# Patient Record
Sex: Female | Born: 1955 | Race: Black or African American | Hispanic: No | Smoking: Former smoker
Health system: Southern US, Community
[De-identification: ages and names within clinical notes are randomized; demographics above are authoritative.]

## PROBLEM LIST (undated history)

## (undated) DIAGNOSIS — K219 Gastro-esophageal reflux disease without esophagitis: Secondary | ICD-10-CM

## (undated) DIAGNOSIS — I10 Essential (primary) hypertension: Secondary | ICD-10-CM

## (undated) DIAGNOSIS — F1010 Alcohol abuse, uncomplicated: Secondary | ICD-10-CM

## (undated) DIAGNOSIS — K859 Acute pancreatitis without necrosis or infection, unspecified: Secondary | ICD-10-CM

## (undated) DIAGNOSIS — R569 Unspecified convulsions: Secondary | ICD-10-CM

## (undated) DIAGNOSIS — E785 Hyperlipidemia, unspecified: Secondary | ICD-10-CM

## (undated) DIAGNOSIS — I5032 Chronic diastolic (congestive) heart failure: Secondary | ICD-10-CM

## (undated) DIAGNOSIS — Z9981 Dependence on supplemental oxygen: Secondary | ICD-10-CM

## (undated) DIAGNOSIS — G4700 Insomnia, unspecified: Secondary | ICD-10-CM

## (undated) DIAGNOSIS — D510 Vitamin B12 deficiency anemia due to intrinsic factor deficiency: Secondary | ICD-10-CM

## (undated) DIAGNOSIS — A159 Respiratory tuberculosis unspecified: Secondary | ICD-10-CM

## (undated) DIAGNOSIS — E781 Pure hyperglyceridemia: Secondary | ICD-10-CM

## (undated) DIAGNOSIS — M1990 Unspecified osteoarthritis, unspecified site: Secondary | ICD-10-CM

## (undated) DIAGNOSIS — J189 Pneumonia, unspecified organism: Secondary | ICD-10-CM

## (undated) DIAGNOSIS — M25561 Pain in right knee: Secondary | ICD-10-CM

## (undated) DIAGNOSIS — D519 Vitamin B12 deficiency anemia, unspecified: Secondary | ICD-10-CM

## (undated) DIAGNOSIS — M2550 Pain in unspecified joint: Secondary | ICD-10-CM

## (undated) DIAGNOSIS — N289 Disorder of kidney and ureter, unspecified: Secondary | ICD-10-CM

## (undated) DIAGNOSIS — R6251 Failure to thrive (child): Secondary | ICD-10-CM

## (undated) DIAGNOSIS — J90 Pleural effusion, not elsewhere classified: Secondary | ICD-10-CM

## (undated) DIAGNOSIS — R8789 Other abnormal findings in specimens from female genital organs: Secondary | ICD-10-CM

## (undated) DIAGNOSIS — S065X9A Traumatic subdural hemorrhage with loss of consciousness of unspecified duration, initial encounter: Secondary | ICD-10-CM

## (undated) DIAGNOSIS — N183 Chronic kidney disease, stage 3 unspecified: Secondary | ICD-10-CM

## (undated) DIAGNOSIS — Z8719 Personal history of other diseases of the digestive system: Secondary | ICD-10-CM

## (undated) DIAGNOSIS — D696 Thrombocytopenia, unspecified: Secondary | ICD-10-CM

## (undated) DIAGNOSIS — D539 Nutritional anemia, unspecified: Secondary | ICD-10-CM

## (undated) DIAGNOSIS — R160 Hepatomegaly, not elsewhere classified: Secondary | ICD-10-CM

## (undated) DIAGNOSIS — S42213A Unspecified displaced fracture of surgical neck of unspecified humerus, initial encounter for closed fracture: Secondary | ICD-10-CM

## (undated) DIAGNOSIS — Z8669 Personal history of other diseases of the nervous system and sense organs: Secondary | ICD-10-CM

## (undated) DIAGNOSIS — IMO0001 Reserved for inherently not codable concepts without codable children: Secondary | ICD-10-CM

## (undated) DIAGNOSIS — F32A Depression, unspecified: Secondary | ICD-10-CM

## (undated) DIAGNOSIS — IMO0002 Reserved for concepts with insufficient information to code with codable children: Secondary | ICD-10-CM

## (undated) DIAGNOSIS — F329 Major depressive disorder, single episode, unspecified: Secondary | ICD-10-CM

## (undated) HISTORY — DX: Essential (primary) hypertension: I10

## (undated) HISTORY — DX: Hypocalcemia: E83.51

## (undated) HISTORY — DX: Gastro-esophageal reflux disease without esophagitis: K21.9

## (undated) HISTORY — DX: Insomnia, unspecified: G47.00

## (undated) HISTORY — DX: Pain in unspecified joint: M25.50

## (undated) HISTORY — DX: Other abnormal findings in specimens from female genital organs: R87.89

## (undated) HISTORY — DX: Acute pancreatitis without necrosis or infection, unspecified: K85.90

## (undated) HISTORY — DX: Hyperlipidemia, unspecified: E78.5

## (undated) HISTORY — DX: Pure hyperglyceridemia: E78.1

## (undated) HISTORY — DX: Hypomagnesemia: E83.42

## (undated) HISTORY — DX: Pain in right knee: M25.561

## (undated) HISTORY — DX: Personal history of other diseases of the digestive system: Z87.19

## (undated) HISTORY — DX: Vitamin B12 deficiency anemia, unspecified: D51.9

## (undated) HISTORY — DX: Major depressive disorder, single episode, unspecified: F32.9

## (undated) HISTORY — DX: Hepatomegaly, not elsewhere classified: R16.0

## (undated) HISTORY — DX: Unspecified osteoarthritis, unspecified site: M19.90

## (undated) HISTORY — DX: Alcohol abuse, uncomplicated: F10.10

## (undated) HISTORY — DX: Failure to thrive (child): R62.51

## (undated) HISTORY — DX: Traumatic subdural hemorrhage with loss of consciousness of unspecified duration, initial encounter: S06.5X9A

## (undated) HISTORY — PX: EYE SURGERY: SHX253

## (undated) HISTORY — DX: Personal history of other diseases of the nervous system and sense organs: Z86.69

## (undated) HISTORY — DX: Reserved for concepts with insufficient information to code with codable children: IMO0002

## (undated) HISTORY — DX: Vitamin B12 deficiency anemia due to intrinsic factor deficiency: D51.0

## (undated) HISTORY — DX: Thrombocytopenia, unspecified: D69.6

## (undated) HISTORY — DX: Unspecified displaced fracture of surgical neck of unspecified humerus, initial encounter for closed fracture: S42.213A

## (undated) HISTORY — DX: Depression, unspecified: F32.A

## (undated) HISTORY — DX: Nutritional anemia, unspecified: D53.9

---

## 1998-04-10 ENCOUNTER — Ambulatory Visit (HOSPITAL_COMMUNITY): Admission: RE | Admit: 1998-04-10 | Discharge: 1998-04-10 | Payer: Self-pay | Admitting: *Deleted

## 1998-04-10 ENCOUNTER — Encounter: Payer: Self-pay | Admitting: *Deleted

## 1999-08-15 ENCOUNTER — Emergency Department (HOSPITAL_COMMUNITY): Admission: EM | Admit: 1999-08-15 | Discharge: 1999-08-16 | Payer: Self-pay | Admitting: Internal Medicine

## 1999-08-15 ENCOUNTER — Encounter: Payer: Self-pay | Admitting: Internal Medicine

## 1999-08-24 ENCOUNTER — Emergency Department (HOSPITAL_COMMUNITY): Admission: EM | Admit: 1999-08-24 | Discharge: 1999-08-24 | Payer: Self-pay | Admitting: Internal Medicine

## 2000-02-27 ENCOUNTER — Emergency Department (HOSPITAL_COMMUNITY): Admission: EM | Admit: 2000-02-27 | Discharge: 2000-02-27 | Payer: Self-pay | Admitting: Emergency Medicine

## 2000-03-23 ENCOUNTER — Emergency Department (HOSPITAL_COMMUNITY): Admission: EM | Admit: 2000-03-23 | Discharge: 2000-03-23 | Payer: Self-pay | Admitting: Emergency Medicine

## 2001-01-09 ENCOUNTER — Emergency Department (HOSPITAL_COMMUNITY): Admission: EM | Admit: 2001-01-09 | Discharge: 2001-01-09 | Payer: Self-pay | Admitting: Emergency Medicine

## 2002-03-05 ENCOUNTER — Inpatient Hospital Stay (HOSPITAL_COMMUNITY): Admission: EM | Admit: 2002-03-05 | Discharge: 2002-03-07 | Payer: Self-pay | Admitting: Emergency Medicine

## 2002-03-05 ENCOUNTER — Encounter: Payer: Self-pay | Admitting: Emergency Medicine

## 2002-03-06 ENCOUNTER — Encounter: Payer: Self-pay | Admitting: Internal Medicine

## 2002-04-07 ENCOUNTER — Encounter: Admission: RE | Admit: 2002-04-07 | Discharge: 2002-04-07 | Payer: Self-pay | Admitting: Internal Medicine

## 2002-04-29 ENCOUNTER — Emergency Department (HOSPITAL_COMMUNITY): Admission: EM | Admit: 2002-04-29 | Discharge: 2002-04-29 | Payer: Self-pay | Admitting: Emergency Medicine

## 2002-04-29 ENCOUNTER — Encounter: Payer: Self-pay | Admitting: Emergency Medicine

## 2002-06-16 ENCOUNTER — Encounter: Admission: RE | Admit: 2002-06-16 | Discharge: 2002-07-15 | Payer: Self-pay | Admitting: *Deleted

## 2002-06-26 ENCOUNTER — Encounter: Admission: RE | Admit: 2002-06-26 | Discharge: 2002-06-26 | Payer: Self-pay | Admitting: Internal Medicine

## 2003-06-14 ENCOUNTER — Emergency Department (HOSPITAL_COMMUNITY): Admission: AD | Admit: 2003-06-14 | Discharge: 2003-06-14 | Payer: Self-pay | Admitting: Family Medicine

## 2003-06-29 ENCOUNTER — Emergency Department (HOSPITAL_COMMUNITY): Admission: AD | Admit: 2003-06-29 | Discharge: 2003-06-29 | Payer: Self-pay | Admitting: Family Medicine

## 2003-11-29 ENCOUNTER — Emergency Department (HOSPITAL_COMMUNITY): Admission: EM | Admit: 2003-11-29 | Discharge: 2003-11-29 | Payer: Self-pay | Admitting: *Deleted

## 2004-03-09 ENCOUNTER — Emergency Department (HOSPITAL_COMMUNITY): Admission: EM | Admit: 2004-03-09 | Discharge: 2004-03-09 | Payer: Self-pay | Admitting: Family Medicine

## 2004-03-12 ENCOUNTER — Emergency Department (HOSPITAL_COMMUNITY): Admission: EM | Admit: 2004-03-12 | Discharge: 2004-03-12 | Payer: Self-pay | Admitting: Emergency Medicine

## 2004-03-18 ENCOUNTER — Ambulatory Visit: Payer: Self-pay | Admitting: Internal Medicine

## 2004-04-20 ENCOUNTER — Ambulatory Visit: Payer: Self-pay | Admitting: Internal Medicine

## 2004-04-20 ENCOUNTER — Ambulatory Visit (HOSPITAL_COMMUNITY): Admission: RE | Admit: 2004-04-20 | Discharge: 2004-04-20 | Payer: Self-pay | Admitting: Internal Medicine

## 2004-07-18 ENCOUNTER — Ambulatory Visit: Payer: Self-pay | Admitting: Internal Medicine

## 2004-08-02 ENCOUNTER — Ambulatory Visit (HOSPITAL_COMMUNITY): Admission: RE | Admit: 2004-08-02 | Discharge: 2004-08-02 | Payer: Self-pay | Admitting: Internal Medicine

## 2004-08-12 ENCOUNTER — Encounter: Admission: RE | Admit: 2004-08-12 | Discharge: 2004-08-12 | Payer: Self-pay | Admitting: Internal Medicine

## 2004-10-20 ENCOUNTER — Ambulatory Visit: Payer: Self-pay | Admitting: Internal Medicine

## 2004-10-20 ENCOUNTER — Ambulatory Visit (HOSPITAL_COMMUNITY): Admission: RE | Admit: 2004-10-20 | Discharge: 2004-10-20 | Payer: Self-pay | Admitting: Internal Medicine

## 2004-11-17 ENCOUNTER — Ambulatory Visit: Payer: Self-pay | Admitting: Internal Medicine

## 2005-03-17 ENCOUNTER — Encounter: Admission: RE | Admit: 2005-03-17 | Discharge: 2005-03-17 | Payer: Self-pay | Admitting: Internal Medicine

## 2005-03-30 ENCOUNTER — Emergency Department (HOSPITAL_COMMUNITY): Admission: EM | Admit: 2005-03-30 | Discharge: 2005-03-30 | Payer: Self-pay | Admitting: Family Medicine

## 2005-04-05 ENCOUNTER — Ambulatory Visit: Payer: Self-pay | Admitting: Internal Medicine

## 2005-11-29 ENCOUNTER — Ambulatory Visit: Payer: Self-pay | Admitting: Internal Medicine

## 2005-12-13 ENCOUNTER — Ambulatory Visit: Payer: Self-pay | Admitting: Hospitalist

## 2005-12-22 ENCOUNTER — Ambulatory Visit: Payer: Self-pay | Admitting: Internal Medicine

## 2006-01-03 ENCOUNTER — Ambulatory Visit: Payer: Self-pay | Admitting: Internal Medicine

## 2006-01-03 ENCOUNTER — Ambulatory Visit (HOSPITAL_COMMUNITY): Admission: RE | Admit: 2006-01-03 | Discharge: 2006-01-03 | Payer: Self-pay | Admitting: Internal Medicine

## 2006-01-09 ENCOUNTER — Ambulatory Visit: Payer: Self-pay | Admitting: Hospitalist

## 2006-01-16 ENCOUNTER — Encounter (INDEPENDENT_AMBULATORY_CARE_PROVIDER_SITE_OTHER): Payer: Self-pay | Admitting: Cardiology

## 2006-01-16 ENCOUNTER — Ambulatory Visit (HOSPITAL_COMMUNITY): Admission: RE | Admit: 2006-01-16 | Discharge: 2006-01-16 | Payer: Self-pay | Admitting: Internal Medicine

## 2006-01-25 ENCOUNTER — Encounter: Admission: RE | Admit: 2006-01-25 | Discharge: 2006-01-25 | Payer: Self-pay | Admitting: Internal Medicine

## 2006-01-31 ENCOUNTER — Ambulatory Visit: Payer: Self-pay | Admitting: Hospitalist

## 2006-03-02 ENCOUNTER — Ambulatory Visit: Payer: Self-pay | Admitting: Internal Medicine

## 2006-03-06 ENCOUNTER — Emergency Department (HOSPITAL_COMMUNITY): Admission: EM | Admit: 2006-03-06 | Discharge: 2006-03-06 | Payer: Self-pay | Admitting: Emergency Medicine

## 2006-03-19 ENCOUNTER — Ambulatory Visit: Payer: Self-pay | Admitting: Internal Medicine

## 2006-04-02 ENCOUNTER — Ambulatory Visit: Payer: Self-pay | Admitting: Internal Medicine

## 2006-05-01 ENCOUNTER — Ambulatory Visit: Payer: Self-pay | Admitting: Internal Medicine

## 2006-05-04 ENCOUNTER — Emergency Department (HOSPITAL_COMMUNITY): Admission: EM | Admit: 2006-05-04 | Discharge: 2006-05-04 | Payer: Self-pay | Admitting: Emergency Medicine

## 2006-05-10 ENCOUNTER — Ambulatory Visit: Payer: Self-pay | Admitting: Internal Medicine

## 2006-05-10 ENCOUNTER — Encounter (INDEPENDENT_AMBULATORY_CARE_PROVIDER_SITE_OTHER): Payer: Self-pay | Admitting: Internal Medicine

## 2006-05-10 LAB — CONVERTED CEMR LAB
Alkaline Phosphatase: 131 units/L — ABNORMAL HIGH (ref 39–117)
BUN: 7 mg/dL (ref 6–23)
Candida species: NEGATIVE
Eosinophils Relative: 2 % (ref 0–4)
Glucose, Bld: 142 mg/dL — ABNORMAL HIGH (ref 70–99)
HCT: 32.5 % — ABNORMAL LOW (ref 34.4–43.3)
HIV 1 RNA Quant: <50 copies/mL (ref <50)
Hemoglobin: 10.9 g/dL — ABNORMAL LOW (ref 11.7–14.8)
Lymphocytes Relative: 30 % (ref 15–43)
Lymphs Abs: 1.6 10*3/uL (ref 0.8–3.1)
Monocytes Absolute: 1 10*3/uL — ABNORMAL HIGH (ref 0.2–0.7)
RDW: 13.1 % (ref 11.5–15.3)
Total Bilirubin: 0.3 mg/dL (ref 0.3–1.2)
Trichomonal Vaginitis: NEGATIVE

## 2006-06-01 ENCOUNTER — Ambulatory Visit: Payer: Self-pay | Admitting: Internal Medicine

## 2006-07-02 ENCOUNTER — Ambulatory Visit: Payer: Self-pay | Admitting: Internal Medicine

## 2006-07-02 DIAGNOSIS — R160 Hepatomegaly, not elsewhere classified: Secondary | ICD-10-CM

## 2006-07-02 DIAGNOSIS — F1010 Alcohol abuse, uncomplicated: Secondary | ICD-10-CM | POA: Insufficient documentation

## 2006-08-01 ENCOUNTER — Encounter (INDEPENDENT_AMBULATORY_CARE_PROVIDER_SITE_OTHER): Payer: Self-pay | Admitting: *Deleted

## 2006-08-01 ENCOUNTER — Ambulatory Visit: Payer: Self-pay | Admitting: Internal Medicine

## 2006-08-07 ENCOUNTER — Emergency Department (HOSPITAL_COMMUNITY): Admission: EM | Admit: 2006-08-07 | Discharge: 2006-08-07 | Payer: Self-pay | Admitting: Emergency Medicine

## 2006-09-12 ENCOUNTER — Ambulatory Visit: Payer: Self-pay | Admitting: Internal Medicine

## 2006-09-12 DIAGNOSIS — F329 Major depressive disorder, single episode, unspecified: Secondary | ICD-10-CM | POA: Insufficient documentation

## 2006-09-12 DIAGNOSIS — D649 Anemia, unspecified: Secondary | ICD-10-CM

## 2006-09-13 ENCOUNTER — Encounter (INDEPENDENT_AMBULATORY_CARE_PROVIDER_SITE_OTHER): Payer: Self-pay | Admitting: Internal Medicine

## 2006-09-17 ENCOUNTER — Encounter (INDEPENDENT_AMBULATORY_CARE_PROVIDER_SITE_OTHER): Payer: Self-pay | Admitting: Internal Medicine

## 2006-09-17 DIAGNOSIS — D696 Thrombocytopenia, unspecified: Secondary | ICD-10-CM

## 2006-09-17 LAB — CONVERTED CEMR LAB
AST: 61 units/L — ABNORMAL HIGH (ref 0–37)
Albumin: 3.7 g/dL (ref 3.5–5.2)
Alkaline Phosphatase: 116 units/L (ref 39–117)
BUN: 6 mg/dL (ref 6–23)
MCHC: 34.5 g/dL (ref 30.0–36.0)
Potassium: 3.8 meq/L (ref 3.5–5.3)
RDW: 16.3 % — ABNORMAL HIGH (ref 11.5–14.0)
Sodium: 141 meq/L (ref 135–145)
Total Protein: 8.3 g/dL (ref 6.0–8.3)

## 2006-09-18 ENCOUNTER — Encounter (INDEPENDENT_AMBULATORY_CARE_PROVIDER_SITE_OTHER): Payer: Self-pay | Admitting: Internal Medicine

## 2006-09-19 ENCOUNTER — Encounter (INDEPENDENT_AMBULATORY_CARE_PROVIDER_SITE_OTHER): Payer: Self-pay | Admitting: Internal Medicine

## 2006-09-19 ENCOUNTER — Ambulatory Visit: Payer: Self-pay | Admitting: Hospitalist

## 2006-09-19 ENCOUNTER — Ambulatory Visit (HOSPITAL_COMMUNITY): Admission: RE | Admit: 2006-09-19 | Discharge: 2006-09-19 | Payer: Self-pay | Admitting: Internal Medicine

## 2006-09-20 ENCOUNTER — Ambulatory Visit: Payer: Self-pay | Admitting: Internal Medicine

## 2006-09-24 LAB — CONVERTED CEMR LAB
Ferritin: 487 ng/mL — ABNORMAL HIGH (ref 10–291)
Hemoglobin: 10.9 g/dL — ABNORMAL LOW (ref 12.0–15.0)
INR: 1.1 (ref 0.0–1.5)
MCHC: 33.8 g/dL (ref 30.0–36.0)
RBC Folate: 137 ng/mL — ABNORMAL LOW (ref 180–600)
RBC: 2.9 M/uL — ABNORMAL LOW (ref 3.87–5.11)
TIBC: 255 ug/dL (ref 250–470)
Vitamin B-12: 562 pg/mL (ref 211–911)
WBC: 4 10*3/uL (ref 4.0–10.5)

## 2006-10-10 ENCOUNTER — Ambulatory Visit: Payer: Self-pay | Admitting: *Deleted

## 2006-11-09 ENCOUNTER — Ambulatory Visit: Payer: Self-pay | Admitting: Internal Medicine

## 2006-11-27 ENCOUNTER — Emergency Department (HOSPITAL_COMMUNITY): Admission: EM | Admit: 2006-11-27 | Discharge: 2006-11-27 | Payer: Self-pay | Admitting: Family Medicine

## 2006-12-13 ENCOUNTER — Ambulatory Visit: Payer: Self-pay | Admitting: Internal Medicine

## 2007-01-14 ENCOUNTER — Ambulatory Visit: Payer: Self-pay | Admitting: *Deleted

## 2007-02-13 ENCOUNTER — Ambulatory Visit: Payer: Self-pay | Admitting: Internal Medicine

## 2007-02-26 ENCOUNTER — Telehealth (INDEPENDENT_AMBULATORY_CARE_PROVIDER_SITE_OTHER): Payer: Self-pay | Admitting: *Deleted

## 2007-03-04 ENCOUNTER — Encounter (INDEPENDENT_AMBULATORY_CARE_PROVIDER_SITE_OTHER): Payer: Self-pay | Admitting: Internal Medicine

## 2007-03-04 ENCOUNTER — Ambulatory Visit: Payer: Self-pay | Admitting: Internal Medicine

## 2007-03-07 ENCOUNTER — Ambulatory Visit: Payer: Self-pay | Admitting: Infectious Diseases

## 2007-03-11 LAB — CONVERTED CEMR LAB
ALT: 49 units/L — ABNORMAL HIGH (ref 0–35)
AST: 180 units/L — ABNORMAL HIGH (ref 0–37)
Basophils Absolute: 0 10*3/uL (ref 0.0–0.1)
Basophils Relative: 1 % (ref 0–1)
CO2: 24 meq/L (ref 19–32)
Calcium: 8.7 mg/dL (ref 8.4–10.5)
Chloride: 109 meq/L (ref 96–112)
Hemoglobin: 10.6 g/dL — ABNORMAL LOW (ref 12.0–15.0)
Lymphocytes Relative: 52 % — ABNORMAL HIGH (ref 12–46)
Monocytes Absolute: 0.5 10*3/uL (ref 0.2–0.7)
Neutro Abs: 1.8 10*3/uL (ref 1.7–7.7)
Neutrophils Relative %: 36 % — ABNORMAL LOW (ref 43–77)
Platelets: 195 10*3/uL (ref 150–400)
Potassium: 4.4 meq/L (ref 3.5–5.3)
RBC Folate: 272 ng/mL (ref 180–600)
RDW: 13.1 % (ref 11.5–14.0)
Sodium: 140 meq/L (ref 135–145)
Total Protein: 8.2 g/dL (ref 6.0–8.3)

## 2007-03-13 ENCOUNTER — Ambulatory Visit: Payer: Self-pay | Admitting: Infectious Diseases

## 2007-03-19 ENCOUNTER — Encounter: Admission: RE | Admit: 2007-03-19 | Discharge: 2007-03-19 | Payer: Self-pay | Admitting: Internal Medicine

## 2007-04-05 ENCOUNTER — Ambulatory Visit: Payer: Self-pay | Admitting: Internal Medicine

## 2007-04-05 ENCOUNTER — Encounter (INDEPENDENT_AMBULATORY_CARE_PROVIDER_SITE_OTHER): Payer: Self-pay | Admitting: Internal Medicine

## 2007-04-05 DIAGNOSIS — I129 Hypertensive chronic kidney disease with stage 1 through stage 4 chronic kidney disease, or unspecified chronic kidney disease: Secondary | ICD-10-CM

## 2007-04-05 LAB — CONVERTED CEMR LAB
ALT: 24 units/L (ref 0–35)
AST: 101 units/L — ABNORMAL HIGH (ref 0–37)
Alcohol, Ethyl (B): <5 mg/dL (ref 0–10)
BUN: 3 mg/dL — ABNORMAL LOW (ref 6–23)
Creatinine, Ser: 0.52 mg/dL (ref 0.40–1.20)
Total Bilirubin: 0.8 mg/dL (ref 0.3–1.2)

## 2007-04-12 ENCOUNTER — Ambulatory Visit: Payer: Self-pay | Admitting: *Deleted

## 2007-05-05 ENCOUNTER — Emergency Department (HOSPITAL_COMMUNITY): Admission: EM | Admit: 2007-05-05 | Discharge: 2007-05-05 | Payer: Self-pay | Admitting: Family Medicine

## 2007-05-06 ENCOUNTER — Telehealth: Payer: Self-pay | Admitting: *Deleted

## 2007-05-06 ENCOUNTER — Encounter (INDEPENDENT_AMBULATORY_CARE_PROVIDER_SITE_OTHER): Payer: Self-pay | Admitting: Internal Medicine

## 2007-05-06 ENCOUNTER — Ambulatory Visit: Payer: Self-pay | Admitting: Hospitalist

## 2007-05-10 ENCOUNTER — Encounter (INDEPENDENT_AMBULATORY_CARE_PROVIDER_SITE_OTHER): Payer: Self-pay | Admitting: Internal Medicine

## 2007-05-16 ENCOUNTER — Ambulatory Visit: Payer: Self-pay | Admitting: Internal Medicine

## 2007-05-16 ENCOUNTER — Encounter (INDEPENDENT_AMBULATORY_CARE_PROVIDER_SITE_OTHER): Payer: Self-pay | Admitting: Internal Medicine

## 2007-06-12 ENCOUNTER — Ambulatory Visit: Payer: Self-pay | Admitting: Internal Medicine

## 2007-06-12 ENCOUNTER — Encounter (INDEPENDENT_AMBULATORY_CARE_PROVIDER_SITE_OTHER): Payer: Self-pay | Admitting: Internal Medicine

## 2007-06-12 ENCOUNTER — Telehealth (INDEPENDENT_AMBULATORY_CARE_PROVIDER_SITE_OTHER): Payer: Self-pay | Admitting: Internal Medicine

## 2007-06-28 ENCOUNTER — Ambulatory Visit: Payer: Self-pay | Admitting: Internal Medicine

## 2007-06-28 ENCOUNTER — Encounter (INDEPENDENT_AMBULATORY_CARE_PROVIDER_SITE_OTHER): Payer: Self-pay | Admitting: Internal Medicine

## 2007-07-04 LAB — CONVERTED CEMR LAB
AST: 123 units/L — ABNORMAL HIGH (ref 0–37)
Albumin: 3.3 g/dL — ABNORMAL LOW (ref 3.5–5.2)
BUN: 5 mg/dL — ABNORMAL LOW (ref 6–23)
Calcium: <6.5 mg/dL — ABNORMAL LOW (ref 8.4–10.5)
Chloride: 98 meq/L (ref 96–112)
Creatinine, Ser: 0.52 mg/dL (ref 0.40–1.20)
Glucose, Bld: 89 mg/dL (ref 70–99)
HCT: 32.9 % — ABNORMAL LOW (ref 36.0–46.0)
Hemoglobin: 11.1 g/dL — ABNORMAL LOW (ref 12.0–15.0)
MCHC: 33.7 g/dL (ref 30.0–36.0)
Potassium: 3.6 meq/L (ref 3.5–5.3)
RBC: 3.13 M/uL — ABNORMAL LOW (ref 3.87–5.11)
RDW: 13.1 % (ref 11.5–15.5)

## 2007-07-08 ENCOUNTER — Telehealth: Payer: Self-pay | Admitting: *Deleted

## 2007-08-15 ENCOUNTER — Encounter (INDEPENDENT_AMBULATORY_CARE_PROVIDER_SITE_OTHER): Payer: Self-pay | Admitting: Internal Medicine

## 2007-08-15 ENCOUNTER — Ambulatory Visit: Payer: Self-pay | Admitting: *Deleted

## 2007-08-15 ENCOUNTER — Ambulatory Visit (HOSPITAL_COMMUNITY): Admission: RE | Admit: 2007-08-15 | Discharge: 2007-08-15 | Payer: Self-pay | Admitting: *Deleted

## 2007-08-15 LAB — CONVERTED CEMR LAB
ALT: 177 units/L — ABNORMAL HIGH (ref 0–35)
Albumin: 2.9 g/dL — ABNORMAL LOW (ref 3.5–5.2)
Alkaline Phosphatase: 347 units/L — ABNORMAL HIGH (ref 39–117)
CO2: 22 meq/L (ref 19–32)
Eosinophils Relative: 0 % (ref 0–5)
Glucose, Bld: 107 mg/dL — ABNORMAL HIGH (ref 70–99)
Hep B Core Total Ab: NEGATIVE
Lymphocytes Relative: 14 % (ref 12–46)
Lymphs Abs: 3 10*3/uL (ref 0.7–4.0)
MCHC: 34.8 g/dL (ref 30.0–36.0)
MCV: 107.9 fL — ABNORMAL HIGH (ref 78.0–100.0)
Monocytes Absolute: 0.8 10*3/uL (ref 0.1–1.0)
Potassium: 4 meq/L (ref 3.5–5.3)
Prothrombin Time: 13.7 s (ref 11.6–15.2)
RDW: 13.9 % (ref 11.5–15.5)
Sodium: 134 meq/L — ABNORMAL LOW (ref 135–145)
Total Protein: 7.9 g/dL (ref 6.0–8.3)

## 2007-08-16 ENCOUNTER — Ambulatory Visit (HOSPITAL_COMMUNITY): Admission: RE | Admit: 2007-08-16 | Discharge: 2007-08-16 | Payer: Self-pay | Admitting: Infectious Diseases

## 2007-08-16 LAB — CONVERTED CEMR LAB
Ketones, ur: NEGATIVE mg/dL
Nitrite: NEGATIVE
Specific Gravity, Urine: 1.019 (ref 1.005–1.03)
Urobilinogen, UA: 1 (ref 0.0–1.0)
pH: 6.5 (ref 5.0–8.0)

## 2007-08-17 ENCOUNTER — Encounter (INDEPENDENT_AMBULATORY_CARE_PROVIDER_SITE_OTHER): Payer: Self-pay | Admitting: Internal Medicine

## 2007-08-19 DIAGNOSIS — R627 Adult failure to thrive: Secondary | ICD-10-CM | POA: Insufficient documentation

## 2007-08-21 ENCOUNTER — Telehealth: Payer: Self-pay | Admitting: *Deleted

## 2007-08-27 ENCOUNTER — Ambulatory Visit: Payer: Self-pay | Admitting: Internal Medicine

## 2007-08-29 ENCOUNTER — Ambulatory Visit: Payer: Self-pay | Admitting: *Deleted

## 2007-08-29 ENCOUNTER — Encounter (INDEPENDENT_AMBULATORY_CARE_PROVIDER_SITE_OTHER): Payer: Self-pay | Admitting: Internal Medicine

## 2007-08-29 LAB — CONVERTED CEMR LAB
ALT: 63 units/L — ABNORMAL HIGH (ref 0–35)
BUN: 14 mg/dL (ref 6–23)
CO2: 21 meq/L (ref 19–32)
Calcium: 8 mg/dL — ABNORMAL LOW (ref 8.4–10.5)
Creatinine, Ser: 0.92 mg/dL (ref 0.40–1.20)
Glucose, Bld: 162 mg/dL — ABNORMAL HIGH (ref 70–99)
HCT: 30.4 % — ABNORMAL LOW (ref 36.0–46.0)
Hemoglobin: 10.6 g/dL — ABNORMAL LOW (ref 12.0–15.0)
MCV: 107 fL — ABNORMAL HIGH (ref 78.0–100.0)
Platelets: 258 10*3/uL (ref 150–400)
Total Bilirubin: 0.8 mg/dL (ref 0.3–1.2)
WBC: 6.1 10*3/uL (ref 4.0–10.5)

## 2007-09-16 ENCOUNTER — Ambulatory Visit: Payer: Self-pay | Admitting: Internal Medicine

## 2007-10-03 ENCOUNTER — Encounter (INDEPENDENT_AMBULATORY_CARE_PROVIDER_SITE_OTHER): Payer: Self-pay | Admitting: Internal Medicine

## 2007-10-03 ENCOUNTER — Ambulatory Visit: Payer: Self-pay | Admitting: Internal Medicine

## 2007-10-04 LAB — CONVERTED CEMR LAB
BUN: 12 mg/dL (ref 6–23)
CO2: 18 meq/L — ABNORMAL LOW (ref 19–32)
Calcium: 8 mg/dL — ABNORMAL LOW (ref 8.4–10.5)
Chloride: 109 meq/L (ref 96–112)
Creatinine, Ser: 1.17 mg/dL (ref 0.40–1.20)
Glucose, Bld: 146 mg/dL — ABNORMAL HIGH (ref 70–99)
HCT: 35.2 % — ABNORMAL LOW (ref 36.0–46.0)
Hemoglobin: 11.7 g/dL — ABNORMAL LOW (ref 12.0–15.0)
MCV: 105.1 fL — ABNORMAL HIGH (ref 78.0–100.0)
RBC: 3.35 M/uL — ABNORMAL LOW (ref 3.87–5.11)
Total Bilirubin: 0.3 mg/dL (ref 0.3–1.2)
WBC: 6.7 10*3/uL (ref 4.0–10.5)

## 2007-10-16 ENCOUNTER — Ambulatory Visit: Payer: Self-pay | Admitting: *Deleted

## 2007-11-15 ENCOUNTER — Ambulatory Visit: Payer: Self-pay | Admitting: *Deleted

## 2007-11-25 ENCOUNTER — Emergency Department (HOSPITAL_COMMUNITY): Admission: EM | Admit: 2007-11-25 | Discharge: 2007-11-25 | Payer: Self-pay | Admitting: Emergency Medicine

## 2007-11-25 ENCOUNTER — Encounter: Payer: Self-pay | Admitting: Emergency Medicine

## 2007-11-26 ENCOUNTER — Encounter: Payer: Self-pay | Admitting: Emergency Medicine

## 2007-11-26 ENCOUNTER — Inpatient Hospital Stay (HOSPITAL_COMMUNITY): Admission: AD | Admit: 2007-11-26 | Discharge: 2007-12-02 | Payer: Self-pay | Admitting: Infectious Diseases

## 2007-12-02 ENCOUNTER — Telehealth (INDEPENDENT_AMBULATORY_CARE_PROVIDER_SITE_OTHER): Payer: Self-pay | Admitting: Internal Medicine

## 2007-12-04 ENCOUNTER — Encounter (INDEPENDENT_AMBULATORY_CARE_PROVIDER_SITE_OTHER): Payer: Self-pay | Admitting: *Deleted

## 2007-12-04 ENCOUNTER — Ambulatory Visit: Payer: Self-pay | Admitting: Internal Medicine

## 2007-12-04 DIAGNOSIS — N2581 Secondary hyperparathyroidism of renal origin: Secondary | ICD-10-CM | POA: Insufficient documentation

## 2007-12-04 DIAGNOSIS — Z8669 Personal history of other diseases of the nervous system and sense organs: Secondary | ICD-10-CM

## 2007-12-04 LAB — CONVERTED CEMR LAB
CO2: 21 meq/L (ref 19–32)
Calcium: 8.6 mg/dL (ref 8.4–10.5)
Glucose, Bld: 123 mg/dL — ABNORMAL HIGH (ref 70–99)
Potassium: 3.8 meq/L (ref 3.5–5.3)
Sodium: 140 meq/L (ref 135–145)

## 2007-12-06 ENCOUNTER — Ambulatory Visit: Payer: Self-pay | Admitting: Internal Medicine

## 2007-12-06 ENCOUNTER — Inpatient Hospital Stay (HOSPITAL_COMMUNITY): Admission: EM | Admit: 2007-12-06 | Discharge: 2007-12-07 | Payer: Self-pay | Admitting: *Deleted

## 2007-12-07 ENCOUNTER — Encounter (INDEPENDENT_AMBULATORY_CARE_PROVIDER_SITE_OTHER): Payer: Self-pay | Admitting: *Deleted

## 2007-12-12 ENCOUNTER — Telehealth (INDEPENDENT_AMBULATORY_CARE_PROVIDER_SITE_OTHER): Payer: Self-pay | Admitting: Internal Medicine

## 2007-12-25 ENCOUNTER — Ambulatory Visit: Payer: Self-pay | Admitting: Internal Medicine

## 2008-01-01 ENCOUNTER — Ambulatory Visit: Payer: Self-pay | Admitting: Internal Medicine

## 2008-01-31 ENCOUNTER — Ambulatory Visit: Payer: Self-pay | Admitting: Internal Medicine

## 2008-02-04 DIAGNOSIS — S065X9A Traumatic subdural hemorrhage with loss of consciousness of unspecified duration, initial encounter: Secondary | ICD-10-CM

## 2008-02-04 DIAGNOSIS — S065XAA Traumatic subdural hemorrhage with loss of consciousness status unknown, initial encounter: Secondary | ICD-10-CM

## 2008-02-04 HISTORY — DX: Traumatic subdural hemorrhage with loss of consciousness status unknown, initial encounter: S06.5XAA

## 2008-02-04 HISTORY — DX: Traumatic subdural hemorrhage with loss of consciousness of unspecified duration, initial encounter: S06.5X9A

## 2008-02-06 ENCOUNTER — Emergency Department (HOSPITAL_COMMUNITY): Admission: EM | Admit: 2008-02-06 | Discharge: 2008-02-06 | Payer: Self-pay | Admitting: Emergency Medicine

## 2008-02-13 ENCOUNTER — Ambulatory Visit: Payer: Self-pay | Admitting: Internal Medicine

## 2008-02-13 ENCOUNTER — Encounter (INDEPENDENT_AMBULATORY_CARE_PROVIDER_SITE_OTHER): Payer: Self-pay | Admitting: Internal Medicine

## 2008-02-13 ENCOUNTER — Ambulatory Visit (HOSPITAL_COMMUNITY): Admission: RE | Admit: 2008-02-13 | Discharge: 2008-02-13 | Payer: Self-pay | Admitting: Internal Medicine

## 2008-02-13 DIAGNOSIS — I6200 Nontraumatic subdural hemorrhage, unspecified: Secondary | ICD-10-CM

## 2008-02-13 LAB — CONVERTED CEMR LAB
HCT: 33.5 % — ABNORMAL LOW (ref 36.0–46.0)
Hemoglobin: 11.3 g/dL — ABNORMAL LOW (ref 12.0–15.0)
MCHC: 33.8 g/dL (ref 30.0–36.0)
RBC: 3.37 M/uL — ABNORMAL LOW (ref 3.87–5.11)
RDW: 13.9 % (ref 11.5–15.5)

## 2008-02-24 ENCOUNTER — Encounter: Admission: RE | Admit: 2008-02-24 | Discharge: 2008-02-24 | Payer: Self-pay | Admitting: Neurosurgery

## 2008-02-24 ENCOUNTER — Encounter (INDEPENDENT_AMBULATORY_CARE_PROVIDER_SITE_OTHER): Payer: Self-pay | Admitting: Internal Medicine

## 2008-03-02 ENCOUNTER — Ambulatory Visit: Payer: Self-pay | Admitting: Internal Medicine

## 2008-03-02 DIAGNOSIS — K219 Gastro-esophageal reflux disease without esophagitis: Secondary | ICD-10-CM

## 2008-03-02 DIAGNOSIS — E559 Vitamin D deficiency, unspecified: Secondary | ICD-10-CM | POA: Insufficient documentation

## 2008-03-19 ENCOUNTER — Ambulatory Visit: Payer: Self-pay | Admitting: Internal Medicine

## 2008-03-20 ENCOUNTER — Encounter: Admission: RE | Admit: 2008-03-20 | Discharge: 2008-03-20 | Payer: Self-pay | Admitting: Internal Medicine

## 2008-03-26 LAB — CONVERTED CEMR LAB
AST: 15 units/L (ref 0–37)
Alkaline Phosphatase: 95 units/L (ref 39–117)
BUN: 15 mg/dL (ref 6–23)
Basophils Absolute: 0 10*3/uL (ref 0.0–0.1)
Basophils Relative: 1 % (ref 0–1)
Creatinine, Ser: 0.91 mg/dL (ref 0.40–1.20)
Eosinophils Absolute: 0.1 10*3/uL (ref 0.0–0.7)
Glucose, Bld: 114 mg/dL — ABNORMAL HIGH (ref 70–99)
HDL: 32 mg/dL — ABNORMAL LOW (ref >39)
Hemoglobin: 11.6 g/dL — ABNORMAL LOW (ref 12.0–15.0)
LDL Cholesterol: 61 mg/dL (ref 0–99)
MCHC: 32.4 g/dL (ref 30.0–36.0)
MCV: 100.8 fL — ABNORMAL HIGH (ref 78.0–100.0)
Magnesium: 1.2 mg/dL — ABNORMAL LOW (ref 1.5–2.5)
Monocytes Absolute: 1.1 10*3/uL — ABNORMAL HIGH (ref 0.1–1.0)
Monocytes Relative: 16 % — ABNORMAL HIGH (ref 3–12)
Neutro Abs: 3.3 10*3/uL (ref 1.7–7.7)
RBC: 3.55 M/uL — ABNORMAL LOW (ref 3.87–5.11)
RDW: 13.8 % (ref 11.5–15.5)
Total Bilirubin: 0.4 mg/dL (ref 0.3–1.2)
Total CHOL/HDL Ratio: 5.3
Triglycerides: 375 mg/dL — ABNORMAL HIGH (ref <150)
VLDL: 75 mg/dL — ABNORMAL HIGH (ref 0–40)

## 2008-03-27 ENCOUNTER — Encounter (INDEPENDENT_AMBULATORY_CARE_PROVIDER_SITE_OTHER): Payer: Self-pay | Admitting: Internal Medicine

## 2008-03-27 ENCOUNTER — Ambulatory Visit: Payer: Self-pay | Admitting: Internal Medicine

## 2008-03-27 LAB — CONVERTED CEMR LAB
BUN: 14 mg/dL (ref 6–23)
CO2: 22 meq/L (ref 19–32)
Calcium: 9.4 mg/dL (ref 8.4–10.5)
Creatinine, Ser: 1.18 mg/dL (ref 0.40–1.20)
Glucose, Bld: 109 mg/dL — ABNORMAL HIGH (ref 70–99)
Sodium: 136 meq/L (ref 135–145)

## 2008-04-01 ENCOUNTER — Encounter: Admission: RE | Admit: 2008-04-01 | Discharge: 2008-04-01 | Payer: Self-pay | Admitting: Neurosurgery

## 2008-04-07 ENCOUNTER — Encounter (INDEPENDENT_AMBULATORY_CARE_PROVIDER_SITE_OTHER): Payer: Self-pay | Admitting: Internal Medicine

## 2008-04-16 ENCOUNTER — Encounter (INDEPENDENT_AMBULATORY_CARE_PROVIDER_SITE_OTHER): Payer: Self-pay | Admitting: Internal Medicine

## 2008-04-20 ENCOUNTER — Encounter (INDEPENDENT_AMBULATORY_CARE_PROVIDER_SITE_OTHER): Payer: Self-pay | Admitting: Internal Medicine

## 2008-04-20 ENCOUNTER — Ambulatory Visit: Payer: Self-pay | Admitting: Internal Medicine

## 2008-06-02 ENCOUNTER — Ambulatory Visit: Payer: Self-pay | Admitting: Infectious Diseases

## 2008-06-02 ENCOUNTER — Encounter: Admission: RE | Admit: 2008-06-02 | Discharge: 2008-06-02 | Payer: Self-pay | Admitting: Neurosurgery

## 2008-06-12 ENCOUNTER — Encounter (INDEPENDENT_AMBULATORY_CARE_PROVIDER_SITE_OTHER): Payer: Self-pay | Admitting: Internal Medicine

## 2008-06-15 ENCOUNTER — Encounter (INDEPENDENT_AMBULATORY_CARE_PROVIDER_SITE_OTHER): Payer: Self-pay | Admitting: Internal Medicine

## 2008-07-02 ENCOUNTER — Ambulatory Visit: Payer: Self-pay | Admitting: Internal Medicine

## 2008-07-08 ENCOUNTER — Ambulatory Visit: Payer: Self-pay | Admitting: *Deleted

## 2008-07-23 DIAGNOSIS — R8789 Other abnormal findings in specimens from female genital organs: Secondary | ICD-10-CM

## 2008-07-23 HISTORY — DX: Other abnormal findings in specimens from female genital organs: R87.89

## 2008-08-05 ENCOUNTER — Ambulatory Visit: Payer: Self-pay | Admitting: *Deleted

## 2008-09-11 ENCOUNTER — Telehealth (INDEPENDENT_AMBULATORY_CARE_PROVIDER_SITE_OTHER): Payer: Self-pay | Admitting: Internal Medicine

## 2008-09-11 ENCOUNTER — Ambulatory Visit: Payer: Self-pay | Admitting: Internal Medicine

## 2008-11-03 ENCOUNTER — Ambulatory Visit: Payer: Self-pay | Admitting: Internal Medicine

## 2008-11-03 DIAGNOSIS — E781 Pure hyperglyceridemia: Secondary | ICD-10-CM

## 2008-11-05 LAB — CONVERTED CEMR LAB
Cholesterol: 159 mg/dL (ref 0–200)
Magnesium: 1.3 mg/dL — ABNORMAL LOW (ref 1.5–2.5)
Total CHOL/HDL Ratio: 4.7
Vit D, 25-Hydroxy: 8 ng/mL — ABNORMAL LOW (ref 30–89)

## 2008-12-30 ENCOUNTER — Ambulatory Visit: Payer: Self-pay | Admitting: Internal Medicine

## 2009-01-27 ENCOUNTER — Emergency Department (HOSPITAL_COMMUNITY): Admission: EM | Admit: 2009-01-27 | Discharge: 2009-01-27 | Payer: Self-pay | Admitting: Family Medicine

## 2009-01-29 ENCOUNTER — Ambulatory Visit: Payer: Self-pay | Admitting: Internal Medicine

## 2009-03-01 ENCOUNTER — Ambulatory Visit: Payer: Self-pay | Admitting: Infectious Diseases

## 2009-03-05 ENCOUNTER — Ambulatory Visit: Payer: Self-pay | Admitting: Internal Medicine

## 2009-03-22 ENCOUNTER — Encounter: Admission: RE | Admit: 2009-03-22 | Discharge: 2009-03-22 | Payer: Self-pay | Admitting: Internal Medicine

## 2009-04-01 ENCOUNTER — Ambulatory Visit: Payer: Self-pay | Admitting: Internal Medicine

## 2009-04-01 LAB — CONVERTED CEMR LAB
ALT: 26 units/L (ref 0–35)
AST: 81 units/L — ABNORMAL HIGH (ref 0–37)
Calcium: 8.6 mg/dL (ref 8.4–10.5)
Chloride: 109 meq/L (ref 96–112)
Creatinine, Ser: 0.79 mg/dL (ref 0.40–1.20)
Sodium: 140 meq/L (ref 135–145)
Total Protein: 7.7 g/dL (ref 6.0–8.3)

## 2009-04-22 ENCOUNTER — Ambulatory Visit: Payer: Self-pay | Admitting: Internal Medicine

## 2009-04-22 DIAGNOSIS — M25569 Pain in unspecified knee: Secondary | ICD-10-CM

## 2009-04-22 LAB — FECAL OCCULT BLOOD, GUAIAC: Fecal Occult Blood: NEGATIVE

## 2009-04-22 LAB — CONVERTED CEMR LAB
OCCULT 2: NEGATIVE
OCCULT 3: NEGATIVE

## 2009-04-22 LAB — HM PAP SMEAR

## 2009-05-01 ENCOUNTER — Ambulatory Visit: Payer: Self-pay | Admitting: Internal Medicine

## 2009-05-01 ENCOUNTER — Encounter (INDEPENDENT_AMBULATORY_CARE_PROVIDER_SITE_OTHER): Payer: Self-pay | Admitting: Internal Medicine

## 2009-05-01 ENCOUNTER — Inpatient Hospital Stay (HOSPITAL_COMMUNITY): Admission: EM | Admit: 2009-05-01 | Discharge: 2009-05-08 | Payer: Self-pay | Admitting: Emergency Medicine

## 2009-05-08 ENCOUNTER — Encounter: Payer: Self-pay | Admitting: Internal Medicine

## 2009-05-18 ENCOUNTER — Ambulatory Visit: Payer: Self-pay | Admitting: Internal Medicine

## 2009-05-18 DIAGNOSIS — K861 Other chronic pancreatitis: Secondary | ICD-10-CM

## 2009-05-19 ENCOUNTER — Encounter: Payer: Self-pay | Admitting: Internal Medicine

## 2009-05-19 LAB — CONVERTED CEMR LAB
BUN: 7 mg/dL (ref 6–23)
CO2: 13 meq/L — ABNORMAL LOW (ref 19–32)
Chloride: 106 meq/L (ref 96–112)
Potassium: 4.2 meq/L (ref 3.5–5.3)

## 2009-05-26 ENCOUNTER — Ambulatory Visit: Payer: Self-pay | Admitting: Internal Medicine

## 2009-05-26 ENCOUNTER — Encounter: Payer: Self-pay | Admitting: Internal Medicine

## 2009-05-26 LAB — CONVERTED CEMR LAB
CO2: 21 meq/L (ref 19–32)
Glucose, Bld: 120 mg/dL — ABNORMAL HIGH (ref 70–99)
Potassium: 4.2 meq/L (ref 3.5–5.3)
Sodium: 139 meq/L (ref 135–145)

## 2009-06-11 ENCOUNTER — Telehealth (INDEPENDENT_AMBULATORY_CARE_PROVIDER_SITE_OTHER): Payer: Self-pay | Admitting: *Deleted

## 2009-06-28 ENCOUNTER — Ambulatory Visit: Payer: Self-pay | Admitting: Internal Medicine

## 2009-08-04 ENCOUNTER — Ambulatory Visit: Payer: Self-pay | Admitting: Infectious Diseases

## 2009-09-29 ENCOUNTER — Ambulatory Visit: Payer: Self-pay | Admitting: Internal Medicine

## 2009-10-23 ENCOUNTER — Emergency Department (HOSPITAL_COMMUNITY)
Admission: EM | Admit: 2009-10-23 | Discharge: 2009-10-24 | Payer: Self-pay | Source: Home / Self Care | Admitting: Emergency Medicine

## 2009-11-16 ENCOUNTER — Ambulatory Visit: Payer: Self-pay | Admitting: Internal Medicine

## 2009-11-16 DIAGNOSIS — D518 Other vitamin B12 deficiency anemias: Secondary | ICD-10-CM

## 2009-12-23 ENCOUNTER — Ambulatory Visit: Payer: Self-pay | Admitting: Internal Medicine

## 2010-02-10 ENCOUNTER — Ambulatory Visit: Payer: Self-pay | Admitting: Internal Medicine

## 2010-03-24 ENCOUNTER — Encounter: Admission: RE | Admit: 2010-03-24 | Discharge: 2010-03-24 | Payer: Self-pay | Admitting: Internal Medicine

## 2010-03-24 LAB — HM MAMMOGRAPHY

## 2010-04-06 ENCOUNTER — Ambulatory Visit: Payer: Self-pay | Admitting: Internal Medicine

## 2010-05-04 ENCOUNTER — Ambulatory Visit: Payer: Self-pay | Admitting: Internal Medicine

## 2010-06-22 ENCOUNTER — Ambulatory Visit
Admission: RE | Admit: 2010-06-22 | Discharge: 2010-06-22 | Payer: Self-pay | Source: Home / Self Care | Attending: Internal Medicine | Admitting: Internal Medicine

## 2010-06-26 ENCOUNTER — Encounter: Payer: Self-pay | Admitting: Neurosurgery

## 2010-07-05 NOTE — Assessment & Plan Note (Signed)
Summary: B-12/SB.  Nurse Visit   Allergies: 1)  ! Amitriptyline Hcl (Amitriptyline Hcl)  Medication Administration  Injection # 1:    Medication: Vit B12 1000 mcg    Diagnosis: ANEMIA, B12 DEFICIENCY (ICD-281.1)    Route: IM    Site: R deltoid    Exp Date: 01/2012    Lot #: [id number]    Mfr: [employer]    Patient tolerated injection without complications    Given by: Chinita Pester RN (April 06, 2010 9:12 AM)  Orders Added: 1)  Admin of Therapeutic Inj  intramuscular or subcutaneous [96372] 2)  Vit B12 1000 mcg [J3420]

## 2010-07-05 NOTE — Assessment & Plan Note (Signed)
Summary: b-12 inj/cfb  Nurse Visit   Allergies: 1)  ! Amitriptyline Hcl (Amitriptyline Hcl)  Medication Administration  Injection # 1:    Medication: Vit B12 1000 mcg    Diagnosis: ANEMIA, B12 DEFICIENCY (ICD-281.1)    Route: IM    Site: R deltoid    Exp Date: 09/2011    Lot #: [id number]    Mfr: [employer]    Patient tolerated injection without complications    Given by: Chinita Pester RN (December 23, 2009 10:29 AM)  Orders Added: 1)  Admin of Therapeutic Inj  intramuscular or subcutaneous [96372] 2)  Vit B12 1000 mcg [J3420]

## 2010-07-05 NOTE — Assessment & Plan Note (Signed)
Summary: B-12/CFB  Nurse Visit   Allergies: 1)  ! Amitriptyline Hcl (Amitriptyline Hcl)  Medication Administration  Injection # 1:    Medication: Vit B12 1000 mcg    Diagnosis: ANEMIA, MACROCYTIC (ICD-281.9)    Route: IM    Site: R deltoid    Exp Date: 05/2011    Lot #: [id number]    Mfr: [employer]    Patient tolerated injection without complications    Given by: Angelina Ok RN (September 29, 2009 11:47 AM)  Orders Added: 1)  Admin of Therapeutic Inj  intramuscular or subcutaneous [96372] 2)  Vit B12 1000 mcg [J3420]    Medication Administration  Injection # 1:    Medication: Vit B12 1000 mcg    Diagnosis: ANEMIA, MACROCYTIC (ICD-281.9)    Route: IM    Site: R deltoid    Exp Date: 05/2011    Lot #: [id number]    Mfr: [employer]    Patient tolerated injection without complications    Given by: Angelina Ok RN (September 29, 2009 11:47 AM)  Orders Added: 1)  Admin of Therapeutic Inj  intramuscular or subcutaneous [96372] 2)  Vit B12 1000 mcg [J3420]

## 2010-07-05 NOTE — Assessment & Plan Note (Signed)
Summary: vit b-12 injection//kg   Vital Signs:  Patient profile:   55 year old female Height:      61 inches (154.94 cm) Weight:      103.1 pounds (46.86 kg) BMI:     19.55 Temp:     99.1 degrees F (37.28 degrees C) oral Pulse rate:   93 / minute BP sitting:   172 / 105  (right arm) Cuff size:   regular  Vitals Entered By: Krystal Eaton Duncan Dull) (June 28, 2009 3:17 PM) CC: PT WAS HAVING LEFT SIDE BREAST PAIN AT THE TIME APPT WAS MADE, WHICH IS NOW RESOLVED, NEEDS B-12 INJECTION Is Patient Diabetic? No Pain Assessment Patient in pain? no      Nutritional Status BMI of 19 -24 = normal  Have you ever been in a relationship where you felt threatened, hurt or afraid?No   Does patient need assistance? Functional Status Self care Ambulation Normal Pt wishes to reschedule her appt and just receive her b-12 injection.Krystal Eaton (AAMA)  June 28, 2009 4:13 PM   CC:  PT WAS HAVING LEFT SIDE BREAST PAIN AT THE TIME APPT WAS MADE, WHICH IS NOW RESOLVED, and NEEDS B-12 INJECTION.  Depression History:      The patient denies a depressed mood most of the day and a diminished interest in her usual daily activities.        Preventive Screening-Counseling & Management  Alcohol-Tobacco     Alcohol drinks/day: 5     Alcohol type: BEER /wine daily     Smoking Status: current     Smoking Cessation Counseling: yes     Packs/Day: 1-2cigs/day     Year Started: AT THE AGE OF 17  Allergies: 1)  ! Amitriptyline Hcl (Amitriptyline Hcl)  Social History: Packs/Day:  1-2cigs/day   Complete Medication List: 1)  Hydrochlorothiazide 25 Mg Tabs (Hydrochlorothiazide) .... Take 1 tablet by mouth once a day 2)  Prilosec 40 Mg Cpdr (Omeprazole) .... Take 1 tablet by mouth once a day 3)  Cyanocobalamin 1000 Mcg/ml Soln (Cyanocobalamin) .... Im monthly 4)  Megace Es 625 Mg/20ml Susp (Megestrol acetate) .... Take 5 ml by mouth once daily to help boost your appetite.  use every other month as  needed. 5)  Tums 500 Mg Chew (Calcium carbonate antacid) .... Take 1 tablet by mouth three times a day 6)  Vitamin D 16109 Unit Caps (Ergocalciferol) .... Take one tablet once a week 7)  Mag-ox 400 400 Mg Tabs (Magnesium oxide) .... Take 1 tablet by mouth three times a day 8)  Vitamin B-1 100 Mg Tabs (Thiamine hcl) .... Take 1 tablet by mouth once a day 9)  Folic Acid 1 Mg Tabs (Folic acid) .... Take 1 tablet by mouth once a day 10)  Tylenol 8 Hour 650 Mg Cr-tabs (Acetaminophen) .... Take 1 tablet by mouth three times a day as needed for pain  Other Orders: Admin of Therapeutic Inj  intramuscular or subcutaneous (60454) Vit B12 1000 mcg (U9811)    Prevention & Chronic Care Immunizations   Influenza vaccine: Fluvax MCR  (03/01/2009)    Tetanus booster: Not documented    Pneumococcal vaccine: Not documented  Colorectal Screening   Hemoccult: Not documented    Colonoscopy: Not documented   Colonoscopy action/deferral: Refused  (05/18/2009)  Other Screening   Pap smear: NEGATIVE FOR INTRAEPITHELIAL LESIONS OR MALIGNANCY. BENIGN REACTIVE/REPARATIVE CHANGES.  (04/22/2009)    Mammogram: ASSESSMENT: Negative - BI-RADS 1^MM DIGITAL SCREENING  (03/22/2009)   Smoking status:  current  (06/28/2009)   Smoking cessation counseling: yes  (06/28/2009)  Lipids   Total Cholesterol: 159  (11/03/2008)   LDL: 59  (11/03/2008)   LDL Direct: Not documented   HDL: 34  (11/03/2008)   Triglycerides: 329  (11/03/2008)    SGOT (AST): 81  (04/01/2009)   SGPT (ALT): 26  (04/01/2009)   Alkaline phosphatase: 197  (04/01/2009)   Total bilirubin: 0.7  (04/01/2009)  Hypertension   Last Blood Pressure: 172 / 105  (06/28/2009)   Serum creatinine: 0.93  (05/26/2009)   Serum potassium 4.2  (05/26/2009)  Self-Management Support :   Personal Goals (by the next clinic visit) :      Personal blood pressure goal: 140/90  (06/28/2009)     Personal LDL goal: 100  (06/28/2009)    Patient will work on the  following items until the next clinic visit to reach self-care goals:     Medications and monitoring: take my medicines every day  (06/28/2009)     Eating: eat foods that are low in salt, eat baked foods instead of fried foods  (06/28/2009)     Activity: take a 30 minute walk every day  (05/18/2009)    Hypertension self-management support: Written self-care plan  (05/18/2009)    Lipid self-management support: Written self-care plan  (05/18/2009)     Medication Administration  Injection # 1:    Medication: Vit B12 1000 mcg    Diagnosis: ANEMIA, MACROCYTIC (ICD-281.9)    Route: IM    Site: R deltoid    Exp Date: 03/2011    Lot #: [id number]    Mfr: [employer]    Patient tolerated injection without complications    Given by: Krystal Eaton Duncan Dull) (June 28, 2009 4:14 PM)  Orders Added: 1)  Admin of Therapeutic Inj  intramuscular or subcutaneous [96372] 2)  Vit B12 1000 mcg [J3420]

## 2010-07-05 NOTE — Assessment & Plan Note (Signed)
Summary: B-12/SB.  Nurse Visit   Allergies: 1)  ! Amitriptyline Hcl (Amitriptyline Hcl)  Medication Administration  Injection # 1:    Medication: Vit B12 1000 mcg    Diagnosis: ANEMIA, B12 DEFICIENCY (ICD-281.1)    Route: IM    Site: R deltoid    Exp Date: 07/2011    Lot #: [id number]    Mfr: [employer]    Patient tolerated injection without complications    Given by: Marin Roberts RN (November 16, 2009 10:53 AM)  Orders Added: 1)  Vit B12 1000 mcg [J3420] 2)  Admin of Therapeutic Inj  intramuscular or subcutaneous [44034]

## 2010-07-05 NOTE — Progress Notes (Signed)
Summary: refill/gg  Phone Note Refill Request  on June 12, 2007 4:07 PM  Refills Requested: Medication #1:  FOSAMAX 70 MG TABS Take 1 tablet every week  Method Requested: [employer] Initial call taken by: Merrie Roof RN,  June 12, 2007 4:07 PM  Follow-up for Phone Call        RX sent electronically. Follow-up by: Chauncey Reading DO,  June 14, 2007 8:39 AM      Prescriptions: FOSAMAX 70 MG TABS (ALENDRONATE SODIUM) Take 1 tablet every week  #4 x 11   Entered and Authorized by:   Chauncey Reading DO   Signed by:   Chauncey Reading DO on 06/14/2007   Method used:   Electronically sent to ...       [facility] [phone]*       [address]       [place], [place]  [address]       Ph: [phone] or [phone]       Fax: [phone]   RxID:   [id number]

## 2010-07-05 NOTE — Assessment & Plan Note (Signed)
Summary: b-12inj/cfb  Nurse Visit   Allergies: 1)  ! Amitriptyline Hcl (Amitriptyline Hcl)  Medication Administration  Injection # 1:    Medication: Vit B12 1000 mcg    Diagnosis: ANEMIA, MACROCYTIC (ICD-281.9)    Route: IM    Site: R deltoid    Exp Date: 04/2011    Lot #: [id number]    Mfr: [employer]    Patient tolerated injection without complications    Given by: Marin Roberts RN (August 04, 2009 1:33 PM)  Orders Added: 1)  Admin of Therapeutic Inj  intramuscular or subcutaneous [96372] 2)  Vit B12 1000 mcg [J3420]

## 2010-07-05 NOTE — Assessment & Plan Note (Signed)
Summary: B-12/SB.  Nurse Visit   Allergies: 1)  ! Amitriptyline Hcl (Amitriptyline Hcl)  Medication Administration  Injection # 1:    Medication: Vit B12 1000 mcg    Diagnosis: ANEMIA, B12 DEFICIENCY (ICD-281.1)    Route: IM    Site: R deltoid    Exp Date: 01/2012    Lot #: [id number]    Mfr: [employer]    Patient tolerated injection without complications    Given by: Chinita Pester RN (May 04, 2010 9:50 AM)  Orders Added: 1)  Admin of Therapeutic Inj  intramuscular or subcutaneous [96372] 2)  Vit B12 1000 mcg [J3420]

## 2010-07-05 NOTE — Assessment & Plan Note (Signed)
Summary: B-12/SB.  Nurse Visit   Allergies: 1)  ! Amitriptyline Hcl (Amitriptyline Hcl)  Immunizations Administered:  Influenza Vaccine # 1:    Vaccine Type: Fluvax MCR    Site: left deltoid    Mfr: [employer]    Dose: 0.5 ml    Route: IM    Given by: Angelina Ok RN    Exp. Date: 12/03/2010    Lot #: [id number]    VIS given: 12/28/09 version given February 10, 2010.  Flu Vaccine Consent Questions:    Do you have a history of severe allergic reactions to this vaccine? no    Any prior history of allergic reactions to egg and/or gelatin? no    Do you have a sensitivity to the preservative Thimersol? no    Do you have a past history of Guillan-Barre Syndrome? no    Do you currently have an acute febrile illness? no    Have you ever had a severe reaction to latex? no    Vaccine information given and explained to patient? yes    Are you currently pregnant? no  Medication Administration  Injection # 1:    Medication: Vit B12 1000 mcg    Diagnosis: ANEMIA, B12 DEFICIENCY (ICD-281.1)    Route: IM    Site: R deltoid    Exp Date: 10/2011    Lot #: [id number]    Mfr: [employer]    Patient tolerated injection without complications    Given by: Angelina Ok RN (February 10, 2010 9:40 AM)  Orders Added: 1)  Admin of Therapeutic Inj  intramuscular or subcutaneous [96372] 2)  Vit B12 1000 mcg [J3420] 3)  Influenza Vaccine MCR [00025]   Medication Administration  Injection # 1:    Medication: Vit B12 1000 mcg    Diagnosis: ANEMIA, B12 DEFICIENCY (ICD-281.1)    Route: IM    Site: R deltoid    Exp Date: 10/2011    Lot #: [id number]    Mfr: [employer]    Patient tolerated injection without complications    Given by: Angelina Ok RN (February 10, 2010 9:40 AM)  Orders Added: 1)  Admin of Therapeutic Inj  intramuscular or subcutaneous [96372] 2)  Vit B12 1000 mcg [J3420] 3)  Influenza Vaccine MCR [00025]

## 2010-07-05 NOTE — Progress Notes (Signed)
Summary: RTC  Phone Note Call from Patient   Caller: Patient Call For: [name] Robinson MD Summary of Call: Message  from pt said that she has pain under her right breast and wants an appointment to be seen.  RTC to pt message left for pt to call the Clinics with a female who answered the phone. Initial call taken by: Angelina Ok RN,  June 11, 2009 2:48 PM  Follow-up for Phone Call        please schedule her in clinic next available appointment with any provider Follow-up by: Julaine Fusi  DO,  June 15, 2009 2:51 PM  Additional Follow-up for Phone Call Additional follow up Details #1::        Phone Call Completed, Appt Scheduled Called and spoke with patient.  She stated that she wanted to wait 2 weeks to be seen because she has another appointment around that time.  She has been sch for 06/28/2008 with the [facility] Dr. Gwenlyn Perking at 2:50pm to be seen. Additional Follow-up by: Shon Hough,  June 16, 2009 4:38 PM

## 2010-07-07 NOTE — Assessment & Plan Note (Signed)
Summary: b-12/cfb  Nurse Visit   Allergies: 1)  ! Amitriptyline Hcl (Amitriptyline Hcl)  Medication Administration  Injection # 1:    Medication: Vit B12 1000 mcg    Diagnosis: ANEMIA, B12 DEFICIENCY (ICD-281.1)    Route: IM    Site: R deltoid    Exp Date: 11/2011    Lot #: [id number]    Mfr: [employer]    Patient tolerated injection without complications    Given by: Marin Roberts RN (June 22, 2010 10:35 AM)  Orders Added: 1)  Admin of Therapeutic Inj  intramuscular or subcutaneous [96372] 2)  Vit B12 1000 mcg [J3420]

## 2010-08-03 ENCOUNTER — Ambulatory Visit (INDEPENDENT_AMBULATORY_CARE_PROVIDER_SITE_OTHER): Payer: PRIVATE HEALTH INSURANCE | Admitting: *Deleted

## 2010-08-03 DIAGNOSIS — D518 Other vitamin B12 deficiency anemias: Secondary | ICD-10-CM

## 2010-08-03 MED ORDER — CYANOCOBALAMIN 1000 MCG/ML IJ SOLN
1000.0000 ug | INTRAMUSCULAR | Status: DC
  Administered 2010-08-03 – 2011-06-28 (×5): 1000 ug via INTRAMUSCULAR

## 2010-08-22 LAB — URINE MICROSCOPIC-ADD ON

## 2010-08-22 LAB — URINALYSIS, ROUTINE W REFLEX MICROSCOPIC
Glucose, UA: NEGATIVE mg/dL
Protein, ur: NEGATIVE mg/dL

## 2010-08-22 LAB — DIFFERENTIAL
Eosinophils Absolute: 0.2 10*3/uL (ref 0.0–0.7)
Lymphocytes Relative: 31 % (ref 12–46)
Lymphs Abs: 2.7 10*3/uL (ref 0.7–4.0)
Neutro Abs: 4.7 10*3/uL (ref 1.7–7.7)
Neutrophils Relative %: 53 % (ref 43–77)

## 2010-08-22 LAB — CBC
HCT: 32.2 % — ABNORMAL LOW (ref 36.0–46.0)
Hemoglobin: 11.3 g/dL — ABNORMAL LOW (ref 12.0–15.0)
MCHC: 35.2 g/dL (ref 30.0–36.0)
RDW: 12.9 % (ref 11.5–15.5)

## 2010-08-22 LAB — COMPREHENSIVE METABOLIC PANEL
ALT: 14 U/L (ref 0–35)
BUN: 11 mg/dL (ref 6–23)
CO2: 17 mEq/L — ABNORMAL LOW (ref 19–32)
Calcium: 8 mg/dL — ABNORMAL LOW (ref 8.4–10.5)
Creatinine, Ser: 1 mg/dL (ref 0.4–1.2)
GFR calc non Af Amer: 58 mL/min — ABNORMAL LOW (ref >60)
Glucose, Bld: 108 mg/dL — ABNORMAL HIGH (ref 70–99)

## 2010-09-05 ENCOUNTER — Emergency Department (HOSPITAL_COMMUNITY): Payer: PRIVATE HEALTH INSURANCE

## 2010-09-05 ENCOUNTER — Emergency Department (HOSPITAL_COMMUNITY)
Admission: EM | Admit: 2010-09-05 | Discharge: 2010-09-05 | Disposition: A | Discharge status: Home / Self Care | Payer: PRIVATE HEALTH INSURANCE | Attending: Emergency Medicine | Admitting: Emergency Medicine

## 2010-09-05 DIAGNOSIS — M25559 Pain in unspecified hip: Secondary | ICD-10-CM | POA: Insufficient documentation

## 2010-09-05 DIAGNOSIS — M545 Low back pain, unspecified: Secondary | ICD-10-CM | POA: Insufficient documentation

## 2010-09-05 DIAGNOSIS — M79609 Pain in unspecified limb: Secondary | ICD-10-CM | POA: Insufficient documentation

## 2010-09-05 DIAGNOSIS — G40909 Epilepsy, unspecified, not intractable, without status epilepticus: Secondary | ICD-10-CM | POA: Insufficient documentation

## 2010-09-05 DIAGNOSIS — I10 Essential (primary) hypertension: Secondary | ICD-10-CM | POA: Insufficient documentation

## 2010-09-05 DIAGNOSIS — R109 Unspecified abdominal pain: Secondary | ICD-10-CM | POA: Insufficient documentation

## 2010-09-05 DIAGNOSIS — Z79899 Other long term (current) drug therapy: Secondary | ICD-10-CM | POA: Insufficient documentation

## 2010-09-06 LAB — CBC
HCT: 29.7 % — ABNORMAL LOW (ref 36.0–46.0)
HCT: 32 % — ABNORMAL LOW (ref 36.0–46.0)
Hemoglobin: 10.5 g/dL — ABNORMAL LOW (ref 12.0–15.0)
Hemoglobin: 11.2 g/dL — ABNORMAL LOW (ref 12.0–15.0)
MCHC: 35.2 g/dL (ref 30.0–36.0)
MCHC: 35.4 g/dL (ref 30.0–36.0)
MCV: 104.2 fL — ABNORMAL HIGH (ref 78.0–100.0)
MCV: 104.4 fL — ABNORMAL HIGH (ref 78.0–100.0)
MCV: 105.6 fL — ABNORMAL HIGH (ref 78.0–100.0)
Platelets: 369 10*3/uL (ref 150–400)
Platelets: 370 10*3/uL (ref 150–400)
RBC: 2.85 MIL/uL — ABNORMAL LOW (ref 3.87–5.11)
RBC: 2.97 MIL/uL — ABNORMAL LOW (ref 3.87–5.11)
RDW: 12.2 % (ref 11.5–15.5)
WBC: 9.8 10*3/uL (ref 4.0–10.5)

## 2010-09-06 LAB — BASIC METABOLIC PANEL
BUN: 2 mg/dL — ABNORMAL LOW (ref 6–23)
BUN: 3 mg/dL — ABNORMAL LOW (ref 6–23)
BUN: 4 mg/dL — ABNORMAL LOW (ref 6–23)
BUN: 8 mg/dL (ref 6–23)
CO2: 17 mEq/L — ABNORMAL LOW (ref 19–32)
CO2: 18 mEq/L — ABNORMAL LOW (ref 19–32)
CO2: 19 mEq/L (ref 19–32)
CO2: 20 mEq/L (ref 19–32)
CO2: 23 mEq/L (ref 19–32)
Calcium: 9 mg/dL (ref 8.4–10.5)
Chloride: 108 mEq/L (ref 96–112)
Chloride: 109 mEq/L (ref 96–112)
Chloride: 112 mEq/L (ref 96–112)
Chloride: 112 mEq/L (ref 96–112)
Creatinine, Ser: 0.68 mg/dL (ref 0.4–1.2)
Creatinine, Ser: 0.73 mg/dL (ref 0.4–1.2)
Creatinine, Ser: 0.85 mg/dL (ref 0.4–1.2)
Creatinine, Ser: 0.85 mg/dL (ref 0.4–1.2)
GFR calc Af Amer: >60 mL/min (ref >60)
GFR calc Af Amer: >60 mL/min (ref >60)
GFR calc Af Amer: >60 mL/min (ref >60)
Glucose, Bld: 95 mg/dL (ref 70–99)
Potassium: 4.3 mEq/L (ref 3.5–5.1)
Sodium: 135 mEq/L (ref 135–145)

## 2010-09-06 LAB — DIFFERENTIAL
Basophils Absolute: 0 10*3/uL (ref 0.0–0.1)
Basophils Relative: 0 % (ref 0–1)
Eosinophils Absolute: 0.5 10*3/uL (ref 0.0–0.7)
Eosinophils Absolute: 0.5 10*3/uL (ref 0.0–0.7)
Eosinophils Relative: 5 % (ref 0–5)
Lymphs Abs: 1.9 10*3/uL (ref 0.7–4.0)
Monocytes Absolute: 1.3 10*3/uL — ABNORMAL HIGH (ref 0.1–1.0)
Neutro Abs: 5.3 10*3/uL (ref 1.7–7.7)
Neutrophils Relative %: 61 % (ref 43–77)

## 2010-09-07 LAB — COMPREHENSIVE METABOLIC PANEL
ALT: 12 U/L (ref 0–35)
ALT: 17 U/L (ref 0–35)
AST: 31 U/L (ref 0–37)
Alkaline Phosphatase: 156 U/L — ABNORMAL HIGH (ref 39–117)
CO2: 17 mEq/L — ABNORMAL LOW (ref 19–32)
Calcium: 8.5 mg/dL (ref 8.4–10.5)
Calcium: 9.2 mg/dL (ref 8.4–10.5)
GFR calc Af Amer: >60 mL/min (ref >60)
GFR calc non Af Amer: >60 mL/min (ref >60)
Glucose, Bld: 96 mg/dL (ref 70–99)
Potassium: 4.1 mEq/L (ref 3.5–5.1)
Sodium: 130 mEq/L — ABNORMAL LOW (ref 135–145)
Sodium: 134 mEq/L — ABNORMAL LOW (ref 135–145)
Total Bilirubin: 0.5 mg/dL (ref 0.3–1.2)
Total Protein: 9.1 g/dL — ABNORMAL HIGH (ref 6.0–8.3)

## 2010-09-07 LAB — AMYLASE: Amylase: 151 U/L — ABNORMAL HIGH (ref 27–131)

## 2010-09-07 LAB — CBC
HCT: 28.6 % — ABNORMAL LOW (ref 36.0–46.0)
Hemoglobin: 10.2 g/dL — ABNORMAL LOW (ref 12.0–15.0)
Hemoglobin: 10.7 g/dL — ABNORMAL LOW (ref 12.0–15.0)
Hemoglobin: 10.8 g/dL — ABNORMAL LOW (ref 12.0–15.0)
Hemoglobin: 13.5 g/dL (ref 12.0–15.0)
MCHC: 34.8 g/dL (ref 30.0–36.0)
MCHC: 35.3 g/dL (ref 30.0–36.0)
MCV: 104.6 fL — ABNORMAL HIGH (ref 78.0–100.0)
Platelets: 259 10*3/uL (ref 150–400)
Platelets: 261 10*3/uL (ref 150–400)
RBC: 2.96 MIL/uL — ABNORMAL LOW (ref 3.87–5.11)
RBC: 3.65 MIL/uL — ABNORMAL LOW (ref 3.87–5.11)
RDW: 12.2 % (ref 11.5–15.5)
RDW: 12.3 % (ref 11.5–15.5)
WBC: 7.2 10*3/uL (ref 4.0–10.5)

## 2010-09-07 LAB — BASIC METABOLIC PANEL
BUN: 9 mg/dL (ref 6–23)
Calcium: 8.9 mg/dL (ref 8.4–10.5)
Chloride: 107 mEq/L (ref 96–112)
Creatinine, Ser: 0.76 mg/dL (ref 0.4–1.2)
GFR calc Af Amer: >60 mL/min (ref >60)
GFR calc non Af Amer: >60 mL/min (ref >60)
GFR calc non Af Amer: >60 mL/min (ref >60)
Glucose, Bld: 68 mg/dL — ABNORMAL LOW (ref 70–99)
Potassium: 3.6 mEq/L (ref 3.5–5.1)
Sodium: 133 mEq/L — ABNORMAL LOW (ref 135–145)
Sodium: 134 mEq/L — ABNORMAL LOW (ref 135–145)
Sodium: 135 mEq/L (ref 135–145)

## 2010-09-07 LAB — DIFFERENTIAL
Basophils Absolute: 0 10*3/uL (ref 0.0–0.1)
Basophils Relative: 0 % (ref 0–1)
Eosinophils Absolute: 0.1 10*3/uL (ref 0.0–0.7)
Eosinophils Relative: 1 % (ref 0–5)
Lymphocytes Relative: 17 % (ref 12–46)
Lymphs Abs: 1.6 10*3/uL (ref 0.7–4.0)
Monocytes Absolute: 0.5 10*3/uL (ref 0.1–1.0)
Monocytes Relative: 5 % (ref 3–12)
Neutro Abs: 6.4 10*3/uL (ref 1.7–7.7)

## 2010-09-07 LAB — RENAL FUNCTION PANEL
CO2: 17 mEq/L — ABNORMAL LOW (ref 19–32)
Chloride: 96 mEq/L (ref 96–112)
Glucose, Bld: 84 mg/dL (ref 70–99)
Phosphorus: 3.4 mg/dL (ref 2.3–4.6)
Potassium: 4.6 mEq/L (ref 3.5–5.1)
Sodium: 123 mEq/L — ABNORMAL LOW (ref 135–145)

## 2010-09-07 LAB — TSH: TSH: 0.593 u[IU]/mL (ref 0.350–4.500)

## 2010-09-07 LAB — PROTIME-INR
INR: 1.15 (ref 0.00–1.49)
Prothrombin Time: 14.6 seconds (ref 11.6–15.2)

## 2010-09-07 LAB — URINALYSIS, ROUTINE W REFLEX MICROSCOPIC
Ketones, ur: NEGATIVE mg/dL
Protein, ur: 100 mg/dL — AB
Urobilinogen, UA: 1 mg/dL (ref 0.0–1.0)

## 2010-09-07 LAB — APTT: aPTT: 33 seconds (ref 24–37)

## 2010-09-07 LAB — URINE MICROSCOPIC-ADD ON

## 2010-09-07 LAB — MAGNESIUM: Magnesium: 2.2 mg/dL (ref 1.5–2.5)

## 2010-09-08 ENCOUNTER — Ambulatory Visit (INDEPENDENT_AMBULATORY_CARE_PROVIDER_SITE_OTHER): Payer: PRIVATE HEALTH INSURANCE | Admitting: *Deleted

## 2010-09-08 DIAGNOSIS — D518 Other vitamin B12 deficiency anemias: Secondary | ICD-10-CM

## 2010-09-13 ENCOUNTER — Inpatient Hospital Stay (INDEPENDENT_AMBULATORY_CARE_PROVIDER_SITE_OTHER)
Admission: RE | Admit: 2010-09-13 | Discharge: 2010-09-13 | Disposition: A | Discharge status: Home / Self Care | Payer: PRIVATE HEALTH INSURANCE | Source: Ambulatory Visit

## 2010-09-13 ENCOUNTER — Ambulatory Visit (INDEPENDENT_AMBULATORY_CARE_PROVIDER_SITE_OTHER): Payer: PRIVATE HEALTH INSURANCE

## 2010-09-13 DIAGNOSIS — IMO0002 Reserved for concepts with insufficient information to code with codable children: Secondary | ICD-10-CM

## 2010-09-15 ENCOUNTER — Encounter: Payer: Self-pay | Admitting: Internal Medicine

## 2010-09-27 ENCOUNTER — Ambulatory Visit (INDEPENDENT_AMBULATORY_CARE_PROVIDER_SITE_OTHER): Payer: PRIVATE HEALTH INSURANCE | Admitting: Ophthalmology

## 2010-09-27 ENCOUNTER — Other Ambulatory Visit: Payer: Self-pay | Admitting: *Deleted

## 2010-09-27 VITALS — BP 168/102 | HR 90 | Temp 97.6°F | Ht 62.0 in | Wt 88.1 lb

## 2010-09-27 DIAGNOSIS — D539 Nutritional anemia, unspecified: Secondary | ICD-10-CM

## 2010-09-27 DIAGNOSIS — F1010 Alcohol abuse, uncomplicated: Secondary | ICD-10-CM

## 2010-09-27 DIAGNOSIS — M76899 Other specified enthesopathies of unspecified lower limb, excluding foot: Secondary | ICD-10-CM

## 2010-09-27 DIAGNOSIS — I10 Essential (primary) hypertension: Secondary | ICD-10-CM

## 2010-09-27 DIAGNOSIS — M7060 Trochanteric bursitis, unspecified hip: Secondary | ICD-10-CM | POA: Insufficient documentation

## 2010-09-27 DIAGNOSIS — Z0000 Encounter for general adult medical examination without abnormal findings: Secondary | ICD-10-CM

## 2010-09-27 DIAGNOSIS — R8789 Other abnormal findings in specimens from female genital organs: Secondary | ICD-10-CM

## 2010-09-27 LAB — BASIC METABOLIC PANEL
BUN: 8 mg/dL (ref 6–23)
CO2: 15 mEq/L — ABNORMAL LOW (ref 19–32)
Glucose, Bld: 89 mg/dL (ref 70–99)
Potassium: 3.7 mEq/L (ref 3.5–5.3)
Sodium: 138 mEq/L (ref 135–145)

## 2010-09-27 LAB — CBC
HCT: 36.2 % (ref 36.0–46.0)
MCHC: 34 g/dL (ref 30.0–36.0)
MCV: 104 fL — ABNORMAL HIGH (ref 78.0–100.0)
Platelets: 199 10*3/uL (ref 150–400)
RDW: 13.4 % (ref 11.5–15.5)

## 2010-09-27 MED ORDER — VITAMIN B-1 100 MG PO TABS: 100.0000 mg | ORAL_TABLET | Freq: Every day | ORAL | Status: DC

## 2010-09-27 MED ORDER — IBUPROFEN 200 MG PO TABS: 200.0000 mg | ORAL_TABLET | Freq: Three times a day (TID) | ORAL | Status: AC

## 2010-09-27 MED ORDER — CYANOCOBALAMIN 1000 MCG/ML IJ SOLN
1000.0000 ug | Freq: Once | INTRAMUSCULAR | Status: AC
  Administered 2010-09-27: 1000 ug via INTRAMUSCULAR

## 2010-09-27 MED ORDER — OMEPRAZOLE 40 MG PO CPDR: 40.0000 mg | DELAYED_RELEASE_CAPSULE | Freq: Every day | ORAL | Status: DC

## 2010-09-27 MED ORDER — FOLIC ACID 1 MG PO TABS: 1.0000 mg | ORAL_TABLET | Freq: Every day | ORAL | Status: DC

## 2010-09-27 MED ORDER — HYDROCHLOROTHIAZIDE 25 MG PO TABS: 25.0000 mg | ORAL_TABLET | Freq: Every day | ORAL | Status: DC

## 2010-09-27 MED ORDER — CALCIUM CARBONATE ANTACID 500 MG PO CHEW: 1.0000 | CHEWABLE_TABLET | Freq: Three times a day (TID) | ORAL | Status: DC

## 2010-09-27 NOTE — Assessment & Plan Note (Signed)
The patient is currently receiving treatment from [facility].

## 2010-09-27 NOTE — Progress Notes (Signed)
Subjective:    [name], female    DOB: 1955-12-05, 55 y.o.   MRN: [id number]  HPI This is a 55 year old female with a history significant for alcoholism, depression, hypertension, and failure to thrive, who presents for right hip pain. The patient has not been seen in the clinic since December of 2010. The patient states that her hip pain has been ongoing for the last 2 months, and has progressively been getting worse. Patient states that the pain is worse in the morning, but stays bad throughout the day. The pain is described as sharp and radiates down to the knee and into the groin. The pain is 7/10 in intensity at its only relieved by lying down flat on her back. The patient denies any recent changes in her bowel movements, and has had no loss of control of her bladder or inability to urinate. The patient denies back pain, fevers or chills. The patient also denies any saddle anesthesia. The patient has not been on any of her medications for months, because she's not been into the clinic. The patient was evaluated for her hip pain in the ED on April 2, and hip x-ray did not demonstrate any degenerative changes. The patient was reportedly having back pain at that time, and lumbar extra demonstrated degenerative changes including retrolisthesis and disc height loss at L5-S1.  Review of Systems  Constitutional: Negative for fever and chills.  Respiratory: Negative for cough and shortness of breath.   Cardiovascular: Negative for chest pain and palpitations.  Gastrointestinal: Negative for vomiting, diarrhea and constipation.       Objective:   Physical Exam  Constitutional: She appears well-developed and well-nourished.  HENT:  Head: Normocephalic and atraumatic.  Eyes: Pupils are equal, round, and reactive to light.  Cardiovascular: Normal rate, regular rhythm and intact distal pulses.  Exam reveals no gallop and no friction rub.   No murmur heard. Pulmonary/Chest: Effort  normal and breath sounds normal. She has no wheezes. She has no rales.  Abdominal: Soft. Bowel sounds are normal. She exhibits no distension. There is no tenderness.  Musculoskeletal: Normal range of motion.       There is tenderness to palpation over the right trochanteric bursa, there is no tenderness to palpation of the lumbar spine, there is full range of motion without pain of both the lumbar spine and right hip. There is no weakness in the lower extremities, and reflexes are 1+ and symmetric throughout. Straight leg raise is negative bilaterally.  Neurological: She is alert. No cranial nerve deficit.  Skin: No rash noted.        Current Outpatient Prescriptions on File Prior to Visit  Medication Sig Dispense Refill  . acetaminophen (TYLENOL) 650 MG CR tablet Take 650 mg by mouth every 8 (eight) hours as needed.        . calcium carbonate (TUMS - DOSED IN MG ELEMENTAL CALCIUM) 500 MG chewable tablet Chew 1 tablet by mouth 3 (three) times daily.        . ergocalciferol (VITAMIN D2) 50000 UNITS capsule Take 50,000 Units by mouth once a week.        . folic acid (FOLVITE) 1 MG tablet Take 1 mg by mouth daily.        . hydrochlorothiazide 25 MG tablet Take 25 mg by mouth daily.        . magnesium oxide (MAG-OX) 400 MG tablet Take 400 mg by mouth. Three times daily.       Marland Kitchen  megestrol (MEGACE ES) 625 MG/5ML suspension Take 625 mg by mouth daily. Use every other month as needed.       Marland Kitchen omeprazole (PRILOSEC) 40 MG capsule Take 40 mg by mouth daily.        . Thiamine HCl (VITAMIN B-1) 100 MG tablet Take 100 mg by mouth daily.         Current Facility-Administered Medications on File Prior to Visit  Medication Dose Route Frequency Provider Last Rate Last Dose  . cyanocobalamin ((VITAMIN B-12)) injection 1,000 mcg  1,000 mcg Intramuscular Q30 days Nodira Karimova   1,000 mcg at 09/08/10 1104   Past Medical History  Diagnosis Date  . Anemia, B12 deficiency   . History of acute pancreatitis     . Right knee pain     No recent imaging on chart  . 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 Dr. Su Hilt, [facility]  . Hypertriglyceridemia   . GERD (gastroesophageal reflux disease)   . Vitamin D deficiency   . Subdural hematoma 02/2008    Likely 2/2 trauma from seizure from EtOH withdrawal, chronic in nature, sees Dr. Robyne Askew. Most recent CT head 10/2009 showing stable but persistent hematoma without mass effect.  . History of seizure disorder     Likely 2/2 alcohol abuse  . Hypocalcemia   . Hypomagnesemia   . Failure to thrive     Unclear etiology  . HTN (hypertension)   . Thrombocytopenia   . Anemia, macrocytic   . Hepatomegaly     On exam  . Alcohol abuse      Assessment & Plan:

## 2010-09-27 NOTE — Assessment & Plan Note (Addendum)
Will recheck magnesium level today. Will initiate magnesium treatment if continues to be low.  Mag was 0.9 today, will restart magnesium repletion, and follow up at her next visit. I contacted the patients home and left a message for her to contact the clinic.

## 2010-09-27 NOTE — Assessment & Plan Note (Signed)
The patient is interested in pursuing colonoscopy, I will refer her to GI today.

## 2010-09-27 NOTE — Assessment & Plan Note (Signed)
Patient has been taking vitamin B12 injections. I will check a CBC today to determine whether or not she continues to be anemic.

## 2010-09-27 NOTE — Patient Instructions (Addendum)
Please return to the clinic in one month for a repeat blood pressure check. Call sooner if you have problems.

## 2010-09-27 NOTE — Progress Notes (Signed)
Addended by: Angelina Ok on: 09/27/2010 10:40 AM   Modules accepted: Orders

## 2010-09-27 NOTE — Assessment & Plan Note (Signed)
The patient states that she's been off all of her medications for months. Blood pressure is elevated today. I'll restart the patient's current home regimen, with a plan to followup on her blood pressure with repeat BMET in one month. We'll check a BMET today.

## 2010-09-27 NOTE — Assessment & Plan Note (Addendum)
The patient has been receiving annual Pap smear the last of which was negative. The patient typically gets her Pap smear performed in November.

## 2010-09-27 NOTE — Assessment & Plan Note (Signed)
The patient clinical exam is most consistent with trochanteric bursitis. I will not repeat imaging of that was done previously in the ED. I am concerned about the use of either Tylenol or NSAIDs in this patient given her alcoholic history. I will however prescribe her a short course of ibuprofen over the next week to try to improve her symptoms. The patient was instructed to contact the clinic if she does not see improvement over that time period, as she may need a steroid injection.

## 2010-09-28 MED ORDER — MAGNESIUM OXIDE 400 MG PO TABS: 400.0000 mg | ORAL_TABLET | Freq: Every day | ORAL | Status: DC

## 2010-09-28 NOTE — Progress Notes (Signed)
Addended by: Sinda Du on: 09/28/2010 10:31 AM   Modules accepted: Orders

## 2010-09-29 MED ORDER — MAGNESIUM OXIDE 400 MG PO TABS: 400.0000 mg | ORAL_TABLET | Freq: Two times a day (BID) | ORAL | Status: DC

## 2010-09-29 NOTE — Telephone Encounter (Signed)
Hi, pt would like to know why these refills were not approved. i will call her back if you would like.  Thank you,h.

## 2010-09-29 NOTE — Progress Notes (Signed)
Addended by: Sinda Du on: 09/29/2010 04:09 PM   Modules accepted: Orders

## 2010-10-18 NOTE — Discharge Summary (Signed)
NAMEMarland Kitchen  [name], [name] NO.:  [id number]   MEDICAL RECORD NO.:  [id number]          PATIENT TYPE:  INP   LOCATION:  4736                         FACILITY:  [facility]   PHYSICIAN:  [name] Sell, MD DATE OF BIRTH:  1956-02-16   DATE OF ADMISSION:  11/26/2007  DATE OF DISCHARGE:  12/02/2007                               DISCHARGE SUMMARY   DISCHARGE DIAGNOSES:  1. Seizure secondary to severe hypocalcemia.  2. Nausea, vomiting, and diarrhea.  3. Severe hypocalcemia.  4. Severe hypomagnesemia.  5. Vitamin D deficiency.  6. Hypokalemia.  7. Fever/cough.  8. Alcohol abuse.  9. Hypertension.  10.Osteopenia.  11.Chronic thrombocytopenia.  12.B12 deficiency.  13.History of chronic pancreatitis.  14.History of depression.  15.History of right ankle fracture.  16.Failure to thrive.  17.History of shingles.  18.Macrocytic anemia.   DISCHARGE MEDICATIONS:  1. Tums one tab p.o. t.i.d.  2. Ergocalciferol 50,000 units p.o. once weekly.  3. Magnesium oxide 400 mg p.o. t.i.d.  4. Remeron 15 mg p.o. q.h.s.  5. Avelox 400 mg p.o. daily x3 days.  6. Tamiflu 75 mg p.o. b.i.d. for one more day.  7. Thiamine 100 mg p.o. daily.  8. Folic acid 1 mg p.o. daily.  9. Omeprazole 40 mg p.o. daily.  10.Vitamin B12 injections q. monthly.  11.Darvocet p.o. q.6 h. p.r.n.  12.HCTZ 25 mg p.o. daily.  13.Lisinopril 10 mg p.o. daily.  14.Megace 5 mL p.o. daily.   DISPOSITION/FOLLOWUP:  The patient is to follow up in the internal  medicine outpatient clinic with Dr. Maple Hudson on July 1 at 9:45 a.m.  At  that time a BMET needs to be drawn to evaluate the patient's potassium  and calcium.  A magnesium level also needs to be drawn.  The patient's  blood pressure also needs to be evaluated at that time.   PROCEDURES PERFORMED:  No procedures were performed.   CONSULTATIONS:  No consultations were obtained.   ADMISSION HISTORY AND PHYSICAL:  The patient is a 55 year old African  American  female who presented to be urgent care clinic with complaints  of severe nausea, vomiting, and diarrhea.  She has a significant alcohol  history and notes that she had stopped drinking for about a week to two  weeks prior to this visit.  Labs were checked and she was noted to have  severe hypocalcemia with a calcium level to be 4.8, and advised to go to  the emergency room.  She elected not to go to the emergency room at that  time, and had a syncopal event at home and went to the emergency room  and had repeat labs checked and was discharged home.  When she got home  she had a witnessed tonic-clonic seizure, and was brought back to the  emergency room and was admitted.  She had a hematoma on her right eye  from where she had lost consciousness and hit her head.  She was  slightly postictal in the emergency room.  She denied any fevers or  headaches, and just complained of nausea, vomiting, and diarrhea for  several days prior to  admission.   PHYSICAL EXAMINATION:  ADMISSION VITAL SIGNS:  Temperature 99, blood  pressure 132/96, pulse 105, respiratory rate 19, O2 sat 100% on room  air.  GENERAL:  She is alert, pleasant, oriented, in no acute distress.  HEENT:  Anicteric.  Pupils equally round and react to light.  Extraocular motion is intact.  Ecchymoses and hematoma were noted on the  outside of the right eye.  ENT:  Dry mouth.  RESPIRATORY:  Clear to auscultation bilaterally.  CARDIOVASCULAR:  Regular rate and rhythm.  No murmurs, rubs or gallops.  ABDOMEN:  Good bowel sounds, soft, nontender, nondistended.  EXTREMITIES:  No edema or pain.  SKIN:  No rashes noted, ecchymoses noted on the right eye.  NEUROLOGICAL:  Cranial nerves II-XII are intact.  Alert and oriented x3,  following commands.  Sensation was intact.   ADMISSION LABORATORIES:  Sodium 137, potassium 3.1, chloride 107, bicarb  19, BUN 6, creatinine 1.1, glucose 108.  Total bilirubin 1, alkaline  phosphatase 74.  AST 42,  ALT 9.  Total protein 7.  Albumin 2.6.  Calcium  4.6.  Phosphorus 2.9.  Magnesium 0.6. White blood cell count 7.2,  hemoglobin 10.3, MCV 101.2, platelet count 129.  Lactic acid 1.9.  Serum  ketones negative. Tylenol and salicylate levels negative.  Serum  osmolality 274.  HIV nonreactive.  Cardiac enzymes:  CK 286, troponin  less than 0.01, CK-MB 2.  25 hydroxy vitamin D level 5, RBC folate 382,  sed rate 110, parathyroid hormone level 214 with a total calcium 5.8 on  that sample.   DIAGNOSTIC IMAGING:  CT head without contrast showed no acute  intracranial findings and no mass lesions.   Chest x-ray two days after admission showed new left basilar opacity and  probable associated pleural effusions.   HOSPITAL COURSE:  1. Seizure.  The patient was noted to have a tonic-clonic seizure, and      a CAT scan of the head was negative.  The differential was to      include alcohol withdrawal versus her severe electrolyte      abnormalities.  On exam she was not tachycardiac and had no      evidence of DT's, and it was felt like the seizure was likely      related to her severe hypocalcemia.  The patient's calcium was      noted to be as low as 4.6 at one point.  The patient had her      electrolytes aggressively repleted, and was placed on seizure      precautions and did not have any evidence of seizure activity      during this hospitalization.  2. Nausea, vomiting, and diarrhea.  It was felt like this was likely a      viral gastroenteritis and resolved after hospitalization.  3. Severe hypocalcemia.  The patient had a parathyroid hormone level      checked.  It was appropriately high.  The patient was noted to have      severe vitamin D deficiency with a vitamin D level at 5.  Both      calcium and vitamin D supplementation were started during this      hospitalization.  The patient's home biphosphonate was stopped      since this can cause hypocalcemia.  Her calcium increased to  around      the 8 range during this hospitalization.  4. Severe hypomagnesemia.  It was felt like her severe  hypomagnesemia      may be multifactorial related to her alcoholism and possibly      malabsorptive issues along with decreased p.o. intake given her      history of severe nausea, vomiting, and diarrhea.  Her magnesium      was aggressively repleted, and the patient received over 10 g of IV      magnesium during this hospitalization and her magnesium over the      last two days prior to discharge remained in the 1.6-1.7 range.      This will need to be followed up in the outpatient setting.  5. Vitamin D deficiency.  The patient was noted to have a severe      vitamin D deficiency with a level of 25 hydroxy vitamin D to be at      5.  She was started on ergocalciferol, and this will need to be      followed up in the outpatient setting.  6. Hypokalemia.  The patient had a potassium noted prior to admission      at urgent care to be 2.7.  Given her history of severe nausea,      vomiting, and diarrhea it was felt like this could explain her      severe hypokalemia, and this was repleted intravenously.  7. Fever and cough.  Two days after admission the patient noted a      cough, and had a fever up to 101.6.  A chest x-ray was done, and      showed possible left lower lobe opacity and left lower lobe pleural      effusion.  It was felt like the patient was at high risk for      aspiration pneumonia given her history of seizure, and she was      started on Avelox.  Given her symptoms started only two days after      admission she also could have been exposed to the flu, and she was      started on Tamiflu.  Her symptoms also could be related to an      aspiration pneumonitis, but she was started on antimicrobial      therapy just in case and her symptoms resolved prior to discharge.  8. Alcohol abuse.  The patient was continued on thiamine, folic acid,      and provided counseling  on cessation.  9. Hypertension.  The patient's blood pressure medications were held      initially during this hospitalization.  Will be restarted in the      outpatient setting.  10.Chronic thrombocytopenia.  The patient's platelets remained stable      around the 120 range just consistent with previous values.  11.Macrocytic anemia.  Note that the patient's hemoglobin dropped by      several grams during this hospitalization, and it was felt like      that was partially dilutional.  The patient did not have any      evidence of bleeding, and anemia panel was checked and her ferritin      was elevated at 613.  Her vitamin B12 was elevated and her folate      was normal at 8.8.  Her iron, TIBC, and percent saturation was low,      and it was felt like there was a component of anemia of chronic      disease with this.  She will continue on her B12 supplementation  in      the outpatient setting.   DISCHARGE VITAL SIGNS:  Temperature 98.5, blood pressure 142/91, heart  rate 103, respiratory rate 16, O2 sat 97% on room air.   DISCHARGE LABORATORIES:  Phosphorus 3.4.  Magnesium 1.6.  Sodium 141,  potassium 4, chloride 109, bicarb 22, BUN 3, creatinine 0.83, glucose  99.  Calcium 8.  White blood cell count 5.4, hemoglobin 8.4, platelet  count 274.     [name] Courts, MD  Electronically Signed      [name] Sell, MD  Electronically Signed   VW/MEDQ  D:  12/03/2007  T:  12/03/2007  Job:  [id number]

## 2010-10-18 NOTE — Discharge Summary (Signed)
[name], [name] [name]             ACCOUNT NO.:  [id number]   MEDICAL RECORD NO.:  [id number]          PATIENT TYPE:  INP   LOCATION:  4706                         FACILITY:  [facility]   PHYSICIAN:  Madaline Guthrie, M.D.    DATE OF BIRTH:  February 23, 1956   DATE OF ADMISSION:  12/06/2007  DATE OF DISCHARGE:  12/07/2007                               DISCHARGE SUMMARY   DISCHARGE DIAGNOSES:  1. Altered mental status secondary to cocaine use.  2. Elevated blood pressure secondary to cocaine use.  3. Leukocytosis secondary to cocaine use.  4. History of alcohol abuse.  5. History of alcoholic hepatitis in 2003.  6. History of seizure secondary to severe hypocalcemia in June 2009.  7. Severe hypocalcemia and hypomagnesemia, resolved.   DISCHARGE MEDICATIONS:  1. Tums 1 tablet p.o. t.i.d.  2. Vitamin D 50,000 units p.o. once a week.  3. Magnesium oxide 400 mg p.o. t.i.d.  4. HCTZ 25 mg p.o. daily.  5. Omeprazole 40 mg p.o. daily.  6. Lisinopril 10 mg p.o. daily.  7. Thiamine 100 mg p.o. daily.  8. Darvocet 100/500 one tablet p.o. q.6 h. p.r.n. for pain.  9. Vitamin B12 injection q. month.   DISPOSITION AND FOLLOWUP:  [name] [name] was discharged in hemodynamically  stable condition after having returned to baseline mental status.  She  will follow up in the outpatient clinic.  However, this appointment has  not been set up yet since she was discharged on the weekend.  The clinic  will contact her to set up appointment.   PROCEDURES:  1. A chest x-ray on December 06, 2007, showed no active disease.  2. A head CT without contrast medium on December 06, 2007, showed mild      increase in size of small right frontoparietal subdural hygroma but      no evidence of significant mass effect or midline shift.   CONSULTATIONS:  None.   BRIEF ADMISSION HISTORY:  [name] [name] is a 55 year old woman who was  recently discharged from the hospital on December 02, 2007, for seizure in  the setting of severe  hypocalcemia.  She presented to the emergency  department with altered mental status.  She was witnessed by her family  screaming out of her bedroom window around 3 a.m. on the morning of  admission.  The patient was convinced that there was someone trying to  break in.  Police was called and they searched the area and determined  that it was clear.  The patient was then found on the floor and began  screaming stating that someone was out to get her.  Per her daughter,  she had never displayed such behavior in the past.  Since her hospital  discharge, the daughter had been in her mother's company on a daily  basis except on the day prior to admission.  As far as daughter was  concerned, she had not had any alcohol or used any drugs.   ALLERGIES:  AMITRIPTYLINE causes facial swelling.   ADMISSION PHYSICAL EXAMINATION:  Temperature 98.6, blood pressure  198/113, heart rate 130, respiratory rate 20, and  O2 sat 100% on room  air.  The patient was found in bed trying to cover herself with a sheet  to sleep.  She had incoherent speech and would not cooperate with  physical exam.  However, her neurological exam was grossly nonfocal.   LABORATORY DATA:  Sodium 139, potassium 2.8, chloride 108, bicarb 20,  BUN 16, creatinine 0.59, glucose 225.  White blood count 10.8,  hemoglobin 8.6, MCV 103, RDW 13.7, platelets 376, total bilirubin 0.7,  alkaline phosphatase 56, AST 32, ALT 13, protein 7.6, albumin 3.1,  calcium 9.4, and magnesium 1.5.  UDS positive for cocaine.   HOSPITAL COURSE:  1. Altered mental status.  On admission, [name] [name] serum alcohol      level was negative, but her UDS was positive for cocaine.  We felt      that this was the most likely explanation for her behavior.  She      did not display any seizure activity.  Salicylate level was      negative as well as an acetaminophen level.  A head CT scan was      performed in the emergency department and it did not show any  acute      significant findings.  Of note, her TSH was 0.790 with her free T4      that was 0.68.  This should be followed on an outpatient basis.  On      the day following admission, the patient had returned to baseline,      and she explained that she had smoked marijuana on the night prior      to admission but denied any cocaine use.  She also denied any      alcohol use since her hospital discharge.  Upon further      questioning, she revealed that in her 15s she had some episodes of      paranoia where she thought that someone was out to kill her.  She      did not recall that these episodes were associated with any      substance use.  She was never hospitalized nor has she ever      received any mental health care.  We gave her the necessary      information for her to be seen at mental health if similar symptoms      recur.  2. Hypertension.  Also, she has a history of hypertension and is      usually well controlled on HCTZ and lisinopril.  On admission, she      had stage II hypertension which normalized quickly after 0.1 mg of      clonidine.  This was felt to be secondary to cocaine use,      especially as her tachycardia resolved as well.  3. Mild leukocytosis.  On admission her white blood count was 10.8      which was felt to be secondary to cocaine use.  On the next day,      her white blood count had decreased to 9.1.  She remained afebrile      throughout her hospital course.  4. Macrocytic anemia.  The patient is known to have B12 deficiency for      which she receives monthly B12 injections.  Her baseline hemoglobin      is around 8.  An anemia panel was done and that revealed an iron of      48, TIBC 218  with 22% saturation but an elevated ferritin at 716.      B12 level was above 2000 and RBC folate 1009.   DISCHARGE LABORATORY DATA:  White blood count 9.1, hemoglobin 7.9, and  platelets 359.  Sodium 139, potassium 3.8, chloride 111, bicarb 19, BUN  12,  creatinine 0.60, glucose 143, and calcium 8.5.   DISCHARGE VITAL SIGNS:  Temperature 98.3, blood pressure 156/93, heart  rate 73, and O2 sat 99% on room air.      Olene Craven, M.D.  Electronically Signed      Madaline Guthrie, M.D.  Electronically Signed    MC/MEDQ  D:  12/16/2007  T:  12/17/2007  Job:  [id number]

## 2010-11-01 ENCOUNTER — Encounter: Payer: PRIVATE HEALTH INSURANCE | Admitting: Internal Medicine

## 2010-11-10 ENCOUNTER — Encounter: Payer: Self-pay | Admitting: Internal Medicine

## 2010-11-10 ENCOUNTER — Ambulatory Visit (INDEPENDENT_AMBULATORY_CARE_PROVIDER_SITE_OTHER): Payer: PRIVATE HEALTH INSURANCE | Admitting: Internal Medicine

## 2010-11-10 VITALS — BP 120/79 | HR 91 | Temp 97.7°F | Ht 61.0 in | Wt 86.7 lb

## 2010-11-10 DIAGNOSIS — F329 Major depressive disorder, single episode, unspecified: Secondary | ICD-10-CM | POA: Insufficient documentation

## 2010-11-10 DIAGNOSIS — D518 Other vitamin B12 deficiency anemias: Secondary | ICD-10-CM

## 2010-11-10 DIAGNOSIS — E559 Vitamin D deficiency, unspecified: Secondary | ICD-10-CM

## 2010-11-10 DIAGNOSIS — M7060 Trochanteric bursitis, unspecified hip: Secondary | ICD-10-CM

## 2010-11-10 DIAGNOSIS — M76899 Other specified enthesopathies of unspecified lower limb, excluding foot: Secondary | ICD-10-CM

## 2010-11-10 DIAGNOSIS — F1010 Alcohol abuse, uncomplicated: Secondary | ICD-10-CM

## 2010-11-10 DIAGNOSIS — F3289 Other specified depressive episodes: Secondary | ICD-10-CM

## 2010-11-10 MED ORDER — ERGOCALCIFEROL 1.25 MG (50000 UT) PO CAPS: 50000.0000 [IU] | ORAL_CAPSULE | ORAL | Status: DC

## 2010-11-10 MED ORDER — BUPROPION HCL 100 MG PO TABS: 100.0000 mg | ORAL_TABLET | Freq: Three times a day (TID) | ORAL | Status: DC

## 2010-11-10 MED ORDER — TRAMADOL HCL 50 MG PO TABS: 50.0000 mg | ORAL_TABLET | Freq: Three times a day (TID) | ORAL | Status: DC | PRN

## 2010-11-10 MED ORDER — FLUOXETINE HCL 10 MG PO CAPS: 10.0000 mg | ORAL_CAPSULE | Freq: Every day | ORAL | Status: DC

## 2010-11-10 MED ORDER — MAGNESIUM OXIDE 400 MG PO TABS: 400.0000 mg | ORAL_TABLET | Freq: Two times a day (BID) | ORAL | Status: DC

## 2010-11-10 NOTE — Assessment & Plan Note (Signed)
Gave her tramadol this time. She does not want oxycodone. See Dr. Misty Stanley note for further info from last visit.

## 2010-11-10 NOTE — Progress Notes (Signed)
Subjective:    [name], female    DOB: 02/06/1956, 55 y.o.   MRN: [id number]  Hip Pain    This is a 55 year old female with a history significant for alcoholism, depression, hypertension, and failure to thrive, who presents for follow up for right hip pain. The patient was evaluated for her hip pain in the ED on April 2, and hip x-ray did not demonstrate any degenerative changes. The patient was reportedly having back pain at that time, and lumbar extra demonstrated degenerative changes including retrolisthesis and disc height loss at L5-S1. Pt was evaluated here in the clinic two months ago by Dr. Cathey Endow, and again given NSAIDS for short period of time. Patient seems to have had no benefit from it. She reports significant depression, continued alcohol use but remains evasive about amounts and other drug use. She denies suicidal and homicidal ideation. She reports no interest in DAL and eating. She is very mal nourished appearing. She also refuses mental health doctor referral.   Review of Systems  Constitutional: Negative for fever and chills.  Respiratory: Negative for cough and shortness of breath.   Cardiovascular: Negative for chest pain and palpitations.  Gastrointestinal: Negative for vomiting, diarrhea and constipation.       Objective:   Physical Exam  Constitutional: She appears well-developed and well-nourished.  HENT:  Head: Normocephalic and atraumatic.  Eyes: Pupils are equal, round, and reactive to light.  Cardiovascular: Normal rate, regular rhythm and intact distal pulses.  Exam reveals no gallop and no friction rub.   No murmur heard. Pulmonary/Chest: Effort normal and breath sounds normal. She has no wheezes. She has no rales.  Abdominal: Soft. Bowel sounds are normal. She exhibits no distension. There is no tenderness.  Musculoskeletal: Normal range of motion.       There is tenderness to palpation over the right trochanteric bursa, there is no  tenderness to palpation of the lumbar spine, there is full range of motion without pain of both the lumbar spine and right hip. There is no weakness in the lower extremities, and reflexes are 1+ and symmetric throughout. Straight leg raise is negative bilaterally.  Neurological: She is alert. No cranial nerve deficit.  Skin: No rash noted.        Current Outpatient Prescriptions on File Prior to Visit  Medication Sig Dispense Refill  . folic acid (FOLVITE) 1 MG tablet Take 1 tablet (1 mg total) by mouth daily.  30 tablet  11  . hydrochlorothiazide 25 MG tablet Take 1 tablet (25 mg total) by mouth daily.  30 tablet  11  . omeprazole (PRILOSEC) 40 MG capsule Take 1 capsule (40 mg total) by mouth daily.  30 capsule  11  . Thiamine HCl (VITAMIN B-1) 100 MG tablet Take 1 tablet (100 mg total) by mouth daily.  30 tablet  11  . calcium carbonate (TUMS - DOSED IN MG ELEMENTAL CALCIUM) 500 MG chewable tablet Chew 1 tablet (200 mg of elemental calcium total) by mouth 3 (three) times daily.  90 tablet  11  . DISCONTD: ergocalciferol (VITAMIN D2) 50000 UNITS capsule Take 50,000 Units by mouth once a week.        Marland Kitchen DISCONTD: magnesium oxide (MAG-OX) 400 MG tablet Take 1 tablet (400 mg total) by mouth 2 (two) times daily.  60 tablet  6   Current Facility-Administered Medications on File Prior to Visit  Medication Dose Route Frequency Provider Last Rate Last Dose  . cyanocobalamin ((VITAMIN B-12))  injection 1,000 mcg  1,000 mcg Intramuscular Q30 days Nodira Karimova   1,000 mcg at 11/10/10 1604   Past Medical History  Diagnosis Date  . Anemia, B12 deficiency   . History of acute pancreatitis   . Right knee pain     No recent imaging on chart  . 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 Dr. Su Hilt, [facility]  . Hypertriglyceridemia   . GERD (gastroesophageal reflux disease)   . Vitamin D deficiency   . Subdural hematoma 02/2008     Likely 2/2 trauma from seizure from EtOH withdrawal, chronic in nature, sees Dr. Robyne Askew. Most recent CT head 10/2009 showing stable but persistent hematoma without mass effect.  . History of seizure disorder     Likely 2/2 alcohol abuse  . Hypocalcemia   . Hypomagnesemia   . Failure to thrive     Unclear etiology  . HTN (hypertension)   . Thrombocytopenia   . Anemia, macrocytic   . Hepatomegaly     On exam  . Alcohol abuse      Assessment & Plan:

## 2010-11-10 NOTE — Assessment & Plan Note (Signed)
Low magnesium secondary to poor oral intake and alcoholism. Again talked at length about nutrition. She did not take magnesium supplementation. I reinforced the need to take meds as prescribed.

## 2010-11-10 NOTE — Assessment & Plan Note (Signed)
Started her on buproprion because SSRI and tramadol interaction would make seizure potential high. She also has history of withdrawal seizures. I offered psych referral but she refused.

## 2010-11-10 NOTE — Assessment & Plan Note (Signed)
Very low vitamin D, due to poor oral intake. Represcribe Vit D, recheck after she takes it for a month as prescribed.

## 2010-11-10 NOTE — Assessment & Plan Note (Signed)
Again stressed alcohol cessation. She understands but it has been a struggle for her. I extended assistance in any ways appropriate.

## 2010-11-10 NOTE — Patient Instructions (Signed)
Return in one month Avoid alcohol Congrats for stopping smoking Continue AA meetings

## 2010-11-18 ENCOUNTER — Telehealth: Payer: Self-pay | Admitting: *Deleted

## 2010-11-18 NOTE — Telephone Encounter (Signed)
Pt presents c/o continuing pain in R hip area, she states she has only taken 1 tramadol and it helped but she has not taken any more... Could not give an answer for why, since she presented at 1635 she is told the only option for md to see her is to go to ED, she does not like this idea, she is ask to take the tramadol as directed and an appt is made for mon 6/18 at 0945, she is agreeable to this plan.

## 2010-11-21 ENCOUNTER — Encounter: Payer: Self-pay | Admitting: Internal Medicine

## 2010-11-21 ENCOUNTER — Ambulatory Visit (INDEPENDENT_AMBULATORY_CARE_PROVIDER_SITE_OTHER): Payer: PRIVATE HEALTH INSURANCE | Admitting: Internal Medicine

## 2010-11-21 VITALS — BP 114/81 | HR 99 | Temp 97.0°F | Ht 61.0 in | Wt 84.9 lb

## 2010-11-21 DIAGNOSIS — M25551 Pain in right hip: Secondary | ICD-10-CM

## 2010-11-21 DIAGNOSIS — M25559 Pain in unspecified hip: Secondary | ICD-10-CM

## 2010-11-21 MED ORDER — DICLOFENAC SODIUM 1 % TD GEL: 1.0000 "application " | Freq: Four times a day (QID) | TRANSDERMAL | Status: DC

## 2010-11-21 NOTE — Assessment & Plan Note (Signed)
Significant radicular pain, worsening. Patient is not taking tramadol as prescribed. Instead taking ipuprofen. We discussed pain regimen, and I insisted to decrease ibuprofen use and use tramadol 2-3 times per day in acute worsening. She would need MRI of lumbar spine if pain worsens. Patient benefited from voltaren gel so I will prescribe the same for her.

## 2010-11-21 NOTE — Progress Notes (Signed)
Subjective:    [name], female    DOB: 1956/01/13, 55 y.o.   MRN: [id number]  Hip Pain    This is a 55 year old female with a history significant for alcoholism, depression, hypertension, and failure to thrive, who presents for follow up for right hip pain. The patient was evaluated for her hip pain in the ED on April 2, and hip x-ray did not demonstrate any degenerative changes. The patient was reportedly having back pain at that time, and lumbar xray demonstrated degenerative changes including disc height loss at L5-S1. I had given her tramadol for pain relief on last visit, but patient only took once per night, for total of 5 tablets only. She reports that her pain is worse, making it hard for her to work, and walk. She also takes care of grandchild who is 75 years old and she is worried that the pain pill will knock her out and she will not be able to care for grandchild.  She reports significant depression, continued alcohol use but remains evasive about amounts and other drug use. She denies suicidal and homicidal ideation. She reports no interest in DAL and eating. She is very mal nourished appearing. She also refuses mental health doctor referral.   Review of Systems  Constitutional: Negative for fever and chills.  Respiratory: Negative for cough and shortness of breath.   Cardiovascular: Negative for chest pain and palpitations.  Gastrointestinal: Negative for vomiting, diarrhea and constipation.       Objective:   Physical Exam  Constitutional: She appears well-developed and well-nourished.  HENT:  Head: Normocephalic and atraumatic.  Eyes: Pupils are equal, round, and reactive to light.  Cardiovascular: Normal rate, regular rhythm and intact distal pulses.  Exam reveals no gallop and no friction rub.   No murmur heard. Pulmonary/Chest: Effort normal and breath sounds normal. She has no wheezes. She has no rales.  Abdominal: Soft. Bowel sounds are normal.  She exhibits no distension. There is no tenderness.  Musculoskeletal: Normal range of motion.       There is minimal tenderness to palpation over the right trochanteric bursa, there is no tenderness to palpation of the lumbar spine, there is limited range of motion due to pain of both the lumbar spine and right hip. There is no weakness in the lower extremities, and reflexes are 1+ and symmetric throughout. Straight leg raise is positive on right side.   Neurological: She is alert. No cranial nerve deficit.  Skin: No rash noted.        Current Outpatient Prescriptions on File Prior to Visit  Medication Sig Dispense Refill  . buPROPion (WELLBUTRIN) 100 MG tablet Take 1 tablet (100 mg total) by mouth 3 (three) times daily.  90 tablet  2  . calcium carbonate (TUMS - DOSED IN MG ELEMENTAL CALCIUM) 500 MG chewable tablet Chew 1 tablet (200 mg of elemental calcium total) by mouth 3 (three) times daily.  90 tablet  11  . ergocalciferol (VITAMIN D2) 50000 UNITS capsule Take 1 capsule (50,000 Units total) by mouth once a week.  4 capsule  0  . folic acid (FOLVITE) 1 MG tablet Take 1 tablet (1 mg total) by mouth daily.  30 tablet  11  . hydrochlorothiazide 25 MG tablet Take 1 tablet (25 mg total) by mouth daily.  30 tablet  11  . magnesium oxide (MAG-OX) 400 MG tablet Take 1 tablet (400 mg total) by mouth 2 (two) times daily.  60 tablet  6  . omeprazole (PRILOSEC) 40 MG capsule Take 1 capsule (40 mg total) by mouth daily.  30 capsule  11  . Thiamine HCl (VITAMIN B-1) 100 MG tablet Take 1 tablet (100 mg total) by mouth daily.  30 tablet  11  . traMADol (ULTRAM) 50 MG tablet Take 1 tablet (50 mg total) by mouth every 8 (eight) hours as needed for pain.  30 tablet  0   Current Facility-Administered Medications on File Prior to Visit  Medication Dose Route Frequency Provider Last Rate Last Dose  . cyanocobalamin ((VITAMIN B-12)) injection 1,000 mcg  1,000 mcg Intramuscular Q30 days Nodira Karimova   1,000  mcg at 11/10/10 1604   Past Medical History  Diagnosis Date  . Anemia, B12 deficiency   . History of acute pancreatitis   . Right knee pain     No recent imaging on chart  . 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 Dr. Su Hilt, [facility]  . Hypertriglyceridemia   . GERD (gastroesophageal reflux disease)   . Vitamin D deficiency   . Subdural hematoma 02/2008    Likely 2/2 trauma from seizure from EtOH withdrawal, chronic in nature, sees Dr. Robyne Askew. Most recent CT head 10/2009 showing stable but persistent hematoma without mass effect.  . History of seizure disorder     Likely 2/2 alcohol abuse  . Hypocalcemia   . Hypomagnesemia   . Failure to thrive     Unclear etiology  . HTN (hypertension)   . Thrombocytopenia   . Anemia, macrocytic   . Hepatomegaly     On exam  . Alcohol abuse      Assessment & Plan:

## 2010-11-21 NOTE — Patient Instructions (Signed)
Use voltaren gel as prescribed. Take 2-3 tramadol a day to get adequate control of pain.  Return in 2 weeks.

## 2010-12-05 ENCOUNTER — Encounter: Payer: Self-pay | Admitting: Internal Medicine

## 2010-12-05 ENCOUNTER — Ambulatory Visit (INDEPENDENT_AMBULATORY_CARE_PROVIDER_SITE_OTHER): Payer: PRIVATE HEALTH INSURANCE | Admitting: Internal Medicine

## 2010-12-05 VITALS — BP 110/76 | HR 83 | Temp 97.7°F | Ht 61.0 in | Wt 82.9 lb

## 2010-12-05 DIAGNOSIS — M7060 Trochanteric bursitis, unspecified hip: Secondary | ICD-10-CM

## 2010-12-05 DIAGNOSIS — I10 Essential (primary) hypertension: Secondary | ICD-10-CM

## 2010-12-05 DIAGNOSIS — M25559 Pain in unspecified hip: Secondary | ICD-10-CM

## 2010-12-05 DIAGNOSIS — M25551 Pain in right hip: Secondary | ICD-10-CM

## 2010-12-05 DIAGNOSIS — M76899 Other specified enthesopathies of unspecified lower limb, excluding foot: Secondary | ICD-10-CM

## 2010-12-05 MED ORDER — GABAPENTIN 100 MG PO CAPS: 100.0000 mg | ORAL_CAPSULE | Freq: Three times a day (TID) | ORAL | Status: DC

## 2010-12-05 MED ORDER — TRAMADOL HCL 50 MG PO TABS: 50.0000 mg | ORAL_TABLET | Freq: Three times a day (TID) | ORAL | Status: DC | PRN

## 2010-12-05 NOTE — Assessment & Plan Note (Signed)
Patient continues to have right hip and low back pain.  XRAY of spine in April 2012 showes Grade 1 (approximately 3-4 mm) retrolisthesis and disc height   loss at L5- S1& mild facet joint degenerative changes of the lumbar spine. She states that her pain is tolerable with Tramadol and Voltaren.  She occasionally takes her husband's Gabapentin which helps relieve her pain and would like a prescription for it.  I reviewed her chart and she did have alcohol withdrawal seizure about 1 year ago and states that she has been on rehab in the past 3 months.  I explained to patient in details that Gabapentin can cause serious withdrawal seizures.  She voiced her understanding and stated that she will stop the medication if she start seizing and will notify our office.  I discussed steroid injection and surgical referral but patient refused and only wants medical management -Will continue Tramadol (prescribed 45 tablets today) - Continue Voltaren

## 2010-12-05 NOTE — Patient Instructions (Addendum)
Please continue Tramadol and Voltaren as prescribed Start taking Neurontin (Gabapentin) 1 tablet 3 times per day x 3 days, then 2 tablets 3 times per day  Stop Gabapentin if you start to have seizure and notify our office immediately Follow up in 4 weeks

## 2010-12-05 NOTE — Assessment & Plan Note (Signed)
Well controlled. Continue current medications  

## 2010-12-05 NOTE — Progress Notes (Signed)
55 yo man presents for low back and hip pain x 1.5 months.  States that Tramadol and Voltaren helps but the pain is still there.  In addition, she states that she has been taking her husband's gabapentin and it helps and would like to have a prescription.  She had withdrawal seizure last year but has not had any recently.  She is going through alcohol rehab in the past 3 months and is doing well.   Refuse surgery, steroid injection. She only wants medical treatment.   ROS: as per HPI  PE: General: alert, thin appearing, and cooperative to examination.  Lungs: normal respiratory effort, no accessory muscle use, normal breath sounds, no crackles, and no wheezes. Heart: normal rate, regular rhythm, no murmur, no gallop, and no rub.  Abdomen: soft, non-tender, normal bowel sounds, no distention, no guarding, no rebound tenderness, no hepatomegaly, and no splenomegaly.  Pulses: 2+ DP/PT pulses bilaterally Extremities: No cyanosis, clubbing, edema, Right Hip: + straight leg raising, tenderness to palpation of right hip and lower back.   Neurologic: alert & oriented X3, cranial nerves II-XII intact, strength normal in all extremities, sensation intact to light touch, and gait normal.   Psych: Oriented X3, memory intact for recent and remote, normally interactive, good eye contact, not anxious appearing, and not depressed appearing.

## 2010-12-15 ENCOUNTER — Telehealth: Payer: Self-pay | Admitting: *Deleted

## 2010-12-15 NOTE — Telephone Encounter (Signed)
Call to pt to see if she has cleared account so that an appointment can be scheduled.

## 2010-12-20 ENCOUNTER — Ambulatory Visit (INDEPENDENT_AMBULATORY_CARE_PROVIDER_SITE_OTHER): Payer: PRIVATE HEALTH INSURANCE | Admitting: Internal Medicine

## 2010-12-20 ENCOUNTER — Encounter: Payer: Self-pay | Admitting: Internal Medicine

## 2010-12-20 VITALS — BP 119/84 | HR 99 | Temp 101.5°F | Wt 81.6 lb

## 2010-12-20 DIAGNOSIS — D518 Other vitamin B12 deficiency anemias: Secondary | ICD-10-CM

## 2010-12-20 DIAGNOSIS — R509 Fever, unspecified: Secondary | ICD-10-CM | POA: Insufficient documentation

## 2010-12-20 DIAGNOSIS — M25559 Pain in unspecified hip: Secondary | ICD-10-CM

## 2010-12-20 DIAGNOSIS — M25551 Pain in right hip: Secondary | ICD-10-CM

## 2010-12-20 DIAGNOSIS — E785 Hyperlipidemia, unspecified: Secondary | ICD-10-CM

## 2010-12-20 LAB — LIPID PANEL
Cholesterol: 103 mg/dL (ref 0–200)
Total CHOL/HDL Ratio: 2.6 Ratio
Triglycerides: 157 mg/dL — ABNORMAL HIGH (ref <150)
VLDL: 31 mg/dL (ref 0–40)

## 2010-12-20 LAB — CBC WITH DIFFERENTIAL/PLATELET
Eosinophils Relative: 0 % (ref 0–5)
HCT: 27 % — ABNORMAL LOW (ref 36.0–46.0)
Lymphocytes Relative: 11 % — ABNORMAL LOW (ref 12–46)
Lymphs Abs: 1.5 10*3/uL (ref 0.7–4.0)
MCV: 104.2 fL — ABNORMAL HIGH (ref 78.0–100.0)
Monocytes Absolute: 1.4 10*3/uL — ABNORMAL HIGH (ref 0.1–1.0)
RBC: 2.59 MIL/uL — ABNORMAL LOW (ref 3.87–5.11)
WBC: 13.3 10*3/uL — ABNORMAL HIGH (ref 4.0–10.5)

## 2010-12-20 MED ORDER — HYDROCODONE-ACETAMINOPHEN 5-500 MG PO TABS: 1.0000 | ORAL_TABLET | Freq: Four times a day (QID) | ORAL | Status: DC | PRN

## 2010-12-20 MED ORDER — PREDNISONE (PAK) 10 MG PO TABS: 10.0000 mg | ORAL_TABLET | Freq: Every day | ORAL | Status: AC

## 2010-12-20 MED ORDER — CYANOCOBALAMIN 1000 MCG/ML IJ SOLN: 1000.0000 ug | Freq: Once | INTRAMUSCULAR | Status: DC

## 2010-12-20 NOTE — Progress Notes (Signed)
  Subjective:    [name], female    DOB: 01-20-1956, 55 y.o.   MRN: [id number]  HPI1.1. C/o right hip pain which is burning and constant, starts in right lower back and refers down anterior thigh to her knee. No weakness noted. Patient states that her pain started 3 months ago without any precipitating factors. Patient ws evaluated by Dr. Anselm Jungling on 12/05/10 and was given Tramadol, Ibuprofen and Gabapentin was added --patient denies any relief. Reports that she is unable to sleep and has a poor appetite because of it. 2. Fever that started the night prior to OV; body aches "all over," dry cough with several episode of vomitting. Denies any sick contacts or travel. Patient quit smoking 17 days ago. No alochol intake for over 150 days.     Review of Systems  Constitutional: Positive for fever, chills, appetite change, fatigue and unexpected weight change. Negative for diaphoresis and activity change.  HENT: Positive for rhinorrhea. Negative for hearing loss, ear pain, nosebleeds, congestion, facial swelling, sneezing, neck pain, neck stiffness, postnasal drip, tinnitus and ear discharge.   Eyes: Negative.   Respiratory: Positive for wheezing. Negative for apnea, cough, choking, chest tightness, shortness of breath and stridor.   Genitourinary: Negative.   Musculoskeletal: Positive for back pain and arthralgias. Negative for joint swelling and gait problem.  Neurological: Negative.   Hematological: Negative.   Psychiatric/Behavioral: Negative.        Objective:   Physical Exam General: Vital signs reviewed and noted. Well-developed, well-nourished, in no acute distress; alert, appropriate and cooperative throughout examination.  Head: Normocephalic, atraumatic.  Neck: No deformities, masses, or tenderness noted.  Lungs:  Normal respiratory effort. Clear to auscultation BL without crackles; bilateral wheezes to lower lobes noted.  Heart: RRR. S1 and S2 normal without gallop,  murmur, or rubs.  Abdomen:  BS normoactive. Soft, Nondistended, non-tender.  No masses or organomegaly.  Extremities: No pretibial edema. Right SI joint point TTP; FROM, strength 5+/5 bilatearlly proximally and sitally; DTR's intact bilatearlly; no Babinski bilatearlly. Ski: no rashes or lesions or bruises.          Assessment & Plan:

## 2010-12-20 NOTE — Progress Notes (Signed)
Copy of pain contract given to pt. 

## 2010-12-20 NOTE — Progress Notes (Signed)
Addended by: Hassan Buckler on: 12/20/2010 03:13 PM   Modules accepted: Orders

## 2010-12-20 NOTE — Patient Instructions (Signed)
Stop taking Gabapentin, Ibuprofen and Tramadol. Refill a Rx for prednisone and Vicodin. Do not drive if feel sleepy while taking theses medications. Call with any questions. Follow up in 4-8 weeks or sooner if no improvement.

## 2010-12-20 NOTE — Assessment & Plan Note (Signed)
Patient has Right SI joint point tenderness with a radiculopathy to an anterior aspect of her thigh down to the knee. May have a Myralgia persthetica component. Discussed with Dr. Phillips Odor: will D/C tramadol and Gabapentin given history of seizures and inefficacy. Will d/C Ibuprofen due to Hx of anemia and gastritis. Will continue with Voltaren gel PRN. Start Vicodin 5/500 I PO q 6 hrs PRN; Sig 120, no RF. Contract for controlled substances discussed with the patient; all questions answered in full; and signed. Referred to a Sports medicine for Right SI joint fluoroscopy. Patient refused a referral for a PT.

## 2010-12-20 NOTE — Assessment & Plan Note (Signed)
Will check CBC today. Vit B12 Im injection x1.

## 2010-12-21 ENCOUNTER — Inpatient Hospital Stay (HOSPITAL_COMMUNITY): Payer: PRIVATE HEALTH INSURANCE

## 2010-12-21 ENCOUNTER — Encounter: Payer: Self-pay | Admitting: Internal Medicine

## 2010-12-21 ENCOUNTER — Inpatient Hospital Stay (HOSPITAL_COMMUNITY)
Admission: AD | Admit: 2010-12-21 | Discharge: 2010-12-26 | DRG: 204 | Disposition: A | Discharge status: Home / Self Care | Payer: PRIVATE HEALTH INSURANCE | Source: Ambulatory Visit | Attending: Internal Medicine | Admitting: Internal Medicine

## 2010-12-21 ENCOUNTER — Other Ambulatory Visit: Payer: Self-pay | Admitting: Internal Medicine

## 2010-12-21 DIAGNOSIS — E43 Unspecified severe protein-calorie malnutrition: Secondary | ICD-10-CM | POA: Diagnosis present

## 2010-12-21 DIAGNOSIS — D649 Anemia, unspecified: Secondary | ICD-10-CM | POA: Diagnosis present

## 2010-12-21 DIAGNOSIS — R05 Cough: Principal | ICD-10-CM | POA: Diagnosis present

## 2010-12-21 DIAGNOSIS — R627 Adult failure to thrive: Secondary | ICD-10-CM | POA: Diagnosis present

## 2010-12-21 DIAGNOSIS — J189 Pneumonia, unspecified organism: Secondary | ICD-10-CM | POA: Diagnosis present

## 2010-12-21 DIAGNOSIS — R197 Diarrhea, unspecified: Secondary | ICD-10-CM | POA: Diagnosis present

## 2010-12-21 DIAGNOSIS — R64 Cachexia: Secondary | ICD-10-CM | POA: Diagnosis present

## 2010-12-21 DIAGNOSIS — Z79899 Other long term (current) drug therapy: Secondary | ICD-10-CM

## 2010-12-21 DIAGNOSIS — Y9210 Unspecified residential institution as the place of occurrence of the external cause: Secondary | ICD-10-CM | POA: Diagnosis present

## 2010-12-21 DIAGNOSIS — F1411 Cocaine abuse, in remission: Secondary | ICD-10-CM | POA: Diagnosis present

## 2010-12-21 DIAGNOSIS — F1021 Alcohol dependence, in remission: Secondary | ICD-10-CM | POA: Diagnosis present

## 2010-12-21 DIAGNOSIS — M25559 Pain in unspecified hip: Secondary | ICD-10-CM | POA: Diagnosis present

## 2010-12-21 DIAGNOSIS — T50995A Adverse effect of other drugs, medicaments and biological substances, initial encounter: Secondary | ICD-10-CM | POA: Diagnosis present

## 2010-12-21 DIAGNOSIS — K861 Other chronic pancreatitis: Secondary | ICD-10-CM | POA: Diagnosis present

## 2010-12-21 DIAGNOSIS — Z681 Body mass index (BMI) 19 or less, adult: Secondary | ICD-10-CM

## 2010-12-21 DIAGNOSIS — J40 Bronchitis, not specified as acute or chronic: Secondary | ICD-10-CM | POA: Diagnosis present

## 2010-12-21 DIAGNOSIS — E876 Hypokalemia: Secondary | ICD-10-CM | POA: Diagnosis present

## 2010-12-21 DIAGNOSIS — R059 Cough, unspecified: Principal | ICD-10-CM | POA: Diagnosis present

## 2010-12-21 LAB — URINE MICROSCOPIC-ADD ON

## 2010-12-21 LAB — COMPLETE METABOLIC PANEL WITH GFR
AST: 103 U/L — ABNORMAL HIGH (ref 0–37)
Alkaline Phosphatase: 171 U/L — ABNORMAL HIGH (ref 39–117)
BUN: 7 mg/dL (ref 6–23)
Creat: 0.95 mg/dL (ref 0.50–1.10)

## 2010-12-21 LAB — URINALYSIS, ROUTINE W REFLEX MICROSCOPIC
Hgb urine dipstick: NEGATIVE
Nitrite: NEGATIVE
Specific Gravity, Urine: 1.022 (ref 1.005–1.030)
pH: 5.5 (ref 5.0–8.0)

## 2010-12-21 NOTE — Progress Notes (Signed)
Got a call from [facility] lab at 11 30 pm for critical lab result of Calcium- 5.8 Pt was seen by Dr. Denton Meek on 7/17. Please f/u for any symptoms of Hypocalcemia and call patient in if needed.

## 2010-12-21 NOTE — H&P (Signed)
Hospital Admission Note Date: 12/21/2010  Patient name: [name] [name] Medical record number: [id number] Date of birth: June 02, 1956 Age: 56 y.o. Gender: female PCP: Deatra Robinson, MD  Medical Service: Internal Medicine  Attending physician:  Dr. Doneen Poisson    Resident (R2/R3):  Dr. Denton Meek   Pager: [phone] Resident (R1):  Dr. Candy Sledge   Pager: [phone]  Chief Complaint: Failure to thrive and Fever  History of Present Illness:  [name] [name] is a 55 year old woman with past medical history significant for hypocalcemia 2/2 Vit D deficiency, hypomagnesemia, failure to thrive, hx of alcoholism, depression and HTN, who presents for right hip pain, and decreased appetite. The patient initially saw Dr. Denton Meek during clinic visit 12/20/2010 for complaints of right hip pain. The right hip pain is described as burning and constant. The pain is located in the right hip and radiates down to anterior thigh and knee. Though patient did not complain of weakness, she did mention numbness and tingling in both of her extremities. No bowel or urinary incontinence reported. The patient states the pain began approximately 2.5 months ago. She had a xray of the right hip in April 2012 which did not show any acute abnormalities, however lumbar spine films were consistent with degenerative changes.   The patient also complains of flu-like symptoms which began two days prior to admission. She complains of nose running, body aches, fevers, chills, and cough with productive sputum that is clear in colour. Of note, pt was febrile during her clinic visit with a temperature of 101.5. The patient denies urinary frequency, hesitancy and burning. She also denies constipation and diarrhea, but does feel nauseated with no vomiting. No sick contacts.   The patient notes decreased appetite with associated weight loss. Patient has had a 35 lbs weight loss over the last 3 years. She has had a 7lbs weight loss over the last 3  months. The patient states she stopped eating secondary to irritation caused by ibuprofen and gabapentin.    Marvene Staff K  Home Medication Instructions HAR:   Printed on:12/21/10 1953  Medication Information                    buPROPion (WELLBUTRIN) 100 MG tablet Take 1 tablet (100 mg total) by mouth 3 (three) times daily.           calcium carbonate (TUMS - DOSED IN MG ELEMENTAL CALCIUM) 500 MG chewable tablet Chew 1 tablet (200 mg of elemental calcium total) by mouth 3 (three) times daily.           diclofenac sodium (VOLTAREN) 1 % GEL Apply 1 application topically 4 (four) times daily.           ergocalciferol (VITAMIN D2) 50000 UNITS capsule Take 1 capsule (50,000 Units total) by mouth once a week.           folic acid (FOLVITE) 1 MG tablet Take 1 tablet (1 mg total) by mouth daily.           hydrochlorothiazide 25 MG tablet Take 1 tablet (25 mg total) by mouth daily.           HYDROcodone-acetaminophen (VICODIN) 5-500 MG per tablet Take 1 tablet by mouth every 6 (six) hours as needed for pain.           magnesium oxide (MAG-OX) 400 MG tablet Take 1 tablet (400 mg total) by mouth 2 (two) times daily.           omeprazole (PRILOSEC)  40 MG capsule Take 1 capsule (40 mg total) by mouth daily.           predniSONE (DELTASONE) 10 MG tablet pack Take 1 tablet (10 mg total) by mouth daily. Take tablets by mouth daily for 2 days, then 5 tablets by mouth daily for 2 days, then 4 tablets by mouth daily for 2 days, then 3 tablets by mouth daily for 2 days, then 2 tablets by mouth daily for 2 days, then 1 tablet by mouth x2 day. Then stop.           Thiamine HCl (VITAMIN B-1) 100 MG tablet Take 1 tablet (100 mg total) by mouth daily.             Allergies: Amitriptyline hcl and Doxycycline hyclate  Past Medical History  Diagnosis Date  . Anemia, B12 deficiency - B12 shot given 12/19/2010   . History of acute pancreatitis 2/2 alcholism   . Right knee pain     Xray  09/2010: prepatellar soft tissue swelling and laceration without fracture or effusion   . 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 Dr. Su Hilt, [facility]  . Hypertriglyceridemia - Triglycerides 152, LDL 32, HDL 40 12/19/2010   . GERD (gastroesophageal reflux disease)   . Vitamin D deficiency - previously treated with weekly vit d injections    . Subdural hematoma 02/2008    Likely 2/2 trauma from seizure from EtOH withdrawal, chronic in nature, sees Dr. Robyne Askew. Most recent CT head 10/2009 showing stable but persistent hematoma without mass effect.  . History of seizure disorder     Likely 2/2 alcohol abuse  . Hypocalcemia 2/2 low Mg and Vit D deficiency    . Hypomagnesemia   . Failure to thrive     Unclear etiology  . HTN (hypertension)   . Hx of Thrombocytopenia - PLT normal in May 2011    . Anemia, macrocytic   . Hepatomegaly     On exam  . Alcohol abuse     No past surgical history on file.  Family History  Problem Relation Age of Onset  . Cancer Mother     Died from stomach cancer and "flesh eating rash  . Heart failure Father     Died in 32s from an MI  . Alcohol abuse Sister     Twin sister drinks a lot  . Stroke Brother     Has 7 brothers, 1 with CVA    History   Social History  . Marital Status: Married    Number of Children: 3 - 57 years old  . Years of Education: Completed 11th grade   Occupational History  . On disability for last 15 years - has medicaid and medicare   Social History Main Topics  . Smoking status: Former Smoker     Types: Cigarettes    Quit date: 09/20/2010  . Smokeless tobacco: Not on file  . Alcohol Use: No     In treatment at the [facility].  . Drug Use: No  . Sexually Active: Not on file   Other Topics Concern  . Not on file   Social History Narrative   Lives with her significant other and 2 grandchildren.Has 7 brothers and 4 sisters, 1 twin  sister.Unemployed, worked in [employer].     Review of Systems: Pertinent items are noted in HPI.  Physical Exam:  T:99.3  BP:  HR:  O2  sats:  R:    General appearance: alert and cooperative and cachectic  Head: Normocephalic, without obvious abnormality, atraumatic Eyes: negative findings: conjunctivae and sclerae normal and pupils equal, round, reactive to light and accomodation Throat: lips, mucosa, and tongue normal; teeth and gums normal Neck: no adenopathy, no carotid bruit, no JVD, supple, symmetrical, trachea midline and thyroid not enlarged, symmetric, no tenderness/mass/nodules Lungs: clear to auscultation bilaterally with decreased breath sounds at lower lung bases, no wheezes heard Heart: tachycardic, no M/G/R Abdomen: soft, non-tender; bowel sounds normal; no masses,  no organomegaly Extremities: extremities normal, atraumatic, no cyanosis or edema, normal ROM Left hip and leg, slightly decreased range of motion when right hip adducted, no warmth or erythema over either hip joints, right hip tender to palpation Pulses: 2+ and symmetric Skin: Skin color, texture, turgor normal. No rashes or lesions Neurologic: Grossly normal   Lab results: pending  Imaging results:  pending   Assessment & Plan by Problem:  1.) Right hip pain - exact etiology unknown. Differential diagnosis includes arthritis vs lateral nerve entrapment vs complex regional pain syndrome. It is unlikely CRPS as patient denies any recent trauma and pain is not described as burning.  -Will manage pain with Vicodin  -Will defer to primary team regarding further imaging or nerve conduction studies   2.) Hypocalcemia - this is a chronic long-standing problem secondary to Vitamin D deficiency and hypomagnesemia. Patient's corrected Calcium was 6.7 which is lower than her baseline.  -Will check Mg level -Check ionized Ca, PTH, and Vit D levels   3.) Failure to thrive - unknown etiology. Presume it is  multifactorial with decreased po intake, hypoMg, B12 deficiency, and history of alcohol abuse. Albumin is 2.9 today. Malignancy is on differential given history of tobacco abuse, night sweats, and weight loss.  -Ensure for nutrition -Will check HIV antibody and TSH -NaHCO3 TID -Will defer to primary team for nutrition consult -Will hold off on further imaging like CT scan for malignancy work-up  4.) Hx of Alcoholism and drug abuse - patient reports being sober for 118 days.  -Continue thiamine and folate  -UDS  5.) Fever and cough - likely viral URI given recent onset of symptoms. -Will check blood cultures -Will check CXR, U/A for further evaluation   6.) Anemia - Hgb baseline is difficult to determine as it fluctuates. More recently Hgb has been between 10-11. Hgb is currently 8.8. Given patient's recent complaints of abdominal irritation, there is concern for upper GI bleed.  -Avoid NSAIDs -Protonix 40 mg po qday -FOBT -Defer to primary team on whether to consult GI, no signs of active bleeding, thus will hold off -no role for transfusion as patient is not symptomatic  -Check anemia panel  VTE Prophylaxis: Lovenox

## 2010-12-22 DIAGNOSIS — R509 Fever, unspecified: Secondary | ICD-10-CM

## 2010-12-22 DIAGNOSIS — D649 Anemia, unspecified: Secondary | ICD-10-CM

## 2010-12-22 LAB — URINALYSIS, ROUTINE W REFLEX MICROSCOPIC
Bilirubin Urine: NEGATIVE
Glucose, UA: NEGATIVE mg/dL
Hgb urine dipstick: NEGATIVE
Ketones, ur: NEGATIVE mg/dL
Protein, ur: NEGATIVE mg/dL
Urobilinogen, UA: 0.2 mg/dL (ref 0.0–1.0)

## 2010-12-22 LAB — URINE MICROSCOPIC-ADD ON

## 2010-12-22 LAB — COMPREHENSIVE METABOLIC PANEL
AST: 44 U/L — ABNORMAL HIGH (ref 0–37)
Albumin: 2 g/dL — ABNORMAL LOW (ref 3.5–5.2)
Alkaline Phosphatase: 147 U/L — ABNORMAL HIGH (ref 39–117)
BUN: 8 mg/dL (ref 6–23)
Chloride: 109 mEq/L (ref 96–112)
Potassium: 2.9 mEq/L — ABNORMAL LOW (ref 3.5–5.1)
Total Bilirubin: 0.1 mg/dL — ABNORMAL LOW (ref 0.3–1.2)

## 2010-12-22 LAB — HIV ANTIBODY (ROUTINE TESTING W REFLEX): HIV: NONREACTIVE

## 2010-12-22 LAB — CBC
HCT: 23.4 % — ABNORMAL LOW (ref 36.0–46.0)
Platelets: 132 10*3/uL — ABNORMAL LOW (ref 150–400)
RDW: 12.2 % (ref 11.5–15.5)
WBC: 13 10*3/uL — ABNORMAL HIGH (ref 4.0–10.5)

## 2010-12-22 LAB — DRUGS OF ABUSE SCREEN W/O ALC, ROUTINE URINE
Barbiturate Quant, Ur: NEGATIVE
Cocaine Metabolites: NEGATIVE
Creatinine,U: 263 mg/dL
Marijuana Metabolite: NEGATIVE

## 2010-12-22 LAB — IRON AND TIBC
Saturation Ratios: 20 % (ref 20–55)
TIBC: 117 ug/dL — ABNORMAL LOW (ref 250–470)

## 2010-12-22 LAB — VITAMIN B12: Vitamin B-12: >2000 pg/mL — ABNORMAL HIGH (ref 211–911)

## 2010-12-22 LAB — TSH: TSH: 1.71 u[IU]/mL (ref 0.350–4.500)

## 2010-12-23 LAB — HEPATIC FUNCTION PANEL
AST: 45 U/L — ABNORMAL HIGH (ref 0–37)
Bilirubin, Direct: <0.1 mg/dL (ref 0.0–0.3)
Total Bilirubin: 0.1 mg/dL — ABNORMAL LOW (ref 0.3–1.2)

## 2010-12-23 LAB — BASIC METABOLIC PANEL
BUN: 7 mg/dL (ref 6–23)
Calcium: 5.4 mg/dL — CL (ref 8.4–10.5)
Chloride: 112 mEq/L (ref 96–112)
GFR calc Af Amer: >60 mL/min (ref >60)
GFR calc non Af Amer: >60 mL/min (ref >60)
Potassium: 3.5 mEq/L (ref 3.5–5.1)
Sodium: 141 mEq/L (ref 135–145)

## 2010-12-23 LAB — RENAL FUNCTION PANEL
CO2: 20 mEq/L (ref 19–32)
Calcium: 6.1 mg/dL — CL (ref 8.4–10.5)
Creatinine, Ser: 0.89 mg/dL (ref 0.50–1.10)
GFR calc non Af Amer: >60 mL/min (ref >60)
Glucose, Bld: 124 mg/dL — ABNORMAL HIGH (ref 70–99)
Phosphorus: 3.3 mg/dL (ref 2.3–4.6)

## 2010-12-23 LAB — CBC
HCT: 23.2 % — ABNORMAL LOW (ref 36.0–46.0)
Hemoglobin: 7.9 g/dL — ABNORMAL LOW (ref 12.0–15.0)
MCHC: 34.1 g/dL (ref 30.0–36.0)
RBC: 2.28 MIL/uL — ABNORMAL LOW (ref 3.87–5.11)
WBC: 9.7 10*3/uL (ref 4.0–10.5)

## 2010-12-23 LAB — OCCULT BLOOD X 1 CARD TO LAB, STOOL: Fecal Occult Bld: NEGATIVE

## 2010-12-23 LAB — PTH, INTACT AND CALCIUM
Calcium, Total (PTH): 5.6 mg/dL — CL (ref 8.4–10.5)
PTH: 91.9 pg/mL — ABNORMAL HIGH (ref 14.0–72.0)

## 2010-12-23 LAB — PROTIME-INR
INR: 1.36 (ref 0.00–1.49)
Prothrombin Time: 17 seconds — ABNORMAL HIGH (ref 11.6–15.2)

## 2010-12-23 LAB — MAGNESIUM: Magnesium: 0.9 mg/dL — CL (ref 1.5–2.5)

## 2010-12-23 LAB — PHOSPHORUS: Phosphorus: 2.9 mg/dL (ref 2.3–4.6)

## 2010-12-24 LAB — RENAL FUNCTION PANEL
BUN: 8 mg/dL (ref 6–23)
Chloride: 107 mEq/L (ref 96–112)
Glucose, Bld: 80 mg/dL (ref 70–99)
Potassium: 4 mEq/L (ref 3.5–5.1)

## 2010-12-24 LAB — CBC
HCT: 22.8 % — ABNORMAL LOW (ref 36.0–46.0)
MCV: 99.6 fL (ref 78.0–100.0)
RBC: 2.29 MIL/uL — ABNORMAL LOW (ref 3.87–5.11)
WBC: 7.7 10*3/uL (ref 4.0–10.5)

## 2010-12-24 LAB — VITAMIN D 1,25 DIHYDROXY: Vitamin D2 1, 25 (OH)2: 28 pg/mL

## 2010-12-25 LAB — RENAL FUNCTION PANEL
CO2: 28 mEq/L (ref 19–32)
Chloride: 103 mEq/L (ref 96–112)
GFR calc Af Amer: >60 mL/min (ref >60)
GFR calc non Af Amer: >60 mL/min (ref >60)
Potassium: 4.3 mEq/L (ref 3.5–5.1)
Sodium: 138 mEq/L (ref 135–145)

## 2010-12-26 LAB — BASIC METABOLIC PANEL
CO2: 28 mEq/L (ref 19–32)
Calcium: 7.6 mg/dL — ABNORMAL LOW (ref 8.4–10.5)
Sodium: 135 mEq/L (ref 135–145)

## 2010-12-26 LAB — CBC
MCH: 34.6 pg — ABNORMAL HIGH (ref 26.0–34.0)
Platelets: 236 10*3/uL (ref 150–400)
RBC: 2.28 MIL/uL — ABNORMAL LOW (ref 3.87–5.11)
WBC: 7.9 10*3/uL (ref 4.0–10.5)

## 2010-12-26 LAB — RETICULIN ANTIBODIES, IGA W TITER: Reticulin Ab, IgA: NEGATIVE

## 2010-12-26 LAB — MAGNESIUM: Magnesium: 1.3 mg/dL — ABNORMAL LOW (ref 1.5–2.5)

## 2010-12-26 LAB — HEPATITIS PANEL, ACUTE
HCV Ab: NEGATIVE
Hep A IgM: NEGATIVE

## 2010-12-27 LAB — GLIADIN ANTIBODIES, SERUM
Gliadin IgA: 14.7 U/mL (ref <20)
Gliadin IgG: 13.4 U/mL (ref <20)

## 2010-12-28 ENCOUNTER — Ambulatory Visit (INDEPENDENT_AMBULATORY_CARE_PROVIDER_SITE_OTHER): Payer: PRIVATE HEALTH INSURANCE | Admitting: Family Medicine

## 2010-12-28 ENCOUNTER — Encounter: Payer: Self-pay | Admitting: Family Medicine

## 2010-12-28 VITALS — BP 111/79 | HR 101 | Ht 61.0 in | Wt 81.0 lb

## 2010-12-28 DIAGNOSIS — M533 Sacrococcygeal disorders, not elsewhere classified: Secondary | ICD-10-CM | POA: Insufficient documentation

## 2010-12-28 LAB — CULTURE, BLOOD (ROUTINE X 2)
Culture  Setup Time: 201207190111
Culture: NO GROWTH

## 2010-12-28 MED ORDER — TRIAMCINOLONE ACETONIDE 40 MG/ML IJ SUSP: 40.0000 mg | Freq: Once | INTRAMUSCULAR | Status: DC

## 2010-12-28 NOTE — Progress Notes (Signed)
  Subjective:    [name], female    DOB: 1956-01-23, 55 y.o.   MRN: [id number]  HPI  55 y/o female is here with low back pain radiating around the hip into the groin x 3months.  No trauma.  Pain is worse with long sitting or long standing.     Review of Systems No weakness, saddle anesthesia or incontinence    Objective:   Physical Exam   Back Exam: Tenderness to palpation over the right SI joint Good mobility of the SI with pelvic rocking Pain radiating to groin with FABER Pain with lateral rotation to the right  Back otherwise non-tender to palpation. Lumber spine ROM is full Knee and ankle reflexes diminished but equal bilaterally Global low bulk in muscles and strength is c/w this low bulk  Consent obtained and verified. Cleansed with alcohol. Topical analgesic spray: Ethyl chloride. Joint: Right SI Approached in typical fashion with patient standing with hip externally rotated Completed without difficulty Meds: 40mg  kenalog and 4cc of lidocaine without epinephrine Aftercare instructions and Red flags advised.    Assessment & Plan:

## 2010-12-28 NOTE — Assessment & Plan Note (Signed)
SI joint injected today.  She can follow up in 3 months for another injection if her symptoms return.

## 2011-01-02 NOTE — Discharge Summary (Addendum)
[name], [name] [name] NO.:  [id number]  MEDICAL RECORD NO.:  [id number]  LOCATION:  3012                         FACILITY:  [facility]  PHYSICIAN:  Blanch Media, M.D.DATE OF BIRTH:  Aug 06, 1955  DATE OF ADMISSION:  12/21/2010 DATE OF DISCHARGE:  12/26/2010                              DISCHARGE SUMMARY   DISCHARGE DIAGNOSES: 1. Fever and cough. 2. Hypocalcemia. 3. Hypomagnesemia. 4. Cachexia and protein malnourishment. 5. Right sacroiliac joint pain. 6. Anemia, chronic. 7. Hypokalemia.  DISCHARGE MEDICATIONS:  New medications: 1. Tylenol 325 mg by mouth taken 2 pills for a total of 650 mg every 6     hours as needed for pain.  She is not to take more than 3 g or 9     regular strength 325 mg tablets in 24 hours. 2. Calcium carbonate 1250 mg, she is to take 500 mg by mouth 3 times a     day with meals. 3. Ensure twice daily with meals. 4. Percocet 5-325, she is to take 1 to 1-1/2 tablets by mouth every 6     hours as needed for pain.  She is instructed that Percocet contains     the same amount of acetaminophen as acetaminophen tablets and she     is to decrease her acetaminophen dosage by 1 tablet for every     Percocet she takes for a total of less than 3 g every 24 hours of     acetaminophen in total. 5. Sodium bicarbonate 650 mg tablets, she is to take 3 time a day. 6. She is to take new medication of magnesium oxide 400 mg tablets.     She is to take the 800 mg for 2 pills at breakfast.  She is to take     400 mg of 1 tablet at lunch and 400 mg 1 tablet at dinner.  She is to continue folic acid 1 mg by mouth daily, omeprazole 40 mg 1 tablet by mouth daily, vitamin D1 100 mg 1 tablet by mouth daily, vitamin D2 50,000 units by mouth every week on Thursdays.  She is to take and vitamin B12 1000 mcg 1 injection subcutaneously monthly.  She is to stop taking the following medical medications:  Mag-Ox 400 as previously dosed and hydrochlorothiazide 25  mg 1 tablet by mouth daily.  DISPOSITION AND FOLLOWUP:  [name] [name] Was discharged from [facility] on December 26, 2010, in stable and improved condition.  Her fever had resolved.  Her malnourishment was being supplemented with Ensure and is to be worked up further as an outpatient.  Her pain was controlled on Percocet.  Her magnesium level was slightly low, but supplemented prior to discharge.  She was not dizzy or in intolerable pain.  She is to follow up with Dr. Thad Ranger in the [facility] on January 05, 2011, at 11:15 p.m.  At that time, she will need the following checked: 1. BMET with magnesium and calcium. 2. Also, the initial workup for her malnourishment was performed in     the hospital and likely that her celiac panel will have returned at     this is visit.  Please follow up on these results.  If the celiac     panel is negative for celiac disease, she should be started on a     trial therapy of pancreatic enzymes as well as supplementation of     fat-soluble vitamins A, D, E, and K. 3. She will need evaluation of her blood pressure as we discontinued     her hydrochlorothiazide during this admission due to low blood     pressure.  PROCEDURES PERFORMED:  Plain radiograph of the chest dated December 21, 2010.  Impression was larger lung volumes.  No acute cardiopulmonary abnormality.  CONSULTATIONS:  None.  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  She presented for evaluation of right hip pain, fevers, and decreased appetite.  She described the hip pain as burning and constant radiating down the right hip to the anterior thigh and knee.  She does not complain of any weakness.  She did mention  numbness and tingling in both her lower extremities.  She denied any bowel or urinary incontinence. Her pain began approximately 2.5 months ago.  Lumbar spine films performed in April 2012 were consistent with degenerative changes.  Of note, [name] [name] also was complaining of flu-like symptoms which began 2 days prior to admission.  She complained of runny nose, body aches, fevers, chills, cough, and productive sputum that was clear in color. She was afebrile during her clinic visit with Dr. Denton Meek on December 20, 2010, to a temperature of 101.5.  The patient denies any urinary frequency, hesitancy, and burning.  She denied any constipation or diarrhea.  She did feel nauseated, but did not vomit.  She denied any sick contacts.  The patient also had decreased appetite and associated weight loss.  She has lost a total of 35 pounds over the last 3 years.  She has also had a 7-pound weight loss over the last 3 months.  The patient states she stopped eating secondary to irritation caused by ibuprofen and gabapentin.  ADMITTING PHYSICAL EXAMINATION:  VITAL SIGNS:  Temperature 101.3, blood pressure 107/73, heart rate 109, O2 sat 99% on room air, and respirations 19 per minute. GENERAL:  She was alert and operative, cachectic female. HEAD:  Normocephalic without abnormalities or trauma. EYES:  Conjunctivae and sclerae were normal without signs of anemia or jaundice.  Her pupils were equal, round, and reactive to light and accommodation.  There was no erythema or irritation of the oropharynx. No exudate appreciated. NECK:  No adenopathy, carotid bruit, JVD, or thyromegaly. LUNGS:  Clear to auscultation bilaterally with decreased breath sounds at the bases.  No wheezes heard. HEART:  Tachycardic.  No murmurs, gallops, or rubs. ABDOMEN:  Soft and nontender.  Bowel sounds present.  No masses or organomegaly. EXTREMITIES:  Normal range of motion of the left hip and leg, slightly decreased motion  when right hip was adducted.  No warmth or erythema over either hip.  The right hip was tender to palpation.  Pulses were 2+ and symmetric bilaterally and throughout. SKIN:  Normal without rashes or lesions. NEUROLOGIC:  Grossly intact.  No focal deficits.  ADMISSION LABORATORY DATA:  CBC:  White count was 13, hemoglobin was 8.1, hematocrit 23.4, and platelet count was 132.  CMP:  Sodium was 139, potassium was 2.9, chloride was 109, bicarbonate was 18, BUN  was 8, creatinine was 0.95, and glucose was 90.  Bilirubin was 0.1, alk phos was 147, AST was 44, ALT was 12, total protein was 6.5, and albumin was 2.  Calcium was 5.5.  Ionized calcium was 0.81.  Magnesium was 0.9.  TSH was 1.71.  Anemia panel:  Iron was 23, TIBC was 117, percent saturation was 20, vitamin B12 was greater than 2000, folate was 4.7, and ferritin was 715.  Urinalysis:  Positive for small amount leukocytes, negative for nitrites, negative for protein, blood, or ketones.  There were on microscopic analysis of urine 3-6 white blood cells high-powered field, 0-2 red blood cells per high-powered field, and rare bacteria.  HIV was negative.  UDS was negative.  HOSPITAL COURSE BY PROBLEMS: 1. Fever and cough:  Given [name] [name] symptoms, it was presumed she     had either bronchitis or possible pneumonia, though her chest x-ray     was clear.  She was started on ceftriaxone and azithromycin, and     blood cultures were taken, these are negative at the point of     discharge.  On day #2 of her hospitalization, [name] [name] was     switched to Levaquin 500 mg p.o. daily and continued for another 5     days of Levaquin therapy for a total 7 days of treatment.  By day     #2 of her hospitalization, Mr. Bunkley was no longer febrile.  She     remained afebrile for the remainder of her hospitalization.  She     endorsed feeling much better on the days leading up to discharge     and on the day of discharge. 2. Hypocalcemia:   [name] [name] calcium was supplemented both p.o. and     IV on the first 2 days of her hospitalization.  Following that, she     was supplemented with p.o. calcium which brought her back to a     therapeutic level, 3. Hypomagnesemia:  [name] [name] chronic hypomagnesemia continued to     be an issue during her hospitalization.  She required three doses     of IV magnesium.  She was also supplemented via oral magnesium.     This was increased from her home dose of 400 mg twice a day to 800     mg in the morning with breakfast, 400 mg at lunch, and 400 mg at     dinner upon the day of discharge: 4. Cachexia with a BMI of 17.9 and severe protein malnourishment:  In     order to determine more clearly the etiology of [name] [name]     malnourishment, nutrition consult was ordered and they diagnosed     her with severe malnourishment and particularly protein     malnourishment.  A workup for potential malabsorption was initiated     with celiac panel.  The results of this panel were negative for     IgA, reticulin antibody.  This was a preliminary result.  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  This was also made to be followed up as an     outpatient: 5. Right leg and back pain involving primarily the SI joint:  Ms.  [name] pain was managed using oxycodone and Percocet.  She was     initially started on gabapentin, however, during dose escalation     she began to feel slightly dizzy.  This was discontinued on the     fourth day of admission and she was continued only on Percocet     thereafter with adequate coverage of the pain. 6. Anemia:  [name] [name] anemia is likely not multifactorial     involving the components of protein malnourishment as well as iron     deficiency.  We continued  to supplement her meals as well as her     iron during her hospitalization, 7. Hypokalemia:  After initial dose of potassium IV as well as a small     number of oral supplementations of potassium, [name] [name]     potassium level became normal, that was normal on discharge from     the hospital.  DISCHARGE VITAL SIGNS:  Temperature 98.9, pulse 109, respirations 16, blood pressure 92/56, and O2 sat 95% on room air.  DISCHARGE LABORATORY DATA:  Acute hepatitis panel was negative.  As stated before, reticulin antibody for IgA was negative in the celiac panel.  Other results were pending.  Magnesium was slightly low at 1.3. Sodium was 135, potassium was 4.4, chloride was 98, bicarb was 28, BUN was 7, creatinine was 0.85, glucose was 95, and calcium was 9.5.  CBC: White count 7.9, hemoglobin 7.9, hematocrit 23.2, and platelets 236.    ______________________________ Quentin Ore, MD   ______________________________ Blanch Media, M.D.    MR/MEDQ  D:  12/26/2010  T:  12/27/2010  Job:  [id number]  cc:   Deatra Robinson, MD  Electronically Signed by Quentin Ore MD on 01/02/2011 10:12:18 PM Electronically Signed by Blanch Media M.D. on 01/05/2011 09:31:36 AM

## 2011-01-04 ENCOUNTER — Ambulatory Visit (INDEPENDENT_AMBULATORY_CARE_PROVIDER_SITE_OTHER): Payer: PRIVATE HEALTH INSURANCE | Admitting: Internal Medicine

## 2011-01-04 ENCOUNTER — Encounter: Payer: Self-pay | Admitting: Internal Medicine

## 2011-01-04 VITALS — BP 174/103 | HR 71 | Temp 98.1°F | Resp 20 | Ht 62.0 in | Wt 84.3 lb

## 2011-01-04 DIAGNOSIS — J40 Bronchitis, not specified as acute or chronic: Secondary | ICD-10-CM

## 2011-01-04 DIAGNOSIS — I10 Essential (primary) hypertension: Secondary | ICD-10-CM

## 2011-01-04 DIAGNOSIS — F1010 Alcohol abuse, uncomplicated: Secondary | ICD-10-CM

## 2011-01-04 DIAGNOSIS — R627 Adult failure to thrive: Secondary | ICD-10-CM

## 2011-01-04 DIAGNOSIS — R6251 Failure to thrive (child): Secondary | ICD-10-CM

## 2011-01-04 DIAGNOSIS — K859 Acute pancreatitis without necrosis or infection, unspecified: Secondary | ICD-10-CM

## 2011-01-04 DIAGNOSIS — M533 Sacrococcygeal disorders, not elsewhere classified: Secondary | ICD-10-CM

## 2011-01-04 LAB — BASIC METABOLIC PANEL
BUN: 16 mg/dL (ref 6–23)
Chloride: 107 mEq/L (ref 96–112)
Potassium: 4.3 mEq/L (ref 3.5–5.3)
Sodium: 139 mEq/L (ref 135–145)

## 2011-01-04 MED ORDER — DIGESTIVE ENZYMES PO TABS: ORAL_TABLET | ORAL | Status: DC

## 2011-01-04 MED ORDER — AMLODIPINE BESYLATE 10 MG PO TABS: 10.0000 mg | ORAL_TABLET | Freq: Every day | ORAL | Status: DC

## 2011-01-04 NOTE — Assessment & Plan Note (Signed)
I congratulated the patient on her significant progress towards alcohol cessation. - Urged the patient to continue her efforts towards complete cessation.

## 2011-01-04 NOTE — Assessment & Plan Note (Addendum)
Patient notes some weight gain, increased appetite, tolerance of Ensure and other foods.  - Will check repeat basic metabolic panel and magnesium level today, to assess electrolytes, with escalation of supplementation as needed. - Continue Ensure. - Continue electrolyte supplementation.

## 2011-01-04 NOTE — Assessment & Plan Note (Addendum)
Assessment: Disease Control:  Resolved, pt feeling well, negative pulmonary exam  Plan: Treatment:   No further thearpy indicated.

## 2011-01-04 NOTE — Assessment & Plan Note (Addendum)
Assessment: Disease Control:  not controlled Progress toward goals:  improved  Plan: Treatment:    We will add when necessary NSAID, renal function within normal limits. Recommend continued followup with sports medicine.

## 2011-01-04 NOTE — Patient Instructions (Addendum)
   Please follow-up at the clinic in 1 month, at which time we will reevaluate your blood pressure, eating.  If you have been started on new medication(s), and you develop throat closing, tongue swelling, rash, please stop the medication and call the clinic at [phone] and go to the ER.  You can take over the counter Ibuprofen or Aleve for your pain in the hip if needed.   If symptoms worsen, or new symptoms arise, please call the clinic or go to the ER.  STOP Hydrochlorothiazide.  Please bring all of your medications in a bag to your next visit.

## 2011-01-04 NOTE — Assessment & Plan Note (Signed)
Lab Results  Component Value Date   NA 135 12/26/2010   K 4.4 12/26/2010   CL 98 12/26/2010   CO2 28 12/26/2010   BUN 7 12/26/2010   CREATININE 0.85 12/26/2010   CREATININE 0.95 12/20/2010    BP Readings from Last 3 Encounters:  01/04/11 174/103  12/28/10 111/79  12/20/10 119/84    Assessment: Hypertension control:  moderately elevated  Progress toward goals:  deteriorated Barriers to meeting goals:   - Patient was held of her hydrochlorothiazide during hospital course, in the setting of mild hypotension and electrolyte disturbances. However, at this time the patient has significantly elevated blood pressure although she remains asymptomatic.  - Diuretic may not be the best option for this patient as she is known to have these electrolyte disturbances. - Therefore, we'll I will discontinue her hydrochlorothiazide at this time.  - Will start amlodipine, which may be beneficial for this patient as she is also African American.  Plan: Hypertension treatment:  DC HCTZ, Start Amlodipine.

## 2011-01-04 NOTE — Progress Notes (Signed)
Subjective:    [name], female    DOB: 10-01-1955, 55 y.o.   MRN: [id number]  HPI Pt is a 56 y.o. female with a  PMHx of B12 deficiency, alcohol abuse, failure to thrive, and electrolyte derangements and who presents to clinic today for the following:  1) Hospital follow-up: Patient was hospitalized at [facility] from 07/18-07/23/12 for evaluation of decreased appetite, fevers, right hip pain which was determined to be secondary to bronchitis versus CAP for which she was treated with Rocephin and Azithromycin, then changed to Levaquin for total of 7 days of antibiotics. She was also found to have severe protein-calorie malnutrition with associated electrolyte derangements, which were corrected during hospital course. 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 Since hospital discharge, patient notes significant improvement, has not had recurrence of the fevers, chills, no nausea, vomiting. Drinking Ensure 3 times a day. s/ is not taking recommended medications regularly. Eating and drinking better. Still drinking has decreased significantly to less than 1 beer/ day!   2) HTN - Patient does not check blood pressure regularly at home. Currently off of HCTZ which was held during hospital course due to soft BPs. Denies headaches, dizziness, lightheadedness, chest pain, shortness of breath.   3) Right sacroiliac joint pain - following with sports medicine now, had steroid injection of the sacroiliac joint, mild improvement, but still with some pain.     Review of Systems Per HPI.  Current Outpatient Medications Medication Sig  . acetaminophen (TYLENOL) 325 MG tablet Take 650 mg by mouth every 6 (six) hours as needed.    Marland Kitchen buPROPion (WELLBUTRIN) 100 MG tablet Take 1 tablet (100 mg total) by mouth 3 (three) times daily.  . calcium carbonate (TUMS - DOSED IN  MG ELEMENTAL CALCIUM) 500 MG chewable tablet Chew 1 tablet (200 mg of elemental calcium total) by mouth 3 (three) times daily.  . diclofenac sodium (VOLTAREN) 1 % GEL Apply 1 application topically 4 (four) times daily.  . ergocalciferol (VITAMIN D2) 50000 UNITS capsule Take 1 capsule (50,000 Units total) by mouth once a week.  . folic acid (FOLVITE) 1 MG tablet Take 1 tablet (1 mg total) by mouth daily.  . magnesium oxide (MAG-OX) 400 MG tablet Take 1 tablet (400 mg total) by mouth 2 (two) times daily.  . Nutritional Supplements (ENSURE PLUS HN) LIQD Take 237 mLs by mouth 3 (three) times daily with meals.    Marland Kitchen omeprazole (PRILOSEC) 40 MG capsule Take 1 capsule (40 mg total) by mouth daily.  Marland Kitchen oxyCODONE-acetaminophen (PERCOCET) 5-325 MG per tablet Take 1 tablet by mouth every 4 (four) hours as needed. 1-1.5 tabs q6h prn, do not take more than 3 grams of tylenol (9 regular strength pills) per day. One percocet counts as 1 dose of tylenol. Decrease tylenol dose by 1 for each percocet taken.   . sodium bicarbonate 650 MG tablet Take 650 mg by mouth 3 (three) times daily.    . Thiamine HCl (VITAMIN B-1) 100 MG tablet Take 1 tablet (100 mg total) by mouth daily.  . hydrochlorothiazide 25 MG tablet Take 1 tablet (25 mg total) by mouth daily.     Allergies Amitriptyline hcl and Doxycycline hyclate  Past Medical History  Diagnosis Date  . Anemia, B12 deficiency   . History of acute pancreatitis   . Right knee pain     No recent  imaging on chart  . 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 Dr. Su Hilt, [facility]  . Hypertriglyceridemia   . GERD (gastroesophageal reflux disease)   . Vitamin D deficiency   . Subdural hematoma 02/2008    Likely 2/2 trauma from seizure from EtOH withdrawal, chronic in nature, sees Dr. Robyne Askew. Most recent CT head 10/2009 showing stable but persistent hematoma without mass effect.  . History of seizure  disorder     Likely 2/2 alcohol abuse  . Hypocalcemia   . Hypomagnesemia   . Failure to thrive     Unclear etiology  . HTN (hypertension)   . Thrombocytopenia   . Anemia, macrocytic   . Hepatomegaly     On exam  . Alcohol abuse          Objective:   Physical Exam General: Vital signs reviewed and noted. Well-developed, cachectic-appearing, in no acute distress; alert, appropriate and cooperative throughout examination.  Head: Normocephalic, atraumatic.  Neck: No deformities, masses, or tenderness noted.  Lungs:  Normal respiratory effort. Clear to auscultation BL without crackles or wheezes.  Heart: RRR. S1 and S2 normal without gallop, murmur, or rubs.  Abdomen:  BS normoactive. Soft, Nondistended, non-tender.  No masses or organomegaly.  Extremities: No pretibial edema. Tenderness to palpation of her sacroiliac joint, right.        Assessment & Plan:  Case and plan of care discussed with Dr. Blanch Media.

## 2011-01-04 NOTE — Assessment & Plan Note (Signed)
Will add pancreatic enzymes in the setting of possible chronic pancreatitis.

## 2011-01-05 ENCOUNTER — Encounter: Payer: PRIVATE HEALTH INSURANCE | Admitting: Internal Medicine

## 2011-01-10 ENCOUNTER — Other Ambulatory Visit: Payer: Self-pay | Admitting: Internal Medicine

## 2011-01-10 MED ORDER — MAGNESIUM OXIDE 400 MG PO TABS: 800.0000 mg | ORAL_TABLET | Freq: Two times a day (BID) | ORAL | Status: DC

## 2011-01-10 NOTE — Progress Notes (Signed)
Magnesium level is still low although other electrolyte derangements have corrected. Therefore, pt was called and we have decided to double her dose of the magnesium. She is instructed that if she gets resultant diarrhea and is unable to keep up the fluid loss from the stools so she is feeling dehydrated and such, that she should again reduce her magnesium dosage. She understands and is agreeable to the plan.  Johnette Abraham, D.O.

## 2011-01-10 NOTE — Progress Notes (Signed)
Addended by: Priscella Mann on: 01/10/2011 12:05 PM   Modules accepted: Orders

## 2011-01-23 ENCOUNTER — Encounter: Payer: PRIVATE HEALTH INSURANCE | Admitting: Internal Medicine

## 2011-01-31 ENCOUNTER — Other Ambulatory Visit: Payer: Self-pay | Admitting: *Deleted

## 2011-01-31 DIAGNOSIS — M1990 Unspecified osteoarthritis, unspecified site: Secondary | ICD-10-CM

## 2011-01-31 MED ORDER — OXYCODONE-ACETAMINOPHEN 5-325 MG PO TABS: 1.0000 | ORAL_TABLET | ORAL | Status: DC | PRN

## 2011-01-31 NOTE — Telephone Encounter (Signed)
Call pt when ready at [phone]

## 2011-02-01 NOTE — Telephone Encounter (Addendum)
Dear Inocencio Homes, The instructions were initially entered by Dr. Marcelino Scot.Thank you for pointing out the discrepancy.  I corrected  Instructions. Thank you. Deatra Robinson

## 2011-02-07 MED ORDER — OXYCODONE-ACETAMINOPHEN 5-325 MG PO TABS: 1.0000 | ORAL_TABLET | Freq: Four times a day (QID) | ORAL | Status: DC | PRN

## 2011-02-20 ENCOUNTER — Ambulatory Visit (INDEPENDENT_AMBULATORY_CARE_PROVIDER_SITE_OTHER): Payer: PRIVATE HEALTH INSURANCE | Admitting: *Deleted

## 2011-02-20 DIAGNOSIS — E538 Deficiency of other specified B group vitamins: Secondary | ICD-10-CM

## 2011-02-20 MED ORDER — CYANOCOBALAMIN 1000 MCG/ML IJ SOLN
1000.0000 ug | Freq: Once | INTRAMUSCULAR | Status: AC
  Administered 2011-02-20: 1000 ug via INTRAMUSCULAR

## 2011-02-22 ENCOUNTER — Other Ambulatory Visit: Payer: Self-pay | Admitting: Internal Medicine

## 2011-02-22 DIAGNOSIS — Z1231 Encounter for screening mammogram for malignant neoplasm of breast: Secondary | ICD-10-CM

## 2011-03-02 LAB — BASIC METABOLIC PANEL
BUN: 1 — ABNORMAL LOW
BUN: 2 — ABNORMAL LOW
BUN: 2 — ABNORMAL LOW
BUN: 2 — ABNORMAL LOW
BUN: 2 — ABNORMAL LOW
BUN: 3 — ABNORMAL LOW
BUN: 3 — ABNORMAL LOW
BUN: 3 — ABNORMAL LOW
BUN: 4 — ABNORMAL LOW
BUN: 5 — ABNORMAL LOW
BUN: 12
CO2: 15 — ABNORMAL LOW
CO2: 15 — ABNORMAL LOW
CO2: 16 — ABNORMAL LOW
CO2: 16 — ABNORMAL LOW
CO2: 17 — ABNORMAL LOW
CO2: 19
CO2: 19
CO2: 20
CO2: 20
CO2: 20
CO2: 21
CO2: 22
CO2: 19
Calcium: 8 — ABNORMAL LOW
Calcium: 8.1 — ABNORMAL LOW
Calcium: 8.5
Chloride: 107
Chloride: 108
Chloride: 108
Chloride: 110
Chloride: 110
Chloride: 110
Chloride: 110
Chloride: 112
Chloride: 111
Creatinine, Ser: 0.73
Creatinine, Ser: 0.77
Creatinine, Ser: 0.82
Creatinine, Ser: 0.82
Creatinine, Ser: 0.84
Creatinine, Ser: 0.86
Creatinine, Ser: 1.16
GFR calc Af Amer: >60
GFR calc Af Amer: >60
GFR calc Af Amer: >60
GFR calc non Af Amer: 57 — ABNORMAL LOW
GFR calc non Af Amer: >60
GFR calc non Af Amer: >60
GFR calc non Af Amer: >60
GFR calc non Af Amer: >60
Glucose, Bld: 1034
Glucose, Bld: 106 — ABNORMAL HIGH
Glucose, Bld: 108 — ABNORMAL HIGH
Glucose, Bld: 132 — ABNORMAL HIGH
Glucose, Bld: 134 — ABNORMAL HIGH
Glucose, Bld: 148 — ABNORMAL HIGH
Glucose, Bld: 84
Glucose, Bld: 99
Glucose, Bld: 99
Potassium: 2.9 — ABNORMAL LOW
Potassium: 3.3 — ABNORMAL LOW
Potassium: 3.7
Potassium: 3.8
Potassium: 3.9
Potassium: 3.9
Potassium: 4
Potassium: 4.4
Sodium: 131 — ABNORMAL LOW
Sodium: 135

## 2011-03-02 LAB — POCT I-STAT, CHEM 8
BUN: 7
BUN: 7
Calcium, Ion: 0.58 — ABNORMAL LOW
Chloride: 100
Creatinine, Ser: 1.2
Creatinine, Ser: 1.4 — ABNORMAL HIGH
Glucose, Bld: 108 — ABNORMAL HIGH
Glucose, Bld: 126 — ABNORMAL HIGH
Hemoglobin: 9.9 — ABNORMAL LOW
Potassium: 3.2 — ABNORMAL LOW
TCO2: 17
TCO2: 20

## 2011-03-02 LAB — CBC
HCT: 26.5 — ABNORMAL LOW
HCT: 27.5 — ABNORMAL LOW
HCT: 30 — ABNORMAL LOW
HCT: 31.1 — ABNORMAL LOW
HCT: 32.9 — ABNORMAL LOW
HCT: 24.8 — ABNORMAL LOW
Hemoglobin: 10.3 — ABNORMAL LOW
Hemoglobin: 11.1 — ABNORMAL LOW
Hemoglobin: 11.6 — ABNORMAL LOW
Hemoglobin: 9.5 — ABNORMAL LOW
MCHC: 34.6
MCHC: 34.8
MCHC: 35.2
MCHC: 35.6
MCHC: 34.3
MCV: 100.8 — ABNORMAL HIGH
MCV: 101.2 — ABNORMAL HIGH
MCV: 102.2 — ABNORMAL HIGH
MCV: 102.2 — ABNORMAL HIGH
MCV: 102.5 — ABNORMAL HIGH
MCV: 102.9 — ABNORMAL HIGH
MCV: 102.5 — ABNORMAL HIGH
MCV: 103.7 — ABNORMAL HIGH
Platelets: 106 — ABNORMAL LOW
Platelets: 123 — ABNORMAL LOW
Platelets: 160
Platelets: 191
Platelets: 235
Platelets: 274
Platelets: 359
Platelets: 376
RBC: 2.59 — ABNORMAL LOW
RBC: 2.69 — ABNORMAL LOW
RBC: 2.96 — ABNORMAL LOW
RBC: 3.09 — ABNORMAL LOW
RBC: 3.24 — ABNORMAL LOW
RBC: 2.23 — ABNORMAL LOW
RDW: 12.9
RDW: 12.9
RDW: 13.1
RDW: 13.4
RDW: 13.5
RDW: 13.7
WBC: 5
WBC: 5.9
WBC: 6.3
WBC: 7.2
WBC: 10.8 — ABNORMAL HIGH

## 2011-03-02 LAB — MAGNESIUM
Magnesium: 0.6 — CL
Magnesium: 1.1 — ABNORMAL LOW
Magnesium: 1.1 — ABNORMAL LOW
Magnesium: 1.5
Magnesium: 1.6
Magnesium: 1.6
Magnesium: 1.7
Magnesium: 1.8
Magnesium: 1.5

## 2011-03-02 LAB — DIFFERENTIAL
Basophils Absolute: 0
Basophils Absolute: 0.1
Basophils Relative: 1
Basophils Relative: 1
Basophils Relative: 1
Eosinophils Absolute: 0.1
Eosinophils Relative: 2
Lymphocytes Relative: 19
Lymphocytes Relative: 35
Lymphocytes Relative: 18
Lymphs Abs: 1.6
Lymphs Abs: 2
Monocytes Absolute: 1
Monocytes Absolute: 1.9 — ABNORMAL HIGH
Monocytes Relative: 11
Monocytes Relative: 9
Monocytes Relative: 17 — ABNORMAL HIGH
Neutro Abs: 4.5
Neutro Abs: 4.9
Neutro Abs: 6
Neutro Abs: 6.6
Neutrophils Relative %: 71
Neutrophils Relative %: 61

## 2011-03-02 LAB — URINALYSIS, ROUTINE W REFLEX MICROSCOPIC
Bilirubin Urine: NEGATIVE
Glucose, UA: NEGATIVE
Hgb urine dipstick: NEGATIVE
Hgb urine dipstick: NEGATIVE
Hgb urine dipstick: NEGATIVE
Ketones, ur: NEGATIVE
Ketones, ur: NEGATIVE
Nitrite: NEGATIVE
Protein, ur: 100 — AB
Specific Gravity, Urine: 1.018
Urobilinogen, UA: 0.2
Urobilinogen, UA: 0.2
pH: 5.5
pH: 7

## 2011-03-02 LAB — SODIUM, URINE, RANDOM: Sodium, Ur: 164

## 2011-03-02 LAB — URINE CULTURE

## 2011-03-02 LAB — COMPREHENSIVE METABOLIC PANEL
ALT: 12
AST: 32
Albumin: 3.1 — ABNORMAL LOW
Alkaline Phosphatase: 74
BUN: 6
BUN: 7
BUN: 15
CO2: 18 — ABNORMAL LOW
CO2: 19
Calcium: 4.8 — CL
Calcium: 9.4
Chloride: 107
Chloride: 108
Creatinine, Ser: 1.1
Creatinine, Ser: 1.2
Creatinine, Ser: 1.59 — ABNORMAL HIGH
GFR calc Af Amer: 42 — ABNORMAL LOW
GFR calc non Af Amer: 48 — ABNORMAL LOW
GFR calc non Af Amer: 53 — ABNORMAL LOW
Glucose, Bld: 108 — ABNORMAL HIGH
Glucose, Bld: 124 — ABNORMAL HIGH
Sodium: 134 — ABNORMAL LOW
Total Bilirubin: 1
Total Protein: 7.7
Total Protein: 7.6

## 2011-03-02 LAB — CK TOTAL AND CKMB (NOT AT ARMC)
CK, MB: 1.8
Total CK: 190 — ABNORMAL HIGH

## 2011-03-02 LAB — PTH, INTACT AND CALCIUM
Calcium, Total (PTH): 5.8 — CL
PTH: 214.4 — ABNORMAL HIGH

## 2011-03-02 LAB — POCT CARDIAC MARKERS
CKMB, poc: 2.6
Myoglobin, poc: 407
Myoglobin, poc: 309
Operator id: 133351
Troponin i, poc: <0.05

## 2011-03-02 LAB — CREATININE, URINE, RANDOM: Creatinine, Urine: 40.2

## 2011-03-02 LAB — RETICULOCYTES
RBC.: 2.2 — ABNORMAL LOW
Retic Count, Absolute: 50.6

## 2011-03-02 LAB — CARDIAC PANEL(CRET KIN+CKTOT+MB+TROPI)
CK, MB: 2
Relative Index: 0.6
Relative Index: 0.7
Total CK: 286 — ABNORMAL HIGH
Troponin I: <0.01
Troponin I: <0.01
Troponin I: 0.01

## 2011-03-02 LAB — ACETAMINOPHEN LEVEL: Acetaminophen (Tylenol), Serum: <10 — ABNORMAL LOW

## 2011-03-02 LAB — CALCIUM, URINE, RANDOM: Calcium, Ur: 2

## 2011-03-02 LAB — VITAMIN D 25 HYDROXY (VIT D DEFICIENCY, FRACTURES): Vit D, 25-Hydroxy: 5 — ABNORMAL LOW (ref 30–89)

## 2011-03-02 LAB — FOLATE RBC
RBC Folate: 382
RBC Folate: 1009 — ABNORMAL HIGH

## 2011-03-02 LAB — IRON AND TIBC
Iron: 17 — ABNORMAL LOW
TIBC: 116 — ABNORMAL LOW
UIBC: 99

## 2011-03-02 LAB — PHOSPHORUS
Phosphorus: 3.4
Phosphorus: 3.6
Phosphorus: 4.4

## 2011-03-02 LAB — B-NATRIURETIC PEPTIDE (CONVERTED LAB): Pro B Natriuretic peptide (BNP): 37

## 2011-03-02 LAB — URINE MICROSCOPIC-ADD ON

## 2011-03-02 LAB — H1N1 SCREEN (PCR): H1N1 Virus Scrn: NOT DETECTED

## 2011-03-02 LAB — RAPID URINE DRUG SCREEN, HOSP PERFORMED
Amphetamines: NOT DETECTED
Barbiturates: NOT DETECTED
Cocaine: POSITIVE — AB
Opiates: NOT DETECTED
Tetrahydrocannabinol: NOT DETECTED

## 2011-03-02 LAB — TSH: TSH: 0.79 (ref 0.350–4.500)

## 2011-03-02 LAB — CROSSMATCH: Antibody Screen: NEGATIVE

## 2011-03-02 LAB — HIV ANTIBODY (ROUTINE TESTING W REFLEX): HIV: NONREACTIVE

## 2011-03-02 LAB — PROTIME-INR
INR: 1.1
Prothrombin Time: 14.8

## 2011-03-02 LAB — VITAMIN B12: Vitamin B-12: 1006 — ABNORMAL HIGH (ref 211–911)

## 2011-03-02 LAB — AMMONIA: Ammonia: 20

## 2011-03-02 LAB — ABO/RH: ABO/RH(D): A POS

## 2011-03-02 LAB — FERRITIN: Ferritin: 613 — ABNORMAL HIGH (ref 10–291)

## 2011-03-02 LAB — ANTI-NUCLEAR AB-TITER (ANA TITER): ANA Titer 1: 1:80 {titer} — ABNORMAL HIGH

## 2011-03-02 LAB — OSMOLALITY: Osmolality: 274 — ABNORMAL LOW

## 2011-03-02 LAB — APTT: aPTT: 28

## 2011-03-02 LAB — ANA: Anti Nuclear Antibody(ANA): POSITIVE — AB

## 2011-03-02 LAB — T4, FREE: Free T4: 0.68 — ABNORMAL LOW

## 2011-03-08 LAB — DIFFERENTIAL
Basophils Absolute: 0.1
Basophils Relative: 2 — ABNORMAL HIGH
Eosinophils Absolute: 0.2
Eosinophils Relative: 3
Lymphocytes Relative: 27

## 2011-03-08 LAB — CBC
HCT: 27.2 — ABNORMAL LOW
Platelets: 350
RDW: 13.7

## 2011-03-08 LAB — TYPE AND SCREEN
ABO/RH(D): A POS
Antibody Screen: NEGATIVE

## 2011-03-08 LAB — POCT I-STAT, CHEM 8
BUN: 19
HCT: 29 — ABNORMAL LOW
Hemoglobin: 9.9 — ABNORMAL LOW
Sodium: 134 — ABNORMAL LOW
TCO2: 13

## 2011-03-09 ENCOUNTER — Ambulatory Visit (INDEPENDENT_AMBULATORY_CARE_PROVIDER_SITE_OTHER): Payer: PRIVATE HEALTH INSURANCE | Admitting: Internal Medicine

## 2011-03-09 ENCOUNTER — Encounter: Payer: Self-pay | Admitting: Internal Medicine

## 2011-03-09 VITALS — BP 123/90 | HR 90 | Temp 97.8°F | Ht 62.0 in | Wt 82.7 lb

## 2011-03-09 DIAGNOSIS — F1020 Alcohol dependence, uncomplicated: Secondary | ICD-10-CM

## 2011-03-09 DIAGNOSIS — M545 Low back pain: Secondary | ICD-10-CM

## 2011-03-09 DIAGNOSIS — E46 Unspecified protein-calorie malnutrition: Secondary | ICD-10-CM

## 2011-03-09 LAB — BASIC METABOLIC PANEL
BUN: 7 mg/dL (ref 6–23)
Calcium: 5.9 mg/dL — CL (ref 8.4–10.5)
Creat: 1.02 mg/dL (ref 0.50–1.10)
Glucose, Bld: 89 mg/dL (ref 70–99)

## 2011-03-09 LAB — PHOSPHORUS: Phosphorus: 2.6 mg/dL (ref 2.3–4.6)

## 2011-03-09 MED ORDER — HYDROCODONE-ACETAMINOPHEN 5-500 MG PO TABS: 1.0000 | ORAL_TABLET | Freq: Three times a day (TID) | ORAL | Status: DC | PRN

## 2011-03-09 MED ORDER — FOLIC ACID 1 MG PO TABS: 1.0000 mg | ORAL_TABLET | Freq: Every day | ORAL | Status: DC

## 2011-03-09 MED ORDER — VITAMIN B-1 100 MG PO TABS: 100.0000 mg | ORAL_TABLET | Freq: Every day | ORAL | Status: DC

## 2011-03-09 MED ORDER — ERGOCALCIFEROL 1.25 MG (50000 UT) PO CAPS: 50000.0000 [IU] | ORAL_CAPSULE | ORAL | Status: DC

## 2011-03-09 NOTE — Patient Instructions (Signed)
Please, do not drive and/or operate machinery if feel sedated while taking Vicodin. Please, follow up with Sports medicine clinic as was instructed. You received flu shot and tetanus shots today. Please, follow up with a colonoscopy screening as was discussed.

## 2011-03-10 ENCOUNTER — Telehealth: Payer: Self-pay | Admitting: *Deleted

## 2011-03-10 ENCOUNTER — Telehealth: Payer: Self-pay | Admitting: Internal Medicine

## 2011-03-10 NOTE — Telephone Encounter (Signed)
Received a call from lab at 12:21 am 03/10/2011 regarding critical electrolyte depletion. Mg 0.8 Ca 5.9 CO2 16 K 2.9  Patient has chronic electrolyte depletion secondary to hx of alcohol abuse.

## 2011-03-10 NOTE — Telephone Encounter (Signed)
That seems reasonable will forward on to [place] who saw her last and I will discuss it with her.

## 2011-03-10 NOTE — Progress Notes (Signed)
  Subjective:    [name], female    DOB: Jan 21, 1956, 55 y.o.   MRN: [id number]  HPI  Follow up appointment on R LBP; denies any pain relief with tylenol or OTC. Taken of tramadol due to ETOH use and risk for seizures. Patient underwent  Right SI joint steroid injection --not due for  Several months. Pain is described as 7/10 in intensity and worse with ROM and/or standing. Pain is constant and patient requests "something stronger."  Review of Systems  Constitutional: Positive for activity change. Negative for fever, chills, diaphoresis, appetite change and fatigue.  Eyes: Negative for visual disturbance.  Respiratory: Negative.   Cardiovascular: Negative.   Gastrointestinal: Negative.   Musculoskeletal: Positive for back pain. Negative for joint swelling, arthralgias and gait problem.  Neurological: Negative for dizziness, seizures, syncope, weakness, numbness and headaches.  Hematological: Negative for adenopathy. Does not bruise/bleed easily.  Psychiatric/Behavioral: Negative for hallucinations, behavioral problems, confusion, dysphoric mood, decreased concentration and agitation.       Objective:   Physical Exam  Constitutional: No distress.  HENT:  Head: Atraumatic.  Right Ear: External ear normal.  Left Ear: External ear normal.  Eyes: Conjunctivae and EOM are normal. Pupils are equal, round, and reactive to light.  Neck: Normal range of motion. Neck supple. No JVD present. No thyromegaly present.  Cardiovascular: Normal rate, regular rhythm, normal heart sounds and intact distal pulses.   Pulmonary/Chest: Effort normal and breath sounds normal.  Abdominal: Soft. Bowel sounds are normal. She exhibits no mass. There is no tenderness. There is no rebound and no guarding.  Musculoskeletal: She exhibits tenderness. She exhibits no edema.       Right shoulder: She exhibits decreased range of motion.       Lumbar back: She exhibits decreased range of motion and  tenderness. She exhibits no bony tenderness, no swelling, no edema, no deformity, no laceration, no pain, no spasm and normal pulse.       Arms:      Feet:  Lymphadenopathy:    She has no cervical adenopathy.  Skin: She is not diaphoretic.          Assessment & Plan:  1. 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 -discussed with Dr. Phillips Odor. -start Vicodin --side-effects, pros-cons addressed; all questions answered in full. -contract signed. -follow up with sports medicine 2. Hx of severe electrolyte derangements due to ETOH abuse and malnourishment. -C-met, Mg, Phos levels.

## 2011-03-10 NOTE — Telephone Encounter (Signed)
Called patient's home phone number --no response and no voice mail.  Called home work mumber -- no response. Called mobile phone number ->left the message for the patient to contact our clinic ASAP in r/t her lab results.

## 2011-03-10 NOTE — Telephone Encounter (Signed)
Pt states she needs oxycodone, states she took 2 hydrocodone last pm and they made her vomit, states the oxycodone won't make her vomit. She is informed she will have to bring the hydrocodone to clinic for disposal if md agrees to do this but md may not be willing to do so. Please advise

## 2011-03-13 ENCOUNTER — Encounter: Payer: Self-pay | Admitting: Internal Medicine

## 2011-03-13 ENCOUNTER — Ambulatory Visit (HOSPITAL_COMMUNITY)
Admission: RE | Admit: 2011-03-13 | Discharge: 2011-03-13 | Disposition: A | Discharge status: Home / Self Care | Payer: PRIVATE HEALTH INSURANCE | Source: Ambulatory Visit | Attending: Internal Medicine | Admitting: Internal Medicine

## 2011-03-13 ENCOUNTER — Ambulatory Visit (INDEPENDENT_AMBULATORY_CARE_PROVIDER_SITE_OTHER): Payer: PRIVATE HEALTH INSURANCE | Admitting: Internal Medicine

## 2011-03-13 ENCOUNTER — Telehealth: Payer: Self-pay | Admitting: *Deleted

## 2011-03-13 VITALS — BP 112/90 | HR 85 | Temp 96.6°F | Ht 61.0 in | Wt 84.7 lb

## 2011-03-13 DIAGNOSIS — E876 Hypokalemia: Secondary | ICD-10-CM

## 2011-03-13 DIAGNOSIS — I252 Old myocardial infarction: Secondary | ICD-10-CM | POA: Insufficient documentation

## 2011-03-13 DIAGNOSIS — M1990 Unspecified osteoarthritis, unspecified site: Secondary | ICD-10-CM

## 2011-03-13 MED ORDER — OXYCODONE-ACETAMINOPHEN 5-325 MG PO TABS: 1.0000 | ORAL_TABLET | Freq: Four times a day (QID) | ORAL | Status: DC | PRN

## 2011-03-13 MED ORDER — POTASSIUM CHLORIDE CRYS ER 20 MEQ PO TBCR: 20.0000 meq | EXTENDED_RELEASE_TABLET | Freq: Two times a day (BID) | ORAL | Status: DC

## 2011-03-13 MED ORDER — POTASSIUM CHLORIDE CRYS ER 10 MEQ PO TBCR: 40.0000 meq | EXTENDED_RELEASE_TABLET | Freq: Once | ORAL | Status: DC

## 2011-03-13 NOTE — Progress Notes (Signed)
2 tablet Potassium Chloride given by mouth 3:15PM per Dr Denton Meek ( K each tab ). Stanton Kidney Carley Strickling RN 03/13/11 3:50PM

## 2011-03-13 NOTE — Patient Instructions (Signed)
Please, TAKE ALL YOUR MEDICATIONS AS PRESCRIBED!!!! Please, do not drive and/or operate machinery while taking Oxycodone (Percocet). Please, follow up in 2-4 weeks and call with any questions

## 2011-03-13 NOTE — Telephone Encounter (Signed)
Pt is here today with her bottle of Vicodin that she received at her last Clinic visit. Pt said that the Vicodin does not work and she would like to get her Oxycodone-because it works better.

## 2011-03-13 NOTE — Telephone Encounter (Signed)
Please, have the patient come in to see me today if possible. Thank you.

## 2011-03-13 NOTE — Progress Notes (Signed)
  Subjective:    [name], female    DOB: 30-Apr-1956, 55 y.o.   MRN: [id number]  HPI  Follow up appointment- review of her lab results. Would like to change pain management therapy --reports emesis with vicodin.  Review of Systems  Constitutional: Positive for fatigue. Negative for activity change and appetite change.  HENT: Negative for tinnitus.   Eyes: Negative for visual disturbance.  Respiratory: Negative for choking, chest tightness, shortness of breath and wheezing.   Cardiovascular: Negative for chest pain and palpitations.  Gastrointestinal: Negative for abdominal distention.  Musculoskeletal: Positive for back pain. Negative for joint swelling, arthralgias and gait problem.  Neurological: Negative for dizziness, tremors, seizures, syncope, speech difficulty, weakness, light-headedness and headaches.  Hematological: Negative for adenopathy. Does not bruise/bleed easily.  Psychiatric/Behavioral: Negative for hallucinations, confusion and decreased concentration.       Objective:   Physical Exam Vitals: T= , BP=, HR=, RR=, O2 Sat= on RA. General: alert, well-developed, and cooperative to examination.  Head: normocephalic and atraumatic.  Eyes: vision grossly intact, pupils equal, pupils round, pupils reactive to light, no injection and anicteric.  Mouth: pharynx pink and moist, no erythema, and no exudates.  Neck: supple, full ROM, no thyromegaly, no JVD, and no carotid bruits.  Lungs: normal respiratory effort, no accessory muscle use, normal breath sounds, no crackles, and no wheezes. Heart: normal rate, regular rhythm, no murmur, no gallop, and no rub.  Abdomen: soft, non-tender, normal bowel sounds, no distention, no guarding, no rebound tenderness, no hepatomegaly, and no splenomegaly.  Msk: no joint swelling, no joint warmth, and no redness over joints.  Pulses: 2+ DP/PT pulses bilaterally Extremities: No cyanosis, clubbing, edema Neurologic: alert  & oriented X3, cranial nerves II-XII intact, strength normal in all extremities, sensation intact to light touch, and gait normal.  Skin: turgor normal and no rashes.  Psych: Oriented X3, memory intact for recent and remote, normally interactive, good eye contact, not anxious appearing, and not depressed appearing.          Assessment & Plan:  1. Multiple electrolyte derrangements due to alcoholism and malnutrition. -Patient admits to being non-compliant with Mg Ox and MVI. -Patient refuses to be admitted to hospital, go to Short stay or ED for Mg and KCL infusions. -consequence of her decision explained to the patient (including death) ->patient verbalized udnerstanding. -EKG to evaluate for dysrythmia in a setting of hypokalemia. -Kdur 40 meq PO daily (1st dose given in the office) -Restart Mx Ox 800 mg PO BID -will recheck levels in 2 weeks. -instructed to go to EE or call 911 if develops ANY symptoms.  2.Chronic LBP with right sciatica. - -D/C vicodin due to associated emesis -Percocet 5/325 mg PO q 6 hrs PRN #90; no RF (Pain contract updated).

## 2011-03-17 ENCOUNTER — Other Ambulatory Visit: Payer: PRIVATE HEALTH INSURANCE

## 2011-03-17 ENCOUNTER — Other Ambulatory Visit: Payer: Self-pay | Admitting: Internal Medicine

## 2011-03-17 DIAGNOSIS — I10 Essential (primary) hypertension: Secondary | ICD-10-CM

## 2011-03-18 LAB — BASIC METABOLIC PANEL WITH GFR
BUN: 7 mg/dL (ref 6–23)
Calcium: 6.1 mg/dL — CL (ref 8.4–10.5)
Chloride: 105 mEq/L (ref 96–112)
Glucose, Bld: 154 mg/dL — ABNORMAL HIGH (ref 70–99)
Potassium: 3.1 mEq/L — ABNORMAL LOW (ref 3.5–5.3)

## 2011-03-18 LAB — MAGNESIUM: Magnesium: 0.8 mg/dL — ABNORMAL LOW (ref 1.5–2.5)

## 2011-03-20 NOTE — Progress Notes (Signed)
Patient is chronically malnourished and has a history of lack of adherence with her treatment regimen. Patient refuses to be admitted to hospital for an aggressive electrolytes repletion. Risks of electrolytes derangements thoroughly discussed with the patient (including death). Patient verbalized understanding. She states that "doc, I will restart my medications now, I promises." she agreed to to recheck her electrolytes in 1 week.

## 2011-03-23 ENCOUNTER — Other Ambulatory Visit: Payer: Self-pay | Admitting: Internal Medicine

## 2011-03-28 ENCOUNTER — Ambulatory Visit
Admission: RE | Admit: 2011-03-28 | Discharge: 2011-03-28 | Disposition: A | Discharge status: Home / Self Care | Payer: PRIVATE HEALTH INSURANCE | Source: Ambulatory Visit | Attending: Internal Medicine | Admitting: Internal Medicine

## 2011-03-28 DIAGNOSIS — Z1231 Encounter for screening mammogram for malignant neoplasm of breast: Secondary | ICD-10-CM

## 2011-04-03 ENCOUNTER — Encounter: Payer: Self-pay | Admitting: Internal Medicine

## 2011-04-03 ENCOUNTER — Telehealth: Payer: Self-pay | Admitting: Internal Medicine

## 2011-04-03 ENCOUNTER — Ambulatory Visit (INDEPENDENT_AMBULATORY_CARE_PROVIDER_SITE_OTHER): Payer: PRIVATE HEALTH INSURANCE | Admitting: Internal Medicine

## 2011-04-03 DIAGNOSIS — R110 Nausea: Secondary | ICD-10-CM

## 2011-04-03 DIAGNOSIS — R209 Unspecified disturbances of skin sensation: Secondary | ICD-10-CM

## 2011-04-03 DIAGNOSIS — I10 Essential (primary) hypertension: Secondary | ICD-10-CM

## 2011-04-03 DIAGNOSIS — E538 Deficiency of other specified B group vitamins: Secondary | ICD-10-CM

## 2011-04-03 DIAGNOSIS — R202 Paresthesia of skin: Secondary | ICD-10-CM

## 2011-04-03 DIAGNOSIS — E878 Other disorders of electrolyte and fluid balance, not elsewhere classified: Secondary | ICD-10-CM

## 2011-04-03 LAB — COMPREHENSIVE METABOLIC PANEL
Albumin: 3.2 g/dL — ABNORMAL LOW (ref 3.5–5.2)
BUN: 5 mg/dL — ABNORMAL LOW (ref 6–23)
Chloride: 106 mEq/L (ref 96–112)
Glucose, Bld: 127 mg/dL — ABNORMAL HIGH (ref 70–99)
Potassium: 2.7 mEq/L — CL (ref 3.5–5.3)

## 2011-04-03 LAB — CBC
MCH: 35.4 pg — ABNORMAL HIGH (ref 26.0–34.0)
Platelets: 168 10*3/uL (ref 150–400)
RBC: 2.97 MIL/uL — ABNORMAL LOW (ref 3.87–5.11)
WBC: 5.2 10*3/uL (ref 4.0–10.5)

## 2011-04-03 LAB — VITAMIN B12: Vitamin B-12: 1423 pg/mL — ABNORMAL HIGH (ref 211–911)

## 2011-04-03 LAB — TSH: TSH: 0.506 u[IU]/mL (ref 0.350–4.500)

## 2011-04-03 LAB — MAGNESIUM: Magnesium: 0.7 mg/dL — ABNORMAL LOW (ref 1.5–2.5)

## 2011-04-03 MED ORDER — AMLODIPINE BESYLATE 2.5 MG PO TABS: 2.5000 mg | ORAL_TABLET | Freq: Every day | ORAL | Status: DC

## 2011-04-03 MED ORDER — PROMETHAZINE HCL 12.5 MG PO TABS: 12.5000 mg | ORAL_TABLET | Freq: Four times a day (QID) | ORAL | Status: DC | PRN

## 2011-04-03 NOTE — Telephone Encounter (Signed)
[facility] called to report critical lab values of K 2.7, Ca < 5.0, and Mag 0.7.  Per EMR pt seen earlier in clinic and declined admission for tx of electrolyte disturbances despite risk of death.  Will attempt to call pt again and persuade her to come to the ER for tx.

## 2011-04-03 NOTE — Telephone Encounter (Signed)
Attempted to contact pt at home number as well as on cell number.  No answer on home phone and no answering machine to leave message.  Message left on cell phone voice mail for pt to contact the on-call physician as soon as possible to discuss critical lab results, included hospital number and instructions on how to reach the on-call physician.  Will attempt to contact pt again.

## 2011-04-03 NOTE — Patient Instructions (Signed)
Please, note the change in amlodipine dose ->take 2.5 mg one tablet daily. Our clinic will contact you with your blood test results. If you start feeling worse -.please, go to ED or call 911. Please, follow up with [place] in 2-3 days.

## 2011-04-04 ENCOUNTER — Encounter: Payer: Self-pay | Admitting: Internal Medicine

## 2011-04-04 ENCOUNTER — Inpatient Hospital Stay (HOSPITAL_COMMUNITY)
Admission: AD | Admit: 2011-04-04 | Discharge: 2011-04-07 | DRG: 641 | Disposition: A | Discharge status: Home Health | Payer: PRIVATE HEALTH INSURANCE | Source: Ambulatory Visit | Attending: Infectious Diseases | Admitting: Infectious Diseases

## 2011-04-04 DIAGNOSIS — I10 Essential (primary) hypertension: Secondary | ICD-10-CM | POA: Diagnosis present

## 2011-04-04 DIAGNOSIS — E876 Hypokalemia: Secondary | ICD-10-CM | POA: Diagnosis present

## 2011-04-04 DIAGNOSIS — G40909 Epilepsy, unspecified, not intractable, without status epilepticus: Secondary | ICD-10-CM | POA: Diagnosis present

## 2011-04-04 DIAGNOSIS — E46 Unspecified protein-calorie malnutrition: Secondary | ICD-10-CM | POA: Diagnosis present

## 2011-04-04 DIAGNOSIS — F1020 Alcohol dependence, uncomplicated: Secondary | ICD-10-CM | POA: Diagnosis present

## 2011-04-04 DIAGNOSIS — K861 Other chronic pancreatitis: Secondary | ICD-10-CM | POA: Diagnosis present

## 2011-04-04 DIAGNOSIS — Z79899 Other long term (current) drug therapy: Secondary | ICD-10-CM

## 2011-04-04 DIAGNOSIS — K219 Gastro-esophageal reflux disease without esophagitis: Secondary | ICD-10-CM | POA: Diagnosis present

## 2011-04-04 DIAGNOSIS — E781 Pure hyperglyceridemia: Secondary | ICD-10-CM | POA: Diagnosis present

## 2011-04-04 DIAGNOSIS — Z87891 Personal history of nicotine dependence: Secondary | ICD-10-CM

## 2011-04-04 LAB — COMPREHENSIVE METABOLIC PANEL
ALT: 14 U/L (ref 0–35)
AST: 58 U/L — ABNORMAL HIGH (ref 0–37)
Alkaline Phosphatase: 120 U/L — ABNORMAL HIGH (ref 39–117)
CO2: 16 mEq/L — ABNORMAL LOW (ref 19–32)
Calcium: 5 mg/dL — CL (ref 8.4–10.5)
Chloride: 104 mEq/L (ref 96–112)
GFR calc non Af Amer: 65 mL/min — ABNORMAL LOW (ref >90)
Potassium: 2.2 mEq/L — CL (ref 3.5–5.1)
Sodium: 139 mEq/L (ref 135–145)

## 2011-04-04 NOTE — Telephone Encounter (Signed)
Called the patient. Informed of the critical laboratory values. Patient agreed to be admitted to the hospital "but in the evening after her grandchild" comes back from school. Importance of quick admission was emphasized. Risks of multiple and severe electrolytes derangement was explained to the patient (including risk of seizures, cardiac arrhythmias, falls, an death). Will arrange a direct admission to a Medicine Teaching service and/or a behavioral health admission for alcohol detox. Case was discussed with Dr. Phillips Odor and [facility].

## 2011-04-04 NOTE — H&P (Signed)
Hospital Admission Note Date: 04/04/2011  Patient name: [name] [name] Medical record number: [id number] Date of birth: February 09, 1956 Age: 55 y.o. Gender: female PCP: Deatra Robinson, MD  Medical Service: Internal Medicine Teaching Service  Attending physician:  Cliffton Asters    1st Contact: Janalyn Harder   Pager: [phone] 2nd Contact: Almyra Deforest   Pager: [phone] After 5 pm or weekends: 1st Contact:      Pager: [phone] 2nd Contact:      Pager: [phone]  Chief Complaint: Hypomagnesemia, Hypokalemia  History of Present Illness: The patient is a 55 yo woman, history of alcoholism, prior hypomagnesemia, prior hypocalcemia, vit B12 deficiency anemia, presenting with a 2-week history of whole-body "tingling" and numbness.  The patient notes a 2-week history of progressively worsening, constant "tingling" in her feet, legs, hands, arms, and face, along with occasional accompanying numbness in these areas.  She also notes occasional chills during this time period, but no fevers.  She notes dizziness for the last 3 weeks, presenting as feeling as if the room were spinning, worse when standing from a seated or supine position, relieved by sitting down.  She also notes a 2-day history of nausea and vomiting, occurring twice/day, typically in the a.m., typically when standing from a seated/supine position.  She notes that she has been drinking about 2 bottles of wine per day, and that she has only been drinking a few glasses of water/day.  She believes she has been eating a full diet recently, and drinking ensure once/day.  She notes that she remembers to take her vitamin supplements 4-5 days/week, but that she takes her magnesium supplementation less often due to a side effect of nausea, and she has not started taking potassium supplementation as prescribed 03/13/11.  The patient presented to clinic the day prior to admission with these complaints, and labs were drawn, revealing potassium = 2.7, magnesium =  0.7.  Meds: Current Outpatient Prescriptions   Medication  Sig  Dispense  Refill   .  amLODipine (NORVASC) 2.5 MG tablet  Take 1 tablet (2.5 mg total) by mouth daily.  30 tablet  11   .  calcium carbonate (TUMS - DOSED IN MG ELEMENTAL CALCIUM) 500 MG chewable tablet  Chew 1 tablet (200 mg of elemental calcium total) by mouth 3 (three) times daily.  90 tablet  11   .  Digestive Enzymes TABS  24,000 units 1 tab three times daily with meals  90 tablet  3   .  ergocalciferol (VITAMIN D2) 50000 UNITS capsule  Take 1 capsule (50,000 Units total) by mouth once a week.  4 capsule  3   .  folic acid (FOLVITE) 1 MG tablet  Take 1 tablet (1 mg total) by mouth daily.  30 tablet  11   .  magnesium oxide (MAG-OX) 400 MG tablet  Take 2 tablets (800 mg total) by mouth 2 (two) times daily.  120 tablet  6   .  Nutritional Supplements (ENSURE PLUS HN) LIQD  Take 237 mLs by mouth 3 (three) times daily with meals.     Marland Kitchen  omeprazole (PRILOSEC) 40 MG capsule  Take 1 capsule (40 mg total) by mouth daily.  30 capsule  11   .  oxyCODONE-acetaminophen (PERCOCET) 5-325 MG per tablet  Take 1 tablet by mouth every 6 (six) hours as needed.     .  potassium chloride SA (K-DUR,KLOR-CON) 20 MEQ tablet  Take 1 tablet (20 mEq total) by mouth 2 (two) times daily.  60  tablet  0   .  promethazine (PHENERGAN) 12.5 MG tablet  Take 1 tablet (12.5 mg total) by mouth every 6 (six) hours as needed for nausea.  30 tablet  0   .  Thiamine HCl (VITAMIN B-1) 100 MG tablet  Take 1 tablet (100 mg total) by mouth daily.  30 tablet  11   .  DISCONTD: oxyCODONE-acetaminophen (PERCOCET) 5-325 MG per tablet  Take 1 tablet by mouth every 6 (six) hours as needed.  90 tablet  0     Allergies: Amitriptyline - facial swelling Doxycycline - itching  Past Medical History  Diagnosis Date  . Anemia, B12 deficiency   . History of acute pancreatitis   . Right knee pain     No recent imaging on chart  . 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 Dr. Su Hilt, [facility]  . Hypertriglyceridemia   . GERD (gastroesophageal reflux disease)   . Vitamin D deficiency   . Subdural hematoma 02/2008    Likely 2/2 trauma from seizure from EtOH withdrawal, chronic in nature, sees Dr. Robyne Askew. Most recent CT head 10/2009 showing stable but persistent hematoma without mass effect.  . History of seizure disorder     Likely 2/2 alcohol abuse  . Hypocalcemia   . Hypomagnesemia   . Failure to thrive     Unclear etiology  . HTN (hypertension)   . Thrombocytopenia   . Anemia, macrocytic   . Hepatomegaly     On exam  . Alcohol abuse - history of prior DT's 15 yrs ago, requiring intubation.  Patient has quit/relapsed several times in the past.    Family History  Problem Relation Age of Onset  . Cancer Mother     Died from stomach cancer and "flesh eating rash  . Heart failure Father     Died in 2s from an MI  . Alcohol abuse Sister     Twin sister drinks a lot  . Stroke Brother     Has 7 brothers, 1 with CVA   History   Social History  . Marital Status: Divorced    Spouse Name: N/A    Number of Children: N/A  . Years of Education: N/A   Occupational History  . Not on file.   Social History Main Topics  . Smoking status: Former Smoker    Types: Cigarettes    Quit date: 09/20/2010  . Smokeless tobacco: Not on file  . Alcohol Use: No     In treatment at the [facility]  . Drug Use: No  . Sexually Active: Not on file   Other Topics Concern  . Not on file   Social History Narrative   Lives with her significant other and 2 grandchildren.Has 7 brothers and 4 sisters, 1 twin sister.Unemployed, worked in [employer]. Abuses alcoholNo drug use.    Review of Systems: ROS: General: no fevers, chills, changes in weight, changes in appetite Skin: no rash HEENT: no blurry vision, hearing changes, sore throat Pulm: no dyspnea, coughing, wheezing CV: no chest pain,  palpitations, shortness of breath Abd: no abdominal pain, diarrhea/constipation GU: no dysuria, hematuria, polyuria Neuro: see HPI  Physical Exam:  General: alert, appears malnourished, cooperative, and in no apparent distress HEENT: pupils equal round and reactive to light, vision grossly intact, oropharynx clear and non-erythematous  Neck: supple, no lymphadenopathy, no JVD Lungs: clear to ascultation bilaterally, normal work of respiration, no wheezes, rales,  ronchi Heart: regular rate and rhythm, no murmurs, gallops, or rubs Abdomen: soft, non-tender, non-distended, normal bowel sounds Extremities: no cyanosis, clubbing, or edema Neurologic: alert & oriented X3, cranial nerves II-XII intact, strength 5/5 in all 4 extremities, sensation intact to light touch throughout, reflexes 1+ throughout, toes neutral.   Lab results: Basic Metabolic Panel:  Basename 04/03/11 1512  NA 140  K 2.7*  CL 106  CO2 17*  GLUCOSE 127*  BUN 5*  CREATININE 1.16*  CALCIUM <5.0*  MG 0.7*  PHOS --   Liver Function Tests:  Basename 04/03/11 1512  AST 59*  ALT 13  ALKPHOS 124*  BILITOT 0.3  PROT 7.7  ALBUMIN 3.2*   CBC:  Basename 04/03/11 1513  WBC 5.2  NEUTROABS --  HGB 10.5*  HCT 30.6*  MCV 103.0*  PLT 168   Thyroid Function Tests:  Basename 04/03/11 1512  TSH 0.506  T4TOTAL --  FREET4 --  T3FREE --  THYROIDAB --   Anemia Panel:  Basename 04/03/11 1512  VITAMINB12 1423*  FOLATE --  FERRITIN --  TIBC --  IRON --  RETICCTPCT --   Alcohol Level:  Basename 04/03/11 1513  ETH 140*    Assessment & Plan by Problem: Patient is a 55 yo woman, history of alcohol abuse causing prior electrolyte disturbances, presenting to clinic the day prior to admission with bilateral arm, leg, and face tingling and numbness, with labs showing hypokalemia and hypomagnesemia.  1. Hypomagnesemia/Hypokalemia - the patient has a history of electrolyte disturbances likely caused by chronic  alcohol use, though also contributed to by inconsistent use of vitamin supplementations.  The patient's tingling/numbness and hyporeflexia is likely a result of these electrolyte deficiencies.  Checking calcium levels as well. -admit to telemetry -EKG now and in a.m. -K-Cl 40 mEq PO x3 doses -Mg 2g IV over 4 hrs -NS IVF 150 cc/hr x 12 hours, then 75 cc/hr -checking ionized calcium level  2. Alcohol abuse - patient notes a history of alcohol abuse, currently drinking 2 bottles of wine/day.  Patient has a history of DT's, previously requiring intubation.  Patient is amenable to discussing alcohol cessation during this hospitalization, and notes prior quit attempts/relapses. -CIWA protocol, IV ativan prn -will counsel patient on alcohol cessation  3. Malnutrition - patient has a history of malnutrition, likely secondary to chronic alcohol use -ensure TID -regular diet  4. Prophy - lovenox    R2/3______________________________ Johnette Abraham     R1________________________________ Janalyn Harder  ATTENDING: I performed and/or observed a history and physical examination of the patient.  I discussed the case with the residents as noted and reviewed the residents' notes.  I agree with the findings and plan--please refer to the attending physician note for more details.  Signature________________________________  Printed Name_____________________________

## 2011-04-04 NOTE — Progress Notes (Deleted)
  Subjective:    [name], female    DOB: [birth year], 55 y.o.   MRN: [id number]  HPI    Review of Systems     Objective:   Physical Exam        Assessment & Plan:

## 2011-04-04 NOTE — Progress Notes (Signed)
Subjective:   [name] female   DOB: 30-Jun-1955 55 y.o.   MRN: [id number]  HPI: [name] is a 55 y.o.  Woman c/o tingling and numbness on both sides of her face and arms and legs bilaterally. States that drank only one beer the night prior to OV. Denis any HA, visual changes, palpitations, CP, SOB, Orthopnea, tremors, leg swelling or any other Sx.    Past Medical History  Diagnosis Date  . Anemia, B12 deficiency   . History of acute pancreatitis   . Right knee pain     No recent imaging on chart  . 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 Dr. Su Hilt, [facility]  . Hypertriglyceridemia   . GERD (gastroesophageal reflux disease)   . Vitamin D deficiency   . Subdural hematoma 02/2008    Likely 2/2 trauma from seizure from EtOH withdrawal, chronic in nature, sees Dr. Robyne Askew. Most recent CT head 10/2009 showing stable but persistent hematoma without mass effect.  . History of seizure disorder     Likely 2/2 alcohol abuse  . Hypocalcemia   . Hypomagnesemia   . Failure to thrive     Unclear etiology  . HTN (hypertension)   . Thrombocytopenia   . Anemia, macrocytic   . Hepatomegaly     On exam  . Alcohol abuse    Current Outpatient Prescriptions  Medication Sig Dispense Refill  . amLODipine (NORVASC) 2.5 MG tablet Take 1 tablet (2.5 mg total) by mouth daily.  30 tablet  11  . calcium carbonate (TUMS - DOSED IN MG ELEMENTAL CALCIUM) 500 MG chewable tablet Chew 1 tablet (200 mg of elemental calcium total) by mouth 3 (three) times daily.  90 tablet  11  . Digestive Enzymes TABS 24,000 units 1 tab three times daily with meals  90 tablet  3  . ergocalciferol (VITAMIN D2) 50000 UNITS capsule Take 1 capsule (50,000 Units total) by mouth once a week.  4 capsule  3  . folic acid (FOLVITE) 1 MG tablet Take 1 tablet (1 mg total) by mouth daily.  30 tablet  11  . magnesium oxide (MAG-OX) 400 MG  tablet Take 2 tablets (800 mg total) by mouth 2 (two) times daily.  120 tablet  6  . Nutritional Supplements (ENSURE PLUS HN) LIQD Take 237 mLs by mouth 3 (three) times daily with meals.        Marland Kitchen omeprazole (PRILOSEC) 40 MG capsule Take 1 capsule (40 mg total) by mouth daily.  30 capsule  11  . oxyCODONE-acetaminophen (PERCOCET) 5-325 MG per tablet Take 1 tablet by mouth every 6 (six) hours as needed.        . potassium chloride SA (K-DUR,KLOR-CON) 20 MEQ tablet Take 1 tablet (20 mEq total) by mouth 2 (two) times daily.  60 tablet  0  . promethazine (PHENERGAN) 12.5 MG tablet Take 1 tablet (12.5 mg total) by mouth every 6 (six) hours as needed for nausea.  30 tablet  0  . Thiamine HCl (VITAMIN B-1) 100 MG tablet Take 1 tablet (100 mg total) by mouth daily.  30 tablet  11  . DISCONTD: oxyCODONE-acetaminophen (PERCOCET) 5-325 MG per tablet Take 1 tablet by mouth every 6 (six) hours as needed.  90 tablet  0   Current Facility-Administered Medications  Medication Dose Route Frequency Provider Last Rate Last Dose  . cyanocobalamin ((VITAMIN B-12)) injection 1,000 mcg  1,000 mcg  Intramuscular Q30 days Coby Shrewsberry   1,000 mcg at 12/20/10 1440  . potassium chloride (K-DUR,KLOR-CON) CR tablet 40 mEq  40 mEq Oral Once Deatra Robinson       Family History  Problem Relation Age of Onset  . Cancer Mother     Died from stomach cancer and "flesh eating rash  . Heart failure Father     Died in 5s from an MI  . Alcohol abuse Sister     Twin sister drinks a lot  . Stroke Brother     Has 7 brothers, 1 with CVA   History   Social History  . Marital Status: Divorced    Spouse Name: N/A    Number of Children: N/A  . Years of Education: N/A   Social History Main Topics  . Smoking status: Former Smoker    Types: Cigarettes    Quit date: 09/20/2010  . Smokeless tobacco: None  . Alcohol Use: No     In treatment at the [facility]  . Drug Use: No  . Sexually Active: None   Other Topics Concern    . None   Social History Narrative   Lives with her significant other and 2 grandchildren.Has 7 brothers and 4 sisters, 1 twin sister.Unemployed, worked in [employer]. Abuses alcoholNo drug use.   Review of Systems: Constitutional: Denies fever, chills, diaphoresis, appetite change and fatigue.  HEENT: Denies photophobia, eye pain, redness, hearing loss, ear pain, congestion, sore throat, rhinorrhea, sneezing, mouth sores, trouble swallowing, neck pain, neck stiffness and tinnitus.   Respiratory: Denies SOB, DOE, cough, chest tightness,  and wheezing.   Cardiovascular: Denies chest pain, palpitations and leg swelling.  Gastrointestinal: Denies nausea, vomiting, abdominal pain, diarrhea, constipation, blood in stool and abdominal distention.  Genitourinary: Denies dysuria, urgency, frequency, hematuria, flank pain and difficulty urinating.  Musculoskeletal: Denies myalgias, back pain, joint swelling, arthralgias and gait problem.  Skin: Denies pallor, rash and wound.  Neurological: Denies dizziness, seizures, syncope, weakness, light-headedness, numbness and headaches.  Hematological: Denies adenopathy. Easy bruising, personal or family bleeding history  Psychiatric/Behavioral: Denies suicidal ideation, mood changes, confusion, nervousness, sleep disturbance and agitation  Objective:  Physical Exam: Filed Vitals:   04/03/11 1400 04/03/11 1403  BP: 88/66 89/67  Pulse: 101 93  Temp:  97.9 F (36.6 C)  TempSrc:  Oral  Height:  5\' 1"  (1.549 m)  Weight:  83 lb 1.6 oz (37.694 kg)   Constitutional: Vital signs reviewed.  Patient is a well-developed and ectomorph and in no acute distress and cooperative with exam. Alert and oriented x3.  Head: Normocephalic and atraumatic Ear: TM normal bilaterally Mouth: no erythema or exudates, MMM Eyes: PERRL, EOMI, conjunctivae normal, No scleral icterus.  Neck: Supple, Trachea midline normal ROM, No JVD, mass, thyromegaly, or carotid bruit present.   Cardiovascular: RRR, S1 normal, S2 normal, no MRG, pulses symmetric and intact bilaterally Pulmonary/Chest: CTAB, no wheezes, rales, or rhonchi Abdominal: Soft. Non-tender, non-distended, bowel sounds are normal, no masses, organomegaly, or guarding present.  GU: no CVA tenderness Musculoskeletal: No joint deformities, erythema, or stiffness, ROM full and no nontender Hematology: no cervical, inginal, or axillary adenopathy.  Neurological: A&O x3, Strenght is normal and symmetric bilaterally, cranial nerve II-XII are grossly intact, no focal motor deficit, sensory intact to light touch bilaterally.  Skin: Warm, dry and intact. No rash, cyanosis, or clubbing.  Psychiatric: Normal mood and affect. speech and behavior is normal. Judgment and thought content normal. Cognition and memory are normal.  Assessment & Plan:    1. Multiple electrolyte derangements due to alcoholism, malnutrition and lack of adherence with her medications. - 30 min discussion on risks of alcoholism and electrolyte derangements (seizures, muscle weakness, hear problems, death). Patient verbalized understanding. -Strongly advised to STOP drinking ALCHOL!  -Patient refused hospital admission and/or referral to behavioral Health. Patient was explained risks/consequences of her decision and she verbalized understanding. -Instructed to call 911 and/go to ED if starts feeling worse and/or changes her mind.

## 2011-04-05 DIAGNOSIS — F1010 Alcohol abuse, uncomplicated: Secondary | ICD-10-CM

## 2011-04-05 DIAGNOSIS — E876 Hypokalemia: Secondary | ICD-10-CM

## 2011-04-05 LAB — BASIC METABOLIC PANEL
BUN: 7 mg/dL (ref 6–23)
BUN: 6 mg/dL (ref 6–23)
BUN: 5 mg/dL — ABNORMAL LOW (ref 6–23)
CO2: 17 mEq/L — ABNORMAL LOW (ref 19–32)
CO2: 13 mEq/L — ABNORMAL LOW (ref 19–32)
CO2: 14 mEq/L — ABNORMAL LOW (ref 19–32)
Chloride: 111 mEq/L (ref 96–112)
Chloride: 109 mEq/L (ref 96–112)
GFR calc non Af Amer: 79 mL/min — ABNORMAL LOW (ref >90)
GFR calc non Af Amer: 75 mL/min — ABNORMAL LOW (ref >90)
Glucose, Bld: 132 mg/dL — ABNORMAL HIGH (ref 70–99)
Glucose, Bld: 162 mg/dL — ABNORMAL HIGH (ref 70–99)
Glucose, Bld: 203 mg/dL — ABNORMAL HIGH (ref 70–99)
Potassium: 4.9 mEq/L (ref 3.5–5.1)
Potassium: 4.8 mEq/L (ref 3.5–5.1)
Potassium: 4.3 mEq/L (ref 3.5–5.1)

## 2011-04-05 LAB — BASIC METABOLIC PANEL WITH GFR
BUN: 7 mg/dL (ref 6–23)
CO2: 17 meq/L — ABNORMAL LOW (ref 19–32)
Calcium: 4.9 mg/dL — CL (ref 8.4–10.5)
Chloride: 106 meq/L (ref 96–112)
Creatinine, Ser: 0.89 mg/dL (ref 0.50–1.10)
GFR calc Af Amer: 84 mL/min — ABNORMAL LOW
GFR calc non Af Amer: 72 mL/min — ABNORMAL LOW
Glucose, Bld: 106 mg/dL — ABNORMAL HIGH (ref 70–99)
Potassium: 2.5 meq/L — CL (ref 3.5–5.1)
Sodium: 140 meq/L (ref 135–145)

## 2011-04-05 LAB — MAGNESIUM
Magnesium: 1.5 mg/dL (ref 1.5–2.5)
Magnesium: 1.7 mg/dL (ref 1.5–2.5)
Magnesium: 2 mg/dL (ref 1.5–2.5)

## 2011-04-05 LAB — DRUGS OF ABUSE SCREEN W/O ALC, ROUTINE URINE
Benzodiazepines.: NEGATIVE
Marijuana Metabolite: NEGATIVE
Methadone: NEGATIVE
Opiate Screen, Urine: NEGATIVE

## 2011-04-06 DIAGNOSIS — F1010 Alcohol abuse, uncomplicated: Secondary | ICD-10-CM

## 2011-04-06 DIAGNOSIS — E876 Hypokalemia: Secondary | ICD-10-CM

## 2011-04-06 LAB — BASIC METABOLIC PANEL
CO2: 16 mEq/L — ABNORMAL LOW (ref 19–32)
CO2: 14 mEq/L — ABNORMAL LOW (ref 19–32)
CO2: 17 mEq/L — ABNORMAL LOW (ref 19–32)
Calcium: 7.2 mg/dL — ABNORMAL LOW (ref 8.4–10.5)
Chloride: 111 mEq/L (ref 96–112)
GFR calc non Af Amer: 73 mL/min — ABNORMAL LOW (ref >90)
GFR calc non Af Amer: 71 mL/min — ABNORMAL LOW (ref >90)
Glucose, Bld: 91 mg/dL (ref 70–99)
Glucose, Bld: 118 mg/dL — ABNORMAL HIGH (ref 70–99)
Glucose, Bld: 120 mg/dL — ABNORMAL HIGH (ref 70–99)
Potassium: 4.6 mEq/L (ref 3.5–5.1)
Potassium: 4.5 mEq/L (ref 3.5–5.1)
Potassium: 4.2 mEq/L (ref 3.5–5.1)
Sodium: 137 mEq/L (ref 135–145)
Sodium: 137 mEq/L (ref 135–145)
Sodium: 134 mEq/L — ABNORMAL LOW (ref 135–145)

## 2011-04-06 LAB — MAGNESIUM
Magnesium: 1.2 mg/dL — ABNORMAL LOW (ref 1.5–2.5)
Magnesium: 1.7 mg/dL (ref 1.5–2.5)

## 2011-04-06 LAB — CALCIUM, IONIZED
Calcium, Ion: 0.88 mmol/L — ABNORMAL LOW (ref 1.12–1.32)
Calcium, Ion: 1.05 mmol/L — ABNORMAL LOW (ref 1.12–1.32)

## 2011-04-07 DIAGNOSIS — F1010 Alcohol abuse, uncomplicated: Secondary | ICD-10-CM

## 2011-04-07 DIAGNOSIS — E876 Hypokalemia: Secondary | ICD-10-CM

## 2011-04-07 LAB — VITAMIN B1: Vitamin B1 (Thiamine): <7 nmol/L — ABNORMAL LOW (ref 8–30)

## 2011-04-07 LAB — MAGNESIUM: Magnesium: 2 mg/dL (ref 1.5–2.5)

## 2011-04-07 LAB — BASIC METABOLIC PANEL
Calcium: 7.8 mg/dL — ABNORMAL LOW (ref 8.4–10.5)
GFR calc non Af Amer: 70 mL/min — ABNORMAL LOW (ref >90)
Glucose, Bld: 102 mg/dL — ABNORMAL HIGH (ref 70–99)
Potassium: 4.2 mEq/L (ref 3.5–5.1)
Sodium: 136 mEq/L (ref 135–145)

## 2011-04-07 LAB — CALCIUM, IONIZED: Calcium, Ion: 1.19 mmol/L (ref 1.12–1.32)

## 2011-04-10 ENCOUNTER — Ambulatory Visit (INDEPENDENT_AMBULATORY_CARE_PROVIDER_SITE_OTHER): Payer: PRIVATE HEALTH INSURANCE | Admitting: Ophthalmology

## 2011-04-10 ENCOUNTER — Encounter: Payer: Self-pay | Admitting: Ophthalmology

## 2011-04-10 DIAGNOSIS — Z23 Encounter for immunization: Secondary | ICD-10-CM

## 2011-04-10 DIAGNOSIS — E538 Deficiency of other specified B group vitamins: Secondary | ICD-10-CM

## 2011-04-10 DIAGNOSIS — M533 Sacrococcygeal disorders, not elsewhere classified: Secondary | ICD-10-CM

## 2011-04-10 DIAGNOSIS — Z5181 Encounter for therapeutic drug level monitoring: Secondary | ICD-10-CM

## 2011-04-10 DIAGNOSIS — F1010 Alcohol abuse, uncomplicated: Secondary | ICD-10-CM

## 2011-04-10 MED ORDER — CALCIUM CARBONATE ANTACID 500 MG PO CHEW: 1.0000 | CHEWABLE_TABLET | Freq: Every day | ORAL | Status: DC

## 2011-04-10 MED ORDER — OXYCODONE-ACETAMINOPHEN 5-325 MG PO TABS: 1.0000 | ORAL_TABLET | Freq: Four times a day (QID) | ORAL | Status: DC | PRN

## 2011-04-10 MED ORDER — HYDROXYZINE HCL 10 MG PO TABS: 10.0000 mg | ORAL_TABLET | Freq: Three times a day (TID) | ORAL | Status: DC | PRN

## 2011-04-10 MED ORDER — CYANOCOBALAMIN 1000 MCG/ML IJ SOLN
1000.0000 ug | Freq: Once | INTRAMUSCULAR | Status: AC
  Administered 2011-04-10: 1000 ug via INTRAMUSCULAR

## 2011-04-10 NOTE — Progress Notes (Signed)
Subjective:   [name] female   DOB: [birth year] 55 y.o.   MRN: [id number]  HPI: [name] is a 56 y.o. woman with alcoholism who presents for hospital follow up after having severe electrolyte disturbances.  Electrolytes: tingling resolved, still a little numbness in fingers & toes and a little bit of chills occasionally -doing pretty well with eating- more than was eating previously. Yesterday ate pork chop, candied yams, green beans & biscuit for dinner, for breakfast had rice & salmon croquets. She is the main cook- spends about 2 hours cooking dinner daily. -just left hospital on Friday, picked up magnesium pills last night on Sunday. Haven't taken yet since felll asleep last night soon after coming home. Will try new formulations of magnesium and see if it makes her feel nauseous. Needs prescription for calcium. She says that homecare is coming to give her a pill box and try to help her with medication compliance.  Alcoholism: -hasn't had any alcohol to drink since admission. Was drinking 2 bottles of wine/day. -Go to [facility]- 3X/week- works with Recruitment consultant 1 on one meeting but otherwise group meetings. MWF from 9-11:30. -Summer house- AA meetings- on saturdays, didn't go on Saturday, wouldn't give reason -husband has been alcohol free for 30-35 years. No alcohol at the house. Husband gets mad if she is drinking. He won't buy alcohol for her. Father & mother, 2 brothers and twin sister all are alcoholic. Older sister & daughter are the only family members who don't drink alcohol. Daughter's children- one 93 years old and 33 108 years old, (both boys) live with her. She takes care of them, though her daughter does not live with them.  Pain: -back/hip pain- narcotics contract signed 03/13/11. Will get UDS today. Patient says she has one pill left. Though she says she hasn't been taking 3 pills a day initially, with probing she agrees that she must be  taking 3 pills/day if she is out of pills in one month. Admits to sometimes skipping midday pill and taking two pills at bedtime to help her sleep. Denies giving any of her pills to her husband who is also a patient of the clinic.  Past Medical History  Diagnosis Date  . Anemia, B12 deficiency   . History of acute pancreatitis   . Right knee pain     No recent imaging on chart  . 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 Dr. Su Hilt, [facility]  . Hypertriglyceridemia   . GERD (gastroesophageal reflux disease)   . Vitamin D deficiency   . Subdural hematoma 02/2008    Likely 2/2 trauma from seizure from EtOH withdrawal, chronic in nature, sees Dr. Robyne Askew. Most recent CT head 10/2009 showing stable but persistent hematoma without mass effect.  . History of seizure disorder     Likely 2/2 alcohol abuse  . Hypocalcemia   . Hypomagnesemia   . Failure to thrive     Unclear etiology  . HTN (hypertension)   . Thrombocytopenia   . Anemia, macrocytic   . Hepatomegaly     On exam  . Alcohol abuse    Current Outpatient Prescriptions  Medication Sig Dispense Refill  . magnesium chloride (SLOW-MAG) 64 MG TBEC Take 2 tablets by mouth 2 (two) times daily.        Marland Kitchen oxyCODONE-acetaminophen (ROXICET) 5-325 MG per tablet Take 1 tablet by mouth every 6 (six) hours as needed  for pain (take as needed up to 3x a day for pain. Do not take if you do no need the medication.).  75 tablet  0  . acetaminophen (TYLENOL) 325 MG tablet Take 650 mg by mouth every 6 (six) hours as needed. pain       . amLODipine (NORVASC) 2.5 MG tablet Take 1 tablet (2.5 mg total) by mouth daily.  30 tablet  11  . calcium carbonate (TUMS - DOSED IN MG ELEMENTAL CALCIUM) 500 MG chewable tablet Chew 1 tablet (200 mg of elemental calcium total) by mouth 3 (three) times daily.  90 tablet  11  . Digestive Enzymes TABS 24,000 units 1 tab three times daily with meals  90  tablet  3  . ergocalciferol (VITAMIN D2) 50000 UNITS capsule Take 1 capsule (50,000 Units total) by mouth once a week.  4 capsule  3  . folic acid (FOLVITE) 1 MG tablet Take 1 tablet (1 mg total) by mouth daily.  30 tablet  11  . hydrOXYzine (ATARAX/VISTARIL) 10 MG tablet Take 1 tablet (10 mg total) by mouth 3 (three) times daily as needed for itching.  30 tablet  0  . omeprazole (PRILOSEC) 40 MG capsule Take 1 capsule (40 mg total) by mouth daily.  30 capsule  11  . potassium chloride SA (K-DUR,KLOR-CON) 20 MEQ tablet Take 1 tablet (20 mEq total) by mouth 2 (two) times daily.  60 tablet  0  . Thiamine HCl (VITAMIN B-1) 100 MG tablet Take 1 tablet (100 mg total) by mouth daily.  30 tablet  11   Family History  Problem Relation Age of Onset  . Cancer Mother     Died from stomach cancer and "flesh eating rash  . Heart failure Father     Died in 36s from an MI  . Alcohol abuse Sister     Twin sister drinks a lot  . Stroke Brother     Has 7 brothers, 1 with CVA   History   Social History  . Marital Status: Divorced    Spouse Name: N/A    Number of Children: N/A  . Years of Education: N/A   Social History Main Topics  . Smoking status: Former Smoker    Types: Cigarettes    Quit date: 09/20/2010  . Smokeless tobacco: None  . Alcohol Use: No     In treatment at the [facility]  . Drug Use: No  . Sexually Active: None   Social History Narrative   Lives with her significant other and 2 grandchildren.Has 7 brothers and 4 sisters, 1 twin sister.Unemployed, worked in [employer]. Abuses alcoholNo drug use.   Review of Systems: Gastrointestinal: Denies nausea, vomiting, abdominal pain Musculoskeletal: Reports back pain  Neurological: Denies tingling.   Objective:  Physical Exam: Filed Vitals:   04/10/11 1015  BP: 129/93  Pulse: 82  Temp: 97 F (36.1 C)  TempSrc: Oral  Height: 5\' 1"  (1.549 m)  Weight: 84 lb 6.4 oz (38.284 kg)  SpO2: 100%   Constitutional: Vital signs  reviewed.  Patient is a somewhat wasted woman in no acute distress and cooperative with exam.  Eyes: PERRL, EOMI, conjunctivae normal, No scleral icterus.  Pupils constricted- 2mm bilaterally. Cardiovascular: RRR, S1 normal, S2 normal, no MRG, pulses symmetric and intact bilaterally Pulmonary/Chest: CTAB, no wheezes, rales, or rhonchi Abdominal: Soft. Non-tender, non-distended, bowel sounds are normal, no masses or guarding present.  Neurological: A&O x3, Strenght is normal and symmetric bilaterally, cranial nerve II-XII  are grossly intact Skin: Warm, dry and intact. No rash, cyanosis, or clubbing.  Psychiatric: Normal mood and affect. Speech is somewhat difficult to understand. Behavior is slightly odd, patient is sleeping upon return to exam room. Judgment and thought content normal. Cognition and memory are normal.   Assessment & Plan:

## 2011-04-10 NOTE — Assessment & Plan Note (Addendum)
Patient is currently abstaining from alcohol since her admission and is following up with AA and a three times weekly program. We congratulated her on her progress and urged her to continue to be alcohol free. Will follow up with her regularly to give support.

## 2011-04-10 NOTE — Patient Instructions (Signed)
Please make sure you take the calcium, magnesium and potassium as directed each day. It is essential that you take these medications so that you don't have to go back to the hospital with the tingling sensation. If you have problems with the magnesium pills, please le tus know.

## 2011-04-10 NOTE — Assessment & Plan Note (Signed)
Restated the importance of taking this medication. Advised patient that her heart could stop or she could have seizures if she continues to have electrolyte disturbances and stop her medications.

## 2011-04-10 NOTE — Assessment & Plan Note (Addendum)
Patient was having nausea/vomiting with magnesium oxide so she was switched to magnesium chloride as an inpatient. Patient has not started on this medication yet. Restated the importance of this medication and asked patient to call if she is unable to tolerate new magnesium pills.

## 2011-04-10 NOTE — Assessment & Plan Note (Signed)
Patient is currently on narcotics contract. Given her history of alcohol abuse and constricted pupils I would try to decrease patient's opiate use gradually or switch her to a less strong opiate. It sounds like her main need for pain medications is at night to allow her to sleep. I prescribed her 75 pills for 1 months since she is not taking the full 90 pills and seems to be saving some of them.

## 2011-04-11 LAB — URINE DRUGS OF ABUSE SCREEN W ALC, ROUTINE (REF LAB)
Benzodiazepines.: NEGATIVE
Creatinine,U: 61.1 mg/dL
Methadone: NEGATIVE
Phencyclidine (PCP): NEGATIVE
Propoxyphene: NEGATIVE

## 2011-04-13 LAB — OPIATE, QUANTITATIVE, URINE: Codeine Urine: NEGATIVE NG/ML

## 2011-04-13 LAB — ALCOHOL, ETHYL, QN, URINE (REFLEX): Alcohol Ethyl, Qn: 0.255 GMS% — ABNORMAL HIGH (ref ?–0.04)

## 2011-04-17 ENCOUNTER — Emergency Department (HOSPITAL_COMMUNITY)
Admission: EM | Admit: 2011-04-17 | Discharge: 2011-04-17 | Disposition: A | Discharge status: Home / Self Care | Payer: PRIVATE HEALTH INSURANCE | Attending: Emergency Medicine | Admitting: Emergency Medicine

## 2011-04-17 ENCOUNTER — Encounter (HOSPITAL_COMMUNITY): Payer: Self-pay | Admitting: Emergency Medicine

## 2011-04-17 ENCOUNTER — Emergency Department (HOSPITAL_COMMUNITY): Payer: PRIVATE HEALTH INSURANCE

## 2011-04-17 DIAGNOSIS — M25519 Pain in unspecified shoulder: Secondary | ICD-10-CM | POA: Insufficient documentation

## 2011-04-17 DIAGNOSIS — M25419 Effusion, unspecified shoulder: Secondary | ICD-10-CM | POA: Insufficient documentation

## 2011-04-17 DIAGNOSIS — I10 Essential (primary) hypertension: Secondary | ICD-10-CM | POA: Insufficient documentation

## 2011-04-17 DIAGNOSIS — Y92009 Unspecified place in unspecified non-institutional (private) residence as the place of occurrence of the external cause: Secondary | ICD-10-CM | POA: Insufficient documentation

## 2011-04-17 DIAGNOSIS — S42213A Unspecified displaced fracture of surgical neck of unspecified humerus, initial encounter for closed fracture: Secondary | ICD-10-CM

## 2011-04-17 DIAGNOSIS — W1789XA Other fall from one level to another, initial encounter: Secondary | ICD-10-CM | POA: Insufficient documentation

## 2011-04-17 DIAGNOSIS — S42309A Unspecified fracture of shaft of humerus, unspecified arm, initial encounter for closed fracture: Secondary | ICD-10-CM

## 2011-04-17 DIAGNOSIS — G40909 Epilepsy, unspecified, not intractable, without status epilepticus: Secondary | ICD-10-CM | POA: Insufficient documentation

## 2011-04-17 DIAGNOSIS — Z79899 Other long term (current) drug therapy: Secondary | ICD-10-CM | POA: Insufficient documentation

## 2011-04-17 DIAGNOSIS — K219 Gastro-esophageal reflux disease without esophagitis: Secondary | ICD-10-CM | POA: Insufficient documentation

## 2011-04-17 HISTORY — DX: Unspecified displaced fracture of surgical neck of unspecified humerus, initial encounter for closed fracture: S42.213A

## 2011-04-17 MED ORDER — MORPHINE SULFATE 4 MG/ML IJ SOLN: 4.0000 mg | INTRAMUSCULAR | Status: DC

## 2011-04-17 MED ORDER — OXYCODONE-ACETAMINOPHEN 5-325 MG PO TABS: 2.0000 | ORAL_TABLET | ORAL | Status: DC | PRN

## 2011-04-17 MED ORDER — MORPHINE SULFATE 4 MG/ML IJ SOLN
4.0000 mg | Freq: Once | INTRAMUSCULAR | Status: AC
  Administered 2011-04-17: 4 mg via INTRAMUSCULAR
  Filled 2011-04-17: qty 1

## 2011-04-17 NOTE — Progress Notes (Signed)
Orthopedic Tech Progress Note Patient Details:  [name] [name] 16-Aug-1955 [id number]  Other Ortho Devices Type of Ortho Device: Other (comment) (arm foam sling) Ortho Device Location: right arm Ortho Device Interventions: Application   Nikki Dom 04/17/2011, 2:37 PM

## 2011-04-17 NOTE — ED Provider Notes (Addendum)
History     CSN: [id number] Arrival date & time: 04/17/2011  1:11 PM   First MD Initiated Contact with Patient 04/17/11 1335      Chief Complaint  Patient presents with  . Fall   patient fell at home, landed on right shoulder. She states was recently admitted for "electrolyte imbalance." And no history of alcohol abuse. Patient states when she fell this morning. She did not hit her head or her neck. No loss of consciousness. Denies any numbness or tingling. Just complaining of pain throughout her right shoulder. No other complaints at this time  (Consider location/radiation/quality/duration/timing/severity/associated sxs/prior treatment) HPI  Past Medical History  Diagnosis Date  . Anemia, B12 deficiency   . History of acute pancreatitis   . Right knee pain     No recent imaging on chart  . 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 Dr. Su Hilt, [facility]  . Hypertriglyceridemia   . GERD (gastroesophageal reflux disease)   . Vitamin D deficiency   . Subdural hematoma 02/2008    Likely 2/2 trauma from seizure from EtOH withdrawal, chronic in nature, sees Dr. Robyne Askew. Most recent CT head 10/2009 showing stable but persistent hematoma without mass effect.  . History of seizure disorder     Likely 2/2 alcohol abuse  . Hypocalcemia   . Hypomagnesemia   . Failure to thrive     Unclear etiology  . HTN (hypertension)   . Thrombocytopenia   . Anemia, macrocytic   . Hepatomegaly     On exam  . Alcohol abuse     History reviewed. No pertinent past surgical history.  Family History  Problem Relation Age of Onset  . Cancer Mother     Died from stomach cancer and "flesh eating rash  . Heart failure Father     Died in 9s from an MI  . Alcohol abuse Sister     Twin sister drinks a lot, as did both her parents and brothers  . Stroke Brother     Has 7 brothers, 1 with CVA    History  Substance Use Topics  .  Smoking status: Former Smoker    Types: Cigarettes    Quit date: 09/20/2010  . Smokeless tobacco: Not on file  . Alcohol Use: No     In treatment at the [facility].    OB History    Grav Para Term Preterm Abortions TAB SAB Ect Mult Living                  Review of Systems  All other systems reviewed and are negative.    Allergies  Amitriptyline hcl and Doxycycline hyclate  Home Medications   Current Outpatient Rx  Name Route Sig Dispense Refill  . ACETAMINOPHEN 325 MG PO TABS Oral Take 650 mg by mouth every 6 (six) hours as needed. pain     . AMLODIPINE BESYLATE 2.5 MG PO TABS Oral Take 1 tablet (2.5 mg total) by mouth daily. 30 tablet 11  . CALCIUM CARBONATE ANTACID 500 MG PO CHEW Oral Chew 1 tablet (200 mg of elemental calcium total) by mouth 3 (three) times daily. 90 tablet 11  . DIGESTIVE ENZYMES PO TABS  24,000 units 1 tab three times daily with meals 90 tablet 3  . ERGOCALCIFEROL 50000 UNITS PO CAPS Oral Take 1 capsule (50,000 Units total) by mouth once a week. 4 capsule 3  . FOLIC ACID 1 MG  PO TABS Oral Take 1 tablet (1 mg total) by mouth daily. 30 tablet 11  . HYDROXYZINE HCL 10 MG PO TABS Oral Take 1 tablet (10 mg total) by mouth 3 (three) times daily as needed for itching. 30 tablet 0  . MAGNESIUM CHLORIDE 64 MG PO TBEC Oral Take 2 tablets by mouth 2 (two) times daily.      Marland Kitchen OMEPRAZOLE 40 MG PO CPDR Oral Take 1 capsule (40 mg total) by mouth daily. 30 capsule 11  . OXYCODONE-ACETAMINOPHEN 5-325 MG PO TABS Oral Take 1 tablet by mouth every 6 (six) hours as needed for pain (take as needed up to 3x a day for pain. Do not take if you do no need the medication.). 75 tablet 0  . POTASSIUM CHLORIDE CRYS CR 20 MEQ PO TBCR Oral Take 1 tablet (20 mEq total) by mouth 2 (two) times daily. 60 tablet 0  . VITAMIN B-1 100 MG PO TABS Oral Take 1 tablet (100 mg total) by mouth daily. 30 tablet 11    BP 115/81  Pulse 101  Temp(Src) 98.1 F (36.7 C) (Oral)  SpO2 99%  Physical  Exam  Constitutional: She is oriented to person, place, and time. She appears well-developed and well-nourished.  HENT:  Head: Normocephalic and atraumatic.  Eyes: Conjunctivae and EOM are normal. Pupils are equal, round, and reactive to light.  Neck: Neck supple.       No cervical spine tenderness.  Cardiovascular: Normal rate and regular rhythm.  Exam reveals no gallop and no friction rub.   No murmur heard. Pulmonary/Chest: Breath sounds normal. She has no wheezes. She has no rales. She exhibits no tenderness.  Abdominal: Soft. Bowel sounds are normal. She exhibits no distension. There is no tenderness. There is no rebound and no guarding.  Musculoskeletal: She exhibits edema and tenderness.       Diffuse tenderness to the right proximal humerus. Range of motion limited secondary to pain  Neurological: She is alert and oriented to person, place, and time. No cranial nerve deficit. Coordination normal.  Skin: Skin is warm and dry. No rash noted.  Psychiatric: She has a normal mood and affect.    ED Course  Procedures (including critical care time)  Labs Reviewed - No data to display No results found.   No diagnosis found.    MDM  Patient seen and examined in the room, status post fall. Patient states she was climbing up on her kitchen counters and fell and landed onto her right shoulder and right proximal humerus.        Deklyn Gibbon A. Patrica Duel, MD 04/17/11 1341  2:20 PM Results for orders placed during the hospital encounter of 04/04/11  COMPREHENSIVE METABOLIC PANEL      Component Value Range   Sodium 139  135 - 145 (mEq/L)   Potassium 2.2 (*) 3.5 - 5.1 (mEq/L)   Chloride 104  96 - 112 (mEq/L)   CO2 16 (*) 19 - 32 (mEq/L)   Glucose, Bld 94  70 - 99 (mg/dL)   BUN 7  6 - 23 (mg/dL)   Creatinine, Ser 0.45  0.50 - 1.10 (mg/dL)   Calcium 5.0 (*) 8.4 - 10.5 (mg/dL)   Total Protein 7.8  6.0 - 8.3 (g/dL)   Albumin 2.8 (*) 3.5 - 5.2 (g/dL)   AST 58 (*) 0 - 37 (U/L)   ALT 14   0 - 35 (U/L)   Alkaline Phosphatase 120 (*) 39 - 117 (U/L)   Total Bilirubin  0.2 (*) 0.3 - 1.2 (mg/dL)   GFR calc non Af Amer 65 (*) >90 (mL/min)   GFR calc Af Amer 75 (*) >90 (mL/min)  ETHANOL      Component Value Range   Alcohol, Ethyl (B) 163 (*) 0 - 11 (mg/dL)  MAGNESIUM      Component Value Range   Magnesium 0.7 (*) 1.5 - 2.5 (mg/dL)  BASIC METABOLIC PANEL      Component Value Range   Sodium 140  135 - 145 (mEq/L)   Potassium 2.5 (*) 3.5 - 5.1 (mEq/L)   Chloride 106  96 - 112 (mEq/L)   CO2 17 (*) 19 - 32 (mEq/L)   Glucose, Bld 106 (*) 70 - 99 (mg/dL)   BUN 7  6 - 23 (mg/dL)   Creatinine, Ser 4.09  0.50 - 1.10 (mg/dL)   Calcium 4.9 (*) 8.4 - 10.5 (mg/dL)   GFR calc non Af Amer 72 (*) >90 (mL/min)   GFR calc Af Amer 84 (*) >90 (mL/min)  MAGNESIUM      Component Value Range   Magnesium 1.5  1.5 - 2.5 (mg/dL)  CALCIUM, IONIZED      Component Value Range   Calcium, Ion 0.64 (*) 1.12 - 1.32 (mmol/L)  DRUGS OF ABUSE SCREEN W/O ALC, ROUTINE URINE      Component Value Range   Marijuana Metabolite NEGATIVE  Negative    Amphetamine Screen, Ur NEGATIVE  Negative    Barbiturate Quant, Ur NEGATIVE  Negative    Methadone NEGATIVE  Negative    Benzodiazepines. NEGATIVE  Negative    Phencyclidine (PCP) NEGATIVE  Negative    Cocaine Metabolites NEGATIVE  Negative    Opiate Screen, Urine NEGATIVE  Negative    Propoxyphene NEGATIVE  Negative    Creatinine,U 92.6    BASIC METABOLIC PANEL      Component Value Range   Sodium 141  135 - 145 (mEq/L)   Potassium 4.9  3.5 - 5.1 (mEq/L)   Chloride 112  96 - 112 (mEq/L)   CO2 17 (*) 19 - 32 (mEq/L)   Glucose, Bld 132 (*) 70 - 99 (mg/dL)   BUN 7  6 - 23 (mg/dL)   Creatinine, Ser 8.11  0.50 - 1.10 (mg/dL)   Calcium 5.5 (*) 8.4 - 10.5 (mg/dL)   GFR calc non Af Amer 79 (*) >90 (mL/min)   GFR calc Af Amer >90  >90 (mL/min)  MAGNESIUM      Component Value Range   Magnesium 1.7  1.5 - 2.5 (mg/dL)  BASIC METABOLIC PANEL      Component Value  Range   Sodium 138  135 - 145 (mEq/L)   Potassium 4.8  3.5 - 5.1 (mEq/L)   Chloride 111  96 - 112 (mEq/L)   CO2 13 (*) 19 - 32 (mEq/L)   Glucose, Bld 162 (*) 70 - 99 (mg/dL)   BUN 6  6 - 23 (mg/dL)   Creatinine, Ser 9.14  0.50 - 1.10 (mg/dL)   Calcium 6.0 (*) 8.4 - 10.5 (mg/dL)   GFR calc non Af Amer 75 (*) >90 (mL/min)   GFR calc Af Amer 87 (*) >90 (mL/min)  MAGNESIUM      Component Value Range   Magnesium 1.3 (*) 1.5 - 2.5 (mg/dL)  BASIC METABOLIC PANEL      Component Value Range   Sodium 135  135 - 145 (mEq/L)   Potassium 4.3  3.5 - 5.1 (mEq/L)   Chloride 109  96 - 112 (  mEq/L)   CO2 14 (*) 19 - 32 (mEq/L)   Glucose, Bld 203 (*) 70 - 99 (mg/dL)   BUN 5 (*) 6 - 23 (mg/dL)   Creatinine, Ser 4.09  0.50 - 1.10 (mg/dL)   Calcium 5.9 (*) 8.4 - 10.5 (mg/dL)   GFR calc non Af Amer 76 (*) >90 (mL/min)   GFR calc Af Amer 88 (*) >90 (mL/min)  MAGNESIUM      Component Value Range   Magnesium 2.0  1.5 - 2.5 (mg/dL)  CALCIUM, IONIZED      Component Value Range   Calcium, Ion 0.88 (*) 1.12 - 1.32 (mmol/L)  BASIC METABOLIC PANEL      Component Value Range   Sodium 137  135 - 145 (mEq/L)   Potassium 4.6  3.5 - 5.1 (mEq/L)   Chloride 111  96 - 112 (mEq/L)   CO2 16 (*) 19 - 32 (mEq/L)   Glucose, Bld 91  70 - 99 (mg/dL)   BUN 7  6 - 23 (mg/dL)   Creatinine, Ser 8.11  0.50 - 1.10 (mg/dL)   Calcium 6.2 (*) 8.4 - 10.5 (mg/dL)   GFR calc non Af Amer 68 (*) >90 (mL/min)   GFR calc Af Amer 78 (*) >90 (mL/min)  MAGNESIUM      Component Value Range   Magnesium 1.2 (*) 1.5 - 2.5 (mg/dL)  BASIC METABOLIC PANEL      Component Value Range   Sodium 137  135 - 145 (mEq/L)   Potassium 4.5  3.5 - 5.1 (mEq/L)   Chloride 110  96 - 112 (mEq/L)   CO2 14 (*) 19 - 32 (mEq/L)   Glucose, Bld 118 (*) 70 - 99 (mg/dL)   BUN 7  6 - 23 (mg/dL)   Creatinine, Ser 9.14  0.50 - 1.10 (mg/dL)   Calcium 6.9 (*) 8.4 - 10.5 (mg/dL)   GFR calc non Af Amer 73 (*) >90 (mL/min)   GFR calc Af Amer 85 (*) >90 (mL/min)    CALCIUM, IONIZED      Component Value Range   Calcium, Ion 1.05 (*) 1.12 - 1.32 (mmol/L)  BASIC METABOLIC PANEL      Component Value Range   Sodium 134 (*) 135 - 145 (mEq/L)   Potassium 4.2  3.5 - 5.1 (mEq/L)   Chloride 106  96 - 112 (mEq/L)   CO2 17 (*) 19 - 32 (mEq/L)   Glucose, Bld 120 (*) 70 - 99 (mg/dL)   BUN 8  6 - 23 (mg/dL)   Creatinine, Ser 7.82  0.50 - 1.10 (mg/dL)   Calcium 7.2 (*) 8.4 - 10.5 (mg/dL)   GFR calc non Af Amer 71 (*) >90 (mL/min)   GFR calc Af Amer 83 (*) >90 (mL/min)  MAGNESIUM      Component Value Range   Magnesium 1.7  1.5 - 2.5 (mg/dL)  BASIC METABOLIC PANEL      Component Value Range   Sodium 136  135 - 145 (mEq/L)   Potassium 4.2  3.5 - 5.1 (mEq/L)   Chloride 109  96 - 112 (mEq/L)   CO2 18 (*) 19 - 32 (mEq/L)   Glucose, Bld 102 (*) 70 - 99 (mg/dL)   BUN 9  6 - 23 (mg/dL)   Creatinine, Ser 9.56  0.50 - 1.10 (mg/dL)   Calcium 7.8 (*) 8.4 - 10.5 (mg/dL)   GFR calc non Af Amer 70 (*) >90 (mL/min)   GFR calc Af Amer 81 (*) >90 (mL/min)  MAGNESIUM  Component Value Range   Magnesium 2.0  1.5 - 2.5 (mg/dL)  CALCIUM, IONIZED      Component Value Range   Calcium, Ion 1.19  1.12 - 1.32 (mmol/L)   Mm Digital Screening  03/30/2011  DG SCREEN MAMMOGRAM BILATERAL Bilateral CC and MLO view(s) were taken. Technologist: Antonieta Pert  DIGITAL SCREENING MAMMOGRAM WITH CAD: The breast tissue is heterogeneously dense.  No masses or malignant type calcifications are  identified.  Compared with prior studies.  Images were processed with CAD.  IMPRESSION: No specific mammographic evidence of malignancy.  Next screening mammogram is recommended in one  year.  A result letter of this screening mammogram will be mailed directly to the patient.  ASSESSMENT: Negative - BI-RADS 1  Screening mammogram in 1 year. ,    Results for orders placed during the hospital encounter of 04/04/11  COMPREHENSIVE METABOLIC PANEL      Component Value Range   Sodium 139  135 - 145  (mEq/L)   Potassium 2.2 (*) 3.5 - 5.1 (mEq/L)   Chloride 104  96 - 112 (mEq/L)   CO2 16 (*) 19 - 32 (mEq/L)   Glucose, Bld 94  70 - 99 (mg/dL)   BUN 7  6 - 23 (mg/dL)   Creatinine, Ser 8.29  0.50 - 1.10 (mg/dL)   Calcium 5.0 (*) 8.4 - 10.5 (mg/dL)   Total Protein 7.8  6.0 - 8.3 (g/dL)   Albumin 2.8 (*) 3.5 - 5.2 (g/dL)   AST 58 (*) 0 - 37 (U/L)   ALT 14  0 - 35 (U/L)   Alkaline Phosphatase 120 (*) 39 - 117 (U/L)   Total Bilirubin 0.2 (*) 0.3 - 1.2 (mg/dL)   GFR calc non Af Amer 65 (*) >90 (mL/min)   GFR calc Af Amer 75 (*) >90 (mL/min)  ETHANOL      Component Value Range   Alcohol, Ethyl (B) 163 (*) 0 - 11 (mg/dL)  MAGNESIUM      Component Value Range   Magnesium 0.7 (*) 1.5 - 2.5 (mg/dL)  BASIC METABOLIC PANEL      Component Value Range   Sodium 140  135 - 145 (mEq/L)   Potassium 2.5 (*) 3.5 - 5.1 (mEq/L)   Chloride 106  96 - 112 (mEq/L)   CO2 17 (*) 19 - 32 (mEq/L)   Glucose, Bld 106 (*) 70 - 99 (mg/dL)   BUN 7  6 - 23 (mg/dL)   Creatinine, Ser 5.62  0.50 - 1.10 (mg/dL)   Calcium 4.9 (*) 8.4 - 10.5 (mg/dL)   GFR calc non Af Amer 72 (*) >90 (mL/min)   GFR calc Af Amer 84 (*) >90 (mL/min)  MAGNESIUM      Component Value Range   Magnesium 1.5  1.5 - 2.5 (mg/dL)  CALCIUM, IONIZED      Component Value Range   Calcium, Ion 0.64 (*) 1.12 - 1.32 (mmol/L)  DRUGS OF ABUSE SCREEN W/O ALC, ROUTINE URINE      Component Value Range   Marijuana Metabolite NEGATIVE  Negative    Amphetamine Screen, Ur NEGATIVE  Negative    Barbiturate Quant, Ur NEGATIVE  Negative    Methadone NEGATIVE  Negative    Benzodiazepines. NEGATIVE  Negative    Phencyclidine (PCP) NEGATIVE  Negative    Cocaine Metabolites NEGATIVE  Negative    Opiate Screen, Urine NEGATIVE  Negative    Propoxyphene NEGATIVE  Negative    Creatinine,U 92.6    BASIC METABOLIC PANEL  Component Value Range   Sodium 141  135 - 145 (mEq/L)   Potassium 4.9  3.5 - 5.1 (mEq/L)   Chloride 112  96 - 112 (mEq/L)   CO2 17 (*)  19 - 32 (mEq/L)   Glucose, Bld 132 (*) 70 - 99 (mg/dL)   BUN 7  6 - 23 (mg/dL)   Creatinine, Ser 6.96  0.50 - 1.10 (mg/dL)   Calcium 5.5 (*) 8.4 - 10.5 (mg/dL)   GFR calc non Af Amer 79 (*) >90 (mL/min)   GFR calc Af Amer >90  >90 (mL/min)  MAGNESIUM      Component Value Range   Magnesium 1.7  1.5 - 2.5 (mg/dL)  BASIC METABOLIC PANEL      Component Value Range   Sodium 138  135 - 145 (mEq/L)   Potassium 4.8  3.5 - 5.1 (mEq/L)   Chloride 111  96 - 112 (mEq/L)   CO2 13 (*) 19 - 32 (mEq/L)   Glucose, Bld 162 (*) 70 - 99 (mg/dL)   BUN 6  6 - 23 (mg/dL)   Creatinine, Ser 2.95  0.50 - 1.10 (mg/dL)   Calcium 6.0 (*) 8.4 - 10.5 (mg/dL)   GFR calc non Af Amer 75 (*) >90 (mL/min)   GFR calc Af Amer 87 (*) >90 (mL/min)  MAGNESIUM      Component Value Range   Magnesium 1.3 (*) 1.5 - 2.5 (mg/dL)  BASIC METABOLIC PANEL      Component Value Range   Sodium 135  135 - 145 (mEq/L)   Potassium 4.3  3.5 - 5.1 (mEq/L)   Chloride 109  96 - 112 (mEq/L)   CO2 14 (*) 19 - 32 (mEq/L)   Glucose, Bld 203 (*) 70 - 99 (mg/dL)   BUN 5 (*) 6 - 23 (mg/dL)   Creatinine, Ser 2.84  0.50 - 1.10 (mg/dL)   Calcium 5.9 (*) 8.4 - 10.5 (mg/dL)   GFR calc non Af Amer 76 (*) >90 (mL/min)   GFR calc Af Amer 88 (*) >90 (mL/min)  MAGNESIUM      Component Value Range   Magnesium 2.0  1.5 - 2.5 (mg/dL)  CALCIUM, IONIZED      Component Value Range   Calcium, Ion 0.88 (*) 1.12 - 1.32 (mmol/L)  BASIC METABOLIC PANEL      Component Value Range   Sodium 137  135 - 145 (mEq/L)   Potassium 4.6  3.5 - 5.1 (mEq/L)   Chloride 111  96 - 112 (mEq/L)   CO2 16 (*) 19 - 32 (mEq/L)   Glucose, Bld 91  70 - 99 (mg/dL)   BUN 7  6 - 23 (mg/dL)   Creatinine, Ser 1.32  0.50 - 1.10 (mg/dL)   Calcium 6.2 (*) 8.4 - 10.5 (mg/dL)   GFR calc non Af Amer 68 (*) >90 (mL/min)   GFR calc Af Amer 78 (*) >90 (mL/min)  MAGNESIUM      Component Value Range   Magnesium 1.2 (*) 1.5 - 2.5 (mg/dL)  BASIC METABOLIC PANEL      Component Value  Range   Sodium 137  135 - 145 (mEq/L)   Potassium 4.5  3.5 - 5.1 (mEq/L)   Chloride 110  96 - 112 (mEq/L)   CO2 14 (*) 19 - 32 (mEq/L)   Glucose, Bld 118 (*) 70 - 99 (mg/dL)   BUN 7  6 - 23 (mg/dL)   Creatinine, Ser 4.40  0.50 - 1.10 (mg/dL)   Calcium 6.9 (*)  8.4 - 10.5 (mg/dL)   GFR calc non Af Amer 73 (*) >90 (mL/min)   GFR calc Af Amer 85 (*) >90 (mL/min)  CALCIUM, IONIZED      Component Value Range   Calcium, Ion 1.05 (*) 1.12 - 1.32 (mmol/L)  BASIC METABOLIC PANEL      Component Value Range   Sodium 134 (*) 135 - 145 (mEq/L)   Potassium 4.2  3.5 - 5.1 (mEq/L)   Chloride 106  96 - 112 (mEq/L)   CO2 17 (*) 19 - 32 (mEq/L)   Glucose, Bld 120 (*) 70 - 99 (mg/dL)   BUN 8  6 - 23 (mg/dL)   Creatinine, Ser 6.57  0.50 - 1.10 (mg/dL)   Calcium 7.2 (*) 8.4 - 10.5 (mg/dL)   GFR calc non Af Amer 71 (*) >90 (mL/min)   GFR calc Af Amer 83 (*) >90 (mL/min)  MAGNESIUM      Component Value Range   Magnesium 1.7  1.5 - 2.5 (mg/dL)  BASIC METABOLIC PANEL      Component Value Range   Sodium 136  135 - 145 (mEq/L)   Potassium 4.2  3.5 - 5.1 (mEq/L)   Chloride 109  96 - 112 (mEq/L)   CO2 18 (*) 19 - 32 (mEq/L)   Glucose, Bld 102 (*) 70 - 99 (mg/dL)   BUN 9  6 - 23 (mg/dL)   Creatinine, Ser 8.46  0.50 - 1.10 (mg/dL)   Calcium 7.8 (*) 8.4 - 10.5 (mg/dL)   GFR calc non Af Amer 70 (*) >90 (mL/min)   GFR calc Af Amer 81 (*) >90 (mL/min)  MAGNESIUM      Component Value Range   Magnesium 2.0  1.5 - 2.5 (mg/dL)  CALCIUM, IONIZED      Component Value Range   Calcium, Ion 1.19  1.12 - 1.32 (mmol/L)   Dg Humerus Right  04/17/2011  *RADIOLOGY REPORT*  Clinical Data: 55 year old female with right shoulder pain following fall.  RIGHT HUMERUS - 2+ VIEW  Comparison: None  Findings: A transverse fracture through the humeral neck is identified with 2 mm medial displacement. No significant angulation is identified. There is no evidence of subluxation or dislocation. The visualized right hemithorax is  unremarkable.  IMPRESSION: Transverse humeral neck fracture with minimal displacement.  Original Report Authenticated By: Rosendo Gros, M.D.   Mm Digital Screening  03/30/2011  DG SCREEN MAMMOGRAM BILATERAL Bilateral CC and MLO view(s) were taken. Technologist: Antonieta Pert  DIGITAL SCREENING MAMMOGRAM WITH CAD: The breast tissue is heterogeneously dense.  No masses or malignant type calcifications are  identified.  Compared with prior studies.  Images were processed with CAD.  IMPRESSION: No specific mammographic evidence of malignancy.  Next screening mammogram is recommended in one  year.  A result letter of this screening mammogram will be mailed directly to the patient.  ASSESSMENT: Negative - BI-RADS 1  Screening mammogram in 1 year. ,    3:26 PM With the orthopedic surgeon, who also agrees with outpatient management    Easton Fetty A. Patrica Duel, MD 04/17/11 1527

## 2011-04-17 NOTE — ED Notes (Signed)
Sling placed by ortho, neurovascular intact pre and post placement.

## 2011-04-17 NOTE — ED Notes (Signed)
Per EMS:  Pt here s/p fall.  Pt reports that her knee gave and she fell landing on right side.  Pt c/o right shoulder pain upon arrival.

## 2011-04-21 ENCOUNTER — Telehealth: Payer: Self-pay | Admitting: *Deleted

## 2011-04-21 DIAGNOSIS — S42293A Other displaced fracture of upper end of unspecified humerus, initial encounter for closed fracture: Secondary | ICD-10-CM

## 2011-04-21 NOTE — Telephone Encounter (Signed)
Receive call from [name] [name] nurse, requesting an order for Physical Therapy for [name] [name]. States pt felled few days ago and went to the ED (11/12); has humeral neck fx. States she has some right -sided weakness and home PT may help. Please advise.  Thanks

## 2011-04-21 NOTE — Telephone Encounter (Signed)
Pls give verbal order fore PT. Pls also make certain that pt is F/U with outpt ortho as the ER recommended. Thanks

## 2011-04-21 NOTE — Telephone Encounter (Signed)
I put in order for home health for PT. Are you able to print it off?

## 2011-04-21 NOTE — Telephone Encounter (Signed)
Yes, I can print the referral.  Thanks

## 2011-05-08 ENCOUNTER — Telehealth: Payer: Self-pay | Admitting: *Deleted

## 2011-05-08 NOTE — Telephone Encounter (Signed)
Pt calls and has broken arm, wants pain med, appt 12/4 at 1415, dr patel

## 2011-05-09 ENCOUNTER — Ambulatory Visit (INDEPENDENT_AMBULATORY_CARE_PROVIDER_SITE_OTHER): Payer: PRIVATE HEALTH INSURANCE | Admitting: Internal Medicine

## 2011-05-09 VITALS — BP 143/100 | HR 106 | Temp 99.1°F | Wt 86.0 lb

## 2011-05-09 DIAGNOSIS — D518 Other vitamin B12 deficiency anemias: Secondary | ICD-10-CM

## 2011-05-09 DIAGNOSIS — S42213A Unspecified displaced fracture of surgical neck of unspecified humerus, initial encounter for closed fracture: Secondary | ICD-10-CM

## 2011-05-09 DIAGNOSIS — I10 Essential (primary) hypertension: Secondary | ICD-10-CM

## 2011-05-09 DIAGNOSIS — M25551 Pain in right hip: Secondary | ICD-10-CM

## 2011-05-09 DIAGNOSIS — M25559 Pain in unspecified hip: Secondary | ICD-10-CM

## 2011-05-09 MED ORDER — DICLOFENAC SODIUM 1 % TD GEL: 1.0000 "application " | Freq: Four times a day (QID) | TRANSDERMAL | Status: DC

## 2011-05-09 MED ORDER — CYANOCOBALAMIN 1000 MCG/ML IJ SOLN
1000.0000 ug | Freq: Once | INTRAMUSCULAR | Status: AC
  Administered 2011-05-09: 1000 ug via INTRAMUSCULAR

## 2011-05-09 MED ORDER — OXYCODONE-ACETAMINOPHEN 5-325 MG PO TABS: 1.0000 | ORAL_TABLET | Freq: Four times a day (QID) | ORAL | Status: AC | PRN

## 2011-05-09 NOTE — Assessment & Plan Note (Signed)
She'll get a vitamin B 12 IM injection today.

## 2011-05-09 NOTE — Assessment & Plan Note (Addendum)
Although her recent and past UDS was positive for cocaine alcohol- and also last UDS as described in history of present illness was negative for oxycodone which she was supposed to take- after discussing with Dr. Phillips Odor in detail about the case- we will give her 20 pills of Percocet 5/325 without any refills and we'll recheck UDS today. We will see her back in 2 weeks to reassess the pain and discuss the pain medicine contract again depending on the UDS. Encouraged her to followup with orthopedic for her fracture and also take ibuprofen 400 mg 3 times a day along with Percocet for pain management.

## 2011-05-09 NOTE — Assessment & Plan Note (Signed)
Lab Results  Component Value Date   NA 136 04/07/2011   K 4.2 04/07/2011   CL 109 04/07/2011   CO2 18* 04/07/2011   BUN 9 04/07/2011   CREATININE 0.91 04/07/2011   CREATININE 1.16* 04/03/2011    BP Readings from Last 3 Encounters:  05/09/11 143/100  04/17/11 112/70  04/10/11 129/93    Assessment: Hypertension control:  mildly elevated  Progress toward goals:  deteriorated Barriers to meeting goals:  lack of understanding of disease management  Plan: Hypertension treatment:  continue current medications

## 2011-05-09 NOTE — Progress Notes (Signed)
  Subjective:    [name], female    DOB: December 15, 1955, 55 y.o.   MRN: [id number]  HPI [name] [name] as a 55 year woman with past with history of present is abuse, chronic pain, hypertension who comes to the clinic for prescription refills. She was diagnosed with humeral neck fracture which was mildly undisplaced on April 17 2011 in ER and was sent home with outpatient orthopedic management. She comes today for prescription refill for her pain medications. She was using the pain medications from last months prescription. Of note- her last UDS on 04/10/2011 obtained by Dr. Maisie Fus in [facility] showed no oxycodone which she was supposed to take, and was positive for cocaine and alcohol.  I discussed the result with her but she says that she doesn't remember doing any urine during the last visit and we got a sample from the restroom when she might have got for urination. She also denies any current cocaine or alcohol use since hospital discharge.  She denies any fever, chills, nausea vomiting, abdominal pain, chest pain, shortness of breath.    Review of Systems    as per history of present illness, all other systems reviewed and negative. Objective:   Physical Exam  Vitals: Reviewed General: Mildly anxious and in pain. HEENT: PERRL, EOMI, no scleral icterus Cardiac: RRR, no rubs, murmurs or gallops Pulm: clear to auscultation bilaterally, moving normal volumes of air Abd: soft, nontender, nondistended, BS present Ext: Right upper extremity in sling. Severe Tenderness to palpation of right shoulder. Neuro: alert and oriented X3.     Assessment & Plan:

## 2011-05-09 NOTE — Progress Notes (Signed)
Addended by: Hassan Buckler on: 05/09/2011 03:49 PM   Modules accepted: Orders

## 2011-05-09 NOTE — Patient Instructions (Addendum)
Please make followup appointment in 2 weeks. At that time we will reevaluate the pain and discuss about the pain contract again. Meanwhile we will give 20 Percocet tablets to you for your fracture. But the pain medications would not be for longer duration. Also use ibuprofen 200 mg 2 tablets 3 times a day for the pain. Take all your medications regularly.

## 2011-05-11 LAB — PRESCRIPTION ABUSE MONITORING 15P, URINE
Amphetamine/Meth: NEGATIVE NG/ML
Barbiturate Screen, Urine: NEGATIVE NG/ML
Benzodiazepine Screen, Urine: NEGATIVE NG/ML
Carisoprodol, Urine: NEGATIVE NG/ML
Cocaine Metabolites: POSITIVE NG/ML — ABNORMAL HIGH
Fentanyl, Ur: NEGATIVE NG/ML
Meperidine, Ur: NEGATIVE NG/ML
Propoxyphene: NEGATIVE NG/ML
Zolpidem, Urine: NEGATIVE NG/ML

## 2011-05-11 LAB — COCAINE METABOLITE (GC/LC/MS), URINE: Benzoylecgonine GC/MS Conf: 2350 NG/ML — ABNORMAL HIGH

## 2011-06-28 ENCOUNTER — Ambulatory Visit (INDEPENDENT_AMBULATORY_CARE_PROVIDER_SITE_OTHER): Payer: PRIVATE HEALTH INSURANCE | Admitting: Internal Medicine

## 2011-06-28 ENCOUNTER — Encounter: Payer: Self-pay | Admitting: Internal Medicine

## 2011-06-28 VITALS — BP 110/64 | HR 92 | Temp 96.8°F | Ht 61.0 in | Wt 85.9 lb

## 2011-06-28 DIAGNOSIS — D518 Other vitamin B12 deficiency anemias: Secondary | ICD-10-CM

## 2011-06-28 DIAGNOSIS — Z0000 Encounter for general adult medical examination without abnormal findings: Secondary | ICD-10-CM

## 2011-06-28 DIAGNOSIS — I10 Essential (primary) hypertension: Secondary | ICD-10-CM

## 2011-06-28 DIAGNOSIS — R6251 Failure to thrive (child): Secondary | ICD-10-CM

## 2011-06-28 DIAGNOSIS — F3289 Other specified depressive episodes: Secondary | ICD-10-CM

## 2011-06-28 DIAGNOSIS — G894 Chronic pain syndrome: Secondary | ICD-10-CM

## 2011-06-28 DIAGNOSIS — E559 Vitamin D deficiency, unspecified: Secondary | ICD-10-CM

## 2011-06-28 DIAGNOSIS — F329 Major depressive disorder, single episode, unspecified: Secondary | ICD-10-CM

## 2011-06-28 DIAGNOSIS — G8929 Other chronic pain: Secondary | ICD-10-CM

## 2011-06-28 DIAGNOSIS — M25551 Pain in right hip: Secondary | ICD-10-CM

## 2011-06-28 DIAGNOSIS — R627 Adult failure to thrive: Secondary | ICD-10-CM

## 2011-06-28 LAB — CALCIUM, IONIZED: Calcium, Ion: 0.98 mmol/L — ABNORMAL LOW (ref 1.12–1.32)

## 2011-06-28 MED ORDER — AMLODIPINE BESYLATE 10 MG PO TABS: 10.0000 mg | ORAL_TABLET | Freq: Every day | ORAL | Status: DC

## 2011-06-28 MED ORDER — DICLOFENAC SODIUM 1 % TD GEL: 1.0000 "application " | Freq: Four times a day (QID) | TRANSDERMAL | Status: DC

## 2011-06-28 MED ORDER — IBUPROFEN 100 MG PO TABS: 200.0000 mg | ORAL_TABLET | Freq: Two times a day (BID) | ORAL | Status: DC | PRN

## 2011-06-28 MED ORDER — GABAPENTIN 300 MG PO CAPS
300.0000 mg | ORAL_CAPSULE | Freq: Three times a day (TID) | ORAL | Status: DC
Start: 2011-06-28 — End: 2011-08-21

## 2011-06-28 MED ORDER — HYDROCHLOROTHIAZIDE 25 MG PO TABS: 25.0000 mg | ORAL_TABLET | Freq: Every day | ORAL | Status: DC

## 2011-06-28 NOTE — Assessment & Plan Note (Signed)
The patient has history of vitamin D deficiency which was checked in 2009. Currently not on any supplements. Patient has increased risk considering pancreatic insufficiency and prednisone use. I will obtain vitamin D level today and supplement accordingly. Patient may need a DEXA scan. At this point I am not sure if patient would be a candidate for bisphosphonate therapy. I will review recommendation and possibly obtain a DEXA scan.

## 2011-06-28 NOTE — Assessment & Plan Note (Signed)
I denied  today refill for Opoids  concentrating UDS positive x2 for cocaine although patient denies using it. Today patient has no acute process for which I could prescribe her pain medication. I refilled her ibuprofen and increase her gabapentin from 100 mg 3 times a day to 300 mg 3 times a day which could be further increased. At this point I do not recommend to refill her pain medication except in acute situation.

## 2011-06-28 NOTE — Assessment & Plan Note (Signed)
Mean need to be addressed patient's depression medication during the next office visit and possible referred to psychiatry. The patient may benefit from Remeron to increase her appetite.

## 2011-06-28 NOTE — Progress Notes (Signed)
Addended by: Bufford Spikes on: 06/28/2011 10:43 AM   Modules accepted: Orders

## 2011-06-28 NOTE — Assessment & Plan Note (Signed)
Will check magnesium level and replete accordingly

## 2011-06-28 NOTE — Progress Notes (Signed)
Subjective:   [name] female   DOB: December 05, 1955 56 y.o.   MRN: [id number]  HPI: [name] is a 56 y.o.  female with past medical history significant as outlined below who presented to the clinic for pain medication refill. Patient was evaluated in December of 2012 for pain and was given  20 tablets of Percocet 5/325 mg since she suffers from a  humerus fracture in November of 2012. Patient was noted to have UDS 04/10/2011 positive for cocaine and negative for oxycodone which she was suppose to take. Patient reported that time that there was something wrong with the UDS therefore another UDS was obtained on 05/09/2011 which was again positive for cocaine. Today patient again denies using cocaine underlined she would never do that since she is on probation. Patient followed up with orthopedic for her humerus fracture as recommended. She needs to followup in couple of weeks. Today patient is complaining about pain in her shoulder and her back which is chronic for her. She further reports shooting pain into her right leg in a patient some weakness. Denies any fevers or chills, rashes or changes in urinary or bowel habits.    Past Medical History  Diagnosis Date  . Anemia, B12 deficiency   . History of acute pancreatitis   . Right knee pain     No recent imaging on chart  . 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 Dr. Su Hilt, [facility]  . Hypertriglyceridemia   . GERD (gastroesophageal reflux disease)   . Vitamin D deficiency   . Subdural hematoma 02/2008    Likely 2/2 trauma from seizure from EtOH withdrawal, chronic in nature, sees Dr. Robyne Askew. Most recent CT head 10/2009 showing stable but persistent hematoma without mass effect.  . History of seizure disorder     Likely 2/2 alcohol abuse  . Hypocalcemia   . Hypomagnesemia   . Failure to thrive     Unclear etiology  . HTN (hypertension)   .  Thrombocytopenia   . Anemia, macrocytic   . Hepatomegaly     On exam  . Alcohol abuse    Current Outpatient Prescriptions  Medication Sig Dispense Refill  . amLODipine (NORVASC) 10 MG tablet Take 1 tablet (10 mg total) by mouth daily.  30 tablet  2  . amylase-lipase-protease (PANGESTYME EC) 20-4.5-25 MU per capsule Take by mouth 3 (three) times daily with meals.       Marland Kitchen buPROPion (WELLBUTRIN) 100 MG tablet Take 100 mg by mouth 3 (three) times daily.        . diclofenac sodium (VOLTAREN) 1 % GEL Apply 1 application topically 4 (four) times daily.  1 Tube  2  . FLUoxetine (PROZAC) 10 MG capsule Take 10 mg by mouth daily.        Marland Kitchen gabapentin (NEURONTIN) 300 MG capsule Take 1 capsule (300 mg total) by mouth 3 (three) times daily.  90 capsule  3  . hydrochlorothiazide (HYDRODIURIL) 25 MG tablet Take 1 tablet (25 mg total) by mouth daily.  30 tablet  2  . ibuprofen (ADVIL,MOTRIN) 100 MG tablet Take 2 tablets (200 mg total) by mouth 2 (two) times daily as needed. For pain. Take with food.  60 tablet  0  . predniSONE (DELTASONE) 10 MG tablet Take 10 mg by mouth daily. Takes 5 tablets the first day, then 4 tabs day 2, then 3 tabs day 3, then 2 tabs  day 4, then 1 tab day 5 and continue 1 tab daily thereafter.   Started on 11/4.      Marland Kitchen promethazine (PHENERGAN) 12.5 MG tablet Take 12.5 mg by mouth 4 (four) times daily as needed. For nausea       . thiamine (VITAMIN B-1) 100 MG tablet Take 100 mg by mouth 2 (two) times daily.         Current Facility-Administered Medications  Medication Dose Route Frequency Provider Last Rate Last Dose  . cyanocobalamin ((VITAMIN B-12)) injection 1,000 mcg  1,000 mcg Intramuscular Q30 days Deatra Robinson, MD   1,000 mcg at 06/28/11 1610  . DISCONTD: potassium chloride (K-DUR,KLOR-CON) CR tablet 40 mEq  40 mEq Oral Once Deatra Robinson, MD       Family History  Problem Relation Age of Onset  . Cancer Mother     Died from stomach cancer and "flesh eating rash  . Heart  failure Father     Died in 27s from an MI  . Alcohol abuse Sister     Twin sister drinks a lot, as did both her parents and brothers  . Stroke Brother     Has 7 brothers, 1 with CVA   History   Social History  . Marital Status: Divorced    Spouse Name: N/A    Number of Children: N/A  . Years of Education: N/A   Social History Main Topics  . Smoking status: Former Smoker    Types: Cigarettes    Quit date: 09/20/2010  . Smokeless tobacco: None  . Alcohol Use: No     In treatment at the [facility]  . Drug Use: No  . Sexually Active: None   Other Topics Concern  . None   Social History Narrative   Lives with her significant other and 2 grandchildren.Has 7 brothers and 4 sisters, 1 twin sister.Unemployed, worked in [employer]. Abuses alcoholNo drug use.   Review of Systems: Constitutional: Denies fever, chills, diaphoresis, appetite change and fatigue.  Respiratory: Denies SOB, DOE, cough, chest tightness,  and wheezing.   Cardiovascular: Denies chest pain, palpitations and leg swelling.  Gastrointestinal: Denies nausea, vomiting, abdominal pain, diarrhea, constipation, Genitourinary: Denies dysuria, urgency, frequency, hematuria, flank pain and difficulty urinating.  Musculoskeletal: Noted myalgias, back pain, joint swelling, arthralgias and gait problem.  Skin: Denies pallor, rash and wound.  Neurological: Denies dizziness, light-headedness, numbness and headaches.    Objective:  Physical Exam: Filed Vitals:   06/28/11 0856 06/28/11 0941  BP: 125/91 110/64  Pulse: 92   Temp: 96.8 F (36 C)   TempSrc: Oral   Height: 5\' 1"  (1.549 m)   Weight: 85 lb 14.4 oz (38.964 kg)   SpO2: 100%    Constitutional: Vital signs reviewed.  Patient is a well-developed and well-nourished  in no acute distress and cooperative with exam. Alert and oriented x3.  Neck: Supple,  Cardiovascular: RRR, S1 normal, S2 normal, no MRG, pulses symmetric and intact bilaterally Pulmonary/Chest:  CTAB, no wheezes, rales, or rhonchi Abdominal: Soft. Non-tender, non-distended, bowel sounds are normal,  Musculoskeletal: No joint deformities, erythema, or stiffness, ROM full . Spine: mild tenderness on palpation paraspinal. Straight leg raise test negative Neurological: A&O x3, Strenght is normal and symmetric bilaterally, no focal motor deficit, sensory intact to light touch bilaterally.  Skin: Warm, dry and intact. No rash, cyanosis, or clubbing.

## 2011-06-28 NOTE — Assessment & Plan Note (Signed)
Patient received her vitamin B12 shots today.

## 2011-06-28 NOTE — Assessment & Plan Note (Signed)
Blood pressure well controlled. Continue current regimen BP Readings from Last 3 Encounters:  06/28/11 110/64  05/09/11 143/100  04/17/11 112/70

## 2011-06-28 NOTE — Assessment & Plan Note (Signed)
Patient's weight has been stable but her BMI 16. Likely multifactorial in the setting of history of alcohol abuse, cocaine abuse, chronic pancreatic insufficiency. Patient reports taking Ensure not on a regular basis. Recheck electrolytes for possible supplementation.

## 2011-06-29 ENCOUNTER — Other Ambulatory Visit: Payer: Self-pay | Admitting: Internal Medicine

## 2011-06-29 DIAGNOSIS — E559 Vitamin D deficiency, unspecified: Secondary | ICD-10-CM

## 2011-06-29 DIAGNOSIS — E878 Other disorders of electrolyte and fluid balance, not elsewhere classified: Secondary | ICD-10-CM | POA: Insufficient documentation

## 2011-06-29 LAB — BASIC METABOLIC PANEL
BUN: 9 mg/dL (ref 6–23)
CO2: 19 mEq/L (ref 19–32)
Calcium: 7.2 mg/dL — ABNORMAL LOW (ref 8.4–10.5)
Chloride: 104 mEq/L (ref 96–112)
Sodium: 134 mEq/L — ABNORMAL LOW (ref 135–145)

## 2011-06-29 LAB — MAGNESIUM: Magnesium: 0.9 mg/dL — ABNORMAL LOW (ref 1.5–2.5)

## 2011-06-29 LAB — VITAMIN D 25 HYDROXY (VIT D DEFICIENCY, FRACTURES): Vit D, 25-Hydroxy: <10 ng/mL — ABNORMAL LOW (ref 30–89)

## 2011-06-29 MED ORDER — MAGNESIUM OXIDE 400 MG PO TABS: 400.0000 mg | ORAL_TABLET | Freq: Two times a day (BID) | ORAL | Status: DC

## 2011-06-29 MED ORDER — CALCIUM GLUCONATE 500 MG PO TABS: 500.0000 mg | ORAL_TABLET | Freq: Every day | ORAL | Status: DC

## 2011-06-29 MED ORDER — ERGOCALCIFEROL 1.25 MG (50000 UT) PO CAPS: 50000.0000 [IU] | ORAL_CAPSULE | ORAL | Status: DC

## 2011-06-29 NOTE — Assessment & Plan Note (Signed)
Vitamin D level of 11 likely due malabsorption and decreased po intake.  I will prescribe Vitamin D 50.000 units for 4-6 week. Needs a repeat check of level in 3-4 month.

## 2011-07-06 NOTE — Discharge Summary (Signed)
[name], [name] [name]             ACCOUNT NO.:  [id number]  MEDICAL RECORD NO.:  [id number]  LOCATION:                                 FACILITY:  PHYSICIAN:  [name] [name], M.D.DATE OF BIRTH:  [birth year]  DATE OF ADMISSION:  04/04/2011 DATE OF DISCHARGE:  04/07/2011                              DISCHARGE SUMMARY   DISCHARGE DIAGNOSES: 1. Hypomagnesemia. 2. Hypokalemia. 3. Hypocalcemia. 4. Chronic alcohol abuse with mulitple relapses  a) prior delirium tremens, previously requiring intubation about 15 years ago 5. Chronic pancreatitis. 6. Malnutrition.  DISCHARGE MEDICATIONS: 1. Pancrease/Creon capsules 1 capsule by mouth 3 times a day with     meals. 2. Magnesium chloride 64 mg 2 tabs by mouth twice daily. 3. Tylenol 325 mg 2 tablets by mouth every 6 hours as needed for pain. 4. Amlodipine 10 mg 1 tablet by mouth daily. 5. Calcium carbonate 1 tablet by mouth 3 times daily. 6. Folic acid 1 mg 1 tablet by mouth daily. 7. Omeprazole 40 mg 1 capsule by mouth daily. 8. Oxycodone and acetaminophen 5/325 mg 1 tablet by mouth every 6     hours as needed for pain. 9. Potassium chloride 20 mEq 1 tablet by mouth twice daily. 10.Thiamine 100 mg 1 tablet by mouth daily. 11.Vitamin D2 - 50000 units 1 tablet by mouth weekly on Thursdays.  Stop taking magnesium oxide 400 mg 2 tablets by mouth twice daily due to the side effect of nausea and difficulty swallowing the pills.  This medication is replaced by magnesium chloride.  DISPOSITION AND FOLLOWUP:  The patient was discharged in stable and improved condition on April 07, 2011, with normalization of the patient's magnesium, potassium, and calcium levels and resolution of the patient's symptoms of numbness and tingling.  The patient will follow up at the Outpatient Clinic on April 10, 2011, at 10:00 a.m. with Dr. Maisie [name], at which point Dr. Maisie [name] will check a BMET, magnesium, and ionized calcium to ensure that the patient's  magnesium, potassium, and calcium levels have increased back to the normal values.  He will also ask the patient if she has resumed taking her supplements of potassium, calcium, and magnesium and if her new magnesium supplements have fewer side effects.  Dr.Thomas will also ask the patient if she has been successful with alcohol cessation.  PROCEDURES PERFORMED:  EKG initially showed QT prolongation, but after repletion of electrolytes, QT prolongation resolved.  CONSULTATIONS:  None.  ADMITTING HISTORY AND PHYSICAL:  The patient is a 56 year old woman with a history of alcoholism as well as prior hypomagnesemia, prior hypocalcemia, vitamin B12 deficiency anemia presenting with a 2-week history of whole body tingling and numbness.  The patient notes a 2-week history of progressively worsening constant tingling in her feet, legs, hands, arms, and face along with occasional accompanying numbness in these areas.  She also notes occasional chills during this time period but no fevers.  She notes dizziness for the last 3 weeks, presenting as feeling as if the room was spinning, worse when standing from a seated or supine position, relieved by sitting down.  She also notes a 2-day history of nausea and vomiting, occurring twice a day, typically in the  morning, typically when standing from a seated or supine position.  She notes that she has been drinking about 2 bottles of wine per day, and that she has only been drinking a few glasses of water per day.  She believes she has been eating a full diet recently and drinking Ensure once a day.  She notes that she remembers to take her vitamin supplements 4-5 days a week and she takes her magnesium supplementation less often due to a side effect of nausea, and she has not yet started taking her potassium supplementation as prescribed on March 13, 2011. The patient presented to the Outpatient Clinic on the day prior to admission with these  complaints, and labs were drawn revealing a potassium of 2.7 and a magnesium of 0.7.  PHYSICAL EXAMINATION:  VITAL SIGNS:  Temperature 98.1, blood pressure 109/78, heart rate 103, respirations 20, O2 saturation 98% on room air. GENERAL:  Alert, appears malnourished, cooperative and in no apparent distress. HEENT:  Pupils equal, round, and reactive to light.  Vision grossly intact.  Oropharynx clear and nonerythematous. NECK:  Supple.  No lymphadenopathy.  No JVD. LUNGS:  Clear to auscultation bilaterally.  Normal work of respiration. No wheezes, rales, or rhonchi.   HEART:  Regular rate and rhythm.  No murmurs, gallops, or rubs. ABDOMEN:  Soft, nontender, nondistended.  Normal bowel sounds. EXTREMITIES:  No cyanosis, clubbing, or edema. NEUROLOGIC:  Alert and oriented x3.  Cranial nerves II through XII intact.  Strength 5/5 in all 4 extremities.  Sensation intact to light touch throughout.  Reflexes 1+ throughout.  Toes neutral.  ADMITTING LABORATORY DATA:  Sodium 140, potassium 2.7, chloride 106, bicarb 17, BUN 5, creatinine 1.16, glucose 127, calcium less than 5, magnesium 0.7, ionized calcium 0.64.  Urine drug screen negative. Alcohol 163.  HOSPITAL COURSE: 1. Hypomagnesemia/hypokalemia/hypocalcemia.  The patient presented to     the hospital with deficiencies in magnesium, potassium, and     calcium.  The patient has previously presented with similar     electrolyte abnormalities requiring repletion and chronically has     lower levels of these electrolytes.  The patient's electrolyte deficiencies likely result from a     combination of chronic alcohol use, chronic pancreatitis producing     malabsorption and only intermittent compliance with the patient's     vitamin supplementation.  The patient's electrolytes were slowly     repleted over 2 days, and the patient's symptoms of     numbness and tingling had resolved by the morning of the 2nd day of     admission.  The patient  was initially found to have mild     prolongation of QT on EKG, but this prolongation resolved after     repletion of electrolytes.  The patient notes a barrier to taking     her magnesium supplementation ( large and difficult to swallow and produce significant     nausea).  The patient was switched from magnesium oxide to magnesium     chloride in hopes of smaller side effect profile and greater ease     of taking this medication.  The patient's magnesium chloride dose     is significantly less than her previous magnesium oxide dose but     will likely be equivalent if the patient is able to actually take     this medication daily.  The dose of this medication can be     increased slowly as needed in the outpatient setting.  2. Alcohol abuse.  The patient has a long-standing history of alcohol     abuse with prior DTs requiring intubation about 15 years ago.  The     patient has quit alcohol multiple times, but has always relapsed     back into alcohol use.  The patient was amenable to discussing     alcohol cessation during this admission and had an in depth     discussion with the inpatient social worker, at which point she     agreed to try alcohol cessation again.  The patient was placed on     the CIWA protocol on admission with her last drink on the day of     admission.  The patient did not show withdrawal signs throughout     the approximately 48-72 hours of hospital admission, and no Ativan     was required. 3. Chronic pancreatitis.  The patient has a history of chronic     pancreatitis, likely causing some chronic malabsorption as well as     some chronic abdominal pain.  The patient was managed on her     outpatient regimen of oxycodone for pain and pancreatic digestive     enzymes for malabsorption. She patient was discharged on the     same medications.  DISCHARGE VITAL SIGNS:  Temperature 97.6, blood pressure 122/78, heart rate 85, respirations 20, O2 saturation 100% on  room air.  DISCHARGE LABORATORY DATA:  Sodium 136, potassium 4.2, chloride 109, bicarb 18, BUN 9, creatinine 0.91, glucose 102, calcium 7.8.  Magnesium 2.0.    ______________________________ Janalyn Harder, MD   ______________________________ [name] Leigh. Ninetta Lights, M.D.    RB/MEDQ  D:  04/07/2011  T:  04/08/2011  Job:  [id number]  Electronically Signed by Janalyn Harder MD on 04/16/2011 08:16:47 AM Electronically Signed by Johny Sax M.D. on 07/06/2011 03:37:05 PM

## 2011-07-10 ENCOUNTER — Encounter (HOSPITAL_COMMUNITY): Payer: Self-pay

## 2011-07-11 ENCOUNTER — Ambulatory Visit (HOSPITAL_COMMUNITY)
Admission: RE | Admit: 2011-07-11 | Discharge: 2011-07-11 | Disposition: A | Discharge status: Home / Self Care | Payer: PRIVATE HEALTH INSURANCE | Source: Ambulatory Visit | Attending: Gastroenterology | Admitting: Gastroenterology

## 2011-07-11 ENCOUNTER — Encounter (HOSPITAL_COMMUNITY)
Admission: RE | Disposition: A | Discharge status: Home / Self Care | Payer: Self-pay | Source: Ambulatory Visit | Attending: Gastroenterology

## 2011-07-11 ENCOUNTER — Encounter (HOSPITAL_COMMUNITY): Payer: Self-pay | Admitting: *Deleted

## 2011-07-11 ENCOUNTER — Other Ambulatory Visit: Payer: Self-pay | Admitting: Gastroenterology

## 2011-07-11 DIAGNOSIS — K644 Residual hemorrhoidal skin tags: Secondary | ICD-10-CM | POA: Insufficient documentation

## 2011-07-11 DIAGNOSIS — K2970 Gastritis, unspecified, without bleeding: Secondary | ICD-10-CM | POA: Insufficient documentation

## 2011-07-11 DIAGNOSIS — Z800 Family history of malignant neoplasm of digestive organs: Secondary | ICD-10-CM | POA: Insufficient documentation

## 2011-07-11 DIAGNOSIS — D509 Iron deficiency anemia, unspecified: Secondary | ICD-10-CM | POA: Insufficient documentation

## 2011-07-11 DIAGNOSIS — D126 Benign neoplasm of colon, unspecified: Secondary | ICD-10-CM | POA: Insufficient documentation

## 2011-07-11 DIAGNOSIS — I10 Essential (primary) hypertension: Secondary | ICD-10-CM | POA: Insufficient documentation

## 2011-07-11 DIAGNOSIS — K219 Gastro-esophageal reflux disease without esophagitis: Secondary | ICD-10-CM | POA: Insufficient documentation

## 2011-07-11 DIAGNOSIS — K2980 Duodenitis without bleeding: Secondary | ICD-10-CM | POA: Insufficient documentation

## 2011-07-11 DIAGNOSIS — K648 Other hemorrhoids: Secondary | ICD-10-CM | POA: Insufficient documentation

## 2011-07-11 HISTORY — PX: ESOPHAGOGASTRODUODENOSCOPY: SHX5428

## 2011-07-11 HISTORY — PX: COLONOSCOPY: SHX5424

## 2011-07-11 SURGERY — EGD (ESOPHAGOGASTRODUODENOSCOPY)
Anesthesia: Moderate Sedation

## 2011-07-11 MED ORDER — FENTANYL CITRATE 0.05 MG/ML IJ SOLN
INTRAMUSCULAR | Status: AC
  Filled 2011-07-11: qty 4

## 2011-07-11 MED ORDER — DIPHENHYDRAMINE HCL 50 MG/ML IJ SOLN
INTRAMUSCULAR | Status: DC | PRN
  Administered 2011-07-11: 25 mg via INTRAVENOUS

## 2011-07-11 MED ORDER — DIPHENHYDRAMINE HCL 50 MG/ML IJ SOLN
INTRAMUSCULAR | Status: AC
Start: 2011-07-11 — End: 2011-07-11
  Filled 2011-07-11: qty 1

## 2011-07-11 MED ORDER — FENTANYL NICU IV SYRINGE 50 MCG/ML
INJECTION | INTRAMUSCULAR | Status: DC | PRN
  Administered 2011-07-11 (×3): 25 ug via INTRAVENOUS

## 2011-07-11 MED ORDER — BUTAMBEN-TETRACAINE-BENZOCAINE 2-2-14 % EX AERO
INHALATION_SPRAY | CUTANEOUS | Status: DC | PRN
  Administered 2011-07-11: 2 via TOPICAL

## 2011-07-11 MED ORDER — SODIUM CHLORIDE 0.9 % IV SOLN
Freq: Once | INTRAVENOUS | Status: AC
  Administered 2011-07-11: 500 mL via INTRAVENOUS

## 2011-07-11 MED ORDER — MIDAZOLAM HCL 10 MG/2ML IJ SOLN
INTRAMUSCULAR | Status: DC | PRN
  Administered 2011-07-11 (×3): 2 mg via INTRAVENOUS

## 2011-07-11 MED ORDER — MIDAZOLAM HCL 10 MG/2ML IJ SOLN
INTRAMUSCULAR | Status: AC
  Filled 2011-07-11: qty 4

## 2011-07-11 NOTE — Op Note (Signed)
[facility] [address] [place], [place]  [address]  OPERATIVE PROCEDURE REPORT  PATIENT:  [name], [name]  MR#:  [id number] BIRTHDATE:  May 27, 1956  GENDER:  female ENDOSCOPIST:  Jeani Hawking, MD PROCEDURE DATE:  07/11/2011 PROCEDURE:  Colonoscopy with biopsy ASA CLASS:  Class III INDICATIONS:  Iron Deficiency Anemia MEDICATIONS:  Fentanyl 50 mcg IV, Versed 4 mg IV  DESCRIPTION OF PROCEDURE:   After the risks benefits and alternatives of the procedure were thoroughly explained, informed consent was obtained.  Digital rectal exam was performed and revealed no abnormalities.   The  endoscope was introduced through the anus and advanced to the cecum, which was identified by both the appendix and ileocecal valve, without limitations.  The quality of the prep was good..  The instrument was then slowly withdrawn as the colon was fully examined. <<PROCEDUREIMAGES>>  FINDINGS:  In the cecum a 3 mm firm polyp was identified. It was not able to be removed with a snare and cold biopsies were obtained. ? carcinoid. Adjacent to the ICV was a 1.5 cm submucosal firm mass. A small polyp overlied the mass and this was removed with a cold biopsy forcep. I was not able to intubate the TI. No other abnormalities were identified.   Retroflexed views in the rectum revealed internal and external hemorrhoids.    The scope was then withdrawn from the patient and the procedure terminated.  COMPLICATIONS:  None  IMPRESSION:  1) Polyp, multiple 2) Internal and external hemorrhoids 3) ICV submucosal mass - ? Carcinoid RECOMMENDATIONS:  1) Await biopsy results. 2) CT scan of the ABM/Pelvis.  ______________________________ Jeani Hawking, MD  CPT CODES:  211.3, 455.6, 280.0  DIAGNOSIS CODES:  62130  n. eSIGNED:   Jeani Hawking at 07/11/2011 01:47 PM  Marvene Staff, [phone]

## 2011-07-11 NOTE — H&P (Signed)
  Reason for Consult: Iron Deficiency Anemia Referring Physician: Jeani Hawking, M.D.  [name] [name] HPI: This is a 56 year old female with multiple medical problems and a history of anemia.  She reports having intermittent melena.  No reports of abdominal pain, nausea, vomiting, fevers, etc.  She has a strong history of ETOH abuse.  Past Medical History  Diagnosis Date  . Anemia, B12 deficiency   . History of acute pancreatitis   . Right knee pain     No recent imaging on chart  . 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 Dr. Su Hilt, [facility]  . Hypertriglyceridemia   . GERD (gastroesophageal reflux disease)   . Vitamin d deficiency   . Subdural hematoma 02/2008    Likely 2/2 trauma from seizure from EtOH withdrawal, chronic in nature, sees Dr. Robyne Askew. Most recent CT head 10/2009 showing stable but persistent hematoma without mass effect.  . History of seizure disorder     Likely 2/2 alcohol abuse  . Hypocalcemia   . Hypomagnesemia   . Failure to thrive     Unclear etiology  . HTN (hypertension)   . Thrombocytopenia   . Anemia, macrocytic   . Hepatomegaly     On exam  . Alcohol abuse     Past Surgical History  Procedure Date  . Cesarean section 1983    Family History  Problem Relation Age of Onset  . Cancer Mother     Died from stomach cancer and "flesh eating rash  . Heart failure Father     Died in 41s from an MI  . Alcohol abuse Sister     Twin sister drinks a lot, as did both her parents and brothers  . Stroke Brother     Has 7 brothers, 1 with CVA    Social History:  reports that she quit smoking about 9 months ago. Her smoking use included Cigarettes. She does not have any smokeless tobacco history on file. She reports that she drinks alcohol. She reports that she does not use illicit drugs.  Allergies:  Allergies  Allergen Reactions  . Amitriptyline Hcl Swelling    In the face.  .  Doxycycline Hyclate Itching    Feels like something crawling under her skin    Medications:  Scheduled:   . sodium chloride   Intravenous Once   Continuous:   No results found for this or any previous visit (from the past 24 hour(s)).   No results found.  ROS:  As stated above in the HPI otherwise negative.  Blood pressure 147/91, temperature 97 F (36.1 C), resp. rate 15, SpO2 99.00%.    PE: Gen: NAD, Alert and Oriented HEENT:  [place]/AT, EOMI Neck: Supple, no LAD Lungs: CTA Bilaterally CV: RRR without M/G/R ABM: Soft, NTND, +BS Ext: No C/C/E  Assessment/Plan: 1) Iron Deficiency Anemia  Plan: 1) EGD/Colonoscopy today.  [name] [name] D 07/11/2011, 1:03 PM

## 2011-07-11 NOTE — Op Note (Signed)
[facility] [address] [place], [place]  [address]  OPERATIVE PROCEDURE REPORT  PATIENT:  [name] [name], [name] [name]  MR#:  [id number] BIRTHDATE:  July 28, 1955  GENDER:  female ENDOSCOPIST:  Jeani Hawking, MD ASSISTANT:  Felecia Shelling, RN and Windell Hummingbird, Technician PROCEDURE DATE:  07/11/2011 PROCEDURE:  EGD with biopsy, [phone] ASA CLASS:  Class III INDICATIONS:  Iron Deficiency Anemia MEDICATIONS:  Fentanyl 25 mcg IV mcg IV, Versed 2 mg IV, Benadryl 25 mg IV TOPICAL ANESTHETIC:  Cetacaine Spray  DESCRIPTION OF PROCEDURE:   After the risks benefits and alternatives of the procedure were thoroughly explained, informed consent was obtained.  The Pentax Gastroscope [id number] endoscope was introduced through the mouth and advanced to the second portion of the duodenum, without limitations.  The instrument was slowly withdrawn as the mucosa was fully examined. <<PROCEDUREIMAGES>>  FINDINGS: A mild antral gastritis was identified and multiple cold biopsies were obtained. A mild duodenitis was identified. No other abnormalities noted.    Retroflexed views revealed no abnormalities.    The scope was then withdrawn from the patient and the procedure terminated.  COMPLICATIONS:  None  IMPRESSION:  1) Gastritis 2) Duodenitis RECOMMENDATIONS:  1) Await biopsy results 2) Proceed with the colonoscopy.  ______________________________ Jeani Hawking, MD  n. Rosalie DoctorJeani Hawking at 07/11/2011 01:18 PM  Marvene Staff, [phone]

## 2011-07-12 ENCOUNTER — Encounter (HOSPITAL_COMMUNITY): Payer: Self-pay | Admitting: Gastroenterology

## 2011-07-12 ENCOUNTER — Other Ambulatory Visit: Payer: Self-pay | Admitting: Gastroenterology

## 2011-07-12 DIAGNOSIS — K6389 Other specified diseases of intestine: Secondary | ICD-10-CM

## 2011-07-13 ENCOUNTER — Ambulatory Visit
Admission: RE | Admit: 2011-07-13 | Discharge: 2011-07-13 | Disposition: A | Discharge status: Home / Self Care | Payer: PRIVATE HEALTH INSURANCE | Source: Ambulatory Visit | Attending: Gastroenterology | Admitting: Gastroenterology

## 2011-07-13 DIAGNOSIS — K6389 Other specified diseases of intestine: Secondary | ICD-10-CM

## 2011-07-13 MED ORDER — IOHEXOL 300 MG/ML  SOLN
85.0000 mL | Freq: Once | INTRAMUSCULAR | Status: AC | PRN
  Administered 2011-07-13: 85 mL via INTRAVENOUS

## 2011-08-03 ENCOUNTER — Encounter (INDEPENDENT_AMBULATORY_CARE_PROVIDER_SITE_OTHER): Payer: Self-pay | Admitting: General Surgery

## 2011-08-07 ENCOUNTER — Ambulatory Visit (INDEPENDENT_AMBULATORY_CARE_PROVIDER_SITE_OTHER): Payer: PRIVATE HEALTH INSURANCE | Admitting: General Surgery

## 2011-08-07 ENCOUNTER — Encounter (INDEPENDENT_AMBULATORY_CARE_PROVIDER_SITE_OTHER): Payer: Self-pay | Admitting: General Surgery

## 2011-08-07 DIAGNOSIS — K6389 Other specified diseases of intestine: Secondary | ICD-10-CM

## 2011-08-07 DIAGNOSIS — F1010 Alcohol abuse, uncomplicated: Secondary | ICD-10-CM

## 2011-08-07 NOTE — Progress Notes (Signed)
Subjective:     [name], female   DOB: 09-09-1955, 56 y.o.   MRN: [id number]  HPI We are asked to see the patient in consultation by Dr. Jeani Hawking to evaluate her for a cecal mass. The patient is a 56 year old white female who has been experiencing dark stools off and on for the last 6-7 months. She also will occasionally get some belly aches mostly in the right upper quadrant. She is felt weak lately and has been having problems with falling and lightheadedness. She was found to be anemic and as part of her workup she underwent a colonoscopy. 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 It is not clear whether this is a benign or malignant tumor but it was not completely removed.  Review of Systems  Constitutional: Positive for appetite change.  HENT: Negative.   Eyes: Negative.   Respiratory: Negative.   Cardiovascular: Negative.   Gastrointestinal: Positive for abdominal pain and diarrhea.  Genitourinary: Negative.   Musculoskeletal: Negative.   Skin: Negative.   Neurological: Positive for light-headedness.  Hematological: Negative.   Psychiatric/Behavioral: Negative.        Objective:   Physical Exam  Constitutional: She is oriented to person, place, and time.       Thin and cachectic appearing  HENT:  Head: Normocephalic and atraumatic.  Eyes: Conjunctivae and EOM are normal. Pupils are equal, round, and reactive to light.  Neck: Normal range of motion. Neck supple.  Cardiovascular: Normal rate, regular rhythm and normal heart sounds.   Pulmonary/Chest: Effort normal and breath sounds normal.  Abdominal: Soft. Bowel sounds are normal.       Mild right upper quadrant tenderness. The liver is enlarged.  Musculoskeletal: Normal range of motion.  Neurological: She is alert and oriented to person, place, and time.  Skin: Skin is warm and dry.  Psychiatric: She has a normal mood and affect. Her behavior is normal.      Assessment:     Granular cell tumor of the cecum    Plan:     Because of the GI bleed and anemia that she has been experiencing and the finding of the tumor in her cecum I believe she is going to require a right colectomy. I think she would be a very good laparoscopic candidate. Unfortunately I think she is a very high-risk patient because of her cirrhosis and substance abuse as well as the fact that she is on steroids. I have discussed her in detail the risks and benefits of the operation remove this tumor from her colon as well as some of the technical aspects including the risk of leak from anastomosis and she understands and wishes to proceed. I will plan to obtain lab work up found her to evaluate her liver function and coag status prior to surgery.

## 2011-08-20 ENCOUNTER — Other Ambulatory Visit: Payer: Self-pay | Admitting: Internal Medicine

## 2011-08-21 ENCOUNTER — Encounter (HOSPITAL_COMMUNITY)
Admission: RE | Admit: 2011-08-21 | Discharge: 2011-08-21 | Disposition: A | Discharge status: Home / Self Care | Payer: PRIVATE HEALTH INSURANCE | Source: Ambulatory Visit | Attending: General Surgery | Admitting: General Surgery

## 2011-08-21 ENCOUNTER — Encounter (HOSPITAL_COMMUNITY): Payer: Self-pay

## 2011-08-21 HISTORY — DX: Unspecified convulsions: R56.9

## 2011-08-21 HISTORY — DX: Respiratory tuberculosis unspecified: A15.9

## 2011-08-21 LAB — TYPE AND SCREEN: Antibody Screen: NEGATIVE

## 2011-08-21 LAB — COMPREHENSIVE METABOLIC PANEL
ALT: 17 U/L (ref 0–35)
AST: 67 U/L — ABNORMAL HIGH (ref 0–37)
Alkaline Phosphatase: 197 U/L — ABNORMAL HIGH (ref 39–117)
CO2: 21 mEq/L (ref 19–32)
GFR calc Af Amer: >90 mL/min (ref >90)
GFR calc non Af Amer: >90 mL/min (ref >90)
Glucose, Bld: 103 mg/dL — ABNORMAL HIGH (ref 70–99)
Potassium: 4.2 mEq/L (ref 3.5–5.1)
Sodium: 135 mEq/L (ref 135–145)
Total Protein: 9 g/dL — ABNORMAL HIGH (ref 6.0–8.3)

## 2011-08-21 LAB — PROTIME-INR: Prothrombin Time: 16.1 seconds — ABNORMAL HIGH (ref 11.6–15.2)

## 2011-08-21 LAB — DIFFERENTIAL
Eosinophils Absolute: 0 10*3/uL (ref 0.0–0.7)
Eosinophils Relative: 1 % (ref 0–5)
Lymphs Abs: 2.1 10*3/uL (ref 0.7–4.0)
Monocytes Absolute: 0.9 10*3/uL (ref 0.1–1.0)

## 2011-08-21 LAB — CBC
Hemoglobin: 11.1 g/dL — ABNORMAL LOW (ref 12.0–15.0)
RBC: 3.28 MIL/uL — ABNORMAL LOW (ref 3.87–5.11)

## 2011-08-21 LAB — APTT: aPTT: 30 seconds (ref 24–37)

## 2011-08-21 NOTE — Pre-Procedure Instructions (Signed)
[address]  08/21/2011   Your procedure is scheduled on:  Monday  08/28/11    Report to [facility] at 920 AM.  Call this number if you have problems the morning of surgery: [phone]   Remember:   Do not eat food:After Midnight.  May have clear liquids: up to 4 Hours before arrival.  Clear liquids include soda, tea, black coffee, apple or grape juice, broth.  Take these medicines the morning of surgery with A SIP OF WATER:  NORVASC, WELLBUTRIN, PROZAC,    Do not wear jewelry, make-up or nail polish.  Do not wear lotions, powders, or perfumes. You may wear deodorant.  Do not shave 48 hours prior to surgery.  Do not bring valuables to the hospital.  Contacts, dentures or bridgework may not be worn into surgery.  Leave suitcase in the car. After surgery it may be brought to your room.  For patients admitted to the hospital, checkout time is 11:00 AM the day of discharge.   Patients discharged the day of surgery will not be allowed to drive home.  Name and phone number of your driver:  Special Instructions: CHG Shower Use Special Wash: 1/2 bottle night before surgery and 1/2 bottle morning of surgery.   Please read over the following fact sheets that you were given: Pain Booklet, MRSA Information and Surgical Site Infection Prevention

## 2011-08-21 NOTE — Progress Notes (Signed)
ALLISON TO REVIEW PREOP LABS.

## 2011-08-22 NOTE — Consult Note (Addendum)
Anesthesia:  Patient is a 56 year old female scheduled for a laparoscopic assisted right colectomy for a cecal mass on 08/28/11.  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  It appears she is followed at [facility].  Her Gastroenterologist is Dr. Elnoria Howard.  Labs noted.  Her Alk Phos is elevated at 197, AST elevated at 67, and ALT WNL at 17.  Her H/H are 11.1/32.7.  PLT count is normal at 277.  PT is mildly elevated at 16.1, but her INR and PTT are WNL.  Currently there are no comparison LFTs.  A T&S has already been done.  CXR on 12/21/10 showed: Larger lung volumes. No acute cardiopulmonary abnormality  EKG on 04/05/11 showed NSR, anteroseptal infarct (age undetermined), nonspecific ST/T wave changes.  T wave changes in aVL are slightly more pronounced, otherwise I don't think it is significantly changed since 2009.    She had an echo done on 01/16/06 that showed: - Overall left ventricular systolic function was normal. Left ventricular ejection fraction was estimated , range being 55 % to 65 %. Although no diagnostic left ventricular regional wall motion abnormality was identified, this possibility cannot be completely excluded on the basis of this study. Left ventricular wall thickness was at the upper limits of normal. There was an increased relative contribution of atrial contraction to left ventricular filling.  There was no significant valvular abnormalities.  I'll see if Dr. Haywood Pao office will send [place] his last office notes/labs to ensure stability, but based on her current results would anticipate she can proceed if her urine drug screen is not  worrisome.  Addendum: 08/24/11 1225  I reviewed latest records from Dr. Haywood Pao office.  Still no comparison labs.  Her AST is less than 2X elevated and her PLT count, INR, and PTT are all WNL, so feel she can proceed without further lab testing other than the previously ordered UDS.

## 2011-08-24 NOTE — Progress Notes (Signed)
Copies of last OV notes and Path reports received from Dr. Haywood Pao office.  Chart given to A. Zelenak,PA, to review.//L. Petrice Beedy,RN

## 2011-08-25 ENCOUNTER — Telehealth (INDEPENDENT_AMBULATORY_CARE_PROVIDER_SITE_OTHER): Payer: Self-pay

## 2011-08-25 NOTE — Telephone Encounter (Signed)
Patient confused about dose of Miralax for one day bowel prep for surgery. She is advised purchase the 250-255 gm bottle that is OTC.  Do not exceed that amount. Do not purchase the 527 gm bottle.

## 2011-08-27 MED ORDER — CHLORHEXIDINE GLUCONATE 4 % EX LIQD: 1.0000 "application " | Freq: Once | CUTANEOUS | Status: DC

## 2011-08-27 MED ORDER — SODIUM CHLORIDE 0.9 % IV SOLN
1.0000 g | INTRAVENOUS | Status: AC
  Administered 2011-08-28: 1 g via INTRAVENOUS
  Filled 2011-08-27: qty 1

## 2011-08-27 MED ORDER — ALVIMOPAN 12 MG PO CAPS
12.0000 mg | ORAL_CAPSULE | Freq: Once | ORAL | Status: AC
  Administered 2011-08-28: 12 mg via ORAL
  Filled 2011-08-27: qty 1

## 2011-08-28 ENCOUNTER — Encounter (HOSPITAL_COMMUNITY): Payer: Self-pay | Admitting: General Practice

## 2011-08-28 ENCOUNTER — Ambulatory Visit (HOSPITAL_COMMUNITY): Payer: PRIVATE HEALTH INSURANCE | Admitting: Vascular Surgery

## 2011-08-28 ENCOUNTER — Encounter (HOSPITAL_COMMUNITY): Payer: Self-pay | Admitting: *Deleted

## 2011-08-28 ENCOUNTER — Inpatient Hospital Stay (HOSPITAL_COMMUNITY)
Admission: RE | Admit: 2011-08-28 | Discharge: 2011-09-04 | DRG: 982 | Disposition: A | Discharge status: Home / Self Care | Payer: PRIVATE HEALTH INSURANCE | Source: Ambulatory Visit | Attending: General Surgery | Admitting: General Surgery

## 2011-08-28 ENCOUNTER — Encounter (HOSPITAL_COMMUNITY): Payer: Self-pay | Admitting: Vascular Surgery

## 2011-08-28 ENCOUNTER — Encounter (HOSPITAL_COMMUNITY)
Admission: RE | Disposition: A | Discharge status: Home / Self Care | Payer: Self-pay | Source: Ambulatory Visit | Attending: General Surgery

## 2011-08-28 DIAGNOSIS — F3289 Other specified depressive episodes: Secondary | ICD-10-CM | POA: Diagnosis present

## 2011-08-28 DIAGNOSIS — F10939 Alcohol use, unspecified with withdrawal, unspecified: Secondary | ICD-10-CM | POA: Diagnosis present

## 2011-08-28 DIAGNOSIS — D518 Other vitamin B12 deficiency anemias: Secondary | ICD-10-CM | POA: Diagnosis present

## 2011-08-28 DIAGNOSIS — Z01812 Encounter for preprocedural laboratory examination: Secondary | ICD-10-CM

## 2011-08-28 DIAGNOSIS — G40909 Epilepsy, unspecified, not intractable, without status epilepticus: Secondary | ICD-10-CM | POA: Diagnosis present

## 2011-08-28 DIAGNOSIS — F10239 Alcohol dependence with withdrawal, unspecified: Secondary | ICD-10-CM | POA: Diagnosis present

## 2011-08-28 DIAGNOSIS — R64 Cachexia: Secondary | ICD-10-CM | POA: Diagnosis present

## 2011-08-28 DIAGNOSIS — K560 Paralytic ileus: Secondary | ICD-10-CM | POA: Diagnosis not present

## 2011-08-28 DIAGNOSIS — W19XXXA Unspecified fall, initial encounter: Secondary | ICD-10-CM | POA: Diagnosis present

## 2011-08-28 DIAGNOSIS — D649 Anemia, unspecified: Secondary | ICD-10-CM | POA: Diagnosis present

## 2011-08-28 DIAGNOSIS — R42 Dizziness and giddiness: Secondary | ICD-10-CM | POA: Diagnosis present

## 2011-08-28 DIAGNOSIS — K219 Gastro-esophageal reflux disease without esophagitis: Secondary | ICD-10-CM | POA: Diagnosis present

## 2011-08-28 DIAGNOSIS — F329 Major depressive disorder, single episode, unspecified: Secondary | ICD-10-CM | POA: Diagnosis present

## 2011-08-28 DIAGNOSIS — K929 Disease of digestive system, unspecified: Secondary | ICD-10-CM | POA: Diagnosis not present

## 2011-08-28 DIAGNOSIS — D214 Benign neoplasm of connective and other soft tissue of abdomen: Principal | ICD-10-CM | POA: Diagnosis present

## 2011-08-28 DIAGNOSIS — R160 Hepatomegaly, not elsewhere classified: Secondary | ICD-10-CM | POA: Diagnosis present

## 2011-08-28 DIAGNOSIS — D126 Benign neoplasm of colon, unspecified: Secondary | ICD-10-CM

## 2011-08-28 DIAGNOSIS — I10 Essential (primary) hypertension: Secondary | ICD-10-CM | POA: Diagnosis present

## 2011-08-28 DIAGNOSIS — Z681 Body mass index (BMI) 19 or less, adult: Secondary | ICD-10-CM

## 2011-08-28 DIAGNOSIS — Z883 Allergy status to other anti-infective agents status: Secondary | ICD-10-CM

## 2011-08-28 DIAGNOSIS — Z8611 Personal history of tuberculosis: Secondary | ICD-10-CM

## 2011-08-28 HISTORY — PX: RIGHT COLECTOMY: SHX853

## 2011-08-28 LAB — RAPID URINE DRUG SCREEN, HOSP PERFORMED
Amphetamines: NOT DETECTED
Benzodiazepines: NOT DETECTED
Cocaine: NOT DETECTED
Opiates: NOT DETECTED

## 2011-08-28 SURGERY — COLECTOMY, RIGHT, LAPAROSCOPIC
Anesthesia: General | Site: Abdomen | Laterality: Right | Wound class: Clean Contaminated

## 2011-08-28 MED ORDER — PROPOFOL 10 MG/ML IV BOLUS
INTRAVENOUS | Status: DC | PRN
  Administered 2011-08-28: 180 mg via INTRAVENOUS

## 2011-08-28 MED ORDER — CYANOCOBALAMIN 1000 MCG/ML IJ SOLN: 1000.0000 ug | INTRAMUSCULAR | Status: DC

## 2011-08-28 MED ORDER — MIDAZOLAM HCL 5 MG/5ML IJ SOLN
INTRAMUSCULAR | Status: DC | PRN
  Administered 2011-08-28: 1 mg via INTRAVENOUS

## 2011-08-28 MED ORDER — HYDROMORPHONE HCL PF 1 MG/ML IJ SOLN
0.2500 mg | INTRAMUSCULAR | Status: DC | PRN
  Administered 2011-08-28 (×2): 0.5 mg via INTRAVENOUS

## 2011-08-28 MED ORDER — ONDANSETRON HCL 4 MG/2ML IJ SOLN: 4.0000 mg | Freq: Four times a day (QID) | INTRAMUSCULAR | Status: DC | PRN

## 2011-08-28 MED ORDER — ALVIMOPAN 12 MG PO CAPS
12.0000 mg | ORAL_CAPSULE | Freq: Two times a day (BID) | ORAL | Status: AC
  Administered 2011-08-28 – 2011-09-04 (×14): 12 mg via ORAL
  Filled 2011-08-28 (×14): qty 1

## 2011-08-28 MED ORDER — BUPROPION HCL 100 MG PO TABS
100.0000 mg | ORAL_TABLET | Freq: Three times a day (TID) | ORAL | Status: DC
  Administered 2011-08-28 – 2011-09-04 (×20): 100 mg via ORAL
  Filled 2011-08-28 (×23): qty 1

## 2011-08-28 MED ORDER — MORPHINE SULFATE 2 MG/ML IJ SOLN: 2.0000 mg | INTRAMUSCULAR | Status: DC | PRN

## 2011-08-28 MED ORDER — PREDNISONE 10 MG PO TABS
10.0000 mg | ORAL_TABLET | Freq: Every day | ORAL | Status: DC
  Administered 2011-08-28 – 2011-09-04 (×8): 10 mg via ORAL
  Filled 2011-08-28 (×9): qty 1

## 2011-08-28 MED ORDER — NEOSTIGMINE METHYLSULFATE 1 MG/ML IJ SOLN
INTRAMUSCULAR | Status: DC | PRN
  Administered 2011-08-28: 4 mg via INTRAVENOUS

## 2011-08-28 MED ORDER — PHENYLEPHRINE HCL 10 MG/ML IJ SOLN
INTRAMUSCULAR | Status: DC | PRN
  Administered 2011-08-28: 40 ug via INTRAVENOUS

## 2011-08-28 MED ORDER — SODIUM CHLORIDE 0.9 % IJ SOLN: 9.0000 mL | INTRAMUSCULAR | Status: DC | PRN

## 2011-08-28 MED ORDER — SODIUM CHLORIDE 0.9 % IR SOLN
Status: DC | PRN
  Administered 2011-08-28: 1000 mL

## 2011-08-28 MED ORDER — CYANOCOBALAMIN 1000 MCG/ML IJ SOLN
1000.0000 ug | INTRAMUSCULAR | Status: DC
  Administered 2011-08-31: 1000 ug via INTRAMUSCULAR
  Filled 2011-08-28: qty 1

## 2011-08-28 MED ORDER — WHITE PETROLATUM GEL
Status: AC
  Administered 2011-08-28: 15:00:00
  Filled 2011-08-28: qty 5

## 2011-08-28 MED ORDER — KCL IN DEXTROSE-NACL 20-5-0.9 MEQ/L-%-% IV SOLN
INTRAVENOUS | Status: DC
  Administered 2011-08-28 – 2011-08-30 (×5): via INTRAVENOUS
  Administered 2011-08-31: 75 mL via INTRAVENOUS
  Administered 2011-09-01 – 2011-09-02 (×4): via INTRAVENOUS
  Filled 2011-08-28 (×13): qty 1000

## 2011-08-28 MED ORDER — BUPIVACAINE-EPINEPHRINE 0.25% -1:200000 IJ SOLN
INTRAMUSCULAR | Status: DC | PRN
  Administered 2011-08-28: 15 mL

## 2011-08-28 MED ORDER — VITAMIN B-1 100 MG PO TABS
100.0000 mg | ORAL_TABLET | Freq: Two times a day (BID) | ORAL | Status: DC
  Administered 2011-08-28 – 2011-09-04 (×14): 100 mg via ORAL
  Filled 2011-08-28 (×15): qty 1

## 2011-08-28 MED ORDER — 0.9 % SODIUM CHLORIDE (POUR BTL) OPTIME
TOPICAL | Status: DC | PRN
  Administered 2011-08-28 (×2): 1000 mL

## 2011-08-28 MED ORDER — POVIDONE-IODINE 10 % EX OINT
TOPICAL_OINTMENT | CUTANEOUS | Status: DC | PRN
  Administered 2011-08-28: 1 via TOPICAL

## 2011-08-28 MED ORDER — AMLODIPINE BESYLATE 10 MG PO TABS
10.0000 mg | ORAL_TABLET | Freq: Every day | ORAL | Status: DC
  Administered 2011-08-28 – 2011-09-04 (×8): 10 mg via ORAL
  Filled 2011-08-28 (×8): qty 1

## 2011-08-28 MED ORDER — FLUOXETINE HCL 10 MG PO CAPS
10.0000 mg | ORAL_CAPSULE | Freq: Every day | ORAL | Status: DC
  Administered 2011-08-28 – 2011-09-04 (×8): 10 mg via ORAL
  Filled 2011-08-28 (×8): qty 1

## 2011-08-28 MED ORDER — GABAPENTIN 300 MG PO CAPS
300.0000 mg | ORAL_CAPSULE | Freq: Three times a day (TID) | ORAL | Status: DC
  Administered 2011-08-28 – 2011-09-04 (×20): 300 mg via ORAL
  Filled 2011-08-28 (×24): qty 1

## 2011-08-28 MED ORDER — CALCIUM GLUCONATE 500 MG PO TABS
500.0000 mg | ORAL_TABLET | Freq: Every day | ORAL | Status: DC
  Administered 2011-08-28 – 2011-09-04 (×8): 500 mg via ORAL
  Filled 2011-08-28 (×9): qty 1

## 2011-08-28 MED ORDER — MORPHINE SULFATE (PF) 1 MG/ML IV SOLN
INTRAVENOUS | Status: AC
  Filled 2011-08-28: qty 25

## 2011-08-28 MED ORDER — LABETALOL HCL 5 MG/ML IV SOLN
INTRAVENOUS | Status: DC | PRN
  Administered 2011-08-28: 10 mg via INTRAVENOUS

## 2011-08-28 MED ORDER — HYDROCHLOROTHIAZIDE 25 MG PO TABS
25.0000 mg | ORAL_TABLET | Freq: Every day | ORAL | Status: DC
  Administered 2011-08-29 – 2011-09-04 (×7): 25 mg via ORAL
  Filled 2011-08-28 (×8): qty 1

## 2011-08-28 MED ORDER — LACTATED RINGERS IV SOLN
INTRAVENOUS | Status: DC | PRN
  Administered 2011-08-28 (×2): via INTRAVENOUS

## 2011-08-28 MED ORDER — LACTATED RINGERS IV SOLN
INTRAVENOUS | Status: DC
  Administered 2011-08-28: 11:00:00 via INTRAVENOUS

## 2011-08-28 MED ORDER — DIPHENHYDRAMINE HCL 50 MG/ML IJ SOLN: 12.5000 mg | Freq: Four times a day (QID) | INTRAMUSCULAR | Status: DC | PRN

## 2011-08-28 MED ORDER — MORPHINE SULFATE (PF) 1 MG/ML IV SOLN
INTRAVENOUS | Status: DC
  Administered 2011-08-28: 1 mg via INTRAVENOUS
  Administered 2011-08-28: 14:00:00 via INTRAVENOUS
  Administered 2011-08-28 – 2011-08-29 (×2): 3 mg via INTRAVENOUS
  Administered 2011-08-29: 1 mg via INTRAVENOUS
  Administered 2011-08-29: 2 mg via INTRAVENOUS
  Administered 2011-08-29: 4 mg via INTRAVENOUS
  Administered 2011-08-29: 2 mg via INTRAVENOUS
  Administered 2011-08-29 – 2011-08-31 (×4): 1 mg via INTRAVENOUS

## 2011-08-28 MED ORDER — LIDOCAINE HCL 4 % MT SOLN
OROMUCOSAL | Status: DC | PRN
  Administered 2011-08-28: 4 mL via TOPICAL

## 2011-08-28 MED ORDER — GLYCOPYRROLATE 0.2 MG/ML IJ SOLN
INTRAMUSCULAR | Status: DC | PRN
  Administered 2011-08-28: .5 mg via INTRAVENOUS

## 2011-08-28 MED ORDER — ACETAMINOPHEN 650 MG RE SUPP
650.0000 mg | Freq: Four times a day (QID) | RECTAL | Status: DC | PRN
  Administered 2011-08-28: 650 mg via RECTAL
  Filled 2011-08-28: qty 1

## 2011-08-28 MED ORDER — ONDANSETRON HCL 4 MG PO TABS: 4.0000 mg | ORAL_TABLET | Freq: Four times a day (QID) | ORAL | Status: DC | PRN

## 2011-08-28 MED ORDER — NALOXONE HCL 0.4 MG/ML IJ SOLN: 0.4000 mg | INTRAMUSCULAR | Status: DC | PRN

## 2011-08-28 MED ORDER — DIPHENHYDRAMINE HCL 12.5 MG/5ML PO ELIX
12.5000 mg | ORAL_SOLUTION | Freq: Four times a day (QID) | ORAL | Status: DC | PRN
  Filled 2011-08-28: qty 5

## 2011-08-28 MED ORDER — ROCURONIUM BROMIDE 100 MG/10ML IV SOLN
INTRAVENOUS | Status: DC | PRN
  Administered 2011-08-28: 50 mg via INTRAVENOUS

## 2011-08-28 MED ORDER — FENTANYL CITRATE 0.05 MG/ML IJ SOLN
INTRAMUSCULAR | Status: DC | PRN
  Administered 2011-08-28: 100 ug via INTRAVENOUS
  Administered 2011-08-28 (×2): 50 ug via INTRAVENOUS
  Administered 2011-08-28: 150 ug via INTRAVENOUS

## 2011-08-28 MED ORDER — ONDANSETRON HCL 4 MG/2ML IJ SOLN: 4.0000 mg | Freq: Once | INTRAMUSCULAR | Status: DC | PRN

## 2011-08-28 SURGICAL SUPPLY — 70 items
ADH SKN CLS APL DERMABOND .7 (GAUZE/BANDAGES/DRESSINGS)
APPLIER CLIP ROT 10 11.4 M/L (STAPLE)
APR CLP MED LRG 11.4X10 (STAPLE)
BLADE SURG 10 STRL SS (BLADE) ×2 IMPLANT
BLADE SURG ROTATE 9660 (MISCELLANEOUS) IMPLANT
CANISTER SUCTION 2500CC (MISCELLANEOUS) ×2 IMPLANT
CELLS DAT CNTRL 66122 CELL SVR (MISCELLANEOUS) ×1 IMPLANT
CHLORAPREP W/TINT 26ML (MISCELLANEOUS) ×2 IMPLANT
CLIP APPLIE ROT 10 11.4 M/L (STAPLE) IMPLANT
CLOTH BEACON ORANGE TIMEOUT ST (SAFETY) ×2 IMPLANT
COVER SURGICAL LIGHT HANDLE (MISCELLANEOUS) ×2 IMPLANT
DECANTER SPIKE VIAL GLASS SM (MISCELLANEOUS) ×2 IMPLANT
DERMABOND ADVANCED (GAUZE/BANDAGES/DRESSINGS)
DERMABOND ADVANCED .7 DNX12 (GAUZE/BANDAGES/DRESSINGS) IMPLANT
DRAPE UTILITY 15X26 W/TAPE STR (DRAPE) ×4 IMPLANT
DRAPE WARM FLUID 44X44 (DRAPE) ×2 IMPLANT
ELECT CAUTERY BLADE 6.4 (BLADE) ×4 IMPLANT
ELECT REM PT RETURN 9FT ADLT (ELECTROSURGICAL) ×2
ELECTRODE REM PT RTRN 9FT ADLT (ELECTROSURGICAL) ×1 IMPLANT
GLOVE BIO SURGEON STRL SZ7 (GLOVE) ×1 IMPLANT
GLOVE BIO SURGEON STRL SZ7.5 (GLOVE) ×4 IMPLANT
GLOVE BIOGEL PI IND STRL 7.0 (GLOVE) IMPLANT
GLOVE BIOGEL PI INDICATOR 7.0 (GLOVE) ×3
GLOVE SURG SIGNA 7.5 PF LTX (GLOVE) ×3 IMPLANT
GLOVE SURG SS PI 6.5 STRL IVOR (GLOVE) ×2 IMPLANT
GLOVE SURG SS PI 7.0 STRL IVOR (GLOVE) ×1 IMPLANT
GOWN PREVENTION PLUS XLARGE (GOWN DISPOSABLE) ×1 IMPLANT
GOWN STRL NON-REIN LRG LVL3 (GOWN DISPOSABLE) ×7 IMPLANT
KIT BASIN OR (CUSTOM PROCEDURE TRAY) ×2 IMPLANT
KIT ROOM TURNOVER OR (KITS) ×2 IMPLANT
LIGASURE IMPACT 36 18CM CVD LR (INSTRUMENTS) ×2 IMPLANT
NS IRRIG 1000ML POUR BTL (IV SOLUTION) ×4 IMPLANT
PAD ARMBOARD 7.5X6 YLW CONV (MISCELLANEOUS) ×4 IMPLANT
PENCIL BUTTON HOLSTER BLD 10FT (ELECTRODE) ×2 IMPLANT
RELOAD PROXIMATE 75MM BLUE (ENDOMECHANICALS) ×4 IMPLANT
RELOAD STAPLE 75 3.8 BLU REG (ENDOMECHANICALS) IMPLANT
RETRACTOR WND ALEXIS 18 MED (MISCELLANEOUS) IMPLANT
RTRCTR WOUND ALEXIS 18CM MED (MISCELLANEOUS) ×2
SCALPEL HARMONIC ACE (MISCELLANEOUS) ×1 IMPLANT
SCISSORS LAP 5X35 DISP (ENDOMECHANICALS) IMPLANT
SET IRRIG TUBING LAPAROSCOPIC (IRRIGATION / IRRIGATOR) ×2 IMPLANT
SLEEVE ENDOPATH XCEL 5M (ENDOMECHANICALS) ×4 IMPLANT
SPECIMEN JAR LARGE (MISCELLANEOUS) ×2 IMPLANT
SPONGE GAUZE 4X4 12PLY (GAUZE/BANDAGES/DRESSINGS) ×2 IMPLANT
SPONGE LAP 18X18 X RAY DECT (DISPOSABLE) ×1 IMPLANT
STAPLER GUN LINEAR PROX 60 (STAPLE) ×1 IMPLANT
STAPLER PROXIMATE 75MM BLUE (STAPLE) ×1 IMPLANT
STAPLER VISISTAT 35W (STAPLE) ×2 IMPLANT
SUT PDS AB 1 CT  36 (SUTURE) ×2
SUT PDS AB 1 CT 36 (SUTURE) ×2 IMPLANT
SUT SILK 2 0 (SUTURE) ×2
SUT SILK 2 0 SH CR/8 (SUTURE) ×4 IMPLANT
SUT SILK 2-0 18XBRD TIE 12 (SUTURE) ×1 IMPLANT
SUT SILK 3 0 (SUTURE) ×2
SUT SILK 3 0 SH CR/8 (SUTURE) ×2 IMPLANT
SUT SILK 3-0 18XBRD TIE 12 (SUTURE) ×1 IMPLANT
SYS LAPSCP GELPORT 120MM (MISCELLANEOUS)
SYSTEM LAPSCP GELPORT 120MM (MISCELLANEOUS) IMPLANT
TOWEL OR 17X24 6PK STRL BLUE (TOWEL DISPOSABLE) ×2 IMPLANT
TOWEL OR 17X26 10 PK STRL BLUE (TOWEL DISPOSABLE) ×2 IMPLANT
TRAY FOLEY CATH 14FRSI W/METER (CATHETERS) ×2 IMPLANT
TRAY LAPAROSCOPIC (CUSTOM PROCEDURE TRAY) ×2 IMPLANT
TROCAR XCEL 12X100 BLDLESS (ENDOMECHANICALS) IMPLANT
TROCAR XCEL BLUNT TIP 100MML (ENDOMECHANICALS) ×1 IMPLANT
TROCAR XCEL NON-BLD 11X100MML (ENDOMECHANICALS) IMPLANT
TROCAR XCEL NON-BLD 5MMX100MML (ENDOMECHANICALS) ×2 IMPLANT
TUBE CONNECTING 12X1/4 (SUCTIONS) ×2 IMPLANT
UNDERPAD 30X30 INCONTINENT (UNDERPADS AND DIAPERS) ×1 IMPLANT
WATER STERILE IRR 1000ML POUR (IV SOLUTION) IMPLANT
YANKAUER SUCT BULB TIP NO VENT (SUCTIONS) ×4 IMPLANT

## 2011-08-28 NOTE — Op Note (Signed)
08/28/2011  1:12 PM  PATIENT:  [name] [name]  56 y.o. female  PRE-OPERATIVE DIAGNOSIS:  cecal mass   POST-OPERATIVE DIAGNOSIS:  cecal mass   PROCEDURE:  Procedure(s) (LRB): LAPAROSCOPIC RIGHT COLECTOMY (Right)  SURGEON:  Surgeon(s) and Role:    * Robyne Askew, MD - Primary    * Shelly Rubenstein, MD - Assisting  PHYSICIAN ASSISTANT:   ASSISTANTS: Dr. Magnus Ivan   ANESTHESIA:   general  EBL:  Total I/O In: 1000 [I.V.:1000] Out: 150 [Urine:150]  BLOOD ADMINISTERED:none  DRAINS: none   LOCAL MEDICATIONS USED:  Marcaine  SPECIMEN:  Source of Specimen:  terminal ileum and right colon  DISPOSITION OF SPECIMEN:  PATHOLOGY  COUNTS:  YES  TOURNIQUET:  * No tourniquets in log *  DICTATION: .Dragon Dictation After informed consent was obtained the patient was brought to the operating room and placed in the supine position on the operating room table. After adequate induction of general anesthesia the patient's abdomen was prepped with ChloraPrep, allowed to dry, and draped in usual sterile manner. A site was chosen in the left upper quadrant for placement of a 5 mm Optiview. This area was infiltrated with quarter percent Marcaine. A small stab incision was made with a 15 blade knife. A 5 Optiview was used to dissect through the layers of the abdominal wall under direct vision until we had access to the abdominal cavity. The abdomen was then insufflated with carbon dioxide without difficulty. A 5 mm laparoscope was placed through the port and we were able to readily see in the abdominal cavity. A site was chosen in the left lower quadrant for placement of a 5 mm port. This area was infiltrated with quarter percent Marcaine and a small stab incision was made with a 10 blade knife. A 5 mm port was placed bluntly through this incision into the abdominal cavity under direct vision. There were some filmy omental adhesions in the lower abdomen that were taken down sharply with the  harmonic scalpel without difficulty. Another site was chosen in the suprapubic area for another 5 mm port. This area was infiltrated with quarter percent Marcaine and a small stab incision was made with a 15 blade knife. A 5 mm port was placed bluntly through this incision into the abdominal cavity under direct vision. We were then able to reflect the omentum upwards. The terminal ileum and right colon were readily identified. The terminal ileum was very mobile. The retroperitoneal attachments along the white line of Toldt was taken down sharply with the harmonic scalpel. Once this was accomplished the right colon was very mobile. We then decided to make a small transversely oriented incision at the level of the umbilicus overlying the right colon. This was done sharply with a 15 blade knife and electrocautery. The incision was carried through the subcutaneous tissue and muscle and fascia until we had entered the abdominal cavity without difficulty. We readily identified the right colon and brought it up into the wound. Prior to doing this we did place a wound protector. We chose a site at the terminal ileum to divide the small intestine. The mesentery at this point was opened sharply with the electrocautery. A GIA 75 stapler was placed across the small intestine at this point clamped and fired thereby dividing the intestine between staple lines. A site was also chosen just past the hepatic flexure of the colon to divide the colon. The mesentery at this point was opened sharply with the electrocautery. A  GIA 75 stapler was placed across the colon at this point clamped and fired thereby dividing the colon between staple lines. The mesentery to the right colon was then taken down partly with the LigaSure. The main vessels of the right colon were then clamped with hemostats divided and ligated with 2-0 silk suture ligatures. The specimen was removed and opened on the back table. The previous biopsy sites as well as a  subcutaneous mass were both identified. The specimen was then sent to pathology for further evaluation. The distal small intestine and proximal transverse colon were readily approximated each other. A small enterotomy was made on the antimesenteric border of each end of the small bowel and colon. Each limb of a GIA-75 stapler was then placed down the appropriate limb of small bowel or colon. The stapler was then clamped and fired thereby creating a nice widely patent enteroenterostomy. The common enterotomy was closed with a TA 60 stapler. Prior to closing the enterotomy the staple line was inspected from the inside and was hemostatic. Externally the staple line was then imbricated with multiple 2-0 silk Lembert stitches. A 2-0 silk crotch stitch was placed at the end of the anastomosis. The mesentery was then closed with multiple interrupted 2-0 silk stitches. At this point the anastomosis looked healthy without any tension. The area appeared to be hemostatic. We then placed the small bowel and colon back in the abdominal cavity. We irrigated the abdominal cavity with copious amounts of saline. We closed the fascia of the anterior bowel wall with 2 running #1 PDS stitches. The subcutaneous tissue was irrigated with copious amounts of saline. The skin was closed with staples. We then reinsufflated the abdominal cavity. We placed the 5 mm camera through the port. The abdomen was inspected and appeared to be hemostatic. The gas was then allowed to escape and the 5 mm ports were removed without difficulty. The 5 mm port sites were closed with staples. Sterile dressings were applied. The patient tolerated the procedure well. At the end of the distal needle sponge and instrument counts were correct. The patient was then awakened and taken to recovery in stable condition.  PLAN OF CARE: Admit to inpatient   PATIENT DISPOSITION:  PACU - hemodynamically stable.   Delay start of Pharmacological VTE agent (>24hrs) due  to surgical blood loss or risk of bleeding: yes

## 2011-08-28 NOTE — Anesthesia Postprocedure Evaluation (Signed)
  Anesthesia Post-op Note  Patient: [name] [name]  Procedure(s) Performed: Procedure(s) (LRB): LAPAROSCOPIC RIGHT COLECTOMY (Right)  Patient Location: PACU  Anesthesia Type: General  Level of Consciousness: awake, alert , sedated and patient cooperative  Airway and Oxygen Therapy: Patient Spontanous Breathing and Patient connected to nasal cannula oxygen  Post-op Pain: mild  Post-op Assessment: Post-op Vital signs reviewed, Patient's Cardiovascular Status Stable, Respiratory Function Stable, Patent Airway, No signs of Nausea or vomiting and Pain level controlled  Post-op Vital Signs: stable  Complications: No apparent anesthesia complications

## 2011-08-28 NOTE — Progress Notes (Signed)
Spoke with megan in or room with DR TOTH , MADE AWARE OF AST  AND ALK PHOS INCREASED AND HX MARIJUANNA USE.  DR TOTH STILL WANTS ENTEREG GIVEN.

## 2011-08-28 NOTE — Transfer of Care (Signed)
Immediate Anesthesia Transfer of Care Note  Patient: [name] [name]  Procedure(s) Performed: Procedure(s) (LRB): LAPAROSCOPIC RIGHT COLECTOMY (Right)  Patient Location: PACU  Anesthesia Type: General  Level of Consciousness: sedated  Airway & Oxygen Therapy: Patient Spontanous Breathing and Patient connected to face mask oxygen  Post-op Assessment: Report given to PACU RN and Post -op Vital signs reviewed and stable  Post vital signs: Reviewed and stable  Complications: No apparent anesthesia complications

## 2011-08-28 NOTE — Interval H&P Note (Signed)
History and Physical Interval Note:  08/28/2011 10:47 AM  [name] [name]  has presented today for surgery, with the diagnosis of cecal mass   The various methods of treatment have been discussed with the patient and family. After consideration of risks, benefits and other options for treatment, the patient has consented to  Procedure(s) (LRB): LAPAROSCOPIC RIGHT COLECTOMY (Right) as a surgical intervention .  The patients' history has been reviewed, patient examined, no change in status, stable for surgery.  I have reviewed the patients' chart and labs.  Questions were answered to the patient's satisfaction.     TOTH III,Conrado Nance S

## 2011-08-28 NOTE — Preoperative (Signed)
Beta Blockers   Reason not to administer Beta Blockers:Not Applicable 

## 2011-08-28 NOTE — Anesthesia Procedure Notes (Signed)
Procedure Name: Intubation Date/Time: 08/28/2011 11:35 AM Performed by: Brien Mates DOBSON Pre-anesthesia Checklist: Patient identified Patient Re-evaluated:Patient Re-evaluated prior to inductionOxygen Delivery Method: Circle system utilized Preoxygenation: Pre-oxygenation with 100% oxygen Intubation Type: IV induction Ventilation: Mask ventilation without difficulty Laryngoscope Size: Miller and 2 Grade View: Grade I Tube type: Oral Tube size: 7.0 mm Number of attempts: 1 Airway Equipment and Method: Stylet and LTA kit utilized Placement Confirmation: ETT inserted through vocal cords under direct vision,  positive ETCO2 and breath sounds checked- equal and bilateral Secured at: 21 cm Tube secured with: Tape Dental Injury: Teeth and Oropharynx as per pre-operative assessment

## 2011-08-28 NOTE — Anesthesia Preprocedure Evaluation (Addendum)
Anesthesia Evaluation  Patient identified by MRN, date of birth, ID band Patient awake    Reviewed: Allergy & Precautions, H&P , NPO status , Patient's Chart, lab work & pertinent test results, reviewed documented beta blocker date and time   Airway Mallampati: I TM Distance: >3 FB Neck ROM: full    Dental   Pulmonary          Cardiovascular hypertension, Pt. on medications Rhythm:regular Rate:Normal     Neuro/Psych Seizures -,  PSYCHIATRIC DISORDERS Seizures related to ETOH    GI/Hepatic GERD-  ,(+)     substance abuse   , Hepatitis -Pancreatitis   Endo/Other    Renal/GU      Musculoskeletal   Abdominal   Peds  Hematology   Anesthesia Other Findings   Reproductive/Obstetrics                          Anesthesia Physical Anesthesia Plan  ASA: IV  Anesthesia Plan: General ETT   Post-op Pain Management:    Induction: Intravenous  Airway Management Planned: Oral ETT  Additional Equipment:   Intra-op Plan:   Post-operative Plan: Extubation in OR  Informed Consent: I have reviewed the patients History and Physical, chart, labs and discussed the procedure including the risks, benefits and alternatives for the proposed anesthesia with the patient or authorized representative who has indicated his/her understanding and acceptance.     Plan Discussed with: Anesthesiologist, CRNA and Surgeon  Anesthesia Plan Comments:         Anesthesia Quick Evaluation

## 2011-08-28 NOTE — H&P (View-Only) (Signed)
Subjective:     [name], female   DOB: 05/29/1956, 55 y.o.   MRN: [id number]  HPI We are asked to see the patient in consultation by Dr. Patrick Hung to evaluate her for a cecal mass. The patient is a 55-year-old white female who has been experiencing dark stools off and on for the last 6-7 months. She also will occasionally get some belly aches mostly in the right upper quadrant. She is felt weak lately and has been having problems with falling and lightheadedness. She was found to be anemic and as part of her workup she underwent a colonoscopy. 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 It is not clear whether this is a benign or malignant tumor but it was not completely removed.  Review of Systems  Constitutional: Positive for appetite change.  HENT: Negative.   Eyes: Negative.   Respiratory: Negative.   Cardiovascular: Negative.   Gastrointestinal: Positive for abdominal pain and diarrhea.  Genitourinary: Negative.   Musculoskeletal: Negative.   Skin: Negative.   Neurological: Positive for light-headedness.  Hematological: Negative.   Psychiatric/Behavioral: Negative.        Objective:   Physical Exam  Constitutional: She is oriented to person, place, and time.       Thin and cachectic appearing  HENT:  Head: Normocephalic and atraumatic.  Eyes: Conjunctivae and EOM are normal. Pupils are equal, round, and reactive to light.  Neck: Normal range of motion. Neck supple.  Cardiovascular: Normal rate, regular rhythm and normal heart sounds.   Pulmonary/Chest: Effort normal and breath sounds normal.  Abdominal: Soft. Bowel sounds are normal.       Mild right upper quadrant tenderness. The liver is enlarged.  Musculoskeletal: Normal range of motion.  Neurological: She is alert and oriented to person, place, and time.  Skin: Skin is warm and dry.  Psychiatric: She has a normal mood and affect. Her behavior is normal.      Assessment:     Granular cell tumor of the cecum    Plan:     Because of the GI bleed and anemia that she has been experiencing and the finding of the tumor in her cecum I believe she is going to require a right colectomy. I think she would be a very good laparoscopic candidate. Unfortunately I think she is a very high-risk patient because of her cirrhosis and substance abuse as well as the fact that she is on steroids. I have discussed her in detail the risks and benefits of the operation remove this tumor from her colon as well as some of the technical aspects including the risk of leak from anastomosis and she understands and wishes to proceed. I will plan to obtain lab work up found her to evaluate her liver function and coag status prior to surgery.      

## 2011-08-29 LAB — CBC
HCT: 23.1 % — ABNORMAL LOW (ref 36.0–46.0)
MCHC: 32.9 g/dL (ref 30.0–36.0)
Platelets: 149 10*3/uL — ABNORMAL LOW (ref 150–400)
RDW: 13.3 % (ref 11.5–15.5)
WBC: 17 10*3/uL — ABNORMAL HIGH (ref 4.0–10.5)

## 2011-08-29 LAB — COMPREHENSIVE METABOLIC PANEL
ALT: 12 U/L (ref 0–35)
Alkaline Phosphatase: 139 U/L — ABNORMAL HIGH (ref 39–117)
BUN: 9 mg/dL (ref 6–23)
CO2: 19 mEq/L (ref 19–32)
GFR calc Af Amer: >90 mL/min (ref >90)
GFR calc non Af Amer: >90 mL/min (ref >90)
Glucose, Bld: 149 mg/dL — ABNORMAL HIGH (ref 70–99)
Potassium: 4.6 mEq/L (ref 3.5–5.1)
Total Bilirubin: 0.3 mg/dL (ref 0.3–1.2)
Total Protein: 6.8 g/dL (ref 6.0–8.3)

## 2011-08-29 MED ORDER — CHLORHEXIDINE GLUCONATE 0.12 % MT SOLN
15.0000 mL | Freq: Two times a day (BID) | OROMUCOSAL | Status: DC
  Administered 2011-08-29 – 2011-09-03 (×10): 15 mL via OROMUCOSAL
  Filled 2011-08-29 (×7): qty 15

## 2011-08-29 MED ORDER — BIOTENE DRY MOUTH MT LIQD
15.0000 mL | Freq: Two times a day (BID) | OROMUCOSAL | Status: DC
  Administered 2011-08-29 – 2011-09-02 (×6): 15 mL via OROMUCOSAL

## 2011-08-29 MED ORDER — WHITE PETROLATUM GEL
Status: AC
  Filled 2011-08-29: qty 5

## 2011-08-29 NOTE — Progress Notes (Signed)
UR of chart complete.  

## 2011-08-29 NOTE — Progress Notes (Signed)
1 Day Post-Op  Subjective: Complains of soreness but otherwise ok  Objective: Vital signs in last 24 hours: Temp:  [97.7 F (36.5 C)-102.1 F (38.9 C)] 98.7 F (37.1 C) (03/26 0540) Pulse Rate:  [59-120] 103  (03/26 0540) Resp:  [10-22] 16  (03/26 0800) BP: (93-166)/(68-113) 93/73 mmHg (03/26 0540) SpO2:  [99 %-100 %] 100 % (03/26 0800) Last BM Date: 08/27/11  Intake/Output from previous day: 03/25 0701 - 03/26 0700 In: 2575 [I.V.:2575] Out: 875 [Urine:825; Blood:50] Intake/Output this shift:    GI: soft. incision ok. few bs  Lab Results:   Basename 08/29/11 0626  WBC 17.0*  HGB 7.6*  HCT 23.1*  PLT 149*   BMET  Basename 08/29/11 0626  NA 135  K 4.6  CL 108  CO2 19  GLUCOSE 149*  BUN 9  CREATININE 0.78  CALCIUM 6.2*   PT/INR No results found for this basename: LABPROT:2,INR:2 in the last 72 hours ABG No results found for this basename: PHART:2,PCO2:2,PO2:2,HCO3:2 in the last 72 hours  Studies/Results: No results found.  Anti-infectives: Anti-infectives     Start     Dose/Rate Route Frequency Ordered Stop   08/27/11 1008   ertapenem (INVANZ) 1 g in sodium chloride 0.9 % 50 mL IVPB        1 g 100 mL/hr over 30 Minutes Intravenous 60 min pre-op 08/27/11 1008 08/28/11 1121          Assessment/Plan: s/p Procedure(s) (LRB): LAPAROSCOPIC RIGHT COLECTOMY (Right) d/c foley Continue with ice chips Monitor hg OOB to chair  LOS: 1 day    TOTH [name],[name] Dung S 08/29/2011

## 2011-08-29 NOTE — Progress Notes (Signed)
CRITICAL VALUE ALERT  Critical value received:  Ca+ - 6.2  Date of notification:  08/29/2011  Time of notification:  0817  Critical value read back:yes  Nurse who received alert:  Nickola Major  MD notified (1st page): Dr. Carolynne Edouard  Time of first page:  0820  MD notified (2nd page): Dr. Carolynne Edouard   Time of second page: 0827  Responding MD:  Dr. Carolynne Edouard  Time MD responded: On floor at [phone] and notified

## 2011-08-29 NOTE — Progress Notes (Signed)
INITIAL ADULT NUTRITION ASSESSMENT Date: 08/29/2011   Time: 4:59 PM Reason for Assessment: health hx  ASSESSMENT: Female 56 y.o.  Dx: cecal mass  Hx:  Past Medical History  Diagnosis Date  . Anemia, B12 deficiency   . History of acute pancreatitis   . Right knee pain     No recent imaging on chart  . 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 Dr. Su Hilt, [facility]  . Hypertriglyceridemia   . GERD (gastroesophageal reflux disease)   . Vitamin d deficiency   . Subdural hematoma 02/2008    Likely 2/2 trauma from seizure from EtOH withdrawal, chronic in nature, sees Dr. Robyne Askew. Most recent CT head 10/2009 showing stable but persistent hematoma without mass effect.  . History of seizure disorder     Likely 2/2 alcohol abuse  . Hypocalcemia   . Hypomagnesemia   . Failure to thrive     Unclear etiology  . HTN (hypertension)   . Thrombocytopenia   . Anemia, macrocytic   . Hepatomegaly     On exam  . Alcohol abuse   . Joint pain   . Alcohol abuse   . Arthritis   . Vitamin d deficiency   . Menopause   . Pancreatitis   . Insomnia   . Hyperlipidemia   . Sinusitis   . Pernicious anemia   . Subdural hematoma   . Macrocytic anemia   . Hepatomegaly   . 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 Seizures     1.5 YRS  LAST ONE  . Depression   . Hepatitis     HEPATOMEGALY    Past Surgical History  Procedure Date  . Cesarean section 1983  . Esophagogastroduodenoscopy 07/11/2011    Procedure: ESOPHAGOGASTRODUODENOSCOPY (EGD);  Surgeon: Theda Belfast, MD;  Location: [facility];  Service: Endoscopy;  Laterality: N/A;  . Colonoscopy 07/11/2011    Procedure: COLONOSCOPY;  Surgeon: Theda Belfast, MD;  Location: [facility];  Service: Endoscopy;  Laterality: N/A;  . Eye surgery     LEFT EYE YRS AGO   . Rt colectomy 08/28/2011    Related Meds:  Scheduled Meds:   . alvimopan  12 mg Oral BID  . amLODipine  10 mg  Oral Daily  . antiseptic oral rinse  15 mL Mouth Rinse q12n4p  . buPROPion  100 mg Oral TID  . calcium gluconate  500 mg Oral Daily  . chlorhexidine  15 mL Mouth Rinse BID  . cyanocobalamin  1,000 mcg Intramuscular Q30 days  . FLUoxetine  10 mg Oral Daily  . gabapentin  300 mg Oral TID  . hydrochlorothiazide  25 mg Oral Daily  . morphine   Intravenous Q4H  . morphine      . predniSONE  10 mg Oral Q breakfast  . thiamine  100 mg Oral BID  . white petrolatum       Continuous Infusions:   . dextrose 5 % and 0.9 % NaCl with KCl 20 mEq/L 75 mL/hr at 08/29/11 0513   PRN Meds:.acetaminophen, diphenhydrAMINE, diphenhydrAMINE, morphine, naloxone, ondansetron (ZOFRAN) IV, ondansetron, sodium chloride   Ht:  154.9 cm  Wt:   38.4 kg  Ideal Wt:    57.5 kg % Ideal Wt: 66%  Usual Wt: variable wt hx, 38-51.8 kg over the past 1.5 years. % Usual Wt: 100% of previous wt, however pt has weighed more in the past year and  since lost.  BMI: 16.0  Food/Nutrition Related Hx: pt with cecal mass s/p colectomy  Labs:  CMP     Component Value Date/Time   NA 135 08/29/2011 0626   K 4.6 08/29/2011 0626   CL 108 08/29/2011 0626   CO2 19 08/29/2011 0626   GLUCOSE 149* 08/29/2011 0626   BUN 9 08/29/2011 0626   CREATININE 0.78 08/29/2011 0626   CREATININE 0.74 06/28/2011 1024   CALCIUM 6.2* 08/29/2011 0626   CALCIUM 5.6* 12/22/2010 1010   PROT 6.8 08/29/2011 0626   ALBUMIN 1.7* 08/29/2011 0626   AST 42* 08/29/2011 0626   ALT 12 08/29/2011 0626   ALKPHOS 139* 08/29/2011 0626   BILITOT 0.3 08/29/2011 0626   GFRNONAA >90 08/29/2011 0626   GFRAA >90 08/29/2011 0626    Intake: 0%, pt NPO Output:   Intake/Output Summary (Last 24 hours) at 08/29/11 1714 Last data filed at 08/29/11 1500  Gross per 24 hour  Intake   1949 ml  Output    625 ml  Net   1324 ml   Bowel prep PTA  Diet Order: NPO  Supplements/Tube Feeding:  None at this time  IVF:    dextrose 5 % and 0.9 % NaCl with KCl 20 mEq/L Last Rate:  75 mL/hr at 08/29/11 0513    Estimated Nutritional Needs:   Kcal: 1330-1520 kcal Protein: 57-68 g protein Fluid: ~1.2 L/day  Pt admitted post-op for cecal mass removal, colectomy.  Pt is NPO.  Review of chart reveals pt with past h/o EtOH abuse.  She was sober for a few months in last 2012 (unsure if this continues).  Pt with chronic anemia, recently found to have cecal mass which is likely affecting intake.  Pt with home Ensure regimen of TID, however past encounters report pt typically consuming 1/day.  Pt qualifies for Moderate malnutrition of chronic illness due to wasting, long-standing inability to gain/maintain optimal wt.  Pt's BMI is 16.0.  Pt not likely meeting >75% of needs with PO intake.  NUTRITION DIAGNOSIS: -Malnutrition.  Status: Ongoing  RELATED TO: alcohol abuse, cecal mass  AS EVIDENCE BY: s/p colectomy, BMI 16 with variable wt hx which shows inability to gain and maintain appropriate wt.  MONITORING/EVALUATION(Goals): 1.  Food/Beverage; diet advancement as appropriate per MD discretion. 2.  Wt/wt change; deter further loss.  Pt would benefit from wt gain and will need to continue supplementation to achieve. 3.  Plan of care   EDUCATION NEEDS: -Education not appropriate at this time  INTERVENTION: 1.  Modify diet; advance as tolerated to Regular.   2.  Supplements; currently not appropriate for diet order.  To be considered once PO diet resumed.  Dietitian #: [phone]  DOCUMENTATION CODES Per approved criteria  -Non-severe (moderate) malnutrition in the context of chronic illness    Hoyt Koch 08/29/2011, 4:59 PM

## 2011-08-30 LAB — DIFFERENTIAL
Eosinophils Absolute: 0.1 10*3/uL (ref 0.0–0.7)
Lymphs Abs: 2.1 10*3/uL (ref 0.7–4.0)
Monocytes Relative: 13 % — ABNORMAL HIGH (ref 3–12)
Neutro Abs: 8.6 10*3/uL — ABNORMAL HIGH (ref 1.7–7.7)
Neutrophils Relative %: 69 % (ref 43–77)

## 2011-08-30 LAB — BASIC METABOLIC PANEL
BUN: 8 mg/dL (ref 6–23)
Chloride: 105 mEq/L (ref 96–112)
GFR calc Af Amer: >90 mL/min (ref >90)
GFR calc non Af Amer: >90 mL/min (ref >90)
Glucose, Bld: 89 mg/dL (ref 70–99)
Potassium: 4.4 mEq/L (ref 3.5–5.1)
Sodium: 135 mEq/L (ref 135–145)

## 2011-08-30 LAB — CBC
Hemoglobin: 8.3 g/dL — ABNORMAL LOW (ref 12.0–15.0)
Platelets: 177 10*3/uL (ref 150–400)
RBC: 2.46 MIL/uL — ABNORMAL LOW (ref 3.87–5.11)
WBC: 12.5 10*3/uL — ABNORMAL HIGH (ref 4.0–10.5)

## 2011-08-30 MED ORDER — THIAMINE HCL 100 MG/ML IJ SOLN: 100.0000 mg | Freq: Every day | INTRAMUSCULAR | Status: DC

## 2011-08-30 MED ORDER — LORAZEPAM 2 MG/ML IJ SOLN
0.0000 mg | Freq: Four times a day (QID) | INTRAMUSCULAR | Status: AC
Start: 2011-08-30 — End: 2011-09-01
  Administered 2011-08-31: 2 mg via INTRAVENOUS
  Administered 2011-08-31: 0.5 mg via INTRAVENOUS
  Administered 2011-09-01: 2 mg via INTRAVENOUS
  Filled 2011-08-30: qty 1
  Filled 2011-08-30: qty 2
  Filled 2011-08-30: qty 1

## 2011-08-30 MED ORDER — LORAZEPAM 2 MG/ML IJ SOLN
0.0000 mg | Freq: Two times a day (BID) | INTRAMUSCULAR | Status: AC
  Administered 2011-09-01: 2 mg via INTRAVENOUS

## 2011-08-30 MED ORDER — MORPHINE SULFATE (PF) 1 MG/ML IV SOLN
INTRAVENOUS | Status: AC
  Filled 2011-08-30: qty 25

## 2011-08-30 MED ORDER — ADULT MULTIVITAMIN W/MINERALS CH: 1.0000 | ORAL_TABLET | Freq: Every day | ORAL | Status: DC

## 2011-08-30 MED ORDER — FOLIC ACID 1 MG PO TABS
1.0000 mg | ORAL_TABLET | Freq: Every day | ORAL | Status: DC
  Administered 2011-08-30 – 2011-09-04 (×6): 1 mg via ORAL
  Filled 2011-08-30 (×6): qty 1

## 2011-08-30 MED ORDER — LORAZEPAM 2 MG/ML IJ SOLN
1.0000 mg | Freq: Four times a day (QID) | INTRAMUSCULAR | Status: AC | PRN
  Administered 2011-08-30 – 2011-09-01 (×4): 1 mg via INTRAVENOUS
  Filled 2011-08-30 (×5): qty 1

## 2011-08-30 MED ORDER — VITAMIN B-1 100 MG PO TABS: 100.0000 mg | ORAL_TABLET | Freq: Every day | ORAL | Status: DC

## 2011-08-30 MED ORDER — LORAZEPAM 1 MG PO TABS
1.0000 mg | ORAL_TABLET | Freq: Four times a day (QID) | ORAL | Status: AC | PRN
  Administered 2011-08-30: 1 mg via ORAL
  Filled 2011-08-30: qty 1

## 2011-08-30 NOTE — Progress Notes (Addendum)
2 Days Post-Op  Subjective: Feels better today but feeling a little anxious. States she drinks a lot of alcohol at home  Objective: Vital signs in last 24 hours: Temp:  [97.7 F (36.5 C)-99.4 F (37.4 C)] 97.7 F (36.5 C) (03/27 0554) Pulse Rate:  [95-108] 103  (03/27 0554) Resp:  [16-22] 16  (03/27 0554) BP: (105-132)/(70-90) 132/90 mmHg (03/27 0554) SpO2:  [94 %-100 %] 99 % (03/27 0554) Weight:  [82 lb (37.195 kg)] 82 lb (37.195 kg) (03/26 2300) Last BM Date: 08/27/11  Intake/Output from previous day: 03/26 0701 - 03/27 0700 In: 774 [I.V.:774] Out: 275 [Urine:275] Intake/Output this shift:    GI: soft. mild tenderness. few bs. no flatus yet  Lab Results:   Basename 08/29/11 0626  WBC 17.0*  HGB 7.6*  HCT 23.1*  PLT 149*   BMET  Basename 08/29/11 0626  NA 135  K 4.6  CL 108  CO2 19  GLUCOSE 149*  BUN 9  CREATININE 0.78  CALCIUM 6.2*   PT/INR No results found for this basename: LABPROT:2,INR:2 in the last 72 hours ABG No results found for this basename: PHART:2,PCO2:2,PO2:2,HCO3:2 in the last 72 hours  Studies/Results: No results found.  Anti-infectives: Anti-infectives     Start     Dose/Rate Route Frequency Ordered Stop   08/27/11 1008   ertapenem (INVANZ) 1 g in sodium chloride 0.9 % 50 mL IVPB        1 g 100 mL/hr over 30 Minutes Intravenous 60 min pre-op 08/27/11 1008 08/28/11 1121          Assessment/Plan: s/p Procedure(s) (LRB): LAPAROSCOPIC RIGHT COLECTOMY (Right) UOP not accurate as pt has been voiding well in bedside commode Start CIWA protocol for withdrawal OOB to chair Check hg and lytes today  LOS: 2 days    TOTH III,Toini Failla S 08/30/2011

## 2011-08-30 NOTE — Clinical Documentation Improvement (Signed)
MALNUTRITION DOCUMENTATION CLARIFICATION  THIS DOCUMENT IS NOT A PERMANENT PART OF THE MEDICAL RECORD  TO RESPOND TO THE THIS QUERY, FOLLOW THE INSTRUCTIONS BELOW:  1. If needed, update documentation for the patient's encounter via the notes activity.  2. Access this query again and click edit on the In [employer].  3. After updating, or not, click F2 to complete all highlighted (required) fields concerning your review. Select "additional documentation in the medical record" OR "no additional documentation provided".  4. Click Sign note button.  5. The deficiency will fall out of your In Basket *Please let us know if you are not able to complete this workflow by phone or e-mail (listed below).  Please update your documentation within the medical record to reflect your response to this query.                                                                                        08/30/11   Dear Dr. Chevis Pretty / Associates,  In a better effort to capture your patient's severity of illness, reflect appropriate length of stay and utilization of resources, a review of the patient medical record has revealed the following indicators.    Based on your clinical judgment, please clarify and document in a progress note and/or discharge summary the clinical condition associated with the following supporting information:  You may use possible, probable, or suspect with inpatient documentation. possible, probable, suspected diagnoses MUST be documented at the time of discharge  In responding to this query please exercise your independent judgment.  The fact that a query is asked, does not imply that any particular answer is desired or expected.  Possible Clinical Conditions?  _______Mild Malnutrition  _______Moderate Malnutrition _______Severe Malnutrition   _______Protein Calorie Malnutrition _______Severe Protein Calorie Malnutrition _______Emaciation  _______Cachexia   _______Other  Condition________________ _______Cannot clinically determine     Supporting Information: Risk Factors: Per 3/ 26 RD note  pt with cecal mass s/p colectomy.    Signs & Symptoms:  Ht 154.9 cm Wt 38.4 kg BMI:  16.0   Usual Wt: variable wt hx, 38-51.8 kg over the past 1.5 years.  Diagnostics:  Component     Latest Ref Rng 08/28/2011 08/29/2011 08/30/2011  Calcium     8.4 - 10.5 mg/dL  6.2 (LL) 7.0 (L)  Total Protein     6.0 - 8.3 g/dL  6.8   Albumin     3.5 - 5.2 g/dL  1.7 (L)     Nutrition Consult: completed on 3/25.   Reviewed:  no additional documentation provided  Thank You,  Shelda Pal RN, BSN, CCM   Clinical Documentation Specialist:  Pager [phone]   Health Information Management 

## 2011-08-31 LAB — GLUCOSE, CAPILLARY: Glucose-Capillary: 97 mg/dL (ref 70–99)

## 2011-08-31 MED ORDER — MORPHINE SULFATE 4 MG/ML IJ SOLN
4.0000 mg | INTRAMUSCULAR | Status: DC | PRN
  Administered 2011-08-31: 1 mg via INTRAVENOUS
  Filled 2011-08-31: qty 1

## 2011-08-31 NOTE — Significant Event (Signed)
Rapid Response Event Note  Overview: called by bedside RN to assess patient's low temp Time Called: 0216 Arrival Time: 0220 Event Type: Other (Comment) (low temp)  Initial Focused Asssesment: Upon arrival, patient alert and oriented lying in bed, vital signs taken and documented in flowsheets. Patient has no complaints, skin warm and dry, rectal and oral temp obtained Interventions:  Will continue to monitor Event Summary:   at      at    Outcome: Other (Comment) (no interventions needed)  Event End Time: 0230  Burna Forts, Isidoro Donning

## 2011-08-31 NOTE — Progress Notes (Signed)
3 Days Post-Op  Subjective: Wants something to eat. No flatus yet  Objective: Vital signs in last 24 hours: Temp:  [94.5 F (34.7 C)-98.1 F (36.7 C)] 97.5 F (36.4 C) (03/28 0954) Pulse Rate:  [89-116] 114  (03/28 1205) Resp:  [14-20] 17  (03/28 1205) BP: (120-147)/(81-98) 140/96 mmHg (03/28 1205) SpO2:  [95 %-100 %] 95 % (03/28 1205) Last BM Date: 08/27/11  Intake/Output from previous day: 03/27 0701 - 03/28 0700 In: 1320 [P.O.:120; I.V.:1200] Out: 1100 [Urine:1100] Intake/Output this shift:    GI: soft, mildly tender. incisions look good. few bs  Lab Results:   [facility] 08/30/11 0620 08/29/11 0626  WBC 12.5* 17.0*  HGB 8.3* 7.6*  HCT 25.5* 23.1*  PLT 177 149*   BMET  Basename 08/30/11 0620 08/29/11 0626  NA 135 135  K 4.4 4.6  CL 105 108  CO2 20 19  GLUCOSE 89 149*  BUN 8 9  CREATININE 0.68 0.78  CALCIUM 7.0* 6.2*   PT/INR No results found for this basename: LABPROT:2,INR:2 in the last 72 hours ABG No results found for this basename: PHART:2,PCO2:2,PO2:2,HCO3:2 in the last 72 hours  Studies/Results: No results found.  Anti-infectives: Anti-infectives     Start     Dose/Rate Route Frequency Ordered Stop   08/27/11 1008   ertapenem (INVANZ) 1 g in sodium chloride 0.9 % 50 mL IVPB        1 g 100 mL/hr over 30 Minutes Intravenous 60 min pre-op 08/27/11 1008 08/28/11 1121          Assessment/Plan: s/p Procedure(s) (LRB): LAPAROSCOPIC RIGHT COLECTOMY (Right) Await flatus before advancing diet Ambulate  LOS: 3 days    TOTH III,Charlton Boule S 08/31/2011

## 2011-08-31 NOTE — Progress Notes (Addendum)
Patient was found sitting on the floor by NT. RN assessed patient and found no bruising or broken skin. Patient alert and oriented, assessment unchanged from this am. No c/o pain per patient.  MD on call made aware, no new orders at this time. RN will continue to monitor.   Patient's daughter notified and understanding of situation.

## 2011-09-01 NOTE — Progress Notes (Signed)
  General Surgery Note  LOS: 4 days  POD# 4 Room - 5118  Assessment/Plan: 1.  LAPAROSCOPIC RIGHT COLECTOMY - Royston Sinner - 08/28/2011  She is doing okay.  A little hard to judge her progress.  On Entereg.  2.  Withdrawal protocol.  Though alert and conversant right now. 3.  Fell yesterday.  Has a Comptroller. 4.  Cachetic looking - chronic. 5.  DVT proph - stockings.  I would be hesitant to put someone who fell on chemoprophylaxis. 6.  On prednisone.  Subjective:  Doing okay.  No nausea or vomiting. Objective:   Filed Vitals:   09/01/11 0730  BP: 140/93  Pulse: 106  Temp: 97.3 F (36.3 C)  Resp: 18     Intake/Output from previous day:  03/28 0701 - 03/29 0700 In: 315 [P.O.:240; I.V.:75] Out: 750 [Urine:750]  Intake/Output this shift:  Total I/O In: -  Out: 850 [Urine:850]   Physical Exam:   General: Thin AA F who is alert.    HEENT: Normal. Pupils equal. Good dentition. .   Lungs: Clear.   Abdomen: Mild distention.   Wound: Okay.   Neurologic:  Grossly intact to motor and sensory function.   Psychiatric: Has normal mood and affect. Behavior is normal   Lab Results:    Basename 08/30/11 0620  WBC 12.5*  HGB 8.3*  HCT 25.5*  PLT 177    BMET   Basename 08/30/11 0620  NA 135  K 4.4  CL 105  CO2 20  GLUCOSE 89  BUN 8  CREATININE 0.68  CALCIUM 7.0*    PT/INR  No results found for this basename: LABPROT:2,INR:2 in the last 72 hours  ABG  No results found for this basename: PHART:2,PCO2:2,PO2:2,HCO3:2 in the last 72 hours   Studies/Results:  No results found.   Anti-infectives:   Anti-infectives     Start     Dose/Rate Route Frequency Ordered Stop   08/27/11 1008   ertapenem (INVANZ) 1 g in sodium chloride 0.9 % 50 mL IVPB        1 g 100 mL/hr over 30 Minutes Intravenous 60 min pre-op 08/27/11 1008 08/28/11 1121          Ovidio Kin, MD, FACS Pager: [phone],   [facility] Office: [phone] 09/01/2011

## 2011-09-02 LAB — BASIC METABOLIC PANEL
CO2: 17 mEq/L — ABNORMAL LOW (ref 19–32)
Calcium: 7.4 mg/dL — ABNORMAL LOW (ref 8.4–10.5)
Chloride: 111 mEq/L (ref 96–112)
GFR calc Af Amer: >90 mL/min (ref >90)
Sodium: 137 mEq/L (ref 135–145)

## 2011-09-02 LAB — CBC
MCHC: 31.9 g/dL (ref 30.0–36.0)
Platelets: 265 10*3/uL (ref 150–400)
RDW: 13.4 % (ref 11.5–15.5)
WBC: 4.9 10*3/uL (ref 4.0–10.5)

## 2011-09-02 LAB — DIFFERENTIAL
Basophils Absolute: 0 10*3/uL (ref 0.0–0.1)
Basophils Relative: 0 % (ref 0–1)
Eosinophils Relative: 3 % (ref 0–5)
Lymphocytes Relative: 30 % (ref 12–46)
Neutro Abs: 2.5 10*3/uL (ref 1.7–7.7)

## 2011-09-02 MED ORDER — HYDROCODONE-ACETAMINOPHEN 5-325 MG PO TABS
1.0000 | ORAL_TABLET | Freq: Four times a day (QID) | ORAL | Status: DC | PRN
  Administered 2011-09-03: 1 via ORAL
  Filled 2011-09-02: qty 1

## 2011-09-02 NOTE — Progress Notes (Signed)
  General Surgery Note  LOS: 5 days  POD# 5 Room - 5118  Assessment/Plan: 1.  LAPAROSCOPIC RIGHT COLECTOMY - Royston Sinner - 08/28/2011  She is doing okay.  Mentally more alert today.  On Entereg.  Has passed flatus.  Will start clear liquids.  2.  Withdrawal protocol.  Though alert and conversant right now. 3.  Fell yesterday.  Has a Comptroller. 4.  Cachetic looking - chronic. 5.  DVT proph - stockings.  I would be hesitant to put someone who fell on chemoprophylaxis. 6.  On prednisone.  ? Reason. 7.  Anemia - Hgb 8.2 - 09/02/2011  Subjective:  Doing okay.  Passing flatus.  Looks much better today. Objective:   Filed Vitals:   09/02/11 0811  BP: 132/90  Pulse: 90  Temp:   Resp:      Intake/Output from previous day:  03/29 0701 - 03/30 0700 In: 1370 [P.O.:120; I.V.:1250] Out: 2450 [Urine:2450]  Intake/Output this shift:  Total I/O In: 1 [Other:1] Out: 600 [Urine:600]   Physical Exam:   General: Thin AA F who is alert.    HEENT: Normal. Pupils equal.  .   Lungs: Clear.   Abdomen: Much less distended.  BS present.   Wound: Okay.   Neurologic:  Grossly intact to motor and sensory function.  But needs a lot of help to stand.   Psychiatric: More conversant and oriented today.   Lab Results:     Basename 09/02/11 0630  WBC 4.9  HGB 8.2*  HCT 25.7*  PLT 265    BMET    Basename 09/02/11 0630  NA 137  K 5.0  CL 111  CO2 17*  GLUCOSE 78  BUN 3*  CREATININE 0.65  CALCIUM 7.4*    PT/INR  No results found for this basename: LABPROT:2,INR:2 in the last 72 hours  ABG  No results found for this basename: PHART:2,PCO2:2,PO2:2,HCO3:2 in the last 72 hours   Studies/Results:  No results found.   Anti-infectives:   Anti-infectives     Start     Dose/Rate Route Frequency Ordered Stop   08/27/11 1008   ertapenem (INVANZ) 1 g in sodium chloride 0.9 % 50 mL IVPB        1 g 100 mL/hr over 30 Minutes Intravenous 60 min pre-op 08/27/11 1008 08/28/11 1121           Ovidio Kin, MD, FACS Pager: [phone],   [facility]: [phone] 09/02/2011

## 2011-09-02 NOTE — Plan of Care (Signed)
Problem: Phase II Progression Outcomes Goal: Tolerating diet Outcome: Not Met (add Reason) No diet ordered to this date. Patient is still npo

## 2011-09-03 NOTE — Progress Notes (Signed)
  General Surgery Note  LOS: 6 days  POD# 6 Room - 5118  Assessment/Plan: 1.  LAPAROSCOPIC RIGHT COLECTOMY - Royston Sinner - 08/28/2011  She is doing okay.  Mentally more alert today.  Tolerated clr liquids, to advance to reg diet.  2.  Withdrawal protocol.  Having no problems now. 3.  Fell yesterday.  Has a Comptroller. 4.  Cachetic looking - chronic. 5.  DVT proph - stockings.  I would be hesitant to put someone who fell on chemoprophylaxis. 6.  On prednisone.  ? Reason. 7.  Anemia - Hgb 8.2 - 09/02/2011  Subjective:  Doing well, tolerated clear liquids.  Was walking in hall. Objective:   Filed Vitals:   09/03/11 0603  BP: 110/79  Pulse: 101  Temp: 98.2 F (36.8 C)  Resp: 18     Intake/Output from previous day:  03/30 0701 - 03/31 0700 In: 3536.7 [P.O.:960; I.V.:2575.7] Out: 1650 [Urine:1650]  Intake/Output this shift:  Total I/O In: 400 [P.O.:400] Out: 800 [Urine:800]   Physical Exam:   General: Thin AA F who is alert.    HEENT: Normal. Pupils equal.  .   Lungs: Clear.   Abdomen: Soft.  BS present.   Wound: Okay.   Neurologic:  Grossly intact to motor and sensory function.  Was walking in the hall today.   Psychiatric: Looks good mentally.   Lab Results:     Basename 09/02/11 0630  WBC 4.9  HGB 8.2*  HCT 25.7*  PLT 265    BMET    Basename 09/02/11 0630  NA 137  K 5.0  CL 111  CO2 17*  GLUCOSE 78  BUN 3*  CREATININE 0.65  CALCIUM 7.4*    PT/INR  No results found for this basename: LABPROT:2,INR:2 in the last 72 hours  ABG  No results found for this basename: PHART:2,PCO2:2,PO2:2,HCO3:2 in the last 72 hours   Studies/Results:  No results found.   Anti-infectives:   Anti-infectives     Start     Dose/Rate Route Frequency Ordered Stop   08/27/11 1008   ertapenem (INVANZ) 1 g in sodium chloride 0.9 % 50 mL IVPB        1 g 100 mL/hr over 30 Minutes Intravenous 60 min pre-op 08/27/11 1008 08/28/11 1121         Ovidio Kin, MD, FACS Pager:  [phone],   [facility] Office: [phone] 09/03/2011

## 2011-09-04 NOTE — Progress Notes (Signed)
7 Days Post-Op  Subjective: No complaints  Objective: Vital signs in last 24 hours: Temp:  [98 F (36.7 C)-99 F (37.2 C)] 99 F (37.2 C) (04/01 1343) Pulse Rate:  [105-111] 111  (04/01 1343) Resp:  [17-19] 19  (04/01 1343) BP: (115-129)/(76-89) 129/87 mmHg (04/01 1343) SpO2:  [99 %-100 %] 99 % (04/01 1343) Last BM Date: 09/03/11  Intake/Output from previous day: 03/31 0701 - 04/01 0700 In: 1000 [P.O.:760; I.V.:240] Out: 1300 [Urine:1300] Intake/Output this shift:    GI: soft, nontender. incisions look good  Lab Results:   Basename 09/02/11 0630  WBC 4.9  HGB 8.2*  HCT 25.7*  PLT 265   BMET  Basename 09/02/11 0630  NA 137  K 5.0  CL 111  CO2 17*  GLUCOSE 78  BUN 3*  CREATININE 0.65  CALCIUM 7.4*   PT/INR No results found for this basename: LABPROT:2,INR:2 in the last 72 hours ABG No results found for this basename: PHART:2,PCO2:2,PO2:2,HCO3:2 in the last 72 hours  Studies/Results: No results found.  Anti-infectives: Anti-infectives     Start     Dose/Rate Route Frequency Ordered Stop   08/27/11 1008   ertapenem (INVANZ) 1 g in sodium chloride 0.9 % 50 mL IVPB        1 g 100 mL/hr over 30 Minutes Intravenous 60 min pre-op 08/27/11 1008 08/28/11 1121          Assessment/Plan: [name]/p Procedure([name]) (LRB): LAPAROSCOPIC RIGHT COLECTOMY (Right) Discharge  LOS: 7 days    [name] [name],[name] [name] 09/04/2011

## 2011-09-04 NOTE — Discharge Summary (Signed)
Physician Discharge Summary  [name] MRN: [id number] DOB/AGE: 1956-05-28 56 y.o.  Admit date: 08/28/2011 Discharge date: 09/04/2011  Admission Diagnoses:  Discharge Diagnoses:  Active Problems:  * No active hospital problems. *    Discharged Condition: good  Hospital Course: the pt underwent laparoscopic right colectomy. She tolerated surgery well. Postop she had some issues with withdrawal and ileus but this resolved and she is now ready for discharge  Consults: None  Significant Diagnostic Studies: none  Treatments: surgery: lap right colectomy  Discharge Exam: Blood pressure 129/87, pulse 111, temperature 99 F (37.2 C), temperature source Oral, resp. rate 19, height 5\' 1"  (1.549 m), weight 82 lb (37.195 kg), SpO2 99.00%. GI: soft, nontender  Disposition: 01-Home or Self Care  Discharge Orders    Future Appointments: Provider: Department: Dept Phone: Center:   09/28/2011 3:30 PM Robyne Askew, MD [facility] [phone] None     Medication List  As of 09/04/2011  5:21 PM   ASK your doctor about these medications         amLODipine 10 MG tablet   Commonly known as: NORVASC   Take 10 mg by mouth daily.      amylase-lipase-protease 20-4.5-25 MU per capsule   Commonly known as: PANGESTYME EC   Take by mouth 3 (three) times daily with meals.      buPROPion 100 MG tablet   Commonly known as: WELLBUTRIN   Take 100 mg by mouth 3 (three) times daily.      calcium gluconate 500 MG tablet   Take 500 mg by mouth daily.      cyanocobalamin 1000 MCG/ML injection   Commonly known as: (VITAMIN B-12)   Inject 1,000 mcg into the muscle every 30 (thirty) days. Last day of month. Last dose last month      ergocalciferol 50000 UNITS capsule   Commonly known as: VITAMIN D2   Take 50,000 Units by mouth once a week. thursdays      FLUoxetine 10 MG capsule   Commonly known as: PROZAC   Take 10 mg by mouth daily.      gabapentin 300 MG capsule   Commonly known as: NEURONTIN   Take 300 mg by mouth 3 (three) times daily.      hydrochlorothiazide 25 MG tablet   Commonly known as: HYDRODIURIL   Take 25 mg by mouth daily.      ibuprofen 100 MG tablet   Commonly known as: ADVIL,MOTRIN   Take 200 mg by mouth 2 (two) times daily as needed. For pain.      magnesium oxide 400 MG tablet   Commonly known as: MAG-OX   Take 400 mg by mouth 2 (two) times daily.      predniSONE 10 MG tablet   Commonly known as: DELTASONE   Take 10 mg by mouth daily. Takes 5 tablets the first day, then 4 tabs day 2, then 3 tabs day 3, then 2 tabs day 4, then 1 tab day 5 and continue 1 tab daily thereafter.   Started on 11/4.      promethazine 12.5 MG tablet   Commonly known as: PHENERGAN   Take 12.5 mg by mouth 4 (four) times daily as needed. For nausea      thiamine 100 MG tablet   Commonly known as: VITAMIN B-1   Take 100 mg by mouth 2 (two) times daily.      diclofenac sodium 1 % Gel   Commonly known as:  VOLTAREN   Apply 1 application topically 4 (four) times daily.      VOLTAREN 1 % Gel   Generic drug: diclofenac sodium   APPLY 1 APPLICATION TOPICALLY 4 (FOUR) TIMES DAILY.             SignedGriselda Miner S 09/04/2011, 5:21 PM

## 2011-09-05 ENCOUNTER — Emergency Department (HOSPITAL_COMMUNITY): Payer: PRIVATE HEALTH INSURANCE

## 2011-09-05 ENCOUNTER — Encounter (HOSPITAL_COMMUNITY): Payer: Self-pay | Admitting: Emergency Medicine

## 2011-09-05 ENCOUNTER — Other Ambulatory Visit: Payer: Self-pay

## 2011-09-05 ENCOUNTER — Other Ambulatory Visit: Payer: Self-pay | Admitting: Internal Medicine

## 2011-09-05 ENCOUNTER — Inpatient Hospital Stay (HOSPITAL_COMMUNITY)
Admission: EM | Admit: 2011-09-05 | Discharge: 2011-09-07 | DRG: 100 | Disposition: A | Discharge status: Home / Self Care | Payer: PRIVATE HEALTH INSURANCE | Attending: Internal Medicine | Admitting: Internal Medicine

## 2011-09-05 ENCOUNTER — Inpatient Hospital Stay (HOSPITAL_COMMUNITY): Payer: PRIVATE HEALTH INSURANCE

## 2011-09-05 DIAGNOSIS — Z888 Allergy status to other drugs, medicaments and biological substances status: Secondary | ICD-10-CM

## 2011-09-05 DIAGNOSIS — F32A Depression, unspecified: Secondary | ICD-10-CM | POA: Diagnosis present

## 2011-09-05 DIAGNOSIS — D518 Other vitamin B12 deficiency anemias: Secondary | ICD-10-CM | POA: Insufficient documentation

## 2011-09-05 DIAGNOSIS — I10 Essential (primary) hypertension: Secondary | ICD-10-CM | POA: Diagnosis present

## 2011-09-05 DIAGNOSIS — E781 Pure hyperglyceridemia: Secondary | ICD-10-CM | POA: Diagnosis present

## 2011-09-05 DIAGNOSIS — E878 Other disorders of electrolyte and fluid balance, not elsewhere classified: Secondary | ICD-10-CM | POA: Diagnosis present

## 2011-09-05 DIAGNOSIS — E41 Nutritional marasmus: Secondary | ICD-10-CM | POA: Diagnosis present

## 2011-09-05 DIAGNOSIS — K861 Other chronic pancreatitis: Secondary | ICD-10-CM | POA: Diagnosis present

## 2011-09-05 DIAGNOSIS — Z8669 Personal history of other diseases of the nervous system and sense organs: Secondary | ICD-10-CM

## 2011-09-05 DIAGNOSIS — N2581 Secondary hyperparathyroidism of renal origin: Secondary | ICD-10-CM | POA: Diagnosis present

## 2011-09-05 DIAGNOSIS — Z79899 Other long term (current) drug therapy: Secondary | ICD-10-CM

## 2011-09-05 DIAGNOSIS — K219 Gastro-esophageal reflux disease without esophagitis: Secondary | ICD-10-CM | POA: Diagnosis present

## 2011-09-05 DIAGNOSIS — R627 Adult failure to thrive: Secondary | ICD-10-CM | POA: Diagnosis present

## 2011-09-05 DIAGNOSIS — F3289 Other specified depressive episodes: Secondary | ICD-10-CM | POA: Diagnosis present

## 2011-09-05 DIAGNOSIS — F329 Major depressive disorder, single episode, unspecified: Secondary | ICD-10-CM | POA: Diagnosis present

## 2011-09-05 DIAGNOSIS — I129 Hypertensive chronic kidney disease with stage 1 through stage 4 chronic kidney disease, or unspecified chronic kidney disease: Secondary | ICD-10-CM | POA: Diagnosis present

## 2011-09-05 DIAGNOSIS — R443 Hallucinations, unspecified: Secondary | ICD-10-CM

## 2011-09-05 DIAGNOSIS — R160 Hepatomegaly, not elsewhere classified: Secondary | ICD-10-CM | POA: Diagnosis present

## 2011-09-05 DIAGNOSIS — M129 Arthropathy, unspecified: Secondary | ICD-10-CM | POA: Diagnosis present

## 2011-09-05 DIAGNOSIS — Z6379 Other stressful life events affecting family and household: Secondary | ICD-10-CM

## 2011-09-05 DIAGNOSIS — Z681 Body mass index (BMI) 19 or less, adult: Secondary | ICD-10-CM

## 2011-09-05 DIAGNOSIS — Z87891 Personal history of nicotine dependence: Secondary | ICD-10-CM

## 2011-09-05 DIAGNOSIS — G40909 Epilepsy, unspecified, not intractable, without status epilepticus: Principal | ICD-10-CM | POA: Diagnosis present

## 2011-09-05 DIAGNOSIS — F1010 Alcohol abuse, uncomplicated: Secondary | ICD-10-CM | POA: Diagnosis not present

## 2011-09-05 LAB — DIFFERENTIAL
Basophils Absolute: 0 10*3/uL (ref 0.0–0.1)
Basophils Relative: 0 % (ref 0–1)
Lymphocytes Relative: 17 % (ref 12–46)
Monocytes Relative: 11 % (ref 3–12)
Neutro Abs: 6.5 10*3/uL (ref 1.7–7.7)
Neutrophils Relative %: 70 % (ref 43–77)

## 2011-09-05 LAB — URINALYSIS, ROUTINE W REFLEX MICROSCOPIC
Glucose, UA: NEGATIVE mg/dL
pH: 6.5 (ref 5.0–8.0)

## 2011-09-05 LAB — CBC
Hemoglobin: 8.6 g/dL — ABNORMAL LOW (ref 12.0–15.0)
MCHC: 33.3 g/dL (ref 30.0–36.0)
RBC: 2.57 MIL/uL — ABNORMAL LOW (ref 3.87–5.11)
RDW: 13.2 % (ref 11.5–15.5)

## 2011-09-05 LAB — RAPID URINE DRUG SCREEN, HOSP PERFORMED
Barbiturates: NOT DETECTED
Tetrahydrocannabinol: NOT DETECTED

## 2011-09-05 LAB — URINE MICROSCOPIC-ADD ON

## 2011-09-05 LAB — BASIC METABOLIC PANEL
CO2: 18 mEq/L — ABNORMAL LOW (ref 19–32)
Calcium: 6.6 mg/dL — ABNORMAL LOW (ref 8.4–10.5)
Chloride: 102 mEq/L (ref 96–112)
Creatinine, Ser: 0.98 mg/dL (ref 0.50–1.10)
Glucose, Bld: 128 mg/dL — ABNORMAL HIGH (ref 70–99)
Sodium: 137 mEq/L (ref 135–145)

## 2011-09-05 LAB — HEPATIC FUNCTION PANEL
AST: 22 U/L (ref 0–37)
Albumin: 2.4 g/dL — ABNORMAL LOW (ref 3.5–5.2)

## 2011-09-05 MED ORDER — SODIUM CHLORIDE 0.9 % IJ SOLN
3.0000 mL | Freq: Two times a day (BID) | INTRAMUSCULAR | Status: DC
  Administered 2011-09-05 – 2011-09-07 (×4): 3 mL via INTRAVENOUS

## 2011-09-05 MED ORDER — THIAMINE HCL 100 MG/ML IJ SOLN: 100.0000 mg | Freq: Every day | INTRAMUSCULAR | Status: DC

## 2011-09-05 MED ORDER — AMYLASE-LIPASE-PROTEASE 20-4.5-25 MU PO CPEP: 1.0000 | ORAL_CAPSULE | Freq: Three times a day (TID) | ORAL | Status: DC

## 2011-09-05 MED ORDER — SODIUM CHLORIDE 0.9 % IV BOLUS (SEPSIS)
1000.0000 mL | Freq: Once | INTRAVENOUS | Status: AC
  Administered 2011-09-05: 1000 mL via INTRAVENOUS

## 2011-09-05 MED ORDER — MAGNESIUM OXIDE 400 MG PO TABS
400.0000 mg | ORAL_TABLET | Freq: Two times a day (BID) | ORAL | Status: DC
  Administered 2011-09-05 – 2011-09-07 (×4): 400 mg via ORAL
  Filled 2011-09-05 (×5): qty 1

## 2011-09-05 MED ORDER — VITAMIN B-1 100 MG PO TABS
100.0000 mg | ORAL_TABLET | Freq: Two times a day (BID) | ORAL | Status: DC
  Administered 2011-09-05 – 2011-09-07 (×4): 100 mg via ORAL
  Filled 2011-09-05 (×5): qty 1

## 2011-09-05 MED ORDER — MAGNESIUM SULFATE 40 MG/ML IJ SOLN
2.0000 g | Freq: Once | INTRAMUSCULAR | Status: AC
  Administered 2011-09-06: 2 g via INTRAVENOUS
  Filled 2011-09-05 (×2): qty 50

## 2011-09-05 MED ORDER — SODIUM CHLORIDE 0.9 % IV SOLN: Freq: Once | INTRAVENOUS | Status: DC

## 2011-09-05 MED ORDER — CALCIUM GLUCONATE 500 MG PO TABS
500.0000 mg | ORAL_TABLET | Freq: Every day | ORAL | Status: DC
  Administered 2011-09-06 – 2011-09-07 (×3): 500 mg via ORAL
  Filled 2011-09-05 (×3): qty 1

## 2011-09-05 MED ORDER — FOLIC ACID 1 MG PO TABS
1.0000 mg | ORAL_TABLET | Freq: Every day | ORAL | Status: DC
  Administered 2011-09-05 – 2011-09-07 (×3): 1 mg via ORAL
  Filled 2011-09-05 (×3): qty 1

## 2011-09-05 MED ORDER — LORAZEPAM 2 MG/ML IJ SOLN: 1.0000 mg | Freq: Four times a day (QID) | INTRAMUSCULAR | Status: DC | PRN

## 2011-09-05 MED ORDER — VITAMIN B-1 100 MG PO TABS: 100.0000 mg | ORAL_TABLET | Freq: Every day | ORAL | Status: DC

## 2011-09-05 MED ORDER — LORAZEPAM 1 MG PO TABS
1.0000 mg | ORAL_TABLET | Freq: Four times a day (QID) | ORAL | Status: DC | PRN
  Filled 2011-09-05: qty 1

## 2011-09-05 MED ORDER — ADULT MULTIVITAMIN W/MINERALS CH
1.0000 | ORAL_TABLET | Freq: Every day | ORAL | Status: DC
  Administered 2011-09-05 – 2011-09-07 (×3): 1 via ORAL
  Filled 2011-09-05 (×3): qty 1

## 2011-09-05 MED ORDER — PANCRELIPASE (LIP-PROT-AMYL) 12000-38000 UNITS PO CPEP
1.0000 | ORAL_CAPSULE | Freq: Three times a day (TID) | ORAL | Status: DC
  Administered 2011-09-06 – 2011-09-07 (×5): 1 via ORAL
  Filled 2011-09-05 (×7): qty 1

## 2011-09-05 MED ORDER — AMLODIPINE BESYLATE 10 MG PO TABS
10.0000 mg | ORAL_TABLET | Freq: Every day | ORAL | Status: DC
  Administered 2011-09-06 – 2011-09-07 (×2): 10 mg via ORAL
  Filled 2011-09-05 (×2): qty 1

## 2011-09-05 MED ORDER — SODIUM CHLORIDE 0.9 % IV SOLN
INTRAVENOUS | Status: AC
  Administered 2011-09-05: 23:00:00 via INTRAVENOUS

## 2011-09-05 MED ORDER — FLUOXETINE HCL 10 MG PO CAPS
10.0000 mg | ORAL_CAPSULE | Freq: Every day | ORAL | Status: DC
  Administered 2011-09-05 – 2011-09-07 (×3): 10 mg via ORAL
  Filled 2011-09-05 (×3): qty 1

## 2011-09-05 NOTE — H&P (Signed)
Hospital Admission Note Date: 09/05/2011  Patient name:  [name] [name]  Medical record number:  [id number] Date of birth:  1955/12/30  Age: 56 y.o. Gender:  female PCP:    Deatra Robinson, MD, MD  Medical Service:   Internal Medicine Teaching Service   Attending physician:  Dr. Margarito Liner First Contact:   Dr. Kristie Cowman Pager: [phone]  Second Contact:   Dr. Rosana Berger Pager: [phone] After Hours:    First Contact   Pager: [phone]      Second Contact  Pager: [phone]   Chief Complaint: seizure  History of Present Illness: Patient is a 56 y.o. 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 Pt presents per EMS with daughter and sister providing most of history of present illness.  They report that patient had been in her normal state of health since hospital discharge 09/04/11 when about 8 am she fell to floor and "had a seizure".  The episode lasted less than 1 minute, EMS was called but pt refused transport to ED.  She reportedly returned to her normal baseline until about 2 pm when she suffered a second seizure episode while lying on the couch.  Her sister called EMS and the pt was able to be convinced to be transported to the ED.  Pt reports that she has been without alcohol for over a week and has never had "seizure medicine".  Current Outpatient Medications: Current Facility-Administered Medications  Medication Dose Route Frequency Provider Last Rate Last Dose  . 0.9 %  sodium chloride infusion   Intravenous Once Toy Baker, MD      . cyanocobalamin ((VITAMIN B-12)) injection 1,000 mcg  1,000  mcg Intramuscular Q30 days Denna Haggard, MD   1,000 mcg at 06/28/11 [phone]  . sodium chloride 0.9 % bolus 1,000 mL  1,000 mL Intravenous Once Otilio Miu, PA   1,000 mL at 09/05/11 1719   Current Outpatient Prescriptions  Medication Sig Dispense Refill  . amLODipine (NORVASC) 10 MG tablet Take 10 mg by mouth daily.      Marland Kitchen amylase-lipase-protease (PANGESTYME EC) 20-4.5-25 MU per capsule Take by mouth 3 (three) times daily with meals.       Marland Kitchen buPROPion (WELLBUTRIN) 100 MG tablet Take 100 mg by mouth 3 (three) times daily.       . calcium gluconate 500 MG tablet Take 500 mg by mouth daily.      . cyanocobalamin (,VITAMIN B-12,) 1000 MCG/ML injection Inject 1,000 mcg into the muscle every 30 (thirty) days. Last day of month. Last dose last month      . diclofenac sodium (VOLTAREN) 1 % GEL Apply 1 application topically 4 (four) times daily.      . ergocalciferol (VITAMIN D2) 50000 UNITS capsule Take 50,000 Units by mouth once a week. thursdays      . FLUoxetine (PROZAC) 10 MG capsule Take 10 mg by mouth daily.       Marland Kitchen gabapentin (NEURONTIN) 300 MG capsule Take 300 mg by mouth 3 (three) times daily.      . hydrochlorothiazide (HYDRODIURIL) 25 MG tablet Take 25 mg by mouth daily.      Marland Kitchen ibuprofen (  ADVIL,MOTRIN) 100 MG tablet Take 200 mg by mouth 2 (two) times daily as needed. For pain.      . magnesium oxide (MAG-OX) 400 MG tablet Take 400 mg by mouth 2 (two) times daily.      . predniSONE (DELTASONE) 10 MG tablet Take 10 mg by mouth daily. Takes 5 tablets the first day, then 4 tabs day 2, then 3 tabs day 3, then 2 tabs day 4, then 1 tab day 5 and continue 1 tab daily thereafter.   Started on 11/4.      Marland Kitchen promethazine (PHENERGAN) 12.5 MG tablet Take 12.5 mg by mouth 4 (four) times daily as needed. For nausea      . thiamine (VITAMIN B-1) 100 MG tablet Take 100 mg by mouth 2 (two) times daily.       Marland Kitchen DISCONTD: promethazine (PHENERGAN) 12.5 MG tablet TAKE 1 TABLET (12.5 MG TOTAL) BY MOUTH  EVERY 6 (SIX) HOURS AS NEEDED FOR NAUSEA.  30 tablet  0   Facility-Administered Medications Ordered in Other Encounters  Medication Dose Route Frequency Provider Last Rate Last Dose  . DISCONTD: acetaminophen (TYLENOL) suppository 650 mg  650 mg Rectal Q6H PRN Atilano Ina, MD,FACS   650 mg at 08/28/11 2311  . DISCONTD: amLODipine (NORVASC) tablet 10 mg  10 mg Oral Daily Caleen Essex III, MD   10 mg at 09/04/11 1106  . DISCONTD: buPROPion (WELLBUTRIN) tablet 100 mg  100 mg Oral TID Caleen Essex III, MD   100 mg at 09/04/11 1720  . DISCONTD: calcium gluconate tablet 500 mg  500 mg Oral Daily Caleen Essex III, MD   500 mg at 09/04/11 1105  . DISCONTD: cyanocobalamin ((VITAMIN B-12)) injection 1,000 mcg  1,000 mcg Intramuscular Q30 days Caleen Essex III, MD   1,000 mcg at 08/31/11 1050  . DISCONTD: FLUoxetine (PROZAC) capsule 10 mg  10 mg Oral Daily Caleen Essex III, MD   10 mg at 09/04/11 1105  . DISCONTD: folic acid (FOLVITE) tablet 1 mg  1 mg Oral Daily Caleen Essex III, MD   1 mg at 09/04/11 1104  . DISCONTD: gabapentin (NEURONTIN) capsule 300 mg  300 mg Oral TID Caleen Essex III, MD   300 mg at 09/04/11 1720  . DISCONTD: hydrochlorothiazide (HYDRODIURIL) tablet 25 mg  25 mg Oral Daily Caleen Essex III, MD   25 mg at 09/04/11 1103  . DISCONTD: HYDROcodone-acetaminophen (NORCO) 5-325 MG per tablet 1 tablet  1 tablet Oral Q6H PRN Kandis Cocking, MD   1 tablet at 09/03/11 0424  . DISCONTD: morphine 4 MG/ML injection 4 mg  4 mg Intravenous Q1H PRN Caleen Essex III, MD   1 mg at 08/31/11 1623  . DISCONTD: ondansetron (ZOFRAN) injection 4 mg  4 mg Intravenous Q6H PRN Caleen Essex III, MD      . DISCONTD: ondansetron [facility]) tablet 4 mg  4 mg Oral Q6H PRN Caleen Essex III, MD      . DISCONTD: predniSONE (DELTASONE) tablet 10 mg  10 mg Oral Q breakfast Caleen Essex III, MD   10 mg at 09/04/11 1211  . DISCONTD: thiamine (VITAMIN B-1) tablet 100 mg  100 mg Oral BID Caleen Essex III, MD   100 mg at 09/04/11 1101     Allergies: Allergies  Allergen Reactions  . Amitriptyline Hcl Swelling    In the face.  . Doxycycline Hyclate Itching    Feels  like something crawling under her skin     Past Medical History: Past Medical History  Diagnosis Date  . Anemia, B12 deficiency   . History of acute pancreatitis   . Right knee pain     No recent imaging on chart  . 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 Dr. Su Hilt, [facility]  . Hypertriglyceridemia   . GERD (gastroesophageal reflux disease)   . Vitamin d deficiency   . Subdural hematoma 02/2008    Likely 2/2 trauma from seizure from EtOH withdrawal, chronic in nature, sees Dr. Robyne Askew. Most recent CT head 10/2009 showing stable but persistent hematoma without mass effect.  . History of seizure disorder     Likely 2/2 alcohol abuse  . Hypocalcemia   . Hypomagnesemia   . Failure to thrive     Unclear etiology  . HTN (hypertension)   . Thrombocytopenia   . Anemia, macrocytic   . Hepatomegaly     On exam  . Alcohol abuse   . Joint pain   . Alcohol abuse   . Arthritis   . Vitamin d deficiency   . Menopause   . Pancreatitis   . Insomnia   . Hyperlipidemia   . Sinusitis   . Pernicious anemia   . Subdural hematoma   . Macrocytic anemia   . Hepatomegaly   . 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 Seizures     1.5 YRS  LAST ONE  . Depression   . Hepatitis     HEPATOMEGALY     Past Surgical History: Past Surgical History  Procedure Date  . Cesarean section 1983  . Esophagogastroduodenoscopy 07/11/2011    Procedure: ESOPHAGOGASTRODUODENOSCOPY (EGD);  Surgeon: Theda Belfast, MD;  Location: [facility];  Service: Endoscopy;  Laterality: N/A;  . Colonoscopy 07/11/2011    Procedure: COLONOSCOPY;  Surgeon: Theda Belfast, MD;  Location: [facility];  Service: Endoscopy;  Laterality: N/A;  . Eye surgery     LEFT EYE YRS AGO   . Rt colectomy 08/28/2011    Family  History: Family History  Problem Relation Age of Onset  . Cancer Mother     Died from stomach cancer and "flesh eating rash  . Heart failure Father     Died in 75s from an MI  . Alcohol abuse Sister     Twin sister drinks a lot, as did both her parents and brothers  . Stroke Brother     Has 7 brothers, 1 with CVA    Social History: History   Social History  . Marital Status: Divorced    Spouse Name: N/A    Number of Children: N/A  . Years of Education: N/A   Occupational History  . Not on file.   Social History Main Topics  . Smoking status: Former Smoker    Types: Cigarettes    Quit date: 09/20/2010  . Smokeless tobacco: Never Used  . Alcohol Use: Yes     drinks a 5th per day  . Drug Use: Yes    Special: Marijuana, Cocaine     HX  USE   . Sexually Active: Not Currently   Other Topics Concern  . Not on file   Social History Narrative   Lives with her significant other and 2 grandchildren.Has 7 brothers and 4 sisters, 1 twin sister.Unemployed, worked in [employer]. Abuses alcoholNo drug use.    Review of  Systems: Constitutional:  Denies fever, chills, diaphoresis, Reports appetite change and fatigue since before colectomy.  HEENT: Reports visual disturbance of seeing "everything upside down" as per HPI, denies eye pain, redness, hearing loss, ear pain, congestion, sore throat, rhinorrhea, sneezing, mouth sores, trouble swallowing, neck pain, neck stiffness and tinnitus.  Respiratory: Denies SOB, DOE, cough, chest tightness, and wheezing.  Cardiovascular: Denies chest pain, palpitations and leg swelling.  Gastrointestinal: Denies nausea, vomiting, abdominal pain, diarrhea, constipation, blood in stool and abdominal distention.  Genitourinary: Denies dysuria, urgency, frequency, hematuria, flank pain and difficulty urinating.  Musculoskeletal: Denies myalgias, back pain, joint swelling, arthralgias and gait problem.   Skin: Denies pallor, rash and wound.   Neurological: Reports frontal headache since seizure today, Denies dizziness, seizures, syncope, weakness, light-headedness, numbness and headaches.   Hematological: Denies adenopathy. Easy bruising, personal or family bleeding history.  Psychiatric/ Behavioral: Question of visual hallucination ie "everything upside down", Denies dysphoric mood changes, confusion, nervousness, sleep disturbance and agitation.    Vital Signs: T: 98 P: 118 BP: 139/98 RR: 17 O2 sat: 100% room air   Physical Exam: General: Cachetic African American female, in no acute distress  Head: Normocephalic, atraumatic, thin hair with vitiligo patches on scalp, temporal wasting  Eyes: PERRLA, EOMI, murky sclera bilateral  Nose: Nares clear without discharge or hypertrophy of turbinates  Throat: Moist mucus membranes, Oropharynx nonerythematous, no exudate appreciated.   Neck: No deformities, masses, or tenderness noted.Supple, No carotid Bruits, no JVD appreciated  Lungs:  Normal respiratory effort. Clear to auscultation BL without crackles or wheezes from apices to bases anteriorly.  Heart: Mildly tachycardic, regular rhythm, S1 and S2 normal without gallop, murmur, or rubs appreciated  Abdomen:  BS normoactive. Soft, Nondistended, mildly tender peri-incisional well healed areas in left and right lower quadrants with staples intact, clean, dry, no erythema  Extremities: No pretibial edema, distal pulses intact bilaterally  Neurologic: A&O X3, CN II - XII are grossly intact. Motor strength is 5/5 in all extremities, Sensations intact to light touch, normal finger-to-nose, normal heel shin to tibia maneuver, Achilles, patellar and radial  Reflexes 1+  Psych: Blunt affect, alert, appropriate and cooperative throughout examination  Skin: No visible rashes or erythema   Lab results: Basic Metabolic Panel: Recent Labs  [facility] 09/05/11 1438   NA 137   K 4.5   CL 102   CO2 18*   GLUCOSE 128*   BUN 15   CREATININE  0.98   CALCIUM 6.6*   MG --   PHOS --   Liver Function Tests: Recent Labs  [facility] 09/05/11 1615   AST 22   ALT 8   ALKPHOS 116   BILITOT 0.2*   PROT 8.5*   ALBUMIN 2.4*   No results found for this basename: LIPASE:2,AMYLASE:2 in the last 72 hours CBC: Recent Labs  [facility] 09/05/11 1445   WBC 9.3   NEUTROABS 6.5   HGB 8.6*   HCT 25.8*   MCV 100.4*   PLT 376    Anemia Panel: No results found for this basename: VITAMINB12,FOLATE,FERRITIN,TIBC,IRON,RETICCTPCT in the last 72 hours  Urine Drug Screen: pending  Urinalysis: pending   Imaging results:  Ct Head Wo Contrast  09/05/2011  *RADIOLOGY REPORT*  Clinical Data: Seizures  CT HEAD WITHOUT CONTRAST  Technique:  Contiguous axial images were obtained from the base of the skull through the vertex without contrast.  Comparison: 10/24/2009  Findings: No evidence of parenchymal hemorrhage or extra-axial fluid collection.  Stable extra-axial density along the  right aspect of the anterior falx (series 2/image 13), likely related to prior subdural hemorrhage.  No mass lesion, mass effect, or midline shift.  No CT evidence of acute infarction.  Subcortical white matter and periventricular small vessel ischemic changes. Intracranial atherosclerosis.  Global cortical atrophy.  The visualized paranasal sinuses are essentially clear.  Leftward nasal septal deviation (series 3/image 1).  The mastoid air cells are unopacified.  No evidence of calvarial fracture.  IMPRESSION: No evidence of acute intracranial abnormality.  Atrophy with small vessel ischemic changes and intracranial atherosclerosis.  Original Report Authenticated By: Charline Bills, M.D.     Other results: EKG: 119 bpm, sinus tachycardia, largely unchanged from prior    Assessment & Plan: 56 year old woman with history of long standing alcohol abuse, cocaine abuse, prior electrolyte disturbances and seizure disorder since 2009 w/o anti-seizure medications admitted for  tonic clonic seizures x3.  #1 Seizure disorder: no acute findings on CT of head, most likely due to etoh withdrawal or benzodiazepine withdrawal, pt reports not having seizure for over 2 years, denies ever being on anti-meds, other differential includes h/o chronic subdural hematoma which progressed to hygroma, electrolyte abnormalities or other organic brain dysfunction given degree of cerebral atrophy and ischemic changes PLAN -admit to telemetry -consult Neuro -obtain MRI of brain -replete electrolytes as needed -continue home regimen of vitamin supplement  #2 Hypocalcemia: corrected calcium 7.9, no paresthesia, muscle spasms or hyporeflexia on exam, EKG without acute changes PLAN -continue supplemental calcium and magnesium -will check ionized calcium -check magnesium level  #3 s/p colectomy: stable PLAN  #4 alcohol abuse: reports sobriety for ~1 week, h/o DT's previously requiring intubation PLAN -CIWA protocol with IV ativan prn -continue thiamine and folate supplements  #5 severe malnutrition: albumin 2.4, likely secondary to chronic alcoholism in addition to chronic pancreatitis, BMI 17.9 PLAN -Ensure supplements tid -nutrition consult for further recs  #6 hypertension: stable on outpt regimen PLAN -continue amlodipine 10 mg qd   #7 Depression: stable on Prozac and Bupropion although there is concern of Bupropion lowering seizure threshold PLAN -continue Prozac -discontinue Bupropion -continue to monitor  #8 Vitamin B12 deficiency anemia: on B12 injections, Hgb stable at 8.6 PLAN -continue to monitor   Lab 09/05/11 1445 09/02/11 0630 08/30/11 0620  HGB 8.6* 8.2* 8.3*  HCT 25.8* 25.7* 25.5*  WBC 9.3 4.9 12.5*  PLT 376 265 177   #9 h/o chronic pancreatitis: stable without complaints of abdominal pain, likely contributing to malabsorption PLAN -continue pancreatic digestive enzymes for malabsorption  #10 anion gap: elevated at 17, likely secondary to tissue  hypoxia due to seizure activity, differential also includes etoh ketoacidosis, starvation ketoacidosis, and nonhypoxic lactic acidosis although less likely given above lab and exam findings PLAN -monitor bmet  DVT PPX - SCDs

## 2011-09-05 NOTE — Progress Notes (Signed)
Pt went down for MRI but came back due to staples from previous surgery last week along her right abdomen.  Will notify the MD of t his and see about placing order for MRI when staples are removed.

## 2011-09-05 NOTE — Progress Notes (Addendum)
CRITICAL VALUE ALERT  Critical value received: Magnesium 0.8  Date of notification:  09/05/2011  Time of notification:  2333  Critical value read back:yes  Nurse who received alert:  Wayne Both, RN BSN  MD notified (1st page):  Teaching Service  Time of first page:  2334  MD notified (2nd page):  Time of second page:  Responding MD:  Dr. Candy Sledge  Time MD responded:  2337

## 2011-09-05 NOTE — ED Notes (Signed)
Admitting MD at bedside.

## 2011-09-05 NOTE — Consult Note (Signed)
Neurology Consult Note  Referring Physician: Dr. Denton Meek Primary Care Physician: Deatra Robinson, MD, MD  Chief Complaint: Seizures  HPI: [name] [name] is a 56 y.o.  female with a history of a SDH and alcohol withdrawal seizures who presents today due to seizures.  The patient was just in the hospital from 3/25 and discharged yesterday evening at 5 pm after having a colectomy for a cecal mass.  During the hospital stay she became quite confused and agitated again due to alcohol withdrawal and therefore was placed on CIWA protocol.  She mostly slept for the end of her admission.  She received several doses of ativan with the last one on 3/29.  She went home last night and was fine.  This morning she had an event of generalized shaking around 10 this morning.  She then took a nap and afterwards had another seizure.  Her family called EMS and she again had another seizure wile in the ER.  The patient states she did not have anything to drink last night or this morning.  Her last drink was on Monday or Tuesday of last week.  She normally drinks 2 pints of liquor or wine a day.    Current facility-administered medications:0.9 %  sodium chloride infusion, , Intravenous, Once, Toy Baker, MD;  sodium chloride 0.9 % bolus 1,000 mL, 1,000 mL, Intravenous, Once, Otilio Miu, PA, 1,000 mL at 09/05/11 1719 Facility-Administered Medications Ordered in Other Encounters: DISCONTD: acetaminophen (TYLENOL) suppository 650 mg, 650 mg, Rectal, Q6H PRN, Atilano Ina, MD,FACS, 650 mg at 08/28/11 2311;  DISCONTD: amLODipine (NORVASC) tablet 10 mg, 10 mg, Oral, Daily, Caleen Essex III, MD, 10 mg at 09/04/11 1106;  DISCONTD: buPROPion [facility]) tablet 100 mg, 100 mg, Oral, TID, Caleen Essex III, MD, 100 mg at 09/04/11 1610 DISCONTD: calcium gluconate tablet 500 mg, 500 mg, Oral, Daily, Caleen Essex III, MD, 500 mg at 09/04/11 1105;  DISCONTD: cyanocobalamin ((VITAMIN B-12)) injection 1,000 mcg, 1,000  mcg, Intramuscular, Q30 days, Caleen Essex III, MD, 1,000 mcg at 08/31/11 1050;  DISCONTD: FLUoxetine (PROZAC) capsule 10 mg, 10 mg, Oral, Daily, Caleen Essex III, MD, 10 mg at 09/04/11 1105 DISCONTD: folic acid (FOLVITE) tablet 1 mg, 1 mg, Oral, Daily, Caleen Essex III, MD, 1 mg at 09/04/11 1104;  DISCONTD: gabapentin (NEURONTIN) capsule 300 mg, 300 mg, Oral, TID, Caleen Essex III, MD, 300 mg at 09/04/11 1720;  DISCONTD: hydrochlorothiazide (HYDRODIURIL) tablet 25 mg, 25 mg, Oral, Daily, Caleen Essex III, MD, 25 mg at 09/04/11 1103 DISCONTD: HYDROcodone-acetaminophen (NORCO) 5-325 MG per tablet 1 tablet, 1 tablet, Oral, Q6H PRN, Kandis Cocking, MD, 1 tablet at 09/03/11 0424;  DISCONTD: morphine 4 MG/ML injection 4 mg, 4 mg, Intravenous, Q1H PRN, Caleen Essex III, MD, 1 mg at 08/31/11 1623;  DISCONTD: ondansetron (ZOFRAN) injection 4 mg, 4 mg, Intravenous, Q6H PRN, Caleen Essex III, MD;  DISCONTD: ondansetron (ZOFRAN) tablet 4 mg, 4 mg, Oral, Q6H PRN, Caleen Essex III, MD DISCONTD: predniSONE (DELTASONE) tablet 10 mg, 10 mg, Oral, Q breakfast, Caleen Essex III, MD, 10 mg at 09/04/11 1211;  DISCONTD: thiamine (VITAMIN B-1) tablet 100 mg, 100 mg, Oral, BID, Caleen Essex III, MD, 100 mg at 09/04/11 1101  Past Medical History  Diagnosis Date  . Anemia, B12 deficiency   . History of acute pancreatitis   . Right knee pain     No recent imaging on chart  .  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 Dr. Su Hilt, [facility]  . Hypertriglyceridemia   . GERD (gastroesophageal reflux disease)   . Vitamin d deficiency   . Subdural hematoma 02/2008    Likely 2/2 trauma from seizure from EtOH withdrawal, chronic in nature, sees Dr. Robyne Askew. Most recent CT head 10/2009 showing stable but persistent hematoma without mass effect.  . History of seizure disorder     Likely 2/2 alcohol abuse  . Hypocalcemia   . Hypomagnesemia   . Failure to thrive     Unclear  etiology  . HTN (hypertension)   . Thrombocytopenia   . Anemia, macrocytic   . Hepatomegaly     On exam  . Alcohol abuse   . Joint pain   . Alcohol abuse   . Arthritis   . Vitamin d deficiency   . Menopause   . Pancreatitis   . Insomnia   . Hyperlipidemia   . Sinusitis   . Pernicious anemia   . Subdural hematoma   . Macrocytic anemia   . Hepatomegaly   . 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 Seizures     1.5 YRS  LAST ONE  . Depression   . Hepatitis     HEPATOMEGALY      Past Surgical History  Procedure Date  . Cesarean section 1983  . Esophagogastroduodenoscopy 07/11/2011    Procedure: ESOPHAGOGASTRODUODENOSCOPY (EGD);  Surgeon: Theda Belfast, MD;  Location: [facility];  Service: Endoscopy;  Laterality: N/A;  . Colonoscopy 07/11/2011    Procedure: COLONOSCOPY;  Surgeon: Theda Belfast, MD;  Location: [facility];  Service: Endoscopy;  Laterality: N/A;  . Eye surgery     LEFT EYE YRS AGO   . Rt colectomy 08/28/2011    Allergies  Allergen Reactions  . Amitriptyline Hcl Swelling    In the face.  . Doxycycline Hyclate Itching    Feels like something crawling under her skin    Family History  Problem Relation Age of Onset  . Cancer Mother     Died from stomach cancer and "flesh eating rash  . Heart failure Father     Died in 72s from an MI  . Alcohol abuse Sister     Twin sister drinks a lot, as did both her parents and brothers  . Stroke Brother     Has 7 brothers, 1 with CVA    History  Substance Use Topics  . Smoking status: Former Smoker    Types: Cigarettes    Quit date: 09/20/2010  . Smokeless tobacco: Never Used  . Alcohol Use: Yes     drinks a 5th per day      Review of Systems A complete review of systems was performed and was negative.  Physical Exam: BP 127/84  Pulse 114  Temp(Src) 99.3 F (37.4 C) (Oral)  Resp 18  Ht 5\' 1"  (1.549 m)  Wt 39.2 kg (86 lb 6.7 oz)  BMI 16.33 kg/m2  SpO2 100%  GENERAL:     Well nourished, well  hydrated, no acute distress.   CARDIOVASCULAR:   - Regular rate and rhythm, no thrills or palpable murmurs, S1, S2, no murmur, no rubs or gallops.  - Carotid arteries: No carotid bruits.   MENTAL STATUS EXAM:    - Orientation: Alert and oriented to person, place and time.  - Memory: Cooperative, follows commands well. Recent and remote memory normal.  -  Attention, concentration: Attention span and concentration are normal.  - Language: Speech is clear and language is normal.  - Fund of knowledge: Aware of current events, vocabulary appropriate for patient age.   CRANIAL NERVES:    - CN 2 (Optic): Visual fields intact to confrontation, funduscopic examination without optic disk pallor or edema, retinal vessels are normal.  - CN 3,4,6 (EOM): Pupils equal and reactive to light and near full eye movement without nystagmus.  - CN 5 (Trigeminal): Facial sensation is normal, no weakness of masticatory muscles.  - CN 7 (Facial): No facial weakness or asymmetry.  - CN 8 (Auditory): Auditory acuity grossly normal.  - CN 9,10 (Glossophar): The uvula is midline, the palate elevates symmetrically.  - CN 11 (spinal access): Normal sternocleidomastoid and trapezius strength.  -CN 12 (Hypoglossal): The tongue is midline. No atrophy or fasciculations.   MOTOR:  - Deltoids:                             (R): 5   (L): 5  - Biceps:                               (R): 5   (L): 5  - Triceps:                              (R): 5    (L): 5  - Wrist Extensors:                (R): 5    (L): 5  - Wrist Flexors:                    (R): 5    (L): 5  - Grip:         (R): 5    (L): 5  - Hip Flexors:                       (R): 5    (L): 5  - Quadriceps:                       (R): 5    (L): 5  - Hamstrings:                       (R): 5    (L): 5  - Tibialis Anterior:                 (R): 5    (L): 5  - Medial Gastrocnemius:     (R): 5    (L): 5  Muscle Tone: Tone and muscle bulk are normal in the upper and lower  extremities.   REFLEXES:   - Biceps:                 (R): 2+  (L):2+  - Brachioradialis:    (R): 2+  (L): 2+  - Patellar:                (R): 2+   (L):2+  - Achilles:                (R): 2+   (L):2+  - Babinski:   (R): absent  (L): absent  COORDINATION:  finger-to-nose intact, heel-to-shin intact, and rapid alternating movements intact,  no tremor.   SENSATION:   Intact to light touch in all extremities   GAIT: Gait deferred by pt.  Diagnostic Studies: CT Head- no acute intracranial abnormality, Atrophy with small vessel ischemic changes and intracranial atherosclerosis.  Impression: 56 y/o female with a history of prior alcohol withdrawal seizures presenting today with several seizures. She has been off alcohol for one week now so it would be rare for her to have withdrawal seizures at this point, especially given that her mental status is normal, she is not tachycardic, hypertensive, or diaphoretic.  It is possible this is due to withdrawal from the benzos she was given in the hospital, or she has a new cerebral injury resulting in seizures.  Recommendations: 1.  MRI brain with and without contrast 2.  EEG 3.  Ativan 0.5mg  tid orally for now.  Will decide on other AEDs after studies have been performed.   It has been a pleasure to participate in the care of this patient.  Best Regards, Lajuana Carry, MD

## 2011-09-05 NOTE — ED Notes (Signed)
Pt has staples to right side abdomen from right hemicolectomy on 08/28/11. Pt has 3 single staples to 3 sites on left side of abd from port sites. No redness or drainage noted at incision sites.   Pt reports she did not get her seizure medication refilled after d/c from hospital yesterday and missed her evening dose. Family with pt states pt has had 2 seizures prior to her arrival in the ED.

## 2011-09-05 NOTE — ED Notes (Signed)
Seizure like activity witnessed by RN, lasting approx 1 min. Pt placed on 3L O2 via [place]. No incontinence and alert and oriented x4 post activity.

## 2011-09-05 NOTE — ED Provider Notes (Signed)
History     CSN: [id number]  Arrival date & time 09/05/11  1417   First MD Initiated Contact with Patient 09/05/11 1508      Chief Complaint  Patient presents with  . Seizures    (Consider location/radiation/quality/duration/timing/severity/associated sxs/prior treatment) HPI History provided by pt, her family member and prior chart.  Pt's family member reports that patient had two witnessed seizures today.  Both generalized tonic-clonic and lasted for 1-2 minutes.  No post-ictal period, was not incontinent of urine and did not bit her tongue.  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  Seizures today similar to those she has had in the past.  She is non-compliant w/ her gabapentin because she takes so many.  Missed dose yesterday evening but took it this morning.  Pt denies recent head trauma or illness including fever, cough, SOB, abd pain, vomiting and diarrhea.  Per prior chart, pt d/c'd from hospital yesterday after R colectomy.  She has a h/o chronic alcohol abuse as well as DTs.  Reports that she experienced withdrawal sx during this recent hospital stay, including tremors and hallucinations.  Has not experienced any tremors, sweats, vomiting or diarrhea since discharge.   Past Medical History  Diagnosis Date  . Anemia, B12 deficiency   . History of acute pancreatitis   . Right knee pain     No recent imaging on chart  . 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 Dr. Su Hilt, [facility]  . Hypertriglyceridemia   . GERD (gastroesophageal reflux disease)   . Vitamin d deficiency   . Subdural hematoma 02/2008    Likely 2/2 trauma from seizure from EtOH withdrawal, chronic in nature, sees Dr. Robyne Askew. Most recent CT head 10/2009 showing stable but persistent hematoma without mass effect.  . History of seizure disorder     Likely 2/2 alcohol abuse  . Hypocalcemia   . Hypomagnesemia   . Failure  to thrive     Unclear etiology  . HTN (hypertension)   . Thrombocytopenia   . Anemia, macrocytic   . Hepatomegaly     On exam  . Alcohol abuse   . Joint pain   . Alcohol abuse   . Arthritis   . Vitamin d deficiency   . Menopause   . Pancreatitis   . Insomnia   . Hyperlipidemia   . Sinusitis   . Pernicious anemia   . Subdural hematoma   . Macrocytic anemia   . Hepatomegaly   . 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 Seizures     1.5 YRS  LAST ONE  . Depression   . Hepatitis     HEPATOMEGALY     Past Surgical History  Procedure Date  . Cesarean section 1983  . Esophagogastroduodenoscopy 07/11/2011    Procedure: ESOPHAGOGASTRODUODENOSCOPY (EGD);  Surgeon: Theda Belfast, MD;  Location: [facility];  Service: Endoscopy;  Laterality: N/A;  . Colonoscopy 07/11/2011    Procedure: COLONOSCOPY;  Surgeon: Theda Belfast, MD;  Location: [facility];  Service: Endoscopy;  Laterality: N/A;  . Eye surgery     LEFT EYE YRS AGO   . Rt colectomy 08/28/2011    Family History  Problem Relation Age of Onset  . Cancer Mother     Died from stomach cancer and "flesh eating rash  . Heart failure Father     Died in 33s from an MI  .  Alcohol abuse Sister     Twin sister drinks a lot, as did both her parents and brothers  . Stroke Brother     Has 7 brothers, 1 with CVA    History  Substance Use Topics  . Smoking status: Former Smoker    Types: Cigarettes    Quit date: 09/20/2010  . Smokeless tobacco: Never Used  . Alcohol Use: Yes     In treatment at the [facility].(COMPLETE 6 MONTHS)    OB History    Grav Para Term Preterm Abortions TAB SAB Ect Mult Living                  Review of Systems  All other systems reviewed and are negative.    Allergies  Amitriptyline hcl and Doxycycline hyclate  Home Medications   Current Outpatient Rx  Name Route Sig Dispense Refill  . AMLODIPINE BESYLATE 10 MG PO TABS Oral Take 10 mg by mouth daily.    . AMYLASE-LIPASE-PROTEASE  20-4.5-25 MU PO CPEP Oral Take by mouth 3 (three) times daily with meals.     . BUPROPION HCL 100 MG PO TABS Oral Take 100 mg by mouth 3 (three) times daily.     Marland Kitchen CALCIUM GLUCONATE 500 MG PO TABS Oral Take 500 mg by mouth daily.    . CYANOCOBALAMIN 1000 MCG/ML IJ SOLN Intramuscular Inject 1,000 mcg into the muscle every 30 (thirty) days. Last day of month. Last dose last month    . DICLOFENAC SODIUM 1 % TD GEL Topical Apply 1 application topically 4 (four) times daily.    . ERGOCALCIFEROL 50000 UNITS PO CAPS Oral Take 50,000 Units by mouth once a week. thursdays    . FLUOXETINE HCL 10 MG PO CAPS Oral Take 10 mg by mouth daily.     Marland Kitchen GABAPENTIN 300 MG PO CAPS Oral Take 300 mg by mouth 3 (three) times daily.    Marland Kitchen HYDROCHLOROTHIAZIDE 25 MG PO TABS Oral Take 25 mg by mouth daily.    . IBUPROFEN 100 MG PO TABS Oral Take 200 mg by mouth 2 (two) times daily as needed. For pain.    Marland Kitchen MAGNESIUM OXIDE 400 MG PO TABS Oral Take 400 mg by mouth 2 (two) times daily.    Marland Kitchen PREDNISONE 10 MG PO TABS Oral Take 10 mg by mouth daily. Takes 5 tablets the first day, then 4 tabs day 2, then 3 tabs day 3, then 2 tabs day 4, then 1 tab day 5 and continue 1 tab daily thereafter.   Started on 11/4.    Marland Kitchen PROMETHAZINE HCL 12.5 MG PO TABS Oral Take 12.5 mg by mouth 4 (four) times daily as needed. For nausea    . PROMETHAZINE HCL 12.5 MG PO TABS  TAKE 1 TABLET (12.5 MG TOTAL) BY MOUTH EVERY 6 (SIX) HOURS AS NEEDED FOR NAUSEA. 30 tablet 0  . VITAMIN B-1 100 MG PO TABS Oral Take 100 mg by mouth 2 (two) times daily.     . VOLTAREN 1 % TD GEL  APPLY 1 APPLICATION TOPICALLY 4 (FOUR) TIMES DAILY. 100 g 2    BP 139/98  Pulse 118  Temp(Src) 99.8 F (37.7 C) (Oral)  Resp 17  SpO2 100%  Physical Exam  Nursing note and vitals reviewed. Constitutional: She is oriented to person, place, and time. She appears well-developed. No distress.       Cachectic   HENT:  Head: Normocephalic and atraumatic.  Eyes:  Normal appearance    Neck: Normal range of motion.  Cardiovascular: Regular rhythm and intact distal pulses.        tachycardic  Pulmonary/Chest: Effort normal and breath sounds normal.  Abdominal: Soft. Bowel sounds are normal. She exhibits no distension.       Horizontal incision center of right abd w/ staples in place.  Wound appears to be healing well w/out signs of infection.  Mild, diffuse tenderness of abdomen.   Musculoskeletal: Normal range of motion.  Neurological: She is alert and oriented to person, place, and time. She has normal reflexes. No cranial nerve deficit or sensory deficit. Coordination normal.       5/5 and equal upper and lower extremity strength.  No past pointing.     Skin: Skin is warm and dry. No rash noted.  Psychiatric: She has a normal mood and affect. Her behavior is normal.    ED Course  Procedures (including critical care time)  Labs Reviewed  BASIC METABOLIC PANEL - Abnormal; Notable for the following:    CO2 18 (*)    Glucose, Bld 128 (*)    Calcium 6.6 (*)    GFR calc non Af Amer 64 (*)    GFR calc Af Amer 74 (*)    All other components within normal limits  CBC - Abnormal; Notable for the following:    RBC 2.57 (*)    Hemoglobin 8.6 (*)    HCT 25.8 (*)    MCV 100.4 (*)    All other components within normal limits  DIFFERENTIAL  HEPATIC FUNCTION PANEL  AMMONIA  URINALYSIS, ROUTINE W REFLEX MICROSCOPIC   Ct Head Wo Contrast  09/05/2011  *RADIOLOGY REPORT*  Clinical Data: Seizures  CT HEAD WITHOUT CONTRAST  Technique:  Contiguous axial images were obtained from the base of the skull through the vertex without contrast.  Comparison: 10/24/2009  Findings: No evidence of parenchymal hemorrhage or extra-axial fluid collection.  Stable extra-axial density along the right aspect of the anterior falx (series 2/image 13), likely related to prior subdural hemorrhage.  No mass lesion, mass effect, or midline shift.  No CT evidence of acute infarction.  Subcortical white  matter and periventricular small vessel ischemic changes. Intracranial atherosclerosis.  Global cortical atrophy.  The visualized paranasal sinuses are essentially clear.  Leftward nasal septal deviation (series 3/image 1).  The mastoid air cells are unopacified.  No evidence of calvarial fracture.  IMPRESSION: No evidence of acute intracranial abnormality.  Atrophy with small vessel ischemic changes and intracranial atherosclerosis.  Original Report Authenticated By: Charline Bills, M.D.     1. Seizure disorder   2. Hallucinations       MDM  56yo F w/ h/o seizure disorder since 2009 when she had a subdural hematoma, presents w/ 2 witnessed seizures today.  H/o chronic alcoholism and DTs, most recent drink prior to 3/25 when admitted to hospital for R colectomy.  Experienced tremors and hallucinations in hospital but these sx have resolved.  D/c'd yesterday and missed her dose of gabapentin last night. No recent head trauma or illness.  On exam, afebrile, tachycardic, cachectic-appearing (chronic per prior chart), lungs clear, abd benign and surgical wounds healing well w/out signs of infection, no focal neuro deficits.  Pt received one liter bolus en route to hospital and receiving a second now.  Labs unremarkable.  On re-check, pt reports that she is "in the air.  The doors is upside down and the TV is on the floor".  Will  CT patients head, check an ammonia level and urinalysis and then admit for possible DTs.    Dr. Rubin Payor has discussed admission w/ [facility] and they have agreed to see her.  CT head neg for acute pathology, ammonia level w/in nml range and U/A neg for infection.          Otilio Miu, [place] 09/06/11 1155

## 2011-09-05 NOTE — ED Notes (Signed)
Pt brought in via EMS from home with c/o seizure activity per family. Family reports pt did not take her seizure medications yesterday due to needing refill. Pt has hx of seizures for approx 1 year.

## 2011-09-06 ENCOUNTER — Inpatient Hospital Stay (HOSPITAL_COMMUNITY): Payer: PRIVATE HEALTH INSURANCE

## 2011-09-06 ENCOUNTER — Encounter (HOSPITAL_COMMUNITY): Payer: Self-pay

## 2011-09-06 DIAGNOSIS — G40909 Epilepsy, unspecified, not intractable, without status epilepticus: Principal | ICD-10-CM

## 2011-09-06 DIAGNOSIS — R569 Unspecified convulsions: Secondary | ICD-10-CM

## 2011-09-06 LAB — RETICULOCYTES
RBC.: 2.23 MIL/uL — ABNORMAL LOW (ref 3.87–5.11)
Retic Count, Absolute: 58 10*3/uL (ref 19.0–186.0)
Retic Ct Pct: 2.6 % (ref 0.4–3.1)

## 2011-09-06 LAB — MAGNESIUM: Magnesium: 2.2 mg/dL (ref 1.5–2.5)

## 2011-09-06 LAB — FERRITIN: Ferritin: 440 ng/mL — ABNORMAL HIGH (ref 10–291)

## 2011-09-06 LAB — CBC
MCHC: 33.1 g/dL (ref 30.0–36.0)
Platelets: 331 10*3/uL (ref 150–400)
RDW: 13.1 % (ref 11.5–15.5)
WBC: 6.6 10*3/uL (ref 4.0–10.5)

## 2011-09-06 LAB — IRON AND TIBC: TIBC: 189 ug/dL — ABNORMAL LOW (ref 250–470)

## 2011-09-06 LAB — BASIC METABOLIC PANEL
BUN: 13 mg/dL (ref 6–23)
Calcium: 6.8 mg/dL — ABNORMAL LOW (ref 8.4–10.5)
Creatinine, Ser: 1.07 mg/dL (ref 0.50–1.10)
GFR calc Af Amer: 67 mL/min — ABNORMAL LOW (ref >90)

## 2011-09-06 LAB — CALCIUM, IONIZED: Calcium, Ion: 0.92 mmol/L — ABNORMAL LOW (ref 1.12–1.32)

## 2011-09-06 MED ORDER — LORAZEPAM 0.5 MG PO TABS
0.5000 mg | ORAL_TABLET | Freq: Three times a day (TID) | ORAL | Status: DC
  Administered 2011-09-06 – 2011-09-07 (×4): 0.5 mg via ORAL
  Filled 2011-09-06 (×4): qty 1

## 2011-09-06 MED ORDER — GADOBENATE DIMEGLUMINE 529 MG/ML IV SOLN
8.0000 mL | Freq: Once | INTRAVENOUS | Status: AC
  Administered 2011-09-06: 8 mL via INTRAVENOUS

## 2011-09-06 NOTE — Procedures (Signed)
EEG NUMBER:  [id number].  This routine EEG was requested in this 56 year old woman admitted with hallucinations.  She has stopped drinking alcohol recently.  She has a history of alcohol withdrawal seizures.  She is on no anticonvulsant medication other than lorazepam.  The EEG was done with the patient awake and drowsy.  During periods of maximal wakefulness, she had an 8 cycle per second posterior dominant rhythm that attenuated with eye opening was symmetric.  It was moderately regulated and moderately sustained and of low amplitude. Background activities were composed of a mixture of alpha and beta activities and that were symmetric.  Actually, the amplitude of the alpha rhythm listed as low to medium amplitude.  Photic stimulation produced no significant driving response.  Hyperventilation for 3 minutes with good effort did not markedly change the tracing.  The patient did become drowsy as evidenced by attenuation of the alpha rhythm and muscle activity.  No stage 2 sleep was reached.  EKG revealed a normal sinus rhythm.  CLINICAL INTERPRETATION:  This routine EEG done with the patient awake and drowsy is normal.          ______________________________ Denton Meek, MD    [id number] D:  09/06/2011 17:43:19  T:  09/06/2011 18:19:32  Job #:  [id number]

## 2011-09-06 NOTE — Progress Notes (Signed)
Utilization review completed. Angelic Schnelle, RN, BSN. 09/06/11 

## 2011-09-06 NOTE — Progress Notes (Addendum)
Subjective: Reports feeling "much better", no complaints on exam, aware of plan, sister at bedside, denies visual disturbances today  Objective: Vital signs in last 24 hours: Filed Vitals:   09/05/11 2100 09/06/11 0301 09/06/11 0558 09/06/11 0902  BP: 108/73 118/78 128/85 118/76  Pulse: 108 103 113 105  Temp:   98.7 F (37.1 C) 99 F (37.2 C)  TempSrc:   Oral Oral  Resp:   18 20  Height:      Weight:      SpO2:   93% 97%   Weight change:   Intake/Output Summary (Last 24 hours) at 09/06/11 1227 Last data filed at 09/06/11 1100  Gross per 24 hour  Intake   1480 ml  Output   1175 ml  Net    305 ml   Physical Exam: General: Pleasant, Cachetic African American female, in no acute distress, sitting upright in bed Head: Normocephalic, atraumatic, thin hair with vitiligo patches on scalp, temporal wasting  Eyes: PERRLA, EOMI, murky sclera bilateral  Lungs: Normal respiratory effort. Clear to auscultation BL without crackles or wheezes from apices to bases  Heart: Mildly tachycardic, regular rhythm, S1 and S2 normal without gallop, murmur, or rubs appreciated  Abdomen: BS normoactive. Soft, Nondistended, mildly tender peri-incisional well healed, intact, staples removed Neurologic: A&O X3, CN II - XII are grossly intact. Motor strength is 5/5 in all extremities, Sensations intact to light touch, Achilles, patellar and radial Reflexes 1+  Psych: normal affect and mood, alert, appropriate and cooperative throughout examination   Lab Results: Basic Metabolic Panel:  Lab 09/06/11 2130 09/05/11 2231 09/05/11 1438  NA 139 -- 137  K 4.8 -- 4.5  CL 107 -- 102  CO2 20 -- 18*  GLUCOSE 152* -- 128*  BUN 13 -- 15  CREATININE 1.07 -- 0.98  CALCIUM 6.8* -- 6.6*  MG 2.2 0.8* --  PHOS -- -- --   Liver Function Tests:  Lab 09/05/11 1615  AST 22  ALT 8  ALKPHOS 116  BILITOT 0.2*  PROT 8.5*  ALBUMIN 2.4*    Lab 09/05/11 1620  AMMONIA 42   CBC:  Lab 09/06/11 0329 09/05/11 1445  09/02/11 0630  WBC 6.6 9.3 --  NEUTROABS -- 6.5 2.5  HGB 8.0* 8.6* --  HCT 24.2* 25.8* --  MCV 99.6 100.4* --  PLT 331 376 --   CBG:  Lab 08/31/11 0223  GLUCAP 97    Anemia Panel:  Lab 09/06/11 0910  VITAMINB12 --  FOLATE --  FERRITIN --  TIBC --  IRON --  RETICCTPCT 2.6   Urine Drug Screen: Drugs of Abuse     Component Value Date/Time   LABOPIA NONE DETECTED 09/05/2011 1815   LABOPIA NEGATIVE 05/09/2011 1502   COCAINSCRNUR NONE DETECTED 09/05/2011 1815   COCAINSCRNUR POSITIVE* 05/09/2011 1502   LABBENZ NONE DETECTED 09/05/2011 1815   LABBENZ NEGATIVE 05/09/2011 1502   LABBENZ NEG 04/10/2011 1130   AMPHETMU NONE DETECTED 09/05/2011 1815   AMPHETMU NEG 04/10/2011 1130   THCU NONE DETECTED 09/05/2011 1815   LABBARB NONE DETECTED 09/05/2011 1815   LABBARB NEGATIVE 05/09/2011 1502    Alcohol Level: No results found for this basename: ETH:2 in the last 168 hours Urinalysis:  Lab 09/05/11 1815  COLORURINE YELLOW  LABSPEC 1.011  PHURINE 6.5  GLUCOSEU NEGATIVE  HGBUR NEGATIVE  BILIRUBINUR NEGATIVE  KETONESUR NEGATIVE  PROTEINUR NEGATIVE  UROBILINOGEN 0.2  NITRITE NEGATIVE  LEUKOCYTESUR TRACE*    Studies/Results: Ct Head Wo Contrast  09/05/2011  *  RADIOLOGY REPORT*  Clinical Data: Seizures  CT HEAD WITHOUT CONTRAST  Technique:  Contiguous axial images were obtained from the base of the skull through the vertex without contrast.  Comparison: 10/24/2009  Findings: No evidence of parenchymal hemorrhage or extra-axial fluid collection.  Stable extra-axial density along the right aspect of the anterior falx (series 2/image 13), likely related to prior subdural hemorrhage.  No mass lesion, mass effect, or midline shift.  No CT evidence of acute infarction.  Subcortical white matter and periventricular small vessel ischemic changes. Intracranial atherosclerosis.  Global cortical atrophy.  The visualized paranasal sinuses are essentially clear.  Leftward nasal septal deviation (series 3/image  1).  The mastoid air cells are unopacified.  No evidence of calvarial fracture.  IMPRESSION: No evidence of acute intracranial abnormality.  Atrophy with small vessel ischemic changes and intracranial atherosclerosis.  Original Report Authenticated By: Charline Bills, M.D.   Medications: I have reviewed the patient's current medications. Scheduled Meds:   . amLODipine  10 mg Oral Daily  . calcium gluconate  500 mg Oral Daily  . FLUoxetine  10 mg Oral Daily  . folic acid  1 mg Oral Daily  . lipase/protease/amylase  1 capsule Oral TID WC  . LORazepam  0.5 mg Oral TID  . magnesium oxide  400 mg Oral BID  . magnesium sulfate 1 - 4 g bolus IVPB  2 g Intravenous Once  . mulitivitamin with minerals  1 tablet Oral Daily  . sodium chloride  1,000 mL Intravenous Once  . sodium chloride  3 mL Intravenous Q12H  . thiamine  100 mg Oral BID  . DISCONTD: sodium chloride   Intravenous Once  . DISCONTD: amylase-lipase-protease  1 capsule Oral TID WC  . DISCONTD: thiamine  100 mg Intravenous Daily  . DISCONTD: thiamine  100 mg Oral Daily   Continuous Infusions:   . sodium chloride 125 mL/hr at 09/06/11 0700   PRN Meds:.LORazepam, LORazepam Assessment/Plan: 56 year old woman with history of long standing alcohol abuse, cocaine abuse, prior electrolyte disturbances and seizure disorder since 2009 w/o anti-seizure medications admitted for tonic clonic seizures x3.   #1 Seizure disorder: no acute findings on CT of head, most likely due to etoh withdrawal or benzodiazepine withdrawal, MRI no able to be performed yesterday due to abdominal staples, Dr. Carolynne Edouard office contacted and removed staples since patient nearly 10 days s/p surgery PLAN  -appreciated consult Neuro  -obtain MRI of brain today -EEG today -continue home regimen of vitamin supplement   #2 Hypocalcemia and Hypomagnesia: ionized calcium low at 0.92, magnesium supplemented PLAN  -continue supplemental calcium and magnesium  -will  continue to monitor ionized calcium  -follow magnesium level   #3 s/p colectomy: stable, staples removed  PLAN -f/u with Dr. Carolynne Edouard as scheduled  #4 alcohol abuse: no agitation or prn Ativan received overnight PLAN  -cont CIWA protocol with IV ativan prn  -continue thiamine and folate supplements  -start Ativan 0.5 mg tid per Neuro recs with plans to taper before discharge  #5 severe malnutrition: albumin 2.4, likely secondary to chronic alcoholism in addition to chronic pancreatitis, BMI 16 PLAN  -Ensure supplements tid  -nutrition consult for further recs   #6 hypertension: stable on outpt regimen  PLAN  -continue amlodipine 10 mg qd   #7 Depression: stable on Prozac will continue to defer Bupropion at this point given lowering of seizure threshold PLAN  -continue Prozac  -continue to monitor   #8 Vitamin B12 deficiency anemia: last B12  injections 2012, Hgb stable at ~8  PLAN  -continue to monitor  -check anemia panel  Lab 09/06/11 0329 09/05/11 1445 09/02/11 0630  HGB 8.0* 8.6* 8.2*  HCT 24.2* 25.8* 25.7*  WBC 6.6 9.3 4.9  PLT 331 376 265    #9 h/o chronic pancreatitis: stable without complaints of abdominal pain, likely contributing to malabsorption  PLAN  -continue pancreatic digestive enzymes for malabsorption   #10 anion gap: resolved, likely secondary to seizure activity PLAN  -monitor bmet   DVT PPX - SCDs   LOS: 1 day   Skip Litke 09/06/2011, 12:27 PM

## 2011-09-06 NOTE — ED Provider Notes (Signed)
Medical screening examination/treatment/procedure(s) were performed by non-physician practitioner and as supervising physician I was immediately available for consultation/collaboration.  Juliet Rude. Rubin Payor, MD 09/06/11 2350

## 2011-09-06 NOTE — H&P (Signed)
Internal Medicine Attending Admission Note Date: 09/06/2011  Patient name: [name] [name] Medical record number: [id number] Date of birth: 26-Nov-1955 Age: 56 y.o. Gender: female  I saw and evaluated the patient. I reviewed the resident's note and I agree with the resident's findings and plan as documented in the resident's note, with additional comments as noted below.  Chief Complaint(s): Seizure.  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  Patient's sister reports the patient was lying down when the seizure occurred, and during the event she was "shaking all over".     Physical Exam - key components related to admission:  Filed Vitals:   09/05/11 2100 09/06/11 0301 09/06/11 0558 09/06/11 0902  BP: 108/73 118/78 128/85 118/76  Pulse: 108 103 113 105  Temp:   98.7 F (37.1 C) 99 F (37.2 C)  TempSrc:   Oral Oral  Resp:   18 20  Height:      Weight:      SpO2:   93% 97%    General: Alert, no distress Lungs: Clear Heart: Regular; no extra sounds or murmurs Abdomen: Bowel sounds present, soft, nontender; right abdominal surgical incision present with Steri-Strips in place. Extremities: No edema Neurological: Alert and oriented; cranial nerves II through XII intact; motor 5 over 5; cerebellar intact by finger to nose.  Lab results:   Basic Metabolic Panel:  Basename 09/06/11 0329 09/05/11 2231 09/05/11 1438  NA 139 -- 137  K 4.8 -- 4.5  CL 107 -- 102  CO2 20 -- 18*  GLUCOSE 152* -- 128*  BUN 13 -- 15  CREATININE 1.07 -- 0.98  CALCIUM 6.8* -- 6.6*  MG 2.2 0.8* --  PHOS --  -- --   Liver Function Tests:  [facility] 09/05/11 1615  AST 22  ALT 8  ALKPHOS 116  BILITOT 0.2*  PROT 8.5*  ALBUMIN 2.4*     Basename 09/05/11 1620  AMMONIA 42   CBC:  Basename 09/06/11 0329 09/05/11 1445  WBC 6.6 9.3  NEUTROABS -- 6.5  HGB 8.0* 8.6*  HCT 24.2* 25.8*  MCV 99.6 100.4*  PLT 331 376    Anemia Panel:  Basename 09/06/11 0910  VITAMINB12 --  FOLATE --  FERRITIN --  TIBC --  IRON --  RETICCTPCT 2.6    Urine Drug Screen: Negative  Urinalysis: Yellow, clear, specific gravity 1.011, pH 6.5, trace leukocytes, 3-6 WBCs, 0-2 RBCs, rare bacteria.   Imaging results:  Ct Head Wo Contrast  09/05/2011  *RADIOLOGY REPORT*  Clinical Data: Seizures  CT HEAD WITHOUT CONTRAST  Technique:  Contiguous axial images were obtained from the base of the skull through the vertex without contrast.  Comparison: 10/24/2009  Findings: No evidence of parenchymal hemorrhage or extra-axial fluid collection.  Stable extra-axial density along the right aspect of the anterior falx (series 2/image 13), likely related to prior subdural hemorrhage.  No mass lesion, mass effect, or midline shift.  No CT evidence of acute infarction.  Subcortical white matter and periventricular small vessel ischemic changes. Intracranial atherosclerosis.  Global cortical atrophy.  The visualized paranasal sinuses are essentially clear.  Leftward nasal septal deviation (series 3/image 1).  The mastoid air cells are unopacified.  No evidence of calvarial fracture.  IMPRESSION: No evidence of acute intracranial abnormality.  Atrophy with small vessel ischemic changes and intracranial atherosclerosis.  Original Report Authenticated By: Charline Bills, M.D.    Other results: EKG: Sinus tachycardia, rate 119; Q waves in V1 and V2;  Assessment & Plan by Problem:  1.  Seizures.  The patient has a history of seizures felt to be associated with alcohol; however, she reports that her last alcohol consumption was  prior to her last admission.  She did receive benzodiazepine coverage during her recent hospitalization, and it is possible that her recent seizures are withdrawal seizures.  Neurology consult has seen the patient, and an EEG and MRI of the brain are pending.  Plan is monitor; seizure precautions; follow electrolytes and correct as indicated  2.  Alcohol abuse.  Plan is thiamine and folate supplementation; CIWA protocol.  Social work consult for counseling and treatment options.  3.  Malnutrition.  Plan is nutrition consult; supplement diet.  4.  Depression.  Continue home regimen.  5.  Anemia.  Plan is anemia evaluation with reticulocyte count, iron studies, B12, and folate level; follow hemoglobin; check fecal occult blood.

## 2011-09-06 NOTE — Progress Notes (Signed)
[name], female   DOB: [birth year], 56 y.o.   MRN: [id number]    Subjective: This is a patient of Dr. Billey Chang who had a lap assisted right colectomy 9 days ago.  She was readmitted with seizure disorder.  She requires an MRI of the brain and we have been asked to see the patient to remove her surgical staples so she can proceed with the above listed study.  Patient is doing well.  A little sore, but no other complaints.  Objective: Vital signs in last 24 hours: Temp:  [98.7 F (37.1 C)-99.8 F (37.7 C)] 99 F (37.2 C) (04/03 0902) Pulse Rate:  [103-118] 105  (04/03 0902) Resp:  [14-20] 20  (04/03 0902) BP: (108-139)/(73-98) 118/76 mmHg (04/03 0902) SpO2:  [93 %-100 %] 97 % (04/03 0902) Weight:  [86 lb 6.7 oz (39.2 kg)] 86 lb 6.7 oz (39.2 kg) (04/02 1936) Last BM Date: 09/04/11  Intake/Output from previous day: 04/02 0701 - 04/03 0700 In: 1240 [P.O.:240; I.V.:1000] Out: 450 [Urine:450] Intake/Output this shift: Total I/O In: 240 [P.O.:240] Out: -   PE: Abd: soft, minimally tender, incisions are all c/d/i with staples present.  No evidence of infection.  All staples were removed without difficulty.  Steri-strips were placed with benzoin.  Lab Results:   Basename 09/06/11 0329 09/05/11 1445  WBC 6.6 9.3  HGB 8.0* 8.6*  HCT 24.2* 25.8*  PLT 331 376   BMET  Basename 09/06/11 0329 09/05/11 1438  NA 139 137  K 4.8 4.5  CL 107 102  CO2 20 18*  GLUCOSE 152* 128*  BUN 13 15  CREATININE 1.07 0.98  CALCIUM 6.8* 6.6*   PT/INR No results found for this basename: LABPROT:2,INR:2 in the last 72 hours CMP     Component Value Date/Time   NA 139 09/06/2011 0329   K 4.8 09/06/2011 0329   CL 107 09/06/2011 0329   CO2 20 09/06/2011 0329   GLUCOSE 152* 09/06/2011 0329   BUN 13 09/06/2011 0329   CREATININE 1.07 09/06/2011 0329   CREATININE 0.74 06/28/2011 1024   CALCIUM 6.8* 09/06/2011 0329   CALCIUM 5.6* 12/22/2010 1010   PROT 8.5* 09/05/2011 1615   ALBUMIN 2.4* 09/05/2011 1615     AST 22 09/05/2011 1615   ALT 8 09/05/2011 1615   ALKPHOS 116 09/05/2011 1615   BILITOT 0.2* 09/05/2011 1615   GFRNONAA 58* 09/06/2011 0329   GFRAA 67* 09/06/2011 0329   Lipase     Component Value Date/Time   LIPASE 17 10/24/2009 0026       Studies/Results: Ct Head Wo Contrast  09/05/2011  *RADIOLOGY REPORT*  Clinical Data: Seizures  CT HEAD WITHOUT CONTRAST  Technique:  Contiguous axial images were obtained from the base of the skull through the vertex without contrast.  Comparison: 10/24/2009  Findings: No evidence of parenchymal hemorrhage or extra-axial fluid collection.  Stable extra-axial density along the right aspect of the anterior falx (series 2/image 13), likely related to prior subdural hemorrhage.  No mass lesion, mass effect, or midline shift.  No CT evidence of acute infarction.  Subcortical white matter and periventricular small vessel ischemic changes. Intracranial atherosclerosis.  Global cortical atrophy.  The visualized paranasal sinuses are essentially clear.  Leftward nasal septal deviation (series 3/image 1).  The mastoid air cells are unopacified.  No evidence of calvarial fracture.  IMPRESSION: No evidence of acute intracranial abnormality.  Atrophy with small vessel ischemic changes and intracranial atherosclerosis.  Original Report Authenticated By: Lurlean Horns  Rito Ehrlich, M.D.    Anti-infectives: Anti-infectives    None       Assessment/Plan  1. S/p lap assisted right colectomy 2. Seizure d/o  Plan: 1. Staples were removed.  Ok to proceed with MRI 2. Will check incision tomorrow, otherwise patient stable for discharge whenever ok with primary service.   LOS: 1 day    OSBORNE,KELLY E 09/06/2011  Agree. Ovidio Kin, MD, [facility] Pager: [phone] Office phone:  [phone]

## 2011-09-06 NOTE — Progress Notes (Signed)
[facility] PROGRESS NOTE    SUBJECTIVE   No compliants  OBJECTIVE   Vital signs in last 24 hours: Temp:  [98.7 F (37.1 C)-99.8 F (37.7 C)] 99 F (37.2 C) (04/03 0902) Pulse Rate:  [103-118] 105  (04/03 0902) Resp:  [14-20] 20  (04/03 0902) BP: (108-139)/(73-98) 118/76 mmHg (04/03 0902) SpO2:  [93 %-100 %] 97 % (04/03 0902) Weight:  [39.2 kg (86 lb 6.7 oz)] 39.2 kg (86 lb 6.7 oz) (04/02 1936)  Intake/Output from previous day: 04/02 0701 - 04/03 0700 In: 1240 [P.O.:240; I.V.:1000] Out: 450 [Urine:450] Intake/Output this shift: Total I/O In: 240 [P.O.:240] Out: -  Nutritional status: General  Past Medical History  Diagnosis Date  . Anemia, B12 deficiency   . History of acute pancreatitis   . Right knee pain     No recent imaging on chart  . 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 Dr. Su Hilt, [facility]  . Hypertriglyceridemia   . GERD (gastroesophageal reflux disease)   . Vitamin d deficiency   . Subdural hematoma 02/2008    Likely 2/2 trauma from seizure from EtOH withdrawal, chronic in nature, sees Dr. Robyne Askew. Most recent CT head 10/2009 showing stable but persistent hematoma without mass effect.  . History of seizure disorder     Likely 2/2 alcohol abuse  . Hypocalcemia   . Hypomagnesemia   . Failure to thrive     Unclear etiology  . HTN (hypertension)   . Thrombocytopenia   . Anemia, macrocytic   . Hepatomegaly     On exam  . Alcohol abuse   . Joint pain   . Alcohol abuse   . Arthritis   . Vitamin d deficiency   . Menopause   . Pancreatitis   . Insomnia   . Hyperlipidemia   . Sinusitis   . Pernicious anemia   . Subdural hematoma   . Macrocytic anemia   . Hepatomegaly   . 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 Seizures     1.5 YRS  LAST ONE  . Depression   . Hepatitis     HEPATOMEGALY     Neurologic Exam:  Mental  Status: Alert, oriented, thought content appropriate.  Speech fluent without evidence of aphasia. Able to follow 3 step commands without difficulty. Cranial Nerves: II-Visual fields grossly intact. III/IV/VI-Extraocular movements intact.  Pupils reactive bilaterally. V/VII-Smile symmetric VIII-grossly intact IX/X-normal gag XI-bilateral shoulder shrug XII-midline tongue extension Motor: 5/5 bilaterally with normal tone and bulk Sensory: Pinprick and light touch intact throughout, bilaterally Deep Tendon Reflexes: 2+ and symmetric throughout Plantars: Downgoing bilaterally Cerebellar: Normal finger-to-nose,  heel-to-shin test.     Lab Results: Lab Results  Component Value Date/Time   CHOL 103 12/20/2010  2:43 PM   Lipid Panel No results found for this basename: CHOL,TRIG,HDL,CHOLHDL,VLDL,LDLCALC in the last 72 hours  Studies/Results: Ct Head Wo Contrast  09/05/2011  *RADIOLOGY REPORT*  Clinical Data: Seizures  CT HEAD WITHOUT CONTRAST  Technique:  Contiguous axial images were obtained from the base of the skull through the vertex without contrast.  Comparison: 10/24/2009  Findings: No evidence of parenchymal hemorrhage or extra-axial fluid collection.  Stable extra-axial density along the right aspect  of the anterior falx (series 2/image 13), likely related to prior subdural hemorrhage.  No mass lesion, mass effect, or midline shift.  No CT evidence of acute infarction.  Subcortical white matter and periventricular small vessel ischemic changes. Intracranial atherosclerosis.  Global cortical atrophy.  The visualized paranasal sinuses are essentially clear.  Leftward nasal septal deviation (series 3/image 1).  The mastoid air cells are unopacified.  No evidence of calvarial fracture.  IMPRESSION: No evidence of acute intracranial abnormality.  Atrophy with small vessel ischemic changes and intracranial atherosclerosis.  Original Report Authenticated By: Charline Bills, M.D.    Medications:      Scheduled:   . amLODipine  10 mg Oral Daily  . calcium gluconate  500 mg Oral Daily  . FLUoxetine  10 mg Oral Daily  . folic acid  1 mg Oral Daily  . lipase/protease/amylase  1 capsule Oral TID WC  . LORazepam  0.5 mg Oral TID  . magnesium oxide  400 mg Oral BID  . magnesium sulfate 1 - 4 g bolus IVPB  2 g Intravenous Once  . mulitivitamin with minerals  1 tablet Oral Daily  . sodium chloride  1,000 mL Intravenous Once  . sodium chloride  3 mL Intravenous Q12H  . thiamine  100 mg Oral BID  . DISCONTD: sodium chloride   Intravenous Once  . DISCONTD: amylase-lipase-protease  1 capsule Oral TID WC  . DISCONTD: thiamine  100 mg Intravenous Daily  . DISCONTD: thiamine  100 mg Oral Daily    Assessment/Plan:   56 YO female history of prior alcohol withdrawal seizures now has been off alcohol for one week.  Patient returns to hospital due to seizure activity. Agree with initial note from Dr. Georgiana Shore that it would be rare for her to have withdrawal seizures at this point.    Plan: 1) MRI of brain to look for focal source 2) EEG 3) continue Ativan 0.5 mg tid for now.    Will make further recommendations pending findings.       Felicie Morn PA-C [facility] [phone]  09/06/2011, 11:01 AM

## 2011-09-06 NOTE — Progress Notes (Signed)
Routine EEG w/ video completed. 

## 2011-09-07 ENCOUNTER — Encounter: Payer: Self-pay | Admitting: Neurology

## 2011-09-07 DIAGNOSIS — G40909 Epilepsy, unspecified, not intractable, without status epilepticus: Secondary | ICD-10-CM | POA: Diagnosis present

## 2011-09-07 LAB — CBC
MCV: 101.2 fL — ABNORMAL HIGH (ref 78.0–100.0)
Platelets: 399 10*3/uL (ref 150–400)
RBC: 2.5 MIL/uL — ABNORMAL LOW (ref 3.87–5.11)
RDW: 13.2 % (ref 11.5–15.5)
WBC: 7.3 10*3/uL (ref 4.0–10.5)

## 2011-09-07 LAB — CALCIUM, IONIZED: Calcium, Ion: 1.18 mmol/L (ref 1.12–1.32)

## 2011-09-07 LAB — BASIC METABOLIC PANEL
Chloride: 108 mEq/L (ref 96–112)
Creatinine, Ser: 0.83 mg/dL (ref 0.50–1.10)
GFR calc Af Amer: >90 mL/min (ref >90)
Sodium: 138 mEq/L (ref 135–145)

## 2011-09-07 MED ORDER — ENSURE COMPLETE PO LIQD: 237.0000 mL | Freq: Three times a day (TID) | ORAL | Status: DC

## 2011-09-07 MED ORDER — FOLIC ACID 1 MG PO TABS: 1.0000 mg | ORAL_TABLET | Freq: Every day | ORAL | Status: DC

## 2011-09-07 MED ORDER — LEVETIRACETAM 500 MG PO TABS
500.0000 mg | ORAL_TABLET | Freq: Two times a day (BID) | ORAL | Status: DC
  Administered 2011-09-07: 500 mg via ORAL
  Filled 2011-09-07 (×2): qty 1

## 2011-09-07 MED ORDER — ENSURE COMPLETE PO LIQD
237.0000 mL | Freq: Three times a day (TID) | ORAL | Status: DC
Start: 2011-09-07 — End: 2011-09-07
  Administered 2011-09-07: 237 mL via ORAL

## 2011-09-07 MED ORDER — ADULT MULTIVITAMIN W/MINERALS CH: 1.0000 | ORAL_TABLET | Freq: Every day | ORAL | Status: DC

## 2011-09-07 MED ORDER — LEVETIRACETAM 500 MG PO TABS: 500.0000 mg | ORAL_TABLET | Freq: Two times a day (BID) | ORAL | Status: DC

## 2011-09-07 NOTE — Discharge Summary (Signed)
Internal Medicine Teaching [facility] Discharge Note  Name: [name] [name] MRN: [id number] DOB: 1955-06-30 56 y.o.  Date of Admission: 09/05/2011  2:17 PM Date of Discharge: 09/07/2011 Attending Physician: Margarito Liner, MD  Discharge Diagnosis: Principal Problem:  *Seizure disorder Active Problems:  HYPOMAGNESEMIA  Hypocalcemia  ANEMIA, B12 DEFICIENCY  ALCOHOL ABUSE  DEPRESSION  HYPERTENSION  Failure to thrive  Hepatomegaly  Electrolyte abnormality   Discharge Medications: Medication List  As of 09/07/2011  3:41 PM   STOP taking these medications         buPROPion 100 MG tablet      hydrochlorothiazide 25 MG tablet      predniSONE 10 MG tablet      VOLTAREN 1 % Gel         TAKE these medications         amLODipine 10 MG tablet   Commonly known as: NORVASC   Take 10 mg by mouth daily.      amylase-lipase-protease 20-4.5-25 MU per capsule   Commonly known as: PANGESTYME EC   Take by mouth 3 (three) times daily with meals.      calcium gluconate 500 MG tablet   Take 500 mg by mouth daily.      cyanocobalamin 1000 MCG/ML injection   Commonly known as: (VITAMIN B-12)   Inject 1,000 mcg into the muscle every 30 (thirty) days. Last day of month. Last dose last month      diclofenac sodium 1 % Gel   Commonly known as: VOLTAREN   Apply 1 application topically 4 (four) times daily.      ergocalciferol 50000 UNITS capsule   Commonly known as: VITAMIN D2   Take 50,000 Units by mouth once a week. thursdays      feeding supplement Liqd   Take 237 mLs by mouth 3 (three) times daily between meals.      FLUoxetine 10 MG capsule   Commonly known as: PROZAC   Take 10 mg by mouth daily.      folic acid 1 MG tablet   Commonly known as: FOLVITE   Take 1 tablet (1 mg total) by mouth daily.      gabapentin 300 MG capsule   Commonly known as: NEURONTIN   Take 300 mg by mouth 3 (three) times daily.      ibuprofen 100 MG tablet   Commonly known as: ADVIL,MOTRIN   Take 200 mg by mouth 2 (two) times daily as needed. For pain.      levETIRAcetam 500 MG tablet   Commonly known as: KEPPRA   Take 1 tablet (500 mg total) by mouth 2 (two) times daily.      magnesium oxide 400 MG tablet   Commonly known as: MAG-OX   Take 400 mg by mouth 2 (two) times daily.      mulitivitamin with minerals Tabs   Take 1 tablet by mouth daily.      promethazine 12.5 MG tablet   Commonly known as: PHENERGAN   Take 12.5 mg by mouth 4 (four) times daily as needed. For nausea      thiamine 100 MG tablet   Commonly known as: VITAMIN B-1   Take 100 mg by mouth 2 (two) times daily.            Disposition and follow-up:   Ms.Satina K Domino was discharged from [facility] in Stable condition.    Follow-up Appointments: Follow-up Information    Follow up with TOTH III,PAUL  S, MD. (already scheduled for 2 weeks away)    Contact information:   [facility] [address] [place] [address] [phone]       Follow up with Deatra Robinson, MD on 09/18/2011. (at 9:15am)    Contact information:   [address] [place] [address] [phone]       Follow up with Denton Meek, MD on 11/02/2011. (at 8:30am)    Contact information:   [address] [facility] [place] [address] [phone]         Discharge Orders    Future Appointments: Provider: Department: Dept Phone: Center:   09/18/2011 9:15 AM Denna Haggard, MD [facility] [phone] [facility]   09/28/2011 3:30 PM Robyne Askew, MD [facility] [phone] None   11/02/2011 8:30 AM Milas Gain, MD [facility] [phone] None     Future Orders Please Complete By Expires   Discharge instructions      Comments:   Take medicines as prescribed and keep follow-up appointments.  It is important to not drink alcohol.      Consultations: Neurology Surgery, General    Procedures  Performed:  Ct Head Wo Contrast  09/05/2011  *RADIOLOGY REPORT*  Clinical Data: Seizures  CT HEAD WITHOUT CONTRAST  Technique:  Contiguous axial images were obtained from the base of the skull through the vertex without contrast.  Comparison: 10/24/2009  Findings: No evidence of parenchymal hemorrhage or extra-axial fluid collection.  Stable extra-axial density along the right aspect of the anterior falx (series 2/image 13), likely related to prior subdural hemorrhage.  No mass lesion, mass effect, or midline shift.  No CT evidence of acute infarction.  Subcortical white matter and periventricular small vessel ischemic changes. Intracranial atherosclerosis.  Global cortical atrophy.  The visualized paranasal sinuses are essentially clear.  Leftward nasal septal deviation (series 3/image 1).  The mastoid air cells are unopacified.  No evidence of calvarial fracture.  IMPRESSION: No evidence of acute intracranial abnormality.  Atrophy with small vessel ischemic changes and intracranial atherosclerosis.  Original Report Authenticated By: Charline Bills, M.D.   Mr Laqueta Jean Wo Contrast  09/06/2011  *RADIOLOGY REPORT*  Clinical Data: Seizure disorder.  History of alcoholism and cocaine abuse.  MRI HEAD WITHOUT AND WITH CONTRAST  Technique:  Multiplanar, multiecho pulse sequences of the brain and surrounding structures were obtained according to standard protocol without and with intravenous contrast  Contrast: 8mL MULTIHANCE GADOBENATE DIMEGLUMINE 529 MG/ML IV SOLN  Comparison: CT head 09/05/2011 most recent.  Prior CT scans from 2009 and 2011 are reviewed as well.  Findings: There is no acute stroke, acute hemorrhage, mass lesion, or hydrocephalus.  Premature atrophy is present.  Chronic microvascular ischemic change is noted in the periventricular and subcortical white matter.  A left frontal extra-axial fluid collection is present with  CSF characteristics measuring roughly 4 mm in thickness consistent with a  chronic subdural hygroma.  There is no significant mass effect on the underlying brain.  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  The area on CT of mild focal increased attenuation medially in the right anterior inferior frontal region appears to represent dural thickening.  Mild dural enhancement noted on the left is superficial to the subdural hygroma.  Incidental venous angioma left temporal lobe.  Normal  pituitary and cerebellar tonsils.  Unremarkable upper cervical region.  IMPRESSION: Small 4 mm thick left frontal subdural hygroma.  Atrophy and small vessel disease.  Bilateral dural enhancement may relate to old right greater than left extra-axial subdural collections noted in 2009.  Original Report Authenticated By: Elsie Stain, M.D.     Admission HPI: Patient is a 56 y.o. 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 Pt presents per EMS with daughter and sister providing most of history of present illness. They report that patient had been in her normal state of health since hospital discharge 09/04/11 when about 8 am she fell to floor and "had a seizure". The episode lasted less than 1 minute, EMS was called but pt refused transport to ED. She reportedly returned to her normal baseline until about 2 pm when she suffered a second seizure episode while lying on the couch. Her sister called EMS and the pt was able to be convinced to be transported to the ED.  Pt reports that she  has been without alcohol for over a week and has never had "seizure medicine".   Hospital Course by problem list:  #1 Seizure disorder: The etiology of Ms. Hilgert seizure was unclear given her long standing history of alcohol abuse with recent sobriety of ~1 week; recent discharge from the hospital 1 day prior to this admission after peri-operative alcohol withdrawal managed with Ativan; history of severe electrolyte derangements; and prior seizure history. She was NOT given Dilantin and remained stable without seizures since admission from the Emergency Department. She has had a right frontal subdural hygroma on CT of head in 2009 which demonstrated premature cerebral atrophy and ischemic changes. During this admission initial CT  showed density near location of prior subdural hygroma but no acute findings. Neurology was consulted with recommendations for MRI and EEG to further evaluate for organic brain etiology of her seizures as well as Ativan 0.5 mg tid. MRI was initially delayed due to the presence of staples to her abdominal incisions from recent colectomy.  Surgery was consulted, determined the wound to be well healed  and subsequently removed the staples. MRI was obtained and showed prominent cerebral atrophy and chronic left subdural hygroma as well as evidence of previous right chronic subdural hygroma. She had a normal EEG, neurologic exam remained without focal deficits and visual disturbances of "seeing things upside down" resolved by day of discharge. Ativan was discontinued and she was placed on Keppra 500 mg bid which she is to continue until follow up with Dr. Denton Meek of [facility] in May 2013.  #2 Hypocalcemia and Hypomagnesia: Ms. Franzen had not taken her vitamin and electrolyte supplements after discharge from hospital. Calcium and magnesium was supplemented with magnesium and corrected calcium levels within normal limits(>9) and normal ionized calcium level by day of  discharge.   Lab  09/07/11 0605  09/06/11 0329  09/05/11 2231  09/05/11 1438  09/02/11 0630   CALCIUM  8.1*  6.8*  --  6.6*  7.4*   MG  --  2.2  0.8*  --  --    #3 s/p colectomy: remained stable without complaints. She was evaluated by  General Surgery. Her abdominal staples were removed per General Surgery with recommendations to follow-up with Dr. Carolynne Edouard as previously scheduled in April 2013.  #4 alcohol abuse: She was placed on CIWA protocol without agitation or delirium during this admission. She was managed with Ativan 0.5 mg tid per Neurology recommendations which was discontinued at discharge . She is to continue thiamine and folate supplements as well as abstaining from alcohol use.  #5 severe malnutrition: Her BMI is ~16 with albumin 2.4, likely secondary to chronic alcoholism in addition to chronic pancreatitis. A nutrition consult was obtained with recommendations to continue Ensure Complete supplements tid.  #6 hypertension: remained stable on outpt regimen of amlodipine 10 mg daily.  Hydrochlorathiazide was held on admission and resumption should be re-evaluated at follow up with PCP.  #7 Depression: Bupropion was held given its possible seizure threshold lowering effect. She remained stable and was discharged on her home regimen of Prozac 10 mg daily.  #8 h/o Vitamin B12 deficiency anemia: last B12 injections Feb 2012 per pharmacy records. Her hemoglobin level remained stable at ~8, Vit B12 elevated at 1160, Folate normal of >20 and  elevated Ferritin 440.  Lab  09/07/11 0605  09/06/11 0329  09/05/11 1445   HGB  8.1*  8.0*  8.6*   HCT  25.3*  24.2*  25.8*   WBC  7.3  6.6  9.3   PLT  399  331  376    #9 h/o chronic pancreatitis: remained stable without complaints of abdominal pain. This is likely contributing to malabsorption and her poor nutritional status. She was continued on home regimen of pancreatic digestive enzymes for malabsorption.  #10 anion gap: She initially presented  with an anion gap of 17 which resolved in ~12 hours and is thought to be secondary to her recent seizure activity.   Lab 09/07/11 0605 09/06/11 0329 09/05/11 2231 09/05/11 1438 09/02/11 0630  NA 138 139 -- 137 137  K 4.0 4.8 -- -- --  CL 108 107 -- 102 111  CO2 20 20 -- 18* 17*  GLUCOSE 73 152* -- 128* 78  BUN 11 13 -- 15 3*  CREATININE 0.83 1.07 -- 0.98 0.65  CALCIUM 8.1* 6.8* -- 6.6* 7.4*  MG -- 2.2 0.8* -- --  PHOS -- -- -- -- --     Discharge Vitals:  BP 114/57  Pulse 103  Temp(Src) 98.6 F (37 C) (Oral)  Resp 20  Ht 5\' 1"  (1.549 m)  Wt 83 lb 5.3 oz (37.8 kg)  BMI 15.75 kg/m2  SpO2 99%  Discharge Labs:  Results for orders placed during the hospital encounter of 09/05/11 (from the past 24 hour(s))  CBC     Status: Abnormal   Collection Time   09/07/11  6:05 AM      Component Value Range   WBC 7.3  4.0 - 10.5 (K/uL)   RBC 2.50 (*) 3.87 - 5.11 (MIL/uL)   Hemoglobin 8.1 (*) 12.0 - 15.0 (g/dL)   HCT 16.1 (*) 09.6 - 46.0 (%)   MCV 101.2 (*) 78.0 - 100.0 (fL)   MCH 32.4  26.0 - 34.0 (pg)   MCHC 32.0  30.0 - 36.0 (g/dL)   RDW 04.5  40.9 - 81.1 (%)   Platelets 399  150 - 400 (K/uL)  BASIC METABOLIC PANEL     Status: Abnormal   Collection Time   09/07/11  6:05 AM      Component Value Range   Sodium 138  135 -  145 (mEq/L)   Potassium 4.0  3.5 - 5.1 (mEq/L)   Chloride 108  96 - 112 (mEq/L)   CO2 20  19 - 32 (mEq/L)   Glucose, Bld 73  70 - 99 (mg/dL)   BUN 11  6 - 23 (mg/dL)   Creatinine, Ser 1.61  0.50 - 1.10 (mg/dL)   Calcium 8.1 (*) 8.4 - 10.5 (mg/dL)   GFR calc non Af Amer 79 (*) >90 (mL/min)   GFR calc Af Amer >90  >90 (mL/min)  CALCIUM, IONIZED     Status: Normal   Collection Time   09/07/11  9:17 AM      Component Value Range   Calcium, Ion 1.18  1.12 - 1.32 (mmol/L)    Signed: Kristie Cowman 09/07/2011, 3:41 PM

## 2011-09-07 NOTE — Progress Notes (Signed)
[name], female   DOB: 1956-04-13, 56 y.o.   MRN: [id number]    Subjective: Pt feels well.  Wants to go home.    Objective: Vital signs in last 24 hours: Temp:  [98.4 F (36.9 C)-99 F (37.2 C)] 98.4 F (36.9 C) (04/04 0602) Pulse Rate:  [101-107] 101  (04/04 0602) Resp:  [20] 20  (04/04 0602) BP: (111-127)/(75-88) 123/84 mmHg (04/04 0602) SpO2:  [90 %-100 %] 98 % (04/04 0602) Weight:  [83 lb 5.3 oz (37.8 kg)] 83 lb 5.3 oz (37.8 kg) (04/03 2008) Last BM Date: 09/06/11  Intake/Output from previous day: 04/03 0701 - 04/04 0700 In: 720 [P.O.:720] Out: 2225 [Urine:2225] Intake/Output this shift:    PE: Abd: soft, incisions are c/d/i with steri-strips present.  Lab Results:   Basename 09/07/11 0605 09/06/11 0329  WBC 7.3 6.6  HGB 8.1* 8.0*  HCT 25.3* 24.2*  PLT 399 331   BMET  Basename 09/06/11 0329 09/05/11 1438  NA 139 137  K 4.8 4.5  CL 107 102  CO2 20 18*  GLUCOSE 152* 128*  BUN 13 15  CREATININE 1.07 0.98  CALCIUM 6.8* 6.6*   PT/INR No results found for this basename: LABPROT:2,INR:2 in the last 72 hours CMP     Component Value Date/Time   NA 139 09/06/2011 0329   K 4.8 09/06/2011 0329   CL 107 09/06/2011 0329   CO2 20 09/06/2011 0329   GLUCOSE 152* 09/06/2011 0329   BUN 13 09/06/2011 0329   CREATININE 1.07 09/06/2011 0329   CREATININE 0.74 06/28/2011 1024   CALCIUM 6.8* 09/06/2011 0329   CALCIUM 5.6* 12/22/2010 1010   PROT 8.5* 09/05/2011 1615   ALBUMIN 2.4* 09/05/2011 1615   AST 22 09/05/2011 1615   ALT 8 09/05/2011 1615   ALKPHOS 116 09/05/2011 1615   BILITOT 0.2* 09/05/2011 1615   GFRNONAA 58* 09/06/2011 0329   GFRAA 67* 09/06/2011 0329   Lipase     Component Value Date/Time   LIPASE 17 10/24/2009 0026       Studies/Results: Ct Head [name] Contrast  09/05/2011  *RADIOLOGY REPORT*  Clinical Data: Seizures  CT HEAD WITHOUT CONTRAST  Technique:  Contiguous axial images were obtained from the base of the skull through the vertex without contrast.   Comparison: 10/24/2009  Findings: No evidence of parenchymal hemorrhage or extra-axial fluid collection.  Stable extra-axial density along the right aspect of the anterior falx (series 2/image 13), likely related to prior subdural hemorrhage.  No mass lesion, mass effect, or midline shift.  No CT evidence of acute infarction.  Subcortical white matter and periventricular small vessel ischemic changes. Intracranial atherosclerosis.  Global cortical atrophy.  The visualized paranasal sinuses are essentially clear.  Leftward nasal septal deviation (series 3/image 1).  The mastoid air cells are unopacified.  No evidence of calvarial fracture.  IMPRESSION: No evidence of acute intracranial abnormality.  Atrophy with small vessel ischemic changes and intracranial atherosclerosis.  Original Report Authenticated By: Charline Bills, M.D.   Mr [name] [name] Contrast  09/06/2011  *RADIOLOGY REPORT*  Clinical Data: Seizure disorder.  History of alcoholism and cocaine abuse.  MRI HEAD WITHOUT AND WITH CONTRAST  Technique:  Multiplanar, multiecho pulse sequences of the brain and surrounding structures were obtained according to standard protocol without and with intravenous contrast  Contrast: 8mL MULTIHANCE GADOBENATE DIMEGLUMINE 529 MG/ML IV SOLN  Comparison: CT head 09/05/2011 most recent.  Prior CT scans from 2009 and 2011 are reviewed as well.  Findings: There is no acute stroke, acute hemorrhage, mass lesion, or hydrocephalus.  Premature atrophy is present.  Chronic microvascular ischemic change is noted in the periventricular and subcortical white matter.  A left frontal extra-axial fluid collection is present with  CSF characteristics measuring roughly 4 mm in thickness consistent with a chronic subdural hygroma.  There is no significant mass effect on the underlying brain.  Post infusion, there is abnormal dural enhancement, right greater than left, related to what was previously a  chronic subdural hematoma on the  right documented on CT scans from 2009.  The area on CT of mild focal increased attenuation medially in the right anterior inferior frontal region appears to represent dural thickening.  Mild dural enhancement noted on the left is superficial to the subdural hygroma.  Incidental venous angioma left temporal lobe.  Normal pituitary and cerebellar tonsils.  Unremarkable upper cervical region.  IMPRESSION: Small 4 mm thick left frontal subdural hygroma.  Atrophy and small vessel disease.  Bilateral dural enhancement may relate to old right greater than left extra-axial subdural collections noted in 2009.  Original Report Authenticated By: Elsie Stain, M.D.    Anti-infectives: Anti-infectives    None       Assessment/Plan  1. S/p lap assisted right colectomy.  Plan: 1. Incisions look great.  She may follow up with Dr. Carolynne Edouard at her already scheduled follow up appt.   LOS: 2 days    OSBORNE,KELLY E 09/07/2011  Agree with above.  Ovidio Kin, MD, [facility] Pager: [phone] Office phone:  [phone]

## 2011-09-07 NOTE — Progress Notes (Signed)
Subjective: No reported recurrence of seizure activity. Patient had no complaints.  Objective: Alert and in no acute distress. Patient was well-oriented time as well as place. EEG was normal. MRI showed residual 4 mm subdural hygroma which appeared to be stable compared to previous studies. Bilateral dural thickening was noted from previous intracranial hemorrhages.  Assessment: Probable underlying seizure disorder associated with chronic subdural hygroma as well as history of previous cerebral trauma. Current presentation with recurrent seizures is most likely due to seizure disorder rather than alcohol withdrawal.  Plan: Recommend anticonvulsant treatment with Keppra 500 mg twice a day. Also recommend outpatient neurology followup, following acute hospitalization, with [facility] or with [facility]. No further neurological intervention is indicated at this point, including no further neurodiagnostic studies. Please feel free to request followup if indicated during her hospital stay.

## 2011-09-07 NOTE — Progress Notes (Signed)
INITIAL ADULT NUTRITION ASSESSMENT Date: 09/07/2011   Time: 8:37 AM  Reason for Assessment: MD Consult  ASSESSMENT: Female 56 y.o.  Dx: seizures  Hx:  Past Medical History  Diagnosis Date  . Anemia, B12 deficiency   . History of acute pancreatitis   . Right knee pain     No recent imaging on chart  . 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 Dr. Su Hilt, [facility]  . Hypertriglyceridemia   . GERD (gastroesophageal reflux disease)   . Vitamin d deficiency   . Subdural hematoma 02/2008    Likely 2/2 trauma from seizure from EtOH withdrawal, chronic in nature, sees Dr. Robyne Askew. Most recent CT head 10/2009 showing stable but persistent hematoma without mass effect.  . History of seizure disorder     Likely 2/2 alcohol abuse  . Hypocalcemia   . Hypomagnesemia   . Failure to thrive     Unclear etiology  . HTN (hypertension)   . Thrombocytopenia   . Anemia, macrocytic   . Hepatomegaly     On exam  . Alcohol abuse   . Joint pain   . Alcohol abuse   . Arthritis   . Vitamin d deficiency   . Menopause   . Pancreatitis   . Insomnia   . Hyperlipidemia   . Sinusitis   . Pernicious anemia   . Subdural hematoma   . Macrocytic anemia   . Hepatomegaly   . 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 Seizures     1.5 YRS  LAST ONE  . Depression   . Hepatitis     HEPATOMEGALY    Related Meds:     . amLODipine  10 mg Oral Daily  . calcium gluconate  500 mg Oral Daily  . FLUoxetine  10 mg Oral Daily  . folic acid  1 mg Oral Daily  . gadobenate dimeglumine  8 mL Intravenous Once  . lipase/protease/amylase  1 capsule Oral TID WC  . LORazepam  0.5 mg Oral TID  . magnesium oxide  400 mg Oral BID  . mulitivitamin with minerals  1 tablet Oral Daily  . sodium chloride  3 mL Intravenous Q12H  . thiamine  100 mg Oral BID   Ht: 5\' 1"  (154.9 cm)  Wt: 83 lb 5.3 oz (37.8 kg)  Ideal Wt: 47.7 kg % Ideal Wt: 79%   Wt  Readings from Last 20 Encounters:  09/06/11 83 lb 5.3 oz (37.8 kg)  08/29/11 82 lb (37.195 kg)  08/29/11 82 lb (37.195 kg)  08/21/11 84 lb 4.8 oz (38.238 kg)  08/07/11 82 lb 9.6 oz (37.467 kg)  06/28/11 85 lb 14.4 oz (38.964 kg)  05/09/11 86 lb (39.009 kg)  04/10/11 84 lb 6.4 oz (38.284 kg)  04/06/11 86 lb 7.8 oz (39.23 kg)  04/03/11 83 lb 1.6 oz (37.694 kg)  03/13/11 84 lb 11.2 oz (38.42 kg)  03/09/11 82 lb 11.2 oz (37.512 kg)  01/04/11 84 lb 4.8 oz (38.238 kg)  12/28/10 81 lb (36.741 kg)  12/20/10 81 lb 9.6 oz (37.014 kg)  12/05/10 82 lb 14.4 oz (37.603 kg)  11/21/10 84 lb 14.4 oz (38.51 kg)  11/10/10 86 lb 11.2 oz (39.327 kg)  09/27/10 88 lb 1.6 oz (39.962 kg)  06/28/09 103 lb 1.6 oz (46.766 kg)  Usual Wt: 81 - 86 lb within the past year; 47 kg 2 years ago % Usual Wt: 100%  Body mass index is 15.75 kg/(m^2). Pt is underweight.  Food/Nutrition Related Hx: Ensure TID at home; underweight BMI for 2 years  Labs:  CMP     Component Value Date/Time   NA 138 09/07/2011 0605   K 4.0 09/07/2011 0605   CL 108 09/07/2011 0605   CO2 20 09/07/2011 0605   GLUCOSE 73 09/07/2011 0605   BUN 11 09/07/2011 0605   CREATININE 0.83 09/07/2011 0605   CREATININE 0.74 06/28/2011 1024   CALCIUM 8.1* 09/07/2011 0605   CALCIUM 5.6* 12/22/2010 1010   PROT 8.5* 09/05/2011 1615   ALBUMIN 2.4* 09/05/2011 1615   AST 22 09/05/2011 1615   ALT 8 09/05/2011 1615   ALKPHOS 116 09/05/2011 1615   BILITOT 0.2* 09/05/2011 1615   GFRNONAA 79* 09/07/2011 0605   GFRAA >90 09/07/2011 0605  Magnesium 0.8L  Vitamin B12 1160H  Intake/Output: I/O last 3 completed shifts: In: November 04, 1957 [P.O.:960; I.V.:1000] Out: 2673-11-03 [Urine:2675]     Diet Order: General  Supplements/Tube Feeding: none  IVF:    sodium chloride Last Rate: 125 mL/hr at 09/06/11 0700   Estimated Nutritional Needs:   Kcal: 1350 - 1550 kcal Protein:  50 - 60 grams protein Fluid:  1.2 L/d  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 Hx of alcoholism  and cocaine abuse. Home regimen of Ensure TID, drinking 1 on average daily. Admitted now for seizure.  Pt recently discharged. Noted RD saw pt during previous hospitalization. Pt appears cachectic, clavicle wasting present. Meets criteria for moderate malnutrition of chronic illness due to wasting, and long-standing inability to gain/maintain optimal wt. Pt's BMI is 15.8. Pt not likely meeting >75% of needs with PO intake.   NUTRITION DIAGNOSIS: -Malnutrition (NI-5.2).  Status: Ongoing  RELATED TO: alcohol abuse and drug abuse, chronic conditions  AS EVIDENCE BY: s/p colectomy, underweight BMI, inability to gain/maintain appropriate weight.  MONITORING/EVALUATION(Goals): Goal: Pt to consume >90% of estimated needs with PO intake. Unmet. Monitor: PO intake, weights, labs, I/O's  EDUCATION NEEDS: -No education needs identified at this time  INTERVENTION: 1. Ensure Complete PO TID 2. RD to continue to follow  Dietitian #: [phone] 11/03/2644  DOCUMENTATION CODES Per approved criteria  -Non-severe (moderate) malnutrition in the context of chronic illness -Underweight    Adair Laundry 09/07/2011, 8:37 AM

## 2011-09-07 NOTE — Progress Notes (Signed)
Subjective: No seizures overnight, reports feeling "fine", states ready to go home  Objective: Vital signs in last 24 hours: Filed Vitals:   09/06/11 2008 09/06/11 2100 09/07/11 0519 09/07/11 0602  BP: 111/81 127/83 122/88 123/84  Pulse: 102 103 107 101  Temp: 98.9 F (37.2 C)   98.4 F (36.9 C)  TempSrc: Oral   Oral  Resp: 20   20  Height:      Weight: 83 lb 5.3 oz (37.8 kg)     SpO2: 90%   98%   Weight change: -3 lb 1.4 oz (-1.4 kg)  Intake/Output Summary (Last 24 hours) at 09/07/11 0804 Last data filed at 09/07/11 0547  Gross per 24 hour  Intake    720 ml  Output   2225 ml  Net  -1505 ml   Physical Exam: General: Pleasant, Cachetic African American female, in no acute distress, sitting upright in bed in darkened room Head: Normocephalic, atraumatic, thin hair with vitiligo patches on scalp, temporal wasting  Eyes: PERRLA, EOMI, murky sclera bilateral  Lungs: Normal respiratory effort. Clear to auscultation BL without crackles or wheezes from apices to bases  Heart: Mildly tachycardic, regular rhythm, S1 and S2 normal without gallop, murmur, or rubs appreciated  Abdomen: BS normoactive. Soft, Nondistended, mildly tender peri-incisional well healed Neurologic: nonfocal Psych: normal affect and mood, alert, appropriate and cooperative throughout examination   Lab Results: Basic Metabolic Panel:  Lab 09/07/11 9604 09/06/11 0329 09/05/11 2231  NA 138 139 --  K 4.0 4.8 --  CL 108 107 --  CO2 20 20 --  GLUCOSE 73 152* --  BUN 11 13 --  CREATININE 0.83 1.07 --  CALCIUM 8.1* 6.8* --  MG -- 2.2 0.8*  PHOS -- -- --   Liver Function Tests:  Lab 09/05/11 1615  AST 22  ALT 8  ALKPHOS 116  BILITOT 0.2*  PROT 8.5*  ALBUMIN 2.4*    Lab 09/05/11 1620  AMMONIA 42   CBC:  Lab 09/07/11 0605 09/06/11 0329 09/05/11 1445 09/02/11 0630  WBC 7.3 6.6 -- --  NEUTROABS -- -- 6.5 2.5  HGB 8.1* 8.0* -- --  HCT 25.3* 24.2* -- --  MCV 101.2* 99.6 -- --  PLT 399 331 -- --     Anemia Panel:  Lab 09/06/11 0910  VITAMINB12 1160*  FOLATE >20.0  FERRITIN 440*  TIBC 189*  IRON 23*  RETICCTPCT 2.6   Urine Drug Screen: Drugs of Abuse     Component Value Date/Time   LABOPIA NONE DETECTED 09/05/2011 1815   LABOPIA NEGATIVE 05/09/2011 1502   COCAINSCRNUR NONE DETECTED 09/05/2011 1815   COCAINSCRNUR POSITIVE* 05/09/2011 1502   LABBENZ NONE DETECTED 09/05/2011 1815   LABBENZ NEGATIVE 05/09/2011 1502   LABBENZ NEG 04/10/2011 1130   AMPHETMU NONE DETECTED 09/05/2011 1815   AMPHETMU NEG 04/10/2011 1130   THCU NONE DETECTED 09/05/2011 1815   LABBARB NONE DETECTED 09/05/2011 1815   LABBARB NEGATIVE 05/09/2011 1502    Urinalysis:  Lab 09/05/11 1815  COLORURINE YELLOW  LABSPEC 1.011  PHURINE 6.5  GLUCOSEU NEGATIVE  HGBUR NEGATIVE  BILIRUBINUR NEGATIVE  KETONESUR NEGATIVE  PROTEINUR NEGATIVE  UROBILINOGEN 0.2  NITRITE NEGATIVE  LEUKOCYTESUR TRACE*    Studies/Results: Ct Head Wo Contrast  09/05/2011  *RADIOLOGY REPORT*  Clinical Data: Seizures  CT HEAD WITHOUT CONTRAST  Technique:  Contiguous axial images were obtained from the base of the skull through the vertex without contrast.  Comparison: 10/24/2009  Findings: No evidence of parenchymal hemorrhage or  extra-axial fluid collection.  Stable extra-axial density along the right aspect of the anterior falx (series 2/image 13), likely related to prior subdural hemorrhage.  No mass lesion, mass effect, or midline shift.  No CT evidence of acute infarction.  Subcortical white matter and periventricular small vessel ischemic changes. Intracranial atherosclerosis.  Global cortical atrophy.  The visualized paranasal sinuses are essentially clear.  Leftward nasal septal deviation (series 3/image 1).  The mastoid air cells are unopacified.  No evidence of calvarial fracture.  IMPRESSION: No evidence of acute intracranial abnormality.  Atrophy with small vessel ischemic changes and intracranial atherosclerosis.  Original Report  Authenticated By: Charline Bills, M.D.   Mr Laqueta Jean Wo Contrast  09/06/2011  *RADIOLOGY REPORT*  Clinical Data: Seizure disorder.  History of alcoholism and cocaine abuse.  MRI HEAD WITHOUT AND WITH CONTRAST  Technique:  Multiplanar, multiecho pulse sequences of the brain and surrounding structures were obtained according to standard protocol without and with intravenous contrast  Contrast: 8mL MULTIHANCE GADOBENATE DIMEGLUMINE 529 MG/ML IV SOLN  Comparison: CT head 09/05/2011 most recent.  Prior CT scans from 2009 and 2011 are reviewed as well.  Findings: There is no acute stroke, acute hemorrhage, mass lesion, or hydrocephalus.  Premature atrophy is present.  Chronic microvascular ischemic change is noted in the periventricular and subcortical white matter.  A left frontal extra-axial fluid collection is present with  CSF characteristics measuring roughly 4 mm in thickness consistent with a chronic subdural hygroma.  There is no significant mass effect on the underlying brain.  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  The area on CT of mild focal increased attenuation medially in the right anterior inferior frontal region appears to represent dural thickening.  Mild dural enhancement noted on the left is superficial to the subdural hygroma.  Incidental venous angioma left temporal lobe.  Normal pituitary and cerebellar tonsils.  Unremarkable upper cervical region.  IMPRESSION: Small 4 mm thick left frontal subdural hygroma.  Atrophy and small vessel disease.  Bilateral dural enhancement may relate to old right greater than left extra-axial subdural collections noted in 2009.  Original Report Authenticated By: Elsie Stain, M.D.   Medications: I have reviewed the patient's current medications. Scheduled Meds:    . amLODipine  10 mg Oral Daily  . calcium  gluconate  500 mg Oral Daily  . FLUoxetine  10 mg Oral Daily  . folic acid  1 mg Oral Daily  . gadobenate dimeglumine  8 mL Intravenous Once  . lipase/protease/amylase  1 capsule Oral TID WC  . LORazepam  0.5 mg Oral TID  . magnesium oxide  400 mg Oral BID  . mulitivitamin with minerals  1 tablet Oral Daily  . sodium chloride  3 mL Intravenous Q12H  . thiamine  100 mg Oral BID   Continuous Infusions:    . sodium chloride 125 mL/hr at 09/06/11 0700   PRN Meds:.LORazepam, LORazepam Assessment/Plan: 56 year old woman with history of long standing alcohol abuse, cocaine abuse, prior electrolyte disturbances and seizure disorder since 2009 w/o anti-seizure medications admitted for tonic clonic seizures x3.   #1 Seizure disorder: MRI with left frontal subdural hygroma and right chronic subdural hygroma, EEG normal PLAN  -start keppra 500 mg bid, appreciated Neuro recs -continue home regimen of vitamin supplements  - f/u with Neuro as outpt  #2 Hypocalcemia and  Hypomagnesia: calcium and magnesium supplemented with calcium increased to near normal level of 8.1 and magnesium within normal limits at 2.2 PLAN  -continue supplemental calcium and magnesium  -ionized calcium pending -follow magnesium level   Lab 09/07/11 0605 09/06/11 0329 09/05/11 2231 09/05/11 1438 09/02/11 0630  CALCIUM 8.1* 6.8* -- 6.6* 7.4*  MG -- 2.2 0.8* -- --    #3 s/p colectomy: stable, staples removed, appreciate Surgery following PLAN -f/u with Dr. Carolynne Edouard as scheduled  #4 alcohol abuse: no agitation, managed with Ativan 0.5 mg tid per Neuro recs with plans to taper before discharge PLAN  -cont CIWA protocol with IV ativan prn  -continue thiamine and folate supplements   #5 severe malnutrition: albumin 2.4, likely secondary to chronic alcoholism in addition to chronic pancreatitis, BMI 16 PLAN  -Ensure supplements tid  -awaiting nutrition consult for further recs   #6 hypertension: stable on outpt regimen   PLAN  -continue amlodipine 10 mg qd   #7 Depression: stable on Prozac will continue to defer Bupropion at this point given lowering of seizure threshold PLAN  -continue Prozac  -continue to monitor   #8 h/o Vitamin B12 deficiency anemia: last B12 injections 2012, Hgb stable at ~8, Vit B12 elevated at 1160, Folate normal of >20, elevated Ferritin 440,  PLAN  -continue to monitor   Lab 09/07/11 0605 09/06/11 0329 09/05/11 1445  HGB 8.1* 8.0* 8.6*  HCT 25.3* 24.2* 25.8*  WBC 7.3 6.6 9.3  PLT 399 331 376    #9 h/o chronic pancreatitis: stable without complaints of abdominal pain, likely contributing to malabsorption  PLAN  -continue pancreatic digestive enzymes for malabsorption   #10 anion gap: resolved, likely secondary to seizure activity PLAN  -monitor bmet   #11 Dispo: plan to discharge today with [facility] and Dr. Modesto Charon of [facility]  DVT PPX - SCDs   LOS: 2 days   Sylvester Salonga 09/07/2011, 8:04 AM

## 2011-09-18 ENCOUNTER — Ambulatory Visit (INDEPENDENT_AMBULATORY_CARE_PROVIDER_SITE_OTHER): Payer: PRIVATE HEALTH INSURANCE | Admitting: Internal Medicine

## 2011-09-18 ENCOUNTER — Encounter: Payer: Self-pay | Admitting: Internal Medicine

## 2011-09-18 VITALS — BP 113/79 | HR 116 | Temp 98.1°F | Ht 61.0 in | Wt 78.7 lb

## 2011-09-18 DIAGNOSIS — M545 Low back pain: Secondary | ICD-10-CM

## 2011-09-18 DIAGNOSIS — R569 Unspecified convulsions: Secondary | ICD-10-CM

## 2011-09-18 DIAGNOSIS — G8929 Other chronic pain: Secondary | ICD-10-CM

## 2011-09-18 LAB — COMPLETE METABOLIC PANEL WITH GFR
ALT: 7 U/L (ref 0–35)
AST: 20 U/L (ref 0–37)
Calcium: 8.5 mg/dL (ref 8.4–10.5)
Chloride: 104 mEq/L (ref 96–112)
Creat: 0.89 mg/dL (ref 0.50–1.10)
Sodium: 138 mEq/L (ref 135–145)
Total Bilirubin: 0.3 mg/dL (ref 0.3–1.2)
Total Protein: 8.7 g/dL — ABNORMAL HIGH (ref 6.0–8.3)

## 2011-09-18 MED ORDER — DICLOFENAC SODIUM 75 MG PO TBEC: 75.0000 mg | DELAYED_RELEASE_TABLET | Freq: Two times a day (BID) | ORAL | Status: DC

## 2011-09-18 NOTE — Progress Notes (Signed)
[name], female   DOB: Dec 13, 1955, 55 y.o.   MRN: [id number] HPI:    Hospital follow up for: 1. New seizures. Now on Keppra and Neurontin. No new Seizure activity. 2. Sp cecal granulomatous tumor resection. Reports a continuous severe pain 7-8/10 in intensity at the site of incision. Requests Oxycodone "because Vicodin does not help." However, the patient reports that was not prescribed  "any Vicodin since 2012."  Review of Systems: Negative except per history of present illness  Physical Exam:  Nursing notes and vitals reviewed General:  alert, well-developed, and cooperative to examination.   Lungs:  normal respiratory effort, no accessory muscle use, normal breath sounds, no crackles, and no wheezes. Heart:  normal rate, regular rhythm, no murmurs, no gallop, and no rub.   Abdomen:  soft, non-tender, normal bowel sounds, no distention, no guarding, no rebound tenderness, no hepatomegaly, and no splenomegaly.   Extremities:  No cyanosis, clubbing, edema Neurologic:  alert & oriented X3, nonfocal exam  Meds: Medications Prior to Admission  Medication Sig Dispense Refill  . amLODipine (NORVASC) 10 MG tablet Take 10 mg by mouth daily.      Marland Kitchen amylase-lipase-protease (PANGESTYME EC) 20-4.5-25 MU per capsule Take by mouth 3 (three) times daily with meals.       . calcium gluconate 500 MG tablet Take 500 mg by mouth daily.      . cyanocobalamin (,VITAMIN B-12,) 1000 MCG/ML injection Inject 1,000 mcg into the muscle every 30 (thirty) days. Last day of month. Last dose last month      . diclofenac sodium (VOLTAREN) 1 % GEL Apply 1 application topically 4 (four) times daily.      . ergocalciferol (VITAMIN D2) 50000 UNITS capsule Take 50,000 Units by mouth once a week. thursdays      . feeding supplement (ENSURE COMPLETE) LIQD Take 237 mLs by mouth 3 (three) times daily between meals.  24 Bottle  11  . FLUoxetine (PROZAC) 10 MG capsule Take 10 mg by mouth daily.       . folic  acid (FOLVITE) 1 MG tablet Take 1 tablet (1 mg total) by mouth daily.  30 tablet  11  . gabapentin (NEURONTIN) 300 MG capsule Take 300 mg by mouth 3 (three) times daily.      . hydrochlorothiazide (HYDRODIURIL) 25 MG tablet       . ibuprofen (ADVIL,MOTRIN) 100 MG tablet Take 200 mg by mouth 2 (two) times daily as needed. For pain.      Marland Kitchen levETIRAcetam (KEPPRA) 500 MG tablet Take 1 tablet (500 mg total) by mouth 2 (two) times daily.  60 tablet  11  . magnesium oxide (MAG-OX) 400 MG tablet Take 400 mg by mouth 2 (two) times daily.      . Multiple Vitamin (MULITIVITAMIN WITH MINERALS) TABS Take 1 tablet by mouth daily.  30 tablet  11  . promethazine (PHENERGAN) 12.5 MG tablet Take 12.5 mg by mouth 4 (four) times daily as needed. For nausea      . thiamine (VITAMIN B-1) 100 MG tablet Take 100 mg by mouth 2 (two) times daily.        No current facility-administered medications on file as of 09/18/2011.    Allergies: Amitriptyline hcl and Doxycycline hyclate Past Medical History  Diagnosis Date  . Anemia, B12 deficiency   . History of acute pancreatitis   . Right knee pain     No recent imaging on chart  . Abnormal Pap smear and  cervical HPV (human papillomavirus)     CN1. LGSIL-HPV positive. Dr. Su Hilt, [facility]  . Hypertriglyceridemia   . GERD (gastroesophageal reflux disease)   . Vitamin d deficiency   . Subdural hematoma 02/2008    Likely 2/2 trauma from seizure from EtOH withdrawal, chronic in nature, sees Dr. Robyne Askew. Most recent CT head 10/2009 showing stable but persistent hematoma without mass effect.  . History of seizure disorder     Likely 2/2 alcohol abuse  . Hypocalcemia   . Hypomagnesemia   . Failure to thrive     Unclear etiology  . HTN (hypertension)   . Thrombocytopenia   . Anemia, macrocytic   . Hepatomegaly     On exam  . Alcohol abuse   . Joint pain   . Alcohol abuse   . Arthritis   . Vitamin d deficiency   . Menopause   . Pancreatitis    . Insomnia   . Hyperlipidemia   . Sinusitis   . Pernicious anemia   . Subdural hematoma   . Macrocytic anemia   . Hepatomegaly   . 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 Seizures     1.5 YRS  LAST ONE  . Depression   . Hepatitis     HEPATOMEGALY    Past Surgical History  Procedure Date  . Cesarean section 1983  . Esophagogastroduodenoscopy 07/11/2011    Procedure: ESOPHAGOGASTRODUODENOSCOPY (EGD);  Surgeon: Theda Belfast, MD;  Location: [facility];  Service: Endoscopy;  Laterality: N/A;  . Colonoscopy 07/11/2011    Procedure: COLONOSCOPY;  Surgeon: Theda Belfast, MD;  Location: [facility];  Service: Endoscopy;  Laterality: N/A;  . Eye surgery     LEFT EYE YRS AGO   . Rt colectomy 08/28/2011   Family History  Problem Relation Age of Onset  . Cancer Mother     Died from stomach cancer and "flesh eating rash  . Heart failure Father     Died in 88s from an MI  . Alcohol abuse Sister     Twin sister drinks a lot, as did both her parents and brothers  . Stroke Brother     Has 7 brothers, 1 with CVA   History   Social History  . Marital Status: Divorced    Spouse Name: N/A    Number of Children: N/A  . Years of Education: N/A   Occupational History  . Not on file.   Social History Main Topics  . Smoking status: Former Smoker    Types: Cigarettes    Quit date: 09/20/2010  . Smokeless tobacco: Never Used  . Alcohol Use: Yes     drinks a 5th per day  . Drug Use: Yes    Special: Marijuana, Cocaine     HX  USE   . Sexually Active: Not Currently   Other Topics Concern  . Not on file   Social History Narrative   Lives with her significant other and 2 grandchildren.Has 7 brothers and 4 sisters, 1 twin sister.Unemployed, worked in [employer]. Abuses alcoholNo drug use.    A/P: 1. Seizure disorder, new. Likely due to electrolyte disturbance r/t alcohol, cocaine use. -compliant with Keppra and Neurontin -no new seizure activity -will repeat C-met, Mg and Keppra  levels -follow up with a Neurologist per Hospital D/C summary by Dr. Bosie Clos.  2. 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 -I declined the patient's request for Oxycodone and Hydrocodone --  rationale explained. -UDS -Diclofenac 75 mg PO bid PRN with meals diclofenac topical gel apply to incision line PRN  3. Hx of alcohol and cocaine abuse -per patient, abstinent for 4 weeks.  -patient refused  A referral for a substance abuse cessation programs.  F/U in 4 weeks or sooner.

## 2011-09-18 NOTE — Patient Instructions (Signed)
Please, DO not use any street drugs including alcohol and cocaine. You will not be prescribed any opiates from this office due to risk of medical complications. You are being prescribed Diclofenac -gel to use topically and by mouth --to control your pain. Please, follow up with your surgeon and a neurologist as instructed. Please, call with any questions and follow up in 4-8 weeks or sooner. Please, do not drive and/or operate any machinery.

## 2011-09-19 LAB — PRESCRIPTION ABUSE MONITORING 15P, URINE
Barbiturate Screen, Urine: NEGATIVE ng/mL
Benzodiazepine Screen, Urine: NEGATIVE ng/mL
Cocaine Metabolites: NEGATIVE ng/mL
Creatinine, Urine: 270.2 mg/dL (ref >20.0)
Meperidine, Ur: NEGATIVE ng/mL
Methadone Screen, Urine: NEGATIVE ng/mL
Opiate Screen, Urine: NEGATIVE ng/mL
Tramadol Scrn, Ur: NEGATIVE ng/mL

## 2011-09-20 LAB — LEVETIRACETAM LEVEL

## 2011-09-28 ENCOUNTER — Encounter (INDEPENDENT_AMBULATORY_CARE_PROVIDER_SITE_OTHER): Payer: PRIVATE HEALTH INSURANCE | Admitting: General Surgery

## 2011-10-11 ENCOUNTER — Other Ambulatory Visit (INDEPENDENT_AMBULATORY_CARE_PROVIDER_SITE_OTHER): Payer: Self-pay | Admitting: General Surgery

## 2011-10-11 ENCOUNTER — Ambulatory Visit (INDEPENDENT_AMBULATORY_CARE_PROVIDER_SITE_OTHER): Payer: PRIVATE HEALTH INSURANCE | Admitting: General Surgery

## 2011-10-11 ENCOUNTER — Encounter (INDEPENDENT_AMBULATORY_CARE_PROVIDER_SITE_OTHER): Payer: Self-pay | Admitting: General Surgery

## 2011-10-11 VITALS — BP 119/70 | HR 80 | Temp 98.6°F | Resp 14 | Ht 61.0 in | Wt 81.0 lb

## 2011-10-11 DIAGNOSIS — D219 Benign neoplasm of connective and other soft tissue, unspecified: Secondary | ICD-10-CM

## 2011-10-11 DIAGNOSIS — K6389 Other specified diseases of intestine: Secondary | ICD-10-CM

## 2011-10-11 NOTE — Progress Notes (Signed)
Subjective:     Patient ID: [name], female   DOB: [birth year], 56 y.o.   MRN: [id number]  HPI The patient is a 56 year old white female who is about 5 weeks out from a laparoscopic assisted right colectomy. She is doing well. Most of her abdominal discomfort has resolved. Her appetite is good her bowels are working normally.  Review of Systems     Objective:   Physical Exam On exam her abdomen is soft and nontender. Her right transverse incision is healing nicely.    Assessment:     5 weeks status post laparoscopic-assisted right colectomy    Plan:     At this point she seems to be doing well. I believe next week she can begin returning to her normal activities without any restrictions. 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 We will refer her to the medical oncologist to make sure that they agree that this is a benign tumor of her colon And to see if any other treatment is necessary

## 2011-10-11 NOTE — Patient Instructions (Signed)
May return to all normal activities 

## 2011-10-13 ENCOUNTER — Telehealth: Payer: Self-pay | Admitting: Oncology

## 2011-10-13 ENCOUNTER — Ambulatory Visit (INDEPENDENT_AMBULATORY_CARE_PROVIDER_SITE_OTHER): Payer: PRIVATE HEALTH INSURANCE | Admitting: *Deleted

## 2011-10-13 ENCOUNTER — Telehealth (INDEPENDENT_AMBULATORY_CARE_PROVIDER_SITE_OTHER): Payer: Self-pay | Admitting: General Surgery

## 2011-10-13 DIAGNOSIS — D518 Other vitamin B12 deficiency anemias: Secondary | ICD-10-CM

## 2011-10-13 MED ORDER — CYANOCOBALAMIN 1000 MCG/ML IJ SOLN
1000.0000 ug | Freq: Once | INTRAMUSCULAR | Status: AC
  Administered 2011-10-13: 1000 ug via INTRAMUSCULAR

## 2011-10-13 NOTE — Telephone Encounter (Signed)
Spoke with pt and informed her I reschedule her apt time to 6/3 at 9:50 in the morning.  Pt is aware of this

## 2011-10-13 NOTE — Telephone Encounter (Signed)
lmonvm adviisng the pt of her new pt att with dr Clelia Croft the week of 10/23/2011,. Asked the pt to call me back to confirm

## 2011-10-16 ENCOUNTER — Telehealth: Payer: Self-pay | Admitting: Oncology

## 2011-10-16 NOTE — Telephone Encounter (Signed)
S/w the pt and she is aware of her new pt appt on 10/26/2011@1 :30pm

## 2011-10-17 ENCOUNTER — Telehealth: Payer: Self-pay | Admitting: Oncology

## 2011-10-17 NOTE — Telephone Encounter (Signed)
Referred by Dr. Chevis Pretty Dx- Granular Cell tumor

## 2011-10-23 ENCOUNTER — Other Ambulatory Visit: Payer: Self-pay | Admitting: Oncology

## 2011-10-23 ENCOUNTER — Telehealth: Payer: Self-pay | Admitting: *Deleted

## 2011-10-23 DIAGNOSIS — Z86018 Personal history of other benign neoplasm: Secondary | ICD-10-CM

## 2011-10-23 NOTE — Telephone Encounter (Signed)
Received a call from NP who works for [employer].  She saw pt for a yearly visit and had 2 concerns she wanted to make you aware of  ! - Pt's weight was 87  Pounds.  I checked last OV here and weight was 85 pounds.  2 - Also pt told NP that she had a seizure about 2 months.  She states pt is not on any meds for this.  I checked record and see pt is on Keppra      500 mg bid last filled 09/07/11.    This call was an Lorain Childes, pt last seen in January 2013  NP #   [phone]

## 2011-10-23 NOTE — Telephone Encounter (Signed)
Please, have the patient make an appointment with the first available provider. Thank you.

## 2011-10-25 NOTE — Telephone Encounter (Signed)
Appointment given for tomorrow 

## 2011-10-25 NOTE — Telephone Encounter (Signed)
Unable to reach pt. Message left with family member for pt to call back.

## 2011-10-26 ENCOUNTER — Telehealth: Payer: Self-pay | Admitting: Oncology

## 2011-10-26 ENCOUNTER — Encounter: Payer: Self-pay | Admitting: Oncology

## 2011-10-26 ENCOUNTER — Ambulatory Visit (INDEPENDENT_AMBULATORY_CARE_PROVIDER_SITE_OTHER): Payer: PRIVATE HEALTH INSURANCE | Admitting: Internal Medicine

## 2011-10-26 ENCOUNTER — Other Ambulatory Visit (HOSPITAL_BASED_OUTPATIENT_CLINIC_OR_DEPARTMENT_OTHER): Payer: PRIVATE HEALTH INSURANCE | Admitting: Lab

## 2011-10-26 ENCOUNTER — Ambulatory Visit (HOSPITAL_BASED_OUTPATIENT_CLINIC_OR_DEPARTMENT_OTHER): Payer: PRIVATE HEALTH INSURANCE | Admitting: Oncology

## 2011-10-26 ENCOUNTER — Encounter: Payer: Self-pay | Admitting: Internal Medicine

## 2011-10-26 ENCOUNTER — Ambulatory Visit: Payer: PRIVATE HEALTH INSURANCE

## 2011-10-26 ENCOUNTER — Other Ambulatory Visit: Payer: Self-pay | Admitting: Oncology

## 2011-10-26 ENCOUNTER — Other Ambulatory Visit: Payer: PRIVATE HEALTH INSURANCE | Admitting: Lab

## 2011-10-26 VITALS — BP 102/75 | HR 100 | Temp 98.2°F | Ht 61.0 in | Wt 79.9 lb

## 2011-10-26 VITALS — BP 110/77 | HR 97 | Temp 98.1°F | Ht 61.0 in | Wt 80.1 lb

## 2011-10-26 DIAGNOSIS — G8929 Other chronic pain: Secondary | ICD-10-CM

## 2011-10-26 DIAGNOSIS — Z8719 Personal history of other diseases of the digestive system: Secondary | ICD-10-CM

## 2011-10-26 DIAGNOSIS — G40909 Epilepsy, unspecified, not intractable, without status epilepticus: Secondary | ICD-10-CM

## 2011-10-26 DIAGNOSIS — K6389 Other specified diseases of intestine: Secondary | ICD-10-CM

## 2011-10-26 DIAGNOSIS — Z86018 Personal history of other benign neoplasm: Secondary | ICD-10-CM

## 2011-10-26 DIAGNOSIS — I10 Essential (primary) hypertension: Secondary | ICD-10-CM

## 2011-10-26 DIAGNOSIS — D649 Anemia, unspecified: Secondary | ICD-10-CM

## 2011-10-26 DIAGNOSIS — R634 Abnormal weight loss: Secondary | ICD-10-CM

## 2011-10-26 DIAGNOSIS — D509 Iron deficiency anemia, unspecified: Secondary | ICD-10-CM

## 2011-10-26 DIAGNOSIS — D696 Thrombocytopenia, unspecified: Secondary | ICD-10-CM

## 2011-10-26 DIAGNOSIS — G894 Chronic pain syndrome: Secondary | ICD-10-CM

## 2011-10-26 LAB — CBC WITH DIFFERENTIAL/PLATELET
BASO%: 0.3 % (ref 0.0–2.0)
EOS%: 1 % (ref 0.0–7.0)
MCH: 32.6 pg (ref 25.1–34.0)
MCHC: 33.7 g/dL (ref 31.5–36.0)
MCV: 96.6 fL (ref 79.5–101.0)
MONO%: 10.4 % (ref 0.0–14.0)
RBC: 2.98 10*6/uL — ABNORMAL LOW (ref 3.70–5.45)
RDW: 13.6 % (ref 11.2–14.5)
nRBC: 0 % (ref 0–0)

## 2011-10-26 LAB — COMPREHENSIVE METABOLIC PANEL
Alkaline Phosphatase: 178 U/L — ABNORMAL HIGH (ref 39–117)
Glucose, Bld: 116 mg/dL — ABNORMAL HIGH (ref 70–99)
Sodium: 136 mEq/L (ref 135–145)
Total Bilirubin: 0.4 mg/dL (ref 0.3–1.2)
Total Protein: 8.2 g/dL (ref 6.0–8.3)

## 2011-10-26 MED ORDER — GABAPENTIN 300 MG PO CAPS: 300.0000 mg | ORAL_CAPSULE | Freq: Three times a day (TID) | ORAL | Status: DC

## 2011-10-26 MED ORDER — DICLOFENAC SODIUM 1 % TD GEL: 1.0000 "application " | Freq: Four times a day (QID) | TRANSDERMAL | Status: DC

## 2011-10-26 MED ORDER — OXYCODONE-ACETAMINOPHEN 5-325 MG PO TABS: 1.0000 | ORAL_TABLET | ORAL | Status: AC | PRN

## 2011-10-26 NOTE — Progress Notes (Signed)
Subjective:     [name], female   DOB: 08/22/1955, 56 y.o.   MRN: [id number]  HPI [name] [name] is a 56 year old woman with past medical history significant for hypertension, depression, chronic electrolyte abnormalities, new onset seizures recently started on Keppra, recent laparoscopic colectomy for cecal mass and history of alcohol abuse who presents to the clinic for a checkup. Patient was evaluated by her nurse practitioner who comes to her house once a year and was concerned about the patient's weight loss. According to the nurse practitioner the patient has had a documented 10 pound weight loss. However in the clinic her weight has only slightly decreased in the past 4 months. Patient reports that she had seizure activity and was started on antiseizure medication but was unable to tell the nurse practitioner which one it was. Given the confusion about her seizure medications as well as weight loss the nurse practitioner wanted the patient to be evaluated. 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 Patient has not had any complications from surgery other than slight abdominal pain. Regarding her seizures she has been taking her Keppra twice daily once in the afternoon and once at night. She was previously taking Keppra in the morning however it caused her to be nauseated and thus she changed it to taking it in the afternoon. No new seizure activity reported  Review of Systems  All other systems reviewed and are negative.       Objective:   Physical Exam  Constitutional:       Cachectic   HENT:  Head: Normocephalic and atraumatic.  Eyes: EOM are normal. Pupils are equal, round, and reactive to light.  Neck: Normal range of motion. Neck supple.  Cardiovascular: Regular rhythm and normal heart sounds.   Pulmonary/Chest: Effort normal and breath sounds normal.  Abdominal: Soft. She exhibits no distension and no mass. There is tenderness.  There is no guarding.  Musculoskeletal: Normal range of motion.

## 2011-10-26 NOTE — Assessment & Plan Note (Signed)
Lab Results  Component Value Date   NA 138 09/18/2011   K 3.8 09/18/2011   CL 104 09/18/2011   CO2 20 09/18/2011   BUN 8 09/18/2011   CREATININE 0.89 09/18/2011   CREATININE 0.83 09/07/2011    BP Readings from Last 3 Encounters:  10/26/11 102/75  10/11/11 119/70  09/18/11 113/79    Assessment: Hypertension control:  controlled  Progress toward goals:  at goal Barriers to meeting goals:  no barriers identified and On soft side but patient is asymptomatic, if it continues to run low, may need to reduce dosage of Norvasc or HCTZ.   Plan: Hypertension treatment:  continue current medications no barriers identified and On soft side but patient is asymptomatic, if it continues to run low, may need to reduce dosage of Norvasc or HCTZ.

## 2011-10-26 NOTE — Assessment & Plan Note (Signed)
Wt Readings from Last 3 Encounters:  10/26/11 79 lb 14.4 oz (36.242 kg)  10/11/11 81 lb (36.741 kg)  09/18/11 78 lb 11.2 oz (35.698 kg)   Stable, mild 6 lbs weight loss since January. Good appetite.

## 2011-10-26 NOTE — Patient Instructions (Signed)
Please follow up at cancer center as scheduled. Please take all medications as directed. Please follow up in 1-2 months.

## 2011-10-26 NOTE — Progress Notes (Signed)
Note dictated

## 2011-10-26 NOTE — Progress Notes (Signed)
Patient came in today as a new patient and she has one insurance ever care,she also said that her case worker at [employer] social [employer] is in the process of reinstating her medicaid.I ask her for the case worker name and number she did not have it with her today.

## 2011-10-26 NOTE — Assessment & Plan Note (Signed)
On Keppra BID. No new seizure activity reported. Continue at current dose. Exact etiology still unknown, may have been related to alcohol withdrawal. If she continues to abstain from alcohol and remains seizure free, may be able to get off AED.

## 2011-10-26 NOTE — Telephone Encounter (Signed)
Gave pt appt for September 2013 lab and ML °

## 2011-10-26 NOTE — Assessment & Plan Note (Signed)
As mentioned patient positive for cocaine back in November and December of 2012, but has stopped cocaine use since January. Her most recent UDS in March was negative. She is complaining of abdominal pain from recent surgery, thus will give her one time script of 20 percocet tablets and instructed her that she was not to receive any refills. Pt expressed understanding of this.

## 2011-10-27 NOTE — Progress Notes (Signed)
CC:   [name] [name]. [name] [name], M.D. [name] Quitter, MD  REASON FOR CONSULTATION:  Colon tumor, as well as anemia.  HISTORY OF PRESENT ILLNESS:  [name] [name] is a pleasant 56 year old African American woman, native of [place], lived the majority of her life around this area.  Has had multiple occupations in the past, predominantly [employer], currently works about 2 days a week. She has a past medical history that is significant for polysubstance abuse, as well as hypertension and she described feeling relatively sick for the last year or so.  However, most recently she had started noting more weakness, fatigue, tiredness, and she developed anemia.  She underwent a colonoscopy and found to have a cecal mass.  A biopsy proved to be a granular cell tumor.  The patient was referred to Dr. Carolynne Edouard with [facility] and on March 25th, she underwent a laparoscopic right colectomy that was again successfully completed without any major complication.  The patient was discharged in good condition on September 04, 2011.  The pathology revealed a granular cell tumor, 2 cm in the greatest dimension without any evidence of any malignancy.  Thirteen lymph nodes were examined.  Again, they were all benign.  Case number was [id number].  The patient again did relatively well.  She was hospitalized on September 05, 2011, with a seizure and thought to be maybe possibly related to alcohol withdrawal, maybe benzodiazepine withdrawal. The patient was discharged on April 4 after her resolution of her symptoms and she had a normal EEG and neurological examination.  Since that episode, she has been doing relatively well __________.  Resumed work-related duties and she was referred to me for evaluation regarding further management of her granular cell tumor.  Clinically she reports her abdominal pain has nearly resolved.  Her appetite is improving and her energy is better.  She takes an iron supplement on a  regular basis without any dyspepsia or hematochezia or melena.  REVIEW OF SYSTEMS:  Does not report any headaches, blurry vision, double vision.  Does not report any motor or sensory neuropathy.  Does not report any alteration in mental status.  Does not report any psychiatric issues or depression.  Does not report any fever, chills, sweats.  Does not report any cough, hemoptysis, hematemesis.  No nausea or vomiting. No abdominal pain.  No hematochezia.  No melena or genitourinary complaints.  The rest of review of systems is unremarkable.  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  MEDICATIONS:  She is on amlodipine, calcium supplements, vitamin B12, Voltaren, diclofenac, Prozac, folic acid, Neurontin, hydrochlorothiazide, is on Keppra, Percocet, Phenergan, and vitamin B12.  ALLERGIES:  Amitriptyline and doxycycline.  SOCIAL HISTORY:  She is single.  She has 1 daughter.  Quit smoking about 8 months ago.  Drinks occasionally still.  Used to be a heavy drinker.  FAMILY HISTORY:  Her mother had gastric cancer, although no further details provided.  Father died of heart disease.  PHYSICAL EXAMINATION:  General:  Alert, awake female, appeared in no active distress.  Vital Signs:  Blood pressure today is 110/77, pulse 97, respirations 18.  She weighs 80 pounds.  HEENT:  Head is normocephalic, atraumatic.  Pupils equal, round, reactive to light. Oral mucosa moist and pink.  Neck:  Supple without adenopathy.  Heart: Regular rate and rhythm, S1 and S2.  Lungs:  Clear to auscultation.  No rhonchi, wheezes, or dullness to percussion.  Abdomen:  Soft, nontender. No hepatosplenomegaly.  Extremities:  No clubbing, cyanosis, or edema. Neurologically:  Intact motor, sensory, and deep tendon reflexes.  LABORATORY DATA:   Showed a hemoglobin of 9.7, white cell count of 7.2, platelet count of 125.  ASSESSMENT AND PLAN:  A 56 year old woman with the following issues. 1. Granular cell tumor of the cecum, status post right hemicolectomy.     I do concur with Dr. Carolynne Edouard this is a benign tumor.  Usually with     adequate resection, which she seems to have had, I do not think     really any further therapy is warranted.  There is no role for     adjuvant chemotherapy or radiation.  __________ surveillance, again     these tumors rarely recur after adequate resection.  Probably she     will need repeat colonoscopy in the future to make sure she does     not have any local recurrence of any other tumors and certainly she     will follow up with Gastroenterology regarding that.  There is     really no indication for imaging studies, laboratory testing, or     tumor markers. 2. Anemia that is multifactorial.  She has an element of iron     deficiency.  She is on iron supplements now.  She seems to be doing     fairly well.  Her hemoglobin has responded from 8 to 9.7, however,     I suspect that she might have some iron absorption issue in the     future.  I would recheck her iron stores in the future and     certainly we can discuss iron supplementation intravenously if she     does not get any p.o. iron benefit. 3. Thrombocytopenia.  This is rather mild.  It is probably related to     her polysubstance abuse and splenomegaly.    ______________________________ Benjiman Core, M.D. FNS/MEDQ  D:  10/26/2011  T:  10/26/2011  Job:  [id number]

## 2011-11-02 ENCOUNTER — Ambulatory Visit: Payer: PRIVATE HEALTH INSURANCE | Admitting: Neurology

## 2011-11-06 ENCOUNTER — Encounter (INDEPENDENT_AMBULATORY_CARE_PROVIDER_SITE_OTHER): Payer: PRIVATE HEALTH INSURANCE | Admitting: General Surgery

## 2011-11-07 ENCOUNTER — Encounter (INDEPENDENT_AMBULATORY_CARE_PROVIDER_SITE_OTHER): Payer: PRIVATE HEALTH INSURANCE | Admitting: General Surgery

## 2011-11-08 ENCOUNTER — Encounter: Payer: Self-pay | Admitting: Dietician

## 2011-11-08 ENCOUNTER — Telehealth: Payer: Self-pay | Admitting: Nutrition

## 2011-11-08 NOTE — Progress Notes (Signed)
Brief Out-patient Oncology Nutrition Note  Reason: Positive nutrition risk screen for unintentional weight loss and decreased appetite.  [name] [name] is a 56 year old female patient of Dr. Clelia Croft, diagnosed with colon mass. Attempted to contact patient via telephone number for nutrition risk. Patient's daughter answered and reported patient not home and asked if I could leave a message. Left message to contact RD for nutrition appointment.   Height: 61" Weight: 80 lb. BMI: 15.1 kg/m^2 (Underweight)  Wt Readings from Last 10 Encounters:  10/26/11 80 lb 1.6 oz (36.333 kg)  10/26/11 79 lb 14.4 oz (36.242 kg)  10/11/11 81 lb (36.741 kg)  09/18/11 78 lb 11.2 oz (35.698 kg)  09/06/11 83 lb 5.3 oz (37.8 kg)  08/29/11 82 lb (37.195 kg)  08/29/11 82 lb (37.195 kg)  08/21/11 84 lb 4.8 oz (38.238 kg)  08/07/11 82 lb 9.6 oz (37.467 kg)  06/28/11 85 lb 14.4 oz (38.964 kg)   RD available for nutrition needs.   Iven Finn [facility] [phone]

## 2011-11-08 NOTE — Telephone Encounter (Signed)
Patient called back and left a message that I could call her to make a nutrition appointment.  I talked with patient and scheduled a nutrition appointment for Thursday, June 6.

## 2011-11-09 ENCOUNTER — Ambulatory Visit: Payer: PRIVATE HEALTH INSURANCE | Admitting: Nutrition

## 2011-11-09 NOTE — Assessment & Plan Note (Signed)
[name] is a 56 year old female patient of Dr. Alver Fisher diagnosed with a colon mass and associated anemia, status post right hemicolectomy.  HISTORY:  Includes polysubstance abuse including heavy alcohol usage, hypertension, seizure, pancreatitis, questionable hepatitis and liver cirrhosis.  MEDICATIONS INCLUDE:  pangestyme, calcium gluconate, Vitamin D2, Prozac, Folvite, magnesium oxide, multivitamin, Phenergan, vitamin B1.  LABORATORIES:  Labs include glucose of 116.  HEIGHT:  61 inches. WEIGHT:  81.6 pounds, documented today. USUAL BODY WEIGHT:  110 pounds. BMI:  15.43.  The patient presents early to her nutrition appointment.  She states that she does eat 3 meals a day and she does snack a little bit in between meals.  She states she drinks regular Ensure twice a day.  She verbalizes distress about how low her weight is and she is hoping that she can incorporate some strategies to gain some weight.   NUTRITION DIAGNOSIS:  Food and nutrition-related knowledge deficit related to lack of prior exposure to accurate nutrition-related information as evidenced by patient verbalizing inaccurate or incomplete information.  INTERVENTION:  I have educated this patient on the importance of increased calories and protein to promote weight gain.  I have discussed Ways she could add foods that would increase the nutrient density of her diet, but also add calories for weight gain. I have encouraged her to drink whole milk with [employer] Essentials with her meals.  She is to continue Ensure Plus at least b.i.d.  I have encouraged more protein sources throughout the day.  I have provided fact sheets, nutritional supplement samples, coupons, and my contact information for patient.  MONITORING/EVALUATION (GOALS):  That patient will tolerate increased calories and protein to promote weight gain.  NEXT VISIT:  Tuesday, July 9th.    ______________________________ Zenovia Jarred, RD, LDN Clinical  Nutrition Specialist BN/MEDQ  D:  11/09/2011  T:  11/09/2011  Job:  [id number]

## 2011-11-10 ENCOUNTER — Telehealth: Payer: Self-pay | Admitting: *Deleted

## 2011-11-10 ENCOUNTER — Emergency Department (HOSPITAL_COMMUNITY)
Admission: EM | Admit: 2011-11-10 | Discharge: 2011-11-11 | Disposition: A | Discharge status: Home / Self Care | Payer: PRIVATE HEALTH INSURANCE | Attending: Emergency Medicine | Admitting: Emergency Medicine

## 2011-11-10 ENCOUNTER — Encounter (HOSPITAL_COMMUNITY): Payer: Self-pay | Admitting: *Deleted

## 2011-11-10 DIAGNOSIS — R42 Dizziness and giddiness: Secondary | ICD-10-CM | POA: Insufficient documentation

## 2011-11-10 MED ORDER — SODIUM CHLORIDE 0.9 % IV BOLUS (SEPSIS): 500.0000 mL | Freq: Once | INTRAVENOUS | Status: DC

## 2011-11-10 MED ORDER — SODIUM CHLORIDE 0.9 % IV BOLUS (SEPSIS): 1000.0000 mL | Freq: Once | INTRAVENOUS | Status: DC

## 2011-11-10 NOTE — Telephone Encounter (Signed)
Pt called asking for an appointment today because of dizziness.  Onset this AM. Unable to reach pt at either phone #     Called pt again and she is being seen in ED.

## 2011-11-10 NOTE — ED Notes (Signed)
Patient reports she woke up with dizziness.  Patient denies headache,  Denies chest pain.   She denies sob,  Denies n/v

## 2011-12-12 ENCOUNTER — Ambulatory Visit: Payer: PRIVATE HEALTH INSURANCE | Admitting: Nutrition

## 2011-12-12 NOTE — Progress Notes (Signed)
Ms. Heck returns for nutrition followup.  Her weight has decreased 2 pounds to 79.6 pounds today from 81.6 pounds June 6.  The patient states she feels like she has been eating better.  She has been drinking Ensure Plus 3 times a day.  Her diet, however, is low in calories.  She consumes cereal and fruit to the morning and usually a protein source with salads and fruit the rest of the day.  The patient denies nutrition issues with oral intake.  NUTRITION DIAGNOSIS:  Food and nutrition related knowledge deficit continues but has improved.  INTERVENTION:  I have recommended the patient increase Ensure Plus q.i.d.  I have provided her with some additional samples along with samples of [employer] Breakfast Essentials for her to mix with whole milk.  I have given her a list of specific higher calorie, higher protein foods for her to incorporate throughout the day with her fruits and vegetables.  The patient appreciative of information.  MONITORING/EVALUATION/GOALS:  The patient will tolerate increased calories and protein to promote weight gain.  NEXT VISIT:  Tuesday, August 13.    ______________________________ Zenovia Jarred, RD, LDN Clinical Nutrition Specialist BN/MEDQ  D:  12/12/2011  T:  12/12/2011  Job:  [id number]

## 2011-12-14 ENCOUNTER — Encounter (INDEPENDENT_AMBULATORY_CARE_PROVIDER_SITE_OTHER): Payer: Self-pay | Admitting: General Surgery

## 2011-12-14 ENCOUNTER — Ambulatory Visit (INDEPENDENT_AMBULATORY_CARE_PROVIDER_SITE_OTHER): Payer: PRIVATE HEALTH INSURANCE | Admitting: General Surgery

## 2011-12-14 VITALS — BP 102/66 | HR 72 | Temp 97.8°F | Resp 12 | Ht 61.0 in | Wt 79.6 lb

## 2011-12-14 DIAGNOSIS — K6389 Other specified diseases of intestine: Secondary | ICD-10-CM

## 2011-12-14 NOTE — Progress Notes (Signed)
Subjective:     [name], female   DOB: Mar 16, 1956, 56 y.o.   MRN: [id number]  HPI The patient is a 56 year old black female who is 2 months status post a right colectomy for a benign tumor. She is doing very well today and has no complaints. She denies any abdominal pain. Her appetite is good and her bowels are working normally.  Review of Systems     Objective:   Physical Exam On exam her abdomen is soft and nontender. Her incisions have healed nicely with no sign of infection.   Assessment:     2 months status post right colectomy for benign tumor    Plan:     At this point I believe she can return to her normal activities without restrictions. We will plan to see her back on a p.r.n. basis. She will need to continue to followup with her medical doctor and gastroenterologist

## 2011-12-14 NOTE — ED Provider Notes (Signed)
History     CSN: [id number]  Arrival date & time 09/05/11  1417   First MD Initiated Contact with Patient 09/05/11 1508      Chief Complaint  Patient presents with  . Seizures    (Consider location/radiation/quality/duration/timing/severity/associated sxs/prior treatment) HPI Patient is a 56 y.o. 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  Pt brought to ED by EMS. patient had been in her normal state of health since hospital discharge 09/04/11 when about 8 am she fell to floor and "had a seizure". The episode lasted less than 1 minute, EMS was called but pt refused transport to ED. She reportedly returned to her normal baseline until about 2 pm when she suffered a second seizure episode while lying on the couch. Her sister called EMS and the pt was able to be convinced to be transported to the ED.  Pt reports that she has been without alcohol for over a week and has never had "seizure medicine".    Past Medical History  Diagnosis Date  . Anemia, B12 deficiency   . History of acute pancreatitis   . Right knee pain     No recent imaging on chart  . 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 Dr. Su Hilt, [facility]  . Hypertriglyceridemia   . GERD (gastroesophageal reflux disease)   . Vitamin d deficiency   . Subdural hematoma 02/2008    Likely 2/2 trauma from seizure from EtOH withdrawal, chronic in nature, sees Dr. Robyne Askew. Most recent CT head 10/2009 showing stable but persistent hematoma without mass effect.  . History of seizure disorder     Likely 2/2 alcohol abuse  . Hypocalcemia   . Hypomagnesemia   . Failure to thrive in childhood     Unclear etiology  . HTN (hypertension)   . Thrombocytopenia   . Anemia, macrocytic   . Hepatomegaly     On exam  . Alcohol abuse   . Joint pain   . Alcohol  abuse   . Arthritis   . Vitamin d deficiency   . Menopause   . Pancreatitis   . Insomnia   . Hyperlipidemia   . Sinusitis   . Pernicious anemia   . Subdural hematoma   . Macrocytic anemia   . Hepatomegaly   . 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 Seizures     1.5 YRS  LAST ONE  . Depression   . Hepatitis     HEPATOMEGALY   . Fx humeral neck 04/17/2011    Transverse fracture- minimally displaced- managed as outpatient   . ABNORMAL PAP SMEAR, LGSIL 07/23/2008    Annotation: HPV positive CIN I Dr. Su Hilt, [facility] Qualifier: Diagnosis of  By: Danae Chen      Past Surgical History  Procedure Date  . Cesarean section 1983  . Esophagogastroduodenoscopy 07/11/2011    Procedure: ESOPHAGOGASTRODUODENOSCOPY (EGD);  Surgeon: Theda Belfast, MD;  Location: [facility];  Service: Endoscopy;  Laterality: N/A;  . Colonoscopy 07/11/2011    Procedure: COLONOSCOPY;  Surgeon: Theda Belfast, MD;  Location: [facility];  Service: Endoscopy;  Laterality: N/A;  . Eye surgery     LEFT EYE YRS AGO   . Rt colectomy 08/28/2011    Family History  Problem Relation Age of Onset  . Cancer Mother  Died from stomach cancer and "flesh eating rash  . Heart failure Father     Died in 37s from an MI  . Alcohol abuse Sister     Twin sister drinks a lot, as did both her parents and brothers  . Stroke Brother     Has 7 brothers, 1 with CVA    History  Substance Use Topics  . Smoking status: Former Smoker    Types: Cigarettes    Quit date: 09/20/2010  . Smokeless tobacco: Never Used  . Alcohol Use: Yes     drinks a 5th per day    OB History    Grav Para Term Preterm Abortions TAB SAB Ect Mult Living                  Review of Systems   HEENT: denies blurry vision or change in hearing PULMONARY: Denies difficulty breathing and SOB CARDIAC: denies chest pain or heart palpitations MUSCULOSKELETAL:  denies being unable to ambulate ABDOMEN AL: denies abdominal  pain GU: denies loss of bowel or urinary control NEURO: denies numbness and tingling in extremities SKIN: no new rashes PSYCH: patient denies anxiety or depression. NECK: Pt denies having neck pain     Allergies  Amitriptyline hcl and Doxycycline hyclate  Home Medications   Current Outpatient Rx  Name Route Sig Dispense Refill  . AMLODIPINE BESYLATE 10 MG PO TABS Oral Take 10 mg by mouth daily.    . AMYLASE-LIPASE-PROTEASE 20-4.5-25 MU PO CPEP Oral Take by mouth 3 (three) times daily with meals.     Marland Kitchen CALCIUM GLUCONATE 500 MG PO TABS Oral Take 500 mg by mouth daily.    . CYANOCOBALAMIN 1000 MCG/ML IJ SOLN Intramuscular Inject 1,000 mcg into the muscle every 30 (thirty) days. Last day of month. Last dose last month    . DICLOFENAC SODIUM 1 % TD GEL Topical Apply 1 application topically 4 (four) times daily.    Marland Kitchen FLUOXETINE HCL 10 MG PO CAPS Oral Take 10 mg by mouth daily.     . IBUPROFEN 100 MG PO TABS Oral Take 200 mg by mouth 2 (two) times daily as needed. For pain.    Marland Kitchen MAGNESIUM OXIDE 400 MG PO TABS Oral Take 400 mg by mouth 2 (two) times daily.    Marland Kitchen PROMETHAZINE HCL 12.5 MG PO TABS Oral Take 12.5 mg by mouth 4 (four) times daily as needed. For nausea    . VITAMIN B-1 100 MG PO TABS Oral Take 100 mg by mouth 2 (two) times daily.     Marland Kitchen DICLOFENAC SODIUM 75 MG PO TBEC Oral Take 1 tablet (75 mg total) by mouth 2 (two) times daily with a meal. 60 tablet 2  . ENSURE COMPLETE PO LIQD Oral Take 237 mLs by mouth 3 (three) times daily between meals. 24 Bottle 11  . FOLIC ACID 1 MG PO TABS Oral Take 1 tablet (1 mg total) by mouth daily. 30 tablet 11  . GABAPENTIN 300 MG PO CAPS Oral Take 1 capsule (300 mg total) by mouth 3 (three) times daily. 90 capsule 3  . HYDROCHLOROTHIAZIDE 25 MG PO TABS Oral Take 25 mg by mouth daily.     Marland Kitchen LEVETIRACETAM 500 MG PO TABS Oral Take 1 tablet (500 mg total) by mouth 2 (two) times daily. 60 tablet 11  . ADULT MULTIVITAMIN W/MINERALS CH Oral Take 1 tablet by  mouth daily. 30 tablet 11  . VITAMIN D (ERGOCALCIFEROL) 50000 UNITS PO CAPS Oral Take  50,000 Units by mouth every 7 (seven) days. Take on Thursdays.      BP 114/57  Pulse 103  Temp 98.6 F (37 C) (Oral)  Resp 20  Ht 5\' 1"  (1.549 m)  Wt 83 lb 5.3 oz (37.8 kg)  BMI 15.75 kg/m2  SpO2 99%  Physical Exam  Nursing note and vitals reviewed. Constitutional: She appears well-developed and well-nourished. No distress.  HENT:  Head: Normocephalic and atraumatic.  Eyes: Pupils are equal, round, and reactive to light.  Neck: Normal range of motion. Neck supple.  Cardiovascular: Normal rate and regular rhythm.   Pulmonary/Chest: Effort normal.  Abdominal: Soft.  Neurological: She is alert.  Skin: Skin is warm and dry.    ED Course  Procedures (including critical care time)  Labs Reviewed  BASIC METABOLIC PANEL - Abnormal; Notable for the following:    CO2 18 (*)     Glucose, Bld 128 (*)     Calcium 6.6 (*)     GFR calc non Af Amer 64 (*)     GFR calc Af Amer 74 (*)     All other components within normal limits  CBC - Abnormal; Notable for the following:    RBC 2.57 (*)     Hemoglobin 8.6 (*)     HCT 25.8 (*)     MCV 100.4 (*)     All other components within normal limits  HEPATIC FUNCTION PANEL - Abnormal; Notable for the following:    Total Protein 8.5 (*)     Albumin 2.4 (*)     Total Bilirubin 0.2 (*)     All other components within normal limits  URINALYSIS, ROUTINE W REFLEX MICROSCOPIC - Abnormal; Notable for the following:    Leukocytes, UA TRACE (*)     All other components within normal limits  URINE MICROSCOPIC-ADD ON - Abnormal; Notable for the following:    Squamous Epithelial / LPF FEW (*)     All other components within normal limits  BASIC METABOLIC PANEL - Abnormal; Notable for the following:    Glucose, Bld 152 (*)     Calcium 6.8 (*)     GFR calc non Af Amer 58 (*)     GFR calc Af Amer 67 (*)     All other components within normal limits  CBC -  Abnormal; Notable for the following:    RBC 2.43 (*)     Hemoglobin 8.0 (*)     HCT 24.2 (*)     All other components within normal limits  MAGNESIUM - Abnormal; Notable for the following:    Magnesium 0.8 (*)     All other components within normal limits  CALCIUM, IONIZED - Abnormal; Notable for the following:    Calcium, Ion 0.92 (*)     All other components within normal limits  VITAMIN B12 - Abnormal; Notable for the following:    Vitamin B-12 1160 (*)     All other components within normal limits  IRON AND TIBC - Abnormal; Notable for the following:    Iron 23 (*)     TIBC 189 (*)     Saturation Ratios 12 (*)     All other components within normal limits  FERRITIN - Abnormal; Notable for the following:    Ferritin 440 (*)     All other components within normal limits  RETICULOCYTES - Abnormal; Notable for the following:    RBC. 2.23 (*)     All other components within normal  limits  CBC - Abnormal; Notable for the following:    RBC 2.50 (*)     Hemoglobin 8.1 (*)     HCT 25.3 (*)     MCV 101.2 (*)     All other components within normal limits  BASIC METABOLIC PANEL - Abnormal; Notable for the following:    Calcium 8.1 (*)     GFR calc non Af Amer 79 (*)     All other components within normal limits  DIFFERENTIAL  AMMONIA  URINE RAPID DRUG SCREEN (HOSP PERFORMED)  MAGNESIUM  FOLATE  CALCIUM, IONIZED  LAB REPORT - SCANNED   No results found.   1. Seizure disorder   2. Hallucinations   3. SEIZURES, HX OF       MDM   Pt is in no acute distress but had seizure of unknown cause. She says that she has a history of seizures but does not take medications for them. Will admit patient for further work-up and evaluation of seizure to [facility].       Dorthula Matas, PA 12/18/11 1709

## 2011-12-14 NOTE — Patient Instructions (Signed)
Continue to follow up with medical doctor and gastroenterologist

## 2011-12-15 ENCOUNTER — Other Ambulatory Visit: Payer: Self-pay | Admitting: Internal Medicine

## 2011-12-18 NOTE — ED Provider Notes (Signed)
Medical screening examination/treatment/procedure(s) were performed by non-physician practitioner and as supervising physician I was immediately available for consultation/collaboration.   Loren Racer, MD 12/18/11 2329

## 2011-12-26 ENCOUNTER — Emergency Department (HOSPITAL_COMMUNITY)
Admission: EM | Admit: 2011-12-26 | Discharge: 2011-12-26 | Disposition: A | Discharge status: Home / Self Care | Payer: PRIVATE HEALTH INSURANCE | Attending: Emergency Medicine | Admitting: Emergency Medicine

## 2011-12-26 ENCOUNTER — Encounter (HOSPITAL_COMMUNITY): Payer: Self-pay | Admitting: Emergency Medicine

## 2011-12-26 ENCOUNTER — Other Ambulatory Visit: Payer: Self-pay

## 2011-12-26 DIAGNOSIS — E781 Pure hyperglyceridemia: Secondary | ICD-10-CM | POA: Insufficient documentation

## 2011-12-26 DIAGNOSIS — R55 Syncope and collapse: Secondary | ICD-10-CM | POA: Insufficient documentation

## 2011-12-26 DIAGNOSIS — F1020 Alcohol dependence, uncomplicated: Secondary | ICD-10-CM | POA: Insufficient documentation

## 2011-12-26 DIAGNOSIS — R64 Cachexia: Secondary | ICD-10-CM | POA: Insufficient documentation

## 2011-12-26 DIAGNOSIS — K219 Gastro-esophageal reflux disease without esophagitis: Secondary | ICD-10-CM | POA: Insufficient documentation

## 2011-12-26 DIAGNOSIS — Z87891 Personal history of nicotine dependence: Secondary | ICD-10-CM | POA: Insufficient documentation

## 2011-12-26 DIAGNOSIS — I10 Essential (primary) hypertension: Secondary | ICD-10-CM | POA: Insufficient documentation

## 2011-12-26 DIAGNOSIS — F3289 Other specified depressive episodes: Secondary | ICD-10-CM | POA: Insufficient documentation

## 2011-12-26 DIAGNOSIS — F329 Major depressive disorder, single episode, unspecified: Secondary | ICD-10-CM | POA: Insufficient documentation

## 2011-12-26 LAB — COMPREHENSIVE METABOLIC PANEL
ALT: 25 U/L (ref 0–35)
CO2: 16 mEq/L — ABNORMAL LOW (ref 19–32)
Calcium: 7.5 mg/dL — ABNORMAL LOW (ref 8.4–10.5)
GFR calc Af Amer: >90 mL/min (ref >90)
GFR calc non Af Amer: 81 mL/min — ABNORMAL LOW (ref >90)
Glucose, Bld: 86 mg/dL (ref 70–99)
Sodium: 141 mEq/L (ref 135–145)

## 2011-12-26 LAB — CBC
Hemoglobin: 9.1 g/dL — ABNORMAL LOW (ref 12.0–15.0)
MCH: 34.7 pg — ABNORMAL HIGH (ref 26.0–34.0)
MCV: 104.2 fL — ABNORMAL HIGH (ref 78.0–100.0)
RBC: 2.62 MIL/uL — ABNORMAL LOW (ref 3.87–5.11)

## 2011-12-26 MED ORDER — ACETAMINOPHEN 325 MG PO TABS
650.0000 mg | ORAL_TABLET | Freq: Once | ORAL | Status: AC
Start: 2011-12-26 — End: 2011-12-26
  Administered 2011-12-26: 650 mg via ORAL
  Filled 2011-12-26: qty 2

## 2011-12-26 MED ORDER — SODIUM CHLORIDE 0.9 % IV BOLUS (SEPSIS): 1000.0000 mL | Freq: Once | INTRAVENOUS | Status: DC

## 2011-12-26 NOTE — ED Notes (Signed)
Reports got out of bed and fell about 30 min ago, reports hx of seizures but states she did not have a seizure- reports lost balance and fell; no LOC-reports hit back of head on dresser; pt reports pain at R buttocks d/t  Hitting it when fell ; reports her husband wanted her to come into hospital d/t her being prone to falling; pt a&ox4

## 2011-12-26 NOTE — ED Provider Notes (Addendum)
History  This chart was scribed for Doug Sou, MD by Ladona Ridgel Day. This patient was seen in room TR06C/TR06C and the patient's care was started at 1800.   CSN: [id number]  Arrival date & time 12/26/11  1800   None     Chief Complaint  Patient presents with  . Fall    The history is provided by the patient. No language interpreter was used.   [name] [name] is a 56 y.o. female who presents to the Emergency Department complaining of a fall after she got out of bed because she was light headed and denies any LOC. She states she hurt her right buttocks when she fell. Nothing makes her pain worse or better. She states she has a history of seizures and denies having any seizure episodes today. No headache no chest pain no other associated symptoms. No blood per him Has had 3 similar episodes this past month. She becomes lightheaded with standing. She states she had sausage for breakfast and hamburger for lunch. She states pancreatitis from heavy drinking a few weeks ago. She denies smoking or illegal drugs and has no allergies.  Past Medical History  Diagnosis Date  . Anemia, B12 deficiency   . History of acute pancreatitis   . Right knee pain     No recent imaging on chart  . 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 Dr. Su Hilt, [facility]  . Hypertriglyceridemia   . GERD (gastroesophageal reflux disease)   . Vitamin d deficiency   . Subdural hematoma 02/2008    Likely 2/2 trauma from seizure from EtOH withdrawal, chronic in nature, sees Dr. Robyne Askew. Most recent CT head 10/2009 showing stable but persistent hematoma without mass effect.  . History of seizure disorder     Likely 2/2 alcohol abuse  . Hypocalcemia   . Hypomagnesemia   . Failure to thrive in childhood     Unclear etiology  . HTN (hypertension)   . Thrombocytopenia   . Anemia, macrocytic   . Hepatomegaly     On exam  . Alcohol abuse   . Joint  pain   . Alcohol abuse   . Arthritis   . Vitamin d deficiency   . Menopause   . Pancreatitis   . Insomnia   . Hyperlipidemia   . Sinusitis   . Pernicious anemia   . Subdural hematoma   . Macrocytic anemia   . Hepatomegaly   . 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 Seizures     1.5 YRS  LAST ONE  . Depression   . Hepatitis     HEPATOMEGALY   . Fx humeral neck 04/17/2011    Transverse fracture- minimally displaced- managed as outpatient   . ABNORMAL PAP SMEAR, LGSIL 07/23/2008    Annotation: HPV positive CIN I Dr. Su Hilt, [facility] Qualifier: Diagnosis of  By: Danae Chen      Past Surgical History  Procedure Date  . Cesarean section 1983  . Esophagogastroduodenoscopy 07/11/2011    Procedure: ESOPHAGOGASTRODUODENOSCOPY (EGD);  Surgeon: Theda Belfast, MD;  Location: [facility];  Service: Endoscopy;  Laterality: N/A;  . Colonoscopy 07/11/2011    Procedure: COLONOSCOPY;  Surgeon: Theda Belfast, MD;  Location: [facility];  Service: Endoscopy;  Laterality: N/A;  . Eye surgery     LEFT EYE YRS AGO   . Rt colectomy 08/28/2011    Family History  Problem Relation Age of Onset  . Cancer Mother     Died from stomach cancer and "flesh eating rash  . Heart failure Father     Died in 55s from an MI  . Alcohol abuse Sister     Twin sister drinks a lot, as did both her parents and brothers  . Stroke Brother     Has 7 brothers, 1 with CVA    History  Substance Use Topics  . Smoking status: Former Smoker    Types: Cigarettes    Quit date: 09/20/2010  . Smokeless tobacco: Never Used  . Alcohol Use: Yes     drinks a 5th per day    OB History    Grav Para Term Preterm Abortions TAB SAB Ect Mult Living                  Review of Systems  HENT: Negative.   Respiratory: Negative.   Cardiovascular: Negative.   Gastrointestinal: Negative.   Musculoskeletal: Negative.   Skin: Negative.   Neurological: Positive for light-headedness.    Hematological: Negative.   Psychiatric/Behavioral: Negative.   All other systems reviewed and are negative.    Allergies  Amitriptyline hcl and Doxycycline hyclate  Home Medications   Current Outpatient Rx  Name Route Sig Dispense Refill  . AMLODIPINE BESYLATE 10 MG PO TABS Oral Take 10 mg by mouth daily.    . AMYLASE-LIPASE-PROTEASE 20-4.5-25 MU PO CPEP Oral Take by mouth 3 (three) times daily with meals.     Marland Kitchen CALCIUM GLUCONATE 500 MG PO TABS Oral Take 500 mg by mouth daily.    . CYANOCOBALAMIN 1000 MCG/ML IJ SOLN Intramuscular Inject 1,000 mcg into the muscle every 30 (thirty) days. Last day of month. Last dose last month    . DICLOFENAC SODIUM 1 % TD GEL Topical Apply 1 application topically 4 (four) times daily.    Marland Kitchen FLUOXETINE HCL 10 MG PO CAPS Oral Take 10 mg by mouth daily.     Marland Kitchen FOLIC ACID 1 MG PO TABS Oral Take 1 tablet (1 mg total) by mouth daily. 30 tablet 11  . GABAPENTIN 300 MG PO CAPS Oral Take 1 capsule (300 mg total) by mouth 3 (three) times daily. 90 capsule 3  . HYDROCHLOROTHIAZIDE 25 MG PO TABS Oral Take 25 mg by mouth daily.     . IBUPROFEN 100 MG PO TABS Oral Take 200 mg by mouth 2 (two) times daily as needed. For pain.    Marland Kitchen LEVETIRACETAM 500 MG PO TABS Oral Take 500 mg by mouth 2 (two) times daily.    Marland Kitchen MAGNESIUM OXIDE 400 MG PO TABS Oral Take 400 mg by mouth 2 (two) times daily.    . ADULT MULTIVITAMIN W/MINERALS CH Oral Take 1 tablet by mouth daily. 30 tablet 11  . VITAMIN B-1 100 MG PO TABS Oral Take 100 mg by mouth 2 (two) times daily.     Marland Kitchen VITAMIN D (ERGOCALCIFEROL) 50000 UNITS PO CAPS Oral Take 50,000 Units by mouth every 7 (seven) days. Take on Thursdays.      Triage Vitals: BP 132/88  Pulse 98  Temp 98.5 F (36.9 C) (Oral)  Resp 16  SpO2 96%  Physical Exam  Nursing note and vitals reviewed. Constitutional:       Cachectic chronically ill-appearing  HENT:  Head: Normocephalic and atraumatic.  Right Ear: External ear normal.  Left Ear:  External ear normal.       Dry mucous membranes.  Eyes: Conjunctivae are normal. Pupils are equal, round, and reactive to light.  Neck: Neck supple. No tracheal deviation present. No thyromegaly present.  Cardiovascular: Normal rate and regular rhythm.   No murmur heard. Pulmonary/Chest: Effort normal and breath sounds normal.  Abdominal: Soft. Bowel sounds are normal. She exhibits no distension. There is no tenderness.  Musculoskeletal: Normal range of motion. She exhibits no edema and no tenderness.       Pelvis stable, nontender  Neurological: She is alert. She has normal reflexes. No cranial nerve deficit. Coordination normal.       Ambulatory upon exam and her gait is intact. Becomes lightheaded upon standing  Skin: Skin is warm and dry. No rash noted.  Psychiatric: She has a normal mood and affect. Thought content normal.    ED Course  Procedures (including critical care time) DIAGNOSTIC STUDIES: Oxygen Saturation is 96% on room air, adequate by my interpretation.    COORDINATION OF CARE: At 740 PM Discussed treatment plan with patient which includes IV fluids, and blood work. Patient agrees.   Labs Reviewed - No data to display No results found. No significant changes noted when compared with prior ECG.  No diagnosis found.   Date: 12/26/2011  Rate: 95  Rhythm: normal sinus rhythm O. Woods   QRS Axis: normal  Intervals: normal  ST/T Wave abnormalities: nonspecific T wave changes  Conduction Disutrbances none  Narrative Interpretation:   Old EKG Reviewed: unchanged   MDM  tocdu, IV hydration. Check electrolytes LFTs, CBC. EKG. Suspect orthostatic hypotension       Diagnosis near syncope I personally performed the services described in this documentation, which was scribed in my presence. The recorded information has been reviewed and considered.  Doug Sou, MD 12/26/11 2006 I.  Doug Sou, MD 12/26/11 2014

## 2011-12-26 NOTE — ED Provider Notes (Signed)
She feels better after fluids. Ambulatory without difficulty or further symptoms. Ready for discharge.   Rodena Medin, PA-C 12/26/11 2212

## 2011-12-26 NOTE — ED Notes (Addendum)
Pt reports she was getting out of bed and felt lightheaded then she lost her balance and fell. Denies LOC. C/o right hip pain. Pt also reports she has been having seizures lately and doesn't believe she had one this morning that caused her to fall, has hx of seizures.

## 2011-12-26 NOTE — ED Notes (Signed)
Patient walk to bathroom without any problems,patient stated that she feels a lot better.Nurse was informed.

## 2011-12-26 NOTE — ED Notes (Signed)
Blood for labs drawn from dorsal aspect of left hand

## 2011-12-28 NOTE — ED Provider Notes (Signed)
Medical screening examination/treatment/procedure(s) were conducted as a shared visit with non-physician practitioner(s) and myself.  I personally evaluated the patient during the encounter  Doug Sou, MD 12/28/11 1104

## 2012-01-06 ENCOUNTER — Other Ambulatory Visit: Payer: Self-pay | Admitting: Internal Medicine

## 2012-01-16 ENCOUNTER — Encounter: Payer: Self-pay | Admitting: Nutrition

## 2012-01-16 ENCOUNTER — Encounter: Payer: PRIVATE HEALTH INSURANCE | Admitting: Nutrition

## 2012-01-16 NOTE — Progress Notes (Signed)
Patient did not show for nutrition appointment scheduled January 16, 2012.

## 2012-01-19 ENCOUNTER — Encounter: Payer: PRIVATE HEALTH INSURANCE | Admitting: Internal Medicine

## 2012-02-02 NOTE — ED Provider Notes (Signed)
  On0 11/09/2011 I signed up for this patient and ordered basic labs. When I went in the exam room to see patient she had not been brough back yet. I waited approximately 30 minutes for the patient to arrive to exam room and she never did. This patient was not seen by myself or my attending on this day. No labs were resulted or drawn on this day. Therefore, I unassigned myself and my attending from the patients chart.  Dorthula Matas, PA 02/02/12 [phone]

## 2012-02-03 NOTE — ED Provider Notes (Signed)
Pt LWBS  Hurman Horn, MD 02/03/12 1755

## 2012-02-09 ENCOUNTER — Other Ambulatory Visit: Payer: Self-pay | Admitting: Internal Medicine

## 2012-02-14 ENCOUNTER — Ambulatory Visit (INDEPENDENT_AMBULATORY_CARE_PROVIDER_SITE_OTHER): Payer: PRIVATE HEALTH INSURANCE | Admitting: *Deleted

## 2012-02-14 DIAGNOSIS — D518 Other vitamin B12 deficiency anemias: Secondary | ICD-10-CM

## 2012-02-14 DIAGNOSIS — Z23 Encounter for immunization: Secondary | ICD-10-CM

## 2012-02-14 DIAGNOSIS — Z299 Encounter for prophylactic measures, unspecified: Secondary | ICD-10-CM

## 2012-02-14 MED ORDER — CYANOCOBALAMIN 1000 MCG/ML IJ SOLN
1000.0000 ug | Freq: Once | INTRAMUSCULAR | Status: AC
  Administered 2012-02-14: 1000 ug via INTRAMUSCULAR

## 2012-02-19 ENCOUNTER — Other Ambulatory Visit: Payer: Self-pay | Admitting: Internal Medicine

## 2012-02-19 DIAGNOSIS — Z1231 Encounter for screening mammogram for malignant neoplasm of breast: Secondary | ICD-10-CM

## 2012-02-27 ENCOUNTER — Ambulatory Visit: Payer: PRIVATE HEALTH INSURANCE | Admitting: Oncology

## 2012-02-27 ENCOUNTER — Other Ambulatory Visit: Payer: PRIVATE HEALTH INSURANCE | Admitting: Lab

## 2012-03-26 ENCOUNTER — Ambulatory Visit (INDEPENDENT_AMBULATORY_CARE_PROVIDER_SITE_OTHER): Payer: PRIVATE HEALTH INSURANCE | Admitting: Internal Medicine

## 2012-03-26 ENCOUNTER — Other Ambulatory Visit (HOSPITAL_COMMUNITY)
Admission: RE | Admit: 2012-03-26 | Discharge: 2012-03-26 | Disposition: A | Discharge status: Home / Self Care | Payer: PRIVATE HEALTH INSURANCE | Source: Ambulatory Visit | Attending: Internal Medicine | Admitting: Internal Medicine

## 2012-03-26 ENCOUNTER — Telehealth: Payer: Self-pay | Admitting: Internal Medicine

## 2012-03-26 ENCOUNTER — Encounter: Payer: Self-pay | Admitting: Internal Medicine

## 2012-03-26 VITALS — BP 132/94 | HR 108 | Temp 97.4°F | Ht 61.0 in | Wt 83.5 lb

## 2012-03-26 DIAGNOSIS — D7589 Other specified diseases of blood and blood-forming organs: Secondary | ICD-10-CM

## 2012-03-26 DIAGNOSIS — E559 Vitamin D deficiency, unspecified: Secondary | ICD-10-CM

## 2012-03-26 DIAGNOSIS — D509 Iron deficiency anemia, unspecified: Secondary | ICD-10-CM

## 2012-03-26 DIAGNOSIS — D649 Anemia, unspecified: Secondary | ICD-10-CM

## 2012-03-26 DIAGNOSIS — Z1151 Encounter for screening for human papillomavirus (HPV): Secondary | ICD-10-CM | POA: Insufficient documentation

## 2012-03-26 DIAGNOSIS — Z0000 Encounter for general adult medical examination without abnormal findings: Secondary | ICD-10-CM

## 2012-03-26 DIAGNOSIS — E079 Disorder of thyroid, unspecified: Secondary | ICD-10-CM

## 2012-03-26 DIAGNOSIS — Z124 Encounter for screening for malignant neoplasm of cervix: Secondary | ICD-10-CM | POA: Insufficient documentation

## 2012-03-26 DIAGNOSIS — R569 Unspecified convulsions: Secondary | ICD-10-CM

## 2012-03-26 DIAGNOSIS — I10 Essential (primary) hypertension: Secondary | ICD-10-CM

## 2012-03-26 LAB — CBC
HCT: 28.4 % — ABNORMAL LOW (ref 36.0–46.0)
MCHC: 33.1 g/dL (ref 30.0–36.0)
MCV: 102.5 fL — ABNORMAL HIGH (ref 78.0–100.0)
RDW: 13.7 % (ref 11.5–15.5)

## 2012-03-26 LAB — LIPID PANEL
HDL: 30 mg/dL — ABNORMAL LOW (ref >39)
LDL Cholesterol: 20 mg/dL (ref 0–99)
Total CHOL/HDL Ratio: 3.7 Ratio
Triglycerides: 301 mg/dL — ABNORMAL HIGH (ref <150)
VLDL: 60 mg/dL — ABNORMAL HIGH (ref 0–40)

## 2012-03-26 LAB — IRON AND TIBC
%SAT: 90 % — ABNORMAL HIGH (ref 20–55)
TIBC: 160 ug/dL — ABNORMAL LOW (ref 250–470)

## 2012-03-26 MED ORDER — GABAPENTIN 300 MG PO CAPS: 600.0000 mg | ORAL_CAPSULE | Freq: Three times a day (TID) | ORAL | Status: DC

## 2012-03-26 MED ORDER — CYANOCOBALAMIN 1000 MCG/ML IJ SOLN
1000.0000 ug | INTRAMUSCULAR | Status: DC
  Administered 2012-03-26: 1000 ug via INTRAMUSCULAR

## 2012-03-26 MED ORDER — DICLOFENAC SODIUM 1 % TD GEL: 2.0000 g | Freq: Four times a day (QID) | TRANSDERMAL | Status: DC | PRN

## 2012-03-26 NOTE — Patient Instructions (Addendum)
Hi Ms Wayson, together we performed a pap smear today. You have had those before so you might be aware of what happens next. If the lab finds anything abnormal on your smear, the clinic will give you a call, and there might be further tests needed. We also talked about getting some tests for your thyroid gland, as I told you that I have observed some abnormal values for your thyroid hormone levels in the past. I also want to send you for a thyroid ultrasound because on examination, I feel that your thyroid might have nodules. If the ultrasound detects any nodules, we will meet again and do more work up.  I have checked Vitamin D levels, and B12 levels for you. They have been drawn today and we will inform you about them. You received a B12 shot today but if your levels come out to be high enough then you might not need a shot in future.  Your mammogram is coming up next month and you are aware of that. We will follow up with that.   I have given you refill on Volaren 1% gel. I will increase your gabapentin to 600 mg three times a day. However, I would have to consider very hard to be able to prescribe you Oxycodone, which you ask for because you have broken our pain contract in the past.    You next visit will be after the thyroid ultrasound is done, or if any of the tests results are abnormal demanding a visit, we will call you.

## 2012-03-26 NOTE — Telephone Encounter (Signed)
Called by [employer] with a critical result.  Calcium of 6.4 with an albumin of 2.6.  Corrected Calcium is 7.5 which is borderline low.  I will forward this to Dr. Dalphine Handing for her attention.

## 2012-03-26 NOTE — Progress Notes (Signed)
Subjective:    [name], female    DOB: 05-14-1956, 56 y.o.   MRN: [id number]  HPI: [name] [name] is a 56 year old female who is a regular patient at the clinic, however, I am meeting her for the first time. 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 She has been a cocaine user in the past but informs me that she is clean for the past 1.5 years.   She comes in today for a routine check up :she wants to get a pap smear, her monthly vitamin B12 injection, and refills on Voltaren gel which she is using for back pain. She also mentions that she would like to up her dose of gabapentin as she is already using two 300mg  pills instead of one as advised. In between our discussion about her back pain, she also says that she would like to get oxycodone even though she broke the clinic's pain contract in the past.  She has no other complaints. On asking she says, she has been losing weight. I specifically asked her if she has ever been told about thyroid problems and she refuses. She denies palpitations, excessive sweating, shortness of breath, muscle aches anywhere besides her pre-existing low back pain. She says she is compliant with her medications and does not want any refills on any others.   Regarding the pap smear, I asked her if she was worried about STD and she denied that. She is having no vaginal discharge at this time.      Review of Systems:  Positive for weight loss and low back pain.  Negative as per HPI.     Objective:   Physical Exam  Constitutional: She is oriented to person, place, and time. She appears cachectic. She is cooperative.  HENT:  Head: Normocephalic.  Mouth/Throat: Oropharynx is clear and moist.  Eyes:       Eyes appear bulging, edematous lids.   Neck: Mass present.       No thyromegaly obvious  on inspection.  Nodular mass(es) palpated.   Cardiovascular: Regular rhythm.  Tachycardia present.   No murmur heard. Pulmonary/Chest: Effort normal and breath sounds normal.  Abdominal: Soft. Bowel sounds are normal.  Genitourinary: Vagina normal. No vaginal discharge found.  Neurological: She is alert and oriented to person, place, and time. She has normal reflexes.  Psychiatric: She has a normal mood and affect. Her behavior is normal.          Assessment & Plan:   1. THYROID DYSFUNCTION: I suspect hyperthyroidism of unspecified etiology so far, given her weight loss, tachycardia, her thyroid gland exam today. However, her lab results from the past have a low TSH which correlates, and a low Free T4 which is confusing with these symptoms. I would wait for the results from today when I have ordered TSH, Free T4 and Total T3 at the same time. I have also ordered a thyroid ultrasound, and would follow up on that.   2. PREVENTIVE MEASURES: Pap smear was done with no problems. Specimens for Gonorrohea and Chlamydia were not collected because the patient did not have any symptoms. She is sue for a mammogram next month and is aware of it. Colonoscopy was done this year, and Flu shot has been administered in September.   3. HYPERTENSION: Mostly under control. I will repeat electrolytes, BUN  Creatinine, liver panel, lipid profile today.   4. MACROCYTIC ANEMIA: The patient is taking vitamin B12 shots every month. I reviewed her previous B12 levels and they seem to be high. I will give the B12 shot today, check a B12 (and serum folate) level again today and review the need for B12 shots in the next visit. Her macrocytosis does not seem to be from B12 deficiency to me as that stands corrected (09/06/11: B12 - 1160 pg/ml; Folate >20).   5. IRON DEFICIENCY: Labs from 09/06/11 indicate derranged Iron panel, but macrocytosis. However, the patient is not on iron tablets at this time. I have drawn labs for  Serum Iron, Ferritin and TIBC today. I will address this issue in the next visit when I have lab results.  6. VITAMIN D DEFICIENCY AND HYPOCALCEMIA:   The most recent values of Vitamin D (25 hydroxy) dates back to 06/28/11 and is <10. She is on supplementation 50000 units every Thursday (order dates back to 6/713) and she says she has been taking it. I would like to see where her levels are at this point and review her medication. She has had significant hypocalcemia in the past and it might be related. This said, next visit I would also like to see if she wants to undergo a DEXA SCAN as she might be osteopenic/porotic given her medical history, underweight and polysubstance abuse.    7. PAIN MEDICATIONS: I increased her gabapentin to 600 mg three times a day. I also gave her another prescription for her Voltaren gel. However, I refuse to restart her on oxycodone for a couple of reasons. First, I don't know this patient well. Second, she has already broken the pain contract with this clinic before. Third, she told me that she has been clean for 1.5 years but I check in the previous encounters that on 4/213 she has been reported to be admitted in the hospital with alcohol and benzodiazepine withdrawal. Lastly, I do not have any documentation that tells me the severity of her pain. I think she is a high risk person for a pain medication contract.   8. SEIZURE DISORDER: No seizures reported. Will keep current treatment.   9: FOLLOW UP: I would like to call her in when I have her results and thyroid ultrasound results.

## 2012-03-27 LAB — COMPLETE METABOLIC PANEL WITH GFR
ALT: 13 U/L (ref 0–35)
AST: 47 U/L — ABNORMAL HIGH (ref 0–37)
Calcium: 6.4 mg/dL — CL (ref 8.4–10.5)
Chloride: 108 mEq/L (ref 96–112)
Creat: 0.78 mg/dL (ref 0.50–1.10)
Total Bilirubin: 0.4 mg/dL (ref 0.3–1.2)

## 2012-03-27 NOTE — Progress Notes (Signed)
Quick Note:  Calcium (6.4) corrected for albumin (2.6) is 7.5. Patient is undernourished. ______

## 2012-03-28 NOTE — Telephone Encounter (Signed)
Already seen this lab. Patient keeps low levels of calcium probably secondary to Vitamin D Deficiency and undernourished status. I am following the patient for the same. Thank you for this information.

## 2012-03-28 NOTE — Addendum Note (Signed)
Addended by: Aletta Edouard on: 03/28/2012 03:49 PM   Modules accepted: Orders

## 2012-03-28 NOTE — Progress Notes (Addendum)
In reference to the lab resuls of [name] [name] that I received on 03/26/12:   Hypocalcemia: The calcium corrected for albumin is 7.5, which is still lower than normal, however the patient has kept worse levels before. I suspect this is due to her chronic Vitamin D deficiency. I would like to follow this with:  - Ionized Calcium levels - Serum PTH levels At the time of the visit, [name] Gallion did not exhibit any symptoms of hypocalcemia.   Thyroid Dysfunction: Her Free T4 levels are low, which given her exam findings doesn't add up to me.   I would also like to refer her to Endocrinology for further assessment and follow up. I tried to call her on 10/22 at [phone] but could not reach her. I called the patient again today at the same number but someone else answered the phone and I left a message for her to call the clinic back at her earliest convenience.   If she calls back, I would please like to know -   1. If she is having any symptoms/signs of hypocalcemia: muscle contractions, twitching, spasms, numbness of hands and feet, seizures, dizziness, near fainting for the severe ones; and fatigue, anxiety, depression, sweating, colic for the milder ones.  2. If she is okay with visiting endocrinology, and would she be able to come in for repeat blood draw of ionized calcium, and serum PTH.    Thanks [employer]

## 2012-03-29 ENCOUNTER — Telehealth: Payer: Self-pay | Admitting: *Deleted

## 2012-03-29 NOTE — Telephone Encounter (Signed)
Pt contacted with lab results. Pt also informed to return to clinic for additional labs (which she will do on Monday 10/28).  Pt also made aware of endocrinology referral that was placed by [facility].Criss Alvine, Darlene Cassady10/25/20134:54 PM

## 2012-04-01 ENCOUNTER — Ambulatory Visit
Admission: RE | Admit: 2012-04-01 | Discharge: 2012-04-01 | Disposition: A | Discharge status: Home / Self Care | Payer: PRIVATE HEALTH INSURANCE | Source: Ambulatory Visit | Attending: Internal Medicine | Admitting: Internal Medicine

## 2012-04-01 DIAGNOSIS — Z1231 Encounter for screening mammogram for malignant neoplasm of breast: Secondary | ICD-10-CM

## 2012-04-03 ENCOUNTER — Other Ambulatory Visit (INDEPENDENT_AMBULATORY_CARE_PROVIDER_SITE_OTHER): Payer: PRIVATE HEALTH INSURANCE

## 2012-04-03 DIAGNOSIS — E559 Vitamin D deficiency, unspecified: Secondary | ICD-10-CM

## 2012-04-03 LAB — CALCIUM, IONIZED: Calcium, Ion: 0.89 mmol/L — ABNORMAL LOW (ref 1.12–1.32)

## 2012-04-04 LAB — PARATHYROID HORMONE, INTACT (NO CA): PTH: 61.6 pg/mL (ref 14.0–72.0)

## 2012-04-22 ENCOUNTER — Ambulatory Visit (HOSPITAL_COMMUNITY): Payer: PRIVATE HEALTH INSURANCE

## 2012-05-06 ENCOUNTER — Emergency Department (HOSPITAL_COMMUNITY): Payer: PRIVATE HEALTH INSURANCE

## 2012-05-06 ENCOUNTER — Emergency Department (HOSPITAL_COMMUNITY)
Admission: EM | Admit: 2012-05-06 | Discharge: 2012-05-06 | Disposition: A | Discharge status: Home / Self Care | Payer: PRIVATE HEALTH INSURANCE | Attending: Emergency Medicine | Admitting: Emergency Medicine

## 2012-05-06 ENCOUNTER — Encounter (HOSPITAL_COMMUNITY): Payer: Self-pay | Admitting: Emergency Medicine

## 2012-05-06 DIAGNOSIS — G40909 Epilepsy, unspecified, not intractable, without status epilepticus: Secondary | ICD-10-CM | POA: Insufficient documentation

## 2012-05-06 DIAGNOSIS — Y939 Activity, unspecified: Secondary | ICD-10-CM | POA: Insufficient documentation

## 2012-05-06 DIAGNOSIS — Z8659 Personal history of other mental and behavioral disorders: Secondary | ICD-10-CM | POA: Insufficient documentation

## 2012-05-06 DIAGNOSIS — Z79899 Other long term (current) drug therapy: Secondary | ICD-10-CM | POA: Insufficient documentation

## 2012-05-06 DIAGNOSIS — Z8669 Personal history of other diseases of the nervous system and sense organs: Secondary | ICD-10-CM | POA: Insufficient documentation

## 2012-05-06 DIAGNOSIS — Z8739 Personal history of other diseases of the musculoskeletal system and connective tissue: Secondary | ICD-10-CM | POA: Insufficient documentation

## 2012-05-06 DIAGNOSIS — E785 Hyperlipidemia, unspecified: Secondary | ICD-10-CM | POA: Insufficient documentation

## 2012-05-06 DIAGNOSIS — Z8719 Personal history of other diseases of the digestive system: Secondary | ICD-10-CM | POA: Insufficient documentation

## 2012-05-06 DIAGNOSIS — F1010 Alcohol abuse, uncomplicated: Secondary | ICD-10-CM | POA: Insufficient documentation

## 2012-05-06 DIAGNOSIS — S99929A Unspecified injury of unspecified foot, initial encounter: Secondary | ICD-10-CM | POA: Insufficient documentation

## 2012-05-06 DIAGNOSIS — Z8781 Personal history of (healed) traumatic fracture: Secondary | ICD-10-CM | POA: Insufficient documentation

## 2012-05-06 DIAGNOSIS — W010XXA Fall on same level from slipping, tripping and stumbling without subsequent striking against object, initial encounter: Secondary | ICD-10-CM | POA: Insufficient documentation

## 2012-05-06 DIAGNOSIS — K219 Gastro-esophageal reflux disease without esophagitis: Secondary | ICD-10-CM | POA: Insufficient documentation

## 2012-05-06 DIAGNOSIS — Z9889 Other specified postprocedural states: Secondary | ICD-10-CM | POA: Insufficient documentation

## 2012-05-06 DIAGNOSIS — Y929 Unspecified place or not applicable: Secondary | ICD-10-CM | POA: Insufficient documentation

## 2012-05-06 DIAGNOSIS — Z87891 Personal history of nicotine dependence: Secondary | ICD-10-CM | POA: Insufficient documentation

## 2012-05-06 DIAGNOSIS — Z8742 Personal history of other diseases of the female genital tract: Secondary | ICD-10-CM | POA: Insufficient documentation

## 2012-05-06 DIAGNOSIS — S8990XA Unspecified injury of unspecified lower leg, initial encounter: Secondary | ICD-10-CM | POA: Insufficient documentation

## 2012-05-06 DIAGNOSIS — D649 Anemia, unspecified: Secondary | ICD-10-CM | POA: Insufficient documentation

## 2012-05-06 DIAGNOSIS — D518 Other vitamin B12 deficiency anemias: Secondary | ICD-10-CM | POA: Insufficient documentation

## 2012-05-06 DIAGNOSIS — E781 Pure hyperglyceridemia: Secondary | ICD-10-CM | POA: Insufficient documentation

## 2012-05-06 DIAGNOSIS — Z862 Personal history of diseases of the blood and blood-forming organs and certain disorders involving the immune mechanism: Secondary | ICD-10-CM | POA: Insufficient documentation

## 2012-05-06 DIAGNOSIS — I10 Essential (primary) hypertension: Secondary | ICD-10-CM | POA: Insufficient documentation

## 2012-05-06 DIAGNOSIS — G4700 Insomnia, unspecified: Secondary | ICD-10-CM | POA: Insufficient documentation

## 2012-05-06 DIAGNOSIS — R160 Hepatomegaly, not elsewhere classified: Secondary | ICD-10-CM | POA: Insufficient documentation

## 2012-05-06 DIAGNOSIS — Z87820 Personal history of traumatic brain injury: Secondary | ICD-10-CM | POA: Insufficient documentation

## 2012-05-06 NOTE — ED Provider Notes (Signed)
History    This chart was scribed for [name] Quarry, MD, MD by Smitty Pluck, ED Scribe. The patient was seen in room TR08C and the patient's care was started at 8:48AM.   CSN: [id number]  Arrival date & time 05/06/12  3244   Chief Complaint  Patient presents with  . Knee Pain    (Consider location/radiation/quality/duration/timing/severity/associated sxs/prior treatment) The history is provided by the patient.   ROSYLN [name] is a 56 y.o. female who presents to the Emergency Department complaining of constant, moderate left knee pain onset 1 day ago due to falling. Pt reports that she tripped over grandchild's toys. She reports that she bearing weight aggravates the knee pain. She has taken tylenol without relief. Denies hip pain, head injury, LOC and any other pain.     Past Medical History  Diagnosis Date  . Anemia, B12 deficiency   . History of acute pancreatitis   . Right knee pain     No recent imaging on chart  . 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 Dr. Su Hilt, [facility]  . Hypertriglyceridemia   . GERD (gastroesophageal reflux disease)   . Vitamin D deficiency   . Subdural hematoma 02/2008    Likely 2/2 trauma from seizure from EtOH withdrawal, chronic in nature, sees Dr. Robyne Askew. Most recent CT head 10/2009 showing stable but persistent hematoma without mass effect.  . History of seizure disorder     Likely 2/2 alcohol abuse  . Hypocalcemia   . Hypomagnesemia   . Failure to thrive in childhood     Unclear etiology  . HTN (hypertension)   . Thrombocytopenia   . Anemia, macrocytic   . Hepatomegaly     On exam  . Alcohol abuse   . Joint pain   . Alcohol abuse   . Arthritis   . Vitamin D deficiency   . Menopause   . Pancreatitis   . Insomnia   . Hyperlipidemia   . Sinusitis   . Pernicious anemia   . Subdural hematoma   . Macrocytic anemia   . Hepatomegaly   . Tuberculosis     AS  CHILD MED TX  . Seizures     1.5 YRS  LAST ONE  . Depression   . Hepatitis     HEPATOMEGALY   . Fx humeral neck 04/17/2011    Transverse fracture- minimally displaced- managed as outpatient   . ABNORMAL PAP SMEAR, LGSIL 07/23/2008    Annotation: HPV positive CIN I Dr. Su Hilt, [facility] Qualifier: Diagnosis of  By: Danae Chen      Past Surgical History  Procedure Date  . Cesarean section 1983  . Esophagogastroduodenoscopy 07/11/2011    Procedure: ESOPHAGOGASTRODUODENOSCOPY (EGD);  Surgeon: Theda Belfast, MD;  Location: [facility];  Service: Endoscopy;  Laterality: N/A;  . Colonoscopy 07/11/2011    Procedure: COLONOSCOPY;  Surgeon: Theda Belfast, MD;  Location: [facility] ENDOSCOPY;  Service: Endoscopy;  Laterality: N/A;  . Eye surgery     LEFT EYE YRS AGO   . Rt colectomy 08/28/2011    Family History  Problem Relation Age of Onset  . Cancer Mother     Died from stomach cancer and "flesh eating rash  . Heart failure Father     Died in 99s from an MI  . Alcohol abuse Sister     Twin sister drinks a lot, as did both her parents and  brothers  . Stroke Brother     Has 7 brothers, 1 with CVA    History  Substance Use Topics  . Smoking status: Former Smoker    Types: Cigarettes    Quit date: 09/20/2010  . Smokeless tobacco: Never Used  . Alcohol Use: Yes     Comment: drinks a 5th per day    OB History    Grav Para Term Preterm Abortions TAB SAB Ect Mult Living                  Review of Systems  Constitutional: Negative for fever and chills.  Respiratory: Negative for shortness of breath.   Gastrointestinal: Negative for nausea and vomiting.  Musculoskeletal: Positive for arthralgias.  Neurological: Negative for syncope and weakness.  All other systems reviewed and are negative.    Allergies  Amitriptyline hcl and Doxycycline hyclate  Home Medications   Current Outpatient Rx  Name  Route  Sig  Dispense  Refill  . AMLODIPINE BESYLATE 10  MG PO TABS   Oral   Take 10 mg by mouth daily.         . AMYLASE-LIPASE-PROTEASE 20-4.5-25 MU PO CPEP   Oral   Take by mouth 3 (three) times daily with meals.          Marland Kitchen CALCIUM GLUCONATE 500 MG PO TABS   Oral   Take 500 mg by mouth daily.         . CYANOCOBALAMIN 1000 MCG/ML IJ SOLN   Intramuscular   Inject 1,000 mcg into the muscle every 30 (thirty) days. Last day of month. Last dose last month         . DICLOFENAC SODIUM 1 % TD GEL   Topical   Apply 2 g topically 4 (four) times daily as needed.   100 g   0   . FLUOXETINE HCL 10 MG PO CAPS   Oral   Take 10 mg by mouth daily.          Marland Kitchen FOLIC ACID 1 MG PO TABS   Oral   Take 1 tablet (1 mg total) by mouth daily.   30 tablet   11   . GABAPENTIN 300 MG PO CAPS   Oral   Take 2 capsules (600 mg total) by mouth 3 (three) times daily.   90 capsule   2   . HYDROCHLOROTHIAZIDE 25 MG PO TABS   Oral   Take 25 mg by mouth daily.          . IBUPROFEN 100 MG PO TABS   Oral   Take 200 mg by mouth 2 (two) times daily as needed. For pain.         Marland Kitchen LEVETIRACETAM 500 MG PO TABS   Oral   Take 500 mg by mouth 2 (two) times daily.         Marland Kitchen MAGNESIUM OXIDE 400 MG PO TABS   Oral   Take 400 mg by mouth 2 (two) times daily.         . ADULT MULTIVITAMIN W/MINERALS CH   Oral   Take 1 tablet by mouth daily.   30 tablet   11   . VITAMIN B-1 100 MG PO TABS   Oral   Take 100 mg by mouth 2 (two) times daily.          Marland Kitchen VITAMIN D (ERGOCALCIFEROL) 50000 UNITS PO CAPS   Oral   Take 50,000 Units by mouth every 7 (seven)  days. Take on Thursdays.           BP 151/98  Pulse 116  Temp 98.2 F (36.8 C) (Oral)  Resp 16  SpO2 97%  Physical Exam  Nursing note and vitals reviewed. Constitutional: She is oriented to person, place, and time. She appears well-developed and well-nourished. No distress.  HENT:  Head: Normocephalic and atraumatic.  Eyes: EOM are normal.  Neck: Neck supple. No tracheal deviation  present.  Cardiovascular: Normal rate.   Pulmonary/Chest: Effort normal. No respiratory distress.  Musculoskeletal: Normal range of motion.       Warmth in left knee in anterior aspect Diffuse tenderness Left knee effusion  No crepitus No bony tenderness Pulses are good Left ankle is nl  Hip is stable with full rom no tenderness   Neurological: She is alert and oriented to person, place, and time.  Skin: Skin is warm and dry.  Psychiatric: She has a normal mood and affect. Her behavior is normal.    ED Course  Procedures (including critical care time) DIAGNOSTIC STUDIES: Oxygen Saturation is 97% on room air, normal by my interpretation.    COORDINATION OF CARE: 8:52 AM Discussed ED treatment with pt     Labs Reviewed - No data to display Dg Knee Complete 4 Views Left  05/06/2012  *RADIOLOGY REPORT*  Clinical Data: Fall yesterday, left knee pain  LEFT KNEE - COMPLETE 4+ VIEW  Comparison: 01/27/2009  Findings: Four views of the left knee submitted.  No acute fracture or subluxation.  There is diffuse osteopenia.  Small joint effusion.  Mild narrowing of the medial joint compartment.  Minimal spurring of the lateral femoral condyle.  Narrowing of patellofemoral joint space.  IMPRESSION: No acute fracture or subluxation.  Diffuse osteopenia.  Mild degenerative changes as described above.  Small joint effusion.   Original Report Authenticated By: Natasha Mead, M.D.      No diagnosis found.    MDM  I personally performed the services described in this documentation, which was scribed in my presence. The recorded information has been reviewed and considered.      Patient fell yesterday and has pain to left knee.  Full arom, neurovascularly intact distal to injury, no other injuries.  Patient with mild effusion.  Plan knee immobilizer, crutches, follow up with ortho.    [name] Quarry, MD 05/06/12 [phone]

## 2012-05-06 NOTE — ED Notes (Signed)
Ortho tech notified.  

## 2012-05-06 NOTE — ED Notes (Signed)
Tripped and fell last pm injuring left knee. Denies other complaints.

## 2012-05-06 NOTE — ED Provider Notes (Cosign Needed)
i was not directly involved in pt care.   Fayrene Helper, PA-C 05/06/12 [phone]

## 2012-05-06 NOTE — Progress Notes (Signed)
Orthopedic Tech Progress Note Patient Details:  [name] [name] 02/26/1956 [id number]  Ortho Devices Type of Ortho Device: Knee Immobilizer;Crutches Ortho Device/Splint Location: RIGHT KNEE IMMOBILIZER WITH CRUTCHES Ortho Device/Splint Interventions: Application   Shawnie Pons 05/06/2012, 10:11 AM

## 2012-05-06 NOTE — ED Notes (Signed)
Pt ambulated to W/C without crutches.

## 2012-05-20 ENCOUNTER — Inpatient Hospital Stay (HOSPITAL_COMMUNITY)
Admission: EM | Admit: 2012-05-20 | Discharge: 2012-05-23 | DRG: 871 | Disposition: A | Discharge status: Home / Self Care | Payer: PRIVATE HEALTH INSURANCE | Attending: Internal Medicine | Admitting: Internal Medicine

## 2012-05-20 ENCOUNTER — Encounter (HOSPITAL_COMMUNITY): Payer: Self-pay | Admitting: *Deleted

## 2012-05-20 ENCOUNTER — Emergency Department (HOSPITAL_COMMUNITY): Payer: PRIVATE HEALTH INSURANCE

## 2012-05-20 DIAGNOSIS — Z87891 Personal history of nicotine dependence: Secondary | ICD-10-CM

## 2012-05-20 DIAGNOSIS — G40909 Epilepsy, unspecified, not intractable, without status epilepticus: Secondary | ICD-10-CM | POA: Diagnosis present

## 2012-05-20 DIAGNOSIS — I10 Essential (primary) hypertension: Secondary | ICD-10-CM | POA: Diagnosis present

## 2012-05-20 DIAGNOSIS — M129 Arthropathy, unspecified: Secondary | ICD-10-CM | POA: Diagnosis present

## 2012-05-20 DIAGNOSIS — J13 Pneumonia due to Streptococcus pneumoniae: Secondary | ICD-10-CM | POA: Diagnosis present

## 2012-05-20 DIAGNOSIS — F1010 Alcohol abuse, uncomplicated: Secondary | ICD-10-CM | POA: Diagnosis present

## 2012-05-20 DIAGNOSIS — R000 Tachycardia, unspecified: Secondary | ICD-10-CM | POA: Diagnosis present

## 2012-05-20 DIAGNOSIS — N2581 Secondary hyperparathyroidism of renal origin: Secondary | ICD-10-CM | POA: Diagnosis present

## 2012-05-20 DIAGNOSIS — J154 Pneumonia due to other streptococci: Secondary | ICD-10-CM | POA: Diagnosis present

## 2012-05-20 DIAGNOSIS — E538 Deficiency of other specified B group vitamins: Secondary | ICD-10-CM | POA: Diagnosis present

## 2012-05-20 DIAGNOSIS — F10239 Alcohol dependence with withdrawal, unspecified: Secondary | ICD-10-CM | POA: Diagnosis present

## 2012-05-20 DIAGNOSIS — E785 Hyperlipidemia, unspecified: Secondary | ICD-10-CM | POA: Diagnosis present

## 2012-05-20 DIAGNOSIS — E860 Dehydration: Secondary | ICD-10-CM

## 2012-05-20 DIAGNOSIS — E46 Unspecified protein-calorie malnutrition: Secondary | ICD-10-CM | POA: Diagnosis present

## 2012-05-20 DIAGNOSIS — J189 Pneumonia, unspecified organism: Secondary | ICD-10-CM

## 2012-05-20 DIAGNOSIS — F10939 Alcohol use, unspecified with withdrawal, unspecified: Secondary | ICD-10-CM | POA: Diagnosis present

## 2012-05-20 DIAGNOSIS — F329 Major depressive disorder, single episode, unspecified: Secondary | ICD-10-CM | POA: Diagnosis present

## 2012-05-20 DIAGNOSIS — D649 Anemia, unspecified: Secondary | ICD-10-CM | POA: Diagnosis present

## 2012-05-20 DIAGNOSIS — D696 Thrombocytopenia, unspecified: Secondary | ICD-10-CM | POA: Diagnosis present

## 2012-05-20 DIAGNOSIS — Z79899 Other long term (current) drug therapy: Secondary | ICD-10-CM

## 2012-05-20 DIAGNOSIS — Z681 Body mass index (BMI) 19 or less, adult: Secondary | ICD-10-CM

## 2012-05-20 DIAGNOSIS — E876 Hypokalemia: Secondary | ICD-10-CM | POA: Diagnosis present

## 2012-05-20 DIAGNOSIS — F3289 Other specified depressive episodes: Secondary | ICD-10-CM | POA: Diagnosis present

## 2012-05-20 DIAGNOSIS — K219 Gastro-esophageal reflux disease without esophagitis: Secondary | ICD-10-CM | POA: Diagnosis present

## 2012-05-20 DIAGNOSIS — A403 Sepsis due to Streptococcus pneumoniae: Principal | ICD-10-CM | POA: Diagnosis present

## 2012-05-20 DIAGNOSIS — A419 Sepsis, unspecified organism: Secondary | ICD-10-CM | POA: Diagnosis present

## 2012-05-20 DIAGNOSIS — E781 Pure hyperglyceridemia: Secondary | ICD-10-CM | POA: Diagnosis present

## 2012-05-20 DIAGNOSIS — R627 Adult failure to thrive: Secondary | ICD-10-CM | POA: Diagnosis present

## 2012-05-20 DIAGNOSIS — E878 Other disorders of electrolyte and fluid balance, not elsewhere classified: Secondary | ICD-10-CM | POA: Diagnosis present

## 2012-05-20 DIAGNOSIS — N179 Acute kidney failure, unspecified: Secondary | ICD-10-CM

## 2012-05-20 DIAGNOSIS — Z8701 Personal history of pneumonia (recurrent): Secondary | ICD-10-CM

## 2012-05-20 HISTORY — DX: Pneumonia, unspecified organism: J18.9

## 2012-05-20 LAB — BASIC METABOLIC PANEL
BUN: 13 mg/dL (ref 6–23)
Calcium: 4.5 mg/dL — CL (ref 8.4–10.5)
Creatinine, Ser: 0.98 mg/dL (ref 0.50–1.10)
GFR calc Af Amer: 74 mL/min — ABNORMAL LOW (ref >90)
GFR calc non Af Amer: 64 mL/min — ABNORMAL LOW (ref >90)

## 2012-05-20 LAB — CBC WITH DIFFERENTIAL/PLATELET
Basophils Absolute: 0 10*3/uL (ref 0.0–0.1)
Basophils Relative: 0 % (ref 0–1)
Eosinophils Absolute: 0 10*3/uL (ref 0.0–0.7)
Hemoglobin: 8.8 g/dL — ABNORMAL LOW (ref 12.0–15.0)
Lymphocytes Relative: 15 % (ref 12–46)
MCH: 38.8 pg — ABNORMAL HIGH (ref 26.0–34.0)
MCHC: 36.8 g/dL — ABNORMAL HIGH (ref 30.0–36.0)
Monocytes Absolute: 2.8 10*3/uL — ABNORMAL HIGH (ref 0.1–1.0)
Neutrophils Relative %: 65 % (ref 43–77)
Platelets: 136 10*3/uL — ABNORMAL LOW (ref 150–400)
RDW: 13.8 % (ref 11.5–15.5)

## 2012-05-20 LAB — COMPREHENSIVE METABOLIC PANEL
AST: 35 U/L (ref 0–37)
Albumin: 1.7 g/dL — ABNORMAL LOW (ref 3.5–5.2)
Alkaline Phosphatase: 197 U/L — ABNORMAL HIGH (ref 39–117)
BUN: 16 mg/dL (ref 6–23)
Chloride: 98 mEq/L (ref 96–112)
Potassium: 3.1 mEq/L — ABNORMAL LOW (ref 3.5–5.1)
Total Bilirubin: 0.4 mg/dL (ref 0.3–1.2)
Total Protein: 8 g/dL (ref 6.0–8.3)

## 2012-05-20 LAB — HIV ANTIBODY (ROUTINE TESTING W REFLEX): HIV: NONREACTIVE

## 2012-05-20 LAB — MAGNESIUM: Magnesium: 0.5 mg/dL — CL (ref 1.5–2.5)

## 2012-05-20 LAB — POCT I-STAT, CHEM 8
Creatinine, Ser: 1.4 mg/dL — ABNORMAL HIGH (ref 0.50–1.10)
HCT: 24 % — ABNORMAL LOW (ref 36.0–46.0)
Hemoglobin: 8.2 g/dL — ABNORMAL LOW (ref 12.0–15.0)
Potassium: 2.9 mEq/L — ABNORMAL LOW (ref 3.5–5.1)
Sodium: 138 mEq/L (ref 135–145)
TCO2: 16 mmol/L (ref 0–100)

## 2012-05-20 MED ORDER — SODIUM CHLORIDE 0.9 % IV SOLN
INTRAVENOUS | Status: DC
  Administered 2012-05-20 – 2012-05-22 (×2): via INTRAVENOUS

## 2012-05-20 MED ORDER — LORAZEPAM 2 MG/ML IJ SOLN: 1.0000 mg | Freq: Four times a day (QID) | INTRAMUSCULAR | Status: DC | PRN

## 2012-05-20 MED ORDER — ONDANSETRON HCL 4 MG/2ML IJ SOLN: 4.0000 mg | Freq: Three times a day (TID) | INTRAMUSCULAR | Status: AC | PRN

## 2012-05-20 MED ORDER — POTASSIUM CHLORIDE 10 MEQ/100ML IV SOLN
10.0000 meq | INTRAVENOUS | Status: AC
  Administered 2012-05-20 (×2): 10 meq via INTRAVENOUS
  Filled 2012-05-20 (×2): qty 100

## 2012-05-20 MED ORDER — DEXTROSE 5 % IV SOLN
500.0000 mg | Freq: Once | INTRAVENOUS | Status: AC
  Administered 2012-05-20: 500 mg via INTRAVENOUS
  Filled 2012-05-20: qty 500

## 2012-05-20 MED ORDER — ALBUTEROL SULFATE (5 MG/ML) 0.5% IN NEBU
5.0000 mg | INHALATION_SOLUTION | Freq: Once | RESPIRATORY_TRACT | Status: AC
  Administered 2012-05-20: 5 mg via RESPIRATORY_TRACT
  Filled 2012-05-20: qty 1

## 2012-05-20 MED ORDER — MAGNESIUM OXIDE 400 MG PO TABS
400.0000 mg | ORAL_TABLET | Freq: Two times a day (BID) | ORAL | Status: DC
  Administered 2012-05-20 – 2012-05-23 (×7): 400 mg via ORAL
  Filled 2012-05-20 (×10): qty 1

## 2012-05-20 MED ORDER — AZITHROMYCIN 500 MG PO TABS
500.0000 mg | ORAL_TABLET | ORAL | Status: DC
  Administered 2012-05-21 – 2012-05-23 (×3): 500 mg via ORAL
  Filled 2012-05-20 (×3): qty 1

## 2012-05-20 MED ORDER — SODIUM CHLORIDE 0.9 % IV SOLN
1.0000 g | Freq: Once | INTRAVENOUS | Status: AC
  Administered 2012-05-21: 1 g via INTRAVENOUS
  Filled 2012-05-20: qty 10

## 2012-05-20 MED ORDER — GABAPENTIN 300 MG PO CAPS
600.0000 mg | ORAL_CAPSULE | Freq: Three times a day (TID) | ORAL | Status: DC
  Administered 2012-05-20 – 2012-05-23 (×9): 600 mg via ORAL
  Filled 2012-05-20 (×13): qty 2

## 2012-05-20 MED ORDER — PANCRELIPASE (LIP-PROT-AMYL) 12000-38000 UNITS PO CPEP
1.0000 | ORAL_CAPSULE | Freq: Three times a day (TID) | ORAL | Status: DC
  Administered 2012-05-20 – 2012-05-23 (×8): 1 via ORAL
  Filled 2012-05-20 (×13): qty 1

## 2012-05-20 MED ORDER — DEXTROSE 5 % IV SOLN
1.0000 g | INTRAVENOUS | Status: DC
  Administered 2012-05-21 – 2012-05-23 (×3): 1 g via INTRAVENOUS
  Filled 2012-05-20 (×3): qty 10

## 2012-05-20 MED ORDER — ENOXAPARIN SODIUM 30 MG/0.3ML ~~LOC~~ SOLN
30.0000 mg | SUBCUTANEOUS | Status: DC
  Administered 2012-05-21 – 2012-05-22 (×3): 30 mg via SUBCUTANEOUS
  Filled 2012-05-20 (×5): qty 0.3

## 2012-05-20 MED ORDER — DEXTROSE 5 % IV SOLN
1.0000 g | Freq: Once | INTRAVENOUS | Status: AC
  Administered 2012-05-20: 1 g via INTRAVENOUS
  Filled 2012-05-20: qty 10

## 2012-05-20 MED ORDER — ALBUTEROL SULFATE (5 MG/ML) 0.5% IN NEBU
2.5000 mg | INHALATION_SOLUTION | RESPIRATORY_TRACT | Status: AC
  Administered 2012-05-20 – 2012-05-21 (×3): 2.5 mg via RESPIRATORY_TRACT
  Filled 2012-05-20 (×3): qty 0.5

## 2012-05-20 MED ORDER — SODIUM CHLORIDE 0.9 % IV BOLUS (SEPSIS)
1000.0000 mL | Freq: Once | INTRAVENOUS | Status: AC
  Administered 2012-05-20: 1000 mL via INTRAVENOUS

## 2012-05-20 MED ORDER — FOLIC ACID 1 MG PO TABS
1.0000 mg | ORAL_TABLET | Freq: Every day | ORAL | Status: DC
  Administered 2012-05-20 – 2012-05-23 (×4): 1 mg via ORAL
  Filled 2012-05-20 (×5): qty 1

## 2012-05-20 MED ORDER — CALCIUM GLUCONATE 500 MG PO TABS
500.0000 mg | ORAL_TABLET | Freq: Every day | ORAL | Status: DC
  Administered 2012-05-20: 500 mg via ORAL
  Filled 2012-05-20 (×2): qty 1

## 2012-05-20 MED ORDER — POTASSIUM CHLORIDE CRYS ER 20 MEQ PO TBCR
40.0000 meq | EXTENDED_RELEASE_TABLET | Freq: Once | ORAL | Status: AC
Start: 2012-05-20 — End: 2012-05-21
  Administered 2012-05-21: 40 meq via ORAL
  Filled 2012-05-20: qty 2

## 2012-05-20 MED ORDER — FLUOXETINE HCL 10 MG PO CAPS
10.0000 mg | ORAL_CAPSULE | Freq: Every day | ORAL | Status: DC
  Administered 2012-05-20 – 2012-05-23 (×4): 10 mg via ORAL
  Filled 2012-05-20 (×5): qty 1

## 2012-05-20 MED ORDER — SODIUM CHLORIDE 0.9 % IV SOLN: INTRAVENOUS | Status: DC

## 2012-05-20 MED ORDER — ADULT MULTIVITAMIN W/MINERALS CH
1.0000 | ORAL_TABLET | Freq: Every day | ORAL | Status: DC
  Administered 2012-05-20 – 2012-05-23 (×4): 1 via ORAL
  Filled 2012-05-20 (×5): qty 1

## 2012-05-20 MED ORDER — POTASSIUM CHLORIDE CRYS ER 20 MEQ PO TBCR
40.0000 meq | EXTENDED_RELEASE_TABLET | Freq: Once | ORAL | Status: AC
  Administered 2012-05-20: 40 meq via ORAL
  Filled 2012-05-20: qty 2

## 2012-05-20 MED ORDER — VITAMIN B-1 100 MG PO TABS
100.0000 mg | ORAL_TABLET | Freq: Two times a day (BID) | ORAL | Status: DC
  Administered 2012-05-20 – 2012-05-23 (×6): 100 mg via ORAL
  Filled 2012-05-20 (×8): qty 1

## 2012-05-20 MED ORDER — VITAMIN D (ERGOCALCIFEROL) 1.25 MG (50000 UNIT) PO CAPS
50000.0000 [IU] | ORAL_CAPSULE | ORAL | Status: DC
  Administered 2012-05-20: 50000 [IU] via ORAL
  Filled 2012-05-20 (×2): qty 1

## 2012-05-20 MED ORDER — LEVETIRACETAM 500 MG PO TABS
500.0000 mg | ORAL_TABLET | Freq: Two times a day (BID) | ORAL | Status: DC
Start: 2012-05-20 — End: 2012-05-23
  Administered 2012-05-21 – 2012-05-23 (×6): 500 mg via ORAL
  Filled 2012-05-20 (×8): qty 1

## 2012-05-20 MED ORDER — LORAZEPAM 1 MG PO TABS
1.0000 mg | ORAL_TABLET | Freq: Four times a day (QID) | ORAL | Status: DC | PRN
  Administered 2012-05-22: 1 mg via ORAL
  Filled 2012-05-20: qty 1

## 2012-05-20 MED ORDER — MAGNESIUM SULFATE 40 MG/ML IJ SOLN
4.0000 g | Freq: Once | INTRAMUSCULAR | Status: AC
  Administered 2012-05-20: 4 g via INTRAVENOUS
  Filled 2012-05-20: qty 100

## 2012-05-20 MED ORDER — OXYCODONE-ACETAMINOPHEN 5-325 MG PO TABS
1.0000 | ORAL_TABLET | ORAL | Status: DC | PRN
  Administered 2012-05-22 (×2): 1 via ORAL
  Filled 2012-05-20 (×2): qty 1

## 2012-05-20 NOTE — ED Notes (Signed)
JL received this patient at 11am.

## 2012-05-20 NOTE — Progress Notes (Signed)
Utilization Review Completed.K[employer]an [employer] T12/16/2013   

## 2012-05-20 NOTE — H&P (Signed)
Hospital Admission Note Date: 05/20/2012  Patient name: [name] [name] Medical record number: [id number] Date of birth: May 01, 1956 Age: 56 y.o. Gender: female PCP: Aletta Edouard, MD  Medical Service:Internal Medicine Attending physician:Dr. Bhardwaj     1st Contact: Dr. Shirlee Latch [phone] 2nd Contact: Dr. Everardo Beals [phone] After 5 pm or weekends: 1st Contact: Pager: [phone] 2nd Contact: Pager: [phone]  Chief Complaint: cough  History of Present Illness: Cold symptoms associated with runny nose, dry to productive cough (thick clear sputum), sweats, chills poor appetite x 3 weeks (only intake fluid).  CXR showed left lower lobe pneumonia.  Questionable right upper lobe nodule.  She has had a flu shot this year.  ROS: +weight loss, denies sick contacts, denies fevers, denies dysphagia, mild chest pain associated with coughing, +palpitations, denies sob, denies ab pain, denies constipation/diarrhea, denies blood in urine or stool, denies dysuria, denies swelling lower extremities, denies muscle aches, denies skin breakdown.    Meds: Medications Prior to Admission  Medication Sig Dispense Refill  . amLODipine (NORVASC) 10 MG tablet Take 10 mg by mouth daily.      Marland Kitchen amylase-lipase-protease (PANGESTYME EC) 20-4.5-25 MU per capsule Take 1 capsule by mouth 3 (three) times daily with meals.       . calcium gluconate 500 MG tablet Take 500 mg by mouth daily.      . cyanocobalamin (,VITAMIN B-12,) 1000 MCG/ML injection Inject 1,000 mcg into the muscle every 30 (thirty) days. Last day of month. Last dose last month      . diclofenac (VOLTAREN) 75 MG EC tablet Take 75 mg by mouth 2 (two) times daily.      . diclofenac sodium (VOLTAREN) 1 % GEL Apply 2 g topically 4 (four) times daily as needed.  100 g  0  . FLUoxetine (PROZAC) 10 MG capsule Take 10 mg by mouth daily.       . folic acid (FOLVITE) 1 MG tablet Take 1 tablet (1 mg total) by mouth daily.  30 tablet  11  . gabapentin  (NEURONTIN) 300 MG capsule Take 2 capsules (600 mg total) by mouth 3 (three) times daily.  90 capsule  2  . hydrochlorothiazide (HYDRODIURIL) 25 MG tablet Take 25 mg by mouth daily.       Marland Kitchen ibuprofen (ADVIL,MOTRIN) 200 MG tablet Take 200 mg by mouth every 6 (six) hours as needed. For pain      . levETIRAcetam (KEPPRA) 500 MG tablet Take 500 mg by mouth 2 (two) times daily.      . magnesium oxide (MAG-OX) 400 MG tablet Take 400 mg by mouth 2 (two) times daily.      . Multiple Vitamin (MULITIVITAMIN WITH MINERALS) TABS Take 1 tablet by mouth daily.  30 tablet  11  . oxyCODONE-acetaminophen (PERCOCET/ROXICET) 5-325 MG per tablet Take 1 tablet by mouth every 4 (four) hours as needed. For pain      . promethazine (PHENERGAN) 12.5 MG tablet Take 12.5 mg by mouth every 6 (six) hours as needed. For nausea      . thiamine (VITAMIN B-1) 100 MG tablet Take 100 mg by mouth 2 (two) times daily.       . Vitamin D, Ergocalciferol, (DRISDOL) 50000 UNITS CAPS Take 50,000 Units by mouth every 7 (seven) days. Take on Thursdays.        Allergies: Allergies as of 05/20/2012 - Review Complete 05/20/2012  Allergen Reaction Noted  . Amitriptyline hcl Swelling   . Doxycycline hyclate Itching 11/10/2010   Past Medical  History  Diagnosis Date  . Anemia, B12 deficiency   . History of acute pancreatitis   . Right knee pain     No recent imaging on chart  . 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 Dr. Su Hilt, [facility]  . Hypertriglyceridemia   . GERD (gastroesophageal reflux disease)   . Vitamin D deficiency   . Subdural hematoma 02/2008    Likely 2/2 trauma from seizure from EtOH withdrawal, chronic in nature, sees Dr. Robyne Askew. Most recent CT head 10/2009 showing stable but persistent hematoma without mass effect.  . History of seizure disorder     Likely 2/2 alcohol abuse  . Hypocalcemia   . Hypomagnesemia   . Failure to thrive in childhood      Unclear etiology  . HTN (hypertension)   . Thrombocytopenia   . Anemia, macrocytic   . Hepatomegaly     On exam  . Alcohol abuse   . Joint pain   . Alcohol abuse   . Arthritis   . Vitamin D deficiency   . Menopause   . Pancreatitis   . Insomnia   . Hyperlipidemia   . Sinusitis   . Pernicious anemia   . Subdural hematoma   . Macrocytic anemia   . Hepatomegaly   . 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 Seizures     1.5 YRS  LAST ONE  . Depression   . Hepatitis     HEPATOMEGALY   . Fx humeral neck 04/17/2011    Transverse fracture- minimally displaced- managed as outpatient   . ABNORMAL PAP SMEAR, LGSIL 07/23/2008    Annotation: HPV positive CIN I Dr. Su Hilt, [facility] Qualifier: Diagnosis of  By: Danae Chen    . Pneumonia 05/20/2012   Past Surgical History  Procedure Date  . Cesarean section 1983  . Esophagogastroduodenoscopy 07/11/2011    Procedure: ESOPHAGOGASTRODUODENOSCOPY (EGD);  Surgeon: Theda Belfast, MD;  Location: [facility];  Service: Endoscopy;  Laterality: N/A;  . Colonoscopy 07/11/2011    Procedure: COLONOSCOPY;  Surgeon: Theda Belfast, MD;  Location: [facility];  Service: Endoscopy;  Laterality: N/A;  . Eye surgery     LEFT EYE YRS AGO   . Rt colectomy 08/28/2011   Family History  Problem Relation Age of Onset  . Cancer Mother     Died from stomach cancer and "flesh eating rash  . Heart failure Father     Died in 44s from an MI  . Alcohol abuse Sister     Twin sister drinks a lot, as did both her parents and brothers  . Stroke Brother     Has 7 brothers, 1 with CVA   History   Social History  . Marital Status: Divorced    Spouse Name: N/A    Number of Children: N/A  . Years of Education: N/A   Occupational History  . Not on file.   Social History Main Topics  . Smoking status: Former Smoker    Types: Cigarettes    Quit date: 09/20/2010  . Smokeless tobacco: Never Used  . Alcohol Use: Yes     Comment:  1-2 glasses wine a day  . Drug Use: No     Comment: HX  USE   . Sexually Active: Not Currently   Other Topics Concern  . Not on file   Social History Narrative   Lives with her  significant other and 2 grandchildren. 1 childHas 7 brothers and 4 sisters, 1 twin sister.Unemployed, worked in [employer]. Abuses alcohol-drinks 1 glass of wine daily No drug use. Former cigarette use quit 1.5 years ago.  11 th grade education    Review of Systems: General: +weight loss, denies sick contacts, denies fevers HEENT: denies dysphagia CV: mild chest pain associated with coughing, +palpitations Lungs: denies sob Abdomen/GU: denies ab pain, denies constipation/diarrhea, denies blood in urine or stool, denies dysuria Extremities: denies swelling lower extremities MSK: denies muscle aches Skin: denies skin breakdown.  Physical Exam: Blood pressure 111/81, pulse 109, temperature 99.5 F (37.5 C), temperature source Oral, resp. rate 18, height 5\' 1"  (1.549 m), weight 78 lb 4.8 oz (35.517 kg), SpO2 97.00%. General: cachetic, lying in bed nad, alert and oriented x 3 HEENT: [place]/at, wet oral mucosa CV: tachycardic, no murmurs Lungs: min wheezes and crackle bibasilar left >right Abdomen: soft, hypoactive bs, ntnd Extremities: warm, no cyanosis or edema Neuro: intact, CN 2-12 grossly intact  Lab results: Basic Metabolic Panel:  Basename 05/20/12 1215 05/20/12 1035  NA 131* 138  K 3.1* 2.9*  CL 98 104  CO2 14* --  GLUCOSE 134* 131*  BUN 16 15  CREATININE 1.47* 1.40*  CALCIUM 4.9* --  MG 0.5* --  PHOS -- --   Liver Function Tests:  [facility] 05/20/12 1215  AST 35  ALT 7  ALKPHOS 197*  BILITOT 0.4  PROT 8.0  ALBUMIN 1.7*   CBC:  Basename 05/20/12 1035 05/20/12 0909  WBC -- 13.8*  NEUTROABS -- 8.9*  HGB 8.2* 8.8*  HCT 24.0* 23.9*  MCV -- 105.3*  PLT -- 136*   Misc. Labs:gram stain, legionella antigen, strep pneumonia, HIV, blood cultures  Imaging results:  Dg Chest 2  View  05/20/2012  *RADIOLOGY REPORT*  Clinical Data: Cough with chest pain.  CHEST - 2 VIEW  Comparison: 12/21/2010 and 10/23/2009.  Findings: The heart size and mediastinal contours are stable.  The lungs are hyperinflated.  There is new left lower lobe air space disease with resulting increased density over the lower thoracic spine on the lateral view.  This finding is suspicious for pneumonia.  There is no focal airspace disease in the right lung. However, there is a questionable new nodular density at the confluence of the right second and fifth ribs on the frontal examination.  There is no pleural effusion.  Calcifications are present within the upper abdomen consistent with chronic calcific pancreatitis.  There is deformity of the right humeral neck consistent with an age indeterminate fracture.  IMPRESSION:  1.  Left lower lobe pneumonia.  This should be followed to radiographic clearing to exclude other etiologies. 2.  Questionable right upper lobe nodule.  Attention to this area on radiographic followup is recommended.  If persistent, this may require CT for complete evaluation. 3.  Calcific pancreatitis.   Original Report Authenticated By: Carey Bullocks, M.D.     Other results: EKG: sinus tachycardia  Assessment & Plan by Problem: 56 y.o with extensive PMH presenting for sepsis related to left upper lobe pneumonia with associated symptoms as mentioned per HPI.    1.  *Pneumonia (left upper lobe) likely CAP -CXR with left lower lobe pneumonia. Questionable right upper lobe nodule.   -Associated with cough, leukocytosis, tachycardic, mild tachypnea qualifies for sepsis -Continued  Azithromycin, Rocephin -pending labs to follow   2. Electrolyte abnormalities (Hypomagnesemia, Hypokalemia, Hypocalcemia) -Could be secondary to poor nutritional status for the last 3 weeks,  as well as poor absorption in setting of chronic pancreatitis.  Patient reports compliance with pancreatic enzyme therapy.   K 2.9 on admission 3.1, Calcium 4.9 with ionized Calcium 0.65, Magnesium 0.5.   -will trend labs -Rx Calcium gluconate 500 mg qd, Magnesium 4 g  3. Acute renal Failure (baseline ~0.8) Creatinine 1.47 increased since 03/2012 when it was 0.78.  This could be secondary to decreased oral intake.  Patient had also been taking significant amt of NSAIDs prior to admission (Voltaren 75mg  PO BID and ibuprofen 200 q6h prn).   -orthostatic vital signs -Hold renal toxic drugs & antihypertensive med (HCTZ, amlodipine) -Monitor renal function with AM labs -IVF hydration with NS @ 100cc/h  4. F/E/N -NS 100 cc/hr  -will follow electrolytes -diet ordered   5. DVT Px  -Lovenox.   Dispo: Disposition is deferred at this time, awaiting improvement of current medical problems. Anticipated discharge in approximately 2-3 day(s).   The patient does have a current PCP (BHARDWAJ, SHILPA, MD), therefore will be requiring [facility]PC follow-up after discharge.   The patient does not have transportation limitations that hinder transportation to clinic appointments.  SignedAnnett Gula [phone] 05/20/2012, 5:10 PM

## 2012-05-20 NOTE — Progress Notes (Signed)
CRITICAL VALUE ALERT  Critical value received: Mg 0.5  Date of notification:  05/20/12  Time of notification: 1510  Critical value read back:yes  Nurse who received alert:  Lajuana Matte, RN   MD notified (1st page): Stacy Gardner, MD  Time of first page: verbal notification in patient's room  Responding MD: Stacy Gardner, MD  Time MD responded:  1515 - put order in for Mg

## 2012-05-20 NOTE — ED Provider Notes (Signed)
Medical screening examination/treatment/procedure(s) were performed by non-physician practitioner and as supervising physician I was immediately available for consultation/collaboration.   Yahayra Geis, MD 05/20/12 1521 

## 2012-05-20 NOTE — ED Notes (Signed)
Patient states cold symptoms x 3 weeks, productive cough, sweats, poor appetite

## 2012-05-20 NOTE — ED Notes (Signed)
Results of chem 8 shown to PA-C, [employer], T

## 2012-05-20 NOTE — ED Provider Notes (Signed)
Received in critical calcium value of 4.9. I notified Kirichenko, PA-C, who saw the patient in B Pod.  She tells me that she already alerted the admitting team of this.  Patient had been transferred out of CDU, and the care will be continued by the admitting team.  Roxy Horseman, PA-C 05/20/12 1406

## 2012-05-20 NOTE — ED Notes (Signed)
REPORT CALLED TO RECEIVING NURSE. SHE IS AWARE OF PT LABS.

## 2012-05-20 NOTE — ED Provider Notes (Signed)
History     CSN: [id number]  Arrival date & time 05/20/12  4098   First MD Initiated Contact with Patient 05/20/12 0830      Chief Complaint  Patient presents with  . Cough  . URI    (Consider location/radiation/quality/duration/timing/severity/associated sxs/prior treatment)   [name] [name] is a 56 y.o. female who presents with complaint of nasal congestion, sore throat, cough for 3 weeks. States taking over the counter medications with no improvement. Reports night sweats, chills, no fever. Cough is productive with thick sputum. States has not eaten in 3 wks, due to no appetite and nausea. States has been drinking plenty of water. Denies abdominal pain, denies chest pain, denies SOB. Nothing making symptoms better or worse. No headache, neck pain or stiffness, no photophobia.   Past Medical History  Diagnosis Date  . Anemia, B12 deficiency   . History of acute pancreatitis   . Right knee pain     No recent imaging on chart  . 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 Dr. Su Hilt, [facility]  . Hypertriglyceridemia   . GERD (gastroesophageal reflux disease)   . Vitamin D deficiency   . Subdural hematoma 02/2008    Likely 2/2 trauma from seizure from EtOH withdrawal, chronic in nature, sees Dr. Robyne Askew. Most recent CT head 10/2009 showing stable but persistent hematoma without mass effect.  . History of seizure disorder     Likely 2/2 alcohol abuse  . Hypocalcemia   . Hypomagnesemia   . Failure to thrive in childhood     Unclear etiology  . HTN (hypertension)   . Thrombocytopenia   . Anemia, macrocytic   . Hepatomegaly     On exam  . Alcohol abuse   . Joint pain   . Alcohol abuse   . Arthritis   . Vitamin D deficiency   . Menopause   . Pancreatitis   . Insomnia   . Hyperlipidemia   . Sinusitis   . Pernicious anemia   . Subdural hematoma   . Macrocytic anemia   . Hepatomegaly   .  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 Seizures     1.5 YRS  LAST ONE  . Depression   . Hepatitis     HEPATOMEGALY   . Fx humeral neck 04/17/2011    Transverse fracture- minimally displaced- managed as outpatient   . ABNORMAL PAP SMEAR, LGSIL 07/23/2008    Annotation: HPV positive CIN I Dr. Su Hilt, [facility] Qualifier: Diagnosis of  By: Danae Chen      Past Surgical History  Procedure Date  . Cesarean section 1983  . Esophagogastroduodenoscopy 07/11/2011    Procedure: ESOPHAGOGASTRODUODENOSCOPY (EGD);  Surgeon: Theda Belfast, MD;  Location: [facility];  Service: Endoscopy;  Laterality: N/A;  . Colonoscopy 07/11/2011    Procedure: COLONOSCOPY;  Surgeon: Theda Belfast, MD;  Location: [facility];  Service: Endoscopy;  Laterality: N/A;  . Eye surgery     LEFT EYE YRS AGO   . Rt colectomy 08/28/2011    Family History  Problem Relation Age of Onset  . Cancer Mother     Died from stomach cancer and "flesh eating rash  . Heart failure Father     Died in 19s from an MI  . Alcohol abuse Sister     Twin sister drinks a lot, as did both her parents and brothers  .  Stroke Brother     Has 7 brothers, 1 with CVA    History  Substance Use Topics  . Smoking status: Former Smoker    Types: Cigarettes    Quit date: 09/20/2010  . Smokeless tobacco: Never Used  . Alcohol Use: Yes     Comment: 1-2 glasses wine a day    OB History    Grav Para Term Preterm Abortions TAB SAB Ect Mult Living                  Review of Systems  Constitutional: Positive for chills, appetite change and fatigue. Negative for fever.  HENT: Positive for congestion, sore throat and sinus pressure. Negative for neck pain and neck stiffness.   Eyes: Negative for photophobia and visual disturbance.  Respiratory: Positive for cough. Negative for chest tightness, shortness of breath and wheezing.   Cardiovascular: Negative for chest pain and palpitations.  Gastrointestinal: Positive  for nausea. Negative for vomiting, abdominal pain, diarrhea, constipation and abdominal distention.  Genitourinary: Negative for dysuria and flank pain.  Musculoskeletal: Positive for myalgias.  Skin: Negative for rash.  Neurological: Positive for weakness. Negative for dizziness, light-headedness and headaches.  Psychiatric/Behavioral: Negative.     Allergies  Amitriptyline hcl and Doxycycline hyclate  Home Medications   Current Outpatient Rx  Name  Route  Sig  Dispense  Refill  . AMLODIPINE BESYLATE 10 MG PO TABS   Oral   Take 10 mg by mouth daily.         . AMYLASE-LIPASE-PROTEASE 20-4.5-25 MU PO CPEP   Oral   Take 1 capsule by mouth 3 (three) times daily with meals.          Marland Kitchen CALCIUM GLUCONATE 500 MG PO TABS   Oral   Take 500 mg by mouth daily.         . CYANOCOBALAMIN 1000 MCG/ML IJ SOLN   Intramuscular   Inject 1,000 mcg into the muscle every 30 (thirty) days. Last day of month. Last dose last month         . DICLOFENAC SODIUM 75 MG PO TBEC   Oral   Take 75 mg by mouth 2 (two) times daily.         Marland Kitchen DICLOFENAC SODIUM 1 % TD GEL   Topical   Apply 2 g topically 4 (four) times daily as needed.   100 g   0   . FLUOXETINE HCL 10 MG PO CAPS   Oral   Take 10 mg by mouth daily.          Marland Kitchen FOLIC ACID 1 MG PO TABS   Oral   Take 1 tablet (1 mg total) by mouth daily.   30 tablet   11   . GABAPENTIN 300 MG PO CAPS   Oral   Take 2 capsules (600 mg total) by mouth 3 (three) times daily.   90 capsule   2   . HYDROCHLOROTHIAZIDE 25 MG PO TABS   Oral   Take 25 mg by mouth daily.          . IBUPROFEN 200 MG PO TABS   Oral   Take 200 mg by mouth every 6 (six) hours as needed. For pain         . LEVETIRACETAM 500 MG PO TABS   Oral   Take 500 mg by mouth 2 (two) times daily.         Marland Kitchen MAGNESIUM OXIDE 400 MG PO TABS   Oral  Take 400 mg by mouth 2 (two) times daily.         . ADULT MULTIVITAMIN W/MINERALS CH   Oral   Take 1 tablet by  mouth daily.   30 tablet   11   . OXYCODONE-ACETAMINOPHEN 5-325 MG PO TABS   Oral   Take 1 tablet by mouth every 4 (four) hours as needed. For pain         . PROMETHAZINE HCL 12.5 MG PO TABS   Oral   Take 12.5 mg by mouth every 6 (six) hours as needed. For nausea         . VITAMIN B-1 100 MG PO TABS   Oral   Take 100 mg by mouth 2 (two) times daily.          Marland Kitchen VITAMIN D (ERGOCALCIFEROL) 50000 UNITS PO CAPS   Oral   Take 50,000 Units by mouth every 7 (seven) days. Take on Thursdays.           BP 108/76  Pulse 134  Temp 98.5 F (36.9 C) (Oral)  Resp 22  SpO2 99%  Physical Exam  Nursing note and vitals reviewed. Constitutional: She is oriented to person, place, and time. She appears well-developed and well-nourished. No distress.  HENT:  Head: Normocephalic.  Right Ear: External ear normal.  Left Ear: External ear normal.  Mouth/Throat: Oropharynx is clear and moist. No oropharyngeal exudate.       Clear nasal congestion  Eyes: Conjunctivae normal are normal.  Neck: Neck supple.  Cardiovascular: Normal rate, regular rhythm and normal heart sounds.   Pulmonary/Chest: Effort normal. No respiratory distress.       diminished breath sounds at bases bilaterally  Abdominal: Soft. Bowel sounds are normal. She exhibits no distension. There is no tenderness. There is no rebound.  Musculoskeletal: She exhibits no edema.  Neurological: She is alert and oriented to person, place, and time.  Skin: Skin is warm and dry.    ED Course  Procedures (including critical care time)  Results for orders placed during the hospital encounter of 05/20/12  CBC WITH DIFFERENTIAL      Component Value Range   WBC 13.8 (*) 4.0 - 10.5 K/uL   RBC 2.27 (*) 3.87 - 5.11 MIL/uL   Hemoglobin 8.8 (*) 12.0 - 15.0 g/dL   HCT 16.1 (*) 09.6 - 04.5 %   MCV 105.3 (*) 78.0 - 100.0 fL   MCH 38.8 (*) 26.0 - 34.0 pg   MCHC 36.8 (*) 30.0 - 36.0 g/dL   RDW 40.9  81.1 - 91.4 %   Platelets 136 (*) 150  - 400 K/uL   Neutrophils Relative 65  43 - 77 %   Lymphocytes Relative 15  12 - 46 %   Monocytes Relative 20 (*) 3 - 12 %   Eosinophils Relative 0  0 - 5 %   Basophils Relative 0  0 - 1 %   Neutro Abs 8.9 (*) 1.7 - 7.7 K/uL   Lymphs Abs 2.1  0.7 - 4.0 K/uL   Monocytes Absolute 2.8 (*) 0.1 - 1.0 K/uL   Eosinophils Absolute 0.0  0.0 - 0.7 K/uL   Basophils Absolute 0.0  0.0 - 0.1 K/uL   WBC Morphology ATYPICAL LYMPHOCYTES    POCT I-STAT, CHEM 8      Component Value Range   Sodium 138  135 - 145 mEq/L   Potassium 2.9 (*) 3.5 - 5.1 mEq/L   Chloride 104  96 - 112 mEq/L  BUN 15  6 - 23 mg/dL   Creatinine, Ser 1.61 (*) 0.50 - 1.10 mg/dL   Glucose, Bld 096 (*) 70 - 99 mg/dL   Calcium, Ion 0.45 (*) 1.12 - 1.23 mmol/L   TCO2 16  0 - 100 mmol/L   Hemoglobin 8.2 (*) 12.0 - 15.0 g/dL   HCT 40.9 (*) 81.1 - 91.4 %  COMPREHENSIVE METABOLIC PANEL      Component Value Range   Sodium 131 (*) 135 - 145 mEq/L   Potassium 3.1 (*) 3.5 - 5.1 mEq/L   Chloride 98  96 - 112 mEq/L   CO2 14 (*) 19 - 32 mEq/L   Glucose, Bld 134 (*) 70 - 99 mg/dL   BUN 16  6 - 23 mg/dL   Creatinine, Ser 7.82 (*) 0.50 - 1.10 mg/dL   Calcium 4.9 (*) 8.4 - 10.5 mg/dL   Total Protein 8.0  6.0 - 8.3 g/dL   Albumin 1.7 (*) 3.5 - 5.2 g/dL   AST 35  0 - 37 U/L   ALT 7  0 - 35 U/L   Alkaline Phosphatase 197 (*) 39 - 117 U/L   Total Bilirubin 0.4  0.3 - 1.2 mg/dL   GFR calc non Af Amer 39 (*) >90 mL/min   GFR calc Af Amer 45 (*) >90 mL/min  PATHOLOGIST SMEAR REVIEW      Component Value Range   Path Review MODERATE ANEMIA WITH    MAGNESIUM      Component Value Range   Magnesium 0.5 (*) 1.5 - 2.5 mg/dL   Dg Chest 2 View  95/62/1308  *RADIOLOGY REPORT*  Clinical Data: Cough with chest pain.  CHEST - 2 VIEW  Comparison: 12/21/2010 and 10/23/2009.  Findings: The heart size and mediastinal contours are stable.  The lungs are hyperinflated.  There is new left lower lobe air space disease with resulting increased density over the  lower thoracic spine on the lateral view.  This finding is suspicious for pneumonia.  There is no focal airspace disease in the right lung. However, there is a questionable new nodular density at the confluence of the right second and fifth ribs on the frontal examination.  There is no pleural effusion.  Calcifications are present within the upper abdomen consistent with chronic calcific pancreatitis.  There is deformity of the right humeral neck consistent with an age indeterminate fracture.  IMPRESSION:  1.  Left lower lobe pneumonia.  This should be followed to radiographic clearing to exclude other etiologies. 2.  Questionable right upper lobe nodule.  Attention to this area on radiographic followup is recommended.  If persistent, this may require CT for complete evaluation. 3.  Calcific pancreatitis.   Original Report Authenticated By: Carey Bullocks, M.D.         1. PNA (pneumonia)   2. Hypocalcemia   3. Hypokalemia   4. Dehydration       MDM  Pt with SOB, cough, congestion for 3 weeks. She has had loss of appetite, generalized malaise, sweats. CXR shows pneumonia. WBC elevated at 13.8. Covered with rocephin and zithromax. Potassium replaced. Pt continues to be tachycardic despite fluids. Will admit for dehydration, hypocalcemia, hypokalemia. Admitting team, internal medicine, informed of all the abnormal labs, they will come see pt.         Lottie Mussel, PA 05/20/12 1639

## 2012-05-20 NOTE — Progress Notes (Signed)
CRITICAL VALUE ALERT  Critical value received:  Calcium 4.5  Date of notification:  05/20/2012  Time of notification:  2211  Critical value read back:yes  Nurse who received alert:  Janine Ores, RN  MD notified (1st page):  Yes; [phone]  Time of first page:  2215  MD notified (2nd page):  Time of second page:  Responding MD:  Dr. Garald Braver  Time MD responded: 2221

## 2012-05-21 DIAGNOSIS — J13 Pneumonia due to Streptococcus pneumoniae: Secondary | ICD-10-CM

## 2012-05-21 DIAGNOSIS — D509 Iron deficiency anemia, unspecified: Secondary | ICD-10-CM

## 2012-05-21 DIAGNOSIS — A403 Sepsis due to Streptococcus pneumoniae: Principal | ICD-10-CM

## 2012-05-21 DIAGNOSIS — R000 Tachycardia, unspecified: Secondary | ICD-10-CM

## 2012-05-21 LAB — CBC WITH DIFFERENTIAL/PLATELET
Basophils Absolute: 0 10*3/uL (ref 0.0–0.1)
Basophils Relative: 0 % (ref 0–1)
Eosinophils Absolute: 0.1 10*3/uL (ref 0.0–0.7)
Eosinophils Relative: 1 % (ref 0–5)
HCT: 20.6 % — ABNORMAL LOW (ref 36.0–46.0)
MCHC: 35.9 g/dL (ref 30.0–36.0)
MCV: 102 fL — ABNORMAL HIGH (ref 78.0–100.0)
Monocytes Absolute: 2 10*3/uL — ABNORMAL HIGH (ref 0.1–1.0)
Neutro Abs: 6.2 10*3/uL (ref 1.7–7.7)
RDW: 13 % (ref 11.5–15.5)

## 2012-05-21 LAB — LEGIONELLA ANTIGEN, URINE

## 2012-05-21 LAB — BASIC METABOLIC PANEL
BUN: 11 mg/dL (ref 6–23)
Chloride: 109 mEq/L (ref 96–112)
Creatinine, Ser: 0.97 mg/dL (ref 0.50–1.10)
GFR calc Af Amer: 75 mL/min — ABNORMAL LOW (ref >90)

## 2012-05-21 MED ORDER — DEXTROMETHORPHAN POLISTIREX 30 MG/5ML PO LQCR
15.0000 mg | Freq: Two times a day (BID) | ORAL | Status: DC | PRN
  Administered 2012-05-21: 15 mg via ORAL
  Filled 2012-05-21: qty 5

## 2012-05-21 MED ORDER — WHITE PETROLATUM GEL
Status: DC | PRN
  Administered 2012-05-21: 0.2 via TOPICAL
  Filled 2012-05-21: qty 28.35
  Filled 2012-05-21: qty 5

## 2012-05-21 MED ORDER — CALCIUM CARBONATE 1250 (500 CA) MG PO TABS
1.0000 | ORAL_TABLET | Freq: Three times a day (TID) | ORAL | Status: DC
  Administered 2012-05-21 – 2012-05-23 (×8): 500 mg via ORAL
  Filled 2012-05-21 (×13): qty 1

## 2012-05-21 MED ORDER — ENSURE COMPLETE PO LIQD
237.0000 mL | Freq: Every day | ORAL | Status: DC
  Administered 2012-05-21 – 2012-05-22 (×2): 237 mL via ORAL

## 2012-05-21 MED ORDER — DEXTROMETHORPHAN POLISTIREX 30 MG/5ML PO LQCR
15.0000 mg | ORAL | Status: DC | PRN
  Administered 2012-05-21 – 2012-05-23 (×5): 15 mg via ORAL
  Filled 2012-05-21 (×7): qty 5

## 2012-05-21 NOTE — Progress Notes (Signed)
IV calcium delayed r/t loss of IV access. Medication being administered at a slower rate to prevent vein irritation/ loss of access in left hand; OK per pharmacy. Will continue to watch site and monitor pt closely.

## 2012-05-21 NOTE — H&P (Signed)
Internal Medicine Teaching Service Attending Note Date: 05/21/2012  Patient name: [name] [name]  Medical record number: [id number]  Date of birth: 03/16/1956   Chief Complaint: Cough . History of Present Illness The patient, [name] [name], is a 56 y.o. year old female, with past medical history as given below, who comes in with the chief complaint of cough . I have read the history documented by Dr.Sharda and I concur with the chronology of events.   When I met with the patient today, she appeared relaxed. She had had a good night and felt better with the already started treatment. She denied chest pain, palpitations, shortness of breath, abdominal pain, nausea, vomiting, sick contacts, fever or chills. She says she has dry cough, sweating and weakness for the past 3 weeks. She is my clinic patient and I have seen her once before. Unrelated to this visit, she used to be a cocaine user but denies using for the past 1.5 years.     Past Medical History   has a past medical history of Anemia, B12 deficiency; History of acute pancreatitis; Right knee pain; Abnormal Pap smear and cervical HPV (human papillomavirus); Hypertriglyceridemia; GERD (gastroesophageal reflux disease); Vitamin D deficiency; Subdural hematoma (02/2008); History of seizure disorder; Hypocalcemia; Hypomagnesemia; Failure to thrive in childhood; HTN (hypertension); Thrombocytopenia; Anemia, macrocytic; Hepatomegaly; Alcohol abuse; Joint pain; Alcohol abuse; Arthritis; Vitamin D deficiency; Menopause; Pancreatitis; Insomnia; Hyperlipidemia; Sinusitis; Pernicious anemia; Subdural hematoma; Macrocytic anemia; Hepatomegaly; Tuberculosis; Seizures; Depression; Hepatitis; Fx humeral neck (04/17/2011); ABNORMAL PAP SMEAR, LGSIL (07/23/2008); and Pneumonia (05/20/2012).  Medications  Reviewed  Family History family history includes Alcohol abuse in her sister; Cancer in her mother; Heart failure in her father; and Stroke in her  brother.  Social History  reports that she quit smoking about 20 months ago. Her smoking use included Cigarettes. She has never used smokeless tobacco. She reports that she drinks alcohol. She reports that she does not use illicit drugs.  Review of Systems Positive for - cough Negative for - see HPI  Vital Signs: Filed Vitals:   05/20/12 1450 05/20/12 1836 05/20/12 2010 05/21/12 0311  BP:  120/81 106/72 97/70  Pulse:  116 116 123  Temp:  98.3 F (36.8 C) 100.2 F (37.9 C) 100 F (37.8 C)  TempSrc:  Oral Oral Oral  Resp:  20 20 20   Height:      Weight:      SpO2: 97% 99% 96% 100%    Physical Exam:  I met with patient around 9am today  Vitals reviewed.  General: Resting in bed. HEENT: PERRL, EOMI, no scleral icterus. Cervical lymphadenopathy + Heart: RRR, no rubs, murmurs or gallops. Lungs:Rales left lower lobe Abdomen: Soft, nontender, nondistended, BS present. Extremities: Warm, no pedal edema. Neuro: Alert and oriented X3, cranial nerves II-XII grossly intact,  strength and sensation to light touch equal in bilateral upper and lower extremities  Lab results: CMP     Component Value Date/Time   NA 137 05/21/2012 0426   K 4.0 05/21/2012 0426   CL 109 05/21/2012 0426   CO2 14* 05/21/2012 0426   GLUCOSE 146* 05/21/2012 0426   BUN 11 05/21/2012 0426   CREATININE 0.97 05/21/2012 0426   CREATININE 0.78 03/26/2012 1106   CALCIUM 5.3* 05/21/2012 0426   CALCIUM 5.6* 12/22/2010 1010   PROT 8.0 05/20/2012 1215   ALBUMIN 1.7* 05/20/2012 1215   AST 35 05/20/2012 1215   ALT 7 05/20/2012 1215   ALKPHOS 197* 05/20/2012 1215   BILITOT  0.4 05/20/2012 1215   GFRNONAA 65* 05/21/2012 0426   GFRAA 75* 05/21/2012 0426   CBC    Component Value Date/Time   WBC 10.4 05/21/2012 0426   WBC 7.2 10/26/2011 1336   RBC 2.02* 05/21/2012 0426   RBC 2.98* 10/26/2011 1336   HGB 7.4* 05/21/2012 0426   HGB 9.7* 10/26/2011 1336   HCT 20.6* 05/21/2012 0426   HCT 28.8* 10/26/2011 1336    PLT 138* 05/21/2012 0426   PLT 125* 10/26/2011 1336   MCV 102.0* 05/21/2012 0426   MCV 96.6 10/26/2011 1336   MCH 36.6* 05/21/2012 0426   MCH 32.6 10/26/2011 1336   MCHC 35.9 05/21/2012 0426   MCHC 33.7 10/26/2011 1336   RDW 13.0 05/21/2012 0426   RDW 13.6 10/26/2011 1336   LYMPHSABS 2.1 05/21/2012 0426   LYMPHSABS 2.7 10/26/2011 1336   MONOABS 2.0* 05/21/2012 0426   MONOABS 0.8 10/26/2011 1336   EOSABS 0.1 05/21/2012 0426   EOSABS 0.1 10/26/2011 1336   BASOSABS 0.0 05/21/2012 0426   BASOSABS 0.0 10/26/2011 1336    Urinalysis    Component Value Date/Time   COLORURINE YELLOW 09/05/2011 1815   APPEARANCEUR CLEAR 09/05/2011 1815   LABSPEC 1.011 09/05/2011 1815   PHURINE 6.5 09/05/2011 1815   GLUCOSEU NEGATIVE 09/05/2011 1815   HGBUR NEGATIVE 09/05/2011 1815   BILIRUBINUR NEGATIVE 09/05/2011 1815   KETONESUR NEGATIVE 09/05/2011 1815   PROTEINUR NEGATIVE 09/05/2011 1815   UROBILINOGEN 0.2 09/05/2011 1815   NITRITE NEGATIVE 09/05/2011 1815   LEUKOCYTESUR TRACE* 09/05/2011 1815   Strep Pneumo urinanry antigen is positive.  Imaging results:  Dg Chest 2 View  05/20/2012  *RADIOLOGY REPORT*  Clinical Data: Cough with chest pain.  CHEST - 2 VIEW  Comparison: 12/21/2010 and 10/23/2009.  Findings: The heart size and mediastinal contours are stable.  The lungs are hyperinflated.  There is new left lower lobe air space disease with resulting increased density over the lower thoracic spine on the lateral view.  This finding is suspicious for pneumonia.  There is no focal airspace disease in the right lung. However, there is a questionable new nodular density at the confluence of the right second and fifth ribs on the frontal examination.  There is no pleural effusion.  Calcifications are present within the upper abdomen consistent with chronic calcific pancreatitis.  There is deformity of the right humeral neck consistent with an age indeterminate fracture.  IMPRESSION:  1.  Left lower lobe pneumonia.  This should be  followed to radiographic clearing to exclude other etiologies. 2.  Questionable right upper lobe nodule.  Attention to this area on radiographic followup is recommended.  If persistent, this may require CT for complete evaluation. 3.  Calcific pancreatitis.   Original Report Authenticated By: Carey Bullocks, M.D.     Assessment and Plan: Community Acquired Streptococcal Pneumonia: I agree with the current management of the patient on Azithromycin and Ceftriaxone IV. Robitussin for cough. Blood cultures have been collected. The patient already looks clinically improved. If she continues to improve, we will transition to oral antibiotics tomorrow and discharge.   Electrolyte imbalances: This patient has chronic electrolyte imbalances probably secondary to malnutrition. She has been repleated with potassium, magnesium and calcium. We will check her TSh, with Free T4 and Total T3.    Acute renal insufficiency: Is resolved.    Other chronic issues per resident note.   Thanks, Aletta Edouard, MD 12/17/201311:47 AM

## 2012-05-21 NOTE — ED Provider Notes (Signed)
Medical screening examination/treatment/procedure(s) were conducted as a shared visit with non-physician practitioner(s) and myself.  I personally evaluated the patient during the encounter   Poet Hineman, MD 05/21/12 0708 

## 2012-05-21 NOTE — Care Management Note (Unsigned)
    Page 1 of 1   05/21/2012     2:59:03 PM   CARE MANAGEMENT NOTE 05/21/2012  Patient:  [name] [name], [name] [name]   Account Number:  [id number]  Date Initiated:  05/21/2012  Documentation initiated by:  Angelino Rumery  Subjective/Objective Assessment:   PT ADM ON 05/20/12 WITH PNEUMONIA.  PTA, PT INDEPENDENT, LIVES WITH BOYFRIEND AND GRANDCHILDREN.     Action/Plan:   MET WITH PT TO DISCUSS DC PLANS.  PT STATES SHE HAS ASSISTANCE AT HOME, IV NEEDED.  WILL FOLLOW FOR HOME NEEDS AS PT PROGRESSES.   Anticipated DC Date:  05/23/2012   Anticipated DC Plan:  HOME/SELF CARE  In-house referral  Clinical Social Worker      DC Planning Services  CM consult      Choice offered to / List presented to:             Status of service:  In process, will continue to follow Medicare Important Message given?   (If response is "NO", the following Medicare IM given date fields will be blank) Date Medicare IM given:   Date Additional Medicare IM given:    Discharge Disposition:    Per UR Regulation:  Reviewed for med. necessity/level of care/duration of stay  If discussed at Long Length of Stay Meetings, dates discussed:    Comments:

## 2012-05-21 NOTE — Progress Notes (Signed)
CRITICAL VALUE ALERT  Critical value received:  Calcium 5.3  Date of notification:  05/21/2012  Time of notification:  0650  Critical value read back:yes  Nurse who received alert:  Janine Ores, RN  MD notified (1st page):  Dr. Garald Braver  Time of first page:  [phone]  MD notified (2nd page):  Time of second page:  Responding MD:  Dr. Garald Braver  Time MD responded:  0700

## 2012-05-21 NOTE — Progress Notes (Signed)
Attempted new IV access (Susie, RN and Redden, Charity fundraiser) with no success x's 3. IV team called.

## 2012-05-21 NOTE — Progress Notes (Addendum)
Subjective: Patient still coughing with yellow phelgm.  She states cough kept her up last night.  She has chest pain with coughing.  Denies sob. Pt denies drug use for 5 years.    Objective: Vital signs in last 24 hours: Filed Vitals:   05/20/12 1450 05/20/12 1836 05/20/12 2010 05/21/12 0311  BP:  120/81 106/72 97/70  Pulse:  116 116 123  Temp:  98.3 F (36.8 C) 100.2 F (37.9 C) 100 F (37.8 C)  TempSrc:  Oral Oral Oral  Resp:  20 20 20   Height:      Weight:      SpO2: 97% 99% 96% 100%   Weight change:   Intake/Output Summary (Last 24 hours) at 05/21/12 1358 Last data filed at 05/21/12 0900  Gross per 24 hour  Intake    600 ml  Output    500 ml  Net    100 ml   General: sitting up in bed, nad, alert and oriented x 3 HEENT: Victoria/at CV: tachycardic, no murmurs Lungs: crackles b/l bases Abdomen: soft, ntnd, normal bs  Extremities: no cyanosis or edema, warm Neuro: CN 2-12 grossly intact, moving all 4 extremities   Lab Results: Basic Metabolic Panel:  Lab 05/21/12 4098 05/20/12 2113  NA 137 130*  K 4.0 3.2*  CL 109 103  CO2 14* 13*  GLUCOSE 146* 172*  BUN 11 13  CREATININE 0.97 0.98  CALCIUM 5.3* 4.5*  MG 1.6 2.1  PHOS -- --   Liver Function Tests:  Lab 05/20/12 1215  AST 35  ALT 7  ALKPHOS 197*  BILITOT 0.4  PROT 8.0  ALBUMIN 1.7*   CBC:  Lab 05/21/12 0426 05/20/12 1035 05/20/12 0909  WBC 10.4 -- 13.8*  NEUTROABS 6.2 -- 8.9*  HGB 7.4* 8.2* --  HCT 20.6* 24.0* --  MCV 102.0* -- 105.3*  PLT 138* -- 136*   Misc. Labs: Pending troponin, TSH, T4   Micro Results: Recent Results (from the past 240 hour(s))  CULTURE, BLOOD (ROUTINE X 2)     Status: Normal (Preliminary result)   Collection Time   05/20/12  3:38 PM      Component Value Range Status Comment   Specimen Description BLOOD HAND RIGHT   Final    Special Requests BOTTLES DRAWN AEROBIC ONLY 5CC   Final    Culture  Setup Time 05/20/2012 23:24   Final    Culture     Final    Value:         BLOOD CULTURE RECEIVED NO GROWTH TO DATE CULTURE WILL BE HELD FOR 5 DAYS BEFORE ISSUING A FINAL NEGATIVE REPORT   Report Status PENDING   Incomplete   CULTURE, BLOOD (ROUTINE X 2)     Status: Normal (Preliminary result)   Collection Time   05/20/12  3:45 PM      Component Value Range Status Comment   Specimen Description BLOOD HAND LEFT   Final    Special Requests BOTTLES DRAWN AEROBIC ONLY 5CC   Final    Culture  Setup Time 05/20/2012 23:24   Final    Culture     Final    Value:        BLOOD CULTURE RECEIVED NO GROWTH TO DATE CULTURE WILL BE HELD FOR 5 DAYS BEFORE ISSUING A FINAL NEGATIVE REPORT   Report Status PENDING   Incomplete    Studies/Results: Dg Chest 2 View  05/20/2012  *RADIOLOGY REPORT*  Clinical Data: Cough with chest pain.  CHEST -  2 VIEW  Comparison: 12/21/2010 and 10/23/2009.  Findings: The heart size and mediastinal contours are stable.  The lungs are hyperinflated.  There is new left lower lobe air space disease with resulting increased density over the lower thoracic spine on the lateral view.  This finding is suspicious for pneumonia.  There is no focal airspace disease in the right lung. However, there is a questionable new nodular density at the confluence of the right second and fifth ribs on the frontal examination.  There is no pleural effusion.  Calcifications are present within the upper abdomen consistent with chronic calcific pancreatitis.  There is deformity of the right humeral neck consistent with an age indeterminate fracture.  IMPRESSION:  1.  Left lower lobe pneumonia.  This should be followed to radiographic clearing to exclude other etiologies. 2.  Questionable right upper lobe nodule.  Attention to this area on radiographic followup is recommended.  If persistent, this may require CT for complete evaluation. 3.  Calcific pancreatitis.   Original Report Authenticated By: Carey Bullocks, M.D.    Medications:  Scheduled Meds:    . azithromycin  500 mg Oral  Q24H  . calcium carbonate  1 tablet Oral TID WC  . cefTRIAXone (ROCEPHIN)  IV  1 g Intravenous Q24H  . enoxaparin  30 mg Subcutaneous Q24H  . feeding supplement  237 mL Oral Q1500  . FLUoxetine  10 mg Oral Daily  . folic acid  1 mg Oral Daily  . gabapentin  600 mg Oral TID  . levETIRAcetam  500 mg Oral BID  . lipase/protease/amylase  1 capsule Oral TID AC  . magnesium oxide  400 mg Oral BID  . multivitamin with minerals  1 tablet Oral Daily  . thiamine  100 mg Oral BID  . Vitamin D (Ergocalciferol)  50,000 Units Oral Q7 days   Continuous Infusions:    . sodium chloride 100 mL/hr at 05/20/12 1617   PRN Meds:.dextromethorphan, LORazepam, LORazepam, oxyCODONE-acetaminophen Assessment/Plan: 56 y.o with extensive PMH presenting for sepsis related to left upper lobe pneumonia with associated symptoms as mentioned per HPI.  1. *Strep Pneumonia (left upper lobe) likely CAP  -CXR with left lower lobe pneumonia. Questionable right upper lobe nodule. With + Strep pneumonia antigen  -Associated with cough, resolved leukocytosis, tachycardic, mild tachypnea qualifies for sepsis  -Continued Azithromycin, Rocephin (day 2) will transition to oral antibiotics tomorrow consider augmentin 875 mg bid x 5 more days total tx duration 7 days.  Patient is allergic to Doxy which is recommended for pt at risk prolonged QT -blood cultures NTD and pending, HIV neg.    2. Electrolyte abnormalities (Hypomagnesemia, Hypokalemia, Hypocalcemia)  -Low Magnesium initially could have triggered low K and low Ca.  Also Could be secondary to poor nutritional status for the last 3 weeks, as well as poor absorption in setting of chronic pancreatitis. Patient reports compliance with pancreatic enzyme therapy.  -hypokalemia 4.0 resolved, Mag 1.6 Hypomagnesmia resolved; Calcium still low 5.3 with ionized 0.80; she has been given Calcium gluconate overnight as well as oral Ca supplmentation 500 mg tid with meals   3.  Tachycardia -pending troponin x 1, EKG, will w/u with T4/TSH -May be secondary to decreased oral intake and illness with pneumonia   4. Acute renal Failure, resolved -May be secondary to decreased oral intake, responded to IVF -holding meds prior to admission Voltaren orally and topically  5. F/E/N  -NS 100 cc/hr  -monitoring electrolytes  -diet ordered   6. DVT Px  -  Lovenox.   Dispo: Disposition is deferred at this time, awaiting improvement of current medical problems.  Anticipated discharge in approximately 1 day(s).   The patient does have a current PCP (BHARDWAJ, SHILPA, MD), therefore  Will be requiring OPC follow-up after discharge.   The patient does not have transportation limitations that hinder transportation to clinic appointments.  .Services Needed at time of discharge: Y = Yes, Blank = No PT:   OT:   RN:   Equipment:   Other:     LOS: 1 day   [name] [name] [phone] 05/21/2012, 1:58 PM

## 2012-05-21 NOTE — Progress Notes (Signed)
INITIAL NUTRITION ASSESSMENT  DOCUMENTATION CODES Per approved criteria  -Non-severe (moderate) malnutrition in the context of acute illness or injury -Underweight   INTERVENTION:  Ensure Complete supplement daily (350 kcals, 13 gm protein per 8 fl oz bottle) RD to follow for nutrition care plan  NUTRITION DIAGNOSIS: Unintended weight loss related to unknown etiology as evidenced by 6% loss x 2 months  Goal: Oral intake with meals & supplements to meet >/= 90% of estimated nutrition needs  Monitor:  PO & supplemental intake, weight, labs, I/O's  Reason for Assessment: Malnutrition Screening Tool Report  56 y.o. female  Admitting Dx: Pneumonia  ASSESSMENT: Patient admitted with cold symptoms associated with runny nose, dry to productive cough (thick clear sputum), sweats, chills poor appetite x 3 weeks (only intake fluid); CXR showed left lower lobe pneumonia; questionable right upper lobe nodule.  Patient reports a poor appetite for the past 3 weeks; states she usually "grazes" throughout the day; reports a 5 lb weight loss since October 2013 (6%); PO intake 100% per flowsheet records; would like Ensure supplements during hospitalization -- RD to order.  Patient meets criteria for non-severe (moderate) malnutrition in the context of acute illness given < 75% intake of estimated energy requirement for > 7 days and 6% weight loss in 2 months.  Height: Ht Readings from Last 1 Encounters:  05/20/12 5\' 1"  (1.549 m)    Weight: Wt Readings from Last 1 Encounters:  05/20/12 78 lb 4.8 oz (35.517 kg)    Ideal Body Weight: 47.7 kg  % Ideal Body Weight: 73%  Wt Readings from Last 10 Encounters:  05/20/12 78 lb 4.8 oz (35.517 kg)  03/26/12 83 lb 8 oz (37.875 kg)  12/14/11 79 lb 9.6 oz (36.106 kg)  12/12/11 79 lb 9.6 oz (36.106 kg)  11/09/11 81 lb 9.6 oz (37.014 kg)  10/26/11 80 lb 1.6 oz (36.333 kg)  10/26/11 79 lb 14.4 oz (36.242 kg)  10/11/11 81 lb (36.741 kg)  09/18/11  78 lb 11.2 oz (35.698 kg)  09/06/11 83 lb 5.3 oz (37.8 kg)    Usual Body Weight: 83 lb -- October 2013  % Usual Body Weight: 94%  BMI:  Body mass index is 14.79 kg/(m^2).  Estimated Nutritional Needs: Kcal: 1100-1300 Protein: 55-65 gm Fluid: > 1.5 L  Skin: Intact  Diet Order: General  EDUCATION NEEDS: -No education needs identified at this time   Intake/Output Summary (Last 24 hours) at 05/21/12 1121 Last data filed at 05/21/12 0900  Gross per 24 hour  Intake    840 ml  Output    500 ml  Net    340 ml    Labs:   Lab 05/21/12 0426 05/20/12 2113 05/20/12 1215  NA 137 130* 131*  K 4.0 3.2* 3.1*  CL 109 103 98  CO2 14* 13* 14*  BUN 11 13 16   CREATININE 0.97 0.98 1.47*  CALCIUM 5.3* 4.5* 4.9*  MG 1.6 2.1 0.5*  PHOS -- -- --  GLUCOSE 146* 172* 134*    Scheduled Meds:   . azithromycin  500 mg Oral Q24H  . calcium carbonate  1 tablet Oral TID WC  . cefTRIAXone (ROCEPHIN)  IV  1 g Intravenous Q24H  . enoxaparin  30 mg Subcutaneous Q24H  . FLUoxetine  10 mg Oral Daily  . folic acid  1 mg Oral Daily  . gabapentin  600 mg Oral TID  . levETIRAcetam  500 mg Oral BID  . lipase/protease/amylase  1 capsule Oral TID  AC  . magnesium oxide  400 mg Oral BID  . multivitamin with minerals  1 tablet Oral Daily  . thiamine  100 mg Oral BID  . Vitamin D (Ergocalciferol)  50,000 Units Oral Q7 days    Continuous Infusions:   . sodium chloride 100 mL/hr at 05/20/12 1617    Past Medical History  Diagnosis Date  . Anemia, B12 deficiency   . History of acute pancreatitis   . Right knee pain     No recent imaging on chart  . 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 Dr. Su Hilt, [facility]  . Hypertriglyceridemia   . GERD (gastroesophageal reflux disease)   . Vitamin D deficiency   . Subdural hematoma 02/2008    Likely 2/2 trauma from seizure from EtOH withdrawal, chronic in nature, sees Dr. Robyne Askew.  Most recent CT head 10/2009 showing stable but persistent hematoma without mass effect.  . History of seizure disorder     Likely 2/2 alcohol abuse  . Hypocalcemia   . Hypomagnesemia   . Failure to thrive in childhood     Unclear etiology  . HTN (hypertension)   . Thrombocytopenia   . Anemia, macrocytic   . Hepatomegaly     On exam  . Alcohol abuse   . Joint pain   . Alcohol abuse   . Arthritis   . Vitamin D deficiency   . Menopause   . Pancreatitis   . Insomnia   . Hyperlipidemia   . Sinusitis   . Pernicious anemia   . Subdural hematoma   . Macrocytic anemia   . Hepatomegaly   . 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 Seizures     1.5 YRS  LAST ONE  . Depression   . Hepatitis     HEPATOMEGALY   . Fx humeral neck 04/17/2011    Transverse fracture- minimally displaced- managed as outpatient   . ABNORMAL PAP SMEAR, LGSIL 07/23/2008    Annotation: HPV positive CIN I Dr. Su Hilt, [facility] Qualifier: Diagnosis of  By: Danae Chen    . Pneumonia 05/20/2012    Past Surgical History  Procedure Date  . Cesarean section 1983  . Esophagogastroduodenoscopy 07/11/2011    Procedure: ESOPHAGOGASTRODUODENOSCOPY (EGD);  Surgeon: Theda Belfast, MD;  Location: [facility];  Service: Endoscopy;  Laterality: N/A;  . Colonoscopy 07/11/2011    Procedure: COLONOSCOPY;  Surgeon: Theda Belfast, MD;  Location: [facility];  Service: Endoscopy;  Laterality: N/A;  . Eye surgery     LEFT EYE YRS AGO   . Rt colectomy 08/28/2011    Kirkland Hun, RD, LDN Pager #: [phone] After-Hours Pager #: [phone]

## 2012-05-22 LAB — CBC
HCT: 20.1 % — ABNORMAL LOW (ref 36.0–46.0)
HCT: 21.7 % — ABNORMAL LOW (ref 36.0–46.0)
MCH: 37.1 pg — ABNORMAL HIGH (ref 26.0–34.0)
MCHC: 35.8 g/dL (ref 30.0–36.0)
MCV: 103.6 fL — ABNORMAL HIGH (ref 78.0–100.0)
RDW: 13.2 % (ref 11.5–15.5)
RDW: 12.8 % (ref 11.5–15.5)
WBC: 9.8 10*3/uL (ref 4.0–10.5)

## 2012-05-22 LAB — COMPREHENSIVE METABOLIC PANEL
Albumin: 1.2 g/dL — ABNORMAL LOW (ref 3.5–5.2)
BUN: 8 mg/dL (ref 6–23)
Calcium: 5.7 mg/dL — CL (ref 8.4–10.5)
Chloride: 109 mEq/L (ref 96–112)
Creatinine, Ser: 0.79 mg/dL (ref 0.50–1.10)
Total Bilirubin: 0.2 mg/dL — ABNORMAL LOW (ref 0.3–1.2)
Total Protein: 6.4 g/dL (ref 6.0–8.3)

## 2012-05-22 LAB — MAGNESIUM: Magnesium: 0.9 mg/dL — CL (ref 1.5–2.5)

## 2012-05-22 MED ORDER — IBUPROFEN 400 MG PO TABS
400.0000 mg | ORAL_TABLET | ORAL | Status: DC | PRN
  Filled 2012-05-22: qty 1

## 2012-05-22 MED ORDER — MAGNESIUM SULFATE IN D5W 10-5 MG/ML-% IV SOLN
1.0000 g | Freq: Once | INTRAVENOUS | Status: AC
  Administered 2012-05-22: 1 g via INTRAVENOUS
  Filled 2012-05-22: qty 100

## 2012-05-22 NOTE — Progress Notes (Addendum)
Subjective: Patient states she had decreased sleep last night secondary to coughing and chronic pain, back which is 8/10.  She is also having chest pain from coughing and chronic b/l side pain both 8/10.  She did not know we had her chronic pain medication ordered.    Objective: Vital signs in last 24 hours: Filed Vitals:   05/21/12 1350 05/21/12 2038 05/22/12 0230 05/22/12 0524  BP: 106/73 118/82  119/81  Pulse: 104 117 112 119  Temp: 99.1 F (37.3 C) 99.4 F (37.4 C)  99.4 F (37.4 C)  TempSrc: Oral Oral  Oral  Resp: 20 20  20   Height:      Weight:      SpO2: 95% 97%  95%   Weight change:   Intake/Output Summary (Last 24 hours) at 05/22/12 1052 Last data filed at 05/22/12 0900  Gross per 24 hour  Intake 2991.67 ml  Output      0 ml  Net 2991.67 ml   General: sitting up in bed eating breakfast, nad, alert and oriented x 3 HEENT: [place]/at CV: tachycardic, no murmurs, mild TTP chest wall  Lungs: crackles b/l bases Abdomen: soft, mild ttp LLQ, nd, normal bs  Extremities: no cyanosis or edema, warm Neuro: CN 2-12 grossly intact, moving all 4 extremities   Lab Results: Basic Metabolic Panel:  Lab 05/22/12 0865 05/21/12 0426  NA 134* 137  K 4.1 4.0  CL 109 109  CO2 16* 14*  GLUCOSE 103* 146*  BUN 8 11  CREATININE 0.79 0.97  CALCIUM 5.7* 5.3*  MG 0.9* 1.6  PHOS -- --   Liver Function Tests:  Lab 05/22/12 0510 05/20/12 1215  AST 26 35  ALT 5 7  ALKPHOS 147* 197*  BILITOT 0.2* 0.4  PROT 6.4 8.0  ALBUMIN 1.2* 1.7*   CBC:  Lab 05/22/12 0815 05/22/12 0510 05/21/12 0426 05/20/12 0909  WBC 9.8 7.9 -- --  NEUTROABS -- -- 6.2 8.9*  HGB 7.2* 7.2* -- --  HCT 21.7* 20.1* -- --  MCV 99.5 103.6* -- --  PLT 182 160 -- --   Misc. Labs: none  Micro Results: Recent Results (from the past 240 hour(s))  CULTURE, BLOOD (ROUTINE X 2)     Status: Normal (Preliminary result)   Collection Time   05/20/12  3:38 PM      Component Value Range Status Comment   Specimen  Description BLOOD HAND RIGHT   Final    Special Requests BOTTLES DRAWN AEROBIC ONLY 5CC   Final    Culture  Setup Time 05/20/2012 23:24   Final    Culture     Final    Value:        BLOOD CULTURE RECEIVED NO GROWTH TO DATE CULTURE WILL BE HELD FOR 5 DAYS BEFORE ISSUING A FINAL NEGATIVE REPORT   Report Status PENDING   Incomplete   CULTURE, BLOOD (ROUTINE X 2)     Status: Normal (Preliminary result)   Collection Time   05/20/12  3:45 PM      Component Value Range Status Comment   Specimen Description BLOOD HAND LEFT   Final    Special Requests BOTTLES DRAWN AEROBIC ONLY 5CC   Final    Culture  Setup Time 05/20/2012 23:24   Final    Culture     Final    Value:        BLOOD CULTURE RECEIVED NO GROWTH TO DATE CULTURE WILL BE HELD FOR 5 DAYS BEFORE ISSUING A FINAL  NEGATIVE REPORT   Report Status PENDING   Incomplete    Studies/Results: No results found. Medications:  Scheduled Meds:    . azithromycin  500 mg Oral Q24H  . calcium carbonate  1 tablet Oral TID WC  . cefTRIAXone (ROCEPHIN)  IV  1 g Intravenous Q24H  . enoxaparin  30 mg Subcutaneous Q24H  . feeding supplement  237 mL Oral Q1500  . FLUoxetine  10 mg Oral Daily  . folic acid  1 mg Oral Daily  . gabapentin  600 mg Oral TID  . levETIRAcetam  500 mg Oral BID  . lipase/protease/amylase  1 capsule Oral TID AC  . magnesium oxide  400 mg Oral BID  . magnesium sulfate 1 - 4 g bolus IVPB  1 g Intravenous Once  . multivitamin with minerals  1 tablet Oral Daily  . thiamine  100 mg Oral BID  . Vitamin D (Ergocalciferol)  50,000 Units Oral Q7 days   Continuous Infusions:    . sodium chloride 100 mL/hr at 05/22/12 0016   PRN Meds:.dextromethorphan, ibuprofen, LORazepam, LORazepam, oxyCODONE-acetaminophen, white petrolatum Assessment/Plan: 56 y.o with extensive PMH presenting for sepsis related to left upper lobe pneumonia with associated symptoms as mentioned per HPI.  1. *Strep Pneumonia (left upper lobe) likely CAP   -Continued Azithromycin, Rocephin (day 3) will transition to oral antibiotics today consider augmentin 875 mg bid x 4-5 more days total tx duration 7 days.  Patient is allergic to Doxy which is recommended for pt at risk prolonged QT -blood cultures NTD and pending, HIV neg.  -clinic f/u in 06/2012 with repeat CXR   2. Electrolyte abnormalities (Hypomagnesemia, Hypokalemia, Hypocalcemia)  -hypokalemia 4.0 resolved, Mag trended down again to 0.9; Calcium still low 5.7  -continue oral Ca supplmentation 500 mg tid with meals  -Running 1 gm Mag currently   3. Tachycardia -negative troponin x 1, pending repeat EKG -Thyroid labs with TSH 0.346 (low) Free T4 1.14 (normal) total T3 74.7 (low)--->nonspecific findings -May be secondary to decreased oral intake and illness with pneumonia  -outpatient repeat EKG  4. Macrocytic anemia  -Hbf 7.2 with MCV 103.6 -will d/c with folic acid -folic acid level low in 03/2012   5. History of alcohol abuse -elevated CIWA, shaky today, irritable, tachycardic so RN gave Ativan -will monitor CIWA throughout the day  6. F/E/N  -NS 100 cc/hr  -monitoring electrolytes  -diet ordered   7. DVT Px  -Lovenox  Dispo: possibly today if stable otherwise in the am   The patient does have a current PCP (Cobie Leidner, MD), therefore  Will be requiring OPC follow-up after discharge.   The patient does not have transportation limitations that hinder transportation to clinic appointments.  .Services Needed at time of discharge: Y = Yes, Blank = No PT:   OT:   RN:   Equipment:   Other:     LOS: 2 days   [name] [name] [phone] 05/22/2012, 10:52 AM --------------------------------------------------------------------------------------- Internal Medicine Teaching Service Attending Note Date: 05/22/2012  Patient name: [name] [name]  Medical record number: [id number]  Date of birth: Feb 03, 1956  PATIENT SUBJECTIVE AND OBJECTIVE The patient has not  had any fevers since the start of treatment. She feels better, however does have a bad cough for which she is on an anti-tussive. She understands that the cough will take a long time to heal and she would have to take the anti-cough medication diligently for a long time.   Vitals Filed Vitals:  05/21/12 1350 05/21/12 2038 05/22/12 0230 05/22/12 0524  BP: 106/73 118/82  119/81  Pulse: 104 117 112 119  Temp: 99.1 F (37.3 C) 99.4 F (37.4 C)  99.4 F (37.4 C)  TempSrc: Oral Oral  Oral  Resp: 20 20  20   Height:      Weight:      SpO2: 95% 97%  95%   Exam Her exam is unchanged from yesterday except that her lungs seemed less crackly.   Labs  Lab 05/22/12 0510 05/21/12 0426 05/20/12 2113 05/20/12 1215 05/20/12 1035  NA 134* 137 130* 131* 138  K 4.1 4.0 -- -- --  CL 109 109 103 98 104  CO2 16* 14* 13* 14* --  GLUCOSE 103* 146* 172* 134* 131*  BUN 8 11 13 16 15   CREATININE 0.79 0.97 0.98 1.47* 1.40*  CALCIUM 5.7* 5.3* 4.5* 4.9* --  MG 0.9* 1.6 2.1 0.5* --  PHOS -- -- -- -- --    ASSESSMENT & PLAN:  Her assessment is complex.  For pneumonia, I think she is improving. However, we were wondering what her discharge medications might be. With a prolonged QTc, we did give her Azithromycin in a controlled hospital setting and actually her QTc has decreased. However, since her strep pneumo antigen in urine is positive and she did not show anything positive for Legionella, we will send her out on a high dose Augmentin regimen. She will follow up for CXR in 6 weeks.  Her electrolyte imbalances are chronic and likely due to malnutrition and alcohol use.  She has been persistently tachycardic since admission as well as before in her clinic visits. Her Thyroid function tests are non-specefic: T3 (74.7 - low),T4(1.14 - normal),and TSH (0.346-low). This is in contrast to her previous tests which had Free T4 low and everything else was normal. She might benefit from an endocrinology consult as  outpatient. Her hypomagnesemia could be due to alcohol use. She is already supplemented for that. Her potassium is much better. Her hypocalcemia, is improved, likely due to hypoalbuminemia.   For her macrocytic anemia, again probably due to alcohol use, her folate is low, so we will increase the supplementation that she is currently on.   For her weight loss, its probably due to malnutrition. The patient met with dietician, and was started on Ensure. She is also ate most of her breakfast today.  New development - alcohol withdrawal, this morning, she was given an Ativan for an increased CIWA for tremors. I did not appreciate any tremors when I saw the patient but then she might have been under the effect of Ativan. The patient herself denies excessive use of alcohol. We will monitor CIWA to trend it.   For her persistent inappropriate asymptomatic tachycardia, which has been present chronically. We have considered hyperthyroidism (her thyroid studies are not specific for it), fever (she has been afebrile), anxiety (does not explain chronic tachycardia), sepsis (ruled out), anemia (chronic anemia causing tachycardia is less likely), and hypovolemia (despite resuscitation with normal saline she remains tachycardic). She does not have any chest pain, dizziness, or palpitations, shortness of breath. I would leave it be for now, and ask her to follow up with a medical provider if she has any of those symptoms.   [name] [name] 05/22/2012, 11:41 AM.

## 2012-05-22 NOTE — Progress Notes (Signed)
CRITICAL VALUE ALERT  Critical value received:  Ca 5.7 and Mag 0.9  Date of notification:  05/22/2012   Time of notification:  6:47 AM   Critical value read back:yes  Nurse who received alert:  Cole Eastridge, Avie Echevaria , RN  MD notified (1st page):  Dr. Garald Braver  Time of first page:  6:48 AM   MD notified (2nd page):  Time of second page:  Responding MD:    Time MD responded:

## 2012-05-23 DIAGNOSIS — R000 Tachycardia, unspecified: Secondary | ICD-10-CM | POA: Diagnosis present

## 2012-05-23 LAB — CBC
MCV: 103.7 fL — ABNORMAL HIGH (ref 78.0–100.0)
Platelets: 210 10*3/uL (ref 150–400)
RBC: 1.88 MIL/uL — ABNORMAL LOW (ref 3.87–5.11)
WBC: 8.9 10*3/uL (ref 4.0–10.5)

## 2012-05-23 LAB — COMPREHENSIVE METABOLIC PANEL
ALT: 5 U/L (ref 0–35)
AST: 25 U/L (ref 0–37)
Alkaline Phosphatase: 141 U/L — ABNORMAL HIGH (ref 39–117)
CO2: 17 mEq/L — ABNORMAL LOW (ref 19–32)
Chloride: 108 mEq/L (ref 96–112)
Creatinine, Ser: 0.77 mg/dL (ref 0.50–1.10)
GFR calc non Af Amer: >90 mL/min (ref >90)
Sodium: 133 mEq/L — ABNORMAL LOW (ref 135–145)
Total Bilirubin: 0.2 mg/dL — ABNORMAL LOW (ref 0.3–1.2)

## 2012-05-23 MED ORDER — PANTOPRAZOLE SODIUM 40 MG PO TBEC
40.0000 mg | DELAYED_RELEASE_TABLET | Freq: Once | ORAL | Status: AC
  Administered 2012-05-23: 40 mg via ORAL
  Filled 2012-05-23: qty 1

## 2012-05-23 MED ORDER — RANITIDINE HCL 150 MG PO TABS: 150.0000 mg | ORAL_TABLET | Freq: Two times a day (BID) | ORAL | Status: DC

## 2012-05-23 MED ORDER — MAGNESIUM SULFATE IN D5W 10-5 MG/ML-% IV SOLN
1.0000 g | Freq: Once | INTRAVENOUS | Status: AC
  Administered 2012-05-23: 1 g via INTRAVENOUS
  Filled 2012-05-23: qty 100

## 2012-05-23 MED ORDER — CALCIUM CARBONATE 1250 (500 CA) MG PO TABS: 1.0000 | ORAL_TABLET | Freq: Three times a day (TID) | ORAL | Status: DC

## 2012-05-23 MED ORDER — AMOXICILLIN-POT CLAVULANATE 875-125 MG PO TABS: 1.0000 | ORAL_TABLET | Freq: Two times a day (BID) | ORAL | Status: DC

## 2012-05-23 MED ORDER — DEXTROMETHORPHAN POLISTIREX 30 MG/5ML PO LQCR: 15.0000 mg | ORAL | Status: DC | PRN

## 2012-05-23 NOTE — Progress Notes (Signed)
Awaiting d/c home. Patient is to have Iv Rocephin prior to d/c home. IV non patent. IV team to insert IV. [name] [name]

## 2012-05-23 NOTE — Progress Notes (Signed)
Dr. Shirlee Latch informed of HB 6.8. Order given for hemocult stool; complete. IF hemocult okay will continue with d/c home. [name] [name]

## 2012-05-23 NOTE — Discharge Summary (Signed)
Internal Medicine Teaching Service Attending Note Date: 05/23/2012  Patient name: [name] [name]  Medical record number: [id number]  Date of birth: 1955/10/03    I evaluated the patient on the day of discharge and discussed the discharge plan with my resident team.  She is doing much better and has feels good. She has residual cough which I told her would take some time in receeding completely.  Vitals Filed Vitals:   05/23/12 0434  BP: 114/82  Pulse: 117  Temp: 98.3 F (36.8 C)  Resp: 17   Exam  General: Resting in bed. HEENT: PERRL, EOMI, no scleral icterus. Heart: RRR, no rubs, murmurs or gallops. Lungs: fine scattered rales. Abdomen: Soft, nontender, nondistended, BS present. Extremities: Warm, no pedal edema. Neuro: Alert and oriented X3, cranial nerves II-XII grossly intact,  strength and sensation to light touch equal in bilateral upper and lower extremities    Labs reviewed as mentioned in resident note.  Ok to discharge. In stable condition. Will follow up in [facility] with [place]. I agree with the discharge documentation and disposition.   Thanks Aletta Edouard 05/23/2012, 1:06 PM

## 2012-05-23 NOTE — Progress Notes (Signed)
Subjective: Patient is feeling fine.  Improved sleep overnight.  She has lost weight over the last 3 weeks.  She normally weighs 98 lbs.  She asks about something to help her gain weight.   Objective: Vital signs in last 24 hours: Filed Vitals:   05/22/12 0524 05/22/12 1350 05/22/12 2044 05/23/12 0434  BP: 119/81 112/79 133/90 114/82  Pulse: 119 102 116 117  Temp: 99.4 F (37.4 C) 97.7 F (36.5 C) 98.6 F (37 C) 98.3 F (36.8 C)  TempSrc: Oral Oral Oral Oral  Resp: 20 17 18 17   Height:      Weight:      SpO2: 95% 92% 95% 92%   Weight change:   Intake/Output Summary (Last 24 hours) at 05/23/12 0855 Last data filed at 05/22/12 2046  Gross per 24 hour  Intake    720 ml  Output      4 ml  Net    716 ml   General: awake, nad, alert and oriented x 3 HEENT: [place]/at CV: tachycardic, no murmurs Lungs: mild crackles b/l lung fields Abdomen: soft, ntnd, normal bs  Extremities: no cyanosis or edema, warm Neuro: CN 2-12 grossly intact, moving all 4 extremities   Lab Results: Basic Metabolic Panel:  Lab 05/23/12 1610 05/22/12 2027 05/22/12 0510  NA 133* -- 134*  K 4.3 -- 4.1  CL 108 -- 109  CO2 17* -- 16*  GLUCOSE 97 -- 103*  BUN 7 -- 8  CREATININE 0.77 -- 0.79  CALCIUM 5.9* -- 5.7*  MG 1.4* 1.1* --  PHOS -- -- --   Liver Function Tests:  Lab 05/23/12 0600 05/22/12 0510  AST 25 26  ALT 5 5  ALKPHOS 141* 147*  BILITOT 0.2* 0.2*  PROT 6.2 6.4  ALBUMIN 1.2* 1.2*   CBC:  Lab 05/23/12 0600 05/22/12 0815 05/21/12 0426 05/20/12 0909  WBC 8.9 9.8 -- --  NEUTROABS -- -- 6.2 8.9*  HGB 6.8* 7.2* -- --  HCT 19.5* 21.7* -- --  MCV 103.7* 99.5 -- --  PLT 210 182 -- --   Misc. Labs: none  Micro Results: Recent Results (from the past 240 hour(s))  CULTURE, BLOOD (ROUTINE X 2)     Status: Normal (Preliminary result)   Collection Time   05/20/12  3:38 PM      Component Value Range Status Comment   Specimen Description BLOOD HAND RIGHT   Final    Special Requests BOTTLES  DRAWN AEROBIC ONLY 5CC   Final    Culture  Setup Time 05/20/2012 23:24   Final    Culture     Final    Value:        BLOOD CULTURE RECEIVED NO GROWTH TO DATE CULTURE WILL BE HELD FOR 5 DAYS BEFORE ISSUING A FINAL NEGATIVE REPORT   Report Status PENDING   Incomplete   CULTURE, BLOOD (ROUTINE X 2)     Status: Normal (Preliminary result)   Collection Time   05/20/12  3:45 PM      Component Value Range Status Comment   Specimen Description BLOOD HAND LEFT   Final    Special Requests BOTTLES DRAWN AEROBIC ONLY 5CC   Final    Culture  Setup Time 05/20/2012 23:24   Final    Culture     Final    Value:        BLOOD CULTURE RECEIVED NO GROWTH TO DATE CULTURE WILL BE HELD FOR 5 DAYS BEFORE ISSUING A FINAL NEGATIVE REPORT  Report Status PENDING   Incomplete    Studies/Results: No results found. Medications:  Scheduled Meds:    . azithromycin  500 mg Oral Q24H  . calcium carbonate  1 tablet Oral TID WC  . cefTRIAXone (ROCEPHIN)  IV  1 g Intravenous Q24H  . enoxaparin  30 mg Subcutaneous Q24H  . feeding supplement  237 mL Oral Q1500  . FLUoxetine  10 mg Oral Daily  . folic acid  1 mg Oral Daily  . gabapentin  600 mg Oral TID  . levETIRAcetam  500 mg Oral BID  . lipase/protease/amylase  1 capsule Oral TID AC  . magnesium oxide  400 mg Oral BID  . multivitamin with minerals  1 tablet Oral Daily  . pantoprazole  40 mg Oral Once  . thiamine  100 mg Oral BID  . Vitamin D (Ergocalciferol)  50,000 Units Oral Q7 days   Continuous Infusions:    . sodium chloride 100 mL/hr at 05/22/12 0016   PRN Meds:.dextromethorphan, ibuprofen, LORazepam, LORazepam, oxyCODONE-acetaminophen, white petrolatum Assessment/Plan: 56 y.o with extensive PMH presenting for sepsis related to left upper lobe pneumonia with associated symptoms as mentioned per HPI.  1. *Strep Pneumonia (left upper lobe) likely CAP  -Continued Azithromycin, Rocephin (day 4) will transition to oral antibiotics augmentin 875 mg bid x 3  more days total tx duration 7 days. -blood cultures NTD and pending, HIV neg.  -clinic f/u in 06/2012 with repeat CXR   2. Electrolyte abnormalities (Hypomagnesemia, Hypokalemia, Hypocalcemia)  -hypokalemia 4.3 resolved, Mag 1.1; Calcium still low 5.9 but corrected Ca range 7.74-9.1 which is normal.   -continue oral Ca supplmentation 500 mg tid with meals  -given Mag 1 g overnight and Mag this am is 1.4.  Will continued Mag home dose.    3. Tachycardia -This is chronic.  Thyroid studies nonspecific.  This is still persistent despite IVF.  She is asymptomatic.  She will f/u with [facility]  4. Macrocytic anemia probably secondary to folate deficiency -Hbg 6.8 with MCV 103.7  -will fobt today, pending result  5. Weight loss -Unsure etiology -She had a mammogram in 03/2012 which was negative.  She had an upper endoscopy and colonoscopy 07/2011 which showed gastritis, duodenitis, colonic mass which was biopsied and negative for malignancy -Encouraged to drink Ensure/Boost supplementation  6. F/E/N  -NS 100 cc/hr  -monitoring electrolytes  -diet ordered   7. DVT Px  -Lovenox  Dispo: d/c today  The patient does have a current PCP (BHARDWAJ, SHILPA, MD), therefore  Will be requiring OPC follow-up after discharge.   The patient does not have transportation limitations that hinder transportation to clinic appointments.  .Services Needed at time of discharge: Y = Yes, Blank = No PT:   OT:   RN:   Equipment:   Other:     LOS: 3 days   Annett Gula [phone] 05/23/2012, 8:55 AM

## 2012-05-23 NOTE — Discharge Summary (Signed)
Internal Medicine Teaching [facility] Discharge Note  Name: [name] [name] MRN: [id number] DOB: Sep 29, 1955 56 y.o.  Date of Admission: 05/20/2012  8:25 AM Date of Discharge: 05/23/2012 Attending Physician: Aletta Edouard, MD  Discharge Diagnosis: 1. *Sepsis secondary to Strep Pneumonia (left upper lobe) likely CAP  2. Electrolyte abnormalities (Hypomagnesemia, Hypokalemia, Hypocalcemia)  3. Tachycardia, chronic  4. 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 Weight loss  6. History of alcohol abuse 7. Resolved acute renal failure   8. History of duodenitis/gastritis  Discharge Medications:   Medication List     As of 05/23/2012  9:12 AM    STOP taking these medications         calcium gluconate 500 MG tablet      TAKE these medications         amLODipine 10 MG tablet   Commonly known as: NORVASC   Take 10 mg by mouth daily.      amoxicillin-clavulanate 875-125 MG per tablet   Commonly known as: AUGMENTIN   Take 1 tablet by mouth 2 (two) times daily.      amylase-lipase-protease 20-4.5-25 MU per capsule   Commonly known as: PANGESTYME EC   Take 1 capsule by mouth 3 (three) times daily with meals.      calcium carbonate 1250 MG tablet   Commonly known as: OS-CAL - dosed in mg of elemental calcium   Take 1 tablet (500 mg of elemental calcium total) by mouth 3 (three) times daily.      cyanocobalamin 1000 MCG/ML injection   Commonly known as: (VITAMIN B-12)   Inject 1,000 mcg into the muscle every 30 (thirty) days. Last day of month. Last dose last month      dextromethorphan 30 MG/5ML liquid   Commonly known as: DELSYM   Take 2.5 mLs (15 mg total) by mouth every 4 (four) hours as needed for cough.      diclofenac 75 MG EC tablet   Commonly known as: VOLTAREN   Take 75 mg by mouth 2 (two) times daily.      diclofenac sodium 1 % Gel   Commonly known as: VOLTAREN   Apply 2 g topically 4 (four) times daily as needed.      FLUoxetine  10 MG capsule   Commonly known as: PROZAC   Take 10 mg by mouth daily.      folic acid 1 MG tablet   Commonly known as: FOLVITE   Take 1 tablet (1 mg total) by mouth daily.      gabapentin 300 MG capsule   Commonly known as: NEURONTIN   Take 2 capsules (600 mg total) by mouth 3 (three) times daily.      hydrochlorothiazide 25 MG tablet   Commonly known as: HYDRODIURIL   Take 25 mg by mouth daily.      ibuprofen 200 MG tablet   Commonly known as: ADVIL,MOTRIN   Take 200 mg by mouth every 6 (six) hours as needed. For pain      levETIRAcetam 500 MG tablet   Commonly known as: KEPPRA   Take 500 mg by mouth 2 (two) times daily.      magnesium oxide 400 MG tablet   Commonly known as: MAG-OX   Take 400 mg by mouth 2 (two) times daily.      multivitamin with minerals Tabs   Take 1 tablet by mouth daily.      oxyCODONE-acetaminophen 5-325 MG per tablet   Commonly known as:  PERCOCET/ROXICET   Take 1 tablet by mouth every 4 (four) hours as needed. For pain      promethazine 12.5 MG tablet   Commonly known as: PHENERGAN   Take 12.5 mg by mouth every 6 (six) hours as needed. For nausea      ranitidine 150 MG tablet   Commonly known as: ZANTAC   Take 1 tablet (150 mg total) by mouth 2 (two) times daily.      thiamine 100 MG tablet   Commonly known as: VITAMIN B-1   Take 100 mg by mouth 2 (two) times daily.      Vitamin D (Ergocalciferol) 50000 UNITS Caps   Commonly known as: DRISDOL   Take 50,000 Units by mouth every 7 (seven) days. Take on Thursdays.         Disposition and follow-up:   [name] [name] was discharged from [facility] in stable condition.  At the hospital follow up visit please address  1) CBC, BMET, Magnesium 2) Weight loss work up 3) Tachycardia work up 4) Consider repeat CXR for resolution of pneumonia  5) Follow up blood cultures 6) Review NSAID use home medications and discuss with patient if she needs medications (she had  renal failure this admission) 7) Of note she is not supposed to get narcotics from our clinic   Follow-up Appointments:  Discharge Orders    Future Appointments: Provider: Department: Dept Phone: Center:   05/31/2012 10:00 AM [facility] [phone] [facility]   06/07/2012 1:15 PM Lorretta Harp, MD [facility] [phone] [facility]     Future Orders Please Complete By Expires   Basic Metabolic Panel (BMET)  05/31/12 05/23/13   Questions: Responses:   Has the patient fasted?    Magnesium  05/31/12 05/23/13   CBC w/Diff  05/31/12 05/23/13   Increase activity slowly      Discharge instructions      Comments:   1)Please pick up your prescriptions from [facility] 2) Please go to the Internal Medicine Clinic 05/31/12 at 10 am for blood work 3) Please go to the Internal Medicine Clinic 06/08/11 for hospital follow up appointment at 1:15 PM [phone] 4)Drink Ensure or Boost at home  5) read through the information given      Consultations: none Procedures Performed:  Dg Chest 2 View  05/20/2012  *RADIOLOGY REPORT*  Clinical Data: Cough with chest pain.  CHEST - 2 VIEW  Comparison: 12/21/2010 and 10/23/2009.  Findings: The heart size and mediastinal contours are stable.  The lungs are hyperinflated.  There is new left lower lobe air space disease with resulting increased density over the lower thoracic spine on the lateral view.  This finding is suspicious for pneumonia.  There is no focal airspace disease in the right lung. However, there is a questionable new nodular density at the confluence of the right second and fifth ribs on the frontal examination.  There is no pleural effusion.  Calcifications are present within the upper abdomen consistent with chronic calcific pancreatitis.  There is deformity of the right humeral neck consistent with an age indeterminate fracture.  IMPRESSION:  1.  Left lower lobe pneumonia.  This should be followed to  radiographic clearing to exclude other etiologies. 2.  Questionable right upper lobe nodule.  Attention to this area on radiographic followup is recommended.  If persistent, this may require CT for complete evaluation. 3.  Calcific pancreatitis.   Original Report Authenticated By: Chrissie Noa  Purcell Mouton, M.D.    Dg Knee Complete 4 Views Left  05/06/2012  *RADIOLOGY REPORT*  Clinical Data: Fall yesterday, left knee pain  LEFT KNEE - COMPLETE 4+ VIEW  Comparison: 01/27/2009  Findings: Four views of the left knee submitted.  No acute fracture or subluxation.  There is diffuse osteopenia.  Small joint effusion.  Mild narrowing of the medial joint compartment.  Minimal spurring of the lateral femoral condyle.  Narrowing of patellofemoral joint space.  IMPRESSION: No acute fracture or subluxation.  Diffuse osteopenia.  Mild degenerative changes as described above.  Small joint effusion.   Original Report Authenticated By: Natasha Mead, M.D.     Admission HPI:  Chief Complaint: cough  History of Present Illness:  Cold symptoms associated with runny nose, dry to productive cough (thick clear sputum), sweats, chills poor appetite x 3 weeks (only intake fluid). CXR showed left lower lobe pneumonia. Questionable right upper lobe nodule. She has had a flu shot this year. ROS: +weight loss, denies sick contacts, denies fevers, denies dysphagia, mild chest pain associated with coughing, +palpitations, denies sob, denies [name] pain, denies constipation/diarrhea, denies blood in urine or stool, denies dysuria, denies swelling lower extremities, denies muscle aches, denies skin breakdown.   Review of Systems:  General: +weight loss, denies sick contacts, denies fevers  HEENT: denies dysphagia  CV: mild chest pain associated with coughing, +palpitations  Lungs: denies sob  Abdomen/GU: denies [name] pain, denies constipation/diarrhea, denies blood in urine or stool, denies dysuria  Extremities: denies swelling lower extremities  MSK:  denies muscle aches  Skin: denies skin breakdown.   Physical Exam:  Blood pressure 111/81, pulse 109, temperature 99.5 F (37.5 C), temperature source Oral, resp. rate 18, height 5\' 1"  (1.549 m), weight 78 lb 4.8 oz (35.517 kg), SpO2 97.00%.  General: cachetic, lying in bed nad, alert and oriented x 3  HEENT: /at, wet oral mucosa  CV: tachycardic, no murmurs  Lungs: min wheezes and crackle bibasilar left >right  Abdomen: soft, hypoactive bs, ntnd  Extremities: warm, no cyanosis or edema  Neuro: intact, CN 2-12 grossly intact  Hospital Course by problem list: 1. *Sepsis secondary to Strep Pneumonia (left upper lobe) likely CAP  2. Electrolyte abnormalities (Hypomagnesemia, Hypokalemia, Hypocalcemia)  3. Tachycardia, chronic  4. 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 Weight loss  6. History of alcohol abuse 7. Resolved acute renal failure   8. History of duodenitis/gastritis   1. *Sepsis secondary to Strep Pneumonia (left upper lobe) likely CAP  56 y.o with extensive past medical history presenting for sepsis related to left upper lobe pneumonia.  This was associated with cough, leukocytosis, tachycardia, mild tachypnea on admission.  She was given Azithromycin and Rocephin for 4 days to to continue  Augmentin 875-125 mg bid x 3 more days total duration 7 days. She was given cough syrup as well this admission.  She was hydrated with normal saline 100 cc/hr this admission. Her blood cultures were negative and pending.  HIV was negative.  Repeat CXR in the future for resolution.     2. Electrolyte abnormalities (Hypomagnesemia, Hypokalemia, Hypocalcemia)  She had not been having adequate oral intake for 2-3 weeks prior to admission and appears malnourished.  She was given calcium gluconate this admission due to critical lab values.  She was given intravenous Magnesium this admission due to critical lab values.  She is on home Calcium and Magnesium.  Her home Calcium  dose 500 daily was increased to 500 mg  three times a day.  On day of discharge her hypokalemia 4.3 resolved, Magnesium was 1.1 and she had been given Magnesium 1 g overnight which brought her Magnesium up to 1.4. Will continue Magnesium home dose.  Her Calcium was still low 5.9, likely secondary to low albumin but corrected Calcium range 7.74-9.1 which is normal.  We will continue oral Ca supplmentation 500 mg tid with meals.  She will have a BMET, Magnesium in 1 week.    3. Tachycardia, chronic  EKG this admission showed sinus tachycardia.  This is chronic. Thyroid studies nonspecific. This is still persistent despite intravenous fluids. She is asymptomatic. This should be worked up outpatient.  She had one negative troponin this admission.    4. 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  This is trending down.  She has noted folate deficiency in 03/2012.  We did a fecal occult blood test this admission.  She will have a CBC in 1 week.    5. Weight loss  Unsure etiology.  She had decreased oral intake for 2-3 weeks prior to admission.  She had a mammogram in 03/2012 which was negative. She had an upper endoscopy and colonoscopy 07/2011 which showed gastritis, duodenitis, colonic mass which was biopsied and negative for malignancy.  We encouraged her to drink Ensure/Boost supplementation at home.    6. History of alcohol abuse She was placed on CIWA protocol this admission.  She drinks a glass of wine daily  7. Resolved acute renal failure Creatinine was 1.47 with a Creatinine of 0.78 noted in 03/2012.  Likely secondary to decreased oral intake prior to admission which responded to fluids.  We also held her Voltarin 75 mg bid, Ibuprofen 200 mg q6 as needed this admission.  She is also using Voltaren gel at home.  Medications need to be reevaluated outpatient.    8. History of duodenitis/gastritis She was started on Protonix  40 mg daily on day of discharge to continue Ranitidine 150 mg bid at discharge.    She was given Lovenox for DVT prophylaxis     Discharge Vitals:  BP 114/82  Pulse 117  Temp 98.3 F (36.8 C) (Oral)  Resp 17  Ht 5\' 1"  (1.549 m)  Wt 78 lb 4.8 oz (35.517 kg)  BMI 14.79 kg/m2  SpO2 92% Discharge physical exam: General: awake, nad, alert and oriented x 3  HEENT: [place]/at  CV: tachycardic, no murmurs  Lungs: mild crackles b/l lung fields  Abdomen: soft, ntnd, normal bs  Extremities: no cyanosis or edema, warm  Neuro: CN 2-12 grossly intact, moving all 4 extremities   Discharge Labs:  Results for [name], [name] (MRN [id number]) as of 05/23/2012 08:57  Ref. Range 05/21/2012 04:24 05/21/2012 04:26 05/22/2012 05:10 05/22/2012 20:27 05/23/2012 06:00  Sodium Latest Range: 135-145 mEq/L  137 134 (L)  133 (L)  Potassium Latest Range: 3.5-5.1 mEq/L  4.0 4.1  4.3  Chloride Latest Range: 96-112 mEq/L  109 109  108  CO2 Latest Range: 19-32 mEq/L  14 (L) 16 (L)  17 (L)  BUN Latest Range: 6-23 mg/dL  11 8  7   Creatinine Latest Range: 0.50-1.10 mg/dL  4.09 8.11  9.14  Calcium Latest Range: 8.4-10.5 mg/dL  5.3 (LL) 5.7 (LL)  5.9 (LL)  GFR calc non Af Amer Latest Range: >90 mL/min  65 (L) >90  >90  GFR calc Af Amer Latest Range: >90 mL/min  75 (L) >  90  >90  Glucose Latest Range: 70-99 mg/dL  161 (H) 096 (H)  97  Calcium Ionized Latest Range: 1.12-1.32 mmol/L 0.80 (L)      Magnesium Latest Range: 1.5-2.5 mg/dL  1.6 0.9 (LL) 1.1 (L) 1.4 (L)  Alkaline Phosphatase Latest Range: 39-117 U/L   147 (H)  141 (H)  Albumin Latest Range: 3.5-5.2 g/dL   1.2 (L)  1.2 (L)  AST Latest Range: 0-37 U/L   26  25  ALT Latest Range: 0-35 U/L   5  5  Total Protein Latest Range: 6.0-8.3 g/dL   6.4  6.2  Total Bilirubin Latest Range: 0.3-1.2 mg/dL   0.2 (L)  0.2 (L)   Results for [name], [name] (MRN [id number]) as of 05/23/2012 08:57  Ref. Range 05/20/2012 10:35 05/20/2012 12:15 05/20/2012 21:13  Sodium Latest  Range: 135-145 mEq/L 138 131 (L) 130 (L)  Potassium Latest Range: 3.5-5.1 mEq/L 2.9 (L) 3.1 (L) 3.2 (L)  Chloride Latest Range: 96-112 mEq/L 104 98 103  CO2 Latest Range: 19-32 mEq/L  14 (L) 13 (L)  BUN Latest Range: 6-23 mg/dL 15 16 13   Creatinine Latest Range: 0.50-1.10 mg/dL 9.14 (H) 7.82 (H) 9.56  Calcium Latest Range: 8.4-10.5 mg/dL  4.9 (LL) 4.5 (LL)  GFR calc non Af Amer Latest Range: >90 mL/min  39 (L) 64 (L)  GFR calc Af Amer Latest Range: >90 mL/min  45 (L) 74 (L)  Glucose Latest Range: 70-99 mg/dL 213 (H) 086 (H) 578 (H)  Calcium Ionized Latest Range: 1.12-1.32 mmol/L 0.65 (L)    Magnesium Latest Range: 1.5-2.5 mg/dL  0.5 (LL) 2.1  Alkaline Phosphatase Latest Range: 39-117 U/L  197 (H)   Albumin Latest Range: 3.5-5.2 g/dL  1.7 (L)   AST Latest Range: 0-37 U/L  35   ALT Latest Range: 0-35 U/L  7   Total Protein Latest Range: 6.0-8.3 g/dL  8.0   Total Bilirubin Latest Range: 0.3-1.2 mg/dL  0.4    Results for [name], SMART (MRN [id number]) as of 05/23/2012 08:57  Ref. Range 05/21/2012 15:16  Troponin I Latest Range: <0.30 ng/mL <0.30   Results for [name], [name] (MRN [id number]) as of 05/23/2012 08:57  Ref. Range 05/20/2012 09:09 05/20/2012 10:35 05/21/2012 04:26 05/22/2012 05:10 05/22/2012 08:15 05/23/2012 06:00  WBC Latest Range: 4.0-10.5 K/uL 13.8 (H)  10.4 7.9 9.8 8.9  RBC Latest Range: 3.87-5.11 MIL/uL 2.27 (L)  2.02 (L) 1.94 (L) 2.18 (L) 1.88 (L)  Hemoglobin Latest Range: 12.0-15.0 g/dL 8.8 (L) 8.2 (L) 7.4 (L) 7.2 (L) 7.2 (L) 6.8 (LL)  HCT Latest Range: 36.0-46.0 % 23.9 (L) 24.0 (L) 20.6 (L) 20.1 (L) 21.7 (L) 19.5 (L)  MCV Latest Range: 78.0-100.0 fL 105.3 (H)  102.0 (H) 103.6 (H) 99.5 103.7 (H)  MCH Latest Range: 26.0-34.0 pg 38.8 (H)  36.6 (H) 37.1 (H) 33.0 36.2 (H)  MCHC Latest Range: 30.0-36.0 g/dL 27.2 (H)  53.6 64.4 03.4 34.9  RDW Latest Range: 11.5-15.5 % 13.8  13.0 13.2 12.8 13.1  Platelets Latest Range: 150-400 K/uL 136 (L)  138 (L) 160 182 210   Neutrophils Relative Latest Range: 43-77 % 65  60     Lymphocytes Relative Latest Range: 12-46 % 15  20     Monocytes Relative Latest Range: 3-12 % 20 (H)  19 (H)     Eosinophils Relative Latest Range: 0-5 % 0  1     Basophils Relative Latest Range: 0-1 % 0  0     NEUT# Latest Range: 1.7-7.7  K/uL 8.9 (H)  6.2     Lymphocytes Absolute Latest Range: 0.7-4.0 K/uL 2.1  2.1     Monocytes Absolute Latest Range: 0.1-1.0 K/uL 2.8 (H)  2.0 (H)     Eosinophils Absolute Latest Range: 0.0-0.5 10e3/uL 0.0  0.1     Basophils Absolute Latest Range: 0.0-0.1 K/uL 0.0  0.0     WBC Morphology No range found ATYPICAL LYMPHOCYTES        Results for [name], [name] (MRN [id number]) as of 05/23/2012 08:57  Ref. Range 05/20/2012 10:35 05/20/2012 12:15 05/20/2012 21:13 05/21/2012 04:26 05/21/2012 12:32 05/21/2012 15:17 05/22/2012 05:10 05/23/2012 06:00  Glucose Latest Range: 70-99 mg/dL 782 (H) 956 (H) 213 (H) 146 (H)   103 (H) 97  TSH Latest Range: 0.350-4.500 uIU/mL     0.346 (L)     Free T4 Latest Range: 0.80-1.80 ng/dL     0.86     T3, Total Latest Range: 80.0-204.0 ng/dl      57.8 (L)     Results for [name], [name] (MRN [id number]) as of 05/23/2012 08:57  Ref. Range 05/20/2012 15:30  HIV Latest Range: NON REACTIVE  NON REACTIVE   Results for [name], [name] (MRN [id number]) as of 05/23/2012 08:57  Ref. Range 05/20/2012 16:47  Legionella Antigen, Urine No range found Negative for Legionella pneumophilia serogroup 1  Strep Pneumo Urinary Antigen Latest Range: NEGATIVE  POSITIVE (A)    Results for orders placed during the hospital encounter of 05/20/12 (from the past 24 hour(s))  MAGNESIUM     Status: Abnormal   Collection Time   05/22/12  8:27 PM      Component Value Range   Magnesium 1.1 (*) 1.5 - 2.5 mg/dL  CBC     Status: Abnormal   Collection Time   05/23/12  6:00 AM      Component Value Range   WBC 8.9  4.0 - 10.5 K/uL   RBC 1.88 (*) 3.87 - 5.11 MIL/uL   Hemoglobin 6.8 (*) 12.0 -  15.0 g/dL   HCT 27.2 (*) 53.6 - 64.4 %   MCV 103.7 (*) 78.0 - 100.0 fL   MCH 36.2 (*) 26.0 - 34.0 pg   MCHC 34.9  30.0 - 36.0 g/dL   RDW 03.4  74.2 - 59.5 %   Platelets 210  150 - 400 K/uL  COMPREHENSIVE METABOLIC PANEL     Status: Abnormal   Collection Time   05/23/12  6:00 AM      Component Value Range   Sodium 133 (*) 135 - 145 mEq/L   Potassium 4.3  3.5 - 5.1 mEq/L   Chloride 108  96 - 112 mEq/L   CO2 17 (*) 19 - 32 mEq/L   Glucose, Bld 97  70 - 99 mg/dL   BUN 7  6 - 23 mg/dL   Creatinine, Ser 6.38  0.50 - 1.10 mg/dL   Calcium 5.9 (*) 8.4 - 10.5 mg/dL   Total Protein 6.2  6.0 - 8.3 g/dL   Albumin 1.2 (*) 3.5 - 5.2 g/dL   AST 25  0 - 37 U/L   ALT 5  0 - 35 U/L   Alkaline Phosphatase 141 (*) 39 - 117 U/L   Total Bilirubin 0.2 (*) 0.3 - 1.2 mg/dL   GFR calc non Af Amer >90  >90 mL/min   GFR calc Af Amer >90  >90 mL/min  MAGNESIUM     Status: Abnormal   Collection Time   05/23/12  6:00 AM  Component Value Range   Magnesium 1.4 (*) 1.5 - 2.5 mg/dL    Signed: Annett Gula 05/23/2012, 9:12 AM   Time Spent on Discharge: 30 minutes  Services Ordered on Discharge: none Equipment Ordered on Discharge: none

## 2012-05-24 ENCOUNTER — Other Ambulatory Visit: Payer: Self-pay | Admitting: Internal Medicine

## 2012-05-26 LAB — CULTURE, BLOOD (ROUTINE X 2)

## 2012-05-31 ENCOUNTER — Telehealth: Payer: Self-pay | Admitting: Internal Medicine

## 2012-05-31 ENCOUNTER — Other Ambulatory Visit: Payer: Self-pay | Admitting: Internal Medicine

## 2012-05-31 ENCOUNTER — Other Ambulatory Visit (INDEPENDENT_AMBULATORY_CARE_PROVIDER_SITE_OTHER): Payer: PRIVATE HEALTH INSURANCE

## 2012-05-31 DIAGNOSIS — J154 Pneumonia due to other streptococci: Secondary | ICD-10-CM

## 2012-05-31 LAB — CBC WITH DIFFERENTIAL/PLATELET
Basophils Absolute: 0.1 10*3/uL (ref 0.0–0.1)
Basophils Relative: 2 % — ABNORMAL HIGH (ref 0–1)
Eosinophils Absolute: 0.1 10*3/uL (ref 0.0–0.7)
Eosinophils Relative: 1 % (ref 0–5)
Lymphocytes Relative: 52 % — ABNORMAL HIGH (ref 12–46)
MCH: 36 pg — ABNORMAL HIGH (ref 26.0–34.0)
MCHC: 34.1 g/dL (ref 30.0–36.0)
MCV: 105.4 fL — ABNORMAL HIGH (ref 78.0–100.0)
Platelets: 364 10*3/uL (ref 150–400)
RDW: 14.2 % (ref 11.5–15.5)
WBC: 5.7 10*3/uL (ref 4.0–10.5)

## 2012-05-31 LAB — BASIC METABOLIC PANEL WITH GFR
BUN: 7 mg/dL (ref 6–23)
Calcium: 6.6 mg/dL — ABNORMAL LOW (ref 8.4–10.5)
Creat: 0.68 mg/dL (ref 0.50–1.10)
GFR, Est African American: >89 mL/min
GFR, Est Non African American: >89 mL/min

## 2012-05-31 LAB — MAGNESIUM: Magnesium: 0.8 mg/dL — CL (ref 1.5–2.5)

## 2012-05-31 MED ORDER — FOLIC ACID 1 MG PO TABS: 1.0000 mg | ORAL_TABLET | Freq: Every day | ORAL | Status: DC

## 2012-05-31 MED ORDER — VITAMIN B-1 100 MG PO TABS: 100.0000 mg | ORAL_TABLET | Freq: Every day | ORAL | Status: DC

## 2012-05-31 MED ORDER — MAGNESIUM OXIDE 400 MG PO TABS: 800.0000 mg | ORAL_TABLET | Freq: Two times a day (BID) | ORAL | Status: DC

## 2012-05-31 MED ORDER — VITAMIN D (ERGOCALCIFEROL) 1.25 MG (50000 UNIT) PO CAPS: 50000.0000 [IU] | ORAL_CAPSULE | ORAL | Status: DC

## 2012-05-31 NOTE — Telephone Encounter (Signed)
Spoke with [name] [name] [phone] about critical lab value of Magnesium 0.8.  She is taking Magnesium 500 bid though it is documented 400 mg bid in our computer system.  Will increase her to 800 mg bid.  She is not having diarrhea currently but advised her of this side effect.  Low Mag can cause tachycardia, muscles weakness, confusion, muscle cramps.  Advised her to come to the ED if she experiencing these symptoms.  She reported she is having mild muscle cramps. She will increase her Magnesium to 800 mg bid.    Shirlee Latch MD

## 2012-06-02 ENCOUNTER — Other Ambulatory Visit: Payer: Self-pay

## 2012-06-02 ENCOUNTER — Telehealth: Payer: Self-pay | Admitting: Internal Medicine

## 2012-06-02 ENCOUNTER — Emergency Department (HOSPITAL_COMMUNITY)
Admission: EM | Admit: 2012-06-02 | Discharge: 2012-06-03 | Disposition: A | Discharge status: Home / Self Care | Payer: PRIVATE HEALTH INSURANCE | Attending: Emergency Medicine | Admitting: Emergency Medicine

## 2012-06-02 ENCOUNTER — Encounter (HOSPITAL_COMMUNITY): Payer: Self-pay | Admitting: *Deleted

## 2012-06-02 DIAGNOSIS — Z862 Personal history of diseases of the blood and blood-forming organs and certain disorders involving the immune mechanism: Secondary | ICD-10-CM | POA: Insufficient documentation

## 2012-06-02 DIAGNOSIS — R197 Diarrhea, unspecified: Secondary | ICD-10-CM | POA: Insufficient documentation

## 2012-06-02 DIAGNOSIS — Z8669 Personal history of other diseases of the nervous system and sense organs: Secondary | ICD-10-CM | POA: Insufficient documentation

## 2012-06-02 DIAGNOSIS — Z8701 Personal history of pneumonia (recurrent): Secondary | ICD-10-CM | POA: Insufficient documentation

## 2012-06-02 DIAGNOSIS — I10 Essential (primary) hypertension: Secondary | ICD-10-CM | POA: Insufficient documentation

## 2012-06-02 DIAGNOSIS — F329 Major depressive disorder, single episode, unspecified: Secondary | ICD-10-CM | POA: Insufficient documentation

## 2012-06-02 DIAGNOSIS — Z8719 Personal history of other diseases of the digestive system: Secondary | ICD-10-CM | POA: Insufficient documentation

## 2012-06-02 DIAGNOSIS — E785 Hyperlipidemia, unspecified: Secondary | ICD-10-CM | POA: Insufficient documentation

## 2012-06-02 DIAGNOSIS — F3289 Other specified depressive episodes: Secondary | ICD-10-CM | POA: Insufficient documentation

## 2012-06-02 DIAGNOSIS — F1021 Alcohol dependence, in remission: Secondary | ICD-10-CM | POA: Insufficient documentation

## 2012-06-02 DIAGNOSIS — Z79899 Other long term (current) drug therapy: Secondary | ICD-10-CM | POA: Insufficient documentation

## 2012-06-02 DIAGNOSIS — K219 Gastro-esophageal reflux disease without esophagitis: Secondary | ICD-10-CM | POA: Insufficient documentation

## 2012-06-02 DIAGNOSIS — Z8781 Personal history of (healed) traumatic fracture: Secondary | ICD-10-CM | POA: Insufficient documentation

## 2012-06-02 DIAGNOSIS — Z8739 Personal history of other diseases of the musculoskeletal system and connective tissue: Secondary | ICD-10-CM | POA: Insufficient documentation

## 2012-06-02 DIAGNOSIS — Z87891 Personal history of nicotine dependence: Secondary | ICD-10-CM | POA: Insufficient documentation

## 2012-06-02 LAB — CBC WITH DIFFERENTIAL/PLATELET
Basophils Absolute: 0.1 10*3/uL (ref 0.0–0.1)
Hemoglobin: 8.3 g/dL — ABNORMAL LOW (ref 12.0–15.0)
Lymphocytes Relative: 53 % — ABNORMAL HIGH (ref 12–46)
MCH: 39.5 pg — ABNORMAL HIGH (ref 26.0–34.0)
MCHC: 36.2 g/dL — ABNORMAL HIGH (ref 30.0–36.0)
MCV: 109 fL — ABNORMAL HIGH (ref 78.0–100.0)
Monocytes Absolute: 0.8 10*3/uL (ref 0.1–1.0)
Neutro Abs: 2.2 10*3/uL (ref 1.7–7.7)
Neutrophils Relative %: 32 % — ABNORMAL LOW (ref 43–77)
RDW: 16.3 % — ABNORMAL HIGH (ref 11.5–15.5)

## 2012-06-02 LAB — COMPREHENSIVE METABOLIC PANEL
Albumin: 1.8 g/dL — ABNORMAL LOW (ref 3.5–5.2)
BUN: 11 mg/dL (ref 6–23)
Calcium: 6.7 mg/dL — ABNORMAL LOW (ref 8.4–10.5)
Creatinine, Ser: 1.01 mg/dL (ref 0.50–1.10)
GFR calc Af Amer: 71 mL/min — ABNORMAL LOW (ref >90)
Glucose, Bld: 103 mg/dL — ABNORMAL HIGH (ref 70–99)
Total Protein: 8.3 g/dL (ref 6.0–8.3)

## 2012-06-02 LAB — TROPONIN I: Troponin I: <0.3 ng/mL (ref <0.30)

## 2012-06-02 NOTE — Progress Notes (Signed)
Quick Note:  On second thoughts, a high dose of Mag Ox will give the patient diarrhea, thus I think I would not do that. Let her take her usual dose which is working for her. Right now, I think the safest route is to go to the ED and get her Mag rechecked and act accordingly. Thanks. ______

## 2012-06-02 NOTE — Telephone Encounter (Signed)
Per Dr. Shirlee Latch, Ms Caldron's Magnesium low (0.8 on BMP, 05/31/12). I called patient at home. She reports that she has palpitation sometimes without chest pain. She has mild muscle cramps in upper thighs. She seems to be alter without any confusion when talking with me. She reports that she is taking 1200 mg Magnesium bid currently. I advised her to come to ED as soon as possible for further testing and treatment. She verbally understood and agreed to do so. She will call her daughter to take her to ED.   Lorretta Harp, MD PGY2, Internal Medicine Teaching Service Pager: [phone]

## 2012-06-02 NOTE — Progress Notes (Signed)
Quick Note:  Just reviewed these clinic labs. I am sending the results to night float on call resident Dr. Clyde Lundborg, to please call and inform the patient and ask her to  1. Take 4 tabs of Mag Ox 400 mg that she has tonight instead of her regular dose.  2. Come to an ED and re-verify Magnesium, and get it repleated as soon as possible, tonight ideally.  3. Less preferable option: Patient can come to clinic tomorrow and re-verify magnesium as a POC lab and then action to be taken accordingly.  I will discuss this on the phone with Dr. Clyde Lundborg. Thank you. ______

## 2012-06-02 NOTE — ED Notes (Signed)
The pt had blood drawn Friday and she was called tonight when the pt was in bed and was told her pot was low and she needed to come to the ed.  Pt alert no distress

## 2012-06-03 LAB — MAGNESIUM: Magnesium: 1 mg/dL — ABNORMAL LOW (ref 1.5–2.5)

## 2012-06-03 MED ORDER — MAGNESIUM SULFATE 40 MG/ML IJ SOLN
2.0000 g | Freq: Once | INTRAMUSCULAR | Status: AC
  Administered 2012-06-03: 2 g via INTRAVENOUS
  Filled 2012-06-03: qty 50

## 2012-06-03 NOTE — ED Provider Notes (Signed)
History     CSN: [id number]  Arrival date & time 06/02/12  2125   First MD Initiated Contact with Patient 06/02/12 2308      Chief Complaint  Patient presents with  . abnormal labs     (Consider location/radiation/quality/duration/timing/severity/associated sxs/prior treatment) HPI History provided by patient. Recently discharged from the hospital for pneumonia. She is doing better from that standpoint. She is having some ongoing diarrhea. No blood in stools. No abdominal pain or cramping. No vomiting. No syncope or weakness. No fevers. Diarrhea is mild in severity. She had blood drawn 2 days ago and her physician called her at home tonight told to come to emergency department for low magnesium level. Patient denies any significant complaints. Past Medical History  Diagnosis Date  . Anemia, B12 deficiency   . History of acute pancreatitis   . Right knee pain     No recent imaging on chart  . 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 Dr. Su Hilt, [facility]  . Hypertriglyceridemia   . GERD (gastroesophageal reflux disease)   . Vitamin D deficiency   . Subdural hematoma 02/2008    Likely 2/2 trauma from seizure from EtOH withdrawal, chronic in nature, sees Dr. Robyne Askew. Most recent CT head 10/2009 showing stable but persistent hematoma without mass effect.  . History of seizure disorder     Likely 2/2 alcohol abuse  . Hypocalcemia   . Hypomagnesemia   . Failure to thrive in childhood     Unclear etiology  . HTN (hypertension)   . Thrombocytopenia   . Anemia, macrocytic   . Hepatomegaly     On exam  . Alcohol abuse   . Joint pain   . Alcohol abuse   . Arthritis   . Vitamin D deficiency   . Menopause   . Pancreatitis   . Insomnia   . Hyperlipidemia   . Sinusitis   . Pernicious anemia   . Subdural hematoma   . Macrocytic anemia   . Hepatomegaly   . 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 Seizures     1.5  YRS  LAST ONE  . Depression   . Hepatitis     HEPATOMEGALY   . Fx humeral neck 04/17/2011    Transverse fracture- minimally displaced- managed as outpatient   . ABNORMAL PAP SMEAR, LGSIL 07/23/2008    Annotation: HPV positive CIN I Dr. Su Hilt, [facility] Qualifier: Diagnosis of  By: Danae Chen    . Pneumonia 05/20/2012    Past Surgical History  Procedure Date  . Cesarean section 1983  . Esophagogastroduodenoscopy 07/11/2011    Procedure: ESOPHAGOGASTRODUODENOSCOPY (EGD);  Surgeon: Theda Belfast, MD;  Location: [facility];  Service: Endoscopy;  Laterality: N/A;  . Colonoscopy 07/11/2011    Procedure: COLONOSCOPY;  Surgeon: Theda Belfast, MD;  Location: [facility];  Service: Endoscopy;  Laterality: N/A;  . Eye surgery     LEFT EYE YRS AGO   . Rt colectomy 08/28/2011    Family History  Problem Relation Age of Onset  . Cancer Mother     Died from stomach cancer and "flesh eating rash  . Heart failure Father     Died in 3s from an MI  . Alcohol abuse Sister     Twin sister drinks a lot, as did both her parents and brothers  . Stroke Brother     Has 7  brothers, 1 with CVA    History  Substance Use Topics  . Smoking status: Former Smoker    Types: Cigarettes    Quit date: 09/20/2010  . Smokeless tobacco: Never Used  . Alcohol Use: Yes     Comment: 1-2 glasses wine a day    OB History    Grav Para Term Preterm Abortions TAB SAB Ect Mult Living                  Review of Systems  Constitutional: Negative for fever and chills.  HENT: Negative for neck pain and neck stiffness.   Eyes: Negative for pain.  Respiratory: Negative for shortness of breath.   Cardiovascular: Negative for chest pain.  Gastrointestinal: Positive for diarrhea. Negative for abdominal pain.  Genitourinary: Negative for dysuria.  Musculoskeletal: Negative for back pain.  Skin: Negative for rash.  Neurological: Negative for headaches.  All other systems reviewed and  are negative.    Allergies  Amitriptyline hcl and Doxycycline hyclate  Home Medications   Current Outpatient Rx  Name  Route  Sig  Dispense  Refill  . AMLODIPINE BESYLATE 10 MG PO TABS   Oral   Take 10 mg by mouth daily.         . AMOXICILLIN-POT CLAVULANATE 875-125 MG PO TABS   Oral   Take 1 tablet by mouth 2 (two) times daily.   6 tablet   0   . AMYLASE-LIPASE-PROTEASE 20-4.5-25 MU PO CPEP   Oral   Take 1 capsule by mouth 3 (three) times daily with meals.          Marland Kitchen CALCIUM CARBONATE 1250 MG PO TABS   Oral   Take 1 tablet (500 mg of elemental calcium total) by mouth 3 (three) times daily.   90 tablet   5   . CYANOCOBALAMIN 1000 MCG/ML IJ SOLN   Intramuscular   Inject 1,000 mcg into the muscle every 30 (thirty) days. Last day of month. Last dose last month         . DEXTROMETHORPHAN POLISTIREX ER 30 MG/5ML PO LQCR   Oral   Take 2.5 mLs (15 mg total) by mouth every 4 (four) hours as needed for cough.   148 mL   1   . DICLOFENAC SODIUM 75 MG PO TBEC   Oral   Take 75 mg by mouth 2 (two) times daily.         Marland Kitchen DICLOFENAC SODIUM 1 % TD GEL   Topical   Apply 2 g topically 4 (four) times daily as needed.   100 g   0   . FLUOXETINE HCL 10 MG PO CAPS   Oral   Take 10 mg by mouth daily.          Marland Kitchen FOLIC ACID 1 MG PO TABS   Oral   Take 1 tablet (1 mg total) by mouth daily.   30 tablet   11   . GABAPENTIN 300 MG PO CAPS   Oral   Take 2 capsules (600 mg total) by mouth 3 (three) times daily.   90 capsule   2   . HYDROCHLOROTHIAZIDE 25 MG PO TABS   Oral   Take 25 mg by mouth daily.          . IBUPROFEN 200 MG PO TABS   Oral   Take 200 mg by mouth every 6 (six) hours as needed. For pain         . LEVETIRACETAM 500  MG PO TABS   Oral   Take 500 mg by mouth 2 (two) times daily.         Marland Kitchen MAGNESIUM OXIDE 400 MG PO TABS   Oral   Take 2 tablets (800 mg total) by mouth 2 (two) times daily.   120 tablet   2   . ADULT MULTIVITAMIN  W/MINERALS CH   Oral   Take 1 tablet by mouth daily.   30 tablet   11   . OXYCODONE-ACETAMINOPHEN 5-325 MG PO TABS   Oral   Take 1 tablet by mouth every 4 (four) hours as needed. For pain         . PROMETHAZINE HCL 12.5 MG PO TABS   Oral   Take 12.5 mg by mouth every 6 (six) hours as needed. For nausea         . RANITIDINE HCL 150 MG PO TABS   Oral   Take 1 tablet (150 mg total) by mouth 2 (two) times daily.   60 tablet   1   . VITAMIN B-1 100 MG PO TABS   Oral   Take 1 tablet (100 mg total) by mouth daily.   60 tablet   3   . VITAMIN D (ERGOCALCIFEROL) 50000 UNITS PO CAPS   Oral   Take 1 capsule (50,000 Units total) by mouth every 7 (seven) days. Take on Thursdays.   30 capsule   1     BP 118/83  Pulse 108  Temp 97.9 F (36.6 C) (Oral)  Resp 18  SpO2 98%  Physical Exam  Constitutional: She is oriented to person, place, and time. She appears well-developed and well-nourished.  HENT:  Head: Normocephalic and atraumatic.       Mildly dry mucous membranes  Eyes: EOM are normal. Pupils are equal, round, and reactive to light.  Neck: Neck supple.  Cardiovascular: Normal rate, regular rhythm and intact distal pulses.   Pulmonary/Chest: Effort normal and breath sounds normal. No respiratory distress.  Abdominal: Soft. Bowel sounds are normal. She exhibits no distension. There is no tenderness. There is no rebound.  Musculoskeletal: Normal range of motion. She exhibits no edema.  Neurological: She is alert and oriented to person, place, and time.  Skin: Skin is warm and dry.    ED Course  Procedures (including critical care time)  Results for orders placed during the hospital encounter of 06/02/12  CBC WITH DIFFERENTIAL      Component Value Range   WBC 7.0  4.0 - 10.5 K/uL   RBC 2.10 (*) 3.87 - 5.11 MIL/uL   Hemoglobin 8.3 (*) 12.0 - 15.0 g/dL   HCT 40.9 (*) 81.1 - 91.4 %   MCV 109.0 (*) 78.0 - 100.0 fL   MCH 39.5 (*) 26.0 - 34.0 pg   MCHC 36.2 (*) 30.0  - 36.0 g/dL   RDW 78.2 (*) 95.6 - 21.3 %   Platelets 319  150 - 400 K/uL   Neutrophils Relative 32 (*) 43 - 77 %   Neutro Abs 2.2  1.7 - 7.7 K/uL   Lymphocytes Relative 53 (*) 12 - 46 %   Lymphs Abs 3.7  0.7 - 4.0 K/uL   Monocytes Relative 12  3 - 12 %   Monocytes Absolute 0.8  0.1 - 1.0 K/uL   Eosinophils Relative 2  0 - 5 %   Eosinophils Absolute 0.1  0.0 - 0.7 K/uL   Basophils Relative 2 (*) 0 - 1 %   Basophils  Absolute 0.1  0.0 - 0.1 K/uL  COMPREHENSIVE METABOLIC PANEL      Component Value Range   Sodium 137  135 - 145 mEq/L   Potassium 4.3  3.5 - 5.1 mEq/L   Chloride 104  96 - 112 mEq/L   CO2 18 (*) 19 - 32 mEq/L   Glucose, Bld 103 (*) 70 - 99 mg/dL   BUN 11  6 - 23 mg/dL   Creatinine, Ser 1.61  0.50 - 1.10 mg/dL   Calcium 6.7 (*) 8.4 - 10.5 mg/dL   Total Protein 8.3  6.0 - 8.3 g/dL   Albumin 1.8 (*) 3.5 - 5.2 g/dL   AST 95 (*) 0 - 37 U/L   ALT 17  0 - 35 U/L   Alkaline Phosphatase 201 (*) 39 - 117 U/L   Total Bilirubin 0.2 (*) 0.3 - 1.2 mg/dL   GFR calc non Af Amer 61 (*) >90 mL/min   GFR calc Af Amer 71 (*) >90 mL/min  TROPONIN I      Component Value Range   Troponin I <0.30  <0.30 ng/mL  MAGNESIUM      Component Value Range   Magnesium 1.0 (*) 1.5 - 2.5 mg/dL   IV fluids and oral fluids provided.  12:42 AM d/w [facility] resident on call, DR Dalphine Handing as above, he recs IV Magnesium now and d/c home with inc MagOx at home, then follow up tomorrow in clinic for recheck magnesium level.   MDM  Low magnesium and diarrhea  Labs reviewed as above. IV fluids provided. Medicine consult. IV magnesium discharge home, followup tomorrow in clinic  Vital signs and nursing notes reviewed and considered      Sunnie Nielsen, MD 06/03/12 [phone]

## 2012-06-06 NOTE — Telephone Encounter (Signed)
We need to check vitamin D levels before refilling the next prescription.

## 2012-06-07 ENCOUNTER — Encounter: Payer: Self-pay | Admitting: Internal Medicine

## 2012-06-07 ENCOUNTER — Ambulatory Visit (INDEPENDENT_AMBULATORY_CARE_PROVIDER_SITE_OTHER): Payer: PRIVATE HEALTH INSURANCE | Admitting: Internal Medicine

## 2012-06-07 VITALS — BP 97/68 | HR 84 | Temp 98.7°F | Ht 62.0 in | Wt 81.2 lb

## 2012-06-07 DIAGNOSIS — R531 Weakness: Secondary | ICD-10-CM

## 2012-06-07 DIAGNOSIS — I5030 Unspecified diastolic (congestive) heart failure: Secondary | ICD-10-CM

## 2012-06-07 DIAGNOSIS — D539 Nutritional anemia, unspecified: Secondary | ICD-10-CM

## 2012-06-07 DIAGNOSIS — D509 Iron deficiency anemia, unspecified: Secondary | ICD-10-CM

## 2012-06-07 DIAGNOSIS — R634 Abnormal weight loss: Secondary | ICD-10-CM

## 2012-06-07 DIAGNOSIS — D649 Anemia, unspecified: Secondary | ICD-10-CM

## 2012-06-07 DIAGNOSIS — J154 Pneumonia due to other streptococci: Secondary | ICD-10-CM

## 2012-06-07 DIAGNOSIS — R790 Abnormal level of blood mineral: Secondary | ICD-10-CM

## 2012-06-07 DIAGNOSIS — I10 Essential (primary) hypertension: Secondary | ICD-10-CM

## 2012-06-07 LAB — BASIC METABOLIC PANEL WITH GFR
Chloride: 108 mEq/L (ref 96–112)
GFR, Est African American: 86 mL/min
GFR, Est Non African American: 74 mL/min
Potassium: 3.9 mEq/L (ref 3.5–5.3)

## 2012-06-07 LAB — MAGNESIUM: Magnesium: 1.1 mg/dL — ABNORMAL LOW (ref 1.5–2.5)

## 2012-06-07 NOTE — Assessment & Plan Note (Signed)
Patient's macrocytic anemia is likely caused by low folic acid level. Other possibilities include GI loss given her gastritis on EGD on 07/11/11 and colon poly on colonoscopy on 07/11/11. However, her anemia panel showed normal iron level which is not consistent with GI blood loss. Will continue oral folic acid and check CBC.

## 2012-06-07 NOTE — Assessment & Plan Note (Signed)
Patient is very cachexia. Her BW is 81.Lb which is stable. It is likely due to poor absorption secondary to her chronic pancreatitis. She is currently taking Ensure and Pangestyme. Patient had extensive work up which did not show any malignance, including mammogram 04/01/12, pap smear 03/28/12, colonoscopy and EGD 07/11/11, CT-abd on 07/13/11.  Will continue current regimen and follow up.

## 2012-06-07 NOTE — Progress Notes (Signed)
[name], female   DOB: 1955/08/08, 57 y.o.   MRN: [id number]  Subjective:   [name] female   DOB: [birth year] 57 y.o.   MRN: [id number]  HPI: [name] is a 57 y.o. with PMH as outlined below, who presents with a hospital follow up visit.  Patient was hospitalized from 12/16 to 05/23/12 due to CAP and sepsis. She was successfully treated with antibiotics and discharge at stable condition. Today, she feels generally better, but has mild cough, mild chest pain, and sputum production. All these symptoms are improving. She does not have fever or chills.   Regarding her HTN,  she is taking amlodipine and HCTZ currently. Today her bp is 97/68 mmHg.  Regarding her anemia: patient has macrocytic anemia. Her hemoglobin was 6.8 with MCV 103.7 at discharge. Her recent Hgb is 8.3 at 06/03/11. She has noted folate deficiency in 03/2012. she is taking folate supplement currently.   Regarding her weight loss, Unsure etiology.  She had a mammogram in 03/2012 which was negative. She had an upper endoscopy and colonoscopy 07/2011 which showed gastritis, duodenitis, colonic mass which was biopsied and negative for malignancy.  She is currently taking Ensure/Boost supplementation at home. Her BW is 81.2 LBs which is stable. She had a CT-abd on 07/13/11 showing chronic pancreatitis.   Regarding her hypomagnesia, she was recently treated with IV Mg in ED due to low Mg of 0.8 on 06/03/11. Today, she does not have symptoms for hypomagnesia, such as muscle pain, confusion or palpitation. She is taking 400 mg of Mag-Ox bid at home currently.   Past Medical History  Diagnosis Date  . Anemia, B12 deficiency   . History of acute pancreatitis   . Right knee pain     No recent imaging on chart  . 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 Dr. Su Hilt, [facility]  . Hypertriglyceridemia   . GERD  (gastroesophageal reflux disease)   . Vitamin D deficiency   . Subdural hematoma 02/2008    Likely 2/2 trauma from seizure from EtOH withdrawal, chronic in nature, sees Dr. Robyne Askew. Most recent CT head 10/2009 showing stable but persistent hematoma without mass effect.  . History of seizure disorder     Likely 2/2 alcohol abuse  . Hypocalcemia   . Hypomagnesemia   . Failure to thrive in childhood     Unclear etiology  . HTN (hypertension)   . Thrombocytopenia   . Anemia, macrocytic   . Hepatomegaly     On exam  . Alcohol abuse   . Joint pain   . Alcohol abuse   . Arthritis   . Vitamin D deficiency   . Menopause   . Pancreatitis   . Insomnia   . Hyperlipidemia   . Sinusitis   . Pernicious anemia   . Subdural hematoma   . Macrocytic anemia   . Hepatomegaly   . 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 Seizures     1.5 YRS  LAST ONE  . Depression   . Hepatitis     HEPATOMEGALY   . Fx humeral neck 04/17/2011    Transverse fracture- minimally displaced- managed as outpatient   . ABNORMAL PAP SMEAR, LGSIL 07/23/2008    Annotation: HPV positive CIN I Dr. Su Hilt, [facility] Qualifier: Diagnosis of  By: Danae Chen    . Pneumonia 05/20/2012  Current Outpatient Prescriptions  Medication Sig Dispense Refill  . amoxicillin-clavulanate (AUGMENTIN) 875-125 MG per tablet Take 1 tablet by mouth 2 (two) times daily.  6 tablet  0  . amylase-lipase-protease (PANGESTYME EC) 20-4.5-25 MU per capsule Take 1 capsule by mouth 3 (three) times daily with meals.       . calcium carbonate (OS-CAL - DOSED IN MG OF ELEMENTAL CALCIUM) 1250 MG tablet Take 1 tablet (500 mg of elemental calcium total) by mouth 3 (three) times daily.  90 tablet  5  . cyanocobalamin (,VITAMIN B-12,) 1000 MCG/ML injection Inject 1,000 mcg into the muscle every 30 (thirty) days. Last day of month. Last dose last month      . dextromethorphan (DELSYM) 30 MG/5ML liquid Take 2.5 mLs (15 mg total) by  mouth every 4 (four) hours as needed for cough.  148 mL  1  . diclofenac (VOLTAREN) 75 MG EC tablet Take 75 mg by mouth 2 (two) times daily.      . diclofenac sodium (VOLTAREN) 1 % GEL Apply 2 g topically 4 (four) times daily as needed.  100 g  0  . FLUoxetine (PROZAC) 10 MG capsule Take 10 mg by mouth daily.       . folic acid (FOLVITE) 1 MG tablet TAKE 1 TABLET (1 MG TOTAL) BY MOUTH DAILY.  30 tablet  1  . gabapentin (NEURONTIN) 300 MG capsule Take 2 capsules (600 mg total) by mouth 3 (three) times daily.  90 capsule  2  . ibuprofen (ADVIL,MOTRIN) 200 MG tablet Take 200 mg by mouth every 6 (six) hours as needed. For pain      . levETIRAcetam (KEPPRA) 500 MG tablet Take 500 mg by mouth 2 (two) times daily.      . magnesium oxide (MAG-OX) 400 MG tablet Take 2 tablets (800 mg total) by mouth 2 (two) times daily.  120 tablet  2  . Multiple Vitamin (MULITIVITAMIN WITH MINERALS) TABS Take 1 tablet by mouth daily.  30 tablet  11  . oxyCODONE-acetaminophen (PERCOCET/ROXICET) 5-325 MG per tablet Take 1 tablet by mouth every 4 (four) hours as needed. For pain      . promethazine (PHENERGAN) 12.5 MG tablet Take 12.5 mg by mouth every 6 (six) hours as needed. For nausea      . ranitidine (ZANTAC) 150 MG tablet Take 1 tablet (150 mg total) by mouth 2 (two) times daily.  60 tablet  1  . thiamine 100 MG tablet TAKE 1 TABLET (100 MG TOTAL) BY MOUTH DAILY.  30 tablet  1  . Vitamin D, Ergocalciferol, (DRISDOL) 50000 UNITS CAPS TAKE 1 CAPSULE (50,000 UNITS TOTAL) BY MOUTH ONCE A WEEK.  4 capsule  0   Family History  Problem Relation Age of Onset  . Cancer Mother     Died from stomach cancer and "flesh eating rash  . Heart failure Father     Died in 55s from an MI  . Alcohol abuse Sister     Twin sister drinks a lot, as did both her parents and brothers  . Stroke Brother     Has 7 brothers, 1 with CVA   History   Social History  . Marital Status: Divorced    Spouse Name: N/A    Number of Children: N/A    . Years of Education: N/A   Social History Main Topics  . Smoking status: Former Smoker    Types: Cigarettes    Quit date: 09/20/2010  . Smokeless tobacco: Never  Used  . Alcohol Use: Yes     Comment: 1-2 glasses wine a day  . Drug Use: No     Comment: HX  USE   . Sexually Active: Not Currently   Other Topics Concern  . None   Social History Narrative   Lives with her significant other and 2 grandchildren. 1 childHas 7 brothers and 4 sisters, 1 twin sister.Unemployed, worked in [employer]. Abuses alcohol-drinks 1 glass of wine daily No drug use. Former cigarette use quit 1.5 years ago.  11 th grade education    Review of Systems:   Positive for weight loss and low back pain.   Negative as per HPI.     Objective:  Physical Exam: Filed Vitals:   06/07/12 1322  BP: 97/68  Pulse: 84  Temp: 98.7 F (37.1 C)  TempSrc: Oral  Height: 5\' 2"  (1.575 m)  Weight: 81 lb 3.2 oz (36.832 kg)  SpO2: 92%   Objective:   Physical Exam  Constitutional: She is oriented to person, place, and time. She appears cachectic. She is cooperative.  HENT:   Head: Normocephalic.   Mouth/Throat: Oropharynx is clear and moist.  Eyes: pupil ERRL.  Cardiovascular: Regular rhythm. No murmur heard. Pulmonary/Chest: Effort normal, there are scattered rhonchi at right lower lobe. No rales or rubs. Abdominal: Soft. Bowel sounds are normal.  Genitourinary: Vagina normal. No vaginal discharge found.  Neurological: She is alert and oriented to person, place, and time. She has normal reflexes.  Psychiatric: She has a normal mood and affect. Her behavior is normal.  Assessment & Plan:

## 2012-06-07 NOTE — Assessment & Plan Note (Signed)
It is improving. She had Mg level of 0.8 and was treated with IV magnesium on 06/03/11. She is currently taking Mag-Ox 400 mg bid at home. Today stat BMP showed that her Mg level is 1.1. Will continue current regimen.

## 2012-06-07 NOTE — Patient Instructions (Signed)
1. Your blood pressure is running low, Please stop taking amlodipine and hydrochlorothiazide from now. Please check your blood pressure in one week at [facility]. If your blood pressure is higher than 145/90 mmHg, you can start taking your amlodipine and hydrochlorothiazide again to 2. Please take all medications as prescribed.  3. If you have worsening of your symptoms or new symptoms arise, please call the clinic ([phone]), or go to the ER immediately if symptoms are severe.  You have done great job in taking all your medications. I appreciate it very much. Please continue doing that.

## 2012-06-07 NOTE — Assessment & Plan Note (Signed)
Patient's bp is low today at 97/68 mmHg. Will d/c HTN medication (amlodipine and HCTZ). Patient is advised to measure here Bp at [facility] or [facility]. She is advised to resume her bp medication, if her Bp is elevated again.

## 2012-06-07 NOTE — Assessment & Plan Note (Signed)
She is recovering well from her recent PNA. She has some residual symptoms which are improving. Her lung ausculation has no rales or rubs. She dose not have fever or chills. She needs to have a repeated CXR in 6 week counting from 05/23/12.

## 2012-06-10 ENCOUNTER — Telehealth: Payer: Self-pay | Admitting: Licensed Clinical Social Worker

## 2012-06-10 NOTE — Telephone Encounter (Signed)
[name] [name] was referred to CSW for initiation of PCS request.  During [name] [name] appt, pt has increased weakness and would benefit from [name].  CSW placed call to [name] [name].  Pt would like to utilize [name] if eligible for services.  Confirmed address and phone number. CSW will initiate form.

## 2012-07-09 ENCOUNTER — Encounter: Payer: Self-pay | Admitting: Internal Medicine

## 2012-07-09 ENCOUNTER — Ambulatory Visit (INDEPENDENT_AMBULATORY_CARE_PROVIDER_SITE_OTHER): Payer: PRIVATE HEALTH INSURANCE | Admitting: Internal Medicine

## 2012-07-09 VITALS — BP 129/90 | HR 88 | Temp 97.2°F | Ht 61.0 in | Wt 82.7 lb

## 2012-07-09 DIAGNOSIS — E46 Unspecified protein-calorie malnutrition: Secondary | ICD-10-CM

## 2012-07-09 DIAGNOSIS — E079 Disorder of thyroid, unspecified: Secondary | ICD-10-CM

## 2012-07-09 DIAGNOSIS — F1010 Alcohol abuse, uncomplicated: Secondary | ICD-10-CM

## 2012-07-09 DIAGNOSIS — R634 Abnormal weight loss: Secondary | ICD-10-CM

## 2012-07-09 NOTE — Progress Notes (Signed)
Internal Medicine Clinic Note Date: 07/09/2012  Patient name: [name] [name]  Medical record number: [id number]  Date of birth: 1956-01-05   HISTORY OF PRESENT ILLNESS [name] [name] is a 57 y.o. year old female with past medical problems significant for substance abuse, alcoholism, malnutrition and electrolyte imbalnces, who comes to the clinic today for a regular follow up visit.   Follow up for pneumonia: The patient was hospitalized for pneumonia in December and has recovered completely. She denies any chest pain, cough or fever-chills. She offers no new complaints at this time.   Weight loss: She is still concerned about her weight loss, however, she has maintained a weight of around 80 lbs since 2013. She is on ensure and says that she tries to eat healthy and increase her calorie intake.    Electrolyte imbalances: She is regularly taking her supplements of magnesium and calcium. She denies any weakness, paralysis, spasms, chest pain, any dizziness, or fainting.   Substance Abuse: She says she has been clean for a year. She asks for opiates. She was recently held for DWI in December when she was driving under influence (with her grandchildren in the car). On asking if her licence was revoked, she said it had already been revoked from a previous DWI.  Seizures: Takes her Keppra only on Mondays and Saturdays. Has not had any seizures.    PAST MEDICAL HISTORY  has a past medical history of Anemia, B12 deficiency; History of acute pancreatitis; Right knee pain; Abnormal Pap smear and cervical HPV (human papillomavirus); Hypertriglyceridemia; GERD (gastroesophageal reflux disease); Vitamin D deficiency; Subdural hematoma (02/2008); History of seizure disorder; Hypocalcemia; Hypomagnesemia; Failure to thrive in childhood; HTN (hypertension); Thrombocytopenia; Anemia, macrocytic; Hepatomegaly; Alcohol abuse; Joint pain; Alcohol abuse; Arthritis; Vitamin D deficiency; Menopause; Pancreatitis;  Insomnia; Hyperlipidemia; Sinusitis; Pernicious anemia; Subdural hematoma; Macrocytic anemia; Hepatomegaly; Tuberculosis; Seizures; Depression; Hepatitis; Fx humeral neck (04/17/2011); ABNORMAL PAP SMEAR, LGSIL (07/23/2008); and Pneumonia (05/20/2012).  MEDICATIONS  amylase-lipase-protease (PANGESTYME EC) 20-4.5-25 MU per capsule 1 capsule, 3 times daily with meals  calcium carbonate (OS-CAL - DOSED IN MG OF ELEMENTAL CALCIUM) 1250 MG tablet 1 tablet, 3 times daily  cyanocobalamin (,VITAMIN B-12,) 1000 MCG/ML injection 1,000 mcg, Every 30 days diclofenac sodium (VOLTAREN) 1 % GEL 2 g, 4 times daily PRN  FLUoxetine (PROZAC) 10 MG capsule 10 mg, Daily  folic acid (FOLVITE) 1 MG tablet gabapentin (NEURONTIN) 300 MG capsule 600 mg, 3 times daily  ibuprofen (ADVIL,MOTRIN) 200 MG tablet 200 mg, Every 6 hours PRN  levETIRAcetam (KEPPRA) 500 MG tablet 500 mg, 2 times daily - She takes it only twice a week. magnesium oxide (MAG-OX) 400 MG tablet 800 mg, 2 times daily  Multiple Vitamin (MULITIVITAMIN WITH MINERALS) TABS 1 tablet, Daily promethazine (PHENERGAN) 12.5 MG tablet 12.5 mg, Every 6 hours PRN  ranitidine (ZANTAC) 150 MG tablet 150 mg, 2 times daily   REVIEW OF SYSTEMS Constitutional: Denies fever, chills, fatigue. Trying to increase appetite. HEENT: Denies congestion, sore throat, rhinorrhea, sneezing, trouble swallowing, neck pain.   Respiratory: Denies SOB, DOE, cough, chest tightness,  and wheezing.   Cardiovascular: Denies chest pain, palpitations and leg swelling.  Gastrointestinal: Denies nausea, vomiting, abdominal pain, diarrhea, constipation. Genitourinary: Denies any bladder problems. Musculoskeletal: Denies myalgias, joint swelling, arthralgias. Admits back pain. Neurological: Denies dizziness, seizures, syncope, weakness, light-headedness, numbness and headaches.   VITALS Filed Vitals:   07/09/12 1051  BP: 129/90  Pulse: 88  Temp: 97.2 F (36.2 C)   PHYSICAL EXAM Vitals  reviewed.  General: Skinny, comfortably sitting in chair. HEENT: PERRL, EOMI, no scleral icterus. I feel irregularities in her thyroid region which feel like nodules, like last time, move with swallowing.   Heart: RRR, no rubs, murmurs or gallops. Lungs: Clear to auscultation bilaterally, no wheezes, rales, or rhonchi. Abdomen: Soft, nontender, nondistended, BS present. Extremities: Warm, no pedal edema.  ASSESSMENT  Substance Abuse: I spent over 15 minutes couneling [name] [name] regarding alcohol misuse. She did not appear to be really motivated to abstain at this point. I encouraged her to get enrolled in the substance abuse program that her lawyer has suggested, and take it seriously. I refused to give her the opiates she asked for, and she was calm. She said, "I just have to ask", which is exactly what she had said last visit to me.  Electrolyte imbalances: She is doing much better with respect to her calcium and magnesium levels.   Results for [name] [name] (MRN [id number]) as of 07/09/2012 21:41  Ref. Range 05/23/2012 06:00 05/31/2012 10:10 06/02/2012 22:14 06/02/2012 23:26 06/07/2012 13:33  Calcium Latest Range: 8.4-10.5 mg/dL 5.9 (LL) 6.6 (L) 6.7 (L)  7.5 (L)  Results for [name] [name] (MRN [id number]) as of 07/09/2012 21:41  Ref. Range 05/23/2012 06:00 05/31/2012 10:10 06/02/2012 22:14 06/02/2012 23:26 06/07/2012 13:33  Magnesium Latest Range: 1.5-2.5 mg/dL 1.4 (L) 0.8 (LL)  1.0 (L) 1.1 (L)   We will continue the current treatment regimen.  Thyroid dysfunction: Her thyroid function tests have never been normal. With the conitnuing weight loss, neck findings, her disarrayed electrolytes, and her lab work at different times, I would re-refer her to an endocrinologist, and get a thyroid ultrasound. The previous orders were cancelled because the patient probably did not get an appointment.   Weight loss: This could most likely be due to substance abuse related malnutrition. On ensure,  trying to increase calorie intake.   Preventive Measures: uptodate.  NEXT VISIT: 3 Months, complete blood work - CMP, Vitamin D levels, follow up on endocrine.    [name] [name] 07/09/2012, 2:59 PM.

## 2012-07-09 NOTE — Patient Instructions (Addendum)
Hello Ms Rolon,  I am glad to see that you have recovered from pneumonia. I am glad that you are compliant with all your medications. Please keep doing that. As we discussed, I will again arrange for an specialist referral for your thyroid, and an ultrasound to check it. Please abstain from alcohol. I will encourage you to join the substance abuse program you referred to in this visit.  Please keep taking ensure, and try to take healthy meals three times a day.  Next visit in 3 months [name] [name]

## 2012-07-10 NOTE — Progress Notes (Signed)
Pt aware of [place] 07/18/12 8:30AM [facility] - no restrictions. [facility] Martisha Toulouse RN 07/10/12 10:15AM

## 2012-07-15 ENCOUNTER — Encounter: Payer: Self-pay | Admitting: Licensed Clinical Social Worker

## 2012-07-16 ENCOUNTER — Emergency Department (HOSPITAL_COMMUNITY)
Admission: EM | Admit: 2012-07-16 | Discharge: 2012-07-16 | Disposition: A | Discharge status: Home / Self Care | Payer: PRIVATE HEALTH INSURANCE | Attending: Emergency Medicine | Admitting: Emergency Medicine

## 2012-07-16 ENCOUNTER — Encounter (HOSPITAL_COMMUNITY): Payer: Self-pay | Admitting: *Deleted

## 2012-07-16 ENCOUNTER — Ambulatory Visit: Payer: PRIVATE HEALTH INSURANCE | Admitting: Internal Medicine

## 2012-07-16 DIAGNOSIS — Z87891 Personal history of nicotine dependence: Secondary | ICD-10-CM | POA: Insufficient documentation

## 2012-07-16 DIAGNOSIS — E876 Hypokalemia: Secondary | ICD-10-CM | POA: Insufficient documentation

## 2012-07-16 DIAGNOSIS — Z8669 Personal history of other diseases of the nervous system and sense organs: Secondary | ICD-10-CM | POA: Insufficient documentation

## 2012-07-16 DIAGNOSIS — Z8739 Personal history of other diseases of the musculoskeletal system and connective tissue: Secondary | ICD-10-CM | POA: Insufficient documentation

## 2012-07-16 DIAGNOSIS — K219 Gastro-esophageal reflux disease without esophagitis: Secondary | ICD-10-CM | POA: Insufficient documentation

## 2012-07-16 DIAGNOSIS — Z8701 Personal history of pneumonia (recurrent): Secondary | ICD-10-CM | POA: Insufficient documentation

## 2012-07-16 DIAGNOSIS — F329 Major depressive disorder, single episode, unspecified: Secondary | ICD-10-CM | POA: Insufficient documentation

## 2012-07-16 DIAGNOSIS — F10129 Alcohol abuse with intoxication, unspecified: Secondary | ICD-10-CM

## 2012-07-16 DIAGNOSIS — Z79899 Other long term (current) drug therapy: Secondary | ICD-10-CM | POA: Insufficient documentation

## 2012-07-16 DIAGNOSIS — Z8619 Personal history of other infectious and parasitic diseases: Secondary | ICD-10-CM | POA: Insufficient documentation

## 2012-07-16 DIAGNOSIS — I10 Essential (primary) hypertension: Secondary | ICD-10-CM | POA: Insufficient documentation

## 2012-07-16 DIAGNOSIS — Z87828 Personal history of other (healed) physical injury and trauma: Secondary | ICD-10-CM | POA: Insufficient documentation

## 2012-07-16 DIAGNOSIS — Z8639 Personal history of other endocrine, nutritional and metabolic disease: Secondary | ICD-10-CM | POA: Insufficient documentation

## 2012-07-16 DIAGNOSIS — E781 Pure hyperglyceridemia: Secondary | ICD-10-CM | POA: Insufficient documentation

## 2012-07-16 DIAGNOSIS — R259 Unspecified abnormal involuntary movements: Secondary | ICD-10-CM | POA: Insufficient documentation

## 2012-07-16 DIAGNOSIS — F3289 Other specified depressive episodes: Secondary | ICD-10-CM | POA: Insufficient documentation

## 2012-07-16 DIAGNOSIS — R112 Nausea with vomiting, unspecified: Secondary | ICD-10-CM | POA: Insufficient documentation

## 2012-07-16 DIAGNOSIS — E785 Hyperlipidemia, unspecified: Secondary | ICD-10-CM | POA: Insufficient documentation

## 2012-07-16 DIAGNOSIS — Z0289 Encounter for other administrative examinations: Secondary | ICD-10-CM | POA: Insufficient documentation

## 2012-07-16 DIAGNOSIS — Z8611 Personal history of tuberculosis: Secondary | ICD-10-CM | POA: Insufficient documentation

## 2012-07-16 DIAGNOSIS — E559 Vitamin D deficiency, unspecified: Secondary | ICD-10-CM | POA: Insufficient documentation

## 2012-07-16 DIAGNOSIS — Z8781 Personal history of (healed) traumatic fracture: Secondary | ICD-10-CM | POA: Insufficient documentation

## 2012-07-16 DIAGNOSIS — Z862 Personal history of diseases of the blood and blood-forming organs and certain disorders involving the immune mechanism: Secondary | ICD-10-CM | POA: Insufficient documentation

## 2012-07-16 DIAGNOSIS — Z8719 Personal history of other diseases of the digestive system: Secondary | ICD-10-CM | POA: Insufficient documentation

## 2012-07-16 LAB — URINALYSIS, ROUTINE W REFLEX MICROSCOPIC
Bilirubin Urine: NEGATIVE
Ketones, ur: NEGATIVE mg/dL
Nitrite: NEGATIVE
Protein, ur: 100 mg/dL — AB
Urobilinogen, UA: 1 mg/dL (ref 0.0–1.0)
pH: 5.5 (ref 5.0–8.0)

## 2012-07-16 LAB — CBC WITH DIFFERENTIAL/PLATELET
Eosinophils Relative: 1 % (ref 0–5)
HCT: 29.6 % — ABNORMAL LOW (ref 36.0–46.0)
Hemoglobin: 10.1 g/dL — ABNORMAL LOW (ref 12.0–15.0)
Lymphocytes Relative: 55 % — ABNORMAL HIGH (ref 12–46)
MCV: 101 fL — ABNORMAL HIGH (ref 78.0–100.0)
Monocytes Absolute: 0.9 10*3/uL (ref 0.1–1.0)
Monocytes Relative: 15 % — ABNORMAL HIGH (ref 3–12)
Neutro Abs: 1.6 10*3/uL — ABNORMAL LOW (ref 1.7–7.7)
RDW: 13.8 % (ref 11.5–15.5)
WBC: 5.6 10*3/uL (ref 4.0–10.5)

## 2012-07-16 LAB — COMPREHENSIVE METABOLIC PANEL
AST: 81 U/L — ABNORMAL HIGH (ref 0–37)
Albumin: 2.1 g/dL — ABNORMAL LOW (ref 3.5–5.2)
Alkaline Phosphatase: 338 U/L — ABNORMAL HIGH (ref 39–117)
BUN: 6 mg/dL (ref 6–23)
Chloride: 103 mEq/L (ref 96–112)
Creatinine, Ser: 1.22 mg/dL — ABNORMAL HIGH (ref 0.50–1.10)
Potassium: 3.1 mEq/L — ABNORMAL LOW (ref 3.5–5.1)
Total Bilirubin: 0.3 mg/dL (ref 0.3–1.2)
Total Protein: 8.9 g/dL — ABNORMAL HIGH (ref 6.0–8.3)

## 2012-07-16 LAB — RAPID URINE DRUG SCREEN, HOSP PERFORMED
Amphetamines: NOT DETECTED
Barbiturates: NOT DETECTED
Benzodiazepines: NOT DETECTED
Cocaine: NOT DETECTED
Tetrahydrocannabinol: NOT DETECTED

## 2012-07-16 LAB — URINE MICROSCOPIC-ADD ON

## 2012-07-16 LAB — MAGNESIUM: Magnesium: 0.9 mg/dL — CL (ref 1.5–2.5)

## 2012-07-16 MED ORDER — ONDANSETRON 4 MG PO TBDP
4.0000 mg | ORAL_TABLET | Freq: Once | ORAL | Status: AC
  Administered 2012-07-16: 4 mg via ORAL
  Filled 2012-07-16: qty 1

## 2012-07-16 MED ORDER — POTASSIUM CHLORIDE CRYS ER 20 MEQ PO TBCR: 20.0000 meq | EXTENDED_RELEASE_TABLET | Freq: Two times a day (BID) | ORAL | Status: DC

## 2012-07-16 MED ORDER — MAGNESIUM SULFATE 40 MG/ML IJ SOLN
2.0000 g | Freq: Once | INTRAMUSCULAR | Status: DC
  Filled 2012-07-16: qty 50

## 2012-07-16 MED ORDER — POTASSIUM CHLORIDE CRYS ER 20 MEQ PO TBCR
40.0000 meq | EXTENDED_RELEASE_TABLET | Freq: Once | ORAL | Status: AC
  Administered 2012-07-16: 40 meq via ORAL
  Filled 2012-07-16: qty 1

## 2012-07-16 NOTE — Addendum Note (Signed)
Addended by: Neomia Dear on: 07/16/2012 05:55 PM   Modules accepted: Orders

## 2012-07-16 NOTE — ED Notes (Signed)
Patient requesting detox from ETOH. Coming from home. ACT team notified

## 2012-07-16 NOTE — ED Notes (Signed)
Tolerating PO

## 2012-07-16 NOTE — ED Notes (Signed)
IV team paged.  

## 2012-07-16 NOTE — Progress Notes (Signed)
Ms. Blasingame present to [facility] today inquiring about Detox.  This is the second time pt has come to [facility] requesting information on detox.  During last visit, CSW referred Ms. Wolfman to [facility] to establish services.  CSW met with pt.  Ms. Wish states she drinks 1/5th of wine daily, last time she drank was 2/8.  Pt's only complaint at this time is "sweats".  Pt states she has bed available on 2/19 but states she needs detox.  CSW placed call to [facility].  Confirmed pt does have bed appt on 2/19 and pt is in need of Proof of Detox and 3 days worth of medication.  CSW inquired as to what is needed for proof of detox, [facility] staff informed CSW this is provided through hospital ER.  CSW placed call to hospital ER, they do have a protocol in place.  CSW referred Ms. Lyall to [facility] ER.

## 2012-07-16 NOTE — ED Provider Notes (Signed)
History     CSN: [id number]  Arrival date & time 07/16/12  0272   First MD Initiated Contact with Patient 07/16/12 [phone]      Chief Complaint  Patient presents with  . Alcohol Problem    (Consider location/radiation/quality/duration/timing/severity/associated sxs/prior treatment) Patient is a 57 y.o. female presenting with alcohol problem. The history is provided by the patient.  Alcohol Problem This is a chronic problem. The current episode started more than 1 year ago. The problem occurs constantly. The problem has been unchanged. Associated symptoms include nausea and vomiting. Pertinent negatives include no abdominal pain, arthralgias, chest pain, congestion, fatigue, fever, headaches, rash or weakness. Nothing aggravates the symptoms. She has tried nothing for the symptoms.    Past Medical History  Diagnosis Date  . Anemia, B12 deficiency   . History of acute pancreatitis   . Right knee pain     No recent imaging on chart  . 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 Dr. Su Hilt, [facility]  . Hypertriglyceridemia   . GERD (gastroesophageal reflux disease)   . Vitamin D deficiency   . Subdural hematoma 02/2008    Likely 2/2 trauma from seizure from EtOH withdrawal, chronic in nature, sees Dr. Robyne Askew. Most recent CT head 10/2009 showing stable but persistent hematoma without mass effect.  . History of seizure disorder     Likely 2/2 alcohol abuse  . Hypocalcemia   . Hypomagnesemia   . Failure to thrive in childhood     Unclear etiology  . HTN (hypertension)   . Thrombocytopenia   . Anemia, macrocytic   . Hepatomegaly     On exam  . Alcohol abuse   . Joint pain   . Alcohol abuse   . Arthritis   . Vitamin D deficiency   . Menopause   . Pancreatitis   . Insomnia   . Hyperlipidemia   . Sinusitis   . Pernicious anemia   . Subdural hematoma   . Macrocytic anemia   . Hepatomegaly   . Tuberculosis      AS CHILD MED TX  . Seizures     1.5 YRS  LAST ONE  . Depression   . Hepatitis     HEPATOMEGALY   . Fx humeral neck 04/17/2011    Transverse fracture- minimally displaced- managed as outpatient   . ABNORMAL PAP SMEAR, LGSIL 07/23/2008    Annotation: HPV positive CIN I Dr. Su Hilt, [facility] Qualifier: Diagnosis of  By: Danae Chen    . Pneumonia 05/20/2012    Past Surgical History  Procedure Laterality Date  . Cesarean section  1983  . Esophagogastroduodenoscopy  07/11/2011    Procedure: ESOPHAGOGASTRODUODENOSCOPY (EGD);  Surgeon: Theda Belfast, MD;  Location: [facility];  Service: Endoscopy;  Laterality: N/A;  . Colonoscopy  07/11/2011    Procedure: COLONOSCOPY;  Surgeon: Theda Belfast, MD;  Location: [facility] ENDOSCOPY;  Service: Endoscopy;  Laterality: N/A;  . Eye surgery      LEFT EYE YRS AGO   . Rt colectomy  08/28/2011    Family History  Problem Relation Age of Onset  . Cancer Mother     Died from stomach cancer and "flesh eating rash  . Heart failure Father     Died in 77s from an MI  . Alcohol abuse Sister     Twin sister drinks a lot, as did both her parents and brothers  . Stroke  Brother     Has 7 brothers, 1 with CVA    History  Substance Use Topics  . Smoking status: Former Smoker    Types: Cigarettes    Quit date: 09/20/2010  . Smokeless tobacco: Never Used  . Alcohol Use: Yes     Comment: 1-2 glasses wine a day    OB History   Grav Para Term Preterm Abortions TAB SAB Ect Mult Living                  Review of Systems  Constitutional: Negative for fever and fatigue.  HENT: Negative for congestion, rhinorrhea and postnasal drip.   Eyes: Negative for photophobia and visual disturbance.  Respiratory: Negative for chest tightness, shortness of breath and wheezing.   Cardiovascular: Negative for chest pain, palpitations and leg swelling.  Gastrointestinal: Positive for nausea and vomiting. Negative for abdominal pain and  diarrhea.  Genitourinary: Negative for urgency, frequency and difficulty urinating.  Musculoskeletal: Negative for back pain and arthralgias.  Skin: Negative for rash and wound.  Neurological: Positive for tremors. Negative for weakness and headaches.  Psychiatric/Behavioral: Negative for confusion and agitation.    Allergies  Amitriptyline hcl and Doxycycline hyclate  Home Medications   Current Outpatient Rx  Name  Route  Sig  Dispense  Refill  . amLODipine (NORVASC) 10 MG tablet   Oral   Take 10 mg by mouth daily.         Marland Kitchen amoxicillin-clavulanate (AUGMENTIN) 875-125 MG per tablet   Oral   Take 1 tablet by mouth 2 (two) times daily.         Marland Kitchen amylase-lipase-protease (PANGESTYME EC) 20-4.5-25 MU per capsule   Oral   Take 1 capsule by mouth 3 (three) times daily with meals.          . diclofenac (VOLTAREN) 75 MG EC tablet   Oral   Take 75 mg by mouth 2 (two) times daily.         . diclofenac sodium (VOLTAREN) 1 % GEL   Topical   Apply 2 g topically 4 (four) times daily as needed.   100 g   0   . FLUoxetine (PROZAC) 10 MG capsule   Oral   Take 10 mg by mouth daily.          . folic acid (FOLVITE) 1 MG tablet      TAKE 1 TABLET (1 MG TOTAL) BY MOUTH DAILY.   30 tablet   1   . hydrochlorothiazide (HYDRODIURIL) 25 MG tablet   Oral   Take 25 mg by mouth daily.         Marland Kitchen ibuprofen (ADVIL,MOTRIN) 200 MG tablet   Oral   Take 100 mg by mouth every 6 (six) hours as needed for pain.         . magnesium oxide (MAG-OX) 400 MG tablet   Oral   Take 2 tablets (800 mg total) by mouth 2 (two) times daily.   120 tablet   2   . Multiple Vitamin (MULITIVITAMIN WITH MINERALS) TABS   Oral   Take 1 tablet by mouth daily.   30 tablet   11   . omeprazole (PRILOSEC) 40 MG capsule   Oral   Take 40 mg by mouth daily.         . ranitidine (ZANTAC) 150 MG tablet   Oral   Take 150 mg by mouth 2 (two) times daily.         Marland Kitchen thiamine  100 MG tablet       TAKE 1 TABLET (100 MG TOTAL) BY MOUTH DAILY.   30 tablet   1   . gabapentin (NEURONTIN) 300 MG capsule   Oral   Take 2 capsules (600 mg total) by mouth 3 (three) times daily.   90 capsule   2   . levETIRAcetam (KEPPRA) 500 MG tablet   Oral   Take 500 mg by mouth 2 (two) times daily.         . potassium chloride SA (K-DUR,KLOR-CON) 20 MEQ tablet   Oral   Take 1 tablet (20 mEq total) by mouth 2 (two) times daily.   6 tablet   0   . Vitamin D, Ergocalciferol, (DRISDOL) 50000 UNITS CAPS      TAKE 1 CAPSULE (50,000 UNITS TOTAL) BY MOUTH ONCE A WEEK.   4 capsule   0     BP 112/75  Pulse 85  Temp(Src) 97.9 F (36.6 C) (Oral)  SpO2 100%  Physical Exam  Nursing note and vitals reviewed. Constitutional: She is oriented to person, place, and time. She appears well-developed and well-nourished. No distress.  HENT:  Head: Normocephalic and atraumatic.  Mouth/Throat: Oropharynx is clear and moist.  Eyes: EOM are normal. Pupils are equal, round, and reactive to light.  Neck: Normal range of motion. Neck supple.  Cardiovascular: Normal rate, regular rhythm, normal heart sounds and intact distal pulses.   Pulmonary/Chest: Effort normal and breath sounds normal. She has no wheezes. She has no rales.  Abdominal: Soft. Bowel sounds are normal. She exhibits no distension. There is no tenderness. There is no rebound and no guarding.  Musculoskeletal: Normal range of motion. She exhibits no edema and no tenderness.  Lymphadenopathy:    She has no cervical adenopathy.  Neurological: She is alert and oriented to person, place, and time. She displays normal reflexes. No cranial nerve deficit. She exhibits normal muscle tone. Coordination normal.  Skin: Skin is warm and dry. No rash noted.  Psychiatric: She has a normal mood and affect. Her behavior is normal.    ED Course  Procedures (including critical care time)  Labs Reviewed  COMPREHENSIVE METABOLIC PANEL - Abnormal; Notable for  the following:    Sodium 134 (*)    Potassium 3.1 (*)    CO2 16 (*)    Glucose, Bld 157 (*)    Creatinine, Ser 1.22 (*)    Calcium 6.3 (*)    Total Protein 8.9 (*)    Albumin 2.1 (*)    AST 81 (*)    Alkaline Phosphatase 338 (*)    GFR calc non Af Amer 49 (*)    GFR calc Af Amer 57 (*)    All other components within normal limits  MAGNESIUM - Abnormal; Notable for the following:    Magnesium 0.9 (*)    All other components within normal limits  CBC WITH DIFFERENTIAL - Abnormal; Notable for the following:    RBC 2.93 (*)    Hemoglobin 10.1 (*)    HCT 29.6 (*)    MCV 101.0 (*)    MCH 34.5 (*)    Platelets 56 (*)    Neutrophils Relative 28 (*)    Neutro Abs 1.6 (*)    Lymphocytes Relative 55 (*)    Monocytes Relative 15 (*)    All other components within normal limits  ETHANOL - Abnormal; Notable for the following:    Alcohol, Ethyl (B) 337 (*)    All other  components within normal limits  URINALYSIS, ROUTINE W REFLEX MICROSCOPIC - Abnormal; Notable for the following:    APPearance CLOUDY (*)    Protein, ur 100 (*)    Leukocytes, UA SMALL (*)    All other components within normal limits  URINE MICROSCOPIC-ADD ON - Abnormal; Notable for the following:    Squamous Epithelial / LPF MANY (*)    All other components within normal limits  URINE RAPID DRUG SCREEN (HOSP PERFORMED)   No results found.   1. Alcohol abuse with intoxication   2. Hypokalemia   3. Hypomagnesemia       MDM  51F with pmhx of alcohol abuse here because she wants detoxification. Reports no drug use. She has history of pancreatitis but denies abdominal pain. Her last drink was 3 days ago and reports some nausea, vomiting, and tremors. Exam as noted above. Vital appear stable. Will obtain labs and ensure medical clearance and then call ACT.  Labs reveal hypokalemia and hypomagnesemia. She had one episode of vomiting here and was given Zofran ODT and she then tolerated food. ACT has seen patient and  she is not a candidate for detox. She will f/u with [facility] later this month in which she already has an appointment. Given rx for potassium and told to take magnesium supplements such as Maalox. Felt stable for d/c home. Return precautions given.      Johnnette Gourd, MD 07/16/12 [phone]

## 2012-07-16 NOTE — ED Notes (Signed)
Give meal per Radford Pax MD

## 2012-07-16 NOTE — ED Notes (Signed)
Meal ordered

## 2012-07-16 NOTE — ED Notes (Signed)
IV Team unsuccessful. Radford Pax MD notified
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
## 2012-07-16 NOTE — ED Notes (Signed)
Pt here requesting detox from ETOH abuse.  Pt currently appers intoxicated, however she is ambulating without problems. Family members at bedside.

## 2012-07-18 ENCOUNTER — Ambulatory Visit (HOSPITAL_COMMUNITY): Payer: PRIVATE HEALTH INSURANCE

## 2012-07-19 NOTE — ED Provider Notes (Signed)
I saw and evaluated the patient, reviewed the resident's note and I agree with the findings and plan.   .Face to face Exam:  General:  Awake HEENT:  Atraumatic Resp:  Normal effort Abd:  Nondistended Neuro:No focal weakness Lymph: No adenopathy   Nelia Shi, MD 07/19/12 1128

## 2012-08-04 ENCOUNTER — Encounter (HOSPITAL_BASED_OUTPATIENT_CLINIC_OR_DEPARTMENT_OTHER): Payer: Self-pay | Admitting: *Deleted

## 2012-08-04 ENCOUNTER — Emergency Department (HOSPITAL_BASED_OUTPATIENT_CLINIC_OR_DEPARTMENT_OTHER)
Admission: EM | Admit: 2012-08-04 | Discharge: 2012-08-04 | Disposition: A | Discharge status: Home / Self Care | Payer: PRIVATE HEALTH INSURANCE | Attending: Emergency Medicine | Admitting: Emergency Medicine

## 2012-08-04 DIAGNOSIS — Z8719 Personal history of other diseases of the digestive system: Secondary | ICD-10-CM | POA: Insufficient documentation

## 2012-08-04 DIAGNOSIS — Z8669 Personal history of other diseases of the nervous system and sense organs: Secondary | ICD-10-CM | POA: Insufficient documentation

## 2012-08-04 DIAGNOSIS — Z792 Long term (current) use of antibiotics: Secondary | ICD-10-CM | POA: Insufficient documentation

## 2012-08-04 DIAGNOSIS — Z8709 Personal history of other diseases of the respiratory system: Secondary | ICD-10-CM | POA: Insufficient documentation

## 2012-08-04 DIAGNOSIS — R609 Edema, unspecified: Secondary | ICD-10-CM | POA: Insufficient documentation

## 2012-08-04 DIAGNOSIS — Z8781 Personal history of (healed) traumatic fracture: Secondary | ICD-10-CM | POA: Insufficient documentation

## 2012-08-04 DIAGNOSIS — I10 Essential (primary) hypertension: Secondary | ICD-10-CM | POA: Insufficient documentation

## 2012-08-04 DIAGNOSIS — Z8639 Personal history of other endocrine, nutritional and metabolic disease: Secondary | ICD-10-CM | POA: Insufficient documentation

## 2012-08-04 DIAGNOSIS — F3289 Other specified depressive episodes: Secondary | ICD-10-CM | POA: Insufficient documentation

## 2012-08-04 DIAGNOSIS — Z8739 Personal history of other diseases of the musculoskeletal system and connective tissue: Secondary | ICD-10-CM | POA: Insufficient documentation

## 2012-08-04 DIAGNOSIS — Z87828 Personal history of other (healed) physical injury and trauma: Secondary | ICD-10-CM | POA: Insufficient documentation

## 2012-08-04 DIAGNOSIS — Z862 Personal history of diseases of the blood and blood-forming organs and certain disorders involving the immune mechanism: Secondary | ICD-10-CM | POA: Insufficient documentation

## 2012-08-04 DIAGNOSIS — K219 Gastro-esophageal reflux disease without esophagitis: Secondary | ICD-10-CM | POA: Insufficient documentation

## 2012-08-04 DIAGNOSIS — Z87891 Personal history of nicotine dependence: Secondary | ICD-10-CM | POA: Insufficient documentation

## 2012-08-04 DIAGNOSIS — Z8611 Personal history of tuberculosis: Secondary | ICD-10-CM | POA: Insufficient documentation

## 2012-08-04 DIAGNOSIS — Z8742 Personal history of other diseases of the female genital tract: Secondary | ICD-10-CM | POA: Insufficient documentation

## 2012-08-04 DIAGNOSIS — Z8619 Personal history of other infectious and parasitic diseases: Secondary | ICD-10-CM | POA: Insufficient documentation

## 2012-08-04 DIAGNOSIS — Z79899 Other long term (current) drug therapy: Secondary | ICD-10-CM | POA: Insufficient documentation

## 2012-08-04 DIAGNOSIS — Z8701 Personal history of pneumonia (recurrent): Secondary | ICD-10-CM | POA: Insufficient documentation

## 2012-08-04 DIAGNOSIS — E875 Hyperkalemia: Secondary | ICD-10-CM | POA: Insufficient documentation

## 2012-08-04 DIAGNOSIS — R197 Diarrhea, unspecified: Secondary | ICD-10-CM | POA: Insufficient documentation

## 2012-08-04 LAB — CBC WITH DIFFERENTIAL/PLATELET
HCT: 25.3 % — ABNORMAL LOW (ref 36.0–46.0)
Hemoglobin: 8.1 g/dL — ABNORMAL LOW (ref 12.0–15.0)
Lymphocytes Relative: 29 % (ref 12–46)
Monocytes Absolute: 2.8 10*3/uL — ABNORMAL HIGH (ref 0.1–1.0)
Monocytes Relative: 28 % — ABNORMAL HIGH (ref 3–12)
Neutro Abs: 4.2 10*3/uL (ref 1.7–7.7)
RBC: 2.4 MIL/uL — ABNORMAL LOW (ref 3.87–5.11)
WBC: 9.9 10*3/uL (ref 4.0–10.5)

## 2012-08-04 LAB — COMPREHENSIVE METABOLIC PANEL
Albumin: 1.8 g/dL — ABNORMAL LOW (ref 3.5–5.2)
Alkaline Phosphatase: 192 U/L — ABNORMAL HIGH (ref 39–117)
BUN: 24 mg/dL — ABNORMAL HIGH (ref 6–23)
CO2: 20 mEq/L (ref 19–32)
Chloride: 107 mEq/L (ref 96–112)
GFR calc non Af Amer: 38 mL/min — ABNORMAL LOW (ref >90)
Potassium: 5.6 mEq/L — ABNORMAL HIGH (ref 3.5–5.1)
Total Bilirubin: 0.2 mg/dL — ABNORMAL LOW (ref 0.3–1.2)

## 2012-08-04 NOTE — ED Notes (Signed)
Pt came to ED from [facility]. C/O leg pain and swelling x 3 days.

## 2012-08-04 NOTE — ED Provider Notes (Signed)
History  This chart was scribed for [name] B. Bernette Mayers, MD by Shari Heritage, ED Scribe. The patient was seen in room MH11/MH11. Patient's care was started at 1622.   CSN: [id number]  Arrival date & time 08/04/12  1549   First MD Initiated Contact with Patient 08/04/12 1622      Chief Complaint  Patient presents with  . Leg Pain    The history is provided by the patient. No language interpreter was used.    HPI Comments: [name] [name] is a 57 y.o. female who presents to the Emergency Department from [facility] complaining of constant, non-radiating bilateral posterior leg pain onset 3 days ago. She denies fever, vomiting or difficulty urinating. Patient reports persistent diarrhea, but this is not a new problem. Patient says that she has been walking with a cane for the past few days due to this new leg pain. She has been at [facility] for the past 6 days for alcohol abuse treatment. She states that she has been eating well and maintaining good nutrition.    Past Medical History  Diagnosis Date  . Anemia, B12 deficiency   . History of acute pancreatitis   . Right knee pain     No recent imaging on chart  . 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 Dr. Su Hilt, [facility]  . Hypertriglyceridemia   . GERD (gastroesophageal reflux disease)   . Vitamin D deficiency   . Subdural hematoma 02/2008    Likely 2/2 trauma from seizure from EtOH withdrawal, chronic in nature, sees Dr. Robyne Askew. Most recent CT head 10/2009 showing stable but persistent hematoma without mass effect.  . History of seizure disorder     Likely 2/2 alcohol abuse  . Hypocalcemia   . Hypomagnesemia   . Failure to thrive in childhood     Unclear etiology  . HTN (hypertension)   . Thrombocytopenia   . Anemia, macrocytic   . Hepatomegaly     On exam  . Alcohol abuse   . Joint pain   . Alcohol abuse   . Arthritis   . Vitamin D deficiency   .  Menopause   . Pancreatitis   . Insomnia   . Hyperlipidemia   . Sinusitis   . Pernicious anemia   . Subdural hematoma   . Macrocytic anemia   . Hepatomegaly   . 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 Seizures     1.5 YRS  LAST ONE  . Depression   . Hepatitis     HEPATOMEGALY   . Fx humeral neck 04/17/2011    Transverse fracture- minimally displaced- managed as outpatient   . ABNORMAL PAP SMEAR, LGSIL 07/23/2008    Annotation: HPV positive CIN I Dr. Su Hilt, [facility] Qualifier: Diagnosis of  By: Danae Chen    . Pneumonia 05/20/2012    Past Surgical History  Procedure Laterality Date  . Cesarean section  1983  . Esophagogastroduodenoscopy  07/11/2011    Procedure: ESOPHAGOGASTRODUODENOSCOPY (EGD);  Surgeon: Theda Belfast, MD;  Location: [facility];  Service: Endoscopy;  Laterality: N/A;  . Colonoscopy  07/11/2011    Procedure: COLONOSCOPY;  Surgeon: Theda Belfast, MD;  Location: [facility];  Service: Endoscopy;  Laterality: N/A;  . Eye surgery      LEFT EYE YRS AGO   . Rt colectomy  08/28/2011    Family History  Problem Relation Age of  Onset  . Cancer Mother     Died from stomach cancer and "flesh eating rash  . Heart failure Father     Died in 66s from an MI  . Alcohol abuse Sister     Twin sister drinks a lot, as did both her parents and brothers  . Stroke Brother     Has 7 brothers, 1 with CVA    History  Substance Use Topics  . Smoking status: Former Smoker    Types: Cigarettes    Quit date: 09/20/2010  . Smokeless tobacco: Never Used  . Alcohol Use: Yes     Comment: 1-2 glasses wine a day    OB History   Grav Para Term Preterm Abortions TAB SAB Ect Mult Living                  Review of Systems A complete 10 system review of systems was obtained and all systems are negative except as noted in the HPI and PMH.   Allergies  Amitriptyline hcl and Doxycycline hyclate  Home Medications   Current Outpatient Rx  Name  Route   Sig  Dispense  Refill  . amLODipine (NORVASC) 10 MG tablet   Oral   Take 10 mg by mouth daily.         Marland Kitchen amoxicillin-clavulanate (AUGMENTIN) 875-125 MG per tablet   Oral   Take 1 tablet by mouth 2 (two) times daily.         Marland Kitchen amylase-lipase-protease (PANGESTYME EC) 20-4.5-25 MU per capsule   Oral   Take 1 capsule by mouth 3 (three) times daily with meals.          . diclofenac (VOLTAREN) 75 MG EC tablet   Oral   Take 75 mg by mouth 2 (two) times daily.         . diclofenac sodium (VOLTAREN) 1 % GEL   Topical   Apply 2 g topically 4 (four) times daily as needed.   100 g   0   . FLUoxetine (PROZAC) 10 MG capsule   Oral   Take 10 mg by mouth daily.          . folic acid (FOLVITE) 1 MG tablet      TAKE 1 TABLET (1 MG TOTAL) BY MOUTH DAILY.   30 tablet   1   . gabapentin (NEURONTIN) 300 MG capsule   Oral   Take 2 capsules (600 mg total) by mouth 3 (three) times daily.   90 capsule   2   . hydrochlorothiazide (HYDRODIURIL) 25 MG tablet   Oral   Take 25 mg by mouth daily.         Marland Kitchen ibuprofen (ADVIL,MOTRIN) 200 MG tablet   Oral   Take 100 mg by mouth every 6 (six) hours as needed for pain.         Marland Kitchen levETIRAcetam (KEPPRA) 500 MG tablet   Oral   Take 500 mg by mouth 2 (two) times daily.         . magnesium oxide (MAG-OX) 400 MG tablet   Oral   Take 2 tablets (800 mg total) by mouth 2 (two) times daily.   120 tablet   2   . Multiple Vitamin (MULITIVITAMIN WITH MINERALS) TABS   Oral   Take 1 tablet by mouth daily.   30 tablet   11   . omeprazole (PRILOSEC) 40 MG capsule   Oral   Take 40 mg by mouth daily.         Marland Kitchen  potassium chloride SA (K-DUR,KLOR-CON) 20 MEQ tablet   Oral   Take 1 tablet (20 mEq total) by mouth 2 (two) times daily.   6 tablet   0   . ranitidine (ZANTAC) 150 MG tablet   Oral   Take 150 mg by mouth 2 (two) times daily.         Marland Kitchen thiamine 100 MG tablet      TAKE 1 TABLET (100 MG TOTAL) BY MOUTH DAILY.   30  tablet   1   . Vitamin D, Ergocalciferol, (DRISDOL) 50000 UNITS CAPS      TAKE 1 CAPSULE (50,000 UNITS TOTAL) BY MOUTH ONCE A WEEK.   4 capsule   0     Triage Vitals: BP 109/76  Pulse 112  Temp(Src) 98.4 F (36.9 C) (Oral)  Resp 20  Ht 5\' 1"  (1.549 m)  Wt 87 lb (39.463 kg)  BMI 16.45 kg/m2  SpO2 100%  Physical Exam  Nursing note and vitals reviewed. Constitutional: She is oriented to person, place, and time. She appears well-developed and well-nourished.  HENT:  Head: Normocephalic and atraumatic.  Eyes: EOM are normal. Pupils are equal, round, and reactive to light.  Neck: Normal range of motion. Neck supple.  Cardiovascular: Normal rate, normal heart sounds and intact distal pulses.   Pulmonary/Chest: Effort normal and breath sounds normal.  Abdominal: Bowel sounds are normal. She exhibits no distension. There is no tenderness.  Musculoskeletal: Normal range of motion. She exhibits edema. She exhibits no tenderness.  1+ edema to bilateral lower extremities, equal from mid shin to foot.   Neurological: She is alert and oriented to person, place, and time. She has normal strength. No cranial nerve deficit or sensory deficit.  Skin: Skin is warm and dry. No rash noted.  Psychiatric: She has a normal mood and affect.    ED Course  Procedures (including critical care time) DIAGNOSTIC STUDIES: Oxygen Saturation is 100% on room air, normal by my interpretation.    COORDINATION OF CARE: 4:35 PM- Patient informed of current plan for treatment and evaluation and agrees with plan at this time.    Labs Reviewed  COMPREHENSIVE METABOLIC PANEL - Abnormal; Notable for the following:    Potassium 5.6 (*)    Glucose, Bld 106 (*)    BUN 24 (*)    Creatinine, Ser 1.50 (*)    Calcium 8.3 (*)    Albumin 1.8 (*)    Alkaline Phosphatase 192 (*)    Total Bilirubin 0.2 (*)    GFR calc non Af Amer 38 (*)    GFR calc Af Amer 44 (*)    All other components within normal limits  CBC  WITH DIFFERENTIAL - Abnormal; Notable for the following:    RBC 2.40 (*)    Hemoglobin 8.1 (*)    HCT 25.3 (*)    MCV 105.4 (*)    Neutrophils Relative 42 (*)    Monocytes Relative 28 (*)    Monocytes Absolute 2.8 (*)    All other components within normal limits  MAGNESIUM    No results found.   No diagnosis found.    MDM  Labs show hypoalbuminemia which is likely the cause of her mild peripheral edema. Mild increase in Cr above baseline, but does not appear to be clinically significant. Mild hyperkalemia, advised to stop supplemental K. PCP follow up.       I personally performed the services described in this documentation, which was scribed in my presence. The  recorded information has been reviewed and is accurate.     [name] B. Bernette Mayers, MD 08/04/12 [phone]

## 2012-08-07 ENCOUNTER — Encounter: Payer: Self-pay | Admitting: Internal Medicine

## 2012-08-07 ENCOUNTER — Inpatient Hospital Stay (HOSPITAL_COMMUNITY)
Admission: AD | Admit: 2012-08-07 | Discharge: 2012-08-07 | Disposition: A | Discharge status: Home / Self Care | Payer: PRIVATE HEALTH INSURANCE | Source: Ambulatory Visit | Attending: Internal Medicine | Admitting: Internal Medicine

## 2012-08-07 ENCOUNTER — Observation Stay (HOSPITAL_COMMUNITY)
Admission: AD | Admit: 2012-08-07 | Discharge: 2012-08-09 | Disposition: A | Discharge status: Home / Self Care | Payer: PRIVATE HEALTH INSURANCE | Source: Ambulatory Visit | Attending: Internal Medicine | Admitting: Internal Medicine

## 2012-08-07 ENCOUNTER — Ambulatory Visit (INDEPENDENT_AMBULATORY_CARE_PROVIDER_SITE_OTHER): Payer: PRIVATE HEALTH INSURANCE | Admitting: Internal Medicine

## 2012-08-07 ENCOUNTER — Inpatient Hospital Stay (HOSPITAL_COMMUNITY): Payer: PRIVATE HEALTH INSURANCE

## 2012-08-07 VITALS — BP 116/77 | HR 92 | Temp 96.3°F | Ht 61.0 in | Wt 99.4 lb

## 2012-08-07 DIAGNOSIS — R609 Edema, unspecified: Secondary | ICD-10-CM

## 2012-08-07 DIAGNOSIS — E875 Hyperkalemia: Secondary | ICD-10-CM | POA: Diagnosis present

## 2012-08-07 DIAGNOSIS — R600 Localized edema: Secondary | ICD-10-CM

## 2012-08-07 DIAGNOSIS — E8770 Fluid overload, unspecified: Secondary | ICD-10-CM

## 2012-08-07 DIAGNOSIS — E8779 Other fluid overload: Principal | ICD-10-CM | POA: Insufficient documentation

## 2012-08-07 DIAGNOSIS — F1010 Alcohol abuse, uncomplicated: Secondary | ICD-10-CM | POA: Diagnosis present

## 2012-08-07 DIAGNOSIS — F1021 Alcohol dependence, in remission: Secondary | ICD-10-CM | POA: Insufficient documentation

## 2012-08-07 DIAGNOSIS — D649 Anemia, unspecified: Secondary | ICD-10-CM

## 2012-08-07 DIAGNOSIS — N184 Chronic kidney disease, stage 4 (severe): Secondary | ICD-10-CM | POA: Diagnosis present

## 2012-08-07 DIAGNOSIS — I10 Essential (primary) hypertension: Secondary | ICD-10-CM | POA: Insufficient documentation

## 2012-08-07 DIAGNOSIS — N179 Acute kidney failure, unspecified: Secondary | ICD-10-CM

## 2012-08-07 DIAGNOSIS — R188 Other ascites: Secondary | ICD-10-CM | POA: Insufficient documentation

## 2012-08-07 DIAGNOSIS — D539 Nutritional anemia, unspecified: Secondary | ICD-10-CM

## 2012-08-07 DIAGNOSIS — G40909 Epilepsy, unspecified, not intractable, without status epilepticus: Secondary | ICD-10-CM | POA: Diagnosis present

## 2012-08-07 LAB — COMPREHENSIVE METABOLIC PANEL
ALT: 10 U/L (ref 0–35)
Albumin: 2.1 g/dL — ABNORMAL LOW (ref 3.5–5.2)
CO2: 19 mEq/L (ref 19–32)
Calcium: 9.1 mg/dL (ref 8.4–10.5)
Chloride: 103 mEq/L (ref 96–112)
Creat: 1.39 mg/dL — ABNORMAL HIGH (ref 0.50–1.10)
Potassium: 5.9 mEq/L — ABNORMAL HIGH (ref 3.5–5.3)

## 2012-08-07 LAB — CBC WITH DIFFERENTIAL/PLATELET
Eosinophils Relative: 1 % (ref 0–5)
HCT: 25.5 % — ABNORMAL LOW (ref 36.0–46.0)
Lymphocytes Relative: 33 % (ref 12–46)
Lymphs Abs: 2.8 10*3/uL (ref 0.7–4.0)
MCV: 101.2 fL — ABNORMAL HIGH (ref 78.0–100.0)
Monocytes Absolute: 1.2 10*3/uL — ABNORMAL HIGH (ref 0.1–1.0)
Neutro Abs: 4.4 10*3/uL (ref 1.7–7.7)
Platelets: 278 10*3/uL (ref 150–400)
RBC: 2.52 MIL/uL — ABNORMAL LOW (ref 3.87–5.11)
WBC: 8.6 10*3/uL (ref 4.0–10.5)

## 2012-08-07 LAB — TROPONIN I: Troponin I: <0.3 ng/mL (ref <0.30)

## 2012-08-07 LAB — BASIC METABOLIC PANEL
CO2: 18 mEq/L — ABNORMAL LOW (ref 19–32)
Chloride: 108 mEq/L (ref 96–112)
Potassium: 5.4 mEq/L — ABNORMAL HIGH (ref 3.5–5.1)
Sodium: 134 mEq/L — ABNORMAL LOW (ref 135–145)

## 2012-08-07 LAB — PRO B NATRIURETIC PEPTIDE: Pro B Natriuretic peptide (BNP): 1210 pg/mL — ABNORMAL HIGH (ref 0–125)

## 2012-08-07 MED ORDER — ACETAMINOPHEN 650 MG RE SUPP: 650.0000 mg | Freq: Four times a day (QID) | RECTAL | Status: DC | PRN

## 2012-08-07 MED ORDER — ACETAMINOPHEN 325 MG PO TABS: 650.0000 mg | ORAL_TABLET | Freq: Four times a day (QID) | ORAL | Status: DC | PRN

## 2012-08-07 MED ORDER — LEVETIRACETAM 500 MG PO TABS
500.0000 mg | ORAL_TABLET | Freq: Two times a day (BID) | ORAL | Status: DC
  Administered 2012-08-07 – 2012-08-09 (×5): 500 mg via ORAL
  Filled 2012-08-07 (×5): qty 1

## 2012-08-07 MED ORDER — HYDROCHLOROTHIAZIDE 25 MG PO TABS
25.0000 mg | ORAL_TABLET | Freq: Every day | ORAL | Status: DC
  Administered 2012-08-08 – 2012-08-09 (×2): 25 mg via ORAL
  Filled 2012-08-07 (×2): qty 1

## 2012-08-07 MED ORDER — ADULT MULTIVITAMIN W/MINERALS CH
1.0000 | ORAL_TABLET | Freq: Every day | ORAL | Status: DC
  Administered 2012-08-08 – 2012-08-09 (×2): 1 via ORAL
  Filled 2012-08-07 (×2): qty 1

## 2012-08-07 MED ORDER — SODIUM CHLORIDE 0.9 % IJ SOLN: 3.0000 mL | INTRAMUSCULAR | Status: DC | PRN

## 2012-08-07 MED ORDER — AMLODIPINE BESYLATE 10 MG PO TABS
10.0000 mg | ORAL_TABLET | Freq: Every day | ORAL | Status: DC
  Administered 2012-08-07 – 2012-08-09 (×3): 10 mg via ORAL
  Filled 2012-08-07 (×3): qty 1

## 2012-08-07 MED ORDER — FLUOXETINE HCL 10 MG PO CAPS
10.0000 mg | ORAL_CAPSULE | Freq: Every day | ORAL | Status: DC
  Administered 2012-08-08 – 2012-08-09 (×2): 10 mg via ORAL
  Filled 2012-08-07 (×3): qty 1

## 2012-08-07 MED ORDER — SODIUM CHLORIDE 0.9 % IJ SOLN
3.0000 mL | Freq: Two times a day (BID) | INTRAMUSCULAR | Status: DC
  Administered 2012-08-08 (×2): 3 mL via INTRAVENOUS

## 2012-08-07 MED ORDER — CYANOCOBALAMIN 250 MCG PO TABS
250.0000 ug | ORAL_TABLET | Freq: Every day | ORAL | Status: DC
  Administered 2012-08-08 – 2012-08-09 (×2): 250 ug via ORAL
  Filled 2012-08-07 (×3): qty 1

## 2012-08-07 MED ORDER — SODIUM CHLORIDE 0.9 % IV SOLN: 250.0000 mL | INTRAVENOUS | Status: DC | PRN

## 2012-08-07 MED ORDER — GABAPENTIN 300 MG PO CAPS
600.0000 mg | ORAL_CAPSULE | Freq: Three times a day (TID) | ORAL | Status: DC
  Administered 2012-08-07 – 2012-08-09 (×6): 600 mg via ORAL
  Filled 2012-08-07 (×7): qty 2

## 2012-08-07 MED ORDER — PANTOPRAZOLE SODIUM 40 MG PO TBEC
40.0000 mg | DELAYED_RELEASE_TABLET | Freq: Every day | ORAL | Status: DC
  Administered 2012-08-08 – 2012-08-09 (×2): 40 mg via ORAL
  Filled 2012-08-07 (×3): qty 1

## 2012-08-07 MED ORDER — PANCRELIPASE (LIP-PROT-AMYL) 12000-38000 UNITS PO CPEP
1.0000 | ORAL_CAPSULE | Freq: Three times a day (TID) | ORAL | Status: DC
  Administered 2012-08-08 – 2012-08-09 (×6): 1 via ORAL
  Filled 2012-08-07 (×7): qty 1

## 2012-08-07 MED ORDER — SODIUM POLYSTYRENE SULFONATE 15 GM/60ML PO SUSP
15.0000 g | Freq: Once | ORAL | Status: AC
  Administered 2012-08-07: 15 g via ORAL
  Filled 2012-08-07: qty 60

## 2012-08-07 MED ORDER — ASPIRIN EC 81 MG PO TBEC
81.0000 mg | DELAYED_RELEASE_TABLET | Freq: Every day | ORAL | Status: DC
  Administered 2012-08-07 – 2012-08-09 (×3): 81 mg via ORAL
  Filled 2012-08-07 (×3): qty 1

## 2012-08-07 MED ORDER — AMYLASE-LIPASE-PROTEASE 20-4.5-25 MU PO CPEP: 1.0000 | ORAL_CAPSULE | Freq: Three times a day (TID) | ORAL | Status: DC

## 2012-08-07 MED ORDER — VITAMIN B-1 100 MG PO TABS
100.0000 mg | ORAL_TABLET | Freq: Every day | ORAL | Status: DC
  Administered 2012-08-08 – 2012-08-09 (×2): 100 mg via ORAL
  Filled 2012-08-07 (×3): qty 1

## 2012-08-07 MED ORDER — HEPARIN SODIUM (PORCINE) 5000 UNIT/ML IJ SOLN
5000.0000 [IU] | Freq: Three times a day (TID) | INTRAMUSCULAR | Status: DC
  Administered 2012-08-07 – 2012-08-09 (×5): 5000 [IU] via SUBCUTANEOUS
  Filled 2012-08-07 (×8): qty 1

## 2012-08-07 NOTE — H&P (Signed)
Hospital Admission Note Date: 08/07/2012  Patient name: [name] [name] Medical record number: [id number] Date of birth: 13-Feb-1956 Age: 57 y.o. Gender: female PCP: Lars Mage, MD  Medical Service: Internal Medicine Teaching Service  Attending physician:  Dr. Eben Burow   1st Contact:  Dr. Garald Braver  Pager:[phone] 2nd Contact:  Dr. Clyde Lundborg   [phone] After 5 pm or weekends: 1st Contact:      Pager: [phone] 2nd Contact:      Pager: [phone]  Chief Complaint: Swelling and pain of bilateral feet  History of Present Illness: [name] [name] is a 57 year old woman with PMH of HTN, chronic alcohol abuse (currently in detox at [facility] in [facility] over the past two weeks), and macrocytic anemia who presented to the [facility] today for evaluation of bilateral feet swelling that has progressively worsened for the past 4 days. She also describes abdominal distention for one day and soft stools for one week. She has increased appetite and states that they are feeding her good foods at [facility]. . She was seen in the ED on 08/04/12 for the leg pain but was discharged back to [facility]. During that visit she was found to be hyperkalemic with K of 5.6, and anemic with Hgb of 8.1, with worsening renal function with Creatinine of 1.5. She reports that since her ED visit she has continued to take potassium supplementation daily. She has difficulty walking due to the bilateral leg pain and feet swelling. She reports almost a 20lb weight gain in the past two weeks. She denies ever having similar problems in the past. She denies history of blood clots, recent immobilization, or trauma to her legs. Her last alcoholic drink was two weeks ago.   She denies confusion, headache, chest pain, shortness of breath, fever/chills, decreased appetite, increased somnolence, abdominal pain, urinary frequency or retention, urine color changes, diarrhea, or constipation.    Meds: Medications Prior to Admission  Medication Sig Dispense Refill   . amLODipine (NORVASC) 10 MG tablet Take 10 mg by mouth daily.      Marland Kitchen amylase-lipase-protease (PANGESTYME EC) 20-4.5-25 MU per capsule Take 1 capsule by mouth 3 (three) times daily with meals.       Marland Kitchen FLUoxetine (PROZAC) 10 MG capsule Take 10 mg by mouth daily.       Marland Kitchen gabapentin (NEURONTIN) 300 MG capsule Take 2 capsules (600 mg total) by mouth 3 (three) times daily.  90 capsule  2  . hydrochlorothiazide (HYDRODIURIL) 25 MG tablet Take 25 mg by mouth daily.      Marland Kitchen levETIRAcetam (KEPPRA) 500 MG tablet Take 500 mg by mouth 2 (two) times daily.      . Multiple Vitamin (MULITIVITAMIN WITH MINERALS) TABS Take 1 tablet by mouth daily.  30 tablet  11  . omeprazole (PRILOSEC) 40 MG capsule Take 40 mg by mouth daily.      . [DISCONTINUED] folic acid (FOLVITE) 1 MG tablet TAKE 1 TABLET (1 MG TOTAL) BY MOUTH DAILY.  30 tablet  1  . [DISCONTINUED] thiamine 100 MG tablet TAKE 1 TABLET (100 MG TOTAL) BY MOUTH DAILY.  30 tablet  1   Allergies: Allergies as of 08/07/2012 - Review Complete 08/07/2012  Allergen Reaction Noted  . Amitriptyline hcl Swelling   . Doxycycline hyclate Itching 11/10/2010   Past Medical History  Diagnosis Date  . Anemia, B12 deficiency   . History of acute pancreatitis   . Right knee pain     No recent imaging on chart  . Abnormal  Pap smear and cervical HPV (human papillomavirus)     CN1. LGSIL-HPV positive. Dr. Su Hilt, [facility]  . Hypertriglyceridemia   . GERD (gastroesophageal reflux disease)   . Vitamin D deficiency   . Subdural hematoma 02/2008    Likely 2/2 trauma from seizure from EtOH withdrawal, chronic in nature, sees Dr. Robyne Askew. Most recent CT head 10/2009 showing stable but persistent hematoma without mass effect.  . History of seizure disorder     Likely 2/2 alcohol abuse  . Hypocalcemia   . Hypomagnesemia   . Failure to thrive in childhood     Unclear etiology  . HTN (hypertension)   . Thrombocytopenia   . Anemia, macrocytic   .  Hepatomegaly     On exam  . Alcohol abuse   . Joint pain   . Alcohol abuse   . Arthritis   . Vitamin D deficiency   . Menopause   . Pancreatitis   . Insomnia   . Hyperlipidemia   . Sinusitis   . Pernicious anemia   . Subdural hematoma   . Macrocytic anemia   . Hepatomegaly   . 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 Seizures     1.5 YRS  LAST ONE  . Depression   . Hepatitis     HEPATOMEGALY   . Fx humeral neck 04/17/2011    Transverse fracture- minimally displaced- managed as outpatient   . ABNORMAL PAP SMEAR, LGSIL 07/23/2008    Annotation: HPV positive CIN I Dr. Su Hilt, [facility] Qualifier: Diagnosis of  By: Danae Chen    . Pneumonia 05/20/2012   Past Surgical History  Procedure Laterality Date  . Cesarean section  1983  . Esophagogastroduodenoscopy  07/11/2011    Procedure: ESOPHAGOGASTRODUODENOSCOPY (EGD);  Surgeon: Theda Belfast, MD;  Location: [facility];  Service: Endoscopy;  Laterality: N/A;  . Colonoscopy  07/11/2011    Procedure: COLONOSCOPY;  Surgeon: Theda Belfast, MD;  Location: [facility];  Service: Endoscopy;  Laterality: N/A;  . Eye surgery      LEFT EYE YRS AGO   . Rt colectomy  08/28/2011   Family History  Problem Relation Age of Onset  . Cancer Mother     Died from stomach cancer and "flesh eating rash  . Heart failure Father     Died in 34s from an MI  . Alcohol abuse Sister     Twin sister drinks a lot, as did both her parents and brothers  . Stroke Brother     Has 7 brothers, 1 with CVA   History   Social History  . Marital Status: Divorced    Spouse Name: N/A    Number of Children: N/A  . Years of Education: N/A   Occupational History  . Not on file.   Social History Main Topics  . Smoking status: Former Smoker    Types: Cigarettes    Quit date: 09/20/2010  . Smokeless tobacco: Never Used  . Alcohol Use: No     Comment: At [facility]. NO ALCOHOL X2 WEEKS  . Drug Use: No     Comment: HX  USE   .  Sexually Active: Not Currently   Other Topics Concern  . Not on file   Social History Narrative   Lives with her significant other and 2 grandchildren. 1 child   Has 7 brothers and 4 sisters, 1 twin sister.   Unemployed, worked in [employer].  Abuses alcohol-drinks 1 glass of wine daily    No drug use. Former cigarette use quit 1.5 years ago.     11 th grade education             Review of Systems: Pertinent items are noted in HPI.  Physical Exam: Blood pressure 122/80, pulse 81, temperature 97.5 F (36.4 C), temperature source Oral, resp. rate 18, height 5\' 1"  (1.549 m), weight 100 lb 1.4 oz (45.4 kg), SpO2 100.00%. Vitals reviewed. General: Sitting up in bed, in NAD. She is thin but with diffuse facial puffiness, no lip swelling.  HEENT: PERRL, EOMI, no scleral icterus. Periorbital swelling.  Cardiac: RRR, no rubs, murmurs or gallops. JVD to 4 cm Pulm: clear to auscultation bilaterally, no wheezes, rales, or rhonchi Abd: Firm, distended, nontender, BS present GU: no CVA tenderness Ext: warm and well perfused, b/l LE 2+ pitting edema up to her knees, b/l feet with mild erythema, TTP. Bilateral calf tender to palpation, with no palpable cord.  Skin: tense, shiny over b/l LE. Warm, dry, intact, no rash or cyanosis.  Neuro: alert and oriented X3, cranial nerves II-XII grossly intact, strength and sensation to light touch equal in bilateral upper and lower extremities Psych: Calm, cooperative to exam, mood and appropiate  Lab results: Basic Metabolic Panel:  Recent Labs  45/40/98 1521  NA 133*  K 5.9*  CL 103  CO2 19  GLUCOSE 85  BUN 26*  CREATININE 1.39*  CALCIUM 9.1   Liver Function Tests:  Recent Labs  08/07/12 1521  AST 32  ALT 10  ALKPHOS 258*  BILITOT 0.1*  PROT 8.6*  ALBUMIN 2.1*   CBC:  Recent Labs  08/07/12 1521  WBC 8.6  NEUTROABS 4.4  HGB 8.3*  HCT 25.5*  MCV 101.2*  PLT 278   Urine Drug Screen: Drugs of Abuse     Component Value  Date/Time   LABOPIA NONE DETECTED 07/16/2012 0948   LABOPIA NEG 09/18/2011 0936   COCAINSCRNUR NONE DETECTED 07/16/2012 0948   COCAINSCRNUR NEG 09/18/2011 0936   LABBENZ NONE DETECTED 07/16/2012 0948   LABBENZ NEG 09/18/2011 0936   LABBENZ NEG 04/10/2011 1130   AMPHETMU NONE DETECTED 07/16/2012 0948   AMPHETMU NEG 04/10/2011 1130   THCU NONE DETECTED 07/16/2012 0948   LABBARB NONE DETECTED 07/16/2012 0948   LABBARB NEG 09/18/2011 0936      Other results: EKG: NSR, no ST elevation or depression  Assessment & Plan by Problem:  Bilateral Lower Extremity Edema. Etiology unclear. No signs of infectious etiology with no fever of leukocytosis. Differential to include bilateral DVT, hypervolemia (due to either acute CHF or ARF), or liver disease (alk phos elevated to 258), hypoalbuminemia, nephrotic syndrome, MM, or medication induced (no new medications). Currently her O2 saturation is stable at 100% on RA.   -Admit to telemetry -B/L LE doppler US -Daily weight  -2 view CXR -2D echo -pro-BNP -troponin x1 -daily CMP -Will consider Lasix IV if patient decompensates  Ascites. Likely secondary to volume overload although liver disease. Alk phos elevated, history of alcohol abuse.  -Will consider abdominal US   Hyperkalemia. Unclear etiology. EKG with no peaked T waves. K of 5.6 two days ago, 5.9 today.  -Kayexalate once  -Repeat BMET this evening -Mg level  Acute renal failure. Baseline of 0.8. Creatinine is steadily increased to 1.5 on 3/2, down to 1.39 today. Unclear if preneal, intrarenal, postrenal. No history of renal disease. Protein discrepancy noted, this could also be secondary to  multiple myeloma -Renal US.  -UA -Urine Na and Cr -Will consider UPEP and UPEP as part of MM work up  Alcohol abuse. Patient currently undergoing detoxification with last drink two weeks ago. No active withdrawal.  -MV daily -Thiamine daily -B12 tablets daily  Seizure disorder. Checking Kepra level.  Continue kepra home dose.   Dispo: Disposition is deferred at this time, awaiting improvement of current medical problems. Anticipated discharge in approximately 2-3 day(s).   The patient does have a current PCP (GARG, ANKIT, MD), therefore will be requiring OPC follow-up after discharge.   The patient does have transportation limitations that hinder transportation to clinic appointments.  SignedKy Barban 08/07/2012, 7:51 PM

## 2012-08-07 NOTE — Assessment & Plan Note (Addendum)
Unknown etiology.  10+ weight gain over past 3 days.  Today's weight 99lbs, usually ~80s. Chronic alcoholic.  Presented with acute lower extremity edema and abdominal distention.  -urine studies, protein -?CHF--recommend echo -admit to inpatient

## 2012-08-07 NOTE — Progress Notes (Addendum)
Subjective:   [name] female   DOB: 1955-08-16 57 y.o.   MRN: [id number]  HPI: [name] [name] is a 57 y.o. African American female with PMH of HTN (well controlled), chronic alcohol abuse (has been in rehab facility over the past two weeks), and macrocytic anemia presenting to clinic today for complaints of worsening lower extremity edema x4 days.  [name] [name] was last seen in the ED on 08/04/12 for the leg pain but sent home the same day.  At that time she was found to be hyperkalemic with K 5.6, anemic with Hb 8.1, and worsening renal function with Cr 1.5, and Mag 1.6.  Today, she claims the pain and swelling is worse and she has difficulty walking.  She also complains of abdominal distension, weight gain, and flatulence.  She denies any chest pain, or shortness of breath, fever, chills, N/V/D, abdominal pain, or any urinary complaints at this time.  She was last seen by her PCP 07/2012.  Her weight at that time was noted to be 82 lbs and today is 99lbs. In the ED on 3/2 her weight was noted to be 87lbs.    She has not drank alcohol for approximately two weeks, no longer smokes cigarettes, and claims to have not used cocaine for over a year.  Last uds 07/16/12 was negative.  She denies any recent travel or trauma.    EKG done in clinic: HR 79bpm NSR, nonspecific t wave inversion noted in aVL, ?poor r wave progression.   Stat cbc and cmet ordered in clinic.  Hb stable at 8.3 with MCV 101.2.  CMET still pending.    Past Medical History  Diagnosis Date  . Anemia, B12 deficiency   . History of acute pancreatitis   . Right knee pain     No recent imaging on chart  . 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 Dr. Su Hilt, [facility]  . Hypertriglyceridemia   . GERD (gastroesophageal reflux disease)   . Vitamin D deficiency   . Subdural hematoma 02/2008    Likely 2/2 trauma from seizure from EtOH withdrawal,  chronic in nature, sees Dr. Robyne Askew. Most recent CT head 10/2009 showing stable but persistent hematoma without mass effect.  . History of seizure disorder     Likely 2/2 alcohol abuse  . Hypocalcemia   . Hypomagnesemia   . Failure to thrive in childhood     Unclear etiology  . HTN (hypertension)   . Thrombocytopenia   . Anemia, macrocytic   . Hepatomegaly     On exam  . Alcohol abuse   . Joint pain   . Alcohol abuse   . Arthritis   . Vitamin D deficiency   . Menopause   . Pancreatitis   . Insomnia   . Hyperlipidemia   . Sinusitis   . Pernicious anemia   . Subdural hematoma   . Macrocytic anemia   . Hepatomegaly   . 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 Seizures     1.5 YRS  LAST ONE  . Depression   . Hepatitis     HEPATOMEGALY   . Fx humeral neck 04/17/2011    Transverse fracture- minimally displaced- managed as outpatient   . ABNORMAL PAP SMEAR, LGSIL 07/23/2008    Annotation: HPV positive CIN I Dr. Su Hilt, [facility] Qualifier: Diagnosis of  By: Danae Chen    .  Pneumonia 05/20/2012   Current Outpatient Prescriptions  Medication Sig Dispense Refill  . amLODipine (NORVASC) 10 MG tablet Take 10 mg by mouth daily.      Marland Kitchen amoxicillin-clavulanate (AUGMENTIN) 875-125 MG per tablet Take 1 tablet by mouth 2 (two) times daily.      Marland Kitchen amylase-lipase-protease (PANGESTYME EC) 20-4.5-25 MU per capsule Take 1 capsule by mouth 3 (three) times daily with meals.       . folic acid (FOLVITE) 1 MG tablet TAKE 1 TABLET (1 MG TOTAL) BY MOUTH DAILY.  30 tablet  1  . gabapentin (NEURONTIN) 300 MG capsule Take 2 capsules (600 mg total) by mouth 3 (three) times daily.  90 capsule  2  . hydrochlorothiazide (HYDRODIURIL) 25 MG tablet Take 25 mg by mouth daily.      Marland Kitchen ibuprofen (ADVIL,MOTRIN) 200 MG tablet Take 100 mg by mouth every 6 (six) hours as needed for pain.      Marland Kitchen levETIRAcetam (KEPPRA) 500 MG tablet Take 500 mg by mouth 2 (two) times daily.      . magnesium  oxide (MAG-OX) 400 MG tablet Take 2 tablets (800 mg total) by mouth 2 (two) times daily.  120 tablet  2  . Multiple Vitamin (MULITIVITAMIN WITH MINERALS) TABS Take 1 tablet by mouth daily.  30 tablet  11  . omeprazole (PRILOSEC) 40 MG capsule Take 40 mg by mouth daily.      . Vitamin D, Ergocalciferol, (DRISDOL) 50000 UNITS CAPS TAKE 1 CAPSULE (50,000 UNITS TOTAL) BY MOUTH ONCE A WEEK.  4 capsule  0  . diclofenac (VOLTAREN) 75 MG EC tablet Take 75 mg by mouth 2 (two) times daily.      . diclofenac sodium (VOLTAREN) 1 % GEL Apply 2 g topically 4 (four) times daily as needed.  100 g  0  . FLUoxetine (PROZAC) 10 MG capsule Take 10 mg by mouth daily.       . ranitidine (ZANTAC) 150 MG tablet Take 150 mg by mouth 2 (two) times daily.      Marland Kitchen thiamine 100 MG tablet TAKE 1 TABLET (100 MG TOTAL) BY MOUTH DAILY.  30 tablet  1   No current facility-administered medications for this visit.   Family History  Problem Relation Age of Onset  . Cancer Mother     Died from stomach cancer and "flesh eating rash  . Heart failure Father     Died in 66s from an MI  . Alcohol abuse Sister     Twin sister drinks a lot, as did both her parents and brothers  . Stroke Brother     Has 7 brothers, 1 with CVA   History   Social History  . Marital Status: Divorced    Spouse Name: N/A    Number of Children: N/A  . Years of Education: N/A   Social History Main Topics  . Smoking status: Former Smoker    Types: Cigarettes    Quit date: 09/20/2010  . Smokeless tobacco: Never Used  . Alcohol Use: No     Comment: At [facility].  . Drug Use: No     Comment: HX  USE   . Sexually Active: Not on file   Other Topics Concern  . Not on file   Social History Narrative   Lives with her significant other and 2 grandchildren. 1 child   Has 7 brothers and 4 sisters, 1 twin sister.   Unemployed, worked in [employer].    Abuses alcohol-drinks 1  glass of wine daily    No drug use. Former cigarette use quit 1.5 years ago.      11 th grade education            Review of Systems: Constitutional: Denies fever, chills, diaphoresis, appetite change and fatigue.  HEENT: Denies photophobia, eye pain, redness, hearing loss, ear pain, congestion, sore throat, rhinorrhea, sneezing, mouth sores, trouble swallowing, neck pain, neck stiffness and tinnitus.   Respiratory: Denies SOB, DOE, cough, chest tightness,  and wheezing.   Cardiovascular: Denies chest pain, palpitations and leg swelling.  Gastrointestinal: Gas and distention. Denies nausea, vomiting, abdominal pain, diarrhea, constipation, blood in stool.   Genitourinary: Denies dysuria, urgency, frequency, hematuria, flank pain and difficulty urinating.  Musculoskeletal: B/L lower extremity pain and edema.  Skin: Denies pallor, rash and wound.  Neurological: Denies dizziness, seizures, syncope, weakness, light-headedness, numbness and headaches.  Hematological: Denies adenopathy. Easy bruising, personal or family bleeding history  Psychiatric/Behavioral: Denies suicidal ideation, mood changes, confusion, nervousness, sleep disturbance and agitation  Objective:  Physical Exam: Filed Vitals:   08/07/12 1413  BP: 116/77  Pulse: 92  Temp: 96.3 F (35.7 C)  TempSrc: Oral  Height: 5\' 1"  (1.549 m)  Weight: 99 lb 6.4 oz (45.088 kg)  SpO2: 100%   Constitutional: Vital signs reviewed.  Patient is a thing female in no acute distress and cooperative with exam. Alert and oriented x3.  Head: Normocephalic and atraumatic Ear: TM normal bilaterally Mouth: no erythema or exudates, MMM Eyes: PERRL, EOMI, conjunctivae normal Neck: Supple, Trachea midline normal ROM,  Cardiovascular: RRR, S1 normal, S2 normal, no MRG, pulses symmetric and intact bilaterally Pulmonary/Chest: CTAB, no wheezes, rales, or rhonchi Abdominal: Soft. Distended.  Non-tender, bowel sounds are normal, no masses, organomegaly, or guarding present.  GU: no CVA tenderness Musculoskeletal: b/l lower  extremity +2 pitting edema extending up to the knees, erythema of b/l feet, tender to palpation.   Hematology: no cervical adenopathy.  Neurological: A&O x3, Strength 4/5 b/l lower extremities.  Strength is normal and symmetric bilateral upper extremities, cranial nerve II-XII are grossly intact, no focal motor deficit, sensory intact to light touch bilaterally.  Skin: Warm, dry and intact. No rash, cyanosis, or clubbing. Shiny lower extremities.  Psychiatric: Normal mood and affect. speech and behavior is normal. Judgment and thought content normal. Cognition and memory are normal.   Assessment & Plan:  Discussed with Dr. Criselda Peaches  Will admit to IMTS for fluid overload 12lb weight gain in 3 days, b/l lower extremity edema, hyperkalemia, and AKI (per labs 3/2).  Current CMET still pending.   Could not give urine sample while in clinic will need one upon admission.  Will need follow up cxr since PNA in 05/2012 this admission.

## 2012-08-07 NOTE — Assessment & Plan Note (Signed)
K 5.9 today elevated from 3/2 ED visit.  Hx of hypokalemia.  May be taking K supplements although is not in our med list but is commented on by ED physician on last visit.  EKG done no peaked t waves.   -f/u CMET

## 2012-08-07 NOTE — Assessment & Plan Note (Signed)
Cr. 1.5 on 3/2 and down to 1.39 today.  Unknown etiology for acute renal injury.  10+ pound weight gain over 3 days and presenting with b/l lower extremity edema and abdominal distention.    -U/A and protein -admitting to IMTS

## 2012-08-07 NOTE — Progress Notes (Addendum)
Received pt from inpatient clinic at 0650,Patient alert and oriented X 3.to person,place and time,Pupils sluggish ,speech is clear ,able to follow command,100% sao2 at room air,Pleasant and cooperative.

## 2012-08-07 NOTE — Assessment & Plan Note (Signed)
Hb 8.3 today with MCV 101.2 in setting of chronic alcoholism.  Claims to take Thiamine and Folate and has not drank any alcohol for past two weeks while in rehab.    -repeat CBC today -continue vitamins

## 2012-08-07 NOTE — Assessment & Plan Note (Addendum)
Acute, x4 days, worsening, pitting edema 2+ up to knees, tender to palpation.  Was seen in ED 3/2 with no intervention at that time.  Claims it has gotten worse since then and now difficult to even walk and stand. Associated with weight gain 10+ pounds in 3 days.  Denies shortness of breath or chest pain and any similar episodes.  -?etiology -will admit to IMTS -? B/l DVT

## 2012-08-07 NOTE — Assessment & Plan Note (Signed)
Has not drank for past two weeks.  Went to rehab and currently at [facility] facility  -continue alcohol cessation and counseling given -continue to stay for rehab at [facility] -f/u CMET

## 2012-08-08 ENCOUNTER — Inpatient Hospital Stay (HOSPITAL_COMMUNITY): Payer: PRIVATE HEALTH INSURANCE

## 2012-08-08 ENCOUNTER — Encounter (HOSPITAL_COMMUNITY): Payer: Self-pay | Admitting: *Deleted

## 2012-08-08 DIAGNOSIS — I509 Heart failure, unspecified: Secondary | ICD-10-CM

## 2012-08-08 LAB — CBC
HCT: 21.3 % — ABNORMAL LOW (ref 36.0–46.0)
MCHC: 34.3 g/dL (ref 30.0–36.0)
MCV: 99.5 fL (ref 78.0–100.0)
RDW: 13.2 % (ref 11.5–15.5)

## 2012-08-08 LAB — BASIC METABOLIC PANEL
BUN: 26 mg/dL — ABNORMAL HIGH (ref 6–23)
CO2: 19 mEq/L (ref 19–32)
CO2: 16 mEq/L — ABNORMAL LOW (ref 19–32)
Chloride: 108 mEq/L (ref 96–112)
Chloride: 105 mEq/L (ref 96–112)
Creatinine, Ser: 1.52 mg/dL — ABNORMAL HIGH (ref 0.50–1.10)
Creatinine, Ser: 1.33 mg/dL — ABNORMAL HIGH (ref 0.50–1.10)
Creatinine, Ser: 1.46 mg/dL — ABNORMAL HIGH (ref 0.50–1.10)
GFR calc Af Amer: 43 mL/min — ABNORMAL LOW (ref >90)
GFR calc non Af Amer: 38 mL/min — ABNORMAL LOW (ref >90)
Potassium: 5.4 mEq/L — ABNORMAL HIGH (ref 3.5–5.1)
Potassium: 5.8 mEq/L — ABNORMAL HIGH (ref 3.5–5.1)
Sodium: 132 mEq/L — ABNORMAL LOW (ref 135–145)

## 2012-08-08 LAB — T3, FREE: T3, Free: 1.8 pg/mL — ABNORMAL LOW (ref 2.3–4.2)

## 2012-08-08 LAB — PROTIME-INR: INR: 1.15 (ref 0.00–1.49)

## 2012-08-08 LAB — CREATININE, URINE, RANDOM: Creatinine, Urine: 39.45 mg/dL

## 2012-08-08 LAB — URINALYSIS, ROUTINE W REFLEX MICROSCOPIC
Bilirubin Urine: NEGATIVE
Hgb urine dipstick: NEGATIVE
Protein, ur: NEGATIVE mg/dL
Specific Gravity, Urine: 1.013 (ref 1.005–1.030)
Urobilinogen, UA: 0.2 mg/dL (ref 0.0–1.0)

## 2012-08-08 MED ORDER — SODIUM CHLORIDE 0.9 % IV BOLUS (SEPSIS): 250.0000 mL | Freq: Once | INTRAVENOUS | Status: DC

## 2012-08-08 MED ORDER — FUROSEMIDE 40 MG PO TABS
40.0000 mg | ORAL_TABLET | Freq: Two times a day (BID) | ORAL | Status: DC
  Administered 2012-08-08 – 2012-08-09 (×4): 40 mg via ORAL
  Filled 2012-08-08 (×6): qty 1

## 2012-08-08 MED ORDER — SODIUM POLYSTYRENE SULFONATE 15 GM/60ML PO SUSP
15.0000 g | Freq: Once | ORAL | Status: AC
Start: 2012-08-08 — End: 2012-08-08
  Administered 2012-08-08: 15 g via ORAL
  Filled 2012-08-08: qty 60

## 2012-08-08 NOTE — Care Management Note (Signed)
    Page 1 of 1   08/09/2012     2:29:41 PM   CARE MANAGEMENT NOTE 08/09/2012  Patient:  [name] [name], [name] [name]   Account Number:  [id number]  Date Initiated:  08/08/2012  Documentation initiated by:  Letha Cape  Subjective/Objective Assessment:   dx le edema  admit- from [facility]     Action/Plan:   Anticipated DC Date:  08/09/2012   Anticipated DC Plan:  HOME/SELF CARE  In-house referral  Clinical Social Worker      DC Planning Services  CM consult      Choice offered to / List presented to:             Status of service:  Completed, signed off Medicare Important Message given?   (If response is "NO", the following Medicare IM given date fields will be blank) Date Medicare IM given:   Date Additional Medicare IM given:    Discharge Disposition:  HOME/SELF CARE  Per UR Regulation:  Reviewed for med. necessity/level of care/duration of stay  If discussed at Long Length of Stay Meetings, dates discussed:    Comments:  08/09/12 14:28 Letha Cape RN, BSN [phone] patient is for discharge today and she will f/u with [facility] per CSW.  08/08/12 16:39 Letha Cape RN, BSN [phone] patient is from [facility], patient with abd distention and new dx of multiple myeloma, for 2 d echo for possible dc tomorrow.

## 2012-08-08 NOTE — Progress Notes (Signed)
Subjective: She still has swelling of her face, abdomen, feet, and legs. She is able to walk. She denies shortness of breath, chest pain, abdominal pain, decreased urinary frequency or diarrhea.   Objective: Vital signs in last 24 hours: Filed Vitals:   08/07/12 2209 08/08/12 0500 08/08/12 0600 08/08/12 0955  BP: 105/65  100/62 126/81  Pulse: 106  104   Temp: 97.7 F (36.5 C)  98.4 F (36.9 C)   TempSrc: Oral  Oral   Resp: 18  20   Height: 5\' 1"  (1.549 m)     Weight: 101 lb 3.1 oz (45.9 kg) 100 lb 1.4 oz (45.4 kg)    SpO2: 100%  100%    Weight change:   Intake/Output Summary (Last 24 hours) at 08/08/12 1150 Last data filed at 08/08/12 1006  Gross per 24 hour  Intake    481 ml  Output    952 ml  Net   -471 ml   Vitals reviewed.  General: Sitting up in bed, in NAD. She is thin but with diffuse facial puffiness, no lip swelling.  HEENT: PERRL, EOMI, no scleral icterus. Periorbital swelling.  Cardiac: RRR, no rubs, murmurs or gallops. JVD to 4 cm  Pulm: bibasilar crackles, no wheezes, rales, or rhonchi  Abd: Firm, distended, nontender, BS present  GU: no CVA tenderness  Ext: warm and well perfused, b/l LE 2+ pitting edema up to her knees, b/l feet with mild erythema, TTP. Bilateral calf tender to palpation, with no palpable cord.  Skin: tense, shiny over b/l LE. Warm, dry, intact, no rash or cyanosis.  Neuro: alert and oriented X3, cranial nerves II-XII grossly intact, strength and sensation to light touch equal in bilateral upper and lower extremities  Psych: Calm, cooperative to exam, mood and affect appropiate  Lab Results: Basic Metabolic Panel:  Recent Labs Lab 08/04/12 1634  08/07/12 2231 08/07/12 2232 08/08/12 0530  NA 135  < >  --  134* 137  K 5.6*  < >  --  5.4* 5.7*  CL 107  < >  --  108 110  CO2 20  < >  --  18* 19  GLUCOSE 106*  < >  --  143* 89  BUN 24*  < >  --  27* 26*  CREATININE 1.50*  < >  --  1.48* 1.52*  CALCIUM 8.3*  < >  --  8.3* 7.9*  MG 1.6   --  2.0  --   --   < > = values in this interval not displayed. Liver Function Tests:  Recent Labs Lab 08/04/12 1634 08/07/12 1521  AST 25 32  ALT 8 10  ALKPHOS 192* 258*  BILITOT 0.2* 0.1*  PROT 7.1 8.6*  ALBUMIN 1.8* 2.1*   CBC:  Recent Labs Lab 08/04/12 1634 08/07/12 1521 08/08/12 0530  WBC 9.9 8.6 7.2  NEUTROABS 4.2 4.4  --   HGB 8.1* 8.3* 7.3*  HCT 25.3* 25.5* 21.3*  MCV 105.4* 101.2* 99.5  PLT 185 278 213   Cardiac Enzymes:  Recent Labs Lab 08/07/12 2242  TROPONINI <0.30   BNP:  Recent Labs Lab 08/07/12 2242  PROBNP 1210.0*   Urine Drug Screen: Drugs of Abuse     Component Value Date/Time   LABOPIA NONE DETECTED 07/16/2012 0948   LABOPIA NEG 09/18/2011 0936   COCAINSCRNUR NONE DETECTED 07/16/2012 0948   COCAINSCRNUR NEG 09/18/2011 0936   LABBENZ NONE DETECTED 07/16/2012 0948   LABBENZ NEG 09/18/2011 0936   LABBENZ  NEG 04/10/2011 1130   AMPHETMU NONE DETECTED 07/16/2012 0948   AMPHETMU NEG 04/10/2011 1130   THCU NONE DETECTED 07/16/2012 0948   LABBARB NONE DETECTED 07/16/2012 0948   LABBARB NEG 09/18/2011 0936    Urinalysis:  Recent Labs Lab 08/08/12 0216  COLORURINE YELLOW  LABSPEC 1.013  PHURINE 7.0  GLUCOSEU NEGATIVE  HGBUR NEGATIVE  BILIRUBINUR NEGATIVE  KETONESUR NEGATIVE  PROTEINUR NEGATIVE  UROBILINOGEN 0.2  NITRITE NEGATIVE  LEUKOCYTESUR NEGATIVE    Micro Results: No results found for this or any previous visit (from the past 240 hour(s)). Studies/Results: Dg Chest 2 View  08/07/2012  *RADIOLOGY REPORT*  Clinical Data: Swelling in the lower extremities.  CHEST - 2 VIEW  Comparison: 05/20/2012.  Findings: The heart, mediastinum and hilar contours are normal. There has been interval resolution of infiltrates in the lungs as described on the preceding study.  The lungs are now clear.  There is an increase in chronic reticular changes without additional focal abnormalities.  There are no effusions or pneumothoraces. There are no acute bony  changes.  IMPRESSION: No active disease   Original Report Authenticated By: Sander Radon, M.D.    Medications: I have reviewed the patient's current medications. Scheduled Meds: . amLODipine  10 mg Oral Daily  . aspirin EC  81 mg Oral Daily  . FLUoxetine  10 mg Oral Daily  . furosemide  40 mg Oral BID  . gabapentin  600 mg Oral TID  . heparin  5,000 Units Subcutaneous Q8H  . hydrochlorothiazide  25 mg Oral Daily  . levETIRAcetam  500 mg Oral BID  . lipase/protease/amylase  1 capsule Oral TID WC  . multivitamin with minerals  1 tablet Oral Daily  . pantoprazole  40 mg Oral Daily  . sodium chloride  3 mL Intravenous Q12H  . thiamine  100 mg Oral Daily  . vitamin B-12  250 mcg Oral Daily   Continuous Infusions:  PRN Meds:.sodium chloride, acetaminophen, acetaminophen, sodium chloride Assessment/Plan:  Anasarca with Bilateral Lower Extremity Edema. Etiology unclear. No signs of infectious etiology with no fever of leukocytosis. Differential to include volume overload from CHF (right sided HF) or ARF with nephrotic syndrome. Her UA is negative for protein but she has a protein gap of 6.5, making multiple myeloma a possible cause of nephrotic syndrome and anasarca. Pro-BNP elevated to 1,000. Currently her O2 saturation is stable at 100% on RA. CXR with no PNA or pulmonary edema. Her physical exam is changed with new bibasilar lung crackles.  -Continue telemetry -Discontinued B/L LE doppler US, likelihood of bilateral DVT very low  -Daily weight  -2D echo  -daily CMP  -Start Lasix 40mg  PO BID (she is Lasix naive)  Ascites. Likely secondary to volume overload although liver disease. Alk phos elevated, history of alcohol abuse.  -Will consider abdominal US pending 2D echo results  Hyperkalemia. Unclear etiology. EKG with no peaked T waves. K of 5.6 two days ago, 5.9 on presentation. K of 5.7 this AM. Mg normal at 2.0. -Kayexalate once last evening and once this AM.  -Repeat BMET this  evening    Acute renal failure. Baseline of 0.8. Creatinine is steadily increased to 1.5 on 3/2, down to 1.39 on presentation, up to 1.5 today. Unclear if preneal, intrarenal, postrenal. No history of renal disease. Protein discrepancy noted, this could also be secondary to multiple myeloma. UA with no proteinuria.  -Renal US ordered.  -UPEP/ SPEP  Alcohol abuse. Patient currently undergoing detoxification with last drink two  weeks ago. No active withdrawal.  -MV daily  -Thiamine daily  -B12 tablets daily   Seizure disorder. Checking Kepra level. Continue kepra home dose.   Dispo: Disposition is deferred at this time, awaiting improvement of current medical problems. Anticipated discharge in approximately 2-3 day(s).   The patient does have a current PCP (GARG, ANKIT, MD), therefore will be requiring OPC follow-up after discharge.  The patient does have transportation limitations that hinder transportation to clinic appointments.   .Services Needed at time of discharge: Y = Yes, Blank = No PT:   OT:   RN:   Equipment:   Other:     LOS: 1 day   [name] [name] D 08/08/2012, 11:50 AM

## 2012-08-08 NOTE — Progress Notes (Signed)
  Echocardiogram 2D Echocardiogram has been performed.  [name] [name] 08/08/2012, 2:43 PM

## 2012-08-08 NOTE — H&P (Addendum)
Internal Medicine Attending Admission Note Date: 08/08/2012  Patient name: [name] [name] Medical record number: [id number] Date of birth: Feb 10, 1956 Age: 57 y.o. Gender: female  I saw and evaluated the patient. I reviewed the resident's note and I agree with the resident's findings and plan as documented in the resident's note.  Chief Complaint(s): Abdominal swelling, leg swelling and leg pain  History - key components related to admission:  Patient is a 57 year old female with past medical history most significant for hypertension, chronic alcohol abuse but currently in detox at [facility] for past 2 weeks was admitted from clinic for further evaluation of anasarca and 20 pound weight gain over last 2 weeks.  Patient was apparently doing well since 2 weeks ago when she started noticing swelling in her feet , Abdomen and face. Swelling gradually progressed to a point where she started having bilateral feet pain. Patient denies any chest pain, shortness of breath, headaches, confusion, fever, chills, decreased appetite, change in mental status, weakness, decreased urinary frequency, change in bowel movements or change in medications.   15 point review of systems is negative except what is noted above.  Physical Exam - key components related to admission:  Filed Vitals:   08/07/12 2209 08/08/12 0500 08/08/12 0600 08/08/12 0955  BP: 105/65  100/62 126/81  Pulse: 106  104   Temp: 97.7 F (36.5 C)  98.4 F (36.9 C)   TempSrc: Oral  Oral   Resp: 18  20   Height: 5\' 1"  (1.549 m)     Weight: 101 lb 3.1 oz (45.9 kg) 100 lb 1.4 oz (45.4 kg)    SpO2: 100%  100%    Physical Exam: General: Vital signs reviewed and noted. Well-developed, well-nourished, in no acute distress; alert, appropriate and cooperative throughout examination.  Neck: No deformities, masses, or tenderness noted.Supple, No carotid Bruits, 11-12 cm JVD with no variation with breathing  Lungs:  Normal respiratory effort. Patient  had bilateral basilar crackles right more than left,  without wheezes.  Heart:  tachycardic but regular . S1 and S2 normal without murmur, or rubs.S3 gallop  present  Abdomen:  BS normoactive. Soft, distended due to fluid, non-tender. Liver border was about 4 fingers below right costal margin, shifting dullness present, umbilicus was inverted   Extremities:  3+ pitting edema bilaterally up to mid thigh   Neurologic: A&O X3, CN II - XII are grossly intact. Motor strength is 5/5 in the all 4 extremities, Sensations intact to light touch, Cerebellar signs negative.  Skin: No visible rashes, scars.    Lab results:   Basic Metabolic Panel:  Recent Labs  56/21/30 2231  08/08/12 0530 08/08/12 1142  NA  --   < > 137 137  K  --   < > 5.7* 5.4*  CL  --   < > 110 108  CO2  --   < > 19 19  GLUCOSE  --   < > 89 103*  BUN  --   < > 26* 24*  CREATININE  --   < > 1.52* 1.33*  CALCIUM  --   < > 7.9* 8.4  MG 2.0  --   --   --   < > = values in this interval not displayed. Liver Function Tests:  Recent Labs  08/07/12 1521  AST 32  ALT 10  ALKPHOS 258*  BILITOT 0.1*  PROT 8.6*  ALBUMIN 2.1*   CBC:  Recent Labs  08/07/12 1521 08/08/12 0530  WBC 8.6  7.2  NEUTROABS 4.4  --   HGB 8.3* 7.3*  HCT 25.5* 21.3*  MCV 101.2* 99.5  PLT 278 213   Cardiac Enzymes:  Recent Labs  08/07/12 2242  TROPONINI <0.30    Coagulation:  Recent Labs  08/08/12 1142  INR 1.15   Imaging results:  Dg Chest 2 View  08/07/2012  *RADIOLOGY REPORT*  Clinical Data: Swelling in the lower extremities.  CHEST - 2 VIEW  Comparison: 05/20/2012.  Findings: The heart, mediastinum and hilar contours are normal. There has been interval resolution of infiltrates in the lungs as described on the preceding study.  The lungs are now clear.  There is an increase in chronic reticular changes without additional focal abnormalities.  There are no effusions or pneumothoraces. There are no acute bony changes.  IMPRESSION:  No active disease   Original Report Authenticated By: Sander Radon, M.D.    US Renal  08/08/2012  *RADIOLOGY REPORT*  Clinical Data: Evaluate for possible hydronephrosis.  RENAL/URINARY TRACT ULTRASOUND COMPLETE  Comparison:  CT abdomen pelvis 07/13/2011 and ultrasound abdomen 08/16/2007.  Findings:  Right Kidney:  Measures 10.3 cm.  Parenchymal echogenicity is increased.  Trace pelvocaliectasis.  No focal lesions.  Left Kidney:  Measures 11.1 cm.  Parenchymal echogenicity is increased.  Trace pelvocaliectasis.  No focal lesions.  Bladder:  Ureteral jets are identified bilaterally.  IMPRESSION:  1.  Trace bilateral pelvocaliectasis.  No overt hydronephrosis. 2.  Increased renal parenchymal echogenicity is indicative of chronic medical renal disease.   Original Report Authenticated By: Leanna Battles, M.D.     Other results: EKG: 106 beats per minute, sinus rhythm, normal axis, Q waves noted in V1, V2, V3 which was present on previous EKG done on 06/02/2012  Assessment & Plan by Problem:  Principal Problem:   Bilateral lower extremity edema Active Problems:   ALCOHOL ABUSE   Seizure disorder   AKI (acute kidney injury)   Hyperkalemia   Fluid overload  Patient is a 57 year old female with past medical history most significant for hypertension, ongoing alcohol abuse who has been in a detox facility for last 2 weeks now presents to the clinic with acute 20 pound weight gain and anasarca. Differentials for anasarca include right heart failure and pericardial diseases, liver cirrhosis, nephrotic syndrome, and drug-induced edema.  Patient's urinalysis does not show any proteinuria which makes nephrotic syndrome unlikely.  Patient's liver enzymes are normal and synthetic function as evaluated by a bilirubin and INR is also normal this does not represent liver cirrhosis.  Patient does have elevated protein gap of about 6.5 which along with renal failure and elevated alkaline phosphatase makes  multiple myeloma as probable differential. Patient does not have elevated calcium. We will order SPEP, UPEP and immunofixation to look for the diagnosis.  Given that patient has elevated JVD, S3 gallop and anasarca, right-sided heart failure and pericardial diseases are on top of the differential. We will obtain a 2-D echocardiogram and start diuresing the patient with Lasix.   High-output cardiac failure with thiamine deficiency could be a probability and we'll check thiamine levels at this time. Usually these patients presents with high-output cardiac failure and given that patient does not have shortness of breath, this seems less likely. Patient was also hyperthyroid back in December, I will recheck TSH, T3 and T4 to assess thyroid function.  Will consider starting on BB and other related therapies if echo comes back negative for pericardial diseases and heart failure to treat hyperthyroidism.  We will continue  to support and correct electrolyte abnormalities.  Patient is currently observation pending 2-D echocardiogram results. Once the results are out, we will decide the disposition.  Lars Mage MD Faculty-Internal Medicine Residency Program Pager [phone]

## 2012-08-08 NOTE — Progress Notes (Signed)
Report was given to nurse and pt was taken to Room 5509 via w/c at 6:45PM. Debra Ditzler RN 08/07/12 7PM

## 2012-08-09 LAB — CBC
HCT: 24.5 % — ABNORMAL LOW (ref 36.0–46.0)
Hemoglobin: 8.4 g/dL — ABNORMAL LOW (ref 12.0–15.0)
MCV: 98.4 fL (ref 78.0–100.0)
RBC: 2.49 MIL/uL — ABNORMAL LOW (ref 3.87–5.11)
WBC: 7.6 10*3/uL (ref 4.0–10.5)

## 2012-08-09 LAB — BASIC METABOLIC PANEL
BUN: 24 mg/dL — ABNORMAL HIGH (ref 6–23)
Chloride: 105 mEq/L (ref 96–112)
GFR calc Af Amer: 45 mL/min — ABNORMAL LOW (ref >90)
Potassium: 4.5 mEq/L (ref 3.5–5.1)

## 2012-08-09 MED ORDER — FUROSEMIDE 40 MG PO TABS: 40.0000 mg | ORAL_TABLET | Freq: Two times a day (BID) | ORAL | Status: DC

## 2012-08-09 MED ORDER — FOLIC ACID 1 MG PO TABS: 1.0000 mg | ORAL_TABLET | Freq: Every day | ORAL | Status: DC

## 2012-08-09 MED ORDER — THIAMINE HCL 100 MG PO TABS: 100.0000 mg | ORAL_TABLET | Freq: Every day | ORAL | Status: DC

## 2012-08-09 MED ORDER — CYANOCOBALAMIN 250 MCG PO TABS: 250.0000 ug | ORAL_TABLET | Freq: Every day | ORAL | Status: DC

## 2012-08-09 MED ORDER — SODIUM POLYSTYRENE SULFONATE 15 GM/60ML PO SUSP
15.0000 g | Freq: Once | ORAL | Status: AC
  Administered 2012-08-09: 15 g via ORAL
  Filled 2012-08-09: qty 60
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
## 2012-08-09 NOTE — Progress Notes (Signed)
Doctor had stat EKG ordered. Printed ekg- reading sinus tach. Paged doc twice- awaiting call back
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
## 2012-08-09 NOTE — Progress Notes (Addendum)
Subjective: She denies chest pain, shortness of breath, abdominal pain, N/V,  hematuria, or blood in her stools. Her LE edema is stable. She had increased diuresis overnight with Lasix. She was told by [facility] that she no longer qualifies for they detox program due to her current medical issue or LE edema.  Objective: Vital signs in last 24 hours: Filed Vitals:   08/08/12 2214 08/09/12 0500 08/09/12 0552 08/09/12 1109  BP: 98/60  111/61 128/87  Pulse: 115  112   Temp: 98 F (36.7 C)  98 F (36.7 C)   TempSrc: Oral  Oral   Resp: 20  19   Height:      Weight:  99 lb 6.8 oz (45.1 kg)    SpO2: 98%  97%    Weight change: -10.6 oz (-0.3 kg)  Intake/Output Summary (Last 24 hours) at 08/09/12 1347 Last data filed at 08/09/12 1100  Gross per 24 hour  Intake      0 ml  Output   1401 ml  Net  -1401 ml   Vitals reviewed.  General: Sitting up in bed, in NAD. Facial puffiness has decreased.  HEENT: PERRL, EOMI, no scleral icterus. Periorbital swelling.  Cardiac: RRR, no rubs, murmurs or gallops. JVD to 12 cm  Pulm: fin crackles at left lung base only (improved from yesterday), no wheezes, rales, or rhonchi  Abd: Firm, distended, nontender, BS present  GU: no CVA tenderness  Ext: warm and well perfused, b/l LE 2+ pitting edema up to her knees, b/l feet with mild erythema, TTP. Bilateral calf tender to palpation, with no palpable cord.  Skin: Less tense with skin not as shiny over b/l LE. Warm, dry, intact, no rash or cyanosis.  Neuro: alert and oriented X3, cranial nerves II-XII grossly intact, strength and sensation to light touch equal in bilateral upper and lower extremities  Psych: Calm, cooperative to exam, mood and affect appropiate  Lab Results: Basic Metabolic Panel:  Recent Labs Lab 08/04/12 1634  08/07/12 2231  08/08/12 2149 08/09/12 0625  NA 135  < >  --   < > 132* 137  K 5.6*  < >  --   < > 5.8* 4.5  CL 107  < >  --   < > 105 105  CO2 20  < >  --   < > 16* 20  GLUCOSE  106*  < >  --   < > 150* 74  BUN 24*  < >  --   < > 24* 24*  CREATININE 1.50*  < >  --   < > 1.46* 1.47*  CALCIUM 8.3*  < >  --   < > 7.8* 8.0*  MG 1.6  --  2.0  --   --   --   < > = values in this interval not displayed. Liver Function Tests:  Recent Labs Lab 08/04/12 1634 08/07/12 1521  AST 25 32  ALT 8 10  ALKPHOS 192* 258*  BILITOT 0.2* 0.1*  PROT 7.1 8.6*  ALBUMIN 1.8* 2.1*   CBC:  Recent Labs Lab 08/04/12 1634 08/07/12 1521 08/08/12 0530 08/09/12 0932  WBC 9.9 8.6 7.2 7.6  NEUTROABS 4.2 4.4  --   --   HGB 8.1* 8.3* 7.3* 8.4*  HCT 25.3* 25.5* 21.3* 24.5*  MCV 105.4* 101.2* 99.5 98.4  PLT 185 278 213 250   Cardiac Enzymes:  Recent Labs Lab 08/07/12 2242  TROPONINI <0.30   BNP:  Recent Labs Lab 08/07/12 2242  PROBNP 1210.0*   Thyroid Function Tests:  Recent Labs Lab 08/08/12 1651  TSH 1.190  FREET4 0.71*  T3FREE 1.8*   Coagulation:  Recent Labs Lab 08/08/12 1142  LABPROT 14.5  INR 1.15   Anemia Panel:  Recent Labs Lab 08/09/12 1040  RETICCTPCT 3.0   Urine Drug Screen: Drugs of Abuse     Component Value Date/Time   LABOPIA NONE DETECTED 07/16/2012 0948   LABOPIA NEG 09/18/2011 0936   COCAINSCRNUR NONE DETECTED 07/16/2012 0948   COCAINSCRNUR NEG 09/18/2011 0936   LABBENZ NONE DETECTED 07/16/2012 0948   LABBENZ NEG 09/18/2011 0936   LABBENZ NEG 04/10/2011 1130   AMPHETMU NONE DETECTED 07/16/2012 0948   AMPHETMU NEG 04/10/2011 1130   THCU NONE DETECTED 07/16/2012 0948   LABBARB NONE DETECTED 07/16/2012 0948   LABBARB NEG 09/18/2011 0936    Urinalysis:  Recent Labs Lab 08/08/12 0216  COLORURINE YELLOW  LABSPEC 1.013  PHURINE 7.0  GLUCOSEU NEGATIVE  HGBUR NEGATIVE  BILIRUBINUR NEGATIVE  KETONESUR NEGATIVE  PROTEINUR NEGATIVE  UROBILINOGEN 0.2  NITRITE NEGATIVE  LEUKOCYTESUR NEGATIVE    Micro Results: No results found for this or any previous visit (from the past 240 hour(s)). Studies/Results: Dg Chest 2 View  08/07/2012   *RADIOLOGY REPORT*  Clinical Data: Swelling in the lower extremities.  CHEST - 2 VIEW  Comparison: 05/20/2012.  Findings: The heart, mediastinum and hilar contours are normal. There has been interval resolution of infiltrates in the lungs as described on the preceding study.  The lungs are now clear.  There is an increase in chronic reticular changes without additional focal abnormalities.  There are no effusions or pneumothoraces. There are no acute bony changes.  IMPRESSION: No active disease   Original Report Authenticated By: Sander Radon, M.D.    US Renal  08/08/2012  *RADIOLOGY REPORT*  Clinical Data: Evaluate for possible hydronephrosis.  RENAL/URINARY TRACT ULTRASOUND COMPLETE  Comparison:  CT abdomen pelvis 07/13/2011 and ultrasound abdomen 08/16/2007.  Findings:  Right Kidney:  Measures 10.3 cm.  Parenchymal echogenicity is increased.  Trace pelvocaliectasis.  No focal lesions.  Left Kidney:  Measures 11.1 cm.  Parenchymal echogenicity is increased.  Trace pelvocaliectasis.  No focal lesions.  Bladder:  Ureteral jets are identified bilaterally.  IMPRESSION:  1.  Trace bilateral pelvocaliectasis.  No overt hydronephrosis. 2.  Increased renal parenchymal echogenicity is indicative of chronic medical renal disease.   Original Report Authenticated By: Leanna Battles, M.D.    Medications: I have reviewed the patient's current medications. Scheduled Meds: . amLODipine  10 mg Oral Daily  . aspirin EC  81 mg Oral Daily  . FLUoxetine  10 mg Oral Daily  . furosemide  40 mg Oral BID  . gabapentin  600 mg Oral TID  . heparin  5,000 Units Subcutaneous Q8H  . hydrochlorothiazide  25 mg Oral Daily  . levETIRAcetam  500 mg Oral BID  . lipase/protease/amylase  1 capsule Oral TID WC  . multivitamin with minerals  1 tablet Oral Daily  . pantoprazole  40 mg Oral Daily  . sodium chloride  3 mL Intravenous Q12H  . thiamine  100 mg Oral Daily  . vitamin B-12  250 mcg Oral Daily   Continuous Infusions:   PRN Meds:.sodium chloride, acetaminophen, acetaminophen, sodium chloride Assessment/Plan: Anasarca with Bilateral Lower Extremity Edema. Etiology unclear. No signs of infectious etiology with no fever of leukocytosis. Differential to include volume overload from CHF (right sided HF) or ARF with nephrotic syndrome. Her UA is negative  for protein but she has a protein gap of 6.5, making multiple myeloma a possible cause of nephrotic syndrome and anasarca. Pro-BNP elevated to 1,000. Currently her O2 saturation is stable at 100% on RA. CXR with no PNA or pulmonary edema. Her physical exam is changed with new bibasilar lung crackles. 2D echo with normal EF, no diastolic dysfunction -Continue telemetry  -Discontinued B/L LE doppler US, likelihood of bilateral DVT very low  -Daily weight  -daily CMP  -Start Lasix 40mg  PO BID (she is Lasix naive)   Ascites. Likely secondary to volume overload although liver disease possible. Alk phos elevated, history of alcohol abuse.   Hyperkalemia. Resolved. Unclear etiology. EKG with no peaked T waves. K of 5.6 two days ago, 5.9 on presentation. She received kayexalate x2. K of 4.5 this AM. Mg normal at 2.0.   Acute renal failure. Baseline of 0.8. Creatinine is steadily increased to 1.5 on 3/2, down to 1.39 on presentation, at 1.47 today. Unclear if preneal, intrarenal, postrenal. No history of renal disease. Protein discrepancy noted, this could also be secondary to multiple myeloma. UA with no proteinuria. Renal US with no overt hydronephrosis.   -UPEP/ SPEP pending  Alcohol abuse. Patient currently undergoing detoxification with last drink two weeks ago. No active withdrawal. She was told that [facility] will not take her back secondary to concerns for the current medical problem. Patient to follow up with [facility] CSW for other inpatient rehab options. Inpatient CSW actively working to reestablish care at [facility]. -thiamine level pending -MV daily  -Thiamine daily   -B12 tablets daily   Seizure disorder. Checking Kepra level. Continue kepra home dose.   Dispo: Disposition is deferred at this time, awaiting improvement of current medical problems. Anticipated discharge is today. The patient is medically stable for discharge to either home or to [facility].   The patient does have a current PCP (GARG, ANKIT, MD), therefore will be requiring OPC follow-up after discharge.  The patient does have transportation limitations that hinder transportation to clinic appointments.  .Services Needed at time of discharge: Y = Yes, Blank = No  PT:    OT:    RN:    Equipment:    Other:        LOS: 2 days   Sara Chu D 08/09/2012, 1:47 PM

## 2012-08-09 NOTE — Progress Notes (Signed)
NURSING PROGRESS NOTE  SHEYLA ZAFFINO [id number] Discharge Data: 08/09/2012 5:48 PM Attending Ankush Gintz: No att. providers found PCP:No primary Rhea Kaelin on file.     [name] to be D/C'd Home per MD order.  Discussed with the patient the After Visit Summary and all questions fully answered. All IV's discontinued with no bleeding noted. All belongings returned to patient for patient to take home.   Last Vital Signs:  Blood pressure 105/70, pulse 109, temperature 98.1 F (36.7 C), temperature source Oral, resp. rate 18, height 5\' 1"  (1.549 m), weight 45.1 kg (99 lb 6.8 oz), SpO2 90.00%.  Discharge Medication List   Medication List    STOP taking these medications       diclofenac 75 MG EC tablet  Commonly known as:  VOLTAREN     diclofenac sodium 1 % Gel  Commonly known as:  VOLTAREN     ibuprofen 200 MG tablet  Commonly known as:  ADVIL,MOTRIN     magnesium oxide 400 MG tablet  Commonly known as:  MAG-OX      TAKE these medications       amLODipine 10 MG tablet  Commonly known as:  NORVASC  Take 10 mg by mouth daily.     amylase-lipase-protease 20-4.5-25 MU per capsule  Commonly known as:  PANGESTYME EC  Take 1 capsule by mouth 3 (three) times daily with meals.     FLUoxetine 10 MG capsule  Commonly known as:  PROZAC  Take 10 mg by mouth daily.     folic acid 1 MG tablet  Commonly known as:  FOLVITE  Take 1 tablet (1 mg total) by mouth daily.     folic acid 1 MG tablet  Commonly known as:  FOLVITE  Take 1 mg by mouth daily.     furosemide 40 MG tablet  Commonly known as:  LASIX  Take 1 tablet (40 mg total) by mouth 2 (two) times daily.     gabapentin 300 MG capsule  Commonly known as:  NEURONTIN  Take 2 capsules (600 mg total) by mouth 3 (three) times daily.     hydrochlorothiazide 25 MG tablet  Commonly known as:  HYDRODIURIL  Take 25 mg by mouth daily.     levETIRAcetam 500 MG tablet  Commonly known as:  KEPPRA  Take 500 mg by mouth 2  (two) times daily.     multivitamin with minerals Tabs  Take 1 tablet by mouth daily.     omeprazole 40 MG capsule  Commonly known as:  PRILOSEC  Take 40 mg by mouth daily.     ranitidine 150 MG tablet  Commonly known as:  ZANTAC  Take 150 mg by mouth 2 (two) times daily.     thiamine 100 MG tablet  Take 1 tablet (100 mg total) by mouth daily.     thiamine 100 MG tablet  Commonly known as:  VITAMIN B-1  Take 100 mg by mouth daily.     vitamin B-12 250 MCG tablet  Commonly known as:  CYANOCOBALAMIN  Take 1 tablet (250 mcg total) by mouth daily.     Vitamin D (Ergocalciferol) 50000 UNITS Caps  Commonly known as:  DRISDOL  Take 50,000 Units by mouth every 7 (seven) days.        Whitaker, Elmarie Mainland, RN

## 2012-08-09 NOTE — Discharge Summary (Signed)
Internal Medicine Teaching [facility] Discharge Note  Name: [name] [name] MRN: [id number] DOB: 08-14-1955 57 y.o.  Date of Admission: 08/07/2012  6:52 PM Date of Discharge: 08/09/2012 Attending Physician: Lars Mage, MD  Discharge Diagnosis: Principal Problem:   Bilateral lower extremity edema Active Problems:   ALCOHOL ABUSE   Seizure disorder   AKI (acute kidney injury)   Hyperkalemia   Fluid overload   Discharge Medications:   Medication List    STOP taking these medications       diclofenac 75 MG EC tablet  Commonly known as:  VOLTAREN     diclofenac sodium 1 % Gel  Commonly known as:  VOLTAREN     ibuprofen 200 MG tablet  Commonly known as:  ADVIL,MOTRIN     magnesium oxide 400 MG tablet  Commonly known as:  MAG-OX      TAKE these medications       amLODipine 10 MG tablet  Commonly known as:  NORVASC  Take 10 mg by mouth daily.     amylase-lipase-protease 20-4.5-25 MU per capsule  Commonly known as:  PANGESTYME EC  Take 1 capsule by mouth 3 (three) times daily with meals.     FLUoxetine 10 MG capsule  Commonly known as:  PROZAC  Take 10 mg by mouth daily.     folic acid 1 MG tablet  Commonly known as:  FOLVITE  Take 1 tablet (1 mg total) by mouth daily.     folic acid 1 MG tablet  Commonly known as:  FOLVITE  Take 1 mg by mouth daily.     furosemide 40 MG tablet  Commonly known as:  LASIX  Take 1 tablet (40 mg total) by mouth 2 (two) times daily.     gabapentin 300 MG capsule  Commonly known as:  NEURONTIN  Take 2 capsules (600 mg total) by mouth 3 (three) times daily.     hydrochlorothiazide 25 MG tablet  Commonly known as:  HYDRODIURIL  Take 25 mg by mouth daily.     levETIRAcetam 500 MG tablet  Commonly known as:  KEPPRA  Take 500 mg by mouth 2 (two) times daily.     multivitamin with minerals Tabs  Take 1 tablet by mouth daily.     omeprazole 40 MG capsule  Commonly known as:  PRILOSEC  Take 40 mg by mouth daily.      ranitidine 150 MG tablet  Commonly known as:  ZANTAC  Take 150 mg by mouth 2 (two) times daily.     thiamine 100 MG tablet  Take 1 tablet (100 mg total) by mouth daily.     thiamine 100 MG tablet  Commonly known as:  VITAMIN B-1  Take 100 mg by mouth daily.     vitamin B-12 250 MCG tablet  Commonly known as:  CYANOCOBALAMIN  Take 1 tablet (250 mcg total) by mouth daily.     Vitamin D (Ergocalciferol) 50000 UNITS Caps  Commonly known as:  DRISDOL  Take 50,000 Units by mouth every 7 (seven) days.        Disposition and follow-up:   [name] was discharged from [facility] in Stable condition.  At the hospital follow up visit please address: -Repeat CMP, CBC  -Follow up on results of UPEP and SPEP -Please decrease dose of Kepra as her level was elevated  -Refer to CSW for options for continued alcohol detoxification -Would consider referral to Gastroenterology for evaluation of likely liver disease  Follow-up Appointments: Follow-up Information   Schedule an appointment as soon as possible for a visit with Lars Mage, MD. (You need to be seen as soon as possible. )    Contact information:   [address] [place] [address] [phone]        Consultations: None  Procedures Performed:  Dg Chest 2 View  08/07/2012  *RADIOLOGY REPORT*  Clinical Data: Swelling in the lower extremities.  CHEST - 2 VIEW  Comparison: 05/20/2012.  Findings: The heart, mediastinum and hilar contours are normal. There has been interval resolution of infiltrates in the lungs as described on the preceding study.  The lungs are now clear.  There is an increase in chronic reticular changes without additional focal abnormalities.  There are no effusions or pneumothoraces. There are no acute bony changes.  IMPRESSION: No active disease   Original Report Authenticated By: Sander Radon, M.D.    US Renal  08/08/2012  *RADIOLOGY REPORT*  Clinical Data: Evaluate for  possible hydronephrosis.  RENAL/URINARY TRACT ULTRASOUND COMPLETE  Comparison:  CT abdomen pelvis 07/13/2011 and ultrasound abdomen 08/16/2007.  Findings:  Right Kidney:  Measures 10.3 cm.  Parenchymal echogenicity is increased.  Trace pelvocaliectasis.  No focal lesions.  Left Kidney:  Measures 11.1 cm.  Parenchymal echogenicity is increased.  Trace pelvocaliectasis.  No focal lesions.  Bladder:  Ureteral jets are identified bilaterally.  IMPRESSION:  1.  Trace bilateral pelvocaliectasis.  No overt hydronephrosis. 2.  Increased renal parenchymal echogenicity is indicative of chronic medical renal disease.   Original Report Authenticated By: Leanna Battles, M.D.     2D Echo:  Study Conclusions  - Left ventricle: The cavity size was normal. Wall thickness was normal. Systolic function was normal. The estimated ejection fraction was in the range of 60% to 65%. Wall motion was normal; there were no regional wall motion abnormalities. - Atrial septum: No defect or patent foramen ovale was identified. Impressions:  - Normal study.  Admission HPI:  [name] is a 57 year old woman with PMH of HTN, chronic alcohol abuse (currently in detox at [facility] in [facility] over the past two weeks), and macrocytic anemia who presented to the [facility] today for evaluation of bilateral feet swelling that has progressively worsened for the past 4 days. She also describes abdominal distention for one day and soft stools for one week. She has increased appetite and states that they are feeding her good foods at [facility]. . She was seen in the ED on 08/04/12 for the leg pain but was discharged back to [facility]. During that visit she was found to be hyperkalemic with K of 5.6, and anemic with Hgb of 8.1, with worsening renal function with Creatinine of 1.5. She reports that since her ED visit she has continued to take potassium supplementation daily. She has difficulty walking due to the bilateral leg pain and feet swelling. She  reports almost a 20lb weight gain in the past two weeks. She denies ever having similar problems in the past. She denies history of blood clots, recent immobilization, or trauma to her legs. Her last alcoholic drink was two weeks ago.  She denies confusion, headache, chest pain, shortness of breath, fever/chills, decreased appetite, increased somnolence, abdominal pain, urinary frequency or retention, urine color changes, diarrhea, or constipation.   Hospital Course by problem list: Anasarca with Bilateral Lower Extremity Edema. Etiology unclear. No signs of infectious etiology with no fever or leukocytosis. Initial differential to include volume overload from CHF or ARF  with nephrotic syndrome. Her UA is negative for proteinuria but she has a protein gap of 6.5, making multiple myeloma a possible cause of nephrotic syndrome and anasarca. Pro-BNP elevated to 1,200 but 2D echo normal. Her O2 saturation was stable at 100% on RA throughout this hospitalization. Her CXR has no sign of PNA or pulmonary edema. She had fine basilar crackles but her O2 saturation remained at 100% on RA. She was given Lasix 40mg  PO BID with increased diuresis and mild improvement of her edema with no facial puffiness noted after initiation of diuretic treatment. She will be discharged with prescription for Lasix 40mg  PO BID with follow up at the [facility] for reevaluation of volume status and repeat BMET for K trending. She has UPEP and SPEP pending upon her discharge.   Ascites. Likely secondary to volume overload although liver disease also a possible explanation. Her alk phos was elevated and she does have history of alcohol abuse, however, alk phos could also be secondary to bone disease, perhaps multiple myeloma.    Hyperkalemia. Resolved. Unclear etiology. EKG with no peaked T waves. K of 5.9 on presentation. She received kayexalate twice and per potassium trended down to 4.5 prior to her discharge. Her Mg level was normal at 2.0.  She will follow up at the [facility] for repeat BMET.   Acute renal failure. Creatinine steadily increased to 1.5 on 3/2, down to 1.39 on presentation, at 1.47 prior to her discharge. Her baseline creatinine is ~0.9. Unclear if preneal, intrarenal, postrenal. No history of renal disease. Protein gap noted on initial CMP, this could also be secondary to multiple myeloma. UA with no proteinuria, however. Renal US with no overt hydronephrosis. UPEP and SPEP ordered and pending upon her discharge. She will follow up at the [facility].   Alcohol abuse. Prior to her presentation she was undergoing inpatient alcohol detoxications with only one week into her 30 day treatment. Unfortunately, due to her current medical issue of anasarca she was deemed medically unsafe for continuation of her detoxification. Per inpatient CSW, [facility] is the only affordable option for this patient and she may need reevaluation by them if she desires to continue alcohol detox. She was placed on CIWA protocol with no signs of withdrawal. She is being discharged to her house and reports that her family has thrown away all alcoholic beverages that were in the house. She reports that she will abstain from alcohol. She will continue taking thiamine and MV daily. She will follow up with the [facility] CSW for more options for facilities providing alcohol detoxification.   Seizure disorder. Kepra level reported after her discharge. Level elevated at 74.6. She is on Keppra 500mg  BID at home, this dose was continued during this hospitalization but needs to be decreased based on increased level and decreased renal function. She will follow up with the [facility]. She was also on gabapentin prior to her admission but this medications was held but resumed upon her discharge.   Discharge Vitals:  BP 128/87  Pulse 112  Temp(Src) 98 F (36.7 C) (Oral)  Resp 19  Ht 5\' 1"  (1.549 m)  Wt 99 lb 6.8 oz (45.1 kg)  BMI 18.8 kg/m2  SpO2 97%  Discharge Labs:  Results for orders  placed during the hospital encounter of 08/07/12 (from the past 24 hour(s))  TSH     Status: None   Collection Time    08/08/12  4:51 PM      Result Value Range   TSH 1.190  0.350 - 4.500 uIU/mL  T3, FREE     Status: Abnormal   Collection Time    08/08/12  4:51 PM      Result Value Range   T3, Free 1.8 (*) 2.3 - 4.2 pg/mL  T4, FREE     Status: Abnormal   Collection Time    08/08/12  4:51 PM      Result Value Range   Free T4 0.71 (*) 0.80 - 1.80 ng/dL  BASIC METABOLIC PANEL     Status: Abnormal   Collection Time    08/08/12  9:49 PM      Result Value Range   Sodium 132 (*) 135 - 145 mEq/L   Potassium 5.8 (*) 3.5 - 5.1 mEq/L   Chloride 105  96 - 112 mEq/L   CO2 16 (*) 19 - 32 mEq/L   Glucose, Bld 150 (*) 70 - 99 mg/dL   BUN 24 (*) 6 - 23 mg/dL   Creatinine, Ser 1.61 (*) 0.50 - 1.10 mg/dL   Calcium 7.8 (*) 8.4 - 10.5 mg/dL   GFR calc non Af Amer 39 (*) >90 mL/min   GFR calc Af Amer 46 (*) >90 mL/min  BASIC METABOLIC PANEL     Status: Abnormal   Collection Time    08/09/12  6:25 AM      Result Value Range   Sodium 137  135 - 145 mEq/L   Potassium 4.5  3.5 - 5.1 mEq/L   Chloride 105  96 - 112 mEq/L   CO2 20  19 - 32 mEq/L   Glucose, Bld 74  70 - 99 mg/dL   BUN 24 (*) 6 - 23 mg/dL   Creatinine, Ser 0.96 (*) 0.50 - 1.10 mg/dL   Calcium 8.0 (*) 8.4 - 10.5 mg/dL   GFR calc non Af Amer 39 (*) >90 mL/min   GFR calc Af Amer 45 (*) >90 mL/min  CBC     Status: Abnormal   Collection Time    08/09/12  9:32 AM      Result Value Range   WBC 7.6  4.0 - 10.5 K/uL   RBC 2.49 (*) 3.87 - 5.11 MIL/uL   Hemoglobin 8.4 (*) 12.0 - 15.0 g/dL   HCT 04.5 (*) 40.9 - 81.1 %   MCV 98.4  78.0 - 100.0 fL   MCH 33.7  26.0 - 34.0 pg   MCHC 34.3  30.0 - 36.0 g/dL   RDW 91.4  78.2 - 95.6 %   Platelets 250  150 - 400 K/uL  RETICULOCYTES     Status: Abnormal   Collection Time    08/09/12 10:40 AM      Result Value Range   Retic Ct Pct 3.0  0.4 - 3.1 %   RBC. 2.49 (*) 3.87 - 5.11 MIL/uL   Retic  Count, Manual 74.7  19.0 - 186.0 K/uL    Signed: Sara Chu D 08/09/2012, 1:45 PM   Time Spent on Discharge: 40 minutes Services Ordered on Discharge: None Equipment Ordered on Discharge: None

## 2012-08-10 ENCOUNTER — Other Ambulatory Visit: Payer: Self-pay | Admitting: Internal Medicine

## 2012-08-11 LAB — IRON AND TIBC
Iron: 59 ug/dL (ref 42–135)
Saturation Ratios: 29 % (ref 20–55)
TIBC: 203 ug/dL — ABNORMAL LOW (ref 250–470)
UIBC: 144 ug/dL (ref 125–400)

## 2012-08-11 LAB — LEVETIRACETAM LEVEL: Levetiracetam Lvl: 74.6 ug/mL — ABNORMAL HIGH (ref 5.0–30.0)

## 2012-08-12 LAB — FERRITIN: Ferritin: 581 ng/mL — ABNORMAL HIGH (ref 10–291)

## 2012-08-13 LAB — PROTEIN ELECTROPHORESIS, SERUM
Albumin ELP: 30 % — ABNORMAL LOW (ref 55.8–66.1)
Alpha-1-Globulin: 6.4 % — ABNORMAL HIGH (ref 2.9–4.9)
Alpha-2-Globulin: 12.1 % — ABNORMAL HIGH (ref 7.1–11.8)
Beta Globulin: 6.7 % (ref 4.7–7.2)
M-Spike, %: NOT DETECTED g/dL
Total Protein ELP: 7.7 g/dL (ref 6.0–8.3)

## 2012-08-13 LAB — UIFE/LIGHT CHAINS/TP QN, 24-HR UR
Free Kappa Lt Chains,Ur: 2.92 mg/dL — ABNORMAL HIGH (ref 0.14–2.42)
Free Kappa/Lambda Ratio: 5.73 ratio (ref 2.04–10.37)
Free Lambda Lt Chains,Ur: 0.51 mg/dL (ref 0.02–0.67)
Total Protein, Urine: 4.3 mg/dL

## 2012-08-14 ENCOUNTER — Telehealth: Payer: Self-pay | Admitting: Internal Medicine

## 2012-08-14 ENCOUNTER — Encounter: Payer: Self-pay | Admitting: Internal Medicine

## 2012-08-14 ENCOUNTER — Ambulatory Visit (INDEPENDENT_AMBULATORY_CARE_PROVIDER_SITE_OTHER): Payer: PRIVATE HEALTH INSURANCE | Admitting: Internal Medicine

## 2012-08-14 VITALS — BP 107/79 | HR 100 | Temp 98.3°F | Ht 61.0 in | Wt 83.7 lb

## 2012-08-14 DIAGNOSIS — I10 Essential (primary) hypertension: Secondary | ICD-10-CM

## 2012-08-14 DIAGNOSIS — D890 Polyclonal hypergammaglobulinemia: Secondary | ICD-10-CM

## 2012-08-14 DIAGNOSIS — R609 Edema, unspecified: Secondary | ICD-10-CM

## 2012-08-14 DIAGNOSIS — R600 Localized edema: Secondary | ICD-10-CM

## 2012-08-14 DIAGNOSIS — I872 Venous insufficiency (chronic) (peripheral): Secondary | ICD-10-CM

## 2012-08-14 DIAGNOSIS — E43 Unspecified severe protein-calorie malnutrition: Secondary | ICD-10-CM | POA: Insufficient documentation

## 2012-08-14 DIAGNOSIS — E8809 Other disorders of plasma-protein metabolism, not elsewhere classified: Secondary | ICD-10-CM

## 2012-08-14 DIAGNOSIS — E8770 Fluid overload, unspecified: Secondary | ICD-10-CM

## 2012-08-14 DIAGNOSIS — G40909 Epilepsy, unspecified, not intractable, without status epilepticus: Secondary | ICD-10-CM

## 2012-08-14 DIAGNOSIS — E8779 Other fluid overload: Secondary | ICD-10-CM

## 2012-08-14 DIAGNOSIS — K769 Liver disease, unspecified: Secondary | ICD-10-CM

## 2012-08-14 DIAGNOSIS — E46 Unspecified protein-calorie malnutrition: Secondary | ICD-10-CM

## 2012-08-14 DIAGNOSIS — F1010 Alcohol abuse, uncomplicated: Secondary | ICD-10-CM

## 2012-08-14 DIAGNOSIS — E875 Hyperkalemia: Secondary | ICD-10-CM

## 2012-08-14 LAB — COMPREHENSIVE METABOLIC PANEL WITH GFR
ALT: 15 U/L (ref 0–35)
AST: 49 U/L — ABNORMAL HIGH (ref 0–37)
Albumin: 3.1 g/dL — ABNORMAL LOW (ref 3.5–5.2)
Alkaline Phosphatase: 210 U/L — ABNORMAL HIGH (ref 39–117)
BUN: 33 mg/dL — ABNORMAL HIGH (ref 6–23)
CO2: 22 meq/L (ref 19–32)
Calcium: 8.6 mg/dL (ref 8.4–10.5)
Chloride: 98 meq/L (ref 96–112)
Creat: 1.32 mg/dL — ABNORMAL HIGH (ref 0.50–1.10)
Glucose, Bld: 100 mg/dL — ABNORMAL HIGH (ref 70–99)
Potassium: 4.3 meq/L (ref 3.5–5.3)
Sodium: 136 meq/L (ref 135–145)
Total Bilirubin: 0.3 mg/dL (ref 0.3–1.2)
Total Protein: 9.5 g/dL — ABNORMAL HIGH (ref 6.0–8.3)

## 2012-08-14 MED ORDER — FUROSEMIDE 40 MG PO TABS: 40.0000 mg | ORAL_TABLET | Freq: Every day | ORAL | Status: DC

## 2012-08-14 NOTE — Progress Notes (Signed)
Pt was given address/telephone # to [facility] on [address] to purchase compression stockings. Price $14.00 and up; insurance usually will not pay - pt informed.

## 2012-08-14 NOTE — Assessment & Plan Note (Signed)
Hx of chronic alcohol abuse.  Often due to infectious, inflammatory, or reactive process.  Hepatitis panel negative in 2012.  HIV negative 05/2012.    -continued alcohol cessation, is no longer at [facility] -encouraged to call if she feels like she will start drinking again at home -improve nutrition

## 2012-08-14 NOTE — Assessment & Plan Note (Signed)
Much improved today and back to baseline.  Complaining of itchiness since swelling improved and soreness in b/l lower extremities.  Claims legs still get swollen when she stands for some time.  ?chronic venous insufficiency.  Hypoalbuminemia is likely cause of edema and will need to improve nutrition.    -continue lasix but decrease dose to 40mg  daily and eventually titrate down as this is likely not fix for the edema which is coming from low albumin.   -compression stockings--to be fit for and in AM -f/u cmet in setting of lasix start -has ensure at home and instructed to resume ensure and if affordable will need to get more

## 2012-08-14 NOTE — Assessment & Plan Note (Signed)
Improved fluid status today.  Noted to have hypoalbuminemia--albumin 2.1--likely source of swelling.  Echo: normal study with ef 60-65%  -compression stockings for flower extremities -resume ensure -continue lasix 40mg  daily and need to titrate off and monitor renal function carefully -f/u cmet

## 2012-08-14 NOTE — Assessment & Plan Note (Signed)
BP Readings from Last 3 Encounters:  08/14/12 107/79  08/09/12 105/70  08/07/12 116/77   Lab Results  Component Value Date   NA 137 08/09/2012   K 4.5 08/09/2012   CREATININE 1.47* 08/09/2012    Assessment:  Blood pressure control: controlled  Progress toward BP goal:  at goal  Comments: started on lasix on hospital discharge for edema, 40mg  bid, will decrease to 40mg  qd  Plan:  Medications:  continue current medications: lasix 40mg  qd (will eventually need to come off this), norvasc 10mg  qd, and hctz 25mg  qd  Educational resources provided: brochure  Self management tools provided:

## 2012-08-14 NOTE — Assessment & Plan Note (Addendum)
Possibly multifactorial--malnutrition and liver disease 2/2 chronic alcoholism.  Hepatitis panel negative.  Elevated ALP 300s 08/07/12 Ensure supplements Likely cause of anasarca and edema that led to admission May benefit from nutrition consult if affordable

## 2012-08-14 NOTE — Telephone Encounter (Signed)
Error,  No call made

## 2012-08-14 NOTE — Assessment & Plan Note (Signed)
Resolved after hospital admission.  4.5 on discharge.    -f/u cmet today

## 2012-08-14 NOTE — Progress Notes (Signed)
Subjective:   [name] female   DOB: 1955/06/20 57 y.o.   MRN: [id number]  HPI: [name] [name] is a 57 y.o. African American female with PMH of HTN (well controlled), chronic alcohol abuse (has been in rehab facility but home since hospital discharge), and macrocytic anemia presenting to clinic today for hospital follow up after admission on 08/07/12 for fluid overload.  She was discharged from [facility] 08/09/12 back to her home on lasix 40mg  bid.  Since then swelling has drastically improved and her weight is back down to 83lbs today.  She has continued to not drink alcohol and plans to continue cessation.  I have encouraged her to continue cessation.  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  echo was normal study.    In regards to her lower extremity edema: back to baseline at this time but she claims they swell up when she stands for too long.    She denies any chest pain, or shortness of breath, fever, chills, N/V/D, abdominal pain, or any urinary complaints at this time.   Past Medical History  Diagnosis Date  . Anemia, B12 deficiency   . History of acute pancreatitis   . Right knee pain     No recent imaging on chart  . 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 Dr. Su Hilt, [facility]  . Hypertriglyceridemia   . GERD (gastroesophageal reflux disease)   . Vitamin D deficiency   . Subdural hematoma 02/2008    Likely 2/2 trauma from seizure from EtOH withdrawal, chronic in nature, sees Dr. Robyne Askew. Most recent CT head 10/2009 showing stable but persistent hematoma without mass effect.  . History of seizure disorder     Likely 2/2 alcohol abuse  . Hypocalcemia   . Hypomagnesemia   . Failure to thrive in childhood     Unclear etiology  . HTN (hypertension)   . Thrombocytopenia   . Anemia, macrocytic   . Hepatomegaly     On exam  . Alcohol  abuse   . Joint pain   . Alcohol abuse   . Arthritis   . Vitamin D deficiency   . Menopause   . Pancreatitis   . Insomnia   . Hyperlipidemia   . Sinusitis   . Pernicious anemia   . Subdural hematoma   . Macrocytic anemia   . Hepatomegaly   . 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 Seizures     1.5 YRS  LAST ONE  . Depression   . Hepatitis     HEPATOMEGALY   . Fx humeral neck 04/17/2011    Transverse fracture- minimally displaced- managed as outpatient   . ABNORMAL PAP SMEAR, LGSIL 07/23/2008    Annotation: HPV positive CIN I Dr. Su Hilt, [facility] Qualifier: Diagnosis of  By: Danae Chen    . Pneumonia 05/20/2012   Current Outpatient Prescriptions  Medication Sig Dispense Refill  . amLODipine (NORVASC) 10 MG tablet Take 10 mg by mouth daily.      Marland Kitchen amylase-lipase-protease (PANGESTYME EC) 20-4.5-25 MU per capsule Take 1 capsule by mouth 3 (three) times daily with meals.       Marland Kitchen FLUoxetine (PROZAC) 10 MG capsule Take 10 mg by mouth daily.       . folic acid (FOLVITE) 1 MG tablet Take 1 tablet (1 mg total) by mouth  daily.  30 tablet  1  . furosemide (LASIX) 40 MG tablet Take 1 tablet (40 mg total) by mouth daily.  30 tablet  0  . gabapentin (NEURONTIN) 300 MG capsule Take 2 capsules (600 mg total) by mouth 3 (three) times daily.  90 capsule  2  . hydrochlorothiazide (HYDRODIURIL) 25 MG tablet Take 25 mg by mouth daily.      Marland Kitchen levETIRAcetam (KEPPRA) 500 MG tablet Take 500 mg by mouth 2 (two) times daily.      . Multiple Vitamin (MULITIVITAMIN WITH MINERALS) TABS Take 1 tablet by mouth daily.  30 tablet  11  . vitamin B-12 (CYANOCOBALAMIN) 250 MCG tablet Take 1 tablet (250 mcg total) by mouth daily.      . Vitamin D, Ergocalciferol, (DRISDOL) 50000 UNITS CAPS Take 50,000 Units by mouth every 7 (seven) days.      . VOLTAREN 1 % GEL APPLY TO AFFECTED AREA 4 TIMES A DAY  100 g  2  . KLOR-CON M20 20 MEQ tablet       . omeprazole (PRILOSEC) 40 MG capsule Take 40  mg by mouth daily.      . ranitidine (ZANTAC) 150 MG tablet Take 150 mg by mouth 2 (two) times daily.      Marland Kitchen thiamine (VITAMIN B-1) 100 MG tablet Take 100 mg by mouth daily.       No current facility-administered medications for this visit.   Family History  Problem Relation Age of Onset  . Cancer Mother     Died from stomach cancer and "flesh eating rash  . Heart failure Father     Died in 26s from an MI  . Alcohol abuse Sister     Twin sister drinks a lot, as did both her parents and brothers  . Stroke Brother     Has 7 brothers, 1 with CVA   History   Social History  . Marital Status: Divorced    Spouse Name: N/A    Number of Children: N/A  . Years of Education: N/A   Social History Main Topics  . Smoking status: Former Smoker    Types: Cigarettes    Quit date: 09/20/2010  . Smokeless tobacco: Never Used  . Alcohol Use: No     Comment: No alocohol x 3 weeks  . Drug Use: No     Comment: HX  USE   . Sexually Active: None   Other Topics Concern  . None   Social History Narrative   Lives with her significant other and 2 grandchildren. 1 child   Has 7 brothers and 4 sisters, 1 twin sister.   Unemployed, worked in [employer].    Abuses alcohol-drinks 1 glass of wine daily    No drug use. Former cigarette use quit 1.5 years ago.     11 th grade education            Review of Systems: Constitutional: Denies fever, chills, diaphoresis, appetite change and fatigue. itching  HEENT: Denies photophobia, eye pain, redness, hearing loss, ear pain, congestion, sore throat, rhinorrhea, sneezing, mouth sores, trouble swallowing, neck pain, neck stiffness and tinnitus.   Respiratory: Denies SOB, DOE, cough, chest tightness,  and wheezing.   Cardiovascular: Denies chest pain, palpitations and leg swelling.  Gastrointestinal: Denies nausea, vomiting, abdominal pain, diarrhea, constipation, blood in stool and abdominal distention.  Genitourinary: Denies dysuria, urgency,  frequency, hematuria, flank pain and difficulty urinating.  Musculoskeletal: lower extremity soreness on palpation.  Denies back  pain, joint swelling, arthralgias and gait problem.  Skin: Denies pallor, rash and wound.  Neurological: Denies dizziness, seizures, syncope, weakness, light-headedness, numbness and headaches.  Hematological: Denies adenopathy. Easy bruising, personal or family bleeding history  Psychiatric/Behavioral: Denies suicidal ideation, mood changes, confusion, nervousness, sleep disturbance and agitation  Objective:  Physical Exam: Filed Vitals:   08/14/12 0955  BP: 107/79  Pulse: 100  Temp: 98.3 F (36.8 C)  TempSrc: Oral  Height: 5\' 1"  (1.549 m)  Weight: 83 lb 11.2 oz (37.966 kg)  SpO2: 98%   Constitutional: Vital signs reviewed.  Patient is a very thin female in no acute distress and cooperative with exam. Alert and oriented x3.  Head: Normocephalic and atraumatic Ear: TM normal bilaterally Mouth: no erythema or exudates, MMM Eyes: PERRL, EOMI, conjunctivae normal, No scleral icterus.  Neck: Supple, Trachea midline normal ROM Cardiovascular: tachycardia, S1 normal, S2 normal, no MRG, pulses symmetric and intact bilaterally Pulmonary/Chest: CTAB, no wheezes, rales, or rhonchi Abdominal: Soft. Non-tender, non-distended, bowel sounds are normal, no masses, organomegaly, or guarding present.  GU: no CVA tenderness Musculoskeletal: No joint deformities, erythema, or stiffness, ROM full, no edema. +2dp b/l, tenderness to palpation of b/l lower extremities.   Hematology: no cervical, inginal, or axillary adenopathy.  Neurological: A&O x3, Strength is normal and symmetric bilaterally, cranial nerve II-XII are grossly intact, no focal motor deficit, sensory intact to light touch bilaterally.  Skin: Warm, dry and intact. No rash, cyanosis, or clubbing. Right arm excoriations from scratching.    Psychiatric: Normal mood and affect. speech and behavior is normal. Judgment  and thought content normal. Cognition and memory are normal.   Assessment & Plan:  Discussed with Dr. Harriet Pho  -decreased lasix to 40mg  qd -compression stocking -f/u cmet, monitor renal function and may need to adjust keppra dose if renal function dose not improve

## 2012-08-14 NOTE — Assessment & Plan Note (Signed)
Chronic alcoholic, currently clean for several months.    -continue ensure that she has at home, will need to continue ensure supplementation and nutrition -avoid alcohol -may benefit from nutrition consult if affordable

## 2012-08-14 NOTE — Assessment & Plan Note (Signed)
On keppra 500mg  po bid.    levetiracetem level elevated at 74.6 on 08/07/12 with GFR 45 on 08/09/12.    -Continue current keppra dose Will check cmet today and monitor renal function.  Expect renal function to improve with decrease in lasix frequency from 40mg  bid to 40mg  qd -f/u cmet, if renal function continues to worsen and no improvement with change in lasix dose, may need to renally dose keppra and change current prescription.   Per up to date: renal dosing: CrCl >80 mL/minute/1.73 m2: [phone] mg every 12 hours  CrCl 50-80 mL/minute/1.73 m2: [phone] mg every 12 hours  CrCl 30-50 mL/minute/1.73 m2: 250-750 mg every 12 hours; pt's current dose is 500mg  bid thus still appropriate for current renal function  CrCl <30 mL/minute/1.73 m2: 250-500 mg every 12 hours

## 2012-08-14 NOTE — Patient Instructions (Signed)
Please decrease your lasix dose to 40mg  ONCE daily now  Please go for your compression stalking fitting and apply them as instructed--this should help with swelling  Please DO NOT drink alcohol, you have been doing so well, KEEP IT UP!  Can you afford ensure? If so, let us know and you can start drinking that to help with your nutrition  Please continue to try to eat and drink regularly

## 2012-08-14 NOTE — Assessment & Plan Note (Signed)
No longer at [facility] since hospital discharge.  Now back to her house.  Counseled extensively on not starting to drink alcohol again!  -continue to monitor for alcohol abuse since back at home -encouraged to continue cessation

## 2012-09-02 NOTE — Progress Notes (Signed)
I discussed the patient case with Dr. Virgina Organ.

## 2012-09-10 ENCOUNTER — Encounter: Payer: Self-pay | Admitting: Internal Medicine

## 2012-09-10 DIAGNOSIS — R269 Unspecified abnormalities of gait and mobility: Secondary | ICD-10-CM | POA: Insufficient documentation

## 2012-09-23 ENCOUNTER — Encounter: Payer: Self-pay | Admitting: *Deleted

## 2012-09-26 ENCOUNTER — Telehealth: Payer: Self-pay | Admitting: *Deleted

## 2012-09-26 NOTE — Telephone Encounter (Signed)
Pt called and stated she will be changing pharmacy - to [facility]. No refills needed at this time per pt.

## 2012-10-08 ENCOUNTER — Encounter: Payer: Self-pay | Admitting: Internal Medicine

## 2012-10-08 ENCOUNTER — Ambulatory Visit (INDEPENDENT_AMBULATORY_CARE_PROVIDER_SITE_OTHER): Payer: PRIVATE HEALTH INSURANCE | Admitting: Internal Medicine

## 2012-10-08 VITALS — BP 136/92 | HR 85 | Temp 96.8°F | Ht 62.5 in | Wt 87.4 lb

## 2012-10-08 DIAGNOSIS — G40909 Epilepsy, unspecified, not intractable, without status epilepticus: Secondary | ICD-10-CM

## 2012-10-08 DIAGNOSIS — D890 Polyclonal hypergammaglobulinemia: Secondary | ICD-10-CM

## 2012-10-08 DIAGNOSIS — F329 Major depressive disorder, single episode, unspecified: Secondary | ICD-10-CM

## 2012-10-08 DIAGNOSIS — R609 Edema, unspecified: Secondary | ICD-10-CM

## 2012-10-08 DIAGNOSIS — E46 Unspecified protein-calorie malnutrition: Secondary | ICD-10-CM

## 2012-10-08 DIAGNOSIS — I10 Essential (primary) hypertension: Secondary | ICD-10-CM

## 2012-10-08 DIAGNOSIS — F1010 Alcohol abuse, uncomplicated: Secondary | ICD-10-CM

## 2012-10-08 DIAGNOSIS — E079 Disorder of thyroid, unspecified: Secondary | ICD-10-CM

## 2012-10-08 DIAGNOSIS — K769 Liver disease, unspecified: Secondary | ICD-10-CM

## 2012-10-08 DIAGNOSIS — E8809 Other disorders of plasma-protein metabolism, not elsewhere classified: Secondary | ICD-10-CM

## 2012-10-08 DIAGNOSIS — E559 Vitamin D deficiency, unspecified: Secondary | ICD-10-CM

## 2012-10-08 DIAGNOSIS — F3289 Other specified depressive episodes: Secondary | ICD-10-CM

## 2012-10-08 DIAGNOSIS — R600 Localized edema: Secondary | ICD-10-CM

## 2012-10-08 LAB — COMPLETE METABOLIC PANEL WITH GFR
ALT: 12 U/L (ref 0–35)
Albumin: 2.9 g/dL — ABNORMAL LOW (ref 3.5–5.2)
Alkaline Phosphatase: 299 U/L — ABNORMAL HIGH (ref 39–117)
CO2: 16 mEq/L — ABNORMAL LOW (ref 19–32)
GFR, Est Non African American: 55 mL/min — ABNORMAL LOW
Glucose, Bld: 99 mg/dL (ref 70–99)
Potassium: 3.9 mEq/L (ref 3.5–5.3)
Sodium: 139 mEq/L (ref 135–145)
Total Protein: 9.1 g/dL — ABNORMAL HIGH (ref 6.0–8.3)

## 2012-10-08 MED ORDER — AMLODIPINE BESYLATE 10 MG PO TABS: 10.0000 mg | ORAL_TABLET | Freq: Every day | ORAL | Status: DC

## 2012-10-08 MED ORDER — FUROSEMIDE 20 MG PO TABS: 20.0000 mg | ORAL_TABLET | Freq: Every day | ORAL | Status: DC

## 2012-10-08 MED ORDER — CYANOCOBALAMIN 250 MCG PO TABS: 250.0000 ug | ORAL_TABLET | Freq: Every day | ORAL | Status: DC

## 2012-10-08 MED ORDER — FOLIC ACID 1 MG PO TABS: 1.0000 mg | ORAL_TABLET | Freq: Every day | ORAL | Status: DC

## 2012-10-08 MED ORDER — CETIRIZINE HCL 10 MG PO TABS: 10.0000 mg | ORAL_TABLET | Freq: Every day | ORAL | Status: DC

## 2012-10-08 MED ORDER — OMEPRAZOLE 40 MG PO CPDR: 40.0000 mg | DELAYED_RELEASE_CAPSULE | Freq: Every day | ORAL | Status: DC

## 2012-10-08 MED ORDER — HYDROCHLOROTHIAZIDE 25 MG PO TABS: 25.0000 mg | ORAL_TABLET | Freq: Every day | ORAL | Status: DC

## 2012-10-08 MED ORDER — FLUOXETINE HCL 10 MG PO CAPS: 10.0000 mg | ORAL_CAPSULE | Freq: Every day | ORAL | Status: DC

## 2012-10-08 MED ORDER — VITAMIN B-1 100 MG PO TABS: 100.0000 mg | ORAL_TABLET | Freq: Every day | ORAL | Status: DC

## 2012-10-08 MED ORDER — DICLOFENAC SODIUM 1 % TD GEL: TRANSDERMAL | Status: DC

## 2012-10-08 NOTE — Patient Instructions (Signed)
Dear [name] [name],  We met today and discussed about your leg swelling and nosebleeds. Please read and please comply with the following things we decided to do this visit for you.   I am so glad that you have not had a drink since your rehabilitation. Please keep doing that.  Nosebleeds - could be due to your high blood pressure or due to allergies. Please take the allergy medicine (CETRIZINE) prescribed, and use saline drops to keep your nose moist.  REFILLS - I have refilled all the medicines that you asked for.   LEG SWELLING - It is better. I have decreased the dose of your Water Pill LASIX. Please take this dose 20 mg daily for 30 days and then come back to see me.  We have done blood work this visit. Whenever we do blood work, we will call you with abnormal results.   Please keep taking ENSURE  Please schedule next visit in 1 month.  Thanks, and take care,  Dr. Aletta Edouard MD MPH Clinical Assistant Professor [facility]  10/08/2012 9:19 AM

## 2012-10-08 NOTE — Progress Notes (Signed)
Subjective:     [name], female   DOB: Oct 02, 1955, 57 y.o.   MRN: [id number]  HPI [name] [name] was admitted in the hospital with anasarca, including bilateral leg swelling in March 2014. After that she spent a few weeks at [facility], rehabilitating from alcohol. She has been back home now for the past few weeks.   1. Leg swelling - He leg swelling has gone down, and now she has mild swelling only on the dorsum of her right ankle. She says that the swelling goes down on feet elevation. She does not have pain in her legs. She is compliant with lasix 40 mg prescribed for the swelling.    2. Alcoholism - She maintains that she has been clean since the time she returned from [facility]. She does not drive any more.  3. Malnutrition - She has been taking Ensure as advised and trying to eat more nutritious food while staying away from alcohol.  4. Seizures - She reports no seizures and reports being "mostly compliant" with Keppra.  5. Depression - She says that she gets low sometimes because of her "life issues". She has been on prozac but stopped taking it sometime back. She denies SI. She wants to see a psychiatrist.   6. Nosebleeds - The patient complains of some nosebleeds when she blows her nose in teh morning. It has been happening for the past 3-4 days.   Denies chest pain, palpitations, recent falls or shortness of breath.   Review of Systems  Constitutional: Negative.   HENT: Negative.   Eyes: Negative.   Respiratory: Negative.   Cardiovascular: Negative for chest pain, palpitations and leg swelling.  Musculoskeletal: Negative.   Neurological: Negative.  Negative for dizziness, seizures, syncope, speech difficulty, weakness, light-headedness and numbness.  Psychiatric/Behavioral: Negative for behavioral problems and agitation.       Objective:   Physical Exam  Constitutional: She is oriented to person, place, and time. She appears  well-developed and well-nourished.  HENT:  Nose: Mucosal edema present.  Small bleeding points with no bleeding on medial septa, bilaterally.  Neck: Normal range of motion. Neck supple.  Cardiovascular: Normal rate, regular rhythm, normal heart sounds and intact distal pulses.   Pulmonary/Chest: Effort normal and breath sounds normal.  Abdominal: Soft.  Musculoskeletal: Normal range of motion.  Neurological: She is alert and oriented to person, place, and time.  Skin: Skin is warm.  Psychiatric: She has a normal mood and affect. Her behavior is normal.       Assessment & Plan:     1. Leg swelling - Much improved. I have decreased her lasix to 20 mg once daily. Leg elevation, compression stockings, advised. We will check a BMP today to monitor electrolytes while on lasix and HCTZ both. The patient apparently has gabapentin as one of the medications in her medication list, however, she is taking it very sparsely. The patient is also on Amlodipine which could contribute to LE edema, however, she refuses to change it at this time.   2. Epistaxis, mild -  Could be in the setting of hypertension, or due to seasonal allergies. Zyrtec prescribed in limited quantity. Saline drops to keep nose moist.   3. Alcoholism - patient keeping clean, per patient report. On folic acid, MV and Thiamine.   4. Malnutrition - Trying to eat better and using ensure.   5. Hypertension -   BP Readings from Last 3 Encounters:  10/08/12 136/92  08/14/12 107/79  08/09/12 105/70   Controlled on Amlodipine, lasix and hydrochlorthiazide. Patient does not want to change amlodipine, especially in the setting of improving lower extremity edema.  Repeat bloodwork done today.   6. Depression - The patient would like to see a psychiatrist for her depression - referral made. She denies SI. Prozac restarted.    7. Hypoalbuminemia - Likely multifactorial, due to may be liver involvement and malnutrition due to alcohol  abuse, less likley due to kidney, as protein/creatinine ration is 0.11.   8. Blood work done today - CMP, TSH, Mg, Urine protein, urine creatinine. Will call patient with results and make required changes.   NEXT VISIT - Hypertension revisit, amlodipine counseling if leg edema still persists, perhaps take lasix off the table and add spironolactone.

## 2012-10-09 ENCOUNTER — Encounter: Payer: Self-pay | Admitting: Licensed Clinical Social Worker

## 2012-10-09 ENCOUNTER — Telehealth: Payer: Self-pay | Admitting: Licensed Clinical Social Worker

## 2012-10-09 MED ORDER — MAGNESIUM OXIDE 400 MG PO TABS: 400.0000 mg | ORAL_TABLET | Freq: Two times a day (BID) | ORAL | Status: DC

## 2012-10-09 MED ORDER — VITAMIN D (ERGOCALCIFEROL) 1.25 MG (50000 UNIT) PO CAPS: 50000.0000 [IU] | ORAL_CAPSULE | ORAL | Status: DC

## 2012-10-09 NOTE — Addendum Note (Signed)
Addended byAletta Edouard on: 10/09/2012 02:16 PM   Modules accepted: Orders

## 2012-10-09 NOTE — Telephone Encounter (Signed)
[name] was referred to CSW for psychiatry referral.  CSW placed call to [name] [name].  Pt confirmed she would like someone that can prescribed medication.  Pt currently has prescription for Prozac.  Pt voiced concern during her scheduled [facility] appt with feeling low at times due to life circumstances.  CSW informed [name] [name] of several agencies available through her insurance, pt has no preferences and requesting CSW to schedule an appt.  CSW offered an agency that is within pt's zip code for easy access.  Pt in agreement.  CSW placed call to [facility], as they are located within [id number].  [facility] has med management available now for [name] [name] and will be able to over outpatient substance abuse therapy in July 2014, if pt would like therapy services.  CSW scheduled appt for 5/17 at 1300.  Pt notified and informed of the above by telephone.  CSW mailed letter and ROI.

## 2012-10-09 NOTE — Progress Notes (Addendum)
[name], female   DOB: [birth year], 56 y.o.   MRN: [id number]  Called the patient to discuss lab work. The patient has very low magnesium (which she has had several times in the past). She told me that she has stopped taking her magnesium tablets, and I asked her to start taking them immediately. She agrees and will resume taking Mag Ox 400 mg two times a day. I will reorder her prescriptions, though she did confirm that she has leftover magnesium pills at home. She also has low vitamin D and she is already on a supplement, which she had not been taking regularly either. She will take it faithfully from now onwards. She needs to come in for a Magnesium level check in 1 - 2 weeks. We will also follow up on Alkphos at that time.

## 2012-10-11 NOTE — Progress Notes (Signed)
Flag sent to front desk pool to sch appt for only labs 1-2 weeks per Dr Dalphine Handing.

## 2012-10-14 ENCOUNTER — Telehealth: Payer: Self-pay | Admitting: *Deleted

## 2012-10-14 NOTE — Telephone Encounter (Signed)
Pt called and wanted [facility] to call [facility] about med not getting from pharmacy. Talked with Maggie at [facility]  and states only 2 meds will not be sent to pt because they were OTC - Delsym and calcium carbonate from 05/2012 Dr Shirlee Latch. Talked with pt about above. Both have been d/c. Stanton Kidney Sorayah Schrodt RN 10/14/12 3:15PM

## 2012-10-23 ENCOUNTER — Other Ambulatory Visit: Payer: Self-pay | Admitting: *Deleted

## 2012-10-27 MED ORDER — AMYLASE-LIPASE-PROTEASE 20-4.5-25 MU PO CPEP: 1.0000 | ORAL_CAPSULE | Freq: Three times a day (TID) | ORAL | Status: DC

## 2012-10-29 ENCOUNTER — Telehealth: Payer: Self-pay | Admitting: *Deleted

## 2012-10-29 ENCOUNTER — Other Ambulatory Visit (INDEPENDENT_AMBULATORY_CARE_PROVIDER_SITE_OTHER): Payer: PRIVATE HEALTH INSURANCE

## 2012-10-29 DIAGNOSIS — F1010 Alcohol abuse, uncomplicated: Secondary | ICD-10-CM

## 2012-10-29 DIAGNOSIS — E46 Unspecified protein-calorie malnutrition: Secondary | ICD-10-CM

## 2012-10-29 DIAGNOSIS — E8809 Other disorders of plasma-protein metabolism, not elsewhere classified: Secondary | ICD-10-CM

## 2012-10-29 DIAGNOSIS — E079 Disorder of thyroid, unspecified: Secondary | ICD-10-CM

## 2012-10-29 LAB — COMPLETE METABOLIC PANEL WITH GFR
AST: 48 U/L — ABNORMAL HIGH (ref 0–37)
BUN: 16 mg/dL (ref 6–23)
Calcium: 6 mg/dL — CL (ref 8.4–10.5)
Chloride: 103 mEq/L (ref 96–112)
Creat: 1.29 mg/dL — ABNORMAL HIGH (ref 0.50–1.10)
GFR, Est African American: 54 mL/min — ABNORMAL LOW
Total Bilirubin: 0.2 mg/dL — ABNORMAL LOW (ref 0.3–1.2)

## 2012-10-29 NOTE — Telephone Encounter (Signed)
Pharmacy calls and states pagestyme is no longer made, would you like to switch to creon? If so please send electronically

## 2012-10-30 ENCOUNTER — Encounter: Payer: Self-pay | Admitting: Internal Medicine

## 2012-10-30 ENCOUNTER — Ambulatory Visit (INDEPENDENT_AMBULATORY_CARE_PROVIDER_SITE_OTHER): Payer: PRIVATE HEALTH INSURANCE | Admitting: Internal Medicine

## 2012-10-30 ENCOUNTER — Ambulatory Visit (HOSPITAL_COMMUNITY)
Admission: RE | Admit: 2012-10-30 | Discharge: 2012-10-30 | Disposition: A | Discharge status: Home / Self Care | Payer: PRIVATE HEALTH INSURANCE | Source: Ambulatory Visit | Attending: Internal Medicine | Admitting: Internal Medicine

## 2012-10-30 ENCOUNTER — Telehealth: Payer: Self-pay | Admitting: Internal Medicine

## 2012-10-30 DIAGNOSIS — R9431 Abnormal electrocardiogram [ECG] [EKG]: Secondary | ICD-10-CM | POA: Insufficient documentation

## 2012-10-30 DIAGNOSIS — E559 Vitamin D deficiency, unspecified: Secondary | ICD-10-CM

## 2012-10-30 DIAGNOSIS — E079 Disorder of thyroid, unspecified: Secondary | ICD-10-CM

## 2012-10-30 DIAGNOSIS — E43 Unspecified severe protein-calorie malnutrition: Secondary | ICD-10-CM

## 2012-10-30 DIAGNOSIS — I498 Other specified cardiac arrhythmias: Secondary | ICD-10-CM | POA: Insufficient documentation

## 2012-10-30 LAB — BASIC METABOLIC PANEL WITH GFR
Chloride: 106 mEq/L (ref 96–112)
GFR, Est African American: 59 mL/min — ABNORMAL LOW
GFR, Est Non African American: 51 mL/min — ABNORMAL LOW
Potassium: 4.2 mEq/L (ref 3.5–5.3)
Sodium: 136 mEq/L (ref 135–145)

## 2012-10-30 MED ORDER — MAGNESIUM OXIDE 400 MG PO TABS: 400.0000 mg | ORAL_TABLET | Freq: Three times a day (TID) | ORAL | Status: DC

## 2012-10-30 MED ORDER — THIAMINE HCL 100 MG/ML IJ SOLN
250.0000 mL/h | Freq: Once | INTRAVENOUS | Status: DC
  Filled 2012-10-30: qty 1000

## 2012-10-30 MED ORDER — THIAMINE HCL 100 MG/ML IJ SOLN
Freq: Once | INTRAVENOUS | Status: DC
  Administered 2012-10-30: 11:00:00 via INTRAVENOUS

## 2012-10-30 MED ORDER — CALCIUM CARBONATE ANTACID 500 MG PO CHEW: 6.0000 | CHEWABLE_TABLET | Freq: Three times a day (TID) | ORAL | Status: DC

## 2012-10-30 NOTE — Progress Notes (Signed)
Case discussed with Dr. Ziemer immediately after the resident saw the patient.  We reviewed the resident's history and exam and pertinent patient test results.  I agree with the assessment, diagnosis and plan of care documented in the resident's note. 

## 2012-10-30 NOTE — Assessment & Plan Note (Addendum)
Acute on chronic hypocalcemia in the setting of malnutrition, alcohol abuse, vitamin D deficiency. Calcium 6.0, corrects to 7.4 w albumin. No significant QT prolongation on EKG. No significant symptamotology of hypocalcemia here.  - Stat Bmet, Mg - Gave 4g IV Mg w thiamine and folate here - Instructed pt to continue TID magnesium - TUMS 6 tablets TID (1200mg  elemental calcium TID) - Stop Lasix - Call clinic or go to ED if symptoms of hypocalcemia or feeling worse - Return to clinic in 1 week.

## 2012-10-30 NOTE — Assessment & Plan Note (Signed)
Vit D <10.Contributing to hypoCa. Continue high dose 50K units weekly.

## 2012-10-30 NOTE — Telephone Encounter (Signed)
  INTERNAL MEDICINE RESIDENCY PROGRAM After-Hours Telephone Call    Details of incoming call:   I received a correspondence from Dr. Dalphine Handing regarding a critical lab value of Mg 0.6 and Ca of 6, which was ordered on 10/29/2012 as part of routine lab check, for evaluation of the pt's chronic hypomagnesemia and hypocalcemia - in the setting of her severe alcohol abuse and resultant malnutrition. I was requested to contact for ED evaluation / Tx of this hypomagnesemia.   I spoke with Ms. [name], who indicates she did experience N/V/D x 2 episodes occuring a few days ago, self-resolving, without recurrence. As well, she has fatigue. Otherwise, she remains without CP, SOB, palpitations, abd pain, N/V/D/Fevers/chills on the day of our discussion. She indicates compliance with her Mg TID supplementation.     Important Historical Information:   During pt's admission from 3/5-08/09/2012 (for anasarca of unclear etiology), the pt's Mg was discontinued at discharge for unclear reasons. Seems that this medication was just resumed at her PCP followup visit on 10/08/2012 (at least per chart review)    Pertinent Labs:  Basic Metabolic Panel:    Component Value Date/Time   NA 131* 10/29/2012 0842   K 4.1 10/29/2012 0842   CL 103 10/29/2012 0842   CO2 13* 10/29/2012 0842   BUN 16 10/29/2012 0842   CREATININE 1.29* 10/29/2012 0842   GLUCOSE 87 10/29/2012 0842   CALCIUM 6.0* 10/29/2012 0842    Ref. Range 10/29/2012  Magnesium Latest Range: 1.5-2.5 mg/dL 0.6 (LL)    Ref. Range 10/08/2012 09:32  Vit D, 25-Hydroxy Latest Range: 30-89 ng/mL <10 (L)   Component Value Date   TSH 1.571 10/08/2012   Component Value Date   PTH 61.6 04/03/2012      Assessment / Plan:  Assessment:  Hypomagnesemia - chronic hypomagnesemia in setting prior severe alcohol abuse. Likely worsened 2/2 prolonged discontinuation of her oral Mg supplementation, then recent V/D contributing towards further losses.  Hypocalcemia -  acute on chronic hypocalcemia in setting of severe vitamin D deficiency, for which she has been started on high dose vitamin D in 10/2012.    Plan:  I did place a call to Ms. [name], with the following outcome: advised the pt to present to the ED for lab recheck and consideration to receive IV magnesium and possible IV calcium supplementation in the ED.  Pt agrees to the recommendation and will present to the ED tonight.  Of note, if decision is made to DC from the ED after IV supplementation (as appropriate), the pt does already have [facility] appointment on 10/30/2012, at 8:15AM.   As always, pt is advised that if symptoms worsen or new symptoms arise, they should go to an urgent care facility or to to ER for further evaluation.    Priscella Mann, DO  10/30/2012, 12:25 AM

## 2012-10-30 NOTE — Assessment & Plan Note (Signed)
Contribution to chronic hypoCa and hypoMg. Also noted to have AG 16 w HCO3 13, likely related to starvation vs EtOH. - Emphasized EtOH cessation, counseled on nutritional intake. She is not taking ensure supplementation. Will review next week again.  - repeat Bmet next week

## 2012-10-30 NOTE — Assessment & Plan Note (Signed)
Acute on chronic in setting of chronic alcohol abuse, severe malnutrition, and recent discontinuation of oral supplementation. . - Stat Mg - 4g IV Mg here - 400mg  TID Mg as outpatient - Follow up in 1 week

## 2012-10-30 NOTE — Progress Notes (Signed)
Patient ID: [name], female   DOB: 15-Sep-1955, 57 y.o.   MRN: [id number]  Subjective:   Patient ID: TAYTEM GHATTAS female   DOB: 1955-12-13 57 y.o.   MRN: [id number]  HPI: Ms.Aisia Kirtland Bouchard Odden is a 57 y.o. female presenting for acute follow up visit for critical lab values of Mg 0.6 and Ca of 6.0 ordered on 10/29/2012 as part of routine lab check in setting of chronic hypomagnesemia and hypocalcemia. The on-call nighttime resident was contacted regarding critical lab values and called pt to recommend going to ED for IV magnesium. Patient was called, and said that she felt fatigued but otherwise fine and did not want to go to the ED, preferring to come to clinic this am.  She has chronic hypomagnesemia and chronic hypocalcemia in the setting of severe alcohol abuse/malnutrition as well as severe Vit D deficiency. She was supposed to be taking BID magnesium supplements which were discontinued after hospitalization in March and only restarted earlier this month. She was started on weekly high dose Vit D earlier this month for 25- Vit D level of <10. She does have chronic kidney disease.  She reports that a few days a go she had self-limited N/V/D which has since resolved. She feels fatigued but otherwise has no complaints. She denies any chest pain, dyspnea, dizziness, palpitations, abdominal pain, muscle spasms, seizures.    Past Medical History  Diagnosis Date  . Anemia, B12 deficiency   . History of acute pancreatitis   . Right knee pain     No recent imaging on chart  . 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 Dr. Su Hilt, [facility]  . Hypertriglyceridemia   . GERD (gastroesophageal reflux disease)   . Vitamin D deficiency   . Subdural hematoma 02/2008    Likely 2/2 trauma from seizure from EtOH withdrawal, chronic in nature, sees Dr. Robyne Askew. Most recent CT head 10/2009 showing stable but persistent hematoma without  mass effect.  . History of seizure disorder     Likely 2/2 alcohol abuse  . Hypocalcemia   . Hypomagnesemia   . Failure to thrive in childhood     Unclear etiology  . HTN (hypertension)   . Thrombocytopenia   . Anemia, macrocytic   . Hepatomegaly     On exam  . Alcohol abuse   . Joint pain   . Alcohol abuse   . Arthritis   . Vitamin D deficiency   . Menopause   . Pancreatitis   . Insomnia   . Hyperlipidemia   . Sinusitis   . Pernicious anemia   . Subdural hematoma   . Macrocytic anemia   . Hepatomegaly   . 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 Seizures     1.5 YRS  LAST ONE  . Depression   . Hepatitis     HEPATOMEGALY   . Fx humeral neck 04/17/2011    Transverse fracture- minimally displaced- managed as outpatient   . ABNORMAL PAP SMEAR, LGSIL 07/23/2008    Annotation: HPV positive CIN I Dr. Su Hilt, [facility] Qualifier: Diagnosis of  By: Danae Chen    . Pneumonia 05/20/2012   Current Outpatient Prescriptions  Medication Sig Dispense Refill  . amLODipine (NORVASC) 10 MG tablet Take 1 tablet (10 mg total) by mouth daily.  30 tablet  3  . amylase-lipase-protease (PANGESTYME EC) 20-4.5-25 MU per capsule Take 1 capsule  by mouth 3 (three) times daily with meals.  90 capsule  4  . cetirizine (ZYRTEC) 10 MG tablet Take 1 tablet (10 mg total) by mouth daily.  15 tablet  0  . diclofenac sodium (VOLTAREN) 1 % GEL APPLY TO AFFECTED AREA 4 TIMES A DAY AS NEEDED  100 g  2  . FLUoxetine (PROZAC) 10 MG capsule Take 1 capsule (10 mg total) by mouth daily.  30 capsule  3  . folic acid (FOLVITE) 1 MG tablet Take 1 tablet (1 mg total) by mouth daily.  30 tablet  1  . furosemide (LASIX) 20 MG tablet Take 1 tablet (20 mg total) by mouth daily.  30 tablet  0  . gabapentin (NEURONTIN) 300 MG capsule Take 2 capsules (600 mg total) by mouth 3 (three) times daily.  90 capsule  2  . hydrochlorothiazide (HYDRODIURIL) 25 MG tablet Take 1 tablet (25 mg total) by mouth  daily.  30 tablet  3  . KLOR-CON M20 20 MEQ tablet       . levETIRAcetam (KEPPRA) 500 MG tablet Take 500 mg by mouth 2 (two) times daily.      . magnesium oxide (MAG-OX) 400 MG tablet Take 1 tablet (400 mg total) by mouth 2 (two) times daily.  60 tablet  3  . Multiple Vitamin (MULITIVITAMIN WITH MINERALS) TABS Take 1 tablet by mouth daily.  30 tablet  11  . omeprazole (PRILOSEC) 40 MG capsule Take 1 capsule (40 mg total) by mouth daily.  30 capsule  3  . thiamine (VITAMIN B-1) 100 MG tablet Take 1 tablet (100 mg total) by mouth daily.  90 tablet  3  . vitamin B-12 (CYANOCOBALAMIN) 250 MCG tablet Take 1 tablet (250 mcg total) by mouth daily.      . Vitamin D, Ergocalciferol, (DRISDOL) 50000 UNITS CAPS Take 1 capsule (50,000 Units total) by mouth every 7 (seven) days.  10 capsule  3   No current facility-administered medications for this visit.   Family History  Problem Relation Age of Onset  . Cancer Mother     Died from stomach cancer and "flesh eating rash  . Heart failure Father     Died in 75s from an MI  . Alcohol abuse Sister     Twin sister drinks a lot, as did both her parents and brothers  . Stroke Brother     Has 7 brothers, 1 with CVA   History   Social History  . Marital Status: Divorced    Spouse Name: N/A    Number of Children: N/A  . Years of Education: N/A   Social History Main Topics  . Smoking status: Former Smoker    Types: Cigarettes    Quit date: 09/20/2010  . Smokeless tobacco: Never Used  . Alcohol Use: No     Comment: No alocohol x 3 weeks  . Drug Use: No     Comment: HX  USE   . Sexually Active: Not Currently   Other Topics Concern  . None   Social History Narrative   Lives with her significant other and 2 grandchildren. 1 child   Has 7 brothers and 4 sisters, 1 twin sister.   Unemployed, worked in [employer].    Abuses alcohol-drinks 1 glass of wine daily    No drug use. Former cigarette use quit 1.5 years ago.     11 th grade education             Review  of Systems: 10 pt ROS performed, pertinent positives and negatives noted in HPI Objective:  Physical Exam: Filed Vitals:   10/30/12 0819  BP: 136/87  Pulse: 86  Temp: 97.2 F (36.2 C)  TempSrc: Oral  Height: 5\' 1"  (1.549 m)  Weight: 83 lb 3.2 oz (37.739 kg)  SpO2: 96%   Constitutional: Vital signs reviewed.  Very thin female in NAD. Head: Normocephalic and atraumatic Mouth: poor/absent dentition. no erythema or exudates, MMM Eyes: PERRL, EOMI, conjunctivae normal, No scleral icterus.  Neck: Supple, Trachea midline normal ROM; no masses Cardiovascular: RRR, S1 normal, S2 normal, no MRG, pulses symmetric and intact bilaterally Pulmonary/Chest: CTAB, no wheezes, rales, or rhonchi Abdominal: Soft. Non-tender, non-distended, bowel sounds are normal, no masses, organomegaly, or guarding present.  GU: no CVA tenderness Musculoskeletal: No joint deformities, erythema, or stiffness, ROM full and no nontender Hematology: no cervical adenopathy or visible bruising Neurological: A&O x3, Strength is normal and symmetric bilaterally, cranial nerve II-XII are grossly intact, no focal motor deficit, sensory intact to light touch bilaterally. Reflexes are 2+ and symmetric brachioradialis, biceps, patellar, achilles. Negative Chovstek's sign.  Skin: Thinning hair of scalp.  Psychiatric: Normal mood and affect.  ECG REPORT  Date: 10/30/2012  EKG Time: 9:34 AM  Rate: 85  Rhythm: Normal sinus  Axis: normal  Intervals:QTC 495.   ST&T Change: No TWI or ST segement elevation or depressions.   Narrative Interpretation: Normal sinus rhythm with borderline prolonged QTC ( )            Assessment & Plan:   Please see problem-based charting for assessment and plan.

## 2012-10-30 NOTE — Progress Notes (Signed)
[name], female   DOB: 1955-09-29, 57 y.o.   MRN: [id number] Iv started in rt. Ant wrist with 22g x1" cath. On second attempt. Patient tolerated well.

## 2012-10-30 NOTE — Telephone Encounter (Addendum)
  INTERNAL MEDICINE RESIDENCY PROGRAM After-Hours Telephone Call    Reason for call:   I placed an outgoing call to [name] at 6:56 AM, 10/30/2012  Since she did not go to the ER as she agreed to do when I called her earlier in the evening. The pt indicates that she is still feeling fine, but her daughter had her car and therefore, she was unable to present last night (although she assured me at the time that she would be able to do so).     Assessment/ Plan:   Pt's appointment is in 1 hour from now - therefore, I advised her NOT to miss this appointment under any circumstances. Specifically, we again reviewed the risks of untreated hypomagnesemia and hypocalcemia.  Pt verbally expresses understanding and promises to come to the appointment.  I will forward this note to Dr. Loura Pardon, who will see her in clinic this AM.    Priscella Mann, DO   10/30/2012, 6:56 AM

## 2012-10-30 NOTE — Progress Notes (Signed)
Attempted IV start in left hand X 2, unsuccessful. IV team called

## 2012-10-30 NOTE — Patient Instructions (Addendum)
1. Keep taking your Magnesium three times a day - VERY IMPORTANT. Keep taking your vitamin D. STOP TAKING lasix.  2. Start taking Tums for calcium. You can take 6 Tums three times a day 3. Come back next week to see me again for follow up. Please call the clinic or go to the ER if you start feeling more weak, have muscle spasm, seizure, feel chest pain or funny heart beat.

## 2012-11-01 ENCOUNTER — Other Ambulatory Visit: Payer: Self-pay | Admitting: *Deleted

## 2012-11-02 MED ORDER — CETIRIZINE HCL 10 MG PO TABS: 10.0000 mg | ORAL_TABLET | Freq: Every day | ORAL | Status: DC

## 2012-11-03 ENCOUNTER — Other Ambulatory Visit: Payer: Self-pay | Admitting: Internal Medicine

## 2012-11-06 ENCOUNTER — Ambulatory Visit (INDEPENDENT_AMBULATORY_CARE_PROVIDER_SITE_OTHER): Payer: PRIVATE HEALTH INSURANCE | Admitting: Internal Medicine

## 2012-11-06 ENCOUNTER — Inpatient Hospital Stay (HOSPITAL_COMMUNITY)
Admission: AD | Admit: 2012-11-06 | Discharge: 2012-11-11 | DRG: 897 | Disposition: A | Discharge status: Home / Self Care | Payer: PRIVATE HEALTH INSURANCE | Source: Intra-hospital | Attending: Psychiatry | Admitting: Psychiatry

## 2012-11-06 ENCOUNTER — Encounter (HOSPITAL_COMMUNITY): Payer: Self-pay | Admitting: Emergency Medicine

## 2012-11-06 ENCOUNTER — Encounter (HOSPITAL_COMMUNITY): Payer: Self-pay

## 2012-11-06 ENCOUNTER — Encounter: Payer: Self-pay | Admitting: Internal Medicine

## 2012-11-06 ENCOUNTER — Emergency Department (HOSPITAL_COMMUNITY): Payer: PRIVATE HEALTH INSURANCE

## 2012-11-06 ENCOUNTER — Emergency Department (HOSPITAL_COMMUNITY)
Admission: EM | Admit: 2012-11-06 | Discharge: 2012-11-06 | Disposition: A | Discharge status: Other Acute Inpatient Hospital | Payer: PRIVATE HEALTH INSURANCE | Attending: Emergency Medicine | Admitting: Emergency Medicine

## 2012-11-06 DIAGNOSIS — Z87891 Personal history of nicotine dependence: Secondary | ICD-10-CM | POA: Insufficient documentation

## 2012-11-06 DIAGNOSIS — S5002XA Contusion of left elbow, initial encounter: Secondary | ICD-10-CM

## 2012-11-06 DIAGNOSIS — I10 Essential (primary) hypertension: Secondary | ICD-10-CM | POA: Diagnosis present

## 2012-11-06 DIAGNOSIS — F329 Major depressive disorder, single episode, unspecified: Secondary | ICD-10-CM

## 2012-11-06 DIAGNOSIS — Z79899 Other long term (current) drug therapy: Secondary | ICD-10-CM | POA: Diagnosis not present

## 2012-11-06 DIAGNOSIS — E46 Unspecified protein-calorie malnutrition: Secondary | ICD-10-CM | POA: Insufficient documentation

## 2012-11-06 DIAGNOSIS — E559 Vitamin D deficiency, unspecified: Secondary | ICD-10-CM | POA: Insufficient documentation

## 2012-11-06 DIAGNOSIS — F1020 Alcohol dependence, uncomplicated: Secondary | ICD-10-CM | POA: Diagnosis present

## 2012-11-06 DIAGNOSIS — K219 Gastro-esophageal reflux disease without esophagitis: Secondary | ICD-10-CM | POA: Insufficient documentation

## 2012-11-06 DIAGNOSIS — E43 Unspecified severe protein-calorie malnutrition: Secondary | ICD-10-CM

## 2012-11-06 DIAGNOSIS — F1010 Alcohol abuse, uncomplicated: Secondary | ICD-10-CM

## 2012-11-06 DIAGNOSIS — Z8639 Personal history of other endocrine, nutritional and metabolic disease: Secondary | ICD-10-CM | POA: Insufficient documentation

## 2012-11-06 DIAGNOSIS — Z8739 Personal history of other diseases of the musculoskeletal system and connective tissue: Secondary | ICD-10-CM | POA: Insufficient documentation

## 2012-11-06 DIAGNOSIS — D518 Other vitamin B12 deficiency anemias: Secondary | ICD-10-CM | POA: Insufficient documentation

## 2012-11-06 DIAGNOSIS — F3289 Other specified depressive episodes: Secondary | ICD-10-CM | POA: Insufficient documentation

## 2012-11-06 DIAGNOSIS — S5000XA Contusion of unspecified elbow, initial encounter: Secondary | ICD-10-CM | POA: Insufficient documentation

## 2012-11-06 DIAGNOSIS — X58XXXA Exposure to other specified factors, initial encounter: Secondary | ICD-10-CM | POA: Insufficient documentation

## 2012-11-06 DIAGNOSIS — G40909 Epilepsy, unspecified, not intractable, without status epilepticus: Secondary | ICD-10-CM | POA: Insufficient documentation

## 2012-11-06 DIAGNOSIS — Y929 Unspecified place or not applicable: Secondary | ICD-10-CM | POA: Insufficient documentation

## 2012-11-06 DIAGNOSIS — Z8719 Personal history of other diseases of the digestive system: Secondary | ICD-10-CM | POA: Insufficient documentation

## 2012-11-06 DIAGNOSIS — Z8679 Personal history of other diseases of the circulatory system: Secondary | ICD-10-CM | POA: Insufficient documentation

## 2012-11-06 DIAGNOSIS — Z8742 Personal history of other diseases of the female genital tract: Secondary | ICD-10-CM | POA: Insufficient documentation

## 2012-11-06 DIAGNOSIS — Z8701 Personal history of pneumonia (recurrent): Secondary | ICD-10-CM | POA: Insufficient documentation

## 2012-11-06 DIAGNOSIS — Z8619 Personal history of other infectious and parasitic diseases: Secondary | ICD-10-CM | POA: Insufficient documentation

## 2012-11-06 DIAGNOSIS — Z862 Personal history of diseases of the blood and blood-forming organs and certain disorders involving the immune mechanism: Secondary | ICD-10-CM | POA: Insufficient documentation

## 2012-11-06 DIAGNOSIS — Y939 Activity, unspecified: Secondary | ICD-10-CM | POA: Insufficient documentation

## 2012-11-06 DIAGNOSIS — Z8781 Personal history of (healed) traumatic fracture: Secondary | ICD-10-CM | POA: Insufficient documentation

## 2012-11-06 LAB — COMPREHENSIVE METABOLIC PANEL
Albumin: 2 g/dL — ABNORMAL LOW (ref 3.5–5.2)
BUN: 14 mg/dL (ref 6–23)
Chloride: 105 mEq/L (ref 96–112)
Creatinine, Ser: 1.01 mg/dL (ref 0.50–1.10)
GFR calc Af Amer: 71 mL/min — ABNORMAL LOW (ref >90)
GFR calc non Af Amer: 61 mL/min — ABNORMAL LOW (ref >90)
Total Bilirubin: 0.2 mg/dL — ABNORMAL LOW (ref 0.3–1.2)

## 2012-11-06 LAB — CBC WITH DIFFERENTIAL/PLATELET
Basophils Absolute: 0 10*3/uL (ref 0.0–0.1)
HCT: 29.9 % — ABNORMAL LOW (ref 36.0–46.0)
Lymphocytes Relative: 43 % (ref 12–46)
Neutro Abs: 2.6 10*3/uL (ref 1.7–7.7)
Neutrophils Relative %: 34 % — ABNORMAL LOW (ref 43–77)
Platelets: 208 10*3/uL (ref 150–400)
RDW: 16.1 % — ABNORMAL HIGH (ref 11.5–15.5)
WBC: 7.5 10*3/uL (ref 4.0–10.5)

## 2012-11-06 LAB — BASIC METABOLIC PANEL
BUN: 15 mg/dL (ref 6–23)
Calcium: 7.8 mg/dL — ABNORMAL LOW (ref 8.4–10.5)
Creat: 1.1 mg/dL (ref 0.50–1.10)
Glucose, Bld: 143 mg/dL — ABNORMAL HIGH (ref 70–99)
Sodium: 136 mEq/L (ref 135–145)

## 2012-11-06 LAB — ETHANOL: Alcohol, Ethyl (B): 247 mg/dL — ABNORMAL HIGH (ref 0–11)

## 2012-11-06 LAB — RAPID URINE DRUG SCREEN, HOSP PERFORMED
Amphetamines: NOT DETECTED
Cocaine: NOT DETECTED
Opiates: NOT DETECTED
Tetrahydrocannabinol: NOT DETECTED

## 2012-11-06 MED ORDER — HYDROXYZINE HCL 25 MG PO TABS: 25.0000 mg | ORAL_TABLET | Freq: Four times a day (QID) | ORAL | Status: AC | PRN

## 2012-11-06 MED ORDER — LEVETIRACETAM 500 MG PO TABS
500.0000 mg | ORAL_TABLET | Freq: Two times a day (BID) | ORAL | Status: DC
  Administered 2012-11-06: 500 mg via ORAL
  Filled 2012-11-06 (×2): qty 1

## 2012-11-06 MED ORDER — LORAZEPAM 2 MG/ML IJ SOLN: 1.0000 mg | Freq: Four times a day (QID) | INTRAMUSCULAR | Status: DC | PRN

## 2012-11-06 MED ORDER — FOLIC ACID 1 MG PO TABS
1.0000 mg | ORAL_TABLET | Freq: Every day | ORAL | Status: DC
  Administered 2012-11-06: 1 mg via ORAL
  Filled 2012-11-06: qty 1

## 2012-11-06 MED ORDER — LORAZEPAM 1 MG PO TABS: 0.0000 mg | ORAL_TABLET | Freq: Four times a day (QID) | ORAL | Status: DC

## 2012-11-06 MED ORDER — MAGNESIUM OXIDE 400 (241.3 MG) MG PO TABS
400.0000 mg | ORAL_TABLET | Freq: Three times a day (TID) | ORAL | Status: DC
  Administered 2012-11-06: 400 mg via ORAL
  Filled 2012-11-06 (×2): qty 1

## 2012-11-06 MED ORDER — ACETAMINOPHEN 325 MG PO TABS: 650.0000 mg | ORAL_TABLET | ORAL | Status: DC | PRN

## 2012-11-06 MED ORDER — CHLORDIAZEPOXIDE HCL 25 MG PO CAPS: 25.0000 mg | ORAL_CAPSULE | Freq: Four times a day (QID) | ORAL | Status: AC | PRN

## 2012-11-06 MED ORDER — ONDANSETRON 4 MG PO TBDP: 4.0000 mg | ORAL_TABLET | Freq: Four times a day (QID) | ORAL | Status: AC | PRN

## 2012-11-06 MED ORDER — CHLORDIAZEPOXIDE HCL 25 MG PO CAPS: 25.0000 mg | ORAL_CAPSULE | Freq: Once | ORAL | Status: DC

## 2012-11-06 MED ORDER — AMLODIPINE BESYLATE 10 MG PO TABS
10.0000 mg | ORAL_TABLET | Freq: Every day | ORAL | Status: DC
  Administered 2012-11-07 – 2012-11-11 (×5): 10 mg via ORAL
  Filled 2012-11-06: qty 1
  Filled 2012-11-06: qty 4
  Filled 2012-11-06 (×5): qty 1

## 2012-11-06 MED ORDER — THIAMINE HCL 100 MG/ML IJ SOLN: 100.0000 mg | Freq: Once | INTRAMUSCULAR | Status: DC

## 2012-11-06 MED ORDER — ADULT MULTIVITAMIN W/MINERALS CH: 1.0000 | ORAL_TABLET | Freq: Every day | ORAL | Status: DC

## 2012-11-06 MED ORDER — ALUM & MAG HYDROXIDE-SIMETH 200-200-20 MG/5ML PO SUSP: 30.0000 mL | ORAL | Status: DC | PRN

## 2012-11-06 MED ORDER — GABAPENTIN 300 MG PO CAPS
600.0000 mg | ORAL_CAPSULE | Freq: Three times a day (TID) | ORAL | Status: DC
  Administered 2012-11-07 – 2012-11-11 (×14): 600 mg via ORAL
  Filled 2012-11-06 (×10): qty 2
  Filled 2012-11-06: qty 24
  Filled 2012-11-06 (×3): qty 2
  Filled 2012-11-06: qty 24
  Filled 2012-11-06 (×3): qty 2
  Filled 2012-11-06: qty 24

## 2012-11-06 MED ORDER — HYDROCHLOROTHIAZIDE 25 MG PO TABS: 25.0000 mg | ORAL_TABLET | Freq: Every day | ORAL | Status: DC

## 2012-11-06 MED ORDER — LORAZEPAM 1 MG PO TABS: 0.0000 mg | ORAL_TABLET | Freq: Two times a day (BID) | ORAL | Status: DC

## 2012-11-06 MED ORDER — VITAMIN B-1 100 MG PO TABS
100.0000 mg | ORAL_TABLET | Freq: Every day | ORAL | Status: DC
  Administered 2012-11-07 – 2012-11-11 (×5): 100 mg via ORAL
  Filled 2012-11-06 (×7): qty 1

## 2012-11-06 MED ORDER — HYDROCHLOROTHIAZIDE 25 MG PO TABS
25.0000 mg | ORAL_TABLET | Freq: Every day | ORAL | Status: DC
  Administered 2012-11-07 – 2012-11-11 (×5): 25 mg via ORAL
  Filled 2012-11-06: qty 1
  Filled 2012-11-06: qty 4
  Filled 2012-11-06 (×5): qty 1

## 2012-11-06 MED ORDER — CHLORDIAZEPOXIDE HCL 25 MG PO CAPS: 25.0000 mg | ORAL_CAPSULE | Freq: Every day | ORAL | Status: DC

## 2012-11-06 MED ORDER — VITAMIN B-1 100 MG PO TABS
100.0000 mg | ORAL_TABLET | Freq: Every day | ORAL | Status: DC
  Administered 2012-11-06: 100 mg via ORAL
  Filled 2012-11-06: qty 1

## 2012-11-06 MED ORDER — GABAPENTIN 300 MG PO CAPS
600.0000 mg | ORAL_CAPSULE | Freq: Three times a day (TID) | ORAL | Status: DC
  Administered 2012-11-06: 600 mg via ORAL
  Filled 2012-11-06 (×2): qty 2

## 2012-11-06 MED ORDER — AMYLASE-LIPASE-PROTEASE 20-4.5-25 MU PO CPEP: 1.0000 | ORAL_CAPSULE | Freq: Three times a day (TID) | ORAL | Status: DC

## 2012-11-06 MED ORDER — ADULT MULTIVITAMIN W/MINERALS CH
1.0000 | ORAL_TABLET | Freq: Every day | ORAL | Status: DC
  Administered 2012-11-07 – 2012-11-11 (×5): 1 via ORAL
  Filled 2012-11-06 (×7): qty 1

## 2012-11-06 MED ORDER — CHLORDIAZEPOXIDE HCL 25 MG PO CAPS
25.0000 mg | ORAL_CAPSULE | ORAL | Status: AC
  Administered 2012-11-09 (×2): 25 mg via ORAL
  Filled 2012-11-06 (×2): qty 1

## 2012-11-06 MED ORDER — FLUOXETINE HCL 10 MG PO CAPS
10.0000 mg | ORAL_CAPSULE | Freq: Every day | ORAL | Status: DC
  Administered 2012-11-06: 10 mg via ORAL
  Filled 2012-11-06: qty 1

## 2012-11-06 MED ORDER — ONDANSETRON HCL 4 MG PO TABS: 4.0000 mg | ORAL_TABLET | Freq: Three times a day (TID) | ORAL | Status: DC | PRN

## 2012-11-06 MED ORDER — POTASSIUM CHLORIDE CRYS ER 20 MEQ PO TBCR
20.0000 meq | EXTENDED_RELEASE_TABLET | Freq: Two times a day (BID) | ORAL | Status: DC
  Administered 2012-11-06: 20 meq via ORAL
  Filled 2012-11-06: qty 1

## 2012-11-06 MED ORDER — ADULT MULTIVITAMIN W/MINERALS CH
1.0000 | ORAL_TABLET | Freq: Every day | ORAL | Status: DC
  Administered 2012-11-06: 1 via ORAL
  Filled 2012-11-06: qty 1

## 2012-11-06 MED ORDER — MAGNESIUM HYDROXIDE 400 MG/5ML PO SUSP: 30.0000 mL | Freq: Every day | ORAL | Status: DC | PRN

## 2012-11-06 MED ORDER — LOPERAMIDE HCL 2 MG PO CAPS: 2.0000 mg | ORAL_CAPSULE | ORAL | Status: AC | PRN

## 2012-11-06 MED ORDER — CHLORDIAZEPOXIDE HCL 25 MG PO CAPS: 25.0000 mg | ORAL_CAPSULE | Freq: Four times a day (QID) | ORAL | Status: DC

## 2012-11-06 MED ORDER — AMLODIPINE BESYLATE 10 MG PO TABS
10.0000 mg | ORAL_TABLET | Freq: Every day | ORAL | Status: DC
  Administered 2012-11-06: 10 mg via ORAL
  Filled 2012-11-06: qty 1

## 2012-11-06 MED ORDER — LEVETIRACETAM 500 MG PO TABS
500.0000 mg | ORAL_TABLET | Freq: Two times a day (BID) | ORAL | Status: DC
  Administered 2012-11-06 – 2012-11-11 (×10): 500 mg via ORAL
  Filled 2012-11-06 (×3): qty 1
  Filled 2012-11-06: qty 8
  Filled 2012-11-06: qty 1
  Filled 2012-11-06: qty 8
  Filled 2012-11-06 (×8): qty 1
  Filled 2012-11-06: qty 2

## 2012-11-06 MED ORDER — FLUOXETINE HCL 10 MG PO CAPS
10.0000 mg | ORAL_CAPSULE | Freq: Every day | ORAL | Status: DC
  Administered 2012-11-07 – 2012-11-11 (×5): 10 mg via ORAL
  Filled 2012-11-06 (×5): qty 1
  Filled 2012-11-06: qty 4
  Filled 2012-11-06: qty 1

## 2012-11-06 MED ORDER — CHLORDIAZEPOXIDE HCL 25 MG PO CAPS
25.0000 mg | ORAL_CAPSULE | Freq: Three times a day (TID) | ORAL | Status: AC
  Administered 2012-11-08 (×3): 25 mg via ORAL
  Filled 2012-11-06 (×3): qty 1

## 2012-11-06 MED ORDER — CHLORDIAZEPOXIDE HCL 25 MG PO CAPS: 25.0000 mg | ORAL_CAPSULE | Freq: Three times a day (TID) | ORAL | Status: DC

## 2012-11-06 MED ORDER — CHLORDIAZEPOXIDE HCL 25 MG PO CAPS: 25.0000 mg | ORAL_CAPSULE | ORAL | Status: DC

## 2012-11-06 MED ORDER — POTASSIUM CHLORIDE CRYS ER 20 MEQ PO TBCR
20.0000 meq | EXTENDED_RELEASE_TABLET | Freq: Two times a day (BID) | ORAL | Status: DC
  Administered 2012-11-06 – 2012-11-07 (×2): 20 meq via ORAL
  Filled 2012-11-06 (×7): qty 1

## 2012-11-06 MED ORDER — THIAMINE HCL 100 MG/ML IJ SOLN: 100.0000 mg | Freq: Every day | INTRAMUSCULAR | Status: DC

## 2012-11-06 MED ORDER — LORAZEPAM 1 MG PO TABS: 1.0000 mg | ORAL_TABLET | Freq: Four times a day (QID) | ORAL | Status: DC | PRN

## 2012-11-06 MED ORDER — ACETAMINOPHEN 325 MG PO TABS
650.0000 mg | ORAL_TABLET | Freq: Four times a day (QID) | ORAL | Status: DC | PRN
  Administered 2012-11-10: 650 mg via ORAL

## 2012-11-06 MED ORDER — MAGNESIUM OXIDE 400 MG PO TABS: 400.0000 mg | ORAL_TABLET | Freq: Three times a day (TID) | ORAL | Status: DC

## 2012-11-06 MED ORDER — ZOLPIDEM TARTRATE 5 MG PO TABS: 5.0000 mg | ORAL_TABLET | Freq: Every evening | ORAL | Status: DC | PRN

## 2012-11-06 MED ORDER — CALCIUM CARBONATE ANTACID 500 MG PO CHEW
6.0000 | CHEWABLE_TABLET | Freq: Three times a day (TID) | ORAL | Status: DC
  Administered 2012-11-07 – 2012-11-08 (×5): 1200 mg via ORAL
  Filled 2012-11-06 (×10): qty 6

## 2012-11-06 MED ORDER — CALCIUM CARBONATE ANTACID 500 MG PO CHEW
6.0000 | CHEWABLE_TABLET | Freq: Three times a day (TID) | ORAL | Status: DC
  Administered 2012-11-06: 1200 mg via ORAL
  Filled 2012-11-06 (×2): qty 6

## 2012-11-06 MED ORDER — VITAMIN D (ERGOCALCIFEROL) 1.25 MG (50000 UNIT) PO CAPS: 50000.0000 [IU] | ORAL_CAPSULE | ORAL | Status: DC

## 2012-11-06 MED ORDER — CHLORDIAZEPOXIDE HCL 25 MG PO CAPS
25.0000 mg | ORAL_CAPSULE | Freq: Every day | ORAL | Status: AC
  Administered 2012-11-10: 25 mg via ORAL
  Filled 2012-11-06: qty 1

## 2012-11-06 MED ORDER — VITAMIN D (ERGOCALCIFEROL) 1.25 MG (50000 UNIT) PO CAPS
50000.0000 [IU] | ORAL_CAPSULE | ORAL | Status: DC
  Administered 2012-11-06: 50000 [IU] via ORAL
  Filled 2012-11-06: qty 1

## 2012-11-06 MED ORDER — CHLORDIAZEPOXIDE HCL 25 MG PO CAPS
25.0000 mg | ORAL_CAPSULE | Freq: Four times a day (QID) | ORAL | Status: AC
  Administered 2012-11-06 – 2012-11-07 (×5): 25 mg via ORAL
  Filled 2012-11-06 (×5): qty 1

## 2012-11-06 MED ORDER — PANCRELIPASE (LIP-PROT-AMYL) 12000-38000 UNITS PO CPEP
1.0000 | ORAL_CAPSULE | Freq: Three times a day (TID) | ORAL | Status: DC
  Administered 2012-11-06 (×2): 1 via ORAL
  Filled 2012-11-06 (×3): qty 1

## 2012-11-06 NOTE — Progress Notes (Signed)
[name], female   DOB: 1955/11/04, 57 y.o.   MRN: [id number] Admission note: D:Patient is a  Voluntary admission in no acute distress for ETOH dependence and depression. Pt reported he has been drinking  1/5 of wine for 20 years. Pt stated everyone in her family drinks. Pt has a hx of failure to thrive in childhood. Pt reports depression due to two grandchildren she is raising and not been there for them. Pt reports a DUI court date on 10/30/2012 that she missed and her lawyer is working to get her another date. Pt stated her lawyer advised her to come for detox. Pt report a hx of seizure with last in 2013. Pt report of not eating well due of drinking. Pt denies SI/HI/AVH  A: Pt admitted to unit per protocol, skin assessment and belonging search done.  Pt educated on therapeutic milieu rules. Pt was introduced to milieu by nursing staff. Fall risk safety plan explained to the patient. Pt placed on high fall risk because of fall outside facility and hx of rt leg weakness.  15 minutes checks started for safety.   R: Pt was receptive to education. Writer offered support.

## 2012-11-06 NOTE — BHH Counselor (Signed)
Patient accepted to [facility] by Alvy Beal, NP to Dr. Hyman Hopes. Room 306-2

## 2012-11-06 NOTE — ED Notes (Signed)
Pt states that she was sent here from her doctor's office.  States that she has already been medically cleared by her doctor's office with lab work.  States that she has been drinking appx 6 bottles of wine for 25 years.  States she last drank 2 bottles last night.

## 2012-11-06 NOTE — ED Notes (Signed)
Attempted to obtain blood specimens unsuccessful.  Lab notified to come obtain specimens.

## 2012-11-06 NOTE — Assessment & Plan Note (Addendum)
Acute on chronic in setting of chronic alcohol abuse, severe malnutrition, and recent discontinuation of oral supplementation. Not acutely symptomatic. Taking tums supplementation.  Will likely slowly correct only with improvement of nutritional status, cessation of EtOH, and compliance with supplements.  - Instructed to fill Mg and take TID. - Continue TUMS - Hold lasix - EtOH detox. Medically clear.

## 2012-11-06 NOTE — Consult Note (Signed)
Reason for Consult: Evaluation for in patient treatment (detox) Referring Physician: EDP  [name] [name] is an 57 y.o. female.  HPI:  Patient present to [facility] wanting detox from alcohol.  Patient states that she is a daily drinker and is currently in the legal system related to DUI and has a court date set.  Patient states that she was told to come to hospital for alcohol detox.  Patient denies SI/HI/AVH.  Past Medical History  Diagnosis Date  . Anemia, B12 deficiency   . History of acute pancreatitis   . Right knee pain     No recent imaging on chart  . 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 Dr. Su Hilt, [facility]  . Hypertriglyceridemia   . GERD (gastroesophageal reflux disease)   . Vitamin D deficiency   . Subdural hematoma 02/2008    Likely 2/2 trauma from seizure from EtOH withdrawal, chronic in nature, sees Dr. Robyne Askew. Most recent CT head 10/2009 showing stable but persistent hematoma without mass effect.  . History of seizure disorder     Likely 2/2 alcohol abuse  . Hypocalcemia   . Hypomagnesemia   . Failure to thrive in childhood     Unclear etiology  . HTN (hypertension)   . Thrombocytopenia   . Anemia, macrocytic   . Hepatomegaly     On exam  . Alcohol abuse   . Joint pain   . Alcohol abuse   . Arthritis   . Vitamin D deficiency   . Menopause   . Pancreatitis   . Insomnia   . Hyperlipidemia   . Sinusitis   . Pernicious anemia   . Subdural hematoma   . Macrocytic anemia   . Hepatomegaly   . 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 Seizures     1.5 YRS  LAST ONE  . Depression   . Hepatitis     HEPATOMEGALY   . Fx humeral neck 04/17/2011    Transverse fracture- minimally displaced- managed as outpatient   . ABNORMAL PAP SMEAR, LGSIL 07/23/2008    Annotation: HPV positive CIN I Dr. Su Hilt, [facility] Qualifier: Diagnosis of  By: Danae Chen    . Pneumonia  05/20/2012    Past Surgical History  Procedure Laterality Date  . Cesarean section  1983  . Esophagogastroduodenoscopy  07/11/2011    Procedure: ESOPHAGOGASTRODUODENOSCOPY (EGD);  Surgeon: Theda Belfast, MD;  Location: [facility];  Service: Endoscopy;  Laterality: N/A;  . Colonoscopy  07/11/2011    Procedure: COLONOSCOPY;  Surgeon: Theda Belfast, MD;  Location: [facility];  Service: Endoscopy;  Laterality: N/A;  . Eye surgery      LEFT EYE YRS AGO   . Rt colectomy  08/28/2011    Family History  Problem Relation Age of Onset  . Cancer Mother     Died from stomach cancer and "flesh eating rash  . Heart failure Father     Died in 47s from an MI  . Alcohol abuse Sister     Twin sister drinks a lot, as did both her parents and brothers  . Stroke Brother     Has 7 brothers, 1 with CVA    Social History:  reports that she quit smoking about 2 years ago. Her smoking use included Cigarettes. She smoked 0.00 packs per day. She has never used smokeless tobacco. She reports that she does not drink  alcohol or use illicit drugs.  Allergies:  Allergies  Allergen Reactions  . Amitriptyline Hcl Swelling    In the face.  . Doxycycline Hyclate Itching    Feels like something crawling under her skin    Medications: I have reviewed the patient's current medications.  Results for orders placed during the hospital encounter of 11/06/12 (from the past 48 hour(s))  URINE RAPID DRUG SCREEN (HOSP PERFORMED)     Status: None   Collection Time    11/06/12 12:13 PM      Result Value Range   Opiates NONE DETECTED  NONE DETECTED   Cocaine NONE DETECTED  NONE DETECTED   Benzodiazepines NONE DETECTED  NONE DETECTED   Amphetamines NONE DETECTED  NONE DETECTED   Tetrahydrocannabinol NONE DETECTED  NONE DETECTED   Barbiturates NONE DETECTED  NONE DETECTED   Comment:            DRUG SCREEN FOR MEDICAL PURPOSES     ONLY.  IF CONFIRMATION IS NEEDED     FOR ANY PURPOSE, NOTIFY LAB     WITHIN 5 DAYS.                 LOWEST DETECTABLE LIMITS     FOR URINE DRUG SCREEN     Drug Class       Cutoff (ng/mL)     Amphetamine      1000     Barbiturate      200     Benzodiazepine   200     Tricyclics       300     Opiates          300     Cocaine          300     THC              50  COMPREHENSIVE METABOLIC PANEL     Status: Abnormal   Collection Time    11/06/12  1:42 PM      Result Value Range   Sodium 135  135 - 145 mEq/L   Potassium 4.2  3.5 - 5.1 mEq/L   Chloride 105  96 - 112 mEq/L   CO2 16 (*) 19 - 32 mEq/L   Glucose, Bld 110 (*) 70 - 99 mg/dL   BUN 14  6 - 23 mg/dL   Creatinine, Ser 7.82  0.50 - 1.10 mg/dL   Calcium 7.6 (*) 8.4 - 10.5 mg/dL   Total Protein 8.9 (*) 6.0 - 8.3 g/dL   Albumin 2.0 (*) 3.5 - 5.2 g/dL   AST 90 (*) 0 - 37 U/L   ALT 18  0 - 35 U/L   Alkaline Phosphatase 384 (*) 39 - 117 U/L   Total Bilirubin 0.2 (*) 0.3 - 1.2 mg/dL   GFR calc non Af Amer 61 (*) >90 mL/min   GFR calc Af Amer 71 (*) >90 mL/min   Comment:            The eGFR has been calculated     using the CKD EPI equation.     This calculation has not been     validated in all clinical     situations.     eGFR's persistently     <90 mL/min signify     possible Chronic Kidney Disease.  ETHANOL     Status: Abnormal   Collection Time    11/06/12  1:42 PM      Result Value Range  Alcohol, Ethyl (B) 247 (*) 0 - 11 mg/dL   Comment:            LOWEST DETECTABLE LIMIT FOR     SERUM ALCOHOL IS 11 mg/dL     FOR MEDICAL PURPOSES ONLY  CBC WITH DIFFERENTIAL     Status: Abnormal   Collection Time    11/06/12  1:42 PM      Result Value Range   WBC 7.5  4.0 - 10.5 K/uL   RBC 2.90 (*) 3.87 - 5.11 MIL/uL   Hemoglobin 9.8 (*) 12.0 - 15.0 g/dL   HCT 13.2 (*) 44.0 - 10.2 %   MCV 103.1 (*) 78.0 - 100.0 fL   MCH 33.8  26.0 - 34.0 pg   MCHC 32.8  30.0 - 36.0 g/dL   RDW 72.5 (*) 36.6 - 44.0 %   Platelets 208  150 - 400 K/uL   Neutrophils Relative % 34 (*) 43 - 77 %   Neutro Abs 2.6  1.7 - 7.7 K/uL    Lymphocytes Relative 43  12 - 46 %   Lymphs Abs 3.2  0.7 - 4.0 K/uL   Monocytes Relative 21 (*) 3 - 12 %   Monocytes Absolute 1.6 (*) 0.1 - 1.0 K/uL   Eosinophils Relative 1  0 - 5 %   Eosinophils Absolute 0.1  0.0 - 0.7 K/uL   Basophils Relative 1  0 - 1 %   Basophils Absolute 0.0  0.0 - 0.1 K/uL    Dg Elbow Complete Left  11/06/2012   *RADIOLOGY REPORT*  Clinical Data: Posterior left elbow pain.  LEFT ELBOW - COMPLETE 3+ VIEW  Comparison: None.  Findings: There is an elbow joint effusion.  No fracture is identified.  The elbow is located.  Bones appear osteopenic.  No notable degenerative disease identified.  IMPRESSION: Elbow joint effusion could be due to a bone contusion or occult fracture.  Cause for the effusion is not identified.   Original Report Authenticated By: Holley Dexter, M.D.    Review of Systems  Musculoskeletal:       Left elbow pain  Psychiatric/Behavioral: Positive for depression and substance abuse. Negative for suicidal ideas, hallucinations and memory loss. The patient is not nervous/anxious and does not have insomnia.   All other systems reviewed and are negative.   Blood pressure 135/91, pulse 87, temperature 97.7 F (36.5 C), temperature source Oral, resp. rate 12, weight 37.649 kg (83 lb), SpO2 94.00%. Physical Exam  Constitutional: She is oriented to person, place, and time.  HENT:  Head: Normocephalic.  Eyes: Pupils are equal, round, and reactive to light.  Neck: Normal range of motion.  Cardiovascular: Normal rate.   Respiratory: Effort normal.  GI: Soft.  Musculoskeletal: Normal range of motion.  Neurological: She is alert and oriented to person, place, and time.  Skin: Skin is warm and dry.  Psychiatric: Her speech is normal and behavior is normal. She exhibits a depressed mood. She expresses no homicidal and no suicidal ideation.    Assessment/Plan:  Consulted with Dr. Lolly Mustache and face to face interview Recommendation in patient treatment for  alcohol detox Review and initiate  medications pertinent to patient illness and treatment.  Monitor patient every 15 minutes for safety.  Shuvon B. Rankin FNP-BC Family Nurse Practitioner, Board Certified   Rankin, Shuvon 11/06/2012, 3:35 PM     I am agreed with the findings and involve in the treatment plan.

## 2012-11-06 NOTE — ED Notes (Addendum)
Patient discharge via ambulatory with steady gait. No acute distress noted. 

## 2012-11-06 NOTE — Progress Notes (Signed)
INTERNAL MEDICINE TEACHING ATTENDING ADDENDUM: I discussed this case with Dr. Ziemer soon after the patient visit. I agree with her HPI, exam findings and diagnoses. I have read her documentation and I agree with her plan of care. Please see the resident note above for details of management.    

## 2012-11-06 NOTE — ED Notes (Signed)
Attempted x2 to draw blood on pt.  Blood draw was unsuccessful. Nurse was notified.

## 2012-11-06 NOTE — ED Provider Notes (Signed)
Patient presents requesting detox from alcohol. Last drink alcohol last night She presently feels well. Alert Glasgow Coma Score 15  Doug Sou, MD 11/06/12 1200

## 2012-11-06 NOTE — Assessment & Plan Note (Signed)
Chronic problem for patient. Will likely only improve with cessation of EtOH, improvement of nutritional status, and compliance w supplementation. Pt did not fill Rx for TID Mg at last visit. Did receive 4g IV Mg at last visit. - Repeat Bmet today - 400mg  Mg TID as outpatient - EtOH rehab

## 2012-11-06 NOTE — BH Assessment (Signed)
Assessment Note   [name] [name] is an 57 y.o. female. Patient here requesting detox from alcohol. She has a long history of alcohol dependence. She recently received a DUI and had a court date in which she missed 10/30/12.  Patient sts that her lawyer is working on another court date stating, "I think the next one is scheduled for August but I'm not sure".  Meanwhile, she was told by her lawyer to come here for detox. Patient started drinking at age 7. She has been drinking a fifth of wine daily for the past 20 yrs. Patients last drink was late last night and she drank 2-3 glasses of wine. Patient denies current withdrawal symptoms but typically has hot flashes when she doesn't drink. Patient also has a history of seizures. Her last seizure was ine 2013, stating she had 2 seizures that year. No history of black outs. Patient currently participating in AA support groups. She also has received detox at [facility] and residential treatment at [facility]. Patient unable to recall dates of detox/treatment stating it was yrs ago. She denies HI. She also denies AVH's. Patient reports mild depression due to her legal issues and chronic alcohol use stating she  is currently receiving outpatient therapy at [facility] . She is also seeing a psychiatrist at another outpatient clinic but unable to recall the name.  Axis I: Alcohol Dependence Axis II: Deferred Axis III:  Past Medical History  Diagnosis Date  . Anemia, B12 deficiency   . History of acute pancreatitis   . Right knee pain     No recent imaging on chart  . 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 Dr. Su Hilt, [facility]  . Hypertriglyceridemia   . GERD (gastroesophageal reflux disease)   . Vitamin D deficiency   . Subdural hematoma 02/2008    Likely 2/2 trauma from seizure from EtOH withdrawal, chronic in nature, sees Dr. Robyne Askew. Most recent CT head 10/2009 showing stable but  persistent hematoma without mass effect.  . History of seizure disorder     Likely 2/2 alcohol abuse  . Hypocalcemia   . Hypomagnesemia   . Failure to thrive in childhood     Unclear etiology  . HTN (hypertension)   . Thrombocytopenia   . Anemia, macrocytic   . Hepatomegaly     On exam  . Alcohol abuse   . Joint pain   . Alcohol abuse   . Arthritis   . Vitamin D deficiency   . Menopause   . Pancreatitis   . Insomnia   . Hyperlipidemia   . Sinusitis   . Pernicious anemia   . Subdural hematoma   . Macrocytic anemia   . Hepatomegaly   . 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 Seizures     1.5 YRS  LAST ONE  . Depression   . Hepatitis     HEPATOMEGALY   . Fx humeral neck 04/17/2011    Transverse fracture- minimally displaced- managed as outpatient   . ABNORMAL PAP SMEAR, LGSIL 07/23/2008    Annotation: HPV positive CIN I Dr. Su Hilt, [facility] Qualifier: Diagnosis of  By: Danae Chen    . Pneumonia 05/20/2012   Axis IV: other psychosocial or environmental problems, problems related to social environment, problems with access to health care services and problems with primary support group Axis V: 31-40 impairment in reality testing  Past Medical  History:  Past Medical History  Diagnosis Date  . Anemia, B12 deficiency   . History of acute pancreatitis   . Right knee pain     No recent imaging on chart  . 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 Dr. Su Hilt, [facility]  . Hypertriglyceridemia   . GERD (gastroesophageal reflux disease)   . Vitamin D deficiency   . Subdural hematoma 02/2008    Likely 2/2 trauma from seizure from EtOH withdrawal, chronic in nature, sees Dr. Robyne Askew. Most recent CT head 10/2009 showing stable but persistent hematoma without mass effect.  . History of seizure disorder     Likely 2/2 alcohol abuse  . Hypocalcemia   . Hypomagnesemia   . Failure to  thrive in childhood     Unclear etiology  . HTN (hypertension)   . Thrombocytopenia   . Anemia, macrocytic   . Hepatomegaly     On exam  . Alcohol abuse   . Joint pain   . Alcohol abuse   . Arthritis   . Vitamin D deficiency   . Menopause   . Pancreatitis   . Insomnia   . Hyperlipidemia   . Sinusitis   . Pernicious anemia   . Subdural hematoma   . Macrocytic anemia   . Hepatomegaly   . 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 Seizures     1.5 YRS  LAST ONE  . Depression   . Hepatitis     HEPATOMEGALY   . Fx humeral neck 04/17/2011    Transverse fracture- minimally displaced- managed as outpatient   . ABNORMAL PAP SMEAR, LGSIL 07/23/2008    Annotation: HPV positive CIN I Dr. Su Hilt, [facility] Qualifier: Diagnosis of  By: Danae Chen    . Pneumonia 05/20/2012    Past Surgical History  Procedure Laterality Date  . Cesarean section  1983  . Esophagogastroduodenoscopy  07/11/2011    Procedure: ESOPHAGOGASTRODUODENOSCOPY (EGD);  Surgeon: Theda Belfast, MD;  Location: [facility];  Service: Endoscopy;  Laterality: N/A;  . Colonoscopy  07/11/2011    Procedure: COLONOSCOPY;  Surgeon: Theda Belfast, MD;  Location: [facility];  Service: Endoscopy;  Laterality: N/A;  . Eye surgery      LEFT EYE YRS AGO   . Rt colectomy  08/28/2011    Family History:  Family History  Problem Relation Age of Onset  . Cancer Mother     Died from stomach cancer and "flesh eating rash  . Heart failure Father     Died in 30s from an MI  . Alcohol abuse Sister     Twin sister drinks a lot, as did both her parents and brothers  . Stroke Brother     Has 7 brothers, 1 with CVA    Social History:  reports that she quit smoking about 2 years ago. Her smoking use included Cigarettes. She smoked 0.00 packs per day. She has never used smokeless tobacco. She reports that she does not drink alcohol or use illicit drugs.  Additional Social History:  Alcohol / Drug Use Pain  Medications: SEE MAR Prescriptions: SEE MAR Over the Counter: SEE MAR History of alcohol / drug use?: Yes Substance #1 Name of Substance 1: Alcohol  1 - Age of First Use: 57 y/o 1 - Amount (size/oz): 5th of wine  1 - Frequency: daily  1 - Duration: 20 yrs  1 -  Last Use / Amount: last night 11/05/2012 patient drank 2-3 glasses of wine  CIWA: CIWA-Ar BP: 135/91 mmHg Pulse Rate: 87 Nausea and Vomiting: no nausea and no vomiting Tactile Disturbances: none Tremor: no tremor Auditory Disturbances: not present Paroxysmal Sweats: no sweat visible Anxiety: no anxiety, at ease Headache, Fullness in Head: none present Agitation: normal activity Orientation and Clouding of Sensorium: oriented and can do serial additions COWS:    Allergies:  Allergies  Allergen Reactions  . Amitriptyline Hcl Swelling    In the face.  . Doxycycline Hyclate Itching    Feels like something crawling under her skin    Home Medications:  (Not in a hospital admission)  OB/GYN Status:  No LMP recorded. Patient is postmenopausal.  General Assessment Data Location of Assessment: [facility] Living Arrangements: Other (Comment) (lives with female friend and 2 grandchildren) Can pt return to current living arrangement?: No Admission Status: Voluntary Is patient capable of signing voluntary admission?: Yes Transfer from: Acute Hospital Referral Source: Self/Family/Friend     Risk to self Suicidal Ideation: No Suicidal Intent: No Is patient at risk for suicide?: No Suicidal Plan?: No Access to Means: No What has been your use of drugs/alcohol within the last 12 months?:  (n/a) Previous Attempts/Gestures: No How many times?:  (0) Other Self Harm Risks: n/a Triggers for Past Attempts: Other (Comment) (no previous attempts and/or gestures) Intentional Self Injurious Behavior: None Family Suicide History: No Recent stressful life event(s): Legal Issues;Other (Comment) (recenlty obtained a DUI) Persecutory  voices/beliefs?: No Depression: No Substance abuse history and/or treatment for substance abuse?: Yes Suicide prevention information given to non-admitted patients: Not applicable  Risk to Others Homicidal Ideation: No Thoughts of Harm to Others: No Current Homicidal Intent: No Current Homicidal Plan: No Access to Homicidal Means: No Identified Victim:  (n/a) History of harm to others?: No Assessment of Violence: None Noted Violent Behavior Description:  (patient calm and cooperative) Does patient have access to weapons?: No Criminal Charges Pending?: Yes Describe Pending Criminal Charges:  (recenlty obtained a DUI) Does patient have a court date: Yes Court Date:  (Pt had a court date that was missed 10/30/12)  Psychosis Hallucinations: None noted Delusions: None noted  Mental Status Report Appear/Hygiene: Disheveled Eye Contact: Good Motor Activity: Freedom of movement Speech: Logical/coherent Level of Consciousness: Alert Mood: Depressed Affect: Appropriate to circumstance Anxiety Level: None Thought Processes: Coherent Judgement: Unimpaired Orientation: Person;Place;Time;Situation Obsessive Compulsive Thoughts/Behaviors: None  Cognitive Functioning Concentration: Decreased Memory: Recent Intact;Remote Intact IQ: Average Insight: Fair Impulse Control: Fair Appetite: Fair Weight Loss:  (none reported) Weight Gain:  (none reported) Sleep: Decreased (varies; approx.5 hours per night) Total Hours of Sleep:  (5 hrs per night)  ADLScreening [facility]) Patient's cognitive ability adequate to safely complete daily activities?: Yes Patient able to express need for assistance with ADLs?: Yes Independently performs ADLs?: Yes (appropriate for developmental age)  Abuse/Neglect [facility]) Physical Abuse: Denies Verbal Abuse: Denies Sexual Abuse: Denies  Prior Inpatient Therapy Prior Inpatient Therapy: Yes Prior Therapy Dates:  (patient unable to  recall) Prior Therapy Facilty/Provider(s):  ([facility]) Reason for Treatment:  (Detox and Rehabilitation)  Prior Outpatient Therapy Prior Outpatient Therapy: Yes Prior Therapy Dates:  (currently ) Prior Therapy Facilty/Provider(s):  (AA meetings, [facility], & another unk facility ) Reason for Treatment:  (support group, medication management, therapy)  ADL Screening (condition at time of admission) Patient's cognitive ability adequate to safely complete daily activities?: Yes Patient able to express need  for assistance with ADLs?: Yes Independently performs ADLs?: Yes (appropriate for developmental age) Weakness of Legs: None Weakness of Arms/Hands: None  Home Assistive Devices/Equipment Home Assistive Devices/Equipment: None    Abuse/Neglect Assessment (Assessment to be complete while patient is alone) Physical Abuse: Denies Verbal Abuse: Denies Sexual Abuse: Denies Exploitation of patient/patient's resources: Denies Self-Neglect: Denies Values / Beliefs Cultural Requests During Hospitalization: None Spiritual Requests During Hospitalization: None   Advance Directives (For Healthcare) Advance Directive: Patient does not have advance directive Nutrition Screen- [facility] Patient's home diet: Regular  Additional Information 1:1 In Past 12 Months?: Yes CIRT Risk: No Elopement Risk: No Does patient have medical clearance?: No     Disposition:  Disposition Initial Assessment Completed for this Encounter: Yes Disposition of Patient: Inpatient treatment program Type of inpatient treatment program: Adult  On Site Evaluation by:   Reviewed with Physician:     Octaviano Batty 11/06/2012 3:02 PM

## 2012-11-06 NOTE — ED Provider Notes (Signed)
Medical screening examination/treatment/procedure(s) were conducted as a shared visit with non-physician practitioner(s) and myself.  I personally evaluated the patient during the encounter  Doug Sou, MD 11/06/12 1513

## 2012-11-06 NOTE — ED Provider Notes (Signed)
History     CSN: [id number]  Arrival date & time 11/06/12  1106   First MD Initiated Contact with Patient 11/06/12 1107      No chief complaint on file.   (Consider location/radiation/quality/duration/timing/severity/associated sxs/prior treatment) HPI  57 year old female with history of alcohol abuse presents for and requested detox and rehabilitation at at [facility].  Patient was initially seen and evaluated at her primary care office today when her routine lab work shows evidence of hypomagnesia and hypocalcemia likely secondary to chronic alcohol abuse and malnutrition. Patient does report generalized fatigue but no other symptoms. Patient has been medically cleared at her doctor's office and was send to ER for ACT evaluation and further management.  Pt currently without complaints except pain to L shoulder for the past 3 days.  Pain is throbbing, worse with movement and palpation.  Unsure how she injured it, but think she may have accidentally hits the wall while sleeping.  No complaint of shoulder or wrist pain.  Denies fever, headache, cp, sob, abd pain, n/v/d.  Last alcohol use was last night, 2 glasses of wine.  Denies smoking hx of rec drug use.  Denies SI/HI, or hallucination.  Pt is here accompany by family member and also her therapist.      Past Medical History  Diagnosis Date  . Anemia, B12 deficiency   . History of acute pancreatitis   . Right knee pain     No recent imaging on chart  . 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 Dr. Su Hilt, [facility]  . Hypertriglyceridemia   . GERD (gastroesophageal reflux disease)   . Vitamin D deficiency   . Subdural hematoma 02/2008    Likely 2/2 trauma from seizure from EtOH withdrawal, chronic in nature, sees Dr. Robyne Askew. Most recent CT head 10/2009 showing stable but persistent hematoma without mass effect.  . History of seizure disorder     Likely 2/2 alcohol abuse   . Hypocalcemia   . Hypomagnesemia   . Failure to thrive in childhood     Unclear etiology  . HTN (hypertension)   . Thrombocytopenia   . Anemia, macrocytic   . Hepatomegaly     On exam  . Alcohol abuse   . Joint pain   . Alcohol abuse   . Arthritis   . Vitamin D deficiency   . Menopause   . Pancreatitis   . Insomnia   . Hyperlipidemia   . Sinusitis   . Pernicious anemia   . Subdural hematoma   . Macrocytic anemia   . Hepatomegaly   . 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 Seizures     1.5 YRS  LAST ONE  . Depression   . Hepatitis     HEPATOMEGALY   . Fx humeral neck 04/17/2011    Transverse fracture- minimally displaced- managed as outpatient   . ABNORMAL PAP SMEAR, LGSIL 07/23/2008    Annotation: HPV positive CIN I Dr. Su Hilt, [facility] Qualifier: Diagnosis of  By: Danae Chen    . Pneumonia 05/20/2012    Past Surgical History  Procedure Laterality Date  . Cesarean section  1983  . Esophagogastroduodenoscopy  07/11/2011    Procedure: ESOPHAGOGASTRODUODENOSCOPY (EGD);  Surgeon: Theda Belfast, MD;  Location: [facility];  Service: Endoscopy;  Laterality: N/A;  . Colonoscopy  07/11/2011    Procedure: COLONOSCOPY;  Surgeon: Theda Belfast, MD;  Location:  [facility];  Service: Endoscopy;  Laterality: N/A;  . Eye surgery      LEFT EYE YRS AGO   . Rt colectomy  08/28/2011    Family History  Problem Relation Age of Onset  . Cancer Mother     Died from stomach cancer and "flesh eating rash  . Heart failure Father     Died in 17s from an MI  . Alcohol abuse Sister     Twin sister drinks a lot, as did both her parents and brothers  . Stroke Brother     Has 7 brothers, 1 with CVA    History  Substance Use Topics  . Smoking status: Former Smoker    Types: Cigarettes    Quit date: 09/20/2010  . Smokeless tobacco: Never Used  . Alcohol Use: No     Comment: No alocohol x 3 weeks    OB History   Grav Para Term Preterm Abortions TAB SAB  Ect Mult Living                  Review of Systems  All other systems reviewed and are negative.    Allergies  Amitriptyline hcl and Doxycycline hyclate  Home Medications   Current Outpatient Rx  Name  Route  Sig  Dispense  Refill  . amLODipine (NORVASC) 10 MG tablet   Oral   Take 1 tablet (10 mg total) by mouth daily.   30 tablet   3   . amylase-lipase-protease (PANGESTYME EC) 20-4.5-25 MU per capsule   Oral   Take 1 capsule by mouth 3 (three) times daily with meals.   90 capsule   4   . calcium carbonate (TUMS) 500 MG chewable tablet   Oral   Chew 6 tablets (1,200 mg of elemental calcium total) by mouth 3 (three) times daily.   90 tablet   3   . cetirizine (ZYRTEC) 10 MG tablet   Oral   Take 1 tablet (10 mg total) by mouth daily.   30 tablet   1   . FLUoxetine (PROZAC) 10 MG capsule   Oral   Take 1 capsule (10 mg total) by mouth daily.   30 capsule   3   . folic acid (FOLVITE) 1 MG tablet   Oral   Take 1 tablet (1 mg total) by mouth daily.   30 tablet   1   . gabapentin (NEURONTIN) 300 MG capsule   Oral   Take 2 capsules (600 mg total) by mouth 3 (three) times daily.   90 capsule   2   . hydrochlorothiazide (HYDRODIURIL) 25 MG tablet   Oral   Take 1 tablet (25 mg total) by mouth daily.   30 tablet   3   . KLOR-CON M20 20 MEQ tablet               . levETIRAcetam (KEPPRA) 500 MG tablet   Oral   Take 500 mg by mouth 2 (two) times daily.         . magnesium oxide (MAG-OX) 400 MG tablet   Oral   Take 1 tablet (400 mg total) by mouth 3 (three) times daily.   90 tablet   3   . Multiple Vitamin (MULITIVITAMIN WITH MINERALS) TABS   Oral   Take 1 tablet by mouth daily.   30 tablet   11   . omeprazole (PRILOSEC) 40 MG capsule   Oral   Take 1 capsule (40 mg total) by  mouth daily.   30 capsule   3   . ranitidine (ZANTAC) 150 MG tablet               . thiamine (VITAMIN B-1) 100 MG tablet   Oral   Take 1 tablet (100 mg total)  by mouth daily.   90 tablet   3   . vitamin B-12 (CYANOCOBALAMIN) 250 MCG tablet   Oral   Take 1 tablet (250 mcg total) by mouth daily.         . Vitamin D, Ergocalciferol, (DRISDOL) 50000 UNITS CAPS   Oral   Take 1 capsule (50,000 Units total) by mouth every 7 (seven) days.   10 capsule   3   . VOLTAREN 1 % GEL      APPLY TO AFFECTED AREA 4 TIMES A DAY   100 g   2     There were no vitals taken for this visit.  Physical Exam  Nursing note and vitals reviewed. Constitutional:  Thin-appearing African American female in no acute distress  HENT:  Head: Normocephalic and atraumatic.  Mouth/Throat: Oropharynx is clear and moist.  Eyes: Conjunctivae are normal.  Neck: Neck supple.  Cardiovascular: Normal rate and regular rhythm.   Pulmonary/Chest: Effort normal and breath sounds normal.  Abdominal: Soft. There is no tenderness.  Musculoskeletal: She exhibits tenderness (Left elbow: Tenderness and mild swelling noted to the olecranon fossa without obvious deformity, crepitus, or bruise noted.  FROM. sensation intact.).       Left shoulder: Normal.       Left wrist: Normal.  Neurological: GCS eye subscore is 4. GCS verbal subscore is 5. GCS motor subscore is 6.  Psychiatric: She has a normal mood and affect. Her speech is normal and behavior is normal. Judgment and thought content normal. Cognition and memory are normal.    ED Course  Procedures (including critical care time)  11:34 AM Pt is here for alcohol detox from chronic alcohol use.  Medical clearance labs has already been performed at her doctor's office today, and pt has always been medically cleared by her PCP.  Therefore will consult ACT for further management.  Does report pain to L elbow with possible injury.  Will xray elbow.  Pt has hx of hypomagnesia/hypokalemia/hypocalcemia/low vit D 2/2 to malnutrition from alcohol abuse.  Her PCP is aware and currently treating her with supplementation.    1:38 PM Xray  of L elbow without obvious fx but does highlight soft tissue swelling suggestive either bony contusion or occult fx.  I provide ice, sling for comfort and will give ortho referral once discharge to f/u as needed.  I have consulted with ACT who will evaluate pt.    2:06 PM Sling was removed as it is against policy in Psych ED due to potential risk.  Since sling is only for comfort, we removed it.  Pt is medically cleared.  Care discussed with attending. Psych hold and med rec filled.  Pt placed on CIWA protocol.  Has elevated liver enzyme, likely 2/2 alcohol abuse.  No active abdominal pain at this time.  Labs Reviewed  COMPREHENSIVE METABOLIC PANEL - Abnormal; Notable for the following:    CO2 16 (*)    Glucose, Bld 110 (*)    Calcium 7.6 (*)    Total Protein 8.9 (*)    Albumin 2.0 (*)    AST 90 (*)    Alkaline Phosphatase 384 (*)    Total Bilirubin 0.2 (*)  GFR calc non Af Amer 61 (*)    GFR calc Af Amer 71 (*)    All other components within normal limits  ETHANOL - Abnormal; Notable for the following:    Alcohol, Ethyl (B) 247 (*)    All other components within normal limits  CBC WITH DIFFERENTIAL - Abnormal; Notable for the following:    RBC 2.90 (*)    Hemoglobin 9.8 (*)    HCT 29.9 (*)    MCV 103.1 (*)    RDW 16.1 (*)    Neutrophils Relative % 34 (*)    Monocytes Relative 21 (*)    Monocytes Absolute 1.6 (*)    All other components within normal limits  URINE RAPID DRUG SCREEN (HOSP PERFORMED)   Dg Elbow Complete Left  11/06/2012   *RADIOLOGY REPORT*  Clinical Data: Posterior left elbow pain.  LEFT ELBOW - COMPLETE 3+ VIEW  Comparison: None.  Findings: There is an elbow joint effusion.  No fracture is identified.  The elbow is located.  Bones appear osteopenic.  No notable degenerative disease identified.  IMPRESSION: Elbow joint effusion could be due to a bone contusion or occult fracture.  Cause for the effusion is not identified.   Original Report Authenticated By: Holley Dexter, M.D.     1. Alcohol abuse   2. Left elbow contusion, initial encounter   3. Malnutrition       MDM  BP 138/97  Pulse 87  Temp(Src) 97.7 F (36.5 C) (Oral)  Resp 12  Wt 83 lb (37.649 kg)  BMI 15.69 kg/m2  SpO2 94%  I have reviewed nursing notes and vital signs. I personally reviewed the imaging tests through PACS system  I reviewed available ER/hospitalization records thought the EMR         Fayrene Helper, [place] 11/06/12 1512

## 2012-11-06 NOTE — Progress Notes (Signed)
[name], female   DOB: [birth year], 56 y.o.   MRN: [id number]  Subjective:   [name] female   DOB: May 30, 1956 57 y.o.   MRN: [id number]  HPI: [name] is a 57 y.o. female presenting for follow up of hypoMg and hypoCa.  Last week on routine lab tests, results returned showing Mg 0.6 and Ca of 6.0. Patient was called, and said that she felt fatigued but otherwise fine and did not want to go to the ED, preferring to come to clinic. She was seen in clinic on 10/30/12 and noted fatigue but no other symptoms. She was given 4g IV Mg w thiamine and folate, and instructed to continue TID Mg at home, 6 tablets of tums TID, and stop lasix. Of note, she has chronic hypomagnesemia and chronic hypocalcemia in the setting of severe alcohol abuse/malnutrition as well as severe Vit D deficiency. She was supposed to be taking BID magnesium supplements which were discontinued after hospitalization in March and only restarted earlier in May. She does have chronic kidney disease.  Today she reports feeling fatigued but otherwise no complaints. Has not yet filled magnesium since last clinic appt. Continues tums supplementation at home.  She denies any chest pain, dyspnea, dizziness, palpitations, abdominal pain, muscle spasms, seizures. She says she is ready to enter rehab for EtOH abuse and is accompanied by a Child psychotherapist and sister. Plan to proceed to behavioral health from here for detox prior to completing rehab at [facility].    Past Medical History  Diagnosis Date  . Anemia, B12 deficiency   . History of acute pancreatitis   . Right knee pain     No recent imaging on chart  . 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 Dr. Su Hilt, [facility]  . Hypertriglyceridemia   . GERD (gastroesophageal reflux disease)   . Vitamin D deficiency   . Subdural hematoma 02/2008    Likely 2/2 trauma from seizure from  EtOH withdrawal, chronic in nature, sees Dr. Robyne Askew. Most recent CT head 10/2009 showing stable but persistent hematoma without mass effect.  . History of seizure disorder     Likely 2/2 alcohol abuse  . Hypocalcemia   . Hypomagnesemia   . Failure to thrive in childhood     Unclear etiology  . HTN (hypertension)   . Thrombocytopenia   . Anemia, macrocytic   . Hepatomegaly     On exam  . Alcohol abuse   . Joint pain   . Alcohol abuse   . Arthritis   . Vitamin D deficiency   . Menopause   . Pancreatitis   . Insomnia   . Hyperlipidemia   . Sinusitis   . Pernicious anemia   . Subdural hematoma   . Macrocytic anemia   . Hepatomegaly   . 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 Seizures     1.5 YRS  LAST ONE  . Depression   . Hepatitis     HEPATOMEGALY   . Fx humeral neck 04/17/2011    Transverse fracture- minimally displaced- managed as outpatient   . ABNORMAL PAP SMEAR, LGSIL 07/23/2008    Annotation: HPV positive CIN I Dr. Su Hilt, [facility] Qualifier: Diagnosis of  By: Danae Chen    . Pneumonia 05/20/2012   Current Outpatient Prescriptions  Medication Sig Dispense Refill  . amLODipine (NORVASC) 10 MG tablet  Take 1 tablet (10 mg total) by mouth daily.  30 tablet  3  . amylase-lipase-protease (PANGESTYME EC) 20-4.5-25 MU per capsule Take 1 capsule by mouth 3 (three) times daily with meals.  90 capsule  4  . calcium carbonate (TUMS) 500 MG chewable tablet Chew 6 tablets (1,200 mg of elemental calcium total) by mouth 3 (three) times daily.  90 tablet  3  . cetirizine (ZYRTEC) 10 MG tablet Take 1 tablet (10 mg total) by mouth daily.  30 tablet  1  . FLUoxetine (PROZAC) 10 MG capsule Take 1 capsule (10 mg total) by mouth daily.  30 capsule  3  . folic acid (FOLVITE) 1 MG tablet Take 1 tablet (1 mg total) by mouth daily.  30 tablet  1  . gabapentin (NEURONTIN) 300 MG capsule Take 2 capsules (600 mg total) by mouth 3 (three) times daily.  90 capsule  2   . hydrochlorothiazide (HYDRODIURIL) 25 MG tablet Take 1 tablet (25 mg total) by mouth daily.  30 tablet  3  . KLOR-CON M20 20 MEQ tablet       . levETIRAcetam (KEPPRA) 500 MG tablet Take 500 mg by mouth 2 (two) times daily.      . magnesium oxide (MAG-OX) 400 MG tablet Take 1 tablet (400 mg total) by mouth 3 (three) times daily.  90 tablet  3  . Multiple Vitamin (MULITIVITAMIN WITH MINERALS) TABS Take 1 tablet by mouth daily.  30 tablet  11  . omeprazole (PRILOSEC) 40 MG capsule Take 1 capsule (40 mg total) by mouth daily.  30 capsule  3  . thiamine (VITAMIN B-1) 100 MG tablet Take 1 tablet (100 mg total) by mouth daily.  90 tablet  3  . vitamin B-12 (CYANOCOBALAMIN) 250 MCG tablet Take 1 tablet (250 mcg total) by mouth daily.      . Vitamin D, Ergocalciferol, (DRISDOL) 50000 UNITS CAPS Take 1 capsule (50,000 Units total) by mouth every 7 (seven) days.  10 capsule  3  . VOLTAREN 1 % GEL APPLY TO AFFECTED AREA 4 TIMES A DAY  100 g  2   No current facility-administered medications for this visit.   Family History  Problem Relation Age of Onset  . Cancer Mother     Died from stomach cancer and "flesh eating rash  . Heart failure Father     Died in 36s from an MI  . Alcohol abuse Sister     Twin sister drinks a lot, as did both her parents and brothers  . Stroke Brother     Has 7 brothers, 1 with CVA   History   Social History  . Marital Status: Divorced    Spouse Name: N/A    Number of Children: N/A  . Years of Education: N/A   Social History Main Topics  . Smoking status: Former Smoker    Types: Cigarettes    Quit date: 09/20/2010  . Smokeless tobacco: Never Used  . Alcohol Use: No     Comment: No alocohol x 3 weeks  . Drug Use: No     Comment: HX  USE   . Sexually Active: Not Currently   Other Topics Concern  . Not on file   Social History Narrative   Lives with her significant other and 2 grandchildren. 1 child   Has 7 brothers and 4 sisters, 1 twin sister.    Unemployed, worked in [employer].    Abuses alcohol-drinks 1 glass of wine daily  No drug use. Former cigarette use quit 1.5 years ago.     11 th grade education            Review of Systems: 10 pt ROS performed, pertinent positives and negatives noted in HPI Objective:  Physical Exam: Filed Vitals:   11/06/12 0956  BP: 146/98  Pulse: 94  Temp: 97.1 F (36.2 C)  TempSrc: Oral  Height: 5\' 1"  (1.549 m)  Weight: 83 lb 12.8 oz (38.011 kg)  SpO2: 100%   Constitutional: Vital signs reviewed. Cachectic in NAD.  Head: Normocephalic and atraumatic  Mouth: poor/absent dentition. no erythema or exudates, MMM  Eyes: PERRL, EOMI, conjunctivae normal, No scleral icterus.  Neck: Supple, Trachea midline normal ROM; no masses  Cardiovascular: RRR, S1 normal, S2 normal, no MRG, pulses symmetric and intact bilaterally  Pulmonary/Chest: CTAB, no wheezes, rales, or rhonchi  Abdominal: Soft. Non-tender, non-distended, bowel sounds are normal, no masses, organomegaly, or guarding present.  Musculoskeletal: No joint deformities, erythema, or stiffness, ROM full and no nontender Hematology: no cervical adenopathy or visible bruising  Neurological: A&O x3, Strength is normal and symmetric bilaterally, cranial nerve II-XII are grossly intact, no focal motor deficit, sensory intact to light touch bilaterally. Reflexes are 1+ and symmetric brachioradialis, biceps, patellar, achilles.  Skin: Thinning hair of scalp.  Psychiatric: Normal mood and affect.  Assessment & Plan:   Please see problem-based charting for assessment and plan.

## 2012-11-06 NOTE — Assessment & Plan Note (Addendum)
Wants rehab today, is accompanied by CSW. Will need to go to [facility] for intake. Medically clear for rehab from our perspective. Her hypomagnesemia and hypocalcemia will slowly improve with compliance with supplementation, improvement of nutritional status, and cessation of alcohol.  Plan to go from here to behavioral health, will be driven by CSW.

## 2012-11-06 NOTE — Patient Instructions (Addendum)
Please go to [facility] for detox. You are medically clear for detox. Keep taking your calcium and magnesium.

## 2012-11-07 DIAGNOSIS — F1020 Alcohol dependence, uncomplicated: Secondary | ICD-10-CM | POA: Diagnosis not present

## 2012-11-07 MED ORDER — POTASSIUM CHLORIDE CRYS ER 10 MEQ PO TBCR
20.0000 meq | EXTENDED_RELEASE_TABLET | Freq: Two times a day (BID) | ORAL | Status: DC
  Administered 2012-11-07: 20 meq via ORAL
  Filled 2012-11-07 (×5): qty 2

## 2012-11-07 MED ORDER — FERROUS SULFATE 325 (65 FE) MG PO TABS
325.0000 mg | ORAL_TABLET | Freq: Two times a day (BID) | ORAL | Status: DC
  Administered 2012-11-07 – 2012-11-11 (×8): 325 mg via ORAL
  Filled 2012-11-07 (×5): qty 1
  Filled 2012-11-07: qty 8
  Filled 2012-11-07 (×3): qty 1
  Filled 2012-11-07: qty 8
  Filled 2012-11-07 (×2): qty 1

## 2012-11-07 MED ORDER — FOLIC ACID 1 MG PO TABS
1.0000 mg | ORAL_TABLET | Freq: Every day | ORAL | Status: DC
  Administered 2012-11-07 – 2012-11-11 (×5): 1 mg via ORAL
  Filled 2012-11-07: qty 4
  Filled 2012-11-07 (×6): qty 1

## 2012-11-07 NOTE — [facility] Suicide Risk Assessment (Signed)
[facility] INPATIENT: Family/Significant Other Suicide Prevention Education  Suicide Prevention Education:  Education Completed; No one has been identified by the patient as the family member/significant other with whom the patient will be residing, and identified as the person(s) who will aid the patient in the event of a mental health crisis (suicidal ideations/suicide attempt). With written consent from the patient, the family member/significant other has been provided the following suicide prevention education, prior to the and/or following the discharge of the patient.  The suicide prevention education provided includes the following:  Suicide risk factors  Suicide prevention and interventions  National Suicide Hotline telephone number  [facility] assessment telephone number  [facility] Emergency Assistance 911  [facility] and/or Residential Mobile Crisis Unit telephone number Request made of family/significant other to:  Remove weapons (e.g., guns, rifles, knives), all items previously/currently identified as safety concern.  Remove drugs/medications (over-the-counter, prescriptions, illicit drugs), all items previously/currently identified as a safety concern. The family member/significant other verbalizes understanding of the suicide prevention education information provided. The family member/significant other agrees to remove the items of safety concern listed above. Pt did not c/o SI at admission, nor have they endorsed SI during their stay here. SPE not required.  Reyes Ivan, LCSWA 11/07/2012  9:21 AM

## 2012-11-07 NOTE — Progress Notes (Signed)
Adult Psychoeducational Group Note  Date:  11/07/2012 Time:  3:08 PM  Group Topic/Focus:  Self Esteem Action Plan:   The focus of this group is to help patients create a plan to continue to build self-esteem after discharge.  Participation Level:  Minimal  Participation Quality:  Appropriate  Affect:  Appropriate  Cognitive:  Appropriate  Insight: Appropriate  Engagement in Group:  Developing/Improving  Modes of Intervention:  Activity and Discussion  Additional Comments:  Patient was quiet, but appropriate during group.   Lyndee Hensen 11/07/2012, 3:08 PM

## 2012-11-07 NOTE — H&P (Signed)
Psychiatric Admission Assessment Adult  Patient Identification:  [name] [name]  Date of Evaluation:  11/07/2012  Chief Complaint:  ETOH DEPENDENCE  History of Present Illness: This is a 57 year old African-American female. Admitted from the [facility]  With complaints of alcohol intoxication requesting detox. Patient reports, "My therapist took me to the hospital yesterday for detox. I drank too much alcohol. I have been drinking for 20 years. I was drinking up to 7 glasses of wine everyday for 4 years. I was at the [facility] about 2 months ago for treatment. I got very sick and I was taken to the hospital. But, I was not allowed to return back to the treatment center. I got pancreatitis from drinking too much. It has been 3 years that I was diagnosed with it. I also has a pending DUI court case. I was suppose to go to court last month, but it was postponed. I don't want to drink myself to death. I want to live. I got grand-babies.  Elements:  Location:  [facility] adult. Quality:  "I crave alcohol, and I drink a lot of it". Severity:  "I drink 7 glasses of wine daily". Timing:  "I have drinking like this for 4 years". Duration:  "I have been drinking for 20 years". Context:  "I got pancreatitis, all kinds of problems from drinking".  Associated Signs/Synptoms:  Depression Symptoms:  feelings of worthlessness/guilt, anxiety,  (Hypo) Manic Symptoms:  Impulsivity,  Anxiety Symptoms:  Excessive Worry,  Psychotic Symptoms:  Hallucinations: Denies  PTSD Symptoms: Had a traumatic exposure:  Denies  Psychiatric Specialty Exam: Physical Exam  Constitutional: She is oriented to person, place, and time. She appears well-developed.  Cachexic  HENT:  Head: Normocephalic.  Eyes: Pupils are equal, round, and reactive to light.  Neck: Normal range of motion.  Cardiovascular: Normal rate.   Respiratory: Effort normal.  GI: Soft.  Musculoskeletal: Normal range of motion.   Neurological: She is alert and oriented to person, place, and time.  Skin: Skin is warm and dry.  Psychiatric: Her speech is normal. Thought content normal. Her mood appears anxious. Her affect is not angry, not blunt, not labile and not inappropriate. Cognition and memory are normal. She expresses impulsivity. She does not exhibit a depressed mood.    Review of Systems  Constitutional: Negative.   HENT: Negative.   Eyes: Negative.   Respiratory: Negative.   Cardiovascular: Negative.   Gastrointestinal: Negative.   Genitourinary: Negative.   Musculoskeletal: Negative.   Skin: Negative.   Neurological: Negative.   Endo/Heme/Allergies: Negative.   Psychiatric/Behavioral: Positive for substance abuse (Alcoholism). Negative for depression, suicidal ideas, hallucinations and memory loss. The patient is nervous/anxious. The patient does not have insomnia.     Blood pressure 108/72, pulse 118, temperature 99.8 F (37.7 C), temperature source Oral, resp. rate 18, height 5\' 1"  (1.549 m), weight 37.649 kg (83 lb).Body mass index is 15.69 kg/(m^2).  General Appearance: Disheveled and Cachexic  Eye Contact::  Good  Speech:  Clear and Coherent  Volume:  Normal  Mood:  Anxious  Affect:  Congruent  Thought Process:  Coherent and Goal Directed  Orientation:  Full (Time, Place, and Person)  Thought Content:  Rumination  Suicidal Thoughts:  No  Homicidal Thoughts:  No  Memory:  Immediate;   Good Recent;   Good Remote;   Good  Judgement:  Fair  Insight:  Fair  Psychomotor Activity:  Anxious  Concentration:  Good  Recall:  Good  Akathisia:  No  Handed:  Right  AIMS (if indicated):     Assets:  Desire for Improvement  Sleep:  Number of Hours: 6.75    Past Psychiatric History: Diagnosis: Alcohol dependence,  Hospitalizations: [facility]  Outpatient Care: With Ms. Lendon Collar.  Substance Abuse Care: [facility], 2 months ago.  Self-Mutilation: Denies  Suicidal Attempts: Denies attempts and or  thoughts  Violent Behaviors: Denies   Past Medical History:   Past Medical History  Diagnosis Date  . Anemia, B12 deficiency   . History of acute pancreatitis   . Right knee pain     No recent imaging on chart  . 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 Dr. Su Hilt, [facility]  . Hypertriglyceridemia   . GERD (gastroesophageal reflux disease)   . Vitamin D deficiency   . Subdural hematoma 02/2008    Likely 2/2 trauma from seizure from EtOH withdrawal, chronic in nature, sees Dr. Robyne Askew. Most recent CT head 10/2009 showing stable but persistent hematoma without mass effect.  . History of seizure disorder     Likely 2/2 alcohol abuse  . Hypocalcemia   . Hypomagnesemia   . Failure to thrive in childhood     Unclear etiology  . HTN (hypertension)   . Thrombocytopenia   . Anemia, macrocytic   . Hepatomegaly     On exam  . Alcohol abuse   . Joint pain   . Alcohol abuse   . Arthritis   . Vitamin D deficiency   . Menopause   . Pancreatitis   . Insomnia   . Hyperlipidemia   . Sinusitis   . Pernicious anemia   . Subdural hematoma   . Macrocytic anemia   . Hepatomegaly   . 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 Seizures     1.5 YRS  LAST ONE  . Depression   . Hepatitis     HEPATOMEGALY   . Fx humeral neck 04/17/2011    Transverse fracture- minimally displaced- managed as outpatient   . ABNORMAL PAP SMEAR, LGSIL 07/23/2008    Annotation: HPV positive CIN I Dr. Su Hilt, [facility] Qualifier: Diagnosis of  By: Danae Chen    . Pneumonia 05/20/2012   Seizure History:  1.5 years ago Cardiac History:  Hyperlipidemia, Pernicious anemia  Allergies:   Allergies  Allergen Reactions  . Amitriptyline Hcl Swelling    In the face.  . Doxycycline Hyclate Itching    Feels like something crawling under her skin   PTA Medications: Prescriptions prior to admission  Medication Sig Dispense  Refill  . amLODipine (NORVASC) 10 MG tablet Take 1 tablet (10 mg total) by mouth daily.  30 tablet  3  . amylase-lipase-protease (PANGESTYME EC) 20-4.5-25 MU per capsule Take 1 capsule by mouth 3 (three) times daily with meals.  90 capsule  4  . calcium carbonate (TUMS) 500 MG chewable tablet Chew 6 tablets (1,200 mg of elemental calcium total) by mouth 3 (three) times daily.  90 tablet  3  . diclofenac sodium (VOLTAREN) 1 % GEL       . diclofenac sodium (VOLTAREN) 1 % GEL Apply 4 g topically 4 (four) times daily as needed (FOR PAIN).      Marland Kitchen FLUoxetine (PROZAC) 10 MG capsule Take 1 capsule (10 mg total) by mouth daily.  30 capsule  3  . folic acid (FOLVITE) 1 MG tablet Take 1  tablet (1 mg total) by mouth daily.  30 tablet  1  . gabapentin (NEURONTIN) 300 MG capsule Take 600 mg by mouth 3 (three) times daily as needed (pain).      . hydrochlorothiazide (HYDRODIURIL) 25 MG tablet Take 1 tablet (25 mg total) by mouth daily.  30 tablet  3  . KLOR-CON M20 20 MEQ tablet Take 20 mEq by mouth 2 (two) times daily.       Marland Kitchen levETIRAcetam (KEPPRA) 500 MG tablet Take 500 mg by mouth 2 (two) times daily.      . magnesium oxide (MAG-OX) 400 MG tablet Take 1 tablet (400 mg total) by mouth 3 (three) times daily.  90 tablet  3  . Multiple Vitamin (MULITIVITAMIN WITH MINERALS) TABS Take 1 tablet by mouth daily.  30 tablet  11  . omeprazole (PRILOSEC) 40 MG capsule Take 1 capsule (40 mg total) by mouth daily.  30 capsule  3  . thiamine (VITAMIN B-1) 100 MG tablet Take 1 tablet (100 mg total) by mouth daily.  90 tablet  3  . vitamin B-12 (CYANOCOBALAMIN) 250 MCG tablet Take 1 tablet (250 mcg total) by mouth daily.      . Vitamin D, Ergocalciferol, (DRISDOL) 50000 UNITS CAPS Take 50,000 Units by mouth every 7 (seven) days. thursdays        Previous Psychotropic Medications:  Medication/Dose  See medication lists               Substance Abuse History in the last 12 months:  yes  Consequences of Substance  Abuse: Medical Consequences:  Liver damage, Possible death by overdose Legal Consequences:  Arrests, jail time, Loss of driving privilege. Family Consequences:  Family discord, divorce and or separation.  Social History:  reports that she quit smoking about 2 years ago. Her smoking use included Cigarettes. She smoked 0.00 packs per day. She has never used smokeless tobacco. She reports that she drinks about 6.0 ounces of alcohol per week. She reports that she does not use illicit drugs. Additional Social History:  Current Place of Residence: [place], [place]  Place of Birth: [place], county   Family Members: "My daughter and live in girlfriend"  Marital Status:  Single  Children: 1  Sons: 0  Daughters: 0  Relationships: Single  Education:  No high school diploma  Educational Problems/Performance: Did not complete high school  Religious Beliefs/Practices: NA  History of Abuse (Emotional/Phsycial/Sexual): Denies  Occupational Experiences: Disabled  Military History:  None.  Legal History: DUI charges, pending court date  Hobbies/Interests: NA  Family History:   Family History  Problem Relation Age of Onset  . Cancer Mother     Died from stomach cancer and "flesh eating rash  . Heart failure Father     Died in 68s from an MI  . Alcohol abuse Sister     Twin sister drinks a lot, as did both her parents and brothers  . Stroke Brother     Has 7 brothers, 1 with CVA    Results for orders placed during the hospital encounter of 11/06/12 (from the past 72 hour(s))  URINE RAPID DRUG SCREEN (HOSP PERFORMED)     Status: None   Collection Time    11/06/12 12:13 PM      Result Value Range   Opiates NONE DETECTED  NONE DETECTED   Cocaine NONE DETECTED  NONE DETECTED   Benzodiazepines NONE DETECTED  NONE DETECTED   Amphetamines NONE DETECTED  NONE DETECTED   Tetrahydrocannabinol  NONE DETECTED  NONE DETECTED   Barbiturates NONE DETECTED  NONE DETECTED   Comment:             DRUG SCREEN FOR MEDICAL PURPOSES     ONLY.  IF CONFIRMATION IS NEEDED     FOR ANY PURPOSE, NOTIFY LAB     WITHIN 5 DAYS.                LOWEST DETECTABLE LIMITS     FOR URINE DRUG SCREEN     Drug Class       Cutoff (ng/mL)     Amphetamine      1000     Barbiturate      200     Benzodiazepine   200     Tricyclics       300     Opiates          300     Cocaine          300     THC              50  COMPREHENSIVE METABOLIC PANEL     Status: Abnormal   Collection Time    11/06/12  1:42 PM      Result Value Range   Sodium 135  135 - 145 mEq/L   Potassium 4.2  3.5 - 5.1 mEq/L   Chloride 105  96 - 112 mEq/L   CO2 16 (*) 19 - 32 mEq/L   Glucose, Bld 110 (*) 70 - 99 mg/dL   BUN 14  6 - 23 mg/dL   Creatinine, Ser 7.82  0.50 - 1.10 mg/dL   Calcium 7.6 (*) 8.4 - 10.5 mg/dL   Total Protein 8.9 (*) 6.0 - 8.3 g/dL   Albumin 2.0 (*) 3.5 - 5.2 g/dL   AST 90 (*) 0 - 37 U/L   ALT 18  0 - 35 U/L   Alkaline Phosphatase 384 (*) 39 - 117 U/L   Total Bilirubin 0.2 (*) 0.3 - 1.2 mg/dL   GFR calc non Af Amer 61 (*) >90 mL/min   GFR calc Af Amer 71 (*) >90 mL/min   Comment:            The eGFR has been calculated     using the CKD EPI equation.     This calculation has not been     validated in all clinical     situations.     eGFR's persistently     <90 mL/min signify     possible Chronic Kidney Disease.  ETHANOL     Status: Abnormal   Collection Time    11/06/12  1:42 PM      Result Value Range   Alcohol, Ethyl (B) 247 (*) 0 - 11 mg/dL   Comment:            LOWEST DETECTABLE LIMIT FOR     SERUM ALCOHOL IS 11 mg/dL     FOR MEDICAL PURPOSES ONLY  CBC WITH DIFFERENTIAL     Status: Abnormal   Collection Time    11/06/12  1:42 PM      Result Value Range   WBC 7.5  4.0 - 10.5 K/uL   RBC 2.90 (*) 3.87 - 5.11 MIL/uL   Hemoglobin 9.8 (*) 12.0 - 15.0 g/dL   HCT 95.6 (*) 21.3 - 08.6 %   MCV 103.1 (*) 78.0 - 100.0 fL   MCH 33.8  26.0 - 34.0 pg  MCHC 32.8  30.0 - 36.0 g/dL   RDW 21.3 (*)  08.6 - 15.5 %   Platelets 208  150 - 400 K/uL   Neutrophils Relative % 34 (*) 43 - 77 %   Neutro Abs 2.6  1.7 - 7.7 K/uL   Lymphocytes Relative 43  12 - 46 %   Lymphs Abs 3.2  0.7 - 4.0 K/uL   Monocytes Relative 21 (*) 3 - 12 %   Monocytes Absolute 1.6 (*) 0.1 - 1.0 K/uL   Eosinophils Relative 1  0 - 5 %   Eosinophils Absolute 0.1  0.0 - 0.7 K/uL   Basophils Relative 1  0 - 1 %   Basophils Absolute 0.0  0.0 - 0.1 K/uL   Psychological Evaluations:  Assessment:   AXIS I:  Alcohol dependence AXIS II:  Deferred AXIS III:   Past Medical History  Diagnosis Date  . Anemia, B12 deficiency   . History of acute pancreatitis   . Right knee pain     No recent imaging on chart  . 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 Dr. Su Hilt, [facility]  . Hypertriglyceridemia   . GERD (gastroesophageal reflux disease)   . Vitamin D deficiency   . Subdural hematoma 02/2008    Likely 2/2 trauma from seizure from EtOH withdrawal, chronic in nature, sees Dr. Robyne Askew. Most recent CT head 10/2009 showing stable but persistent hematoma without mass effect.  . History of seizure disorder     Likely 2/2 alcohol abuse  . Hypocalcemia   . Hypomagnesemia   . Failure to thrive in childhood     Unclear etiology  . HTN (hypertension)   . Thrombocytopenia   . Anemia, macrocytic   . Hepatomegaly     On exam  . Alcohol abuse   . Joint pain   . Alcohol abuse   . Arthritis   . Vitamin D deficiency   . Menopause   . Pancreatitis   . Insomnia   . Hyperlipidemia   . Sinusitis   . Pernicious anemia   . Subdural hematoma   . Macrocytic anemia   . Hepatomegaly   . 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 Seizures     1.5 YRS  LAST ONE  . Depression   . Hepatitis     HEPATOMEGALY   . Fx humeral neck 04/17/2011    Transverse fracture- minimally displaced- managed as outpatient   . ABNORMAL PAP SMEAR, LGSIL 07/23/2008    Annotation: HPV  positive CIN I Dr. Su Hilt, [facility] Qualifier: Diagnosis of  By: Danae Chen    . Pneumonia 05/20/2012   AXIS IV:  other psychosocial or environmental problems and Alcoholism AXIS V:  1-10 persistent dangerousness to self and others present  Treatment Plan/Recommendations: 1. Admit for crisis management and stabilization, estimated length of stay 3-5 days.  2. Medication management to reduce current symptoms to base line and improve the patient's overall level of functioning  3. Treat health problems as indicated.  4. Develop treatment plan to decrease risk of relapse upon discharge and the need for readmission.  5. Psycho-social education regarding relapse prevention and self care.  6. Health care follow up as needed for medical problems.  7. Review, reconcile, and reinstate any pertinent home medications for other health issues where appropriate. 8. Call for consults with hospitalist for any additional specialty patient care services as  needed.  Treatment Plan Summary: Daily contact with patient to assess and evaluate symptoms and progress in treatment Medication management  Current Medications:  Current Facility-Administered Medications  Medication Dose Route Frequency Provider Last Rate Last Dose  . acetaminophen (TYLENOL) tablet 650 mg  650 mg Oral Q6H PRN Shuvon Rankin, NP      . alum & mag hydroxide-simeth (MAALOX/MYLANTA) 200-200-20 MG/5ML suspension 30 mL  30 mL Oral Q4H PRN Shuvon Rankin, NP      . amLODipine (NORVASC) tablet 10 mg  10 mg Oral Daily Shuvon Rankin, NP   10 mg at 11/07/12 1610  . calcium carbonate (TUMS - dosed in mg elemental calcium) chewable tablet 1,200 mg of elemental calcium  6 tablet Oral TID Shuvon Rankin, NP   1,200 mg of elemental calcium at 11/07/12 0839  . chlordiazePOXIDE (LIBRIUM) capsule 25 mg  25 mg Oral Q6H PRN Shuvon Rankin, NP      . chlordiazePOXIDE (LIBRIUM) capsule 25 mg  25 mg Oral Once Shuvon Rankin, NP      .  chlordiazePOXIDE (LIBRIUM) capsule 25 mg  25 mg Oral QID Rachael Fee, MD   25 mg at 11/07/12 9604   Followed by  . [START ON 11/08/2012] chlordiazePOXIDE (LIBRIUM) capsule 25 mg  25 mg Oral TID Rachael Fee, MD       Followed by  . [START ON 11/09/2012] chlordiazePOXIDE (LIBRIUM) capsule 25 mg  25 mg Oral BH-qamhs Rachael Fee, MD       Followed by  . [START ON 11/10/2012] chlordiazePOXIDE (LIBRIUM) capsule 25 mg  25 mg Oral Daily Rachael Fee, MD      . ferrous sulfate tablet 325 mg  325 mg Oral BID WC Sanjuana Kava, NP      . FLUoxetine (PROZAC) capsule 10 mg  10 mg Oral Daily Shuvon Rankin, NP   10 mg at 11/07/12 5409  . folic acid (FOLVITE) tablet 1 mg  1 mg Oral Daily Sanjuana Kava, NP      . gabapentin (NEURONTIN) capsule 600 mg  600 mg Oral TID Shuvon Rankin, NP   600 mg at 11/07/12 0833  . hydrochlorothiazide (HYDRODIURIL) tablet 25 mg  25 mg Oral Daily Shuvon Rankin, NP   25 mg at 11/07/12 8119  . hydrOXYzine (ATARAX/VISTARIL) tablet 25 mg  25 mg Oral Q6H PRN Shuvon Rankin, NP      . levETIRAcetam (KEPPRA) tablet 500 mg  500 mg Oral BID Shuvon Rankin, NP   500 mg at 11/07/12 1478  . loperamide (IMODIUM) capsule 2-4 mg  2-4 mg Oral PRN Shuvon Rankin, NP      . magnesium hydroxide (MILK OF MAGNESIA) suspension 30 mL  30 mL Oral Daily PRN Shuvon Rankin, NP      . multivitamin with minerals tablet 1 tablet  1 tablet Oral Daily Shuvon Rankin, NP   1 tablet at 11/07/12 0833  . ondansetron (ZOFRAN-ODT) disintegrating tablet 4 mg  4 mg Oral Q6H PRN Shuvon Rankin, NP      . potassium chloride SA (K-DUR,KLOR-CON) CR tablet 20 mEq  20 mEq Oral BID Shuvon Rankin, NP   20 mEq at 11/07/12 0834  . thiamine (VITAMIN B-1) tablet 100 mg  100 mg Oral Daily Shuvon Rankin, NP   100 mg at 11/07/12 0833  . [START ON 11/13/2012] Vitamin D (Ergocalciferol) (DRISDOL) capsule 50,000 Units  50,000 Units Oral Q7 days Shuvon Rankin, NP        Observation Level/Precautions:  15  minute checks  Laboratory:  Reviewed  ED lab findings on file  Psychotherapy: Group sessions, AA/NA meetings   Medications:  See medication lists  Consultations:  As needed  Discharge Concerns: Safety/sobriety    Estimated LOS: 3-5 days  Other:     I certify that inpatient services furnished can reasonably be expected to improve the patient's condition.   Armandina Stammer I, PMHNP-BC 6/5/201410:53 AM  Patient is personally seen and examined. Reviewed the information documented and agree with the treatment plan.  Cedricka Sackrider,JANARDHAHA R. 11/07/2012 6:38 PM

## 2012-11-07 NOTE — [facility] Group Notes (Signed)
[facility] LCSW Group Therapy  11/07/2012  1:15 PM   Type of Therapy:  Group Therapy  Participation Level:  Minimal  Participation Quality:  Minimal  Affect:  Drowsy  Cognitive:  Lacking  Insight:  Limited  Engagement in Therapy: Lacking  Modes of Intervention:  Activity, Clarification, Confrontation, Discussion, Education, Exploration, Limit-setting, Orientation, Problem-solving, Rapport Building, Dance movement psychotherapist, Socialization and Support  Summary of Progress/Problems: The topic for group was balance in life.  Pt slept throughout group so she was unable to participate or participate in the group discussion.    Reyes Ivan, LCSWA 11/07/2012 2:28 PM

## 2012-11-07 NOTE — [facility] Suicide Risk Assessment (Signed)
Suicide Risk Assessment  Admission Assessment     Nursing information obtained from:  Patient Demographic factors:  Unemployed Current Mental Status:  NA Loss Factors:  Decline in physical health;Legal issues Historical Factors:  Family history of mental illness or substance abuse Risk Reduction Factors:  Responsible for children under 56 years of age;Sense of responsibility to family;Positive social support  CLINICAL FACTORS:   Alcohol/Substance Abuse/Dependencies  COGNITIVE FEATURES THAT CONTRIBUTE TO RISK:  Polarized thinking    SUICIDE RISK:   Minimal: No identifiable suicidal ideation.  Patients presenting with no risk factors but with morbid ruminations; may be classified as minimal risk based on the severity of the depressive symptoms  PLAN OF CARE: Admitted voluntarily for alcohol detox treatment and has no safety issues. She wishes to participate in rehab treatment later.  I certify that inpatient services furnished can reasonably be expected to improve the patient's condition.  [name] [name],JANARDHAHA R. 11/07/2012, 4:13 PM

## 2012-11-07 NOTE — Progress Notes (Signed)
D. Pt has been up and has been visible and participating in various milieu activities this afternoon. Pt spoke about how she has been drinking too much and needed to come in to detox. Pt admitted drinking daily for years and has spoken about being in treatment for alcohol in the past. Pt does endorse feelings of depression and hopelessness but denies any SI. Pt's goal is to being able to stop drinking. Pt has received all medications without incident. A. Support and encouragement provided, medication education complete. R. Pt verbalized understanding, will continue to monitor.

## 2012-11-07 NOTE — BHH Counselor (Signed)
Adult Comprehensive Assessment  [name], female   DOB: 1955-08-29, 57 y.o.   MRN: [id number]  Information Source: Information source: Patient  Current Stressors:  Educational / Learning stressors: N/A Employment / Job issues: On disability Family Relationships: N/A Financial / Lack of resources (include bankruptcy): N/A Housing / Lack of housing: N/A Physical health (include injuries & life threatening diseases): N/A Social relationships: N/A Substance abuse: Alcohol dependence Bereavement / Loss: N/A  Living/Environment/Situation:  Living Arrangements: Spouse/significant other;Children Living conditions (as described by patient or guardian): Pt states that she lives with boyfriend and 2 grand children she raises.  Pt states that her home is nice.   How long has patient lived in current situation?: 11 years What is atmosphere in current home: Supportive;Loving;Comfortable  Family History:  Marital status: Long term relationship Long term relationship, how long?: 27 years What types of issues is patient dealing with in the relationship?: Pt reports no issues Additional relationship information: N/A Does patient have children?: Yes How many children?: 1 How is patient's relationship with their children?: Pt states that she doesn't really have a good relationship with her daughter because she is not dependable and pt is raising her kids  Childhood History:  By whom was/is the patient raised?: Mother Additional childhood history information: Pt states that her childhood was great.   Description of patient's relationship with caregiver when they were a child: Pt states that she got along great with her mother and loved her to death.   Patient's description of current relationship with people who raised him/her: Mother is deceased Does patient have siblings?: Yes Number of Siblings: 41 Description of patient's current relationship with siblings: 1 sibling passed  away.  Pt states that she only has a close relationship with twin sister.   Did patient suffer any verbal/emotional/physical/sexual abuse as a child?: No Did patient suffer from severe childhood neglect?: No Has patient ever been sexually abused/assaulted/raped as an adolescent or adult?: No Was the patient ever a victim of a crime or a disaster?: No Witnessed domestic violence?: No Has patient been effected by domestic violence as an adult?: No  Education:  Highest grade of school patient has completed: 11th grade Currently a student?: No Learning disability?: No  Employment/Work Situation:   Employment situation: On disability Why is patient on disability: Pt states that she kept falling and couldn't stand on her feet How long has patient been on disability: 8 years Patient's job has been impacted by current illness: No What is the longest time patient has a held a job?: 9-10 years Where was the patient employed at that time?: [employer] Has patient ever been in the [employer]?: No Has patient ever served in Buyer, retail?: No  Financial Resources:   Surveyor, quantity resources: Pharmacologist;Food stamps Does patient have a representative payee or guardian?: No  Alcohol/Substance Abuse:   What has been your use of drugs/alcohol within the last 12 months?: Alcohol - fifth of wine daily for the past 15 years If attempted suicide, did drugs/alcohol play a role in this?: No Alcohol/Substance Abuse Treatment Hx: Past Tx, Inpatient;Past Tx, Outpatient;Attends AA/NA;Past detox If yes, describe treatment: [facility] - 2 months ago, [facility] for detox 2 months ago prior to [facility] Has alcohol/substance abuse ever caused legal problems?: Yes (2 DUI's - 1 currently pending)  Social Support System:   Patient's Community Support System: Good Describe Community Support System: Pt states that her boyfriend and family are supportive Type of  faith/religion:  Baptist How does patient's faith help to cope with current illness?: occasional church attendance, prayer  Leisure/Recreation:   Leisure and Hobbies: Reading, puzzles, walk and cook  Strengths/Needs:   What things does the patient do well?: Pt states that she is a good cook In what areas does patient struggle / problems for patient: Alcohol detox  Discharge Plan:   Does patient have access to transportation?: Yes Will patient be returning to same living situation after discharge?: Yes Currently receiving community mental health services: Yes (From Whom) (currently has a therapist in [place]) If no, would patient like referral for services when discharged?: Yes (What county?) [facility] [place] - pt wants to go to [facility] after detox) Does patient have financial barriers related to discharge medications?: No  Summary/Recommendations:     Patient is a 57 year old African American Female with a diagnosis of Alcohol Dependence.  Patient lives in [place] with her family.  Patient will benefit from crisis stabilization, medication evaluation, group therapy and psycho education in addition to case management for discharge planning.    Horton, Salome Arnt. 11/07/2012

## 2012-11-07 NOTE — BHH Group Notes (Signed)
[facility] LCSW Aftercare Discharge Planning Group Note   11/07/2012 8:45 AM  Participation Quality:  Alert and Appropriate   Mood/Affect:  Appropriate  Depression Rating:  Denies any depression  Anxiety Rating:  Denies any anxiety  Thoughts of Suicide:  Pt denies SI/HI  Will you contract for safety?   Yes  Current AVH:  No  Plan for Discharge/Comments:  Pt attended discharge planning group and actively participated in group.  CSW provided pt with today's workbook.  Pt states that she came to the hospital for alcohol detox.  Pt states that her therapist recommended she come here and then go to [facility] for further treatment after detox.  Pt states that she had a court date on 5/28 but it has been continued.  Pt states that she lives in [place] with boyfriend and grandchildren and can return home there.  CSW will assess for referral to [facility] when pt is stable.  No further needs voiced by pt at this time.    Transportation Means: Pt states that she has access to transportation  Supports: Pt names her daughter, family, boyfriend and therapist as her supports.    Reyes Ivan, LCSWA 11/07/2012 9:59 AM

## 2012-11-07 NOTE — Progress Notes (Signed)
Recreation Therapy Notes  Date: 06.05.2014 Time: 3:00pm Location: [address] Dayroom      Group Topic/Focus: Leisure/Lifestyle Changes  Participation Level: Active  Participation Quality: Appropriate and Attentive  Affect: Euthymic  Cognitive: Appropriate   Additional Comments: Activity: Adapted Toilet Paper Game; Explanation: Patients were asked to select the amount of toilet paper they estimated they will need for the rest of the afternoon. Patients were asked to count their squares and announce the number to the group. Patients were then asked to break into groups of 2 -3 patients, using the average number of squares (11) patient groups were asked to identify that number of ways they can positively make changes to their lives. Group lists were then compared on the white board. Opening group discussion focused on the types of lifestyle changes that can be made.   Patient actively participated in group activity. Patient worked well with peer in group. Patient with peer developed a list of primarily life enriching activities, such as pray and take a class. Patient was unable to identify one suggestion written on the board she could not implement into her life. Patient listened to wrap up discussion about the importance of positive life changes to sobriety and overall wellbeing.   [name] [name] [name] [name], LRT/CTRS  Jearl Klinefelter 11/07/2012 4:52 PM

## 2012-11-08 DIAGNOSIS — F10239 Alcohol dependence with withdrawal, unspecified: Secondary | ICD-10-CM

## 2012-11-08 DIAGNOSIS — F1020 Alcohol dependence, uncomplicated: Secondary | ICD-10-CM | POA: Diagnosis not present

## 2012-11-08 MED ORDER — ENSURE COMPLETE PO LIQD
237.0000 mL | Freq: Two times a day (BID) | ORAL | Status: DC
  Administered 2012-11-09 – 2012-11-10 (×3): 237 mL via ORAL

## 2012-11-08 MED ORDER — POTASSIUM CHLORIDE 20 MEQ/15ML (10%) PO LIQD
20.0000 meq | Freq: Two times a day (BID) | ORAL | Status: DC
  Administered 2012-11-08 – 2012-11-11 (×7): 20 meq via ORAL
  Filled 2012-11-08 (×11): qty 15

## 2012-11-08 MED ORDER — CALCIUM CARBONATE ANTACID 500 MG PO CHEW
6.0000 | CHEWABLE_TABLET | Freq: Three times a day (TID) | ORAL | Status: DC
  Administered 2012-11-08 – 2012-11-11 (×8): 1200 mg via ORAL
  Filled 2012-11-08 (×14): qty 6

## 2012-11-08 NOTE — BHH Group Notes (Signed)
[facility] LCSW Aftercare Discharge Planning Group Note   11/08/2012 8:45 AM  Participation Quality:  Alert and Appropriate   Mood/Affect:  Appropriate  Depression Rating:  0  Anxiety Rating:  0  Thoughts of Suicide:  Pt denies SI/HI  Will you contract for safety?   Yes  Current AVH:  No  Plan for Discharge/Comments:  Pt attended discharge planning group and actively participated in group.  CSW provided pt with today's workbook.   Pt reports good sleep last night.  CSW will assess for referral to [facility] when pt is stable.  No further needs voiced by pt at this time.    Transportation Means: Pt states that she has access to transportation  Supports: Pt names her daughter, family, boyfriend and therapist as her supports.    Reyes Ivan, LCSWA 11/08/2012 9:43 AM

## 2012-11-08 NOTE — Progress Notes (Signed)
NUTRITION ASSESSMENT  Patient meets criteria for severe malnutrition related to social and environmental causes AEB 31% weight loss in the past year, decreased body fat and muscle mass.  Pt identified as at risk on the Malnutrition Screen Tool  INTERVENTION: 1. Educated patient on the importance of nutrition and encouraged intake of food and beverages. 2. Discussed weight goals. 3. Supplements:  Ensure Complete po BID, each supplement provides 350 kcal and 13 grams of protein. 4.  MVI, thiamine, vitamin D, iron, folic acid daily.  NUTRITION DIAGNOSIS: Unintentional weight loss related to sub-optimal intake as evidenced by pt report.   Goal: Pt to meet >/= 90% of their estimated nutrition needs.  Monitor:  PO intake  Assessment:  Patient admitted for ETOH detox.  Patient has a hx of pancreatitis.  Reports good intake currently with poor intake prior to admit secondary to ETOH abuse.  Patient reports UBW of 120-130 lbs 1 year ago.    57 y.o. female  Height: Ht Readings from Last 1 Encounters:  11/06/12 5\' 1"  (1.549 m)    Weight: Wt Readings from Last 1 Encounters:  11/06/12 83 lb (37.649 kg)    Weight Hx: Wt Readings from Last 10 Encounters:  11/06/12 83 lb (37.649 kg)  11/06/12 83 lb (37.649 kg)  11/06/12 83 lb 12.8 oz (38.011 kg)  10/30/12 83 lb 3.2 oz (37.739 kg)  10/08/12 87 lb 6.4 oz (39.644 kg)  08/14/12 83 lb 11.2 oz (37.966 kg)  08/09/12 99 lb 6.8 oz (45.1 kg)  08/07/12 99 lb 6.4 oz (45.088 kg)  08/04/12 87 lb (39.463 kg)  07/09/12 82 lb 11.2 oz (37.512 kg)    BMI:  Body mass index is 15.69 kg/(m^2). Pt meets criteria for underweight based on current BMI.  Estimated Nutritional Needs: Kcal: 25-30 kcal/kg Protein: > 1 gram protein/kg Fluid: 1 ml/kcal  Diet Order: General Pt is also offered choice of unit snacks mid-morning and mid-afternoon.  Pt is eating as desired.   Lab results and medications reviewed.   Oran Rein, RD, LDN Clinical Inpatient  Dietitian Pager:  [phone] Weekend and after hours pager:  [phone]

## 2012-11-08 NOTE — Progress Notes (Signed)
[facility] MD Progress Note  11/08/2012 5:17 PM [name] [name]  MRN:  [id number] Subjective:  [name] [name] is still evidencing S/S withdrawal. Her BP and pulse were both high this AM. She is still not sleeping too well. Her eating is still not back to her baseline Diagnosis:  Alcohol Dependence, withdrawal  ADL's:  Intact  Sleep: Fair  Appetite:  Poor  Suicidal Ideation:  Plan:  denies Intent:  denies Means:  denies Homicidal Ideation:  Plan:  denies Intent:  denies Means:  denies AEB (as evidenced by):  Psychiatric Specialty Exam: Review of Systems  HENT: Negative.   Eyes: Negative.   Respiratory: Negative.   Cardiovascular: Negative.   Gastrointestinal: Negative.   Genitourinary: Negative.   Musculoskeletal: Negative.   Skin: Negative.   Neurological: Positive for weakness.  Endo/Heme/Allergies: Negative.   Psychiatric/Behavioral: Positive for substance abuse. The patient is nervous/anxious and has insomnia.     Blood pressure 104/70, pulse 61, temperature 99.4 F (37.4 C), temperature source Oral, resp. rate 18, height 5\' 1"  (1.549 m), weight 37.649 kg (83 lb).Body mass index is 15.69 kg/(m^2).  General Appearance: Disheveled  Eye Solicitor::  Fair  Speech:  Clear and Coherent, Slow and not spontaneous  Volume:  Decreased  Mood:  Depressed and worried  Affect:  Restricted  Thought Process:  Coherent and Goal Directed  Orientation:  Full (Time, Place, and Person)  Thought Content:  worries, concerns  Suicidal Thoughts:  No  Homicidal Thoughts:  No  Memory:  Immediate;   Fair Recent;   Fair Remote;   Fair  Judgement:  Fair  Insight:  Present  Psychomotor Activity:  Restlessness  Concentration:  Fair  Recall:  Fair  Akathisia:  No  Handed:  Right  AIMS (if indicated):     Assets:  Desire for Improvement  Sleep:  Number of Hours: 6.75   Current Medications: Current Facility-Administered Medications  Medication Dose Route Frequency Provider Last Rate Last Dose  .  acetaminophen (TYLENOL) tablet 650 mg  650 mg Oral Q6H PRN Shuvon Rankin, NP      . alum & mag hydroxide-simeth (MAALOX/MYLANTA) 200-200-20 MG/5ML suspension 30 mL  30 mL Oral Q4H PRN Shuvon Rankin, NP      . amLODipine (NORVASC) tablet 10 mg  10 mg Oral Daily Shuvon Rankin, NP   10 mg at 11/08/12 0811  . calcium carbonate (TUMS - dosed in mg elemental calcium) chewable tablet 1,200 mg of elemental calcium  6 tablet Oral TID Rachael Fee, MD      . chlordiazePOXIDE (LIBRIUM) capsule 25 mg  25 mg Oral Q6H PRN Shuvon Rankin, NP      . chlordiazePOXIDE (LIBRIUM) capsule 25 mg  25 mg Oral Once Shuvon Rankin, NP      . chlordiazePOXIDE (LIBRIUM) capsule 25 mg  25 mg Oral TID Rachael Fee, MD   25 mg at 11/08/12 1249   Followed by  . [START ON 11/09/2012] chlordiazePOXIDE (LIBRIUM) capsule 25 mg  25 mg Oral BH-qamhs Rachael Fee, MD       Followed by  . [START ON 11/10/2012] chlordiazePOXIDE (LIBRIUM) capsule 25 mg  25 mg Oral Daily Rachael Fee, MD      . Melene Muller ON 11/09/2012] feeding supplement (ENSURE COMPLETE) liquid 237 mL  237 mL Oral BID BM Jeoffrey Massed, RD      . ferrous sulfate tablet 325 mg  325 mg Oral BID WC Sanjuana Kava, NP   325 mg at 11/08/12 0811  .  FLUoxetine (PROZAC) capsule 10 mg  10 mg Oral Daily Shuvon Rankin, NP   10 mg at 11/08/12 1610  . folic acid (FOLVITE) tablet 1 mg  1 mg Oral Daily Sanjuana Kava, NP   1 mg at 11/08/12 9604  . gabapentin (NEURONTIN) capsule 600 mg  600 mg Oral TID Shuvon Rankin, NP   600 mg at 11/08/12 1249  . hydrochlorothiazide (HYDRODIURIL) tablet 25 mg  25 mg Oral Daily Shuvon Rankin, NP   25 mg at 11/08/12 5409  . hydrOXYzine (ATARAX/VISTARIL) tablet 25 mg  25 mg Oral Q6H PRN Shuvon Rankin, NP      . levETIRAcetam (KEPPRA) tablet 500 mg  500 mg Oral BID Shuvon Rankin, NP   500 mg at 11/08/12 0813  . loperamide (IMODIUM) capsule 2-4 mg  2-4 mg Oral PRN Shuvon Rankin, NP      . magnesium hydroxide (MILK OF MAGNESIA) suspension 30 mL  30 mL Oral Daily PRN  Shuvon Rankin, NP      . multivitamin with minerals tablet 1 tablet  1 tablet Oral Daily Shuvon Rankin, NP   1 tablet at 11/08/12 0813  . ondansetron (ZOFRAN-ODT) disintegrating tablet 4 mg  4 mg Oral Q6H PRN Shuvon Rankin, NP      . potassium chloride 20 MEQ/15ML (10%) liquid 20 mEq  20 mEq Oral BID Rachael Fee, MD   20 mEq at 11/08/12 [phone]  . thiamine (VITAMIN B-1) tablet 100 mg  100 mg Oral Daily Shuvon Rankin, NP   100 mg at 11/08/12 0814  . [START ON 11/13/2012] Vitamin D (Ergocalciferol) (DRISDOL) capsule 50,000 Units  50,000 Units Oral Q7 days Shuvon Rankin, NP        Lab Results: No results found for this or any previous visit (from the past 48 hour(s)).  Physical Findings: AIMS: Facial and Oral Movements Muscles of Facial Expression: None, normal Lips and Perioral Area: None, normal Jaw: None, normal Tongue: None, normal,Extremity Movements Upper (arms, wrists, hands, fingers): None, normal Lower (legs, knees, ankles, toes): None, normal, Trunk Movements Neck, shoulders, hips: None, normal, Overall Severity Severity of abnormal movements (highest score from questions above): None, normal Incapacitation due to abnormal movements: None, normal Patient's awareness of abnormal movements (rate only patient's report): No Awareness, Dental Status Current problems with teeth and/or dentures?: Yes Does patient usually wear dentures?: No  CIWA:  CIWA-Ar Total: 0 COWS:     Treatment Plan Summary: Daily contact with patient to assess and evaluate symptoms and progress in treatment Medication management  Plan: Supportive approach/coping skills/relapse prevention           Pursue and Complete the detox           Reassess and address the co morbidities  Medical Decision Making Problem Points:  Review of psycho-social stressors (1) Data Points:  Review of medication regiment & side effects (2)  I certify that inpatient services furnished can reasonably be expected to improve the  patient's condition.   [name] [name] A 11/08/2012, 5:17 PM

## 2012-11-08 NOTE — Tx Team (Signed)
Interdisciplinary Treatment Plan Update (Adult)  Date: 11/08/2012  Time Reviewed: 9:45 AM   Progress in Treatment: Attending groups: Yes Participating in groups: Yes Taking medication as prescribed:  Yes Tolerating medication:  Yes Family/Significant othe contact made: No, N/A Patient understands diagnosis: Yes Discussing patient identified problems/goals with staff: Yes Medical problems stabilized or resolved:  Yes Denies suicidal/homicidal ideation: Yes Patient has not harmed self or Others: Yes  New problem(s) identified: None Identified  Discharge Plan or Barriers:  Pt wants to go to [facility] for further treatment.  CSW will make this referral when pt is stable.  Pt also has outpatient services with [facility].    Additional comments: N/A  Reason for Continuation of Hospitalization: Withdrawal Symptoms Complete detox protocol  Estimated length of stay: 3 days, possibly d/c on Monday  For review of initial/current patient goals, please see plan of care.  Attendees: Patient:     Family:     Physician:  Dr. Geoffery Lyons 11/08/2012 9:48 AM   Nursing:   Alease Frame RN 11/08/2012 9:48 AM   Clinical Social Worker: Reyes Ivan, LCSWA 11/08/2012 9:48 AM   Other:  Quintella Reichert, RN 11/08/2012 9:48 AM   Other:  Trula Slade, LCSWA 11/08/2012 9:48 AM   Other:  Massie Kluver, Community Care Coordinator 11/08/2012 9:48 AM   Other:  Ronda Fairly, LCSWA 11/08/2012 9:48 AM    Scribe for Treatment Team:   Reyes Ivan, LCSWA  11/08/2012 9:48 AM

## 2012-11-08 NOTE — Progress Notes (Addendum)
D patient slept well last nite, going to DR for meals and appetite is better today, voicing no complaints of WD s/s today, taking meds as ordered by MD, attending group and participating, denies Si or HI and no AVH today. Patient is depressed and anxious today.energy level is normal and ability to pay attention is good, depressed 1/10 and hopeless 1/10 today. States she is going to "rehab" and is happy to be doing so. A q49min safety checks continue and support offered, encouraged to continue going to group R patient remains safe on the unit

## 2012-11-08 NOTE — Progress Notes (Signed)
BHH Group Notes:  (Nursing/MHT/Case Management/Adjunct)  Date:  11/07/2012  Time:  2100  Type of Therapy:  wrap up group  Participation Level:  Active  Participation Quality:  Appropriate, Attentive, Sharing and Supportive  Affect:  Appropriate  Cognitive:  Alert and Appropriate  Insight:  Appropriate  Engagement in Group:  Engaged  Modes of Intervention:  Clarification, Education and Support  Summary of Progress/Problems: Pt reports living in [place] with a friend and is the caretaker her two grandchildren aged 28 and 93. Pt plans to go to [facility] after discharge. Pt mentioned really enjoying the groups today, especially the self-esteem group.  [name] [name] 11/08/2012, 2:45 AM

## 2012-11-08 NOTE — Progress Notes (Signed)
Adult Psychoeducational Group Note  Date:  11/08/2012 Time:  2:04 PM  Group Topic/Focus:  Relapse Prevention Planning:   The focus of this group is to define relapse and discuss the need for planning to combat relapse.  Participation Level:  Active  Participation Quality:  Appropriate  Affect:  Appropriate  Cognitive:  Appropriate  Insight: Appropriate  Engagement in Group:  Developing/Improving  Modes of Intervention:  Activity, Discussion and Education  Additional Comments:  Patient was quiet during group, but was able to participate and contribute to an activity involving the creation of a relapse prevention plan. Patient shared experiences and remained appropriate during group.  Rodman Pickle R 11/08/2012, 2:04 PM

## 2012-11-08 NOTE — BHH Group Notes (Signed)
[facility] LCSW Group Therapy  11/08/2012  1:15 PM   Type of Therapy:  Group Therapy  Participation Level:  Active  Participation Quality:  Appropriate and Attentive  Affect:  Appropriate and Flat  Cognitive:  Alert and Appropriate  Insight:  Developing/Improving and Engaged  Engagement in Therapy:  Developing/Improving and Engaged  Modes of Intervention:  Activity, Clarification, Confrontation, Discussion, Education, Exploration, Limit-setting, Orientation, Problem-solving, Rapport Building, Dance movement psychotherapist, Socialization and Support  Summary of Progress/Problems: The topic for today was feelings about relapse.  Pt discussed what relapse prevention is to them and identified triggers that they are on the path to relapse.  Pt processed their feeling towards relapse and was able to relate to peers.  Pt discussed coping skills that can be used for relapse prevention.   Pt shared that she was able to stay sober for 7 and 5 months at a time.  Pt was able to process what she looked liked sober, stating that she went shopping, took care of the kids and cooked.  Pt states that she let her guard down for 1 second and bought alcohol at the store.  Pt was able to identify that her recent charges are a trigger for her and worries about facing jail time.  Pt actively participated in group discussion and provided feedback to peers.    Reyes Ivan, LCSWA 11/08/2012 2:19 PM

## 2012-11-09 DIAGNOSIS — F1020 Alcohol dependence, uncomplicated: Secondary | ICD-10-CM | POA: Diagnosis not present

## 2012-11-09 NOTE — Progress Notes (Signed)
[facility] MD Progress Note  11/09/2012 5:42 PM [name] [name]  MRN:  [id number] Subjective:  Patient reports doing well and feeling well.  She states she disappointed at herself for using alcohol desite her medical issues.  She reports slowly improving appetite and sleep.  She attended groups and is medication compliant.  We will encourage to eat as much as possible.  Noted fluctuation in her temperature and will address as needed.  Se denies SI/HI/AVH. Diagnosis:   Axis I: Alcohol Abuse and alcohol depend Axis II: Deferred Axis III:  Past Medical History  Diagnosis Date  . Anemia, B12 deficiency   . History of acute pancreatitis   . Right knee pain     No recent imaging on chart  . 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 Dr. Su Hilt, [facility]  . Hypertriglyceridemia   . GERD (gastroesophageal reflux disease)   . Vitamin D deficiency   . Subdural hematoma 02/2008    Likely 2/2 trauma from seizure from EtOH withdrawal, chronic in nature, sees Dr. Robyne Askew. Most recent CT head 10/2009 showing stable but persistent hematoma without mass effect.  . History of seizure disorder     Likely 2/2 alcohol abuse  . Hypocalcemia   . Hypomagnesemia   . Failure to thrive in childhood     Unclear etiology  . HTN (hypertension)   . Thrombocytopenia   . Anemia, macrocytic   . Hepatomegaly     On exam  . Alcohol abuse   . Joint pain   . Alcohol abuse   . Arthritis   . Vitamin D deficiency   . Menopause   . Pancreatitis   . Insomnia   . Hyperlipidemia   . Sinusitis   . Pernicious anemia   . Subdural hematoma   . Macrocytic anemia   . Hepatomegaly   . 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 Seizures     1.5 YRS  LAST ONE  . Depression   . Hepatitis     HEPATOMEGALY   . Fx humeral neck 04/17/2011    Transverse fracture- minimally displaced- managed as outpatient   . ABNORMAL PAP SMEAR, LGSIL 07/23/2008    Annotation: HPV  positive CIN I Dr. Su Hilt, [facility] Qualifier: Diagnosis of  By: Danae Chen    . Pneumonia 05/20/2012   Axis IV: other psychosocial or environmental problems and problems related to social environment Axis V: 51-60 moderate symptoms  ADL's:  Intact  Sleep: recorded 5.75 hrs of sleep last night  Appetite:  Fair  Suicidal Ideation:  Plan:  none Intent:  none Means:  none Homicidal Ideation:  Plan:  none Intent:  none Means:  none AEB (as evidenced by):  Psychiatric Specialty Exam: Review of Systems  Constitutional: Positive for fever (Temp variation noted. ) and malaise/fatigue (Reports feeling tired at times.).  HENT: Negative.   Eyes: Negative.   Respiratory: Negative.   Cardiovascular: Negative.   Gastrointestinal: Negative.        Reports poor appetite  Genitourinary: Negative.   Musculoskeletal: Negative.   Skin: Negative.   Endo/Heme/Allergies: Negative.   Psychiatric/Behavioral: Negative for depression, suicidal ideas, hallucinations and memory loss. Substance abuse: Alcohol dependence on detox with librium protocol. The patient is not nervous/anxious and does not have insomnia.     Blood pressure 100/68, pulse 130, temperature 98.8 F (37.1 C), temperature source Oral, resp. rate 18, height 5'  1" (1.549 m), weight 37.649 kg (83 lb).Body mass index is 15.69 kg/(m^2).  General Appearance: Casual and Fairly Groomed  Patent attorney::  Good  Speech:  Normal Rate  Volume:  Normal  Mood:  Euthymic  Affect:  Appropriate  Thought Process:  Coherent and Goal Directed  Orientation:  Full (Time, Place, and Person)  Thought Content:  WDL  Suicidal Thoughts:  No  Homicidal Thoughts:  No  Memory:  Immediate;   Good Recent;   Good Remote;   Good  Judgement:  Fair  Insight:  Fair  Psychomotor Activity:  Normal  Concentration:  Good  Recall:  Good  Akathisia:  NA  Handed:  Right  AIMS (if indicated):     Assets:  Desire for Improvement   Sleep:  Number of Hours: 5.75   Current Medications: Current Facility-Administered Medications  Medication Dose Route Frequency Provider Last Rate Last Dose  . acetaminophen (TYLENOL) tablet 650 mg  650 mg Oral Q6H PRN Shuvon Rankin, NP      . alum & mag hydroxide-simeth (MAALOX/MYLANTA) 200-200-20 MG/5ML suspension 30 mL  30 mL Oral Q4H PRN Shuvon Rankin, NP      . amLODipine (NORVASC) tablet 10 mg  10 mg Oral Daily Shuvon Rankin, NP   10 mg at 11/09/12 0821  . calcium carbonate (TUMS - dosed in mg elemental calcium) chewable tablet 1,200 mg of elemental calcium  6 tablet Oral TID Rachael Fee, MD   1,200 mg of elemental calcium at 11/09/12 1721  . chlordiazePOXIDE (LIBRIUM) capsule 25 mg  25 mg Oral Q6H PRN Shuvon Rankin, NP      . chlordiazePOXIDE (LIBRIUM) capsule 25 mg  25 mg Oral Once Shuvon Rankin, NP      . chlordiazePOXIDE (LIBRIUM) capsule 25 mg  25 mg Oral BH-qamhs Rachael Fee, MD   25 mg at 11/09/12 1610   Followed by  . [START ON 11/10/2012] chlordiazePOXIDE (LIBRIUM) capsule 25 mg  25 mg Oral Daily Rachael Fee, MD      . feeding supplement (ENSURE COMPLETE) liquid 237 mL  237 mL Oral BID BM Jeoffrey Massed, RD   237 mL at 11/09/12 1208  . ferrous sulfate tablet 325 mg  325 mg Oral BID WC Sanjuana Kava, NP   325 mg at 11/09/12 1721  . FLUoxetine (PROZAC) capsule 10 mg  10 mg Oral Daily Shuvon Rankin, NP   10 mg at 11/09/12 0820  . folic acid (FOLVITE) tablet 1 mg  1 mg Oral Daily Sanjuana Kava, NP   1 mg at 11/09/12 9604  . gabapentin (NEURONTIN) capsule 600 mg  600 mg Oral TID Shuvon Rankin, NP   600 mg at 11/09/12 1721  . hydrochlorothiazide (HYDRODIURIL) tablet 25 mg  25 mg Oral Daily Shuvon Rankin, NP   25 mg at 11/09/12 5409  . hydrOXYzine (ATARAX/VISTARIL) tablet 25 mg  25 mg Oral Q6H PRN Shuvon Rankin, NP      . levETIRAcetam (KEPPRA) tablet 500 mg  500 mg Oral BID Shuvon Rankin, NP   500 mg at 11/09/12 1721  . loperamide (IMODIUM) capsule 2-4 mg  2-4 mg Oral PRN Shuvon  Rankin, NP      . magnesium hydroxide (MILK OF MAGNESIA) suspension 30 mL  30 mL Oral Daily PRN Shuvon Rankin, NP      . multivitamin with minerals tablet 1 tablet  1 tablet Oral Daily Shuvon Rankin, NP   1 tablet at 11/09/12 0821  . ondansetron (ZOFRAN-ODT)  disintegrating tablet 4 mg  4 mg Oral Q6H PRN Shuvon Rankin, NP      . potassium chloride 20 MEQ/15ML (10%) liquid 20 mEq  20 mEq Oral BID Rachael Fee, MD   20 mEq at 11/09/12 1723  . thiamine (VITAMIN B-1) tablet 100 mg  100 mg Oral Daily Shuvon Rankin, NP   100 mg at 11/09/12 0821  . [START ON 11/13/2012] Vitamin D (Ergocalciferol) (DRISDOL) capsule 50,000 Units  50,000 Units Oral Q7 days Shuvon Rankin, NP        Lab Results: No results found for this or any previous visit (from the past 48 hour(s)).  Physical Findings: AIMS: Facial and Oral Movements Muscles of Facial Expression: None, normal Lips and Perioral Area: None, normal Jaw: None, normal Tongue: None, normal,Extremity Movements Upper (arms, wrists, hands, fingers): None, normal Lower (legs, knees, ankles, toes): None, normal, Trunk Movements Neck, shoulders, hips: None, normal, Overall Severity Severity of abnormal movements (highest score from questions above): None, normal Incapacitation due to abnormal movements: None, normal Patient's awareness of abnormal movements (rate only patient's report): No Awareness, Dental Status Current problems with teeth and/or dentures?: Yes Does patient usually wear dentures?: No  CIWA:  CIWA-Ar Total: 0 COWS:     Treatment Plan Summary: Daily contact with patient to assess and evaluate symptoms and progress in treatment Medication management  Plan:Plan: Continue with plan of care Continue crisis management Encourage to participate in group and individual sessions Continue medication management/ and review as needed Discharge  Plan in progress Address health issues /V/S as needed    Medical Decision Making Problem Points:   Established problem, stable/improving (1) Data Points:  Review and summation of old records (2)  I certify that inpatient services furnished can reasonably be expected to improve the patient's condition.   Dahlia Byes, C  PMHNP-BC 11/09/2012, 5:43 PM

## 2012-11-09 NOTE — Progress Notes (Signed)
D. Pt has been in room for much of the evening, unable to effectively participate in various group activities due to not feeling well because of withdrawal symptoms. Pt did speak some about relapsing and identify triggers that could possibly lead to relapse. Pt did endorse some feelings of depression and anxiety but has denied SI.  A. Support and encouragement provided, medication education complete. R. Pt verbalized understanding, will continue to monitor.

## 2012-11-09 NOTE — Progress Notes (Addendum)
Report received from B. McNichols. Writer entered patients room, she was lying in bed asleep eyes closed, respirations even and unlabored, no distress noted. Safety maintained on unit, will continue to monitor.

## 2012-11-09 NOTE — [facility] Group Notes (Signed)
[facility] Group Notes:  (Clinical Social Work)  11/09/2012     10-11AM  Summary of Progress/Problems:   The main focus of today's process group was for the patient to identify ways in which they have in the past sabotaged their own recovery. Motivational Interviewing was utilized to ask the group members what they get out of their substance use, and what reasons they may have for wanting to change.  The Stages of Change were explained using a handout, and patients identified where they currently are with regard to stages of change.  The patient expressed that she drinks in order to get things done around the house, but often it results in the opposite.  She often does not want to be bothered by the grandkids she cares for when she is drinking.  Sometimes she drinks because she is angry that her daughter will not come pick up the grandkids, so she has to keep taking care of them.  She wants to stop drinkjing because it changes who she is with the grandkids, even though she said she is never mean, she just knows she is different.  Also she is tired of being sick.  She said there is no alcohol left in her home, and the empty bottles are in the trash to be picked up while she is at [facility].    Type of Therapy:  Group Therapy - Process   Participation Level:  Active  Participation Quality:  Appropriate, Attentive, Sharing and Supportive  Affect:  Blunted and Excited  Cognitive:  Alert, Appropriate and Oriented  Insight:  Engaged  Engagement in Therapy:  Engaged  Modes of Intervention:  Education, Support and Processing, Motivational Interviewing  Ambrose Mantle, LCSW 11/09/2012, 12:04 PM

## 2012-11-09 NOTE — Progress Notes (Signed)
D   Pt reports feeling much better than when she first came in   She has been cooperative and appropriate in her interactions with others   She reports feeling disapointed in herself for her ETOH use and how it got her into trouble  She voices being vested in her treatment  A   Verbal support given  Medications administered and effectiveness monitored   Q 15 min checks R   Pt safe at present

## 2012-11-09 NOTE — Progress Notes (Signed)
Psychoeducational Group Note  Date:  11/10/2012 Time: 2100  Group Topic/Focus:  wrap up group  Participation Level: Did Not Attend  Participation Quality:  Not Applicable  Affect:  Not Applicable  Cognitive:  Not Applicable  Insight:  Not Applicable  Engagement in Group: Not Applicable  Additional Comments:  Pt remained in bed sleeping during group.  Shelah Lewandowsky 11/09/2012, 10:45 PM

## 2012-11-09 NOTE — Progress Notes (Signed)
Psychoeducational Group Note  Date:  11/09/2012 Time:  0945 am  Group Topic/Focus:  Identifying Needs:   The focus of this group is to help patients identify their personal needs that have been historically problematic and identify healthy behaviors to address their needs.  Participation Level:  None  Participation Quality:  Drowsy  Affect:  Lethargic  Cognitive:  Lacking  Insight:  None  Engagement in Group:  None  Additional Comments:    Andrena Mews 11/09/2012,3:32 PM

## 2012-11-10 DIAGNOSIS — F1020 Alcohol dependence, uncomplicated: Principal | ICD-10-CM

## 2012-11-10 DIAGNOSIS — F1010 Alcohol abuse, uncomplicated: Secondary | ICD-10-CM

## 2012-11-10 MED ORDER — LOPERAMIDE HCL 2 MG PO CAPS: 2.0000 mg | ORAL_CAPSULE | ORAL | Status: DC | PRN

## 2012-11-10 MED ORDER — HYDROCORTISONE 1 % EX CREA
TOPICAL_CREAM | CUTANEOUS | Status: DC | PRN
  Administered 2012-11-10: 1 via TOPICAL
  Filled 2012-11-10: qty 1.5

## 2012-11-10 NOTE — Progress Notes (Signed)
Pt has been in the bed asleep all evening. Slept through group. Awoke for scheduled librium. Reviewed fall precautions. Medicated without difficulty. Pt denied any complaints, no withdrawal noted/reported. She remains safe denying SI/HI/AVH. [name] [name]

## 2012-11-10 NOTE — [facility] Group Notes (Signed)
[facility] Group Notes:  (Clinical Social Work)  11/10/2012  10:00-11:00AM  Summary of Progress/Problems:   The main focus of today's process group was to   identify the patient's current support system and decide on other supports that can be put in place.  Four definitions/levels of support were discussed and an exercise was utilized to show how much stronger we become with additional supports.  An emphasis was placed on using counselor, doctor, therapy groups, 12-step groups, and problem-specific support groups to expand supports, as well as doing something different than has been done before. The patient expressed a willingness to add a stay at [facility], Alcoholics Anonymous meetings and a sponsor to her existing supports of husband, daughter, and therapist.  She feels her therapist is very supportive, and the counselor actually brought her to the hospital.  Type of Therapy:  Process Group with Motivational Interviewing  Participation Level:  Active  Participation Quality:  Appropriate, Attentive, Drowsy and Sharing  Affect:  Blunted  Cognitive:  Appropriate and Oriented  Insight:  Engaged  Engagement in Therapy:  Engaged  Modes of Intervention:   Education, Support and Processing, Activity  [employer], LCSW 11/10/2012, 12:26 PM

## 2012-11-10 NOTE — Progress Notes (Signed)
[facility] Group Notes:  (Nursing/MHT/Case Management/Adjunct)  Date:  11/10/2012  Time:  11:43 PM  Type of Therapy:  Group Therapy  Participation Level:  Minimal  Participation Quality:  Appropriate  Affect:  Appropriate  Cognitive:  Appropriate  Insight:  Improving  Engagement in Group:  Developing/Improving  Modes of Intervention:  Support  Summary of Progress/Problems: Pt. Stated her thoughts, feelings, and actions in healthy support systems.  Pt. Stated her plan to seeking long term treatment for detox.  Pt. Stated her discharge plans of changing her company to prevent relapse.  Sondra Come 11/10/2012, 11:43 PM

## 2012-11-10 NOTE — Progress Notes (Signed)
[name], female   DOB: 1956-01-09, 57 y.o.   MRN: [id number] [facility] MD Progress Note  11/10/2012 2:31 PM [name] [name]  MRN:  [id number]  Subjective:  Patient reports feeling a lot better today. Denies any withdrawal symptoms. Complained of left hand starting to feel fuffy. Denies any SIHI, AVH.  Diagnosis:   Axis I: Alcohol Abuse and alcohol depend Axis II: Deferred Axis III:  Past Medical History  Diagnosis Date  . Anemia, B12 deficiency   . History of acute pancreatitis   . Right knee pain     No recent imaging on chart  . 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 Dr. Su Hilt, [facility]  . Hypertriglyceridemia   . GERD (gastroesophageal reflux disease)   . Vitamin D deficiency   . Subdural hematoma 02/2008    Likely 2/2 trauma from seizure from EtOH withdrawal, chronic in nature, sees Dr. Robyne Askew. Most recent CT head 10/2009 showing stable but persistent hematoma without mass effect.  . History of seizure disorder     Likely 2/2 alcohol abuse  . Hypocalcemia   . Hypomagnesemia   . Failure to thrive in childhood     Unclear etiology  . HTN (hypertension)   . Thrombocytopenia   . Anemia, macrocytic   . Hepatomegaly     On exam  . Alcohol abuse   . Joint pain   . Alcohol abuse   . Arthritis   . Vitamin D deficiency   . Menopause   . Pancreatitis   . Insomnia   . Hyperlipidemia   . Sinusitis   . Pernicious anemia   . Subdural hematoma   . Macrocytic anemia   . Hepatomegaly   . 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 Seizures     1.5 YRS  LAST ONE  . Depression   . Hepatitis     HEPATOMEGALY   . Fx humeral neck 04/17/2011    Transverse fracture- minimally displaced- managed as outpatient   . ABNORMAL PAP SMEAR, LGSIL 07/23/2008    Annotation: HPV positive CIN I Dr. Su Hilt, [facility] Qualifier: Diagnosis of  By: Danae Chen    . Pneumonia 05/20/2012    Axis IV: other psychosocial or environmental problems and problems related to social environment Axis V: 51-60 moderate symptoms  ADL's:  Intact  Sleep: recorded 5.75 hrs of sleep last night  Appetite:  Fair  Suicidal Ideation:  Plan:  none Intent:  none Means:  none Homicidal Ideation:  Plan:  none Intent:  none Means:  none AEB (as evidenced by):  Psychiatric Specialty Exam: Review of Systems  Constitutional: Positive for fever (Temp variation noted. ) and malaise/fatigue (Reports feeling tired at times.).  HENT: Negative.   Eyes: Negative.   Respiratory: Negative.   Cardiovascular: Negative.   Gastrointestinal: Negative.        Reports poor appetite  Genitourinary: Negative.   Musculoskeletal: Negative.   Skin: Negative.   Endo/Heme/Allergies: Negative.   Psychiatric/Behavioral: Negative for depression, suicidal ideas, hallucinations and memory loss. Substance abuse: Alcohol dependence on detox with librium protocol. The patient is not nervous/anxious and does not have insomnia.     Blood pressure 119/75, pulse 120, temperature 99 F (37.2 C), temperature source Oral, resp. rate 18, height 5\' 1"  (1.549 m), weight 37.649 kg (83 lb).Body mass index is 15.69 kg/(m^2).  General Appearance: Casual and Fairly Groomed  Eye Contact::  Good  Speech:  Normal Rate  Volume:  Normal  Mood:  Euthymic  Affect:  Appropriate  Thought Process:  Coherent and Goal Directed  Orientation:  Full (Time, Place, and Person)  Thought Content:  WDL  Suicidal Thoughts:  No  Homicidal Thoughts:  No  Memory:  Immediate;   Good Recent;   Good Remote;   Good  Judgement:  Fair  Insight:  Fair  Psychomotor Activity:  Normal  Concentration:  Good  Recall:  Good  Akathisia:  NA  Handed:  Right  AIMS (if indicated):     Assets:  Desire for Improvement  Sleep:  Number of Hours: 6.25   Current Medications: Current Facility-Administered Medications  Medication Dose Route Frequency  Provider Last Rate Last Dose  . acetaminophen (TYLENOL) tablet 650 mg  650 mg Oral Q6H PRN Shuvon Rankin, NP   650 mg at 11/10/12 1303  . alum & mag hydroxide-simeth (MAALOX/MYLANTA) 200-200-20 MG/5ML suspension 30 mL  30 mL Oral Q4H PRN Shuvon Rankin, NP      . amLODipine (NORVASC) tablet 10 mg  10 mg Oral Daily Shuvon Rankin, NP   10 mg at 11/10/12 0824  . calcium carbonate (TUMS - dosed in mg elemental calcium) chewable tablet 1,200 mg of elemental calcium  6 tablet Oral TID Rachael Fee, MD   1,200 mg of elemental calcium at 11/10/12 1258  . chlordiazePOXIDE (LIBRIUM) capsule 25 mg  25 mg Oral Once Shuvon Rankin, NP      . feeding supplement (ENSURE COMPLETE) liquid 237 mL  237 mL Oral BID BM Jeoffrey Massed, RD   237 mL at 11/10/12 1022  . ferrous sulfate tablet 325 mg  325 mg Oral BID WC Sanjuana Kava, NP   325 mg at 11/10/12 0824  . FLUoxetine (PROZAC) capsule 10 mg  10 mg Oral Daily Shuvon Rankin, NP   10 mg at 11/10/12 0825  . folic acid (FOLVITE) tablet 1 mg  1 mg Oral Daily Sanjuana Kava, NP   1 mg at 11/10/12 0825  . gabapentin (NEURONTIN) capsule 600 mg  600 mg Oral TID Shuvon Rankin, NP   600 mg at 11/10/12 1258  . hydrochlorothiazide (HYDRODIURIL) tablet 25 mg  25 mg Oral Daily Shuvon Rankin, NP   25 mg at 11/10/12 0825  . levETIRAcetam (KEPPRA) tablet 500 mg  500 mg Oral BID Shuvon Rankin, NP   500 mg at 11/10/12 0824  . loperamide (IMODIUM) capsule 2-4 mg  2-4 mg Oral PRN Sanjuana Kava, NP      . magnesium hydroxide (MILK OF MAGNESIA) suspension 30 mL  30 mL Oral Daily PRN Shuvon Rankin, NP      . multivitamin with minerals tablet 1 tablet  1 tablet Oral Daily Shuvon Rankin, NP   1 tablet at 11/10/12 0824  . potassium chloride 20 MEQ/15ML (10%) liquid 20 mEq  20 mEq Oral BID Rachael Fee, MD   20 mEq at 11/10/12 [phone]  . thiamine (VITAMIN B-1) tablet 100 mg  100 mg Oral Daily Shuvon Rankin, NP   100 mg at 11/10/12 0825  . [START ON 11/13/2012] Vitamin D (Ergocalciferol) (DRISDOL)  capsule 50,000 Units  50,000 Units Oral Q7 days Shuvon Rankin, NP        Lab Results: No results found for this or any previous visit (from the past 48 hour(s)).  Physical Findings: AIMS: Facial and Oral Movements Muscles of Facial Expression: None, normal Lips and Perioral Area:  None, normal Jaw: None, normal Tongue: None, normal,Extremity Movements Upper (arms, wrists, hands, fingers): None, normal Lower (legs, knees, ankles, toes): None, normal, Trunk Movements Neck, shoulders, hips: None, normal, Overall Severity Severity of abnormal movements (highest score from questions above): None, normal Incapacitation due to abnormal movements: None, normal Patient's awareness of abnormal movements (rate only patient's report): No Awareness, Dental Status Current problems with teeth and/or dentures?: Yes Does patient usually wear dentures?: No  CIWA:  CIWA-Ar Total: 0 COWS:     Treatment Plan Summary: Daily contact with patient to assess and evaluate symptoms and progress in treatment Medication management  Plan: Supportive approach/coping skills/relapse prevention. Instructed patient to prop her left hand using a pillow when in bed or sitting up in a chair. Encouraged out of room, participation in group sessions and application of coping skills when distressed. Will continue to monitor response to/adverse effects of medications in use to assure effectiveness. Continue to monitor mood, behavior and interaction with staff and other patients. Continue current plan of care.  Medical Decision Making Problem Points:  Established problem, stable/improving (1) Data Points:  Review and summation of old records (2)  I certify that inpatient services furnished can reasonably be expected to improve the patient's condition.   Armandina Stammer I  PMHNP-BC 11/10/2012, 2:31 PM

## 2012-11-10 NOTE — Progress Notes (Signed)
Spoke with pt 1:1 who has been resting most of the evening. She did attend wrap up group. Denying any issues at this time. Remains weak and unsteady. Fall precautions reviewed. Encouraged fluids, snack and gave ensure. Supported, reassured. Medicated per orders without difficulty. Denies withdrawal symptoms. Denies SI/HI/AVh and safe.

## 2012-11-11 DIAGNOSIS — F1020 Alcohol dependence, uncomplicated: Secondary | ICD-10-CM | POA: Diagnosis not present

## 2012-11-11 MED ORDER — CALCIUM CARBONATE ANTACID 500 MG PO CHEW: 6.0000 | CHEWABLE_TABLET | Freq: Three times a day (TID) | ORAL | Status: DC

## 2012-11-11 MED ORDER — VITAMIN D (ERGOCALCIFEROL) 1.25 MG (50000 UNIT) PO CAPS: 50000.0000 [IU] | ORAL_CAPSULE | ORAL | Status: DC

## 2012-11-11 MED ORDER — FOLIC ACID 1 MG PO TABS: 1.0000 mg | ORAL_TABLET | Freq: Every day | ORAL | Status: DC

## 2012-11-11 MED ORDER — ADULT MULTIVITAMIN W/MINERALS CH: 1.0000 | ORAL_TABLET | Freq: Every day | ORAL | Status: DC

## 2012-11-11 MED ORDER — FERROUS SULFATE 325 (65 FE) MG PO TABS: 325.0000 mg | ORAL_TABLET | Freq: Two times a day (BID) | ORAL | Status: DC

## 2012-11-11 MED ORDER — LEVETIRACETAM 500 MG PO TABS: 500.0000 mg | ORAL_TABLET | Freq: Two times a day (BID) | ORAL | Status: DC

## 2012-11-11 MED ORDER — CYANOCOBALAMIN 250 MCG PO TABS: 250.0000 ug | ORAL_TABLET | Freq: Every day | ORAL | Status: DC

## 2012-11-11 MED ORDER — HYDROCHLOROTHIAZIDE 25 MG PO TABS: 25.0000 mg | ORAL_TABLET | Freq: Every day | ORAL | Status: DC

## 2012-11-11 MED ORDER — POTASSIUM CHLORIDE CRYS ER 20 MEQ PO TBCR: 20.0000 meq | EXTENDED_RELEASE_TABLET | Freq: Two times a day (BID) | ORAL | Status: DC

## 2012-11-11 MED ORDER — VITAMIN B-1 100 MG PO TABS: 100.0000 mg | ORAL_TABLET | Freq: Every day | ORAL | Status: DC

## 2012-11-11 MED ORDER — AMYLASE-LIPASE-PROTEASE 20-4.5-25 MU PO CPEP: 1.0000 | ORAL_CAPSULE | Freq: Three times a day (TID) | ORAL | Status: DC

## 2012-11-11 MED ORDER — AMLODIPINE BESYLATE 10 MG PO TABS: 10.0000 mg | ORAL_TABLET | Freq: Every day | ORAL | Status: DC

## 2012-11-11 MED ORDER — GABAPENTIN 300 MG PO CAPS: 600.0000 mg | ORAL_CAPSULE | Freq: Three times a day (TID) | ORAL | Status: DC

## 2012-11-11 MED ORDER — FLUOXETINE HCL 10 MG PO CAPS: 10.0000 mg | ORAL_CAPSULE | Freq: Every day | ORAL | Status: DC

## 2012-11-11 NOTE — Tx Team (Signed)
Interdisciplinary Treatment Plan Update (Adult)  Date: 11/11/2012  Time Reviewed: 12:02 PM   Progress in Treatment:  Attending groups: Yes  Participating in groups: Yes  Taking medication as prescribed: Yes  Tolerating medication: Yes  Family/Significant othe contact made: No, N/A  Patient understands diagnosis: Yes  Discussing patient identified problems/goals with staff: Yes  Medical problems stabilized or resolved: Yes  Denies suicidal/homicidal ideation: Yes  Patient has not harmed self or Others: Yes  New problem(s) identified: None Identified  Discharge Plan or Barriers: Pt will [facility] o[facility]p with [facility]. [facility] will continue to assist pt with admission into [facility]. Pt to return home with daughter and plans to attend AA daily.  Additional comments: N/A  Reason for Continuation of Hospitalization: none  Estimated length of stay: d/c today  For review of initial/current patient goals, please see plan of care.  Attendees:  Patient:    Family:    Physician: Dr. Geoffery Lyons  11/11/2012 12:03 PM   Nursing: Alease Frame RN  11/11/2012 12:03 PM   Clinical Social Worker: Herbert Seta Smart 11/11/2012 12:03 PM   Other: Quintella Reichert, RN  11/11/2012 12:04 PM   Other:   Other:     Other:   Scribe for Treatment Team:  Trula Slade LCSWA, 11/11/2012 12:04 PM

## 2012-11-11 NOTE — Progress Notes (Signed)
Pt was discharged home today. She denied any S/I H/I or A/V hallucinations.  She was given f/u appointment, rx, sample medications, hotline info booklet. She voiced understanding to all instructions provided. She declined the need for smoking cessation materials.   

## 2012-11-11 NOTE — BHH Group Notes (Signed)
[facility] LCSW Aftercare Discharge Planning Group Note   11/11/2012 11:21 AM  Participation Quality:  Appropriate   Mood/Affect:  Appropriate  Depression Rating:  0  Anxiety Rating:  0  Thoughts of Suicide:  No Will you contract for safety?   NA  Current AVH:  No  Plan for Discharge/Comments:  Pt will follow up at [facility] for S/A IOP. [facility] will continue working with pt to assist with admission into [facility]. Pt will return home with daughter after d/c.   Transportation Means: daughter  Supports: Archivist, Research scientist (physical sciences)

## 2012-11-11 NOTE — Progress Notes (Signed)
[facility] Adult Case Management Discharge Plan :  Will you be returning to the same living situation after discharge: Yes,  home with daughter At discharge, do you have transportation home?:Yes,  daughter Do you have the ability to pay for your medications:Yes,  mental health  Release of information consent forms completed and in the chart;  Patient's signature needed at discharge.  Patient to Follow up at: Follow-up Information   Follow up with [facility]. (Call for treatment beds. )    Contact information:   [address] [place], [place] [address] Phone:[phone] Fax: [phone]      Follow up with [facility] On 11/12/2012. (Walk in Tuesday 9AM for substance abuse intensive out-patient services.)    Contact information:   [address] [place], [place] [address] Phone: [phone] Fax: [phone]      Patient denies SI/HI:   Yes,  in group    Safety Planning and Suicide Prevention discussed:  Yes,  pt not suicidal upon admission or during stay at [facility]. SPE not necessary.   Smart, [name] [name] 11/11/2012, 12:00 PM

## 2012-11-11 NOTE — BHH Suicide Risk Assessment (Signed)
Suicide Risk Assessment  Discharge Assessment     Demographic Factors:  NA  Mental Status Per Nursing Assessment::   On Admission:  NA  Current Mental Status by Physician: In full contact with reality. There are no suicidal ideas, plans or intent. Her mood is euthymic, her affect is appropriate. She is wanting to be discharged today. She is wanting to go to [facility] when a bed becomes available. She is able to verbalize that she understands the medical consequences of her relapses.   Loss Factors: Decline in physical health  Historical Factors: NA  Risk Reduction Factors:   Positive social support and Positive therapeutic relationship  Continued Clinical Symptoms:  Alcohol/Substance Abuse/Dependencies  Cognitive Features That Contribute To Risk:  Closed-mindedness Polarized thinking Thought constriction (tunnel vision)    Suicide Risk:  Minimal: No identifiable suicidal ideation.  Patients presenting with no risk factors but with morbid ruminations; may be classified as minimal risk based on the severity of the depressive symptoms  Discharge Diagnoses:   AXIS I:  Alcohol Dependence AXIS II:  Deferred AXIS III:   Past Medical History  Diagnosis Date  . Anemia, B12 deficiency   . History of acute pancreatitis   . Right knee pain     No recent imaging on chart  . 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 Dr. Su Hilt, [facility]  . Hypertriglyceridemia   . GERD (gastroesophageal reflux disease)   . Vitamin D deficiency   . Subdural hematoma 02/2008    Likely 2/2 trauma from seizure from EtOH withdrawal, chronic in nature, sees Dr. Robyne Askew. Most recent CT head 10/2009 showing stable but persistent hematoma without mass effect.  . History of seizure disorder     Likely 2/2 alcohol abuse  . Hypocalcemia   . Hypomagnesemia   . Failure to thrive in childhood     Unclear etiology  . HTN (hypertension)   .  Thrombocytopenia   . Anemia, macrocytic   . Hepatomegaly     On exam  . Alcohol abuse   . Joint pain   . Alcohol abuse   . Arthritis   . Vitamin D deficiency   . Menopause   . Pancreatitis   . Insomnia   . Hyperlipidemia   . Sinusitis   . Pernicious anemia   . Subdural hematoma   . Macrocytic anemia   . Hepatomegaly   . 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 Seizures     1.5 YRS  LAST ONE  . Depression   . Hepatitis     HEPATOMEGALY   . Fx humeral neck 04/17/2011    Transverse fracture- minimally displaced- managed as outpatient   . ABNORMAL PAP SMEAR, LGSIL 07/23/2008    Annotation: HPV positive CIN I Dr. Su Hilt, [facility] Qualifier: Diagnosis of  By: Danae Chen    . Pneumonia 05/20/2012   AXIS IV:  other psychosocial or environmental problems AXIS V:  61-70 mild symptoms  Plan Of Care/Follow-up recommendations:  Activity:  as tolerated Diet:  regular Follow up [facility] and outpatient providers Is patient on multiple antipsychotic therapies at discharge:  No   Has Patient had three or more failed trials of antipsychotic monotherapy by history:  No  Recommended Plan for Multiple Antipsychotic Therapies: N/A   [name] [name] A 11/11/2012, 12:35 PM

## 2012-11-11 NOTE — Discharge Summary (Signed)
Physician Discharge Summary Note  Patient:  [name] [name] is an 57 y.o., female MRN:  [id number] DOB:  Oct 29, 1955 Patient phone:  [phone] (home)  Patient address:   [address] [place] [address],   Date of Admission:  11/06/2012 Date of Discharge: 11/11/12  Reason for Admission: Alcohol intoxication  Discharge Diagnoses: Principal Problem:   Alcohol dependence  Review of Systems  Constitutional: Negative.   HENT: Negative.   Eyes: Negative.   Respiratory: Negative.   Cardiovascular: Negative.   Gastrointestinal: Negative.   Genitourinary: Negative.   Musculoskeletal: Positive for myalgias and joint pain (Prexisting).       Pre-existing  Skin: Negative.   Neurological: Negative.   Endo/Heme/Allergies: Negative.   Psychiatric/Behavioral: Positive for depression (Stabilized with medication prior to discharge) and substance abuse (Alcoholism). Negative for suicidal ideas, hallucinations and memory loss. The patient is nervous/anxious (Stabilized with medication prior to discharge). The patient does not have insomnia.    Axis Diagnosis:   AXIS I:  Alcohol dependence AXIS II:  Deferred AXIS III:   Past Medical History  Diagnosis Date  . Anemia, B12 deficiency   . History of acute pancreatitis   . Right knee pain     No recent imaging on chart  . 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 Dr. Su Hilt, [facility]  . Hypertriglyceridemia   . GERD (gastroesophageal reflux disease)   . Vitamin D deficiency   . Subdural hematoma 02/2008    Likely 2/2 trauma from seizure from EtOH withdrawal, chronic in nature, sees Dr. Robyne Askew. Most recent CT head 10/2009 showing stable but persistent hematoma without mass effect.  . History of seizure disorder     Likely 2/2 alcohol abuse  . Hypocalcemia   . Hypomagnesemia   . Failure to thrive in childhood     Unclear etiology  . HTN (hypertension)   .  Thrombocytopenia   . Anemia, macrocytic   . Hepatomegaly     On exam  . Alcohol abuse   . Joint pain   . Alcohol abuse   . Arthritis   . Vitamin D deficiency   . Menopause   . Pancreatitis   . Insomnia   . Hyperlipidemia   . Sinusitis   . Pernicious anemia   . Subdural hematoma   . Macrocytic anemia   . Hepatomegaly   . 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 Seizures     1.5 YRS  LAST ONE  . Depression   . Hepatitis     HEPATOMEGALY   . Fx humeral neck 04/17/2011    Transverse fracture- minimally displaced- managed as outpatient   . ABNORMAL PAP SMEAR, LGSIL 07/23/2008    Annotation: HPV positive CIN I Dr. Su Hilt, [facility] Qualifier: Diagnosis of  By: Danae Chen    . Pneumonia 05/20/2012   AXIS IV:  Substance dependence AXIS V:  63  Level of Care:  OP  Hospital Course:  This is a 57 year old African-American female. Admitted from the [facility] With complaints of alcohol intoxication requesting detox. Patient reports, "My therapist took me to the hospital yesterday for detox. I drank too much alcohol. I have been drinking for 20 years. I was drinking up to 7 glasses of wine everyday for 4 years. I was at the [facility] about 2 months ago for treatment. I got very sick and I was taken  to the hospital. But, I was not allowed to return back to the treatment center. I got pancreatitis from drinking too much. It has been 3 years that I was diagnosed with it. I also has a pending DUI court case. I was suppose to go to court last month, but it was postponed. I don't want to drink myself to death. I want to live. I got grand-babies.   Upon admission into this hospital,  and after admission assessment/evaluation, it was determined that patient will need detoxification treatment protocol to stabilize her system ofalcohol intoxication and to combat the withdrawal symptoms of this substance as well. And her discharge plans included a referral to a  long term treatment facility for more intense substance abuse treatment. [name] was then started on Librium protocol for her alcohol detoxification.She was also enrolled in group counseling sessions and activities where she was counseled and learned coping skills that should help her after discharge to cope better, manage her substance abuse problems to maintain a much longer sobriety. She also was enrolled and attended AA/NA meetings being offered and held on this unit. She does have some previous and or identifiable medical conditions that required treatment and or monitoring. She received medication management for all those health issues as well. She was monitored closely for any potential problems that may arise as a result of and or during detoxification treatment. Patient tolerated her treatment regimen and detoxification treatment without any significant adverse effects and or reactions.  Patient attended treatment team meeting this am and met with the team staff. Her reason for admission, present symptoms, substance abuse issues, response to treatment and discharge plans discussed. Patient endorsed that she is doing well and stable for discharge to pursue the next phase of her substance abuse treatment. It was then agreed upon that she will follow care at the [facility] here in [place], [place] on 11/12/12 at 09:00 am while waiting for a treatment bed availability at [facility]. She will call [facility] on a later date for bed availability. The addresses, dates, times and contact information for both [facility] and [facility] provided for patient in writing. She was also encouraged to join/attend AA/NA meetings being offered and held within her community.   Upon discharge, patient adamantly denies suicidal, homicidal ideations, auditory, visual hallucinations, delusional thinking and or withdrawal symptoms. Patient left [facility] with all personal belongings in no apparent distress. She  received 2 weeks worth supply samples of her [facility] discharge medications. Transportation per family   Consults:  psychiatry  Significant Diagnostic Studies:  labs: CBC with diff, CMP, UDS, Toxicology tests, U/A  Discharge Vitals:   Blood pressure 122/90, pulse 128, temperature 98.2 F (36.8 C), temperature source Oral, resp. rate 24, height 5\' 1"  (1.549 m), weight 37.649 kg (83 lb). Body mass index is 15.69 kg/(m^2). Lab Results:   No results found for this or any previous visit (from the past 72 hour(s)).  Physical Findings: AIMS: Facial and Oral Movements Muscles of Facial Expression: None, normal Lips and Perioral Area: None, normal Jaw: None, normal Tongue: None, normal,Extremity Movements Upper (arms, wrists, hands, fingers): None, normal Lower (legs, knees, ankles, toes): None, normal, Trunk Movements Neck, shoulders, hips: None, normal, Overall Severity Severity of abnormal movements (highest score from questions above): None, normal Incapacitation due to abnormal movements: None, normal Patient's awareness of abnormal movements (rate only patient's report): No Awareness, Dental Status Current problems with teeth and/or dentures?: Yes Does patient usually wear dentures?: No  CIWA:  CIWA-Ar Total: 0 COWS:     Psychiatric Specialty Exam: See Psychiatric Specialty Exam and Suicide Risk Assessment completed by Attending Physician prior to discharge.  Discharge destination:  Home  Is patient on multiple antipsychotic therapies at discharge:  No   Has Patient had three or more failed trials of antipsychotic monotherapy by history:  No  Recommended Plan for Multiple Antipsychotic Therapies: NA       Future Appointments Provider Department Dept Phone   12/03/2012 9:45 AM Aletta Edouard, MD [facility] [phone]       Medication List    STOP taking these medications       amLODipine 10 MG tablet  Commonly known as:  NORVASC     diclofenac  sodium 1 % Gel  Commonly known as:  VOLTAREN     magnesium oxide 400 MG tablet  Commonly known as:  MAG-OX      TAKE these medications     Indication   amylase-lipase-protease 20-4.5-25 MU per capsule  Commonly known as:  PANGESTYME EC  Take 1 capsule by mouth 3 (three) times daily with meals. For low pancreatic function   Indication:  Pancreatic Insufficiency     calcium carbonate 500 MG chewable tablet  Commonly known as:  TUMS  Chew 6 tablets (1,200 mg of elemental calcium total) by mouth 3 (three) times daily. For bone health   Indication:  For bone health     ferrous sulfate 325 (65 FE) MG tablet  Take 1 tablet (325 mg total) by mouth 2 (two) times daily with a meal. (May purchase from over the counter): For low Iron   Indication:  Iron Deficiency     FLUoxetine 10 MG capsule  Commonly known as:  PROZAC  Take 1 capsule (10 mg total) by mouth daily. For depression   Indication:  Anorexia Nervosa, Major Depressive Disorder     folic acid 1 MG tablet  Commonly known as:  FOLVITE  Take 1 tablet (1 mg total) by mouth daily. For folic acid replacement   Indication:  Deficiency of Folic Acid in the Diet, Anemia From Inadequate Folic Acid     gabapentin 300 MG capsule  Commonly known as:  NEURONTIN  Take 2 capsules (600 mg total) by mouth 3 (three) times daily. For anxiety/pain control   Indication:  Agitation, Neuropathic Pain, For anxiety     hydrochlorothiazide 25 MG tablet  Commonly known as:  HYDRODIURIL  Take 1 tablet (25 mg total) by mouth daily. For high blood pressure control   Indication:  High Blood Pressure     levETIRAcetam 500 MG tablet  Commonly known as:  KEPPRA  Take 1 tablet (500 mg total) by mouth 2 (two) times daily. For seizure activities   Indication:  Muscular Spasm or Twitch occurring with Seizures, Partial Onset Seizures, Seizure Prophylaxis following Subarachnoid Hemorrhage, Tonic-Clonic Seizures     multivitamin with minerals Tabs  Take 1  tablet by mouth daily. For vitamin replacement   Indication:  Low vitamin     omeprazole 40 MG capsule  Commonly known as:  PRILOSEC  Take 1 capsule (40 mg total) by mouth daily.      potassium chloride SA 20 MEQ tablet  Commonly known as:  KLOR-CON M20  Take 1 tablet (20 mEq total) by mouth 2 (two) times daily. For low potassium   Indication:  Low Amount of Potassium in the Blood     thiamine 100 MG tablet  Commonly known as:  VITAMIN B-1  Take 1 tablet (100 mg total) by mouth daily. For low thiamine   Indication:  Low thiamine     vitamin B-12 250 MCG tablet  Commonly known as:  CYANOCOBALAMIN  Take 1 tablet (250 mcg total) by mouth daily. For low B-12   Indication:  Inadequate Vitamin B12     Vitamin D (Ergocalciferol) 50000 UNITS Caps  Commonly known as:  DRISDOL  Take 1 capsule (50,000 Units total) by mouth every 7 (seven) days. Thursdays: For bone health   Indication:  Vitamin D Deficiency       Follow-up Information   Follow up with [facility]. (Call for treatment beds. )    Contact information:   [address] [place], [place] [address] Phone:[phone] Fax: [phone]      Follow up with [facility] On 11/12/2012. (Walk in Tuesday 9AM for substance abuse intensive out-patient services.)    Contact information:   [address] [place], [place] [address] Phone: [phone] Fax: [phone]     Follow-up recommendations:  Activity:  As tolerated Diet: As recommended by your primary care doctor. Keep all scheduled follow-up appointments as recommended. Continue to work the relapse prevention plan  Comments: Take all your medications as prescribed by your mental healthcare provider. Report any adverse effects and or reactions from your medicines to your outpatient provider promptly. Patient is instructed and cautioned to not engage in alcohol and or illegal drug use while on prescription medicines. In the event of worsening symptoms, patient  is instructed to call the crisis hotline, 911 and or go to the nearest ED for appropriate evaluation and treatment of symptoms. Follow-up with your primary care provider for your other medical issues, concerns and or health care needs.    Total Discharge Time:  Greater than 30 minutes.  Signed: Sanjuana Kava, PMHNP-BC  11/12/2012, 2:34 PM Agree with assessment and plan Madie Reno A.Dub Mikes, M.D.

## 2012-11-11 NOTE — Progress Notes (Signed)
Adult Psychoeducational Group Note  Date:  11/11/2012 Time:  3:30 PM  Group Topic/Focus:  Self Care:   The focus of this group is to help patients understand the importance of self-care in order to improve or restore emotional, physical, spiritual, interpersonal, and financial health.  Participation Level:  Active  Participation Quality:  Appropriate, Attentive and Supportive  Affect:  Appropriate  Cognitive:  Appropriate  Insight: Appropriate  Engagement in Group:  Engaged and Supportive  Modes of Intervention:  Activity, Exploration, Socialization and Support  Additional Comments:  Patient was attentive and supporting during group.  [name] D 11/11/2012, 3:30 PM

## 2012-11-12 ENCOUNTER — Encounter: Payer: Self-pay | Admitting: Internal Medicine

## 2012-11-12 ENCOUNTER — Ambulatory Visit (INDEPENDENT_AMBULATORY_CARE_PROVIDER_SITE_OTHER): Payer: PRIVATE HEALTH INSURANCE | Admitting: Internal Medicine

## 2012-11-12 DIAGNOSIS — R600 Localized edema: Secondary | ICD-10-CM

## 2012-11-12 DIAGNOSIS — R609 Edema, unspecified: Secondary | ICD-10-CM

## 2012-11-12 DIAGNOSIS — F1010 Alcohol abuse, uncomplicated: Secondary | ICD-10-CM

## 2012-11-12 DIAGNOSIS — M79602 Pain in left arm: Secondary | ICD-10-CM

## 2012-11-12 DIAGNOSIS — E8809 Other disorders of plasma-protein metabolism, not elsewhere classified: Secondary | ICD-10-CM

## 2012-11-12 DIAGNOSIS — I10 Essential (primary) hypertension: Secondary | ICD-10-CM

## 2012-11-12 DIAGNOSIS — M79609 Pain in unspecified limb: Secondary | ICD-10-CM

## 2012-11-12 DIAGNOSIS — E43 Unspecified severe protein-calorie malnutrition: Secondary | ICD-10-CM

## 2012-11-12 LAB — COMPLETE METABOLIC PANEL WITH GFR
ALT: 10 U/L (ref 0–35)
CO2: 16 mEq/L — ABNORMAL LOW (ref 19–32)
Calcium: 8.5 mg/dL (ref 8.4–10.5)
Chloride: 107 mEq/L (ref 96–112)
Creat: 1.51 mg/dL — ABNORMAL HIGH (ref 0.50–1.10)
GFR, Est African American: 45 mL/min — ABNORMAL LOW

## 2012-11-12 MED ORDER — AMLODIPINE BESYLATE 10 MG PO TABS: 5.0000 mg | ORAL_TABLET | Freq: Every day | ORAL | Status: DC

## 2012-11-12 MED ORDER — POTASSIUM CHLORIDE CRYS ER 20 MEQ PO TBCR: 20.0000 meq | EXTENDED_RELEASE_TABLET | Freq: Every day | ORAL | Status: DC

## 2012-11-12 MED ORDER — FUROSEMIDE 20 MG PO TABS: 20.0000 mg | ORAL_TABLET | Freq: Every day | ORAL | Status: DC

## 2012-11-12 NOTE — Progress Notes (Addendum)
Subjective:     [name], female   DOB: [birth year], 57 y.o.   MRN: [id number]  HPI Ms Moor is my patient struggling with chronic alcoholism, multiple electrolyte abnormalities including hypomagnesemia and hypocalcemia, hypertension and other pertinent past medical history as mentioned below, comes in for a follow up exam. She was recently checked into [facility] for detoxification from where she was discharged . She comes in with increased bilateral leg swelling, new left hand swelling and complaint of leaking her bowels while passing gas for the past 2 days. She otherwise feels normal, fatigued, but with no chest pain, dizziness, palpitations, no weakness or back pain.   Her bilateral leg swelling is an ongoing complaint, for which she was being treated with lasix and she had gotten better. I had asked her to decrease her lasix to 20 mg however, with worsening electrolyte balance, she was asked to stop it completely. Her leg swelling has returned post that.   She also has left hand swelling, which started suddenly while she was in rehab. She cannot remember any falls. She does have mild tenderness in her left arm, however was easily able to work with it and did not limitation of movement. She has an Xray from 6/4 which rules out fracture. Her radial pulse is good, and she denies any numbness of tingling in her hand.   She complains about having loose BM leak out when she passes gas, off and on since the past 2 days. This is new to her. She does not have any weakness in her legs, or back pain.    She has a past medical history of Anemia, B12 deficiency; History of acute pancreatitis; Right knee pain; Abnormal Pap smear and cervical HPV (human papillomavirus); Hypertriglyceridemia; GERD (gastroesophageal reflux disease); Vitamin D deficiency; Subdural hematoma (02/2008); History of seizure disorder; Hypocalcemia; Hypomagnesemia; Failure to thrive in childhood; HTN (hypertension);  Thrombocytopenia; Anemia, macrocytic; Hepatomegaly; Alcohol abuse; Joint pain; Alcohol abuse; Arthritis; Vitamin D deficiency; Menopause; Pancreatitis; Insomnia; Hyperlipidemia; Sinusitis; Pernicious anemia; Subdural hematoma; Macrocytic anemia; Hepatomegaly; Tuberculosis; Seizures; Depression; Hepatitis; Fx humeral neck (04/17/2011); ABNORMAL PAP SMEAR, LGSIL (07/23/2008); and Pneumonia (05/20/2012).  Review of Systems As per HPI    Objective:   Physical Exam  Constitutional: She is oriented to person, place, and time. No distress.  Cachectic, african [place] female.   HENT:  Head: Normocephalic and atraumatic.  Eyes: Conjunctivae and EOM are normal. Pupils are equal, round, and reactive to light.  Neck: Normal range of motion. Neck supple.  Cardiovascular: Normal rate, regular rhythm and normal heart sounds.   Pulmonary/Chest: Effort normal and breath sounds normal.  Abdominal: Soft.  Genitourinary:  PR exam done: Patient has multiple hemorrhoids - internal prolapsed and external, no bleeding seen, normal anal sphincter tone, no masses felt. Good anal wink bilaterally.    Musculoskeletal: She exhibits edema (Bilateral 1+ pitting edema mostly around ankles).  Left arm: Soft tissue edema just below elbow, mildly tender, no erythema, no warmth, no limitation of range of movement.  Neurological: She is alert and oriented to person, place, and time. She displays normal reflexes. She exhibits normal muscle tone. Coordination normal.       Assessment & Plan:     1. Elbow soft tissue edema - I am unsure of the etiology. Fracture is ruled out. I will refer the patient to sports medicine since this has not improved in a week. We will provider a sling.  2. Bilateral Leg Edema - Multifactorial - Hypoalbuminemia,  amlodipine, gabapentin, venous stasis, could be factors. Decreased amlodipine dose to 5 mg and started lasix 20 mg daily. We will given her ted hose.   3. Electrolyte Imbalances: CMP  today shows high normal potassium. Apparently, the patient stopped her lasix, and had been taking her potassium pills. Asked her to decrease her potassium pills to 20 meQ once a day, and she will also start lasix which should compensate. We will monitor calcium while she is on lasix. Her magnesium is 0.9 again, and she will be instructed to keep taking magnesium.   4. Alcohol abstinence - The patient claims abstinence so far after detox. She is waiting for a rehab bed she tells me.   5. BM incontinence - Unclear etiology. There seems to be good anal sphincter tone. I do not see anything except a few hemorrhoids prolapsing out. This could possibly be a reason, however, why this would cause sudden incontinence I am not sure. This problem is 2 day old and I do not see any alarm signs, thus I will follow it. If this does not get better, I will give the patient a GI referral.   I had originally thought to call her back in 15 days, but keeping her magnesium in mind, I would call her for a BMP and Mg check in 7 days.

## 2012-11-12 NOTE — Patient Instructions (Addendum)
Ms Proctor,  Please keep your legs elevated. Take lasix 20 mg daily for 15 days. Decrease Amlodipine to 5 mg daily.  Please keep your arm in a sling and let it rest. You do not have arm fracture in your Xray.  If your BMs continue to leak out, please call us within this week. We will give you a GI specialist referral.  I will call you with blood work. Please keep clean from alcohol. We will keep compression stockings and sling ready for you to pick up.   Let us meet in 15 days or sooner if you don't feel better.  Thanks, Aletta Edouard MD MPH 11/12/2012 11:32 AM

## 2012-11-14 NOTE — Progress Notes (Signed)
Patient Discharge Instructions:  After Visit Summary (AVS):   Faxed to:  11/14/12 Discharge Summary Note:   Faxed to:  11/14/12 Psychiatric Admission Assessment Note:   Faxed to:  11/14/12 Suicide Risk Assessment - Discharge Assessment:   Faxed to:  11/14/12 Faxed/Sent to the Next Level Care provider:  11/14/12 Faxed to [facility] @ [phone] Faxed to [facility] @ [phone]  Jerelene Redden, 11/14/2012, 12:56 PM

## 2012-11-18 ENCOUNTER — Other Ambulatory Visit: Payer: Self-pay | Admitting: *Deleted

## 2012-11-18 NOTE — Telephone Encounter (Signed)
This is not made anymore, needs to be changed to creon and could we do 90 day supply?

## 2012-11-24 ENCOUNTER — Encounter (HOSPITAL_COMMUNITY): Payer: Self-pay | Admitting: Adult Health

## 2012-11-24 ENCOUNTER — Emergency Department (HOSPITAL_COMMUNITY)
Admission: EM | Admit: 2012-11-24 | Discharge: 2012-11-26 | Disposition: A | Discharge status: Other Acute Inpatient Hospital | Payer: PRIVATE HEALTH INSURANCE | Attending: Emergency Medicine | Admitting: Emergency Medicine

## 2012-11-24 DIAGNOSIS — D518 Other vitamin B12 deficiency anemias: Secondary | ICD-10-CM | POA: Insufficient documentation

## 2012-11-24 DIAGNOSIS — Z8742 Personal history of other diseases of the female genital tract: Secondary | ICD-10-CM | POA: Insufficient documentation

## 2012-11-24 DIAGNOSIS — I10 Essential (primary) hypertension: Secondary | ICD-10-CM | POA: Insufficient documentation

## 2012-11-24 DIAGNOSIS — Z8719 Personal history of other diseases of the digestive system: Secondary | ICD-10-CM | POA: Insufficient documentation

## 2012-11-24 DIAGNOSIS — R197 Diarrhea, unspecified: Secondary | ICD-10-CM | POA: Insufficient documentation

## 2012-11-24 DIAGNOSIS — G4700 Insomnia, unspecified: Secondary | ICD-10-CM | POA: Insufficient documentation

## 2012-11-24 DIAGNOSIS — K219 Gastro-esophageal reflux disease without esophagitis: Secondary | ICD-10-CM | POA: Insufficient documentation

## 2012-11-24 DIAGNOSIS — R569 Unspecified convulsions: Secondary | ICD-10-CM | POA: Insufficient documentation

## 2012-11-24 DIAGNOSIS — R4789 Other speech disturbances: Secondary | ICD-10-CM | POA: Insufficient documentation

## 2012-11-24 DIAGNOSIS — Z888 Allergy status to other drugs, medicaments and biological substances status: Secondary | ICD-10-CM | POA: Insufficient documentation

## 2012-11-24 DIAGNOSIS — Z79899 Other long term (current) drug therapy: Secondary | ICD-10-CM | POA: Insufficient documentation

## 2012-11-24 DIAGNOSIS — M2550 Pain in unspecified joint: Secondary | ICD-10-CM | POA: Insufficient documentation

## 2012-11-24 DIAGNOSIS — Z8669 Personal history of other diseases of the nervous system and sense organs: Secondary | ICD-10-CM | POA: Insufficient documentation

## 2012-11-24 DIAGNOSIS — E785 Hyperlipidemia, unspecified: Secondary | ICD-10-CM | POA: Insufficient documentation

## 2012-11-24 DIAGNOSIS — E781 Pure hyperglyceridemia: Secondary | ICD-10-CM | POA: Insufficient documentation

## 2012-11-24 DIAGNOSIS — R000 Tachycardia, unspecified: Secondary | ICD-10-CM | POA: Insufficient documentation

## 2012-11-24 DIAGNOSIS — Z87891 Personal history of nicotine dependence: Secondary | ICD-10-CM | POA: Insufficient documentation

## 2012-11-24 DIAGNOSIS — E559 Vitamin D deficiency, unspecified: Secondary | ICD-10-CM | POA: Insufficient documentation

## 2012-11-24 DIAGNOSIS — F1010 Alcohol abuse, uncomplicated: Secondary | ICD-10-CM | POA: Insufficient documentation

## 2012-11-24 DIAGNOSIS — N951 Menopausal and female climacteric states: Secondary | ICD-10-CM | POA: Insufficient documentation

## 2012-11-24 DIAGNOSIS — Z8701 Personal history of pneumonia (recurrent): Secondary | ICD-10-CM | POA: Insufficient documentation

## 2012-11-24 LAB — COMPREHENSIVE METABOLIC PANEL
ALT: 25 U/L (ref 0–35)
BUN: 16 mg/dL (ref 6–23)
Calcium: 7.5 mg/dL — ABNORMAL LOW (ref 8.4–10.5)
GFR calc Af Amer: 51 mL/min — ABNORMAL LOW (ref >90)
Glucose, Bld: 118 mg/dL — ABNORMAL HIGH (ref 70–99)
Sodium: 133 mEq/L — ABNORMAL LOW (ref 135–145)
Total Protein: 10.4 g/dL — ABNORMAL HIGH (ref 6.0–8.3)

## 2012-11-24 LAB — CBC WITH DIFFERENTIAL/PLATELET
HCT: 31 % — ABNORMAL LOW (ref 36.0–46.0)
Hemoglobin: 10.2 g/dL — ABNORMAL LOW (ref 12.0–15.0)
Lymphocytes Relative: 33 % (ref 12–46)
Lymphs Abs: 2.9 10*3/uL (ref 0.7–4.0)
Monocytes Absolute: 1.1 10*3/uL — ABNORMAL HIGH (ref 0.1–1.0)
Monocytes Relative: 13 % — ABNORMAL HIGH (ref 3–12)
Neutro Abs: 4.4 10*3/uL (ref 1.7–7.7)
RBC: 2.97 MIL/uL — ABNORMAL LOW (ref 3.87–5.11)
WBC: 8.7 10*3/uL (ref 4.0–10.5)

## 2012-11-24 MED ORDER — LORAZEPAM 2 MG/ML IJ SOLN: 1.0000 mg | Freq: Four times a day (QID) | INTRAMUSCULAR | Status: DC | PRN

## 2012-11-24 MED ORDER — FOLIC ACID 1 MG PO TABS
1.0000 mg | ORAL_TABLET | Freq: Every day | ORAL | Status: DC
  Administered 2012-11-25 – 2012-11-26 (×3): 1 mg via ORAL
  Filled 2012-11-24 (×3): qty 1

## 2012-11-24 MED ORDER — ADULT MULTIVITAMIN W/MINERALS CH
1.0000 | ORAL_TABLET | Freq: Every day | ORAL | Status: DC
  Administered 2012-11-25 – 2012-11-26 (×3): 1 via ORAL
  Filled 2012-11-24 (×3): qty 1

## 2012-11-24 MED ORDER — LORAZEPAM 1 MG PO TABS
0.0000 mg | ORAL_TABLET | Freq: Four times a day (QID) | ORAL | Status: DC
  Administered 2012-11-25: 2 mg via ORAL
  Administered 2012-11-26: 1 mg via ORAL
  Filled 2012-11-24: qty 1
  Filled 2012-11-24: qty 2

## 2012-11-24 MED ORDER — LORAZEPAM 1 MG PO TABS: 1.0000 mg | ORAL_TABLET | Freq: Four times a day (QID) | ORAL | Status: DC | PRN

## 2012-11-24 MED ORDER — THIAMINE HCL 100 MG/ML IJ SOLN: 100.0000 mg | Freq: Every day | INTRAMUSCULAR | Status: DC

## 2012-11-24 MED ORDER — VITAMIN B-1 100 MG PO TABS
100.0000 mg | ORAL_TABLET | Freq: Every day | ORAL | Status: DC
  Administered 2012-11-25 – 2012-11-26 (×3): 100 mg via ORAL
  Filled 2012-11-24 (×3): qty 1

## 2012-11-24 MED ORDER — LORAZEPAM 1 MG PO TABS: 0.0000 mg | ORAL_TABLET | Freq: Two times a day (BID) | ORAL | Status: DC

## 2012-11-24 NOTE — ED Notes (Signed)
Pt recently left rehab for alcoholism one week ago, she reports using ETOH everyday since leaving rehab. Reports diarrhea x4 in one hour, and feeling dizzy. Pt is alert with slurred speech and smell of ETOH. Admits to ETOH use today. denies pain. Denies blood in stool reports dark black stool.

## 2012-11-24 NOTE — ED Provider Notes (Signed)
History     CSN: [id number]  Arrival date & time 11/24/12  1723   First MD Initiated Contact with Patient 11/24/12 1934      Chief Complaint  Patient presents with  . Diarrhea    (Consider location/radiation/quality/duration/timing/severity/associated sxs/prior treatment) HPI Comments: 57 year old female with a past medical history of alcohol abuse and pancreatitis among multiple other medical problems presents to the emergency department with her daughter complaining of diarrhea x2 days. Patient was recently released from behavioral health due to alcohol abuse, began drinking 2 days after returning home and developed diarrhea 2 days ago. Denies abdominal pain, nausea or vomiting. No bloody stools. States she has had one normal bowel movement since the diarrhea started. Last alcohol intake was today, did not mention a certain amount of alcohol she drank. Daughter states she drinks "too much". She has summary states patient felt dizzy, however patient denies this on my examination. Denies appetite change, fever or chills. Patient is supposed to be going to [facility] for detox, however this has not yet happened.  Patient is a 57 y.o. female presenting with diarrhea. The history is provided by the patient and a relative.  Diarrhea Associated symptoms: no abdominal pain, no chills, no fever, no headaches and no vomiting     Past Medical History  Diagnosis Date  . Anemia, B12 deficiency   . History of acute pancreatitis   . Right knee pain     No recent imaging on chart  . 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 Dr. Su Hilt, [facility]  . Hypertriglyceridemia   . GERD (gastroesophageal reflux disease)   . Vitamin D deficiency   . Subdural hematoma 02/2008    Likely 2/2 trauma from seizure from EtOH withdrawal, chronic in nature, sees Dr. Robyne Askew. Most recent CT head 10/2009 showing stable but persistent hematoma without mass  effect.  . History of seizure disorder     Likely 2/2 alcohol abuse  . Hypocalcemia   . Hypomagnesemia   . Failure to thrive in childhood     Unclear etiology  . HTN (hypertension)   . Thrombocytopenia   . Anemia, macrocytic   . Hepatomegaly     On exam  . Alcohol abuse   . Joint pain   . Alcohol abuse   . Arthritis   . Vitamin D deficiency   . Menopause   . Pancreatitis   . Insomnia   . Hyperlipidemia   . Sinusitis   . Pernicious anemia   . Subdural hematoma   . Macrocytic anemia   . Hepatomegaly   . 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 Seizures     1.5 YRS  LAST ONE  . Depression   . Hepatitis     HEPATOMEGALY   . Fx humeral neck 04/17/2011    Transverse fracture- minimally displaced- managed as outpatient   . ABNORMAL PAP SMEAR, LGSIL 07/23/2008    Annotation: HPV positive CIN I Dr. Su Hilt, [facility] Qualifier: Diagnosis of  By: Danae Chen    . Pneumonia 05/20/2012    Past Surgical History  Procedure Laterality Date  . Cesarean section  1983  . Esophagogastroduodenoscopy  07/11/2011    Procedure: ESOPHAGOGASTRODUODENOSCOPY (EGD);  Surgeon: Theda Belfast, MD;  Location: [facility];  Service: Endoscopy;  Laterality: N/A;  . Colonoscopy  07/11/2011    Procedure: COLONOSCOPY;  Surgeon: Theda Belfast, MD;  Location:  [facility];  Service: Endoscopy;  Laterality: N/A;  . Eye surgery      LEFT EYE YRS AGO   . Rt colectomy  08/28/2011    Family History  Problem Relation Age of Onset  . Cancer Mother     Died from stomach cancer and "flesh eating rash  . Heart failure Father     Died in 47s from an MI  . Alcohol abuse Sister     Twin sister drinks a lot, as did both her parents and brothers  . Stroke Brother     Has 7 brothers, 1 with CVA    History  Substance Use Topics  . Smoking status: Former Smoker    Types: Cigarettes    Quit date: 09/20/2010  . Smokeless tobacco: Never Used  . Alcohol Use: 6.0 oz/week    10 Glasses of  wine per week     Comment: No alocohol x 3 weeks    OB History   Grav Para Term Preterm Abortions TAB SAB Ect Mult Living                  Review of Systems  Constitutional: Negative for fever and chills.  Respiratory: Negative for shortness of breath.   Cardiovascular: Negative for chest pain.  Gastrointestinal: Positive for diarrhea. Negative for nausea, vomiting, abdominal pain and blood in stool.  Musculoskeletal: Negative for back pain.  Neurological: Negative for dizziness, light-headedness and headaches.  Psychiatric/Behavioral: Negative for confusion.  All other systems reviewed and are negative.    Allergies  Amitriptyline hcl and Doxycycline hyclate  Home Medications   Current Outpatient Rx  Name  Route  Sig  Dispense  Refill  . amLODipine (NORVASC) 10 MG tablet   Oral   Take 0.5 tablets (5 mg total) by mouth daily. For hypertension   30 tablet   3   . amylase-lipase-protease (PANGESTYME EC) 20-4.5-25 MU per capsule   Oral   Take 1 capsule by mouth 3 (three) times daily with meals. For low pancreatic function   90 capsule   4   . calcium carbonate (TUMS) 500 MG chewable tablet   Oral   Chew 6 tablets (1,200 mg of elemental calcium total) by mouth 3 (three) times daily. For bone health   90 tablet   3   . ferrous sulfate 325 (65 FE) MG tablet   Oral   Take 1 tablet (325 mg total) by mouth 2 (two) times daily with a meal. (May purchase from over the counter): For low Iron      3   . FLUoxetine (PROZAC) 10 MG capsule   Oral   Take 1 capsule (10 mg total) by mouth daily. For depression   30 capsule   0   . folic acid (FOLVITE) 1 MG tablet   Oral   Take 1 tablet (1 mg total) by mouth daily. For folic acid replacement   30 tablet   1   . furosemide (LASIX) 20 MG tablet   Oral   Take 1 tablet (20 mg total) by mouth daily.   30 tablet   1   . gabapentin (NEURONTIN) 300 MG capsule   Oral   Take 2 capsules (600 mg total) by mouth 3 (three)  times daily. For anxiety/pain control   90 capsule   2   . hydrochlorothiazide (HYDRODIURIL) 25 MG tablet   Oral   Take 1 tablet (25 mg total) by mouth daily. For high blood pressure control  30 tablet   3   . levETIRAcetam (KEPPRA) 500 MG tablet   Oral   Take 1 tablet (500 mg total) by mouth 2 (two) times daily. For seizure activities         . Multiple Vitamin (MULTIVITAMIN WITH MINERALS) TABS   Oral   Take 1 tablet by mouth daily. For vitamin replacement   30 tablet   11   . omeprazole (PRILOSEC) 40 MG capsule   Oral   Take 1 capsule (40 mg total) by mouth daily.   30 capsule   3   . potassium chloride SA (KLOR-CON M20) 20 MEQ tablet   Oral   Take 1 tablet (20 mEq total) by mouth daily. For low potassium         . thiamine (VITAMIN B-1) 100 MG tablet   Oral   Take 1 tablet (100 mg total) by mouth daily. For low thiamine   90 tablet   3   . vitamin B-12 (CYANOCOBALAMIN) 250 MCG tablet   Oral   Take 1 tablet (250 mcg total) by mouth daily. For low B-12         . Vitamin D, Ergocalciferol, (DRISDOL) 50000 UNITS CAPS   Oral   Take 1 capsule (50,000 Units total) by mouth every 7 (seven) days. Thursdays: For bone health   30 capsule      . VOLTAREN 1 % GEL                 BP 122/82  Pulse 102  Temp(Src) 98.7 F (37.1 C) (Oral)  Resp 20  SpO2 98%  Physical Exam  Nursing note and vitals reviewed. Constitutional: She is oriented to person, place, and time. She appears well-developed and well-nourished. No distress.  Smells of ETOH.  HENT:  Head: Normocephalic and atraumatic.  Mouth/Throat: Oropharynx is clear and moist.  Eyes: Conjunctivae and EOM are normal. Pupils are equal, round, and reactive to light. No scleral icterus.  Neck: Normal range of motion. Neck supple.  Cardiovascular: Regular rhythm, normal heart sounds and intact distal pulses.  Tachycardia present.   Pulmonary/Chest: Effort normal and breath sounds normal. No respiratory  distress.  Abdominal: Soft. Bowel sounds are normal. She exhibits no distension and no mass. There is no tenderness. There is no rebound and no guarding.  Musculoskeletal: Normal range of motion. She exhibits no edema.  Neurological: She is alert and oriented to person, place, and time.  Skin: Skin is warm and dry. She is not diaphoretic.  Psychiatric: She has a normal mood and affect. Her behavior is normal. Thought content normal. Her speech is slurred.    ED Course  Procedures (including critical care time)   Date: 11/24/2012  Rate: 88  Rhythm: normal sinus rhythm  QRS Axis: right  Intervals: normal  ST/T Wave abnormalities: normal  Conduction Disutrbances:none  Narrative Interpretation: no stemi, RAD, no peaked T-waves  Old EKG Reviewed: changes noted RAD   Labs Reviewed  COMPREHENSIVE METABOLIC PANEL - Abnormal; Notable for the following:    Sodium 133 (*)    Potassium 6.2 (*)    CO2 12 (*)    Glucose, Bld 118 (*)    Creatinine, Ser 1.34 (*)    Calcium 7.5 (*)    Total Protein 10.4 (*)    Albumin 2.6 (*)    AST 148 (*)    Alkaline Phosphatase 462 (*)    GFR calc non Af Amer 44 (*)    GFR calc Af Amer 51 (*)  All other components within normal limits  CBC WITH DIFFERENTIAL - Abnormal; Notable for the following:    RBC 2.97 (*)    Hemoglobin 10.2 (*)    HCT 31.0 (*)    MCV 104.4 (*)    MCH 34.3 (*)    Monocytes Relative 13 (*)    Monocytes Absolute 1.1 (*)    All other components within normal limits  LIPASE, BLOOD  POTASSIUM   No results found.   1. Diarrhea   2. Alcohol abuse       MDM  Diarrhea, alcohol intoxication. Daughter states patient needs help. Patient in NAD. Initial blood work obtained in triage hemolyzed, K 6.2. Will obtain repeat potassium along with psych labs, CIWA holding protocol. Consult to ACT team. 8:34 PM Repeat K 3.8. 8:56 PM Patient will be moved to pod C. Awaiting ACT team consult. 9:54 PM Spoke with Tammy Sours with ACT team  who will evaluate patient. 11:29 PM Patient evaluated by Tammy Sours with ACT team, feels as if patient would benefit from detoxing in ED overnight, re-assess in the morning.  Trevor Mace, PA-C 11/26/12 [phone]

## 2012-11-24 NOTE — BH Assessment (Signed)
Assessment Note   [name] [name] is an 57 y.o. female. Pt presents to [facility] for alcohol use/abuse.  Pt was detoxed at [facility] earlier this month and reports she began drinking upon her discharge.  Pt was unable to give a very good history, said several times she had been discharged from [facility] 6 weeks ago (intake note in epic is 11/06/12).  Pt's report finally was she is drinking every 3rd day, 2-4 glasses of wine.  Pt told intake RN at [facility] she was drinking daily.  Pt BAC is over 200. Pt denies drug use.  UDS not back yet.   Pt does not report withdrawals but vitals are elevated already and pt still has alcohol on board.  PT denies psychiatric complaint at this time.  Reports she is stressed due to several deaths in her family.  Pt denies SI/HI/AV.  Pt reports she did follow up with her outpt after being discharged from [facility] and began treatment at [facility] last week, she is supposed to be back for treatment tomorrow, Monday, 6/23 at 10 am.    Axis I: alcohol dependence Axis II: Deferred Axis III:  Past Medical History  Diagnosis Date  . Anemia, B12 deficiency   . History of acute pancreatitis   . Right knee pain     No recent imaging on chart  . 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 Dr. Su Hilt, [facility]  . Hypertriglyceridemia   . GERD (gastroesophageal reflux disease)   . Vitamin D deficiency   . Subdural hematoma 02/2008    Likely 2/2 trauma from seizure from EtOH withdrawal, chronic in nature, sees Dr. Robyne Askew. Most recent CT head 10/2009 showing stable but persistent hematoma without mass effect.  . History of seizure disorder     Likely 2/2 alcohol abuse  . Hypocalcemia   . Hypomagnesemia   . Failure to thrive in childhood     Unclear etiology  . HTN (hypertension)   . Thrombocytopenia   . Anemia, macrocytic   . Hepatomegaly     On exam  . Alcohol abuse   . Joint pain   . Alcohol abuse   .  Arthritis   . Vitamin D deficiency   . Menopause   . Pancreatitis   . Insomnia   . Hyperlipidemia   . Sinusitis   . Pernicious anemia   . Subdural hematoma   . Macrocytic anemia   . Hepatomegaly   . 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 Seizures     1.5 YRS  LAST ONE  . Depression   . Hepatitis     HEPATOMEGALY   . Fx humeral neck 04/17/2011    Transverse fracture- minimally displaced- managed as outpatient   . ABNORMAL PAP SMEAR, LGSIL 07/23/2008    Annotation: HPV positive CIN I Dr. Su Hilt, [facility] Qualifier: Diagnosis of  By: Danae Chen    . Pneumonia 05/20/2012   Axis IV: recent deaths in family Axis V: 51-60 moderate symptoms  Past Medical History:  Past Medical History  Diagnosis Date  . Anemia, B12 deficiency   . History of acute pancreatitis   . Right knee pain     No recent imaging on chart  . 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 Dr. Su Hilt, [facility]  . Hypertriglyceridemia   .  GERD (gastroesophageal reflux disease)   . Vitamin D deficiency   . Subdural hematoma 02/2008    Likely 2/2 trauma from seizure from EtOH withdrawal, chronic in nature, sees Dr. Robyne Askew. Most recent CT head 10/2009 showing stable but persistent hematoma without mass effect.  . History of seizure disorder     Likely 2/2 alcohol abuse  . Hypocalcemia   . Hypomagnesemia   . Failure to thrive in childhood     Unclear etiology  . HTN (hypertension)   . Thrombocytopenia   . Anemia, macrocytic   . Hepatomegaly     On exam  . Alcohol abuse   . Joint pain   . Alcohol abuse   . Arthritis   . Vitamin D deficiency   . Menopause   . Pancreatitis   . Insomnia   . Hyperlipidemia   . Sinusitis   . Pernicious anemia   . Subdural hematoma   . Macrocytic anemia   . Hepatomegaly   . 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 Seizures     1.5 YRS  LAST ONE  . Depression   . Hepatitis      HEPATOMEGALY   . Fx humeral neck 04/17/2011    Transverse fracture- minimally displaced- managed as outpatient   . ABNORMAL PAP SMEAR, LGSIL 07/23/2008    Annotation: HPV positive CIN I Dr. Su Hilt, [facility] Qualifier: Diagnosis of  By: Danae Chen    . Pneumonia 05/20/2012    Past Surgical History  Procedure Laterality Date  . Cesarean section  1983  . Esophagogastroduodenoscopy  07/11/2011    Procedure: ESOPHAGOGASTRODUODENOSCOPY (EGD);  Surgeon: Theda Belfast, MD;  Location: [facility];  Service: Endoscopy;  Laterality: N/A;  . Colonoscopy  07/11/2011    Procedure: COLONOSCOPY;  Surgeon: Theda Belfast, MD;  Location: [facility];  Service: Endoscopy;  Laterality: N/A;  . Eye surgery      LEFT EYE YRS AGO   . Rt colectomy  08/28/2011    Family History:  Family History  Problem Relation Age of Onset  . Cancer Mother     Died from stomach cancer and "flesh eating rash  . Heart failure Father     Died in 65s from an MI  . Alcohol abuse Sister     Twin sister drinks a lot, as did both her parents and brothers  . Stroke Brother     Has 7 brothers, 1 with CVA    Social History:  reports that she quit smoking about 2 years ago. Her smoking use included Cigarettes. She smoked 0.00 packs per day. She has never used smokeless tobacco. She reports that she drinks about 6.0 ounces of alcohol per week. She reports that she does not use illicit drugs.  Additional Social History:  Alcohol / Drug Use Pain Medications: Pt denies Prescriptions: pt denies Over the Counter: pt denies History of alcohol / drug use?: Yes Negative Consequences of Use: Legal Withdrawal Symptoms: Seizures Onset of Seizures: 2 years ago Date of most recent seizure: 18 months ago Substance #1 Name of Substance 1: wine 1 - Age of First Use: 36 1 - Amount (size/oz): 2-4 glasses 1 - Frequency: every 3 days 1 - Duration: since leaving [facility] 2 weeks ago 1 - Last Use / Amount: 6/22, 2  glasses wine  CIWA: CIWA-Ar BP: 147/99 mmHg Pulse Rate: 94 Nausea and Vomiting: no nausea and no vomiting Tactile Disturbances: none Tremor: no tremor Auditory Disturbances:  not present Paroxysmal Sweats: no sweat visible Visual Disturbances: not present Anxiety: no anxiety, at ease Headache, Fullness in Head: none present Agitation: normal activity Orientation and Clouding of Sensorium: oriented and can do serial additions CIWA-Ar Total: 0 COWS:    Allergies:  Allergies  Allergen Reactions  . Amitriptyline Hcl Swelling    In the face.  . Doxycycline Hyclate Itching    Feels like something crawling under her skin    Home Medications:  (Not in a hospital admission)  OB/GYN Status:  No LMP recorded. Patient is postmenopausal.  General Assessment Data Location of Assessment: [facility] ED ACT Assessment: Yes Living Arrangements: Spouse/significant other;Children Can pt return to current living arrangement?: Yes     Risk to self Suicidal Ideation: No Suicidal Intent: No Is patient at risk for suicide?: No Suicidal Plan?: No Access to Means: No What has been your use of drugs/alcohol within the last 12 months?: current alcohol use Previous Attempts/Gestures: No Intentional Self Injurious Behavior: None Family Suicide History: No Recent stressful life event(s): Loss (Comment) (several family members have died recently) Persecutory voices/beliefs?: No Depression: No Substance abuse history and/or treatment for substance abuse?: Yes Suicide prevention information given to non-admitted patients: Yes  Risk to Others Homicidal Ideation: No Thoughts of Harm to Others: No Current Homicidal Intent: No Current Homicidal Plan: No Access to Homicidal Means: No History of harm to others?: No Assessment of Violence: None Noted Does patient have access to weapons?: Yes (Comment) (husband has locked guns) Criminal Charges Pending?: Yes Describe Pending Criminal Charges: DUI Does  patient have a court date: Yes Court Date:  (august 2014)  Psychosis Hallucinations: None noted Delusions: None noted  Mental Status Report Appear/Hygiene: Disheveled Eye Contact: Good Motor Activity: Unremarkable Speech: Logical/coherent Level of Consciousness: Alert Mood: Other (Comment) (pleasant) Affect: Appropriate to circumstance Anxiety Level: None Thought Processes: Coherent;Relevant Judgement: Unimpaired Orientation: Person;Place;Time;Situation Obsessive Compulsive Thoughts/Behaviors: None  Cognitive Functioning Concentration: Normal Memory: Recent Intact;Remote Intact IQ: Average Insight: Fair Impulse Control: Poor Appetite: Fair Weight Loss: 0 Weight Gain: 7 Sleep: No Change Total Hours of Sleep: 7 Vegetative Symptoms: None  ADLScreening [facility]) Patient's cognitive ability adequate to safely complete daily activities?: Yes Patient able to express need for assistance with ADLs?: Yes Independently performs ADLs?: Yes (appropriate for developmental age)  Abuse/Neglect [facility]) Physical Abuse: Denies Verbal Abuse: Denies Sexual Abuse: Denies  Prior Inpatient Therapy Prior Inpatient Therapy: Yes (also [facility] treatment 09/2012) Prior Therapy Dates: 11/2012 Prior Therapy Facilty/Provider(s): [facility] Reason for Treatment: detox  Prior Outpatient Therapy Prior Outpatient Therapy: Yes Prior Therapy Dates: current Prior Therapy Facilty/Provider(s): [facility] Reason for Treatment: alcohol  ADL Screening (condition at time of admission) Patient's cognitive ability adequate to safely complete daily activities?: Yes Patient able to express need for assistance with ADLs?: Yes Independently performs ADLs?: Yes (appropriate for developmental age)  Home Assistive Devices/Equipment Home Assistive Devices/Equipment: None    Abuse/Neglect Assessment (Assessment to be complete while patient is alone) Physical Abuse: Denies Verbal Abuse:  Denies Sexual Abuse: Denies Exploitation of patient/patient's resources: Denies Self-Neglect: Denies Values / Beliefs Cultural Requests During Hospitalization: None Spiritual Requests During Hospitalization: None   Advance Directives (For Healthcare) Advance Directive: Patient does not have advance directive;Patient would not like information (pt reports she is "working on" living will)    Additional Information 1:1 In Past 12 Months?: No CIRT Risk: No Elopement Risk: No Does patient have medical clearance?: Yes     Disposition: Recommend pt be reevaluated for  withdrawal and need for detox in AM.  Pt vitals are elevated currently and pt still has alcohol on board. Disposition Initial Assessment Completed for this Encounter: Yes  On Site Evaluation by:   Reviewed with Physician:     Lorri Frederick 11/24/2012 11:15 PM

## 2012-11-25 LAB — RAPID URINE DRUG SCREEN, HOSP PERFORMED
Benzodiazepines: POSITIVE — AB
Cocaine: NOT DETECTED
Opiates: NOT DETECTED

## 2012-11-25 NOTE — BH Assessment (Signed)
[facility] Assessment Progress Note      Update:  Pt referral sent to [facility] as per [facility] @ 1316, beds available.  Per Lynnell Catalan @ 1317, no beds currently, but asked writer to send referrral fro consideration for their wait list.  Referral faxed for review.  Per Christiane Ha @ 1320, beds available.  Referral faxed for review.  Per Amt @ [place], no detox pts taken @ 1120.  Referral also faxed to [place], as per [facility] @ 1121, may have discharges there.  Pt also pending [facility].  Writer to continue to pursue placement for pt.

## 2012-11-25 NOTE — BH Assessment (Signed)
Assessment Note   [name] [name] is an 57 y.o. female that was reassessed this day.  Pt continues to request alcohol detox.  Pt was discharged from [facility] earlier this month and reported she has been drinking since discharged.  Pt reports drinking every 3 days and drinking 2-4 glasses of wine. Last drink 11/24/12.  Pt denies SI/HI or psychosis.  Pt denies current withdrawal sx, but appears anxious during reassessment.  Pt is currently in treatment with [facility] and had an appt today scheduled for 1000.  Informed pt writer was still bed finding and referrals sent to several facilities.  However, if pt doesn't get accepted for inpatient detox, she may have to be referred back to current treatment.  [name] [name], and per [name] [name], beds available @ 1316.  Referral faxed for review.  Called [facility] and no beds per [facility] @ 1317, but she told Clinical research associate to fax to consider for their wait list.  Called [facility] and beds per [name] [name] @ 1320.  Referral faxed for review.  Called [facility] and there may be discharges per [name] [name] @ 1121.  Referral faxed for review.  [facility] doesn't take SA, nor does Emergency planning/management officer will continue bed finding for pt.  Completed reassessment and updated ED staff.    Previous Note:  [name] [name] is an 57 y.o. female. Pt presents to [facility] for alcohol use/abuse. Pt was detoxed at [facility] earlier this month and reports she began drinking upon her discharge. Pt was unable to give a very good history, said several times she had been discharged from [facility] 6 weeks ago (intake note in epic is 11/06/12). Pt's report finally was she is drinking every 3rd day, 2-4 glasses of wine. Pt told intake RN at [facility] she was drinking daily. Pt BAC is over 200. Pt denies drug use. UDS not back yet. Pt does not report withdrawals but vitals are elevated already and pt still has alcohol on board. PT denies psychiatric complaint at this time. Reports she is stressed due to several deaths in her family. Pt denies  SI/HI/AV. Pt reports she did follow up with her outpt after being discharged from [facility] and began treatment at [facility] last week, she is supposed to be back for treatment tomorrow, Monday, 6/23 at 10 am.    Axis I: 303.90 Alcohol Dependence Axis II: Deferred Axis III:  Past Medical History  Diagnosis Date  . Anemia, B12 deficiency   . History of acute pancreatitis   . Right knee pain     No recent imaging on chart  . 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 [name] [name], [facility]  . Hypertriglyceridemia   . GERD (gastroesophageal reflux disease)   . Vitamin D deficiency   . Subdural hematoma 02/2008    Likely 2/2 trauma from seizure from EtOH withdrawal, chronic in nature, sees Dr. Robyne Askew. Most recent CT head 10/2009 showing stable but persistent hematoma without mass effect.  . History of seizure disorder     Likely 2/2 alcohol abuse  . Hypocalcemia   . Hypomagnesemia   . Failure to thrive in childhood     Unclear etiology  . HTN (hypertension)   . Thrombocytopenia   . Anemia, macrocytic   . Hepatomegaly     On exam  . Alcohol abuse   . Joint pain   . Alcohol abuse   . Arthritis   . Vitamin  D deficiency   . Menopause   . Pancreatitis   . Insomnia   . Hyperlipidemia   . Sinusitis   . Pernicious anemia   . Subdural hematoma   . Macrocytic anemia   . Hepatomegaly   . 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 Seizures     1.5 YRS  LAST ONE  . Depression   . Hepatitis     HEPATOMEGALY   . Fx humeral neck 04/17/2011    Transverse fracture- minimally displaced- managed as outpatient   . ABNORMAL PAP SMEAR, LGSIL 07/23/2008    Annotation: HPV positive CIN I [name] [name], [facility] Qualifier: Diagnosis of  By: [name] [name]    . Pneumonia 05/20/2012   Axis IV: other psychosocial or environmental problems and problems related to legal system/crime Axis V: 41-50  serious symptoms  Past Medical History:  Past Medical History  Diagnosis Date  . Anemia, B12 deficiency   . History of acute pancreatitis   . Right knee pain     No recent imaging on chart  . 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 [name] [name], [facility]  . Hypertriglyceridemia   . GERD (gastroesophageal reflux disease)   . Vitamin D deficiency   . Subdural hematoma 02/2008    Likely 2/2 trauma from seizure from EtOH withdrawal, chronic in nature, sees Dr. Robyne Askew. Most recent CT head 10/2009 showing stable but persistent hematoma without mass effect.  . History of seizure disorder     Likely 2/2 alcohol abuse  . Hypocalcemia   . Hypomagnesemia   . Failure to thrive in childhood     Unclear etiology  . HTN (hypertension)   . Thrombocytopenia   . Anemia, macrocytic   . Hepatomegaly     On exam  . Alcohol abuse   . Joint pain   . Alcohol abuse   . Arthritis   . Vitamin D deficiency   . Menopause   . Pancreatitis   . Insomnia   . Hyperlipidemia   . Sinusitis   . Pernicious anemia   . Subdural hematoma   . Macrocytic anemia   . Hepatomegaly   . 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 Seizures     1.5 YRS  LAST ONE  . Depression   . Hepatitis     HEPATOMEGALY   . Fx humeral neck 04/17/2011    Transverse fracture- minimally displaced- managed as outpatient   . ABNORMAL PAP SMEAR, LGSIL 07/23/2008    Annotation: HPV positive CIN I [name] [name], [facility] Qualifier: Diagnosis of  By: [name] [name]    . Pneumonia 05/20/2012    Past Surgical History  Procedure Laterality Date  . Cesarean section  1983  . Esophagogastroduodenoscopy  07/11/2011    Procedure: ESOPHAGOGASTRODUODENOSCOPY (EGD);  Surgeon: Theda Belfast, MD;  Location: [facility];  Service: Endoscopy;  Laterality: N/A;  . Colonoscopy  07/11/2011    Procedure: COLONOSCOPY;  Surgeon: Theda Belfast, MD;  Location: [facility]  ENDOSCOPY;  Service: Endoscopy;  Laterality: N/A;  . Eye surgery      LEFT EYE YRS AGO   . Rt colectomy  08/28/2011    Family History:  Family History  Problem Relation Age of Onset  . Cancer Mother     Died from stomach cancer and "flesh eating rash  . Heart failure Father  Died in 73s from an MI  . Alcohol abuse Sister     Twin sister drinks a lot, as did both her parents and brothers  . Stroke Brother     Has 7 brothers, 1 with CVA    Social History:  reports that she quit smoking about 2 years ago. Her smoking use included Cigarettes. She smoked 0.00 packs per day. She has never used smokeless tobacco. She reports that she drinks about 6.0 ounces of alcohol per week. She reports that she does not use illicit drugs.  Additional Social History:  Alcohol / Drug Use Pain Medications: Pt denies Prescriptions: pt denies Over the Counter: pt denies History of alcohol / drug use?: Yes Longest period of sobriety (when/how long):  (unknown) Negative Consequences of Use: Legal Withdrawal Symptoms: Seizures Onset of Seizures: 2 years ago Date of most recent seizure: 18 months ago Substance #1 Name of Substance 1: wine 1 - Age of First Use: 36 1 - Amount (size/oz): 2-4 glasses 1 - Frequency: every 3 days 1 - Duration: since leaving [facility] 2 weeks ago 1 - Last Use / Amount: 6/22, 2 glasses wine  CIWA: CIWA-Ar BP: 134/85 mmHg Pulse Rate: 88 Nausea and Vomiting: no nausea and no vomiting Tactile Disturbances: none Tremor: no tremor Auditory Disturbances: not present Paroxysmal Sweats: no sweat visible Visual Disturbances: not present Anxiety: no anxiety, at ease Headache, Fullness in Head: none present Agitation: normal activity Orientation and Clouding of Sensorium: oriented and can do serial additions CIWA-Ar Total: 0 COWS:    Allergies:  Allergies  Allergen Reactions  . Amitriptyline Hcl Swelling    In the face.  . Doxycycline Hyclate Itching    Feels like something  crawling under her skin    Home Medications:  (Not in a hospital admission)  OB/GYN Status:  No LMP recorded. Patient is postmenopausal.  General Assessment Data Location of Assessment: [facility] ED ACT Assessment: Yes Living Arrangements: Spouse/significant other;Children Can pt return to current living arrangement?: Yes Admission Status: Voluntary Is patient capable of signing voluntary admission?: Yes Transfer from: Acute Hospital Referral Source: Self/Family/Friend  Education Status Is patient currently in school?: No  Risk to self Suicidal Ideation: No Suicidal Intent: No Is patient at risk for suicide?: No Suicidal Plan?: No Access to Means: No What has been your use of drugs/alcohol within the last 12 months?: Current alcohol use Previous Attempts/Gestures: No How many times?: 0 Other Self Harm Risks: pt denies Triggers for Past Attempts: Other (Comment) (no previous attempts or gestures per pt report) Intentional Self Injurious Behavior: None Family Suicide History: No Recent stressful life event(s): Loss (Comment) (several family members have died recently) Persecutory voices/beliefs?: No Depression: No Depression Symptoms:  (pt denies) Substance abuse history and/or treatment for substance abuse?: Yes Suicide prevention information given to non-admitted patients: Yes  Risk to Others Homicidal Ideation: No Thoughts of Harm to Others: No Current Homicidal Intent: No Current Homicidal Plan: No Access to Homicidal Means: No Identified Victim: pt denies History of harm to others?: No Assessment of Violence: None Noted Violent Behavior Description: na - pt calm,cooperative Does patient have access to weapons?: Yes (Comment) (husband has locked guns) Criminal Charges Pending?: Yes Describe Pending Criminal Charges: DUI Does patient have a court date: Yes Court Date:  (August 2014)  Psychosis Hallucinations: None noted Delusions: None noted  Mental Status  Report Appear/Hygiene: Disheveled Eye Contact: Fair Motor Activity: Freedom of movement;Unremarkable Speech: Logical/coherent Level of Consciousness: Alert Mood: Anxious Affect: Appropriate to circumstance  Anxiety Level: Minimal Thought Processes: Coherent;Relevant Judgement: Unimpaired Orientation: Person;Place;Time;Situation Obsessive Compulsive Thoughts/Behaviors: None  Cognitive Functioning Concentration: Normal Memory: Recent Intact;Remote Intact IQ: Average Insight: Fair Impulse Control: Poor Appetite: Fair Weight Loss: 0 Weight Gain: 7 Sleep: No Change Total Hours of Sleep: 7 Vegetative Symptoms: None  ADLScreening [facility]) Patient's cognitive ability adequate to safely complete daily activities?: Yes Patient able to express need for assistance with ADLs?: Yes Independently performs ADLs?: Yes (appropriate for developmental age)  Abuse/Neglect [facility]) Physical Abuse: Denies Verbal Abuse: Denies Sexual Abuse: Denies  Prior Inpatient Therapy Prior Inpatient Therapy:  (Also, treatment at [facility] 09/2012) Prior Therapy Dates: 11/2012 Prior Therapy Facilty/Provider(s): [facility] Reason for Treatment: detox  Prior Outpatient Therapy Prior Outpatient Therapy: Yes Prior Therapy Dates: current Prior Therapy Facilty/Provider(s): [facility] Reason for Treatment: alcohol  ADL Screening (condition at time of admission) Patient's cognitive ability adequate to safely complete daily activities?: Yes Patient able to express need for assistance with ADLs?: Yes Independently performs ADLs?: Yes (appropriate for developmental age)  Home Assistive Devices/Equipment Home Assistive Devices/Equipment: None    Abuse/Neglect Assessment (Assessment to be complete while patient is alone) Physical Abuse: Denies Verbal Abuse: Denies Sexual Abuse: Denies Exploitation of patient/patient's resources: Denies Self-Neglect: Denies Values / Beliefs Cultural  Requests During Hospitalization: None Spiritual Requests During Hospitalization: None Consults Spiritual Care Consult Needed: No Social Work Consult Needed: No Merchant navy officer (For Healthcare) Advance Directive: Patient does not have advance directive;Patient would not like information    Additional Information 1:1 In Past 12 Months?: No CIRT Risk: No Elopement Risk: No Does patient have medical clearance?: Yes     Disposition:  Disposition Initial Assessment Completed for this Encounter: Yes Disposition of Patient: Referred to;Inpatient treatment program Type of inpatient treatment program: Adult Patient referred to: Other (Comment) (Pending [facility], [facility], [facility], [facility])  On Site Evaluation by:   Reviewed with Physician:  Mitchell Heir, Rennis Harding 11/25/2012 5:18 PM

## 2012-11-26 ENCOUNTER — Inpatient Hospital Stay (HOSPITAL_COMMUNITY)
Admission: AD | Admit: 2012-11-26 | Discharge: 2012-11-30 | DRG: 897 | Disposition: A | Discharge status: Still In Hospital | Payer: 59 | Source: Intra-hospital | Attending: Emergency Medicine | Admitting: Emergency Medicine

## 2012-11-26 ENCOUNTER — Encounter (HOSPITAL_COMMUNITY): Payer: Self-pay | Admitting: *Deleted

## 2012-11-26 DIAGNOSIS — K861 Other chronic pancreatitis: Secondary | ICD-10-CM | POA: Diagnosis present

## 2012-11-26 DIAGNOSIS — I10 Essential (primary) hypertension: Secondary | ICD-10-CM | POA: Diagnosis present

## 2012-11-26 DIAGNOSIS — F3289 Other specified depressive episodes: Secondary | ICD-10-CM

## 2012-11-26 DIAGNOSIS — G40909 Epilepsy, unspecified, not intractable, without status epilepticus: Secondary | ICD-10-CM

## 2012-11-26 DIAGNOSIS — F329 Major depressive disorder, single episode, unspecified: Secondary | ICD-10-CM

## 2012-11-26 DIAGNOSIS — K922 Gastrointestinal hemorrhage, unspecified: Secondary | ICD-10-CM

## 2012-11-26 DIAGNOSIS — S42212A Unspecified displaced fracture of surgical neck of left humerus, initial encounter for closed fracture: Secondary | ICD-10-CM

## 2012-11-26 DIAGNOSIS — F1010 Alcohol abuse, uncomplicated: Secondary | ICD-10-CM

## 2012-11-26 DIAGNOSIS — N289 Disorder of kidney and ureter, unspecified: Secondary | ICD-10-CM

## 2012-11-26 DIAGNOSIS — A419 Sepsis, unspecified organism: Secondary | ICD-10-CM

## 2012-11-26 DIAGNOSIS — D649 Anemia, unspecified: Secondary | ICD-10-CM

## 2012-11-26 DIAGNOSIS — F1020 Alcohol dependence, uncomplicated: Principal | ICD-10-CM

## 2012-11-26 DIAGNOSIS — R000 Tachycardia, unspecified: Secondary | ICD-10-CM

## 2012-11-26 DIAGNOSIS — J189 Pneumonia, unspecified organism: Secondary | ICD-10-CM

## 2012-11-26 DIAGNOSIS — K219 Gastro-esophageal reflux disease without esophagitis: Secondary | ICD-10-CM | POA: Diagnosis present

## 2012-11-26 LAB — POTASSIUM: Potassium: 4.2 mEq/L (ref 3.5–5.1)

## 2012-11-26 MED ORDER — ONDANSETRON 4 MG PO TBDP: 4.0000 mg | ORAL_TABLET | Freq: Four times a day (QID) | ORAL | Status: AC | PRN

## 2012-11-26 MED ORDER — LOPERAMIDE HCL 2 MG PO CAPS
2.0000 mg | ORAL_CAPSULE | ORAL | Status: AC | PRN
  Administered 2012-11-27: 2 mg via ORAL
  Administered 2012-11-27: 4 mg via ORAL

## 2012-11-26 MED ORDER — MAGNESIUM HYDROXIDE 400 MG/5ML PO SUSP: 30.0000 mL | Freq: Every day | ORAL | Status: DC | PRN

## 2012-11-26 MED ORDER — CHLORDIAZEPOXIDE HCL 25 MG PO CAPS
25.0000 mg | ORAL_CAPSULE | Freq: Four times a day (QID) | ORAL | Status: AC | PRN
  Administered 2012-11-28: 25 mg via ORAL
  Filled 2012-11-26 (×2): qty 1

## 2012-11-26 MED ORDER — ADULT MULTIVITAMIN W/MINERALS CH
1.0000 | ORAL_TABLET | Freq: Every day | ORAL | Status: DC
  Administered 2012-11-27 – 2012-11-29 (×3): 1 via ORAL
  Filled 2012-11-26 (×9): qty 1

## 2012-11-26 MED ORDER — CHLORDIAZEPOXIDE HCL 25 MG PO CAPS
25.0000 mg | ORAL_CAPSULE | Freq: Once | ORAL | Status: AC
  Administered 2012-11-26: 25 mg via ORAL
  Filled 2012-11-26: qty 1

## 2012-11-26 MED ORDER — TRAZODONE HCL 50 MG PO TABS
50.0000 mg | ORAL_TABLET | Freq: Every evening | ORAL | Status: DC | PRN
  Administered 2012-11-26 – 2012-11-28 (×3): 50 mg via ORAL
  Filled 2012-11-26 (×3): qty 1

## 2012-11-26 MED ORDER — FUROSEMIDE 20 MG PO TABS
20.0000 mg | ORAL_TABLET | Freq: Every day | ORAL | Status: DC
  Administered 2012-11-27 – 2012-11-29 (×3): 20 mg via ORAL
  Filled 2012-11-26 (×8): qty 1

## 2012-11-26 MED ORDER — ADULT MULTIVITAMIN W/MINERALS CH: 1.0000 | ORAL_TABLET | Freq: Every day | ORAL | Status: DC

## 2012-11-26 MED ORDER — ASPIRIN EC 81 MG PO TBEC
81.0000 mg | DELAYED_RELEASE_TABLET | Freq: Every evening | ORAL | Status: DC
  Administered 2012-11-26 – 2012-11-29 (×4): 81 mg via ORAL
  Filled 2012-11-26 (×8): qty 1

## 2012-11-26 MED ORDER — THIAMINE HCL 100 MG/ML IJ SOLN: 100.0000 mg | Freq: Once | INTRAMUSCULAR | Status: DC

## 2012-11-26 MED ORDER — PANCRELIPASE (LIP-PROT-AMYL) 12000-38000 UNITS PO CPEP
1.0000 | ORAL_CAPSULE | Freq: Three times a day (TID) | ORAL | Status: DC
  Administered 2012-11-27 – 2012-11-29 (×8): 1 via ORAL
  Filled 2012-11-26 (×21): qty 1

## 2012-11-26 MED ORDER — VITAMIN D (ERGOCALCIFEROL) 1.25 MG (50000 UNIT) PO CAPS
50000.0000 [IU] | ORAL_CAPSULE | ORAL | Status: DC
  Administered 2012-11-28: 50000 [IU] via ORAL
  Filled 2012-11-26 (×2): qty 1

## 2012-11-26 MED ORDER — HYDROXYZINE HCL 25 MG PO TABS: 25.0000 mg | ORAL_TABLET | Freq: Four times a day (QID) | ORAL | Status: AC | PRN

## 2012-11-26 MED ORDER — ALUM & MAG HYDROXIDE-SIMETH 200-200-20 MG/5ML PO SUSP: 30.0000 mL | ORAL | Status: DC | PRN

## 2012-11-26 MED ORDER — DICLOFENAC SODIUM 1 % TD GEL
1.0000 "application " | Freq: Three times a day (TID) | TRANSDERMAL | Status: DC | PRN
  Filled 2012-11-26: qty 100

## 2012-11-26 MED ORDER — HYDROCHLOROTHIAZIDE 25 MG PO TABS
25.0000 mg | ORAL_TABLET | Freq: Every day | ORAL | Status: DC
  Administered 2012-11-26 – 2012-11-29 (×4): 25 mg via ORAL
  Filled 2012-11-26 (×9): qty 1

## 2012-11-26 MED ORDER — POTASSIUM CHLORIDE CRYS ER 20 MEQ PO TBCR
20.0000 meq | EXTENDED_RELEASE_TABLET | Freq: Every day | ORAL | Status: DC
  Administered 2012-11-26 – 2012-11-29 (×4): 20 meq via ORAL
  Filled 2012-11-26 (×10): qty 1

## 2012-11-26 MED ORDER — CALCIUM CARBONATE ANTACID 500 MG PO CHEW
6.0000 | CHEWABLE_TABLET | Freq: Three times a day (TID) | ORAL | Status: DC
  Administered 2012-11-27 – 2012-11-28 (×6): 1200 mg via ORAL
  Filled 2012-11-26 (×15): qty 6

## 2012-11-26 MED ORDER — CHLORDIAZEPOXIDE HCL 25 MG PO CAPS
25.0000 mg | ORAL_CAPSULE | Freq: Three times a day (TID) | ORAL | Status: AC
  Administered 2012-11-28 (×3): 25 mg via ORAL
  Filled 2012-11-26 (×3): qty 1

## 2012-11-26 MED ORDER — CHLORDIAZEPOXIDE HCL 25 MG PO CAPS: 25.0000 mg | ORAL_CAPSULE | Freq: Every day | ORAL | Status: DC

## 2012-11-26 MED ORDER — FERROUS SULFATE 325 (65 FE) MG PO TABS
325.0000 mg | ORAL_TABLET | Freq: Two times a day (BID) | ORAL | Status: DC
  Administered 2012-11-27 – 2012-11-29 (×6): 325 mg via ORAL
  Filled 2012-11-26 (×15): qty 1

## 2012-11-26 MED ORDER — CHLORDIAZEPOXIDE HCL 25 MG PO CAPS
25.0000 mg | ORAL_CAPSULE | ORAL | Status: AC
  Administered 2012-11-29 (×2): 25 mg via ORAL
  Filled 2012-11-26: qty 1

## 2012-11-26 MED ORDER — VITAMIN B-1 100 MG PO TABS
100.0000 mg | ORAL_TABLET | Freq: Every day | ORAL | Status: DC
  Administered 2012-11-27 – 2012-11-29 (×3): 100 mg via ORAL
  Filled 2012-11-26 (×8): qty 1

## 2012-11-26 MED ORDER — GABAPENTIN 300 MG PO CAPS
600.0000 mg | ORAL_CAPSULE | Freq: Three times a day (TID) | ORAL | Status: DC
  Administered 2012-11-27 – 2012-11-29 (×9): 600 mg via ORAL
  Filled 2012-11-26 (×21): qty 2

## 2012-11-26 MED ORDER — MAGNESIUM OXIDE 400 MG PO TABS
400.0000 mg | ORAL_TABLET | Freq: Three times a day (TID) | ORAL | Status: DC
  Administered 2012-11-27 – 2012-11-29 (×9): 400 mg via ORAL
  Filled 2012-11-26 (×20): qty 1

## 2012-11-26 MED ORDER — FLUOXETINE HCL 10 MG PO CAPS
10.0000 mg | ORAL_CAPSULE | Freq: Every day | ORAL | Status: DC
  Administered 2012-11-26 – 2012-11-29 (×4): 10 mg via ORAL
  Filled 2012-11-26 (×9): qty 1

## 2012-11-26 MED ORDER — FOLIC ACID 1 MG PO TABS
1.0000 mg | ORAL_TABLET | Freq: Every day | ORAL | Status: DC
  Administered 2012-11-27 – 2012-11-29 (×3): 1 mg via ORAL
  Filled 2012-11-26 (×9): qty 1

## 2012-11-26 MED ORDER — PANTOPRAZOLE SODIUM 40 MG PO TBEC
80.0000 mg | DELAYED_RELEASE_TABLET | Freq: Every day | ORAL | Status: DC
  Administered 2012-11-26 – 2012-11-29 (×4): 80 mg via ORAL
  Filled 2012-11-26 (×5): qty 2
  Filled 2012-11-26: qty 1
  Filled 2012-11-26 (×4): qty 2

## 2012-11-26 MED ORDER — VITAMIN B-1 100 MG PO TABS: 100.0000 mg | ORAL_TABLET | Freq: Every day | ORAL | Status: DC

## 2012-11-26 MED ORDER — CHLORDIAZEPOXIDE HCL 25 MG PO CAPS
25.0000 mg | ORAL_CAPSULE | Freq: Four times a day (QID) | ORAL | Status: AC
  Administered 2012-11-26 – 2012-11-27 (×6): 25 mg via ORAL
  Filled 2012-11-26 (×6): qty 1

## 2012-11-26 MED ORDER — AMLODIPINE BESYLATE 5 MG PO TABS
5.0000 mg | ORAL_TABLET | Freq: Every day | ORAL | Status: DC
  Administered 2012-11-26 – 2012-11-29 (×4): 5 mg via ORAL
  Filled 2012-11-26 (×9): qty 1

## 2012-11-26 MED ORDER — ACETAMINOPHEN 325 MG PO TABS: 650.0000 mg | ORAL_TABLET | Freq: Four times a day (QID) | ORAL | Status: DC | PRN

## 2012-11-26 MED ORDER — CYANOCOBALAMIN 250 MCG PO TABS
250.0000 ug | ORAL_TABLET | Freq: Every evening | ORAL | Status: DC
  Administered 2012-11-27 – 2012-11-29 (×3): 250 ug via ORAL
  Filled 2012-11-26 (×8): qty 1

## 2012-11-26 NOTE — ED Provider Notes (Signed)
Medical screening examination/treatment/procedure(s) were performed by non-physician practitioner and as supervising physician I was immediately available for consultation/collaboration.   Shelda Jakes, MD 11/26/12 [phone]

## 2012-11-26 NOTE — BHH Counselor (Signed)
[name] [name], assessment counselor at [facility], submitted Pt for admission to [facility] [facility] Per Lutricia Feil, AC the adult unit is currently at capacity. Donell Sievert, PA reviewed clinical information and accepted Pt to the service of Dr. Geoffery Lyons when a 300-hall bed becomes available.  Harlin Rain Patsy Baltimore, LPC, [facility] Assessment Counselor

## 2012-11-26 NOTE — ED Notes (Signed)
Called Angela Cox, RN back at [facility] and gave new K+ value.  She advised go ahead and send patient.  Contacted security to come take patient.

## 2012-11-26 NOTE — ED Notes (Signed)
Previous RN stated that CIWA was completed at 2300 and was 0

## 2012-11-26 NOTE — BH Assessment (Signed)
Assessment Note  Update: Received call from [facility] stating pt accepted by Donell Sievert, PA to Dr. Dub Mikes to bed 304-1 and that pt could be transported to [facility].  Updated EDP Campos and ED staff.  Completed support paperwork.  ED staff to arrange transport to [facility] via security, as pt is voluntary.  Updated assessment disposition.    Disposition:  Disposition Initial Assessment Completed for this Encounter: Yes Disposition of Patient: Inpatient treatment program Type of inpatient treatment program: Adult Patient referred to: Other (Comment) (Pt accepted [facility])  On Site Evaluation by:   Reviewed with Physician:  Elsie Lincoln, Rennis Harding 11/26/2012 11:19 AM

## 2012-11-26 NOTE — ED Notes (Addendum)
Called report to [place], RN at [facility] [phone]).  Unable to accept patient until a new K+ pulled and resulted.  Will get with MD to get order.  Patient to go to 304-1 once K+ is resulted and patient is accepted.

## 2012-11-26 NOTE — BH Assessment (Signed)
[facility] Assessment Progress Note      Bed ready assigned to room 304-1 to transfer to [facility] after she has had her 10am Ativan.

## 2012-11-26 NOTE — Progress Notes (Signed)
[name], female   DOB: [birth year], 56 y.o.   MRN: [id number] Patient is admitted  ambulatory for "my drinking problem"  Patient says she wants help to stop drinking.  She is calm, cooperative, and denies any SI/HI.  She is a high fall risk.  Her gait is a little unsteady and she says her right leg is weak at times.  Says she uses a cane sometimes at home.  Reviewed fall risk safety plan with patient.  She was able to verbalize prevention measures.  She has a history of multiple medical problems, including arthritis, HTN, anemia, pancreatitis, and seizures  but she denies that anyone ever told her about having hepatitis and says she has not been treated for GERD.  She has a small scab on her L knee from recent fall.  Patient is very thin and says she has lost about 30 pounds over a 4 month period.  She has a decreased appetite. Patient shown around 300 hall and she went immediately into group.

## 2012-11-27 DIAGNOSIS — F1020 Alcohol dependence, uncomplicated: Principal | ICD-10-CM

## 2012-11-27 NOTE — [facility] Group Notes (Signed)
[facility] LCSW Aftercare Discharge Planning Group Note   11/27/2012 9:41 AM  Participation Quality:  Appropriate   Mood/Affect:  Lethargic  Depression Rating:  1  Anxiety Rating:  1  Thoughts of Suicide:  No Will you contract for safety?   NA  Current AVH:  No  Plan for Discharge/Comments:  Pt plans to return home where she lives with a friend and wants to continue to follow-up with [facility] in [place] for therapy/med mgmt.Pt also interested in attending AA meetings.   Transportation Means: friend/bus  Supports: friends and some family  Smart, [place]

## 2012-11-27 NOTE — Progress Notes (Signed)
Recreation Therapy Notes   Date: 06.24.2014  Time: 3:00pm Location: 300 Hall Dayroom  Group Topic/Focus: Scientist, physiological, Communication  Participation Level:  None  Participation Quality:  Observation  Affect:  Euthymic  Cognitive:  Appropriate  Additional Comments: Activity: Life Boat; Explanation: Patients were given the following problem solving scenario: We have chartered a Radio producer for the afternoon. Half way through our trip our boat springs a leak and we need to abandon ship. As a group you must decide which 8 of the following people you are going to save from the sinking ship: President Obama, Devona Konig, ex-marine, ex-convict, teacher, banker, Curator, Personnel officer, pregnant woman, female physician, nurse, priest, rabbi, Anthonette Legato, Investment banker, operational.   Patient had been admitted to unit just prior to group session. Due to patient recently arriving to group session she attended group session, but chose not to participate in group activity.   Marykay Lex Jhovanny Guinta, LRT/CTRS  Aurthur Wingerter L 11/27/2012 8:51 AM

## 2012-11-27 NOTE — Progress Notes (Signed)
Pt reports she is having minimal withdrawal symptoms at this time, but is still having bouts of diarrhea which she attributes to eating ice cream earlier in the day.  Pt says she has been going to groups and participating.  She denies SI/HI/AV.  Pt has been up more today according to prior shift report.  Pt makes her needs known to staff.  She plans either to go to [facility] if a bed is available or return home and go to [employer]  Support and encouragement offered.  Safety maintained with q15 minute checks.

## 2012-11-27 NOTE — Tx Team (Signed)
Interdisciplinary Treatment Plan Update (Adult)  Date: 11/27/2012   Time Reviewed: 10:49 AM  Progress in Treatment:  Attending groups: Yes Participating in groups:  Yes Taking medication as prescribed: Yes  Tolerating medication: Yes  Family/Significant othe contact made: Not required. No SI reported by pt during admission to [facility] or during stay.  Patient understands diagnosis: Yes, AEB seeking treatment for ETOH detox and depression.  Discussing patient identified problems/goals with staff: Yes  Medical problems stabilized or resolved: Yes  Denies suicidal/homicidal ideation: Yes in group.  Patient has not harmed self or Others: Yes  New problem(s) identified: Pt high fall risk. PT ordered to assess pt for cane.  Discharge Plan or Barriers: Pt plans to return home and would like to continue to f/u with [facility] in [place] for med management and therapy. She is also interested in attending AA meetings and will be provided with an AA schedule.  Additional comments: [name] [name] is an 57 y.o. female. Pt presents to [facility] for alcohol use/abuse. Pt was detoxed at [facility] earlier this month and reports she began drinking upon her discharge. Pt was unable to give a very good history, said several times she had been discharged from [facility] 6 weeks ago (intake note in epic is 11/06/12). Pt's report finally was she is drinking every 3rd day, 2-4 glasses of wine. Pt told intake RN at [facility] she was drinking daily. Pt BAC is over 200. Pt denies drug use. UDS not back yet. Pt does not report withdrawals but vitals are elevated already and pt still has alcohol on board. PT denies psychiatric complaint at this time. Reports she is stressed due to several deaths in her family. Pt denies SI/HI/AV. Pt reports she did follow up with her outpt after being discharged from [facility] and began treatment at [facility] last week, she is supposed to be back for treatment tomorrow, Monday, 6/23 at 10 am.   Reason for Continuation of Hospitalization: Detox-Librium taper Medication management Estimated length of stay: 3-5 days  For review of initial/current patient goals, please see plan of care.  Attendees:  Patient:    Family:    Physician: Aggie PA 11/27/2012 10:52 AM   Nursing: Maureen Ralphs RN 11/27/2012 10:52 AM   Clinical Social Worker Siddhant Hashemi Smart, LCSWA  11/27/2012 10:52 AM   Other:    Other:    Other:   Other:    Scribe for Treatment Team:  Trula Slade LCSWA 11/27/2012 10:52 AM

## 2012-11-27 NOTE — H&P (Signed)
Psychiatric Admission Assessment Adult  Patient Identification:  [name] [name]  Date of Evaluation:  11/27/2012  Chief Complaint:  ETOH DEPENDENCE  History of Present Illness: This is a 57 year old African-American female. Admitted from the [facility] with complaints of alcohol intoxication requesting detoxification treatment. Patient reports, "My daughter took me to the [facility] hospital last Monday. She thought that I had a stroke. When we got to the hospital, they questioned me about my alcoholism. I told them the truth. I relapsed on my alcoholism as soon as I got out of this hospital few weeks ago. I was drinking a pint of Vodka daily. I need treatment. I would like to go back to [facility] to continue treatment again. I was at [facility] for 3 weeks in the past when my legs started to swell. They let me go to see about my swollen legs. My legs are better now. I can go back to [facility] and finish my treatment. I'm not depressed or suicidal".  Elements:  Location:  [facility] adult unit. Quality:  Excessive alcohol drinking. Severity:  "Severe, I was drinking a pint a day". Timing:  "I started again as soon as I was discharged fron this hospital a little over 2 weeks ago'. Duration:  "I'm an alcoholic for a long time". Context:  "I crave, I drink a lot of alcohol, I shake, a lot of anxiety".  Associated Signs/Synptoms:  Depression Symptoms:  insomnia, psychomotor agitation, feelings of worthlessness/guilt, hopelessness, anxiety,  (Hypo) Manic Symptoms:  Impulsivity,  Anxiety Symptoms:  Excessive Worry,  Psychotic Symptoms:  Hallucinations: Denies  PTSD Symptoms: Had a traumatic exposure:  None reported  Psychiatric Specialty Exam: Physical Exam  Constitutional: She is oriented to person, place, and time. She appears well-developed.  HENT:  Head: Normocephalic.  Eyes: Pupils are equal, round, and reactive to light.  Neck: Normal range of motion.  Cardiovascular:  Normal rate.   Respiratory: Effort normal.  GI: Soft.  Musculoskeletal:  Unsteady gait and balance   Neurological: She is alert and oriented to person, place, and time.  Skin: Skin is warm and dry.  Psychiatric: Her speech is normal and behavior is normal. Thought content normal. Her mood appears anxious. Cognition and memory are normal. She expresses impulsivity. She does not exhibit a depressed mood.    Review of Systems  Constitutional: Positive for malaise/fatigue.  HENT: Negative.   Eyes: Negative.   Respiratory: Negative.   Cardiovascular: Negative.   Gastrointestinal: Positive for nausea.  Genitourinary: Negative.   Musculoskeletal: Positive for myalgias and joint pain.  Skin: Negative.   Neurological: Positive for tremors and weakness.  Endo/Heme/Allergies: Negative.   Psychiatric/Behavioral: Positive for substance abuse (Alcoholism). Negative for depression, suicidal ideas, hallucinations and memory loss. The patient is nervous/anxious and has insomnia (currently being stabilized with medication.).     Blood pressure 100/71, pulse 108, temperature 98.1 F (36.7 C), temperature source Oral, resp. rate 18, height 5\' 3"  (1.6 m), weight 38.102 kg (84 lb).Body mass index is 14.88 kg/(m^2).  General Appearance: Fairly Groomed  Patent attorney::  Fair  Speech:  Clear and Coherent  Volume:  Normal  Mood:  Anxious  Affect:  Flat  Thought Process:  Coherent and Goal Directed  Orientation:  Full (Time, Place, and Person)  Thought Content:  Rumination  Suicidal Thoughts:  No  Homicidal Thoughts:  No  Memory:  Immediate;   Good Recent;   Good Remote;   Fair  Judgement:  Fair  Insight:  Shallow  Psychomotor Activity:  Tremor  Concentration:  Fair  Recall:  Good  Akathisia:  No  Handed:  Right  AIMS (if indicated):     Assets:  Desire for Improvement  Sleep:  Number of Hours: 6.75    Past Psychiatric History: Diagnosis: Alcohol dependence  Hospitalizations: [facility]    [facility]: [facility]  Substance Abuse Care:  Self-Mutilation: Denies  Suicidal Attempts: Denies attempts and thoughts  Violent Behaviors: None reported   Past Medical History:   Past Medical History  Diagnosis Date  . Anemia, B12 deficiency   . History of acute pancreatitis   . Right knee pain     No recent imaging on chart  . 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 Dr. Su Hilt, [facility]  . Hypertriglyceridemia   . GERD (gastroesophageal reflux disease)   . Vitamin D deficiency   . Subdural hematoma 02/2008    Likely 2/2 trauma from seizure from EtOH withdrawal, chronic in nature, sees Dr. Robyne Askew. Most recent CT head 10/2009 showing stable but persistent hematoma without mass effect.  . History of seizure disorder     Likely 2/2 alcohol abuse  . Hypocalcemia   . Hypomagnesemia   . Failure to thrive in childhood     Unclear etiology  . HTN (hypertension)   . Thrombocytopenia   . Anemia, macrocytic   . Hepatomegaly     On exam  . Alcohol abuse   . Joint pain   . Alcohol abuse   . Arthritis   . Vitamin D deficiency   . Menopause   . Pancreatitis   . Insomnia   . Hyperlipidemia   . Sinusitis   . Pernicious anemia   . Subdural hematoma   . Macrocytic anemia   . Hepatomegaly   . 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 Seizures     1.5 YRS  LAST ONE  . Depression   . Fx humeral neck 04/17/2011    Transverse fracture- minimally displaced- managed as outpatient   . ABNORMAL PAP SMEAR, LGSIL 07/23/2008    Annotation: HPV positive CIN I Dr. Su Hilt, [facility] Qualifier: Diagnosis of  By: Danae Chen    . Pneumonia 05/20/2012  . Hepatitis     HEPATOMEGALY    Seizure History:  Hx of Cardiac History:  Pernicious anemia, hyperlipidemia  Allergies:   Allergies  Allergen Reactions  . Amitriptyline Hcl Swelling    In the face.  . Doxycycline Hyclate Itching     Feels like something crawling under her skin   PTA Medications: Prescriptions prior to admission  Medication Sig Dispense Refill  . acetaminophen (TYLENOL) 500 MG tablet Take 500 mg by mouth at bedtime as needed for pain (sleep).      Marland Kitchen amLODipine (NORVASC) 10 MG tablet Take 0.5 tablets (5 mg total) by mouth daily. For hypertension  30 tablet  3  . amylase-lipase-protease (PANGESTYME EC) 20-4.5-25 MU per capsule Take 1 capsule by mouth 3 (three) times daily with meals. For low pancreatic function  90 capsule  4  . aspirin EC 81 MG tablet Take 81 mg by mouth every evening.      . calcium carbonate (TUMS) 500 MG chewable tablet Chew 6 tablets (1,200 mg of elemental calcium total) by mouth 3 (three) times daily. For bone health  90 tablet  3  . ferrous sulfate 325 (65 FE) MG tablet Take  325 mg by mouth 2 (two) times daily with a meal.      . FLUoxetine (PROZAC) 10 MG capsule Take 1 capsule (10 mg total) by mouth daily. For depression  30 capsule  0  . folic acid (FOLVITE) 1 MG tablet Take 1 tablet (1 mg total) by mouth daily. For folic acid replacement  30 tablet  1  . furosemide (LASIX) 20 MG tablet Take 1 tablet (20 mg total) by mouth daily.  30 tablet  1  . gabapentin (NEURONTIN) 300 MG capsule Take 2 capsules (600 mg total) by mouth 3 (three) times daily. For anxiety/pain control  90 capsule  2  . hydrochlorothiazide (HYDRODIURIL) 25 MG tablet Take 1 tablet (25 mg total) by mouth daily. For high blood pressure control  30 tablet  3  . magnesium oxide (MAG-OX) 400 MG tablet Take 400 mg by mouth 3 (three) times daily.      . Multiple Vitamin (MULTIVITAMIN WITH MINERALS) TABS Take 1 tablet by mouth daily. For vitamin replacement  30 tablet  11  . Nutritional Supplements (ENSURE HIGH PROTEIN PO) Take 1 Can by mouth 3 (three) times daily. chocolate      . omeprazole (PRILOSEC) 40 MG capsule Take 1 capsule (40 mg total) by mouth daily.  30 capsule  3  . potassium chloride SA (KLOR-CON M20) 20 MEQ  tablet Take 1 tablet (20 mEq total) by mouth daily. For low potassium      . thiamine (VITAMIN B-1) 100 MG tablet Take 1 tablet (100 mg total) by mouth daily. For low thiamine  90 tablet  3  . vitamin B-12 (CYANOCOBALAMIN) 250 MCG tablet Take 250 mcg by mouth every evening.      . Vitamin D, Ergocalciferol, (DRISDOL) 50000 UNITS CAPS Take 1 capsule (50,000 Units total) by mouth every 7 (seven) days. Thursdays: For bone health  30 capsule    . VOLTAREN 1 % GEL Apply 1 application topically 3 (three) times daily as needed (pain).       . [DISCONTINUED] ferrous sulfate 325 (65 FE) MG tablet Take 1 tablet (325 mg total) by mouth 2 (two) times daily with a meal. (May purchase from over the counter): For low Iron    3    Previous Psychotropic Medications:  Medication/Dose  See medication lists               Substance Abuse History in the last 12 months:  yes  Consequences of Substance Abuse: Medical Consequences:  Liver damage, Possible death by overdose Legal Consequences:  Arrests, jail time, Loss of driving privilege. Family Consequences:  Family discord, divorce and or separation.  Social History:  reports that she quit smoking about 2 years ago. Her smoking use included Cigarettes. She smoked 0.00 packs per day. She has never used smokeless tobacco. She reports that she drinks about 6.0 ounces of alcohol per week. She reports that she does not use illicit drugs. Additional Social History: Current Place of Residence: [place], [place]   Place of Birth: [place], county   Family Members: "My daughter and live in girlfriend"   Marital Status: Single   Children: 1   Sons: 0   Daughters: 1   Relationships: Single   Education: No high school diploma   Educational Problems/Performance: Did not complete high school   Religious Beliefs/Practices: NA   History of Abuse (Emotional/Phsycial/Sexual): Denies   Occupational Experiences: Disabled   Military History: None.   Legal  History: DUI charges, pending court date  Hobbies/Interests: NA   Family History:   Family History  Problem Relation Age of Onset  . Cancer Mother     Died from stomach cancer and "flesh eating rash  . Heart failure Father     Died in 28s from an MI  . Alcohol abuse Sister     Twin sister drinks a lot, as did both her parents and brothers  . Stroke Brother     Has 7 brothers, 1 with CVA    Results for orders placed during the hospital encounter of 11/24/12 (from the past 72 hour(s))  COMPREHENSIVE METABOLIC PANEL     Status: Abnormal   Collection Time    11/24/12  6:35 PM      Result Value Range   Sodium 133 (*) 135 - 145 mEq/L   Potassium 6.2 (*) 3.5 - 5.1 mEq/L   Comment: HEMOLYSIS AT THIS LEVEL MAY AFFECT RESULT   Chloride 105  96 - 112 mEq/L   CO2 12 (*) 19 - 32 mEq/L   Glucose, Bld 118 (*) 70 - 99 mg/dL   BUN 16  6 - 23 mg/dL   Creatinine, Ser 2.53 (*) 0.50 - 1.10 mg/dL   Calcium 7.5 (*) 8.4 - 10.5 mg/dL   Total Protein 66.4 (*) 6.0 - 8.3 g/dL   Albumin 2.6 (*) 3.5 - 5.2 g/dL   AST 403 (*) 0 - 37 U/L   ALT 25  0 - 35 U/L   Comment: HEMOLYSIS AT THIS LEVEL MAY AFFECT RESULT   Alkaline Phosphatase 462 (*) 39 - 117 U/L   Comment: HEMOLYSIS AT THIS LEVEL MAY AFFECT RESULT   Total Bilirubin 0.3  0.3 - 1.2 mg/dL   GFR calc non Af Amer 44 (*) >90 mL/min   GFR calc Af Amer 51 (*) >90 mL/min   Comment:            The eGFR has been calculated     using the CKD EPI equation.     This calculation has not been     validated in all clinical     situations.     eGFR's persistently     <90 mL/min signify     possible Chronic Kidney Disease.  CBC WITH DIFFERENTIAL     Status: Abnormal   Collection Time    11/24/12  6:35 PM      Result Value Range   WBC 8.7  4.0 - 10.5 K/uL   RBC 2.97 (*) 3.87 - 5.11 MIL/uL   Hemoglobin 10.2 (*) 12.0 - 15.0 g/dL   HCT 47.4 (*) 25.9 - 56.3 %   MCV 104.4 (*) 78.0 - 100.0 fL   MCH 34.3 (*) 26.0 - 34.0 pg   MCHC 32.9  30.0 - 36.0 g/dL    RDW 87.5  64.3 - 32.9 %   Platelets 311  150 - 400 K/uL   Neutrophils Relative % 50  43 - 77 %   Neutro Abs 4.4  1.7 - 7.7 K/uL   Lymphocytes Relative 33  12 - 46 %   Lymphs Abs 2.9  0.7 - 4.0 K/uL   Monocytes Relative 13 (*) 3 - 12 %   Monocytes Absolute 1.1 (*) 0.1 - 1.0 K/uL   Eosinophils Relative 4  0 - 5 %   Eosinophils Absolute 0.3  0.0 - 0.7 K/uL   Basophils Relative 1  0 - 1 %   Basophils Absolute 0.1  0.0 - 0.1 K/uL  LIPASE, BLOOD  Status: None   Collection Time    11/24/12  6:35 PM      Result Value Range   Lipase 30  11 - 59 U/L  POTASSIUM     Status: None   Collection Time    11/24/12  7:54 PM      Result Value Range   Potassium 3.8  3.5 - 5.1 mEq/L  ETHANOL     Status: Abnormal   Collection Time    11/24/12  7:54 PM      Result Value Range   Alcohol, Ethyl (B) 214 (*) 0 - 11 mg/dL   Comment:            LOWEST DETECTABLE LIMIT FOR     SERUM ALCOHOL IS 11 mg/dL     FOR MEDICAL PURPOSES ONLY  ETHANOL     Status: None   Collection Time    11/25/12 11:05 AM      Result Value Range   Alcohol, Ethyl (B) <11  0 - 11 mg/dL   Comment:            LOWEST DETECTABLE LIMIT FOR     SERUM ALCOHOL IS 11 mg/dL     FOR MEDICAL PURPOSES ONLY  URINE RAPID DRUG SCREEN (HOSP PERFORMED)     Status: Abnormal   Collection Time    11/25/12  5:48 PM      Result Value Range   Opiates NONE DETECTED  NONE DETECTED   Cocaine NONE DETECTED  NONE DETECTED   Benzodiazepines POSITIVE (*) NONE DETECTED   Amphetamines NONE DETECTED  NONE DETECTED   Tetrahydrocannabinol NONE DETECTED  NONE DETECTED   Barbiturates NONE DETECTED  NONE DETECTED   Comment:            DRUG SCREEN FOR MEDICAL PURPOSES     ONLY.  IF CONFIRMATION IS NEEDED     FOR ANY PURPOSE, NOTIFY LAB     WITHIN 5 DAYS.                LOWEST DETECTABLE LIMITS     FOR URINE DRUG SCREEN     Drug Class       Cutoff (ng/mL)     Amphetamine      1000     Barbiturate      200     Benzodiazepine   200     Tricyclics        300     Opiates          300     Cocaine          300     THC              50  POTASSIUM     Status: None   Collection Time    11/26/12 12:10 PM      Result Value Range   Potassium 4.2  3.5 - 5.1 mEq/L   Psychological Evaluations:  Assessment:   AXIS I:  Alcohol dependence AXIS II:  Deferred AXIS III:   Past Medical History  Diagnosis Date  . Anemia, B12 deficiency   . History of acute pancreatitis   . Right knee pain     No recent imaging on chart  . 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 Dr. Su Hilt, [facility]  . Hypertriglyceridemia   . GERD (gastroesophageal reflux disease)   . Vitamin D deficiency   . Subdural hematoma  02/2008    Likely 2/2 trauma from seizure from EtOH withdrawal, chronic in nature, sees Dr. Robyne Askew. Most recent CT head 10/2009 showing stable but persistent hematoma without mass effect.  . History of seizure disorder     Likely 2/2 alcohol abuse  . Hypocalcemia   . Hypomagnesemia   . Failure to thrive in childhood     Unclear etiology  . HTN (hypertension)   . Thrombocytopenia   . Anemia, macrocytic   . Hepatomegaly     On exam  . Alcohol abuse   . Joint pain   . Alcohol abuse   . Arthritis   . Vitamin D deficiency   . Menopause   . Pancreatitis   . Insomnia   . Hyperlipidemia   . Sinusitis   . Pernicious anemia   . Subdural hematoma   . Macrocytic anemia   . Hepatomegaly   . 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 Seizures     1.5 YRS  LAST ONE  . Depression   . Fx humeral neck 04/17/2011    Transverse fracture- minimally displaced- managed as outpatient   . ABNORMAL PAP SMEAR, LGSIL 07/23/2008    Annotation: HPV positive CIN I Dr. Su Hilt, [facility] Qualifier: Diagnosis of  By: Danae Chen    . Pneumonia 05/20/2012  . Hepatitis     HEPATOMEGALY    AXIS IV:  other psychosocial or environmental problems and Alcoholism AXIS V:  1-10  persistent dangerousness to self and others present  Treatment Plan/Recommendations: 1. Admit for crisis management and stabilization, estimated length of stay 3-5 days.  2. Medication management to reduce current symptoms to base line and improve the patient's overall level of functioning  3. Treat health problems as indicated; (a). Physical therapy to evaluate and treat.  4. Develop treatment plan to decrease risk of relapse upon discharge and the need for readmission.  5. Psycho-social education regarding relapse prevention and self care.  6. Health care follow up as needed for medical problems.  7. Review, reconcile, and reinstate any pertinent home medications for other health issues where appropriate. 8. Call for consults with hospitalist for any additional specialty patient care services as needed.  Treatment Plan Summary: Daily contact with patient to assess and evaluate symptoms and progress in treatment Medication management  Current Medications:  Current Facility-Administered Medications  Medication Dose Route Frequency Provider Last Rate Last Dose  . acetaminophen (TYLENOL) tablet 650 mg  650 mg Oral Q6H PRN Sanjuana Kava, NP      . alum & mag hydroxide-simeth (MAALOX/MYLANTA) 200-200-20 MG/5ML suspension 30 mL  30 mL Oral Q4H PRN Sanjuana Kava, NP      . amLODipine (NORVASC) tablet 5 mg  5 mg Oral Daily Sanjuana Kava, NP   5 mg at 11/27/12 0755  . aspirin EC tablet 81 mg  81 mg Oral QPM Sanjuana Kava, NP   81 mg at 11/26/12 1723  . calcium carbonate (TUMS - dosed in mg elemental calcium) chewable tablet 1,200 mg of elemental calcium  6 tablet Oral TID Sanjuana Kava, NP   1,200 mg of elemental calcium at 11/27/12 0756  . chlordiazePOXIDE (LIBRIUM) capsule 25 mg  25 mg Oral Q6H PRN Sanjuana Kava, NP      . chlordiazePOXIDE (LIBRIUM) capsule 25 mg  25 mg Oral QID Sanjuana Kava, NP   25 mg at 11/27/12 0755   Followed by  . [START  ON 11/28/2012] chlordiazePOXIDE (LIBRIUM) capsule 25  mg  25 mg Oral TID Sanjuana Kava, NP       Followed by  . [START ON 11/29/2012] chlordiazePOXIDE (LIBRIUM) capsule 25 mg  25 mg Oral BH-qamhs Sanjuana Kava, NP       Followed by  . [START ON 11/30/2012] chlordiazePOXIDE (LIBRIUM) capsule 25 mg  25 mg Oral Daily Sanjuana Kava, NP      . diclofenac sodium (VOLTAREN) 1 % transdermal gel 1 application  1 application Topical TID PRN Sanjuana Kava, NP      . ferrous sulfate tablet 325 mg  325 mg Oral BID WC Sanjuana Kava, NP   325 mg at 11/27/12 0756  . FLUoxetine (PROZAC) capsule 10 mg  10 mg Oral Daily Sanjuana Kava, NP   10 mg at 11/27/12 0755  . folic acid (FOLVITE) tablet 1 mg  1 mg Oral Daily Sanjuana Kava, NP   1 mg at 11/27/12 0755  . furosemide (LASIX) tablet 20 mg  20 mg Oral Daily Sanjuana Kava, NP   20 mg at 11/27/12 0755  . gabapentin (NEURONTIN) capsule 600 mg  600 mg Oral TID Sanjuana Kava, NP   600 mg at 11/27/12 0755  . hydrochlorothiazide (HYDRODIURIL) tablet 25 mg  25 mg Oral Daily Sanjuana Kava, NP   25 mg at 11/27/12 0755  . hydrOXYzine (ATARAX/VISTARIL) tablet 25 mg  25 mg Oral Q6H PRN Sanjuana Kava, NP      . lipase/protease/amylase (CREON-12/PANCREASE) capsule 1 capsule  1 capsule Oral TID AC Sanjuana Kava, NP   1 capsule at 11/27/12 0630  . loperamide (IMODIUM) capsule 2-4 mg  2-4 mg Oral PRN Sanjuana Kava, NP      . magnesium hydroxide (MILK OF MAGNESIA) suspension 30 mL  30 mL Oral Daily PRN Sanjuana Kava, NP      . magnesium oxide (MAG-OX) tablet 400 mg  400 mg Oral TID Sanjuana Kava, NP   400 mg at 11/27/12 0756  . multivitamin with minerals tablet 1 tablet  1 tablet Oral Daily Sanjuana Kava, NP   1 tablet at 11/27/12 0756  . ondansetron (ZOFRAN-ODT) disintegrating tablet 4 mg  4 mg Oral Q6H PRN Sanjuana Kava, NP      . pantoprazole (PROTONIX) EC tablet 80 mg  80 mg Oral Daily Sanjuana Kava, NP   80 mg at 11/27/12 0755  . potassium chloride SA (K-DUR,KLOR-CON) CR tablet 20 mEq  20 mEq Oral Daily Sanjuana Kava, NP   20 mEq at  11/27/12 0755  . thiamine (B-1) injection 100 mg  100 mg Intramuscular Once Sanjuana Kava, NP      . thiamine (VITAMIN B-1) tablet 100 mg  100 mg Oral Daily Sanjuana Kava, NP   100 mg at 11/27/12 0756  . traZODone (DESYREL) tablet 50 mg  50 mg Oral QHS PRN Sanjuana Kava, NP   50 mg at 11/26/12 2131  . vitamin B-12 (CYANOCOBALAMIN) tablet 250 mcg  250 mcg Oral QPM Sanjuana Kava, NP      . Melene Muller ON 11/28/2012] Vitamin D (Ergocalciferol) (DRISDOL) capsule 50,000 Units  50,000 Units Oral Q7 days Sanjuana Kava, NP        Observation Level/Precautions:  15 minute checks  Laboratory:  Reviewed ED lab findings on file  Psychotherapy: Group sessions   Medications:  See medication lists  Consultations: Physical therapy to evaluate  and treat for weak gait and unsteady balance  Discharge Concerns: Maintaining sobriety   Estimated LOS: 3-5 days  Other:     I certify that inpatient services furnished can reasonably be expected to improve the patient's condition.   Sanjuana Kava, FNP, PMHNP-BC 6/25/20149:40 AM  Patient is personally evaluated and care plan developed and case discussed with physician extender. Reviewed the information documented and agree with the treatment plan.  Fumi Guadron,JANARDHAHA R. 11/28/2012 6:37 PM

## 2012-11-27 NOTE — [facility] Counselor (Signed)
Adult Psychosocial Assessment Update Interdisciplinary Team  Previous [facility] admissions/discharges:  Admissions   Date: 11/26/12 Date:  Date: 11/06/12 Date:  Date: Date:  Date: Date:  Date: Date:   Changes since the last Psychosocial Assessment (including adherence to outpatient mental health and/or substance abuse treatment, situational issues contributing to decompensation and/or relapse). Pt reports that she did follow up at [facility] for med management with psychiatrist and began the [facility] IOP program--Nathan at [facility] contacted to inform that pt was at [facility]. Pt reports that she became increasingly depressed but was taking meds as prescribed. She began drinking wine every other day and gradually increased alcohol intake until return to [facility] yesterday.              Discharge Plan 1. Will you be returning to the same living situation after discharge?   Yes: No:      If no, what is your plan?    Patient hoping to get into [facility] after discharge (if medically stable). Pt not eligible for [facility]--was at this facility three months ago. If no bed available at [facility], pt plans to return to [facility] of [facility] IOP and medication management. She also has AA schedule and plans to attend AA daily.        2. Would you like a referral for services when you are discharged? Yes:     If yes, for what services?  No:       [facility], [facility]--already contacted and notified that pt is at [facility]. Pt plans to return for med management and SA IOP.        Summary and Recommendations (to be completed by the evaluator) Pt is 57 year old female living with a friend in [place], [place]. She follows up at [facility] of med management and SA IOP. Pt hopes to get into [facility] after d/c if medically stable. If no bed availiability at [facility], pt will continue with [facility] IOP at [facility] (and med management). She also plans to attend daily AA  meetings at [employer] in [place]. Recommendations for pt include: therapeutic milieu, encourage group attendance and participation, librium taper for detox, medication management for mood stabilization, and development of comprehensive mental wellness/sobriety plan.                        Signature:  Trula Slade, 11/27/2012 12:39 PM

## 2012-11-27 NOTE — Progress Notes (Signed)
Adult Psychoeducational Group Note  Date:  11/27/2012 Time:  12:58 PM  Group Topic/Focus:  Personal Choices and Values:   The focus of this group is to help patients assess and explore the importance of values in their lives, how their values affect their decisions, how they express their values and what opposes their expression.  Participation Level:  Active  Participation Quality:  Appropriate  Affect:  Appropriate  Cognitive:  Alert and Appropriate  Insight: Appropriate  Engagement in Group:  Engaged  Modes of Intervention:  Discussion and Education  Additional Comments:  Pt discussed wanting" to get better" as her goal for the day. She spoke freely about drinking and how she doesn't plan to go back to drinking when she is discharged. Pt indicated she poured out her alcohol before she came into the hospital.  Reynolds Bowl 11/27/2012, 12:58 PM

## 2012-11-27 NOTE — Progress Notes (Signed)
Pt reports she is shaky and sleepy.  Requested something to help her sleep.  Pt given Trazodone along with her scheduled Librium.  Pt reports her withdrawal symptoms are minimal at this time.  She denies SI/HI/AV.  Pt encouraged to make her needs known to staff.  Pt voices understanding.  Pt attended evening AA group.  Pt has been pleasant/appropriate.  Support and encouragement offered.  Safety maintained with q15 minute checks.

## 2012-11-27 NOTE — [facility] Suicide Risk Assessment (Signed)
[facility] INPATIENT: Family/Significant Other Suicide Prevention Education  Suicide Prevention Education:  Education Completed; No one has been identified by the patient as the family member/significant other with whom the patient will be residing, and identified as the person(s) who will aid the patient in the event of a mental health crisis (suicidal ideations/suicide attempt). With written consent from the patient, the family member/significant other has been provided the following suicide prevention education, prior to the and/or following the discharge of the patient.  The suicide prevention education provided includes the following:  Suicide risk factors  Suicide prevention and interventions  National Suicide Hotline telephone number  [facility] assessment telephone number  [facility] Emergency Assistance 911  [facility] and/or Residential Mobile Crisis Unit telephone number Request made of family/significant other to:  Remove weapons (e.g., guns, rifles, knives), all items previously/currently identified as safety concern.  Remove drugs/medications (over-the-counter, prescriptions, illicit drugs), all items previously/currently identified as a safety concern. The family member/significant other verbalizes understanding of the suicide prevention education information provided. The family member/significant other agrees to remove the items of safety concern listed above.  Pt did not c/o SI at admission, nor have they endorsed SI during their stay here. SPE not required. Pt provided with SPI pamphlet and encouraged to ask any questions about SPE.   [employer], LCSWA  11/27/2012 12:35 PM

## 2012-11-27 NOTE — Progress Notes (Signed)
Adult Psychoeducational Group Note  Date:  11/27/2012 Time:  10:11 PM  Group Topic/Focus:  NA group  Participation Level:  Active  Participation Quality:  Appropriate  Affect:  Appropriate  Cognitive:  Alert  Insight: Appropriate  Engagement in Group:  Engaged  Modes of Intervention:  Discussion  Additional Comments:    Flonnie Hailstone 11/27/2012, 10:11 PM

## 2012-11-27 NOTE — [facility] Suicide Risk Assessment (Signed)
Suicide Risk Assessment  Admission Assessment     Nursing information obtained from:    Demographic factors:    Current Mental Status:    Loss Factors:    Historical Factors:    Risk Reduction Factors:     CLINICAL FACTORS:   Alcohol/Substance Abuse/Dependencies Previous Psychiatric Diagnoses and Treatments Medical Diagnoses and Treatments/Surgeries  COGNITIVE FEATURES THAT CONTRIBUTE TO RISK:  Closed-mindedness Polarized thinking    SUICIDE RISK:   Minimal: No identifiable suicidal ideation.  Patients presenting with no risk factors but with morbid ruminations; may be classified as minimal risk based on the severity of the depressive symptoms  PLAN OF CARE: Patient is admitted emergently and voluntarily to [facility] from [name] for alcohol detox treatment. She wants to quit drinking due to medical and legal consequences. She could not complete [facility].  I certify that inpatient services furnished can reasonably be expected to improve the patient's condition.  [name] [name],JANARDHAHA R. 11/27/2012, 3:01 PM

## 2012-11-27 NOTE — [facility] Group Notes (Signed)
[facility] LCSW Group Therapy  11/27/2012 2:57 PM  Type of Therapy:  Group Therapy  Participation Level:  Active  Participation Quality:  Appropriate  Affect:  Appropriate  Cognitive:  Appropriate  Insight:  Improving  Engagement in Therapy:  Improving  Modes of Intervention:  Discussion, Education, Exploration, Socialization and Support  Summary of Progress/Problems: Emotion Regulation: This group focused on both positive and negative emotion identification and allowed group members to process ways to identify feelings, regulate negative emotions, and find healthy ways to manage internal/external emotions. Group members were asked to reflect on a time when their reaction to an emotion led to a negative outcome and explored how alternative responses using emotion regulation would have benefited them. Group members were also asked to discuss a time when emotion regulation was utilized when a negative emotion was experienced. [name] [name] identified sadness, depression, and anxiety as primary emotions experienced recently. She explored how these emotions feel both physically and mentally. [name] [name] shared an experience with the group where she handled negative emotions in a negative way by lashing out at others. She explored alternative/healthier ways of dealing with negative emotions--going for a walk, cutting ties with bad influences, and avoiding alcohol/attending AA.    Smart, [name] 11/27/2012, 2:57 PM

## 2012-11-28 MED ORDER — ENSURE COMPLETE PO LIQD
237.0000 mL | Freq: Two times a day (BID) | ORAL | Status: DC
  Administered 2012-11-28 (×2): 237 mL via ORAL
  Filled 2012-11-28 (×3): qty 237

## 2012-11-28 NOTE — [facility] Group Notes (Signed)
[facility] LCSW Aftercare Discharge Planning Group Note   11/28/2012 10:20 AM  Participation Quality:  Appropriate   Mood/Affect:  Appropriate  Depression Rating:  0  Anxiety Rating:  0  Thoughts of Suicide:  No Will you contract for safety?   NA  Current AVH:  No  Plan for Discharge/Comments:  Pt hopes to get into [facility] if bed available on d/c date. If not, pt will follow up with [facility] for med management and SA IOP. [facility] contacted. Pt able to return to program. She also created AA schedule for herself and plan to attend AA meetings daily.   Transportation Means: friend/family member  Supports: [employer] supports, some Engineer, water, Research scientist (physical sciences)

## 2012-11-28 NOTE — Progress Notes (Signed)
D:  Patient's self inventory sheet, patient sleeps well, has good appetite, normal energy level, good attention span.  Rated depression and hopelessness #1.  Denied withdrawals.  Denied SI.  Denied physical problems.  After discharge, plans to go to meeting, try to get rehab.  "Thank you for your care."  Does have discharge plans.  No problems taking meds after discharge. A:  Medications administered per MD orders.  Emotional support and encouragement given to patient. R:  Denied SI and HI.  Contracts for safety.  Denied A/V hallucinations.  Denied pain.

## 2012-11-28 NOTE — Progress Notes (Signed)
[facility] MD Progress Note  11/28/2012 1:46 PM [name] [name]  MRN:  [id number]  Subjective:  [name] [name] reports that her mood is improving gradually. However, continue to endorse some shakes, anxiety and restlessness. Expressed her desire to go to a treatment center after discharge.  She is hoping to get into [facility]" as [facility] are saying that there were no beds available.  Diagnosis:   Axis I: Alcohol dependence, Pancreatitis Axis II: Deferred Axis III:  Past Medical History  Diagnosis Date  . Anemia, B12 deficiency   . History of acute pancreatitis   . Right knee pain     No recent imaging on chart  . 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 Dr. Su Hilt, [facility]  . Hypertriglyceridemia   . GERD (gastroesophageal reflux disease)   . Vitamin D deficiency   . Subdural hematoma 02/2008    Likely 2/2 trauma from seizure from EtOH withdrawal, chronic in nature, sees Dr. Robyne Askew. Most recent CT head 10/2009 showing stable but persistent hematoma without mass effect.  . History of seizure disorder     Likely 2/2 alcohol abuse  . Hypocalcemia   . Hypomagnesemia   . Failure to thrive in childhood     Unclear etiology  . HTN (hypertension)   . Thrombocytopenia   . Anemia, macrocytic   . Hepatomegaly     On exam  . Alcohol abuse   . Joint pain   . Alcohol abuse   . Arthritis   . Vitamin D deficiency   . Menopause   . Pancreatitis   . Insomnia   . Hyperlipidemia   . Sinusitis   . Pernicious anemia   . Subdural hematoma   . Macrocytic anemia   . Hepatomegaly   . 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 Seizures     1.5 YRS  LAST ONE  . Depression   . Fx humeral neck 04/17/2011    Transverse fracture- minimally displaced- managed as outpatient   . ABNORMAL PAP SMEAR, LGSIL 07/23/2008    Annotation: HPV positive CIN I Dr. Su Hilt, [facility] Qualifier: Diagnosis of  By: Danae Chen     . Pneumonia 05/20/2012  . Hepatitis     HEPATOMEGALY    Axis IV: Alcoholism, chronic health issues Axis V: 41-50 serious symptoms  ADL's:  Intact  Sleep: Fair per patient reports, 6.5 hours per documentation.  Appetite:  Fair  Suicidal Ideation:  Plan:  Denies Intent:  Denies Means:  Denies Homicidal Ideation:  Plan:  Denies Intent:  Denies Means:  Denies  AEB (as evidenced by):  Psychiatric Specialty Exam: Review of Systems  Constitutional: Positive for chills and malaise/fatigue.  HENT: Negative.   Eyes: Negative.   Respiratory: Negative.   Cardiovascular: Negative.   Gastrointestinal: Negative.   Genitourinary: Negative.   Musculoskeletal: Positive for myalgias.  Skin: Negative.   Neurological: Positive for tremors and weakness.  Endo/Heme/Allergies: Negative.   Psychiatric/Behavioral: Positive for substance abuse (Alcoholism). Negative for depression, suicidal ideas, hallucinations and memory loss. The patient is nervous/anxious (Currently being stabilized with medication) and has insomnia (Currently being stabilized with medication).     Blood pressure 129/84, pulse 90, temperature 98.5 F (36.9 C), temperature source Oral, resp. rate 18, height 5\' 3"  (1.6 m), weight 38.102 kg (84 lb).Body mass index is 14.88 kg/(m^2).  General Appearance: Casual and Thin, frail  Eye Contact::  Fair  Speech:  Clear and Coherent  Volume:  Normal  Mood:  "My mood is improving"  Affect:  Flat, worried  Thought Process:  Coherent and Goal Directed  Orientation:  Full (Time, Place, and Person)  Thought Content:  Rumination  Suicidal Thoughts:  No  Homicidal Thoughts:  No  Memory:  Immediate;   Good Recent;   Good Remote;   Fair  Judgement:  Fair  Insight:  Fair  Psychomotor Activity:  Restlessness and Tremor  Concentration:  Fair  Recall:  Good  Akathisia:  No  Handed:  Right  AIMS (if indicated):     Assets:  Desire for Improvement  Sleep:  Number of Hours: 6.75    Current Medications: Current Facility-Administered Medications  Medication Dose Route Frequency Provider Last Rate Last Dose  . acetaminophen (TYLENOL) tablet 650 mg  650 mg Oral Q6H PRN Sanjuana Kava, NP      . alum & mag hydroxide-simeth (MAALOX/MYLANTA) 200-200-20 MG/5ML suspension 30 mL  30 mL Oral Q4H PRN Sanjuana Kava, NP      . amLODipine (NORVASC) tablet 5 mg  5 mg Oral Daily Sanjuana Kava, NP   5 mg at 11/28/12 0802  . aspirin EC tablet 81 mg  81 mg Oral QPM Sanjuana Kava, NP   81 mg at 11/27/12 1728  . calcium carbonate (TUMS - dosed in mg elemental calcium) chewable tablet 1,200 mg of elemental calcium  6 tablet Oral TID Sanjuana Kava, NP   1,200 mg of elemental calcium at 11/28/12 1154  . chlordiazePOXIDE (LIBRIUM) capsule 25 mg  25 mg Oral Q6H PRN Sanjuana Kava, NP      . chlordiazePOXIDE (LIBRIUM) capsule 25 mg  25 mg Oral TID Sanjuana Kava, NP   25 mg at 11/28/12 1151   Followed by  . [START ON 11/29/2012] chlordiazePOXIDE (LIBRIUM) capsule 25 mg  25 mg Oral BH-qamhs Sanjuana Kava, NP       Followed by  . [START ON 11/30/2012] chlordiazePOXIDE (LIBRIUM) capsule 25 mg  25 mg Oral Daily Sanjuana Kava, NP      . diclofenac sodium (VOLTAREN) 1 % transdermal gel 1 application  1 application Topical TID PRN Sanjuana Kava, NP      . feeding supplement (ENSURE COMPLETE) liquid 237 mL  237 mL Oral BID BM Jeoffrey Massed, RD   237 mL at 11/28/12 1110  . ferrous sulfate tablet 325 mg  325 mg Oral BID WC Sanjuana Kava, NP   325 mg at 11/28/12 0804  . FLUoxetine (PROZAC) capsule 10 mg  10 mg Oral Daily Sanjuana Kava, NP   10 mg at 11/28/12 0804  . folic acid (FOLVITE) tablet 1 mg  1 mg Oral Daily Sanjuana Kava, NP   1 mg at 11/28/12 0803  . furosemide (LASIX) tablet 20 mg  20 mg Oral Daily Sanjuana Kava, NP   20 mg at 11/28/12 0805  . gabapentin (NEURONTIN) capsule 600 mg  600 mg Oral TID Sanjuana Kava, NP   600 mg at 11/28/12 1151  . hydrochlorothiazide (HYDRODIURIL) tablet 25 mg  25 mg Oral  Daily Sanjuana Kava, NP   25 mg at 11/28/12 1610  . hydrOXYzine (ATARAX/VISTARIL) tablet 25 mg  25 mg Oral Q6H PRN Sanjuana Kava, NP      . lipase/protease/amylase (CREON-12/PANCREASE) capsule 1 capsule  1 capsule Oral TID AC Sanjuana Kava, NP   1 capsule at 11/28/12 1153  .  loperamide (IMODIUM) capsule 2-4 mg  2-4 mg Oral PRN Sanjuana Kava, NP   2 mg at 11/27/12 1938  . magnesium hydroxide (MILK OF MAGNESIA) suspension 30 mL  30 mL Oral Daily PRN Sanjuana Kava, NP      . magnesium oxide (MAG-OX) tablet 400 mg  400 mg Oral TID Sanjuana Kava, NP   400 mg at 11/28/12 1152  . multivitamin with minerals tablet 1 tablet  1 tablet Oral Daily Sanjuana Kava, NP   1 tablet at 11/28/12 0807  . ondansetron (ZOFRAN-ODT) disintegrating tablet 4 mg  4 mg Oral Q6H PRN Sanjuana Kava, NP      . pantoprazole (PROTONIX) EC tablet 80 mg  80 mg Oral Daily Sanjuana Kava, NP   80 mg at 11/28/12 0807  . potassium chloride SA (K-DUR,KLOR-CON) CR tablet 20 mEq  20 mEq Oral Daily Sanjuana Kava, NP   20 mEq at 11/28/12 0807  . thiamine (B-1) injection 100 mg  100 mg Intramuscular Once Sanjuana Kava, NP      . thiamine (VITAMIN B-1) tablet 100 mg  100 mg Oral Daily Sanjuana Kava, NP   100 mg at 11/28/12 4540  . traZODone (DESYREL) tablet 50 mg  50 mg Oral QHS PRN Sanjuana Kava, NP   50 mg at 11/27/12 2144  . vitamin B-12 (CYANOCOBALAMIN) tablet 250 mcg  250 mcg Oral QPM Sanjuana Kava, NP   250 mcg at 11/27/12 1728  . Vitamin D (Ergocalciferol) (DRISDOL) capsule 50,000 Units  50,000 Units Oral Q7 days Sanjuana Kava, NP   50,000 Units at 11/28/12 1109    Lab Results: No results found for this or any previous visit (from the past 48 hour(s)).  Physical Findings: AIMS: Facial and Oral Movements Muscles of Facial Expression: None, normal Lips and Perioral Area: None, normal Jaw: None, normal Tongue: None, normal,Extremity Movements Upper (arms, wrists, hands, fingers): None, normal Lower (legs, knees, ankles, toes): None,  normal, Trunk Movements Neck, shoulders, hips: None, normal, Overall Severity Severity of abnormal movements (highest score from questions above): None, normal Incapacitation due to abnormal movements: None, normal Patient's awareness of abnormal movements (rate only patient's report): No Awareness, Dental Status Current problems with teeth and/or dentures?: No Does patient usually wear dentures?: No  CIWA:  CIWA-Ar Total: 0 COWS:  COWS Total Score: 2  Treatment Plan Summary: Daily contact with patient to assess and evaluate symptoms and progress in treatment Medication management  Plan: Supportive approach/coping skills/relapse prevention. Encouraged out of room, participation in group sessions and application of coping skills when distressed. Will continue to monitor response to/adverse effects of medications in use to assure effectiveness. Continue to monitor mood, behavior and interaction with staff and other patients. Continue current plan of care.  Medical Decision Making Problem Points:  Established problem, stable/improving (1), Review of last therapy session (1) and Review of psycho-social stressors (1) Data Points:  Review of medication regiment & side effects (2) Review of new medications or change in dosage (2)  I certify that inpatient services furnished can reasonably be expected to improve the patient's condition.   Armandina Stammer I, PMHNP, FNP-BC 11/28/2012, 1:46 PM

## 2012-11-28 NOTE — Evaluation (Signed)
Physical Therapy Evaluation Patient Details Name: [name] [name] MRN: [id number] DOB: 10/01/1955 Today's Date: 11/28/2012 Time: 4098-1191 PT Time Calculation (min): 10 min  PT Assessment / Plan / Recommendation History of Present Illness     Clinical Impression  Pt admitted to [facility] for detox after hospitalization for alcohol intoxication and pancreatitis.  Pt presents with unsteady gait, poor balance, and hx of falls.  Pt would benefit from acute PT services in order to improve these deficits.  Plans per chart to d/c to [facility] or other rehab center.  Would benefit from f/u PT upon d/c.    PT Assessment  Patient needs continued PT services    Follow Up Recommendations  Home health PT;Supervision for mobility/OOB    Does the patient have the potential to tolerate intense rehabilitation      Barriers to Discharge        Equipment Recommendations  None recommended by PT (pt reports she has SPC at home)    Recommendations for Other Services     Frequency Min 2X/week    Precautions / Restrictions Precautions Precautions: Fall   Pertinent Vitals/Pain n/a      Mobility  Bed Mobility Bed Mobility: Supine to Sit;Sit to Supine Supine to Sit: 6: Modified independent (Device/Increase time) Sit to Supine: 6: Modified independent (Device/Increase time) Transfers Transfers: Sit to Stand;Stand to Sit Sit to Stand: 4: Min assist;With upper extremity assist;From bed Stand to Sit: 4: Min guard;With upper extremity assist;To bed Details for Transfer Assistance: required assist to steady during rise, pt used posterior lean of LE against bed to help stabilize Ambulation/Gait Ambulation/Gait Assistance: 4: Min guard Ambulation Distance (Feet): 150 Feet Assistive device: Straight cane Ambulation/Gait Assistance Details: pt reports R LE feels weaker than L so educated to place can on L side and verbal cues for sequencing, pt very unsteady initially but did better with increased  distance Gait Pattern: Step-through pattern;Narrow base of support    Exercises     PT Diagnosis: Difficulty walking  PT Problem List: Decreased balance;Decreased mobility PT Treatment Interventions: DME instruction;Gait training;Functional mobility training;Patient/family education;Balance training;Neuromuscular re-education     PT Goals(Current goals can be found in the care plan section) Acute Rehab PT Goals PT Goal Formulation: With patient Time For Goal Achievement: 12/12/12 Potential to Achieve Goals: Good  Visit Information  Last PT Received On: 11/28/12 Assistance Needed: +1       Prior Functioning  Home Living Family/patient expects to be discharged to:: Private residence Living Arrangements: Spouse/significant other Home Equipment: Gilmer Mor - single point Prior Function Level of Independence: Independent with assistive device(s) Communication Communication: No difficulties    Cognition  Cognition Arousal/Alertness: Awake/alert Behavior During Therapy: WFL for tasks assessed/performed Overall Cognitive Status: Within Functional Limits for tasks assessed    Extremity/Trunk Assessment Lower Extremity Assessment Lower Extremity Assessment: LLE deficits/detail;RLE deficits/detail RLE Deficits / Details: hip flexion 3+/5 LLE Deficits / Details: hip flexion 3+/5   Balance Balance Balance Assessed: Yes Static Standing Balance Static Standing - Comment/# of Minutes: pt unable to get to tandem stance with assist to steady and then only able to hold for less than 6 sec on each side Single Leg Stance - Right Leg: 15 Single Leg Stance - Left Leg: 15 Tandem Stance - Right Leg: 0 Tandem Stance - Left Leg: 0  End of Session PT - End of Session Activity Tolerance: Patient tolerated treatment well Patient left: in bed  GP Functional Assessment Tool Used: clinical judgement Functional Limitation: Mobility: Walking and  moving around Mobility: Walking and Moving Around  Current Status [phone]): At least 20 percent but less than 40 percent impaired, limited or restricted Mobility: Walking and Moving Around Goal Status [phone]): At least 1 percent but less than 20 percent impaired, limited or restricted   Juliona Vales,KATHrine E 11/28/2012, 3:52 PM Zenovia Jarred, PT, DPT 11/28/2012 Pager: [phone]

## 2012-11-28 NOTE — Progress Notes (Signed)
Adult Psychoeducational Group Note  Date:  11/28/2012 Time:  3:03 PM  Group Topic/Focus: Stress Management Overcoming Stress:   The focus of this group is to define stress and help patients assess their triggers.  Participation Level:  Did Not Attend  Participation Quality:  None  Affect:  None  Cognitive:  None  Insight: None  Engagement in Group:  None  Modes of Intervention:  None   Additional Comments:  Patient did not attend group.  Delia Chimes 11/28/2012, 3:03 PM

## 2012-11-28 NOTE — Progress Notes (Signed)
D   Pt is calm and cooperative   She requested her sleep medications early but then decided to go to [facility]   She is pleasant on approach   She reports feeling anxious and depressed   She has an unsteady gait and walks with a cane   She reportsw minimal signs of withdrawal A   Verbal support given   Medications administered and effectiveness monitored   Q 15 min checks R   Pt safe at present

## 2012-11-28 NOTE — [facility] Group Notes (Signed)
[facility] LCSW Group Therapy  11/28/2012 2:48 PM  Type of Therapy:  Group Therapy  Participation Level:  Minimal  Participation Quality:  Drowsy  Affect:  Lethargic  Cognitive:  Lacking  Insight:  Lacking  Engagement in Therapy:  Lacking  Modes of Intervention:  Discussion, Education, Exploration, Socialization and Support  Summary of Progress/Problems:  Finding Balance in Life. Today's group focused on defining balance in one's own words, identifying things that can knock one off balance, and exploring healthy ways to maintain balance in life. Group members were asked to provide an example of a time when they felt off balance, describe how they handled that situation,and process healthier ways to regain balance in the future. Group members were asked to share the most important tool for maintaining balance that they learned while at [facility] and how they plan to apply this method after discharge. [name] [name] slept through majority of group. She struggled to stay awake but did attempt to participate. [name] [name] processed how she can work on Pharmacologist such as mending family relationship/communication, and dealing with negative emotions directly rather than trying to cover them up with alcohol.    Smart, [name] [name] 11/28/2012, 2:48 PM

## 2012-11-28 NOTE — Progress Notes (Signed)
NUTRITION ASSESSMENT Patient meets criteria for severe malnutrition related to social/environmental causes AEB 31% weight loss in the past year and a decrease of body fat and muscle mass.  Pt identified as at risk on the Malnutrition Screen Tool  INTERVENTION: 1. Educated patient on the importance of nutrition and encouraged intake of food and beverages. 2. Discussed weight goals. 3. Supplements:  Ensure Complete po BID, each supplement provides 350 kcal and 13 grams of protein. 4.  Continue thiamine, vitamin B-12, vitamin D and MVI daily  NUTRITION DIAGNOSIS: Malnutrition related to sub-optimal intake AEB 31% weight loss over the past year and decreased muscle mass and body fat.  Goal: Pt to meet >/= 90% of their estimated nutrition needs.  Monitor:  PO intake  Assessment:  Patient known from last admit with assessment 6/6.  Diagnosed with Severe Malnutrition at that time related to social/environmental causes AEB 31% weight loss in the past year, decreased body fat and muscle mass.  Patient reports that she is now eating well with good appetite and was eating well prior to admit.  Weight has been maintained but remains underweight.  Agreeable to Ensure.  57 y.o. female  Height: Ht Readings from Last 1 Encounters:  11/26/12 5\' 3"  (1.6 m)    Weight: Wt Readings from Last 1 Encounters:  11/26/12 84 lb (38.102 kg)    Weight Hx: Wt Readings from Last 10 Encounters:  11/26/12 84 lb (38.102 kg)  11/12/12 93 lb 4.8 oz (42.321 kg)  11/06/12 83 lb (37.649 kg)  11/06/12 83 lb (37.649 kg)  11/06/12 83 lb 12.8 oz (38.011 kg)  10/30/12 83 lb 3.2 oz (37.739 kg)  10/08/12 87 lb 6.4 oz (39.644 kg)  08/14/12 83 lb 11.2 oz (37.966 kg)  08/09/12 99 lb 6.8 oz (45.1 kg)  08/07/12 99 lb 6.4 oz (45.088 kg)    BMI:  Body mass index is 14.88 kg/(m^2). Pt meets criteria for underweight based on current BMI.  Estimated Nutritional Needs: Kcal: 25-30 kcal/kg Protein: > 1 gram  protein/kg Fluid: 1 ml/kcal  Diet Order: General Pt is also offered choice of unit snacks mid-morning and mid-afternoon.  Pt is eating as desired.   Lab results and medications reviewed.   Oran Rein, RD, LDN Clinical Inpatient Dietitian Pager:  [phone] Weekend and after hours pager:  [phone]

## 2012-11-29 ENCOUNTER — Inpatient Hospital Stay (HOSPITAL_COMMUNITY): Payer: 59

## 2012-11-29 ENCOUNTER — Encounter (HOSPITAL_COMMUNITY): Payer: Self-pay | Admitting: Emergency Medicine

## 2012-11-29 LAB — GLUCOSE, CAPILLARY: Glucose-Capillary: 92 mg/dL (ref 70–99)

## 2012-11-29 MED ORDER — OXYCODONE-ACETAMINOPHEN 5-325 MG PO TABS: 1.0000 | ORAL_TABLET | Freq: Four times a day (QID) | ORAL | Status: DC | PRN

## 2012-11-29 MED ORDER — OXYCODONE-ACETAMINOPHEN 5-325 MG PO TABS
2.0000 | ORAL_TABLET | Freq: Once | ORAL | Status: AC
  Administered 2012-11-29: 2 via ORAL
  Filled 2012-11-29: qty 2

## 2012-11-29 MED ORDER — TRAMADOL HCL 50 MG PO TABS
50.0000 mg | ORAL_TABLET | Freq: Four times a day (QID) | ORAL | Status: DC | PRN
  Administered 2012-11-29 – 2012-11-30 (×2): 50 mg via ORAL
  Filled 2012-11-29 (×3): qty 1

## 2012-11-29 MED ORDER — METHOCARBAMOL 500 MG PO TABS: 250.0000 mg | ORAL_TABLET | Freq: Four times a day (QID) | ORAL | Status: DC | PRN

## 2012-11-29 MED ORDER — CALCIUM CARBONATE 1250 (500 CA) MG PO TABS
1.0000 | ORAL_TABLET | Freq: Three times a day (TID) | ORAL | Status: DC
  Administered 2012-11-29 (×3): 500 mg via ORAL
  Filled 2012-11-29 (×10): qty 1

## 2012-11-29 NOTE — Progress Notes (Signed)
Patient assessed periodically today and patient has been resting and respirations were even and unlabored; patient assessed at 1750 and patient ambulated to the bathroom with cane and one assist; patient reports being very sleepy; patient is alert and oriented to person, place and time; patient able to respond to all commands  and  performed grip checks, and motor strength and response checked;  and she was positive for upper left side weakness; patient sling readjusted; patient position adjusted in bed with two nurse assist; patient consumed all medications with no complications with swallowing and patient consumed about 15% of her meal; patient has no other complications at this time

## 2012-11-29 NOTE — ED Notes (Signed)
[id number]<AV> Expected date:<BR> Expected time:<BR> Means of arrival:<BR> Comments:<BR> From [facility]-fall

## 2012-11-29 NOTE — Progress Notes (Signed)
See 1:1 note in MHT flow sheet

## 2012-11-29 NOTE — ED Provider Notes (Addendum)
History    CSN: [id number] Arrival date & time 11/29/12  0619  First MD Initiated Contact with Patient 11/29/12 [phone]     Chief Complaint  Patient presents with  . Fall   (Consider location/radiation/quality/duration/timing/severity/associated sxs/prior Treatment) The history is provided by the patient.  [name] [name] is a 57 y.o. female history of anemia, alcohol abuse in detox now here with fall. She slipped and fell while going to the bathroom this morning. She hit L shoulder and the back of her head. She is complaining of L shoulder pain and mild back pain. Denies headaches or vomiting.     Past Medical History  Diagnosis Date  . Anemia, B12 deficiency   . History of acute pancreatitis   . Right knee pain     No recent imaging on chart  . 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 Dr. Su Hilt, [facility]  . Hypertriglyceridemia   . GERD (gastroesophageal reflux disease)   . Vitamin D deficiency   . Subdural hematoma 02/2008    Likely 2/2 trauma from seizure from EtOH withdrawal, chronic in nature, sees Dr. Robyne Askew. Most recent CT head 10/2009 showing stable but persistent hematoma without mass effect.  . History of seizure disorder     Likely 2/2 alcohol abuse  . Hypocalcemia   . Hypomagnesemia   . Failure to thrive in childhood     Unclear etiology  . HTN (hypertension)   . Thrombocytopenia   . Anemia, macrocytic   . Hepatomegaly     On exam  . Alcohol abuse   . Joint pain   . Alcohol abuse   . Arthritis   . Vitamin D deficiency   . Menopause   . Pancreatitis   . Insomnia   . Hyperlipidemia   . Sinusitis   . Pernicious anemia   . Subdural hematoma   . Macrocytic anemia   . Hepatomegaly   . 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 Seizures     1.5 YRS  LAST ONE  . Depression   . Fx humeral neck 04/17/2011    Transverse fracture- minimally displaced- managed as outpatient   . ABNORMAL PAP  SMEAR, LGSIL 07/23/2008    Annotation: HPV positive CIN I Dr. Su Hilt, [facility] Qualifier: Diagnosis of  By: Danae Chen    . Pneumonia 05/20/2012  . Hepatitis     HEPATOMEGALY    Past Surgical History  Procedure Laterality Date  . Cesarean section  1983  . Esophagogastroduodenoscopy  07/11/2011    Procedure: ESOPHAGOGASTRODUODENOSCOPY (EGD);  Surgeon: Theda Belfast, MD;  Location: [facility];  Service: Endoscopy;  Laterality: N/A;  . Colonoscopy  07/11/2011    Procedure: COLONOSCOPY;  Surgeon: Theda Belfast, MD;  Location: [facility];  Service: Endoscopy;  Laterality: N/A;  . Eye surgery      LEFT EYE YRS AGO   . Rt colectomy  08/28/2011   Family History  Problem Relation Age of Onset  . Cancer Mother     Died from stomach cancer and "flesh eating rash  . Heart failure Father     Died in 6s from an MI  . Alcohol abuse Sister     Twin sister drinks a lot, as did both her parents and brothers  . Stroke Brother     Has 7 brothers, 1 with CVA   History  Substance Use Topics  .  Smoking status: Former Smoker    Types: Cigarettes    Quit date: 09/20/2010  . Smokeless tobacco: Never Used  . Alcohol Use: 6.0 oz/week    10 Glasses of wine per week     Comment: No alocohol x 3 weeks   OB History   Grav Para Term Preterm Abortions TAB SAB Ect Mult Living                 Review of Systems  Musculoskeletal:       L shoulder and back pain   All other systems reviewed and are negative.    Allergies  Amitriptyline hcl and Doxycycline hyclate  Home Medications   Current Outpatient Rx  Name  Route  Sig  Dispense  Refill  . acetaminophen (TYLENOL) 500 MG tablet   Oral   Take 500 mg by mouth at bedtime as needed for pain (sleep).         Marland Kitchen amLODipine (NORVASC) 10 MG tablet   Oral   Take 0.5 tablets (5 mg total) by mouth daily. For hypertension   30 tablet   3   . amylase-lipase-protease (PANGESTYME EC) 20-4.5-25 MU per capsule   Oral    Take 1 capsule by mouth 3 (three) times daily with meals. For low pancreatic function   90 capsule   4   . aspirin EC 81 MG tablet   Oral   Take 81 mg by mouth every evening.         . calcium carbonate (TUMS) 500 MG chewable tablet   Oral   Chew 6 tablets (1,200 mg of elemental calcium total) by mouth 3 (three) times daily. For bone health   90 tablet   3   . ferrous sulfate 325 (65 FE) MG tablet   Oral   Take 325 mg by mouth 2 (two) times daily with a meal.         . FLUoxetine (PROZAC) 10 MG capsule   Oral   Take 1 capsule (10 mg total) by mouth daily. For depression   30 capsule   0   . folic acid (FOLVITE) 1 MG tablet   Oral   Take 1 tablet (1 mg total) by mouth daily. For folic acid replacement   30 tablet   1   . furosemide (LASIX) 20 MG tablet   Oral   Take 1 tablet (20 mg total) by mouth daily.   30 tablet   1   . gabapentin (NEURONTIN) 300 MG capsule   Oral   Take 2 capsules (600 mg total) by mouth 3 (three) times daily. For anxiety/pain control   90 capsule   2   . hydrochlorothiazide (HYDRODIURIL) 25 MG tablet   Oral   Take 1 tablet (25 mg total) by mouth daily. For high blood pressure control   30 tablet   3   . magnesium oxide (MAG-OX) 400 MG tablet   Oral   Take 400 mg by mouth 3 (three) times daily.         . Multiple Vitamin (MULTIVITAMIN WITH MINERALS) TABS   Oral   Take 1 tablet by mouth daily. For vitamin replacement   30 tablet   11   . Nutritional Supplements (ENSURE HIGH PROTEIN PO)   Oral   Take 1 Can by mouth 3 (three) times daily. chocolate         . omeprazole (PRILOSEC) 40 MG capsule   Oral   Take 1 capsule (40 mg total)  by mouth daily.   30 capsule   3   . potassium chloride SA (KLOR-CON M20) 20 MEQ tablet   Oral   Take 1 tablet (20 mEq total) by mouth daily. For low potassium         . thiamine (VITAMIN B-1) 100 MG tablet   Oral   Take 1 tablet (100 mg total) by mouth daily. For low thiamine   90  tablet   3   . vitamin B-12 (CYANOCOBALAMIN) 250 MCG tablet   Oral   Take 250 mcg by mouth every evening.         . Vitamin D, Ergocalciferol, (DRISDOL) 50000 UNITS CAPS   Oral   Take 1 capsule (50,000 Units total) by mouth every 7 (seven) days. Thursdays: For bone health   30 capsule      . VOLTAREN 1 % GEL   Topical   Apply 1 application topically 3 (three) times daily as needed (pain).           BP 116/82  Pulse 94  Temp(Src) 100.2 F (37.9 C) (Oral)  Resp 20  Ht 5\' 3"  (1.6 m)  Wt 84 lb (38.102 kg)  BMI 14.88 kg/m2  SpO2 94% Physical Exam  Nursing note and vitals reviewed. Constitutional: She is oriented to person, place, and time.  Uncomfortable, holding L arm   HENT:  Head: Normocephalic and atraumatic.  Mouth/Throat: Oropharynx is clear and moist.  No scalp hematoma   Eyes: Conjunctivae are normal. Pupils are equal, round, and reactive to light.  Neck: Normal range of motion. Neck supple.  Cardiovascular: Normal rate, regular rhythm and normal heart sounds.   Pulmonary/Chest: Effort normal and breath sounds normal. No respiratory distress. She has no wheezes. She has no rales.  Abdominal: Soft. Bowel sounds are normal. She exhibits no distension. There is no tenderness. There is no rebound and no guarding.  Musculoskeletal:  L shoulder with obvious deformity. Unable to fully range the shoulder due to pain. Nl sensation L shoulder and distally. Nl ROM L elbow and wrist. Nl hand grasp. 2+ pulses. No spinal tenderness. Pelvis stable. Hips nl ROM bilaterally   Neurological: She is alert and oriented to person, place, and time.  Skin: Skin is warm and dry.  Psychiatric: She has a normal mood and affect. Her behavior is normal. Judgment and thought content normal.    ED Course  Procedures (including critical care time) Labs Reviewed - No data to display Dg Shoulder Left  11/29/2012   *RADIOLOGY REPORT*  Clinical Data: Larey Seat this morning with severe shoulder pain  and decreased range of motion  LEFT SHOULDER - 2+ VIEW  Comparison: None.  Findings: There is a slightly angulated impacted fracture of the left humeral neck.  The left humeral head remains in normal position.  The left [facility] joint is normally aligned.  The ribs are visualized are intact.  IMPRESSION: Slightly angulated and impacted fracture of the left humeral neck.   Original Report Authenticated By: Dwyane Dee, M.D.   1. Alcohol dependence   2. Depressive disorder, not elsewhere classified   3. Alcohol abuse, unspecified     MDM  MALKIA NIPPERT is a 57 y.o. female here with L shoulder pain. Has L humeral neck fracture with no neurovascular compromise. Will place in shoulder immobilizer and give ortho f/u. She will need some pain meds. She has no scalp hematoma and not anticoagulated and has normal neuro exam so CT head not necessary.  Richardean Canal, MD 11/29/12 8119  Richardean Canal, MD 11/29/12 [phone]

## 2012-11-29 NOTE — ED Notes (Signed)
Pt from behavioral health for detox. Pt fell and hit her head, c/o pain in back of head and left shoulder.

## 2012-11-29 NOTE — Progress Notes (Signed)
[facility] Adult Case Management Discharge Plan :  Will you be returning to the same living situation after discharge: Yes,  home with her friend At discharge, do you have transportation home?:Yes,  daughter Asher Muir contacted today. She will pick up pt at 2:00PM Sat.  Do you have the ability to pay for your medications:Yes,  [employer] of information consent forms completed and in the chart;  Patient's signature needed at discharge.  Patient to Follow up at: Follow-up Information   Follow up with [facility] of Care. (Continue to see Harrold Donath every Mon, Wed, and Fri for Substance Abuse IOP. Hospital followup with psychatrist)    Contact information:   [address], [place], [place] [address] Phone: [phone] Fax: [phone]      Patient denies SI/HI:   Yes,  in group    Safety Planning and Suicide Prevention discussed:  Yes,  pt did not endorse SI during admission or during stay at [facility]. SPE not required.   Smart, Ryson Bacha 11/29/2012, 3:35 PM

## 2012-11-29 NOTE — [facility] Group Notes (Signed)
[facility] LCSW Group Therapy  11/29/2012 2:41 PM  Type of Therapy:  Group Therapy  Participation Level:  Did Not Attend  Smart, Herbert Seta 11/29/2012, 2:41 PM

## 2012-11-29 NOTE — ED Notes (Signed)
Applied shoulder immobilizer L arm

## 2012-11-29 NOTE — Progress Notes (Signed)
Patient returned to the unit at 0930 hrs and patient was escorted by cna and security; patient appeared to be very sleepy at this time; patient was taken to her bed and patient has been quietly resting most of  the day; patient denies  SI/HI and A/V hallucinations;  patient reports that she has not been able to get comfortable the way she would like; patient has not reported any excruciating pain and only has reported not being totally comfortable  Patient has been offered fluids and food; patient has been encouraged to express feelings or concerns; patient encouraged to use cane and it has been left at the bedside and it is within reach; patient also has the long call bell that has been left within reach of the patient; patient has been rotated in the bed to allow for some comfort  Patient is in the bed resting quietly; patient has no other complaints at this time;patient has consumed all medications as prescribed; patient has not attended any meals or groups today

## 2012-11-29 NOTE — Progress Notes (Signed)
At around [facility] heard pt fall in her bathroom   When mht found her she was laying flat on her back wedged between the toilet and the shower   She said she hit her shoulder ,head ,back and leg and all of those areas hurt   She is lethargic and weak  Pt was assissted to her bed, vitals taken 108/73 r 20, p102,temp 99.1  PA notified and came to examine pt and requested transfer to [facility] for further evaluation and to rule out trauma  Notified ed and ems transport and currently awaiting transport to arrive  Staff sitting at bedside with patient and she is presently safe

## 2012-11-29 NOTE — Progress Notes (Signed)
[name], female   DOB: May 08, 1956, 57 y.o.   MRN: [id number] [facility] MD Progress Note  11/29/2012 2:10 PM [name] [name]  MRN:  [id number]  Subjective:  Mr. Wander reports today that she is hurting real bad to her left elbow/shoulder. Apparently she sustained a fall last night while attempting to use the bathroom. Was taken to the ED whereby an Xray report indicated an impacted left shoulder fracture. Does not require surgery at this time. [name] was admitted with an unsteady gait/balance on admission. Was noted to be at risks for fall upon admission. She was ordered a PT consult. Was evaluated by a physical therapist and recommended a cane. Cane provided for patient as recommended. Patient is currently in bed with a left shoulder sling. She complains of pain to left shoulder area. Says her mood is still improving. May have treament center placement problems as a result of this fall and related injury problems.  Diagnosis:   Axis I: Alcohol dependence, Pancreatitis Axis II: Deferred Axis III:  Past Medical History  Diagnosis Date  . Anemia, B12 deficiency   . History of acute pancreatitis   . Right knee pain     No recent imaging on chart  . 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 Dr. Su Hilt, [facility]  . Hypertriglyceridemia   . GERD (gastroesophageal reflux disease)   . Vitamin D deficiency   . Subdural hematoma 02/2008    Likely 2/2 trauma from seizure from EtOH withdrawal, chronic in nature, sees Dr. Robyne Askew. Most recent CT head 10/2009 showing stable but persistent hematoma without mass effect.  . History of seizure disorder     Likely 2/2 alcohol abuse  . Hypocalcemia   . Hypomagnesemia   . Failure to thrive in childhood     Unclear etiology  . HTN (hypertension)   . Thrombocytopenia   . Anemia, macrocytic   . Hepatomegaly     On exam  . Alcohol abuse   . Joint pain   . Alcohol  abuse   . Arthritis   . Vitamin D deficiency   . Menopause   . Pancreatitis   . Insomnia   . Hyperlipidemia   . Sinusitis   . Pernicious anemia   . Subdural hematoma   . Macrocytic anemia   . Hepatomegaly   . 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 Seizures     1.5 YRS  LAST ONE  . Depression   . Fx humeral neck 04/17/2011    Transverse fracture- minimally displaced- managed as outpatient   . ABNORMAL PAP SMEAR, LGSIL 07/23/2008    Annotation: HPV positive CIN I Dr. Su Hilt, [facility] Qualifier: Diagnosis of  By: Danae Chen    . Pneumonia 05/20/2012  . Hepatitis     HEPATOMEGALY    Axis IV: Alcoholism, chronic health issues Axis V: 41-50 serious symptoms  ADL's:  Intact  Sleep: Fair per patient reports, 6.5 hours per documentation.  Appetite:  Fair  Suicidal Ideation:  Plan:  Denies Intent:  Denies Means:  Denies Homicidal Ideation:  Plan:  Denies Intent:  Denies Means:  Denies  AEB (as evidenced by):  Psychiatric Specialty Exam: Review of Systems  Constitutional: Positive for chills and malaise/fatigue.  HENT: Negative.   Eyes: Negative.   Respiratory: Negative.   Cardiovascular: Negative.   Gastrointestinal: Negative.   Genitourinary: Negative.   Musculoskeletal:  Positive for myalgias, joint pain and falls (Sustained a fall last night 11/28/12).  Skin: Negative.   Neurological: Positive for tremors and weakness.  Endo/Heme/Allergies: Negative.   Psychiatric/Behavioral: Positive for substance abuse (Alcoholism). Negative for depression, suicidal ideas, hallucinations and memory loss. The patient is nervous/anxious (Currently being stabilized with medication) and has insomnia (Currently being stabilized with medication).     Blood pressure 108/71, pulse 110, temperature 100.2 F (37.9 C), temperature source Oral, resp. rate 16, height 5\' 3"  (1.6 m), weight 38.102 kg (84 lb), SpO2 99.00%.Body mass index is 14.88 kg/(m^2).  General  Appearance: Casual and Thin, frail  Eye Contact::  Fair  Speech:  Clear and Coherent  Volume:  Normal  Mood:  "My mood is improving"  Affect:  Flat, worried  Thought Process:  Coherent and Goal Directed  Orientation:  Full (Time, Place, and Person)  Thought Content:  Rumination  Suicidal Thoughts:  No  Homicidal Thoughts:  No  Memory:  Immediate;   Good Recent;   Good Remote;   Fair  Judgement:  Fair  Insight:  Fair  Psychomotor Activity:  Restlessness and Tremor  Concentration:  Fair  Recall:  Good  Akathisia:  No  Handed:  Right  AIMS (if indicated):     Assets:  Desire for Improvement  Sleep:  Number of Hours: 6.5   Current Medications: Current Facility-Administered Medications  Medication Dose Route Frequency Provider Last Rate Last Dose  . acetaminophen (TYLENOL) tablet 650 mg  650 mg Oral Q6H PRN Sanjuana Kava, NP      . alum & mag hydroxide-simeth (MAALOX/MYLANTA) 200-200-20 MG/5ML suspension 30 mL  30 mL Oral Q4H PRN Sanjuana Kava, NP      . amLODipine (NORVASC) tablet 5 mg  5 mg Oral Daily Sanjuana Kava, NP   5 mg at 11/29/12 0815  . aspirin EC tablet 81 mg  81 mg Oral QPM Sanjuana Kava, NP   81 mg at 11/28/12 1712  . calcium carbonate (OS-CAL - dosed in mg of elemental calcium) tablet 500 mg of elemental calcium  1 tablet Oral TID WC Rachael Fee, MD   500 mg of elemental calcium at 11/29/12 1229  . chlordiazePOXIDE (LIBRIUM) capsule 25 mg  25 mg Oral Q6H PRN Sanjuana Kava, NP   25 mg at 11/28/12 2120  . chlordiazePOXIDE (LIBRIUM) capsule 25 mg  25 mg Oral BH-qamhs Sanjuana Kava, NP   25 mg at 11/29/12 0816   Followed by  . [START ON 11/30/2012] chlordiazePOXIDE (LIBRIUM) capsule 25 mg  25 mg Oral Daily Sanjuana Kava, NP      . diclofenac sodium (VOLTAREN) 1 % transdermal gel 1 application  1 application Topical TID PRN Sanjuana Kava, NP      . feeding supplement (ENSURE COMPLETE) liquid 237 mL  237 mL Oral BID BM Jeoffrey Massed, RD   237 mL at 11/28/12 1715  .  ferrous sulfate tablet 325 mg  325 mg Oral BID WC Sanjuana Kava, NP   325 mg at 11/29/12 4098  . FLUoxetine (PROZAC) capsule 10 mg  10 mg Oral Daily Sanjuana Kava, NP   10 mg at 11/29/12 0816  . folic acid (FOLVITE) tablet 1 mg  1 mg Oral Daily Sanjuana Kava, NP   1 mg at 11/29/12 0817  . furosemide (LASIX) tablet 20 mg  20 mg Oral Daily Sanjuana Kava, NP   20 mg at 11/29/12 0816  .  gabapentin (NEURONTIN) capsule 600 mg  600 mg Oral TID Sanjuana Kava, NP   600 mg at 11/29/12 1226  . hydrochlorothiazide (HYDRODIURIL) tablet 25 mg  25 mg Oral Daily Sanjuana Kava, NP   25 mg at 11/29/12 0816  . hydrOXYzine (ATARAX/VISTARIL) tablet 25 mg  25 mg Oral Q6H PRN Sanjuana Kava, NP      . lipase/protease/amylase (CREON-12/PANCREASE) capsule 1 capsule  1 capsule Oral TID AC Sanjuana Kava, NP   1 capsule at 11/29/12 0815  . loperamide (IMODIUM) capsule 2-4 mg  2-4 mg Oral PRN Sanjuana Kava, NP   2 mg at 11/27/12 1938  . magnesium hydroxide (MILK OF MAGNESIA) suspension 30 mL  30 mL Oral Daily PRN Sanjuana Kava, NP      . magnesium oxide (MAG-OX) tablet 400 mg  400 mg Oral TID Sanjuana Kava, NP   400 mg at 11/29/12 1226  . multivitamin with minerals tablet 1 tablet  1 tablet Oral Daily Sanjuana Kava, NP   1 tablet at 11/29/12 0817  . ondansetron (ZOFRAN-ODT) disintegrating tablet 4 mg  4 mg Oral Q6H PRN Sanjuana Kava, NP      . pantoprazole (PROTONIX) EC tablet 80 mg  80 mg Oral Daily Sanjuana Kava, NP   80 mg at 11/29/12 0815  . potassium chloride SA (K-DUR,KLOR-CON) CR tablet 20 mEq  20 mEq Oral Daily Sanjuana Kava, NP   20 mEq at 11/29/12 0815  . thiamine (B-1) injection 100 mg  100 mg Intramuscular Once Sanjuana Kava, NP      . thiamine (VITAMIN B-1) tablet 100 mg  100 mg Oral Daily Sanjuana Kava, NP   100 mg at 11/29/12 0817  . traZODone (DESYREL) tablet 50 mg  50 mg Oral QHS PRN Sanjuana Kava, NP   50 mg at 11/28/12 2120  . vitamin B-12 (CYANOCOBALAMIN) tablet 250 mcg  250 mcg Oral QPM Sanjuana Kava, NP    250 mcg at 11/28/12 1714  . Vitamin D (Ergocalciferol) (DRISDOL) capsule 50,000 Units  50,000 Units Oral Q7 days Sanjuana Kava, NP   50,000 Units at 11/28/12 1109    Lab Results:  Results for orders placed during the hospital encounter of 11/26/12 (from the past 48 hour(s))  GLUCOSE, CAPILLARY     Status: None   Collection Time    11/29/12  6:24 AM      Result Value Range   Glucose-Capillary 92  70 - 99 mg/dL   Comment 1 Notify RN     Comment 2 Documented in Chart      Physical Findings: AIMS: Facial and Oral Movements Muscles of Facial Expression: None, normal Lips and Perioral Area: None, normal Jaw: None, normal Tongue: None, normal,Extremity Movements Upper (arms, wrists, hands, fingers): None, normal Lower (legs, knees, ankles, toes): None, normal, Trunk Movements Neck, shoulders, hips: None, normal, Overall Severity Severity of abnormal movements (highest score from questions above): None, normal Incapacitation due to abnormal movements: None, normal Patient's awareness of abnormal movements (rate only patient's report): No Awareness, Dental Status Current problems with teeth and/or dentures?: No Does patient usually wear dentures?: No  CIWA:  CIWA-Ar Total: 0 COWS:  COWS Total Score: 2  Treatment Plan Summary: Daily contact with patient to assess and evaluate symptoms and progress in treatment Medication management  Plan: Supportive approach/coping skills/relapse prevention. Start Tramadol 50 mg Q 6 hours prn, and Robaxin 250 mg Q 6 hours prn for  muscle. Encouraged out of room, participation in group sessions and application of coping skills when distressed. Will continue to monitor response to/adverse effects of medications in use to assure effectiveness. Continue to monitor mood, behavior and interaction with staff and other patients. Continue current plan of care.  Medical Decision Making Problem Points:  Established problem, stable/improving (1), Review of last  therapy session (1) and Review of psycho-social stressors (1) Data Points:  Review of medication regiment & side effects (2) Review of new medications or change in dosage (2)  I certify that inpatient services furnished can reasonably be expected to improve the patient's condition.   Armandina Stammer I, PMHNP, FNP-BC 11/29/2012, 2:10 PM

## 2012-11-29 NOTE — Progress Notes (Signed)
Adult Psychoeducational Group Note  Date:  11/29/2012 Time:  11:17 AM  Group Topic/Focus:  Relapse Prevention Planning:   The focus of this group is to define relapse and discuss the need for planning to combat relapse.  Participation Level:  Did Not Attend  Participation Quality:  did not attend  Affect:  did not attend  Cognitive:  did not attend  Insight: None  Engagement in Group:  did not attend  Modes of Intervention:  Did not attend  Additional Comments:  Patient remained in bed asleep during group.   Lyndee Hensen 11/29/2012, 11:17 AM

## 2012-11-29 NOTE — Progress Notes (Signed)
Patient did attend the evening karaoke group.  

## 2012-11-29 NOTE — ED Notes (Signed)
Pt complains of L side shoulder, leg and back pain.  No deformities present, no bruising noted.

## 2012-11-29 NOTE — Progress Notes (Signed)
S: Called to bed side this am, Pt with unobserved fall in her bathroom with subsequent blunt trauma of Left shoulder against commode and closed head trauma with head hitting the floor. Patient does endorse some dizziness prior to her fall, but denies any LOC. Pt denies any CP, SOB, N/V, fever, chills or Abd pains. Pt admitted with ETOH abuse but with notable multiple co morbid conditions.  0: V/s:  T 99.1,( lying 108/73, pulse 102) (sitting 109/74 pulse 108,) R 20, BG: 92 mg/dl  Gen: Cachetic malnourished appearing, looking older than stated age AAF in moderate discomfort  Integument: W/D with fair turgor, no break in skin, hematoma, lacerations or bleeding noted  Pulm: CTA  Cardio: audible S1,S2  HEENT: Normocephalic,  PERRL bilat  Neuro: A & 0 x 3 spheres, CN 2 - 12 grossly intact  M/S: no active ROM  obtainable with mvmt of Left shoulder/LUE   A/P:  Syncopal episode/fall  1) Transfer to [facility] ED via EMS for further evaluation of syncopal event and subsequent closed head trauma and Left shoulder injury. 2) check orthostatics 3) check BG  Hx S/z d/o HTN Alcohol abuse Arthralgia Malnutrition Chronic pancreatitis HLD H/o of SDH H/o TB Depression H/o of Humeral neck Fx

## 2012-11-30 ENCOUNTER — Inpatient Hospital Stay (HOSPITAL_COMMUNITY): Payer: 59

## 2012-11-30 ENCOUNTER — Encounter (HOSPITAL_COMMUNITY): Payer: Self-pay | Admitting: *Deleted

## 2012-11-30 ENCOUNTER — Inpatient Hospital Stay (HOSPITAL_COMMUNITY)
Admission: AD | Admit: 2012-11-30 | Discharge: 2012-12-10 | DRG: 870 | Disposition: A | Discharge status: Home Health | Payer: PRIVATE HEALTH INSURANCE | Source: Other Acute Inpatient Hospital | Attending: Internal Medicine | Admitting: Internal Medicine

## 2012-11-30 DIAGNOSIS — K7010 Alcoholic hepatitis without ascites: Secondary | ICD-10-CM | POA: Diagnosis present

## 2012-11-30 DIAGNOSIS — S42213A Unspecified displaced fracture of surgical neck of unspecified humerus, initial encounter for closed fracture: Secondary | ICD-10-CM | POA: Diagnosis present

## 2012-11-30 DIAGNOSIS — K219 Gastro-esophageal reflux disease without esophagitis: Secondary | ICD-10-CM | POA: Diagnosis present

## 2012-11-30 DIAGNOSIS — A419 Sepsis, unspecified organism: Principal | ICD-10-CM | POA: Diagnosis present

## 2012-11-30 DIAGNOSIS — J9600 Acute respiratory failure, unspecified whether with hypoxia or hypercapnia: Secondary | ICD-10-CM

## 2012-11-30 DIAGNOSIS — S129XXA Fracture of neck, unspecified, initial encounter: Secondary | ICD-10-CM | POA: Diagnosis present

## 2012-11-30 DIAGNOSIS — E43 Unspecified severe protein-calorie malnutrition: Secondary | ICD-10-CM | POA: Diagnosis present

## 2012-11-30 DIAGNOSIS — N179 Acute kidney failure, unspecified: Secondary | ICD-10-CM | POA: Diagnosis present

## 2012-11-30 DIAGNOSIS — Z87891 Personal history of nicotine dependence: Secondary | ICD-10-CM

## 2012-11-30 DIAGNOSIS — F1010 Alcohol abuse, uncomplicated: Secondary | ICD-10-CM

## 2012-11-30 DIAGNOSIS — I10 Essential (primary) hypertension: Secondary | ICD-10-CM | POA: Diagnosis present

## 2012-11-30 DIAGNOSIS — E872 Acidosis, unspecified: Secondary | ICD-10-CM | POA: Diagnosis present

## 2012-11-30 DIAGNOSIS — E162 Hypoglycemia, unspecified: Secondary | ICD-10-CM | POA: Diagnosis present

## 2012-11-30 DIAGNOSIS — F1020 Alcohol dependence, uncomplicated: Secondary | ICD-10-CM | POA: Diagnosis present

## 2012-11-30 DIAGNOSIS — J690 Pneumonitis due to inhalation of food and vomit: Secondary | ICD-10-CM | POA: Diagnosis present

## 2012-11-30 DIAGNOSIS — K226 Gastro-esophageal laceration-hemorrhage syndrome: Secondary | ICD-10-CM | POA: Diagnosis present

## 2012-11-30 DIAGNOSIS — J811 Chronic pulmonary edema: Secondary | ICD-10-CM | POA: Diagnosis present

## 2012-11-30 DIAGNOSIS — D518 Other vitamin B12 deficiency anemias: Secondary | ICD-10-CM | POA: Diagnosis present

## 2012-11-30 DIAGNOSIS — R6520 Severe sepsis without septic shock: Secondary | ICD-10-CM | POA: Diagnosis present

## 2012-11-30 DIAGNOSIS — E875 Hyperkalemia: Secondary | ICD-10-CM | POA: Diagnosis present

## 2012-11-30 DIAGNOSIS — F3289 Other specified depressive episodes: Secondary | ICD-10-CM | POA: Diagnosis present

## 2012-11-30 DIAGNOSIS — F329 Major depressive disorder, single episode, unspecified: Secondary | ICD-10-CM

## 2012-11-30 DIAGNOSIS — E871 Hypo-osmolality and hyponatremia: Secondary | ICD-10-CM | POA: Diagnosis present

## 2012-11-30 DIAGNOSIS — J90 Pleural effusion, not elsewhere classified: Secondary | ICD-10-CM | POA: Diagnosis present

## 2012-11-30 DIAGNOSIS — D62 Acute posthemorrhagic anemia: Secondary | ICD-10-CM | POA: Diagnosis present

## 2012-11-30 DIAGNOSIS — K861 Other chronic pancreatitis: Secondary | ICD-10-CM | POA: Diagnosis present

## 2012-11-30 DIAGNOSIS — E559 Vitamin D deficiency, unspecified: Secondary | ICD-10-CM | POA: Diagnosis present

## 2012-11-30 DIAGNOSIS — D509 Iron deficiency anemia, unspecified: Secondary | ICD-10-CM | POA: Diagnosis present

## 2012-11-30 DIAGNOSIS — W19XXXA Unspecified fall, initial encounter: Secondary | ICD-10-CM | POA: Diagnosis present

## 2012-11-30 DIAGNOSIS — K920 Hematemesis: Secondary | ICD-10-CM

## 2012-11-30 DIAGNOSIS — D72829 Elevated white blood cell count, unspecified: Secondary | ICD-10-CM | POA: Diagnosis present

## 2012-11-30 DIAGNOSIS — IMO0002 Reserved for concepts with insufficient information to code with codable children: Secondary | ICD-10-CM

## 2012-11-30 DIAGNOSIS — E8809 Other disorders of plasma-protein metabolism, not elsewhere classified: Secondary | ICD-10-CM

## 2012-11-30 DIAGNOSIS — E8770 Fluid overload, unspecified: Secondary | ICD-10-CM

## 2012-11-30 DIAGNOSIS — E876 Hypokalemia: Secondary | ICD-10-CM | POA: Diagnosis not present

## 2012-11-30 DIAGNOSIS — E781 Pure hyperglyceridemia: Secondary | ICD-10-CM | POA: Diagnosis present

## 2012-11-30 DIAGNOSIS — K922 Gastrointestinal hemorrhage, unspecified: Secondary | ICD-10-CM | POA: Diagnosis present

## 2012-11-30 LAB — COMPREHENSIVE METABOLIC PANEL WITH GFR
ALT: 8 U/L (ref 0–35)
AST: 31 U/L (ref 0–37)
Albumin: 2 g/dL — ABNORMAL LOW (ref 3.5–5.2)
Alkaline Phosphatase: 242 U/L — ABNORMAL HIGH (ref 39–117)
BUN: 40 mg/dL — ABNORMAL HIGH (ref 6–23)
CO2: 19 meq/L (ref 19–32)
Calcium: 9 mg/dL (ref 8.4–10.5)
Chloride: 100 meq/L (ref 96–112)
Creatinine, Ser: 2.46 mg/dL — ABNORMAL HIGH (ref 0.50–1.10)
GFR calc Af Amer: 24 mL/min — ABNORMAL LOW
GFR calc non Af Amer: 21 mL/min — ABNORMAL LOW
Glucose, Bld: 97 mg/dL (ref 70–99)
Potassium: 6.7 meq/L (ref 3.5–5.1)
Sodium: 130 meq/L — ABNORMAL LOW (ref 135–145)
Total Bilirubin: 0.2 mg/dL — ABNORMAL LOW (ref 0.3–1.2)
Total Protein: 8.7 g/dL — ABNORMAL HIGH (ref 6.0–8.3)

## 2012-11-30 LAB — URINALYSIS, ROUTINE W REFLEX MICROSCOPIC
Bilirubin Urine: NEGATIVE
Glucose, UA: NEGATIVE mg/dL
Hgb urine dipstick: NEGATIVE
Ketones, ur: NEGATIVE mg/dL
Leukocytes, UA: NEGATIVE
Nitrite: NEGATIVE
Protein, ur: 100 mg/dL — AB
Specific Gravity, Urine: 1.015 (ref 1.005–1.030)
Urobilinogen, UA: 0.2 mg/dL (ref 0.0–1.0)
pH: 5.5 (ref 5.0–8.0)

## 2012-11-30 LAB — CBC WITH DIFFERENTIAL/PLATELET
Basophils Absolute: 0 K/uL (ref 0.0–0.1)
Basophils Relative: 0 % (ref 0–1)
Eosinophils Absolute: 0 K/uL (ref 0.0–0.7)
Eosinophils Relative: 0 % (ref 0–5)
HCT: 23.6 % — ABNORMAL LOW (ref 36.0–46.0)
Hemoglobin: 7.4 g/dL — ABNORMAL LOW (ref 12.0–15.0)
Lymphocytes Relative: 16 % (ref 12–46)
Lymphs Abs: 3.2 K/uL (ref 0.7–4.0)
MCH: 33.5 pg (ref 26.0–34.0)
MCHC: 31.4 g/dL (ref 30.0–36.0)
MCV: 106.8 fL — ABNORMAL HIGH (ref 78.0–100.0)
Monocytes Absolute: 4.2 K/uL — ABNORMAL HIGH (ref 0.1–1.0)
Monocytes Relative: 21 % — ABNORMAL HIGH (ref 3–12)
Neutro Abs: 12.8 K/uL — ABNORMAL HIGH (ref 1.7–7.7)
Neutrophils Relative %: 63 % (ref 43–77)
Platelets: 184 K/uL (ref 150–400)
RBC: 2.21 MIL/uL — ABNORMAL LOW (ref 3.87–5.11)
RDW: 15.1 % (ref 11.5–15.5)
WBC: 20.2 K/uL — ABNORMAL HIGH (ref 4.0–10.5)

## 2012-11-30 LAB — BLOOD GAS, ARTERIAL
Acid-base deficit: 5.5 mmol/L — ABNORMAL HIGH (ref 0.0–2.0)
Acid-base deficit: 7 mmol/L — ABNORMAL HIGH (ref 0.0–2.0)
Bicarbonate: 19.8 meq/L — ABNORMAL LOW (ref 20.0–24.0)
Drawn by: 31288
Drawn by: 308601
FIO2: 0.5 %
O2 Content: 2 L/min
O2 Saturation: 92 %
Patient temperature: 98.6
RATE: 16 resp/min
TCO2: 19.5 mmol/L (ref 0–100)
pCO2 arterial: 41.3 mmHg (ref 35.0–45.0)
pCO2 arterial: 31.4 mmHg — ABNORMAL LOW (ref 35.0–45.0)
pH, Arterial: 7.303 — ABNORMAL LOW (ref 7.350–7.450)
pH, Arterial: 7.357 (ref 7.350–7.450)
pO2, Arterial: 70.2 mmHg — ABNORMAL LOW (ref 80.0–100.0)
pO2, Arterial: 91.5 mmHg (ref 80.0–100.0)

## 2012-11-30 LAB — POCT I-STAT TROPONIN I: Troponin i, poc: 0 ng/mL (ref 0.00–0.08)

## 2012-11-30 LAB — APTT: aPTT: 40 s — ABNORMAL HIGH (ref 24–37)

## 2012-11-30 LAB — PROTIME-INR
INR: 1.25 (ref 0.00–1.49)
Prothrombin Time: 15.4 s — ABNORMAL HIGH (ref 11.6–15.2)

## 2012-11-30 LAB — POTASSIUM: Potassium: 4 meq/L (ref 3.5–5.1)

## 2012-11-30 LAB — ABO/RH: ABO/RH(D): A POS

## 2012-11-30 LAB — CG4 I-STAT (LACTIC ACID): Lactic Acid, Venous: 1.09 mmol/L (ref 0.5–2.2)

## 2012-11-30 LAB — URINE MICROSCOPIC-ADD ON

## 2012-11-30 LAB — GLUCOSE, CAPILLARY: Glucose-Capillary: 83 mg/dL (ref 70–99)

## 2012-11-30 MED ORDER — VANCOMYCIN HCL 500 MG IV SOLR
500.0000 mg | INTRAVENOUS | Status: DC
  Filled 2012-11-30: qty 500

## 2012-11-30 MED ORDER — SODIUM CHLORIDE 0.9 % IV SOLN: 250.0000 mL | INTRAVENOUS | Status: DC | PRN

## 2012-11-30 MED ORDER — FENTANYL CITRATE 0.05 MG/ML IJ SOLN
50.0000 ug | INTRAMUSCULAR | Status: DC | PRN
  Administered 2012-12-02 – 2012-12-05 (×7): 100 ug via INTRAVENOUS
  Filled 2012-11-30 (×9): qty 2

## 2012-11-30 MED ORDER — ROCURONIUM BROMIDE 50 MG/5ML IV SOLN
INTRAVENOUS | Status: AC
  Filled 2012-11-30: qty 2

## 2012-11-30 MED ORDER — SUCCINYLCHOLINE CHLORIDE 20 MG/ML IJ SOLN
INTRAMUSCULAR | Status: AC
  Filled 2012-11-30: qty 5

## 2012-11-30 MED ORDER — LIDOCAINE HCL (CARDIAC) 20 MG/ML IV SOLN
INTRAVENOUS | Status: AC
  Filled 2012-11-30: qty 5

## 2012-11-30 MED ORDER — VANCOMYCIN HCL 500 MG IV SOLR: 500.0000 mg | INTRAVENOUS | Status: DC

## 2012-11-30 MED ORDER — OCTREOTIDE LOAD VIA INFUSION
50.0000 ug | Freq: Once | INTRAVENOUS | Status: DC
  Filled 2012-11-30: qty 25

## 2012-11-30 MED ORDER — SODIUM CHLORIDE 0.9 % IV BOLUS (SEPSIS)
1000.0000 mL | Freq: Once | INTRAVENOUS | Status: DC
  Administered 2012-11-30: 1000 mL via INTRAVENOUS

## 2012-11-30 MED ORDER — DEXTROSE-NACL 5-0.9 % IV SOLN
INTRAVENOUS | Status: DC
  Administered 2012-12-01: via INTRAVENOUS

## 2012-11-30 MED ORDER — PANTOPRAZOLE SODIUM 40 MG IV SOLR: 40.0000 mg | Freq: Two times a day (BID) | INTRAVENOUS | Status: DC

## 2012-11-30 MED ORDER — INSULIN ASPART 100 UNIT/ML ~~LOC~~ SOLN
2.0000 [IU] | SUBCUTANEOUS | Status: DC
  Administered 2012-12-01: 4 [IU] via SUBCUTANEOUS
  Administered 2012-12-01 – 2012-12-02 (×3): 2 [IU] via SUBCUTANEOUS
  Administered 2012-12-02: 6 [IU] via SUBCUTANEOUS
  Administered 2012-12-02: 2 [IU] via SUBCUTANEOUS
  Administered 2012-12-03 (×2): 4 [IU] via SUBCUTANEOUS
  Administered 2012-12-03 – 2012-12-04 (×6): 2 [IU] via SUBCUTANEOUS
  Administered 2012-12-05: 4 [IU] via SUBCUTANEOUS
  Administered 2012-12-05 – 2012-12-06 (×3): 2 [IU] via SUBCUTANEOUS

## 2012-11-30 MED ORDER — BIOTENE DRY MOUTH MT LIQD: 15.0000 mL | Freq: Two times a day (BID) | OROMUCOSAL | Status: DC

## 2012-11-30 MED ORDER — PIPERACILLIN-TAZOBACTAM IN DEX 2-0.25 GM/50ML IV SOLN
2.2500 g | Freq: Three times a day (TID) | INTRAVENOUS | Status: DC
  Filled 2012-11-30: qty 50

## 2012-11-30 MED ORDER — DEXMEDETOMIDINE HCL IN NACL 200 MCG/50ML IV SOLN
0.4000 ug/kg/h | INTRAVENOUS | Status: DC
  Administered 2012-12-01: 0.6 ug/kg/h via INTRAVENOUS
  Administered 2012-12-01: 0.4 ug/kg/h via INTRAVENOUS
  Administered 2012-12-02: 0.8 ug/kg/h via INTRAVENOUS
  Administered 2012-12-02: 0.6 ug/kg/h via INTRAVENOUS
  Administered 2012-12-03 (×2): 0.4 ug/kg/h via INTRAVENOUS
  Administered 2012-12-04: 0.6 ug/kg/h via INTRAVENOUS
  Administered 2012-12-04: 0.4 ug/kg/h via INTRAVENOUS
  Administered 2012-12-05: 0.605 ug/kg/h via INTRAVENOUS
  Filled 2012-11-30 (×9): qty 50

## 2012-11-30 MED ORDER — NALOXONE HCL 0.4 MG/ML IJ SOLN: 0.4000 mg | Freq: Once | INTRAMUSCULAR | Status: DC

## 2012-11-30 MED ORDER — DEXMEDETOMIDINE HCL IN NACL 200 MCG/50ML IV SOLN
0.4000 ug/kg/h | INTRAVENOUS | Status: DC
  Filled 2012-11-30 (×2): qty 50

## 2012-11-30 MED ORDER — INSULIN ASPART 100 UNIT/ML ~~LOC~~ SOLN: 2.0000 [IU] | SUBCUTANEOUS | Status: DC

## 2012-11-30 MED ORDER — CHLORHEXIDINE GLUCONATE 0.12 % MT SOLN
15.0000 mL | Freq: Two times a day (BID) | OROMUCOSAL | Status: DC
  Filled 2012-11-30: qty 15

## 2012-11-30 MED ORDER — SODIUM CHLORIDE 0.9 % IV SOLN
25.0000 ug/h | INTRAVENOUS | Status: DC
  Administered 2012-11-30: 50 ug/h via INTRAVENOUS
  Filled 2012-11-30 (×2): qty 1

## 2012-11-30 MED ORDER — PIPERACILLIN-TAZOBACTAM 3.375 G IVPB
3.3750 g | Freq: Once | INTRAVENOUS | Status: AC
  Administered 2012-11-30: 3.375 g via INTRAVENOUS
  Filled 2012-11-30: qty 50

## 2012-11-30 MED ORDER — DEXTROSE-NACL 5-0.9 % IV SOLN: INTRAVENOUS | Status: DC

## 2012-11-30 MED ORDER — SODIUM CHLORIDE 0.9 % IV SOLN
8.0000 mg/h | INTRAVENOUS | Status: DC
  Filled 2012-11-30 (×2): qty 80

## 2012-11-30 MED ORDER — ETOMIDATE 2 MG/ML IV SOLN
INTRAVENOUS | Status: AC
  Administered 2012-11-30: 40 mg
  Filled 2012-11-30: qty 20

## 2012-11-30 MED ORDER — SODIUM CHLORIDE 0.9 % IV SOLN
80.0000 mg | Freq: Once | INTRAVENOUS | Status: AC
  Administered 2012-11-30: 80 mg via INTRAVENOUS
  Filled 2012-11-30: qty 80

## 2012-11-30 MED ORDER — ACETAMINOPHEN 650 MG RE SUPP: 650.0000 mg | Freq: Once | RECTAL | Status: DC

## 2012-11-30 MED ORDER — SODIUM CHLORIDE 0.9 % IV BOLUS (SEPSIS)
1000.0000 mL | Freq: Once | INTRAVENOUS | Status: AC
  Administered 2012-11-30: 1000 mL via INTRAVENOUS

## 2012-11-30 MED ORDER — SODIUM CHLORIDE 0.9 % IV SOLN
250.0000 mL | INTRAVENOUS | Status: DC | PRN
  Administered 2012-12-02: 250 mL via INTRAVENOUS
  Administered 2012-12-06: 01:00:00 via INTRAVENOUS

## 2012-11-30 MED ORDER — VANCOMYCIN HCL IN DEXTROSE 1-5 GM/200ML-% IV SOLN
1000.0000 mg | Freq: Once | INTRAVENOUS | Status: AC
  Administered 2012-11-30: 1000 mg via INTRAVENOUS
  Filled 2012-11-30: qty 200

## 2012-11-30 MED ORDER — FENTANYL CITRATE 0.05 MG/ML IJ SOLN
50.0000 ug | INTRAMUSCULAR | Status: DC | PRN
  Filled 2012-11-30: qty 2

## 2012-11-30 NOTE — ED Notes (Signed)
Pt irrigated with 500 cc's NS, clots returned then clear, flushed with additional 500 with scant amt of blood then clear, Dr Tanna Savoy aware of results.

## 2012-11-30 NOTE — Progress Notes (Signed)
Psychoeducational Group Note  Date:  11/30/2012 Time:  0945 am  Group Topic/Focus:  Identifying Needs:   The focus of this group is to help patients identify their personal needs that have been historically problematic and identify healthy behaviors to address their needs.  Participation Level:  Did Not Attend  Andrena Mews 11/30/2012,3:45 PM

## 2012-11-30 NOTE — Progress Notes (Signed)
Pt lying in bed and was repositioned by the RN. Pt started bleeding from the mouth. EMS was called. Ordered received from the MD to transfer Pt to the ED. Pt's family member was called to two numbers Antionette Char at [phone] and another to Mliss Fritz At [phone]. Neither phone is in working order.

## 2012-11-30 NOTE — ED Notes (Signed)
EMS reports staff from [facility] states, she was seen here yesterday due to falling and fracturing her left clavicle, pt at [facility] for vol detox off ETOH. Pt had dose of pain med at 0400 today, has become more lethargic throughout the day. Pt has blood coming out of mouth, cleaned mouth no injury noted, smell like a GI bleed, with noted bloody emesis on gown.

## 2012-11-30 NOTE — Progress Notes (Signed)
[facility] MD Progress Note  11/30/2012 1:17 PM [name] [name]  MRN:  [id number] Subjective:  Patient has been sleeping all morning, did not awaken to eat, medications are being held--pain medications seem to have had a big effect on her--discharge postponed.  One to one sitter remains due to fall risk and safety concerns, she does respond when her name is called and denies pain--continues resting quitely.  Diagnosis:   Axis I: Alcohol Abuse and Substance Induced Mood Disorder Axis II: Deferred Axis III:  Past Medical History  Diagnosis Date  . Anemia, B12 deficiency   . History of acute pancreatitis   . Right knee pain     No recent imaging on chart  . 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 Dr. Su Hilt, [facility]  . Hypertriglyceridemia   . GERD (gastroesophageal reflux disease)   . Vitamin D deficiency   . Subdural hematoma 02/2008    Likely 2/2 trauma from seizure from EtOH withdrawal, chronic in nature, sees Dr. Robyne Askew. Most recent CT head 10/2009 showing stable but persistent hematoma without mass effect.  . History of seizure disorder     Likely 2/2 alcohol abuse  . Hypocalcemia   . Hypomagnesemia   . Failure to thrive in childhood     Unclear etiology  . HTN (hypertension)   . Thrombocytopenia   . Anemia, macrocytic   . Hepatomegaly     On exam  . Alcohol abuse   . Joint pain   . Alcohol abuse   . Arthritis   . Vitamin D deficiency   . Menopause   . Pancreatitis   . Insomnia   . Hyperlipidemia   . Sinusitis   . Pernicious anemia   . Subdural hematoma   . Macrocytic anemia   . Hepatomegaly   . 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 Seizures     1.5 YRS  LAST ONE  . Depression   . Fx humeral neck 04/17/2011    Transverse fracture- minimally displaced- managed as outpatient   . ABNORMAL PAP SMEAR, LGSIL 07/23/2008    Annotation: HPV positive CIN I Dr. Su Hilt, [facility]  Qualifier: Diagnosis of  By: Danae Chen    . Pneumonia 05/20/2012  . Hepatitis     HEPATOMEGALY    Axis IV: other psychosocial or environmental problems, problems related to social environment and problems with primary support group Axis V: 41-50 serious symptoms  ADL's:  Intact  Sleep: Good  Appetite:  Poor  Suicidal Ideation:  Denies Homicidal Ideation:  Denies  Psychiatric Specialty Exam: Review of Systems  Constitutional: Negative.   HENT: Negative.   Eyes: Negative.   Respiratory: Negative.   Cardiovascular: Negative.   Gastrointestinal: Negative.   Genitourinary: Negative.   Musculoskeletal: Negative.   Skin: Negative.   Neurological: Negative.   Endo/Heme/Allergies: Negative.   Psychiatric/Behavioral: Positive for substance abuse.    Blood pressure 112/75, pulse 121, temperature 98 F (36.7 C), temperature source Oral, resp. rate 18, height 5\' 3"  (1.6 m), weight 38.102 kg (84 lb), SpO2 96.00%.Body mass index is 14.88 kg/(m^2).  General Appearance: Casual  Eye Contact::  Fair  Speech:  Slow  Volume:  Decreased  Mood:  Euthymic  Affect:  Congruent  Thought Process:  Coherent  Orientation:  Full (Time, Place, and Person)  Thought Content:  WDL  Suicidal Thoughts:  No  Homicidal Thoughts:  No  Memory:  Immediate;   Fair Recent;   Fair Remote;   Fair  Judgement:  Fair  Insight:  Fair  Psychomotor Activity:  Decreased  Concentration:  Fair  Recall:  Fair  Akathisia:  No  Handed:  Right  AIMS (if indicated):     Assets:  Resilience  Sleep:  Number of Hours: 6   Current Medications: Current Facility-Administered Medications  Medication Dose Route Frequency Provider Last Rate Last Dose  . acetaminophen (TYLENOL) tablet 650 mg  650 mg Oral Q6H PRN Sanjuana Kava, NP      . alum & mag hydroxide-simeth (MAALOX/MYLANTA) 200-200-20 MG/5ML suspension 30 mL  30 mL Oral Q4H PRN Sanjuana Kava, NP      . amLODipine (NORVASC) tablet 5 mg  5 mg Oral Daily  Sanjuana Kava, NP   5 mg at 11/29/12 0815  . aspirin EC tablet 81 mg  81 mg Oral QPM Sanjuana Kava, NP   81 mg at 11/29/12 1750  . calcium carbonate (OS-CAL - dosed in mg of elemental calcium) tablet 500 mg of elemental calcium  1 tablet Oral TID WC Rachael Fee, MD   500 mg of elemental calcium at 11/29/12 1700  . chlordiazePOXIDE (LIBRIUM) capsule 25 mg  25 mg Oral Daily Sanjuana Kava, NP      . diclofenac sodium (VOLTAREN) 1 % transdermal gel 1 application  1 application Topical TID PRN Sanjuana Kava, NP      . feeding supplement (ENSURE COMPLETE) liquid 237 mL  237 mL Oral BID BM Jeoffrey Massed, RD   237 mL at 11/28/12 1715  . ferrous sulfate tablet 325 mg  325 mg Oral BID WC Sanjuana Kava, NP   325 mg at 11/29/12 1751  . FLUoxetine (PROZAC) capsule 10 mg  10 mg Oral Daily Sanjuana Kava, NP   10 mg at 11/29/12 0816  . folic acid (FOLVITE) tablet 1 mg  1 mg Oral Daily Sanjuana Kava, NP   1 mg at 11/29/12 0817  . furosemide (LASIX) tablet 20 mg  20 mg Oral Daily Sanjuana Kava, NP   20 mg at 11/29/12 0816  . gabapentin (NEURONTIN) capsule 600 mg  600 mg Oral TID Sanjuana Kava, NP   600 mg at 11/29/12 1750  . hydrochlorothiazide (HYDRODIURIL) tablet 25 mg  25 mg Oral Daily Sanjuana Kava, NP   25 mg at 11/29/12 0816  . lipase/protease/amylase (CREON-12/PANCREASE) capsule 1 capsule  1 capsule Oral TID AC Sanjuana Kava, NP   1 capsule at 11/29/12 1751  . magnesium hydroxide (MILK OF MAGNESIA) suspension 30 mL  30 mL Oral Daily PRN Sanjuana Kava, NP      . magnesium oxide (MAG-OX) tablet 400 mg  400 mg Oral TID Sanjuana Kava, NP   400 mg at 11/29/12 1750  . methocarbamol (ROBAXIN) tablet 250 mg  250 mg Oral Q6H PRN Sanjuana Kava, NP      . multivitamin with minerals tablet 1 tablet  1 tablet Oral Daily Sanjuana Kava, NP   1 tablet at 11/29/12 0817  . pantoprazole (PROTONIX) EC tablet 80 mg  80 mg Oral Daily Sanjuana Kava, NP   80 mg at 11/29/12 0815  . potassium chloride SA (K-DUR,KLOR-CON) CR tablet  20 mEq  20 mEq Oral Daily Sanjuana Kava, NP   20 mEq at 11/29/12 0815  . thiamine (B-1) injection 100 mg  100 mg Intramuscular Once Nicole Kindred  I Nwoko, NP      . thiamine (VITAMIN B-1) tablet 100 mg  100 mg Oral Daily Sanjuana Kava, NP   100 mg at 11/29/12 0817  . traMADol (ULTRAM) tablet 50 mg  50 mg Oral Q6H PRN Sanjuana Kava, NP   50 mg at 11/30/12 0440  . traZODone (DESYREL) tablet 50 mg  50 mg Oral QHS PRN Sanjuana Kava, NP   50 mg at 11/28/12 2120  . vitamin B-12 (CYANOCOBALAMIN) tablet 250 mcg  250 mcg Oral QPM Sanjuana Kava, NP   250 mcg at 11/29/12 1750  . Vitamin D (Ergocalciferol) (DRISDOL) capsule 50,000 Units  50,000 Units Oral Q7 days Sanjuana Kava, NP   50,000 Units at 11/28/12 1109    Lab Results:  Results for orders placed during the hospital encounter of 11/26/12 (from the past 48 hour(s))  GLUCOSE, CAPILLARY     Status: None   Collection Time    11/29/12  6:24 AM      Result Value Range   Glucose-Capillary 92  70 - 99 mg/dL   Comment 1 Notify RN     Comment 2 Documented in Chart      Physical Findings: AIMS: Facial and Oral Movements Muscles of Facial Expression: None, normal Lips and Perioral Area: None, normal Jaw: None, normal Tongue: None, normal,Extremity Movements Upper (arms, wrists, hands, fingers): None, normal Lower (legs, knees, ankles, toes): None, normal, Trunk Movements Neck, shoulders, hips: None, normal, Overall Severity Severity of abnormal movements (highest score from questions above): None, normal Incapacitation due to abnormal movements: None, normal Patient's awareness of abnormal movements (rate only patient's report): No Awareness, Dental Status Current problems with teeth and/or dentures?: No Does patient usually wear dentures?: No  CIWA:  CIWA-Ar Total: 0 COWS:  COWS Total Score: 2  Treatment Plan Summary: Daily contact with patient to assess and evaluate symptoms and progress in treatment Medication management  Plan:  Review of chart,  vital signs, medications, and notes. 1-Individual and group therapy 2-Medication management for depression and anxiety:  Medications reviewed with the patient and due to somnolence, medications on hold until increase in alertness (not so sleepy) 3-Coping skills for depression, anxiety, and alcohol abuse 4-Continue crisis stabilization and management 5-Address health issues--monitoring vital signs,tachycardia remains--monitoring continues 6-Treatment plan in progress to prevent relapse of depression, alcohol abuse, and anxiety  Medical Decision Making Problem Points:  Established problem, stable/improving (1) and Review of psycho-social stressors (1) Data Points:  Review of medication regiment & side effects (2)  I certify that inpatient services furnished can reasonably be expected to improve the patient's condition.   Nanine Means, PMH-NP 11/30/2012, 1:17 PM  Reviewed the information documented and agree with the treatment plan.  Eshan Trupiano,JANARDHAHA R. 01/07/2013 12:30 PM

## 2012-11-30 NOTE — Progress Notes (Signed)
Unable to drawn blood for lab due to blood transfusion. Policy is 2 hour post transfusion

## 2012-11-30 NOTE — Progress Notes (Addendum)
Pt remains asleep but continues to be a very high fall risk with a fractured shoulder   Pt on 1:1  Safe at present

## 2012-11-30 NOTE — H&P (Addendum)
Name: [name] [name] MRN: [id number] DOB: 03/27/1956    LOS: 4  Referring Provider:   Reason for Referral:  Aspiration pneumonia  PULMONARY / CRITICAL CARE MEDICINE  HPI:  This is an unfortunate 57 AAF who is known to have some psychiatric problem and history of EtOH abuse who was in an inpatient rehab. Apparently yesterday she had a fall with resulting fracture of the left humeral neck. Today she woke up around 4AM, and was noted to be drowsy and not very responsive, which is unusual for her. She did get tramadol around 4:40AM. Apparently there was a report also of bloody vomitus and in the ED the lavage from NGT showed some bloody materials. No report of melena yet. She was also noted to be hypoxic and CXR showed multifocal opacities suspicious of aspiration pneumonia. She had a fever 102 as per ED physician. Her Hb is also noted to have fallen from a few days ago, has AKI, also has elevated K although there was hemolysis on the sample. Has hyponatremia. Lactate is normal so far.  No known history of cirrhosis, apparently as per GI she probably had EGD last year that did not show varices. GI has been consulted and they started her on octreotide and PPI drip. Got zosyn + vanc in ED. Currently she is drowsy, minimally verbal and does not give a great history. She denies CP, dyspnea, abd pain. CT head was negative.   Past Medical History  Diagnosis Date  . Anemia, B12 deficiency   . History of acute pancreatitis   . Right knee pain     No recent imaging on chart  . 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 Dr. Su Hilt, [facility]  . Hypertriglyceridemia   . GERD (gastroesophageal reflux disease)   . Vitamin D deficiency   . Subdural hematoma 02/2008    Likely 2/2 trauma from seizure from EtOH withdrawal, chronic in nature, sees Dr. Robyne Askew. Most recent CT head 10/2009 showing stable but persistent hematoma without mass  effect.  . History of seizure disorder     Likely 2/2 alcohol abuse  . Hypocalcemia   . Hypomagnesemia   . Failure to thrive in childhood     Unclear etiology  . HTN (hypertension)   . Thrombocytopenia   . Anemia, macrocytic   . Hepatomegaly     On exam  . Alcohol abuse   . Joint pain   . Alcohol abuse   . Arthritis   . Vitamin D deficiency   . Menopause   . Pancreatitis   . Insomnia   . Hyperlipidemia   . Sinusitis   . Pernicious anemia   . Subdural hematoma   . Macrocytic anemia   . Hepatomegaly   . 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 Seizures     1.5 YRS  LAST ONE  . Depression   . Fx humeral neck 04/17/2011    Transverse fracture- minimally displaced- managed as outpatient   . ABNORMAL PAP SMEAR, LGSIL 07/23/2008    Annotation: HPV positive CIN I Dr. Su Hilt, [facility] Qualifier: Diagnosis of  By: Danae Chen    . Pneumonia 05/20/2012  . Hepatitis     HEPATOMEGALY    Past Surgical History  Procedure Laterality Date  . Cesarean section  1983  . Esophagogastroduodenoscopy  07/11/2011    Procedure: ESOPHAGOGASTRODUODENOSCOPY (EGD);  Surgeon: Theda Belfast, MD;  Location: [facility] ENDOSCOPY;  Service: Endoscopy;  Laterality: N/A;  . Colonoscopy  07/11/2011    Procedure: COLONOSCOPY;  Surgeon: Theda Belfast, MD;  Location: [facility] ENDOSCOPY;  Service: Endoscopy;  Laterality: N/A;  . Eye surgery      LEFT EYE YRS AGO   . Rt colectomy  08/28/2011   Prior to Admission medications   Medication Sig Start Date End Date Taking? Authorizing Provider  acetaminophen (TYLENOL) 500 MG tablet Take 500 mg by mouth at bedtime as needed for pain (sleep).   Yes Historical Provider, MD  amLODipine (NORVASC) 10 MG tablet Take 0.5 tablets (5 mg total) by mouth daily. For hypertension 11/12/12  Yes Aletta Edouard, MD  amylase-lipase-protease (PANGESTYME EC) 20-4.5-25 MU per capsule Take 1 capsule by mouth 3 (three) times daily with meals. For low pancreatic function  11/11/12  Yes Sanjuana Kava, NP  aspirin EC 81 MG tablet Take 81 mg by mouth every evening.   Yes Historical Provider, MD  calcium carbonate (TUMS) 500 MG chewable tablet Chew 6 tablets (1,200 mg of elemental calcium total) by mouth 3 (three) times daily. For bone health 11/11/12  Yes Sanjuana Kava, NP  ferrous sulfate 325 (65 FE) MG tablet Take 325 mg by mouth 2 (two) times daily with a meal. 11/11/12  Yes Sanjuana Kava, NP  FLUoxetine (PROZAC) 10 MG capsule Take 1 capsule (10 mg total) by mouth daily. For depression 11/11/12  Yes Sanjuana Kava, NP  folic acid (FOLVITE) 1 MG tablet Take 1 tablet (1 mg total) by mouth daily. For folic acid replacement 11/11/12  Yes Sanjuana Kava, NP  furosemide (LASIX) 20 MG tablet Take 1 tablet (20 mg total) by mouth daily. 11/12/12 11/12/13 Yes Aletta Edouard, MD  gabapentin (NEURONTIN) 300 MG capsule Take 2 capsules (600 mg total) by mouth 3 (three) times daily. For anxiety/pain control 11/11/12  Yes Sanjuana Kava, NP  hydrochlorothiazide (HYDRODIURIL) 25 MG tablet Take 1 tablet (25 mg total) by mouth daily. For high blood pressure control 11/11/12  Yes Sanjuana Kava, NP  magnesium oxide (MAG-OX) 400 MG tablet Take 400 mg by mouth 3 (three) times daily.   Yes Historical Provider, MD  Multiple Vitamin (MULTIVITAMIN WITH MINERALS) TABS Take 1 tablet by mouth daily. For vitamin replacement 11/11/12  Yes Sanjuana Kava, NP  Nutritional Supplements (ENSURE HIGH PROTEIN PO) Take 1 Can by mouth 3 (three) times daily. chocolate   Yes Historical Provider, MD  omeprazole (PRILOSEC) 40 MG capsule Take 1 capsule (40 mg total) by mouth daily. 10/08/12  Yes Aletta Edouard, MD  potassium chloride SA (KLOR-CON M20) 20 MEQ tablet Take 1 tablet (20 mEq total) by mouth daily. For low potassium 11/12/12  Yes Aletta Edouard, MD  thiamine (VITAMIN B-1) 100 MG tablet Take 1 tablet (100 mg total) by mouth daily. For low thiamine 11/11/12  Yes Sanjuana Kava, NP  vitamin B-12 (CYANOCOBALAMIN) 250 MCG tablet  Take 250 mcg by mouth every evening.   Yes Historical Provider, MD  Vitamin D, Ergocalciferol, (DRISDOL) 50000 UNITS CAPS Take 1 capsule (50,000 Units total) by mouth every 7 (seven) days. Thursdays: For bone health 11/11/12  Yes Sanjuana Kava, NP  VOLTAREN 1 % GEL Apply 1 application topically 3 (three) times daily as needed (pain).  11/04/12  Yes Historical Provider, MD  oxyCODONE-acetaminophen (PERCOCET) 5-325 MG per tablet Take 1 tablet by mouth every 6 (six) hours as needed for pain. 11/29/12   Richardean Canal, MD  Allergies Allergies  Allergen Reactions  . Amitriptyline Hcl Swelling    In the face.  . Doxycycline Hyclate Itching    Feels like something crawling under her skin    Family History Family History  Problem Relation Age of Onset  . Cancer Mother     Died from stomach cancer and "flesh eating rash  . Heart failure Father     Died in 47s from an MI  . Alcohol abuse Sister     Twin sister drinks a lot, as did both her parents and brothers  . Stroke Brother     Has 7 brothers, 1 with CVA   Social History  reports that she quit smoking about 2 years ago. Her smoking use included Cigarettes. She smoked 0.00 packs per day. She has never used smokeless tobacco. She reports that she drinks about 6.0 ounces of alcohol per week. She reports that she does not use illicit drugs.  Review Of Systems:  Very difficult to obtain at the moment due to drowsiness but denies CP, dyspnea and abd pain.  Brief patient description:  57 y/o female in rehab for EtOH problem + psych history, found to be drowsy, had bloody vomitus, likely aspirated, had fever + tachy + high WBC + hypoxia, AKI, likely severe sepsis due to asp pneumonia. Also had a fall yesterday with left humeral fracture.  Events Since Admission: Intubated  Current Status: Critical but stable  Vital Signs: Temp:  [98 F (36.7 C)-102.6 F (39.2 C)] 102.6 F (39.2 C) (06/28 1912) Pulse Rate:  [119-124] 124 (06/28 2119) Resp:   [15-19] 16 (06/28 2015) BP: (100-131)/(67-84) 100/67 mmHg (06/28 2119) SpO2:  [82 %-100 %] 100 % (06/28 2119)  Physical Examination: General:  Generally very weak, minimally verbal, follows minimal commands, very drowsy but still arousable Neuro:  No focal deficit, EOM full and equal, pupils equal but sluggish HEENT:  Atraumatic, trachea midline, has very coarse breath sounds on the trachea (bloody secretions noted in the pharynx during intubation) Neck:  No enlarged LN, trachea midline, no trauma Cardiovascular:  RRR, tachycardic Lungs:  Decreased breath sounds bilaterally, has rhonchi bilaterally, no wheeze Abdomen:  Soft, no guarding, hyperactive bowel sounds Musculoskeletal:  Thin and weak appearing, no extremity edema Skin:  No new rash  Principal Problem:   PANCREATITIS Active Problems:   Alcohol dependence   ASSESSMENT AND PLAN  PULMONARY  Recent Labs Lab 11/30/12 1900  PHART 7.303*  PCO2ART 41.3  PO2ART 70.2*  HCO3 19.8*  O2SAT 92.0   Ventilator Settings: PRVC 20, 400, 40%, +5 CXR:  Multifocal opacities suspicious for pneumonia ETT:  Repeat CXR pending  A:  Hypoxia due to aspiration pneumonia, metabolic acidosis with no compensation P:   Intubate, resp support, repeat ABG  CARDIOVASCULAR  Recent Labs Lab 11/30/12 1806  LATICACIDVEN 1.09   ECG:  No tall peaked t-wave, sinus tachy Lines: peripheral  A: sinus tachy due to sepsis P:  Supportive treatment, IVF  RENAL  Recent Labs Lab 11/24/12 1835  11/30/12 1744 11/30/12 2000  NA 133*  --  130*  --   K 6.2*  < > 6.7* 4.0  CL 105  --  100  --   CO2 12*  --  19  --   BUN 16  --  40*  --   CREATININE 1.34*  --  2.46*  --   CALCIUM 7.5*  --  9.0  --   < > = values in this interval not displayed. Intake/Output  None    Foley:  yes  A:  AKI likely due to sepsis, dehydration P:   IVF resuscitation, monitor Cr, monitor K If K remains elevated, will consider giving D50 + insulin + albuterol +  calcium + kayexalate  GASTROINTESTINAL  Recent Labs Lab 11/24/12 1835 11/30/12 1744  AST 148* 31  ALT 25 8  ALKPHOS 462* 242*  BILITOT 0.3 0.2*  PROT 10.4* 8.7*  ALBUMIN 2.6* 2.0*    A:  Likely UGI bleed, unknown if has varices or simply gastritis P:   GI consult, PPI drip, octreotide, may need EGD  HEMATOLOGIC  Recent Labs Lab 11/24/12 1835 11/30/12 1744  HGB 10.2* 7.4*  HCT 31.0* 23.6*  PLT 311 184  INR  --  1.25  APTT  --  40*   A:  Anemia + leukocytosis, likely from UGI bleed + sepsis/PNA P:  Monitor Hb, if lower than 7 then will transfuse Abx  INFECTIOUS  Recent Labs Lab 11/24/12 1835 11/30/12 1744  WBC 8.7 20.2*   Cultures: pending Antibiotics: Zosyn + vancomycin  A:  Likely aspiration pneumonia P:   Zosyn + vanco until culture results come in  ENDOCRINE  Recent Labs Lab 11/29/12 0624 11/30/12 1752  GLUCAP 92 83   A:  Mildly hypoglycemic, probably from sepsis and poor PO intake P:   D5NS and monitor blood sugar  NEUROLOGIC  A:  Altered mental status, probably multifactorial: sepsis/PNA, psych history, maybe some component from the tramadol P:   Adequate sedation without oversedation  BEST PRACTICE / DISPOSITION Level of Care:  Critical Care MICU Primary Service:  PCCM Consultants:  GI Code Status:  Full Diet:  NPO for now DVT Px:  SCD GI Px:  Protonix drip Skin Integrity:  Routine care Social / Family:  Sister and daughter  My critical care time: 35 minutes  Wadie Lessen, M.D. Pulmonary and Critical Care Medicine [facility] Pager: [phone]  11/30/2012, 9:25 PM

## 2012-11-30 NOTE — Procedures (Signed)
Intubation Procedure Note [name] [name] [id number] 01-Apr-1956  Procedure: Intubation Indications: Airway protection and maintenance, respiratory insufficiency  Procedure Details Consent: Risks of procedure as well as the alternatives and risks of each were explained to the (patient/caregiver).  Consent for procedure obtained. Time Out: Verified patient identification, verified procedure, site/side was marked, verified correct patient position, special equipment/implants available, medications/allergies/relevent history reviewed, required imaging and test results available.  Performed  MAC 3   Evaluation Hemodynamic Status: BP stable throughout; O2 sats: stable throughout Patient's Current Condition: stable Complications: No apparent complications Patient did tolerate procedure well. Chest X-ray ordered to verify placement.  CXR: pending.   [name] [name] 11/30/2012

## 2012-11-30 NOTE — Progress Notes (Signed)
Psychoeducational Group Note  Date:  11/30/2012 Time:  0945 am  Group Topic/Focus:  Identifying Needs:   The focus of this group is to help patients identify their personal needs that have been historically problematic and identify healthy behaviors to address their needs.  Participation Level:  Did Not Attend  Andrena Mews 11/30/2012,10:13 AM

## 2012-11-30 NOTE — Progress Notes (Signed)
D   Pt is in bed asleep resting quietly  She has not even been awake to have breakfast or medications    Pt is a high fall risk and has broken her shoulder in a fall at this facility A   Pt on 1:1  R   Safe at present

## 2012-11-30 NOTE — Progress Notes (Signed)
At around 1755 pt was found in bed asleep restless blood was oozing from her mouth    02 sats 85 to 91 on room air   Pt was encouraged to cough and she spit up moderate amounts of dark red blood that was thick and stringy   She was lethargic and having difficulty following directions  and opening her eyes    After strong encouragement pt was able to cough and the sputum was dark red and very thick  Pt was alert and oriented times 3   She was pale and weak    md called and ems called  Pt was transported to [facility] long emergency room   Vitals 98.7  120/82   120  resp 12

## 2012-11-30 NOTE — Progress Notes (Signed)
ANTIBIOTIC CONSULT NOTE - INITIAL  Pharmacy Consult for Vancomycin/Zosyn Indication: Aspiration PNA  Allergies  Allergen Reactions  . Amitriptyline Hcl Swelling    In the face.  . Doxycycline Hyclate Itching    Feels like something crawling under her skin    Patient Measurements: Height: 5\' 3"  (160 cm) Weight: 84 lb (38.102 kg) IBW/kg (Calculated) : 52.4   Vital Signs: Temp: 102.6 F (39.2 C) (06/28 1912) Temp src: Rectal (06/28 1912) BP: 103/77 mmHg (06/28 2015) Pulse Rate: 124 (06/28 1945) Intake/Output from previous day:   Intake/Output from this shift:    Labs:  Recent Labs  11/30/12 1744  WBC 20.2*  HGB 7.4*  PLT 184  CREATININE 2.46*   Estimated Creatinine Clearance: 15.5 ml/min (by C-G formula based on Cr of 2.46). No results found for this basename: VANCOTROUGH, VANCOPEAK, VANCORANDOM, GENTTROUGH, GENTPEAK, GENTRANDOM, TOBRATROUGH, TOBRAPEAK, TOBRARND, AMIKACINPEAK, AMIKACINTROU, AMIKACIN,  in the last 72 hours   Microbiology: No results found for this or any previous visit (from the past 720 hour(s)).  Medical History: Past Medical History  Diagnosis Date  . Anemia, B12 deficiency   . History of acute pancreatitis   . Right knee pain     No recent imaging on chart  . 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 Dr. Su Hilt, [facility]  . Hypertriglyceridemia   . GERD (gastroesophageal reflux disease)   . Vitamin D deficiency   . Subdural hematoma 02/2008    Likely 2/2 trauma from seizure from EtOH withdrawal, chronic in nature, sees Dr. Robyne Askew. Most recent CT head 10/2009 showing stable but persistent hematoma without mass effect.  . History of seizure disorder     Likely 2/2 alcohol abuse  . Hypocalcemia   . Hypomagnesemia   . Failure to thrive in childhood     Unclear etiology  . HTN (hypertension)   . Thrombocytopenia   . Anemia, macrocytic   . Hepatomegaly     On exam  .  Alcohol abuse   . Joint pain   . Alcohol abuse   . Arthritis   . Vitamin D deficiency   . Menopause   . Pancreatitis   . Insomnia   . Hyperlipidemia   . Sinusitis   . Pernicious anemia   . Subdural hematoma   . Macrocytic anemia   . Hepatomegaly   . 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 Seizures     1.5 YRS  LAST ONE  . Depression   . Fx humeral neck 04/17/2011    Transverse fracture- minimally displaced- managed as outpatient   . ABNORMAL PAP SMEAR, LGSIL 07/23/2008    Annotation: HPV positive CIN I Dr. Su Hilt, [facility] Qualifier: Diagnosis of  By: Danae Chen    . Pneumonia 05/20/2012  . Hepatitis     HEPATOMEGALY     Medications:  Anti-infectives   Start     Dose/Rate Route Frequency Ordered Stop   11/30/12 1900  vancomycin (VANCOCIN) IVPB 1000 mg/200 mL premix     1,000 mg 200 mL/hr over 60 Minutes Intravenous  Once 11/30/12 1855     11/30/12 1900  piperacillin-tazobactam (ZOSYN) IVPB 3.375 g     3.375 g 12.5 mL/hr over 240 Minutes Intravenous  Once 11/30/12 1855       Assessment: 56 YOF admitted from [facility] w/ AMS and GIB. Pharmacy asked to dose Vanc/Zosyn for possible aspiration PNA. One time  Vancomycin and Zosyn doses administered in ER.  Scr elevated -acute renal failure. CrCl 29 ml/min/1.59m2  WBC elevated, Tm 102.6  No cultures ordered  Goal of Therapy:  Vancomycin trough level 15-20 mcg/ml Appropriate dose of Zosyn based on renal fx  Plan:  Vancomycin 500mg  IV q24h Zosyn 2.25g IV q8h Follow labs, vitals and cultures VT @ Css or sooner if necessary  Gwen Her PharmD  [phone] 11/30/2012 9:04 PM

## 2012-11-30 NOTE — H&P (Signed)
Name: [name] [name] MRN: [id number] DOB: 08-Nov-1955    LOS: 0  Referring Provider:   Reason for Referral:  Aspiration pneumonia  PULMONARY / CRITICAL CARE MEDICINE  HPI:  This is an unfortunate 57 AAF who is known to have some psychiatric problem and history of EtOH abuse who was in an inpatient rehab. Apparently yesterday she had a fall with resulting fracture of the left humeral neck. Today she woke up around 4AM, and was noted to be drowsy and not very responsive, which is unusual for her. She did get tramadol around 4:40AM. Apparently there was a report also of bloody vomitus and in the ED the lavage from NGT showed some bloody materials. No report of melena yet. She was also noted to be hypoxic and CXR showed multifocal opacities suspicious of aspiration pneumonia. She had a fever 102 as per ED physician. Her Hb is also noted to have fallen from a few days ago, has AKI, also has elevated K although there was hemolysis on the sample. Has hyponatremia. Lactate is normal so far.  No known history of cirrhosis, apparently as per GI she probably had EGD last year that did not show varices. GI has been consulted and they started her on octreotide and PPI drip. Got zosyn + vanc in ED. Currently she is drowsy, minimally verbal and does not give a great history. She denies CP, dyspnea, abd pain. CT head was negative.   Past Medical History  Diagnosis Date  . Anemia, B12 deficiency   . History of acute pancreatitis   . Right knee pain     No recent imaging on chart  . 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 Dr. Su Hilt, [facility]  . Hypertriglyceridemia   . GERD (gastroesophageal reflux disease)   . Vitamin D deficiency   . Subdural hematoma 02/2008    Likely 2/2 trauma from seizure from EtOH withdrawal, chronic in nature, sees Dr. Robyne Askew. Most recent CT head 10/2009 showing stable but persistent hematoma without mass  effect.  . History of seizure disorder     Likely 2/2 alcohol abuse  . Hypocalcemia   . Hypomagnesemia   . Failure to thrive in childhood     Unclear etiology  . HTN (hypertension)   . Thrombocytopenia   . Anemia, macrocytic   . Hepatomegaly     On exam  . Alcohol abuse   . Joint pain   . Alcohol abuse   . Arthritis   . Vitamin D deficiency   . Menopause   . Pancreatitis   . Insomnia   . Hyperlipidemia   . Sinusitis   . Pernicious anemia   . Subdural hematoma   . Macrocytic anemia   . Hepatomegaly   . 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 Seizures     1.5 YRS  LAST ONE  . Depression   . Fx humeral neck 04/17/2011    Transverse fracture- minimally displaced- managed as outpatient   . ABNORMAL PAP SMEAR, LGSIL 07/23/2008    Annotation: HPV positive CIN I Dr. Su Hilt, [facility] Qualifier: Diagnosis of  By: Danae Chen    . Pneumonia 05/20/2012  . Hepatitis     HEPATOMEGALY    Past Surgical History  Procedure Laterality Date  . Cesarean section  1983  . Esophagogastroduodenoscopy  07/11/2011    Procedure: ESOPHAGOGASTRODUODENOSCOPY (EGD);  Surgeon: Theda Belfast, MD;  Location: [facility] ENDOSCOPY;  Service: Endoscopy;  Laterality: N/A;  . Colonoscopy  07/11/2011    Procedure: COLONOSCOPY;  Surgeon: Theda Belfast, MD;  Location: [facility] ENDOSCOPY;  Service: Endoscopy;  Laterality: N/A;  . Eye surgery      LEFT EYE YRS AGO   . Rt colectomy  08/28/2011   Prior to Admission medications   Medication Sig Start Date End Date Taking? Authorizing Provider  acetaminophen (TYLENOL) 500 MG tablet Take 500 mg by mouth at bedtime as needed for pain (sleep).   Yes Historical Provider, MD  amLODipine (NORVASC) 10 MG tablet Take 0.5 tablets (5 mg total) by mouth daily. For hypertension 11/12/12  Yes Aletta Edouard, MD  amylase-lipase-protease (PANGESTYME EC) 20-4.5-25 MU per capsule Take 1 capsule by mouth 3 (three) times daily with meals. For low pancreatic function  11/11/12  Yes Sanjuana Kava, NP  aspirin EC 81 MG tablet Take 81 mg by mouth every evening.   Yes Historical Provider, MD  calcium carbonate (TUMS) 500 MG chewable tablet Chew 6 tablets (1,200 mg of elemental calcium total) by mouth 3 (three) times daily. For bone health 11/11/12  Yes Sanjuana Kava, NP  ferrous sulfate 325 (65 FE) MG tablet Take 325 mg by mouth 2 (two) times daily with a meal. 11/11/12  Yes Sanjuana Kava, NP  FLUoxetine (PROZAC) 10 MG capsule Take 1 capsule (10 mg total) by mouth daily. For depression 11/11/12  Yes Sanjuana Kava, NP  folic acid (FOLVITE) 1 MG tablet Take 1 tablet (1 mg total) by mouth daily. For folic acid replacement 11/11/12  Yes Sanjuana Kava, NP  furosemide (LASIX) 20 MG tablet Take 1 tablet (20 mg total) by mouth daily. 11/12/12 11/12/13 Yes Aletta Edouard, MD  gabapentin (NEURONTIN) 300 MG capsule Take 2 capsules (600 mg total) by mouth 3 (three) times daily. For anxiety/pain control 11/11/12  Yes Sanjuana Kava, NP  hydrochlorothiazide (HYDRODIURIL) 25 MG tablet Take 1 tablet (25 mg total) by mouth daily. For high blood pressure control 11/11/12  Yes Sanjuana Kava, NP  magnesium oxide (MAG-OX) 400 MG tablet Take 400 mg by mouth 3 (three) times daily.   Yes Historical Provider, MD  Multiple Vitamin (MULTIVITAMIN WITH MINERALS) TABS Take 1 tablet by mouth daily. For vitamin replacement 11/11/12  Yes Sanjuana Kava, NP  Nutritional Supplements (ENSURE HIGH PROTEIN PO) Take 1 Can by mouth 3 (three) times daily. chocolate   Yes Historical Provider, MD  omeprazole (PRILOSEC) 40 MG capsule Take 1 capsule (40 mg total) by mouth daily. 10/08/12  Yes Aletta Edouard, MD  potassium chloride SA (KLOR-CON M20) 20 MEQ tablet Take 1 tablet (20 mEq total) by mouth daily. For low potassium 11/12/12  Yes Aletta Edouard, MD  thiamine (VITAMIN B-1) 100 MG tablet Take 1 tablet (100 mg total) by mouth daily. For low thiamine 11/11/12  Yes Sanjuana Kava, NP  vitamin B-12 (CYANOCOBALAMIN) 250 MCG tablet  Take 250 mcg by mouth every evening.   Yes Historical Provider, MD  Vitamin D, Ergocalciferol, (DRISDOL) 50000 UNITS CAPS Take 1 capsule (50,000 Units total) by mouth every 7 (seven) days. Thursdays: For bone health 11/11/12  Yes Sanjuana Kava, NP  VOLTAREN 1 % GEL Apply 1 application topically 3 (three) times daily as needed (pain).  11/04/12  Yes Historical Provider, MD  oxyCODONE-acetaminophen (PERCOCET) 5-325 MG per tablet Take 1 tablet by mouth every 6 (six) hours as needed for pain. 11/29/12   Richardean Canal, MD  Allergies Allergies  Allergen Reactions  . Amitriptyline Hcl Swelling    In the face.  . Doxycycline Hyclate Itching    Feels like something crawling under her skin    Family History Family History  Problem Relation Age of Onset  . Cancer Mother     Died from stomach cancer and "flesh eating rash  . Heart failure Father     Died in 62s from an MI  . Alcohol abuse Sister     Twin sister drinks a lot, as did both her parents and brothers  . Stroke Brother     Has 7 brothers, 1 with CVA   Social History  reports that she quit smoking about 2 years ago. Her smoking use included Cigarettes. She smoked 0.00 packs per day. She has never used smokeless tobacco. She reports that she drinks about 6.0 ounces of alcohol per week. She reports that she does not use illicit drugs.  Review Of Systems:  Very difficult to obtain at the moment due to drowsiness but denies CP, dyspnea and abd pain.  Brief patient description:  57 y/o female in rehab for EtOH problem + psych history, found to be drowsy, had bloody vomitus, likely aspirated, had fever + tachy + high WBC + hypoxia, AKI, likely severe sepsis due to asp pneumonia. Also had a fall yesterday with left humeral fracture.  Events Since Admission: Intubated  Current Status: Critical but stable  Vital Signs: Temp:  [98 F (36.7 C)-102.6 F (39.2 C)] 100.7 F (38.2 C) (06/28 2300) Pulse Rate:  [113-131] 114 (06/28 2336) Resp:   [15-19] 18 (06/28 2336) BP: (100-160)/(67-97) 151/97 mmHg (06/28 2336) SpO2:  [82 %-100 %] 100 % (06/28 2336) FiO2 (%):  [50 %] 50 % (06/28 2336) Weight:  [43 kg (94 lb 12.8 oz)] 43 kg (94 lb 12.8 oz) (06/28 2300)  Physical Examination: General:  Intubated, sedated Neuro:  No focal deficit, EOM full and equal, pupils equal but sluggish HEENT:  Atraumatic, trachea midline, ET tube well positioned Neck:  No enlarged LN, trachea midline, no trauma Cardiovascular:  RRR, tachycardic Lungs:  Decreased breath sounds bilaterally, has rhonchi bilaterally, no wheeze Abdomen:  Soft, no guarding, hyperactive bowel sounds. Coffee ground aspirate seen from NGT Musculoskeletal:  Thin and weak appearing, no extremity edema Skin:  No new rash   ASSESSMENT AND PLAN  PULMONARY  Recent Labs Lab 11/30/12 1900 11/30/12 2318  PHART 7.303* 7.357  PCO2ART 41.3 31.4*  PO2ART 70.2* 91.5  HCO3 19.8* 16.9*  O2SAT 92.0 96.6   Ventilator Settings: PRVC 20, 400, 40%, +5 CXR:  Multifocal opacities suspicious for pneumonia ETT:  Repeat CXR pending  A:  Hypoxia due to aspiration pneumonia, metabolic acidosis with no compensation P:   Intubate, resp support, repeat ABG  CARDIOVASCULAR  Recent Labs Lab 11/30/12 1806  LATICACIDVEN 1.09   ECG:  No tall peaked t-wave, sinus tachy Lines: peripheral  A: sinus tachy due to sepsis P:  Supportive treatment, IVF  RENAL  Recent Labs Lab 11/24/12 1835  11/30/12 1744 11/30/12 2000  NA 133*  --  130*  --   K 6.2*  < > 6.7* 4.0  CL 105  --  100  --   CO2 12*  --  19  --   BUN 16  --  40*  --   CREATININE 1.34*  --  2.46*  --   CALCIUM 7.5*  --  9.0  --   < > = values in this interval  not displayed. Intake/Output   None    Foley:  yes  A:  AKI likely due to sepsis, dehydration P:   IVF resuscitation, monitor Cr, monitor K If K remains elevated, will consider giving D50 + insulin + albuterol + calcium +  kayexalate  GASTROINTESTINAL  Recent Labs Lab 11/24/12 1835 11/30/12 1744  AST 148* 31  ALT 25 8  ALKPHOS 462* 242*  BILITOT 0.3 0.2*  PROT 10.4* 8.7*  ALBUMIN 2.6* 2.0*    A:  Likely UGI bleed, unknown if has varices or simply gastritis P:   GI consult, PPI drip, octreotide, may need EGD  HEMATOLOGIC  Recent Labs Lab 11/24/12 1835 11/30/12 1744  HGB 10.2* 7.4*  HCT 31.0* 23.6*  PLT 311 184  INR  --  1.25  APTT  --  40*   A:  Anemia + leukocytosis, likely from UGI bleed + sepsis/PNA P:  Monitor Hb, if lower than 7 then will transfuse Abx  INFECTIOUS  Recent Labs Lab 11/24/12 1835 11/30/12 1744  WBC 8.7 20.2*   Cultures: pending Antibiotics: Zosyn + vancomycin  A:  Likely aspiration pneumonia P:   Zosyn + vanco until culture results come in  ENDOCRINE  Recent Labs Lab 11/29/12 0624 11/30/12 1752  GLUCAP 92 83   A:  Mildly hypoglycemic, probably from sepsis and poor PO intake P:   D5NS and monitor blood sugar  NEUROLOGIC  A:  Altered mental status, probably multifactorial: sepsis/PNA, psych history, maybe some component from the tramadol P:   Adequate sedation without oversedation  BEST PRACTICE / DISPOSITION Level of Care:  Critical Care MICU Primary Service:  PCCM Consultants:  GI Code Status:  Full Diet:  NPO for now DVT Px:  SCD GI Px:  Protonix drip Skin Integrity:  Routine care Social / Family:  Sister and daughter  My critical care time: 35 minutes  Wadie Lessen, M.D. Pulmonary and Critical Care Medicine [facility] Pager: [phone]  11/30/2012, 11:51 PM

## 2012-11-30 NOTE — ED Notes (Signed)
ZOX:WRUE<AV> Expected date:11/30/12<BR> Expected time: 5:17 PM<BR> Means of arrival:Ambulance<BR> Comments:<BR> BH pt fall, lethargic from ? Pain meds

## 2012-11-30 NOTE — ED Provider Notes (Addendum)
History    CSN: [id number] Arrival date & time 11/29/12  0619  First MD Initiated Contact with Patient 11/29/12 [phone]     Chief Complaint  Patient presents with  . Altered Mental Status   (Consider location/radiation/quality/duration/timing/severity/associated sxs/prior Treatment) HPI Comments: Patient currently in patient at behavioral health for alcohol abuse and psychiatric history and today was noted to not be her normal self starting around 4 AM. She became increasingly altered over the course of the day and they sent her here for further evaluation. Upon arrival here patient is unresponsive with blood around her lips.  She will briefly open her eyes when spoken to but will not answer any questions.  Apparently patient had a fall yesterday and was diagnosed with a humeral head fracture. Unclear she had had at that time.  The history is provided by medical records. The history is limited by the condition of the patient.   Past Medical History  Diagnosis Date  . Anemia, B12 deficiency   . History of acute pancreatitis   . Right knee pain     No recent imaging on chart  . 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 Dr. Su Hilt, [facility]  . Hypertriglyceridemia   . GERD (gastroesophageal reflux disease)   . Vitamin D deficiency   . Subdural hematoma 02/2008    Likely 2/2 trauma from seizure from EtOH withdrawal, chronic in nature, sees Dr. Robyne Askew. Most recent CT head 10/2009 showing stable but persistent hematoma without mass effect.  . History of seizure disorder     Likely 2/2 alcohol abuse  . Hypocalcemia   . Hypomagnesemia   . Failure to thrive in childhood     Unclear etiology  . HTN (hypertension)   . Thrombocytopenia   . Anemia, macrocytic   . Hepatomegaly     On exam  . Alcohol abuse   . Joint pain   . Alcohol abuse   . Arthritis   . Vitamin D deficiency   . Menopause   . Pancreatitis   .  Insomnia   . Hyperlipidemia   . Sinusitis   . Pernicious anemia   . Subdural hematoma   . Macrocytic anemia   . Hepatomegaly   . 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 Seizures     1.5 YRS  LAST ONE  . Depression   . Fx humeral neck 04/17/2011    Transverse fracture- minimally displaced- managed as outpatient   . ABNORMAL PAP SMEAR, LGSIL 07/23/2008    Annotation: HPV positive CIN I Dr. Su Hilt, [facility] Qualifier: Diagnosis of  By: Danae Chen    . Pneumonia 05/20/2012  . Hepatitis     HEPATOMEGALY    Past Surgical History  Procedure Laterality Date  . Cesarean section  1983  . Esophagogastroduodenoscopy  07/11/2011    Procedure: ESOPHAGOGASTRODUODENOSCOPY (EGD);  Surgeon: Theda Belfast, MD;  Location: [facility];  Service: Endoscopy;  Laterality: N/A;  . Colonoscopy  07/11/2011    Procedure: COLONOSCOPY;  Surgeon: Theda Belfast, MD;  Location: [facility];  Service: Endoscopy;  Laterality: N/A;  . Eye surgery      LEFT EYE YRS AGO   . Rt colectomy  08/28/2011   Family History  Problem Relation Age of Onset  . Cancer Mother     Died from stomach cancer and "flesh eating rash  . Heart failure Father  Died in 66s from an MI  . Alcohol abuse Sister     Twin sister drinks a lot, as did both her parents and brothers  . Stroke Brother     Has 7 brothers, 1 with CVA   History  Substance Use Topics  . Smoking status: Former Smoker    Types: Cigarettes    Quit date: 09/20/2010  . Smokeless tobacco: Never Used  . Alcohol Use: 6.0 oz/week    10 Glasses of wine per week     Comment: No alocohol x 3 weeks   OB History   Grav Para Term Preterm Abortions TAB SAB Ect Mult Living                 Review of Systems  Unable to perform ROS   Allergies  Amitriptyline hcl and Doxycycline hyclate  Home Medications   Current Outpatient Rx  Name  Route  Sig  Dispense  Refill  . acetaminophen (TYLENOL) 500 MG tablet   Oral   Take 500 mg by mouth  at bedtime as needed for pain (sleep).         Marland Kitchen amLODipine (NORVASC) 10 MG tablet   Oral   Take 0.5 tablets (5 mg total) by mouth daily. For hypertension   30 tablet   3   . amylase-lipase-protease (PANGESTYME EC) 20-4.5-25 MU per capsule   Oral   Take 1 capsule by mouth 3 (three) times daily with meals. For low pancreatic function   90 capsule   4   . aspirin EC 81 MG tablet   Oral   Take 81 mg by mouth every evening.         . calcium carbonate (TUMS) 500 MG chewable tablet   Oral   Chew 6 tablets (1,200 mg of elemental calcium total) by mouth 3 (three) times daily. For bone health   90 tablet   3   . ferrous sulfate 325 (65 FE) MG tablet   Oral   Take 325 mg by mouth 2 (two) times daily with a meal.         . FLUoxetine (PROZAC) 10 MG capsule   Oral   Take 1 capsule (10 mg total) by mouth daily. For depression   30 capsule   0   . folic acid (FOLVITE) 1 MG tablet   Oral   Take 1 tablet (1 mg total) by mouth daily. For folic acid replacement   30 tablet   1   . furosemide (LASIX) 20 MG tablet   Oral   Take 1 tablet (20 mg total) by mouth daily.   30 tablet   1   . gabapentin (NEURONTIN) 300 MG capsule   Oral   Take 2 capsules (600 mg total) by mouth 3 (three) times daily. For anxiety/pain control   90 capsule   2   . hydrochlorothiazide (HYDRODIURIL) 25 MG tablet   Oral   Take 1 tablet (25 mg total) by mouth daily. For high blood pressure control   30 tablet   3   . magnesium oxide (MAG-OX) 400 MG tablet   Oral   Take 400 mg by mouth 3 (three) times daily.         . Multiple Vitamin (MULTIVITAMIN WITH MINERALS) TABS   Oral   Take 1 tablet by mouth daily. For vitamin replacement   30 tablet   11   . Nutritional Supplements (ENSURE HIGH PROTEIN PO)   Oral   Take 1 Can  by mouth 3 (three) times daily. chocolate         . omeprazole (PRILOSEC) 40 MG capsule   Oral   Take 1 capsule (40 mg total) by mouth daily.   30 capsule   3   .  potassium chloride SA (KLOR-CON M20) 20 MEQ tablet   Oral   Take 1 tablet (20 mEq total) by mouth daily. For low potassium         . thiamine (VITAMIN B-1) 100 MG tablet   Oral   Take 1 tablet (100 mg total) by mouth daily. For low thiamine   90 tablet   3   . vitamin B-12 (CYANOCOBALAMIN) 250 MCG tablet   Oral   Take 250 mcg by mouth every evening.         . Vitamin D, Ergocalciferol, (DRISDOL) 50000 UNITS CAPS   Oral   Take 1 capsule (50,000 Units total) by mouth every 7 (seven) days. Thursdays: For bone health   30 capsule      . VOLTAREN 1 % GEL   Topical   Apply 1 application topically 3 (three) times daily as needed (pain).          Marland Kitchen oxyCODONE-acetaminophen (PERCOCET) 5-325 MG per tablet   Oral   Take 1 tablet by mouth every 6 (six) hours as needed for pain.   15 tablet   0    BP 131/84  Pulse 122  Temp(Src) 99.4 F (37.4 C) (Oral)  Resp 18  Ht 5\' 3"  (1.6 m)  Wt 84 lb (38.102 kg)  BMI 14.88 kg/m2  SpO2 95% Physical Exam  Nursing note and vitals reviewed. Constitutional: She appears well-developed and well-nourished. No distress.  HENT:  Head: Normocephalic and atraumatic.  Mouth/Throat: Oropharynx is clear and moist.  Blood in the mouth without signs of tongue injury  Eyes: EOM are normal. Pupils are equal, round, and reactive to light.  Pale conjunctivae with pinpoint pupils  Neck: Normal range of motion. Neck supple.  Cardiovascular: Regular rhythm and intact distal pulses.  Tachycardia present.   No murmur heard. Pulmonary/Chest: Tachypnea noted. No respiratory distress. She has no wheezes. She has rhonchi. She has no rales.  Diffuse rhonchi  Abdominal: Soft. She exhibits distension and ascites. There is no tenderness. There is no rebound and no guarding.  Musculoskeletal: Normal range of motion. She exhibits tenderness. She exhibits no edema.  Pain with palpation to the left humerus  Neurological:  Somnolent but would open her eyes to voice   Skin: Skin is warm and dry. No rash noted. No erythema. There is pallor.  Psychiatric: She has a normal mood and affect. Her behavior is normal.    ED Course  Procedures (including critical care time) Labs Reviewed  CBC WITH DIFFERENTIAL - Abnormal; Notable for the following:    WBC 20.2 (*)    RBC 2.21 (*)    Hemoglobin 7.4 (*)    HCT 23.6 (*)    MCV 106.8 (*)    Monocytes Relative 21 (*)    Neutro Abs 12.8 (*)    Monocytes Absolute 4.2 (*)    All other components within normal limits  PROTIME-INR - Abnormal; Notable for the following:    Prothrombin Time 15.4 (*)    All other components within normal limits  APTT - Abnormal; Notable for the following:    aPTT 40 (*)    All other components within normal limits  BLOOD GAS, ARTERIAL - Abnormal; Notable for the following:  pH, Arterial 7.303 (*)    pO2, Arterial 70.2 (*)    Bicarbonate 19.8 (*)    Acid-base deficit 5.5 (*)    All other components within normal limits  GLUCOSE, CAPILLARY  GLUCOSE, CAPILLARY  COMPREHENSIVE METABOLIC PANEL  URINALYSIS, ROUTINE W REFLEX MICROSCOPIC  OCCULT BLOOD X 1 CARD TO LAB, STOOL  OCCULT BLOOD, POC DEVICE  CG4 I-STAT (LACTIC ACID)  POCT I-STAT TROPONIN I  TYPE AND SCREEN  PREPARE RBC (CROSSMATCH)   Ct Head Wo Contrast  11/30/2012   *RADIOLOGY REPORT*  Clinical Data: Altered mental status.  Unresponsive.  CT HEAD WITHOUT CONTRAST  Technique:  Contiguous axial images were obtained from the base of the skull through the vertex without contrast.  Comparison: 09/06/2011  Findings: The brain shows generalized atrophy.  There is no evidence of old or acute focal infarction, mass lesion, hemorrhage, hydrocephalus or extra-axial collection.  The calvarium is unremarkable.  Sinuses, middle ears and mastoids are clear.  IMPRESSION: Atrophy.  No focal or acute finding.   Original Report Authenticated By: Paulina Fusi, M.D.   Dg Chest Port 1 View  11/30/2012   *RADIOLOGY REPORT*  Clinical Data:  Hypoxia, altered mental status  PORTABLE CHEST - 1 VIEW  Comparison: 08/07/2012; 05/20/2012  Findings:  Grossly unchanged cardiac silhouette and mediastinal contours given slightly reduced lung volumes.  An enteric tube tip and side port projects below the left hemidiaphragm.  Interval development of heterogeneous air space opacities within the peripheral aspect the right mid lung as well as the left lower / retrocardiac lung.  No definite pleural effusion or pneumothorax.  No definite evidence of edema.  Old/healed fracture involving the surgical neck of the right humerus. No acute osseous abnormalities.  IMPRESSION: Bilateral heterogeneous air space opacities worrisome for multifocal infection.  A follow-up chest radiograph in 4 to 6 weeks after treatment is recommended to ensure resolution.   Original Report Authenticated By: Tacey Ruiz, MD   Dg Shoulder Left  11/29/2012   *RADIOLOGY REPORT*  Clinical Data: Larey Seat this morning with severe shoulder pain and decreased range of motion  LEFT SHOULDER - 2+ VIEW  Comparison: None.  Findings: There is a slightly angulated impacted fracture of the left humeral neck.  The left humeral head remains in normal position.  The left [facility] joint is normally aligned.  The ribs are visualized are intact.  IMPRESSION: Slightly angulated and impacted fracture of the left humeral neck.   Original Report Authenticated By: Dwyane Dee, M.D.   1. Alcohol dependence   2. Depressive disorder, not elsewhere classified   3. Alcohol abuse, unspecified   4. Humeral surgical neck fracture, left, closed, initial encounter   5. Seizure disorder   6. Tachycardia    Date: 11/30/2012  Rate: 119  Rhythm: sinus tachycardia  QRS Axis: normal  Intervals: normal  ST/T Wave abnormalities: normal  Conduction Disutrbances:none  Narrative Interpretation:   Old EKG Reviewed: unchanged   CRITICAL CARE Performed by: Gwyneth Sprout Total critical care time: 45 Critical care time was  exclusive of separately billable procedures and treating other patients. Critical care was necessary to treat or prevent imminent or life-threatening deterioration. Critical care was time spent personally by me on the following activities: development of treatment plan with patient and/or surrogate as well as nursing, discussions with consultants, evaluation of patient's response to treatment, examination of patient, obtaining history from patient or surrogate, ordering and performing treatments and interventions, ordering and review of laboratory studies, ordering and review of radiographic  studies, pulse oximetry and re-evaluation of patient's condition.  MDM   Patient presented from behavioral health with altered mental status and difficulty breathing. On arrival here the patient was somnolent but would open her eyes occasionally to voice. She was satting 82% on room air and was placed on 2 L nasal cannula with improvement in her O2 sats. Patient was noted at that time to have rhonchorous breath sounds as well as some blood in her mouth. An NG tube was placed with bright red blood. After a 500 mL was brought cleared and now is just slightly pink tinged. The patient does have a history of chronic alcohol abuse never been diagnosed with cirrhosis. Patient was started on Protonix and octreotide. CBC shows a hemoglobin of 7.4 which 6 days ago it was 10. Blood transfusion was initiated. Patient's Hemoccult was negative and platelets are normal. NG tube placement oxygen and IV fluids patient became slightly more awake. She does open her eyes to voice but does not speak. Chest x-ray shows a multifocal pneumonia and because patient has been a behavioral health for an extended amount of time with vanc/zosyn.  Spoke with Dr. Marina Goodell with GI and currently no acute therapy but they will consult.  ABG with mild acidosis but sating 95% on [place] and currently still has a gag.  Will hold intubation. Will discuss with critical  care.  Gwyneth Sprout, MD 11/30/12 1930  Gwyneth Sprout, MD 11/30/12 1950

## 2012-11-30 NOTE — [facility] Group Notes (Signed)
[facility] Group Notes: (Clinical Social Work)   11/30/2012      Type of Therapy:  Group Therapy   Participation Level:  Did Not Attend    Ambrose Mantle, LCSW 11/30/2012, 4:33 PM

## 2012-12-01 ENCOUNTER — Encounter (HOSPITAL_COMMUNITY): Payer: Self-pay | Admitting: Internal Medicine

## 2012-12-01 ENCOUNTER — Inpatient Hospital Stay (HOSPITAL_COMMUNITY): Payer: PRIVATE HEALTH INSURANCE

## 2012-12-01 ENCOUNTER — Encounter (HOSPITAL_COMMUNITY): Admission: AD | Disposition: A | Discharge status: Home Health | Payer: Self-pay | Attending: Pulmonary Disease

## 2012-12-01 DIAGNOSIS — F1010 Alcohol abuse, uncomplicated: Secondary | ICD-10-CM

## 2012-12-01 DIAGNOSIS — K920 Hematemesis: Secondary | ICD-10-CM

## 2012-12-01 DIAGNOSIS — F1020 Alcohol dependence, uncomplicated: Secondary | ICD-10-CM

## 2012-12-01 DIAGNOSIS — E8809 Other disorders of plasma-protein metabolism, not elsewhere classified: Secondary | ICD-10-CM

## 2012-12-01 DIAGNOSIS — E875 Hyperkalemia: Secondary | ICD-10-CM

## 2012-12-01 DIAGNOSIS — E8779 Other fluid overload: Secondary | ICD-10-CM

## 2012-12-01 DIAGNOSIS — N179 Acute kidney failure, unspecified: Secondary | ICD-10-CM

## 2012-12-01 HISTORY — PX: ESOPHAGOGASTRODUODENOSCOPY: SHX5428

## 2012-12-01 LAB — GLUCOSE, CAPILLARY: Glucose-Capillary: 122 mg/dL — ABNORMAL HIGH (ref 70–99)

## 2012-12-01 LAB — BLOOD GAS, ARTERIAL
Acid-base deficit: 8.3 mmol/L — ABNORMAL HIGH (ref 0.0–2.0)
Drawn by: 308601
FIO2: 0.5 %
RATE: 16 resp/min
pCO2 arterial: 30.6 mmHg — ABNORMAL LOW (ref 35.0–45.0)
pH, Arterial: 7.341 — ABNORMAL LOW (ref 7.350–7.450)
pO2, Arterial: 86.4 mmHg (ref 80.0–100.0)

## 2012-12-01 LAB — BASIC METABOLIC PANEL
CO2: 15 mEq/L — ABNORMAL LOW (ref 19–32)
CO2: 17 mEq/L — ABNORMAL LOW (ref 19–32)
Calcium: 7.9 mg/dL — ABNORMAL LOW (ref 8.4–10.5)
Calcium: 7.3 mg/dL — ABNORMAL LOW (ref 8.4–10.5)
Creatinine, Ser: 2.08 mg/dL — ABNORMAL HIGH (ref 0.50–1.10)
GFR calc Af Amer: 30 mL/min — ABNORMAL LOW (ref >90)
GFR calc non Af Amer: 28 mL/min — ABNORMAL LOW (ref >90)
Glucose, Bld: 186 mg/dL — ABNORMAL HIGH (ref 70–99)
Potassium: 3.9 mEq/L (ref 3.5–5.1)
Sodium: 135 mEq/L (ref 135–145)

## 2012-12-01 LAB — CBC
MCH: 33.5 pg (ref 26.0–34.0)
MCV: 100.7 fL — ABNORMAL HIGH (ref 78.0–100.0)
Platelets: 145 10*3/uL — ABNORMAL LOW (ref 150–400)
RDW: 18.1 % — ABNORMAL HIGH (ref 11.5–15.5)

## 2012-12-01 LAB — MAGNESIUM: Magnesium: 1.5 mg/dL (ref 1.5–2.5)

## 2012-12-01 LAB — PHOSPHORUS: Phosphorus: 3.4 mg/dL (ref 2.3–4.6)

## 2012-12-01 LAB — MRSA PCR SCREENING: MRSA by PCR: NEGATIVE

## 2012-12-01 SURGERY — EGD (ESOPHAGOGASTRODUODENOSCOPY)
Anesthesia: Moderate Sedation

## 2012-12-01 MED ORDER — PIPERACILLIN-TAZOBACTAM IN DEX 2-0.25 GM/50ML IV SOLN
2.2500 g | Freq: Three times a day (TID) | INTRAVENOUS | Status: DC
  Administered 2012-12-01 – 2012-12-02 (×4): 2.25 g via INTRAVENOUS
  Filled 2012-12-01 (×5): qty 50

## 2012-12-01 MED ORDER — CHLORHEXIDINE GLUCONATE 0.12 % MT SOLN: 15.0000 mL | Freq: Two times a day (BID) | OROMUCOSAL | Status: DC

## 2012-12-01 MED ORDER — MIDAZOLAM HCL 10 MG/2ML IJ SOLN
INTRAMUSCULAR | Status: DC | PRN
  Administered 2012-12-01 (×2): 5 mg via INTRAVENOUS

## 2012-12-01 MED ORDER — BIOTENE DRY MOUTH MT LIQD
15.0000 mL | Freq: Four times a day (QID) | OROMUCOSAL | Status: DC
  Administered 2012-12-01 – 2012-12-10 (×31): 15 mL via OROMUCOSAL

## 2012-12-01 MED ORDER — FENTANYL CITRATE 0.05 MG/ML IJ SOLN
INTRAMUSCULAR | Status: DC | PRN
  Administered 2012-12-01 (×2): 50 ug via INTRAVENOUS

## 2012-12-01 MED ORDER — FUROSEMIDE 10 MG/ML IJ SOLN
40.0000 mg | Freq: Once | INTRAMUSCULAR | Status: AC
  Administered 2012-12-01: 40 mg via INTRAVENOUS
  Filled 2012-12-01: qty 4

## 2012-12-01 MED ORDER — VANCOMYCIN HCL 500 MG IV SOLR
500.0000 mg | INTRAVENOUS | Status: DC
  Administered 2012-12-02: 500 mg via INTRAVENOUS
  Filled 2012-12-01: qty 500

## 2012-12-01 MED ORDER — BIOTENE DRY MOUTH MT LIQD: 15.0000 mL | Freq: Two times a day (BID) | OROMUCOSAL | Status: DC

## 2012-12-01 MED ORDER — SODIUM CHLORIDE 0.9 % IV BOLUS (SEPSIS)
500.0000 mL | Freq: Once | INTRAVENOUS | Status: AC
  Administered 2012-12-01: 500 mL via INTRAVENOUS

## 2012-12-01 MED ORDER — SODIUM CHLORIDE 0.9 % IV BOLUS (SEPSIS)
1000.0000 mL | Freq: Once | INTRAVENOUS | Status: AC
  Administered 2012-12-01: 1000 mL via INTRAVENOUS

## 2012-12-01 MED ORDER — PHENYLEPHRINE HCL 10 MG/ML IJ SOLN
30.0000 ug/min | INTRAVENOUS | Status: DC
  Administered 2012-12-01: 30 ug/min via INTRAVENOUS
  Administered 2012-12-02 (×2): 10 ug/min via INTRAVENOUS
  Filled 2012-12-01 (×4): qty 1

## 2012-12-01 MED ORDER — PANTOPRAZOLE SODIUM 40 MG IV SOLR
40.0000 mg | Freq: Two times a day (BID) | INTRAVENOUS | Status: DC
  Administered 2012-12-01 – 2012-12-07 (×13): 40 mg via INTRAVENOUS
  Filled 2012-12-01 (×16): qty 40

## 2012-12-01 MED ORDER — MIDAZOLAM HCL 10 MG/2ML IJ SOLN
INTRAMUSCULAR | Status: AC
  Filled 2012-12-01: qty 2

## 2012-12-01 MED ORDER — SODIUM POLYSTYRENE SULFONATE 15 GM/60ML PO SUSP
30.0000 g | Freq: Once | ORAL | Status: AC
  Administered 2012-12-01: 30 g via RECTAL
  Filled 2012-12-01: qty 120

## 2012-12-01 MED ORDER — SODIUM CHLORIDE 0.9 % IV SOLN
50.0000 ug/h | INTRAVENOUS | Status: DC
  Administered 2012-12-01: 50 ug/h via INTRAVENOUS
  Filled 2012-12-01 (×2): qty 1

## 2012-12-01 MED ORDER — SODIUM CHLORIDE 0.9 % IV SOLN
8.0000 mg/h | INTRAVENOUS | Status: DC
  Administered 2012-12-01: 8 mg/h via INTRAVENOUS
  Filled 2012-12-01 (×2): qty 80

## 2012-12-01 MED ORDER — CHLORHEXIDINE GLUCONATE 0.12 % MT SOLN
15.0000 mL | Freq: Two times a day (BID) | OROMUCOSAL | Status: DC
  Administered 2012-12-01 – 2012-12-10 (×16): 15 mL via OROMUCOSAL
  Filled 2012-12-01 (×17): qty 15

## 2012-12-01 MED ORDER — SODIUM CHLORIDE 0.9 % IV SOLN
250.0000 mg | Freq: Once | INTRAVENOUS | Status: AC
  Administered 2012-12-01: 250 mg via INTRAVENOUS
  Filled 2012-12-01: qty 250

## 2012-12-01 MED ORDER — FENTANYL CITRATE 0.05 MG/ML IJ SOLN
INTRAMUSCULAR | Status: AC
  Filled 2012-12-01: qty 2

## 2012-12-01 NOTE — Progress Notes (Signed)
Name: [name] [name] MRN: [id number] DOB: [birth year]    LOS: 1  Referring Provider:   Reason for Referral:  Aspiration pneumonia  PULMONARY / CRITICAL CARE MEDICINE  Brief patient description:  57 y/o female in rehab for EtOH problem + psych history, found to be drowsy, had bloody vomitus, likely aspirated, had fever + tachy + high WBC + hypoxia, AKI, likely severe sepsis due to asp pneumonia. Also had a fall 6/28 with left humeral fracture.  Instruments ET tube 2/28>>> PIV  Culture Blood 2/28>>> Urine 2/28>>> Sputum 2/28>>>  Abx Vanc 2/28>>> Zosyn 2/28>>>  Events Since Admission: Intubated  Current Status: Critical but stable  Vital Signs: Temp:  [99.1 F (37.3 C)-101.6 F (38.7 C)] 100.2 F (37.9 C) (06/29 0800) Pulse Rate:  [97-114] 108 (06/29 0800) Resp:  [16-20] 20 (06/29 0800) BP: (68-151)/(50-97) 123/76 mmHg (06/29 0800) SpO2:  [98 %-100 %] 98 % (06/29 0800) FiO2 (%):  [40 %-50 %] 40 % (06/29 0800) Weight:  [43 kg (94 lb 12.8 oz)] 43 kg (94 lb 12.8 oz) (06/28 2300)  Physical Examination: General:  Intubated, sedated Neuro:  No focal deficit, EOM full and equal, pupils equal but sluggish HEENT:  Atraumatic, trachea midline, ET tube well positioned Neck:  No enlarged LN, trachea midline, no trauma Cardiovascular:  RRR, tachycardic Lungs:  Decreased breath sounds bilaterally, has rhonchi bilaterally, no wheeze Abdomen:  Soft, no guarding, hyperactive bowel sounds. Coffee ground aspirate seen from NGT Musculoskeletal:  Thin and weak appearing, no extremity edema Skin:  No new rash  ASSESSMENT AND PLAN  PULMONARY  Recent Labs Lab 11/30/12 1900 11/30/12 2318 12/01/12 0420  PHART 7.303* 7.357 7.341*  PCO2ART 41.3 31.4* 30.6*  PO2ART 70.2* 91.5 86.4  HCO3 19.8* 16.9* 16.1*  O2SAT 92.0 96.6 96.5   Ventilator Settings: PRVC 20, 400, 40%, +5 CXR:  Multifocal opacities suspicious for pneumonia ETT:  Repeat CXR pending  A:  Hypoxia due to aspiration  pneumonia, metabolic acidosis with no compensation P:   - Maintain on full vent support for now until GI issues are resolved. - F/U ABG and CXR in AM.  CARDIOVASCULAR  Recent Labs Lab 11/30/12 1806  LATICACIDVEN 1.09   ECG:  No tall peaked t-wave, sinus tachy Lines: peripheral  A: sinus tachy due to sepsis P:  - Supportive treatment, IVF.  RENAL  Recent Labs Lab 11/24/12 1835  11/30/12 1744 11/30/12 2000 12/01/12 0230  NA 133*  --  130*  --  132*  K 6.2*  < > 6.7* 4.0 5.4*  CL 105  --  100  --  104  CO2 12*  --  19  --  15*  BUN 16  --  40*  --  36*  CREATININE 1.34*  --  2.46*  --  2.08*  CALCIUM 7.5*  --  9.0  --  7.9*  MG  --   --   --   --  1.5  PHOS  --   --   --   --  3.4  < > = values in this interval not displayed. Intake/Output     06/28 0701 - 06/29 0700 06/29 0701 - 06/30 0700   I.V. (mL/kg) 530.6 (12.3) 75 (1.7)   Other  20   NG/GT  30   IV Piggyback 550    Total Intake(mL/kg) 1080.6 (25.1) 125 (2.9)   Urine (mL/kg/hr) 325 170 (1.4)   Emesis/NG output 800    Total Output 1125 170   Net -44.4 -  45         Foley:  yes  A:  AKI likely due to sepsis, dehydration P:   - IVF resuscitation, monitor Cr, monitor K. - Will give a kayexalate enema and fluid and lasix x1.  GASTROINTESTINAL  Recent Labs Lab 11/24/12 1835 11/30/12 1744  AST 148* 31  ALT 25 8  ALKPHOS 462* 242*  BILITOT 0.3 0.2*  PROT 10.4* 8.7*  ALBUMIN 2.6* 2.0*   A:  Likely UGI bleed, unknown if has varices or simply gastritis P:   - GI consult called, PPI drip, octreotide, may need EGD, will defer to GI.  HEMATOLOGIC  Recent Labs Lab 11/24/12 1835 11/30/12 1744 12/01/12 0230  HGB 10.2* 7.4* 9.3*  HCT 31.0* 23.6* 28.0*  PLT 311 184 145*  INR  --  1.25  --   APTT  --  40*  --    A:  Anemia + leukocytosis, likely from UGI bleed + sepsis/PNA P:  - Monitor Hb, if lower than 7 then will transfuse. - Abx as ordered.  INFECTIOUS  Recent Labs Lab 11/24/12 1835  11/30/12 1744 12/01/12 0230  WBC 8.7 20.2* 19.7*   Cultures: Pending Antibiotics: Zosyn + vancomycin  A:  Likely aspiration pneumonia P:   Zosyn + vanco until culture results come in.  ENDOCRINE  Recent Labs Lab 11/29/12 0624 11/30/12 1752 12/01/12 0112 12/01/12 0725  GLUCAP 92 83 150* 108*   A:  Mildly hypoglycemic, probably from sepsis and poor PO intake P:   - D5NS and monitor blood sugar.  NEUROLOGIC  A:  Altered mental status, probably multifactorial: sepsis/PNA, psych history, maybe some component from the tramadol P:   - Sedation as needed.  BEST PRACTICE / DISPOSITION Level of Care:  Critical Care MICU Primary Service:  PCCM Consultants:  GI Code Status:  Full Diet:  NPO for now DVT Px:  SCD GI Px:  Protonix drip Skin Integrity:  Routine care Social / Family:  Sister and daughter  Critical care time: 35 minutes  Koren Bound, M.D. Pulmonary and Critical Care Medicine [facility] Pager: [phone]  12/01/2012, 9:46 AM

## 2012-12-01 NOTE — Op Note (Addendum)
[facility] ,   ENDOSCOPY PROCEDURE REPORT  PATIENT: [name] [name], [name] [name]  MR#: [id number] BIRTHDATE: Aug 20, 1955 , 56  yrs. old GENDER: Female ENDOSCOPIST: Roxy Cedar, MD REFERRED BY:  [facility] PROCEDURE DATE:  12/01/2012 PROCEDURE:  EGD, diagnostic ASA CLASS:     Class IV INDICATIONS:  Hematemesis. MEDICATIONS: Per RN report TOPICAL ANESTHETIC: none  DESCRIPTION OF PROCEDURE: After the risks benefits and alternatives of the procedure were thoroughly explained, informed consent was obtained.  The Pentax Gastroscope [id number] endoscope was introduced through the mouth and advanced to the second portion of the duodenum. Without limitations.  The instrument was slowly withdrawn as the mucosa was fully examined.      EXAM: BLOOD IN MOUTH (source unclear).  Normal esophagus.  NO VARICES.Small Mallory Weiss tear, nonbleeding) - cardia.  NG tube trauma, o/w normal stomach.  Normal duodenum.  NO BLOOD IN UPPER GUT.  Retroflexed views revealed a Mallory-Weiss tear.     The scope was then withdrawn from the patient and the procedure completed.  COMPLICATIONS: There were no complications. ENDOSCOPIC IMPRESSION: 1. BLOOD IN MOUTH (source unclear). 2.Small Mallory Weiss tear, nonbleeding) -  NO BLOOD IN UPPER GUT. Otherwise normal EGD  RECOMMENDATIONS: 1.  Continue PPI; Stop octreotide 2.  ICU monitoring per CCM.  Will sign off  REPEAT EXAM:  eSigned:  Roxy Cedar, MD 12/01/2012 2:29 PM   CC:

## 2012-12-01 NOTE — Progress Notes (Signed)
eLink Physician-Brief Progress Note Patient Name: [name] [name] DOB: 03/22/1956 MRN: [id number]  Date of Service  12/01/2012   HPI/Events of Note   Remains hypotensive inspite of stopping precedex  eICU Interventions  NS bolus  start neo gtt   Intervention Category Intermediate Interventions: Hypotension - evaluation and management  ALVA,RAKESH V. 12/01/2012, 2:45 AM

## 2012-12-01 NOTE — Progress Notes (Signed)
eLink Physician-Brief Progress Note Patient Name: [name] [name] DOB: [birth year] MRN: [id number]  Date of Service  12/01/2012   HPI/Events of Note   Hypotensive after procedural sedation  eICU Interventions   NS 1000 x 1 Cortisol level    Intervention Category Major Interventions: Hypotension - evaluation and management  Jeanpaul Biehl 12/01/2012, 4:52 PM

## 2012-12-01 NOTE — Progress Notes (Signed)
INITIAL NUTRITION ASSESSMENT  DOCUMENTATION CODES Per approved criteria  -Severe  malnutrition in the context of social or environmental circumstances -Underweight   Patient meets criteria for SEVERE MALNUTRITION related to social/environmental causes AEB 31% weight loss in the past year and a decrease of body fat and muscle mass.  INTERVENTION: Recommend initiation of Vital AF 1.2 per OG tube at 15 ml/hr and increase 10 ml every 4 hours to goal of 35 ml/hr to provide:  1008 kcal, 63 gm protein, 680 ml free water.  NUTRITION DIAGNOSIS: Inadequate oral intake related to inability to eat as evidenced by npo status..   Goal: Meet >90% estimated needs with enteral nutrition.  Monitor:  TF initiation and tolerance, diet initiation, labs, weight  Reason for Assessment: Consult for nutritional assessment and TF recommendations.  57 y.o. female  Admitting Dx:   Hypoxia due to aspiration pne, metabolic acidosis with no compensation, sepsis, AKI, dehydration, UGI bleed, anemia, AMS.  ASSESSMENT: Patient known to me from recent admissions.  Last seen by this RD 5/24 at [facility].  Patient was eating well with good appetite at that time  Patient also was drinking Ensure Complete.  Currently intubated receiving mechanical ventilation.  Patient is s/p endoscopy today which showed an unclear source of blood in mouth, a small non bleeding Mallory Weiss tear).    Height: Ht Readings from Last 1 Encounters:  11/30/12 5\' 3"  (1.6 m)    Weight: Wt Readings from Last 1 Encounters:  11/30/12 94 lb 12.8 oz (43 kg)    Ideal Body Weight: 115 lbs  % Ideal Body Weight: 81  Wt Readings from Last 10 Encounters:  11/30/12 94 lb 12.8 oz (43 kg)  11/30/12 94 lb 12.8 oz (43 kg)  11/30/12 94 lb 12.8 oz (43 kg)  11/26/12 84 lb (38.102 kg)  11/12/12 93 lb 4.8 oz (42.321 kg)  11/06/12 83 lb (37.649 kg)  11/06/12 83 lb (37.649 kg)  11/06/12 83 lb 12.8 oz (38.011 kg)  10/30/12 83 lb 3.2 oz (37.739 kg)   10/08/12 87 lb 6.4 oz (39.644 kg)    Usual Body Weight:  99 lbs  % Usual Body Weight: 95  BMI:  Body mass index is 16.8 kg/(m^2).  Estimated Nutritional Needs: Kcal: 1068 Protein: 60-70 gm  Fluid: 1.2L  Skin: wnl  Diet Order:  npo  EDUCATION NEEDS: -No education needs identified at this time   Intake/Output Summary (Last 24 hours) at 12/01/12 1539 Last data filed at 12/01/12 1500  Gross per 24 hour  Intake 3247.09 ml  Output   1820 ml  Net 1427.09 ml    Last BM: unknown   Labs:   Recent Labs Lab 11/24/12 1835  11/30/12 1744 11/30/12 2000 12/01/12 0230  NA 133*  --  130*  --  132*  K 6.2*  < > 6.7* 4.0 5.4*  CL 105  --  100  --  104  CO2 12*  --  19  --  15*  BUN 16  --  40*  --  36*  CREATININE 1.34*  --  2.46*  --  2.08*  CALCIUM 7.5*  --  9.0  --  7.9*  MG  --   --   --   --  1.5  PHOS  --   --   --   --  3.4  GLUCOSE 118*  --  97  --  129*  < > = values in this interval not displayed.  CBG (last 3)  Recent Labs  12/01/12 0112 12/01/12 0725 12/01/12 1158  GLUCAP 150* 108* 113*    Scheduled Meds: . antiseptic oral rinse  15 mL Mouth Rinse QID  . chlorhexidine  15 mL Mouth Rinse BID  . insulin aspart  2-6 Units Subcutaneous Q4H  . pantoprazole (PROTONIX) IV  40 mg Intravenous Q12H  . piperacillin-tazobactam (ZOSYN)  IV  2.25 g Intravenous Q8H  . [START ON 12/02/2012] vancomycin  500 mg Intravenous Q24H    Continuous Infusions: . dexmedetomidine 0.6 mcg/kg/hr (12/01/12 1310)  . dextrose 5 % and 0.9% NaCl 75 mL/hr at 12/01/12 0010  . phenylephrine (NEO-SYNEPHRINE) Adult infusion 50 mcg/min (12/01/12 1500)    Past Medical History  Diagnosis Date  . Anemia, B12 deficiency   . History of acute pancreatitis   . Right knee pain     No recent imaging on chart  . 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 Dr. Su Hilt, [facility]  . Hypertriglyceridemia   . GERD  (gastroesophageal reflux disease)   . Vitamin D deficiency   . Subdural hematoma 02/2008    Likely 2/2 trauma from seizure from EtOH withdrawal, chronic in nature, sees Dr. Robyne Askew. Most recent CT head 10/2009 showing stable but persistent hematoma without mass effect.  . History of seizure disorder     Likely 2/2 alcohol abuse  . Hypocalcemia   . Hypomagnesemia   . Failure to thrive in childhood     Unclear etiology  . HTN (hypertension)   . Thrombocytopenia   . Anemia, macrocytic   . Hepatomegaly     On exam  . Alcohol abuse   . Joint pain   . Alcohol abuse   . Arthritis   . Vitamin D deficiency   . Menopause   . Pancreatitis   . Insomnia   . Hyperlipidemia   . Sinusitis   . Pernicious anemia   . Subdural hematoma   . Macrocytic anemia   . Hepatomegaly   . 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 Seizures     1.5 YRS  LAST ONE  . Depression   . Fx humeral neck 04/17/2011    Transverse fracture- minimally displaced- managed as outpatient   . ABNORMAL PAP SMEAR, LGSIL 07/23/2008    Annotation: HPV positive CIN I Dr. Su Hilt, [facility] Qualifier: Diagnosis of  By: Danae Chen    . Pneumonia 05/20/2012  . Hepatitis     HEPATOMEGALY     Past Surgical History  Procedure Laterality Date  . Cesarean section  1983  . Esophagogastroduodenoscopy  07/11/2011    Procedure: ESOPHAGOGASTRODUODENOSCOPY (EGD);  Surgeon: Theda Belfast, MD;  Location: [facility];  Service: Endoscopy;  Laterality: N/A;  . Colonoscopy  07/11/2011    Procedure: COLONOSCOPY;  Surgeon: Theda Belfast, MD;  Location: [facility];  Service: Endoscopy;  Laterality: N/A;  . Eye surgery      LEFT EYE YRS AGO   . Rt colectomy  08/28/2011    Oran Rein, RD, LDN Clinical Inpatient Dietitian Pager:  [phone] Weekend and after hours pager:  [phone]

## 2012-12-01 NOTE — Consult Note (Signed)
Referring Provider: Dr. Molli Knock Primary Care Physician:  Aletta Edouard, MD Primary Gastroenterologist:  Dr.Hung  Reason for Consultation:  Sandria Manly Bleed  HPI: [name] [name] is a 57 y.o. female  alcoholic with multiple medical problems and frequent hospitalizations and admission to behavioral health. She has had a couple of admissions to behavioral health over the past month and was admitted there on 11/26/2012 for detox. Apparently she had a fall on 11/29/2012 and then within the next 24 hours had been noted to have an episode of bloody emesis and then developed lethargy and fever and was brought to the emergency room. It is felt that she aspirated And now is being managed for severe sepsis due to aspiration pneumonia. She did sustain a left humeral fracture at the time of her fall. Patient has been intubated, was initially on pressors for hypotension which have since been weaned off, however her blood pressure is hovering in the low 90s high 80s . She had an NG tube placed yesterday evening and had according to the nurse 700 cc of dark blood return. Since that time she's had about 100 cc of NG drainage of thinner dark red blood. She has not had any bowel movements and stool was Hemoccult negative on admission. Hemoglobin was noted to be 10.2 with hematocrit of 31 and platelets of 3's 10 on 11/24/2012, yesterday hemoglobin 7.4 hematocrit of 23.6 and after 2 unit blood transfusion hemoglobin is 9.3 with hematocrit of 28 today. Patient is known to Dr. Elnoria Howard and had colonoscopy and EGD done in February of 2013 for evaluation of iron deficiency anemia. 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 EGD at that same time showed gastritis and duodenitis, no varices.   Past Medical History  Diagnosis Date  . Anemia, B12 deficiency   . History of acute pancreatitis   . Right knee pain     No recent imaging on chart  . Abnormal Pap smear and cervical  HPV (human papillomavirus)     CN1. LGSIL-HPV positive. Dr. Su Hilt, [facility]  . Hypertriglyceridemia   . GERD (gastroesophageal reflux disease)   . Vitamin D deficiency   . Subdural hematoma 02/2008    Likely 2/2 trauma from seizure from EtOH withdrawal, chronic in nature, sees Dr. Robyne Askew. Most recent CT head 10/2009 showing stable but persistent hematoma without mass effect.  . History of seizure disorder     Likely 2/2 alcohol abuse  . Hypocalcemia   . Hypomagnesemia   . Failure to thrive in childhood     Unclear etiology  . HTN (hypertension)   . Thrombocytopenia   . Anemia, macrocytic   . Hepatomegaly     On exam  . Alcohol abuse   . Joint pain   . Alcohol abuse   . Arthritis   . Vitamin D deficiency   . Menopause   . Pancreatitis   . Insomnia   . Hyperlipidemia   . Sinusitis   . Pernicious anemia   . Subdural hematoma   . Macrocytic anemia   . Hepatomegaly   . 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 Seizures     1.5 YRS  LAST ONE  . Depression   . Fx humeral neck 04/17/2011    Transverse fracture- minimally displaced- managed as outpatient   . ABNORMAL PAP SMEAR, LGSIL 07/23/2008    Annotation: HPV positive CIN I Dr. Su Hilt, [facility] Qualifier: Diagnosis of  By: Danae Chen    . Pneumonia 05/20/2012  . Hepatitis     HEPATOMEGALY     Past Surgical History  Procedure Laterality Date  . Cesarean section  1983  . Esophagogastroduodenoscopy  07/11/2011    Procedure: ESOPHAGOGASTRODUODENOSCOPY (EGD);  Surgeon: Theda Belfast, MD;  Location: [facility];  Service: Endoscopy;  Laterality: N/A;  . Colonoscopy  07/11/2011    Procedure: COLONOSCOPY;  Surgeon: Theda Belfast, MD;  Location: [facility];  Service: Endoscopy;  Laterality: N/A;  . Eye surgery      LEFT EYE YRS AGO   . Rt colectomy  08/28/2011    Prior to Admission medications   Medication Sig Start Date End Date Taking? Authorizing Provider  acetaminophen  (TYLENOL) 500 MG tablet Take 500 mg by mouth at bedtime as needed for pain (sleep).   Yes Historical Provider, MD  amLODipine (NORVASC) 10 MG tablet Take 0.5 tablets (5 mg total) by mouth daily. For hypertension 11/12/12  Yes Aletta Edouard, MD  amylase-lipase-protease (PANGESTYME EC) 20-4.5-25 MU per capsule Take 1 capsule by mouth 3 (three) times daily with meals. For low pancreatic function 11/11/12  Yes Sanjuana Kava, NP  aspirin EC 81 MG tablet Take 81 mg by mouth every evening.   Yes Historical Provider, MD  calcium carbonate (TUMS) 500 MG chewable tablet Chew 6 tablets (1,200 mg of elemental calcium total) by mouth 3 (three) times daily. For bone health 11/11/12  Yes Sanjuana Kava, NP  ferrous sulfate 325 (65 FE) MG tablet Take 325 mg by mouth 2 (two) times daily with a meal. 11/11/12  Yes Sanjuana Kava, NP  FLUoxetine (PROZAC) 10 MG capsule Take 1 capsule (10 mg total) by mouth daily. For depression 11/11/12  Yes Sanjuana Kava, NP  folic acid (FOLVITE) 1 MG tablet Take 1 tablet (1 mg total) by mouth daily. For folic acid replacement 11/11/12  Yes Sanjuana Kava, NP  furosemide (LASIX) 20 MG tablet Take 1 tablet (20 mg total) by mouth daily. 11/12/12 11/12/13 Yes Aletta Edouard, MD  gabapentin (NEURONTIN) 300 MG capsule Take 2 capsules (600 mg total) by mouth 3 (three) times daily. For anxiety/pain control 11/11/12  Yes Sanjuana Kava, NP  hydrochlorothiazide (HYDRODIURIL) 25 MG tablet Take 1 tablet (25 mg total) by mouth daily. For high blood pressure control 11/11/12  Yes Sanjuana Kava, NP  magnesium oxide (MAG-OX) 400 MG tablet Take 400 mg by mouth 3 (three) times daily.   Yes Historical Provider, MD  Multiple Vitamin (MULTIVITAMIN WITH MINERALS) TABS Take 1 tablet by mouth daily. For vitamin replacement 11/11/12  Yes Sanjuana Kava, NP  Nutritional Supplements (ENSURE HIGH PROTEIN PO) Take 1 Can by mouth 3 (three) times daily. chocolate   Yes Historical Provider, MD  omeprazole (PRILOSEC) 40 MG capsule Take 1  capsule (40 mg total) by mouth daily. 10/08/12  Yes Aletta Edouard, MD  oxyCODONE-acetaminophen (PERCOCET) 5-325 MG per tablet Take 1 tablet by mouth every 6 (six) hours as needed for pain. 11/29/12  Yes Richardean Canal, MD  potassium chloride SA (KLOR-CON M20) 20 MEQ tablet Take 1 tablet (20 mEq total) by mouth daily. For low potassium 11/12/12  Yes Aletta Edouard, MD  thiamine (VITAMIN B-1) 100 MG tablet Take 1 tablet (100 mg total) by mouth daily. For low thiamine 11/11/12  Yes Sanjuana Kava, NP  vitamin B-12 (CYANOCOBALAMIN) 250 MCG tablet Take 250 mcg by mouth every  evening.   Yes Historical Provider, MD  Vitamin D, Ergocalciferol, (DRISDOL) 50000 UNITS CAPS Take 1 capsule (50,000 Units total) by mouth every 7 (seven) days. Thursdays: For bone health 11/11/12  Yes Sanjuana Kava, NP  VOLTAREN 1 % GEL Apply 1 application topically 3 (three) times daily as needed (pain).  11/04/12  Yes Historical Provider, MD    Current Facility-Administered Medications  Medication Dose Route Frequency Provider Last Rate Last Dose  . 0.9 %  sodium chloride infusion  250 mL Intravenous PRN Wadie Lessen, MD      . antiseptic oral rinse (BIOTENE) solution 15 mL  15 mL Mouth Rinse QID Lonia Farber, MD      . chlorhexidine (PERIDEX) 0.12 % solution 15 mL  15 mL Mouth Rinse BID Lonia Farber, MD   15 mL at 12/01/12 0750  . dexmedetomidine (PRECEDEX) 200 MCG/50ML infusion  0.4-1.2 mcg/kg/hr Intravenous Titrated Wadie Lessen, MD   0.4 mcg/kg/hr at 12/01/12 0011  . dextrose 5 %-0.9 % sodium chloride infusion   Intravenous Continuous Wadie Lessen, MD 75 mL/hr at 12/01/12 0010    . fentaNYL (SUBLIMAZE) injection 50-100 mcg  50-100 mcg Intravenous Q2H PRN Wadie Lessen, MD      . furosemide (LASIX) injection 40 mg  40 mg Intravenous Once Alyson Reedy, MD      . insulin aspart (novoLOG) injection 2-6 Units  2-6 Units Subcutaneous Q4H Wadie Lessen, MD   2 Units at 12/01/12 0100  .  octreotide (SANDOSTATIN) 2 mcg/mL in sodium chloride 0.9 % 250 mL infusion  50 mcg/hr Intravenous Continuous Alyson Reedy, MD      . pantoprazole (PROTONIX) 80 mg in sodium chloride 0.9 % 250 mL infusion  8 mg/hr Intravenous Continuous Alyson Reedy, MD      . phenylephrine (NEO-SYNEPHRINE) 10,000 mcg in dextrose 5 % 250 mL infusion  30-200 mcg/min Intravenous Continuous Oretha Milch, MD 22.5 mL/hr at 12/01/12 0600 15 mcg/min at 12/01/12 0600  . piperacillin-tazobactam (ZOSYN) IVPB 2.25 g  2.25 g Intravenous Q8H Konstantin Zubelevitskiy, MD   2.25 g at 12/01/12 0518  . sodium chloride 0.9 % bolus 1,000 mL  1,000 mL Intravenous Once Alyson Reedy, MD      . sodium polystyrene (KAYEXALATE) 15 GM/60ML suspension 30 g  30 g Rectal Once Alyson Reedy, MD      . Melene Muller ON 12/02/2012] vancomycin (VANCOCIN) 500 mg in sodium chloride 0.9 % 100 mL IVPB  500 mg Intravenous Q24H Lonia Farber, MD        Allergies as of 11/30/2012 - Review Complete 11/30/2012  Allergen Reaction Noted  . Amitriptyline hcl Swelling   . Doxycycline hyclate Itching 11/10/2010    Family History  Problem Relation Age of Onset  . Cancer Mother     Died from stomach cancer and "flesh eating rash  . Heart failure Father     Died in 39s from an MI  . Alcohol abuse Sister     Twin sister drinks a lot, as did both her parents and brothers  . Stroke Brother     Has 7 brothers, 1 with CVA    History   Social History  . Marital Status: Divorced    Spouse Name: N/A    Number of Children: N/A  . Years of Education: N/A   Occupational History  . Not on file.   Social History Main Topics  . Smoking status: Former Smoker    Types: Cigarettes  Quit date: 09/20/2010  . Smokeless tobacco: Never Used  . Alcohol Use: 6.0 oz/week    10 Glasses of wine per week     Comment: No alocohol x 3 weeks  . Drug Use: No     Comment: HX  USE   . Sexually Active: Not Currently   Other Topics Concern  . Not on file    Social History Narrative   Lives with her significant other and 2 grandchildren. 1 child   Has 7 brothers and 4 sisters, 1 twin sister.   Unemployed, worked in [employer].    Abuses alcohol-drinks 1 glass of wine daily    No drug use. Former cigarette use quit 1.5 years ago.     11 th grade education             Review of Systems: Pertinent positive and negative review of systems were noted in the above HPI section.  All other review of systems was otherwise negative.  Physical Exam: Vital signs in last 24 hours: Temp:  [99.1 F (37.3 C)-101.6 F (38.7 C)] 100.2 F (37.9 C) (06/29 0800) Pulse Rate:  [97-115] 115 (06/29 1000) Resp:  [16-20] 16 (06/29 1000) BP: (68-151)/(50-97) 87/68 mmHg (06/29 1000) SpO2:  [98 %-100 %] 98 % (06/29 1000) FiO2 (%):  [40 %-50 %] 40 % (06/29 1000) Weight:  [94 lb 12.8 oz (43 kg)] 94 lb 12.8 oz (43 kg) (06/28 2300) Last BM Date:  (unknown) General:   Alert,  Well-developed, thin chronically ill appearing AA female-intubated and sedatedHead:  Normocephalic and atraumatic. Eyes:  Sclera clear, no icterus.   Conjunctiva pale. Ears:  Normal auditory acuity. Nose:  No deformity, discharge,  or lesions. NG tube is draining a small amount of dark red blood Mouth:  No deformity or lesions.   Neck:  Supple; no masses or thyromegaly. Lungs:  Coarse breath sounds bilaterally  Heart: Tachycardic Regular rate and rhythm; no murmurs, clicks, rubs,  or gallops. Abdomen:  Soft,nontender, BS active,nonpalp mass or hsm.   Rectal:  Deferred  Msk:  Symmetrical without gross deformities. . Pulses:  Normal pulses noted. Extremities:  Without clubbing or edema. Neurologic:  Alert and  oriented x4;  grossly normal neurologically. Skin:  Intact without significant lesions or rashes.. Psych: Intubated and sedated   Intake/Output from previous day: 06/28 0701 - 06/29 0700 In: 1080.6 [I.V.:530.6; IV Piggyback:550] Out: 1125 [Urine:325; Emesis/NG  output:800] Intake/Output this shift: Total I/O In: 125 [I.V.:75; Other:20; NG/GT:30] Out: 170 [Urine:170]  Lab Results:  Recent Labs  11/30/12 1744 12/01/12 0230  WBC 20.2* 19.7*  HGB 7.4* 9.3*  HCT 23.6* 28.0*  PLT 184 145*   BMET  Recent Labs  11/30/12 1744 11/30/12 2000 12/01/12 0230  NA 130*  --  132*  K 6.7* 4.0 5.4*  CL 100  --  104  CO2 19  --  15*  GLUCOSE 97  --  129*  BUN 40*  --  36*  CREATININE 2.46*  --  2.08*  CALCIUM 9.0  --  7.9*   LFT  Recent Labs  11/30/12 1744  PROT 8.7*  ALBUMIN 2.0*  AST 31  ALT 8  ALKPHOS 242*  BILITOT 0.2*   PT/INR  Recent Labs  11/30/12 1744  LABPROT 15.4*  INR 1.25        Studies/Results: Dg Chest 1 View  11/30/2012   *RADIOLOGY REPORT*  Clinical Data: Post intubation  CHEST - 1 VIEW  Comparison: Earlier same day  Findings:  Grossly unchanged cardiac silhouette and mediastinal contours. Interval intubation with endotracheal tube overlying the tracheal air column with tip approximately 4.4 cm above the carina.  Enteric tube tip and side port project over the expected location of the gastric fundus.  Minimally improved aeration of the lungs with persistent right mid and left lower heterogeneous air space opacities.  No new focal airspace opacities.  No pleural effusion or pneumothorax.  No definite evidence of edema.  Unchanged bones.  IMPRESSION: 1.  Interval intubation with endotracheal tube overlying tracheal air column with tip approximately 4.4 cm above the carina.  No pneumothorax. 2.  Improved aeration of the lungs with persistent right mid and left lower lung airspace opacities worrisome for multifocal infection.   Original Report Authenticated By: Tacey Ruiz, MD   Ct Head Wo Contrast  11/30/2012   *RADIOLOGY REPORT*  Clinical Data: Altered mental status.  Unresponsive.  CT HEAD WITHOUT CONTRAST  Technique:  Contiguous axial images were obtained from the base of the skull through the vertex without  contrast.  Comparison: 09/06/2011  Findings: The brain shows generalized atrophy.  There is no evidence of old or acute focal infarction, mass lesion, hemorrhage, hydrocephalus or extra-axial collection.  The calvarium is unremarkable.  Sinuses, middle ears and mastoids are clear.  IMPRESSION: Atrophy.  No focal or acute finding.   Original Report Authenticated By: Paulina Fusi, M.D.   Dg Chest Port 1 View  12/01/2012   *RADIOLOGY REPORT*  Clinical Data: Aspiration pneumonia  PORTABLE CHEST - 1 VIEW  Comparison: Yesterday  Findings: Endotracheal tube and NG tube are stable.  Bilateral patchy airspace disease is stable.  No pneumothorax.  Normal heart size.  IMPRESSION: Stable patchy bilateral airspace disease.   Original Report Authenticated By: Jolaine Click, M.D.   Dg Chest Port 1 View  11/30/2012   *RADIOLOGY REPORT*  Clinical Data: Hypoxia, altered mental status  PORTABLE CHEST - 1 VIEW  Comparison: 08/07/2012; 05/20/2012  Findings:  Grossly unchanged cardiac silhouette and mediastinal contours given slightly reduced lung volumes.  An enteric tube tip and side port projects below the left hemidiaphragm.  Interval development of heterogeneous air space opacities within the peripheral aspect the right mid lung as well as the left lower / retrocardiac lung.  No definite pleural effusion or pneumothorax.  No definite evidence of edema.  Old/healed fracture involving the surgical neck of the right humerus. No acute osseous abnormalities.  IMPRESSION: Bilateral heterogeneous air space opacities worrisome for multifocal infection.  A follow-up chest radiograph in 4 to 6 weeks after treatment is recommended to ensure resolution.   Original Report Authenticated By: Tacey Ruiz, MD    IMPRESSION:  #24 57 year old African American female alcoholic with aspiration pneumonia bilateral and severe sepsis. Patient is intubated secondary to hypoxia #2 upper GI bleed with probable aspiration of blood. Doubt this  represents a variceal hemorrhage but cannot rule out, she may have a Mallory-Weiss tear, ulcer disease or portal gastropathy. 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 Marina Goodell #2 serial hemoglobins and transfuse to keep hemoglobin at least 8 #3 IV Protonix infusion has been started #4 IV octreotide started this a.m.   Amy Esterwood  12/01/2012, 10:18 AM  GI ATTENDING  Patient personally seen and  examined. Relevant data reviewed. Agree with history and physical as outlined above. Patient admitted with multiple problems after a fall. Intubated with aspiration pneumonia. Some level of upper GI bleeding which does not appear to be overwhelming at this time. I doubt she has significant liver disease despite her alcohol history, laboratories, and most recent radiology. No varices on endoscopy last year. However, will proceed with urgent upper endoscopy at the bedside to clarify recent upper GI bleed.  Wilhemina Bonito. Eda Keys., M.D. [facility] Division of Gastroenterology

## 2012-12-01 NOTE — Progress Notes (Signed)
eLink Physician-Brief Progress Note Patient Name: [name] [name] DOB: [id number] MRN: [id number]  Date of Service  12/01/2012   HPI/Events of Note  Received 1U PRBC for Hb 7.4   eICU Interventions  Hold further PRBCs until rpt Hb check, target 7 & above   Intervention Category Intermediate Interventions: Bleeding - evaluation and treatment with blood products  ALVA,RAKESH V. 12/01/2012, 12:25 AM

## 2012-12-02 ENCOUNTER — Ambulatory Visit: Payer: PRIVATE HEALTH INSURANCE | Admitting: Family Medicine

## 2012-12-02 ENCOUNTER — Inpatient Hospital Stay (HOSPITAL_COMMUNITY): Payer: PRIVATE HEALTH INSURANCE

## 2012-12-02 ENCOUNTER — Encounter (HOSPITAL_COMMUNITY): Payer: Self-pay | Admitting: Internal Medicine

## 2012-12-02 DIAGNOSIS — J690 Pneumonitis due to inhalation of food and vomit: Secondary | ICD-10-CM | POA: Diagnosis present

## 2012-12-02 DIAGNOSIS — K922 Gastrointestinal hemorrhage, unspecified: Secondary | ICD-10-CM | POA: Diagnosis present

## 2012-12-02 DIAGNOSIS — J9600 Acute respiratory failure, unspecified whether with hypoxia or hypercapnia: Secondary | ICD-10-CM

## 2012-12-02 LAB — BLOOD GAS, ARTERIAL
Acid-base deficit: 9.9 mmol/L — ABNORMAL HIGH (ref 0.0–2.0)
Acid-base deficit: 11.2 mmol/L — ABNORMAL HIGH (ref 0.0–2.0)
Bicarbonate: 14.4 mEq/L — ABNORMAL LOW (ref 20.0–24.0)
Bicarbonate: 14.5 mEq/L — ABNORMAL LOW (ref 20.0–24.0)
FIO2: 0.4 %
MECHVT: 420 mL
Patient temperature: 37
Patient temperature: 37
TCO2: 13.8 mmol/L (ref 0–100)
TCO2: 14 mmol/L (ref 0–100)
pCO2 arterial: 27.1 mmHg — ABNORMAL LOW (ref 35.0–45.0)
pCO2 arterial: 33.1 mmHg — ABNORMAL LOW (ref 35.0–45.0)
pH, Arterial: 7.344 — ABNORMAL LOW (ref 7.350–7.450)
pH, Arterial: 7.263 — ABNORMAL LOW (ref 7.350–7.450)

## 2012-12-02 LAB — PROCALCITONIN: Procalcitonin: 3.25 ng/mL

## 2012-12-02 LAB — GLUCOSE, CAPILLARY
Glucose-Capillary: 121 mg/dL — ABNORMAL HIGH (ref 70–99)
Glucose-Capillary: 149 mg/dL — ABNORMAL HIGH (ref 70–99)
Glucose-Capillary: 200 mg/dL — ABNORMAL HIGH (ref 70–99)
Glucose-Capillary: 81 mg/dL (ref 70–99)
Glucose-Capillary: 92 mg/dL (ref 70–99)

## 2012-12-02 LAB — MAGNESIUM: Magnesium: 1 mg/dL — ABNORMAL LOW (ref 1.5–2.5)

## 2012-12-02 LAB — BASIC METABOLIC PANEL
CO2: 16 mEq/L — ABNORMAL LOW (ref 19–32)
Calcium: 7.4 mg/dL — ABNORMAL LOW (ref 8.4–10.5)
Glucose, Bld: 85 mg/dL (ref 70–99)
Potassium: 3.8 mEq/L (ref 3.5–5.1)
Sodium: 135 mEq/L (ref 135–145)

## 2012-12-02 LAB — CBC
Hemoglobin: 7.9 g/dL — ABNORMAL LOW (ref 12.0–15.0)
MCH: 32.9 pg (ref 26.0–34.0)
Platelets: 186 10*3/uL (ref 150–400)
RBC: 2.4 MIL/uL — ABNORMAL LOW (ref 3.87–5.11)
WBC: 38.7 10*3/uL — ABNORMAL HIGH (ref 4.0–10.5)

## 2012-12-02 LAB — CORTISOL: Cortisol, Plasma: 10.8 ug/dL

## 2012-12-02 LAB — PHOSPHORUS: Phosphorus: 3.6 mg/dL (ref 2.3–4.6)

## 2012-12-02 MED ORDER — VANCOMYCIN HCL 500 MG IV SOLR: 500.0000 mg | INTRAVENOUS | Status: DC

## 2012-12-02 MED ORDER — SODIUM CHLORIDE 0.9 % IV BOLUS (SEPSIS)
1000.0000 mL | Freq: Once | INTRAVENOUS | Status: AC
  Administered 2012-12-02: 1000 mL via INTRAVENOUS

## 2012-12-02 MED ORDER — IPRATROPIUM BROMIDE 0.02 % IN SOLN
0.5000 mg | Freq: Four times a day (QID) | RESPIRATORY_TRACT | Status: DC
  Administered 2012-12-02 – 2012-12-03 (×5): 0.5 mg via RESPIRATORY_TRACT
  Filled 2012-12-02 (×5): qty 2.5

## 2012-12-02 MED ORDER — DEXTROSE 5 % IV SOLN
INTRAVENOUS | Status: DC
  Administered 2012-12-02: 16:00:00 via INTRAVENOUS
  Filled 2012-12-02 (×2): qty 150

## 2012-12-02 MED ORDER — PIPERACILLIN-TAZOBACTAM 3.375 G IVPB
3.3750 g | Freq: Three times a day (TID) | INTRAVENOUS | Status: DC
  Administered 2012-12-02 – 2012-12-04 (×6): 3.375 g via INTRAVENOUS
  Filled 2012-12-02 (×7): qty 50

## 2012-12-02 MED ORDER — VANCOMYCIN HCL IN DEXTROSE 750-5 MG/150ML-% IV SOLN
750.0000 mg | INTRAVENOUS | Status: DC
  Administered 2012-12-02 – 2012-12-03 (×2): 750 mg via INTRAVENOUS
  Filled 2012-12-02 (×2): qty 150

## 2012-12-02 MED ORDER — ALBUTEROL SULFATE (5 MG/ML) 0.5% IN NEBU: 2.5000 mg | INHALATION_SOLUTION | RESPIRATORY_TRACT | Status: DC | PRN

## 2012-12-02 MED ORDER — HYDROCORTISONE SOD SUCCINATE 100 MG IJ SOLR
50.0000 mg | Freq: Four times a day (QID) | INTRAMUSCULAR | Status: DC
  Administered 2012-12-02 – 2012-12-04 (×8): 50 mg via INTRAVENOUS
  Filled 2012-12-02 (×2): qty 2
  Filled 2012-12-02 (×2): qty 1
  Filled 2012-12-02: qty 2
  Filled 2012-12-02 (×3): qty 1
  Filled 2012-12-02: qty 2
  Filled 2012-12-02 (×3): qty 1

## 2012-12-02 MED ORDER — SODIUM CHLORIDE 0.9 % IJ SOLN: 10.0000 mL | INTRAMUSCULAR | Status: DC | PRN

## 2012-12-02 MED ORDER — ALBUTEROL SULFATE (5 MG/ML) 0.5% IN NEBU
2.5000 mg | INHALATION_SOLUTION | Freq: Four times a day (QID) | RESPIRATORY_TRACT | Status: DC
  Administered 2012-12-02 – 2012-12-03 (×5): 2.5 mg via RESPIRATORY_TRACT
  Filled 2012-12-02 (×5): qty 0.5

## 2012-12-02 MED ORDER — MAGNESIUM SULFATE 40 MG/ML IJ SOLN
2.0000 g | Freq: Once | INTRAMUSCULAR | Status: AC
  Administered 2012-12-02: 2 g via INTRAVENOUS
  Filled 2012-12-02: qty 50

## 2012-12-02 MED ORDER — SODIUM CHLORIDE 0.9 % IV BOLUS (SEPSIS)
1000.0000 mL | INTRAVENOUS | Status: DC | PRN
  Administered 2012-12-02 (×2): 1000 mL via INTRAVENOUS

## 2012-12-02 MED ORDER — SODIUM CHLORIDE 0.9 % IJ SOLN
10.0000 mL | Freq: Two times a day (BID) | INTRAMUSCULAR | Status: DC
  Administered 2012-12-02: 40 mL
  Administered 2012-12-03 – 2012-12-07 (×8): 10 mL
  Administered 2012-12-07: 20 mL

## 2012-12-02 NOTE — Addendum Note (Signed)
Addended by: Neomia Dear on: 12/02/2012 06:19 PM   Modules accepted: Orders

## 2012-12-02 NOTE — Progress Notes (Signed)
Patient Discharge Instructions:  After Visit Summary (AVS):   Faxed to:  12/02/12 Psychiatric Admission Assessment Note:   Faxed to:  12/02/12 Faxed/Sent to the Next Level Care provider:  12/02/12 Faxed to [facility] of Care @ [phone]  Jerelene Redden, 12/02/2012, 3:46 PM

## 2012-12-02 NOTE — Progress Notes (Signed)
Name: BRIDNEY GUADARRAMA MRN: [id number] DOB: 1955-08-04    LOS: 2  Referring Provider:   Reason for Referral:  Aspiration pneumonia  PULMONARY / CRITICAL CARE MEDICINE  Brief patient description:  57 y/o female in rehab for EtOH problem + psych history, found to be drowsy, had bloody vomitus, likely aspirated, had fever + tachy + high WBC + hypoxia, AKI, likely severe sepsis due to asp pneumonia. Also had a fall 6/28 with left humeral fracture.  Instruments ET tube 6/28>>> PIV 6/28  Cultures Blood 6/28>>>not sent  Urine 6/28>>>not sent  Sputum 6/28>>> BC x 2 6/30 >> UC 6/30 >> Sputum 6/30 >>  Abx Vanc 6/28>>> Zosyn  6/28>>>  Events Since Admission: Intubated 6/28 EGD 6/29 small mallory weiss tear-no active bleed   Current Status:  EGD 6/29 w/ small mallory weiss tear, not actively bleeding.  PPI cont / octreotide stopped.  preesors restarted after EGD , fluid bolus Weaning pressors this am.  Follows simple commands intermittently on precedex.   Vital Signs: Temp:  [97.6 F (36.4 C)-99.8 F (37.7 C)] 97.6 F (36.4 C) (06/30 0800) Pulse Rate:  [93-126] 93 (06/30 0800) Resp:  [16-24] 18 (06/30 0800) BP: (68-123)/(47-84) 90/57 mmHg (06/30 0800) SpO2:  [92 %-100 %] 100 % (06/30 0800) FiO2 (%):  [40 %] 40 % (06/30 0730) Weight:  [43 kg (94 lb 12.8 oz)] 43 kg (94 lb 12.8 oz) (06/30 0000)  Physical Examination: General:  Intubated, sedated Neuro:  No focal deficit, EOM full and equal, pupils equal but sluggish HEENT:  Atraumatic, trachea midline, ET tube well positioned Neck:  No enlarged LN, trachea midline, no trauma Cardiovascular:  RRR, tachycardic Lungs:  Decreased breath sounds bilaterally, has rhonchi bilaterally, no wheeze Abdomen:  Soft, no guarding, hyperactive bowel sounds. Bloody  aspirate seen from NGT Musculoskeletal:  Thin and weak appearing, no extremity edema Skin:  No new rash  ASSESSMENT AND PLAN  PULMONARY  Recent Labs Lab 11/30/12 1900  11/30/12 2318 12/01/12 0420 12/02/12 0428  PHART 7.303* 7.357 7.341* 7.344*  PCO2ART 41.3 31.4* 30.6* 27.1*  PO2ART 70.2* 91.5 86.4 84.1  HCO3 19.8* 16.9* 16.1* 14.4*  O2SAT 92.0 96.6 96.5 96.4   Ventilator Settings: PRVC 16, 420, 40%, +5  CXR: Diffuse B airspace disease. Slightly diminished aeration   A:  VDRF due to aspiration pneumonia, Metabolic acidosis ARDS P:   - ARDS protocol, FiO2 to 0.40, PEEP to 5 - F/U ABG and CXR in AM. - add pulm hygiene w/ BD   CARDIOVASCULAR  Recent Labs Lab 11/30/12 1806  LATICACIDVEN 1.09   ECG:  No tall peaked t-wave, sinus tachy Lines: peripheral  A: Septic shock -pressor dependent  Cortisol 10 6/29   P:  -wean  Pressor support for MAP goal of >60 -follow Pct /Lactate -SCD  -add stress dose steroids 6/30  RENAL  Recent Labs Lab 11/30/12 1744  12/01/12 0230 12/01/12 1457 12/02/12 0418  NA 130*  --  132* 135 135  K 6.7*  < > 5.4* 3.9 3.8  CL 100  --  104 110 110  CO2 19  --  15* 17* 16*  BUN 40*  --  36* 32* 30*  CREATININE 2.46*  --  2.08* 1.92* 1.88*  CALCIUM 9.0  --  7.9* 7.3* 7.4*  MG  --   --  1.5  --  1.0*  PHOS  --   --  3.4  --  3.6  < > = values in this interval not displayed.  Intake/Output     06/29 0701 - 06/30 0700 06/30 0701 - 07/01 0700   I.V. (mL/kg) 2536.7 (59) 107.2 (2.5)   Other 20    NG/GT 30    IV Piggyback 2350    Total Intake(mL/kg) 4936.7 (114.8) 107.2 (2.5)   Urine (mL/kg/hr) 1850 (1.8) 85 (1)   Emesis/NG output     Stool 1 (0)    Total Output 1851 85   Net +3085.7 +22.2        Stool Occurrence 1 x     Foley:  yes  A:  AKI likely due to sepsis, dehydration (nml Scr baseline-03/2012 )  Hyperkalemia >resolved  Hypomagnesium   P:   - IVF resuscitation, monitor Cr, monitor K./mg  -replace Mg    GASTROINTESTINAL  Recent Labs Lab 11/30/12 1744  AST 31  ALT 8  ALKPHOS 242*  BILITOT 0.2*  PROT 8.7*  ALBUMIN 2.0*   A:  UGI bleed S/p EGD 6/29 -small mallory weiss tear  w/ out active bleeding >off octreotide-cont PPI   P:   - appreciate GI input  -remain off octreotide  -cont PPI IV Twice daily per GI   HEMATOLOGIC  Recent Labs Lab 11/30/12 1744 12/01/12 0230 12/02/12 0418  HGB 7.4* 9.3* 7.9*  HCT 23.6* 28.0* 23.7*  PLT 184 145* 186  INR 1.25  --   --   APTT 40*  --   --    A:  Anemia + leukocytosis, likely from UGI bleed + sepsis/PNA P:  - Monitor Hb, if lower than 7 then will transfuse. - Abx as ordered.  INFECTIOUS  Recent Labs Lab 11/30/12 1744 12/01/12 0230 12/02/12 0418  WBC 20.2* 19.7* 38.7*   Cultures: 6/30 pan cx  Antibiotics: Zosyn + vancomycin  A:  Likely aspiration pneumonia Temp tr down, wbc tr up , cxr decreased aeration  P:   Zosyn + vanco until culture results come in. Pan cx  Add stress dose steroids w/ low cortisol    ENDOCRINE  Recent Labs Lab 12/01/12 1538 12/01/12 1540 12/01/12 2014 12/02/12 0012 12/02/12 0330  GLUCAP 170* 191* 122* 121* 75   A:  Mildly hypoglycemic, probably from sepsis and poor PO intake P:   - D5NS and monitor blood sugar.  NEUROLOGIC  A:  Altered mental status, probably multifactorial: sepsis/PNA, psych history, maybe some component from the tramadol >following simple commands on Precedex   P:   - Sedation as needed.  BEST PRACTICE / DISPOSITION Level of Care:  Critical Care MICU Primary Service:  PCCM Consultants:  GI Code Status:  Full Diet:  NPO for now DVT Px:  SCD GI Px:  Protonix  Skin Integrity:  Routine care Social / Family:  Sister and daughter  40 minutes CC time   Levy Pupa, MD, PhD 12/02/2012, 10:26 AM [facility] [phone] or if no answer [phone]

## 2012-12-02 NOTE — Progress Notes (Signed)
ANTIBIOTIC CONSULT NOTE - FOLLOW UP  Pharmacy Consult for vancomycin, Zosyn Indication: aspiration pneumonia  Allergies  Allergen Reactions  . Amitriptyline Hcl Swelling    In the face.  . Doxycycline Hyclate Itching    Feels like something crawling under her skin    Patient Measurements: Height: 5\' 3"  (160 cm) Weight: 94 lb 12.8 oz (43 kg) IBW/kg (Calculated) : 52.4  Vital Signs: Temp: 97.6 F (36.4 C) (06/30 0800) Temp src: Axillary (06/30 0800) BP: 98/59 mmHg (06/30 0900) Pulse Rate: 92 (06/30 0900) Intake/Output from previous day: 06/29 0701 - 06/30 0700 In: 4936.7 [I.V.:2536.7; NG/GT:30; IV Piggyback:2350] Out: 1851 [Urine:1850; Stool:1] Intake/Output from this shift: Total I/O In: 214.4 [I.V.:214.4] Out: 85 [Urine:85]  Labs:  Recent Labs  11/30/12 1744 12/01/12 0230 12/01/12 1457 12/02/12 0418  WBC 20.2* 19.7*  --  38.7*  HGB 7.4* 9.3*  --  7.9*  PLT 184 145*  --  186  CREATININE 2.46* 2.08* 1.92* 1.88*   Estimated Creatinine Clearance: 23 ml/min (by C-G formula based on Cr of 1.88). No results found for this basename: Rolm Gala, VANCORANDOM, GENTTROUGH, GENTPEAK, GENTRANDOM, TOBRATROUGH, TOBRAPEAK, TOBRARND, AMIKACINPEAK, AMIKACINTROU, AMIKACIN,  in the last 72 hours     Assessment: 76 yof with chronic ETOH abuse, recent admissions to [facility] for detox s/p fall 6/27 with resulting L humeral neck fracture presented 6/28 with AMS. Pt was febrile, hypoxic, CXR with multifocal opacities suspicious for aspiration PNA. Bloody vomitus and lavage from NGT with bloody materials. Vanc and Zosyn started for aspiration PNA  Today is D#3 Vancomycin, Zosyn. Tmax 100.2, WBC up to 38.7K (?reason), AKI with Scr improved to 1.88 for CG CrCl of 23 m/min and normalized CrCl of 38 ml/min. Anticipated further improvement in Scr - good UOP  Blood cultures and sputum cx pending. CXR 6/30 with worsening of airspace dz.   Goal of Therapy:  Vancomycin trough level  15-20 mcg/ml Appropriate renal adjustments of Zosyn  Plan:   Change Zosyn to 3.375 gm IV q8h extended dosing interval given over 4 hours   Change Vancomycin to 750 mg IV q24h  Pharmacy will f/u  Geoffry Paradise, PharmD, BCPS Pager: [phone] 9:12 AM Pharmacy #: [id number]

## 2012-12-02 NOTE — Procedures (Signed)
Central Venous Catheter Insertion Procedure Note TIFFANNIE SLOSS [id number] 07/28/1955  Procedure: Insertion of Central Venous Catheter Indications: Assessment of intravascular volume and Drug and/or fluid administration  Procedure Details Consent: Unable to obtain consent because of emergent medical necessity. Time Out: Verified patient identification, verified procedure, site/side was marked, verified correct patient position, special equipment/implants available, medications/allergies/relevent history reviewed, required imaging and test results available.  Performed  Maximum sterile technique was used including antiseptics, cap, gloves, gown, hand hygiene, mask and sheet. Skin prep: Chlorhexidine; local anesthetic administered A antimicrobial bonded/coated triple lumen catheter was placed in the left internal jugular vein using the Seldinger technique.  Evaluation Blood flow good Complications: No apparent complications Patient did tolerate procedure well. Chest X-ray ordered to verify placement.  CXR: pending.  BYRUM,ROBERT S. 12/02/2012, 1:00 PM

## 2012-12-03 ENCOUNTER — Ambulatory Visit: Payer: Self-pay | Admitting: Internal Medicine

## 2012-12-03 ENCOUNTER — Inpatient Hospital Stay (HOSPITAL_COMMUNITY): Payer: PRIVATE HEALTH INSURANCE

## 2012-12-03 LAB — BLOOD GAS, ARTERIAL
Acid-base deficit: 9.8 mmol/L — ABNORMAL HIGH (ref 0.0–2.0)
FIO2: 0.4 %
MECHVT: 320 mL
O2 Saturation: 95.9 %
RATE: 16 resp/min
TCO2: 14.6 mmol/L (ref 0–100)
pCO2 arterial: 34.5 mmHg — ABNORMAL LOW (ref 35.0–45.0)
pO2, Arterial: 89.5 mmHg (ref 80.0–100.0)

## 2012-12-03 LAB — GLUCOSE, CAPILLARY
Glucose-Capillary: 172 mg/dL — ABNORMAL HIGH (ref 70–99)
Glucose-Capillary: 182 mg/dL — ABNORMAL HIGH (ref 70–99)
Glucose-Capillary: 107 mg/dL — ABNORMAL HIGH (ref 70–99)

## 2012-12-03 LAB — URINE CULTURE: Colony Count: NO GROWTH

## 2012-12-03 LAB — BASIC METABOLIC PANEL
BUN: 28 mg/dL — ABNORMAL HIGH (ref 6–23)
BUN: 28 mg/dL — ABNORMAL HIGH (ref 6–23)
CO2: 20 mEq/L (ref 19–32)
Chloride: 111 mEq/L (ref 96–112)
Chloride: 112 mEq/L (ref 96–112)
Creatinine, Ser: 1.87 mg/dL — ABNORMAL HIGH (ref 0.50–1.10)
GFR calc non Af Amer: 30 mL/min — ABNORMAL LOW (ref >90)
Glucose, Bld: 176 mg/dL — ABNORMAL HIGH (ref 70–99)
Glucose, Bld: 126 mg/dL — ABNORMAL HIGH (ref 70–99)
Potassium: 3.1 mEq/L — ABNORMAL LOW (ref 3.5–5.1)
Potassium: 3 mEq/L — ABNORMAL LOW (ref 3.5–5.1)
Sodium: 140 mEq/L (ref 135–145)

## 2012-12-03 LAB — CBC
HCT: 22.1 % — ABNORMAL LOW (ref 36.0–46.0)
Hemoglobin: 7.4 g/dL — ABNORMAL LOW (ref 12.0–15.0)
MCHC: 33.5 g/dL (ref 30.0–36.0)
RBC: 2.23 MIL/uL — ABNORMAL LOW (ref 3.87–5.11)
WBC: 33.9 10*3/uL — ABNORMAL HIGH (ref 4.0–10.5)

## 2012-12-03 LAB — PHOSPHORUS: Phosphorus: 4.9 mg/dL — ABNORMAL HIGH (ref 2.3–4.6)

## 2012-12-03 MED ORDER — POTASSIUM CHLORIDE 20 MEQ/15ML (10%) PO LIQD
40.0000 meq | ORAL | Status: AC
  Administered 2012-12-03 (×2): 40 meq via ORAL
  Filled 2012-12-03 (×2): qty 30

## 2012-12-03 MED ORDER — FUROSEMIDE 10 MG/ML IJ SOLN
40.0000 mg | Freq: Once | INTRAMUSCULAR | Status: AC
  Administered 2012-12-03: 40 mg via INTRAVENOUS
  Filled 2012-12-03: qty 4

## 2012-12-03 MED ORDER — MAGNESIUM SULFATE 40 MG/ML IJ SOLN
2.0000 g | Freq: Once | INTRAMUSCULAR | Status: AC
  Administered 2012-12-03: 2 g via INTRAVENOUS
  Filled 2012-12-03: qty 50

## 2012-12-03 MED ORDER — POTASSIUM CHLORIDE 20 MEQ/15ML (10%) PO LIQD
40.0000 meq | Freq: Two times a day (BID) | ORAL | Status: DC
  Administered 2012-12-03: 40 meq via ORAL
  Filled 2012-12-03: qty 30

## 2012-12-03 NOTE — Progress Notes (Signed)
[facility] ADULT ICU REPLACEMENT PROTOCOL FOR AM LAB REPLACEMENT ONLY  The patient does not apply for the [facility] Adult ICU Electrolyte Replacment Protocol based on the criteria listed below:   1. Is GFR >/= 40 ml/min? no  Patient's GFR today is 34 2. Is urine output >/= 0.5 ml/kg/hr for the last 6 hours? no Patient's UOP is  ml/kg/hr 3. Is BUN < 60 mg/dL? yes  Patient's BUN today is 28 4. Abnormal electrolyte(s): K 3.1, Mg 1.5 5. Ordered repletion with: NA 6. If a panic level lab has been reported, has the CCM MD in charge been notified? yes.   Physician:  Dr Pedro Earls, Malin Cervini A 12/03/2012 6:53 AM

## 2012-12-03 NOTE — Progress Notes (Signed)
eLink Physician-Brief Progress Note Patient Name: [name] [name] DOB: [birth year] MRN: [id number]  Date of Service  12/03/2012   HPI/Events of Note  Temp of 33.6   eICU Interventions  Bair hugger applied to facilitate increase in core temp   Intervention Category Minor Interventions: Routine modifications to care plan (e.g. PRN medications for pain, fever)  DETERDING,ELIZABETH 12/03/2012, 1:46 AM

## 2012-12-03 NOTE — Progress Notes (Signed)
Name: [name] [name] MRN: [id number] DOB: 1956/02/18    LOS: 3  Referring Provider:   Reason for Referral:  Aspiration pneumonia  PULMONARY / CRITICAL CARE MEDICINE  Brief patient description:  57 y/o female in rehab for EtOH problem + psych history, found to be drowsy, had bloody vomitus, likely aspirated, had fever + tachy + high WBC + hypoxia, AKI, likely severe sepsis due to asp pneumonia. Also had a fall 6/28 with left humeral fracture.  Instruments ET tube 6/28>>> L IJ TLC 6/30>>> PIV 6/28  Cultures Blood 6/28>>>not sent  Urine 6/28>>>not sent  Sputum 6/28>>> Few staph aureus BC x 2 6/30 >> UC 6/30 >> Sputum 6/30 >>  Abx Vanc 6/28>>> Zosyn  6/28>>>  Events Since Admission: Intubated 6/28 EGD 6/29 small mallory weiss tear-no active bleed   Current Status:  Intubated, sedation on hold Arousable  Vital Signs: Temp:  [92.5 F (33.6 C)-99.7 F (37.6 C)] 99.5 F (37.5 C) (07/01 0800) Pulse Rate:  [59-116] 86 (07/01 0836) Resp:  [14-30] 18 (07/01 0836) BP: (70-143)/(46-87) 124/73 mmHg (07/01 0800) SpO2:  [89 %-100 %] 100 % (07/01 0836) FiO2 (%):  [40 %] 40 % (07/01 0836) Weight:  [50.1 kg (110 lb 7.2 oz)] 50.1 kg (110 lb 7.2 oz) (07/01 0359)  Physical Examination: General:  Intubated, sedated Neuro:  No focal deficit, EOM full and equal, pupils equal but sluggish HEENT:  Atraumatic, trachea midline, ET tube well positioned Neck:  No enlarged LN, trachea midline, no trauma Cardiovascular:  RRR, tachycardic Lungs:  Decreased breath sounds bilaterally, has rhonchi bilaterally, no wheeze Abdomen:  Soft, no guarding, hyperactive bowel sounds. Bloody  aspirate seen from NGT Musculoskeletal:  Thin and weak appearing, no extremity edema Skin:  No new rash  ASSESSMENT AND PLAN  PULMONARY  Recent Labs Lab 11/30/12 2318 12/01/12 0420 12/02/12 0428 12/02/12 1418 12/03/12 0500  PHART 7.357 7.341* 7.344* 7.263* 7.280*  PCO2ART 31.4* 30.6* 27.1* 33.1* 34.5*   PO2ART 91.5 86.4 84.1 81.7 89.5  HCO3 16.9* 16.1* 14.4* 14.5* 15.7*  O2SAT 96.6 96.5 96.4 94.8 95.9   Ventilator Settings: PS 8/5 40%  CXR: Diffuse B airspace disease. Worsening infiltrate, consistent with edema  A:  VDRF due to aspiration pneumonia, Metabolic acidosis ARDS Pulmonary Edema P:   - ARDS protocol, FiO2 to 0.40, PEEP to 5 - F/U ABG and CXR in AM. - BD prn - Lasix 7/1 with goal of at least 500 net negative  CARDIOVASCULAR  Recent Labs Lab 11/30/12 1806 12/02/12 1325  LATICACIDVEN 1.09 0.9   ECG:  No tall peaked t-wave, sinus tachy Lines: peripheral  A: Septic shock -now resolved Cortisol 10 6/29   P:  - Off pressors,? With addition with hydrocort - Continue stress dose steroids - Lasix as BP permits  RENAL  Recent Labs Lab 11/30/12 1744  12/01/12 0230 12/01/12 1457 12/02/12 0418 12/03/12 0530  NA 130*  --  132* 135 135 140  K 6.7*  < > 5.4* 3.9 3.8 3.1*  CL 100  --  104 110 110 111  CO2 19  --  15* 17* 16* 18*  BUN 40*  --  36* 32* 30* 28*  CREATININE 2.46*  --  2.08* 1.92* 1.88* 1.85*  CALCIUM 9.0  --  7.9* 7.3* 7.4* 7.6*  MG  --   --  1.5  --  1.0* 1.5  PHOS  --   --  3.4  --  3.6 4.9*  < > = values in this interval  not displayed. Intake/Output     06/30 0701 - 07/01 0700 07/01 0701 - 07/02 0700   I.V. (mL/kg) 1788.2 (35.7)    Other     NG/GT     IV Piggyback 3312.5    Total Intake(mL/kg) 5100.7 (101.8)    Urine (mL/kg/hr) 1490 (1.2)    Emesis/NG output 50 (0)    Stool     Total Output 1540     Net +3560.7           Foley:  yes  A:  AKI likely due to sepsis, dehydration (nml Scr baseline-03/2012 )  Hyperkalemia >resolved  Hypomagnesium  Hypokalemia P:   - Replace potassium - Replace magnesium - Stop sodium bicarb infusion 7/1   GASTROINTESTINAL  Recent Labs Lab 11/30/12 1744  AST 31  ALT 8  ALKPHOS 242*  BILITOT 0.2*  PROT 8.7*  ALBUMIN 2.0*   A:  UGI bleed S/p EGD 6/29 -small mallory weiss tear w/ out  active bleeding >off octreotide-cont PPI   P:   - appreciate GI input  - continue PPI per GI - Stop LIWS  HEMATOLOGIC  Recent Labs Lab 11/30/12 1744 12/01/12 0230 12/02/12 0418 12/03/12 0530  HGB 7.4* 9.3* 7.9* 7.4*  HCT 23.6* 28.0* 23.7* 22.1*  PLT 184 145* 186 213  INR 1.25  --   --   --   APTT 40*  --   --   --    A:  Anemia + leukocytosis, likely from UGI bleed + sepsis/PNA P:  - Monitor Hb, if lower than 7 then will transfuse. - Abx as ordered.  INFECTIOUS  Recent Labs Lab 11/30/12 1744 12/01/12 0230 12/02/12 0418 12/02/12 1325 12/03/12 0530  WBC 20.2* 19.7* 38.7*  --  33.9*  PROCALCITON  --   --   --  3.25  --    Cultures: 6/30 pan cx  Antibiotics: Zosyn + vancomycin  A:  Likely aspiration pneumonia Temp tr down, wbc tr up , cxr decreased aeration  P:   Zosyn + vanco until culture results come in.  ENDOCRINE  Recent Labs Lab 12/02/12 1552 12/02/12 1939 12/03/12 0006 12/03/12 0301 12/03/12 0731  GLUCAP 149* 200* 172* 182* 128*   A:  Mildly hypoglycemic, probably from sepsis and poor PO intake P:   - D5NS and monitor blood sugar.  NEUROLOGIC  A:  Altered mental status, probably multifactorial: sepsis/PNA, psych history, maybe some component from the tramadol >following simple commands on Precedex   P:   - Sedation as needed.  BEST PRACTICE / DISPOSITION Level of Care:  Critical Care MICU Primary Service:  PCCM Consultants:  GI Code Status:  Full Diet:  NPO for now DVT Px:  SCD GI Px:  Protonix  Skin Integrity:  Routine care Social / Family:  Sister and daughter  Today's Summary: Mrs. Pinkley continues to improve following her UGIB. Plan to continue vent support today due to worsening chest x-ray. Will diurese today as her BP permits. Will hold off on enteral nutrition today, but likely start tomorrow if unable to extubate.   Sharlet Salina Hocutt S-ACNP  40 min CC time  Levy Pupa, MD, PhD 12/03/2012, 10:53 AM  Pulmonary  and Critical Care [phone] or if no answer [phone]

## 2012-12-03 NOTE — Progress Notes (Signed)
eLink Physician-Brief Progress Note Patient Name: [name] [name] DOB: [birth year] MRN: [id number]  Date of Service  12/03/2012   HPI/Events of Note   hypokalemia  eICU Interventions  replaced   Intervention Category Minor Interventions: Electrolytes abnormality - evaluation and management  Davarius Ridener S. 12/03/2012, 5:26 PM

## 2012-12-03 NOTE — Progress Notes (Signed)
Spiritual care has been following, but at times of visits no family or friends were present and patient was not able to respond.

## 2012-12-03 NOTE — Progress Notes (Signed)
Follow up. Family arrived. Offered support. They requested prayer. Presence; prayer; support.

## 2012-12-04 ENCOUNTER — Inpatient Hospital Stay (HOSPITAL_COMMUNITY): Payer: PRIVATE HEALTH INSURANCE

## 2012-12-04 DIAGNOSIS — K922 Gastrointestinal hemorrhage, unspecified: Secondary | ICD-10-CM

## 2012-12-04 LAB — MAGNESIUM: Magnesium: 2.1 mg/dL (ref 1.5–2.5)

## 2012-12-04 LAB — GLUCOSE, CAPILLARY
Glucose-Capillary: 118 mg/dL — ABNORMAL HIGH (ref 70–99)
Glucose-Capillary: 134 mg/dL — ABNORMAL HIGH (ref 70–99)
Glucose-Capillary: 125 mg/dL — ABNORMAL HIGH (ref 70–99)

## 2012-12-04 LAB — PROTEIN, BODY FLUID: Total protein, fluid: 1.8 g/dL

## 2012-12-04 LAB — CBC
HCT: 21.2 % — ABNORMAL LOW (ref 36.0–46.0)
Hemoglobin: 7.2 g/dL — ABNORMAL LOW (ref 12.0–15.0)
MCH: 32.9 pg (ref 26.0–34.0)
RBC: 2.19 MIL/uL — ABNORMAL LOW (ref 3.87–5.11)

## 2012-12-04 LAB — BASIC METABOLIC PANEL
CO2: 20 mEq/L (ref 19–32)
Chloride: 112 mEq/L (ref 96–112)
Creatinine, Ser: 2.02 mg/dL — ABNORMAL HIGH (ref 0.50–1.10)
GFR calc Af Amer: 31 mL/min — ABNORMAL LOW (ref >90)
Potassium: 4 mEq/L (ref 3.5–5.1)
Sodium: 142 mEq/L (ref 135–145)

## 2012-12-04 LAB — BLOOD GAS, ARTERIAL
Bicarbonate: 18.1 mEq/L — ABNORMAL LOW (ref 20.0–24.0)
FIO2: 40 %
O2 Saturation: 95.5 %
PEEP: 5 cmH2O
TCO2: 17.6 mmol/L (ref 0–100)
pO2, Arterial: 83.1 mmHg (ref 80.0–100.0)

## 2012-12-04 LAB — BODY FLUID CELL COUNT WITH DIFFERENTIAL: Neutrophil Count, Fluid: 93 % — ABNORMAL HIGH (ref 0–25)

## 2012-12-04 LAB — TYPE AND SCREEN: Antibody Screen: NEGATIVE

## 2012-12-04 LAB — LACTATE DEHYDROGENASE: LDH: 156 U/L (ref 94–250)

## 2012-12-04 LAB — LACTATE DEHYDROGENASE, PLEURAL OR PERITONEAL FLUID: LD, Fluid: 73 U/L — ABNORMAL HIGH (ref 3–23)

## 2012-12-04 LAB — CULTURE, RESPIRATORY W GRAM STAIN

## 2012-12-04 LAB — CHOLESTEROL, TOTAL: Cholesterol: 99 mg/dL (ref 0–200)

## 2012-12-04 LAB — PHOSPHORUS: Phosphorus: 5.6 mg/dL — ABNORMAL HIGH (ref 2.3–4.6)

## 2012-12-04 MED ORDER — LEVOFLOXACIN IN D5W 500 MG/100ML IV SOLN
500.0000 mg | INTRAVENOUS | Status: DC
  Administered 2012-12-06 – 2012-12-08 (×2): 500 mg via INTRAVENOUS
  Filled 2012-12-04 (×2): qty 100

## 2012-12-04 MED ORDER — FUROSEMIDE 10 MG/ML IJ SOLN
40.0000 mg | Freq: Once | INTRAMUSCULAR | Status: AC
  Administered 2012-12-04: 40 mg via INTRAVENOUS
  Filled 2012-12-04: qty 4

## 2012-12-04 MED ORDER — OXEPA PO LIQD
1000.0000 mL | ORAL | Status: DC
  Administered 2012-12-04: 1000 mL
  Filled 2012-12-04 (×3): qty 1000

## 2012-12-04 MED ORDER — PRO-STAT SUGAR FREE PO LIQD
30.0000 mL | Freq: Every day | ORAL | Status: DC
  Administered 2012-12-04: 30 mL
  Filled 2012-12-04 (×3): qty 30

## 2012-12-04 MED ORDER — LEVOFLOXACIN IN D5W 750 MG/150ML IV SOLN
750.0000 mg | INTRAVENOUS | Status: AC
  Administered 2012-12-04: 750 mg via INTRAVENOUS
  Filled 2012-12-04: qty 150

## 2012-12-04 MED ORDER — MIDAZOLAM HCL 2 MG/2ML IJ SOLN
3.0000 mg | Freq: Once | INTRAMUSCULAR | Status: AC
  Administered 2012-12-04: 3 mg via INTRAVENOUS
  Filled 2012-12-04: qty 4

## 2012-12-04 MED ORDER — LEVOFLOXACIN 500 MG PO TABS: 500.0000 mg | ORAL_TABLET | ORAL | Status: DC

## 2012-12-04 MED ORDER — HYDROCORTISONE SOD SUCCINATE 100 MG IJ SOLR
50.0000 mg | Freq: Two times a day (BID) | INTRAMUSCULAR | Status: DC
  Administered 2012-12-04 – 2012-12-06 (×4): 50 mg via INTRAVENOUS
  Filled 2012-12-04 (×4): qty 1

## 2012-12-04 NOTE — Progress Notes (Signed)
Name: [name] [name] MRN: [id number] DOB: [birth year]    LOS: 4  Referring Provider:   Reason for Referral:  Aspiration pneumonia  PULMONARY / CRITICAL CARE MEDICINE  Brief patient description:  57 y/o female in rehab for EtOH problem + psych history, found to be drowsy, had bloody vomitus, likely aspirated, had fever + tachy + high WBC + hypoxia, AKI, likely severe sepsis due to asp pneumonia. Also had a fall 6/28 with left humeral fracture.  Instruments ET tube 6/28>>> L IJ TLC 6/30>>> PIV 6/28  Cultures Blood 6/28>>>not sent  Urine 6/28>>>not sent  Sputum 6/28>>> Few staph aureus (MSSA) BC x 2 6/30 >> UC 6/30 >> Sputum 6/30 >>  Abx Vanc 6/28>>> 7/2 Zosyn  6/28>>> 7/2 Levaquin 7/2 (MSSA PNA) >>   Events Since Admission: Intubated 6/28 EGD 6/29 small mallory weiss tear-no active bleed   Current Status:  Intubated on precedex Arousable  Vital Signs: Temp:  [93.7 F (34.3 C)-98.4 F (36.9 C)] 98.4 F (36.9 C) (07/02 0600) Pulse Rate:  [61-93] 69 (07/02 0600) Resp:  [14-24] 15 (07/02 0600) BP: (111-150)/(71-95) 145/88 mmHg (07/02 0600) SpO2:  [97 %-100 %] 100 % (07/02 0600) FiO2 (%):  [40 %] 40 % (07/02 0500)  Physical Examination: General:  Intubated, sedated Neuro:  No focal deficit, EOM full and equal, pupils equal but sluggish HEENT:  Atraumatic, trachea midline, ET tube well positioned Neck:  No enlarged LN, trachea midline, no trauma Cardiovascular:  RRR, tachycardic Lungs:  Decreased breath sounds bilaterally Abdomen:  Soft, no guarding, active bowel sounds.  Musculoskeletal:  Thin and weak appearing, no extremity edema Skin:  No new rash  ASSESSMENT AND PLAN  PULMONARY  Recent Labs Lab 12/01/12 0420 12/02/12 0428 12/02/12 1418 12/03/12 0500 12/04/12 0455  PHART 7.341* 7.344* 7.263* 7.280* 7.333*  PCO2ART 30.6* 27.1* 33.1* 34.5* 35.1  PO2ART 86.4 84.1 81.7 89.5 83.1  HCO3 16.1* 14.4* 14.5* 15.7* 18.1*  O2SAT 96.5 96.4 94.8 95.9 95.5    Ventilator Settings: PS 5/5 40%  CXR: Diffuse B airspace disease. Bilateral effusion right greater than left.  A:  VDRF due to aspiration pneumonia, Metabolic acidosis ARDS Pulmonary Edema P:   - ARDS protocol, FiO2 to 0.40, PEEP to 5 - start SBT's - F/U ABG and CXR in AM. - BD prn - Lasix 7/2 with goal of at least 500 net negative - Evaluate right chest for potential thoracentesis   CARDIOVASCULAR  Recent Labs Lab 11/30/12 1806 12/02/12 1325  LATICACIDVEN 1.09 0.9   ECG:  No tall peaked t-wave, sinus tachy Lines: peripheral  A: Septic shock -now resolved Cortisol 10 6/29   P:  - Continues to remain off pressors - Wean stress dose steroids to q12 H 7/2 - Lasix as BP permits  RENAL  Recent Labs Lab 12/01/12 0230 12/01/12 1457 12/02/12 0418 12/03/12 0530 12/03/12 1525 12/04/12 0500  NA 132* 135 135 140 143 142  K 5.4* 3.9 3.8 3.1* 3.0* 4.0  CL 104 110 110 111 112 112  CO2 15* 17* 16* 18* 20 20  BUN 36* 32* 30* 28* 28* 29*  CREATININE 2.08* 1.92* 1.88* 1.85* 1.87* 2.02*  CALCIUM 7.9* 7.3* 7.4* 7.6* 7.6* 7.6*  MG 1.5  --  1.0* 1.5  --  2.1  PHOS 3.4  --  3.6 4.9*  --  5.6*   Intake/Output     07/01 0701 - 07/02 0700 07/02 0701 - 07/03 0700   I.V. (mL/kg) 432.9 (8.6)    NG/GT 35  IV Piggyback 275    Total Intake(mL/kg) 742.9 (14.8)    Urine (mL/kg/hr) 1760 (1.5)    Emesis/NG output     Total Output 1760     Net -1017.1           Foley:  yes  A:   AKI likely due to sepsis, dehydration (nml Scr baseline-03/2012 )  Hyperkalemia >resolved  Hypomagnesium  Hypokalemia P:   - Follow BMP - Strict I&O - Lasix as above   GASTROINTESTINAL  Recent Labs Lab 11/30/12 1744  AST 31  ALT 8  ALKPHOS 242*  BILITOT 0.2*  PROT 8.7*  ALBUMIN 2.0*   A:   UGI bleed S/p EGD 6/29 -small mallory weiss tear w/ out active bleeding >off octreotide-cont PPI   P:   - appreciate GI input  - continue PPI per GI - Will likely need to start enteral  nutrition per RD recs  HEMATOLOGIC  Recent Labs Lab 11/30/12 1744 12/01/12 0230 12/02/12 0418 12/03/12 0530 12/04/12 0500  HGB 7.4* 9.3* 7.9* 7.4* 7.2*  HCT 23.6* 28.0* 23.7* 22.1* 21.2*  PLT 184 145* 186 213 203  INR 1.25  --   --   --   --   APTT 40*  --   --   --   --    A:  Anemia + leukocytosis, likely from UGI bleed + sepsis/PNA P:  - Monitor Hb, if lower than 7 then will transfuse. - Abx as ordered.  INFECTIOUS  Recent Labs Lab 11/30/12 1744 12/01/12 0230 12/02/12 0418 12/02/12 1325 12/03/12 0530 12/04/12 0500  WBC 20.2* 19.7* 38.7*  --  33.9* 22.1*  PROCALCITON  --   --   --  3.25  --   --    Cultures: 6/30 pan cx  Antibiotics: Zosyn + vancomycin  A:  Likely aspiration pneumonia: SA in sputum (MSSA) Temp tr down, wbc tr up , cxr decreased aeration  P:   Change to levaquin on 7/2, complete 10d total   ENDOCRINE  Recent Labs Lab 12/03/12 1613 12/03/12 1959 12/03/12 2349 12/04/12 0457 12/04/12 0757  GLUCAP 107* 135* 89 118* 125*   A:  Mildly hypoglycemic, probably from sepsis and poor PO intake P:   - D5NS and monitor blood sugar.  NEUROLOGIC  A:  Altered mental status, probably multifactorial: sepsis/PNA, psych history, maybe some component from the tramadol, hx EtOH >following simple commands on Precedex   P:   - Sedation as needed.  BEST PRACTICE / DISPOSITION Level of Care:  Critical Care MICU Primary Service:  PCCM Consultants:  GI Code Status:  Full Diet:  NPO for now DVT Px:  SCD GI Px:  Protonix  Skin Integrity:  Routine care Social / Family:  Sister and daughter  Today's Summary: [name] continues to improve following her UGIB. Plan to continue vent support today as her chest x-ray still shows significant airspace disease. Will diurese again today with a goal of net negative 500 mL. Will evaluate right chest effusion for potential thoracentesis. Will have dietary see patient for enteral feeding recommendations.   45  minutes CC time  Levy Pupa, MD, PhD 12/04/2012, 11:34 AM [facility] [phone] or if no answer [phone]

## 2012-12-04 NOTE — Progress Notes (Signed)
[id number] Earlene Plater, RN, BSN, CCM:  CHART REVIEWED AND UPDATED.  Iv sedation and full vent support.Next chart review due on [id number]. NO DISCHARGE NEEDS PRESENT AT THIS TIME. CASE MANAGEMENT [phone]

## 2012-12-04 NOTE — Progress Notes (Signed)
Chaplain provided brief support with pt's sister, daughter, grand daughter and ex-husband in lobby outside of ICU.  Familiar with pt from recent admission to [facility].  Spiritual care will continue to follow for support.

## 2012-12-04 NOTE — Clinical Documentation Improvement (Signed)
THIS DOCUMENT IS NOT A PERMANENT PART OF THE MEDICAL RECORD  Please update your documentation with the medical record to reflect your response to this query. If you need help knowing how to do this please call [phone].  12/04/12  Dear Dr. Marin Shutter Marton [place],  In a better effort to capture your patient's severity of illness, reflect appropriate length of stay and utilization of resources, a review of the patient medical record has revealed the following indicators.   Based on your clinical judgment, please clarify and document in a progress note and/or discharge summary the clinical condition associated with the following supporting information: In responding to this query please exercise your independent judgment.  The fact that a query is asked, does not imply that any particular answer is desired or expected.  Please clarify respiratory status. Thank you.  Possible Clinical Conditions?  Acute Respiratory Failure Acute on Chronic Respiratory Failure Chronic Respiratory Failure Other Condition________________ Cannot Clinically Determine    Supporting Information: Risk Factors: Currently diagnosed with sepsis, aspiration pneumonia, and GI bleed Currently intubated receiving mechanical ventilation History of smoking Respiratory insufficiency  Signs&Symptoms: ED Provider: Pulmonary/Chest: Tachypnea noted. No respiratory distress. She has no wheezes. She has rhonchi. She has no rales.  Diffuse rhonchi  ER Provider: satting 82% on room air and was placed on 2 L nasal cannula with improvement in her O2 sats   Diagnostics: 6/28 in ER  BLOOD GAS, ARTERIAL - Abnormal; Notable for the following:  pH, Arterial  7.303 (*)  pO2, Arterial  70.2 (*)  Bicarbonate  19.8 (*)  Acid-base deficit  5.5 (*)      Radiology: Chest x-ray shows a multifocal pneumonia   Treatment: Procedure: Intubation  Indications: Airway protection and maintenance, respiratory insufficiency IV  antibiotics: Zosyn & Vancomyocin                   You may use possible, probable, or suspect with inpatient documentation. possible, probable, suspected diagnoses MUST be documented at the time of discharge  Reviewed:  no additional documentation provided  Thank You,  Harless Litten RN Clinical Documentation Specialist: [phone] Health Information Management [place]

## 2012-12-04 NOTE — Progress Notes (Addendum)
NUTRITION FOLLOW UP  Intervention:   - Initiate TF of Oxepa via OGT start at 33ml/hr increase by 10ml every 4 hours to goal of 65ml/hr plus Prostat 30ml once daily which will provide 1180 calories, 60g protein meeting 100% of estimated calorie and protein needs - Initiate adult enteral protocol - Will continue to monitor   Nutrition Dx:   Inadequate oral intake related to inability to eat as evidenced by npo status - ongoing  Goal:   Meet >90% estimated needs with enteral nutrition - not yet met, plans to start TF today  Monitor:   Weights, labs, TF initiation/tolerance, vent status  Assessment:   S/p intubation 6/28 EGD 6/29 small mallory weiss tear - no active bleed On ARDS protocol.  Received consult for TF initiation  Potassium low yesterday, now WNL BUN/Cr elevated but trending down Phosphorus elevated Alk phos elevated but trending down   Patient is currently intubated on ventilator support.  MV: 6.4 L/min Temp:Temp (24hrs), Avg:96.3 F (35.7 C), Min:93.7 F (34.3 C), Max:98.4 F (36.9 C)    Height: Ht Readings from Last 1 Encounters:  11/30/12 5\' 3"  (1.6 m)    Weight Status:   Wt Readings from Last 1 Encounters:  12/03/12 110 lb 7.2 oz (50.1 kg)    Re-estimated needs:  Kcal: 1171 Protein: 60-75g Fluid: 1.2 L/day  Skin: +1 generalized edema  Diet Order:  NPO   Intake/Output Summary (Last 24 hours) at 12/04/12 0945 Last data filed at 12/04/12 0800  Gross per 24 hour  Intake 657.12 ml  Output   1860 ml  Net -1202.88 ml    Last BM: 6/30   Labs:   Recent Labs Lab 12/02/12 0418 12/03/12 0530 12/03/12 1525 12/04/12 0500  NA 135 140 143 142  K 3.8 3.1* 3.0* 4.0  CL 110 111 112 112  CO2 16* 18* 20 20  BUN 30* 28* 28* 29*  CREATININE 1.88* 1.85* 1.87* 2.02*  CALCIUM 7.4* 7.6* 7.6* 7.6*  MG 1.0* 1.5  --  2.1  PHOS 3.6 4.9*  --  5.6*  GLUCOSE 85 176* 126* 130*    CBG (last 3)   Recent Labs  12/03/12 2349 12/04/12 0457  12/04/12 0757  GLUCAP 89 118* 125*    Scheduled Meds: . antiseptic oral rinse  15 mL Mouth Rinse QID  . chlorhexidine  15 mL Mouth Rinse BID  . furosemide  40 mg Intravenous Once  . hydrocortisone sodium succinate  50 mg Intravenous Q12H  . insulin aspart  2-6 Units Subcutaneous Q4H  . pantoprazole (PROTONIX) IV  40 mg Intravenous Q12H  . piperacillin-tazobactam (ZOSYN)  IV  3.375 g Intravenous Q8H  . sodium chloride  10-40 mL Intracatheter Q12H  . vancomycin  750 mg Intravenous Q24H    Continuous Infusions: . dexmedetomidine Stopped (12/04/12 0829)  . dextrose 5 % and 0.9% NaCl 10 mL/hr at 12/02/12 1440  . phenylephrine (NEO-SYNEPHRINE) Adult infusion Stopped (12/02/12 1831)     Levon Hedger MS, RD, LDN [phone] Pager [phone] After Hours Pager

## 2012-12-04 NOTE — Clinical Documentation Improvement (Signed)
THIS DOCUMENT IS NOT A PERMANENT PART OF THE MEDICAL RECORD  Please update your documentation with the medical record to reflect your response to this query. If you need help knowing how to do this please call [phone].  12/04/12   Dear Dr. Marin Shutter / Associates,  In a better effort to capture your patient's severity of illness, reflect appropriate length of stay and utilization of resources, a review of the patient medical record has revealed the following indicators.   Based on your clinical judgment, please clarify and document in a progress note and/or discharge summary the clinical condition associated with the following supporting information: In responding to this query please exercise your independent judgment.  The fact that a query is asked, does not imply that any particular answer is desired or expected.  Please clarify nutritional status. Thank you.  Possible Clinical Conditions?  Mild Malnutrition  Moderate Malnutrition Severe Malnutrition   Protein Calorie Malnutrition Severe Protein Calorie Malnutrition Other Condition________________ Cannot clinically determine  Supporting Information: Risk Factors: History of alcoholism, pancreatitis, and smoking Currently diagnosed with sepsis, asperation pneumonia, and GI bleed Per RD assessment: Patient meets criteria for SEVERE MALNUTRITION related to social/environmental causes AEB 31% weight loss in the past year and a decrease of body fat and muscle mass. Currently intubated receiving mechanical ventilation  Signs & Symptoms: Ht 5'3"  Wt 94 lbs  BMI: 16.8  Weight  Loss 31% over past year  Treatment  Daily weights Strict I&O  Nutrition Consult NUTRITION DIAGNOSIS:  Inadequate oral intake related to inability to eat as evidenced by npo status..  Goal:  Meet >90% estimated needs with enteral nutrition.  Monitor:  TF initiation and tolerance, diet initiation, labs, weight  INTERVENTION:  Recommend  initiation of Vital AF 1.2 per OG tube at 15 ml/hr and increase 10 ml every 4 hours to goal of 35 ml/hr to provide: 1008 kcal, 63 gm protein, 680 ml free water.  You may use possible, probable, or suspect with inpatient documentation. possible, probable, suspected diagnoses MUST be documented at the time of discharge  Reviewed:  no additional documentation provided  Thank You,  Harless Litten RN Clinical Documentation Specialist: [phone] Health Information Management [place]

## 2012-12-04 NOTE — Procedures (Signed)
Thoracentesis Procedure Note  Pre-operative Diagnosis: Respiratory Failure  Post-operative Diagnosis: same  Indications: Volume overload, pleural effusion on chest x-ray  Procedure Details  Consent: Informed consent was obtained. Risks of the procedure were discussed including: infection, bleeding, pain, pneumothorax.  Under sterile conditions the patient was positioned. Betadine solution and sterile drapes were utilized.  1% buffered lidocaine was used to anesthetize the pleural space which was identified by ultrasound. Fluid was obtained without any difficulties and minimal blood loss.  A dressing was applied to the wound and wound care instructions were provided.   Findings 750 ml of clear pleural fluid was obtained. A sample was sent to Pathology for cytogenetics, flow, and cell counts, as well as for infection analysis.  Complications:  None; patient tolerated the procedure well.          Condition: stable  Plan A follow up chest x-ray was ordered.  Attending Attestation: I was present and scrubbed for the key portions of the procedure.  [facility] S-ACNP   Levy Pupa, MD, PhD 12/04/2012, 3:25 PM [facility] [phone] or if no answer [phone]

## 2012-12-05 ENCOUNTER — Inpatient Hospital Stay (HOSPITAL_COMMUNITY): Payer: PRIVATE HEALTH INSURANCE

## 2012-12-05 LAB — BLOOD GAS, ARTERIAL
Acid-base deficit: 5.3 mmol/L — ABNORMAL HIGH (ref 0.0–2.0)
Bicarbonate: 19.6 mEq/L — ABNORMAL LOW (ref 20.0–24.0)
FIO2: 0.4 %
TCO2: 19.1 mmol/L (ref 0–100)
pCO2 arterial: 38.8 mmHg (ref 35.0–45.0)
pH, Arterial: 7.325 — ABNORMAL LOW (ref 7.350–7.450)

## 2012-12-05 LAB — GLUCOSE, CAPILLARY
Glucose-Capillary: 113 mg/dL — ABNORMAL HIGH (ref 70–99)
Glucose-Capillary: 153 mg/dL — ABNORMAL HIGH (ref 70–99)
Glucose-Capillary: 93 mg/dL (ref 70–99)

## 2012-12-05 LAB — CBC
MCV: 100 fL (ref 78.0–100.0)
Platelets: 201 10*3/uL (ref 150–400)
RBC: 2.13 MIL/uL — ABNORMAL LOW (ref 3.87–5.11)
RDW: 17.4 % — ABNORMAL HIGH (ref 11.5–15.5)
WBC: 15.7 10*3/uL — ABNORMAL HIGH (ref 4.0–10.5)

## 2012-12-05 LAB — BASIC METABOLIC PANEL
BUN: 42 mg/dL — ABNORMAL HIGH (ref 6–23)
Calcium: 7.3 mg/dL — ABNORMAL LOW (ref 8.4–10.5)
Chloride: 114 mEq/L — ABNORMAL HIGH (ref 96–112)
Creatinine, Ser: 2.33 mg/dL — ABNORMAL HIGH (ref 0.50–1.10)
Creatinine, Ser: 2.32 mg/dL — ABNORMAL HIGH (ref 0.50–1.10)
GFR calc Af Amer: 26 mL/min — ABNORMAL LOW (ref >90)
GFR calc Af Amer: 26 mL/min — ABNORMAL LOW (ref >90)
GFR calc non Af Amer: 23 mL/min — ABNORMAL LOW (ref >90)
Glucose, Bld: 112 mg/dL — ABNORMAL HIGH (ref 70–99)
Sodium: 145 mEq/L (ref 135–145)

## 2012-12-05 LAB — MAGNESIUM: Magnesium: 1.9 mg/dL (ref 1.5–2.5)

## 2012-12-05 MED ORDER — POTASSIUM CHLORIDE 10 MEQ/50ML IV SOLN
10.0000 meq | INTRAVENOUS | Status: AC
  Administered 2012-12-05 (×3): 10 meq via INTRAVENOUS
  Filled 2012-12-05: qty 150

## 2012-12-05 MED ORDER — POTASSIUM CHLORIDE 10 MEQ/50ML IV SOLN
10.0000 meq | INTRAVENOUS | Status: AC
  Administered 2012-12-05 (×2): 10 meq via INTRAVENOUS
  Filled 2012-12-05: qty 100

## 2012-12-05 MED ORDER — OXYCODONE-ACETAMINOPHEN 5-325 MG PO TABS
1.0000 | ORAL_TABLET | Freq: Four times a day (QID) | ORAL | Status: DC | PRN
  Administered 2012-12-05 – 2012-12-10 (×10): 1 via ORAL
  Filled 2012-12-05 (×13): qty 1

## 2012-12-05 MED ORDER — HYDRALAZINE HCL 20 MG/ML IJ SOLN
10.0000 mg | INTRAMUSCULAR | Status: DC | PRN
  Administered 2012-12-05 – 2012-12-06 (×2): 10 mg via INTRAVENOUS
  Filled 2012-12-05 (×3): qty 1

## 2012-12-05 MED ORDER — AMLODIPINE BESYLATE 5 MG PO TABS
5.0000 mg | ORAL_TABLET | Freq: Every day | ORAL | Status: DC
  Administered 2012-12-05 – 2012-12-10 (×6): 5 mg via ORAL
  Filled 2012-12-05 (×6): qty 1

## 2012-12-05 NOTE — Progress Notes (Signed)
Name: [name] [name] MRN: [id number] DOB: January 14, 1956    LOS: 5  Referring Provider:   Reason for Referral:  Aspiration pneumonia  PULMONARY / CRITICAL CARE MEDICINE  Brief patient description:  57 y/o female in rehab for EtOH problem + psych history, found to be drowsy, had bloody vomitus, likely aspirated, had fever + tachy + high WBC + hypoxia, AKI, likely severe sepsis due to asp pneumonia. Also had a fall 6/28 with left humeral fracture.  Instruments ET tube 6/28>>>7/3 L IJ TLC 6/30>>> PIV 6/28  Cultures Blood 6/28>>>not sent  Urine 6/28>>>not sent  Sputum 6/28>>> Few staph aureus (MSSA) BC x 2 6/30 >> UC 6/30 >> Sputum 6/30 >>neg Pleural 7/2 >>  Abx Vanc 6/28>>> 7/2 Zosyn  6/28>>> 7/2 Levaquin 7/2 (MSSA PNA) >>   Events Since Admission: Intubated 6/28 EGD 6/29 small mallory weiss tear-no active bleed  Thoracentesis 7/2  Current Status:  Intubated looks ok on SBT   Vital Signs: Temp:  [95.9 F (35.5 C)-99 F (37.2 C)] 97.3 F (36.3 C) (07/03 0400) Pulse Rate:  [57-83] 57 (07/03 0400) Resp:  [14-25] 16 (07/03 0400) BP: (132-181)/(91-108) 181/108 mmHg (07/03 0400) SpO2:  [100 %] 100 % (07/03 0400) FiO2 (%):  [40 %] 40 % (07/03 0739) Weight:  [50.2 kg (110 lb 10.7 oz)] 50.2 kg (110 lb 10.7 oz) (07/03 0400)  Physical Examination: General:  Intubated, sedated Neuro:  No focal deficit, EOM full and equal, pupils equal but sluggish, follows commands HEENT:  Atraumatic, trachea midline, ET tube well positioned Neck:  No enlarged LN, trachea midline, no trauma Cardiovascular:  RRR, tachycardic Lungs:  Decreased breath sounds bilaterally Abdomen:  Soft, no guarding, active bowel sounds.  Musculoskeletal:  Thin and weak appearing, no extremity edema Skin:  No new rash  ASSESSMENT AND PLAN  PULMONARY  Recent Labs Lab 12/02/12 0428 12/02/12 1418 12/03/12 0500 12/04/12 0455 12/05/12 0419  PHART 7.344* 7.263* 7.280* 7.333* 7.325*  PCO2ART 27.1* 33.1*  34.5* 35.1 38.8  PO2ART 84.1 81.7 89.5 83.1 134.0*  HCO3 14.4* 14.5* 15.7* 18.1* 19.6*  O2SAT 96.4 94.8 95.9 95.5 98.3   Ventilator Settings: PS 5/5 40%  CXR: Diffuse B airspace disease.   A:  VDRF due to bilateral diffuse pulmonary infiltrates. Likely element of aspiration and ALI, but also probably element of pulmonary edema.  Pleural Effusion - transudative by light's criteria following thoracentesis. This in combination w/ bilateral infiltrates in setting of HTN raises question of flash edema.  She has passed SBT.  P:   -extubate after treating HTN -wean FIO2 -manage HTN  -see CV and ID section   CARDIOVASCULAR  Recent Labs Lab 11/30/12 1806 12/02/12 1325  LATICACIDVEN 1.09 0.9   ECG:  No tall peaked t-wave, sinus tachy Lines: peripheral  A:  Septic shock -now resolved Cortisol 10 6/29  P:  - Restart home amlodipine for hypertension - Add PRN Hydralazine (Goal < 160) - Will hold lasix today due to increased creatinine - check ECHO  RENAL  Recent Labs Lab 12/01/12 0230  12/02/12 0418 12/03/12 0530 12/03/12 1525 12/04/12 0500 12/05/12 0454  NA 132*  < > 135 140 143 142 145  K 5.4*  < > 3.8 3.1* 3.0* 4.0 2.9*  CL 104  < > 110 111 112 112 114*  CO2 15*  < > 16* 18* 20 20 22   BUN 36*  < > 30* 28* 28* 29* 38*  CREATININE 2.08*  < > 1.88* 1.85* 1.87* 2.02* 2.33*  CALCIUM  7.9*  < > 7.4* 7.6* 7.6* 7.6* 7.3*  MG 1.5  --  1.0* 1.5  --  2.1 1.9  PHOS 3.4  --  3.6 4.9*  --  5.6*  --   < > = values in this interval not displayed. Intake/Output     07/02 0701 - 07/03 0700 07/03 0701 - 07/04 0700   I.V. (mL/kg) 583.7 (11.6)    NG/GT 558.7    IV Piggyback 200    Total Intake(mL/kg) 1342.4 (26.7)    Urine (mL/kg/hr) 1505 (1.2)    Drains 700 (0.6)    Total Output 2205     Net -862.6           Foley:  yes  A:   AKI likely due to sepsis, dehydration (nml Scr baseline-03/2012 ): also need to back off on diuretic  Hypomagnesium  Hypokalemia P:   - Follow  BMP - Strict I&O    GASTROINTESTINAL  Recent Labs Lab 11/30/12 1744 12/04/12 1230  AST 31  --   ALT 8  --   ALKPHOS 242*  --   BILITOT 0.2*  --   PROT 8.7* 6.2  ALBUMIN 2.0*  --    A:   UGI bleed S/p EGD 6/29 -small mallory weiss tear w/ out active bleeding >off octreotide-cont PPI   P:   - appreciate GI input  - continue PPI per GI - continue enteral feeding  HEMATOLOGIC  Recent Labs Lab 11/30/12 1744 12/01/12 0230 12/02/12 0418 12/03/12 0530 12/04/12 0500 12/05/12 0454  HGB 7.4* 9.3* 7.9* 7.4* 7.2* 7.2*  HCT 23.6* 28.0* 23.7* 22.1* 21.2* 21.3*  PLT 184 145* 186 213 203 201  INR 1.25  --   --   --   --   --   APTT 40*  --   --   --   --   --    A:  Anemia + leukocytosis, likely from UGI bleed + sepsis/PNA P:  - Monitor Hb, if lower than 7 then will transfuse.  INFECTIOUS  Recent Labs Lab 12/01/12 0230 12/02/12 0418 12/02/12 1325 12/03/12 0530 12/04/12 0500 12/05/12 0454  WBC 19.7* 38.7*  --  33.9* 22.1* 15.7*  PROCALCITON  --   --  3.25  --   --   --    Cultures: 6/30 pan cx  Antibiotics: levoflox  A:   Likely aspiration pneumonia: SA in sputum (MSSA) Temp tr down, wbc tr down , cxr decreased aeration  P:   - Continue Levaquin to complete 10d  ENDOCRINE  Recent Labs Lab 12/04/12 1529 12/04/12 2015 12/05/12 0015 12/05/12 0404 12/05/12 0746  GLUCAP 134* 125* 150* 113* 153*   A:  Mildly hypoglycemic, probably from sepsis and poor PO intake P:   - Follow CBG  NEUROLOGIC  A:  Altered mental status, probably multifactorial: sepsis/PNA, psych history, maybe some component from the tramadol, hx EtOH >following simple commands on Precedex   P:   - D/C precedex - D/C fentanyl - Once awake will better assess for extubation  Muscular Skeletal  A:  Left humoral neck fracture  P: Immobilize LUE Ortho consult when medically appropriate.   BEST PRACTICE / DISPOSITION Level of Care:  Critical Care MICU Primary Service:   PCCM Consultants:  GI Code Status:  Full Diet:  NPO for now DVT Px:  SCD GI Px:  Protonix  Skin Integrity:  Routine care Social / Family:  Sister and daughter  Today's Summary: Mrs. Bowell continues to improve following  her UGIB. Will continue to wean vent towards extubation. Will discontinue sedation due to mental status to better assess her readiness for extubation. No lasix today given her increased creatinine.   CC time 45 minutes  Levy Pupa, MD, PhD 12/05/2012, 9:41 AM [facility] [phone] or if no answer [phone]

## 2012-12-05 NOTE — Progress Notes (Signed)
eLink Physician-Brief Progress Note Patient Name: [name] [name] DOB: [birth year] MRN: [id number]  Date of Service  12/05/2012   HPI/Events of Note   C/o shoulder pain  eICU Interventions  Renew percocet Hypokalemia -repleted    Intervention Category Intermediate Interventions: Electrolyte abnormality - evaluation and management  [name] V. 12/05/2012, 4:02 PM

## 2012-12-05 NOTE — Progress Notes (Signed)
Unable to replace K 2.9 d/t GFR 26

## 2012-12-05 NOTE — Progress Notes (Signed)
CRITICAL VALUE ALERT  Critical value received:  Potassium - 2.7  Date of notification:  12/05/12  Time of notification:  15:42  Critical value read back:yes  Nurse who received alert:  Haskell Flirt, RN  MD notified (1st page):  Dr. Vassie Loll  Time of first page:  15:42  MD notified (2nd page):  Time of second page:  Responding MD:  Dr. Vassie Loll (notified via elink RN)  Time MD responded:  15:42

## 2012-12-05 NOTE — Progress Notes (Signed)
*  PRELIMINARY RESULTS* Echocardiogram 2D Echocardiogram has been performed.  Jeryl Columbia 12/05/2012, 11:28 AM

## 2012-12-05 NOTE — Progress Notes (Signed)
eLink Physician-Brief Progress Note Patient Name: [name] [name] DOB: June 17, 1955 MRN: [id number]  Date of Service  12/05/2012   HPI/Events of Note   K low  eICU Interventions  K supp   Intervention Category Major Interventions: Electrolyte abnormality - evaluation and management  Shan Levans 12/05/2012, 6:11 AM

## 2012-12-05 NOTE — Progress Notes (Signed)
Pt had 80 carbs for lunch - 11.4 bolus given via insulin pump

## 2012-12-05 NOTE — Procedures (Signed)
Extubation Procedure Note  Patient Details:   Name: [name] [name] DOB: 24-Dec-1955 MRN: [id number]   Airway Documentation:     Evaluation  O2 sats: 99 Complications: none Patient tolerated procedure well. Bilateral Breath Sounds: Diminished Suctioning: Airway Pt able to speak  Revonda Humphrey 12/05/2012, 10:42 AM  Pt extubated per MD order.  Tolerated well and was stable throughout the procedure.  Placed on 4L nasal cannula.  Pt has strong cough and is able to clear secretions fairly well.

## 2012-12-06 ENCOUNTER — Inpatient Hospital Stay (HOSPITAL_COMMUNITY): Payer: PRIVATE HEALTH INSURANCE

## 2012-12-06 LAB — GLUCOSE, CAPILLARY
Glucose-Capillary: 129 mg/dL — ABNORMAL HIGH (ref 70–99)
Glucose-Capillary: 75 mg/dL (ref 70–99)
Glucose-Capillary: 85 mg/dL (ref 70–99)
Glucose-Capillary: 107 mg/dL — ABNORMAL HIGH (ref 70–99)
Glucose-Capillary: 87 mg/dL (ref 70–99)

## 2012-12-06 LAB — BLOOD GAS, ARTERIAL
Bicarbonate: 19.9 mEq/L — ABNORMAL LOW (ref 20.0–24.0)
Drawn by: 308601
O2 Content: 2 L/min
pCO2 arterial: 33.6 mmHg — ABNORMAL LOW (ref 35.0–45.0)
pO2, Arterial: 72.8 mmHg — ABNORMAL LOW (ref 80.0–100.0)

## 2012-12-06 LAB — CBC
HCT: 18.2 % — ABNORMAL LOW (ref 36.0–46.0)
Hemoglobin: 6 g/dL — CL (ref 12.0–15.0)
Hemoglobin: 8.3 g/dL — ABNORMAL LOW (ref 12.0–15.0)
MCH: 32.8 pg (ref 26.0–34.0)
MCH: 32.9 pg (ref 26.0–34.0)
MCHC: 33 g/dL (ref 30.0–36.0)
MCHC: 34.4 g/dL (ref 30.0–36.0)
Platelets: 236 10*3/uL (ref 150–400)
RBC: 1.83 MIL/uL — ABNORMAL LOW (ref 3.87–5.11)
RDW: 17.1 % — ABNORMAL HIGH (ref 11.5–15.5)

## 2012-12-06 LAB — MAGNESIUM: Magnesium: 1.6 mg/dL (ref 1.5–2.5)

## 2012-12-06 LAB — BASIC METABOLIC PANEL
BUN: 43 mg/dL — ABNORMAL HIGH (ref 6–23)
CO2: 22 mEq/L (ref 19–32)
Chloride: 115 mEq/L — ABNORMAL HIGH (ref 96–112)
Creatinine, Ser: 2.33 mg/dL — ABNORMAL HIGH (ref 0.50–1.10)
GFR calc Af Amer: 26 mL/min — ABNORMAL LOW (ref >90)
Potassium: 2.7 mEq/L — CL (ref 3.5–5.1)

## 2012-12-06 MED ORDER — POTASSIUM CHLORIDE CRYS ER 20 MEQ PO TBCR
40.0000 meq | EXTENDED_RELEASE_TABLET | Freq: Once | ORAL | Status: AC
  Administered 2012-12-06: 40 meq via ORAL
  Filled 2012-12-06: qty 2

## 2012-12-06 MED ORDER — POTASSIUM CHLORIDE 10 MEQ/50ML IV SOLN
10.0000 meq | INTRAVENOUS | Status: AC
  Administered 2012-12-06 (×4): 10 meq via INTRAVENOUS
  Filled 2012-12-06: qty 200

## 2012-12-06 MED ORDER — FUROSEMIDE 40 MG PO TABS
40.0000 mg | ORAL_TABLET | Freq: Every day | ORAL | Status: DC
  Administered 2012-12-06 – 2012-12-10 (×5): 40 mg via ORAL
  Filled 2012-12-06 (×5): qty 1

## 2012-12-06 MED ORDER — FENTANYL CITRATE 0.05 MG/ML IJ SOLN
50.0000 ug | INTRAMUSCULAR | Status: DC | PRN
Start: 2012-12-06 — End: 2012-12-10
  Administered 2012-12-06: 50 ug via INTRAVENOUS
  Filled 2012-12-06: qty 2

## 2012-12-06 NOTE — Clinical Social Work Psychosocial (Signed)
Clinical Social Work Department BRIEF PSYCHOSOCIAL ASSESSMENT 12/06/2012  Patient:  [name] [name], [name] [name]     Account Number:  [id number]     Admit date:  11/30/2012  Clinical Social Worker:  Jodelle Red  Date/Time:  12/06/2012 10:30 AM  Referred by:  CSW  Date Referred:  12/05/2012 Referred for  Behavioral Health Issues  Substance Abuse   Other Referral:   Pt needs PT consult and possible psych consult   Interview type:  Patient Other interview type:   chart review and discussion with RN    PSYCHOSOCIAL DATA Living Status:  FAMILY Admitted from facility:   Level of care:   Primary support name:  [name] [name] Primary support relationship to patient:  CHILD, ADULT Degree of support available:   good from boyfriend-[name] [name] and daughter-[name] [name]  She reports both know about her ETOH issues    CURRENT CONCERNS Current Concerns  Post-Acute Placement   Other Concerns:   wants to return to [facility] at d/c.    SOCIAL WORK ASSESSMENT / PLAN CSW met with Pt to check in and assess needs due to being admitted from [facility]. Pt has long hx of ETOH abuse and depressive disorder. She reports she has been to multiple tx facilities to assist her and was most recently at [facility] until fell and admitted here. She was at [facility] voluntarily and is eager to return when medically able. She is open to other options, but is eager to complete her treatment at d/c.  Pt has good support from her bf and daughter. She lives with them and her two grand children. She states they are a good source of support.   Assessment/plan status:  Psychosocial Support/Ongoing Assessment of Needs Other assessment/ plan:   assist with return to [facility] or other dispo as appropriate   Information/referral to community resources:   sub abuse resources.    PATIENT'S/FAMILY'S RESPONSE TO PLAN OF CARE: Pt very open to meeting with CSW and agrees to CSW following for assistance with sub abuse counseling follow up. Pt may  need psych consult to assist. CSW to follow.    Doreen Salvage, LCSW ICU/Stepdown Clinical Social Worker [facility] Cell [phone] Hours 8am-1200pm M-F

## 2012-12-06 NOTE — Progress Notes (Signed)
eLink Physician-Brief Progress Note Patient Name: [name] [name] DOB: 1956-01-31 MRN: [id number]  Date of Service  12/06/2012   HPI/Events of Note   K 2.7   Hgb 6.0  eICU Interventions  Transfuse one unit prbc Give KCL IV x 4 runs each   Intervention Category Major Interventions: Electrolyte abnormality - evaluation and management  Shan Levans 12/06/2012, 6:03 AM

## 2012-12-06 NOTE — Progress Notes (Signed)
Attempted to ambulate patient - she became dizzy while dangling - was able to take 6 steps in room and back to bed - legs shaking and too weak to walk further - VSS

## 2012-12-06 NOTE — Progress Notes (Signed)
eLink Nursing ICU Electrolyte Replacement Protocol  Patient Name: [name] [name] DOB: 05/04/1956 MRN: [id number]  Date of Service  12/06/2012 K+ 2.7 Not Replaced. Pt does not fit ICU elctrolyte replacement protocol criteria.  MD notified   HPI/Events of Note    Recent Labs Lab 12/01/12 0230  12/02/12 0418 12/03/12 0530 12/03/12 1525 12/04/12 0500 12/05/12 0454 12/05/12 1500 12/06/12 0500  NA 132*  < > 135 140 143 142 145 147* 146*  K 5.4*  < > 3.8 3.1* 3.0* 4.0 2.9* 2.7* 2.7*  CL 104  < > 110 111 112 112 114* 117* 115*  CO2 15*  < > 16* 18* 20 20 22 21 22   GLUCOSE 129*  < > 85 176* 126* 130* 128* 112* 90  BUN 36*  < > 30* 28* 28* 29* 38* 42* 43*  CREATININE 2.08*  < > 1.88* 1.85* 1.87* 2.02* 2.33* 2.32* 2.33*  CALCIUM 7.9*  < > 7.4* 7.6* 7.6* 7.6* 7.3* 7.2* 7.1*  MG 1.5  --  1.0* 1.5  --  2.1 1.9  --  1.6  PHOS 3.4  --  3.6 4.9*  --  5.6*  --   --   --   < > = values in this interval not displayed.  Estimated Creatinine Clearance: 20.8 ml/min (by C-G formula based on Cr of 2.33).  Intake/Output     07/03 0701 - 07/04 0700   P.O. 240   I.V. (mL/kg) 199.5 (4.1)   Other 180   NG/GT 53   IV Piggyback 100   Total Intake(mL/kg) 772.5 (16)   Urine (mL/kg/hr) 1425 (1.2)   Total Output 1425   Net -652.5       Stool Occurrence 7 x    - I/O DETAILED x24h    Total I/O In: 190 [I.V.:10; Other:180] Out: 675 [Urine:675] - I/O THIS SHIFT    ASSESSMENT   eICURN Interventions     ASSESSMENT: MAJOR ELECTROLYTE    [name] [name] 12/06/2012, 6:16 AM

## 2012-12-06 NOTE — Progress Notes (Signed)
Name: [name] [name] MRN: [id number] DOB: 1955/09/26    LOS: 6  Referring Provider:   Reason for Referral:  Aspiration pneumonia  PULMONARY / CRITICAL CARE MEDICINE  Brief patient description:  57 y/o female in rehab for EtOH problem + psych history, found to be drowsy, had bloody vomitus, likely aspirated, had fever + tachy + high WBC + hypoxia, AKI, likely severe sepsis due to asp pneumonia. Also had a fall 6/28 with left humeral fracture.  Instruments ET tube 6/28>>>7/3 L IJ TLC 6/30>>> PIV 6/28  Cultures Blood 6/28>>>not sent  Urine 6/28>>>not sent  Sputum 6/28>>> Few staph aureus (MSSA) BC x 2 6/30 >> UC 6/30 >> negative Sputum 6/30 >>neg Pleural 7/2 >>  Abx Vanc 6/28>>> 7/2 Zosyn  6/28>>> 7/2 Levaquin 7/2 (MSSA PNA) >>   Events Since Admission: Intubated 6/28 EGD 6/29 small mallory weiss tear-no active bleed  Thoracentesis 7/2  Current Status:  Extubated without incident 7/3 Weak when trying to sit up Note Hgb drop am 7/4 > no hematemesis, hemodynamically stable, brown stools reported PRBC x 1 planned for this am  Vital Signs: Temp:  [97 F (36.1 C)-98.2 F (36.8 C)] 97.7 F (36.5 C) (07/04 0700) Pulse Rate:  [59-102] 62 (07/04 0700) Resp:  [13-25] 15 (07/04 0700) BP: (113-154)/(64-97) 150/73 mmHg (07/04 0700) SpO2:  [97 %-100 %] 97 % (07/04 0700) Weight:  [48.2 kg (106 lb 4.2 oz)] 48.2 kg (106 lb 4.2 oz) (07/04 0400)  Physical Examination: General:  Intubated, sedated Neuro:  No focal deficit, EOM full and equal, pupils equal but sluggish, follows commands HEENT:  Atraumatic, trachea midline, ET tube well positioned Neck:  No enlarged LN, trachea midline, no trauma Cardiovascular:  RRR, tachycardic Lungs:  Decreased breath sounds bilaterally Abdomen:  Soft, no guarding, active bowel sounds.  Musculoskeletal:  Thin and weak appearing, no extremity edema Skin:  No new rash  ASSESSMENT AND PLAN  PULMONARY  Recent Labs Lab 12/02/12 1418  12/03/12 0500 12/04/12 0455 12/05/12 0419 12/06/12 0435  PHART 7.263* 7.280* 7.333* 7.325* 7.390  PCO2ART 33.1* 34.5* 35.1 38.8 33.6*  PO2ART 81.7 89.5 83.1 134.0* 72.8*  HCO3 14.5* 15.7* 18.1* 19.6* 19.9*  O2SAT 94.8 95.9 95.5 98.3 94.3    CXR: 7/4 >> slight improvement B infiltrates/atx, effusions, some RUL linear atx  A:  VDRF due to bilateral diffuse pulmonary infiltrates. Likely element of aspiration and ALI, but also probably element of pulmonary edema. Extubated 7/3 Pleural Effusion - transudative by light's criteria following thoracentesis. This in combination w/ bilateral infiltrates in setting of HTN raises question of flash edema.  P:   -wean FIO2 -manage HTN  -see CV and ID sections  CARDIOVASCULAR  Recent Labs Lab 11/30/12 1806 12/02/12 1325  LATICACIDVEN 1.09 0.9   ECG:  No tall peaked t-wave, sinus tachy  A:  Septic shock -now resolved Cortisol 10 6/29  P:  - Restarted home amlodipine for hypertension - PRN Hydralazine (Goal < 160) - Will add back lasix 7/4, follow S Cr (remains net positive for hospitalization +4L)  RENAL  Recent Labs Lab 12/01/12 0230  12/02/12 0418 12/03/12 0530 12/03/12 1525 12/04/12 0500 12/05/12 0454 12/05/12 1500 12/06/12 0500  NA 132*  < > 135 140 143 142 145 147* 146*  K 5.4*  < > 3.8 3.1* 3.0* 4.0 2.9* 2.7* 2.7*  CL 104  < > 110 111 112 112 114* 117* 115*  CO2 15*  < > 16* 18* 20 20 22 21 22   BUN 36*  < >  30* 28* 28* 29* 38* 42* 43*  CREATININE 2.08*  < > 1.88* 1.85* 1.87* 2.02* 2.33* 2.32* 2.33*  CALCIUM 7.9*  < > 7.4* 7.6* 7.6* 7.6* 7.3* 7.2* 7.1*  MG 1.5  --  1.0* 1.5  --  2.1 1.9  --  1.6  PHOS 3.4  --  3.6 4.9*  --  5.6*  --   --   --   < > = values in this interval not displayed. Intake/Output     07/03 0701 - 07/04 0700 07/04 0701 - 07/05 0700   P.O. 240    I.V. (mL/kg) 199.5 (4.1)    Other 240    NG/GT 53    IV Piggyback 150    Total Intake(mL/kg) 882.5 (18.3)    Urine (mL/kg/hr) 1525 (1.3)     Drains     Total Output 1525     Net -642.5          Stool Occurrence 7 x     Foley:  yes  A:   AKI likely due to sepsis, dehydration (nml Scr baseline-03/2012 ): also need to back off on diuretic  Hypomagnesium  Hypokalemia P:   - Follow BMP - add po potassium to IV replacement ordered overnight - Strict I&O    GASTROINTESTINAL  Recent Labs Lab 11/30/12 1744 12/04/12 1230  AST 31  --   ALT 8  --   ALKPHOS 242*  --   BILITOT 0.2*  --   PROT 8.7* 6.2  ALBUMIN 2.0*  --    A:   UGI bleed, S/p EGD 6/29 -small mallory weiss tear w/ out active bleeding P:   - appreciate GI input  - Hgb drop 7/3-4 but no hemodynamic instability, no hematemesis, no melena. Will wait on calling Gi at this time, follow CBC. If Hgb does not hold after PRBC then may need repeat EGD - continue PPI per GI - continue enteral feeding  HEMATOLOGIC  Recent Labs Lab 11/30/12 1744  12/02/12 0418 12/03/12 0530 12/04/12 0500 12/05/12 0454 12/06/12 0500  HGB 7.4*  < > 7.9* 7.4* 7.2* 7.2* 6.0*  HCT 23.6*  < > 23.7* 22.1* 21.2* 21.3* 18.2*  PLT 184  < > 186 213 203 201 212  INR 1.25  --   --   --   --   --   --   APTT 40*  --   --   --   --   --   --   < > = values in this interval not displayed. A:  Anemia + leukocytosis, likely from UGI bleed + sepsis/PNA P:  - PRBC ordered and about to be given now - follow CBC q12h  INFECTIOUS  Recent Labs Lab 12/02/12 0418 12/02/12 1325 12/03/12 0530 12/04/12 0500 12/05/12 0454 12/06/12 0500  WBC 38.7*  --  33.9* 22.1* 15.7* 12.0*  PROCALCITON  --  3.25  --   --   --   --    Cultures: 6/30 pan cx  Antibiotics: levoflox  A:   Likely aspiration pneumonia: SA in sputum (MSSA) Temp tr down, wbc tr down , cxr decreased aeration  P:   - Continue Levaquin to complete 10d  ENDOCRINE  Recent Labs Lab 12/05/12 1558 12/05/12 1955 12/05/12 2328 12/06/12 0342 12/06/12 0744  GLUCAP 85 93 129* 87 75   A:  Mildly hypoglycemic, probably from  sepsis and poor PO intake P:   - Follow CBG - d/c stress steroids 7/4  NEUROLOGIC  A:  Altered mental status, probably multifactorial: sepsis/PNA, psych history, maybe some component from the tramadol, hx EtOH > improving Chronic pain + new humeral fracture P:   - fentanyl ordered  MusculoSkeletal  A:  Left humoral neck fracture  P: Immobilize LUE Ortho consult when medically appropriate.  Fentanyl prn  BEST PRACTICE / DISPOSITION Level of Care:  Critical Care MICU Primary Service:  PCCM Consultants:  GI Code Status:  Full Diet:  NPO for now DVT Px:  SCD GI Px:  Protonix  Skin Integrity:  Routine care Social / Family:  Sister and daughter  Today's Summary: Mrs. Modisette continues to improve following her UGIB. Receiving 1u PRBC 7/4 but without evidence for active bleeding otherwise.  Add back lasix today. Follow CBC post transfusion   Levy Pupa, MD, PhD 12/06/2012, 8:41 AM [facility] [phone] or if no answer [phone]

## 2012-12-07 LAB — BASIC METABOLIC PANEL WITH GFR
BUN: 40 mg/dL — ABNORMAL HIGH (ref 6–23)
CO2: 18 meq/L — ABNORMAL LOW (ref 19–32)
Calcium: 7.2 mg/dL — ABNORMAL LOW (ref 8.4–10.5)
Chloride: 113 meq/L — ABNORMAL HIGH (ref 96–112)
Creatinine, Ser: 2.27 mg/dL — ABNORMAL HIGH (ref 0.50–1.10)
GFR calc Af Amer: 27 mL/min — ABNORMAL LOW
GFR calc non Af Amer: 23 mL/min — ABNORMAL LOW
Glucose, Bld: 73 mg/dL (ref 70–99)
Potassium: 3.1 meq/L — ABNORMAL LOW (ref 3.5–5.1)
Sodium: 144 meq/L (ref 135–145)

## 2012-12-07 LAB — CBC
HCT: 23.1 % — ABNORMAL LOW (ref 36.0–46.0)
Hemoglobin: 8 g/dL — ABNORMAL LOW (ref 12.0–15.0)
MCH: 33.3 pg (ref 26.0–34.0)
MCHC: 34.6 g/dL (ref 30.0–36.0)
MCV: 96.3 fL (ref 78.0–100.0)
Platelets: 241 K/uL (ref 150–400)
RBC: 2.4 MIL/uL — ABNORMAL LOW (ref 3.87–5.11)
RDW: 17.7 % — ABNORMAL HIGH (ref 11.5–15.5)
WBC: 11.9 K/uL — ABNORMAL HIGH (ref 4.0–10.5)

## 2012-12-07 LAB — TYPE AND SCREEN
ABO/RH(D): A POS
Antibody Screen: NEGATIVE
Unit division: 0

## 2012-12-07 LAB — GLUCOSE, CAPILLARY
Glucose-Capillary: 69 mg/dL — ABNORMAL LOW (ref 70–99)
Glucose-Capillary: 73 mg/dL (ref 70–99)
Glucose-Capillary: 79 mg/dL (ref 70–99)
Glucose-Capillary: 131 mg/dL — ABNORMAL HIGH (ref 70–99)
Glucose-Capillary: 100 mg/dL — ABNORMAL HIGH (ref 70–99)

## 2012-12-07 LAB — MAGNESIUM: Magnesium: 1.4 mg/dL — ABNORMAL LOW (ref 1.5–2.5)

## 2012-12-07 MED ORDER — PANCRELIPASE (LIP-PROT-AMYL) 12000-38000 UNITS PO CPEP
1.0000 | ORAL_CAPSULE | Freq: Three times a day (TID) | ORAL | Status: DC
  Administered 2012-12-07 – 2012-12-10 (×9): 1 via ORAL
  Filled 2012-12-07 (×11): qty 1

## 2012-12-07 MED ORDER — KCL IN DEXTROSE-NACL 40-5-0.45 MEQ/L-%-% IV SOLN
INTRAVENOUS | Status: AC
  Administered 2012-12-07: 1000 mL via INTRAVENOUS
  Administered 2012-12-08 – 2012-12-10 (×2): via INTRAVENOUS
  Filled 2012-12-07 (×4): qty 1000

## 2012-12-07 NOTE — Progress Notes (Signed)
[name] [name] 1233 TRANSFERRED TO 1601- IN W/CHAIR WITH NT. PATIENT SLEEPY STILL. NO CHANGE IN CONDITION. REPORT CALLED TO KAITLYN RN.

## 2012-12-07 NOTE — Progress Notes (Signed)
57 yo female s/p fall 1 week ago Left proximal humerus fracture minimally displaced on xray Shoulder immobilizer for now Ordered Full consult to follow

## 2012-12-07 NOTE — Progress Notes (Signed)
Name: [name] [name] MRN: [id number] DOB: [birth year]    LOS: 7  Referring Provider:   Reason for Referral:  Aspiration pneumonia  PULMONARY / CRITICAL CARE MEDICINE  Brief patient description:  57 y/o female in rehab for EtOH problem + psych history, found to be drowsy, had bloody vomitus, likely aspirated, had fever + tachy + high WBC + hypoxia, AKI, likely severe sepsis due to asp pneumonia. Also had a fall 6/28 with left humeral fracture.  Instruments ET tube 6/28>>>7/3 L IJ TLC 6/30>>>7/05  Cultures Sputum 6/28>>> Few staph aureus (MSSA) BC x 2 6/30 >> UC 6/30 >> negative Sputum 6/30 >>neg Pleural 7/2 >> NEG  Abx Vanc 6/28>>> 7/2 Zosyn  6/28>>> 7/2 Levaquin 7/2 (MSSA PNA) >>   Events Since Admission: Intubated 6/28 EGD 6/29 small mallory weiss tear-no active bleed  Thoracentesis 7/2  Current Status:  No distress. No new complaints. Conts to have L shoulder pain  Vital Signs: Temp:  [98.6 F (37 C)-99.9 F (37.7 C)] 99.5 F (37.5 C) (07/05 1400) Pulse Rate:  [75-94] 84 (07/05 1400) Resp:  [19-29] 20 (07/05 1400) BP: (133-174)/(66-121) 149/79 mmHg (07/05 1400) SpO2:  [90 %-94 %] 90 % (07/05 1400) Weight:  [51.1 kg (112 lb 10.5 oz)] 51.1 kg (112 lb 10.5 oz) (07/05 0500)  Physical Examination: General:  NAD Neuro: no deficits noted HEENT: WNL Neck: no JVD Cardiovascular:  RRR s M Lungs: clear anteriorly Abdomen:  Soft, NT, NABS Ext: no edema   BMET    Component Value Date/Time   NA 144 12/07/2012 0440   K 3.1* 12/07/2012 0440   CL 113* 12/07/2012 0440   CO2 18* 12/07/2012 0440   GLUCOSE 73 12/07/2012 0440   BUN 40* 12/07/2012 0440   CREATININE 2.27* 12/07/2012 0440   CREATININE 1.51* 11/12/2012 1135   CALCIUM 7.2* 12/07/2012 0440   CALCIUM 5.6* 12/22/2010 1010   GFRNONAA 23* 12/07/2012 0440   GFRAA 27* 12/07/2012 0440    CBC    Component Value Date/Time   WBC 11.9* 12/07/2012 0440   WBC 7.2 10/26/2011 1336   RBC 2.40* 12/07/2012 0440   RBC 2.98* 10/26/2011 1336    HGB 8.0* 12/07/2012 0440   HGB 9.7* 10/26/2011 1336   HCT 23.1* 12/07/2012 0440   HCT 28.8* 10/26/2011 1336   PLT 241 12/07/2012 0440   PLT 125* 10/26/2011 1336   MCV 96.3 12/07/2012 0440   MCV 96.6 10/26/2011 1336   MCH 33.3 12/07/2012 0440   MCH 32.6 10/26/2011 1336   MCHC 34.6 12/07/2012 0440   MCHC 33.7 10/26/2011 1336   RDW 17.7* 12/07/2012 0440   RDW 13.6 10/26/2011 1336   LYMPHSABS 3.2 11/30/2012 1744   LYMPHSABS 2.7 10/26/2011 1336   MONOABS 4.2* 11/30/2012 1744   MONOABS 0.8 10/26/2011 1336   EOSABS 0.0 11/30/2012 1744   EOSABS 0.1 10/26/2011 1336   BASOSABS 0.0 11/30/2012 1744   BASOSABS 0.0 10/26/2011 1336    CXR: no new film   ASSESSMENT AND PLAN  PULMONARY A:  VDRF, resolved.  Pleural Effusion - transudative by light's criteria following thoracentesis.  P:   -wean FIO2  CARDIOVASCULAR A:  Septic shock, resolved Cortisol 10 6/29  H/O Htn P:  Cont current rx   RENAL A:   AKI, nonoliguric  P:   - monitor chemistries, renal panel -Correct electrolytes as indicated -Avoid nephrotoxins   GASTROINTESTINAL  A:   UGI bleed, S/p EGD 6/29 -small mallory weiss tear w/ out active bleeding P:   -  Begin full liquids 7/05  HEMATOLOGIC A:  Anemia, acute blood los due to UGIB  No current active bleeding P:  -monitor CBC intermittently  INFECTIOUS A:   Likely aspiration pneumonia: SA in sputum (MSSA) P:   -micro and abx as above -Plan 10 days abx  ENDOCRINE A:  Mildly hypoglycemic, resolved P:   -monitor  NEUROLOGIC  A:  Altered mental status, resolved Pain P:   -minimize sedatives -PRN analgesics  MusculoSkeletal  A:  Left humoral neck fracture  P: Ortho called 7/05  Transferred to med-surg 7/05. TRH to assume care 7/06 AM and PCCM to sign off   Billy Fischer, MD ; [facility] [phone].  After 5:30 PM or weekends, call [phone]

## 2012-12-08 DIAGNOSIS — D62 Acute posthemorrhagic anemia: Secondary | ICD-10-CM

## 2012-12-08 DIAGNOSIS — E876 Hypokalemia: Secondary | ICD-10-CM | POA: Diagnosis not present

## 2012-12-08 LAB — BODY FLUID CULTURE
Culture: NO GROWTH
Special Requests: NORMAL

## 2012-12-08 LAB — CULTURE, BLOOD (ROUTINE X 2)
Culture: NO GROWTH
Culture: NO GROWTH
Culture: NO GROWTH

## 2012-12-08 LAB — CBC
Hemoglobin: 8.9 g/dL — ABNORMAL LOW (ref 12.0–15.0)
MCHC: 34 g/dL (ref 30.0–36.0)
RDW: 17.5 % — ABNORMAL HIGH (ref 11.5–15.5)
WBC: 8.3 10*3/uL (ref 4.0–10.5)

## 2012-12-08 LAB — BASIC METABOLIC PANEL
BUN: 31 mg/dL — ABNORMAL HIGH (ref 6–23)
GFR calc Af Amer: 30 mL/min — ABNORMAL LOW (ref >90)
GFR calc non Af Amer: 26 mL/min — ABNORMAL LOW (ref >90)
Potassium: 3.2 mEq/L — ABNORMAL LOW (ref 3.5–5.1)

## 2012-12-08 MED ORDER — PANTOPRAZOLE SODIUM 40 MG PO TBEC
40.0000 mg | DELAYED_RELEASE_TABLET | Freq: Every day | ORAL | Status: DC
  Administered 2012-12-08 – 2012-12-10 (×3): 40 mg via ORAL
  Filled 2012-12-08 (×2): qty 1

## 2012-12-08 MED ORDER — VITAMIN B-1 100 MG PO TABS
100.0000 mg | ORAL_TABLET | Freq: Every day | ORAL | Status: DC
  Administered 2012-12-08 – 2012-12-10 (×3): 100 mg via ORAL
  Filled 2012-12-08 (×3): qty 1

## 2012-12-08 MED ORDER — POTASSIUM CHLORIDE CRYS ER 20 MEQ PO TBCR
20.0000 meq | EXTENDED_RELEASE_TABLET | Freq: Once | ORAL | Status: AC
  Administered 2012-12-08: 20 meq via ORAL
  Filled 2012-12-08: qty 1

## 2012-12-08 NOTE — Consult Note (Signed)
Reason for Consult: Left shoulder pain Referring Physician: dr simonds  [name] [name] is an 57 y.o. female.  HPI: Along the is a 57 year old patient admitted to the hospital for rehabilitation who describes falling on her left arm a week year. Her pain is improved. She denies any numbness or tingling in her hand and denies any other orthopedic complaints.  Past Medical History  Diagnosis Date  . Anemia, B12 deficiency   . History of acute pancreatitis   . Right knee pain     No recent imaging on chart  . 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 Dr. Su Hilt, [facility]  . Hypertriglyceridemia   . GERD (gastroesophageal reflux disease)   . Vitamin D deficiency   . Subdural hematoma 02/2008    Likely 2/2 trauma from seizure from EtOH withdrawal, chronic in nature, sees Dr. Robyne Askew. Most recent CT head 10/2009 showing stable but persistent hematoma without mass effect.  . History of seizure disorder     Likely 2/2 alcohol abuse  . Hypocalcemia   . Hypomagnesemia   . Failure to thrive in childhood     Unclear etiology  . HTN (hypertension)   . Thrombocytopenia   . Anemia, macrocytic   . Hepatomegaly     On exam  . Alcohol abuse   . Joint pain   . Alcohol abuse   . Arthritis   . Vitamin D deficiency   . Menopause   . Pancreatitis   . Insomnia   . Hyperlipidemia   . Sinusitis   . Pernicious anemia   . Subdural hematoma   . Macrocytic anemia   . Hepatomegaly   . 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 Seizures     1.5 YRS  LAST ONE  . Depression   . Fx humeral neck 04/17/2011    Transverse fracture- minimally displaced- managed as outpatient   . ABNORMAL PAP SMEAR, LGSIL 07/23/2008    Annotation: HPV positive CIN I Dr. Su Hilt, [facility] Qualifier: Diagnosis of  By: Danae Chen    . Pneumonia 05/20/2012  . Hepatitis     HEPATOMEGALY     Past Surgical History  Procedure  Laterality Date  . Cesarean section  1983  . Esophagogastroduodenoscopy  07/11/2011    Procedure: ESOPHAGOGASTRODUODENOSCOPY (EGD);  Surgeon: Theda Belfast, MD;  Location: [facility];  Service: Endoscopy;  Laterality: N/A;  . Colonoscopy  07/11/2011    Procedure: COLONOSCOPY;  Surgeon: Theda Belfast, MD;  Location: [facility];  Service: Endoscopy;  Laterality: N/A;  . Eye surgery      LEFT EYE YRS AGO   . Rt colectomy  08/28/2011  . Esophagogastroduodenoscopy N/A 12/01/2012    Procedure: ESOPHAGOGASTRODUODENOSCOPY (EGD);  Surgeon: Hilarie Fredrickson, MD;  Location: [facility];  Service: Endoscopy;  Laterality: N/A;    Family History  Problem Relation Age of Onset  . Cancer Mother     Died from stomach cancer and "flesh eating rash  . Heart failure Father     Died in 41s from an MI  . Alcohol abuse Sister     Twin sister drinks a lot, as did both her parents and brothers  . Stroke Brother     Has 7 brothers, 1 with CVA    Social History:  reports that she quit smoking about 2 years ago. Her smoking use included Cigarettes. She smoked 0.00 packs per  day. She has never used smokeless tobacco. She reports that she drinks about 6.0 ounces of alcohol per week. She reports that she does not use illicit drugs.  Allergies:  Allergies  Allergen Reactions  . Amitriptyline Hcl Swelling    In the face.  . Doxycycline Hyclate Itching    Feels like something crawling under her skin    Medications: I have reviewed the patient's current medications.  Results for orders placed during the hospital encounter of 11/30/12 (from the past 48 hour(s))  GLUCOSE, CAPILLARY     Status: Abnormal   Collection Time    12/06/12  4:11 PM      Result Value Range   Glucose-Capillary 107 (*) 70 - 99 mg/dL  CBC     Status: Abnormal   Collection Time    12/06/12  5:55 PM      Result Value Range   WBC 12.3 (*) 4.0 - 10.5 K/uL   RBC 2.52 (*) 3.87 - 5.11 MIL/uL   Hemoglobin 8.3 (*) 12.0 - 15.0 g/dL   Comment:  POST TRANSFUSION SPECIMEN     DELTA CHECK NOTED   HCT 24.1 (*) 36.0 - 46.0 %   MCV 95.6  78.0 - 100.0 fL   MCH 32.9  26.0 - 34.0 pg   MCHC 34.4  30.0 - 36.0 g/dL   RDW 40.9 (*) 81.1 - 91.4 %   Platelets 236  150 - 400 K/uL  GLUCOSE, CAPILLARY     Status: None   Collection Time    12/06/12  8:08 PM      Result Value Range   Glucose-Capillary 87  70 - 99 mg/dL  GLUCOSE, CAPILLARY     Status: None   Collection Time    12/07/12 12:21 AM      Result Value Range   Glucose-Capillary 79  70 - 99 mg/dL   Comment 1 Documented in Chart     Comment 2 Notify RN    CBC     Status: Abnormal   Collection Time    12/07/12  4:40 AM      Result Value Range   WBC 11.9 (*) 4.0 - 10.5 K/uL   RBC 2.40 (*) 3.87 - 5.11 MIL/uL   Hemoglobin 8.0 (*) 12.0 - 15.0 g/dL   HCT 78.2 (*) 95.6 - 21.3 %   MCV 96.3  78.0 - 100.0 fL   MCH 33.3  26.0 - 34.0 pg   MCHC 34.6  30.0 - 36.0 g/dL   RDW 08.6 (*) 57.8 - 46.9 %   Platelets 241  150 - 400 K/uL  BASIC METABOLIC PANEL     Status: Abnormal   Collection Time    12/07/12  4:40 AM      Result Value Range   Sodium 144  135 - 145 mEq/L   Potassium 3.1 (*) 3.5 - 5.1 mEq/L   Chloride 113 (*) 96 - 112 mEq/L   CO2 18 (*) 19 - 32 mEq/L   Glucose, Bld 73  70 - 99 mg/dL   BUN 40 (*) 6 - 23 mg/dL   Creatinine, Ser 6.29 (*) 0.50 - 1.10 mg/dL   Calcium 7.2 (*) 8.4 - 10.5 mg/dL   GFR calc non Af Amer 23 (*) >90 mL/min   GFR calc Af Amer 27 (*) >90 mL/min   Comment:            The eGFR has been calculated     using the CKD EPI equation.  This calculation has not been     validated in all clinical     situations.     eGFR's persistently     <90 mL/min signify     possible Chronic Kidney Disease.  MAGNESIUM     Status: Abnormal   Collection Time    12/07/12  4:40 AM      Result Value Range   Magnesium 1.4 (*) 1.5 - 2.5 mg/dL  PHOSPHORUS     Status: None   Collection Time    12/07/12  4:40 AM      Result Value Range   Phosphorus 3.7  2.3 - 4.6 mg/dL   GLUCOSE, CAPILLARY     Status: Abnormal   Collection Time    12/07/12  4:50 AM      Result Value Range   Glucose-Capillary 69 (*) 70 - 99 mg/dL   Comment 1 Documented in Chart     Comment 2 Notify RN    GLUCOSE, CAPILLARY     Status: None   Collection Time    12/07/12  5:10 AM      Result Value Range   Glucose-Capillary 73  70 - 99 mg/dL  GLUCOSE, CAPILLARY     Status: Abnormal   Collection Time    12/07/12  8:08 AM      Result Value Range   Glucose-Capillary 131 (*) 70 - 99 mg/dL   Comment 1 Documented in Chart     Comment 2 Notify RN    GLUCOSE, CAPILLARY     Status: Abnormal   Collection Time    12/07/12 11:58 AM      Result Value Range   Glucose-Capillary 100 (*) 70 - 99 mg/dL   Comment 1 Notify RN     Comment 2 Documented in Chart    BASIC METABOLIC PANEL     Status: Abnormal   Collection Time    12/08/12  5:12 AM      Result Value Range   Sodium 142  135 - 145 mEq/L   Potassium 3.2 (*) 3.5 - 5.1 mEq/L   Chloride 111  96 - 112 mEq/L   CO2 21  19 - 32 mEq/L   Glucose, Bld 114 (*) 70 - 99 mg/dL   BUN 31 (*) 6 - 23 mg/dL   Creatinine, Ser 1.61 (*) 0.50 - 1.10 mg/dL   Calcium 7.1 (*) 8.4 - 10.5 mg/dL   GFR calc non Af Amer 26 (*) >90 mL/min   GFR calc Af Amer 30 (*) >90 mL/min   Comment:            The eGFR has been calculated     using the CKD EPI equation.     This calculation has not been     validated in all clinical     situations.     eGFR's persistently     <90 mL/min signify     possible Chronic Kidney Disease.  CBC     Status: Abnormal   Collection Time    12/08/12  5:12 AM      Result Value Range   WBC 8.3  4.0 - 10.5 K/uL   RBC 2.71 (*) 3.87 - 5.11 MIL/uL   Hemoglobin 8.9 (*) 12.0 - 15.0 g/dL   HCT 09.6 (*) 04.5 - 40.9 %   MCV 96.7  78.0 - 100.0 fL   MCH 32.8  26.0 - 34.0 pg   MCHC 34.0  30.0 - 36.0 g/dL   RDW 81.1 (*)  11.5 - 15.5 %   Platelets 256  150 - 400 K/uL    No results found.  Review of Systems  Constitutional: Negative.    HENT: Negative.   Eyes: Negative.   Respiratory: Negative.   Cardiovascular: Negative.   Gastrointestinal: Negative.   Musculoskeletal: Positive for joint pain.  Skin: Negative.   Neurological: Negative.   Psychiatric/Behavioral: Negative.    Blood pressure 144/83, pulse 80, temperature 98.7 F (37.1 C), temperature source Oral, resp. rate 18, height 5\' 3"  (1.6 m), weight 51.1 kg (112 lb 10.5 oz), SpO2 90.00%. Physical Exam  Constitutional: She appears well-developed.  HENT:  Head: Normocephalic.  Eyes: Pupils are equal, round, and reactive to light.  Neck: Normal range of motion.  Cardiovascular: Normal rate.   Respiratory: Effort normal.  Neurological: She is alert.  Skin: Skin is warm.  Psychiatric: She has a normal mood and affect.   left shoulder examined. She is in a sling. Radial pulse intact. Reasonable grip strength. Minimal to moderate pain with range of motion. Shoulders located.  Assessment/Plan: Radiographs show mildly angulated proximal humerus fracture on the left-hand side impression moderately angulated humeral fracture proximally and plan is for her sling immobilization for another 10-14 days followed by discontinuation sling with physical therapy initiation to begin range of motion exercises. All this is explained to the patient. She will need a followup appointment in 2 weeks for repeat radiographs and initiation of range of motion exercises. I did tell her that like her to come out of the sling at least once a day to straighten her arms at her elbow doesn't become contracted  DEAN,GREGORY SCOTT 12/08/2012, 1:08 PM

## 2012-12-08 NOTE — Evaluation (Signed)
Physical Therapy Evaluation Patient Details Name: [name] [name] MRN: [id number] DOB: June 23, 1955 Today's Date: 12/08/2012 Time: 4098-1191 PT Time Calculation (min): 30 min  PT Assessment / Plan / Recommendation History of Present Illness  This is an unfortunate 65 AAF who is known to have some psychiatric problem and history of EtOH abuse who was in an inpatient rehab. Apparently yesterday she had a fall with resulting fracture of the left humeral neck. Today she woke up around 4AM, and was noted to be drowsy and not very responsive, which is unusual for her  Clinical Impression   Pt will benefit from PT to maximize independence for home with family vs SNF post acute    PT Assessment  Patient needs continued PT services    Follow Up Recommendations  Home health PT;Supervision for mobility/OOB (pt states she  wants to go home; may need SNF?)    Does the patient have the potential to tolerate intense rehabilitation      Barriers to Discharge        Equipment Recommendations  Cane    Recommendations for Other Services     Frequency Min 5X/week    Precautions / Restrictions Precautions Precautions: Shoulder Shoulder Interventions: Shoulder sling/immobilizer Restrictions Other Position/Activity Restrictions: assume NWB LUE until further notice   Pertinent Vitals/Pain Min c/o pain L shoulder      Mobility  Bed Mobility Bed Mobility: Supine to Sit Supine to Sit: 3: Mod assist Details for Bed Mobility Assistance: assist with scooting laterally and trunk to upright Transfers Transfers: Sit to Stand;Stand to Sit Sit to Stand: 3: Mod assist;From bed Stand to Sit: 4: Min assist;To chair/3-in-1 Details for Transfer Assistance: required assist with wt shift to stand and initial balance (posterior LOB); verbal cues to not use LUE to assist self to stand Ambulation/Gait Ambulation/Gait Assistance: 4: Min assist Ambulation Distance (Feet): 22 Feet Assistive device: 1 person hand  held assist Gait Pattern: Step-to pattern;Decreased step length - right;Decreased step length - left;Decreased hip/knee flexion - left;Decreased hip/knee flexion - right;Narrow base of support;Decreased trunk rotation Gait velocity: decr General Gait Details: very guarded, pt reports she is fearful due to fall    Exercises     PT Diagnosis: Difficulty walking;Generalized weakness  PT Problem List: Decreased range of motion;Decreased activity tolerance;Decreased balance;Decreased strength;Decreased knowledge of use of DME;Decreased mobility;Decreased knowledge of precautions;Decreased safety awareness PT Treatment Interventions: DME instruction;Gait training;Stair training;Functional mobility training;Therapeutic activities;Therapeutic exercise;Balance training;Patient/family education     PT Goals(Current goals can be found in the care plan section) Acute Rehab PT Goals Patient Stated Goal: home; she was in ETOH rehab before fall/[facility] adm PT Goal Formulation: With patient Time For Goal Achievement: 12/22/12 Potential to Achieve Goals: Good  Visit Information  Last PT Received On: 12/08/12 Assistance Needed: +1 History of Present Illness: This is an unfortunate 35 AAF who is known to have some psychiatric problem and history of EtOH abuse who was in an inpatient rehab. Apparently yesterday she had a fall with resulting fracture of the left humeral neck. Today she woke up around 4AM, and was noted to be drowsy and not very responsive, which is unusual for her       Prior Functioning  Home Living Family/patient expects to be discharged to:: Private residence Living Arrangements: Spouse/significant other Type of Home: House Home Access: Stairs to enter [employer] of Steps: 5 Entrance Stairs-Rails: None Home Layout: One level Prior Function Level of Independence: Independent with assistive device(s) Communication Communication: No difficulties  Cognition   Cognition Arousal/Alertness: Awake/alert Behavior During Therapy: WFL for tasks assessed/performed Overall Cognitive Status: Within Functional Limits for tasks assessed    Extremity/Trunk Assessment Upper Extremity Assessment Upper Extremity Assessment: LUE deficits/detail LUE Deficits / Details: AAROM L elbow WFL;  pt educated on  elbow ROM; LUE: Unable to fully assess due to immobilization Lower Extremity Assessment Lower Extremity Assessment: Generalized weakness;RLE deficits/detail;LLE deficits/detail RLE Deficits / Details: diffuse atrophy bil LEs Cervical / Trunk Assessment Cervical / Trunk Assessment: Normal   Balance Static Sitting Balance Static Sitting - Balance Support: Right upper extremity supported;Feet supported Static Sitting - Level of Assistance: 4: Min assist;5: Stand by assistance Static Sitting - Comment/# of Minutes: shoulder immobilizer adjusted (it was behind her back)  End of Session PT - End of Session Equipment Utilized During Treatment: Gait belt;Other (comment) (shoulder immobilizer) Activity Tolerance: Patient limited by fatigue Patient left: in chair;with call bell/phone within reach;with family/visitor present  GP     [facility] 12/08/2012, 3:53 PM

## 2012-12-08 NOTE — Progress Notes (Signed)
[facility] PROGRESS NOTE  [name] [name] ZOX:[id number] DOB: 1956-05-30 DOA: 11/30/2012  PCP: Aletta Edouard, MD  Brief HPI: 57 y/o female in rehab for EtOH problem + psych history, found to be drowsy, had bloody vomitus, likely aspirated, had fever + tachy + high WBC + hypoxia, AKI, likely severe sepsis due to asp pneumonia. Also had a fall 6/28 with left humeral fracture. She was intubated for respiratory failure and aspiration pneumonia. Admitted to ICU. She was also in septic shock and was noted to have GI bleeding. She underwent EGD. She had thoracentesis as well. She was subsequently extubated and transferred to floor.  Past medical history:  Past Medical History  Diagnosis Date  . Anemia, B12 deficiency   . History of acute pancreatitis   . Right knee pain     No recent imaging on chart  . 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 Dr. Su Hilt, [facility]  . Hypertriglyceridemia   . GERD (gastroesophageal reflux disease)   . Vitamin D deficiency   . Subdural hematoma 02/2008    Likely 2/2 trauma from seizure from EtOH withdrawal, chronic in nature, sees Dr. Robyne Askew. Most recent CT head 10/2009 showing stable but persistent hematoma without mass effect.  . History of seizure disorder     Likely 2/2 alcohol abuse  . Hypocalcemia   . Hypomagnesemia   . Failure to thrive in childhood     Unclear etiology  . HTN (hypertension)   . Thrombocytopenia   . Anemia, macrocytic   . Hepatomegaly     On exam  . Alcohol abuse   . Joint pain   . Alcohol abuse   . Arthritis   . Vitamin D deficiency   . Menopause   . Pancreatitis   . Insomnia   . Hyperlipidemia   . Sinusitis   . Pernicious anemia   . Subdural hematoma   . Macrocytic anemia   . Hepatomegaly   . 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 Seizures     1.5 YRS  LAST ONE  . Depression   . Fx humeral neck 04/17/2011    Transverse fracture- minimally  displaced- managed as outpatient   . ABNORMAL PAP SMEAR, LGSIL 07/23/2008    Annotation: HPV positive CIN I Dr. Su Hilt, [facility] Qualifier: Diagnosis of  By: Danae Chen    . Pneumonia 05/20/2012  . Hepatitis     HEPATOMEGALY     Consultants: PCCM,. [facility]  Procedures:  EGD 6/29 Small MWT  Central Line Left IJ 6/30 Pulled out 7/5  Thoracentesis 7/2 of clear pleural fluid. Was transudative.  Antibiotics: Iv Levaquin  Subjective: Patient feels well. No shortness of breath. Some cough. Some pain in left shoulder.  Objective: Vital Signs  Filed Vitals:   12/07/12 1300 12/07/12 1400 12/07/12 2100 12/08/12 0500  BP: 146/78 149/79 166/90 144/83  Pulse: 84 84 80 80  Temp: 99.9 F (37.7 C) 99.5 F (37.5 C) 98.7 F (37.1 C) 98.7 F (37.1 C)  TempSrc:   Oral Oral  Resp: 20 20 20 18   Height:      Weight:      SpO2: 91% 90% 90% 90%    Intake/Output Summary (Last 24 hours) at 12/08/12 1004 Last data filed at 12/08/12 0600  Gross per 24 hour  Intake 1601.67 ml  Output   1300 ml  Net 301.67 ml  Filed Weights   12/05/12 0400 12/06/12 0400 12/07/12 0500  Weight: 50.2 kg (110 lb 10.7 oz) 48.2 kg (106 lb 4.2 oz) 51.1 kg (112 lb 10.5 oz)    General appearance: alert, cooperative, appears stated age and no distress Head: Normocephalic, without obvious abnormality, atraumatic Resp: Few crackles at bases. No wheezing. Cardio: regular rate and rhythm, S1, S2 normal, no murmur, click, rub or gallop GI: soft, non-tender; bowel sounds normal; no masses,  no organomegaly Extremities: extremities normal, atraumatic, no cyanosis or edema Pulses: 2+ and symmetric Skin: Skin color, texture, turgor normal. No rashes or lesions Neurologic: Alert and oriented x 3. No focal deficits.  Lab Results:  Basic Metabolic Panel:  Recent Labs Lab 12/02/12 0418 12/03/12 0530  12/04/12 0500 12/05/12 0454 12/05/12 1500 12/06/12 0500 12/07/12 0440  12/08/12 0512  NA 135 140  < > 142 145 147* 146* 144 142  K 3.8 3.1*  < > 4.0 2.9* 2.7* 2.7* 3.1* 3.2*  CL 110 111  < > 112 114* 117* 115* 113* 111  CO2 16* 18*  < > 20 22 21 22  18* 21  GLUCOSE 85 176*  < > 130* 128* 112* 90 73 114*  BUN 30* 28*  < > 29* 38* 42* 43* 40* 31*  CREATININE 1.88* 1.85*  < > 2.02* 2.33* 2.32* 2.33* 2.27* 2.09*  CALCIUM 7.4* 7.6*  < > 7.6* 7.3* 7.2* 7.1* 7.2* 7.1*  MG 1.0* 1.5  --  2.1 1.9  --  1.6 1.4*  --   PHOS 3.6 4.9*  --  5.6*  --   --   --  3.7  --   < > = values in this interval not displayed.  Liver Function Tests:  Recent Labs Lab 12/04/12 1230  PROT 6.2   CBC:  Recent Labs Lab 12/05/12 0454 12/06/12 0500 12/06/12 1755 12/07/12 0440 12/08/12 0512  WBC 15.7* 12.0* 12.3* 11.9* 8.3  HGB 7.2* 6.0* 8.3* 8.0* 8.9*  HCT 21.3* 18.2* 24.1* 23.1* 26.2*  MCV 100.0 99.5 95.6 96.3 96.7  PLT 201 212 236 241 256   BNP (last 3 results)  Recent Labs  08/07/12 2242  PROBNP 1210.0*   CBG:  Recent Labs Lab 12/07/12 0021 12/07/12 0450 12/07/12 0510 12/07/12 0808 12/07/12 1158  GLUCAP 79 69* 73 131* 100*    Recent Results (from the past 240 hour(s))  MRSA PCR SCREENING     Status: None   Collection Time    11/30/12 10:28 PM      Result Value Range Status   MRSA by PCR NEGATIVE  NEGATIVE Final   Comment:            The GeneXpert MRSA Assay (FDA     approved for NASAL specimens     only), is one component of a     comprehensive MRSA colonization     surveillance program. It is not     intended to diagnose MRSA     infection nor to guide or     monitor treatment for     MRSA infections.  CULTURE, RESPIRATORY (NON-EXPECTORATED)     Status: None   Collection Time    12/01/12  4:26 AM      Result Value Range Status   Specimen Description TRACHEAL ASPIRATE   Final   Special Requests Normal   Final   Gram Stain     Final   Value: ABUNDANT WBC PRESENT, PREDOMINANTLY PMN     RARE SQUAMOUS EPITHELIAL CELLS  PRESENT     RARE GRAM  POSITIVE RODS   Culture     Final   Value: FEW STAPHYLOCOCCUS AUREUS     Note: RIFAMPIN AND GENTAMICIN SHOULD NOT BE USED AS SINGLE DRUGS FOR TREATMENT OF STAPH INFECTIONS.   Report Status 12/04/2012 FINAL   Final   Organism ID, Bacteria STAPHYLOCOCCUS AUREUS   Final  CULTURE, BLOOD (ROUTINE X 2)     Status: None   Collection Time    12/01/12  8:56 AM      Result Value Range Status   Specimen Description BLOOD LEFT ARM   Final   Special Requests BOTTLES DRAWN AEROBIC AND ANAEROBIC 3.5 CC EA   Final   Culture  Setup Time 12/01/2012 19:02   Final   Culture     Final   Value:        BLOOD CULTURE RECEIVED NO GROWTH TO DATE CULTURE WILL BE HELD FOR 5 DAYS BEFORE ISSUING A FINAL NEGATIVE REPORT   Report Status PENDING   Incomplete  CULTURE, BLOOD (ROUTINE X 2)     Status: None   Collection Time    12/01/12  9:03 AM      Result Value Range Status   Specimen Description BLOOD LEFT ARM   Final   Special Requests BOTTLES DRAWN AEROBIC AND ANAEROBIC 4.5 CC EA   Final   Culture  Setup Time 12/01/2012 19:02   Final   Culture     Final   Value:        BLOOD CULTURE RECEIVED NO GROWTH TO DATE CULTURE WILL BE HELD FOR 5 DAYS BEFORE ISSUING A FINAL NEGATIVE REPORT   Report Status PENDING   Incomplete  URINE CULTURE     Status: None   Collection Time    12/02/12 10:19 AM      Result Value Range Status   Specimen Description URINE, CATHETERIZED   Final   Special Requests NONE   Final   Culture  Setup Time 12/02/2012 23:00   Final   Colony Count NO GROWTH   Final   Culture NO GROWTH   Final   Report Status 12/03/2012 FINAL   Final  CULTURE, RESPIRATORY (NON-EXPECTORATED)     Status: None   Collection Time    12/02/12 11:55 AM      Result Value Range Status   Specimen Description SPUTUM   Final   Special Requests NONE   Final   Gram Stain     Final   Value: MODERATE WBC PRESENT, PREDOMINANTLY PMN     NO SQUAMOUS EPITHELIAL CELLS SEEN     NO ORGANISMS SEEN   Culture NO GROWTH 2 DAYS   Final    Report Status 12/04/2012 FINAL   Final  CULTURE, BLOOD (ROUTINE X 2)     Status: None   Collection Time    12/02/12 12:20 PM      Result Value Range Status   Specimen Description BLOOD RIGHT FOOT   Final   Special Requests BOTTLES DRAWN AEROBIC AND ANAEROBIC 5CC   Final   Culture  Setup Time 12/02/2012 21:05   Final   Culture     Final   Value:        BLOOD CULTURE RECEIVED NO GROWTH TO DATE CULTURE WILL BE HELD FOR 5 DAYS BEFORE ISSUING A FINAL NEGATIVE REPORT   Report Status PENDING   Incomplete  CULTURE, BLOOD (ROUTINE X 2)     Status: None   Collection Time  12/02/12  1:25 PM      Result Value Range Status   Specimen Description BLOOD CENTRAL LINE   Final   Special Requests BOTTLES DRAWN AEROBIC ONLY 10 ML   Final   Culture  Setup Time 12/02/2012 21:05   Final   Culture     Final   Value:        BLOOD CULTURE RECEIVED NO GROWTH TO DATE CULTURE WILL BE HELD FOR 5 DAYS BEFORE ISSUING A FINAL NEGATIVE REPORT   Report Status PENDING   Incomplete  BODY FLUID CULTURE     Status: None   Collection Time    12/04/12 12:28 PM      Result Value Range Status   Specimen Description PLEURAL RIGHT   Final   Special Requests Normal   Final   Gram Stain     Final   Value: MODERATE WBC PRESENT,BOTH PMN AND MONONUCLEAR     NO ORGANISMS SEEN   Culture NO GROWTH 1 DAY   Final   Report Status PENDING   Incomplete      Studies/Results: No results found.  Medications:  Scheduled: . amLODipine  5 mg Oral Daily  . antiseptic oral rinse  15 mL Mouth Rinse QID  . chlorhexidine  15 mL Mouth Rinse BID  . furosemide  40 mg Oral Daily  . levofloxacin (LEVAQUIN) IV  500 mg Intravenous Q48H  . lipase/protease/amylase  1 capsule Oral TID WC  . pantoprazole (PROTONIX) IV  40 mg Intravenous Q12H  . sodium chloride  10-40 mL Intracatheter Q12H   Continuous: . dextrose 5 % and 0.45 % NaCl with KCl 40 mEq/L 50 mL/hr at 12/08/12 4098   JXB:JYNWGNFAO, fentaNYL, hydrALAZINE,  oxyCODONE-acetaminophen, sodium chloride, sodium chloride  Assessment/Plan:  Principal Problem:   Acute respiratory failure Active Problems:   Alcohol dependence   UGIB (upper gastrointestinal bleed)   Aspiration pneumonia    Aspiration Pneumonia/Pleural effusion/VDRF Patient extubated 7/3. Doing well. Had transudative pleural effusion. Is off O2. Continue Levaquin for now. Will change to oral soon. Needs total 10 days of treatment. IS.  Septic shock Was secondary to infection. Has resolved. Had central line which has been removed. Cortisol 10 6/29.  H/O Hypertension BP was elevated. Have restarted Amlodipine.   Acute Renal Failure Seems to be improving slowly. Had some degree of CKD at baseline. Continue to monitor. Avoid nephrotoxins   Hypokalemia Replete cautiously.  UGI bleed S/p EGD 6/29 -small mallory weiss tear w/ out active bleeding. Advance diet.  Anemia, acute blood los due to UGIB  Transfused PRBC while in ICU. Now stable. Monitor.  Altered mental status Was secondary to infection and medications. Now resolved.   Left humoral neck fracture  Seen by Ortho. Further management per Ortho. Pain control.   History of Chr Pancreatitis Stable  History of Alcohol dependence Stable.  Has been in rehab many times. Thiamine.  Code Status:  Full Code DVT Prophylaxis:   SCD's Family Communication: Discussed with patient.  Disposition Plan: Per PT/OT.    LOS: 8 days   [facility]  [facility] Pager [phone] 12/08/2012, 10:04 AM  If 8PM-8AM, please contact night-coverage at [email or web address], password [facility]

## 2012-12-09 DIAGNOSIS — F329 Major depressive disorder, single episode, unspecified: Secondary | ICD-10-CM

## 2012-12-09 LAB — COMPREHENSIVE METABOLIC PANEL
ALT: 14 U/L (ref 0–35)
AST: 27 U/L (ref 0–37)
CO2: 21 mEq/L (ref 19–32)
Chloride: 107 mEq/L (ref 96–112)
GFR calc non Af Amer: 27 mL/min — ABNORMAL LOW (ref >90)
Sodium: 138 mEq/L (ref 135–145)
Total Bilirubin: 0.2 mg/dL — ABNORMAL LOW (ref 0.3–1.2)

## 2012-12-09 LAB — CBC
MCV: 97 fL (ref 78.0–100.0)
Platelets: 266 10*3/uL (ref 150–400)
RBC: 2.69 MIL/uL — ABNORMAL LOW (ref 3.87–5.11)
WBC: 9.2 10*3/uL (ref 4.0–10.5)

## 2012-12-09 MED ORDER — LEVOFLOXACIN 500 MG PO TABS
500.0000 mg | ORAL_TABLET | ORAL | Status: DC
  Administered 2012-12-10: 500 mg via ORAL
  Filled 2012-12-09: qty 1

## 2012-12-09 NOTE — Treatment Plan (Signed)
Pt does not meet criteria for an inpatient detox or psych crisis stabiliztion.  If psych consult is desired please call assessment at [phone].

## 2012-12-09 NOTE — Progress Notes (Signed)
Physical Therapy Treatment Patient Details Name: [name] [name] MRN: [id number] DOB: 11-May-1956 Today's Date: 12/09/2012 Time: 4098-1191 PT Time Calculation (min): 24 min  PT Assessment / Plan / Recommendation  PT Comments   **Pt ambulated 130' pushing IV pole. Decreased step length bilaterally, wide BOS, decreased velocity. Pt fearful of falling, but no LOB while walking. Will try quad cane later today. Instructed pt in LE therapeutic exercises. She reports 4 falls in past year. She plans to DC to EtOH rehab. Will need to be independent with mobility to DC there. *  Follow Up Recommendations  Home health PT;Supervision for mobility/OOB (pt states she  wants to go home; may need SNF?)     Does the patient have the potential to tolerate intense rehabilitation     Barriers to Discharge        Equipment Recommendations  Cane    Recommendations for Other Services    Frequency Min 5X/week   Progress towards PT Goals Progress towards PT goals: Progressing toward goals  Plan Current plan remains appropriate    Precautions / Restrictions Precautions Precautions: Shoulder Shoulder Interventions: Shoulder sling/immobilizer Restrictions Weight Bearing Restrictions: No Other Position/Activity Restrictions: ?NWB RUE   Pertinent Vitals/Pain *0/10 pain**    Mobility  Bed Mobility Bed Mobility: Supine to Sit Supine to Sit: 4: Min assist Details for Bed Mobility Assistance: assist with scooting laterally and trunk to upright Transfers Transfers: Sit to Stand;Stand to Sit Sit to Stand: From bed;4: Min assist Stand to Sit: To chair/3-in-1;5: Supervision Details for Transfer Assistance: required assist with wt shift to stand and initial balance (mild posterior LOB); verbal cues for R hand placement, LUE in sling Ambulation/Gait Ambulation/Gait Assistance: 4: Min guard Ambulation Distance (Feet): 130 Feet Assistive device: Other (Comment) (IV pole) Gait Pattern: Step-to pattern;Decreased  step length - right;Decreased step length - left;Decreased hip/knee flexion - left;Decreased hip/knee flexion - right;Decreased trunk rotation;Wide base of support Gait velocity: decreased General Gait Details: guarded, encouraged increased step length, pt fearful of falling    Exercises General Exercises - Lower Extremity Ankle Circles/Pumps: AROM;10 reps;Both;Supine Quad Sets: AROM;Both;5 reps;Supine Gluteal Sets: AROM;Both;5 reps;Supine Heel Slides: AROM;Both;5 reps;Supine   PT Diagnosis:    PT Problem List:   PT Treatment Interventions:     PT Goals (current goals can now be found in the care plan section) Acute Rehab PT Goals Patient Stated Goal: she was in ETOH rehab before fall/[facility] adm, pt plans to return to EtOH rehab PT Goal Formulation: With patient Time For Goal Achievement: 12/22/12 Potential to Achieve Goals: Good  Visit Information  Last PT Received On: 12/09/12 Assistance Needed: +1 History of Present Illness: This is an unfortunate 21 AAF who is known to have some psychiatric problem and history of EtOH abuse who was in an inpatient rehab. Apparently yesterday she had a fall with resulting fracture of the left humeral neck. Today she woke up around 4AM, and was noted to be drowsy and not very responsive, which is unusual for her    Subjective Data  Patient Stated Goal: she was in ETOH rehab before fall/[facility] adm, pt plans to return to EtOH rehab   Cognition  Cognition Arousal/Alertness: Awake/alert Behavior During Therapy: WFL for tasks assessed/performed Overall Cognitive Status: Within Functional Limits for tasks assessed    Balance  Static Sitting Balance Static Sitting - Balance Support: Right upper extremity supported;Feet supported Static Sitting - Level of Assistance: 5: Stand by assistance Static Sitting - Comment/# of Minutes: 3  End of  Session PT - End of Session Equipment Utilized During Treatment: Gait belt;Other (comment) (shoulder  immobilizer) Activity Tolerance: Patient tolerated treatment well Patient left: in chair;with call bell/phone within reach Nurse Communication: Mobility status   GP     Ralene Bathe Kistler 12/09/2012, 9:41 AM [phone]

## 2012-12-09 NOTE — Evaluation (Signed)
Occupational Therapy Evaluation Patient Details Name: [name] [name] MRN: [id number] DOB: 04-27-1956 Today's Date: 12/09/2012 Time: 1324-4010 OT Time Calculation (min): 22 min  OT Assessment / Plan / Recommendation History of present illness This is an unfortunate 25 AAF who is known to have some psychiatric problem and history of EtOH abuse who was in an inpatient rehab. Apparently yesterday she had a fall with resulting fracture of the left humeral neck. Today she woke up around 4AM, and was noted to be drowsy and not very responsive, which is unusual for her   Clinical Impression   Pt is s/p fall with L UE fracture. She is requiring assist with ADL currently. Not sure if pt's family can provide 24/7 versus needing SNF. Nursing and MD aware. (? If Behavioral can take pt if requiring hand on assist)    OT Assessment  Patient needs continued OT Services    Follow Up Recommendations  Other (comment) (depends on if family can provide 24/7 versus SNF.  )    Barriers to Discharge      Equipment Recommendations  Other (comment) (TBA further)    Recommendations for Other Services    Frequency  Min 2X/week    Precautions / Restrictions Precautions Precautions: Shoulder Shoulder Interventions: Shoulder sling/immobilizer Precaution Comments: L UE Restrictions Weight Bearing Restrictions: No Other Position/Activity Restrictions: assuming NWB L UE   Pertinent Vitals/Pain No complaint of pain    ADL  Eating/Feeding: Simulated;Set up Where Assessed - Eating/Feeding: Chair Grooming: Performed;Wash/dry hands;Min guard Where Assessed - Grooming: Unsupported standing Upper Body Bathing: Simulated;Chest;Right arm;Left arm;Abdomen;Minimal assistance Where Assessed - Upper Body Bathing: Unsupported sitting Lower Body Bathing: Simulated;Minimal assistance Where Assessed - Lower Body Bathing: Supported sit to stand Upper Body Dressing: Simulated;+1 Total assistance (with sling) Where  Assessed - Upper Body Dressing: Unsupported sitting Lower Body Dressing: Simulated;Moderate assistance Where Assessed - Lower Body Dressing: Supported sit to Pharmacist, hospital: Performed;Minimal Dentist Method: Other (comment) (push IV pole into bathroom) Toilet Transfer Equipment: Comfort height toilet;Grab bars Toileting - Clothing Manipulation and Hygiene: Simulated;Minimal assistance Where Assessed - Engineer, mining and Hygiene: Sit to stand from 3-in-1 or toilet ADL Comments: Educated on proper positioning for L UE with using pillows, proper sling wear, need to move fingers frequently and call for assist.     OT Diagnosis: Generalized weakness  OT Problem List: Decreased strength;Decreased range of motion;Decreased knowledge of use of DME or AE OT Treatment Interventions: Self-care/ADL training;DME and/or AE instruction;Patient/family education;Therapeutic activities;Therapeutic exercise   OT Goals(Current goals can be found in the care plan section) Acute Rehab OT Goals Patient Stated Goal: pt didnt state but agreeable to work with OT OT Goal Formulation: With patient Time For Goal Achievement: 12/23/12 Potential to Achieve Goals: Good ADL Goals Pt Will Perform Grooming: with supervision;standing Pt Will Transfer to Toilet: with supervision;ambulating;regular height toilet Pt Will Perform Toileting - Clothing Manipulation and hygiene: with supervision;sit to/from stand Pt/caregiver will Perform Home Exercise Program: reft upper extremity;with Supervision (elbow, wrist and hand ROM L UE) Additional ADL Goal #1: Pt/family will be independent with sling and UE B/D management.   Visit Information  Last OT Received On: 12/09/12 Assistance Needed: +1 History of Present Illness: This is an unfortunate 77 AAF who is known to have some psychiatric problem and history of EtOH abuse who was in an inpatient rehab. Apparently yesterday she had a fall with  resulting fracture of the left humeral neck. Today she woke up around 4AM, and was  noted to be drowsy and not very responsive, which is unusual for her       Prior Functioning     Home Living Family/patient expects to be discharged to:: Private residence Living Arrangements: Spouse/significant other Type of Home: House Home Access: Stairs to enter Secretary/administrator of Steps: 5 Entrance Stairs-Rails: None Home Layout: One level Additional Comments: TBA for bathroom if plan is home Prior Function Level of Independence: Independent with assistive device(s) Communication Communication: No difficulties Dominant Hand: Right         Vision/Perception Vision - History Baseline Vision: No visual deficits   Cognition  Cognition Arousal/Alertness: Awake/alert Behavior During Therapy: WFL for tasks assessed/performed Overall Cognitive Status: Within Functional Limits for tasks assessed    Extremity/Trunk Assessment Upper Extremity Assessment Upper Extremity Assessment: LUE deficits/detail LUE Deficits / Details: Able to perform AROM elbow, wrist and digit flexion/extension LUE: Unable to fully assess due to immobilization     Mobility Bed Mobility Bed Mobility: Supine to Sit Supine to Sit: 4: Min assist Details for Bed Mobility Assistance: assist with scooting laterally and trunk to upright Transfers Transfers: Sit to Stand;Stand to Sit Sit to Stand: 4: Min assist;From toilet;With upper extremity assist;From chair/3-in-1 Stand to Sit: 4: Min guard;With upper extremity assist Details for Transfer Assistance: min assist required from low toilet surface to stand. with grab bar use.        Balance Static Sitting Balance Static Sitting - Balance Support: Right upper extremity supported;Feet supported Static Sitting - Level of Assistance: 5: Stand by assistance Static Sitting - Comment/# of Minutes: 3   End of Session OT - End of Session Activity Tolerance: Patient  tolerated treatment well Patient left: in chair;with call bell/phone within reach  GO     [name] [phone] 12/09/2012, 10:08 AM

## 2012-12-09 NOTE — Progress Notes (Addendum)
[facility] PROGRESS NOTE  [name] [name] ZHY:[id number] DOB: 23-Feb-1956 DOA: 11/30/2012  PCP: Aletta Edouard, MD  Brief HPI: 57 y/o female in rehab for EtOH problem + psych history, found to be drowsy, had bloody vomitus, likely aspirated, had fever + tachy + high WBC + hypoxia, AKI, likely severe sepsis due to asp pneumonia. Also had a fall 6/28 with left humeral fracture. She was intubated for respiratory failure and aspiration pneumonia. Admitted to ICU. She was also in septic shock and was noted to have GI bleeding. She underwent EGD. She had thoracentesis as well. She was subsequently extubated and transferred to floor.  Past medical history:  Past Medical History  Diagnosis Date  . Anemia, B12 deficiency   . History of acute pancreatitis   . Right knee pain     No recent imaging on chart  . 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 Dr. Su Hilt, [facility]  . Hypertriglyceridemia   . GERD (gastroesophageal reflux disease)   . Vitamin D deficiency   . Subdural hematoma 02/2008    Likely 2/2 trauma from seizure from EtOH withdrawal, chronic in nature, sees Dr. Robyne Askew. Most recent CT head 10/2009 showing stable but persistent hematoma without mass effect.  . History of seizure disorder     Likely 2/2 alcohol abuse  . Hypocalcemia   . Hypomagnesemia   . Failure to thrive in childhood     Unclear etiology  . HTN (hypertension)   . Thrombocytopenia   . Anemia, macrocytic   . Hepatomegaly     On exam  . Alcohol abuse   . Joint pain   . Alcohol abuse   . Arthritis   . Vitamin D deficiency   . Menopause   . Pancreatitis   . Insomnia   . Hyperlipidemia   . Sinusitis   . Pernicious anemia   . Subdural hematoma   . Macrocytic anemia   . Hepatomegaly   . 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 Seizures     1.5 YRS  LAST ONE  . Depression   . Fx humeral neck 04/17/2011    Transverse fracture- minimally  displaced- managed as outpatient   . ABNORMAL PAP SMEAR, LGSIL 07/23/2008    Annotation: HPV positive CIN I Dr. Su Hilt, [facility] Qualifier: Diagnosis of  By: Danae Chen    . Pneumonia 05/20/2012  . Hepatitis     HEPATOMEGALY     Consultants: PCCM,. [facility]  Procedures:  EGD 6/29 Small MWT  Central Line Left IJ 6/30 Pulled out 7/5  Thoracentesis 7/2 of clear pleural fluid. Was transudative.  Antibiotics: Levaquin to be stopped after 7/9 dose  Subjective: Patient continues to feel well. Denies shortness of breath. Pian in left shoulder is well controlled.   Objective: Vital Signs  Filed Vitals:   12/08/12 0500 12/08/12 1348 12/08/12 2202 12/09/12 0600  BP: 144/83 149/82 131/88 143/84  Pulse: 80 84 92 84  Temp: 98.7 F (37.1 C) 99.8 F (37.7 C) 99 F (37.2 C) 98.5 F (36.9 C)  TempSrc: Oral Oral Oral Oral  Resp: 18 18 18 18   Height:      Weight:      SpO2: 90% 90% 91% 91%    Intake/Output Summary (Last 24 hours) at 12/09/12 1002 Last data filed at 12/09/12 0751  Gross per 24 hour  Intake   1070 ml  Output  2000 ml  Net   -930 ml   Filed Weights   12/05/12 0400 12/06/12 0400 12/07/12 0500  Weight: 50.2 kg (110 lb 10.7 oz) 48.2 kg (106 lb 4.2 oz) 51.1 kg (112 lb 10.5 oz)    General appearance: alert, cooperative, appears stated age and no distress Resp: Few crackles at bases. No wheezing. Cardio: regular rate and rhythm, S1, S2 normal, no murmur, click, rub or gallop GI: soft, non-tender; bowel sounds normal; no masses,  no organomegaly Extremities: extremities normal, atraumatic, no cyanosis or edema Neurologic: Alert and oriented x 3. No focal deficits.  Lab Results:  Basic Metabolic Panel:  Recent Labs Lab 12/03/12 0530  12/04/12 0500 12/05/12 0454 12/05/12 1500 12/06/12 0500 12/07/12 0440 12/08/12 0512 12/09/12 0435  NA 140  < > 142 145 147* 146* 144 142 138  K 3.1*  < > 4.0 2.9* 2.7* 2.7* 3.1* 3.2* 3.7   CL 111  < > 112 114* 117* 115* 113* 111 107  CO2 18*  < > 20 22 21 22  18* 21 21  GLUCOSE 176*  < > 130* 128* 112* 90 73 114* 121*  BUN 28*  < > 29* 38* 42* 43* 40* 31* 24*  CREATININE 1.85*  < > 2.02* 2.33* 2.32* 2.33* 2.27* 2.09* 1.99*  CALCIUM 7.6*  < > 7.6* 7.3* 7.2* 7.1* 7.2* 7.1* 6.7*  MG 1.5  --  2.1 1.9  --  1.6 1.4*  --   --   PHOS 4.9*  --  5.6*  --   --   --  3.7  --   --   < > = values in this interval not displayed.  Liver Function Tests:  Recent Labs Lab 12/04/12 1230 12/09/12 0435  AST  --  27  ALT  --  14  ALKPHOS  --  166*  BILITOT  --  0.2*  PROT 6.2 6.1  ALBUMIN  --  1.6*   CBC:  Recent Labs Lab 12/06/12 0500 12/06/12 1755 12/07/12 0440 12/08/12 0512 12/09/12 0435  WBC 12.0* 12.3* 11.9* 8.3 9.2  HGB 6.0* 8.3* 8.0* 8.9* 8.8*  HCT 18.2* 24.1* 23.1* 26.2* 26.1*  MCV 99.5 95.6 96.3 96.7 97.0  PLT 212 236 241 256 266   BNP (last 3 results)  Recent Labs  08/07/12 2242  PROBNP 1210.0*   CBG:  Recent Labs Lab 12/07/12 0021 12/07/12 0450 12/07/12 0510 12/07/12 0808 12/07/12 1158  GLUCAP 79 69* 73 131* 100*    Recent Results (from the past 240 hour(s))  MRSA PCR SCREENING     Status: None   Collection Time    11/30/12 10:28 PM      Result Value Range Status   MRSA by PCR NEGATIVE  NEGATIVE Final   Comment:            The GeneXpert MRSA Assay (FDA     approved for NASAL specimens     only), is one component of a     comprehensive MRSA colonization     surveillance program. It is not     intended to diagnose MRSA     infection nor to guide or     monitor treatment for     MRSA infections.  CULTURE, RESPIRATORY (NON-EXPECTORATED)     Status: None   Collection Time    12/01/12  4:26 AM      Result Value Range Status   Specimen Description TRACHEAL ASPIRATE   Final   Special Requests Normal  Final   Gram Stain     Final   Value: ABUNDANT WBC PRESENT, PREDOMINANTLY PMN     RARE SQUAMOUS EPITHELIAL CELLS PRESENT     RARE GRAM  POSITIVE RODS   Culture     Final   Value: FEW STAPHYLOCOCCUS AUREUS     Note: RIFAMPIN AND GENTAMICIN SHOULD NOT BE USED AS SINGLE DRUGS FOR TREATMENT OF STAPH INFECTIONS.   Report Status 12/04/2012 FINAL   Final   Organism ID, Bacteria STAPHYLOCOCCUS AUREUS   Final  CULTURE, BLOOD (ROUTINE X 2)     Status: None   Collection Time    12/01/12  8:56 AM      Result Value Range Status   Specimen Description BLOOD LEFT ARM   Final   Special Requests BOTTLES DRAWN AEROBIC AND ANAEROBIC 3.5 CC EA   Final   Culture  Setup Time 12/01/2012 19:02   Final   Culture NO GROWTH 5 DAYS   Final   Report Status 12/08/2012 FINAL   Final  CULTURE, BLOOD (ROUTINE X 2)     Status: None   Collection Time    12/01/12  9:03 AM      Result Value Range Status   Specimen Description BLOOD LEFT ARM   Final   Special Requests BOTTLES DRAWN AEROBIC AND ANAEROBIC 4.5 CC EA   Final   Culture  Setup Time 12/01/2012 19:02   Final   Culture NO GROWTH 5 DAYS   Final   Report Status 12/08/2012 FINAL   Final  URINE CULTURE     Status: None   Collection Time    12/02/12 10:19 AM      Result Value Range Status   Specimen Description URINE, CATHETERIZED   Final   Special Requests NONE   Final   Culture  Setup Time 12/02/2012 23:00   Final   Colony Count NO GROWTH   Final   Culture NO GROWTH   Final   Report Status 12/03/2012 FINAL   Final  CULTURE, RESPIRATORY (NON-EXPECTORATED)     Status: None   Collection Time    12/02/12 11:55 AM      Result Value Range Status   Specimen Description SPUTUM   Final   Special Requests NONE   Final   Gram Stain     Final   Value: MODERATE WBC PRESENT, PREDOMINANTLY PMN     NO SQUAMOUS EPITHELIAL CELLS SEEN     NO ORGANISMS SEEN   Culture NO GROWTH 2 DAYS   Final   Report Status 12/04/2012 FINAL   Final  CULTURE, BLOOD (ROUTINE X 2)     Status: None   Collection Time    12/02/12 12:20 PM      Result Value Range Status   Specimen Description BLOOD RIGHT FOOT   Final    Special Requests BOTTLES DRAWN AEROBIC AND ANAEROBIC 5CC   Final   Culture  Setup Time 12/02/2012 21:05   Final   Culture NO GROWTH 5 DAYS   Final   Report Status 12/08/2012 FINAL   Final  CULTURE, BLOOD (ROUTINE X 2)     Status: None   Collection Time    12/02/12  1:25 PM      Result Value Range Status   Specimen Description BLOOD CENTRAL LINE   Final   Special Requests BOTTLES DRAWN AEROBIC ONLY 10 ML   Final   Culture  Setup Time 12/02/2012 21:05   Final   Culture NO GROWTH 5 DAYS  Final   Report Status 12/08/2012 FINAL   Final  BODY FLUID CULTURE     Status: None   Collection Time    12/04/12 12:28 PM      Result Value Range Status   Specimen Description PLEURAL RIGHT   Final   Special Requests Normal   Final   Gram Stain     Final   Value: MODERATE WBC PRESENT,BOTH PMN AND MONONUCLEAR     NO ORGANISMS SEEN   Culture NO GROWTH 3 DAYS   Final   Report Status 12/08/2012 FINAL   Final      Studies/Results: No results found.  Medications:  Scheduled: . amLODipine  5 mg Oral Daily  . antiseptic oral rinse  15 mL Mouth Rinse QID  . chlorhexidine  15 mL Mouth Rinse BID  . furosemide  40 mg Oral Daily  . levofloxacin (LEVAQUIN) IV  500 mg Intravenous Q48H  . lipase/protease/amylase  1 capsule Oral TID WC  . pantoprazole  40 mg Oral Q1200  . sodium chloride  10-40 mL Intracatheter Q12H  . thiamine  100 mg Oral Daily   Continuous: . dextrose 5 % and 0.45 % NaCl with KCl 40 mEq/L 50 mL/hr at 12/09/12 1610   RUE:[id number], fentaNYL, hydrALAZINE, oxyCODONE-acetaminophen, sodium chloride, sodium chloride  Assessment/Plan:  Principal Problem:   Acute respiratory failure Active Problems:   Alcohol dependence   UGIB (upper gastrointestinal bleed)   Aspiration pneumonia   ARF (acute renal failure)   Hypokalemia   Acute blood loss anemia    Aspiration Pneumonia/Pleural effusion/VDRF Patient extubated 7/3. Doing well. Had transudative pleural effusion. Is off O2.  Continue Levaquin for now but change to oral. Needs total 10 days of treatment. IS.  Septic shock Was secondary to infection. Has resolved. Had central line which has been removed. Cortisol 10 6/29.  H/O Hypertension BP was elevated. Have restarted Amlodipine.   Acute Renal Failure Seems to be improving slowly. Had some degree of CKD at baseline. Continue to monitor. Avoid nephrotoxins   Hypokalemia Replete cautiously.  UGI bleed S/p EGD 6/29 -small mallory weiss tear w/ out active bleeding. Advance diet.  Anemia, acute blood los due to UGIB  Transfused PRBC while in ICU. Now stable. Monitor.  Altered mental status Was secondary to infection and medications. Now resolved.   Left humoral neck fracture  Seen by Ortho. Further management per Ortho. Pain control. Ortho to see in office in 2 weeks. See their note for further details.  History of Chr Pancreatitis Stable  History of Alcohol dependence Stable.  Has been in rehab many times. Thiamine.  History of Depression Was in [facility] rehab prior to this admission. Will get psyche consult to help with disposition. Not actively suicidal.  Code Status:  Full Code DVT Prophylaxis:   SCD's Family Communication: Discussed with patient.  Disposition Plan: Will get [facility] consult to se if she needs inpatient psyche care. If not, either home with [facility] or SNF. Anticipate discharge in AM. Patient states she has help at home.    LOS: 9 days   [facility]  [facility] Pager [phone] 12/09/2012, 10:02 AM  If 8PM-8AM, please contact night-coverage at [email or web address], password [facility]

## 2012-12-09 NOTE — Progress Notes (Addendum)
Physical Therapy Treatment Patient Details Name: [name] [name] MRN: [id number] DOB: [birth year] Today's Date: 12/09/2012 Time: 1191-4782 PT Time Calculation (min): 18 min  PT Assessment / Plan / Recommendation  PT Comments   *Pt ambulated 130' with a quad cane without loss of balance. OK to DC to EtOH rehab from PT standpoint. Quad cane and 3 in 1 recommended. **  Follow Up Recommendations  Home health PT;     Does the patient have the potential to tolerate intense rehabilitation     Barriers to Discharge        Equipment Recommendations  Cane;3in1 (PT) (Small based [employer])    Recommendations for Other Services    Frequency Min 5X/week   Progress towards PT Goals Progress towards PT goals: Progressing toward goals  Plan Current plan remains appropriate    Precautions / Restrictions Precautions Precautions: Shoulder Shoulder Interventions: Shoulder sling/immobilizer Precaution Comments: L UE Restrictions Other Position/Activity Restrictions: assuming NWB L UE   Pertinent Vitals/Pain **no c/o pain*    Mobility  Bed Mobility Supine to Sit: 6: Modified independent (Device/Increase time);With rails Transfers Transfers: Sit to Stand;Stand to Sit Sit to Stand: With upper extremity assist;5: Supervision;From bed;From toilet;With armrests;4: Min assist Stand to Sit: With upper extremity assist;To toilet;5: Supervision;To bed Details for Transfer Assistance: Supervision from bed, min assist required from low toilet surface to stand. with grab bar use. Ambulation/Gait Ambulation/Gait Assistance: 5: Supervision Ambulation Distance (Feet): 130 Feet Assistive device: Small based quad cane Gait Pattern: Step-through pattern;Decreased step length - left;Decreased step length - right Gait velocity: decreased General Gait Details: steady with quad cane, no LOB    Exercises General Exercises - Lower Extremity Ankle Circles/Pumps: AROM;10 reps;Both;Supine Quad Sets: AROM;Both;5  reps;Supine Gluteal Sets: AROM;Both;5 reps;Supine Heel Slides: AROM;Both;5 reps;Supine   PT Diagnosis:    PT Problem List:   PT Treatment Interventions:     PT Goals (current goals can now be found in the care plan section) Acute Rehab PT Goals Patient Stated Goal: return to EtOH rehab PT Goal Formulation: With patient Time For Goal Achievement: 12/22/12 Potential to Achieve Goals: Good  Visit Information  Last PT Received On: 12/09/12 Assistance Needed: +1 History of Present Illness: This is an unfortunate 70 AAF who is known to have some psychiatric problem and history of EtOH abuse who was in an inpatient rehab. Apparently yesterday she had a fall with resulting fracture of the left humeral neck. Today she woke up around 4AM, and was noted to be drowsy and not very responsive, which is unusual for her    Subjective Data  Patient Stated Goal: return to EtOH rehab   Cognition  Cognition Arousal/Alertness: Awake/alert Behavior During Therapy: WFL for tasks assessed/performed Overall Cognitive Status: Within Functional Limits for tasks assessed    Balance  Static Sitting Balance Static Sitting - Balance Support: Right upper extremity supported;Feet supported Static Sitting - Level of Assistance: 5: Stand by assistance Static Sitting - Comment/# of Minutes: 3  End of Session PT - End of Session Equipment Utilized During Treatment: Gait belt;Other (comment) (shoulder immobilizer) Activity Tolerance: Patient tolerated treatment well Patient left: with call bell/phone within reach;in bed Nurse Communication: Mobility status   GP     Ralene Bathe Kistler 12/09/2012, 1:37 PM [phone]

## 2012-12-10 LAB — BASIC METABOLIC PANEL
BUN: 22 mg/dL (ref 6–23)
Calcium: 6.3 mg/dL — CL (ref 8.4–10.5)
Creatinine, Ser: 1.99 mg/dL — ABNORMAL HIGH (ref 0.50–1.10)
GFR calc Af Amer: 31 mL/min — ABNORMAL LOW (ref >90)
GFR calc non Af Amer: 27 mL/min — ABNORMAL LOW (ref >90)

## 2012-12-10 MED ORDER — LEVOFLOXACIN 500 MG PO TABS: 500.0000 mg | ORAL_TABLET | ORAL | Status: DC

## 2012-12-10 MED ORDER — ENSURE COMPLETE PO LIQD
237.0000 mL | Freq: Three times a day (TID) | ORAL | Status: DC
  Administered 2012-12-10: 237 mL via ORAL

## 2012-12-10 MED ORDER — OXYCODONE-ACETAMINOPHEN 5-325 MG PO TABS: 1.0000 | ORAL_TABLET | Freq: Four times a day (QID) | ORAL | Status: DC | PRN

## 2012-12-10 NOTE — Care Management Note (Signed)
    Page 1 of 2   12/10/2012     5:56:05 PM   CARE MANAGEMENT NOTE 12/10/2012  Patient:  [name] [name], [name] [name]   Account Number:  [id number]  Date Initiated:  12/01/2012  Documentation initiated by:  [facility]  Subjective/Objective Assessment:   57 year old female admitted with aspiration PNA.     Action/Plan:   Admitted from rehab facility.   Anticipated DC Date:  12/07/2012   Anticipated DC Plan:  SKILLED NURSING FACILITY  In-house referral  Clinical Social Worker      DC Associate Professor  CM consult      [facility] Choice  HOME HEALTH  DURABLE MEDICAL EQUIPMENT   Choice offered to / List presented to:  C-1 Patient   DME arranged  3-N-1  CANE      DME agency  [facility]     HH arranged  HH-1 RN  HH-2 PT  HH-3 OT      [facility] agency  [facility]   Status of service:  Completed, signed off Medicare Important Message given?  NA - LOS <3 / Initial given by admissions (If response is "NO", the following Medicare IM given date fields will be blank) Date Medicare IM given:   Date Additional Medicare IM given:    Discharge Disposition:  HOME W HOME HEALTH SERVICES  Per UR Regulation:  Reviewed for med. necessity/level of care/duration of stay  If discussed at Long Length of Stay Meetings, dates discussed:    Comments:  12/10/2012 Colleen Can BSN RN CCM [phone] CM spoke with patient. Plans are for her to return to her home in [place] where daughter will be caregiver. She is requesting [facility] for [facility] services. [facility] states they can provide care with start date of tomorrow. Dme has been deliveredto pt's room.    [id number] Earlene Plater, RN, BSN, CCM:  CHART REVIEWED AND UPDATED.  Next chart review due on [id number]. NO DISCHARGE NEEDS PRESENT AT THIS TIME. CASE MANAGEMENT [phone]

## 2012-12-10 NOTE — Plan of Care (Signed)
Problem: Acute Rehab OT Goals Goal: Pt. Will Transfer To Toilet Addendum added 3in1 on 12/10/12

## 2012-12-10 NOTE — Progress Notes (Signed)
Discharge summary sent to payer through [facility]  

## 2012-12-10 NOTE — Progress Notes (Signed)
Clinical Social Work Department CLINICAL SOCIAL WORK PSYCHIATRY SERVICE LINE ASSESSMENT 12/10/2012  Patient:  [name] [name]  Account:  [id number]  Admit Date:  11/30/2012  Clinical Social Worker:  Unk Lightning, LCSW  Date/Time:  12/10/2012 11:20 AM Referred by:  Physician  Date referred:  12/10/2012 Reason for Referral  Psychosocial assessment   Presenting Symptoms/Problems (In the person's/family's own words):   Psych consulted for substance abuse.   Abuse/Neglect/Trauma History (check all that apply)  Denies history   Abuse/Neglect/Trauma Comments:   Psychiatric History (check all that apply)  Outpatient treatment   Psychiatric medications:  Prozac 10 mg   Current Mental Health Hospitalizations/Previous Mental Health History:   Patient receives medication management and intensive outpatient therapy substance abuse treatment as well.   Current provider:   [facility]  Dr. Pavelock-psychiatrist  [facility] counselor   Place and Date:   [place], [place]   Current Medications:   albuterol, fentaNYL, hydrALAZINE, oxyCODONE-acetaminophen, sodium chloride            . amLODipine  5 mg Oral Daily  . antiseptic oral rinse  15 mL Mouth Rinse QID  . chlorhexidine  15 mL Mouth Rinse BID  . feeding supplement  237 mL Oral TID BM  . furosemide  40 mg Oral Daily  . levofloxacin  500 mg Oral Q48H  . lipase/protease/amylase  1 capsule Oral TID WC  . pantoprazole  40 mg Oral Q1200  . thiamine  100 mg Oral Daily   Previous Impatient Admission/Date/Reason:   Patient was at [facility] for detox prior to admission.   Emotional Health / Current Symptoms    Suicide/Self Harm  None reported   Suicide attempt in the past:   Patient denies any current SI or HI. Patient denies any previous suicide attempts.   Other harmful behavior:   None reported   Psychotic/Dissociative Symptoms  None reported   Other Psychotic/Dissociative Symptoms:   Patient denies any  psychotic symptoms.    Attention/Behavioral Symptoms  Within Normal Limits   Other Attention / Behavioral Symptoms:   Patient engaged and appropriate throughout assessment.    Cognitive Impairment  Within Normal Limits   Other Cognitive Impairment:    Mood and Adjustment  Flat    Stress, Anxiety, Trauma, Any Recent Loss/Stressor  None reported   Anxiety (frequency):   N/A   Phobia (specify):   N/A   Compulsive behavior (specify):   N/A   Obsessive behavior (specify):   N/A   Other:   N/A   Substance Abuse/Use  Substance abuse treatment needed   SBIRT completed (please refer for detailed history):  Y  Self-reported substance use:   Patient reports she went to [facility] to detox from alcohol. Patient completed SBIRT and agreeable to treatment. Patient reports that she is attending [facility] currently and will continue with that treatment at [facility]. Patient declines any inpatient treatment.   Urinary Drug Screen Completed:  Y Alcohol level:   <11    Environmental/Housing/Living Arrangement  Stable housing   Who is in the home:   Family   Emergency contact:  Lawrence-spouse   Financial  Medicare  Medicaid   Patient's Strengths and Goals (patient's own words):   Patient is active in treatment and agreeable to continue treatment to remain sober.   Clinical Social Worker's Interpretive Summary:   CSW received referral to complete psychosocial assessment. CSW reviewed chart and met with patient at bedside. Patient laying in bed and watching TV when CSW arrived.  Patient agreeable to assessment. CSW introduced myself and explained role.    Patient reports that she decided she needed to quit drinking and was encouraged to complete detox at [facility]. Patient reports she was there for a few days and then had to be admitted to the hospital. Patient reports that she has been at [facility] or in the hospital for the past 15 days and is ready to [place].    Patient reports that she has been  drinking alcohol but denies any further substance use. Patient completed SBIRT and aware that treatment is needed in order to remain sober. CSW educated patient on different levels of treatment including inpatient treatment, [facility], outpatient treatment, or AA meetings. Patient is already active with [facility] and will continue with [facility] at [facility].    Patient reports that she started going to [facility] in May and was seeing a psychiatrist for depression as well. Patient was prescribed an antidepressant and reports that her depression is improving. Patient denies any SI or HI and feels that if she can remain sober then it will also help with her depression.    CSW called [facility] and left a message with [facility] in order to schedule a follow up appointment for patient. CSW will continue to follow and will assist with any further recommendations provided by psych MD.   Disposition:  Outpatient referral made/needed

## 2012-12-10 NOTE — Progress Notes (Signed)
NUTRITION FOLLOW UP  Intervention:   - Ensure Complete TID - Encouraged pt to continue to consume >50% of meals - Will continue to monitor   Nutrition Dx:   Inadequate oral intake related to inability to eat as evidenced by npo status - no longer appropriate, diet advanced  New nutrition dx: Unintended weight loss related to alcohol abuse, not eating well PTA as evidenced by pt statement.   Goal:   Meet >90% estimated needs with enteral nutrition - not met, pt not on TF  New goal: 1. Pt to consume >90% of meals/supplements 2. Stable weight   Monitor:   Weights, labs, intake  Assessment:   S/p extubation 7/3. Diet advanced to full liquids 7/5. Renal diet started 7/6. Pt's weight up 18 pounds since admission - noted pt with +1 generalized and non-pitting LUE edema.   Met with pt who reports she has been eating at least 50% of her meals, ate 100% of her breakfast this morning. Pt states PTA she lost 25 pounds unintentionally in the past 1.5 months from drinking and not eating well. RD on admission, who was familiar with pt from previous admissions, documented pt with severe malnutrition from social/environmental causes as evidenced by 31% weight loss in the past year with a decrease of body fat and muscle mass.   Noted pt with elevated serum Cr however trending down, GFR low but improving, Alk phos elevated.   Height: Ht Readings from Last 1 Encounters:  11/30/12 5\' 3"  (1.6 m)    Weight Status:   Wt Readings from Last 1 Encounters:  12/07/12 112 lb 10.5 oz (51.1 kg)    Re-estimated needs:  Kcal: 1300-1550 Protein: 61-77g Fluid: 1.3-1.5L/day  Skin: +1 generalized edema, LUE edema, left neck incision    Diet Order: Renal   Intake/Output Summary (Last 24 hours) at 12/10/12 0944 Last data filed at 12/10/12 0602  Gross per 24 hour  Intake   2590 ml  Output   1700 ml  Net    890 ml    Last BM: 7/6   Labs:   Recent Labs Lab 12/04/12 0500 12/05/12 0454   12/06/12 0500 12/07/12 0440 12/08/12 0512 12/09/12 0435 12/10/12 0438  NA 142 145  < > 146* 144 142 138 136  K 4.0 2.9*  < > 2.7* 3.1* 3.2* 3.7 4.6  CL 112 114*  < > 115* 113* 111 107 107  CO2 20 22  < > 22 18* 21 21 18*  BUN 29* 38*  < > 43* 40* 31* 24* 22  CREATININE 2.02* 2.33*  < > 2.33* 2.27* 2.09* 1.99* 1.99*  CALCIUM 7.6* 7.3*  < > 7.1* 7.2* 7.1* 6.7* 6.3*  MG 2.1 1.9  --  1.6 1.4*  --   --   --   PHOS 5.6*  --   --   --  3.7  --   --   --   GLUCOSE 130* 128*  < > 90 73 114* 121* 100*  < > = values in this interval not displayed.  CBG (last 3)   Recent Labs  12/07/12 1158  GLUCAP 100*    Scheduled Meds: . amLODipine  5 mg Oral Daily  . antiseptic oral rinse  15 mL Mouth Rinse QID  . chlorhexidine  15 mL Mouth Rinse BID  . furosemide  40 mg Oral Daily  . levofloxacin  500 mg Oral Q48H  . lipase/protease/amylase  1 capsule Oral TID WC  . pantoprazole  40  mg Oral Q1200  . thiamine  100 mg Oral Daily    Continuous Infusions: . dextrose 5 % and 0.45 % NaCl with KCl 40 mEq/L 50 mL/hr at 12/10/12 0855     Levon Hedger MS, RD, LDN [phone] Pager [phone] After Hours Pager

## 2012-12-10 NOTE — Progress Notes (Signed)
12/10/12 1610 Nursing Dr. Clearence Ped called reg: calcium level 6.3 this am. No orders received.

## 2012-12-10 NOTE — Discharge Summary (Signed)
[facility]  Physician Discharge Summary   [name] MRN: [id number] DOB/AGE: 1956-01-24 57 y.o.  Admit date: 11/30/2012 Discharge date: 12/10/2012  PCP: Genelle Gather, MD  DISCHARGE DIAGNOSES:  Active Problems:   Alcohol dependence   UGIB (upper gastrointestinal bleed)   Aspiration pneumonia   ARF (acute renal failure)   Hypokalemia   Acute blood loss anemia   RECOMMENDATIONS FOR OUTPATIENT FOLLOW UP: 1. Patient needs to follow up with Dr. August Saucer (ortho) for humerus fracture.  DISCHARGE CONDITION: fair  Diet recommendation: Low Sodium/Low Fat  Filed Weights   12/05/12 0400 12/06/12 0400 12/07/12 0500  Weight: 50.2 kg (110 lb 10.7 oz) 48.2 kg (106 lb 4.2 oz) 51.1 kg (112 lb 10.5 oz)    INITIAL HISTORY: 57 y/o female in rehab for EtOH problem + psych history, found to be drowsy, had bloody vomitus, likely aspirated, had fever + tachy + high WBC + hypoxia, AKI, likely severe sepsis due to asp pneumonia. Also had a fall 6/28 with left humeral fracture. She was intubated for respiratory failure and aspiration pneumonia. Admitted to ICU. She was also in septic shock and was noted to have GI bleeding. She underwent EGD. She had thoracentesis as well. She was subsequently extubated and transferred to floor.   Consultations:  Ortho (Dr. August Saucer)  Critical Care medicine  [facility]  Procedures: EGD 6/29  Small Mallory Weiss Tear   Central Line Left IJ 6/30  Pulled out 7/5   Thoracentesis 7/2  of clear pleural fluid. Was transudative.  HOSPITAL COURSE:   Aspiration Pneumonia/Pleural effusion/VDRF  After several days on the vent patient was extubated on 7/3. She is doing well. She has a transudative pleural effusion which was tapped. She is now off O2. Continue Levaquin for 3 more days.   Septic shock  Was secondary to infection. Has resolved. Had central line which has been removed. Cortisol 10 6/29.   H/O Hypertension  BP was elevated. Have  restarted Amlodipine.   Acute Renal Failure Seems to be improving slowly. Had some degree of CKD at baseline. Continue to monitor. Avoid nephrotoxins. Continue Lasix as before.  Hypokalemia  Repleted  UGI bleed  S/p EGD 6/29 -small mallory weiss tear w/ out active bleeding. Hgb did drop some and she was transfused. Continue PPI. She has been asked to avoid alcohol use.  Anemia, acute blood los due to UGIB  Transfused PRBC while in ICU. Now stable.   Altered mental status  Was secondary to infection and medications. Now resolved.   Left humoral neck fracture  Secondary to fall at detox/rehab center. Seen by Ortho and they recommend sling for 2 weeks with follow up in their office.    History of Chr Pancreatitis  Stable   History of Alcohol dependence  Stable. Has been in rehab many times. Thiamine. Counseled extensively.  History of Depression  Not actively suicidal. Continue home medications. Not a candidate for inpatient treatment.  Overall she is stable. She will be discharged home with home health. CSW has seen and patient has MH resources available.  PERTINENT LABS:  The results of significant diagnostics from this hospitalization (including imaging, microbiology, ancillary and laboratory) are listed below for reference.    Microbiology: Recent Results (from the past 240 hour(s))  MRSA PCR SCREENING     Status: None   Collection Time    11/30/12 10:28 PM      Result Value Range Status   MRSA by PCR NEGATIVE  NEGATIVE Final   Comment:            The GeneXpert MRSA Assay (FDA     approved for NASAL specimens     only), is one component of a     comprehensive MRSA colonization     surveillance program. It is not     intended to diagnose MRSA     infection nor to guide or     monitor treatment for     MRSA infections.  CULTURE, RESPIRATORY (NON-EXPECTORATED)     Status: None   Collection Time    12/01/12  4:26 AM      Result Value Range Status   Specimen  Description TRACHEAL ASPIRATE   Final   Special Requests Normal   Final   Gram Stain     Final   Value: ABUNDANT WBC PRESENT, PREDOMINANTLY PMN     RARE SQUAMOUS EPITHELIAL CELLS PRESENT     RARE GRAM POSITIVE RODS   Culture     Final   Value: FEW STAPHYLOCOCCUS AUREUS     Note: RIFAMPIN AND GENTAMICIN SHOULD NOT BE USED AS SINGLE DRUGS FOR TREATMENT OF STAPH INFECTIONS.   Report Status 12/04/2012 FINAL   Final   Organism ID, Bacteria STAPHYLOCOCCUS AUREUS   Final  CULTURE, BLOOD (ROUTINE X 2)     Status: None   Collection Time    12/01/12  8:56 AM      Result Value Range Status   Specimen Description BLOOD LEFT ARM   Final   Special Requests BOTTLES DRAWN AEROBIC AND ANAEROBIC 3.5 CC EA   Final   Culture  Setup Time 12/01/2012 19:02   Final   Culture NO GROWTH 5 DAYS   Final   Report Status 12/08/2012 FINAL   Final  CULTURE, BLOOD (ROUTINE X 2)     Status: None   Collection Time    12/01/12  9:03 AM      Result Value Range Status   Specimen Description BLOOD LEFT ARM   Final   Special Requests BOTTLES DRAWN AEROBIC AND ANAEROBIC 4.5 CC EA   Final   Culture  Setup Time 12/01/2012 19:02   Final   Culture NO GROWTH 5 DAYS   Final   Report Status 12/08/2012 FINAL   Final  URINE CULTURE     Status: None   Collection Time    12/02/12 10:19 AM      Result Value Range Status   Specimen Description URINE, CATHETERIZED   Final   Special Requests NONE   Final   Culture  Setup Time 12/02/2012 23:00   Final   Colony Count NO GROWTH   Final   Culture NO GROWTH   Final   Report Status 12/03/2012 FINAL   Final  CULTURE, RESPIRATORY (NON-EXPECTORATED)     Status: None   Collection Time    12/02/12 11:55 AM      Result Value Range Status   Specimen Description SPUTUM   Final   Special Requests NONE   Final   Gram Stain     Final   Value: MODERATE WBC PRESENT, PREDOMINANTLY PMN     NO SQUAMOUS EPITHELIAL CELLS SEEN     NO ORGANISMS SEEN   Culture NO GROWTH 2 DAYS   Final   Report  Status 12/04/2012 FINAL   Final  CULTURE, BLOOD (ROUTINE X 2)     Status: None   Collection Time    12/02/12 12:20 PM  Result Value Range Status   Specimen Description BLOOD RIGHT FOOT   Final   Special Requests BOTTLES DRAWN AEROBIC AND ANAEROBIC 5CC   Final   Culture  Setup Time 12/02/2012 21:05   Final   Culture NO GROWTH 5 DAYS   Final   Report Status 12/08/2012 FINAL   Final  CULTURE, BLOOD (ROUTINE X 2)     Status: None   Collection Time    12/02/12  1:25 PM      Result Value Range Status   Specimen Description BLOOD CENTRAL LINE   Final   Special Requests BOTTLES DRAWN AEROBIC ONLY 10 ML   Final   Culture  Setup Time 12/02/2012 21:05   Final   Culture NO GROWTH 5 DAYS   Final   Report Status 12/08/2012 FINAL   Final  BODY FLUID CULTURE     Status: None   Collection Time    12/04/12 12:28 PM      Result Value Range Status   Specimen Description PLEURAL RIGHT   Final   Special Requests Normal   Final   Gram Stain     Final   Value: MODERATE WBC PRESENT,BOTH PMN AND MONONUCLEAR     NO ORGANISMS SEEN   Culture NO GROWTH 3 DAYS   Final   Report Status 12/08/2012 FINAL   Final     Labs: Basic Metabolic Panel:  Recent Labs Lab 12/04/12 0500 12/05/12 0454  12/06/12 0500 12/07/12 0440 12/08/12 0512 12/09/12 0435 12/10/12 0438  NA 142 145  < > 146* 144 142 138 136  K 4.0 2.9*  < > 2.7* 3.1* 3.2* 3.7 4.6  CL 112 114*  < > 115* 113* 111 107 107  CO2 20 22  < > 22 18* 21 21 18*  GLUCOSE 130* 128*  < > 90 73 114* 121* 100*  BUN 29* 38*  < > 43* 40* 31* 24* 22  CREATININE 2.02* 2.33*  < > 2.33* 2.27* 2.09* 1.99* 1.99*  CALCIUM 7.6* 7.3*  < > 7.1* 7.2* 7.1* 6.7* 6.3*  MG 2.1 1.9  --  1.6 1.4*  --   --   --   PHOS 5.6*  --   --   --  3.7  --   --   --   < > = values in this interval not displayed. Liver Function Tests:  Recent Labs Lab 12/04/12 1230 12/09/12 0435  AST  --  27  ALT  --  14  ALKPHOS  --  166*  BILITOT  --  0.2*  PROT 6.2 6.1  ALBUMIN  --   1.6*   CBC:  Recent Labs Lab 12/06/12 0500 12/06/12 1755 12/07/12 0440 12/08/12 0512 12/09/12 0435  WBC 12.0* 12.3* 11.9* 8.3 9.2  HGB 6.0* 8.3* 8.0* 8.9* 8.8*  HCT 18.2* 24.1* 23.1* 26.2* 26.1*  MCV 99.5 95.6 96.3 96.7 97.0  PLT 212 236 241 256 266   BNP: BNP (last 3 results)  Recent Labs  08/07/12 2242  PROBNP 1210.0*   CBG:  Recent Labs Lab 12/07/12 0021 12/07/12 0450 12/07/12 0510 12/07/12 0808 12/07/12 1158  GLUCAP 79 69* 73 131* 100*     IMAGING STUDIES Dg Chest 1 View  11/30/2012   *RADIOLOGY REPORT*  Clinical Data: Post intubation  CHEST - 1 VIEW  Comparison: Earlier same day  Findings:  Grossly unchanged cardiac silhouette and mediastinal contours. Interval intubation with endotracheal tube overlying the tracheal air column with tip approximately 4.4 cm above  the carina.  Enteric tube tip and side port project over the expected location of the gastric fundus.  Minimally improved aeration of the lungs with persistent right mid and left lower heterogeneous air space opacities.  No new focal airspace opacities.  No pleural effusion or pneumothorax.  No definite evidence of edema.  Unchanged bones.  IMPRESSION: 1.  Interval intubation with endotracheal tube overlying tracheal air column with tip approximately 4.4 cm above the carina.  No pneumothorax. 2.  Improved aeration of the lungs with persistent right mid and left lower lung airspace opacities worrisome for multifocal infection.   Original Report Authenticated By: Tacey Ruiz, MD   Ct Head Wo Contrast  11/30/2012   *RADIOLOGY REPORT*  Clinical Data: Altered mental status.  Unresponsive.  CT HEAD WITHOUT CONTRAST  Technique:  Contiguous axial images were obtained from the base of the skull through the vertex without contrast.  Comparison: 09/06/2011  Findings: The brain shows generalized atrophy.  There is no evidence of old or acute focal infarction, mass lesion, hemorrhage, hydrocephalus or extra-axial  collection.  The calvarium is unremarkable.  Sinuses, middle ears and mastoids are clear.  IMPRESSION: Atrophy.  No focal or acute finding.   Original Report Authenticated By: Paulina Fusi, M.D.   Dg Chest Port 1 View  12/06/2012   *RADIOLOGY REPORT*  Clinical Data: Evaluate lung fields, atelectasis  PORTABLE CHEST - 1 VIEW  Comparison: Prior chest x-ray 12/05/2012  Findings: The patient has been extubated and the nasogastric tube removed.  Stable position of left IJ approach central venous catheter with the tip in the mid superior vena cava directed toward the lateral wall of the cava.  Stable enlargement the cardiopericardial silhouette.  Inspiratory volumes are slightly improved.  There is persistent pulmonary edema, bilateral layering effusions, bibasilar atelectasis and atelectasis versus infiltrate in the inferior right upper lobe.  IMPRESSION:  1.  Interval extubation and removal of nasogastric tube. 2.  Slightly improved inspiratory volumes with persistent bilateral layering effusions, pulmonary edema, and bibasilar atelectasis. 3.  Developing linear opacity in the inferior right upper lobe may reflect additional atelectasis or infiltrate.   Original Report Authenticated By: Malachy Moan, M.D.   Dg Chest Port 1 View  12/05/2012   *RADIOLOGY REPORT*  Clinical Data: Evaluate infiltrates.  PORTABLE CHEST - 1 VIEW  Comparison: Chest x-ray 12/04/2012.  Findings: An endotracheal tube is in place with tip 4.1 cm above the carina. There is a left-sided internal jugular central venous catheter with tip terminating in the mid superior vena cava. A nasogastric tube is seen extending into the stomach, however, the tip of the nasogastric tube extends below the lower margin of the image.  Lung volumes remain low.  Extensive bibasilar opacities (increasing on the right), compatible with worsening areas of atelectasis and/or consolidation.  Probable small bilateral pleural effusions (left greater than right).  The  pulmonary venous congestion, without frank pulmonary edema.  Heart size is within normal limits.  Upper mediastinal contours are unremarkable.  IMPRESSION: 1.  Support apparatus, as above. 2.  Worsening aeration in the right base, compatible with worsening atelectasis and/or consolidation.  Left base is also similar in appearance and unchanged compared to the prior study, as above. 3.  Small bilateral pleural effusions (left greater than right).   Original Report Authenticated By: Trudie Reed, M.D.   Dg Chest Port 1 View  12/04/2012   *RADIOLOGY REPORT*  Clinical Data: Right pleural effusion.  PORTABLE CHEST - 1 VIEW 12:04 p.m.  Comparison: Radiographs dated 12/04/2012 at 5:07 a.m.  Findings: The right effusion has been evacuated.  No pneumothorax.  Marked improvement in the aeration of the right lung.  Infiltrates in the right upper lobe and in the right lower lobe have improved. Hazy infiltrate on the left is unchanged with probable posterior left effusion.  Endotracheal tube, central line, and NG tube are unchanged.  IMPRESSION: Elimination of right pleural effusion.  No pneumothorax.  Improved infiltrates in aeration on the right.   Original Report Authenticated By: Francene Boyers, M.D.   Dg Chest Port 1 View  12/04/2012   *RADIOLOGY REPORT*  Clinical Data: Airspace disease, follow-up  PORTABLE CHEST - 1 VIEW  Comparison: Portable chest x-ray of 12/03/2012  Findings: The lungs are less well aerated with diffuse airspace disease and apparent bilateral effusions right greater than left. The endotracheal tube and left central venous line are unchanged in position.  IMPRESSION: Decrease in poor aeration with diffuse airspace disease and effusions.   Original Report Authenticated By: Dwyane Dee, M.D.   Dg Chest Port 1 View  12/03/2012   *RADIOLOGY REPORT*  Clinical Data: Intubated.  Airspace disease.  Evaluate endotracheal tube.  PORTABLE CHEST - 1 VIEW  Comparison: 12/02/2012.  Findings: Endotracheal tube  tip is 38 mm from the carina.  Enteric tube is present with the tip not visualized.  Worsening bilateral airspace disease, likely representing pulmonary edema.  Development of small bilateral pleural effusions.  Basilar atelectasis. Cardiopericardial silhouette appears unchanged.  Left IJ central line appears similar.  IMPRESSION:  1.  Support apparatus in good position.  Endotracheal tube 38 mm from the carina. 2.  Worsening airspace disease, basilar atelectasis and effusion. Rapidly increasing airspace disease is compatible with pulmonary edema.   Original Report Authenticated By: Andreas Newport, M.D.   Dg Chest Port 1 View  12/02/2012   *RADIOLOGY REPORT*  Clinical Data: Central line placement  PORTABLE CHEST - 1 VIEW  Comparison: Portable chest x-ray of 12/02/2012  Findings: A left IJ central venous line has been inserted with the tip overlying the upper SVC.  There has been some worsening of airspace disease bilaterally most consistent with edema.  There may be small effusions present.  Mild cardiomegaly is noted.  An endotracheal tube is noted with the tip approximately 5.6 cm above the carina.  An NG tube is noted.  IMPRESSION:  1.  The left IJ central venous line tip overlies the mid upper SVC. No pneumothorax. 2.  Worsening of airspace disease most consistent with edema.   Original Report Authenticated By: Dwyane Dee, M.D.   Dg Chest Port 1 View  12/02/2012   *RADIOLOGY REPORT*  Clinical Data: Evaluate endotracheal tube position, aspiration pneumonia  PORTABLE CHEST - 1 VIEW  Comparison: Portable chest x-ray of 12/01/2012  Findings: The tip of the endotracheal tube remains in good position well above the carina.  There has been some worsening of airspace disease with slightly diminished aeration.  Mild cardiomegaly is stable.  An NG tube remains.  IMPRESSION:  1.  Some worsening of airspace disease.  Slightly diminished aeration. 2.  No significant change in position of endotracheal tube tip.    Original Report Authenticated By: Dwyane Dee, M.D.   Dg Chest Port 1 View  12/01/2012   *RADIOLOGY REPORT*  Clinical Data: Aspiration pneumonia  PORTABLE CHEST - 1 VIEW  Comparison: Yesterday  Findings: Endotracheal tube and NG tube are stable.  Bilateral patchy airspace disease is stable.  No pneumothorax.  Normal heart  size.  IMPRESSION: Stable patchy bilateral airspace disease.   Original Report Authenticated By: Jolaine Click, M.D.   Dg Chest Port 1 View  11/30/2012   *RADIOLOGY REPORT*  Clinical Data: Hypoxia, altered mental status  PORTABLE CHEST - 1 VIEW  Comparison: 08/07/2012; 05/20/2012  Findings:  Grossly unchanged cardiac silhouette and mediastinal contours given slightly reduced lung volumes.  An enteric tube tip and side port projects below the left hemidiaphragm.  Interval development of heterogeneous air space opacities within the peripheral aspect the right mid lung as well as the left lower / retrocardiac lung.  No definite pleural effusion or pneumothorax.  No definite evidence of edema.  Old/healed fracture involving the surgical neck of the right humerus. No acute osseous abnormalities.  IMPRESSION: Bilateral heterogeneous air space opacities worrisome for multifocal infection.  A follow-up chest radiograph in 4 to 6 weeks after treatment is recommended to ensure resolution.   Original Report Authenticated By: Tacey Ruiz, MD   Dg Shoulder Left  11/29/2012   *RADIOLOGY REPORT*  Clinical Data: Larey Seat this morning with severe shoulder pain and decreased range of motion  LEFT SHOULDER - 2+ VIEW  Comparison: None.  Findings: There is a slightly angulated impacted fracture of the left humeral neck.  The left humeral head remains in normal position.  The left [facility] joint is normally aligned.  The ribs are visualized are intact.  IMPRESSION: Slightly angulated and impacted fracture of the left humeral neck.   Original Report Authenticated By: Dwyane Dee, M.D.   Dg Abd Portable 1v  12/01/2012    *RADIOLOGY REPORT*  Clinical Data: Replacement of nasogastric tube.  Post endoscopy.  PORTABLE ABDOMEN - 1 VIEW  Comparison: None.  Findings: A gastric tube is in place extending near the stomach with its tip probably at the junction of the second and third portions of the duodenum.  Calcifications are seen at the pancreas consistent with chronic pancreatitis.  There is a moderate amount of gas within the small bowel probably secondary to endoscopy performed the same date.  Moderate stool in the colon especially in the rectum.  No additional acute findings.  IMPRESSION: The tip of the orogastric tube is in the duodenum probably between the second and third portions of the duodenum.  Moderate air is present in the small bowel probably secondary to recently performed endoscopy.  No acute findings.   Original Report Authenticated By: Sander Radon, M.D.    DISCHARGE EXAMINATION: Filed Vitals:   12/09/12 1437 12/09/12 2136 12/10/12 0600 12/10/12 0855  BP: 120/81 148/86 123/78 152/95  Pulse: 96 90 74 99  Temp: 98.6 F (37 C) 99.1 F (37.3 C) 98.3 F (36.8 C)   TempSrc: Oral     Resp: 16 16 16    Height:      Weight:      SpO2: 93% 93% 91%    General appearance: alert, cooperative and no distress Resp: few crackles at bases. No wheezing Cardio: regular rate and rhythm, S1, S2 normal, no murmur, click, rub or gallop GI: soft, non-tender; bowel sounds normal; no masses,  no organomegaly Neurologic: Alert and oriented X 3, normal strength and tone. Normal symmetric reflexes. Normal coordination and gait  DISPOSITION: Home with home health  Discharge Orders   Future Orders Complete By Expires     Diet - low sodium heart healthy  As directed     Discharge instructions  As directed     Comments:      Avoid consumption of alcohol.  Seek attention if you develop shortness of breath or fever or other symptoms.    Increase activity slowly  As directed        ALLERGIES:  Allergies  Allergen  Reactions  . Amitriptyline Hcl Swelling    In the face.  . Doxycycline Hyclate Itching    Feels like something crawling under her skin    Current Discharge Medication List    START taking these medications   Details  levofloxacin (LEVAQUIN) 500 MG tablet Take 1 tablet (500 mg total) by mouth every other day. For 3 more days Qty: 3 tablet, Refills: 0      CONTINUE these medications which have NOT CHANGED   Details  acetaminophen (TYLENOL) 500 MG tablet Take 500 mg by mouth at bedtime as needed for pain (sleep).    amLODipine (NORVASC) 10 MG tablet Take 0.5 tablets (5 mg total) by mouth daily. For hypertension Qty: 30 tablet, Refills: 3    amylase-lipase-protease (PANGESTYME EC) 20-4.5-25 MU per capsule Take 1 capsule by mouth 3 (three) times daily with meals. For low pancreatic function Qty: 90 capsule, Refills: 4    aspirin EC 81 MG tablet Take 81 mg by mouth every evening.    calcium carbonate (TUMS) 500 MG chewable tablet Chew 6 tablets (1,200 mg of elemental calcium total) by mouth 3 (three) times daily. For bone health Qty: 90 tablet, Refills: 3    ferrous sulfate 325 (65 FE) MG tablet Take 325 mg by mouth 2 (two) times daily with a meal.    FLUoxetine (PROZAC) 10 MG capsule Take 1 capsule (10 mg total) by mouth daily. For depression Qty: 30 capsule, Refills: 0    folic acid (FOLVITE) 1 MG tablet Take 1 tablet (1 mg total) by mouth daily. For folic acid replacement Qty: 30 tablet, Refills: 1    furosemide (LASIX) 20 MG tablet Take 1 tablet (20 mg total) by mouth daily. Qty: 30 tablet, Refills: 1    gabapentin (NEURONTIN) 300 MG capsule Take 2 capsules (600 mg total) by mouth 3 (three) times daily. For anxiety/pain control Qty: 90 capsule, Refills: 2    magnesium oxide (MAG-OX) 400 MG tablet Take 400 mg by mouth 3 (three) times daily.    Multiple Vitamin (MULTIVITAMIN WITH MINERALS) TABS Take 1 tablet by mouth daily. For vitamin replacement Qty: 30 tablet, Refills:  11    Nutritional Supplements (ENSURE HIGH PROTEIN PO) Take 1 Can by mouth 3 (three) times daily. chocolate    omeprazole (PRILOSEC) 40 MG capsule Take 1 capsule (40 mg total) by mouth daily. Qty: 30 capsule, Refills: 3    oxyCODONE-acetaminophen (PERCOCET) 5-325 MG per tablet Take 1 tablet by mouth every 6 (six) hours as needed for pain. Qty: 15 tablet, Refills: 0    potassium chloride SA (KLOR-CON M20) 20 MEQ tablet Take 1 tablet (20 mEq total) by mouth daily. For low potassium    thiamine (VITAMIN B-1) 100 MG tablet Take 1 tablet (100 mg total) by mouth daily. For low thiamine Qty: 90 tablet, Refills: 3    vitamin B-12 (CYANOCOBALAMIN) 250 MCG tablet Take 250 mcg by mouth every evening.    Vitamin D, Ergocalciferol, (DRISDOL) 50000 UNITS CAPS Take 1 capsule (50,000 Units total) by mouth every 7 (seven) days. Thursdays: For bone health Qty: 30 capsule    VOLTAREN 1 % GEL Apply 1 application topically 3 (three) times daily as needed (pain).       STOP taking these medications     hydrochlorothiazide (  HYDRODIURIL) 25 MG tablet        Follow-up Information   Follow up with Cammy Copa, MD. Schedule an appointment as soon as possible for a visit in 2 weeks. (for shoulder)    Contact information:   [address] [place] [address] [phone]       Follow up with Genelle Gather, MD.   Contact information:   [address] [place] [address] [phone]       TOTAL DISCHARGE TIME: 35 mins  [facility]  [facility] Pager [phone]  12/10/2012, 11:40 AM

## 2012-12-10 NOTE — Progress Notes (Signed)
Occupational Therapy Treatment Patient Details Name: [name] [name] MRN: [id number] DOB: 1955/08/27 Today's Date: 12/10/2012 Time: 4098-1191 OT Time Calculation (min): 28 min  OT Assessment / Plan / Recommendation  OT comments  Pt progressing well. She did well with quad cane to transfer to toilet and to chair. Education continued on L UE self care, DME and will need 3in1 for home. Pt states family can be with her at discharge.   Follow Up Recommendations  Home health OT;Supervision/Assistance - 24 hour    Barriers to Discharge       Equipment Recommendations  3 in 1 bedside comode    Recommendations for Other Services    Frequency Min 2X/week   Progress towards OT Goals Progress towards OT goals: Goals updated - see care plan  Plan Discharge plan needs to be updated    Precautions / Restrictions Precautions Precautions: Shoulder Shoulder Interventions: Shoulder sling/immobilizer Precaution Comments: L UE Restrictions Weight Bearing Restrictions: No Other Position/Activity Restrictions: assuming NWB L UE   Pertinent Vitals/Pain No complaint of pain    ADL  Grooming: Performed;Wash/dry hands;Min guard Where Assessed - Grooming: Unsupported standing Upper Body Bathing: Simulated;Chest;Right arm;Left arm;Abdomen;Minimal assistance Where Assessed - Upper Body Bathing: Supported sitting Toilet Transfer: Performed;Min Pension scheme manager Method: Other (comment) (with quad cane to 3in1) Toilet Transfer Equipment: Raised toilet seat with arms (or 3-in-1 over toilet) Toileting - Clothing Manipulation and Hygiene: Simulated;Minimal assistance (min assist to manage gown on L side) Where Assessed - Toileting Clothing Manipulation and Hygiene: Sit to stand from 3-in-1 or toilet ADL Comments: Pt did well with quad cane up to bathroom. Min verbal cues for proper hand placement to self assist with standing and sitting. Educated on how to adjust 3in1 to appropriate height. She still  has some difficulty standing from lower surfaces so will benefit from 3in1. Pt's sling was loose and not in proper position. Adjusted sling and pt educated on need to make sure sling is snug enough that sling wont  move out of place.. Explained importance of keeping UE in proper position to alllow healing of shoulder. Pt verbalized understanding. Pt simulated how to wash under L UE and did well with this. Pt states she came out of sling yesterday evening with family member and did elbow, wrist and digit exercises. She verbalizes understanding of these after OT reviewed.     OT Diagnosis:    OT Problem List:   OT Treatment Interventions:     OT Goals(current goals can now be found in the care plan section) Acute Rehab OT Goals Patient Stated Goal: wants to do for self Potential to Achieve Goals: Good ADL Goals Pt Will Transfer to Toilet: with supervision;bedside commode;ambulating  Visit Information  Last OT Received On: 12/10/12 Assistance Needed: +1 History of Present Illness: This is an unfortunate 44 AAF who is known to have some psychiatric problem and history of EtOH abuse who was in an inpatient rehab. Apparently yesterday she had a fall with resulting fracture of the left humeral neck. Today she woke up around 4AM, and was noted to be drowsy and not very responsive, which is unusual for her    Subjective Data      Prior Functioning       Cognition  Cognition Arousal/Alertness: Awake/alert Behavior During Therapy: WFL for tasks assessed/performed Overall Cognitive Status: Within Functional Limits for tasks assessed    Mobility  Bed Mobility Bed Mobility: Supine to Sit Supine to Sit: With rails;5: Supervision Transfers Transfers: Sit to  Stand;Stand to Sit Sit to Stand: 5: Supervision;With upper extremity assist;From chair/3-in-1 Stand to Sit: 5: Supervision;With upper extremity assist;To chair/3-in-1 Details for Transfer Assistance: min verbal cues for hand placement.     Exercises      Balance     End of Session OT - End of Session Activity Tolerance: Patient tolerated treatment well Patient left: in chair;with call bell/phone within reach  GO     [name] [name] [phone] 12/10/2012, 9:47 AM

## 2012-12-24 ENCOUNTER — Telehealth: Payer: Self-pay | Admitting: *Deleted

## 2012-12-24 NOTE — Telephone Encounter (Signed)
[facility] pt Thursday, will encourage her to be seen in imc

## 2012-12-24 NOTE — Discharge Summary (Signed)
Physician Discharge Summary Note  Patient:  [name] [name] is an 57 y.o., female MRN:  [id number] DOB:  09/19/1955 Patient phone:  [phone] (home)  Patient address:   [address] [place] [address],   Date of Admission:  11/26/2012 Date of Discharge: 11/30/2012  Reason for Admission:  Alcohol dependency and detox  Discharge Diagnoses: Principal Problem:   PANCREATITIS Active Problems:   Alcohol dependence  Review of Systems  Constitutional: Negative.   HENT:       Bleed from the mouth  Eyes: Negative.   Respiratory: Negative.   Cardiovascular: Negative.   Gastrointestinal: Negative.   Genitourinary: Negative.   Musculoskeletal: Negative.   Skin: Negative.   Neurological: Positive for sensory change.  Psychiatric/Behavioral: Positive for substance abuse.   Axis Diagnosis:   AXIS I:  Alcohol Abuse AXIS II:  Deferred AXIS III:   Past Medical History  Diagnosis Date  . Anemia, B12 deficiency   . History of acute pancreatitis   . Right knee pain     No recent imaging on chart  . 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 Dr. Su Hilt, [facility]  . Hypertriglyceridemia   . GERD (gastroesophageal reflux disease)   . Vitamin D deficiency   . Subdural hematoma 02/2008    Likely 2/2 trauma from seizure from EtOH withdrawal, chronic in nature, sees Dr. Robyne Askew. Most recent CT head 10/2009 showing stable but persistent hematoma without mass effect.  . History of seizure disorder     Likely 2/2 alcohol abuse  . Hypocalcemia   . Hypomagnesemia   . Failure to thrive in childhood     Unclear etiology  . HTN (hypertension)   . Thrombocytopenia   . Anemia, macrocytic   . Hepatomegaly     On exam  . Alcohol abuse   . Joint pain   . Alcohol abuse   . Arthritis   . Vitamin D deficiency   . Menopause   . Pancreatitis   . Insomnia   . Hyperlipidemia   . Sinusitis   . Pernicious anemia   .  Subdural hematoma   . Macrocytic anemia   . Hepatomegaly   . 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 Seizures     1.5 YRS  LAST ONE  . Depression   . Fx humeral neck 04/17/2011    Transverse fracture- minimally displaced- managed as outpatient   . ABNORMAL PAP SMEAR, LGSIL 07/23/2008    Annotation: HPV positive CIN I Dr. Su Hilt, [facility] Qualifier: Diagnosis of  By: Danae Chen    . Pneumonia 05/20/2012  . Hepatitis     HEPATOMEGALY    AXIS IV:  other psychosocial or environmental problems, problems related to social environment and problems with primary support group AXIS V:  41-50 serious symptoms  Level of Care:   ED for medical care  Hospital Course:  Patient was admitted for alcohol detox/dependency but started to have medical issues and transferred to the ED for medical care.  Consults:  None  Significant Diagnostic Studies:  labs: Completed and reviewed, stable  Discharge Vitals:   Blood pressure 160/93, pulse 116, temperature 98.6 F (37 C), temperature source Rectal, resp. rate 16, height 5\' 3"  (1.6 m), weight 38.102 kg (84 lb), SpO2 100.00%. Body mass index is 14.88 kg/(m^2). Lab Results:   No results found for this or any previous visit (from the past 72  hour(s)).  Physical Findings: AIMS: Facial and Oral Movements Muscles of Facial Expression: None, normal Lips and Perioral Area: None, normal Jaw: None, normal Tongue: None, normal,Extremity Movements Upper (arms, wrists, hands, fingers): None, normal Lower (legs, knees, ankles, toes): None, normal, Trunk Movements Neck, shoulders, hips: None, normal, Overall Severity Severity of abnormal movements (highest score from questions above): None, normal Incapacitation due to abnormal movements: None, normal Patient's awareness of abnormal movements (rate only patient's report): No Awareness, Dental Status Current problems with teeth and/or dentures?: No Does patient usually wear dentures?:  No  CIWA:  CIWA-Ar Total: 0 COWS:  COWS Total Score: 2  Psychiatric Specialty Exam: See Psychiatric Specialty Exam and Suicide Risk Assessment completed by Attending Physician prior to discharge.  Discharge destination:  Other:  ED  Is patient on multiple antipsychotic therapies at discharge:  No   Has Patient had three or more failed trials of antipsychotic monotherapy by history:  No  Recommended Plan for Multiple Antipsychotic Therapies: N/A     Medication List    ASK your doctor about these medications     Indication   acetaminophen 500 MG tablet  Commonly known as:  TYLENOL  Take 500 mg by mouth at bedtime as needed for pain (sleep).      amLODipine 10 MG tablet  Commonly known as:  NORVASC  Take 0.5 tablets (5 mg total) by mouth daily. For hypertension   Indication:  High Blood Pressure     amylase-lipase-protease 20-4.5-25 MU per capsule  Commonly known as:  PANGESTYME EC  Take 1 capsule by mouth 3 (three) times daily with meals. For low pancreatic function   Indication:  Pancreatic Insufficiency     aspirin EC 81 MG tablet  Take 81 mg by mouth every evening.      calcium carbonate 500 MG chewable tablet  Commonly known as:  TUMS  Chew 6 tablets (1,200 mg of elemental calcium total) by mouth 3 (three) times daily. For bone health   Indication:  For bone health     ENSURE HIGH PROTEIN PO  Take 1 Can by mouth 3 (three) times daily. chocolate      ferrous sulfate 325 (65 FE) MG tablet  Take 325 mg by mouth 2 (two) times daily with a meal.   Indication:  Iron Deficiency     FLUoxetine 10 MG capsule  Commonly known as:  PROZAC  Take 1 capsule (10 mg total) by mouth daily. For depression   Indication:  Anorexia Nervosa, Major Depressive Disorder     folic acid 1 MG tablet  Commonly known as:  FOLVITE  Take 1 tablet (1 mg total) by mouth daily. For folic acid replacement   Indication:  Deficiency of Folic Acid in the Diet, Anemia From Inadequate Folic Acid      furosemide 20 MG tablet  Commonly known as:  LASIX  Take 1 tablet (20 mg total) by mouth daily.      gabapentin 300 MG capsule  Commonly known as:  NEURONTIN  Take 2 capsules (600 mg total) by mouth 3 (three) times daily. For anxiety/pain control   Indication:  Agitation, Neuropathic Pain, For anxiety     hydrochlorothiazide 25 MG tablet  Commonly known as:  HYDRODIURIL  Take 1 tablet (25 mg total) by mouth daily. For high blood pressure control   Indication:  High Blood Pressure     magnesium oxide 400 MG tablet  Commonly known as:  MAG-OX  Take 400 mg by mouth 3 (  three) times daily.      multivitamin with minerals Tabs  Take 1 tablet by mouth daily. For vitamin replacement   Indication:  Low vitamin     omeprazole 40 MG capsule  Commonly known as:  PRILOSEC  Take 1 capsule (40 mg total) by mouth daily.      potassium chloride SA 20 MEQ tablet  Commonly known as:  KLOR-CON M20  Take 1 tablet (20 mEq total) by mouth daily. For low potassium   Indication:  Low Amount of Potassium in the Blood     thiamine 100 MG tablet  Commonly known as:  VITAMIN B-1  Take 1 tablet (100 mg total) by mouth daily. For low thiamine   Indication:  Low thiamine     vitamin B-12 250 MCG tablet  Commonly known as:  CYANOCOBALAMIN  Take 250 mcg by mouth every evening.      Vitamin D (Ergocalciferol) 50000 UNITS Caps  Commonly known as:  DRISDOL  Take 1 capsule (50,000 Units total) by mouth every 7 (seven) days. Thursdays: For bone health   Indication:  Vitamin D Deficiency     VOLTAREN 1 % Gel  Generic drug:  diclofenac sodium  Apply 1 application topically 3 (three) times daily as needed (pain).            Follow-up Information   Follow up with [facility]. (Continue to see Harrold Donath every Mon, Wed, and Fri for Substance Abuse IOP. Hospital followup with psychatrist)    Contact information:   [address] [place], [place] [address] Phone: [phone] Fax:  [phone]      Follow-up recommendations:  Transfer to ED   Comments:  Transfer to ED for medical care  Total Discharge Time:  Greater than 30 minutes.  SignedNanine Means, PMH-NP 12/24/2012, 9:41 AM Agree with assessment and plan Reymundo Poll. Dub Mikes, M.D.

## 2012-12-26 NOTE — Telephone Encounter (Signed)
Sounds good. Thanks 

## 2012-12-26 NOTE — Progress Notes (Signed)
Agree with assessment and plan Natha Guin A. Richard Ritchey, M.D. 

## 2012-12-27 ENCOUNTER — Telehealth: Payer: Self-pay | Admitting: *Deleted

## 2012-12-27 NOTE — Telephone Encounter (Signed)
Needs refill on Cetirizine 10mg  #30 - last filled 12/16/12 and written 11/02/12. Uses [facility]. Stanton Kidney Leiliana Foody RN 12/26/12 4:45PM

## 2012-12-31 MED ORDER — CETIRIZINE HCL 10 MG PO TABS: 10.0000 mg | ORAL_TABLET | Freq: Every day | ORAL | Status: DC

## 2013-01-01 ENCOUNTER — Other Ambulatory Visit: Payer: Self-pay | Admitting: *Deleted

## 2013-01-02 MED ORDER — ASPIRIN EC 81 MG PO TBEC: 81.0000 mg | DELAYED_RELEASE_TABLET | Freq: Every evening | ORAL | Status: DC

## 2013-01-02 MED ORDER — FERROUS SULFATE 325 (65 FE) MG PO TABS: 325.0000 mg | ORAL_TABLET | Freq: Two times a day (BID) | ORAL | Status: DC

## 2013-01-02 MED ORDER — VITAMIN B-1 100 MG PO TABS: 100.0000 mg | ORAL_TABLET | Freq: Every day | ORAL | Status: DC

## 2013-01-02 MED ORDER — CYANOCOBALAMIN 250 MCG PO TABS: 250.0000 ug | ORAL_TABLET | Freq: Every evening | ORAL | Status: DC

## 2013-01-02 MED ORDER — GABAPENTIN 300 MG PO CAPS: 600.0000 mg | ORAL_CAPSULE | Freq: Three times a day (TID) | ORAL | Status: DC

## 2013-01-02 MED ORDER — PANCRELIPASE (LIP-PROT-AMYL) 12000-38000 UNITS PO CPEP: 1.0000 | ORAL_CAPSULE | Freq: Three times a day (TID) | ORAL | Status: DC

## 2013-01-02 MED ORDER — POTASSIUM CHLORIDE CRYS ER 20 MEQ PO TBCR: 20.0000 meq | EXTENDED_RELEASE_TABLET | Freq: Every day | ORAL | Status: DC

## 2013-01-02 MED ORDER — ADULT MULTIVITAMIN W/MINERALS CH: 1.0000 | ORAL_TABLET | Freq: Every day | ORAL | Status: DC

## 2013-01-02 NOTE — Telephone Encounter (Addendum)
She needs a f/u appointment- she has not been seen since she was discharged from the hospital 12/10/12. I have refilled the medications but with no refills.

## 2013-01-14 ENCOUNTER — Ambulatory Visit: Payer: PRIVATE HEALTH INSURANCE | Attending: Orthopedic Surgery | Admitting: Physical Therapy

## 2013-01-14 DIAGNOSIS — IMO0001 Reserved for inherently not codable concepts without codable children: Secondary | ICD-10-CM | POA: Insufficient documentation

## 2013-01-14 DIAGNOSIS — Z9181 History of falling: Secondary | ICD-10-CM | POA: Insufficient documentation

## 2013-01-14 DIAGNOSIS — R293 Abnormal posture: Secondary | ICD-10-CM | POA: Insufficient documentation

## 2013-01-14 DIAGNOSIS — S42309D Unspecified fracture of shaft of humerus, unspecified arm, subsequent encounter for fracture with routine healing: Secondary | ICD-10-CM | POA: Insufficient documentation

## 2013-01-14 DIAGNOSIS — M25519 Pain in unspecified shoulder: Secondary | ICD-10-CM | POA: Insufficient documentation

## 2013-01-14 DIAGNOSIS — M25619 Stiffness of unspecified shoulder, not elsewhere classified: Secondary | ICD-10-CM | POA: Insufficient documentation

## 2013-01-15 ENCOUNTER — Emergency Department (HOSPITAL_COMMUNITY)
Admission: EM | Admit: 2013-01-15 | Discharge: 2013-01-15 | Disposition: A | Discharge status: Home / Self Care | Payer: PRIVATE HEALTH INSURANCE | Attending: Emergency Medicine | Admitting: Emergency Medicine

## 2013-01-15 ENCOUNTER — Encounter (HOSPITAL_COMMUNITY): Payer: Self-pay | Admitting: Emergency Medicine

## 2013-01-15 ENCOUNTER — Emergency Department (HOSPITAL_COMMUNITY): Payer: PRIVATE HEALTH INSURANCE

## 2013-01-15 DIAGNOSIS — Z862 Personal history of diseases of the blood and blood-forming organs and certain disorders involving the immune mechanism: Secondary | ICD-10-CM | POA: Insufficient documentation

## 2013-01-15 DIAGNOSIS — Z8709 Personal history of other diseases of the respiratory system: Secondary | ICD-10-CM | POA: Insufficient documentation

## 2013-01-15 DIAGNOSIS — Z8719 Personal history of other diseases of the digestive system: Secondary | ICD-10-CM | POA: Insufficient documentation

## 2013-01-15 DIAGNOSIS — E559 Vitamin D deficiency, unspecified: Secondary | ICD-10-CM | POA: Insufficient documentation

## 2013-01-15 DIAGNOSIS — Z79899 Other long term (current) drug therapy: Secondary | ICD-10-CM | POA: Insufficient documentation

## 2013-01-15 DIAGNOSIS — Z8619 Personal history of other infectious and parasitic diseases: Secondary | ICD-10-CM | POA: Insufficient documentation

## 2013-01-15 DIAGNOSIS — Z8701 Personal history of pneumonia (recurrent): Secondary | ICD-10-CM | POA: Insufficient documentation

## 2013-01-15 DIAGNOSIS — M129 Arthropathy, unspecified: Secondary | ICD-10-CM | POA: Insufficient documentation

## 2013-01-15 DIAGNOSIS — R42 Dizziness and giddiness: Secondary | ICD-10-CM | POA: Insufficient documentation

## 2013-01-15 DIAGNOSIS — Z8639 Personal history of other endocrine, nutritional and metabolic disease: Secondary | ICD-10-CM | POA: Insufficient documentation

## 2013-01-15 DIAGNOSIS — Z87891 Personal history of nicotine dependence: Secondary | ICD-10-CM | POA: Insufficient documentation

## 2013-01-15 DIAGNOSIS — D510 Vitamin B12 deficiency anemia due to intrinsic factor deficiency: Secondary | ICD-10-CM | POA: Insufficient documentation

## 2013-01-15 DIAGNOSIS — F329 Major depressive disorder, single episode, unspecified: Secondary | ICD-10-CM | POA: Insufficient documentation

## 2013-01-15 DIAGNOSIS — K219 Gastro-esophageal reflux disease without esophagitis: Secondary | ICD-10-CM | POA: Insufficient documentation

## 2013-01-15 DIAGNOSIS — I10 Essential (primary) hypertension: Secondary | ICD-10-CM | POA: Insufficient documentation

## 2013-01-15 DIAGNOSIS — R112 Nausea with vomiting, unspecified: Secondary | ICD-10-CM | POA: Insufficient documentation

## 2013-01-15 DIAGNOSIS — F3289 Other specified depressive episodes: Secondary | ICD-10-CM | POA: Insufficient documentation

## 2013-01-15 DIAGNOSIS — Z8742 Personal history of other diseases of the female genital tract: Secondary | ICD-10-CM | POA: Insufficient documentation

## 2013-01-15 LAB — URINALYSIS, ROUTINE W REFLEX MICROSCOPIC
Ketones, ur: NEGATIVE mg/dL
Leukocytes, UA: NEGATIVE
Nitrite: NEGATIVE
Specific Gravity, Urine: 1.013 (ref 1.005–1.030)
Urobilinogen, UA: 0.2 mg/dL (ref 0.0–1.0)
pH: 5.5 (ref 5.0–8.0)

## 2013-01-15 LAB — CBC WITH DIFFERENTIAL/PLATELET
Basophils Relative: 0 % (ref 0–1)
Eosinophils Absolute: 0.1 10*3/uL (ref 0.0–0.7)
Eosinophils Relative: 1 % (ref 0–5)
HCT: 31.2 % — ABNORMAL LOW (ref 36.0–46.0)
Hemoglobin: 10.7 g/dL — ABNORMAL LOW (ref 12.0–15.0)
MCH: 32.4 pg (ref 26.0–34.0)
MCHC: 34.3 g/dL (ref 30.0–36.0)
MCV: 94.5 fL (ref 78.0–100.0)
Monocytes Absolute: 0.6 10*3/uL (ref 0.1–1.0)
Monocytes Relative: 9 % (ref 3–12)

## 2013-01-15 LAB — COMPREHENSIVE METABOLIC PANEL
Albumin: 3.6 g/dL (ref 3.5–5.2)
BUN: 37 mg/dL — ABNORMAL HIGH (ref 6–23)
Creatinine, Ser: 1.71 mg/dL — ABNORMAL HIGH (ref 0.50–1.10)
GFR calc Af Amer: 38 mL/min — ABNORMAL LOW (ref >90)
Glucose, Bld: 102 mg/dL — ABNORMAL HIGH (ref 70–99)
Total Protein: 9.9 g/dL — ABNORMAL HIGH (ref 6.0–8.3)

## 2013-01-15 LAB — URINE MICROSCOPIC-ADD ON

## 2013-01-15 LAB — ETHANOL: Alcohol, Ethyl (B): <11 mg/dL (ref 0–11)

## 2013-01-15 MED ORDER — MECLIZINE HCL 25 MG PO TABS
25.0000 mg | ORAL_TABLET | Freq: Once | ORAL | Status: AC
  Administered 2013-01-15: 25 mg via ORAL
  Filled 2013-01-15: qty 1

## 2013-01-15 MED ORDER — SODIUM CHLORIDE 0.9 % IV BOLUS (SEPSIS)
1000.0000 mL | Freq: Once | INTRAVENOUS | Status: AC
  Administered 2013-01-15: 1000 mL via INTRAVENOUS

## 2013-01-15 MED ORDER — MECLIZINE HCL 50 MG PO TABS: 50.0000 mg | ORAL_TABLET | Freq: Three times a day (TID) | ORAL | Status: DC | PRN

## 2013-01-15 NOTE — ED Notes (Signed)
Dizzy for the last few days  Has been taking her meds she staes worse when she lays and when she bends down

## 2013-01-15 NOTE — ED Provider Notes (Signed)
CSN: [id number]     Arrival date & time 01/15/13  1136 History     First MD Initiated Contact with Patient 01/15/13 1215     Chief Complaint  Patient presents with  . Dizziness   (Consider location/radiation/quality/duration/timing/severity/associated sxs/prior Treatment) HPI Comments: Patient reports dizziness that began yesterday.  States she has been lightheaded every day for several months, and yesterday she began having dizziness (room spinning).  Dizziness occurs only when lying flat, moving from leaning forward to standing up, or with head movement from side to side (either side).  Pt has a hx alcoholism, is currently drinking, last ETOH was last night (2 "juice glasses" of wine)  States the ETOH makes the dizziness worse.  Did vomit once this morning, stated it was the buttermilk that she drank.  Denies bloody emesis.  Has also had recent GI bleed but currently denies any bleeding or bruising.  Denies fevers, chills, body aches, CP, SOB, cough, diarrhea, hemactochezia, melena, hematemesis, urinary symptoms.    The history is provided by the patient.    Past Medical History  Diagnosis Date  . Anemia, B12 deficiency   . History of acute pancreatitis   . Right knee pain     No recent imaging on chart  . 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 Dr. Su Hilt, [facility]  . Hypertriglyceridemia   . GERD (gastroesophageal reflux disease)   . Vitamin D deficiency   . Subdural hematoma 02/2008    Likely 2/2 trauma from seizure from EtOH withdrawal, chronic in nature, sees Dr. Robyne Askew. Most recent CT head 10/2009 showing stable but persistent hematoma without mass effect.  . History of seizure disorder     Likely 2/2 alcohol abuse  . Hypocalcemia   . Hypomagnesemia   . Failure to thrive in childhood     Unclear etiology  . HTN (hypertension)   . Thrombocytopenia   . Anemia, macrocytic   . Hepatomegaly     On exam   . Alcohol abuse   . Joint pain   . Alcohol abuse   . Arthritis   . Vitamin D deficiency   . Menopause   . Pancreatitis   . Insomnia   . Hyperlipidemia   . Sinusitis   . Pernicious anemia   . Subdural hematoma   . Macrocytic anemia   . Hepatomegaly   . 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 Seizures     1.5 YRS  LAST ONE  . Depression   . Fx humeral neck 04/17/2011    Transverse fracture- minimally displaced- managed as outpatient   . ABNORMAL PAP SMEAR, LGSIL 07/23/2008    Annotation: HPV positive CIN I Dr. Su Hilt, [facility] Qualifier: Diagnosis of  By: Danae Chen    . Pneumonia 05/20/2012  . Hepatitis     HEPATOMEGALY    Past Surgical History  Procedure Laterality Date  . Cesarean section  1983  . Esophagogastroduodenoscopy  07/11/2011    Procedure: ESOPHAGOGASTRODUODENOSCOPY (EGD);  Surgeon: Theda Belfast, MD;  Location: [facility];  Service: Endoscopy;  Laterality: N/A;  . Colonoscopy  07/11/2011    Procedure: COLONOSCOPY;  Surgeon: Theda Belfast, MD;  Location: [facility];  Service: Endoscopy;  Laterality: N/A;  . Eye surgery      LEFT EYE YRS AGO   . Rt colectomy  08/28/2011  . Esophagogastroduodenoscopy N/A 12/01/2012    Procedure: ESOPHAGOGASTRODUODENOSCOPY (  EGD);  Surgeon: Hilarie Fredrickson, MD;  Location: [facility];  Service: Endoscopy;  Laterality: N/A;   Family History  Problem Relation Age of Onset  . Cancer Mother     Died from stomach cancer and "flesh eating rash  . Heart failure Father     Died in 65s from an MI  . Alcohol abuse Sister     Twin sister drinks a lot, as did both her parents and brothers  . Stroke Brother     Has 7 brothers, 1 with CVA   History  Substance Use Topics  . Smoking status: Former Smoker    Types: Cigarettes    Quit date: 09/20/2010  . Smokeless tobacco: Never Used  . Alcohol Use: 6.0 oz/week    10 Glasses of wine per week     Comment: No alocohol x 3 weeks   OB History   Grav Para Term  Preterm Abortions TAB SAB Ect Mult Living                 Review of Systems  Constitutional: Negative for fever and chills.  HENT: Negative for neck pain.   Respiratory: Negative for cough and shortness of breath.   Cardiovascular: Negative for chest pain.  Gastrointestinal: Positive for nausea and vomiting. Negative for abdominal pain, diarrhea and blood in stool.  Genitourinary: Negative for dysuria, urgency and frequency.  Neurological: Positive for dizziness and light-headedness.    Allergies  Amitriptyline hcl and Doxycycline hyclate  Home Medications   Current Outpatient Rx  Name  Route  Sig  Dispense  Refill  . acetaminophen (TYLENOL) 500 MG tablet   Oral   Take 500 mg by mouth at bedtime as needed for pain (sleep).         Marland Kitchen amLODipine (NORVASC) 10 MG tablet   Oral   Take 0.5 tablets (5 mg total) by mouth daily. For hypertension   30 tablet   3   . aspirin EC 81 MG tablet   Oral   Take 1 tablet (81 mg total) by mouth every evening.   30 tablet   0   . calcium carbonate (TUMS) 500 MG chewable tablet   Oral   Chew 6 tablets (1,200 mg of elemental calcium total) by mouth 3 (three) times daily. For bone health   90 tablet   3   . cetirizine (ZYRTEC) 10 MG tablet   Oral   Take 1 tablet (10 mg total) by mouth daily.   30 tablet   0   . ferrous sulfate 325 (65 FE) MG tablet   Oral   Take 1 tablet (325 mg total) by mouth 2 (two) times daily with a meal.   60 tablet   0   . FLUoxetine (PROZAC) 10 MG capsule   Oral   Take 1 capsule (10 mg total) by mouth daily. For depression   30 capsule   0   . folic acid (FOLVITE) 1 MG tablet   Oral   Take 1 tablet (1 mg total) by mouth daily. For folic acid replacement   30 tablet   1   . furosemide (LASIX) 20 MG tablet   Oral   Take 1 tablet (20 mg total) by mouth daily.   30 tablet   1   . gabapentin (NEURONTIN) 300 MG capsule   Oral   Take 2 capsules (600 mg total) by mouth 3 (three) times daily. For  anxiety/pain control   180 capsule   0   .  lipase/protease/amylase (CREON-12/PANCREASE) 12000 UNITS CPEP   Oral   Take 1 capsule by mouth 3 (three) times daily before meals.   270 capsule   0   . magnesium oxide (MAG-OX) 400 MG tablet   Oral   Take 400 mg by mouth 3 (three) times daily.         . Multiple Vitamin (MULTIVITAMIN WITH MINERALS) TABS   Oral   Take 1 tablet by mouth daily. For vitamin replacement   90 tablet   0   . omeprazole (PRILOSEC) 40 MG capsule   Oral   Take 1 capsule (40 mg total) by mouth daily.   30 capsule   3   . oxyCODONE-acetaminophen (PERCOCET) 5-325 MG per tablet   Oral   Take 1 tablet by mouth every 6 (six) hours as needed for pain.   20 tablet   0   . potassium chloride SA (KLOR-CON M20) 20 MEQ tablet   Oral   Take 1 tablet (20 mEq total) by mouth daily. For low potassium   90 tablet   0   . thiamine (VITAMIN B-1) 100 MG tablet   Oral   Take 1 tablet (100 mg total) by mouth daily. For low thiamine   90 tablet   0   . vitamin B-12 (CYANOCOBALAMIN) 250 MCG tablet   Oral   Take 1 tablet (250 mcg total) by mouth every evening.   90 tablet   0   . Vitamin D, Ergocalciferol, (DRISDOL) 50000 UNITS CAPS   Oral   Take 1 capsule (50,000 Units total) by mouth every 7 (seven) days. Thursdays: For bone health   30 capsule      . VOLTAREN 1 % GEL   Topical   Apply 1 application topically 3 (three) times daily as needed (pain).          . Nutritional Supplements (ENSURE HIGH PROTEIN PO)   Oral   Take 1 Can by mouth 3 (three) times daily. chocolate          BP 156/94  Pulse 94  Temp(Src) 98 F (36.7 C)  Resp 13  SpO2 100% Physical Exam  Nursing note and vitals reviewed. Constitutional: She appears well-developed and well-nourished. No distress.  HENT:  Head: Normocephalic and atraumatic.  Neck: Neck supple.  Cardiovascular: Normal rate and regular rhythm.   Pulmonary/Chest: Effort normal and breath sounds normal. No  respiratory distress. She has no wheezes. She has no rales.  Abdominal: Soft. She exhibits no distension. There is no tenderness. There is no rebound and no guarding.  Neurological: She is alert. She has normal strength. No cranial nerve deficit or sensory deficit. She exhibits normal muscle tone. Coordination normal. GCS eye subscore is 4. GCS verbal subscore is 5. GCS motor subscore is 6.  CN II-XII intact, EOMs intact, no pronator drift, grip strengths equal bilaterally; strength 5/5 in all extremities, sensation intact in all extremities; finger to nose, heel to shin, rapid alternating movements normal.  Some truncal ataxia while patient is sitting upright.  When patient lies flat and turns head to side, pt does have horizontal nystagmus.   Skin: She is not diaphoretic.    ED Course   Procedures (including critical care time)  Labs Reviewed  CBC WITH DIFFERENTIAL - Abnormal; Notable for the following:    RBC 3.30 (*)    Hemoglobin 10.7 (*)    HCT 31.2 (*)    All other components within normal limits  COMPREHENSIVE METABOLIC PANEL - Abnormal;  Notable for the following:    Sodium 131 (*)    CO2 15 (*)    Glucose, Bld 102 (*)    BUN 37 (*)    Creatinine, Ser 1.71 (*)    Total Protein 9.9 (*)    AST 122 (*)    ALT 38 (*)    Alkaline Phosphatase 306 (*)    GFR calc non Af Amer 33 (*)    GFR calc Af Amer 38 (*)    All other components within normal limits  URINALYSIS, ROUTINE W REFLEX MICROSCOPIC - Abnormal; Notable for the following:    Protein, ur >300 (*)    All other components within normal limits  URINE MICROSCOPIC-ADD ON - Abnormal; Notable for the following:    Bacteria, UA FEW (*)    All other components within normal limits  ETHANOL   Ct Head Wo Contrast  01/15/2013   *RADIOLOGY REPORT*  Clinical Data: Dizziness  CT HEAD WITHOUT CONTRAST  Technique:  Contiguous axial images were obtained from the base of the skull through the vertex without contrast.  Comparison:  11/30/2012  Findings: Stable diffuse brain atrophy and chronic microvascular ischemic changes throughout the white matter.  No acute intracranial hemorrhage, definite infarction, mass lesion, midline shift, herniation, hydrocephalus, or extra-axial fluid collection. No focal mass effect or edema.  Cisterns are patent.  No cerebellar abnormality.  Symmetric orbits.  Minor sphenoid mucosal thickening. Other sinuses clear.  Mastoids are clear.  IMPRESSION: Stable atrophy and chronic microvascular ischemic changes.   Original Report Authenticated By: Judie Petit. Shick, M.D.    12:52 PM Discussed Pt with Dr Jodi Mourning.   2:26 PM Pt feeling much better.  No vertiginous symptoms after meclizine.  HR 90s, IVF running.  Nystagmus gone.  Turning head to side and lying no longer elicits symptoms.  Discussed with Dr Jodi Mourning who agrees with discharge.   1. Vertigo     MDM  Pt with vertiginous symptoms reproducible with head movement.  CT head negative.  Labs c/w her baseline with exception of LFTs which are elevated compared to last month.  AST>ALT, likely related to alcohol abuse.  No abdominal pain or tenderness.  ETOH negative.  Last ETOH last night.  No withdrawal signs or symptoms. IVF, meclizine given.  Pt feels much better and vertigo is gone after 1 dose meclizine.  D/C home with same and PCP follow up.  Pt retested, no longer has truncal ataxia on recheck.  Pt states she walked to the bathroom without dizziness or balance difficulties. Likely peripheral vertigo. Doubt acute stroke. Discussed all results with patient.  Pt given return precautions.  Pt verbalizes understanding and agrees with plan.       Trixie Dredge, PA-C 01/15/13 1437  Medical screening examination/treatment/procedure(s) were conducted as a shared visit with non-physician practitioner(s) or resident  and myself.  I personally evaluated the patient during the encounter and agree with the findings and plan unless otherwise indicated.    Normal neuro  exam in ED (CNs, cerebellar, strength, gait).  CT no acute findings.  Multiple medical problems for which pt needs close outpt fup.  Improved in ED on recheck, comfortable going home and well appearing. Labs mostly chronic/ etoh related.  Enid Skeens, MD 01/16/13 2157

## 2013-01-15 NOTE — ED Notes (Signed)
Dr Zavitz at bedside  

## 2013-01-15 NOTE — ED Notes (Signed)
Patient transported to CT 

## 2013-01-15 NOTE — ED Notes (Signed)
Pt reports dizziness worse with bending over and laying down. Denies dizziness at this time. Pt reports having hx of dizziness but this is different. Pt reports alcohol abuse and has been drinking everyday since Sunday. Dizziness increased yesterday and this AM.

## 2013-01-21 ENCOUNTER — Ambulatory Visit: Payer: PRIVATE HEALTH INSURANCE | Admitting: Rehabilitation

## 2013-01-22 ENCOUNTER — Ambulatory Visit: Payer: PRIVATE HEALTH INSURANCE | Admitting: Rehabilitation

## 2013-01-24 ENCOUNTER — Other Ambulatory Visit: Payer: Self-pay | Admitting: *Deleted

## 2013-01-24 MED ORDER — FERROUS SULFATE 325 (65 FE) MG PO TABS: 325.0000 mg | ORAL_TABLET | Freq: Two times a day (BID) | ORAL | Status: DC

## 2013-01-24 MED ORDER — ASPIRIN EC 81 MG PO TBEC: 81.0000 mg | DELAYED_RELEASE_TABLET | Freq: Every evening | ORAL | Status: DC

## 2013-01-24 NOTE — Telephone Encounter (Signed)
She needs to be seen in clinic

## 2013-01-24 NOTE — Telephone Encounter (Signed)
Message sent to front office to schedule pt an appt. 

## 2013-01-27 ENCOUNTER — Ambulatory Visit: Payer: PRIVATE HEALTH INSURANCE | Admitting: Physical Therapy

## 2013-01-29 ENCOUNTER — Ambulatory Visit: Payer: PRIVATE HEALTH INSURANCE | Admitting: Rehabilitation

## 2013-01-30 ENCOUNTER — Ambulatory Visit (INDEPENDENT_AMBULATORY_CARE_PROVIDER_SITE_OTHER): Payer: PRIVATE HEALTH INSURANCE | Admitting: Internal Medicine

## 2013-01-30 ENCOUNTER — Ambulatory Visit: Payer: PRIVATE HEALTH INSURANCE | Admitting: Rehabilitation

## 2013-01-30 ENCOUNTER — Encounter: Payer: Self-pay | Admitting: Internal Medicine

## 2013-01-30 VITALS — BP 112/77 | HR 94 | Temp 98.0°F | Ht 61.75 in | Wt 87.0 lb

## 2013-01-30 DIAGNOSIS — Z0000 Encounter for general adult medical examination without abnormal findings: Secondary | ICD-10-CM

## 2013-01-30 DIAGNOSIS — Z299 Encounter for prophylactic measures, unspecified: Secondary | ICD-10-CM

## 2013-01-30 DIAGNOSIS — Z23 Encounter for immunization: Secondary | ICD-10-CM

## 2013-01-30 DIAGNOSIS — S42209A Unspecified fracture of upper end of unspecified humerus, initial encounter for closed fracture: Secondary | ICD-10-CM

## 2013-01-30 DIAGNOSIS — K219 Gastro-esophageal reflux disease without esophagitis: Secondary | ICD-10-CM

## 2013-01-30 DIAGNOSIS — J309 Allergic rhinitis, unspecified: Secondary | ICD-10-CM

## 2013-01-30 DIAGNOSIS — I10 Essential (primary) hypertension: Secondary | ICD-10-CM

## 2013-01-30 DIAGNOSIS — F1020 Alcohol dependence, uncomplicated: Secondary | ICD-10-CM

## 2013-01-30 DIAGNOSIS — N179 Acute kidney failure, unspecified: Secondary | ICD-10-CM

## 2013-01-30 DIAGNOSIS — J302 Other seasonal allergic rhinitis: Secondary | ICD-10-CM

## 2013-01-30 MED ORDER — HYDROCODONE-ACETAMINOPHEN 5-325 MG PO TABS: 1.0000 | ORAL_TABLET | Freq: Four times a day (QID) | ORAL | Status: DC | PRN

## 2013-01-30 MED ORDER — CETIRIZINE HCL 10 MG PO TABS: 10.0000 mg | ORAL_TABLET | Freq: Every day | ORAL | Status: DC

## 2013-01-30 NOTE — Patient Instructions (Addendum)
Great job with your alcohol cessation!!! Keep it up!!!  I will see you back in the clinic in 3 months.  I have written a prescription for a small amount of pain medication. This is a narcotic. This is a 1 time narcotic prescription. In the future we will have to try other pain medications.

## 2013-01-30 NOTE — Assessment & Plan Note (Signed)
Cr 1.71 on 01/15/13, which is actually improved from previous lab values. Baseline Cr seems to be around 0.9 until her hospitalization in June, when it was up to 2.4. Since that time, her renal function has been fluctuating somewhat.  - Checking BMP today

## 2013-01-30 NOTE — Assessment & Plan Note (Signed)
Checking Mg level today. Pt on replacement.

## 2013-01-30 NOTE — Assessment & Plan Note (Signed)
BP Readings from Last 3 Encounters:  01/30/13 112/77  01/15/13 148/95  12/10/12 152/95    Lab Results  Component Value Date   NA 131* 01/15/2013   K 4.9 01/15/2013   CREATININE 1.71* 01/15/2013    Assessment: Blood pressure control: controlled Progress toward BP goal:  at goal   Plan: Medications:  continue current medications Educational resources provided: brochure;handout;video

## 2013-01-30 NOTE — Assessment & Plan Note (Signed)
S/p fall on 11/30/12 with fracture of left humeral head. She has been seeing Orthopedics, Dr. August Saucer, and is now only being seen by PT. she states that physical therapy is helping her pain. Today she has kinesio taping on her left shoulder, which she says is wonderful in much better than the regular arm splint. While her pain has improved greatly, she still is having pain at night primarily. She states that she was receiving oxycodone from the orthopedist, and is requesting pain medication today.  Prescribing Vicodin 5-325 1 tablet every 6 hours when necessary pain, dispense #20, 0 refills. I told her that the narcotic is only with this fracture, and that I would not continue to fill the prescription in the future.   Also, with this fall resulting in the fracture, she states that this was from standing. ?is this a pathological fracture? She is currently on Calcium and Vit D supplements.  - Consider DEXA scan at next visit.

## 2013-01-30 NOTE — Assessment & Plan Note (Signed)
Refilled Zyrtec today.

## 2013-01-30 NOTE — Progress Notes (Signed)
[name], female   DOB: 11-04-1955, 57 y.o.   MRN: [id number]  Subjective:   [name] female   DOB: [birth year] 57 y.o.   MRN: [id number]  HPI: [name] is a 57 y.o. F with PMH  Recently d/c'd from the hospital after being admitted with hematemesis 2/2 GI bleed, aspiration pneumonia with acute respiratory failure requiring intubation, acute on chronic kidney disease. This occurred just after she completed EtOH detox at [facility], and at the time, she states that she fell and fractured her left humeral head.   Today she is doing very well. She denies any further hemoptysis or noticed any GI bleeding, and she has remained sober since June. She is attending EtOH support classes 3x week and is exercising frequently.   For her humerus fracture, she has been seeing an Orthopedist and is currently bring seen by Physical Therapy. She feels that her arm pain is much improved, but she is requesting narcotic pain medication today.   She was seen in the ED on 01/15/13 c/o dizziness. Dix-Hallpike  maneuver was positive, and she was dx with vertigo. Meclizine was prescribed, and she states that since taking the medication her sx have resolved.   Past Medical History  Diagnosis Date  . Anemia, B12 deficiency   . History of acute pancreatitis   . Right knee pain     No recent imaging on chart  . 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 Dr. Su Hilt, [facility]  . Hypertriglyceridemia   . GERD (gastroesophageal reflux disease)   . Vitamin D deficiency   . Subdural hematoma 02/2008    Likely 2/2 trauma from seizure from EtOH withdrawal, chronic in nature, sees Dr. Robyne Askew. Most recent CT head 10/2009 showing stable but persistent hematoma without mass effect.  . History of seizure disorder     Likely 2/2 alcohol abuse  . Hypocalcemia   . Hypomagnesemia   . Failure to thrive in  childhood     Unclear etiology  . HTN (hypertension)   . Thrombocytopenia   . Anemia, macrocytic   . Hepatomegaly     On exam  . Alcohol abuse   . Joint pain   . Alcohol abuse   . Arthritis   . Vitamin D deficiency   . Menopause   . Pancreatitis   . Insomnia   . Hyperlipidemia   . Sinusitis   . Pernicious anemia   . Subdural hematoma   . Macrocytic anemia   . Hepatomegaly   . 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 Seizures     1.5 YRS  LAST ONE  . Depression   . Fx humeral neck 04/17/2011    Transverse fracture- minimally displaced- managed as outpatient   . ABNORMAL PAP SMEAR, LGSIL 07/23/2008    Annotation: HPV positive CIN I Dr. Su Hilt, [facility] Qualifier: Diagnosis of  By: Danae Chen    . Pneumonia 05/20/2012  . Hepatitis     HEPATOMEGALY    Current Outpatient Prescriptions  Medication Sig Dispense Refill  . acetaminophen (TYLENOL) 500 MG tablet Take 500 mg by mouth at bedtime as needed for pain (sleep).      Marland Kitchen amLODipine (NORVASC) 10 MG tablet Take 0.5 tablets (5 mg total) by mouth daily. For hypertension  30 tablet  3  . aspirin EC 81 MG tablet Take 1 tablet (  81 mg total) by mouth every evening.  90 tablet  3  . calcium carbonate (TUMS) 500 MG chewable tablet Chew 6 tablets (1,200 mg of elemental calcium total) by mouth 3 (three) times daily. For bone health  90 tablet  3  . cetirizine (ZYRTEC) 10 MG tablet Take 1 tablet (10 mg total) by mouth daily.  30 tablet  0  . ferrous sulfate 325 (65 FE) MG tablet Take 1 tablet (325 mg total) by mouth 2 (two) times daily with a meal.  180 tablet  3  . FLUoxetine (PROZAC) 10 MG capsule Take 1 capsule (10 mg total) by mouth daily. For depression  30 capsule  0  . folic acid (FOLVITE) 1 MG tablet Take 1 tablet (1 mg total) by mouth daily. For folic acid replacement  30 tablet  1  . furosemide (LASIX) 20 MG tablet Take 1 tablet (20 mg total) by mouth daily.  30 tablet  1  . gabapentin (NEURONTIN) 300 MG  capsule Take 2 capsules (600 mg total) by mouth 3 (three) times daily. For anxiety/pain control  180 capsule  0  . lipase/protease/amylase (CREON-12/PANCREASE) 12000 UNITS CPEP Take 1 capsule by mouth 3 (three) times daily before meals.  270 capsule  0  . magnesium oxide (MAG-OX) 400 MG tablet Take 400 mg by mouth 3 (three) times daily.      . meclizine (ANTIVERT) 50 MG tablet Take 1 tablet (50 mg total) by mouth 3 (three) times daily as needed for dizziness.  30 tablet  0  . Multiple Vitamin (MULTIVITAMIN WITH MINERALS) TABS Take 1 tablet by mouth daily. For vitamin replacement  90 tablet  0  . Nutritional Supplements (ENSURE HIGH PROTEIN PO) Take 1 Can by mouth 3 (three) times daily. chocolate      . omeprazole (PRILOSEC) 40 MG capsule Take 1 capsule (40 mg total) by mouth daily.  30 capsule  3  . oxyCODONE-acetaminophen (PERCOCET) 5-325 MG per tablet Take 1 tablet by mouth every 6 (six) hours as needed for pain.  20 tablet  0  . potassium chloride SA (KLOR-CON M20) 20 MEQ tablet Take 1 tablet (20 mEq total) by mouth daily. For low potassium  90 tablet  0  . thiamine (VITAMIN B-1) 100 MG tablet Take 1 tablet (100 mg total) by mouth daily. For low thiamine  90 tablet  0  . vitamin B-12 (CYANOCOBALAMIN) 250 MCG tablet Take 1 tablet (250 mcg total) by mouth every evening.  90 tablet  0  . Vitamin D, Ergocalciferol, (DRISDOL) 50000 UNITS CAPS Take 1 capsule (50,000 Units total) by mouth every 7 (seven) days. Thursdays: For bone health  30 capsule    . VOLTAREN 1 % GEL Apply 1 application topically 3 (three) times daily as needed (pain).        No current facility-administered medications for this visit.   Family History  Problem Relation Age of Onset  . Cancer Mother     Died from stomach cancer and "flesh eating rash  . Heart failure Father     Died in 65s from an MI  . Alcohol abuse Sister     Twin sister drinks a lot, as did both her parents and brothers  . Stroke Brother     Has 7  brothers, 1 with CVA   History   Social History  . Marital Status: Divorced    Spouse Name: N/A    Number of Children: N/A  . Years of Education: N/A  Social History Main Topics  . Smoking status: Former Smoker    Types: Cigarettes    Quit date: 09/20/2010  . Smokeless tobacco: Never Used  . Alcohol Use: 6.0 oz/week    10 Glasses of wine per week     Comment: No alocohol x 3 weeks  . Drug Use: No     Comment: HX  USE   . Sexual Activity: Not Currently   Other Topics Concern  . None   Social History Narrative   Lives with her significant other and 2 grandchildren. 1 child   Has 7 brothers and 4 sisters, 1 twin sister.   Unemployed, worked in [employer].    Abuses alcohol-drinks 1 glass of wine daily    No drug use. Former cigarette use quit 1.5 years ago.     11 th grade education            Review of Systems: Constitutional: Denies fever, chills, diaphoresis, appetite change and fatigue.  HEENT: +allergic rhinitis Respiratory: Denies SOB, DOE, cough, chest tightness, and wheezing.   Cardiovascular: Denies chest pain, palpitations and leg swelling.  Gastrointestinal: Denies nausea, vomiting, abdominal pain, diarrhea, constipation, blood in stool   Genitourinary: Denies dysuria, urgency, frequency, hematuria, flank pain and difficulty urinating.  Endocrine: Denies: hot or cold intolerance, sweats, polyuria, polydipsia. Musculoskeletal: Denies myalgias, back pain, joint swelling. +LUE pain at times, esp at night Skin: Denies pallor, rash and wound.  Neurological: Denies dizziness, seizures, syncope, weakness, light-headedness, numbness and headaches.  Psychiatric/Behavioral: Denies suicidal ideation, mood changes, confusion, nervousness, sleep disturbance and agitation  Objective:  Physical Exam: Filed Vitals:   01/30/13 1520  BP: 112/77  Pulse: 94  Temp: 98 F (36.7 C)  TempSrc: Oral  Height: 5' 1.75" (1.568 m)  Weight: 87 lb (39.463 kg)  SpO2: 100%    Constitutional: Vital signs reviewed.  Patient is a well-developed and well-nourished female in no acute distress and cooperative with exam. Alert.  Head: Normocephalic and atraumatic Eyes: PERRL, EOMI, conjunctivae normal, No scleral icterus.  Neck: Supple, Trachea midline normal ROM, No JVD Cardiovascular: RRR, S1 normal, S2 normal, no MRG, pulses symmetric and intact bilaterally Pulmonary/Chest: normal respiratory effort, CTAB, no wheezes, rales, or rhonchi Abdominal: Soft. Non-tender, non-distended, bowel sounds are normal, no masses, organomegaly, or guarding present.  Musculoskeletal: LUE with pain on rotation. External sling present made with Kinesio tape. Neurological: A&O x3, Strength is 4/5 in all 4 extremities, cranial nerve II-XII are grossly intact, no focal motor deficit, sensory intact to light touch bilaterally.  Skin: Warm, dry and intact. No rash, cyanosis, or clubbing.  Psychiatric: Normal mood and affect. speech and behavior is normal. Judgment and thought content appear normal.   Assessment & Plan:   Please refer to Problem List based Assessment and Plan

## 2013-01-30 NOTE — Assessment & Plan Note (Signed)
Pt states that she underwent detox at behavioral health in June. She states that she has been sober since that time and is attending an alcoholic support group 3 times a week.

## 2013-01-31 LAB — BASIC METABOLIC PANEL WITH GFR
BUN: 27 mg/dL — ABNORMAL HIGH (ref 6–23)
CO2: 15 mEq/L — ABNORMAL LOW (ref 19–32)
Calcium: 8.2 mg/dL — ABNORMAL LOW (ref 8.4–10.5)
Chloride: 108 mEq/L (ref 96–112)
Creat: 1.59 mg/dL — ABNORMAL HIGH (ref 0.50–1.10)

## 2013-01-31 LAB — MAGNESIUM: Magnesium: 1 mg/dL — ABNORMAL LOW (ref 1.5–2.5)

## 2013-02-02 NOTE — Progress Notes (Signed)
Case discussed with Dr. Glenn soon after the resident saw the patient.  We reviewed the resident's history and exam and pertinent patient test results.  I agree with the assessment, diagnosis, and plan of care documented in the resident's note. 

## 2013-02-04 ENCOUNTER — Ambulatory Visit: Payer: PRIVATE HEALTH INSURANCE | Attending: Rehabilitation | Admitting: Physical Therapy

## 2013-02-04 DIAGNOSIS — Z9181 History of falling: Secondary | ICD-10-CM | POA: Insufficient documentation

## 2013-02-04 DIAGNOSIS — IMO0001 Reserved for inherently not codable concepts without codable children: Secondary | ICD-10-CM | POA: Insufficient documentation

## 2013-02-04 DIAGNOSIS — M25519 Pain in unspecified shoulder: Secondary | ICD-10-CM | POA: Insufficient documentation

## 2013-02-04 DIAGNOSIS — S42309D Unspecified fracture of shaft of humerus, unspecified arm, subsequent encounter for fracture with routine healing: Secondary | ICD-10-CM | POA: Insufficient documentation

## 2013-02-04 DIAGNOSIS — R293 Abnormal posture: Secondary | ICD-10-CM | POA: Insufficient documentation

## 2013-02-04 DIAGNOSIS — M25619 Stiffness of unspecified shoulder, not elsewhere classified: Secondary | ICD-10-CM | POA: Insufficient documentation

## 2013-02-05 ENCOUNTER — Encounter: Payer: Self-pay | Admitting: Rehabilitation

## 2013-02-06 ENCOUNTER — Other Ambulatory Visit: Payer: Self-pay

## 2013-02-06 ENCOUNTER — Encounter: Payer: Self-pay | Admitting: Physical Therapy

## 2013-02-06 DIAGNOSIS — Z1231 Encounter for screening mammogram for malignant neoplasm of breast: Secondary | ICD-10-CM

## 2013-02-11 ENCOUNTER — Other Ambulatory Visit: Payer: Self-pay | Admitting: *Deleted

## 2013-02-12 MED ORDER — OMEPRAZOLE 40 MG PO CPDR: 40.0000 mg | DELAYED_RELEASE_CAPSULE | Freq: Every day | ORAL | Status: DC

## 2013-02-12 MED ORDER — FLUOXETINE HCL 10 MG PO CAPS: 10.0000 mg | ORAL_CAPSULE | Freq: Every day | ORAL | Status: DC

## 2013-02-12 MED ORDER — AMLODIPINE BESYLATE 10 MG PO TABS: 5.0000 mg | ORAL_TABLET | Freq: Every day | ORAL | Status: DC

## 2013-02-12 NOTE — Telephone Encounter (Signed)
Called amlodipine to pharm

## 2013-02-14 ENCOUNTER — Other Ambulatory Visit: Payer: Self-pay | Admitting: *Deleted

## 2013-02-17 MED ORDER — GABAPENTIN 300 MG PO CAPS: 600.0000 mg | ORAL_CAPSULE | Freq: Three times a day (TID) | ORAL | Status: DC

## 2013-03-14 ENCOUNTER — Other Ambulatory Visit: Payer: Self-pay | Admitting: *Deleted

## 2013-03-18 MED ORDER — MAGNESIUM OXIDE 400 MG PO TABS: 400.0000 mg | ORAL_TABLET | Freq: Three times a day (TID) | ORAL | Status: DC

## 2013-03-20 ENCOUNTER — Encounter: Payer: Self-pay | Admitting: Internal Medicine

## 2013-03-20 ENCOUNTER — Ambulatory Visit (INDEPENDENT_AMBULATORY_CARE_PROVIDER_SITE_OTHER): Payer: PRIVATE HEALTH INSURANCE | Admitting: Internal Medicine

## 2013-03-20 VITALS — BP 107/81 | HR 89 | Temp 97.1°F | Ht 61.0 in | Wt 87.8 lb

## 2013-03-20 DIAGNOSIS — Z0000 Encounter for general adult medical examination without abnormal findings: Secondary | ICD-10-CM

## 2013-03-20 DIAGNOSIS — F1010 Alcohol abuse, uncomplicated: Secondary | ICD-10-CM

## 2013-03-20 DIAGNOSIS — S42209A Unspecified fracture of upper end of unspecified humerus, initial encounter for closed fracture: Secondary | ICD-10-CM

## 2013-03-20 DIAGNOSIS — E872 Acidosis, unspecified: Secondary | ICD-10-CM

## 2013-03-20 DIAGNOSIS — N183 Chronic kidney disease, stage 3 unspecified: Secondary | ICD-10-CM

## 2013-03-20 MED ORDER — HYDROCODONE-ACETAMINOPHEN 7.5-325 MG PO TABS: 1.0000 | ORAL_TABLET | Freq: Four times a day (QID) | ORAL | Status: DC | PRN

## 2013-03-20 NOTE — Progress Notes (Signed)
[name], female   DOB: [birth year], 57 y.o.   MRN: [id number]  Subjective:   [name] female   DOB: 07-10-1955 57 y.o.   MRN: [id number]  HPI: [name] is a 57 y.o. Fwith PMH CKD, HTN, EtOH abuse, and new onset non- anion gap metabolic acidosis presents to the clinic for a routine follow up.  She states overall she's doing well. She continues to attend AA meetings, and is graduating to the next level tomorrow.  She continues to endorse pain in her right shoulder at the site of her humeral head fracture. She was given Vicodin in the past and states this did help, but she has run out of medication since. She's requesting a new prescription.   Past Medical History  Diagnosis Date  . Anemia, B12 deficiency   . History of acute pancreatitis   . Right knee pain     No recent imaging on chart  . 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 Dr. Su Hilt, [facility]  . Hypertriglyceridemia   . GERD (gastroesophageal reflux disease)   . Vitamin D deficiency   . Subdural hematoma 02/2008    Likely 2/2 trauma from seizure from EtOH withdrawal, chronic in nature, sees Dr. Robyne Askew. Most recent CT head 10/2009 showing stable but persistent hematoma without mass effect.  . History of seizure disorder     Likely 2/2 alcohol abuse  . Hypocalcemia   . Hypomagnesemia   . Failure to thrive in childhood     Unclear etiology  . HTN (hypertension)   . Thrombocytopenia   . Anemia, macrocytic   . Hepatomegaly     On exam  . Alcohol abuse   . Joint pain   . Alcohol abuse   . Arthritis   . Vitamin D deficiency   . Menopause   . Pancreatitis   . Insomnia   . Hyperlipidemia   . Sinusitis   . Pernicious anemia   . Subdural hematoma   . Macrocytic anemia   . Hepatomegaly   . 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 Seizures     1.5 YRS  LAST ONE  . Depression   . Fx humeral neck 04/17/2011     Transverse fracture- minimally displaced- managed as outpatient   . ABNORMAL PAP SMEAR, LGSIL 07/23/2008    Annotation: HPV positive CIN I Dr. Su Hilt, [facility] Qualifier: Diagnosis of  By: Danae Chen    . Pneumonia 05/20/2012  . Hepatitis     HEPATOMEGALY    Current Outpatient Prescriptions  Medication Sig Dispense Refill  . amLODipine (NORVASC) 10 MG tablet Take 0.5 tablets (5 mg total) by mouth daily. For hypertension  90 tablet  4  . calcium carbonate (TUMS) 500 MG chewable tablet Chew 6 tablets (1,200 mg of elemental calcium total) by mouth 3 (three) times daily. For bone health  90 tablet  3  . cetirizine (ZYRTEC) 10 MG tablet Take 1 tablet (10 mg total) by mouth daily.  90 tablet  3  . ferrous sulfate 325 (65 FE) MG tablet Take 1 tablet (325 mg total) by mouth 2 (two) times daily with a meal.  180 tablet  3  . FLUoxetine (PROZAC) 10 MG capsule Take 1 capsule (10 mg total) by mouth daily. For depression  90 capsule  4  . folic acid (FOLVITE) 1 MG tablet Take  1 tablet (1 mg total) by mouth daily. For folic acid replacement  30 tablet  1  . furosemide (LASIX) 20 MG tablet Take 1 tablet (20 mg total) by mouth daily.  30 tablet  1  . gabapentin (NEURONTIN) 300 MG capsule Take 2 capsules (600 mg total) by mouth 3 (three) times daily. For anxiety/pain control  540 capsule  1  . HYDROcodone-acetaminophen (NORCO/VICODIN) 5-325 MG per tablet Take 1 tablet by mouth every 6 (six) hours as needed for pain.  20 tablet  0  . lipase/protease/amylase (CREON-12/PANCREASE) 12000 UNITS CPEP Take 1 capsule by mouth 3 (three) times daily before meals.  270 capsule  0  . meclizine (ANTIVERT) 50 MG tablet Take 1 tablet (50 mg total) by mouth 3 (three) times daily as needed for dizziness.  30 tablet  0  . Multiple Vitamin (MULTIVITAMIN WITH MINERALS) TABS Take 1 tablet by mouth daily. For vitamin replacement  90 tablet  0  . Nutritional Supplements (ENSURE HIGH PROTEIN PO) Take 1  Can by mouth 3 (three) times daily. chocolate      . potassium chloride SA (KLOR-CON M20) 20 MEQ tablet Take 1 tablet (20 mEq total) by mouth daily. For low potassium  90 tablet  0  . thiamine (VITAMIN B-1) 100 MG tablet Take 1 tablet (100 mg total) by mouth daily. For low thiamine  90 tablet  0  . vitamin B-12 (CYANOCOBALAMIN) 250 MCG tablet Take 1 tablet (250 mcg total) by mouth every evening.  90 tablet  0  . Vitamin D, Ergocalciferol, (DRISDOL) 50000 UNITS CAPS Take 1 capsule (50,000 Units total) by mouth every 7 (seven) days. Thursdays: For bone health  30 capsule    . VOLTAREN 1 % GEL Apply 1 application topically 3 (three) times daily as needed (pain).       Marland Kitchen acetaminophen (TYLENOL) 500 MG tablet Take 500 mg by mouth at bedtime as needed for pain (sleep).      Marland Kitchen aspirin EC 81 MG tablet Take 1 tablet (81 mg total) by mouth every evening.  90 tablet  3  . magnesium oxide (MAG-OX) 400 MG tablet Take 1 tablet (400 mg total) by mouth 3 (three) times daily.  90 tablet  3  . omeprazole (PRILOSEC) 40 MG capsule Take 1 capsule (40 mg total) by mouth daily.  90 capsule  4   No current facility-administered medications for this visit.   Family History  Problem Relation Age of Onset  . Cancer Mother     Died from stomach cancer and "flesh eating rash  . Heart failure Father     Died in 23s from an MI  . Alcohol abuse Sister     Twin sister drinks a lot, as did both her parents and brothers  . Stroke Brother     Has 7 brothers, 1 with CVA   History   Social History  . Marital Status: Divorced    Spouse Name: N/A    Number of Children: N/A  . Years of Education: N/A   Social History Main Topics  . Smoking status: Former Smoker    Types: Cigarettes    Quit date: 09/20/2010  . Smokeless tobacco: Never Used  . Alcohol Use: 6.0 oz/week    10 Glasses of wine per week     Comment: No alocohol x 3 weeks  . Drug Use: No     Comment: HX  USE   . Sexual Activity: Not Currently   Other  Topics  Concern  . None   Social History Narrative   Lives with her significant other and 2 grandchildren. 1 child   Has 7 brothers and 4 sisters, 1 twin sister.   Unemployed, worked in [employer].    Abuses alcohol-drinks 1 glass of wine daily    No drug use. Former cigarette use quit 1.5 years ago.     11 th grade education            Review of Systems: Constitutional: Denies fever, chills, diaphoresis, appetite change and fatigue.  HEENT: Denies photophobia, eye pain, redness, hearing loss, ear pain, congestion, sore throat, rhinorrhea, sneezing, mouth sores, trouble swallowing, neck pain, neck stiffness and tinnitus.   Respiratory: Denies SOB, DOE, cough, chest tightness,  and wheezing.   Cardiovascular: Denies chest pain, palpitations and leg swelling.  Gastrointestinal: Denies nausea, vomiting, abdominal pain, diarrhea, constipation, blood in stool and abdominal distention.  Genitourinary: Denies dysuria, urgency, frequency, hematuria, flank pain and difficulty urinating.  Endocrine: Denies: hot or cold intolerance, sweats, changes in hair or nails, polyuria, polydipsia. Musculoskeletal: Denies myalgias, back pain, joint swelling, arthralgias and gait problem.  Skin: Denies pallor, rash and wound.  Neurological: Denies dizziness, seizures, syncope, weakness, light-headedness, numbness and headaches.  Hematological: Denies adenopathy. Easy bruising, personal or family bleeding history  Psychiatric/Behavioral: Denies suicidal ideation, mood changes, confusion, nervousness, sleep disturbance and agitation  Objective:  Physical Exam: Filed Vitals:   03/20/13 1326  BP: 107/81  Pulse: 89  Temp: 97.1 F (36.2 C)  TempSrc: Oral  Height: 5\' 1"  (1.549 m)  Weight: 87 lb 12.8 oz (39.826 kg)  SpO2: 100%   Constitutional: Vital signs reviewed.  Patient is a well-developed and well-nourished female in no acute distress and cooperative with exam. Alert and oriented x3.  Head:  Normocephalic and atraumatic Mouth: no erythema or exudates, MMM Eyes: PERRL, EOMI, conjunctivae normal, No scleral icterus.  Neck: Supple, Trachea midline normal ROM, No JVD, mass, thyromegaly Cardiovascular: RRR, S1 normal, S2 normal, no MRG, pulses symmetric and intact bilaterally Pulmonary/Chest: normal respiratory effort, CTAB, no wheezes, rales, or rhonchi Abdominal: Soft. Non-tender, non-distended, bowel sounds are normal, no masses, organomegaly, or guarding present.  Musculoskeletal: No joint deformities, erythema, or stiffness, ROM full and no nontender Neurological: A&O x3, Strength is normal and symmetric bilaterally, cranial nerve II-XII are grossly intact, no focal motor deficit, sensory intact to light touch bilaterally.  Skin: Warm, dry and intact. No rash, cyanosis, or clubbing.  Psychiatric: Normal mood and affect. speech and behavior is normal. Judgment and thought content normal. Cognition and memory are normal.   Assessment & Plan:   Please refer to Problem List based Assessment and Plan

## 2013-03-20 NOTE — Assessment & Plan Note (Addendum)
Patient continues to have pain in her left upper shoulder at her proximal humerus fracture. She is tender to palpation exam. She states she is done with physical therapy, but is still having some pain, especially when she lays on her left side at night. She's been taking Vicodin 5-325 mg. I saw her 01/30/13 prescribed 20 more tablets. She requests more pain medication today at a stronger dose. Told her that I would again fill her pain medication while she continues to have pain from her fracture, but this would be the last time. Also check a DEXA scan given that her her fall was standing and she suffered this fracture. - DEXA scan as an outpatient - Vicodin 7.5-325 1 tablet every 6 hours when necessary pain, dispense #30, 0 refills.

## 2013-03-20 NOTE — Assessment & Plan Note (Signed)
Patient continues to abstain from alcohol, and is attending regular AA meetings Monday Wednesday and Friday. She states that she is graduating to the next step within her AA program tomorrow, and she is very excited about this. I am very excited for her! She is to keep up the good work.

## 2013-03-20 NOTE — Assessment & Plan Note (Signed)
Renal function appears improved base of labs from 01/27/13. Patient with medical acidosis with bicarbonate of 15, which has been declining since July. I took the second or 2 her CK D. 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 Rechecking CMP today

## 2013-03-20 NOTE — Assessment & Plan Note (Addendum)
Patient is scheduled to [facility]

## 2013-03-20 NOTE — Assessment & Plan Note (Addendum)
Patient's magnesium was rechecked on 01/30/13 and was low at 1.0. Patient is supposed to be taking magnesium oxide; however she states that she has been noncompliant with this medication as she has many other medications that she is taking daily. Her potassium was normal at 3.7. Discussed the need for patient to continue taking her magnesium oxide, and she acknowledged understanding. She states she'll continue to take the Mag-Ox 4 mg 3 times a day. -Checking CMP

## 2013-03-20 NOTE — Patient Instructions (Addendum)
**Be sure to take your Asprin daily, and please be sure to take your magnesium and calcium daily! **We are checking a bone density test on you given your recent fall causing the arm fracture.   Hypomagnesemia Magnesium is a common ion (mineral) in the body which is needed for metabolism. It is about how the body handles food and other chemical reactions necessary for life. Only about 2% of the magnesium in our body is found in the blood. When this is low, it is called hypomagnesemia. The blood will measure only a tiny amount of the magnesium in our body. When it is low in our blood, it does not mean that the whole body supply is low. The normal serum concentration ranges from 1.8-2.5 mEq/L. When the level gets to be less than 1.0 mEq/L, a number of problems begin to happen.  CAUSES   Receiving intravenous fluids without magnesium replacement.  Loss of magnesium from the bowel by naso-gastric suction.  Loss of magnesium from nausea and vomiting or severe diarrhea. Any of the inflammatory bowel conditions can cause this.  Abuse of alcohol often leads to low serum magnesium.  An inherited form of magnesium loss happens when the kidneys lose magnesium. This is called familial or primary hypomagnesemia.  Some medications such as diuretics also cause the loss of magnesium. SYMPTOMS  These following problems are worse if the changes in magnesium levels come on suddenly.  Tremor.  Confusion.  Muscle weakness.  Over-sensitive to sights and sounds.  Sensitive reflexes.  Depression.  Muscular fibrillations.  Over-reactivity of the nerves.  Irritability.  Psychosis.  Spasms of the hand muscles.  Tetany (where the muscles go into uncontrollable spasms). DIAGNOSIS  This condition can be diagnosed by blood tests. TREATMENT   In emergency, magnesium can be given intravenously (by vein).  If the condition is less worrisome, it can be corrected by diet. High levels of magnesium are  found in green leafy vegetables, peas, beans and nuts among other things. It can also be given through medications by mouth.  If it is being caused by medications, changes can be made.  If alcohol is a problem, help is available if there are difficulties giving it up. Document Released: 02/15/2005 Document Revised: 08/14/2011 Document Reviewed: 01/10/2008 [facility] Patient Information 2014 [place], [place]. Hypocalcemia, Adult Hypocalcemia is low blood calcium. Calcium is important for cells to function in the body. Low blood calcium can cause a variety of symptoms and problems. CAUSES   Low levels of a body protein called albumin.  Problems with the parathyroid glands or surgical removal of the parathyroid glands. The parathyroid glands maintain the body's level of calcium.  Decreased production or improper use of parathyroid hormone.  Lack (deficiency) of vitamin D or magnesium or both.  Intestinal problems that interfere with nutrient absorption.  Alcoholism.  Kidney problems.  Inflammation of the pancreas (pancreatitis).  Certain medicines.  Severe infections (sepsis).  Infiltrative diseases. With these diseases the parathyroid glands are filled with cells or substances that are not normally present. Examples include:  Sarcoidosis.  Hemachromatosis.  Breakdown of large amounts of muscle fiber.  High levels of phosphate in the body.  Cancer.  Massive blood transfusions which usually occur with severe trauma. SYMPTOMS   Numbness and tingling in the fingers, toes, or around the mouth.  Muscle aches or cramps, especially in the legs, feet, and back.  Muscle twitches.  Shortness of breath or wheezing.  Difficulty swallowing.  Changes in the sound of the voice.  General weakness.  Fainting.  Fast heart beats (palpitations).  Chest pain.  Irritability.  Difficulty thinking.  Memory problems or confusion.  Severe fatigue.  Changes in  personality.  Depression and anxiety.  Shaking uncontrollably (seizures).  Coarse, brittle hair and nails.  Dry skin or lasting (chronic) skin diseases (psoriasis, eczema, or dermatitis).  Clouding of the eye lens (cataracts).  Abdominal cramping or pain. DIAGNOSIS  Hypocalcemia is usually diagnosed through blood tests that reveal a low level of blood calcium. Other tests, such as a recording of the electrical activity of the heart (electrocardiogram, EKG), may be performed in order to diagnose the underlying cause of the condition. TREATMENT  Treatment for hypocalcemia includes giving calcium supplements. These can be given by mouth or by intravenous (IV) access tube, depending on the severity of the symptoms and deficiency. Other minerals (electrolytes), such as magnesium, may also be given. HOME CARE INSTRUCTIONS   Meet with a dietitian to make sure you are eating the most healthful diet possible, or follow diet instructions as directed by your caregiver.  Follow up with your caregiver as directed. SEEK IMMEDIATE MEDICAL CARE IF:   You develop chest pain.  You develop persistent rapid or irregular heartbeats.  You have difficulty breathing.  You faint.  You develop increased fatigue.  You have new swelling in the feet, ankles, or legs.  You develop increased muscle twitching.  You start to have seizures.  You develop confusion.  You develop mood, memory, or personality changes. MAKE SURE YOU:   Understand these instructions.  Will watch your condition.  Will get help right away if you are not doing well or get worse. Document Released: 11/09/2009 Document Revised: 08/14/2011 Document Reviewed: 11/09/2009 [facility] Patient Information 2014 [place], [place]. Hypocalcemia, Adult Hypocalcemia is low blood calcium. Calcium is important for cells to function in the body. Low blood calcium can cause a variety of symptoms and problems. CAUSES   Low levels of a body  protein called albumin.  Problems with the parathyroid glands or surgical removal of the parathyroid glands. The parathyroid glands maintain the body's level of calcium.  Decreased production or improper use of parathyroid hormone.  Lack (deficiency) of vitamin D or magnesium or both.  Intestinal problems that interfere with nutrient absorption.  Alcoholism.  Kidney problems.  Inflammation of the pancreas (pancreatitis).  Certain medicines.  Severe infections (sepsis).  Infiltrative diseases. With these diseases the parathyroid glands are filled with cells or substances that are not normally present. Examples include:  Sarcoidosis.  Hemachromatosis.  Breakdown of large amounts of muscle fiber.  High levels of phosphate in the body.  Cancer.  Massive blood transfusions which usually occur with severe trauma. SYMPTOMS   Numbness and tingling in the fingers, toes, or around the mouth.  Muscle aches or cramps, especially in the legs, feet, and back.  Muscle twitches.  Shortness of breath or wheezing.  Difficulty swallowing.  Changes in the sound of the voice.  General weakness.  Fainting.  Fast heart beats (palpitations).  Chest pain.  Irritability.  Difficulty thinking.  Memory problems or confusion.  Severe fatigue.  Changes in personality.  Depression and anxiety.  Shaking uncontrollably (seizures).  Coarse, brittle hair and nails.  Dry skin or lasting (chronic) skin diseases (psoriasis, eczema, or dermatitis).  Clouding of the eye lens (cataracts).  Abdominal cramping or pain. DIAGNOSIS  Hypocalcemia is usually diagnosed through blood tests that reveal a low level of blood calcium. Other tests, such as a recording of the  electrical activity of the heart (electrocardiogram, EKG), may be performed in order to diagnose the underlying cause of the condition. TREATMENT  Treatment for hypocalcemia includes giving calcium supplements. These can  be given by mouth or by intravenous (IV) access tube, depending on the severity of the symptoms and deficiency. Other minerals (electrolytes), such as magnesium, may also be given. HOME CARE INSTRUCTIONS   Meet with a dietitian to make sure you are eating the most healthful diet possible, or follow diet instructions as directed by your caregiver.  Follow up with your caregiver as directed. SEEK IMMEDIATE MEDICAL CARE IF:   You develop chest pain.  You develop persistent rapid or irregular heartbeats.  You have difficulty breathing.  You faint.  You develop increased fatigue.  You have new swelling in the feet, ankles, or legs.  You develop increased muscle twitching.  You start to have seizures.  You develop confusion.  You develop mood, memory, or personality changes. MAKE SURE YOU:   Understand these instructions.  Will watch your condition.  Will get help right away if you are not doing well or get worse. Document Released: 11/09/2009 Document Revised: 08/14/2011 Document Reviewed: 11/09/2009 [facility] Patient Information 2014 [place], [place].

## 2013-03-20 NOTE — Assessment & Plan Note (Signed)
Calcium on A./20/14 was he 0.2, down from a 0.5 on 01/15/13. Patient is to be taking calcium and vitamin D supplementation. However she is not been completely compliant with the calcium. Encouraged patient to resume taking her calcium as prescribed - Checking CMP today

## 2013-03-20 NOTE — Assessment & Plan Note (Addendum)
Patient bicarbonate has been progressively declining since July 2014. At last check on 01/22/2013 it was 15. Unsure why she's becoming more acidotic. 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 Roe Coombs' think this is 2/2 starvation as she has no AG. Will recheck a CMP and UA with urine sodium and ammonia levels. May need to treat with supplemental bicarbonate, and possibly obtain a Renal consult. - CMP - UA, urine sodium and ammonia levels

## 2013-03-24 NOTE — Progress Notes (Signed)
Case discussed with Dr. Glenn soon after the resident saw the patient.  We reviewed the resident's history and exam and pertinent patient test results.  I agree with the assessment, diagnosis, and plan of care documented in the resident's note. 

## 2013-03-26 ENCOUNTER — Other Ambulatory Visit: Payer: Self-pay | Admitting: *Deleted

## 2013-03-26 NOTE — Telephone Encounter (Signed)
Check directions Thanks

## 2013-03-27 NOTE — Telephone Encounter (Signed)
Pharmacy informed of denial

## 2013-04-03 ENCOUNTER — Ambulatory Visit
Admission: RE | Admit: 2013-04-03 | Discharge: 2013-04-03 | Disposition: A | Discharge status: Home / Self Care | Payer: Medicaid Other | Source: Ambulatory Visit

## 2013-04-03 DIAGNOSIS — Z1231 Encounter for screening mammogram for malignant neoplasm of breast: Secondary | ICD-10-CM

## 2013-04-04 ENCOUNTER — Other Ambulatory Visit: Payer: Self-pay | Admitting: *Deleted

## 2013-04-08 MED ORDER — DICLOFENAC SODIUM 1 % TD GEL: 1.0000 "application " | Freq: Three times a day (TID) | TRANSDERMAL | Status: DC | PRN

## 2013-04-10 ENCOUNTER — Other Ambulatory Visit: Payer: Self-pay | Admitting: *Deleted

## 2013-04-10 MED ORDER — ADULT MULTIVITAMIN W/MINERALS CH: 1.0000 | ORAL_TABLET | Freq: Every day | ORAL | Status: DC

## 2013-04-10 MED ORDER — CYANOCOBALAMIN 250 MCG PO TABS: 250.0000 ug | ORAL_TABLET | Freq: Every evening | ORAL | Status: DC

## 2013-04-10 MED ORDER — PANCRELIPASE (LIP-PROT-AMYL) 12000-38000 UNITS PO CPEP: 1.0000 | ORAL_CAPSULE | Freq: Three times a day (TID) | ORAL | Status: DC

## 2013-04-21 ENCOUNTER — Encounter: Payer: Self-pay | Admitting: Internal Medicine

## 2013-04-30 ENCOUNTER — Ambulatory Visit: Payer: Self-pay | Admitting: Internal Medicine

## 2013-05-07 ENCOUNTER — Other Ambulatory Visit: Payer: Self-pay | Admitting: *Deleted

## 2013-05-21 ENCOUNTER — Encounter: Payer: Self-pay | Admitting: Internal Medicine

## 2013-05-21 ENCOUNTER — Ambulatory Visit (INDEPENDENT_AMBULATORY_CARE_PROVIDER_SITE_OTHER): Payer: PRIVATE HEALTH INSURANCE | Admitting: Internal Medicine

## 2013-05-21 VITALS — BP 124/90 | HR 97 | Temp 96.6°F | Ht 62.0 in | Wt 83.3 lb

## 2013-05-21 DIAGNOSIS — I10 Essential (primary) hypertension: Secondary | ICD-10-CM

## 2013-05-21 DIAGNOSIS — E872 Acidosis, unspecified: Secondary | ICD-10-CM

## 2013-05-21 DIAGNOSIS — I129 Hypertensive chronic kidney disease with stage 1 through stage 4 chronic kidney disease, or unspecified chronic kidney disease: Secondary | ICD-10-CM

## 2013-05-21 DIAGNOSIS — Z0000 Encounter for general adult medical examination without abnormal findings: Secondary | ICD-10-CM

## 2013-05-21 DIAGNOSIS — N183 Chronic kidney disease, stage 3 unspecified: Secondary | ICD-10-CM

## 2013-05-21 DIAGNOSIS — D649 Anemia, unspecified: Secondary | ICD-10-CM

## 2013-05-21 DIAGNOSIS — F1010 Alcohol abuse, uncomplicated: Secondary | ICD-10-CM

## 2013-05-21 DIAGNOSIS — R634 Abnormal weight loss: Secondary | ICD-10-CM

## 2013-05-21 LAB — CBC
HCT: 29 % — ABNORMAL LOW (ref 36.0–46.0)
Hemoglobin: 9.6 g/dL — ABNORMAL LOW (ref 12.0–15.0)
MCH: 33.8 pg (ref 26.0–34.0)
MCV: 102.1 fL — ABNORMAL HIGH (ref 78.0–100.0)
Platelets: 109 10*3/uL — ABNORMAL LOW (ref 150–400)
RBC: 2.84 MIL/uL — ABNORMAL LOW (ref 3.87–5.11)
WBC: 5.2 10*3/uL (ref 4.0–10.5)

## 2013-05-21 LAB — COMPLETE METABOLIC PANEL WITH GFR
ALT: 72 U/L — ABNORMAL HIGH (ref 0–35)
Albumin: 2.7 g/dL — ABNORMAL LOW (ref 3.5–5.2)
BUN: 12 mg/dL (ref 6–23)
CO2: 14 mEq/L — ABNORMAL LOW (ref 19–32)
Calcium: 7.1 mg/dL — ABNORMAL LOW (ref 8.4–10.5)
Chloride: 106 mEq/L (ref 96–112)
Creat: 1.24 mg/dL — ABNORMAL HIGH (ref 0.50–1.10)
GFR, Est African American: 56 mL/min — ABNORMAL LOW
GFR, Est Non African American: 49 mL/min — ABNORMAL LOW
Glucose, Bld: 128 mg/dL — ABNORMAL HIGH (ref 70–99)
Potassium: 4 mEq/L (ref 3.5–5.3)

## 2013-05-21 NOTE — Progress Notes (Signed)
Case discussed with Dr. Glenn at the time of the visit.  We reviewed the resident's history and exam and pertinent patient test results.  I agree with the assessment, diagnosis, and plan of care documented in the resident's note.   

## 2013-05-21 NOTE — Assessment & Plan Note (Signed)
Patient's currently taking iron supplementation 3 times a day. Her last CBC was in August 2014, with a hemoglobin of 10.7.  - Checking CBC today

## 2013-05-21 NOTE — Progress Notes (Signed)
[name], female   DOB: March 10, 1956, 57 y.o.   MRN: [id number]  Subjective:   [name] female   DOB: 1955/10/20 57 y.o.   MRN: [id number]  HPI: [name] is a 57 y.o. F with PMH CKD, HTN, EtOH abuse, and new onset non- anion gap metabolic acidosis presents to the clinic for a routine follow up of her medical conditions.  She was last seen on 10/16 by me and was doing well at that visit. However in looking back at her previous labs, she was found to have a non-anion gap acidosis, which was thought to be 2/2 to her CKD vs RTA. A CMP, UA, urine NA, osmols, and ammonium levels were all to be checked but were not obtained.   She had her mammogram performed on 04/04/13 which was normal, repeat recommended in 1 year.   She states that she is still abstaining from EtOH and is attending AA classes TID.   Her weight is down 4lbs today. The pts states that her whole household had a stomach virus recently, including herself, which she attributes to her weight loss. She is still drinking her Ensure shakes.   Pt states that she purchased a pill box to help her remember to take her medications as scheduled.   Past Medical History  Diagnosis Date  . Anemia, B12 deficiency   . History of acute pancreatitis   . Right knee pain     No recent imaging on chart  . 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 Dr. Su [name], [name] [name]  . Hypertriglyceridemia   . GERD (gastroesophageal reflux disease)   . Vitamin D deficiency   . Subdural hematoma 02/2008    Likely 2/2 trauma from seizure from EtOH withdrawal, chronic in nature, sees Dr. Robyne [name]. Most recent CT head 10/2009 showing stable but persistent hematoma without mass effect.  . History of seizure disorder     Likely 2/2 alcohol abuse  . Hypocalcemia   . Hypomagnesemia   . Failure to thrive in childhood     Unclear etiology  . HTN  (hypertension)   . Thrombocytopenia   . Anemia, macrocytic   . Hepatomegaly     On exam  . Alcohol abuse   . Joint pain   . Alcohol abuse   . Arthritis   . Vitamin D deficiency   . Menopause   . Pancreatitis   . Insomnia   . Hyperlipidemia   . Sinusitis   . Pernicious anemia   . Subdural hematoma   . Macrocytic anemia   . Hepatomegaly   . 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 Seizures     1.5 YRS  LAST ONE  . Depression   . Fx humeral neck 04/17/2011    Transverse fracture- minimally displaced- managed as outpatient   . ABNORMAL PAP SMEAR, LGSIL 07/23/2008    Annotation: HPV positive CIN I Dr. Su [name], [facility] for [name] Qualifier: Diagnosis of  By: [name] [name]    . Pneumonia 05/20/2012  . Hepatitis     HEPATOMEGALY    Current Outpatient Prescriptions  Medication Sig Dispense Refill  . acetaminophen (TYLENOL) 500 MG tablet Take 500 mg by mouth at bedtime as needed for pain (sleep).      Marland Kitchen amLODipine (NORVASC) 10 MG tablet Take 0.5 tablets (5 mg total) by mouth daily. For hypertension  90 tablet  4  . aspirin EC 81 MG tablet Take 1 tablet (81 mg total) by mouth every evening.  90 tablet  3  . calcium carbonate (TUMS) 500 MG chewable tablet Chew 6 tablets (1,200 mg of elemental calcium total) by mouth 3 (three) times daily. For bone health  90 tablet  3  . cetirizine (ZYRTEC) 10 MG tablet Take 1 tablet (10 mg total) by mouth daily.  90 tablet  3  . diclofenac sodium (VOLTAREN) 1 % GEL Apply 1 application topically 3 (three) times daily as needed (pain). Apply 2 grams to affected area 4 times daily as needed  100 g  0  . ferrous sulfate 325 (65 FE) MG tablet Take 1 tablet (325 mg total) by mouth 2 (two) times daily with a meal.  180 tablet  3  . FLUoxetine (PROZAC) 10 MG capsule Take 1 capsule (10 mg total) by mouth daily. For depression  90 capsule  4  . folic acid (FOLVITE) 1 MG tablet Take 1 tablet (1 mg total) by mouth daily. For folic acid replacement  30  tablet  1  . furosemide (LASIX) 20 MG tablet Take 1 tablet (20 mg total) by mouth daily.  30 tablet  1  . gabapentin (NEURONTIN) 300 MG capsule Take 2 capsules (600 mg total) by mouth 3 (three) times daily. For anxiety/pain control  540 capsule  1  . HYDROcodone-acetaminophen (NORCO) 7.5-325 MG per tablet Take 1 tablet by mouth every 6 (six) hours as needed for pain.  30 tablet  0  . lipase/protease/amylase (CREON-12/PANCREASE) 12000 UNITS CPEP capsule Take 1 capsule by mouth 3 (three) times daily before meals.  270 capsule  3  . magnesium oxide (MAG-OX) 400 MG tablet Take 1 tablet (400 mg total) by mouth 3 (three) times daily.  90 tablet  3  . meclizine (ANTIVERT) 50 MG tablet Take 1 tablet (50 mg total) by mouth 3 (three) times daily as needed for dizziness.  30 tablet  0  . Multiple Vitamin (MULTIVITAMIN WITH MINERALS) TABS tablet Take 1 tablet by mouth daily. For vitamin replacement  90 tablet  3  . Nutritional Supplements (ENSURE HIGH PROTEIN PO) Take 1 Can by mouth 3 (three) times daily. chocolate      . omeprazole (PRILOSEC) 40 MG capsule Take 1 capsule (40 mg total) by mouth daily.  90 capsule  4  . potassium chloride SA (KLOR-CON M20) 20 MEQ tablet Take 1 tablet (20 mEq total) by mouth daily. For low potassium  90 tablet  0  . thiamine (VITAMIN B-1) 100 MG tablet Take 1 tablet (100 mg total) by mouth daily. For low thiamine  90 tablet  0  . vitamin B-12 (CYANOCOBALAMIN) 250 MCG tablet Take 1 tablet (250 mcg total) by mouth every evening.  90 tablet  3  . Vitamin D, Ergocalciferol, (DRISDOL) 50000 UNITS CAPS Take 1 capsule (50,000 Units total) by mouth every 7 (seven) days. Thursdays: For bone health  30 capsule     No current facility-administered medications for this visit.   Family History  Problem Relation Age of Onset  . Cancer Mother     Died from stomach cancer and "flesh eating rash  . Heart failure Father     Died in 23s from an MI  . Alcohol abuse Sister     Twin sister  drinks a lot, as did both her parents and brothers  . Stroke Brother     Has 7 brothers, 1 with CVA  History   Social History  . Marital Status: Divorced    Spouse Name: N/A    Number of Children: N/A  . Years of Education: N/A   Social History Main Topics  . Smoking status: Former Smoker    Types: Cigarettes    Quit date: 09/20/2010  . Smokeless tobacco: Never Used  . Alcohol Use: 6.0 oz/week    10 Glasses of wine per week     Comment: No alocohol x 3 weeks  . Drug Use: No     Comment: HX  USE   . Sexual Activity: Not Currently   Other Topics Concern  . None   Social History Narrative   Lives with her significant other and 2 grandchildren. 1 child   Has 7 brothers and 4 sisters, 1 twin sister.   Unemployed, worked in [employer].    Abuses alcohol-drinks 1 glass of wine daily    No drug use. Former cigarette use quit 1.5 years ago.     11 th grade education            Review of Systems: A 12 point ROS was performed; pertinent positives and negatives were noted in the HPI   Objective:  Physical Exam: Filed Vitals:   05/21/13 1334  BP: 124/90  Pulse: 97  Temp: 96.6 F (35.9 C)  TempSrc: Oral  Height: 5\' 2"  (1.575 m)  Weight: 83 lb 4.8 oz (37.785 kg)  SpO2: 96%   Constitutional: Vital signs reviewed.  Patient is a cachectic female in no acute distress and cooperative with exam. Head: Normocephalic and atraumatic Eyes: PERRL, EOMI Neck: Supple, Trachea midline Cardiovascular: Tachycardic, regular rhythm, no MRG, pulses symmetric and intact bilaterally Pulmonary/Chest: Normal respiratory effort, CTAB, no wheezes, rales, or rhonchi Abdominal: Soft. Non-tender, non-distended, bowel sounds are normal Musculoskeletal: No joint deformities, erythema, or stiffness Neurological: A&O x3, non focal Skin: Warm, dry and intact. Psychiatric: Normal mood and affect. speech and behavior is normal.  Assessment & Plan:   Please refer to Problem List based Assessment  and Plan

## 2013-05-21 NOTE — Assessment & Plan Note (Signed)
Patient's weight is down 4 pounds today from October. She states that she's been sick recently with a stomach virus which unfortunately affected the whole house. She states that she is drinking her Ensure. I asked if she would like to speak with Norm Parcel, our nutritionist for help regarding healthy ways to put on weight. The patient declined at this time. She states that she thinks her weight loss was due to her recent illness, and thinks she will gain the weight back. Will have to keep close followup on [name] [name] to make sure that she continues to gain weight instead of losing it.

## 2013-05-21 NOTE — Patient Instructions (Signed)
Return to the clinic on Monday, 05/26/13, and we will discuss the results of your labwork at that time.

## 2013-05-21 NOTE — Assessment & Plan Note (Signed)
Lab work was not done at her last visit in October. Rechecking CMP, UA, urine sodium, osmoles, potassium, and chloride today to assess acid/base status to assess renal function and to rule out RTA. She's to followup on 12/22 2 this her lab results.

## 2013-05-21 NOTE — Assessment & Plan Note (Addendum)
Patient continues to abstain from alcohol. She is going to her group sessions 3 times a week. She does endorse occasional cravings for alcohol, but states she has a counselor who she calls when the cravings occur.  Addendum: 05/27/13: Elevated LFTs form 12/17. I spoke with the patient today to notify her of her lab results. She denies any further EtOH use. LFTs should be repeated at her next visit. Consider checking for EtOH use as well.

## 2013-05-21 NOTE — Assessment & Plan Note (Signed)
Unfortunately, labs were not performed at her last visit; today is the first time she's come in since her last visit 03/20/13. CMP today to assess renal function and acidosis. May need to refer the patient to nephrology pending lab results.

## 2013-05-21 NOTE — Assessment & Plan Note (Signed)
BP Readings from Last 3 Encounters:  05/21/13 124/90  03/20/13 107/81  01/30/13 112/77    Lab Results  Component Value Date   NA 133* 01/30/2013   K 3.7 01/30/2013   CREATININE 1.59* 01/30/2013    Assessment: Blood pressure control: controlled Progress toward BP goal:  at goal   Plan: Medications:  continue current medications Educational resources provided: brochure;handout;video Self management tools provided:   Other plans: Checking CMP today

## 2013-05-22 LAB — URINALYSIS, ROUTINE W REFLEX MICROSCOPIC
Bilirubin Urine: NEGATIVE
Ketones, ur: NEGATIVE mg/dL
Nitrite: NEGATIVE
Protein, ur: 100 mg/dL — AB
Urobilinogen, UA: 0.2 mg/dL (ref 0.0–1.0)

## 2013-05-22 LAB — URINALYSIS, MICROSCOPIC ONLY
Casts: NONE SEEN
Crystals: NONE SEEN

## 2013-05-22 LAB — OSMOLALITY, URINE: Osmolality, Ur: 449 mOsm/kg (ref 390–1090)

## 2013-05-22 NOTE — Addendum Note (Signed)
Addended by: Remus Blake on: 05/22/2013 01:40 PM   Modules accepted: Orders

## 2013-05-27 MED ORDER — MAGNESIUM OXIDE 400 MG PO TABS: 400.0000 mg | ORAL_TABLET | Freq: Four times a day (QID) | ORAL | Status: DC

## 2013-05-27 NOTE — Addendum Note (Signed)
Addended by: Charlsie Merles F on: 05/27/2013 12:14 PM   Modules accepted: Orders

## 2013-05-27 NOTE — Assessment & Plan Note (Addendum)
Pt with previous Mg of 1.0 on 01/30/13. Pt on Magnesium Oxide 400mg  TID which she has had poor compliance with in the past but endorsed compliance with on 12/17. Repeat Mg on 12/17 was 0.9. Will increase her MgOxide to 800mg  BID. Pt notified of this increase and the potential for diarrhea. I am concerned that she may be using EtOH again, especially given her normal potassium levels, which is causing her Magnesium to remain depleted. However, she did recently have an acute diarrheal illness, which could account for some of her Mg deficit. She is to f/u next week for an EKG, lab work, and f/u for her hypomagnesia. Pt notified and has an appointment on 12/29 at 10:15am with Dr. Bosie Clos.

## 2013-06-02 ENCOUNTER — Ambulatory Visit (INDEPENDENT_AMBULATORY_CARE_PROVIDER_SITE_OTHER): Payer: PRIVATE HEALTH INSURANCE | Admitting: Internal Medicine

## 2013-06-02 ENCOUNTER — Other Ambulatory Visit: Payer: Self-pay | Admitting: Internal Medicine

## 2013-06-02 ENCOUNTER — Encounter: Payer: Self-pay | Admitting: Internal Medicine

## 2013-06-02 DIAGNOSIS — F1010 Alcohol abuse, uncomplicated: Secondary | ICD-10-CM

## 2013-06-02 DIAGNOSIS — E872 Acidosis, unspecified: Secondary | ICD-10-CM

## 2013-06-02 DIAGNOSIS — N183 Chronic kidney disease, stage 3 unspecified: Secondary | ICD-10-CM

## 2013-06-02 LAB — ETHANOL: Alcohol, Ethyl (B): 52 mg/dL — ABNORMAL HIGH (ref 0–10)

## 2013-06-02 LAB — COMPLETE METABOLIC PANEL WITH GFR
ALT: 23 U/L (ref 0–35)
AST: 111 U/L — ABNORMAL HIGH (ref 0–37)
Albumin: 2.3 g/dL — ABNORMAL LOW (ref 3.5–5.2)
Alkaline Phosphatase: 468 U/L — ABNORMAL HIGH (ref 39–117)
CO2: 15 mEq/L — ABNORMAL LOW (ref 19–32)
Creat: 1.24 mg/dL — ABNORMAL HIGH (ref 0.50–1.10)
GFR, Est African American: 56 mL/min — ABNORMAL LOW
GFR, Est Non African American: 49 mL/min — ABNORMAL LOW
Glucose, Bld: 167 mg/dL — ABNORMAL HIGH (ref 70–99)
Total Bilirubin: 0.5 mg/dL (ref 0.3–1.2)
Total Protein: 8.9 g/dL — ABNORMAL HIGH (ref 6.0–8.3)

## 2013-06-02 LAB — HEPATITIS PANEL, ACUTE
HCV Ab: NEGATIVE
Hep A IgM: NONREACTIVE
Hep B C IgM: NONREACTIVE

## 2013-06-02 LAB — GAMMA GT: GGT: 510 U/L — ABNORMAL HIGH (ref 7–51)

## 2013-06-02 NOTE — Assessment & Plan Note (Signed)
Reports sobriety, check ETOH level today

## 2013-06-02 NOTE — Progress Notes (Signed)
Case discussed with Dr. Schooler at time of visit.  We reviewed the resident's history and exam and pertinent patient test results.  I agree with the assessment, diagnosis, and plan of care documented in the resident's note. 

## 2013-06-02 NOTE — Progress Notes (Addendum)
   Subjective:    [name], female    DOB: [birth year], 57 y.o.   MRN: [id number]  HPI Pt with hx significant for chronic kidney disease Stage 3, normal anion gap metabolic acidosis, alcohol abuse, polyclonal gammopathy, elevated liver enzymes, and elctrolyte abnormalities including hypomagnesia and hypocalcemia who presents for follow-up.  Pt appt scheduled at 10:15, seen by MD at 10:21 but states that she thought appt was for labs only and cannot stay for thorough evaluation.   Review of Systems  Constitutional: Negative.   HENT: Negative.   Eyes: Negative.   Respiratory: Negative.   Cardiovascular: Negative.   Gastrointestinal: Negative.   Endocrine: Negative.   Genitourinary: Negative.   Allergic/Immunologic: Negative.   Neurological: Negative.   Hematological: Negative.   Psychiatric/Behavioral: Negative.        Objective:   Physical Exam  Constitutional: She is oriented to person, place, and time. She appears well-developed and well-nourished. No distress.  HENT:  Head: Normocephalic and atraumatic.  Eyes: Conjunctivae and EOM are normal. Pupils are equal, round, and reactive to light.  Cardiovascular: Normal rate.   Pulmonary/Chest: Effort normal.  Neurological: She is alert and oriented to person, place, and time.  Psychiatric: She has a normal mood and affect.          Assessment & Plan:  See separate problem list charting:   #1 elevated liver enzymes: AST 554, ALT 22, Tbili wnl thus bilary tract obstruction less likely, not on statin therapy ddx hepatitis vs cirrhosis - recheck CMET, check GGT to r/o bone source, check Hep Panel  #2 normal anion gap metabolic acidosis: pt with calculated anion gap of 17 on last evaluation -CMET today CMP     Component Value Date/Time   NA 133* 06/02/2013 1038   K 3.9 06/02/2013 1038   CL 101 06/02/2013 1038   CO2 15* 06/02/2013 1038   GLUCOSE 167* 06/02/2013 1038   BUN 13 06/02/2013 1038   CREATININE 1.24* 06/02/2013 1038   CREATININE 1.71* 01/15/2013 1230   CALCIUM 6.4* 06/02/2013 1038   CALCIUM 5.6* 12/22/2010 1010   PROT 8.9* 06/02/2013 1038   ALBUMIN 2.3* 06/02/2013 1038   AST 111* 06/02/2013 1038   ALT 23 06/02/2013 1038   ALKPHOS 468* 06/02/2013 1038   BILITOT 0.5 06/02/2013 1038   GFRNONAA 33* 01/15/2013 1230   GFRAA 38* 01/15/2013 1230      #3 CKD: Creatinine improved on last check from 1.6-1.2 -check Creatinine today -check UA as last UA not clean catch  #4 hypomagnesia: MgOx 400 mg qid on med list but clinic note instruct 800 mg bid -check mg level today  #5 hypocalcemia: no spasms or seizures, corrected calcium level 7.8 -check ionized Ca today -pt declines EKG today  #6 h/o ETOH abuse: check etoh level today  #7 Dispo: pt to return in 1 week for f/u

## 2013-06-02 NOTE — Assessment & Plan Note (Signed)
Should be on MgOx 800 bid per last clinic note but med list with MgOx 400 qid, did not bring meds today -check Mg level today -clarify regimen on f/u

## 2013-06-02 NOTE — Assessment & Plan Note (Addendum)
Check ionized ca today, declines EKG recommended by PCP

## 2013-06-03 LAB — URINALYSIS, ROUTINE W REFLEX MICROSCOPIC
Bilirubin Urine: NEGATIVE
Glucose, UA: NEGATIVE mg/dL
Ketones, ur: NEGATIVE mg/dL
Nitrite: NEGATIVE
Specific Gravity, Urine: 1.011 (ref 1.005–1.030)
Urobilinogen, UA: 1 mg/dL (ref 0.0–1.0)
pH: 5.5 (ref 5.0–8.0)

## 2013-06-03 LAB — URINALYSIS, MICROSCOPIC ONLY
Crystals: NONE SEEN
Squamous Epithelial / LPF: NONE SEEN

## 2013-06-10 ENCOUNTER — Encounter: Payer: Self-pay | Admitting: Internal Medicine

## 2013-06-10 ENCOUNTER — Ambulatory Visit (HOSPITAL_COMMUNITY)
Admission: RE | Admit: 2013-06-10 | Discharge: 2013-06-10 | Disposition: A | Discharge status: Home / Self Care | Payer: PRIVATE HEALTH INSURANCE | Source: Ambulatory Visit | Attending: Internal Medicine | Admitting: Internal Medicine

## 2013-06-10 ENCOUNTER — Ambulatory Visit (INDEPENDENT_AMBULATORY_CARE_PROVIDER_SITE_OTHER): Payer: Medicaid Other | Admitting: Internal Medicine

## 2013-06-10 DIAGNOSIS — F1010 Alcohol abuse, uncomplicated: Secondary | ICD-10-CM

## 2013-06-10 DIAGNOSIS — E872 Acidosis, unspecified: Secondary | ICD-10-CM

## 2013-06-10 DIAGNOSIS — N183 Chronic kidney disease, stage 3 unspecified: Secondary | ICD-10-CM

## 2013-06-10 DIAGNOSIS — E8729 Other acidosis: Secondary | ICD-10-CM

## 2013-06-10 DIAGNOSIS — R6889 Other general symptoms and signs: Secondary | ICD-10-CM | POA: Insufficient documentation

## 2013-06-10 DIAGNOSIS — R748 Abnormal levels of other serum enzymes: Secondary | ICD-10-CM

## 2013-06-10 LAB — CBC
HCT: 30.1 % — ABNORMAL LOW (ref 36.0–46.0)
HEMOGLOBIN: 10.1 g/dL — AB (ref 12.0–15.0)
MCH: 34.2 pg — AB (ref 26.0–34.0)
MCHC: 33.6 g/dL (ref 30.0–36.0)
MCV: 102 fL — AB (ref 78.0–100.0)
Platelets: 252 10*3/uL (ref 150–400)
RBC: 2.95 MIL/uL — ABNORMAL LOW (ref 3.87–5.11)
RDW: 13.5 % (ref 11.5–15.5)
WBC: 6.2 10*3/uL (ref 4.0–10.5)

## 2013-06-10 LAB — COMPLETE METABOLIC PANEL WITH GFR
ALBUMIN: 2.5 g/dL — AB (ref 3.5–5.2)
ALK PHOS: 522 U/L — AB (ref 39–117)
ALT: 24 U/L (ref 0–35)
AST: 112 U/L — AB (ref 0–37)
BILIRUBIN TOTAL: 0.3 mg/dL (ref 0.3–1.2)
BUN: 14 mg/dL (ref 6–23)
CO2: 16 mEq/L — ABNORMAL LOW (ref 19–32)
Calcium: 7.5 mg/dL — ABNORMAL LOW (ref 8.4–10.5)
Chloride: 101 mEq/L (ref 96–112)
Creat: 1.32 mg/dL — ABNORMAL HIGH (ref 0.50–1.10)
GFR, EST NON AFRICAN AMERICAN: 45 mL/min — AB
GFR, Est African American: 52 mL/min — ABNORMAL LOW
GLUCOSE: 110 mg/dL — AB (ref 70–99)
POTASSIUM: 4.6 meq/L (ref 3.5–5.3)
SODIUM: 136 meq/L (ref 135–145)
Total Protein: 9.5 g/dL — ABNORMAL HIGH (ref 6.0–8.3)

## 2013-06-10 LAB — MAGNESIUM: MAGNESIUM: 1 mg/dL — AB (ref 1.5–2.5)

## 2013-06-10 MED ORDER — BD VINYL GLOVES LARGE/X-LARGE MISC: 1.0000 | Freq: Once | Status: DC

## 2013-06-10 MED ORDER — CAPSAICIN 0.025 % EX CREA
TOPICAL_CREAM | Freq: Two times a day (BID) | CUTANEOUS | Status: DC
Start: 2013-06-10 — End: 2013-10-22

## 2013-06-10 NOTE — Assessment & Plan Note (Signed)
Her Calcium is 7.5 but her corrected Calcium is 8.7.  She should continue her calcium supplement.

## 2013-06-10 NOTE — Patient Instructions (Addendum)
1. Please continue taking your calcium and magnesium supplements as prescribed.  Be sure to eat balanced meals.  Congratulations on quitting drinking.  Staying away from alcohol will improved your overall health.    2. Stop taking Tylenol and medications containing Tylenol since they can harm your liver.  You will be contacted by our office to schedule an appointment for an ultrasound of your abdomen so we can get a better look at your liver.  This will help [place] find out what is causing your lab abnormalities.   3. Please take all medications as prescribed. Try Voltaren gel or Capsaicin cream for your shoulder.   4. If you have worsening of your symptoms or new symptoms arise, please call the clinic ([phone]), or go to the ER immediately if symptoms are severe.  5. Return to office in two weeks for repeat labs and review of your abdominal ultrasound results.

## 2013-06-10 NOTE — Progress Notes (Signed)
   Subjective:    [name], female    DOB: 1956-04-08, 58 y.o.   MRN: [id number]  HPI Comments: [name] [name] is a 58 year old with a history or HTN, CKD3, EtOH abuse, protein calorie malnutrition, hypocalcemia and hypomagnesemia who presents for results of recent lab-work.  Her 12/29 labs have several abnormalities including elevated liver enzymes, AG 17, a low Mg and a low corrected Ca.  She denies CP, SOB, decreased po, N/V, diarrhea, abdominal pain, muscle cramps or spasms.  She says she is in [place] and another group called [facility] for the past 8 months.  She says she had one drink at Christmas but denies drinking since.  She is in a hurry to leave because she needs to take her husband to an appointment.  She agrees to have repeat blood work today.  I asked [name] [name] to see me in the afternoon when she returned for her husband's appointment so I could review her labs.  She was informed that if they continued to be grossly abnormal she might require hospitalization.  She agreed to see me in the afternoon but left before I could speak with her.     Review of Systems  Constitutional: Negative for fever, chills and appetite change.  HENT: Negative for rhinorrhea, sneezing and sore throat.   Respiratory: Negative for shortness of breath.   Cardiovascular: Negative for chest pain and palpitations.  Gastrointestinal: Negative for abdominal pain, diarrhea and constipation.  Genitourinary: Positive for frequency. Negative for dysuria and flank pain.  Musculoskeletal:       Negative for muscle cramps/spasms  Neurological: Negative for seizures and headaches.       Objective:   Physical Exam  Vitals reviewed. Constitutional: She is oriented to person, place, and time. No distress.  Very thin.  Cardiovascular: Normal rate, regular rhythm and normal heart sounds.   Pulmonary/Chest: Effort normal and breath sounds normal. No respiratory distress. She has no wheezes. She  has no rales.  Abdominal: Soft. Bowel sounds are normal. She exhibits no distension. There is no tenderness.  Musculoskeletal: Normal range of motion.  Neurological: She is alert and oriented to person, place, and time.  Skin: She is not diaphoretic.  Psychiatric: She has a normal mood and affect. Her behavior is normal.          Assessment & Plan:  Please see problem based assessment and plan.

## 2013-06-10 NOTE — Assessment & Plan Note (Signed)
She says she had only 1 drink at Christmas and has otherwise been sober.  However, her EtOH level was elevated and she still has electrolyte abnormalities concerning for continued alcohol use.  She is in [place] and [facility] for her alcohol use.   She should continue these programs and work on staying sober.

## 2013-06-11 ENCOUNTER — Telehealth: Payer: Self-pay | Admitting: *Deleted

## 2013-06-11 LAB — VITAMIN D 25 HYDROXY (VIT D DEFICIENCY, FRACTURES): Vit D, 25-Hydroxy: <10 ng/mL — ABNORMAL LOW (ref 30–89)

## 2013-06-11 NOTE — Telephone Encounter (Signed)
Pt called and would like you to call her today with the results of her lab work, done on 1/6 Pt # [phone]

## 2013-06-12 DIAGNOSIS — E8729 Other acidosis: Secondary | ICD-10-CM | POA: Insufficient documentation

## 2013-06-12 DIAGNOSIS — E872 Acidosis: Secondary | ICD-10-CM | POA: Insufficient documentation

## 2013-06-12 DIAGNOSIS — R748 Abnormal levels of other serum enzymes: Secondary | ICD-10-CM | POA: Insufficient documentation

## 2013-06-12 NOTE — Assessment & Plan Note (Addendum)
Assessment:  AST, Alk phos and GGT elevated in woman with history of alcohol abuse.  Her hepatitis panel is negative.  AST roughly 3x upper limit of normal.  Normal ALT with AST:ALT of > 2:1 consistent with alcoholic liver disease.   Plan:   1) abdominal US to evaluate liver  2) Patient advised to maintain sobriety and stop taking Tylenol

## 2013-06-12 NOTE — Assessment & Plan Note (Signed)
Her creatinine is improving from above 2.0 in June 2014 to 1.32 now.

## 2013-06-12 NOTE — Assessment & Plan Note (Addendum)
She has had normal gap metabolic acidosis in the past.  Now with AG of 19 with normal Na and K, HCO3 15.  Possibly due to CKD vs. Lactic acidosis 2/2 to EtOH or some combination.  She is advised to stop Tylenol use and maintain sobriety.  She was in a hurry today and left before review of today's labs.  They are improved from 2 weeks ago but still abnormal.  She says she will be serving a 1 week jail sentence starting on 06/11/13.  She will need close follow-up and monitoring.    Plan: 1) Will call patient to review labs 2) Return for BMP in 2 weeks

## 2013-06-12 NOTE — Assessment & Plan Note (Addendum)
Her Mg continues to be low.  Advised to continue Mg supplementation.

## 2013-06-13 ENCOUNTER — Telehealth: Payer: Self-pay | Admitting: Internal Medicine

## 2013-06-13 NOTE — Telephone Encounter (Signed)
I called to review [name] [name] labs with her.  The person who answered the phone informed me that she was not available and is currently service 1 week jail sentence.  I said I would call back to speak with her.

## 2013-06-13 NOTE — Progress Notes (Signed)
I saw and evaluated the patient.  I personally confirmed the key portions of the history and exam documented by Dr. Wilson and I reviewed pertinent patient test results.  The assessment, diagnosis, and plan were formulated together and I agree with the documentation in the resident's note. 

## 2013-06-17 NOTE — Telephone Encounter (Signed)
Done

## 2013-06-19 ENCOUNTER — Other Ambulatory Visit: Payer: Self-pay | Admitting: Internal Medicine

## 2013-06-19 ENCOUNTER — Encounter: Payer: Self-pay | Admitting: Internal Medicine

## 2013-06-19 DIAGNOSIS — E872 Acidosis, unspecified: Secondary | ICD-10-CM

## 2013-06-19 NOTE — Progress Notes (Signed)
[name], female   DOB: 27-May-1956, 58 y.o.   MRN: [id number]  [name] returned to clinic today and wanted to know the results of her labs.  I informed her that she still had several lab abnormalities.  I advised that she continue her Mg, Ca and vitamin D supplementation and she agrees.  I advised her to continue EtOH cessation and to stop Tylenol and she agrees.  GGT is elevated, alk phos continues to increase and she is agreeable to getting the abdominal US that was ordered.  Will also refer to nephrology for her continued low bicarb.  She will come for repeat labs next week and for follow-up with PCP in 1 month.

## 2013-06-24 ENCOUNTER — Other Ambulatory Visit (INDEPENDENT_AMBULATORY_CARE_PROVIDER_SITE_OTHER): Payer: PRIVATE HEALTH INSURANCE

## 2013-06-24 DIAGNOSIS — E872 Acidosis, unspecified: Secondary | ICD-10-CM

## 2013-06-24 LAB — COMPLETE METABOLIC PANEL WITH GFR
ALBUMIN: 2.4 g/dL — AB (ref 3.5–5.2)
ALT: 96 U/L — ABNORMAL HIGH (ref 0–35)
AST: 530 U/L — AB (ref 0–37)
Alkaline Phosphatase: 469 U/L — ABNORMAL HIGH (ref 39–117)
BUN: 22 mg/dL (ref 6–23)
CALCIUM: 7.5 mg/dL — AB (ref 8.4–10.5)
CHLORIDE: 108 meq/L (ref 96–112)
CO2: 15 meq/L — AB (ref 19–32)
CREATININE: 1.36 mg/dL — AB (ref 0.50–1.10)
GFR, EST AFRICAN AMERICAN: 50 mL/min — AB
GFR, Est Non African American: 44 mL/min — ABNORMAL LOW
Glucose, Bld: 128 mg/dL — ABNORMAL HIGH (ref 70–99)
POTASSIUM: 4.8 meq/L (ref 3.5–5.3)
Sodium: 142 mEq/L (ref 135–145)
Total Bilirubin: 0.3 mg/dL (ref 0.3–1.2)
Total Protein: 8.7 g/dL — ABNORMAL HIGH (ref 6.0–8.3)

## 2013-06-24 LAB — MAGNESIUM: Magnesium: 1.1 mg/dL — ABNORMAL LOW (ref 1.5–2.5)

## 2013-06-30 ENCOUNTER — Ambulatory Visit (HOSPITAL_COMMUNITY)
Admission: RE | Admit: 2013-06-30 | Discharge: 2013-06-30 | Disposition: A | Discharge status: Home / Self Care | Payer: PRIVATE HEALTH INSURANCE | Source: Ambulatory Visit | Attending: Internal Medicine | Admitting: Internal Medicine

## 2013-06-30 DIAGNOSIS — R7989 Other specified abnormal findings of blood chemistry: Secondary | ICD-10-CM | POA: Insufficient documentation

## 2013-06-30 DIAGNOSIS — K838 Other specified diseases of biliary tract: Secondary | ICD-10-CM | POA: Insufficient documentation

## 2013-06-30 DIAGNOSIS — F1010 Alcohol abuse, uncomplicated: Secondary | ICD-10-CM | POA: Insufficient documentation

## 2013-06-30 DIAGNOSIS — K8689 Other specified diseases of pancreas: Secondary | ICD-10-CM | POA: Insufficient documentation

## 2013-06-30 DIAGNOSIS — R748 Abnormal levels of other serum enzymes: Secondary | ICD-10-CM

## 2013-07-02 ENCOUNTER — Other Ambulatory Visit: Payer: Self-pay | Admitting: *Deleted

## 2013-07-03 MED ORDER — GABAPENTIN 300 MG PO CAPS: 600.0000 mg | ORAL_CAPSULE | Freq: Three times a day (TID) | ORAL | Status: DC

## 2013-07-08 ENCOUNTER — Other Ambulatory Visit: Payer: Self-pay | Admitting: *Deleted

## 2013-07-08 MED ORDER — FOLIC ACID 1 MG PO TABS: 1.0000 mg | ORAL_TABLET | Freq: Every day | ORAL | Status: DC

## 2013-07-10 ENCOUNTER — Encounter: Payer: Self-pay | Admitting: Gastroenterology

## 2013-07-11 ENCOUNTER — Other Ambulatory Visit: Payer: Self-pay | Admitting: Internal Medicine

## 2013-07-24 ENCOUNTER — Encounter: Payer: Self-pay | Admitting: Internal Medicine

## 2013-07-31 ENCOUNTER — Encounter: Payer: Self-pay | Admitting: Internal Medicine

## 2013-08-01 ENCOUNTER — Telehealth: Payer: Self-pay | Admitting: *Deleted

## 2013-08-01 NOTE — Telephone Encounter (Signed)
Pharmacy states they did not get the script done in dec 2014 for 400mg  tabs take 4 daily, they state pt has been taking 3 daily from script done in oct 2014, today i gave them verbally the script from dec 2014

## 2013-08-07 ENCOUNTER — Ambulatory Visit (INDEPENDENT_AMBULATORY_CARE_PROVIDER_SITE_OTHER): Payer: PRIVATE HEALTH INSURANCE | Admitting: Internal Medicine

## 2013-08-07 ENCOUNTER — Encounter: Payer: Self-pay | Admitting: Internal Medicine

## 2013-08-07 DIAGNOSIS — N183 Chronic kidney disease, stage 3 unspecified: Secondary | ICD-10-CM

## 2013-08-07 DIAGNOSIS — E872 Acidosis, unspecified: Secondary | ICD-10-CM

## 2013-08-07 DIAGNOSIS — E8729 Other acidosis: Secondary | ICD-10-CM

## 2013-08-07 DIAGNOSIS — R748 Abnormal levels of other serum enzymes: Secondary | ICD-10-CM

## 2013-08-07 NOTE — Assessment & Plan Note (Signed)
Her Magox was increased to QID in December, but per Pharmacy this order was not started until 2/27. She has been taking the magnesium replacement TID. She was asked to begin QID. Checking Mg level.

## 2013-08-07 NOTE — Assessment & Plan Note (Signed)
Checking CMP to assess renal function. Pt with stable renal function and anion gap in January on repeat labs. She states that she has an appt with Nephrology on the 9th of this month. Will recheck CMP and obtain Nephrology records. Pt is to f/u in 1 week.

## 2013-08-07 NOTE — Assessment & Plan Note (Signed)
AG 19 with bicarb of 15 in January. Likely related to renal function vs EtOH use, although she denies any recent use of EtOH. Checking CMP to assess for worsening acidosis. Nephrology appt on Monday.

## 2013-08-07 NOTE — Progress Notes (Addendum)
[name], female   DOB: 1955/11/07, 58 y.o.   MRN: [id number]  Subjective:   [name] female   DOB: January 06, 1956 58 y.o.   MRN: [id number]  HPI: [name] [name] is a 58 y.o.   Her abdominal u/s showed signs of chronic pancreatitis, no liver findings except some gallbladder sludge, and echogenic renal parenchyma, suggesting medical renal disease. Pt has an appt with Nephrology on March 9th.   She continues to abstain from EtOH since Christmas when she states that she had a drink. She tries to go to her AA classes but has had trouble making them with the weather recently.  She states that since quitting EtOH she has had trouble with sleep and is requesting a a sleep aid.   She endorses compliance with her meds, but states that she is only taking the Magnesium oxide 3x day instead of 4x day. Her pharmacy called on 2/27 and did not increase her dose of Mag to QID as prescribed in Dec until 2/27.  She denies fevers, headaches, vision changes, dizziness, abdominal pain, N/V, or peripheral edema.   Past Medical History  Diagnosis Date  . Anemia, B12 deficiency   . History of acute pancreatitis   . Right knee pain     No recent imaging on chart  . 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 Dr. Mancel Bale, [facility]  . Hypertriglyceridemia   . GERD (gastroesophageal reflux disease)   . Vitamin D deficiency   . Subdural hematoma 02/2008    Likely 2/2 trauma from seizure from EtOH withdrawal, chronic in nature, sees Dr. Jerene Bears. Most recent CT head 10/2009 showing stable but persistent hematoma without mass effect.  . History of seizure disorder     Likely 2/2 alcohol abuse  . Hypocalcemia   . Hypomagnesemia   . Failure to thrive in childhood     Unclear etiology  . HTN (hypertension)   . Thrombocytopenia   . Anemia, macrocytic   . Hepatomegaly     On exam  . Alcohol abuse   . Joint  pain   . Alcohol abuse   . Arthritis   . Vitamin D deficiency   . Menopause   . Pancreatitis   . Insomnia   . Hyperlipidemia   . Sinusitis   . Pernicious anemia   . Subdural hematoma   . Macrocytic anemia   . Hepatomegaly   . 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 Seizures     1.5 YRS  LAST ONE  . Depression   . Fx humeral neck 04/17/2011    Transverse fracture- minimally displaced- managed as outpatient   . ABNORMAL PAP SMEAR, LGSIL 07/23/2008    Annotation: HPV positive CIN I Dr. Mancel Bale, [facility] Qualifier: Diagnosis of  By: Oretha Ellis    . Pneumonia 05/20/2012  . Hepatitis     HEPATOMEGALY    Current Outpatient Prescriptions  Medication Sig Dispense Refill  . amLODipine (NORVASC) 10 MG tablet Take 0.5 tablets (5 mg total) by mouth daily. For hypertension  90 tablet  4  . aspirin EC 81 MG tablet Take 1 tablet (81 mg total) by mouth every evening.  90 tablet  3  . calcium carbonate (TUMS) 500 MG chewable tablet Chew 6 tablets (1,200 mg of elemental calcium total) by mouth 3 (three) times daily. For bone health  90 tablet  3  . capsaicin (ZOSTRIX) 0.025 % cream Apply topically 2 (two) times daily.  45 g  0  . cetirizine (ZYRTEC) 10 MG tablet Take 1 tablet (10 mg total) by mouth daily.  90 tablet  3  . diclofenac sodium (VOLTAREN) 1 % GEL Apply 1 application topically 3 (three) times daily as needed (pain). Apply 2 grams to affected area 4 times daily as needed  100 g  0  . Disposable Gloves (BD VINYL GLOVES LARGE/X-LARGE) MISC 1 Container by Does not apply route once.  50 each  0  . ferrous sulfate 325 (65 FE) MG tablet Take 1 tablet (325 mg total) by mouth 2 (two) times daily with a meal.  180 tablet  3  . FLUoxetine (PROZAC) 10 MG capsule Take 1 capsule (10 mg total) by mouth daily. For depression  90 capsule  4  . folic acid (FOLVITE) 1 MG tablet Take 1 tablet (1 mg total) by mouth daily. For folic acid replacement  30 tablet  3  . furosemide  (LASIX) 20 MG tablet Take 1 tablet (20 mg total) by mouth daily.  30 tablet  1  . gabapentin (NEURONTIN) 300 MG capsule Take 2 capsules (600 mg total) by mouth 3 (three) times daily. For anxiety/pain control  540 capsule  1  . lipase/protease/amylase (CREON-12/PANCREASE) 12000 UNITS CPEP capsule Take 1 capsule by mouth 3 (three) times daily before meals.  270 capsule  3  . magnesium oxide (MAG-OX) 400 MG tablet Take 1 tablet (400 mg total) by mouth 4 (four) times daily.  120 tablet  3  . meclizine (ANTIVERT) 50 MG tablet Take 1 tablet (50 mg total) by mouth 3 (three) times daily as needed for dizziness.  30 tablet  0  . Multiple Vitamin (MULTIVITAMIN WITH MINERALS) TABS tablet Take 1 tablet by mouth daily. For vitamin replacement  90 tablet  3  . Nutritional Supplements (ENSURE HIGH PROTEIN PO) Take 1 Can by mouth 3 (three) times daily. chocolate      . omeprazole (PRILOSEC) 40 MG capsule Take 1 capsule (40 mg total) by mouth daily.  90 capsule  4  . potassium chloride SA (KLOR-CON M20) 20 MEQ tablet Take 1 tablet (20 mEq total) by mouth daily. For low potassium  90 tablet  0  . thiamine (VITAMIN B-1) 100 MG tablet Take 1 tablet (100 mg total) by mouth daily. For low thiamine  90 tablet  0  . vitamin B-12 (CYANOCOBALAMIN) 250 MCG tablet Take 1 tablet (250 mcg total) by mouth every evening.  90 tablet  3  . Vitamin D, Ergocalciferol, (DRISDOL) 50000 UNITS CAPS Take 1 capsule (50,000 Units total) by mouth every 7 (seven) days. Thursdays: For bone health  30 capsule     No current facility-administered medications for this visit.   Family History  Problem Relation Age of Onset  . Cancer Mother     Died from stomach cancer and "flesh eating rash  . Heart failure Father     Died in 20s from an MI  . Alcohol abuse Sister     Twin sister drinks a lot, as did both her parents and brothers  . Stroke Brother     Has 7 brothers, 1 with CVA   History   Social History  . Marital Status: Divorced     Spouse Name: N/A    Number of Children: N/A  . Years of Education: N/A   Social History Main Topics  . Smoking status: Former  Smoker    Types: Cigarettes    Quit date: 09/20/2010  . Smokeless tobacco: Never Used  . Alcohol Use: 6.0 oz/week    10 Glasses of wine per week     Comment: No alocohol x 3 weeks  . Drug Use: No     Comment: HX  USE   . Sexual Activity: Not Currently   Other Topics Concern  . None   Social History Narrative   Lives with her significant other and 2 grandchildren. 1 child   Has 7 brothers and 4 sisters, 1 twin sister.   Unemployed, worked in [employer].    Abuses alcohol-drinks 1 glass of wine daily    No drug use. Former cigarette use quit 1.5 years ago.     11 th grade education            Review of Systems: A 12 point ROS was performed; pertinent positives and negatives were noted in the HPI  Objective:  Physical Exam: Filed Vitals:   08/07/13 1529  BP: 107/79  Pulse: 110  Temp: 98.7 F (37.1 C)  TempSrc: Oral  Height: 5\' 1"  (1.549 m)  Weight: 82 lb 11.2 oz (37.512 kg)  SpO2: 97%   Constitutional: Vital signs reviewed.  Patient is a cachectic female in no acute distress and cooperative with exam.   Head: Normocephalic and atraumatic Eyes: PERRL, EOMI, conjunctivae normal.  Cardiovascular: RRR, no MRG, pulses symmetric and intact bilaterally Pulmonary/Chest: Normal respiratory effort, CTAB, no wheezes, rales, or rhonchi Abdominal: Soft. Non-tender, non-distended Musculoskeletal: Moves all 4 extremities w/o difficulty Neurological: A&O x3, nonfocal. No tremor or tetany. Skin: Warm, dry and intact.  Psychiatric: Normal mood and affect- appears to be in good spirits. Speech and behavior is normal.   Assessment & Plan:   **[name] [name] had to leave the clinic early today to pick up her grandson from school before our clinic visit was complete. She was asked to stop by the lab before she left the clinic for a CMP and Mg to assess her renal  fx, Ca and Mg levels, and she was to return to the clinic in 1 week to discuss her lab results and to discuss her Nephrology appointment for her CKD. However, she did not stop by the lab on her way out, stating that she will return tomorrow. If she does not return for labs tomorrow, we will call her to set up an appt to be seen and for labs.  **Will reassess difficulty falling asleep at her next clinic visit  Please refer to Problem List based Assessment and Plan  Addendum: 08/08/13 Sodium, bicarb, Ca and Mg are all lower on CMP today compared to 06/24/13. Her Magnesium replacement was increased to QID this visit. She will need to return to the clinic next week for repeat labs and to be seen for possible changes to her supplement regimen. I am concerned that she may be drinking again, although she denies any use. Her serum EtOH level was elevated in December despite reported abstinence.

## 2013-08-08 ENCOUNTER — Other Ambulatory Visit (INDEPENDENT_AMBULATORY_CARE_PROVIDER_SITE_OTHER): Payer: PRIVATE HEALTH INSURANCE

## 2013-08-08 ENCOUNTER — Other Ambulatory Visit: Payer: Self-pay | Admitting: *Deleted

## 2013-08-08 ENCOUNTER — Telehealth: Payer: Self-pay | Admitting: *Deleted

## 2013-08-08 DIAGNOSIS — E8729 Other acidosis: Secondary | ICD-10-CM

## 2013-08-08 DIAGNOSIS — E872 Acidosis: Secondary | ICD-10-CM

## 2013-08-08 DIAGNOSIS — R748 Abnormal levels of other serum enzymes: Secondary | ICD-10-CM

## 2013-08-08 DIAGNOSIS — N183 Chronic kidney disease, stage 3 unspecified: Secondary | ICD-10-CM

## 2013-08-08 DIAGNOSIS — E8809 Other disorders of plasma-protein metabolism, not elsewhere classified: Secondary | ICD-10-CM

## 2013-08-08 DIAGNOSIS — F1010 Alcohol abuse, uncomplicated: Secondary | ICD-10-CM

## 2013-08-08 LAB — COMPLETE METABOLIC PANEL WITH GFR
ALT: 28 U/L (ref 0–35)
AST: 124 U/L — ABNORMAL HIGH (ref 0–37)
Albumin: 2.1 g/dL — ABNORMAL LOW (ref 3.5–5.2)
Alkaline Phosphatase: 507 U/L — ABNORMAL HIGH (ref 39–117)
BILIRUBIN TOTAL: 0.4 mg/dL (ref 0.3–1.2)
BUN: 17 mg/dL (ref 6–23)
CO2: 14 mEq/L — ABNORMAL LOW (ref 19–32)
Calcium: 5.9 mg/dL — CL (ref 8.4–10.5)
Chloride: 101 mEq/L (ref 96–112)
Creat: 1.28 mg/dL — ABNORMAL HIGH (ref 0.50–1.10)
GFR, EST AFRICAN AMERICAN: 54 mL/min — AB
GFR, Est Non African American: 47 mL/min — ABNORMAL LOW
GLUCOSE: 116 mg/dL — AB (ref 70–99)
Potassium: 4 mEq/L (ref 3.5–5.3)
SODIUM: 132 meq/L — AB (ref 135–145)
TOTAL PROTEIN: 8.2 g/dL (ref 6.0–8.3)

## 2013-08-08 LAB — MAGNESIUM: Magnesium: 0.7 mg/dL — CL (ref 1.5–2.5)

## 2013-08-08 MED ORDER — DICLOFENAC SODIUM 1 % TD GEL: 2.0000 g | Freq: Two times a day (BID) | TRANSDERMAL | Status: DC

## 2013-08-08 MED ORDER — VITAMIN D (ERGOCALCIFEROL) 1.25 MG (50000 UNIT) PO CAPS: 50000.0000 [IU] | ORAL_CAPSULE | ORAL | Status: DC

## 2013-08-08 NOTE — Assessment & Plan Note (Addendum)
Abdominal u/s w/ gallbladder sludge but no evidence of cholelithiasis. Evidence of chronic pancreatitis and could not r/o stone in pancreatic duct. AST/ALT improved on labs from 08/08/13. AP increased to 507; it was as high as 554 on 05/21/13. Hepatitis panel was negative. EtOH was positive at that time, and GGT was elevated as well to 510.  Her elevated LFTs are likely related to her history of EtOH abuse.   A referral to GI was placed at her last visit on 06/24/13; her appt was scheduled for 3/9 but has been cancelled by the provider per [facility]. If the appointment is not rescheduled next week, will have to call [facility] to set up the appointment, as she will likely need an MRCP/ERCP and possibly a liver biopsy. - Check AMA and serum EtOH level at next appt

## 2013-08-08 NOTE — Progress Notes (Signed)
Case discussed with Dr. Eulas Post soon after the resident saw the patient.  We reviewed the resident's history and exam and pertinent patient test results.  I agree with the assessment, diagnosis and plan of care documented in the resident's note.

## 2013-08-08 NOTE — Telephone Encounter (Signed)
Labs given to dr Software engineer from lab to triage, dr Software engineer ask that dr Eulas Post be called and made aware of results, called dr Eulas Post she has reviewed labs and wants pt back in clinic next week, triage will call her and schedule for this repeat of labs and appt  08/12/2013 finally reached pt, she will come back in on Thursday 3/12 for repeat labs, please put in order for labs. Her appt was cancelled by [place] to see gi doc, in the system it states she cancelled but i called  and christy states the office cancelled for now due to pt seeing dr ha in past, they have ask for his records then they will be reviewed by md at [place] and then they will decide if they will see her and at that time schedule an appt for her

## 2013-08-08 NOTE — Addendum Note (Signed)
Addended by: Truddie Crumble on: 08/08/2013 11:02 AM   Modules accepted: Orders

## 2013-08-11 ENCOUNTER — Ambulatory Visit: Payer: Self-pay | Admitting: Gastroenterology

## 2013-08-13 NOTE — Telephone Encounter (Signed)
Lab orders have been placed. Thanks.

## 2013-08-14 ENCOUNTER — Other Ambulatory Visit (INDEPENDENT_AMBULATORY_CARE_PROVIDER_SITE_OTHER): Payer: PRIVATE HEALTH INSURANCE

## 2013-08-14 ENCOUNTER — Telehealth: Payer: Self-pay | Admitting: Internal Medicine

## 2013-08-14 DIAGNOSIS — E872 Acidosis, unspecified: Secondary | ICD-10-CM

## 2013-08-14 DIAGNOSIS — E8729 Other acidosis: Secondary | ICD-10-CM

## 2013-08-14 DIAGNOSIS — N183 Chronic kidney disease, stage 3 unspecified: Secondary | ICD-10-CM

## 2013-08-14 DIAGNOSIS — R748 Abnormal levels of other serum enzymes: Secondary | ICD-10-CM

## 2013-08-14 DIAGNOSIS — E8809 Other disorders of plasma-protein metabolism, not elsewhere classified: Secondary | ICD-10-CM

## 2013-08-14 DIAGNOSIS — F1010 Alcohol abuse, uncomplicated: Secondary | ICD-10-CM

## 2013-08-14 LAB — MAGNESIUM: MAGNESIUM: 0.9 mg/dL — AB (ref 1.5–2.5)

## 2013-08-14 LAB — COMPLETE METABOLIC PANEL WITH GFR
ALT: 25 U/L (ref 0–35)
AST: 133 U/L — AB (ref 0–37)
Albumin: 1.9 g/dL — ABNORMAL LOW (ref 3.5–5.2)
Alkaline Phosphatase: 471 U/L — ABNORMAL HIGH (ref 39–117)
BILIRUBIN TOTAL: 0.3 mg/dL (ref 0.3–1.2)
BUN: 20 mg/dL (ref 6–23)
CALCIUM: 6.2 mg/dL — AB (ref 8.4–10.5)
CO2: 14 mEq/L — ABNORMAL LOW (ref 19–32)
CREATININE: 1.21 mg/dL — AB (ref 0.50–1.10)
Chloride: 103 mEq/L (ref 96–112)
GFR, Est African American: 58 mL/min — ABNORMAL LOW
GFR, Est Non African American: 50 mL/min — ABNORMAL LOW
Glucose, Bld: 148 mg/dL — ABNORMAL HIGH (ref 70–99)
Potassium: 3.9 mEq/L (ref 3.5–5.3)
Sodium: 135 mEq/L (ref 135–145)
Total Protein: 7.8 g/dL (ref 6.0–8.3)

## 2013-08-14 LAB — ETHANOL: Alcohol, Ethyl (B): 244 mg/dL — ABNORMAL HIGH (ref 0–10)

## 2013-08-14 NOTE — Telephone Encounter (Addendum)
I called [name] [name] with her blood work results from today. She has a magnesium level of 0.9. She talked to me over the phone. She denied having any chest pain, palpitations, dizziness, shortness of breath. She said, "I am feeling just fine." I discussed the implications of having low magnesium and that she should come to the ER immediately to get IV magnesium. She said, she has no time and she would probably think about coming tomorrow. I discussed that she could have heart rhythm irregularities, which could be fatal, and [name] [name] responded that she knows, she understands that, and she would try to come if she has time. I urged her to take the Magnesium supplement which she has twice a day and come to the ER asap.

## 2013-08-15 ENCOUNTER — Other Ambulatory Visit: Payer: Self-pay

## 2013-08-15 ENCOUNTER — Observation Stay (HOSPITAL_COMMUNITY): Payer: PRIVATE HEALTH INSURANCE

## 2013-08-15 ENCOUNTER — Inpatient Hospital Stay (HOSPITAL_COMMUNITY)
Admission: EM | Admit: 2013-08-15 | Discharge: 2013-08-22 | DRG: 871 | Disposition: A | Discharge status: Home Health | Payer: PRIVATE HEALTH INSURANCE | Attending: Internal Medicine | Admitting: Internal Medicine

## 2013-08-15 ENCOUNTER — Encounter (HOSPITAL_COMMUNITY): Payer: Self-pay | Admitting: Emergency Medicine

## 2013-08-15 DIAGNOSIS — I129 Hypertensive chronic kidney disease with stage 1 through stage 4 chronic kidney disease, or unspecified chronic kidney disease: Secondary | ICD-10-CM | POA: Diagnosis present

## 2013-08-15 DIAGNOSIS — F1010 Alcohol abuse, uncomplicated: Secondary | ICD-10-CM | POA: Diagnosis present

## 2013-08-15 DIAGNOSIS — R000 Tachycardia, unspecified: Secondary | ICD-10-CM | POA: Diagnosis present

## 2013-08-15 DIAGNOSIS — J9610 Chronic respiratory failure, unspecified whether with hypoxia or hypercapnia: Secondary | ICD-10-CM | POA: Diagnosis present

## 2013-08-15 DIAGNOSIS — H539 Unspecified visual disturbance: Secondary | ICD-10-CM | POA: Diagnosis not present

## 2013-08-15 DIAGNOSIS — K922 Gastrointestinal hemorrhage, unspecified: Secondary | ICD-10-CM

## 2013-08-15 DIAGNOSIS — N183 Chronic kidney disease, stage 3 unspecified: Secondary | ICD-10-CM | POA: Diagnosis present

## 2013-08-15 DIAGNOSIS — E872 Acidosis, unspecified: Secondary | ICD-10-CM | POA: Diagnosis present

## 2013-08-15 DIAGNOSIS — Z87891 Personal history of nicotine dependence: Secondary | ICD-10-CM

## 2013-08-15 DIAGNOSIS — E43 Unspecified severe protein-calorie malnutrition: Secondary | ICD-10-CM | POA: Diagnosis present

## 2013-08-15 DIAGNOSIS — F411 Generalized anxiety disorder: Secondary | ICD-10-CM | POA: Diagnosis present

## 2013-08-15 DIAGNOSIS — F3289 Other specified depressive episodes: Secondary | ICD-10-CM | POA: Diagnosis present

## 2013-08-15 DIAGNOSIS — K219 Gastro-esophageal reflux disease without esophagitis: Secondary | ICD-10-CM | POA: Diagnosis present

## 2013-08-15 DIAGNOSIS — D510 Vitamin B12 deficiency anemia due to intrinsic factor deficiency: Secondary | ICD-10-CM | POA: Diagnosis present

## 2013-08-15 DIAGNOSIS — J690 Pneumonitis due to inhalation of food and vomit: Secondary | ICD-10-CM | POA: Diagnosis present

## 2013-08-15 DIAGNOSIS — R64 Cachexia: Secondary | ICD-10-CM | POA: Diagnosis present

## 2013-08-15 DIAGNOSIS — D509 Iron deficiency anemia, unspecified: Secondary | ICD-10-CM | POA: Diagnosis present

## 2013-08-15 DIAGNOSIS — Z7982 Long term (current) use of aspirin: Secondary | ICD-10-CM

## 2013-08-15 DIAGNOSIS — R509 Fever, unspecified: Secondary | ICD-10-CM

## 2013-08-15 DIAGNOSIS — D529 Folate deficiency anemia, unspecified: Secondary | ICD-10-CM | POA: Diagnosis present

## 2013-08-15 DIAGNOSIS — I10 Essential (primary) hypertension: Secondary | ICD-10-CM

## 2013-08-15 DIAGNOSIS — Z79899 Other long term (current) drug therapy: Secondary | ICD-10-CM

## 2013-08-15 DIAGNOSIS — F10231 Alcohol dependence with withdrawal delirium: Secondary | ICD-10-CM | POA: Diagnosis present

## 2013-08-15 DIAGNOSIS — F10931 Alcohol use, unspecified with withdrawal delirium: Secondary | ICD-10-CM | POA: Diagnosis present

## 2013-08-15 DIAGNOSIS — R6520 Severe sepsis without septic shock: Secondary | ICD-10-CM

## 2013-08-15 DIAGNOSIS — F1020 Alcohol dependence, uncomplicated: Secondary | ICD-10-CM | POA: Diagnosis present

## 2013-08-15 DIAGNOSIS — G589 Mononeuropathy, unspecified: Secondary | ICD-10-CM | POA: Diagnosis present

## 2013-08-15 DIAGNOSIS — I498 Other specified cardiac arrhythmias: Secondary | ICD-10-CM | POA: Diagnosis present

## 2013-08-15 DIAGNOSIS — Z8701 Personal history of pneumonia (recurrent): Secondary | ICD-10-CM

## 2013-08-15 DIAGNOSIS — N179 Acute kidney failure, unspecified: Secondary | ICD-10-CM

## 2013-08-15 DIAGNOSIS — R627 Adult failure to thrive: Secondary | ICD-10-CM | POA: Diagnosis present

## 2013-08-15 DIAGNOSIS — E8809 Other disorders of plasma-protein metabolism, not elsewhere classified: Secondary | ICD-10-CM | POA: Diagnosis present

## 2013-08-15 DIAGNOSIS — R634 Abnormal weight loss: Secondary | ICD-10-CM

## 2013-08-15 DIAGNOSIS — Z91199 Patient's noncompliance with other medical treatment and regimen due to unspecified reason: Secondary | ICD-10-CM

## 2013-08-15 DIAGNOSIS — D649 Anemia, unspecified: Secondary | ICD-10-CM | POA: Diagnosis present

## 2013-08-15 DIAGNOSIS — Z681 Body mass index (BMI) 19 or less, adult: Secondary | ICD-10-CM

## 2013-08-15 DIAGNOSIS — R443 Hallucinations, unspecified: Secondary | ICD-10-CM | POA: Diagnosis present

## 2013-08-15 DIAGNOSIS — Z9119 Patient's noncompliance with other medical treatment and regimen: Secondary | ICD-10-CM

## 2013-08-15 DIAGNOSIS — A419 Sepsis, unspecified organism: Principal | ICD-10-CM | POA: Diagnosis present

## 2013-08-15 DIAGNOSIS — J189 Pneumonia, unspecified organism: Secondary | ICD-10-CM | POA: Diagnosis present

## 2013-08-15 DIAGNOSIS — K31819 Angiodysplasia of stomach and duodenum without bleeding: Secondary | ICD-10-CM | POA: Diagnosis present

## 2013-08-15 DIAGNOSIS — N390 Urinary tract infection, site not specified: Secondary | ICD-10-CM | POA: Diagnosis present

## 2013-08-15 DIAGNOSIS — K861 Other chronic pancreatitis: Secondary | ICD-10-CM | POA: Diagnosis present

## 2013-08-15 DIAGNOSIS — E538 Deficiency of other specified B group vitamins: Secondary | ICD-10-CM | POA: Diagnosis present

## 2013-08-15 DIAGNOSIS — E785 Hyperlipidemia, unspecified: Secondary | ICD-10-CM | POA: Diagnosis present

## 2013-08-15 DIAGNOSIS — D890 Polyclonal hypergammaglobulinemia: Secondary | ICD-10-CM | POA: Diagnosis present

## 2013-08-15 DIAGNOSIS — F329 Major depressive disorder, single episode, unspecified: Secondary | ICD-10-CM | POA: Diagnosis present

## 2013-08-15 LAB — BASIC METABOLIC PANEL
BUN: 17 mg/dL (ref 6–23)
CO2: 13 meq/L — AB (ref 19–32)
Calcium: 6.4 mg/dL — CL (ref 8.4–10.5)
Chloride: 103 mEq/L (ref 96–112)
Creatinine, Ser: 1.05 mg/dL (ref 0.50–1.10)
GFR calc Af Amer: 67 mL/min — ABNORMAL LOW (ref >90)
GFR calc non Af Amer: 58 mL/min — ABNORMAL LOW (ref >90)
GLUCOSE: 115 mg/dL — AB (ref 70–99)
POTASSIUM: 4.2 meq/L (ref 3.7–5.3)
SODIUM: 136 meq/L — AB (ref 137–147)

## 2013-08-15 LAB — COMPREHENSIVE METABOLIC PANEL
ALT: 27 U/L (ref 0–35)
AST: 157 U/L — ABNORMAL HIGH (ref 0–37)
Albumin: 1.8 g/dL — ABNORMAL LOW (ref 3.5–5.2)
Alkaline Phosphatase: 475 U/L — ABNORMAL HIGH (ref 39–117)
BILIRUBIN TOTAL: 0.4 mg/dL (ref 0.3–1.2)
BUN: 20 mg/dL (ref 6–23)
CO2: 14 meq/L — AB (ref 19–32)
Calcium: 6.2 mg/dL — CL (ref 8.4–10.5)
Chloride: 104 mEq/L (ref 96–112)
Creatinine, Ser: 1.08 mg/dL (ref 0.50–1.10)
GFR calc Af Amer: 65 mL/min — ABNORMAL LOW (ref >90)
GFR calc non Af Amer: 56 mL/min — ABNORMAL LOW (ref >90)
GLUCOSE: 110 mg/dL — AB (ref 70–99)
Potassium: 4.2 mEq/L (ref 3.7–5.3)
SODIUM: 136 meq/L — AB (ref 137–147)
Total Protein: 7.2 g/dL (ref 6.0–8.3)

## 2013-08-15 LAB — CBC WITH DIFFERENTIAL/PLATELET
BASOS ABS: 0 10*3/uL (ref 0.0–0.1)
Basophils Relative: 1 % (ref 0–1)
Eosinophils Absolute: 0.1 10*3/uL (ref 0.0–0.7)
Eosinophils Relative: 1 % (ref 0–5)
HCT: 23.3 % — ABNORMAL LOW (ref 36.0–46.0)
Hemoglobin: 8 g/dL — ABNORMAL LOW (ref 12.0–15.0)
Lymphocytes Relative: 28 % (ref 12–46)
Lymphs Abs: 1.8 10*3/uL (ref 0.7–4.0)
MCH: 33.9 pg (ref 26.0–34.0)
MCHC: 34.3 g/dL (ref 30.0–36.0)
MCV: 98.7 fL (ref 78.0–100.0)
MONO ABS: 0.7 10*3/uL (ref 0.1–1.0)
Monocytes Relative: 11 % (ref 3–12)
NEUTROS ABS: 3.7 10*3/uL (ref 1.7–7.7)
Neutrophils Relative %: 59 % (ref 43–77)
PLATELETS: 148 10*3/uL — AB (ref 150–400)
RBC: 2.36 MIL/uL — ABNORMAL LOW (ref 3.87–5.11)
RDW: 13.4 % (ref 11.5–15.5)
WBC: 6.3 10*3/uL (ref 4.0–10.5)

## 2013-08-15 LAB — ETHANOL: Alcohol, Ethyl (B): 169 mg/dL — ABNORMAL HIGH (ref 0–11)

## 2013-08-15 LAB — LIPASE, BLOOD: Lipase: 8 U/L — ABNORMAL LOW (ref 11–59)

## 2013-08-15 LAB — MAGNESIUM
Magnesium: 0.8 mg/dL — CL (ref 1.5–2.5)
Magnesium: 1.6 mg/dL (ref 1.5–2.5)

## 2013-08-15 LAB — PHOSPHORUS: Phosphorus: 3.5 mg/dL (ref 2.3–4.6)

## 2013-08-15 LAB — POC OCCULT BLOOD, ED: Fecal Occult Bld: NEGATIVE

## 2013-08-15 MED ORDER — LORAZEPAM 2 MG/ML IJ SOLN: 1.0000 mg | Freq: Four times a day (QID) | INTRAMUSCULAR | Status: AC | PRN

## 2013-08-15 MED ORDER — CYANOCOBALAMIN 250 MCG PO TABS
250.0000 ug | ORAL_TABLET | Freq: Every evening | ORAL | Status: DC
  Administered 2013-08-16 – 2013-08-22 (×7): 250 ug via ORAL
  Filled 2013-08-15 (×8): qty 1

## 2013-08-15 MED ORDER — LORAZEPAM 2 MG/ML IJ SOLN: 0.0000 mg | Freq: Four times a day (QID) | INTRAMUSCULAR | Status: AC

## 2013-08-15 MED ORDER — MAGNESIUM SULFATE 40 MG/ML IJ SOLN
2.0000 g | Freq: Once | INTRAMUSCULAR | Status: AC
  Administered 2013-08-15: 2 g via INTRAVENOUS
  Filled 2013-08-15: qty 50

## 2013-08-15 MED ORDER — ADULT MULTIVITAMIN W/MINERALS CH
1.0000 | ORAL_TABLET | Freq: Every day | ORAL | Status: DC
  Administered 2013-08-16 – 2013-08-22 (×7): 1 via ORAL
  Filled 2013-08-15 (×8): qty 1

## 2013-08-15 MED ORDER — ASPIRIN EC 81 MG PO TBEC
81.0000 mg | DELAYED_RELEASE_TABLET | Freq: Every evening | ORAL | Status: DC
  Administered 2013-08-16 – 2013-08-17 (×2): 81 mg via ORAL
  Filled 2013-08-15 (×4): qty 1

## 2013-08-15 MED ORDER — SODIUM CHLORIDE 0.9 % IJ SOLN
3.0000 mL | Freq: Two times a day (BID) | INTRAMUSCULAR | Status: DC
  Administered 2013-08-20: 3 mL via INTRAVENOUS

## 2013-08-15 MED ORDER — PANCRELIPASE (LIP-PROT-AMYL) 12000-38000 UNITS PO CPEP
1.0000 | ORAL_CAPSULE | Freq: Three times a day (TID) | ORAL | Status: DC
  Administered 2013-08-15 – 2013-08-22 (×18): 1 via ORAL
  Filled 2013-08-15 (×24): qty 1

## 2013-08-15 MED ORDER — SODIUM CHLORIDE 0.9 % IV SOLN: 250.0000 mL | INTRAVENOUS | Status: DC | PRN

## 2013-08-15 MED ORDER — FLUOXETINE HCL 10 MG PO CAPS
10.0000 mg | ORAL_CAPSULE | Freq: Every day | ORAL | Status: DC
  Administered 2013-08-16 – 2013-08-22 (×7): 10 mg via ORAL
  Filled 2013-08-15 (×8): qty 1

## 2013-08-15 MED ORDER — HEPARIN SODIUM (PORCINE) 5000 UNIT/ML IJ SOLN
5000.0000 [IU] | Freq: Three times a day (TID) | INTRAMUSCULAR | Status: DC
Start: 2013-08-15 — End: 2013-08-18
  Administered 2013-08-15 – 2013-08-18 (×9): 5000 [IU] via SUBCUTANEOUS
  Filled 2013-08-15 (×12): qty 1

## 2013-08-15 MED ORDER — AMLODIPINE BESYLATE 5 MG PO TABS
5.0000 mg | ORAL_TABLET | Freq: Every day | ORAL | Status: DC
  Administered 2013-08-16: 5 mg via ORAL
  Filled 2013-08-15 (×3): qty 1

## 2013-08-15 MED ORDER — VITAMIN B-1 100 MG PO TABS
100.0000 mg | ORAL_TABLET | Freq: Every day | ORAL | Status: DC
  Administered 2013-08-16 – 2013-08-22 (×6): 100 mg via ORAL
  Filled 2013-08-15 (×8): qty 1

## 2013-08-15 MED ORDER — THIAMINE HCL 100 MG/ML IJ SOLN
100.0000 mg | Freq: Every day | INTRAMUSCULAR | Status: DC
  Administered 2013-08-20: 100 mg via INTRAVENOUS
  Filled 2013-08-15 (×8): qty 1

## 2013-08-15 MED ORDER — GABAPENTIN 300 MG PO CAPS: 600.0000 mg | ORAL_CAPSULE | Freq: Three times a day (TID) | ORAL | Status: DC

## 2013-08-15 MED ORDER — FUROSEMIDE 20 MG PO TABS
20.0000 mg | ORAL_TABLET | Freq: Every day | ORAL | Status: DC
  Administered 2013-08-16: 20 mg via ORAL
  Filled 2013-08-15 (×2): qty 1

## 2013-08-15 MED ORDER — SODIUM CHLORIDE 0.9 % IJ SOLN
3.0000 mL | Freq: Two times a day (BID) | INTRAMUSCULAR | Status: DC
  Administered 2013-08-15 – 2013-08-21 (×7): 3 mL via INTRAVENOUS

## 2013-08-15 MED ORDER — LORAZEPAM 2 MG/ML IJ SOLN
0.0000 mg | Freq: Two times a day (BID) | INTRAMUSCULAR | Status: DC
  Administered 2013-08-18: 1 mg via INTRAVENOUS
  Filled 2013-08-15: qty 1

## 2013-08-15 MED ORDER — FOLIC ACID 1 MG PO TABS
1.0000 mg | ORAL_TABLET | Freq: Every day | ORAL | Status: DC
  Administered 2013-08-16 – 2013-08-22 (×7): 1 mg via ORAL
  Filled 2013-08-15 (×8): qty 1

## 2013-08-15 MED ORDER — ENSURE COMPLETE PO LIQD
237.0000 mL | Freq: Two times a day (BID) | ORAL | Status: DC
  Administered 2013-08-15 – 2013-08-18 (×6): 237 mL via ORAL

## 2013-08-15 MED ORDER — CALCIUM CARBONATE ANTACID 500 MG PO CHEW
6.0000 | CHEWABLE_TABLET | Freq: Three times a day (TID) | ORAL | Status: DC
  Administered 2013-08-15 – 2013-08-22 (×20): 1200 mg via ORAL
  Filled 2013-08-15 (×24): qty 6

## 2013-08-15 MED ORDER — SODIUM CHLORIDE 0.9 % IJ SOLN: 3.0000 mL | INTRAMUSCULAR | Status: DC | PRN

## 2013-08-15 MED ORDER — LORAZEPAM 1 MG PO TABS
1.0000 mg | ORAL_TABLET | Freq: Four times a day (QID) | ORAL | Status: AC | PRN
  Administered 2013-08-16 – 2013-08-18 (×3): 1 mg via ORAL
  Filled 2013-08-15 (×3): qty 1

## 2013-08-15 MED ORDER — FERROUS SULFATE 325 (65 FE) MG PO TABS
325.0000 mg | ORAL_TABLET | Freq: Two times a day (BID) | ORAL | Status: DC
  Administered 2013-08-16 – 2013-08-22 (×13): 325 mg via ORAL
  Filled 2013-08-15 (×16): qty 1

## 2013-08-15 MED ORDER — PROMETHAZINE HCL 12.5 MG PO TABS: 12.5000 mg | ORAL_TABLET | Freq: Four times a day (QID) | ORAL | Status: DC | PRN

## 2013-08-15 MED ORDER — PROMETHAZINE HCL 25 MG/ML IJ SOLN
12.5000 mg | Freq: Four times a day (QID) | INTRAMUSCULAR | Status: DC | PRN
  Administered 2013-08-15 – 2013-08-21 (×3): 12.5 mg via INTRAVENOUS
  Filled 2013-08-15 (×3): qty 1

## 2013-08-15 MED ORDER — PANTOPRAZOLE SODIUM 40 MG PO TBEC
40.0000 mg | DELAYED_RELEASE_TABLET | Freq: Every day | ORAL | Status: DC
  Administered 2013-08-16 – 2013-08-18 (×3): 40 mg via ORAL
  Filled 2013-08-15 (×4): qty 1

## 2013-08-15 MED ORDER — GABAPENTIN 600 MG PO TABS
600.0000 mg | ORAL_TABLET | Freq: Three times a day (TID) | ORAL | Status: DC
  Administered 2013-08-15 – 2013-08-22 (×19): 600 mg via ORAL
  Filled 2013-08-15 (×23): qty 1

## 2013-08-15 NOTE — ED Notes (Signed)
Calcium 6.2, Magnesium 0.8 critical labs reported.

## 2013-08-15 NOTE — H&P (Signed)
Date: 08/15/2013               Patient Name:  [name] [name] MRN: [id number]  DOB: 1955-12-21 Age / Sex: 58 y.o., female   PCP: Otho Bellows, MD         Medical Service: Internal Medicine Teaching Service         Attending Physician: Dr. Axel Filler, MD    First Contact: Dr. Lesly Dukes Pager: [phone]  Second Contact: Dr. Randell Loop Pager: [phone]       After Hours (After 5p/  First Contact Pager: [phone]  weekends / holidays): Second Contact Pager: [phone]   Chief Complaint: Electrolyte abnormality  History of Present Illness:  [name] [name] is a 58 year old female with a history of EtOH abuse, chronic pancreatitis, protein calorie malnutrition, hypocalcemia and hypomagnesemia, HTN, CKD3 who presents to the ED because she was instructed to do so by the [facility] for low magnesium.  Patient was seen in the clinic for repeat lab work yesterday, 08/14/2013. CMP showed multiple electrolyte abnormalities including magnesium 0.9, calcium 6.2. She was instructed to immediately go to the emergency room for IV magnesium repletion. She told the procider she would "think about coming," and subsequently presented today.  The patient tells [place] she feels fine. She denies headache, weakness, numbness, chest pain, palpitations, cough, abdominal pain, nausea, vomiting, diarrhea. She initially tells [place] she has been compliant with her magnesium supplementation 4 times a day, then later states she "sometimes skips days."   She appears emaciated. She has not been needing or drinking well. Her typical meal pattern is drinking "water and wine, sometimes a sandwich." She used to drink Ensure, but tells [place] she ran out. Her last bowel movement was yesterday, it was normal and nonbloody.  She drinks wine everyday. She says she cut her intake down to 3 glasses per day in December. Prior to that time, she drank "all day every day." She denies using other illicit drugs or tobacco products. She tells [place]  she gets "sweaty and shaky" when she does not drink. 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   In the ED, she was given 2 g of IV magnesium sulfate.   Meds:  Current Outpatient Prescriptions  Medication Sig Dispense Refill  . amLODipine (NORVASC) 10 MG tablet Take 0.5 tablets (5 mg total) by mouth daily. For hypertension  90 tablet  4  . aspirin EC 81 MG tablet Take 1 tablet (81 mg total) by mouth every evening.  90 tablet  3  . calcium carbonate (TUMS) 500 MG chewable tablet Chew 6 tablets (1,200 mg of elemental calcium total) by mouth 3 (three) times daily. For bone health  90 tablet  3  . capsaicin (ZOSTRIX) 0.025 % cream Apply topically 2 (two) times daily.  45 g  0  . cetirizine (ZYRTEC) 10 MG tablet Take 1 tablet (10 mg total) by mouth daily.  90 tablet  3  . diclofenac sodium (VOLTAREN) 1 % GEL Apply 2 g topically 2 (two) times daily. To affected area  100 g  0  . ferrous sulfate 325 (65 FE) MG tablet Take 1 tablet (325 mg total) by mouth 2 (two) times daily with a meal.  180 tablet  3  . FLUoxetine (PROZAC) 10 MG capsule Take 1 capsule (10 mg total) by mouth daily. For depression  90 capsule  4  . folic acid (FOLVITE) 1 MG tablet Take 1 tablet (1 mg total)  by mouth daily. For folic acid replacement  30 tablet  3  . furosemide (LASIX) 20 MG tablet Take 1 tablet (20 mg total) by mouth daily.  30 tablet  1  . gabapentin (NEURONTIN) 300 MG capsule Take 2 capsules (600 mg total) by mouth 3 (three) times daily. For anxiety/pain control  540 capsule  1  . lipase/protease/amylase (CREON-12/PANCREASE) 12000 UNITS CPEP capsule Take 1 capsule by mouth 3 (three) times daily before meals.  270 capsule  3  . magnesium oxide (MAG-OX) 400 MG tablet Take 1 tablet (400 mg total) by mouth 4 (four) times daily.  120 tablet  3  . meclizine (ANTIVERT) 50 MG tablet Take 1 tablet (50 mg total) by mouth 3 (three) times daily as needed for dizziness.  30 tablet  0  . Multiple Vitamin (MULTIVITAMIN  WITH MINERALS) TABS tablet Take 1 tablet by mouth daily. For vitamin replacement  90 tablet  3  . Nutritional Supplements (ENSURE HIGH PROTEIN PO) Take 1 Can by mouth 3 (three) times daily. chocolate      . omeprazole (PRILOSEC) 40 MG capsule Take 1 capsule (40 mg total) by mouth daily.  90 capsule  4  . potassium chloride SA (KLOR-CON M20) 20 MEQ tablet Take 1 tablet (20 mEq total) by mouth daily. For low potassium  90 tablet  0  . thiamine (VITAMIN B-1) 100 MG tablet Take 1 tablet (100 mg total) by mouth daily. For low thiamine  90 tablet  0  . vitamin B-12 (CYANOCOBALAMIN) 250 MCG tablet Take 1 tablet (250 mcg total) by mouth every evening.  90 tablet  3  . Vitamin D, Ergocalciferol, (DRISDOL) 50000 UNITS CAPS capsule Take 1 capsule (50,000 Units total) by mouth every 7 (seven) days. Thursdays: For bone health  4 capsule  3  . Disposable Gloves (BD VINYL GLOVES LARGE/X-LARGE) MISC 1 Container by Does not apply route once.  50 each  0    Allergies: Allergies as of 08/15/2013 - Review Complete 08/15/2013  Allergen Reaction Noted  . Amitriptyline hcl Swelling   . Doxycycline hyclate Itching 11/10/2010   Past Medical History  Diagnosis Date  . Anemia, B12 deficiency   . History of acute pancreatitis   . Right knee pain     No recent imaging on chart  . 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 Dr. Mancel Bale, [facility]  . Hypertriglyceridemia   . GERD (gastroesophageal reflux disease)   . Vitamin D deficiency   . Subdural hematoma 02/2008    Likely 2/2 trauma from seizure from EtOH withdrawal, chronic in nature, sees Dr. Jerene Bears. Most recent CT head 10/2009 showing stable but persistent hematoma without mass effect.  . History of seizure disorder     Likely 2/2 alcohol abuse  . Hypocalcemia   . Hypomagnesemia   . Failure to thrive in childhood     Unclear etiology  . HTN (hypertension)   . Thrombocytopenia   . Anemia,  macrocytic   . Hepatomegaly     On exam  . Alcohol abuse   . Joint pain   . Alcohol abuse   . Arthritis   . Vitamin D deficiency   . Menopause   . Pancreatitis   . Insomnia   . Hyperlipidemia   . Sinusitis   . Pernicious anemia   . Subdural hematoma   . Macrocytic anemia   . Hepatomegaly   . Tuberculosis  AS CHILD MED TX  . Seizures     1.5 YRS  LAST ONE  . Depression   . Fx humeral neck 04/17/2011    Transverse fracture- minimally displaced- managed as outpatient   . ABNORMAL PAP SMEAR, LGSIL 07/23/2008    Annotation: HPV positive CIN I Dr. Mancel Bale, [facility] Qualifier: Diagnosis of  By: Oretha Ellis    . Pneumonia 05/20/2012  . Hepatitis     HEPATOMEGALY    Past Surgical History  Procedure Laterality Date  . Cesarean section  1983  . Esophagogastroduodenoscopy  07/11/2011    Procedure: ESOPHAGOGASTRODUODENOSCOPY (EGD);  Surgeon: Beryle Beams, MD;  Location: [facility];  Service: Endoscopy;  Laterality: N/A;  . Colonoscopy  07/11/2011    Procedure: COLONOSCOPY;  Surgeon: Beryle Beams, MD;  Location: [facility];  Service: Endoscopy;  Laterality: N/A;  . Eye surgery      LEFT EYE YRS AGO   . Rt colectomy  08/28/2011  . Esophagogastroduodenoscopy N/A 12/01/2012    Procedure: ESOPHAGOGASTRODUODENOSCOPY (EGD);  Surgeon: Irene Shipper, MD;  Location: [facility];  Service: Endoscopy;  Laterality: N/A;   Family History  Problem Relation Age of Onset  . Cancer Mother     Died from stomach cancer and "flesh eating rash  . Heart failure Father     Died in 75s from an MI  . Alcohol abuse Sister     Twin sister drinks a lot, as did both her parents and brothers  . Stroke Brother     Has 7 brothers, 1 with CVA   History   Social History  . Marital Status: Divorced    Spouse Name: N/A    Number of Children: N/A  . Years of Education: N/A   Occupational History  . Not on file.   Social History Main Topics  . Smoking status: Former  Smoker    Types: Cigarettes    Quit date: 09/20/2010  . Smokeless tobacco: Never Used  . Alcohol Use: 6.0 oz/week    10 Glasses of wine per week     Comment: No alocohol x 3 weeks  . Drug Use: No     Comment: HX  USE   . Sexual Activity: Not Currently   Other Topics Concern  . Not on file   Social History Narrative   Lives with her significant other and 2 grandchildren. 1 child   Has 7 brothers and 4 sisters, 1 twin sister.   Unemployed, worked in [employer].    Abuses alcohol-drinks 1 glass of wine daily    No drug use. Former cigarette use quit 1.5 years ago.     11 th grade education             Review of Systems: Pertinent items are noted in HPI.  Physical Exam: Blood pressure 127/86, pulse 107, temperature 98.2 F (36.8 C), temperature source Oral, resp. rate 19, SpO2 97.00%. Body mass index is 15.63 kg/(m^2). Physical Exam  Constitutional: She is oriented to person, place, and time. She appears malnourished. No distress.  HENT:  Head: Normocephalic and atraumatic.  Eyes: Conjunctivae and EOM are normal. Pupils are equal, round, and reactive to light.  Neck: Normal range of motion. Neck supple.  Cardiovascular: Regular rhythm.  Tachycardia present.  Exam reveals no gallop and no friction rub.   No murmur heard. Pulmonary/Chest: Effort normal. No respiratory distress. She has no wheezes. She has rales (Velco-like, high pitched inspiratory breath sounds noted in  right lower lung field). She exhibits no tenderness.  Abdominal: Soft. Bowel sounds are normal. She exhibits no distension. There is no tenderness.  Musculoskeletal: Normal range of motion. She exhibits no edema and no tenderness.  Neurological: She is alert and oriented to person, place, and time. No cranial nerve deficit. GCS score is 15.  Skin: Skin is warm and dry.  Psychiatric: She is apathetic. She has a flat affect.    Lab results: Basic Metabolic Panel:  Recent Labs  08/14/13 0958 08/15/13 1038   NA 135 136*  K 3.9 4.2  CL 103 104  CO2 14* 14*  GLUCOSE 148* 110*  BUN 20 20  CREATININE 1.21* 1.08  CALCIUM 6.2* 6.2*  MG 0.9* 0.8*   Liver Function Tests:  Recent Labs  08/14/13 0958 08/15/13 1038  AST 133* 157*  ALT 25 27  ALKPHOS 471* 475*  BILITOT 0.3 0.4  PROT 7.8 7.2  ALBUMIN 1.9* 1.8*    Recent Labs  08/15/13 1038  LIPASE 8*   CBC:  Recent Labs  08/15/13 1038  WBC 6.3  NEUTROABS 3.7  HGB 8.0*  HCT 23.3*  MCV 98.7  PLT 148*   Alcohol Level:  Recent Labs  08/14/13 0958 08/15/13 1038  ETH 244* 169*    Imaging results:  Dg Chest 2 View  08/15/2013   CLINICAL DATA:  Abnormal right lower chest breath sounds, evaluate for pneumonia  EXAM: CHEST  2 VIEW  COMPARISON:  12/06/2012  FINDINGS: Normal heart size. Increased mild basilar interstitial opacities, more pronounced in the right lower lobe. This can be seen with early edema. Stable heart size. No large effusion or pneumothorax. Trachea midline. Deformities of the humeral necks bilaterally from prior fractures. Trachea is midline.  IMPRESSION: Asymmetric bibasilar interstitial opacities concerning for minimal basilar edema.   Electronically Signed   By: Daryll Brod M.D.   On: 08/15/2013 13:47    Other results: EKG: Unchanged from previous tracings, sinus tachycardia. Q waves V1-V2, old. QTc 475.  Assessment & Plan by Problem: ARABELA EIRING is a 58 year old female with a history of EtOH abuse, chronic pancreatitis, protein calorie malnutrition, hypocalcemia and hypomagnesemia, HTN, CKD3 who presents to the ED because she was instructed to do so by the [facility] for low magnesium.  #Hypomagnesemia - Patient has a known history of hypomagnesemia due to very poor nutrition and chronic alcohol abuse. She is supposed to be taking magnesium oxide 400 mg 4 times a day, but we suspect noncompliance. Potassium is within normal limits at 4.2. She is status post magnesium sulfate 2 g IV in the ED. - Admit to  IMTS, telemetry bed, observation - Getting another magnesium sulfate 2 g IV - Cardiac monitoring for arrhythmias - Repeat BMP, magnesium, phos at 7pm and replete as needed - CMP, phos, CBC in am  Magnesium  Date Value Ref Range Status  08/15/2013 0.8* 1.5 - 2.5 mg/dL Final     CRITICAL RESULT CALLED TO, READ BACK BY AND VERIFIED WITH:     [facility] AT 1137 08/15/13 BY K BARR  08/14/2013 0.9* 1.5 - 2.5 mg/dL Final  08/08/2013 0.7* 1.5 - 2.5 mg/dL Final  06/24/2013 1.1* 1.5 - 2.5 mg/dL Final  06/10/2013 1.0* 1.5 - 2.5 mg/dL Final    #Hypocalcemia in the setting of hypoalbuminemia - Also a chronic problem. Corrected calcium is 8 given her hypoalbuminemia. 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  - Continue  home calcium carbonate 500mg  6 tabs (1200 mg elemental calcium) 3 times a day - CMP in am  Calcium  Date Value Ref Range Status  08/15/2013 6.2* 8.4 - 10.5 mg/dL Final     CRITICAL RESULT CALLED TO, READ BACK BY AND VERIFIED WITH:     [facility] FLACK,RN AT 1137 08/15/13 BY K BARR  08/14/2013 6.2* 8.4 - 10.5 mg/dL Final  08/08/2013 5.9* 8.4 - 10.5 mg/dL Final  06/24/2013 7.5* 8.4 - 10.5 mg/dL Final  06/10/2013 7.5* 8.4 - 10.5 mg/dL Final    #Abnormal breath sounds in right lower lung field - Patient denies shortness of breath, cough. In July 2014, she was admitted to the ICU with severe sepsis due to aspiration pneumonia and intubated for respiratory failure. CXR 12/05/12 noted developing linear opacity in the inferior right upper lobe may reflect additional atelectasis or infiltrate. She also had a transudative pleural effusion which was tapped (no documentation of which side). Thus her lung sounds may reflect scarring vs. Pneumonitis from recurrent aspiration. CXR here showed asymmetric bibasilar interstitial opacities, right > left, concerning for minimal early basilar edema. 2D echo in 07/14 was normal, EF 55-60%. - Continue to monitor for  signs/symptoms of respiratory issues or volume overload  #Alcohol abuse - Patient is a current every day drinker. EtOh elevated on admission to 169. She says her last drink was last night. She has a history of delirium tremens and alcohol withdrawal seizures. - CIWA protocol - Folate, thiamine, multivitamin - Checking UDS  #Severe protein calorie malnutrition - Albumin 1.8. She admits to most of her daily po intake being "water and wine." - Regular diet - Ensure feeding supplements twice a day between meals - Consult to dietician  #Normocytic anemia - Baseline hemoglobin 8.8-10.7. She has a history of B12 deficiency and folate deficiency causing macrocytic anemia. She is also on iron supplementation for a history of iron deficiency anemia. She may have a mixed process here causing normocytic anemia. She is not compliant with her medicines. FOBT negative in the ED. - Continue home vitamin B12 250 mcg every evening - Continue home ferrous sulfate 325 mg twice a day with meals - Folate, thiamine, multivitamin  Hemoglobin  Date Value Ref Range Status  08/15/2013 8.0* 12.0 - 15.0 g/dL Final  06/10/2013 10.1* 12.0 - 15.0 g/dL Final  05/21/2013 9.6* 12.0 - 15.0 g/dL Final  01/15/2013 10.7* 12.0 - 15.0 g/dL Final  12/09/2012 8.8* 12.0 - 15.0 g/dL Final    #CKD3 - Baseline Cr 1.2-1.3 Currently 1.08. - Continue to monitor  Creat  Date Value Ref Range Status  08/14/2013 1.21* 0.50 - 1.10 mg/dL Final  08/08/2013 1.28* 0.50 - 1.10 mg/dL Final  06/24/2013 1.36* 0.50 - 1.10 mg/dL Final  06/10/2013 1.32* 0.50 - 1.10 mg/dL Final     Creatinine, Ser  Date Value Ref Range Status  08/15/2013 1.08  0.50 - 1.10 mg/dL Final    #Chronic anion gap metabolic acidosis - AG 18, stable. CO2 is chronically depressed at 14, likely related to CKD vs. Alcohol abuse. - UA for ketones - Continue to monitor  #Chronic pancreatitis - Stable. Patient denies abdominal pain, nausea, vomiting, diarrhea. - Continue home  Creon  #Depression - Apathy and blunted affect noted on exam. - Continue home Prozac 10 mg daily  #HTN - Currently normotensive. - Continue home amlodipine 5 mg daily - Continue home Lasix 20 mg daily - Continue home aspirin 81 mg daily  #Tachycardia - A chronic issue per chart  review. Afebrile. Normotensive. - Continue to monitor on telemetry  #GERD - Continue home Protonix 40 mg daily  #Neuropathic pain/anxiety - Continue home gabapentin 600 mg 3 times a day  #DVT PPX - subcutaneous heparin  #Code status - Full  Dispo: Disposition is deferred at this time, awaiting improvement of current medical problems. Anticipated discharge in approximately 1-3 day(s).   The patient does have a current PCP Otho Bellows, MD) and does need an [facility] hospital follow-up appointment after discharge.  The patient does not have transportation limitations that hinder transportation to clinic appointments.  Signed: Lesly Dukes, MD 08/15/2013, 2:05 PM   Lesly Dukes, MD  [email or web address] Pager # [phone] Office # [phone]

## 2013-08-15 NOTE — ED Provider Notes (Signed)
CSN: [id number]     Arrival date & time 08/15/13  0907 History   First MD Initiated Contact with Patient 08/15/13 [phone]     Chief Complaint  Patient presents with  . Labs Only     (Consider location/radiation/quality/duration/timing/severity/associated sxs/prior Treatment) HPI Comments: Patient is a 58 year old female with history of anemia, B12 deficiency, pancreatitis, subdural hematoma, hypertriglyceridemia, seizures, hypocalcemia, hypomagnesemia, HTN, thrombocytopenia, hepatomegaly, alcohol abuse, hepatitis who presents today after being told by her PCP to come to the ED. She was there for a routine check up and blood work was drawn. Her PCP called yesterday to tell him to come to the ED because her magnesium was critically low. She did not want to come yesterday because she did not have time. She currently denies any symptoms stating "I feel fine". Specifically she denies headache, lightheadedness, dizziness, shortness of breath, chest pain, palpitations, nausea, vomiting, abdominal pain, diarrhea, diaphoresis. She drinks alcohol daily, but states she drinks less than she used to. She admits to drinking "a few glasses of wine" every day. She last drank last night.   The history is provided by the patient. No language interpreter was used.    Past Medical History  Diagnosis Date  . Anemia, B12 deficiency   . History of acute pancreatitis   . Right knee pain     No recent imaging on chart  . 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 Dr. Mancel Bale, [facility]  . Hypertriglyceridemia   . GERD (gastroesophageal reflux disease)   . Vitamin D deficiency   . Subdural hematoma 02/2008    Likely 2/2 trauma from seizure from EtOH withdrawal, chronic in nature, sees Dr. Jerene Bears. Most recent CT head 10/2009 showing stable but persistent hematoma without mass effect.  . History of seizure disorder     Likely 2/2 alcohol abuse  .  Hypocalcemia   . Hypomagnesemia   . Failure to thrive in childhood     Unclear etiology  . HTN (hypertension)   . Thrombocytopenia   . Anemia, macrocytic   . Hepatomegaly     On exam  . Alcohol abuse   . Joint pain   . Alcohol abuse   . Arthritis   . Vitamin D deficiency   . Menopause   . Pancreatitis   . Insomnia   . Hyperlipidemia   . Sinusitis   . Pernicious anemia   . Subdural hematoma   . Macrocytic anemia   . Hepatomegaly   . 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 Seizures     1.5 YRS  LAST ONE  . Depression   . Fx humeral neck 04/17/2011    Transverse fracture- minimally displaced- managed as outpatient   . ABNORMAL PAP SMEAR, LGSIL 07/23/2008    Annotation: HPV positive CIN I Dr. Mancel Bale, [facility] Qualifier: Diagnosis of  By: Oretha Ellis    . Pneumonia 05/20/2012  . Hepatitis     HEPATOMEGALY    Past Surgical History  Procedure Laterality Date  . Cesarean section  1983  . Esophagogastroduodenoscopy  07/11/2011    Procedure: ESOPHAGOGASTRODUODENOSCOPY (EGD);  Surgeon: Beryle Beams, MD;  Location: [facility];  Service: Endoscopy;  Laterality: N/A;  . Colonoscopy  07/11/2011    Procedure: COLONOSCOPY;  Surgeon: Beryle Beams, MD;  Location: [facility];  Service: Endoscopy;  Laterality: N/A;  . Eye surgery  LEFT EYE YRS AGO   . Rt colectomy  08/28/2011  . Esophagogastroduodenoscopy N/A 12/01/2012    Procedure: ESOPHAGOGASTRODUODENOSCOPY (EGD);  Surgeon: Irene Shipper, MD;  Location: [facility];  Service: Endoscopy;  Laterality: N/A;   Family History  Problem Relation Age of Onset  . Cancer Mother     Died from stomach cancer and "flesh eating rash  . Heart failure Father     Died in 46s from an MI  . Alcohol abuse Sister     Twin sister drinks a lot, as did both her parents and brothers  . Stroke Brother     Has 7 brothers, 1 with CVA   History  Substance Use Topics  . Smoking status: Former Smoker    Types: Cigarettes     Quit date: 09/20/2010  . Smokeless tobacco: Never Used  . Alcohol Use: 6.0 oz/week    10 Glasses of wine per week     Comment: No alocohol x 3 weeks   OB History   Grav Para Term Preterm Abortions TAB SAB Ect Mult Living                 Review of Systems  Constitutional: Negative for fever, chills and diaphoresis.  Respiratory: Negative for cough and shortness of breath.   Cardiovascular: Negative for chest pain, palpitations and leg swelling.  Gastrointestinal: Negative for nausea, vomiting and abdominal pain.  Neurological: Negative for dizziness, numbness and headaches.  All other systems reviewed and are negative.      Allergies  Amitriptyline hcl and Doxycycline hyclate  Home Medications   Current Outpatient Rx  Name  Route  Sig  Dispense  Refill  . amLODipine (NORVASC) 10 MG tablet   Oral   Take 0.5 tablets (5 mg total) by mouth daily. For hypertension   90 tablet   4   . aspirin EC 81 MG tablet   Oral   Take 1 tablet (81 mg total) by mouth every evening.   90 tablet   3   . calcium carbonate (TUMS) 500 MG chewable tablet   Oral   Chew 6 tablets (1,200 mg of elemental calcium total) by mouth 3 (three) times daily. For bone health   90 tablet   3   . capsaicin (ZOSTRIX) 0.025 % cream   Topical   Apply topically 2 (two) times daily.   45 g   0   . cetirizine (ZYRTEC) 10 MG tablet   Oral   Take 1 tablet (10 mg total) by mouth daily.   90 tablet   3   . diclofenac sodium (VOLTAREN) 1 % GEL   Topical   Apply 2 g topically 2 (two) times daily. To affected area   100 g   0   . Disposable Gloves (BD VINYL GLOVES LARGE/X-LARGE) MISC   Does not apply   1 Container by Does not apply route once.   50 each   0   . ferrous sulfate 325 (65 FE) MG tablet   Oral   Take 1 tablet (325 mg total) by mouth 2 (two) times daily with a meal.   180 tablet   3   . FLUoxetine (PROZAC) 10 MG capsule   Oral   Take 1 capsule (10 mg total) by mouth daily. For  depression   90 capsule   4   . folic acid (FOLVITE) 1 MG tablet   Oral   Take 1 tablet (1 mg total) by mouth daily. For  folic acid replacement   30 tablet   3   . furosemide (LASIX) 20 MG tablet   Oral   Take 1 tablet (20 mg total) by mouth daily.   30 tablet   1   . gabapentin (NEURONTIN) 300 MG capsule   Oral   Take 2 capsules (600 mg total) by mouth 3 (three) times daily. For anxiety/pain control   540 capsule   1   . lipase/protease/amylase (CREON-12/PANCREASE) 12000 UNITS CPEP capsule   Oral   Take 1 capsule by mouth 3 (three) times daily before meals.   270 capsule   3   . magnesium oxide (MAG-OX) 400 MG tablet   Oral   Take 1 tablet (400 mg total) by mouth 4 (four) times daily.   120 tablet   3   . meclizine (ANTIVERT) 50 MG tablet   Oral   Take 1 tablet (50 mg total) by mouth 3 (three) times daily as needed for dizziness.   30 tablet   0   . Multiple Vitamin (MULTIVITAMIN WITH MINERALS) TABS tablet   Oral   Take 1 tablet by mouth daily. For vitamin replacement   90 tablet   3   . Nutritional Supplements (ENSURE HIGH PROTEIN PO)   Oral   Take 1 Can by mouth 3 (three) times daily. chocolate         . omeprazole (PRILOSEC) 40 MG capsule   Oral   Take 1 capsule (40 mg total) by mouth daily.   90 capsule   4   . potassium chloride SA (KLOR-CON M20) 20 MEQ tablet   Oral   Take 1 tablet (20 mEq total) by mouth daily. For low potassium   90 tablet   0   . thiamine (VITAMIN B-1) 100 MG tablet   Oral   Take 1 tablet (100 mg total) by mouth daily. For low thiamine   90 tablet   0   . vitamin B-12 (CYANOCOBALAMIN) 250 MCG tablet   Oral   Take 1 tablet (250 mcg total) by mouth every evening.   90 tablet   3   . Vitamin D, Ergocalciferol, (DRISDOL) 50000 UNITS CAPS capsule   Oral   Take 1 capsule (50,000 Units total) by mouth every 7 (seven) days. Thursdays: For bone health   4 capsule   3    BP 126/83  Pulse 98  Temp(Src) 98.2 F (36.8  C) (Oral)  Resp 20  Ht 5\' 1"  (1.549 m)  Wt 82 lb 10.8 oz (37.5 kg)  BMI 15.63 kg/m2  SpO2 96% Physical Exam  Nursing note and vitals reviewed. Constitutional: She is oriented to person, place, and time. She appears well-developed. No distress.  Malnourished, petite.   HENT:  Head: Normocephalic and atraumatic.  Right Ear: External ear normal.  Left Ear: External ear normal.  Nose: Nose normal.  Mouth/Throat: Oropharynx is clear and moist.  Eyes: Conjunctivae are normal.  Neck: Normal range of motion.  Cardiovascular: Regular rhythm and normal heart sounds.  Tachycardia present.   Pulmonary/Chest: Effort normal and breath sounds normal. No stridor. No respiratory distress. She has no wheezes. She has no rales.  Abdominal: Soft. She exhibits no distension. There is no tenderness.  Musculoskeletal: Normal range of motion.  Neurological: She is alert and oriented to person, place, and time. She has normal strength.  Skin: Skin is warm and dry. She is not diaphoretic. No erythema.  Psychiatric: Her behavior is normal.  Flat affect  ED Course  Procedures (including critical care time) Labs Review Labs Reviewed  MAGNESIUM - Abnormal; Notable for the following:    Magnesium 0.8 (*)    All other components within normal limits  COMPREHENSIVE METABOLIC PANEL - Abnormal; Notable for the following:    Sodium 136 (*)    CO2 14 (*)    Glucose, Bld 110 (*)    Calcium 6.2 (*)    Albumin 1.8 (*)    AST 157 (*)    Alkaline Phosphatase 475 (*)    GFR calc non Af Amer 56 (*)    GFR calc Af Amer 65 (*)    All other components within normal limits  CBC WITH DIFFERENTIAL - Abnormal; Notable for the following:    RBC 2.36 (*)    Hemoglobin 8.0 (*)    HCT 23.3 (*)    Platelets 148 (*)    All other components within normal limits  ETHANOL - Abnormal; Notable for the following:    Alcohol, Ethyl (B) 169 (*)    All other components within normal limits  LIPASE, BLOOD - Abnormal; Notable  for the following:    Lipase 8 (*)    All other components within normal limits  URINE RAPID DRUG SCREEN (HOSP PERFORMED)  MAGNESIUM  BASIC METABOLIC PANEL  URINALYSIS, ROUTINE W REFLEX MICROSCOPIC  POC OCCULT BLOOD, ED   Imaging Review Dg Chest 2 View  08/15/2013   CLINICAL DATA:  Abnormal right lower chest breath sounds, evaluate for pneumonia  EXAM: CHEST  2 VIEW  COMPARISON:  12/06/2012  FINDINGS: Normal heart size. Increased mild basilar interstitial opacities, more pronounced in the right lower lobe. This can be seen with early edema. Stable heart size. No large effusion or pneumothorax. Trachea midline. Deformities of the humeral necks bilaterally from prior fractures. Trachea is midline.  IMPRESSION: Asymmetric bibasilar interstitial opacities concerning for minimal basilar edema.   Electronically Signed   By: Daryll Brod M.D.   On: 08/15/2013 13:47     EKG Interpretation None      MDM   Final diagnoses:  Hypomagnesemia    Patient presents to ED after being sent by her PCP. Magnesium is 0.8, calcium is 6.2. Patient is also anemic. Hemoccult negative. Due to significant electrolyte abnormalities will need admission for repletion. Discussed case with internal medicine who will admit patient. Admission is appreciated. Vital signs stable at this time. Discussed case with Dr. Tawnya Crook who agrees with plan. Patient / Family / Caregiver informed of clinical course, understand medical decision-making process, and agree with plan.   Elwyn Lade, PA-C 08/15/13 1625

## 2013-08-15 NOTE — ED Notes (Signed)
Pt  Transported to radiology. 

## 2013-08-15 NOTE — ED Notes (Signed)
Admitting doctors at bedside.

## 2013-08-15 NOTE — Progress Notes (Signed)
Patient vomited 1x clear emesis. Paged MD to order zofran.

## 2013-08-15 NOTE — Progress Notes (Signed)
INITIAL NUTRITION ASSESSMENT  DOCUMENTATION CODES Per approved criteria  -Severe  malnutrition in the context of social or environmental circumstances -Underweight   INTERVENTION: Monitor magnesium, potassium, and phosphorus daily for at least 3 days and/or throughout nutrition repletion, MD to replete as needed, as pt is at risk for refeeding syndrome given severe malnutrition. Continue Ensure Complete PO BID. Agree with Regular, liberalized diet. Please refer patient to outpatient Internal Medicine RD when d/c. RD to continue to follow nutrition care plan.  NUTRITION DIAGNOSIS: Malnutrition related to ETOH abuse as evidenced by severe fat and muscle mass loss..   Goal: Intake to meet >90% of estimated nutrition needs.  Monitor:  weight trends, lab trends, I/O's, PO intake, supplement tolerance  Reason for Assessment: Malnutrition Screening Tool + MD Consult  58 y.o. female  Admitting Dx: low magnesium  ASSESSMENT: PMHx significant for ETOH abuse, chronic pancreatitis, malnutrition, hypocalcemia, hypomagnesemia, HTN, CKD3. Admitted with low magnesium s/p outpatient appointment.   Pt reported on admission that she drinks water and wine, and sometimes a sandwich. Used to drink Ensure but she recently ran out. Drinks wine daily. Pt tells this RD that she cannot remember the last time she ate PTA. Pt ate a few bites of a hamburger last night while being admitted. Agreeable to drinking Ensure Complete while admitted. Pt with flat affect, and also noted to be apathetic by team.  Pt with hx of magnesium deficiency and is prescribed daily supplementation but team suspects noncompliance.  Nutrition Focused Physical Exam:  Subcutaneous Fat:  Orbital Region: severe depletion Upper Arm Region: severe depletion Thoracic and Lumbar Region: severe depletion  Muscle:  Temple Region: severe depletion Clavicle Bone Region: severe depletion Clavicle and Acromion Bone Region: severe  depletion Scapular Bone Region: severe depletion Dorsal Hand: severe depletion Patellar Region: severe depletion Anterior Thigh Region: severe depletion Posterior Calf Region: severe depletion  Edema: none  Pt meets criteria for severe MALNUTRITION in the context of social/environmental circumstances as evidenced by severe fat and muscle mass loss, intake of <50% x at least 3 months.  Magnesium is low at 0.8 Meds of note include: ferrous sulfate, folic acid, lasix, creon, IV mag sulfate, MVI, thiamine, vitamin B-12  Height: Ht Readings from Last 1 Encounters:  08/15/13 5\' 1"  (1.549 m)    Weight: Wt Readings from Last 1 Encounters:  08/15/13 82 lb 10.8 oz (37.5 kg)    Ideal Body Weight: 105 lb  % Ideal Body Weight: 78%  Wt Readings from Last 20 Encounters:  08/15/13 82 lb 10.8 oz (37.5 kg)  08/07/13 82 lb 11.2 oz (37.512 kg)  06/10/13 83 lb 1.6 oz (37.694 kg)  06/02/13 83 lb (37.649 kg)  05/21/13 83 lb 4.8 oz (37.785 kg)  03/20/13 87 lb 12.8 oz (39.826 kg)  01/30/13 87 lb (39.463 kg)  12/07/12 112 lb 10.5 oz (51.1 kg)  12/07/12 112 lb 10.5 oz (51.1 kg)  12/07/12 112 lb 10.5 oz (51.1 kg)  11/26/12 84 lb (38.102 kg)  11/12/12 93 lb 4.8 oz (42.321 kg)  11/06/12 83 lb (37.649 kg)  11/06/12 83 lb (37.649 kg)  11/06/12 83 lb 12.8 oz (38.011 kg)  10/30/12 83 lb 3.2 oz (37.739 kg)  10/08/12 87 lb 6.4 oz (39.644 kg)  08/14/12 83 lb 11.2 oz (37.966 kg)  08/09/12 99 lb 6.8 oz (45.1 kg)  08/07/12 99 lb 6.4 oz (45.088 kg)   Usual Body Weight: 83 lb  % Usual Body Weight: 99%  BMI:  15.6 - underweight  Estimated Nutritional Needs: Kcal: 1550 - 1700 Protein: at least 57 g daily Fluid: 1.3 - 1.6 liters daily  Skin: intact  Diet Order: General  EDUCATION NEEDS: -No education needs identified at this time  No intake or output data in the 24 hours ending 08/15/13 1518  Last BM: PTA  Labs:   Recent Labs Lab 08/14/13 0958 08/15/13 1038  NA 135 136*  K 3.9 4.2   CL 103 104  CO2 14* 14*  BUN 20 20  CREATININE 1.21* 1.08  CALCIUM 6.2* 6.2*  MG 0.9* 0.8*  GLUCOSE 148* 110*    CBG (last 3)  No results found for this basename: GLUCAP,  in the last 72 hours  Scheduled Meds: . amLODipine  5 mg Oral Daily  . aspirin EC  81 mg Oral QPM  . calcium carbonate  6 tablet Oral TID  . feeding supplement (ENSURE COMPLETE)  237 mL Oral BID BM  . ferrous sulfate  325 mg Oral BID WC  . FLUoxetine  10 mg Oral Daily  . folic acid  1 mg Oral Daily  . furosemide  20 mg Oral Daily  . gabapentin  600 mg Oral TID  . heparin  5,000 Units Subcutaneous 3 times per day  . lipase/protease/amylase  1 capsule Oral TID AC  . LORazepam  0-4 mg Intravenous Q6H   Followed by  . [START ON 08/17/2013] LORazepam  0-4 mg Intravenous Q12H  . magnesium sulfate 1 - 4 g bolus IVPB  2 g Intravenous Once  . multivitamin with minerals  1 tablet Oral Daily  . pantoprazole  40 mg Oral Daily  . sodium chloride  3 mL Intravenous Q12H  . sodium chloride  3 mL Intravenous Q12H  . thiamine  100 mg Oral Daily   Or  . thiamine  100 mg Intravenous Daily  . vitamin B-12  250 mcg Oral QPM    Continuous Infusions:  Past Medical History  Diagnosis Date  . Anemia, B12 deficiency   . History of acute pancreatitis   . Right knee pain     No recent imaging on chart  . 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 Dr. Mancel Bale, [facility]  . Hypertriglyceridemia   . GERD (gastroesophageal reflux disease)   . Vitamin D deficiency   . Subdural hematoma 02/2008    Likely 2/2 trauma from seizure from EtOH withdrawal, chronic in nature, sees Dr. Jerene Bears. Most recent CT head 10/2009 showing stable but persistent hematoma without mass effect.  . History of seizure disorder     Likely 2/2 alcohol abuse  . Hypocalcemia   . Hypomagnesemia   . Failure to thrive in childhood     Unclear etiology  . HTN (hypertension)   .  Thrombocytopenia   . Anemia, macrocytic   . Hepatomegaly     On exam  . Alcohol abuse   . Joint pain   . Alcohol abuse   . Arthritis   . Vitamin D deficiency   . Menopause   . Pancreatitis   . Insomnia   . Hyperlipidemia   . Sinusitis   . Pernicious anemia   . Subdural hematoma   . Macrocytic anemia   . Hepatomegaly   . 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 Seizures     1.5 YRS  LAST ONE  . Depression   . Fx humeral neck 04/17/2011    Transverse  fracture- minimally displaced- managed as outpatient   . ABNORMAL PAP SMEAR, LGSIL 07/23/2008    Annotation: HPV positive CIN I Dr. Mancel Bale, [facility] Qualifier: Diagnosis of  By: Oretha Ellis    . Pneumonia 05/20/2012  . Hepatitis     HEPATOMEGALY     Past Surgical History  Procedure Laterality Date  . Cesarean section  1983  . Esophagogastroduodenoscopy  07/11/2011    Procedure: ESOPHAGOGASTRODUODENOSCOPY (EGD);  Surgeon: Beryle Beams, MD;  Location: [facility];  Service: Endoscopy;  Laterality: N/A;  . Colonoscopy  07/11/2011    Procedure: COLONOSCOPY;  Surgeon: Beryle Beams, MD;  Location: [facility];  Service: Endoscopy;  Laterality: N/A;  . Eye surgery      LEFT EYE YRS AGO   . Rt colectomy  08/28/2011  . Esophagogastroduodenoscopy N/A 12/01/2012    Procedure: ESOPHAGOGASTRODUODENOSCOPY (EGD);  Surgeon: Irene Shipper, MD;  Location: [facility];  Service: Endoscopy;  Laterality: N/A;    Inda Coke MS, RD, LDN Inpatient Registered Dietitian Pager: [phone] After-hours pager: [phone]

## 2013-08-15 NOTE — Progress Notes (Signed)
CRITICAL VALUE ALERT  Critical value received:  Ca 6.4  Date of notification:  08/15/13 Time of notification:  2012  Critical value read back:yes  Nurse who received alert:  Parthenia Ames   MD notified (1st page):  Wilson  Time of first page:  2013  MD notified (2nd page):  Time of second page:  Responding MD:  Redmond Pulling  Time MD responded:  2017

## 2013-08-15 NOTE — ED Notes (Signed)
Pt states she went for a check up yesterday at her PCP. Her doctor called her today and told her to come to ED for low magnesium levels on her labs drawn yesterday in the office. She denies any complaints. She looks jaundiced

## 2013-08-16 ENCOUNTER — Inpatient Hospital Stay (HOSPITAL_COMMUNITY): Payer: PRIVATE HEALTH INSURANCE

## 2013-08-16 DIAGNOSIS — D649 Anemia, unspecified: Secondary | ICD-10-CM

## 2013-08-16 DIAGNOSIS — K219 Gastro-esophageal reflux disease without esophagitis: Secondary | ICD-10-CM

## 2013-08-16 DIAGNOSIS — J189 Pneumonia, unspecified organism: Secondary | ICD-10-CM | POA: Diagnosis present

## 2013-08-16 DIAGNOSIS — A419 Sepsis, unspecified organism: Secondary | ICD-10-CM | POA: Diagnosis present

## 2013-08-16 DIAGNOSIS — E872 Acidosis, unspecified: Secondary | ICD-10-CM

## 2013-08-16 DIAGNOSIS — F341 Dysthymic disorder: Secondary | ICD-10-CM

## 2013-08-16 DIAGNOSIS — K861 Other chronic pancreatitis: Secondary | ICD-10-CM

## 2013-08-16 DIAGNOSIS — I129 Hypertensive chronic kidney disease with stage 1 through stage 4 chronic kidney disease, or unspecified chronic kidney disease: Secondary | ICD-10-CM

## 2013-08-16 DIAGNOSIS — R000 Tachycardia, unspecified: Secondary | ICD-10-CM

## 2013-08-16 LAB — BLOOD GAS, ARTERIAL
Acid-base deficit: 9.9 mmol/L — ABNORMAL HIGH (ref 0.0–2.0)
Bicarbonate: 14.9 mEq/L — ABNORMAL LOW (ref 20.0–24.0)
DRAWN BY: 282831
FIO2: 0.21 %
O2 Saturation: 93.2 %
PATIENT TEMPERATURE: 99.1
PCO2 ART: 29.5 mmHg — AB (ref 35.0–45.0)
PH ART: 7.324 — AB (ref 7.350–7.450)
TCO2: 15.7 mmol/L (ref 0–100)
pO2, Arterial: 73.8 mmHg — ABNORMAL LOW (ref 80.0–100.0)

## 2013-08-16 LAB — COMPREHENSIVE METABOLIC PANEL
ALK PHOS: 487 U/L — AB (ref 39–117)
ALT: 31 U/L (ref 0–35)
AST: 183 U/L — ABNORMAL HIGH (ref 0–37)
Albumin: 1.9 g/dL — ABNORMAL LOW (ref 3.5–5.2)
BUN: 16 mg/dL (ref 6–23)
CO2: 15 meq/L — AB (ref 19–32)
Calcium: 7.1 mg/dL — ABNORMAL LOW (ref 8.4–10.5)
Chloride: 104 mEq/L (ref 96–112)
Creatinine, Ser: 1.13 mg/dL — ABNORMAL HIGH (ref 0.50–1.10)
GFR, EST AFRICAN AMERICAN: 62 mL/min — AB (ref >90)
GFR, EST NON AFRICAN AMERICAN: 53 mL/min — AB (ref >90)
GLUCOSE: 113 mg/dL — AB (ref 70–99)
Potassium: 4.1 mEq/L (ref 3.7–5.3)
SODIUM: 136 meq/L — AB (ref 137–147)
TOTAL PROTEIN: 7.2 g/dL (ref 6.0–8.3)
Total Bilirubin: 0.7 mg/dL (ref 0.3–1.2)

## 2013-08-16 LAB — BASIC METABOLIC PANEL
BUN: 14 mg/dL (ref 6–23)
CO2: 15 mEq/L — ABNORMAL LOW (ref 19–32)
Calcium: 7.4 mg/dL — ABNORMAL LOW (ref 8.4–10.5)
Chloride: 100 mEq/L (ref 96–112)
Creatinine, Ser: 1.3 mg/dL — ABNORMAL HIGH (ref 0.50–1.10)
GFR, EST AFRICAN AMERICAN: 52 mL/min — AB (ref >90)
GFR, EST NON AFRICAN AMERICAN: 45 mL/min — AB (ref >90)
Glucose, Bld: 220 mg/dL — ABNORMAL HIGH (ref 70–99)
Potassium: 4.6 mEq/L (ref 3.7–5.3)
SODIUM: 131 meq/L — AB (ref 137–147)

## 2013-08-16 LAB — URINALYSIS, ROUTINE W REFLEX MICROSCOPIC
Bilirubin Urine: NEGATIVE
GLUCOSE, UA: NEGATIVE mg/dL
HGB URINE DIPSTICK: NEGATIVE
Ketones, ur: NEGATIVE mg/dL
Leukocytes, UA: NEGATIVE
Nitrite: NEGATIVE
Protein, ur: 100 mg/dL — AB
SPECIFIC GRAVITY, URINE: 1.013 (ref 1.005–1.030)
Urobilinogen, UA: 0.2 mg/dL (ref 0.0–1.0)
pH: 5.5 (ref 5.0–8.0)

## 2013-08-16 LAB — URINE MICROSCOPIC-ADD ON

## 2013-08-16 LAB — CBC
HEMATOCRIT: 21.7 % — AB (ref 36.0–46.0)
Hemoglobin: 7.7 g/dL — ABNORMAL LOW (ref 12.0–15.0)
MCH: 34.1 pg — AB (ref 26.0–34.0)
MCHC: 35.5 g/dL (ref 30.0–36.0)
MCV: 96 fL (ref 78.0–100.0)
Platelets: 138 10*3/uL — ABNORMAL LOW (ref 150–400)
RBC: 2.26 MIL/uL — ABNORMAL LOW (ref 3.87–5.11)
RDW: 13.2 % (ref 11.5–15.5)
WBC: 7.2 10*3/uL (ref 4.0–10.5)

## 2013-08-16 LAB — MAGNESIUM
MAGNESIUM: 2.2 mg/dL (ref 1.5–2.5)
MAGNESIUM: 1.4 mg/dL — AB (ref 1.5–2.5)

## 2013-08-16 LAB — RAPID URINE DRUG SCREEN, HOSP PERFORMED
Amphetamines: NOT DETECTED
Barbiturates: NOT DETECTED
Benzodiazepines: NOT DETECTED
Cocaine: NOT DETECTED
Opiates: NOT DETECTED
Tetrahydrocannabinol: NOT DETECTED

## 2013-08-16 LAB — SALICYLATE LEVEL

## 2013-08-16 LAB — PHOSPHORUS: PHOSPHORUS: 3.9 mg/dL (ref 2.3–4.6)

## 2013-08-16 LAB — LACTIC ACID, PLASMA: LACTIC ACID, VENOUS: 3.3 mmol/L — AB (ref 0.5–2.2)

## 2013-08-16 MED ORDER — VANCOMYCIN HCL 500 MG IV SOLR
500.0000 mg | INTRAVENOUS | Status: DC
  Administered 2013-08-16 – 2013-08-21 (×6): 500 mg via INTRAVENOUS
  Filled 2013-08-16 (×7): qty 500

## 2013-08-16 MED ORDER — PIPERACILLIN-TAZOBACTAM 3.375 G IVPB
3.3750 g | Freq: Three times a day (TID) | INTRAVENOUS | Status: DC
  Administered 2013-08-16 – 2013-08-19 (×9): 3.375 g via INTRAVENOUS
  Filled 2013-08-16 (×13): qty 50

## 2013-08-16 MED ORDER — MAGNESIUM OXIDE 400 MG PO TABS: 400.0000 mg | ORAL_TABLET | Freq: Four times a day (QID) | ORAL | Status: DC

## 2013-08-16 MED ORDER — ACETAMINOPHEN 325 MG PO TABS
325.0000 mg | ORAL_TABLET | Freq: Four times a day (QID) | ORAL | Status: DC | PRN
  Administered 2013-08-16 – 2013-08-19 (×5): 325 mg via ORAL
  Filled 2013-08-16 (×6): qty 1

## 2013-08-16 MED ORDER — SODIUM CHLORIDE 0.9 % IV SOLN
INTRAVENOUS | Status: AC
  Administered 2013-08-17: 1000 mL via INTRAVENOUS

## 2013-08-16 MED ORDER — CEPHALEXIN 500 MG PO CAPS
500.0000 mg | ORAL_CAPSULE | Freq: Two times a day (BID) | ORAL | Status: DC
  Filled 2013-08-16 (×2): qty 1

## 2013-08-16 MED ORDER — MAGNESIUM OXIDE 400 (241.3 MG) MG PO TABS
400.0000 mg | ORAL_TABLET | Freq: Four times a day (QID) | ORAL | Status: DC
  Administered 2013-08-16 – 2013-08-22 (×22): 400 mg via ORAL
  Filled 2013-08-16 (×27): qty 1

## 2013-08-16 MED ORDER — SODIUM CHLORIDE 0.9 % IV SOLN: INTRAVENOUS | Status: DC

## 2013-08-16 NOTE — Progress Notes (Addendum)
Subjective: Patient seen and examined at the bedside this morning. She says she feels fine. Denies cough, SOB, palpitations, dysuria. She has perhaps had increased urinary frequency recently.  She had a fever overnight to 101.6. This recurred in the morning to 101.7.  Objective: Vital signs in last 24 hours: Filed Vitals:   08/16/13 0129 08/16/13 0556 08/16/13 0916 08/16/13 1150  BP:  108/76  125/87  Pulse:  101  127  Temp: 99.8 F (37.7 C) 98.1 F (36.7 C) 99.1 F (37.3 C) 101.7 F (38.7 C)  TempSrc: Oral Oral  Oral  Resp:  20  18  Height:      Weight:      SpO2:  93% 95% 95%   Weight change:   Intake/Output Summary (Last 24 hours) at 08/16/13 1202 Last data filed at 08/16/13 0600  Gross per 24 hour  Intake    100 ml  Output    250 ml  Net   -150 ml    Physical Exam  Constitutional: She is oriented to person, place, and time. She appears malnourished. No distress.  HENT:  Head: Normocephalic and atraumatic.  Eyes: Conjunctivae and EOM are normal. Pupils are equal, round, and reactive to light.  Neck: Normal range of motion. Neck supple.  Cardiovascular: Regular rhythm. Tachycardia present. Exam reveals no gallop and no friction rub.  No murmur heard.  Pulmonary/Chest: Effort normal. No respiratory distress. She has no wheezes. She has rales (Velco-like, high pitched inspiratory breath sounds noted in right lower lung field). She exhibits no tenderness.  Abdominal: Soft. Bowel sounds are normal. She exhibits no distension. There is no tenderness.  Musculoskeletal: Normal range of motion. She exhibits no edema and no tenderness.  Neurological: She is alert and oriented to person, place, and time. No cranial nerve deficit. GCS score is 15. No tremors with outstretched hands. Skin: Skin is warm and dry.  Psychiatric: She is apathetic. She has a flat affect.   Lab Results: Basic Metabolic Panel:  Recent Labs Lab 08/15/13 1038 08/15/13 1909 08/16/13 0223  NA 136*  136* 136*  K 4.2 4.2 4.1  CL 104 103 104  CO2 14* 13* 15*  GLUCOSE 110* 115* 113*  BUN 20 17 16   CREATININE 1.08 1.05 1.13*  CALCIUM 6.2* 6.4* 7.1*  MG 0.8* 1.6 2.2  PHOS 3.5  --  3.9   Liver Function Tests:  Recent Labs Lab 08/15/13 1038 08/16/13 0223  AST 157* 183*  ALT 27 31  ALKPHOS 475* 487*  BILITOT 0.4 0.7  PROT 7.2 7.2  ALBUMIN 1.8* 1.9*    Recent Labs Lab 08/15/13 1038  LIPASE 8*   No results found for this basename: AMMONIA,  in the last 168 hours CBC:  Recent Labs Lab 08/15/13 1038 08/16/13 0223  WBC 6.3 7.2  NEUTROABS 3.7  --   HGB 8.0* 7.7*  HCT 23.3* 21.7*  MCV 98.7 96.0  PLT 148* 138*   Cardiac Enzymes: No results found for this basename: CKTOTAL, CKMB, CKMBINDEX, TROPONINI,  in the last 168 hours BNP: No results found for this basename: PROBNP,  in the last 168 hours D-Dimer: No results found for this basename: DDIMER,  in the last 168 hours CBG: No results found for this basename: GLUCAP,  in the last 168 hours Hemoglobin A1C: No results found for this basename: HGBA1C,  in the last 168 hours Fasting Lipid Panel: No results found for this basename: CHOL, HDL, LDLCALC, TRIG, CHOLHDL, LDLDIRECT,  in the last  168 hours Thyroid Function Tests: No results found for this basename: TSH, T4TOTAL, FREET4, T3FREE, THYROIDAB,  in the last 168 hours Coagulation: No results found for this basename: LABPROT, INR,  in the last 168 hours Anemia Panel: No results found for this basename: VITAMINB12, FOLATE, FERRITIN, TIBC, IRON, RETICCTPCT,  in the last 168 hours Urine Drug Screen: Drugs of Abuse     Component Value Date/Time   LABOPIA NONE DETECTED 08/16/2013 0041   LABOPIA NEG 09/18/2011 0936   COCAINSCRNUR NONE DETECTED 08/16/2013 0041   COCAINSCRNUR NEG 09/18/2011 0936   LABBENZ NONE DETECTED 08/16/2013 0041   LABBENZ NEG 09/18/2011 0936   LABBENZ NEG 04/10/2011 1130   AMPHETMU NONE DETECTED 08/16/2013 0041   AMPHETMU NEG 04/10/2011 1130   THCU  NONE DETECTED 08/16/2013 0041   LABBARB NONE DETECTED 08/16/2013 0041   LABBARB NEG 09/18/2011 0936    Alcohol Level:  Recent Labs Lab 08/14/13 0958 08/15/13 1038  ETH 244* 169*   Urinalysis:  Recent Labs Lab 08/16/13 0041  COLORURINE YELLOW  LABSPEC 1.013  PHURINE 5.5  GLUCOSEU NEGATIVE  HGBUR NEGATIVE  BILIRUBINUR NEGATIVE  KETONESUR NEGATIVE  PROTEINUR 100*  UROBILINOGEN 0.2  NITRITE NEGATIVE  LEUKOCYTESUR NEGATIVE    Micro Results: No results found for this or any previous visit (from the past 240 hour(s)). Studies/Results: Dg Chest 2 View  08/15/2013   CLINICAL DATA:  Abnormal right lower chest breath sounds, evaluate for pneumonia  EXAM: CHEST  2 VIEW  COMPARISON:  12/06/2012  FINDINGS: Normal heart size. Increased mild basilar interstitial opacities, more pronounced in the right lower lobe. This can be seen with early edema. Stable heart size. No large effusion or pneumothorax. Trachea midline. Deformities of the humeral necks bilaterally from prior fractures. Trachea is midline.  IMPRESSION: Asymmetric bibasilar interstitial opacities concerning for minimal basilar edema.   Electronically Signed   By: Daryll Brod M.D.   On: 08/15/2013 13:47   Medications: I have reviewed the patient's current medications. Scheduled Meds: . amLODipine  5 mg Oral Daily  . aspirin EC  81 mg Oral QPM  . calcium carbonate  6 tablet Oral TID  . feeding supplement (ENSURE COMPLETE)  237 mL Oral BID BM  . ferrous sulfate  325 mg Oral BID WC  . FLUoxetine  10 mg Oral Daily  . folic acid  1 mg Oral Daily  . furosemide  20 mg Oral Daily  . gabapentin  600 mg Oral TID  . heparin  5,000 Units Subcutaneous 3 times per day  . lipase/protease/amylase  1 capsule Oral TID AC  . LORazepam  0-4 mg Intravenous Q6H   Followed by  . [START ON 08/17/2013] LORazepam  0-4 mg Intravenous Q12H  . magnesium oxide  400 mg Oral QID  . multivitamin with minerals  1 tablet Oral Daily  . pantoprazole  40 mg  Oral Daily  . sodium chloride  3 mL Intravenous Q12H  . sodium chloride  3 mL Intravenous Q12H  . thiamine  100 mg Oral Daily   Or  . thiamine  100 mg Intravenous Daily  . vitamin B-12  250 mcg Oral QPM   Continuous Infusions:  PRN Meds:.sodium chloride, acetaminophen, LORazepam, LORazepam, promethazine, promethazine, sodium chloride Assessment/Plan: [name] [name] is a 58 year old female with a history of EtOH abuse, chronic pancreatitis, protein calorie malnutrition, hypocalcemia and hypomagnesemia, HTN, CKD3 who presents to the ED because she was instructed to do so by the [facility] for low magnesium.   #  Severe sepsis - Patient meets criteria for severe sepsis with fever to 101.7, tachycardia 127, elevated lactate to 3.3. Though she appears in no acute distress, she endorses polyuria with a dirty UA and there are crackles in her right lung bases, giving [place] 2 possible sources. She may also be hypovolemic from low po intake. Lactate can be elevated in the setting of liver disease, but it has been normal in the past and we cannot rule out infection given other clinical findings. - IVF NS @50  cc/hr - Vanc and zosyn per pharmacy, to cover for MRSA and anaerobes given risk of aspiration - Repeat lactate in am  #Abnormal breath sounds in right lower lung field - Asymptomatic. CXR appearance was consistent with interstitial process. High resolution CT chest was performed for better evaluation of interstitial lung disease. Spoke with radiologist and appearance is consistent with multifocal pneumonia vs. Pneumonitis. - Treatment as above with Vanc and Zosyn  #Hypomagnesemia - Resolved, status post magnesium sulfate 4 g IV. No issues on telemetry. Phosphorus and potassium are also within normal limits. - Continue home mag oxide 400mg  QID - Continue to monitor BMP, mag BID  Magnesium  Date Value Ref Range Status  08/16/2013 2.2  1.5 - 2.5 mg/dL Final  08/15/2013 1.6  1.5 - 2.5 mg/dL Final  08/15/2013  0.8* 1.5 - 2.5 mg/dL Final     CRITICAL RESULT CALLED TO, READ BACK BY AND VERIFIED WITH:     [facility] AT 1137 08/15/13 BY K BARR    #Hypocalcemia in the setting of hypoalbuminemia/polyclonal gammopathy, resolved - Corrected calcium is 8.8 today. - Continue home calcium carbonate 500mg  6 tabs (1200 mg elemental calcium) 3 times a day   Calcium  Date Value Ref Range Status  08/16/2013 7.1* 8.4 - 10.5 mg/dL Final  08/15/2013 6.4* 8.4 - 10.5 mg/dL Final     CRITICAL RESULT CALLED TO, READ BACK BY AND VERIFIED WITH:     N FAUCETTE,RN 08/15/13 2012 [name] M  08/15/2013 6.2* 8.4 - 10.5 mg/dL Final     CRITICAL RESULT CALLED TO, READ BACK BY AND VERIFIED WITH:     [place] AT 1137 08/15/13 BY K BARR    #Alcohol abuse - CIWA 1-4 overnight for sweating, an episode of nausea. UDS negative. No tremors currently but tachycardic, low-grade fever. - CIWA protocol  - Folate, thiamine, multivitamin  #Severe protein calorie malnutrition - Albumin 1.9 this morning. She admits to most of her daily po intake being "water and wine."  - Appreciate nutrition recs - Regular, liberalized  diet  - Ensure feeding supplements twice a day between meals  - Will refer patient to outpatient Internal Medicine RD  #Normocytic anemia - Baseline hemoglobin 8.8-10.7. She has a history of B12 deficiency and folate deficiency causing macrocytic anemia. She is also on iron supplementation for a history of iron deficiency anemia. She may have a mixed process here causing normocytic anemia. She is not compliant with her medicines. FOBT negative in the ED.  - Continue home vitamin B12 250 mcg every evening  - Continue home ferrous sulfate 325 mg twice a day with meals  - Folate, thiamine, multivitamin  - Daily CBC  Hemoglobin  Date Value Ref Range Status  08/16/2013 7.7* 12.0 - 15.0 g/dL Final  08/15/2013 8.0* 12.0 - 15.0 g/dL Final  06/10/2013 10.1* 12.0 - 15.0 g/dL Final  05/21/2013 9.6* 12.0 - 15.0 g/dL Final    01/15/2013 10.7* 12.0 - 15.0 g/dL Final  12/09/2012 8.8*  12.0 - 15.0 g/dL Final  12/08/2012 8.9* 12.0 - 15.0 g/dL Final  12/07/2012 8.0* 12.0 - 15.0 g/dL Final  12/06/2012 8.3* 12.0 - 15.0 g/dL Final     POST TRANSFUSION SPECIMEN     DELTA CHECK NOTED  12/06/2012 6.0* 12.0 - 15.0 g/dL Final     REPEATED TO VERIFY     CRITICAL RESULT CALLED TO, READ BACK BY AND VERIFIED WITH:     STERN,RONNIE RN @0540  ON 12/06/2012 BY MCREYNOLDS,B    #CKD3 - Baseline Cr 1.2-1.3 Currently 1.08.  - Continue to monitor  #Chronic anion gap metabolic acidosis - AG 17, stable. ABG shows primary metabolic acidosis, with increased anion gap,with full respiratory compensation, likely 2/2 lactic acidosis. UA negative for ketones. - Treating sepsis as above  ABG    Component Value Date/Time   PHART 7.324* 08/16/2013 0940   PCO2ART 29.5* 08/16/2013 0940   PO2ART 73.8* 08/16/2013 0940   HCO3 14.9* 08/16/2013 0940   TCO2 15.7 08/16/2013 0940   ACIDBASEDEF 9.9* 08/16/2013 0940   O2SAT 93.2 08/16/2013 0940    #Chronic pancreatitis - Stable. Patient denies abdominal pain, nausea, vomiting, diarrhea.  - Continue home Creon   #Depression - Apathy and blunted affect noted on exam.  - Continue home Prozac 10 mg daily   #HTN - Currently normotensive.  - Continue home amlodipine 5 mg daily  - Continue home aspirin 81 mg daily  - Stopping home Lasix 20 mg daily given concern for hypovolemia  #Tachycardia - A chronic issue per chart review, but now with fever. Normotensive.  - Continue to monitor on telemetry   #GERD - Continue home Protonix 40 mg daily   #Neuropathic pain/anxiety - Continue home gabapentin 600 mg 3 times a day   #DVT PPX - subcutaneous heparin   Dispo: Disposition is deferred at this time, awaiting improvement of current medical problems.  Anticipated discharge in approximately 1-3 day(s).   The patient does have a current PCP Otho Bellows, MD) and does need an [facility] hospital follow-up appointment  after discharge.  The patient does not have transportation limitations that hinder transportation to clinic appointments.  .Services Needed at time of discharge: Y = Yes, Blank = No PT:   OT:   RN:   Equipment:   Other:     LOS: 1 day   Lesly Dukes, MD 08/16/2013, 12:02 PM

## 2013-08-16 NOTE — Progress Notes (Signed)
ANTIBIOTIC CONSULT NOTE - INITIAL  Pharmacy Consult for Vancomycin and Zosyn Indication: Sepsis and aspiration PNA  Allergies  Allergen Reactions  . Amitriptyline Hcl Swelling    In the face.  . Doxycycline Hyclate Itching    Feels like something crawling under her skin    Patient Measurements: Height: 5\' 1"  (154.9 cm) Weight: 82 lb 10.8 oz (37.5 kg) IBW/kg (Calculated) : 47.8   Vital Signs: Temp: 101.7 F (38.7 C) (03/14 1150) Temp src: Oral (03/14 1150) BP: 125/87 mmHg (03/14 1150) Pulse Rate: 127 (03/14 1150) Intake/Output from previous day: 03/13 0701 - 03/14 0700 In: 100 [P.O.:100] Out: 250 [Urine:250] Intake/Output from this shift:    Labs:  Recent Labs  08/15/13 1038 08/15/13 1909 08/16/13 0223  WBC 6.3  --  7.2  HGB 8.0*  --  7.7*  PLT 148*  --  138*  CREATININE 1.08 1.05 1.13*   Estimated Creatinine Clearance: 32.9 ml/min (by C-G formula based on Cr of 1.13). No results found for this basename: VANCOTROUGH, VANCOPEAK, VANCORANDOM, GENTTROUGH, GENTPEAK, GENTRANDOM, TOBRATROUGH, TOBRAPEAK, TOBRARND, AMIKACINPEAK, AMIKACINTROU, AMIKACIN,  in the last 72 hours   Microbiology: No results found for this or any previous visit (from the past 720 hour(s)).  Medical History: Past Medical History  Diagnosis Date  . Anemia, B12 deficiency   . History of acute pancreatitis   . Right knee pain     No recent imaging on chart  . 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 Dr. Mancel Bale, [facility]  . Hypertriglyceridemia   . GERD (gastroesophageal reflux disease)   . Vitamin D deficiency   . Subdural hematoma 02/2008    Likely 2/2 trauma from seizure from EtOH withdrawal, chronic in nature, sees Dr. Jerene Bears. Most recent CT head 10/2009 showing stable but persistent hematoma without mass effect.  . History of seizure disorder     Likely 2/2 alcohol abuse  . Hypocalcemia   . Hypomagnesemia   .  Failure to thrive in childhood     Unclear etiology  . HTN (hypertension)   . Thrombocytopenia   . Anemia, macrocytic   . Hepatomegaly     On exam  . Joint pain   . Alcohol abuse   . Vitamin D deficiency   . Menopause   . Pancreatitis   . Insomnia   . Hyperlipidemia   . Sinusitis   . Pernicious anemia   . Macrocytic anemia   . 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 Depression   . Fx humeral neck 04/17/2011    Transverse fracture- minimally displaced- managed as outpatient   . ABNORMAL PAP SMEAR, LGSIL 07/23/2008    Annotation: HPV positive CIN I Dr. Mancel Bale, [facility] Qualifier: Diagnosis of  By: Oretha Ellis    . Pneumonia 05/20/2012  . Seizures     "last one was in 2013; don't know what kind" (08/15/2013)  . Arthritis     "shoulders" (08/15/2013)    Medications:  Prescriptions prior to admission  Medication Sig Dispense Refill  . amLODipine (NORVASC) 10 MG tablet Take 0.5 tablets (5 mg total) by mouth daily. For hypertension  90 tablet  4  . aspirin EC 81 MG tablet Take 1 tablet (81 mg total) by mouth every evening.  90 tablet  3  . calcium carbonate (TUMS) 500 MG chewable tablet Chew 6 tablets (1,200 mg of elemental calcium total) by mouth 3 (three)  times daily. For bone health  90 tablet  3  . capsaicin (ZOSTRIX) 0.025 % cream Apply topically 2 (two) times daily.  45 g  0  . cetirizine (ZYRTEC) 10 MG tablet Take 1 tablet (10 mg total) by mouth daily.  90 tablet  3  . diclofenac sodium (VOLTAREN) 1 % GEL Apply 2 g topically 2 (two) times daily. To affected area  100 g  0  . ferrous sulfate 325 (65 FE) MG tablet Take 1 tablet (325 mg total) by mouth 2 (two) times daily with a meal.  180 tablet  3  . FLUoxetine (PROZAC) 10 MG capsule Take 1 capsule (10 mg total) by mouth daily. For depression  90 capsule  4  . folic acid (FOLVITE) 1 MG tablet Take 1 tablet (1 mg total) by mouth daily. For folic acid replacement  30 tablet  3  . furosemide (LASIX) 20 MG  tablet Take 1 tablet (20 mg total) by mouth daily.  30 tablet  1  . gabapentin (NEURONTIN) 300 MG capsule Take 2 capsules (600 mg total) by mouth 3 (three) times daily. For anxiety/pain control  540 capsule  1  . lipase/protease/amylase (CREON-12/PANCREASE) 12000 UNITS CPEP capsule Take 1 capsule by mouth 3 (three) times daily before meals.  270 capsule  3  . magnesium oxide (MAG-OX) 400 MG tablet Take 1 tablet (400 mg total) by mouth 4 (four) times daily.  120 tablet  3  . meclizine (ANTIVERT) 50 MG tablet Take 1 tablet (50 mg total) by mouth 3 (three) times daily as needed for dizziness.  30 tablet  0  . Multiple Vitamin (MULTIVITAMIN WITH MINERALS) TABS tablet Take 1 tablet by mouth daily. For vitamin replacement  90 tablet  3  . Nutritional Supplements (ENSURE HIGH PROTEIN PO) Take 1 Can by mouth 3 (three) times daily. chocolate      . omeprazole (PRILOSEC) 40 MG capsule Take 1 capsule (40 mg total) by mouth daily.  90 capsule  4  . potassium chloride SA (KLOR-CON M20) 20 MEQ tablet Take 1 tablet (20 mEq total) by mouth daily. For low potassium  90 tablet  0  . thiamine (VITAMIN B-1) 100 MG tablet Take 1 tablet (100 mg total) by mouth daily. For low thiamine  90 tablet  0  . vitamin B-12 (CYANOCOBALAMIN) 250 MCG tablet Take 1 tablet (250 mcg total) by mouth every evening.  90 tablet  3  . Vitamin D, Ergocalciferol, (DRISDOL) 50000 UNITS CAPS capsule Take 1 capsule (50,000 Units total) by mouth every 7 (seven) days. Thursdays: For bone health  4 capsule  3  . Disposable Gloves (BD VINYL GLOVES LARGE/X-LARGE) MISC 1 Container by Does not apply route once.  50 each  0   Scheduled:  . amLODipine  5 mg Oral Daily  . aspirin EC  81 mg Oral QPM  . calcium carbonate  6 tablet Oral TID  . feeding supplement (ENSURE COMPLETE)  237 mL Oral BID BM  . ferrous sulfate  325 mg Oral BID WC  . FLUoxetine  10 mg Oral Daily  . folic acid  1 mg Oral Daily  . furosemide  20 mg Oral Daily  . gabapentin  600 mg  Oral TID  . heparin  5,000 Units Subcutaneous 3 times per day  . lipase/protease/amylase  1 capsule Oral TID AC  . LORazepam  0-4 mg Intravenous Q6H   Followed by  . [START ON 08/17/2013] LORazepam  0-4 mg Intravenous Q12H  .  magnesium oxide  400 mg Oral QID  . multivitamin with minerals  1 tablet Oral Daily  . pantoprazole  40 mg Oral Daily  . sodium chloride  3 mL Intravenous Q12H  . sodium chloride  3 mL Intravenous Q12H  . thiamine  100 mg Oral Daily   Or  . thiamine  100 mg Intravenous Daily  . vitamin B-12  250 mcg Oral QPM   Assessment: 58 y.o female with a history of EtOH abuse, chronic pancreatitis, protein calorie malnutrition, hypocalcemia and hypomagnesemia, HTN, CKD3.  Patient admitted on 08/14/13 for low magnesium (0.9) to receive IV magnesium replacement.  Today Tc 101.7, Tm 101.7, WBC 7.2K , SCr 1.13 The patient is underweight: Weight 37.5 kg,  IBW 47.8 kg   Goal of Therapy:  Vancomycin trough level 15-20 mcg/ml  Plan:  Zosyn 3.37g IV q8h (infuse each dose over 4 hours) Vancomycin 500 mg IV q24h Monitor daily clinical status, renal function and culture results.  Steady state vancomycin trough when appropriate per protocol.   Nicole Cella, RPh Clinical Pharmacist Pager: [phone]  08/16/2013,12:11 PM

## 2013-08-16 NOTE — ED Provider Notes (Signed)
Medical screening examination/treatment/procedure(s) were performed by non-physician practitioner and as supervising physician I was immediately available for consultation/collaboration.   EKG Interpretation None      Date: 08/15/2013  Rate: 104  Rhythm: sinus tachycardia  QRS Axis: normal  Intervals: normal  ST/T Wave abnormalities: nonspecific ST/T changes  Conduction Disutrbances:none  Narrative Interpretation: possible old anterior infarct  Old EKG Reviewed: unchanged      Neta Ehlers, MD 08/16/13 1514

## 2013-08-16 NOTE — Progress Notes (Signed)
Utilization review completed.  P.J. Allyanna Appleman,RN,BSN Case Manager [phone]  

## 2013-08-16 NOTE — H&P (Signed)
Internal Medicine Attending Admission Note Date: 08/16/2013  Patient name: [name] [name] Medical record number: [id number] Date of birth: September 23, 1955 Age: 58 y.o. Gender: female  I saw and evaluated the patient. I reviewed the resident's note and I agree with the resident's findings and plan as documented in the resident's note, with the following additional comments.  Chief Complaint(s): Low magnesium level  History - key components related to admission: Patient is a 58 year old woman with history of alcohol abuse, chronic pancreatitis, malnutrition, hypocalcemia, hypomagnesemia, hypertension, chronic kidney disease, and other problems as outlined in the medical history, who was advised to come to the emergency room because of a low magnesium level checked in the outpatient clinic.  Patient denies acute problems.  Review of systems is notable for urinary frequency; review of systems is negative for chest pain, shortness of breath, cough, dysuria.   Physical Exam - key components related to admission:  Filed Vitals:   08/15/13 2354 08/16/13 0129 08/16/13 0556 08/16/13 0916  BP: 119/82  108/76   Pulse: 120  101   Temp: 101.6 F (38.7 C) 99.8 F (37.7 C) 98.1 F (36.7 C) 99.1 F (37.3 C)  TempSrc: Oral Oral Oral   Resp: 20  20   Height:      Weight:      SpO2: 93%  93% 95%    General: Alert, oriented, no acute distress Lungs: Prominent "Velcro" crackles in the right lower lung field Heart: Regular; no extra sounds or murmurs Abdomen: Bowel sounds present, soft, nontender Extremities: No edema  Lab results:   Basic Metabolic Panel:  Recent Labs  08/15/13 1038 08/15/13 1909 08/16/13 0223  NA 136* 136* 136*  K 4.2 4.2 4.1  CL 104 103 104  CO2 14* 13* 15*  GLUCOSE 110* 115* 113*  BUN 20 17 16   CREATININE 1.08 1.05 1.13*  CALCIUM 6.2* 6.4* 7.1*  MG 0.8* 1.6 2.2  PHOS 3.5  --  3.9    Liver Function Tests:  Recent Labs  08/15/13 1038 08/16/13 0223  AST 157*  183*  ALT 27 31  ALKPHOS 475* 487*  BILITOT 0.4 0.7  PROT 7.2 7.2  ALBUMIN 1.8* 1.9*    Recent Labs  08/15/13 1038  LIPASE 8*    CBC:  Recent Labs  08/15/13 1038 08/16/13 0223  WBC 6.3 7.2  HGB 8.0* 7.7*  HCT 23.3* 21.7*  MCV 98.7 96.0  PLT 148* 138*    Recent Labs  08/15/13 1038  NEUTROABS 3.7  LYMPHSABS 1.8  MONOABS 0.7  EOSABS 0.1  BASOSABS 0.0    Urine Drug Screen: Drugs of Abuse     Component Value Date/Time   LABOPIA NONE DETECTED 08/16/2013 0041   COCAINSCRNUR NONE DETECTED 08/16/2013 0041   LABBENZ NONE DETECTED 08/16/2013 0041   AMPHETMU NONE DETECTED 08/16/2013 0041   THCU NONE DETECTED 08/16/2013 0041   LABBARB NONE DETECTED 08/16/2013 0041     Alcohol Level:  Recent Labs  08/14/13 0958 08/15/13 1038  ETH 244* 169*    Urinalysis    Component Value Date/Time   COLORURINE YELLOW 08/16/2013 0041   APPEARANCEUR HAZY* 08/16/2013 0041   LABSPEC 1.013 08/16/2013 0041   PHURINE 5.5 08/16/2013 0041   GLUCOSEU NEGATIVE 08/16/2013 0041   HGBUR NEGATIVE 08/16/2013 0041   BILIRUBINUR NEGATIVE 08/16/2013 0041   KETONESUR NEGATIVE 08/16/2013 0041   PROTEINUR 100* 08/16/2013 0041   UROBILINOGEN 0.2 08/16/2013 0041   NITRITE NEGATIVE 08/16/2013 0041   LEUKOCYTESUR NEGATIVE 08/16/2013 0041  Urine microscopic:  Recent Labs  08/16/13 0041  EPIU RARE  WBCU 11-20  RBCU 0-2  BACTERIA MANY*    ABG    Component Value Date/Time   PHART 7.324* 08/16/2013 0940   PCO2ART 29.5* 08/16/2013 0940   PO2ART 73.8* 08/16/2013 0940   HCO3 14.9* 08/16/2013 0940   TCO2 15.7 08/16/2013 0940   ACIDBASEDEF 9.9* 08/16/2013 0940   O2SAT 93.2 08/16/2013 0940   Lactic Acid, Venous  Date Value Ref Range Status  08/16/2013 3.3* 0.5 - 2.2 mmol/L Final     Imaging results:  Dg Chest 2 View  08/15/2013   CLINICAL DATA:  Abnormal right lower chest breath sounds, evaluate for pneumonia  EXAM: CHEST  2 VIEW  COMPARISON:  12/06/2012  FINDINGS: Normal heart size. Increased mild  basilar interstitial opacities, more pronounced in the right lower lobe. This can be seen with early edema. Stable heart size. No large effusion or pneumothorax. Trachea midline. Deformities of the humeral necks bilaterally from prior fractures. Trachea is midline.  IMPRESSION: Asymmetric bibasilar interstitial opacities concerning for minimal basilar edema.   Electronically Signed   By: Daryll Brod M.D.   On: 08/15/2013 13:47    Other results: EKG: Sinus tachycardia; anterior infarct, old; no significant change since last tracing  Assessment & Plan by Problem:   1.  Hypomagnesemia secondary to chronic alcohol abuse.   Patient has no symptoms of hypomagnesemia.  Her magnesium corrected following infusion of IV magnesium sulfate; will need to follow closely to make sure the improvement is not transient.  Plan is continue oral magnesium oxide; follow magnesium level.  2.  Fever.  Possible sources include urinary tract infection; given finding of prominent crackles in the right base on lung exam, pneumonia is possible although patient denies any pulmonary symptoms.  Plan is empiric IV antibiotic therapy pending results of blood and urine cultures; CT scan of the chest to assess "Velcro" rales which are suggestive of interstitial lung disease.  3.  Elevated anion gap metabolic acidosis.  Patient has chronically low bicarbonate suggestive of a chronic acidosis.  The lactic acid is elevated, which may be due to her liver disease; although she is febrile, she is not hypotensive.  Other possible causes of the metabolic acidosis include alcoholic or starvation ketoacidosis.  Plan is empiric IV antibiotics; supportive care; follow electrolytes, liver enzymes, and renal function.    4.  Alcohol abuse.  Plan is to supplement thiamine and folate; CIWA protocol; counsel regarding abstinence/rehabilitation.  5.  Other problems and plans as per the resident physician's note.

## 2013-08-16 NOTE — Progress Notes (Signed)
08/16/13-1743-J.Cashae Weich,RN,BSN       Spoke with Dr. Margart Sickles regarding recommendation to make inpatient status. Agreed and gave verbal order to change status. Orders entered.

## 2013-08-17 DIAGNOSIS — J189 Pneumonia, unspecified organism: Secondary | ICD-10-CM

## 2013-08-17 LAB — BASIC METABOLIC PANEL
BUN: 18 mg/dL (ref 6–23)
BUN: 18 mg/dL (ref 6–23)
CALCIUM: 7.5 mg/dL — AB (ref 8.4–10.5)
CO2: 16 mEq/L — ABNORMAL LOW (ref 19–32)
CO2: 14 mEq/L — ABNORMAL LOW (ref 19–32)
CREATININE: 1.46 mg/dL — AB (ref 0.50–1.10)
Calcium: 7.1 mg/dL — ABNORMAL LOW (ref 8.4–10.5)
Chloride: 103 mEq/L (ref 96–112)
Chloride: 99 mEq/L (ref 96–112)
Creatinine, Ser: 1.4 mg/dL — ABNORMAL HIGH (ref 0.50–1.10)
GFR calc Af Amer: 48 mL/min — ABNORMAL LOW (ref >90)
GFR calc non Af Amer: 39 mL/min — ABNORMAL LOW (ref >90)
GFR calc non Af Amer: 41 mL/min — ABNORMAL LOW (ref >90)
GFR, EST AFRICAN AMERICAN: 45 mL/min — AB (ref >90)
Glucose, Bld: 109 mg/dL — ABNORMAL HIGH (ref 70–99)
Glucose, Bld: 289 mg/dL — ABNORMAL HIGH (ref 70–99)
Potassium: 4.3 mEq/L (ref 3.7–5.3)
Potassium: 4.4 mEq/L (ref 3.7–5.3)
Sodium: 133 mEq/L — ABNORMAL LOW (ref 137–147)
Sodium: 130 mEq/L — ABNORMAL LOW (ref 137–147)

## 2013-08-17 LAB — CBC
HCT: 20.3 % — ABNORMAL LOW (ref 36.0–46.0)
HCT: 21.2 % — ABNORMAL LOW (ref 36.0–46.0)
Hemoglobin: 6.9 g/dL — CL (ref 12.0–15.0)
Hemoglobin: 7.3 g/dL — ABNORMAL LOW (ref 12.0–15.0)
MCH: 33.5 pg (ref 26.0–34.0)
MCH: 34.3 pg — ABNORMAL HIGH (ref 26.0–34.0)
MCHC: 34 g/dL (ref 30.0–36.0)
MCHC: 34.4 g/dL (ref 30.0–36.0)
MCV: 98.5 fL (ref 78.0–100.0)
MCV: 99.5 fL (ref 78.0–100.0)
PLATELETS: 124 10*3/uL — AB (ref 150–400)
Platelets: 114 10*3/uL — ABNORMAL LOW (ref 150–400)
RBC: 2.06 MIL/uL — ABNORMAL LOW (ref 3.87–5.11)
RBC: 2.13 MIL/uL — ABNORMAL LOW (ref 3.87–5.11)
RDW: 13.3 % (ref 11.5–15.5)
RDW: 13.5 % (ref 11.5–15.5)
WBC: 7.2 10*3/uL (ref 4.0–10.5)
WBC: 7.1 10*3/uL (ref 4.0–10.5)

## 2013-08-17 LAB — MAGNESIUM
MAGNESIUM: 1.6 mg/dL (ref 1.5–2.5)
Magnesium: 1.3 mg/dL — ABNORMAL LOW (ref 1.5–2.5)

## 2013-08-17 LAB — PHOSPHORUS: PHOSPHORUS: 1.6 mg/dL — AB (ref 2.3–4.6)

## 2013-08-17 MED ORDER — MAGNESIUM SULFATE 40 MG/ML IJ SOLN
2.0000 g | Freq: Once | INTRAMUSCULAR | Status: AC
  Administered 2013-08-17: 2 g via INTRAVENOUS
  Filled 2013-08-17: qty 50

## 2013-08-17 MED ORDER — SODIUM CHLORIDE 0.9 % IV BOLUS (SEPSIS)
250.0000 mL | Freq: Once | INTRAVENOUS | Status: AC
  Administered 2013-08-17: 250 mL via INTRAVENOUS

## 2013-08-17 MED ORDER — ACETAMINOPHEN 325 MG PO TABS
325.0000 mg | ORAL_TABLET | Freq: Once | ORAL | Status: AC
  Administered 2013-08-17: 325 mg via ORAL

## 2013-08-17 NOTE — Progress Notes (Signed)
Dr. Hayes Ludwig was informed about patient's HGB (6.9) and she gave an order to be rechecked patient HGB.

## 2013-08-17 NOTE — Progress Notes (Signed)
CRITICAL VALUE ALERT  Critical value received:  HGB=6.9  Date of notification:  08/17/2013  Time of notification:  12:15  Critical value read back:yes  Nurse who received alert:  Alphonsus Sias  MD notified (1st page):  Dr. Lottie Dawson [phone]  Time of first page:  1217  MD notified (2nd page): Dr. Hayes Ludwig [phone]  Time of second page: 1220  Responding MD:  Dr. Hayes Ludwig  Time MD responded:  [phone]

## 2013-08-17 NOTE — Progress Notes (Signed)
Internal Medicine Attending  Date: 08/17/2013  Patient name: [name] [name] Medical record number: [id number] Date of birth: Jun 14, 1955 Age: 58 y.o. Gender: female  I saw and evaluated the patient. I reviewed the resident's note by Dr. Hayes Ludwig and I agree with the resident's findings and plans as documented in her note.  Dr. Murlean Caller will take over as attending physician tomorrow 08/18/2013.

## 2013-08-17 NOTE — Progress Notes (Signed)
Subjective: She had hallucinations last night, thought she was on the ceiling. Denies hallucinations this morning. Was febrile again last night with Tmax of 102F. Denies chest pain, cough, SOB, heart palpitations, abdominal pain, N/V/D.   Objective: Vital signs in last 24 hours: Filed Vitals:   08/16/13 2238 08/16/13 2346 08/17/13 0041 08/17/13 0501  BP: 93/57 97/61  89/57  Pulse: 131 135  117  Temp: 102 F (38.9 C) 102 F (38.9 C) 98.2 F (36.8 C) 98.2 F (36.8 C)  TempSrc: Oral Oral Oral Oral  Resp: 18 18  18   Height:      Weight:      SpO2: 93% 92%  91%   Weight change:   Intake/Output Summary (Last 24 hours) at 08/17/13 0916 Last data filed at 08/17/13 8341  Gross per 24 hour  Intake   1590 ml  Output      0 ml  Net   1590 ml   Vitals reviewed. General: resting in bed, in NAD, appears malnourished HEENT: no scleral icterus, MMM Cardiac: tachycardia, no rubs, murmurs or gallops Pulm: bibasilar rales with R greater than L, no rhonchi or wheezing Abd: soft, nontender, nondistended, BS present Ext: warm and well perfused, no pedal edema Neuro: alert and oriented X3, moves all extremities voluntarily, no tremors or asterixis.   Lab Results: Basic Metabolic Panel:  Recent Labs Lab 08/16/13 0223 08/16/13 1828 08/17/13 0550  NA 136* 131* 133*  K 4.1 4.6 4.3  CL 104 100 103  CO2 15* 15* 16*  GLUCOSE 113* 220* 109*  BUN 16 14 18   CREATININE 1.13* 1.30* 1.46*  CALCIUM 7.1* 7.4* 7.5*  MG 2.2 1.4* 1.3*  PHOS 3.9  --  1.6*   Liver Function Tests:  Recent Labs Lab 08/15/13 1038 08/16/13 0223  AST 157* 183*  ALT 27 31  ALKPHOS 475* 487*  BILITOT 0.4 0.7  PROT 7.2 7.2  ALBUMIN 1.8* 1.9*    Recent Labs Lab 08/15/13 1038  LIPASE 8*   CBC:  Recent Labs Lab 08/15/13 1038 08/16/13 0223  WBC 6.3 7.2  NEUTROABS 3.7  --   HGB 8.0* 7.7*  HCT 23.3* 21.7*  MCV 98.7 96.0  PLT 148* 138*   Urine Drug Screen: Drugs of Abuse     Component Value  Date/Time   LABOPIA NONE DETECTED 08/16/2013 0041   LABOPIA NEG 09/18/2011 0936   COCAINSCRNUR NONE DETECTED 08/16/2013 0041   COCAINSCRNUR NEG 09/18/2011 0936   LABBENZ NONE DETECTED 08/16/2013 0041   LABBENZ NEG 09/18/2011 0936   LABBENZ NEG 04/10/2011 1130   AMPHETMU NONE DETECTED 08/16/2013 0041   AMPHETMU NEG 04/10/2011 1130   THCU NONE DETECTED 08/16/2013 0041   LABBARB NONE DETECTED 08/16/2013 0041   LABBARB NEG 09/18/2011 0936    Alcohol Level:  Recent Labs Lab 08/14/13 0958 08/15/13 1038  ETH 244* 169*   Urinalysis:  Recent Labs Lab 08/16/13 0041  COLORURINE YELLOW  LABSPEC 1.013  PHURINE 5.5  GLUCOSEU NEGATIVE  HGBUR NEGATIVE  BILIRUBINUR NEGATIVE  KETONESUR NEGATIVE  PROTEINUR 100*  UROBILINOGEN 0.2  NITRITE NEGATIVE  LEUKOCYTESUR NEGATIVE    Micro Results: No results found for this or any previous visit (from the past 240 hour(s)). Studies/Results: Dg Chest 2 View  08/15/2013   CLINICAL DATA:  Abnormal right lower chest breath sounds, evaluate for pneumonia  EXAM: CHEST  2 VIEW  COMPARISON:  12/06/2012  FINDINGS: Normal heart size. Increased mild basilar interstitial opacities, more pronounced in the right lower lobe.  This can be seen with early edema. Stable heart size. No large effusion or pneumothorax. Trachea midline. Deformities of the humeral necks bilaterally from prior fractures. Trachea is midline.  IMPRESSION: Asymmetric bibasilar interstitial opacities concerning for minimal basilar edema.   Electronically Signed   By: Daryll Brod M.D.   On: 08/15/2013 13:47   Ct Chest High Resolution  08/16/2013   CLINICAL DATA:  Dyspnea, dry rales in the right base and concordant radiographic abnormality. Evaluate for interstitial lung disease.  EXAM: CHEST CT WITHOUT CONTRAST  TECHNIQUE: Multidetector CT imaging of the chest was performed following the standard protocol without intravenous contrast. High resolution imaging of the lungs, as well as inspiratory and  expiratory imaging, was performed.  COMPARISON:  Chest x-ray obtained earlier today 08/15/2013; most recent prior chest x-ray 12/06/2012  FINDINGS: Mediastinum: Unremarkable CT appearance of the thyroid gland. No suspicious mediastinal or hilar adenopathy. No soft tissue mediastinal mass. The thoracic esophagus is unremarkable.  Heart/Vascular: Limited evaluation in the absence of intravenous contrast material. 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 Conventional 3 vessel arch. The heart is within normal limits for size. No pericardial effusion.  Lungs/Pleura: Mild focal ground-glass attenuation opacity identified in the inferior periphery of the right upper lobe, the inferior aspect of the right middle lobe, and throughout the dependent portion of the right, and to a lesser extent the left lower lobes. Within the right lower lobe disease, there is a suggestion of interlobular septal thickening inferiorly as well as a more patchy consolidative changes in a peribronchovascular distribution. Trace emphysematous changes are noted in the upper lobes at the apices. Tiny subpleural calcified granuloma versus calcified subpleural lymph node in the periphery of the right upper lobe. No suspicious pulmonary mass or nodule. No pleural effusion or pneumothorax. The upper lungs are essentially clear. Evaluation of the inhalation and exhalation high-resolution CT images demonstrates no definite subpleural reticulation, architectural distortion or evidence of honeycombing to suggest interstitial lung disease.  Bones/Soft Tissues: No acute fracture or aggressive appearing lytic or blastic osseous lesion.  Upper Abdomen: Visualized upper abdomen is unremarkable.  IMPRESSION: 1. Predominantly dependent distribution of ground-glass attenuation airspace disease primarily affecting of the right lower lobe, and to a lesser extent the left lower lobe and inferior aspects of the right upper and middle lobes.  Within the right lower lobe there are areas of superimposed patchy consolidative changes in a peribronchovascular distribution. Overall, the findings are most consistent with a multifocal infectious/inflammatory process including multifocal bronchopneumonia, atypical infection, or pneumonitis such as nonspecific interstitial pneumonitis (NSIP) or even aspiration in the appropriate clinical setting. Dependent interstitial edema with developing asymmetric alveolar edema in the right lower lobe is another possibility but considered less likely. 2. Mild biapical and medial upper lung paraseptal emphysema. 3. Mild sequelae of prior granulomatous disease. 4. Mild ectasia of the ascending thoracic aorta. Findings reviewed with the internal medicine team at the workstation at the time of interpretation. This study will also be reviewed by our dedicated Chest radiologists early next week. Any significant addendum to the above interpretation will be communicated to the admitting team.   Electronically Signed   By: Jacqulynn Cadet M.D.   On: 08/16/2013 13:57   Medications: I have reviewed the patient's current medications. Scheduled Meds: . amLODipine  5 mg Oral Daily  . aspirin EC  81 mg Oral QPM  . calcium carbonate  6 tablet Oral TID  . feeding supplement (ENSURE COMPLETE)  237  mL Oral BID BM  . ferrous sulfate  325 mg Oral BID WC  . FLUoxetine  10 mg Oral Daily  . folic acid  1 mg Oral Daily  . gabapentin  600 mg Oral TID  . heparin  5,000 Units Subcutaneous 3 times per day  . lipase/protease/amylase  1 capsule Oral TID AC  . LORazepam  0-4 mg Intravenous Q6H   Followed by  . LORazepam  0-4 mg Intravenous Q12H  . magnesium oxide  400 mg Oral QID  . multivitamin with minerals  1 tablet Oral Daily  . pantoprazole  40 mg Oral Daily  . piperacillin-tazobactam (ZOSYN)  IV  3.375 g Intravenous 3 times per day  . sodium chloride  3 mL Intravenous Q12H  . sodium chloride  3 mL Intravenous Q12H  . thiamine   100 mg Oral Daily   Or  . thiamine  100 mg Intravenous Daily  . vancomycin  500 mg Intravenous Q24H  . vitamin B-12  250 mcg Oral QPM   Continuous Infusions: . sodium chloride 1,000 mL (08/17/13 0816)   PRN Meds:.sodium chloride, acetaminophen, LORazepam, LORazepam, promethazine, promethazine, sodium chloride Assessment/Plan: 58 year old female with a history of EtOH abuse, chronic pancreatitis, protein calorie malnutrition, hypocalcemia and hypomagnesemia, HTN, CKD3 who presents to the ED because she was instructed to do so by the [facility] for low magnesium.   #Severe sepsis - She has fever to 101.7, tachycardia 127, elevated lactate to 3.3. Sources could be UTI and PNA. - IVF NS @50  cc/hr, encouraged oral hydration - Vanc and zosyn per pharmacy, to cover for MRSA and anaerobes given risk of aspiration   Pneumonia : CAP v aspiration. She was afebrile on presentation with no cough or leucocytosis but she had rales over her RLL on physical exam. Her CXR appearance was consistent with interstitial process but a high resolution CT chest was performed for better evaluation of interstitial lung disease. Her CT chest has been done with no formal read but we have spoken to the in house radiologist who describes her radiologic lung findings consistent with a multifocal pneumonia worse in the RLL v pneumonitis. The patient states that sometimes she "swallowed wrong" at home but denies dysphagia or chocking.  - Treatment as above with Vanc and Zosyn   #Hypomagnesemia - Persistent. Mg of 0.8 on presentation, improved with 4g of IV magnesium sulfate but is down to 1.3 this morning.  Phosphorus slightly low at 1.6.  and potassium are also within normal limits.  - Continue home mag oxide 400mg  QID  - Continue to monitor BMP, mag BID  - Magnesium sulfate 2g infusion  #Hypocalcemia in the setting of hypoalbuminemia/polyclonal gammopathy, resolved - Corrected calcium is 8.7 today.  - Continue home calcium  carbonate 500mg  6 tabs (1200 mg elemental calcium) 3 times a day   #Alcohol abuse - CIWA 2-6 overnight for sweating and visual disturbance. UDS negative. No tremors currently but tachycardic but with sepsis.   - CIWA protocol  - Folate, thiamine, multivitamin   #Severe protein calorie malnutrition - Albumin 1.9 on presentation. She admits to most of her daily po intake being "water and wine."  - Appreciate nutrition recs  - Regular, liberalized diet  - Ensure feeding supplements twice a day between meals  - Will refer patient to outpatient Internal Medicine RD   #Normocytic anemia - Baseline hemoglobin 8.8-10.7. She has a history of B12 deficiency and folate deficiency causing macrocytic anemia. She is also on iron supplementation  for a history of iron deficiency anemia. She may have a mixed process here causing normocytic anemia. She is not compliant with her medicines. FOBT negative in the ED.  - Continue home vitamin B12 250 mcg every evening  - Continue home ferrous sulfate 325 mg twice a day with meals  - Folate, thiamine, multivitamin  - Daily CBC  #CKD3 - Baseline Cr 1.2-1.3 Currently 1.08.  - Continue to monitor   #Chronic anion gap metabolic acidosis - AG 14, stable. ABG shows primary metabolic acidosis, with increased anion gap,with full respiratory compensation, likely 2/2 lactic acidosis. UA negative for ketones.  - Treating sepsis as above  ABG    Component  Value  Date/Time    PHART  7.324*  08/16/2013 0940    PCO2ART  29.5*  08/16/2013 0940    PO2ART  73.8*  08/16/2013 0940    HCO3  14.9*  08/16/2013 0940    TCO2  15.7  08/16/2013 0940    ACIDBASEDEF  9.9*  08/16/2013 0940    O2SAT  93.2  08/16/2013 0940    #Chronic pancreatitis - Stable. Patient denies abdominal pain, nausea, vomiting, diarrhea.  - Continue home Creon   #Depression - Apathy and blunted affect noted on exam.  - Continue home Prozac 10 mg daily   #HTN - Hypotensive this morning with BP of 89/57  - Hold  home amlodipine 5 mg daily  - Continue home aspirin 81 mg daily  - Stopping home Lasix 20 mg daily given concern for hypovolemia   #Tachycardia - A chronic issue per chart review, but now with fever. Normotensive.  - Continue to monitor on telemetry   #GERD - Continue home Protonix 40 mg daily   #Neuropathic pain/anxiety - Continue home gabapentin 600 mg 3 times a day   #DVT PPX - subcutaneous heparin  Dispo: Disposition is deferred at this time, awaiting improvement of current medical problems.  Anticipated discharge in approximately 1-2 day(s).   The patient does have a current PCP Otho Bellows, MD) and does need an [facility] hospital follow-up appointment after discharge.  The patient does not have transportation limitations that hinder transportation to clinic appointments.  .Services Needed at time of discharge: Y = Yes, Blank = No PT:   OT:   RN:   Equipment:   Other:     LOS: 2 days   Blain Pais, MD 08/17/2013, 9:16 AM

## 2013-08-18 DIAGNOSIS — R509 Fever, unspecified: Secondary | ICD-10-CM

## 2013-08-18 DIAGNOSIS — N183 Chronic kidney disease, stage 3 unspecified: Secondary | ICD-10-CM

## 2013-08-18 DIAGNOSIS — F1010 Alcohol abuse, uncomplicated: Secondary | ICD-10-CM

## 2013-08-18 LAB — CBC
HCT: 24.4 % — ABNORMAL LOW (ref 36.0–46.0)
HCT: 20.5 % — ABNORMAL LOW (ref 36.0–46.0)
Hemoglobin: 8.1 g/dL — ABNORMAL LOW (ref 12.0–15.0)
Hemoglobin: 7 g/dL — ABNORMAL LOW (ref 12.0–15.0)
MCH: 33.2 pg (ref 26.0–34.0)
MCH: 34.1 pg — ABNORMAL HIGH (ref 26.0–34.0)
MCHC: 33.2 g/dL (ref 30.0–36.0)
MCHC: 34.1 g/dL (ref 30.0–36.0)
MCV: 100 fL (ref 78.0–100.0)
MCV: 100 fL (ref 78.0–100.0)
PLATELETS: 123 10*3/uL — AB (ref 150–400)
Platelets: 124 10*3/uL — ABNORMAL LOW (ref 150–400)
RBC: 2.44 MIL/uL — AB (ref 3.87–5.11)
RBC: 2.05 MIL/uL — ABNORMAL LOW (ref 3.87–5.11)
RDW: 13.6 % (ref 11.5–15.5)
RDW: 13.6 % (ref 11.5–15.5)
WBC: 9 10*3/uL (ref 4.0–10.5)
WBC: 11.2 10*3/uL — ABNORMAL HIGH (ref 4.0–10.5)

## 2013-08-18 LAB — MAGNESIUM
Magnesium: 1.4 mg/dL — ABNORMAL LOW (ref 1.5–2.5)
Magnesium: 2.9 mg/dL — ABNORMAL HIGH (ref 1.5–2.5)

## 2013-08-18 LAB — COMPREHENSIVE METABOLIC PANEL
ALBUMIN: 1.6 g/dL — AB (ref 3.5–5.2)
ALBUMIN: 1.5 g/dL — AB (ref 3.5–5.2)
ALK PHOS: 303 U/L — AB (ref 39–117)
ALT: 17 U/L (ref 0–35)
ALT: 14 U/L (ref 0–35)
AST: 60 U/L — AB (ref 0–37)
AST: 46 U/L — AB (ref 0–37)
Alkaline Phosphatase: 314 U/L — ABNORMAL HIGH (ref 39–117)
BILIRUBIN TOTAL: 0.8 mg/dL (ref 0.3–1.2)
BUN: 21 mg/dL (ref 6–23)
BUN: 20 mg/dL (ref 6–23)
CHLORIDE: 96 meq/L (ref 96–112)
CO2: 13 mEq/L — ABNORMAL LOW (ref 19–32)
CO2: 14 mEq/L — ABNORMAL LOW (ref 19–32)
CREATININE: 1.43 mg/dL — AB (ref 0.50–1.10)
Calcium: 7.5 mg/dL — ABNORMAL LOW (ref 8.4–10.5)
Calcium: 7.3 mg/dL — ABNORMAL LOW (ref 8.4–10.5)
Chloride: 101 mEq/L (ref 96–112)
Creatinine, Ser: 1.6 mg/dL — ABNORMAL HIGH (ref 0.50–1.10)
GFR calc Af Amer: 46 mL/min — ABNORMAL LOW (ref >90)
GFR calc Af Amer: 41 mL/min — ABNORMAL LOW (ref >90)
GFR calc non Af Amer: 35 mL/min — ABNORMAL LOW (ref >90)
GFR, EST NON AFRICAN AMERICAN: 40 mL/min — AB (ref >90)
Glucose, Bld: 128 mg/dL — ABNORMAL HIGH (ref 70–99)
Glucose, Bld: 165 mg/dL — ABNORMAL HIGH (ref 70–99)
Potassium: 5.2 mEq/L (ref 3.7–5.3)
Potassium: 4.8 mEq/L (ref 3.7–5.3)
Sodium: 130 mEq/L — ABNORMAL LOW (ref 137–147)
Sodium: 129 mEq/L — ABNORMAL LOW (ref 137–147)
Total Bilirubin: 0.8 mg/dL (ref 0.3–1.2)
Total Protein: 6.8 g/dL (ref 6.0–8.3)
Total Protein: 6.7 g/dL (ref 6.0–8.3)

## 2013-08-18 LAB — URINE CULTURE: Colony Count: >=100000

## 2013-08-18 LAB — MAGNESIUM, URINE: MAGNESIUM UR: 5.4 mg/dL

## 2013-08-18 LAB — CREATININE, URINE, RANDOM: CREATININE, URINE: 25.13 mg/dL

## 2013-08-18 LAB — PHOSPHORUS: PHOSPHORUS: 1.2 mg/dL — AB (ref 2.3–4.6)

## 2013-08-18 LAB — LACTIC ACID, PLASMA: Lactic Acid, Venous: 2.4 mmol/L — ABNORMAL HIGH (ref 0.5–2.2)

## 2013-08-18 MED ORDER — LORAZEPAM 2 MG/ML IJ SOLN: 0.0000 mg | Freq: Two times a day (BID) | INTRAMUSCULAR | Status: AC

## 2013-08-18 MED ORDER — K PHOS MONO-SOD PHOS DI & MONO 155-852-130 MG PO TABS
250.0000 mg | ORAL_TABLET | Freq: Four times a day (QID) | ORAL | Status: DC
  Administered 2013-08-18 – 2013-08-22 (×14): 250 mg via ORAL
  Filled 2013-08-18 (×19): qty 1

## 2013-08-18 MED ORDER — SODIUM CHLORIDE 0.9 % IV BOLUS (SEPSIS)
250.0000 mL | Freq: Once | INTRAVENOUS | Status: AC
  Administered 2013-08-18: 250 mL via INTRAVENOUS

## 2013-08-18 MED ORDER — MAGNESIUM SULFATE 4000MG/100ML IJ SOLN
4.0000 g | Freq: Once | INTRAMUSCULAR | Status: AC
  Administered 2013-08-18: 4 g via INTRAVENOUS
  Filled 2013-08-18: qty 100

## 2013-08-18 MED ORDER — ASPIRIN EC 81 MG PO TBEC
81.0000 mg | DELAYED_RELEASE_TABLET | Freq: Every evening | ORAL | Status: DC
  Administered 2013-08-18: 81 mg via ORAL
  Filled 2013-08-18 (×2): qty 1

## 2013-08-18 NOTE — Progress Notes (Signed)
  Date: 08/18/2013  Patient name: [name] [name]  Medical record number: [id number]  Date of birth: 10/16/1955   This patient has been seen and the plan of care was discussed with the house staff. Please see their note for complete details. I concur with their findings with the following additions/corrections: Admits to a dry cough. She has evidence of EtOH withdrawal.  This is a difficulty case. She was admitted for asymptomatic hypomagnesemia and found to have severe sepsis in the hospital. At this time, continue Vanc and Zosyn. Follow BC. Her CT high res is impressive. She also has evidence of UTI with >100k E coli. Zosyn will cover this. She has multiple electrolyte abn likely related to her etoh abuse, including Phos and Mag. These will need to be replaced cautiously.  Given her chronic hypomagnesemia, it may be useful to calculate FEMg, especially in the setting of receiving IV Mg. Although we assume low PO intake is likely the result, a wasting syndrome is also possible.   Dominic Pea, DO, [place] [facility] 08/18/2013, 3:42 PM

## 2013-08-18 NOTE — Progress Notes (Signed)
  I have seen and examined the patient, and reviewed the daily progress note by Conni Elliot, MS 3 and discussed the care of the patient with them. Please see my progress note from 08/18/2013 for further details regarding assessment and plan.    Signed:  Lesly Dukes, MD 08/18/2013, 12:58 PM

## 2013-08-18 NOTE — Progress Notes (Signed)
Subjective: The patient was seen in bed this morning. She was somnolent and aroused slowly to loud voice and touch. Her nasal cannulus was not placed inside her nares. She had auditory hallucinations of her brothers and sisters talking about their cars. Denies visual hallucinations, SI/HI, and states that her mood is "fine." Was febrile again last night with Tmax of 102.62F, currently 98.8. Had an episode of bleeding this morning with her 0600 heparin that was controlled with pressure and resolved by 10:00. Endorses a new cough and shortness of breath. Denies chest pain, heart palpitations, N/V/D.   Objective: Vital signs in last 24 hours: Filed Vitals:   08/18/13 0216 08/18/13 0236 08/18/13 0257 08/18/13 0432  BP:  99/64 95/65 111/71  Pulse:  126  126  Temp: 101.9 F (38.8 C) 99 F (37.2 C)  98.8 F (37.1 C)  TempSrc: Oral Oral  Oral  Resp:  18  18  Height:      Weight:      SpO2:  91%  90%   Weight change:   Intake/Output Summary (Last 24 hours) at 08/18/13 1018 Last data filed at 08/18/13 0900  Gross per 24 hour  Intake    816 ml  Output    850 ml  Net    -34 ml   Vitals reviewed. General: resting in bed, in NAD, appears malnourished HEENT: no scleral icterus, MMM Cardiac: tachycardia, no rubs, murmurs or gallops Pulm: bibasilar rales with R greater than L, no rhonchi or wheezing Abd: mild tenderness to palpation in LUQ, soft , nondistended, BS present Ext: warm and well perfused, no pedal edema Neuro: alert and oriented X3, moves all extremities voluntarily, no tremors or asterixis.   Lab Results: Basic Metabolic Panel:  Recent Labs Lab 08/16/13 0223  08/17/13 0550 08/17/13 1944  NA 136*  < > 133* 130*  K 4.1  < > 4.3 4.4  CL 104  < > 103 99  CO2 15*  < > 16* 14*  GLUCOSE 113*  < > 109* 289*  BUN 16  < > 18 18  CREATININE 1.13*  < > 1.46* 1.40*  CALCIUM 7.1*  < > 7.5* 7.1*  MG 2.2  < > 1.3* 1.6  PHOS 3.9  --  1.6*  --   < > = values in this interval not  displayed. Liver Function Tests:  Recent Labs Lab 08/15/13 1038 08/16/13 0223  AST 157* 183*  ALT 27 31  ALKPHOS 475* 487*  BILITOT 0.4 0.7  PROT 7.2 7.2  ALBUMIN 1.8* 1.9*    Recent Labs Lab 08/15/13 1038  LIPASE 8*   CBC:  Recent Labs Lab 08/15/13 1038  08/17/13 1345 08/18/13 0935  WBC 6.3  < > 7.1 9.0  NEUTROABS 3.7  --   --   --   HGB 8.0*  < > 7.3* 8.1*  HCT 23.3*  < > 21.2* 24.4*  MCV 98.7  < > 99.5 100.0  PLT 148*  < > 114* 123*  < > = values in this interval not displayed. Urine Drug Screen: Drugs of Abuse     Component Value Date/Time   LABOPIA NONE DETECTED 08/16/2013 0041   LABOPIA NEG 09/18/2011 0936   COCAINSCRNUR NONE DETECTED 08/16/2013 0041   COCAINSCRNUR NEG 09/18/2011 0936   LABBENZ NONE DETECTED 08/16/2013 0041   LABBENZ NEG 09/18/2011 0936   LABBENZ NEG 04/10/2011 1130   AMPHETMU NONE DETECTED 08/16/2013 0041   AMPHETMU NEG 04/10/2011 1130   THCU NONE  DETECTED 08/16/2013 0041   LABBARB NONE DETECTED 08/16/2013 0041   LABBARB NEG 09/18/2011 0936    Alcohol Level:  Recent Labs Lab 08/14/13 0958 08/15/13 1038  ETH 244* 169*   Urinalysis:  Recent Labs Lab 08/16/13 0041  COLORURINE YELLOW  LABSPEC 1.013  PHURINE 5.5  GLUCOSEU NEGATIVE  HGBUR NEGATIVE  BILIRUBINUR NEGATIVE  KETONESUR NEGATIVE  PROTEINUR 100*  UROBILINOGEN 0.2  NITRITE NEGATIVE  LEUKOCYTESUR NEGATIVE    Micro Results: Recent Results (from the past 240 hour(s))  URINE CULTURE     Status: None   Collection Time    08/16/13 12:41 AM      Result Value Ref Range Status   Specimen Description URINE, RANDOM   Final   Special Requests ADD [id number] [phone]   Final   Culture  Setup Time     Final   Value: 08/16/2013 14:10     Performed at [facility]     Final   Value: >=100,000 COLONIES/ML     Performed at [facility]   Culture     Final   Value: ESCHERICHIA COLI     Performed at [facility]   Report Status 08/18/2013 FINAL    Final   Organism ID, Bacteria ESCHERICHIA COLI   Final  CULTURE, BLOOD (ROUTINE X 2)     Status: None   Collection Time    08/16/13  2:20 AM      Result Value Ref Range Status   Specimen Description BLOOD LEFT HAND   Final   Special Requests BOTTLES DRAWN AEROBIC ONLY 10 CC   Final   Culture  Setup Time     Final   Value: 08/16/2013 12:16     Performed at [facility]   Culture     Final   Value:        BLOOD CULTURE RECEIVED NO GROWTH TO DATE CULTURE WILL BE HELD FOR 5 DAYS BEFORE ISSUING A FINAL NEGATIVE REPORT     Performed at [facility]   Report Status PENDING   Incomplete  CULTURE, BLOOD (ROUTINE X 2)     Status: None   Collection Time    08/16/13  2:24 AM      Result Value Ref Range Status   Specimen Description BLOOD LEFT HAND   Final   Special Requests BOTTLES DRAWN AEROBIC ONLY 10 CC   Final   Culture  Setup Time     Final   Value: 08/16/2013 12:16     Performed at [facility]   Culture     Final   Value:        BLOOD CULTURE RECEIVED NO GROWTH TO DATE CULTURE WILL BE HELD FOR 5 DAYS BEFORE ISSUING A FINAL NEGATIVE REPORT     Performed at [facility]   Report Status PENDING   Incomplete   Studies/Results: Ct Chest High Resolution  08/16/2013   CLINICAL DATA:  Dyspnea, dry rales in the right base and concordant radiographic abnormality. Evaluate for interstitial lung disease.  EXAM: CHEST CT WITHOUT CONTRAST  TECHNIQUE: Multidetector CT imaging of the chest was performed following the standard protocol without intravenous contrast. High resolution imaging of the lungs, as well as inspiratory and expiratory imaging, was performed.  COMPARISON:  Chest x-ray obtained earlier today 08/15/2013; most recent prior chest x-ray 12/06/2012  FINDINGS: Mediastinum: Unremarkable CT appearance of the thyroid gland. No suspicious mediastinal or hilar adenopathy. No soft tissue mediastinal  mass. The thoracic esophagus is unremarkable.  Heart/Vascular: Limited  evaluation in the absence of intravenous contrast material. 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 Conventional 3 vessel arch. The heart is within normal limits for size. No pericardial effusion.  Lungs/Pleura: Mild focal ground-glass attenuation opacity identified in the inferior periphery of the right upper lobe, the inferior aspect of the right middle lobe, and throughout the dependent portion of the right, and to a lesser extent the left lower lobes. Within the right lower lobe disease, there is a suggestion of interlobular septal thickening inferiorly as well as a more patchy consolidative changes in a peribronchovascular distribution. Trace emphysematous changes are noted in the upper lobes at the apices. Tiny subpleural calcified granuloma versus calcified subpleural lymph node in the periphery of the right upper lobe. No suspicious pulmonary mass or nodule. No pleural effusion or pneumothorax. The upper lungs are essentially clear. Evaluation of the inhalation and exhalation high-resolution CT images demonstrates no definite subpleural reticulation, architectural distortion or evidence of honeycombing to suggest interstitial lung disease.  Bones/Soft Tissues: No acute fracture or aggressive appearing lytic or blastic osseous lesion.  Upper Abdomen: Visualized upper abdomen is unremarkable.  IMPRESSION: 1. Predominantly dependent distribution of ground-glass attenuation airspace disease primarily affecting of the right lower lobe, and to a lesser extent the left lower lobe and inferior aspects of the right upper and middle lobes. Within the right lower lobe there are areas of superimposed patchy consolidative changes in a peribronchovascular distribution. Overall, the findings are most consistent with a multifocal infectious/inflammatory process including multifocal bronchopneumonia, atypical infection, or pneumonitis such as nonspecific interstitial pneumonitis (NSIP) or even  aspiration in the appropriate clinical setting. Dependent interstitial edema with developing asymmetric alveolar edema in the right lower lobe is another possibility but considered less likely. 2. Mild biapical and medial upper lung paraseptal emphysema. 3. Mild sequelae of prior granulomatous disease. 4. Mild ectasia of the ascending thoracic aorta. Findings reviewed with the internal medicine team at the workstation at the time of interpretation. This study will also be reviewed by our dedicated Chest radiologists early next week. Any significant addendum to the above interpretation will be communicated to the admitting team.   Electronically Signed   By: Malachy Moan M.D.   On: 08/16/2013 13:57   Medications: I have reviewed the patient's current medications. Scheduled Meds: . aspirin EC  81 mg Oral QPM  . calcium carbonate  6 tablet Oral TID  . feeding supplement (ENSURE COMPLETE)  237 mL Oral BID BM  . ferrous sulfate  325 mg Oral BID WC  . FLUoxetine  10 mg Oral Daily  . folic acid  1 mg Oral Daily  . gabapentin  600 mg Oral TID  . heparin  5,000 Units Subcutaneous 3 times per day  . lipase/protease/amylase  1 capsule Oral TID AC  . LORazepam  0-4 mg Intravenous Q12H  . magnesium oxide  400 mg Oral QID  . multivitamin with minerals  1 tablet Oral Daily  . pantoprazole  40 mg Oral Daily  . piperacillin-tazobactam (ZOSYN)  IV  3.375 g Intravenous 3 times per day  . sodium chloride  3 mL Intravenous Q12H  . sodium chloride  3 mL Intravenous Q12H  . thiamine  100 mg Oral Daily   Or  . thiamine  100 mg Intravenous Daily  . vancomycin  500 mg Intravenous Q24H  . vitamin B-12  250 mcg Oral QPM   Continuous Infusions:  PRN Meds:.sodium chloride, acetaminophen, LORazepam, LORazepam, promethazine, promethazine, sodium chloride Assessment/Plan: 58 year old female with a history of EtOH abuse, chronic pancreatitis, protein calorie malnutrition, hypocalcemia and hypomagnesemia, HTN, CKD3  who presents to the ED because she was instructed to do so by the [facility] for low magnesium.   #Severe sepsis - She has fever to 102.9, tachycardia 127, elevated lactate to 3.3. Sources could be UTI and PNA. - IVF NS @50  cc/hr, encouraged oral hydration - Vanc and zosyn per pharmacy, to cover for MRSA and anaerobes given risk of aspiration  - Continuous pulse ox.  #Pneumonia : CAP v aspiration. She was afebrile on presentation with no cough or leucocytosis but she had rales over her RLL on physical exam. Her CXR appearance was consistent with interstitial process but a high resolution CT chest was performed for better evaluation of interstitial lung disease. Her CT chest has been done with no formal read but we have spoken to the in house radiologist who describes her radiologic lung findings consistent with a multifocal pneumonia worse in the RLL v pneumonitis. The patient states that sometimes she "swallowed wrong" at home but denies dysphagia or chocking.  - Treatment as above with Vanc and Zosyn   #Hypomagnesemia - Persistent. Mg of 0.8 on presentation, improved with 4g of IV magnesium sulfate. Still 1.6 this morning.  Phosphorus slightly low at 1.6.  and potassium are also within normal limits.  - Continue home mag oxide 400mg  QID  - Continue to monitor BMP, mag BID  - Magnesium sulfate 4g infusion  #Hypophosphatemia Low at 1.2 Only two options are oral KPhos (her K is 5.4) or IV Sodium Phos, which can cause arrhythmias and renal failure. - Give oral KPhos, only 29mEq of K. - Encourage oral intake - Monitor for s/s hyperkalemia - BMET and PO4 tomorrow AM.   #Hypocalcemia in the setting of hypoalbuminemia/polyclonal gammopathy, resolved - Corrected calcium is 8.7 today.  - Continue home calcium carbonate 500mg  6 tabs (1200 mg elemental calcium) 3 times a day   #Alcohol abuse - CIWA 5 this morning for sweating and visual disturbance. UDS negative. No tremors currently but tachycardic but with  sepsis.   - CIWA protocol  - Folate, thiamine, multivitamin   #Severe protein calorie malnutrition - Albumin 1.9 on presentation. She admits to most of her daily po intake being "water and wine."  - Appreciate nutrition recs  - Regular, liberalized diet  - Ensure feeding supplements twice a day between meals  - Will refer patient to outpatient Internal Medicine RD   #Normocytic anemia - Baseline hemoglobin 8.8-10.7. She has a history of B12 deficiency and folate deficiency causing macrocytic anemia. She is also on iron supplementation for a history of iron deficiency anemia. She may have a mixed process here causing normocytic anemia. She is not compliant with her medicines. FOBT negative in the ED.  - Continue home vitamin B12 250 mcg every evening  - Continue home ferrous sulfate 325 mg twice a day with meals  - Folate, thiamine, multivitamin  - Daily CBC   #CKD3 - Baseline Cr 1.2-1.3 Currently 1.40. BUN 18. - Continue to monitor   #Chronic anion gap metabolic acidosis - AG 17, stable. ABG shows primary metabolic acidosis, with increased anion gap,with full respiratory compensation, likely 2/2 lactic acidosis. Lactate down to 2.4UA negative for ketones.  - Treating sepsis as above  ABG    Component  Value  Date/Time    PHART  7.324*  08/16/2013 0940  PCO2ART  29.5*  08/16/2013 0940    PO2ART  73.8*  08/16/2013 0940    HCO3  14.9*  08/16/2013 0940    TCO2  15.7  08/16/2013 0940    ACIDBASEDEF  9.9*  08/16/2013 0940    O2SAT  93.2  08/16/2013 0940    #Chronic pancreatitis - Stable. Patient denies abdominal pain, nausea, vomiting, diarrhea.  - Continue home Creon   #Depression - Apathy and blunted affect noted on exam.  - Continue home Prozac 10 mg daily   #HTN - Hypotensive this morning with BP of 89/57  - Hold home amlodipine 5 mg daily  - Continue home aspirin 81 mg daily  - Stopping home Lasix 20 mg daily given concern for hypovolemia   #Tachycardia - A chronic issue per  chart review, but now with fever. Normotensive.  - Continue to monitor on telemetry   #GERD - Continue home Protonix 40 mg daily   #Neuropathic pain/anxiety - Continue home gabapentin 600 mg 3 times a day   #DVT PPX - subcutaneous heparin  Dispo: Disposition is deferred at this time, awaiting improvement of current medical problems.  Anticipated discharge in approximately 1-2 day(s).   This is a Careers information officer Note.  The care of the patient was discussed with Dr. Lucila Maine and the assessment and plan formulated with their assistance.  Please see their attached note for official documentation of the daily encounter.   LOS: 3 days   Donnelly Angelica, Med Student 08/18/2013, 10:23 AM

## 2013-08-18 NOTE — Progress Notes (Addendum)
Subjective: Patient seen and examined at the bedside this morning. She is somnolent but arousable. She got Ativan 2 hours ago per CIWA protocol.   She tells me she now has a nonproductive cough. Denies shortness of breath. She had an episode of right-sided chest pain and heart racing lasting a couple minutes last night. She denies chest pain now. She has some pain in her RLQ. (She has a bandage there over an oozing site where her subq heparin was given.) Denies nausea/vomiting/diarrhea.  She tells me she was "hearing her family" last night. Endorses some anxiety.  Telemetry shows sinus tachycardia overnight.  Objective: Vital signs in last 24 hours: Filed Vitals:   08/18/13 0216 08/18/13 0236 08/18/13 0257 08/18/13 0432  BP:  99/64 95/65 111/71  Pulse:  126  126  Temp: 101.9 F (38.8 C) 99 F (37.2 C)  98.8 F (37.1 C)  TempSrc: Oral Oral  Oral  Resp:  18  18  Height:      Weight:      SpO2:  91%  90%   Weight change:   Intake/Output Summary (Last 24 hours) at 08/18/13 1054 Last data filed at 08/18/13 1000  Gross per 24 hour  Intake    816 ml  Output   1250 ml  Net   -434 ml   Vitals reviewed. General: resting in bed, in NAD, appears malnourished, calm, nasal cannula oxygen @1L  in place but not in nares HEENT: no scleral icterus, MMM Cardiac: tachycardia, no rubs, murmurs or gallops Pulm: wet, bibasilar rales with R greater than L, no rhonchi or wheezing Abd: soft, some tenderness over suprapubic area to deep palpation, nondistended, BS present, bandage in RLQ over an oozing site where her subq heparin was given Ext: warm and well perfused, no pedal edema Neuro: alert and oriented X3, moves all extremities voluntarily, no tremors or asterixis  Lab Results: Basic Metabolic Panel:  Recent Labs Lab 08/17/13 0550 08/17/13 1944 08/18/13 0935  NA 133* 130* 130*  K 4.3 4.4 5.2  CL 103 99 101  CO2 16* 14* 13*  GLUCOSE 109* 289* 128*  BUN 18 18 21   CREATININE 1.46*  1.40* 1.43*  CALCIUM 7.5* 7.1* 7.5*  MG 1.3* 1.6 1.4*  PHOS 1.6*  --  1.2*   Liver Function Tests:  Recent Labs Lab 08/16/13 0223 08/18/13 0935  AST 183* 60*  ALT 31 17  ALKPHOS 487* 314*  BILITOT 0.7 0.8  PROT 7.2 6.8  ALBUMIN 1.9* 1.6*    Recent Labs Lab 08/15/13 1038  LIPASE 8*   CBC:  Recent Labs Lab 08/15/13 1038  08/17/13 1345 08/18/13 0935  WBC 6.3  < > 7.1 9.0  NEUTROABS 3.7  --   --   --   HGB 8.0*  < > 7.3* 8.1*  HCT 23.3*  < > 21.2* 24.4*  MCV 98.7  < > 99.5 100.0  PLT 148*  < > 114* 123*  < > = values in this interval not displayed. Urine Drug Screen: Drugs of Abuse     Component Value Date/Time   LABOPIA NONE DETECTED 08/16/2013 0041   LABOPIA NEG 09/18/2011 0936   COCAINSCRNUR NONE DETECTED 08/16/2013 0041   COCAINSCRNUR NEG 09/18/2011 0936   LABBENZ NONE DETECTED 08/16/2013 0041   LABBENZ NEG 09/18/2011 0936   LABBENZ NEG 04/10/2011 1130   AMPHETMU NONE DETECTED 08/16/2013 0041   AMPHETMU NEG 04/10/2011 1130   THCU NONE DETECTED 08/16/2013 0041   LABBARB NONE DETECTED 08/16/2013 0041  LABBARB NEG 09/18/2011 0936    Alcohol Level:  Recent Labs Lab 08/14/13 0958 08/15/13 1038  ETH 244* 169*   Urinalysis:  Recent Labs Lab 08/16/13 0041  COLORURINE YELLOW  LABSPEC 1.013  PHURINE 5.5  GLUCOSEU NEGATIVE  HGBUR NEGATIVE  BILIRUBINUR NEGATIVE  KETONESUR NEGATIVE  PROTEINUR 100*  UROBILINOGEN 0.2  NITRITE NEGATIVE  LEUKOCYTESUR NEGATIVE    Micro Results: Recent Results (from the past 240 hour(s))  URINE CULTURE     Status: None   Collection Time    08/16/13 12:41 AM      Result Value Ref Range Status   Specimen Description URINE, RANDOM   Final   Special Requests ADD [id number]   Final   Culture  Setup Time     Final   Value: 08/16/2013 14:10     Performed at [place]     Final   Value: >=100,000 COLONIES/ML     Performed at [facility]   Culture     Final   Value: ESCHERICHIA COLI      Performed at [facility]   Report Status 08/18/2013 FINAL   Final   Organism ID, Bacteria ESCHERICHIA COLI   Final  CULTURE, BLOOD (ROUTINE X 2)     Status: None   Collection Time    08/16/13  2:20 AM      Result Value Ref Range Status   Specimen Description BLOOD LEFT HAND   Final   Special Requests BOTTLES DRAWN AEROBIC ONLY 10 CC   Final   Culture  Setup Time     Final   Value: 08/16/2013 12:16     Performed at [facility]   Culture     Final   Value:        BLOOD CULTURE RECEIVED NO GROWTH TO DATE CULTURE WILL BE HELD FOR 5 DAYS BEFORE ISSUING A FINAL NEGATIVE REPORT     Performed at [facility]   Report Status PENDING   Incomplete  CULTURE, BLOOD (ROUTINE X 2)     Status: None   Collection Time    08/16/13  2:24 AM      Result Value Ref Range Status   Specimen Description BLOOD LEFT HAND   Final   Special Requests BOTTLES DRAWN AEROBIC ONLY 10 CC   Final   Culture  Setup Time     Final   Value: 08/16/2013 12:16     Performed at [facility]   Culture     Final   Value:        BLOOD CULTURE RECEIVED NO GROWTH TO DATE CULTURE WILL BE HELD FOR 5 DAYS BEFORE ISSUING A FINAL NEGATIVE REPORT     Performed at [facility]   Report Status PENDING   Incomplete   Studies/Results: Ct Chest High Resolution  08/16/2013   CLINICAL DATA:  Dyspnea, dry rales in the right base and concordant radiographic abnormality. Evaluate for interstitial lung disease.  EXAM: CHEST CT WITHOUT CONTRAST  TECHNIQUE: Multidetector CT imaging of the chest was performed following the standard protocol without intravenous contrast. High resolution imaging of the lungs, as well as inspiratory and expiratory imaging, was performed.  COMPARISON:  Chest x-ray obtained earlier today 08/15/2013; most recent prior chest x-ray 12/06/2012  FINDINGS: Mediastinum: Unremarkable CT appearance of the thyroid gland. No suspicious mediastinal or hilar adenopathy. No soft tissue mediastinal  mass. The thoracic esophagus is unremarkable.  Heart/Vascular: Limited evaluation in  the absence of intravenous contrast material. 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 Conventional 3 vessel arch. The heart is within normal limits for size. No pericardial effusion.  Lungs/Pleura: Mild focal ground-glass attenuation opacity identified in the inferior periphery of the right upper lobe, the inferior aspect of the right middle lobe, and throughout the dependent portion of the right, and to a lesser extent the left lower lobes. Within the right lower lobe disease, there is a suggestion of interlobular septal thickening inferiorly as well as a more patchy consolidative changes in a peribronchovascular distribution. Trace emphysematous changes are noted in the upper lobes at the apices. Tiny subpleural calcified granuloma versus calcified subpleural lymph node in the periphery of the right upper lobe. No suspicious pulmonary mass or nodule. No pleural effusion or pneumothorax. The upper lungs are essentially clear. Evaluation of the inhalation and exhalation high-resolution CT images demonstrates no definite subpleural reticulation, architectural distortion or evidence of honeycombing to suggest interstitial lung disease.  Bones/Soft Tissues: No acute fracture or aggressive appearing lytic or blastic osseous lesion.  Upper Abdomen: Visualized upper abdomen is unremarkable.  IMPRESSION: 1. Predominantly dependent distribution of ground-glass attenuation airspace disease primarily affecting of the right lower lobe, and to a lesser extent the left lower lobe and inferior aspects of the right upper and middle lobes. Within the right lower lobe there are areas of superimposed patchy consolidative changes in a peribronchovascular distribution. Overall, the findings are most consistent with a multifocal infectious/inflammatory process including multifocal bronchopneumonia, atypical infection, or  pneumonitis such as nonspecific interstitial pneumonitis (NSIP) or even aspiration in the appropriate clinical setting. Dependent interstitial edema with developing asymmetric alveolar edema in the right lower lobe is another possibility but considered less likely. 2. Mild biapical and medial upper lung paraseptal emphysema. 3. Mild sequelae of prior granulomatous disease. 4. Mild ectasia of the ascending thoracic aorta. Findings reviewed with the internal medicine team at the workstation at the time of interpretation. This study will also be reviewed by our dedicated Chest radiologists early next week. Any significant addendum to the above interpretation will be communicated to the admitting team.   Electronically Signed   By: Malachy Moan M.D.   On: 08/16/2013 13:57   Medications: I have reviewed the patient's current medications. Scheduled Meds: . calcium carbonate  6 tablet Oral TID  . feeding supplement (ENSURE COMPLETE)  237 mL Oral BID BM  . ferrous sulfate  325 mg Oral BID WC  . FLUoxetine  10 mg Oral Daily  . folic acid  1 mg Oral Daily  . gabapentin  600 mg Oral TID  . heparin  5,000 Units Subcutaneous 3 times per day  . lipase/protease/amylase  1 capsule Oral TID AC  . LORazepam  0-4 mg Intravenous Q12H  . magnesium oxide  400 mg Oral QID  . magnesium sulfate 1 - 4 g bolus IVPB  4 g Intravenous Once  . multivitamin with minerals  1 tablet Oral Daily  . pantoprazole  40 mg Oral Daily  . phosphorus  250 mg Oral QID  . piperacillin-tazobactam (ZOSYN)  IV  3.375 g Intravenous 3 times per day  . sodium chloride  3 mL Intravenous Q12H  . sodium chloride  3 mL Intravenous Q12H  . thiamine  100 mg Oral Daily   Or  . thiamine  100 mg Intravenous Daily  . vancomycin  500 mg Intravenous Q24H  . vitamin B-12  250 mcg Oral QPM  Continuous Infusions:   PRN Meds:.sodium chloride, acetaminophen, LORazepam, LORazepam, promethazine, promethazine, sodium chloride  Assessment/Plan: 58  year old female with a history of EtOH abuse, chronic pancreatitis, protein calorie malnutrition, hypocalcemia and hypomagnesemia, HTN, CKD3 who presents to the ED because she was instructed to do so by the [facility] for low magnesium, found to have a pneumonia and sepsis.   #Severe sepsis - TMax 102.9 overnight. Still with tachycardia 126, elevated lactate to 3.3. BP 90-120s/60-80s. Got 250cc bolus for BP 90s/50s. Likely source is pneumonia +/- UTI. Blood cultures are NGTD. Lactate down trending to 2.4 today. - IVF NS @50  cc/hr, also encouraging oral hydration - Vanc and zosyn per pharmacy, to cover for MRSA and anaerobes given risk of aspiration  - If spikes a fever again, will repeat blood cultures  #Pneumonia, CAP v aspiration - She was afebrile on presentation with no cough or leucocytosis, but noted to have velco-like rales over her RLL on physical exam. CXR appearance was consistent with interstitial process, so a high resolution CT chest was performed on 3/14. It showed ground-glass opacities bilaterally and an area of patchy consolidative changes in the RLL concerning for a multifocal infectious/inflammatory process. She is an alcoholic and has a history of aspiration pneumonia. O2 sats 90-92 on room air. She now endorses a nonproductive cough. Still without leukocytosis, but trending up (6 > 9). - Treatment as above with Vanc and Zosyn  - Continuous pulse ox - CBC daily  #UTI - Complaints of urinary frequency. UA with many bacteria, rare squams. Urine culture is growing >100K col/ml of E. Coli, pan sensitive (including to Zosyn). - Continue antibiotics as above  #Hypomagnesemia/hypophosphatemia - Persistent. Mag 1.4 this am.  - Magnesium sulfate 4g infusion over 120 minutes - Continue home mag oxide 400mg  QID - Continue to monitor BMP, mag BID   Magnesium  Date Value Ref Range Status  08/18/2013 1.4* 1.5 - 2.5 mg/dL Final  08/17/2013 1.6  1.5 - 2.5 mg/dL Final  08/17/2013 1.3* 1.5 - 2.5  mg/dL Final  08/16/2013 1.4* 1.5 - 2.5 mg/dL Final  08/16/2013 2.2  1.5 - 2.5 mg/dL Final    #Hypophosphatemia - We are monitoring daily phosphorus due to risk of refeeding syndrome. Today her phosphorus is low at 1.2, potassium is 5.2 (borderline high). Spoke with pharmacist Pilar Plate) about risks and benefits of po vs IV repletion. Oral "K Phos neutral" has potassium in it (about 24mEq per packet), but it is slowly absorbed and would probably be safer than IV. Intravenous phosphate does not contain K, but is potentially dangerous since it can precipitate with calcium and produce a variety of adverse effects including hypocalcemia due to binding of calcium, renal failure due to calcium phosphate precipitation in the kidneys, and possibly fatal arrhythmias. - K phos neutral 250mg  QID - Afternoon BMP as above to monitor K  Phosphorus  Date Value Ref Range Status  08/18/2013 1.2* 2.3 - 4.6 mg/dL Final  08/17/2013 1.6* 2.3 - 4.6 mg/dL Final  08/16/2013 3.9  2.3 - 4.6 mg/dL Final  08/15/2013 3.5  2.3 - 4.6 mg/dL Final  12/07/2012 3.7  2.3 - 4.6 mg/dL Final    #Acute on chronic normocytic anemia - Baseline hemoglobin 8.8-10.7. At baseline on admission, but was down trending with IVF. FOBT negative and no gross bleeding. She has a history of B12 deficiency and folate deficiency causing macrocytic anemia. She is also on iron supplementation for a history of iron deficiency anemia. She had some oozing and  pain from her heparin injection site, with some bleeding per overnight nurse. Platelets are 123 (her baseline). - Holding heparin and ASA for now - Place and maintain SCDs - Continue home vitamin B12 250 mcg every evening  - Continue home ferrous sulfate 325 mg twice a day with meals  - Folate, thiamine, multivitamin  - Transfusion threshold <7, type and screen has been performed - Daily CBC, monitor platelets  Hemoglobin  Date Value Ref Range Status  08/18/2013 8.1* 12.0 - 15.0 g/dL Final  08/17/2013 7.3*  12.0 - 15.0 g/dL Final  08/17/2013 6.9* 12.0 - 15.0 g/dL Final     REPEATED TO VERIFY     CRITICAL RESULT CALLED TO, READ BACK BY AND VERIFIED WITH:     SCHESCL,L RN 08/17/13 1213 WOOTEN,K  08/16/2013 7.7* 12.0 - 15.0 g/dL Final  08/15/2013 8.0* 12.0 - 15.0 g/dL Final    #Hypocalcemia in the setting of hypoalbuminemia/polyclonal gammopathy, resolved - Corrected calcium is 9.4 today.  - Continue home calcium carbonate 500mg  6 tabs (1200 mg elemental calcium) 3 times a day   #Alcohol abuse - CIWA 5-6 overnight for mild tremor, sweating, auditory and visual disturbance, anxiety. UDS negative.  - CIWA protocol  - Folate, thiamine, multivitamin  - Will not schedule Ativan as she appears to be quite sensitive to it  #Severe protein calorie malnutrition - Albumin 1.9 on presentation. She admits to most of her daily po intake being "water and wine."  - Appreciate nutrition recs  - Regular, liberalized diet  - Ensure feeding supplements twice a day between meals  - Will refer patient to outpatient Internal Medicine RD at discharge  #CKD3 - Baseline Cr 1.2-1.3 Currently 1.43. - IVF as above - Continue to monitor   #Chronic anion gap metabolic acidosis - AG 16, stable. ABG shows primary metabolic acidosis, with increased anion gap,with full respiratory compensation, likely 2/2 lactic acidosis. UA negative for ketones.  - Treating sepsis as above   ABG    Component  Value  Date/Time    PHART  7.324*  08/16/2013 0940    PCO2ART  29.5*  08/16/2013 0940    PO2ART  73.8*  08/16/2013 0940    HCO3  14.9*  08/16/2013 0940    TCO2  15.7  08/16/2013 0940    ACIDBASEDEF  9.9*  08/16/2013 0940    O2SAT  93.2  08/16/2013 0940    #Chronic pancreatitis - Stable. Patient denies nausea, vomiting, diarrhea.  - Continue home Creon   #Depression - Apathy and blunted affect noted on exam.  - Continue home Prozac 10 mg daily   #HTN - BP 111/71, low of 95/65 overnight which responded to a 250cc fluid bolus. -  Holding home amlodipine 5 mg daily  - Continue home aspirin 81 mg daily  - Stopping home Lasix 20 mg daily given concern for hypovolemia   #Tachycardia - A chronic issue per chart review, but now with fever and lactic acidosis. Normotensive.  - Continue to monitor on telemetry   #GERD - Continue home Protonix 40 mg daily   #Neuropathic pain/anxiety - Continue home gabapentin 600 mg 3 times a day   #DVT PPX - SCDs  Dispo: Disposition is deferred at this time, awaiting improvement of current medical problems.  Anticipated discharge in approximately 1-2 day(s).   The patient does have a current PCP Otho Bellows, MD) and does need an [facility] hospital follow-up appointment after discharge.  The patient does not have transportation limitations that hinder  transportation to clinic appointments.  .Services Needed at time of discharge: Y = Yes, Blank = No PT:   OT:   RN:   Equipment:   Other:     LOS: 3 days   Lesly Dukes, MD 08/18/2013, 10:54 AM  Lesly Dukes, MD  [email or web address] Pager # [phone] Office # [phone]

## 2013-08-19 ENCOUNTER — Inpatient Hospital Stay (HOSPITAL_COMMUNITY): Payer: PRIVATE HEALTH INSURANCE

## 2013-08-19 DIAGNOSIS — N179 Acute kidney failure, unspecified: Secondary | ICD-10-CM

## 2013-08-19 DIAGNOSIS — R6520 Severe sepsis without septic shock: Secondary | ICD-10-CM

## 2013-08-19 DIAGNOSIS — K922 Gastrointestinal hemorrhage, unspecified: Secondary | ICD-10-CM

## 2013-08-19 DIAGNOSIS — A419 Sepsis, unspecified organism: Secondary | ICD-10-CM

## 2013-08-19 LAB — COMPREHENSIVE METABOLIC PANEL
ALT: 12 U/L (ref 0–35)
AST: 38 U/L — ABNORMAL HIGH (ref 0–37)
Albumin: 1.5 g/dL — ABNORMAL LOW (ref 3.5–5.2)
Alkaline Phosphatase: 255 U/L — ABNORMAL HIGH (ref 39–117)
BUN: 22 mg/dL (ref 6–23)
CALCIUM: 7.2 mg/dL — AB (ref 8.4–10.5)
CO2: 18 mEq/L — ABNORMAL LOW (ref 19–32)
Chloride: 98 mEq/L (ref 96–112)
Creatinine, Ser: 1.93 mg/dL — ABNORMAL HIGH (ref 0.50–1.10)
GFR calc non Af Amer: 28 mL/min — ABNORMAL LOW (ref >90)
GFR, EST AFRICAN AMERICAN: 32 mL/min — AB (ref >90)
Glucose, Bld: 109 mg/dL — ABNORMAL HIGH (ref 70–99)
Potassium: 5.1 mEq/L (ref 3.7–5.3)
SODIUM: 131 meq/L — AB (ref 137–147)
Total Bilirubin: 0.9 mg/dL (ref 0.3–1.2)
Total Protein: 5.9 g/dL — ABNORMAL LOW (ref 6.0–8.3)

## 2013-08-19 LAB — PROTIME-INR
INR: 1.41 (ref 0.00–1.49)
PROTHROMBIN TIME: 16.9 s — AB (ref 11.6–15.2)

## 2013-08-19 LAB — CBC
HCT: 14.4 % — ABNORMAL LOW (ref 36.0–46.0)
HCT: 14.6 % — ABNORMAL LOW (ref 36.0–46.0)
Hemoglobin: 5 g/dL — CL (ref 12.0–15.0)
Hemoglobin: 5.1 g/dL — CL (ref 12.0–15.0)
MCH: 33.8 pg (ref 26.0–34.0)
MCH: 34.7 pg — AB (ref 26.0–34.0)
MCHC: 34.7 g/dL (ref 30.0–36.0)
MCHC: 34.9 g/dL (ref 30.0–36.0)
MCV: 97.3 fL (ref 78.0–100.0)
MCV: 99.3 fL (ref 78.0–100.0)
PLATELETS: 115 10*3/uL — AB (ref 150–400)
Platelets: 117 10*3/uL — ABNORMAL LOW (ref 150–400)
RBC: 1.48 MIL/uL — ABNORMAL LOW (ref 3.87–5.11)
RBC: 1.47 MIL/uL — AB (ref 3.87–5.11)
RDW: 13.6 % (ref 11.5–15.5)
RDW: 13.7 % (ref 11.5–15.5)
WBC: 14.3 10*3/uL — ABNORMAL HIGH (ref 4.0–10.5)
WBC: 14.7 10*3/uL — ABNORMAL HIGH (ref 4.0–10.5)

## 2013-08-19 LAB — VANCOMYCIN, TROUGH: VANCOMYCIN TR: 14 ug/mL (ref 10.0–20.0)

## 2013-08-19 LAB — PREPARE RBC (CROSSMATCH)

## 2013-08-19 LAB — LACTIC ACID, PLASMA: LACTIC ACID, VENOUS: 2 mmol/L (ref 0.5–2.2)

## 2013-08-19 LAB — CLOSTRIDIUM DIFFICILE BY PCR: Toxigenic C. Difficile by PCR: NEGATIVE

## 2013-08-19 LAB — PHOSPHORUS: Phosphorus: 1.7 mg/dL — ABNORMAL LOW (ref 2.3–4.6)

## 2013-08-19 LAB — MRSA PCR SCREENING: MRSA by PCR: NEGATIVE

## 2013-08-19 LAB — OCCULT BLOOD X 1 CARD TO LAB, STOOL: FECAL OCCULT BLD: POSITIVE — AB

## 2013-08-19 LAB — MAGNESIUM: MAGNESIUM: 2.1 mg/dL (ref 1.5–2.5)

## 2013-08-19 MED ORDER — ENSURE COMPLETE PO LIQD
237.0000 mL | Freq: Three times a day (TID) | ORAL | Status: DC
  Administered 2013-08-20 – 2013-08-22 (×4): 237 mL via ORAL

## 2013-08-19 MED ORDER — ACETAMINOPHEN 650 MG RE SUPP: 325.0000 mg | Freq: Four times a day (QID) | RECTAL | Status: DC | PRN

## 2013-08-19 MED ORDER — SODIUM CHLORIDE 0.9 % IV SOLN
INTRAVENOUS | Status: DC
  Administered 2013-08-19: 1000 mL via INTRAVENOUS
  Administered 2013-08-20 – 2013-08-22 (×4): via INTRAVENOUS

## 2013-08-19 MED ORDER — PIPERACILLIN-TAZOBACTAM IN DEX 2-0.25 GM/50ML IV SOLN
2.2500 g | Freq: Four times a day (QID) | INTRAVENOUS | Status: DC
  Administered 2013-08-19 – 2013-08-22 (×11): 2.25 g via INTRAVENOUS
  Filled 2013-08-19 (×16): qty 50

## 2013-08-19 MED ORDER — PANTOPRAZOLE SODIUM 40 MG PO TBEC: 40.0000 mg | DELAYED_RELEASE_TABLET | Freq: Two times a day (BID) | ORAL | Status: DC

## 2013-08-19 MED ORDER — PANTOPRAZOLE SODIUM 40 MG IV SOLR
40.0000 mg | Freq: Two times a day (BID) | INTRAVENOUS | Status: DC
  Administered 2013-08-19 – 2013-08-22 (×7): 40 mg via INTRAVENOUS
  Filled 2013-08-19 (×10): qty 40

## 2013-08-19 MED ORDER — SODIUM CHLORIDE 0.9 % IV BOLUS (SEPSIS)
500.0000 mL | Freq: Once | INTRAVENOUS | Status: AC
  Administered 2013-08-19: 500 mL via INTRAVENOUS

## 2013-08-19 MED ORDER — SODIUM CHLORIDE 0.9 % IV BOLUS (SEPSIS): 250.0000 mL | Freq: Once | INTRAVENOUS | Status: DC

## 2013-08-19 NOTE — Progress Notes (Signed)
Pt. Lost IV site when attempted to give 500 ml bolus.  Unsuccessfully attempt IV stick and notified IV team.  While documented assessment noted IV team had tried several time to get last IV, so paged 1 call and no return call.  IV team up to room & didn't see site as well, wanted RN to get order for PICC line.  Paged 2nd contact and return call from Dr. Harlow Ohms, stated they would call critical care to place central line.  Will continue to monitor.  Alphonzo Lemmings, RN

## 2013-08-19 NOTE — Progress Notes (Signed)
CRITICAL VALUE ALERT  Critical value received:  HBG 5.0  Date of notification:  08/19/13  Time of notification:  0808  Critical value read back:yes  Nurse who received alert:  Alphonzo Lemmings, RN   MD notified (1st page):  Spoke with Conni Elliot, Med. Student  Time of first page:  0845  MD notified (2nd page):  Time of second page:  Responding MD:  Conni Elliot, stated Dr. Harlow Ohms was already aware and orders was in for [facility]  Time MD responded:  [phone]

## 2013-08-19 NOTE — Progress Notes (Signed)
CRITICAL VALUE ALERT  Critical value received:  Hgb 5.1  Date of notification:  08/19/13  Time of notification:  1218  Critical value read back:yes  Nurse who received alert:  Rip Harbour    MD notified (1st page):  Conni Elliot  Time of first page:  1223  MD notified (2nd page):  Time of second page:  Responding MD:    Time MD responded:

## 2013-08-19 NOTE — Progress Notes (Addendum)
  Date: 08/19/2013  Patient name: [name] [name]  Medical record number: [id number]  Date of birth: 1956-03-09   This patient has been seen and the plan of care was discussed with the house staff. Please see their note for complete details. I concur with their findings with the following additions/corrections: Overnight events noted. She has evidence of GI bleed with drop in H/H. Fever noted. At this time, she remains hemodynamically stable. Agree with GI involvement. PPI IV. Two large bore PIVs at this time. Stop heparin and ASA. Follow H/H q6 hrs. Transfuse as you are doing.  She has severe sepsis, I agree. Continue Vanc and Zosyn. Follow BC. Her Cr is increasing out of proportion to BUN. In the setting of severe sepsis, this is likely the cause.  Move to step down unit.  Dominic Pea, DO, [facility] 08/19/2013, 11:37 AM

## 2013-08-19 NOTE — Progress Notes (Signed)
Pt had large, soft stool with dark red blood. MD notified - hemoccult sample sent. Pt in no s/s of distress. Will continue to monitor.

## 2013-08-19 NOTE — Progress Notes (Signed)
NUTRITION FOLLOW-UP  DOCUMENTATION CODES Per approved criteria  -Severe  malnutrition in the context of social or environmental circumstances -Underweight   Pt meets criteria for severe MALNUTRITION in the context of social/environmental circumstances as evidenced by severe fat and muscle mass loss, intake of <50% x at least 3 months.  INTERVENTION: Recommend continued monitoring of magnesium, potassium, and phosphorus until stabilized, MD to replete as needed, as pt is at risk for refeeding syndrome given severe malnutrition. Increase Ensure Complete to PO TID. Agree with Regular, liberalized diet. Please refer patient to outpatient Internal Medicine RD when d/c. RD to continue to follow nutrition care plan.  NUTRITION DIAGNOSIS: Malnutrition related to ETOH abuse as evidenced by severe fat and muscle mass loss. Ongoing.  Goal: Intake to meet >90% of estimated nutrition needs. Improving.  Monitor:  weight trends, lab trends, I/O's, PO intake, supplement tolerance  ASSESSMENT: PMHx significant for ETOH abuse, chronic pancreatitis, malnutrition, hypocalcemia, hypomagnesemia, HTN, CKD3. Admitted with low magnesium s/p outpatient appointment.   Patient is eating about 50% of meals. Currently eating breakfast, eating a bacon and egg sandwich. She states that she is drinking her Ensure, will increase to three times daily to maximize calories while admitted.  Pt with bloody stools this morning.  Sodium low at 131 but trending up Potassium WNL Magnesium is now WNL Phosphorus is low at 1.7 Meds of note include: ferrous sulfate, folic acid, creon, IV mag sulfate, MVI, thiamine, vitamin B-12, K phos  NS at 75 ml/hr  Height: Ht Readings from Last 1 Encounters:  08/15/13 5\' 1"  (1.549 m)    Weight: Wt Readings from Last 1 Encounters:  08/15/13 82 lb 10.8 oz (37.5 kg)  No new weight  BMI:  15.6 - underweight  Estimated Nutritional Needs: Kcal: 1550 - 1700 Protein: at least 57  g daily Fluid: 1.3 - 1.6 liters daily  Skin: intact  Diet Order: General   Intake/Output Summary (Last 24 hours) at 08/19/13 0910 Last data filed at 08/18/13 2111  Gross per 24 hour  Intake    370 ml  Output   1200 ml  Net   -830 ml    Last BM: 3/16  Labs:   Recent Labs Lab 08/17/13 0550  08/18/13 0935 08/18/13 1904 08/19/13 0632  NA 133*  < > 130* 129* 131*  K 4.3  < > 5.2 4.8 5.1  CL 103  < > 101 96 98  CO2 16*  < > 13* 14* 18*  BUN 18  < > 21 20 22   CREATININE 1.46*  < > 1.43* 1.60* 1.93*  CALCIUM 7.5*  < > 7.5* 7.3* 7.2*  MG 1.3*  < > 1.4* 2.9* 2.1  PHOS 1.6*  --  1.2*  --  1.7*  GLUCOSE 109*  < > 128* 165* 109*  < > = values in this interval not displayed.  CBG (last 3)  No results found for this basename: GLUCAP,  in the last 72 hours  Scheduled Meds: . calcium carbonate  6 tablet Oral TID  . feeding supplement (ENSURE COMPLETE)  237 mL Oral BID BM  . ferrous sulfate  325 mg Oral BID WC  . FLUoxetine  10 mg Oral Daily  . folic acid  1 mg Oral Daily  . gabapentin  600 mg Oral TID  . lipase/protease/amylase  1 capsule Oral TID AC  . LORazepam  0-4 mg Intravenous Q12H  . magnesium oxide  400 mg Oral QID  . multivitamin with minerals  1  tablet Oral Daily  . pantoprazole  40 mg Oral BID  . phosphorus  250 mg Oral QID  . piperacillin-tazobactam (ZOSYN)  IV  3.375 g Intravenous 3 times per day  . sodium chloride  3 mL Intravenous Q12H  . sodium chloride  3 mL Intravenous Q12H  . thiamine  100 mg Oral Daily   Or  . thiamine  100 mg Intravenous Daily  . vancomycin  500 mg Intravenous Q24H  . vitamin B-12  250 mcg Oral QPM    Continuous Infusions:  Inda Coke MS, RD, LDN Inpatient Registered Dietitian Pager: [phone] After-hours pager: [phone]

## 2013-08-19 NOTE — Progress Notes (Signed)
Pt's temp 101.2 - tylenol admin. Extra blankets removed from pt, and room temperature decreased. Pt's temp 102.2 1 hour after tylenol - MD notified. MD state will notify dayshift MD regarding temp, Hgb, bloody stools, and tachycardia. Pt in no s/s of distress. Will continue to monitor.

## 2013-08-19 NOTE — Progress Notes (Signed)
Subjective: Patient seen and examined at the bedside this morning. Overnight she had a large, soft stool with dark red blood. This was FOBT positive. She had another bloody stool this morning. She also tells me she had vomiting x1 overnight, yellow with dark clots. She still says she feels "fine" but on further questioning endorses fatigue, some shortness of breath. She admits to "hearing her family" in the room when dreaming. Denies anxiety, tremor.  Review of telemetry shows sinus tachycardia. She spiked another fever overnight and blood cultures were repeated. Fever came down with tylenol.  Objective: Vital signs in last 24 hours: Filed Vitals:   08/19/13 0455 08/19/13 0604 08/19/13 0645 08/19/13 0828  BP: 97/66   91/58  Pulse: 132  124 114  Temp: 101.2 F (38.4 C) 102.2 F (39 C)  98.5 F (36.9 C)  TempSrc: Oral Oral  Oral  Resp: 18   18  Height:      Weight:      SpO2: 91%  100%    Weight change:   Intake/Output Summary (Last 24 hours) at 08/19/13 1053 Last data filed at 08/18/13 2111  Gross per 24 hour  Intake    370 ml  Output    800 ml  Net   -430 ml   Vitals reviewed. General: resting in bed, in NAD, appears malnourished, calm, nasal cannula oxygen @2L  in place, she has no IV and tells me it was lost this am HEENT: no scleral icterus, dry mucous membranes Cardiac: tachycardia, no rubs, murmurs or gallops Pulm: Bibasilar rales with R greater than L, no rhonchi or wheezing Abd: soft, some tenderness over suprapubic area to deep palpation, nondistended, BS present, bandage in RLQ over an oozing site where her subq heparin was given Ext: warm and well perfused, no pedal edema Neuro: alert and oriented X3, moves all extremities voluntarily, no tremors or asterixis  Lab Results: Basic Metabolic Panel:  Recent Labs Lab 08/18/13 0935 08/18/13 1904 08/19/13 0632  NA 130* 129* 131*  K 5.2 4.8 5.1  CL 101 96 98  CO2 13* 14* 18*  GLUCOSE 128* 165* 109*  BUN 21 20  22   CREATININE 1.43* 1.60* 1.93*  CALCIUM 7.5* 7.3* 7.2*  MG 1.4* 2.9* 2.1  PHOS 1.2*  --  1.7*   Liver Function Tests:  Recent Labs Lab 08/18/13 1904 08/19/13 0632  AST 46* 38*  ALT 14 12  ALKPHOS 303* 255*  BILITOT 0.8 0.9  PROT 6.7 5.9*  ALBUMIN 1.5* 1.5*    Recent Labs Lab 08/15/13 1038  LIPASE 8*   CBC:  Recent Labs Lab 08/15/13 1038  08/18/13 1904 08/19/13 0632  WBC 6.3  < > 11.2* 14.3*  NEUTROABS 3.7  --   --   --   HGB 8.0*  < > 7.0* 5.0*  HCT 23.3*  < > 20.5* 14.4*  MCV 98.7  < > 100.0 97.3  PLT 148*  < > 124* 117*  < > = values in this interval not displayed. Urine Drug Screen: Drugs of Abuse     Component Value Date/Time   LABOPIA NONE DETECTED 08/16/2013 0041   LABOPIA NEG 09/18/2011 0936   COCAINSCRNUR NONE DETECTED 08/16/2013 0041   COCAINSCRNUR NEG 09/18/2011 0936   LABBENZ NONE DETECTED 08/16/2013 0041   LABBENZ NEG 09/18/2011 0936   LABBENZ NEG 04/10/2011 1130   AMPHETMU NONE DETECTED 08/16/2013 0041   AMPHETMU NEG 04/10/2011 1130   THCU NONE DETECTED 08/16/2013 0041   LABBARB NONE DETECTED  08/16/2013 0041   LABBARB NEG 09/18/2011 0936    Alcohol Level:  Recent Labs Lab 08/14/13 0958 08/15/13 1038  ETH 244* 169*   Urinalysis:  Recent Labs Lab 08/16/13 0041  COLORURINE YELLOW  LABSPEC 1.013  PHURINE 5.5  GLUCOSEU NEGATIVE  HGBUR NEGATIVE  BILIRUBINUR NEGATIVE  KETONESUR NEGATIVE  PROTEINUR 100*  UROBILINOGEN 0.2  NITRITE NEGATIVE  LEUKOCYTESUR NEGATIVE    Micro Results: Recent Results (from the past 240 hour(s))  URINE CULTURE     Status: None   Collection Time    08/16/13 12:41 AM      Result Value Ref Range Status   Specimen Description URINE, RANDOM   Final   Special Requests ADD [id number] [phone]   Final   Culture  Setup Time     Final   Value: 08/16/2013 14:10     Performed at [place]     Final   Value: >=100,000 COLONIES/ML     Performed at [facility]   Culture     Final   Value:  ESCHERICHIA COLI     Performed at [facility]   Report Status 08/18/2013 FINAL   Final   Organism ID, Bacteria ESCHERICHIA COLI   Final  CULTURE, BLOOD (ROUTINE X 2)     Status: None   Collection Time    08/16/13  2:20 AM      Result Value Ref Range Status   Specimen Description BLOOD LEFT HAND   Final   Special Requests BOTTLES DRAWN AEROBIC ONLY 10 CC   Final   Culture  Setup Time     Final   Value: 08/16/2013 12:16     Performed at [facility]   Culture     Final   Value:        BLOOD CULTURE RECEIVED NO GROWTH TO DATE CULTURE WILL BE HELD FOR 5 DAYS BEFORE ISSUING A FINAL NEGATIVE REPORT     Performed at [facility]   Report Status PENDING   Incomplete  CULTURE, BLOOD (ROUTINE X 2)     Status: None   Collection Time    08/16/13  2:24 AM      Result Value Ref Range Status   Specimen Description BLOOD LEFT HAND   Final   Special Requests BOTTLES DRAWN AEROBIC ONLY 10 CC   Final   Culture  Setup Time     Final   Value: 08/16/2013 12:16     Performed at [facility]   Culture     Final   Value:        BLOOD CULTURE RECEIVED NO GROWTH TO DATE CULTURE WILL BE HELD FOR 5 DAYS BEFORE ISSUING A FINAL NEGATIVE REPORT     Performed at [facility]   Report Status PENDING   Incomplete  CLOSTRIDIUM DIFFICILE BY PCR     Status: None   Collection Time    08/19/13  4:25 AM      Result Value Ref Range Status   C difficile by pcr NEGATIVE  NEGATIVE Final   Studies/Results: No results found. Medications: I have reviewed the patient's current medications. Scheduled Meds: . calcium carbonate  6 tablet Oral TID  . feeding supplement (ENSURE COMPLETE)  237 mL Oral TID BM  . ferrous sulfate  325 mg Oral BID WC  . FLUoxetine  10 mg Oral Daily  . folic acid  1 mg Oral Daily  . gabapentin  600 mg  Oral TID  . lipase/protease/amylase  1 capsule Oral TID AC  . LORazepam  0-4 mg Intravenous Q12H  . magnesium oxide  400 mg Oral QID  . multivitamin with  minerals  1 tablet Oral Daily  . pantoprazole (PROTONIX) IV  40 mg Intravenous Q12H  . phosphorus  250 mg Oral QID  . piperacillin-tazobactam (ZOSYN)  IV  3.375 g Intravenous 3 times per day  . sodium chloride  3 mL Intravenous Q12H  . sodium chloride  3 mL Intravenous Q12H  . thiamine  100 mg Oral Daily   Or  . thiamine  100 mg Intravenous Daily  . vancomycin  500 mg Intravenous Q24H  . vitamin B-12  250 mcg Oral QPM   Continuous Infusions: . sodium chloride     PRN Meds:.sodium chloride, acetaminophen, promethazine, promethazine, sodium chloride  Assessment/Plan: 58 year old female with a history of EtOH abuse, chronic pancreatitis, protein calorie malnutrition, hypocalcemia and hypomagnesemia, HTN, CKD3 who presents to the ED because she was instructed to do so by the [facility] for low magnesium, found to have a pneumonia and sepsis.   #Acute GI bleed - Maroon BMs overnight and hemoptysis x1. Hemoglobin 8.1 > 5.0 since yesterday. EGD from 6/14 did NOT show varices, but was positive for Mallory-Weiss tear. 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 Suspect UGIB. Etiology could be Mallory-Weiss tear, bleeding esophageal varices, bleeding diathesis perhaps due to vitamin K deficiency. Platelets are 117, which is her baseline. - Transfer to step down unit - GI consult, Dr. Benson Norway - PCCM consult for cental line placement (lost IV, IV team could not replace) - Transfuse 3 units PRBCs - IVF NS 500cc bolus, followed by continuous NS @75cc /hr - NPO for possible EGD/coloscopy - IV Protonix 40mg  BID - Holding heparin and ASA - Place and maintain SCDs - Monitor CBC q6h, transfusion threshold <7, type and screen has already been performed - PT/INR stat, can give IV K if needed  Hemoglobin  Date Value Ref Range Status  08/19/2013 5.0* 12.0 - 15.0 g/dL Final     REPEATED TO VERIFY     CRITICAL RESULT CALLED TO, READ  BACK BY AND VERIFIED WITH:     Etheleen Mayhew RN [id number] 08/19/2013 BY MACEDA, J  08/18/2013 7.0* 12.0 - 15.0 g/dL Final  08/18/2013 8.1* 12.0 - 15.0 g/dL Final  08/17/2013 7.3* 12.0 - 15.0 g/dL Final  08/17/2013 6.9* 12.0 - 15.0 g/dL Final     REPEATED TO VERIFY     CRITICAL RESULT CALLED TO, READ BACK BY AND VERIFIED WITH:     SCHESCL,L RN 08/17/13 [place]    #Severe sepsis - Still with fever, Tmax 102.2 overnight. Still with tachycardia to 124. Lactate down trending but still elevated at 2.0. BP 90-110s/50-80s. Likely source is pneumonia +/- UTI. Blood cultures are NGTD, they were repeated last night. - IVF as above - Vanc and zosyn per pharmacy, to cover for MRSA and anaerobes given risk of aspiration  - If spikes a fever again, will repeat blood cultures - Tylenol per rectum if needed  #Multifocal pneumonia, CAP vs. aspiration - CT chest on 3/14 showed ground-glass opacities bilaterally and an area of patchy consolidative changes in the RLL concerning for a multifocal infectious/inflammatory process. She is an alcoholic and has a history of aspiration pneumonia. O2 sats dropped to 86 on room air last night, she is now satting 100 on 2L [place]. She  still endorses a nonproductive cough. Leukocytosis today to 14.3. - Treatment as above with Vanc and Zosyn  - Continuous pulse ox - CBC daily  #UTI - Complained of urinary frequency on admission. UA with many bacteria, rare squams. Urine culture is growing >100K col/ml of E. Coli, pan sensitive (including to Zosyn). - Continue antibiotics as above  #Hypomagnesemia - Mag 2.1 this am. - NPO given GI bleed - Continue to monitor BMP, mag BID with repletion as needed  Magnesium  Date Value Ref Range Status  08/19/2013 2.1  1.5 - 2.5 mg/dL Final  08/18/2013 2.9* 1.5 - 2.5 mg/dL Final  08/18/2013 1.4* 1.5 - 2.5 mg/dL Final  08/17/2013 1.6  1.5 - 2.5 mg/dL Final  08/17/2013 1.3* 1.5 - 2.5 mg/dL Final    #Hypophosphatemia - We are monitoring daily  phosphorus due to risk of refeeding syndrome. Today it is 1.7. We were giving oral K phos neutral, but she is now NPO. Given risks of IV phos we will hold off on giving this unless symptomatic or severely deficient. - Continue to monitor phos daily  Phosphorus  Date Value Ref Range Status  08/19/2013 1.7* 2.3 - 4.6 mg/dL Final  08/18/2013 1.2* 2.3 - 4.6 mg/dL Final  08/17/2013 1.6* 2.3 - 4.6 mg/dL Final  08/16/2013 3.9  2.3 - 4.6 mg/dL Final  08/15/2013 3.5  2.3 - 4.6 mg/dL Final    #Acute on chronic normocytic anemia - Baseline hemoglobin 8.8-10.7. She has a history of B12 deficiency and folate deficiency causing macrocytic anemia. She is also on iron supplementation for a history of iron deficiency anemia. See GI bleed above. 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  - Holding home calcium carbonate 500mg  6 tabs (1200 mg elemental calcium) 3 times a day given NPO  #Alcohol abuse - CIWA 0 overnight, but she is telling us she hears voices in her sleep. She remains tachycardic with intermittent fevers. - Continue CIWA protocol  - Folate, thiamine, multivitamin as above (holding as NPO) - Will not schedule Ativan as she appears to be quite sensitive to it  #Severe protein calorie malnutrition - Albumin 1.9, now 1.5 on presentation. She admits to most of her daily po intake being "water and wine."  - Appreciate nutrition recs  - NPO for now but once able to eat, will give regular, liberalized diet  - Holding Ensure feeding supplements twice a day between meals while NPO - Will refer patient to outpatient Internal Medicine RD at discharge  #CKD3 - Baseline Cr 1.2-1.3 Trending up. She appears dry on exam, suspect a pre-renal process. - IVF  as above - Continue to monitor   Creatinine, Ser  Date Value Ref Range Status  08/19/2013 1.93* 0.50 - 1.10 mg/dL Final  08/18/2013 1.60* 0.50 - 1.10 mg/dL Final  08/18/2013 1.43* 0.50 - 1.10 mg/dL Final  08/17/2013 1.40* 0.50 - 1.10 mg/dL Final  08/17/2013 1.46* 0.50 - 1.10 mg/dL Final    #Chronic anion gap metabolic acidosis - AG 15, stable. ABG below from 3/14 shows primary metabolic acidosis, with increased anion gap,with full respiratory compensation, likely 2/2 lactic acidosis. UA negative for ketones.  - Treating sepsis as above  - Will repeat ABG if respiratory status compromise  ABG    Component  Value  Date/Time    PHART  7.324*  08/16/2013 0940    PCO2ART  29.5*  08/16/2013 0940    PO2ART  73.8*  08/16/2013 0940    HCO3  14.9*  08/16/2013 0940    TCO2  15.7  08/16/2013 0940    ACIDBASEDEF  9.9*  08/16/2013 0940    O2SAT  93.2  08/16/2013 0940    #Chronic pancreatitis - Stable. Patient denies nausea, vomiting, diarrhea.  - Holding home Creon while NPO  #Depression - Apathy and blunted affect noted on exam.  - Holding Prozac 10 mg daily while NPO  #HTN - BP 91/58. - IVF and blood products as above - Holding home amlodipine 5 mg daily  - Holding home Lasix 20 mg daily  #Tachycardia - A chronic issue per chart review, but now with fever, lactic acidosis, hypotension. - Resuscitation as above - Continue to monitor on telemetry   #GERD - PPI as above.  #Neuropathic pain/anxiety - Holding home gabapentin 600 mg 3 times a day while NPO  #DVT PPX - SCDs  Dispo: Disposition is deferred at this time, awaiting improvement of current medical problems.  Anticipated discharge in approximately 1-2 day(s).   The patient does have a current PCP Otho Bellows, MD) and does need an [facility] hospital follow-up appointment after discharge.  The patient does not have transportation limitations that hinder transportation to clinic appointments.  .Services Needed at time of discharge: Y =  Yes, Blank = No PT:   OT:   RN:   Equipment:   Other:     LOS: 4 days   Lesly Dukes, MD 08/19/2013, 10:53 AM  Lesly Dukes, MD  [email or web address] Pager # [phone] Office # [phone]

## 2013-08-19 NOTE — Progress Notes (Signed)
[facility] for :  Vancomycin, Zosyn Indication:  Severe Sepsis, PNA, UTI  Hospital Problems Principal Problem:   Sepsis Active Problems:   ALCOHOL ABUSE   Failure to thrive in childhood   Severe protein-calorie malnutrition   Hypomagnesemia   Pneumonia   Dosing Weight: 37.5 kg  Currently:  98.8 F (37.1 C) (Oral) ,  Lab Results  Component Value Date   WBC 14.7* 08/19/2013    Labs:  Recent Labs  08/18/13 0935 08/18/13 1053 08/18/13 1904 08/19/13 0632 08/19/13 1130  WBC 9.0  --  11.2* 14.3* 14.7*  HGB 8.1*  --  7.0* 5.0* 5.1*  PLT 123*  --  124* 117* 115*  LABCREA  --  25.13  --   --   --   CREATININE 1.43*  --  1.60* 1.93*  --     Estimated Creatinine Clearance: 19.3 ml/min (by C-G formula based on Cr of 1.93).   Microbiology: Recent Results (from the past 720 hour(s))  URINE CULTURE     Status: None   Collection Time    08/16/13 12:41 AM      Result Value Ref Range Status   Specimen Description URINE, RANDOM   Final   Special Requests ADD [id number]   Final   Culture  Setup Time     Final   Value: 08/16/2013 14:10     Performed at [place]     Final   Value: >=100,000 COLONIES/ML     Performed at [facility]   Culture     Final   Value: ESCHERICHIA COLI     Performed at [facility]   Report Status 08/18/2013 FINAL   Final   Organism ID, Bacteria ESCHERICHIA COLI   Final  CULTURE, BLOOD (ROUTINE X 2)     Status: None   Collection Time    08/16/13  2:20 AM      Result Value Ref Range Status   Specimen Description BLOOD LEFT HAND   Final   Special Requests BOTTLES DRAWN AEROBIC ONLY 10 CC   Final   Culture  Setup Time     Final   Value: 08/16/2013 12:16     Performed at [facility]   Culture     Final   Value:        BLOOD CULTURE RECEIVED NO GROWTH TO DATE CULTURE WILL BE HELD FOR 5 DAYS BEFORE ISSUING A FINAL NEGATIVE REPORT     Performed at [facility]   Report Status PENDING   Incomplete  CULTURE, BLOOD (ROUTINE X 2)     Status: None   Collection Time    08/16/13  2:24 AM      Result Value Ref Range Status   Specimen Description BLOOD LEFT HAND   Final   Special Requests BOTTLES DRAWN AEROBIC ONLY 10 CC   Final   Culture  Setup Time     Final   Value: 08/16/2013 12:16     Performed at [facility]   Culture     Final   Value:        BLOOD CULTURE RECEIVED NO GROWTH TO DATE CULTURE WILL BE HELD FOR 5 DAYS BEFORE ISSUING A FINAL NEGATIVE REPORT     Performed at [facility]   Report Status PENDING   Incomplete  CLOSTRIDIUM DIFFICILE BY PCR     Status: None   Collection Time  08/19/13  4:25 AM      Result Value Ref Range Status   C difficile by pcr NEGATIVE  NEGATIVE Final    Current Medication[s] Include:  Scheduled:  Scheduled:  . calcium carbonate  6 tablet Oral TID  . feeding supplement (ENSURE COMPLETE)  237 mL Oral TID BM  . ferrous sulfate  325 mg Oral BID WC  . FLUoxetine  10 mg Oral Daily  . folic acid  1 mg Oral Daily  . gabapentin  600 mg Oral TID  . lipase/protease/amylase  1 capsule Oral TID AC  . magnesium oxide  400 mg Oral QID  . multivitamin with minerals  1 tablet Oral Daily  . pantoprazole (PROTONIX) IV  40 mg Intravenous Q12H  . phosphorus  250 mg Oral QID  . piperacillin-tazobactam (ZOSYN)  IV  3.375 g Intravenous 3 times per day  . sodium chloride  3 mL Intravenous Q12H  . sodium chloride  3 mL Intravenous Q12H  . thiamine  100 mg Oral Daily   Or  . thiamine  100 mg Intravenous Daily  . vancomycin  500 mg Intravenous Q24H  . vitamin B-12  250 mcg Oral QPM    Infusion[s]: Infusions:  . sodium chloride 1,000 mL (08/19/13 1422)    Antibiotic[s]: Anti-infectives   Start     Dose/Rate Route Frequency Ordered Stop   08/16/13 1400  piperacillin-tazobactam (ZOSYN) IVPB 3.375 g     3.375 g 12.5 mL/hr over 240 Minutes Intravenous 3 times per day 08/16/13 1223      08/16/13 1300  vancomycin (VANCOCIN) 500 mg in sodium chloride 0.9 % 100 mL IVPB     500 mg 100 mL/hr over 60 Minutes Intravenous Every 24 hours 08/16/13 1223     08/16/13 1200  cephALEXin (KEFLEX) capsule 500 mg  Status:  Discontinued     500 mg Oral Every 12 hours 08/16/13 1132 08/16/13 1201      Assessment:  58 y/o female on Day # 4 of Vancomycin and Zosyn for Severe Sepsis, CAP vs Asp PNA and E.Coli UTI.  Patient has clinically deteriorated with + GI bleed with significant drop in Hgb overnight.  Tm 102.2.  Rising WBC's.  Renal function worsening with Scr up to 1.9 and estimated CrCl to < 20 ml/min.  Vancomycin trough resulted,  14 mcg/ml.   This is slightly below our goal for Sepsis and PNA but given the somewhat rapid renal worsening, no change in Vancomycin will be made.  [ Vancomycin accumulation would hasten renal function decline ].  I expect that the Vancomycin levels will gradually rise within our therapeutic range on the same dose.  Goal of Therapy:   Vancomycin trough level 15-20 mcg/ml Zosyn selected for infection/cultures and adjusted for renal function.  Follow up cultures, clinical course, fever curve, and adjust as clinically indicated.  Plan:  1. Continue Vancomycin 500 mg IV q 24 hours.   2. Change Zosyn to 2.25 gm IV q 6 hours. 3. Repeat Vancomycin [random] level in 2 - 3 days pending continued change in renal function.  Estelle June, Pharm.D.  08/19/2013 2:39 PM

## 2013-08-19 NOTE — Progress Notes (Signed)
  I have seen and examined the patient, and reviewed the daily progress note by Conni Elliot, MS 3 and discussed the care of the patient with them. Please see my progress note from 08/19/2013 for further details regarding assessment and plan.    Signed:  Lesly Dukes, MD 08/19/2013, 12:02 PM

## 2013-08-19 NOTE — Procedures (Signed)
Central Venous Catheter Insertion Procedure Note [name] [name] [id number] July 22, 1955  Procedure: Insertion of Central Venous Catheter Indications: Assessment of intravascular volume  Procedure Details Consent: Risks of procedure as well as the alternatives and risks of each were explained to the (patient/caregiver).  Consent for procedure obtained. Time Out: Verified patient identification, verified procedure, site/side was marked, verified correct patient position, special equipment/implants available, medications/allergies/relevent history reviewed, required imaging and test results available.  Performed  Maximum sterile technique was used including antiseptics, cap, gloves, gown, hand hygiene, mask and sheet. Skin prep: Chlorhexidine; local anesthetic administered A antimicrobial bonded/coated triple lumen catheter was placed in the right internal jugular vein using the Seldinger technique.  Evaluation Blood flow good Complications: No apparent complications Patient did tolerate procedure well. Chest X-ray ordered to verify placement.  CXR: pending.  Placed with ultrasound guidance by Georgann Housekeeper, ACNP.  I was present for procedure.  Chesley Mires, MD [facility] Pulmonary/Critical Care 08/19/2013, 12:31 PM Pager:  [phone] After 3pm call: [phone]

## 2013-08-19 NOTE — Consult Note (Signed)
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  During the admission she was noted to be febrile up to 102 and she felt be septic.  Over the interval time period she was also started on heparin for DVT prophylaxis and there was evidence of oozing at the sites of injection.  Yesterday she started to have some bloody bowel movements and she also reports hemoptysis.  She was very clear that she did not have hematemesis.  No reports of any abdominal pain.  Past Medical History  Diagnosis Date  . Anemia, B12 deficiency   . History of acute pancreatitis   . Right knee pain     No recent imaging on chart  . 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 Dr. Mancel Bale, [facility]  . Hypertriglyceridemia   . GERD (gastroesophageal reflux disease)   . Vitamin D deficiency   . Subdural hematoma 02/2008    Likely 2/2 trauma from seizure from EtOH withdrawal, chronic in nature, sees Dr. Jerene Bears. Most recent CT head 10/2009 showing stable but persistent hematoma without mass effect.  . History of seizure disorder     Likely 2/2 alcohol abuse  . Hypocalcemia   . Hypomagnesemia   . Failure to thrive in childhood     Unclear etiology  . HTN (hypertension)   . Thrombocytopenia   . Anemia, macrocytic   . Hepatomegaly     On exam  . Joint pain   . Alcohol abuse   . Vitamin D deficiency   . Menopause   . Pancreatitis   . Insomnia   . Hyperlipidemia   . Sinusitis   . Pernicious anemia   . Macrocytic anemia   . 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 Depression   . Fx humeral neck 04/17/2011    Transverse fracture- minimally displaced- managed as outpatient   . ABNORMAL PAP  SMEAR, LGSIL 07/23/2008    Annotation: HPV positive CIN I Dr. Mancel Bale, [facility] Qualifier: Diagnosis of  By: Oretha Ellis    . Pneumonia 05/20/2012  . Seizures     "last one was in 2013; don't know what kind" (08/15/2013)  . Arthritis     "shoulders" (08/15/2013)    Past Surgical History  Procedure Laterality Date  . Cesarean section  1983  . Esophagogastroduodenoscopy  07/11/2011    Procedure: ESOPHAGOGASTRODUODENOSCOPY (EGD);  Surgeon: Beryle Beams, MD;  Location: [facility];  Service: Endoscopy;  Laterality: N/A;  . Colonoscopy  07/11/2011    Procedure: COLONOSCOPY;  Surgeon: Beryle Beams, MD;  Location: [facility];  Service: Endoscopy;  Laterality: N/A;  . Eye surgery Left     "trauma"  . Right colectomy  08/28/2011  . Esophagogastroduodenoscopy N/A 12/01/2012    Procedure: ESOPHAGOGASTRODUODENOSCOPY (EGD);  Surgeon: Irene Shipper, MD;  Location: [facility];  Service: Endoscopy;  Laterality: N/A;    Family History  Problem Relation Age of Onset  . Cancer Mother     Died from stomach cancer and "flesh eating rash  . Heart failure Father     Died in 36s from an MI  . Alcohol abuse Sister     Twin sister  drinks a lot, as did both her parents and brothers  . Stroke Brother     Has 7 brothers, 1 with CVA    Social History:  reports that she quit smoking about 2 years ago. Her smoking use included Cigarettes. She has a 20 pack-year smoking history. She has never used smokeless tobacco. She reports that she drinks about 12.6 ounces of alcohol per week. She reports that she uses illicit drugs (Marijuana and Cocaine).  Allergies:  Allergies  Allergen Reactions  . Amitriptyline Hcl Swelling    In the face.  . Doxycycline Hyclate Itching    Feels like something crawling under her skin    Medications:  Scheduled: . calcium carbonate  6 tablet Oral TID  . feeding supplement (ENSURE COMPLETE)  237 mL Oral TID BM  . ferrous sulfate  325 mg Oral BID  WC  . FLUoxetine  10 mg Oral Daily  . folic acid  1 mg Oral Daily  . gabapentin  600 mg Oral TID  . lipase/protease/amylase  1 capsule Oral TID AC  . magnesium oxide  400 mg Oral QID  . multivitamin with minerals  1 tablet Oral Daily  . pantoprazole (PROTONIX) IV  40 mg Intravenous Q12H  . phosphorus  250 mg Oral QID  . piperacillin-tazobactam (ZOSYN)  IV  3.375 g Intravenous 3 times per day  . sodium chloride  3 mL Intravenous Q12H  . sodium chloride  3 mL Intravenous Q12H  . thiamine  100 mg Oral Daily   Or  . thiamine  100 mg Intravenous Daily  . vancomycin  500 mg Intravenous Q24H  . vitamin B-12  250 mcg Oral QPM   Continuous: . sodium chloride      Results for orders placed during the hospital encounter of 08/15/13 (from the past 24 hour(s))  COMPREHENSIVE METABOLIC PANEL     Status: Abnormal   Collection Time    08/18/13  7:04 PM      Result Value Ref Range   Sodium 129 (*) 137 - 147 mEq/L   Potassium 4.8  3.7 - 5.3 mEq/L   Chloride 96  96 - 112 mEq/L   CO2 14 (*) 19 - 32 mEq/L   Glucose, Bld 165 (*) 70 - 99 mg/dL   BUN 20  6 - 23 mg/dL   Creatinine, Ser 1.60 (*) 0.50 - 1.10 mg/dL   Calcium 7.3 (*) 8.4 - 10.5 mg/dL   Total Protein 6.7  6.0 - 8.3 g/dL   Albumin 1.5 (*) 3.5 - 5.2 g/dL   AST 46 (*) 0 - 37 U/L   ALT 14  0 - 35 U/L   Alkaline Phosphatase 303 (*) 39 - 117 U/L   Total Bilirubin 0.8  0.3 - 1.2 mg/dL   GFR calc non Af Amer 35 (*) >90 mL/min   GFR calc Af Amer 41 (*) >90 mL/min  MAGNESIUM     Status: Abnormal   Collection Time    08/18/13  7:04 PM      Result Value Ref Range   Magnesium 2.9 (*) 1.5 - 2.5 mg/dL  CBC     Status: Abnormal   Collection Time    08/18/13  7:04 PM      Result Value Ref Range   WBC 11.2 (*) 4.0 - 10.5 K/uL   RBC 2.05 (*) 3.87 - 5.11 MIL/uL   Hemoglobin 7.0 (*) 12.0 - 15.0 g/dL   HCT 20.5 (*) 36.0 - 46.0 %   MCV  100.0  78.0 - 100.0 fL   MCH 34.1 (*) 26.0 - 34.0 pg   MCHC 34.1  30.0 - 36.0 g/dL   RDW 13.6  11.5 - 15.5 %    Platelets 124 (*) 150 - 400 K/uL  CLOSTRIDIUM DIFFICILE BY PCR     Status: None   Collection Time    08/19/13  4:25 AM      Result Value Ref Range   C difficile by pcr NEGATIVE  NEGATIVE  OCCULT BLOOD X 1 CARD TO LAB, STOOL     Status: Abnormal   Collection Time    08/19/13  4:25 AM      Result Value Ref Range   Fecal Occult Bld POSITIVE (*) NEGATIVE  COMPREHENSIVE METABOLIC PANEL     Status: Abnormal   Collection Time    08/19/13  6:32 AM      Result Value Ref Range   Sodium 131 (*) 137 - 147 mEq/L   Potassium 5.1  3.7 - 5.3 mEq/L   Chloride 98  96 - 112 mEq/L   CO2 18 (*) 19 - 32 mEq/L   Glucose, Bld 109 (*) 70 - 99 mg/dL   BUN 22  6 - 23 mg/dL   Creatinine, Ser 1.93 (*) 0.50 - 1.10 mg/dL   Calcium 7.2 (*) 8.4 - 10.5 mg/dL   Total Protein 5.9 (*) 6.0 - 8.3 g/dL   Albumin 1.5 (*) 3.5 - 5.2 g/dL   AST 38 (*) 0 - 37 U/L   ALT 12  0 - 35 U/L   Alkaline Phosphatase 255 (*) 39 - 117 U/L   Total Bilirubin 0.9  0.3 - 1.2 mg/dL   GFR calc non Af Amer 28 (*) >90 mL/min   GFR calc Af Amer 32 (*) >90 mL/min  MAGNESIUM     Status: None   Collection Time    08/19/13  6:32 AM      Result Value Ref Range   Magnesium 2.1  1.5 - 2.5 mg/dL  PHOSPHORUS     Status: Abnormal   Collection Time    08/19/13  6:32 AM      Result Value Ref Range   Phosphorus 1.7 (*) 2.3 - 4.6 mg/dL  LACTIC ACID, PLASMA     Status: None   Collection Time    08/19/13  6:32 AM      Result Value Ref Range   Lactic Acid, Venous 2.0  0.5 - 2.2 mmol/L  CBC     Status: Abnormal   Collection Time    08/19/13  6:32 AM      Result Value Ref Range   WBC 14.3 (*) 4.0 - 10.5 K/uL   RBC 1.48 (*) 3.87 - 5.11 MIL/uL   Hemoglobin 5.0 (*) 12.0 - 15.0 g/dL   HCT 14.4 (*) 36.0 - 46.0 %   MCV 97.3  78.0 - 100.0 fL   MCH 33.8  26.0 - 34.0 pg   MCHC 34.7  30.0 - 36.0 g/dL   RDW 13.6  11.5 - 15.5 %   Platelets 117 (*) 150 - 400 K/uL  PREPARE RBC (CROSSMATCH)     Status: None   Collection Time    08/19/13  8:13 AM       Result Value Ref Range   Order Confirmation ORDER PROCESSED BY BLOOD BANK    PROTIME-INR     Status: Abnormal   Collection Time    08/19/13 11:30 AM      Result Value Ref  Range   Prothrombin Time 16.9 (*) 11.6 - 15.2 seconds   INR 1.41  0.00 - 1.49  CBC     Status: Abnormal   Collection Time    08/19/13 11:30 AM      Result Value Ref Range   WBC 14.7 (*) 4.0 - 10.5 K/uL   RBC 1.47 (*) 3.87 - 5.11 MIL/uL   Hemoglobin 5.1 (*) 12.0 - 15.0 g/dL   HCT 14.6 (*) 36.0 - 46.0 %   MCV 99.3  78.0 - 100.0 fL   MCH 34.7 (*) 26.0 - 34.0 pg   MCHC 34.9  30.0 - 36.0 g/dL   RDW 13.7  11.5 - 15.5 %   Platelets 115 (*) 150 - 400 K/uL     Dg Chest Port 1 View  08/19/2013   CLINICAL DATA:  Evaluate central line placement  EXAM: PORTABLE CHEST - 1 VIEW  COMPARISON:  Chest x-ray of 08/15/2013 and CT chest of 08/16/2013  FINDINGS: There is little change to perhaps slight worsening of airspace disease of the lung bases right greater than left most consistent with and inflammatory or infectious process. A small right effusion cannot be excluded. Mediastinal contours are stable and heart size is stable. A right IJ central venous line is present with the tip overlying the expected SVC -RA junction.  IMPRESSION: 1. Right IJ central venous line tip overlies the expected SVC -RA junction. 2. Probable slight worsening of airspace disease at the bases right greater than left.   Electronically Signed   By: Ivar Drape M.D.   On: 08/19/2013 13:09    ROS:  As stated above in the HPI otherwise negative.  Blood pressure 96/63, pulse 108, temperature 98.7 F (37.1 C), temperature source Oral, resp. rate 20, height 5\' 1"  (1.549 m), weight 82 lb 10.8 oz (37.5 kg), SpO2 98.00%.    PE: Gen: NAD, Alert and Oriented HEENT:  /AT, EOMI Neck: Supple, no LAD Lungs: CTA Bilaterally CV: RRR without M/G/R ABM: Soft, NTND, +BS Ext: No C/C/E Rectal: Maroon stool  Assessment/Plan: 1) Hematochezia 2) Hemoptysis. 3) ETOH  withdrawal. 4) ETOH abuse. 5) Sepsis.   The source of the bleeding is very difficult to discern.  The bleeding is most likely precipitated by the use of heparin, which has been held.  In the past she had bleeding from a Mallory-Weiss tear and I performed an EGD/Colonoscopy last year for iron deficiency anemia.  Hemoptysis is an issue and she may have swallowed the blood, which may explain part or all of the hematochezia.  As a result of the bleeding her HGB dropped down to 5 g/dL.  Plan: 1) Agree with transfusion. 2) Possible repeat EGD/Colonoscopy.  I will reassess her clinically tomorrow.  If I perform the procedures it will be on Thursday barring any clinical decompensation.  Olden Klauer D 08/19/2013, 1:48 PM

## 2013-08-19 NOTE — Progress Notes (Signed)
Subjective: The patient was seen in bed this morning. She was somnolent and aroused slowly to loud voice and touch. Her nasal cannulus was not placed inside her nares. She had auditory hallucinations of her brothers and sisters talking about their cars. Denies AVH but reports hearing her family talk in her dreams. Was febrile again last night with Tmax of 102.52F, currently 98.8. One episode of hemoptysis and vomiting last night, and two blood bowel movements. Endorses a new cough and some shortness of breath. Denies chest pain, heart palpitations, N/V/D.   Chart review shows last EGD from 6/16 positive for mallory-weiss tear, last colonoscopy from 2/13 showed internal and external hemorrhoids, multiple polyps, and a granular tumor that was resected.  Objective: Vital signs in last 24 hours: Filed Vitals:   08/19/13 0455 08/19/13 0604 08/19/13 0645 08/19/13 0828  BP: 97/66   91/58  Pulse: 132  124 114  Temp: 101.2 F (38.4 C) 102.2 F (39 C)  98.5 F (36.9 C)  TempSrc: Oral Oral  Oral  Resp: 18   18  Height:      Weight:      SpO2: 91%  100%    Weight change:   Intake/Output Summary (Last 24 hours) at 08/19/13 0852 Last data filed at 08/18/13 2111  Gross per 24 hour  Intake    606 ml  Output   1200 ml  Net   -594 ml   Vitals reviewed. General: resting in bed, in NAD, appears malnourished HEENT: no scleral icterus, MMM Cardiac: tachycardia, no rubs, murmurs or gallops Pulm: bibasilar rales with R greater than L, no rhonchi or wheezing Abd: mild tenderness to palpation in LUQ, soft , nondistended, BS present Ext: warm and well perfused, no pedal edema Neuro: alert and oriented X3, moves all extremities voluntarily, no tremors or asterixis.   Lab Results: Basic Metabolic Panel:  Recent Labs Lab 08/18/13 0935 08/18/13 1904 08/19/13 0632  NA 130* 129* 131*  K 5.2 4.8 5.1  CL 101 96 98  CO2 13* 14* 18*  GLUCOSE 128* 165* 109*  BUN 21 20 22   CREATININE 1.43* 1.60*  1.93*  CALCIUM 7.5* 7.3* 7.2*  MG 1.4* 2.9* 2.1  PHOS 1.2*  --  1.7*   Liver Function Tests:  Recent Labs Lab 08/18/13 1904 08/19/13 0632  AST 46* 38*  ALT 14 12  ALKPHOS 303* 255*  BILITOT 0.8 0.9  PROT 6.7 5.9*  ALBUMIN 1.5* 1.5*    Recent Labs Lab 08/15/13 1038  LIPASE 8*   CBC:  Recent Labs Lab 08/15/13 1038  08/18/13 1904 08/19/13 0632  WBC 6.3  < > 11.2* 14.3*  NEUTROABS 3.7  --   --   --   HGB 8.0*  < > 7.0* 5.0*  HCT 23.3*  < > 20.5* 14.4*  MCV 98.7  < > 100.0 97.3  PLT 148*  < > 124* 117*  < > = values in this interval not displayed. Urine Drug Screen: Drugs of Abuse     Component Value Date/Time   LABOPIA NONE DETECTED 08/16/2013 0041   LABOPIA NEG 09/18/2011 0936   COCAINSCRNUR NONE DETECTED 08/16/2013 0041   COCAINSCRNUR NEG 09/18/2011 0936   LABBENZ NONE DETECTED 08/16/2013 0041   LABBENZ NEG 09/18/2011 0936   LABBENZ NEG 04/10/2011 1130   AMPHETMU NONE DETECTED 08/16/2013 0041   AMPHETMU NEG 04/10/2011 1130   THCU NONE DETECTED 08/16/2013 0041   LABBARB NONE DETECTED 08/16/2013 0041   LABBARB NEG 09/18/2011 7322  Alcohol Level:  Recent Labs Lab 08/14/13 0958 08/15/13 1038  ETH 244* 169*   Urinalysis:  Recent Labs Lab 08/16/13 0041  COLORURINE YELLOW  LABSPEC 1.013  PHURINE 5.5  GLUCOSEU NEGATIVE  HGBUR NEGATIVE  BILIRUBINUR NEGATIVE  KETONESUR NEGATIVE  PROTEINUR 100*  UROBILINOGEN 0.2  NITRITE NEGATIVE  LEUKOCYTESUR NEGATIVE    Micro Results: Recent Results (from the past 240 hour(s))  URINE CULTURE     Status: None   Collection Time    08/16/13 12:41 AM      Result Value Ref Range Status   Specimen Description URINE, RANDOM   Final   Special Requests ADD [id number] [phone]   Final   Culture  Setup Time     Final   Value: 08/16/2013 14:10     Performed at [place]     Final   Value: >=100,000 COLONIES/ML     Performed at [facility]   Culture     Final   Value: ESCHERICHIA COLI      Performed at [facility]   Report Status 08/18/2013 FINAL   Final   Organism ID, Bacteria ESCHERICHIA COLI   Final  CULTURE, BLOOD (ROUTINE X 2)     Status: None   Collection Time    08/16/13  2:20 AM      Result Value Ref Range Status   Specimen Description BLOOD LEFT HAND   Final   Special Requests BOTTLES DRAWN AEROBIC ONLY 10 CC   Final   Culture  Setup Time     Final   Value: 08/16/2013 12:16     Performed at [facility]   Culture     Final   Value:        BLOOD CULTURE RECEIVED NO GROWTH TO DATE CULTURE WILL BE HELD FOR 5 DAYS BEFORE ISSUING A FINAL NEGATIVE REPORT     Performed at [facility]   Report Status PENDING   Incomplete  CULTURE, BLOOD (ROUTINE X 2)     Status: None   Collection Time    08/16/13  2:24 AM      Result Value Ref Range Status   Specimen Description BLOOD LEFT HAND   Final   Special Requests BOTTLES DRAWN AEROBIC ONLY 10 CC   Final   Culture  Setup Time     Final   Value: 08/16/2013 12:16     Performed at [facility]   Culture     Final   Value:        BLOOD CULTURE RECEIVED NO GROWTH TO DATE CULTURE WILL BE HELD FOR 5 DAYS BEFORE ISSUING A FINAL NEGATIVE REPORT     Performed at [facility]   Report Status PENDING   Incomplete   Studies/Results: No results found. Medications: I have reviewed the patient's current medications. Scheduled Meds: . calcium carbonate  6 tablet Oral TID  . feeding supplement (ENSURE COMPLETE)  237 mL Oral BID BM  . ferrous sulfate  325 mg Oral BID WC  . FLUoxetine  10 mg Oral Daily  . folic acid  1 mg Oral Daily  . gabapentin  600 mg Oral TID  . lipase/protease/amylase  1 capsule Oral TID AC  . LORazepam  0-4 mg Intravenous Q12H  . magnesium oxide  400 mg Oral QID  . multivitamin with minerals  1 tablet Oral Daily  . pantoprazole  40 mg Oral BID  . phosphorus  250 mg Oral  QID  . piperacillin-tazobactam (ZOSYN)  IV  3.375 g Intravenous 3 times per day  . sodium chloride   500 mL Intravenous Once  . sodium chloride  3 mL Intravenous Q12H  . sodium chloride  3 mL Intravenous Q12H  . thiamine  100 mg Oral Daily   Or  . thiamine  100 mg Intravenous Daily  . vancomycin  500 mg Intravenous Q24H  . vitamin B-12  250 mcg Oral QPM   Continuous Infusions: . sodium chloride     PRN Meds:.sodium chloride, acetaminophen, promethazine, promethazine, sodium chloride Assessment/Plan: 58 year old female with new acute GI bleed and  a history of EtOH abuse, chronic pancreatitis, protein calorie malnutrition, hypocalcemia and hypomagnesemia, HTN, CKD3 who presented to the ED because she was instructed to do so by the [facility] for low magnesium.   #Acute GI Bleed - hematochezia x 1, couch productive of blood, two episodes of bloody stool, bled from heparin injection site and was switched to SCD's for prophylaxis - GI Consult, Dr. Benson Norway - Check INR - NPO for possible UGI / Colonoscopy, appreciate GI recommendations.  - CBC Q6H - Transfer to stepdown when possible.  - Insert Central line - Transfuse 3 units pRBCs - Malo Bolus 500cc, then 75cc maintenance fluids  #Severe sepsis - She had fever to 102.2, tachycardia 114, elevated lactate to 3.3. Sources could be UTI and PNA. - IVF NS @75  cc/hr, encouraged oral hydration - Vanc and zosyn per pharmacy, to cover for MRSA and anaerobes given risk of aspiration  - Continuous pulse ox.  #Pneumonia : CAP v aspiration. She was afebrile on presentation with no cough or leucocytosis but she had rales over her RLL on physical exam. Her CXR appearance was consistent with interstitial process but a high resolution CT chest was performed for better evaluation of interstitial lung disease. Her CT high res is highly impressive and consistent with multifocal pneumonia worse in the RLL v pneumonitis. The patient states that sometimes she "swallowed wrong" at home but denies dysphagia or chocking.  - Treatment as above with Vanc and Zosyn     #Hypomagnesemia - Persistent. Mg of 0.8 on presentation, improved with 4g of IV magnesium sulfate. Still 1.6 this morning.  Phosphorus slightly low at 1.6.  and potassium are also within normal limits.  - Continue home mag oxide 400mg  QID  - Continue to monitor BMP, mag BID  - Magnesium sulfate 4g infusion  #Hypophosphatemia Low at 1.2 Only two options are oral KPhos (her K is 5.4) or IV Sodium Phos, which can cause arrhythmias and renal failure. - Give oral KPhos, only 100mEq of K. - Encourage oral intake - Monitor for s/s hyperkalemia - BMET and PO4 tomorrow AM.  #Hypocalcemia in the setting of hypoalbuminemia/polyclonal gammopathy, resolved - Corrected calcium is 8.7 today.  - Continue home calcium carbonate 500mg  6 tabs (1200 mg elemental calcium) 3 times a day   #Alcohol abuse - CIWA 5 this morning for sweating and visual disturbance. UDS negative. No tremors currently but tachycardic but with sepsis.   - CIWA protocol  - Folate, thiamine, multivitamin   #Severe protein calorie malnutrition - Albumin 1.9 on presentation. She admits to most of her daily po intake being "water and wine."  - Appreciate nutrition recs  - Regular, liberalized diet  - Ensure feeding supplements twice a day between meals  - Will refer patient to outpatient Internal Medicine RD   #Normocytic anemia - Baseline hemoglobin 8.8-10.7. She has a history  of B12 deficiency and folate deficiency causing macrocytic anemia. She is also on iron supplementation for a history of iron deficiency anemia. She may have a mixed process here causing normocytic anemia. She is not compliant with her medicines. FOBT negative in the ED.  - Continue home vitamin B12 250 mcg every evening  - Continue home ferrous sulfate 325 mg twice a day with meals  - Folate, thiamine, multivitamin  - Daily CBC   #CKD3 - Baseline Cr 1.2-1.3 Currently 1.93. BUN 18. - Continue to monitor   #Chronic anion gap metabolic acidosis - AG 17,  stable. ABG shows primary metabolic acidosis, with increased anion gap,with full respiratory compensation, likely 2/2 lactic acidosis. Lactate down to 2.4UA negative for ketones.  - Treating sepsis as above  ABG    Component  Value  Date/Time    PHART  7.324*  08/16/2013 0940    PCO2ART  29.5*  08/16/2013 0940    PO2ART  73.8*  08/16/2013 0940    HCO3  14.9*  08/16/2013 0940    TCO2  15.7  08/16/2013 0940    ACIDBASEDEF  9.9*  08/16/2013 0940    O2SAT  93.2  08/16/2013 0940    #Chronic pancreatitis - Stable. Patient denies abdominal pain, nausea, vomiting, diarrhea.  - Continue home Creon   #Depression - Apathy and blunted affect noted on exam.  - Continue home Prozac 10 mg daily   #HTN - Hypotensive this morning with BP of 91/58  - Hold home amlodipine 5 mg daily  - Continue home aspirin 81 mg daily  - Stopping home Lasix 20 mg daily given concern for hypovolemia   #Tachycardia - A chronic issue per chart review, but now with fever. Normotensive.  - Continue to monitor on telemetry   #GERD - Continue home Protonix 40 mg daily   #Neuropathic pain/anxiety - Continue home gabapentin 600 mg 3 times a day   #DVT PPX - subcutaneous heparin  Dispo: Disposition is deferred at this time, awaiting improvement of current medical problems.  Anticipated discharge in approximately 1-2 day(s).   This is a Careers information officer Note.  The care of the patient was discussed with Dr. Lucila Maine and the assessment and plan formulated with their assistance.  Please see their attached note for official documentation of the daily encounter.   LOS: 4 days   Donnelly Angelica, Med Student 08/19/2013, 8:52 AM

## 2013-08-19 NOTE — Progress Notes (Signed)
Pt. Being transferred to 3S 14.  Report called to [place], Therapist, sports.  Will transfer pt. In bed with telemetry with blood infusing.  15 -20 minutes VSS - Blood pressure 108/78, pulse 104, temperature 98.4 F (36.9 C), temperature source Oral, resp. rate 18, height 5\' 1"  (1.549 m), weight 37.5 kg (82 lb 10.8 oz), SpO2 98.00%., O2   @ 2 L.                  Alphonzo Lemmings, RN

## 2013-08-20 LAB — CBC
HCT: 31.8 % — ABNORMAL LOW (ref 36.0–46.0)
HEMATOCRIT: 31.9 % — AB (ref 36.0–46.0)
HEMATOCRIT: 32.1 % — AB (ref 36.0–46.0)
HEMOGLOBIN: 11.1 g/dL — AB (ref 12.0–15.0)
Hemoglobin: 11.2 g/dL — ABNORMAL LOW (ref 12.0–15.0)
Hemoglobin: 11.3 g/dL — ABNORMAL LOW (ref 12.0–15.0)
MCH: 31.5 pg (ref 26.0–34.0)
MCH: 31.2 pg (ref 26.0–34.0)
MCH: 31.6 pg (ref 26.0–34.0)
MCHC: 35.2 g/dL (ref 30.0–36.0)
MCHC: 34.8 g/dL (ref 30.0–36.0)
MCHC: 35.2 g/dL (ref 30.0–36.0)
MCV: 89.3 fL (ref 78.0–100.0)
MCV: 89.6 fL (ref 78.0–100.0)
MCV: 89.7 fL (ref 78.0–100.0)
Platelets: 115 10*3/uL — ABNORMAL LOW (ref 150–400)
Platelets: 123 10*3/uL — ABNORMAL LOW (ref 150–400)
Platelets: 123 10*3/uL — ABNORMAL LOW (ref 150–400)
RBC: 3.56 MIL/uL — AB (ref 3.87–5.11)
RBC: 3.56 MIL/uL — ABNORMAL LOW (ref 3.87–5.11)
RBC: 3.58 MIL/uL — ABNORMAL LOW (ref 3.87–5.11)
RDW: 16.2 % — ABNORMAL HIGH (ref 11.5–15.5)
RDW: 16.8 % — ABNORMAL HIGH (ref 11.5–15.5)
RDW: 16.7 % — AB (ref 11.5–15.5)
WBC: 11.6 10*3/uL — AB (ref 4.0–10.5)
WBC: 10.4 10*3/uL (ref 4.0–10.5)
WBC: 10 10*3/uL (ref 4.0–10.5)

## 2013-08-20 LAB — TYPE AND SCREEN
ABO/RH(D): A POS
ANTIBODY SCREEN: NEGATIVE
UNIT DIVISION: 0
Unit division: 0
Unit division: 0

## 2013-08-20 LAB — BASIC METABOLIC PANEL
BUN: 18 mg/dL (ref 6–23)
CHLORIDE: 100 meq/L (ref 96–112)
CO2: 18 meq/L — AB (ref 19–32)
CREATININE: 1.55 mg/dL — AB (ref 0.50–1.10)
Calcium: 7.3 mg/dL — ABNORMAL LOW (ref 8.4–10.5)
GFR calc Af Amer: 42 mL/min — ABNORMAL LOW (ref >90)
GFR calc non Af Amer: 36 mL/min — ABNORMAL LOW (ref >90)
Glucose, Bld: 88 mg/dL (ref 70–99)
Potassium: 4.5 mEq/L (ref 3.7–5.3)
Sodium: 134 mEq/L — ABNORMAL LOW (ref 137–147)

## 2013-08-20 LAB — PHOSPHORUS: Phosphorus: 3.8 mg/dL (ref 2.3–4.6)

## 2013-08-20 LAB — MAGNESIUM
MAGNESIUM: 2.1 mg/dL (ref 1.5–2.5)
Magnesium: 1.6 mg/dL (ref 1.5–2.5)

## 2013-08-20 LAB — COMPREHENSIVE METABOLIC PANEL
ALBUMIN: 1.4 g/dL — AB (ref 3.5–5.2)
ALT: 11 U/L (ref 0–35)
AST: 36 U/L (ref 0–37)
Alkaline Phosphatase: 230 U/L — ABNORMAL HIGH (ref 39–117)
BUN: 21 mg/dL (ref 6–23)
CALCIUM: 7.2 mg/dL — AB (ref 8.4–10.5)
CO2: 19 mEq/L (ref 19–32)
CREATININE: 1.71 mg/dL — AB (ref 0.50–1.10)
Chloride: 105 mEq/L (ref 96–112)
GFR calc Af Amer: 37 mL/min — ABNORMAL LOW (ref >90)
GFR calc non Af Amer: 32 mL/min — ABNORMAL LOW (ref >90)
Glucose, Bld: 82 mg/dL (ref 70–99)
Potassium: 4.3 mEq/L (ref 3.7–5.3)
SODIUM: 139 meq/L (ref 137–147)
Total Bilirubin: 1.7 mg/dL — ABNORMAL HIGH (ref 0.3–1.2)
Total Protein: 5.8 g/dL — ABNORMAL LOW (ref 6.0–8.3)

## 2013-08-20 LAB — MITOCHONDRIAL ANTIBODIES: Mitochondrial M2 Ab, IgG: 1.09 — ABNORMAL HIGH (ref <0.91)

## 2013-08-20 MED ORDER — SODIUM CHLORIDE 0.9 % IV SOLN: INTRAVENOUS | Status: DC

## 2013-08-20 MED ORDER — MAGNESIUM SULFATE 40 MG/ML IJ SOLN
2.0000 g | Freq: Once | INTRAMUSCULAR | Status: AC
  Administered 2013-08-20: 2 g via INTRAVENOUS
  Filled 2013-08-20: qty 50

## 2013-08-20 MED ORDER — PEG 3350-KCL-NA BICARB-NACL 420 G PO SOLR
4000.0000 mL | Freq: Once | ORAL | Status: AC
  Administered 2013-08-20: 4000 mL via ORAL
  Filled 2013-08-20 (×2): qty 4000

## 2013-08-20 NOTE — Progress Notes (Signed)
  Date: 08/20/2013  Patient name: [name] [name]  Medical record number: [id number]  Date of birth: 01-24-1956   This patient has been seen and the plan of care was discussed with the house staff. Please see their note for complete details. I concur with their findings with the following additions/corrections: Noted fever to 101.2 F. H/H responded more than expected to pRBCs. She has been hemodynamically stable. No further bleeding reported. EGD planned tomorrow. Renal function is slowly improving and she is clinically better. Her WBC is trending down. Cont Vanc/Zosyn for now, she is slowly improving from severe sepsis (PNA likely).  Dominic Pea, DO, [facility] 08/20/2013, 11:37 AM

## 2013-08-20 NOTE — Progress Notes (Signed)
Subjective: No acute events.  No further reports of bleeding.  Objective: Vital signs in last 24 hours: Temp:  [98.4 F (36.9 C)-101.2 F (38.4 C)] 99.3 F (37.4 C) (03/18 0736) Pulse Rate:  [97-114] 105 (03/18 0140) Resp:  [18-27] 20 (03/18 0140) BP: (91-134)/(58-110) 117/75 mmHg (03/18 0140) SpO2:  [96 %-100 %] 100 % (03/18 0140) Last BM Date: 08/19/13  Intake/Output from previous day: 03/17 0701 - 03/18 0700 In: 1247.5 [I.V.:347.5; Blood:800; IV Piggyback:100] Out: 2 [Urine:1; Stool:1] Intake/Output this shift:    General appearance: alert and no distress GI: soft, non-tender; bowel sounds normal; no masses,  no organomegaly  Lab Results:  Recent Labs  08/19/13 0632 08/19/13 1130 08/20/13 0240  WBC 14.3* 14.7* 11.6*  HGB 5.0* 5.1* 11.2*  HCT 14.4* 14.6* 31.8*  PLT 117* 115* 115*   BMET  Recent Labs  08/18/13 1904 08/19/13 0632 08/20/13 0240  NA 129* 131* 139  K 4.8 5.1 4.3  CL 96 98 105  CO2 14* 18* 19  GLUCOSE 165* 109* 82  BUN 20 22 21   CREATININE 1.60* 1.93* 1.71*  CALCIUM 7.3* 7.2* 7.2*   LFT  Recent Labs  08/20/13 0240  PROT 5.8*  ALBUMIN 1.4*  AST 36  ALT 11  ALKPHOS 230*  BILITOT 1.7*   PT/INR  Recent Labs  08/19/13 1130  LABPROT 16.9*  INR 1.41   Hepatitis Panel No results found for this basename: HEPBSAG, HCVAB, HEPAIGM, HEPBIGM,  in the last 72 hours C-Diff No results found for this basename: CDIFFTOX,  in the last 72 hours Fecal Lactopherrin No results found for this basename: FECLLACTOFRN,  in the last 72 hours  Studies/Results: Dg Chest Port 1 View  08/19/2013   CLINICAL DATA:  Evaluate central line placement  EXAM: PORTABLE CHEST - 1 VIEW  COMPARISON:  Chest x-ray of 08/15/2013 and CT chest of 08/16/2013  FINDINGS: There is little change to perhaps slight worsening of airspace disease of the lung bases right greater than left most consistent with and inflammatory or infectious process. A small right effusion cannot be  excluded. Mediastinal contours are stable and heart size is stable. A right IJ central venous line is present with the tip overlying the expected SVC -RA junction.  IMPRESSION: 1. Right IJ central venous line tip overlies the expected SVC -RA junction. 2. Probable slight worsening of airspace disease at the bases right greater than left.   Electronically Signed   By: Ivar Drape M.D.   On: 08/19/2013 13:09    Medications:  Scheduled: . calcium carbonate  6 tablet Oral TID  . feeding supplement (ENSURE COMPLETE)  237 mL Oral TID BM  . ferrous sulfate  325 mg Oral BID WC  . FLUoxetine  10 mg Oral Daily  . folic acid  1 mg Oral Daily  . gabapentin  600 mg Oral TID  . lipase/protease/amylase  1 capsule Oral TID AC  . magnesium oxide  400 mg Oral QID  . multivitamin with minerals  1 tablet Oral Daily  . pantoprazole (PROTONIX) IV  40 mg Intravenous Q12H  . phosphorus  250 mg Oral QID  . piperacillin-tazobactam  2.25 g Intravenous Q6H  . sodium chloride  3 mL Intravenous Q12H  . sodium chloride  3 mL Intravenous Q12H  . thiamine  100 mg Oral Daily   Or  . thiamine  100 mg Intravenous Daily  . vancomycin  500 mg Intravenous Q24H  . vitamin B-12  250 mcg Oral QPM  Continuous: . sodium chloride 75 mL/hr at 08/20/13 0641    Assessment/Plan: 1) GI bleed. 2) Sepsis. 3) ETOH abuse/withdrawal.   The patient is clinically stable.  Blood pressure and labs have improved.  She still has fever.  No further reports of bleeding and her HGB increased more than anticipated with the blood transfusions.  I will pursue an EGD/Colonoscopy tomorrow to work up the bleeding issue.  Plan: 1) EGD/Colonoscopy tomorrow.   LOS: 5 days   [name] [name] D 08/20/2013, 8:11 AM

## 2013-08-20 NOTE — Progress Notes (Signed)
  I have seen and examined the patient, and reviewed the daily progress note by Conni Elliot, MS 3 and discussed the care of the patient with them. Please see my progress note from 08/20/2013 for further details regarding assessment and plan.    Signed:  Lesly Dukes, MD 08/20/2013, 12:42 PM

## 2013-08-20 NOTE — Progress Notes (Signed)
ANTIBIOTIC CONSULT NOTE - FOLLOW UP  Pharmacy Consult for Vancomycin and Zosyn Indication: pneumonia and rule out sepsis  Allergies  Allergen Reactions  . Amitriptyline Hcl Swelling    In the face.  . Doxycycline Hyclate Itching    Feels like something crawling under her skin    Patient Measurements: Height: 5\' 1"  (154.9 cm) Weight: 82 lb 10.8 oz (37.5 kg) IBW/kg (Calculated) : 47.8  Vital Signs: Temp: 98.6 F (37 C) (03/18 1200) Temp src: Oral (03/18 1200) BP: 132/82 mmHg (03/18 1321) Pulse Rate: 106 (03/18 1321) Intake/Output from previous day: 03/17 0701 - 03/18 0700 In: 1247.5 [I.V.:347.5; Blood:800; IV Piggyback:100] Out: 2 [Urine:1; Stool:1] Intake/Output from this shift: Total I/O In: 50 [IV Piggyback:50] Out: -   Labs:  Recent Labs  08/18/13 1053 08/18/13 1904 08/19/13 0632 08/19/13 1130 08/20/13 0240 08/20/13 0800  WBC  --  11.2* 14.3* 14.7* 11.6* 10.4  HGB  --  7.0* 5.0* 5.1* 11.2* 11.1*  PLT  --  124* 117* 115* 115* 123*  LABCREA 25.13  --   --   --   --   --   CREATININE  --  1.60* 1.93*  --  1.71*  --    Estimated Creatinine Clearance: 21.7 ml/min (by C-G formula based on Cr of 1.71).  Recent Labs  08/19/13 1306  [place] 14.0     Microbiology: Recent Results (from the past 720 hour(s))  URINE CULTURE     Status: None   Collection Time    08/16/13 12:41 AM      Result Value Ref Range Status   Specimen Description URINE, RANDOM   Final   Special Requests ADD [id number] [phone]   Final   Culture  Setup Time     Final   Value: 08/16/2013 14:10     Performed at [facility]     Final   Value: >=100,000 COLONIES/ML     Performed at [facility]   Culture     Final   Value: ESCHERICHIA COLI     Performed at [facility]   Report Status 08/18/2013 FINAL   Final   Organism ID, Bacteria ESCHERICHIA COLI   Final  CULTURE, BLOOD (ROUTINE X 2)     Status: None   Collection Time    08/16/13  2:20 AM   Result Value Ref Range Status   Specimen Description BLOOD LEFT HAND   Final   Special Requests BOTTLES DRAWN AEROBIC ONLY 10 CC   Final   Culture  Setup Time     Final   Value: 08/16/2013 12:16     Performed at [facility]   Culture     Final   Value:        BLOOD CULTURE RECEIVED NO GROWTH TO DATE CULTURE WILL BE HELD FOR 5 DAYS BEFORE ISSUING A FINAL NEGATIVE REPORT     Performed at [facility]   Report Status PENDING   Incomplete  CULTURE, BLOOD (ROUTINE X 2)     Status: None   Collection Time    08/16/13  2:24 AM      Result Value Ref Range Status   Specimen Description BLOOD LEFT HAND   Final   Special Requests BOTTLES DRAWN AEROBIC ONLY 10 CC   Final   Culture  Setup Time     Final   Value: 08/16/2013 12:16     Performed at [facility]  Final   Value:        BLOOD CULTURE RECEIVED NO GROWTH TO DATE CULTURE WILL BE HELD FOR 5 DAYS BEFORE ISSUING A FINAL NEGATIVE REPORT     Performed at [facility]   Report Status PENDING   Incomplete  CLOSTRIDIUM DIFFICILE BY PCR     Status: None   Collection Time    08/19/13  4:25 AM      Result Value Ref Range Status   C difficile by pcr NEGATIVE  NEGATIVE Final  CULTURE, BLOOD (ROUTINE X 2)     Status: None   Collection Time    08/19/13  8:26 AM      Result Value Ref Range Status   Specimen Description BLOOD RIGHT HAND   Final   Special Requests BOTTLES DRAWN AEROBIC ONLY 5CC   Final   Culture  Setup Time     Final   Value: 08/19/2013 12:41     Performed at [facility]   Culture     Final   Value:        BLOOD CULTURE RECEIVED NO GROWTH TO DATE CULTURE WILL BE HELD FOR 5 DAYS BEFORE ISSUING A FINAL NEGATIVE REPORT     Performed at [facility]   Report Status PENDING   Incomplete  CULTURE, BLOOD (ROUTINE X 2)     Status: None   Collection Time    08/19/13  8:32 AM      Result Value Ref Range Status   Specimen Description BLOOD RIGHT HAND   Final   Special Requests  BOTTLES DRAWN AEROBIC ONLY [facility]   Final   Culture  Setup Time     Final   Value: 08/19/2013 12:41     Performed at [facility]   Culture     Final   Value:        BLOOD CULTURE RECEIVED NO GROWTH TO DATE CULTURE WILL BE HELD FOR 5 DAYS BEFORE ISSUING A FINAL NEGATIVE REPORT     Performed at [facility]   Report Status PENDING   Incomplete  MRSA PCR SCREENING     Status: None   Collection Time    08/19/13  4:00 PM      Result Value Ref Range Status   MRSA by PCR NEGATIVE  NEGATIVE Final   Comment:            The GeneXpert MRSA Assay (FDA     approved for NASAL specimens     only), is one component of a     comprehensive MRSA colonization     surveillance program. It is not     intended to diagnose MRSA     infection nor to guide or     monitor treatment for     MRSA infections.    Anti-infectives   Start     Dose/Rate Route Frequency Ordered Stop   08/19/13 2200  piperacillin-tazobactam (ZOSYN) IVPB 2.25 g     2.25 g 100 mL/hr over 30 Minutes Intravenous Every 6 hours 08/19/13 1452     08/16/13 1400  piperacillin-tazobactam (ZOSYN) IVPB 3.375 g  Status:  Discontinued     3.375 g 12.5 mL/hr over 240 Minutes Intravenous 3 times per day 08/16/13 1223 08/19/13 1451   08/16/13 1300  vancomycin (VANCOCIN) 500 mg in sodium chloride 0.9 % 100 mL IVPB     500 mg 100 mL/hr over 60 Minutes Intravenous Every 24 hours 08/16/13 1223  08/16/13 1200  cephALEXin (KEFLEX) capsule 500 mg  Status:  Discontinued     500 mg Oral Every 12 hours 08/16/13 1132 08/16/13 1201      Assessment: 58 year old female on Day #5 of Vancomycin and Zosyn empirically for sepsis, pneumonia, and E. coli UTI.  She had an increase in her creatinine that has stabilized, and her Vancomycin trough level was within the therapeutic range on 3/17.  Her Zosyn regimen has been adjusted for her renal function.  Goal of Therapy:  Vancomycin trough level 15-20 mcg/ml  Plan:  Continue Vancomycin 500mg   IV q24h Continue Zosyn 2.25gm IV q8h Monitor renal function closely  Legrand Como, Pharm.D., BCPS, AAHIVP Clinical Pharmacist Phone: [phone] or [phone] 08/20/2013, 3:07 PM

## 2013-08-20 NOTE — Progress Notes (Signed)
Subjective: Patient seen and examined at the bedside this morning. Overnight she had a fever of 101.2. Blood pressure stable. No bloody BMs, hematemesis, or hemoptysis. No complaints.  Objective: Vital signs in last 24 hours: Filed Vitals:   08/20/13 0140 08/20/13 0400 08/20/13 0736 08/20/13 0738  BP: 117/75   123/80  Pulse: 105   107  Temp: 99.2 F (37.3 C) 98.7 F (37.1 C) 99.3 F (37.4 C)   TempSrc: Oral Oral Oral   Resp: 20   19  Height:      Weight:      SpO2: 100%   99%   Weight change:   Intake/Output Summary (Last 24 hours) at 08/20/13 1047 Last data filed at 08/20/13 0040  Gross per 24 hour  Intake 1247.5 ml  Output      2 ml  Net 1245.5 ml   Vitals reviewed. General: resting in bed, in NAD, appears malnourished HEENT: no scleral icterus Cardiac: tachycardia, no rubs, murmurs or gallops Pulm: Bibasilar rales with R greater than L, no rhonchi or wheezing Abd: soft, nontender, nondistended, BS present Ext: warm and well perfused, no pedal edema Neuro: alert and oriented X3, moves all extremities voluntarily, no tremors or asterixis  Lab Results: Basic Metabolic Panel:  Recent Labs Lab 08/19/13 0632 08/20/13 0240  NA 131* 139  K 5.1 4.3  CL 98 105  CO2 18* 19  GLUCOSE 109* 82  BUN 22 21  CREATININE 1.93* 1.71*  CALCIUM 7.2* 7.2*  MG 2.1 1.6  PHOS 1.7* 3.8   Liver Function Tests:  Recent Labs Lab 08/19/13 0632 08/20/13 0240  AST 38* 36  ALT 12 11  ALKPHOS 255* 230*  BILITOT 0.9 1.7*  PROT 5.9* 5.8*  ALBUMIN 1.5* 1.4*    Recent Labs Lab 08/15/13 1038  LIPASE 8*   CBC:  Recent Labs Lab 08/15/13 1038  08/19/13 1130 08/20/13 0240  WBC 6.3  < > 14.7* 11.6*  NEUTROABS 3.7  --   --   --   HGB 8.0*  < > 5.1* 11.2*  HCT 23.3*  < > 14.6* 31.8*  MCV 98.7  < > 99.3 89.3  PLT 148*  < > 115* 115*  < > = values in this interval not displayed. Urine Drug Screen: Drugs of Abuse     Component Value Date/Time   LABOPIA NONE DETECTED  08/16/2013 0041   LABOPIA NEG 09/18/2011 0936   COCAINSCRNUR NONE DETECTED 08/16/2013 0041   COCAINSCRNUR NEG 09/18/2011 0936   LABBENZ NONE DETECTED 08/16/2013 0041   LABBENZ NEG 09/18/2011 0936   LABBENZ NEG 04/10/2011 1130   AMPHETMU NONE DETECTED 08/16/2013 0041   AMPHETMU NEG 04/10/2011 1130   THCU NONE DETECTED 08/16/2013 0041   LABBARB NONE DETECTED 08/16/2013 0041   LABBARB NEG 09/18/2011 0936    Alcohol Level:  Recent Labs Lab 08/14/13 0958 08/15/13 1038  ETH 244* 169*   Urinalysis:  Recent Labs Lab 08/16/13 0041  COLORURINE YELLOW  LABSPEC 1.013  PHURINE 5.5  GLUCOSEU NEGATIVE  HGBUR NEGATIVE  BILIRUBINUR NEGATIVE  KETONESUR NEGATIVE  PROTEINUR 100*  UROBILINOGEN 0.2  NITRITE NEGATIVE  LEUKOCYTESUR NEGATIVE    Micro Results: Recent Results (from the past 240 hour(s))  URINE CULTURE     Status: None   Collection Time    08/16/13 12:41 AM      Result Value Ref Range Status   Specimen Description URINE, RANDOM   Final   Special Requests ADD [id number]   Final  Culture  Setup Time     Final   Value: 08/16/2013 14:10     Performed at [facility]     Final   Value: >=100,000 COLONIES/ML     Performed at [facility]   Culture     Final   Value: ESCHERICHIA COLI     Performed at [facility]   Report Status 08/18/2013 FINAL   Final   Organism ID, Bacteria ESCHERICHIA COLI   Final  CULTURE, BLOOD (ROUTINE X 2)     Status: None   Collection Time    08/16/13  2:20 AM      Result Value Ref Range Status   Specimen Description BLOOD LEFT HAND   Final   Special Requests BOTTLES DRAWN AEROBIC ONLY 10 CC   Final   Culture  Setup Time     Final   Value: 08/16/2013 12:16     Performed at [facility]   Culture     Final   Value:        BLOOD CULTURE RECEIVED NO GROWTH TO DATE CULTURE WILL BE HELD FOR 5 DAYS BEFORE ISSUING A FINAL NEGATIVE REPORT     Performed at [facility]   Report Status PENDING    Incomplete  CULTURE, BLOOD (ROUTINE X 2)     Status: None   Collection Time    08/16/13  2:24 AM      Result Value Ref Range Status   Specimen Description BLOOD LEFT HAND   Final   Special Requests BOTTLES DRAWN AEROBIC ONLY 10 CC   Final   Culture  Setup Time     Final   Value: 08/16/2013 12:16     Performed at [facility]   Culture     Final   Value:        BLOOD CULTURE RECEIVED NO GROWTH TO DATE CULTURE WILL BE HELD FOR 5 DAYS BEFORE ISSUING A FINAL NEGATIVE REPORT     Performed at [facility]   Report Status PENDING   Incomplete  CLOSTRIDIUM DIFFICILE BY PCR     Status: None   Collection Time    08/19/13  4:25 AM      Result Value Ref Range Status   C difficile by pcr NEGATIVE  NEGATIVE Final  CULTURE, BLOOD (ROUTINE X 2)     Status: None   Collection Time    08/19/13  8:26 AM      Result Value Ref Range Status   Specimen Description BLOOD RIGHT HAND   Final   Special Requests BOTTLES DRAWN AEROBIC ONLY 5CC   Final   Culture  Setup Time     Final   Value: 08/19/2013 12:41     Performed at [facility]   Culture     Final   Value:        BLOOD CULTURE RECEIVED NO GROWTH TO DATE CULTURE WILL BE HELD FOR 5 DAYS BEFORE ISSUING A FINAL NEGATIVE REPORT     Performed at [facility]   Report Status PENDING   Incomplete  CULTURE, BLOOD (ROUTINE X 2)     Status: None   Collection Time    08/19/13  8:32 AM      Result Value Ref Range Status   Specimen Description BLOOD RIGHT HAND   Final   Special Requests BOTTLES DRAWN AEROBIC ONLY [facility]   Final   Culture  Setup Time  Final   Value: 08/19/2013 12:41     Performed at [facility]   Culture     Final   Value:        BLOOD CULTURE RECEIVED NO GROWTH TO DATE CULTURE WILL BE HELD FOR 5 DAYS BEFORE ISSUING A FINAL NEGATIVE REPORT     Performed at [facility]   Report Status PENDING   Incomplete  MRSA PCR SCREENING     Status: None   Collection Time    08/19/13  4:00 PM       Result Value Ref Range Status   MRSA by PCR NEGATIVE  NEGATIVE Final   Comment:            The GeneXpert MRSA Assay (FDA     approved for NASAL specimens     only), is one component of a     comprehensive MRSA colonization     surveillance program. It is not     intended to diagnose MRSA     infection nor to guide or     monitor treatment for     MRSA infections.   Studies/Results: Dg Chest Port 1 View  08/19/2013   CLINICAL DATA:  Evaluate central line placement  EXAM: PORTABLE CHEST - 1 VIEW  COMPARISON:  Chest x-ray of 08/15/2013 and CT chest of 08/16/2013  FINDINGS: There is little change to perhaps slight worsening of airspace disease of the lung bases right greater than left most consistent with and inflammatory or infectious process. A small right effusion cannot be excluded. Mediastinal contours are stable and heart size is stable. A right IJ central venous line is present with the tip overlying the expected SVC -RA junction.  IMPRESSION: 1. Right IJ central venous line tip overlies the expected SVC -RA junction. 2. Probable slight worsening of airspace disease at the bases right greater than left.   Electronically Signed   By: Ivar Drape M.D.   On: 08/19/2013 13:09   Medications: I have reviewed the patient's current medications. Scheduled Meds: . calcium carbonate  6 tablet Oral TID  . feeding supplement (ENSURE COMPLETE)  237 mL Oral TID BM  . ferrous sulfate  325 mg Oral BID WC  . FLUoxetine  10 mg Oral Daily  . folic acid  1 mg Oral Daily  . gabapentin  600 mg Oral TID  . lipase/protease/amylase  1 capsule Oral TID AC  . magnesium oxide  400 mg Oral QID  . multivitamin with minerals  1 tablet Oral Daily  . pantoprazole (PROTONIX) IV  40 mg Intravenous Q12H  . phosphorus  250 mg Oral QID  . piperacillin-tazobactam  2.25 g Intravenous Q6H  . polyethylene glycol-electrolytes  4,000 mL Oral Once  . sodium chloride  3 mL Intravenous Q12H  . sodium chloride  3 mL Intravenous  Q12H  . thiamine  100 mg Oral Daily   Or  . thiamine  100 mg Intravenous Daily  . vancomycin  500 mg Intravenous Q24H  . vitamin B-12  250 mcg Oral QPM   Continuous Infusions: . sodium chloride 75 mL/hr at 08/20/13 0641  . sodium chloride     PRN Meds:.sodium chloride, acetaminophen, promethazine, promethazine, sodium chloride  Assessment/Plan: 58 year old female with a history of EtOH abuse, chronic pancreatitis, protein calorie malnutrition, hypocalcemia and hypomagnesemia, HTN, CKD3 who presents to the ED because she was instructed to do so by the [facility] for low magnesium, found to have a pneumonia and sepsis.   #  Acute GI bleed - Hemoglobin 5.0 > 11.2 s/p 3 units PRBCs (more than appropriate response, possibly drawn too soon after transfusion? Will repeat). EGD from 6/14 did NOT show varices, but was positive for Mallory-Weiss tear. 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 INR wnl (1.41). Platelets stable at 115. C diff negative. GI to perform EGD/colonoscopy tomorrow. - Appreciate GI recs, Dr. Benson Norway - IVF NS @75cc /hr - Continue NPO for procedure - IV Protonix 40mg  BID - Holding heparin and ASA - Place and maintain SCDs - Monitor CBC q6h, transfusion threshold <7, type and screen has already been performed  Hemoglobin  Date Value Ref Range Status  08/20/2013 11.2* 12.0 - 15.0 g/dL Final     DELTA CHECK NOTED     POST TRANSFUSION SPECIMEN  08/19/2013 5.1* 12.0 - 15.0 g/dL Final     REPEATED TO VERIFY     CRITICAL RESULT CALLED TO, READ BACK BY AND VERIFIED WITH:     MEFFORD, C RN [id number] 08/19/2013 BY MACEDA, J  08/19/2013 5.0* 12.0 - 15.0 g/dL Final     REPEATED TO VERIFY     CRITICAL RESULT CALLED TO, READ BACK BY AND VERIFIED WITH:     Etheleen Mayhew RN [id number] 08/19/2013 BY MACEDA, J  08/18/2013 7.0* 12.0 - 15.0 g/dL Final  08/18/2013 8.1* 12.0 - 15.0 g/dL Final    #Severe sepsis - Still with fever,  Tmax 101.2 overnight. Tachycardia improved to 105. Lactate down trending. BP improved s/p IVF and blood products, 100s-130s/60-80s overnight. Likely source is pneumonia +/- UTI. Blood cultures are NGTD, they were repeated last night. - IVF as above - Vanc and zosyn per pharmacy, to cover for MRSA and anaerobes given risk of aspiration  - If spikes a fever again, will continue to repeat blood cultures, could also be 2/2 alcohol withdrawal - Tylenol per rectum if needed  #Multifocal pneumonia, CAP vs. aspiration - CT chest on 3/14 showed ground-glass opacities bilaterally and an area of patchy consolidative changes in the RLL concerning for a multifocal infectious/inflammatory process. She is an alcoholic and has a history of aspiration pneumonia. She had a 2L oxygen requirement yesterday, but is satting 100% on room air today. WBC is down trending (14.7 > 11.6). - Treatment as above with Vanc and Zosyn  - Continuous pulse ox - CBC as above  #UTI - Complained of urinary frequency on admission. UA with many bacteria, rare squams. Urine culture is growing >100K col/ml of E. Coli, pan sensitive (including to Zosyn). - Continue antibiotics as above  #Hypomagnesemia - Mag 1.6 this am. - Mag sulfate 2g IV x1 - NPO given GI bleed - Continue to monitor BMP, mag BID with repletion as needed  Magnesium  Date Value Ref Range Status  08/20/2013 1.6  1.5 - 2.5 mg/dL Final  08/19/2013 2.1  1.5 - 2.5 mg/dL Final  08/18/2013 2.9* 1.5 - 2.5 mg/dL Final  08/18/2013 1.4* 1.5 - 2.5 mg/dL Final  08/17/2013 1.6  1.5 - 2.5 mg/dL Final    #Hypophosphatemia - We are monitoring daily phosphorus due to risk of refeeding syndrome. Today it is normal. We were giving oral K phos neutral, but she is now NPO. Given risks of IV phos we will hold off on giving this unless symptomatic or severely deficient. - Continue to monitor phos daily  Phosphorus  Date Value Ref Range Status  08/20/2013 3.8  2.3 - 4.6 mg/dL Final  08/19/2013 1.7* 2.3 - 4.6 mg/dL Final  08/18/2013 1.2* 2.3 - 4.6 mg/dL Final  08/17/2013 1.6* 2.3 - 4.6 mg/dL Final  08/16/2013 3.9  2.3 - 4.6 mg/dL Final    #Acute on chronic normocytic anemia - Baseline hemoglobin 8.8-10.7. She has a history of B12 deficiency and folate deficiency causing macrocytic anemia. She is also on iron supplementation for a history of iron deficiency anemia. See GI bleed above for further discussion. 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  - Holding home calcium carbonate 500mg  6 tabs (1200 mg elemental calcium) 3 times a day given NPO  #Alcohol abuse - CIWA 0-4 overnight. No Ativan given. She remains tachycardic with intermittent fevers.She is telling us she hears voices in her sleep.  - Continue CIWA protocol  - Folate, thiamine, multivitamin as above (holding as NPO) - Will not schedule Ativan as she appears to be quite sensitive to it  #Severe protein calorie malnutrition - Albumin now 1.4. She admits to most of her daily po intake being "water and wine."  - Appreciate nutrition recs  - NPO for now but once able to eat, will give regular, liberalized diet  - Holding Ensure feeding supplements twice a day between meals while NPO - Will refer patient to outpatient Internal Medicine RD at discharge  #CKD3 - Baseline Cr 1.2-1.3 Trended up yesterday, but now stable s/p IVF. Suspect pre-renal process. - IVF as above - Continue to monitor   Creatinine, Ser  Date Value Ref Range Status  08/20/2013 1.71* 0.50 - 1.10 mg/dL Final  08/19/2013 1.93* 0.50 - 1.10 mg/dL Final  08/18/2013 1.60* 0.50 - 1.10 mg/dL Final  08/18/2013 1.43* 0.50 - 1.10 mg/dL Final  08/17/2013 1.40* 0.50 - 1.10 mg/dL Final    #Chronic  anion gap metabolic acidosis - AG 15, stable. ABG below from 3/14 shows primary metabolic acidosis, with increased anion gap,with full respiratory compensation, likely 2/2 lactic acidosis. UA negative for ketones.  - Treating sepsis as above  - Will repeat ABG if respiratory status compromise  ABG    Component  Value  Date/Time    PHART  7.324*  08/16/2013 0940    PCO2ART  29.5*  08/16/2013 0940    PO2ART  73.8*  08/16/2013 0940    HCO3  14.9*  08/16/2013 0940    TCO2  15.7  08/16/2013 0940    ACIDBASEDEF  9.9*  08/16/2013 0940    O2SAT  93.2  08/16/2013 0940    #Chronic pancreatitis - Stable. Patient denies nausea, vomiting, diarrhea.  - Holding home Creon while NPO  #Depression - Apathy and blunted affect noted on exam.  - Holding Prozac 10 mg daily while NPO  #HTN - Currently normotensive. - Holding home amlodipine 5 mg daily  - Holding home Lasix 20 mg daily  #Tachycardia - A chronic issue per chart review, but now with fever, lactic acidosis, hypotension. - Resuscitation as above - Continue to monitor on telemetry   #GERD - PPI as above.  #Neuropathic pain/anxiety - Holding home gabapentin 600 mg 3 times a day while NPO  #DVT PPX - SCDs  Dispo: Disposition is deferred at this time, awaiting improvement of current medical problems.  Anticipated discharge in approximately 1-2 day(s).   The patient does  have a current PCP Otho Bellows, MD) and does need an [facility] hospital follow-up appointment after discharge.  The patient does not have transportation limitations that hinder transportation to clinic appointments.  .Services Needed at time of discharge: Y = Yes, Blank = No PT:   OT:   RN:   Equipment:   Other:     LOS: 5 days   Lesly Dukes, MD 08/20/2013, 10:47 AM  Lesly Dukes, MD  [email or web address] Pager # [phone] Office # [phone]

## 2013-08-20 NOTE — Progress Notes (Signed)
Subjective: Patient seen and examined at the bedside this morning. Overnight she had a fever of 101.2. Her blood pressure stable. Her nurse reports that she had one bloody bowel movement yesterday afternoon, but no hematemesis or hemoptysis. The patient said that she is hungry, but voiced understanding of liquid diet today and NPO status at midnight for UGD/Colonoscopy tomorrow.  Objective: Vital signs in last 24 hours: Filed Vitals:   08/20/13 0140 08/20/13 0400 08/20/13 0736 08/20/13 0738  BP: 117/75   123/80  Pulse: 105   107  Temp: 99.2 F (37.3 C) 98.7 F (37.1 C) 99.3 F (37.4 C)   TempSrc: Oral Oral Oral   Resp: 20   19  Height:      Weight:      SpO2: 100%   99%   Weight change:   Intake/Output Summary (Last 24 hours) at 08/20/13 1116 Last data filed at 08/20/13 0040  Gross per 24 hour  Intake 1247.5 ml  Output      2 ml  Net 1245.5 ml   Vitals reviewed. General: resting in bed, in NAD, appears malnourished HEENT: no scleral icterus Cardiac: tachycardia, no rubs, murmurs or gallops Pulm: Bibasilar rales with R greater than L, no rhonchi or wheezing Abd: soft, nontender, nondistended, BS present Ext: warm and well perfused, no pedal edema Neuro: alert and oriented X3, moves all extremities voluntarily, no tremors or asterixis  Lab Results: Basic Metabolic Panel:  Recent Labs Lab 08/19/13 0632 08/20/13 0240  NA 131* 139  K 5.1 4.3  CL 98 105  CO2 18* 19  GLUCOSE 109* 82  BUN 22 21  CREATININE 1.93* 1.71*  CALCIUM 7.2* 7.2*  MG 2.1 1.6  PHOS 1.7* 3.8   Liver Function Tests:  Recent Labs Lab 08/19/13 0632 08/20/13 0240  AST 38* 36  ALT 12 11  ALKPHOS 255* 230*  BILITOT 0.9 1.7*  PROT 5.9* 5.8*  ALBUMIN 1.5* 1.4*    Recent Labs Lab 08/15/13 1038  LIPASE 8*   CBC:  Recent Labs Lab 08/15/13 1038  08/20/13 0240 08/20/13 0800  WBC 6.3  < > 11.6* 10.4  NEUTROABS 3.7  --   --   --   HGB 8.0*  < > 11.2* 11.1*  HCT 23.3*  < > 31.8*  31.9*  MCV 98.7  < > 89.3 89.6  PLT 148*  < > 115* 123*  < > = values in this interval not displayed. Urine Drug Screen: Drugs of Abuse     Component Value Date/Time   LABOPIA NONE DETECTED 08/16/2013 0041   LABOPIA NEG 09/18/2011 0936   COCAINSCRNUR NONE DETECTED 08/16/2013 0041   COCAINSCRNUR NEG 09/18/2011 0936   LABBENZ NONE DETECTED 08/16/2013 0041   LABBENZ NEG 09/18/2011 0936   LABBENZ NEG 04/10/2011 1130   AMPHETMU NONE DETECTED 08/16/2013 0041   AMPHETMU NEG 04/10/2011 1130   THCU NONE DETECTED 08/16/2013 0041   LABBARB NONE DETECTED 08/16/2013 0041   LABBARB NEG 09/18/2011 0936    Alcohol Level:  Recent Labs Lab 08/14/13 0958 08/15/13 1038  ETH 244* 169*   Urinalysis:  Recent Labs Lab 08/16/13 0041  COLORURINE YELLOW  LABSPEC 1.013  PHURINE 5.5  GLUCOSEU NEGATIVE  HGBUR NEGATIVE  BILIRUBINUR NEGATIVE  KETONESUR NEGATIVE  PROTEINUR 100*  UROBILINOGEN 0.2  NITRITE NEGATIVE  LEUKOCYTESUR NEGATIVE    Micro Results: Recent Results (from the past 240 hour(s))  URINE CULTURE     Status: None   Collection Time    08/16/13  12:41 AM      Result Value Ref Range Status   Specimen Description URINE, RANDOM   Final   Special Requests ADD [phone]   Final   Culture  Setup Time     Final   Value: 08/16/2013 14:10     Performed at [facility] Count     Final   Value: >=100,000 COLONIES/ML     Performed at [facility]   Culture     Final   Value: ESCHERICHIA COLI     Performed at [facility]   Report Status 08/18/2013 FINAL   Final   Organism ID, Bacteria ESCHERICHIA COLI   Final  CULTURE, BLOOD (ROUTINE X 2)     Status: None   Collection Time    08/16/13  2:20 AM      Result Value Ref Range Status   Specimen Description BLOOD LEFT HAND   Final   Special Requests BOTTLES DRAWN AEROBIC ONLY 10 CC   Final   Culture  Setup Time     Final   Value: 08/16/2013 12:16     Performed at [facility]   Culture     Final    Value:        BLOOD CULTURE RECEIVED NO GROWTH TO DATE CULTURE WILL BE HELD FOR 5 DAYS BEFORE ISSUING A FINAL NEGATIVE REPORT     Performed at [facility]   Report Status PENDING   Incomplete  CULTURE, BLOOD (ROUTINE X 2)     Status: None   Collection Time    08/16/13  2:24 AM      Result Value Ref Range Status   Specimen Description BLOOD LEFT HAND   Final   Special Requests BOTTLES DRAWN AEROBIC ONLY 10 CC   Final   Culture  Setup Time     Final   Value: 08/16/2013 12:16     Performed at [facility]   Culture     Final   Value:        BLOOD CULTURE RECEIVED NO GROWTH TO DATE CULTURE WILL BE HELD FOR 5 DAYS BEFORE ISSUING A FINAL NEGATIVE REPORT     Performed at [facility]   Report Status PENDING   Incomplete  CLOSTRIDIUM DIFFICILE BY PCR     Status: None   Collection Time    08/19/13  4:25 AM      Result Value Ref Range Status   C difficile by pcr NEGATIVE  NEGATIVE Final  CULTURE, BLOOD (ROUTINE X 2)     Status: None   Collection Time    08/19/13  8:26 AM      Result Value Ref Range Status   Specimen Description BLOOD RIGHT HAND   Final   Special Requests BOTTLES DRAWN AEROBIC ONLY 5CC   Final   Culture  Setup Time     Final   Value: 08/19/2013 12:41     Performed at [facility]   Culture     Final   Value:        BLOOD CULTURE RECEIVED NO GROWTH TO DATE CULTURE WILL BE HELD FOR 5 DAYS BEFORE ISSUING A FINAL NEGATIVE REPORT     Performed at [facility]   Report Status PENDING   Incomplete  CULTURE, BLOOD (ROUTINE X 2)     Status: None   Collection Time    08/19/13  8:32 AM      Result Value  Ref Range Status   Specimen Description BLOOD RIGHT HAND   Final   Special Requests BOTTLES DRAWN AEROBIC ONLY [facility]   Final   Culture  Setup Time     Final   Value: 08/19/2013 12:41     Performed at [facility]   Culture     Final   Value:        BLOOD CULTURE RECEIVED NO GROWTH TO DATE CULTURE WILL BE HELD FOR 5 DAYS BEFORE  ISSUING A FINAL NEGATIVE REPORT     Performed at [facility]   Report Status PENDING   Incomplete  MRSA PCR SCREENING     Status: None   Collection Time    08/19/13  4:00 PM      Result Value Ref Range Status   MRSA by PCR NEGATIVE  NEGATIVE Final   Comment:            The GeneXpert MRSA Assay (FDA     approved for NASAL specimens     only), is one component of a     comprehensive MRSA colonization     surveillance program. It is not     intended to diagnose MRSA     infection nor to guide or     monitor treatment for     MRSA infections.   Studies/Results: Dg Chest Port 1 View  08/19/2013   CLINICAL DATA:  Evaluate central line placement  EXAM: PORTABLE CHEST - 1 VIEW  COMPARISON:  Chest x-ray of 08/15/2013 and CT chest of 08/16/2013  FINDINGS: There is little change to perhaps slight worsening of airspace disease of the lung bases right greater than left most consistent with and inflammatory or infectious process. A small right effusion cannot be excluded. Mediastinal contours are stable and heart size is stable. A right IJ central venous line is present with the tip overlying the expected SVC -RA junction.  IMPRESSION: 1. Right IJ central venous line tip overlies the expected SVC -RA junction. 2. Probable slight worsening of airspace disease at the bases right greater than left.   Electronically Signed   By: Ivar Drape M.D.   On: 08/19/2013 13:09   Medications: I have reviewed the patient's current medications. Scheduled Meds: . calcium carbonate  6 tablet Oral TID  . feeding supplement (ENSURE COMPLETE)  237 mL Oral TID BM  . ferrous sulfate  325 mg Oral BID WC  . FLUoxetine  10 mg Oral Daily  . folic acid  1 mg Oral Daily  . gabapentin  600 mg Oral TID  . lipase/protease/amylase  1 capsule Oral TID AC  . magnesium oxide  400 mg Oral QID  . multivitamin with minerals  1 tablet Oral Daily  . pantoprazole (PROTONIX) IV  40 mg Intravenous Q12H  . phosphorus  250 mg Oral  QID  . piperacillin-tazobactam  2.25 g Intravenous Q6H  . polyethylene glycol-electrolytes  4,000 mL Oral Once  . sodium chloride  3 mL Intravenous Q12H  . sodium chloride  3 mL Intravenous Q12H  . thiamine  100 mg Oral Daily   Or  . thiamine  100 mg Intravenous Daily  . vancomycin  500 mg Intravenous Q24H  . vitamin B-12  250 mcg Oral QPM   Continuous Infusions: . sodium chloride 75 mL/hr at 08/20/13 0641  . sodium chloride     PRN Meds:.sodium chloride, acetaminophen, promethazine, promethazine, sodium chloride  Assessment/Plan: 58 year old female with a history of EtOH abuse, chronic  pancreatitis, protein calorie malnutrition, hypocalcemia and hypomagnesemia, HTN, CKD3 who presents to the ED because she was instructed to do so by the [facility] for low magnesium, found to have a pneumonia and sepsis.   #Acute GI bleed - Hemoglobin 5.0 > 11.2>11.1 s/p 3 units PRBCs. EGD from 6/14 did NOT show varices, but was positive for Mallory-Weiss tear. 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 INR wnl (1.41). Platelets stable at 115. C diff negative. GI to perform EGD/colonoscopy tomorrow. - Appreciate GI recs, Dr. Benson Norway will perform EGD/colonoscopy tomorrow. - IVF NS @75cc /hr - Continue NPO for procedure - IV Protonix 40mg  BID - Holding heparin and ASA - Place and maintain SCDs - Monitor CBC q6h, transfusion threshold <7, type and screen has already been performed  Hemoglobin  Date Value Ref Range Status  08/20/2013 11.1* 12.0 - 15.0 g/dL Final  08/20/2013 11.2* 12.0 - 15.0 g/dL Final     DELTA CHECK NOTED     POST TRANSFUSION SPECIMEN  08/19/2013 5.1* 12.0 - 15.0 g/dL Final     REPEATED TO VERIFY     CRITICAL RESULT CALLED TO, READ BACK BY AND VERIFIED WITH:     MEFFORD, C RN [id number] 08/19/2013 BY MACEDA, J  08/19/2013 5.0* 12.0 - 15.0 g/dL Final     REPEATED TO VERIFY     CRITICAL RESULT CALLED TO, READ BACK BY  AND VERIFIED WITH:     Etheleen Mayhew RN [id number] 08/19/2013 BY MACEDA, J  08/18/2013 7.0* 12.0 - 15.0 g/dL Final    #Severe sepsis - Still with fever, Tmax 101.2 overnight. Tachycardia improved to 105. Lactate down trending. BP improved s/p IVF and blood products, 100s-130s/60-80s overnight. Likely source is pneumonia +/- UTI. Blood cultures are NGTD, they were repeated last night. - IVF as above - Vanc and zosyn per pharmacy, to cover for MRSA and anaerobes given risk of aspiration  - If spikes a fever again, will continue to repeat blood cultures, could also be 2/2 alcohol withdrawal - Tylenol per rectum if needed  #Multifocal pneumonia, CAP vs. aspiration - CT chest on 3/14 showed ground-glass opacities bilaterally and an area of patchy consolidative changes in the RLL concerning for a multifocal infectious/inflammatory process. She is an alcoholic and has a history of aspiration pneumonia. She had a 2L oxygen requirement yesterday, but is satting 100% on room air today. WBC is down trending (14.7 > 11.6). - Treatment as above with Vanc and Zosyn  - Continuous pulse ox - CBC as above  #UTI - Complained of urinary frequency on admission. UA with many bacteria, rare squams. Urine culture is growing >100K col/ml of E. Coli, pan sensitive (including to Zosyn). - Continue antibiotics as above  #Hypomagnesemia - Mag 1.6 this am. - Mag sulfate 2g IV x1 - NPO given GI bleed - Continue to monitor BMP, mag BID with repletion as needed  Magnesium  Date Value Ref Range Status  08/20/2013 1.6  1.5 - 2.5 mg/dL Final  08/19/2013 2.1  1.5 - 2.5 mg/dL Final  08/18/2013 2.9* 1.5 - 2.5 mg/dL Final  08/18/2013 1.4* 1.5 - 2.5 mg/dL Final  08/17/2013 1.6  1.5 - 2.5 mg/dL Final    #Hypophosphatemia - We are monitoring daily phosphorus due to risk of refeeding syndrome. Today it is normal. We were giving oral K phos neutral, but she is now NPO. Given risks of IV phos we will hold off on giving this unless symptomatic  or severely deficient. - Continue to monitor phos daily  Phosphorus  Date Value Ref Range Status  08/20/2013 3.8  2.3 - 4.6 mg/dL Final  08/19/2013 1.7* 2.3 - 4.6 mg/dL Final  08/18/2013 1.2* 2.3 - 4.6 mg/dL Final  08/17/2013 1.6* 2.3 - 4.6 mg/dL Final  08/16/2013 3.9  2.3 - 4.6 mg/dL Final    #Acute on chronic normocytic anemia - Baseline hemoglobin 8.8-10.7. She has a history of B12 deficiency and folate deficiency causing macrocytic anemia. She is also on iron supplementation for a history of iron deficiency anemia. See GI bleed above for further discussion. 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  - Holding home calcium carbonate 500mg  6 tabs (1200 mg elemental calcium) 3 times a day given NPO  #Alcohol abuse - CIWA 0-4 overnight. No Ativan given. She remains tachycardic with intermittent fevers.She is telling us she hears voices in her sleep.  - Continue CIWA protocol  - Folate, thiamine, multivitamin as above (holding as NPO) - Will not schedule Ativan as she appears to be quite sensitive to it  #Severe protein calorie malnutrition - Albumin now 1.4. She admits to most of her daily po intake being "water and wine."  - Appreciate nutrition recs  - NPO for now but once able to eat, will give regular, liberalized diet  - Holding Ensure feeding supplements twice a day between meals while NPO - Will refer patient to outpatient Internal Medicine RD at discharge  #CKD3 - Baseline Cr 1.2-1.3 Trended up yesterday, but now stable s/p IVF. Suspect pre-renal process. - IVF as above - Continue to monitor   Creatinine, Ser  Date Value Ref Range Status  08/20/2013 1.71* 0.50 - 1.10 mg/dL Final  08/19/2013 1.93* 0.50 - 1.10 mg/dL Final    08/18/2013 1.60* 0.50 - 1.10 mg/dL Final  08/18/2013 1.43* 0.50 - 1.10 mg/dL Final  08/17/2013 1.40* 0.50 - 1.10 mg/dL Final    #Chronic anion gap metabolic acidosis - AG 15, stable. ABG below from 3/14 shows primary metabolic acidosis, with increased anion gap,with full respiratory compensation, likely 2/2 lactic acidosis. UA negative for ketones.  - Treating sepsis as above  - Will repeat ABG if respiratory status compromise  ABG    Component  Value  Date/Time    PHART  7.324*  08/16/2013 0940    PCO2ART  29.5*  08/16/2013 0940    PO2ART  73.8*  08/16/2013 0940    HCO3  14.9*  08/16/2013 0940    TCO2  15.7  08/16/2013 0940    ACIDBASEDEF  9.9*  08/16/2013 0940    O2SAT  93.2  08/16/2013 0940    #Chronic pancreatitis - Stable. Patient denies nausea, vomiting, diarrhea.  - Holding home Creon while NPO  #Depression - Apathy and blunted affect noted on exam.  - Holding Prozac 10 mg daily while NPO  #HTN - Currently normotensive. - Holding home amlodipine 5 mg daily  - Holding home Lasix 20 mg daily  #Tachycardia - A chronic issue per chart review, but now with fever, lactic acidosis, hypotension. - Resuscitation as above - Continue to monitor on telemetry   #GERD - PPI as above.  #Neuropathic pain/anxiety - Holding home gabapentin 600 mg 3 times a day while NPO  #DVT PPX -  SCDs  This is a Careers information officer note. The patient was seen and discussed with Dr. Lucila Maine, who agrees with the above plan.   LOS: 5 days   Donnelly Angelica, Med Student 08/20/2013, 11:16 AM

## 2013-08-21 ENCOUNTER — Encounter (HOSPITAL_COMMUNITY)
Admission: EM | Disposition: A | Discharge status: Home Health | Payer: Self-pay | Source: Home / Self Care | Attending: Internal Medicine

## 2013-08-21 ENCOUNTER — Encounter (HOSPITAL_COMMUNITY): Payer: Self-pay

## 2013-08-21 DIAGNOSIS — R634 Abnormal weight loss: Secondary | ICD-10-CM

## 2013-08-21 DIAGNOSIS — F1020 Alcohol dependence, uncomplicated: Secondary | ICD-10-CM

## 2013-08-21 DIAGNOSIS — E43 Unspecified severe protein-calorie malnutrition: Secondary | ICD-10-CM

## 2013-08-21 DIAGNOSIS — I10 Essential (primary) hypertension: Secondary | ICD-10-CM

## 2013-08-21 HISTORY — PX: COLONOSCOPY WITH ESOPHAGOGASTRODUODENOSCOPY (EGD): SHX5779

## 2013-08-21 LAB — BASIC METABOLIC PANEL
BUN: 16 mg/dL (ref 6–23)
BUN: 13 mg/dL (ref 6–23)
CALCIUM: 7.3 mg/dL — AB (ref 8.4–10.5)
CHLORIDE: 105 meq/L (ref 96–112)
CO2: 19 mEq/L (ref 19–32)
CO2: 18 meq/L — AB (ref 19–32)
CREATININE: 1.58 mg/dL — AB (ref 0.50–1.10)
Calcium: 7.2 mg/dL — ABNORMAL LOW (ref 8.4–10.5)
Chloride: 103 mEq/L (ref 96–112)
Creatinine, Ser: 1.47 mg/dL — ABNORMAL HIGH (ref 0.50–1.10)
GFR calc Af Amer: 41 mL/min — ABNORMAL LOW (ref >90)
GFR calc Af Amer: 45 mL/min — ABNORMAL LOW (ref >90)
GFR calc non Af Amer: 36 mL/min — ABNORMAL LOW (ref >90)
GFR calc non Af Amer: 39 mL/min — ABNORMAL LOW (ref >90)
GLUCOSE: 113 mg/dL — AB (ref 70–99)
Glucose, Bld: 114 mg/dL — ABNORMAL HIGH (ref 70–99)
POTASSIUM: 3.7 meq/L (ref 3.7–5.3)
Potassium: 4.2 mEq/L (ref 3.7–5.3)
SODIUM: 137 meq/L (ref 137–147)
Sodium: 135 mEq/L — ABNORMAL LOW (ref 137–147)

## 2013-08-21 LAB — CBC
HEMATOCRIT: 30.4 % — AB (ref 36.0–46.0)
HEMATOCRIT: 31 % — AB (ref 36.0–46.0)
HEMATOCRIT: 32.1 % — AB (ref 36.0–46.0)
HEMOGLOBIN: 10.8 g/dL — AB (ref 12.0–15.0)
HEMOGLOBIN: 10.9 g/dL — AB (ref 12.0–15.0)
HEMOGLOBIN: 11.2 g/dL — AB (ref 12.0–15.0)
MCH: 32 pg (ref 26.0–34.0)
MCH: 32.1 pg (ref 26.0–34.0)
MCH: 32.1 pg (ref 26.0–34.0)
MCHC: 35.5 g/dL (ref 30.0–36.0)
MCHC: 35.2 g/dL (ref 30.0–36.0)
MCHC: 34.9 g/dL (ref 30.0–36.0)
MCV: 89.9 fL (ref 78.0–100.0)
MCV: 91.2 fL (ref 78.0–100.0)
MCV: 92 fL (ref 78.0–100.0)
PLATELETS: 139 10*3/uL — AB (ref 150–400)
Platelets: 133 10*3/uL — ABNORMAL LOW (ref 150–400)
Platelets: 135 10*3/uL — ABNORMAL LOW (ref 150–400)
RBC: 3.38 MIL/uL — AB (ref 3.87–5.11)
RBC: 3.4 MIL/uL — ABNORMAL LOW (ref 3.87–5.11)
RBC: 3.49 MIL/uL — ABNORMAL LOW (ref 3.87–5.11)
RDW: 16.9 % — ABNORMAL HIGH (ref 11.5–15.5)
RDW: 17.2 % — ABNORMAL HIGH (ref 11.5–15.5)
RDW: 17.1 % — ABNORMAL HIGH (ref 11.5–15.5)
WBC: 9.7 10*3/uL (ref 4.0–10.5)
WBC: 7.6 10*3/uL (ref 4.0–10.5)
WBC: 8.5 10*3/uL (ref 4.0–10.5)

## 2013-08-21 LAB — PHOSPHORUS: Phosphorus: 3.5 mg/dL (ref 2.3–4.6)

## 2013-08-21 LAB — LACTIC ACID, PLASMA: Lactic Acid, Venous: 1.7 mmol/L (ref 0.5–2.2)

## 2013-08-21 LAB — MAGNESIUM
Magnesium: 1.7 mg/dL (ref 1.5–2.5)
Magnesium: 2.2 mg/dL (ref 1.5–2.5)

## 2013-08-21 SURGERY — COLONOSCOPY WITH ESOPHAGOGASTRODUODENOSCOPY (EGD)
Anesthesia: Moderate Sedation | Laterality: Left

## 2013-08-21 MED ORDER — MAGNESIUM SULFATE 40 MG/ML IJ SOLN
2.0000 g | Freq: Once | INTRAMUSCULAR | Status: AC
  Administered 2013-08-21: 2 g via INTRAVENOUS
  Filled 2013-08-21: qty 50

## 2013-08-21 MED ORDER — MIDAZOLAM HCL 10 MG/2ML IJ SOLN
INTRAMUSCULAR | Status: DC | PRN
  Administered 2013-08-21: 1 mg via INTRAVENOUS
  Administered 2013-08-21: 2 mg via INTRAVENOUS
  Administered 2013-08-21: 1 mg via INTRAVENOUS

## 2013-08-21 MED ORDER — FENTANYL CITRATE 0.05 MG/ML IJ SOLN
INTRAMUSCULAR | Status: DC | PRN
  Administered 2013-08-21: 12.5 ug via INTRAVENOUS
  Administered 2013-08-21: 25 ug via INTRAVENOUS

## 2013-08-21 MED ORDER — DIPHENHYDRAMINE HCL 50 MG/ML IJ SOLN
INTRAMUSCULAR | Status: AC
  Filled 2013-08-21: qty 1

## 2013-08-21 MED ORDER — ACETAMINOPHEN 325 MG PO TABS
650.0000 mg | ORAL_TABLET | Freq: Four times a day (QID) | ORAL | Status: DC | PRN
  Administered 2013-08-21: 650 mg via ORAL
  Filled 2013-08-21: qty 2

## 2013-08-21 MED ORDER — MIDAZOLAM HCL 5 MG/ML IJ SOLN
INTRAMUSCULAR | Status: AC
  Filled 2013-08-21: qty 2

## 2013-08-21 MED ORDER — FENTANYL CITRATE 0.05 MG/ML IJ SOLN
INTRAMUSCULAR | Status: AC
  Filled 2013-08-21: qty 2

## 2013-08-21 NOTE — H&P (View-Only) (Signed)
Subjective: Patient seen and examined at the bedside this morning. Yesterday afternoon she had a fever of 101.2. Blood pressure stable. No bloody BMs, hematemesis, or hemoptysis. No complaints except some nausea overnight. EGD/colonoscopy today.  Objective: Vital signs in last 24 hours: Filed Vitals:   08/20/13 2348 08/21/13 0337 08/21/13 0700 08/21/13 1100  BP: 111/77 145/96    Pulse: 109 114    Temp: 99.6 F (37.6 C) 98.8 F (37.1 C) 99.6 F (37.6 C) 98.3 F (36.8 C)  TempSrc: Oral Oral Oral Oral  Resp: 16 22    Height:      Weight:      SpO2: 97% 96%     Weight change:   Intake/Output Summary (Last 24 hours) at 08/21/13 1229 Last data filed at 08/21/13 0600  Gross per 24 hour  Intake   2825 ml  Output      0 ml  Net   2825 ml   Vitals reviewed. General: resting in bed, in NAD, appears malnourished HEENT: no scleral icterus Cardiac: tachycardia, no rubs, murmurs or gallops Pulm: Rales in middle and lower lung fields with R greater than L, no rhonchi or wheezing Abd: soft, nontender, nondistended, BS present Ext: warm and well perfused, no pedal edema Neuro: alert and oriented X3, moves all extremities voluntarily, no tremors or asterixis  Lab Results: Basic Metabolic Panel:  Recent Labs Lab 08/20/13 0240 08/20/13 1720 08/21/13 0230  NA 139 134* 135*  K 4.3 4.5 4.2  CL 105 100 103  CO2 19 18* 19  GLUCOSE 82 88 114*  BUN 21 18 16   CREATININE 1.71* 1.55* 1.58*  CALCIUM 7.2* 7.3* 7.3*  MG 1.6 2.1 1.7  PHOS 3.8  --  3.5   Liver Function Tests:  Recent Labs Lab 08/19/13 0632 08/20/13 0240  AST 38* 36  ALT 12 11  ALKPHOS 255* 230*  BILITOT 0.9 1.7*  PROT 5.9* 5.8*  ALBUMIN 1.5* 1.4*    Recent Labs Lab 08/15/13 1038  LIPASE 8*   CBC:  Recent Labs Lab 08/15/13 1038  08/21/13 0230 08/21/13 0912  WBC 6.3  < > 9.7 7.6  NEUTROABS 3.7  --   --   --   HGB 8.0*  < > 10.8* 10.9*  HCT 23.3*  < > 30.4* 31.0*  MCV 98.7  < > 89.9 91.2  PLT 148*   < > 133* 135*  < > = values in this interval not displayed. Urine Drug Screen: Drugs of Abuse     Component Value Date/Time   LABOPIA NONE DETECTED 08/16/2013 0041   LABOPIA NEG 09/18/2011 0936   COCAINSCRNUR NONE DETECTED 08/16/2013 0041   COCAINSCRNUR NEG 09/18/2011 0936   LABBENZ NONE DETECTED 08/16/2013 0041   LABBENZ NEG 09/18/2011 0936   LABBENZ NEG 04/10/2011 1130   AMPHETMU NONE DETECTED 08/16/2013 0041   AMPHETMU NEG 04/10/2011 1130   THCU NONE DETECTED 08/16/2013 0041   LABBARB NONE DETECTED 08/16/2013 0041   LABBARB NEG 09/18/2011 0936    Alcohol Level:  Recent Labs Lab 08/15/13 1038  ETH 169*   Urinalysis:  Recent Labs Lab 08/16/13 0041  COLORURINE YELLOW  LABSPEC 1.013  PHURINE 5.5  GLUCOSEU NEGATIVE  HGBUR NEGATIVE  BILIRUBINUR NEGATIVE  KETONESUR NEGATIVE  PROTEINUR 100*  UROBILINOGEN 0.2  NITRITE NEGATIVE  LEUKOCYTESUR NEGATIVE    Micro Results: Recent Results (from the past 240 hour(s))  URINE CULTURE     Status: None   Collection Time    08/16/13 12:41 AM  Result Value Ref Range Status   Specimen Description URINE, RANDOM   Final   Special Requests ADD [phone]   Final   Culture  Setup Time     Final   Value: 08/16/2013 14:10     Performed at [facility] Count     Final   Value: >=100,000 COLONIES/ML     Performed at [facility]   Culture     Final   Value: ESCHERICHIA COLI     Performed at [facility]   Report Status 08/18/2013 FINAL   Final   Organism ID, Bacteria ESCHERICHIA COLI   Final  CULTURE, BLOOD (ROUTINE X 2)     Status: None   Collection Time    08/16/13  2:20 AM      Result Value Ref Range Status   Specimen Description BLOOD LEFT HAND   Final   Special Requests BOTTLES DRAWN AEROBIC ONLY 10 CC   Final   Culture  Setup Time     Final   Value: 08/16/2013 12:16     Performed at [facility]   Culture     Final   Value:        BLOOD CULTURE RECEIVED NO GROWTH TO DATE CULTURE  WILL BE HELD FOR 5 DAYS BEFORE ISSUING A FINAL NEGATIVE REPORT     Performed at [facility]   Report Status PENDING   Incomplete  CULTURE, BLOOD (ROUTINE X 2)     Status: None   Collection Time    08/16/13  2:24 AM      Result Value Ref Range Status   Specimen Description BLOOD LEFT HAND   Final   Special Requests BOTTLES DRAWN AEROBIC ONLY 10 CC   Final   Culture  Setup Time     Final   Value: 08/16/2013 12:16     Performed at [facility]   Culture     Final   Value:        BLOOD CULTURE RECEIVED NO GROWTH TO DATE CULTURE WILL BE HELD FOR 5 DAYS BEFORE ISSUING A FINAL NEGATIVE REPORT     Performed at [facility]   Report Status PENDING   Incomplete  CLOSTRIDIUM DIFFICILE BY PCR     Status: None   Collection Time    08/19/13  4:25 AM      Result Value Ref Range Status   C difficile by pcr NEGATIVE  NEGATIVE Final  CULTURE, BLOOD (ROUTINE X 2)     Status: None   Collection Time    08/19/13  8:26 AM      Result Value Ref Range Status   Specimen Description BLOOD RIGHT HAND   Final   Special Requests BOTTLES DRAWN AEROBIC ONLY 5CC   Final   Culture  Setup Time     Final   Value: 08/19/2013 12:41     Performed at [facility]   Culture     Final   Value:        BLOOD CULTURE RECEIVED NO GROWTH TO DATE CULTURE WILL BE HELD FOR 5 DAYS BEFORE ISSUING A FINAL NEGATIVE REPORT     Performed at [facility]   Report Status PENDING   Incomplete  CULTURE, BLOOD (ROUTINE X 2)     Status: None   Collection Time    08/19/13  8:32 AM      Result Value Ref Range Status   Specimen Description  BLOOD RIGHT HAND   Final   Special Requests BOTTLES DRAWN AEROBIC ONLY [facility]   Final   Culture  Setup Time     Final   Value: 08/19/2013 12:41     Performed at [facility]   Culture     Final   Value:        BLOOD CULTURE RECEIVED NO GROWTH TO DATE CULTURE WILL BE HELD FOR 5 DAYS BEFORE ISSUING A FINAL NEGATIVE REPORT     Performed at [facility]   Report Status PENDING   Incomplete  MRSA PCR SCREENING     Status: None   Collection Time    08/19/13  4:00 PM      Result Value Ref Range Status   MRSA by PCR NEGATIVE  NEGATIVE Final   Comment:            The GeneXpert MRSA Assay (FDA     approved for NASAL specimens     only), is one component of a     comprehensive MRSA colonization     surveillance program. It is not     intended to diagnose MRSA     infection nor to guide or     monitor treatment for     MRSA infections.   Studies/Results: Dg Chest Port 1 View  08/19/2013   CLINICAL DATA:  Evaluate central line placement  EXAM: PORTABLE CHEST - 1 VIEW  COMPARISON:  Chest x-ray of 08/15/2013 and CT chest of 08/16/2013  FINDINGS: There is little change to perhaps slight worsening of airspace disease of the lung bases right greater than left most consistent with and inflammatory or infectious process. A small right effusion cannot be excluded. Mediastinal contours are stable and heart size is stable. A right IJ central venous line is present with the tip overlying the expected SVC -RA junction.  IMPRESSION: 1. Right IJ central venous line tip overlies the expected SVC -RA junction. 2. Probable slight worsening of airspace disease at the bases right greater than left.   Electronically Signed   By: Ivar Drape M.D.   On: 08/19/2013 13:09   Medications: I have reviewed the patient's current medications. Scheduled Meds: . calcium carbonate  6 tablet Oral TID  . feeding supplement (ENSURE COMPLETE)  237 mL Oral TID BM  . ferrous sulfate  325 mg Oral BID WC  . FLUoxetine  10 mg Oral Daily  . folic acid  1 mg Oral Daily  . gabapentin  600 mg Oral TID  . lipase/protease/amylase  1 capsule Oral TID AC  . magnesium oxide  400 mg Oral QID  . magnesium sulfate 1 - 4 g bolus IVPB  2 g Intravenous Once  . multivitamin with minerals  1 tablet Oral Daily  . pantoprazole (PROTONIX) IV  40 mg Intravenous Q12H  . phosphorus  250 mg Oral  QID  . piperacillin-tazobactam  2.25 g Intravenous Q6H  . sodium chloride  3 mL Intravenous Q12H  . sodium chloride  3 mL Intravenous Q12H  . thiamine  100 mg Oral Daily   Or  . thiamine  100 mg Intravenous Daily  . vancomycin  500 mg Intravenous Q24H  . vitamin B-12  250 mcg Oral QPM   Continuous Infusions: . sodium chloride 75 mL/hr at 08/20/13 2230  . sodium chloride     PRN Meds:.sodium chloride, acetaminophen, promethazine, promethazine, sodium chloride  Assessment/Plan: 57 year old female with a history of EtOH abuse, chronic pancreatitis,  protein calorie malnutrition, hypocalcemia and hypomagnesemia, HTN, CKD3 who presents to the ED because she was instructed to do so by the [facility] for low magnesium, found to have a pneumonia and sepsis.   #Acute GI bleed - Hemoglobin 5.0 > ~11 s/p 3 units PRBCs. Platelets stable at 133. GI to perform EGD/colonoscopy today. - Transfer out of SDU to telemetry - Appreciate GI recs, Dr. Benson Norway - IVF NS @75cc /hr - Continue NPO for procedure - IV Protonix 40mg  BID - Holding heparin and ASA - Place and maintain SCDs - Monitor CBC q6h, transfusion threshold <7  Hemoglobin  Date Value Ref Range Status  08/21/2013 10.9* 12.0 - 15.0 g/dL Final  08/21/2013 10.8* 12.0 - 15.0 g/dL Final  08/20/2013 11.3* 12.0 - 15.0 g/dL Final  08/20/2013 11.1* 12.0 - 15.0 g/dL Final  08/20/2013 11.2* 12.0 - 15.0 g/dL Final     DELTA CHECK NOTED     POST TRANSFUSION SPECIMEN    #Severe sepsis - Still with intermittent fever, Tmax 101.2 yesterday. Tachycardic to 114. Lactate now normal today at 1.7. Normotensive. Likely source is pneumonia +/- UTI. Blood cultures are all NGTD. - IVF as above - Vanc and zosyn per pharmacy, to cover for MRSA and anaerobes given risk of aspiration  - If spikes a fever again, will continue to repeat blood cultures, could also be 2/2 alcohol withdrawal vs, malignancy? - Tylenol per rectum if needed  #Multifocal pneumonia, CAP vs. aspiration -  CT chest on 3/14 showed ground-glass opacities bilaterally and an area of patchy consolidative changes in the RLL concerning for a multifocal infectious/inflammatory process. She is an alcoholic and has a history of aspiration pneumonia. WBC down trended to normal (9.7). - Treatment as above with Vanc and Zosyn until afebrile for 24 hours - Continuous pulse ox - CBC as above - Checking HIV ab  #UTI - Complained of urinary frequency on admission. UA with many bacteria, rare squams. Urine culture is growing >100K col/ml of E. Coli, pan sensitive (including to Zosyn). - Continue antibiotics as above  #Hypomagnesemia - Mag 1.7 this am. - Mag sulfate 2g IV x1 - NPO given GI bleed - Continue to monitor BMP, mag BID with repletion as needed  Magnesium  Date Value Ref Range Status  08/21/2013 1.7  1.5 - 2.5 mg/dL Final  08/20/2013 2.1  1.5 - 2.5 mg/dL Final  08/20/2013 1.6  1.5 - 2.5 mg/dL Final  08/19/2013 2.1  1.5 - 2.5 mg/dL Final  08/18/2013 2.9* 1.5 - 2.5 mg/dL Final    #Hypophosphatemia - We are monitoring daily phosphorus due to risk of refeeding syndrome. Today it is normal. We were giving oral K phos neutral, but she is now NPO. Given risks of IV phos we will hold off on giving this unless symptomatic or severely deficient. - Continue to monitor phos daily  Phosphorus  Date Value Ref Range Status  08/21/2013 3.5  2.3 - 4.6 mg/dL Final  08/20/2013 3.8  2.3 - 4.6 mg/dL Final  08/19/2013 1.7* 2.3 - 4.6 mg/dL Final  08/18/2013 1.2* 2.3 - 4.6 mg/dL Final  08/17/2013 1.6* 2.3 - 4.6 mg/dL Final    #Acute on chronic normocytic anemia - Baseline hemoglobin 8.8-10.7. She has a history of B12 deficiency and folate deficiency causing macrocytic anemia. She is also on iron supplementation for a history of iron deficiency anemia. See GI bleed above for further discussion. 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  - Holding home calcium carbonate 500mg  6 tabs (1200 mg elemental calcium) 3 times a day given NPO  #Alcohol abuse - CIWA 0 overnight. No Ativan given. She remains tachycardic with intermittent fevers. - Continue CIWA protocol  - Folate, thiamine, multivitamin as above (holding as NPO)  #Severe protein calorie malnutrition - Albumin now 1.4. She admits to most of her daily po intake being "water and wine."  - Appreciate nutrition recs  - NPO for now but once able to eat, will give regular, liberalized diet  - Holding Ensure feeding supplements twice a day between meals while NPO - Will refer patient to outpatient Internal Medicine RD at discharge  #CKD3 - Baseline Cr 1.2-1.3 Trended up early this week, but now stable s/p IVF. Suspect pre-renal process. - IVF as above - Continue to monitor   Creatinine, Ser  Date Value Ref Range Status  08/21/2013 1.58* 0.50 - 1.10 mg/dL Final  08/20/2013 1.55* 0.50 - 1.10 mg/dL Final  08/20/2013 1.71* 0.50 - 1.10 mg/dL Final  08/19/2013 1.93* 0.50 - 1.10 mg/dL Final  08/18/2013 1.60* 0.50 - 1.10 mg/dL Final    #Chronic anion gap metabolic acidosis - AG 15, stable. ABG below from 3/14 shows primary metabolic acidosis, with increased anion gap,with full respiratory compensation, likely 2/2 lactic acidosis. UA negative for ketones.  - Treating sepsis as above  - Will repeat ABG if respiratory status compromise  ABG    Component  Value  Date/Time    PHART  7.324*  08/16/2013 0940    PCO2ART  29.5*  08/16/2013 0940    PO2ART  73.8*  08/16/2013 0940    HCO3  14.9*  08/16/2013 0940    TCO2  15.7  08/16/2013 0940    ACIDBASEDEF  9.9*  08/16/2013 0940    O2SAT  93.2  08/16/2013 0940    #Chronic pancreatitis - Stable. Patient denies nausea, vomiting, diarrhea.  - Holding home Creon while  NPO  #Depression - Apathy and blunted affect noted on exam.  - Holding Prozac 10 mg daily while NPO  #HTN - Currently normotensive. - Holding home amlodipine 5 mg daily  - Holding home Lasix 20 mg daily  #Tachycardia - A chronic issue per chart review, but now with fever, lactic acidosis, hypotension. - Resuscitation as above - Continue to monitor on telemetry   #GERD - PPI as above.  #Neuropathic pain/anxiety - Holding home gabapentin 600 mg 3 times a day while NPO  #DVT PPX - SCDs  Dispo: Disposition is deferred at this time, awaiting improvement of current medical problems.  Anticipated discharge in approximately 1-2 day(s).   The patient does have a current PCP Otho Bellows, MD) and does need an [facility] hospital follow-up appointment after discharge.  The patient does not have transportation limitations that hinder transportation to clinic appointments.  .Services Needed at time of discharge: Y = Yes, Blank = No PT:   OT:   RN:   Equipment:   Other:     LOS: 6 days   Lesly Dukes, MD 08/21/2013, 12:29 PM  Lesly Dukes, MD  [email or web address] Pager # [phone] Office # [phone]

## 2013-08-21 NOTE — Progress Notes (Signed)
  Date: 08/21/2013  Patient name: [name] [name]  Medical record number: [id number]  Date of birth: Mar 17, 1956   This patient has been seen and the plan of care was discussed with the house staff. Please see their note for complete details. I concur with their findings with the following additions/corrections: Now reaching almost 24 hours without fever, will monitor. EGD/colo today. She looks clinically better. Watching H/H, no further bleeding. Continue IV abx therapy until afebrile for 24-48 hrs. Follow BC. Will ultimately convert to cover MRSA and aspiration causes.  She has significant Mg wasting. Suspect EtOH effect on RT. With abstinence, may improve in weeks to months. Continue oral supplementation.  Will need outpatient follow up for evaluation of +AMA. I suspect this patient has chronic alcoholism, chronic pancreatitis and malabsorption leading to her weight loss.   Dominic Pea, DO, [facility] 08/21/2013, 2:15 PM

## 2013-08-21 NOTE — Op Note (Signed)
[facility] [place], [id number]   OPERATIVE PROCEDURE REPORT  PATIENT: [name] [name], [name] [name]  MR#: [id number] BIRTHDATE: Oct 03, 1955  GENDER: Female ENDOSCOPIST: Carol Ada, MD ASSISTANT:   William Dalton, technician Carmie End, RN PROCEDURE DATE: 08/21/2013 PROCEDURE:   Colonoscopy ASA CLASS:   Class III INDICATIONS: Hematochezia MEDICATIONS: See the EGD report  DESCRIPTION OF PROCEDURE:   After the risks benefits and alternatives of the procedure were thoroughly explained, informed consent was obtained.  A digital rectal exam revealed no abnormalities of the rectum.    The     endoscope was introduced through the anus  and advanced to the terminal ileum which was intubated for a short distance , No adverse events experienced. The quality of the prep was good. .  The instrument was then slowly withdrawn as the colon was fully examined.     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  The left side of the colon a few scattered diverticula were identified.  No evidence of any polyps, masses, inflammation, vascular abnormalities, ulcerations, or erosions.   Retroflexed views revealed internal/external hemorrhoids.     The scope was then withdrawn from the patient and the procedure terminated.  COMPLICATIONS: There were no complications.  IMPRESSION: 1) Intact Ileocolonic anastamosis. 2) Int/Ext Hemorrhoids.  RECOMMENDATIONS: 1) See the EGD findings and recommendations.  _______________________________ eSignedCarol Ada, MD 08/21/2013 2:58 PM

## 2013-08-21 NOTE — Progress Notes (Signed)
Subjective: Patient seen and examined at the bedside this morning. Yesterday afternoon she had a fever of 101.2. Blood pressure stable. No bloody BMs, hematemesis, or hemoptysis. No complaints except some nausea overnight. EGD/colonoscopy today.  Objective: Vital signs in last 24 hours: Filed Vitals:   08/20/13 2348 08/21/13 0337 08/21/13 0700 08/21/13 1100  BP: 111/77 145/96    Pulse: 109 114    Temp: 99.6 F (37.6 C) 98.8 F (37.1 C) 99.6 F (37.6 C) 98.3 F (36.8 C)  TempSrc: Oral Oral Oral Oral  Resp: 16 22    Height:      Weight:      SpO2: 97% 96%     Weight change:   Intake/Output Summary (Last 24 hours) at 08/21/13 1229 Last data filed at 08/21/13 0600  Gross per 24 hour  Intake   2825 ml  Output      0 ml  Net   2825 ml   Vitals reviewed. General: resting in bed, in NAD, appears malnourished HEENT: no scleral icterus Cardiac: tachycardia, no rubs, murmurs or gallops Pulm: Rales in middle and lower lung fields with R greater than L, no rhonchi or wheezing Abd: soft, nontender, nondistended, BS present Ext: warm and well perfused, no pedal edema Neuro: alert and oriented X3, moves all extremities voluntarily, no tremors or asterixis  Lab Results: Basic Metabolic Panel:  Recent Labs Lab 08/20/13 0240 08/20/13 1720 08/21/13 0230  NA 139 134* 135*  K 4.3 4.5 4.2  CL 105 100 103  CO2 19 18* 19  GLUCOSE 82 88 114*  BUN 21 18 16   CREATININE 1.71* 1.55* 1.58*  CALCIUM 7.2* 7.3* 7.3*  MG 1.6 2.1 1.7  PHOS 3.8  --  3.5   Liver Function Tests:  Recent Labs Lab 08/19/13 0632 08/20/13 0240  AST 38* 36  ALT 12 11  ALKPHOS 255* 230*  BILITOT 0.9 1.7*  PROT 5.9* 5.8*  ALBUMIN 1.5* 1.4*    Recent Labs Lab 08/15/13 1038  LIPASE 8*   CBC:  Recent Labs Lab 08/15/13 1038  08/21/13 0230 08/21/13 0912  WBC 6.3  < > 9.7 7.6  NEUTROABS 3.7  --   --   --   HGB 8.0*  < > 10.8* 10.9*  HCT 23.3*  < > 30.4* 31.0*  MCV 98.7  < > 89.9 91.2  PLT 148*   < > 133* 135*  < > = values in this interval not displayed. Urine Drug Screen: Drugs of Abuse     Component Value Date/Time   LABOPIA NONE DETECTED 08/16/2013 0041   LABOPIA NEG 09/18/2011 0936   COCAINSCRNUR NONE DETECTED 08/16/2013 0041   COCAINSCRNUR NEG 09/18/2011 0936   LABBENZ NONE DETECTED 08/16/2013 0041   LABBENZ NEG 09/18/2011 0936   LABBENZ NEG 04/10/2011 1130   AMPHETMU NONE DETECTED 08/16/2013 0041   AMPHETMU NEG 04/10/2011 1130   THCU NONE DETECTED 08/16/2013 0041   LABBARB NONE DETECTED 08/16/2013 0041   LABBARB NEG 09/18/2011 0936    Alcohol Level:  Recent Labs Lab 08/15/13 1038  ETH 169*   Urinalysis:  Recent Labs Lab 08/16/13 0041  COLORURINE YELLOW  LABSPEC 1.013  PHURINE 5.5  GLUCOSEU NEGATIVE  HGBUR NEGATIVE  BILIRUBINUR NEGATIVE  KETONESUR NEGATIVE  PROTEINUR 100*  UROBILINOGEN 0.2  NITRITE NEGATIVE  LEUKOCYTESUR NEGATIVE    Micro Results: Recent Results (from the past 240 hour(s))  URINE CULTURE     Status: None   Collection Time    08/16/13 12:41 AM  Result Value Ref Range Status   Specimen Description URINE, RANDOM   Final   Special Requests ADD [phone]   Final   Culture  Setup Time     Final   Value: 08/16/2013 14:10     Performed at [facility] Count     Final   Value: >=100,000 COLONIES/ML     Performed at [facility]   Culture     Final   Value: ESCHERICHIA COLI     Performed at [facility]   Report Status 08/18/2013 FINAL   Final   Organism ID, Bacteria ESCHERICHIA COLI   Final  CULTURE, BLOOD (ROUTINE X 2)     Status: None   Collection Time    08/16/13  2:20 AM      Result Value Ref Range Status   Specimen Description BLOOD LEFT HAND   Final   Special Requests BOTTLES DRAWN AEROBIC ONLY 10 CC   Final   Culture  Setup Time     Final   Value: 08/16/2013 12:16     Performed at [facility]   Culture     Final   Value:        BLOOD CULTURE RECEIVED NO GROWTH TO DATE CULTURE  WILL BE HELD FOR 5 DAYS BEFORE ISSUING A FINAL NEGATIVE REPORT     Performed at [facility]   Report Status PENDING   Incomplete  CULTURE, BLOOD (ROUTINE X 2)     Status: None   Collection Time    08/16/13  2:24 AM      Result Value Ref Range Status   Specimen Description BLOOD LEFT HAND   Final   Special Requests BOTTLES DRAWN AEROBIC ONLY 10 CC   Final   Culture  Setup Time     Final   Value: 08/16/2013 12:16     Performed at [facility]   Culture     Final   Value:        BLOOD CULTURE RECEIVED NO GROWTH TO DATE CULTURE WILL BE HELD FOR 5 DAYS BEFORE ISSUING A FINAL NEGATIVE REPORT     Performed at [facility]   Report Status PENDING   Incomplete  CLOSTRIDIUM DIFFICILE BY PCR     Status: None   Collection Time    08/19/13  4:25 AM      Result Value Ref Range Status   C difficile by pcr NEGATIVE  NEGATIVE Final  CULTURE, BLOOD (ROUTINE X 2)     Status: None   Collection Time    08/19/13  8:26 AM      Result Value Ref Range Status   Specimen Description BLOOD RIGHT HAND   Final   Special Requests BOTTLES DRAWN AEROBIC ONLY 5CC   Final   Culture  Setup Time     Final   Value: 08/19/2013 12:41     Performed at [facility]   Culture     Final   Value:        BLOOD CULTURE RECEIVED NO GROWTH TO DATE CULTURE WILL BE HELD FOR 5 DAYS BEFORE ISSUING A FINAL NEGATIVE REPORT     Performed at [facility]   Report Status PENDING   Incomplete  CULTURE, BLOOD (ROUTINE X 2)     Status: None   Collection Time    08/19/13  8:32 AM      Result Value Ref Range Status   Specimen Description  BLOOD RIGHT HAND   Final   Special Requests BOTTLES DRAWN AEROBIC ONLY [facility]   Final   Culture  Setup Time     Final   Value: 08/19/2013 12:41     Performed at [facility]   Culture     Final   Value:        BLOOD CULTURE RECEIVED NO GROWTH TO DATE CULTURE WILL BE HELD FOR 5 DAYS BEFORE ISSUING A FINAL NEGATIVE REPORT     Performed at [facility]   Report Status PENDING   Incomplete  MRSA PCR SCREENING     Status: None   Collection Time    08/19/13  4:00 PM      Result Value Ref Range Status   MRSA by PCR NEGATIVE  NEGATIVE Final   Comment:            The GeneXpert MRSA Assay (FDA     approved for NASAL specimens     only), is one component of a     comprehensive MRSA colonization     surveillance program. It is not     intended to diagnose MRSA     infection nor to guide or     monitor treatment for     MRSA infections.   Studies/Results: Dg Chest Port 1 View  08/19/2013   CLINICAL DATA:  Evaluate central line placement  EXAM: PORTABLE CHEST - 1 VIEW  COMPARISON:  Chest x-ray of 08/15/2013 and CT chest of 08/16/2013  FINDINGS: There is little change to perhaps slight worsening of airspace disease of the lung bases right greater than left most consistent with and inflammatory or infectious process. A small right effusion cannot be excluded. Mediastinal contours are stable and heart size is stable. A right IJ central venous line is present with the tip overlying the expected SVC -RA junction.  IMPRESSION: 1. Right IJ central venous line tip overlies the expected SVC -RA junction. 2. Probable slight worsening of airspace disease at the bases right greater than left.   Electronically Signed   By: Ivar Drape M.D.   On: 08/19/2013 13:09   Medications: I have reviewed the patient's current medications. Scheduled Meds: . calcium carbonate  6 tablet Oral TID  . feeding supplement (ENSURE COMPLETE)  237 mL Oral TID BM  . ferrous sulfate  325 mg Oral BID WC  . FLUoxetine  10 mg Oral Daily  . folic acid  1 mg Oral Daily  . gabapentin  600 mg Oral TID  . lipase/protease/amylase  1 capsule Oral TID AC  . magnesium oxide  400 mg Oral QID  . magnesium sulfate 1 - 4 g bolus IVPB  2 g Intravenous Once  . multivitamin with minerals  1 tablet Oral Daily  . pantoprazole (PROTONIX) IV  40 mg Intravenous Q12H  . phosphorus  250 mg Oral  QID  . piperacillin-tazobactam  2.25 g Intravenous Q6H  . sodium chloride  3 mL Intravenous Q12H  . sodium chloride  3 mL Intravenous Q12H  . thiamine  100 mg Oral Daily   Or  . thiamine  100 mg Intravenous Daily  . vancomycin  500 mg Intravenous Q24H  . vitamin B-12  250 mcg Oral QPM   Continuous Infusions: . sodium chloride 75 mL/hr at 08/20/13 2230  . sodium chloride     PRN Meds:.sodium chloride, acetaminophen, promethazine, promethazine, sodium chloride  Assessment/Plan: 58 year old female with a history of EtOH abuse, chronic pancreatitis,  protein calorie malnutrition, hypocalcemia and hypomagnesemia, HTN, CKD3 who presents to the ED because she was instructed to do so by the [facility] for low magnesium, found to have a pneumonia and sepsis.   #Acute GI bleed - Hemoglobin 5.0 > ~11 s/p 3 units PRBCs. Platelets stable at 133. GI to perform EGD/colonoscopy today. - Transfer out of SDU to telemetry - Appreciate GI recs, Dr. Benson Norway - IVF NS @75cc /hr - Continue NPO for procedure - IV Protonix 40mg  BID - Holding heparin and ASA - Place and maintain SCDs - Monitor CBC q6h, transfusion threshold <7  Hemoglobin  Date Value Ref Range Status  08/21/2013 10.9* 12.0 - 15.0 g/dL Final  08/21/2013 10.8* 12.0 - 15.0 g/dL Final  08/20/2013 11.3* 12.0 - 15.0 g/dL Final  08/20/2013 11.1* 12.0 - 15.0 g/dL Final  08/20/2013 11.2* 12.0 - 15.0 g/dL Final     DELTA CHECK NOTED     POST TRANSFUSION SPECIMEN    #Severe sepsis - Still with intermittent fever, Tmax 101.2 yesterday. Tachycardic to 114. Lactate now normal today at 1.7. Normotensive. Likely source is pneumonia +/- UTI. Blood cultures are all NGTD. - IVF as above - Vanc and zosyn per pharmacy, to cover for MRSA and anaerobes given risk of aspiration  - If spikes a fever again, will continue to repeat blood cultures, could also be 2/2 alcohol withdrawal vs, malignancy? - Tylenol per rectum if needed  #Multifocal pneumonia, CAP vs. aspiration -  CT chest on 3/14 showed ground-glass opacities bilaterally and an area of patchy consolidative changes in the RLL concerning for a multifocal infectious/inflammatory process. She is an alcoholic and has a history of aspiration pneumonia. WBC down trended to normal (9.7). - Treatment as above with Vanc and Zosyn until afebrile for 24 hours - Continuous pulse ox - CBC as above - Checking HIV ab  #UTI - Complained of urinary frequency on admission. UA with many bacteria, rare squams. Urine culture is growing >100K col/ml of E. Coli, pan sensitive (including to Zosyn). - Continue antibiotics as above  #Hypomagnesemia - Mag 1.7 this am. - Mag sulfate 2g IV x1 - NPO given GI bleed - Continue to monitor BMP, mag BID with repletion as needed  Magnesium  Date Value Ref Range Status  08/21/2013 1.7  1.5 - 2.5 mg/dL Final  08/20/2013 2.1  1.5 - 2.5 mg/dL Final  08/20/2013 1.6  1.5 - 2.5 mg/dL Final  08/19/2013 2.1  1.5 - 2.5 mg/dL Final  08/18/2013 2.9* 1.5 - 2.5 mg/dL Final    #Hypophosphatemia - We are monitoring daily phosphorus due to risk of refeeding syndrome. Today it is normal. We were giving oral K phos neutral, but she is now NPO. Given risks of IV phos we will hold off on giving this unless symptomatic or severely deficient. - Continue to monitor phos daily  Phosphorus  Date Value Ref Range Status  08/21/2013 3.5  2.3 - 4.6 mg/dL Final  08/20/2013 3.8  2.3 - 4.6 mg/dL Final  08/19/2013 1.7* 2.3 - 4.6 mg/dL Final  08/18/2013 1.2* 2.3 - 4.6 mg/dL Final  08/17/2013 1.6* 2.3 - 4.6 mg/dL Final    #Acute on chronic normocytic anemia - Baseline hemoglobin 8.8-10.7. She has a history of B12 deficiency and folate deficiency causing macrocytic anemia. She is also on iron supplementation for a history of iron deficiency anemia. See GI bleed above for further discussion. 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  - Holding home calcium carbonate 500mg  6 tabs (1200 mg elemental calcium) 3 times a day given NPO  #Alcohol abuse - CIWA 0 overnight. No Ativan given. She remains tachycardic with intermittent fevers. - Continue CIWA protocol  - Folate, thiamine, multivitamin as above (holding as NPO)  #Severe protein calorie malnutrition - Albumin now 1.4. She admits to most of her daily po intake being "water and wine."  - Appreciate nutrition recs  - NPO for now but once able to eat, will give regular, liberalized diet  - Holding Ensure feeding supplements twice a day between meals while NPO - Will refer patient to outpatient Internal Medicine RD at discharge  #CKD3 - Baseline Cr 1.2-1.3 Trended up early this week, but now stable s/p IVF. Suspect pre-renal process. - IVF as above - Continue to monitor   Creatinine, Ser  Date Value Ref Range Status  08/21/2013 1.58* 0.50 - 1.10 mg/dL Final  08/20/2013 1.55* 0.50 - 1.10 mg/dL Final  08/20/2013 1.71* 0.50 - 1.10 mg/dL Final  08/19/2013 1.93* 0.50 - 1.10 mg/dL Final  08/18/2013 1.60* 0.50 - 1.10 mg/dL Final    #Chronic anion gap metabolic acidosis - AG 15, stable. ABG below from 3/14 shows primary metabolic acidosis, with increased anion gap,with full respiratory compensation, likely 2/2 lactic acidosis. UA negative for ketones.  - Treating sepsis as above  - Will repeat ABG if respiratory status compromise  ABG    Component  Value  Date/Time    PHART  7.324*  08/16/2013 0940    PCO2ART  29.5*  08/16/2013 0940    PO2ART  73.8*  08/16/2013 0940    HCO3  14.9*  08/16/2013 0940    TCO2  15.7  08/16/2013 0940    ACIDBASEDEF  9.9*  08/16/2013 0940    O2SAT  93.2  08/16/2013 0940    #Chronic pancreatitis - Stable. Patient denies nausea, vomiting, diarrhea.  - Holding home Creon while  NPO  #Depression - Apathy and blunted affect noted on exam.  - Holding Prozac 10 mg daily while NPO  #HTN - Currently normotensive. - Holding home amlodipine 5 mg daily  - Holding home Lasix 20 mg daily  #Tachycardia - A chronic issue per chart review, but now with fever, lactic acidosis, hypotension. - Resuscitation as above - Continue to monitor on telemetry   #GERD - PPI as above.  #Neuropathic pain/anxiety - Holding home gabapentin 600 mg 3 times a day while NPO  #DVT PPX - SCDs  Dispo: Disposition is deferred at this time, awaiting improvement of current medical problems.  Anticipated discharge in approximately 1-2 day(s).   The patient does have a current PCP Otho Bellows, MD) and does need an [facility] hospital follow-up appointment after discharge.  The patient does not have transportation limitations that hinder transportation to clinic appointments.  .Services Needed at time of discharge: Y = Yes, Blank = No PT:   OT:   RN:   Equipment:   Other:     LOS: 6 days   Lesly Dukes, MD 08/21/2013, 12:29 PM  Lesly Dukes, MD  [email or web address] Pager # [phone] Office # [phone]

## 2013-08-21 NOTE — Interval H&P Note (Signed)
History and Physical Interval Note:  08/21/2013 1:33 PM  [name] [name]  has presented today for surgery, with the diagnosis of GI bleed  The various methods of treatment have been discussed with the patient and family. After consideration of risks, benefits and other options for treatment, the patient has consented to  Procedure(s): COLONOSCOPY WITH ESOPHAGOGASTRODUODENOSCOPY (EGD) (Left) as a surgical intervention .  The patient's history has been reviewed, patient examined, no change in status, stable for surgery.  I have reviewed the patient's chart and labs.  Questions were answered to the patient's satisfaction.     [name] D

## 2013-08-21 NOTE — Progress Notes (Signed)
Patient, belongings, and medication transferred to 3w09. Received by RN. Patient settled and resting comfortably.  Lum Babe, RN

## 2013-08-21 NOTE — Op Note (Signed)
[facility] [place], [id number]   OPERATIVE PROCEDURE REPORT  PATIENT: [name] [name], [name] [name]  MR#: [id number] BIRTHDATE: March 14, 1956  GENDER: Female ENDOSCOPIST: Carol Ada, MD ASSISTANT:   William Dalton, technician and Carmie End, RN PROCEDURE DATE: 08/21/2013 PROCEDURE:   EGD w/ control of bleeding ASA CLASS:   Class III INDICATIONS:Acute post hemorrhagic anemia. MEDICATIONS: Versed 4 mg IV and Fentanyl 37.5 mcg IV TOPICAL ANESTHETIC:   none  DESCRIPTION OF PROCEDURE:   After the risks benefits and alternatives of the procedure were thoroughly explained, informed consent was obtained.  The Pentax Gastroscope [id number]  endoscope was introduced through the mouth  and advanced to the third portion of the duodenum Without limitations.      The instrument was slowly withdrawn as the mucosa was fully examined.    FINDINGS: The esophagus was normal.  In the gastric body a bleeding site was identified.  There was evidence of fresh blood and hematin.  Around this possible AVM the mucosa was extremely friable.  This site was ablated with an APC.  In the distal portion of the second portion of the duodenum, a nonbleeding AVM was found as well as another possible AVM.  Both sites were successfully ablated with APC.  No evidence of any ulceration, erosions, or significant inflammation.          The scope was then withdrawn from the patient and the procedure terminated.  COMPLICATIONS: There were no complications.  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  I suspect this is the bleeding site that was augmented with the use of anticoagulation. 2) Duodenal AVM(s).  RECOMMENDATIONS: 1) Follow HGB and transfuse as necessary. 2) PPI QD. 3) Signing off.  _______________________________ Lorrin MaisCarol Ada, MD 08/21/2013 3:03 PM

## 2013-08-22 ENCOUNTER — Encounter (HOSPITAL_COMMUNITY): Payer: Self-pay | Admitting: Gastroenterology

## 2013-08-22 ENCOUNTER — Ambulatory Visit: Payer: Self-pay | Admitting: Internal Medicine

## 2013-08-22 LAB — CBC
HCT: 30.1 % — ABNORMAL LOW (ref 36.0–46.0)
Hemoglobin: 10.4 g/dL — ABNORMAL LOW (ref 12.0–15.0)
MCH: 32.1 pg (ref 26.0–34.0)
MCHC: 34.6 g/dL (ref 30.0–36.0)
MCV: 92.9 fL (ref 78.0–100.0)
Platelets: 158 10*3/uL (ref 150–400)
RBC: 3.24 MIL/uL — ABNORMAL LOW (ref 3.87–5.11)
RDW: 17.2 % — ABNORMAL HIGH (ref 11.5–15.5)
WBC: 8.5 10*3/uL (ref 4.0–10.5)

## 2013-08-22 LAB — BASIC METABOLIC PANEL
BUN: 13 mg/dL (ref 6–23)
CHLORIDE: 108 meq/L (ref 96–112)
CO2: 18 mEq/L — ABNORMAL LOW (ref 19–32)
Calcium: 7 mg/dL — ABNORMAL LOW (ref 8.4–10.5)
Creatinine, Ser: 1.57 mg/dL — ABNORMAL HIGH (ref 0.50–1.10)
GFR calc Af Amer: 41 mL/min — ABNORMAL LOW (ref >90)
GFR calc non Af Amer: 36 mL/min — ABNORMAL LOW (ref >90)
Glucose, Bld: 102 mg/dL — ABNORMAL HIGH (ref 70–99)
Potassium: 4 mEq/L (ref 3.7–5.3)
Sodium: 139 mEq/L (ref 137–147)

## 2013-08-22 LAB — CULTURE, BLOOD (ROUTINE X 2)

## 2013-08-22 LAB — PHOSPHORUS: Phosphorus: 4 mg/dL (ref 2.3–4.6)

## 2013-08-22 LAB — MAGNESIUM: MAGNESIUM: 1.9 mg/dL (ref 1.5–2.5)

## 2013-08-22 MED ORDER — LEVOFLOXACIN 500 MG PO TABS: 500.0000 mg | ORAL_TABLET | ORAL | Status: DC

## 2013-08-22 MED ORDER — LEVOFLOXACIN 750 MG PO TABS
750.0000 mg | ORAL_TABLET | Freq: Once | ORAL | Status: AC
  Administered 2013-08-22: 750 mg via ORAL
  Filled 2013-08-22: qty 1

## 2013-08-22 MED ORDER — LEVOFLOXACIN 500 MG PO TABS: 500.0000 mg | ORAL_TABLET | ORAL | Status: AC

## 2013-08-22 MED ORDER — PANTOPRAZOLE SODIUM 40 MG PO TBEC: 40.0000 mg | DELAYED_RELEASE_TABLET | Freq: Every day | ORAL | Status: DC

## 2013-08-22 MED ORDER — ENSURE COMPLETE PO LIQD: 237.0000 mL | Freq: Three times a day (TID) | ORAL | Status: DC

## 2013-08-22 MED ORDER — LEVOFLOXACIN 750 MG PO TABS: 750.0000 mg | ORAL_TABLET | Freq: Every day | ORAL | Status: DC

## 2013-08-22 NOTE — Discharge Summary (Signed)
Name: [name] [name] MRN: [id number] DOB: November 09, 1955 58 y.o. PCP: Otho Bellows, MD  Date of Admission: 08/15/2013  9:09 AM Date of Discharge: 08/22/2013 Attending Physician: Dominic Pea, DO  Discharge Diagnosis: Principal Problem:   Sepsis   GI bleed   HCAP Active Problems:   ALCOHOL ABUSE   Failure to thrive in childhood   Severe protein-calorie malnutrition   Hypomagnesemia  Discharge Medications:   Medication List    STOP taking these medications       amLODipine 10 MG tablet  Commonly known as:  NORVASC     furosemide 20 MG tablet  Commonly known as:  LASIX      TAKE these medications       aspirin EC 81 MG tablet  Take 1 tablet (81 mg total) by mouth every evening.     BD VINYL GLOVES LARGE/X-LARGE Misc  1 Container by Does not apply route once.     calcium carbonate 500 MG chewable tablet  Commonly known as:  TUMS  Chew 6 tablets (1,200 mg of elemental calcium total) by mouth 3 (three) times daily. For bone health     capsaicin 0.025 % cream  Commonly known as:  ZOSTRIX  Apply topically 2 (two) times daily.     cetirizine 10 MG tablet  Commonly known as:  ZYRTEC  Take 1 tablet (10 mg total) by mouth daily.     diclofenac sodium 1 % Gel  Commonly known as:  VOLTAREN  Apply 2 g topically 2 (two) times daily. To affected area     feeding supplement (ENSURE COMPLETE) Liqd  Take 237 mLs by mouth 3 (three) times daily between meals.     ENSURE HIGH PROTEIN PO  Take 1 Can by mouth 3 (three) times daily. chocolate     ferrous sulfate 325 (65 FE) MG tablet  Take 1 tablet (325 mg total) by mouth 2 (two) times daily with a meal.     FLUoxetine 10 MG capsule  Commonly known as:  PROZAC  Take 1 capsule (10 mg total) by mouth daily. For depression     folic acid 1 MG tablet  Commonly known as:  FOLVITE  Take 1 tablet (1 mg total) by mouth daily. For folic acid replacement     gabapentin 300 MG capsule  Commonly known as:  NEURONTIN  Take 2  capsules (600 mg total) by mouth 3 (three) times daily. For anxiety/pain control     levofloxacin 500 MG tablet  Commonly known as:  LEVAQUIN  Take 1 tablet (500 mg total) by mouth every other day. Please take on Sunday 3/22, Tuesday 3/24, and Thursday 3/26  Start taking on:  08/23/2013     lipase/protease/amylase 12000 UNITS Cpep capsule  Commonly known as:  CREON-12/PANCREASE  Take 1 capsule by mouth 3 (three) times daily before meals.     magnesium oxide 400 MG tablet  Commonly known as:  MAG-OX  Take 1 tablet (400 mg total) by mouth 4 (four) times daily.     meclizine 50 MG tablet  Commonly known as:  ANTIVERT  Take 1 tablet (50 mg total) by mouth 3 (three) times daily as needed for dizziness.     multivitamin with minerals Tabs tablet  Take 1 tablet by mouth daily. For vitamin replacement     omeprazole 40 MG capsule  Commonly known as:  PRILOSEC  Take 1 capsule (40 mg total) by mouth daily.     potassium chloride SA 20  MEQ tablet  Commonly known as:  KLOR-CON M20  Take 1 tablet (20 mEq total) by mouth daily. For low potassium     thiamine 100 MG tablet  Commonly known as:  VITAMIN B-1  Take 1 tablet (100 mg total) by mouth daily. For low thiamine     vitamin B-12 250 MCG tablet  Commonly known as:  CYANOCOBALAMIN  Take 1 tablet (250 mcg total) by mouth every evening.     Vitamin D (Ergocalciferol) 50000 UNITS Caps capsule  Commonly known as:  DRISDOL  Take 1 capsule (50,000 Units total) by mouth every 7 (seven) days. Thursdays: For bone health        Disposition and follow-up:   Ms.Ilah K Yowell was discharged from [facility] in Stable condition.  At the hospital follow up visit please address:  1.  Progress towards alcohol cessation. Lung exam improvement? Continued oxygen requirement? Any GI bleeding? Compliance with meds/nutritional supplements, consider bowel regimen if they are constipating her. Compliance with Ensure? BP off  amlodipine/Lasix?  2.  Labs / imaging needed at time of follow-up: BMP, CBC, Mag, Phos, repeat CXR  3.  Pending labs/ test needing follow-up: HIV  Follow-up Appointments: Follow-up Information   Follow up with [place]. (Registered Nurse)    Contact information:   [address] [place] [address] [phone]       Follow up with [place]. (oxygen)    Contact information:   [address] [place] [address] [phone]       Follow up with Dorian Heckle, MD On 08/27/2013. (At 10:45 for hospital follow up, this is your PCP.)    Specialty:  Internal Medicine   Contact information:   [place] [address] [phone]       Follow up with Beryle Beams, MD On 09/01/2013. (At 9:45, this is your GI doctor.)    Specialty:  Gastroenterology   Contact information:   [address] [place] [address] [phone]       Discharge Instructions: Discharge Orders   Future Appointments Provider Department Dept Phone   08/27/2013 10:45 AM Jeralene Huff, MD [place] [phone]   Future Orders Complete By Expires   Call MD for:  difficulty breathing, headache or visual disturbances  As directed    Call MD for:  persistant dizziness or light-headedness  As directed    Call MD for:  severe uncontrolled pain  As directed    Call MD for:  temperature >100.4  As directed    Increase activity slowly  As directed       Consultations: Treatment Team:  Beryle Beams, MD  Procedures Performed:  Dg Chest 2 View  08/15/2013   CLINICAL DATA:  Abnormal right lower chest breath sounds, evaluate for pneumonia  EXAM: CHEST  2 VIEW  COMPARISON:  12/06/2012  FINDINGS: Normal heart size. Increased mild basilar interstitial opacities, more pronounced in the right lower lobe. This can be seen with early edema. Stable heart size. No large effusion or pneumothorax.  Trachea midline. Deformities of the humeral necks bilaterally from prior fractures. Trachea is midline.  IMPRESSION: Asymmetric bibasilar interstitial opacities concerning for minimal basilar edema.   Electronically Signed   By: Daryll Brod M.D.   On: 08/15/2013 13:47   Ct Chest High Resolution  08/16/2013   CLINICAL DATA:  Dyspnea, dry rales in the right base and concordant  radiographic abnormality. Evaluate for interstitial lung disease.  EXAM: CHEST CT WITHOUT CONTRAST  TECHNIQUE: Multidetector CT imaging of the chest was performed following the standard protocol without intravenous contrast. High resolution imaging of the lungs, as well as inspiratory and expiratory imaging, was performed.  COMPARISON:  Chest x-ray obtained earlier today 08/15/2013; most recent prior chest x-ray 12/06/2012  FINDINGS: Mediastinum: Unremarkable CT appearance of the thyroid gland. No suspicious mediastinal or hilar adenopathy. No soft tissue mediastinal mass. The thoracic esophagus is unremarkable.  Heart/Vascular: Limited evaluation in the absence of intravenous contrast material. 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 Conventional 3 vessel arch. The heart is within normal limits for size. No pericardial effusion.  Lungs/Pleura: Mild focal ground-glass attenuation opacity identified in the inferior periphery of the right upper lobe, the inferior aspect of the right middle lobe, and throughout the dependent portion of the right, and to a lesser extent the left lower lobes. Within the right lower lobe disease, there is a suggestion of interlobular septal thickening inferiorly as well as a more patchy consolidative changes in a peribronchovascular distribution. Trace emphysematous changes are noted in the upper lobes at the apices. Tiny subpleural calcified granuloma versus calcified subpleural lymph node in the periphery of the right upper lobe. No suspicious pulmonary mass or nodule. No pleural  effusion or pneumothorax. The upper lungs are essentially clear. Evaluation of the inhalation and exhalation high-resolution CT images demonstrates no definite subpleural reticulation, architectural distortion or evidence of honeycombing to suggest interstitial lung disease.  Bones/Soft Tissues: No acute fracture or aggressive appearing lytic or blastic osseous lesion.  Upper Abdomen: Visualized upper abdomen is unremarkable.  IMPRESSION: 1. Predominantly dependent distribution of ground-glass attenuation airspace disease primarily affecting of the right lower lobe, and to a lesser extent the left lower lobe and inferior aspects of the right upper and middle lobes. Within the right lower lobe there are areas of superimposed patchy consolidative changes in a peribronchovascular distribution. Overall, the findings are most consistent with a multifocal infectious/inflammatory process including multifocal bronchopneumonia, atypical infection, or pneumonitis such as nonspecific interstitial pneumonitis (NSIP) or even aspiration in the appropriate clinical setting. Dependent interstitial edema with developing asymmetric alveolar edema in the right lower lobe is another possibility but considered less likely. 2. Mild biapical and medial upper lung paraseptal emphysema. 3. Mild sequelae of prior granulomatous disease. 4. Mild ectasia of the ascending thoracic aorta. Findings reviewed with the internal medicine team at the workstation at the time of interpretation. This study will also be reviewed by our dedicated Chest radiologists early next week. Any significant addendum to the above interpretation will be communicated to the admitting team.   Electronically Signed   By: Jacqulynn Cadet M.D.   On: 08/16/2013 13:57   Dg Chest Port 1 View  08/19/2013   CLINICAL DATA:  Evaluate central line placement  EXAM: PORTABLE CHEST - 1 VIEW  COMPARISON:  Chest x-ray of 08/15/2013 and CT chest of 08/16/2013  FINDINGS: There is  little change to perhaps slight worsening of airspace disease of the lung bases right greater than left most consistent with and inflammatory or infectious process. A small right effusion cannot be excluded. Mediastinal contours are stable and heart size is stable. A right IJ central venous line is present with the tip overlying the expected SVC -RA junction.  IMPRESSION: 1. Right IJ central venous line tip overlies the expected SVC -RA junction. 2. Probable slight worsening of airspace disease at the  bases right greater than left.   Electronically Signed   By: Ivar Drape M.D.   On: 08/19/2013 13:09    Colonoscopy OPERATIVE PROCEDURE REPORT  PATIENT: Asti, Mackley MR#: [id number]  BIRTHDATE: 01-22-1956 GENDER: Female  ENDOSCOPIST: Carol Ada, MD  ASSISTANT: William Dalton, technician Carmie End, RN  PROCEDURE DATE: 08/21/2013  PROCEDURE: Colonoscopy  ASA CLASS: Class III  INDICATIONS: Hematochezia  MEDICATIONS: See the EGD report  DESCRIPTION OF PROCEDURE: After the risks benefits and  alternatives of the procedure were thoroughly explained, informed  consent was obtained. A digital rectal exam revealed no  abnormalities of the rectum. The endoscope was introduced  through the anus and advanced to the terminal ileum which was  intubated for a short distance , No adverse events experienced.  The quality of the prep was good. . The instrument was then slowly  withdrawn as the colon was fully examined.  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 The left side of  the colon a few scattered diverticula were identified. No evidence  of any polyps, masses, inflammation, vascular abnormalities,  ulcerations, or erosions. Retroflexed views revealed  internal/external hemorrhoids. The scope was then withdrawn  from the patient and the procedure terminated.  COMPLICATIONS: There were no complications.  IMPRESSION:  1) Intact Ileocolonic anastamosis.  2) Int/Ext  Hemorrhoids.   EGD OPERATIVE PROCEDURE REPORT  PATIENT: Batina, Dougan MR#: [id number]  BIRTHDATE: 1955/12/29 GENDER: Female  ENDOSCOPIST: Carol Ada, MD  ASSISTANT: William Dalton, technician and Carmie End, RN  PROCEDURE DATE: 08/21/2013  PROCEDURE: EGD w/ control of bleeding  ASA CLASS: Class III  INDICATIONS:Acute post hemorrhagic anemia.  MEDICATIONS: Versed 4 mg IV and Fentanyl 37.5 mcg IV  TOPICAL ANESTHETIC: none  DESCRIPTION OF PROCEDURE: After the risks benefits and  alternatives of the procedure were thoroughly explained, informed  consent was obtained. The Pentax Gastroscope [id number] endoscope  was introduced through the mouth and advanced to the third portion  of the duodenum Without limitations. The instrument was slowly  withdrawn as the mucosa was fully examined.  FINDINGS: The esophagus was normal. In the gastric body a bleeding  site was identified. There was evidence of fresh blood and  hematin. Around this possible AVM the mucosa was extremely  friable. This site was ablated with an APC. In the distal portion  of the second portion of the duodenum, a nonbleeding AVM was found  as well as another possible AVM. Both sites were successfully  ablated with APC. No evidence of any ulceration, erosions, or  significant inflammation. The scope was then withdrawn  from the patient and the procedure terminated.  COMPLICATIONS: There were no complications.  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 I  suspect this is the bleeding site that was augmented with the use  of anticoagulation.  2) Duodenal AVM(s).  RECOMMENDATIONS:  1) Follow HGB and transfuse as necessary.  2) PPI QD.  3) Signing off.    Admission HPI:  SHEMIA BEVEL is a 58 year old female with a history of EtOH abuse, chronic pancreatitis, protein calorie malnutrition, hypocalcemia and hypomagnesemia, HTN, CKD3 who presents to the ED because she was  instructed to do so by the [facility] for low magnesium.   Patient was seen in the clinic for repeat lab work yesterday, 08/14/2013. CMP showed multiple electrolyte abnormalities including magnesium 0.9, calcium 6.2. She was instructed to immediately go to the emergency room for IV magnesium repletion. She told the procider she would "  think about coming," and subsequently presented today.  The patient tells [place] she feels fine. She denies headache, weakness, numbness, chest pain, palpitations, cough, abdominal pain, nausea, vomiting, diarrhea. She initially tells [place] she has been compliant with her magnesium supplementation 4 times a day, then later states she "sometimes skips days."   She appears emaciated. She has not been needing or drinking well. Her typical meal pattern is drinking "water and wine, sometimes a sandwich." She used to drink Ensure, but tells [place] she ran out. Her last bowel movement was yesterday, it was normal and nonbloody.   She drinks wine everyday. She says she cut her intake down to 3 glasses per day in December. Prior to that time, she drank "all day every day." She denies using other illicit drugs or tobacco products. She tells [place] she gets "sweaty and shaky" when she does not drink. 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   In the ED, she was given 2 g of IV magnesium sulfate.  Physical Exam:  Blood pressure 127/86, pulse 107, temperature 98.2 F (36.8 C), temperature source Oral, resp. rate 19, SpO2 97.00%.  Body mass index is 15.63 kg/(m^2).  Physical Exam  Constitutional: She is oriented to person, place, and time. She appears malnourished. No distress.  HENT:  Head: Normocephalic and atraumatic.  Eyes: Conjunctivae and EOM are normal. Pupils are equal, round, and reactive to light.  Neck: Normal range of motion. Neck supple.  Cardiovascular: Regular rhythm. Tachycardia present. Exam reveals no gallop and no friction rub.  No murmur heard.  Pulmonary/Chest:  Effort normal. No respiratory distress. She has no wheezes. She has rales (Velco-like, high pitched inspiratory breath sounds noted in right lower lung field). She exhibits no tenderness.  Abdominal: Soft. Bowel sounds are normal. She exhibits no distension. There is no tenderness.  Musculoskeletal: Normal range of motion. She exhibits no edema and no tenderness.  Neurological: She is alert and oriented to person, place, and time. No cranial nerve deficit. GCS score is 15.  Skin: Skin is warm and dry.  Psychiatric: She is apathetic. She has a flat affect.    Hospital Course by problem list: 58 year old female with a history of EtOH abuse, chronic pancreatitis, protein calorie malnutrition, hypocalcemia and hypomagnesemia, HTN, CKD3 who presents to the ED because she was instructed to do so by the [facility] for low magnesium, found to have a pneumonia and sepsis.   1) Acute GI bleed  During her hospitalization, the patient had an acute GI bleed. She had one episode of bloody emesis, several episodes of dark stool, and bled from her heparin injection site. Her hemoglobin dropped from 8 on admission to 5. Her heparin and aspirin were held, and she was transitioned to [facility] for VTE prophylaxis, and made NPO. She was given three units of blood, a fluid bolus, and maintenance fluids and transferred to the stepdown unit for a higher level of care. Her CBCs were ordered every six hours and stable at 10-11 after blood products. GI was consulted and decided to do an upper endoscopy and colonoscopy when the patient was medically stable. The colonoscopy showed internal and external hemorrhoids. The upper endoscopy showed gastric body AVM with friable surrounding mucosa along with duodenal AVMs. They suspect that this, in conjunction with anticoagulation, was the source of the bleeding. She was given Protonix BID. At discharge, she was transitioned to her home omeprazole. The patient was scheduled for a follow up  appointment with Dr. Benson Norway.  2) Pneumonia, likely 2/2 aspiration Although the patient did not complain of cough or shortness of breath on admission, she had velcro-like lung sounds in her right lower lung fields on physical exam. A CT of her chest on 3/14 showed ground-glass opacities bilaterally and an area of patchy consolidative changes in the right lower lobe concerning for a multifocal infectious/inflammatory process. She is an alcoholic and has a history of aspiration pneumonia. She was treated with Vancomycin and Zosyn until she was transitioned to oral levofloxacin before discharge. She was placed on continuous pulse oximetry and her CBC was monitored daily. She was given supplemental oxygen. She was placed on heparin for VTE prophylaxis. A PT consult was ordered and they recommended that she be discharged to home with intermittent supervision and supplemental oxygen (4L continuous) and a home health nurse for oxygen checks. This was arranged. She still had crackles on exam upon discharge, but felt back to her usual state of health. Please follow up as an outpatient.  3) UTI - The patient complained of urinary frequency on admission. Her urinalysis showed many bacteria, rare squamous cells. Her urine culture grew >100,000 colonies of E. Coli per mL. This bacteria was pan sensitive, so she was continued on Zosyn as above. The patient repeated no urinary urgency or frequency at discharge.   4)Severe sepsis - The patient had intermittent fever, tachycardia, and an elevated lactate level (max 3.3), so she met severe sepsis criteria. She presented with a UTI and Pneumonia as above, so both etiologies were worked up and treated. Her lactate levels and fever trended downwards during her hospitalization. A blood culture from two sites had no growth. She was given Tylenol as needed.  5) Hypomagnesemia  The patient was admitted from clinic due to hypomagnesemia in the setting of chronic alcohol abuse. The  patient reports non-compliance with her home magnesium (only taking one Mg pill instead of the four prescribed. Her magnesium level was monitored and repleted when needed. FeMag was 20%, higher than expected. 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 She was discharged on her home regimen and we encouraged compliance.  6) Acute on chronic normocytic anemia  The patient has a history of B12 deficiency and folate deficiency causing macrocytic anemia. She is also taking iron supplementation for a history of iron deficiency anemia. See GI bleed above for further discussion. We continued her B12, iron, folate, thiamine, and multivitamin. As above, her hemoglobin stabilized between 10-11 at discharge.   7) Hypophosphatemia  Given the risk of refeeding syndrome in this patient, her phosphorous was monitored daily. She was given oral K phos neutral as needed. Upon discharge her phoshorus was normal and oral repletion was held, please check as an outpatient.  8) Alcohol abuse  The patient has a history of alcohol abuse and presented with an elevated blood alcohol level on admission. She was placed on CIWA protocol and given Ativan as needed. She was very sensitive to this medication. She endorsed some AVH in the first several days of her hospitalization, but did not have withdrawal seizures or DTs. By discharge her CIWAs were 0 and she required no Ativan. The patient was counseled on the dangers of alcohol abuse, the interactions with her medication, and the effect of alcohol on her liver and kidneys. She has a strong support system, and endorsed a strong desire for alcohol abstinence moving forward. She says that she will attend group meetings three times a week.   9) Severe protein calorie malnutrition  The patient admitted to most of her oral daily intake as "water and wine." She was ordered a regular, liberalized diet and encouraged to eat. She was ordered Ensure supplements. She will follow  up with the internal medicine clinic as an outpatient.   10) Chronic Kidney Disease, Stage III  The patient's baseline Creatinine level is 1.2-1.3. It trended up early in her hospitalization, but stabilized at ~1.5 after IV fluids. Please follow up as an outpatient.  11) Depression  The patient was noted to have apathy and blunted affect on admission. Her home fluoxetine was given. On discharge, her affect was full and her mood was greatly improved.   12) Hypertension  The patient was normotensive during hospitalization, so her home hypertension regimen was held. We did not start this back, please monitor as an outpatient.  13) Tachycardia  The patient was tachycardic for the entirety of her hospitalization. Per chart review, this is a a chronic issue. She was monitored on telemetry and volume resuscitated as needed.  14) Chronic aspiration leading to respiratory failure - Patient has a history of chronic aspiration. She was intubated in July 2014 for acute on chronic respiratory failure and again had a significant oxygen requirement this admission. She was ambulated with PT and required 4L at rest and with activity. Home oxygen and home health RN for oxygen checks was arranged.   Discharge Vitals:   BP 115/87  Pulse 99  Temp(Src) 98.8 F (37.1 C) (Oral)  Resp 20  Ht 5' 1"  (1.549 m)  Wt 89 lb (40.37 kg)  BMI 16.83 kg/m2  SpO2 98%  Discharge Labs:  Results for orders placed during the hospital encounter of 08/15/13 (from the past 24 hour(s))  CBC     Status: Abnormal   Collection Time    08/21/13  6:00 PM      Result Value Ref Range   WBC 8.5  4.0 - 10.5 K/uL   RBC 3.49 (*) 3.87 - 5.11 MIL/uL   Hemoglobin 11.2 (*) 12.0 - 15.0 g/dL   HCT 32.1 (*) 36.0 - 46.0 %   MCV 92.0  78.0 - 100.0 fL   MCH 32.1  26.0 - 34.0 pg   MCHC 34.9  30.0 - 36.0 g/dL   RDW 17.1 (*) 11.5 - 15.5 %   Platelets 139 (*) 150 - 400 K/uL  BASIC METABOLIC PANEL     Status: Abnormal   Collection Time     08/21/13  8:00 PM      Result Value Ref Range   Sodium 137  137 - 147 mEq/L   Potassium 3.7  3.7 - 5.3 mEq/L   Chloride 105  96 - 112 mEq/L   CO2 18 (*) 19 - 32 mEq/L   Glucose, Bld 113 (*) 70 - 99 mg/dL   BUN 13  6 - 23 mg/dL   Creatinine, Ser 1.47 (*) 0.50 - 1.10 mg/dL   Calcium 7.2 (*) 8.4 - 10.5 mg/dL   GFR calc non Af Amer 39 (*) >90 mL/min   GFR calc Af Amer 45 (*) >90 mL/min  MAGNESIUM     Status: None   Collection Time    08/21/13  8:00 PM      Result Value Ref Range   Magnesium 2.2  1.5 - 2.5 mg/dL  BASIC METABOLIC PANEL     Status: Abnormal   Collection Time    08/22/13  4:00 AM      Result Value Ref Range   Sodium 139  137 - 147 mEq/L   Potassium 4.0  3.7 - 5.3 mEq/L   Chloride 108  96 - 112 mEq/L   CO2 18 (*) 19 - 32 mEq/L   Glucose, Bld 102 (*) 70 - 99 mg/dL   BUN 13  6 - 23 mg/dL   Creatinine, Ser 1.57 (*) 0.50 - 1.10 mg/dL   Calcium 7.0 (*) 8.4 - 10.5 mg/dL   GFR calc non Af Amer 36 (*) >90 mL/min   GFR calc Af Amer 41 (*) >90 mL/min  MAGNESIUM     Status: None   Collection Time    08/22/13  4:00 AM      Result Value Ref Range   Magnesium 1.9  1.5 - 2.5 mg/dL  PHOSPHORUS     Status: None   Collection Time    08/22/13  4:00 AM      Result Value Ref Range   Phosphorus 4.0  2.3 - 4.6 mg/dL  CBC     Status: Abnormal   Collection Time    08/22/13  8:00 AM      Result Value Ref Range   WBC 8.5  4.0 - 10.5 K/uL   RBC 3.24 (*) 3.87 - 5.11 MIL/uL   Hemoglobin 10.4 (*) 12.0 - 15.0 g/dL   HCT 30.1 (*) 36.0 - 46.0 %   MCV 92.9  78.0 - 100.0 fL   MCH 32.1  26.0 - 34.0 pg   MCHC 34.6  30.0 - 36.0 g/dL   RDW 17.2 (*) 11.5 - 15.5 %   Platelets 158  150 - 400 K/uL    Signed: Lesly Dukes, MD 08/22/2013, 12:08 PM   Time Spent on Discharge: 35 minutes Services Ordered on Discharge: Home health RN Equipment Ordered on Discharge: Home O2 4L continuous

## 2013-08-22 NOTE — Progress Notes (Signed)
  Date: 08/22/2013  Patient name: [name] [name]  Medical record number: [id number]  Date of birth: 1955/08/28   This patient has been seen and the plan of care was discussed with the house staff. Please see their note for complete details. I concur with their findings with the following additions/corrections: Feels well this morning. Noted low grade fever. Clinically, she continues to improve everyday. She denies SOB or CP. 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  Severe sepsis is resolved. Agree with change to Levaquin today, total 14 days tx. Appreciate PT eval. Recs include intermittent supervision. She has O2 desaturation with mobility per documentation. Would benefit from home O2 4 L per min with exertion. She will need [facility] follow up.   Dominic Pea, DO, [facility] 08/22/2013, 10:53 AM

## 2013-08-22 NOTE — Progress Notes (Signed)
Pt discharged to home per MD order. Pt received and reviewed all discharge instructions and medication information follow-up appointments and prescription information. Pt verbalized understanding. Pt central line d/c'd per IV team prior to discharge and telemetry box removed. Pt alert and oriented at discharge with no complaints of pain. Pt escorted to private vehicle via wheelchair by nurse tech. Lenna Sciara

## 2013-08-22 NOTE — Progress Notes (Signed)
08/22/2013 SATURATION QUALIFICATIONS: (This note is used to comply with regulatory documentation for home oxygen)  Patient Saturations on Room Air at Rest = 88%  Patient Saturations on Room Air while Ambulating = 86%  Patient Saturations on 2 Liters of oxygen while Ambulating = 84% Patient Saturations on 2 Liters of oxygen while Ambulating = 86%  Pt's O2 sats increased to greater than 90% with standing rest break on 4 L O2 [place] and pursed lip breathing after 3 mins.  With seated rest break and 4 L O2 [place], pt only took 1-2 mins to recover into the 90s.  Please briefly explain why patient needs home oxygen: Pt desats with mobility.   Barbarann Ehlers [place], [place], DPT [phone]

## 2013-08-22 NOTE — Evaluation (Signed)
Physical Therapy Evaluation Patient Details Name: [name] [name] MRN: [id number] DOB: 1956/01/30 Today's Date: 08/22/2013 Time: 1517-6160 PT Time Calculation (min): 33 min  PT Assessment / Plan / Recommendation History of Present Illness  58 y.o. female admitted to [facility] on 08/15/13 with low magnesium levels (she came straight from the [place]).  Pt also found to have during her hospital course: anemia due to GIB (s/p 3 units of PRBCs and s/p EGD), sepsis due to PNA and UTI.  Pt is currently on 4 L O2 [place] (on eval) and reports she has never used O2 at home before.  PMHx significant for CKDIII, alcohol abuse, cirrhosis of the liver, chronic anemia, pancreatitis, humerus fx (chart did not list which side).    Clinical Impression  Pt is progressing well with her mobility, however, her O2 sats drop both on RA and with 2-4 L O2 [place] without getting SOB.  She would benefit from PT to follow actuely, try a cane (which is her normal community AD) and progress mobility while monitoring O2 sats.  Because pt is independent on her feet she will not need f/u after d/c with PT, but would benefit from home O2 and RN to help monitor O2 sats with mobility at home (for a hopeful goal of eventually weaning back to RA after the PNA gets better).    PT Assessment  Patient needs continued PT services    Follow Up Recommendations  No PT follow up;Supervision - Intermittent    Does the patient have the potential to tolerate intense rehabilitation     NA  Barriers to Discharge  None, sounds like she has good support at home.       Equipment Recommendations  Other (comment) (home oxygen)    Recommendations for Other Services   None  Frequency Min 3X/week    Precautions / Restrictions Precautions Precautions: Other (comment) Precaution Comments: monitor O2 sats with mobility both with and without O2.  Restrictions Weight Bearing Restrictions: No   Pertinent Vitals/Pain  08/22/13 0920  Vital Signs  Pulse Rate  97  Oxygen Therapy  SpO2 ! 88 % (at rest)  O2 Device None (Room air)    08/22/13 0934  Vital Signs  Pulse Rate ! 106  Oxygen Therapy  SpO2 ! 87 % (while walking)  O2 Device None (Room air)    08/22/13 0935  Vital Signs  Pulse Rate ! 102  Oxygen Therapy  SpO2 ! 86 % (while walking)  O2 Device Nasal cannula  O2 Flow Rate (L/min) 4 L/min    08/22/13 0936  Vital Signs  Pulse Rate 99  Oxygen Therapy  SpO2 98 %  O2 Device Nasal cannula  O2 Flow Rate (L/min) 4 L/min        Mobility  Bed Mobility Overal bed mobility: Independent Transfers Overall transfer level: Independent Equipment used: None Ambulation/Gait Ambulation/Gait assistance: Supervision Ambulation Distance (Feet): 300 Feet Assistive device:  (IV pole) Gait Pattern/deviations: WFL(Within Functional Limits) General Gait Details: Supervision for safety due to this is her first time up walking since admission.  She desats, but is asymptomatic (she reports she does not feel SOB) on RA and on 4 L O2 [place].  Educated about what O2 sats mean and how they effect her health.  See O2 qualifying note and VSS section of PT eval note for further details.         PT Diagnosis: Other (comment) (decreased activity tolerance)  PT Problem List: Decreased activity tolerance;Decreased strength;Cardiopulmonary status limiting  activity PT Treatment Interventions: Gait training;Stair training;Functional mobility training;DME instruction;Therapeutic activities;Therapeutic exercise;Patient/family education     PT Goals(Current goals can be found in the care plan section) Acute Rehab PT Goals Patient Stated Goal: to go home PT Goal Formulation: With patient Time For Goal Achievement: 09/05/13 Potential to Achieve Goals: Good  Visit Information  Last PT Received On: 08/22/13 Assistance Needed: +1 History of Present Illness: 58 y.o. female admitted to [facility] on 08/15/13 with low magnesium levels (she came straight from the [facility]).  Pt also found to have during her hospital course: anemia due to GIB (s/p 3 units of PRBCs and s/p EGD), sepsis due to PNA and UTI.  Pt is currently on 4 L O2 [place] (on eval) and reports she has never used O2 at home before.  PMHx significant for CKDIII, alcohol abuse, cirrhosis of the liver, chronic anemia, pancreatitis, humerus fx (chart did not list which side).         Prior [place] expects to be discharged to:: Private residence Living Arrangements: Spouse/significant other;Other relatives ("friend" and grandbabies- 1 y.o. and 4 y.o. ) Available Help at Discharge: Family;Available 24 hours/day Type of Home: House Home Access: Stairs to enter [employer] of Steps: 4 Entrance Stairs-Rails: None Home Layout: One level Home Equipment: Cane - single point Additional Comments: Does not use O2 at home Prior Function Level of Independence: Independent with assistive device(s) Comments: uses cane PRN for community ambulation.   Communication Communication: No difficulties Dominant Hand: Right    Cognition  Cognition Arousal/Alertness: Awake/alert Behavior During Therapy: WFL for tasks assessed/performed Overall Cognitive Status: Within Functional Limits for tasks assessed    Extremity/Trunk Assessment Upper Extremity Assessment Upper Extremity Assessment: Generalized weakness Lower Extremity Assessment Lower Extremity Assessment: Generalized weakness (muscle atropy, malnutrition listed in the chart) Cervical / Trunk Assessment Cervical / Trunk Assessment: Normal   Balance Balance Overall balance assessment: No apparent balance deficits (not formally assessed)  End of Session PT - End of Session Equipment Utilized During Treatment: Oxygen Activity Tolerance: Patient tolerated treatment well Patient left: in chair;with call bell/phone within reach Nurse Communication: Mobility status;Other (comment) (O2 sats)    Wells Guiles B.  [place], [place], DPT [phone]   08/22/2013, 10:08 AM

## 2013-08-22 NOTE — Discharge Summary (Signed)
  Date: 08/22/2013  Patient name: [name] [name]  Medical record number: [id number]  Date of birth: 1955/10/17   This patient has been seen and the plan of care was discussed with the house staff. Please see their note for complete details. I concur with their findings and plan.  Dominic Pea, DO, [facility] 08/22/2013, 3:26 PM

## 2013-08-22 NOTE — Discharge Instructions (Signed)
It was a pleasure taking care of you. - Please take your antibiotic, Levaquin, on Sunday, Tuesday, and Thursday to complete the course. This is to treat your pneumonia. - Please follow up in the clinic as scheduled above. - You had a GI bleed. Please follow up with Dr. Benson [place] as scheduled above. - Please take all of your vitamin supplements as outlined above. - Please hold your blood pressure medications, Amlodipine and Lasix, until your follow up appointment in the clinic. Your blood pressure was normal here. - We encourage you to keep up the good work with stopping the alcohol. Please call the clinic and let us know if you need additional resources for cessation. - Please take Ensure three times a day between meals to help you gain weight. - If you develop recurrent bloody stools, bloody vomit, worsening shortness of breath, fever, please call the clinic at [phone] or return to the ED.

## 2013-08-22 NOTE — Care Management Note (Signed)
    Page 1 of 2   08/22/2013     10:33:23 AM   CARE MANAGEMENT NOTE 08/22/2013  Patient:  [name] [name], [name] [name]   Account Number:  [id number]  Date Initiated:  08/20/2013  Documentation initiated by:  Marvetta Gibbons  Subjective/Objective Assessment:   Pt admitted  Hypomagnesemia, ETOH abuse,     Action/Plan:   PTA pt lived at home- NCM to follow for d/c needs   Anticipated DC Date:  08/22/2013   Anticipated DC Plan:        DC Planning Services  CM consult      [facility] Choice  [place]   Choice offered to / List presented to:  C-1 Patient   DME arranged  [facility]      DME agency  [place] arranged  HH-1 RN      [place].   Status of service:  Completed, signed off Medicare Important Message given?   (If response is "NO", the following Medicare IM given date fields will be blank) Date Medicare IM given:   Date Additional Medicare IM given:    Discharge Disposition:  [place]  Per UR Regulation:  Reviewed for med. necessity/level of care/duration of stay  If discussed at [place] of Stay Meetings, dates discussed:   08/21/2013    Comments:  08-22-13 [address] RN,BSN [phone] CM did make referral for services and SOC to begin within 24-48 hours post d/c.

## 2013-08-22 NOTE — Progress Notes (Signed)
Subjective: Patient seen and examined at the bedside this morning. Overnight she had a low-grade fever to 100.5. She says she feels fine. Denise cough, chest pain. She tolerated EGD/colonoscopy well. She is amenable to going home this afternoon.  Objective: Vital signs in last 24 hours: Filed Vitals:   08/22/13 0920 08/22/13 0934 08/22/13 0935 08/22/13 0936  BP:      Pulse: 97 106 102 99  Temp:      TempSrc:      Resp:      Height:      Weight:      SpO2: 88% 87% 86% 98%   Weight change:   Intake/Output Summary (Last 24 hours) at 08/22/13 1032 Last data filed at 08/21/13 2245  Gross per 24 hour  Intake    275 ml  Output    725 ml  Net   -450 ml   Vitals reviewed. General: resting in bed, in NAD, appears malnourished HEENT: no scleral icterus Cardiac: tachycardia, no rubs, murmurs or gallops Pulm: Rales in middle and lower lung fields with R greater than L, improving, no rhonchi or wheezing Abd: soft, nontender, nondistended, BS present Ext: warm and well perfused, no pedal edema Neuro: alert and oriented X3, moves all extremities voluntarily, no tremors or asterixis  Lab Results: Basic Metabolic Panel:  Recent Labs Lab 08/21/13 0230 08/21/13 2000 08/22/13 0400  NA 135* 137 139  K 4.2 3.7 4.0  CL 103 105 108  CO2 19 18* 18*  GLUCOSE 114* 113* 102*  BUN 16 13 13   CREATININE 1.58* 1.47* 1.57*  CALCIUM 7.3* 7.2* 7.0*  MG 1.7 2.2 1.9  PHOS 3.5  --  4.0   Liver Function Tests:  Recent Labs Lab 08/19/13 0632 08/20/13 0240  AST 38* 36  ALT 12 11  ALKPHOS 255* 230*  BILITOT 0.9 1.7*  PROT 5.9* 5.8*  ALBUMIN 1.5* 1.4*    Recent Labs Lab 08/15/13 1038  LIPASE 8*   CBC:  Recent Labs Lab 08/15/13 1038  08/21/13 1800 08/22/13 0800  WBC 6.3  < > 8.5 8.5  NEUTROABS 3.7  --   --   --   HGB 8.0*  < > 11.2* 10.4*  HCT 23.3*  < > 32.1* 30.1*  MCV 98.7  < > 92.0 92.9  PLT 148*  < > 139* 158  < > = values in this interval not displayed. Urine Drug  Screen: Drugs of Abuse     Component Value Date/Time   LABOPIA NONE DETECTED 08/16/2013 0041   LABOPIA NEG 09/18/2011 0936   COCAINSCRNUR NONE DETECTED 08/16/2013 0041   COCAINSCRNUR NEG 09/18/2011 0936   LABBENZ NONE DETECTED 08/16/2013 0041   LABBENZ NEG 09/18/2011 0936   LABBENZ NEG 04/10/2011 1130   AMPHETMU NONE DETECTED 08/16/2013 0041   AMPHETMU NEG 04/10/2011 1130   THCU NONE DETECTED 08/16/2013 0041   LABBARB NONE DETECTED 08/16/2013 0041   LABBARB NEG 09/18/2011 0936    Alcohol Level:  Recent Labs Lab 08/15/13 1038  ETH 169*   Urinalysis:  Recent Labs Lab 08/16/13 0041  COLORURINE YELLOW  LABSPEC 1.013  PHURINE 5.5  GLUCOSEU NEGATIVE  HGBUR NEGATIVE  BILIRUBINUR NEGATIVE  KETONESUR NEGATIVE  PROTEINUR 100*  UROBILINOGEN 0.2  NITRITE NEGATIVE  LEUKOCYTESUR NEGATIVE    Micro Results: Recent Results (from the past 240 hour(s))  URINE CULTURE     Status: None   Collection Time    08/16/13 12:41 AM      Result Value Ref  Range Status   Specimen Description URINE, RANDOM   Final   Special Requests ADD [phone]   Final   Culture  Setup Time     Final   Value: 08/16/2013 14:10     Performed at [place]     Final   Value: >=100,000 COLONIES/ML     Performed at [facility]   Culture     Final   Value: ESCHERICHIA COLI     Performed at [facility]   Report Status 08/18/2013 FINAL   Final   Organism ID, Bacteria ESCHERICHIA COLI   Final  CULTURE, BLOOD (ROUTINE X 2)     Status: None   Collection Time    08/16/13  2:20 AM      Result Value Ref Range Status   Specimen Description BLOOD LEFT HAND   Final   Special Requests BOTTLES DRAWN AEROBIC ONLY 10 CC   Final   Culture  Setup Time     Final   Value: 08/16/2013 12:16     Performed at [facility]   Culture     Final   Value: NO GROWTH 5 DAYS     Performed at [facility]   Report Status 08/22/2013 FINAL   Final  CULTURE, BLOOD (ROUTINE X 2)      Status: None   Collection Time    08/16/13  2:24 AM      Result Value Ref Range Status   Specimen Description BLOOD LEFT HAND   Final   Special Requests BOTTLES DRAWN AEROBIC ONLY 10 CC   Final   Culture  Setup Time     Final   Value: 08/16/2013 12:16     Performed at [facility]   Culture     Final   Value: NO GROWTH 5 DAYS     Performed at [facility]   Report Status 08/22/2013 FINAL   Final  CLOSTRIDIUM DIFFICILE BY PCR     Status: None   Collection Time    08/19/13  4:25 AM      Result Value Ref Range Status   C difficile by pcr NEGATIVE  NEGATIVE Final  CULTURE, BLOOD (ROUTINE X 2)     Status: None   Collection Time    08/19/13  8:26 AM      Result Value Ref Range Status   Specimen Description BLOOD RIGHT HAND   Final   Special Requests BOTTLES DRAWN AEROBIC ONLY 5CC   Final   Culture  Setup Time     Final   Value: 08/19/2013 12:41     Performed at [facility]   Culture     Final   Value:        BLOOD CULTURE RECEIVED NO GROWTH TO DATE CULTURE WILL BE HELD FOR 5 DAYS BEFORE ISSUING A FINAL NEGATIVE REPORT     Performed at [facility]   Report Status PENDING   Incomplete  CULTURE, BLOOD (ROUTINE X 2)     Status: None   Collection Time    08/19/13  8:32 AM      Result Value Ref Range Status   Specimen Description BLOOD RIGHT HAND   Final   Special Requests BOTTLES DRAWN AEROBIC ONLY [facility]   Final   Culture  Setup Time     Final   Value: 08/19/2013 12:41     Performed at [facility]  Final   Value:        BLOOD CULTURE RECEIVED NO GROWTH TO DATE CULTURE WILL BE HELD FOR 5 DAYS BEFORE ISSUING A FINAL NEGATIVE REPORT     Performed at [facility]   Report Status PENDING   Incomplete  MRSA PCR SCREENING     Status: None   Collection Time    08/19/13  4:00 PM      Result Value Ref Range Status   MRSA by PCR NEGATIVE  NEGATIVE Final   Comment:            The GeneXpert MRSA Assay (FDA     approved for NASAL  specimens     only), is one component of a     comprehensive MRSA colonization     surveillance program. It is not     intended to diagnose MRSA     infection nor to guide or     monitor treatment for     MRSA infections.   Studies/Results: No results found. Medications: I have reviewed the patient's current medications. Scheduled Meds: . calcium carbonate  6 tablet Oral TID  . feeding supplement (ENSURE COMPLETE)  237 mL Oral TID BM  . ferrous sulfate  325 mg Oral BID WC  . FLUoxetine  10 mg Oral Daily  . folic acid  1 mg Oral Daily  . gabapentin  600 mg Oral TID  . lipase/protease/amylase  1 capsule Oral TID AC  . magnesium oxide  400 mg Oral QID  . multivitamin with minerals  1 tablet Oral Daily  . pantoprazole (PROTONIX) IV  40 mg Intravenous Q12H  . phosphorus  250 mg Oral QID  . piperacillin-tazobactam  2.25 g Intravenous Q6H  . sodium chloride  3 mL Intravenous Q12H  . sodium chloride  3 mL Intravenous Q12H  . thiamine  100 mg Oral Daily   Or  . thiamine  100 mg Intravenous Daily  . vancomycin  500 mg Intravenous Q24H  . vitamin B-12  250 mcg Oral QPM   Continuous Infusions: . sodium chloride 75 mL/hr at 08/22/13 0944   PRN Meds:.sodium chloride, acetaminophen, acetaminophen, promethazine, promethazine, sodium chloride  Assessment/Plan: 58 year old female with a history of EtOH abuse, chronic pancreatitis, protein calorie malnutrition, hypocalcemia and hypomagnesemia, HTN, CKD3 who presents to the ED because she was instructed to do so by the [facility] for low magnesium, found to have a pneumonia and sepsis.   #Acute GI bleed - Hemoglobin stable at ~10-11. Platelets stable at ~130-50s. GI did a colonoscopy yesterday that showed internal and external hemorrhoids. 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 - Appreciate GI recs, Dr. Benson [place] - Continue IVF NS  @75cc /hr - Converting to Protonix 40mg  po QD - Continue holding heparin and ASA - Place and maintain SCDs - Monitor CBC q6h, transfusion threshold <7 - Anticipate medically stable for discharge today with close [facility] follow up  Hemoglobin  Date Value Ref Range Status  08/22/2013 10.4* 12.0 - 15.0 g/dL Final  08/21/2013 11.2* 12.0 - 15.0 g/dL Final  08/21/2013 10.9* 12.0 - 15.0 g/dL Final  08/21/2013 10.8* 12.0 - 15.0 g/dL Final  08/20/2013 11.3* 12.0 - 15.0 g/dL Final    #Severe sepsis, resolved - Still with intermittent low-grade fever but severity is downtrending, Tmax 100.5 overnight yesterday. Tachycardia also improved to 108, she has baseline tachycardia. Lactate downtrended to  normal. Normotensive. Likely source is pneumonia +/- UTI. Blood cultures are all NGTD. She fells well. PT saw the patient and recommended no follow up. She did document desaturation at rest and with ambulation. - Converting antibiotics to Levaquin po daily, planned 14 day course (this is day 7), pharmacy to renally adjust - Home health oxygen order placed - Will arrange for home health RN to help with oxygen - Care management is on board - IVF as above  - Tylenol prn  #Multifocal pneumonia, CAP vs. aspiration - CT chest on 3/14 showed ground-glass opacities bilaterally and an area of patchy consolidative changes in the RLL concerning for a multifocal infectious/inflammatory process. She is an alcoholic and has a history of aspiration pneumonia. WBC has down trended to normal. - Treatment as above - Continuous pulse ox - Follow up HIV ab, still pending  #UTI - Complained of urinary frequency on admission. UA with many bacteria, rare squams. Urine culture is growing >100K col/ml of E. Coli, pan sensitive (including to Zosyn). - Continue antibiotics as above  #Hypomagnesemia - Mag as below. She is wasting magnesium in her urine (FeMag 20%), likely 2/2 tubular dysfunction from alcoholism. - Mag-Ox 400mg  po four  times a day - Continue to monitor daily mag  Magnesium  Date Value Ref Range Status  08/22/2013 1.9  1.5 - 2.5 mg/dL Final  08/21/2013 2.2  1.5 - 2.5 mg/dL Final  08/21/2013 1.7  1.5 - 2.5 mg/dL Final  08/20/2013 2.1  1.5 - 2.5 mg/dL Final  08/20/2013 1.6  1.5 - 2.5 mg/dL Final    #Hypophosphatemia - We are monitoring daily phosphorus due to risk of refeeding syndrome. Today it is normal as below. - K phos neutral 250mg  po four times per day - Continue to monitor phos daily  Phosphorus  Date Value Ref Range Status  08/22/2013 4.0  2.3 - 4.6 mg/dL Final  08/21/2013 3.5  2.3 - 4.6 mg/dL Final  08/20/2013 3.8  2.3 - 4.6 mg/dL Final  08/19/2013 1.7* 2.3 - 4.6 mg/dL Final  08/18/2013 1.2* 2.3 - 4.6 mg/dL Final    #Acute on chronic normocytic anemia - Baseline hemoglobin 8.8-10.7. She has a history of B12 deficiency and folate deficiency causing macrocytic anemia. She is also on iron supplementation for a history of iron deficiency anemia. See GI bleed above for further discussion. - Continue home vitamin B12 250 mcg every evening - Continue home ferrous sulfate 325 mg twice a day with meals - Continue oral folate, thiamine, multivitamin  #Hypocalcemia in the setting of hypoalbuminemia/polyclonal gammopathy, resolved - Corrected calcium is wnl today.  - Continue home calcium carbonate 500mg  6 tabs (1200 mg elemental calcium) 3 times a day  #Alcohol abuse - CIWA 0 overnight. No Ativan given. She remains tachycardic with intermittent fevers. - Continue CIWA protocol  - Folate, thiamine, multivitamin as above  #Severe protein calorie malnutrition - Albumin 1.4. She admits to most of her daily po intake being "water and wine."  - Appreciate nutrition recs  - Regular, liberalized diet  - Ensure feeding supplements twice a day between meals - Will refer patient to outpatient Internal Medicine RD at discharge  #CKD3 - Baseline Cr 1.2-1.3 Trended up early this week, but now stable s/p IVF. Suspect  pre-renal process. - IVF as above - Continue to monitor   Creatinine, Ser  Date Value Ref Range Status  08/22/2013 1.57* 0.50 - 1.10 mg/dL Final  08/21/2013 1.47* 0.50 - 1.10 mg/dL Final  08/21/2013 1.58* 0.50 -  1.10 mg/dL Final  08/20/2013 1.55* 0.50 - 1.10 mg/dL Final  08/20/2013 1.71* 0.50 - 1.10 mg/dL Final    #Chronic anion gap metabolic acidosis, resolved - AG now 13. ABG below from 3/14 shows primary metabolic acidosis, with increased anion gap,with full respiratory compensation, likely 2/2 lactic acidosis. UA negative for ketones.  - Treating sepsis as above  - Will repeat ABG if respiratory status compromise  ABG    Component  Value  Date/Time    PHART  7.324*  08/16/2013 0940    PCO2ART  29.5*  08/16/2013 0940    PO2ART  73.8*  08/16/2013 0940    HCO3  14.9*  08/16/2013 0940    TCO2  15.7  08/16/2013 0940    ACIDBASEDEF  9.9*  08/16/2013 0940    O2SAT  93.2  08/16/2013 0940    #Chronic pancreatitis - Stable. Patient denies nausea, vomiting, diarrhea.  - Continue home Creon  #Depression - Apathy and blunted affect noted on exam.  - Continue Prozac 10 mg daily  #HTN - Currently normotensive on no BP meds. - Holding home amlodipine 5 mg daily  - Holding home Lasix 20 mg daily  #Tachycardia - A chronic issue per chart review. - Resuscitation as above - Continue to monitor on telemetry   #GERD - PPI as above.  #Neuropathic pain/anxiety - Continue home gabapentin 600 mg 3 times a day  #DVT PPX - SCDs  Dispo: Disposition is deferred at this time, awaiting improvement of current medical problems.  Anticipated discharge in approximately 1-2 day(s).   The patient does have a current PCP Otho Bellows, MD) and does need an [facility] hospital follow-up appointment after discharge.  The patient does not have transportation limitations that hinder transportation to clinic appointments.  .Services Needed at time of discharge: Y = Yes, Blank = No PT: No PT follow up;Supervision -  Intermittent  OT:   RN:   Equipment:   Other:     LOS: 7 days   Lesly Dukes, MD 08/22/2013, 10:32 AM  Lesly Dukes, MD  [email or web address] Pager # [phone] Office # [phone]

## 2013-08-25 LAB — CULTURE, BLOOD (ROUTINE X 2)
CULTURE: NO GROWTH
Culture: NO GROWTH

## 2013-08-27 ENCOUNTER — Ambulatory Visit (INDEPENDENT_AMBULATORY_CARE_PROVIDER_SITE_OTHER): Payer: PRIVATE HEALTH INSURANCE | Admitting: Internal Medicine

## 2013-08-27 DIAGNOSIS — N183 Chronic kidney disease, stage 3 unspecified: Secondary | ICD-10-CM

## 2013-08-27 DIAGNOSIS — D890 Polyclonal hypergammaglobulinemia: Secondary | ICD-10-CM

## 2013-08-27 DIAGNOSIS — I10 Essential (primary) hypertension: Secondary | ICD-10-CM

## 2013-08-27 DIAGNOSIS — J189 Pneumonia, unspecified organism: Secondary | ICD-10-CM

## 2013-08-27 DIAGNOSIS — K859 Acute pancreatitis without necrosis or infection, unspecified: Secondary | ICD-10-CM

## 2013-08-27 DIAGNOSIS — D649 Anemia, unspecified: Secondary | ICD-10-CM

## 2013-08-27 DIAGNOSIS — G40909 Epilepsy, unspecified, not intractable, without status epilepticus: Secondary | ICD-10-CM

## 2013-08-27 DIAGNOSIS — E8809 Other disorders of plasma-protein metabolism, not elsewhere classified: Secondary | ICD-10-CM

## 2013-08-27 DIAGNOSIS — F1010 Alcohol abuse, uncomplicated: Secondary | ICD-10-CM

## 2013-08-27 DIAGNOSIS — G894 Chronic pain syndrome: Secondary | ICD-10-CM

## 2013-08-27 DIAGNOSIS — R634 Abnormal weight loss: Secondary | ICD-10-CM

## 2013-08-27 DIAGNOSIS — E559 Vitamin D deficiency, unspecified: Secondary | ICD-10-CM

## 2013-08-27 DIAGNOSIS — E43 Unspecified severe protein-calorie malnutrition: Secondary | ICD-10-CM

## 2013-08-27 LAB — CBC
HCT: 36 % (ref 36.0–46.0)
Hemoglobin: 11.8 g/dL — ABNORMAL LOW (ref 12.0–15.0)
MCH: 31.5 pg (ref 26.0–34.0)
MCHC: 32.8 g/dL (ref 30.0–36.0)
MCV: 96 fL (ref 78.0–100.0)
PLATELETS: 292 10*3/uL (ref 150–400)
RBC: 3.75 MIL/uL — ABNORMAL LOW (ref 3.87–5.11)
RDW: 16.6 % — AB (ref 11.5–15.5)
WBC: 6.4 10*3/uL (ref 4.0–10.5)

## 2013-08-27 MED ORDER — ENSURE COMPLETE PO LIQD: 237.0000 mL | Freq: Three times a day (TID) | ORAL | Status: DC

## 2013-08-27 MED ORDER — DICLOFENAC SODIUM 1 % TD GEL: 2.0000 g | Freq: Two times a day (BID) | TRANSDERMAL | Status: DC

## 2013-08-27 MED ORDER — LISINOPRIL 10 MG PO TABS: 10.0000 mg | ORAL_TABLET | Freq: Every day | ORAL | Status: DC

## 2013-08-27 NOTE — Assessment & Plan Note (Signed)
BP Readings from Last 3 Encounters:  08/27/13 153/90  08/22/13 135/101  08/22/13 135/101    Lab Results  Component Value Date   NA 139 08/22/2013   K 4.0 08/22/2013   CREATININE 1.57* 08/22/2013    Assessment: Blood pressure control: mildly elevated Progress toward BP goal:  deteriorated Comments: amlodipine and lasix held during admission secondary to sepsis, elevated today of meds  Plan: Medications:  given CKD should start ACEi Educational resources provided: brochure;handout;video Self management tools provided:   Other plans: will start lisinopril 10 mg qd, pt states that she has taken this before without side effects, pt to f/u 1 month for bp recheck

## 2013-08-27 NOTE — Assessment & Plan Note (Addendum)
Mg level 1.9 during hospital admission after IV supplementation, will encourage qid MgOx oral supplementation.

## 2013-08-27 NOTE — Patient Instructions (Addendum)
General Instructions:  We have refilled your voltaren gel and Ensure drinks. We have prescribed a new blood pressure medicine today (Lisinopril).  This new medicine will help protect your kidney function. Continue to take your medications as instructed. We will check your blood level today. Follow-up in 1 months with Dr. Eulas Post. Thank you for bringing your medicines today. This helps [place] keep you safe from mistakes.    Treatment Goals:  Goals (1 Years of Data) as of 08/27/13         As of Today As of Today 08/22/13 08/22/13 08/21/13     Blood Pressure    . Blood Pressure < 140/90  153/90 157/92 135/101 115/87 139/98     Lifestyle    . Prevent Falls          . Reduce alcohol intake to X servings per day           Result Component    . HDL > 40          . LDL CALC < 130            Progress Toward Treatment Goals:  Treatment Goal 08/27/2013  Blood pressure deteriorated  Prevent falls -    Self Care Goals & Plans:  Self Care Goal 08/27/2013  Manage my medications take my medicines as prescribed; bring my medications to every visit; refill my medications on time; follow the sick day instructions if I am sick  Monitor my health keep track of my weight  Eat healthy foods eat more vegetables; eat fruit for snacks and desserts; eat foods that are low in salt; eat smaller portions  Be physically active find an activity I enjoy    No flowsheet data found.   Care Management & Community Referrals:  Referral 05/21/2013  Referrals made for care management support (No Data)  Referrals made to community resources -      Lisinopril tablets What is this medicine? LISINOPRIL (lyse IN oh pril) is an ACE inhibitor. This medicine is used to treat high blood pressure and heart failure. It is also used to protect the heart immediately after a heart attack. This medicine may be used for other purposes; ask your health care provider or pharmacist if you have questions. COMMON BRAND NAME(S):  Prinivil, Zestril What should I tell my health care provider before I take this medicine? They need to know if you have any of these conditions: -diabetes -heart or blood vessel disease -immune system disease like lupus or scleroderma -kidney disease -low blood pressure -previous swelling of the tongue, face, or lips with difficulty breathing, difficulty swallowing, hoarseness, or tightening of the throat -an unusual or allergic reaction to lisinopril, other ACE inhibitors, insect venom, foods, dyes, or preservatives -pregnant or trying to get pregnant -breast-feeding How should I use this medicine? Take this medicine by mouth with a glass of water. Follow the directions on your prescription label. You may take this medicine with or without food. Take your medicine at regular intervals. Do not stop taking this medicine except on the advice of your doctor or health care professional. Talk to your pediatrician regarding the use of this medicine in children. Special care may be needed. While this drug may be prescribed for children as young as 81 years of age for selected conditions, precautions do apply. Overdosage: If you think you have taken too much of this medicine contact a poison control center or emergency room at once. NOTE: This medicine is only for you.  Do not share this medicine with others. What if I miss a dose? If you miss a dose, take it as soon as you can. If it is almost time for your next dose, take only that dose. Do not take double or extra doses. What may interact with this medicine? -diuretics -lithium -NSAIDs, medicines for pain and inflammation, like ibuprofen or naproxen -over-the-counter herbal supplements like hawthorn -potassium salts or potassium supplements -salt substitutes This list may not describe all possible interactions. Give your health care provider a list of all the medicines, herbs, non-prescription drugs, or dietary supplements you use. Also tell them  if you smoke, drink alcohol, or use illegal drugs. Some items may interact with your medicine. What should I watch for while using this medicine? Visit your doctor or health care professional for regular check ups. Check your blood pressure as directed. Ask your doctor what your blood pressure should be, and when you should contact him or her. Call your doctor or health care professional if you notice an irregular or fast heart beat. Women should inform their doctor if they wish to become pregnant or think they might be pregnant. There is a potential for serious side effects to an unborn child. Talk to your health care professional or pharmacist for more information. Check with your doctor or health care professional if you get an attack of severe diarrhea, nausea and vomiting, or if you sweat a lot. The loss of too much body fluid can make it dangerous for you to take this medicine. You may get drowsy or dizzy. Do not drive, use machinery, or do anything that needs mental alertness until you know how this drug affects you. Do not stand or sit up quickly, especially if you are an older patient. This reduces the risk of dizzy or fainting spells. Alcohol can make you more drowsy and dizzy. Avoid alcoholic drinks. Avoid salt substitutes unless you are told otherwise by your doctor or health care professional. Do not treat yourself for coughs, colds, or pain while you are taking this medicine without asking your doctor or health care professional for advice. Some ingredients may increase your blood pressure. What side effects may I notice from receiving this medicine? Side effects that you should report to your doctor or health care professional as soon as possible: -abdominal pain with or without nausea or vomiting -allergic reactions like skin rash or hives, swelling of the hands, feet, face, lips, throat, or tongue -dark urine -difficulty breathing -dizzy, lightheaded or fainting spell -fever or sore  throat -irregular heart beat, chest pain -pain or difficulty passing urine -redness, blistering, peeling or loosening of the skin, including inside the mouth -unusually weak -yellowing of the eyes or skin Side effects that usually do not require medical attention (report to your doctor or health care professional if they continue or are bothersome): -change in taste -cough -decreased sexual function or desire -headache -sun sensitivity -tiredness This list may not describe all possible side effects. Call your doctor for medical advice about side effects. You may report side effects to FDA at 1-800-FDA-1088. Where should I keep my medicine? Keep out of the reach of children. Store at room temperature between 15 and 30 degrees C (59 and 86 degrees F). Protect from moisture. Keep container tightly closed. Throw away any unused medicine after the expiration date. NOTE: This sheet is a summary. It may not cover all possible information. If you have questions about this medicine, talk to your doctor, pharmacist, or health  care provider.  2014, Elsevier/Gold Standard. (2007-11-25 17:36:32)

## 2013-08-27 NOTE — Progress Notes (Signed)
   Subjective:    [name], female    DOB: 04-14-1956, 58 y.o.   MRN: [id number]  HPI  Pt is a 58 yo with hx significant for polyclonal gammopathy, alcohol dependence, seizure disorder, chronic pancreatitis, CKD Stage 3, and hypomagnesemia.  She presents for hospital f/u for UGI bleed and sepsis in setting of pneumonia and UTI. Reports compliance with medications.  Completing course of levoquin.  Denies further bleeding, chest pain, shortness of breath but still using 4L O2.  Still has black stools but no frank blood and is currently taking iron supplementation.  Review of Systems  Constitutional: Negative.   HENT: Negative.   Eyes: Negative.   Respiratory: Negative.   Cardiovascular: Negative.        States feet swelled yesterday when she was standing in the house for some time but has gone down  Gastrointestinal: Negative.   Genitourinary: Negative.   Neurological: Negative.   Hematological: Negative.   Psychiatric/Behavioral: Negative.        Objective:   Physical Exam  Constitutional: She is oriented to person, place, and time. She appears well-developed and well-nourished. No distress.  HENT:  Head: Normocephalic and atraumatic.  Eyes: Conjunctivae and EOM are normal. Pupils are equal, round, and reactive to light.  Murky sclera  Neck: Normal range of motion. Neck supple.  Cardiovascular: Normal rate, regular rhythm, normal heart sounds and intact distal pulses.   Pulmonary/Chest: Effort normal and breath sounds normal. She has no wheezes.  Abdominal: Soft. Bowel sounds are normal. There is no tenderness.  Musculoskeletal: She exhibits edema. She exhibits no tenderness.  Trace LE edema Bilaterally  Neurological: She is alert and oriented to person, place, and time.  Skin: Skin is warm and dry.  Psychiatric: She has a normal mood and affect.          Assessment & Plan:  See separate problem-list charting for detailed A/P:  -check cbc -check  hiv -start lisinopril 10 mg qd -cxr on 1 month f/u

## 2013-08-28 LAB — HIV ANTIBODY (ROUTINE TESTING W REFLEX): HIV: NONREACTIVE

## 2013-08-28 NOTE — Assessment & Plan Note (Signed)
On 4L O2 [place].  Denies shortness of breath or chest pain. On levofloxaxin tx. -f/u CXR in 1 month

## 2013-08-28 NOTE — Assessment & Plan Note (Signed)
No acute exacerbation.  Cont Creon.

## 2013-08-28 NOTE — Assessment & Plan Note (Addendum)
No seizures documented on EEG this admission.  Question of pseudoseizures. -cont Keppra 1000 mg bid, f/u Neurology

## 2013-08-28 NOTE — Assessment & Plan Note (Signed)
RX sent for Ensure supplementation.

## 2013-08-28 NOTE — Assessment & Plan Note (Addendum)
States that she is "done" with alcohol secondary to recent scare with past hospitalization. No motivation for AA at this point. Last drink (wine) 3 weeks ago per pt.

## 2013-08-28 NOTE — Assessment & Plan Note (Signed)
Has been referred to [facility]. Pt reports that she "just got a call" from them today to schedule an appointment.   Recent Labs Lab 08/21/13 2000 08/22/13 0400  CREATININE 1.47* 1.57*

## 2013-08-28 NOTE — Assessment & Plan Note (Addendum)
CBC ~ 10 at hospital discharge. -Will recheckHgb level in setting of dark stools although this can be attributed to her iron supplementation.

## 2013-09-02 ENCOUNTER — Other Ambulatory Visit: Payer: Self-pay | Admitting: *Deleted

## 2013-09-04 MED ORDER — MAGNESIUM OXIDE 400 MG PO TABS: 400.0000 mg | ORAL_TABLET | Freq: Four times a day (QID) | ORAL | Status: DC

## 2013-09-18 ENCOUNTER — Ambulatory Visit (INDEPENDENT_AMBULATORY_CARE_PROVIDER_SITE_OTHER): Payer: PRIVATE HEALTH INSURANCE | Admitting: Internal Medicine

## 2013-09-18 ENCOUNTER — Encounter: Payer: Self-pay | Admitting: Internal Medicine

## 2013-09-18 VITALS — BP 131/91 | HR 94 | Temp 98.0°F | Ht 61.0 in | Wt 91.1 lb

## 2013-09-18 DIAGNOSIS — N183 Chronic kidney disease, stage 3 unspecified: Secondary | ICD-10-CM

## 2013-09-18 DIAGNOSIS — D649 Anemia, unspecified: Secondary | ICD-10-CM

## 2013-09-18 DIAGNOSIS — R5381 Other malaise: Secondary | ICD-10-CM

## 2013-09-18 DIAGNOSIS — J189 Pneumonia, unspecified organism: Secondary | ICD-10-CM

## 2013-09-18 MED ORDER — MAGNESIUM OXIDE 400 MG PO TABS: 400.0000 mg | ORAL_TABLET | Freq: Four times a day (QID) | ORAL | Status: DC

## 2013-09-18 NOTE — Progress Notes (Signed)
[name], female   DOB: 1956/05/21, 58 y.o.   MRN: [id number]  Subjective:   [name] female   DOB: 02-10-1956 58 y.o.   MRN: [id number]  HPI: [name] [name] is a 58 y.o. F w/ PMH polyclonal gammopathy, alcohol dependence, seizure disorder, chronic pancreatitis, CKD Stage 3, and hypomagnesemia presents for a routine f/u regarding her hypomagnesemia and HTN.   She was seen for hospital f/u on 3/25 after being admitted for CAP, hypoMg, and an upper GI bleed.   She was sent home on supplemental O2 and continues to use it. She states that the only time she is off the O2 is when she takes a bath. She states that her breathing is normal off the O2 and denies any dyspnea.   Hgb was improved at her last clinic visit. Last Mg was 1.9, and she is currently taking oral supplements.   She has CKD and has been referred to nephrology and saw them yesterday. She states that labs were drawn at that time, including Mg. Results are pending.   She is having generalized weakness since hospital discharge but is feeling better.   Past Medical History  Diagnosis Date  . Anemia, B12 deficiency   . History of acute pancreatitis   . Right knee pain     No recent imaging on chart  . 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 Dr. Mancel Bale, [facility]  . Hypertriglyceridemia   . GERD (gastroesophageal reflux disease)   . Vitamin D deficiency   . Subdural hematoma 02/2008    Likely 2/2 trauma from seizure from EtOH withdrawal, chronic in nature, sees Dr. Jerene Bears. Most recent CT head 10/2009 showing stable but persistent hematoma without mass effect.  . History of seizure disorder     Likely 2/2 alcohol abuse  . Hypocalcemia   . Hypomagnesemia   . Failure to thrive in childhood     Unclear etiology  . HTN (hypertension)   . Thrombocytopenia   . Anemia, macrocytic   . Hepatomegaly     On exam  . Joint  pain   . Alcohol abuse   . Vitamin D deficiency   . Menopause   . Pancreatitis   . Insomnia   . Hyperlipidemia   . Sinusitis   . Pernicious anemia   . Macrocytic anemia   . 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 Depression   . Fx humeral neck 04/17/2011    Transverse fracture- minimally displaced- managed as outpatient   . ABNORMAL PAP SMEAR, LGSIL 07/23/2008    Annotation: HPV positive CIN I Dr. Mancel Bale, [facility] Qualifier: Diagnosis of  By: Oretha Ellis    . Pneumonia 05/20/2012  . Seizures     "last one was in 2013; don't know what kind" (08/15/2013)  . Arthritis     "shoulders" (08/15/2013)   Current Outpatient Prescriptions  Medication Sig Dispense Refill  . aspirin EC 81 MG tablet Take 1 tablet (81 mg total) by mouth every evening.  90 tablet  3  . calcium carbonate (TUMS) 500 MG chewable tablet Chew 6 tablets (1,200 mg of elemental calcium total) by mouth 3 (three) times daily. For bone health  90 tablet  3  . capsaicin (ZOSTRIX) 0.025 % cream Apply topically 2 (two) times daily.  45 g  0  . cetirizine (ZYRTEC) 10 MG tablet Take 1  tablet (10 mg total) by mouth daily.  90 tablet  3  . diclofenac sodium (VOLTAREN) 1 % GEL Apply 2 g topically 2 (two) times daily. To affected area  100 g  0  . Disposable Gloves (BD VINYL GLOVES LARGE/X-LARGE) MISC 1 Container by Does not apply route once.  50 each  0  . feeding supplement, ENSURE COMPLETE, (ENSURE COMPLETE) LIQD Take 237 mLs by mouth 3 (three) times daily between meals.  90 Bottle  1  . ferrous sulfate 325 (65 FE) MG tablet Take 1 tablet (325 mg total) by mouth 2 (two) times daily with a meal.  180 tablet  3  . FLUoxetine (PROZAC) 10 MG capsule Take 1 capsule (10 mg total) by mouth daily. For depression  90 capsule  4  . folic acid (FOLVITE) 1 MG tablet Take 1 tablet (1 mg total) by mouth daily. For folic acid replacement  30 tablet  3  . gabapentin (NEURONTIN) 300 MG capsule Take 2 capsules (600 mg total)  by mouth 3 (three) times daily. For anxiety/pain control  540 capsule  1  . lipase/protease/amylase (CREON-12/PANCREASE) 12000 UNITS CPEP capsule Take 1 capsule by mouth 3 (three) times daily before meals.  270 capsule  3  . lisinopril (PRINIVIL,ZESTRIL) 10 MG tablet Take 1 tablet (10 mg total) by mouth daily.  30 tablet  11  . magnesium oxide (MAG-OX) 400 MG tablet Take 1 tablet (400 mg total) by mouth 4 (four) times daily.  360 tablet  6  . meclizine (ANTIVERT) 50 MG tablet Take 1 tablet (50 mg total) by mouth 3 (three) times daily as needed for dizziness.  30 tablet  0  . Multiple Vitamin (MULTIVITAMIN WITH MINERALS) TABS tablet Take 1 tablet by mouth daily. For vitamin replacement  90 tablet  3  . Nutritional Supplements (ENSURE HIGH PROTEIN PO) Take 1 Can by mouth 3 (three) times daily. chocolate      . omeprazole (PRILOSEC) 40 MG capsule Take 1 capsule (40 mg total) by mouth daily.  90 capsule  4  . potassium chloride SA (KLOR-CON M20) 20 MEQ tablet Take 1 tablet (20 mEq total) by mouth daily. For low potassium  90 tablet  0  . thiamine (VITAMIN B-1) 100 MG tablet Take 1 tablet (100 mg total) by mouth daily. For low thiamine  90 tablet  0  . vitamin B-12 (CYANOCOBALAMIN) 250 MCG tablet Take 1 tablet (250 mcg total) by mouth every evening.  90 tablet  3  . Vitamin D, Ergocalciferol, (DRISDOL) 50000 UNITS CAPS capsule Take 1 capsule (50,000 Units total) by mouth every 7 (seven) days. Thursdays: For bone health  4 capsule  3   No current facility-administered medications for this visit.   Family History  Problem Relation Age of Onset  . Cancer Mother     Died from stomach cancer and "flesh eating rash  . Heart failure Father     Died in 15s from an MI  . Alcohol abuse Sister     Twin sister drinks a lot, as did both her parents and brothers  . Stroke Brother     Has 7 brothers, 1 with CVA   History   Social History  . Marital Status: Divorced    Spouse Name: N/A    Number of  Children: N/A  . Years of Education: N/A   Social History Main Topics  . Smoking status: Former Smoker -- 0.50 packs/day for 40 years    Types: Cigarettes  Quit date: 09/20/2010  . Smokeless tobacco: Never Used  . Alcohol Use: 12.6 oz/week    21 Glasses of wine per week     Comment: 08/15/2013 "~ 3 glasses of wine/day"; pt states drinking a fifth/day  . Drug Use: Yes    Special: Marijuana, Cocaine     Comment: 08/15/2013 "last drug use was in 2012"  . Sexual Activity: Yes   Other Topics Concern  . None   Social History Narrative   Lives with her significant other and 2 grandchildren. 1 child   Has 7 brothers and 4 sisters, 1 twin sister.   Unemployed, worked in [employer].    Abuses alcohol-drinks 1 glass of wine daily    No drug use. Former cigarette use quit 1.5 years ago.     11 th grade education            Review of Systems: A 12 point ROS was performed; pertinent positives and negatives were noted in the HPI   Objective:  Physical Exam: Filed Vitals:   09/18/13 1450  BP: 131/91  Pulse: 94  Temp: 98 F (36.7 C)  TempSrc: Oral  Height: 5\' 1"  (1.549 m)  Weight: 91 lb 1.6 oz (41.323 kg)  SpO2: 100%   Constitutional: Vital signs reviewed.  Patient is a well-developed and well-nourished female in no acute distress and cooperative with exam.   Head: Normocephalic and atraumatic Eyes: PERRL, EOMI Neck: Supple, Trachea midline normal ROM, No JVD, mass, thyromegaly Cardiovascular: RRR, no MRG, pulses symmetric and intact bilaterally Pulmonary/Chest: Normal respiratory effort, CTAB, no wheezes, rales, or rhonchi Abdominal: Soft. Non-tender, non-distended Musculoskeletal: moves all 4 extremities Hematology: No cervical adenopathy.  Neurological: A&O x3, CN II-XII are grossly intact, no focal neurological deficits Skin: Warm, dry and intact.  Psychiatric: Normal mood and affect. speech and behavior is normal.   Assessment & Plan:   Please refer to Problem List  based Assessment and Plan

## 2013-09-18 NOTE — Assessment & Plan Note (Addendum)
Improved Hgb at last clinic visit. Pt currently on iron supplements, will continue. Not rechecking as she just had labs on 4/15 at [facility]. Hgb 11.8 on 08/27/13 from 10.4 on 08/22/13.

## 2013-09-18 NOTE — Progress Notes (Signed)
PSO2 room air ambulation 94 - 97% - Dr Eulas Post aware. Hilda Blades Zipporah Finamore RN 09/18/13 3:30PM

## 2013-09-18 NOTE — Patient Instructions (Signed)
-   Please have your kidney doctor send records to [place].   - Continue to take your Magnesium  - You can stop using the oxygen; your oxygen saturation looks really good, even while walking. If you begin to feel short of breath, please call the clinic.

## 2013-09-18 NOTE — Assessment & Plan Note (Addendum)
Last CXR 3/17. Pt admitted with PNA. She has completed her abx course and is doing well overall. She is still on home O2 at 4 L  But denies any dyspnea off the oxygen. Doing a trial off the O2 while in the clinic, O2 97% at rest off the supplemental O2 for >42min, ambulating, O2 94-97%. She does not require supplemental O2 at this point and can stop using it.  - Will repeat CXR today as she is approx 4 weeks from her PNA diagnosis.

## 2013-09-20 DIAGNOSIS — R5381 Other malaise: Secondary | ICD-10-CM | POA: Insufficient documentation

## 2013-09-20 NOTE — Assessment & Plan Note (Signed)
GFR from 3/20 was 41. Pt reports that she was seen by [facility] on 4/15 and a lot of blood work was performed. She is unable to tell me much more about her visit then that. Will obtain records.

## 2013-09-20 NOTE — Assessment & Plan Note (Addendum)
Pt is physically deconditioned after her PNA and with her longstanding h/o EtOH use, currently abstaining. Her daughter has been with her at home, but is trying to get a home health aide or nurse in to help with [name] [name] care. - SW consult for assistance setting up [place] RN/aide, face-to-face done

## 2013-09-20 NOTE — Assessment & Plan Note (Addendum)
2/2 EtOH use. Pt denies any EtOH use. Last Mg was 1.9 on 3/20. She is currently taking MgOx supplements 400mg  QID. She states that a bunch of labs were drawn by [facility] on 4/15, so will hold off on taking more blood today. Will have to get the results from them or recheck Mg level at her next visit

## 2013-09-21 NOTE — Progress Notes (Signed)
Case discussed with Dr. Glenn soon after the resident saw the patient.  We reviewed the resident's history and exam and pertinent patient test results.  I agree with the assessment, diagnosis, and plan of care documented in the resident's note. 

## 2013-09-22 ENCOUNTER — Telehealth: Payer: Self-pay | Admitting: *Deleted

## 2013-09-22 NOTE — Telephone Encounter (Signed)
Pt stopped by [facility] for CXR - no orders. Note in chart to do CXR last week during office visit [facility]. Hilda Blades Malva Diesing RN 09/22/13 3PM

## 2013-09-22 NOTE — Addendum Note (Signed)
Addended by: Otho Bellows on: 09/22/2013 02:22 PM   Modules accepted: Orders

## 2013-09-24 NOTE — Addendum Note (Signed)
Addended by: Otho Bellows on: 09/24/2013 07:10 AM   Modules accepted: Orders

## 2013-09-24 NOTE — Telephone Encounter (Signed)
The order is in. Thanks

## 2013-09-24 NOTE — Telephone Encounter (Signed)
Pt aware.

## 2013-09-25 ENCOUNTER — Ambulatory Visit (HOSPITAL_COMMUNITY)
Admission: RE | Admit: 2013-09-25 | Discharge: 2013-09-25 | Disposition: A | Discharge status: Home / Self Care | Payer: PRIVATE HEALTH INSURANCE | Source: Ambulatory Visit | Attending: Internal Medicine | Admitting: Internal Medicine

## 2013-09-25 DIAGNOSIS — J189 Pneumonia, unspecified organism: Secondary | ICD-10-CM | POA: Insufficient documentation

## 2013-10-02 ENCOUNTER — Other Ambulatory Visit: Payer: Self-pay | Admitting: *Deleted

## 2013-10-09 ENCOUNTER — Other Ambulatory Visit: Payer: Self-pay | Admitting: *Deleted

## 2013-10-13 MED ORDER — VITAMIN B-1 100 MG PO TABS: 100.0000 mg | ORAL_TABLET | Freq: Every day | ORAL | Status: DC

## 2013-10-21 ENCOUNTER — Other Ambulatory Visit (HOSPITAL_COMMUNITY): Payer: Self-pay | Admitting: Nephrology

## 2013-10-21 DIAGNOSIS — N183 Chronic kidney disease, stage 3 unspecified: Secondary | ICD-10-CM

## 2013-10-21 DIAGNOSIS — R809 Proteinuria, unspecified: Secondary | ICD-10-CM

## 2013-10-21 DIAGNOSIS — IMO0002 Reserved for concepts with insufficient information to code with codable children: Secondary | ICD-10-CM

## 2013-10-21 DIAGNOSIS — M329 Systemic lupus erythematosus, unspecified: Secondary | ICD-10-CM

## 2013-10-22 ENCOUNTER — Other Ambulatory Visit: Payer: Self-pay | Admitting: Radiology

## 2013-10-22 ENCOUNTER — Encounter (HOSPITAL_COMMUNITY): Payer: Self-pay | Admitting: Pharmacy Technician

## 2013-10-24 ENCOUNTER — Other Ambulatory Visit: Payer: Self-pay | Admitting: Radiology

## 2013-10-28 ENCOUNTER — Other Ambulatory Visit: Payer: Self-pay | Admitting: Radiology

## 2013-10-29 ENCOUNTER — Ambulatory Visit (HOSPITAL_COMMUNITY)
Admission: RE | Admit: 2013-10-29 | Discharge: 2013-10-29 | Disposition: A | Discharge status: Home / Self Care | Payer: Medicare Other | Source: Ambulatory Visit | Attending: Nephrology | Admitting: Nephrology

## 2013-10-29 DIAGNOSIS — R809 Proteinuria, unspecified: Secondary | ICD-10-CM | POA: Diagnosis not present

## 2013-10-29 DIAGNOSIS — N183 Chronic kidney disease, stage 3 unspecified: Secondary | ICD-10-CM

## 2013-10-29 DIAGNOSIS — N179 Acute kidney failure, unspecified: Secondary | ICD-10-CM | POA: Diagnosis present

## 2013-10-29 DIAGNOSIS — IMO0002 Reserved for concepts with insufficient information to code with codable children: Secondary | ICD-10-CM

## 2013-10-29 DIAGNOSIS — M329 Systemic lupus erythematosus, unspecified: Secondary | ICD-10-CM

## 2013-10-29 LAB — CBC
HEMATOCRIT: 28 % — AB (ref 36.0–46.0)
HEMOGLOBIN: 9.3 g/dL — AB (ref 12.0–15.0)
MCH: 32.2 pg (ref 26.0–34.0)
MCHC: 33.2 g/dL (ref 30.0–36.0)
MCV: 96.9 fL (ref 78.0–100.0)
Platelets: 247 10*3/uL (ref 150–400)
RBC: 2.89 MIL/uL — ABNORMAL LOW (ref 3.87–5.11)
RDW: 15 % (ref 11.5–15.5)
WBC: 6.1 10*3/uL (ref 4.0–10.5)

## 2013-10-29 LAB — PROTIME-INR
INR: 1.01 (ref 0.00–1.49)
Prothrombin Time: 13.1 seconds (ref 11.6–15.2)

## 2013-10-29 LAB — APTT: APTT: 30 s (ref 24–37)

## 2013-10-29 MED ORDER — HYDROCODONE-ACETAMINOPHEN 5-325 MG PO TABS: 1.0000 | ORAL_TABLET | ORAL | Status: DC | PRN

## 2013-10-29 MED ORDER — MIDAZOLAM HCL 2 MG/2ML IJ SOLN
INTRAMUSCULAR | Status: AC
  Filled 2013-10-29: qty 4

## 2013-10-29 MED ORDER — MIDAZOLAM HCL 2 MG/2ML IJ SOLN
INTRAMUSCULAR | Status: AC | PRN
  Administered 2013-10-29 (×2): 1 mg via INTRAVENOUS

## 2013-10-29 MED ORDER — FENTANYL CITRATE 0.05 MG/ML IJ SOLN
INTRAMUSCULAR | Status: AC | PRN
  Administered 2013-10-29: 25 ug via INTRAVENOUS
  Administered 2013-10-29: 50 ug via INTRAVENOUS

## 2013-10-29 MED ORDER — SODIUM CHLORIDE 0.9 % IV SOLN: INTRAVENOUS | Status: DC

## 2013-10-29 MED ORDER — FENTANYL CITRATE 0.05 MG/ML IJ SOLN
INTRAMUSCULAR | Status: AC
  Filled 2013-10-29: qty 4

## 2013-10-29 NOTE — Sedation Documentation (Signed)
Taking po's, VSS.  Report called by Nelly Laurence, RN.

## 2013-10-29 NOTE — H&P (Signed)
[name] [name] is an 59 y.o. female.   Chief Complaint: "I'm having a kidney biopsy" HPI: Patient with history of lupus, proteinuria, anemia,HTN and CKD stage 3 presents today for US guided random renal biopsy.  Past Medical History  Diagnosis Date  . Anemia, B12 deficiency   . History of acute pancreatitis   . Right knee pain     No recent imaging on chart  . 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 Dr. Mancel Bale, [facility]  . Hypertriglyceridemia   . GERD (gastroesophageal reflux disease)   . Vitamin D deficiency   . Subdural hematoma 02/2008    Likely 2/2 trauma from seizure from EtOH withdrawal, chronic in nature, sees Dr. Jerene Bears. Most recent CT head 10/2009 showing stable but persistent hematoma without mass effect.  . History of seizure disorder     Likely 2/2 alcohol abuse  . Hypocalcemia   . Hypomagnesemia   . Failure to thrive in childhood     Unclear etiology  . HTN (hypertension)   . Thrombocytopenia   . Anemia, macrocytic   . Hepatomegaly     On exam  . Joint pain   . Alcohol abuse   . Vitamin D deficiency   . Menopause   . Pancreatitis   . Insomnia   . Hyperlipidemia   . Sinusitis   . Pernicious anemia   . Macrocytic anemia   . 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 Depression   . Fx humeral neck 04/17/2011    Transverse fracture- minimally displaced- managed as outpatient   . ABNORMAL PAP SMEAR, LGSIL 07/23/2008    Annotation: HPV positive CIN I Dr. Mancel Bale, [facility] Qualifier: Diagnosis of  By: Oretha Ellis    . Pneumonia 05/20/2012  . Seizures     "last one was in 2013; don't know what kind" (08/15/2013)  . Arthritis     "shoulders" (08/15/2013)    Past Surgical History  Procedure Laterality Date  . Cesarean section  1983  . Esophagogastroduodenoscopy  07/11/2011    Procedure: ESOPHAGOGASTRODUODENOSCOPY (EGD);  Surgeon: Beryle Beams, MD;  Location: [facility]  ENDOS[facility];  Service: Endoscopy;  Laterality: N/A;  . Colonoscopy  07/11/2011    Procedure: COLONOSCOPY;  Surgeon: Beryle Beams, MD;  Location: [facility] ENDOSCOPY;  Service: Endoscopy;  Laterality: N/A;  . Eye surgery Left     "trauma"  . Right colectomy  08/28/2011  . Esophagogastroduodenoscopy N/A 12/01/2012    Procedure: ESOPHAGOGASTRODUODENOSCOPY (EGD);  Surgeon: Irene Shipper, MD;  Location: [facility] ENDOSCOPY;  Service: Endoscopy;  Laterality: N/A;  . Colonoscopy with esophagogastroduodenoscopy (egd) Left 08/21/2013    Procedure: COLONOSCOPY WITH ESOPHAGOGASTRODUODENOSCOPY (EGD);  Surgeon: Beryle Beams, MD;  Location: [facility] ENDOSCOPY;  Service: Endoscopy;  Laterality: Left;    Family History  Problem Relation Age of Onset  . Cancer Mother     Died from stomach cancer and "flesh eating rash  . Heart failure Father     Died in 9s from an MI  . Alcohol abuse Sister     Twin sister drinks a lot, as did both her parents and brothers  . Stroke Brother     Has 7 brothers, 1 with CVA   Social History:  reports that she quit smoking about 3 years ago. Her smoking use included Cigarettes. She has a 20 pack-year smoking history. She has never used smokeless tobacco. She reports that  she drinks about 12.6 ounces of alcohol per week. She reports that she uses illicit drugs (Marijuana and Cocaine).  Allergies:  Allergies  Allergen Reactions  . Amitriptyline Hcl Swelling    In the face.  . Doxycycline Hyclate Itching    Feels like something crawling under her skin    Current outpatient prescriptions:aspirin EC 81 MG tablet, Take 1 tablet (81 mg total) by mouth every evening., Disp: 90 tablet, Rfl: 3;  calcium carbonate (TUMS) 500 MG chewable tablet, Chew 6 tablets (1,200 mg of elemental calcium total) by mouth 3 (three) times daily. For bone health, Disp: 90 tablet, Rfl: 3;  diclofenac sodium (VOLTAREN) 1 % GEL, Apply 2 g topically 2 (two) times daily. To affected area, Disp: 100 g, Rfl: 0 feeding  supplement, ENSURE COMPLETE, (ENSURE COMPLETE) LIQD, Take 237 mLs by mouth 3 (three) times daily between meals., Disp: 90 Bottle, Rfl: 1;  ferrous sulfate 325 (65 FE) MG tablet, Take 1 tablet (325 mg total) by mouth 2 (two) times daily with a meal., Disp: 180 tablet, Rfl: 3;  FLUoxetine (PROZAC) 10 MG capsule, Take 1 capsule (10 mg total) by mouth daily. For depression, Disp: 90 capsule, Rfl: 4 folic acid (FOLVITE) 1 MG tablet, Take 1 tablet (1 mg total) by mouth daily. For folic acid replacement, Disp: 30 tablet, Rfl: 3;  gabapentin (NEURONTIN) 300 MG capsule, Take 2 capsules (600 mg total) by mouth 3 (three) times daily. For anxiety/pain control, Disp: 540 capsule, Rfl: 1;  lipase/protease/amylase (CREON-12/PANCREASE) 12000 UNITS CPEP capsule, Take 1 capsule by mouth 3 (three) times daily before meals., Disp: 270 capsule, Rfl: 3 magnesium oxide (MAG-OX) 400 MG tablet, Take 1 tablet (400 mg total) by mouth 4 (four) times daily., Disp: 360 tablet, Rfl: 6;  Multiple Vitamin (MULTIVITAMIN WITH MINERALS) TABS tablet, Take 1 tablet by mouth daily. For vitamin replacement, Disp: 90 tablet, Rfl: 3;  omeprazole (PRILOSEC) 40 MG capsule, Take 1 capsule (40 mg total) by mouth daily., Disp: 90 capsule, Rfl: 4 thiamine (VITAMIN B-1) 100 MG tablet, Take 1 tablet (100 mg total) by mouth daily. For low thiamine, Disp: 90 tablet, Rfl: 3;  vitamin B-12 (CYANOCOBALAMIN) 250 MCG tablet, Take 1 tablet (250 mcg total) by mouth every evening., Disp: 90 tablet, Rfl: 3;  Vitamin D, Ergocalciferol, (DRISDOL) 50000 UNITS CAPS capsule, Take 1 capsule (50,000 Units total) by mouth every 7 (seven) days. Thursdays: For bone health, Disp: 4 capsule, Rfl: 3 cetirizine (ZYRTEC) 10 MG tablet, Take 1 tablet (10 mg total) by mouth daily., Disp: 90 tablet, Rfl: 3;  meclizine (ANTIVERT) 50 MG tablet, Take 1 tablet (50 mg total) by mouth 3 (three) times daily as needed for dizziness., Disp: 30 tablet, Rfl: 0;  potassium chloride SA (KLOR-CON M20) 20  MEQ tablet, Take 1 tablet (20 mEq total) by mouth daily. For low potassium, Disp: 90 tablet, Rfl: 0 Current facility-administered medications:0.9 %  sodium chloride infusion, , Intravenous, Continuous, Hedy Jacob, PA-C 10/29/13 labs pending  Review of Systems  Constitutional: Negative for fever and chills.  Respiratory: Negative for cough and shortness of breath.   Cardiovascular: Negative for chest pain.  Gastrointestinal: Negative for nausea, vomiting and abdominal pain.  Genitourinary: Negative for hematuria.  Neurological: Negative for headaches.  Endo/Heme/Allergies: Does not bruise/bleed easily.    Blood pressure 144/96, pulse 79, temperature 97.9 F (36.6 C), resp. rate 20, height 5\' 1"  (1.549 m), weight 91 lb (41.277 kg), SpO2 100.00%. Physical Exam  Constitutional: She is oriented to person, place, and time.  Thin BF in NAD  Cardiovascular: Normal rate and regular rhythm.   Respiratory: Effort normal.  Few rt basilar crackles, left clear  GI: Soft. Bowel sounds are normal. There is no tenderness.  Musculoskeletal: Normal range of motion. She exhibits no edema.  Neurological: She is alert and oriented to person, place, and time.     Assessment/Plan Patient with history of lupus, proteinuria, anemia,HTN and CKD stage 3 presents today for US guided random renal biopsy. Details/risks of procedure d/w pt with her understanding and consent.    [name] [name] 10/29/2013, 9:21 AM

## 2013-10-29 NOTE — Procedures (Signed)
16 g renal Bx, L lower pole No comp

## 2013-10-29 NOTE — Discharge Instructions (Signed)
Kidney Biopsy, Care After °Refer to this sheet in the next few weeks. These instructions provide you with information on caring for yourself after your procedure. Your health care provider may also give you more specific instructions. Your treatment has been planned according to current medical practices, but problems sometimes occur. Call your health care provider if you have any problems or questions after your procedure.  °WHAT TO EXPECT AFTER THE PROCEDURE  °· You may notice blood in the urine for the first 24 hours after the biopsy. °· You may feel some pain at the biopsy site for 1 2 weeks after the biopsy. °HOME CARE INSTRUCTIONS °· Do not lift anything heavier than 10 lb (4.5 kg) for 2 weeks. °· Do not take any non-steroidal anti-inflammatory drugs (NSAIDs) or any blood thinners for a week after the biopsy unless instructed to do so by your health care provider. °· Only take medicines for pain, fever, or discomfort as directed by your health care provider. °SEEK MEDICAL CARE IF: °· You have bloody urine more than 24 hours after the biopsy.   °· You develop a fever.   °· You cannot urinate.   °· You have increasing pain at the biopsy site.   °SEEK IMMEDIATE MEDICAL CARE IF: °You feel faint or dizzy.  °Document Released: 01/22/2013 Document Reviewed: 01/22/2013 °[employer]® Patient Information ©2014 [employer]. ° °

## 2013-11-06 ENCOUNTER — Other Ambulatory Visit: Payer: Self-pay | Admitting: *Deleted

## 2013-11-06 ENCOUNTER — Encounter (HOSPITAL_COMMUNITY): Payer: Self-pay

## 2013-11-06 MED ORDER — FOLIC ACID 1 MG PO TABS: 1.0000 mg | ORAL_TABLET | Freq: Every day | ORAL | Status: DC

## 2013-11-22 ENCOUNTER — Encounter (HOSPITAL_COMMUNITY): Payer: Self-pay | Admitting: Emergency Medicine

## 2013-11-22 ENCOUNTER — Inpatient Hospital Stay (HOSPITAL_COMMUNITY)
Admission: EM | Admit: 2013-11-22 | Discharge: 2013-11-26 | DRG: 682 | Disposition: A | Discharge status: Home / Self Care | Payer: PRIVATE HEALTH INSURANCE | Attending: Internal Medicine | Admitting: Internal Medicine

## 2013-11-22 ENCOUNTER — Emergency Department (HOSPITAL_COMMUNITY): Payer: PRIVATE HEALTH INSURANCE

## 2013-11-22 DIAGNOSIS — D72829 Elevated white blood cell count, unspecified: Secondary | ICD-10-CM | POA: Diagnosis present

## 2013-11-22 DIAGNOSIS — D696 Thrombocytopenia, unspecified: Secondary | ICD-10-CM | POA: Diagnosis present

## 2013-11-22 DIAGNOSIS — J189 Pneumonia, unspecified organism: Secondary | ICD-10-CM

## 2013-11-22 DIAGNOSIS — G894 Chronic pain syndrome: Secondary | ICD-10-CM | POA: Diagnosis present

## 2013-11-22 DIAGNOSIS — G589 Mononeuropathy, unspecified: Secondary | ICD-10-CM | POA: Diagnosis present

## 2013-11-22 DIAGNOSIS — N051 Unspecified nephritic syndrome with focal and segmental glomerular lesions: Secondary | ICD-10-CM

## 2013-11-22 DIAGNOSIS — D631 Anemia in chronic kidney disease: Secondary | ICD-10-CM | POA: Diagnosis present

## 2013-11-22 DIAGNOSIS — F329 Major depressive disorder, single episode, unspecified: Secondary | ICD-10-CM | POA: Diagnosis present

## 2013-11-22 DIAGNOSIS — I5033 Acute on chronic diastolic (congestive) heart failure: Secondary | ICD-10-CM

## 2013-11-22 DIAGNOSIS — R7309 Other abnormal glucose: Secondary | ICD-10-CM | POA: Diagnosis present

## 2013-11-22 DIAGNOSIS — E2740 Unspecified adrenocortical insufficiency: Secondary | ICD-10-CM

## 2013-11-22 DIAGNOSIS — J209 Acute bronchitis, unspecified: Secondary | ICD-10-CM

## 2013-11-22 DIAGNOSIS — E871 Hypo-osmolality and hyponatremia: Secondary | ICD-10-CM | POA: Diagnosis not present

## 2013-11-22 DIAGNOSIS — J40 Bronchitis, not specified as acute or chronic: Secondary | ICD-10-CM

## 2013-11-22 DIAGNOSIS — R627 Adult failure to thrive: Secondary | ICD-10-CM | POA: Diagnosis present

## 2013-11-22 DIAGNOSIS — E559 Vitamin D deficiency, unspecified: Secondary | ICD-10-CM

## 2013-11-22 DIAGNOSIS — J9600 Acute respiratory failure, unspecified whether with hypoxia or hypercapnia: Secondary | ICD-10-CM | POA: Diagnosis present

## 2013-11-22 DIAGNOSIS — F1020 Alcohol dependence, uncomplicated: Secondary | ICD-10-CM | POA: Diagnosis present

## 2013-11-22 DIAGNOSIS — I5030 Unspecified diastolic (congestive) heart failure: Secondary | ICD-10-CM | POA: Diagnosis present

## 2013-11-22 DIAGNOSIS — E876 Hypokalemia: Secondary | ICD-10-CM | POA: Diagnosis present

## 2013-11-22 DIAGNOSIS — E874 Mixed disorder of acid-base balance: Secondary | ICD-10-CM

## 2013-11-22 DIAGNOSIS — I509 Heart failure, unspecified: Secondary | ICD-10-CM | POA: Diagnosis present

## 2013-11-22 DIAGNOSIS — I5043 Acute on chronic combined systolic (congestive) and diastolic (congestive) heart failure: Secondary | ICD-10-CM

## 2013-11-22 DIAGNOSIS — Z7982 Long term (current) use of aspirin: Secondary | ICD-10-CM

## 2013-11-22 DIAGNOSIS — N183 Chronic kidney disease, stage 3 unspecified: Secondary | ICD-10-CM

## 2013-11-22 DIAGNOSIS — Z87891 Personal history of nicotine dependence: Secondary | ICD-10-CM

## 2013-11-22 DIAGNOSIS — J810 Acute pulmonary edema: Secondary | ICD-10-CM

## 2013-11-22 DIAGNOSIS — E43 Unspecified severe protein-calorie malnutrition: Secondary | ICD-10-CM | POA: Diagnosis present

## 2013-11-22 DIAGNOSIS — E2749 Other adrenocortical insufficiency: Secondary | ICD-10-CM | POA: Diagnosis present

## 2013-11-22 DIAGNOSIS — N179 Acute kidney failure, unspecified: Principal | ICD-10-CM

## 2013-11-22 DIAGNOSIS — I129 Hypertensive chronic kidney disease with stage 1 through stage 4 chronic kidney disease, or unspecified chronic kidney disease: Secondary | ICD-10-CM | POA: Diagnosis present

## 2013-11-22 DIAGNOSIS — M19019 Primary osteoarthritis, unspecified shoulder: Secondary | ICD-10-CM | POA: Diagnosis present

## 2013-11-22 DIAGNOSIS — N269 Renal sclerosis, unspecified: Secondary | ICD-10-CM | POA: Diagnosis present

## 2013-11-22 DIAGNOSIS — K219 Gastro-esophageal reflux disease without esophagitis: Secondary | ICD-10-CM | POA: Diagnosis present

## 2013-11-22 DIAGNOSIS — F1010 Alcohol abuse, uncomplicated: Secondary | ICD-10-CM

## 2013-11-22 DIAGNOSIS — F3289 Other specified depressive episodes: Secondary | ICD-10-CM | POA: Diagnosis present

## 2013-11-22 DIAGNOSIS — E8729 Other acidosis: Secondary | ICD-10-CM | POA: Diagnosis present

## 2013-11-22 DIAGNOSIS — N184 Chronic kidney disease, stage 4 (severe): Secondary | ICD-10-CM | POA: Diagnosis present

## 2013-11-22 DIAGNOSIS — E785 Hyperlipidemia, unspecified: Secondary | ICD-10-CM | POA: Diagnosis present

## 2013-11-22 DIAGNOSIS — E872 Acidosis, unspecified: Secondary | ICD-10-CM | POA: Diagnosis present

## 2013-11-22 DIAGNOSIS — Z8611 Personal history of tuberculosis: Secondary | ICD-10-CM

## 2013-11-22 DIAGNOSIS — Z79899 Other long term (current) drug therapy: Secondary | ICD-10-CM

## 2013-11-22 DIAGNOSIS — R1110 Vomiting, unspecified: Secondary | ICD-10-CM

## 2013-11-22 DIAGNOSIS — J9601 Acute respiratory failure with hypoxia: Secondary | ICD-10-CM

## 2013-11-22 DIAGNOSIS — K7030 Alcoholic cirrhosis of liver without ascites: Secondary | ICD-10-CM | POA: Diagnosis present

## 2013-11-22 DIAGNOSIS — F32A Depression, unspecified: Secondary | ICD-10-CM | POA: Diagnosis present

## 2013-11-22 DIAGNOSIS — E875 Hyperkalemia: Secondary | ICD-10-CM

## 2013-11-22 DIAGNOSIS — N039 Chronic nephritic syndrome with unspecified morphologic changes: Secondary | ICD-10-CM

## 2013-11-22 DIAGNOSIS — N189 Chronic kidney disease, unspecified: Secondary | ICD-10-CM | POA: Diagnosis present

## 2013-11-22 LAB — POCT I-STAT 3, ART BLOOD GAS (G3+)
Acid-base deficit: 14 mmol/L — ABNORMAL HIGH (ref 0.0–2.0)
Bicarbonate: 12.7 mEq/L — ABNORMAL LOW (ref 20.0–24.0)
O2 Saturation: 94 %
PCO2 ART: 30.8 mmHg — AB (ref 35.0–45.0)
PO2 ART: 83 mmHg (ref 80.0–100.0)
Patient temperature: 98.6
TCO2: 14 mmol/L (ref 0–100)
pH, Arterial: 7.224 — ABNORMAL LOW (ref 7.350–7.450)

## 2013-11-22 LAB — URINALYSIS, ROUTINE W REFLEX MICROSCOPIC
Bilirubin Urine: NEGATIVE
Glucose, UA: NEGATIVE mg/dL
HGB URINE DIPSTICK: NEGATIVE
KETONES UR: NEGATIVE mg/dL
Nitrite: NEGATIVE
PROTEIN: 100 mg/dL — AB
Specific Gravity, Urine: 1.015 (ref 1.005–1.030)
Urobilinogen, UA: 0.2 mg/dL (ref 0.0–1.0)
pH: 5.5 (ref 5.0–8.0)

## 2013-11-22 LAB — BASIC METABOLIC PANEL
BUN: 29 mg/dL — AB (ref 6–23)
CHLORIDE: 102 meq/L (ref 96–112)
CO2: 13 mEq/L — ABNORMAL LOW (ref 19–32)
Calcium: 8.2 mg/dL — ABNORMAL LOW (ref 8.4–10.5)
Creatinine, Ser: 2.47 mg/dL — ABNORMAL HIGH (ref 0.50–1.10)
GFR calc non Af Amer: 21 mL/min — ABNORMAL LOW (ref >90)
GFR, EST AFRICAN AMERICAN: 24 mL/min — AB (ref >90)
Glucose, Bld: 106 mg/dL — ABNORMAL HIGH (ref 70–99)
POTASSIUM: 3.4 meq/L — AB (ref 3.7–5.3)
SODIUM: 135 meq/L — AB (ref 137–147)

## 2013-11-22 LAB — HEPATIC FUNCTION PANEL
ALT: 13 U/L (ref 0–35)
AST: 37 U/L (ref 0–37)
Albumin: 2.8 g/dL — ABNORMAL LOW (ref 3.5–5.2)
Alkaline Phosphatase: 287 U/L — ABNORMAL HIGH (ref 39–117)
BILIRUBIN TOTAL: 0.3 mg/dL (ref 0.3–1.2)
Total Protein: 9.1 g/dL — ABNORMAL HIGH (ref 6.0–8.3)

## 2013-11-22 LAB — MAGNESIUM: MAGNESIUM: 1.1 mg/dL — AB (ref 1.5–2.5)

## 2013-11-22 LAB — CBC WITH DIFFERENTIAL/PLATELET
BASOS ABS: 0 10*3/uL (ref 0.0–0.1)
Basophils Relative: 0 % (ref 0–1)
Eosinophils Absolute: 0.1 10*3/uL (ref 0.0–0.7)
Eosinophils Relative: 1 % (ref 0–5)
HEMATOCRIT: 28.7 % — AB (ref 36.0–46.0)
HEMOGLOBIN: 9.8 g/dL — AB (ref 12.0–15.0)
LYMPHS PCT: 34 % (ref 12–46)
Lymphs Abs: 3.6 10*3/uL (ref 0.7–4.0)
MCH: 32.1 pg (ref 26.0–34.0)
MCHC: 34.1 g/dL (ref 30.0–36.0)
MCV: 94.1 fL (ref 78.0–100.0)
MONOS PCT: 18 % — AB (ref 3–12)
Monocytes Absolute: 1.9 10*3/uL — ABNORMAL HIGH (ref 0.1–1.0)
NEUTROS ABS: 5.1 10*3/uL (ref 1.7–7.7)
Neutrophils Relative %: 47 % (ref 43–77)
Platelets: 225 10*3/uL (ref 150–400)
RBC: 3.05 MIL/uL — ABNORMAL LOW (ref 3.87–5.11)
RDW: 13.8 % (ref 11.5–15.5)
WBC: 10.7 10*3/uL — ABNORMAL HIGH (ref 4.0–10.5)

## 2013-11-22 LAB — RAPID URINE DRUG SCREEN, HOSP PERFORMED
Amphetamines: NOT DETECTED
Barbiturates: NOT DETECTED
Benzodiazepines: NOT DETECTED
Cocaine: NOT DETECTED
Opiates: POSITIVE — AB
TETRAHYDROCANNABINOL: NOT DETECTED

## 2013-11-22 LAB — URINE MICROSCOPIC-ADD ON

## 2013-11-22 LAB — PHOSPHORUS: Phosphorus: 4.5 mg/dL (ref 2.3–4.6)

## 2013-11-22 MED ORDER — PANTOPRAZOLE SODIUM 40 MG PO TBEC
40.0000 mg | DELAYED_RELEASE_TABLET | Freq: Every day | ORAL | Status: DC
  Administered 2013-11-22 – 2013-11-26 (×5): 40 mg via ORAL
  Filled 2013-11-22 (×5): qty 1

## 2013-11-22 MED ORDER — ENSURE COMPLETE PO LIQD
237.0000 mL | Freq: Three times a day (TID) | ORAL | Status: DC
  Administered 2013-11-22 – 2013-11-26 (×11): 237 mL via ORAL

## 2013-11-22 MED ORDER — FERROUS SULFATE 325 (65 FE) MG PO TABS
325.0000 mg | ORAL_TABLET | Freq: Two times a day (BID) | ORAL | Status: DC
  Administered 2013-11-22 – 2013-11-26 (×8): 325 mg via ORAL
  Filled 2013-11-22 (×10): qty 1

## 2013-11-22 MED ORDER — GUAIFENESIN-CODEINE 100-10 MG/5ML PO SOLN
5.0000 mL | Freq: Once | ORAL | Status: AC
  Administered 2013-11-22: 5 mL via ORAL
  Filled 2013-11-22: qty 5

## 2013-11-22 MED ORDER — BENZONATATE 100 MG PO CAPS
200.0000 mg | ORAL_CAPSULE | Freq: Once | ORAL | Status: AC
  Administered 2013-11-22: 200 mg via ORAL
  Filled 2013-11-22: qty 2

## 2013-11-22 MED ORDER — PROMETHAZINE HCL 25 MG PO TABS: 12.5000 mg | ORAL_TABLET | Freq: Four times a day (QID) | ORAL | Status: DC | PRN

## 2013-11-22 MED ORDER — GUAIFENESIN-CODEINE 100-10 MG/5ML PO SOLN
5.0000 mL | ORAL | Status: DC | PRN
  Administered 2013-11-22 – 2013-11-23 (×5): 5 mL via ORAL
  Filled 2013-11-22 (×5): qty 5

## 2013-11-22 MED ORDER — IPRATROPIUM BROMIDE 0.02 % IN SOLN
0.5000 mg | Freq: Once | RESPIRATORY_TRACT | Status: AC
  Administered 2013-11-22: 0.5 mg via RESPIRATORY_TRACT
  Filled 2013-11-22: qty 2.5

## 2013-11-22 MED ORDER — FLUOXETINE HCL 10 MG PO CAPS
10.0000 mg | ORAL_CAPSULE | Freq: Every day | ORAL | Status: DC
  Administered 2013-11-22 – 2013-11-26 (×5): 10 mg via ORAL
  Filled 2013-11-22 (×5): qty 1

## 2013-11-22 MED ORDER — AMLODIPINE BESYLATE 10 MG PO TABS
10.0000 mg | ORAL_TABLET | Freq: Every day | ORAL | Status: DC
  Administered 2013-11-22 – 2013-11-23 (×2): 10 mg via ORAL
  Filled 2013-11-22 (×3): qty 1

## 2013-11-22 MED ORDER — MAGNESIUM OXIDE 400 MG PO TABS: 400.0000 mg | ORAL_TABLET | Freq: Four times a day (QID) | ORAL | Status: DC

## 2013-11-22 MED ORDER — ALBUTEROL SULFATE (2.5 MG/3ML) 0.083% IN NEBU
5.0000 mg | INHALATION_SOLUTION | Freq: Once | RESPIRATORY_TRACT | Status: AC
  Administered 2013-11-22: 5 mg via RESPIRATORY_TRACT
  Filled 2013-11-22: qty 6

## 2013-11-22 MED ORDER — PANCRELIPASE (LIP-PROT-AMYL) 12000-38000 UNITS PO CPEP
1.0000 | ORAL_CAPSULE | Freq: Three times a day (TID) | ORAL | Status: DC
  Administered 2013-11-22 – 2013-11-26 (×11): 1 via ORAL
  Filled 2013-11-22 (×14): qty 1

## 2013-11-22 MED ORDER — GABAPENTIN 300 MG PO CAPS
600.0000 mg | ORAL_CAPSULE | Freq: Three times a day (TID) | ORAL | Status: DC
  Administered 2013-11-22 – 2013-11-26 (×11): 600 mg via ORAL
  Filled 2013-11-22 (×14): qty 2

## 2013-11-22 MED ORDER — ASPIRIN EC 81 MG PO TBEC
81.0000 mg | DELAYED_RELEASE_TABLET | Freq: Every evening | ORAL | Status: DC
  Administered 2013-11-22 – 2013-11-25 (×4): 81 mg via ORAL
  Filled 2013-11-22 (×5): qty 1

## 2013-11-22 MED ORDER — SODIUM CHLORIDE 0.9 % IV BOLUS (SEPSIS)
500.0000 mL | Freq: Once | INTRAVENOUS | Status: AC
  Administered 2013-11-22: 500 mL via INTRAVENOUS

## 2013-11-22 MED ORDER — SODIUM CHLORIDE 0.9 % IV SOLN: INTRAVENOUS | Status: DC

## 2013-11-22 MED ORDER — MAGNESIUM OXIDE 400 MG PO TABS
400.0000 mg | ORAL_TABLET | Freq: Four times a day (QID) | ORAL | Status: DC
  Administered 2013-11-22 – 2013-11-26 (×15): 400 mg via ORAL
  Filled 2013-11-22 (×18): qty 1

## 2013-11-22 MED ORDER — POTASSIUM CHLORIDE CRYS ER 20 MEQ PO TBCR
20.0000 meq | EXTENDED_RELEASE_TABLET | Freq: Every day | ORAL | Status: DC
  Administered 2013-11-22 – 2013-11-24 (×3): 20 meq via ORAL
  Filled 2013-11-22 (×3): qty 1

## 2013-11-22 MED ORDER — FOLIC ACID 1 MG PO TABS
1.0000 mg | ORAL_TABLET | Freq: Every day | ORAL | Status: DC
  Administered 2013-11-22 – 2013-11-26 (×5): 1 mg via ORAL
  Filled 2013-11-22 (×5): qty 1

## 2013-11-22 MED ORDER — LACTATED RINGERS IV SOLN
INTRAVENOUS | Status: AC
  Administered 2013-11-22 – 2013-11-23 (×3): via INTRAVENOUS

## 2013-11-22 MED ORDER — VITAMIN B-1 100 MG PO TABS
100.0000 mg | ORAL_TABLET | Freq: Every day | ORAL | Status: DC
  Administered 2013-11-22 – 2013-11-26 (×5): 100 mg via ORAL
  Filled 2013-11-22 (×5): qty 1

## 2013-11-22 MED ORDER — MAGNESIUM SULFATE 40 MG/ML IJ SOLN
2.0000 g | Freq: Once | INTRAMUSCULAR | Status: AC
  Administered 2013-11-22: 2 g via INTRAVENOUS
  Filled 2013-11-22: qty 50

## 2013-11-22 MED ORDER — CYANOCOBALAMIN 250 MCG PO TABS
250.0000 ug | ORAL_TABLET | Freq: Every evening | ORAL | Status: DC
  Administered 2013-11-22 – 2013-11-25 (×4): 250 ug via ORAL
  Filled 2013-11-22 (×5): qty 1

## 2013-11-22 NOTE — Plan of Care (Signed)
Problem: Phase I Progression Outcomes Goal: OOB as tolerated unless otherwise ordered Outcome: Completed/Met Date Met:  11/22/13 Pt ambulated to bathroom with steady gait. Denied dizziness.

## 2013-11-22 NOTE — Plan of Care (Signed)
Problem: Phase I Progression Outcomes Goal: Initial discharge plan identified Outcome: Completed/Met Date Met:  11/22/13 Pt to return home to private residence with significant other and 2 minor grandchildren.

## 2013-11-22 NOTE — ED Notes (Addendum)
Pt states that she has had a cough for 5 days. States that she is coughing up white sputum. Pt states that she has had chills as well. NAD noted. Pt denies chest pain/SOB. States that her throat is sore. Pt states that she had vomited after she eats as well. States "i can only keep down cold water".

## 2013-11-22 NOTE — ED Notes (Signed)
Admitting MD at bedside.

## 2013-11-22 NOTE — ED Provider Notes (Signed)
CSN: [id number]     Arrival date & time 11/22/13  0941 History   First MD Initiated Contact with Patient 11/22/13 [id number]     Chief Complaint  Patient presents with  . Cough     (Consider location/radiation/quality/duration/timing/severity/associated sxs/prior Treatment) HPI  PCP: Orbie Hurst, MD  [name] [name] is a 58 y.o.female with a significant PMH of seizure disorder (last seizure over 2 years ago), anemia, hx of pancreatitis, hepatomegaly, alcohol abuse, joint pains, hx of pneumonia presents to the ER with complaints of coughing for over 1 week with post tussive vomiting up thick white sputum, hot flashes, chills and sore throat. She denies feeling weak or having fevers. She has been able to keep down her home medications. She has not had any chest pains, SOB, ear pain, orthopnea, headache, neck pain, abdominal pains, diarrhea, constipation or any other associated symptoms.   Past Medical History  Diagnosis Date  . Anemia, B12 deficiency   . History of acute pancreatitis   . Right knee pain     No recent imaging on chart  . 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 Dr. Mancel Bale, [facility]  . Hypertriglyceridemia   . GERD (gastroesophageal reflux disease)   . Vitamin D deficiency   . Subdural hematoma 02/2008    Likely 2/2 trauma from seizure from EtOH withdrawal, chronic in nature, sees Dr. Jerene Bears. Most recent CT head 10/2009 showing stable but persistent hematoma without mass effect.  . History of seizure disorder     Likely 2/2 alcohol abuse  . Hypocalcemia   . Hypomagnesemia   . Failure to thrive in childhood     Unclear etiology  . HTN (hypertension)   . Thrombocytopenia   . Anemia, macrocytic   . Hepatomegaly     On exam  . Joint pain   . Alcohol abuse   . Vitamin D deficiency   . Menopause   . Pancreatitis   . Insomnia   . Hyperlipidemia   . Sinusitis   . Pernicious anemia   . Macrocytic  anemia   . 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 Depression   . Fx humeral neck 04/17/2011    Transverse fracture- minimally displaced- managed as outpatient   . ABNORMAL PAP SMEAR, LGSIL 07/23/2008    Annotation: HPV positive CIN I Dr. Mancel Bale, [facility] Qualifier: Diagnosis of  By: Oretha Ellis    . Pneumonia 05/20/2012  . Seizures     "last one was in 2013; don't know what kind" (08/15/2013)  . Arthritis     "shoulders" (08/15/2013)   Past Surgical History  Procedure Laterality Date  . Cesarean section  1983  . Esophagogastroduodenoscopy  07/11/2011    Procedure: ESOPHAGOGASTRODUODENOSCOPY (EGD);  Surgeon: Beryle Beams, MD;  Location: [facility];  Service: Endoscopy;  Laterality: N/A;  . Colonoscopy  07/11/2011    Procedure: COLONOSCOPY;  Surgeon: Beryle Beams, MD;  Location: [facility] ENDOSCOPY;  Service: Endoscopy;  Laterality: N/A;  . Eye surgery Left     "trauma"  . Right colectomy  08/28/2011  . Esophagogastroduodenoscopy N/A 12/01/2012    Procedure: ESOPHAGOGASTRODUODENOSCOPY (EGD);  Surgeon: Irene Shipper, MD;  Location: [facility];  Service: Endoscopy;  Laterality: N/A;  . Colonoscopy with esophagogastroduodenoscopy (egd) Left 08/21/2013    Procedure: COLONOSCOPY WITH ESOPHAGOGASTRODUODENOSCOPY (EGD);  Surgeon: Beryle Beams, MD;  Location: [facility] ENDOSCOPY;  Service: Endoscopy;  Laterality:  Left;   Family History  Problem Relation Age of Onset  . Cancer Mother     Died from stomach cancer and "flesh eating rash  . Heart failure Father     Died in 73s from an MI  . Alcohol abuse Sister     Twin sister drinks a lot, as did both her parents and brothers  . Stroke Brother     Has 7 brothers, 1 with CVA   History  Substance Use Topics  . Smoking status: Former Smoker -- 0.50 packs/day for 40 years    Types: Cigarettes    Quit date: 09/20/2010  . Smokeless tobacco: Never Used  . Alcohol Use: Yes     Comment: "sips"    OB History   Grav Para Term  Preterm Abortions TAB SAB Ect Mult Living                 Review of Systems  All other systems reviewed and are negative.     Allergies  Amitriptyline hcl and Doxycycline hyclate  Home Medications   Prior to Admission medications   Medication Sig Start Date End Date Taking? Authorizing Bunny Kleist  amLODipine (NORVASC) 10 MG tablet Take 10 mg by mouth daily.  09/15/13  Yes Historical Leandro Berkowitz, MD  aspirin EC 81 MG tablet Take 1 tablet (81 mg total) by mouth every evening. 01/24/13  Yes Otho Bellows, MD  calcium carbonate (TUMS) 500 MG chewable tablet Chew 6 tablets (1,200 mg of elemental calcium total) by mouth 3 (three) times daily. For bone health 11/11/12  Yes Encarnacion Slates, NP  cetirizine (ZYRTEC) 10 MG tablet Take 1 tablet (10 mg total) by mouth daily. 01/30/13  Yes Otho Bellows, MD  diclofenac sodium (VOLTAREN) 1 % GEL Apply 2 g topically 2 (two) times daily. To affected area 08/27/13  Yes Jeralene Huff, MD  ferrous sulfate 325 (65 FE) MG tablet Take 1 tablet (325 mg total) by mouth 2 (two) times daily with a meal. 01/24/13  Yes Otho Bellows, MD  FLUoxetine (PROZAC) 10 MG capsule Take 1 capsule (10 mg total) by mouth daily. For depression 02/11/13  Yes Otho Bellows, MD  folic acid (FOLVITE) 1 MG tablet Take 1 tablet (1 mg total) by mouth daily. For folic acid replacement 11/06/13  Yes Sid Falcon, MD  gabapentin (NEURONTIN) 300 MG capsule Take 2 capsules (600 mg total) by mouth 3 (three) times daily. For anxiety/pain control 07/02/13  Yes Otho Bellows, MD  lipase/protease/amylase (CREON-12/PANCREASE) 12000 UNITS CPEP capsule Take 1 capsule by mouth 3 (three) times daily before meals. 04/10/13  Yes Otho Bellows, MD  magnesium oxide (MAG-OX) 400 MG tablet Take 1 tablet (400 mg total) by mouth 4 (four) times daily. 09/18/13  Yes Otho Bellows, MD  meclizine (ANTIVERT) 50 MG tablet Take 1 tablet (50 mg total) by mouth 3 (three) times daily as needed for dizziness.  01/15/13  Yes Clayton Bibles, PA-C  Multiple Vitamin (MULTIVITAMIN WITH MINERALS) TABS tablet Take 1 tablet by mouth daily. For vitamin replacement 04/10/13  Yes Otho Bellows, MD  omeprazole (PRILOSEC) 40 MG capsule Take 1 capsule (40 mg total) by mouth daily. 02/11/13  Yes Otho Bellows, MD  potassium chloride SA (KLOR-CON M20) 20 MEQ tablet Take 1 tablet (20 mEq total) by mouth daily. For low potassium 01/01/13  Yes Otho Bellows, MD  thiamine (VITAMIN B-1) 100 MG tablet Take 1 tablet (100 mg total) by mouth  daily. For low thiamine   Yes Otho Bellows, MD  vitamin B-12 (CYANOCOBALAMIN) 250 MCG tablet Take 1 tablet (250 mcg total) by mouth every evening. 04/10/13  Yes Otho Bellows, MD  Vitamin D, Ergocalciferol, (DRISDOL) 50000 UNITS CAPS capsule Take 1 capsule (50,000 Units total) by mouth every 7 (seven) days. Thursdays: For bone health 08/08/13  Yes Otho Bellows, MD  feeding supplement, ENSURE COMPLETE, (ENSURE COMPLETE) LIQD Take 237 mLs by mouth 3 (three) times daily between meals. 08/27/13   Jeralene Huff, MD   BP 116/84  Pulse 93  Temp(Src) 98.3 F (36.8 C) (Oral)  Resp 17  SpO2 100% Physical Exam  Nursing note and vitals reviewed. Constitutional: She appears well-developed and well-nourished. No distress.  HENT:  Head: Normocephalic and atraumatic.  Eyes: Pupils are equal, round, and reactive to light.  Neck: Normal range of motion. Neck supple.  Cardiovascular: Normal rate and regular rhythm.   Pulmonary/Chest: Effort normal. She has wheezes (diffuse expiratory). She has rales.  Coughing during exam, productive  Abdominal: Soft.  Musculoskeletal:  No lower extremity swelling.  Neurological: She is alert.  Skin: Skin is warm and dry.    ED Course  Procedures (including critical care time) Labs Review Labs Reviewed  CBC WITH DIFFERENTIAL - Abnormal; Notable for the following:    WBC 10.7 (*)    RBC 3.05 (*)    Hemoglobin 9.8 (*)    HCT 28.7 (*)     Monocytes Relative 18 (*)    Monocytes Absolute 1.9 (*)    All other components within normal limits  BASIC METABOLIC PANEL - Abnormal; Notable for the following:    Sodium 135 (*)    Potassium 3.4 (*)    CO2 13 (*)    Glucose, Bld 106 (*)    BUN 29 (*)    Creatinine, Ser 2.47 (*)    Calcium 8.2 (*)    GFR calc non Af Amer 21 (*)    GFR calc Af Amer 24 (*)    All other components within normal limits  I-STAT ARTERIAL BLOOD GAS, ED    Imaging Review Dg Chest 2 View  11/22/2013   CLINICAL DATA:  Two week history of cough  EXAM: CHEST  2 VIEW  COMPARISON:  Prior chest x-ray 09/25/2013  FINDINGS: Stable cardiac and mediastinal contours. Interval resolution of right lower lobe infiltrate compared to 09/25/2013. No new airspace consolidation. No pulmonary edema, pleural effusion or pneumothorax. No suspicious pulmonary nodule or mass. Stable background bronchitic changes. No acute osseous abnormality.  IMPRESSION: No active cardiopulmonary disease.  Interval resolution of right lower lobe infiltrate compared to 09/25/2013.   Electronically Signed   By: Jacqulynn Cadet M.D.   On: 11/22/2013 10:29     EKG Interpretation None      MDM   Final diagnoses:  Metabolic acidosis with respiratory acidosis  Post-tussive vomiting  Bronchitis   Patient given albuterol and atrovent nebulizer treatment with guaifenesin with codeine and tessalon Perls her coughing has significantly improved and she is feeling much better. Unfortunately her blood work shows that she has renal insuff, with a metabolic acidosis with superimposed respiratory acidosis.  I spoke with Internal Medicine Resident who have agreed to admit to the patient to [facility] for further treatment. Pts vitals stable in the ED.  Filed Vitals:   11/22/13 1200  BP: 116/84  Pulse: 93  Temp:   Resp:         Linus Mako, PA-C  11/22/13 1240 

## 2013-11-22 NOTE — Progress Notes (Signed)
Pt transported from ED on stretcher and placed in bed in room 6E10. NSL patent. LR @ 125 cc/hr initiated. No telemetry. No cbgs. VSS. Denies pain. Pt alert and oriented. Explained unit routines, staff introductions, Pt ambulated to bathroom without difficulty. Urine specimen obtained and walked to lab. Regular diet. Plan of care explained to patient. Pt in agreement, and anticipating possible d/c next day. General medical path initiated. Will continue to monitor. Manya Silvas, RN

## 2013-11-22 NOTE — H&P (Signed)
Date: 11/22/2013               Patient Name:  [name] [name] MRN: [id number]  DOB: February 26, 1956 Age / Sex: 58 y.o., female   PCP: Otho Bellows, MD         Medical Service: Internal Medicine Teaching Service         Attending Physician: Dr. Madilyn Fireman, MD    First Contact: Dr. Ronnald Ramp Pager: [phone]  Second Contact: Dr. Alice Rieger Pager: [phone]       After Hours (After 5p/  First Contact Pager: [phone]  weekends / holidays): Second Contact Pager: [phone]   Chief Complaint: Cough, nausea, vomiting  History of Present Illness: [name] [name] is a 58 y.o. 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 The patient claims she has had a cough for 5-6 days, productive of a white "foam", but no significant sputum production. She denies fever or chills at home. She claims she has had a significant enough cough w/ vomiting that she is unable to keep much down, therefore has not had much to eat in the past 2-3 days and has not taken her home medications over the same period of time. She denies significant SOB, or chest pain, no sore throat, abdominal pain, dysuria, flank pain, diarrhea, LE swelling, dizziness, lightheadedness, or palpitations. 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  Today, the patient denies recent alcohol use, saying she has not been drinking for the past 3-4 months. Last night she claims she had a small sip of wine to help her sleep, but says she could not keep it down because of her coughing and post-tussive vomiting.  On exam, the patient appears comfortable, not coughing. In the ED, found to have mild  hypokalemia, AKI, and worsened AG acidosis. CXR shows no active cardiopulmonary disease.    Meds: Current Facility-Administered Medications  Medication Dose Route Frequency Provider Last Rate Last Dose  . 0.9 %  sodium chloride infusion   Intravenous STAT Tiffany G Greene, PA-C      . sodium chloride 0.9 % bolus 500 mL  500 mL Intravenous Once Linus Mako, PA-C       Current Outpatient Prescriptions  Medication Sig Dispense Refill  . amLODipine (NORVASC) 10 MG tablet Take 10 mg by mouth daily.       Marland Kitchen aspirin EC 81 MG tablet Take 1 tablet (81 mg total) by mouth every evening.  90 tablet  3  . calcium carbonate (TUMS) 500 MG chewable tablet Chew 6 tablets (1,200 mg of elemental calcium total) by mouth 3 (three) times daily. For bone health  90 tablet  3  . cetirizine (ZYRTEC) 10 MG tablet Take 1 tablet (10 mg total) by mouth daily.  90 tablet  3  . diclofenac sodium (VOLTAREN) 1 % GEL Apply 2 g topically 2 (two) times daily. To affected area  100 g  0  . ferrous sulfate 325 (65 FE) MG tablet Take 1 tablet (325 mg total) by mouth 2 (two) times daily with a meal.  180 tablet  3  . FLUoxetine (PROZAC) 10 MG capsule Take 1 capsule (10 mg total) by mouth  daily. For depression  90 capsule  4  . folic acid (FOLVITE) 1 MG tablet Take 1 tablet (1 mg total) by mouth daily. For folic acid replacement  30 tablet  3  . gabapentin (NEURONTIN) 300 MG capsule Take 2 capsules (600 mg total) by mouth 3 (three) times daily. For anxiety/pain control  540 capsule  1  . lipase/protease/amylase (CREON-12/PANCREASE) 12000 UNITS CPEP capsule Take 1 capsule by mouth 3 (three) times daily before meals.  270 capsule  3  . magnesium oxide (MAG-OX) 400 MG tablet Take 1 tablet (400 mg total) by mouth 4 (four) times daily.  360 tablet  6  . meclizine (ANTIVERT) 50 MG tablet Take 1 tablet (50 mg total) by mouth 3 (three) times daily as needed for dizziness.  30 tablet  0  . Multiple Vitamin (MULTIVITAMIN WITH MINERALS)  TABS tablet Take 1 tablet by mouth daily. For vitamin replacement  90 tablet  3  . omeprazole (PRILOSEC) 40 MG capsule Take 1 capsule (40 mg total) by mouth daily.  90 capsule  4  . potassium chloride SA (KLOR-CON M20) 20 MEQ tablet Take 1 tablet (20 mEq total) by mouth daily. For low potassium  90 tablet  0  . thiamine (VITAMIN B-1) 100 MG tablet Take 1 tablet (100 mg total) by mouth daily. For low thiamine  90 tablet  3  . vitamin B-12 (CYANOCOBALAMIN) 250 MCG tablet Take 1 tablet (250 mcg total) by mouth every evening.  90 tablet  3  . Vitamin D, Ergocalciferol, (DRISDOL) 50000 UNITS CAPS capsule Take 1 capsule (50,000 Units total) by mouth every 7 (seven) days. Thursdays: For bone health  4 capsule  3  . feeding supplement, ENSURE COMPLETE, (ENSURE COMPLETE) LIQD Take 237 mLs by mouth 3 (three) times daily between meals.  90 Bottle  1    Allergies: Allergies as of 11/22/2013 - Review Complete 11/22/2013  Allergen Reaction Noted  . Amitriptyline hcl Swelling   . Doxycycline hyclate Itching 11/10/2010   Past Medical History  Diagnosis Date  . Anemia, B12 deficiency   . History of acute pancreatitis   . Right knee pain     No recent imaging on chart  . 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 Dr. Mancel Bale, [facility]  . Hypertriglyceridemia   . GERD (gastroesophageal reflux disease)   . Vitamin D deficiency   . Subdural hematoma 02/2008    Likely 2/2 trauma from seizure from EtOH withdrawal, chronic in nature, sees Dr. Jerene Bears. Most recent CT head 10/2009 showing stable but persistent hematoma without mass effect.  . History of seizure disorder     Likely 2/2 alcohol abuse  . Hypocalcemia   . Hypomagnesemia   . Failure to thrive in childhood     Unclear etiology  . HTN (hypertension)   . Thrombocytopenia   . Anemia, macrocytic   . Hepatomegaly     On exam  . Joint pain   . Alcohol abuse   . Vitamin D deficiency    . Menopause   . Pancreatitis   . Insomnia   . Hyperlipidemia   . Sinusitis   . Pernicious anemia   . Macrocytic anemia   . 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 Depression   . Fx humeral neck 04/17/2011    Transverse fracture- minimally displaced- managed as outpatient   . ABNORMAL PAP SMEAR, LGSIL 07/23/2008    Annotation:  HPV positive CIN I Dr. Mancel Bale, [facility] Qualifier: Diagnosis of  By: Oretha Ellis    . Pneumonia 05/20/2012  . Seizures     "last one was in 2013; don't know what kind" (08/15/2013)  . Arthritis     "shoulders" (08/15/2013)   Past Surgical History  Procedure Laterality Date  . Cesarean section  1983  . Esophagogastroduodenoscopy  07/11/2011    Procedure: ESOPHAGOGASTRODUODENOSCOPY (EGD);  Surgeon: Beryle Beams, MD;  Location: [facility];  Service: Endoscopy;  Laterality: N/A;  . Colonoscopy  07/11/2011    Procedure: COLONOSCOPY;  Surgeon: Beryle Beams, MD;  Location: [facility]OSCOPY;  Service: Endoscopy;  Laterality: N/A;  . Eye surgery Left     "trauma"  . Right colectomy  08/28/2011  . Esophagogastroduodenoscopy N/A 12/01/2012    Procedure: ESOPHAGOGASTRODUODENOSCOPY (EGD);  Surgeon: Irene Shipper, MD;  Location: [facility];  Service: Endoscopy;  Laterality: N/A;  . Colonoscopy with esophagogastroduodenoscopy (egd) Left 08/21/2013    Procedure: COLONOSCOPY WITH ESOPHAGOGASTRODUODENOSCOPY (EGD);  Surgeon: Beryle Beams, MD;  Location: [facility];  Service: Endoscopy;  Laterality: Left;   Family History  Problem Relation Age of Onset  . Cancer Mother     Died from stomach cancer and "flesh eating rash  . Heart failure Father     Died in 70s from an MI  . Alcohol abuse Sister     Twin sister drinks a lot, as did both her parents and brothers  . Stroke Brother     Has 7 brothers, 1 with CVA   History   Social History  . Marital Status: Divorced    Spouse Name: N/A    Number of Children: N/A  . Years of Education: N/A    Occupational History  . Not on file.   Social History Main Topics  . Smoking status: Former Smoker -- 0.50 packs/day for 40 years    Types: Cigarettes    Quit date: 09/20/2010  . Smokeless tobacco: Never Used  . Alcohol Use: Yes     Comment: "sips"   . Drug Use: Yes    Special: Marijuana, Cocaine     Comment: 08/15/2013 "last drug use was in 2012"  . Sexual Activity: Yes   Other Topics Concern  . Not on file   Social History Narrative   Lives with her significant other and 2 grandchildren. 1 child   Has 7 brothers and 4 sisters, 1 twin sister.   Unemployed, worked in [employer].    Abuses alcohol-drinks 1 glass of wine daily    No drug use. Former cigarette use quit 1.5 years ago.     11 th grade education             Review of Systems: General: Positive for chills. Denies fever, diaphoresis, appetite change and fatigue.  Respiratory: Positive for cough. Denies SOB, DOE, chest tightness, and wheezing.   Cardiovascular: Denies chest pain and palpitations.  Gastrointestinal: Positive for nausea and vomiting. Denies abdominal pain, diarrhea, constipation, blood in stool and abdominal distention.  Genitourinary: Denies dysuria, urgency, frequency, hematuria, and flank pain. Endocrine: Denies hot or cold intolerance, polyuria, and polydipsia. Musculoskeletal: Denies myalgias, back pain, joint swelling, arthralgias and gait problem.  Skin: Denies pallor, rash and wounds.  Neurological: Denies dizziness, seizures, syncope, weakness, lightheadedness, numbness and headaches.  Psychiatric/Behavioral: Denies mood changes, confusion, nervousness, sleep disturbance and agitation.    Physical Exam: Filed Vitals:   11/22/13 1000 11/22/13 1035 11/22/13 1130 11/22/13  1200  BP: 99/70  113/80 116/84  Pulse: 89  94 93  Temp:      TempSrc:      Resp: 17     SpO2: 99% 99% 99% 100%   General: Vital signs reviewed.  Patient is a very thin elderly female, in no acute distress and  cooperative with exam.  Head: Normocephalic and atraumatic.  Eyes: PERRL, EOMI, conjunctivae normal, No scleral icterus.  Neck: Supple, trachea midline, normal ROM, No JVD, masses, thyromegaly, or carotid bruit present. No tender lymphadenopathy. Cardiovascular: RRR, S1 normal, S2 normal, no murmurs, gallops, or rubs. Pulmonary/Chest: Air entry equal bilaterally, no wheezes, rales, or rhonchi. Abdominal: Soft, non-tender, non-distended, BS +, no masses, organomegaly, or guarding present.  Musculoskeletal: No joint deformities, erythema, or stiffness, ROM full and nontender. Extremities: No swelling or edema,  pulses symmetric and intact bilaterally. No cyanosis. Clubbing present.  Neurological: A&O x3, Strength is normal and symmetric bilaterally, cranial nerve II-XII are grossly intact, no focal motor deficit, sensory intact to light touch bilaterally.  Skin: Warm, dry and intact. No rashes or erythema. Psychiatric: Normal mood and affect. speech and behavior is normal. Cognition and memory are normal.     Lab results: Basic Metabolic Panel:  Recent Labs  11/22/13 1110  NA 135*  K 3.4*  CL 102  CO2 13*  GLUCOSE 106*  BUN 29*  CREATININE 2.47*  CALCIUM 8.2*   Liver Function Tests: No results found for this basename: AST, ALT, ALKPHOS, BILITOT, PROT, ALBUMIN,  in the last 72 hours No results found for this basename: LIPASE, AMYLASE,  in the last 72 hours No results found for this basename: AMMONIA,  in the last 72 hours CBC:  Recent Labs  11/22/13 1110  WBC 10.7*  NEUTROABS 5.1  HGB 9.8*  HCT 28.7*  MCV 94.1  PLT 225   Urinalysis: No results found for this basename: COLORURINE, APPERANCEUR, LABSPEC, PHURINE, GLUCOSEU, HGBUR, BILIRUBINUR, KETONESUR, PROTEINUR, UROBILINOGEN, NITRITE, LEUKOCYTESUR,  in the last 72 hours   Imaging results:  Dg Chest 2 View  11/22/2013   CLINICAL DATA:  Two week history of cough  EXAM: CHEST  2 VIEW  COMPARISON:  Prior chest x-ray  09/25/2013  FINDINGS: Stable cardiac and mediastinal contours. Interval resolution of right lower lobe infiltrate compared to 09/25/2013. No new airspace consolidation. No pulmonary edema, pleural effusion or pneumothorax. No suspicious pulmonary nodule or mass. Stable background bronchitic changes. No acute osseous abnormality.  IMPRESSION: No active cardiopulmonary disease.  Interval resolution of right lower lobe infiltrate compared to 09/25/2013.   Electronically Signed   By: Jacqulynn Cadet M.D.   On: 11/22/2013 10:29    Other results: EKG: pending  Assessment & Plan by Problem: [name] is a 58 y.o. 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  Acute on CKD- Patient w/ baseline CKD stage III, had a renal cortical biopsy in 10/2013 which was suggestive of FSGS. Follows w/ Dr. Justin Mend. AKI most likely 2/2 pre-renal etiology in the setting of poor po intake and vomiting.  -IVF's; LR @ 125 cc/hr for 20 hours -UA pending -BMP in AM  AG Metabolic Acidosis- Patient w/ chronic anion gap acidosis in the setting of worsening renal function, baseline HCO3 ~16-18. On admission, HCO3 13 w/ AG of 20 on admission, likely 2/2 starvation  ketoacidosis. Patient denies significant alcohol use, however, has an extensive h/o alcohol abuse, therefore alcoholic ketoacidosis may also be a contributor in worsened AG acidosis.  -UA as above -IVF's as above; If continues to be significantly acidotic, can add NaHCO3.   Acute Viral Bronchitis- Patient w/ significant non-productive cough for the past 6 days, resulting in post-tussive vomiting. CXR performed in the ED shows NACPD. Patient admitted in 08/2013 for a suspected aspiration pneumonia and patient is thought to have chronic aspiration. Patient is  currently afebrile, no sore throat, pharyngeal erythema, SOB, chest pain, rhinorrhea, fever, or chills. Patient w/ very mild leukocytosis of 10.7 on admission.  -Guafenesin-codeine 5 mL q4h prn for cough -Phenergan prn for vomiting  Alcohol Abuse- Patient has an extensive h/o alcohol abuse, has had withdrawal related seizures in the past. Claims she has not been drinking alcohol for the past 3-4 months, but says she had a small drink of wine last night, though she was unable to keep it down 2/2 her vomiting. Still some concern for active alcohol use.  -CIWA protocol  -IVF's as above -Replete electrolytes as discussed below -Continue home vitamins (thiamine, B12) -Continue Creon  Hypokalemia/hypomagnesemia- Patient admitted w/ K of 3.4, magnesium level pending. Likely d/t alcohol abuse. FeMg calculated in the past, shown to be 20%, significant for renal magnesium wasting d/t tubular dysfunction in the setting of alcohol toxicity.  -Replete as necessary -Repeat BMP and Mag in AM -EKG pending -Continue home Magnesium Oxide 400 mg qid  Anemia of Chronic Disease- Baseline Hb ~9.0, today 9.8, MCV 94.1. Previously macrocytic most likely 2/2 alcohol abuse. Etiology multifactorial at this time.  -Continue to monitor -Continue home ferrous sulfate 325 mg bid -CBC in AM  HTN- Patient normotensive on admission. Takes Norvasc 10 mg qd at home. Says she has not taken this in 3 days 2/2 vomiting. -Continue home Norvasc  Dispo: Disposition is deferred at this time, awaiting improvement of current medical problems. Anticipated discharge in approximately 1-2 day(s).   The patient does have a current PCP Otho Bellows, MD) and does need an [facility] hospital follow-up appointment after discharge.  The patient does not have transportation limitations that hinder transportation to clinic appointments.  Signed: Corky Sox, MD 11/22/2013, 12:40 PM

## 2013-11-22 NOTE — ED Notes (Signed)
Internal Medicine at bedside.  

## 2013-11-22 NOTE — Plan of Care (Signed)
Problem: Phase I Progression Outcomes Goal: Pain controlled with appropriate interventions Outcome: Completed/Met Date Met:  11/22/13 Pt denies pain at this time.

## 2013-11-22 NOTE — Plan of Care (Signed)
Problem: Phase I Progression Outcomes Goal: Hemodynamically stable Outcome: Completed/Met Date Met:  11/22/13 vss!

## 2013-11-23 DIAGNOSIS — J209 Acute bronchitis, unspecified: Secondary | ICD-10-CM | POA: Diagnosis present

## 2013-11-23 DIAGNOSIS — R059 Cough, unspecified: Secondary | ICD-10-CM | POA: Diagnosis present

## 2013-11-23 DIAGNOSIS — E2749 Other adrenocortical insufficiency: Secondary | ICD-10-CM | POA: Diagnosis present

## 2013-11-23 DIAGNOSIS — E872 Acidosis, unspecified: Secondary | ICD-10-CM | POA: Diagnosis present

## 2013-11-23 DIAGNOSIS — G589 Mononeuropathy, unspecified: Secondary | ICD-10-CM | POA: Diagnosis present

## 2013-11-23 DIAGNOSIS — E871 Hypo-osmolality and hyponatremia: Secondary | ICD-10-CM | POA: Diagnosis not present

## 2013-11-23 DIAGNOSIS — I5033 Acute on chronic diastolic (congestive) heart failure: Secondary | ICD-10-CM | POA: Diagnosis present

## 2013-11-23 DIAGNOSIS — E875 Hyperkalemia: Secondary | ICD-10-CM | POA: Diagnosis not present

## 2013-11-23 DIAGNOSIS — J9600 Acute respiratory failure, unspecified whether with hypoxia or hypercapnia: Secondary | ICD-10-CM | POA: Diagnosis present

## 2013-11-23 DIAGNOSIS — Z7982 Long term (current) use of aspirin: Secondary | ICD-10-CM | POA: Diagnosis not present

## 2013-11-23 DIAGNOSIS — Z87891 Personal history of nicotine dependence: Secondary | ICD-10-CM | POA: Diagnosis not present

## 2013-11-23 DIAGNOSIS — D631 Anemia in chronic kidney disease: Secondary | ICD-10-CM | POA: Diagnosis present

## 2013-11-23 DIAGNOSIS — E785 Hyperlipidemia, unspecified: Secondary | ICD-10-CM | POA: Diagnosis present

## 2013-11-23 DIAGNOSIS — F1020 Alcohol dependence, uncomplicated: Secondary | ICD-10-CM | POA: Diagnosis present

## 2013-11-23 DIAGNOSIS — R7309 Other abnormal glucose: Secondary | ICD-10-CM | POA: Diagnosis present

## 2013-11-23 DIAGNOSIS — N183 Chronic kidney disease, stage 3 unspecified: Secondary | ICD-10-CM | POA: Diagnosis present

## 2013-11-23 DIAGNOSIS — F3289 Other specified depressive episodes: Secondary | ICD-10-CM | POA: Diagnosis present

## 2013-11-23 DIAGNOSIS — K7030 Alcoholic cirrhosis of liver without ascites: Secondary | ICD-10-CM | POA: Diagnosis present

## 2013-11-23 DIAGNOSIS — M19019 Primary osteoarthritis, unspecified shoulder: Secondary | ICD-10-CM | POA: Diagnosis present

## 2013-11-23 DIAGNOSIS — F1010 Alcohol abuse, uncomplicated: Secondary | ICD-10-CM | POA: Diagnosis not present

## 2013-11-23 DIAGNOSIS — D696 Thrombocytopenia, unspecified: Secondary | ICD-10-CM | POA: Diagnosis present

## 2013-11-23 DIAGNOSIS — N179 Acute kidney failure, unspecified: Secondary | ICD-10-CM | POA: Diagnosis present

## 2013-11-23 DIAGNOSIS — E876 Hypokalemia: Secondary | ICD-10-CM | POA: Diagnosis present

## 2013-11-23 DIAGNOSIS — Z8611 Personal history of tuberculosis: Secondary | ICD-10-CM | POA: Diagnosis not present

## 2013-11-23 DIAGNOSIS — I129 Hypertensive chronic kidney disease with stage 1 through stage 4 chronic kidney disease, or unspecified chronic kidney disease: Secondary | ICD-10-CM | POA: Diagnosis present

## 2013-11-23 DIAGNOSIS — D72829 Elevated white blood cell count, unspecified: Secondary | ICD-10-CM | POA: Diagnosis present

## 2013-11-23 DIAGNOSIS — R05 Cough: Secondary | ICD-10-CM | POA: Diagnosis present

## 2013-11-23 DIAGNOSIS — N269 Renal sclerosis, unspecified: Secondary | ICD-10-CM | POA: Diagnosis present

## 2013-11-23 DIAGNOSIS — I509 Heart failure, unspecified: Secondary | ICD-10-CM | POA: Diagnosis present

## 2013-11-23 DIAGNOSIS — K219 Gastro-esophageal reflux disease without esophagitis: Secondary | ICD-10-CM | POA: Diagnosis present

## 2013-11-23 DIAGNOSIS — F329 Major depressive disorder, single episode, unspecified: Secondary | ICD-10-CM | POA: Diagnosis present

## 2013-11-23 DIAGNOSIS — Z79899 Other long term (current) drug therapy: Secondary | ICD-10-CM | POA: Diagnosis not present

## 2013-11-23 LAB — COMPREHENSIVE METABOLIC PANEL
ALBUMIN: 2.2 g/dL — AB (ref 3.5–5.2)
ALT: 9 U/L (ref 0–35)
AST: 24 U/L (ref 0–37)
Alkaline Phosphatase: 231 U/L — ABNORMAL HIGH (ref 39–117)
BUN: 29 mg/dL — ABNORMAL HIGH (ref 6–23)
CHLORIDE: 104 meq/L (ref 96–112)
CO2: 15 mEq/L — ABNORMAL LOW (ref 19–32)
CREATININE: 2.13 mg/dL — AB (ref 0.50–1.10)
Calcium: 8.1 mg/dL — ABNORMAL LOW (ref 8.4–10.5)
GFR calc Af Amer: 29 mL/min — ABNORMAL LOW (ref >90)
GFR calc non Af Amer: 25 mL/min — ABNORMAL LOW (ref >90)
Glucose, Bld: 112 mg/dL — ABNORMAL HIGH (ref 70–99)
Potassium: 4.1 mEq/L (ref 3.7–5.3)
Sodium: 137 mEq/L (ref 137–147)
TOTAL PROTEIN: 7.5 g/dL (ref 6.0–8.3)

## 2013-11-23 LAB — CBC
HEMATOCRIT: 22.9 % — AB (ref 36.0–46.0)
HEMOGLOBIN: 7.8 g/dL — AB (ref 12.0–15.0)
MCH: 32.2 pg (ref 26.0–34.0)
MCHC: 34.1 g/dL (ref 30.0–36.0)
MCV: 94.6 fL (ref 78.0–100.0)
PLATELETS: 191 10*3/uL (ref 150–400)
RBC: 2.42 MIL/uL — ABNORMAL LOW (ref 3.87–5.11)
RDW: 14 % (ref 11.5–15.5)
WBC: 10.7 10*3/uL — AB (ref 4.0–10.5)

## 2013-11-23 LAB — MAGNESIUM: Magnesium: 1.5 mg/dL (ref 1.5–2.5)

## 2013-11-23 MED ORDER — LACTATED RINGERS IV SOLN
INTRAVENOUS | Status: AC
  Administered 2013-11-23 – 2013-11-24 (×3): via INTRAVENOUS

## 2013-11-23 MED ORDER — SODIUM BICARBONATE 650 MG PO TABS
650.0000 mg | ORAL_TABLET | Freq: Two times a day (BID) | ORAL | Status: DC
  Administered 2013-11-23 – 2013-11-25 (×5): 650 mg via ORAL
  Filled 2013-11-23 (×6): qty 1

## 2013-11-23 MED ORDER — MAGNESIUM SULFATE 40 MG/ML IJ SOLN
2.0000 g | Freq: Once | INTRAMUSCULAR | Status: AC
  Administered 2013-11-23: 2 g via INTRAVENOUS
  Filled 2013-11-23: qty 50

## 2013-11-23 MED ORDER — ACETAMINOPHEN 325 MG PO TABS
650.0000 mg | ORAL_TABLET | Freq: Four times a day (QID) | ORAL | Status: DC | PRN
  Administered 2013-11-23 – 2013-11-24 (×3): 650 mg via ORAL
  Filled 2013-11-23 (×3): qty 2

## 2013-11-23 NOTE — ED Provider Notes (Signed)
Medical screening examination/treatment/procedure(s) were performed by non-physician practitioner and as supervising physician I was immediately available for consultation/collaboration.     Veryl Speak, MD 11/23/13 [phone]

## 2013-11-23 NOTE — Progress Notes (Signed)
Subjective: Patient seen at bedside this AM, says she is feeling very well today, slept well overnight, no new complaints. Cough well controlled.   Objective: Vital signs in last 24 hours: Filed Vitals:   11/22/13 2133 11/23/13 0424 11/23/13 0430 11/23/13 0933  BP: 99/43  98/66 109/72  Pulse: 105  99 114  Temp: 98.1 F (36.7 C) 100.2 F (37.9 C)  99.6 F (37.6 C)  TempSrc: Oral Oral  Oral  Resp: 17  18 18   Height: 5\' 1"  (1.549 m)     Weight: 89 lb 3.2 oz (40.461 kg)     SpO2: 100%  98% 96%   Weight change:   Intake/Output Summary (Last 24 hours) at 11/23/13 1209 Last data filed at 11/23/13 0900  Gross per 24 hour  Intake 2289.17 ml  Output   1000 ml  Net 1289.17 ml   Physical Exam: General: Alert, cooperative, NAD. HEENT: PERRL, EOMI. Moist mucus membranes Neck: Full range of motion without pain, supple, no lymphadenopathy or carotid bruits Lungs: Clear to ascultation bilaterally, normal work of respiration, no wheezes, rales, rhonchi Heart: RRR, no murmurs, gallops, or rubs Abdomen: Soft, non-tender, non-distended, BS + Extremities: No cyanosis or edema. Clubbing present. Neurologic: Alert & oriented X3, cranial nerves II-XII intact, strength grossly intact, sensation intact to light touch   Lab Results: Basic Metabolic Panel:  Recent Labs Lab 11/22/13 1110 11/22/13 1450 11/23/13 0512  NA 135*  --  137  K 3.4*  --  4.1  CL 102  --  104  CO2 13*  --  15*  GLUCOSE 106*  --  112*  BUN 29*  --  29*  CREATININE 2.47*  --  2.13*  CALCIUM 8.2*  --  8.1*  MG  --  1.1* 1.5  PHOS  --  4.5  --    Liver Function Tests:  Recent Labs Lab 11/22/13 1450 11/23/13 0512  AST 37 24  ALT 13 9  ALKPHOS 287* 231*  BILITOT 0.3 <0.2*  PROT 9.1* 7.5  ALBUMIN 2.8* 2.2*   CBC:  Recent Labs Lab 11/22/13 1110 11/23/13 0512  WBC 10.7* 10.7*  NEUTROABS 5.1  --   HGB 9.8* 7.8*  HCT 28.7* 22.9*  MCV 94.1 94.6  PLT 225 191   Urine Drug Screen: Drugs of Abuse    Component Value Date/Time   LABOPIA POSITIVE* 11/22/2013 1406   LABOPIA NEG 09/18/2011 0936   COCAINSCRNUR NONE DETECTED 11/22/2013 1406   COCAINSCRNUR NEG 09/18/2011 0936   LABBENZ NONE DETECTED 11/22/2013 1406   LABBENZ NEG 09/18/2011 0936   LABBENZ NEG 04/10/2011 1130   AMPHETMU NONE DETECTED 11/22/2013 1406   AMPHETMU NEG 04/10/2011 1130   THCU NONE DETECTED 11/22/2013 1406   LABBARB NONE DETECTED 11/22/2013 1406   LABBARB NEG 09/18/2011 0936    Urinalysis:  Recent Labs Lab 11/22/13 1406  COLORURINE YELLOW  LABSPEC 1.015  PHURINE 5.5  GLUCOSEU NEGATIVE  HGBUR NEGATIVE  BILIRUBINUR NEGATIVE  KETONESUR NEGATIVE  PROTEINUR 100*  UROBILINOGEN 0.2  NITRITE NEGATIVE  LEUKOCYTESUR MODERATE*   Studies/Results: Dg Chest 2 View  11/22/2013   CLINICAL DATA:  Two week history of cough  EXAM: CHEST  2 VIEW  COMPARISON:  Prior chest x-ray 09/25/2013  FINDINGS: Stable cardiac and mediastinal contours. Interval resolution of right lower lobe infiltrate compared to 09/25/2013. No new airspace consolidation. No pulmonary edema, pleural effusion or pneumothorax. No suspicious pulmonary nodule or mass. Stable background bronchitic changes. No acute osseous abnormality.  IMPRESSION: No  active cardiopulmonary disease.  Interval resolution of right lower lobe infiltrate compared to 09/25/2013.   Electronically Signed   By: Jacqulynn Cadet M.D.   On: 11/22/2013 10:29   Medications: I have reviewed the patient's current medications. Scheduled Meds: . amLODipine  10 mg Oral Daily  . aspirin EC  81 mg Oral QPM  . feeding supplement (ENSURE COMPLETE)  237 mL Oral TID BM  . ferrous sulfate  325 mg Oral BID WC  . FLUoxetine  10 mg Oral Daily  . folic acid  1 mg Oral Daily  . gabapentin  600 mg Oral TID  . lipase/protease/amylase  1 capsule Oral TID AC  . magnesium oxide  400 mg Oral QID  . pantoprazole  40 mg Oral Daily  . potassium chloride SA  20 mEq Oral Daily  . sodium bicarbonate  650 mg Oral BID    . thiamine  100 mg Oral Daily  . vitamin B-12  250 mcg Oral QPM   Continuous Infusions: . lactated ringers     PRN Meds:.acetaminophen, guaiFENesin-codeine, promethazine  Assessment/Plan: [name] is a 58 y.o. 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   Acute on CKD- Patient w/ baseline CKD stage III, had a renal cortical biopsy in 10/2013 which was suggestive of FSGS. AKI most likely 2/2 pre-renal etiology d/t poor po intake and vomiting. Cr somewhat improved this AM, trend as follows:   Recent Labs Lab 11/22/13 1110 11/23/13 0512  CREATININE 2.47* 2.13*  -Increase LR to 150 cc/hr -Continue to replete electrolytes as necessary -Repeat BMP in AM -Start NaHCO3 po as below  AG Metabolic Acidosis- Patient w/ chronic anion gap acidosis in the setting of worsening renal function, baseline HCO3 ~16-18. On admission, HCO3 13 w/ AG of 20 on admission. This AM, HCO3 15, AG 18, only mild improvement.  -Increase LR to 150 cc/hr -Start NaHCO3 650 mg po bid for now -Repeat BMP in AM -Patient will need renal follow up on discharge.  Acute Viral Bronchitis- Improved symptoms this AM, says her cough has improved, well controlled w/ Robitussin AC. -Guafenesin-codeine 5 mL q4h prn for cough  -Phenergan prn for vomiting   Alcohol Abuse- Patient claims she has been abstinent from alcohol, however, some concern she is still drinking.  -CIWA protocol  -IVF's as above  -Replete electrolytes as discussed below  -Continue home vitamins (thiamine, B12)  -Continue Creon   Hypokalemia/hypomagnesemia- K 4.1 this AM. Mag 1.5 -Replete as necessary  -Repeat BMP and Mag in AM  -Continue home Magnesium Oxide 400 mg qid   Anemia of Chronic Disease- Baseline Hb ~9.0. Found to be 7.8 this  morning, most likely 2/2 hemodilution.  -Continue to monitor  -Continue home ferrous sulfate 325 mg bid  -CBC in AM   HTN- Normotensive.  -Continue home Norvasc  Dispo: Disposition is deferred at this time, awaiting improvement of current medical problems.  Anticipated discharge in approximately 1-2 day(s).   The patient does have a current PCP Otho Bellows, MD) and does need an [facility] hospital follow-up appointment after discharge.  The patient does not have transportation limitations that hinder transportation to clinic appointments.  .Services Needed at time of discharge: Y = Yes, Blank = No PT:   OT:   RN:   Equipment:   Other:  LOS: 1 day   Corky Sox, MD 11/23/2013, 12:09 PM

## 2013-11-23 NOTE — H&P (Signed)
INTERNAL MEDICINE TEACHING ATTENDING NOTE  Day 1 of stay  Patient name: [name] [name]  MRN: [id number] Date of birth: [birth year]   58 y.o. with history of alcoholism, and multiple medical problems related to it, coming in with cough and chest pain. I have read the HPI by Dr Ronnald Ramp and I agree with it.    Physical Exam   Stable vitals. No new complaints.   General: Resting in bed. No cute distress.  HEENT: PERRL, EOMI, no scleral icterus, no lymphadenopathy. Pharynx: Posterior wall erythematous, no enlarged tonsils.  Heart: Regular tachycardia, no rubs, murmurs or gallops. Lungs: Velcro rales at bases. No rhonchi. No wheezes.  Abdomen: Soft, nontender, nondistended, BS present. Extremities: Warm, no pedal edema. Neuro: Alert and oriented X3, cranial nerves II-XII grossly intact,  strength and sensation to light touch equal in bilateral upper and lower extremities  I have reviewed her labs and imaging.   Assessment and Plan   Acute on chronic CKD - Likely prerenal, secondary to ongoing vomiting (4-5 times/day for 5 days per patient). Agree with Dr Ronnald Ramp' management. Getting better with fluids.   Recent Labs Lab 11/22/13 1110 11/23/13 0512  BUN 29* 29*  CREATININE 2.47* 3.15*   AG metabolic acidosis - Likely cause chronic renal disease, with worsening AKI. At present the patient does not look septic, her urine was negative for ketones, she denies drinking any kind of alcohol, and she has had a low-low normal bicarb chronically which supports chronic kidney disease related acidosis.   Cough - Unsure etiology - likely bronchitis. Does not appear to be pneumonia or aspiration. Reports getting better with Robitussin  I have seen and evaluated this patient and discussed it with my IM resident team.  Please see the rest of the plan per resident note from today.   [place], [place] 11/23/2013, 11:11 AM.

## 2013-11-23 NOTE — Progress Notes (Signed)
Utilization review completed.  

## 2013-11-24 ENCOUNTER — Inpatient Hospital Stay (HOSPITAL_COMMUNITY): Payer: PRIVATE HEALTH INSURANCE

## 2013-11-24 DIAGNOSIS — N179 Acute kidney failure, unspecified: Principal | ICD-10-CM

## 2013-11-24 DIAGNOSIS — E874 Mixed disorder of acid-base balance: Secondary | ICD-10-CM

## 2013-11-24 DIAGNOSIS — J9600 Acute respiratory failure, unspecified whether with hypoxia or hypercapnia: Secondary | ICD-10-CM

## 2013-11-24 DIAGNOSIS — I959 Hypotension, unspecified: Secondary | ICD-10-CM

## 2013-11-24 DIAGNOSIS — J810 Acute pulmonary edema: Secondary | ICD-10-CM

## 2013-11-24 DIAGNOSIS — F1010 Alcohol abuse, uncomplicated: Secondary | ICD-10-CM

## 2013-11-24 DIAGNOSIS — E872 Acidosis, unspecified: Secondary | ICD-10-CM

## 2013-11-24 DIAGNOSIS — E2749 Other adrenocortical insufficiency: Secondary | ICD-10-CM

## 2013-11-24 DIAGNOSIS — N183 Chronic kidney disease, stage 3 unspecified: Secondary | ICD-10-CM

## 2013-11-24 DIAGNOSIS — J209 Acute bronchitis, unspecified: Secondary | ICD-10-CM

## 2013-11-24 DIAGNOSIS — I319 Disease of pericardium, unspecified: Secondary | ICD-10-CM

## 2013-11-24 DIAGNOSIS — I129 Hypertensive chronic kidney disease with stage 1 through stage 4 chronic kidney disease, or unspecified chronic kidney disease: Secondary | ICD-10-CM

## 2013-11-24 DIAGNOSIS — D638 Anemia in other chronic diseases classified elsewhere: Secondary | ICD-10-CM

## 2013-11-24 LAB — BLOOD GAS, ARTERIAL
Acid-base deficit: 12 mmol/L — ABNORMAL HIGH (ref 0.0–2.0)
BICARBONATE: 13.3 meq/L — AB (ref 20.0–24.0)
Drawn by: 225631
O2 Content: 6 L/min
O2 Saturation: 88.2 %
Patient temperature: 98.6
TCO2: 14.2 mmol/L (ref 0–100)
pCO2 arterial: 29 mmHg — ABNORMAL LOW (ref 35.0–45.0)
pH, Arterial: 7.285 — ABNORMAL LOW (ref 7.350–7.450)
pO2, Arterial: 59.7 mmHg — ABNORMAL LOW (ref 80.0–100.0)

## 2013-11-24 LAB — CBC
HEMATOCRIT: 24.1 % — AB (ref 36.0–46.0)
HEMOGLOBIN: 8.1 g/dL — AB (ref 12.0–15.0)
MCH: 32 pg (ref 26.0–34.0)
MCHC: 33.6 g/dL (ref 30.0–36.0)
MCV: 95.3 fL (ref 78.0–100.0)
Platelets: 178 10*3/uL (ref 150–400)
RBC: 2.53 MIL/uL — AB (ref 3.87–5.11)
RDW: 13.9 % (ref 11.5–15.5)
WBC: 12.8 10*3/uL — ABNORMAL HIGH (ref 4.0–10.5)

## 2013-11-24 LAB — BASIC METABOLIC PANEL
BUN: 32 mg/dL — ABNORMAL HIGH (ref 6–23)
CO2: 16 meq/L — AB (ref 19–32)
Calcium: 8.1 mg/dL — ABNORMAL LOW (ref 8.4–10.5)
Chloride: 101 mEq/L (ref 96–112)
Creatinine, Ser: 2.16 mg/dL — ABNORMAL HIGH (ref 0.50–1.10)
GFR calc Af Amer: 28 mL/min — ABNORMAL LOW (ref >90)
GFR calc non Af Amer: 24 mL/min — ABNORMAL LOW (ref >90)
GLUCOSE: 197 mg/dL — AB (ref 70–99)
Potassium: 5 mEq/L (ref 3.7–5.3)
Sodium: 131 mEq/L — ABNORMAL LOW (ref 137–147)

## 2013-11-24 LAB — HEPARIN LEVEL (UNFRACTIONATED): Heparin Unfractionated: 0.16 IU/mL — ABNORMAL LOW (ref 0.30–0.70)

## 2013-11-24 LAB — LACTIC ACID, PLASMA
LACTIC ACID, VENOUS: 3.4 mmol/L — AB (ref 0.5–2.2)
Lactic Acid, Venous: 5 mmol/L — ABNORMAL HIGH (ref 0.5–2.2)
Lactic Acid, Venous: 3.4 mmol/L — ABNORMAL HIGH (ref 0.5–2.2)

## 2013-11-24 LAB — TROPONIN I: Troponin I: <0.3 ng/mL (ref <0.30)

## 2013-11-24 LAB — MAGNESIUM: Magnesium: 1.7 mg/dL (ref 1.5–2.5)

## 2013-11-24 LAB — MRSA PCR SCREENING: MRSA by PCR: NEGATIVE

## 2013-11-24 MED ORDER — HYDROCORTISONE NA SUCCINATE PF 100 MG IJ SOLR
100.0000 mg | Freq: Once | INTRAMUSCULAR | Status: AC
  Administered 2013-11-24: 100 mg via INTRAVENOUS
  Filled 2013-11-24: qty 2

## 2013-11-24 MED ORDER — HYDROCORTISONE SOD SUCCINATE 100 MG IJ SOLR
50.0000 mg | Freq: Four times a day (QID) | INTRAMUSCULAR | Status: DC
  Administered 2013-11-25 (×3): 50 mg via INTRAVENOUS
  Filled 2013-11-24 (×7): qty 1

## 2013-11-24 MED ORDER — HEPARIN BOLUS VIA INFUSION
2000.0000 [IU] | Freq: Once | INTRAVENOUS | Status: AC
  Administered 2013-11-24: 2000 [IU] via INTRAVENOUS
  Filled 2013-11-24: qty 2000

## 2013-11-24 MED ORDER — HEPARIN (PORCINE) IN NACL 100-0.45 UNIT/ML-% IJ SOLN
800.0000 [IU]/h | INTRAMUSCULAR | Status: DC
  Administered 2013-11-24: 2000 [IU]/h via INTRAVENOUS
  Filled 2013-11-24: qty 250

## 2013-11-24 MED ORDER — DEXTROSE 5 % IV SOLN
1.0000 g | INTRAVENOUS | Status: DC
  Administered 2013-11-25: 1 g via INTRAVENOUS
  Filled 2013-11-24 (×2): qty 1

## 2013-11-24 MED ORDER — VANCOMYCIN HCL 500 MG IV SOLR
500.0000 mg | INTRAVENOUS | Status: DC
  Administered 2013-11-25: 500 mg via INTRAVENOUS
  Filled 2013-11-24: qty 500

## 2013-11-24 MED ORDER — VANCOMYCIN HCL 500 MG IV SOLR
500.0000 mg | INTRAVENOUS | Status: AC
  Administered 2013-11-24: 500 mg via INTRAVENOUS
  Filled 2013-11-24: qty 500

## 2013-11-24 MED ORDER — SODIUM CHLORIDE 0.9 % IV BOLUS (SEPSIS): 1000.0000 mL | Freq: Once | INTRAVENOUS | Status: DC

## 2013-11-24 MED ORDER — HEPARIN BOLUS VIA INFUSION
2000.0000 [IU] | Freq: Once | INTRAVENOUS | Status: DC
  Filled 2013-11-24: qty 2000

## 2013-11-24 MED ORDER — DEXTROSE 5 % IV SOLN
1.0000 g | INTRAVENOUS | Status: AC
  Administered 2013-11-24: 1 g via INTRAVENOUS
  Filled 2013-11-24: qty 1

## 2013-11-24 MED ORDER — LEVOFLOXACIN IN D5W 750 MG/150ML IV SOLN
750.0000 mg | INTRAVENOUS | Status: DC
  Administered 2013-11-24: 750 mg via INTRAVENOUS
  Filled 2013-11-24 (×2): qty 150

## 2013-11-24 MED ORDER — SODIUM CHLORIDE 0.9 % IV BOLUS (SEPSIS)
1000.0000 mL | Freq: Once | INTRAVENOUS | Status: AC
  Administered 2013-11-24: 1000 mL via INTRAVENOUS

## 2013-11-24 MED ORDER — HEPARIN (PORCINE) IN NACL 100-0.45 UNIT/ML-% IJ SOLN
650.0000 [IU]/h | INTRAMUSCULAR | Status: DC
  Administered 2013-11-24: 650 [IU]/h via INTRAVENOUS
  Filled 2013-11-24 (×2): qty 250

## 2013-11-24 NOTE — Progress Notes (Signed)
Upon shift change bp 90'X systollic.  Pt  Alert and oriented.  Pt co chest discomfort to doctors.  Troponins ordered.  Abdnormal ECG.  Septal infart indeterminant age s-tach.  Bolus 1 liter given BP 69/51 hr 115.  Doctors on floor. Rapid response on floor. Doctors say ok for ICU overflow.  Step down bed unavailable at this time. Doctor want IV antibiotics going before transfer.  Pharmacy to sent cefepime and vanc to be hung.  Chest x-ray shows pneumonia. 2 bolus NS going @ 999. V/O for 2nd IV bolus Dr Colan Neptune.

## 2013-11-24 NOTE — Progress Notes (Signed)
ANTICOAGULATION and ANTIBIOTIC CONSULT NOTE - Initial Consult  Pharmacy Consult for Heparin, Vancomycin and Cefepime Indication: pulmonary embolus (rule out); HCAP  Allergies  Allergen Reactions  . Amitriptyline Hcl Swelling    In the face.  . Doxycycline Hyclate Itching    Feels like something crawling under her skin    Patient Measurements: Height: 5\' 1"  (154.9 cm) Weight: 96 lb 3.2 oz (43.636 kg) IBW/kg (Calculated) : 47.8 Heparin Dosing Weight: 43 kg  Vital Signs: Temp: 99.6 F (37.6 C) (06/22 0523) Temp src: Oral (06/22 0523) BP: 53/38 mmHg (06/22 0635) Pulse Rate: 115 (06/22 0635)  Labs:  Recent Labs  11/22/13 1110 11/23/13 0512 11/24/13 0355 11/24/13 0839  HGB 9.8* 7.8* 8.1*  --   HCT 28.7* 22.9* 24.1*  --   PLT 225 191 178  --   CREATININE 2.47* 2.13* 2.16*  --   TROPONINI  --   --   --  <0.30    Estimated Creatinine Clearance: 20 ml/min (by C-G formula based on Cr of 2.16).   Medical History: Past Medical History  Diagnosis Date  . Anemia, B12 deficiency   . History of acute pancreatitis   . Right knee pain     No recent imaging on chart  . 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 Dr. Mancel Bale, [facility]  . Hypertriglyceridemia   . GERD (gastroesophageal reflux disease)   . Vitamin D deficiency   . Subdural hematoma 02/2008    Likely 2/2 trauma from seizure from EtOH withdrawal, chronic in nature, sees Dr. Jerene Bears. Most recent CT head 10/2009 showing stable but persistent hematoma without mass effect.  . History of seizure disorder     Likely 2/2 alcohol abuse  . Hypocalcemia   . Hypomagnesemia   . Failure to thrive in childhood     Unclear etiology  . HTN (hypertension)   . Thrombocytopenia   . Anemia, macrocytic   . Hepatomegaly     On exam  . Joint pain   . Alcohol abuse   . Vitamin D deficiency   . Menopause   . Pancreatitis   . Insomnia   . Hyperlipidemia   .  Sinusitis   . Pernicious anemia   . Macrocytic anemia   . 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 Depression   . Fx humeral neck 04/17/2011    Transverse fracture- minimally displaced- managed as outpatient   . ABNORMAL PAP SMEAR, LGSIL 07/23/2008    Annotation: HPV positive CIN I Dr. Mancel Bale, [facility] Qualifier: Diagnosis of  By: Oretha Ellis    . Pneumonia 05/20/2012  . Seizures     "last one was in 2013; don't know what kind" (08/15/2013)  . Arthritis     "shoulders" (08/15/2013)    Medications:  Scheduled:  . aspirin EC  81 mg Oral QPM  . feeding supplement (ENSURE COMPLETE)  237 mL Oral TID BM  . ferrous sulfate  325 mg Oral BID WC  . FLUoxetine  10 mg Oral Daily  . folic acid  1 mg Oral Daily  . gabapentin  600 mg Oral TID  . lipase/protease/amylase  1 capsule Oral TID AC  . magnesium oxide  400 mg Oral QID  . pantoprazole  40 mg Oral Daily  . potassium chloride SA  20 mEq Oral Daily  . sodium bicarbonate  650 mg Oral BID  . sodium chloride  1,000 mL Intravenous Once  . thiamine  100 mg Oral Daily  . vancomycin  500 mg Intravenous STAT  . vitamin B-12  250 mcg Oral QPM   Infusions:    Assessment: 59 yo F presented to ED 6/20 with 5-6d hx of cough.  Pt reports cough so severe resulting in nausea/vomiting that she has been unable to eat much or take her medications.  Pt has a hx of EtOH use/abuse, chronic thrombocytopenia, and stage III CKD.  Pt was in no apparent distress until early this AM when pt became hypotensive, SOB, and complained of CP.  CXR consistent with PNA.  Pharmacy asked to start Vanc and Cefepime for HCAP and Heparin for r/o PE.  Of note, MD ordered starting abx doses of Cefepime 1gm (given at 0840) and Vanc 500mg  (not given due to loss of IV access).  IV team has been called to replace IV access.  Pt also has order for transfer to SDU level of care.  Goal of Therapy:  Heparin level 0.3-0.7 units/ml Monitor platelets by  anticoagulation protocol: Yes Vancomycin trough 15-20 mcg/ml Renal dose adjustment of antibiotics   Plan:  Vancomycin 500 mg IV q24h. Cefepime 1gm IV q24h (next dose 6/23 at 0800). Follow up cx data, renal function, and clinical progress. Heparin 2000 units IV bolus x 1.  Heparin infusion at 650 units/hr. Heparin level 8 hours after infusion started. (Will need to follow-up start time after IV placed.) Heparin level and CBC daily while on heparin. Follow-up plans for d-dimer, VQ scan to r/o PE.  [employer], Pharm.D., BCPS Clinical Pharmacist Pager [phone] 11/24/2013 10:14 AM

## 2013-11-24 NOTE — Progress Notes (Signed)
Asked by [id number] "RN" for Oxygen s/u,(meter,n/c), "not achieving good oxygen saturations", upon my arrival 59-62% on room air, no Oxygen meter in wall, multiple pulse oximeters placed R&L hand on index finger & thumbs, once O2 meter obtained then placed pt. on 6 lpm n/c, pt. Is alert and able to talk full sentences, medical staff @ bedside asked them  to place ABG order, t.o.,  ABG room air ordered, done on 6 lpm n/c, due to low sats, b/l b.s. Congested/Rhonchi, per pt. "have had PNA before without knowing and have been on 4 lpm n/c in the past but doctor took me off of it" , critical results called to me from RT, passed on to Rapid Response RN@ pts. Bedside, RT to monitor.

## 2013-11-24 NOTE — Progress Notes (Signed)
VASCULAR LAB PRELIMINARY  PRELIMINARY  PRELIMINARY  PRELIMINARY  Bilateral lower extremity venous Dopplers completed.    Preliminary report:  There is no DVT or SVT noted in the bilateral lower extremities.   Josepha Barbier, RVT 11/24/2013, 3:07 PM

## 2013-11-24 NOTE — Progress Notes (Addendum)
Subjective: Patient seen at bedside this AM. Patient hypotensive this morning, 60's/40's and tachycardic to 120's. Patient described chest tightness and SOB and feeling generally unwell. Given 2L bolus w/ mild improvement in BP, however, patient had decreased SpO2 to 80's. Started on 6L O2 via [place] 2L improvement in SpO2. CXR suggestive of pulmonary edema, lactic acid 3.4. Given Dose of Vancomycin IV, Cefepime IV and started on Heparin gtt for concern of PE.   Objective: Vital signs in last 24 hours: Filed Vitals:   11/24/13 0935 11/24/13 1106 11/24/13 1130 11/24/13 1132  BP: 75/46   91/66  Pulse: 121 107  105  Temp: 100 F (37.8 C)   99.3 F (37.4 C)  TempSrc: Oral   Oral  Resp:    24  Height:   5\' 1"  (1.549 m)   Weight:   102 lb 8.2 oz (46.5 kg)   SpO2: 93% 90%  90%   Weight change: 10 lb (4.536 kg)  Intake/Output Summary (Last 24 hours) at 11/24/13 1308 Last data filed at 11/24/13 1200  Gross per 24 hour  Intake   1889 ml  Output      0 ml  Net   1889 ml   Physical Exam: General: Alert, cooperative, NAD. HEENT: PERRL, EOMI. Moist mucus membranes Neck: Full range of motion without pain, supple, no lymphadenopathy or carotid bruits. Venous distension.  Lungs: Bibasilar rales, w/ decreased air entry in the RLL. No wheezes.  Heart: Tachycardia, regular rhythm, no murmurs, gallops, or rubs Abdomen: Soft, non-tender, non-distended, BS + Extremities: No cyanosis or edema. Clubbing present. Neurologic: Alert & oriented X3, cranial nerves II-XII intact, strength grossly intact, sensation intact to light touch   Lab Results: Basic Metabolic Panel:  Recent Labs Lab 11/22/13 1450 11/23/13 0512 11/24/13 0355  NA  --  137 131*  K  --  4.1 5.0  CL  --  104 101  CO2  --  15* 16*  GLUCOSE  --  112* 197*  BUN  --  29* 32*  CREATININE  --  2.13* 2.16*  CALCIUM  --  8.1* 8.1*  MG 1.1* 1.5 1.7  PHOS 4.5  --   --    Liver Function Tests:  Recent Labs Lab 11/22/13 1450  11/23/13 0512  AST 37 24  ALT 13 9  ALKPHOS 287* 231*  BILITOT 0.3 <0.2*  PROT 9.1* 7.5  ALBUMIN 2.8* 2.2*   CBC:  Recent Labs Lab 11/22/13 1110 11/23/13 0512 11/24/13 0355  WBC 10.7* 10.7* 12.8*  NEUTROABS 5.1  --   --   HGB 9.8* 7.8* 8.1*  HCT 28.7* 22.9* 24.1*  MCV 94.1 94.6 95.3  PLT 225 191 178   Urine Drug Screen: Drugs of Abuse     Component Value Date/Time   LABOPIA POSITIVE* 11/22/2013 1406   LABOPIA NEG 09/18/2011 0936   COCAINSCRNUR NONE DETECTED 11/22/2013 1406   COCAINSCRNUR NEG 09/18/2011 0936   LABBENZ NONE DETECTED 11/22/2013 1406   LABBENZ NEG 09/18/2011 0936   LABBENZ NEG 04/10/2011 1130   AMPHETMU NONE DETECTED 11/22/2013 1406   AMPHETMU NEG 04/10/2011 1130   THCU NONE DETECTED 11/22/2013 1406   LABBARB NONE DETECTED 11/22/2013 1406   LABBARB NEG 09/18/2011 0936    Urinalysis:  Recent Labs Lab 11/22/13 1406  COLORURINE YELLOW  LABSPEC 1.015  PHURINE 5.5  GLUCOSEU NEGATIVE  HGBUR NEGATIVE  BILIRUBINUR NEGATIVE  KETONESUR NEGATIVE  PROTEINUR 100*  UROBILINOGEN 0.2  NITRITE NEGATIVE  LEUKOCYTESUR MODERATE*   Studies/Results:  Dg Chest Port 1 View  11/24/2013   CLINICAL DATA:  Chest pain, shortness of breath.  EXAM: PORTABLE CHEST - 1 VIEW  COMPARISON:  November 22, 2013.  FINDINGS: Mild cardiomegaly is noted. Bilateral perihilar and basilar opacities are noted most consistent with pulmonary edema with mild associated pleural effusions. No pneumothorax is noted. Bony thorax is intact.  IMPRESSION: Bilateral perihilar and basilar opacities are noted most consistent with pulmonary edema. Mild bilateral pleural effusions are noted.   Electronically Signed   By: Sabino Dick M.D.   On: 11/24/2013 08:29   Medications: I have reviewed the patient's current medications. Scheduled Meds: . aspirin EC  81 mg Oral QPM  . [START ON 11/25/2013] ceFEPime (MAXIPIME) IV  1 g Intravenous Q24H  . feeding supplement (ENSURE COMPLETE)  237 mL Oral TID BM  . ferrous sulfate   325 mg Oral BID WC  . FLUoxetine  10 mg Oral Daily  . folic acid  1 mg Oral Daily  . gabapentin  600 mg Oral TID  . lipase/protease/amylase  1 capsule Oral TID AC  . magnesium oxide  400 mg Oral QID  . pantoprazole  40 mg Oral Daily  . potassium chloride SA  20 mEq Oral Daily  . sodium bicarbonate  650 mg Oral BID  . sodium chloride  1,000 mL Intravenous Once  . thiamine  100 mg Oral Daily  . [START ON 11/25/2013] vancomycin  500 mg Intravenous Q24H  . vitamin B-12  250 mcg Oral QPM   Continuous Infusions: . heparin 650 Units/hr (11/24/13 1130)   PRN Meds:.acetaminophen, guaiFENesin-codeine, promethazine  Assessment/Plan: [name] is a 58 y.o. 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   Acute Hypoxic Respiratory Failure- Patient admitted w/ acute bronchitis, however, this AM, patient hypotensive, 60's/40's and tachycardic to 120's. Patient described chest tightness and SOB and feeling generally unwell. Given 2L bolus w/ mild improvement in BP, however, patient had decreased SpO2 to 80's. Started on 6L O2 via [place] 2L improvement in SpO2. CXR suggestive of pulmonary edema, lactic acid 3.4. Leukocytosis of 12.7 this AM. Given dose of Vancomycin IV, Cefepime IV and started on Heparin gtt for concern of PE. On exam, patient w/ bilateral rales but decreased air entry on the RLL. Also some concern for volume overload in the setting of recent worsening of renal function. Previous ECHO from 12/2012 shows EF 60-65% w/ no WMA, no diastolic dysfunction. Troponin negative x1. EKG w/ no dynamic changes. ABG on 6L O2 w/ pH 7.28, PCO2 29, pO2 60, HCO3 13.3, SpO2 88%. -Vancomycin + Cefepime IV -Heparin gtt -ECHO pending -PCCM consulted; recs pending  Hypotension- Low BP starting this AM, to  50's/40's. Questionably related to sepsis.  -Solu-Cortef 100 mg IV for now; evaluate for BP response -Hold fluids for now  Acute on CKD- Patient w/ baseline CKD stage III, had a renal cortical biopsy in 10/2013 which was suggestive of FSGS. No improvement in renal function this AM. Trend as follows:   Recent Labs Lab 11/22/13 1110 11/23/13 0512 11/24/13 0355  CREATININE 2.47* 2.13* 2.16*  -Hold fluids -Continue to replete electrolytes as necessary -Continue NaHCO3 po 650 mg bid  AG Metabolic Acidosis- Patient w/ chronic anion gap acidosis in the setting of worsening renal function, baseline HCO3 ~16-18. On admission, HCO3 13  w/ AG of 20 on admission. This AM, HCO3 16, AG 14. -Continue NaHCO3 650 mg po bid -Repeat BMP in AM -Patient will need renal follow up on discharge.  Acute Viral Bronchitis- Still w/ significant cough, question pneumonia at this time. See above. -Guafenesin-codeine 5 mL q4h prn for cough  -Phenergan prn for vomiting   Alcohol Abuse- Claims she has been abstinent for 3-4 months. -CIWA protocol  -Replete electrolytes as discussed below  -Continue home vitamins (thiamine, B12)  -Continue Creon   Hypokalemia/hypomagnesemia- Resolved.  -Replete as necessary  -Repeat BMP and Mag in AM  -Continue home Magnesium Oxide 400 mg qid   Anemia of Chronic Disease- Baseline Hb ~9.0. Found to be 8.1 this morning.  -Continue to monitor  -Continue home ferrous sulfate 325 mg bid  -CBC in AM   HTN- Hypotensive as above -Hold home Norvasc  Dispo: Disposition is deferred at this time, awaiting improvement of current medical problems.  Anticipated discharge in approximately 1-2 day(s).   The patient does have a current PCP Otho Bellows, MD) and does need an [facility] hospital follow-up appointment after discharge.  The patient does not have transportation limitations that hinder transportation to clinic appointments.  .Services Needed at time of discharge: Y = Yes, Blank  = No PT:   OT:   RN:   Equipment:   Other:     LOS: 2 days   Corky Sox, MD 11/24/2013, 1:08 PM

## 2013-11-24 NOTE — Progress Notes (Signed)
ANTICOAGULATION CONSULT NOTE  Pharmacy Consult for Heparin Indication: pulmonary embolus (rule out)  Allergies  Allergen Reactions  . Amitriptyline Hcl Swelling    In the face.  . Doxycycline Hyclate Itching    Feels like something crawling under her skin    Patient Measurements: Height: 5\' 1"  (154.9 cm) Weight: 102 lb 8.2 oz (46.5 kg) IBW/kg (Calculated) : 47.8 Heparin Dosing Weight: 43 kg  Vital Signs: Temp: 98.2 F (36.8 C) (06/22 2058) Temp src: Oral (06/22 2058) BP: 94/55 mmHg (06/22 1800) Pulse Rate: 104 (06/22 1800)  Labs:  Recent Labs  11/22/13 1110 11/23/13 0512 11/24/13 0355 11/24/13 0839 11/24/13 1418 11/24/13 2058  HGB 9.8* 7.8* 8.1*  --   --   --   HCT 28.7* 22.9* 24.1*  --   --   --   PLT 225 191 178  --   --   --   HEPARINUNFRC  --   --   --   --   --  0.16*  CREATININE 2.47* 2.13* 2.16*  --   --   --   TROPONINI  --   --   --  <0.30 <0.30  --     Estimated Creatinine Clearance: 21.3 ml/min (by C-G formula based on Cr of 2.16).  Medications:  Heparin @ 650 units/hr  Assessment: 58 yo F presented to ED 6/20 with 5-6d hx of cough.  Pt reports cough so severe resulting in nausea/vomiting that she has been unable to eat much or take her medications. Pharmacy consulted to start antibiotics for PNA as well as heparin for r/o PE. Initial heparin level this evening was low at 0.16 units/mL. No issues with line after IV was placed, drip was not stopped. No bleeding noted.   Goal of Therapy:  Heparin level 0.3-0.7 units/ml Monitor platelets by anticoagulation protocol: Yes    Plan:  1. Rebolus with heparin 2000 units IV x1 2. Increase heparin drip rate to 800 units/hr (~3units/kg/hr increase) 3. Heparin level in 6 hours 4. Daily HL and CBC 5. Follow for s/s bleeding, VQ scan results  Lauren D. Bajbus, PharmD, BCPS Clinical Pharmacist Pager: [phone] 11/24/2013 9:43 PM

## 2013-11-24 NOTE — Progress Notes (Signed)
INITIAL NUTRITION ASSESSMENT  DOCUMENTATION CODES Per approved criteria  -Severe malnutrition in the context of chronic illness -Underweight   INTERVENTION: Continue Ensure Complete PO TID. Agree with Regular, liberalized diet. RD to continue to follow nutrition care plan.  NUTRITION DIAGNOSIS: Malnutrition related to chronic medical issues as evidenced by inability to gain weight and severe fat/muscle wasting.  Goal: Intake to meet >90% of estimated nutrition needs.  Monitor:  weight trends, lab trends, I/O's, PO intake, supplement tolerance  Reason for Assessment: Malnutrition Screening Tool  58 y.o. 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 Admitted with cough x 5-6 days, vomiting. Work-up reveals acute on CKD.  Pt currently being transferred to SDU. Currently ordered for Regular diet. Consumed 100% of her meals yesterday but only 25% this morning. Pt with blood pressure issues and need higher level of care, per RN.  RD familiar with patient from March 2015 hospitalization for low magnesium.  Nutrition Focused Physical Exam:   Subcutaneous Fat:  Orbital Region: severe depletion  Upper Arm Region: severe depletion  Thoracic and Lumbar Region: severe depletion   Muscle:  Temple Region: severe depletion  Clavicle Bone Region: severe depletion  Clavicle and Acromion Bone Region: severe depletion  Scapular Bone Region: severe depletion  Dorsal Hand: severe depletion  Patellar Region: severe depletion  Anterior Thigh Region: severe depletion  Posterior Calf Region: severe depletion   Edema: none   Pt meets criteria for severe MALNUTRITION in the context of social/environmental circumstances as evidenced by severe fat and muscle mass loss, intake of <50% x  at least 3 months.  Sodium low at 131 Potassium WNL Magnesium WNL Phosphorus WNL  Height: Ht Readings from Last 1 Encounters:  11/23/13 5\' 1"  (1.549 m)    Weight: Wt Readings from Last 1 Encounters:  11/23/13 96 lb 3.2 oz (43.636 kg)    Ideal Body Weight: 105 lb  % Ideal Body Weight: 91%  Wt Readings from Last 10 Encounters:  11/23/13 96 lb 3.2 oz (43.636 kg)  10/29/13 91 lb (41.277 kg)  09/18/13 91 lb 1.6 oz (41.323 kg)  08/27/13 92 lb (41.731 kg)  08/22/13 89 lb (40.37 kg)  08/22/13 89 lb (40.37 kg)  08/07/13 82 lb 11.2 oz (37.512 kg)  06/10/13 83 lb 1.6 oz (37.694 kg)  06/02/13 83 lb (37.649 kg)  05/21/13 83 lb 4.8 oz (37.785 kg)    Usual Body Weight: 83 lb  % Usual Body Weight: 116%  BMI:  Body mass index is 18.19 kg/(m^2). Underweight  Estimated Nutritional Needs: Kcal: 1600 - 1800 Protein: 55 - 70 g Fluid: 1.8 - 2 liters  Skin: intact  Diet Order: General  EDUCATION NEEDS: -Education not appropriate at this time   Intake/Output Summary (Last 24 hours) at 11/24/13 1042 Last data filed at 11/24/13 0900  Gross per 24 hour  Intake 2122.5 ml  Output      0 ml  Net 2122.5 ml    Last BM: 6/21  Labs:   Recent Labs Lab 11/22/13 1110 11/22/13 1450 11/23/13 0512 11/24/13 0355  NA 135*  --  137 131*  K 3.4*  --  4.1 5.0  CL 102  --  104 101  CO2 13*  --  15* 16*  BUN 29*  --  29* 32*  CREATININE 2.47*  --  2.13* 2.16*  CALCIUM 8.2*  --  8.1* 8.1*  MG  --  1.1* 1.5 1.7  PHOS  --  4.5  --   --   GLUCOSE 106*  --  112* 197*    CBG (last 3)  No results found for this basename: GLUCAP,  in the last 72 hours  Scheduled Meds: . aspirin EC  81 mg Oral QPM  . [START ON 11/25/2013] ceFEPime (MAXIPIME) IV  1 g Intravenous Q24H  . feeding supplement (ENSURE COMPLETE)  237 mL Oral TID BM  . ferrous sulfate  325 mg Oral BID WC  . FLUoxetine  10 mg Oral Daily  . folic acid  1 mg Oral Daily  . gabapentin  600 mg Oral TID  . heparin  2,000 Units  Intravenous Once  . lipase/protease/amylase  1 capsule Oral TID AC  . magnesium oxide  400 mg Oral QID  . pantoprazole  40 mg Oral Daily  . potassium chloride SA  20 mEq Oral Daily  . sodium bicarbonate  650 mg Oral BID  . sodium chloride  1,000 mL Intravenous Once  . thiamine  100 mg Oral Daily  . vancomycin  500 mg Intravenous STAT  . [START ON 11/25/2013] vancomycin  500 mg Intravenous Q24H  . vitamin B-12  250 mcg Oral QPM    Continuous Infusions: . heparin      Past Medical History  Diagnosis Date  . Anemia, B12 deficiency   . History of acute pancreatitis   . Right knee pain     No recent imaging on chart  . 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 Dr. Mancel Bale, [facility]  . Hypertriglyceridemia   . GERD (gastroesophageal reflux disease)   . Vitamin D deficiency   . Subdural hematoma 02/2008    Likely 2/2 trauma from seizure from EtOH withdrawal, chronic in nature, sees Dr. Jerene Bears. Most recent CT head 10/2009 showing stable but persistent hematoma without mass effect.  . History of seizure disorder     Likely 2/2 alcohol abuse  . Hypocalcemia   . Hypomagnesemia   . Failure to thrive in childhood     Unclear etiology  . HTN (hypertension)   . Thrombocytopenia   . Anemia, macrocytic   . Hepatomegaly     On exam  . Joint pain   . Alcohol abuse   . Vitamin D deficiency   . Menopause   . Pancreatitis   . Insomnia   . Hyperlipidemia   . Sinusitis   . Pernicious anemia   . Macrocytic anemia   . 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 Depression   . Fx humeral neck 04/17/2011    Transverse fracture- minimally displaced- managed as outpatient   . ABNORMAL PAP SMEAR, LGSIL 07/23/2008    Annotation: HPV positive CIN I Dr. Mancel Bale, [facility] Qualifier: Diagnosis of  By: Oretha Ellis    . Pneumonia 05/20/2012  . Seizures     "last one was in 2013; don't know what kind"  (08/15/2013)  . Arthritis     "shoulders" (08/15/2013)    Past Surgical History  Procedure Laterality Date  . Cesarean section  1983  . Esophagogastroduodenoscopy  07/11/2011    Procedure: ESOPHAGOGASTRODUODENOSCOPY (EGD);  Surgeon: Beryle Beams, MD;  Location: [facility];  Service: Endoscopy;  Laterality: N/A;  . Colonoscopy  07/11/2011    Procedure: COLONOSCOPY;  Surgeon: Tory Emerald  Benson Norway, MD;  Location: [facility]OS[facility];  Service: Endoscopy;  Laterality: N/A;  . Eye surgery Left     "trauma"  . Right colectomy  08/28/2011  . Esophagogastroduodenoscopy N/A 12/01/2012    Procedure: ESOPHAGOGASTRODUODENOSCOPY (EGD);  Surgeon: Irene Shipper, MD;  Location: [facility];  Service: Endoscopy;  Laterality: N/A;  . Colonoscopy with esophagogastroduodenoscopy (egd) Left 08/21/2013    Procedure: COLONOSCOPY WITH ESOPHAGOGASTRODUODENOSCOPY (EGD);  Surgeon: Beryle Beams, MD;  Location: [facility] ENDOSCOPY;  Service: Endoscopy;  Laterality: Left;    Inda Coke MS, RD, LDN Inpatient Registered Dietitian Pager: [phone] After-hours pager: [phone]

## 2013-11-24 NOTE — Progress Notes (Signed)
NF Interim Progress Note  Patient hypoxic and hypotensive today and started on IV antibiotics, stress dose steroids and heparin gtt (concern for infection/adrenal insufficiency vs PE).  Went to check on patient at the request of the day team.  She is doing well, denies dizziness or dyspnea on 6L.  She reports some chest wall pain 2/2 to cough.  She has been getting up with assistance to use bedside commode.  She feels improved from this AM.  Her appetite is good and there are partially eaten snacks/fruit at bedside.    Recent vitals:  HR 93, RR 23, BP 94/42mmHg, MAP 69, SpO2 90s on 6L, T 98.67F General:  In bed watching tv in NAD, in good spirits, cooperative with exam Heart:  RRR, no m/r/g Lungs:  B/L rales, able to speak in full sentences, no accessory muscle use Abdomen:  +BS, soft, NT, ND LE:  No edema, nontender Neuro:  AAO x 4, responding appropriately  58 year old woman with PMH of HTN, EtOH abuse, chronic thrombocytopenia, CKD3 2/2 FSGS, hypomagnesemia, chronic AG metabolic acidosis admitted with acute on chronic renal failure and AG metabolic acidosis.  She became developed acute hypoxic respiratory failure and hypotension today.  CXR c/w pulmonary edema and rales on exam.  No EKG changes.  CE negative x 3 (most recently tonight at 9PM).  WBC increased to 12.8 this AM.   Lactic acid 3.4-->5.0.  She seems to be doing better this evening.  She is not dyspneic and is making conversation.  No signs of respiratory distress.   - continue oxygen supplementation - continuous monitoring in SDU - continue IV antibiotics - continue heparin gtt - continue stress dose steroids, pending Cortisol result  Duwaine Maxin DO IMTS PGY1 Night float intern

## 2013-11-24 NOTE — Progress Notes (Signed)
  Echocardiogram 2D Echocardiogram has been performed.  Diamond Nickel 11/24/2013, 1:49 PM

## 2013-11-24 NOTE — Progress Notes (Signed)
[name] infiltrated.  Paged IV team.  Bp 78/56 hr 121 O2 sat 92% on 4 lpm via [place]. Pt to be transferred to higher level of care. Pt also coughing up blood, doctor aware.

## 2013-11-24 NOTE — Consult Note (Signed)
PULMONARY / CRITICAL CARE MEDICINE  Name: [name] [name] MRN: [id number] DOB: Oct 17, 1955    ADMISSION DATE:  11/22/2013 CONSULTATION DATE:  11/24/2013  REFERRING MD :  Graciella Freer PRIMARY SERVICE:  PCCM  CHIEF COMPLAINT:  Hypotension  BRIEF PATIENT DESCRIPTION: 58 yo admitted 6/20 with acute on chronic renal failure, worsening metabolic acidosis and suspected bronchitis.  On 6/22 was noted to be hypotensive.  Developed increased oxygen requirements aftre receiving several fluid boluses.  SIGNIFICANT EVENTS / STUDIES:  6/22  TTE >>> 6/22  Venous Doppler LE >>>  LINES / TUBES:  CULTURES: 6/22 Blood >>>  ANTIBIOTICS: Zosyn 6/22 >>> Vancomycin 6/22 >>> Levaquin 6/22 >>>  HISTORY OF PRESENT ILLNESS:  58 yo admitted 6/20 with acute on chronic renal failure, worsening metabolic acidosis and suspected bronchitis.  On 6/22 was noted to be hypotensive.  Developed increased oxygen requirements aftre receiving several fluid boluses.  Denies any fever, chills. Reports cough productive of clear white phlegm.  Denies GI or GU symptoms.   PAST MEDICAL HISTORY :  Past Medical History  Diagnosis Date  . Anemia, B12 deficiency   . History of acute pancreatitis   . Right knee pain     No recent imaging on chart  . 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 Dr. Mancel Bale, [facility]  . Hypertriglyceridemia   . GERD (gastroesophageal reflux disease)   . Vitamin D deficiency   . Subdural hematoma 02/2008    Likely 2/2 trauma from seizure from EtOH withdrawal, chronic in nature, sees Dr. Jerene Bears. Most recent CT head 10/2009 showing stable but persistent hematoma without mass effect.  . History of seizure disorder     Likely 2/2 alcohol abuse  . Hypocalcemia   . Hypomagnesemia   . Failure to thrive in childhood     Unclear etiology  . HTN (hypertension)   . Thrombocytopenia   . Anemia, macrocytic   . Hepatomegaly     On exam  .  Joint pain   . Alcohol abuse   . Vitamin D deficiency   . Menopause   . Pancreatitis   . Insomnia   . Hyperlipidemia   . Sinusitis   . Pernicious anemia   . Macrocytic anemia   . 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 Depression   . Fx humeral neck 04/17/2011    Transverse fracture- minimally displaced- managed as outpatient   . ABNORMAL PAP SMEAR, LGSIL 07/23/2008    Annotation: HPV positive CIN I Dr. Mancel Bale, [facility] Qualifier: Diagnosis of  By: Oretha Ellis    . Pneumonia 05/20/2012  . Seizures     "last one was in 2013; don't know what kind" (08/15/2013)  . Arthritis     "shoulders" (08/15/2013)   Past Surgical History  Procedure Laterality Date  . Cesarean section  1983  . Esophagogastroduodenoscopy  07/11/2011    Procedure: ESOPHAGOGASTRODUODENOSCOPY (EGD);  Surgeon: Beryle Beams, MD;  Location: [facility];  Service: Endoscopy;  Laterality: N/A;  . Colonoscopy  07/11/2011    Procedure: COLONOSCOPY;  Surgeon: Beryle Beams, MD;  Location: [facility] ENDOSCOPY;  Service: Endoscopy;  Laterality: N/A;  . Eye surgery Left     "trauma"  . Right colectomy  08/28/2011  . Esophagogastroduodenoscopy N/A 12/01/2012    Procedure: ESOPHAGOGASTRODUODENOSCOPY (EGD);  Surgeon: Irene Shipper, MD;  Location: [facility];  Service: Endoscopy;  Laterality: N/A;  . Colonoscopy with  esophagogastroduodenoscopy (egd) Left 08/21/2013    Procedure: COLONOSCOPY WITH ESOPHAGOGASTRODUODENOSCOPY (EGD);  Surgeon: Beryle Beams, MD;  Location: [facility] ENDOSCOPY;  Service: Endoscopy;  Laterality: Left;   Prior to Admission medications   Medication Sig Start Date End Date Taking? Authorizing Provider  amLODipine (NORVASC) 10 MG tablet Take 10 mg by mouth daily.  09/15/13  Yes Historical Provider, MD  aspirin EC 81 MG tablet Take 1 tablet (81 mg total) by mouth every evening. 01/24/13  Yes Otho Bellows, MD  calcium carbonate (TUMS) 500 MG chewable tablet Chew 6 tablets (1,200 mg of elemental  calcium total) by mouth 3 (three) times daily. For bone health 11/11/12  Yes Encarnacion Slates, NP  cetirizine (ZYRTEC) 10 MG tablet Take 1 tablet (10 mg total) by mouth daily. 01/30/13  Yes Otho Bellows, MD  diclofenac sodium (VOLTAREN) 1 % GEL Apply 2 g topically 2 (two) times daily. To affected area 08/27/13  Yes Jeralene Huff, MD  ferrous sulfate 325 (65 FE) MG tablet Take 1 tablet (325 mg total) by mouth 2 (two) times daily with a meal. 01/24/13  Yes Otho Bellows, MD  FLUoxetine (PROZAC) 10 MG capsule Take 1 capsule (10 mg total) by mouth daily. For depression 02/11/13  Yes Otho Bellows, MD  folic acid (FOLVITE) 1 MG tablet Take 1 tablet (1 mg total) by mouth daily. For folic acid replacement 11/06/13  Yes Sid Falcon, MD  gabapentin (NEURONTIN) 300 MG capsule Take 2 capsules (600 mg total) by mouth 3 (three) times daily. For anxiety/pain control 07/02/13  Yes Otho Bellows, MD  lipase/protease/amylase (CREON-12/PANCREASE) 12000 UNITS CPEP capsule Take 1 capsule by mouth 3 (three) times daily before meals. 04/10/13  Yes Otho Bellows, MD  magnesium oxide (MAG-OX) 400 MG tablet Take 1 tablet (400 mg total) by mouth 4 (four) times daily. 09/18/13  Yes Otho Bellows, MD  meclizine (ANTIVERT) 50 MG tablet Take 1 tablet (50 mg total) by mouth 3 (three) times daily as needed for dizziness. 01/15/13  Yes Clayton Bibles, PA-C  Multiple Vitamin (MULTIVITAMIN WITH MINERALS) TABS tablet Take 1 tablet by mouth daily. For vitamin replacement 04/10/13  Yes Otho Bellows, MD  omeprazole (PRILOSEC) 40 MG capsule Take 1 capsule (40 mg total) by mouth daily. 02/11/13  Yes Otho Bellows, MD  potassium chloride SA (KLOR-CON M20) 20 MEQ tablet Take 1 tablet (20 mEq total) by mouth daily. For low potassium 01/01/13  Yes Otho Bellows, MD  thiamine (VITAMIN B-1) 100 MG tablet Take 1 tablet (100 mg total) by mouth daily. For low thiamine   Yes Otho Bellows, MD  vitamin B-12 (CYANOCOBALAMIN) 250 MCG tablet  Take 1 tablet (250 mcg total) by mouth every evening. 04/10/13  Yes Otho Bellows, MD  Vitamin D, Ergocalciferol, (DRISDOL) 50000 UNITS CAPS capsule Take 1 capsule (50,000 Units total) by mouth every 7 (seven) days. Thursdays: For bone health 08/08/13  Yes Otho Bellows, MD  feeding supplement, ENSURE COMPLETE, (ENSURE COMPLETE) LIQD Take 237 mLs by mouth 3 (three) times daily between meals. 08/27/13   Jeralene Huff, MD   Allergies  Allergen Reactions  . Amitriptyline Hcl Swelling    In the face.  . Doxycycline Hyclate Itching    Feels like something crawling under her skin   FAMILY HISTORY:  Family History  Problem Relation Age of Onset  . Cancer Mother     Died from stomach cancer and "flesh eating rash  .  Heart failure Father     Died in 29s from an MI  . Alcohol abuse Sister     Twin sister drinks a lot, as did both her parents and brothers  . Stroke Brother     Has 7 brothers, 1 with CVA   SOCIAL HISTORY:  reports that she quit smoking about 3 years ago. Her smoking use included Cigarettes. She has a 20 pack-year smoking history. She has never used smokeless tobacco. She reports that she drinks alcohol. She reports that she uses illicit drugs (Marijuana and Cocaine).  REVIEW OF SYSTEMS:  As above  INTERVAL HISTORY:  VITAL SIGNS: Temp:  [98.7 F (37.1 C)-100 F (37.8 C)] 99.3 F (37.4 C) (06/22 1132) Pulse Rate:  [69-121] 105 (06/22 1132) Resp:  [18-24] 24 (06/22 1132) BP: (53-112)/(38-78) 91/66 mmHg (06/22 1132) SpO2:  [87 %-99 %] 90 % (06/22 1132) Weight:  [43.636 kg (96 lb 3.2 oz)-46.5 kg (102 lb 8.2 oz)] 46.5 kg (102 lb 8.2 oz) (06/22 1130)  HEMODYNAMICS:   VENTILATOR SETTINGS:   INTAKE / OUTPUT: Intake/Output     06/21 0701 - 06/22 0700 06/22 0701 - 06/23 0700   P.O. 480 120   I.V. (mL/kg) 1762.5 (40.4) 6.5 (0.1)   Total Intake(mL/kg) 2242.5 (51.4) 126.5 (2.7)   Urine (mL/kg/hr) 800 (0.8)    Total Output 800     Net +1442.5 +126.5        Urine  Occurrence  1 x   Stool Occurrence  1 x     PHYSICAL EXAMINATION: General:  Resting in no distress Neuro:  Awake, alert, non focal HEENT:  PERRL, moist membrames Cardiovascular:  Regular, tachycardic Lungs:  Bilateral air entry, scatted rhonchi / rales Abdomen:  Soft, nontender, bowel sounds normal Musculoskeletal:  Moves all extremities, no edema Skin:  Intact  LABS: CBC  Recent Labs Lab 11/22/13 1110 11/23/13 0512 11/24/13 0355  WBC 10.7* 10.7* 12.8*  HGB 9.8* 7.8* 8.1*  HCT 28.7* 22.9* 24.1*  PLT 225 191 178   Coag's No results found for this basename: APTT, INR,  in the last 168 hours BMET  Recent Labs Lab 11/22/13 1110 11/23/13 0512 11/24/13 0355  NA 135* 137 131*  K 3.4* 4.1 5.0  CL 102 104 101  CO2 13* 15* 16*  BUN 29* 29* 32*  CREATININE 2.47* 2.13* 2.16*  GLUCOSE 106* 112* 197*   Electrolytes  Recent Labs Lab 11/22/13 1110 11/22/13 1450 11/23/13 0512 11/24/13 0355  CALCIUM 8.2*  --  8.1* 8.1*  MG  --  1.1* 1.5 1.7  PHOS  --  4.5  --   --    Sepsis Markers  Recent Labs Lab 11/24/13 0839  LATICACIDVEN 3.4*   ABG  Recent Labs Lab 11/22/13 1224 11/24/13 0915  PHART 7.224* 7.285*  PCO2ART 30.8* 29.0*  PO2ART 83.0 59.7*   Liver Enzymes  Recent Labs Lab 11/22/13 1450 11/23/13 0512  AST 37 24  ALT 13 9  ALKPHOS 287* 231*  BILITOT 0.3 <0.2*  ALBUMIN 2.8* 2.2*   Cardiac Enzymes  Recent Labs Lab 11/24/13 0839  TROPONINI <0.30   Glucose No results found for this basename: GLUCAP,  in the last 168 hours  IMAGING: Dg Chest Port 1 View  11/24/2013   CLINICAL DATA:  Chest pain, shortness of breath.  EXAM: PORTABLE CHEST - 1 VIEW  COMPARISON:  November 22, 2013.  FINDINGS: Mild cardiomegaly is noted. Bilateral perihilar and basilar opacities are noted most consistent with pulmonary edema with mild  associated pleural effusions. No pneumothorax is noted. Bony thorax is intact.  IMPRESSION: Bilateral perihilar and basilar opacities are  noted most consistent with pulmonary edema. Mild bilateral pleural effusions are noted.   Electronically Signed   By: Sabino Dick M.D.   On: 11/24/2013 08:29    ASSESSMENT / PLAN:  PULMONARY A:   Acute hypoxemic respiratory failure Likely pulmonary edema Bilateral pleural effusions Possible pneumonia P:   Goal SpO2>92, pH>7.30 Full mechanical support Daily SBT Ventilator bundle Trend ABG / CXR Abx as above  CARDIOVASCULAR A:  Hypotension in setting of adrenal insufficiency ( cortisol 10 on 12/01/2012 ) Possible sepsis Possible pulmonary embolism   P:  Goal MAP>60 Trend troponin / lactate Renal function is prohibitive of chest CTA Bilateral LE venous Doppler TTE Empirical Heparin gtt May need CVL / vasopressors ASA Hold Norvasc  RENAL A:   Acute on chronic renal failure Acute on chronic metabolic acidosis ( likely combination of gap ( lactic ) and non-gap ( renal loss ) ) Clinically fluid overloaded Hyponatremia Hypomagnesemia Hyperkalemia P:   Trend BMP Hesitant to give bicarbonate IV as may contribute to fluid overload Bicarbonate PO Consider Lasix when BP stabilized  Mg oxide D/c scheduled K  GASTROINTESTINAL A:   GERD on PPI Alcoholic liver cirrhosis P:   Diet Protonix  HEMATOLOGIC A:   Chronic anemia Therapeutic anticoagulation for presumed PE P:  Trend CBC Heparin gtt  INFECTIOUS A:   Possible pneumonia P:   Cultures and antibiotics as above Levaquin added for atypical coverage  ENDOCRINE  A:   Adrenal insufficiency  ( cortisol 10 on 12/01/2012 ) At risk for hyperglycemia / hypoglycemia P:   Hydrocortisone 50 mg IV q6h SSI  NEUROLOGIC A:   Neuropathy  Depression P:   Gabapentin  Prozac  I have personally obtained history, examined patient, evaluated and interpreted laboratory and imaging results, reviewed medical records, formulated assessment / plan and placed orders.  CRITICAL CARE:  The patient is critically ill with  multiple organ systems failure and requires high complexity decision making for assessment and support, frequent evaluation and titration of therapies, application of advanced monitoring technologies and extensive interpretation of multiple databases. Critical Care Time devoted to patient care services described in this note is 40 minutes.   Doree Fudge, MD Pulmonary and [place] Pager: [phone]  11/24/2013, 1:28 PM

## 2013-11-24 NOTE — Progress Notes (Signed)
Pt Bp 78/54 hr 121, O2 sat 98% on toe 81% on finger. Placed pt on 2 lpm via [place].

## 2013-11-24 NOTE — Patient Care Conference (Signed)
Paged IV team stated hold off until transfer, pt to have heparin drip and may need a picc line per [facility] in IV team.

## 2013-11-24 NOTE — Progress Notes (Signed)
  Date: 11/24/2013  Patient name: ASERET HOFFMAN  Medical record number: [id number]  Date of birth: December 12, 1955   This patient has been seen and the plan of care was discussed with the house staff. Please see their note for complete details. I concur with their findings with the following additions/corrections:  Unclear source of her hypoxia - - pulmonary edema vs. Pneumonia vs. PE.  Currently she is being treated with oxygen, IV Abx and Heparin.  TTE was done today and PCCM was consulted.  Have stopped all fluids at this time and closely following BPs.  She has been placed on solu-cortef prior to cortisol level being drawn.  She has only one cortisol level in the system and that was an afternoon (6pm) level which is less useful.  Have ordered a repeat CXR in the AM, if BPs improve overnight, can consider a one time dose of lasix.  Consider VQ scan once stabilized and edema improved to evaluate for PE.   Sid Falcon, MD 11/24/2013, 4:08 PM

## 2013-11-24 NOTE — Progress Notes (Signed)
Called report to [place] on 2 H being transferred to 2900 room 15.

## 2013-11-24 NOTE — Progress Notes (Signed)
Pt transferred to [facility] 2900 room 15 via bed along with meds and chart.

## 2013-11-25 ENCOUNTER — Inpatient Hospital Stay (HOSPITAL_COMMUNITY): Payer: PRIVATE HEALTH INSURANCE

## 2013-11-25 DIAGNOSIS — E875 Hyperkalemia: Secondary | ICD-10-CM

## 2013-11-25 DIAGNOSIS — I5030 Unspecified diastolic (congestive) heart failure: Secondary | ICD-10-CM | POA: Diagnosis present

## 2013-11-25 LAB — BASIC METABOLIC PANEL
BUN: 37 mg/dL — AB (ref 6–23)
BUN: 39 mg/dL — ABNORMAL HIGH (ref 6–23)
BUN: 39 mg/dL — ABNORMAL HIGH (ref 6–23)
CHLORIDE: 106 meq/L (ref 96–112)
CHLORIDE: 106 meq/L (ref 96–112)
CHLORIDE: 104 meq/L (ref 96–112)
CO2: 15 meq/L — AB (ref 19–32)
CO2: 16 meq/L — AB (ref 19–32)
CO2: 17 meq/L — AB (ref 19–32)
Calcium: 8.2 mg/dL — ABNORMAL LOW (ref 8.4–10.5)
Calcium: 8.7 mg/dL (ref 8.4–10.5)
Calcium: 8.6 mg/dL (ref 8.4–10.5)
Creatinine, Ser: 2.41 mg/dL — ABNORMAL HIGH (ref 0.50–1.10)
Creatinine, Ser: 2.49 mg/dL — ABNORMAL HIGH (ref 0.50–1.10)
Creatinine, Ser: 2.66 mg/dL — ABNORMAL HIGH (ref 0.50–1.10)
GFR calc Af Amer: 25 mL/min — ABNORMAL LOW (ref >90)
GFR calc Af Amer: 24 mL/min — ABNORMAL LOW (ref >90)
GFR calc Af Amer: 22 mL/min — ABNORMAL LOW (ref >90)
GFR calc non Af Amer: 21 mL/min — ABNORMAL LOW (ref >90)
GFR calc non Af Amer: 21 mL/min — ABNORMAL LOW (ref >90)
GFR calc non Af Amer: 19 mL/min — ABNORMAL LOW (ref >90)
GLUCOSE: 283 mg/dL — AB (ref 70–99)
GLUCOSE: 294 mg/dL — AB (ref 70–99)
Glucose, Bld: 317 mg/dL — ABNORMAL HIGH (ref 70–99)
POTASSIUM: 6.2 meq/L — AB (ref 3.7–5.3)
POTASSIUM: 5.6 meq/L — AB (ref 3.7–5.3)
Potassium: 4.2 mEq/L (ref 3.7–5.3)
SODIUM: 135 meq/L — AB (ref 137–147)
SODIUM: 137 meq/L (ref 137–147)
Sodium: 134 mEq/L — ABNORMAL LOW (ref 137–147)

## 2013-11-25 LAB — PHOSPHORUS: PHOSPHORUS: 2.8 mg/dL (ref 2.3–4.6)

## 2013-11-25 LAB — GLUCOSE, CAPILLARY
GLUCOSE-CAPILLARY: 294 mg/dL — AB (ref 70–99)
GLUCOSE-CAPILLARY: 335 mg/dL — AB (ref 70–99)
Glucose-Capillary: 271 mg/dL — ABNORMAL HIGH (ref 70–99)

## 2013-11-25 LAB — CORTISOL: CORTISOL PLASMA: 79.5 ug/dL

## 2013-11-25 LAB — HEPARIN LEVEL (UNFRACTIONATED)
Heparin Unfractionated: 0.45 IU/mL (ref 0.30–0.70)
Heparin Unfractionated: 0.47 IU/mL (ref 0.30–0.70)

## 2013-11-25 LAB — LACTIC ACID, PLASMA: Lactic Acid, Venous: 2.3 mmol/L — ABNORMAL HIGH (ref 0.5–2.2)

## 2013-11-25 LAB — MAGNESIUM: Magnesium: 1.6 mg/dL (ref 1.5–2.5)

## 2013-11-25 MED ORDER — FUROSEMIDE 40 MG PO TABS
40.0000 mg | ORAL_TABLET | Freq: Two times a day (BID) | ORAL | Status: DC
  Administered 2013-11-26: 40 mg via ORAL
  Filled 2013-11-25 (×3): qty 1

## 2013-11-25 MED ORDER — HYDROCORTISONE SOD SUCCINATE 100 MG IJ SOLR
50.0000 mg | Freq: Two times a day (BID) | INTRAMUSCULAR | Status: DC
  Filled 2013-11-25: qty 1

## 2013-11-25 MED ORDER — FUROSEMIDE 10 MG/ML IJ SOLN: 20.0000 mg | Freq: Once | INTRAMUSCULAR | Status: DC

## 2013-11-25 MED ORDER — INSULIN ASPART 100 UNIT/ML IV SOLN
10.0000 [IU] | Freq: Once | INTRAVENOUS | Status: AC
  Administered 2013-11-25: 10 [IU] via INTRAVENOUS

## 2013-11-25 MED ORDER — FUROSEMIDE 10 MG/ML IJ SOLN
40.0000 mg | Freq: Once | INTRAMUSCULAR | Status: AC
  Administered 2013-11-25: 40 mg via INTRAVENOUS
  Filled 2013-11-25: qty 4

## 2013-11-25 MED ORDER — SODIUM POLYSTYRENE SULFONATE 15 GM/60ML PO SUSP
15.0000 g | Freq: Once | ORAL | Status: AC
  Administered 2013-11-25: 15 g via ORAL
  Filled 2013-11-25: qty 60

## 2013-11-25 MED ORDER — SODIUM CHLORIDE 0.9 % IV SOLN
INTRAVENOUS | Status: DC
  Administered 2013-11-25: 10 mL/h via INTRAVENOUS

## 2013-11-25 MED ORDER — VANCOMYCIN HCL 500 MG IV SOLR: 500.0000 mg | INTRAVENOUS | Status: DC

## 2013-11-25 MED ORDER — DEXTROSE 50 % IV SOLN
1.0000 | Freq: Once | INTRAVENOUS | Status: AC
  Administered 2013-11-25: 50 mL via INTRAVENOUS
  Filled 2013-11-25: qty 50

## 2013-11-25 MED ORDER — DEXTROSE 50 % IV SOLN
INTRAVENOUS | Status: AC
  Filled 2013-11-25: qty 50

## 2013-11-25 MED ORDER — SODIUM BICARBONATE 650 MG PO TABS
1300.0000 mg | ORAL_TABLET | Freq: Two times a day (BID) | ORAL | Status: DC
  Administered 2013-11-25 – 2013-11-26 (×2): 1300 mg via ORAL
  Filled 2013-11-25 (×3): qty 2

## 2013-11-25 MED ORDER — FUROSEMIDE 10 MG/ML IJ SOLN: 40.0000 mg | Freq: Two times a day (BID) | INTRAMUSCULAR | Status: DC

## 2013-11-25 MED ORDER — INSULIN ASPART 100 UNIT/ML ~~LOC~~ SOLN
0.0000 [IU] | Freq: Three times a day (TID) | SUBCUTANEOUS | Status: DC
  Administered 2013-11-25: 5 [IU] via SUBCUTANEOUS
  Administered 2013-11-25: 3 [IU] via SUBCUTANEOUS
  Administered 2013-11-26: 1 [IU] via SUBCUTANEOUS

## 2013-11-25 NOTE — Progress Notes (Signed)
PULMONARY / CRITICAL CARE MEDICINE  Name: [name] [name] MRN: [id number] DOB: [birth year]    ADMISSION DATE:  11/22/2013 CONSULTATION DATE:  11/24/2013  REFERRING MD :  Graciella Freer PRIMARY SERVICE:  PCCM  CHIEF COMPLAINT:  Hypotension  BRIEF PATIENT DESCRIPTION: 58 yo admitted 6/20 with acute on chronic renal failure, worsening metabolic acidosis and suspected bronchitis.  On 6/22 was noted to be hypotensive.  Developed increased oxygen requirements aftre receiving several fluid boluses.  SIGNIFICANT EVENTS / STUDIES:  6/22  TTE >>> 6/22  Venous Doppler LE >>>  LINES / TUBES: PIV  CULTURES: 6/22 Blood >>>  ANTIBIOTICS: Zosyn 6/22 >>> Vancomycin 6/22 >>> Levaquin 6/22 >>>  Subjective: No events overnight, BP improved.  VITAL SIGNS: Temp:  [97.6 F (36.4 C)-100.3 F (37.9 C)] 97.6 F (36.4 C) (06/23 1134) Pulse Rate:  [80-120] 88 (06/23 1134) Resp:  [11-27] 16 (06/23 1134) BP: (74-118)/(47-77) 118/77 mmHg (06/23 1134) SpO2:  [90 %-100 %] 100 % (06/23 1134)  HEMODYNAMICS:   VENTILATOR SETTINGS:   INTAKE / OUTPUT: Intake/Output     06/22 0701 - 06/23 0700 06/23 0701 - 06/24 0700   P.O. 120    I.V. (mL/kg) 135.8 (2.9) 26.4 (0.6)   IV Piggyback 150 50   Total Intake(mL/kg) 405.8 (8.7) 76.4 (1.6)   Urine (mL/kg/hr) 600 (0.5)    Total Output 600     Net -194.2 +76.4        Urine Occurrence 1 x    Stool Occurrence 2 x     PHYSICAL EXAMINATION: General:  Resting in no distress Neuro:  Awake, alert, non focal HEENT:  PERRL, moist membrames Cardiovascular:  Regular, tachycardic Lungs:  Bilateral air entry, scatted rhonchi / rales Abdomen:  Soft, nontender, bowel sounds normal Musculoskeletal:  Moves all extremities, no edema Skin:  Intact  LABS: CBC  Recent Labs Lab 11/22/13 1110 11/23/13 0512 11/24/13 0355  WBC 10.7* 10.7* 12.8*  HGB 9.8* 7.8* 8.1*  HCT 28.7* 22.9* 24.1*  PLT 225 191 178   Coag's No results found for this basename: APTT, INR,  in the  last 168 hours BMET  Recent Labs Lab 11/24/13 0355 11/25/13 0155 11/25/13 0826  NA 131* 134* 135*  K 5.0 6.2* 5.6*  CL 101 106 106  CO2 16* 15* 16*  BUN 32* 37* 39*  CREATININE 2.16* 2.41* 2.49*  GLUCOSE 197* 283* 317*   Electrolytes  Recent Labs Lab 11/22/13 1450 11/23/13 0512 11/24/13 0355 11/25/13 0155 11/25/13 0826 11/25/13 0950  CALCIUM  --  8.1* 8.1* 8.2* 8.7  --   MG 1.1* 1.5 1.7 1.6  --   --   PHOS 4.5  --   --   --   --  2.8   Sepsis Markers  Recent Labs Lab 11/24/13 1418 11/24/13 2058 11/25/13 0155  LATICACIDVEN 5.0* 3.4* 2.3*   ABG  Recent Labs Lab 11/22/13 1224 11/24/13 0915  PHART 7.224* 7.285*  PCO2ART 30.8* 29.0*  PO2ART 83.0 59.7*   Liver Enzymes  Recent Labs Lab 11/22/13 1450 11/23/13 0512  AST 37 24  ALT 13 9  ALKPHOS 287* 231*  BILITOT 0.3 <0.2*  ALBUMIN 2.8* 2.2*   Cardiac Enzymes  Recent Labs Lab 11/24/13 0839 11/24/13 1418 11/24/13 2058  TROPONINI <0.30 <0.30 <0.30   Glucose No results found for this basename: GLUCAP,  in the last 168 hours  IMAGING: Dg Chest Port 1 View  11/25/2013   CLINICAL DATA:  Followup pulmonary edema  EXAM: PORTABLE CHEST - 1 VIEW  COMPARISON:  11/24/2013  FINDINGS: Bilateral effusions with perihilar and more confluent lung base opacity is stable from the prior exam. Cardiac silhouette is mildly enlarged. No pneumothorax.  IMPRESSION: No significant change from the previous day's study. Findings consistent with pulmonary edema with bilateral effusions.   Electronically Signed   By: Lajean Manes M.D.   On: 11/25/2013 07:19   Dg Chest Port 1 View  11/24/2013   CLINICAL DATA:  Chest pain, shortness of breath.  EXAM: PORTABLE CHEST - 1 VIEW  COMPARISON:  November 22, 2013.  FINDINGS: Mild cardiomegaly is noted. Bilateral perihilar and basilar opacities are noted most consistent with pulmonary edema with mild associated pleural effusions. No pneumothorax is noted. Bony thorax is intact.  IMPRESSION:  Bilateral perihilar and basilar opacities are noted most consistent with pulmonary edema. Mild bilateral pleural effusions are noted.   Electronically Signed   By: Sabino Dick M.D.   On: 11/24/2013 08:29    ASSESSMENT / PLAN:  PULMONARY A:   Acute hypoxemic respiratory failure Likely pulmonary edema Bilateral pleural effusions Possible pneumonia P:   - Supplemental O2 as needed. - IS and flutter valve.  CARDIOVASCULAR A:  Hypotension in setting of adrenal insufficiency ( cortisol 10 on 12/01/2012 ) Possible sepsis Possible pulmonary embolism   P:  - Goal MAP>60 - Renal function is prohibitive of chest CTA - Bilateral LE venous Doppler - TTE pending - Empirical Heparin gtt - ASA - Hold Norvasc  RENAL A:   Acute on chronic renal failure Acute on chronic metabolic acidosis ( likely combination of gap ( lactic ) and non-gap ( renal loss ) ) Clinically fluid overloaded Hyponatremia Hypomagnesemia Hyperkalemia P:   - Trend BMP - Hesitant to give bicarbonate IV as may contribute to fluid overload, lasix ordered by primary team - Bicarbonate PO - Mg oxide  GASTROINTESTINAL A:   GERD on PPI Alcoholic liver cirrhosis P:   - Diet - Protonix  HEMATOLOGIC A:   Chronic anemia Therapeutic anticoagulation for presumed PE P:  - Trend CBC - Heparin gtt  INFECTIOUS A:   Possible pneumonia P:   - Cultures and antibiotics as above - Levaquin added for atypical coverage  ENDOCRINE  A:   Adrenal insufficiency  ( cortisol 10 on 12/01/2012 ) At risk for hyperglycemia / hypoglycemia P:   - Hydrocortisone 50 mg IV q6h - SSI  NEUROLOGIC A:   Neuropathy  Depression P:   - Gabapentin  - Prozac  PCCM will sign off, please call back if needed.  I have personally obtained history, examined patient, evaluated and interpreted laboratory and imaging results, reviewed medical records, formulated assessment / plan and placed orders.  Rush Farmer, M.D. [facility]  Pulmonary/Critical Care Medicine. Pager: [phone]. After hours pager: [phone].   11/25/2013, 12:38 PM

## 2013-11-25 NOTE — Consult Note (Signed)
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 female with past medical history significant for hypertension, hyperlipidemia, alcoholism (with history of seizures related to withdrawal and even a subdural hematoma brought on by such). She also appears to have some CKD that was progressive in nature with creatinine in the mid ones early this year. She saw Dr. Edrick Oh in the office for progressive chronic kidney disease and proteinuria of nearly 3 g. She underwent a kidney biopsy on 10/29/13 which reportedly showed FSG.  According to Dr. Justin Mend was quite scarred and no additional disease specific treatment is indicated. She presented to the hospital on 6/20 with complaints of cough, vomiting and chest pain.  Her creatinine was noted to be 2.47 and was noted to be 1.57  in March. However at [facility] creatinine was noted to be a 2.0 on April 28 and 2.27 on may the 13th.  Her acute on chronic renal failure was felt to be prerenal and she was given fluid boluses.  She then developed some hypoxia and extreme hypotension and a low cortisol level so was started on the steroids and antibiotics.  It didn't appear that she was actually diuresed. Her urine output has been reasonable though.  Her creatinine improved to 2.1 and now is up to 2.4. In addition she's had some hyperkalemia- of note she was on potassium as an outpatient and was on potassium chloride 20 mEq daily until 6/ 22. She's also had a metabolic acidosis. It is for these reasons that I am consulted.     PMHx:   Past Medical History  Diagnosis Date  . Anemia, B12 deficiency   . History of acute pancreatitis   . Right knee pain     No recent imaging on chart  . 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 Dr. Mancel Bale, [facility]  .  Hypertriglyceridemia   . GERD (gastroesophageal reflux disease)   . Vitamin D deficiency   . Subdural hematoma 02/2008    Likely 2/2 trauma from seizure from EtOH withdrawal, chronic in nature, sees Dr. Jerene Bears. Most recent CT head 10/2009 showing stable but persistent hematoma without mass effect.  . History of seizure disorder     Likely 2/2 alcohol abuse  . Hypocalcemia   . Hypomagnesemia   . Failure to thrive in childhood     Unclear etiology  . HTN (hypertension)   . Thrombocytopenia   . Anemia, macrocytic   . Hepatomegaly     On exam  . Joint pain   . Alcohol abuse   . Vitamin D deficiency   . Menopause   . Pancreatitis   . Insomnia   . Hyperlipidemia   . Sinusitis   . Pernicious anemia   . Macrocytic anemia   . 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 Depression   . Fx humeral neck 04/17/2011    Transverse fracture- minimally displaced- managed as outpatient   . ABNORMAL PAP SMEAR, LGSIL 07/23/2008    Annotation: HPV positive CIN I Dr. Mancel Bale, [facility] Qualifier: Diagnosis of  By: Oretha Ellis    . Pneumonia 05/20/2012  . Seizures     "last one was in 2013; don't know what kind" (08/15/2013)  . Arthritis     "shoulders" (08/15/2013)  Past Surgical History  Procedure Laterality Date  . Cesarean section  1983  . Esophagogastroduodenoscopy  07/11/2011    Procedure: ESOPHAGOGASTRODUODENOSCOPY (EGD);  Surgeon: Beryle Beams, MD;  Location: [facility];  Service: Endoscopy;  Laterality: N/A;  . Colonoscopy  07/11/2011    Procedure: COLONOSCOPY;  Surgeon: Beryle Beams, MD;  Location: [facility] ENDOSCOPY;  Service: Endoscopy;  Laterality: N/A;  . Eye surgery Left     "trauma"  . Right colectomy  08/28/2011  . Esophagogastroduodenoscopy N/A 12/01/2012    Procedure: ESOPHAGOGASTRODUODENOSCOPY (EGD);  Surgeon: Irene Shipper, MD;  Location: [facility];  Service: Endoscopy;  Laterality: N/A;  . Colonoscopy with esophagogastroduodenoscopy (egd) Left  08/21/2013    Procedure: COLONOSCOPY WITH ESOPHAGOGASTRODUODENOSCOPY (EGD);  Surgeon: Beryle Beams, MD;  Location: [facility] ENDOSCOPY;  Service: Endoscopy;  Laterality: Left;    Family Hx:  Family History  Problem Relation Age of Onset  . Cancer Mother     Died from stomach cancer and "flesh eating rash  . Heart failure Father     Died in 50s from an MI  . Alcohol abuse Sister     Twin sister drinks a lot, as did both her parents and brothers  . Stroke Brother     Has 7 brothers, 1 with CVA    Social History:  reports that she quit smoking about 3 years ago. Her smoking use included Cigarettes. She has a 20 pack-year smoking history. She has never used smokeless tobacco. She reports that she drinks alcohol. She reports that she uses illicit drugs (Marijuana and Cocaine).  Allergies:  Allergies  Allergen Reactions  . Amitriptyline Hcl Swelling    In the face.  . Doxycycline Hyclate Itching    Feels like something crawling under her skin    Medications: Prior to Admission medications   Medication Sig Start Date End Date Taking? Authorizing Provider  amLODipine (NORVASC) 10 MG tablet Take 10 mg by mouth daily.  09/15/13  Yes Historical Provider, MD  aspirin EC 81 MG tablet Take 1 tablet (81 mg total) by mouth every evening. 01/24/13  Yes Otho Bellows, MD  calcium carbonate (TUMS) 500 MG chewable tablet Chew 6 tablets (1,200 mg of elemental calcium total) by mouth 3 (three) times daily. For bone health 11/11/12  Yes Encarnacion Slates, NP  cetirizine (ZYRTEC) 10 MG tablet Take 1 tablet (10 mg total) by mouth daily. 01/30/13  Yes Otho Bellows, MD  diclofenac sodium (VOLTAREN) 1 % GEL Apply 2 g topically 2 (two) times daily. To affected area 08/27/13  Yes Jeralene Huff, MD  ferrous sulfate 325 (65 FE) MG tablet Take 1 tablet (325 mg total) by mouth 2 (two) times daily with a meal. 01/24/13  Yes Otho Bellows, MD  FLUoxetine (PROZAC) 10 MG capsule Take 1 capsule (10 mg total) by mouth  daily. For depression 02/11/13  Yes Otho Bellows, MD  folic acid (FOLVITE) 1 MG tablet Take 1 tablet (1 mg total) by mouth daily. For folic acid replacement 11/06/13  Yes Sid Falcon, MD  gabapentin (NEURONTIN) 300 MG capsule Take 2 capsules (600 mg total) by mouth 3 (three) times daily. For anxiety/pain control 07/02/13  Yes Otho Bellows, MD  lipase/protease/amylase (CREON-12/PANCREASE) 12000 UNITS CPEP capsule Take 1 capsule by mouth 3 (three) times daily before meals. 04/10/13  Yes Otho Bellows, MD  magnesium oxide (MAG-OX) 400 MG tablet Take 1 tablet (400 mg total) by mouth 4 (four) times daily. 09/18/13  Yes Otho Bellows, MD  meclizine (ANTIVERT) 50 MG tablet Take 1 tablet (50 mg total) by mouth 3 (three) times daily as needed for dizziness. 01/15/13  Yes Clayton Bibles, PA-C  Multiple Vitamin (MULTIVITAMIN WITH MINERALS) TABS tablet Take 1 tablet by mouth daily. For vitamin replacement 04/10/13  Yes Otho Bellows, MD  omeprazole (PRILOSEC) 40 MG capsule Take 1 capsule (40 mg total) by mouth daily. 02/11/13  Yes Otho Bellows, MD  potassium chloride SA (KLOR-CON M20) 20 MEQ tablet Take 1 tablet (20 mEq total) by mouth daily. For low potassium 01/01/13  Yes Otho Bellows, MD  thiamine (VITAMIN B-1) 100 MG tablet Take 1 tablet (100 mg total) by mouth daily. For low thiamine   Yes Otho Bellows, MD  vitamin B-12 (CYANOCOBALAMIN) 250 MCG tablet Take 1 tablet (250 mcg total) by mouth every evening. 04/10/13  Yes Otho Bellows, MD  Vitamin D, Ergocalciferol, (DRISDOL) 50000 UNITS CAPS capsule Take 1 capsule (50,000 Units total) by mouth every 7 (seven) days. Thursdays: For bone health 08/08/13  Yes Otho Bellows, MD  feeding supplement, ENSURE COMPLETE, (ENSURE COMPLETE) LIQD Take 237 mLs by mouth 3 (three) times daily between meals. 08/27/13   Jeralene Huff, MD    I have reviewed the patient's current medications.  Labs:  Results for orders placed during the hospital encounter of  11/22/13 (from the past 48 hour(s))  CBC     Status: Abnormal   Collection Time    11/24/13  3:55 AM      Result Value Ref Range   WBC 12.8 (*) 4.0 - 10.5 K/uL   RBC 2.53 (*) 3.87 - 5.11 MIL/uL   Hemoglobin 8.1 (*) 12.0 - 15.0 g/dL   HCT 24.1 (*) 36.0 - 46.0 %   MCV 95.3  78.0 - 100.0 fL   MCH 32.0  26.0 - 34.0 pg   MCHC 33.6  30.0 - 36.0 g/dL   RDW 13.9  11.5 - 15.5 %   Platelets 178  150 - 400 K/uL  BASIC METABOLIC PANEL     Status: Abnormal   Collection Time    11/24/13  3:55 AM      Result Value Ref Range   Sodium 131 (*) 137 - 147 mEq/L   Potassium 5.0  3.7 - 5.3 mEq/L   Comment: DELTA CHECK NOTED   Chloride 101  96 - 112 mEq/L   CO2 16 (*) 19 - 32 mEq/L   Glucose, Bld 197 (*) 70 - 99 mg/dL   BUN 32 (*) 6 - 23 mg/dL   Creatinine, Ser 2.16 (*) 0.50 - 1.10 mg/dL   Calcium 8.1 (*) 8.4 - 10.5 mg/dL   GFR calc non Af Amer 24 (*) >90 mL/min   GFR calc Af Amer 28 (*) >90 mL/min   Comment: (NOTE)     The eGFR has been calculated using the CKD EPI equation.     This calculation has not been validated in all clinical situations.     eGFR's persistently <90 mL/min signify possible Chronic Kidney     Disease.  MAGNESIUM     Status: None   Collection Time    11/24/13  3:55 AM      Result Value Ref Range   Magnesium 1.7  1.5 - 2.5 mg/dL  CULTURE, BLOOD (ROUTINE X 2)     Status: None   Collection Time    11/24/13  8:30 AM      Result Value  Ref Range   Specimen Description BLOOD RIGHT ARM     Special Requests BOTTLES DRAWN AEROBIC ONLY 1CC     Culture  Setup Time       Value: 11/24/2013 14:18     Performed at [facility]   Culture       Value:        BLOOD CULTURE RECEIVED NO GROWTH TO DATE CULTURE WILL BE HELD FOR 5 DAYS BEFORE ISSUING A FINAL NEGATIVE REPORT     Performed at [facility]   Report Status PENDING    CULTURE, BLOOD (ROUTINE X 2)     Status: None   Collection Time    11/24/13  8:34 AM      Result Value Ref Range   Specimen Description BLOOD  LEFT HAND     Special Requests BOTTLES DRAWN AEROBIC ONLY 3CC     Culture  Setup Time       Value: 11/24/2013 14:16     Performed at [facility]   Culture       Value:        BLOOD CULTURE RECEIVED NO GROWTH TO DATE CULTURE WILL BE HELD FOR 5 DAYS BEFORE ISSUING A FINAL NEGATIVE REPORT     Performed at [facility]   Report Status PENDING    LACTIC ACID, PLASMA     Status: Abnormal   Collection Time    11/24/13  8:39 AM      Result Value Ref Range   Lactic Acid, Venous 3.4 (*) 0.5 - 2.2 mmol/L  TROPONIN I     Status: None   Collection Time    11/24/13  8:39 AM      Result Value Ref Range   Troponin I <0.30  <0.30 ng/mL   Comment:            Due to the release kinetics of cTnI,     a negative result within the first hours     of the onset of symptoms does not rule out     myocardial infarction with certainty.     If myocardial infarction is still suspected,     repeat the test at appropriate intervals.  BLOOD GAS, ARTERIAL     Status: Abnormal   Collection Time    11/24/13  9:15 AM      Result Value Ref Range   O2 Content 6.0     Delivery systems NASAL CANNULA     pH, Arterial 7.285 (*) 7.350 - 7.450   pCO2 arterial 29.0 (*) 35.0 - 45.0 mmHg   pO2, Arterial 59.7 (*) 80.0 - 100.0 mmHg   Bicarbonate 13.3 (*) 20.0 - 24.0 mEq/L   TCO2 14.2  0 - 100 mmol/L   Acid-base deficit 12.0 (*) 0.0 - 2.0 mmol/L   O2 Saturation 88.2     Patient temperature 98.6     Collection site BRACHIAL ARTERY     Drawn by [id number]     Sample type ARTERIAL DRAW     Allens test (pass/fail) PASS  PASS  MRSA PCR SCREENING     Status: None   Collection Time    11/24/13 11:28 AM      Result Value Ref Range   MRSA by PCR NEGATIVE  NEGATIVE   Comment:            The GeneXpert MRSA Assay (FDA     approved for NASAL specimens     only), is one component of  a     comprehensive MRSA colonization     surveillance program. It is not     intended to diagnose MRSA     infection nor to guide  or     monitor treatment for     MRSA infections.  LACTIC ACID, PLASMA     Status: Abnormal   Collection Time    11/24/13  2:18 PM      Result Value Ref Range   Lactic Acid, Venous 5.0 (*) 0.5 - 2.2 mmol/L  TROPONIN I     Status: None   Collection Time    11/24/13  2:18 PM      Result Value Ref Range   Troponin I <0.30  <0.30 ng/mL   Comment:            Due to the release kinetics of cTnI,     a negative result within the first hours     of the onset of symptoms does not rule out     myocardial infarction with certainty.     If myocardial infarction is still suspected,     repeat the test at appropriate intervals.  TROPONIN I     Status: None   Collection Time    11/24/13  8:58 PM      Result Value Ref Range   Troponin I <0.30  <0.30 ng/mL   Comment:            Due to the release kinetics of cTnI,     a negative result within the first hours     of the onset of symptoms does not rule out     myocardial infarction with certainty.     If myocardial infarction is still suspected,     repeat the test at appropriate intervals.  HEPARIN LEVEL (UNFRACTIONATED)     Status: Abnormal   Collection Time    11/24/13  8:58 PM      Result Value Ref Range   Heparin Unfractionated 0.16 (*) 0.30 - 0.70 IU/mL   Comment:            IF HEPARIN RESULTS ARE BELOW     EXPECTED VALUES, AND PATIENT     DOSAGE HAS BEEN CONFIRMED,     SUGGEST FOLLOW UP TESTING     OF ANTITHROMBIN III LEVELS.  LACTIC ACID, PLASMA     Status: Abnormal   Collection Time    11/24/13  8:58 PM      Result Value Ref Range   Lactic Acid, Venous 3.4 (*) 0.5 - 2.2 mmol/L  CORTISOL     Status: None   Collection Time    11/24/13  8:58 PM      Result Value Ref Range   Cortisol, Plasma 79.5     Comment: (NOTE)     Result repeated and verified.     Result confirmed by automatic dilution.     AM:  4.3 - 22.4 ug/dL     PM:  3.1 - 16.7 ug/dL     Performed at [place] ACID, PLASMA     Status: Abnormal    Collection Time    11/25/13  1:55 AM      Result Value Ref Range   Lactic Acid, Venous 2.3 (*) 0.5 - 2.2 mmol/L  HEPARIN LEVEL (UNFRACTIONATED)     Status: None   Collection Time    11/25/13  1:55 AM      Result Value Ref Range  Heparin Unfractionated 0.45  0.30 - 0.70 IU/mL   Comment:            IF HEPARIN RESULTS ARE BELOW     EXPECTED VALUES, AND PATIENT     DOSAGE HAS BEEN CONFIRMED,     SUGGEST FOLLOW UP TESTING     OF ANTITHROMBIN III LEVELS.  BASIC METABOLIC PANEL     Status: Abnormal   Collection Time    11/25/13  1:55 AM      Result Value Ref Range   Sodium 134 (*) 137 - 147 mEq/L   Potassium 6.2 (*) 3.7 - 5.3 mEq/L   Comment: DELTA CHECK NOTED     NO VISIBLE HEMOLYSIS   Chloride 106  96 - 112 mEq/L   CO2 15 (*) 19 - 32 mEq/L   Glucose, Bld 283 (*) 70 - 99 mg/dL   BUN 37 (*) 6 - 23 mg/dL   Creatinine, Ser 2.41 (*) 0.50 - 1.10 mg/dL   Calcium 8.2 (*) 8.4 - 10.5 mg/dL   GFR calc non Af Amer 21 (*) >90 mL/min   GFR calc Af Amer 25 (*) >90 mL/min   Comment: (NOTE)     The eGFR has been calculated using the CKD EPI equation.     This calculation has not been validated in all clinical situations.     eGFR's persistently <90 mL/min signify possible Chronic Kidney     Disease.  MAGNESIUM     Status: None   Collection Time    11/25/13  1:55 AM      Result Value Ref Range   Magnesium 1.6  1.5 - 2.5 mg/dL  BASIC METABOLIC PANEL     Status: Abnormal   Collection Time    11/25/13  8:26 AM      Result Value Ref Range   Sodium 135 (*) 137 - 147 mEq/L   Potassium 5.6 (*) 3.7 - 5.3 mEq/L   Chloride 106  96 - 112 mEq/L   CO2 16 (*) 19 - 32 mEq/L   Glucose, Bld 317 (*) 70 - 99 mg/dL   BUN 39 (*) 6 - 23 mg/dL   Creatinine, Ser 2.49 (*) 0.50 - 1.10 mg/dL   Calcium 8.7  8.4 - 10.5 mg/dL   GFR calc non Af Amer 21 (*) >90 mL/min   GFR calc Af Amer 24 (*) >90 mL/min   Comment: (NOTE)     The eGFR has been calculated using the CKD EPI equation.     This calculation has not  been validated in all clinical situations.     eGFR's persistently <90 mL/min signify possible Chronic Kidney     Disease.  HEPARIN LEVEL (UNFRACTIONATED)     Status: None   Collection Time    11/25/13  9:50 AM      Result Value Ref Range   Heparin Unfractionated 0.47  0.30 - 0.70 IU/mL   Comment:            IF HEPARIN RESULTS ARE BELOW     EXPECTED VALUES, AND PATIENT     DOSAGE HAS BEEN CONFIRMED,     SUGGEST FOLLOW UP TESTING     OF ANTITHROMBIN III LEVELS.  PHOSPHORUS     Status: None   Collection Time    11/25/13  9:50 AM      Result Value Ref Range   Phosphorus 2.8  2.3 - 4.6 mg/dL  GLUCOSE, CAPILLARY     Status: Abnormal   Collection Time  11/25/13  2:01 PM      Result Value Ref Range   Glucose-Capillary 271 (*) 70 - 99 mg/dL     ROS:  A comprehensive review of systems was negative except for: Respiratory: positive for cough and dyspnea on exertion  Physical Exam: Filed Vitals:   11/25/13 1200  BP: 118/73  Pulse: 88  Temp:   Resp: 16     General: Thin black female who is alert, talkative and in no distress HEENT: Pupils are equal round reactive to light symmetrical motions are intact, mucous members are moist Neck: There is no obvious jugular venous distention Heart: Regular rate and rhythm without murmur, gallop, or rub Lungs: Decreased breath sounds at both bases Abdomen: Soft, nontender, nondistended Extremities: Absolutely no peripheral edema Skin: Warm and dry Neuro: Alert. The remainder of neurologic exam is nonfocal  Assessment/Plan: 58 year old black female with progressive chronic kidney disease. Status post recent renal biopsy showing FSG with unfavorable pathology. She now presents to the hospital with hypotension of related to adrenal insufficiency versus sepsis. Chest x-ray consistent with possible pulmonary edema with pleural effusions 1.Renal/stage 3/4 CKD progressive chronic kidney disease with variable proteinuria. Biopsy consistent with FSG  with an unfavorable pathology. Therefore, no disease specific therapy is indicated. Try to focus on good blood pressure control.  An ACE inhibitor would be nice but I would be hesitant to add it on at this time given her variable creatinine level.  2. Hypertension/volume  - her blood pressures is actually quite good. Given the findings on her chest x-ray and probable pulmonary edema with pleural effusions I will start her on scheduled oral Lasix. She also appears to have adrenal insufficiency- is now on solucortef, will need to eventually be changed to something oral.  I will start by changing to q 12 instead of q 6.   3. hyperkalemia  - due to her CKD,  her steroids and also potassium replacement. I agree with stopping her potassium replacement. Her steroids need to be weaned.  I have also increased the amount of sodium bicarbonate that she takes the Lasix should help as well to control potassium levels 4. metabolic acidosis- due to CKD. She is on replacement sodium bicarbonate and I have increased the dose 5. Pulmonary- seems consistent with edema and effusions. However, infectious etiology cannot be ruled out. She is being treated   for both  Thank for this consultation. I will continue to follow with you  GOLDSBOROUGH,KELLIE A 11/25/2013, 2:10 PM

## 2013-11-25 NOTE — Progress Notes (Signed)
Subjective: Patient seen at bedside this AM. Appears improved. K 6.2 this AM, improved to 5.6 after D50/insulin. Also given kayexalate and lasix. Says she feels much better today. Breathing significantly improved, Says she no longer is having chest pain. Eating this AM.   Objective: Vital signs in last 24 hours: Filed Vitals:   11/25/13 0400 11/25/13 0438 11/25/13 0600 11/25/13 0635  BP: 92/56   101/67  Pulse: 88 80 89 84  Temp: 98.7 F (37.1 C)     TempSrc: Oral     Resp: 14 11 19 25   Height:      Weight:      SpO2: 100% 100% 100% 99%   Weight change: 6 lb 5 oz (2.864 kg)  Intake/Output Summary (Last 24 hours) at 11/25/13 0653 Last data filed at 11/25/13 0600  Gross per 24 hour  Intake 405.83 ml  Output    600 ml  Net -194.17 ml   Physical Exam: General: Alert, cooperative, NAD. HEENT: PERRL, EOMI. Moist mucus membranes Neck: Full range of motion without pain, supple, no lymphadenopathy or carotid bruits.  Lungs: Bibasilar rales, w/ improved air entry in the RLL. No wheezes.  Heart: Tachycardia, regular rhythm, no murmurs, gallops, or rubs Abdomen: Soft, non-tender, non-distended, BS + Extremities: No cyanosis or edema. Clubbing present. Neurologic: Alert & oriented X3, cranial nerves II-XII intact, strength grossly intact, sensation intact to light touch   Lab Results: Basic Metabolic Panel:  Recent Labs Lab 11/22/13 1450  11/24/13 0355 11/25/13 0155  NA  --   < > 131* 134*  K  --   < > 5.0 6.2*  CL  --   < > 101 106  CO2  --   < > 16* 15*  GLUCOSE  --   < > 197* 283*  BUN  --   < > 32* 37*  CREATININE  --   < > 2.16* 2.41*  CALCIUM  --   < > 8.1* 8.2*  MG 1.1*  < > 1.7 1.6  PHOS 4.5  --   --   --   < > = values in this interval not displayed. Liver Function Tests:  Recent Labs Lab 11/22/13 1450 11/23/13 0512  AST 37 24  ALT 13 9  ALKPHOS 287* 231*  BILITOT 0.3 <0.2*  PROT 9.1* 7.5  ALBUMIN 2.8* 2.2*   CBC:  Recent Labs Lab 11/22/13 1110  11/23/13 0512 11/24/13 0355  WBC 10.7* 10.7* 12.8*  NEUTROABS 5.1  --   --   HGB 9.8* 7.8* 8.1*  HCT 28.7* 22.9* 24.1*  MCV 94.1 94.6 95.3  PLT 225 191 178   Urine Drug Screen: Drugs of Abuse     Component Value Date/Time   LABOPIA POSITIVE* 11/22/2013 1406   LABOPIA NEG 09/18/2011 0936   COCAINSCRNUR NONE DETECTED 11/22/2013 1406   COCAINSCRNUR NEG 09/18/2011 0936   LABBENZ NONE DETECTED 11/22/2013 1406   LABBENZ NEG 09/18/2011 0936   LABBENZ NEG 04/10/2011 1130   AMPHETMU NONE DETECTED 11/22/2013 1406   AMPHETMU NEG 04/10/2011 1130   THCU NONE DETECTED 11/22/2013 1406   LABBARB NONE DETECTED 11/22/2013 1406   LABBARB NEG 09/18/2011 0936    Urinalysis:  Recent Labs Lab 11/22/13 1406  COLORURINE YELLOW  LABSPEC 1.015  PHURINE 5.5  GLUCOSEU NEGATIVE  HGBUR NEGATIVE  BILIRUBINUR NEGATIVE  KETONESUR NEGATIVE  PROTEINUR 100*  UROBILINOGEN 0.2  NITRITE NEGATIVE  LEUKOCYTESUR MODERATE*   Studies/Results: Dg Chest Port 1 View  11/24/2013   CLINICAL  DATA:  Chest pain, shortness of breath.  EXAM: PORTABLE CHEST - 1 VIEW  COMPARISON:  November 22, 2013.  FINDINGS: Mild cardiomegaly is noted. Bilateral perihilar and basilar opacities are noted most consistent with pulmonary edema with mild associated pleural effusions. No pneumothorax is noted. Bony thorax is intact.  IMPRESSION: Bilateral perihilar and basilar opacities are noted most consistent with pulmonary edema. Mild bilateral pleural effusions are noted.   Electronically Signed   By: Sabino Dick M.D.   On: 11/24/2013 08:29   Medications: I have reviewed the patient's current medications. Scheduled Meds: . aspirin EC  81 mg Oral QPM  . ceFEPime (MAXIPIME) IV  1 g Intravenous Q24H  . feeding supplement (ENSURE COMPLETE)  237 mL Oral TID BM  . ferrous sulfate  325 mg Oral BID WC  . FLUoxetine  10 mg Oral Daily  . folic acid  1 mg Oral Daily  . furosemide  20 mg Intravenous Once  . gabapentin  600 mg Oral TID  . heparin  2,000  Units Intravenous Once  . hydrocortisone sodium succinate  50 mg Intravenous Q6H  . levofloxacin (LEVAQUIN) IV  750 mg Intravenous Q48H  . lipase/protease/amylase  1 capsule Oral TID AC  . magnesium oxide  400 mg Oral QID  . pantoprazole  40 mg Oral Daily  . sodium bicarbonate  650 mg Oral BID  . sodium chloride  1,000 mL Intravenous Once  . sodium polystyrene  15 g Oral Once  . thiamine  100 mg Oral Daily  . vancomycin  500 mg Intravenous Q24H  . vitamin B-12  250 mcg Oral QPM   Continuous Infusions: . heparin 800 Units/hr (11/25/13 0600)   PRN Meds:.acetaminophen, guaiFENesin-codeine, promethazine  Assessment/Plan: [name] is a 58 y.o. 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   Hyperkalemia- Most likely 2/2 worsening renal failure. K 6.2 this AM, decreased to 5.6 most recently. No peaked T waves, prolonged PR, or prolonged QRS present. Given Kayexalate 15 mg once + Lasix 40 mg IV + 1 amp D50 + Novolog 10 units IV. -Closely monitor BMP  Acute Hypoxic Respiratory Failure- Improved today. Patient SpO2 ~100% on 4L O2 via [place]. No significant issues overnight. CXR repeated this AM, still consistent w/ pulmonary edema. Lactic acid trending down, troponin negative x3. Do not suspect PE at this time, patient no longer tachycardic, O2 demand has decreased, LE dopplers negative. -Continue Vancomycin + Cefepime IV -Discontinue Heparin gtt -ECHO results pending  -Given Lasix 40 mg IV this AM  Hypotension- Improved overnight. Given Solu-Cortef 100 mg IV yesterday. Etiology 2/2 sepsis vs questionable adrenal insufficiency. -Continue Solu-Cortef 50 mg IV qd for now -Continue to monitor closely  Acute on CKD- Patient w/ baseline CKD stage III, had a renal cortical biopsy in  10/2013 which was suggestive of FSGS. Worsening Cr may be reflective of new baseline and worsening renal disease. Cr trend as follows:   Recent Labs Lab 11/22/13 1110 11/23/13 0512 11/24/13 0355 11/25/13 0155  CREATININE 2.47* 2.13* 2.16* 2.41*  -Lasix as above -Continue to replete electrolytes as necessary -Continue NaHCO3 po 650 mg bid  AG Metabolic Acidosis- Patient w/ chronic anion gap acidosis in the setting of worsening renal function, baseline HCO3 ~16-18. This AM, HCO3 15, AG 14. -Continue NaHCO3 650 mg po bid -Repeat  BMP in AM -Patient will need renal follow up on discharge.  Hyperglycemia- 2/2 Steroids. No h/o DM type II -ISS-S w/ CBG's AC/HS  Acute Viral Bronchitis- Cough improved. Still question pneumonia. -ABx as above -Guafenesin-codeine 5 mL q4h prn for cough  -Phenergan prn for vomiting   Alcohol Abuse- Claims she has been abstinent for 3-4 months. -CIWA protocol  -Replete electrolytes as discussed below  -Continue home vitamins (thiamine, B12)  -Continue Creon   Anemia of Chronic Disease- Baseline Hb ~9.0.  -Continue to monitor  -Continue home ferrous sulfate 325 mg bid  -CBC in AM   HTN- Normotensive this AM -Hold home Norvasc  Dispo: Disposition is deferred at this time, awaiting improvement of current medical problems.  Anticipated discharge in approximately 1-2 day(s).   The patient does have a current PCP Otho Bellows, MD) and does need an [facility] hospital follow-up appointment after discharge.  The patient does not have transportation limitations that hinder transportation to clinic appointments.  .Services Needed at time of discharge: Y = Yes, Blank = No PT:   OT:   RN:   Equipment:   Other:     LOS: 3 days   Corky Sox, MD 11/25/2013, 6:53 AM

## 2013-11-25 NOTE — Progress Notes (Signed)
PHARMACY NOTE  Pharmacy Consult :  58 y.o. female is currently on Heparin for r/o PE.  Heparin Dosing Wt :  43 kg  Hematology :  Recent Labs  11/22/13 1110 11/23/13 0512 11/24/13 0355 11/24/13 2058 11/25/13 0155  HGB 9.8* 7.8* 8.1*  --   --   HCT 28.7* 22.9* 24.1*  --   --   PLT 225 191 178  --   --   HEPARINUNFRC  --   --   --  0.16* 0.45  CREATININE 2.47* 2.13* 2.16*  --  2.41*    Current Medication[s] Include:  Infusion[s]: Infusions:  . heparin 800 Units/hr (11/25/13 0400)    Assessment :  58 y/o female on Heparin infusion while ruling out a PE.  Heparin rate 800 units/hr.  The Heparin level was drawn 3 hours early so level unlikely to be steady state.  The Heparin level reported as 0.45 units/ml.  Will need to reorder a follow up Heparin level later this AM to confirm therapeutic levels.  No evidence of bleeding complications observed.  Goal :  Heparin level 0.3-0.7 units/ml.  Plan : 1. Heparin will be continued at the same rate.   The next Heparin Level will be rechecked at 1000 AM  Stramoski, Craig Guess,  Pharm.D  11/25/2013  5:10 AM

## 2013-11-25 NOTE — Progress Notes (Signed)
  Date: 11/25/2013  Patient name: [name] [name]  Medical record number: [id number]  Date of birth: [birth year]   This patient has been seen and the plan of care was discussed with the house staff. Please see their note for complete details. I concur with their findings with the following additions/corrections:  [name] is clinically improved today. Etiology of acute decompensation likely related to volume overload (in the setting of worsening renal function) vs. Pneumonia (hypoxia, low blood pressure, elevated LA, tachycardia) or a combination of the two.  As her BP was improved today, she was given a dose of lasix to help with her pulmonary edema.  She is on antibiotics and cultures are pending.  She is oxygenating well on 4LNC and this can likely be titrated down.  Of note, her K was elevated this AM and she was treated with a decline to 5.6.  Given the hyperkalemia, worsening renal function and likely volume overload due to that, it may be reasonable to have the renal team see her while she is inpatient.   Sid Falcon, MD 11/25/2013, 11:11 AM

## 2013-11-26 ENCOUNTER — Inpatient Hospital Stay (HOSPITAL_COMMUNITY): Payer: PRIVATE HEALTH INSURANCE

## 2013-11-26 DIAGNOSIS — I509 Heart failure, unspecified: Secondary | ICD-10-CM

## 2013-11-26 DIAGNOSIS — I5030 Unspecified diastolic (congestive) heart failure: Secondary | ICD-10-CM

## 2013-11-26 LAB — IRON AND TIBC
Iron: 102 ug/dL (ref 42–135)
Saturation Ratios: 74 % — ABNORMAL HIGH (ref 20–55)
TIBC: 138 ug/dL — ABNORMAL LOW (ref 250–470)
UIBC: 36 ug/dL — AB (ref 125–400)

## 2013-11-26 LAB — RENAL FUNCTION PANEL
ALBUMIN: 1.9 g/dL — AB (ref 3.5–5.2)
BUN: 39 mg/dL — ABNORMAL HIGH (ref 6–23)
CHLORIDE: 102 meq/L (ref 96–112)
CO2: 18 mEq/L — ABNORMAL LOW (ref 19–32)
Calcium: 8.6 mg/dL (ref 8.4–10.5)
Creatinine, Ser: 2.67 mg/dL — ABNORMAL HIGH (ref 0.50–1.10)
GFR calc Af Amer: 22 mL/min — ABNORMAL LOW (ref >90)
GFR calc non Af Amer: 19 mL/min — ABNORMAL LOW (ref >90)
Glucose, Bld: 142 mg/dL — ABNORMAL HIGH (ref 70–99)
PHOSPHORUS: 3.1 mg/dL (ref 2.3–4.6)
POTASSIUM: 4.2 meq/L (ref 3.7–5.3)
Sodium: 137 mEq/L (ref 137–147)

## 2013-11-26 LAB — GLUCOSE, CAPILLARY
GLUCOSE-CAPILLARY: 146 mg/dL — AB (ref 70–99)
Glucose-Capillary: 114 mg/dL — ABNORMAL HIGH (ref 70–99)

## 2013-11-26 LAB — CBC
HCT: 22.5 % — ABNORMAL LOW (ref 36.0–46.0)
Hemoglobin: 7.6 g/dL — ABNORMAL LOW (ref 12.0–15.0)
MCH: 32.6 pg (ref 26.0–34.0)
MCHC: 33.8 g/dL (ref 30.0–36.0)
MCV: 96.6 fL (ref 78.0–100.0)
PLATELETS: 180 10*3/uL (ref 150–400)
RBC: 2.33 MIL/uL — ABNORMAL LOW (ref 3.87–5.11)
RDW: 14.5 % (ref 11.5–15.5)
WBC: 25 10*3/uL — AB (ref 4.0–10.5)

## 2013-11-26 LAB — FERRITIN: FERRITIN: 1035 ng/mL — AB (ref 10–291)

## 2013-11-26 MED ORDER — PREDNISONE 20 MG PO TABS
20.0000 mg | ORAL_TABLET | Freq: Once | ORAL | Status: AC
  Administered 2013-11-26: 20 mg via ORAL
  Filled 2013-11-26: qty 1

## 2013-11-26 MED ORDER — SODIUM BICARBONATE 650 MG PO TABS: 1300.0000 mg | ORAL_TABLET | Freq: Two times a day (BID) | ORAL | Status: DC

## 2013-11-26 MED ORDER — FUROSEMIDE 40 MG PO TABS: 40.0000 mg | ORAL_TABLET | Freq: Every day | ORAL | Status: DC

## 2013-11-26 MED ORDER — PREDNISONE 10 MG PO TABS: 10.0000 mg | ORAL_TABLET | Freq: Every day | ORAL | Status: DC

## 2013-11-26 MED ORDER — DARBEPOETIN ALFA-POLYSORBATE 100 MCG/0.5ML IJ SOLN
100.0000 ug | INTRAMUSCULAR | Status: DC
  Filled 2013-11-26: qty 0.5

## 2013-11-26 MED ORDER — PREDNISONE 20 MG PO TABS: 20.0000 mg | ORAL_TABLET | Freq: Every day | ORAL | Status: DC

## 2013-11-26 MED ORDER — GUAIFENESIN-CODEINE 100-10 MG/5ML PO SOLN: 5.0000 mL | ORAL | Status: DC | PRN

## 2013-11-26 MED ORDER — HEPARIN SODIUM (PORCINE) 5000 UNIT/ML IJ SOLN
5000.0000 [IU] | Freq: Three times a day (TID) | INTRAMUSCULAR | Status: DC
  Administered 2013-11-26: 5000 [IU] via SUBCUTANEOUS
  Filled 2013-11-26 (×4): qty 1

## 2013-11-26 MED ORDER — DARBEPOETIN ALFA-POLYSORBATE 100 MCG/0.5ML IJ SOLN
100.0000 ug | Freq: Once | INTRAMUSCULAR | Status: AC
  Administered 2013-11-26: 100 ug via SUBCUTANEOUS
  Filled 2013-11-26: qty 0.5

## 2013-11-26 NOTE — Progress Notes (Signed)
Subjective:   Events noted- now off O2 with good sats, feels well, sitting up eating breakfast.  Good UOP on lasix creatinine stable, K and bicarb improved Objective Vital signs in last 24 hours: Filed Vitals:   11/26/13 0615 11/26/13 0700 11/26/13 0800 11/26/13 0810  BP: 115/71 124/74  131/79  Pulse: 70 69 91   Temp:    98.6 F (37 C)  TempSrc:    Oral  Resp: 23 11 32 12  Height:      Weight:      SpO2: 100% 100% 94% 100%   Weight change: -0.732 kg (-1 lb 9.8 oz)  Intake/Output Summary (Last 24 hours) at 11/26/13 0848 Last data filed at 11/26/13 0800  Gross per 24 hour  Intake  638.4 ml  Output   2554 ml  Net -1915.6 ml    Assessment/Plan: 58 year old black female with progressive chronic kidney disease. Status post recent renal biopsy showing FSG with unfavorable pathology. She now presents to the hospital with hypotension of related to adrenal insufficiency versus sepsis. Chest x-ray consistent with possible pulmonary edema with pleural effusions  1.Renal/stage 3/4 CKD progressive chronic kidney disease with variable proteinuria. Biopsy consistent with FSG with an unfavorable pathology. Therefore, no disease specific therapy is indicated. Try to focus on good blood pressure control. An ACE inhibitor would be nice but I would be hesitant to add it on at this time given her variable creatinine level.  2. Hypertension/volume - her blood pressures is actually quite good. Given the findings on her chest x-ray and probable pulmonary edema with pleural effusions she should be continued on lasix.  She also appears to have adrenal insufficiency- is now on solucortef, will need to eventually be changed to something oral for discharge 3. hyperkalemia - due to her CKD, her steroids and also potassium replacement. Better with stopping repletion and increasing bicarb and also with lasix. 4. metabolic acidosis- due to CKD. She is on replacement sodium bicarbonate , would continue at current dose 5.  Pulmonary- seems consistent with edema and effusions. However, infectious etiology cannot be ruled out. She is being treated  for both 6. Anemia- out of proportion to her kidney disease.  Will give dose of aranesp , order iron stores and can be evaluated for it to be continued as OP  Patient has follow up with Dr. Justin Mend on July 1.  I have no further suggestions.  Patient should not be that far away from discharge.  Renal will sign off, call with questions    GOLDSBOROUGH,KELLIE A    Labs: Basic Metabolic Panel:  Recent Labs Lab 11/22/13 1450  11/25/13 0826 11/25/13 0950 11/25/13 1825 11/26/13 0240  NA  --   < > 135*  --  137 137  K  --   < > 5.6*  --  4.2 4.2  CL  --   < > 106  --  104 102  CO2  --   < > 16*  --  17* 18*  GLUCOSE  --   < > 317*  --  294* 142*  BUN  --   < > 39*  --  39* 39*  CREATININE  --   < > 2.49*  --  2.66* 2.67*  CALCIUM  --   < > 8.7  --  8.6 8.6  PHOS 4.5  --   --  2.8  --  3.1  < > = values in this interval not displayed. Liver Function Tests:  Recent Labs Lab 11/22/13 1450 11/23/13 0512 11/26/13 0240  AST 37 24  --   ALT 13 9  --   ALKPHOS 287* 231*  --   BILITOT 0.3 <0.2*  --   PROT 9.1* 7.5  --   ALBUMIN 2.8* 2.2* 1.9*   No results found for this basename: LIPASE, AMYLASE,  in the last 168 hours No results found for this basename: AMMONIA,  in the last 168 hours CBC:  Recent Labs Lab 11/22/13 1110 11/23/13 0512 11/24/13 0355 11/26/13 0240  WBC 10.7* 10.7* 12.8* 25.0*  NEUTROABS 5.1  --   --   --   HGB 9.8* 7.8* 8.1* 7.6*  HCT 28.7* 22.9* 24.1* 22.5*  MCV 94.1 94.6 95.3 96.6  PLT 225 191 178 180   Cardiac Enzymes:  Recent Labs Lab 11/24/13 0839 11/24/13 1418 11/24/13 2058  TROPONINI <0.30 <0.30 <0.30   CBG:  Recent Labs Lab 11/25/13 1401 11/25/13 1605 11/25/13 1935 11/26/13 0812  GLUCAP 271* 294* 335* 114*    Iron Studies: No results found for this basename: IRON, TIBC, TRANSFERRIN, FERRITIN,  in the last 72  hours Studies/Results: Dg Chest Port 1 View  11/26/2013   CLINICAL DATA:  Pulmonary  EXAM: PORTABLE CHEST - 1 VIEW  COMPARISON:  11/25/2013  FINDINGS: Cardiac enlargement. Pulmonary vascular congestion and bilateral edema is unchanged. Bilateral effusion and bibasilar atelectasis also unchanged.  IMPRESSION: Congestive heart failure with edema and effusions unchanged from the prior study.   Electronically Signed   By: Franchot Gallo M.D.   On: 11/26/2013 07:34   Dg Chest Port 1 View  11/25/2013   CLINICAL DATA:  Followup pulmonary edema  EXAM: PORTABLE CHEST - 1 VIEW  COMPARISON:  11/24/2013  FINDINGS: Bilateral effusions with perihilar and more confluent lung base opacity is stable from the prior exam. Cardiac silhouette is mildly enlarged. No pneumothorax.  IMPRESSION: No significant change from the previous day's study. Findings consistent with pulmonary edema with bilateral effusions.   Electronically Signed   By: Lajean Manes M.D.   On: 11/25/2013 07:19   Medications: Infusions: . sodium chloride 10 mL/hr at 11/26/13 0000    Scheduled Medications: . aspirin EC  81 mg Oral QPM  . ceFEPime (MAXIPIME) IV  1 g Intravenous Q24H  . feeding supplement (ENSURE COMPLETE)  237 mL Oral TID BM  . ferrous sulfate  325 mg Oral BID WC  . FLUoxetine  10 mg Oral Daily  . folic acid  1 mg Oral Daily  . furosemide  40 mg Oral BID  . gabapentin  600 mg Oral TID  . heparin subcutaneous  5,000 Units Subcutaneous 3 times per day  . hydrocortisone sodium succinate  50 mg Intravenous Q12H  . insulin aspart  0-9 Units Subcutaneous TID WC  . levofloxacin (LEVAQUIN) IV  750 mg Intravenous Q48H  . lipase/protease/amylase  1 capsule Oral TID AC  . magnesium oxide  400 mg Oral QID  . pantoprazole  40 mg Oral Daily  . sodium bicarbonate  1,300 mg Oral BID  . thiamine  100 mg Oral Daily  . [START ON 11/27/2013] vancomycin  500 mg Intravenous Q48H  . vitamin B-12  250 mcg Oral QPM    have reviewed scheduled and  prn medications.  Physical Exam: General: sitting up eating breakfast, looks good Heart: RRR Lungs: still with decreased BS at bases Abdomen: soft, non tender Extremities: no edema    11/26/2013,8:48 AM  LOS: 4 days

## 2013-11-26 NOTE — Progress Notes (Signed)
  Date: 11/26/2013  Patient name: [name] [name]  Medical record number: [id number]  Date of birth: 10-Jul-1955   This patient has been seen and the plan of care was discussed with the house staff. Please see their note for complete details. I concur with their findings with the following additions/corrections:  Improved clinical status.  Hypoxia likely related to volume overload.  Will be discharged on lasix.  There was a suggestion that she may have adrenal insufficiency in the hospital, though no objective data is available. She will be sent home on an oral taper of steroids and further work up can be done as an outpatient.   Sid Falcon, MD 11/26/2013, 12:49 PM

## 2013-11-26 NOTE — Progress Notes (Signed)
Subjective: Patient seen at bedside this AM. Sleeping comfortably in bed. Breathing significantly improved, no SOB on exam. Diuresed well yesterday (~2L). No fever, chills, nausea, or vomiting. Says her cough has also improved.   Objective: Vital signs in last 24 hours: Filed Vitals:   11/26/13 0615 11/26/13 0700 11/26/13 0800 11/26/13 0810  BP: 115/71 124/74  131/79  Pulse: 70 69 91   Temp:    98.6 F (37 C)  TempSrc:    Oral  Resp: 23 11 32 12  Height:      Weight:      SpO2: 100% 100% 94% 100%   Weight change: -1 lb 9.8 oz (-0.732 kg)  Intake/Output Summary (Last 24 hours) at 11/26/13 1018 Last data filed at 11/26/13 0800  Gross per 24 hour  Intake    636 ml  Output   2554 ml  Net  -1918 ml   Physical Exam: General: Alert, cooperative, NAD. HEENT: PERRL, EOMI. Moist mucus membranes Neck: Full range of motion without pain, supple, no lymphadenopathy or carotid bruits.  Lungs: Air entry equal bilaterally. Still w/ bibasilar crackles, but much improved. No wheezes.  Heart: RRR, no murmurs, gallops, or rubs Abdomen: Soft, non-tender, non-distended, BS + Extremities: No cyanosis or edema. Clubbing present. Neurologic: Alert & oriented X3, cranial nerves II-XII intact, strength grossly intact, sensation intact to light touch   Lab Results: Basic Metabolic Panel:  Recent Labs Lab 11/24/13 0355 11/25/13 0155  11/25/13 0950 11/25/13 1825 11/26/13 0240  NA 131* 134*  < >  --  137 137  K 5.0 6.2*  < >  --  4.2 4.2  CL 101 106  < >  --  104 102  CO2 16* 15*  < >  --  17* 18*  GLUCOSE 197* 283*  < >  --  294* 142*  BUN 32* 37*  < >  --  39* 39*  CREATININE 2.16* 2.41*  < >  --  2.66* 2.67*  CALCIUM 8.1* 8.2*  < >  --  8.6 8.6  MG 1.7 1.6  --   --   --   --   PHOS  --   --   --  2.8  --  3.1  < > = values in this interval not displayed. Liver Function Tests:  Recent Labs Lab 11/22/13 1450 11/23/13 0512 11/26/13 0240  AST 37 24  --   ALT 13 9  --   ALKPHOS  287* 231*  --   BILITOT 0.3 <0.2*  --   PROT 9.1* 7.5  --   ALBUMIN 2.8* 2.2* 1.9*   CBC:  Recent Labs Lab 11/22/13 1110  11/24/13 0355 11/26/13 0240  WBC 10.7*  < > 12.8* 25.0*  NEUTROABS 5.1  --   --   --   HGB 9.8*  < > 8.1* 7.6*  HCT 28.7*  < > 24.1* 22.5*  MCV 94.1  < > 95.3 96.6  PLT 225  < > 178 180  < > = values in this interval not displayed. Urine Drug Screen: Drugs of Abuse     Component Value Date/Time   LABOPIA POSITIVE* 11/22/2013 1406   LABOPIA NEG 09/18/2011 0936   COCAINSCRNUR NONE DETECTED 11/22/2013 1406   COCAINSCRNUR NEG 09/18/2011 0936   LABBENZ NONE DETECTED 11/22/2013 1406   LABBENZ NEG 09/18/2011 0936   LABBENZ NEG 04/10/2011 1130   AMPHETMU NONE DETECTED 11/22/2013 1406   AMPHETMU NEG 04/10/2011 1130   THCU NONE DETECTED  11/22/2013 1406   LABBARB NONE DETECTED 11/22/2013 1406   LABBARB NEG 09/18/2011 0936    Urinalysis:  Recent Labs Lab 11/22/13 1406  COLORURINE YELLOW  LABSPEC 1.015  PHURINE 5.5  GLUCOSEU NEGATIVE  HGBUR NEGATIVE  BILIRUBINUR NEGATIVE  KETONESUR NEGATIVE  PROTEINUR 100*  UROBILINOGEN 0.2  NITRITE NEGATIVE  LEUKOCYTESUR MODERATE*   Studies/Results: Dg Chest Port 1 View  11/26/2013   CLINICAL DATA:  Pulmonary  EXAM: PORTABLE CHEST - 1 VIEW  COMPARISON:  11/25/2013  FINDINGS: Cardiac enlargement. Pulmonary vascular congestion and bilateral edema is unchanged. Bilateral effusion and bibasilar atelectasis also unchanged.  IMPRESSION: Congestive heart failure with edema and effusions unchanged from the prior study.   Electronically Signed   By: Franchot Gallo M.D.   On: 11/26/2013 07:34   Dg Chest Port 1 View  11/25/2013   CLINICAL DATA:  Followup pulmonary edema  EXAM: PORTABLE CHEST - 1 VIEW  COMPARISON:  11/24/2013  FINDINGS: Bilateral effusions with perihilar and more confluent lung base opacity is stable from the prior exam. Cardiac silhouette is mildly enlarged. No pneumothorax.  IMPRESSION: No significant change from the  previous day's study. Findings consistent with pulmonary edema with bilateral effusions.   Electronically Signed   By: Lajean Manes M.D.   On: 11/25/2013 07:19   Medications: I have reviewed the patient's current medications. Scheduled Meds: . aspirin EC  81 mg Oral QPM  . darbepoetin (ARANESP) injection - NON-DIALYSIS  100 mcg Subcutaneous Once  . feeding supplement (ENSURE COMPLETE)  237 mL Oral TID BM  . ferrous sulfate  325 mg Oral BID WC  . FLUoxetine  10 mg Oral Daily  . folic acid  1 mg Oral Daily  . furosemide  40 mg Oral BID  . gabapentin  600 mg Oral TID  . heparin subcutaneous  5,000 Units Subcutaneous 3 times per day  . insulin aspart  0-9 Units Subcutaneous TID WC  . lipase/protease/amylase  1 capsule Oral TID AC  . magnesium oxide  400 mg Oral QID  . pantoprazole  40 mg Oral Daily  . predniSONE  20 mg Oral Once  . sodium bicarbonate  1,300 mg Oral BID  . thiamine  100 mg Oral Daily  . vitamin B-12  250 mcg Oral QPM   Continuous Infusions: . sodium chloride Stopped (11/26/13 0800)   PRN Meds:.acetaminophen, guaiFENesin-codeine, promethazine  Assessment/Plan: Ms. LITZY DICKER is a 58 y.o. 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   Diastolic CHF- Patient w/ ECHO from 12/2012 showed EF of 55-60%, no WMA and no reported diastolic dysfunction. ECHO from 11/24/13 shows unchanged EF, now w/ grade 2 diastolic dysfunction. Patient w/ no LE edema on exam. Repeat CXR shows congestive heart failure with edema and effusions unchanged from the prior study.  -Continue Lasix 40 mg po qd on discharge. -Restart Amlodipine when BP tolerates  Acute on CKD- Patient w/ baseline CKD stage III, had a renal cortical biopsy in 10/2013 which was suggestive of FSGS. Worsening Cr may be  reflective of new baseline and worsening renal disease. Cr trend as follows:  Lab 11/24/13 0355 11/25/13 0155 11/25/13 0826 11/25/13 1825 11/26/13 0240  CREATININE 2.16* 2.41* 2.49* 2.66* 2.67*  -Lasix as above -Renal consulted, appreciate recs -Continue NaHCO3 to 1300 mg bid -Patient to follow up w/ Dr  Webb on 12/03/13.  Acute Hypoxic Respiratory Failure- Resolved. Likely 2/2 volume overload at this time. ECHO showed grade 2 diastolic dysfunction. Patient significantly improved, SpO2 100% on 4L O2 via [place]. Turned off O2, patient continued to maintain SpO2 @ 100% on RA. -Discontinue Vancomycin + Cefepime + Levaquin today -Continue Lasix 40 mg po qd on discharge -Guafenesin-Codeine prn for cough  AG Metabolic Acidosis- HCO3 18 this AM, AG 17.  -Continue NaHCO3 1300 mg po bid -Renal outpatient follow up on 12/03/13  Hyperkalemia- Likely 2/2 worsening renal failure. Resolved. K 4.2 today.  -Continue to monitor  -Follow up BMP in outpatient clinic  Hypotension- Resolved. BP stable this AM.  -Give Prednisone 20 mg for 3 days, then 10 mg for 3 days. Patient to have AM cortisol drawn in 2-3 weeks in clinic to assess for adrenal insufficiency -Hold home Amlodipine for now. May need to restart in clinic  Hyperglycemia- Improved. 2/2 Steroids. No h/o DM type II -ISS-S w/ CBG's AC/HS  Anemia of Chronic Disease- Baseline Hb ~9.0. 7.6 this AM.  -Given Aranesp 100 mcg injection per renal prior to discharge.  -Iron panel pending; follow up at clinic visit -Continue home ferrous sulfate 325 mg bid   Alcohol Abuse- Claims she has been abstinent for 3-4 months. -CIWA  -Continue home vitamins (thiamine, B12)  -Continue Creon   Dispo: Discharge today.  The patient does have a current PCP Otho Bellows, MD) and does need an [facility] hospital follow-up appointment after discharge.  The patient does not have transportation limitations that hinder transportation to clinic appointments.  .Services  Needed at time of discharge: Y = Yes, Blank = No PT:   OT:   RN:   Equipment:   Other:     LOS: 4 days   Corky Sox, MD 11/26/2013, 10:18 AM

## 2013-11-26 NOTE — Discharge Instructions (Signed)
1. You have the following clinic appointments:  [name] [name]  On 11/28/2013 @ 9:45 AM  [place] [address] [phone]  Martin W Webb, MD  On 12/03/2013  [place] [address] [phone]  2. Please take all medications as prescribed:   Start taking LASIX (furosemide) 40 mg DAILY.   Take Prednisone 20 mg DAILY for 3 days, then 10 mg DAILY for 3 days, then STOP.  Start taking SODIUM BICARBONATE 1300 mg TWICE DAILY.  DO NOT take Norvasc (Amlodipine) at least until seen in the clinic.   DO NOT TAKE POTASSIUM SUPPLEMENT ANY LONGER.  Take Robitussin AC as instructed, if needed for cough.   3. If you have worsening of your symptoms or new symptoms arise, please call the clinic ([phone]), or go to the ER immediately if symptoms are severe.    Chronic Kidney Disease Chronic kidney disease occurs when the kidneys are damaged over a long period. The kidneys are two organs that lie on either side of the spine between the middle of the back and the front of the abdomen. The kidneys:   Remove wastes and extra water from the blood.   Produce important hormones. These help keep bones strong, regulate blood pressure, and help create red blood cells.   Balance the fluids and chemicals in the blood and tissues. A small amount of kidney damage may not cause problems, but a large amount of damage may make it difficult or impossible for the kidneys to work the way they should. If steps are not taken to slow down the kidney damage or stop it from getting worse, the kidneys may stop working permanently. Most of the time, chronic kidney disease does not go away. However, it can often be controlled, and those with the disease can usually live normal lives. CAUSES  The most common causes of chronic kidney disease are diabetes and high blood pressure (hypertension). Chronic kidney disease may also be caused by:   Diseases that cause kidneys' filters to become inflamed.    Diseases that affect the immune system.   Genetic diseases.   Medicines that damage the kidneys, such as anti-inflammatory medicines.  Poisoning or exposure to toxic substances.   A reoccurring kidney or urinary infection.   A problem with urine flow. This may be caused by:   Cancer.   Kidney stones.   An enlarged prostate in males. SYMPTOMS  Because the kidney damage in chronic kidney disease occurs slowly, symptoms develop slowly and may not be obvious until the kidney damage becomes severe. A person may have a kidney disease for years without showing any symptoms. Symptoms can include:   Swelling (edema) of the legs, ankles, or feet.   Tiredness (lethargy).   Nausea or vomiting.   Confusion.   Problems with urination, such as:   Decreased urine production.   Frequent urination, especially at night.   Frequent accidents in children who are potty trained.   Muscle twitches and cramps.   Shortness of breath.  Weakness.   Persistent itchiness.   Loss of appetite.  Metallic taste in the mouth.  Trouble sleeping.  Slowed development in children.  Short stature in children. DIAGNOSIS  Chronic kidney disease may be detected and diagnosed by tests, including blood, urine, imaging, or kidney biopsy tests.  TREATMENT  Most chronic kidney diseases cannot be cured. Treatment usually involves relieving symptoms and preventing or slowing the progression of the disease. Treatment may include:   A  special diet. You may need to avoid alcohol and foods thatare salty and high in potassium.   Medicines. These may:   Lower blood pressure.   Relieve anemia.   Relieve swelling.   Protect the bones. HOME CARE INSTRUCTIONS   Follow your prescribed diet.   Only take over-the-counter or prescription medicines as directed by your caregiver.  Do not take any new medicines (prescription, over-the-counter, or nutritional supplements)  unless approved by your caregiver. Many medicines can worsen your kidney damage or need to have the dose adjusted.   Quit smoking if you are a smoker. Talk to your caregiver about a smoking cessation program.   Keep all follow-up appointments as directed by your caregiver. SEEK IMMEDIATE MEDICAL CARE IF:  Your symptoms get worse or you develop new symptoms.   You develop symptoms of end-stage kidney disease. These include:   Headaches.   Abnormally dark or light skin.   Numbness in the hands or feet.   Easy bruising.   Frequent hiccups.   Menstruation stops.   You have a fever.   You have decreased urine production.   You havepain or bleeding when urinating. MAKE SURE YOU:  Understand these instructions.  Will watch your condition.  Will get help right away if you are not doing well or get worse. FOR MORE INFORMATION  American Association of Kidney Patients: BombTimer.gl National Kidney Foundation: www.kidney.[place]: https://mathis.com/ [facility]: www.lifeoptions.org and www.kidneyschool.org Document Released: 02/29/2008 Document Revised: 05/08/2012 Document Reviewed: 01/19/2012 [facility] Patient Information 2015 [place], [place]. This information is not intended to replace advice given to you by your health care provider. Make sure you discuss any questions you have with your health care provider.   Heart Failure Heart failure is a condition in which the heart has trouble pumping blood. This means your heart does not pump blood efficiently for your body to work well. In some cases of heart failure, fluid may back up into your lungs or you may have swelling (edema) in your lower legs. Heart failure is usually a long-term (chronic) condition. It is important for you to take good care of yourself and follow your caregiver's treatment plan. CAUSES  Some health conditions can cause heart failure. Those health conditions  include:  High blood pressure (hypertension) causes the heart muscle to work harder than normal. When pressure in the blood vessels is high, the heart needs to pump (contract) with more force in order to circulate blood throughout the body. High blood pressure eventually causes the heart to become stiff and weak.  Coronary artery disease (CAD) is the buildup of cholesterol and fat (plaque) in the arteries of the heart. The blockage in the arteries deprives the heart muscle of oxygen and blood. This can cause chest pain and may lead to a heart attack. High blood pressure can also contribute to CAD.  Heart attack (myocardial infarction) occurs when 1 or more arteries in the heart become blocked. The loss of oxygen damages the muscle tissue of the heart. When this happens, part of the heart muscle dies. The injured tissue does not contract as well and weakens the heart's ability to pump blood.  Abnormal heart valves can cause heart failure when the heart valves do not open and close properly. This makes the heart muscle pump harder to keep the blood flowing.  Heart muscle disease (cardiomyopathy or myocarditis) is damage to the heart muscle from a variety of causes. These can include drug or alcohol abuse, infections,  or unknown reasons. These can increase the risk of heart failure.  Lung disease makes the heart work harder because the lungs do not work properly. This can cause a strain on the heart, leading it to fail.  Diabetes increases the risk of heart failure. High blood sugar contributes to high fat (lipid) levels in the blood. Diabetes can also cause slow damage to tiny blood vessels that carry important nutrients to the heart muscle. When the heart does not get enough oxygen and food, it can cause the heart to become weak and stiff. This leads to a heart that does not contract efficiently.  Other conditions can contribute to heart failure. These include abnormal heart rhythms, thyroid problems,  and low blood counts (anemia). Certain unhealthy behaviors can increase the risk of heart failure. Those unhealthy behaviors include:  Being overweight.  Smoking or chewing tobacco.  Eating foods high in fat and cholesterol.  Abusing illicit drugs or alcohol.  Lacking physical activity. SYMPTOMS  Heart failure symptoms may vary and can be hard to detect. Symptoms may include:  Shortness of breath with activity, such as climbing stairs.  Persistent cough.  Swelling of the feet, ankles, legs, or abdomen.  Unexplained weight gain.  Difficulty breathing when lying flat (orthopnea).  Waking from sleep because of the need to sit up and get more air.  Rapid heartbeat.  Fatigue and loss of energy.  Feeling lightheaded, dizzy, or close to fainting.  Loss of appetite.  Nausea.  Increased urination during the night (nocturia). DIAGNOSIS  A diagnosis of heart failure is based on your history, symptoms, physical examination, and diagnostic tests. Diagnostic tests for heart failure may include:  Echocardiography.  Electrocardiography.  Chest X-ray.  Blood tests.  Exercise stress test.  Cardiac angiography.  Radionuclide scans. TREATMENT  Treatment is aimed at managing the symptoms of heart failure. Medicines, behavioral changes, or surgical intervention may be necessary to treat heart failure.  Medicines to help treat heart failure may include:  Angiotensin-converting enzyme (ACE) inhibitors. This type of medicine blocks the effects of a blood protein called angiotensin-converting enzyme. ACE inhibitors relax (dilate) the blood vessels and help lower blood pressure.  Angiotensin receptor blockers. This type of medicine blocks the actions of a blood protein called angiotensin. Angiotensin receptor blockers dilate the blood vessels and help lower blood pressure.  Water pills (diuretics). Diuretics cause the kidneys to remove salt and water from the blood. The extra  fluid is removed through urination. This loss of extra fluid lowers the volume of blood the heart pumps.  Beta blockers. These prevent the heart from beating too fast and improve heart muscle strength.  Digitalis. This increases the force of the heartbeat.  Healthy behavior changes include:  Obtaining and maintaining a healthy weight.  Stopping smoking or chewing tobacco.  Eating heart healthy foods.  Limiting or avoiding alcohol.  Stopping illicit drug use.  Physical activity as directed by your caregiver.  Surgical treatment for heart failure may include:  A procedure to open blocked arteries, repair damaged heart valves, or remove damaged heart muscle tissue.  A pacemaker to improve heart muscle function and control certain abnormal heart rhythms.  An internal cardioverter defibrillator to treat certain serious abnormal heart rhythms.  A left ventricular assist device to assist the pumping ability of the heart. HOME CARE INSTRUCTIONS   Take your medicine as directed by your caregiver. Medicines are important in reducing the workload of your heart, slowing the progression of heart failure, and improving your  symptoms.  Do not stop taking your medicine unless directed by your caregiver.  Do not skip any dose of medicine.  Refill your prescriptions before you run out of medicine. Your medicines are needed every day.  Take over-the-counter medicine only as directed by your caregiver or pharmacist.  Engage in moderate physical activity if directed by your caregiver. Moderate physical activity can benefit some people. The elderly and people with severe heart failure should consult with a caregiver for physical activity recommendations.  Eat heart healthy foods. Food choices should be free of trans fat and low in saturated fat, cholesterol, and salt (sodium). Healthy choices include fresh or frozen fruits and vegetables, fish, lean meats, legumes, fat-free or low-fat dairy  products, and whole grain or high fiber foods. Talk to a dietitian to learn more about heart healthy foods.  Limit sodium if directed by your caregiver. Sodium restriction may reduce symptoms of heart failure in some people. Talk to a dietitian to learn more about heart healthy seasonings.  Use healthy cooking methods. Healthy cooking methods include roasting, grilling, broiling, baking, poaching, steaming, or stir-frying. Talk to a dietitian to learn more about healthy cooking methods.  Limit fluids if directed by your caregiver. Fluid restriction may reduce symptoms of heart failure in some people.  Weigh yourself every day. Daily weights are important in the early recognition of excess fluid. You should weigh yourself every morning after you urinate and before you eat breakfast. Wear the same amount of clothing each time you weigh yourself. Record your daily weight. Provide your caregiver with your weight record.  Monitor and record your blood pressure if directed by your caregiver.  Check your pulse if directed by your caregiver.  Lose weight if directed by your caregiver. Weight loss may reduce symptoms of heart failure in some people.  Stop smoking or chewing tobacco. Nicotine makes your heart work harder by causing your blood vessels to constrict. Do not use nicotine gum or patches before talking to your caregiver.  Schedule and attend follow-up visits as directed by your caregiver. It is important to keep all your appointments.  Limit alcohol intake to no more than 1 drink per day for nonpregnant women and 2 drinks per day for men. Drinking more than that is harmful to your heart. Tell your caregiver if you drink alcohol several times a week. Talk with your caregiver about whether alcohol is safe for you. If your heart has already been damaged by alcohol or you have severe heart failure, drinking alcohol should be stopped completely.  Stop illicit drug use.  Stay up-to-date with  immunizations. It is especially important to prevent respiratory infections through current pneumococcal and influenza immunizations.  Manage other health conditions such as hypertension, diabetes, thyroid disease, or abnormal heart rhythms as directed by your caregiver.  Learn to manage stress.  Plan rest periods when fatigued.  Learn strategies to manage high temperatures. If the weather is extremely hot:  Avoid vigorous physical activity.  Use air conditioning or fans or seek a cooler location.  Avoid caffeine and alcohol.  Wear loose-fitting, lightweight, and light-colored clothing.  Learn strategies to manage cold temperatures. If the weather is extremely cold:  Avoid vigorous physical activity.  Layer clothes.  Wear mittens or gloves, a hat, and a scarf when going outside.  Avoid alcohol.  Obtain ongoing education and support as needed.  Participate or seek rehabilitation as needed to maintain or improve independence and quality of life. SEEK MEDICAL CARE IF:  Your weight increases by 03 lb/1.4 kg in 1 day or 05 lb/2.3 kg in a week.  You have increasing shortness of breath that is unusual for you.  You are unable to participate in your usual physical activities.  You tire easily.  You cough more than normal, especially with physical activity.  You have any or more swelling in areas such as your hands, feet, ankles, or abdomen.  You are unable to sleep because it is hard to breathe.  You feel like your heart is beating fast (palpitations).  You become dizzy or lightheaded upon standing up. SEEK IMMEDIATE MEDICAL CARE IF:   You have difficulty breathing.  There is a change in mental status such as decreased alertness or difficulty with concentration.  You have a pain or discomfort in your chest.  You have an episode of fainting (syncope). MAKE SURE YOU:   Understand these instructions.  Will watch your condition.  Will get help right away if you  are not doing well or get worse. Document Released: 05/22/2005 Document Revised: 09/16/2012 Document Reviewed: 06/13/2012 [facility] Patient Information 2015 [place], [place]. This information is not intended to replace advice given to you by your health care provider. Make sure you discuss any questions you have with your health care provider.

## 2013-11-26 NOTE — Discharge Summary (Signed)
Name: [name] [name] MRN: [id number] DOB: 1956-02-11 58 y.o. PCP: Otho Bellows, MD  Date of Admission: 11/22/2013  9:43 AM Date of Discharge: 11/26/2013 Attending Physician: Sid Falcon, MD  Discharge Diagnosis: 1. Acute Hypoxic Respiratory Failure 2/2 Pulmonary Edema 2. Acute on Chronic dCHF 3. Acute on CKD 4. Hypotension 5. Acute Viral Bronchitis 6. Anemia of Chronic Disease   Discharge Medications:   Medication List    STOP taking these medications       amLODipine 10 MG tablet  Commonly known as:  NORVASC     meclizine 50 MG tablet  Commonly known as:  ANTIVERT     potassium chloride SA 20 MEQ tablet  Commonly known as:  KLOR-CON M20      TAKE these medications       aspirin EC 81 MG tablet  Take 1 tablet (81 mg total) by mouth every evening.     calcium carbonate 500 MG chewable tablet  Commonly known as:  TUMS  Chew 6 tablets (1,200 mg of elemental calcium total) by mouth 3 (three) times daily. For bone health     cetirizine 10 MG tablet  Commonly known as:  ZYRTEC  Take 1 tablet (10 mg total) by mouth daily.     diclofenac sodium 1 % Gel  Commonly known as:  VOLTAREN  Apply 2 g topically 2 (two) times daily. To affected area     feeding supplement (ENSURE COMPLETE) Liqd  Take 237 mLs by mouth 3 (three) times daily between meals.     ferrous sulfate 325 (65 FE) MG tablet  Take 1 tablet (325 mg total) by mouth 2 (two) times daily with a meal.     FLUoxetine 10 MG capsule  Commonly known as:  PROZAC  Take 1 capsule (10 mg total) by mouth daily. For depression     folic acid 1 MG tablet  Commonly known as:  FOLVITE  Take 1 tablet (1 mg total) by mouth daily. For folic acid replacement     furosemide 40 MG tablet  Commonly known as:  LASIX  Take 1 tablet (40 mg total) by mouth daily.     gabapentin 300 MG capsule  Commonly known as:  NEURONTIN  Take 2 capsules (600 mg total) by mouth 3 (three) times daily. For anxiety/pain control     guaiFENesin-codeine 100-10 MG/5ML syrup  Take 5 mLs by mouth every 4 (four) hours as needed for cough.     lipase/protease/amylase 12000 UNITS Cpep capsule  Commonly known as:  CREON-12/PANCREASE  Take 1 capsule by mouth 3 (three) times daily before meals.     magnesium oxide 400 MG tablet  Commonly known as:  MAG-OX  Take 1 tablet (400 mg total) by mouth 4 (four) times daily.     multivitamin with minerals Tabs tablet  Take 1 tablet by mouth daily. For vitamin replacement     omeprazole 40 MG capsule  Commonly known as:  PRILOSEC  Take 1 capsule (40 mg total) by mouth daily.     predniSONE 20 MG tablet  Commonly known as:  DELTASONE  Take 1 tablet (20 mg total) by mouth daily with breakfast.     predniSONE 10 MG tablet  Commonly known as:  DELTASONE  Take 1 tablet (10 mg total) by mouth daily with breakfast.     sodium bicarbonate 650 MG tablet  Take 2 tablets (1,300 mg total) by mouth 2 (two) times daily.     thiamine 100  MG tablet  Commonly known as:  VITAMIN B-1  Take 1 tablet (100 mg total) by mouth daily. For low thiamine     vitamin B-12 250 MCG tablet  Commonly known as:  CYANOCOBALAMIN  Take 1 tablet (250 mcg total) by mouth every evening.     Vitamin D (Ergocalciferol) 50000 UNITS Caps capsule  Commonly known as:  DRISDOL  Take 1 capsule (50,000 Units total) by mouth every 7 (seven) days. Thursdays: For bone health        Disposition and follow-up:   [name] [name] was discharged from [facility] in Stable condition.  At the hospital follow up visit please address:  1.  Pulmonary Edema; has breathing improved? Still w/ cough, SOB, chest pain? Has patient been compliant w/ Lasix?  CKD; Patient to follow up w/ Dr Justin Mend on 12/03/13. Has patient continued NaHCO3 1300 mg bid? Has patient had adequate urine output on Lasix? Repeat BMP + Magnesium.   Anemia; Most likely 2/2 CKD, however, patient on ferrous sulfate at home. Repeat iron studies  pending.   BP; Has BP recovered? Is patient still taking Prednisone? Consider repeating AM Cortisol in 4-6 weeks.   2.  Labs / imaging needed at time of follow-up: CBC, BMP, magnesium. Consider repeat CXR if still w/ SOB.   3.  Pending labs/ test needing follow-up: Iron panel, ferritin  Follow-up Appointments:     Follow-up Information   Follow up with Otho Bellows, MD On 11/28/2013. (9:45 AM)    Specialty:  Internal Medicine   Contact information:   [place] [id number] [phone]       Discharge Instructions:   Consultations: Treatment Team:  Louis Meckel, MD  Procedures Performed:  Dg Chest 2 View  11/22/2013   CLINICAL DATA:  Two week history of cough  EXAM: CHEST  2 VIEW  COMPARISON:  Prior chest x-ray 09/25/2013  FINDINGS: Stable cardiac and mediastinal contours. Interval resolution of right lower lobe infiltrate compared to 09/25/2013. No new airspace consolidation. No pulmonary edema, pleural effusion or pneumothorax. No suspicious pulmonary nodule or mass. Stable background bronchitic changes. No acute osseous abnormality.  IMPRESSION: No active cardiopulmonary disease.  Interval resolution of right lower lobe infiltrate compared to 09/25/2013.   Electronically Signed   By: Jacqulynn Cadet M.D.   On: 11/22/2013 10:29   US Biopsy  10/29/2013   CLINICAL DATA:  Proteinuria.  Renal failure.  EXAM: ULTRASOUND-GUIDED RANDOM RENAL CORTEX BIOPSY.  MEDICATIONS AND MEDICAL HISTORY: Versed 2 mg, Fentanyl 75 mcg.  Additional Medications: None.  ANESTHESIA/SEDATION: Moderate sedation time: 10 minutes  PROCEDURE: The procedure, risks, benefits, and alternatives were explained to the patient. Questions regarding the procedure were encouraged and answered. The patient understands and consents to the procedure.  The back was prepped with Betadine in a sterile fashion, and a sterile drape was applied covering the operative field. A sterile gown and sterile gloves  were used for the procedure.  Under sonographic guidance, 2 16 gauge core biopsies of the left lower pole renal cortex were obtained. Final imaging was performed.  Patient tolerated the procedure well without complication. Vital sign monitoring by nursing staff during the procedure will continue as patient is in the special procedures unit for post procedure observation.  FINDINGS: The images document guide needle placement within the left lower pole renal cortex. Post biopsy images demonstrate no hemorrhage.  IMPRESSION: Successful ultrasound-guided left renal cortex core biopsy.   Electronically Signed  By: Maryclare Bean M.D.   On: 10/29/2013 12:59   Dg Chest Port 1 View  11/26/2013   CLINICAL DATA:  Pulmonary  EXAM: PORTABLE CHEST - 1 VIEW  COMPARISON:  11/25/2013  FINDINGS: Cardiac enlargement. Pulmonary vascular congestion and bilateral edema is unchanged. Bilateral effusion and bibasilar atelectasis also unchanged.  IMPRESSION: Congestive heart failure with edema and effusions unchanged from the prior study.   Electronically Signed   By: Franchot Gallo M.D.   On: 11/26/2013 07:34   Dg Chest Port 1 View  11/25/2013   CLINICAL DATA:  Followup pulmonary edema  EXAM: PORTABLE CHEST - 1 VIEW  COMPARISON:  11/24/2013  FINDINGS: Bilateral effusions with perihilar and more confluent lung base opacity is stable from the prior exam. Cardiac silhouette is mildly enlarged. No pneumothorax.  IMPRESSION: No significant change from the previous day's study. Findings consistent with pulmonary edema with bilateral effusions.   Electronically Signed   By: Lajean Manes M.D.   On: 11/25/2013 07:19   Dg Chest Port 1 View  11/24/2013   CLINICAL DATA:  Chest pain, shortness of breath.  EXAM: PORTABLE CHEST - 1 VIEW  COMPARISON:  November 22, 2013.  FINDINGS: Mild cardiomegaly is noted. Bilateral perihilar and basilar opacities are noted most consistent with pulmonary edema with mild associated pleural effusions. No pneumothorax  is noted. Bony thorax is intact.  IMPRESSION: Bilateral perihilar and basilar opacities are noted most consistent with pulmonary edema. Mild bilateral pleural effusions are noted.   Electronically Signed   By: Sabino Dick M.D.   On: 11/24/2013 08:29    Admission HPI: [name] [name] is a 58 y.o. 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 The patient claims she has had a cough for 5-6 days, productive of a white "foam", but no significant sputum production. She denies fever or chills at home. She claims she has had a significant enough cough w/ vomiting that she is unable to keep much down, therefore has not had much to eat in the past 2-3 days and has not taken her home medications over the same period of time. She denies significant SOB, or chest pain, no sore throat, abdominal pain, dysuria, flank pain, diarrhea, LE swelling, dizziness, lightheadedness, or palpitations. 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  Today, the patient denies recent alcohol use, saying she has not been drinking for the past 3-4 months. Last night she claims she had a small sip of wine to help her sleep, but says she could not keep it down because of her coughing and post-tussive vomiting.  On exam, the patient appears comfortable, not coughing. In the ED, found to have mild hypokalemia, AKI, and worsened AG acidosis. CXR shows no active cardiopulmonary disease.   Hospital Course by problem list:   1. Acute Hypoxic Respiratory Failure 2/2 Pulmonary Edema- Patient admitted w/ acute bronchitis, however, in the AM of 11/24/13, patient became hypotensive,  60's/40's and tachycardic to 120's. Patient described chest tightness and SOB and feeling generally unwell. Given 2L bolus w/ mild improvement in BP, however, patient had decreased SpO2 to 80's. Started on 6L O2 via [place] 2L improvement in SpO2. CXR suggestive  of pulmonary edema, lactic acid 3.4. Leukocytosis of 12.7. Given dose of Vancomycin IV, Cefepime IV and started on Heparin gtt for concern of PE. On exam, patient w/ bilateral rales but decreased air entry on the RLL. Concern for volume overload in the setting of recent worsening of renal function. Previous ECHO from 12/2012 showed EF 60-65% w/ no WMA, no diastolic dysfunction. Troponin negative x1. EKG w/ no dynamic changes. ABG on 6L O2 w/ pH 7.28, PCO2 29, pO2 60, HCO3 13.3, SpO2 88%. Patient w/ improved tachycardia and decreased SOB, discontinued heparin gtt given low concern for PE. Repeat ECHO on 11/24/13 showed grade 2 diastolic dysfunction. Given Lasix IV 40 x 2 and continued on Lasix 40 mg po qd w/ good diuresis. Able to breath well off of [place] prior to discharge w/ SpO2 >95%. IV ABx discontinued given more likely diagnosis of pulmonary edema vs pneumonia.   2. Acute on Chronic dCHF- Patient w/ ECHO from 12/2012 showed EF of 55-60%, no WMA and no reported diastolic dysfunction. ECHO from 11/24/13 shows unchanged EF, now w/ grade 2 diastolic dysfunction. Patient w/ no LE edema on exam. Repeat CXR showed congestive heart failure with edema and effusions unchanged from the prior study. Given Lasix IV 40 x 2 and continued on Lasix 40 mg po qd w/ adequate diuresis.   3. Acute on CKD- Patient w/ baseline CKD stage III, had a renal cortical biopsy in 10/2013 which was suggestive of FSGS. AKI thought to be 2/2 pre-renal etiology (decreased po intake). Started on LR @ 125 cc/hr w/out improvement in Cr. Likely now d/t worsening renal disease. Most recent Cr trend as follows:   Recent Labs Lab 11/25/13 0155 11/25/13 0826 11/25/13 1825 11/26/13 0240  11/28/13 1112  CREATININE 2.41* 2.49* 2.66* 2.67* 2.56*  Patient also w/ chronic AG metabolic acidosis, started on NaHCO3 650 mg po bid, increased to 1300 mg po bid according to nephrology recommendations. Also started on Lasix as above given volume overload. To follow up w/ Dr. Justin Mend on 12/03/13.   4. Hyperkalemia- Initially hypokalemic on admission, given supplementation. Also continued home po supplementation, 20 mEq daily. On 11/24/13, discontinued supplementation b/c of increasing K. Patient found to be hyperkalemic to 6.2 on 11/25/13, most likely contributed to by worsening renal failure. No peaked T waves, prolonged PR, or prolonged QRS present. Given Kayexalate 15 mg once + Lasix 40 mg IV + 1 amp D50 + Novolog 10 units IV, decreased to 5.6. Continued Lasix as discussed above w/ adequate decrease in K. Held home supplementation on discharge.   5. Hypotension- Patient noted to have BP of 50's/40's on the AM of 11/24/13. Gave 2L NS w/ mild improvement initially (see pulmonary edema section above), but then continued to be hypotensive. Possibly etiology associated w/ adrenal insufficiency vs pneumonia. Given Solu-Cortef 100 mg IV, then continued on 50 mg IV q6h, then q12h. BP recovered adequately. Discharged w/ Prednisone 20 mg qd for 3 days, the, 10 mg qd for 3 days.   6. Acute Viral Bronchitis- Patient presented w/ cough, and progressed to worsened SOB as discussed at length above. Given Guafenesin-codeine 5 mL q4h prn for cough and Phenergan prn for vomiting. Improved cough prior to discharge.    7. Anemia of Chronic Disease- Baseline Hb ~9.0, most likely 2/2 CKD. Continued home Ferrous Sulfate 325 mg bid. Prior to discharge, seen by nephrology, gave Aranesp 100 mcg IM injection. Iron panel ordered.   Discharge Vitals:   BP 131/79  Pulse 91  Temp(Src) 98.6 F (37 C) (Oral)  Resp 12  Ht 5\' 1"  (1.549 m)  Wt 100 lb 14.4 oz (45.768 kg)  BMI 19.07 kg/m2  SpO2 100%  Discharge Labs:  Results for  orders placed during the hospital encounter of 11/22/13 (from the past 24 hour(s))  GLUCOSE, CAPILLARY     Status: Abnormal   Collection Time    11/25/13  2:01 PM      Result Value Ref Range   Glucose-Capillary 271 (*) 70 - 99 mg/dL  GLUCOSE, CAPILLARY     Status: Abnormal   Collection Time    11/25/13  4:05 PM      Result Value Ref Range   Glucose-Capillary 294 (*) 70 - 99 mg/dL  BASIC METABOLIC PANEL     Status: Abnormal   Collection Time    11/25/13  6:25 PM      Result Value Ref Range   Sodium 137  137 - 147 mEq/L   Potassium 4.2  3.7 - 5.3 mEq/L   Chloride 104  96 - 112 mEq/L   CO2 17 (*) 19 - 32 mEq/L   Glucose, Bld 294 (*) 70 - 99 mg/dL   BUN 39 (*) 6 - 23 mg/dL   Creatinine, Ser 2.66 (*) 0.50 - 1.10 mg/dL   Calcium 8.6  8.4 - 10.5 mg/dL   GFR calc non Af Amer 19 (*) >90 mL/min   GFR calc Af Amer 22 (*) >90 mL/min  GLUCOSE, CAPILLARY     Status: Abnormal   Collection Time    11/25/13  7:35 PM      Result Value Ref Range   Glucose-Capillary 335 (*) 70 - 99 mg/dL  RENAL FUNCTION PANEL     Status: Abnormal   Collection Time    11/26/13  2:40 AM      Result Value Ref Range   Sodium 137  137 - 147 mEq/L   Potassium 4.2  3.7 - 5.3 mEq/L   Chloride 102  96 - 112 mEq/L   CO2 18 (*) 19 - 32 mEq/L   Glucose, Bld 142 (*) 70 - 99 mg/dL   BUN 39 (*) 6 - 23 mg/dL   Creatinine, Ser 2.67 (*) 0.50 - 1.10 mg/dL   Calcium 8.6  8.4 - 10.5 mg/dL   Phosphorus 3.1  2.3 - 4.6 mg/dL   Albumin 1.9 (*) 3.5 - 5.2 g/dL   GFR calc non Af Amer 19 (*) >90 mL/min   GFR calc Af Amer 22 (*) >90 mL/min  CBC     Status: Abnormal   Collection Time    11/26/13  2:40 AM      Result Value Ref Range   WBC 25.0 (*) 4.0 - 10.5 K/uL   RBC 2.33 (*) 3.87 - 5.11 MIL/uL   Hemoglobin 7.6 (*) 12.0 - 15.0 g/dL   HCT 22.5 (*) 36.0 - 46.0 %   MCV 96.6  78.0 - 100.0 fL   MCH 32.6  26.0 - 34.0 pg   MCHC 33.8  30.0 - 36.0 g/dL   RDW 14.5  11.5 - 15.5 %   Platelets 180  150 - 400 K/uL  GLUCOSE, CAPILLARY      Status: Abnormal   Collection Time    11/26/13  8:12 AM      Result Value Ref Range   Glucose-Capillary 114 (*) 70 - 99 mg/dL    Signed: Corky Sox, MD 11/26/2013, 10:20 AM    Services Ordered on Discharge: none Equipment  Ordered on Discharge: none

## 2013-11-28 ENCOUNTER — Inpatient Hospital Stay (HOSPITAL_COMMUNITY): Payer: PRIVATE HEALTH INSURANCE

## 2013-11-28 ENCOUNTER — Encounter (HOSPITAL_COMMUNITY): Payer: Self-pay | Admitting: Internal Medicine

## 2013-11-28 ENCOUNTER — Inpatient Hospital Stay (HOSPITAL_COMMUNITY)
Admission: AD | Admit: 2013-11-28 | Discharge: 2013-12-09 | DRG: 237 | Disposition: A | Discharge status: Home Health | Payer: PRIVATE HEALTH INSURANCE | Source: Ambulatory Visit | Attending: Oncology | Admitting: Oncology

## 2013-11-28 ENCOUNTER — Ambulatory Visit (HOSPITAL_COMMUNITY)
Admission: RE | Admit: 2013-11-28 | Discharge: 2013-11-28 | Disposition: A | Discharge status: Home / Self Care | Payer: PRIVATE HEALTH INSURANCE | Source: Ambulatory Visit | Attending: Internal Medicine | Admitting: Internal Medicine

## 2013-11-28 ENCOUNTER — Encounter: Payer: Self-pay | Admitting: Internal Medicine

## 2013-11-28 ENCOUNTER — Ambulatory Visit (INDEPENDENT_AMBULATORY_CARE_PROVIDER_SITE_OTHER): Payer: PRIVATE HEALTH INSURANCE | Admitting: Internal Medicine

## 2013-11-28 VITALS — BP 141/91 | HR 111 | Temp 98.8°F | Ht 61.0 in | Wt 98.8 lb

## 2013-11-28 DIAGNOSIS — E8809 Other disorders of plasma-protein metabolism, not elsewhere classified: Secondary | ICD-10-CM | POA: Diagnosis present

## 2013-11-28 DIAGNOSIS — R7309 Other abnormal glucose: Secondary | ICD-10-CM | POA: Diagnosis not present

## 2013-11-28 DIAGNOSIS — R112 Nausea with vomiting, unspecified: Secondary | ICD-10-CM | POA: Diagnosis not present

## 2013-11-28 DIAGNOSIS — J40 Bronchitis, not specified as acute or chronic: Secondary | ICD-10-CM | POA: Diagnosis present

## 2013-11-28 DIAGNOSIS — I313 Pericardial effusion (noninflammatory): Secondary | ICD-10-CM

## 2013-11-28 DIAGNOSIS — F3289 Other specified depressive episodes: Secondary | ICD-10-CM | POA: Diagnosis present

## 2013-11-28 DIAGNOSIS — Z87891 Personal history of nicotine dependence: Secondary | ICD-10-CM | POA: Diagnosis not present

## 2013-11-28 DIAGNOSIS — D72829 Elevated white blood cell count, unspecified: Secondary | ICD-10-CM | POA: Diagnosis present

## 2013-11-28 DIAGNOSIS — I309 Acute pericarditis, unspecified: Secondary | ICD-10-CM | POA: Insufficient documentation

## 2013-11-28 DIAGNOSIS — IMO0002 Reserved for concepts with insufficient information to code with codable children: Secondary | ICD-10-CM | POA: Diagnosis not present

## 2013-11-28 DIAGNOSIS — N032 Chronic nephritic syndrome with diffuse membranous glomerulonephritis: Secondary | ICD-10-CM | POA: Diagnosis present

## 2013-11-28 DIAGNOSIS — K219 Gastro-esophageal reflux disease without esophagitis: Secondary | ICD-10-CM | POA: Diagnosis present

## 2013-11-28 DIAGNOSIS — R0609 Other forms of dyspnea: Secondary | ICD-10-CM | POA: Diagnosis present

## 2013-11-28 DIAGNOSIS — Z7982 Long term (current) use of aspirin: Secondary | ICD-10-CM | POA: Diagnosis not present

## 2013-11-28 DIAGNOSIS — E43 Unspecified severe protein-calorie malnutrition: Secondary | ICD-10-CM | POA: Diagnosis present

## 2013-11-28 DIAGNOSIS — D696 Thrombocytopenia, unspecified: Secondary | ICD-10-CM | POA: Diagnosis present

## 2013-11-28 DIAGNOSIS — N179 Acute kidney failure, unspecified: Secondary | ICD-10-CM

## 2013-11-28 DIAGNOSIS — F329 Major depressive disorder, single episode, unspecified: Secondary | ICD-10-CM | POA: Diagnosis present

## 2013-11-28 DIAGNOSIS — I3139 Other pericardial effusion (noninflammatory): Secondary | ICD-10-CM

## 2013-11-28 DIAGNOSIS — N183 Chronic kidney disease, stage 3 unspecified: Secondary | ICD-10-CM

## 2013-11-28 DIAGNOSIS — R0989 Other specified symptoms and signs involving the circulatory and respiratory systems: Secondary | ICD-10-CM | POA: Diagnosis present

## 2013-11-28 DIAGNOSIS — M25512 Pain in left shoulder: Secondary | ICD-10-CM | POA: Diagnosis present

## 2013-11-28 DIAGNOSIS — E875 Hyperkalemia: Secondary | ICD-10-CM

## 2013-11-28 DIAGNOSIS — N189 Chronic kidney disease, unspecified: Principal | ICD-10-CM

## 2013-11-28 DIAGNOSIS — Z681 Body mass index (BMI) 19 or less, adult: Secondary | ICD-10-CM

## 2013-11-28 DIAGNOSIS — I5033 Acute on chronic diastolic (congestive) heart failure: Secondary | ICD-10-CM | POA: Diagnosis present

## 2013-11-28 DIAGNOSIS — E872 Acidosis, unspecified: Secondary | ICD-10-CM | POA: Diagnosis present

## 2013-11-28 DIAGNOSIS — R739 Hyperglycemia, unspecified: Secondary | ICD-10-CM

## 2013-11-28 DIAGNOSIS — M25519 Pain in unspecified shoulder: Secondary | ICD-10-CM

## 2013-11-28 DIAGNOSIS — J9819 Other pulmonary collapse: Secondary | ICD-10-CM | POA: Diagnosis not present

## 2013-11-28 DIAGNOSIS — E538 Deficiency of other specified B group vitamins: Secondary | ICD-10-CM | POA: Diagnosis present

## 2013-11-28 DIAGNOSIS — F1210 Cannabis abuse, uncomplicated: Secondary | ICD-10-CM | POA: Diagnosis present

## 2013-11-28 DIAGNOSIS — N051 Unspecified nephritic syndrome with focal and segmental glomerular lesions: Secondary | ICD-10-CM | POA: Diagnosis present

## 2013-11-28 DIAGNOSIS — E781 Pure hyperglyceridemia: Secondary | ICD-10-CM | POA: Diagnosis present

## 2013-11-28 DIAGNOSIS — I129 Hypertensive chronic kidney disease with stage 1 through stage 4 chronic kidney disease, or unspecified chronic kidney disease: Secondary | ICD-10-CM | POA: Diagnosis present

## 2013-11-28 DIAGNOSIS — N184 Chronic kidney disease, stage 4 (severe): Secondary | ICD-10-CM | POA: Diagnosis present

## 2013-11-28 DIAGNOSIS — D649 Anemia, unspecified: Secondary | ICD-10-CM

## 2013-11-28 DIAGNOSIS — D638 Anemia in other chronic diseases classified elsewhere: Secondary | ICD-10-CM | POA: Diagnosis present

## 2013-11-28 DIAGNOSIS — J209 Acute bronchitis, unspecified: Secondary | ICD-10-CM

## 2013-11-28 DIAGNOSIS — G40909 Epilepsy, unspecified, not intractable, without status epilepticus: Secondary | ICD-10-CM | POA: Diagnosis present

## 2013-11-28 DIAGNOSIS — F1010 Alcohol abuse, uncomplicated: Secondary | ICD-10-CM | POA: Diagnosis present

## 2013-11-28 DIAGNOSIS — N289 Disorder of kidney and ureter, unspecified: Secondary | ICD-10-CM

## 2013-11-28 DIAGNOSIS — E785 Hyperlipidemia, unspecified: Secondary | ICD-10-CM | POA: Diagnosis present

## 2013-11-28 DIAGNOSIS — E559 Vitamin D deficiency, unspecified: Secondary | ICD-10-CM | POA: Diagnosis present

## 2013-11-28 DIAGNOSIS — R000 Tachycardia, unspecified: Secondary | ICD-10-CM | POA: Diagnosis present

## 2013-11-28 DIAGNOSIS — E8770 Fluid overload, unspecified: Secondary | ICD-10-CM | POA: Diagnosis present

## 2013-11-28 DIAGNOSIS — D519 Vitamin B12 deficiency anemia, unspecified: Secondary | ICD-10-CM

## 2013-11-28 DIAGNOSIS — R64 Cachexia: Secondary | ICD-10-CM | POA: Diagnosis present

## 2013-11-28 DIAGNOSIS — I10 Essential (primary) hypertension: Secondary | ICD-10-CM

## 2013-11-28 DIAGNOSIS — R071 Chest pain on breathing: Secondary | ICD-10-CM

## 2013-11-28 DIAGNOSIS — R0781 Pleurodynia: Secondary | ICD-10-CM | POA: Insufficient documentation

## 2013-11-28 DIAGNOSIS — I5042 Chronic combined systolic (congestive) and diastolic (congestive) heart failure: Secondary | ICD-10-CM

## 2013-11-28 DIAGNOSIS — I5030 Unspecified diastolic (congestive) heart failure: Secondary | ICD-10-CM

## 2013-11-28 DIAGNOSIS — D62 Acute posthemorrhagic anemia: Secondary | ICD-10-CM | POA: Diagnosis not present

## 2013-11-28 DIAGNOSIS — E8729 Other acidosis: Secondary | ICD-10-CM

## 2013-11-28 DIAGNOSIS — I319 Disease of pericardium, unspecified: Secondary | ICD-10-CM | POA: Diagnosis present

## 2013-11-28 DIAGNOSIS — D631 Anemia in chronic kidney disease: Secondary | ICD-10-CM

## 2013-11-28 HISTORY — DX: Chronic diastolic (congestive) heart failure: I50.32

## 2013-11-28 HISTORY — DX: Chronic kidney disease, stage 3 (moderate): N18.3

## 2013-11-28 HISTORY — DX: Chronic kidney disease, stage 3 unspecified: N18.30

## 2013-11-28 LAB — TROPONIN I: Troponin I: <0.3 ng/mL (ref <0.06)

## 2013-11-28 LAB — CBC
HCT: 27 % — ABNORMAL LOW (ref 36.0–46.0)
Hemoglobin: 8.9 g/dL — ABNORMAL LOW (ref 12.0–15.0)
MCH: 32 pg (ref 26.0–34.0)
MCHC: 33 g/dL (ref 30.0–36.0)
MCV: 97.1 fL (ref 78.0–100.0)
Platelets: 236 10*3/uL (ref 150–400)
RBC: 2.78 MIL/uL — AB (ref 3.87–5.11)
RDW: 14 % (ref 11.5–15.5)
WBC: 14.2 10*3/uL — AB (ref 4.0–10.5)

## 2013-11-28 LAB — RENAL FUNCTION PANEL
Albumin: 2.3 g/dL — ABNORMAL LOW (ref 3.5–5.2)
BUN: 51 mg/dL — ABNORMAL HIGH (ref 6–23)
CALCIUM: 7.9 mg/dL — AB (ref 8.4–10.5)
CO2: 24 mEq/L (ref 19–32)
Chloride: 95 mEq/L — ABNORMAL LOW (ref 96–112)
Creat: 2.56 mg/dL — ABNORMAL HIGH (ref 0.50–1.10)
Glucose, Bld: 114 mg/dL — ABNORMAL HIGH (ref 70–99)
PHOSPHORUS: 2.2 mg/dL — AB (ref 2.3–4.6)
POTASSIUM: 4.2 meq/L (ref 3.5–5.3)
SODIUM: 137 meq/L (ref 135–145)

## 2013-11-28 LAB — POCT GLYCOSYLATED HEMOGLOBIN (HGB A1C): HEMOGLOBIN A1C: 5.4

## 2013-11-28 LAB — MAGNESIUM: MAGNESIUM: 1.1 mg/dL — AB (ref 1.5–2.5)

## 2013-11-28 LAB — PRO B NATRIURETIC PEPTIDE: PRO B NATRI PEPTIDE: 17837 pg/mL — AB (ref 0–125)

## 2013-11-28 MED ORDER — MAGNESIUM OXIDE 400 (241.3 MG) MG PO TABS
400.0000 mg | ORAL_TABLET | Freq: Three times a day (TID) | ORAL | Status: DC
  Administered 2013-11-28: 400 mg via ORAL
  Filled 2013-11-28 (×2): qty 1

## 2013-11-28 MED ORDER — VITAMIN D (ERGOCALCIFEROL) 1.25 MG (50000 UNIT) PO CAPS
50000.0000 [IU] | ORAL_CAPSULE | ORAL | Status: DC
  Filled 2013-11-28: qty 1

## 2013-11-28 MED ORDER — CYANOCOBALAMIN 250 MCG PO TABS
250.0000 ug | ORAL_TABLET | Freq: Every evening | ORAL | Status: DC
  Filled 2013-11-28: qty 1

## 2013-11-28 MED ORDER — SODIUM BICARBONATE 650 MG PO TABS
1300.0000 mg | ORAL_TABLET | Freq: Two times a day (BID) | ORAL | Status: DC
  Administered 2013-11-28 – 2013-12-01 (×6): 1300 mg via ORAL
  Filled 2013-11-28 (×7): qty 2

## 2013-11-28 MED ORDER — CALCIUM CARBONATE ANTACID 500 MG PO CHEW
6.0000 | CHEWABLE_TABLET | Freq: Three times a day (TID) | ORAL | Status: DC
  Administered 2013-11-28 – 2013-12-09 (×29): 1200 mg via ORAL
  Filled 2013-11-28 (×34): qty 6

## 2013-11-28 MED ORDER — LIDOCAINE 5 % EX PTCH
1.0000 | MEDICATED_PATCH | CUTANEOUS | Status: DC
  Administered 2013-11-28 – 2013-12-06 (×8): 1 via TRANSDERMAL
  Filled 2013-11-28 (×11): qty 1

## 2013-11-28 MED ORDER — MAGNESIUM SULFATE 4000MG/100ML IJ SOLN
4.0000 g | Freq: Once | INTRAMUSCULAR | Status: AC
  Administered 2013-11-28: 4 g via INTRAVENOUS
  Filled 2013-11-28 (×2): qty 100

## 2013-11-28 MED ORDER — HEPARIN SODIUM (PORCINE) 5000 UNIT/ML IJ SOLN
5000.0000 [IU] | Freq: Three times a day (TID) | INTRAMUSCULAR | Status: DC
  Administered 2013-11-28 – 2013-12-02 (×11): 5000 [IU] via SUBCUTANEOUS
  Filled 2013-11-28 (×20): qty 1

## 2013-11-28 MED ORDER — AMLODIPINE BESYLATE 5 MG PO TABS: 5.0000 mg | ORAL_TABLET | Freq: Every day | ORAL | Status: DC

## 2013-11-28 MED ORDER — FERROUS SULFATE 325 (65 FE) MG PO TABS
325.0000 mg | ORAL_TABLET | Freq: Two times a day (BID) | ORAL | Status: DC
  Filled 2013-11-28 (×2): qty 1

## 2013-11-28 MED ORDER — PREDNISONE 20 MG PO TABS
20.0000 mg | ORAL_TABLET | Freq: Every day | ORAL | Status: DC
  Administered 2013-11-28 – 2013-12-09 (×12): 20 mg via ORAL
  Filled 2013-11-28 (×14): qty 1

## 2013-11-28 MED ORDER — AMLODIPINE BESYLATE 5 MG PO TABS
5.0000 mg | ORAL_TABLET | Freq: Every day | ORAL | Status: DC
  Administered 2013-11-28 – 2013-12-09 (×11): 5 mg via ORAL
  Filled 2013-11-28 (×13): qty 1

## 2013-11-28 MED ORDER — COLCHICINE 0.6 MG PO TABS
0.3000 mg | ORAL_TABLET | Freq: Every day | ORAL | Status: DC
  Administered 2013-11-28 – 2013-11-30 (×3): 0.3 mg via ORAL
  Filled 2013-11-28 (×5): qty 0.5

## 2013-11-28 MED ORDER — GUAIFENESIN-CODEINE 100-10 MG/5ML PO SOLN: 5.0000 mL | ORAL | Status: DC | PRN

## 2013-11-28 MED ORDER — FUROSEMIDE 10 MG/ML IJ SOLN
40.0000 mg | Freq: Once | INTRAMUSCULAR | Status: AC
  Administered 2013-11-28: 40 mg via INTRAVENOUS
  Filled 2013-11-28: qty 4

## 2013-11-28 MED ORDER — CYANOCOBALAMIN 500 MCG PO TABS
500.0000 ug | ORAL_TABLET | Freq: Every evening | ORAL | Status: DC
  Administered 2013-11-28 – 2013-12-08 (×10): 500 ug via ORAL
  Filled 2013-11-28 (×12): qty 1

## 2013-11-28 MED ORDER — ACETAMINOPHEN 500 MG PO TABS
500.0000 mg | ORAL_TABLET | ORAL | Status: DC | PRN
  Administered 2013-11-28 – 2013-12-03 (×7): 500 mg via ORAL
  Filled 2013-11-28 (×7): qty 1

## 2013-11-28 MED ORDER — GABAPENTIN 300 MG PO CAPS
600.0000 mg | ORAL_CAPSULE | Freq: Three times a day (TID) | ORAL | Status: DC
  Administered 2013-11-28 – 2013-12-09 (×30): 600 mg via ORAL
  Filled 2013-11-28 (×34): qty 2

## 2013-11-28 MED ORDER — MAGNESIUM OXIDE 400 MG PO TABS: 400.0000 mg | ORAL_TABLET | Freq: Four times a day (QID) | ORAL | Status: DC

## 2013-11-28 MED ORDER — LORATADINE 10 MG PO TABS
10.0000 mg | ORAL_TABLET | Freq: Every day | ORAL | Status: DC
  Administered 2013-11-29 – 2013-12-09 (×10): 10 mg via ORAL
  Filled 2013-11-28 (×11): qty 1

## 2013-11-28 MED ORDER — FOLIC ACID 1 MG PO TABS
1.0000 mg | ORAL_TABLET | Freq: Every day | ORAL | Status: DC
  Administered 2013-11-29 – 2013-12-09 (×10): 1 mg via ORAL
  Filled 2013-11-28 (×11): qty 1

## 2013-11-28 MED ORDER — ADULT MULTIVITAMIN W/MINERALS CH
1.0000 | ORAL_TABLET | Freq: Every day | ORAL | Status: DC
  Administered 2013-11-29 – 2013-12-09 (×10): 1 via ORAL
  Filled 2013-11-28 (×11): qty 1

## 2013-11-28 MED ORDER — VITAMIN B-1 100 MG PO TABS
100.0000 mg | ORAL_TABLET | Freq: Every day | ORAL | Status: DC
  Administered 2013-11-29 – 2013-12-09 (×10): 100 mg via ORAL
  Filled 2013-11-28 (×11): qty 1

## 2013-11-28 MED ORDER — FLUOXETINE HCL 10 MG PO CAPS
10.0000 mg | ORAL_CAPSULE | Freq: Every day | ORAL | Status: DC
  Administered 2013-11-29 – 2013-12-09 (×11): 10 mg via ORAL
  Filled 2013-11-28 (×11): qty 1

## 2013-11-28 MED ORDER — ENSURE COMPLETE PO LIQD
237.0000 mL | Freq: Three times a day (TID) | ORAL | Status: DC
  Administered 2013-11-28 – 2013-11-29 (×2): 237 mL via ORAL

## 2013-11-28 MED ORDER — ASPIRIN EC 81 MG PO TBEC
81.0000 mg | DELAYED_RELEASE_TABLET | Freq: Every evening | ORAL | Status: DC
  Administered 2013-11-28 – 2013-12-07 (×9): 81 mg via ORAL
  Filled 2013-11-28 (×11): qty 1

## 2013-11-28 MED ORDER — PANTOPRAZOLE SODIUM 40 MG PO TBEC
40.0000 mg | DELAYED_RELEASE_TABLET | Freq: Every day | ORAL | Status: DC
  Administered 2013-11-28 – 2013-12-09 (×12): 40 mg via ORAL
  Filled 2013-11-28 (×12): qty 1

## 2013-11-28 MED ORDER — PANCRELIPASE (LIP-PROT-AMYL) 12000-38000 UNITS PO CPEP
1.0000 | ORAL_CAPSULE | Freq: Three times a day (TID) | ORAL | Status: DC
  Administered 2013-11-29 – 2013-12-07 (×21): 1 via ORAL
  Filled 2013-11-28 (×29): qty 1

## 2013-11-28 NOTE — Assessment & Plan Note (Signed)
BP Readings from Last 3 Encounters:  11/28/13 141/91  11/26/13 120/82  10/29/13 140/84    Lab Results  Component Value Date   NA 137 11/28/2013   K 4.2 11/28/2013   CREATININE 2.56* 11/28/2013    Assessment: Blood pressure control: mildly elevated Progress toward BP goal:  unchanged  Plan: Medications:  continue current medications, and will restart Amlodipine at 5mg  po daily Educational resources provided:   Self management tools provided:   Other plans: F/u for BP check in 1 month

## 2013-11-28 NOTE — Progress Notes (Signed)
[name], female   DOB: [birth year], 58 y.o.   MRN: [id number]  Subjective:   [name] female   DOB: 04-25-1956 58 y.o.   MRN: [id number]  HPI: [name] is a 58 y.o. 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  She was admitted 5/78 with AG metabolic acidosis and acute on chronic CKD and was found to have grade 2 diastolic heart failure. Lasix was started for diuresis and she was able to be diuresed 2L.   She does have a h/o FSGS and is followed by Nephrology. While in the hospital, her renal fx did worsen and was felt that this could be her new baseline. She has a f/u appt with Dr. Justin Mend on 7/1.   She was found to be hypotensive and her amlodipine was held.   She states that her blood sugars have been elevated at home. It was 480 last night after she had been drinking sweet tea. This morning it was checked again at home and was 128. She states that she has been drinking a lot of sweet tea.   She denies fevers, abdominal pain, or nausea, changes in urination, or leg swelling.   She does endorse mild chills this morning and had diarrhea yesterday x1. She is c/o mid-chest pain with breathing that is "sharp and achy" that she states was present while in the hospital and was attributed to coughing, but did resolve. Unfortunately the same pain returned this morning. She also c/o pain at her left shoulder where she fractured her arm in the past.    Past Medical History  Diagnosis Date  . Anemia, B12 deficiency   . History of acute pancreatitis   . Right knee pain     No recent imaging on chart  . 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 Dr. Mancel Bale, [facility]  . Hypertriglyceridemia   .  GERD (gastroesophageal reflux disease)   . Vitamin D deficiency   . Subdural hematoma 02/2008    Likely 2/2 trauma from seizure from EtOH withdrawal, chronic in nature, sees Dr. Jerene Bears. Most recent CT head 10/2009 showing stable but persistent hematoma without mass effect.  . History of seizure disorder     Likely 2/2 alcohol abuse  . Hypocalcemia   . Hypomagnesemia   . Failure to thrive in childhood     Unclear etiology  . HTN (hypertension)   . Thrombocytopenia   . Anemia, macrocytic   . Hepatomegaly     On exam  . Joint pain   . Alcohol abuse   . Vitamin D deficiency   . Menopause   . Pancreatitis   . Insomnia   . Hyperlipidemia   . Sinusitis   . Pernicious anemia   . Macrocytic anemia   . 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 Depression   . Fx humeral neck 04/17/2011    Transverse fracture- minimally displaced- managed as outpatient   . ABNORMAL PAP SMEAR, LGSIL 07/23/2008    Annotation: HPV positive CIN I Dr. Mancel Bale, [facility] Qualifier: Diagnosis of  By: Oretha Ellis    . Pneumonia 05/20/2012  . Seizures     "last one was in 2013; don't know  what kind" (08/15/2013)  . Arthritis     "shoulders" (08/15/2013)   Current Outpatient Prescriptions  Medication Sig Dispense Refill  . aspirin EC 81 MG tablet Take 1 tablet (81 mg total) by mouth every evening.  90 tablet  3  . calcium carbonate (TUMS) 500 MG chewable tablet Chew 6 tablets (1,200 mg of elemental calcium total) by mouth 3 (three) times daily. For bone health  90 tablet  3  . cetirizine (ZYRTEC) 10 MG tablet Take 1 tablet (10 mg total) by mouth daily.  90 tablet  3  . diclofenac sodium (VOLTAREN) 1 % GEL Apply 2 g topically 2 (two) times daily. To affected area  100 g  0  . feeding supplement, ENSURE COMPLETE, (ENSURE COMPLETE) LIQD Take 237 mLs by mouth 3 (three) times daily between meals.  90 Bottle  1  . ferrous sulfate 325 (65 FE) MG tablet Take 1 tablet (325 mg total) by mouth 2 (two)  times daily with a meal.  180 tablet  3  . FLUoxetine (PROZAC) 10 MG capsule Take 1 capsule (10 mg total) by mouth daily. For depression  90 capsule  4  . folic acid (FOLVITE) 1 MG tablet Take 1 tablet (1 mg total) by mouth daily. For folic acid replacement  30 tablet  3  . furosemide (LASIX) 40 MG tablet Take 1 tablet (40 mg total) by mouth daily.  30 tablet  2  . gabapentin (NEURONTIN) 300 MG capsule Take 2 capsules (600 mg total) by mouth 3 (three) times daily. For anxiety/pain control  540 capsule  1  . guaiFENesin-codeine 100-10 MG/5ML syrup Take 5 mLs by mouth every 4 (four) hours as needed for cough.  120 mL  0  . lipase/protease/amylase (CREON-12/PANCREASE) 12000 UNITS CPEP capsule Take 1 capsule by mouth 3 (three) times daily before meals.  270 capsule  3  . magnesium oxide (MAG-OX) 400 MG tablet Take 1 tablet (400 mg total) by mouth 4 (four) times daily.  360 tablet  6  . Multiple Vitamin (MULTIVITAMIN WITH MINERALS) TABS tablet Take 1 tablet by mouth daily. For vitamin replacement  90 tablet  3  . omeprazole (PRILOSEC) 40 MG capsule Take 1 capsule (40 mg total) by mouth daily.  90 capsule  4  . predniSONE (DELTASONE) 10 MG tablet Take 1 tablet (10 mg total) by mouth daily with breakfast.  3 tablet  0  . predniSONE (DELTASONE) 20 MG tablet Take 1 tablet (20 mg total) by mouth daily with breakfast.  3 tablet  0  . sodium bicarbonate 650 MG tablet Take 2 tablets (1,300 mg total) by mouth 2 (two) times daily.  60 tablet  0  . thiamine (VITAMIN B-1) 100 MG tablet Take 1 tablet (100 mg total) by mouth daily. For low thiamine  90 tablet  3  . vitamin B-12 (CYANOCOBALAMIN) 250 MCG tablet Take 1 tablet (250 mcg total) by mouth every evening.  90 tablet  3  . Vitamin D, Ergocalciferol, (DRISDOL) 50000 UNITS CAPS capsule Take 1 capsule (50,000 Units total) by mouth every 7 (seven) days. Thursdays: For bone health  4 capsule  3   No current facility-administered medications for this visit.   Family  History  Problem Relation Age of Onset  . Cancer Mother     Died from stomach cancer and "flesh eating rash  . Heart failure Father     Died in 26s from an MI  . Alcohol abuse Sister     Twin  sister drinks a lot, as did both her parents and brothers  . Stroke Brother     Has 7 brothers, 1 with CVA   History   Social History  . Marital Status: Divorced    Spouse Name: N/A    Number of Children: N/A  . Years of Education: N/A   Social History Main Topics  . Smoking status: Former Smoker -- 0.50 packs/day for 40 years    Types: Cigarettes    Quit date: 09/20/2010  . Smokeless tobacco: Never Used  . Alcohol Use: Yes     Comment: "sips"   . Drug Use: Yes    Special: Marijuana, Cocaine     Comment: 08/15/2013 "last drug use was in 2012"  . Sexual Activity: Yes   Other Topics Concern  . None   Social History Narrative   Lives with her significant other and 2 grandchildren. 1 child   Has 7 brothers and 4 sisters, 1 twin sister.   Unemployed, worked in [employer].    Abuses alcohol-drinks 1 glass of wine daily    No drug use. Former cigarette use quit 1.5 years ago.     11 th grade education            Review of Systems: A 12 point ROS was performed; pertinent positives and negatives were noted in the HPI   Objective:  Physical Exam: Filed Vitals:   11/28/13 1010 11/28/13 1013  BP: 141/91   Pulse: 111   Temp: 98.8 F (37.1 C)   TempSrc: Oral   Height: 5\' 1"  (1.549 m)   Weight: 98 lb 12.8 oz (44.815 kg)   SpO2:  91%   Constitutional: Vital signs reviewed.  Patient is cachectic female in no acute distress and cooperative with exam.  Head: Normocephalic and atraumatic Eyes: PERRL, EOMI Cardiovascular: RRR, +friction rub present. JVD present to the angle of the jaw. Pulmonary/Chest: Normal respiratory effort, +crackles about 1/3 way up the lungs Abdominal: Soft. Non-tender, non-distended Musculoskeletal/Extremities: No joint deformities or stiffness. No peripheral  edema. Neurological: A&O x3, nonfocal Skin: Warm, dry and intact.  Psychiatric: Normal mood and affect.   Assessment & Plan:    Please refer to Problem List based Assessment and Plan

## 2013-11-28 NOTE — Discharge Summary (Signed)
I saw [name] [name] on day of discharge and assisted in the discharge planning.

## 2013-11-28 NOTE — Progress Notes (Signed)
Attending physician note: Presenting problems, physical findings, medications, review with resident physician Dr. Lennart Pall and I concur with her management. Murriel Hopper, M.D., [place]

## 2013-11-28 NOTE — H&P (Signed)
Date: 11/28/2013               Patient Name:  [name] [name] MRN: [id number]  DOB: 1955/06/19 Age / Sex: 58 y.o., female   PCP: Otho Bellows, MD         Medical Service: Internal Medicine Teaching Service         Attending Physician: Dr. Axel Filler, MD    First Contact: Dr. Luanne Bras Pager: [phone]  Second Contact: Dr. Jessee Avers Pager: [phone]       After Hours (After 5p/  First Contact Pager: [phone]  weekends / holidays): Second Contact Pager: [phone]   Chief Complaint: chest pain  History of Present Illness: Ms. 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  She was recently hospitalized for AG metabolic acidosis and AKI on CKD.  She developed acute hypoxic respiratory failure 2/2 to volume overload during admission and ECHO revealed grade 2 diastolic dysfuntion.  She was appropriately diuresed and discharged on Lasix 1m daily.  She was seen in clinic today for hospital follow-up.  She says she felt ok but continued to have pleuritic chest pain. During last admission she reported chest tightness and pleuritic chest pain thought to be secondary to cough.  She says the chest pain returned last night.  It is 10/10, constant and worse with burping, coughing and taking deep breaths.  Leaning forward provides some relief.  She denies dyspnea, fever/chills, PND or orthopnea.  Her appetite is good, she denies N/V and her cough is improving.  She had diarrhea yesterday which has resolved today.  She reports compliance with Lasix.    Given symptoms suspicious for pericarditis coupled with tachycardia and new JVD she was directly admitted from clinic to evaluate for cardiac tamponade.  Meds: Current Facility-Administered Medications  Medication Dose Route Frequency Provider Last Rate Last Dose  . heparin injection 5,000 Units  5,000 Units Subcutaneous 3 times per day  RJessee Avers MD        Allergies: Allergies as of 11/28/2013 - Review Complete 11/28/2013  Allergen Reaction Noted  . Amitriptyline hcl Swelling   . Doxycycline hyclate Itching 11/10/2010   Past Medical History  Diagnosis Date  . Anemia, B12 deficiency   . History of acute pancreatitis   . Right knee pain     No recent imaging on chart  . 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  . Hypertriglyceridemia   . GERD (gastroesophageal reflux disease)   . Vitamin D deficiency   . Subdural hematoma 02/2008    Likely 2/2 trauma from seizure from EtOH withdrawal, chronic in nature, sees Dr. NJerene Bears Most recent CT head 10/2009 showing stable but persistent hematoma without mass effect.  . History of seizure disorder     Likely 2/2 alcohol abuse  . Hypocalcemia   . Hypomagnesemia   . Failure to thrive in childhood     Unclear etiology  . HTN (hypertension)   . Thrombocytopenia   . Anemia, macrocytic   . Hepatomegaly     On exam  . Joint pain   . Alcohol abuse   . Vitamin D deficiency   . Menopause   . Pancreatitis   . Insomnia   . Hyperlipidemia   . Sinusitis   . Pernicious anemia   .  Macrocytic anemia   . 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 Depression   . Fx humeral neck 04/17/2011    Transverse fracture- minimally displaced- managed as outpatient   . ABNORMAL PAP SMEAR, LGSIL 07/23/2008    Annotation: HPV positive CIN I Dr. Mancel Bale, [facility] Qualifier: Diagnosis of  By: Oretha Ellis    . Pneumonia 05/20/2012  . Seizures     "last one was in 2013; don't know what kind" (08/15/2013)  . Arthritis     "shoulders" (08/15/2013)   Past Surgical History  Procedure Laterality Date  . Cesarean section  1983  . Esophagogastroduodenoscopy  07/11/2011    Procedure: ESOPHAGOGASTRODUODENOSCOPY (EGD);  Surgeon: Beryle Beams, MD;  Location: [facility];  Service:  Endoscopy;  Laterality: N/A;  . Colonoscopy  07/11/2011    Procedure: COLONOSCOPY;  Surgeon: Beryle Beams, MD;  Location: [facility] ENDOSCOPY;  Service: Endoscopy;  Laterality: N/A;  . Eye surgery Left     "trauma"  . Right colectomy  08/28/2011  . Esophagogastroduodenoscopy N/A 12/01/2012    Procedure: ESOPHAGOGASTRODUODENOSCOPY (EGD);  Surgeon: Irene Shipper, MD;  Location: [facility];  Service: Endoscopy;  Laterality: N/A;  . Colonoscopy with esophagogastroduodenoscopy (egd) Left 08/21/2013    Procedure: COLONOSCOPY WITH ESOPHAGOGASTRODUODENOSCOPY (EGD);  Surgeon: Beryle Beams, MD;  Location: [facility];  Service: Endoscopy;  Laterality: Left;   Family History  Problem Relation Age of Onset  . Cancer Mother     Died from stomach cancer and "flesh eating rash  . Heart failure Father     Died in 34s from an MI  . Alcohol abuse Sister     Twin sister drinks a lot, as did both her parents and brothers  . Stroke Brother     Has 7 brothers, 1 with CVA   History   Social History  . Marital Status: Divorced    Spouse Name: N/A    Number of Children: N/A  . Years of Education: N/A   Occupational History  . Not on file.   Social History Main Topics  . Smoking status: Former Smoker -- 0.50 packs/day for 40 years    Types: Cigarettes    Quit date: 09/20/2010  . Smokeless tobacco: Never Used  . Alcohol Use: Yes     Comment: "sips"   . Drug Use: Yes    Special: Marijuana, Cocaine     Comment: 08/15/2013 "last drug use was in 2012"  . Sexual Activity: Yes   Other Topics Concern  . Not on file   Social History Narrative   Lives with her significant other and 2 grandchildren. 1 child   Has 7 brothers and 4 sisters, 1 twin sister.   Unemployed, worked in [employer].    Abuses alcohol-drinks 1 glass of wine daily    No drug use. Former cigarette use quit 1.5 years ago.     11 th grade education             Review of Systems: Pertinent items are noted in HPI. General:  Denies  fevers, chills or decreased appetite. HEENT:  Denies sore throat or sneezing. Heart:  Per HPI Lungs:  Reports improving cough.  Denies dyspnea. GI:  Reports diarrhea (x1 day).  Denies abdominal pain, nausea or vomiting. GU:  Denies dysuria or hematuria. Neuro:  Denies headache, lightheadedness or change in vision.  Physical Exam: Blood pressure 152/98, pulse 102, temperature 98.2 F (36.8 C), temperature source Oral,  resp. rate 16, SpO2 93.00%. General: resting in bed in NAD HEENT: PERRL, EOMI, neck with full ROM Cardiac: tachycardic, regular rhythm, no murmurs or gallops, + friction rub, + JVD on right Pulm: bibasilar crackles, no wheezes, moving normal volumes of air, no respiratory distress, on room air Abd: soft, nontender, nondistended, BS present Ext: warm and well perfused, 1+ pitting edema (R slightly > L), lower extremities are non-tender, 2+ DPs and radial pulses Left shoulder:  Anterior shoulder is non-tender to palpation; posterior/lateral shoulder TTP.  Left shoulder joint with good ROM, extension limited to 90 degrees 2/2 to pain, strength is intact Neuro: alert and oriented X3, cranial nerves II-XII grossly intact, strength and sensation intact upper and lower extremities Psych:  Mood, affect and behavior normal.  Lab results: Basic Metabolic Panel:  Recent Labs  11/26/13 0240 11/28/13 1112  NA 137 137  K 4.2 4.2  CL 102 95*  CO2 18* 24  GLUCOSE 142* 114*  BUN 39* 51*  CREATININE 2.67* 2.56*  CALCIUM 8.6 7.9*  MG  --  1.1*  PHOS 3.1 2.2*   Liver Function Tests:  Recent Labs  11/26/13 0240 11/28/13 1112  ALBUMIN 1.9* 2.3*   CBC:  Recent Labs  11/26/13 0240 11/28/13 1112  WBC 25.0* 14.2*  HGB 7.6* 8.9*  HCT 22.5* 27.0*  MCV 96.6 97.1  PLT 180 236   Cardiac Enzymes:  Recent Labs  11/28/13 1112  TROPONINI <0.30   BNP:  Recent Labs  11/28/13 2050  PROBNP 17837.0*   CBG:  Recent Labs  11/25/13 1935 11/26/13 0812 11/26/13 1215    GLUCAP 335* 114* 146*   Hemoglobin A1C:  Recent Labs  11/28/13 1152  HGBA1C 5.4   Anemia Panel:  Recent Labs  11/26/13 1030  FERRITIN 1035*  TIBC 138*  IRON 102    Imaging results:  Dg Chest 2 View  11/28/2013   CLINICAL DATA:  increased sob  EXAM: CHEST  2 VIEW  COMPARISON:  Prior radiograph from 11/26/2013  FINDINGS: Cardiomegaly is stable as compared to prior exam.  Lungs are mildly hypoinflated. There is persistent diffuse pulmonary edema, slightly improved from prior. Bilateral pleural effusions are also slightly improved. No definite new focal infiltrate. There is no pneumothorax.  Chronic fracture deformity of the proximal left humerus noted, unchanged. No acute osseous abnormality.  IMPRESSION: Slight interval improvement in diffuse pulmonary edema with decreased size of bilateral pleural effusions, consistent with improving CHF.   Electronically Signed   By: Jeannine Boga M.D.   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  Acute pericarditis: Pleuritic chest pain and friction rub c/w pericarditis.  Recent ECHO (4 days ago) revealed a small pericardial effusion which did not appear to be causing hemodynamic compromise.  No hypotension or muffled heart sounds to suggest cardiac tamponade.  There is no need for urgent bedside ECHO or emergent pericardiocentesis given her hemodynamic stability.  No dyspnea or hypoxia and Well's score 1.5 making PE less likely.   Initial troponin negative and no ischemic findings on EKG to suggest ACS.  Unclear etiology of her pericarditis.  No MI or cardiac surgery to suggest cardiac cause.  Her mother had SLE and  she was ANA and RF  positive in 2009, though no mention of autoimmune dx in EPIC.  Less likely uremia as BUN is < 60, normal mentation, no N/V/abd pain.  Likely viral in the setting of recent bronchitis or idiopathic.  - admit to IMTS - monitor on telemetry - prednisone 105m daily x 2 weeks followed by taper (no NSAID 2/2 to CKD) - colchicine 0.320mdaily x 3 months - trend CE x 3 - check ESR, CRP; repeat ANA and RF - 2-view CXR, repeat EKG - repeat 2D ECHO to evaluate size of effusion, r/o tamponade or myopericarditis   Volume overload in the setting of diastolic heart failure: She was diuresed 2L during recent admission and discharged on Lasix 4070mo daily.  Although she is 2 pounds less than her discharge weight, she has +JVD, crackles, LE edema, CXR with persistent though improved pulmonary edema and pleural effusions, proBNP 17,837 (CKD could be confounding). - Lasix IV 70m62m1 - reassess and re-dose Lasix tomorrow - daily weights and strict I&Os  Left shoulder pain: Pain at site of prior fracture.  The site is stable on CXR.  Her upper extremity strength is intact and she has good ROM though arm extension limited by pain.  This pain is likely referred pain 2/2 to pericarditis.  - Tylenol and Lidoderm patch for local pain - treating pericarditis as above  Viral bronchitis: Treated with supportive care at recent hospitalization.  She reports improving cough and denies fever or dyspnea. - continue guaifenesin-codeine prn for cough  Hypomagnesemia:  Mg 1.1.  She reports compliance with po supplementation. 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  Baseline Cr ~1.3-1.5, CKD3 prior to recent admission however was thought to have progressing renal failure and likely has new baseline Cr.  FSGS dx by biopsy 10/2013.  Increase Cr at prior admission likely 2/2 to progressive CKD, now GFR c/w CKD4. - continue BP  control - follow-up with Dr. WebbJustin Mendphro) already scheduled for 07/01  Anion gap metabolic acidosis:  Chronic and likely 2/2 to CKD4. - continue NaHCO3   Anemia of chronic disease:  Hgb 8.9 at admission, close to baseline of 8-10. - monitor CBC - Fe supplement discontinued by PCP   HTN:   Mild elevation.  She had been on amlodipine 10mg61mich was discontinued at discharge.  PCP resumed amlodipine 5mg d14my at clinic visit today. - continue amlodipine 5mg - 35mitor vitals per protocol  Prior EtOH abuse:  Patient reports having quit a few months ago.   - CIWA was not ordered this admission because she has no current signs of w/d and no ativan required during recent admission  - home MV, thiamine, folic acid and Creon continued  Diet:  Renal w/1200mL fl54mrestriction VTE ppx:  Heparin Gila Code:  Full  Dispo: Disposition is deferred at this time, awaiting improvement of current medical problems. Anticipated discharge in approximately 1-2 day(s).   The patient does have a current PCP ([facility].  The patient does not know have transportation limitations that hinder transportation to clinic appointments.  Signed: Alex WilDuwaine Maxin6/2015, 7:24 PM

## 2013-11-28 NOTE — Assessment & Plan Note (Addendum)
Pain not reproducible on exam. Likely pleuritic in nature. Troponin negative in the clinic. Recommended Tylenol for her pain. She is to call the clinic if her pain does not resolve or worsens.

## 2013-11-28 NOTE — Assessment & Plan Note (Signed)
Repeated Renal function panel today. Cr slightly improved from last check. She has an appt with Dr. Justin Mend at [facility] on 12/03/13.

## 2013-11-28 NOTE — Assessment & Plan Note (Signed)
Pt is supposed to be taking MagOx 400mg  QID and endorses compliance with the medications. However her Mg is down to 1.1 today from 1.6 on 6/23. It was 1.1 on hospital admission 11/22/13. I am not convinced that she is taking the magnesium as prescribed.  - Encouraged the patient to take the Magnesium as prescribed QID. - Will repeat Mg at her next visit

## 2013-11-28 NOTE — Assessment & Plan Note (Addendum)
Pt with hyperglycemia in the hospital requiring insulin. This was not continued at discharge. She has no h/o diabetes. However, the pt states that a family member with a glucometer checked her CBG yesterday evening and it was 480. It was rechecked at home this morning and was 128. An A1c was checked in the clinic. Glucose on BMP 111 and A1c 5.4.  - Discussed diet with the patient and recommended cutting back on the sugar sweetened drinks.

## 2013-11-28 NOTE — Patient Instructions (Addendum)
**  Stop taking the ferrous sulfate (iron supplement)  **I am restarting your amlodipine at a lower dose, at 2.5mg  daily. At your next visit we will reassess your blood pressure and increase the dose if needed.   **You can take Tylenol as needed for the pain you are having in your arm and chest. This is likely pleuritic in nature and is not cardiac.  **Please be sure to keep your appointment with Dr. Justin Mend on 12/03/13.   General Instructions:   Please bring your medicines with you each time you come to clinic.  Medicines may include prescription medications, over-the-counter medications, herbal remedies, eye drops, vitamins, or other pills.   Progress Toward Treatment Goals:  Treatment Goal 08/27/2013  Blood pressure deteriorated  Prevent falls -    Self Care Goals & Plans:  Self Care Goal 09/18/2013  Manage my medications take my medicines as prescribed; bring my medications to every visit; refill my medications on time; follow the sick day instructions if I am sick  Monitor my health keep track of my weight  Eat healthy foods eat more vegetables; eat fruit for snacks and desserts; eat baked foods instead of fried foods; eat foods that are low in salt; eat smaller portions  Be physically active find an activity I enjoy    No flowsheet data found.   Care Management & Community Referrals:  Referral 05/21/2013  Referrals made for care management support (No Data)  Referrals made to community resources -

## 2013-11-28 NOTE — Assessment & Plan Note (Signed)
ECHO done on admission w/ grade 2 diastolic dysfunction. Pt volume overloaded on admission and was started on Lasix 40mg  daily, which she endorses compliance with. Pt's weight is down 2 lbs from hospital discharge. She does still have crackles at the bases of her lungs, but denies any increased SOB or difficulty breathing. No peripheral edema. - Will continue the Lasix 40mg  daily and have her f/u in 1 mo

## 2013-11-28 NOTE — Assessment & Plan Note (Addendum)
Pt presents with mid-sternal chest pain that has been intermittent since her hospitalization and restarted this morning. It is improved with leaning forward. On cardiac exam she has a friction rub that was not documented previously. She had a TTE during her hospitalization which showed a new, small pericardial effusion. Pt likely with acute pericarditis. Troponin checked today was negative. EKG with sinus rhythm, unchanged from previous EKGs. She is tachycardic but has a h/o tachycardia. She is afebrile and in no acute distress. She does have JVD, and in speaking to her physician in the hospital she did not have JVD at discharge, which is concerning for worsening of her pericardial effusion.  - Will admit for observation given the reoccurrence of JVD in the setting of acute pericarditis.  - She will need repeat ECHO - Consider Cardiology consult  - With her FSGS and CKD 3, I am concerned about beginning NSAID therapy. Glucocorticoids are an option. Per UpToDate, high dose steroids, 1 mg/kg/day is recommended by 2004 European Society of Cardiology Guidelines; however lower doses, 0.25-0.5mg /kg/day may be effective. Colchicine 0.5 mg once daily for 3 months is also recommended in addition the the glucocorticoids; however with her  CrCl <30, the dose should be decreased to 0.3mg .   The patient was discussed with Dr. Beryle Beams and Dr. Marinda Elk.

## 2013-11-28 NOTE — Progress Notes (Signed)
I notified Dr. Alice Rieger that pt here and needing admission orders and for stat echo, protocol after hrs is to call a cardiologist for that echo. He stated he would follow through with that

## 2013-11-28 NOTE — Assessment & Plan Note (Addendum)
She is on iron supplementation and reportedly received an inj of darbepoetin in the hospital. Iron panel with normal iron and significantly elevated ferritin. TIBC is low. Checked CBC today and Hgb up to 8.9 from 7.6. Likely anemia of chronic disease with increased ferritin contributed by supplementation and darbepoetin.  - Stopping ferrous sulfate

## 2013-11-29 ENCOUNTER — Other Ambulatory Visit: Payer: Self-pay | Admitting: Nurse Practitioner

## 2013-11-29 DIAGNOSIS — I509 Heart failure, unspecified: Secondary | ICD-10-CM

## 2013-11-29 DIAGNOSIS — I313 Pericardial effusion (noninflammatory): Secondary | ICD-10-CM

## 2013-11-29 DIAGNOSIS — J209 Acute bronchitis, unspecified: Secondary | ICD-10-CM

## 2013-11-29 DIAGNOSIS — E43 Unspecified severe protein-calorie malnutrition: Secondary | ICD-10-CM

## 2013-11-29 DIAGNOSIS — D518 Other vitamin B12 deficiency anemias: Secondary | ICD-10-CM

## 2013-11-29 DIAGNOSIS — K219 Gastro-esophageal reflux disease without esophagitis: Secondary | ICD-10-CM

## 2013-11-29 DIAGNOSIS — E872 Acidosis, unspecified: Secondary | ICD-10-CM

## 2013-11-29 DIAGNOSIS — N032 Chronic nephritic syndrome with diffuse membranous glomerulonephritis: Secondary | ICD-10-CM

## 2013-11-29 DIAGNOSIS — F1010 Alcohol abuse, uncomplicated: Secondary | ICD-10-CM

## 2013-11-29 DIAGNOSIS — I5033 Acute on chronic diastolic (congestive) heart failure: Secondary | ICD-10-CM

## 2013-11-29 DIAGNOSIS — I309 Acute pericarditis, unspecified: Principal | ICD-10-CM

## 2013-11-29 DIAGNOSIS — I3139 Other pericardial effusion (noninflammatory): Secondary | ICD-10-CM

## 2013-11-29 DIAGNOSIS — I10 Essential (primary) hypertension: Secondary | ICD-10-CM

## 2013-11-29 DIAGNOSIS — R071 Chest pain on breathing: Secondary | ICD-10-CM

## 2013-11-29 DIAGNOSIS — N183 Chronic kidney disease, stage 3 unspecified: Secondary | ICD-10-CM

## 2013-11-29 LAB — MAGNESIUM
MAGNESIUM: 3 mg/dL — AB (ref 1.5–2.5)
MAGNESIUM: 2.6 mg/dL — AB (ref 1.5–2.5)

## 2013-11-29 LAB — COMPREHENSIVE METABOLIC PANEL
ALK PHOS: 294 U/L — AB (ref 39–117)
ALT: 22 U/L (ref 0–35)
AST: 37 U/L (ref 0–37)
Albumin: 2 g/dL — ABNORMAL LOW (ref 3.5–5.2)
BUN: 53 mg/dL — ABNORMAL HIGH (ref 6–23)
CHLORIDE: 93 meq/L — AB (ref 96–112)
CO2: 19 meq/L (ref 19–32)
CREATININE: 2.58 mg/dL — AB (ref 0.50–1.10)
Calcium: 7.2 mg/dL — ABNORMAL LOW (ref 8.4–10.5)
GFR calc Af Amer: 23 mL/min — ABNORMAL LOW (ref >90)
GFR, EST NON AFRICAN AMERICAN: 20 mL/min — AB (ref >90)
Glucose, Bld: 263 mg/dL — ABNORMAL HIGH (ref 70–99)
Potassium: 4.6 mEq/L (ref 3.7–5.3)
Sodium: 133 mEq/L — ABNORMAL LOW (ref 137–147)
Total Protein: 7.5 g/dL (ref 6.0–8.3)

## 2013-11-29 LAB — PHOSPHORUS: Phosphorus: 3 mg/dL (ref 2.3–4.6)

## 2013-11-29 LAB — BASIC METABOLIC PANEL
BUN: 54 mg/dL — ABNORMAL HIGH (ref 6–23)
CO2: 23 mEq/L (ref 19–32)
Calcium: 7.6 mg/dL — ABNORMAL LOW (ref 8.4–10.5)
Chloride: 93 mEq/L — ABNORMAL LOW (ref 96–112)
Creatinine, Ser: 2.52 mg/dL — ABNORMAL HIGH (ref 0.50–1.10)
GFR calc non Af Amer: 20 mL/min — ABNORMAL LOW (ref >90)
GFR, EST AFRICAN AMERICAN: 23 mL/min — AB (ref >90)
Glucose, Bld: 238 mg/dL — ABNORMAL HIGH (ref 70–99)
POTASSIUM: 4.5 meq/L (ref 3.7–5.3)
Sodium: 135 mEq/L — ABNORMAL LOW (ref 137–147)

## 2013-11-29 LAB — CBC WITH DIFFERENTIAL/PLATELET
BASOS ABS: 0 10*3/uL (ref 0.0–0.1)
Basophils Relative: 0 % (ref 0–1)
Eosinophils Absolute: 0 10*3/uL (ref 0.0–0.7)
Eosinophils Relative: 0 % (ref 0–5)
HCT: 24.1 % — ABNORMAL LOW (ref 36.0–46.0)
Hemoglobin: 7.9 g/dL — ABNORMAL LOW (ref 12.0–15.0)
LYMPHS ABS: 1.4 10*3/uL (ref 0.7–4.0)
LYMPHS PCT: 8 % — AB (ref 12–46)
MCH: 32.2 pg (ref 26.0–34.0)
MCHC: 32.8 g/dL (ref 30.0–36.0)
MCV: 98.4 fL (ref 78.0–100.0)
Monocytes Absolute: 1.5 10*3/uL — ABNORMAL HIGH (ref 0.1–1.0)
Monocytes Relative: 9 % (ref 3–12)
NEUTROS ABS: 14.3 10*3/uL — AB (ref 1.7–7.7)
NEUTROS PCT: 83 % — AB (ref 43–77)
Platelets: 236 10*3/uL (ref 150–400)
RBC: 2.45 MIL/uL — AB (ref 3.87–5.11)
RDW: 14 % (ref 11.5–15.5)
WBC: 17.1 10*3/uL — ABNORMAL HIGH (ref 4.0–10.5)

## 2013-11-29 LAB — URINALYSIS, ROUTINE W REFLEX MICROSCOPIC
BILIRUBIN URINE: NEGATIVE
Glucose, UA: NEGATIVE mg/dL
HGB URINE DIPSTICK: NEGATIVE
KETONES UR: NEGATIVE mg/dL
Leukocytes, UA: NEGATIVE
Nitrite: NEGATIVE
PROTEIN: NEGATIVE mg/dL
Specific Gravity, Urine: 1.01 (ref 1.005–1.030)
UROBILINOGEN UA: 0.2 mg/dL (ref 0.0–1.0)
pH: 6 (ref 5.0–8.0)

## 2013-11-29 LAB — TROPONIN I
Troponin I: <0.3 ng/mL (ref <0.30)
Troponin I: <0.3 ng/mL (ref <0.30)

## 2013-11-29 LAB — SEDIMENTATION RATE: Sed Rate: 141 mm/hr — ABNORMAL HIGH (ref 0–22)

## 2013-11-29 LAB — C-REACTIVE PROTEIN: CRP: 5.5 mg/dL — ABNORMAL HIGH (ref <0.60)

## 2013-11-29 MED ORDER — FUROSEMIDE 10 MG/ML IJ SOLN
20.0000 mg | Freq: Once | INTRAMUSCULAR | Status: AC
  Administered 2013-11-29: 20 mg via INTRAVENOUS
  Filled 2013-11-29: qty 2

## 2013-11-29 MED ORDER — MAGNESIUM OXIDE 400 MG PO TABS: 400.0000 mg | ORAL_TABLET | Freq: Two times a day (BID) | ORAL | Status: DC

## 2013-11-29 MED ORDER — MAGNESIUM OXIDE 400 (241.3 MG) MG PO TABS: 400.0000 mg | ORAL_TABLET | Freq: Three times a day (TID) | ORAL | Status: DC

## 2013-11-29 MED ORDER — PREDNISONE 20 MG PO TABS: 20.0000 mg | ORAL_TABLET | Freq: Every day | ORAL | Status: DC

## 2013-11-29 MED ORDER — MAGNESIUM OXIDE 400 (241.3 MG) MG PO TABS
400.0000 mg | ORAL_TABLET | Freq: Three times a day (TID) | ORAL | Status: DC
  Administered 2013-12-01 – 2013-12-09 (×29): 400 mg via ORAL
  Filled 2013-11-29 (×39): qty 1

## 2013-11-29 MED ORDER — ENSURE PUDDING PO PUDG
1.0000 | Freq: Three times a day (TID) | ORAL | Status: DC
  Administered 2013-11-29 – 2013-12-09 (×20): 1 via ORAL

## 2013-11-29 MED ORDER — ACETAMINOPHEN 500 MG PO TABS: 500.0000 mg | ORAL_TABLET | ORAL | Status: DC | PRN

## 2013-11-29 MED ORDER — COLCHICINE 0.6 MG PO TABS: 0.3000 mg | ORAL_TABLET | Freq: Every day | ORAL | Status: DC

## 2013-11-29 MED ORDER — FUROSEMIDE 40 MG PO TABS
40.0000 mg | ORAL_TABLET | Freq: Every day | ORAL | Status: DC
  Administered 2013-11-29 – 2013-12-03 (×5): 40 mg via ORAL
  Filled 2013-11-29 (×7): qty 1

## 2013-11-29 NOTE — Progress Notes (Addendum)
Subjective:    Interval Events:  She feel better today.  Breathing is better. Chest pain is better. On steroids She had an echo performed this am awaiting results     Objective:     Last BM Date: 11/28/13   Weights: 24-hour Weight change:   Filed Weights   11/29/13 0311  Weight: 98 lb 3.2 oz (44.543 kg)     Intake/Output:   Intake/Output Summary (Last 24 hours) at 11/29/13 1136 Last data filed at 11/29/13 0500  Gross per 24 hour  Intake    720 ml  Output   1100 ml  Net   -380 ml       Physical Exam: Vital Signs:   Temp:  [97.9 F (36.6 C)-98.9 F (37.2 C)] 97.9 F (36.6 C) (06/27 0311) Pulse Rate:  [92-109] 100 (06/27 0941) Resp:  [16] 16 (06/27 0311) BP: (127-152)/(87-98) 131/87 mmHg (06/27 0941) SpO2:  [91 %-93 %] 91 % (06/27 0311) Weight:  [98 lb 3.2 oz (44.543 kg)] 98 lb 3.2 oz (44.543 kg) (06/27 0311) General: thin woman, no acute distress. Talking with family on phone, lying flat in bed Lungs/Chest wall: clear to auscultation bilaterally, normal work of breathing  Heart: cardiac friction rub heard with tachycardia. normal rate, RRR; no murmurs. JVD is improved.  Pulses: peripheral pulse are 2+ and symmetric  Abdomen: Normal fullness, nontender to palpation,  no masses or organomegaly  Skin: warm, dry, intact, no rashes  Extremities: +1 bilateral peripheral edema. Otherwise, warm, peripheral pulses are present. Normal power.  Neurologic: A&O X3, CN II - XII are grossly intact.   Labs: Basic Metabolic Panel:  Recent Labs Lab 11/22/13 1450  11/24/13 0355 11/25/13 0155  11/25/13 0950 11/25/13 1825 11/26/13 0240 11/28/13 1112 11/29/13 0143 11/29/13 0310 11/29/13 0720  NA  --   < > 131* 134*  < >  --  137 137 137 133*  --  135*  K  --   < > 5.0 6.2*  < >  --  4.2 4.2 4.2 4.6  --  4.5  CL  --   < > 101 106  < >  --  104 102 95* 93*  --  93*  CO2  --   < > 16* 15*  < >  --  17* 18* 24 19  --  23  GLUCOSE  --   < > 197* 283*  < >  --  294*  142* 114* 263*  --  238*  BUN  --   < > 32* 37*  < >  --  39* 39* 51* 53*  --  54*  CREATININE  --   < > 2.16* 2.41*  < >  --  2.66* 2.67* 2.56* 2.58*  --  2.52*  CALCIUM  --   < > 8.1* 8.2*  < >  --  8.6 8.6 7.9* 7.2*  --  7.6*  MG 1.1*  < > 1.7 1.6  --   --   --   --  1.1* 3.0*  --  2.6*  PHOS 4.5  --   --   --   --  2.8  --  3.1 2.2*  --  3.0  --   < > = values in this interval not displayed.  Liver Function Tests:  Recent Labs Lab 11/22/13 1450 11/23/13 0512 11/26/13 0240 11/28/13 1112 11/29/13 0143  AST 37 24  --   --  37  ALT 13 9  --   --  22  ALKPHOS 287* 231*  --   --  294*  BILITOT 0.3 <0.2*  --   --  <0.2*  PROT 9.1* 7.5  --   --  7.5  ALBUMIN 2.8* 2.2* 1.9* 2.3* 2.0*     CBC:  Recent Labs Lab 11/23/13 0512 11/24/13 0355 11/26/13 0240 11/28/13 1112 11/29/13 0720  WBC 10.7* 12.8* 25.0* 14.2* 17.1*  NEUTROABS  --   --   --   --  14.3*  HGB 7.8* 8.1* 7.6* 8.9* 7.9*  HCT 22.9* 24.1* 22.5* 27.0* 24.1*  MCV 94.6 95.3 96.6 97.1 98.4  PLT 191 178 180 236 236    Cardiac Enzymes:  Recent Labs Lab 11/24/13 2058 11/28/13 1112 11/28/13 2050 11/29/13 0133 11/29/13 0720  TROPONINI <0.30 <0.30 <0.30 <0.30 <0.30    CBG:  Recent Labs Lab 11/25/13 1401 11/25/13 1605 11/25/13 1935 11/26/13 0812 11/26/13 1215  GLUCAP 271* 294* 335* 114* 146*    Coagulation Studies: No results found for this basename: LABPROT, INR,  in the last 72 hours  Microbiology: Results for orders placed during the hospital encounter of 11/22/13  CULTURE, BLOOD (ROUTINE X 2)     Status: None   Collection Time    11/24/13  8:30 AM      Result Value Ref Range Status   Specimen Description BLOOD RIGHT ARM   Final   Special Requests BOTTLES DRAWN AEROBIC ONLY 1CC   Final   Culture  Setup Time     Final   Value: 11/24/2013 14:18     Performed at [facility]   Culture     Final   Value:        BLOOD CULTURE RECEIVED NO GROWTH TO DATE CULTURE WILL BE HELD FOR 5 DAYS  BEFORE ISSUING A FINAL NEGATIVE REPORT     Performed at [facility]   Report Status PENDING   Incomplete  CULTURE, BLOOD (ROUTINE X 2)     Status: None   Collection Time    11/24/13  8:34 AM      Result Value Ref Range Status   Specimen Description BLOOD LEFT HAND   Final   Special Requests BOTTLES DRAWN AEROBIC ONLY 3CC   Final   Culture  Setup Time     Final   Value: 11/24/2013 14:16     Performed at [facility]   Culture     Final   Value:        BLOOD CULTURE RECEIVED NO GROWTH TO DATE CULTURE WILL BE HELD FOR 5 DAYS BEFORE ISSUING A FINAL NEGATIVE REPORT     Performed at [facility]   Report Status PENDING   Incomplete  MRSA PCR SCREENING     Status: None   Collection Time    11/24/13 11:28 AM      Result Value Ref Range Status   MRSA by PCR NEGATIVE  NEGATIVE Final   Comment:            The GeneXpert MRSA Assay (FDA     approved for NASAL specimens     only), is one component of a     comprehensive MRSA colonization     surveillance program. It is not     intended to diagnose MRSA     infection nor to guide or     monitor treatment for     MRSA infections.     Imaging: Dg Chest 2 View  11/28/2013   CLINICAL DATA:  increased sob  EXAM: CHEST  2 VIEW  COMPARISON:  Prior radiograph from 11/26/2013  FINDINGS: Cardiomegaly is stable as compared to prior exam.  Lungs are mildly hypoinflated. There is persistent diffuse pulmonary edema, slightly improved from prior. Bilateral pleural effusions are also slightly improved. No definite new focal infiltrate. There is no pneumothorax.  Chronic fracture deformity of the proximal left humerus noted, unchanged. No acute osseous abnormality.  IMPRESSION: Slight interval improvement in diffuse pulmonary edema with decreased size of bilateral pleural effusions, consistent with improving CHF.   Electronically Signed   By: Jeannine Boga M.D.   On: 11/28/2013 21:47      Medications:    Infusions:      Scheduled Medications: . amLODipine  5 mg Oral Daily  . aspirin EC  81 mg Oral QPM  . calcium carbonate  6 tablet Oral TID  . colchicine  0.3 mg Oral Daily  . vitamin B-12  500 mcg Oral QPM  . feeding supplement (ENSURE COMPLETE)  237 mL Oral TID BM  . FLUoxetine  10 mg Oral Daily  . folic acid  1 mg Oral Daily  . gabapentin  600 mg Oral TID  . heparin  5,000 Units Subcutaneous 3 times per day  . lidocaine  1 patch Transdermal Q24H  . lipase/protease/amylase  1 capsule Oral TID AC  . loratadine  10 mg Oral Daily  . [START ON 12/01/2013] magnesium oxide  400 mg Oral TID PC & HS  . multivitamin with minerals  1 tablet Oral Daily  . pantoprazole  40 mg Oral Daily  . predniSONE  20 mg Oral Q breakfast  . sodium bicarbonate  1,300 mg Oral BID  . thiamine  100 mg Oral Daily  . [START ON 12/04/2013] Vitamin D (Ergocalciferol)  50,000 Units Oral Q7 days     PRN Medications: acetaminophen, guaiFENesin-codeine   Assessment/ Plan:    Acute pericarditis: classic presentation. Symptoms are better with reduced chest pain. Trop have been negative 3. JVD is improved from today's exam. ESR 141. CRP 5.5. Etiology is still being investigated infection (TB and viral) versus autoimmune (lupus). Also uremia can not be excluded.   Plan  - consult cardiology to assist with evaluation and treatment - echo pending reading .  - cont with prednisone 20 mg daily x 2 weeks followed by taper regimen  ( 2.5 mg/day every 2 weeks as: 20 mg daily to 11/28/2013 - 12/11/2013. Then 17.5 mg daily from 12/12/2013 to 12/26/2013.  then 15 mg daily from 12/27/2013 to 01/10/2014,  then 12.5 mg daily from 01/11/2014 to 02/25/2014, then 10 mg daily from 01/26/2014 to 02/09/2014 then 7.5 mg daily from 02/10/2014 to 9/22/20155  then 5 mg daily from 02/25/2014 to 03/11/2014 then 2.5 mg daily from 03/12/2014 to 03/26/2014 and stop)  - colchicine 0.76m daily x 3 months ( end date 02/28/2014)  - ordered Quantiferon to rule extra pulmonary   - she will likely be discharged after cards evaluation  Acute exacerbation of chronic diastolic heart failure: Improved. Good urine output. JVD is better. She has received 60 mg total of IV lasix. She takes po lasix 40 mg daily. She was evaluated by renal last admission. Plan  - will restart po lasix 40 mg daily.  -May require a higher dose depending on presence or abscess of pericardial effusion echo.  - cardiology consulted.  - daily weights and strict I&Os    Hypomagnesemia: Mg 1.1 increased to 3.0 with a single dose replacement. 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 Found to be higher during her recent admission and has remained elevated at ~2.6. Has a outpatient f/u with Dr Justin Mend on 7/1.  Plan  - monitor renal function while here  Anion gap metabolic acidosis: Chronic and likely 2/2 to CKD4.  - continue NaHCO3   Anemia of chronic disease: Hgb 8.9 at admission, close to baseline of 8-10.  - monitor CBC  - Fe supplement discontinued by PCP   HTN: cont with amlodipine   Diet: Renal w/1264m fluid restriction  VTE ppx: Heparin [place]  Code: Full  Dispo: Disposition is deferred at this time, awaiting improvement of current medical problems. Anticipated discharge today pending cardiology evaluation and echo results   The patient does have a current PCP (Otho Bellows MD) and [facility]  The patient does not know have transportation limitations that hinder transportation to clinic appointments.    Length of Stay: 1 days   Signed by:  RJessee Avers MD PGY-2, Internal Medicine Pager [phone]/27/2015, 11:36 AM

## 2013-11-29 NOTE — Progress Notes (Signed)
  Echocardiogram 2D Echocardiogram limited has been performed.  [name] [name], [name] [name] 11/29/2013, 9:07 AM

## 2013-11-29 NOTE — Progress Notes (Addendum)
Patient magnesium level 3.0, On call MD made aware, orders given for 1xdose of lasix 20mg  IV, vitals and STAT EKG. Will continue to monitor patient.

## 2013-11-29 NOTE — Progress Notes (Signed)
INITIAL NUTRITION ASSESSMENT  DOCUMENTATION CODES Per approved criteria  -Severe malnutrition in the context of chronic illness -Underweight  Pt meets criteria for severe MALNUTRITION in the context of chronic illness as evidenced by severe muscle depletion and subcutaneous fat loss in multiple body areas.   INTERVENTION: -Recommend Ensure Pudding po TID, each supplement provides 170 kcal and 4 grams of protein -Reviewed current diet restrictions -Consider liberalizing to Low Sodium 1200 ml fluid if PO intake decreases to <75%  NUTRITION DIAGNOSIS: Increased nutrient needs (protein/kcal) related to increased demand for nutrients as evidenced by underweight status.   Goal: Pt to meet >/= 90% of their estimated nutrition needs    Monitor:  Total protein/energy intake, labs, weights, diet education needs  Reason for Assessment: MST/Underweight BMI  58 y.o. 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 Is consuming two-three meals/day and drinking Ensure Complete TID. Weight has trended up since 06/2013, gained 15 lbs in past 6 months, since of which may be attributed to current fluid overload -Had questions concerning diet restrictions-explained rationale for fluid restriction and nutrient restrictions of renal diet. Pt verbalized understanding, in agreement to decrease intake of high sodium foods -Was willing to modify Ensure Complete to Ensure pudding to comply with 1200 ml fluid restrictions. Pt request they be ordered as snack as she feels hungry in-between meals -Current PO intake 100% -Denied  any nausea/vomiting pta -Low Mg-improving with supplementation -Pt with severe muscle wasting and subcutaneous fat loss in orbital, upper arm, temple, clavicle and lower extremities  Height: Ht Readings from Last 1 Encounters:  11/28/13 5\' 1"  (1.549 m)    Weight: Wt Readings from Last 1 Encounters:  11/29/13 98 lb 3.2 oz (44.543 kg)    Ideal Body Weight: 105 lbs  % Ideal Body Weight: 93%  Wt Readings from Last 10 Encounters:  11/29/13 98 lb 3.2 oz (44.543 kg)  11/28/13 98 lb 12.8 oz (44.815 kg)  11/26/13 100 lb 14.4 oz (45.768 kg)  10/29/13 91 lb (41.277 kg)  09/18/13 91 lb 1.6 oz (41.323 kg)  08/27/13 92 lb (41.731 kg)  08/22/13 89 lb (40.37 kg)  08/22/13 89 lb (40.37 kg)  08/07/13 82 lb 11.2 oz (37.512 kg)  06/10/13 83 lb 1.6 oz (37.694 kg)    Usual Body Weight: 90-100 lbs per previous med records  % Usual Body Weight: 100%  BMI:  Body mass index is 18.56 kg/(m^2). Underweight  Estimated Nutritional Needs: Kcal: 1500-1700 Protein: 55-70 gram Fluid: 1200 ml/daily per MD  Skin: WDL  Diet Order: Renal  EDUCATION NEEDS: -Education needs addressed   Intake/Output Summary (Last 24 hours) at 11/29/13 1354 Last data filed at 11/29/13 0500  Gross per 24 hour  Intake    720 ml  Output   1100 ml  Net   -380 ml    Last BM: 6/26   Labs:   Recent Labs Lab 11/26/13 0240 11/28/13 1112 11/29/13 0143 11/29/13 0310 11/29/13 0720  NA 137 137 133*  --  135*  K 4.2 4.2 4.6  --  4.5  CL 102 95* 93*  --  93*  CO2 18* 24 19  --  23  BUN 39*  51* 53*  --  54*  CREATININE 2.67* 2.56* 2.58*  --  2.52*  CALCIUM 8.6 7.9* 7.2*  --  7.6*  MG  --  1.1* 3.0*  --  2.6*  PHOS 3.1 2.2*  --  3.0  --   GLUCOSE 142* 114* 263*  --  238*    CBG (last 3)  No results found for this basename: GLUCAP,  in the last 72 hours  Scheduled Meds: . amLODipine  5 mg Oral Daily  . aspirin EC  81 mg Oral QPM  . calcium carbonate  6 tablet Oral TID  . colchicine  0.3 mg Oral Daily   . vitamin B-12  500 mcg Oral QPM  . feeding supplement (ENSURE)  1 Container Oral TID BM  . FLUoxetine  10 mg Oral Daily  . folic acid  1 mg Oral Daily  . gabapentin  600 mg Oral TID  . heparin  5,000 Units Subcutaneous 3 times per day  . lidocaine  1 patch Transdermal Q24H  . lipase/protease/amylase  1 capsule Oral TID AC  . loratadine  10 mg Oral Daily  . [START ON 12/01/2013] magnesium oxide  400 mg Oral TID PC & HS  . multivitamin with minerals  1 tablet Oral Daily  . pantoprazole  40 mg Oral Daily  . predniSONE  20 mg Oral Q breakfast  . sodium bicarbonate  1,300 mg Oral BID  . thiamine  100 mg Oral Daily  . [START ON 12/04/2013] Vitamin D (Ergocalciferol)  50,000 Units Oral Q7 days    Continuous Infusions:   Past Medical History  Diagnosis Date  . Anemia, B12 deficiency   . History of acute pancreatitis   . Right knee pain     No recent imaging on chart  . 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 Dr. Mancel Bale, [facility]  . Hypertriglyceridemia   . GERD (gastroesophageal reflux disease)   . Vitamin D deficiency   . Subdural hematoma 02/2008    Likely 2/2 trauma from seizure from EtOH withdrawal, chronic in nature, sees Dr. Jerene Bears. Most recent CT head 10/2009 showing stable but persistent hematoma without mass effect.  . History of seizure disorder     Likely 2/2 alcohol abuse  . Hypocalcemia   . Hypomagnesemia   . Failure to thrive in childhood     Unclear etiology  . HTN (hypertension)   . Thrombocytopenia   . Anemia, macrocytic   . Hepatomegaly     On exam  . Joint pain   . Alcohol abuse   . Vitamin D deficiency   . Menopause   . Pancreatitis   . Insomnia   . Hyperlipidemia   . Sinusitis   . Pernicious anemia   . Macrocytic anemia   . 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 Depression   . Fx humeral neck 04/17/2011    Transverse fracture- minimally displaced- managed as outpatient   .  ABNORMAL PAP SMEAR, LGSIL 07/23/2008    Annotation: HPV positive CIN I Dr. Mancel Bale, [facility] Qualifier: Diagnosis of  By: Oretha Ellis    . Pneumonia 05/20/2012  . Seizures     "last one was in 2013; don't know what kind" (08/15/2013)  . Arthritis     "shoulders" (08/15/2013)    Past Surgical History  Procedure Laterality Date  . Cesarean section  1983  . Esophagogastroduodenoscopy  07/11/2011  Procedure: ESOPHAGOGASTRODUODENOSCOPY (EGD);  Surgeon: Beryle Beams, MD;  Location: [facility];  Service: Endoscopy;  Laterality: N/A;  . Colonoscopy  07/11/2011    Procedure: COLONOSCOPY;  Surgeon: Beryle Beams, MD;  Location: [facility] ENDOSCOPY;  Service: Endoscopy;  Laterality: N/A;  . Eye surgery Left     "trauma"  . Right colectomy  08/28/2011  . Esophagogastroduodenoscopy N/A 12/01/2012    Procedure: ESOPHAGOGASTRODUODENOSCOPY (EGD);  Surgeon: Irene Shipper, MD;  Location: [facility];  Service: Endoscopy;  Laterality: N/A;  . Colonoscopy with esophagogastroduodenoscopy (egd) Left 08/21/2013    Procedure: COLONOSCOPY WITH ESOPHAGOGASTRODUODENOSCOPY (EGD);  Surgeon: Beryle Beams, MD;  Location: [facility] ENDOSCOPY;  Service: Endoscopy;  Laterality: Left;    Atlee Abide MS RD [place] Clinical Dietitian [phone]

## 2013-11-29 NOTE — H&P (Signed)
Internal Medicine Attending Admission Note Date: 11/29/2013  Patient name: [name] [name] Medical record number: [id number] Date of birth: 08/04/1955 Age: 58 y.o. Gender: female  I saw and evaluated the patient. I reviewed the resident's note and I agree with the resident's findings and plan as documented in the resident's note, with the following additional comments.  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  Patient was recently hospitalized for management of volume overload and discharged 2 days prior to admission.  She reports that she has some chest pain during the hospitalization, but on the morning of admission the pain became significantly worse.  The pain is aggravated by deep inspiration and relieved by sitting forward.  She has some mild baseline dyspnea.   Physical Exam - key components related to admission:  Filed Vitals:   11/28/13 1800 11/28/13 2100 11/29/13 0311 11/29/13 0941  BP: 152/98 127/91 142/92 131/87  Pulse: 102 109 92 100  Temp: 98.2 F (36.8 C) 98.9 F (37.2 C) 97.9 F (36.6 C)   TempSrc: Oral     Resp: 16 16 16    Weight:   98 lb 3.2 oz (44.543 kg)   SpO2: 93% 91% 91%    General: Alert, no distress Lungs: Bibasilar crackles Heart: Regular, tachycardic; there is a prominent rub audible over the anterior precordium. Abdomen: Bowel sounds present, soft, nontender Extremities: No edema  Lab results:   Basic Metabolic Panel:  Recent Labs  11/28/13 1112 11/29/13 0143 11/29/13 0310 11/29/13 0720  NA 137 133*  --  135*  K 4.2 4.6  --  4.5  CL 95* 93*  --  93*  CO2 24 19  --  23   GLUCOSE 114* 263*  --  238*  BUN 51* 53*  --  54*  CREATININE 2.56* 2.58*  --  2.52*  CALCIUM 7.9* 7.2*  --  7.6*  MG 1.1* 3.0*  --  2.6*  PHOS 2.2*  --  3.0  --     Liver Function Tests:  Recent Labs  11/28/13 1112 11/29/13 0143  AST  --  37  ALT  --  22  ALKPHOS  --  294*  BILITOT  --  <0.2*  PROT  --  7.5  ALBUMIN 2.3* 2.0*     CBC:  Recent Labs  11/28/13 1112 11/29/13 0720  WBC 14.2* 17.1*  HGB 8.9* 7.9*  HCT 27.0* 24.1*  MCV 97.1 98.4  PLT 236 236    Recent Labs  11/29/13 0720  NEUTROABS 14.3*  LYMPHSABS 1.4  MONOABS 1.5*  EOSABS 0.0  BASOSABS 0.0    Cardiac Enzymes:  Recent Labs  11/28/13 2050 11/29/13 0133 11/29/13 0720  TROPONINI <0.30 <0.30 <0.30    BNP:  Recent Labs  11/28/13 2050  PROBNP 17837.0*     CBG:  Recent Labs  11/26/13 1215  GLUCAP 146*    Hemoglobin A1C:  Recent Labs  11/28/13 1152  HGBA1C 5.4    Urine Drug Screen: Drugs of Abuse     Component Value Date/Time   LABOPIA POSITIVE* 11/22/2013 1406   LABOPIA NEG 09/18/2011 0936   COCAINSCRNUR NONE DETECTED 11/22/2013 1406   COCAINSCRNUR NEG 09/18/2011 0936  LABBENZ NONE DETECTED 11/22/2013 1406   LABBENZ NEG 09/18/2011 0936   LABBENZ NEG 04/10/2011 1130   AMPHETMU NONE DETECTED 11/22/2013 1406   AMPHETMU NEG 04/10/2011 1130   THCU NONE DETECTED 11/22/2013 1406   LABBARB NONE DETECTED 11/22/2013 1406   LABBARB NEG 09/18/2011 0936       Urinalysis    Component Value Date/Time   COLORURINE YELLOW 11/29/2013 0203   APPEARANCEUR CLEAR 11/29/2013 0203   LABSPEC 1.010 11/29/2013 0203   PHURINE 6.0 11/29/2013 0203   GLUCOSEU NEGATIVE 11/29/2013 0203   GLUCOSEU NEG mg/dL 08/15/2007 2117   HGBUR NEGATIVE 11/29/2013 0203   BILIRUBINUR NEGATIVE 11/29/2013 0203   KETONESUR NEGATIVE 11/29/2013 0203   PROTEINUR NEGATIVE 11/29/2013 0203   UROBILINOGEN 0.2 11/29/2013 0203   NITRITE NEGATIVE 11/29/2013 0203   LEUKOCYTESUR NEGATIVE 11/29/2013 0203      Imaging results:   Dg Chest 2 View  11/28/2013   CLINICAL DATA:  increased sob  EXAM: CHEST  2 VIEW  COMPARISON:  Prior radiograph from 11/26/2013  FINDINGS: Cardiomegaly is stable as compared to prior exam.  Lungs are mildly hypoinflated. There is persistent diffuse pulmonary edema, slightly improved from prior. Bilateral pleural effusions are also slightly improved. No definite new focal infiltrate. There is no pneumothorax.  Chronic fracture deformity of the proximal left humerus noted, unchanged. No acute osseous abnormality.  IMPRESSION: Slight interval improvement in diffuse pulmonary edema with decreased size of bilateral pleural effusions, consistent with improving CHF.   Electronically Signed   By: Jeannine Boga M.D.   On: 11/28/2013 21:47    Other results: EKG 6/26: Sinus tachycardia; septal infarct , age undetermined EKG 6/27: Normal sinus rhythm; anterior infarct , age undetermined  Assessment & Plan by Problem:  1.  Probable acute pericarditis.  Patient presents with pleuritic left chest pain aggravated by inspiration and relieved by sitting forward; she has a new prominent pericardial friction rub on exam.  Although her EKG does not show findings typical of acute pericarditis, her presentation and exam are consistent with that diagnosis.  The differential diagnosis includes idiopathic, viral, uremic, or other cause.  Given her chronic kidney disease, NSAID medications are contraindicated.  Patient was started on low-dose prednisone and reports improvement in her symptoms overnight.  Plans include continue prednisone; labs including a quantiferon gold assay, HIV if not recently done, ESR, and ANA; 2D echocardiogram; cardiology consult.  2.  Diastolic heart failure.  Patient tolerated initial Lasix diuresis, and her JVD at a basal crackles have improved.  Would hold on further diuresis pending 2-D echocardiogram.  3.  Other problems and plans as per the resident physician's note.

## 2013-11-29 NOTE — Progress Notes (Addendum)
Night Float Interim Progress Note  Paged by RN regarding elevated Mg 3.0.  Stat EKG ordered and reviewed.  VSS and patient resting.  Went to see the patient.  She was awake in bed watching television.  She says she is feeling better than at admission and her chest pain is improving.  She denies breathing difficulty.    Vitals:  T 97.48F, RR 16, HR 92, BP 142/59mmHg, SpO2 91% General:  In bed in NAD, cooperative with exam Heart:  RRR, no murmurs or gallops, + rub Lungs:  Bibasilar rales, no wheezes, no respiratory distress Abdomen:  +BS, soft, NT, ND Ext:  Warm and well perfused, 1+ pitting edema B/L, non-tender extremities Neuro:  AAO x 4, responding appropriately, CN grossly intact, strength and sensation intact upper and lower extremities, she is able to move extremities voluntarily Psych:  Mood, affect and behavior are normal. 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  Mg was low, 1.1 at admission and she was given 4g IV Mg.  AM BMP reveals Mg 3.0.  Elevation is iatrogenic 2/2 to IV Mg supplementation in the setting of CKD4.  Significant effects are usually not seen until levels above 4.8mg /dL and include nausea, headache, hypotension, bradycardia and EKG changes.  The patient has no EKG changes, vitals are stable, her exam is unchanged from admission and she is feeling better.   - IV Lasix 20mg  once (to facilitate Mg removal in the setting of moderate renal impairment, GFR 15-45) - d/c po Mg supplement until levels have improved - no IV fluids given pulmonary edema - repeat AM Mg  Duwaine Maxin DO IMT[facility] PGY1 Night Float Intern

## 2013-11-29 NOTE — Consult Note (Signed)
CARDIOLOGY CONSULT NOTE  [name] MRN: [id number] DOB/AGE: [birth year] 58 y.o.  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 She had been hospitalized for four days and discharged on 6/24 for acute bronchitis and acute on chronic diastolic heart failure and was started on Lasix 40 mg daily. She then saw Dr. Eulas Post in clinic on 6/26 and complained of persistent chest pain. She said it began approximately 2 weeks ago and she attributed it to her cough. She says it is relieved when she sits up and bends forward. She has also had shortness of breath, which has improved after receiving IV Lasix. An echocardiogram performed on 6/22 demonstrated normal LV systolic function, EF 85-27%, grade II diastolic dysfunction, and what was described as a small pericardial effusion without evidence for hemodynamic compromise. A repeat echocardiogram today demonstrates what was described as a large pericardial effusion without evidence for tamponade, but also notes that there has been no significant change in size of effusion. She has since been started on steroids and colchicine and her chest pain has improved. ESR 141, BNP 17,837, troponins normal, CRP 5.5. ECG this am demonstrates normal sinus rhythm, HR 96 bpm.   Allergies  Allergen Reactions  . Amitriptyline Hcl Swelling    In the face.  . Doxycycline Hyclate Itching    Feels like something crawling under her skin    Current Facility-Administered Medications  Medication Dose Route Frequency Provider Last Rate Last Dose  . acetaminophen (TYLENOL) tablet 500 mg  500 mg Oral Q4H PRN Cresenciano Genre, MD   500 mg at 11/29/13 1015  . amLODipine (NORVASC) tablet 5 mg  5 mg Oral Daily Cresenciano Genre, MD   5 mg at 11/29/13 0943  . aspirin EC tablet 81 mg  81 mg Oral QPM Cresenciano Genre, MD   81 mg at 11/28/13 2146  . calcium carbonate (TUMS - dosed in mg elemental calcium) chewable tablet 1,200 mg of elemental calcium  6 tablet Oral TID Cresenciano Genre, MD   1,200 mg of elemental calcium at 11/29/13 0942  . colchicine tablet 0.3 mg  0.3 mg Oral Daily Cresenciano Genre, MD   0.3 mg at 11/29/13 0943  . cyanocobalamin tablet 500 mcg  500 mcg Oral QPM Axel Filler, MD   500 mcg at 11/28/13 2147  . feeding supplement (ENSURE) (ENSURE) pudding 1 Container  1 Container Oral TID BM Hazle Coca, RD   1 Container at 11/29/13 1400  . FLUoxetine (PROZAC) capsule 10 mg  10 mg Oral Daily Cresenciano Genre, MD   10 mg at 11/29/13 0943  . folic acid (FOLVITE) tablet 1 mg  1 mg Oral Daily Cresenciano Genre, MD   1 mg at 11/29/13 [phone]  . gabapentin (NEURONTIN) capsule 600 mg  600 mg Oral TID Cresenciano Genre, MD   600 mg at 11/29/13 0942  . guaiFENesin-codeine 100-10 MG/5ML solution 5 mL  5 mL Oral Q4H PRN Cresenciano Genre, MD      . heparin injection 5,000 Units  5,000 Units Subcutaneous 3 times per day Jessee Avers, MD   5,000 Units at 11/29/13 1345  . lidocaine (LIDODERM) 5 % 1 patch  1 patch Transdermal Q24H Olivia Mackie  Starr Sinclair, MD   1 patch at 11/28/13 2346  . lipase/protease/amylase (CREON-12/PANCREASE) capsule 1 capsule  1 capsule Oral TID AC Cresenciano Genre, MD   1 capsule at 11/29/13 1234  . loratadine (CLARITIN) tablet 10 mg  10 mg Oral Daily Cresenciano Genre, MD   10 mg at 11/29/13 [phone]  . [START ON 12/01/2013] magnesium oxide (MAG-OX) tablet 400 mg  400 mg Oral TID PC & HS Cresenciano Genre, MD      . multivitamin with minerals tablet 1 tablet  1 tablet Oral Daily Cresenciano Genre, MD   1 tablet at 11/29/13 [phone]  . pantoprazole (PROTONIX) EC tablet 40 mg  40 mg Oral Daily Cresenciano Genre, MD   40 mg at 11/29/13 1015  . predniSONE (DELTASONE) tablet 20 mg  20 mg Oral Q breakfast Cresenciano Genre, MD   20 mg at  11/29/13 0956  . sodium bicarbonate tablet 1,300 mg  1,300 mg Oral BID Cresenciano Genre, MD   1,300 mg at 11/29/13 0943  . thiamine (VITAMIN B-1) tablet 100 mg  100 mg Oral Daily Cresenciano Genre, MD   100 mg at 11/29/13 8546  . [START ON 12/04/2013] Vitamin D (Ergocalciferol) (DRISDOL) capsule 50,000 Units  50,000 Units Oral Q7 days Cresenciano Genre, MD        Past Medical History  Diagnosis Date  . Anemia, B12 deficiency   . History of acute pancreatitis   . Right knee pain     No recent imaging on chart  . 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 Dr. Mancel Bale, [facility]  . Hypertriglyceridemia   . GERD (gastroesophageal reflux disease)   . Vitamin D deficiency   . Subdural hematoma 02/2008    Likely 2/2 trauma from seizure from EtOH withdrawal, chronic in nature, sees Dr. Jerene Bears. Most recent CT head 10/2009 showing stable but persistent hematoma without mass effect.  . History of seizure disorder     Likely 2/2 alcohol abuse  . Hypocalcemia   . Hypomagnesemia   . Failure to thrive in childhood     Unclear etiology  . HTN (hypertension)   . Thrombocytopenia   . Anemia, macrocytic   . Hepatomegaly     On exam  . Joint pain   . Alcohol abuse   . Vitamin D deficiency   . Menopause   . Pancreatitis   . Insomnia   . Hyperlipidemia   . Sinusitis   . Pernicious anemia   . Macrocytic anemia   . 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 Depression   . Fx humeral neck 04/17/2011    Transverse fracture- minimally displaced- managed as outpatient   . ABNORMAL PAP SMEAR, LGSIL 07/23/2008    Annotation: HPV positive CIN I Dr. Mancel Bale, [facility] Qualifier: Diagnosis of  By: Oretha Ellis    . Pneumonia 05/20/2012  . Seizures     "last one was in 2013; don't know what kind" (08/15/2013)  . Arthritis     "shoulders" (08/15/2013)    Past Surgical History  Procedure Laterality Date  . Cesarean section   1983  . Esophagogastroduodenoscopy  07/11/2011    Procedure: ESOPHAGOGASTRODUODENOSCOPY (EGD);  Surgeon: Beryle Beams, MD;  Location: [facility];  Service: Endoscopy;  Laterality: N/A;  . Colonoscopy  07/11/2011    Procedure: COLONOSCOPY;  Surgeon: Beryle Beams, MD;  Location:  [facility] ENDOSCOPY;  Service: Endoscopy;  Laterality: N/A;  . Eye surgery Left     "trauma"  . Right colectomy  08/28/2011  . Esophagogastroduodenoscopy N/A 12/01/2012    Procedure: ESOPHAGOGASTRODUODENOSCOPY (EGD);  Surgeon: Irene Shipper, MD;  Location: [facility];  Service: Endoscopy;  Laterality: N/A;  . Colonoscopy with esophagogastroduodenoscopy (egd) Left 08/21/2013    Procedure: COLONOSCOPY WITH ESOPHAGOGASTRODUODENOSCOPY (EGD);  Surgeon: Beryle Beams, MD;  Location: [facility] ENDOSCOPY;  Service: Endoscopy;  Laterality: Left;    History   Social History  . Marital Status: Divorced    Spouse Name: N/A    Number of Children: N/A  . Years of Education: N/A   Occupational History  . Not on file.   Social History Main Topics  . Smoking status: Former Smoker -- 0.50 packs/day for 40 years    Types: Cigarettes    Quit date: 09/20/2010  . Smokeless tobacco: Never Used  . Alcohol Use: Yes     Comment: "sips"   . Drug Use: Yes    Special: Marijuana, Cocaine     Comment: 08/15/2013 "last drug use was in 2012"  . Sexual Activity: Yes   Other Topics Concern  . Not on file   Social History Narrative   Lives with her significant other and 2 grandchildren. 1 child   Has 7 brothers and 4 sisters, 1 twin sister.   Unemployed, worked in [employer].    Abuses alcohol-drinks 1 glass of wine daily    No drug use. Former cigarette use quit 1.5 years ago.     11 th grade education              No family history of premature CAD in 1st degree relatives.  Prior to Admission medications   Medication Sig Start Date End Date Taking? Authorizing Provider  amLODipine (NORVASC) 5 MG tablet Take 1 tablet (5 mg total) by  mouth daily. 11/28/13 11/28/14 Yes Otho Bellows, MD  aspirin EC 81 MG tablet Take 1 tablet (81 mg total) by mouth every evening. 01/24/13  Yes Otho Bellows, MD  calcium carbonate (TUMS) 500 MG chewable tablet Chew 6 tablets (1,200 mg of elemental calcium total) by mouth 3 (three) times daily. For bone health 11/11/12  Yes Encarnacion Slates, NP  cetirizine (ZYRTEC) 10 MG tablet Take 1 tablet (10 mg total) by mouth daily. 01/30/13  Yes Otho Bellows, MD  feeding supplement, ENSURE COMPLETE, (ENSURE COMPLETE) LIQD Take 237 mLs by mouth 3 (three) times daily between meals. 08/27/13  Yes Jeralene Huff, MD  FLUoxetine (PROZAC) 10 MG capsule Take 1 capsule (10 mg total) by mouth daily. For depression 02/11/13  Yes Otho Bellows, MD  folic acid (FOLVITE) 1 MG tablet Take 1 tablet (1 mg total) by mouth daily. For folic acid replacement 11/06/13  Yes Sid Falcon, MD  furosemide (LASIX) 40 MG tablet Take 1 tablet (40 mg total) by mouth daily. 11/26/13  Yes Corky Sox, MD  gabapentin (NEURONTIN) 300 MG capsule Take 2 capsules (600 mg total) by mouth 3 (three) times daily. For anxiety/pain control 07/02/13  Yes Otho Bellows, MD  lipase/protease/amylase (CREON-12/PANCREASE) 12000 UNITS CPEP capsule Take 1 capsule by mouth 3 (three) times daily before meals. 04/10/13  Yes Otho Bellows, MD  magnesium oxide (MAG-OX) 400 MG tablet Take 1 tablet (400 mg total) by mouth 4 (four) times daily. 09/18/13  Yes Otho Bellows, MD  Multiple Vitamin (MULTIVITAMIN WITH MINERALS) TABS  tablet Take 1 tablet by mouth daily. For vitamin replacement 04/10/13  Yes Otho Bellows, MD  omeprazole (PRILOSEC) 40 MG capsule Take 1 capsule (40 mg total) by mouth daily. 02/11/13  Yes Otho Bellows, MD  predniSONE (DELTASONE) 20 MG tablet Take 1 tablet (20 mg total) by mouth daily with breakfast. 11/26/13  Yes Corky Sox, MD  sodium bicarbonate 650 MG tablet Take 2 tablets (1,300 mg total) by mouth 2 (two) times daily. 11/26/13  Yes  Corky Sox, MD  thiamine (VITAMIN B-1) 100 MG tablet Take 1 tablet (100 mg total) by mouth daily. For low thiamine   Yes Otho Bellows, MD  vitamin B-12 (CYANOCOBALAMIN) 250 MCG tablet Take 1 tablet (250 mcg total) by mouth every evening. 04/10/13  Yes Otho Bellows, MD  Vitamin D, Ergocalciferol, (DRISDOL) 50000 UNITS CAPS capsule Take 1 capsule (50,000 Units total) by mouth every 7 (seven) days. Thursdays: For bone health 08/08/13  Yes Otho Bellows, MD  guaiFENesin-codeine 100-10 MG/5ML syrup Take 5 mLs by mouth every 4 (four) hours as needed for cough. 11/26/13   Corky Sox, MD  predniSONE (DELTASONE) 10 MG tablet Take 1 tablet (10 mg total) by mouth daily with breakfast. 11/26/13   Corky Sox, MD     Review of systems complete and found to be negative unless listed above in HPI     Physical exam Blood pressure 127/85, pulse 106, temperature 98.3 F (36.8 C), temperature source Oral, resp. rate 18, weight 98 lb 3.2 oz (44.543 kg), SpO2 93.00%. General: NAD Neck: JVP 9-10 cm H2O, no thyromegaly or thyroid nodule.  Lungs: Bibasilar crackles. CV: Nondisplaced PMI. Tachycardic, HR low 100 bpm range, normal S1/S2, no S3/S4, no murmur. No rub was appreciated. No peripheral edema.  No carotid bruit.  Normal pedal pulses.  Abdomen: Soft, nontender, no hepatosplenomegaly, no distention.  Skin: Intact without lesions or rashes.  Neurologic: Alert and oriented x 3.  Psych: Normal affect. Extremities: No clubbing or cyanosis.  HEENT: Normal.   Labs:   Lab Results  Component Value Date   WBC 17.1* 11/29/2013   HGB 7.9* 11/29/2013   HCT 24.1* 11/29/2013   MCV 98.4 11/29/2013   PLT 236 11/29/2013    Recent Labs Lab 11/29/13 0143 11/29/13 0720  NA 133* 135*  K 4.6 4.5  CL 93* 93*  CO2 19 23  BUN 53* 54*  CREATININE 2.58* 2.52*  CALCIUM 7.2* 7.6*  PROT 7.5  --   BILITOT <0.2*  --   ALKPHOS 294*  --   ALT 22  --   AST 37  --   GLUCOSE 263* 238*   Lab Results  Component  Value Date   CKTOTAL 327* 12/01/2012   CKMB 1.8 12/06/2007   TROPONINI <0.30 11/29/2013    Lab Results  Component Value Date   CHOL 99 12/04/2012   CHOL 110 03/26/2012   CHOL 103 12/20/2010   Lab Results  Component Value Date   HDL 30* 03/26/2012   HDL 40 12/20/2010   HDL 34* 11/03/2008   Lab Results  Component Value Date   LDLCALC 20 03/26/2012   LDLCALC 32 12/20/2010   LDLCALC 59 11/03/2008   Lab Results  Component Value Date   TRIG 301* 03/26/2012   TRIG 157* 12/20/2010   TRIG 329* 11/03/2008   Lab Results  Component Value Date   CHOLHDL 3.7 03/26/2012   CHOLHDL 2.6 12/20/2010   CHOLHDL 4.7 Ratio 11/03/2008  No results found for this basename: LDLDIRECT         Studies: Dg Chest 2 View  11/28/2013   CLINICAL DATA:  increased sob  EXAM: CHEST  2 VIEW  COMPARISON:  Prior radiograph from 11/26/2013  FINDINGS: Cardiomegaly is stable as compared to prior exam.  Lungs are mildly hypoinflated. There is persistent diffuse pulmonary edema, slightly improved from prior. Bilateral pleural effusions are also slightly improved. No definite new focal infiltrate. There is no pneumothorax.  Chronic fracture deformity of the proximal left humerus noted, unchanged. No acute osseous abnormality.  IMPRESSION: Slight interval improvement in diffuse pulmonary edema with decreased size of bilateral pleural effusions, consistent with improving CHF.   Electronically Signed   By: Jeannine Boga M.D.   On: 11/28/2013 21:47    ASSESSMENT AND PLAN:  1. Acute pericarditis: She is improving symptomatically with prednisone and colchicine. Etiology includes viral given recent exacerbation of bronchitis in setting of pericardial effusion. Currently being evaluated for autoimmune etiology given family history of SLE. ANA pending. I did not appreciate a rub and I suspect she has had decreased inflammation after the institution of prednisone.  2. Acute on chronic diastolic heart failure: Has bibasilar rales. I  would resume Lasix 40 mg daily (ordered). Her symptoms are improving.  3. HTN: Controlled on present therapy.  4. Pericardial effusion: I spoke with Dr. Wynonia Lawman who said it is in fact a large effusion, but there is no RA or RV inversion and the IVC collapses. Hence, there is no evidence of tamponade physiology. This may also improve with decreased pericardial inflammation with steroids. Etiology remains the same (viral, bacterial, autoimmune). She will need a repeat limited echocardiogram in 3 days to assess for interval improvement.   Signed: Kate Sable, M.D., F.A.C.C.  11/29/2013, 2:09 PM

## 2013-11-30 ENCOUNTER — Encounter (HOSPITAL_COMMUNITY): Payer: Self-pay | Admitting: *Deleted

## 2013-11-30 DIAGNOSIS — G40909 Epilepsy, unspecified, not intractable, without status epilepticus: Secondary | ICD-10-CM

## 2013-11-30 LAB — BASIC METABOLIC PANEL
BUN: 53 mg/dL — AB (ref 6–23)
CALCIUM: 7.6 mg/dL — AB (ref 8.4–10.5)
CO2: 24 mEq/L (ref 19–32)
CREATININE: 2.47 mg/dL — AB (ref 0.50–1.10)
Chloride: 91 mEq/L — ABNORMAL LOW (ref 96–112)
GFR calc non Af Amer: 21 mL/min — ABNORMAL LOW (ref >90)
GFR, EST AFRICAN AMERICAN: 24 mL/min — AB (ref >90)
Glucose, Bld: 152 mg/dL — ABNORMAL HIGH (ref 70–99)
Potassium: 3.7 mEq/L (ref 3.7–5.3)
Sodium: 134 mEq/L — ABNORMAL LOW (ref 137–147)

## 2013-11-30 LAB — CULTURE, BLOOD (ROUTINE X 2)
CULTURE: NO GROWTH
Culture: NO GROWTH

## 2013-11-30 LAB — MAGNESIUM: Magnesium: 1.9 mg/dL (ref 1.5–2.5)

## 2013-11-30 MED ORDER — FUROSEMIDE 10 MG/ML IJ SOLN
40.0000 mg | Freq: Once | INTRAMUSCULAR | Status: AC
  Administered 2013-11-30: 40 mg via INTRAVENOUS
  Filled 2013-11-30: qty 4

## 2013-11-30 NOTE — Care Management Note (Addendum)
    Page 1 of 2   12/09/2013     3:51:47 PM CARE MANAGEMENT NOTE 12/09/2013  Patient:  [name] [name], [name] [name]   Account Number:  [id number]  Date Initiated:  11/30/2013  Documentation initiated by:  [facility]  Subjective/Objective Assessment:   adm: Pleuritic left chest pain     Action/Plan:   discharge plannning   Anticipated DC Date:  12/10/2013   Anticipated DC Plan:  [place]  CM consult      [facility] Choice  HOME HEALTH  Resumption Of Svcs/PTA Provider   Choice offered to / List presented to:  C-1 Patient        [place] arranged  HH-1 RN  [place] agency  [place].   Status of service:  Completed, signed off Medicare Important Message given?  YES (If response is "NO", the following Medicare IM given date fields will be blank) Date Medicare IM given:  12/05/2013 Medicare IM given by:  MAYO,HENRIETTA Date Additional Medicare IM given:  12/08/2013 Additional Medicare IM given by:  Ellan Lambert  Discharge Disposition:  [place]  Per UR Regulation:  Reviewed for med. necessity/level of care/duration of stay  If discussed at [place] of Stay Meetings, dates discussed:   12/04/2013  12/09/2013    Comments:  12/09/13 Ellan Lambert, RN, BSN [phone] Pt for dc home today with family and [facility] care as prior to admission.  Notified [facility] of dc date and resumption orders for [facility] care.  Start of care 24-48h post dc date.  Pt denies any DME needs.  No cont oxygen needed.  12/08/13 Ellan Lambert, RN, BSN [phone] Pt progessing well s/p pericardial window.  Pt states she has home O2 at home with portable tanks, if needed at dc; she is active with [facility] for home oxygen.  States lives with friend, daughter, and grandchildren.  Will follow progress.  12/05/13 1130 Sandy Level MSN BSN CCM To 2S s/p pericardial window.  Off BiPAP, on O2 2L/min [place], to ambulate with staff.  12-03-13  [address], [place] [phone] Plan for pericardiocentesis 12-03-13. CM will continue to monitor.  12/01/13- 1600- Marvetta Gibbons RN, BSN [phone] Met with pt at bedside- pt was active with [facility] prior to admission- confirmed with pt that she wants to continue services with them- orders to resume RN/aide in chart- referral called to [facility] with [facility] to resume services with a planned d/c date of 12/02/13- also received referral regarding medication needs- pt has insurance and therefor does not qualify for assistance- in talking with pt she uses mail order for most meds- and [facility] if she has something that is only short term- she has already contacted [facility] regarding scripts that have been sent and her cost total is $7- she reports that this is not a problem for her.  11/30/13 08:10 Cm met with pt in room to offer choice of [place] agency.  Pt chooses for HHRN/aide.  Address and contact information verified with pt.  Referral texted to [facility] rep, Kristen.  No other CM needs were communicated.  Mariane Masters, BSN, [place].

## 2013-11-30 NOTE — Progress Notes (Signed)
Subjective: Patient seen at bedside this AM. No acute events overnight. Still complaining of SOB. Mild JVD on exam, still w/ bibasilar crackles. No rub appreciated.   Objective: Vital signs in last 24 hours: Filed Vitals:   11/29/13 0941 11/29/13 1330 11/29/13 2042 11/30/13 0616  BP: 131/87 127/85 141/89 137/85  Pulse: 100 106 107 103  Temp:  98.3 F (36.8 C) 97.7 F (36.5 C) 98 F (36.7 C)  TempSrc:  Oral Oral Oral  Resp:  18 18 18   Weight:    96 lb 14.4 oz (43.954 kg)  SpO2:  93% 97% 96%   Weight change: -1 lb 4.8 oz (-0.59 kg)  Intake/Output Summary (Last 24 hours) at 11/30/13 0853 Last data filed at 11/30/13 7262  Gross per 24 hour  Intake      6 ml  Output   1600 ml  Net  -1594 ml   Physical Exam: General: Alert, cooperative, NAD. HEENT: PERRL, EOMI. Moist mucus membranes Neck: Full range of motion without pain, supple, no lymphadenopathy or carotid bruits. Mild JVD present.  Lungs: Normal work of respiration. Bibasilar crackles. No wheezes. Heart: Tachycardic, RR. No murmurs, gallops, or rubs appreciated.  Abdomen: Soft, non-tender, non-distended, BS + Extremities: No cyanosis, clubbing, or edema Neurologic: Alert & oriented X3, cranial nerves II-XII intact, strength grossly intact, sensation intact to light touch   Lab Results: Basic Metabolic Panel:  Recent Labs Lab 11/28/13 1112  11/29/13 0310 11/29/13 0720 11/30/13 0312  NA 137  < >  --  135* 134*  K 4.2  < >  --  4.5 3.7  CL 95*  < >  --  93* 91*  CO2 24  < >  --  23 24  GLUCOSE 114*  < >  --  238* 152*  BUN 51*  < >  --  54* 53*  CREATININE 2.56*  < >  --  2.52* 2.47*  CALCIUM 7.9*  < >  --  7.6* 7.6*  MG 1.1*  < >  --  2.6* 1.9  PHOS 2.2*  --  3.0  --   --   < > = values in this interval not displayed. Liver Function Tests:  Recent Labs Lab 11/28/13 1112 11/29/13 0143  AST  --  37  ALT  --  22  ALKPHOS  --  294*  BILITOT  --  <0.2*  PROT  --  7.5  ALBUMIN 2.3* 2.0*    CBC:  Recent Labs Lab 11/28/13 1112 11/29/13 0720  WBC 14.2* 17.1*  NEUTROABS  --  14.3*  HGB 8.9* 7.9*  HCT 27.0* 24.1*  MCV 97.1 98.4  PLT 236 236   Cardiac Enzymes:  Recent Labs Lab 11/28/13 2050 11/29/13 0133 11/29/13 0720  TROPONINI <0.30 <0.30 <0.30   BNP:  Recent Labs Lab 11/28/13 2050  PROBNP 17837.0*   CBG:  Recent Labs Lab 11/25/13 1401 11/25/13 1605 11/25/13 1935 11/26/13 0812 11/26/13 1215  GLUCAP 271* 294* 335* 114* 146*   Hemoglobin A1C:  Recent Labs Lab 11/28/13 1152  HGBA1C 5.4   Anemia Panel:  Recent Labs Lab 11/26/13 1030  FERRITIN 1035*  TIBC 138*  IRON 102   Urine Drug Screen: Drugs of Abuse     Component Value Date/Time   LABOPIA POSITIVE* 11/22/2013 1406   LABOPIA NEG 09/18/2011 0936   COCAINSCRNUR NONE DETECTED 11/22/2013 1406   COCAINSCRNUR NEG 09/18/2011 0936   LABBENZ NONE DETECTED 11/22/2013 1406   LABBENZ NEG 09/18/2011 0936  LABBENZ NEG 04/10/2011 1130   AMPHETMU NONE DETECTED 11/22/2013 1406   AMPHETMU NEG 04/10/2011 1130   THCU NONE DETECTED 11/22/2013 1406   LABBARB NONE DETECTED 11/22/2013 1406   LABBARB NEG 09/18/2011 0936    Urinalysis:  Recent Labs Lab 11/29/13 0203  COLORURINE YELLOW  LABSPEC 1.010  PHURINE 6.0  GLUCOSEU NEGATIVE  HGBUR NEGATIVE  BILIRUBINUR NEGATIVE  KETONESUR NEGATIVE  PROTEINUR NEGATIVE  UROBILINOGEN 0.2  NITRITE NEGATIVE  LEUKOCYTESUR NEGATIVE    Micro Results: Recent Results (from the past 240 hour(s))  CULTURE, BLOOD (ROUTINE X 2)     Status: None   Collection Time    11/24/13  8:30 AM      Result Value Ref Range Status   Specimen Description BLOOD RIGHT ARM   Final   Special Requests BOTTLES DRAWN AEROBIC ONLY 1CC   Final   Culture  Setup Time     Final   Value: 11/24/2013 14:18     Performed at [facility]   Culture     Final   Value:        BLOOD CULTURE RECEIVED NO GROWTH TO DATE CULTURE WILL BE HELD FOR 5 DAYS BEFORE ISSUING A FINAL NEGATIVE REPORT      Performed at [facility]   Report Status PENDING   Incomplete  CULTURE, BLOOD (ROUTINE X 2)     Status: None   Collection Time    11/24/13  8:34 AM      Result Value Ref Range Status   Specimen Description BLOOD LEFT HAND   Final   Special Requests BOTTLES DRAWN AEROBIC ONLY 3CC   Final   Culture  Setup Time     Final   Value: 11/24/2013 14:16     Performed at [facility]   Culture     Final   Value:        BLOOD CULTURE RECEIVED NO GROWTH TO DATE CULTURE WILL BE HELD FOR 5 DAYS BEFORE ISSUING A FINAL NEGATIVE REPORT     Performed at [facility]   Report Status PENDING   Incomplete  MRSA PCR SCREENING     Status: None   Collection Time    11/24/13 11:28 AM      Result Value Ref Range Status   MRSA by PCR NEGATIVE  NEGATIVE Final   Comment:            The GeneXpert MRSA Assay (FDA     approved for NASAL specimens     only), is one component of a     comprehensive MRSA colonization     surveillance program. It is not     intended to diagnose MRSA     infection nor to guide or     monitor treatment for     MRSA infections.   Studies/Results: Dg Chest 2 View  11/28/2013   CLINICAL DATA:  increased sob  EXAM: CHEST  2 VIEW  COMPARISON:  Prior radiograph from 11/26/2013  FINDINGS: Cardiomegaly is stable as compared to prior exam.  Lungs are mildly hypoinflated. There is persistent diffuse pulmonary edema, slightly improved from prior. Bilateral pleural effusions are also slightly improved. No definite new focal infiltrate. There is no pneumothorax.  Chronic fracture deformity of the proximal left humerus noted, unchanged. No acute osseous abnormality.  IMPRESSION: Slight interval improvement in diffuse pulmonary edema with decreased size of bilateral pleural effusions, consistent with improving CHF.   Electronically Signed   By: Jeannine Boga  M.D.   On: 11/28/2013 21:47   Medications: I have reviewed the patient's current medications. Scheduled  Meds: . amLODipine  5 mg Oral Daily  . aspirin EC  81 mg Oral QPM  . calcium carbonate  6 tablet Oral TID  . colchicine  0.3 mg Oral Daily  . vitamin B-12  500 mcg Oral QPM  . feeding supplement (ENSURE)  1 Container Oral TID BM  . FLUoxetine  10 mg Oral Daily  . folic acid  1 mg Oral Daily  . furosemide  40 mg Oral Daily  . gabapentin  600 mg Oral TID  . heparin  5,000 Units Subcutaneous 3 times per day  . lidocaine  1 patch Transdermal Q24H  . lipase/protease/amylase  1 capsule Oral TID AC  . loratadine  10 mg Oral Daily  . [START ON 12/01/2013] magnesium oxide  400 mg Oral TID PC & HS  . multivitamin with minerals  1 tablet Oral Daily  . pantoprazole  40 mg Oral Daily  . predniSONE  20 mg Oral Q breakfast  . sodium bicarbonate  1,300 mg Oral BID  . thiamine  100 mg Oral Daily  . [START ON 12/04/2013] Vitamin D (Ergocalciferol)  50,000 Units Oral Q7 days   Continuous Infusions:  PRN Meds:.acetaminophen, guaiFENesin-codeine  Assessment/Plan: [name] [name] is a 58 y.o. 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   Acute pericarditis- Patient still complaining of mild chest pain this AM. Still w/ JVD on exam, no friction rub appreciated. Yesterday, ECHO was performed, showed a large pericardial effusion w/  no evidence of hemodynamic compromise. Unchanged from prior ECHO on 11/24/13 (which reported SMALL pericardial effusion.). Of note, patient also w/ trivial pericardial effusion on ECHO from 12/2012. Seen by cardiology yesterday, feel that patient is improving after initiation of Prednisone treatment. Still unclear etiology, however, most likely viral in nature given recent pharyngitis/acute bronchitis.  -Cardiology following; appreciate recs -Continue Prednisone 20 mg qd for 2 weeks total  (11/28/2013 - 12/11/2013), followed by taper regimen: 17.5 mg qd for 2 weeks (12/12/2013 - 12/26/2013), then 15 mg qd for 2 weeks (12/27/2013 - 01/10/2014), then 12.5 mg qd for 2 weeks (01/11/2014 - 02/25/2014),  then 10 mg qd for 2 weeks (01/26/2014 - 02/09/2014),  then 7.5 mg qd for 2 weeks (02/10/2014 - 02/24/2014),  then 5 mg qd for 2 weeks (02/25/2014 - 03/11/2014),   then 2.5 mg qd for 2 weeks (03/12/2014 - 03/26/2014), THEN STOP.  -Continue colchicine 0.3 mg qd for 3 months (end date 02/28/2014). -Quantiferon pending  -Patient to follow up w/ cardiology early this week for repeat ECHO.   Acute on Chronic dCHF- Improved. Still w/ bibasilar crackles and mild JVD. On Lasix 40 mg qd.  -Continue lasix 40 mg qd as per cardiology -Daily weights and strict I&Os   Hypomagnesemia- 1.9 this AM. Supposed to take Mag Oxide 400 mg qid at home, however, very likely non-compliant with this. Admission Mag of 1.1.  -On Mag Oxide 400 mg tid during admission. Will discharge on Mag Oxide 400 mg bid for better compliance.  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 Found to be higher during her recent admission and has remained elevated at ~2.6. Has a outpatient f/u with Dr Justin Mend on 7/1. Cr trend as follows:   Recent Labs Lab 11/26/13  0240 11/28/13 1112 11/29/13 0143 11/29/13 0720 11/30/13 0312  CREATININE 2.67* 2.56* 2.58* 2.52* 2.47*  -Continue Lasix as above.  -NaHCO3 as below -Outpatient renal follow up  AG metabolic acidosis- Most likely 2/2 worsening CKD.   -Continue NaHCO3 1300 mg bid  Anemia of chronic disease- Hgb 8.9 at admission, close to baseline of 8-10.  -Continue to monitor.   HTN- Normotensive -Continue Amlodipine 5 mg po qd  Diet- Renal w/1212mL fluid restriction   DVT/PE PPx- Heparin [place]  Dispo: Anticipated discharge today w/ [facility] for medication management and assistance w/ ADL's/AIDL's.   The patient does have a current PCP Otho Bellows, MD) and does need an [facility] hospital  follow-up appointment after discharge.  The patient does not have transportation limitations that hinder transportation to clinic appointments.  .Services Needed at time of discharge: Y = Yes, Blank = No PT:   OT:   RN: Y  Equipment:   Other:     LOS: 2 days   Corky Sox, MD 11/30/2013, 8:53 AM

## 2013-11-30 NOTE — Discharge Instructions (Signed)
1. You have a follow up appointment as scheduled below:  [facility]  We will contact you to arrange for echocardiogram early next week.  [place]   [facility] will call you w/ an appointment for some time later this week.  [address] [place] [address] [phone]  2. Please take all medications as prescribed.   Please take Prednisone 20 mg daily   Please take Colchicine 0.3 mg daily  3. If you have worsening of your symptoms or new symptoms arise, please call the clinic [phone]), or go to the ER immediately if symptoms are severe.  You have done a great job in taking all your medications. I appreciate it very much. Please continue doing that.   Pericarditis Pericarditis is swelling (inflammation) of the pericardium. The pericardium is a thin, double-layered, fluid-filled tissue sac that surrounds the heart. The purpose of the pericardium is to contain the heart in the chest cavity and keep the heart from overexpanding. Different types of pericarditis can occur, such as:  Acute pericarditis. Inflammation can develop suddenly in acute pericarditis.  Chronic pericarditis. Inflammation develops gradually and is long-lasting in chronic pericarditis.  Constrictive pericarditis. In this type of pericarditis, the layers of the pericardium stiffen and develop scar tissue. The scar tissue thickens and sticks together. This makes it difficult for the heart to pump and work as it normally does. CAUSES  Pericarditis can be caused from different conditions, such as:  A bacterial, fungal or viral infection.  After a heart attack (myocardial infarction).  After open-heart surgery (coronary bypass graft surgery).  Auto-immune conditions such as lupus, rheumatoid arthritis or scleroderma.  Kidney failure.  Low thyroid condition (hypothyroidism).  Cancer from another part of the body that has spread  (metastasized) to the pericardium.  Chest injury or trauma.  After radiation treatment.  Certain medicines. SYMPTOMS  Symptoms of pericarditis can include:  Chest pain. Chest pain symptoms may increase when laying down and may be relieved when sitting up and leaning forward.  A chronic, dry cough.  Heart palpitations. These may feel like rapid, fluttering or pounding heart beats.  Chest pain may be worse when swallowing.  Dizziness or fainting.  Tiredness, fatigue or lethargy.  Fever. DIAGNOSIS  Pericarditis is diagnosed by the following:  A physical exam. A heart sound called a pericardial friction rub may be heard when your caregiver listens to your heart.  Blood work. Blood may be drawn to check for an infection and to look at your blood chemistry.  Electrocardiography. During electrocardiography your heart's electrical activity is monitored and recorded with a tracing on paper (electrocardiogram [ECG]).  Echocardiography.  Computed tomography (CT).  Magnetic resonance image (MRI). TREATMENT  To treat pericarditis, it is important to know the cause of it. The cause of pericarditis determines the treatment.   If the cause of pericarditis is due to an infection, treatment is based on the type of infection. If an infection is suspected in the pericardial fluid, a procedure called a pericardial fluid culture and biopsy may be done. This takes a sample of the pericardial fluid. The sample is sent to a lab which runs tests on the pericardial fluid to check for an infection.  If the autoimmune disease is the cause, treatment of the autoimmune condition will help improve the pericarditis.  If the cause of pericarditis is not known, anti-inflammatory medicines may be used to help decrease the inflammation.  Surgery may be needed. The following are types of surgeries or procedures that may be done to treat pericarditis:  Pericardial window. A pericardial window makes a cut  (incision) into the pericardial sac. This allows excess fluid in the pericardium to drain.  Pericardiocentesis. A pericardiocentesis is also known as a pericardial tap. This procedure uses a needle that is guided by X-ray to drain (aspirate) excess fluid from the pericardium.  Pericardiectomy. A pericardiectomy removes part or all of the pericardium. HOME CARE INSTRUCTIONS   Do not smoke. If you smoke, quit. Your caregiver can help you quit smoking.  Maintain a healthy weight.  Follow an exercise program as told by your caregiver.  If you drink alcohol, do so in moderation.  Eat a heart healthy diet. A registered dietician can help you learn about healthy food choices.  Keep a list of all your medicines with you at all times. Include the name, dose, how often it is taken and how it is taken. SEEK IMMEDIATE MEDICAL CARE IF:   You have chest pain or feelings of chest pressure.  You have sweating (diaphoresis) when at rest.  You have irregular heartbeats (palpitations).  You have rapid, racing heart beats.  You have unexplained fainting episodes.  You feel sick to your stomach (nausea) or vomiting without cause.  You have unexplained weakness. If you develop any of the symptoms which originally made you seek care, call for local emergency medical help. Do not drive yourself to the hospital. Document Released: 11/15/2000 Document Revised: 08/14/2011 Document Reviewed: 05/24/2011 [facility] Patient Information 2015 [place], [place]. This information is not intended to replace advice given to you by your health care provider. Make sure you discuss any questions you have with your health care provider.

## 2013-11-30 NOTE — Progress Notes (Signed)
SUBJECTIVE: Pt has chest pain which is unchanged from yesterday, but improved overall. Walked in room with exertional dyspnea. Denies PND. Denies fevers but had episodes of diaphoresis last night.      Intake/Output Summary (Last 24 hours) at 11/30/13 0958 Last data filed at 11/30/13 0853  Gross per 24 hour  Intake    106 ml  Output   1600 ml  Net  -1494 ml    Current Facility-Administered Medications  Medication Dose Route Frequency Provider Last Rate Last Dose  . acetaminophen (TYLENOL) tablet 500 mg  500 mg Oral Q4H PRN Cresenciano Genre, MD   500 mg at 11/29/13 1015  . amLODipine (NORVASC) tablet 5 mg  5 mg Oral Daily Cresenciano Genre, MD   5 mg at 11/29/13 0943  . aspirin EC tablet 81 mg  81 mg Oral QPM Cresenciano Genre, MD   81 mg at 11/29/13 1710  . calcium carbonate (TUMS - dosed in mg elemental calcium) chewable tablet 1,200 mg of elemental calcium  6 tablet Oral TID Cresenciano Genre, MD   1,200 mg of elemental calcium at 11/29/13 2322  . colchicine tablet 0.3 mg  0.3 mg Oral Daily Cresenciano Genre, MD   0.3 mg at 11/29/13 0943  . cyanocobalamin tablet 500 mcg  500 mcg Oral QPM Axel Filler, MD   500 mcg at 11/29/13 1710  . feeding supplement (ENSURE) (ENSURE) pudding 1 Container  1 Container Oral TID BM Hazle Coca, RD   1 Container at 11/29/13 2000  . FLUoxetine (PROZAC) capsule 10 mg  10 mg Oral Daily Cresenciano Genre, MD   10 mg at 11/29/13 0943  . folic acid (FOLVITE) tablet 1 mg  1 mg Oral Daily Cresenciano Genre, MD   1 mg at 11/29/13 3888  . furosemide (LASIX) injection 40 mg  40 mg Intravenous Once Herminio Commons, MD      . furosemide (LASIX) tablet 40 mg  40 mg Oral Daily Herminio Commons, MD   40 mg at 11/29/13 1824  . gabapentin (NEURONTIN) capsule 600 mg  600 mg Oral TID Cresenciano Genre, MD   600 mg at 11/29/13 2322  . guaiFENesin-codeine 100-10 MG/5ML solution 5 mL  5 mL Oral Q4H PRN Cresenciano Genre, MD      . heparin injection 5,000 Units  5,000  Units Subcutaneous 3 times per day Jessee Avers, MD   5,000 Units at 11/30/13 0645  . lidocaine (LIDODERM) 5 % 1 patch  1 patch Transdermal Q24H Cresenciano Genre, MD   1 patch at 11/29/13 2322  . lipase/protease/amylase (CREON-12/PANCREASE) capsule 1 capsule  1 capsule Oral TID AC Cresenciano Genre, MD   1 capsule at 11/30/13 0820  . loratadine (CLARITIN) tablet 10 mg  10 mg Oral Daily Cresenciano Genre, MD   10 mg at 11/29/13 [phone]  . [START ON 12/01/2013] magnesium oxide (MAG-OX) tablet 400 mg  400 mg Oral TID PC & HS Cresenciano Genre, MD      . multivitamin with minerals tablet 1 tablet  1 tablet Oral Daily Cresenciano Genre, MD   1 tablet at 11/29/13 [phone]  . pantoprazole (PROTONIX) EC tablet 40 mg  40 mg Oral Daily Cresenciano Genre, MD   40 mg at 11/29/13 1015  . predniSONE (DELTASONE) tablet 20 mg  20 mg Oral Q breakfast Cresenciano Genre, MD   20 mg at 11/29/13 0956  .  sodium bicarbonate tablet 1,300 mg  1,300 mg Oral BID Cresenciano Genre, MD   1,300 mg at 11/29/13 2322  . thiamine (VITAMIN B-1) tablet 100 mg  100 mg Oral Daily Cresenciano Genre, MD   100 mg at 11/29/13 7341  . [START ON 12/04/2013] Vitamin D (Ergocalciferol) (DRISDOL) capsule 50,000 Units  50,000 Units Oral Q7 days Cresenciano Genre, MD        Filed Vitals:   11/29/13 9379 11/29/13 1330 11/29/13 2042 11/30/13 0616  BP: 131/87 127/85 141/89 137/85  Pulse: 100 106 107 103  Temp:  98.3 F (36.8 C) 97.7 F (36.5 C) 98 F (36.7 C)  TempSrc:  Oral Oral Oral  Resp:  18 18 18   Weight:    96 lb 14.4 oz (43.954 kg)  SpO2:  93% 97% 96%    PHYSICAL EXAM General: NAD  Neck: JVP 10 cm H2O, no thyromegaly or thyroid nodule.  Lungs: Bibasilar crackles.  CV: Nondisplaced PMI. Tachycardic, HR low 100 bpm range, normal S1/S2, no S3, +S4, no murmur. No rub was appreciated. No peripheral edema. No carotid bruit. Normal pedal pulses.  Abdomen: Soft, nontender, no hepatosplenomegaly, no distention.  Skin: Intact without lesions or rashes.  Neurologic: Alert  and oriented x 3.  Psych: Normal affect.  Extremities: No clubbing or cyanosis.  HEENT: Normal.   TELEMETRY: Reviewed telemetry pt in sinus tachycardia, 7-beat run of NSVT.  LABS: Basic Metabolic Panel:  Recent Labs  11/28/13 1112  11/29/13 0310 11/29/13 0720 11/30/13 0312  NA 137  < >  --  135* 134*  K 4.2  < >  --  4.5 3.7  CL 95*  < >  --  93* 91*  CO2 24  < >  --  23 24  GLUCOSE 114*  < >  --  238* 152*  BUN 51*  < >  --  54* 53*  CREATININE 2.56*  < >  --  2.52* 2.47*  CALCIUM 7.9*  < >  --  7.6* 7.6*  MG 1.1*  < >  --  2.6* 1.9  PHOS 2.2*  --  3.0  --   --   < > = values in this interval not displayed. Liver Function Tests:  Recent Labs  11/28/13 1112 11/29/13 0143  AST  --  37  ALT  --  22  ALKPHOS  --  294*  BILITOT  --  <0.2*  PROT  --  7.5  ALBUMIN 2.3* 2.0*   No results found for this basename: LIPASE, AMYLASE,  in the last 72 hours CBC:  Recent Labs  11/28/13 1112 11/29/13 0720  WBC 14.2* 17.1*  NEUTROABS  --  14.3*  HGB 8.9* 7.9*  HCT 27.0* 24.1*  MCV 97.1 98.4  PLT 236 236   Cardiac Enzymes:  Recent Labs  11/28/13 2050 11/29/13 0133 11/29/13 0720  TROPONINI <0.30 <0.30 <0.30   BNP: No components found with this basename: POCBNP,  D-Dimer: No results found for this basename: DDIMER,  in the last 72 hours Hemoglobin A1C:  Recent Labs  11/28/13 1152  HGBA1C 5.4   Fasting Lipid Panel: No results found for this basename: CHOL, HDL, LDLCALC, TRIG, CHOLHDL, LDLDIRECT,  in the last 72 hours Thyroid Function Tests: No results found for this basename: TSH, T4TOTAL, FREET3, T3FREE, THYROIDAB,  in the last 72 hours Anemia Panel: No results found for this basename: VITAMINB12, FOLATE, FERRITIN, TIBC, IRON, RETICCTPCT,  in the last 72 hours  RADIOLOGY: Dg  Chest 2 View  11/28/2013   CLINICAL DATA:  increased sob  EXAM: CHEST  2 VIEW  COMPARISON:  Prior radiograph from 11/26/2013  FINDINGS: Cardiomegaly is stable as compared to prior  exam.  Lungs are mildly hypoinflated. There is persistent diffuse pulmonary edema, slightly improved from prior. Bilateral pleural effusions are also slightly improved. No definite new focal infiltrate. There is no pneumothorax.  Chronic fracture deformity of the proximal left humerus noted, unchanged. No acute osseous abnormality.  IMPRESSION: Slight interval improvement in diffuse pulmonary edema with decreased size of bilateral pleural effusions, consistent with improving CHF.   Electronically Signed   By: Jeannine Boga M.D.   On: 11/28/2013 21:47   Dg Chest 2 View  11/22/2013   CLINICAL DATA:  Two week history of cough  EXAM: CHEST  2 VIEW  COMPARISON:  Prior chest x-ray 09/25/2013  FINDINGS: Stable cardiac and mediastinal contours. Interval resolution of right lower lobe infiltrate compared to 09/25/2013. No new airspace consolidation. No pulmonary edema, pleural effusion or pneumothorax. No suspicious pulmonary nodule or mass. Stable background bronchitic changes. No acute osseous abnormality.  IMPRESSION: No active cardiopulmonary disease.  Interval resolution of right lower lobe infiltrate compared to 09/25/2013.   Electronically Signed   By: Jacqulynn Cadet M.D.   On: 11/22/2013 10:29   Dg Chest Port 1 View  11/26/2013   CLINICAL DATA:  Pulmonary  EXAM: PORTABLE CHEST - 1 VIEW  COMPARISON:  11/25/2013  FINDINGS: Cardiac enlargement. Pulmonary vascular congestion and bilateral edema is unchanged. Bilateral effusion and bibasilar atelectasis also unchanged.  IMPRESSION: Congestive heart failure with edema and effusions unchanged from the prior study.   Electronically Signed   By: Franchot Gallo M.D.   On: 11/26/2013 07:34   Dg Chest Port 1 View  11/25/2013   CLINICAL DATA:  Followup pulmonary edema  EXAM: PORTABLE CHEST - 1 VIEW  COMPARISON:  11/24/2013  FINDINGS: Bilateral effusions with perihilar and more confluent lung base opacity is stable from the prior exam. Cardiac silhouette is  mildly enlarged. No pneumothorax.  IMPRESSION: No significant change from the previous day's study. Findings consistent with pulmonary edema with bilateral effusions.   Electronically Signed   By: Lajean Manes M.D.   On: 11/25/2013 07:19   Dg Chest Port 1 View  11/24/2013   CLINICAL DATA:  Chest pain, shortness of breath.  EXAM: PORTABLE CHEST - 1 VIEW  COMPARISON:  November 22, 2013.  FINDINGS: Mild cardiomegaly is noted. Bilateral perihilar and basilar opacities are noted most consistent with pulmonary edema with mild associated pleural effusions. No pneumothorax is noted. Bony thorax is intact.  IMPRESSION: Bilateral perihilar and basilar opacities are noted most consistent with pulmonary edema. Mild bilateral pleural effusions are noted.   Electronically Signed   By: Sabino Dick M.D.   On: 11/24/2013 08:29      ASSESSMENT AND PLAN: 1. Acute pericarditis: She is stable symptomatically with prednisone and colchicine. Etiology includes viral given recent exacerbation of bronchitis in setting of pericardial effusion. Currently being evaluated for autoimmune etiology given family history of SLE. ANA pending. I did not appreciate a rub and I suspect she has had decreased inflammation after the institution of prednisone.   2. Acute on chronic diastolic heart failure: Has persistent bibasilar rales. Continue Lasix 40 mg daily. Had approximately 1.6 liters of output on 6/27. Will give an additional dose of IV Lasix today.  3. HTN: Controlled on present therapy.   4. Pericardial effusion: I spoke with  Dr. Wynonia Lawman on 6/27 who said it is in fact a large effusion, but there is no RA or RV inversion and the IVC collapses. Hence, there is no evidence of tamponade physiology. This may also improve with decreased pericardial inflammation with steroids. Etiology remains the same (viral, bacterial, autoimmune). She will need a repeat limited echocardiogram in 2 days to assess for interval improvement.   Dispo: My  recommendation would be to observe for one more day with possible discharge on 6/29.  Kate Sable, M.D., F.A.C.C.

## 2013-11-30 NOTE — Progress Notes (Signed)
HR up to 120's while up to BR 106 while at rest in bed

## 2013-12-01 ENCOUNTER — Other Ambulatory Visit: Payer: Self-pay

## 2013-12-01 ENCOUNTER — Other Ambulatory Visit: Payer: Self-pay | Admitting: Physician Assistant

## 2013-12-01 DIAGNOSIS — D631 Anemia in chronic kidney disease: Secondary | ICD-10-CM

## 2013-12-01 DIAGNOSIS — I309 Acute pericarditis, unspecified: Secondary | ICD-10-CM

## 2013-12-01 DIAGNOSIS — N039 Chronic nephritic syndrome with unspecified morphologic changes: Secondary | ICD-10-CM

## 2013-12-01 DIAGNOSIS — I313 Pericardial effusion (noninflammatory): Secondary | ICD-10-CM

## 2013-12-01 DIAGNOSIS — I3139 Other pericardial effusion (noninflammatory): Secondary | ICD-10-CM

## 2013-12-01 DIAGNOSIS — I319 Disease of pericardium, unspecified: Secondary | ICD-10-CM

## 2013-12-01 DIAGNOSIS — E876 Hypokalemia: Secondary | ICD-10-CM

## 2013-12-01 LAB — BASIC METABOLIC PANEL
BUN: 54 mg/dL — AB (ref 6–23)
CO2: 30 mEq/L (ref 19–32)
CREATININE: 2.35 mg/dL — AB (ref 0.50–1.10)
Calcium: 8.1 mg/dL — ABNORMAL LOW (ref 8.4–10.5)
Chloride: 93 mEq/L — ABNORMAL LOW (ref 96–112)
GFR calc Af Amer: 25 mL/min — ABNORMAL LOW (ref >90)
GFR, EST NON AFRICAN AMERICAN: 22 mL/min — AB (ref >90)
Glucose, Bld: 90 mg/dL (ref 70–99)
POTASSIUM: 3.8 meq/L (ref 3.7–5.3)
Sodium: 136 mEq/L — ABNORMAL LOW (ref 137–147)

## 2013-12-01 LAB — ANTI-NUCLEAR AB-TITER (ANA TITER): ANA Titer 1: 1:320 {titer} — ABNORMAL HIGH

## 2013-12-01 LAB — ANTI-DNA ANTIBODY, DOUBLE-STRANDED: DS DNA AB: 2 [IU]/mL

## 2013-12-01 LAB — MAGNESIUM: Magnesium: 1.7 mg/dL (ref 1.5–2.5)

## 2013-12-01 LAB — ANA: Anti Nuclear Antibody(ANA): POSITIVE — AB

## 2013-12-01 MED ORDER — COLCHICINE 0.6 MG PO TABS
0.3000 mg | ORAL_TABLET | Freq: Every day | ORAL | Status: DC
  Administered 2013-12-01 – 2013-12-09 (×7): 0.3 mg via ORAL
  Filled 2013-12-01 (×9): qty 0.5

## 2013-12-01 MED ORDER — COLCHICINE 0.6 MG PO TABS: 0.6000 mg | ORAL_TABLET | Freq: Every day | ORAL | Status: DC

## 2013-12-01 NOTE — Progress Notes (Signed)
Echocardiogram 2D Echocardiogram has been performed.  [name] [name] 12/01/2013, 8:24 AM

## 2013-12-01 NOTE — Progress Notes (Signed)
Subjective: Patient seen at bedside this AM. No acute events overnight. Still w/ mild SOB, no chest pain. Patient still w/ JVD to jaw line and friction rub heard when patient sitting up at bedside this morning. Afebrile, still tachycardic.   Objective: Vital signs in last 24 hours: Filed Vitals:   11/30/13 1329 11/30/13 2111 12/01/13 0653 12/01/13 0700  BP: 121/77 140/93 145/93   Pulse: 120 109 103   Temp: 98.3 F (36.8 C) 98.1 F (36.7 C) 97.9 F (36.6 C)   TempSrc: Oral Oral Oral   Resp: 17 18 18    Height:    5\' 1"  (1.549 m)  Weight:   97 lb (44 kg)   SpO2: 95% 96% 94%    Weight change: 1.6 oz (0.046 kg)  Intake/Output Summary (Last 24 hours) at 12/01/13 1030 Last data filed at 12/01/13 0900  Gross per 24 hour  Intake    360 ml  Output   2875 ml  Net  -2515 ml   Physical Exam: General: Alert, cooperative, NAD. HEENT: PERRL, EOMI. Moist mucus membranes Neck: Full range of motion without pain, supple, no lymphadenopathy or carotid bruits. JVD to jaw line.  Lungs: Normal work of respiration. Still w/ bibasilar crackles. No wheezes. Heart: Tachycardic, RR. No murmurs or gallops. Friction rub present on sitting.  Abdomen: Soft, non-tender, non-distended, BS + Extremities: No cyanosis, clubbing, or edema Neurologic: Alert & oriented X3, cranial nerves II-XII intact, strength grossly intact, sensation intact to light touch   Lab Results: Basic Metabolic Panel:  Recent Labs Lab 11/28/13 1112  11/29/13 0310  11/30/13 0312 12/01/13 0414 12/01/13 0700  NA 137  < >  --   < > 134* 136*  --   K 4.2  < >  --   < > 3.7 3.8  --   CL 95*  < >  --   < > 91* 93*  --   CO2 24  < >  --   < > 24 30  --   GLUCOSE 114*  < >  --   < > 152* 90  --   BUN 51*  < >  --   < > 53* 54*  --   CREATININE 2.56*  < >  --   < > 2.47* 2.35*  --   CALCIUM 7.9*  < >  --   < > 7.6* 8.1*  --   MG 1.1*  < >  --   < > 1.9  --  1.7  PHOS 2.2*  --  3.0  --   --   --   --   < > = values in this  interval not displayed. Liver Function Tests:  Recent Labs Lab 11/28/13 1112 11/29/13 0143  AST  --  37  ALT  --  22  ALKPHOS  --  294*  BILITOT  --  <0.2*  PROT  --  7.5  ALBUMIN 2.3* 2.0*   CBC:  Recent Labs Lab 11/28/13 1112 11/29/13 0720  WBC 14.2* 17.1*  NEUTROABS  --  14.3*  HGB 8.9* 7.9*  HCT 27.0* 24.1*  MCV 97.1 98.4  PLT 236 236   Cardiac Enzymes:  Recent Labs Lab 11/28/13 2050 11/29/13 0133 11/29/13 0720  TROPONINI <0.30 <0.30 <0.30   BNP:  Recent Labs Lab 11/28/13 2050  PROBNP 17837.0*   CBG:  Recent Labs Lab 11/25/13 1401 11/25/13 1605 11/25/13 1935 11/26/13 0812 11/26/13 1215  GLUCAP 271* 294* 335* 114* 146*  Hemoglobin A1C:  Recent Labs Lab 11/28/13 1152  HGBA1C 5.4   Anemia Panel:  Recent Labs Lab 11/26/13 1030  FERRITIN 1035*  TIBC 138*  IRON 102   Urine Drug Screen: Drugs of Abuse     Component Value Date/Time   LABOPIA POSITIVE* 11/22/2013 1406   LABOPIA NEG 09/18/2011 0936   COCAINSCRNUR NONE DETECTED 11/22/2013 1406   COCAINSCRNUR NEG 09/18/2011 0936   LABBENZ NONE DETECTED 11/22/2013 1406   LABBENZ NEG 09/18/2011 0936   LABBENZ NEG 04/10/2011 1130   AMPHETMU NONE DETECTED 11/22/2013 1406   AMPHETMU NEG 04/10/2011 1130   THCU NONE DETECTED 11/22/2013 1406   LABBARB NONE DETECTED 11/22/2013 1406   LABBARB NEG 09/18/2011 0936    Urinalysis:  Recent Labs Lab 11/29/13 0203  COLORURINE YELLOW  LABSPEC 1.010  PHURINE 6.0  GLUCOSEU NEGATIVE  HGBUR NEGATIVE  BILIRUBINUR NEGATIVE  KETONESUR NEGATIVE  PROTEINUR NEGATIVE  UROBILINOGEN 0.2  NITRITE NEGATIVE  LEUKOCYTESUR NEGATIVE    Micro Results: Recent Results (from the past 240 hour(s))  CULTURE, BLOOD (ROUTINE X 2)     Status: None   Collection Time    11/24/13  8:30 AM      Result Value Ref Range Status   Specimen Description BLOOD RIGHT ARM   Final   Special Requests BOTTLES DRAWN AEROBIC ONLY 1CC   Final   Culture  Setup Time     Final   Value:  11/24/2013 14:18     Performed at [facility]   Culture     Final   Value: NO GROWTH 5 DAYS     Performed at [facility]   Report Status 11/30/2013 FINAL   Final  CULTURE, BLOOD (ROUTINE X 2)     Status: None   Collection Time    11/24/13  8:34 AM      Result Value Ref Range Status   Specimen Description BLOOD LEFT HAND   Final   Special Requests BOTTLES DRAWN AEROBIC ONLY 3CC   Final   Culture  Setup Time     Final   Value: 11/24/2013 14:16     Performed at [facility]   Culture     Final   Value: NO GROWTH 5 DAYS     Performed at [facility]   Report Status 11/30/2013 FINAL   Final  MRSA PCR SCREENING     Status: None   Collection Time    11/24/13 11:28 AM      Result Value Ref Range Status   MRSA by PCR NEGATIVE  NEGATIVE Final   Comment:            The GeneXpert MRSA Assay (FDA     approved for NASAL specimens     only), is one component of a     comprehensive MRSA colonization     surveillance program. It is not     intended to diagnose MRSA     infection nor to guide or     monitor treatment for     MRSA infections.   Studies/Results: No results found. Medications: I have reviewed the patient's current medications. Scheduled Meds: . amLODipine  5 mg Oral Daily  . aspirin EC  81 mg Oral QPM  . calcium carbonate  6 tablet Oral TID  . colchicine  0.3 mg Oral Daily  . vitamin B-12  500 mcg Oral QPM  . feeding supplement (ENSURE)  1 Container Oral TID BM  . FLUoxetine  10  mg Oral Daily  . folic acid  1 mg Oral Daily  . furosemide  40 mg Oral Daily  . gabapentin  600 mg Oral TID  . heparin  5,000 Units Subcutaneous 3 times per day  . lidocaine  1 patch Transdermal Q24H  . lipase/protease/amylase  1 capsule Oral TID AC  . loratadine  10 mg Oral Daily  . magnesium oxide  400 mg Oral TID PC & HS  . multivitamin with minerals  1 tablet Oral Daily  . pantoprazole  40 mg Oral Daily  . predniSONE  20 mg Oral Q breakfast  . sodium  bicarbonate  1,300 mg Oral BID  . thiamine  100 mg Oral Daily  . [START ON 12/04/2013] Vitamin D (Ergocalciferol)  50,000 Units Oral Q7 days   Continuous Infusions:  PRN Meds:.acetaminophen, guaiFENesin-codeine  Assessment/Plan: [name] is a 58 y.o. 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   Acute pericarditis- Patient still w/ SOB this AM, JVD to the angle of the jaw present on exam. Friction rub heard w/ patient sitting up. Patient does not complain of chest pain today. ECHO from 11/29/13 showed a large pericardial effusion circumferential to the heart w/ no evidence of hemodynamic compromise. ECHO repeated today (12/01/13) shows a moderate to large free-flowing pericardial effusion w/ no internal echoes and still with no evidence of hemodynamic compromise. However, since prior study, pericardial effusion has increased in size. Discussed w/ cardiology (Dr. Aundra Dubin), given that patient does not have signs of tamponade on ECHO, does not require pericardiocentesis at this time, even though mild increase in size.  -Cardiology following; appreciate recs  -Continue Prednisone 20 mg qd for 2 weeks total (11/28/2013 - 12/11/2013), followed by tapered regimen as detailed in previous note.  -Continue Colchicine 0.3 mg po qd for 3 months (end date 02/28/14) -Autoimmune workup and Quantiferon still pending -Will keep overnight to assess clinically, can likely be discharged in AM w/ close outpatient follow up  Acute on Chronic dCHF- Improved. Still w/ bibasilar crackles and JVD. On Lasix 40 mg qd. Given extra dose of Lasix 40 mg IV yesterday. Patient is net -3.7L since admission, -1.7L in the past 24 hours.  -Continue Lasix 40 mg qd -Daily weights and strict I&Os   Hypomagnesemia- 1.7 this AM.  -Continue  Mag Oxide 400 mg tid during admission.  -Will discharge on Mag Oxide 400 mg bid for better compliance.  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 Found to be higher during her recent admission and has remained elevated at ~2.6. Somewhat improved this AM to 2.35, Cr trend as follows:   Recent Labs Lab 11/28/13 1112 11/29/13 0143 11/29/13 0720 11/30/13 0312 12/01/13 0414  CREATININE 2.56* 2.58* 2.52* 2.47* 2.35*  -Continue Lasix as above.  -Outpatient renal follow up; has appointment w/ Dr. Justin Mend on 7/1.  AG metabolic acidosis- Resolved, HCO3 30 this AM. Most likely 2/2 worsening CKD.   -Discontinue NaHCO3 for now; may restart when discharged -Repeat BMP in AM  Anemia of chronic disease- Hgb 8.9 at admission, close to baseline of 8-10. Associated w/ CKD.  -Continue to monitor.   HTN- BP controlled.  -Continue Amlodipine 5 mg po qd  Diet- Renal w/125mL fluid restriction   DVT/PE PPx- Heparin [place]  Dispo: D/c tomorrow  The patient does have a current PCP (  Otho Bellows, MD) and does need an [facility] hospital follow-up appointment after discharge.  The patient does not have transportation limitations that hinder transportation to clinic appointments.  .Services Needed at time of discharge: Y = Yes, Blank = No PT:   OT:   RN: Y  Equipment:   Other:     LOS: 3 days   Corky Sox, MD 12/01/2013, 10:30 AM

## 2013-12-01 NOTE — Progress Notes (Signed)
Utilization review completed.  

## 2013-12-01 NOTE — Progress Notes (Signed)
[name], female   DOB: November 24, 1955, 58 y.o.   MRN: [id number]       SUBJECTIVE:  Patient has minimal chest pain but feels like her breathing is "not right."  She was unable to sleep last night.  The dyspnea has not changed much since admission.  She remains tachycardic with stable blood pressure.     Intake/Output Summary (Last 24 hours) at 12/01/13 0819 Last data filed at 12/01/13 0500  Gross per 24 hour  Intake    100 ml  Output   2600 ml  Net  -2500 ml    Current Facility-Administered Medications  Medication Dose Route Frequency Provider Last Rate Last Dose  . acetaminophen (TYLENOL) tablet 500 mg  500 mg Oral Q4H PRN Cresenciano Genre, MD   500 mg at 11/29/13 1015  . amLODipine (NORVASC) tablet 5 mg  5 mg Oral Daily Cresenciano Genre, MD   5 mg at 11/30/13 1100  . aspirin EC tablet 81 mg  81 mg Oral QPM Cresenciano Genre, MD   81 mg at 11/30/13 1709  . calcium carbonate (TUMS - dosed in mg elemental calcium) chewable tablet 1,200 mg of elemental calcium  6 tablet Oral TID Cresenciano Genre, MD   1,200 mg of elemental calcium at 11/30/13 2210  . colchicine tablet 0.3 mg  0.3 mg Oral Daily Cresenciano Genre, MD   0.3 mg at 11/30/13 1100  . cyanocobalamin tablet 500 mcg  500 mcg Oral QPM Axel Filler, MD   500 mcg at 11/30/13 1711  . feeding supplement (ENSURE) (ENSURE) pudding 1 Container  1 Container Oral TID BM Hazle Coca, RD   1 Container at 11/30/13 2000  . FLUoxetine (PROZAC) capsule 10 mg  10 mg Oral Daily Cresenciano Genre, MD   10 mg at 11/30/13 1059  . folic acid (FOLVITE) tablet 1 mg  1 mg Oral Daily Cresenciano Genre, MD   1 mg at 11/30/13 1100  . furosemide (LASIX) tablet 40 mg  40 mg Oral Daily Herminio Commons, MD   40 mg at 11/30/13 1100  . gabapentin (NEURONTIN) capsule 600 mg  600 mg Oral TID Cresenciano Genre, MD   600 mg at 11/30/13 2211  . guaiFENesin-codeine 100-10 MG/5ML solution 5 mL  5 mL Oral Q4H PRN Cresenciano Genre, MD      . heparin injection  5,000 Units  5,000 Units Subcutaneous 3 times per day Jessee Avers, MD   5,000 Units at 12/01/13 [phone]  . lidocaine (LIDODERM) 5 % 1 patch  1 patch Transdermal Q24H Cresenciano Genre, MD   1 patch at 11/30/13 2212  . lipase/protease/amylase (CREON-12/PANCREASE) capsule 1 capsule  1 capsule Oral TID AC Cresenciano Genre, MD   1 capsule at 11/30/13 1659  . loratadine (CLARITIN) tablet 10 mg  10 mg Oral Daily Cresenciano Genre, MD   10 mg at 11/30/13 1100  . magnesium oxide (MAG-OX) tablet 400 mg  400 mg Oral TID PC & HS Cresenciano Genre, MD      . multivitamin with minerals tablet 1 tablet  1 tablet Oral Daily Cresenciano Genre, MD   1 tablet at 11/30/13 1100  . pantoprazole (PROTONIX) EC tablet 40 mg  40 mg Oral Daily Cresenciano Genre, MD   40 mg at 11/30/13 1100  . predniSONE (DELTASONE) tablet 20 mg  20 mg Oral Q breakfast Cresenciano Genre, MD   20 mg  at 11/30/13 1100  . sodium bicarbonate tablet 1,300 mg  1,300 mg Oral BID Cresenciano Genre, MD   1,300 mg at 11/30/13 2211  . thiamine (VITAMIN B-1) tablet 100 mg  100 mg Oral Daily Cresenciano Genre, MD   100 mg at 11/30/13 1100  . [START ON 12/04/2013] Vitamin D (Ergocalciferol) (DRISDOL) capsule 50,000 Units  50,000 Units Oral Q7 days Cresenciano Genre, MD        Filed Vitals:   11/30/13 1329 11/30/13 2111 12/01/13 0653 12/01/13 0700  BP: 121/77 140/93 145/93   Pulse: 120 109 103   Temp: 98.3 F (36.8 C) 98.1 F (36.7 C) 97.9 F (36.6 C)   TempSrc: Oral Oral Oral   Resp: 17 18 18    Height:    5' 1"  (1.549 m)  Weight:   97 lb (44 kg)   SpO2: 95% 96% 94%     PHYSICAL EXAM General: NAD  Neck: JVP 10-12 cm H2O with probable Kussmaul sign, no thyromegaly or thyroid nodule.  Lungs: Bibasilar crackles.  CV: Nondisplaced PMI. Tachycardic, HR low 100 bpm range, normal S1/S2, no S3, +S4, no murmur. Friction rub was noted. No peripheral edema. No carotid bruit. Normal pedal pulses.  Abdomen: Soft, nontender, no hepatosplenomegaly, no distention.  Skin: Intact without  lesions or rashes.  Neurologic: Alert and oriented x 3.  Psych: Normal affect.  Extremities: No clubbing or cyanosis.  HEENT: Normal.   TELEMETRY: Reviewed telemetry pt in sinus tachycardia  LABS: Basic Metabolic Panel:  Recent Labs  11/28/13 1112  11/29/13 0310  11/30/13 0312 12/01/13 0414 12/01/13 0700  NA 137  < >  --   < > 134* 136*  --   K 4.2  < >  --   < > 3.7 3.8  --   CL 95*  < >  --   < > 91* 93*  --   CO2 24  < >  --   < > 24 30  --   GLUCOSE 114*  < >  --   < > 152* 90  --   BUN 51*  < >  --   < > 53* 54*  --   CREATININE 2.56*  < >  --   < > 2.47* 2.35*  --   CALCIUM 7.9*  < >  --   < > 7.6* 8.1*  --   MG 1.1*  < >  --   < > 1.9  --  1.7  PHOS 2.2*  --  3.0  --   --   --   --   < > = values in this interval not displayed. Liver Function Tests:  Recent Labs  11/28/13 1112 11/29/13 0143  AST  --  37  ALT  --  22  ALKPHOS  --  294*  BILITOT  --  <0.2*  PROT  --  7.5  ALBUMIN 2.3* 2.0*   No results found for this basename: LIPASE, AMYLASE,  in the last 72 hours CBC:  Recent Labs  11/28/13 1112 11/29/13 0720  WBC 14.2* 17.1*  NEUTROABS  --  14.3*  HGB 8.9* 7.9*  HCT 27.0* 24.1*  MCV 97.1 98.4  PLT 236 236   Cardiac Enzymes:  Recent Labs  11/28/13 2050 11/29/13 0133 11/29/13 0720  TROPONINI <0.30 <0.30 <0.30   BNP: No components found with this basename: POCBNP,  D-Dimer: No results found for this basename: DDIMER,  in the last 72 hours Hemoglobin A1C:  Recent Labs  11/28/13 1152  HGBA1C 5.4   Fasting Lipid Panel: No results found for this basename: CHOL, HDL, LDLCALC, TRIG, CHOLHDL, LDLDIRECT,  in the last 72 hours Thyroid Function Tests: No results found for this basename: TSH, T4TOTAL, FREET3, T3FREE, THYROIDAB,  in the last 72 hours Anemia Panel: No results found for this basename: VITAMINB12, FOLATE, FERRITIN, TIBC, IRON, RETICCTPCT,  in the last 72 hours  ASSESSMENT AND PLAN: 1. Acute pericarditis: She is stable  symptomatically with prednisone and colchicine but still feels like her breathing is "not right."  She has elevated JVP with probable Kussmaul sign and a friction rub.  ESR is very high. Etiology includes viral pericarditis given recent exacerbation of bronchitis in setting of pericardial effusion. Currently being evaluated for autoimmune etiology given family history of SLE. ANA pending. Given the JVD with Kussmaul's, I am concerned about worsening of the effusion (I reviewed the last 2 echoes).  I am going to repeat a limited echo today to look for evidence of tamponade.  Would continue colchicine at 0.3 given renal function and prednisone.  No NSAIDs with CKD.  If effusion smaller on echo today, could envision discharge with close followup. If evidence for tamponade, will need drainage.   2. Acute on chronic diastolic heart failure: Would avoid further IV Lasix for now, can continue po.  3. HTN: Controlled on present therapy.   4. CKD: Creatinine stably elevated.  Avoiding NSAIDs.   Loralie Champagne 12/01/2013 8:26 AM

## 2013-12-01 NOTE — Progress Notes (Signed)
Patient Name: [name] [name] Date of Encounter: 12/01/2013     Principal Problem:   Acute pericarditis, unspecified Active Problems:   HYPERTENSION   Seizure disorder   Severe protein-calorie malnutrition   FSGS (focal segmental glomerulosclerosis)   Fluid overload   Left shoulder pain    SUBJECTIVE  Occasional CP last night worse with laying down. States she would feel choking when lay on her back. Did not sleep well. Denies any dizziness  CURRENT MEDS . amLODipine  5 mg Oral Daily  . aspirin EC  81 mg Oral QPM  . calcium carbonate  6 tablet Oral TID  . colchicine  0.3 mg Oral Daily  . vitamin B-12  500 mcg Oral QPM  . feeding supplement (ENSURE)  1 Container Oral TID BM  . FLUoxetine  10 mg Oral Daily  . folic acid  1 mg Oral Daily  . furosemide  40 mg Oral Daily  . gabapentin  600 mg Oral TID  . heparin  5,000 Units Subcutaneous 3 times per day  . lidocaine  1 patch Transdermal Q24H  . lipase/protease/amylase  1 capsule Oral TID AC  . loratadine  10 mg Oral Daily  . magnesium oxide  400 mg Oral TID PC & HS  . multivitamin with minerals  1 tablet Oral Daily  . pantoprazole  40 mg Oral Daily  . predniSONE  20 mg Oral Q breakfast  . sodium bicarbonate  1,300 mg Oral BID  . thiamine  100 mg Oral Daily  . [START ON 12/04/2013] Vitamin D (Ergocalciferol)  50,000 Units Oral Q7 days    OBJECTIVE  Filed Vitals:   11/30/13 1329 11/30/13 2111 12/01/13 0653 12/01/13 0700  BP: 121/77 140/93 145/93   Pulse: 120 109 103   Temp: 98.3 F (36.8 C) 98.1 F (36.7 C) 97.9 F (36.6 C)   TempSrc: Oral Oral Oral   Resp: 17 18 18    Height:    5\' 1"  (1.549 m)  Weight:   97 lb (44 kg)   SpO2: 95% 96% 94%     Intake/Output Summary (Last 24 hours) at 12/01/13 0747 Last data filed at 12/01/13 0500  Gross per 24 hour  Intake    100 ml  Output   2600 ml  Net  -2500 ml   Filed Weights   11/29/13 0311 11/30/13 0616 12/01/13 0653  Weight: 98 lb 3.2 oz (44.543 kg) 96 lb 14.4  oz (43.954 kg) 97 lb (44 kg)    PHYSICAL EXAM  General: Pleasant, NAD. Neuro: Alert and oriented X 3. Moves all extremities spontaneously. Psych: Normal affect. HEENT:  Normal  Neck: Supple without bruits or JVD. Lungs:  Resp regular and unlabored, bibasliar rale Heart: tachycardic, regular. no s3, s4. 3/6 murmur both during systole and diastole, ? percardial rub Abdomen: Soft, non-tender, non-distended, BS + x 4.  Extremities: No clubbing, cyanosis or edema. DP/PT/Radials 2+ and equal bilaterally.  Accessory Clinical Findings  CBC  Recent Labs  11/28/13 1112 11/29/13 0720  WBC 14.2* 17.1*  NEUTROABS  --  14.3*  HGB 8.9* 7.9*  HCT 27.0* 24.1*  MCV 97.1 98.4  PLT 236 774   Basic Metabolic Panel  Recent Labs  11/28/13 1112  11/29/13 0310  11/30/13 0312 12/01/13 0414 12/01/13 0700  NA 137  < >  --   < > 134* 136*  --   K 4.2  < >  --   < > 3.7 3.8  --   CL 95*  < >  --   < >  91* 93*  --   CO2 24  < >  --   < > 24 30  --   GLUCOSE 114*  < >  --   < > 152* 90  --   BUN 51*  < >  --   < > 53* 54*  --   CREATININE 2.56*  < >  --   < > 2.47* 2.35*  --   CALCIUM 7.9*  < >  --   < > 7.6* 8.1*  --   MG 1.1*  < >  --   < > 1.9  --  1.7  PHOS 2.2*  --  3.0  --   --   --   --   < > = values in this interval not displayed. Liver Function Tests  Recent Labs  11/28/13 1112 11/29/13 0143  AST  --  37  ALT  --  22  ALKPHOS  --  294*  BILITOT  --  <0.2*  PROT  --  7.5  ALBUMIN 2.3* 2.0*   No results found for this basename: LIPASE, AMYLASE,  in the last 72 hours Cardiac Enzymes  Recent Labs  11/28/13 2050 11/29/13 0133 11/29/13 0720  TROPONINI <0.30 <0.30 <0.30   Hemoglobin A1C  Recent Labs  11/28/13 1152  HGBA1C 5.4    TELE  Sinus tach with HR 100-120s, ?ventricular rhythm vs artifact around 8am yesterday, no significant ventricular ectopy since  ECG  5/27 sinus tach with HR 90s, low R wave in all leads  Radiology/Studies  Dg Chest 2  View  11/28/2013   CLINICAL DATA:  increased sob  EXAM: CHEST  2 VIEW  COMPARISON:  Prior radiograph from 11/26/2013  FINDINGS: Cardiomegaly is stable as compared to prior exam.  Lungs are mildly hypoinflated. There is persistent diffuse pulmonary edema, slightly improved from prior. Bilateral pleural effusions are also slightly improved. No definite new focal infiltrate. There is no pneumothorax.  Chronic fracture deformity of the proximal left humerus noted, unchanged. No acute osseous abnormality.  IMPRESSION: Slight interval improvement in diffuse pulmonary edema with decreased size of bilateral pleural effusions, consistent with improving CHF.   Electronically Signed   By: Jeannine Boga M.D.   On: 11/28/2013 21:47   Dg Chest 2 View  11/22/2013   CLINICAL DATA:  Two week history of cough  EXAM: CHEST  2 VIEW  COMPARISON:  Prior chest x-ray 09/25/2013  FINDINGS: Stable cardiac and mediastinal contours. Interval resolution of right lower lobe infiltrate compared to 09/25/2013. No new airspace consolidation. No pulmonary edema, pleural effusion or pneumothorax. No suspicious pulmonary nodule or mass. Stable background bronchitic changes. No acute osseous abnormality.  IMPRESSION: No active cardiopulmonary disease.  Interval resolution of right lower lobe infiltrate compared to 09/25/2013.   Electronically Signed   By: Jacqulynn Cadet M.D.   On: 11/22/2013 10:29   Dg Chest Port 1 View  11/26/2013   CLINICAL DATA:  Pulmonary  EXAM: PORTABLE CHEST - 1 VIEW  COMPARISON:  11/25/2013  FINDINGS: Cardiac enlargement. Pulmonary vascular congestion and bilateral edema is unchanged. Bilateral effusion and bibasilar atelectasis also unchanged.  IMPRESSION: Congestive heart failure with edema and effusions unchanged from the prior study.   Electronically Signed   By: Franchot Gallo M.D.   On: 11/26/2013 07:34   Dg Chest Port 1 View  11/25/2013   CLINICAL DATA:  Followup pulmonary edema  EXAM: PORTABLE CHEST -  1 VIEW  COMPARISON:  11/24/2013  FINDINGS: Bilateral effusions  with perihilar and more confluent lung base opacity is stable from the prior exam. Cardiac silhouette is mildly enlarged. No pneumothorax.  IMPRESSION: No significant change from the previous day's study. Findings consistent with pulmonary edema with bilateral effusions.   Electronically Signed   By: Lajean Manes M.D.   On: 11/25/2013 07:19   Dg Chest Port 1 View  11/24/2013   CLINICAL DATA:  Chest pain, shortness of breath.  EXAM: PORTABLE CHEST - 1 VIEW  COMPARISON:  November 22, 2013.  FINDINGS: Mild cardiomegaly is noted. Bilateral perihilar and basilar opacities are noted most consistent with pulmonary edema with mild associated pleural effusions. No pneumothorax is noted. Bony thorax is intact.  IMPRESSION: Bilateral perihilar and basilar opacities are noted most consistent with pulmonary edema. Mild bilateral pleural effusions are noted.   Electronically Signed   By: Sabino Dick M.D.   On: 11/24/2013 08:29    ASSESSMENT AND PLAN  1. Acute pericarditis: on prednisone and colchicine  - sed rate high, undergoing eval for autoimmune  - possibly related to viral infection as had bronchitis recently  - Echo 12/24/2013 EF 01-65%, grade 2 diastolic dysfunction, small circumferential pericardial effusion, no hemodynamic compromise  - Limited Echo 12/29/2013 large pericardial effusion, no sign of tamponade, no change when compare to previous echo  - review of echo by Dr. Aundra Dubin shows pericardial effusion appears to increased since previous echo  - will obtain stat limited echo to assess pericardial effusion   - continue steroid and colchicine, not on NSAID due to CKD  2. Acute on chronic diastolic HF: continue on lasix  3. HTN: stable on current regimen  4. Pericardial effusion: no evidence of tamponade physiology on previous echo  - tachycardic overnight >100, plan to obtain stat limit echo  - obtained EKG to assess electrical  alternan  - may need pericardiocentesis if has sign of tamponade on echo   Signed, Almyra Deforest PA-C Pager: [phone]

## 2013-12-01 NOTE — Progress Notes (Signed)
See my note from earlier today.

## 2013-12-01 NOTE — Progress Notes (Signed)
  Date: 12/01/2013  Patient name: [name] [name]  Medical record number: [id number]  Date of birth: [birth year]   This patient has been seen and the plan of care was discussed with the house staff. Please see their note for complete details. I concur with their findings with the following additions/corrections:  Repeat TTE with increased size of effusion, however, no hemodynamic compromise.  Will monitor patient for another day. Continue telemetry.   Sid Falcon, MD 12/01/2013, 2:35 PM

## 2013-12-01 NOTE — Progress Notes (Signed)
Repeat limited echo today continue to show no sign of tamponade. Possible discharge per IM team. The patient will need very close follow up in case tamponade forms. I have scheduled outpatient limited echo on 12/08/2013 prior to her follow up with Amie Portland on 12/10/2013. If has worsening pericardial effusion, she will need pericardiocentesis.   Signed, Almyra Deforest PA-C Pager: [phone]

## 2013-12-02 ENCOUNTER — Encounter (HOSPITAL_COMMUNITY): Payer: Self-pay | Admitting: Physician Assistant

## 2013-12-02 ENCOUNTER — Inpatient Hospital Stay (HOSPITAL_COMMUNITY): Payer: PRIVATE HEALTH INSURANCE

## 2013-12-02 DIAGNOSIS — I319 Disease of pericardium, unspecified: Secondary | ICD-10-CM

## 2013-12-02 LAB — QUANTIFERON TB GOLD ASSAY (BLOOD)
Mitogen value: 0.1 IU/mL
Quantiferon Nil Value: 0.01 IU/mL
TB AG VALUE: 0.02 [IU]/mL
TB Antigen Minus Nil Value: 0.01 IU/mL

## 2013-12-02 LAB — CBC
HEMATOCRIT: 25.5 % — AB (ref 36.0–46.0)
Hemoglobin: 8 g/dL — ABNORMAL LOW (ref 12.0–15.0)
MCH: 31.9 pg (ref 26.0–34.0)
MCHC: 31.4 g/dL (ref 30.0–36.0)
MCV: 101.6 fL — AB (ref 78.0–100.0)
PLATELETS: 353 10*3/uL (ref 150–400)
RBC: 2.51 MIL/uL — ABNORMAL LOW (ref 3.87–5.11)
RDW: 14.9 % (ref 11.5–15.5)
WBC: 13.1 10*3/uL — ABNORMAL HIGH (ref 4.0–10.5)

## 2013-12-02 LAB — BASIC METABOLIC PANEL
BUN: 55 mg/dL — AB (ref 6–23)
CALCIUM: 8 mg/dL — AB (ref 8.4–10.5)
CO2: 29 meq/L (ref 19–32)
CREATININE: 2.1 mg/dL — AB (ref 0.50–1.10)
Chloride: 91 mEq/L — ABNORMAL LOW (ref 96–112)
GFR calc Af Amer: 29 mL/min — ABNORMAL LOW (ref >90)
GFR calc non Af Amer: 25 mL/min — ABNORMAL LOW (ref >90)
GLUCOSE: 95 mg/dL (ref 70–99)
Potassium: 4.3 mEq/L (ref 3.7–5.3)
Sodium: 135 mEq/L — ABNORMAL LOW (ref 137–147)

## 2013-12-02 LAB — MAGNESIUM: Magnesium: 1.5 mg/dL (ref 1.5–2.5)

## 2013-12-02 LAB — T4, FREE: Free T4: 0.57 ng/dL — ABNORMAL LOW (ref 0.80–1.80)

## 2013-12-02 LAB — TSH: TSH: 0.897 u[IU]/mL (ref 0.350–4.500)

## 2013-12-02 MED ORDER — MAGNESIUM SULFATE 40 MG/ML IJ SOLN
2.0000 g | Freq: Once | INTRAMUSCULAR | Status: AC
  Administered 2013-12-02: 2 g via INTRAVENOUS
  Filled 2013-12-02: qty 50

## 2013-12-02 NOTE — Discharge Summary (Signed)
Name: [name] [name] MRN: [id number] DOB: 10-10-1955 58 y.o. PCP: Otho Bellows, MD  Date of Admission: 11/28/2013  5:53 PM Date of Discharge: 12/09/2013 Attending Physician: Annia Belt, MD  Discharge Diagnosis: 1. Acute Pericarditis w/ Pericardial Effusion 2. Acute on Chronic dCHF 3. CKD stage IV 4. AG Metabolic Acidosis   Discharge Medications:   Medication List    STOP taking these medications       aspirin EC 81 MG tablet      TAKE these medications       acetaminophen 500 MG tablet  Commonly known as:  TYLENOL  Take 1 tablet (500 mg total) by mouth every 4 (four) hours as needed for mild pain, moderate pain or headache.     amLODipine 5 MG tablet  Commonly known as:  NORVASC  Take 1 tablet (5 mg total) by mouth daily.     calcium carbonate 500 MG chewable tablet  Commonly known as:  TUMS  Chew 6 tablets (1,200 mg of elemental calcium total) by mouth 3 (three) times daily. For bone health     cetirizine 10 MG tablet  Commonly known as:  ZYRTEC  Take 1 tablet (10 mg total) by mouth daily.     colchicine 0.6 MG tablet  Take 0.5 tablets (0.3 mg total) by mouth daily.     feeding supplement (ENSURE COMPLETE) Liqd  Take 237 mLs by mouth 3 (three) times daily between meals.     FLUoxetine 10 MG capsule  Commonly known as:  PROZAC  Take 1 capsule (10 mg total) by mouth daily. For depression     folic acid 1 MG tablet  Commonly known as:  FOLVITE  Take 1 tablet (1 mg total) by mouth daily. For folic acid replacement     furosemide 40 MG tablet  Commonly known as:  LASIX  Take 1 tablet (40 mg total) by mouth daily.     gabapentin 300 MG capsule  Commonly known as:  NEURONTIN  Take 2 capsules (600 mg total) by mouth 3 (three) times daily. For anxiety/pain control     guaiFENesin-codeine 100-10 MG/5ML syrup  Take 5 mLs by mouth every 4 (four) hours as needed for cough.     lipase/protease/amylase 12000 UNITS Cpep capsule  Commonly known as:   CREON-12/PANCREASE  Take 1 capsule by mouth 3 (three) times daily before meals.     magnesium oxide 400 MG tablet  Commonly known as:  MAG-OX  Take 1 tablet (400 mg total) by mouth 2 (two) times daily.     multivitamin with minerals Tabs tablet  Take 1 tablet by mouth daily. For vitamin replacement     omeprazole 40 MG capsule  Commonly known as:  PRILOSEC  Take 1 capsule (40 mg total) by mouth daily.     oxyCODONE 5 MG immediate release tablet  Commonly known as:  Oxy IR/ROXICODONE  Take 1-2 tablets (5-10 mg total) by mouth every 4 (four) hours as needed for severe pain.     predniSONE 5 MG tablet  Commonly known as:  DELTASONE  Take 2 tablets (10 mg total) by mouth daily with breakfast.     senna-docusate 8.6-50 MG per tablet  Commonly known as:  Senokot-S  Take 1 tablet by mouth at bedtime.     sodium bicarbonate 650 MG tablet  Take 2 tablets (1,300 mg total) by mouth 2 (two) times daily.     thiamine 100 MG tablet  Commonly known as:  VITAMIN  B-1  Take 1 tablet (100 mg total) by mouth daily. For low thiamine     vitamin B-12 250 MCG tablet  Commonly known as:  CYANOCOBALAMIN  Take 1 tablet (250 mcg total) by mouth every evening.     Vitamin D (Ergocalciferol) 50000 UNITS Caps capsule  Commonly known as:  DRISDOL  Take 1 capsule (50,000 Units total) by mouth every 7 (seven) days. Thursdays: For bone health        Disposition and follow-up:   [name] was discharged from [facility] in good condition.  At the hospital follow up visit please address:  1.    Continue Prednisone 20 mg qd for 2 weeks total (11/28/2013 - 12/11/2013), followed by taper regimen:  17.5 mg qd for 2 weeks (12/12/2013 - 12/26/2013),  then 15 mg qd for 2 weeks (12/27/2013 - 01/10/2014),  then 12.5 mg qd for 2 weeks (01/11/2014 - 02/25/2014),  then 10 mg qd for 2 weeks (01/26/2014 - 02/09/2014),  then 7.5 mg qd for 2 weeks (02/10/2014 - 02/24/2014),  then 5 mg qd for 2 weeks  (02/25/2014 - 03/11/2014),  then 2.5 mg qd for 2 weeks (03/12/2014 - 03/26/2014), THEN STOP.  -Continue colchicine 0.3 mg qd for 3 months (end date 02/28/2014).   2.  Labs / imaging needed at time of follow-up:  3.  Pending labs/ test needing follow-up:  Follow-up Appointments: Follow-up Information   Follow up with [facility] (home health nurse and aide)    Contact information:   [address] [place] [address] [phone]       Follow up with Ermalinda Barrios, PA-C On 12/10/2013. (1:45pm)    Specialty:  Cardiology   Contact information:   [address] [place] [address] [phone]       Follow up with [facility] On 12/08/2013. (9:00am)    Contact information:   [address] [place] [id number] [phone]      Follow up with Otho Bellows, MD On 12/11/2013. (1:45 PM)    Specialty:  Internal Medicine   Contact information:   [place] [address] [phone]       Discharge Instructions: Discharge Instructions   Diet - low sodium heart healthy    Complete by:  As directed      Increase activity slowly    Complete by:  As directed            Consultations: Treatment Team:  Ivin Poot, MD  Procedures Performed:  Dg Chest 2 View  12/02/2013   CLINICAL DATA:  Shortness of breath  EXAM: CHEST  2 VIEW  COMPARISON:  11/28/2013  FINDINGS: Cardiac shadow is again enlarged. Bibasilar infiltrates are seen left greater than right with associated effusion. This is increased slightly in the interval from the prior exam particularly in the left lower lobe.  IMPRESSION: Slight increase in the degree of basilar infiltrates with associated effusions.   Electronically Signed   By: Inez Catalina M.D.   On: 12/02/2013 15:44   Dg Chest 2 View  11/28/2013   CLINICAL DATA:  increased sob  EXAM: CHEST  2 VIEW  COMPARISON:  Prior radiograph from 11/26/2013  FINDINGS: Cardiomegaly is stable as  compared to prior exam.  Lungs are mildly hypoinflated. There is persistent diffuse pulmonary edema, slightly improved from prior. Bilateral pleural effusions are also slightly improved. No definite new focal infiltrate. There is no pneumothorax.  Chronic fracture deformity of the proximal left humerus noted, unchanged. No acute osseous abnormality.  IMPRESSION: Slight interval improvement in diffuse pulmonary edema with decreased size of bilateral pleural effusions, consistent with improving CHF.   Electronically Signed   By: Jeannine Boga M.D.   On: 11/28/2013 21:47    Admission HPI: Ms. 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 She was recently hospitalized for AG metabolic acidosis and AKI on CKD. She developed acute hypoxic respiratory failure 2/2 to volume overload during admission and ECHO revealed grade 2 diastolic dysfuntion. She was appropriately diuresed and discharged on Lasix 40mg  daily. She was seen in clinic today for hospital follow-up. She says she felt ok but continued to have pleuritic chest pain. During last admission she reported chest tightness and pleuritic chest pain thought to be secondary to cough. She says the chest pain returned last night. It is 10/10, constant and worse with burping, coughing and taking deep breaths. Leaning forward provides some relief. She denies dyspnea, fever/chills, PND or orthopnea. Her appetite is good, she denies N/V and her cough is improving. She had diarrhea yesterday which has resolved today. She reports compliance with Lasix.  Given symptoms suspicious for pericarditis coupled with tachycardia and new JVD she was directly admitted from clinic to evaluate for cardiac tamponade.  Hospital Course by problem list:   1. Acute Pericarditis w/ Pericardial Effusion-  2. Acute on Chronic dCHF-  3. CKD stage IV-  4. AG Metabolic  Acidosis-   Discharge Vitals:   BP 151/94  Pulse 109  Temp(Src) 98.3 F (36.8 C) (Oral)  Resp 18  Ht 5\' 1"  (1.549 m)  Wt 99 lb 1.6 oz (44.951 kg)  BMI 18.73 kg/m2  SpO2 96%  Discharge Labs:  Results for orders placed during the hospital encounter of 11/28/13 (from the past 24 hour(s))  BASIC METABOLIC PANEL     Status: Abnormal   Collection Time    12/09/13  4:56 AM      Result Value Ref Range   Sodium 135 (*) 137 - 147 mEq/L   Potassium 5.2  3.7 - 5.3 mEq/L   Chloride 96  96 - 112 mEq/L   CO2 26  19 - 32 mEq/L   Glucose, Bld 97  70 - 99 mg/dL   BUN 59 (*) 6 - 23 mg/dL   Creatinine, Ser 2.30 (*) 0.50 - 1.10 mg/dL   Calcium 7.9 (*) 8.4 - 10.5 mg/dL   GFR calc non Af Amer 23 (*) >90 mL/min   GFR calc Af Amer 26 (*) >90 mL/min   Anion gap 13  5 - 15    Signed: Clinton Gallant, MD 12/09/2013, 1:30 PM  Attending physician discharge note: I personally examined and interviewed this patient on the day of discharge. Pleasant 58 year old woman with a recent 11/22/2013 admission for bronchitis. A trivial pericardial effusion seen on echocardiogram June 22.  She presented for a routine post hospital followup visit on June 26 complaining of persistent and progressive chest pain. Electrocardiogram showed diffuse low voltage without any acute ischemic change. She was admitted for further evaluation. She was started on a combination of prednisone and colchicine for clinical diagnosis of acute pericarditis. Initial followup echocardiogram done on June 27 showed no significant change in the small pericardial effusion compared with the prior study. However, a followup study done on June 29 showed  an increasing effusion now moderate to large size without any signs of hemodynamic compromise. The patient has a baseline tachycardia 100-110 which became more pronounced with rates over 120.  Cardiology evaluated the patient. In view of increasing symptoms and enlarging pericardial effusion, they in turn  involved cardiovascular surgery and decision was made to take the patient to surgery. A pericardial window with a drainage procedure was done on July 2 and approximately 700 mL's of hemorrhagic fluid was drained. Fluid had characteristics of a exudate with 6.8 g of protein. Cell count on the initial specimen 880 white cells 93% neutrophils, 7% lymphocytes. No malignant cells seen on cytology. The patient had a rapid and dramatic improvement in her overall status following this procedure. She is discharged in stable condition on tapering steroids and colchicine.  Murriel Hopper, MD, FACP  Hematology-Oncology/Internal Medicine    Services Ordered on Discharge:  Equipment Ordered on Discharge:

## 2013-12-02 NOTE — Progress Notes (Signed)
Patient: [name] [name] / Admit Date: 11/28/2013 / Date of Encounter: 12/02/2013, 10:59 AM  Subjective  Still feels SOB with exertion - feeling mildly better but not quite there yet. Anxious to find out what is causing all of this. Pulsus paradoxus measured at 5 (had to borrow manual small adult cuff from peds ER).  Objective   Telemetry: sinus tach 110-120s  Physical Exam: Blood pressure 133/92, pulse 119, temperature 98.5 F (36.9 C), temperature source Oral, resp. rate 18, height 5\' 1"  (1.549 m), weight 97 lb 3.6 oz (44.1 kg), SpO2 95.00%. General: Cachectic appearing AAF in no acute distress. Head: Normocephalic, atraumatic, sclera non-icteric, no xanthomas, nares are without discharge. Neck:  JVP remains elevated. Lungs: Decreased BS at bases. No wheezes, rales, or rhonchi. Breathing is unlabored. Heart: Reg rhythm, tachycardic, S1 S2 (easily audible - not muffled), without murmurs, rubs, or gallops.  Abdomen: Soft, non-tender, non-distended with normoactive bowel sounds. No rebound/guarding. Extremities: No clubbing or cyanosis. No edema. Distal pedal pulses are 2+ and equal bilaterally. Neuro: Alert and oriented X 3. Moves all extremities spontaneously. Psych:  Responds to questions appropriately with a normal affect.   Intake/Output Summary (Last 24 hours) at 12/02/13 1059 Last data filed at 12/02/13 0930  Gross per 24 hour  Intake    600 ml  Output   1100 ml  Net   -500 ml    Inpatient Medications:  . amLODipine  5 mg Oral Daily  . aspirin EC  81 mg Oral QPM  . calcium carbonate  6 tablet Oral TID  . colchicine  0.3 mg Oral Daily  . vitamin B-12  500 mcg Oral QPM  . feeding supplement (ENSURE)  1 Container Oral TID BM  . FLUoxetine  10 mg Oral Daily  . folic acid  1 mg Oral Daily  . furosemide  40 mg Oral Daily  . gabapentin  600 mg Oral TID  . heparin  5,000 Units Subcutaneous 3 times per day  . lidocaine  1 patch Transdermal Q24H  . lipase/protease/amylase  1  capsule Oral TID AC  . loratadine  10 mg Oral Daily  . magnesium oxide  400 mg Oral TID PC & HS  . magnesium sulfate 1 - 4 g bolus IVPB  2 g Intravenous Once  . multivitamin with minerals  1 tablet Oral Daily  . pantoprazole  40 mg Oral Daily  . predniSONE  20 mg Oral Q breakfast  . thiamine  100 mg Oral Daily  . [START ON 12/04/2013] Vitamin D (Ergocalciferol)  50,000 Units Oral Q7 days   Infusions:    Labs:  Recent Labs  12/01/13 0414 12/01/13 0700 12/02/13 0411  NA 136*  --  135*  K 3.8  --  4.3  CL 93*  --  91*  CO2 30  --  29  GLUCOSE 90  --  95  BUN 54*  --  55*  CREATININE 2.35*  --  2.10*  CALCIUM 8.1*  --  8.0*  MG  --  1.7 1.5   Radiology/Studies:  Dg Chest 2 View  11/28/2013   CLINICAL DATA:  increased sob  EXAM: CHEST  2 VIEW  COMPARISON:  Prior radiograph from 11/26/2013  FINDINGS: Cardiomegaly is stable as compared to prior exam.  Lungs are mildly hypoinflated. There is persistent diffuse pulmonary edema, slightly improved from prior. Bilateral pleural effusions are also slightly improved. No definite new focal infiltrate. There is no pneumothorax.  Chronic fracture deformity of the proximal  left humerus noted, unchanged. No acute osseous abnormality.  IMPRESSION: Slight interval improvement in diffuse pulmonary edema with decreased size of bilateral pleural effusions, consistent with improving CHF.   Electronically Signed   By: Jeannine Boga M.D.   On: 11/28/2013 21:47   Dg Chest 2 View  11/22/2013   CLINICAL DATA:  Two week history of cough  EXAM: CHEST  2 VIEW  COMPARISON:  Prior chest x-ray 09/25/2013  FINDINGS: Stable cardiac and mediastinal contours. Interval resolution of right lower lobe infiltrate compared to 09/25/2013. No new airspace consolidation. No pulmonary edema, pleural effusion or pneumothorax. No suspicious pulmonary nodule or mass. Stable background bronchitic changes. No acute osseous abnormality.  IMPRESSION: No active cardiopulmonary  disease.  Interval resolution of right lower lobe infiltrate compared to 09/25/2013.   Electronically Signed   By: Jacqulynn Cadet M.D.   On: 11/22/2013 10:29   Dg Chest Port 1 View  11/26/2013   CLINICAL DATA:  Pulmonary  EXAM: PORTABLE CHEST - 1 VIEW  COMPARISON:  11/25/2013  FINDINGS: Cardiac enlargement. Pulmonary vascular congestion and bilateral edema is unchanged. Bilateral effusion and bibasilar atelectasis also unchanged.  IMPRESSION: Congestive heart failure with edema and effusions unchanged from the prior study.   Electronically Signed   By: Franchot Gallo M.D.   On: 11/26/2013 07:34   Dg Chest Port 1 View  11/25/2013   CLINICAL DATA:  Followup pulmonary edema  EXAM: PORTABLE CHEST - 1 VIEW  COMPARISON:  11/24/2013  FINDINGS: Bilateral effusions with perihilar and more confluent lung base opacity is stable from the prior exam. Cardiac silhouette is mildly enlarged. No pneumothorax.  IMPRESSION: No significant change from the previous day's study. Findings consistent with pulmonary edema with bilateral effusions.   Electronically Signed   By: Lajean Manes M.D.   On: 11/25/2013 07:19   Dg Chest Port 1 View  11/24/2013   CLINICAL DATA:  Chest pain, shortness of breath.  EXAM: PORTABLE CHEST - 1 VIEW  COMPARISON:  November 22, 2013.  FINDINGS: Mild cardiomegaly is noted. Bilateral perihilar and basilar opacities are noted most consistent with pulmonary edema with mild associated pleural effusions. No pneumothorax is noted. Bony thorax is intact.  IMPRESSION: Bilateral perihilar and basilar opacities are noted most consistent with pulmonary edema. Mild bilateral pleural effusions are noted.   Electronically Signed   By: Sabino Dick M.D.   On: 11/24/2013 08:29     Assessment and Plan  1. Acute pericarditis: etiology includes viral pericarditis given recent exacerbation of bronchitis in setting of pericardial effusion vs autoimmune disease (ANA significantly abnormal, family history of SLE). Would  also recommend primary team consider some sort of malignancy leading to cachexia, decreased appetite, weight loss, etc. She is stable symptomatically with prednisone and colchicine but still SOB with exertion and with sinus tach. Would continue colchicine at 0.3 given renal function and prednisone taper. No NSAIDs with CKD. F/u limited echo 6/29 showed moderate-to-large effusion without evidence of hemodynamic compromise - will repeat today since it had increased in size from admission to yesterday. Will also recheck CXR. Given chronic appearing sinus tach will also check thyroid function.  2. Leukocytosis/anemia: per IM. Suspect WBC from steroids. ?Recheck CBC to reassess Hgb - this could also be contributing to her sinus tach and SOB.  3. Acute on chronic diastolic heart failure: Would avoid further IV Lasix for now, can continue po.   4. HTN: Controlled on present therapy. Would not aggressively treat further due to pericardial effusion.  5. CKD stage IV: Creatinine stably elevated. Avoiding NSAIDs or further aggressive diuresis.  6. Hypomagnesemia: primary team is managing lytes. PPI can contribute to low Mg so could consider changing to H2 blocker if appropriate.  Signed, Melina Copa PA-C Patient seen and examined. I agree with the assessment and plan as detailed above. See also my additional thoughts below.   Etiology of pericardial effusion remains unknown. As of yesterday, the effusion was larger, but not hemodynamically significant. Her pulsus is not increased by carefull evaluation. She has chronic sinus tachycardia plus anemia. However, she has continued neck vein elevation. If there is a component of CHF, we are limited because further diuresis might lead to a greater effect from the pericardial effusion. Chest Xray is to be repeated and another limited 2Decho to look again at effusion. I am not pushing for pericardiocentesis this AM. But I am inclined to move toward pericardial tap if any  parameters suggest worsening. Sharrell Ku, PA-C will talk with our echo reading doctor as the day goes on today to help with the ongoing assessment.  Dola Argyle, MD, [facility] 12/02/2013 11:58 AM

## 2013-12-02 NOTE — Progress Notes (Deleted)
Chaplain from [facility] requested a chaplain visited to this pt.  She was recently transferred from [facility] to here. Chaplin visited pt and pt was upset with family member. Chaplain sat with pt and encouraged her to calm down, because she was having difficulty breathing. Pt provide spiritual support and advised pt if further services was needed to please have chaplain paged.  Charyl Dancer,  Chaplain  12/02/13 1900  Clinical Encounter Type  Visited With Patient  Visit Type Spiritual support  Referral From Chaplain  Consult/Referral To Chaplain  Spiritual Encounters  Spiritual Needs Emotional;Prayer  Stress Factors  Patient Stress Factors Family relationships  Family Stress Factors None identified

## 2013-12-02 NOTE — Progress Notes (Signed)
Echo looks unchanged. No signs of hemodynamic compromise. Dr. Meda Coffee recommending to recommend to repeat echocardiogram in 2 days. Dayna Dunn PA-C

## 2013-12-02 NOTE — Progress Notes (Signed)
  Date: 12/02/2013  Patient name: [name] [name]  Medical record number: [id number]  Date of birth: February 28, 1956   This patient has been seen and the plan of care was discussed with the house staff. Please see their note for complete details. I concur with their findings with the following additions/corrections:  Patient with acute on chronic anemia.  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  Her last H/H was slightly below baseline, checking CBC today and consider transfusion as indicated given persistent SOB and tachycardia (mildly worse today).  She continues to have chest pain and cardiology is following closely for signs of tamponade given her slightly enlarged pericardial effusion.  Limited TTE is planned for today as well.   Sid Falcon, MD 12/02/2013, 12:42 PM

## 2013-12-02 NOTE — Progress Notes (Signed)
Echocardiogram 2D Echocardiogram limited has been performed.  [name] [name] 12/02/2013, 12:36 PM

## 2013-12-02 NOTE — Progress Notes (Signed)
Nurse made a referral for pt.to have a visit from the chaplin. Chaplin visited pt. Pt was in a sad mood. She stated she felt lonely and alone. She has been in the hospital and has had only one visit from her daughter of about 15 min. She states that none of her 11 siblings have either called or came to visit. Chaplin visited with pt for about 30 mins. Chaplin provided a listening ear, emotional support and prayer.   Charyl Dancer, Chaplain  12/02/13 1600  Clinical Encounter Type  Visited With Patient  Visit Type Spiritual support;Social support  Referral From Nurse  Consult/Referral To Chaplain  Spiritual Encounters  Spiritual Needs Prayer;Emotional  Stress Factors  Patient Stress Factors Family relationships;Health changes  Family Stress Factors None identified

## 2013-12-02 NOTE — Progress Notes (Signed)
Subjective: Patient seen at bedside this AM. Complaining of some mild chest pain, says she feels somewhat worse today. No SOB, however, does feel palpitations, tachycardic on exam. Still w/ friction rub and JVD.   Tearful on exam today b/c family has not come to see her.   Objective: Vital signs in last 24 hours: Filed Vitals:   12/01/13 0700 12/01/13 1400 12/01/13 2118 12/02/13 0622  BP:  120/76 145/98 133/92  Pulse:  108 110 119  Temp:  98.6 F (37 C) 98.1 F (36.7 C) 98.5 F (36.9 C)  TempSrc:  Oral Oral Oral  Resp:  20 18 18   Height: 5\' 1"  (1.549 m)     Weight:    97 lb 3.6 oz (44.1 kg)  SpO2:  92% 98% 95%   Weight change: 3.5 oz (0.1 kg)  Intake/Output Summary (Last 24 hours) at 12/02/13 1610 Last data filed at 12/01/13 1900  Gross per 24 hour  Intake    960 ml  Output    825 ml  Net    135 ml   Physical Exam: General: Alert, cooperative, NAD. HEENT: PERRL, EOMI. Moist mucus membranes Neck: Full range of motion without pain, supple, no lymphadenopathy or carotid bruits. JVD to jaw line.  Lungs: Normal work of respiration. Bibasilar crackles. No wheezes. Heart: Tachycardic, RR. No murmurs or gallops. Friction rub present. Abdomen: Soft, non-tender, non-distended, BS + Extremities: No cyanosis, clubbing, or edema Neurologic: Alert & oriented X3, cranial nerves II-XII intact, strength grossly intact, sensation intact to light touch   Lab Results: Basic Metabolic Panel:  Recent Labs Lab 11/28/13 1112  11/29/13 0310  12/01/13 0414 12/01/13 0700 12/02/13 0411  NA 137  < >  --   < > 136*  --  135*  K 4.2  < >  --   < > 3.8  --  4.3  CL 95*  < >  --   < > 93*  --  91*  CO2 24  < >  --   < > 30  --  29  GLUCOSE 114*  < >  --   < > 90  --  95  BUN 51*  < >  --   < > 54*  --  55*  CREATININE 2.56*  < >  --   < > 2.35*  --  2.10*  CALCIUM 7.9*  < >  --   < > 8.1*  --  8.0*  MG 1.1*  < >  --   < >  --  1.7 1.5  PHOS 2.2*  --  3.0  --   --   --   --   < > =  values in this interval not displayed. Liver Function Tests:  Recent Labs Lab 11/28/13 1112 11/29/13 0143  AST  --  37  ALT  --  22  ALKPHOS  --  294*  BILITOT  --  <0.2*  PROT  --  7.5  ALBUMIN 2.3* 2.0*   CBC:  Recent Labs Lab 11/28/13 1112 11/29/13 0720  WBC 14.2* 17.1*  NEUTROABS  --  14.3*  HGB 8.9* 7.9*  HCT 27.0* 24.1*  MCV 97.1 98.4  PLT 236 236   Cardiac Enzymes:  Recent Labs Lab 11/28/13 2050 11/29/13 0133 11/29/13 0720  TROPONINI <0.30 <0.30 <0.30   BNP:  Recent Labs Lab 11/28/13 2050  PROBNP 17837.0*   CBG:  Recent Labs Lab 11/25/13 1401 11/25/13 1605 11/25/13 1935 11/26/13 0812 11/26/13 1215  GLUCAP 271* 294* 335* 114* 146*   Hemoglobin A1C:  Recent Labs Lab 11/28/13 1152  HGBA1C 5.4   Anemia Panel:  Recent Labs Lab 11/26/13 1030  FERRITIN 1035*  TIBC 138*  IRON 102   Urine Drug Screen: Drugs of Abuse     Component Value Date/Time   LABOPIA POSITIVE* 11/22/2013 1406   LABOPIA NEG 09/18/2011 0936   COCAINSCRNUR NONE DETECTED 11/22/2013 1406   COCAINSCRNUR NEG 09/18/2011 0936   LABBENZ NONE DETECTED 11/22/2013 1406   LABBENZ NEG 09/18/2011 0936   LABBENZ NEG 04/10/2011 1130   AMPHETMU NONE DETECTED 11/22/2013 1406   AMPHETMU NEG 04/10/2011 1130   THCU NONE DETECTED 11/22/2013 1406   LABBARB NONE DETECTED 11/22/2013 1406   LABBARB NEG 09/18/2011 0936    Urinalysis:  Recent Labs Lab 11/29/13 0203  COLORURINE YELLOW  LABSPEC 1.010  PHURINE 6.0  GLUCOSEU NEGATIVE  HGBUR NEGATIVE  BILIRUBINUR NEGATIVE  KETONESUR NEGATIVE  PROTEINUR NEGATIVE  UROBILINOGEN 0.2  NITRITE NEGATIVE  LEUKOCYTESUR NEGATIVE    Micro Results: Recent Results (from the past 240 hour(s))  CULTURE, BLOOD (ROUTINE X 2)     Status: None   Collection Time    11/24/13  8:30 AM      Result Value Ref Range Status   Specimen Description BLOOD RIGHT ARM   Final   Special Requests BOTTLES DRAWN AEROBIC ONLY 1CC   Final   Culture  Setup Time     Final    Value: 11/24/2013 14:18     Performed at [facility]   Culture     Final   Value: NO GROWTH 5 DAYS     Performed at [facility]   Report Status 11/30/2013 FINAL   Final  CULTURE, BLOOD (ROUTINE X 2)     Status: None   Collection Time    11/24/13  8:34 AM      Result Value Ref Range Status   Specimen Description BLOOD LEFT HAND   Final   Special Requests BOTTLES DRAWN AEROBIC ONLY 3CC   Final   Culture  Setup Time     Final   Value: 11/24/2013 14:16     Performed at [facility]   Culture     Final   Value: NO GROWTH 5 DAYS     Performed at [facility]   Report Status 11/30/2013 FINAL   Final  MRSA PCR SCREENING     Status: None   Collection Time    11/24/13 11:28 AM      Result Value Ref Range Status   MRSA by PCR NEGATIVE  NEGATIVE Final   Comment:            The GeneXpert MRSA Assay (FDA     approved for NASAL specimens     only), is one component of a     comprehensive MRSA colonization     surveillance program. It is not     intended to diagnose MRSA     infection nor to guide or     monitor treatment for     MRSA infections.   Studies/Results: No results found. Medications: I have reviewed the patient's current medications. Scheduled Meds: . amLODipine  5 mg Oral Daily  . aspirin EC  81 mg Oral QPM  . calcium carbonate  6 tablet Oral TID  . colchicine  0.3 mg Oral Daily  . vitamin B-12  500 mcg Oral QPM  . feeding supplement (ENSURE)  1 Container  Oral TID BM  . FLUoxetine  10 mg Oral Daily  . folic acid  1 mg Oral Daily  . furosemide  40 mg Oral Daily  . gabapentin  600 mg Oral TID  . heparin  5,000 Units Subcutaneous 3 times per day  . lidocaine  1 patch Transdermal Q24H  . lipase/protease/amylase  1 capsule Oral TID AC  . loratadine  10 mg Oral Daily  . magnesium oxide  400 mg Oral TID PC & HS  . magnesium sulfate 1 - 4 g bolus IVPB  2 g Intravenous Once  . multivitamin with minerals  1 tablet Oral Daily  .  pantoprazole  40 mg Oral Daily  . predniSONE  20 mg Oral Q breakfast  . thiamine  100 mg Oral Daily  . [START ON 12/04/2013] Vitamin D (Ergocalciferol)  50,000 Units Oral Q7 days   Continuous Infusions:  PRN Meds:.acetaminophen, guaiFENesin-codeine  Assessment/Plan: [name] is a 58 y.o. 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   Acute pericarditis- Patient w/ pleuritic chest pain this AM, still w/ JVD and friction rub. Tachycardic on exam, EKG generally unchanged from yesterday, however, voltage appears to be decreased. No obvious electrical alternans. ECHO from yesterday (12/01/13) showed a moderate to large free-flowing pericardial effusion w/ no internal echoes and still with no evidence of hemodynamic compromise. However, since prior study, pericardial effusion had increased in size. Discussed w/ cardiology yesterday, advised watching the patient clinically overnight. This AM, still unchanged if not worse clinically w/ chest pain, and worsened tachycardia. Found to be ANA positive yesterday, titer 1:320.  -Cardiology following; appreciate recs. For possible pericardiocentesis today.  -Continue Prednisone 20 mg qd for 2 weeks total (11/28/2013 - 12/11/2013), followed by tapered regimen as detailed in previous note.  -Continue Colchicine 0.3 mg po qd for 3 months (end date 02/28/14)  Acute on Chronic dCHF- Improved. Still w/ bibasilar crackles and JVD. On Lasix 40 mg qd. Patient is net -4.9L since admission. -Continue Lasix 40 mg qd -Daily weights and strict I&Os   Hypomagnesemia- 1.5 this AM.  -Given 2g MgSO4 this AM -Continue Mag Oxide 400 mg tid during admission.  -Will discharge on Mag Oxide 400 mg bid for better compliance.  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 Found to be  higher during her recent admission and has remained elevated. Improved this AM to 2.10, Cr trend as follows:   Recent Labs Lab 11/29/13 0143 11/29/13 0720 11/30/13 0312 12/01/13 0414 12/02/13 0411  CREATININE 2.58* 2.52* 2.47* 2.35* 2.10*  -Continue Lasix as above.  -Outpatient renal follow up; has appointment w/ Dr. Justin Mend tomorrow (12/03/13).  AG metabolic acidosis- Resolved, HCO3 29 this AM. Most likely 2/2 worsening CKD. Held NaHCO3 yesterday.  -Continue to hold NaHCO3 -Repeat BMP in AM  Anemia of chronic disease- Hgb 8.9 at admission, close to baseline of 8-10. Associated w/ CKD.  -Continue to monitor.   HTN- BP controlled.  -Continue Amlodipine 5 mg po qd  Diet- Renal w/1219mL fluid restriction   DVT/PE PPx- Heparin [place]  Dispo: Unknown at this time.   The patient does have a current PCP Otho Bellows, MD) and does need an [facility] hospital follow-up appointment after discharge.  The patient does not have transportation limitations that hinder transportation to clinic appointments.  .Services Needed at time  of discharge: Y = Yes, Blank = No PT:   OT:   RN: Y  Equipment:   Other:     LOS: 4 days   Corky Sox, MD 12/02/2013, 7:22 AM

## 2013-12-03 ENCOUNTER — Encounter (HOSPITAL_COMMUNITY)
Admission: AD | Disposition: A | Discharge status: Home Health | Payer: Self-pay | Source: Ambulatory Visit | Attending: Internal Medicine

## 2013-12-03 DIAGNOSIS — I3139 Other pericardial effusion (noninflammatory): Secondary | ICD-10-CM

## 2013-12-03 DIAGNOSIS — I313 Pericardial effusion (noninflammatory): Secondary | ICD-10-CM

## 2013-12-03 LAB — BASIC METABOLIC PANEL
BUN: 58 mg/dL — AB (ref 6–23)
CALCIUM: 8.4 mg/dL (ref 8.4–10.5)
CO2: 30 mEq/L (ref 19–32)
Chloride: 87 mEq/L — ABNORMAL LOW (ref 96–112)
Creatinine, Ser: 2.37 mg/dL — ABNORMAL HIGH (ref 0.50–1.10)
GFR calc Af Amer: 25 mL/min — ABNORMAL LOW (ref >90)
GFR, EST NON AFRICAN AMERICAN: 22 mL/min — AB (ref >90)
Glucose, Bld: 134 mg/dL — ABNORMAL HIGH (ref 70–99)
Potassium: 4.5 mEq/L (ref 3.7–5.3)
Sodium: 130 mEq/L — ABNORMAL LOW (ref 137–147)

## 2013-12-03 LAB — CBC
HCT: 24.3 % — ABNORMAL LOW (ref 36.0–46.0)
Hemoglobin: 7.7 g/dL — ABNORMAL LOW (ref 12.0–15.0)
MCH: 31.8 pg (ref 26.0–34.0)
MCHC: 31.7 g/dL (ref 30.0–36.0)
MCV: 100.4 fL — ABNORMAL HIGH (ref 78.0–100.0)
PLATELETS: 369 10*3/uL (ref 150–400)
RBC: 2.42 MIL/uL — ABNORMAL LOW (ref 3.87–5.11)
RDW: 15.4 % (ref 11.5–15.5)
WBC: 13.9 10*3/uL — ABNORMAL HIGH (ref 4.0–10.5)

## 2013-12-03 LAB — MAGNESIUM: MAGNESIUM: 2.3 mg/dL (ref 1.5–2.5)

## 2013-12-03 SURGERY — PERICARDIAL TAP
Anesthesia: LOCAL

## 2013-12-03 MED ORDER — PROMETHAZINE HCL 25 MG/ML IJ SOLN
12.5000 mg | Freq: Once | INTRAMUSCULAR | Status: AC
  Administered 2013-12-03: 12.5 mg via INTRAVENOUS
  Filled 2013-12-03: qty 1

## 2013-12-03 NOTE — Progress Notes (Addendum)
Subjective: Patient complains of mild chest pain and SOB. She states she feels worse compared to yesterday. She complains of feeling anxious.   Objective: Vital signs in last 24 hours: Filed Vitals:   12/02/13 0622 12/02/13 1350 12/02/13 2015 12/03/13 0459  BP: 133/92 130/90 145/99 92/61  Pulse: 119 117 122 120  Temp: 98.5 F (36.9 C) 97.6 F (36.4 C) 98.1 F (36.7 C) 98 F (36.7 C)  TempSrc: Oral Oral Oral Oral  Resp: 18 18 18 18   Height:      Weight: 44.1 kg (97 lb 3.6 oz)   44.2 kg (97 lb 7.1 oz)  SpO2: 95% 97% 93% 92%   Weight change: 0.1 kg (3.5 oz)  Intake/Output Summary (Last 24 hours) at 12/03/13 1200 Last data filed at 12/03/13 0900  Gross per 24 hour  Intake    240 ml  Output    800 ml  Net   -560 ml   Physical Exam General: Alert, cooperative. Pt appears anxious, uncomfortable.  HEENT: N/C, A/T, sclera anicteric, mucus membranes moist Neck: JVD to jawline present bilaterally, no lymphadenopathy CV: tachycardic, normal S1/S2, friction rub heard, pulsus alternans present Pulm: CTA bilaterally, respirations nonlabored GI: BS+, nontender, nondistended Ext: moves all, warm, no edema  Lab Results: CBC    Component Value Date/Time   WBC 13.9* 12/03/2013 0550   WBC 7.2 10/26/2011 1336   RBC 2.42* 12/03/2013 0550   RBC 2.49* 08/09/2012 1040   RBC 2.98* 10/26/2011 1336   HGB 7.7* 12/03/2013 0550   HGB 9.7* 10/26/2011 1336   HCT 24.3* 12/03/2013 0550   HCT 28.8* 10/26/2011 1336   PLT 369 12/03/2013 0550   PLT 125* 10/26/2011 1336   MCV 100.4* 12/03/2013 0550   MCV 96.6 10/26/2011 1336   MCH 31.8 12/03/2013 0550   MCH 32.6 10/26/2011 1336   MCHC 31.7 12/03/2013 0550   MCHC 33.7 10/26/2011 1336   RDW 15.4 12/03/2013 0550   RDW 13.6 10/26/2011 1336   LYMPHSABS 1.4 11/29/2013 0720   LYMPHSABS 2.7 10/26/2011 1336   MONOABS 1.5* 11/29/2013 0720   MONOABS 0.8 10/26/2011 1336   EOSABS 0.0 11/29/2013 0720   EOSABS 0.1 10/26/2011 1336   BASOSABS 0.0 11/29/2013 0720   BASOSABS 0.0 10/26/2011  1336   BMET    Component Value Date/Time   NA 130* 12/03/2013 0550   K 4.5 12/03/2013 0550   CL 87* 12/03/2013 0550   CO2 30 12/03/2013 0550   GLUCOSE 134* 12/03/2013 0550   BUN 58* 12/03/2013 0550   CREATININE 2.37* 12/03/2013 0550   CREATININE 2.56* 11/28/2013 1112   CALCIUM 8.4 12/03/2013 0550   CALCIUM 5.6* 12/22/2010 1010   GFRNONAA 22* 12/03/2013 0550   GFRNONAA 50* 08/14/2013 0958   GFRAA 25* 12/03/2013 0550   GFRAA 58* 08/14/2013 0958   Micro Results: Recent Results (from the past 240 hour(s))  CULTURE, BLOOD (ROUTINE X 2)     Status: None   Collection Time    11/24/13  8:30 AM      Result Value Ref Range Status   Specimen Description BLOOD RIGHT ARM   Final   Special Requests BOTTLES DRAWN AEROBIC ONLY 1CC   Final   Culture  Setup Time     Final   Value: 11/24/2013 14:18     Performed at [facility]   Culture     Final   Value: NO GROWTH 5 DAYS     Performed at [facility]   Report Status  11/30/2013 FINAL   Final  CULTURE, BLOOD (ROUTINE X 2)     Status: None   Collection Time    11/24/13  8:34 AM      Result Value Ref Range Status   Specimen Description BLOOD LEFT HAND   Final   Special Requests BOTTLES DRAWN AEROBIC ONLY 3CC   Final   Culture  Setup Time     Final   Value: 11/24/2013 14:16     Performed at [facility]   Culture     Final   Value: NO GROWTH 5 DAYS     Performed at [facility]   Report Status 11/30/2013 FINAL   Final  MRSA PCR SCREENING     Status: None   Collection Time    11/24/13 11:28 AM      Result Value Ref Range Status   MRSA by PCR NEGATIVE  NEGATIVE Final   Comment:            The GeneXpert MRSA Assay (FDA     approved for NASAL specimens     only), is one component of a     comprehensive MRSA colonization     surveillance program. It is not     intended to diagnose MRSA     infection nor to guide or     monitor treatment for     MRSA infections.   Studies/Results: Dg Chest 2 View  12/02/2013   CLINICAL  DATA:  Shortness of breath  EXAM: CHEST  2 VIEW  COMPARISON:  11/28/2013  FINDINGS: Cardiac shadow is again enlarged. Bibasilar infiltrates are seen left greater than right with associated effusion. This is increased slightly in the interval from the prior exam particularly in the left lower lobe.  IMPRESSION: Slight increase in the degree of basilar infiltrates with associated effusions.   Electronically Signed   By: Inez Catalina M.D.   On: 12/02/2013 15:44   Medications: I have reviewed the patient's current medications. Scheduled Meds: . amLODipine  5 mg Oral Daily  . aspirin EC  81 mg Oral QPM  . calcium carbonate  6 tablet Oral TID  . colchicine  0.3 mg Oral Daily  . vitamin B-12  500 mcg Oral QPM  . feeding supplement (ENSURE)  1 Container Oral TID BM  . FLUoxetine  10 mg Oral Daily  . folic acid  1 mg Oral Daily  . furosemide  40 mg Oral Daily  . gabapentin  600 mg Oral TID  . heparin  5,000 Units Subcutaneous 3 times per day  . lidocaine  1 patch Transdermal Q24H  . lipase/protease/amylase  1 capsule Oral TID AC  . loratadine  10 mg Oral Daily  . magnesium oxide  400 mg Oral TID PC & HS  . multivitamin with minerals  1 tablet Oral Daily  . pantoprazole  40 mg Oral Daily  . predniSONE  20 mg Oral Q breakfast  . thiamine  100 mg Oral Daily  . [START ON 12/04/2013] Vitamin D (Ergocalciferol)  50,000 Units Oral Q7 days   Continuous Infusions:  PRN Meds:.acetaminophen Assessment/Plan: [name] [name] is a 58yo woman with PMHx of HTN, CKD 2/2 to FSGS, grade 2 dCHF, hypomagnesemia, anemia of chronic disease, chronic thrombocytopenia, and depression who was admitted for acute pericarditis with pericardial effusion.  1. Acute pericarditis with pericardial effusion: Etiology for pericarditis most likely viral considering patient had acute bronchitis a few weeks ago. Concern for malignancy as possible etiology less likely  since patient did not have lesions on CXR and has no hx of cancer (most  common usually breast-normal mammo 2014) or lung- as above, neg CXR). Currently patient has mild chest pain, SOB, tachycardia in 120s, JVD, friction rub, pulsus alternans on exam, which has worsened since yesterday. EKG shows low voltage. Last echo on 12/02/13 showed no changes from previous echo on 12/01/13, which indicated an increased pericardial effusion compared to baseline echo on 11/19/13. No tamponade present.  - Cardiology is performing pericardiocentesis today. Will f/u pericardial fluid measures. Will order cell count for fluid. - Depending upon pericardiocentesis today and patient's symptoms, may consider repeat echo tomorrow (12/04/13) - Continue Colchicine 0.3mg  PO QD for 3 months (end date 02/28/14) - Continue Prednisone 20mg  QD for 2 weeks total (11/28/13-12/11/13) followed by tapered regimen  2. Acute on chronic dCHF: Patient has been receiving serial echos for the pericardial effusion, which exacerbated her CHF. Her CHF has improved since admission.    - +JVD on exam - Continue Lasix 40mg  QD - Continue daily weights and strict I&Os  3. Hypomagnesemia - Mg 2.3 this AM - Will order repeat Mg for tomorrow - Continue Mag Oxide 400mg  TID - Will d/c on Mag Oxide 400mg  BID  4. 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 Cr has been higher this admission, remaining elevated above 2.0. Cr has improved to 2.37 (2.10 yesterday) - Continue Lasix 40mg  QD - Pt had outpatient renal f/u appt with Dr. Justin Mend today. Called Dr. Jason Nest office to let them know appt will be rescheduled.  5. AG metabolic acidosis - Resolved.HCO3 improved to 30 (29 yesterday). Most likely 2/2 CKD. - Continue to hold HCO3 - Repeat BMP  6. Anemia of chronic disease - Hbg 8.9 on admission. Baseline is 8-10.  - Hbg at 7.7 today.  - Associated with CKD - Continue to monitor  7. HTN - BP controlled, 130-140s/80-90s - Continue amlodipine 5mg  PO QD  Diet: Renal with 123ml fluid restriction DVT Prophy: Heparin  [place]  Dispo: Disposition is deferred at this time, awaiting improvement of current medical problems.     The patient does have a current PCP Otho Bellows, MD) and does need an [facility] hospital follow-up appointment after discharge.  The patient does not have transportation limitations that hinder transportation to clinic appointments.  .Services Needed at time of discharge: Y = Yes, Blank = No PT:   OT:   RN:   Equipment:   Other:     LOS: 5 days   Albin Felling, MD Internal Medicine Resident, PGY1 12/03/2013, 12:00 PM Attending physician note: I personally examined this patient together with the medical resident team and I discussed her management in person with cardiology. Physical findings, problem list, assessment, and plan, all accurate as recorded above. I was not able to appreciate a pericardial friction rub on my exam today. The patient continues to have symptomatic dyspnea and tachycardia at rest. Serial echocardiograms show a stable but moderate to large pericardial effusion with no signs of hemodynamic compromise through most recent study done yesterday June 30. She is receiving steroids and colchicine now day #6. There is no clinical suspicion for underlying malignancy to explain her pericardial effusion. Plan is for elective pericardiocentesis tomorrow July 2.  Murriel Hopper, MD, [place]  Hematology-Oncology/Internal Medicine

## 2013-12-03 NOTE — Progress Notes (Signed)
I was informed by cath lab team that pericardiocentesis was r/s to tomorrow with Dr. Angelena Form due to cath schedule. Dr. Ron Parker and patient aware.  Shelby Anderle PA-C

## 2013-12-03 NOTE — Progress Notes (Signed)
[name], female   DOB: 1955-12-05, 58 y.o.   MRN: [id number]    SUBJECTIVE:  The patient feels about the same today. I have had a careful discussion with Dr. Beryle Beams. He is concerned with her persistent sinus tachycardia and elevated neck veins. I agree. We will proceed with pericardiocentesis today.   Filed Vitals:   12/02/13 0622 12/02/13 1350 12/02/13 2015 12/03/13 0459  BP: 133/92 130/90 145/99 92/61  Pulse: 119 117 122 120  Temp: 98.5 F (36.9 C) 97.6 F (36.4 C) 98.1 F (36.7 C) 98 F (36.7 C)  TempSrc: Oral Oral Oral Oral  Resp: 18 18 18 18   Height:      Weight: 97 lb 3.6 oz (44.1 kg)   97 lb 7.1 oz (44.2 kg)  SpO2: 95% 97% 93% 92%     Intake/Output Summary (Last 24 hours) at 12/03/13 0940 Last data filed at 12/03/13 0900  Gross per 24 hour  Intake    550 ml  Output    800 ml  Net   -250 ml    LABS: Basic Metabolic Panel:  Recent Labs  12/02/13 0411 12/03/13 0550  NA 135* 130*  K 4.3 4.5  CL 91* 87*  CO2 29 30  GLUCOSE 95 134*  BUN 55* 58*  CREATININE 2.10* 2.37*  CALCIUM 8.0* 8.4  MG 1.5 2.3   Liver Function Tests: No results found for this basename: AST, ALT, ALKPHOS, BILITOT, PROT, ALBUMIN,  in the last 72 hours No results found for this basename: LIPASE, AMYLASE,  in the last 72 hours CBC:  Recent Labs  12/02/13 1250 12/03/13 0550  WBC 13.1* 13.9*  HGB 8.0* 7.7*  HCT 25.5* 24.3*  MCV 101.6* 100.4*  PLT 353 369   Cardiac Enzymes: No results found for this basename: CKTOTAL, CKMB, CKMBINDEX, TROPONINI,  in the last 72 hours BNP: No components found with this basename: POCBNP,  D-Dimer: No results found for this basename: DDIMER,  in the last 72 hours Hemoglobin A1C: No results found for this basename: HGBA1C,  in the last 72 hours Fasting Lipid Panel: No results found for this basename: CHOL, HDL, LDLCALC, TRIG, CHOLHDL, LDLDIRECT,  in the last 72 hours Thyroid Function Tests:  Recent Labs  12/02/13 1250    TSH 0.897    RADIOLOGY: Dg Chest 2 View  12/02/2013   CLINICAL DATA:  Shortness of breath  EXAM: CHEST  2 VIEW  COMPARISON:  11/28/2013  FINDINGS: Cardiac shadow is again enlarged. Bibasilar infiltrates are seen left greater than right with associated effusion. This is increased slightly in the interval from the prior exam particularly in the left lower lobe.  IMPRESSION: Slight increase in the degree of basilar infiltrates with associated effusions.   Electronically Signed   By: Inez Catalina M.D.   On: 12/02/2013 15:44   Dg Chest 2 View  11/28/2013   CLINICAL DATA:  increased sob  EXAM: CHEST  2 VIEW  COMPARISON:  Prior radiograph from 11/26/2013  FINDINGS: Cardiomegaly is stable as compared to prior exam.  Lungs are mildly hypoinflated. There is persistent diffuse pulmonary edema, slightly improved from prior. Bilateral pleural effusions are also slightly improved. No definite new focal infiltrate. There is no pneumothorax.  Chronic fracture deformity of the proximal left humerus noted, unchanged. No acute osseous abnormality.  IMPRESSION: Slight interval improvement in diffuse pulmonary edema with decreased size of bilateral pleural effusions, consistent with improving CHF.   Electronically Signed   By: Jeannine Boga  M.D.   On: 11/28/2013 21:47   Dg Chest 2 View  11/22/2013   CLINICAL DATA:  Two week history of cough  EXAM: CHEST  2 VIEW  COMPARISON:  Prior chest x-ray 09/25/2013  FINDINGS: Stable cardiac and mediastinal contours. Interval resolution of right lower lobe infiltrate compared to 09/25/2013. No new airspace consolidation. No pulmonary edema, pleural effusion or pneumothorax. No suspicious pulmonary nodule or mass. Stable background bronchitic changes. No acute osseous abnormality.  IMPRESSION: No active cardiopulmonary disease.  Interval resolution of right lower lobe infiltrate compared to 09/25/2013.   Electronically Signed   By: Jacqulynn Cadet M.D.   On: 11/22/2013 10:29    Dg Chest Port 1 View  11/26/2013   CLINICAL DATA:  Pulmonary  EXAM: PORTABLE CHEST - 1 VIEW  COMPARISON:  11/25/2013  FINDINGS: Cardiac enlargement. Pulmonary vascular congestion and bilateral edema is unchanged. Bilateral effusion and bibasilar atelectasis also unchanged.  IMPRESSION: Congestive heart failure with edema and effusions unchanged from the prior study.   Electronically Signed   By: Franchot Gallo M.D.   On: 11/26/2013 07:34   Dg Chest Port 1 View  11/25/2013   CLINICAL DATA:  Followup pulmonary edema  EXAM: PORTABLE CHEST - 1 VIEW  COMPARISON:  11/24/2013  FINDINGS: Bilateral effusions with perihilar and more confluent lung base opacity is stable from the prior exam. Cardiac silhouette is mildly enlarged. No pneumothorax.  IMPRESSION: No significant change from the previous day's study. Findings consistent with pulmonary edema with bilateral effusions.   Electronically Signed   By: Lajean Manes M.D.   On: 11/25/2013 07:19   Dg Chest Port 1 View  11/24/2013   CLINICAL DATA:  Chest pain, shortness of breath.  EXAM: PORTABLE CHEST - 1 VIEW  COMPARISON:  November 22, 2013.  FINDINGS: Mild cardiomegaly is noted. Bilateral perihilar and basilar opacities are noted most consistent with pulmonary edema with mild associated pleural effusions. No pneumothorax is noted. Bony thorax is intact.  IMPRESSION: Bilateral perihilar and basilar opacities are noted most consistent with pulmonary edema. Mild bilateral pleural effusions are noted.   Electronically Signed   By: Sabino Dick M.D.   On: 11/24/2013 08:29    PHYSICAL EXAM   the patient is weak and frail. She's lying flat in bed. She is oriented to person time and place. Affect is normal. Lungs reveal a few scattered rhonchi. Cardiac exam reveals S1 and S2. The abdomen is soft. There is no peripheral edema.   ASSESSMENT AND PLAN:    Acute pericarditis, unspecified  Pericardial effusion     The patient has a large pericardial effusion. We have  been watching a very carefully. We have not seen definite hemodynamic compromise parameters by echo. However, the patient has persistent sinus tachycardia. She has a large effusion. She has elevated neck veins. I have decided with Dr. Beryle Beams that we will proceed with pericardiocentesis today. This will be done both on a therapeutic and diagnostic basis. The fluids should be sent for all diagnostic parameters that can be obtained from pericardial fluid.    Dola Argyle 12/03/2013 9:40 AM

## 2013-12-04 ENCOUNTER — Encounter (HOSPITAL_COMMUNITY): Payer: PRIVATE HEALTH INSURANCE | Admitting: Critical Care Medicine

## 2013-12-04 ENCOUNTER — Inpatient Hospital Stay (HOSPITAL_COMMUNITY): Payer: PRIVATE HEALTH INSURANCE

## 2013-12-04 ENCOUNTER — Inpatient Hospital Stay (HOSPITAL_COMMUNITY): Payer: PRIVATE HEALTH INSURANCE | Admitting: Critical Care Medicine

## 2013-12-04 ENCOUNTER — Other Ambulatory Visit: Payer: Self-pay | Admitting: *Deleted

## 2013-12-04 ENCOUNTER — Encounter (HOSPITAL_COMMUNITY)
Admission: AD | Disposition: A | Discharge status: Home Health | Payer: Self-pay | Source: Ambulatory Visit | Attending: Internal Medicine

## 2013-12-04 DIAGNOSIS — I319 Disease of pericardium, unspecified: Secondary | ICD-10-CM

## 2013-12-04 HISTORY — PX: INTRAOPERATIVE TRANSESOPHAGEAL ECHOCARDIOGRAM: SHX5062

## 2013-12-04 HISTORY — PX: SUBXYPHOID PERICARDIAL WINDOW: SHX5075

## 2013-12-04 LAB — BASIC METABOLIC PANEL
Anion gap: 19 — ABNORMAL HIGH (ref 5–15)
Anion gap: 14 (ref 5–15)
BUN: 69 mg/dL — ABNORMAL HIGH (ref 6–23)
BUN: 71 mg/dL — ABNORMAL HIGH (ref 6–23)
CO2: 24 mEq/L (ref 19–32)
CO2: 24 mEq/L (ref 19–32)
Calcium: 8.8 mg/dL (ref 8.4–10.5)
Calcium: 7.7 mg/dL — ABNORMAL LOW (ref 8.4–10.5)
Chloride: 86 mEq/L — ABNORMAL LOW (ref 96–112)
Chloride: 89 mEq/L — ABNORMAL LOW (ref 96–112)
Creatinine, Ser: 2.85 mg/dL — ABNORMAL HIGH (ref 0.50–1.10)
Creatinine, Ser: 3.08 mg/dL — ABNORMAL HIGH (ref 0.50–1.10)
GFR calc Af Amer: 20 mL/min — ABNORMAL LOW (ref >90)
GFR calc Af Amer: 18 mL/min — ABNORMAL LOW (ref >90)
GFR calc non Af Amer: 17 mL/min — ABNORMAL LOW (ref >90)
GFR calc non Af Amer: 16 mL/min — ABNORMAL LOW (ref >90)
GLUCOSE: 114 mg/dL — AB (ref 70–99)
Glucose, Bld: 155 mg/dL — ABNORMAL HIGH (ref 70–99)
Potassium: 5 mEq/L (ref 3.7–5.3)
Potassium: 5.7 mEq/L — ABNORMAL HIGH (ref 3.7–5.3)
SODIUM: 129 meq/L — AB (ref 137–147)
Sodium: 127 mEq/L — ABNORMAL LOW (ref 137–147)

## 2013-12-04 LAB — CBC
HCT: 25.1 % — ABNORMAL LOW (ref 36.0–46.0)
HCT: 26.7 % — ABNORMAL LOW (ref 36.0–46.0)
HEMOGLOBIN: 8 g/dL — AB (ref 12.0–15.0)
Hemoglobin: 8.5 g/dL — ABNORMAL LOW (ref 12.0–15.0)
MCH: 32 pg (ref 26.0–34.0)
MCH: 30.8 pg (ref 26.0–34.0)
MCHC: 31.9 g/dL (ref 30.0–36.0)
MCHC: 31.8 g/dL (ref 30.0–36.0)
MCV: 100.4 fL — ABNORMAL HIGH (ref 78.0–100.0)
MCV: 96.7 fL (ref 78.0–100.0)
Platelets: 412 10*3/uL — ABNORMAL HIGH (ref 150–400)
Platelets: 434 10*3/uL — ABNORMAL HIGH (ref 150–400)
RBC: 2.5 MIL/uL — ABNORMAL LOW (ref 3.87–5.11)
RBC: 2.76 MIL/uL — ABNORMAL LOW (ref 3.87–5.11)
RDW: 15.6 % — ABNORMAL HIGH (ref 11.5–15.5)
RDW: 16.1 % — ABNORMAL HIGH (ref 11.5–15.5)
WBC: 14 10*3/uL — ABNORMAL HIGH (ref 4.0–10.5)
WBC: 16.9 10*3/uL — ABNORMAL HIGH (ref 4.0–10.5)

## 2013-12-04 LAB — BODY FLUID CELL COUNT WITH DIFFERENTIAL
Eos, Fluid: 0 %
Lymphs, Fluid: 12 %
Monocyte-Macrophage-Serous Fluid: 3 % — ABNORMAL LOW (ref 50–90)
Neutrophil Count, Fluid: 85 % — ABNORMAL HIGH (ref 0–25)
WBC FLUID: 1889 uL — AB (ref 0–1000)

## 2013-12-04 LAB — BLOOD GAS, ARTERIAL
Acid-base deficit: 1.2 mmol/L (ref 0.0–2.0)
Bicarbonate: 25.7 mEq/L — ABNORMAL HIGH (ref 20.0–24.0)
O2 Content: 6 L/min
O2 Saturation: 85.7 %
PATIENT TEMPERATURE: 98.6
TCO2: 27.7 mmol/L (ref 0–100)
pCO2 arterial: 66.1 mmHg (ref 35.0–45.0)
pH, Arterial: 7.214 — ABNORMAL LOW (ref 7.350–7.450)
pO2, Arterial: 65 mmHg — ABNORMAL LOW (ref 80.0–100.0)

## 2013-12-04 LAB — PROTEIN, BODY FLUID: TOTAL PROTEIN, FLUID: 6.8 g/dL

## 2013-12-04 LAB — MAGNESIUM: MAGNESIUM: 2.7 mg/dL — AB (ref 1.5–2.5)

## 2013-12-04 LAB — PREPARE RBC (CROSSMATCH)

## 2013-12-04 LAB — PROTIME-INR
INR: 1.06 (ref 0.00–1.49)
Prothrombin Time: 13.8 seconds (ref 11.6–15.2)

## 2013-12-04 LAB — SURGICAL PCR SCREEN
MRSA, PCR: NEGATIVE
Staphylococcus aureus: NEGATIVE

## 2013-12-04 LAB — APTT: aPTT: 28 seconds (ref 24–37)

## 2013-12-04 SURGERY — CREATION, PERICARDIAL WINDOW, SUBXIPHOID APPROACH
Anesthesia: General

## 2013-12-04 SURGERY — PERICARDIAL TAP
Anesthesia: LOCAL

## 2013-12-04 MED ORDER — SODIUM CHLORIDE 0.9 % IV SOLN
INTRAVENOUS | Status: DC | PRN
Start: 2013-12-04 — End: 2013-12-04
  Administered 2013-12-04: 13:00:00 via INTRAVENOUS

## 2013-12-04 MED ORDER — FENTANYL CITRATE 0.05 MG/ML IJ SOLN
INTRAMUSCULAR | Status: AC
  Filled 2013-12-04: qty 5

## 2013-12-04 MED ORDER — SENNOSIDES-DOCUSATE SODIUM 8.6-50 MG PO TABS
1.0000 | ORAL_TABLET | Freq: Every day | ORAL | Status: DC
  Filled 2013-12-04 (×6): qty 1

## 2013-12-04 MED ORDER — ARTIFICIAL TEARS OP OINT
TOPICAL_OINTMENT | OPHTHALMIC | Status: DC | PRN
  Administered 2013-12-04: 1 via OPHTHALMIC

## 2013-12-04 MED ORDER — OXYCODONE HCL 5 MG PO TABS: 5.0000 mg | ORAL_TABLET | Freq: Once | ORAL | Status: DC | PRN

## 2013-12-04 MED ORDER — BIOTENE DRY MOUTH MT LIQD
15.0000 mL | Freq: Two times a day (BID) | OROMUCOSAL | Status: DC
  Administered 2013-12-05 – 2013-12-09 (×9): 15 mL via OROMUCOSAL

## 2013-12-04 MED ORDER — DEXTROSE 5 % IV SOLN
1.5000 g | INTRAVENOUS | Status: AC
  Administered 2013-12-04: 1.5 g via INTRAVENOUS
  Filled 2013-12-04 (×2): qty 1.5

## 2013-12-04 MED ORDER — SODIUM CHLORIDE 0.9 % IV SOLN
INTRAVENOUS | Status: DC | PRN
  Administered 2013-12-04: 13:00:00 via INTRAVENOUS

## 2013-12-04 MED ORDER — ACETAMINOPHEN 500 MG PO TABS
1000.0000 mg | ORAL_TABLET | Freq: Four times a day (QID) | ORAL | Status: DC
  Administered 2013-12-05 – 2013-12-09 (×16): 1000 mg via ORAL
  Filled 2013-12-04 (×18): qty 2

## 2013-12-04 MED ORDER — KCL IN DEXTROSE-NACL 20-5-0.45 MEQ/L-%-% IV SOLN
INTRAVENOUS | Status: DC
  Administered 2013-12-04: 100 mL/h via INTRAVENOUS
  Administered 2013-12-05: 05:00:00 via INTRAVENOUS
  Filled 2013-12-04 (×3): qty 1000

## 2013-12-04 MED ORDER — PHENYLEPHRINE HCL 10 MG/ML IJ SOLN
10.0000 mg | INTRAVENOUS | Status: DC | PRN
  Administered 2013-12-04: 50 ug/min via INTRAVENOUS

## 2013-12-04 MED ORDER — ONDANSETRON HCL 4 MG/2ML IJ SOLN: 4.0000 mg | Freq: Four times a day (QID) | INTRAMUSCULAR | Status: DC | PRN

## 2013-12-04 MED ORDER — PROMETHAZINE HCL 25 MG/ML IJ SOLN: 6.2500 mg | INTRAMUSCULAR | Status: DC | PRN

## 2013-12-04 MED ORDER — ETOMIDATE 2 MG/ML IV SOLN
INTRAVENOUS | Status: DC | PRN
  Administered 2013-12-04: 14 mg via INTRAVENOUS

## 2013-12-04 MED ORDER — SODIUM CHLORIDE 0.9 % IV SOLN
INTRAVENOUS | Status: DC | PRN
  Administered 2013-12-04: 14:00:00 via INTRAVENOUS

## 2013-12-04 MED ORDER — POTASSIUM CHLORIDE 10 MEQ/50ML IV SOLN: 10.0000 meq | Freq: Every day | INTRAVENOUS | Status: DC | PRN

## 2013-12-04 MED ORDER — KCL IN DEXTROSE-NACL 20-5-0.45 MEQ/L-%-% IV SOLN
INTRAVENOUS | Status: AC
  Administered 2013-12-04: 1000 mL
  Filled 2013-12-04: qty 1000

## 2013-12-04 MED ORDER — HYDROMORPHONE HCL PF 1 MG/ML IJ SOLN
INTRAMUSCULAR | Status: AC
  Filled 2013-12-04: qty 1

## 2013-12-04 MED ORDER — MIDAZOLAM HCL 2 MG/2ML IJ SOLN
INTRAMUSCULAR | Status: AC
  Filled 2013-12-04: qty 2

## 2013-12-04 MED ORDER — 0.9 % SODIUM CHLORIDE (POUR BTL) OPTIME
TOPICAL | Status: DC | PRN
  Administered 2013-12-04: 1000 mL

## 2013-12-04 MED ORDER — FENTANYL CITRATE 0.05 MG/ML IJ SOLN
25.0000 ug | INTRAMUSCULAR | Status: DC | PRN
Start: 2013-12-04 — End: 2013-12-07
  Administered 2013-12-05 (×2): 25 ug via INTRAVENOUS
  Administered 2013-12-05: 50 ug via INTRAVENOUS
  Filled 2013-12-04 (×3): qty 2

## 2013-12-04 MED ORDER — DEXTROSE 5 % IV SOLN
1.5000 g | Freq: Two times a day (BID) | INTRAVENOUS | Status: DC
  Administered 2013-12-04: 1.5 g via INTRAVENOUS
  Filled 2013-12-04 (×2): qty 1.5

## 2013-12-04 MED ORDER — NEOSTIGMINE METHYLSULFATE 10 MG/10ML IV SOLN
INTRAVENOUS | Status: DC | PRN
  Administered 2013-12-04: 4 mg via INTRAVENOUS

## 2013-12-04 MED ORDER — BISACODYL 5 MG PO TBEC
10.0000 mg | DELAYED_RELEASE_TABLET | Freq: Every day | ORAL | Status: DC
  Administered 2013-12-06 – 2013-12-09 (×3): 10 mg via ORAL
  Filled 2013-12-04 (×4): qty 2

## 2013-12-04 MED ORDER — OXYCODONE HCL 5 MG/5ML PO SOLN: 5.0000 mg | Freq: Once | ORAL | Status: DC | PRN

## 2013-12-04 MED ORDER — ONDANSETRON HCL 4 MG/2ML IJ SOLN
INTRAMUSCULAR | Status: DC | PRN
  Administered 2013-12-04: 4 mg via INTRAVENOUS

## 2013-12-04 MED ORDER — CHLORHEXIDINE GLUCONATE 0.12 % MT SOLN
15.0000 mL | Freq: Two times a day (BID) | OROMUCOSAL | Status: DC
  Administered 2013-12-04 – 2013-12-06 (×3): 15 mL via OROMUCOSAL
  Filled 2013-12-04 (×4): qty 15

## 2013-12-04 MED ORDER — ROCURONIUM BROMIDE 100 MG/10ML IV SOLN
INTRAVENOUS | Status: DC | PRN
  Administered 2013-12-04: 35 mg via INTRAVENOUS

## 2013-12-04 MED ORDER — ONDANSETRON HCL 4 MG/2ML IJ SOLN
INTRAMUSCULAR | Status: AC
  Filled 2013-12-04: qty 2

## 2013-12-04 MED ORDER — FENTANYL CITRATE 0.05 MG/ML IJ SOLN
INTRAMUSCULAR | Status: DC | PRN
  Administered 2013-12-04 (×3): 50 ug via INTRAVENOUS

## 2013-12-04 MED ORDER — GLYCOPYRROLATE 0.2 MG/ML IJ SOLN
INTRAMUSCULAR | Status: DC | PRN
  Administered 2013-12-04: 0.6 mg via INTRAVENOUS

## 2013-12-04 MED ORDER — NEOSTIGMINE METHYLSULFATE 10 MG/10ML IV SOLN
INTRAVENOUS | Status: AC
  Filled 2013-12-04: qty 1

## 2013-12-04 MED ORDER — ACETAMINOPHEN 160 MG/5ML PO SOLN
1000.0000 mg | Freq: Four times a day (QID) | ORAL | Status: DC
  Filled 2013-12-04: qty 40

## 2013-12-04 MED ORDER — HYDROMORPHONE HCL PF 1 MG/ML IJ SOLN
0.2500 mg | INTRAMUSCULAR | Status: DC | PRN
  Administered 2013-12-04: 0.5 mg via INTRAVENOUS

## 2013-12-04 MED ORDER — OXYCODONE HCL 5 MG PO TABS
5.0000 mg | ORAL_TABLET | ORAL | Status: DC | PRN
  Administered 2013-12-06 (×2): 5 mg via ORAL
  Filled 2013-12-04 (×2): qty 1

## 2013-12-04 MED ORDER — GLYCOPYRROLATE 0.2 MG/ML IJ SOLN
INTRAMUSCULAR | Status: AC
  Filled 2013-12-04: qty 3

## 2013-12-04 MED ORDER — VITAMIN D (ERGOCALCIFEROL) 1.25 MG (50000 UNIT) PO CAPS: 50000.0000 [IU] | ORAL_CAPSULE | ORAL | Status: DC

## 2013-12-04 SURGICAL SUPPLY — 49 items
APL SKNCLS STERI-STRIP NONHPOA (GAUZE/BANDAGES/DRESSINGS) ×1
ATTRACTOMAT 16X20 MAGNETIC DRP (DRAPES) ×3 IMPLANT
BENZOIN TINCTURE PRP APPL 2/3 (GAUZE/BANDAGES/DRESSINGS) ×3 IMPLANT
CANISTER SUCTION 2500CC (MISCELLANEOUS) ×3 IMPLANT
CATH THORACIC 28FR (CATHETERS) IMPLANT
CATH THORACIC 28FR RT ANG (CATHETERS) IMPLANT
CATH THORACIC 36FR (CATHETERS) IMPLANT
CATH THORACIC 36FR RT ANG (CATHETERS) IMPLANT
CLOSURE WOUND 1/2 X4 (GAUZE/BANDAGES/DRESSINGS) ×1
CONT SPEC 4OZ CLIKSEAL STRL BL (MISCELLANEOUS) ×4 IMPLANT
COVER SURGICAL LIGHT HANDLE (MISCELLANEOUS) ×6 IMPLANT
DRAIN CHANNEL 28F RND 3/8 FF (WOUND CARE) ×3 IMPLANT
DRAPE LAPAROSCOPIC ABDOMINAL (DRAPES) ×3 IMPLANT
DRAPE PROXIMA HALF (DRAPES) ×3 IMPLANT
ELECT REM PT RETURN 9FT ADLT (ELECTROSURGICAL) ×3
ELECTRODE REM PT RTRN 9FT ADLT (ELECTROSURGICAL) ×1 IMPLANT
GLOVE BIO SURGEON STRL SZ7.5 (GLOVE) ×6 IMPLANT
HEMOSTAT POWDER SURGIFOAM 1G (HEMOSTASIS) IMPLANT
KIT BASIN OR (CUSTOM PROCEDURE TRAY) ×3 IMPLANT
KIT ROOM TURNOVER OR (KITS) ×3 IMPLANT
KIT SUCTION CATH 14FR (SUCTIONS) ×2 IMPLANT
NS IRRIG 1000ML POUR BTL (IV SOLUTION) ×3 IMPLANT
PACK CHEST (CUSTOM PROCEDURE TRAY) ×3 IMPLANT
PAD ARMBOARD 7.5X6 YLW CONV (MISCELLANEOUS) ×6 IMPLANT
PAD ELECT DEFIB RADIOL ZOLL (MISCELLANEOUS) ×3 IMPLANT
SPONGE GAUZE 4X4 12PLY (GAUZE/BANDAGES/DRESSINGS) IMPLANT
SPONGE GAUZE 4X4 12PLY STER LF (GAUZE/BANDAGES/DRESSINGS) ×2 IMPLANT
STRIP CLOSURE SKIN 1/2X4 (GAUZE/BANDAGES/DRESSINGS) ×2 IMPLANT
SUT SILK  1 MH (SUTURE) ×2
SUT SILK 1 MH (SUTURE) IMPLANT
SUT SILK 2 0 SH CR/8 (SUTURE) ×3 IMPLANT
SUT VIC AB 1 CTX 18 (SUTURE) ×5 IMPLANT
SUT VIC AB 1 CTX 36 (SUTURE) ×6
SUT VIC AB 1 CTX36XBRD ANBCTR (SUTURE) ×1 IMPLANT
SUT VIC AB 2-0 CT1 27 (SUTURE) ×3
SUT VIC AB 2-0 CT1 TAPERPNT 27 (SUTURE) IMPLANT
SUT VIC AB 3-0 X1 27 (SUTURE) ×5 IMPLANT
SWAB COLLECTION DEVICE MRSA (MISCELLANEOUS) IMPLANT
SYR 50ML SLIP (SYRINGE) IMPLANT
SYRINGE 10CC LL (SYRINGE) IMPLANT
SYSTEM SAHARA CHEST DRAIN ATS (WOUND CARE) IMPLANT
TAPE CLOTH SURG 4X10 WHT LF (GAUZE/BANDAGES/DRESSINGS) ×2 IMPLANT
TOWEL OR 17X24 6PK STRL BLUE (TOWEL DISPOSABLE) ×3 IMPLANT
TOWEL OR 17X26 10 PK STRL BLUE (TOWEL DISPOSABLE) ×9 IMPLANT
TRAP SPECIMEN MUCOUS 40CC (MISCELLANEOUS) IMPLANT
TRAY FOLEY CATH 14FRSI W/METER (CATHETERS) ×3 IMPLANT
TRAY FOLEY IC TEMP SENS 14FR (CATHETERS) ×3 IMPLANT
TUBE ANAEROBIC SPECIMEN COL (MISCELLANEOUS) IMPLANT
WATER STERILE IRR 1000ML POUR (IV SOLUTION) ×6 IMPLANT

## 2013-12-04 NOTE — Progress Notes (Signed)
NUTRITION FOLLOW UP  Intervention:    Continue Ensure Pudding PO TID to ensure adequate oral intake, each supplement provides 170 kcal and 4 grams of protein  Consider liberalizing diet to 2 gm sodium, without renal restriction since potassium and phosphorous are WNL.  Nutrition Dx:   Increased nutrient needs (protein/kcal) related to increased demand for nutrients as evidenced by underweight status. Ongoing.  Goal:   Intake to meet >90% of estimated nutrition needs. Met.  Monitor:   PO intake, labs, weight trend.  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  S/P subxyphoid pericardial window for pericardial effusion today. Currently in the OR. Transferring to ICU after procedure today. Patient was receiving a renal diet, consuming 75-100% of meals. Also taking Ensure Pudding BID to TID.  Potassium WNL, last Phos WNL. Not receiving HD.  Height: Ht Readings from Last 1 Encounters:  12/01/13 5' 1"  (1.549 m)    Weight Status:   Wt Readings from Last 1 Encounters:  12/04/13 100 lb 4.8 oz (45.496 kg)  11/29/13  98 lb 3.2 oz (44.543 kg)   Re-estimated needs:  Kcal: 1400-1600 Protein: 55-70 gm Fluid: 1.5 L  Skin: chest incision  Diet Order: NPO; previously Renal diet   Intake/Output Summary (Last 24 hours) at 12/04/13 1450 Last data filed at 12/04/13 1431  Gross per 24 hour  Intake    635 ml  Output    590 ml  Net     45 ml    Last BM: 6/28   Labs:   Recent Labs Lab 11/28/13 1112  11/29/13 0310  12/02/13 0411 12/03/13 0550 12/04/13 0300  NA 137  < >  --   < > 135* 130* 129*  K 4.2  < >  --   < > 4.3 4.5 5.0  CL 95*  < >  --   < > 91* 87* 86*  CO2 24  < >  --   < > 29  30 24   BUN 51*  < >  --   < > 55* 58* 69*  CREATININE 2.56*  < >  --   < > 2.10* 2.37* 2.85*  CALCIUM 7.9*  < >  --   < > 8.0* 8.4 8.8  MG 1.1*  < >  --   < > 1.5 2.3 2.7*  PHOS 2.2*  --  3.0  --   --   --   --   GLUCOSE 114*  < >  --   < > 95 134* 114*  < > = values in this interval not displayed.  CBG (last 3)  No results found for this basename: GLUCAP,  in the last 72 hours  Scheduled Meds: . [MAR HOLD] amLODipine  5 mg Oral Daily  . [facility] HOLD] aspirin EC  81 mg Oral QPM  . [MAR HOLD] calcium carbonate  6 tablet Oral TID  . [facility] HOLD] colchicine  0.3 mg Oral Daily  . [MAR HOLD] vitamin B-12  500 mcg Oral QPM  . [MAR HOLD] feeding supplement (ENSURE)  1 Container Oral TID BM  . [MAR HOLD] FLUoxetine  10 mg Oral Daily  . [facility] HOLD] folic acid  1 mg Oral Daily  . [facility] HOLD] gabapentin  600 mg  Oral TID  . [MAR HOLD] heparin  5,000 Units Subcutaneous 3 times per day  . [facility] HOLD] lidocaine  1 patch Transdermal Q24H  . [facility] HOLD] lipase/protease/amylase  1 capsule Oral TID AC  . [facility] HOLD] loratadine  10 mg Oral Daily  . [facility] HOLD] magnesium oxide  400 mg Oral TID PC & HS  . [MAR HOLD] multivitamin with minerals  1 tablet Oral Daily  . [MAR HOLD] pantoprazole  40 mg Oral Daily  . [MAR HOLD] predniSONE  20 mg Oral Q breakfast  . [MAR HOLD] thiamine  100 mg Oral Daily  . [MAR HOLD] Vitamin D (Ergocalciferol)  50,000 Units Oral Q7 days    Continuous Infusions:  none   Molli Barrows, RD, LDN, [place] Pager [phone] After Hours Pager [phone]'

## 2013-12-04 NOTE — Progress Notes (Signed)
Subjective: Patient notes she had some N/V last night. Night team gave her Phenergan 12.5mg  once, which resolved the problem. She states the vomitus was "white foam" and did not contain blood or bile. She reports mild CP and SOB.   Objective: Vital signs in last 24 hours: Filed Vitals:   12/02/13 2015 12/03/13 0459 12/03/13 2100 12/04/13 0500  BP: 145/99 92/61 103/89 78/46  Pulse: 122 120 121 106  Temp: 98.1 F (36.7 C) 98 F (36.7 C) 98.1 F (36.7 C) 98.1 F (36.7 C)  TempSrc: Oral Oral Oral Oral  Resp: 18 18 18 18   Height:      Weight:  44.2 kg (97 lb 7.1 oz)  45.496 kg (100 lb 4.8 oz)  SpO2: 93% 92% 91% 92%   Weight change: 1.296 kg (2 lb 13.7 oz)  Intake/Output Summary (Last 24 hours) at 12/04/13 0701 Last data filed at 12/03/13 1818  Gross per 24 hour  Intake    240 ml  Output    550 ml  Net   -310 ml   Physical Exam General: Alert, cooperative, sitting comfortably in bed HEENT: N/C, A/T, sclera anicteric, mucus membranes moist Neck: JVD to jawline present bilaterally, no lymphadenopathy CV: tachycardic, normal S1/S2, no friction rub heard Pulm: CTA bilaterally, respirations nonlabored GI: BS+, nontender, nondistended Ext: moves all, warm, no edema  Lab Results: CBC    Component Value Date/Time   WBC 14.0* 12/04/2013 0300   WBC 7.2 10/26/2011 1336   RBC 2.50* 12/04/2013 0300   RBC 2.49* 08/09/2012 1040   RBC 2.98* 10/26/2011 1336   HGB 8.0* 12/04/2013 0300   HGB 9.7* 10/26/2011 1336   HCT 25.1* 12/04/2013 0300   HCT 28.8* 10/26/2011 1336   PLT 412* 12/04/2013 0300   PLT 125* 10/26/2011 1336   MCV 100.4* 12/04/2013 0300   MCV 96.6 10/26/2011 1336   MCH 32.0 12/04/2013 0300   MCH 32.6 10/26/2011 1336   MCHC 31.9 12/04/2013 0300   MCHC 33.7 10/26/2011 1336   RDW 15.6* 12/04/2013 0300   RDW 13.6 10/26/2011 1336   LYMPHSABS 1.4 11/29/2013 0720   LYMPHSABS 2.7 10/26/2011 1336   MONOABS 1.5* 11/29/2013 0720   MONOABS 0.8 10/26/2011 1336   EOSABS 0.0 11/29/2013 0720   EOSABS 0.1  10/26/2011 1336   BASOSABS 0.0 11/29/2013 0720   BASOSABS 0.0 10/26/2011 1336   BMET    Component Value Date/Time   NA 129* 12/04/2013 0300   K 5.0 12/04/2013 0300   CL 86* 12/04/2013 0300   CO2 24 12/04/2013 0300   GLUCOSE 114* 12/04/2013 0300   BUN 69* 12/04/2013 0300   CREATININE 2.85* 12/04/2013 0300   CREATININE 2.56* 11/28/2013 1112   CALCIUM 8.8 12/04/2013 0300   CALCIUM 5.6* 12/22/2010 1010   GFRNONAA 17* 12/04/2013 0300   GFRNONAA 50* 08/14/2013 0958   GFRAA 20* 12/04/2013 0300   GFRAA 58* 08/14/2013 0958   Mag 2.7   12/04/2013 Mag 2.3   12/03/2013  Micro Results: Recent Results (from the past 240 hour(s))  CULTURE, BLOOD (ROUTINE X 2)     Status: None   Collection Time    11/24/13  8:30 AM      Result Value Ref Range Status   Specimen Description BLOOD RIGHT ARM   Final   Special Requests BOTTLES DRAWN AEROBIC ONLY 1CC   Final   Culture  Setup Time     Final   Value: 11/24/2013 14:18     Performed at [facility]  Culture     Final   Value: NO GROWTH 5 DAYS     Performed at [facility]   Report Status 11/30/2013 FINAL   Final  CULTURE, BLOOD (ROUTINE X 2)     Status: None   Collection Time    11/24/13  8:34 AM      Result Value Ref Range Status   Specimen Description BLOOD LEFT HAND   Final   Special Requests BOTTLES DRAWN AEROBIC ONLY 3CC   Final   Culture  Setup Time     Final   Value: 11/24/2013 14:16     Performed at [facility]   Culture     Final   Value: NO GROWTH 5 DAYS     Performed at [facility]   Report Status 11/30/2013 FINAL   Final  MRSA PCR SCREENING     Status: None   Collection Time    11/24/13 11:28 AM      Result Value Ref Range Status   MRSA by PCR NEGATIVE  NEGATIVE Final   Comment:            The GeneXpert MRSA Assay (FDA     approved for NASAL specimens     only), is one component of a     comprehensive MRSA colonization     surveillance program. It is not     intended to diagnose MRSA     infection nor to guide  or     monitor treatment for     MRSA infections.   Studies/Results: Dg Chest 2 View  12/02/2013   CLINICAL DATA:  Shortness of breath  EXAM: CHEST  2 VIEW  COMPARISON:  11/28/2013  FINDINGS: Cardiac shadow is again enlarged. Bibasilar infiltrates are seen left greater than right with associated effusion. This is increased slightly in the interval from the prior exam particularly in the left lower lobe.  IMPRESSION: Slight increase in the degree of basilar infiltrates with associated effusions.   Electronically Signed   By: Inez Catalina M.D.   On: 12/02/2013 15:44   Echocardiogram 12/01/2013: Since the previous study of 11/19/13, the size of the pericardial effusion has increased. No echocardiographic signs of hemodynamic compromise.  Echocardiogram 12/02/2013: This was a limited study for the evaluation of the pericardial effusion. The degree of pericardial effusion is unchanged, and it is moderate to large circumferential. There are no signs of hemodynamic compromise. I would recommend to repeat echocardiogram in 2 days.  Echocardiogram 12/04/2013: Significant large circumferential pericardial effusion persists. There is some RV collapse. Assessment of mitral inflow does not show marked respiratory variation. The hepatic veins are dilated. In one image, there is question of a target in the IVC. Carefull review reveals that this is not the true IVC (but rather another fluid filled space). Overall, the effusion may be larger. Findings are concerning for hemodynamic compromise.   Medications: I have reviewed the patient's current medications. Scheduled Meds: . amLODipine  5 mg Oral Daily  . aspirin EC  81 mg Oral QPM  . calcium carbonate  6 tablet Oral TID  . colchicine  0.3 mg Oral Daily  . vitamin B-12  500 mcg Oral QPM  . feeding supplement (ENSURE)  1 Container Oral TID BM  . FLUoxetine  10 mg Oral Daily  . folic acid  1 mg Oral Daily  . furosemide  40 mg Oral Daily  . gabapentin  600 mg  Oral TID  . heparin  5,000  Units Subcutaneous 3 times per day  . lidocaine  1 patch Transdermal Q24H  . lipase/protease/amylase  1 capsule Oral TID AC  . loratadine  10 mg Oral Daily  . magnesium oxide  400 mg Oral TID PC & HS  . multivitamin with minerals  1 tablet Oral Daily  . pantoprazole  40 mg Oral Daily  . predniSONE  20 mg Oral Q breakfast  . thiamine  100 mg Oral Daily  . Vitamin D (Ergocalciferol)  50,000 Units Oral Q7 days   Continuous Infusions:  PRN Meds:.acetaminophen Assessment/Plan: Ms. Rudden is a 58yo woman with PMHx of HTN, CKD 2/2 to FSGS, grade 2 dCHF, hypomagnesemia, anemia of chronic disease, chronic thrombocytopenia, and depression who was admitted for acute pericarditis with pericardial effusion.   1. Acute pericarditis with pericardial effusion: Etiology for pericarditis most likely viral considering patient had acute bronchitis a few weeks ago. Concern for malignancy as possible etiology less likely since patient did not have lesions on CXR and has no hx of cancer (most common usually breast-normal mammo 2014) or lung- as above, neg CXR). Currently patient has mild chest pain, SOB, tachycardia in 110s-120s, JVD, friction rub, pulsus alternans on exam, which has worsened over the past few days. EKG shows low voltage. A repeat echo was done today (12/04/2013) that showed the pericardial effusion persisting and possibly larger than before. No tamponade present. However, there is concern for hemodynamic compromise since her Cr has increased (2.37-->2.85) as well as her BUN (58--->69).  - Cardiology is performing pericardiocentesis today. If there is difficulty with this procedure then pericardial window will be needed. CVTS on board. Cardiology also considering right heart cath either before or after pericardiocentesis. Will keep in touch with cardiology about plan. Will f/u pericardial fluid measures.  - Continue Colchicine 0.3mg  PO QD for 3 months (end date 02/28/14)  -  Continue Prednisone 20mg  QD for 2 weeks total (11/28/13-12/11/13) followed by tapered regimen   2. Acute on chronic dCHF: Patient has been receiving serial echos for the pericardial effusion, which exacerbated her CHF. Her CHF may be worsening due to the large effusion and there is concern for hemodynamic compromise. Patient has JVD, tachycardia at rest. Lasix was held this AM in the setting of worsening Cr/BUN.  - Hold Lasix until after pericardiocentesis is done. Will assess her hemodynamic status afterwards and proceed from there. - Continue daily weights and strict I&Os   3. Hypomagnesemia  - Mg 2.7 this AM, 2.3 yesterday  - Will order repeat Mg for tomorrow  - Continue Mag Oxide 400mg  TID  - Will d/c on Mag Oxide 400mg  BID   4. 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 Cr has been higher this admission, remaining elevated above 2.0. Cr trending back up, 2.85 today. BUN also elevated from 58 -->69.  - Hold Lasix for now until hemodynamic status can be assessed   5. Anemia of chronic disease  - Hbg 8.9 on admission. Baseline is 8-10.  - Hbg at 8.0 today, 7.7 yesterday.  - Associated with CKD  - Continue to monitor   6. HTN  - BP controlled, 130-140s/80-90s  - Continue amlodipine 5mg  PO QD   Diet: Renal with 1239ml fluid restriction  DVT Prophy: Heparin [place]   Dispo: Disposition is deferred at this time, awaiting improvement of current medical problems.    The patient does have a current PCP Otho Bellows, MD) and does need an [facility] hospital follow-up appointment after discharge.  The patient does not have transportation limitations that hinder transportation to clinic appointments.  .Services Needed at time of discharge: Y = Yes, Blank = No PT:   OT:   RN:   Equipment:   Other:     LOS: 6 days   Albin Felling, MD 12/04/2013, 7:01 AM

## 2013-12-04 NOTE — Progress Notes (Signed)
[name], female   DOB: 09-Apr-1956, 58 y.o.   MRN: [id number]  The patient is scheduled for possible pericardiocentesis during the day today. I will be discussing the case completely with Dr. Angelena Form. Decision-making here is difficult. The patient's creatinine is worsening. She still has some pleural fluid. I'm very hesitant to push diuresis. She has resting sinus tachycardia with elevated neck veins. I do feel that further complete cardiac assessment is needed. Before pericardiocentesis today, she will have another followup limited echo to reassess her effusion. I will personally review this. I will also discuss with the cath team whether we should first proceed with complete right heart cath before the final decision about possible pericardiocentesis. I will also be touching base with cardiothoracic surgery to ask them to review for the possible decision to go directly to pericardial window.  Daryel November, MD

## 2013-12-04 NOTE — Brief Op Note (Signed)
11/28/2013 - 12/04/2013  2:51 PM  PATIENT:  [name] [name]  58 y.o. female  PRE-OPERATIVE DIAGNOSIS:  PERICARDIAL EFFUSION  POST-OPERATIVE DIAGNOSIS:  PERICARDIAL EFFUSION  PROCEDURE:   SUBXYPHOID PERICARDIAL WINDOW   SURGEON:  Ivin Poot, MD  ASSISTANT:  Suzzanne Cloud, PA-C  ANESTHESIA:   general  SPECIMEN:  Source of Specimen:  Pericardial biopsies, epicardial peel, pericardial effusion  DISPOSITION OF SPECIMEN:  Pathology  DRAINS: 28 Fr CT  PATIENT CONDITION:  PACU - hemodynamically stable.

## 2013-12-04 NOTE — Consult Note (Signed)
Subjective:   Patient is a 58 y.o. female with known history of HTN, prior ETOH abuse, CKD Stage IV, and Hypomagnesemia.  The patient was admitted to the hospital back in February for metabolic acidosis and AKI on CKD.  She developed respiratory failure due to hypervolemia.  She was treated with aggressive IV diuresis and eventually discharged home.  She presented for follow up in the clinic at which time she was complaining of left pleuritic chest pain.  She rated the pain as 10/10 occuring constantly and with burping, coughing, and taking deep breaths.  She states that if she leans forward she achieves some relief.  Physical exam findings revealed tachycardia and JVD.  It was felt she likely had pericarditis and was subsequently admitted to the hospital on 11/28/2013 to rule out Cardiac tamponade.  Upon admission she was treated with steroids and colchicine.  Echocardiogram was obtained and showed a large Pericardial effusion without evidence of tamponade.  Cardiology consult was obtained who felt she should continue treatment with steroids and diuresis.  It was felt Etiology could be viral, bacterial, or autoimmune.  It was felt she should undergo repeat Echocardiogram in 3 days.  The patient remained medically stable during hospitalization.  Repeat Echocardiogram was done on 12/02/2013 and showed no change in the pericardial effusion.  The patient continued to have tachycardia and JVD.  It was felt that despite tamponade being present the patient should undergo Therapeutic Pericardiocentesis.  She was tentatively scheduled to undergo Pericardiocentesis today, however there is some concern regarding her kidney function and the need for cardiac catheterization.  TCTS consult was obtained for possible Pericardial Window.  Currently the patient has no new complaints.  She has some shortness of breath when she ambulates and some dull chest discomfort.  She is not on oxygen.  The patient did not eat breakfast, but has  been eating ice chips and drinking water.  I instructed the patient to not have anything by mouth.   Patient Active Problem List   Diagnosis Date Noted  . Acute renal insufficiency 12/04/2013  . Pericardial effusion 12/03/2013  . Chest pain, pleuritic 11/28/2013  . Hyperglycemia 11/28/2013  . Acute pericarditis 11/28/2013  . Fluid overload 11/28/2013  . Acute pericarditis, unspecified 11/28/2013  . Left shoulder pain 11/28/2013  . Hyperkalemia 11/25/2013  . Diastolic congestive heart failure 11/25/2013  . Bronchitis, acute 11/22/2013  . 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 Increased anion gap metabolic acidosis 16/12/3708  . Physical deconditioning 09/20/2013  . Anemia 08/27/2013  . Elevated liver enzymes 06/12/2013  . Metabolic acidosis, increased anion gap 06/12/2013  . Closed fracture of unspecified part of upper end of humerus 01/30/2013  . Healthcare maintenance 01/30/2013  . Seasonal allergies 01/30/2013  . Alcohol dependence 11/06/2012  . Hypomagnesemia 10/30/2012  . 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 Severe protein-calorie malnutrition 08/14/2012  . Hypoalbuminemia 08/14/2012  . CKD (chronic kidney disease) stage 3, GFR 30-59 ml/min 08/07/2012  . Weight loss 10/26/2011  . Seizure disorder 09/07/2011  . 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 Chronic pain disorder 06/28/2011  . PANCREATITIS 05/18/2009  . HYPERTRIGLYCERIDEMIA 11/03/2008  . VITAMIN D DEFICIENCY 03/02/2008  . GERD 03/02/2008  . Hypocalcemia 12/04/2007  . Failure to thrive in adult 08/19/2007  . HYPERTENSION 04/05/2007  . Normochromic normocytic anemia 09/12/2006  . DEPRESSION 09/12/2006  . ALCOHOL ABUSE 07/02/2006  . Hepatomegaly 07/02/2006   Past Medical History  Diagnosis Date  . Anemia, B12 deficiency   . History of acute  pancreatitis   . Right knee pain     No recent imaging on chart  . 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  Dr. Mancel Bale, [facility]  . Hypertriglyceridemia   . GERD (gastroesophageal reflux disease)   . Vitamin D deficiency   . Subdural hematoma 02/2008    Likely 2/2 trauma from seizure from EtOH withdrawal, chronic in nature, sees Dr. Jerene Bears. Most recent CT head 10/2009 showing stable but persistent hematoma without mass effect.  . History of seizure disorder     Likely 2/2 alcohol abuse  . Hypocalcemia   . Hypomagnesemia   . Failure to thrive in childhood     Unclear etiology  . HTN (hypertension)   . Thrombocytopenia   . Hepatomegaly     On exam  . Joint pain   . Alcohol abuse   . Vitamin D deficiency   . Pancreatitis   . Insomnia   . Hyperlipidemia   . Pernicious anemia   . Macrocytic anemia   . 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 Depression   . Fx humeral neck 04/17/2011    Transverse fracture- minimally displaced- managed as outpatient   . ABNORMAL PAP SMEAR, LGSIL 07/23/2008    Annotation: HPV positive CIN I Dr. Mancel Bale, [facility] Qualifier: Diagnosis of  By: Oretha Ellis    . Pneumonia 05/20/2012  . Arthritis     "shoulders" (08/15/2013)  . 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  Marland Kitchen Chronic diastolic CHF (congestive heart failure)   . Hypomagnesemia     Past Surgical History  Procedure Laterality Date  . Cesarean section  1983  . Esophagogastroduodenoscopy  07/11/2011    Procedure: ESOPHAGOGASTRODUODENOSCOPY (EGD);  Surgeon: Beryle Beams, MD;  Location: [facility];  Service: Endoscopy;  Laterality: N/A;  . Colonoscopy  07/11/2011    Procedure: COLONOSCOPY;  Surgeon: Beryle Beams, MD;  Location: [facility];  Service: Endoscopy;  Laterality: N/A;  . Eye surgery Left     "trauma"  . Right colectomy  08/28/2011  . Esophagogastroduodenoscopy N/A 12/01/2012    Procedure: ESOPHAGOGASTRODUODENOSCOPY (EGD);  Surgeon: Irene Shipper, MD;  Location: [facility];  Service: Endoscopy;  Laterality:  N/A;  . Colonoscopy with esophagogastroduodenoscopy (egd) Left 08/21/2013    Procedure: COLONOSCOPY WITH ESOPHAGOGASTRODUODENOSCOPY (EGD);  Surgeon: Beryle Beams, MD;  Location: [facility]COPY;  Service: Endoscopy;  Laterality: Left;    Prescriptions prior to admission  Medication Sig Dispense Refill  . amLODipine (NORVASC) 5 MG tablet Take 1 tablet (5 mg total) by mouth daily.  30 tablet  3  . aspirin EC 81 MG tablet Take 1 tablet (81 mg total) by mouth every evening.  90 tablet  3  . calcium carbonate (TUMS) 500 MG chewable tablet Chew 6 tablets (1,200 mg of elemental calcium total) by mouth 3 (three) times daily. For bone health  90 tablet  3  . cetirizine (ZYRTEC) 10 MG tablet Take 1 tablet (10 mg total) by mouth daily.  90 tablet  3  . feeding supplement, ENSURE COMPLETE, (ENSURE COMPLETE) LIQD Take 237 mLs by mouth 3 (three) times daily between meals.  90 Bottle  1  . FLUoxetine (PROZAC) 10 MG capsule Take 1 capsule (10 mg total) by mouth daily. For depression  90 capsule  4  . folic acid (FOLVITE) 1 MG tablet Take  1 tablet (1 mg total) by mouth daily. For folic acid replacement  30 tablet  3  . furosemide (LASIX) 40 MG tablet Take 1 tablet (40 mg total) by mouth daily.  30 tablet  2  . gabapentin (NEURONTIN) 300 MG capsule Take 2 capsules (600 mg total) by mouth 3 (three) times daily. For anxiety/pain control  540 capsule  1  . lipase/protease/amylase (CREON-12/PANCREASE) 12000 UNITS CPEP capsule Take 1 capsule by mouth 3 (three) times daily before meals.  270 capsule  3  . Multiple Vitamin (MULTIVITAMIN WITH MINERALS) TABS tablet Take 1 tablet by mouth daily. For vitamin replacement  90 tablet  3  . omeprazole (PRILOSEC) 40 MG capsule Take 1 capsule (40 mg total) by mouth daily.  90 capsule  4  . predniSONE (DELTASONE) 20 MG tablet Take 1 tablet (20 mg total) by mouth daily with breakfast.  3 tablet  0  . sodium bicarbonate 650 MG tablet Take 2 tablets (1,300 mg total) by mouth 2 (two)  times daily.  60 tablet  0  . thiamine (VITAMIN B-1) 100 MG tablet Take 1 tablet (100 mg total) by mouth daily. For low thiamine  90 tablet  3  . vitamin B-12 (CYANOCOBALAMIN) 250 MCG tablet Take 1 tablet (250 mcg total) by mouth every evening.  90 tablet  3  . Vitamin D, Ergocalciferol, (DRISDOL) 50000 UNITS CAPS capsule Take 1 capsule (50,000 Units total) by mouth every 7 (seven) days. Thursdays: For bone health  4 capsule  3  . [DISCONTINUED] magnesium oxide (MAG-OX) 400 MG tablet Take 1 tablet (400 mg total) by mouth 4 (four) times daily.  360 tablet  6  . guaiFENesin-codeine 100-10 MG/5ML syrup Take 5 mLs by mouth every 4 (four) hours as needed for cough.  120 mL  0  . predniSONE (DELTASONE) 10 MG tablet Take 1 tablet (10 mg total) by mouth daily with breakfast.  3 tablet  0   Allergies  Allergen Reactions  . Amitriptyline Hcl Swelling    In the face.  . Doxycycline Hyclate Itching    Feels like something crawling under her skin    History  Substance Use Topics  . Smoking status: Former Smoker -- 0.50 packs/day for 40 years    Types: Cigarettes    Quit date: 09/20/2010  . Smokeless tobacco: Never Used  . Alcohol Use: Yes     Comment: "sips"     Family History  Problem Relation Age of Onset  . Cancer Mother     Died from stomach cancer and "flesh eating rash  . Heart failure Father     Died in 51s from an MI  . Alcohol abuse Sister     Twin sister drinks a lot, as did both her parents and brothers  . Stroke Brother     Has 7 brothers, 1 with CVA  . Lupus Mother     Review of Systems Pertinent items are noted in HPI.  Objective:   Patient Vitals for the past 8 hrs:  BP Temp Temp src Pulse Resp SpO2 Weight  12/04/13 0500 78/46 mmHg 98.1 F (36.7 C) Oral 106 18 92 % 100 lb 4.8 oz (45.496 kg)   BP 78/46  Pulse 106  Temp(Src) 98.1 F (36.7 C) (Oral)  Resp 18  Ht 5\' 1"  (1.549 m)  Wt 100 lb 4.8 oz (45.496 kg)  BMI 18.96 kg/m2  SpO2 92% General appearance: alert,  cooperative and no distress Head: Normocephalic, without obvious abnormality, atraumatic  Eyes: conjunctivae/corneas clear. PERRL, EOM's intact. Fundi benign. Neck: JVD  Lungs: clear to auscultation bilaterally Heart: regular rate and rhythm and tachy Abdomen: soft, non-tender; bowel sounds normal; no masses,  no organomegaly Extremities: extremities normal, atraumatic, no cyanosis or edema Neurologic: Grossly normal  Data Review:  CBC:  Lab Results  Component Value Date   WBC 14.0* 12/04/2013   WBC 7.2 10/26/2011   RBC 2.50* 12/04/2013   RBC 2.49* 08/09/2012   RBC 2.98* 10/26/2011   BMP:  Lab Results  Component Value Date   GLUCOSE 114* 12/04/2013   CO2 24 12/04/2013   BUN 69* 12/04/2013   CREATININE 2.85* 12/04/2013   CREATININE 2.56* 11/28/2013   CALCIUM 8.8 12/04/2013   CALCIUM 5.6* 12/22/2010    A/P:  1. Pericardial Effusion- Large per Echo, no evidence of tamponade- Pericardiocentesis todayWindow 2. Patient to remain NPO 3 Pericardial window recommended as best way to prevent recurrent pericardial effusion  I have discussed subxyphoid pericardial window with patient who understands benefits and risks ofnthe operation

## 2013-12-04 NOTE — Anesthesia Postprocedure Evaluation (Signed)
Anesthesia Post Note  Patient: [name] [name]  Procedure(s) Performed: Procedure(s) (LRB): SUBXYPHOID PERICARDIAL WINDOW WITH TEE (N/A) INTRAOPERATIVE TRANSESOPHAGEAL ECHOCARDIOGRAM (N/A)  Anesthesia type: general  Patient location: PACU  Post pain: Pain level controlled  Post assessment: Patient's Cardiovascular Status Stable  Last Vitals:  Filed Vitals:   12/04/13 1615  BP:   Pulse: 126  Temp:   Resp: 19    Post vital signs: Reviewed and stable  Level of consciousness: sedated  Complications: No apparent anesthesia complications

## 2013-12-04 NOTE — Progress Notes (Signed)
  Echocardiogram 2D Echocardiogram has been performed.  [name] [name] 12/04/2013, 8:26 AM

## 2013-12-04 NOTE — Anesthesia Procedure Notes (Signed)
Procedures The patient was identified and consent obtained.  TO was performed, and full barrier precautions were used.  The skin was anesthetized with lidocaine.  Once the vein was located with the 22 ga., the wire was inserted into the vein. The insertion site was dilated and the CVL was carefully inserted and sutured in place. The patient tolerated the procedure well.  CXR was ordered for PACU.Ultrasound guidance: relevant anatomy identified, needle position confirmed, vessel patent, wire seen inside the vessel.  Images printed for the medical record.  Start: 1311 End: 1321 J. Tedra Senegal, MD

## 2013-12-04 NOTE — Transfer of Care (Signed)
Immediate Anesthesia Transfer of Care Note  Patient: [name] [name]  Procedure(s) Performed: Procedure(s): SUBXYPHOID PERICARDIAL WINDOW WITH TEE (N/A) INTRAOPERATIVE TRANSESOPHAGEAL ECHOCARDIOGRAM (N/A)  Patient Location: PACU  Anesthesia Type:General  Level of Consciousness: awake, alert  and oriented  Airway & Oxygen Therapy: Patient Spontanous Breathing and Patient connected to face mask oxygen  Post-op Assessment: Report given to PACU RN, Post -op Vital signs reviewed and stable and Patient moving all extremities X 4  Post vital signs: Reviewed and stable  Complications: No apparent anesthesia complications

## 2013-12-04 NOTE — Progress Notes (Signed)
[name], female   DOB: 1956-04-27, 58 y.o.   MRN: [id number] I reviewed the case with Dr. Prescott Gum. He feels that it would be prudent to proceed with pericardial window. This will offer [place] any longer term solution if she has significant recurrent effusions. It will also help on a diagnostic bases. I am in agreement. I've discussed this with the patient and her family in person. Pericardial centesis in the cath lab has been canceled. The patient will have a pericardial window later today.  Daryel November, MD

## 2013-12-04 NOTE — Anesthesia Preprocedure Evaluation (Signed)
Anesthesia Evaluation    Reviewed: Allergy & Precautions, H&P , NPO status , Patient's Chart, lab work & pertinent test results  History of Anesthesia Complications Negative for: history of anesthetic complications  Airway       Dental   Pulmonary former smoker,          Cardiovascular hypertension, +CHF     Neuro/Psych PSYCHIATRIC DISORDERS Depression negative neurological ROS     GI/Hepatic GERD-  ,(+)     substance abuse  alcohol use,   Endo/Other  negative endocrine ROS  Renal/GU Renal InsufficiencyRenal disease     Musculoskeletal   Abdominal   Peds  Hematology   Anesthesia Other Findings   Reproductive/Obstetrics                           Anesthesia Physical Anesthesia Plan  ASA: III  Anesthesia Plan: General   Post-op Pain Management:    Induction: Intravenous  Airway Management Planned: Oral ETT  Additional Equipment: Arterial line and CVP  Intra-op Plan:   Post-operative Plan: Post-operative intubation/ventilation  Informed Consent:   Plan Discussed with: CRNA, Anesthesiologist and Surgeon  Anesthesia Plan Comments:         Anesthesia Quick Evaluation

## 2013-12-04 NOTE — Progress Notes (Signed)
[name], female   DOB: Apr 20, 1956, 58 y.o.   MRN: [id number]    SUBJECTIVE:  The patient is stable today. She continues to have persistent sinus tachycardia. I personally reviewed her limited followup echo today. Hepatic veins are dilated. IVC is dilated. LV function is good. There is a large circumferential pericardial effusion. The mitral inflow signal is not classic for hemodynamic compromise. There is some right ventricular collapse. The effusion cannot be quantitated exactly. However I am concerned that it may be getting larger.   Filed Vitals:   12/02/13 2015 12/03/13 0459 12/03/13 2100 12/04/13 0500  BP: 145/99 92/61 103/89 78/46  Pulse: 122 120 121 106  Temp: 98.1 F (36.7 C) 98 F (36.7 C) 98.1 F (36.7 C) 98.1 F (36.7 C)  TempSrc: Oral Oral Oral Oral  Resp: 18 18 18 18   Height:      Weight:  97 lb 7.1 oz (44.2 kg)  100 lb 4.8 oz (45.496 kg)  SpO2: 93% 92% 91% 92%     Intake/Output Summary (Last 24 hours) at 12/04/13 0920 Last data filed at 12/03/13 1818  Gross per 24 hour  Intake      0 ml  Output    550 ml  Net   -550 ml    LABS: Basic Metabolic Panel:  Recent Labs  12/03/13 0550 12/04/13 0300  NA 130* 129*  K 4.5 5.0  CL 87* 86*  CO2 30 24  GLUCOSE 134* 114*  BUN 58* 69*  CREATININE 2.37* 2.85*  CALCIUM 8.4 8.8  MG 2.3 2.7*   Liver Function Tests: No results found for this basename: AST, ALT, ALKPHOS, BILITOT, PROT, ALBUMIN,  in the last 72 hours No results found for this basename: LIPASE, AMYLASE,  in the last 72 hours CBC:  Recent Labs  12/03/13 0550 12/04/13 0300  WBC 13.9* 14.0*  HGB 7.7* 8.0*  HCT 24.3* 25.1*  MCV 100.4* 100.4*  PLT 369 412*   Cardiac Enzymes: No results found for this basename: CKTOTAL, CKMB, CKMBINDEX, TROPONINI,  in the last 72 hours BNP: No components found with this basename: POCBNP,  D-Dimer: No results found for this basename: DDIMER,  in the last 72 hours Hemoglobin A1C: No results  found for this basename: HGBA1C,  in the last 72 hours Fasting Lipid Panel: No results found for this basename: CHOL, HDL, LDLCALC, TRIG, CHOLHDL, LDLDIRECT,  in the last 72 hours Thyroid Function Tests:  Recent Labs  12/02/13 1250  TSH 0.897    RADIOLOGY: Dg Chest 2 View  12/02/2013   CLINICAL DATA:  Shortness of breath  EXAM: CHEST  2 VIEW  COMPARISON:  11/28/2013  FINDINGS: Cardiac shadow is again enlarged. Bibasilar infiltrates are seen left greater than right with associated effusion. This is increased slightly in the interval from the prior exam particularly in the left lower lobe.  IMPRESSION: Slight increase in the degree of basilar infiltrates with associated effusions.   Electronically Signed   By: Inez Catalina M.D.   On: 12/02/2013 15:44   Dg Chest 2 View  11/28/2013   CLINICAL DATA:  increased sob  EXAM: CHEST  2 VIEW  COMPARISON:  Prior radiograph from 11/26/2013  FINDINGS: Cardiomegaly is stable as compared to prior exam.  Lungs are mildly hypoinflated. There is persistent diffuse pulmonary edema, slightly improved from prior. Bilateral pleural effusions are also slightly improved. No definite new focal infiltrate. There is no pneumothorax.  Chronic fracture deformity of the proximal left humerus noted,  unchanged. No acute osseous abnormality.  IMPRESSION: Slight interval improvement in diffuse pulmonary edema with decreased size of bilateral pleural effusions, consistent with improving CHF.   Electronically Signed   By: Jeannine Boga M.D.   On: 11/28/2013 21:47   Dg Chest 2 View  11/22/2013   CLINICAL DATA:  Two week history of cough  EXAM: CHEST  2 VIEW  COMPARISON:  Prior chest x-ray 09/25/2013  FINDINGS: Stable cardiac and mediastinal contours. Interval resolution of right lower lobe infiltrate compared to 09/25/2013. No new airspace consolidation. No pulmonary edema, pleural effusion or pneumothorax. No suspicious pulmonary nodule or mass. Stable background bronchitic  changes. No acute osseous abnormality.  IMPRESSION: No active cardiopulmonary disease.  Interval resolution of right lower lobe infiltrate compared to 09/25/2013.   Electronically Signed   By: Jacqulynn Cadet M.D.   On: 11/22/2013 10:29   Dg Chest Port 1 View  11/26/2013   CLINICAL DATA:  Pulmonary  EXAM: PORTABLE CHEST - 1 VIEW  COMPARISON:  11/25/2013  FINDINGS: Cardiac enlargement. Pulmonary vascular congestion and bilateral edema is unchanged. Bilateral effusion and bibasilar atelectasis also unchanged.  IMPRESSION: Congestive heart failure with edema and effusions unchanged from the prior study.   Electronically Signed   By: Franchot Gallo M.D.   On: 11/26/2013 07:34   Dg Chest Port 1 View  11/25/2013   CLINICAL DATA:  Followup pulmonary edema  EXAM: PORTABLE CHEST - 1 VIEW  COMPARISON:  11/24/2013  FINDINGS: Bilateral effusions with perihilar and more confluent lung base opacity is stable from the prior exam. Cardiac silhouette is mildly enlarged. No pneumothorax.  IMPRESSION: No significant change from the previous day's study. Findings consistent with pulmonary edema with bilateral effusions.   Electronically Signed   By: Lajean Manes M.D.   On: 11/25/2013 07:19   Dg Chest Port 1 View  11/24/2013   CLINICAL DATA:  Chest pain, shortness of breath.  EXAM: PORTABLE CHEST - 1 VIEW  COMPARISON:  November 22, 2013.  FINDINGS: Mild cardiomegaly is noted. Bilateral perihilar and basilar opacities are noted most consistent with pulmonary edema with mild associated pleural effusions. No pneumothorax is noted. Bony thorax is intact.  IMPRESSION: Bilateral perihilar and basilar opacities are noted most consistent with pulmonary edema. Mild bilateral pleural effusions are noted.   Electronically Signed   By: Sabino Dick M.D.   On: 11/24/2013 08:29    PHYSICAL EXAM   patient is frail. She is oriented to person time and place. Affect is normal. She has resting sinus tachycardia. Lungs reveal scattered rhonchi.  Cardiac exam reveals S1 and S2. There is no significant peripheral edema.   ASSESSMENT AND PLAN:    Acute pericarditis, unspecified     Patient is receiving steroids. She could not receive nonsteroidal medications due to her renal function.    FSGS (focal segmental glomerulosclerosis)    Pericardial effusion     I have reviewed the overall situation extensively again. I have reviewed today's limited followup echo. She does not have every classic sign that we sometimes see by echo for hemodynamic compromise. However, I feel that the amount of fluid may still be increasing. There is a large circumferential pericardial effusion. Decision making is complex. However I feel we should definitely proceed with pericardiocentesis today. I will speak again with Dr. Angelena Form. I've also requested consultation from Dr. Prescott Gum of cardiothoracic surgery. I have decided that the first attempt today should be with pericardiocentesis. If there is difficulty with this, pericardial window  will definitely be needed. The patient will have to be followed after pericardiocentesis. Once her effusion has been tapped we'll be able to follow her renal function and make decisions about how to approach her overall volume status. She still has some pleural effusions. However we have not been able to diurese this for fear of making her renal function and hemodynamics worse. I will ask Dr. Angelena Form to consider right heart cath before and after the pericardiocentesis to better understand her hemodynamics.     Acute renal insufficiency     The patient's renal function is worse. After her pericardiocentesis we will have to make decisions about her overall volume status to determine how best to proceed.  Dola Argyle 12/04/2013 9:20 AM

## 2013-12-05 ENCOUNTER — Inpatient Hospital Stay (HOSPITAL_COMMUNITY): Payer: PRIVATE HEALTH INSURANCE

## 2013-12-05 DIAGNOSIS — N289 Disorder of kidney and ureter, unspecified: Secondary | ICD-10-CM

## 2013-12-05 LAB — BASIC METABOLIC PANEL
Anion gap: 14 (ref 5–15)
BUN: 65 mg/dL — AB (ref 6–23)
CALCIUM: 7.4 mg/dL — AB (ref 8.4–10.5)
CO2: 24 meq/L (ref 19–32)
CREATININE: 2.94 mg/dL — AB (ref 0.50–1.10)
Chloride: 96 mEq/L (ref 96–112)
GFR calc Af Amer: 19 mL/min — ABNORMAL LOW (ref >90)
GFR calc non Af Amer: 17 mL/min — ABNORMAL LOW (ref >90)
GLUCOSE: 215 mg/dL — AB (ref 70–99)
Potassium: 5.6 mEq/L — ABNORMAL HIGH (ref 3.7–5.3)
Sodium: 134 mEq/L — ABNORMAL LOW (ref 137–147)

## 2013-12-05 LAB — POCT I-STAT 3, ART BLOOD GAS (G3+)
ACID-BASE EXCESS: 1 mmol/L (ref 0.0–2.0)
Acid-Base Excess: 1 mmol/L (ref 0.0–2.0)
Bicarbonate: 27.6 meq/L — ABNORMAL HIGH (ref 20.0–24.0)
Bicarbonate: 26.4 mEq/L — ABNORMAL HIGH (ref 20.0–24.0)
O2 Saturation: 98 %
O2 Saturation: 95 %
PH ART: 7.361 (ref 7.350–7.450)
Patient temperature: 97.4
TCO2: 29 mmol/L (ref 0–100)
TCO2: 28 mmol/L (ref 0–100)
pCO2 arterial: 53.2 mmHg — ABNORMAL HIGH (ref 35.0–45.0)
pCO2 arterial: 46.8 mmHg — ABNORMAL HIGH (ref 35.0–45.0)
pH, Arterial: 7.32 — ABNORMAL LOW (ref 7.350–7.450)
pO2, Arterial: 110 mmHg — ABNORMAL HIGH (ref 80.0–100.0)
pO2, Arterial: 81 mmHg (ref 80.0–100.0)

## 2013-12-05 LAB — CBC
HCT: 26 % — ABNORMAL LOW (ref 36.0–46.0)
Hemoglobin: 8.4 g/dL — ABNORMAL LOW (ref 12.0–15.0)
MCH: 31.5 pg (ref 26.0–34.0)
MCHC: 32.3 g/dL (ref 30.0–36.0)
MCV: 97.4 fL (ref 78.0–100.0)
Platelets: 395 K/uL (ref 150–400)
RBC: 2.67 MIL/uL — ABNORMAL LOW (ref 3.87–5.11)
RDW: 16.8 % — ABNORMAL HIGH (ref 11.5–15.5)
WBC: 16.2 K/uL — ABNORMAL HIGH (ref 4.0–10.5)

## 2013-12-05 LAB — BASIC METABOLIC PANEL WITH GFR
Anion gap: 14 (ref 5–15)
BUN: 70 mg/dL — ABNORMAL HIGH (ref 6–23)
CO2: 24 meq/L (ref 19–32)
Calcium: 7.8 mg/dL — ABNORMAL LOW (ref 8.4–10.5)
Chloride: 90 meq/L — ABNORMAL LOW (ref 96–112)
Creatinine, Ser: 3.12 mg/dL — ABNORMAL HIGH (ref 0.50–1.10)
GFR calc Af Amer: 18 mL/min — ABNORMAL LOW
GFR calc non Af Amer: 16 mL/min — ABNORMAL LOW
Glucose, Bld: 130 mg/dL — ABNORMAL HIGH (ref 70–99)
Potassium: 6 meq/L — ABNORMAL HIGH (ref 3.7–5.3)
Sodium: 128 meq/L — ABNORMAL LOW (ref 137–147)

## 2013-12-05 MED ORDER — FUROSEMIDE 10 MG/ML IJ SOLN
40.0000 mg | Freq: Two times a day (BID) | INTRAMUSCULAR | Status: DC
  Administered 2013-12-05 – 2013-12-06 (×3): 40 mg via INTRAVENOUS
  Filled 2013-12-05 (×5): qty 4

## 2013-12-05 MED ORDER — SODIUM POLYSTYRENE SULFONATE 15 GM/60ML PO SUSP: 30.0000 g | Freq: Once | ORAL | Status: DC

## 2013-12-05 MED ORDER — SODIUM CHLORIDE 0.9 % IV SOLN
INTRAVENOUS | Status: DC
  Administered 2013-12-05: 10 mL/h via INTRAVENOUS

## 2013-12-05 MED ORDER — KCL IN DEXTROSE-NACL 20-5-0.45 MEQ/L-%-% IV SOLN
INTRAVENOUS | Status: DC
  Filled 2013-12-05: qty 1000

## 2013-12-05 MED ORDER — DEXTROSE 5 % IV SOLN
1.0000 g | INTRAVENOUS | Status: DC
  Administered 2013-12-05 – 2013-12-07 (×3): 1 g via INTRAVENOUS
  Filled 2013-12-05 (×5): qty 10

## 2013-12-05 MED ORDER — SODIUM POLYSTYRENE SULFONATE 15 GM/60ML PO SUSP
30.0000 g | Freq: Once | ORAL | Status: AC
  Administered 2013-12-05: 30 g via ORAL
  Filled 2013-12-05: qty 120

## 2013-12-05 NOTE — Progress Notes (Signed)
Subjective: Patient is POD1 s/p pericardial window. She states she feels well today. She complains of mild pain at site of chest tube. She is off bi-pap and breathing well. She denies CP, SOB, N/V. Concern for K and Cr rising.   Objective: Vital signs in last 24 hours: Filed Vitals:   12/05/13 0800 12/05/13 0811 12/05/13 0900 12/05/13 1000  BP: 113/88  108/72 122/77  Pulse:   38   Temp:  98.1 F (36.7 C)    TempSrc:  Oral    Resp: 14  14 19   Height:      Weight:      SpO2: 99%   97%   Weight change: 1.043 kg (2 lb 4.8 oz)  Intake/Output Summary (Last 24 hours) at 12/05/13 1032 Last data filed at 12/05/13 1000  Gross per 24 hour  Intake 3165.84 ml  Output   1222 ml  Net 1943.84 ml   Physical Exam General: Alert, cooperative, laying comfortably in bed  HEENT: N/C, A/T, sclera anicteric, mucus membranes moist  Neck: difficult to assess JVD due to dressing, no lymphadenopathy  CV: tachycardic in low 100s, normal S1/S2, no m/g/r Pulm: bilateral crackles and tubular breath sounds heard mostly on left side, respirations nonlabored  GI: BS+, nontender, nondistended  Ext: moves all, warm, no edema  Lab Results: CBC    Component Value Date/Time   WBC 16.2* 12/05/2013 0330   WBC 7.2 10/26/2011 1336   RBC 2.67* 12/05/2013 0330   RBC 2.49* 08/09/2012 1040   RBC 2.98* 10/26/2011 1336   HGB 8.4* 12/05/2013 0330   HGB 9.7* 10/26/2011 1336   HCT 26.0* 12/05/2013 0330   HCT 28.8* 10/26/2011 1336   PLT 395 12/05/2013 0330   PLT 125* 10/26/2011 1336   MCV 97.4 12/05/2013 0330   MCV 96.6 10/26/2011 1336   MCH 31.5 12/05/2013 0330   MCH 32.6 10/26/2011 1336   MCHC 32.3 12/05/2013 0330   MCHC 33.7 10/26/2011 1336   RDW 16.8* 12/05/2013 0330   RDW 13.6 10/26/2011 1336   LYMPHSABS 1.4 11/29/2013 0720   LYMPHSABS 2.7 10/26/2011 1336   MONOABS 1.5* 11/29/2013 0720   MONOABS 0.8 10/26/2011 1336   EOSABS 0.0 11/29/2013 0720   EOSABS 0.1 10/26/2011 1336   BASOSABS 0.0 11/29/2013 0720   BASOSABS 0.0 10/26/2011 1336     BMET    Component Value Date/Time   NA 128* 12/05/2013 0330   K 6.0* 12/05/2013 0330   CL 90* 12/05/2013 0330   CO2 24 12/05/2013 0330   GLUCOSE 130* 12/05/2013 0330   BUN 70* 12/05/2013 0330   CREATININE 3.12* 12/05/2013 0330   CREATININE 2.56* 11/28/2013 1112   CALCIUM 7.8* 12/05/2013 0330   CALCIUM 5.6* 12/22/2010 1010   GFRNONAA 16* 12/05/2013 0330   GFRNONAA 50* 08/14/2013 0958   GFRAA 18* 12/05/2013 0330   GFRAA 58* 08/14/2013 0958     Micro Results: Recent Results (from the past 240 hour(s))  SURGICAL PCR SCREEN     Status: None   Collection Time    12/04/13 12:13 PM      Result Value Ref Range Status   MRSA, PCR NEGATIVE  NEGATIVE Final   Staphylococcus aureus NEGATIVE  NEGATIVE Final   Comment:            The Xpert SA Assay (FDA     approved for NASAL specimens     in patients over 61 years of age),     is one component of  a comprehensive surveillance     program.  Test performance has     been validated by [facility] for patients greater     than or equal to 15 year old.     It is not intended     to diagnose infection nor to     guide or monitor treatment.  TISSUE CULTURE     Status: None   Collection Time    12/04/13 12:55 PM      Result Value Ref Range Status   Specimen Description TISSUE PERICARDIAL   Final   Special Requests PATIENT ON FOLLOWING ZINACEF   Final   Gram Stain     Final   Value: RARE WBC PRESENT, PREDOMINANTLY PMN     NO ORGANISMS SEEN     Performed at [facility]   Culture PENDING   Incomplete   Report Status PENDING   Incomplete  BODY FLUID CULTURE     Status: None   Collection Time    12/04/13  2:17 PM      Result Value Ref Range Status   Specimen Description FLUID PERICARDIAL   Final   Special Requests PATIENT ON FOLLOWING ZINACEF   Final   Gram Stain     Final   Value: WBC PRESENT,BOTH PMN AND MONONUCLEAR     NO ORGANISMS SEEN     Performed at [facility]   Culture PENDING   Incomplete   Report Status PENDING    Incomplete   Studies/Results: Dg Chest Port 1 View  12/05/2013   CLINICAL DATA:  Pericardial effusion  EXAM: PORTABLE CHEST - 1 VIEW  COMPARISON:  12/04/2013  FINDINGS: A pericardial drain is again noted in the midline in the inferior aspect of the chest. A right-sided jugular central venous line is noted in the mid superior vena cava. The cardiac shadow remains enlarged. Persistent bilateral effusions and infiltrate are seen. Vascular congestion is again noted. Changes consistent with prior bilateral humeral fractures are seen.  IMPRESSION: Stable appearance of the chest when compared with the prior exam.   Electronically Signed   By: Inez Catalina M.D.   On: 12/05/2013 07:31   Dg Chest Port 1 View  12/04/2013   CLINICAL DATA:  Postop thoracic surgery.  EXAM: PORTABLE CHEST - 1 VIEW  COMPARISON:  Same day.  FINDINGS: Endotracheal tube has been removed. Right internal jugular catheter is unchanged in position with tip in expected position of the SVC. Pericardial drain is unchanged. No pneumothorax is noted. Mild left pleural effusion is noted along the lateral wall of the left hemi thorax. Stable left basilar atelectasis. Mild central pulmonary vascular congestion and probable perihilar edema is noted.  IMPRESSION: Endotracheal tube has been removed. Stable mild left pleural effusion is noted along left lateral chest wall. Stable left basilar atelectasis. Mild central pulmonary vascular congestion with associated perihilar edema is again noted.   Electronically Signed   By: Sabino Dick M.D.   On: 12/04/2013 15:43   Dg Chest Portable 1 View  12/04/2013   CLINICAL DATA:  Subxiphoid pericardial window  EXAM: PORTABLE CHEST - 1 VIEW  COMPARISON:  12/02/2013  FINDINGS: Endotracheal tube in good position. Right jugular catheter tip in the SVC. No pneumothorax.  Interval development of pulmonary edema.  Mild to moderate left pleural effusion with some migration of pleural fluid into the apex. Left lower lobe  atelectasis is present. Small right effusion is present.  Large bore thoracostomy tube  in the pericardium.  IMPRESSION: Endotracheal tube in good position.  Pericardial drain in place.  Progression of pulmonary edema. Bilateral pleural effusions and left lower lobe atelectasis are present.   Electronically Signed   By: Franchot Gallo M.D.   On: 12/04/2013 15:16   Medications: I have reviewed the patient's current medications. Scheduled Meds: . acetaminophen  1,000 mg Oral 4 times per day   Or  . acetaminophen (TYLENOL) oral liquid 160 mg/5 mL  1,000 mg Oral 4 times per day  . amLODipine  5 mg Oral Daily  . antiseptic oral rinse  15 mL Mouth Rinse q12n4p  . aspirin EC  81 mg Oral QPM  . bisacodyl  10 mg Oral Daily  . calcium carbonate  6 tablet Oral TID  . cefTRIAXone (ROCEPHIN)  IV  1 g Intravenous Q24H  . chlorhexidine  15 mL Mouth Rinse BID  . colchicine  0.3 mg Oral Daily  . vitamin B-12  500 mcg Oral QPM  . feeding supplement (ENSURE)  1 Container Oral TID BM  . FLUoxetine  10 mg Oral Daily  . folic acid  1 mg Oral Daily  . furosemide  40 mg Intravenous BID  . gabapentin  600 mg Oral TID  . lidocaine  1 patch Transdermal Q24H  . lipase/protease/amylase  1 capsule Oral TID AC  . loratadine  10 mg Oral Daily  . magnesium oxide  400 mg Oral TID PC & HS  . multivitamin with minerals  1 tablet Oral Daily  . pantoprazole  40 mg Oral Daily  . predniSONE  20 mg Oral Q breakfast  . senna-docusate  1 tablet Oral QHS  . thiamine  100 mg Oral Daily  . Vitamin D (Ergocalciferol)  50,000 Units Oral Q7 days   Continuous Infusions: . sodium chloride 10 mL/hr (12/05/13 1014)   PRN Meds:.fentaNYL, ondansetron (ZOFRAN) IV, oxyCODONE Assessment/Plan: Ms. 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   1. Acute pericarditis with pericardial effusion s/p pericardial window 12/04/13: Etiology for pericarditis most likely inflammatory (pericardial fluid has high neutrophils, low lymphocytes and monocytes) vs. viral considering patient had acute bronchitis a few weeks ago. Concern for malignancy as possible etiology less likely since patient did not have lesions on CXR and has no hx of cancer (most common usually breast-normal mammo 2014) or lung- as above, neg CXR). Prior to surgery patient had mild chest pain, SOB, tachycardia in 110s-120s, JVD, friction rub, pulsus alternans on exam. EKG showed low voltage. A repeat echo was done on 12/04/2013 that showed the pericardial effusion persisting and possibly larger than before. No tamponade was present. Patient underwent pericardial window yesterday which was successful and drained 457ml of bloody pericardial fluid. Patient has chest tube in place that has drained ~130cc over the past 24 hours. Cell count from the pericardial fluid showed WBC 1889, Neutrophils 85, Lymphs 12, Monocytes 3.  - Cardiology is on board. Will f/u additional pericardial fluid measures.  - Continue Colchicine 0.3mg  PO QD for 3 months (end date 02/28/14)  - Continue Prednisone 20mg  QD for 2 weeks total (11/28/13-12/11/13) followed by tapered regimen  - Continue oxycodone PRN pain  2. Hyperkalemia 2/2 CKD: Patient's K has been creeping up: 5.0 --> 5.7 --> 6.0. Patient received dose of Kayexalate. EKG showed  no changes. D/c'd IV fluids with KCl and replaced with NS.  - F/u repeat BMP at 3PM - Continue Lasix 40mg  BID - Continue to monitor patient for signs of hyperkalemia (palpitations, weakness - Continue to monitor telemetry  3. Acute on chronic dCHF: Patient had been receiving serial echos for the pericardial effusion, which exacerbated her CHF. Patient is stable at this time after having pericardial window. However, she does have crackles and tubular breath  sounds on exam. CXR shows bilateral pleural effusions.   - Continue to monitor clinically - Continue Lasix 40mg  BID - Continue daily weights and strict I&Os   4. Hypomagnesemia: Stable  - Mg 2.7 yesterday - Continue Mag Oxide 400mg  TID  - Will d/c on Mag Oxide 400mg  BID   5. 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 Cr has been higher this admission, remaining elevated above 2.0. Cr trending back up, 3.12 today. BUN also elevated from 69 -->70.  - Continue IVF NS - Continue Lasix 40mg  BID  6. Anemia of chronic disease  - Hbg 8.9 on admission. Baseline is 8-10.  - Hbg at 8.4 today, 8.0 yesterday.  - Associated with CKD  - Continue to monitor   7. HTN  - BP controlled, 130-140s/80-90s  - Continue amlodipine 5mg  PO QD   Diet: Clear liquid diet DVT Prophy: SCDs Dispo: Disposition is deferred at this time, awaiting improvement of current medical problems.    Dispo: Disposition is deferred at this time, awaiting improvement of current medical problems.    The patient does have a current PCP Otho Bellows, MD) and does need an [facility] hospital follow-up appointment after discharge.  The patient does not have transportation limitations that hinder transportation to clinic appointments.  .Services Needed at time of discharge: Y = Yes, Blank = No PT:   OT:   RN:   Equipment:   Other:     LOS: 7 days   Albin Felling, MD 12/05/2013, 10:32 AM

## 2013-12-05 NOTE — Progress Notes (Signed)
T[facility] BRIEF SICU PROGRESS NOTE  1 Day Post-Op  S/P Procedure(s) (LRB): SUBXYPHOID PERICARDIAL WINDOW WITH TEE (N/A) INTRAOPERATIVE TRANSESOPHAGEAL ECHOCARDIOGRAM (N/A)   Stable day NSR w/ stable BP Minimal chest tube output  Plan: Leave pericardial tube for now.  Ohterwise plan per medical team.  Rexene Alberts 12/05/2013 3:38 PM

## 2013-12-05 NOTE — Op Note (Signed)
[name], [name] [name] NO.:  [id number]  MEDICAL RECORD NO.:  [id number]  LOCATION:  2S02C                        FACILITY:  [facility]  PHYSICIAN:  Ivin Poot, M.D.  DATE OF BIRTH:  April 16, 1956  DATE OF PROCEDURE:  12/04/2013 DATE OF DISCHARGE:                              OPERATIVE REPORT   PREOPERATIVE DIAGNOSIS:  Large pericardial effusion with pericarditis, chronic renal insufficiency.  POSTOPERATIVE DIAGNOSIS:  Large pericardial effusion with pericarditis, chronic renal insufficiency.  OPERATION:  Subxiphoid pericardial window.  SURGEON:  Ivin Poot, M.D.  ASSISTANT:  Suzzanne Cloud, PA-C  ANESTHESIA:  General.  INDICATIONS:  The patient is a 58 year old hypertensive female with chronic renal insufficiency who has been followed with a moderate to large pericardial effusion for the past week.  She has been symptomatic with chest pain and is felt to have pericarditis.  The fluid has been persistent, and the patient recently developed tachycardia with low blood pressure, and pericardiocentesis versus pericardial window was recommended.  I recommended pericardial window to improve the chances of obtaining diagnostic material as well as to improve the likelihood of preventing recurrence of pleural effusion in the future.  I discussed the procedures of subxiphoid pericardial window with the patient, and she demonstrated her understanding and location of the surgical incision, use of general anesthesia, and expected postoperative recovery.  She understood the risks of bleeding, and recurrent effusion. She agreed to proceed with surgery.  OPERATIVE PROCEDURE:  The patient was brought to the operating room and placed supine on the operating table where general anesthesia was induced.  The chest and abdomen were prepped and draped as a sterile field.  A small incision was made, centered over the xiphoid.  The xiphoid was removed.  The rectus fascia and  soft tissue were dissected. The sternal elevating retractor was placed.  The soft tissue was removed over the anterior surface of the pericardium.  The pericardium was incised.  It was thickened.  400 mL of bloody fluid was removed from the pericardial space.  A pericardial opening or window was created by excision of the anterior pericardium measuring 3-4 cm in diameter.  The surface or epicardium of the heart was examined and had an inflamed thick fibrinous exudate which was extremely shaggy and pronounced.  This was gently irrigated after draining the effusion and some of the material was removed and sent for culture as well as the tissue and fluid.  Tissue and fluid were also sent for cytology and pathology.  A right angled 28-French chest tube was placed dependently in the pericardial space and brought out through separate incision and secured to the skin.  Retractor was removed.  The surgical incision was closed in layers using interrupted Vicryl for the fascia running 2-0 Vicryl for the subcutaneous and a subcuticular for the skin.  Sterile dressings were applied.  The chest tube was connected to an underwater seal Pleur-evac drainage system.  The patient was extubated returned to recovery room.     Ivin Poot, M.D.     PV/MEDQ  D:  12/04/2013  T:  12/05/2013  Job:  [id number]

## 2013-12-05 NOTE — Progress Notes (Signed)
[name], female   DOB: 1955-09-17, 58 y.o.   MRN: [id number]       SUBJECTIVE:  Patient is status post subxiphoid pericardial window yesterday.  She feels good today, denies chest pain.  Not dyspneic.  K and creatinine rising.     Intake/Output Summary (Last 24 hours) at 12/05/13 0739 Last data filed at 12/05/13 0700  Gross per 24 hour  Intake 2998.34 ml  Output    905 ml  Net 2093.34 ml    Current Facility-Administered Medications  Medication Dose Route Frequency Provider Last Rate Last Dose  . acetaminophen (TYLENOL) tablet 1,000 mg  1,000 mg Oral 4 times per day Coolidge Breeze, PA-C   1,000 mg at 12/05/13 3235   Or  . acetaminophen (TYLENOL) solution 1,000 mg  1,000 mg Oral 4 times per day Coolidge Breeze, PA-C      . amLODipine (NORVASC) tablet 5 mg  5 mg Oral Daily Cresenciano Genre, MD   5 mg at 12/03/13 1008  . antiseptic oral rinse (BIOTENE) solution 15 mL  15 mL Mouth Rinse q12n4p Ivin Poot, MD      . aspirin EC tablet 81 mg  81 mg Oral QPM Cresenciano Genre, MD   81 mg at 12/03/13 1714  . bisacodyl (DULCOLAX) EC tablet 10 mg  10 mg Oral Daily Gina L Collins, PA-C      . calcium carbonate (TUMS - dosed in mg elemental calcium) chewable tablet 1,200 mg of elemental calcium  6 tablet Oral TID Cresenciano Genre, MD   1,200 mg of elemental calcium at 12/03/13 2109  . cefUROXime (ZINACEF) 1.5 g in dextrose 5 % 50 mL IVPB  1.5 g Intravenous Q12H Coolidge Breeze, PA-C   1.5 g at 12/04/13 2125  . chlorhexidine (PERIDEX) 0.12 % solution 15 mL  15 mL Mouth Rinse BID Ivin Poot, MD   15 mL at 12/04/13 2240  . colchicine tablet 0.3 mg  0.3 mg Oral Daily Larey Dresser, MD   0.3 mg at 12/03/13 1008  . cyanocobalamin tablet 500 mcg  500 mcg Oral QPM Axel Filler, MD   500 mcg at 12/03/13 1714  . dextrose 5 % and 0.45 % NaCl with KCl 20 mEq/L infusion   Intravenous Continuous Coolidge Breeze, PA-C 100 mL/hr at 12/05/13 0700    . feeding supplement (ENSURE) (ENSURE)  pudding 1 Container  1 Container Oral TID BM Hazle Coca, RD   1 Container at 12/02/13 1416  . fentaNYL (SUBLIMAZE) injection 25-50 mcg  25-50 mcg Intravenous Q2H PRN Coolidge Breeze, PA-C   50 mcg at 12/05/13 0520  . FLUoxetine (PROZAC) capsule 10 mg  10 mg Oral Daily Cresenciano Genre, MD   10 mg at 12/04/13 1106  . folic acid (FOLVITE) tablet 1 mg  1 mg Oral Daily Cresenciano Genre, MD   1 mg at 12/03/13 1008  . gabapentin (NEURONTIN) capsule 600 mg  600 mg Oral TID Cresenciano Genre, MD   600 mg at 12/04/13 1106  . lidocaine (LIDODERM) 5 % 1 patch  1 patch Transdermal Q24H Cresenciano Genre, MD   1 patch at 12/04/13 2240  . lipase/protease/amylase (CREON-12/PANCREASE) capsule 1 capsule  1 capsule Oral TID AC Cresenciano Genre, MD   1 capsule at 12/03/13 1558  . loratadine (CLARITIN) tablet 10 mg  10 mg Oral Daily Cresenciano Genre, MD   10 mg at 12/03/13  1008  . magnesium oxide (MAG-OX) tablet 400 mg  400 mg Oral TID PC & HS Cresenciano Genre, MD   400 mg at 12/04/13 0906  . multivitamin with minerals tablet 1 tablet  1 tablet Oral Daily Cresenciano Genre, MD   1 tablet at 12/03/13 1009  . ondansetron (ZOFRAN) injection 4 mg  4 mg Intravenous Q6H PRN Coolidge Breeze, PA-C      . oxyCODONE (Oxy IR/ROXICODONE) immediate release tablet 5-10 mg  5-10 mg Oral Q4H PRN Coolidge Breeze, PA-C      . pantoprazole (PROTONIX) EC tablet 40 mg  40 mg Oral Daily Cresenciano Genre, MD   40 mg at 12/04/13 1105  . potassium chloride 10 mEq in 50 mL *CENTRAL LINE* IVPB  10 mEq Intravenous Daily PRN Coolidge Breeze, PA-C      . predniSONE (DELTASONE) tablet 20 mg  20 mg Oral Q breakfast Cresenciano Genre, MD   20 mg at 12/04/13 7322  . senna-docusate (Senokot-S) tablet 1 tablet  1 tablet Oral QHS Gina L Collins, PA-C      . sodium polystyrene (KAYEXALATE) 15 GM/60ML suspension 30 g  30 g Oral Once Larey Dresser, MD      . thiamine (VITAMIN B-1) tablet 100 mg  100 mg Oral Daily Cresenciano Genre, MD   100 mg at 12/03/13 1009  . Vitamin D  (Ergocalciferol) (DRISDOL) capsule 50,000 Units  50,000 Units Oral Q7 days Cresenciano Genre, MD        Filed Vitals:   12/05/13 0500 12/05/13 0515 12/05/13 0600 12/05/13 0700  BP: 96/64  91/65   Pulse: 100  101 101  Temp:      TempSrc:      Resp: 13  13 10   Height:      Weight:  102 lb 9.6 oz (46.539 kg)    SpO2: 100%  100% 100%    PHYSICAL EXAM General: NAD  Neck: JVP difficult with dressing, no thyromegaly or thyroid nodule.  Lungs: Bibasilar crackles.  CV: Nondisplaced PMI. Tachycardic, HR low 100 bpm range, normal S1/S2, no S3, +S4, no murmur. No friction rub. No peripheral edema. No carotid bruit. Normal pedal pulses.  Abdomen: Soft, nontender, no hepatosplenomegaly, no distention.  Skin: Intact without lesions or rashes.  Neurologic: Alert and oriented x 3.  Psych: Normal affect.  Extremities: No clubbing or cyanosis.  HEENT: Normal.   TELEMETRY: Reviewed telemetry pt in sinus tachycardia  LABS: Basic Metabolic Panel:  Recent Labs  12/03/13 0550 12/04/13 0300 12/04/13 1830 12/05/13 0330  NA 130* 129* 127* 128*  K 4.5 5.0 5.7* 6.0*  CL 87* 86* 89* 90*  CO2 30 24 24 24   GLUCOSE 134* 114* 155* 130*  BUN 58* 69* 71* 70*  CREATININE 2.37* 2.85* 3.08* 3.12*  CALCIUM 8.4 8.8 7.7* 7.8*  MG 2.3 2.7*  --   --    Liver Function Tests: No results found for this basename: AST, ALT, ALKPHOS, BILITOT, PROT, ALBUMIN,  in the last 72 hours No results found for this basename: LIPASE, AMYLASE,  in the last 72 hours CBC:  Recent Labs  12/04/13 1830 12/05/13 0330  WBC 16.9* 16.2*  HGB 8.5* 8.4*  HCT 26.7* 26.0*  MCV 96.7 97.4  PLT 434* 395   Cardiac Enzymes: No results found for this basename: CKTOTAL, CKMB, CKMBINDEX, TROPONINI,  in the last 72 hours BNP: No components found with this basename: POCBNP,  D-Dimer: No results found for  this basename: DDIMER,  in the last 72 hours Hemoglobin A1C: No results found for this basename: HGBA1C,  in the last 72  hours Fasting Lipid Panel: No results found for this basename: CHOL, HDL, LDLCALC, TRIG, CHOLHDL, LDLDIRECT,  in the last 72 hours Thyroid Function Tests:  Recent Labs  12/02/13 1250  TSH 0.897   Anemia Panel: No results found for this basename: VITAMINB12, FOLATE, FERRITIN, TIBC, IRON, RETICCTPCT,  in the last 72 hours  ASSESSMENT AND PLAN: 1. Acute pericarditis: Status post subxiphoid pericardial window for large pericardial effusion.  No chest pain this morning, no friction rub.  She will remain on colchicine and steroids.  2. Acute on chronic diastolic heart failure: Avoid aggressive diuresis for now, creatinine rising.  3. AKI on CKD: Creatinine and K higher.  Will give Kayexalate this morning and get ECG to look for any changes (no ECG changes on telemetry).   Loralie Champagne 12/05/2013 7:39 AM

## 2013-12-06 ENCOUNTER — Inpatient Hospital Stay (HOSPITAL_COMMUNITY): Payer: PRIVATE HEALTH INSURANCE

## 2013-12-06 DIAGNOSIS — I5042 Chronic combined systolic (congestive) and diastolic (congestive) heart failure: Secondary | ICD-10-CM

## 2013-12-06 LAB — CBC
HEMATOCRIT: 24.5 % — AB (ref 36.0–46.0)
Hemoglobin: 7.7 g/dL — ABNORMAL LOW (ref 12.0–15.0)
MCH: 30.8 pg (ref 26.0–34.0)
MCHC: 31.4 g/dL (ref 30.0–36.0)
MCV: 98 fL (ref 78.0–100.0)
Platelets: 396 10*3/uL (ref 150–400)
RBC: 2.5 MIL/uL — ABNORMAL LOW (ref 3.87–5.11)
RDW: 16.6 % — AB (ref 11.5–15.5)
WBC: 12.8 10*3/uL — ABNORMAL HIGH (ref 4.0–10.5)

## 2013-12-06 LAB — COMPREHENSIVE METABOLIC PANEL
ALBUMIN: 2.1 g/dL — AB (ref 3.5–5.2)
ALT: 15 U/L (ref 0–35)
ANION GAP: 16 — AB (ref 5–15)
AST: 25 U/L (ref 0–37)
Alkaline Phosphatase: 241 U/L — ABNORMAL HIGH (ref 39–117)
BUN: 65 mg/dL — ABNORMAL HIGH (ref 6–23)
CO2: 26 mEq/L (ref 19–32)
Calcium: 7.1 mg/dL — ABNORMAL LOW (ref 8.4–10.5)
Chloride: 96 mEq/L (ref 96–112)
Creatinine, Ser: 3.07 mg/dL — ABNORMAL HIGH (ref 0.50–1.10)
GFR calc Af Amer: 18 mL/min — ABNORMAL LOW (ref >90)
GFR calc non Af Amer: 16 mL/min — ABNORMAL LOW (ref >90)
Glucose, Bld: 102 mg/dL — ABNORMAL HIGH (ref 70–99)
Potassium: 5.2 mEq/L (ref 3.7–5.3)
Sodium: 138 mEq/L (ref 137–147)
TOTAL PROTEIN: 7.1 g/dL (ref 6.0–8.3)
Total Bilirubin: <0.2 mg/dL — ABNORMAL LOW (ref 0.3–1.2)

## 2013-12-06 MED ORDER — FUROSEMIDE 10 MG/ML IJ SOLN: 40.0000 mg | Freq: Every day | INTRAMUSCULAR | Status: DC

## 2013-12-06 NOTE — Progress Notes (Addendum)
      [address]       [place] [address]             [phone]        CARDIOTHORACIC SURGERY PROGRESS NOTE   R2 Days Post-Op Procedure(s) (LRB): SUBXYPHOID PERICARDIAL WINDOW WITH TEE (N/A) INTRAOPERATIVE TRANSESOPHAGEAL ECHOCARDIOGRAM (N/A)  Subjective: Feels well.  Minimal soreness.  Objective: Vital signs: BP Readings from Last 1 Encounters:  12/06/13 134/85   Pulse Readings from Last 1 Encounters:  12/06/13 99   Resp Readings from Last 1 Encounters:  12/06/13 15   Temp Readings from Last 1 Encounters:  12/06/13 98.1 F (36.7 C) Oral    Hemodynamics:    Physical Exam:  Rhythm:   sinus  Breath sounds: clear  Heart sounds:  RRR  Incisions:  Clean and dry  Abdomen:  soft  Extremities:  Warm  Chest tube:  Trivial output   Intake/Output from previous day: 07/03 0701 - 07/04 0700 In: 425.2 [P.O.:100; I.V.:275.2; IV Piggyback:50] Out: 2706 [Urine:2655; Chest Tube:51] Intake/Output this shift: Total I/O In: 300 [P.O.:300] Out: 125 [Urine:125]  Lab Results:  CBC: Recent Labs  12/05/13 0330 12/06/13 0345  WBC 16.2* 12.8*  HGB 8.4* 7.7*  HCT 26.0* 24.5*  PLT 395 396    BMET:  Recent Labs  12/05/13 1500 12/06/13 0345  NA 134* 138  K 5.6* 5.2  CL 96 96  CO2 24 26  GLUCOSE 215* 102*  BUN 65* 65*  CREATININE 2.94* 3.07*  CALCIUM 7.4* 7.1*     CBG (last 3)  No results found for this basename: GLUCAP,  in the last 72 hours  ABG    Component Value Date/Time   PHART 7.361 12/05/2013 0516   PCO2ART 46.8* 12/05/2013 0516   PO2ART 81.0 12/05/2013 0516   HCO3 26.4* 12/05/2013 0516   TCO2 28 12/05/2013 0516   ACIDBASEDEF 1.2 12/04/2013 1545   O2SAT 95.0 12/05/2013 0516    CXR: PORTABLE CHEST - 1 VIEW  COMPARISON: One-view chest 12/05/2013  FINDINGS:  A right IJ line is stable in position. A left-sided chest tube is  stable. The heart is enlarged. Bilateral pleural effusions are  similar to the prior study. Bibasilar airspace  disease is noted.  Mild edema is unchanged. Posttraumatic changes of the humerus are  noted bilaterally.  IMPRESSION:  Stable bilateral pleural effusions and bibasilar airspace disease,  likely reflecting atelectasis.  Stable mild edema.  The support apparatus is stable.  Electronically Signed  By: Lawrence Santiago M.D.  On: 12/06/2013 07:25   Assessment/Plan: S/P Procedure(s) (LRB): SUBXYPHOID PERICARDIAL WINDOW WITH TEE (N/A) INTRAOPERATIVE TRANSESOPHAGEAL ECHOCARDIOGRAM (N/A)  Overall doing well following pericardial window Trivial chest tube output  Chronic anemia w/ expected post op acute blood loss anemia, slightly worse Expected post op atelectasis, mild   Will remove chest tube today  Watch anemia  Encourage mobility and pulm toilet  Continue antibiotics until OR cultures final  Looks ready for transfer to step-down telemetry or regular telemetry bed  Plan per medical teams   OWEN,CLARENCE H 12/06/2013 9:57 AM

## 2013-12-06 NOTE — Progress Notes (Signed)
[name], female   DOB: May 05, 1956, 58 y.o.   MRN: [id number]    Subjective:    SOB improving  Objective:   Temp:  [97.2 F (36.2 C)-98.1 F (36.7 C)] 98.1 F (36.7 C) (07/04 0734) Pulse Rate:  [38-104] 99 (07/04 0700) Resp:  [7-19] 15 (07/04 0700) BP: (100-149)/(66-92) 134/85 mmHg (07/04 0700) SpO2:  [94 %-100 %] 98 % (07/04 0700) Arterial Line BP: (121-136)/(68-74) 136/74 mmHg (07/03 1000) Weight:  [102 lb 1.2 oz (46.3 kg)] 102 lb 1.2 oz (46.3 kg) (07/04 0600) Last BM Date: 12/05/13  Filed Weights   12/04/13 0500 12/05/13 0515 12/06/13 0600  Weight: 100 lb 4.8 oz (45.496 kg) 102 lb 9.6 oz (46.539 kg) 102 lb 1.2 oz (46.3 kg)    Intake/Output Summary (Last 24 hours) at 12/06/13 6314 Last data filed at 12/06/13 0700  Gross per 24 hour  Intake 325.17 ml  Output   2618 ml  Net -2292.83 ml    Telemetry: NSR and sinus tach  Exam:  General: NAD  Resp: decreased breath sounds bilateral bases  Cardiac: RRR, no m/r/g  GI: abdomen soft, NT, ND  MSK: no LE edema  Neuro: no focal deficits  Psych: appropriate affect  Lab Results:  Basic Metabolic Panel:  Recent Labs Lab 12/02/13 0411 12/03/13 0550 12/04/13 0300  12/05/13 0330 12/05/13 1500 12/06/13 0345  NA 135* 130* 129*  < > 128* 134* 138  K 4.3 4.5 5.0  < > 6.0* 5.6* 5.2  CL 91* 87* 86*  < > 90* 96 96  CO2 29 30 24   < > 24 24 26   GLUCOSE 95 134* 114*  < > 130* 215* 102*  BUN 55* 58* 69*  < > 70* 65* 65*  CREATININE 2.10* 2.37* 2.85*  < > 3.12* 2.94* 3.07*  CALCIUM 8.0* 8.4 8.8  < > 7.8* 7.4* 7.1*  MG 1.5 2.3 2.7*  --   --   --   --   < > = values in this interval not displayed.  Liver Function Tests:  Recent Labs Lab 12/06/13 0345  AST 25  ALT 15  ALKPHOS 241*  BILITOT <0.2*  PROT 7.1  ALBUMIN 2.1*    CBC:  Recent Labs Lab 12/04/13 1830 12/05/13 0330 12/06/13 0345  WBC 16.9* 16.2* 12.8*  HGB 8.5* 8.4* 7.7*  HCT 26.7* 26.0* 24.5*  MCV 96.7 97.4 98.0  PLT 434*  395 396    Cardiac Enzymes: No results found for this basename: CKTOTAL, CKMB, CKMBINDEX, TROPONINI,  in the last 168 hours  BNP:  Recent Labs  11/28/13 2050  PROBNP 17837.0*    Coagulation:  Recent Labs Lab 12/04/13 1100  INR 1.06    ECG:   Medications:   Scheduled Medications: . acetaminophen  1,000 mg Oral 4 times per day   Or  . acetaminophen (TYLENOL) oral liquid 160 mg/5 mL  1,000 mg Oral 4 times per day  . amLODipine  5 mg Oral Daily  . antiseptic oral rinse  15 mL Mouth Rinse q12n4p  . aspirin EC  81 mg Oral QPM  . bisacodyl  10 mg Oral Daily  . calcium carbonate  6 tablet Oral TID  . cefTRIAXone (ROCEPHIN)  IV  1 g Intravenous Q24H  . chlorhexidine  15 mL Mouth Rinse BID  . colchicine  0.3 mg Oral Daily  . vitamin B-12  500 mcg Oral QPM  . feeding supplement (ENSURE)  1 Container Oral TID BM  .  FLUoxetine  10 mg Oral Daily  . folic acid  1 mg Oral Daily  . furosemide  40 mg Intravenous BID  . gabapentin  600 mg Oral TID  . lidocaine  1 patch Transdermal Q24H  . lipase/protease/amylase  1 capsule Oral TID AC  . loratadine  10 mg Oral Daily  . magnesium oxide  400 mg Oral TID PC & HS  . multivitamin with minerals  1 tablet Oral Daily  . pantoprazole  40 mg Oral Daily  . predniSONE  20 mg Oral Q breakfast  . senna-docusate  1 tablet Oral QHS  . thiamine  100 mg Oral Daily  . Vitamin D (Ergocalciferol)  50,000 Units Oral Q7 days     Infusions: . sodium chloride 10 mL/hr (12/05/13 1014)     PRN Medications:  fentaNYL, ondansetron (ZOFRAN) IV, oxyCODONE     Assessment/Plan    1. Acute pericarditis: Status post subxiphoid pericardial window for large pericardial effusion on 12/04/13. No chest pain this morning. She will remain on colchicine and steroids. Avoiding NSAIDs due to significant renal dysfunction - pericardial drain remains in place, followed by CT surgery - pericardial fluid: neg AFB, gram stain with rare WBCs and no organisms, cell  count with 1889 WBCS 85% PMNs, cultures negative thus far.   2. Acute on chronic diastolic heart failure - she has bilateral pleural effusions on most recent echo, this could be less likely to fluid overload and more related to pericardial/pleural inflammatory process in the setting of acute pericarditis.  - she is net negative 5.6 liters since admission, negative 2.2 liters yesterday on lasix 40mg  IV BID. Cr trending up. Will change to lasix 40mg  IV qday and follow labs, still with some findings of volume overload on exam. If Cr continues to trend up would likely hold diuretics tomorrow.   3. AKI on CKD - Cr historically over the last year fairly variable. Admission Cr approx 2.5, has been up and down but now trended up to 3.   - decrease lasix as described above, renally dosed medications including colochicine.   4. Hyperkalemia - improved from yesterday after kayexelate, continue to follow.       Carlyle Dolly, M.D., F.A.C.C.

## 2013-12-06 NOTE — Progress Notes (Signed)
Subjective: [name]. Pt doing very well this AM. Was up ambulating and tolerated breakfast w/o complaints. Pt in good spirits.   Objective: Vital signs in last 24 hours: Filed Vitals:   12/06/13 0300 12/06/13 0400 12/06/13 0500 12/06/13 0600  BP: 120/77 104/72 149/79 129/76  Pulse: 93 91 93 96  Temp:  97.3 F (36.3 C)    TempSrc:  Axillary    Resp: 14 8 18 13   Height:      Weight:    102 lb 1.2 oz (46.3 kg)  SpO2: 95% 97% 95% 94%   Weight change: -8.4 oz (-0.239 kg)  Intake/Output Summary (Last 24 hours) at 12/06/13 0703 Last data filed at 12/06/13 0600  Gross per 24 hour  Intake 425.17 ml  Output   2523 ml  Net -2097.83 ml   Physical Exam General: Alert, cooperative, laying comfortably in bed  HEENT: N/C, A/T, sclera anicteric, mucus membranes moist  Neck: difficult to assess JVD due to dressing, no lymphadenopathy  CV: tachycardic in low 100s, normal S1/S2, no m/g/r Pulm: bilateral crackles and tubular breath sounds heard mostly on left side, respirations nonlabored, minimal Chest tube output, subxyphoid dressing c/d/i  GI: BS+, nontender, nondistended  Ext: moves all, warm, no edema  Lab Results: Basic Metabolic Panel:  Recent Labs  12/04/13 0300  12/05/13 1500 12/06/13 0345  NA 129*  < > 134* 138  K 5.0  < > 5.6* 5.2  CL 86*  < > 96 96  CO2 24  < > 24 26  GLUCOSE 114*  < > 215* 102*  BUN 69*  < > 65* 65*  CREATININE 2.85*  < > 2.94* 3.07*  CALCIUM 8.8  < > 7.4* 7.1*  MG 2.7*  --   --   --   < > = values in this interval not displayed. Liver Function Tests:  Recent Labs  12/06/13 0345  AST 25  ALT 15  ALKPHOS 241*  BILITOT <0.2*  PROT 7.1  ALBUMIN 2.1*   CBC:  Recent Labs  12/05/13 0330 12/06/13 0345  WBC 16.2* 12.8*  HGB 8.4* 7.7*  HCT 26.0* 24.5*  MCV 97.4 98.0  PLT 395 396    Recent Labs  12/04/13 1100  LABPROT 13.8  INR 1.06   Urine Drug Screen: Drugs of Abuse     Component Value Date/Time   LABOPIA POSITIVE* 11/22/2013  1406   LABOPIA NEG 09/18/2011 0936   COCAINSCRNUR NONE DETECTED 11/22/2013 1406   COCAINSCRNUR NEG 09/18/2011 0936   LABBENZ NONE DETECTED 11/22/2013 1406   LABBENZ NEG 09/18/2011 0936   LABBENZ NEG 04/10/2011 1130   AMPHETMU NONE DETECTED 11/22/2013 1406   AMPHETMU NEG 04/10/2011 1130   THCU NONE DETECTED 11/22/2013 1406   LABBARB NONE DETECTED 11/22/2013 1406   LABBARB NEG 09/18/2011 0936     Micro Results: Recent Results (from the past 240 hour(s))  SURGICAL PCR SCREEN     Status: None   Collection Time    12/04/13 12:13 PM      Result Value Ref Range Status   MRSA, PCR NEGATIVE  NEGATIVE Final   Staphylococcus aureus NEGATIVE  NEGATIVE Final   Comment:            The Xpert SA Assay (FDA     approved for NASAL specimens     in patients over 19 years of age),     is one component of     a comprehensive surveillance     program.  Test performance has     been validated by [facility] for patients greater     than or equal to 48 year old.     It is not intended     to diagnose infection nor to     guide or monitor treatment.  TISSUE CULTURE     Status: None   Collection Time    12/04/13 12:55 PM      Result Value Ref Range Status   Specimen Description TISSUE PERICARDIAL   Final   Special Requests PATIENT ON FOLLOWING ZINACEF   Final   Gram Stain     Final   Value: RARE WBC PRESENT, PREDOMINANTLY PMN     NO ORGANISMS SEEN     Performed at [facility]   Culture PENDING   Incomplete   Report Status PENDING   Incomplete  AFB CULTURE WITH SMEAR     Status: None   Collection Time    12/04/13 12:55 PM      Result Value Ref Range Status   Specimen Description TISSUE PERICARDIAL   Final   Special Requests PATIENT ON FOLLOWING ZINACEF   Final   Acid Fast Smear     Final   Value: NO ACID FAST BACILLI SEEN     Performed at [facility]   Culture     Final   Value: CULTURE WILL BE EXAMINED FOR 6 WEEKS BEFORE ISSUING A FINAL REPORT     Performed at [facility]   Report Status PENDING   Incomplete  BODY FLUID CULTURE     Status: None   Collection Time    12/04/13  2:17 PM      Result Value Ref Range Status   Specimen Description FLUID PERICARDIAL   Final   Special Requests PATIENT ON FOLLOWING ZINACEF   Final   Gram Stain     Final   Value: WBC PRESENT,BOTH PMN AND MONONUCLEAR     NO ORGANISMS SEEN     Performed at [facility]   Culture     Final   Value: NO GROWTH     Performed at [facility]   Report Status PENDING   Incomplete   Studies/Results: Dg Chest Port 1 View  12/05/2013   CLINICAL DATA:  Pericardial effusion  EXAM: PORTABLE CHEST - 1 VIEW  COMPARISON:  12/04/2013  FINDINGS: A pericardial drain is again noted in the midline in the inferior aspect of the chest. A right-sided jugular central venous line is noted in the mid superior vena cava. The cardiac shadow remains enlarged. Persistent bilateral effusions and infiltrate are seen. Vascular congestion is again noted. Changes consistent with prior bilateral humeral fractures are seen.  IMPRESSION: Stable appearance of the chest when compared with the prior exam.   Electronically Signed   By: Inez Catalina M.D.   On: 12/05/2013 07:31   Dg Chest Port 1 View  12/04/2013   CLINICAL DATA:  Postop thoracic surgery.  EXAM: PORTABLE CHEST - 1 VIEW  COMPARISON:  Same day.  FINDINGS: Endotracheal tube has been removed. Right internal jugular catheter is unchanged in position with tip in expected position of the SVC. Pericardial drain is unchanged. No pneumothorax is noted. Mild left pleural effusion is noted along the lateral wall of the left hemi thorax. Stable left basilar atelectasis. Mild central pulmonary vascular congestion and probable perihilar edema is noted.  IMPRESSION: Endotracheal tube has been removed. Stable mild left  pleural effusion is noted along left lateral chest wall. Stable left basilar atelectasis. Mild central pulmonary vascular congestion with associated  perihilar edema is again noted.   Electronically Signed   By: Sabino Dick M.D.   On: 12/04/2013 15:43   Dg Chest Portable 1 View  12/04/2013   CLINICAL DATA:  Subxiphoid pericardial window  EXAM: PORTABLE CHEST - 1 VIEW  COMPARISON:  12/02/2013  FINDINGS: Endotracheal tube in good position. Right jugular catheter tip in the SVC. No pneumothorax.  Interval development of pulmonary edema.  Mild to moderate left pleural effusion with some migration of pleural fluid into the apex. Left lower lobe atelectasis is present. Small right effusion is present.  Large bore thoracostomy tube in the pericardium.  IMPRESSION: Endotracheal tube in good position.  Pericardial drain in place.  Progression of pulmonary edema. Bilateral pleural effusions and left lower lobe atelectasis are present.   Electronically Signed   By: Franchot Gallo M.D.   On: 12/04/2013 15:16   Medications: I have reviewed the patient's current medications. Scheduled Meds: . acetaminophen  1,000 mg Oral 4 times per day   Or  . acetaminophen (TYLENOL) oral liquid 160 mg/5 mL  1,000 mg Oral 4 times per day  . amLODipine  5 mg Oral Daily  . antiseptic oral rinse  15 mL Mouth Rinse q12n4p  . aspirin EC  81 mg Oral QPM  . bisacodyl  10 mg Oral Daily  . calcium carbonate  6 tablet Oral TID  . cefTRIAXone (ROCEPHIN)  IV  1 g Intravenous Q24H  . chlorhexidine  15 mL Mouth Rinse BID  . colchicine  0.3 mg Oral Daily  . vitamin B-12  500 mcg Oral QPM  . feeding supplement (ENSURE)  1 Container Oral TID BM  . FLUoxetine  10 mg Oral Daily  . folic acid  1 mg Oral Daily  . furosemide  40 mg Intravenous BID  . gabapentin  600 mg Oral TID  . lidocaine  1 patch Transdermal Q24H  . lipase/protease/amylase  1 capsule Oral TID AC  . loratadine  10 mg Oral Daily  . magnesium oxide  400 mg Oral TID PC & HS  . multivitamin with minerals  1 tablet Oral Daily  . pantoprazole  40 mg Oral Daily  . predniSONE  20 mg Oral Q breakfast  . senna-docusate  1  tablet Oral QHS  . thiamine  100 mg Oral Daily  . Vitamin D (Ergocalciferol)  50,000 Units Oral Q7 days   Continuous Infusions: . 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   1. Acute pericarditis with pericardial effusion s/p pericardial window 12/04/13: Etiology for pericarditis most likely inflammatory (pericardial fluid has high neutrophils, low lymphocytes and monocytes) vs. viral considering patient had acute bronchitis a few weeks ago. Concern for malignancy as possible etiology less likely since patient did not have lesions on CXR and has no hx of cancer (most common usually breast-normal mammo 2014) or lung- as above, neg CXR). Prior to surgery patient had mild chest pain, SOB, tachycardia in 110s-120s, JVD, friction rub, pulsus alternans on exam. EKG showed low voltage. A repeat echo was done on 12/04/2013 that showed the pericardial effusion  persisting and possibly larger than before. No tamponade was present. Patient underwent pericardial window yesterday which was successful and drained 475ml of bloody pericardial fluid. Patient has chest tube in place that has drained ~130cc over the past 24 hours. Cell count from the pericardial fluid showed WBC 1889, Neutrophils 85, Lymphs 12, Monocytes 3.  - Cardiology is on board. Will f/u additional pericardial fluid measures.  -CVTS following and will remove Chest tube today and transfer to stepdown/tele  - Continue Colchicine 0.3mg  PO QD for 3 months (end date 02/28/14)  - Continue Prednisone 20mg  QD for 2 weeks total (11/28/13-12/11/13) followed by tapered regimen  - Continue oxycodone PRN pain  2. Hyperkalemia  2/2 CKD: Patient's K has been creeping up: 5.0 --> 5.7 --> 6.0>>5.2. Patient received dose of Kayexalate on 7/3. EKG showed no changes. D/c'd IV fluids with KCl and replaced with NS.  - F/u repeat BMP at 3PM - pt did receive lasix today will hold in setting of worsening Cr fxn  - Continue to monitor telemetry  3. Acute on chronic dCHF: Patient had been receiving serial echos for the pericardial effusion, which exacerbated her CHF. Patient is stable at this time after having pericardial window. However, she does have crackles and tubular breath sounds on exam. CXR shows bilateral pleural effusions.   - Continue to monitor clinically - will hold Lasix 40mg  for tomorrow in setting of worsening Cr  - Continue daily weights and strict I&Os   4. Hypomagnesemia: Stable  - Mg 2.7 yesterday - Continue Mag Oxide 400mg  TID  - Will d/c on Mag Oxide 400mg  BID   5. 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 Cr has been higher this admission, remaining elevated above 2.0. Cr trending back up, 3.07 today. BUN also elevated from 69 -->70.  - Continue IVF NS - holding lasix   6. Anemia of chronic disease  - Hbg 8.9 on admission. Baseline is 8-10. Post-op Hgb 7.7 will continue to monitor but hold off on transfusion unless becomes symptomatic.  - Associated with CKD  - Continue to monitor   7. HTN  - BP controlled, 130-140s/80-90s  - Continue amlodipine 5mg  PO QD   Diet: Clear liquid diet DVT Prophy: SCDs Dispo: Disposition is deferred at this time, awaiting improvement of current medical problems.    Dispo: Disposition is deferred at this time, awaiting improvement of current medical problems.    The patient does have a current PCP [name] Bellows, MD) and does need an [facility] hospital follow-up appointment after discharge.  The patient does not have transportation limitations that hinder transportation to clinic appointments.  .Services Needed at time of discharge: Y = Yes, Blank =  No PT:   OT:   RN:   Equipment:   Other:     LOS: 8 days   Clinton Gallant, MD 12/06/2013, 7:03 AM

## 2013-12-07 ENCOUNTER — Inpatient Hospital Stay (HOSPITAL_COMMUNITY): Payer: PRIVATE HEALTH INSURANCE

## 2013-12-07 LAB — BODY FLUID CULTURE: CULTURE: NO GROWTH

## 2013-12-07 LAB — CBC WITH DIFFERENTIAL/PLATELET
Basophils Absolute: 0 10*3/uL (ref 0.0–0.1)
Basophils Relative: 0 % (ref 0–1)
Eosinophils Absolute: 0.2 10*3/uL (ref 0.0–0.7)
Eosinophils Relative: 2 % (ref 0–5)
HCT: 25.7 % — ABNORMAL LOW (ref 36.0–46.0)
Hemoglobin: 8.2 g/dL — ABNORMAL LOW (ref 12.0–15.0)
Lymphocytes Relative: 16 % (ref 12–46)
Lymphs Abs: 1.7 10*3/uL (ref 0.7–4.0)
MCH: 31.2 pg (ref 26.0–34.0)
MCHC: 31.9 g/dL (ref 30.0–36.0)
MCV: 97.7 fL (ref 78.0–100.0)
Monocytes Absolute: 1.3 10*3/uL — ABNORMAL HIGH (ref 0.1–1.0)
Monocytes Relative: 12 % (ref 3–12)
Neutro Abs: 7.7 10*3/uL (ref 1.7–7.7)
Neutrophils Relative %: 70 % (ref 43–77)
Platelets: 461 10*3/uL — ABNORMAL HIGH (ref 150–400)
RBC: 2.63 MIL/uL — ABNORMAL LOW (ref 3.87–5.11)
RDW: 16.2 % — ABNORMAL HIGH (ref 11.5–15.5)
WBC: 10.9 10*3/uL — ABNORMAL HIGH (ref 4.0–10.5)

## 2013-12-07 LAB — BASIC METABOLIC PANEL
Anion gap: 14 (ref 5–15)
BUN: 62 mg/dL — ABNORMAL HIGH (ref 6–23)
CO2: 26 mEq/L (ref 19–32)
Calcium: 6.8 mg/dL — ABNORMAL LOW (ref 8.4–10.5)
Chloride: 97 mEq/L (ref 96–112)
Creatinine, Ser: 2.83 mg/dL — ABNORMAL HIGH (ref 0.50–1.10)
GFR calc Af Amer: 20 mL/min — ABNORMAL LOW (ref >90)
GFR calc non Af Amer: 18 mL/min — ABNORMAL LOW (ref >90)
Glucose, Bld: 98 mg/dL (ref 70–99)
Potassium: 5.1 mEq/L (ref 3.7–5.3)
Sodium: 137 mEq/L (ref 137–147)

## 2013-12-07 MED ORDER — SODIUM CHLORIDE 0.9 % IJ SOLN
10.0000 mL | INTRAMUSCULAR | Status: DC | PRN
  Administered 2013-12-07: 20 mL

## 2013-12-07 NOTE — Progress Notes (Signed)
Subjective: [name] [name]. Pt had chest tube and pericardial drain removed on 9.9.35 w/o complication. Pt ambulating and had BM. Up eating breakfast and having no complaints.   Objective: Vital signs in last 24 hours: Filed Vitals:   12/07/13 0500 12/07/13 0600 12/07/13 0700 12/07/13 0807  BP: 142/84 128/77 135/82   Pulse: 96 93 94   Temp:    97.6 F (36.4 C)  TempSrc:    Oral  Resp: 12 17 8    Height:      Weight: 102 lb 3.2 oz (46.358 kg)     SpO2: 99% 97% 96%    Weight change: 2 oz (0.058 kg)  Intake/Output Summary (Last 24 hours) at 12/07/13 0810 Last data filed at 12/07/13 0600  Gross per 24 hour  Intake   1130 ml  Output   1755 ml  Net   -625 ml   Physical Exam General: Alert, cooperative, laying comfortably in bed  HEENT: N/C, A/T, sclera anicteric, mucus membranes moist  Neck: RIJ central line dressing c/d/i, no lymphadenopathy  CV: tachycardic in low 100s, normal S1/S2, no m/g/r Pulm: bilateral crackles, respirations nonlabored, subxyphoid dressing c/d/i  GI: BS+, nontender, nondistended  Ext: moves all, warm, no edema  Lab Results: Basic Metabolic Panel:  Recent Labs  12/06/13 0345 12/07/13 0500  NA 138 137  K 5.2 5.1  CL 96 97  CO2 26 26  GLUCOSE 102* 98  BUN 65* 62*  CREATININE 3.07* 2.83*  CALCIUM 7.1* 6.8*   Liver Function Tests:  Recent Labs  12/06/13 0345  AST 25  ALT 15  ALKPHOS 241*  BILITOT <0.2*  PROT 7.1  ALBUMIN 2.1*   CBC:  Recent Labs  12/06/13 0345 12/07/13 0500  WBC 12.8* 10.9*  NEUTROABS  --  7.7  HGB 7.7* 8.2*  HCT 24.5* 25.7*  MCV 98.0 97.7  PLT 396 461*    Recent Labs  12/04/13 1100  LABPROT 13.8  INR 1.06   Urine Drug Screen: Drugs of Abuse     Component Value Date/Time   LABOPIA POSITIVE* 11/22/2013 1406   LABOPIA NEG 09/18/2011 0936   COCAINSCRNUR NONE DETECTED 11/22/2013 1406   COCAINSCRNUR NEG 09/18/2011 0936   LABBENZ NONE DETECTED 11/22/2013 1406   LABBENZ NEG 09/18/2011 0936   LABBENZ NEG 04/10/2011  1130   AMPHETMU NONE DETECTED 11/22/2013 1406   AMPHETMU NEG 04/10/2011 1130   THCU NONE DETECTED 11/22/2013 1406   LABBARB NONE DETECTED 11/22/2013 1406   LABBARB NEG 09/18/2011 7017     Micro Results: Recent Results (from the past 240 hour(s))  SURGICAL PCR SCREEN     Status: None   Collection Time    12/04/13 12:13 PM      Result Value Ref Range Status   MRSA, PCR NEGATIVE  NEGATIVE Final   Staphylococcus aureus NEGATIVE  NEGATIVE Final   Comment:            The Xpert SA Assay (FDA     approved for NASAL specimens     in patients over 75 years of age),     is one component of     a comprehensive surveillance     program.  Test performance has     been validated by [employer] for patients greater     than or equal to 69 year old.     It is not intended     to diagnose infection nor to     guide or monitor  treatment.  TISSUE CULTURE     Status: None   Collection Time    12/04/13 12:55 PM      Result Value Ref Range Status   Specimen Description TISSUE PERICARDIAL   Final   Special Requests PATIENT ON FOLLOWING ZINACEF   Final   Gram Stain     Final   Value: RARE WBC PRESENT, PREDOMINANTLY PMN     NO ORGANISMS SEEN     Performed at [facility]   Culture     Final   Value: NO GROWTH 2 DAYS     Performed at [facility]   Report Status PENDING   Incomplete  AFB CULTURE WITH SMEAR     Status: None   Collection Time    12/04/13 12:55 PM      Result Value Ref Range Status   Specimen Description TISSUE PERICARDIAL   Final   Special Requests PATIENT ON FOLLOWING ZINACEF   Final   Acid Fast Smear     Final   Value: NO ACID FAST BACILLI SEEN     Performed at [facility]   Culture     Final   Value: CULTURE WILL BE EXAMINED FOR 6 WEEKS BEFORE ISSUING A FINAL REPORT     Performed at [facility]   Report Status PENDING   Incomplete  BODY FLUID CULTURE     Status: None   Collection Time    12/04/13  2:17 PM      Result Value Ref Range  Status   Specimen Description FLUID PERICARDIAL   Final   Special Requests PATIENT ON FOLLOWING ZINACEF   Final   Gram Stain     Final   Value: WBC PRESENT,BOTH PMN AND MONONUCLEAR     NO ORGANISMS SEEN     Performed at [facility]   Culture     Final   Value: NO GROWTH 2 DAYS     Performed at [facility]   Report Status PENDING   Incomplete   Studies/Results: Dg Chest 2 View  12/07/2013   CLINICAL DATA:  Atelectasis  EXAM: CHEST  2 VIEW  COMPARISON:  12/06/2013  FINDINGS: The cardiac shadow is stable. The pericardial drain is been removed in the interval. A right-sided jugular line is again seen in the mid superior vena cava. No pneumothorax is noted. Small pleural effusions are noted bilaterally but improved from the prior study. Bibasilar opacities are again seen slightly worse on the left than the right also improved from the prior study. No acute bony abnormality is noted.  IMPRESSION: Improving bibasilar changes.   Electronically Signed   By: Inez Catalina M.D.   On: 12/07/2013 07:08   Dg Chest Port 1 View  12/06/2013   CLINICAL DATA:  Status post surgery.  EXAM: PORTABLE CHEST - 1 VIEW  COMPARISON:  One-view chest 12/05/2013  FINDINGS: A right IJ line is stable in position. A left-sided chest tube is stable. The heart is enlarged. Bilateral pleural effusions are similar to the prior study. Bibasilar airspace disease is noted. Mild edema is unchanged. Posttraumatic changes of the humerus are noted bilaterally.  IMPRESSION: Stable bilateral pleural effusions and bibasilar airspace disease, likely reflecting atelectasis.  Stable mild edema.  The support apparatus is stable.   Electronically Signed   By: Lawrence Santiago M.D.   On: 12/06/2013 07:25   Medications: I have reviewed the patient's current medications. Scheduled Meds: . acetaminophen  1,000 mg Oral 4  times per day   Or  . acetaminophen (TYLENOL) oral liquid 160 mg/5 mL  1,000 mg Oral 4 times per day  . amLODipine  5  mg Oral Daily  . antiseptic oral rinse  15 mL Mouth Rinse q12n4p  . aspirin EC  81 mg Oral QPM  . bisacodyl  10 mg Oral Daily  . calcium carbonate  6 tablet Oral TID  . cefTRIAXone (ROCEPHIN)  IV  1 g Intravenous Q24H  . colchicine  0.3 mg Oral Daily  . vitamin B-12  500 mcg Oral QPM  . feeding supplement (ENSURE)  1 Container Oral TID BM  . FLUoxetine  10 mg Oral Daily  . folic acid  1 mg Oral Daily  . gabapentin  600 mg Oral TID  . lidocaine  1 patch Transdermal Q24H  . lipase/protease/amylase  1 capsule Oral TID AC  . loratadine  10 mg Oral Daily  . magnesium oxide  400 mg Oral TID PC & HS  . multivitamin with minerals  1 tablet Oral Daily  . pantoprazole  40 mg Oral Daily  . predniSONE  20 mg Oral Q breakfast  . senna-docusate  1 tablet Oral QHS  . thiamine  100 mg Oral Daily  . Vitamin D (Ergocalciferol)  50,000 Units Oral Q7 days   Continuous Infusions: . sodium chloride 10 mL/hr (12/05/13 1014)   PRN Meds:.fentaNYL, ondansetron (ZOFRAN) IV, oxyCODONE Assessment/Plan: Ms. 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   1. Acute pericarditis with pericardial effusion s/p pericardial window 12/04/13: Etiology for pericarditis most likely inflammatory (pericardial fluid has high neutrophils, low lymphocytes and monocytes) vs. viral considering patient had acute bronchitis a few weeks ago. Concern for malignancy as possible etiology less likely since patient did not have lesions on CXR and has no hx of cancer (most common usually breast-normal mammo 2014) or lung- as above, neg CXR). Prior to surgery patient had mild chest pain, SOB, tachycardia in 110s-120s, JVD, friction rub, pulsus alternans on exam. EKG showed low voltage. A repeat echo was done on 12/04/2013 that  showed the pericardial effusion persisting and possibly larger than before without tamponade. Window on 12/04/13  Cell count from the pericardial fluid showed WBC 1889, Neutrophils 85, Lymphs 12, Monocytes 3. Prelim AFB culture negative, Prelim tissue culture NGTD just PMNs, cytology still pending, serum ANA + (1:320) Anti-dsDNA neg, quanitferon negative, lights criteria suggest exudative effusion (protien ratio 0.95). - Cardiology is on board. Will f/u additional pericardial fluid measures.  -CVTS following  -may consider d/c IV rocephin in setting of negative cultures on 7/6  - Continue Colchicine 0.3mg  PO QD for 3 months (end date 02/28/14) this is renally dosed  - Continue Prednisone 20mg  QD for 2 weeks total (11/28/13-12/11/13) followed by tapered regimen  - Continue oxycodone PRN pain  2. Hyperkalemia 2/2 CKD: Patient's K has been creeping up: 5.0 --> 5.7 --> 6.0>>5.2. Patient received dose of Kayexalate on 7/3. EKG showed no changes.  - Continue to monitor telemetry  3. Acute on chronic dCHF: Patient had been receiving serial echos for the pericardial effusion, which exacerbated her CHF. Patient is stable at this time after having pericardial window. However, she does have crackles and tubular breath sounds on exam. CXR shows bilateral pleural effusions.   - Continue to  monitor clinically - will hold Lasix again on 7/5 and restart orals 7/6  - Continue daily weights and strict I&Os   4. Hypomagnesemia: Stable  - Mg 2.7 yesterday - Continue Mag Oxide 400mg  TID  - Will d/c on Mag Oxide 400mg  BID   5. 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 Cr has been higher this admission, remaining elevated above 2.0 >>3.07>>2.8  - holding lasix   6. Anemia of chronic disease  - Hbg 8.9 on admission. Baseline is 8-10. Post-op Hgb 7.7>>8.2  will continue to monitor but hold off on transfusion unless becomes symptomatic.  - Associated with CKD and post-op blood loss  - Continue to monitor    7. HTN  - BP controlled, 130-140s/80-90s  - Continue amlodipine 5mg  PO QD   Diet: Clear liquid diet DVT Prophy: SCDs Dispo: Disposition is deferred at this time, awaiting improvement of current medical problems.    Dispo: Disposition is deferred at this time, awaiting improvement of current medical problems.    The patient does have a current PCP Otho Bellows, MD) and does need an [facility] hospital follow-up appointment after discharge.  The patient does not have transportation limitations that hinder transportation to clinic appointments.  .Services Needed at time of discharge: Y = Yes, Blank = No PT:   OT:   RN:   Equipment:   Other:     LOS: 9 days   Clinton Gallant, MD 12/07/2013, 8:10 AM

## 2013-12-07 NOTE — Progress Notes (Signed)
Pt t/x in wheelchair on monitor to 2W33 without event. NT Laverda Sorenson present for tele hookup and bedside report given to RN. Pt denies request to notify family of room change. Pt's cell phone and charger present upon her t/x to new room  Lorre Munroe

## 2013-12-07 NOTE — Progress Notes (Signed)
      [address]       [place] [address]             [phone]        CARDIOTHORACIC SURGERY PROGRESS NOTE   R3 Days Post-Op Procedure(s) (LRB): SUBXYPHOID PERICARDIAL WINDOW WITH TEE (N/A) INTRAOPERATIVE TRANSESOPHAGEAL ECHOCARDIOGRAM (N/A)  Subjective: Feels better.  SOB improved.  Denies pain.  Objective: Vital signs: BP Readings from Last 1 Encounters:  12/07/13 124/80   Pulse Readings from Last 1 Encounters:  12/07/13 94   Resp Readings from Last 1 Encounters:  12/07/13 9   Temp Readings from Last 1 Encounters:  12/07/13 97.6 F (36.4 C) Oral    Hemodynamics:    Physical Exam:  Rhythm:   Sinus tach  Breath sounds: clear  Heart sounds:  RRR  Incisions:  Clean and dry except for scant serousanguinous drainage from chest tube incision  Abdomen:  Soft, non-distended, non-tender  Extremities:  Warm, well-perfused    Intake/Output from previous day: 07/04 0701 - 07/05 0700 In: 1130 [P.O.:1080; IV Piggyback:50] Out: 0947 [Urine:1755] Intake/Output this shift:    Lab Results:  CBC: Recent Labs  12/06/13 0345 12/07/13 0500  WBC 12.8* 10.9*  HGB 7.7* 8.2*  HCT 24.5* 25.7*  PLT 396 461*    BMET:  Recent Labs  12/06/13 0345 12/07/13 0500  NA 138 137  K 5.2 5.1  CL 96 97  CO2 26 26  GLUCOSE 102* 98  BUN 65* 62*  CREATININE 3.07* 2.83*  CALCIUM 7.1* 6.8*     CBG (last 3)  No results found for this basename: GLUCAP,  in the last 72 hours  ABG    Component Value Date/Time   PHART 7.361 12/05/2013 0516   PCO2ART 46.8* 12/05/2013 0516   PO2ART 81.0 12/05/2013 0516   HCO3 26.4* 12/05/2013 0516   TCO2 28 12/05/2013 0516   ACIDBASEDEF 1.2 12/04/2013 1545   O2SAT 95.0 12/05/2013 0516    CXR: CHEST 2 VIEW  COMPARISON: 12/06/2013  FINDINGS:  The cardiac shadow is stable. The pericardial drain is been removed  in the interval. A right-sided jugular line is again seen in the mid  superior vena cava. No pneumothorax is noted. Small  pleural  effusions are noted bilaterally but improved from the prior study.  Bibasilar opacities are again seen slightly worse on the left than  the right also improved from the prior study. No acute bony  abnormality is noted.  IMPRESSION:  Improving bibasilar changes.  Electronically Signed  By: Inez Catalina M.D.  On: 12/07/2013 07:08   Assessment/Plan: S/P Procedure(s) (LRB): SUBXYPHOID PERICARDIAL WINDOW WITH TEE (N/A) INTRAOPERATIVE TRANSESOPHAGEAL ECHOCARDIOGRAM (N/A)  Progressing reasonably well Plan per medical teams Will follow intermittently but please call if specific problems arise  OWEN,CLARENCE H 12/07/2013 10:02 AM

## 2013-12-07 NOTE — Progress Notes (Signed)
Pt received into room 2w33, pt oriented to room and call bell, pt placed in chair, tele placed on pt and vitals taken, will continue to monitor, pt states no pain at this time Rickard Rhymes, RN

## 2013-12-07 NOTE — Progress Notes (Signed)
[name], female   DOB: Sep 27, 1955, 58 y.o.   MRN: [id number]      Subjective:    SOB continues to improve  Objective:   Temp:  [97.6 F (36.4 C)-97.9 F (36.6 C)] 97.6 F (36.4 C) (07/05 0807) Pulse Rate:  [82-108] 94 (07/05 0700) Resp:  [8-24] 8 (07/05 0700) BP: (96-142)/(67-87) 135/82 mmHg (07/05 0700) SpO2:  [90 %-100 %] 96 % (07/05 0700) Weight:  [102 lb 3.2 oz (46.358 kg)] 102 lb 3.2 oz (46.358 kg) (07/05 0500) Last BM Date: 12/06/13  Filed Weights   12/05/13 0515 12/06/13 0600 12/07/13 0500  Weight: 102 lb 9.6 oz (46.539 kg) 102 lb 1.2 oz (46.3 kg) 102 lb 3.2 oz (46.358 kg)    Intake/Output Summary (Last 24 hours) at 12/07/13 0835 Last data filed at 12/07/13 0600  Gross per 24 hour  Intake   1130 ml  Output   1755 ml  Net   -625 ml    Telemetry: NSR  Exam:  General: NAD  Resp: CTAB  Cardiac: RRR, no m/r/g, no JVD  GO:TLXBWIO soft, NT, ND  MSK:no LE edema  Neuro: no focal deficits  Psych: approriate affect  Lab Results:  Basic Metabolic Panel:  Recent Labs Lab 12/02/13 0411 12/03/13 0550 12/04/13 0300  12/05/13 1500 12/06/13 0345 12/07/13 0500  NA 135* 130* 129*  < > 134* 138 137  K 4.3 4.5 5.0  < > 5.6* 5.2 5.1  CL 91* 87* 86*  < > 96 96 97  CO2 29 30 24   < > 24 26 26   GLUCOSE 95 134* 114*  < > 215* 102* 98  BUN 55* 58* 69*  < > 65* 65* 62*  CREATININE 2.10* 2.37* 2.85*  < > 2.94* 3.07* 2.83*  CALCIUM 8.0* 8.4 8.8  < > 7.4* 7.1* 6.8*  MG 1.5 2.3 2.7*  --   --   --   --   < > = values in this interval not displayed.  Liver Function Tests:  Recent Labs Lab 12/06/13 0345  AST 25  ALT 15  ALKPHOS 241*  BILITOT <0.2*  PROT 7.1  ALBUMIN 2.1*    CBC:  Recent Labs Lab 12/05/13 0330 12/06/13 0345 12/07/13 0500  WBC 16.2* 12.8* 10.9*  HGB 8.4* 7.7* 8.2*  HCT 26.0* 24.5* 25.7*  MCV 97.4 98.0 97.7  PLT 395 396 461*    Cardiac Enzymes: No results found for this basename: CKTOTAL, CKMB, CKMBINDEX,  TROPONINI,  in the last 168 hours  BNP:  Recent Labs  11/28/13 2050  PROBNP 17837.0*    Coagulation:  Recent Labs Lab 12/04/13 1100  INR 1.06    ECG:   Medications:   Scheduled Medications: . acetaminophen  1,000 mg Oral 4 times per day   Or  . acetaminophen (TYLENOL) oral liquid 160 mg/5 mL  1,000 mg Oral 4 times per day  . amLODipine  5 mg Oral Daily  . antiseptic oral rinse  15 mL Mouth Rinse q12n4p  . aspirin EC  81 mg Oral QPM  . bisacodyl  10 mg Oral Daily  . calcium carbonate  6 tablet Oral TID  . cefTRIAXone (ROCEPHIN)  IV  1 g Intravenous Q24H  . colchicine  0.3 mg Oral Daily  . vitamin B-12  500 mcg Oral QPM  . feeding supplement (ENSURE)  1 Container Oral TID BM  . FLUoxetine  10 mg Oral Daily  . folic acid  1 mg Oral Daily  .  gabapentin  600 mg Oral TID  . lidocaine  1 patch Transdermal Q24H  . lipase/protease/amylase  1 capsule Oral TID AC  . loratadine  10 mg Oral Daily  . magnesium oxide  400 mg Oral TID PC & HS  . multivitamin with minerals  1 tablet Oral Daily  . pantoprazole  40 mg Oral Daily  . predniSONE  20 mg Oral Q breakfast  . senna-docusate  1 tablet Oral QHS  . thiamine  100 mg Oral Daily  . Vitamin D (Ergocalciferol)  50,000 Units Oral Q7 days     Infusions: . sodium chloride 10 mL/hr (12/05/13 1014)     PRN Medications:  fentaNYL, ondansetron (ZOFRAN) IV, oxyCODONE     Assessment/Plan     1. Acute pericarditis: Status post subxiphoid pericardial window for large pericardial effusion on 12/04/13. No chest pain this morning. She will remain on colchicine and steroids. Avoiding NSAIDs due to significant renal dysfunction  - pericardial drain removed 12/06/13 by CT surgery. - pericardial fluid: neg AFB, gram stain with rare WBCs and no organisms, cell count with 1889 WBCS 85% PMNs, cultures negative thus far.   2. Acute on chronic diastolic heart failure .  - she is net negative 6.2 liters since admission, negative 682mL  yesterday on lasix 40mg  IV x 1 yesterday. Cr trending down from yesterday after IV lasix decreased.  - CXR this AM with small pleural effusions bilaterally, improved bilabasilar opacities.  - exam improved from volume standpoint, recommend holding diuretics today and resuming oral diuretic tomorrow  3. AKI on CKD  - Cr trending down, follow labs closely in setting of diuresis.       Carlyle Dolly, M.D., F.A.C.C.

## 2013-12-08 ENCOUNTER — Encounter (HOSPITAL_COMMUNITY): Payer: Self-pay | Admitting: Cardiothoracic Surgery

## 2013-12-08 ENCOUNTER — Ambulatory Visit (HOSPITAL_COMMUNITY): Payer: Self-pay

## 2013-12-08 DIAGNOSIS — N179 Acute kidney failure, unspecified: Secondary | ICD-10-CM

## 2013-12-08 DIAGNOSIS — E875 Hyperkalemia: Secondary | ICD-10-CM

## 2013-12-08 DIAGNOSIS — I319 Disease of pericardium, unspecified: Secondary | ICD-10-CM

## 2013-12-08 LAB — BASIC METABOLIC PANEL
Anion gap: 14 (ref 5–15)
BUN: 57 mg/dL — AB (ref 6–23)
CO2: 26 meq/L (ref 19–32)
Calcium: 7.2 mg/dL — ABNORMAL LOW (ref 8.4–10.5)
Chloride: 94 mEq/L — ABNORMAL LOW (ref 96–112)
Creatinine, Ser: 2.54 mg/dL — ABNORMAL HIGH (ref 0.50–1.10)
GFR calc Af Amer: 23 mL/min — ABNORMAL LOW (ref >90)
GFR calc non Af Amer: 20 mL/min — ABNORMAL LOW (ref >90)
GLUCOSE: 92 mg/dL (ref 70–99)
Potassium: 4.9 mEq/L (ref 3.7–5.3)
SODIUM: 134 meq/L — AB (ref 137–147)

## 2013-12-08 LAB — TYPE AND SCREEN
ABO/RH(D): A POS
Antibody Screen: NEGATIVE
Unit division: 0
Unit division: 0
Unit division: 0
Unit division: 0

## 2013-12-08 LAB — CBC
HEMATOCRIT: 26.6 % — AB (ref 36.0–46.0)
Hemoglobin: 8.2 g/dL — ABNORMAL LOW (ref 12.0–15.0)
MCH: 30.6 pg (ref 26.0–34.0)
MCHC: 30.8 g/dL (ref 30.0–36.0)
MCV: 99.3 fL (ref 78.0–100.0)
Platelets: 502 10*3/uL — ABNORMAL HIGH (ref 150–400)
RBC: 2.68 MIL/uL — AB (ref 3.87–5.11)
RDW: 16.2 % — ABNORMAL HIGH (ref 11.5–15.5)
WBC: 10.5 10*3/uL (ref 4.0–10.5)

## 2013-12-08 LAB — POCT I-STAT 7, (LYTES, BLD GAS, ICA,H+H)
ACID-BASE EXCESS: 3 mmol/L — AB (ref 0.0–2.0)
BICARBONATE: 27.6 meq/L — AB (ref 20.0–24.0)
Calcium, Ion: 1.1 mmol/L — ABNORMAL LOW (ref 1.12–1.23)
HCT: 23 % — ABNORMAL LOW (ref 36.0–46.0)
Hemoglobin: 7.8 g/dL — ABNORMAL LOW (ref 12.0–15.0)
O2 SAT: 100 %
Patient temperature: 37.1
Potassium: 5.7 mEq/L — ABNORMAL HIGH (ref 3.7–5.3)
Sodium: 125 mEq/L — ABNORMAL LOW (ref 137–147)
TCO2: 29 mmol/L (ref 0–100)
pCO2 arterial: 44.7 mmHg (ref 35.0–45.0)
pH, Arterial: 7.399 (ref 7.350–7.450)
pO2, Arterial: 178 mmHg — ABNORMAL HIGH (ref 80.0–100.0)

## 2013-12-08 LAB — TISSUE CULTURE: Culture: NO GROWTH

## 2013-12-08 LAB — PATHOLOGIST SMEAR REVIEW

## 2013-12-08 MED ORDER — FUROSEMIDE 10 MG/ML IJ SOLN
40.0000 mg | Freq: Once | INTRAMUSCULAR | Status: AC
  Administered 2013-12-08: 40 mg via INTRAVENOUS
  Filled 2013-12-08: qty 4

## 2013-12-08 NOTE — Progress Notes (Addendum)
Patient: [name] [name] / Admit Date: 11/28/2013 / Date of Encounter: 12/08/2013, 8:31 AM   Subjective: Pt continues to feel improved since pericardiocentesis but still not at baseline. She is SOB on exertion, which is not new but not yet back to home level. This is rellieved with 2L O2.  She is also feeling palpitations that she describes as a "rushing" feeling in her chest (NSR/borderline sinus tach on tele). Denies headache, LOC, dizziness, nausea, vomiting, chest pain.   Objective: Telemetry: NSR/sinus tach  Physical Exam: Blood pressure 135/81, pulse 101, temperature 98 F (36.7 C), temperature source Oral, resp. rate 18, height 5\' 1"  (1.549 m), weight 102 lb 3.2 oz (46.358 kg), SpO2 70.00%. General: Well-developed, cachectic, in no acute distress. Cheeks appear puffy compared to prior. Head: Normocephalic, atraumatic, sclera non-icteric, no xanthomas, nares are without discharge. Neck: Negative for carotid bruits. JVP not elevated. Lungs: Clear bilaterally to auscultation without wheezes, rales, or rhonchi. Breathing is unlabored. Heart: RRR S1 S2 without murmurs, rubs, or gallops.  Abdomen: Nontender, slightly distended with normoactive bowel sounds. Generalized tenderness. No rebound/guarding. Extremities: No clubbing or cyanosis. No edema. Distal pedal pulses are 1+ and equal bilaterally. Neuro: Alert and oriented X 3. Moves all extremities spontaneously. Psych:  Responds to questions appropriately with a normal affect.   Intake/Output Summary (Last 24 hours) at 12/08/13 0831 Last data filed at 12/07/13 1700  Gross per 24 hour  Intake    120 ml  Output      0 ml  Net    120 ml   Inpatient Medications:  . acetaminophen  1,000 mg Oral 4 times per day   Or  . acetaminophen (TYLENOL) oral liquid 160 mg/5 mL  1,000 mg Oral 4 times per day  . amLODipine  5 mg Oral Daily  . antiseptic oral rinse  15 mL Mouth Rinse q12n4p  . aspirin EC  81 mg Oral QPM  . bisacodyl  10 mg Oral  Daily  . calcium carbonate  6 tablet Oral TID  . colchicine  0.3 mg Oral Daily  . vitamin B-12  500 mcg Oral QPM  . feeding supplement (ENSURE)  1 Container Oral TID BM  . FLUoxetine  10 mg Oral Daily  . folic acid  1 mg Oral Daily  . gabapentin  600 mg Oral TID  . loratadine  10 mg Oral Daily  . magnesium oxide  400 mg Oral TID PC & HS  . multivitamin with minerals  1 tablet Oral Daily  . pantoprazole  40 mg Oral Daily  . predniSONE  20 mg Oral Q breakfast  . senna-docusate  1 tablet Oral QHS  . thiamine  100 mg Oral Daily  . Vitamin D (Ergocalciferol)  50,000 Units Oral Q7 days   Infusions:  . sodium chloride 10 mL/hr (12/05/13 1014)    Labs:  Recent Labs  12/07/13 0500 12/08/13 0500  NA 137 134*  K 5.1 4.9  CL 97 94*  CO2 26 26  GLUCOSE 98 92  BUN 62* 57*  CREATININE 2.83* 2.54*  CALCIUM 6.8* 7.2*    Recent Labs  12/06/13 0345  AST 25  ALT 15  ALKPHOS 241*  BILITOT <0.2*  PROT 7.1  ALBUMIN 2.1*    Recent Labs  12/07/13 0500 12/08/13 0500  WBC 10.9* 10.5  NEUTROABS 7.7  --   HGB 8.2* 8.2*  HCT 25.7* 26.6*  MCV 97.7 99.3  PLT 461* 502*   Radiology/Studies:  Dg Chest 2 View  12/07/2013   CLINICAL DATA:  Atelectasis  EXAM: CHEST  2 VIEW  COMPARISON:  12/06/2013  FINDINGS: The cardiac shadow is stable. The pericardial drain is been removed in the interval. A right-sided jugular line is again seen in the mid superior vena cava. No pneumothorax is noted. Small pleural effusions are noted bilaterally but improved from the prior study. Bibasilar opacities are again seen slightly worse on the left than the right also improved from the prior study. No acute bony abnormality is noted.  IMPRESSION: Improving bibasilar changes.   Electronically Signed   By: Inez Catalina M.D.   On: 12/07/2013 07:08   Dg Chest 2 View  12/02/2013   CLINICAL DATA:  Shortness of breath  EXAM: CHEST  2 VIEW  COMPARISON:  11/28/2013  FINDINGS: Cardiac shadow is again enlarged. Bibasilar  infiltrates are seen left greater than right with associated effusion. This is increased slightly in the interval from the prior exam particularly in the left lower lobe.  IMPRESSION: Slight increase in the degree of basilar infiltrates with associated effusions.   Electronically Signed   By: Inez Catalina M.D.   On: 12/02/2013 15:44   Dg Chest 2 View  11/28/2013   CLINICAL DATA:  increased sob  EXAM: CHEST  2 VIEW  COMPARISON:  Prior radiograph from 11/26/2013  FINDINGS: Cardiomegaly is stable as compared to prior exam.  Lungs are mildly hypoinflated. There is persistent diffuse pulmonary edema, slightly improved from prior. Bilateral pleural effusions are also slightly improved. No definite new focal infiltrate. There is no pneumothorax.  Chronic fracture deformity of the proximal left humerus noted, unchanged. No acute osseous abnormality.  IMPRESSION: Slight interval improvement in diffuse pulmonary edema with decreased size of bilateral pleural effusions, consistent with improving CHF.   Electronically Signed   By: Jeannine Boga M.D.   On: 11/28/2013 21:47   Dg Chest 2 View  11/22/2013   CLINICAL DATA:  Two week history of cough  EXAM: CHEST  2 VIEW  COMPARISON:  Prior chest x-ray 09/25/2013  FINDINGS: Stable cardiac and mediastinal contours. Interval resolution of right lower lobe infiltrate compared to 09/25/2013. No new airspace consolidation. No pulmonary edema, pleural effusion or pneumothorax. No suspicious pulmonary nodule or mass. Stable background bronchitic changes. No acute osseous abnormality.  IMPRESSION: No active cardiopulmonary disease.  Interval resolution of right lower lobe infiltrate compared to 09/25/2013.   Electronically Signed   By: Jacqulynn Cadet M.D.   On: 11/22/2013 10:29   Dg Chest Port 1 View  12/06/2013   CLINICAL DATA:  Status post surgery.  EXAM: PORTABLE CHEST - 1 VIEW  COMPARISON:  One-view chest 12/05/2013  FINDINGS: A right IJ line is stable in position. A  left-sided chest tube is stable. The heart is enlarged. Bilateral pleural effusions are similar to the prior study. Bibasilar airspace disease is noted. Mild edema is unchanged. Posttraumatic changes of the humerus are noted bilaterally.  IMPRESSION: Stable bilateral pleural effusions and bibasilar airspace disease, likely reflecting atelectasis.  Stable mild edema.  The support apparatus is stable.   Electronically Signed   By: Lawrence Santiago M.D.   On: 12/06/2013 07:25   Dg Chest Port 1 View  12/05/2013   CLINICAL DATA:  Pericardial effusion  EXAM: PORTABLE CHEST - 1 VIEW  COMPARISON:  12/04/2013  FINDINGS: A pericardial drain is again noted in the midline in the inferior aspect of the chest. A right-sided jugular central venous line is noted in the mid superior vena cava.  The cardiac shadow remains enlarged. Persistent bilateral effusions and infiltrate are seen. Vascular congestion is again noted. Changes consistent with prior bilateral humeral fractures are seen.  IMPRESSION: Stable appearance of the chest when compared with the prior exam.   Electronically Signed   By: Inez Catalina M.D.   On: 12/05/2013 07:31   Dg Chest Port 1 View  12/04/2013   CLINICAL DATA:  Postop thoracic surgery.  EXAM: PORTABLE CHEST - 1 VIEW  COMPARISON:  Same day.  FINDINGS: Endotracheal tube has been removed. Right internal jugular catheter is unchanged in position with tip in expected position of the SVC. Pericardial drain is unchanged. No pneumothorax is noted. Mild left pleural effusion is noted along the lateral wall of the left hemi thorax. Stable left basilar atelectasis. Mild central pulmonary vascular congestion and probable perihilar edema is noted.  IMPRESSION: Endotracheal tube has been removed. Stable mild left pleural effusion is noted along left lateral chest wall. Stable left basilar atelectasis. Mild central pulmonary vascular congestion with associated perihilar edema is again noted.   Electronically Signed   By:  Sabino Dick M.D.   On: 12/04/2013 15:43   Dg Chest Portable 1 View  12/04/2013   CLINICAL DATA:  Subxiphoid pericardial window  EXAM: PORTABLE CHEST - 1 VIEW  COMPARISON:  12/02/2013  FINDINGS: Endotracheal tube in good position. Right jugular catheter tip in the SVC. No pneumothorax.  Interval development of pulmonary edema.  Mild to moderate left pleural effusion with some migration of pleural fluid into the apex. Left lower lobe atelectasis is present. Small right effusion is present.  Large bore thoracostomy tube in the pericardium.  IMPRESSION: Endotracheal tube in good position.  Pericardial drain in place.  Progression of pulmonary edema. Bilateral pleural effusions and left lower lobe atelectasis are present.   Electronically Signed   By: Franchot Gallo M.D.   On: 12/04/2013 15:16   Dg Chest Port 1 View  11/26/2013   CLINICAL DATA:  Pulmonary  EXAM: PORTABLE CHEST - 1 VIEW  COMPARISON:  11/25/2013  FINDINGS: Cardiac enlargement. Pulmonary vascular congestion and bilateral edema is unchanged. Bilateral effusion and bibasilar atelectasis also unchanged.  IMPRESSION: Congestive heart failure with edema and effusions unchanged from the prior study.   Electronically Signed   By: Franchot Gallo M.D.   On: 11/26/2013 07:34   Dg Chest Port 1 View  11/25/2013   CLINICAL DATA:  Followup pulmonary edema  EXAM: PORTABLE CHEST - 1 VIEW  COMPARISON:  11/24/2013  FINDINGS: Bilateral effusions with perihilar and more confluent lung base opacity is stable from the prior exam. Cardiac silhouette is mildly enlarged. No pneumothorax.  IMPRESSION: No significant change from the previous day's study. Findings consistent with pulmonary edema with bilateral effusions.   Electronically Signed   By: Lajean Manes M.D.   On: 11/25/2013 07:19   Dg Chest Port 1 View  11/24/2013   CLINICAL DATA:  Chest pain, shortness of breath.  EXAM: PORTABLE CHEST - 1 VIEW  COMPARISON:  November 22, 2013.  FINDINGS: Mild cardiomegaly is noted.  Bilateral perihilar and basilar opacities are noted most consistent with pulmonary edema with mild associated pleural effusions. No pneumothorax is noted. Bony thorax is intact.  IMPRESSION: Bilateral perihilar and basilar opacities are noted most consistent with pulmonary edema. Mild bilateral pleural effusions are noted.   Electronically Signed   By: Sabino Dick M.D.   On: 11/24/2013 08:29     Assessment and Plan   1. Acute pericarditis s/p  pericardial window 12/04/13: etiology includes viral pericarditis given recent exacerbation of bronchitis vs autoimmune disease (ANA significantly abnormal, family history of SLE). TB Quantiferon was indeterminate but pericardial fluid without AFB thus far - will defer further eval of + quanitferon and lupus workup to IM. Would continue colchicine at 0.3 given renal function and prednisone taper as outlined. No NSAIDs with CKD. Fluid: neg AFB so far, gram stain with rare WBCs and no organisms, cell count with 1889 WBCS 85% PMNs, cultures negative thus far, pathology pending. ? Discontinue aspirin given that pericardial effusion was bloody. 2. Acute on chronic diastolic heart failure: will discuss resumption of diuretic with MD. Suspect her hypoalbuminemia (likely related to urinary protein loss) is contributing as well. 3. 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 Hyperkalemia improved. 4. Leukocytosis/anemia: per IM. Leukocytosis improving. Wonder if there is some contribution to her symptoms due to Hgb in the 8 range. We could consider transfusion.  5. HTN: Follow. 6. Hypomagnesemia: primary team is managing lytes. PPI can contribute to low Mg so could consider changing to H2 blocker if appropriate.  Signed, Melina Copa PA-C    The patient was seen, examined and discussed with Melina Copa, PA-C and I agree with the above.   A very complex 58 year old female with recent pericardial window placement on steroid taper, colchicine, acute on CKD, acute  on chronic diastolic CHF with unknown underlying diagnosis of specific autoimmune disease. ANA elevated. Now worsening SOB and increasing abdominal girth and tenderness.  We will restart diuretics to relieve fluid overload. I have feeling that there is an autoimmune culprit behind her problems (pericarditis, anemia of chronic diseases, acute kidney failure) that needs to be determined. Since rheumatology doesn't come to the hospital maybe a phone consult can be arranged by a primary team.   Dorothy Spark 12/08/2013

## 2013-12-08 NOTE — Progress Notes (Addendum)
Subjective: Patient reports she is feeling well today. She reports some pain in the area of where the chest tube was placed and SOB when walking. She denies fever, chills, CP, N/V.  Objective: Vital signs in last 24 hours: Filed Vitals:   12/08/13 0540 12/08/13 0805 12/08/13 0815 12/08/13 0830  BP: 166/69 135/81    Pulse: 96 101    Temp: 98 F (36.7 C)     TempSrc: Oral     Resp: 18     Height:      Weight:      SpO2: 100% 100% 70% 98%   Weight change:   Intake/Output Summary (Last 24 hours) at 12/08/13 0934 Last data filed at 12/08/13 0830  Gross per 24 hour  Intake    240 ml  Output      0 ml  Net    240 ml   Physical Exam General: Alert, cooperative, laying comfortably in bed  HEENT: N/C, A/T, sclera anicteric, mucus membranes moist  Neck: RIJ central line dressing c/d/i, no lymphadenopathy  CV: tachycardic in low 100s, normal S1/S2, no m/g/r  Pulm: bilateral crackles, respirations nonlabored, subxyphoid dressing c/d/i  GI: BS+, nontender, nondistended  Ext: moves all, warm, no edema  Lab Results: Basic Metabolic Panel:  Recent Labs  12/07/13 0500 12/08/13 0500  NA 137 134*  K 5.1 4.9  CL 97 94*  CO2 26 26  GLUCOSE 98 92  BUN 62* 57*  CREATININE 2.83* 2.54*  CALCIUM 6.8* 7.2*   Liver Function Tests:  Recent Labs  12/06/13 0345  AST 25  ALT 15  ALKPHOS 241*  BILITOT <0.2*  PROT 7.1  ALBUMIN 2.1*   No results found for this basename: LIPASE, AMYLASE,  in the last 72 hours No results found for this basename: AMMONIA,  in the last 72 hours CBC:  Recent Labs  12/07/13 0500 12/08/13 0500  WBC 10.9* 10.5  NEUTROABS 7.7  --   HGB 8.2* 8.2*  HCT 25.7* 26.6*  MCV 97.7 99.3  PLT 461* 502*   Cardiac Enzymes: No results found for this basename: CKTOTAL, CKMB, CKMBINDEX, TROPONINI,  in the last 72 hours BNP: No results found for this basename: PROBNP,  in the last 72 hours D-Dimer: No results found for this basename: DDIMER,  in the last 72  hours CBG: No results found for this basename: GLUCAP,  in the last 72 hours Hemoglobin A1C: No results found for this basename: HGBA1C,  in the last 72 hours Fasting Lipid Panel: No results found for this basename: CHOL, HDL, LDLCALC, TRIG, CHOLHDL, LDLDIRECT,  in the last 72 hours Thyroid Function Tests: No results found for this basename: TSH, T4TOTAL, FREET4, T3FREE, THYROIDAB,  in the last 72 hours Anemia Panel: No results found for this basename: VITAMINB12, FOLATE, FERRITIN, TIBC, IRON, RETICCTPCT,  in the last 72 hours Coagulation: No results found for this basename: LABPROT, INR,  in the last 72 hours Urine Drug Screen: Drugs of Abuse     Component Value Date/Time   LABOPIA POSITIVE* 11/22/2013 1406   LABOPIA NEG 09/18/2011 0936   COCAINSCRNUR NONE DETECTED 11/22/2013 1406   COCAINSCRNUR NEG 09/18/2011 0936   LABBENZ NONE DETECTED 11/22/2013 1406   LABBENZ NEG 09/18/2011 0936   LABBENZ NEG 04/10/2011 1130   AMPHETMU NONE DETECTED 11/22/2013 1406   AMPHETMU NEG 04/10/2011 1130   THCU NONE DETECTED 11/22/2013 1406   LABBARB NONE DETECTED 11/22/2013 1406   LABBARB NEG 09/18/2011 0936    Alcohol Level:  No results found for this basename: ETH,  in the last 72 hours Urinalysis: No results found for this basename: COLORURINE, APPERANCEUR, LABSPEC, [place], GLUCOSEU, HGBUR, BILIRUBINUR, KETONESUR, PROTEINUR, UROBILINOGEN, NITRITE, LEUKOCYTESUR,  in the last 72 hours Misc. Labs: Micro Results: Recent Results (from the past 240 hour(s))  SURGICAL PCR SCREEN     Status: None   Collection Time    12/04/13 12:13 PM      Result Value Ref Range Status   MRSA, PCR NEGATIVE  NEGATIVE Final   Staphylococcus aureus NEGATIVE  NEGATIVE Final   Comment:            The Xpert SA Assay (FDA     approved for NASAL specimens     in patients over 46 years of age),     is one component of     a comprehensive surveillance     program.  Test performance has     been validated by [employer] for  patients greater     than or equal to 37 year old.     It is not intended     to diagnose infection nor to     guide or monitor treatment.  TISSUE CULTURE     Status: None   Collection Time    12/04/13 12:55 PM      Result Value Ref Range Status   Specimen Description TISSUE PERICARDIAL   Final   Special Requests PATIENT ON FOLLOWING ZINACEF   Final   Gram Stain     Final   Value: RARE WBC PRESENT, PREDOMINANTLY PMN     NO ORGANISMS SEEN     Performed at [facility]   Culture     Final   Value: NO GROWTH 2 DAYS     Performed at [facility]   Report Status PENDING   Incomplete  AFB CULTURE WITH SMEAR     Status: None   Collection Time    12/04/13 12:55 PM      Result Value Ref Range Status   Specimen Description TISSUE PERICARDIAL   Final   Special Requests PATIENT ON FOLLOWING ZINACEF   Final   Acid Fast Smear     Final   Value: NO ACID FAST BACILLI SEEN     Performed at [facility]   Culture     Final   Value: CULTURE WILL BE EXAMINED FOR 6 WEEKS BEFORE ISSUING A FINAL REPORT     Performed at [facility]   Report Status PENDING   Incomplete  BODY FLUID CULTURE     Status: None   Collection Time    12/04/13  2:17 PM      Result Value Ref Range Status   Specimen Description FLUID PERICARDIAL   Final   Special Requests PATIENT ON FOLLOWING ZINACEF   Final   Gram Stain     Final   Value: WBC PRESENT,BOTH PMN AND MONONUCLEAR     NO ORGANISMS SEEN     Performed at [facility]   Culture     Final   Value: NO GROWTH 3 DAYS     Performed at [facility]   Report Status 12/07/2013 FINAL   Final   Studies/Results: Dg Chest 2 View  12/07/2013   CLINICAL DATA:  Atelectasis  EXAM: CHEST  2 VIEW  COMPARISON:  12/06/2013  FINDINGS: The cardiac shadow is stable. The pericardial drain is been removed in the interval.  A right-sided jugular line is again seen in the mid superior vena cava. No pneumothorax is noted. Small pleural  effusions are noted bilaterally but improved from the prior study. Bibasilar opacities are again seen slightly worse on the left than the right also improved from the prior study. No acute bony abnormality is noted.  IMPRESSION: Improving bibasilar changes.   Electronically Signed   By: Inez Catalina M.D.   On: 12/07/2013 07:08   Medications: I have reviewed the patient's current medications. Scheduled Meds: . acetaminophen  1,000 mg Oral 4 times per day   Or  . acetaminophen (TYLENOL) oral liquid 160 mg/5 mL  1,000 mg Oral 4 times per day  . amLODipine  5 mg Oral Daily  . antiseptic oral rinse  15 mL Mouth Rinse q12n4p  . aspirin EC  81 mg Oral QPM  . bisacodyl  10 mg Oral Daily  . calcium carbonate  6 tablet Oral TID  . colchicine  0.3 mg Oral Daily  . vitamin B-12  500 mcg Oral QPM  . feeding supplement (ENSURE)  1 Container Oral TID BM  . FLUoxetine  10 mg Oral Daily  . folic acid  1 mg Oral Daily  . gabapentin  600 mg Oral TID  . loratadine  10 mg Oral Daily  . magnesium oxide  400 mg Oral TID PC & HS  . multivitamin with minerals  1 tablet Oral Daily  . pantoprazole  40 mg Oral Daily  . predniSONE  20 mg Oral Q breakfast  . senna-docusate  1 tablet Oral QHS  . thiamine  100 mg Oral Daily  . Vitamin D (Ergocalciferol)  50,000 Units Oral Q7 days   Continuous Infusions: . sodium chloride 10 mL/hr (12/05/13 1014)   PRN Meds:.ondansetron (ZOFRAN) IV, oxyCODONE, sodium chloride Assessment/Plan: Ms. 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   1. Acute pericarditis with pericardial effusion s/p pericardial window 12/04/13: Etiology for pericarditis most likely inflammatory (pericardial fluid has high neutrophils, low lymphocytes and monocytes) vs. viral considering patient had  acute bronchitis a few weeks ago. Concern for malignancy as possible etiology less likely since patient did not have lesions on CXR and has no hx of cancer (most common usually breast-normal mammo 2014) or lung- as above, neg CXR). Prior to surgery patient had mild chest pain, SOB, tachycardia in 110s-120s, JVD, friction rub, pulsus alternans on exam. EKG showed low voltage. A repeat echo was done on 12/04/2013 that showed the pericardial effusion persisting and possibly larger than before without tamponade. Window on 12/04/13. Cell count from the pericardial fluid showed WBC 1889, Neutrophils 85, Lymphs 12, Monocytes 3. Prelim AFB culture negative, Prelim tissue culture NGTD just PMNs, cytology still pending, serum ANA + (1:320) Anti-dsDNA neg, quanitferon negative, lights criteria suggest exudative effusion (protien ratio 0.95).  - Cardiology is on board. Will f/u additional pericardial fluid measures.  - CVTS following  - Hold aspirin since pericardial fluid bloody - d/c'ing IV rocephin since negative cultures (7/6) - Continue Colchicine 0.3mg  PO QD for 3 months (end date 02/28/14) this is renally dosed  - Continue Prednisone 20mg  QD for 2 weeks total (11/28/13-12/11/13) followed by tapered regimen  - Continue oxycodone PRN pain   2. Hyperkalemia 2/2 CKD: Patient's K improving 6.0-->5.2--> 4.9. Patient received dose of Kayexalate on  7/3. EKG showed no changes.  - Continue to monitor telemetry   3. Acute on chronic dCHF: Patient had been receiving serial echos for the pericardial effusion, which exacerbated her CHF. Patient is stable at this time after having pericardial window. However, she does have crackles and tubular breath sounds on exam. CXR on 12/07/13 showed small bilateral pleural effusions.  - Continue to monitor clinically  - Continue to hold Lasix  - Continue daily weights and strict I&Os   4. Hypomagnesemia: Stable  - Mg 2.7 on 7/2 - Continue Mag Oxide 400mg  TID  - Will d/c on Mag Oxide  400mg  BID   5. 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 Cr has been higher this admission, remaining elevated above 2.0 >>3.07>>2.8>>2.54. Improving steadily. - Continue to monitor  - Continue to hold Lasix  6. Anemia of chronic disease  - Hbg 8.9 on admission. Baseline is 8-10. Post-op Hgb 7.7>>8.2 will continue to monitor but hold off on transfusion unless becomes symptomatic.  - Associated with CKD and post-op blood loss  - Continue to monitor   7. HTN  - BP controlled, 130-140s/80-90s  - Continue amlodipine 5mg  PO QD   Diet: Heart healthy  DVT Prophy: SCDs  Dispo: Disposition is deferred at this time, awaiting improvement of current medical problems.    The patient does have a current PCP Otho Bellows, MD) and does need an [facility] hospital follow-up appointment after discharge.  The patient does not have transportation limitations that hinder transportation to clinic appointments.  .Services Needed at time of discharge: Y = Yes, Blank = No PT:   OT:   RN:   Equipment:   Other:     LOS: 10 days   Albin Felling, MD 12/08/2013, 9:34 AM Attending physician note: Patient examined and status reviewed together with  medical resident Dr. Albin Felling and I concur with assessment and  management plan outlined above.  The patient is recuperating nicely now 4 days status post pericardial window procedure for a pericardial effusion. Fluid was a transudate with increased neutrophils. Bacterial cultures negative so far. History of recent pneumonia. Suspect viral etiology of the effusion. The pericardial drainage tube has been removed. Chest wounds are healing nicely. We will begin discharge planning.  Murriel Hopper, MD, [place]  Hematology-Oncology/Internal Medicine

## 2013-12-09 DIAGNOSIS — D649 Anemia, unspecified: Secondary | ICD-10-CM

## 2013-12-09 LAB — BASIC METABOLIC PANEL
Anion gap: 13 (ref 5–15)
BUN: 59 mg/dL — ABNORMAL HIGH (ref 6–23)
CHLORIDE: 96 meq/L (ref 96–112)
CO2: 26 mEq/L (ref 19–32)
CREATININE: 2.3 mg/dL — AB (ref 0.50–1.10)
Calcium: 7.9 mg/dL — ABNORMAL LOW (ref 8.4–10.5)
GFR calc non Af Amer: 23 mL/min — ABNORMAL LOW (ref >90)
GFR, EST AFRICAN AMERICAN: 26 mL/min — AB (ref >90)
Glucose, Bld: 97 mg/dL (ref 70–99)
POTASSIUM: 5.2 meq/L (ref 3.7–5.3)
SODIUM: 135 meq/L — AB (ref 137–147)

## 2013-12-09 MED ORDER — SENNOSIDES-DOCUSATE SODIUM 8.6-50 MG PO TABS: 1.0000 | ORAL_TABLET | Freq: Every day | ORAL | Status: DC

## 2013-12-09 MED ORDER — METOPROLOL TARTRATE 12.5 MG HALF TABLET: 12.5000 mg | ORAL_TABLET | Freq: Two times a day (BID) | ORAL | Status: DC

## 2013-12-09 MED ORDER — METOPROLOL TARTRATE 12.5 MG HALF TABLET
12.5000 mg | ORAL_TABLET | Freq: Two times a day (BID) | ORAL | Status: DC
  Filled 2013-12-09: qty 1

## 2013-12-09 MED ORDER — COLCHICINE 0.6 MG PO TABS: 0.3000 mg | ORAL_TABLET | Freq: Every day | ORAL | Status: DC

## 2013-12-09 MED ORDER — PREDNISONE 5 MG PO TABS: 10.0000 mg | ORAL_TABLET | Freq: Every day | ORAL | Status: DC

## 2013-12-09 MED ORDER — OXYCODONE HCL 5 MG PO TABS: 5.0000 mg | ORAL_TABLET | ORAL | Status: DC | PRN

## 2013-12-09 NOTE — Progress Notes (Addendum)
Came to visit patient at bedside to discuss [place] Management. However, patient was discharged prior to visit.  Linden Dolin- RN, BSN- [facility] [phone]

## 2013-12-09 NOTE — Progress Notes (Signed)
Patient: [name] [name] / Admit Date: 11/28/2013 / Date of Encounter: 12/09/2013, 9:06 AM   Subjective: Feeling much better. [place].   Objective: Telemetry: low grade sinus tach Physical Exam: Blood pressure 153/91, pulse 98, temperature 98.3 F (36.8 C), temperature source Oral, resp. rate 18, height 5\' 1"  (1.549 m), weight 99 lb 1.6 oz (44.951 kg), SpO2 99.00%. General: Well-developed, cachectic, in no acute distress.  Head: Normocephalic, atraumatic, sclera non-icteric, no xanthomas, nares are without discharge.  Neck: Negative for carotid bruits. JVP not elevated.  Lungs: Clear bilaterally to auscultation without wheezes, rales, or rhonchi. Breathing is unlabored.  Heart: RRR borderline tachycardic S1 S2 without murmurs, rubs, or gallops.  Abdomen: Nontender, non-distended, normoactive bowel sounds. Generalized tenderness. No rebound/guarding.  Extremities: No clubbing or cyanosis. No edema. Distal pedal pulses are 2+ and equal bilaterally.  Neuro: Alert and oriented X 3. Moves all extremities spontaneously.  Psych: Responds to questions appropriately with a normal affect.   Intake/Output Summary (Last 24 hours) at 12/09/13 0906 Last data filed at 12/09/13 0700  Gross per 24 hour  Intake    360 ml  Output      0 ml  Net    360 ml    Inpatient Medications:  . acetaminophen  1,000 mg Oral 4 times per day   Or  . acetaminophen (TYLENOL) oral liquid 160 mg/5 mL  1,000 mg Oral 4 times per day  . amLODipine  5 mg Oral Daily  . antiseptic oral rinse  15 mL Mouth Rinse q12n4p  . bisacodyl  10 mg Oral Daily  . calcium carbonate  6 tablet Oral TID  . colchicine  0.3 mg Oral Daily  . vitamin B-12  500 mcg Oral QPM  . feeding supplement (ENSURE)  1 Container Oral TID BM  . FLUoxetine  10 mg Oral Daily  . folic acid  1 mg Oral Daily  . gabapentin  600 mg Oral TID  . loratadine  10 mg Oral Daily  . magnesium oxide  400 mg Oral TID PC & HS  . multivitamin with minerals  1 tablet  Oral Daily  . pantoprazole  40 mg Oral Daily  . predniSONE  20 mg Oral Q breakfast  . senna-docusate  1 tablet Oral QHS  . thiamine  100 mg Oral Daily  . Vitamin D (Ergocalciferol)  50,000 Units Oral Q7 days   Infusions:  . sodium chloride 10 mL/hr (12/05/13 1014)    Labs:  Recent Labs  12/08/13 0500 12/09/13 0456  NA 134* 135*  K 4.9 5.2  CL 94* 96  CO2 26 26  GLUCOSE 92 97  BUN 57* 59*  CREATININE 2.54* 2.30*  CALCIUM 7.2* 7.9*    Recent Labs  12/07/13 0500 12/08/13 0500  WBC 10.9* 10.5  NEUTROABS 7.7  --   HGB 8.2* 8.2*  HCT 25.7* 26.6*  MCV 97.7 99.3  PLT 461* 502*    Radiology/Studies:  Dg Chest 2 View 12/07/2013   CLINICAL DATA:  Atelectasis  EXAM: CHEST  2 VIEW  COMPARISON:  12/06/2013  FINDINGS: The cardiac shadow is stable. The pericardial drain is been removed in the interval. A right-sided jugular line is again seen in the mid superior vena cava. No pneumothorax is noted. Small pleural effusions are noted bilaterally but improved from the prior study. Bibasilar opacities are again seen slightly worse on the left than the right also improved from the prior study. No acute bony abnormality is noted.  IMPRESSION: Improving bibasilar changes.  Electronically Signed   By: Inez Catalina M.D.   On: 12/07/2013 07:08     Assessment and Plan  1. Acute pericarditis s/p pericardial window 12/04/13: etiology includes viral pericarditis given recent exacerbation of bronchitis vs autoimmune disease (ANA significantly abnormal, family history of SLE). TB Quantiferon was indeterminate but pericardial fluid without AFB thus far - will defer further eval of + quanitferon and further autoimmune workup to IM. Primary team may want to touch base with rheumatology to find out what else should be sent off prior to discharge. Would continue colchicine at 0.3 given renal function and prednisone taper as outlined. No NSAIDs with CKD. Fluid: neg AFB so far, gram stain with rare WBCs and no  organisms, cell count with 1889 WBCS 85% PMNs, cultures negative thus far, pathologist smear review shows reactive mesothelial cells and macrophages, no atypia, + acute inflammation. Pathology of pericardium: mildly inflamed mesothial lined tissue with associated fibrinous material; no evidence of malignancy.  2. Acute on chronic diastolic heart failure: Intermittently diuresed this admission then held in setting of AKI. Suspect her hypoalbuminemia (likely related to urinary protein loss) is contributing as well. S/p 40mg  IV Lasix yesterday. Wt -3lb (I/O not accurate). Will discuss transition to oral form today. 3. 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 Hyperkalemia improved. Should follow renal low potassium diet. 4. Leukocytosis/anemia: Leukocytosis improving. Wonder if there is some contribution to her symptoms due to Hgb in the 8 range although this appears chronic. Per IM. 5. HTN: Follow. Since BP is stable, consider adding low dose beta blocker. Her sinus tach appears chronic but improved.  6. Hypomagnesemia: primary team is managing lytes. PPI can contribute to low Mg so could consider changing to H2 blocker if appropriate.   Will discuss f/u plans with cardiology MD along with +/- diuretic/BB recs.  Signed,  Melina Copa PA-C  I have seen and examined the patient along with Melina Copa PA-C.  I have reviewed the chart, notes and new data.  I agree with PA's note.  Much improved. BP now high, add low dose beta blocker. Rheumatology follow up  PLAN: F/U cardiology for diuretic and beta blocker titration.  Sanda Klein, MD, [place] [phone] 12/09/2013, 2:10 PM

## 2013-12-09 NOTE — Progress Notes (Signed)
SATURATION QUALIFICATIONS: (This note is used to comply with regulatory documentation for home oxygen)  Patient Saturations on Room Air at Rest = 100%  Patient Saturations on Room Air while Ambulating = 98% 

## 2013-12-09 NOTE — Progress Notes (Signed)
Central line discontinued per order following correct protocol. End entacted, no bleeding noted, pressure help for 5 minutes. Dressing applied. VS and bedrest per protocol. [name] [name] R

## 2013-12-09 NOTE — Progress Notes (Signed)
Spoke with resident re: recs for Lopressor 12.5mg  BID, agree with Lasix 40mg  daily. Pt does not need appt for tomorrow in our office - too soon. I have left a message on our office's scheduling voicemail requesting a follow-up appointment in 1-2 weeks, and our office will call the patient with this appointment. Amorita Vanrossum PA-C

## 2013-12-09 NOTE — Progress Notes (Signed)
Went over discharge instructions with patient. Educated patient on new medications. Patient updated on follow up appointments. Given paper prescriptions. Patient has no additional questions or concerns related to discharge instructions. Central line discontinued. Patient taken off cardiac monitor. Patient discharged home. Elmarie Shiley R

## 2013-12-09 NOTE — Progress Notes (Signed)
Subjective: Patient feeling well today and is ready to go home. She denies CP, SOB, palpitations, and fatigue.   Objective: Vital signs in last 24 hours: Filed Vitals:   12/09/13 0506 12/09/13 1148 12/09/13 1200 12/09/13 1359  BP: 153/91 126/104 151/94 136/86  Pulse: 98 102 109 105  Temp: 98.3 F (36.8 C)     TempSrc: Oral     Resp: 18 18  18   Height:      Weight: 99 lb 1.6 oz (44.951 kg)     SpO2: 99% 95% 96% 98%   Weight change:   Intake/Output Summary (Last 24 hours) at 12/09/13 1537 Last data filed at 12/09/13 1300  Gross per 24 hour  Intake    840 ml  Output      2 ml  Net    838 ml   Physical Exam General: Alert, cooperative, laying comfortably in bed  HEENT: N/C, A/T, sclera anicteric, mucus membranes moist  Neck: RIJ central line dressing c/d/i, no lymphadenopathy  CV: tachycardic in low 100s, normal S1/S2, no m/g/r  Pulm: CTA bilaterally, respirations nonlabored, subxyphoid dressing c/d/i  GI: BS+, nontender, nondistended  Ext: moves all, warm, no edema  Lab Results: Basic Metabolic Panel:  Recent Labs  12/08/13 0500 12/09/13 0456  NA 134* 135*  K 4.9 5.2  CL 94* 96  CO2 26 26  GLUCOSE 92 97  BUN 57* 59*  CREATININE 2.54* 2.30*  CALCIUM 7.2* 7.9*   Liver Function Tests: No results found for this basename: AST, ALT, ALKPHOS, BILITOT, PROT, ALBUMIN,  in the last 72 hours No results found for this basename: LIPASE, AMYLASE,  in the last 72 hours No results found for this basename: AMMONIA,  in the last 72 hours CBC:  Recent Labs  12/07/13 0500 12/08/13 0500  WBC 10.9* 10.5  NEUTROABS 7.7  --   HGB 8.2* 8.2*  HCT 25.7* 26.6*  MCV 97.7 99.3  PLT 461* 502*   Cardiac Enzymes: No results found for this basename: CKTOTAL, CKMB, CKMBINDEX, TROPONINI,  in the last 72 hours BNP: No results found for this basename: PROBNP,  in the last 72 hours D-Dimer: No results found for this basename: DDIMER,  in the last 72 hours CBG: No results found  for this basename: GLUCAP,  in the last 72 hours Hemoglobin A1C: No results found for this basename: HGBA1C,  in the last 72 hours Fasting Lipid Panel: No results found for this basename: CHOL, HDL, LDLCALC, TRIG, CHOLHDL, LDLDIRECT,  in the last 72 hours Thyroid Function Tests: No results found for this basename: TSH, T4TOTAL, FREET4, T3FREE, THYROIDAB,  in the last 72 hours Anemia Panel: No results found for this basename: VITAMINB12, FOLATE, FERRITIN, TIBC, IRON, RETICCTPCT,  in the last 72 hours Coagulation: No results found for this basename: LABPROT, INR,  in the last 72 hours Urine Drug Screen: Drugs of Abuse     Component Value Date/Time   LABOPIA POSITIVE* 11/22/2013 1406   LABOPIA NEG 09/18/2011 0936   COCAINSCRNUR NONE DETECTED 11/22/2013 1406   COCAINSCRNUR NEG 09/18/2011 0936   LABBENZ NONE DETECTED 11/22/2013 1406   LABBENZ NEG 09/18/2011 0936   LABBENZ NEG 04/10/2011 1130   AMPHETMU NONE DETECTED 11/22/2013 1406   AMPHETMU NEG 04/10/2011 1130   THCU NONE DETECTED 11/22/2013 1406   LABBARB NONE DETECTED 11/22/2013 1406   LABBARB NEG 09/18/2011 0936    Alcohol Level: No results found for this basename: ETH,  in the last 72 hours Urinalysis: No results  found for this basename: COLORURINE, APPERANCEUR, LABSPEC, Bruno, GLUCOSEU, HGBUR, BILIRUBINUR, KETONESUR, PROTEINUR, UROBILINOGEN, NITRITE, LEUKOCYTESUR,  in the last 72 hours  Micro Results: Recent Results (from the past 240 hour(s))  SURGICAL PCR SCREEN     Status: None   Collection Time    12/04/13 12:13 PM      Result Value Ref Range Status   MRSA, PCR NEGATIVE  NEGATIVE Final   Staphylococcus aureus NEGATIVE  NEGATIVE Final   Comment:            The Xpert SA Assay (FDA     approved for NASAL specimens     in patients over 72 years of age),     is one component of     a comprehensive surveillance     program.  Test performance has     been validated by [employer] for patients greater     than or equal to 27  year old.     It is not intended     to diagnose infection nor to     guide or monitor treatment.  TISSUE CULTURE     Status: None   Collection Time    12/04/13 12:55 PM      Result Value Ref Range Status   Specimen Description TISSUE PERICARDIAL   Final   Special Requests PATIENT ON FOLLOWING ZINACEF   Final   Gram Stain     Final   Value: RARE WBC PRESENT, PREDOMINANTLY PMN     NO ORGANISMS SEEN     Performed at [facility]   Culture     Final   Value: NO GROWTH 3 DAYS     Performed at [facility]   Report Status 12/08/2013 FINAL   Final  AFB CULTURE WITH SMEAR     Status: None   Collection Time    12/04/13 12:55 PM      Result Value Ref Range Status   Specimen Description TISSUE PERICARDIAL   Final   Special Requests PATIENT ON FOLLOWING ZINACEF   Final   Acid Fast Smear     Final   Value: NO ACID FAST BACILLI SEEN     Performed at [facility]   Culture     Final   Value: CULTURE WILL BE EXAMINED FOR 6 WEEKS BEFORE ISSUING A FINAL REPORT     Performed at [facility]   Report Status PENDING   Incomplete  BODY FLUID CULTURE     Status: None   Collection Time    12/04/13  2:17 PM      Result Value Ref Range Status   Specimen Description FLUID PERICARDIAL   Final   Special Requests PATIENT ON FOLLOWING ZINACEF   Final   Gram Stain     Final   Value: WBC PRESENT,BOTH PMN AND MONONUCLEAR     NO ORGANISMS SEEN     Performed at [facility]   Culture     Final   Value: NO GROWTH 3 DAYS     Performed at [facility]   Report Status 12/07/2013 FINAL   Final   Studies/Results: No results found. Medications: I have reviewed the patient's current medications. Scheduled Meds: . acetaminophen  1,000 mg Oral 4 times per day   Or  . acetaminophen (TYLENOL) oral liquid 160 mg/5 mL  1,000 mg Oral 4 times per day  . amLODipine  5 mg Oral Daily  . antiseptic  oral rinse  15 mL Mouth Rinse q12n4p  . bisacodyl  10 mg Oral Daily  .  calcium carbonate  6 tablet Oral TID  . colchicine  0.3 mg Oral Daily  . vitamin B-12  500 mcg Oral QPM  . feeding supplement (ENSURE)  1 Container Oral TID BM  . FLUoxetine  10 mg Oral Daily  . folic acid  1 mg Oral Daily  . gabapentin  600 mg Oral TID  . loratadine  10 mg Oral Daily  . magnesium oxide  400 mg Oral TID PC & HS  . metoprolol tartrate  12.5 mg Oral BID  . multivitamin with minerals  1 tablet Oral Daily  . pantoprazole  40 mg Oral Daily  . predniSONE  20 mg Oral Q breakfast  . senna-docusate  1 tablet Oral QHS  . thiamine  100 mg Oral Daily  . Vitamin D (Ergocalciferol)  50,000 Units Oral Q7 days   Continuous Infusions: . sodium chloride 10 mL/hr (12/05/13 1014)   PRN Meds:.ondansetron (ZOFRAN) IV, oxyCODONE, sodium chloride Assessment/Plan: Ms. 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   1. Acute pericarditis with pericardial effusion s/p pericardial window 12/04/13: Etiology for pericarditis most likely inflammatory (pericardial fluid has high neutrophils, low lymphocytes and monocytes) vs. viral considering patient had acute bronchitis a few weeks ago. Concern for malignancy as possible etiology less likely since patient did not have lesions on CXR and has no hx of cancer (most common usually breast-normal mammo 2014) or lung- as above, neg CXR). Prior to surgery patient had mild chest pain, SOB, tachycardia in 110s-120s, JVD, friction rub, pulsus alternans on exam. EKG showed low voltage. A repeat echo was done on 12/04/2013 that showed the pericardial effusion persisting and possibly larger than before without tamponade. Window on 12/04/13. Cell count from the pericardial fluid showed WBC 1889, Neutrophils 85, Lymphs 12, Monocytes 3. Prelim AFB culture negative,  Prelim tissue culture NGTD just PMNs, cytology still pending, serum ANA + (1:320) Anti-dsDNA neg, quanitferon negative, lights criteria suggest exudative effusion (protien ratio 0.95).  - Cards/CVTS following--> Pt to have outpatient follow up in next few weeks. - Continue Colchicine 0.3mg  PO QD for 3 months (end date 02/28/14) this is renally dosed  - Continue Prednisone 20mg  QD for 2 weeks total (11/28/13-12/11/13) followed by tapered regimen  - Continue oxycodone PRN pain   2. Hyperkalemia 2/2 CKD: Patient's K improving 6.0-->5.2--> 4.9. Patient received dose of Kayexalate on 7/3. EKG showed no changes.  - Continue to monitor telemetry   3. Acute on chronic dCHF: Patient had been receiving serial echos for the pericardial effusion, which exacerbated her CHF. Patient is stable at this time after having pericardial window. CXR on 12/07/13 showed small bilateral pleural effusions.  - Continue to monitor clinically  - Continue to hold Lasix  - Continue daily weights and strict I&Os   4. Hypomagnesemia: Stable  - Mg 2.7 on 7/2  - Continue Mag Oxide 400mg  TID  - Will d/c on Mag Oxide 400mg  BID   5. 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 Cr has been higher this admission, remaining elevated above 2.0 >>3.07>>2.8>>2.54. Improving steadily.  - Continue to monitor  - Continue to hold Lasix   6. Anemia of chronic disease  - Hbg  8.9 on admission. Baseline is 8-10. Post-op Hgb 7.7>>8.2 will continue to monitor but hold off on transfusion unless becomes symptomatic.  - Associated with CKD and post-op blood loss  - Continue to monitor   7. HTN  - BP controlled, 130-140s/80-90s  - Continue amlodipine 5mg  PO QD   Diet: Heart healthy  DVT Prophy: SCDs  Dispo: Patient to be discharged today.   The patient does have a current PCP Otho Bellows, MD) and does need an [facility] hospital follow-up appointment after discharge.  The patient does not have transportation limitations that hinder  transportation to clinic appointments.  .Services Needed at time of discharge: Y = Yes, Blank = No PT:   OT:   RN:   Equipment:   Other:     LOS: 11 days   Albin Felling, MD 12/09/2013, 3:37 PM

## 2013-12-10 ENCOUNTER — Encounter: Payer: Self-pay | Admitting: Physician Assistant

## 2013-12-11 ENCOUNTER — Encounter: Payer: Self-pay | Admitting: Internal Medicine

## 2013-12-12 ENCOUNTER — Encounter: Payer: Self-pay | Admitting: Internal Medicine

## 2013-12-12 ENCOUNTER — Other Ambulatory Visit (HOSPITAL_COMMUNITY): Payer: Self-pay | Admitting: Internal Medicine

## 2013-12-12 ENCOUNTER — Ambulatory Visit (INDEPENDENT_AMBULATORY_CARE_PROVIDER_SITE_OTHER): Payer: PRIVATE HEALTH INSURANCE | Admitting: Internal Medicine

## 2013-12-12 VITALS — BP 144/95 | HR 90 | Temp 98.2°F | Ht 61.0 in | Wt 97.4 lb

## 2013-12-12 DIAGNOSIS — I309 Acute pericarditis, unspecified: Secondary | ICD-10-CM

## 2013-12-12 LAB — MISCELLANEOUS TEST

## 2013-12-12 MED ORDER — PREDNISONE 2.5 MG PO TABS: ORAL_TABLET | ORAL | Status: DC

## 2013-12-12 NOTE — Patient Instructions (Signed)
Stop taking the Prednisone 5 mg tablets that you have with you and pick up the new prescription that is sent to your [facility] pharmacy on [address]. Take Prednisone 2.5 mg tablets as instructed below.  Take 7 tablets once daily from 12/12/2013 - 12/26/2013 6 tablets once daily from 12/27/2013 - 01/10/2014 5 tablets once daily from 01/11/2014 - 02/25/2014 4 tablets once daily for 2 weeks 01/26/2014 - 02/09/2014 3 tablets once daily from 02/10/2014 - 02/24/2014 2 tablets once daily from 02/25/2014 - 03/11/2014 1 tablet once daily from 03/12/2014 - 03/26/2014, THEN STOP.

## 2013-12-12 NOTE — Progress Notes (Signed)
Subjective:   [name] female   DOB: 1955-10-22 58 y.o.   MRN: [id number]  HPI: [name] [name] is a 58 y.o. 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  Patient was admitted to the [facility] from 11/28/13 to 12/09/13 for Acute pericarditis/ Pericardial effusion. Patient underwent Subxiphoid pericardial window placement on 12/04/13 and the drainage tube was subsequently removed on 12/06/13. Patient was started on Prednisone taper and Colchicine and was discharged on 12/09/13 with a follow up in the [facility].  Patient reports that she is feeling "great". She denies any SOB, chest pain, or any other newer complaints. She complaints of soreness at the suture line. She denies any fever, chills, body pains. She reports that she was prescribed 25 tablets of prednisone but was told to continue prednisone for 3 more months. She was asking to clarify how to taper the prednisone. She reports compliance to her prednisone and colchicine.  She denies any other complaints.  Past Medical History  Diagnosis Date  . Anemia, B12 deficiency   . History of acute pancreatitis   . Right knee pain     No recent imaging on chart  . 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 Dr. Mancel Bale, [facility]  . Hypertriglyceridemia   . GERD (gastroesophageal reflux disease)   . Vitamin D deficiency   . Subdural hematoma 02/2008    Likely 2/2 trauma from seizure from EtOH withdrawal, chronic in nature, sees Dr. Jerene Bears. Most recent CT head 10/2009 showing stable but persistent hematoma without mass effect.  . History of seizure disorder     Likely 2/2 alcohol abuse  . Hypocalcemia   . Hypomagnesemia   . Failure to thrive in childhood     Unclear etiology  . HTN (hypertension)   . Thrombocytopenia   . Hepatomegaly     On exam  . Joint pain   . Alcohol abuse    . Vitamin D deficiency   . Pancreatitis   . Insomnia   . Hyperlipidemia   . Pernicious anemia   . Macrocytic anemia   . 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 Depression   . Fx humeral neck 04/17/2011    Transverse fracture- minimally displaced- managed as outpatient   . ABNORMAL PAP SMEAR, LGSIL 07/23/2008    Annotation: HPV positive CIN I Dr. Mancel Bale, [facility] Qualifier: Diagnosis of  By: Oretha Ellis    . Pneumonia 05/20/2012  . Arthritis     "shoulders" (08/15/2013)  . 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  Marland Kitchen Chronic diastolic CHF (congestive heart failure)   . Hypomagnesemia    Current Outpatient Prescriptions  Medication Sig Dispense Refill  . acetaminophen (TYLENOL) 500 MG tablet Take 1 tablet (500 mg total) by mouth every 4 (four) hours as needed for mild pain, moderate pain or headache.  30 tablet  0  . amLODipine (NORVASC) 5 MG tablet Take 1 tablet (5 mg total) by mouth daily.  30 tablet  3  . calcium carbonate (TUMS) 500 MG chewable tablet Chew 6 tablets (1,200 mg of elemental calcium total) by mouth 3 (three) times daily. For bone health  90 tablet  3  . cetirizine (ZYRTEC) 10 MG tablet Take 1 tablet (10 mg total) by  mouth daily.  90 tablet  3  . colchicine 0.6 MG tablet Take 0.5 tablets (0.3 mg total) by mouth daily.  40 tablet  0  . feeding supplement, ENSURE COMPLETE, (ENSURE COMPLETE) LIQD Take 237 mLs by mouth 3 (three) times daily between meals.  90 Bottle  1  . FLUoxetine (PROZAC) 10 MG capsule Take 1 capsule (10 mg total) by mouth daily. For depression  90 capsule  4  . folic acid (FOLVITE) 1 MG tablet Take 1 tablet (1 mg total) by mouth daily. For folic acid replacement  30 tablet  3  . furosemide (LASIX) 40 MG tablet Take 1 tablet (40 mg total) by mouth daily.  30 tablet  2  . gabapentin (NEURONTIN) 300 MG capsule Take 2 capsules (600 mg total) by mouth 3 (three) times daily. For anxiety/pain control   540 capsule  1  . lipase/protease/amylase (CREON-12/PANCREASE) 12000 UNITS CPEP capsule Take 1 capsule by mouth 3 (three) times daily before meals.  270 capsule  3  . magnesium oxide (MAG-OX) 400 MG tablet Take 1 tablet (400 mg total) by mouth 2 (two) times daily.  360 tablet  6  . metoprolol tartrate (LOPRESSOR) 12.5 mg TABS tablet Take 0.5 tablets (12.5 mg total) by mouth 2 (two) times daily.  60 tablet  2  . Multiple Vitamin (MULTIVITAMIN WITH MINERALS) TABS tablet Take 1 tablet by mouth daily. For vitamin replacement  90 tablet  3  . omeprazole (PRILOSEC) 40 MG capsule Take 1 capsule (40 mg total) by mouth daily.  90 capsule  4  . oxyCODONE (OXY IR/ROXICODONE) 5 MG immediate release tablet Take 1-2 tablets (5-10 mg total) by mouth every 4 (four) hours as needed for severe pain.  30 tablet  0  . predniSONE (DELTASONE) 5 MG tablet Take 2 tablets (10 mg total) by mouth daily with breakfast.  25 tablet  0  . senna-docusate (SENOKOT-S) 8.6-50 MG per tablet Take 1 tablet by mouth at bedtime.  30 tablet  1  . sodium bicarbonate 650 MG tablet Take 2 tablets (1,300 mg total) by mouth 2 (two) times daily.  60 tablet  0  . thiamine (VITAMIN B-1) 100 MG tablet Take 1 tablet (100 mg total) by mouth daily. For low thiamine  90 tablet  3  . vitamin B-12 (CYANOCOBALAMIN) 250 MCG tablet Take 1 tablet (250 mcg total) by mouth every evening.  90 tablet  3  . Vitamin D, Ergocalciferol, (DRISDOL) 50000 UNITS CAPS capsule Take 1 capsule (50,000 Units total) by mouth every 7 (seven) days. Thursdays: For bone health  4 capsule  3   No current facility-administered medications for this visit.   Family History  Problem Relation Age of Onset  . Cancer Mother     Died from stomach cancer and "flesh eating rash  . Heart failure Father     Died in 61s from an MI  . Alcohol abuse Sister     Twin sister drinks a lot, as did both her parents and brothers  . Stroke Brother     Has 7 brothers, 1 with CVA  . Lupus Mother     History   Social History  . Marital Status: Divorced    Spouse Name: N/A    Number of Children: N/A  . Years of Education: N/A   Social History Main Topics  . Smoking status: Former Smoker -- 0.50 packs/day for 40 years    Types: Cigarettes    Quit date: 09/20/2010  .  Smokeless tobacco: Never Used  . Alcohol Use: No  . Drug Use: No     Comment: 08/15/2013 "last drug use was in 2012"  . Sexual Activity: None   Other Topics Concern  . None   Social History Narrative   Lives with her significant other and 2 grandchildren. 1 child   Has 7 brothers and 4 sisters, 1 twin sister.   Unemployed, worked in [employer].    Abuses alcohol-drinks 1 glass of wine daily    No drug use. Former cigarette use quit 1.5 years ago.     11 th grade education            Review of Systems: Pertinent items are noted in HPI. Objective:  Physical Exam: Filed Vitals:   12/12/13 1318  BP: 144/95  Pulse: 90  Temp: 98.2 F (36.8 C)  TempSrc: Oral  Height: 5\' 1"  (1.549 m)  Weight: 97 lb 6.4 oz (44.18 kg)  SpO2: 99%   Constitutional: Vital signs reviewed.  Patient is a well-developed and well-nourished and is in no acute distress and cooperative with exam.  Neck: At 45 degrees inclination, slightly engorged external jugular noted bilaterally. Visible carotid artery pulsation noted in the neck bilaterally. A JVD of about 5 cm noted bilaterally.  Cardiovascular: Normal S1S2 heard. No pericardial rub or murmurs heard. Sub-xiphoitd suture line appears healthy without any sign of induration or surrounding cellulitis. Pulmonary/Chest: normal respiratory effort, CTAB, no wheezes, rales, or rhonchi Neurological: A&O x3  Skin: Warm, dry and intact.  Psychiatric: Normal mood and affect. speech and behavior is normal. Judgment and thought content normal. Cognition and memory are normal.  Assessment & Plan:

## 2013-12-15 NOTE — Progress Notes (Signed)
I saw and evaluated the patient.  I personally confirmed the key portions of the history and exam documented by Dr. Eyvonne Mechanic and I reviewed pertinent patient test results.  The assessment, diagnosis, and plan were formulated together and I agree with the documentation in the resident's note.

## 2013-12-15 NOTE — Assessment & Plan Note (Addendum)
Acute pericarditis with large pericardial effusion, s/p hospitalization from 11/28/13 to 12/09/13, s/p pericardial window placement. Currently on prednisone taper and colchicine therapy. Clinically doing well. Discussed with the attending regarding further management and plan.  Plans: As per discharging team recommendations, we will continue Prednisone taper and colchicine taper. To simplify the tapering regimen, Prednisone 2.5 mg tablets were sent to the pharmacy a total of 392 that are sufficient for the total duration of prednisone taper. She is to taper as follows. Take 7 tablets once daily from 12/12/2013 - 12/26/2013 6 tablets once daily from 12/27/2013 - 01/10/2014 5 tablets once daily from 01/11/2014 - 02/25/2014 4 tablets once daily for 2 weeks 01/26/2014 - 02/09/2014 3 tablets once daily from 02/10/2014 - 02/24/2014 2 tablets once daily from 02/25/2014 - 03/11/2014 1 tablet once daily from 03/12/2014 - 03/26/2014, THEN STOP. Clear instructions were to the patient and to the pharmacy regarding tapering. Recommended pharmacy to dispense one month supply of prednisone at a time.

## 2013-12-16 ENCOUNTER — Telehealth: Payer: Self-pay | Admitting: *Deleted

## 2013-12-16 NOTE — Telephone Encounter (Signed)
She will need an appt to eval the possible GI bleed. We should have openings tomorrow. To ED if bleeding worse tonight.

## 2013-12-16 NOTE — Telephone Encounter (Signed)
Call from [place], [place] - is seeing pt for the first time today. # [phone] Pt was in hospital from 6/26  -  7/7 for pericarditis/ Pericardial effusion.  Nurse states pt noted brown/red stool yesterday and dark black stool today. No SOB, dizziness, or abd pain.  Blood Pressure is 126/74   Edema to left ankle is noted.  Nurse will return to see pt on Thursday.  She advised pt to go to ED if bleeding was noted or she had abd pain. Pt seen in clinic on 7/10  Please advise

## 2013-12-17 ENCOUNTER — Telehealth: Payer: Self-pay | Admitting: *Deleted

## 2013-12-17 ENCOUNTER — Ambulatory Visit (INDEPENDENT_AMBULATORY_CARE_PROVIDER_SITE_OTHER): Payer: PRIVATE HEALTH INSURANCE | Admitting: Internal Medicine

## 2013-12-17 ENCOUNTER — Observation Stay (HOSPITAL_COMMUNITY): Payer: PRIVATE HEALTH INSURANCE

## 2013-12-17 ENCOUNTER — Observation Stay (HOSPITAL_COMMUNITY)
Admission: EM | Admit: 2013-12-17 | Discharge: 2013-12-18 | Disposition: A | Discharge status: Home / Self Care | Payer: PRIVATE HEALTH INSURANCE | Attending: Oncology | Admitting: Oncology

## 2013-12-17 ENCOUNTER — Encounter: Payer: Self-pay | Admitting: Nurse Practitioner

## 2013-12-17 ENCOUNTER — Ambulatory Visit (INDEPENDENT_AMBULATORY_CARE_PROVIDER_SITE_OTHER): Payer: PRIVATE HEALTH INSURANCE | Admitting: Nurse Practitioner

## 2013-12-17 ENCOUNTER — Encounter (HOSPITAL_COMMUNITY): Payer: Self-pay | Admitting: Emergency Medicine

## 2013-12-17 ENCOUNTER — Other Ambulatory Visit: Payer: Self-pay

## 2013-12-17 ENCOUNTER — Encounter: Payer: Self-pay | Admitting: Internal Medicine

## 2013-12-17 VITALS — BP 141/93 | HR 83 | Temp 97.9°F | Ht 61.0 in | Wt 99.6 lb

## 2013-12-17 VITALS — BP 160/100 | HR 80 | Ht 61.0 in | Wt 99.0 lb

## 2013-12-17 DIAGNOSIS — Z8701 Personal history of pneumonia (recurrent): Secondary | ICD-10-CM | POA: Insufficient documentation

## 2013-12-17 DIAGNOSIS — I3139 Other pericardial effusion (noninflammatory): Secondary | ICD-10-CM

## 2013-12-17 DIAGNOSIS — N179 Acute kidney failure, unspecified: Secondary | ICD-10-CM

## 2013-12-17 DIAGNOSIS — Z87891 Personal history of nicotine dependence: Secondary | ICD-10-CM | POA: Insufficient documentation

## 2013-12-17 DIAGNOSIS — M25512 Pain in left shoulder: Secondary | ICD-10-CM

## 2013-12-17 DIAGNOSIS — Z8719 Personal history of other diseases of the digestive system: Secondary | ICD-10-CM | POA: Diagnosis not present

## 2013-12-17 DIAGNOSIS — N183 Chronic kidney disease, stage 3 unspecified: Secondary | ICD-10-CM

## 2013-12-17 DIAGNOSIS — E875 Hyperkalemia: Secondary | ICD-10-CM

## 2013-12-17 DIAGNOSIS — I129 Hypertensive chronic kidney disease with stage 1 through stage 4 chronic kidney disease, or unspecified chronic kidney disease: Secondary | ICD-10-CM | POA: Insufficient documentation

## 2013-12-17 DIAGNOSIS — K921 Melena: Secondary | ICD-10-CM

## 2013-12-17 DIAGNOSIS — G40909 Epilepsy, unspecified, not intractable, without status epilepticus: Secondary | ICD-10-CM | POA: Insufficient documentation

## 2013-12-17 DIAGNOSIS — Z8611 Personal history of tuberculosis: Secondary | ICD-10-CM | POA: Insufficient documentation

## 2013-12-17 DIAGNOSIS — Z8781 Personal history of (healed) traumatic fracture: Secondary | ICD-10-CM | POA: Diagnosis not present

## 2013-12-17 DIAGNOSIS — I5032 Chronic diastolic (congestive) heart failure: Secondary | ICD-10-CM | POA: Diagnosis not present

## 2013-12-17 DIAGNOSIS — F3289 Other specified depressive episodes: Secondary | ICD-10-CM | POA: Insufficient documentation

## 2013-12-17 DIAGNOSIS — F329 Major depressive disorder, single episode, unspecified: Secondary | ICD-10-CM | POA: Insufficient documentation

## 2013-12-17 DIAGNOSIS — K861 Other chronic pancreatitis: Secondary | ICD-10-CM

## 2013-12-17 DIAGNOSIS — K219 Gastro-esophageal reflux disease without esophagitis: Secondary | ICD-10-CM | POA: Insufficient documentation

## 2013-12-17 DIAGNOSIS — IMO0002 Reserved for concepts with insufficient information to code with codable children: Secondary | ICD-10-CM | POA: Diagnosis not present

## 2013-12-17 DIAGNOSIS — Z79899 Other long term (current) drug therapy: Secondary | ICD-10-CM | POA: Insufficient documentation

## 2013-12-17 DIAGNOSIS — I319 Disease of pericardium, unspecified: Secondary | ICD-10-CM

## 2013-12-17 DIAGNOSIS — I509 Heart failure, unspecified: Secondary | ICD-10-CM

## 2013-12-17 DIAGNOSIS — N184 Chronic kidney disease, stage 4 (severe): Secondary | ICD-10-CM

## 2013-12-17 DIAGNOSIS — Z862 Personal history of diseases of the blood and blood-forming organs and certain disorders involving the immune mechanism: Secondary | ICD-10-CM | POA: Diagnosis not present

## 2013-12-17 DIAGNOSIS — E785 Hyperlipidemia, unspecified: Secondary | ICD-10-CM | POA: Insufficient documentation

## 2013-12-17 DIAGNOSIS — I309 Acute pericarditis, unspecified: Secondary | ICD-10-CM

## 2013-12-17 DIAGNOSIS — D638 Anemia in other chronic diseases classified elsewhere: Secondary | ICD-10-CM

## 2013-12-17 DIAGNOSIS — Z8739 Personal history of other diseases of the musculoskeletal system and connective tissue: Secondary | ICD-10-CM | POA: Insufficient documentation

## 2013-12-17 DIAGNOSIS — I313 Pericardial effusion (noninflammatory): Secondary | ICD-10-CM

## 2013-12-17 LAB — CBC WITH DIFFERENTIAL/PLATELET
BASOS ABS: 0 10*3/uL (ref 0.0–0.1)
Basophils Relative: 0 % (ref 0–1)
Eosinophils Absolute: 0 10*3/uL (ref 0.0–0.7)
Eosinophils Relative: 0 % (ref 0–5)
HCT: 30.3 % — ABNORMAL LOW (ref 36.0–46.0)
Hemoglobin: 9 g/dL — ABNORMAL LOW (ref 12.0–15.0)
Lymphocytes Relative: 12 % (ref 12–46)
Lymphs Abs: 0.9 10*3/uL (ref 0.7–4.0)
MCH: 30.1 pg (ref 26.0–34.0)
MCHC: 29.7 g/dL — ABNORMAL LOW (ref 30.0–36.0)
MCV: 101.3 fL — ABNORMAL HIGH (ref 78.0–100.0)
MONO ABS: 0.2 10*3/uL (ref 0.1–1.0)
Monocytes Relative: 2 % — ABNORMAL LOW (ref 3–12)
NEUTROS ABS: 6.6 10*3/uL (ref 1.7–7.7)
Neutrophils Relative %: 86 % — ABNORMAL HIGH (ref 43–77)
Platelets: 421 10*3/uL — ABNORMAL HIGH (ref 150–400)
RBC: 2.99 MIL/uL — ABNORMAL LOW (ref 3.87–5.11)
RDW: 15.3 % (ref 11.5–15.5)
WBC: 7.7 10*3/uL (ref 4.0–10.5)

## 2013-12-17 LAB — MAGNESIUM: Magnesium: 1.5 mg/dL (ref 1.5–2.5)

## 2013-12-17 LAB — BASIC METABOLIC PANEL
ANION GAP: 20 — AB (ref 5–15)
BUN: 43 mg/dL — AB (ref 6–23)
CHLORIDE: 102 meq/L (ref 96–112)
CO2: 18 mEq/L — ABNORMAL LOW (ref 19–32)
CREATININE: 3.24 mg/dL — AB (ref 0.50–1.10)
Calcium: 6.3 mg/dL — CL (ref 8.4–10.5)
GFR, EST AFRICAN AMERICAN: 17 mL/min — AB (ref >90)
GFR, EST NON AFRICAN AMERICAN: 15 mL/min — AB (ref >90)
Glucose, Bld: 163 mg/dL — ABNORMAL HIGH (ref 70–99)
Potassium: 5.9 mEq/L — ABNORMAL HIGH (ref 3.7–5.3)
Sodium: 140 mEq/L (ref 137–147)

## 2013-12-17 LAB — BASIC METABOLIC PANEL WITH GFR
BUN: 44 mg/dL — ABNORMAL HIGH (ref 6–23)
CO2: 18 mEq/L — ABNORMAL LOW (ref 19–32)
CREATININE: 3.23 mg/dL — AB (ref 0.50–1.10)
Calcium: 6.5 mg/dL — ABNORMAL LOW (ref 8.4–10.5)
Chloride: 98 mEq/L (ref 96–112)
GFR, Est African American: 18 mL/min — ABNORMAL LOW
GFR, Est Non African American: 15 mL/min — ABNORMAL LOW
Glucose, Bld: 258 mg/dL — ABNORMAL HIGH (ref 70–99)
POTASSIUM: 6 meq/L — AB (ref 3.5–5.3)
Sodium: 135 mEq/L (ref 135–145)

## 2013-12-17 LAB — CBC
HEMATOCRIT: 28.7 % — AB (ref 36.0–46.0)
Hemoglobin: 8.8 g/dL — ABNORMAL LOW (ref 12.0–15.0)
MCH: 30.6 pg (ref 26.0–34.0)
MCHC: 30.7 g/dL (ref 30.0–36.0)
MCV: 99.7 fL (ref 78.0–100.0)
PLATELETS: 419 10*3/uL — AB (ref 150–400)
RBC: 2.88 MIL/uL — ABNORMAL LOW (ref 3.87–5.11)
RDW: 15.4 % (ref 11.5–15.5)
WBC: 7.2 10*3/uL (ref 4.0–10.5)

## 2013-12-17 LAB — POC HEMOCCULT BLD/STL (OFFICE/1-CARD/DIAGNOSTIC): OCCULT BLOOD DATE: NEGATIVE

## 2013-12-17 LAB — GLUCOSE, CAPILLARY: GLUCOSE-CAPILLARY: 132 mg/dL — AB (ref 70–99)

## 2013-12-17 MED ORDER — SODIUM CHLORIDE 0.9 % IV SOLN
1.0000 g | Freq: Once | INTRAVENOUS | Status: AC
  Administered 2013-12-18: 1 g via INTRAVENOUS
  Filled 2013-12-17: qty 10

## 2013-12-17 MED ORDER — ENSURE COMPLETE PO LIQD
237.0000 mL | Freq: Three times a day (TID) | ORAL | Status: DC
  Administered 2013-12-17 – 2013-12-18 (×3): 237 mL via ORAL

## 2013-12-17 MED ORDER — VITAMIN D (ERGOCALCIFEROL) 1.25 MG (50000 UNIT) PO CAPS
50000.0000 [IU] | ORAL_CAPSULE | ORAL | Status: DC
  Administered 2013-12-18: 50000 [IU] via ORAL
  Filled 2013-12-17: qty 1

## 2013-12-17 MED ORDER — AMLODIPINE BESYLATE 5 MG PO TABS
5.0000 mg | ORAL_TABLET | Freq: Every day | ORAL | Status: DC
  Administered 2013-12-18: 5 mg via ORAL
  Filled 2013-12-17: qty 1

## 2013-12-17 MED ORDER — CALCIUM CARBONATE ANTACID 500 MG PO CHEW
6.0000 | CHEWABLE_TABLET | Freq: Three times a day (TID) | ORAL | Status: DC
  Administered 2013-12-17 – 2013-12-18 (×2): 1200 mg via ORAL
  Filled 2013-12-17 (×4): qty 6

## 2013-12-17 MED ORDER — SODIUM CHLORIDE 0.9 % IV BOLUS (SEPSIS)
1000.0000 mL | Freq: Once | INTRAVENOUS | Status: AC
  Administered 2013-12-17: 1000 mL via INTRAVENOUS

## 2013-12-17 MED ORDER — FOLIC ACID 1 MG PO TABS
1.0000 mg | ORAL_TABLET | Freq: Every day | ORAL | Status: DC
  Administered 2013-12-18: 1 mg via ORAL
  Filled 2013-12-17: qty 1

## 2013-12-17 MED ORDER — SODIUM CHLORIDE 0.9 % IJ SOLN
3.0000 mL | Freq: Two times a day (BID) | INTRAMUSCULAR | Status: DC
  Administered 2013-12-17 – 2013-12-18 (×2): 3 mL via INTRAVENOUS

## 2013-12-17 MED ORDER — METOPROLOL TARTRATE 12.5 MG HALF TABLET
12.5000 mg | ORAL_TABLET | Freq: Two times a day (BID) | ORAL | Status: DC
  Administered 2013-12-17 – 2013-12-18 (×2): 12.5 mg via ORAL
  Filled 2013-12-17 (×3): qty 1

## 2013-12-17 MED ORDER — GABAPENTIN 600 MG PO TABS
600.0000 mg | ORAL_TABLET | Freq: Three times a day (TID) | ORAL | Status: DC
  Administered 2013-12-17 – 2013-12-18 (×2): 600 mg via ORAL
  Filled 2013-12-17 (×4): qty 1

## 2013-12-17 MED ORDER — CYANOCOBALAMIN 250 MCG PO TABS
250.0000 ug | ORAL_TABLET | Freq: Every evening | ORAL | Status: DC
  Administered 2013-12-17: 250 ug via ORAL
  Filled 2013-12-17 (×2): qty 1

## 2013-12-17 MED ORDER — SODIUM BICARBONATE 650 MG PO TABS
1300.0000 mg | ORAL_TABLET | Freq: Two times a day (BID) | ORAL | Status: DC
  Administered 2013-12-17 – 2013-12-18 (×2): 1300 mg via ORAL
  Filled 2013-12-17 (×3): qty 2

## 2013-12-17 MED ORDER — HEPARIN SODIUM (PORCINE) 5000 UNIT/ML IJ SOLN
5000.0000 [IU] | Freq: Three times a day (TID) | INTRAMUSCULAR | Status: DC
  Administered 2013-12-17 – 2013-12-18 (×3): 5000 [IU] via SUBCUTANEOUS
  Filled 2013-12-17 (×3): qty 1

## 2013-12-17 MED ORDER — FUROSEMIDE 40 MG PO TABS
40.0000 mg | ORAL_TABLET | Freq: Every day | ORAL | Status: DC
  Administered 2013-12-18: 40 mg via ORAL
  Filled 2013-12-17: qty 1

## 2013-12-17 MED ORDER — DEXTROSE 50 % IV SOLN
1.0000 | Freq: Once | INTRAVENOUS | Status: AC
  Administered 2013-12-17: 50 mL via INTRAVENOUS
  Filled 2013-12-17: qty 50

## 2013-12-17 MED ORDER — PREDNISONE 5 MG PO TABS
17.5000 mg | ORAL_TABLET | Freq: Every day | ORAL | Status: DC
  Administered 2013-12-18: 17.5 mg via ORAL
  Filled 2013-12-17 (×2): qty 1

## 2013-12-17 MED ORDER — VITAMIN B-1 100 MG PO TABS
100.0000 mg | ORAL_TABLET | Freq: Every day | ORAL | Status: DC
  Administered 2013-12-18: 100 mg via ORAL
  Filled 2013-12-17: qty 1

## 2013-12-17 MED ORDER — PANTOPRAZOLE SODIUM 40 MG PO TBEC
40.0000 mg | DELAYED_RELEASE_TABLET | Freq: Every day | ORAL | Status: DC
  Administered 2013-12-17 – 2013-12-18 (×2): 40 mg via ORAL
  Filled 2013-12-17 (×2): qty 1

## 2013-12-17 MED ORDER — MAGNESIUM OXIDE 400 MG PO TABS
400.0000 mg | ORAL_TABLET | Freq: Two times a day (BID) | ORAL | Status: DC
  Administered 2013-12-17 – 2013-12-18 (×2): 400 mg via ORAL
  Filled 2013-12-17 (×3): qty 1

## 2013-12-17 MED ORDER — ADULT MULTIVITAMIN W/MINERALS CH
1.0000 | ORAL_TABLET | Freq: Every day | ORAL | Status: DC
  Administered 2013-12-18: 1 via ORAL
  Filled 2013-12-17: qty 1

## 2013-12-17 MED ORDER — COLCHICINE 0.6 MG PO TABS
0.3000 mg | ORAL_TABLET | Freq: Every day | ORAL | Status: DC
  Administered 2013-12-18: 0.3 mg via ORAL
  Filled 2013-12-17: qty 0.5

## 2013-12-17 MED ORDER — FLUOXETINE HCL 10 MG PO CAPS
10.0000 mg | ORAL_CAPSULE | Freq: Every day | ORAL | Status: DC
  Administered 2013-12-18: 10 mg via ORAL
  Filled 2013-12-17: qty 1

## 2013-12-17 MED ORDER — INSULIN ASPART 100 UNIT/ML IV SOLN
10.0000 [IU] | Freq: Once | INTRAVENOUS | Status: AC
  Administered 2013-12-17: 10 [IU] via INTRAVENOUS
  Filled 2013-12-17: qty 0.1

## 2013-12-17 MED ORDER — PANCRELIPASE (LIP-PROT-AMYL) 12000-38000 UNITS PO CPEP
1.0000 | ORAL_CAPSULE | Freq: Three times a day (TID) | ORAL | Status: DC
  Administered 2013-12-18 (×2): 1 via ORAL
  Filled 2013-12-17 (×4): qty 1

## 2013-12-17 NOTE — Assessment & Plan Note (Addendum)
Noted 7/13h/o gastric and duodenal AVMS could be etiology. Also h/o int/external hemorrhoids Stat CBC, BMET, Mag, fobt was negative, orthostatics today neg and pt HD stable (BP 137/88 HR 83 lying, BP 132/89 HR 83 sitting, BP 141/93 HR 83 standing) Continue PPI qd, Avoid NSAIDS, she was d/c off Aspirin Will refer to Dr. Benson Norway (GI) for f/u visit

## 2013-12-17 NOTE — ED Notes (Addendum)
Pt doctor who sent her to ED called and stated pt's potassium was 6.0 in her office and no interventions were done at that time. Also states pt has renal hx.

## 2013-12-17 NOTE — ED Provider Notes (Signed)
CSN: [id number]     Arrival date & time 12/17/13  1643 History   First MD Initiated Contact with Patient 12/17/13 1732     Chief Complaint  Patient presents with  . hyperkalemia      (Consider location/radiation/quality/duration/timing/severity/associated sxs/prior Treatment) HPI 59 year old female with a history of multiple medical problems (see list below) who presents today as started by her cardiologist for hyperkalemia. Patient states that she is having no acute symptoms at this time. She denies any palpitations, chest pain, shortness of breath, abdominal pain, weakness, numbness, and extremity swelling.  Past Medical History  Diagnosis Date  . Anemia, B12 deficiency   . History of acute pancreatitis   . Right knee pain     No recent imaging on chart  . 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 Dr. Mancel Bale, [facility]  . Hypertriglyceridemia   . GERD (gastroesophageal reflux disease)   . Vitamin D deficiency   . Subdural hematoma 02/2008    Likely 2/2 trauma from seizure from EtOH withdrawal, chronic in nature, sees Dr. Jerene Bears. Most recent CT head 10/2009 showing stable but persistent hematoma without mass effect.  . History of seizure disorder     Likely 2/2 alcohol abuse  . Hypocalcemia   . Hypomagnesemia   . Failure to thrive in childhood     Unclear etiology  . HTN (hypertension)   . Thrombocytopenia   . Hepatomegaly     On exam  . Joint pain   . Alcohol abuse   . Vitamin D deficiency   . Pancreatitis   . Insomnia   . Hyperlipidemia   . Pernicious anemia   . Macrocytic anemia   . 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 Depression   . Fx humeral neck 04/17/2011    Transverse fracture- minimally displaced- managed as outpatient   . ABNORMAL PAP SMEAR, LGSIL 07/23/2008    Annotation: HPV positive CIN I Dr. Mancel Bale, [facility] Qualifier: Diagnosis of  By: Oretha Ellis    .  Pneumonia 05/20/2012  . Arthritis     "shoulders" (08/15/2013)  . 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  Marland Kitchen Chronic diastolic CHF (congestive heart failure)   . Hypomagnesemia    Past Surgical History  Procedure Laterality Date  . Cesarean section  1983  . Esophagogastroduodenoscopy  07/11/2011    Procedure: ESOPHAGOGASTRODUODENOSCOPY (EGD);  Surgeon: Beryle Beams, MD;  Location: [facility];  Service: Endoscopy;  Laterality: N/A;  . Colonoscopy  07/11/2011    Procedure: COLONOSCOPY;  Surgeon: Beryle Beams, MD;  Location: [facility] ENDOSCOPY;  Service: Endoscopy;  Laterality: N/A;  . Eye surgery Left     "trauma"  . Right colectomy  08/28/2011  . Esophagogastroduodenoscopy N/A 12/01/2012    Procedure: ESOPHAGOGASTRODUODENOSCOPY (EGD);  Surgeon: Irene Shipper, MD;  Location: [facility];  Service: Endoscopy;  Laterality: N/A;  . Colonoscopy with esophagogastroduodenoscopy (egd) Left 08/21/2013    Procedure: COLONOSCOPY WITH ESOPHAGOGASTRODUODENOSCOPY (EGD);  Surgeon: Beryle Beams, MD;  Location: [facility] ENDOSCOPY;  Service: Endoscopy;  Laterality: Left;  . Subxyphoid pericardial window N/A 12/04/2013    Procedure: SUBXYPHOID PERICARDIAL WINDOW WITH TEE;  Surgeon: Ivin Poot, MD;  Location: [facility];  Service: Thoracic;  Laterality: N/A;  . Intraoperative transesophageal echocardiogram N/A 12/04/2013    Procedure: INTRAOPERATIVE TRANSESOPHAGEAL ECHOCARDIOGRAM;  Surgeon: Ivin Poot, MD;  Location: [facility] OR;  Service: Open Heart Surgery;  Laterality: N/A;   Family History  Problem Relation Age of Onset  . Cancer Mother     Died from stomach cancer and "flesh eating rash  . Heart failure Father     Died in 18s from an MI  . Alcohol abuse Sister     Twin sister drinks a lot, as did both her parents and brothers  . Stroke Brother     Has 7 brothers, 1 with CVA  . Lupus Mother    History  Substance Use Topics  . Smoking status: Former Smoker -- 0.50 packs/day  for 40 years    Types: Cigarettes    Quit date: 09/20/2010  . Smokeless tobacco: Never Used  . Alcohol Use: No   OB History   Grav Para Term Preterm Abortions TAB SAB Ect Mult Living                 Review of Systems  Constitutional: Negative for fever and chills.  HENT: Negative for sore throat.   Eyes: Negative for pain.  Respiratory: Negative for cough and shortness of breath.   Cardiovascular: Negative for chest pain.  Gastrointestinal: Negative for nausea, vomiting, abdominal pain and diarrhea.  Genitourinary: Negative for dysuria.  Musculoskeletal: Negative for back pain.  Skin: Negative for rash.  Neurological: Negative for numbness and headaches.      Allergies  Amitriptyline hcl and Doxycycline hyclate  Home Medications   Prior to Admission medications   Medication Sig Start Date End Date Taking? Authorizing Provider  acetaminophen (TYLENOL) 500 MG tablet Take 1 tablet (500 mg total) by mouth every 4 (four) hours as needed for mild pain, moderate pain or headache. 11/29/13  Yes Jessee Avers, MD  amLODipine (NORVASC) 5 MG tablet Take 1 tablet (5 mg total) by mouth daily. 11/28/13 11/28/14 Yes Otho Bellows, MD  calcium carbonate (TUMS) 500 MG chewable tablet Chew 6 tablets (1,200 mg of elemental calcium total) by mouth 3 (three) times daily. For bone health 11/11/12  Yes Encarnacion Slates, NP  cetirizine (ZYRTEC) 10 MG tablet Take 10 mg by mouth daily as needed for allergies.   Yes Historical Provider, MD  colchicine 0.6 MG tablet Take 0.5 tablets (0.3 mg total) by mouth daily. 12/09/13 02/28/14 Yes Clinton Gallant, MD  feeding supplement, ENSURE COMPLETE, (ENSURE COMPLETE) LIQD Take 237 mLs by mouth 3 (three) times daily between meals. 08/27/13  Yes Valaria Good, MD  FLUoxetine (PROZAC) 10 MG capsule Take 1 capsule (10 mg total) by mouth daily. For depression 02/11/13  Yes Otho Bellows, MD  folic acid (FOLVITE) 1 MG tablet Take 1 tablet (1 mg total) by mouth daily. For folic  acid replacement 11/06/13  Yes Sid Falcon, MD  furosemide (LASIX) 40 MG tablet Take 1 tablet (40 mg total) by mouth daily. 11/26/13  Yes Corky Sox, MD  gabapentin (NEURONTIN) 300 MG capsule Take 2 capsules (600 mg total) by mouth 3 (three) times daily. For anxiety/pain control 07/02/13  Yes Otho Bellows, MD  lipase/protease/amylase (CREON-12/PANCREASE) 12000 UNITS CPEP capsule Take 1 capsule by mouth 3 (three) times daily before meals. 04/10/13  Yes Otho Bellows, MD  magnesium oxide (MAG-OX) 400 MG tablet Take 400 mg by mouth 4 (four) times daily.   Yes Historical Provider, MD  metoprolol tartrate (LOPRESSOR) 25 MG tablet Take 12.5 mg by mouth 2 (two) times daily.   Yes Historical Provider, MD  Multiple Vitamin (MULTIVITAMIN  WITH MINERALS) TABS tablet Take 1 tablet by mouth daily. For vitamin replacement 04/10/13  Yes Otho Bellows, MD  omeprazole (PRILOSEC) 40 MG capsule Take 1 capsule (40 mg total) by mouth daily. 02/11/13  Yes Otho Bellows, MD  predniSONE (DELTASONE) 2.5 MG tablet Take 17.5 mg by mouth daily with breakfast. For 7 days 12/16/13 12/23/13 Yes Historical Provider, MD  sodium bicarbonate 650 MG tablet Take 650 mg by mouth daily.   Yes Historical Provider, MD  thiamine (VITAMIN B-1) 100 MG tablet Take 1 tablet (100 mg total) by mouth daily. For low thiamine   Yes Otho Bellows, MD  vitamin B-12 (CYANOCOBALAMIN) 250 MCG tablet Take 1 tablet (250 mcg total) by mouth every evening. 04/10/13  Yes Otho Bellows, MD  Vitamin D, Ergocalciferol, (DRISDOL) 50000 UNITS CAPS capsule Take 1 capsule (50,000 Units total) by mouth every 7 (seven) days. Thursdays: For bone health 12/04/13  Yes Otho Bellows, MD   BP 147/86  Pulse 79  Temp(Src) 97.9 F (36.6 C) (Oral)  Resp 15  Wt 99 lb (44.906 kg)  SpO2 100% Physical Exam  Constitutional: She is oriented to person, place, and time. She appears well-developed and well-nourished. No distress.  HENT:  Head: Normocephalic and atraumatic.   Eyes: Pupils are equal, round, and reactive to light. Right eye exhibits no discharge. Left eye exhibits no discharge.  Neck: Normal range of motion.  Cardiovascular: Normal rate, regular rhythm and normal heart sounds.   Pulmonary/Chest: Effort normal and breath sounds normal.  Abdominal: Soft. She exhibits no distension. There is no tenderness.  Musculoskeletal: Normal range of motion.  Neurological: She is alert and oriented to person, place, and time.  Skin: Skin is warm. She is not diaphoretic.    ED Course  Procedures (including critical care time) Labs Review Labs Reviewed  CBC - Abnormal; Notable for the following:    RBC 2.88 (*)    Hemoglobin 8.8 (*)    HCT 28.7 (*)    Platelets 419 (*)    All other components within normal limits  BASIC METABOLIC PANEL - Abnormal; Notable for the following:    Potassium 5.9 (*)    CO2 18 (*)    Glucose, Bld 163 (*)    BUN 43 (*)    Creatinine, Ser 3.24 (*)    Calcium 6.3 (*)    GFR calc non Af Amer 15 (*)    GFR calc Af Amer 17 (*)    Anion gap 20 (*)    All other components within normal limits  GLUCOSE, CAPILLARY - Abnormal; Notable for the following:    Glucose-Capillary 132 (*)    All other components within normal limits  CBC  RENAL FUNCTION PANEL  MAGNESIUM    Imaging Review No results found.   EKG Interpretation   Date/Time:  Wednesday December 17 2013 16:56:02 EDT Ventricular Rate:  84 PR Interval:  172 QRS Duration: 66 QT Interval:  440 QTC Calculation: 519 R Axis:   102 Text Interpretation:  Normal sinus rhythm Slow R Progression-no change No  Hyper K+ changes. Prolonged QT Abnormal ECG Confirmed by Jeneen Rinks  MD, [place]  [phone]) on 12/17/2013 5:21:48 PM      MDM   Final diagnoses:  Hyperkalemia  AKI (acute kidney injury)   58 year old female with a history of recent pericarditis and multiple medical problems as listed above presents today as recommended by her primary care physician and her cardiologist for  hyperkalemia.  Arrival the  patient is assessed and is hemodynamically stable. She appears in no acute distress. She has a normal heart rate and a normal blood pressure. Her physical exam is normal this time. Patient has no acute complaints. Her outpatient labs are checked and demonstrates potassium of 6.0 and an increased creatinine. Plan is to recheck these lab results. Your  Patient's BMP demonstrates potassium of 5.9 as well as a creatinine of 3.24. Given the patient's recent admission and hospitalization and given these worsening lab abnormalities, plan is to consult with the internal medicine admitting team. Teaching service agrees to admit the patient for management of her hyperkalemia and acute kidney injury. Patient was admitted to the floor in stable condition. Patient was seen and evaluated by myself and by the attending Dr. Jeneen Rinks.  Freddi Che, MD 12/18/13 [phone]

## 2013-12-17 NOTE — Progress Notes (Signed)
[name] [name] Date of Birth: [birth year] Medical Record #[id number]  History of Present Illness: [name] [name] was scheduled to see me in the office today. She has had recent subxiphoid pericardial window for a large effusion on 12/04/13.   She has been seen by her PCP earlier today. Labs were drawn there. Received phone call from her primary MD to do an EKG and send to the ER. She has marked lab abnormality noted.   Current Outpatient Prescriptions  Medication Sig Dispense Refill  . acetaminophen (TYLENOL) 500 MG tablet Take 1 tablet (500 mg total) by mouth every 4 (four) hours as needed for mild pain, moderate pain or headache.  30 tablet  0  . amLODipine (NORVASC) 5 MG tablet Take 1 tablet (5 mg total) by mouth daily.  30 tablet  3  . calcium carbonate (TUMS) 500 MG chewable tablet Chew 6 tablets (1,200 mg of elemental calcium total) by mouth 3 (three) times daily. For bone health  90 tablet  3  . cetirizine (ZYRTEC) 10 MG tablet Take 1 tablet (10 mg total) by mouth daily.  90 tablet  3  . colchicine 0.6 MG tablet Take 0.5 tablets (0.3 mg total) by mouth daily.  40 tablet  0  . feeding supplement, ENSURE COMPLETE, (ENSURE COMPLETE) LIQD Take 237 mLs by mouth 3 (three) times daily between meals.  90 Bottle  1  . FLUoxetine (PROZAC) 10 MG capsule Take 1 capsule (10 mg total) by mouth daily. For depression  90 capsule  4  . folic acid (FOLVITE) 1 MG tablet Take 1 tablet (1 mg total) by mouth daily. For folic acid replacement  30 tablet  3  . furosemide (LASIX) 40 MG tablet Take 1 tablet (40 mg total) by mouth daily.  30 tablet  2  . gabapentin (NEURONTIN) 300 MG capsule Take 2 capsules (600 mg total) by mouth 3 (three) times daily. For anxiety/pain control  540 capsule  1  . lipase/protease/amylase (CREON-12/PANCREASE) 12000 UNITS CPEP capsule Take 1 capsule by mouth 3 (three) times daily before meals.  270 capsule  3  . magnesium oxide (MAG-OX) 400 MG tablet Take 1 tablet (400 mg total) by mouth 2  (two) times daily.  360 tablet  6  . metoprolol tartrate (LOPRESSOR) 12.5 mg TABS tablet Take 0.5 tablets (12.5 mg total) by mouth 2 (two) times daily.  60 tablet  2  . Multiple Vitamin (MULTIVITAMIN WITH MINERALS) TABS tablet Take 1 tablet by mouth daily. For vitamin replacement  90 tablet  3  . omeprazole (PRILOSEC) 40 MG capsule Take 1 capsule (40 mg total) by mouth daily.  90 capsule  4  . oxyCODONE (OXY IR/ROXICODONE) 5 MG immediate release tablet Take 1-2 tablets (5-10 mg total) by mouth every 4 (four) hours as needed for severe pain.  30 tablet  0  . predniSONE (DELTASONE) 2.5 MG tablet Take 7 tablets once daily from 12/12/2013 - 12/26/2013, then cut down one tablet every two weeks as directed until you stop.  392 tablet  0  . senna-docusate (SENOKOT-S) 8.6-50 MG per tablet Take 1 tablet by mouth at bedtime.  30 tablet  1  . sodium bicarbonate 650 MG tablet Take 2 tablets (1,300 mg total) by mouth 2 (two) times daily.  60 tablet  0  . thiamine (VITAMIN B-1) 100 MG tablet Take 1 tablet (100 mg total) by mouth daily. For low thiamine  90 tablet  3  . vitamin B-12 (CYANOCOBALAMIN) 250 MCG tablet Take 1  tablet (250 mcg total) by mouth every evening.  90 tablet  3  . Vitamin D, Ergocalciferol, (DRISDOL) 50000 UNITS CAPS capsule Take 1 capsule (50,000 Units total) by mouth every 7 (seven) days. Thursdays: For bone health  4 capsule  3   No current facility-administered medications for this visit.    Allergies  Allergen Reactions  . Amitriptyline Hcl Swelling    In the face.  . Doxycycline Hyclate Itching    Feels like something crawling under her skin    Past Medical History  Diagnosis Date  . Anemia, B12 deficiency   . History of acute pancreatitis   . Right knee pain     No recent imaging on chart  . 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 Dr. Mancel Bale, [facility]  . Hypertriglyceridemia   . GERD (gastroesophageal  reflux disease)   . Vitamin D deficiency   . Subdural hematoma 02/2008    Likely 2/2 trauma from seizure from EtOH withdrawal, chronic in nature, sees Dr. Jerene Bears. Most recent CT head 10/2009 showing stable but persistent hematoma without mass effect.  . History of seizure disorder     Likely 2/2 alcohol abuse  . Hypocalcemia   . Hypomagnesemia   . Failure to thrive in childhood     Unclear etiology  . HTN (hypertension)   . Thrombocytopenia   . Hepatomegaly     On exam  . Joint pain   . Alcohol abuse   . Vitamin D deficiency   . Pancreatitis   . Insomnia   . Hyperlipidemia   . Pernicious anemia   . Macrocytic anemia   . 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 Depression   . Fx humeral neck 04/17/2011    Transverse fracture- minimally displaced- managed as outpatient   . ABNORMAL PAP SMEAR, LGSIL 07/23/2008    Annotation: HPV positive CIN I Dr. Mancel Bale, [facility] Qualifier: Diagnosis of  By: Oretha Ellis    . Pneumonia 05/20/2012  . Arthritis     "shoulders" (08/15/2013)  . 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  Marland Kitchen Chronic diastolic CHF (congestive heart failure)   . Hypomagnesemia     Past Surgical History  Procedure Laterality Date  . Cesarean section  1983  . Esophagogastroduodenoscopy  07/11/2011    Procedure: ESOPHAGOGASTRODUODENOSCOPY (EGD);  Surgeon: Beryle Beams, MD;  Location: [facility];  Service: Endoscopy;  Laterality: N/A;  . Colonoscopy  07/11/2011    Procedure: COLONOSCOPY;  Surgeon: Beryle Beams, MD;  Location: [facility] ENDOSCOPY;  Service: Endoscopy;  Laterality: N/A;  . Eye surgery Left     "trauma"  . Right colectomy  08/28/2011  . Esophagogastroduodenoscopy N/A 12/01/2012    Procedure: ESOPHAGOGASTRODUODENOSCOPY (EGD);  Surgeon: Irene Shipper, MD;  Location: [facility];  Service: Endoscopy;  Laterality: N/A;  . Colonoscopy with esophagogastroduodenoscopy (egd) Left 08/21/2013    Procedure:  COLONOSCOPY WITH ESOPHAGOGASTRODUODENOSCOPY (EGD);  Surgeon: Beryle Beams, MD;  Location: [facility] ENDOSCOPY;  Service: Endoscopy;  Laterality: Left;  . Subxyphoid pericardial window N/A 12/04/2013    Procedure: SUBXYPHOID PERICARDIAL WINDOW WITH TEE;  Surgeon: Ivin Poot, MD;  Location: [place];  Service: Thoracic;  Laterality: N/A;  . Intraoperative transesophageal echocardiogram N/A 12/04/2013    Procedure: INTRAOPERATIVE TRANSESOPHAGEAL ECHOCARDIOGRAM;  Surgeon: Ivin Poot, MD;  Location: [place];  Service: Open  Heart Surgery;  Laterality: N/A;    History  Smoking status  . Former Smoker -- 0.50 packs/day for 40 years  . Types: Cigarettes  . Quit date: 09/20/2010  Smokeless tobacco  . Never Used    History  Alcohol Use No    Family History  Problem Relation Age of Onset  . Cancer Mother     Died from stomach cancer and "flesh eating rash  . Heart failure Father     Died in 47s from an MI  . Alcohol abuse Sister     Twin sister drinks a lot, as did both her parents and brothers  . Stroke Brother     Has 7 brothers, 1 with CVA  . Lupus Mother     Review of Systems: The review of systems is per the HPI.  All other systems were reviewed and are negative.  Physical Exam: BP 160/100  Pulse 80  Ht 5\' 1"  (1.549 m)  Wt 99 lb (44.906 kg)  BMI 18.72 kg/m2 She was not otherwise examined.    Wt Readings from Last 3 Encounters:  12/17/13 99 lb (44.906 kg)  12/17/13 99 lb 9.6 oz (45.178 kg)  12/12/13 97 lb 6.4 oz (44.18 kg)    LABORATORY DATA/PROCEDURES:  Lab Results  Component Value Date   WBC 7.7 12/17/2013   HGB 9.0* 12/17/2013   HCT 30.3* 12/17/2013   PLT 421* 12/17/2013   GLUCOSE 258* 12/17/2013   CHOL 99 12/04/2012   TRIG 301* 03/26/2012   HDL 30* 03/26/2012   LDLCALC 20 03/26/2012   ALT 15 12/06/2013   AST 25 12/06/2013   NA 135 12/17/2013   K 6.0* 12/17/2013   CL 98 12/17/2013   CREATININE 3.23* 12/17/2013   BUN 44* 12/17/2013   CO2 18* 12/17/2013   TSH 0.897  12/02/2013   INR 1.06 12/04/2013   HGBA1C 5.4 11/28/2013   Lab Results  Component Value Date   CALCIUM 6.5* 12/17/2013   PHOS 3.0 11/29/2013     BNP (last 3 results)  Recent Labs  11/28/13 2050  PROBNP 17837.0*     Assessment / Plan: 1. Recent subxiphoid pericardial window for large effusion - for echo later this week.   2. Marked lab abnormality noted  3. AKI on CKD  4. Significant low calcium level.  Her EKG here in the office today shows sinus with diffuse T wave changes. She has prolonged QT at 452 with QTc of 511.  I have sent her to the ER at [facility] for further evaluation. Our team will be available as needed.   Patient is agreeable to this plan and is headed on to the [facility] ER.  Burtis Junes, RN, [facility] [address] [place], [place]  [id number] [phone]

## 2013-12-17 NOTE — ED Notes (Addendum)
Lab called with critical value, calcium of 6.3. Resident, [name] [name] notified.

## 2013-12-17 NOTE — Telephone Encounter (Signed)
S/w someone at pt's household pt will come in at 3:00 pm today

## 2013-12-17 NOTE — Progress Notes (Signed)
   Subjective:    [name], female    DOB: 1955/10/27, 58 y.o.   MRN: [id number]  HPI Comments: 58 y.o extensive PMH FSGS with CKD 4 (creatinine rising >2.0 over months), anemia of chronic disease (BL hbg ~8), recent pericarditis with moderate tamponade s/p subxiphoid pericardial window 7/2 with drainage and removal of drain 7/4 currently on steroid taper for 3 months  She presents for: 1. H/H RN and pt noticed red/brown/back stool x 1 episode on 7/13.  She has not noticed any more blood in her stools since.  08/2013 colonoscopy/endoscopy with internal/external hemorrhoids with diverticula (by Dr. Benson Norway) and friable gastric AVM and duodenal AVM. Orthostatics negative.    2. Pericarditis-she is on 7 tablets daily and had trouble getting Rx as it was sent to wrong pharmacy.  She is still taking colchicine. She has f/u with cards today and repeat echo Friday   3. She c/o left shoulder radiating to left neck sharp intermittent pain 8/10 last occurred last  Night.  She rubbed it to help and did not try oxycodone.  Nothing makes worse.  This is the same pain since she was recently in the hospital for pericarditis it is not worse it is bearable.  Review of imaging 11/2012 with fracture of left humerus.         Review of Systems  Respiratory: Negative for shortness of breath.   Cardiovascular: Negative for chest pain.  Gastrointestinal: Positive for blood in stool. Negative for abdominal pain.  Neurological: Positive for light-headedness.       Slightly lightheaded with bending and standing       Objective:   Physical Exam  Nursing note and vitals reviewed. Constitutional: She is oriented to person, place, and time. Vital signs are normal. She appears well-developed and well-nourished. She is cooperative. No distress.  HENT:  Head: Normocephalic and atraumatic.  Mouth/Throat: Oropharynx is clear and moist and mucous membranes are normal. No oropharyngeal exudate.  Eyes:  Conjunctivae are normal. Pupils are equal, round, and reactive to light. Right eye exhibits no discharge. Left eye exhibits no discharge. No scleral icterus.  Neck: JVD present.  Right JVD  Cardiovascular: Normal rate and regular rhythm.  Exam reveals friction rub.   No murmur heard. Min. friction rub  Pulmonary/Chest: Effort normal and breath sounds normal. No respiratory distress. She has no wheezes.  Abdominal: Soft. Bowel sounds are normal. There is no tenderness.  Genitourinary: Rectal exam shows external hemorrhoid.  Musculoskeletal:       Left shoulder: She exhibits no tenderness.  Neurological: She is alert and oriented to person, place, and time. Gait normal.  Skin: Skin is warm and dry. No rash noted. She is not diaphoretic.     Psychiatric: She has a normal mood and affect. Her speech is normal and behavior is normal. Judgment and thought content normal. Cognition and memory are normal.          Assessment & Plan:  F/u in 2-4 weeks

## 2013-12-17 NOTE — ED Notes (Signed)
Pt states PCP sent her to ED due to high potassium.  States she feels fine.

## 2013-12-17 NOTE — Patient Instructions (Signed)
I am sending you to the hospital

## 2013-12-17 NOTE — Assessment & Plan Note (Signed)
Creatinine 3.23 with stat labs today with K 6.0 CO2 18 Called Truitt Merle who pt had appt with today at 3 PM and alerted for pt to go to the ED for AKI, hyperkalemia asked cards to do EKG while in the office.

## 2013-12-17 NOTE — Assessment & Plan Note (Signed)
Continue steroid taper currently on 7 tablets daily and colchicine  She will have f/u with cardiology today and repeat echo Friday

## 2013-12-17 NOTE — Telephone Encounter (Signed)
Pt scheduled for 1:15 today

## 2013-12-17 NOTE — Assessment & Plan Note (Signed)
H/o left should fracture could be etiology of referred pain to neck Try heat, prn narcotics prn  Consider Sports Medicine referral in future if not improving

## 2013-12-17 NOTE — H&P (Signed)
Date: 12/17/2013               Patient Name:  [name] [name] MRN: [id number]  DOB: 1956-04-30 Age / Sex: 58 y.o., female   PCP: Otho Bellows, MD         Medical Service: Internal Medicine Teaching Service         Attending Physician: Dr. Annia Belt, MD    First Contact: Dr. Arcelia Jew Pager: [phone]  Second Contact: Dr. Algis Liming Pager: [phone]       After Hours (After 5p/  First Contact Pager: [phone]  weekends / holidays): Second Contact Pager: [phone]   Chief Complaint: hyperkalemia  History of Present Illness: [name] [name] is a 58 y.o. F with PMHx of chronic diastolic CHF, CKD stage III, pernicious anemia, HTN and history of pancreatitis who presents to the ED from her cardiologist for hyperkalemia. Dr. Aundra Dubin called from the Internal Medicine Clinic when her labs came back showing hyperkalemia with a potassium of 6.0. The cardiologist office then informed [name] [name] of her abnormal labs and sent her to the ED. [name] [name] has no complaints on exam today. She states her urine output has been adequate and she does not feel like she is retaining fluid. She denies any fever, chills, dizziness, weakness, chest pain, heart palpitations, shortness of breath, nausea or paresthesias.   Meds: Current Facility-Administered Medications  Medication Dose Route Frequency Provider Last Rate Last Dose  . [START ON 12/18/2013] amLODipine (NORVASC) tablet 5 mg  5 mg Oral Daily Corky Sox, MD      . calcium carbonate (TUMS - dosed in mg elemental calcium) chewable tablet 1,200 mg of elemental calcium  6 tablet Oral TID Corky Sox, MD   1,200 mg of elemental calcium at 12/17/13 2236  . calcium gluconate 1 g in sodium chloride 0.9 % 100 mL IVPB  1 g Intravenous Once Corky Sox, MD      . Derrill Memo ON 12/18/2013] colchicine tablet 0.3 mg  0.3 mg Oral Daily Corky Sox, MD      . feeding supplement (ENSURE COMPLETE) (ENSURE COMPLETE) liquid 237 mL  237 mL Oral TID BM Corky Sox, MD   237 mL at  12/17/13 2240  . [START ON 12/18/2013] FLUoxetine (PROZAC) capsule 10 mg  10 mg Oral Daily Corky Sox, MD      . Derrill Memo ON 02/18/9449] folic acid (FOLVITE) tablet 1 mg  1 mg Oral Daily Corky Sox, MD      . Derrill Memo ON 12/18/2013] furosemide (LASIX) tablet 40 mg  40 mg Oral Daily Corky Sox, MD      . gabapentin (NEURONTIN) tablet 600 mg  600 mg Oral TID Corky Sox, MD   600 mg at 12/17/13 2236  . heparin injection 5,000 Units  5,000 Units Subcutaneous 3 times per day Corky Sox, MD   5,000 Units at 12/17/13 2246  . [START ON 12/18/2013] lipase/protease/amylase (CREON-12/PANCREASE) capsule 1 capsule  1 capsule Oral TID AC Corky Sox, MD      . magnesium oxide (MAG-OX) tablet 400 mg  400 mg Oral BID Corky Sox, MD   400 mg at 12/17/13 2236  . metoprolol tartrate (LOPRESSOR) tablet 12.5 mg  12.5 mg Oral BID Corky Sox, MD   12.5 mg at 12/17/13 2236  . [START ON 12/18/2013] multivitamin with minerals tablet 1 tablet  1 tablet Oral Daily Corky Sox, MD      .  pantoprazole (PROTONIX) EC tablet 40 mg  40 mg Oral Daily Corky Sox, MD   40 mg at 12/17/13 2236  . [START ON 12/18/2013] predniSONE (DELTASONE) tablet 17.5 mg  17.5 mg Oral Q breakfast Corky Sox, MD      . sodium bicarbonate tablet 1,300 mg  1,300 mg Oral BID Corky Sox, MD   1,300 mg at 12/17/13 2237  . sodium chloride 0.9 % injection 3 mL  3 mL Intravenous Q12H Corky Sox, MD   3 mL at 12/17/13 2246  . [START ON 12/18/2013] thiamine (VITAMIN B-1) tablet 100 mg  100 mg Oral Daily Corky Sox, MD      . vitamin B-12 (CYANOCOBALAMIN) tablet 250 mcg  250 mcg Oral QPM Corky Sox, MD   250 mcg at 12/17/13 2236  . [START ON 12/18/2013] Vitamin D (Ergocalciferol) (DRISDOL) capsule 50,000 Units  50,000 Units Oral Q Thu Corky Sox, MD        Allergies: Allergies as of 12/17/2013 - Review Complete 12/17/2013  Allergen Reaction Noted  . Amitriptyline hcl Swelling   . Doxycycline hyclate Itching 11/10/2010   Past Medical  History  Diagnosis Date  . Anemia, B12 deficiency   . History of acute pancreatitis   . Right knee pain     No recent imaging on chart  . 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 Dr. Mancel Bale, [facility]  . Hypertriglyceridemia   . GERD (gastroesophageal reflux disease)   . Vitamin D deficiency   . Subdural hematoma 02/2008    Likely 2/2 trauma from seizure from EtOH withdrawal, chronic in nature, sees Dr. Jerene Bears. Most recent CT head 10/2009 showing stable but persistent hematoma without mass effect.  . History of seizure disorder     Likely 2/2 alcohol abuse  . Hypocalcemia   . Hypomagnesemia   . Failure to thrive in childhood     Unclear etiology  . HTN (hypertension)   . Thrombocytopenia   . Hepatomegaly     On exam  . Joint pain   . Alcohol abuse   . Vitamin D deficiency   . Pancreatitis   . Insomnia   . Hyperlipidemia   . Pernicious anemia   . Macrocytic anemia   . 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 Depression   . Fx humeral neck 04/17/2011    Transverse fracture- minimally displaced- managed as outpatient   . ABNORMAL PAP SMEAR, LGSIL 07/23/2008    Annotation: HPV positive CIN I Dr. Mancel Bale, [facility] Qualifier: Diagnosis of  By: Oretha Ellis    . Pneumonia 05/20/2012  . Arthritis     "shoulders" (08/15/2013)  . 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  Marland Kitchen Chronic diastolic CHF (congestive heart failure)   . Hypomagnesemia    Past Surgical History  Procedure Laterality Date  . Cesarean section  1983  . Esophagogastroduodenoscopy  07/11/2011    Procedure: ESOPHAGOGASTRODUODENOSCOPY (EGD);  Surgeon: Beryle Beams, MD;  Location: [facility];  Service: Endoscopy;  Laterality: N/A;  . Colonoscopy  07/11/2011    Procedure: COLONOSCOPY;  Surgeon: Beryle Beams, MD;  Location: [facility];  Service: Endoscopy;  Laterality: N/A;  . Eye surgery  Left     "trauma"  . Right colectomy  08/28/2011  . Esophagogastroduodenoscopy N/A 12/01/2012    Procedure: ESOPHAGOGASTRODUODENOSCOPY (  EGD);  Surgeon: Irene Shipper, MD;  Location: [facility];  Service: Endoscopy;  Laterality: N/A;  . Colonoscopy with esophagogastroduodenoscopy (egd) Left 08/21/2013    Procedure: COLONOSCOPY WITH ESOPHAGOGASTRODUODENOSCOPY (EGD);  Surgeon: Beryle Beams, MD;  Location: [facility];  Service: Endoscopy;  Laterality: Left;  . Subxyphoid pericardial window N/A 12/04/2013    Procedure: SUBXYPHOID PERICARDIAL WINDOW WITH TEE;  Surgeon: Ivin Poot, MD;  Location: [place];  Service: Thoracic;  Laterality: N/A;  . Intraoperative transesophageal echocardiogram N/A 12/04/2013    Procedure: INTRAOPERATIVE TRANSESOPHAGEAL ECHOCARDIOGRAM;  Surgeon: Ivin Poot, MD;  Location: [place];  Service: Open Heart Surgery;  Laterality: N/A;   Family History  Problem Relation Age of Onset  . Cancer Mother     Died from stomach cancer and "flesh eating rash  . Heart failure Father     Died in 84s from an MI  . Alcohol abuse Sister     Twin sister drinks a lot, as did both her parents and brothers  . Stroke Brother     Has 7 brothers, 1 with CVA  . Lupus Mother    History   Social History  . Marital Status: Divorced    Spouse Name: N/A    Number of Children: N/A  . Years of Education: N/A   Occupational History  . Not on file.   Social History Main Topics  . Smoking status: Former Smoker -- 0.50 packs/day for 40 years    Types: Cigarettes    Quit date: 09/20/2010  . Smokeless tobacco: Never Used  . Alcohol Use: No  . Drug Use: No     Comment: 08/15/2013 "last drug use was in 2012"  . Sexual Activity: Not on file   Other Topics Concern  . Not on file   Social History Narrative   Lives with her significant other and 2 grandchildren. 1 child   Has 7 brothers and 4 sisters, 1 twin sister.   Unemployed, worked in [employer].    Abuses alcohol-drinks 1 glass of  wine daily    No drug use. Former cigarette use quit 1.5 years ago.     11 th grade education             Review of Systems: General: Denies weakness. Denies fever, chills, diaphoresis, appetite change and fatigue.  Respiratory: Denies SOB, DOE, cough, chest tightness, and wheezing.   Cardiovascular: Denies chest pain and palpitations.  Gastrointestinal: Denies nausea, vomiting, abdominal pain, diarrhea, constipation, blood in stool and abdominal distention.  Genitourinary: Denies dysuria, urgency, frequency, hematuria, and flank pain. Endocrine: Denies hot or cold intolerance, polyuria, and polydipsia. Musculoskeletal: Denies myalgias, back pain, joint swelling, arthralgias and gait problem.  Skin: Denies pallor, rash and wounds.  Neurological: Denies paresthesias. Denies dizziness, seizures, syncope, weakness, lightheadedness, numbness and headaches.  Psychiatric/Behavioral: Denies mood changes, confusion, nervousness, sleep disturbance and agitation.  Physical Exam: Filed Vitals:   12/17/13 1845 12/17/13 1930 12/17/13 2000 12/17/13 2047  BP: 141/84 140/88 131/86 147/86  Pulse: 81 79 78 79  Temp:    97.9 F (36.6 C)  TempSrc:    Oral  Resp: 14 15 13 15   Weight:    99 lb (44.906 kg)  SpO2: 100% 98% 100% 100%   General: Vital signs reviewed.  Patient is a well-developed and well-nourished, in no acute distress and cooperative with exam.  Head: Normocephalic and atraumatic. Eyes: PERRL, EOMI, conjunctivae normal, No scleral icterus.  Neck: Supple, trachea midline, normal ROM, No  JVD, masses, thyromegaly, or no carotid bruit present.  Cardiovascular: RRR, S1 normal, S2 normal, no murmurs, gallops, or rubs. Pulmonary/Chest: Mild crackles in lower lung fields, no wheezes, or rhonchi. Incisional wounds from pericardial window are clean, dry and intact. Abdominal: Soft, non-tender, non-distended, BS +, no masses, organomegaly, or guarding present.  Musculoskeletal: No joint  deformities, erythema, or stiffness, ROM full and nontender. Extremities: Negative lower extremity edema bilaterally,  pulses symmetric and intact bilaterally. No cyanosis or clubbing. Neurological: A&O x3, Strength is normal and symmetric bilaterally, cranial nerve II-XII are grossly intact, no focal motor deficit, sensory intact to light touch bilaterally.  Skin: Warm, dry and intact. No rashes or erythema. Psychiatric: Normal mood and affect. speech and behavior is normal. Cognition and memory are normal.   Lab results: Basic Metabolic Panel:  Recent Labs  12/17/13 1356 12/17/13 1713  NA 135 140  K 6.0* 5.9*  CL 98 102  CO2 18* 18*  GLUCOSE 258* 163*  BUN 44* 43*  CREATININE 3.23* 3.24*  CALCIUM 6.5* 6.3*  MG 1.5  --    CBC:  Recent Labs  12/17/13 1356 12/17/13 1713  WBC 7.7 7.2  NEUTROABS 6.6  --   HGB 9.0* 8.8*  HCT 30.3* 28.7*  MCV 101.3* 99.7  PLT 421* 419*    Other results: EKG: normal EKG, normal sinus rhythm, unchanged from previous tracings. No peaked T waves.   Assessment & Plan by Problem:  Hyperkalemia: Pt was found to have hyperkalemia from labs drawn in the clinic. Potassium of 6.0. Upon admission at the ER, potassium found to be 5.9. No complaints of weakness, nausea, paresthesias, or palpitations. EKG was NSR, no peaked T waves. Pt was given insulin 10 units, 1 amp of D50 and a NS 1 L bolus in the ED.  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 More recent baseline is 2-2.5. Possibly from over diuresis or fluid shift due to prednisone.  -Increased sodium bicarb to 1,300 mg daily -Renal diet with 1200 mL fluid restriction -Daily weights and I/Os -On vitamin D 50,000 units once a week  H/o Pericarditis with pericardial effusion s/p pericardial window 12/04/13: No chest pain, SOB. No pericardial friction rub or JVD on exam. On  colchicine 0.6 mg daily at home and prednisone 17.5 mg daily until 12/26/13 as part of steroid tapered regimen.  -Colchicine 0.3 mg daily -Prednisone 17.5 mg daily  Hypocalcemia: Likely 2/2 chronic renal failure. Calcium 6.3 on admission. Baseline is 7. Denies cramps or paresthesias. -Calcium Carbonate 1,200 mg TID  Hypomagnesia: Mg 1.5 on admission, this is about her baseline.  -Magnesium oxide 400 mg BID  HTN: BP controlled, 130-140s/80-90s. On metoprolol tartrate 12.5 mg BID and amlodipine 5 mg daily at home. -Metoprolol tartrate 12.5 mg BID -Amlodipine 5 mg daily  Chronic Diastolic Congestive Heart Failure: Pt denies chest pain, shortness of breath, or lower extremity edema. No LEE b/l on exam. On furosemide 40 mg daily and metoprolol 12.5 mg amlodipine 5 mg daily at home.  -Lasix 40 mg daily -Metoprolol tartrate 12.5 mg BID -Amlodipine 5 mg daily -Renal diet with 1200 mL fluid restriction -Daily weights and I/Os  Anemia of Chronic Disease: Hgb 8.8 on admission. Most likely 2/2 to CKD, history of pernicious anemia, folate and vitamin B 12 deficiency. Baseline is 8-10. Will continue to monitor but hold off on transfusion unless  becomes symptomatic.  -Monitor -Folic acid 1 mg daily -Vitamin B12 250 mcg daily  Chronic Pancreatitis: Denies epigastric pain, nausea or vomiting.  -lipase/protease/amylase   DVT/PE ppx: Heaprin 5,000 units Q8H  Dispo: Disposition is deferred at this time, awaiting improvement of current medical problems. Anticipated discharge in approximately 1-2 day(s).   The patient does have a current PCP Otho Bellows, MD) and does not need an [facility] hospital follow-up appointment after discharge.  The patient does not have transportation limitations that hinder transportation to clinic appointments.  Signed: Osa Craver, DO PGY-1 Internal Medicine Resident Pager # [phone] 12/17/2013 11:23 PM

## 2013-12-17 NOTE — Patient Instructions (Addendum)
General Instructions: Please follow up in 2-4 weeks if needed  We will refer you to Dr. Benson Norway   Treatment Goals:  Goals (1 Years of Data) as of 12/17/13         As of Today As of Today As of Today As of Today 12/12/13     Blood Pressure    . Blood Pressure < 140/90  141/93 132/89 137/88 134/83 144/95     Lifestyle    . Prevent Falls          . Reduce alcohol intake to X servings per day           Result Component    . HDL > 40          . LDL CALC < 130            Progress Toward Treatment Goals:  Treatment Goal 12/17/2013  Blood pressure at goal  Prevent falls -    Self Care Goals & Plans:  Self Care Goal 12/17/2013  Manage my medications take my medicines as prescribed; bring my medications to every visit; refill my medications on time  Monitor my health keep track of my blood pressure  Eat healthy foods drink diet soda or water instead of juice or soda; eat more vegetables; eat foods that are low in salt; eat baked foods instead of fried foods; eat fruit for snacks and desserts  Be physically active -    No flowsheet data found.   Care Management & Community Referrals:  Referral 12/17/2013  Referrals made for care management support -  Referrals made to community resources none       Bloody Stools Bloody stools often mean that there is a problem in the digestive tract. Your caregiver may use the term "melena" to describe black, tarry, and bad smelling stools or "hematochezia" to describe red or maroon-colored stools. Blood seen in the stool can be caused by bleeding anywhere along the intestinal tract.  A black stool usually means that blood is coming from the upper part of the gastrointestinal tract (esophagus, stomach, or small bowel). Passing maroon-colored stools or bright red blood usually means that blood is coming from lower down in the large bowel or the rectum. However, sometimes massive bleeding in the stomach or small intestine can cause bright red bloody  stools.  Consuming black licorice, lead, iron pills, medicines containing bismuth subsalicylate, or blueberries can also cause black stools. Your caregiver can test black stools to see if blood is present. It is important that the cause of the bleeding be found. Treatment can then be started, and the problem can be corrected. Rectal bleeding may not be serious, but you should not assume everything is okay until you know the cause.It is very important to follow up with your caregiver or a specialist in gastrointestinal problems. CAUSES  Blood in the stools can come from various underlying causes.Often, the cause is not found during your first visit. Testing is often needed to discover the cause of bleeding in the gastrointestinal tract. Causes range from simple to serious or even life-threatening.Possible causes include:  Hemorrhoids.These are veins that are full of blood (engorged) in the rectum. They cause pain, inflammation, and may bleed.  Anal fissures.These are areas of painful tearing which may bleed. They are often caused by passing hard stool.  Diverticulosis.These are pouches that form on the colon over time, with age, and may bleed significantly.  Diverticulitis.This is inflammation in areas with diverticulosis. It can cause  pain, fever, and bloody stools, although bleeding is rare.  Proctitis and colitis. These are inflamed areas of the rectum or colon. They may cause pain, fever, and bloody stools.  Polyps and cancer. Colon cancer is a leading cause of preventable cancer death.It often starts out as precancerous polyps that can be removed during a colonoscopy, preventing progression into cancer. Sometimes, polyps and cancer may cause rectal bleeding.  Gastritis and ulcers.Bleeding from the upper gastrointestinal tract (near the stomach) may travel through the intestines and produce black, sometimes tarry, often bad smelling stools. In certain cases, if the bleeding is fast  enough, the stools may not be black, but red and the condition may be life-threatening. SYMPTOMS  You may have stools that are bright red and bloody, that are normal color with blood on them, or that are dark black and tarry. In some cases, you may only have blood in the toilet bowl. Any of these cases need medical care. You may also have:  Pain at the anus or anywhere in the rectum.  Lightheadedness or feeling faint.  Extreme weakness.  Nausea or vomiting.  Fever. DIAGNOSIS Your caregiver may use the following methods to find the cause of your bleeding:  Taking a medical history. Age is important. Older people tend to develop polyps and cancer more often. If there is anal pain and a hard, large stool associated with bleeding, a tear of the anus may be the cause. If blood drips into the toilet after a bowel movement, bleeding hemorrhoids may be the problem. The color and frequency of the bleeding are additional considerations. In most cases, the medical history provides clues, but seldom the final answer.  A visual and finger (digital) exam. Your caregiver will inspect the anal area, looking for tears and hemorrhoids. A finger exam can provide information when there is tenderness or a growth inside. In men, the prostate is also examined.  Endoscopy. Several types of small, long scopes (endoscopes) are used to view the colon.  In the office, your caregiver may use a rigid, or more commonly, a flexible viewing sigmoidoscope. This exam is called flexible sigmoidoscopy. It is performed in 5 to 10 minutes.  A more thorough exam is accomplished with a colonoscope. It allows your caregiver to view the entire 5 to 6 foot long colon. Medicine to help you relax (sedative) is usually given for this exam. Frequently, a bleeding lesion may be present beyond the reach of the sigmoidoscope. So, a colonoscopy may be the best exam to start with. Both exams are usually done on an outpatient basis. This means  the patient does not stay overnight in the hospital or surgery center.  An upper endoscopy may be needed to examine your stomach. Sedation is used and a flexible endoscope is put in your mouth, down to your stomach.  A barium enema X-ray. This is an X-ray exam. It uses liquid barium inserted by enema into the rectum. This test alone may not identify an actual bleeding point. X-rays highlight abnormal shadows, such as those made by lumps (tumors), diverticuli, or colitis. TREATMENT  Treatment depends on the cause of your bleeding.   For bleeding from the stomach or colon, the caregiver doing your endoscopy or colonoscopy may be able to stop the bleeding as part of the procedure.  Inflammation or infection of the colon can be treated with medicines.  Many rectal problems can be treated with creams, suppositories, or warm baths.  Surgery is sometimes needed.  Blood transfusions are  sometimes needed if you have lost a lot of blood.  For any bleeding problem, let your caregiver know if you take aspirin or other blood thinners regularly. HOME CARE INSTRUCTIONS   Take any medicines exactly as prescribed.  Keep your stools soft by eating a diet high in fiber. Prunes (1 to 3 a day) work well for many people.  Drink enough water and fluids to keep your urine clear or pale yellow.  Take sitz baths if advised. A sitz bath is when you sit in a bathtub with warm water for 10 to 15 minutes to soak, soothe, and cleanse the rectal area.  If enemas or suppositories are advised, be sure you know how to use them. Tell your caregiver if you have problems with this.  Monitor your bowel movements to look for signs of improvement or worsening. SEEK MEDICAL CARE IF:   You do not improve in the time expected.  Your condition worsens after initial improvement.  You develop any new symptoms. SEEK IMMEDIATE MEDICAL CARE IF:   You develop severe or prolonged rectal bleeding.  You vomit blood.  You  feel weak or faint.  You have a fever. MAKE SURE YOU:  Understand these instructions.  Will watch your condition.  Will get help right away if you are not doing well or get worse. Document Released: 05/12/2002 Document Revised: 08/14/2011 Document Reviewed: 10/07/2010 [facility] Patient Information 2015 [place], [place]. This information is not intended to replace advice given to you by your health care provider. Make sure you discuss any questions you have with your health care provider.

## 2013-12-18 DIAGNOSIS — G40909 Epilepsy, unspecified, not intractable, without status epilepticus: Secondary | ICD-10-CM | POA: Diagnosis not present

## 2013-12-18 DIAGNOSIS — E875 Hyperkalemia: Secondary | ICD-10-CM | POA: Diagnosis not present

## 2013-12-18 DIAGNOSIS — K219 Gastro-esophageal reflux disease without esophagitis: Secondary | ICD-10-CM | POA: Diagnosis not present

## 2013-12-18 DIAGNOSIS — N179 Acute kidney failure, unspecified: Secondary | ICD-10-CM | POA: Diagnosis not present

## 2013-12-18 LAB — CBC
HCT: 29 % — ABNORMAL LOW (ref 36.0–46.0)
Hemoglobin: 8.9 g/dL — ABNORMAL LOW (ref 12.0–15.0)
MCH: 30.4 pg (ref 26.0–34.0)
MCHC: 30.7 g/dL (ref 30.0–36.0)
MCV: 99 fL (ref 78.0–100.0)
PLATELETS: 332 10*3/uL (ref 150–400)
RBC: 2.93 MIL/uL — AB (ref 3.87–5.11)
RDW: 15.5 % (ref 11.5–15.5)
WBC: 7.5 10*3/uL (ref 4.0–10.5)

## 2013-12-18 LAB — RENAL FUNCTION PANEL
ALBUMIN: 2.6 g/dL — AB (ref 3.5–5.2)
ANION GAP: 18 — AB (ref 5–15)
BUN: 42 mg/dL — ABNORMAL HIGH (ref 6–23)
CHLORIDE: 106 meq/L (ref 96–112)
CO2: 20 mEq/L (ref 19–32)
Calcium: 6.7 mg/dL — ABNORMAL LOW (ref 8.4–10.5)
Creatinine, Ser: 2.89 mg/dL — ABNORMAL HIGH (ref 0.50–1.10)
GFR, EST AFRICAN AMERICAN: 20 mL/min — AB (ref >90)
GFR, EST NON AFRICAN AMERICAN: 17 mL/min — AB (ref >90)
Glucose, Bld: 75 mg/dL (ref 70–99)
POTASSIUM: 5 meq/L (ref 3.7–5.3)
Phosphorus: 4.7 mg/dL — ABNORMAL HIGH (ref 2.3–4.6)
SODIUM: 144 meq/L (ref 137–147)

## 2013-12-18 LAB — MAGNESIUM: Magnesium: 1.5 mg/dL (ref 1.5–2.5)

## 2013-12-18 MED ORDER — FUROSEMIDE 20 MG PO TABS: 20.0000 mg | ORAL_TABLET | Freq: Every day | ORAL | Status: DC

## 2013-12-18 MED ORDER — FUROSEMIDE 40 MG PO TABS: 20.0000 mg | ORAL_TABLET | Freq: Every day | ORAL | Status: DC

## 2013-12-18 NOTE — Progress Notes (Signed)
Utilization review completed.  

## 2013-12-18 NOTE — Progress Notes (Signed)
Case discussed with Dr. McLean at the time of the visit.  We reviewed the resident's history and exam and pertinent patient test results.  I agree with the assessment, diagnosis, and plan of care documented in the resident's note.     

## 2013-12-18 NOTE — Discharge Summary (Signed)
Attending physician note: I personally examined this patient on the day of discharge. Active problems, medical management, and discharge planning, accurate as recorded above by resident physician Dr. Albin Felling. This is a 23 hour observation admission to monitor potassium in a lady who presented with hyperkalemia and who has known chronic kidney disease. She is discharged in good condition. Potassium following treatment yesterday and now 5.0. She will followup in our internal medicine clinic.

## 2013-12-18 NOTE — Progress Notes (Signed)
Patient to be discharged to home. Discharge instructions reviewed with patient. PIV x2 removed. Tele box #8 removed and returned to nurse's station.   Kaylean Tupou Eileen,RN.

## 2013-12-18 NOTE — Discharge Instructions (Signed)
It was a pleasure taking care of you, [name] [name]. Your Lasix medication has been decreased to 20mg  daily instead of 40mg . Please make sure you are taking Lasix 20mg  daily.

## 2013-12-18 NOTE — Discharge Summary (Signed)
Name: [name] [name] MRN: [id number] DOB: 1955/08/13 58 y.o. PCP: Otho Bellows, MD  Date of Admission: 12/17/2013  5:08 PM Date of Discharge: 12/18/2013 Attending Physician: No att. providers found  Discharge Diagnosis: 1. Hyperkalemia- resolved 2. 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 Hypocalcemia 5. Hypomagnesemia 6. HTN 7. Chronic Diastolic CHF 8. Anemia of Chronic Disease  Discharge Medications:   Medication List         acetaminophen 500 MG tablet  Commonly known as:  TYLENOL  Take 1 tablet (500 mg total) by mouth every 4 (four) hours as needed for mild pain, moderate pain or headache.     amLODipine 5 MG tablet  Commonly known as:  NORVASC  Take 1 tablet (5 mg total) by mouth daily.     calcium carbonate 500 MG chewable tablet  Commonly known as:  TUMS  Chew 6 tablets (1,200 mg of elemental calcium total) by mouth 3 (three) times daily. For bone health     cetirizine 10 MG tablet  Commonly known as:  ZYRTEC  Take 10 mg by mouth daily as needed for allergies.     colchicine 0.6 MG tablet  Take 0.5 tablets (0.3 mg total) by mouth daily.     feeding supplement (ENSURE COMPLETE) Liqd  Take 237 mLs by mouth 3 (three) times daily between meals.     FLUoxetine 10 MG capsule  Commonly known as:  PROZAC  Take 1 capsule (10 mg total) by mouth daily. For depression     folic acid 1 MG tablet  Commonly known as:  FOLVITE  Take 1 tablet (1 mg total) by mouth daily. For folic acid replacement     furosemide 40 MG tablet  Commonly known as:  LASIX  Take 0.5 tablets (20 mg total) by mouth daily.     gabapentin 300 MG capsule  Commonly known as:  NEURONTIN  Take 2 capsules (600 mg total) by mouth 3 (three) times daily. For anxiety/pain control     lipase/protease/amylase 12000 UNITS Cpep capsule  Commonly known as:  CREON-12/PANCREASE  Take 1 capsule by mouth 3 (three) times  daily before meals.     magnesium oxide 400 MG tablet  Commonly known as:  MAG-OX  Take 400 mg by mouth 4 (four) times daily.     metoprolol tartrate 25 MG tablet  Commonly known as:  LOPRESSOR  Take 12.5 mg by mouth 2 (two) times daily.     multivitamin with minerals Tabs tablet  Take 1 tablet by mouth daily. For vitamin replacement     omeprazole 40 MG capsule  Commonly known as:  PRILOSEC  Take 1 capsule (40 mg total) by mouth daily.     predniSONE 2.5 MG tablet  Commonly known as:  DELTASONE  Take 17.5 mg by mouth daily with breakfast. For 7 days     sodium bicarbonate 650 MG tablet  Take 650 mg by mouth daily.     thiamine 100 MG tablet  Commonly known as:  VITAMIN B-1  Take 1 tablet (100 mg total) by mouth daily. For low thiamine     vitamin B-12 250 MCG tablet  Commonly known as:  CYANOCOBALAMIN  Take 1 tablet (250 mcg total) by mouth every evening.     Vitamin D (Ergocalciferol) 50000 UNITS Caps capsule  Commonly known as:  DRISDOL  Take 1 capsule (50,000 Units total) by mouth every 7 (seven) days.  Thursdays: For bone health        Disposition and follow-up:   [name] was discharged from [facility] in Good condition.  At the hospital follow up visit please address:  1.  Hyperkalemia, Lasix changed to 20 mg QD  2.  Labs / imaging needed at time of follow-up: BMET  3.  Pending labs/ test needing follow-up: None  Follow-up Appointments: Follow-up Information   Follow up with Cresenciano Genre, MD. (Appt at IM clinic on July 23rd @ 8:15AM.)    Specialty:  Internal Medicine   Contact information:   [place] [id number] [phone]       Follow up with VAN Wilber Oliphant, MD. (Go to office on Monday July 23rd after your appointment with Dr. Aundra Dubin. Nurse will be able to take out stitches.)    Specialty:  Cardiothoracic Surgery   Contact information:   [place]  [phone]       Discharge Instructions: Discharge Instructions   Diet - low sodium heart healthy    Complete by:  As directed      Increase activity slowly    Complete by:  As directed            Consultations:    Procedures Performed:  Dg Chest 2 View  12/07/2013   CLINICAL DATA:  Atelectasis  EXAM: CHEST  2 VIEW  COMPARISON:  12/06/2013  FINDINGS: The cardiac shadow is stable. The pericardial drain is been removed in the interval. A right-sided jugular line is again seen in the mid superior vena cava. No pneumothorax is noted. Small pleural effusions are noted bilaterally but improved from the prior study. Bibasilar opacities are again seen slightly worse on the left than the right also improved from the prior study. No acute bony abnormality is noted.  IMPRESSION: Improving bibasilar changes.   Electronically Signed   By: Inez Catalina M.D.   On: 12/07/2013 07:08   Dg Chest 2 View  12/02/2013   CLINICAL DATA:  Shortness of breath  EXAM: CHEST  2 VIEW  COMPARISON:  11/28/2013  FINDINGS: Cardiac shadow is again enlarged. Bibasilar infiltrates are seen left greater than right with associated effusion. This is increased slightly in the interval from the prior exam particularly in the left lower lobe.  IMPRESSION: Slight increase in the degree of basilar infiltrates with associated effusions.   Electronically Signed   By: Inez Catalina M.D.   On: 12/02/2013 15:44   Dg Chest 2 View  11/28/2013   CLINICAL DATA:  increased sob  EXAM: CHEST  2 VIEW  COMPARISON:  Prior radiograph from 11/26/2013  FINDINGS: Cardiomegaly is stable as compared to prior exam.  Lungs are mildly hypoinflated. There is persistent diffuse pulmonary edema, slightly improved from prior. Bilateral pleural effusions are also slightly improved. No definite new focal infiltrate. There is no pneumothorax.  Chronic fracture deformity of the proximal left humerus noted, unchanged. No acute osseous abnormality.  IMPRESSION:  Slight interval improvement in diffuse pulmonary edema with decreased size of bilateral pleural effusions, consistent with improving CHF.   Electronically Signed   By: Jeannine Boga M.D.   On: 11/28/2013 21:47   Dg Chest 2 View  11/22/2013   CLINICAL DATA:  Two week history of cough  EXAM: CHEST  2 VIEW  COMPARISON:  Prior chest x-ray 09/25/2013  FINDINGS: Stable cardiac and mediastinal contours. Interval resolution of right lower lobe infiltrate  compared to 09/25/2013. No new airspace consolidation. No pulmonary edema, pleural effusion or pneumothorax. No suspicious pulmonary nodule or mass. Stable background bronchitic changes. No acute osseous abnormality.  IMPRESSION: No active cardiopulmonary disease.  Interval resolution of right lower lobe infiltrate compared to 09/25/2013.   Electronically Signed   By: Jacqulynn Cadet M.D.   On: 11/22/2013 10:29   Portable Chest 1 View  12/18/2013   CLINICAL DATA:  Bibasilar crackles  EXAM: PORTABLE CHEST - 1 VIEW  COMPARISON:  December 07, 2013  FINDINGS: The heart size and mediastinal contours are stable. The heart size is enlarged. There is no focal infiltrate, pulmonary edema, or pleural effusion. The visualized skeletal structures are stable. There are chronic deformities of bilateral proximal humerus.  IMPRESSION: No active cardiopulmonary disease.   Electronically Signed   By: Abelardo Diesel M.D.   On: 12/18/2013 08:37   Dg Chest Port 1 View  12/06/2013   CLINICAL DATA:  Status post surgery.  EXAM: PORTABLE CHEST - 1 VIEW  COMPARISON:  One-view chest 12/05/2013  FINDINGS: A right IJ line is stable in position. A left-sided chest tube is stable. The heart is enlarged. Bilateral pleural effusions are similar to the prior study. Bibasilar airspace disease is noted. Mild edema is unchanged. Posttraumatic changes of the humerus are noted bilaterally.  IMPRESSION: Stable bilateral pleural effusions and bibasilar airspace disease, likely reflecting atelectasis.   Stable mild edema.  The support apparatus is stable.   Electronically Signed   By: Lawrence Santiago M.D.   On: 12/06/2013 07:25   Dg Chest Port 1 View  12/05/2013   CLINICAL DATA:  Pericardial effusion  EXAM: PORTABLE CHEST - 1 VIEW  COMPARISON:  12/04/2013  FINDINGS: A pericardial drain is again noted in the midline in the inferior aspect of the chest. A right-sided jugular central venous line is noted in the mid superior vena cava. The cardiac shadow remains enlarged. Persistent bilateral effusions and infiltrate are seen. Vascular congestion is again noted. Changes consistent with prior bilateral humeral fractures are seen.  IMPRESSION: Stable appearance of the chest when compared with the prior exam.   Electronically Signed   By: Inez Catalina M.D.   On: 12/05/2013 07:31   Dg Chest Port 1 View  12/04/2013   CLINICAL DATA:  Postop thoracic surgery.  EXAM: PORTABLE CHEST - 1 VIEW  COMPARISON:  Same day.  FINDINGS: Endotracheal tube has been removed. Right internal jugular catheter is unchanged in position with tip in expected position of the SVC. Pericardial drain is unchanged. No pneumothorax is noted. Mild left pleural effusion is noted along the lateral wall of the left hemi thorax. Stable left basilar atelectasis. Mild central pulmonary vascular congestion and probable perihilar edema is noted.  IMPRESSION: Endotracheal tube has been removed. Stable mild left pleural effusion is noted along left lateral chest wall. Stable left basilar atelectasis. Mild central pulmonary vascular congestion with associated perihilar edema is again noted.   Electronically Signed   By: Sabino Dick M.D.   On: 12/04/2013 15:43   Dg Chest Portable 1 View  12/04/2013   CLINICAL DATA:  Subxiphoid pericardial window  EXAM: PORTABLE CHEST - 1 VIEW  COMPARISON:  12/02/2013  FINDINGS: Endotracheal tube in good position. Right jugular catheter tip in the SVC. No pneumothorax.  Interval development of pulmonary edema.  Mild to moderate  left pleural effusion with some migration of pleural fluid into the apex. Left lower lobe atelectasis is present. Small right effusion is present.  Large bore thoracostomy tube in the pericardium.  IMPRESSION: Endotracheal tube in good position.  Pericardial drain in place.  Progression of pulmonary edema. Bilateral pleural effusions and left lower lobe atelectasis are present.   Electronically Signed   By: Franchot Gallo M.D.   On: 12/04/2013 15:16   Dg Chest Port 1 View  11/26/2013   CLINICAL DATA:  Pulmonary  EXAM: PORTABLE CHEST - 1 VIEW  COMPARISON:  11/25/2013  FINDINGS: Cardiac enlargement. Pulmonary vascular congestion and bilateral edema is unchanged. Bilateral effusion and bibasilar atelectasis also unchanged.  IMPRESSION: Congestive heart failure with edema and effusions unchanged from the prior study.   Electronically Signed   By: Franchot Gallo M.D.   On: 11/26/2013 07:34   Dg Chest Port 1 View  11/25/2013   CLINICAL DATA:  Followup pulmonary edema  EXAM: PORTABLE CHEST - 1 VIEW  COMPARISON:  11/24/2013  FINDINGS: Bilateral effusions with perihilar and more confluent lung base opacity is stable from the prior exam. Cardiac silhouette is mildly enlarged. No pneumothorax.  IMPRESSION: No significant change from the previous day's study. Findings consistent with pulmonary edema with bilateral effusions.   Electronically Signed   By: Lajean Manes M.D.   On: 11/25/2013 07:19   Dg Chest Port 1 View  11/24/2013   CLINICAL DATA:  Chest pain, shortness of breath.  EXAM: PORTABLE CHEST - 1 VIEW  COMPARISON:  November 22, 2013.  FINDINGS: Mild cardiomegaly is noted. Bilateral perihilar and basilar opacities are noted most consistent with pulmonary edema with mild associated pleural effusions. No pneumothorax is noted. Bony thorax is intact.  IMPRESSION: Bilateral perihilar and basilar opacities are noted most consistent with pulmonary edema. Mild bilateral pleural effusions are noted.   Electronically Signed    By: Sabino Dick M.D.   On: 11/24/2013 08:29    Admission HPI: Ms. Devora is a 58yo woman with PMHx of chronic diastolic CHF, CKD stage III, pernicious anemia, HTN and history of pancreatitis who presents to the ED from her cardiologist for hyperkalemia. Dr. Aundra Dubin called from the Internal Medicine Clinic when her labs came back showing hyperkalemia with a potassium of 6.0. The cardiologist office then informed Ms. Weatherwax of her abnormal labs and sent her to the ED. Ms. Kaufman has no complaints on exam today. She states her urine output has been adequate and she does not feel like she is retaining fluid. She denies any fever, chills, dizziness, weakness, chest pain, heart palpitations, shortness of breath, nausea or paresthesias.    Hospital Course by problem list:  1. Hyperkalemia- resolved: Pt was found to have hyperkalemia from labs drawn in the clinic. Patient was contacted at cardiologist appointment and told to go to ED. Potassium of 6.0. Upon admission at the ER, potassium found to be 5.9. No complaints of weakness, nausea, paresthesias, or palpitations. EKG was NSR, no peaked T waves. Pt was given insulin 10 units, 1 amp of D50, a NS 1 L bolus, and calcium gluconate 1g in ED. Repeat K 5.0 this AM. Patient denies taking any potassium supplementation at home. Patient may be getting over diuresed with Lasix 40 mg which is causing acute AKI that is leading to hyperkalemia. Lasix was decreased to 20 mg daily. Patient will need repeat BMET at outpatient appointment on July 23rd.   2. 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 More recent baseline is 2-2.5. Possibly from over diuresis or fluid shift due to prednisone. Repeat BMP shows Cr  improved to 2.89. Patient was placed on sodium bicarb to 1,300 mg daily and vitamin D 50,000 units once a week. She was placed on a renal diet with 1200 ml fluid restriction. Daily weights and I&Os were monitored.   3. H/o  Pericarditis with Pericardial Effusion s/p Pericardial Window: No chest pain, SOB. No pericardial friction rub or JVD on exam. On colchicine 0.6 mg daily at home and prednisone 17.5 mg daily until 12/26/13 as part of steroid tapered regimen. Patient was continued on Colchicine and Prednisone.  4. Hypocalcemia: Likely 2/2 chronic renal failure. Calcium 6.3 on admission, 6.7 today. Baseline is 7. Denies cramps or paresthesias. She was continued on Calcium Carbonate 1,200 mg TID.  5. Hypomagnesemia: Mg 1.5 on admission, this is about her baseline. She was continued on Magnesium oxide 400 mg BID.  6. HTN: BP controlled, 130-140s/80-90s. On metoprolol tartrate 12.5 mg BID and amlodipine 5 mg daily at home. These medications were continued.   7. Chronic Diastolic CHF: Pt denies chest pain, shortness of breath, or lower extremity edema. No LEE b/l on exam. On furosemide 40 mg daily and metoprolol 12.5 mg amlodipine 5 mg daily at home. Lasix was decreased to 20mg  daily. Metoprolol and Amlodipine were continued. Daily weights and I&Os taken.  8. Anemia of Chronic Disease: Hgb 8.8 on admission, 8.9 today. Most likely 2/2 to CKD, history of pernicious anemia, folate and vitamin B 12 deficiency. Baseline is 8-10. Hbg monitored, patient asymptomatic. She was placed on Folic acid 1 mg daily and Vitamin B12 250 mcg daily.    Discharge Vitals:   BP 136/88  Pulse 92  Temp(Src) 98.2 F (36.8 C) (Oral)  Resp 18  Ht 5\' 1"  (1.549 m)  Wt 99 lb 3.3 oz (45 kg)  BMI 18.75 kg/m2  SpO2 90% Physical Exam General: alert, cooperative, pleasant, laying in bed, NAD  HEENT: /AT, EOMI, PERRL, sclera anicteric, mucus membranes moist  Neck: supple, no JVD  CV: RRR, normal S1/S2, no m/g/r  Pulm: Mild rales heard in lower lobes, no wheezes  Abd: BS+, soft, nondistended, nontender  Ext: warm, no edema, moves all     Discharge Labs:  Results for orders placed during the hospital encounter of 12/17/13 (from the past 24  hour(s))  CBC     Status: Abnormal   Collection Time    12/17/13  5:13 PM      Result Value Ref Range   WBC 7.2  4.0 - 10.5 K/uL   RBC 2.88 (*) 3.87 - 5.11 MIL/uL   Hemoglobin 8.8 (*) 12.0 - 15.0 g/dL   HCT 28.7 (*) 36.0 - 46.0 %   MCV 99.7  78.0 - 100.0 fL   MCH 30.6  26.0 - 34.0 pg   MCHC 30.7  30.0 - 36.0 g/dL   RDW 15.4  11.5 - 15.5 %   Platelets 419 (*) 150 - 400 K/uL  BASIC METABOLIC PANEL     Status: Abnormal   Collection Time    12/17/13  5:13 PM      Result Value Ref Range   Sodium 140  137 - 147 mEq/L   Potassium 5.9 (*) 3.7 - 5.3 mEq/L   Chloride 102  96 - 112 mEq/L   CO2 18 (*) 19 - 32 mEq/L   Glucose, Bld 163 (*) 70 - 99 mg/dL   BUN 43 (*) 6 - 23 mg/dL   Creatinine, Ser 3.24 (*) 0.50 - 1.10 mg/dL   Calcium 6.3 (*) 8.4 - 10.5 mg/dL  GFR calc non Af Amer 15 (*) >90 mL/min   GFR calc Af Amer 17 (*) >90 mL/min   Anion gap 20 (*) 5 - 15  GLUCOSE, CAPILLARY     Status: Abnormal   Collection Time    12/17/13  9:21 PM      Result Value Ref Range   Glucose-Capillary 132 (*) 70 - 99 mg/dL  CBC     Status: Abnormal   Collection Time    12/18/13  5:45 AM      Result Value Ref Range   WBC 7.5  4.0 - 10.5 K/uL   RBC 2.93 (*) 3.87 - 5.11 MIL/uL   Hemoglobin 8.9 (*) 12.0 - 15.0 g/dL   HCT 29.0 (*) 36.0 - 46.0 %   MCV 99.0  78.0 - 100.0 fL   MCH 30.4  26.0 - 34.0 pg   MCHC 30.7  30.0 - 36.0 g/dL   RDW 15.5  11.5 - 15.5 %   Platelets 332  150 - 400 K/uL  RENAL FUNCTION PANEL     Status: Abnormal   Collection Time    12/18/13  5:45 AM      Result Value Ref Range   Sodium 144  137 - 147 mEq/L   Potassium 5.0  3.7 - 5.3 mEq/L   Chloride 106  96 - 112 mEq/L   CO2 20  19 - 32 mEq/L   Glucose, Bld 75  70 - 99 mg/dL   BUN 42 (*) 6 - 23 mg/dL   Creatinine, Ser 2.89 (*) 0.50 - 1.10 mg/dL   Calcium 6.7 (*) 8.4 - 10.5 mg/dL   Phosphorus 4.7 (*) 2.3 - 4.6 mg/dL   Albumin 2.6 (*) 3.5 - 5.2 g/dL   GFR calc non Af Amer 17 (*) >90 mL/min   GFR calc Af Amer 20 (*) >90 mL/min    Anion gap 18 (*) 5 - 15  MAGNESIUM     Status: None   Collection Time    12/18/13  5:45 AM      Result Value Ref Range   Magnesium 1.5  1.5 - 2.5 mg/dL    Signed: Albin Felling, MD 12/18/2013, 3:02 PM    Services Ordered on Discharge: None Equipment Ordered on Discharge: None

## 2013-12-18 NOTE — Progress Notes (Signed)
New Admission Note:   Arrival: via stretcher from ED with tech Mental Orientation: A&Ox4 Telemetry: placed on box 6e08, NSR Assessment:  See docflowsheet Skin: intact, small incision at upper mid chest-clean, dry, intact IV: right hand IV, clean, dry, patent Pain: denies pain Safety Measures:  Call bell placed within reach; patient instructed on use of call bell and verbalized understanding. Bed in lowest position.  Non-skid socks on.   [address] [place] Orientation: Patient oriented to staff, room, and unit. Family: none at bedside  Orders have been reviewed and implemented. Will continue to monitor.  Arlyss Queen, RN, BSN

## 2013-12-18 NOTE — Progress Notes (Signed)
[facility]  Patient Status: Active (receiving services up to time of hospitalization)  [facility] is providing the following services: RN, PT and OT  If patient discharges after hours, please call [phone].   Janae Sauce 12/18/2013, 10:26 AM

## 2013-12-18 NOTE — Progress Notes (Signed)
Subjective: Patient has no complaints. She denies SOB, CP, palpitations, and muscle cramps. Patient denies taking any potassium supplementation that may have led to her hyperkalemia.  Objective: Vital signs in last 24 hours: Filed Vitals:   12/17/13 2000 12/17/13 2047 12/18/13 0500 12/18/13 0513  BP: 131/86 147/86  144/77  Pulse: 78 79  84  Temp:  97.9 F (36.6 C)  98.2 F (36.8 C)  TempSrc:  Oral  Oral  Resp: 13 15  16   Weight:  99 lb (44.906 kg) 99 lb 3.3 oz (45 kg)   SpO2: 100% 100%  97%   Weight change:   Intake/Output Summary (Last 24 hours) at 12/18/13 0904 Last data filed at 12/17/13 2246  Gross per 24 hour  Intake      3 ml  Output      0 ml  Net      3 ml   Physical Exam General: alert, cooperative, pleasant, laying in bed, NAD HEENT: [place]/AT, EOMI, PERRL, sclera anicteric, mucus membranes moist Neck: supple, no JVD CV: RRR, normal S1/S2, no m/g/r Pulm: Mild rales heard in lower lobes, no wheezes Abd: BS+, soft, nondistended, nontender Ext: warm, no edema, moves all  Lab Results: Basic Metabolic Panel:  Recent Labs Lab 12/17/13 1356 12/17/13 1713 12/18/13 0545  NA 135 140 144  K 6.0* 5.9* 5.0  CL 98 102 106  CO2 18* 18* 20  GLUCOSE 258* 163* 75  BUN 44* 43* 42*  CREATININE 3.23* 3.24* 2.89*  CALCIUM 6.5* 6.3* 6.7*  MG 1.5  --  1.5  PHOS  --   --  4.7*   Liver Function Tests:  Recent Labs Lab 12/18/13 0545  ALBUMIN 2.6*   No results found for this basename: LIPASE, AMYLASE,  in the last 168 hours No results found for this basename: AMMONIA,  in the last 168 hours CBC:  Recent Labs Lab 12/17/13 1356 12/17/13 1713 12/18/13 0545  WBC 7.7 7.2 7.5  NEUTROABS 6.6  --   --   HGB 9.0* 8.8* 8.9*  HCT 30.3* 28.7* 29.0*  MCV 101.3* 99.7 99.0  PLT 421* 419* 332   Cardiac Enzymes: No results found for this basename: CKTOTAL, CKMB, CKMBINDEX, TROPONINI,  in the last 168 hours BNP: No results found for this basename: PROBNP,  in the last 168  hours D-Dimer: No results found for this basename: DDIMER,  in the last 168 hours CBG:  Recent Labs Lab 12/17/13 2121  GLUCAP 132*   Hemoglobin A1C: No results found for this basename: HGBA1C,  in the last 168 hours Fasting Lipid Panel: No results found for this basename: CHOL, HDL, LDLCALC, TRIG, CHOLHDL, LDLDIRECT,  in the last 168 hours Thyroid Function Tests: No results found for this basename: TSH, T4TOTAL, FREET4, T3FREE, THYROIDAB,  in the last 168 hours Coagulation: No results found for this basename: LABPROT, INR,  in the last 168 hours Anemia Panel: No results found for this basename: VITAMINB12, FOLATE, FERRITIN, TIBC, IRON, RETICCTPCT,  in the last 168 hours Urine Drug Screen: Drugs of Abuse     Component Value Date/Time   LABOPIA POSITIVE* 11/22/2013 1406   LABOPIA NEG 09/18/2011 0936   COCAINSCRNUR NONE DETECTED 11/22/2013 1406   COCAINSCRNUR NEG 09/18/2011 0936   LABBENZ NONE DETECTED 11/22/2013 1406   LABBENZ NEG 09/18/2011 0936   LABBENZ NEG 04/10/2011 1130   AMPHETMU NONE DETECTED 11/22/2013 1406   AMPHETMU NEG 04/10/2011 1130   THCU NONE DETECTED 11/22/2013 1406   LABBARB NONE DETECTED  11/22/2013 1406   LABBARB NEG 09/18/2011 0936    Alcohol Level: No results found for this basename: ETH,  in the last 168 hours Urinalysis: No results found for this basename: COLORURINE, APPERANCEUR, LABSPEC, PHURINE, GLUCOSEU, HGBUR, BILIRUBINUR, KETONESUR, PROTEINUR, UROBILINOGEN, NITRITE, LEUKOCYTESUR,  in the last 168 hours   Micro Results: No results found for this or any previous visit (from the past 240 hour(s)). Studies/Results: Portable Chest 1 View  12/18/2013   CLINICAL DATA:  Bibasilar crackles  EXAM: PORTABLE CHEST - 1 VIEW  COMPARISON:  December 07, 2013  FINDINGS: The heart size and mediastinal contours are stable. The heart size is enlarged. There is no focal infiltrate, pulmonary edema, or pleural effusion. The visualized skeletal structures are stable. There are  chronic deformities of bilateral proximal humerus.  IMPRESSION: No active cardiopulmonary disease.   Electronically Signed   By: Abelardo Diesel M.D.   On: 12/18/2013 08:37   Medications: I have reviewed the patient's current medications. Scheduled Meds: . amLODipine  5 mg Oral Daily  . calcium carbonate  6 tablet Oral TID  . colchicine  0.3 mg Oral Daily  . feeding supplement (ENSURE COMPLETE)  237 mL Oral TID BM  . FLUoxetine  10 mg Oral Daily  . folic acid  1 mg Oral Daily  . furosemide  40 mg Oral Daily  . gabapentin  600 mg Oral TID  . heparin  5,000 Units Subcutaneous 3 times per day  . lipase/protease/amylase  1 capsule Oral TID AC  . magnesium oxide  400 mg Oral BID  . metoprolol tartrate  12.5 mg Oral BID  . multivitamin with minerals  1 tablet Oral Daily  . pantoprazole  40 mg Oral Daily  . predniSONE  17.5 mg Oral Q breakfast  . sodium bicarbonate  1,300 mg Oral BID  . sodium chloride  3 mL Intravenous Q12H  . thiamine  100 mg Oral Daily  . vitamin B-12  250 mcg Oral QPM  . Vitamin D (Ergocalciferol)  50,000 Units Oral Q Thu   Continuous Infusions:  PRN Meds:. Assessment/Plan: 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    1. Hyperkalemia- Resolved: Pt was found to have hyperkalemia from labs drawn in the clinic. Potassium of 6.0. Upon admission at the ER, potassium found to be 5.9. No complaints of weakness, nausea, paresthesias, or palpitations. EKG was NSR, no peaked T waves. Pt was given insulin 10 units, 1 amp of D50 and a NS 1 L bolus in the ED. She was given Calcium gluconate 1g. Repeat K 5.0 this AM. Patient denies taking any potassium supplementation at home. Patient may be getting over diuresed with Lasix 40 mg which is causing acute AKI that is  leading to hyperkalemia. - Decreasing Lasix to 20mg  daily - Will have repeat BMET done as outpatient next Monday 7/20  2. Acute on Chronic Kidney Disease stage III 2/2 FSGS: Cr 3.24 on admission. More recent baseline is 2-2.5. Possibly from over diuresis or fluid shift due to prednisone. Repeat BMP shows Cr improved to 2.89.  - Continue sodium bicarb to 1,300 mg daily  - Continue Renal diet with 1200 mL fluid restriction  - Continue Daily weights and I/Os  - On vitamin D 50,000 units once a week   3.  H/o Pericarditis with pericardial effusion s/p pericardial window 12/04/13: No chest pain, SOB. No pericardial friction rub or JVD on exam. On colchicine 0.6 mg daily at home and prednisone 17.5 mg daily until 12/26/13 as part of steroid tapered regimen.  - Continue Colchicine 0.3 mg daily  - Continue Prednisone 17.5 mg daily   4. Hypocalcemia: Likely 2/2 chronic renal failure. Calcium 6.3 on admission, 6.7 today. Baseline is 7. Denies cramps or paresthesias.  - Continue Calcium Carbonate 1,200 mg TID   5. Hypomagnesia: Mg 1.5 on admission, this is about her baseline.  - Continue Magnesium oxide 400 mg BID   6. HTN: BP controlled, 130-140s/80-90s. On metoprolol tartrate 12.5 mg BID and amlodipine 5 mg daily at home.  - Continue Metoprolol tartrate 12.5 mg BID  - Continue Amlodipine 5 mg daily   7. Chronic Diastolic Congestive Heart Failure: Pt denies chest pain, shortness of breath, or lower extremity edema. No LEE b/l on exam. On furosemide 40 mg daily and metoprolol 12.5 mg amlodipine 5 mg daily at home.  - Decrease Lasix to 20 mg daily  - Continue Metoprolol tartrate 12.5 mg BID  - Continue Amlodipine 5 mg daily  - Renal diet with 1200 mL fluid restriction  - Daily weights and I/Os   8. Anemia of Chronic Disease: Hgb 8.8 on admission, 8.9 today. Most likely 2/2 to CKD, history of pernicious anemia, folate and vitamin B 12 deficiency. Baseline is 8-10. Will continue to monitor but hold off  on transfusion unless becomes symptomatic.  - Continue to monitor  - Folic acid 1 mg daily  - Vitamin B12 250 mcg daily   Diet: Renal diet with 1200 ml fluid restriction DVT/PE ppx: Heaprin 5,000 units Q8H  Dispo: Discharge today.   The patient does have a current PCP Otho Bellows, MD) and does need an [facility] hospital follow-up appointment after discharge.  The patient does not have transportation limitations that hinder transportation to clinic appointments.  .Services Needed at time of discharge: Y = Yes, Blank = No PT:   OT:   RN:   Equipment:   Other:     LOS: 1 day   Albin Felling, MD 12/18/2013, 9:04 AM

## 2013-12-18 NOTE — H&P (Addendum)
Attending physician admission note: I personally interviewed and examined this patient. History, physical findings, problem assessment and management plan, accurate as recorded by resident physician Dr. Osa Craver. Summary: Pleasant 58 year old woman recently hospitalized for a symptomatic pericardial effusion felt to be related to a viral pneumonia. She required a pericardial drainage procedure. She was discharged in stable condition on steroids and colchicine. She has advanced renal insufficiency with baseline creatinine in the 2.5-3.2 range. At time of routine followup clinic visit with her cardiologist on day of admission potassium noted to be 6.0 and she was referred to the emergency department for further evaluation.  Electrocardiogram showed no acute or toxic changes. Repeat potassium was 5.9. She received calcium, D50 glucose, insulin, and normal saline in the emergency department. She was admitted for overnight observation. She states that she is not taking any potassium supplements at home. Repeat potassium this morning is 5.0. She will be discharged later today. No other acute issues identified at this time.

## 2013-12-19 ENCOUNTER — Ambulatory Visit (HOSPITAL_COMMUNITY): Payer: PRIVATE HEALTH INSURANCE

## 2013-12-19 ENCOUNTER — Other Ambulatory Visit: Payer: Self-pay | Admitting: Cardiothoracic Surgery

## 2013-12-19 DIAGNOSIS — I309 Acute pericarditis, unspecified: Secondary | ICD-10-CM

## 2013-12-22 ENCOUNTER — Ambulatory Visit: Payer: PRIVATE HEALTH INSURANCE

## 2013-12-25 ENCOUNTER — Encounter: Payer: Self-pay | Admitting: Internal Medicine

## 2013-12-25 ENCOUNTER — Ambulatory Visit (INDEPENDENT_AMBULATORY_CARE_PROVIDER_SITE_OTHER): Payer: PRIVATE HEALTH INSURANCE | Admitting: Internal Medicine

## 2013-12-25 ENCOUNTER — Ambulatory Visit (INDEPENDENT_AMBULATORY_CARE_PROVIDER_SITE_OTHER): Payer: PRIVATE HEALTH INSURANCE

## 2013-12-25 VITALS — BP 140/91 | HR 83 | Temp 97.8°F | Wt 104.0 lb

## 2013-12-25 DIAGNOSIS — D381 Neoplasm of uncertain behavior of trachea, bronchus and lung: Secondary | ICD-10-CM

## 2013-12-25 DIAGNOSIS — R600 Localized edema: Secondary | ICD-10-CM | POA: Insufficient documentation

## 2013-12-25 DIAGNOSIS — E875 Hyperkalemia: Secondary | ICD-10-CM

## 2013-12-25 DIAGNOSIS — N179 Acute kidney failure, unspecified: Secondary | ICD-10-CM

## 2013-12-25 DIAGNOSIS — Z4802 Encounter for removal of sutures: Secondary | ICD-10-CM

## 2013-12-25 DIAGNOSIS — R609 Edema, unspecified: Secondary | ICD-10-CM

## 2013-12-25 DIAGNOSIS — I309 Acute pericarditis, unspecified: Secondary | ICD-10-CM

## 2013-12-25 DIAGNOSIS — N184 Chronic kidney disease, stage 4 (severe): Secondary | ICD-10-CM

## 2013-12-25 LAB — BASIC METABOLIC PANEL WITH GFR
BUN: 43 mg/dL — AB (ref 6–23)
CHLORIDE: 102 meq/L (ref 96–112)
CO2: 19 mEq/L (ref 19–32)
Calcium: 7.4 mg/dL — ABNORMAL LOW (ref 8.4–10.5)
Creat: 2.7 mg/dL — ABNORMAL HIGH (ref 0.50–1.10)
GFR, EST AFRICAN AMERICAN: 22 mL/min — AB
GFR, Est Non African American: 19 mL/min — ABNORMAL LOW
Glucose, Bld: 151 mg/dL — ABNORMAL HIGH (ref 70–99)
Potassium: 4.9 mEq/L (ref 3.5–5.3)
Sodium: 139 mEq/L (ref 135–145)

## 2013-12-25 LAB — MAGNESIUM: Magnesium: 1.4 mg/dL — ABNORMAL LOW (ref 1.5–2.5)

## 2013-12-25 NOTE — Assessment & Plan Note (Signed)
Pending BMET

## 2013-12-25 NOTE — Progress Notes (Signed)
Attending physician note: Presenting problems, physical findings, medications, and management plan, reviewed with resident physician Dr. Lenise Arena and I concur with her assessment. Patient with multiple complex medical problems well known to me from 2 recent hospital admissions. Condition currently stable. Murriel Hopper, M.D., [place]

## 2013-12-25 NOTE — Progress Notes (Signed)
   Subjective:    [name], female    DOB: 10-Sep-1955, 58 y.o.   MRN: [id number]  HPI Comments: 58 y.o extensive PMH FSGS with CKD 4 (creatinine rising >2.0 over months), anemia of chronic disease (BL hbg ~8), recent pericarditis with moderate tamponade s/p subxiphoid pericardial window 7/2 with drainage and removal of drain 7/4 currently on steroid taper for 3 months with f/u echo 01/01/14   She presents for: 1. HFU for AKI and hyperK. She is feeling fine.  2. Melena-She saw Dr. Benson Norway who will likely due repeat colonoscopy 01/2014  3. Pericarditis with mod tamponade history with repeat echo 01/01/14 and f/u with cards 01/19/14 at 12 PM. She will go to Dr. Berlin Hun today to get last suture removed. She is still on Prednisone taper currently 7 tablets of 10 mg but will go to 6 tomorrow and colchicine for 3 months 4. C/o feet swelling relieved with elevation     Review of Systems  Respiratory: Negative for shortness of breath.   Cardiovascular: Positive for leg swelling. Negative for chest pain.  Gastrointestinal: Negative for blood in stool.       Objective:   Physical Exam  Nursing note and vitals reviewed. Constitutional: She is oriented to person, place, and time. Vital signs are normal. She appears well-developed and well-nourished. She is cooperative. No distress.  HENT:  Head: Normocephalic and atraumatic.  Mouth/Throat: Oropharynx is clear and moist and mucous membranes are normal. She has dentures. No oropharyngeal exudate.  Eyes: Conjunctivae are normal. Pupils are equal, round, and reactive to light. Right eye exhibits no discharge. Left eye exhibits no discharge. No scleral icterus.  Cardiovascular: Normal rate, regular rhythm, S1 normal, S2 normal and normal heart sounds.   No murmur heard. Trace lower ext edema to 1+ pedal edema   Pulmonary/Chest: Effort normal and breath sounds normal. No respiratory distress. She has no wheezes.  Neurological: She is  alert and oriented to person, place, and time. Gait normal.  Skin: Skin is warm, dry and intact. No rash noted. She is not diaphoretic.  Psychiatric: She has a normal mood and affect. Her speech is normal and behavior is normal. Judgment and thought content normal. Cognition and memory are normal.          Assessment & Plan:  F/u in 2-3 months, sooner if needed.

## 2013-12-25 NOTE — Progress Notes (Unsigned)
Removed 1 suture from chest tube incision site. No signs of infection and pt tolerated well. She is scheduled to see PA with CXR on Monday 12/29/13

## 2013-12-25 NOTE — Assessment & Plan Note (Signed)
Will check Mag today

## 2013-12-25 NOTE — Assessment & Plan Note (Signed)
Continue reduced dose of Lasix 20 mg daily Will Rx [facility]

## 2013-12-25 NOTE — Patient Instructions (Signed)
General Instructions: Follow up in 2-3 months sooner if needed.  Take care   Treatment Goals:  Goals (1 Years of Data) as of 12/25/13         As of Today 12/18/13 12/18/13 12/17/13 12/17/13     Blood Pressure    . Blood Pressure < 140/90  140/91 136/88 144/77 147/86 131/86     Lifestyle    . Prevent Falls          . Reduce alcohol intake to X servings per day           Result Component    . HDL > 40          . LDL CALC < 130            Progress Toward Treatment Goals:  Treatment Goal 12/25/2013  Blood pressure at goal  Prevent falls -    Self Care Goals & Plans:  Self Care Goal 12/25/2013  Manage my medications take my medicines as prescribed; bring my medications to every visit; follow the sick day instructions if I am sick  Monitor my health keep track of my blood pressure  Eat healthy foods drink diet soda or water instead of juice or soda; eat more vegetables; eat foods that are low in salt; eat baked foods instead of fried foods; eat fruit for snacks and desserts; eat smaller portions  Be physically active find an activity I enjoy  Meeting treatment goals maintain the current self-care plan    No flowsheet data found.   Care Management & Community Referrals:  Referral 12/25/2013  Referrals made for care management support -  Referrals made to community resources none      Kidney Disease, Adult The kidneys are two organs that lie on either side of the spine between the middle of the back and the front of the abdomen. The kidneys:   Remove wastes and extra water from the blood.   Produce important hormones. These regulate blood pressure, help keep bones strong, and help create red blood cells.   Balance the fluids and chemicals in the blood and tissues. Kidney disease occurs when the kidneys are damaged. Kidney damage may be sudden (acute) or develop over a long period (chronic). A small amount of damage may not cause problems, but a large amount of damage may  make it difficult or impossible for the kidneys to work the way they should. Early detection and treatment of kidney disease may prevent kidney damage from becoming permanent or getting worse. Some kidney diseases are curable, but most are not. Many people with kidney disease are able to control the disease and live a normal life.  TYPES OF KIDNEY DISEASE  Acute kidney injury.Acute kidney injury occurs when there is sudden damage to the kidneys.  Chronic kidney disease. Chronic kidney disease occurs when the kidneys are damaged over a long period.  End-stage kidney disease. End-stage kidney disease occurs when the kidneys are so damaged that they stop working. In end-stage kidney disease, the kidneys cannot get better. CAUSES Any condition, disease, or event that damages the kidneys may cause kidney disease. Acute kidney injury.  A problem with blood flow to the kidneys. This may be caused by:   Blood loss.   Heart disease.   Severe burns.   Liver disease.  Direct damage to the kidneys. This may be caused by:  Some medicines.   A kidney infection.   Poisoning or consuming toxic substances.   A surgical wound.  A blow to the kidney area.   A problem with urine flow. This may be caused by:   Cancer.   Kidney stones.   An enlarged prostate. Chronic kidney disease. The most common causes of chronic kidney disease are diabetes and high blood pressure (hypertension). Chronic kidney disease may also be caused by:   Diseases that cause the filtering units of the kidneys to become inflamed.   Diseases that affect the immune system.   Genetic diseases.   Medicines that damage the kidneys, such as anti-inflammatory medicines.  Poisoning or exposure to toxic substances.   A reoccurring kidney or urinary infection.   A problem with urine flow. This may be caused by:  Cancer.   Kidney stones.   An enlarged prostate in males. End-stage kidney  disease. This kidney disease usually occurs when a chronic kidney disease gets worse. It may also occur after acute kidney injury.  SYMPTOMS   Swelling (edema) of the legs, ankles, or feet.   Tiredness (lethargy).   Nausea or vomiting.   Confusion.   Problems with urination, such as:   Painful or burning feeling during urination.   Decreased urine production.  Bloody urine.   Frequent urination, especially at night.  Hypertension.  Muscle twitches and cramps.   Shortness of breath.   Persistent itchiness.   Loss of appetite.  Metallic taste in the mouth.   Weakness.   Seizures.   Chest pain or pressure.   Trouble sleeping.   Headaches.   Abnormally dark or light skin.   Numbness in the hands or feet.   Easy bruising.   Frequent hiccups.   Menstruation stops. Sometimes, no symptoms are present. DIAGNOSIS  Kidney disease may be detected and diagnosed by tests, including blood, urine, imaging, or kidney biopsy tests.  TREATMENT  Acute kidney injury. Treatment of acute kidney injury varies depending on the cause and severity of the kidney damage. In mild cases, no treatment may be needed. The kidneys may heal on their own. If acute kidney injury is more severe, your caregiver will treat the cause of the kidney damage, help the kidneys heal, and prevent complications from occurring. Severe cases may require a procedure to remove toxic wastes from the body (dialysis) or surgery to repair kidney damage. Surgery may involve:   Repair of a torn kidney.   Removal of an obstruction.  Most of the time, you will need to stay overnight at the hospital.  Chronic kidney disease. Most chronic kidney diseases cannot be cured. Treatment usually involves relieving symptoms and preventing or slowing the progression of the disease. Treatment may include:   A special diet. You may need to avoid alcohol and foods that:   Have added salt.   Are  high in potassium.   Are high in protein.   Medicines. These may:   Lower blood pressure.   Relieve anemia.   Relieve swelling.   Protect the bones.  End-stage kidney disease. End-stage kidney disease is life-threatening and must be treated immediately. There are two treatments for end-stage kidney disease:   Dialysis.   Receiving a new kidney (kidney transplant). Both of these treatments have serious risks and consequences. In addition to having dialysis or a kidney transplant, you may need to take medicines to control hypertension and cholesterol and to decrease phosphorus levels in your blood. LENGTH OF ILLNESS  Acute kidney injury.The length of this disease varies greatly from person to person. Exactly how long it lasts depends on the cause  of the kidney damage. Acute kidney injury may develop into chronic kidney disease or end-stage kidney disease.  Chronic kidney disease. This disease usually lasts a lifetime. Chronic kidney disease may worsen over time to become end-stage kidney disease. The time it takes for end-stage kidney disease to develop varies from person to person.  End-stage kidney disease. This disease lasts until a kidney transplant is performed. PREVENTION  Kidney disease can sometimes be prevented. If you have diabetes, hypertension, or any other condition that may lead to kidney disease, you should try to prevent kidney disease with:   An appropriate diet.  Medicine.  Lifestyle changes. FOR MORE INFORMATION  American Association of Kidney Patients: BombTimer.gl  National Kidney Foundation: www.kidney.[place]: https://mathis.com/  [facility]: www.lifeoptions.org and www.kidneyschool.org  Document Released: 05/22/2005 Document Revised: 05/08/2012 Document Reviewed: 01/19/2012 [facility] Patient Information 2015 [place], [place]. This information is not intended to replace advice given to you by your health care  provider. Make sure you discuss any questions you have with your health care provider.  Hypomagnesemia Magnesium is a common ion (mineral) in the body which is needed for metabolism. It is about how the body handles food and other chemical reactions necessary for life. Only about 2% of the magnesium in our body is found in the blood. When this is low, it is called hypomagnesemia. The blood will measure only a tiny amount of the magnesium in our body. When it is low in our blood, it does not mean that the whole body supply is low. The normal serum concentration ranges from 1.8-2.5 mEq/L. When the level gets to be less than 1.0 mEq/L, a number of problems begin to happen.  CAUSES   Receiving intravenous fluids without magnesium replacement.  Loss of magnesium from the bowel by nasogastric suction.  Loss of magnesium from nausea and vomiting or severe diarrhea. Any of the inflammatory bowel conditions can cause this.  Abuse of alcohol often leads to low serum magnesium.  An inherited form of magnesium loss happens when the kidneys lose magnesium. This is called familial or primary hypomagnesemia.  Some medications such as diuretics also cause the loss of magnesium. SYMPTOMS  These following problems are worse if the changes in magnesium levels come on suddenly.  Tremor.  Confusion.  Muscle weakness.  Oversensitive to sights and sounds.  Sensitive reflexes.  Depression.  Muscular fibrillations.  Overreactivity of the nerves.  Irritability.  Psychosis.  Spasms of the hand muscles.  Tetany (where the muscles go into uncontrollable spasms). DIAGNOSIS  This condition can be diagnosed by blood tests. TREATMENT   In an emergency, magnesium can be given intravenously (by vein).  If the condition is less worrisome, it can be corrected by diet. High levels of magnesium are found in green leafy vegetables, peas, beans, and nuts among other things. It can also be given through  medications by mouth.  If it is being caused by medications, changes can be made.  If alcohol is a problem, help is available if there are difficulties giving it up. Document Released: 02/15/2005 Document Revised: 10/06/2013 Document Reviewed: 01/10/2008 [facility] Patient Information 2015 [place], [place]. This information is not intended to replace advice given to you by your health care provider. Make sure you discuss any questions you have with your health care provider.  Hyperkalemia Hyperkalemia is when you have too much potassium in your blood. This can be a life-threatening condition. Potassium is normally removed (excreted) from the body by the kidneys.  CAUSES  The potassium level in your body can become too high for the following reasons:  You take in too much potassium. You can do this by:  Using salt substitutes. They contain large amounts of potassium.  Taking potassium supplements from your caregiver. The dose may be too high for you.  Eating foods or taking nutritional products with potassium.  You excrete too little potassium. This can happen if:  Your kidneys are not functioning properly. Kidney (renal) disease is a very common cause of hyperkalemia.  You are taking medicines that lower your excretion of potassium, such as certain diuretic medicines.  You have an adrenal gland disease called Addison's disease.  You have a urinary tract obstruction, such as kidney stones.  You are on treatment to mechanically clean your blood (dialysis) and you skip a treatment.  You release a high amount of potassium from your cells into your blood. You may have a condition that causes potassium to move from your cells to your bloodstream. This can happen with:  Injury to muscles or other tissues. Most potassium is stored in the muscles.  Severe burns or infections.  Acidic blood plasma (acidosis). Acidosis can result from many diseases, such as uncontrolled diabetes. SYMPTOMS   Usually, there are no symptoms unless the potassium is dangerously high or has risen very quickly. Symptoms may include:  Irregular or very slow heartbeat.  Feeling sick to your stomach (nauseous).  Tiredness (fatigue).  Nerve problems such as tingling of the skin, numbness of the hands or feet, weakness, or paralysis. DIAGNOSIS  A simple blood test can measure the amount of potassium in your body. An electrocardiogram test of the heart can also help make the diagnosis. The heart may beat dangerously fast or slow down and stop beating with severe hyperkalemia.  TREATMENT  Treatment depends on how bad the condition is and on the underlying cause.  If the hyperkalemia is an emergency (causing heart problems or paralysis), many different medicines can be used alone or together to lower the potassium level briefly. This may include an insulin injection even if you are not diabetic. Emergency dialysis may be needed to remove potassium from the body.  If the hyperkalemia is less severe or dangerous, the underlying cause is treated. This can include taking medicines if needed. Your prescription medicines may be changed. You may also need to take a medicine to help your body get rid of potassium. You may need to eat a diet low in potassium. HOME CARE INSTRUCTIONS   Take medicines and supplements as directed by your caregiver.  Do not take any over-the-counter medicines, supplements, natural products, herbs, or vitamins without reviewing them with your caregiver. Certain supplements and natural food products can have high amounts of potassium. Other products (such as ibuprofen) can damage weak kidneys and raise your potassium.  You may be asked to do repeat lab tests. Be sure to follow these directions.  If you have kidney disease, you may need to follow a low potassium diet. SEEK MEDICAL CARE IF:   You notice an irregular or very slow heartbeat.  You feel lightheaded.  You develop weakness  that is unusual for you. SEEK IMMEDIATE MEDICAL CARE IF:   You have shortness of breath.  You have chest discomfort.  You pass out (faint). MAKE SURE YOU:   Understand these instructions.  Will watch your condition.  Will get help right away if you are not doing well or get worse. Document Released: 05/12/2002 Document Revised: 08/14/2011  Document Reviewed: 08/27/2013 [facility] Patient Information 2015 [place], [place]. This information is not intended to replace advice given to you by your health care provider. Make sure you discuss any questions you have with your health care provider.

## 2013-12-25 NOTE — Assessment & Plan Note (Signed)
Continue Prednisone and colchicine x  33month Consider referral to rheumatology as ANA titer 1:320 in 11/2013 though anti-ds DNA value low at 2, ESR and CRP elevated 11/2013     12/17/2013 Office Visit Written 12/17/2013 2:18 PM by TCresenciano Genre MD    Continue steroid taper currently on 7 tablets daily and colchicine  She will have f/u with cardiology today and repeat echo Friday

## 2013-12-25 NOTE — Assessment & Plan Note (Signed)
Pt saw Dr. Benson Norway will likely get repeat colonoscopy 01/2014

## 2013-12-25 NOTE — Assessment & Plan Note (Addendum)
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  Will check BMET today for K, Cr.  Pt to f/u with Dr. Justin Mend 01/20/14

## 2013-12-25 NOTE — Assessment & Plan Note (Signed)
Will check BMET today. Pt to continue po Calcium

## 2013-12-27 NOTE — ED Provider Notes (Signed)
I saw and evaluated the patient, reviewed the resident's note and I agree with the findings and plan.   EKG Interpretation   Date/Time:  Wednesday December 17 2013 16:56:02 EDT Ventricular Rate:  84 PR Interval:  172 QRS Duration: 66 QT Interval:  440 QTC Calculation: 519 R Axis:   102 Text Interpretation:  Normal sinus rhythm Slow R Progression-no change No  Hyper K+ changes. Prolonged QT Abnormal ECG Confirmed by Jeneen Rinks  MD, [facility]  [phone]) on 12/17/2013 5:21:48 PM      Pt seen and evaluated.  Awake and alert.  VS stable.  EKG without changes.  Labs show Hyper K+, and ARF/CRF. I agree with assessment as above. Patient seen by myself with Dr. wall. I concur regarding his assessment and plan.  Tanna Furry, MD 12/27/13 [phone]

## 2013-12-29 ENCOUNTER — Ambulatory Visit
Admission: RE | Admit: 2013-12-29 | Discharge: 2013-12-29 | Disposition: A | Discharge status: Home / Self Care | Payer: PRIVATE HEALTH INSURANCE | Source: Ambulatory Visit | Attending: Cardiothoracic Surgery | Admitting: Cardiothoracic Surgery

## 2013-12-29 ENCOUNTER — Ambulatory Visit (INDEPENDENT_AMBULATORY_CARE_PROVIDER_SITE_OTHER): Payer: Self-pay | Admitting: Physician Assistant

## 2013-12-29 VITALS — BP 143/96 | HR 90 | Ht 61.0 in | Wt 104.0 lb

## 2013-12-29 DIAGNOSIS — I319 Disease of pericardium, unspecified: Secondary | ICD-10-CM

## 2013-12-29 DIAGNOSIS — I309 Acute pericarditis, unspecified: Secondary | ICD-10-CM

## 2013-12-29 DIAGNOSIS — I313 Pericardial effusion (noninflammatory): Secondary | ICD-10-CM

## 2013-12-29 DIAGNOSIS — Z09 Encounter for follow-up examination after completed treatment for conditions other than malignant neoplasm: Secondary | ICD-10-CM

## 2013-12-29 DIAGNOSIS — I3139 Other pericardial effusion (noninflammatory): Secondary | ICD-10-CM

## 2013-12-29 NOTE — Progress Notes (Signed)
HPI: Patient returns for routine postoperative follow-up having undergone Sub Xiphoid Pericardial Window on 12/04/2013.  The patient's early postoperative recovery was unremarkable.  Since hospital discharge the patient reports she is doing well.  She continues to have lower extremity edema.  She states overall this is improving with use of Lasix and support hose.  She also states that the swelling is relieved when her feet are elevated.  She is ambulating with some minimal shortness of breath, and states she is no longer using her home oxygen.  Her appetite is also back to normal.  Current Outpatient Prescriptions  Medication Sig Dispense Refill  . acetaminophen (TYLENOL) 500 MG tablet Take 1 tablet (500 mg total) by mouth every 4 (four) hours as needed for mild pain, moderate pain or headache.  30 tablet  0  . amLODipine (NORVASC) 5 MG tablet Take 1 tablet (5 mg total) by mouth daily.  30 tablet  3  . calcium carbonate (TUMS) 500 MG chewable tablet Chew 6 tablets (1,200 mg of elemental calcium total) by mouth 3 (three) times daily. For bone health  90 tablet  3  . cetirizine (ZYRTEC) 10 MG tablet Take 10 mg by mouth daily as needed for allergies.      Marland Kitchen colchicine 0.6 MG tablet Take 0.5 tablets (0.3 mg total) by mouth daily.  40 tablet  0  . feeding supplement, ENSURE COMPLETE, (ENSURE COMPLETE) LIQD Take 237 mLs by mouth 3 (three) times daily between meals.  90 Bottle  1  . FLUoxetine (PROZAC) 10 MG capsule Take 1 capsule (10 mg total) by mouth daily. For depression  90 capsule  4  . folic acid (FOLVITE) 1 MG tablet Take 1 tablet (1 mg total) by mouth daily. For folic acid replacement  30 tablet  3  . furosemide (LASIX) 40 MG tablet Take 0.5 tablets (20 mg total) by mouth daily.  30 tablet  2  . gabapentin (NEURONTIN) 300 MG capsule Take 2 capsules (600 mg total) by mouth 3 (three) times daily. For anxiety/pain control  540 capsule  1  . lipase/protease/amylase (CREON-12/PANCREASE) 12000 UNITS CPEP  capsule Take 1 capsule by mouth 3 (three) times daily before meals.  270 capsule  3  . magnesium oxide (MAG-OX) 400 MG tablet Take 400 mg by mouth 4 (four) times daily.      . metoprolol tartrate (LOPRESSOR) 25 MG tablet Take 12.5 mg by mouth 2 (two) times daily.      . Multiple Vitamin (MULTIVITAMIN WITH MINERALS) TABS tablet Take 1 tablet by mouth daily. For vitamin replacement  90 tablet  3  . omeprazole (PRILOSEC) 40 MG capsule Take 1 capsule (40 mg total) by mouth daily.  90 capsule  4  . PREDNISONE PO Take by mouth. As instructed previously      . sodium bicarbonate 650 MG tablet Take 650 mg by mouth daily.      Marland Kitchen thiamine (VITAMIN B-1) 100 MG tablet Take 1 tablet (100 mg total) by mouth daily. For low thiamine  90 tablet  3  . vitamin B-12 (CYANOCOBALAMIN) 250 MCG tablet Take 1 tablet (250 mcg total) by mouth every evening.  90 tablet  3  . Vitamin D, Ergocalciferol, (DRISDOL) 50000 UNITS CAPS capsule Take 1 capsule (50,000 Units total) by mouth every 7 (seven) days. Thursdays: For bone health  4 capsule  3   No current facility-administered medications for this visit.    Physical Exam:  BP 143/96  Pulse 90  Ht 5'  1" (1.549 m)  Wt 104 lb (47.174 kg)  BMI 19.66 kg/m2  SpO2 94%  Gen: no apparent distress Heart: RRR Lungs: CTA bilaterally Incision:  Well healed  Diagnostic Tests:  CXR: no significant pleural effusion, chronic cardiomegaly, emphysematous changes  A/P:  1. S/P Sub Xiphoid pericardial window- doing well 2. Peripheral LE edema- continue TED Hose, diuretic per Cardiology/PCP 3. RTC prn, plan for f/u ECHO on 01/01/2014 with Cardiology who will monitor patient

## 2013-12-30 ENCOUNTER — Other Ambulatory Visit: Payer: Self-pay | Admitting: *Deleted

## 2013-12-30 ENCOUNTER — Telehealth: Payer: Self-pay | Admitting: *Deleted

## 2013-12-30 NOTE — Telephone Encounter (Signed)
Call from [place], [place] with [facility] [phone]  Nurse reports she saw pt today and noted increase SOB and increase edema in feet and ankles. Pt is scheduled for [facility] on Thursday. She just wanted you to be aware of changes.  Pt seen yesterday by Cardiothorac PA  There note :  Patient returns for routine postoperative follow-up having undergone Sub Xiphoid Pericardial Window on 12/04/2013. The patient's early postoperative recovery was unremarkable. Since hospital discharge the patient reports she is doing well. She continues to have lower extremity edema. She states overall this is improving with use of Lasix and support hose. She also states that the swelling is relieved when her feet are elevated. She is ambulating with some minimal shortness of breath, and states she is no longer using her home oxygen.  Pt called and she does seem more out of breath with activity today.  She states her feet are a little more swollen but she is wearing compression hose. She feels fine. I offered an appointment today but she can not get a ride over.  She is having the ECHO on Thursday and will come in that afternoon at 1:15.  Pt advised to call us back if she starts feeling any worse or SOB increases.  She agrees.

## 2013-12-30 NOTE — Telephone Encounter (Signed)
I would ask her to come in to the clinic for evaluation if it is worsening. Thank you

## 2013-12-31 MED ORDER — ADULT MULTIVITAMIN W/MINERALS CH: 1.0000 | ORAL_TABLET | Freq: Every day | ORAL | Status: DC

## 2013-12-31 MED ORDER — SODIUM BICARBONATE 650 MG PO TABS: 650.0000 mg | ORAL_TABLET | Freq: Every day | ORAL | Status: DC

## 2014-01-01 ENCOUNTER — Encounter: Payer: Self-pay | Admitting: Internal Medicine

## 2014-01-01 ENCOUNTER — Ambulatory Visit (INDEPENDENT_AMBULATORY_CARE_PROVIDER_SITE_OTHER): Payer: PRIVATE HEALTH INSURANCE | Admitting: Internal Medicine

## 2014-01-01 ENCOUNTER — Ambulatory Visit (HOSPITAL_COMMUNITY): Payer: PRIVATE HEALTH INSURANCE | Attending: Internal Medicine | Admitting: Radiology

## 2014-01-01 ENCOUNTER — Other Ambulatory Visit (HOSPITAL_COMMUNITY): Payer: Self-pay | Admitting: Physician Assistant

## 2014-01-01 VITALS — BP 154/100 | HR 89 | Temp 98.2°F | Ht 61.0 in | Wt 104.3 lb

## 2014-01-01 DIAGNOSIS — R0989 Other specified symptoms and signs involving the circulatory and respiratory systems: Secondary | ICD-10-CM

## 2014-01-01 DIAGNOSIS — I3139 Other pericardial effusion (noninflammatory): Secondary | ICD-10-CM

## 2014-01-01 DIAGNOSIS — Z87891 Personal history of nicotine dependence: Secondary | ICD-10-CM | POA: Insufficient documentation

## 2014-01-01 DIAGNOSIS — R609 Edema, unspecified: Secondary | ICD-10-CM

## 2014-01-01 DIAGNOSIS — I319 Disease of pericardium, unspecified: Secondary | ICD-10-CM | POA: Diagnosis present

## 2014-01-01 DIAGNOSIS — I313 Pericardial effusion (noninflammatory): Secondary | ICD-10-CM

## 2014-01-01 DIAGNOSIS — R600 Localized edema: Secondary | ICD-10-CM

## 2014-01-01 DIAGNOSIS — R0600 Dyspnea, unspecified: Secondary | ICD-10-CM

## 2014-01-01 DIAGNOSIS — R0609 Other forms of dyspnea: Secondary | ICD-10-CM

## 2014-01-01 DIAGNOSIS — I309 Acute pericarditis, unspecified: Secondary | ICD-10-CM

## 2014-01-01 NOTE — Progress Notes (Signed)
Limited Echocardiogram performed for Pericardial Effusion. S/p Subxiphoid Pericardial Window.

## 2014-01-01 NOTE — Progress Notes (Signed)
O2 sat drop to 88% while ambulating on RA; then back to 95% after ambulating.

## 2014-01-01 NOTE — Assessment & Plan Note (Addendum)
Assessment -I suspect some respiratory disease given that she feels she needs home O2, decreased O2 sats while walking (99%->88%), and 20-pack year smoking history.  Plan -Reorder home O2 -Consider work-up of her dyspnea with CXR & PFTs at future  ADDENDUM 01/02/2014 5:26 PM:  -Shauna (Social Work) called into [facility] to clarify. -She has home O2 but is unsure when to use it, and PCP was notified.

## 2014-01-01 NOTE — Progress Notes (Signed)
   Subjective:    [name], female    DOB: 1955-10-22, 58 y.o.   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  She was last seen in the office on 7/23 at which time she was complaining of leg swelling. Her labs were notable for crt 2.7, Mg 1.4, Ca 7.4. Her Lasix was reduced 40->20mg . Her home health nurse called in Tuesday for shortness of breath and leg edema and was scheduled to see me today.   She doesn't feel like her breathing is normal today but is stable. She wonders if it's because of not having home oxygen which she got after being hospitalized since pneumonia. She denies chest pain, swelling elsewhere, new heart rhythm, or weight gain.   Review of Systems  Constitutional: Positive for activity change. Negative for unexpected weight change.  Respiratory: Positive for shortness of breath. Negative for chest tightness.   Cardiovascular: Negative for chest pain and leg swelling.  All other systems reviewed and are negative.      Objective:   Physical Exam  Constitutional: He is oriented to person, place, and time. He appears well-developed and well-nourished. No distress.  HENT:  Head: Normocephalic and atraumatic.  Eyes: Conjunctivae are normal. Pupils are equal, round, and reactive to light.  Cardiovascular: Normal rate, regular rhythm and normal heart sounds.  Exam reveals no gallop and no friction rub.   No murmur heard. Pulmonary/Chest: Effort normal. No respiratory distress. He has no wheezes. He has no rales.  Abdominal: Soft. Bowel sounds are normal. He exhibits no distension. There is no tenderness.  Neurological: He is alert and oriented to person, place, and  time. No cranial nerve deficit. Coordination normal.  Skin: Skin is warm and dry. He is not diaphoretic. 1+ LE edema R > L. Psychiatric: His behavior is normal.         Assessment & Plan:

## 2014-01-01 NOTE — Assessment & Plan Note (Signed)
Assessment -Swelling appears improved as does the shortness of breath at rest. -I suspect this is related to her CHF.  Plan -Continue Lasix 20mg 

## 2014-01-01 NOTE — Patient Instructions (Addendum)
Please continue taking Lasix 20mg . We will refill your O2 prescription.   If you feel more short of breath or swelling in your legs, please call the clinic.   Thank you!

## 2014-01-02 ENCOUNTER — Telehealth: Payer: Self-pay | Admitting: Licensed Clinical Social Worker

## 2014-01-02 NOTE — Telephone Encounter (Signed)
During [name] [name] [facility] appointment on 01/01/14, pt stated she was out of oxygen.  CSW placed call to [place].  Provider states pt should not be out of Home O2, as pt has an emergency backup supply, home concentrator with refill system.  CSW inquired if pt could received additional education on the use of the refill system.  [facility] to call [name] [name] today to inquire as to the issues pt is having utilizing refill system.  No orders from [facility] needed at this time.

## 2014-01-02 NOTE — Telephone Encounter (Signed)
[facility], [name] [name], placed call to [name] [name].  Pt confirmed home O2 is full.  However, pt states "someone" instructed to use Home O2 PRN not to use it until she received another order telling her to use more often.

## 2014-01-05 NOTE — Progress Notes (Signed)
INTERNAL MEDICINE TEACHING ATTENDING ADDENDUM - Aldine Contes, MD: I personally saw and evaluated Ms. Kozinski in this clinic visit in conjunction with the resident, Dr. Posey Pronto. I have discussed patient's plan of care with medical resident during this visit. I have confirmed the physical exam findings and have read and agree with the clinic note including the plan with the following addition: - On exam: Cardio- RRR, normal heart sounds, Lungs- CTA b/l - Patient with O2 desaturation to the 80s on ambulation - Will need home O2 - Consider CXR, PFTs on next visit if persistent symptoms - c/w lasix for now

## 2014-01-14 ENCOUNTER — Ambulatory Visit (INDEPENDENT_AMBULATORY_CARE_PROVIDER_SITE_OTHER): Payer: PRIVATE HEALTH INSURANCE | Admitting: Internal Medicine

## 2014-01-14 ENCOUNTER — Encounter: Payer: Self-pay | Admitting: Internal Medicine

## 2014-01-14 VITALS — BP 133/96 | HR 113 | Temp 97.5°F | Ht 61.0 in | Wt 101.8 lb

## 2014-01-14 DIAGNOSIS — R0609 Other forms of dyspnea: Secondary | ICD-10-CM

## 2014-01-14 DIAGNOSIS — R0989 Other specified symptoms and signs involving the circulatory and respiratory systems: Secondary | ICD-10-CM

## 2014-01-14 DIAGNOSIS — N051 Unspecified nephritic syndrome with focal and segmental glomerular lesions: Secondary | ICD-10-CM

## 2014-01-14 DIAGNOSIS — N032 Chronic nephritic syndrome with diffuse membranous glomerulonephritis: Secondary | ICD-10-CM

## 2014-01-14 DIAGNOSIS — R0600 Dyspnea, unspecified: Secondary | ICD-10-CM

## 2014-01-14 DIAGNOSIS — I5033 Acute on chronic diastolic (congestive) heart failure: Secondary | ICD-10-CM

## 2014-01-14 MED ORDER — ALBUTEROL SULFATE HFA 108 (90 BASE) MCG/ACT IN AERS: 1.0000 | INHALATION_SPRAY | Freq: Four times a day (QID) | RESPIRATORY_TRACT | Status: DC | PRN

## 2014-01-14 NOTE — Assessment & Plan Note (Signed)
-  pt has follow-up with renal

## 2014-01-14 NOTE — Assessment & Plan Note (Signed)
Pt here for follow-up of dyspnea.  She has a h/o diastolic HF and recent echo (1/61/09) shows systolic dysfunction 60-45%.  Has recently been using 4L of oxygen at home intermittently.  Is compliant with medications.  Denies any dyspnea today.  No fever/chills, SOB, orthopnea, PND, LE swelling.  O2 sats with ambulation range from 85-90s.  Has never been diagnosed with COPD but is a former smoker.  I wonder if a large part of her dyspnea is related to COPD.  I am not able to find any prior PFTs.  However, CXR suggests emphysematous changes.  She reports wheezing on occasion.  On exam, lungs are CTAB without any signs of volume overload.   -ordered PFTs today -prescribed albuterol inhaler PRN -continue to use 2L of O2 at home -reminded pt not to smoke or have flames around oxygen as it is very flammable -demonstrated how to properly use inhaler -continue all medications as directed  -follow up with PCP

## 2014-01-14 NOTE — Progress Notes (Signed)
[name], female   DOB: 1955-10-21, 58 y.o.   MRN: [id number]    Subjective:   [name] female    DOB: 04-23-1956 58 y.o.    MRN: [id number] Health Maintenance Due: Health Maintenance Due  Topic Date Due  . Influenza Vaccine  01/03/2014    ________________________________________________________________  HPI: [name] is a 58 y.o. female here for a follow-up visit.  Pt has a PMH outlined below.  Please see problem-based charting assessment and plan note for further details of medical issues addressed at today's visit.  PMH: Past Medical History  Diagnosis Date  . Anemia, B12 deficiency   . History of acute pancreatitis   . Right knee pain     No recent imaging on chart  . 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 Dr. Mancel Bale, [facility]  . Hypertriglyceridemia   . GERD (gastroesophageal reflux disease)   . Vitamin D deficiency   . Subdural hematoma 02/2008    Likely 2/2 trauma from seizure from EtOH withdrawal, chronic in nature, sees Dr. Jerene Bears. Most recent CT head 10/2009 showing stable but persistent hematoma without mass effect.  . History of seizure disorder     Likely 2/2 alcohol abuse  . Hypocalcemia   . Hypomagnesemia   . Failure to thrive in childhood     Unclear etiology  . HTN (hypertension)   . Thrombocytopenia   . Hepatomegaly     On exam  . Joint pain   . Alcohol abuse   . Vitamin D deficiency   . Pancreatitis   . Insomnia   . Hyperlipidemia   . Pernicious anemia   . Macrocytic anemia   . 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 Depression   . Fx humeral neck 04/17/2011    Transverse fracture- minimally displaced- managed as outpatient   . ABNORMAL PAP SMEAR, LGSIL 07/23/2008    Annotation: HPV positive CIN I Dr. Mancel Bale, [facility] Qualifier: Diagnosis of  By: Oretha Ellis    . Pneumonia 05/20/2012  . Arthritis      "shoulders" (08/15/2013)  . 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  Marland Kitchen Chronic diastolic CHF (congestive heart failure)   . Hypomagnesemia     Medications: Current Outpatient Prescriptions on File Prior to Visit  Medication Sig Dispense Refill  . acetaminophen (TYLENOL) 500 MG tablet Take 1 tablet (500 mg total) by mouth every 4 (four) hours as needed for mild pain, moderate pain or headache.  30 tablet  0  . amLODipine (NORVASC) 5 MG tablet Take 1 tablet (5 mg total) by mouth daily.  30 tablet  3  . calcium carbonate (TUMS) 500 MG chewable tablet Chew 6 tablets (1,200 mg of elemental calcium total) by mouth 3 (three) times daily. For bone health  90 tablet  3  . cetirizine (ZYRTEC) 10 MG tablet Take 10 mg by mouth daily as needed for allergies.      Marland Kitchen colchicine 0.6 MG tablet Take 0.5 tablets (0.3 mg total) by mouth daily.  40 tablet  0  . feeding supplement, ENSURE COMPLETE, (ENSURE COMPLETE) LIQD Take 237 mLs by mouth 3 (three) times daily between meals.  90 Bottle  1  . FLUoxetine (PROZAC) 10 MG capsule Take 1 capsule (10 mg total) by mouth daily. For depression  90 capsule  4  . folic acid (FOLVITE) 1 MG tablet Take 1 tablet (1 mg total) by mouth daily. For folic acid replacement  30 tablet  3  . furosemide (LASIX) 40 MG tablet Take 0.5 tablets (20 mg total) by mouth daily.  30 tablet  2  . gabapentin (NEURONTIN) 300 MG capsule Take 2 capsules (600 mg total) by mouth 3 (three) times daily. For anxiety/pain control  540 capsule  1  . lipase/protease/amylase (CREON-12/PANCREASE) 12000 UNITS CPEP capsule Take 1 capsule by mouth 3 (three) times daily before meals.  270 capsule  3  . magnesium oxide (MAG-OX) 400 MG tablet Take 400 mg by mouth 4 (four) times daily.      . metoprolol tartrate (LOPRESSOR) 25 MG tablet Take 12.5 mg by mouth 2 (two) times daily.      . Multiple Vitamin (MULTIVITAMIN WITH MINERALS) TABS tablet Take 1 tablet by mouth daily. For  vitamin replacement  90 tablet  4  . omeprazole (PRILOSEC) 40 MG capsule Take 1 capsule (40 mg total) by mouth daily.  90 capsule  4  . PREDNISONE PO Take by mouth. As instructed previously      . sodium bicarbonate 650 MG tablet Take 1 tablet (650 mg total) by mouth daily.  30 tablet  3  . thiamine (VITAMIN B-1) 100 MG tablet Take 1 tablet (100 mg total) by mouth daily. For low thiamine  90 tablet  3  . vitamin B-12 (CYANOCOBALAMIN) 250 MCG tablet Take 1 tablet (250 mcg total) by mouth every evening.  90 tablet  3  . Vitamin D, Ergocalciferol, (DRISDOL) 50000 UNITS CAPS capsule Take 1 capsule (50,000 Units total) by mouth every 7 (seven) days. Thursdays: For bone health  4 capsule  3   No current facility-administered medications on file prior to visit.    Allergies: Allergies  Allergen Reactions  . Amitriptyline Hcl Swelling    In the face.  . Doxycycline Hyclate Itching    Feels like something crawling under her skin    FH: Family History  Problem Relation Age of Onset  . Cancer Mother     Died from stomach cancer and "flesh eating rash  . Heart failure Father     Died in 81s from an MI  . Alcohol abuse Sister     Twin sister drinks a lot, as did both her parents and brothers  . Stroke Brother     Has 7 brothers, 1 with CVA  . Lupus Mother     SH: History   Social History  . Marital Status: Divorced    Spouse Name: N/A    Number of Children: N/A  . Years of Education: N/A   Social History Main Topics  . Smoking status: Former Smoker -- 0.50 packs/day for 40 years    Types: Cigarettes    Quit date: 09/20/2010  . Smokeless tobacco: Never Used  . Alcohol Use: No  . Drug Use: No     Comment: 08/15/2013 "last drug use was in 2012"  . Sexual Activity: None   Other Topics Concern  . None   Social History Narrative   Lives with her significant other and 2 grandchildren. 1 child   Has 7 brothers and 4 sisters, 1 twin sister.   Unemployed, worked in [employer].     Abuses alcohol-drinks 1 glass of wine daily    No drug use. Former cigarette use quit 1.5 years ago.     11 th grade education  Review of Systems: Constitutional: Negative for fever, chills and weight loss.  Eyes: Negative for blurred vision.  Respiratory: Negative for cough and shortness of breath.  Cardiovascular: Negative for chest pain, palpitations and leg swelling.  Gastrointestinal: Negative for nausea, vomiting, abdominal pain, diarrhea, constipation and blood in stool.  Genitourinary: Negative for dysuria, urgency and frequency.  Musculoskeletal: Negative for myalgias and back pain.  Neurological: Negative for dizziness, weakness and headaches.     Objective:   Vital Signs: Filed Vitals:   01/14/14 0954  BP: 133/96  Pulse: 113  Temp: 97.5 F (36.4 C)  TempSrc: Oral  Height: 5\' 1"  (1.549 m)  Weight: 101 lb 12.8 oz (46.176 kg)  SpO2: 97%      BP Readings from Last 3 Encounters:  01/14/14 133/96  01/01/14 154/100  12/29/13 143/96    Physical Exam: Constitutional: Vital signs reviewed.  Patient is a thin female appearing older than her stated age. Head: Normocephalic and atraumatic. Eyes: PERRL, EOMI, conjunctivae nl, no scleral icterus.  Neck: Supple. Cardiovascular: RRR, no MRG. Pulmonary/Chest: normal effort, non-tender to palpation, CTAB, no wheezes, rales, or rhonchi. Abdominal: Thin. Soft. NT/ND +BS. Neurological: A&O x3, cranial nerves II-XII are grossly intact, moving all extremities. Extremities: 2+DP b/l; no pitting edema. Skin: Warm, dry and intact. No rash.  Most Recent Laboratory Results:  CMP     Component Value Date/Time   NA 139 12/25/2013 0846   K 4.9 12/25/2013 0846   CL 102 12/25/2013 0846   CO2 19 12/25/2013 0846   GLUCOSE 151* 12/25/2013 0846   BUN 43* 12/25/2013 0846   CREATININE 2.70* 12/25/2013 0846   CREATININE 2.89* 12/18/2013 0545   CALCIUM 7.4* 12/25/2013 0846   CALCIUM 5.6* 12/22/2010 1010   PROT 7.1 12/06/2013 0345     ALBUMIN 2.6* 12/18/2013 0545   AST 25 12/06/2013 0345   ALT 15 12/06/2013 0345   ALKPHOS 241* 12/06/2013 0345   BILITOT <0.2* 12/06/2013 0345   GFRNONAA 19* 12/25/2013 0846   GFRNONAA 17* 12/18/2013 0545   GFRAA 22* 12/25/2013 0846   GFRAA 20* 12/18/2013 0545    CBC    Component Value Date/Time   WBC 7.5 12/18/2013 0545   WBC 7.2 10/26/2011 1336   RBC 2.93* 12/18/2013 0545   RBC 2.49* 08/09/2012 1040   RBC 2.98* 10/26/2011 1336   HGB 8.9* 12/18/2013 0545   HGB 9.7* 10/26/2011 1336   HCT 29.0* 12/18/2013 0545   HCT 28.8* 10/26/2011 1336   PLT 332 12/18/2013 0545   PLT 125* 10/26/2011 1336   MCV 99.0 12/18/2013 0545   MCV 96.6 10/26/2011 1336   MCH 30.4 12/18/2013 0545   MCH 32.6 10/26/2011 1336   MCHC 30.7 12/18/2013 0545   MCHC 33.7 10/26/2011 1336   RDW 15.5 12/18/2013 0545   RDW 13.6 10/26/2011 1336   LYMPHSABS 0.9 12/17/2013 1356   LYMPHSABS 2.7 10/26/2011 1336   MONOABS 0.2 12/17/2013 1356   MONOABS 0.8 10/26/2011 1336   EOSABS 0.0 12/17/2013 1356   EOSABS 0.1 10/26/2011 1336   BASOSABS 0.0 12/17/2013 1356   BASOSABS 0.0 10/26/2011 1336    Lipid Panel Lab Results  Component Value Date   CHOL 99 12/04/2012   HDL 30* 03/26/2012   LDLCALC 20 03/26/2012   TRIG 301* 03/26/2012   CHOLHDL 3.7 03/26/2012    HA1C Lab Results  Component Value Date   HGBA1C 5.4 11/28/2013    Urinalysis    Component Value Date/Time   COLORURINE YELLOW 11/29/2013 0203  APPEARANCEUR CLEAR 11/29/2013 0203   LABSPEC 1.010 11/29/2013 0203   PHURINE 6.0 11/29/2013 0203   GLUCOSEU NEGATIVE 11/29/2013 0203   GLUCOSEU NEG mg/dL 08/15/2007 2117   HGBUR NEGATIVE 11/29/2013 0203   BILIRUBINUR NEGATIVE 11/29/2013 0203   KETONESUR NEGATIVE 11/29/2013 0203   PROTEINUR NEGATIVE 11/29/2013 0203   UROBILINOGEN 0.2 11/29/2013 0203   NITRITE NEGATIVE 11/29/2013 0203   LEUKOCYTESUR NEGATIVE 11/29/2013 0203    Urine Microalbumin No results found for this basename: MICROALBUR,  [id number]    Imaging N/A   Assessment & Plan:    Assessment and plan was discussed and formulated with my attending.

## 2014-01-14 NOTE — Assessment & Plan Note (Addendum)
Pt recent echo shows systolic function as well with EF 40-45%. No s/s of fluid overload today. -continue current management

## 2014-01-14 NOTE — Patient Instructions (Signed)
Thank you for your visit today.   Please return to the internal medicine clinic in 4-8 weeks or sooner if needed.     Your current medical regimen is effective;  continue present plan and take all medications as prescribed.    I have made the following additions/changes to your medications: Added albuterol inhaler--please use this when you get short of breath or wheezing--only use every 6 hours as needed.   I have made the following referrals for you: pulmonary function test to test your lungs. Please use 2L of oxygen at home continuously--DO not smoke or have flames around your oxygen.   Please be sure to bring all of your medications with you to every visit; this includes herbal supplements, vitamins, eye drops, and any over-the-counter medications.   Should you have any questions regarding your medications and/or any new or worsening symptoms, please be sure to call the clinic at [phone].   If you believe that you are suffering from a life threatening condition or one that may result in the loss of limb or function, then you should call 911 or proceed to the nearest Emergency Department.     Albuterol inhalation aerosol What is this medicine? ALBUTEROL (al Normajean [place]) is a bronchodilator. It helps open up the airways in your lungs to make it easier to breathe. This medicine is used to treat and to prevent bronchospasm. This medicine may be used for other purposes; ask your health care provider or pharmacist if you have questions. COMMON BRAND NAME(S): Proair HFA, Proventil, Proventil HFA, Respirol, Ventolin, Ventolin HFA What should I tell my health care provider before I take this medicine? They need to know if you have any of the following conditions: -diabetes -heart disease or irregular heartbeat -high blood pressure -pheochromocytoma -seizures -thyroid disease -an unusual or allergic reaction to albuterol, levalbuterol, sulfites, other medicines, foods, dyes, or  preservatives -pregnant or trying to get pregnant -breast-feeding How should I use this medicine? This medicine is for inhalation through the mouth. Follow the directions on your prescription label. Take your medicine at regular intervals. Do not use more often than directed. Make sure that you are using your inhaler correctly. Ask you doctor or health care provider if you have any questions. Talk to your pediatrician regarding the use of this medicine in children. Special care may be needed. Overdosage: If you think you have taken too much of this medicine contact a poison control center or emergency room at once. NOTE: This medicine is only for you. Do not share this medicine with others. What if I miss a dose? If you miss a dose, use it as soon as you can. If it is almost time for your next dose, use only that dose. Do not use double or extra doses. What may interact with this medicine? -anti-infectives like chloroquine and pentamidine -caffeine -cisapride -diuretics -medicines for colds -medicines for depression or for emotional or psychotic conditions -medicines for weight loss including some herbal products -methadone -some antibiotics like clarithromycin, erythromycin, levofloxacin, and linezolid -some heart medicines -steroid hormones like dexamethasone, cortisone, hydrocortisone -theophylline -thyroid hormones This list may not describe all possible interactions. Give your health care provider a list of all the medicines, herbs, non-prescription drugs, or dietary supplements you use. Also tell them if you smoke, drink alcohol, or use illegal drugs. Some items may interact with your medicine. What should I watch for while using this medicine? Tell your doctor or health care professional if your symptoms do not  improve. Do not use extra albuterol. If your asthma or bronchitis gets worse while you are using this medicine, call your doctor right away. If your mouth gets dry try  chewing sugarless gum or sucking hard candy. Drink water as directed. What side effects may I notice from receiving this medicine? Side effects that you should report to your doctor or health care professional as soon as possible: -allergic reactions like skin rash, itching or hives, swelling of the face, lips, or tongue -breathing problems -chest pain -feeling faint or lightheaded, falls -high blood pressure -irregular heartbeat -fever -muscle cramps or weakness -pain, tingling, numbness in the hands or feet -vomiting Side effects that usually do not require medical attention (report to your doctor or health care professional if they continue or are bothersome): -cough -difficulty sleeping -headache -nervousness or trembling -stomach upset -stuffy or runny nose -throat irritation -unusual taste This list may not describe all possible side effects. Call your doctor for medical advice about side effects. You may report side effects to FDA at 1-800-FDA-1088. Where should I keep my medicine? Keep out of the reach of children. Store at room temperature between 15 and 30 degrees C (59 and 86 degrees F). The contents are under pressure and may burst when exposed to heat or flame. Do not freeze. This medicine does not work as well if it is too cold. Throw away any unused medicine after the expiration date. Inhalers need to be thrown away after the labeled number of puffs have been used or by the expiration date; whichever comes first. Ventolin HFA should be thrown away 12 months after removing from foil pouch. Check the instructions that come with your medicine. NOTE: This sheet is a summary. It may not cover all possible information. If you have questions about this medicine, talk to your doctor, pharmacist, or health care provider.  2015, Elsevier/Gold Standard. (2012-11-07 10:57:17)   Oxygen Use at Home Oxygen can be prescribed for home use. The prescription will show the flow rate. This  is how much oxygen is to be used per minute. This will be listed in liters per minute (LPM or L/M). A liter is a metric measurement of volume. You will use oxygen therapy as directed. It can be used while exercising, sleeping, or at rest. You may need oxygen continuously. Your health care provider may order a blood oxygen test (arterial blood gas or pulse oximetry test) that will show what your oxygen level is. Your health care provider will use these measurements to learn about your needs and follow your progress. Home oxygen therapy is commonly used on patients with various lung (pulmonary) related conditions. Some of these conditions include:  Asthma.  Lung cancer.  Pneumonia.  Emphysema.  Chronic bronchitis.  Cystic fibrosis.  Other lung diseases.  Pulmonary fibrosis.  Occupational lung disease.  Heart failure.  Chronic obstructive pulmonary disease (COPD). 3 COMMON WAYS OF PROVIDING OXYGEN THERAPY  Gas: The gas form of oxygen is put into variously sized cylinders or tanks. The cylinders or oxygen tanks contain compressed oxygen. The cylinder is equipped with a regulator that controls the flow rate. Because the flow of oxygen out of the cylinder is constant, an oxygen conserving device may be attached to the system to avoid waste. This device releases the gas only when you inhale and cuts it off when you exhale. Oxygen can be provided in a small cylinder that can be carried with you. Large tanks are heavy and are only for stationary use. After  use, empty tanks must be exchanged for full tanks.  Liquid: The liquid form of oxygen is put into a container similar to a thermos. When released, the liquid converts to a gas and you breathe it in just like the compressed gas. This storage method takes up less space than the compressed gas cylinder, and you can transfer the liquid to a small, portable vessel at home. Liquid oxygen is more expensive than the compressed gas, and the vessel vents  when not in use. An oxygen conserving device may be built into the vessel to conserve the oxygen. Liquid oxygen is very cold, around 297 below zero.  Oxygen concentrator: This medical device filters oxygen from room air and gives almost 100% oxygen to the patient. Oxygen concentrators are powered by electricity. Benefits of this system are:  It does not need to be resupplied.  It is not as costly as liquid oxygen.  Extra tubing permits the user to move around easier. There are several types of small, portable oxygen systems available which can help you remain active and mobile. You must have a cylinder of oxygen as a backup in the event of a power failure. Advise your electric power company that you are on oxygen therapy in order to get priority service when there is a power failure. OXYGEN DELIVERY DEVICES There are 3 common ways to deliver oxygen to your body.  Nasal cannula. This is a 2-pronged device inserted in the nostrils that is connected to tubing carrying the oxygen. The tubing can rest on the ears or be attached to the frame of eyeglasses.  Mask. People who need a high flow of oxygen generally use a mask.  Transtracheal catheter. Transtracheal oxygen therapy requires the insertion of a small, flexible tube (catheter) in the windpipe (trachea). This catheter is held in place by a necklace. Since transtracheal oxygen bypasses the mouth, nose, and throat, a humidifier is absolutely required at flow rates of 1 LPM or greater. OXYGEN USE SAFETY TIPS  Never smoke while using oxygen. Oxygen does not burn or explode, but flammable materials will burn faster in the presence of oxygen.  Keep a Data processing manager close by. Let your fire department know that you have oxygen in your home.  Warn visitors not to smoke near you when you are using oxygen. Put up "no smoking" signs in your home where you most often use the oxygen.  When you go to a restaurant with your portable oxygen source, ask  to be seated in the nonsmoking section.  Stay at least 5 feet away from gas stoves, candles, lighted fireplaces, or other heat sources.  Do not use materials that burn easily (flammable) while using your oxygen.  If you use an oxygen cylinder, make sure it is secured to some fixed object or in a stand. If you use liquid oxygen, make sure the vessel is kept upright to keep the oxygen from pouring out. Liquid oxygen is so cold it can hurt your skin.  If you use an oxygen concentrator, call your electric company so you will be given priority service if your power goes out. Avoid using extension cords, if possible.  Regularly test your smoke detectors at home to make sure they work. If you receive care in your home from a nurse or other health care provider, he or she may also check to make sure your smoke detectors work. GUIDELINES FOR CLEANING YOUR EQUIPMENT  Wash the nasal prongs with a liquid soap. Thoroughly rinse them once or  twice a week.  Replace the prongs every 2 to 4 weeks. If you have an infection (cold, pneumonia) change them when you are well.  Your health care provider will give you instructions on how to clean your transtracheal catheter.  The humidifier bottle should be washed with soap and warm water and rinsed thoroughly between each refill. Air-dry the bottle before filling it with sterile or distilled water. The bottle and its top should be disinfected after they are cleaned.  If you use an oxygen concentrator, unplug the unit. Then wipe down the cabinet with a damp cloth and dry it daily. The air filter should be cleaned at least twice a week.  Follow your home medical equipment and service company's directions for cleaning the compressor filter. HOME CARE INSTRUCTIONS   Do not change the flow of oxygen unless directed by your health care provider.  Do not use alcohol or other sedating drugs unless instructed. They slow your breathing rate.  Do not use materials that  burn easily (flammable) while using your oxygen.  Always keep a spare tank of oxygen. Plan ahead for holidays when you may not be able to get a prescription filled.  Use water-based lubricants on your lips or nostrils. Do not use an oil-based product like petroleum jelly.  To prevent your cheeks or the skin behind your ears from becoming irritated, tuck some gauze under the tubing.  If you have persistent redness under your nose, call your health care provider.  When you no longer need oxygen, your doctor will have the oxygen discontinued. Oxygen is not addicting or habit forming.  Use the oxygen as instructed. Too much oxygen can be harmful and too little will not give you the benefit you need.  Shortness of breath is not always from a lack of oxygen. If your oxygen level is not the cause of your shortness of breath, taking oxygen will not help. SEEK MEDICAL CARE IF:   You have frequent headaches.  You have shortness of breath or a lasting cough.  You have anxiety.  You are confused.  You are drowsy or sleepy all the time.  You develop an illness which aggravates your breathing.  You cannot exercise.  You are restless.  You have blue lips or fingernails.  You have difficult or irregular breathing and it is getting worse.  You have a fever. Document Released: 08/12/2003 Document Revised: 10/06/2013 Document Reviewed: 01/01/2013 [facility] Patient Information 2015 [place], [place]. This information is not intended to replace advice given to you by your health care provider. Make sure you discuss any questions you have with your health care provider.        A healthy lifestyle and preventative care can promote health and wellness.   Maintain regular health, dental, and eye exams.  Eat a healthy diet. Foods like vegetables, fruits, whole grains, low-fat dairy products, and lean protein foods contain the nutrients you need without too many calories. Decrease your intake  of foods high in solid fats, added sugars, and salt. Get information about a proper diet from your caregiver, if necessary.  Regular physical exercise is one of the most important things you can do for your health. Most adults should get at least 150 minutes of moderate-intensity exercise (any activity that increases your heart rate and causes you to sweat) each week. In addition, most adults need muscle-strengthening exercises on 2 or more days a week.   Maintain a healthy weight. The body mass index (BMI) is a screening  tool to identify possible weight problems. It provides an estimate of body fat based on height and weight. Your caregiver can help determine your BMI, and can help you achieve or maintain a healthy weight. For adults 20 years and older:  A BMI below 18.5 is considered underweight.  A BMI of 18.5 to 24.9 is normal.  A BMI of 25 to 29.9 is considered overweight.  A BMI of 30 and above is considered obese.

## 2014-01-15 NOTE — Progress Notes (Signed)
INTERNAL MEDICINE TEACHING ATTENDING ADDENDUM - Chancey Ringel, MD: I reviewed and discussed at the time of visit with the resident Dr. Gill, the patient's medical history, physical examination, diagnosis and results of pertinent tests and treatment and I agree with the patient's care as documented.  

## 2014-01-16 LAB — AFB CULTURE WITH SMEAR (NOT AT ARMC): ACID FAST SMEAR: NONE SEEN

## 2014-01-19 ENCOUNTER — Ambulatory Visit: Payer: PRIVATE HEALTH INSURANCE | Admitting: Nurse Practitioner

## 2014-01-19 ENCOUNTER — Ambulatory Visit: Payer: Self-pay | Admitting: Nurse Practitioner

## 2014-01-23 ENCOUNTER — Other Ambulatory Visit: Payer: Self-pay | Admitting: *Deleted

## 2014-01-23 DIAGNOSIS — I10 Essential (primary) hypertension: Secondary | ICD-10-CM

## 2014-01-23 MED ORDER — AMLODIPINE BESYLATE 5 MG PO TABS: 5.0000 mg | ORAL_TABLET | Freq: Every day | ORAL | Status: DC

## 2014-01-23 NOTE — Telephone Encounter (Signed)
Fax from [facility] - requesting a 90 day supply.  Thanks

## 2014-01-27 ENCOUNTER — Other Ambulatory Visit: Payer: Self-pay | Admitting: *Deleted

## 2014-01-28 ENCOUNTER — Other Ambulatory Visit: Payer: Self-pay | Admitting: *Deleted

## 2014-01-28 MED ORDER — LISINOPRIL 10 MG PO TABS: 10.0000 mg | ORAL_TABLET | Freq: Every day | ORAL | Status: DC

## 2014-01-28 MED ORDER — FUROSEMIDE 40 MG PO TABS: 20.0000 mg | ORAL_TABLET | Freq: Every day | ORAL | Status: DC

## 2014-01-28 NOTE — Telephone Encounter (Signed)
Pt also request Ferrous Sulfate 325 mg take 1 BID Aspirin EC low dose 81 mg daily

## 2014-01-29 MED ORDER — GABAPENTIN 300 MG PO CAPS: 600.0000 mg | ORAL_CAPSULE | Freq: Three times a day (TID) | ORAL | Status: DC

## 2014-01-29 MED ORDER — CETIRIZINE HCL 10 MG PO TABS: 10.0000 mg | ORAL_TABLET | Freq: Every day | ORAL | Status: DC | PRN

## 2014-03-03 ENCOUNTER — Other Ambulatory Visit: Payer: Self-pay | Admitting: *Deleted

## 2014-03-05 MED ORDER — FOLIC ACID 1 MG PO TABS: 1.0000 mg | ORAL_TABLET | Freq: Every day | ORAL | Status: DC

## 2014-03-05 MED ORDER — CETIRIZINE HCL 10 MG PO TABS: 10.0000 mg | ORAL_TABLET | Freq: Every day | ORAL | Status: DC | PRN

## 2014-03-25 ENCOUNTER — Other Ambulatory Visit: Payer: Self-pay | Admitting: *Deleted

## 2014-03-26 MED ORDER — PANCRELIPASE (LIP-PROT-AMYL) 12000-38000 UNITS PO CPEP: 12000.0000 [IU] | ORAL_CAPSULE | Freq: Three times a day (TID) | ORAL | Status: DC

## 2014-03-26 MED ORDER — FLUOXETINE HCL 10 MG PO CAPS: 10.0000 mg | ORAL_CAPSULE | Freq: Every day | ORAL | Status: DC

## 2014-03-26 MED ORDER — OMEPRAZOLE 40 MG PO CPDR: 40.0000 mg | DELAYED_RELEASE_CAPSULE | Freq: Every day | ORAL | Status: DC

## 2014-03-26 MED ORDER — VITAMIN D (ERGOCALCIFEROL) 1.25 MG (50000 UNIT) PO CAPS: 50000.0000 [IU] | ORAL_CAPSULE | ORAL | Status: DC

## 2014-03-26 NOTE — Telephone Encounter (Signed)
Will need to check vitamin D levels at her next clinic visit

## 2014-03-27 ENCOUNTER — Ambulatory Visit (INDEPENDENT_AMBULATORY_CARE_PROVIDER_SITE_OTHER): Payer: PRIVATE HEALTH INSURANCE | Admitting: *Deleted

## 2014-03-27 DIAGNOSIS — Z23 Encounter for immunization: Secondary | ICD-10-CM

## 2014-04-23 ENCOUNTER — Other Ambulatory Visit: Payer: Self-pay | Admitting: *Deleted

## 2014-04-23 MED ORDER — SODIUM BICARBONATE 650 MG PO TABS: 650.0000 mg | ORAL_TABLET | Freq: Every day | ORAL | Status: DC

## 2014-05-06 ENCOUNTER — Ambulatory Visit (INDEPENDENT_AMBULATORY_CARE_PROVIDER_SITE_OTHER): Payer: PRIVATE HEALTH INSURANCE | Admitting: Internal Medicine

## 2014-05-06 ENCOUNTER — Encounter: Payer: Self-pay | Admitting: Internal Medicine

## 2014-05-06 VITALS — BP 115/72 | HR 100 | Temp 98.4°F | Ht 61.0 in | Wt 85.6 lb

## 2014-05-06 DIAGNOSIS — I129 Hypertensive chronic kidney disease with stage 1 through stage 4 chronic kidney disease, or unspecified chronic kidney disease: Secondary | ICD-10-CM

## 2014-05-06 DIAGNOSIS — N184 Chronic kidney disease, stage 4 (severe): Secondary | ICD-10-CM

## 2014-05-06 DIAGNOSIS — D631 Anemia in chronic kidney disease: Secondary | ICD-10-CM

## 2014-05-06 DIAGNOSIS — N189 Chronic kidney disease, unspecified: Secondary | ICD-10-CM

## 2014-05-06 DIAGNOSIS — R05 Cough: Secondary | ICD-10-CM

## 2014-05-06 DIAGNOSIS — I5042 Chronic combined systolic (congestive) and diastolic (congestive) heart failure: Secondary | ICD-10-CM

## 2014-05-06 DIAGNOSIS — R059 Cough, unspecified: Secondary | ICD-10-CM | POA: Insufficient documentation

## 2014-05-06 MED ORDER — GUAIFENESIN-DM 100-10 MG/5ML PO SYRP: 5.0000 mL | ORAL_SOLUTION | ORAL | Status: DC | PRN

## 2014-05-06 NOTE — Assessment & Plan Note (Signed)
Pt followed at [facility] and saw Dr. Justin Mend 04/21/14. Renal function stable and slightly improved at that visit with Cr 2.26 and BUN 15. She will likely need HD in the future, which has been discussed with her by Dr. Justin Mend. She is to f/u in approx 3 mo with Dr. Justin Mend.

## 2014-05-06 NOTE — Patient Instructions (Signed)
**You can take the Robitussin every 4 hours for your cough. **If the cough does not improve or worsens, please call the clinic  Dextromethorphan; Guaifenesin oral solution What is this medicine? DEXTROMETHORPHAN; GUAIFENESIN (dex troe meth OR fan; gwye FEN e sin) is a combination of a cough suppressant and expectorant. It is used for the temporary relief of coughs. This medicine is also used to loosen mucus. This medicine may be used for other purposes; ask your health care provider or pharmacist if you have questions. COMMON BRAND NAME(S): Altarussin DM, Aquatab DM, Cheracol D, Delsym Children's Cough + Chest Congestion DM, Delsym Cough + Chest Congestion DM, Dex-Tuss DM, Diabetic Tussin DM, DM/GUAI, Dometuss DM, Drituss DM, Duraganidin DM, Duratuss DM, Gani-Tuss DM NR, Genatuss DM, Guai-Dex, Guiadrine DX, Guiatuss DM, H-T Tussin, Hydro-Tussin DM, Iophen DM-NR, Mucinex Children's Cough, Mucinex Fast-Max DM Max, Mucus Children's Cough, Naldecon, Nalspan Senior DX, Nortuss EX, Orgadin-Tuss DM, PediaCare Cough & Congestion, Pulexn DM, Q-Tussin DM, Robafen DM, Robafen DM Clear, Robafen DM Max, Robitussin Adult Peak Cold, Robitussin Cough and Congestion, Robitussin DM, Scot-Tussin Senior, Siltussin DM DAS, Siltussin-DM, Siltussin-DM Diabetic DAS-Na, Siltussin-DM Diabetic DAS-Na Maximum Strength, Simuc-DM, Su-Tuss DM, Triaminic Cough & Congestion, Tussi-Organidin DM NR, Tussiden DM, Tussidin DM NR, Vicks DayQuil Mucus Control DM, Vicks DayQuil Nature Fusion, Vicks Formula 44, Vicks Formula 44E, Vicks Nature Fusion Cough & Chest Congestion What should I tell my health care provider before I take this medicine? They need to know if you have any of these conditions: -chronic bronchitis -kidney disease -liver disease -lung or breathing disease, like asthma or emphysema -unable to sit up -an unusual or allergic reaction to dextromethorphan, guaifenesin, other medicines, foods, dyes, bromides, or  preservatives -pregnant or trying to get pregnant -breast-feeding How should I use this medicine? Take this medicine by mouth with a full glass of water. Follow the directions on the prescription label. Use a specially marked spoon or container to measure your dose. Household spoons are not accurate. Take your medicine at regular intervals. Do not take it more often than directed. Talk to your pediatrician regarding the use of this medicine in children. Special care may be needed. Overdosage: If you think you have taken too much of this medicine contact a poison control center or emergency room at once. NOTE: This medicine is only for you. Do not share this medicine with others. What if I miss a dose? If you miss a dose, take it as soon as you can. If it is almost time for your next dose, take only that dose. Do not take double or extra doses. What may interact with this medicine? Do not take this medicine with any of the following medications: -MAOIs like Carbex, Eldepryl, Marplan, Nardil, and Parnate -procarbazine This medicine may also interact with the following medications: -other medicines for colds or allergy -medicines for depression or other mental disturbances This list may not describe all possible interactions. Give your health care provider a list of all the medicines, herbs, non-prescription drugs, or dietary supplements you use. Also tell them if you smoke, drink alcohol, or use illegal drugs. Some items may interact with your medicine. What should I watch for while using this medicine? Do not treat yourself for a cough for more than 1 week without consulting your doctor or health care professional. If you have a high fever, skin rash, lasting headache, or sore throat, see your doctor. Drink 6 to 8 glasses of water daily while you are taking this medicine to help  loosen mucus. You may get drowsy or dizzy. Do not drive, use machinery, or do anything that needs mental alertness  until you know how this medicine affects you. Do not stand or sit up quickly, especially if you are an older patient. This reduces the risk of dizzy or fainting spells. Alcohol may interfere with the effect of this medicine. Avoid alcoholic drinks. What side effects may I notice from receiving this medicine? Side effects that you should report to your doctor or health care professional as soon as possible: -allergic reactions like skin rash, itching or hives, swelling of the face, lips, or tongue -breathing problems -confusion -excitement, nervousness, restlessness, or irritability Side effects that usually do not require medical attention (report to your doctor or health care professional if they continue or are bothersome): -headache -stomach upset This list may not describe all possible side effects. Call your doctor for medical advice about side effects. You may report side effects to FDA at 1-800-FDA-1088. Where should I keep my medicine? Keep out of the reach of children. Store at room temperature between 20 and 25 degrees C (68 and 77 degrees F). Keep bottle tightly closed. Throw away any unused medicine after the expiration date. NOTE: This sheet is a summary. It may not cover all possible information. If you have questions about this medicine, talk to your doctor, pharmacist, or health care provider.  2015, Elsevier/Gold Standard. (2007-09-12 17:34:44)  General Instructions:   Please bring your medicines with you each time you come to clinic.  Medicines may include prescription medications, over-the-counter medications, herbal remedies, eye drops, vitamins, or other pills.   Progress Toward Treatment Goals:  Treatment Goal 12/25/2013  Blood pressure at goal  Prevent falls -    Self Care Goals & Plans:  Self Care Goal 05/06/2014  Manage my medications take my medicines as prescribed; bring my medications to every visit; refill my medications on time; follow the sick day  instructions if I am sick  Monitor my health keep track of my weight  Eat healthy foods eat more vegetables; eat fruit for snacks and desserts; eat foods that are low in salt  Be physically active find an activity I enjoy  Meeting treatment goals -    No flowsheet data found.   Care Management & Community Referrals:  Referral 12/25/2013  Referrals made for care management support -  Referrals made to community resources none

## 2014-05-06 NOTE — Assessment & Plan Note (Addendum)
No signs of volume overload today. Pt taking her Lasix 20mg  daily; will continue.

## 2014-05-06 NOTE — Assessment & Plan Note (Signed)
BP Readings from Last 3 Encounters:  05/06/14 115/72  01/14/14 133/96  01/01/14 154/100    Lab Results  Component Value Date   NA 139 12/25/2013   K 4.9 12/25/2013   CREATININE 2.70* 12/25/2013    Assessment: Blood pressure control: controlled Progress toward BP goal:  at goal  Plan: Medications:  continue current medications: Amlodipine 5mg  qday, Lasix 20mg  qday, Lisinopril 10mg  qday, and Lopressor 12.5mg  BID Educational resources provided: brochure, handout, video Self management tools provided:   Other plans: F/u in 6 mo

## 2014-05-06 NOTE — Assessment & Plan Note (Signed)
Pt with anemia of chronic disease. Baseline Hgb ~8. Hgb checked at Nephrology office 11/17 and was 8.8. Will continue to monitor.

## 2014-05-06 NOTE — Progress Notes (Signed)
[name], female   DOB: 1955-12-15, 58 y.o.   MRN: [id number]  Subjective:   [name] female   DOB: [birth year] 58 y.o.   MRN: [id number]  HPI: [name] is a 58 y.o. 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  Per pt, she has been having a productive cough with clear mucus for the past 3 days. She denies any fevers or chills, chest pain, SOB.   She is followed at [facility] by Dr. Justin [name] who has discussed with her that she will need dialysis at some time soon. She states that she went to a class for dialysis recently and is still thinking about it.   She has completed the course of Prednisone for her pericarditis.   Past Medical History  Diagnosis Date  . Anemia, B12 deficiency   . History of acute pancreatitis   . Right knee pain     No recent imaging on chart  . 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 Dr. Mancel [name], [facility]  . Hypertriglyceridemia   . GERD (gastroesophageal reflux disease)   . Vitamin D deficiency   . Subdural hematoma 02/2008    Likely 2/2 trauma from seizure from EtOH withdrawal, chronic in nature, sees Dr. Jerene [name]. Most recent CT head 10/2009 showing stable but persistent hematoma without mass effect.  . History of seizure disorder     Likely 2/2 alcohol abuse  . Hypocalcemia   . Hypomagnesemia   . Failure to thrive in childhood     Unclear etiology  . HTN (hypertension)   . Thrombocytopenia   . Hepatomegaly     On exam  . Joint pain   . Alcohol abuse   . Vitamin D deficiency   . Pancreatitis   . Insomnia   . Hyperlipidemia   . Pernicious anemia   . Macrocytic anemia   . 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 Depression   . Fx humeral neck 04/17/2011    Transverse  fracture- minimally displaced- managed as outpatient   . ABNORMAL PAP SMEAR, LGSIL 07/23/2008    Annotation: HPV positive CIN I Dr. Mancel [name], [facility] Qualifier: Diagnosis of  By: [name] [name]    . Pneumonia 05/20/2012  . Arthritis     "shoulders" (08/15/2013)  . 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  Marland Kitchen Chronic diastolic CHF (congestive heart failure)   . Hypomagnesemia    Current Outpatient Prescriptions  Medication Sig Dispense Refill  . acetaminophen (TYLENOL) 500 MG tablet Take 1 tablet (500 mg total) by mouth every 4 (four) hours as needed for mild pain, moderate pain or headache. 30 tablet 0  . albuterol (PROAIR HFA) 108 (90 BASE) MCG/ACT inhaler Inhale 1-2 puffs into the lungs every 6 (six) hours as needed for wheezing or shortness of breath. 1 Inhaler 1  . amLODipine (NORVASC) 5 MG tablet Take 1 tablet (5 mg total) by mouth daily. 90 tablet 3  . calcium carbonate (TUMS) 500 MG chewable tablet Chew 6 tablets (1,200 mg of elemental calcium total) by mouth 3 (three) times daily. For bone health 90 tablet 3  . cetirizine (ZYRTEC) 10 MG tablet Take 1  tablet (10 mg total) by mouth daily as needed for allergies. 30 tablet 6  . colchicine 0.6 MG tablet Take 0.5 tablets (0.3 mg total) by mouth daily. 40 tablet 0  . feeding supplement, ENSURE COMPLETE, (ENSURE COMPLETE) LIQD Take 237 mLs by mouth 3 (three) times daily between meals. 90 Bottle 1  . FLUoxetine (PROZAC) 10 MG capsule Take 1 capsule (10 mg total) by mouth daily. For depression 90 capsule 3  . folic acid (FOLVITE) 1 MG tablet Take 1 tablet (1 mg total) by mouth daily. For folic acid replacement 30 tablet 6  . furosemide (LASIX) 40 MG tablet Take 0.5 tablets (20 mg total) by mouth daily. 45 tablet 4  . gabapentin (NEURONTIN) 300 MG capsule Take 2 capsules (600 mg total) by mouth 3 (three) times daily. For anxiety/pain control 540 capsule 1  . lipase/protease/amylase (CREON)  12000 UNITS CPEP capsule Take 1 capsule (12,000 Units total) by mouth 3 (three) times daily before meals. 270 capsule 3  . lisinopril (PRINIVIL,ZESTRIL) 10 MG tablet Take 1 tablet (10 mg total) by mouth daily. 90 tablet 4  . magnesium oxide (MAG-OX) 400 MG tablet Take 400 mg by mouth 4 (four) times daily.    . metoprolol tartrate (LOPRESSOR) 25 MG tablet Take 12.5 mg by mouth 2 (two) times daily.    . Multiple Vitamin (MULTIVITAMIN WITH MINERALS) TABS tablet Take 1 tablet by mouth daily. For vitamin replacement 90 tablet 4  . omeprazole (PRILOSEC) 40 MG capsule Take 1 capsule (40 mg total) by mouth daily. 90 capsule 3  . PREDNISONE PO Take by mouth. As instructed previously    . sodium bicarbonate 650 MG tablet Take 1 tablet (650 mg total) by mouth daily. 30 tablet 3  . thiamine (VITAMIN B-1) 100 MG tablet Take 1 tablet (100 mg total) by mouth daily. For low thiamine 90 tablet 3  . vitamin B-12 (CYANOCOBALAMIN) 250 MCG tablet Take 1 tablet (250 mcg total) by mouth every evening. 90 tablet 3  . Vitamin D, Ergocalciferol, (DRISDOL) 50000 UNITS CAPS capsule Take 1 capsule (50,000 Units total) by mouth every 7 (seven) days. Thursdays: For bone health 12 capsule 3   No current facility-administered medications for this visit.   Family History  Problem Relation Age of Onset  . Cancer Mother     Died from stomach cancer and "flesh eating rash  . Heart failure Father     Died in 80s from an MI  . Alcohol abuse Sister     Twin sister drinks a lot, as did both her parents and brothers  . Stroke Brother     Has 7 brothers, 1 with CVA  . Lupus Mother    History   Social History  . Marital Status: Divorced    Spouse Name: N/A    Number of Children: N/A  . Years of Education: N/A   Social History Main Topics  . Smoking status: Former Smoker -- 0.50 packs/day for 40 years    Types: Cigarettes    Quit date: 09/20/2010  . Smokeless tobacco: Never Used  . Alcohol Use: No  . Drug Use: No      Comment: 08/15/2013 "last drug use was in 2012"  . Sexual Activity: None   Other Topics Concern  . None   Social History Narrative   Lives with her significant other and 2 grandchildren. 1 child   Has 7 brothers and 4 sisters, 1 twin sister.   Unemployed, worked in [employer].  Abuses alcohol-drinks 1 glass of wine daily    No drug use. Former cigarette use quit 1.5 years ago.     11 th grade education            Review of Systems: A 12 point ROS was performed; pertinent positives and negatives were noted in the HPI   Objective:  Physical Exam: Filed Vitals:   05/06/14 0920  BP: 115/72  Pulse: 100  Temp: 98.4 F (36.9 C)  TempSrc: Oral  Height: 5\' 1"  (1.549 m)  Weight: 85 lb 9.6 oz (38.828 kg)  SpO2: 100%   Constitutional: Vital signs reviewed.  Patient is a pl;easant cachetic female in no acute distress and cooperative with exam. Alert and oriented x3.  Head: Normocephalic and atraumatic Eyes: PERRL, EOMI, Mouth: Missing front Cardiovascular: RRR, no MRG, pulses symmetric and intact bilaterally. No peripheral edema. Pulmonary/Chest: Normal respiratory effort, CTAB, no wheezes, rales, or rhonchi Abdominal: Soft. Non-tender, non-distended, bowel sounds are normal, no guarding present.  Musculoskeletal: No joint deformities, erythema, or stiffness Neurological: A&O x3, cranial nerve II-XII are grossly intact, no focal motor deficit  Skin: Warm, dry and intact.  Psychiatric: Normal mood and affect. Speech and behavior is normal.   Assessment & Plan:   Please refer to Problem List based Assessment and Plan

## 2014-05-06 NOTE — Assessment & Plan Note (Signed)
Clear productive cough x3 days. No fevers or chills, SOB, or chest pain. Lungs clear on exam. Suspect this is from allergies vs viral cold.  - Prescribed Robitussin DM q4h PRN - Pt to call if cough does not improve or worsens or if she develops fevers - She is to continue her Zyrtec as well - If the cough does not improve, need to consider ACEi as a cause.

## 2014-05-08 NOTE — Progress Notes (Signed)
INTERNAL MEDICINE TEACHING ATTENDING ADDENDUM - Mikhia Dusek, MD: I reviewed and discussed at the time of visit with the resident Dr. Glenn, the patient's medical history, physical examination, diagnosis and results of pertinent tests and treatment and I agree with the patient's care as documented.  

## 2014-05-18 ENCOUNTER — Other Ambulatory Visit: Payer: Self-pay | Admitting: Internal Medicine

## 2014-05-18 DIAGNOSIS — Z1231 Encounter for screening mammogram for malignant neoplasm of breast: Secondary | ICD-10-CM

## 2014-06-02 ENCOUNTER — Ambulatory Visit (HOSPITAL_COMMUNITY): Payer: Self-pay

## 2014-06-18 ENCOUNTER — Ambulatory Visit (INDEPENDENT_AMBULATORY_CARE_PROVIDER_SITE_OTHER): Payer: Medicare Other | Admitting: Internal Medicine

## 2014-06-18 ENCOUNTER — Other Ambulatory Visit: Payer: Self-pay | Admitting: Internal Medicine

## 2014-06-18 VITALS — BP 128/92 | HR 107 | Temp 98.2°F | Wt 85.4 lb

## 2014-06-18 DIAGNOSIS — D631 Anemia in chronic kidney disease: Secondary | ICD-10-CM

## 2014-06-18 DIAGNOSIS — R5383 Other fatigue: Secondary | ICD-10-CM | POA: Diagnosis not present

## 2014-06-18 DIAGNOSIS — D649 Anemia, unspecified: Secondary | ICD-10-CM | POA: Diagnosis not present

## 2014-06-18 DIAGNOSIS — E43 Unspecified severe protein-calorie malnutrition: Secondary | ICD-10-CM

## 2014-06-18 DIAGNOSIS — D539 Nutritional anemia, unspecified: Secondary | ICD-10-CM | POA: Diagnosis not present

## 2014-06-18 DIAGNOSIS — N189 Chronic kidney disease, unspecified: Principal | ICD-10-CM

## 2014-06-18 DIAGNOSIS — R634 Abnormal weight loss: Secondary | ICD-10-CM

## 2014-06-18 DIAGNOSIS — E2839 Other primary ovarian failure: Secondary | ICD-10-CM

## 2014-06-18 LAB — COMPLETE METABOLIC PANEL WITH GFR
ALT: 32 U/L (ref 0–35)
AST: 195 U/L — AB (ref 0–37)
Albumin: 2.3 g/dL — ABNORMAL LOW (ref 3.5–5.2)
Alkaline Phosphatase: 492 U/L — ABNORMAL HIGH (ref 39–117)
BILIRUBIN TOTAL: 0.3 mg/dL (ref 0.2–1.2)
BUN: 15 mg/dL (ref 6–23)
CO2: 14 meq/L — AB (ref 19–32)
CREATININE: 2.02 mg/dL — AB (ref 0.50–1.10)
Calcium: 6 mg/dL — CL (ref 8.4–10.5)
Chloride: 112 mEq/L (ref 96–112)
GFR, EST AFRICAN AMERICAN: 31 mL/min — AB
GFR, Est Non African American: 27 mL/min — ABNORMAL LOW
GLUCOSE: 116 mg/dL — AB (ref 70–99)
POTASSIUM: 3.7 meq/L (ref 3.5–5.3)
SODIUM: 136 meq/L (ref 135–145)
TOTAL PROTEIN: 7.7 g/dL (ref 6.0–8.3)

## 2014-06-18 LAB — CBC
HCT: 25.5 % — ABNORMAL LOW (ref 36.0–46.0)
HEMOGLOBIN: 8 g/dL — AB (ref 12.0–15.0)
MCH: 30.5 pg (ref 26.0–34.0)
MCHC: 31.4 g/dL (ref 30.0–36.0)
MCV: 97.3 fL (ref 78.0–100.0)
Platelets: 158 10*3/uL (ref 150–400)
RBC: 2.62 MIL/uL — ABNORMAL LOW (ref 3.87–5.11)
RDW: 14.4 % (ref 11.5–15.5)
WBC: 5.9 10*3/uL (ref 4.0–10.5)

## 2014-06-18 LAB — TSH: TSH: 0.769 u[IU]/mL (ref 0.350–4.500)

## 2014-06-18 NOTE — Patient Instructions (Signed)
Continue to take your iron supplement.   Try to keep your feet and hands warm.   Continue to drink the Ensure at least 3 times a day  I am checking additional labs to see if there is something else causing your fatigue.     General Instructions:   Please bring your medicines with you each time you come to clinic.  Medicines may include prescription medications, over-the-counter medications, herbal remedies, eye drops, vitamins, or other pills.   Progress Toward Treatment Goals:  Treatment Goal 05/06/2014  Blood pressure at goal  Prevent falls -    Self Care Goals & Plans:  Self Care Goal 05/06/2014  Manage my medications take my medicines as prescribed; bring my medications to every visit; refill my medications on time; follow the sick day instructions if I am sick  Monitor my health keep track of my weight  Eat healthy foods eat more vegetables; eat fruit for snacks and desserts; eat foods that are low in salt  Be physically active find an activity I enjoy  Meeting treatment goals -    No flowsheet data found.   Care Management & Community Referrals:  Referral 12/25/2013  Referrals made for care management support -  Referrals made to community resources none

## 2014-06-18 NOTE — Assessment & Plan Note (Deleted)
Pt with anemia of chronic disease. Baseline Hgb around 8. She endorses increased fatigue and feeling cold. She endorses compliance with her iron supplements, but I am concerned that her anemia could be worsening. She denies any gross bleeding.  - Checking stat CBC --> Hgb 8.0 - She would likely benefit from IV iron infusion - If her Hgb/Hct are stable, will need to consider other causes for her fatigue and check CMP, Mg, B12, TSH --> checking these labs and repeating anemia panel

## 2014-06-18 NOTE — Assessment & Plan Note (Deleted)
Her weight is stable today from her appt in December. She is using Ensure TID.  - Will continue to monitor closely

## 2014-06-18 NOTE — Assessment & Plan Note (Addendum)
Her weight is stable today from her appt in December. She is using Ensure TID.  - Checking CMP and Mg - Will continue to monitor closely   Chart merge --> previous progress note documentation  05/21/2013 Office Visit Written 05/21/2013 2:16 PM by Otho Bellows, MD   Patient's weight is down 4 pounds today from October. She states that she's been sick recently with a stomach virus which unfortunately affected the whole house. She states that she is drinking her Ensure. I asked if she would like to speak with Debera Lat, our nutritionist for help regarding healthy ways to put on weight. The patient declined at this time. She states that she thinks her weight loss was due to her recent illness, and thinks she will gain the weight back. Will have to keep close followup on Mrs. Foxworthy to make sure that she continues to gain weight instead of losing it.                 06/07/2012 Office Visit Written 06/07/2012 10:36 PM by Ivor Costa, MD   Patient is very cachexia. Her BW is 81.Lb which is stable. It is likely due to poor absorption secondary to her chronic pancreatitis. She is currently taking Ensure and Pangestyme. Patient had extensive work up which did not show any malignance, including mammogram 04/01/12, pap smear 03/28/12, colonoscopy and EGD 07/11/11, CT-abd on 07/13/11. Will continue current regimen and follow up.                       10/26/2011 Office Visit Written 10/26/2011 12:27 PM by Jolene Provost, MD   Wt Readings from Last 3 Encounters:  10/26/11 79 lb 14.4 oz (36.242 kg)  10/11/11 81 lb (36.741 kg)  09/18/11 78 lb 11.2 oz (35.698 kg)   Stable, mild 6 lbs weight loss since January. Good appetite.

## 2014-06-18 NOTE — Progress Notes (Signed)
[name], female   DOB: 1956/05/24, 59 y.o.   MRN: [id number]  Subjective:   [name] female   DOB: June 08, 1955 59 y.o.   MRN: [id number]  HPI: [name] [name] is a 59 y.o. 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   She was last seen in December c/o clear, productive cough x3 days w/o fevers or chills. She was given a prescription for cough syrup and asked to call the clinic if the cough persisted, worsened, or if she developed a fever.  Her cough is better; however today she c/o and fatigue and coldness in her hands and feet.     She has been feeling fatigued and is having cold hands an feet for approximately one month. She is taking iron supplements, but states that Dr. Justin Mend, her Nephrologist is recommending IV iron replacement. She sees him next on Feb 23rd.   Per the patient, she will be starting HD soon. She does not have a fistula and is not sure when that will be placed.   She did not get her mammogram on 06/02/14 as scheduled, b/c her car broke down, and she was not able to make the appointment.     Past Medical History  Diagnosis Date  . Anemia, B12 deficiency   . History of acute pancreatitis   . Right knee pain     No recent imaging on chart  . 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 Dr. Mancel Bale, [facility]  . Hypertriglyceridemia   . GERD (gastroesophageal reflux disease)   . Vitamin D deficiency   . Subdural hematoma 02/2008    Likely 2/2 trauma from seizure from EtOH withdrawal, chronic in nature, sees Dr. Jerene Bears. Most recent CT head 10/2009 showing stable but persistent hematoma without mass effect.  . History of seizure disorder     Likely 2/2 alcohol abuse  .  Hypocalcemia   . Hypomagnesemia   . Failure to thrive in childhood     Unclear etiology  . HTN (hypertension)   . Thrombocytopenia   . Hepatomegaly     On exam  . Joint pain   . Alcohol abuse   . Vitamin D deficiency   . Pancreatitis   . Insomnia   . Hyperlipidemia   . Pernicious anemia   . Macrocytic anemia   . 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 Depression   . Fx humeral neck 04/17/2011    Transverse fracture- minimally displaced- managed as outpatient   . ABNORMAL PAP SMEAR, LGSIL 07/23/2008    Annotation: HPV positive CIN I Dr. Mancel Bale, [facility] Qualifier: Diagnosis of  By: Oretha Ellis    . Pneumonia 05/20/2012  . Arthritis     "shoulders" (08/15/2013)  . 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  Marland Kitchen Chronic diastolic CHF (congestive heart failure)   . Hypomagnesemia    Current Outpatient Prescriptions  Medication Sig Dispense Refill  . amLODipine (NORVASC) 5 MG tablet Take 1 tablet (5 mg total) by mouth daily. 90 tablet 3  . calcitRIOL (ROCALTROL) 0.25 MCG capsule     . calcium carbonate (TUMS) 500 MG  chewable tablet Chew 6 tablets (1,200 mg of elemental calcium total) by mouth 3 (three) times daily. For bone health 90 tablet 3  . cetirizine (ZYRTEC) 10 MG tablet Take 1 tablet (10 mg total) by mouth daily as needed for allergies. 30 tablet 6  . feeding supplement, ENSURE COMPLETE, (ENSURE COMPLETE) LIQD Take 237 mLs by mouth 3 (three) times daily between meals. 90 Bottle 1  . ferrous sulfate 325 (65 FE) MG tablet Take 325 mg by mouth 3 (three) times daily with meals.    Marland Kitchen FLUoxetine (PROZAC) 10 MG capsule Take 1 capsule (10 mg total) by mouth daily. For depression 90 capsule 3  . folic acid (FOLVITE) 1 MG tablet Take 1 tablet (1 mg total) by mouth daily. For folic acid replacement 30 tablet 6  . furosemide (LASIX) 40 MG tablet Take 0.5 tablets (20 mg total) by mouth daily. 45 tablet 4  . gabapentin (NEURONTIN)  300 MG capsule Take 2 capsules (600 mg total) by mouth 3 (three) times daily. For anxiety/pain control 540 capsule 1  . guaiFENesin-dextromethorphan (ROBITUSSIN DM) 100-10 MG/5ML syrup Take 5 mLs by mouth every 4 (four) hours as needed for cough. 118 mL 0  . lipase/protease/amylase (CREON) 12000 UNITS CPEP capsule Take 1 capsule (12,000 Units total) by mouth 3 (three) times daily before meals. 270 capsule 3  . lisinopril (PRINIVIL,ZESTRIL) 10 MG tablet Take 1 tablet (10 mg total) by mouth daily. 90 tablet 4  . magnesium oxide (MAG-OX) 400 MG tablet Take 400 mg by mouth 4 (four) times daily.    . metoprolol tartrate (LOPRESSOR) 25 MG tablet Take 12.5 mg by mouth 2 (two) times daily.    . Multiple Vitamin (MULTIVITAMIN WITH MINERALS) TABS tablet Take 1 tablet by mouth daily. For vitamin replacement 90 tablet 4  . omeprazole (PRILOSEC) 40 MG capsule Take 1 capsule (40 mg total) by mouth daily. 90 capsule 3  . sodium bicarbonate 650 MG tablet Take 1 tablet (650 mg total) by mouth daily. 30 tablet 3  . thiamine (VITAMIN B-1) 100 MG tablet Take 1 tablet (100 mg total) by mouth daily. For low thiamine 90 tablet 3  . vitamin B-12 (CYANOCOBALAMIN) 250 MCG tablet Take 1 tablet (250 mcg total) by mouth every evening. 90 tablet 3  . Vitamin D, Ergocalciferol, (DRISDOL) 50000 UNITS CAPS capsule Take 1 capsule (50,000 Units total) by mouth every 7 (seven) days. Thursdays: For bone health 12 capsule 3  . acetaminophen (TYLENOL) 500 MG tablet Take 1 tablet (500 mg total) by mouth every 4 (four) hours as needed for mild pain, moderate pain or headache. (Patient not taking: Reported on 06/18/2014) 30 tablet 0  . albuterol (PROAIR HFA) 108 (90 BASE) MCG/ACT inhaler Inhale 1-2 puffs into the lungs every 6 (six) hours as needed for wheezing or shortness of breath. (Patient not taking: Reported on 06/18/2014) 1 Inhaler 1  . colchicine 0.6 MG tablet Take 0.5 tablets (0.3 mg total) by mouth daily. 40 tablet 0   No current  facility-administered medications for this visit.   Family History  Problem Relation Age of Onset  . Cancer Mother     Died from stomach cancer and "flesh eating rash  . Heart failure Father     Died in 24s from an MI  . Alcohol abuse Sister     Twin sister drinks a lot, as did both her parents and brothers  . Stroke Brother     Has 7 brothers, 1 with CVA  . Lupus  Mother    History   Social History  . Marital Status: Divorced    Spouse Name: N/A    Number of Children: N/A  . Years of Education: N/A   Social History Main Topics  . Smoking status: Former Smoker -- 0.50 packs/day for 40 years    Types: Cigarettes    Quit date: 09/20/2010  . Smokeless tobacco: Never Used  . Alcohol Use: No  . Drug Use: No     Comment: 08/15/2013 "last drug use was in 2012"  . Sexual Activity: Not on file   Other Topics Concern  . Not on file   Social History Narrative   Lives with her significant other and 2 grandchildren. 1 child   Has 7 brothers and 4 sisters, 1 twin sister.   Unemployed, worked in [employer].    Abuses alcohol-drinks 1 glass of wine daily    No drug use. Former cigarette use quit 1.5 years ago.     11 th grade education            Review of Systems: A 12 point ROS was performed; pertinent positives and negatives were noted in the HPI   Objective:  Physical Exam: Filed Vitals:   06/18/14 1506  BP: 128/92  Pulse: 107  Temp: 98.2 F (36.8 C)  TempSrc: Oral  Weight: 85 lb 6.4 oz (38.737 kg)   Constitutional: Vital signs reviewed.  Patient is a cachectic female in no acute distress, who appears fatigued Head: Normocephalic and atraumatic Eyes: PERRL, EOMI. No scleral icterus.  Cardiovascular: RRR, no MRG, pulses symmetric and intact bilaterally Pulmonary/Chest: Normal respiratory effort, CTAB, no wheezes, rales, or rhonchi Abdominal: Soft. Non-tender, non-distended, bowel sounds are normal Musculoskeletal: No joint deformities, erythema, or  stiffness Neurological: A&O x3,cranial nerve II-XII are grossly intact, no focal motor deficit  Skin: Warm, dry and intact. +clubbing.  Psychiatric: Normal mood and affect. Speech and behavior is normal.   Assessment & Plan:   Please refer to Problem List based Assessment and Plan

## 2014-06-18 NOTE — Assessment & Plan Note (Addendum)
Pt with anemia of chronic disease. Baseline Hgb around 8. She endorses increased fatigue and feeling cold. She endorses compliance with her iron supplements, but I am concerned that her anemia could be worsening. She denies any gross bleeding.  - Checking stat CBC --> Hgb 8.0 - She would likely benefit from IV iron infusion - If her Hgb/Hct are stable, will need to consider other causes for her fatigue and check CMP, Mg, B12, TSH --> checking these labs and repeating anemia panel   Chart merge --> Progress notes  05/06/2014 Office Visit Written 05/06/2014 10:56 AM by Otho Bellows, MD   Pt with anemia of chronic disease. Baseline Hgb ~8. Hgb checked at Nephrology office 11/17 and was 8.8. Will continue to monitor.                 09/18/2013 Office Visit Edited 09/20/2013 3:21 PM by Otho Bellows, MD   Improved Hgb at last clinic visit. Pt currently on iron supplements, will continue. Not rechecking as she just had labs on 4/15 at [facility]. Hgb 11.8 on 08/27/13 from 10.4 on 08/22/13.    Previous Version                        08/27/2013 Office Visit Edited 08/28/2013 9:20 AM by Jeralene Huff   CBC ~ 10 at hospital discharge. -Will recheckHgb level in setting of dark stools although this can be attributed to her iron supplementation.    Previous Version

## 2014-06-18 NOTE — Assessment & Plan Note (Addendum)
Pt with anemia of chronic disease. Baseline Hgb around 8-9. Last check was 8.8 in Nov drawn at Dr. Jason Nest office She endorses increased fatigue and feeling cold. She endorses compliance with her iron supplements. She denies any gross bleeding. CBC w/ Hgb 8.0, slightly down from  - She would likely benefit from IV iron infusion - Need to consider other causes for her fatigue and check CMP, Mg, B12, TSH, and repeating anemia panel  Addendum 06/19/14 TSH normal. Anemia panel with low folate 1.7, ferritin significantly elevated at 2973, UIBC <15, TIBC not calculated. Normal retic count. B-12 normal. Differential includes hemochromatosis, sideroblastic anemia, hemolytic anemia, iatrogenic due to supplementation, inflammatory process: hepatitis, rheumatologic disease (new dx lupus), disease related to alcohol use. Ca 6.0 with albumin of 2.3, corrected Ca 7.4. Bicarb is stable at 14 from Nov '15 but was 36 in July. She is reportedly taking Bicarb supplementation 658m daily. Alk Phos is also elevated at 492, in the absense of abdominal pain, I suspect this is related elevated bone turnover in the setting of her secondary hyperparathyroidism. She is currently on calcitriol and is reportedly taking 12035mcalcium carbonate TID. Serum Phosphate is pending but was 4.0 in Nov. However, her AST is elevated to 195 with ALT 32. [facility] '15, AST 93/ALT 25, compared to 25/15 in July. With her h/o EtOH abuse, I am concerned that she may be drinking again. Serum Magnesium is still pending. Pt discussed with Dr WeJustin Mendher Nephrologist. Pt contacted and endorsed that she was drinking alcohol again, stating "a littile bit" - Strongly encouraged EtOH cessation - Will increase Bicarb to 65061mID from qday - Strongly encouraged Mrs. JacRother take her Calcium Carbonate (TUMS) 1200m49mtablets) TID on an empty stomach - Pt has a f/u appt with Dr. WebbJustin Mend2/23 @11am  - Will have her f/u in  clinic in 1 week.  - Discussing anemia panel findings with Dr. GranBeryle Beams stopping all iron supplementation and will continue to monitor. Pt to return to the clinic next week and will check Hepatits panel at that time. Last check was negative 05/2013.  - F/u appt 1/19@ 8:45am  Addendum: 06/19/14 @ 4:30pm Phos normal at 3.0. Magnesium very low at 0.9. With patient drinking alcohol again, I am not surprised that she is hypomagnesemic, but given that she has been feeling so badly lately, I think that she needs to be admitted for observation on telemetry, electrolyte replacement, and correction of her acidosis. The patient has been contacted and is aware, and the primary admitting team has also been made aware.

## 2014-06-19 ENCOUNTER — Observation Stay (HOSPITAL_COMMUNITY)
Admission: AD | Admit: 2014-06-19 | Discharge: 2014-06-20 | Disposition: A | Discharge status: Home / Self Care | Payer: Medicare Other | Source: Ambulatory Visit | Attending: Internal Medicine | Admitting: Internal Medicine

## 2014-06-19 ENCOUNTER — Other Ambulatory Visit: Payer: Self-pay

## 2014-06-19 ENCOUNTER — Encounter (HOSPITAL_COMMUNITY): Payer: Self-pay | Admitting: General Practice

## 2014-06-19 DIAGNOSIS — E559 Vitamin D deficiency, unspecified: Secondary | ICD-10-CM | POA: Diagnosis present

## 2014-06-19 DIAGNOSIS — I129 Hypertensive chronic kidney disease with stage 1 through stage 4 chronic kidney disease, or unspecified chronic kidney disease: Secondary | ICD-10-CM | POA: Diagnosis not present

## 2014-06-19 DIAGNOSIS — E785 Hyperlipidemia, unspecified: Secondary | ICD-10-CM | POA: Insufficient documentation

## 2014-06-19 DIAGNOSIS — F329 Major depressive disorder, single episode, unspecified: Secondary | ICD-10-CM | POA: Insufficient documentation

## 2014-06-19 DIAGNOSIS — R7989 Other specified abnormal findings of blood chemistry: Secondary | ICD-10-CM | POA: Diagnosis present

## 2014-06-19 DIAGNOSIS — K861 Other chronic pancreatitis: Secondary | ICD-10-CM | POA: Diagnosis not present

## 2014-06-19 DIAGNOSIS — G4700 Insomnia, unspecified: Secondary | ICD-10-CM | POA: Diagnosis not present

## 2014-06-19 DIAGNOSIS — R5383 Other fatigue: Secondary | ICD-10-CM | POA: Diagnosis not present

## 2014-06-19 DIAGNOSIS — E538 Deficiency of other specified B group vitamins: Secondary | ICD-10-CM | POA: Diagnosis present

## 2014-06-19 DIAGNOSIS — N2581 Secondary hyperparathyroidism of renal origin: Secondary | ICD-10-CM | POA: Diagnosis not present

## 2014-06-19 DIAGNOSIS — E872 Acidosis: Secondary | ICD-10-CM | POA: Diagnosis present

## 2014-06-19 DIAGNOSIS — N184 Chronic kidney disease, stage 4 (severe): Secondary | ICD-10-CM | POA: Diagnosis present

## 2014-06-19 DIAGNOSIS — Z87820 Personal history of traumatic brain injury: Secondary | ICD-10-CM | POA: Diagnosis not present

## 2014-06-19 DIAGNOSIS — I5042 Chronic combined systolic (congestive) and diastolic (congestive) heart failure: Secondary | ICD-10-CM | POA: Diagnosis present

## 2014-06-19 DIAGNOSIS — E43 Unspecified severe protein-calorie malnutrition: Secondary | ICD-10-CM | POA: Diagnosis present

## 2014-06-19 DIAGNOSIS — R739 Hyperglycemia, unspecified: Secondary | ICD-10-CM | POA: Diagnosis present

## 2014-06-19 DIAGNOSIS — I5032 Chronic diastolic (congestive) heart failure: Secondary | ICD-10-CM | POA: Insufficient documentation

## 2014-06-19 DIAGNOSIS — R748 Abnormal levels of other serum enzymes: Secondary | ICD-10-CM | POA: Diagnosis not present

## 2014-06-19 DIAGNOSIS — D638 Anemia in other chronic diseases classified elsewhere: Secondary | ICD-10-CM | POA: Diagnosis present

## 2014-06-19 DIAGNOSIS — Z87891 Personal history of nicotine dependence: Secondary | ICD-10-CM | POA: Insufficient documentation

## 2014-06-19 DIAGNOSIS — F1010 Alcohol abuse, uncomplicated: Secondary | ICD-10-CM | POA: Diagnosis not present

## 2014-06-19 DIAGNOSIS — R9431 Abnormal electrocardiogram [ECG] [EKG]: Secondary | ICD-10-CM | POA: Diagnosis present

## 2014-06-19 DIAGNOSIS — Z881 Allergy status to other antibiotic agents status: Secondary | ICD-10-CM | POA: Insufficient documentation

## 2014-06-19 DIAGNOSIS — F32A Depression, unspecified: Secondary | ICD-10-CM | POA: Diagnosis present

## 2014-06-19 DIAGNOSIS — K219 Gastro-esophageal reflux disease without esophagitis: Secondary | ICD-10-CM | POA: Diagnosis not present

## 2014-06-19 DIAGNOSIS — E8729 Other acidosis: Secondary | ICD-10-CM | POA: Diagnosis present

## 2014-06-19 HISTORY — DX: Dependence on supplemental oxygen: Z99.81

## 2014-06-19 LAB — ANEMIA PANEL
ABS Retic: 40.6 10*3/uL (ref 19.0–186.0)
Ferritin: 2973 ng/mL — ABNORMAL HIGH (ref 10–291)
Folate: 1.4 ng/mL — ABNORMAL LOW
IRON: 173 ug/dL — AB (ref 42–145)
RBC.: 2.54 MIL/uL — ABNORMAL LOW (ref 3.87–5.11)
RETIC CT PCT: 1.6 % (ref 0.4–2.3)
UIBC: <15 ug/dL — ABNORMAL LOW (ref 125–400)
Vitamin B-12: 761 pg/mL (ref 211–911)

## 2014-06-19 LAB — PROTIME-INR
INR: 1.28 (ref 0.00–1.49)
PROTHROMBIN TIME: 16.2 s — AB (ref 11.6–15.2)

## 2014-06-19 LAB — MAGNESIUM: Magnesium: 0.9 mg/dL — ABNORMAL LOW (ref 1.5–2.5)

## 2014-06-19 LAB — APTT: APTT: 36 s (ref 24–37)

## 2014-06-19 LAB — PHOSPHORUS: Phosphorus: 3 mg/dL (ref 2.3–4.6)

## 2014-06-19 MED ORDER — GABAPENTIN 300 MG PO CAPS
600.0000 mg | ORAL_CAPSULE | Freq: Three times a day (TID) | ORAL | Status: DC
  Administered 2014-06-19 – 2014-06-20 (×2): 600 mg via ORAL
  Filled 2014-06-19 (×4): qty 2

## 2014-06-19 MED ORDER — ADULT MULTIVITAMIN W/MINERALS CH
1.0000 | ORAL_TABLET | Freq: Every day | ORAL | Status: DC
  Administered 2014-06-19 – 2014-06-20 (×2): 1 via ORAL
  Filled 2014-06-19 (×2): qty 1

## 2014-06-19 MED ORDER — ALBUTEROL SULFATE (2.5 MG/3ML) 0.083% IN NEBU: 2.5000 mg | INHALATION_SOLUTION | Freq: Four times a day (QID) | RESPIRATORY_TRACT | Status: DC | PRN

## 2014-06-19 MED ORDER — LORAZEPAM 1 MG PO TABS: 1.0000 mg | ORAL_TABLET | Freq: Four times a day (QID) | ORAL | Status: DC | PRN

## 2014-06-19 MED ORDER — FOLIC ACID 1 MG PO TABS
1.0000 mg | ORAL_TABLET | Freq: Every day | ORAL | Status: DC
  Administered 2014-06-19 – 2014-06-20 (×2): 1 mg via ORAL
  Filled 2014-06-19 (×2): qty 1

## 2014-06-19 MED ORDER — VITAMIN B-1 100 MG PO TABS
100.0000 mg | ORAL_TABLET | Freq: Every day | ORAL | Status: DC
  Administered 2014-06-19 – 2014-06-20 (×2): 100 mg via ORAL
  Filled 2014-06-19 (×2): qty 1

## 2014-06-19 MED ORDER — ENOXAPARIN SODIUM 30 MG/0.3ML ~~LOC~~ SOLN
30.0000 mg | SUBCUTANEOUS | Status: DC
  Administered 2014-06-19: 30 mg via SUBCUTANEOUS
  Filled 2014-06-19 (×2): qty 0.3

## 2014-06-19 MED ORDER — ENSURE COMPLETE PO LIQD
237.0000 mL | Freq: Three times a day (TID) | ORAL | Status: DC
  Administered 2014-06-20 (×2): 237 mL via ORAL

## 2014-06-19 MED ORDER — SODIUM CHLORIDE 0.9 % IV SOLN: INTRAVENOUS | Status: DC

## 2014-06-19 MED ORDER — MAGNESIUM SULFATE 4 GM/100ML IV SOLN
4.0000 g | Freq: Once | INTRAVENOUS | Status: AC
  Administered 2014-06-19: 4 g via INTRAVENOUS
  Filled 2014-06-19: qty 100

## 2014-06-19 MED ORDER — METOPROLOL TARTRATE 12.5 MG HALF TABLET
12.5000 mg | ORAL_TABLET | Freq: Two times a day (BID) | ORAL | Status: DC
  Administered 2014-06-19 – 2014-06-20 (×2): 12.5 mg via ORAL
  Filled 2014-06-19 (×3): qty 1

## 2014-06-19 MED ORDER — PANCRELIPASE (LIP-PROT-AMYL) 12000-38000 UNITS PO CPEP
12000.0000 [IU] | ORAL_CAPSULE | Freq: Three times a day (TID) | ORAL | Status: DC
  Administered 2014-06-20 (×2): 12000 [IU] via ORAL
  Filled 2014-06-19 (×4): qty 1

## 2014-06-19 MED ORDER — CALCITRIOL 0.25 MCG PO CAPS
0.2500 ug | ORAL_CAPSULE | ORAL | Status: DC
  Administered 2014-06-20: 0.25 ug via ORAL
  Filled 2014-06-19: qty 1

## 2014-06-19 MED ORDER — FLUOXETINE HCL 10 MG PO CAPS
10.0000 mg | ORAL_CAPSULE | Freq: Every day | ORAL | Status: DC
  Administered 2014-06-20: 10 mg via ORAL
  Filled 2014-06-19: qty 1

## 2014-06-19 MED ORDER — CALCIUM CARBONATE ANTACID 500 MG PO CHEW
6.0000 | CHEWABLE_TABLET | Freq: Three times a day (TID) | ORAL | Status: DC
  Administered 2014-06-19 – 2014-06-20 (×2): 1200 mg via ORAL
  Filled 2014-06-19 (×4): qty 6

## 2014-06-19 MED ORDER — THIAMINE HCL 100 MG/ML IJ SOLN
100.0000 mg | Freq: Every day | INTRAMUSCULAR | Status: DC
Start: 2014-06-19 — End: 2014-06-20
  Filled 2014-06-19 (×2): qty 1

## 2014-06-19 MED ORDER — SODIUM BICARBONATE 650 MG PO TABS
650.0000 mg | ORAL_TABLET | Freq: Three times a day (TID) | ORAL | Status: DC
  Administered 2014-06-19 – 2014-06-20 (×2): 650 mg via ORAL
  Filled 2014-06-19 (×4): qty 1

## 2014-06-19 MED ORDER — CYANOCOBALAMIN 250 MCG PO TABS
250.0000 ug | ORAL_TABLET | Freq: Every evening | ORAL | Status: DC
  Filled 2014-06-19: qty 1

## 2014-06-19 MED ORDER — MAGNESIUM OXIDE 400 MG PO TABS: 400.0000 mg | ORAL_TABLET | Freq: Four times a day (QID) | ORAL | Status: DC

## 2014-06-19 MED ORDER — LISINOPRIL 10 MG PO TABS
10.0000 mg | ORAL_TABLET | Freq: Every day | ORAL | Status: DC
  Administered 2014-06-20: 10 mg via ORAL
  Filled 2014-06-19: qty 1

## 2014-06-19 MED ORDER — LORAZEPAM 2 MG/ML IJ SOLN: 1.0000 mg | Freq: Four times a day (QID) | INTRAMUSCULAR | Status: DC | PRN

## 2014-06-19 MED ORDER — MAGNESIUM OXIDE 400 (241.3 MG) MG PO TABS
400.0000 mg | ORAL_TABLET | Freq: Two times a day (BID) | ORAL | Status: DC
  Administered 2014-06-19 – 2014-06-20 (×2): 400 mg via ORAL
  Filled 2014-06-19 (×3): qty 1

## 2014-06-19 MED ORDER — ALBUTEROL SULFATE HFA 108 (90 BASE) MCG/ACT IN AERS: 1.0000 | INHALATION_SPRAY | Freq: Four times a day (QID) | RESPIRATORY_TRACT | Status: DC | PRN

## 2014-06-19 MED ORDER — PANTOPRAZOLE SODIUM 40 MG PO TBEC
40.0000 mg | DELAYED_RELEASE_TABLET | Freq: Every day | ORAL | Status: DC
  Administered 2014-06-20: 40 mg via ORAL

## 2014-06-19 MED ORDER — SODIUM CHLORIDE 0.9 % IV SOLN
INTRAVENOUS | Status: AC
  Administered 2014-06-19: 23:00:00 via INTRAVENOUS

## 2014-06-19 MED ORDER — AMLODIPINE BESYLATE 5 MG PO TABS
5.0000 mg | ORAL_TABLET | Freq: Every day | ORAL | Status: DC
  Administered 2014-06-20: 5 mg via ORAL
  Filled 2014-06-19: qty 1

## 2014-06-19 MED ORDER — FUROSEMIDE 20 MG PO TABS
20.0000 mg | ORAL_TABLET | Freq: Every day | ORAL | Status: DC
  Administered 2014-06-20: 20 mg via ORAL
  Filled 2014-06-19: qty 1

## 2014-06-19 NOTE — Progress Notes (Signed)
INTERNAL MEDICINE TEACHING ATTENDING ADDENDUM - Olamide Lahaie, MD: I reviewed and discussed at the time of visit with the resident Dr. Glenn, the patient's medical history, physical examination, diagnosis and results of pertinent tests and treatment and I agree with the patient's care as documented.  

## 2014-06-19 NOTE — H&P (Signed)
Date: 06/19/2014               Patient Name:  [name] [name] MRN: [id number]  DOB: 03-06-1956 Age / Sex: 59 y.o., female   PCP: Otho Bellows, MD         Medical Service: Internal Medicine Teaching Service         Attending Physician: Dr. Sid Falcon, MD    First Contact: Dr. Charlott Rakes Pager: [phone]  Second Contact: Dr. Bing Neighbors Pager: [phone]       After Hours (After 5p/  First Contact Pager: [phone]  weekends / holidays): Second Contact Pager: [phone]   Chief Complaint: fatigue  History of Present Illness: [name] [name] is a 59 year old woman with alcohol dependence, CKD4 w/w FSGS (baseline cr 2-2.5), anemia of chronic disease (baseline Hgb 8), chronic pancreatitis, sCHF, HTN, recent pericarditis w moderate tamponade s/p pericardia window 12/04/13 who presented to [facility] yesterday for back pain and fatigue. She told Dr Clayburn Pert that she has been feeling fatigued with cold hands and feet for about a month. She says the tips of her fingers and teos were turning white (she is African-American) when she goes out in the cold. She has a dry cough in the morning with rare post-tussive emesis. She says she is eating less since she started drinking alcohol frequently around the holidays. She says she is taking iron supplements but thinks Dr Justin Mend (renal) wants IV iron replacement. She says she will be starting HD soon but does not have a fistula. She began drinking alcohol again around the holidays. She drinks 2-3 glasses of wine a day. Last drink 1/14/ after clinic. She denies any fevers, chills, night sweats, chest pain, SOB, palpitations, abdominal pain, diarrhea, constipation, headache, dizziness, lightheadedness. She notes she takes "26 pills a day" but does not think she has problems keeping track of them.  Initial work-up by Dr Eulas Post for fatigue noted her hemoglobin was 8 (at baseline), TSH normal, anemia panel with low folate, ferritin 2973, UIBC < 15, and TIBC not calculated,  B12 normal. Corrected calcium of 7.4. She had a bicarbonate of 14 which is stable from 04/2014 but she is reportedly taking sodium bicarbonate supplementation. Her magnesium was very low at 0.9. The decision was made that the patient should be admitted for observation on telemetry, electrolyte replacement, and acidosis correction.   Meds: Current Facility-Administered Medications  Medication Dose Route Frequency Provider Last Rate Last Dose  . 0.9 %  sodium chloride infusion   Intravenous Continuous Marjan Rabbani, MD      . albuterol (PROVENTIL) (2.5 MG/3ML) 0.083% nebulizer solution 2.5 mg  2.5 mg Nebulization Q6H PRN Sid Falcon, MD      . Derrill Memo ON 06/20/2014] amLODipine (NORVASC) tablet 5 mg  5 mg Oral Daily Juluis Mire, MD      . Derrill Memo ON 06/20/2014] calcitRIOL (ROCALTROL) capsule 0.25 mcg  0.25 mcg Oral QODAY Marjan Rabbani, MD      . calcium carbonate (TUMS - dosed in mg elemental calcium) chewable tablet 1,200 mg of elemental calcium  6 tablet Oral TID Marjan Rabbani, MD      . enoxaparin (LOVENOX) injection 30 mg  30 mg Subcutaneous Q24H Marjan Rabbani, MD      . feeding supplement (ENSURE COMPLETE) (ENSURE COMPLETE) liquid 237 mL  237 mL Oral TID BM Marjan Rabbani, MD      . Derrill Memo ON 06/20/2014] FLUoxetine (PROZAC) capsule 10 mg  10 mg Oral Daily  Juluis Mire, MD      . folic acid (FOLVITE) tablet 1 mg  1 mg Oral Daily Marjan Rabbani, MD   1 mg at 06/19/14 2108  . [START ON 06/20/2014] furosemide (LASIX) tablet 20 mg  20 mg Oral Daily Marjan Rabbani, MD      . gabapentin (NEURONTIN) capsule 600 mg  600 mg Oral TID Juluis Mire, MD      . Derrill Memo ON 06/20/2014] lipase/protease/amylase (CREON) capsule 12,000 Units  12,000 Units Oral TID AC Marjan Rabbani, MD      . Derrill Memo ON 06/20/2014] lisinopril (PRINIVIL,ZESTRIL) tablet 10 mg  10 mg Oral Daily Marjan Rabbani, MD      . LORazepam (ATIVAN) tablet 1 mg  1 mg Oral Q6H PRN Juluis Mire, MD       Or  . LORazepam (ATIVAN) injection 1  mg  1 mg Intravenous Q6H PRN Marjan Rabbani, MD      . magnesium oxide (MAG-OX) tablet 400 mg  400 mg Oral BID Sid Falcon, MD      . magnesium sulfate IVPB 4 g 100 mL  4 g Intravenous Once Juluis Mire, MD   4 g at 06/19/14 2108  . metoprolol tartrate (LOPRESSOR) tablet 12.5 mg  12.5 mg Oral BID Juluis Mire, MD      . multivitamin with minerals tablet 1 tablet  1 tablet Oral Daily Juluis Mire, MD   1 tablet at 06/19/14 2108  . [START ON 06/20/2014] pantoprazole (PROTONIX) EC tablet 40 mg  40 mg Oral Daily Marjan Rabbani, MD      . sodium bicarbonate tablet 650 mg  650 mg Oral TID Juluis Mire, MD      . thiamine (VITAMIN B-1) tablet 100 mg  100 mg Oral Daily Marjan Rabbani, MD   100 mg at 06/19/14 2109   Or  . thiamine (B-1) injection 100 mg  100 mg Intravenous Daily Marjan Rabbani, MD      . Derrill Memo ON 06/20/2014] vitamin B-12 (CYANOCOBALAMIN) tablet 250 mcg  250 mcg Oral QPM Juluis Mire, MD        Allergies: Allergies as of 06/19/2014 - Review Complete 06/19/2014  Allergen Reaction Noted  . Amitriptyline hcl Swelling   . Doxycycline hyclate Itching 11/10/2010   Past Medical History  Diagnosis Date  . Anemia, B12 deficiency   . History of acute pancreatitis   . Right knee pain     No recent imaging on chart  . 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 Dr. Mancel Bale, [name]  . Hypertriglyceridemia   . GERD (gastroesophageal reflux disease)   . Vitamin D deficiency   . Subdural hematoma 02/2008    Likely 2/2 trauma from seizure from EtOH withdrawal, chronic in nature, sees Dr. Jerene Bears. Most recent CT head 10/2009 showing stable but persistent hematoma without mass effect.  . History of seizure disorder     Likely 2/2 alcohol abuse  . Hypocalcemia   . Hypomagnesemia   . Failure to thrive in childhood     Unclear etiology  . HTN (hypertension)   . Thrombocytopenia   . Hepatomegaly     On exam  .  Joint pain   . Alcohol abuse   . Vitamin D deficiency   . Pancreatitis   . Insomnia   . Hyperlipidemia   . Pernicious anemia   . Macrocytic anemia   . 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  Depression   . Fx humeral neck 04/17/2011    Transverse fracture- minimally displaced- managed as outpatient   . ABNORMAL PAP SMEAR, LGSIL 07/23/2008    Annotation: HPV positive CIN I Dr. Mancel Bale, [facility] Qualifier: Diagnosis of  By: Oretha Ellis    . Pneumonia 05/20/2012  . Arthritis     "shoulders" (08/15/2013)  . 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  Marland Kitchen Chronic diastolic CHF (congestive heart failure)   . Hypomagnesemia    Past Surgical History  Procedure Laterality Date  . Cesarean section  1983  . Esophagogastroduodenoscopy  07/11/2011    Procedure: ESOPHAGOGASTRODUODENOSCOPY (EGD);  Surgeon: Beryle Beams, MD;  Location: [facility];  Service: Endoscopy;  Laterality: N/A;  . Colonoscopy  07/11/2011    Procedure: COLONOSCOPY;  Surgeon: Beryle Beams, MD;  Location: [facility];  Service: Endoscopy;  Laterality: N/A;  . Eye surgery Left     "trauma"  . Right colectomy  08/28/2011  . Esophagogastroduodenoscopy N/A 12/01/2012    Procedure: ESOPHAGOGASTRODUODENOSCOPY (EGD);  Surgeon: Irene Shipper, MD;  Location: [facility];  Service: Endoscopy;  Laterality: N/A;  . Colonoscopy with esophagogastroduodenoscopy (egd) Left 08/21/2013    Procedure: COLONOSCOPY WITH ESOPHAGOGASTRODUODENOSCOPY (EGD);  Surgeon: Beryle Beams, MD;  Location: [facility];  Service: Endoscopy;  Laterality: Left;  . Subxyphoid pericardial window N/A 12/04/2013    Procedure: SUBXYPHOID PERICARDIAL WINDOW WITH TEE;  Surgeon: Ivin Poot, MD;  Location: [place];  Service: Thoracic;  Laterality: N/A;  . Intraoperative transesophageal echocardiogram N/A 12/04/2013    Procedure: INTRAOPERATIVE TRANSESOPHAGEAL ECHOCARDIOGRAM;  Surgeon: Ivin Poot, MD;  Location:  [place];  Service: Open Heart Surgery;  Laterality: N/A;   Family History  Problem Relation Age of Onset  . Cancer Mother     Died from stomach cancer and "flesh eating rash  . Heart failure Father     Died in 31s from an MI  . Alcohol abuse Sister     Twin sister drinks a lot, as did both her parents and brothers  . Stroke Brother     Has 7 brothers, 1 with CVA  . Lupus Mother    History   Social History  . Marital Status: Divorced    Spouse Name: N/A    Number of Children: N/A  . Years of Education: N/A   Occupational History  . Not on file.   Social History Main Topics  . Smoking status: Former Smoker -- 0.50 packs/day for 40 years    Types: Cigarettes    Quit date: 09/20/2010  . Smokeless tobacco: Never Used  . Alcohol Use: No  . Drug Use: No     Comment: 08/15/2013 "last drug use was in 2012"  . Sexual Activity: Not on file   Other Topics Concern  . Not on file   Social History Narrative   Lives with her significant other and 2 grandchildren. 1 child   Has 7 brothers and 4 sisters, 1 twin sister.   Unemployed, worked in [employer].    Abuses alcohol-drinks 1 glass of wine daily    No drug use. Former cigarette use quit 1.5 years ago.     11 th grade education             Review of Systems: A comprehensive review of systems was negative except for: as noted above per HPI  Physical Exam: There were no vitals taken  for this visit.  Gen: A&O x 4, no acute distress, thin HEENT: Atraumatic, PERRL, EOMI, sclerae anicteric, moist mucous membranes, poor dentition Heart: Tachycardic but regular rhythm, normal S1 S2, no murmurs, rubs, or gallops Lungs: Clear to auscultation bilaterally, respirations unlabored Abd: Soft, non-tender, non-distended, + bowel sounds, liver edge palpable ~ 3 cm below R rib margin Ext: No edema or cyanosis  Lab results: Basic Metabolic Panel:  Recent Labs  06/18/14 06/18/14 1620 06/19/14 0426  NA  --  136  --   K  --  3.7  --     CL  --  112  --   CO2  --  14*  --   GLUCOSE  --  116*  --   BUN  --  15  --   CREATININE  --  2.02*  --   CALCIUM  --  6.0*  --   MG  --   --  0.9*  PHOS 3.0  --   --    Liver Function Tests:  Recent Labs  06/18/14 1620  AST 195*  ALT 32  ALKPHOS 492*  BILITOT 0.3  PROT 7.7  ALBUMIN 2.3*   CBC:  Recent Labs  06/18/14 1516  WBC 5.9  HGB 8.0*  HCT 25.5*  MCV 97.3  PLT 158   Thyroid Function Tests:  Recent Labs  06/18/14 1620  TSH 0.769   Anemia Panel:  Recent Labs  06/18/14 1620  VITAMINB12 761  FOLATE 1.4*  FERRITIN 2973*  TIBC NOT CALC  IRON 173*  RETICCTPCT 1.6   Imaging results:  No results found.  Other results: EKG: none available yet (ordered)  Assessment & Plan by Problem: Active Problems:   Hypomagnesemia   #Fatigue: The differential of [name]. Apple fatigue includes electrolyte abnormality, hemochromatosis, iatrogenic from supplementation, inflammatory such as hepatitis, rheumatologic, alcohol related. This may be due to her hypomagnesemia in the setting of alcohol use. She is also acidotic with bicarb of 14. She has an elevated ferritin which can be seen in hemochromatosis. She has an elevated ANA in the past with titer1:320 11/2013. She has never been told she has lupus but some physicians have mentioned lupus in the past and she says her mother had lupus. Denies ulcers or rashes. She has hand and knee aching and describes Raynauds phenomenon and had pericarditis. She does not know of any other autoimmune disease in her family. Denies family history of hemochromatosis or "high iron." TSH normal. Anemia is stable (see below) but ferritin is elevated with titer 1:320 11/2013. At home she takes sodium bicarbonate 650 mg po daily, calcium carbonate 1200 mg tid, caitriol 0.25 mcg qod. Dr Beryle Beams recommended stopping the iron and testing for hemochromatosis. -abd u/s ruq -complement total -anti double stranded DNA -complement C3, C4 -anti  phospholipid antibody -consider C282Y and H63D gene testing for hemochromatosis if transferrin >45 -vitamin d 25 hydroxy -magnesium sulfate 4 g iv -magnesium oxide 400 mg qid starting tomorrow -sodium bicarbonate 650 mg po tid -calcium carbonate 1200 mg po tid -calcitrol 0.25 mcg po qod -NS @ 50 cc/hr -EKG -cardiac monitoring  #Elevated Liver Enzymes: Unclear cause as she has no abdominal pain but some hepatomegaly on exam. Last LFT check 04/2014 alk phos 341 w last imaging u/s 06/2013 notable for gallbladder sludge, no cholelithiasis or choledocholithiasis. Also notes sequelae of chronic pancreatitis and echogenic renal parenchyma suggesting medical renal disease. AST/ALT 93/25 respectively. AST/ALT level is 6:1 ratio which is suggestive of alcohol abuse. 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 Elevated alkaline phosphatase is may be 2/2 secondary hyperparathyroidism from CKD. Last hepatitis panel was hepatitis C negative, B surface negative on 09/17/13 at [facility]. -abd u/s ruq -hepatitis panel -anti smooth muscle antibody -GGT -EtOH level -PT/INR -PTT  #Anemia of Chronic Disease: [name] Callas's hemoglobin of 8 appears to be around her baseline. Anemia panel obtained in clinic notable for unmeasurable TIBC and extremely elevated ferritin. Previous anemia panel 11/2013 w ferritin 1035, iron 102, TID 138 and ferritin previously 1474 in 03/2012. She was taking ferrous sulfate 325 mg po tidwc for about four years. It appears Dr Justin Mend wanted to begin iv iron supplementation. Dr Beryle Beams recommended stopping the iron and testing for hemochromatosis. -hold iron supplementation -check transferrin -consider C282Y and H63D gene testing for hemochromatosis if transferrin >45 -cont to monitor  #CKD4:  Presenting creatinine of 2.02 GFR 31. Her baseline appears to be creatinine 2-2.5. Renal biopsy 10/29/13  noted FSGS. She  is followed by Dr Justin Mend with appointment 07/28/14. She says she is supposed to get a fistula placed and then put on HD. At home she takes sodium bicarbonate 650 mg po daily.  -sodium bicarbonate 650 mg po tid -calcium carbonate 1200 mg po tid -calcitrol 0.25 mcg po qod -NS @ 50 cc/hr  #Alcohol Abuse: She drinks 2-3 glasses of wine a night. Most recent drink last night. This may have caused her hypomagnesemia -CIWA protocol -MVI -folic acid 1 mg po daily -thiamine 100 mg po or iv daily -EtOH level -social work consult  #sCHF: Last echo 01/01/14 notable for EF 40-45% with LVH. At home she takes lasix 20 mg daily, metoprolol 12.5 mg bid lisinopril 10 mg daily. Consider HF with fluid hydration. -hold lasix while hydrating -restart lisinopril tomorrow -daily weight, I&O  #Secondary Hyperparathyroidism: PTH 695 on 01/20/14. At home she takes calcium carbonate 1200 mg tid, caitriol 0.25 mcg qod. -calcium carbonate 1200 mg po tid -calcitrol 0.25 mcg po qod -GGT  #HTN: BP 128/92. At home she takes amlodipine 5 mg po daily, lisinopril 10 mg daily, metoprolol 12.5 mg bid, and lasix 20 mg daily. -cont metoprolol 12.5 mg bid -restart lisinopril 10 mg daily, amlodipine 5 mg daily tomorrow -hold lasix  #Chronic Pancreatitis: At home she takes creon 1 tab tidac. -cont creon 1 tab tidac.  #Diet: renal  #DVT PPx: lovenox 30 mg Wright daily  #Code: full   Dispo: Disposition is deferred at this time, awaiting improvement of current medical problems. Anticipated discharge in approximately 1-2 day(s).   The patient does have a current PCP Otho Bellows, MD) and does need an [facility] hospital follow-up appointment after discharge.  The patient does not know have transportation limitations that hinder transportation to clinic appointments.  Signed: Kelby Aline, MD 06/19/2014, 9:52 PM

## 2014-06-19 NOTE — Addendum Note (Signed)
Addended by: Clayburn Pert F on: 06/19/2014 11:33 AM   Modules accepted: Orders

## 2014-06-20 ENCOUNTER — Observation Stay (HOSPITAL_COMMUNITY): Payer: Medicare Other

## 2014-06-20 DIAGNOSIS — E538 Deficiency of other specified B group vitamins: Secondary | ICD-10-CM | POA: Diagnosis not present

## 2014-06-20 DIAGNOSIS — R740 Nonspecific elevation of levels of transaminase and lactic acid dehydrogenase [LDH]: Secondary | ICD-10-CM

## 2014-06-20 DIAGNOSIS — K859 Acute pancreatitis, unspecified: Secondary | ICD-10-CM | POA: Diagnosis not present

## 2014-06-20 DIAGNOSIS — I129 Hypertensive chronic kidney disease with stage 1 through stage 4 chronic kidney disease, or unspecified chronic kidney disease: Secondary | ICD-10-CM | POA: Diagnosis not present

## 2014-06-20 DIAGNOSIS — I5032 Chronic diastolic (congestive) heart failure: Secondary | ICD-10-CM | POA: Diagnosis not present

## 2014-06-20 DIAGNOSIS — D649 Anemia, unspecified: Secondary | ICD-10-CM | POA: Diagnosis not present

## 2014-06-20 DIAGNOSIS — Z87891 Personal history of nicotine dependence: Secondary | ICD-10-CM | POA: Diagnosis not present

## 2014-06-20 DIAGNOSIS — N184 Chronic kidney disease, stage 4 (severe): Secondary | ICD-10-CM

## 2014-06-20 DIAGNOSIS — I10 Essential (primary) hypertension: Secondary | ICD-10-CM

## 2014-06-20 DIAGNOSIS — K861 Other chronic pancreatitis: Secondary | ICD-10-CM

## 2014-06-20 DIAGNOSIS — K219 Gastro-esophageal reflux disease without esophagitis: Secondary | ICD-10-CM | POA: Diagnosis not present

## 2014-06-20 DIAGNOSIS — Z881 Allergy status to other antibiotic agents status: Secondary | ICD-10-CM | POA: Diagnosis not present

## 2014-06-20 DIAGNOSIS — F329 Major depressive disorder, single episode, unspecified: Secondary | ICD-10-CM | POA: Diagnosis not present

## 2014-06-20 DIAGNOSIS — R5383 Other fatigue: Secondary | ICD-10-CM | POA: Diagnosis not present

## 2014-06-20 DIAGNOSIS — I82409 Acute embolism and thrombosis of unspecified deep veins of unspecified lower extremity: Secondary | ICD-10-CM

## 2014-06-20 DIAGNOSIS — G4700 Insomnia, unspecified: Secondary | ICD-10-CM | POA: Diagnosis not present

## 2014-06-20 DIAGNOSIS — K828 Other specified diseases of gallbladder: Secondary | ICD-10-CM | POA: Diagnosis not present

## 2014-06-20 DIAGNOSIS — F1010 Alcohol abuse, uncomplicated: Secondary | ICD-10-CM

## 2014-06-20 DIAGNOSIS — E213 Hyperparathyroidism, unspecified: Secondary | ICD-10-CM

## 2014-06-20 DIAGNOSIS — D638 Anemia in other chronic diseases classified elsewhere: Secondary | ICD-10-CM | POA: Diagnosis present

## 2014-06-20 DIAGNOSIS — R7989 Other specified abnormal findings of blood chemistry: Secondary | ICD-10-CM | POA: Diagnosis present

## 2014-06-20 DIAGNOSIS — R748 Abnormal levels of other serum enzymes: Secondary | ICD-10-CM | POA: Diagnosis not present

## 2014-06-20 DIAGNOSIS — R9431 Abnormal electrocardiogram [ECG] [EKG]: Secondary | ICD-10-CM | POA: Diagnosis present

## 2014-06-20 DIAGNOSIS — E785 Hyperlipidemia, unspecified: Secondary | ICD-10-CM | POA: Diagnosis not present

## 2014-06-20 DIAGNOSIS — R945 Abnormal results of liver function studies: Secondary | ICD-10-CM | POA: Diagnosis not present

## 2014-06-20 DIAGNOSIS — N2581 Secondary hyperparathyroidism of renal origin: Secondary | ICD-10-CM | POA: Diagnosis not present

## 2014-06-20 DIAGNOSIS — Z87820 Personal history of traumatic brain injury: Secondary | ICD-10-CM | POA: Diagnosis not present

## 2014-06-20 LAB — TECHNOLOGIST SMEAR REVIEW

## 2014-06-20 LAB — GAMMA GT: GGT: 616 U/L — AB (ref 7–51)

## 2014-06-20 LAB — HEPATITIS PANEL, ACUTE
HCV Ab: NEGATIVE
HEP B S AG: NEGATIVE
Hep A IgM: NONREACTIVE
Hep B C IgM: NONREACTIVE

## 2014-06-20 LAB — LACTATE DEHYDROGENASE: LDH: 387 U/L — ABNORMAL HIGH (ref 94–250)

## 2014-06-20 LAB — ETHANOL: Alcohol, Ethyl (B): 120 mg/dL — ABNORMAL HIGH (ref 0–9)

## 2014-06-20 LAB — SEDIMENTATION RATE: SED RATE: 138 mm/h — AB (ref 0–22)

## 2014-06-20 LAB — MAGNESIUM
MAGNESIUM: 2.9 mg/dL — AB (ref 1.5–2.5)
Magnesium: 3.6 mg/dL — ABNORMAL HIGH (ref 1.5–2.5)

## 2014-06-20 LAB — C-REACTIVE PROTEIN

## 2014-06-20 LAB — RHEUMATOID FACTOR: Rhuematoid fact SerPl-aCnc: 10 IU/mL (ref <=14)

## 2014-06-20 LAB — HEMOGLOBIN A1C
Hgb A1c MFr Bld: 5.1 % (ref <5.7)
MEAN PLASMA GLUCOSE: 100 mg/dL (ref <117)

## 2014-06-20 MED ORDER — HEPARIN SODIUM (PORCINE) 5000 UNIT/ML IJ SOLN
5000.0000 [IU] | Freq: Three times a day (TID) | INTRAMUSCULAR | Status: DC
  Administered 2014-06-20 (×2): 5000 [IU] via SUBCUTANEOUS

## 2014-06-20 MED ORDER — VITAMIN D3 25 MCG (1000 UNIT) PO TABS
1000.0000 [IU] | ORAL_TABLET | Freq: Every day | ORAL | Status: DC
Start: 2014-06-20 — End: 2014-06-20
  Administered 2014-06-20: 1000 [IU] via ORAL
  Filled 2014-06-20: qty 1

## 2014-06-20 NOTE — Progress Notes (Addendum)
Subjective: This AM, she reports feeling about the same as yesterday. She reports that her drinking is a problem and that it's been very difficult for her to quit though she has recently completed a program with classes 4 days/week. Otherwise, she denies any recent illness, sick contact, nausea, vomiting, diarrhea, new rashes or lesions. She does report that her appetite fluctuates and is somewhat associated with her drinking. In the past, she reports smoking crack, cigarettes, and using cocaine though as quit for about 10 years.  Objective: Vital signs in last 24 hours: Filed Vitals:   06/19/14 2237 06/20/14 0630 06/20/14 0637 06/20/14 1035  BP: 131/89 123/84  164/89  Pulse: 94 91    Temp: 98.6 F (37 C) 99 F (37.2 C)    TempSrc: Oral Oral    Resp: 12 15    Weight:   85 lb 1.6 oz (38.6 kg)   SpO2: 100% 100%     Weight change:   Intake/Output Summary (Last 24 hours) at 06/20/14 1208 Last data filed at 06/20/14 1025  Gross per 24 hour  Intake 1941.17 ml  Output    500 ml  Net 1441.17 ml   General: resting in bed, NAD HEENT: PERRL, EOMI, no scleral icterus Cardiac: RRR, no rubs, murmurs or gallops Pulm: clear to auscultation bilaterally, no wheezes, rales, or rhonchi Abd: soft, nontender, nondistended, BS present Ext: warm and well perfused, no pedal edema Neuro: responds to questions appropriately; moving all extremities freely   Lab Results: Basic Metabolic Panel:  Recent Labs Lab 06/18/14 06/18/14 1620  06/19/14 2330 06/20/14 0540  NA  --  136  --   --   --   K  --  3.7  --   --   --   CL  --  112  --   --   --   CO2  --  14*  --   --   --   GLUCOSE  --  116*  --   --   --   BUN  --  15  --   --   --   CREATININE  --  2.02*  --   --   --   CALCIUM  --  6.0*  --   --   --   MG  --   --   < > 3.6* 2.9*  PHOS 3.0  --   --   --   --   < > = values in this interval not displayed. Liver Function Tests:  Recent Labs Lab 06/18/14 1620  AST 195*  ALT 32    ALKPHOS 492*  BILITOT 0.3  PROT 7.7  ALBUMIN 2.3*   CBC:  Recent Labs Lab 06/18/14 1516  WBC 5.9  HGB 8.0*  HCT 25.5*  MCV 97.3  PLT 158   Thyroid Function Tests:  Recent Labs Lab 06/18/14 1620  TSH 0.769   Coagulation:  Recent Labs Lab 06/19/14 2330  LABPROT 16.2*  INR 1.28   Anemia Panel:  Recent Labs Lab 06/18/14 1620  VITAMINB12 761  FOLATE 1.4*  FERRITIN 2973*  TIBC NOT CALC  IRON 173*  RETICCTPCT 1.6   Urine Drug Screen: Drugs of Abuse     Component Value Date/Time   LABOPIA POSITIVE* 11/22/2013 1406   LABOPIA NEG 09/18/2011 0936   COCAINSCRNUR NONE DETECTED 11/22/2013 1406   COCAINSCRNUR NEG 09/18/2011 0936   LABBENZ NONE DETECTED 11/22/2013 1406   LABBENZ NEG 09/18/2011 0936   LABBENZ NEG 04/10/2011  [address] 11/22/2013 1406   AMPHETMU NEG 09/18/2011 0936   AMPHETMU NEG 04/10/2011 1130   THCU NONE DETECTED 11/22/2013 1406   THCU NEG 09/18/2011 0936   LABBARB NONE DETECTED 11/22/2013 1406   LABBARB NEG 09/18/2011 0936    Alcohol Level:  Recent Labs Lab 06/19/14 2330  ETH 120*   Studies/Results: US Abdomen Limited Ruq  06/20/2014   CLINICAL DATA:  Elevated liver function tests.  Pancreatitis.  EXAM: US ABDOMEN LIMITED - RIGHT UPPER QUADRANT  COMPARISON:  06/30/2013  FINDINGS: Gallbladder:  A small amount of echogenic debris is seen within the gallbladder, however no gallstones are identified. No evidence of gallbladder wall thickening. No sonographic Murphy sign noted by sonographer.  Common bile duct:  Diameter: 2 mm  Liver:  No focal lesion identified. Within normal limits in parenchymal echogenicity.  IMPRESSION: Small amount of gallbladder sludge noted, without definite gallstones. No other hepatobiliary abnormality visualized.   Electronically Signed   By: Earle Gell M.D.   On: 06/20/2014 09:46   Medications: I have reviewed the patient's current medications. Scheduled Meds: . amLODipine  5 mg Oral Daily  .  calcitRIOL  0.25 mcg Oral QODAY  . calcium carbonate  6 tablet Oral TID  . cholecalciferol  1,000 Units Oral Daily  . feeding supplement (ENSURE COMPLETE)  237 mL Oral TID BM  . FLUoxetine  10 mg Oral Daily  . folic acid  1 mg Oral Daily  . furosemide  20 mg Oral Daily  . gabapentin  600 mg Oral TID  . heparin subcutaneous  5,000 Units Subcutaneous 3 times per day  . lipase/protease/amylase  12,000 Units Oral TID AC  . lisinopril  10 mg Oral Daily  . magnesium oxide  400 mg Oral BID  . metoprolol tartrate  12.5 mg Oral BID  . multivitamin with minerals  1 tablet Oral Daily  . pantoprazole  40 mg Oral Daily  . sodium bicarbonate  650 mg Oral TID  . thiamine  100 mg Oral Daily   Or  . thiamine  100 mg Intravenous Daily  . vitamin B-12  250 mcg Oral QPM   Continuous Infusions:  PRN Meds:.albuterol, LORazepam **OR** LORazepam Assessment/Plan: Fatigue: Suspect some of this is chronic given her EtOH abuse complicated by electrolyte abnormalities, namely Mg. Admitting providers reported that she did appear intoxicated at the time of interview and was consistent with elevated EtOH in blood and may have thus confounded her initial report of symptoms. Mg is now 2.9 after having received Mg 4g IV. Though she does have family history for lupus, autoimmune workup can be followed up as outpatient. -Suggested social work consult though she was not agreeable to it but reports that she will continue trying -Encouraged her willingness to work on her alcohol issues -Assess her ability to ambulate and discharge if stable  Transaminitis: AST/ALT ratio is 6:1 and thus greater than the 2:1 associated with chronic EtOH abuse. Abdominal US was remarkable only for some biliary sludge this AM but no acute process - consistent with physical exam documented above. PT/INR 16.2 & 1.3 this AM. -Follow-up autoimmune & hepatitis workup as outpatient  Anemia of chronic disease: Hb 8 on admission which appears to be her  baseline. She reports not taking Fe for two months though was found to have supranormal ferritin 2973. -Plan to discontinue Fe therapy for now  -Consider C282Y & H63D gene testing as outpatient  CKD Stage 4: Crt 2.02 on admission,  baseline 2-2.5. FSGS diagnosed by renal biopsy on  -Follow-up with Dr. Justin Mend on 07/28/14  Alcohol abuse: CIWA 0 overnight.  -As noted above.   CHF: EF 40-45% with LVH on echo 01/01/14.  -Resume home Lasix at discharge  Secondary hyperparathyroidism: Resume home CaCO3 & calcitriol on discharge.  HTN: BP trending mostly normotensive though most recently 164/89 on home metoprolol, Lasix, lisinopril, amlodipine. Recheck BP 134/91. -Resume home meds on discharge  Chronic pancreatitis: Continue home Creon.  #FEN:  -Diet: Renal  #DVT prophylaxis: Lovenox  #CODE STATUS: FULL CODE  Dispo: Stable for discharge today pending ability to ambulate  The patient does have a current PCP Otho Bellows, MD) and does not need an [facility] hospital follow-up appointment after discharge.  The patient does not know have transportation limitations that hinder transportation to clinic appointments.  .Services Needed at time of discharge: Y = Yes, Blank = No PT:   OT:   RN:   Equipment:   Other:     LOS: 1 day   Charlott Rakes, MD 06/20/2014, 12:08 PM

## 2014-06-20 NOTE — Progress Notes (Signed)
Pt had steady gait on ambulation with no difficulty.

## 2014-06-20 NOTE — Progress Notes (Signed)
UR completed 

## 2014-06-20 NOTE — Progress Notes (Signed)
[name] [name] wears 3L O2 at night when at home.

## 2014-06-20 NOTE — Progress Notes (Signed)
Reviewed discharge instruction with Patient and she verbalized understanding, No question  Verabalized. Patient IV removed

## 2014-06-20 NOTE — Discharge Instructions (Signed)
Thank you for trusting [place] with your medical care!  You were hospitalized for weakness and treated with IV fluids and magneisum.   Please take note of the following changes to your medications: -STOP iron tablets  To make sure you are getting better, please make it to the follow-up appointments listed on the first page.

## 2014-06-20 NOTE — Discharge Summary (Signed)
Name: [name] [name] MRN: [id number] DOB: March 24, 1956 59 y.o. PCP: Otho Bellows, MD  Date of Admission: 06/19/2014  4:56 PM Date of Discharge: 06/20/2014 Attending Physician: Sid Falcon, MD  Discharge Diagnosis: Hypomagnesemia Transaminitis Anemia of chronic disease CKD Stage 4 Chronic alcohol abuse Secondary hypoparathyroidism HTN CHF Chronic pancreatitis  Discharge Medications:   Medication List    STOP taking these medications        acetaminophen 500 MG tablet  Commonly known as:  TYLENOL     Vitamin D (Ergocalciferol) 50000 UNITS Caps capsule  Commonly known as:  DRISDOL      TAKE these medications        albuterol 108 (90 BASE) MCG/ACT inhaler  Commonly known as:  PROAIR HFA  Inhale 1-2 puffs into the lungs every 6 (six) hours as needed for wheezing or shortness of breath.     amLODipine 5 MG tablet  Commonly known as:  NORVASC  Take 1 tablet (5 mg total) by mouth daily.     calcitRIOL 0.25 MCG capsule  Commonly known as:  ROCALTROL  Take 0.25 mcg by mouth every other day.     calcium carbonate 500 MG chewable tablet  Commonly known as:  TUMS  Chew 6 tablets (1,200 mg of elemental calcium total) by mouth 3 (three) times daily. For bone health     cetirizine 10 MG tablet  Commonly known as:  ZYRTEC  Take 1 tablet (10 mg total) by mouth daily as needed for allergies.     colchicine 0.6 MG tablet  Take 0.5 tablets (0.3 mg total) by mouth daily.     feeding supplement (ENSURE COMPLETE) Liqd  Take 237 mLs by mouth 3 (three) times daily between meals.     FLUoxetine 10 MG capsule  Commonly known as:  PROZAC  Take 1 capsule (10 mg total) by mouth daily. For depression     folic acid 1 MG tablet  Commonly known as:  FOLVITE  Take 1 tablet (1 mg total) by mouth daily. For folic acid replacement     furosemide 40 MG tablet  Commonly known as:  LASIX  Take 0.5 tablets (20 mg total) by mouth daily.     gabapentin 300 MG capsule  Commonly  known as:  NEURONTIN  Take 2 capsules (600 mg total) by mouth 3 (three) times daily. For anxiety/pain control     guaiFENesin-dextromethorphan 100-10 MG/5ML syrup  Commonly known as:  ROBITUSSIN DM  Take 5 mLs by mouth every 4 (four) hours as needed for cough.     lipase/protease/amylase 12000 UNITS Cpep capsule  Commonly known as:  CREON  Take 1 capsule (12,000 Units total) by mouth 3 (three) times daily before meals.     lisinopril 10 MG tablet  Commonly known as:  PRINIVIL,ZESTRIL  Take 1 tablet (10 mg total) by mouth daily.     magnesium oxide 400 MG tablet  Commonly known as:  MAG-OX  Take 400 mg by mouth 4 (four) times daily.     metoprolol tartrate 25 MG tablet  Commonly known as:  LOPRESSOR  Take 12.5 mg by mouth 2 (two) times daily.     multivitamin with minerals Tabs tablet  Take 1 tablet by mouth daily. For vitamin replacement     omeprazole 40 MG capsule  Commonly known as:  PRILOSEC  Take 1 capsule (40 mg total) by mouth daily.     sodium bicarbonate 650 MG tablet  Take 1 tablet (650 mg  total) by mouth daily.     thiamine 100 MG tablet  Commonly known as:  VITAMIN B-1  Take 1 tablet (100 mg total) by mouth daily. For low thiamine     vitamin B-12 250 MCG tablet  Commonly known as:  CYANOCOBALAMIN  Take 1 tablet (250 mcg total) by mouth every evening.        Disposition and follow-up:   [name] [name] was discharged from [facility] in Stable condition.  At the hospital follow up visit please address:  -Hemochromatosis: consider workup given elevated ferritin -Magnesium: reheck -EtOH abuse: revisit -Polypharmacy: wean colchicine if no longer indicated  2.  Labs / imaging needed at time of follow-up:  -Mg  3.  Pending labs/ test needing follow-up:  -Autoimmune workup  Follow-up Appointments:     Follow-up Information    Follow up with Otho Bellows, MD On 06/23/2014.   Specialty:  Internal Medicine   Why:  [phone]    Contact information:   [place] [address] [phone]       Discharge Instructions: Discharge Instructions    Call MD for:  persistant dizziness or light-headedness    Complete by:  As directed      Call MD for:  persistant nausea and vomiting    Complete by:  As directed      Call MD for:  severe uncontrolled pain    Complete by:  As directed      Call MD for:  temperature >100.4    Complete by:  As directed      Increase activity slowly    Complete by:  As directed            Consultations:    Procedures Performed:  US Abdomen Limited Ruq  06/20/2014   CLINICAL DATA:  Elevated liver function tests.  Pancreatitis.  EXAM: US ABDOMEN LIMITED - RIGHT UPPER QUADRANT  COMPARISON:  06/30/2013  FINDINGS: Gallbladder:  A small amount of echogenic debris is seen within the gallbladder, however no gallstones are identified. No evidence of gallbladder wall thickening. No sonographic Murphy sign noted by sonographer.  Common bile duct:  Diameter: 2 mm  Liver:  No focal lesion identified. Within normal limits in parenchymal echogenicity.  IMPRESSION: Small amount of gallbladder sludge noted, without definite gallstones. No other hepatobiliary abnormality visualized.   Electronically Signed   By: Earle Gell M.D.   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 She told Dr Clayburn Pert that she has been feeling fatigued with cold hands and feet for about a month. She says the tips of her fingers and teos were turning white (she is African-American) when she goes out in the cold. She has a dry cough in the morning with rare post-tussive emesis. She says she is eating less since she started  drinking alcohol frequently around the holidays. She says she is taking iron supplements but thinks Dr Justin Mend (renal) wants IV iron replacement. She says she will be starting HD soon but does not have a fistula. She began drinking alcohol again around the holidays. She drinks 2-3 glasses of wine a day. Last drink 1/14/ after clinic. She denies any fevers, chills, night  sweats, chest pain, SOB, palpitations, abdominal pain, diarrhea, constipation, headache, dizziness, lightheadedness. She notes she takes "26 pills a day" but does not think she has problems keeping track of them.  Initial work-up by Dr Eulas Post for fatigue noted her hemoglobin was 8 (at baseline), TSH normal, anemia panel with low folate, ferritin 2973, UIBC < 15, and TIBC not calculated, B12 normal. Corrected calcium of 7.4. She had a bicarbonate of 14 which is stable from 04/2014 but she is reportedly taking sodium bicarbonate supplementation. Her magnesium was very low at 0.9. The decision was made that the patient should be admitted for observation on telemetry, electrolyte replacement, and acidosis correction.   Hospital Course by problem list:   Weakness: Likely due to hypomagnesemia stemming from chronic EtOH abuse. On admission, she appeared intoxicated which may have confounded her initial report of symptoms. She was given Mg 4g IV with Mg 2.9 at the time of discharge. Per RN assessment, she was ambulating well and agreeable to discharge.   Transaminitis: Likely 2/2 chronic EtOH abuse though AST/ALT ratio was 6:1 [greater than classic >2:1]. Abdominal US was remarkable only for biliary sludge but no other acute process. Her hepatitis panel was also unremarkable, and she was tolerating PO intake.  Anemia of chronic disease: She reported not having taken her Fe therapy for two months but was found to have the following results on anemia panel. She was asked to stop taking Fe therapy until she could be reassessed at the time of follow-up.  Hemochromatosis gene testing [X726O & H63D] should be considered along with follow-up of autoimmune panel.  Iron/TIBC/Ferritin/ %Sat    Component Value Date/Time   IRON 173* 06/18/2014 1620   TIBC NOT CALC 06/18/2014 1620   FERRITIN 2973* 06/18/2014 1620   IRONPCTSAT NOT CALC 06/18/2014 1620   IRONPCTSAT 74* 11/26/2013 1030   CKD Stage 4: Crt remained stage at 2 during her hospital stay, and she reported having follow-up with her nephrologist Dr. Justin Mend later confirmed in the system to be 07/28/14.  Chronic alcohol abuse: CIWA was 0 overnight. She reported having recently completed a rehab program and was encouraged to continue working on her drinking habit.   HTN: BP was normotensive off her home meds each of which was restarted gradually. Last BP was 134/91 on her home medications: Lasix, metoprolol, lisinopril, amlodipine.  CHF: Lasix as noted above.  Secondary hypoparathyroidism: Her home CaCO3 & calcitriol were held on admission though resumed at the time of discharge.  Chronic pancreatitis: Remained stable on home medications.      Discharge Vitals:   BP 134/91 mmHg  Pulse 80  Temp(Src) 99 F (37.2 C) (Oral)  Resp 15  Wt 85 lb 1.6 oz (38.6 kg)  SpO2 100%  Discharge Labs:  Results for orders placed or performed during the hospital encounter of 06/19/14 (from the past 24 hour(s))  APTT     Status: None   Collection Time: 06/19/14 11:30 PM  Result Value Ref Range   aPTT 36 24 - 37 seconds  Ethanol     Status: Abnormal   Collection Time: 06/19/14 11:30 PM  Result Value Ref Range   Alcohol, Ethyl (B) 120 (H) 0 - 9 mg/dL  Gamma GT     Status: Abnormal   Collection Time: 06/19/14 11:30 PM  Result Value Ref Range   GGT 616 (H) 7 - 51 U/L  Lactate dehydrogenase     Status: Abnormal   Collection Time: 06/19/14 11:30 PM  Result Value Ref  Range   LDH 387 (H) 94 - 250 U/L  Protime-INR     Status: Abnormal   Collection Time: 06/19/14 11:30 PM  Result Value Ref Range    Prothrombin Time 16.2 (H) 11.6 - 15.2 seconds   INR 1.28 0.00 - 1.49  Technologist smear review     Status: None   Collection Time: 06/19/14 11:30 PM  Result Value Ref Range   Tech Review MORPHOLOGY UNREMARKABLE   Magnesium     Status: Abnormal   Collection Time: 06/19/14 11:30 PM  Result Value Ref Range   Magnesium 3.6 (H) 1.5 - 2.5 mg/dL  Magnesium     Status: Abnormal   Collection Time: 06/20/14  5:40 AM  Result Value Ref Range   Magnesium 2.9 (H) 1.5 - 2.5 mg/dL  Sedimentation rate     Status: Abnormal   Collection Time: 06/20/14 11:08 AM  Result Value Ref Range   Sed Rate 138 (H) 0 - 22 mm/hr    Signed: Charlott Rakes, MD 06/22/2014, 11:56 AM    Services Ordered on Discharge: None Equipment Ordered on Discharge: None

## 2014-06-20 NOTE — Progress Notes (Signed)
Nutrition Brief Note  MD Consult  Pt being discharged home, in street clothes ready to go home. Encouraged pt to continue to drink ensure at home and eat frequent meals/snacks.    Wt Readings from Last 15 Encounters:  06/20/14 85 lb 1.6 oz (38.6 kg)  06/18/14 85 lb 6.4 oz (38.737 kg)  05/06/14 85 lb 9.6 oz (38.828 kg)  01/14/14 101 lb 12.8 oz (46.176 kg)  01/01/14 104 lb 4.8 oz (47.31 kg)  12/29/13 104 lb (47.174 kg)  12/25/13 104 lb (47.174 kg)  12/18/13 99 lb 3.3 oz (45 kg)  12/17/13 99 lb (44.906 kg)  12/17/13 99 lb 9.6 oz (45.178 kg)  12/12/13 97 lb 6.4 oz (44.18 kg)  12/09/13 99 lb 1.6 oz (44.951 kg)  11/28/13 98 lb 12.8 oz (44.815 kg)  11/26/13 100 lb 14.4 oz (45.768 kg)  10/29/13 91 lb (41.277 kg)    Body mass index is 16.09 kg/(m^2). Patient meets criteria for underweight based on current BMI.   No nutrition interventions warranted at this time. If nutrition issues arise, please consult RD.   [place], [place], [place] Pager [phone] After Hours Pager

## 2014-06-22 LAB — ANTI-SMITH ANTIBODY: ENA SM Ab Ser-aCnc: >8

## 2014-06-22 LAB — ANTI-DNA ANTIBODY, DOUBLE-STRANDED: DS DNA AB: 19 [IU]/mL — AB

## 2014-06-22 LAB — VITAMIN D 25 HYDROXY (VIT D DEFICIENCY, FRACTURES)

## 2014-06-22 LAB — CYCLIC CITRUL PEPTIDE ANTIBODY, IGG: Cyclic Citrullin Peptide Ab: <2 U/mL (ref 0.0–5.0)

## 2014-06-23 ENCOUNTER — Encounter: Payer: Self-pay | Admitting: Internal Medicine

## 2014-06-23 ENCOUNTER — Ambulatory Visit (INDEPENDENT_AMBULATORY_CARE_PROVIDER_SITE_OTHER): Payer: Medicare Other | Admitting: Internal Medicine

## 2014-06-23 VITALS — BP 150/94 | HR 104 | Temp 98.1°F | Ht 61.0 in | Wt 86.7 lb

## 2014-06-23 DIAGNOSIS — M329 Systemic lupus erythematosus, unspecified: Secondary | ICD-10-CM | POA: Diagnosis not present

## 2014-06-23 DIAGNOSIS — N184 Chronic kidney disease, stage 4 (severe): Secondary | ICD-10-CM

## 2014-06-23 DIAGNOSIS — D649 Anemia, unspecified: Secondary | ICD-10-CM | POA: Diagnosis not present

## 2014-06-23 DIAGNOSIS — E559 Vitamin D deficiency, unspecified: Secondary | ICD-10-CM

## 2014-06-23 LAB — C3 COMPLEMENT: C3 COMPLEMENT: 240 mg/dL — AB (ref 90–180)

## 2014-06-23 LAB — TRANSFERRIN: Transferrin: 211 mg/dL — ABNORMAL LOW (ref 188–341)

## 2014-06-23 LAB — ANTIPHOSPHOLIPID SYNDROME EVAL, BLD
ANTICARDIOLIPIN IGA: 15 U/mL (ref <22)
ANTICARDIOLIPIN IGG: 3 GPL U/mL — AB (ref <23)
Anticardiolipin IgM: 3 MPL U/mL — ABNORMAL LOW (ref <11)
DRVVT: 63.2 secs — ABNORMAL HIGH (ref <42.9)
LUPUS ANTICOAGULANT: NOT DETECTED
PHOSPHATYDALSERINE, IGG: 13 U/mL (ref <16)
PTT Lupus Anticoagulant: 59.4 secs — ABNORMAL HIGH (ref 28.0–43.0)
PTTLA 41 MIX: 46.8 s — AB (ref 28.0–43.0)
PTTLA Confirmation: 0 secs (ref <8.0)
Phosphatydalserine, IgA: 17 U/mL (ref <20)
Phosphatydalserine, IgM: 7 U/mL (ref <22)
dRVVT Incubated 1:1 Mix: 37.6 secs (ref <42.9)

## 2014-06-23 LAB — ANTI-SMOOTH MUSCLE ANTIBODY, IGG: F-Actin IgG: 21 U — ABNORMAL HIGH (ref <20)

## 2014-06-23 LAB — C4 COMPLEMENT: COMPLEMENT C4, BODY FLUID: 50 mg/dL — AB (ref 10–40)

## 2014-06-23 NOTE — Patient Instructions (Signed)
Based on all of our lab work, you have Lupus, and you will need to see a Rheumatologist- I will make this referral today.  Continue to stay away from drinking alcohol.   Return to the clinic in 1 month for a check up.    Lupus Lupus (also called systemic lupus erythematosus, SLE) is a disorder of the body's natural defense system (immune system). In lupus, the immune system attacks various areas of the body (autoimmune disease). CAUSES The cause is unknown. However, lupus runs in families. Certain genes can make you more likely to develop lupus. It is 10 times more common in women than in men. Lupus is also more common in African Americans and Asians. Other factors also play a role, such as viruses (Epstein-Barr virus, EBV), stress, hormones, cigarette smoke, and certain drugs. SYMPTOMS Lupus can affect many parts of the body, including the joints, skin, kidneys, lungs, heart, nervous system, and blood vessels. The signs and symptoms of lupus differ from person to person. The disease can range from mild to life-threatening. Typical features of lupus include:  Butterfly-shaped rash over the face.  Arthritis involving one or more joints.  Kidney disease.  Fever, weight loss, hair loss, fatigue.  Poor circulation in the fingers and toes (Raynaud's disease).  Chest pain when taking deep breaths. Abdominal pain may also occur.  Skin rash in areas exposed to the sun.  Sores in the mouth and nose. DIAGNOSIS Diagnosing lupus can take a long time and is often difficult. An exam and an accurate account of your symptoms and health problems is very important. Blood tests are necessary, though no single test can confirm or rule out lupus. Most people with lupus test positive for antinuclear antibodies (ANA) on a blood test. Additional blood tests, a urine test (urinalysis), and sometimes a kidney or skin tissue sample (biopsy) can help to confirm or rule out lupus. TREATMENT There is no cure for  lupus. Your caregiver will develop a treatment plan based on your age, sex, health, symptoms, and lifestyle. The goals are to prevent flares, to treat them when they do occur, and to minimize organ damage and complications. How the disease may affect each person varies widely. Most people with lupus can live normal lives, but this disorder must be carefully monitored. Treatment must be adjusted as necessary to prevent serious complications. Medicines used for treatment:  Nonsteroidal anti-inflammatory drugs (NSAIDs) decrease inflammation and can help with chest pain, joint pain, and fevers. Examples include ibuprofen and naproxen.  Antimalarial drugs were designed to treat malaria. They also treat fatigue, joint pain, skin rashes, and inflammation of the lungs in patients with lupus.  Corticosteroids are powerful hormones that rapidly suppress inflammation. The lowest dose with the highest benefit will be chosen. They can be given by cream, pills, injections, and through the vein (intravenously).  Immunosuppressive drugs block the making of immune cells. They may be used for kidney or nerve disease. HOME CARE INSTRUCTIONS  Exercise. Low-impact activities can usually help keep joints flexible without being too strenuous.  Rest after periods of exercise.  Avoid excessive sun exposure.  Follow proper nutrition and take supplements as recommended by your caregiver.  Stress management can be helpful. SEEK MEDICAL CARE IF:  You have increased fatigue.  You develop pain.  You develop a rash.  You have an oral temperature above 102 F (38.9 C).  You develop abdominal discomfort.  You develop a headache.  You experience dizziness. [facility] of Neurological  Disorders and Stroke: MasterBoxes.it American College of Rheumatology: www.rheumatology.The [facility] of Arthritis and Musculoskeletal and Skin Diseases: www.niams.SouthExposed.es Document Released:  05/12/2002 Document Revised: 08/14/2011 Document Reviewed: 09/02/2009 [facility] Patient Information 2015 [place], [place]. This information is not intended to replace advice given to you by your health care provider. Make sure you discuss any questions you have with your health care provider.   General Instructions:   Please bring your medicines with you each time you come to clinic.  Medicines may include prescription medications, over-the-counter medications, herbal remedies, eye drops, vitamins, or other pills.   Progress Toward Treatment Goals:  Treatment Goal 05/06/2014  Blood pressure at goal  Prevent falls -    Self Care Goals & Plans:  Self Care Goal 05/06/2014  Manage my medications take my medicines as prescribed; bring my medications to every visit; refill my medications on time; follow the sick day instructions if I am sick  Monitor my health keep track of my weight  Eat healthy foods eat more vegetables; eat fruit for snacks and desserts; eat foods that are low in salt  Be physically active find an activity I enjoy  Meeting treatment goals -    No flowsheet data found.   Care Management & Community Referrals:  Referral 12/25/2013  Referrals made for care management support -  Referrals made to community resources none

## 2014-06-23 NOTE — Assessment & Plan Note (Signed)
Mg 0.9 06/18/14 and was replaced with 4mg  IV Mg. Mg 2.9 on 06/20/14. Pt on oral MgOx but restarted EtOH use. She denies any EtOH use since 06/19/14.  - Rechecking Mg today - Continue oral supplementation - Encouraged continue EtOH cessation

## 2014-06-23 NOTE — Assessment & Plan Note (Addendum)
She was recently found to have a very high ferritin at almost 3000. CRP was normal but sed rate was elevated. Pt with positive ANA, ds-DNA Ab, and Anti Sm-Ab, giving the diagnosis of SLE, which is likely the source of her elevated ferritin. Apparently in the past she was told that she had lupus, although this was not documented anywhere until recently. She does not seem to be in an acute flare, has no rashes, joint pain, fevers, chest pain, so steroid therapy is not indicated at this time. She will require long-term maintenance therapy, so will refer to Rheumatology. Pt provided information on lupus and was notified that she could have an acute flare, in which case she needs immediate treatment. She acknowledged understanding and stated that her mother had lupus, so she knows what signs to look for, including increased fatigue, abdominal pain, acute skin changes/rash, headaches, chest pain, or fevers.  - Referring to Rheumatology

## 2014-06-23 NOTE — Progress Notes (Signed)
[name], female   DOB: Oct 30, 1955, 59 y.o.   MRN: [id number]  Subjective:   [name] female   DOB: 1956/04/09 59 y.o.   MRN: [id number]  HPI: [name] is a 59 y.o. 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   She has started drinking again and her Mg was 0.9 06/19/14. She was asked to come to the hospital for replacement. Mg was supplemented and was 2.9 the next day at discharge.   She was recently found to have a very high ferritin at almost 3000. CRP was normal but sed rate was elevated. All iron supplementation was stopped. Rheumatic labs with positive ANA with elevated titer, + ds-DNA Ab, and + Anti-Sm Ab, all confirming SLE.    Past Medical History  Diagnosis Date  . Anemia, B12 deficiency   . History of acute pancreatitis   . Right knee pain     No recent imaging on chart  . 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 Dr. Mancel Bale, [facility]  . Hypertriglyceridemia   . GERD (gastroesophageal reflux disease)   . Subdural hematoma 02/2008    Likely 2/2 trauma from seizure from EtOH withdrawal, chronic in nature, sees Dr. Jerene Bears. Most recent CT head 10/2009 showing stable but persistent hematoma without mass effect.  . History of seizure disorder     Likely 2/2 alcohol abuse  . Hypocalcemia   . Hypomagnesemia   . Failure to thrive in childhood     Unclear etiology  . HTN (hypertension)   . Thrombocytopenia   . Hepatomegaly     On exam  . Joint pain   . Alcohol abuse   . Vitamin D deficiency   . Pancreatitis   . Insomnia   . Hyperlipidemia   . Pernicious anemia   . Macrocytic anemia   . 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 Depression    . Fx humeral neck 04/17/2011    Transverse fracture- minimally displaced- managed as outpatient   . ABNORMAL PAP SMEAR, LGSIL 07/23/2008    Annotation: HPV positive CIN I Dr. Mancel Bale, [facility] Qualifier: Diagnosis of  By: Oretha Ellis    . Pneumonia 05/20/2012  . Arthritis     "shoulders" (08/15/2013)  . 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  Marland Kitchen Chronic diastolic CHF (congestive heart failure)   . Hypomagnesemia   . Seizures     "don't know when/why I had them; daughter was always there w/me"  . On home oxygen therapy     "3L; mostly at night" (06/19/2014)   Current Outpatient Prescriptions  Medication Sig Dispense Refill  . albuterol (PROAIR HFA) 108 (90 BASE) MCG/ACT inhaler Inhale 1-2 puffs into the lungs every 6 (six) hours as needed for wheezing or shortness of breath. 1 Inhaler 1  . amLODipine (NORVASC) 5 MG tablet Take 1 tablet (5 mg total) by mouth daily. 90 tablet 3  . calcitRIOL (ROCALTROL) 0.25 MCG capsule Take 0.25 mcg by mouth every other day.     . calcium carbonate (TUMS) 500 MG chewable tablet Chew 6 tablets (1,200 mg of elemental  calcium total) by mouth 3 (three) times daily. For bone health 90 tablet 3  . cetirizine (ZYRTEC) 10 MG tablet Take 1 tablet (10 mg total) by mouth daily as needed for allergies. 30 tablet 6  . colchicine 0.6 MG tablet Take 0.5 tablets (0.3 mg total) by mouth daily. 40 tablet 0  . feeding supplement, ENSURE COMPLETE, (ENSURE COMPLETE) LIQD Take 237 mLs by mouth 3 (three) times daily between meals. 90 Bottle 1  . FLUoxetine (PROZAC) 10 MG capsule Take 1 capsule (10 mg total) by mouth daily. For depression 90 capsule 3  . folic acid (FOLVITE) 1 MG tablet Take 1 tablet (1 mg total) by mouth daily. For folic acid replacement 30 tablet 6  . furosemide (LASIX) 40 MG tablet Take 0.5 tablets (20 mg total) by mouth daily. 45 tablet 4  . gabapentin (NEURONTIN) 300 MG capsule Take 2 capsules (600  mg total) by mouth 3 (three) times daily. For anxiety/pain control 540 capsule 1  . guaiFENesin-dextromethorphan (ROBITUSSIN DM) 100-10 MG/5ML syrup Take 5 mLs by mouth every 4 (four) hours as needed for cough. 118 mL 0  . lipase/protease/amylase (CREON) 12000 UNITS CPEP capsule Take 1 capsule (12,000 Units total) by mouth 3 (three) times daily before meals. 270 capsule 3  . lisinopril (PRINIVIL,ZESTRIL) 10 MG tablet Take 1 tablet (10 mg total) by mouth daily. 90 tablet 4  . magnesium oxide (MAG-OX) 400 MG tablet Take 400 mg by mouth 4 (four) times daily.    . metoprolol tartrate (LOPRESSOR) 25 MG tablet Take 12.5 mg by mouth 2 (two) times daily.    . Multiple Vitamin (MULTIVITAMIN WITH MINERALS) TABS tablet Take 1 tablet by mouth daily. For vitamin replacement (Patient not taking: Reported on 06/19/2014) 90 tablet 4  . omeprazole (PRILOSEC) 40 MG capsule Take 1 capsule (40 mg total) by mouth daily. 90 capsule 3  . sodium bicarbonate 650 MG tablet Take 1 tablet (650 mg total) by mouth daily. 30 tablet 3  . thiamine (VITAMIN B-1) 100 MG tablet Take 1 tablet (100 mg total) by mouth daily. For low thiamine 90 tablet 3  . vitamin B-12 (CYANOCOBALAMIN) 250 MCG tablet Take 1 tablet (250 mcg total) by mouth every evening. 90 tablet 3   No current facility-administered medications for this visit.   Family History  Problem Relation Age of Onset  . Cancer Mother     Died from stomach cancer and "flesh eating rash  . Heart failure Father     Died in 14s from an MI  . Alcohol abuse Sister     Twin sister drinks a lot, as did both her parents and brothers  . Stroke Brother     Has 7 brothers, 1 with CVA  . Lupus Mother    History   Social History  . Marital Status: Divorced    Spouse Name: N/A    Number of Children: N/A  . Years of Education: N/A   Social History Main Topics  . Smoking status: Former Smoker -- 0.50 packs/day for 40 years    Types: Cigarettes    Quit date: 09/20/2010  .  Smokeless tobacco: Never Used  . Alcohol Use: Yes     Comment: 06/19/2014 "graduated from [employer] (for alcohol abuse) in October 2015"   . Drug Use: Yes    Special: Marijuana, Cocaine     Comment: 06/19/2014 "last drug use was in 2012"  . Sexual Activity: Not Currently   Other Topics Concern  .  None   Social History Narrative   Lives with her significant other and 2 grandchildren. 1 child   Has 7 brothers and 4 sisters, 1 twin sister.   Unemployed, worked in [employer].    Abuses alcohol-drinks 1 glass of wine daily    No drug use. Former cigarette use quit 1.5 years ago.     11 th grade education            Review of Systems: Constitutional: Denies fever, appetite change. +fatigue.  HEENT: Denies vision changes or neck pain   Respiratory: Denies SOB, DOE Cardiovascular: Denies chest pain or leg swelling.  Gastrointestinal: Denies nausea, vomiting, abdominal pain, diarrhea, constipation Genitourinary: Denies dysuria Musculoskeletal: +pain in legs with ambulation Skin: Denies rash or wound.  Neurological: Denies dizziness, seizures, syncope, weakness, or headaches.  Psychiatric/Behavioral: Denies suicidal ideation, mood changes, or confusion.   Objective:  Physical Exam: Filed Vitals:   06/23/14 0851  BP: 150/94  Pulse: 104  Temp: 98.1 F (36.7 C)  TempSrc: Oral  Height: 5\' 1"  (1.549 m)  Weight: 86 lb 11.2 oz (39.327 kg)  SpO2: 98%   Constitutional: Vital signs reviewed.  Patient is a well-developed and well-nourished female in no acute distress and cooperative with exam. Alert and oriented x3.  Head: Normocephalic and atraumatic Eyes: PERRL, EOMI Neck: No JVD Cardiovascular: RRR, no MRG, 2+ DP pulses Pulmonary/Chest: Normal respiratory effort, CTAB, no wheezes, rales, or rhonchi Abdominal: Soft. Non-tender, non-distended, bowel sounds are normal, Musculoskeletal: No joint deformities, erythema, or stiffness, ROM full and no nontender Neurological:  A&O x3, no focal motor deficit Skin: Warm, dry and intact. No rash. +clubbing.  Psychiatric: Normal mood and affect. Speech and behavior is normal.   Assessment & Plan:   Please refer to Problem List based Assessment and Plan

## 2014-06-23 NOTE — Addendum Note (Signed)
Addended by: Clayburn Pert F on: 06/23/2014 10:05 AM   Modules accepted: Medications

## 2014-06-23 NOTE — Assessment & Plan Note (Addendum)
She was recently found to have a very high ferritin at almost 3000. CRP was normal but sed rate was elevated. Possibly from EtOH use causing a hepatitis, AST elevated to 195. All iron supplementation has been stopped. Possibly iatrogenic from iron supplementation in the setting of inflammation of the liver 2/2 EtOH use, but hemochromatosis could be a possibility but is less likely. Pt with positive ANA, ds-DNA Ab, and Anti Sm-Ab which is likely the source of her elevated ferritin. She does not seem to be in an acute flare, so steroid therapy is not needed at this time. She will require long-term maintenance therapy.  - Continuing to hold her iron supplementation - Referring to Rheumatology

## 2014-06-24 DIAGNOSIS — J9620 Acute and chronic respiratory failure, unspecified whether with hypoxia or hypercapnia: Secondary | ICD-10-CM | POA: Diagnosis not present

## 2014-06-25 ENCOUNTER — Other Ambulatory Visit: Payer: Self-pay | Admitting: Internal Medicine

## 2014-06-25 DIAGNOSIS — E2839 Other primary ovarian failure: Secondary | ICD-10-CM

## 2014-06-25 NOTE — Progress Notes (Signed)
Internal Medicine Clinic Attending  Case discussed with Dr. Glenn soon after the resident saw the patient.  We reviewed the resident's history and exam and pertinent patient test results.  I agree with the assessment, diagnosis, and plan of care documented in the resident's note. 

## 2014-07-02 ENCOUNTER — Ambulatory Visit
Admission: RE | Admit: 2014-07-02 | Discharge: 2014-07-02 | Disposition: A | Discharge status: Home / Self Care | Payer: Medicare Other | Source: Ambulatory Visit | Attending: Internal Medicine | Admitting: Internal Medicine

## 2014-07-02 DIAGNOSIS — M810 Age-related osteoporosis without current pathological fracture: Secondary | ICD-10-CM | POA: Diagnosis not present

## 2014-07-02 DIAGNOSIS — Z1231 Encounter for screening mammogram for malignant neoplasm of breast: Secondary | ICD-10-CM | POA: Diagnosis not present

## 2014-07-02 DIAGNOSIS — E2839 Other primary ovarian failure: Secondary | ICD-10-CM

## 2014-07-14 ENCOUNTER — Other Ambulatory Visit: Payer: Self-pay | Admitting: Internal Medicine

## 2014-07-14 DIAGNOSIS — M810 Age-related osteoporosis without current pathological fracture: Secondary | ICD-10-CM

## 2014-07-14 MED ORDER — VITAMIN D (ERGOCALCIFEROL) 1.25 MG (50000 UNIT) PO CAPS: 50000.0000 [IU] | ORAL_CAPSULE | ORAL | Status: DC

## 2014-07-14 NOTE — Progress Notes (Signed)
Recent DEXA with osteoporosis  FINDINGS: AP LUMBAR SPINE L1 through L4  Bone Mineral Density (BMD): 0.795 g/cm2  Young Adult T-Score: -2.3  Z-Score: -1.9  LEFT FEMUR TOTAL  Bone Mineral Density (BMD): 0.551 g/cm2  Young Adult T-Score: -3.2  Z-Score: -2.4  ASSESSMENT: Patient's diagnostic category is OSTEOPOROSIS by WHO Criteria.  FRACTURE RISK: Increased  FRAX: Not assessed, some T-scores at or below -2.5  COMPARISON: 01/25/2006. 14% decrease in density in the lumbar spine since the prior exam. 15.4% decrease in density for the proximal left femur.  She is currently on Vit D 50,000 units q week, Calcium carbonate 1200mg  TID, and Calcitriol 0.74mcg qd (rx ordered for qod, but pt states that she is taking qday). I suspect that her osteoporosis is worsened 2/2 to her CKD 4 and secondary hyperparathyroidism. Unfortunately in the setting of her CKD, she is not a candidate for bisphosphonate therapy. I spoke to Dr. Justin Mend, Mrs[facility] Nephrologist, who recommended starting SERM (Raloxifene) or Prolia (denosumab). In the setting of her advanced kidney disease, we discussed talking with Endocrinology to see what therapy would be the best for the patient. I contacted Dr. Alben Spittle with [facility], who agreed that Raloxifene or Prolia would be the best options, and Prolia has the safest profile for kidney disease. He stated that for patients with secondary hyperparathyroidism, Forteo, which is a recombinant human parathyroid hormone, is not recommended, as it can worsen the disease, as it contributes to a continuously elevated PTH level instead of receiving the drug intermittently which has been show to aid in bone formation. Dr. Buddy Duty felt that in this patient given her complex medical history, an Endocrine consult would be appropriate for further evaluation and management of her osteoporosis. Since Mrs. Allie does not have an acute fracture, will hold on starting a new  therapy and will refer her to Endocrinology. Mrs. Dave was notified and is in agreement.  - Referring to Endocrinology

## 2014-07-23 ENCOUNTER — Encounter: Payer: Self-pay | Admitting: Internal Medicine

## 2014-07-23 ENCOUNTER — Ambulatory Visit (INDEPENDENT_AMBULATORY_CARE_PROVIDER_SITE_OTHER): Payer: Medicare Other | Admitting: Internal Medicine

## 2014-07-23 DIAGNOSIS — F1021 Alcohol dependence, in remission: Secondary | ICD-10-CM

## 2014-07-23 DIAGNOSIS — E559 Vitamin D deficiency, unspecified: Secondary | ICD-10-CM

## 2014-07-23 DIAGNOSIS — M329 Systemic lupus erythematosus, unspecified: Secondary | ICD-10-CM

## 2014-07-23 LAB — MAGNESIUM: Magnesium: 0.7 mg/dL — CL (ref 1.5–2.5)

## 2014-07-23 NOTE — Progress Notes (Addendum)
   Subjective:    [name], female    DOB: 1956/01/24, 59 y.o.   MRN: [id number]  HPI [name] [name] is a 59 year old female with CKD Stage 4 secondary to FSGS, history of alcohol abuse, anemia of chronic disease who was recently diagnosed with lupus. Please see assessment & plan for documentation of each problem.   Review of Systems  Constitutional: Negative for fever.  Respiratory: Negative for shortness of breath.   Cardiovascular: Negative for chest pain.  Gastrointestinal: Negative for nausea, abdominal pain and diarrhea.  Neurological: Negative for dizziness and weakness.       Objective:   Physical Exam Constitutional: She is oriented to person, place, and time. Thin appearing. No distress.  HENT:  Head: Normocephalic and atraumatic.  Eyes: Conjunctivae are normal. Pupils are equal, round, and reactive to light.  Cardiovascular: Normal rate, regular rhythm and normal heart sounds.  Exam reveals no gallop and no friction rub.   No murmur heard. Pulmonary/Chest: Effort normal. No respiratory distress. She has no wheezes. She has no rales.  Abdominal: Soft. Bowel sounds are normal. She exhibits no distension. There is no tenderness.  Neurological: She is alert and oriented to person, place, and time. Coordination normal.  Skin: Skin is warm and dry. She is not diaphoretic.  Psychiatric: Her behavior is normal.         Assessment & Plan:

## 2014-07-23 NOTE — Assessment & Plan Note (Signed)
Overview -DEXA scan done last month was remarkable for left femur T score -3.2, lumbar spine T score -2.3 -She reports that she is still taking her vitamin D 50,000 units every week along with Tums 6 tablets 3 times a day with meals  Assessment -Osteoporosis likely multifactorial in the setting of her chronic kidney disease, lupus, chronic alcohol use though in remission -Per KDIGO guidelines in 2009, were she to start bisphosphonate therapy, she would need a bone biopsy though more recent revision in 2013 of these recommendations has called this into question and actually encourages starting bisphosphonate therapy in the absence of elevated phosphorus or hyperparathyroidism. Phosphorus was 3.0 on 06/18/14, PTH was 61.6 on 04/03/12 - both of which are within the reference range of normal.  Plan -Consider bisphosphonate therapy after discussing with her nephrologist

## 2014-07-23 NOTE — Assessment & Plan Note (Signed)
-  P-er the last status update of her referral, it appears that she is already set up with Dr. Elmon Else office.  -Encouraged her today to set up an appointment with them to further workup and management lupus.

## 2014-07-23 NOTE — Assessment & Plan Note (Addendum)
-  She reports that she has not had a single drink since prior to her last hospitalization.  -To curb her cravings, she has taken interest in reading books and cooking various delicacies. I encouraged her to keep up the good work.

## 2014-07-23 NOTE — Assessment & Plan Note (Addendum)
Overview -Magnesium 2.9 on 06/20/14 -Reports taking her magnesium oxide 400 mg 4 times daily.  -She denies any new weakness.  -Her last drink was prior to her last hospitalization in January.  Assessment -Unable to assess response to therapy  Plan -Recheck magnesium today  ADDENDUM 07/23/2014  12:22 PM:  -Magnesium 0.7 on recheck today which is critically low -Causes for low magnesium includes GI losses and renal losses. She denied any GI symptoms to me like abdominal pain, nausea, vomiting, diarrhea though is on Creon which may be related to chronic malabsorption. PPIs may also affect her ability to absorb magnesium, and she has been on Prilosec since at least 2012. Renal losses are possible given stage IV CKD. A1c was 5.1 when last checked on 06/19/14 which makes diabetes unlikely as a contributor. Corrected calcium was 7.4 when last checked on 06/18/14 makes hypercalcemia less likely as well. Her self-report, she has not relapsed into her alcoholism. Sure if she has been worked up for other renal genetic disorders like Bartter/Gittelman syndrome -Stop PPI for now and ask her to continue magnesium oxide -Call her back for repeat check of magnesium next Monday -Contact her nephrologist to see if he has ever considered renal causes of low magnesium for her

## 2014-07-23 NOTE — Patient Instructions (Signed)
  Please try to bring all your medicines next time. This will help [place] keep you safe from mistakes.  We stopped taking colchicine as you no longer need it. We will also call you about your magnesium level.  For your lupus, please call the Dr. Elmon Else office for an appointment, [phone]  We will also talk to Dr. Justin Mend about what to do for your bones.  Please see [place] back next month. Keep up the good work!

## 2014-07-25 DIAGNOSIS — J9620 Acute and chronic respiratory failure, unspecified whether with hypoxia or hypercapnia: Secondary | ICD-10-CM | POA: Diagnosis not present

## 2014-07-27 NOTE — Progress Notes (Signed)
Agree with your plan.  Good work.

## 2014-07-28 DIAGNOSIS — N183 Chronic kidney disease, stage 3 (moderate): Secondary | ICD-10-CM | POA: Diagnosis not present

## 2014-07-28 DIAGNOSIS — I129 Hypertensive chronic kidney disease with stage 1 through stage 4 chronic kidney disease, or unspecified chronic kidney disease: Secondary | ICD-10-CM | POA: Diagnosis not present

## 2014-07-28 DIAGNOSIS — N2581 Secondary hyperparathyroidism of renal origin: Secondary | ICD-10-CM | POA: Diagnosis not present

## 2014-07-28 DIAGNOSIS — D631 Anemia in chronic kidney disease: Secondary | ICD-10-CM | POA: Diagnosis not present

## 2014-07-30 NOTE — Progress Notes (Signed)
Internal Medicine Clinic Attending  Case discussed with Dr. Patel at the time of the visit.  We reviewed the resident's history and exam and pertinent patient test results.  I agree with the assessment, diagnosis, and plan of care documented in the resident's note.  

## 2014-08-05 ENCOUNTER — Other Ambulatory Visit (INDEPENDENT_AMBULATORY_CARE_PROVIDER_SITE_OTHER): Payer: Medicare Other

## 2014-08-06 ENCOUNTER — Encounter: Payer: Self-pay | Admitting: *Deleted

## 2014-08-06 LAB — MAGNESIUM: MAGNESIUM: 0.6 mg/dL — AB (ref 1.5–2.5)

## 2014-08-06 NOTE — Progress Notes (Signed)
Agree with recommendation to take MgO as prescribed (4 times daily).  I am hesitant to increase the dose any further at this point because of her chronic renal insufficiency and the potential to cause diarrhea.  A follow-up magnesium level can be drawn at her next follow-up appointment.

## 2014-08-06 NOTE — Progress Notes (Signed)
Received note from the Lab reporting pt's Magnesium level of 0.6 from 08/05/14. Pt was called and she states she has been taking her Mag-Ox 400 mg three times a day. The Rx is for Mag-Ox 400 mg 4 times a day.   Pt was asked to start taking this med 4 times each day.  She voices understanding.

## 2014-08-06 NOTE — Addendum Note (Signed)
Addended by: Truddie Crumble on: 08/06/2014 04:57 PM   Modules accepted: Orders

## 2014-08-19 ENCOUNTER — Other Ambulatory Visit: Payer: Self-pay | Admitting: *Deleted

## 2014-08-19 MED ORDER — SODIUM BICARBONATE 650 MG PO TABS: 650.0000 mg | ORAL_TABLET | Freq: Two times a day (BID) | ORAL | Status: DC

## 2014-08-23 DIAGNOSIS — J9620 Acute and chronic respiratory failure, unspecified whether with hypoxia or hypercapnia: Secondary | ICD-10-CM | POA: Diagnosis not present

## 2014-09-16 ENCOUNTER — Other Ambulatory Visit: Payer: Self-pay | Admitting: *Deleted

## 2014-09-17 ENCOUNTER — Inpatient Hospital Stay (HOSPITAL_COMMUNITY)
Admission: EM | Admit: 2014-09-17 | Discharge: 2014-09-23 | DRG: 871 | Disposition: A | Discharge status: Home / Self Care | Payer: Medicare Other | Attending: Internal Medicine | Admitting: Internal Medicine

## 2014-09-17 ENCOUNTER — Emergency Department (HOSPITAL_COMMUNITY): Payer: Medicare Other

## 2014-09-17 ENCOUNTER — Encounter (HOSPITAL_COMMUNITY): Payer: Self-pay | Admitting: Emergency Medicine

## 2014-09-17 DIAGNOSIS — E878 Other disorders of electrolyte and fluid balance, not elsewhere classified: Secondary | ICD-10-CM | POA: Diagnosis present

## 2014-09-17 DIAGNOSIS — D631 Anemia in chronic kidney disease: Secondary | ICD-10-CM | POA: Diagnosis present

## 2014-09-17 DIAGNOSIS — D638 Anemia in other chronic diseases classified elsewhere: Secondary | ICD-10-CM | POA: Diagnosis present

## 2014-09-17 DIAGNOSIS — D649 Anemia, unspecified: Secondary | ICD-10-CM

## 2014-09-17 DIAGNOSIS — E876 Hypokalemia: Secondary | ICD-10-CM

## 2014-09-17 DIAGNOSIS — Z888 Allergy status to other drugs, medicaments and biological substances status: Secondary | ICD-10-CM | POA: Diagnosis not present

## 2014-09-17 DIAGNOSIS — N170 Acute kidney failure with tubular necrosis: Secondary | ICD-10-CM | POA: Diagnosis not present

## 2014-09-17 DIAGNOSIS — F1290 Cannabis use, unspecified, uncomplicated: Secondary | ICD-10-CM | POA: Diagnosis not present

## 2014-09-17 DIAGNOSIS — E874 Mixed disorder of acid-base balance: Secondary | ICD-10-CM | POA: Diagnosis present

## 2014-09-17 DIAGNOSIS — I129 Hypertensive chronic kidney disease with stage 1 through stage 4 chronic kidney disease, or unspecified chronic kidney disease: Secondary | ICD-10-CM | POA: Diagnosis present

## 2014-09-17 DIAGNOSIS — J9600 Acute respiratory failure, unspecified whether with hypoxia or hypercapnia: Secondary | ICD-10-CM | POA: Insufficient documentation

## 2014-09-17 DIAGNOSIS — N183 Chronic kidney disease, stage 3 (moderate): Secondary | ICD-10-CM | POA: Diagnosis not present

## 2014-09-17 DIAGNOSIS — N186 End stage renal disease: Secondary | ICD-10-CM

## 2014-09-17 DIAGNOSIS — M329 Systemic lupus erythematosus, unspecified: Secondary | ICD-10-CM | POA: Diagnosis not present

## 2014-09-17 DIAGNOSIS — D696 Thrombocytopenia, unspecified: Secondary | ICD-10-CM | POA: Diagnosis present

## 2014-09-17 DIAGNOSIS — K219 Gastro-esophageal reflux disease without esophagitis: Secondary | ICD-10-CM | POA: Diagnosis present

## 2014-09-17 DIAGNOSIS — Z681 Body mass index (BMI) 19 or less, adult: Secondary | ICD-10-CM | POA: Diagnosis not present

## 2014-09-17 DIAGNOSIS — R197 Diarrhea, unspecified: Secondary | ICD-10-CM | POA: Diagnosis present

## 2014-09-17 DIAGNOSIS — Z9119 Patient's noncompliance with other medical treatment and regimen: Secondary | ICD-10-CM | POA: Diagnosis not present

## 2014-09-17 DIAGNOSIS — Y95 Nosocomial condition: Secondary | ICD-10-CM | POA: Diagnosis present

## 2014-09-17 DIAGNOSIS — F1010 Alcohol abuse, uncomplicated: Secondary | ICD-10-CM

## 2014-09-17 DIAGNOSIS — D72829 Elevated white blood cell count, unspecified: Secondary | ICD-10-CM | POA: Diagnosis present

## 2014-09-17 DIAGNOSIS — E871 Hypo-osmolality and hyponatremia: Secondary | ICD-10-CM | POA: Diagnosis not present

## 2014-09-17 DIAGNOSIS — N051 Unspecified nephritic syndrome with focal and segmental glomerular lesions: Secondary | ICD-10-CM | POA: Diagnosis present

## 2014-09-17 DIAGNOSIS — F32A Depression, unspecified: Secondary | ICD-10-CM

## 2014-09-17 DIAGNOSIS — F329 Major depressive disorder, single episode, unspecified: Secondary | ICD-10-CM | POA: Diagnosis present

## 2014-09-17 DIAGNOSIS — G40909 Epilepsy, unspecified, not intractable, without status epilepticus: Secondary | ICD-10-CM

## 2014-09-17 DIAGNOSIS — R0602 Shortness of breath: Secondary | ICD-10-CM

## 2014-09-17 DIAGNOSIS — K769 Liver disease, unspecified: Secondary | ICD-10-CM | POA: Diagnosis present

## 2014-09-17 DIAGNOSIS — I5042 Chronic combined systolic (congestive) and diastolic (congestive) heart failure: Secondary | ICD-10-CM | POA: Diagnosis present

## 2014-09-17 DIAGNOSIS — R7989 Other specified abnormal findings of blood chemistry: Secondary | ICD-10-CM | POA: Diagnosis present

## 2014-09-17 DIAGNOSIS — E872 Acidosis: Secondary | ICD-10-CM | POA: Diagnosis not present

## 2014-09-17 DIAGNOSIS — F1020 Alcohol dependence, uncomplicated: Secondary | ICD-10-CM

## 2014-09-17 DIAGNOSIS — M1990 Unspecified osteoarthritis, unspecified site: Secondary | ICD-10-CM | POA: Diagnosis present

## 2014-09-17 DIAGNOSIS — Z9981 Dependence on supplemental oxygen: Secondary | ICD-10-CM | POA: Diagnosis not present

## 2014-09-17 DIAGNOSIS — A419 Sepsis, unspecified organism: Principal | ICD-10-CM | POA: Diagnosis present

## 2014-09-17 DIAGNOSIS — J9601 Acute respiratory failure with hypoxia: Secondary | ICD-10-CM | POA: Diagnosis not present

## 2014-09-17 DIAGNOSIS — E8729 Other acidosis: Secondary | ICD-10-CM | POA: Diagnosis present

## 2014-09-17 DIAGNOSIS — J189 Pneumonia, unspecified organism: Secondary | ICD-10-CM | POA: Diagnosis present

## 2014-09-17 DIAGNOSIS — E861 Hypovolemia: Secondary | ICD-10-CM | POA: Diagnosis present

## 2014-09-17 DIAGNOSIS — N179 Acute kidney failure, unspecified: Secondary | ICD-10-CM | POA: Diagnosis not present

## 2014-09-17 DIAGNOSIS — N184 Chronic kidney disease, stage 4 (severe): Secondary | ICD-10-CM | POA: Diagnosis present

## 2014-09-17 DIAGNOSIS — J9620 Acute and chronic respiratory failure, unspecified whether with hypoxia or hypercapnia: Secondary | ICD-10-CM | POA: Diagnosis not present

## 2014-09-17 DIAGNOSIS — R778 Other specified abnormalities of plasma proteins: Secondary | ICD-10-CM | POA: Diagnosis present

## 2014-09-17 DIAGNOSIS — E43 Unspecified severe protein-calorie malnutrition: Secondary | ICD-10-CM | POA: Diagnosis not present

## 2014-09-17 DIAGNOSIS — R791 Abnormal coagulation profile: Secondary | ICD-10-CM | POA: Diagnosis present

## 2014-09-17 DIAGNOSIS — Z87891 Personal history of nicotine dependence: Secondary | ICD-10-CM

## 2014-09-17 DIAGNOSIS — I313 Pericardial effusion (noninflammatory): Secondary | ICD-10-CM | POA: Diagnosis not present

## 2014-09-17 DIAGNOSIS — Z992 Dependence on renal dialysis: Secondary | ICD-10-CM

## 2014-09-17 DIAGNOSIS — R079 Chest pain, unspecified: Secondary | ICD-10-CM | POA: Diagnosis present

## 2014-09-17 DIAGNOSIS — R0789 Other chest pain: Secondary | ICD-10-CM | POA: Diagnosis not present

## 2014-09-17 DIAGNOSIS — E781 Pure hyperglyceridemia: Secondary | ICD-10-CM | POA: Diagnosis present

## 2014-09-17 DIAGNOSIS — E873 Alkalosis: Secondary | ICD-10-CM | POA: Diagnosis not present

## 2014-09-17 HISTORY — DX: Reserved for inherently not codable concepts without codable children: IMO0001

## 2014-09-17 LAB — URINALYSIS, ROUTINE W REFLEX MICROSCOPIC
GLUCOSE, UA: NEGATIVE mg/dL
Ketones, ur: NEGATIVE mg/dL
Leukocytes, UA: NEGATIVE
Nitrite: NEGATIVE
PH: 5.5 (ref 5.0–8.0)
Specific Gravity, Urine: 1.02 (ref 1.005–1.030)
Urobilinogen, UA: 0.2 mg/dL (ref 0.0–1.0)

## 2014-09-17 LAB — CBC WITH DIFFERENTIAL/PLATELET
BASOS PCT: 0 % (ref 0–1)
Basophils Absolute: 0 10*3/uL (ref 0.0–0.1)
Eosinophils Absolute: 0 10*3/uL (ref 0.0–0.7)
Eosinophils Relative: 0 % (ref 0–5)
HEMATOCRIT: 23 % — AB (ref 36.0–46.0)
Hemoglobin: 7.3 g/dL — ABNORMAL LOW (ref 12.0–15.0)
LYMPHS ABS: 2 10*3/uL (ref 0.7–4.0)
Lymphocytes Relative: 7 % — ABNORMAL LOW (ref 12–46)
MCH: 30.3 pg (ref 26.0–34.0)
MCHC: 31.7 g/dL (ref 30.0–36.0)
MCV: 95.4 fL (ref 78.0–100.0)
MONOS PCT: 9 % (ref 3–12)
Monocytes Absolute: 2.5 10*3/uL — ABNORMAL HIGH (ref 0.1–1.0)
NEUTROS ABS: 23.5 10*3/uL — AB (ref 1.7–7.7)
Neutrophils Relative %: 84 % — ABNORMAL HIGH (ref 43–77)
Platelets: 87 10*3/uL — ABNORMAL LOW (ref 150–400)
RBC: 2.41 MIL/uL — ABNORMAL LOW (ref 3.87–5.11)
RDW: 15.1 % (ref 11.5–15.5)
WBC: 28 10*3/uL — AB (ref 4.0–10.5)

## 2014-09-17 LAB — BRAIN NATRIURETIC PEPTIDE: B NATRIURETIC PEPTIDE 5: 2401.2 pg/mL — AB (ref 0.0–100.0)

## 2014-09-17 LAB — PHOSPHORUS: Phosphorus: 2.5 mg/dL (ref 2.3–4.6)

## 2014-09-17 LAB — RAPID URINE DRUG SCREEN, HOSP PERFORMED
Amphetamines: NOT DETECTED
Barbiturates: NOT DETECTED
Benzodiazepines: NOT DETECTED
Cocaine: NOT DETECTED
OPIATES: NOT DETECTED
Tetrahydrocannabinol: NOT DETECTED

## 2014-09-17 LAB — MAGNESIUM: Magnesium: 0.6 mg/dL — CL (ref 1.5–2.5)

## 2014-09-17 LAB — URINE MICROSCOPIC-ADD ON

## 2014-09-17 LAB — COMPREHENSIVE METABOLIC PANEL
ALBUMIN: 1.1 g/dL — AB (ref 3.5–5.2)
ALK PHOS: 234 U/L — AB (ref 39–117)
ALT: 8 U/L (ref 0–35)
AST: 44 U/L — ABNORMAL HIGH (ref 0–37)
Anion gap: 17 — ABNORMAL HIGH (ref 5–15)
BILIRUBIN TOTAL: 0.5 mg/dL (ref 0.3–1.2)
BUN: 23 mg/dL (ref 6–23)
CHLORIDE: 109 mmol/L (ref 96–112)
CO2: 6 mmol/L — CL (ref 19–32)
Calcium: <4 mg/dL — CL (ref 8.4–10.5)
Creatinine, Ser: 4.14 mg/dL — ABNORMAL HIGH (ref 0.50–1.10)
GFR calc Af Amer: 13 mL/min — ABNORMAL LOW (ref >90)
GFR calc non Af Amer: 11 mL/min — ABNORMAL LOW (ref >90)
GLUCOSE: 174 mg/dL — AB (ref 70–99)
POTASSIUM: 2.5 mmol/L — AB (ref 3.5–5.1)
Sodium: 132 mmol/L — ABNORMAL LOW (ref 135–145)
Total Protein: 7.5 g/dL (ref 6.0–8.3)

## 2014-09-17 LAB — CREATININE, URINE, RANDOM: Creatinine, Urine: 188.23 mg/dL

## 2014-09-17 LAB — I-STAT ARTERIAL BLOOD GAS, ED
ACID-BASE DEFICIT: 18 mmol/L — AB (ref 0.0–2.0)
BICARBONATE: 6 meq/L — AB (ref 20.0–24.0)
O2 Saturation: 100 %
PCO2 ART: 12.8 mmHg — AB (ref 35.0–45.0)
TCO2: 6 mmol/L (ref 0–100)
pH, Arterial: 7.28 — ABNORMAL LOW (ref 7.350–7.450)
pO2, Arterial: 181 mmHg — ABNORMAL HIGH (ref 80.0–100.0)

## 2014-09-17 LAB — LACTIC ACID, PLASMA: Lactic Acid, Venous: 5.6 mmol/L (ref 0.5–2.0)

## 2014-09-17 LAB — PROTIME-INR
INR: 3.92 — AB (ref 0.00–1.49)
Prothrombin Time: 38.7 seconds — ABNORMAL HIGH (ref 11.6–15.2)

## 2014-09-17 LAB — I-STAT TROPONIN, ED: Troponin i, poc: 0.08 ng/mL (ref 0.00–0.08)

## 2014-09-17 LAB — LIPASE, BLOOD: Lipase: 13 U/L (ref 11–59)

## 2014-09-17 LAB — ETHANOL: Alcohol, Ethyl (B): 105 mg/dL — ABNORMAL HIGH (ref 0–9)

## 2014-09-17 MED ORDER — PANCRELIPASE (LIP-PROT-AMYL) 12000-38000 UNITS PO CPEP
12000.0000 [IU] | ORAL_CAPSULE | Freq: Three times a day (TID) | ORAL | Status: DC
  Administered 2014-09-18 – 2014-09-23 (×17): 12000 [IU] via ORAL
  Filled 2014-09-17 (×17): qty 1

## 2014-09-17 MED ORDER — SODIUM CHLORIDE 0.9 % IV SOLN
1.0000 g | Freq: Once | INTRAVENOUS | Status: AC
  Administered 2014-09-17: 1 g via INTRAVENOUS
  Filled 2014-09-17: qty 10

## 2014-09-17 MED ORDER — POTASSIUM CHLORIDE 10 MEQ/100ML IV SOLN
10.0000 meq | INTRAVENOUS | Status: AC
  Administered 2014-09-17 (×3): 10 meq via INTRAVENOUS
  Filled 2014-09-17 (×3): qty 100

## 2014-09-17 MED ORDER — MAGNESIUM SULFATE IN D5W 10-5 MG/ML-% IV SOLN
1.0000 g | Freq: Once | INTRAVENOUS | Status: AC
  Administered 2014-09-17: 1 g via INTRAVENOUS
  Filled 2014-09-17: qty 100

## 2014-09-17 MED ORDER — FOLIC ACID 1 MG PO TABS
1.0000 mg | ORAL_TABLET | Freq: Every day | ORAL | Status: DC
Start: 2014-09-18 — End: 2014-09-23
  Administered 2014-09-18 – 2014-09-23 (×6): 1 mg via ORAL
  Filled 2014-09-17 (×6): qty 1

## 2014-09-17 MED ORDER — HEPARIN SODIUM (PORCINE) 5000 UNIT/ML IJ SOLN: 5000.0000 [IU] | Freq: Three times a day (TID) | INTRAMUSCULAR | Status: DC

## 2014-09-17 MED ORDER — POTASSIUM CHLORIDE CRYS ER 20 MEQ PO TBCR
20.0000 meq | EXTENDED_RELEASE_TABLET | Freq: Once | ORAL | Status: AC
  Administered 2014-09-17: 20 meq via ORAL
  Filled 2014-09-17: qty 1

## 2014-09-17 MED ORDER — THIAMINE HCL 100 MG/ML IJ SOLN
Freq: Once | INTRAVENOUS | Status: AC
  Administered 2014-09-18: 02:00:00 via INTRAVENOUS
  Filled 2014-09-17: qty 1000

## 2014-09-17 MED ORDER — VITAMIN B-1 100 MG PO TABS
100.0000 mg | ORAL_TABLET | Freq: Every day | ORAL | Status: DC
Start: 2014-09-18 — End: 2014-09-23
  Administered 2014-09-18 – 2014-09-23 (×6): 100 mg via ORAL
  Filled 2014-09-17 (×6): qty 1

## 2014-09-17 MED ORDER — ADULT MULTIVITAMIN W/MINERALS CH
1.0000 | ORAL_TABLET | Freq: Every day | ORAL | Status: DC
Start: 2014-09-18 — End: 2014-09-23
  Administered 2014-09-18 – 2014-09-23 (×6): 1 via ORAL
  Filled 2014-09-17 (×6): qty 1

## 2014-09-17 MED ORDER — STERILE WATER FOR INJECTION IV SOLN
INTRAVENOUS | Status: DC
  Administered 2014-09-17 – 2014-09-20 (×4): via INTRAVENOUS
  Filled 2014-09-17 (×8): qty 850

## 2014-09-17 MED ORDER — SODIUM CHLORIDE 0.9 % IV SOLN
Freq: Once | INTRAVENOUS | Status: AC
  Administered 2014-09-17: 20:00:00 via INTRAVENOUS

## 2014-09-17 MED ORDER — THIAMINE HCL 100 MG/ML IJ SOLN
100.0000 mg | Freq: Every day | INTRAMUSCULAR | Status: DC
  Filled 2014-09-17: qty 2

## 2014-09-17 MED ORDER — LORAZEPAM 2 MG/ML IJ SOLN
1.0000 mg | Freq: Four times a day (QID) | INTRAMUSCULAR | Status: AC | PRN
  Administered 2014-09-19: 1 mg via INTRAVENOUS
  Filled 2014-09-17: qty 1

## 2014-09-17 MED ORDER — CALCITRIOL 0.25 MCG PO CAPS
0.2500 ug | ORAL_CAPSULE | Freq: Every day | ORAL | Status: DC
  Administered 2014-09-18 – 2014-09-23 (×6): 0.25 ug via ORAL
  Filled 2014-09-17 (×6): qty 1

## 2014-09-17 MED ORDER — LORAZEPAM 1 MG PO TABS: 1.0000 mg | ORAL_TABLET | Freq: Four times a day (QID) | ORAL | Status: AC | PRN

## 2014-09-17 MED ORDER — SODIUM CHLORIDE 0.9 % IJ SOLN
3.0000 mL | Freq: Two times a day (BID) | INTRAMUSCULAR | Status: DC
  Administered 2014-09-18 – 2014-09-23 (×7): 3 mL via INTRAVENOUS

## 2014-09-17 MED ORDER — PANTOPRAZOLE SODIUM 40 MG PO TBEC: 40.0000 mg | DELAYED_RELEASE_TABLET | Freq: Every day | ORAL | Status: DC

## 2014-09-17 MED ORDER — ALBUTEROL SULFATE HFA 108 (90 BASE) MCG/ACT IN AERS: 1.0000 | INHALATION_SPRAY | Freq: Four times a day (QID) | RESPIRATORY_TRACT | Status: DC | PRN

## 2014-09-17 NOTE — ED Notes (Signed)
Charge Nurse informed appt time @2020 .

## 2014-09-17 NOTE — ED Notes (Signed)
Pt c/o diarrhea, and dizziness x 4 days. 2 days ago pt became sob. Pt with labored breathing in triage. Pt used to wear 4 L oxygen but her doctors took her off 6 mo ago. Denies pain.

## 2014-09-17 NOTE — ED Notes (Signed)
MD Aline Brochure to bedside to attempt IV.

## 2014-09-17 NOTE — ED Notes (Signed)
IV team at bedside 

## 2014-09-17 NOTE — ED Notes (Signed)
Charge Nurse reinformed about new appt time request by ED nurse appt @ 2040.

## 2014-09-17 NOTE — ED Notes (Signed)
PHelbotomy and RT at the bedside.

## 2014-09-17 NOTE — Progress Notes (Signed)
Patient left for admitting room directly after RT obtained ABG. RT contacted 3W RN Janett Billow and reported critical values. PH 7.28  PCO2 12.8   PO2 181  HCO3 6.0  sO2 100%

## 2014-09-17 NOTE — H&P (Signed)
Date: 09/17/2014               Patient Name:  [name] [name] MRN: [id number]  DOB: 29-Dec-1955 Age / Sex: 59 y.o., female   PCP: Otho Bellows, MD         Medical Service: Internal Medicine Teaching Service         Attending Physician: Dr. Aldine Contes, MD    First Contact: Dr. Karle Starch Moding Pager: [phone]  Second Contact: Dr. 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 These symptoms started together three days ago. The shortness of breath is worst when she is moving but also present at rest. She has been using her home oxygen at her normal rate of 4L. She uses 3 pillows to sleep at night but does not wake up short of breath. She denies recent illness, sick contacts or cough.  The chest pain is sharp and located in the middle of her chest. It has been continuously present throughout the past 3 days. She denies radiation of the pain or associated nausea, vomiting or diaphoresis. There is no position that relieves or exacerbates the pain. She has also noted 5 episodes of diarrhea, which occurred yesterday, and some periodic chills. She has been taking her home medications as prescribed.   She states that her last alcoholic beverage was 5 days ago; she denies ingestion of any toxic alcohol, aspirin or tylenol.  Meds: Current Outpatient Prescriptions  Medication Sig Dispense Refill  . albuterol (PROAIR HFA) 108 (90 BASE) MCG/ACT inhaler Inhale 1-2 puffs into the lungs every 6  (six) hours as needed for wheezing or shortness of breath. 1 Inhaler 1  . amLODipine (NORVASC) 5 MG tablet Take 1 tablet (5 mg total) by mouth daily. 90 tablet 3  . calcitRIOL (ROCALTROL) 0.25 MCG capsule Take 0.25 mcg by mouth daily.    . calcium carbonate (TUMS) 500 MG chewable tablet Chew 6 tablets (1,200 mg of elemental calcium total) by mouth 3 (three) times daily. For bone health 90 tablet 3  . cetirizine (ZYRTEC) 10 MG tablet Take 1 tablet (10 mg total) by mouth daily as needed for allergies. 30 tablet 6  . colchicine 0.6 MG tablet Take 0.5 tablets (0.3 mg total) by mouth daily. 40 tablet 0  . feeding supplement, ENSURE COMPLETE, (ENSURE COMPLETE) LIQD Take 237 mLs by mouth 3 (three) times daily between meals. 90 Bottle 1  . FLUoxetine (PROZAC) 10 MG capsule Take 1 capsule (10 mg total) by mouth daily. For depression 90 capsule 3  . folic acid (FOLVITE) 1 MG tablet Take 1 tablet (1 mg total) by mouth daily. For folic acid replacement 30 tablet 6  . furosemide (LASIX) 40 MG tablet Take 0.5 tablets (20 mg total) by mouth daily. 45 tablet 4  . gabapentin (NEURONTIN) 300 MG capsule Take 2 capsules (600 mg total) by mouth 3 (three) times daily. For anxiety/pain control 540 capsule 1  . guaiFENesin-dextromethorphan (ROBITUSSIN DM) 100-10 MG/5ML  syrup Take 5 mLs by mouth every 4 (four) hours as needed for cough. 118 mL 0  . lipase/protease/amylase (CREON) 12000 UNITS CPEP capsule Take 1 capsule (12,000 Units total) by mouth 3 (three) times daily before meals. 270 capsule 3  . lisinopril (PRINIVIL,ZESTRIL) 10 MG tablet Take 1 tablet (10 mg total) by mouth daily. 90 tablet 4  . magnesium oxide (MAG-OX) 400 MG tablet Take 400 mg by mouth 4 (four) times daily.    . metoprolol tartrate (LOPRESSOR) 25 MG tablet Take 12.5 mg by mouth 2 (two) times daily.    . Multiple Vitamin (MULTIVITAMIN WITH MINERALS) TABS tablet Take 1 tablet by mouth daily. For vitamin replacement 90 tablet 4  . omeprazole (PRILOSEC)  40 MG capsule Take 1 capsule (40 mg total) by mouth daily. 90 capsule 3  . sodium bicarbonate 650 MG tablet Take 1 tablet (650 mg total) by mouth 2 (two) times daily. 60 tablet 3  . thiamine (VITAMIN B-1) 100 MG tablet Take 1 tablet (100 mg total) by mouth daily. For low thiamine 90 tablet 3  . vitamin B-12 (CYANOCOBALAMIN) 250 MCG tablet Take 1 tablet (250 mcg total) by mouth every evening. 90 tablet 3  . Vitamin D, Ergocalciferol, (DRISDOL) 50000 UNITS CAPS capsule Take 1 capsule (50,000 Units total) by mouth every 7 (seven) days. Thursdays: For bone health     Allergies: Allergies as of 09/17/2014 - Review Complete 09/17/2014  Allergen Reaction Noted  . Amitriptyline hcl Swelling   . Doxycycline hyclate Itching 11/10/2010   Past Medical History  Diagnosis Date  . Anemia, B12 deficiency   . History of acute pancreatitis   . Right knee pain     No recent imaging on chart  . 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 Dr. Mancel Bale, [facility]  . Hypertriglyceridemia   . GERD (gastroesophageal reflux disease)   . Subdural hematoma 02/2008    Likely 2/2 trauma from seizure from EtOH withdrawal, chronic in nature, sees Dr. Jerene Bears. Most recent CT head 10/2009 showing stable but persistent hematoma without mass effect.  . History of seizure disorder     Likely 2/2 alcohol abuse  . Hypocalcemia   . Hypomagnesemia   . Failure to thrive in childhood     Unclear etiology  . HTN (hypertension)   . Thrombocytopenia   . Hepatomegaly     On exam  . Joint pain   . Alcohol abuse   . Vitamin D deficiency   . Pancreatitis   . Insomnia   . Hyperlipidemia   . Pernicious anemia   . Macrocytic anemia   . 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 Depression   . Fx humeral neck 04/17/2011    Transverse fracture- minimally displaced- managed as outpatient   . ABNORMAL PAP SMEAR, LGSIL 07/23/2008    Annotation: HPV positive CIN I Dr.  Mancel Bale, [facility] Qualifier: Diagnosis of  By: Oretha Ellis    . Pneumonia 05/20/2012  . Arthritis     "shoulders" (08/15/2013)  . 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  Marland Kitchen Chronic diastolic CHF (congestive heart failure)   . Hypomagnesemia   . Seizures     "don't know when/why I had them; daughter was always there w/me"  . On home oxygen therapy     "3L; mostly at night" (06/19/2014)   Past  Surgical History  Procedure Laterality Date  . Cesarean section  1983  . Esophagogastroduodenoscopy  07/11/2011    Procedure: ESOPHAGOGASTRODUODENOSCOPY (EGD);  Surgeon: Beryle Beams, MD;  Location: [facility];  Service: Endoscopy;  Laterality: N/A;  . Colonoscopy  07/11/2011    Procedure: COLONOSCOPY;  Surgeon: Beryle Beams, MD;  Location: [facility] ENDOSCOPY;  Service: Endoscopy;  Laterality: N/A;  . Eye surgery Left     "trauma"  . Right colectomy  08/28/2011  . Esophagogastroduodenoscopy N/A 12/01/2012    Procedure: ESOPHAGOGASTRODUODENOSCOPY (EGD);  Surgeon: Irene Shipper, MD;  Location: [facility];  Service: Endoscopy;  Laterality: N/A;  . Colonoscopy with esophagogastroduodenoscopy (egd) Left 08/21/2013    Procedure: COLONOSCOPY WITH ESOPHAGOGASTRODUODENOSCOPY (EGD);  Surgeon: Beryle Beams, MD;  Location: [facility] ENDOSCOPY;  Service: Endoscopy;  Laterality: Left;  . Subxyphoid pericardial window N/A 12/04/2013    Procedure: SUBXYPHOID PERICARDIAL WINDOW WITH TEE;  Surgeon: Ivin Poot, MD;  Location: [place];  Service: Thoracic;  Laterality: N/A;  . Intraoperative transesophageal echocardiogram N/A 12/04/2013    Procedure: INTRAOPERATIVE TRANSESOPHAGEAL ECHOCARDIOGRAM;  Surgeon: Ivin Poot, MD;  Location: [place];  Service: Open Heart Surgery;  Laterality: N/A;   Family History  Problem Relation Age of Onset  . Cancer Mother     Died from stomach cancer and "flesh eating rash  . Heart failure Father     Died in 35s from an MI  .  Alcohol abuse Sister     Twin sister drinks a lot, as did both her parents and brothers  . Stroke Brother     Has 7 brothers, 1 with CVA  . Lupus Mother    History   Social History  . Marital Status: Divorced    Spouse Name: N/A  . Number of Children: N/A  . Years of Education: N/A   Occupational History  . Not on file.   Social History Main Topics  . Smoking status: Former Smoker -- 0.50 packs/day for 40 years    Types: Cigarettes    Quit date: 09/20/2010  . Smokeless tobacco: Never Used  . Alcohol Use: Yes     Comment: 06/19/2014 "graduated from [employer] (for alcohol abuse) in October 2015"   . Drug Use: Yes    Special: Marijuana, Cocaine     Comment: 06/19/2014 "last drug use was in 2012"  . Sexual Activity: Not Currently   Other Topics Concern  . Not on file   Social History Narrative   Lives with her significant other and 2 grandchildren. 1 child   Has 7 brothers and 4 sisters, 1 twin sister.   Unemployed, worked in [employer].    Abuses alcohol-drinks 1 glass of wine daily    No drug use. Former cigarette use quit 1.5 years ago.     11 th grade education            Review of Systems: See HPI  Physical Exam: Blood pressure 112/71, pulse 99, temperature 97.7 F (36.5 C), temperature source Oral, resp. rate 28, height 5\' 1"  (1.549 m), weight 81 lb 14.4 oz (37.15 kg), SpO2 96 %. on 4 L [place] O2 Appearance: thin woman in some distress, sitting up in bed, appears weak HEENT: AT/[place], PERRL, EOMi, MMM Heart: RRR, normal S1S2, no murmur, no friction rub, patient lay flat without any increase in pain or respiratory decline, slight JVD Lungs: CTAB, no wheezing, increased work of breathing with extensive accessory muscle use Abdomen: bowel sounds present,  soft, nontender Musculoskeletal: normal range of motion Extremities: very trace edema bilaterally Neurologic: A&Ox3, grossly intact Psychiatric: States mood has been up and down, denies SI/HI Skin: no rashes  or lesions, warm and dry  Lab results: Basic Metabolic Panel:  Recent Labs  09/17/14 1748  NA 132*  K 2.5*  CL 109  CO2 6*  GLUCOSE 174*  BUN 23  CREATININE 4.14*  CALCIUM <4.0*  MG 0.6*  PHOS 2.5   Liver Function Tests:  Recent Labs  09/17/14 1748  AST 44*  ALT 8  ALKPHOS 234*  BILITOT 0.5  PROT 7.5  ALBUMIN 1.1*    Recent Labs  09/17/14 1748  LIPASE 13   CBC:  Recent Labs  09/17/14 1748  WBC 28.0*  NEUTROABS 23.5*  HGB 7.3*  HCT 23.0*  MCV 95.4  PLT 87*   ABG: pH 7.280 / pCO2 12.8 / pO2 181.0 / bicarbonate 6.0  Lactic Acid: 5.6  INR: 3.92  Urine Drug Screen: Drugs of Abuse     Component Value Date/Time   LABOPIA NONE DETECTED 09/17/2014 1849   LABOPIA NEG 09/18/2011 0936   COCAINSCRNUR NONE DETECTED 09/17/2014 1849   COCAINSCRNUR NEG 09/18/2011 0936   LABBENZ NONE DETECTED 09/17/2014 1849   LABBENZ NEG 09/18/2011 0936   LABBENZ NEG 04/10/2011 1130   AMPHETMU NONE DETECTED 09/17/2014 1849   AMPHETMU NEG 09/18/2011 0936   AMPHETMU NEG 04/10/2011 1130   THCU NONE DETECTED 09/17/2014 1849   THCU NEG 09/18/2011 0936   LABBARB NONE DETECTED 09/17/2014 1849   LABBARB NEG 09/18/2011 0936    Alcohol Level:  Recent Labs  09/17/14 1748  ETH 105*   Urinalysis:  Recent Labs  09/17/14 1849  COLORURINE AMBER*  LABSPEC 1.020  PHURINE 5.5  GLUCOSEU NEGATIVE  HGBUR MODERATE*  BILIRUBINUR SMALL*  KETONESUR NEGATIVE  PROTEINUR >300*  UROBILINOGEN 0.2  NITRITE NEGATIVE  LEUKOCYTESUR NEGATIVE   Imaging results:  Dg Chest Port 1 View  09/17/2014   CLINICAL DATA:  Shortness of breath. Diarrhea with dizziness for 4 days. History of hypertension, alcohol abuse, tuberculosis, pneumonia and congestive heart failure.  EXAM: PORTABLE CHEST - 1 VIEW  COMPARISON:  Radiographs 12/29/2013 and 12/17/2013.  FINDINGS: 1758 hour. The heart size has decreased and is now normal. The mediastinal contours are normal. The lungs are clear. There is no pleural  effusion or pneumothorax. Old healed fractures of both proximal humeri are noted. There is no evidence of acute fracture. Telemetry leads overlie the chest.  IMPRESSION: No active cardiopulmonary process.   Electronically Signed   By: Richardean Sale M.D.   On: 09/17/2014 17:23    Other results: EKG: Rate 127, poor test with artifact and baseline wander, long QTc. Repeat pending  Assessment & Plan by Problem: Principal Problem:   SOB (shortness of breath) Active Problems:   Normochromic normocytic anemia   Alcohol abuse   Depression   Hypertension, renal disease   GERD   Seizure disorder   Severe protein-calorie malnutrition   Hypoalbuminemia   Hypomagnesemia   Alcohol dependence   FSGS (focal segmental glomerulosclerosis)   Increased anion gap metabolic acidosis   Systolic and diastolic CHF, chronic   Anemia of chronic disease   Prolonged Q-T interval on ECG   Lupus (systemic lupus erythematosus)   Acute respiratory failure   AKI (acute kidney injury)   Leukocytosis   Chest pain   Hyponatremia   Hypocalcemia   Hypokalemia   Thrombocytopenia  [name] [name] is a 59 yo woman  who presented in acute respiratory failure with chest pain and has been found to have an anion gap metabolic acidosis with respiratory alkalosis.   Acute Respiratory Failure with Chest Pain: Rule out ACS, recurrent pericarditis, but her ARF is likely due to her mixed acid-base disturbance. Patient's exam not consistent with pericarditis. - Echocardiogram - Trending troponins - Repeat EKG  Anion Gap Metabolic Acidosis with Respiratory Alkalosis: Delta-delta also shows non-anion gap acidosis. - Lactic acid q3 hours - Sodium bicarbonate in sterile water infusion 75 mL/hr  Electrolyte Abnormalities: Patient presented with hyponatremia, hypokalemia, hypomagnesemia and hypocalcemia. Patient's initial BMET showed a sodium of 132 and potassium 2.5. Patient could have Type I RTA.  - Plasma osmolality and urine  osmolality pending  - Potassium chloride 10 mEq x4 - Kdur 20 mg by mouth - 2 g magnesium sulfate - 2 g calcium gluconate - Calcitriol 0.25 mcg daily (home medication) - Ionized calcium pending - Repeat electrolytes at midnight  Acute on Chronic Stage IV Kidney Disease with FSGS and SLE: Patient's creatinine was 4.14 on admission; her baseline is 2-3. - Renal consult (to see patient in am) - Renal ultrasound - Fractional excretion of urea pending - Held offensive medications  Elevated INR: Possible causes are her low albumin, any underlying liver disease (though no mention of abnormalities on 2015 abdominal US), possibly a low vitamin K, underlying fat malabsorption; could also be related to her SLE. - PTT pending - GGT pending  Diarrhea: Appears to have stopped, but patient had 5 episodes yesterday. - C diff pending - GI pathogen panel pending - HIV antibody pending  Leukocytosis and Thrombocytopenia: Patient's initial WBC was 28.0 and platelets 87 (lowest ever). - Urine and blood cultures pending  Chronic Combined Heart Failure: 12/2013 echo showed 40-45% LVEF with LVH and grade 2 diastolic dysfunction. No evidence of fluid overload on exam.  Anemia of Chronic Renal Disease: Normocytic, normochromic anemia with baseline hemoglobin 7.5-8.0. Admission hemoglobin 7.3.  - Continue to trend  GERD: Patient is on omeprazole 40 mg daily at home. - Protonix 40 mg daily  History of Hypertension: Currently 112/71. - Holding norvasc, lasix, lopressor in setting of acute illness  Alcohol Abuse with History of Seizures: Ethanol 105 on admission today. Unclear whether seizures were due to withdrawal in past. - Banana bag - CIWA - Seizure precautions  Depression: Patient reports that she has not been taking her home anti-depressive medications. Her mood has been "up and down", but she denies SI/HI.  Decreased Functional Status: Currently living by herself, cooks, cleans. - PT and OT  evaluations  Prolonged QT:  - Avoid QT prolonging medications  Diet: Renal diet0-->NPO after midnight of renal US - Has severe protein calorie malnutrition  DVT Ppx: SCDs (INR is elevated)  Dispo: Disposition is deferred at this time, awaiting improvement of current medical problems. Anticipated discharge in approximately 2-3 day(s).   The patient does have a current PCP Otho Bellows, MD) and does need an [facility] hospital follow-up appointment after discharge.  The patient does not have transportation limitations that hinder transportation to clinic appointments.  Signed: Karlene Einstein, MD 09/17/2014, 10:42 PM

## 2014-09-17 NOTE — ED Provider Notes (Signed)
CSN: [id number]     Arrival date & time 09/17/14  1645 History   First MD Initiated Contact with Patient 09/17/14 1658     Chief Complaint  Patient presents with  . Shortness of Breath     (Consider location/radiation/quality/duration/timing/severity/associated sxs/prior Treatment) HPI Comments: [name] [name] is a 59 y.o. 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 She also states that she has had lightheadedness with standing for "a while" and "a few days" of 8/10 central sharp nonradiating intermittent chest pain worse with swallowing and breathing, and improved with drinking water. Additionally she reports 2 days of watery nonbloody diarrhea, stating that she has approximately 10 episodes per day. She denies any recent fevers, chills, cough, hemoptysis, leg swelling, travel, surgeries, mobilization, estrogen use, history of DVT/PE, abdominal pain, nausea, vomiting, constipation, melena, hematochezia, dysuria, hematuria, vaginal bleeding or discharge, numbness, tingling, weakness, rashes, headaches, vision changes, palpitations, PND, orthopnea, sick contacts, or antibiotic use. She endorses drinking 2 glasses of wine daily.  Of note, pt is a very poor historian. Medical records were reviewed to obtain some information regarding past medical history.  Patient is a 59 y.o. female presenting with shortness of breath. The history is provided by the patient and medical records. No language  interpreter was used.  Shortness of Breath Severity:  Moderate Onset quality:  Gradual Duration:  5 weeks Timing:  Constant Progression:  Worsening Chronicity:  Chronic Context: activity   Relieved by:  None tried Worsened by:  Activity Ineffective treatments:  None tried Associated symptoms: chest pain   Associated symptoms: no abdominal pain, no cough, no fever, no headaches, no rash, no vomiting and no wheezing   Risk factors: alcohol use   Risk factors: no hx of PE/DVT, no oral contraceptive use, no prolonged immobilization, no recent surgery and no tobacco use     Past Medical History  Diagnosis Date  . Anemia, B12 deficiency   . History of acute pancreatitis   . Right knee pain     No recent imaging on chart  . 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 Dr. Mancel Bale, [facility]  . Hypertriglyceridemia   . GERD (gastroesophageal reflux disease)   . Subdural hematoma 02/2008    Likely 2/2 trauma from seizure from EtOH withdrawal, chronic in nature, sees Dr. Jerene Bears. Most recent CT head 10/2009 showing stable but persistent hematoma without mass effect.  . History of seizure disorder     Likely 2/2 alcohol abuse  . Hypocalcemia   . Hypomagnesemia   . Failure to thrive in childhood     Unclear etiology  . HTN (hypertension)   . Thrombocytopenia   . Hepatomegaly     On exam  . Joint pain   . Alcohol abuse   . Vitamin D deficiency   . Pancreatitis   . Insomnia   . Hyperlipidemia   . Pernicious anemia   . Macrocytic anemia   . 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  Depression   . Fx humeral neck 04/17/2011    Transverse fracture- minimally displaced- managed as outpatient   . ABNORMAL PAP SMEAR, LGSIL 07/23/2008    Annotation: HPV positive CIN I Dr. Mancel Bale, [facility] Qualifier: Diagnosis of  By: Oretha Ellis    . Pneumonia 05/20/2012  . Arthritis     "shoulders" (08/15/2013)    . 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  Marland Kitchen Chronic diastolic CHF (congestive heart failure)   . Hypomagnesemia   . Seizures     "don't know when/why I had them; daughter was always there w/me"  . On home oxygen therapy     "3L; mostly at night" (06/19/2014)   Past Surgical History  Procedure Laterality Date  . Cesarean section  1983  . Esophagogastroduodenoscopy  07/11/2011    Procedure: ESOPHAGOGASTRODUODENOSCOPY (EGD);  Surgeon: Beryle Beams, MD;  Location: [facility];  Service: Endoscopy;  Laterality: N/A;  . Colonoscopy  07/11/2011    Procedure: COLONOSCOPY;  Surgeon: Beryle Beams, MD;  Location: [facility]OS[facility];  Service: Endoscopy;  Laterality: N/A;  . Eye surgery Left     "trauma"  . Right colectomy  08/28/2011  . Esophagogastroduodenoscopy N/A 12/01/2012    Procedure: ESOPHAGOGASTRODUODENOSCOPY (EGD);  Surgeon: Irene Shipper, MD;  Location: [facility];  Service: Endoscopy;  Laterality: N/A;  . Colonoscopy with esophagogastroduodenoscopy (egd) Left 08/21/2013    Procedure: COLONOSCOPY WITH ESOPHAGOGASTRODUODENOSCOPY (EGD);  Surgeon: Beryle Beams, MD;  Location: [facility] ENDOSCOPY;  Service: Endoscopy;  Laterality: Left;  . Subxyphoid pericardial window N/A 12/04/2013    Procedure: SUBXYPHOID PERICARDIAL WINDOW WITH TEE;  Surgeon: Ivin Poot, MD;  Location: [facility];  Service: Thoracic;  Laterality: N/A;  . Intraoperative transesophageal echocardiogram N/A 12/04/2013    Procedure: INTRAOPERATIVE TRANSESOPHAGEAL ECHOCARDIOGRAM;  Surgeon: Ivin Poot, MD;  Location: [facility];  Service: Open Heart Surgery;  Laterality: N/A;   Family History  Problem Relation Age of Onset  . Cancer Mother     Died from stomach cancer and "flesh eating rash  . Heart failure Father     Died in 66s from an MI  . Alcohol abuse Sister     Twin sister drinks a lot, as did both her parents and brothers  . Stroke Brother     Has 7 brothers, 1 with CVA  . Lupus Mother     History  Substance Use Topics  . Smoking status: Former Smoker -- 0.50 packs/day for 40 years    Types: Cigarettes    Quit date: 09/20/2010  . Smokeless tobacco: Never Used  . Alcohol Use: Yes     Comment: 06/19/2014 "graduated from [place] (for alcohol abuse) in October 2015"    OB History    No data available     Review of Systems  Constitutional: Negative for fever, chills and unexpected weight change.  Eyes: Negative for visual disturbance.  Respiratory: Positive for shortness of breath. Negative for cough and wheezing.   Cardiovascular: Positive for chest pain. Negative for palpitations and leg swelling.  Gastrointestinal: Positive for diarrhea (10x/day, watery nonbloody, x2 days). Negative for nausea, vomiting, abdominal pain, constipation and blood in stool.  Genitourinary: Negative for dysuria, hematuria, vaginal bleeding and vaginal discharge.  Musculoskeletal: Negative for myalgias and arthralgias.  Skin: Negative for rash.  Allergic/Immunologic: Positive for immunocompromised state.  Neurological: Positive for light-headedness (with standing). Negative for dizziness, weakness, numbness and headaches.  Psychiatric/Behavioral: Negative for confusion.   10 Systems reviewed and are negative for acute change except as noted in the HPI.    Allergies  Amitriptyline hcl and Doxycycline hyclate  Home Medications   Prior to Admission medications   Medication Sig Start Date End Date Taking? Authorizing Provider  albuterol (PROAIR HFA) 108 (90 BASE) MCG/ACT inhaler Inhale 1-2 puffs into the lungs every 6 (six) hours as needed for wheezing or shortness of breath. 01/14/14   Jones Bales, MD  amLODipine (NORVASC) 5 MG tablet Take 1 tablet (5 mg total) by mouth daily. 01/23/14 01/23/15  Otho Bellows, MD  calcitRIOL (ROCALTROL) 0.25 MCG capsule Take 0.25 mcg by mouth daily. 05/04/14   Historical Provider, MD  calcium carbonate (TUMS) 500 MG chewable tablet Chew  6 tablets (1,200 mg of elemental calcium total) by mouth 3 (three) times daily. For bone health 11/11/12   Encarnacion Slates, NP  cetirizine (ZYRTEC) 10 MG tablet Take 1 tablet (10 mg total) by mouth daily as needed for allergies. 03/05/14   Otho Bellows, MD  colchicine 0.6 MG tablet Take 0.5 tablets (0.3 mg total) by mouth daily. 12/09/13 06/19/14  Jerrye Noble, MD  feeding supplement, ENSURE COMPLETE, (ENSURE COMPLETE) LIQD Take 237 mLs by mouth 3 (three) times daily between meals. 08/27/13   Dorian Heckle, MD  FLUoxetine (PROZAC) 10 MG capsule Take 1 capsule (10 mg total) by mouth daily. For depression 03/26/14   Otho Bellows, MD  folic acid (FOLVITE) 1 MG tablet Take 1 tablet (1 mg total) by mouth daily. For folic acid replacement 03/05/14   Otho Bellows, MD  furosemide (LASIX) 40 MG tablet Take 0.5 tablets (20 mg total) by mouth daily. 01/28/14   Otho Bellows, MD  gabapentin (NEURONTIN) 300 MG capsule Take 2 capsules (600 mg total) by mouth 3 (three) times daily. For anxiety/pain control 01/29/14   Nischal Narendra, MD  guaiFENesin-dextromethorphan (ROBITUSSIN DM) 100-10 MG/5ML syrup Take 5 mLs by mouth every 4 (four) hours as needed for cough. 05/06/14   Otho Bellows, MD  lipase/protease/amylase (CREON) 12000 UNITS CPEP capsule Take 1 capsule (12,000 Units total) by mouth 3 (three) times daily before meals. 03/26/14   Otho Bellows, MD  lisinopril (PRINIVIL,ZESTRIL) 10 MG tablet Take 1 tablet (10 mg total) by mouth daily. 01/28/14   Otho Bellows, MD  magnesium oxide (MAG-OX) 400 MG tablet Take 400 mg by mouth 4 (four) times daily.    Historical Provider, MD  metoprolol tartrate (LOPRESSOR) 25 MG tablet Take 12.5 mg by mouth 2 (two) times daily.    Historical Provider, MD  Multiple Vitamin (MULTIVITAMIN WITH MINERALS) TABS tablet Take 1 tablet by mouth daily. For vitamin replacement    Otho Bellows, MD  omeprazole (PRILOSEC) 40 MG capsule Take 1 capsule (40 mg total) by mouth daily.  03/26/14   Otho Bellows, MD  sodium bicarbonate 650 MG tablet Take 1 tablet (650 mg total) by mouth 2 (two) times daily. 08/19/14   Otho Bellows, MD  thiamine (VITAMIN B-1) 100 MG tablet Take 1 tablet (100 mg total) by mouth daily. For low thiamine    Otho Bellows, MD  vitamin B-12 (CYANOCOBALAMIN) 250 MCG tablet Take 1 tablet (250 mcg total) by mouth every evening. 04/10/13   Otho Bellows, MD  Vitamin D, Ergocalciferol, (DRISDOL) 50000 UNITS CAPS capsule Take 1 capsule (50,000 Units total) by mouth every 7 (seven) days. Thursdays: For bone health 07/14/14  10/08/14  Otho Bellows, MD   BP 122/87 mmHg  Pulse 54  Temp(Src) 98.5 F (36.9 C) (Oral)  Resp 34  Ht 5' 1"  (1.549 m)  SpO2 97% Physical Exam  Constitutional: She is oriented to person, place, and time. Vital signs are normal. She appears cachectic.  Non-toxic appearance. She appears distressed (tachypneic).  Afebrile, nontoxic, thin and frail elderly female who appears older than stated age. Increased work of breathing  HENT:  Head: Normocephalic and atraumatic.  Mouth/Throat: Oropharynx is clear and moist and mucous membranes are normal.  Eyes: Conjunctivae and EOM are normal. Right eye exhibits no discharge. Left eye exhibits no discharge.  Neck: Normal range of motion. Neck supple. No JVD present.  No JVD  Cardiovascular: Regular rhythm, normal heart sounds and intact distal pulses.  Tachycardia present.  Exam reveals no gallop and no friction rub.   No murmur heard. Mildly tachycardic in the 110s, reg rhythm, nl s1/s2, no m/r/g, no pedal edema, distal pulses intact  Pulmonary/Chest: Breath sounds normal. Tachypnea noted. She is in respiratory distress (mildly increased effort). She has no decreased breath sounds. She has no wheezes. She has no rhonchi. She has no rales. She exhibits tenderness. She exhibits no crepitus, no deformity and no retraction.    CTAB in all lung fields, no w/r/r, mildly increased WOB/tachypenic  but speaking in full sentences, SpO2 97% on 4L via [place] (home oxygen) Mild chest wall TTP over R side and sternum, no crepitus or deformity, no retractions  Abdominal: Soft. Normal appearance and bowel sounds are normal. She exhibits no distension. There is no tenderness. There is no rigidity, no rebound, no guarding, no CVA tenderness, no tenderness at McBurney's point and negative Murphy's sign.  Musculoskeletal: Normal range of motion.  MAE x4 Strength and sensation grossly intact Distal pulses intact No pedal edema, neg homan's bilaterally   Neurological: She is alert and oriented to person, place, and time. She has normal strength. No sensory deficit.  Skin: Skin is warm, dry and intact. No rash noted.  Psychiatric: She has a normal mood and affect.  Nursing note and vitals reviewed.   ED Course  Procedures (including critical care time) 18:11 Orthostatic Vital Signs KH  Orthostatic Lying  - BP- Lying: 108/74 mmHg ; Pulse- Lying: 121  Orthostatic Sitting - BP- Sitting: 100/80 mmHg ; Pulse- Sitting: 121  Orthostatic Standing at 0 minutes - BP- Standing at 0 minutes: 92/70 mmHg ; Pulse- Standing at 0 minutes: 138       Labs Review Labs Reviewed  BRAIN NATRIURETIC PEPTIDE - Abnormal; Notable for the following:    B Natriuretic Peptide 2401.2 (*)    All other components within normal limits  MAGNESIUM - Abnormal; Notable for the following:    Magnesium 0.6 (*)    All other components within normal limits  CBC WITH DIFFERENTIAL/PLATELET - Abnormal; Notable for the following:    WBC 28.0 (*)    RBC 2.41 (*)    Hemoglobin 7.3 (*)    HCT 23.0 (*)    Platelets 87 (*)    Neutrophils Relative % 84 (*)    Lymphocytes Relative 7 (*)    Neutro Abs 23.5 (*)    Monocytes Absolute 2.5 (*)    All other components within normal limits  COMPREHENSIVE METABOLIC PANEL - Abnormal; Notable for the following:    Sodium 132 (*)    Potassium 2.5 (*)    CO2 6 (*)    Glucose, Bld 174 (*)  Creatinine, Ser 4.14 (*)    Calcium <4.0 (*)    Albumin 1.1 (*)    AST 44 (*)    Alkaline Phosphatase 234 (*)    GFR calc non Af Amer 11 (*)    GFR calc Af Amer 13 (*)    Anion gap 17 (*)    All other components within normal limits  URINALYSIS, ROUTINE W REFLEX MICROSCOPIC - Abnormal; Notable for the following:    Color, Urine AMBER (*)    APPearance TURBID (*)    Hgb urine dipstick MODERATE (*)    Bilirubin Urine SMALL (*)    Protein, ur >300 (*)    All other components within normal limits  ETHANOL - Abnormal; Notable for the following:    Alcohol, Ethyl (B) 105 (*)    All other components within normal limits  URINE MICROSCOPIC-ADD ON - Abnormal; Notable for the following:    Squamous Epithelial / LPF FEW (*)    Bacteria, UA MANY (*)    All other components within normal limits  PHOSPHORUS  LIPASE, BLOOD  GI PATHOGEN PANEL BY PCR, STOOL  URINE RAPID DRUG SCREEN (HOSP PERFORMED)  I-STAT TROPOININ, ED  I-STAT ARTERIAL BLOOD GAS, ED    Imaging Review Dg Chest Port 1 View  09/17/2014   CLINICAL DATA:  Shortness of breath. Diarrhea with dizziness for 4 days. History of hypertension, alcohol abuse, tuberculosis, pneumonia and congestive heart failure.  EXAM: PORTABLE CHEST - 1 VIEW  COMPARISON:  Radiographs 12/29/2013 and 12/17/2013.  FINDINGS: 1758 hour. The heart size has decreased and is now normal. The mediastinal contours are normal. The lungs are clear. There is no pleural effusion or pneumothorax. Old healed fractures of both proximal humeri are noted. There is no evidence of acute fracture. Telemetry leads overlie the chest.  IMPRESSION: No active cardiopulmonary process.   Electronically Signed   By: Richardean Sale M.D.   On: 09/17/2014 17:23     EKG Interpretation   Date/Time:  Thursday September 17 2014 16:56:33 EDT Ventricular Rate:  127 PR Interval:    QRS Duration: 62 QT Interval:  388 QTC Calculation: 563 R Axis:   54 Text Interpretation:  Critical Test Result:  Long QTc Accelerated  Junctional rhythm Low voltage QRS Cannot rule out Anteroseptal infarct ,  age undetermined Otherwise no significant change Confirmed by HARRISON   MD, FORREST ([id number]) on 09/17/2014 5:07:17 PM      MDM   Final diagnoses:  SOB (shortness of breath)  Hypomagnesemia  Hypokalemia  Alcoholism    59 y.o. female with multiple medical problems here with worsening SOB x5wks, CP for "a few days", lightheadedness with standing, and 2 days of diarrhea. Clear lung exam. Tachypneic, tachycardic, on oxygen (same as home amount), no s/sx of DVT, but PE is on the differential. Ddimer likely won't help given her medical conditions, and can't get CTA due to kidney disease, will proceed with work up of labs and CXR, if nothing returns then may consider attempting to get V/Q scan. CP is reproducible on exam, and pt has vague complaint of multiple days of intermittent pain. Will hold on ASA/NTG until work up more completed. Will monitor and re-eval after labs.   6:38 PM Orthostatic VS positive. Will start gentle fluids. So far preliminary CBC showing slightly worsened anemia at 7.3 (baseline ~8), and EtOH level 105. BNP 2401, Magnesium 0.6, phos 2.5. CMP with hypokalemia at 2.5, bicarb 6, cr 4.14, with chronically elevated AST and Alk phos, anion  gap 17. Lipase 13. Trop neg. CXR clear. EKG initially poor quality, requested repeat and still awaiting this. Will proceed with admission. Will also get ABG.   7:15 PM Dr. Gordy Levan of IM residency returning page, will be down to see pt and admit. See their notes for further documentation of care.   BP 124/85 mmHg  Pulse 114  Temp(Src) 98.5 F (36.9 C) (Oral)  Resp 31  Ht 5' 1"  (1.549 m)  SpO2 100%  Meds ordered this encounter  Medications  . 0.9 %  sodium chloride infusion    Sig:   . calcium gluconate 1 g in sodium chloride 0.9 % 100 mL IVPB    Sig:   . magnesium sulfate IVPB 1 g 100 mL    Sig:      Shyana Kulakowski Camprubi-Soms, PA-C 09/17/14  1917  Pamella Pert, MD 09/18/14 1356

## 2014-09-17 NOTE — ED Provider Notes (Signed)
Procedure note: Ultrasound Guided Peripheral IV Ultrasound guided 20 g peripheral 1.88 inch angiocath IV placement performed by me. Indications: Nursing unable to place IV. Details: The right antecubital fossa and upper arm was evaluated with a multifrequency linear probe. Several patent brachial veins are noted. 1 attempts were made to cannulate a right basilic vein under realtime US guidance with successful cannulation of the vein and catheter placement. There is return of non-pulsatile dark red blood. The patient tolerated the procedure well without complications.    Pamella Pert, MD 09/17/14 2026

## 2014-09-17 NOTE — ED Notes (Signed)
Phlebotomy at the bedside  

## 2014-09-18 ENCOUNTER — Encounter (HOSPITAL_COMMUNITY): Payer: Self-pay | Admitting: General Practice

## 2014-09-18 ENCOUNTER — Inpatient Hospital Stay (HOSPITAL_COMMUNITY): Payer: Medicare Other

## 2014-09-18 DIAGNOSIS — I313 Pericardial effusion (noninflammatory): Secondary | ICD-10-CM

## 2014-09-18 DIAGNOSIS — Z22322 Carrier or suspected carrier of Methicillin resistant Staphylococcus aureus: Secondary | ICD-10-CM

## 2014-09-18 DIAGNOSIS — N184 Chronic kidney disease, stage 4 (severe): Secondary | ICD-10-CM

## 2014-09-18 DIAGNOSIS — K219 Gastro-esophageal reflux disease without esophagitis: Secondary | ICD-10-CM

## 2014-09-18 DIAGNOSIS — E873 Alkalosis: Secondary | ICD-10-CM

## 2014-09-18 DIAGNOSIS — R7989 Other specified abnormal findings of blood chemistry: Secondary | ICD-10-CM

## 2014-09-18 DIAGNOSIS — R079 Chest pain, unspecified: Secondary | ICD-10-CM

## 2014-09-18 DIAGNOSIS — Z7409 Other reduced mobility: Secondary | ICD-10-CM

## 2014-09-18 DIAGNOSIS — F1010 Alcohol abuse, uncomplicated: Secondary | ICD-10-CM

## 2014-09-18 DIAGNOSIS — N179 Acute kidney failure, unspecified: Secondary | ICD-10-CM

## 2014-09-18 DIAGNOSIS — I4581 Long QT syndrome: Secondary | ICD-10-CM

## 2014-09-18 DIAGNOSIS — J9600 Acute respiratory failure, unspecified whether with hypoxia or hypercapnia: Secondary | ICD-10-CM

## 2014-09-18 DIAGNOSIS — R778 Other specified abnormalities of plasma proteins: Secondary | ICD-10-CM | POA: Diagnosis present

## 2014-09-18 DIAGNOSIS — E878 Other disorders of electrolyte and fluid balance, not elsewhere classified: Secondary | ICD-10-CM

## 2014-09-18 DIAGNOSIS — D649 Anemia, unspecified: Secondary | ICD-10-CM

## 2014-09-18 DIAGNOSIS — R791 Abnormal coagulation profile: Secondary | ICD-10-CM | POA: Diagnosis present

## 2014-09-18 DIAGNOSIS — I5042 Chronic combined systolic (congestive) and diastolic (congestive) heart failure: Secondary | ICD-10-CM

## 2014-09-18 DIAGNOSIS — E872 Acidosis: Secondary | ICD-10-CM

## 2014-09-18 LAB — MRSA PCR SCREENING: MRSA by PCR: POSITIVE — AB

## 2014-09-18 LAB — GAMMA GT: GGT: 199 U/L — AB (ref 7–51)

## 2014-09-18 LAB — BASIC METABOLIC PANEL
ANION GAP: 14 (ref 5–15)
Anion gap: 12 (ref 5–15)
BUN: 26 mg/dL — ABNORMAL HIGH (ref 6–23)
BUN: 27 mg/dL — AB (ref 6–23)
CALCIUM: 5 mg/dL — AB (ref 8.4–10.5)
CALCIUM: 4.9 mg/dL — AB (ref 8.4–10.5)
CO2: 8 mmol/L — CL (ref 19–32)
CO2: 11 mmol/L — ABNORMAL LOW (ref 19–32)
Chloride: 112 mmol/L (ref 96–112)
Chloride: 112 mmol/L (ref 96–112)
Creatinine, Ser: 4.06 mg/dL — ABNORMAL HIGH (ref 0.50–1.10)
Creatinine, Ser: 4.12 mg/dL — ABNORMAL HIGH (ref 0.50–1.10)
GFR calc Af Amer: 13 mL/min — ABNORMAL LOW (ref >90)
GFR, EST AFRICAN AMERICAN: 13 mL/min — AB (ref >90)
GFR, EST NON AFRICAN AMERICAN: 11 mL/min — AB (ref >90)
GFR, EST NON AFRICAN AMERICAN: 11 mL/min — AB (ref >90)
GLUCOSE: 111 mg/dL — AB (ref 70–99)
Glucose, Bld: 135 mg/dL — ABNORMAL HIGH (ref 70–99)
POTASSIUM: 3.3 mmol/L — AB (ref 3.5–5.1)
POTASSIUM: 2.9 mmol/L — AB (ref 3.5–5.1)
SODIUM: 134 mmol/L — AB (ref 135–145)
Sodium: 135 mmol/L (ref 135–145)

## 2014-09-18 LAB — CBC WITH DIFFERENTIAL/PLATELET
BASOS ABS: 0 10*3/uL (ref 0.0–0.1)
Basophils Relative: 0 % (ref 0–1)
EOS ABS: 0 10*3/uL (ref 0.0–0.7)
Eosinophils Relative: 0 % (ref 0–5)
HCT: 16.1 % — ABNORMAL LOW (ref 36.0–46.0)
Hemoglobin: 5.4 g/dL — CL (ref 12.0–15.0)
LYMPHS ABS: 2.5 10*3/uL (ref 0.7–4.0)
Lymphocytes Relative: 8 % — ABNORMAL LOW (ref 12–46)
MCH: 30.6 pg (ref 26.0–34.0)
MCHC: 32.9 g/dL (ref 30.0–36.0)
MCV: 93.1 fL (ref 78.0–100.0)
Monocytes Absolute: 3.8 10*3/uL — ABNORMAL HIGH (ref 0.1–1.0)
Monocytes Relative: 12 % (ref 3–12)
NEUTROS ABS: 25.5 10*3/uL — AB (ref 1.7–7.7)
Neutrophils Relative %: 80 % — ABNORMAL HIGH (ref 43–77)
Platelets: 50 10*3/uL — ABNORMAL LOW (ref 150–400)
RBC: 1.73 MIL/uL — ABNORMAL LOW (ref 3.87–5.11)
RDW: 14.9 % (ref 11.5–15.5)
WBC: 31.8 10*3/uL — AB (ref 4.0–10.5)

## 2014-09-18 LAB — LACTIC ACID, PLASMA
Lactic Acid, Venous: 2.1 mmol/L (ref 0.5–2.0)
Lactic Acid, Venous: 2.4 mmol/L (ref 0.5–2.0)
Lactic Acid, Venous: 2.9 mmol/L (ref 0.5–2.0)

## 2014-09-18 LAB — DIC (DISSEMINATED INTRAVASCULAR COAGULATION)PANEL
Fibrinogen: 401 mg/dL (ref 204–475)
INR: 3.31 — ABNORMAL HIGH (ref 0.00–1.49)
Platelets: 49 10*3/uL — ABNORMAL LOW (ref 150–400)
Smear Review: NONE SEEN
aPTT: 54 seconds — ABNORMAL HIGH (ref 24–37)

## 2014-09-18 LAB — MAGNESIUM
Magnesium: 1.4 mg/dL — ABNORMAL LOW (ref 1.5–2.5)
Magnesium: 1.2 mg/dL — ABNORMAL LOW (ref 1.5–2.5)

## 2014-09-18 LAB — PROTIME-INR
INR: 5.01 (ref 0.00–1.49)
INR: 2 — ABNORMAL HIGH (ref 0.00–1.49)
PROTHROMBIN TIME: 47.1 s — AB (ref 11.6–15.2)
Prothrombin Time: 22.9 seconds — ABNORMAL HIGH (ref 11.6–15.2)

## 2014-09-18 LAB — PREPARE RBC (CROSSMATCH)

## 2014-09-18 LAB — HIV ANTIBODY (ROUTINE TESTING W REFLEX): HIV Screen 4th Generation wRfx: NONREACTIVE

## 2014-09-18 LAB — TROPONIN I
TROPONIN I: 0.06 ng/mL — AB (ref <0.031)
Troponin I: 0.06 ng/mL — ABNORMAL HIGH (ref <0.031)

## 2014-09-18 LAB — CALCIUM, IONIZED: Calcium, Ion: 0.76 mmol/L — ABNORMAL LOW (ref 1.12–1.32)

## 2014-09-18 LAB — PHOSPHORUS: PHOSPHORUS: 2.5 mg/dL (ref 2.3–4.6)

## 2014-09-18 LAB — APTT: APTT: 42 s — AB (ref 24–37)

## 2014-09-18 LAB — DIC (DISSEMINATED INTRAVASCULAR COAGULATION) PANEL
D DIMER QUANT: 3.09 ug{FEU}/mL — AB (ref 0.00–0.48)
PROTHROMBIN TIME: 33.9 s — AB (ref 11.6–15.2)

## 2014-09-18 LAB — LACTATE DEHYDROGENASE: LDH: 361 U/L — ABNORMAL HIGH (ref 94–250)

## 2014-09-18 MED ORDER — VANCOMYCIN HCL IN DEXTROSE 750-5 MG/150ML-% IV SOLN
750.0000 mg | Freq: Once | INTRAVENOUS | Status: AC
  Administered 2014-09-18: 750 mg via INTRAVENOUS
  Filled 2014-09-18: qty 150

## 2014-09-18 MED ORDER — SODIUM CHLORIDE 0.9 % IV SOLN
2.0000 g | Freq: Once | INTRAVENOUS | Status: AC
  Administered 2014-09-18: 2 g via INTRAVENOUS
  Filled 2014-09-18: qty 20

## 2014-09-18 MED ORDER — PANTOPRAZOLE SODIUM 40 MG IV SOLR
40.0000 mg | Freq: Once | INTRAVENOUS | Status: AC
  Administered 2014-09-18: 40 mg via INTRAVENOUS
  Filled 2014-09-18: qty 40

## 2014-09-18 MED ORDER — SODIUM CHLORIDE 0.9 % IV SOLN
Freq: Once | INTRAVENOUS | Status: AC
  Administered 2014-09-18: 11:00:00 via INTRAVENOUS

## 2014-09-18 MED ORDER — MUPIROCIN 2 % EX OINT
1.0000 "application " | TOPICAL_OINTMENT | Freq: Two times a day (BID) | CUTANEOUS | Status: AC
  Administered 2014-09-18 – 2014-09-22 (×10): 1 via NASAL
  Filled 2014-09-18 (×5): qty 22

## 2014-09-18 MED ORDER — SODIUM CHLORIDE 0.9 % IV SOLN: Freq: Once | INTRAVENOUS | Status: DC

## 2014-09-18 MED ORDER — POTASSIUM CHLORIDE CRYS ER 20 MEQ PO TBCR: 40.0000 meq | EXTENDED_RELEASE_TABLET | Freq: Two times a day (BID) | ORAL | Status: DC

## 2014-09-18 MED ORDER — SODIUM BICARBONATE 650 MG PO TABS
1300.0000 mg | ORAL_TABLET | Freq: Two times a day (BID) | ORAL | Status: DC
  Administered 2014-09-18 – 2014-09-20 (×5): 1300 mg via ORAL
  Filled 2014-09-18 (×5): qty 2

## 2014-09-18 MED ORDER — MAGNESIUM SULFATE 2 GM/50ML IV SOLN
2.0000 g | Freq: Once | INTRAVENOUS | Status: AC
  Administered 2014-09-18: 2 g via INTRAVENOUS
  Filled 2014-09-18: qty 50

## 2014-09-18 MED ORDER — POTASSIUM CHLORIDE CRYS ER 20 MEQ PO TBCR
40.0000 meq | EXTENDED_RELEASE_TABLET | Freq: Two times a day (BID) | ORAL | Status: DC
  Administered 2014-09-19 – 2014-09-21 (×6): 40 meq via ORAL
  Filled 2014-09-18 (×6): qty 2

## 2014-09-18 MED ORDER — CHLORHEXIDINE GLUCONATE CLOTH 2 % EX PADS
6.0000 | MEDICATED_PAD | Freq: Every day | CUTANEOUS | Status: AC
  Administered 2014-09-19 – 2014-09-22 (×4): 6 via TOPICAL

## 2014-09-18 MED ORDER — MAGNESIUM CHLORIDE 64 MG PO TBEC
2.0000 | DELAYED_RELEASE_TABLET | Freq: Every day | ORAL | Status: DC
  Filled 2014-09-18: qty 2

## 2014-09-18 MED ORDER — VANCOMYCIN HCL 500 MG IV SOLR
500.0000 mg | INTRAVENOUS | Status: DC
  Administered 2014-09-20: 500 mg via INTRAVENOUS
  Filled 2014-09-18: qty 500

## 2014-09-18 MED ORDER — PIPERACILLIN-TAZOBACTAM 3.375 G IVPB 30 MIN
3.3750 g | Freq: Once | INTRAVENOUS | Status: AC
  Administered 2014-09-18: 3.375 g via INTRAVENOUS
  Filled 2014-09-18 (×2): qty 50

## 2014-09-18 MED ORDER — SODIUM CHLORIDE 0.9 % IV SOLN
INTRAVENOUS | Status: DC
  Administered 2014-09-18: 17:00:00 via INTRAVENOUS

## 2014-09-18 MED ORDER — POTASSIUM CHLORIDE CRYS ER 20 MEQ PO TBCR
30.0000 meq | EXTENDED_RELEASE_TABLET | Freq: Once | ORAL | Status: AC
  Administered 2014-09-18: 30 meq via ORAL
  Filled 2014-09-18 (×2): qty 1

## 2014-09-18 MED ORDER — MAGNESIUM CHLORIDE 64 MG PO TBEC
2.0000 | DELAYED_RELEASE_TABLET | Freq: Every day | ORAL | Status: DC
  Administered 2014-09-19 – 2014-09-21 (×3): 128 mg via ORAL
  Filled 2014-09-18 (×3): qty 2

## 2014-09-18 MED ORDER — PRO-STAT SUGAR FREE PO LIQD
30.0000 mL | Freq: Three times a day (TID) | ORAL | Status: DC
  Administered 2014-09-18 – 2014-09-20 (×4): 30 mL via ORAL
  Filled 2014-09-18 (×25): qty 30

## 2014-09-18 MED ORDER — PIPERACILLIN-TAZOBACTAM IN DEX 2-0.25 GM/50ML IV SOLN
2.2500 g | Freq: Three times a day (TID) | INTRAVENOUS | Status: DC
  Administered 2014-09-18 – 2014-09-20 (×6): 2.25 g via INTRAVENOUS
  Filled 2014-09-18 (×8): qty 50

## 2014-09-18 MED ORDER — CALCIUM CARBONATE 1250 (500 CA) MG PO TABS
1.0000 | ORAL_TABLET | Freq: Two times a day (BID) | ORAL | Status: DC
  Administered 2014-09-18 – 2014-09-23 (×10): 500 mg via ORAL
  Filled 2014-09-18 (×10): qty 1

## 2014-09-18 NOTE — Progress Notes (Signed)
[name] came out and is longer able to receive Banana bag and sodium bicarb together. Physician notified and was told to continue to run sodium bicarb. Will continue to monitor.  [name] [name] E

## 2014-09-18 NOTE — Progress Notes (Signed)
09/18/2014 1115 Chart reviewed. Utilization review complete. Jonnie Finner RN Case Mgmt phone [phone]

## 2014-09-18 NOTE — Progress Notes (Signed)
ANTIBIOTIC CONSULT NOTE - INITIAL  Pharmacy Consult for zosyn/vancomycin Indication: sepsis  Allergies  Allergen Reactions  . Amitriptyline Hcl Swelling    In the face.  . Doxycycline Hyclate Itching    Feels like something crawling under her skin    Patient Measurements: Height: 5\' 1"  (154.9 cm) Weight: 88 lb 4.8 oz (40.053 kg) IBW/kg (Calculated) : 47.8  Vital Signs: Temp: 98 F (36.7 C) (04/15 0420) Temp Source: Oral (04/15 0420) BP: 101/75 mmHg (04/15 0420) Pulse Rate: 116 (04/15 0420) Intake/Output from previous day:   Intake/Output from this shift:    Labs:  Recent Labs  09/17/14 1748 09/17/14 1849 09/18/14 0630  WBC 28.0*  --  PENDING  HGB 7.3*  --  5.4*  PLT 87*  --  PENDING  LABCREA  --  188.23  --   CREATININE 4.14*  --  4.06*   Estimated Creatinine Clearance: 9.7 mL/min (by C-G formula based on Cr of 4.06). No results for input(s): VANCOTROUGH, VANCOPEAK, VANCORANDOM, GENTTROUGH, GENTPEAK, GENTRANDOM, TOBRATROUGH, TOBRAPEAK, TOBRARND, AMIKACINPEAK, AMIKACINTROU, AMIKACIN in the last 72 hours.   Microbiology: Recent Results (from the past 720 hour(s))  MRSA PCR Screening     Status: Abnormal   Collection Time: 09/17/14  9:31 PM  Result Value Ref Range Status   MRSA by PCR POSITIVE (A) NEGATIVE Final    Comment:        The GeneXpert MRSA Assay (FDA approved for NASAL specimens only), is one component of a comprehensive MRSA colonization surveillance program. It is not intended to diagnose MRSA infection nor to guide or monitor treatment for MRSA infections. RESULT CALLED TO, READ BACK BY AND VERIFIED WITH: S.COOPER,RN AT 0030 BY L.PITT 09/18/14     Medical History: Past Medical History  Diagnosis Date  . Anemia, B12 deficiency   . History of acute pancreatitis   . Right knee pain     No recent imaging on chart  . 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 Dr. Mancel Bale, [facility]  . Hypertriglyceridemia   . GERD (gastroesophageal reflux disease)   . Subdural hematoma 02/2008    Likely 2/2 trauma from seizure from EtOH withdrawal, chronic in nature, sees Dr. Jerene Bears. Most recent CT head 10/2009 showing stable but persistent hematoma without mass effect.  . History of seizure disorder     Likely 2/2 alcohol abuse  . Hypocalcemia   . Hypomagnesemia   . Failure to thrive in childhood     Unclear etiology  . HTN (hypertension)   . Thrombocytopenia   . Hepatomegaly     On exam  . Joint pain   . Alcohol abuse   . Vitamin D deficiency   . Pancreatitis   . Insomnia   . Hyperlipidemia   . Pernicious anemia   . Macrocytic anemia   . 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 Depression   . Fx humeral neck 04/17/2011    Transverse fracture- minimally displaced- managed as outpatient   . ABNORMAL PAP SMEAR, LGSIL 07/23/2008    Annotation: HPV positive CIN I Dr. Mancel Bale, [facility] Qualifier: Diagnosis of  By: Oretha Ellis    . Pneumonia 05/20/2012  . Arthritis     "shoulders" (08/15/2013)  . 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  Marland Kitchen Chronic diastolic CHF (congestive heart failure)   . Hypomagnesemia   .  Seizures     "don't know when/why I had them; daughter was always there w/me"  . On home oxygen therapy     "3L; mostly at night" (06/19/2014)    Medications:  Prescriptions prior to admission  Medication Sig Dispense Refill Last Dose  . albuterol (PROAIR HFA) 108 (90 BASE) MCG/ACT inhaler Inhale 1-2 puffs into the lungs every 6 (six) hours as needed for wheezing or shortness of breath. 1 Inhaler 1 not used  . amLODipine (NORVASC) 5 MG tablet Take 1 tablet (5 mg total) by mouth daily. 90 tablet 3 Past Week at Unknown time  . calcitRIOL (ROCALTROL) 0.25 MCG capsule Take 0.25 mcg by mouth daily.   Past Week at Unknown time  . calcium carbonate (TUMS) 500 MG chewable tablet Chew 6 tablets (1,200 mg  of elemental calcium total) by mouth 3 (three) times daily. For bone health 90 tablet 3 Past Week at Unknown time  . cetirizine (ZYRTEC) 10 MG tablet Take 1 tablet (10 mg total) by mouth daily as needed for allergies. 30 tablet 6 Past Month at Unknown time  . colchicine 0.6 MG tablet Take 0.5 tablets (0.3 mg total) by mouth daily. 40 tablet 0 Past Week at Unknown time  . feeding supplement, ENSURE COMPLETE, (ENSURE COMPLETE) LIQD Take 237 mLs by mouth 3 (three) times daily between meals. 90 Bottle 1 Past Week at Unknown time  . FLUoxetine (PROZAC) 10 MG capsule Take 1 capsule (10 mg total) by mouth daily. For depression 90 capsule 3 Past Week at Unknown time  . folic acid (FOLVITE) 1 MG tablet Take 1 tablet (1 mg total) by mouth daily. For folic acid replacement 30 tablet 6 Past Week at Unknown time  . furosemide (LASIX) 40 MG tablet Take 0.5 tablets (20 mg total) by mouth daily. 45 tablet 4 Past Week at Unknown time  . gabapentin (NEURONTIN) 300 MG capsule Take 2 capsules (600 mg total) by mouth 3 (three) times daily. For anxiety/pain control 540 capsule 1 Past Week at Unknown time  . guaiFENesin-dextromethorphan (ROBITUSSIN DM) 100-10 MG/5ML syrup Take 5 mLs by mouth every 4 (four) hours as needed for cough. 118 mL 0 Past Month at Unknown time  . lipase/protease/amylase (CREON) 12000 UNITS CPEP capsule Take 1 capsule (12,000 Units total) by mouth 3 (three) times daily before meals. 270 capsule 3 Past Week at Unknown time  . magnesium oxide (MAG-OX) 400 MG tablet Take 400 mg by mouth 2 (two) times daily.    Past Week at Unknown time  . metoprolol tartrate (LOPRESSOR) 25 MG tablet Take 12.5 mg by mouth 2 (two) times daily.   Past Week at 2200  . Multiple Vitamin (MULTIVITAMIN WITH MINERALS) TABS tablet Take 1 tablet by mouth daily. For vitamin replacement 90 tablet 4 Past Week at Unknown time  . omeprazole (PRILOSEC) 40 MG capsule Take 1 capsule (40 mg total) by mouth daily. 90 capsule 3 Past Week at  Unknown time  . sodium bicarbonate 650 MG tablet Take 1 tablet (650 mg total) by mouth 2 (two) times daily. 60 tablet 3 Past Week at Unknown time  . thiamine (VITAMIN B-1) 100 MG tablet Take 1 tablet (100 mg total) by mouth daily. For low thiamine 90 tablet 3 Past Week at Unknown time  . vitamin B-12 (CYANOCOBALAMIN) 250 MCG tablet Take 1 tablet (250 mcg total) by mouth every evening. 90 tablet 3 Past Week at Unknown time   Scheduled:  . sodium chloride   Intravenous Once  .  calcitRIOL  0.25 mcg Oral Daily  . Chlorhexidine Gluconate Cloth  6 each Topical Q0600  . folic acid  1 mg Oral Daily  . lipase/protease/amylase  12,000 Units Oral TID AC  . multivitamin with minerals  1 tablet Oral Daily  . mupirocin ointment  1 application Nasal BID  . pantoprazole  40 mg Oral Daily  . sodium chloride  3 mL Intravenous Q12H  . thiamine  100 mg Oral Daily   Or  . thiamine  100 mg Intravenous Daily    Assessment: 59 yo female here with SOB and CP and noted with acute respiratory failure and initial lactic acid of 5.6 . Pharmacy has been consulted to begin vancomycin and zosyn for possible sepsis. WBC= 28, afebrile, SCr= 4.06 (SCr was 2.02-2.89 in the last 9 months) and CrCl ~ 10.   4/15 zosyn>> 4/15 vancomycin>>  4/14 urine 4/14 blood x1  Goal of Therapy:  Vancomycin trough level 15-20 mcg/ml  Plan:  -Zosyn 3.375gm x1 followed by 2.25gm IV q8h -Vancomycin 750mg  IV x1 followed by 500mg  IV q48hr -Will follow renal function, cultures and clinical progress  Hildred Laser, Pharm D 09/18/2014 8:25 AM

## 2014-09-18 NOTE — Evaluation (Signed)
Physical Therapy Evaluation Patient Details Name: EMSLEY CUSTER MRN: [id number] DOB: June 16, 1955 Today's Date: 09/18/2014   History of Present Illness  Pt adm with acute respiratory failure and diarrhea. PMH - lupus, ETOH abuse, HTN, CKD, CHF, pericardial window  Clinical Impression  Pt admitted with above diagnosis. Pt currently with functional limitations due to the deficits listed below (see PT Problem List).  Pt will benefit from skilled PT to increase their independence and safety with mobility to allow discharge to the venue listed below.       Follow Up Recommendations Home health PT    Equipment Recommendations  Other (comment) (to be assessed)    Recommendations for Other Services       Precautions / Restrictions Precautions Precautions: Fall      Mobility  Bed Mobility Overal bed mobility: Needs Assistance Bed Mobility: Supine to Sit;Sit to Supine     Supine to sit: Min assist;HOB elevated Sit to supine: Modified independent (Device/Increase time)   General bed mobility comments: assist to bring trunk up  Transfers Overall transfer level: Needs assistance Equipment used: None Transfers: Sit to/from Stand Sit to Stand: Min guard         General transfer comment: assist for balance and safety.  Ambulation/Gait Ambulation/Gait assistance: Min assist Ambulation Distance (Feet): 12 Feet (x 2) Assistive device: 1 person hand held assist Gait Pattern/deviations: Step-through pattern;Decreased step length - right;Decreased step length - left   Gait velocity interpretation: Below normal speed for age/gender General Gait Details: Slightly unsteady with gait and fatigues quickly  Stairs            Wheelchair Mobility    Modified Rankin (Stroke Patients Only)       Balance Overall balance assessment: Needs assistance Sitting-balance support: No upper extremity supported;Feet supported Sitting balance-Leahy Scale: Good     Standing balance  support: Single extremity supported Standing balance-Leahy Scale: Poor Standing balance comment: upper extremity support                             Pertinent Vitals/Pain Pain Assessment: No/denies pain    Home Living Family/patient expects to be discharged to:: Private residence Living Arrangements: Non-relatives/Friends Available Help at Discharge: Family;Available 24 hours/day Type of Home: House Home Access: Stairs to enter Entrance Stairs-Rails: None Entrance Stairs-Number of Steps: 4 Home Layout: One level Home Equipment: Cane - single point Additional Comments: home O2    Prior Function Level of Independence: Needs assistance   Gait / Transfers Assistance Needed: amb with cane. Reports fatigue limits activity           Hand Dominance        Extremity/Trunk Assessment   Upper Extremity Assessment: Defer to OT evaluation           Lower Extremity Assessment: Generalized weakness         Communication      Cognition Arousal/Alertness: Awake/alert Behavior During Therapy: WFL for tasks assessed/performed Overall Cognitive Status: Within Functional Limits for tasks assessed                      General Comments      Exercises        Assessment/Plan    PT Assessment Patient needs continued PT services  PT Diagnosis Difficulty walking;Generalized weakness   PT Problem List Decreased strength;Decreased activity tolerance;Decreased balance;Decreased mobility  PT Treatment Interventions DME instruction;Balance training;Gait training;Functional mobility training;Therapeutic activities;Therapeutic exercise;Patient/family education  PT Goals (Current goals can be found in the Care Plan section) Acute Rehab PT Goals Patient Stated Goal: return home PT Goal Formulation: With patient Time For Goal Achievement: 09/25/14 Potential to Achieve Goals: Good    Frequency Min 3X/week   Barriers to discharge        Co-evaluation                End of Session Equipment Utilized During Treatment: Oxygen Activity Tolerance: Patient limited by fatigue Patient left: in bed;with call bell/phone within reach;with bed alarm set Nurse Communication: Mobility status;Other (comment) (IV beeping and that pt went to bathroom and had diarrhea)         Time: 0301-3143 PT Time Calculation (min) (ACUTE ONLY): 18 min   Charges:   PT Evaluation $Initial PT Evaluation Tier I: 1 Procedure     PT G Codes:        Hollee Fate 10-04-14, 3:11 PM  [facility] PT [phone]

## 2014-09-18 NOTE — Progress Notes (Signed)
INITIAL NUTRITION ASSESSMENT  DOCUMENTATION CODES Per approved criteria  -Severe malnutrition in the context of chronic illness   Pt meets criteria for severe MALNUTRITION in the context of chronic illness as evidenced by <75% estimated energy intake x 1 month, moderate to severe fat and muscle depletion.  INTERVENTION: 30 ml Prostat TID with meals, each supplement provides 100 kcals and 15 grams protein  NUTRITION DIAGNOSIS: Malnutrition related to decreased oral intake as evidenced by moderate to severe fat and muscle depletion, <75% estimated energy intake x 1 month.   Goal: Pt will meet >90% of estimated nutritional needs  Monitor:  Diet advancement, PO intake, labs, weight changes, I/O's  Reason for Assessment: Consult to assess nutritional needs/status  59 y.o. female  Admitting Dx: SOB (shortness of breath)  [name] [name] is a 58 yo woman who presented in acute respiratory failure with chest pain and has been found to have an anion gap metabolic acidosis with respiratory alkalosis.   ASSESSMENT: Pt admitted with SOB.  Hx obtained from pt at bedside. She reports decreased appetite over the past month, reporting that she was consuming a regular diet PTA, however, was only able to consume small portions of food due to not having an appetite. She tolerated her liquids well this morning- breakfast tray revealed 50% of chicken broth and all of jello consumed. Pt anxiously awaiting diet advancement.  Pt reports that she has always been a petite person and wt has been in the 90# range over the past year, which is fairly consistent with diet hx.  She reports consuming Ensure three times daily PTA, however, reports she is now "sick of it". Offered [place], but pt adamantly refused. Will try Prostat supplement. Consider adjusting supplement regimen once diet is advanced for a wider variety of options. Discussed importance of good intake to promote healing.  Labs  reviewed. Na: 134, K: 3.3, CO2: 8, BUN/Creat: 26/4.06, Calcium: 5.0, Mg: 1.4, Glucose: 135. Noted pt on MVI and thiamine supplementation. Also receive pancreatic enzyme before meals. Will order supplement with meals for better nutrient absorption.   Nutrition Focused Physical Exam:  Subcutaneous Fat:  Orbital Region: moderate depletion Upper Arm Region: severe depletion Thoracic and Lumbar Region: moderate depletion  Muscle:  Temple Region: mild depletion Clavicle Bone Region: moderate depletion Clavicle and Acromion Bone Region: moderate depletion Scapular Bone Region: moderate depletion Dorsal Hand: severe depletion Patellar Region: severe depletion Anterior Thigh Region: moderate depletion Posterior Calf Region: moderate depletion  Edema: none present  Height: Ht Readings from Last 1 Encounters:  09/17/14 5\' 1"  (1.549 m)    Weight: Wt Readings from Last 1 Encounters:  09/18/14 88 lb 4.8 oz (40.053 kg)    Ideal Body Weight: 105#  % Ideal Body Weight: 84%  Wt Readings from Last 20 Encounters:  09/18/14 88 lb 4.8 oz (40.053 kg)  07/23/14 83 lb 11.2 oz (37.966 kg)  06/23/14 86 lb 11.2 oz (39.327 kg)  06/20/14 85 lb 1.6 oz (38.6 kg)  06/18/14 85 lb 6.4 oz (38.737 kg)  05/06/14 85 lb 9.6 oz (38.828 kg)  01/14/14 101 lb 12.8 oz (46.176 kg)  01/01/14 104 lb 4.8 oz (47.31 kg)  12/29/13 104 lb (47.174 kg)  12/25/13 104 lb (47.174 kg)  12/18/13 99 lb 3.3 oz (45 kg)  12/17/13 99 lb (44.906 kg)  12/17/13 99 lb 9.6 oz (45.178 kg)  12/12/13 97 lb 6.4 oz (44.18 kg)  12/09/13 99 lb 1.6 oz (44.951 kg)  11/28/13 98 lb 12.8 oz (  44.815 kg)  11/26/13 100 lb 14.4 oz (45.768 kg)  10/29/13 91 lb (41.277 kg)  09/18/13 91 lb 1.6 oz (41.323 kg)  08/27/13 92 lb (41.731 kg)   Usual Body Weight: 90#  % Usual Body Weight: 98%  BMI:  Body mass index is 16.69 kg/(m^2). Underweight  Estimated Nutritional Needs: Kcal: 1400-1600 Protein: 50-60 grams Fluid: 1.4-1.6 L  Skin:  WDL  Diet Order: Diet clear liquid Room service appropriate?: Yes; Fluid consistency:: Thin  EDUCATION NEEDS: -Education needs addressed   Intake/Output Summary (Last 24 hours) at 09/18/14 1105 Last data filed at 09/18/14 0900  Gross per 24 hour  Intake      0 ml  Output      0 ml  Net      0 ml    Last BM: PTA  Labs:   Recent Labs Lab 09/17/14 1748 09/18/14 0630  NA 132* 134*  K 2.5* 3.3*  CL 109 112  CO2 6* 8*  BUN 23 26*  CREATININE 4.14* 4.06*  CALCIUM <4.0* 5.0*  MG 0.6* 1.4*  PHOS 2.5 2.5  GLUCOSE 174* 135*    CBG (last 3)  No results for input(s): GLUCAP in the last 72 hours.  Scheduled Meds: . sodium chloride   Intravenous Once  . calcitRIOL  0.25 mcg Oral Daily  . Chlorhexidine Gluconate Cloth  6 each Topical Q0600  . folic acid  1 mg Oral Daily  . lipase/protease/amylase  12,000 Units Oral TID AC  . multivitamin with minerals  1 tablet Oral Daily  . mupirocin ointment  1 application Nasal BID  . piperacillin-tazobactam (ZOSYN)  IV  2.25 g Intravenous Q8H  . piperacillin-tazobactam  3.375 g Intravenous Once  . sodium chloride  3 mL Intravenous Q12H  . thiamine  100 mg Oral Daily   Or  . thiamine  100 mg Intravenous Daily  . [START ON 09/20/2014] vancomycin  500 mg Intravenous Q48H  . vancomycin  750 mg Intravenous Once    Continuous Infusions: .  sodium bicarbonate 150 mEq in sterile water 1000 mL infusion 75 mL/hr at 09/17/14 2331    Past Medical History  Diagnosis Date  . Anemia, B12 deficiency   . History of acute pancreatitis   . Right knee pain     No recent imaging on chart  . 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 Dr. Mancel Bale, [facility]  . Hypertriglyceridemia   . GERD (gastroesophageal reflux disease)   . Subdural hematoma 02/2008    Likely 2/2 trauma from seizure from EtOH withdrawal, chronic in nature, sees Dr. Jerene Bears. Most recent CT head 10/2009 showing  stable but persistent hematoma without mass effect.  . History of seizure disorder     Likely 2/2 alcohol abuse  . Hypocalcemia   . Hypomagnesemia   . Failure to thrive in childhood     Unclear etiology  . HTN (hypertension)   . Thrombocytopenia   . Hepatomegaly     On exam  . Joint pain   . Alcohol abuse   . Vitamin D deficiency   . Pancreatitis   . Insomnia   . Hyperlipidemia   . Pernicious anemia   . Macrocytic anemia   . 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 Depression   . Fx humeral neck 04/17/2011    Transverse fracture- minimally displaced- managed as outpatient   . ABNORMAL PAP SMEAR, LGSIL 07/23/2008  Annotation: HPV positive CIN I Dr. Mancel Bale, [facility] Qualifier: Diagnosis of  By: Oretha Ellis    . Pneumonia 05/20/2012  . Arthritis     "shoulders" (08/15/2013)  . 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  Marland Kitchen Chronic diastolic CHF (congestive heart failure)   . Hypomagnesemia   . Seizures     "don't know when/why I had them; daughter was always there w/me"  . On home oxygen therapy     "3L; mostly at night" (06/19/2014)  . Shortness of breath dyspnea     Past Surgical History  Procedure Laterality Date  . Cesarean section  1983  . Esophagogastroduodenoscopy  07/11/2011    Procedure: ESOPHAGOGASTRODUODENOSCOPY (EGD);  Surgeon: Beryle Beams, MD;  Location: [facility];  Service: Endoscopy;  Laterality: N/A;  . Colonoscopy  07/11/2011    Procedure: COLONOSCOPY;  Surgeon: Beryle Beams, MD;  Location: [facility];  Service: Endoscopy;  Laterality: N/A;  . Eye surgery Left     "trauma"  . Right colectomy  08/28/2011  . Esophagogastroduodenoscopy N/A 12/01/2012    Procedure: ESOPHAGOGASTRODUODENOSCOPY (EGD);  Surgeon: Irene Shipper, MD;  Location: [facility];  Service: Endoscopy;  Laterality: N/A;  . Colonoscopy with esophagogastroduodenoscopy (egd) Left 08/21/2013    Procedure: COLONOSCOPY WITH  ESOPHAGOGASTRODUODENOSCOPY (EGD);  Surgeon: Beryle Beams, MD;  Location: [facility];  Service: Endoscopy;  Laterality: Left;  . Subxyphoid pericardial window N/A 12/04/2013    Procedure: SUBXYPHOID PERICARDIAL WINDOW WITH TEE;  Surgeon: Ivin Poot, MD;  Location: [place];  Service: Thoracic;  Laterality: N/A;  . Intraoperative transesophageal echocardiogram N/A 12/04/2013    Procedure: INTRAOPERATIVE TRANSESOPHAGEAL ECHOCARDIOGRAM;  Surgeon: Ivin Poot, MD;  Location: [place];  Service: Open Heart Surgery;  Laterality: N/A;    [name] [name] A. Jimmye Norman, RD, LDN, CDE Pager: [phone] After hours Pager: [phone]

## 2014-09-18 NOTE — Consult Note (Signed)
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 The shortness of breath was worse when moving but also present at rest. She has been using her home oxygen at her normal rate of 4L HS for a nonspecific chronic lung disease.  She uses 3 pillows to sleep at night but does not wake up short of breath. She denies any increased edema.  On Apr 21, 2014 creat was 2.41m/dl, bicarb 14 & hgb 8.8gm, in Aug 2015 creat was 4.171mdl    Past Medical History  Diagnosis Date  . Anemia, B12 deficiency   . History of acute pancreatitis   . Right knee pain     No recent imaging on chart  . 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 [facility]  . Hypertriglyceridemia   . GERD (gastroesophageal reflux disease)   . Subdural hematoma 02/2008    Likely 2/2 trauma from seizure from EtOH withdrawal, chronic in nature, sees Dr. NuJerene BearsMost recent CT head 10/2009 showing stable but persistent hematoma without mass effect.  . History of seizure disorder     Likely 2/2 alcohol abuse  . Hypocalcemia   . Hypomagnesemia   . Failure to thrive in childhood     Unclear etiology  . HTN (hypertension)   . Thrombocytopenia   . Hepatomegaly     On exam  . Joint pain   . Alcohol abuse   . Vitamin D deficiency   . Pancreatitis   . Insomnia   . Hyperlipidemia   . Pernicious anemia   . Macrocytic anemia   . 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 Depression   . Fx humeral neck 04/17/2011    Transverse fracture- minimally displaced- managed as outpatient   . ABNORMAL PAP SMEAR, LGSIL 07/23/2008    Annotation: HPV positive CIN I [facility] Qualifier: Diagnosis of  By: AgOretha Ellis  . Pneumonia 05/20/2012  . Arthritis     "shoulders" (08/15/2013)  . 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  . Marland Kitchenhronic diastolic CHF (congestive heart failure)   . Hypomagnesemia   . Seizures     "don't know when/why I had them; daughter was always there w/me"  . On home oxygen therapy     "3L; mostly at night" (06/19/2014)  . Shortness of breath dyspnea    Past Surgical History  Procedure Laterality Date  . Cesarean section  1983  . Esophagogastroduodenoscopy  07/11/2011    Procedure: ESOPHAGOGASTRODUODENOSCOPY (EGD);  Surgeon: PaBeryle BeamsMD;  [facility];  Service: Endoscopy;  Laterality: N/A;  . Colonoscopy  07/11/2011    Procedure: COLONOSCOPY;  Surgeon: PaBeryle BeamsMD;  Location: [facility];  Service: Endoscopy;  Laterality: N/A;  . Eye surgery Left     "trauma"  . Right colectomy  08/28/2011  . Esophagogastroduodenoscopy N/A 12/01/2012    Procedure: ESOPHAGOGASTRODUODENOSCOPY (EGD);  Surgeon: JoIrene ShipperMD;  [facility];  Service: Endoscopy;  Laterality: N/A;  . Colonoscopy with esophagogastroduodenoscopy (egd) Left 08/21/2013    Procedure: COLONOSCOPY  WITH ESOPHAGOGASTRODUODENOSCOPY (EGD);  Surgeon: Beryle Beams, MD;  Location[facility];  Service: Endoscopy;  Laterality: Left;  . Subxyphoid pericardial window N/A 12/04/2013    Procedure: SUBXYPHOID PERICARDIAL WINDOW WITH TEE;  Surgeon: Ivin Poot, MD;  Location: [place];  Service: Thoracic;  Laterality: N/A;  . Intraoperative transesophageal echocardiogram N/A 12/04/2013    Procedure: INTRAOPERATIVE TRANSESOPHAGEAL ECHOCARDIOGRAM;  Surgeon: Ivin Poot, MD;  Location: [place];  Service: Open Heart Surgery;  Laterality: N/A;   Social History:  reports that she quit smoking about 3 years ago. Her smoking use included Cigarettes. She has a 20 pack-year smoking history. She has never used  smokeless tobacco. She reports that she drinks alcohol. She reports that she uses illicit drugs (Marijuana and Cocaine). Allergies:  Allergies  Allergen Reactions  . Amitriptyline Hcl Swelling    In the face.  . Doxycycline Hyclate Itching    Feels like something crawling under her skin   Family History  Problem Relation Age of Onset  . Cancer Mother     Died from stomach cancer and "flesh eating rash  . Heart failure Father     Died in 68s from an MI  . Alcohol abuse Sister     Twin sister drinks a lot, as did both her parents and brothers  . Stroke Brother     Has 7 brothers, 1 with CVA  . Lupus Mother     Medications:  Scheduled: . sodium chloride   Intravenous Once  . calcitRIOL  0.25 mcg Oral Daily  . Chlorhexidine Gluconate Cloth  6 each Topical Q0600  . feeding supplement (PRO-STAT SUGAR FREE 64)  30 mL Oral TID WC  . folic acid  1 mg Oral Daily  . lipase/protease/amylase  12,000 Units Oral TID AC  . multivitamin with minerals  1 tablet Oral Daily  . mupirocin ointment  1 application Nasal BID  . piperacillin-tazobactam (ZOSYN)  IV  2.25 g Intravenous Q8H  . sodium bicarbonate  1,300 mg Oral BID  . sodium chloride  3 mL Intravenous Q12H  . thiamine  100 mg Oral Daily   Or  . thiamine  100 mg Intravenous Daily  . [START ON 09/20/2014] vancomycin  500 mg Intravenous Q48H   ROS: non contributory to acute iollness  Blood pressure 116/72, pulse 110, temperature 100.2 F (37.9 C), temperature source Oral, resp. rate 24, height 5' 1"  (1.549 m), weight 40.053 kg (88 lb 4.8 oz), SpO2 100 %.  General appearance: alert and cooperative Head: Normocephalic, without obvious abnormality, atraumatic Eyes: negative Throat: lips, mucosa, and tongue normal; teeth and gums normal Resp: clear to auscultation bilaterally Chest wall: no tenderness Cardio: regular rate and rhythm, S1, S2 normal, no murmur, click, rub or gallop GI: soft, non-tender; bowel sounds normal; no masses,  no  organomegaly tender RUQ with enlarged liver Extremities: extremities normal, atraumatic, no cyanosis or edema Skin: Skin color, texture, turgor normal. No rashes or lesions Neurologic: Grossly normal Results for orders placed or performed during the hospital encounter of 09/17/14 (from the past 48 hour(s))  BNP (order ONLY if patient complains of dyspnea/SOB AND you have documented it for THIS visit)     Status: Abnormal   Collection Time: 09/17/14  5:48 PM  Result Value Ref Range   B Natriuretic Peptide 2401.2 (H) 0.0 - 100.0 pg/mL  Magnesium     Status: Abnormal   Collection Time: 09/17/14  5:48 PM  Result Value Ref Range   Magnesium 0.6 (  LL) 1.5 - 2.5 mg/dL    Comment: REPEATED TO VERIFY CRITICAL RESULT CALLED TO, READ BACK BY AND VERIFIED WITH: D RICE,RN 1833 09/17/14 D BRADLEY   Phosphorus     Status: None   Collection Time: 09/17/14  5:48 PM  Result Value Ref Range   Phosphorus 2.5 2.3 - 4.6 mg/dL  CBC with Differential     Status: Abnormal   Collection Time: 09/17/14  5:48 PM  Result Value Ref Range   WBC 28.0 (H) 4.0 - 10.5 K/uL    Comment: WHITE COUNT CONFIRMED ON SMEAR   RBC 2.41 (L) 3.87 - 5.11 MIL/uL   Hemoglobin 7.3 (L) 12.0 - 15.0 g/dL   HCT 23.0 (L) 36.0 - 46.0 %   MCV 95.4 78.0 - 100.0 fL   MCH 30.3 26.0 - 34.0 pg   MCHC 31.7 30.0 - 36.0 g/dL   RDW 15.1 11.5 - 15.5 %   Platelets 87 (L) 150 - 400 K/uL    Comment: REPEATED TO VERIFY SPECIMEN CHECKED FOR CLOTS PLATELETS APPEAR DECREASED PLATELET COUNT CONFIRMED BY SMEAR    Neutrophils Relative % 84 (H) 43 - 77 %   Lymphocytes Relative 7 (L) 12 - 46 %   Monocytes Relative 9 3 - 12 %   Eosinophils Relative 0 0 - 5 %   Basophils Relative 0 0 - 1 %   Neutro Abs 23.5 (H) 1.7 - 7.7 K/uL   Lymphs Abs 2.0 0.7 - 4.0 K/uL   Monocytes Absolute 2.5 (H) 0.1 - 1.0 K/uL   Eosinophils Absolute 0.0 0.0 - 0.7 K/uL   Basophils Absolute 0.0 0.0 - 0.1 K/uL   WBC Morphology FEW NEUTROPHIL BANDS NOTED   Comprehensive metabolic  panel     Status: Abnormal   Collection Time: 09/17/14  5:48 PM  Result Value Ref Range   Sodium 132 (L) 135 - 145 mmol/L   Potassium 2.5 (LL) 3.5 - 5.1 mmol/L    Comment: REPEATED TO VERIFY CRITICAL RESULT CALLED TO, READ BACK BY AND VERIFIED WITH: D RICE,RN 1832 09/17/14 D BRADLEY    Chloride 109 96 - 112 mmol/L   CO2 6 (LL) 19 - 32 mmol/L    Comment: REPEATED TO VERIFY CRITICAL RESULT CALLED TO, READ BACK BY AND VERIFIED WITH: D RICE,RN 1833 09/17/14 D BRADLEY    Glucose, Bld 174 (H) 70 - 99 mg/dL   BUN 23 6 - 23 mg/dL   Creatinine, Ser 4.14 (H) 0.50 - 1.10 mg/dL   Calcium <4.0 (LL) 8.4 - 10.5 mg/dL    Comment: REPEATED TO VERIFY CRITICAL RESULT CALLED TO, READ BACK BY AND VERIFIED WITH: D RICE,RN 1833 09/17/14 D BRADLEY    Total Protein 7.5 6.0 - 8.3 g/dL   Albumin 1.1 (L) 3.5 - 5.2 g/dL   AST 44 (H) 0 - 37 U/L   ALT 8 0 - 35 U/L   Alkaline Phosphatase 234 (H) 39 - 117 U/L   Total Bilirubin 0.5 0.3 - 1.2 mg/dL   GFR calc non Af Amer 11 (L) >90 mL/min   GFR calc Af Amer 13 (L) >90 mL/min    Comment: (NOTE) The eGFR has been calculated using the CKD EPI equation. This calculation has not been validated in all clinical situations. eGFR's persistently <90 mL/min signify possible Chronic Kidney Disease.    Anion gap 17 (H) 5 - 15  Lipase, blood     Status: None   Collection Time: 09/17/14  5:48 PM  Result Value Ref Range  Lipase 13 11 - 59 U/L  Ethanol     Status: Abnormal   Collection Time: 09/17/14  5:48 PM  Result Value Ref Range   Alcohol, Ethyl (B) 105 (H) 0 - 9 mg/dL    Comment:        LOWEST DETECTABLE LIMIT FOR SERUM ALCOHOL IS 11 mg/dL FOR MEDICAL PURPOSES ONLY   I-stat troponin, ED (not at [facility])     Status: None   Collection Time: 09/17/14  6:23 PM  Result Value Ref Range   Troponin i, poc 0.08 0.00 - 0.08 ng/mL   Comment 3            Comment: Due to the release kinetics of cTnI, a negative result within the first hours of the onset of symptoms does not  rule out myocardial infarction with certainty. If myocardial infarction is still suspected, repeat the test at appropriate intervals.   Urinalysis, Routine w reflex microscopic     Status: Abnormal   Collection Time: 09/17/14  6:49 PM  Result Value Ref Range   Color, Urine AMBER (A) YELLOW    Comment: BIOCHEMICALS MAY BE AFFECTED BY COLOR   APPearance TURBID (A) CLEAR   Specific Gravity, Urine 1.020 1.005 - 1.030   pH 5.5 5.0 - 8.0   Glucose, UA NEGATIVE NEGATIVE mg/dL   Hgb urine dipstick MODERATE (A) NEGATIVE   Bilirubin Urine SMALL (A) NEGATIVE   Ketones, ur NEGATIVE NEGATIVE mg/dL   Protein, ur >300 (A) NEGATIVE mg/dL   Urobilinogen, UA 0.2 0.0 - 1.0 mg/dL   Nitrite NEGATIVE NEGATIVE   Leukocytes, UA NEGATIVE NEGATIVE  Urine microscopic-add on     Status: Abnormal   Collection Time: 09/17/14  6:49 PM  Result Value Ref Range   Squamous Epithelial / LPF FEW (A) RARE   WBC, UA 0-2 <3 WBC/hpf   RBC / HPF 0-2 <3 RBC/hpf   Bacteria, UA MANY (A) RARE   Urine-Other AMORPHOUS URATES/PHOSPHATES   Urine rapid drug screen (hosp performed)     Status: None   Collection Time: 09/17/14  6:49 PM  Result Value Ref Range   Opiates NONE DETECTED NONE DETECTED   Cocaine NONE DETECTED NONE DETECTED   Benzodiazepines NONE DETECTED NONE DETECTED   Amphetamines NONE DETECTED NONE DETECTED   Tetrahydrocannabinol NONE DETECTED NONE DETECTED   Barbiturates NONE DETECTED NONE DETECTED    Comment:        DRUG SCREEN FOR MEDICAL PURPOSES ONLY.  IF CONFIRMATION IS NEEDED FOR ANY PURPOSE, NOTIFY LAB WITHIN 5 DAYS.        LOWEST DETECTABLE LIMITS FOR URINE DRUG SCREEN Drug Class       Cutoff (ng/mL) Amphetamine      1000 Barbiturate      200 Benzodiazepine   937 Tricyclics       902 Opiates          300 Cocaine          300 THC              50   Creatinine, urine, random     Status: None   Collection Time: 09/17/14  6:49 PM  Result Value Ref Range   Creatinine, Urine 188.23 mg/dL   I-Stat Arterial Blood Gas, ED - (order at [facility] and [facility] only)     Status: Abnormal   Collection Time: 09/17/14  8:43 PM  Result Value Ref Range   pH, Arterial 7.280 (L) 7.350 - 7.450   pCO2 arterial 12.8 (LL)  35.0 - 45.0 mmHg   pO2, Arterial 181.0 (H) 80.0 - 100.0 mmHg   Bicarbonate 6.0 (L) 20.0 - 24.0 mEq/L   TCO2 6 0 - 100 mmol/L   O2 Saturation 100.0 %   Acid-base deficit 18.0 (H) 0.0 - 2.0 mmol/L   Patient temperature 98.6 F    Collection site RADIAL, ALLEN'S TEST ACCEPTABLE    Drawn by RT    Sample type ARTERIAL    Comment NOTIFIED PHYSICIAN   Type and screen     Status: None (Preliminary result)   Collection Time: 09/17/14  8:48 PM  Result Value Ref Range   ABO/RH(D) A POS    Antibody Screen NEG    Sample Expiration 09/20/2014    Unit Number [id number]    Blood Component Type RED CELLS,LR    Unit division 00    Status of Unit ALLOCATED    Transfusion Status OK TO TRANSFUSE    Crossmatch Result Compatible    Unit Number [id number]    Blood Component Type RED CELLS,LR    Unit division 00    Status of Unit ALLOCATED    Transfusion Status OK TO TRANSFUSE    Crossmatch Result Compatible   Protime-INR     Status: Abnormal   Collection Time: 09/17/14  8:48 PM  Result Value Ref Range   Prothrombin Time 38.7 (H) 11.6 - 15.2 seconds   INR 3.92 (H) 0.00 - 1.49  MRSA PCR Screening     Status: Abnormal   Collection Time: 09/17/14  9:31 PM  Result Value Ref Range   MRSA by PCR POSITIVE (A) NEGATIVE    Comment:        The GeneXpert MRSA Assay (FDA approved for NASAL specimens only), is one component of a comprehensive MRSA colonization surveillance program. It is not intended to diagnose MRSA infection nor to guide or monitor treatment for MRSA infections. RESULT CALLED TO, READ BACK BY AND VERIFIED WITH: S.COOPER,RN AT 0030 BY L.PITT 09/18/14   Lactic acid, plasma     Status: Abnormal   Collection Time: 09/17/14 10:57 PM  Result Value Ref Range   Lactic Acid,  Venous 5.6 (HH) 0.5 - 2.0 mmol/L    Comment: REPEATED TO VERIFY CRITICAL RESULT CALLED TO, READ BACK BY AND VERIFIED WITH: COOPER S,RN 09/17/14 2152 WAYK   HIV antibody     Status: None   Collection Time: 09/18/14 12:35 AM  Result Value Ref Range   HIV Screen 4th Generation wRfx Non Reactive Non Reactive    Comment: (NOTE) Performed At: [facility] [place], [place] [id number] Lindon Romp MD RA:[id number]   Troponin I     Status: Abnormal   Collection Time: 09/18/14 12:35 AM  Result Value Ref Range   Troponin I 0.06 (H) <0.031 ng/mL    Comment:        PERSISTENTLY INCREASED TROPONIN VALUES IN THE RANGE OF 0.04-0.49 ng/mL CAN BE SEEN IN:       -UNSTABLE ANGINA       -CONGESTIVE HEART FAILURE       -MYOCARDITIS       -CHEST TRAUMA       -ARRYHTHMIAS       -LATE PRESENTING MYOCARDIAL INFARCTION       -COPD   CLINICAL FOLLOW-UP RECOMMENDED.   Lactic acid, plasma     Status: Abnormal   Collection Time: 09/18/14  2:30 AM  Result Value Ref Range   Lactic Acid, Venous 2.1 (HH) 0.5 - 2.0  mmol/L    Comment: REPEATED TO VERIFY CRITICAL RESULT CALLED TO, READ BACK BY AND VERIFIED WITH: COOPER S,RN 09/18/14 0410 WAYK   Protime-INR     Status: Abnormal   Collection Time: 09/18/14  2:50 AM  Result Value Ref Range   Prothrombin Time 47.1 (H) 11.6 - 15.2 seconds   INR 5.01 (HH) 0.00 - 1.49    Comment: REPEATED TO VERIFY CRITICAL RESULT CALLED TO, READ BACK BY AND VERIFIED WITH: S.COOPER,RN 0425 09/18/14 M.CAMPBELL   APTT     Status: Abnormal   Collection Time: 09/18/14  2:50 AM  Result Value Ref Range   aPTT 42 (H) 24 - 37 seconds    Comment:        IF BASELINE aPTT IS ELEVATED, SUGGEST PATIENT RISK ASSESSMENT BE USED TO DETERMINE APPROPRIATE ANTICOAGULANT THERAPY.   Basic metabolic panel     Status: Abnormal   Collection Time: 09/18/14  6:30 AM  Result Value Ref Range   Sodium 134 (L) 135 - 145 mmol/L   Potassium 3.3 (L) 3.5 - 5.1 mmol/L    Chloride 112 96 - 112 mmol/L   CO2 8 (LL) 19 - 32 mmol/L    Comment: REPEATED TO VERIFY CRITICAL RESULT CALLED TO, READ BACK BY AND VERIFIED WITH: V.THOMPSON,RN 9892 09/18/14 CLARK,S    Glucose, Bld 135 (H) 70 - 99 mg/dL   BUN 26 (H) 6 - 23 mg/dL   Creatinine, Ser 4.06 (H) 0.50 - 1.10 mg/dL   Calcium 5.0 (LL) 8.4 - 10.5 mg/dL    Comment: REPEATED TO VERIFY CRITICAL RESULT CALLED TO, READ BACK BY AND VERIFIED WITH: V.THOMPSON,RN 1194 09/18/14 CLARK,S    GFR calc non Af Amer 11 (L) >90 mL/min   GFR calc Af Amer 13 (L) >90 mL/min    Comment: (NOTE) The eGFR has been calculated using the CKD EPI equation. This calculation has not been validated in all clinical situations. eGFR's persistently <90 mL/min signify possible Chronic Kidney Disease.    Anion gap 14 5 - 15  CBC with Differential/Platelet     Status: Abnormal   Collection Time: 09/18/14  6:30 AM  Result Value Ref Range   WBC 31.8 (H) 4.0 - 10.5 K/uL    Comment: REPEATED TO VERIFY WHITE COUNT CONFIRMED ON SMEAR    RBC 1.73 (L) 3.87 - 5.11 MIL/uL   Hemoglobin 5.4 (LL) 12.0 - 15.0 g/dL    Comment: REPEATED TO VERIFY CRITICAL RESULT CALLED TO, READ BACK BY AND VERIFIED WITH: S.COOPER,RN 09/18/14 0728 BY BSLADE    HCT 16.1 (L) 36.0 - 46.0 %   MCV 93.1 78.0 - 100.0 fL   MCH 30.6 26.0 - 34.0 pg   MCHC 32.9 30.0 - 36.0 g/dL   RDW 14.9 11.5 - 15.5 %   Platelets 50 (L) 150 - 400 K/uL    Comment: SPECIMEN CHECKED FOR CLOTS REPEATED TO VERIFY PLATELET COUNT CONFIRMED BY SMEAR    Neutrophils Relative % 80 (H) 43 - 77 %   Lymphocytes Relative 8 (L) 12 - 46 %   Monocytes Relative 12 3 - 12 %   Eosinophils Relative 0 0 - 5 %   Basophils Relative 0 0 - 1 %   Neutro Abs 25.5 (H) 1.7 - 7.7 K/uL   Lymphs Abs 2.5 0.7 - 4.0 K/uL   Monocytes Absolute 3.8 (H) 0.1 - 1.0 K/uL   Eosinophils Absolute 0.0 0.0 - 0.7 K/uL   Basophils Absolute 0.0 0.0 - 0.1 K/uL   RBC Morphology  RARE NRBCs     Comment: OVAL MACROCYTES   WBC Morphology  DOHLE BODIES     Comment: VACUOLATED NEUTROPHILS  Magnesium     Status: Abnormal   Collection Time: 09/18/14  6:30 AM  Result Value Ref Range   Magnesium 1.4 (L) 1.5 - 2.5 mg/dL  Phosphorus     Status: None   Collection Time: 09/18/14  6:30 AM  Result Value Ref Range   Phosphorus 2.5 2.3 - 4.6 mg/dL  Calcium, ionized     Status: Abnormal   Collection Time: 09/18/14  6:30 AM  Result Value Ref Range   Calcium, Ion 0.76 (L) 1.12 - 1.32 mmol/L    Comment: Performed at [facility]  [facility]     Status: Abnormal   Collection Time: 09/18/14  6:30 AM  Result Value Ref Range   GGT 199 (H) 7 - 51 U/L  Prepare RBC     Status: None   Collection Time: 09/18/14  7:41 AM  Result Value Ref Range   Order Confirmation ORDER PROCESSED BY BLOOD BANK   Troponin I (q 6hr x 3)     Status: Abnormal   Collection Time: 09/18/14  8:12 AM  Result Value Ref Range   Troponin I 0.06 (H) <0.031 ng/mL    Comment:        PERSISTENTLY INCREASED TROPONIN VALUES IN THE RANGE OF 0.04-0.49 ng/mL CAN BE SEEN IN:       -UNSTABLE ANGINA       -CONGESTIVE HEART FAILURE       -MYOCARDITIS       -CHEST TRAUMA       -ARRYHTHMIAS       -LATE PRESENTING MYOCARDIAL INFARCTION       -COPD   CLINICAL FOLLOW-UP RECOMMENDED.   Prepare fresh frozen plasma     Status: None (Preliminary result)   Collection Time: 09/18/14  9:00 AM  Result Value Ref Range   Unit Number [id number]    Blood Component Type THW PLS APHR    Unit division A0    Status of Unit ISSUED    Transfusion Status OK TO TRANSFUSE    Unit Number [id number]    Blood Component Type THW PLS APHR    Unit division B0    Status of Unit ISSUED    Transfusion Status OK TO TRANSFUSE   Lactic acid, plasma     Status: Abnormal   Collection Time: 09/18/14 10:50 AM  Result Value Ref Range   Lactic Acid, Venous 2.4 (HH) 0.5 - 2.0 mmol/L    Comment: REPEATED TO VERIFY CRITICAL RESULT CALLED TO, READ BACK BY AND VERIFIED WITH: V.THOMPSON,RN 1304  09/18/14 CLARK,S   DIC (disseminated intravasc coag) panel     Status: Abnormal   Collection Time: 09/18/14 11:10 AM  Result Value Ref Range   Prothrombin Time 33.9 (H) 11.6 - 15.2 seconds   INR 3.31 (H) 0.00 - 1.49   aPTT 54 (H) 24 - 37 seconds    Comment:        IF BASELINE aPTT IS ELEVATED, SUGGEST PATIENT RISK ASSESSMENT BE USED TO DETERMINE APPROPRIATE ANTICOAGULANT THERAPY.    Fibrinogen 401 204 - 475 mg/dL   D-Dimer, Quant 3.09 (H) 0.00 - 0.48 ug/mL-FEU    Comment:        AT THE INHOUSE ESTABLISHED CUTOFF VALUE OF 0.48 ug/mL FEU, THIS ASSAY HAS BEEN DOCUMENTED IN THE LITERATURE TO HAVE A SENSITIVITY AND NEGATIVE PREDICTIVE VALUE OF AT LEAST 98 TO  99%.  THE TEST RESULT SHOULD BE CORRELATED WITH AN ASSESSMENT OF THE CLINICAL PROBABILITY OF DVT / VTE.    Platelets 49 (L) 150 - 400 K/uL    Comment: SPECIMEN CHECKED FOR CLOTS REPEATED TO VERIFY PLATELET COUNT CONFIRMED BY SMEAR LARGE PLATELETS PRESENT    Smear Review NO SCHISTOCYTES SEEN    Dg Chest Port 1 View  09/17/2014   CLINICAL DATA:  Shortness of breath. Diarrhea with dizziness for 4 days. History of hypertension, alcohol abuse, tuberculosis, pneumonia and congestive heart failure.  EXAM: PORTABLE CHEST - 1 VIEW  COMPARISON:  Radiographs 12/29/2013 and 12/17/2013.  FINDINGS: 1758 hour. The heart size has decreased and is now normal. The mediastinal contours are normal. The lungs are clear. There is no pleural effusion or pneumothorax. Old healed fractures of both proximal humeri are noted. There is no evidence of acute fracture. Telemetry leads overlie the chest.  IMPRESSION: No active cardiopulmonary process.   Electronically Signed   By: Richardean Sale M.D.   On: 09/17/2014 17:23    Assessment:  1 Acute and CKD 3/4 2  Primary metabolic acidosis 3 Severe anemia, thrombocytopenia, ? Evans syndrome 4 SLE  5 Leukocytosis  Plan: 1 Bicarb drip as you are doing 2 Transfusion of PRBC as you have done, may need  Hematology eval 3 Volume replete 4 Will follow  Montford Barg C 09/18/2014, 3:21 PM

## 2014-09-18 NOTE — Care Management Note (Signed)
    Page 1 of 2   09/21/2014     2:57:36 PM CARE MANAGEMENT NOTE 09/21/2014  Patient:  MARISELLA, PUCCIO   Account Number:  [id number]  Date Initiated:  09/18/2014  Documentation initiated by:  [facility]  Subjective/Objective Assessment:   shortness of breath, anemia     Action/Plan:   Anticipated DC Date:     Anticipated DC Plan:  [place]  CM consult      [facility] Choice  HOME HEALTH   Choice offered to / List presented to:  C-1 Patient        [place] arranged  [place] [facility]      [place].   Status of service:  Completed, signed off Medicare Important Message given?  YES (If response is "NO", the following Medicare IM given date fields will be blank) Date Medicare IM given:  09/18/2014 Medicare IM given by:  GRAVES-BIGELOW,Jaedyn Lard Date Additional Medicare IM given:   Additional Medicare IM given by:    Discharge Disposition:  [place]  Per UR Regulation:  Reviewed for med. necessity/level of care/duration of stay  If discussed at [place] of Stay Meetings, dates discussed:   09/22/2014    Comments:  1454 Jacqlyn Krauss, RN,BSN [phone] CM did speak with pt in regards to [facility] services. Pt ia agreeable to HHPT services at this time. CM did make referral to [facility] for [facility] services.  Pt may benefit from [facility] services. SOC to begin within 24-48 hours post d/c.  Will continue to monitor.   09/18/2014 1115 Chart reviewed. Utilization review complete. Jonnie Finner RN Case Mgmt phone [phone]

## 2014-09-18 NOTE — Progress Notes (Signed)
CRITICAL VALUE ALERT  Critical value received:  HGB 5.4  Date of notification:  4/15  Time of notification:  0730  Critical value read back:Yes.    Nurse who received alert:  Mickey Farber  MD notified (1st page):  Dareen Piano  Time of first page:  0735  MD notified (2nd page):  Time of second page:  Responding MD:  Dareen Piano  Time MD responded:  [phone]

## 2014-09-18 NOTE — Progress Notes (Signed)
CRITICAL VALUE ALERT  Critical value received:  Lactic acid 2.6   Date of notification:  09/18/14  Time of notification:  0411  Critical value read back: yes  Nurse who received alert:  Albertina Senegal   MD notified (1st page):  Mallory   Time of first page:  0417  Responding MD:  Sherrine Maples  Time MD responded:  [phone]

## 2014-09-18 NOTE — Progress Notes (Signed)
Echocardiogram 2D Echocardiogram has been performed.  Joelene Millin 09/18/2014, 9:07 AM

## 2014-09-18 NOTE — Progress Notes (Signed)
Lactic Acid 2.4 MD Paged

## 2014-09-18 NOTE — Progress Notes (Signed)
Spoke with RN about patient receiving prbcs running now.  INR still supratherapeutic.  I have resumed sodium bicarb po d/t pt not being able to receive sodium bicarb gtt yet (now receiving prbcs).  Magnesium, potassium, and calcium still low on repeat BMP so will attempt to supplement these po for now since prbcs running now.  Lactic acid still elevated also.  Otherwise, RN states patient tolerated clears today and will advance diet as tolerated.

## 2014-09-18 NOTE — Progress Notes (Signed)
Subjective:    Currently, the patient reports feeling about the same as yesterday.  She says that her shortness of breath has improved, but she still feels weak and has general body aches.  Interval Events: -Remains tachycardic, less tachypneic.   Objective:    Vital Signs:   Temp:  [97.7 F (36.5 C)-98.5 F (36.9 C)] 98 F (36.7 C) (04/15 0420) Pulse Rate:  [54-116] 116 (04/15 0420) Resp:  [25-34] 25 (04/15 0420) BP: (101-143)/(71-96) 101/75 mmHg (04/15 0420) SpO2:  [94 %-100 %] 100 % (04/15 0420) Weight:  [81 lb 14.4 oz (37.15 kg)-88 lb 4.8 oz (40.053 kg)] 88 lb 4.8 oz (40.053 kg) (04/15 0420)    24-hour weight change: Weight change:   Intake/Output:  No intake or output data in the 24 hours ending 09/18/14 0913    Physical Exam: General: Thin, cachectic, alert, in NAD.  Lungs:  Increased respiratory effort.  Clear to auscultation BL without crackles or wheezes.  Heart: Tachycardic without rub.  Chest tenderness to palpation.  Abdomen:  Mild mid-abdominal tenderness.  Extremities: No pretibial edema.     Labs:  Basic Metabolic Panel:  Recent Labs Lab 09/17/14 1748 09/18/14 0630  NA 132* 134*  K 2.5* 3.3*  CL 109 112  CO2 6* 8*  GLUCOSE 174* 135*  BUN 23 26*  CREATININE 4.14* 4.06*  CALCIUM <4.0* 5.0*  MG 0.6* 1.4*  PHOS 2.5 2.5    Liver Function Tests:  Recent Labs Lab 09/17/14 1748  AST 44*  ALT 8  ALKPHOS 234*  BILITOT 0.5  PROT 7.5  ALBUMIN 1.1*    Recent Labs Lab 09/17/14 1748  LIPASE 13   CBC:  Recent Labs Lab 09/17/14 1748 09/18/14 0630  WBC 28.0* 31.8*  NEUTROABS 23.5* 25.5*  HGB 7.3* 5.4*  HCT 23.0* 16.1*  MCV 95.4 93.1  PLT 87* 50*    Cardiac Enzymes:  Recent Labs Lab 09/18/14 0035  TROPONINI 0.06*   Microbiology: Results for orders placed or performed during the hospital encounter of 09/17/14  MRSA PCR Screening     Status: Abnormal   Collection Time: 09/17/14  9:31 PM  Result Value Ref Range Status   MRSA by PCR POSITIVE (A) NEGATIVE Final    Comment:        The GeneXpert MRSA Assay (FDA approved for NASAL specimens only), is one component of a comprehensive MRSA colonization surveillance program. It is not intended to diagnose MRSA infection nor to guide or monitor treatment for MRSA infections. RESULT CALLED TO, READ BACK BY AND VERIFIED WITH: S.COOPER,RN AT 0030 BY L.PITT 09/18/14     Coagulation Studies:  Recent Labs  09/17/14 2048 09/18/14 0250  LABPROT 38.7* 47.1*  INR 3.92* 5.01*    Imaging: Dg Chest Port 1 View  09/17/2014   CLINICAL DATA:  Shortness of breath. Diarrhea with dizziness for 4 days. History of hypertension, alcohol abuse, tuberculosis, pneumonia and congestive heart failure.  EXAM: PORTABLE CHEST - 1 VIEW  COMPARISON:  Radiographs 12/29/2013 and 12/17/2013.  FINDINGS: 1758 hour. The heart size has decreased and is now normal. The mediastinal contours are normal. The lungs are clear. There is no pleural effusion or pneumothorax. Old healed fractures of both proximal humeri are noted. There is no evidence of acute fracture. Telemetry leads overlie the chest.  IMPRESSION: No active cardiopulmonary process.   Electronically Signed   By: Richardean Sale M.D.   On: 09/17/2014 17:23       Medications:    Infusions: .  sodium bicarbonate 150 mEq in sterile water 1000 mL infusion 75 mL/hr at 09/17/14 2331    Scheduled Medications: . sodium chloride   Intravenous Once  . sodium chloride   Intravenous Once  . calcitRIOL  0.25 mcg Oral Daily  . Chlorhexidine Gluconate Cloth  6 each Topical Q0600  . folic acid  1 mg Oral Daily  . lipase/protease/amylase  12,000 Units Oral TID AC  . multivitamin with minerals  1 tablet Oral Daily  . mupirocin ointment  1 application Nasal BID  . pantoprazole (PROTONIX) IV  40 mg Intravenous Once  . piperacillin-tazobactam (ZOSYN)  IV  2.25 g Intravenous Q8H  . piperacillin-tazobactam  3.375 g Intravenous Once  . sodium  chloride  3 mL Intravenous Q12H  . thiamine  100 mg Oral Daily   Or  . thiamine  100 mg Intravenous Daily  . [START ON 09/20/2014] vancomycin  500 mg Intravenous Q48H  . vancomycin  750 mg Intravenous Once    PRN Medications: LORazepam **OR** LORazepam   Assessment/ Plan:    Principal Problem:   SOB (shortness of breath) Active Problems:   Normochromic normocytic anemia   Alcohol abuse   Depression   Hypertension, renal disease   GERD   Seizure disorder   Severe protein-calorie malnutrition   Hypoalbuminemia   Hypomagnesemia   Alcohol dependence   FSGS (focal segmental glomerulosclerosis)   Increased anion gap metabolic acidosis   Systolic and diastolic CHF, chronic   Anemia of chronic disease   Prolonged Q-T interval on ECG   Lupus (systemic lupus erythematosus)   Acute respiratory failure   AKI (acute kidney injury)   Leukocytosis   Chest pain   Hyponatremia   Hypocalcemia   Hypokalemia   Thrombocytopenia   Supratherapeutic INR   Elevated troponin  Anion Gap Metabolic Acidosis with Respiratory Alkalosis:  Concerned about sepsis with elevated white count and lactic acidosis.  Non-gap acidosis likely due to diarrhea. Severe malnutrition also likely playing a role.  Lost second IV, and needs additional access. - Trend lactic acid - Increase sodium bicarbonate in sterile water infusion to 100 mL/hr - Start vancomycin and Zosyn for presumed sepsis. - Consulted PCCM for placement of central line, need to correct INR first. - Consult nutrition.  Acute Respiratory Failure with Chest Pain:  Much improved breathing this morning.  Likely compensatory vs. Early pneumonia not seen on X-ray?  Does have history of pericarditis, so will follow echo. Chest pain reproducible on palpation, so likely MSK. - Echocardiogram - Trending troponins - Consider repeat 2-view chest x-ray when more stable.  Elevated INR: Malnutrition vs. Liver failure? Need to correct for central  line. - FFP 2 units, need to give ABX first. - Recheck INR post-transfusion.  Acute anemia: Hemoglobin down to the 5s this morning.  No evidence of acute bleeding.  Dilutional vs. DIC? - DIC panel - Transfuse 2 units of pRBCs.  Electrolyte Abnormalities:  Most likely due to poor nutrition.  Calcium improved to 5 on recheck. - Follow up BMP and replenish electrolytes as needed. - Plasma osmolality and urine osmolality pending  - Calcitriol 0.25 mcg daily (home medication) - Check vitamin D25? 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 - Renal consult (to see patient in am) - Renal ultrasound - Fractional excretion of urea pending - Continue fluids as above.  Diarrhea:  Resolved. - C diff pending -  GI pathogen panel pending - HIV antibody pending  Chronic Combined Heart Failure:  No fluid overload on exam. - Follow up echo.  GERD:  - Protonix 40 mg daily  History of Hypertension: - Holding norvasc, lasix, lopressor in setting of acute illness  Alcohol Abuse with History of Seizures:  - Receiving Banana bag - CIWA - Seizure precautions  Decreased Functional Status: - PT and OT evaluations  Prolonged QT:  - Avoid QT prolonging medications   DVT PPX - SCD's while in bed  CODE STATUS - Full.  CONSULTS PLACED - PCCM, Renal.  DISPO - Disposition is deferred at this time, awaiting improvement of metabolic acidosis.   Anticipated discharge in approximately 3-6 day(s).   The patient does have a current PCP Eulas Post, Brett Canales, MD) and does need an [facility] hospital follow-up appointment after discharge.    Is the [facility] hospital follow-up appointment a one-time only appointment? not applicable.  Does the patient have transportation limitations that hinder transportation to clinic appointments? yes   SERVICE NEEDED AT [place]         Y = Yes, Blank = No PT:   OT:   RN:   Equipment:   Other:      Length of Stay: 1 day(s)   Signed: Charlesetta Shanks, MD  PGY-1, Internal Medicine Resident Pager: [phone] (7AM-5PM) 09/18/2014, 9:13 AM

## 2014-09-18 NOTE — Progress Notes (Signed)
CRITICAL VALUE ALERT  Critical value received:  C02 8 and Calcium 5.0  Date of notification:  04/15  Time of notification:  0812  Critical value read back:Yes.    Nurse who received alert:  [place]  MD notified (1st page):  Dareen Piano  Time of first page:  0812  MD notified (2nd page):  Time of second page:  Responding MD:    Time MD responded:

## 2014-09-19 ENCOUNTER — Inpatient Hospital Stay (HOSPITAL_COMMUNITY): Payer: Medicare Other

## 2014-09-19 DIAGNOSIS — N189 Chronic kidney disease, unspecified: Secondary | ICD-10-CM

## 2014-09-19 DIAGNOSIS — E878 Other disorders of electrolyte and fluid balance, not elsewhere classified: Secondary | ICD-10-CM | POA: Diagnosis present

## 2014-09-19 DIAGNOSIS — E876 Hypokalemia: Secondary | ICD-10-CM

## 2014-09-19 DIAGNOSIS — R0602 Shortness of breath: Secondary | ICD-10-CM

## 2014-09-19 LAB — CBC
HCT: 24.6 % — ABNORMAL LOW (ref 36.0–46.0)
HEMOGLOBIN: 8.2 g/dL — AB (ref 12.0–15.0)
MCH: 28.6 pg (ref 26.0–34.0)
MCHC: 33.3 g/dL (ref 30.0–36.0)
MCV: 85.7 fL (ref 78.0–100.0)
Platelets: 43 10*3/uL — ABNORMAL LOW (ref 150–400)
RBC: 2.87 MIL/uL — AB (ref 3.87–5.11)
RDW: 16.2 % — ABNORMAL HIGH (ref 11.5–15.5)
WBC: 16.4 10*3/uL — AB (ref 4.0–10.5)

## 2014-09-19 LAB — DIFFERENTIAL
Basophils Absolute: 0 10*3/uL (ref 0.0–0.1)
Basophils Relative: 0 % (ref 0–1)
EOS PCT: 0 % (ref 0–5)
Eosinophils Absolute: 0.1 10*3/uL (ref 0.0–0.7)
LYMPHS PCT: 10 % — AB (ref 12–46)
Lymphs Abs: 1.6 10*3/uL (ref 0.7–4.0)
MONO ABS: 2.2 10*3/uL — AB (ref 0.1–1.0)
Monocytes Relative: 13 % — ABNORMAL HIGH (ref 3–12)
Neutro Abs: 12.5 10*3/uL — ABNORMAL HIGH (ref 1.7–7.7)
Neutrophils Relative %: 76 % (ref 43–77)

## 2014-09-19 LAB — PREPARE FRESH FROZEN PLASMA

## 2014-09-19 LAB — SODIUM, URINE, RANDOM: Sodium, Ur: 77 mmol/L

## 2014-09-19 LAB — TYPE AND SCREEN
ABO/RH(D): A POS
Antibody Screen: NEGATIVE
UNIT DIVISION: 0
Unit division: 0

## 2014-09-19 LAB — BASIC METABOLIC PANEL
Anion gap: 15 (ref 5–15)
Anion gap: 14 (ref 5–15)
BUN: 27 mg/dL — ABNORMAL HIGH (ref 6–23)
BUN: 28 mg/dL — ABNORMAL HIGH (ref 6–23)
CHLORIDE: 112 mmol/L (ref 96–112)
CHLORIDE: 110 mmol/L (ref 96–112)
CO2: 11 mmol/L — ABNORMAL LOW (ref 19–32)
CO2: 12 mmol/L — AB (ref 19–32)
Calcium: 4.8 mg/dL — CL (ref 8.4–10.5)
Calcium: 5.5 mg/dL — CL (ref 8.4–10.5)
Creatinine, Ser: 4.03 mg/dL — ABNORMAL HIGH (ref 0.50–1.10)
Creatinine, Ser: 3.78 mg/dL — ABNORMAL HIGH (ref 0.50–1.10)
GFR calc Af Amer: 13 mL/min — ABNORMAL LOW (ref >90)
GFR calc Af Amer: 14 mL/min — ABNORMAL LOW (ref >90)
GFR calc non Af Amer: 12 mL/min — ABNORMAL LOW (ref >90)
GFR, EST NON AFRICAN AMERICAN: 11 mL/min — AB (ref >90)
GLUCOSE: 110 mg/dL — AB (ref 70–99)
Glucose, Bld: 84 mg/dL (ref 70–99)
Potassium: 3.2 mmol/L — ABNORMAL LOW (ref 3.5–5.1)
Potassium: 3.1 mmol/L — ABNORMAL LOW (ref 3.5–5.1)
SODIUM: 136 mmol/L (ref 135–145)
Sodium: 138 mmol/L (ref 135–145)

## 2014-09-19 LAB — CBC WITH DIFFERENTIAL/PLATELET
Basophils Absolute: 0 10*3/uL (ref 0.0–0.1)
Basophils Relative: 0 % (ref 0–1)
EOS ABS: 0 10*3/uL (ref 0.0–0.7)
EOS PCT: 0 % (ref 0–5)
HCT: 27.9 % — ABNORMAL LOW (ref 36.0–46.0)
Hemoglobin: 9.4 g/dL — ABNORMAL LOW (ref 12.0–15.0)
LYMPHS ABS: 1.5 10*3/uL (ref 0.7–4.0)
LYMPHS PCT: 11 % — AB (ref 12–46)
MCH: 28.9 pg (ref 26.0–34.0)
MCHC: 33.7 g/dL (ref 30.0–36.0)
MCV: 85.8 fL (ref 78.0–100.0)
MONOS PCT: 10 % (ref 3–12)
Monocytes Absolute: 1.4 10*3/uL — ABNORMAL HIGH (ref 0.1–1.0)
NEUTROS ABS: 11.4 10*3/uL — AB (ref 1.7–7.7)
Neutrophils Relative %: 79 % — ABNORMAL HIGH (ref 43–77)
Platelets: 49 10*3/uL — ABNORMAL LOW (ref 150–400)
RBC: 3.25 MIL/uL — ABNORMAL LOW (ref 3.87–5.11)
RDW: 17.5 % — AB (ref 11.5–15.5)
WBC: 14.4 10*3/uL — ABNORMAL HIGH (ref 4.0–10.5)

## 2014-09-19 LAB — LACTIC ACID, PLASMA: LACTIC ACID, VENOUS: 1.5 mmol/L (ref 0.5–2.0)

## 2014-09-19 LAB — MAGNESIUM: MAGNESIUM: 1.9 mg/dL (ref 1.5–2.5)

## 2014-09-19 LAB — PROTIME-INR
INR: 1.59 — AB (ref 0.00–1.49)
PROTHROMBIN TIME: 19.1 s — AB (ref 11.6–15.2)

## 2014-09-19 LAB — OSMOLALITY: OSMOLALITY: 291 mosm/kg (ref 275–300)

## 2014-09-19 LAB — OSMOLALITY, URINE: OSMOLALITY UR: 317 mosm/kg — AB (ref 390–1090)

## 2014-09-19 LAB — TROPONIN I: Troponin I: 0.06 ng/mL — ABNORMAL HIGH (ref <0.031)

## 2014-09-19 LAB — OCCULT BLOOD X 1 CARD TO LAB, STOOL: Fecal Occult Bld: POSITIVE — AB

## 2014-09-19 LAB — CLOSTRIDIUM DIFFICILE BY PCR: Toxigenic C. Difficile by PCR: NEGATIVE

## 2014-09-19 LAB — VITAMIN D 25 HYDROXY (VIT D DEFICIENCY, FRACTURES): Vit D, 25-Hydroxy: 7.2 ng/mL — ABNORMAL LOW (ref 30.0–100.0)

## 2014-09-19 LAB — PROCALCITONIN: Procalcitonin: 3.38 ng/mL

## 2014-09-19 MED ORDER — PANTOPRAZOLE SODIUM 40 MG IV SOLR
40.0000 mg | Freq: Two times a day (BID) | INTRAVENOUS | Status: AC
  Administered 2014-09-19 – 2014-09-20 (×4): 40 mg via INTRAVENOUS
  Filled 2014-09-19 (×4): qty 40

## 2014-09-19 MED ORDER — SODIUM CHLORIDE 0.9 % IV SOLN
2.0000 g | Freq: Once | INTRAVENOUS | Status: AC
  Administered 2014-09-19: 2 g via INTRAVENOUS
  Filled 2014-09-19: qty 20

## 2014-09-19 NOTE — Progress Notes (Signed)
Subjective:   Day of hospitalization: 2  VSS.  Yesterday she received 2 units of prbcs and FFP.  [name] is a chronic alcoholic who has been admitted multiple times in the past for various electrolyte abnormalities including hypomagnesemia, hypocalcemia, and hypokalemia.  She also has a chronic metabolic acidosis previous d/c with po sodium bicarb but has been non-compliant.    Complains that she would like to get some sleep and she is hungry and would like some more solid food.   Objective:   Vital signs in last 24 hours: Filed Vitals:   09/19/14 0015 09/19/14 0018 09/19/14 0500 09/19/14 0656  BP:  116/74  112/80  Pulse:  91  83  Temp: 98.7 F (37.1 C) 98 F (36.7 C)  97.9 F (36.6 C)  TempSrc:  Oral  Oral  Resp:  20  18  Height:      Weight:   40.325 kg (88 lb 14.4 oz)   SpO2:  100%  100%    Weight: Filed Weights   09/17/14 2112 09/18/14 0420 09/19/14 0500  Weight: 37.15 kg (81 lb 14.4 oz) 40.053 kg (88 lb 4.8 oz) 40.325 kg (88 lb 14.4 oz)    I/Os:  Intake/Output Summary (Last 24 hours) at 09/19/14 1034 Last data filed at 09/19/14 0600  Gross per 24 hour  Intake   1998 ml  Output      3 ml  Net   1995 ml    Physical Exam: Constitutional: Vital signs reviewed.  Patient is lying in bed in no acute distress. HEENT: [place]/AT; EOMI, conjunctivae normal, no scleral icterus  Cardiovascular: RRR, no MRG Pulmonary/Chest: normal respiratory effort, no accessory muscle use, CTAB, no wheezes, rales, or rhonchi Abdominal: Soft. +BS, NT/ND Neurological: A&O x3, CN II-XII grossly intact; moving all extremities  Extremities: 2+DP b/l, no C/C/E  Skin: Warm, dry and intact.   Lab Results:  BMP:  Recent Labs Lab 09/17/14 1748  09/18/14 0630 09/18/14 1705 09/19/14 0140 09/19/14 0845  NA 132*  --  134* 135 138 136  K 2.5*  --  3.3* 2.9* 3.2* 3.1*  CL 109  --  112 112 112 110  CO2 6*  --  8* 11* 11* 12*  GLUCOSE 174*  --  135* 111* 110* 84  BUN 23  --  26* 27*  27* 28*  CREATININE 4.14*  --  4.06* 4.12* 4.03* 3.78*  CALCIUM <4.0*  < > 5.0* 4.9* 4.8* 5.5*  MG 0.6*  --  1.4* 1.2*  --  1.9  PHOS 2.5  --  2.5  --   --   --   < > = values in this interval not displayed.  CBC:  Recent Labs Lab 09/19/14 0140 09/19/14 0845  WBC 16.4* 14.4*  NEUTROABS 12.5* 11.4*  HGB 8.2* 9.4*  HCT 24.6* 27.9*  MCV 85.7 85.8  PLT 43* 49*    Coagulation:  Recent Labs Lab 09/18/14 0250 09/18/14 1110 09/18/14 1705 09/19/14 0845  LABPROT 47.1* 33.9* 22.9* 19.1*  INR 5.01* 3.31* 2.00* 1.59*   LFTs:  Recent Labs Lab 09/17/14 1748  AST 44*  ALT 8  ALKPHOS 234*  BILITOT 0.5  PROT 7.5  ALBUMIN 1.1*    Pancreatic Enzymes:  Recent Labs Lab 09/17/14 1748  LIPASE 13    Lactic Acid/Procalcitonin:  Recent Labs Lab 09/18/14 1050 09/18/14 1705 09/19/14 0845  LATICACIDVEN 2.4* 2.9* 1.5    Ammonia: No results for input(s): AMMONIA in the last 168 hours.  Cardiac Enzymes:  Recent Labs Lab 09/18/14 0035 09/18/14 0812 09/19/14 0845  TROPONINI 0.06* 0.06* 0.06*    EKG: EKG Interpretation  Date/Time:  Thursday September 17 2014 16:56:33 EDT Ventricular Rate:  127 PR Interval:    QRS Duration: 62 QT Interval:  388 QTC Calculation: 563 R Axis:   54 Text Interpretation:  Critical Test Result: Long QTc Accelerated Junctional rhythm Low voltage QRS Cannot rule out Anteroseptal infarct , age undetermined Otherwise no significant change Confirmed by HARRISON  MD, FORREST ([id number]) on 09/17/2014 5:07:17 PM   BNP: No results for input(s): PROBNP in the last 168 hours.  D-Dimer:  Recent Labs Lab 09/18/14 1110  DDIMER 3.09*    Urinalysis:  Recent Labs Lab 09/17/14 1849  COLORURINE AMBER*  LABSPEC 1.020  PHURINE 5.5  GLUCOSEU NEGATIVE  HGBUR MODERATE*  BILIRUBINUR SMALL*  KETONESUR NEGATIVE  PROTEINUR >300*  UROBILINOGEN 0.2  NITRITE NEGATIVE  LEUKOCYTESUR NEGATIVE    Micro Results: Recent Results (from the past 240  hour(s))  Urine culture     Status: None (Preliminary result)   Collection Time: 09/17/14  6:49 PM  Result Value Ref Range Status   Specimen Description URINE, CLEAN CATCH  Final   Special Requests Normal  Final   Culture   Final    Culture reincubated for better growth Performed at [facility]    Report Status PENDING  Incomplete  MRSA PCR Screening     Status: Abnormal   Collection Time: 09/17/14  9:31 PM  Result Value Ref Range Status   MRSA by PCR POSITIVE (A) NEGATIVE Final    Comment:        The GeneXpert MRSA Assay (FDA approved for NASAL specimens only), is one component of a comprehensive MRSA colonization surveillance program. It is not intended to diagnose MRSA infection nor to guide or monitor treatment for MRSA infections. RESULT CALLED TO, READ BACK BY AND VERIFIED WITH: S.COOPER,RN AT 0030 BY L.PITT 09/18/14   Culture, blood (routine x 2)     Status: None (Preliminary result)   Collection Time: 09/18/14 12:35 AM  Result Value Ref Range Status   Specimen Description BLOOD RIGHT HAND  Final   Special Requests BOTTLES DRAWN AEROBIC AND ANAEROBIC 3CC  Final   Culture   Final           BLOOD CULTURE RECEIVED NO GROWTH TO DATE CULTURE WILL BE HELD FOR 5 DAYS BEFORE ISSUING A FINAL NEGATIVE REPORT Performed at [facility]    Report Status PENDING  Incomplete    Blood Culture:    Component Value Date/Time   SDES BLOOD RIGHT HAND 09/18/2014 0035   SPECREQUEST BOTTLES DRAWN AEROBIC AND ANAEROBIC 3CC 09/18/2014 0035   CULT  09/18/2014 0035           BLOOD CULTURE RECEIVED NO GROWTH TO DATE CULTURE WILL BE HELD FOR 5 DAYS BEFORE ISSUING A FINAL NEGATIVE REPORT Performed at [facility] PENDING 09/18/2014 0035    Studies/Results: US Renal  09/19/2014   CLINICAL DATA:  Acute kidney injury.  Elevated creatinine.  EXAM: RENAL/URINARY TRACT ULTRASOUND COMPLETE  COMPARISON:  06/30/2013.  FINDINGS: Right Kidney:  Length: 10.1  cm. Increased parenchymal echogenicity. No mass or stone. No hydronephrosis.  Left Kidney:  Length: 10.1 cm. Increased parenchymal echogenicity. 12 mm cyst arises from the upper pole. No other masses. No stones. No hydronephrosis.  Bladder:  Decompressed and not well evaluated.  IMPRESSION: Findings consistent with medical renal  disease with increased renal parenchymal echogenicity. Left renal cyst. No other masses. No hydronephrosis.   Electronically Signed   By: Lajean Manes M.D.   On: 09/19/2014 07:15   Dg Chest Port 1 View  09/17/2014   CLINICAL DATA:  Shortness of breath. Diarrhea with dizziness for 4 days. History of hypertension, alcohol abuse, tuberculosis, pneumonia and congestive heart failure.  EXAM: PORTABLE CHEST - 1 VIEW  COMPARISON:  Radiographs 12/29/2013 and 12/17/2013.  FINDINGS: 1758 hour. The heart size has decreased and is now normal. The mediastinal contours are normal. The lungs are clear. There is no pleural effusion or pneumothorax. Old healed fractures of both proximal humeri are noted. There is no evidence of acute fracture. Telemetry leads overlie the chest.  IMPRESSION: No active cardiopulmonary process.   Electronically Signed   By: Richardean Sale M.D.   On: 09/17/2014 17:23    Medications:  Scheduled Meds: . sodium chloride   Intravenous Once  . calcitRIOL  0.25 mcg Oral Daily  . calcium carbonate  1 tablet Oral BID WC  . Chlorhexidine Gluconate Cloth  6 each Topical Q0600  . feeding supplement (PRO-STAT SUGAR FREE 64)  30 mL Oral TID WC  . folic acid  1 mg Oral Daily  . lipase/protease/amylase  12,000 Units Oral TID AC  . magnesium chloride  2 tablet Oral Daily  . multivitamin with minerals  1 tablet Oral Daily  . mupirocin ointment  1 application Nasal BID  . piperacillin-tazobactam (ZOSYN)  IV  2.25 g Intravenous Q8H  . potassium chloride  40 mEq Oral BID  . sodium bicarbonate  1,300 mg Oral BID  . sodium chloride  3 mL Intravenous Q12H  . thiamine  100 mg  Oral Daily   Or  . thiamine  100 mg Intravenous Daily  . [START ON 09/20/2014] vancomycin  500 mg Intravenous Q48H   Continuous Infusions: . sodium chloride 100 mL/hr at 09/18/14 1723  .  sodium bicarbonate 150 mEq in sterile water 1000 mL infusion 100 mL/hr at 09/19/14 0330   PRN Meds: LORazepam **OR** LORazepam  Antibiotics: Antibiotics Given (last 72 hours)    Date/Time Action Medication Dose Rate   09/18/14 1025 Given   vancomycin (VANCOCIN) IVPB 750 mg/150 ml premix 750 mg 150 mL/hr   09/18/14 1240 Given   piperacillin-tazobactam (ZOSYN) IVPB 3.375 g 3.375 g 100 mL/hr   09/18/14 1525 Given   piperacillin-tazobactam (ZOSYN) IVPB 2.25 g 2.25 g 100 mL/hr   09/19/14 0021 Given   piperacillin-tazobactam (ZOSYN) IVPB 2.25 g 2.25 g 100 mL/hr   09/19/14 0651 Given   piperacillin-tazobactam (ZOSYN) IVPB 2.25 g 2.25 g 100 mL/hr      Day of Hospitalization: 2  Consults: Treatment Team:  Donato Heinz, MD  Assessment/Plan:   Principal Problem:   SOB (shortness of breath) Active Problems:   Normochromic normocytic anemia   Alcohol abuse   Depression   Hypertension, renal disease   Seizure disorder   Severe protein-calorie malnutrition   FSGS (focal segmental glomerulosclerosis)   Increased anion gap metabolic acidosis   Systolic and diastolic CHF, chronic   Anemia of chronic disease   AKI (acute kidney injury)   Leukocytosis   Chest pain   Thrombocytopenia   Elevated INR   Elevated troponin   Electrolyte abnormality  Shortness of breath She is satting 100% on 4L [place] and is not tachypneic.  She was started on vanc/zosyn yesterday for possible PNA although CXR was not suggestive.  Leukocytosis trending down.  Lactic acid normalized.  She is on chronic O2 at home.   -cont vanc/zosyn  -repeat CXR this AM   Electrolyte abnormalities She has chronic hypomagnesemia, hypocalcemia, hypokalemia likely d/t her chronic alcohol abuse.   -repleting these  -repeat BMP this  AM   AKI on CKD Likely d/t volume depletion.  SCr trending down. -cont IVF  Metabolic acidosis -repleting with po sodium bicarb and bicarb gtt -repeat BMP this AM   Elevated INR Unclear etiology.  Trending down.  Lab Results  Component Value Date   INR 1.59* 09/19/2014   INR 2.00* 09/18/2014   INR 3.31* 09/18/2014  -cont to monitor  Disposition Disposition is deferred, awaiting improvement of current medical problems.  Anticipated discharge in approximately 1-2 day(s).   -PT consult   LOS: 2 days   Jones Bales, MD PGY-2, Internal Medicine Teaching Service 09/19/2014, 10:34 AM

## 2014-09-19 NOTE — Progress Notes (Signed)
Pt taking AM meds numerous pills scheduled, taking one at a time began gagging and vomited sm amount mucous with one or 2 pills noted and tinge of bright red blood

## 2014-09-19 NOTE — Progress Notes (Addendum)
  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 She is a frequent admission to [facility], and this time she is admitted with severe sepsis like picture, acidosis, severe electrolyte disturbances, white count of 32, with symptoms of shortness of breath, chest pain, but a negative CXR for infiltrate.   When I met her, this morning, she reported feeling better. However, she has had 5 episodes of loose watery diarrhea since morning. She denies abdominal cramps.  Blood pressure 112/80, pulse 83, temperature 97.9 F (36.6 C), temperature source Oral, resp. rate 18, height $RemoveBe'5\' 1"'plBzEpJlP$  (1.549 m), weight 88 lb 14.4 oz (40.325 kg), SpO2 100 %. She is very cachetic, alert and oriented, comfortable, in no acute distress. PERRL, EOMI. Heart exhibits regular rate and rhythm, no murmurs. Lungs are clear to auscultation. Abdomen is soft and non-tender. There is no pedal edema and good pedal pulses. There are no gross focal neurological deficits apparent.   I have read Dr Ferd Glassing note from today.   Unclear source of infection - I am not sure where she is infected. However, she seems to be responding to the antibiotics - clinically as well hematological indices wise. CDiff has been sent for the diarrhea that started.   AKI seems to be slowly trending down, renal consult appreciated.   INR is trending down.   Metabolic acidosis is marginally better.  Thrombocytopenia remains stable. No overt bleeding noticed.   Corrected calcium is about 7.8  Anemia has improved to 9.4  For now, I will  continue antibiotics presuming infection. I have discussed the care of this patient with my IM team residents. Please see the resident note for details.  Madilyn Fireman 09/19/2014, 2:34 PM.

## 2014-09-19 NOTE — Progress Notes (Signed)
Assessment:  1 Acute and CKD 3/4 (creat 2.26 on Apr 21 7828) 2 Metabolic acidosis 3 Severe anemia, thrombocytopenia,  4 SLE  5 Leukocytosis 6 Hypoalbuminemia  Plan: 1 Bicarb drip as you are doing/ Volume replete 4 Will follow & ck albumin  Subjective: Interval History: Feels better  Objective: Vital signs in last 24 hours: Temp:  [97.9 F (36.6 C)-98.9 F (37.2 C)] 97.9 F (36.6 C) (04/16 0656) Pulse Rate:  [83-120] 83 (04/16 0656) Resp:  [18-26] 18 (04/16 0656) BP: (99-127)/(64-87) 112/80 mmHg (04/16 0656) SpO2:  [96 %-100 %] 100 % (04/16 0656) Weight:  [40.325 kg (88 lb 14.4 oz)] 40.325 kg (88 lb 14.4 oz) (04/16 0500) Weight change: 3.175 kg (7 lb)  Intake/Output from previous day: 04/15 0701 - 04/16 0700 In: 2478 [P.O.:1020; I.V.:300; Blood:1158] Out: 3 [Urine:3] Intake/Output this shift: Total I/O In: 120 [P.O.:120] Out: 400 [Urine:200; Stool:200]  PE: small woman Lungs clear Cor RRR Ext no edema  Lab Results:  Recent Labs  09/19/14 0140 09/19/14 0845  WBC 16.4* 14.4*  HGB 8.2* 9.4*  HCT 24.6* 27.9*  PLT 43* 49*   BMET:  Recent Labs  09/19/14 0140 09/19/14 0845  NA 138 136  K 3.2* 3.1*  CL 112 110  CO2 11* 12*  GLUCOSE 110* 84  BUN 27* 28*  CREATININE 4.03* 3.78*  CALCIUM 4.8* 5.5*    Recent Labs  09/17/14 2048  PTH 150*  Comment   Iron Studies: No results for input(s): IRON, TIBC, TRANSFERRIN, FERRITIN in the last 72 hours. Studies/Results: Dg Chest 2 View  09/19/2014   CLINICAL DATA:  Shortness of breath for 5 days  EXAM: CHEST  2 VIEW  COMPARISON:  09/17/2014  FINDINGS: Cardiac shadow is stable. Bibasilar lower lobe infiltrates are seen. These are new from the prior exam. No acute bony abnormality is noted.  IMPRESSION: Bilateral lower lobe infiltrates.   Electronically Signed   By: Inez Catalina M.D.   On: 09/19/2014 11:07   US Renal  09/19/2014   CLINICAL DATA:  Acute kidney injury.  Elevated creatinine.  EXAM: RENAL/URINARY  TRACT ULTRASOUND COMPLETE  COMPARISON:  06/30/2013.  FINDINGS: Right Kidney:  Length: 10.1 cm. Increased parenchymal echogenicity. No mass or stone. No hydronephrosis.  Left Kidney:  Length: 10.1 cm. Increased parenchymal echogenicity. 12 mm cyst arises from the upper pole. No other masses. No stones. No hydronephrosis.  Bladder:  Decompressed and not well evaluated.  IMPRESSION: Findings consistent with medical renal disease with increased renal parenchymal echogenicity. Left renal cyst. No other masses. No hydronephrosis.   Electronically Signed   By: Lajean Manes M.D.   On: 09/19/2014 07:15   Dg Chest Port 1 View  09/17/2014   CLINICAL DATA:  Shortness of breath. Diarrhea with dizziness for 4 days. History of hypertension, alcohol abuse, tuberculosis, pneumonia and congestive heart failure.  EXAM: PORTABLE CHEST - 1 VIEW  COMPARISON:  Radiographs 12/29/2013 and 12/17/2013.  FINDINGS: 1758 hour. The heart size has decreased and is now normal. The mediastinal contours are normal. The lungs are clear. There is no pleural effusion or pneumothorax. Old healed fractures of both proximal humeri are noted. There is no evidence of acute fracture. Telemetry leads overlie the chest.  IMPRESSION: No active cardiopulmonary process.   Electronically Signed   By: Richardean Sale M.D.   On: 09/17/2014 17:23    LOS: 2 days   Talon Witting C 09/19/2014,3:40 PM

## 2014-09-19 NOTE — Progress Notes (Addendum)
CRITICAL VALUE ALERT  Critical value received: ca 4.8  Date of notification: 09/19/14  Time of notification: 0251  Critical value read back:yes  Nurse who received alert:Trevione Wert  MD notified (1st page): [email or web address]  Time of first page:   MD notified (2nd page):  Time of second page:  Responding MD: dr. Sherrine Maples Time MD responded:     Critical value received for pt's PTH, intact and calcium. Will cont to monitor pt.

## 2014-09-20 LAB — RENAL FUNCTION PANEL
Albumin: <1 g/dL — ABNORMAL LOW (ref 3.5–5.2)
Anion gap: 14 (ref 5–15)
Anion gap: 14 (ref 5–15)
BUN: 22 mg/dL (ref 6–23)
BUN: 24 mg/dL — AB (ref 6–23)
CO2: 21 mmol/L (ref 19–32)
CO2: 19 mmol/L (ref 19–32)
Calcium: 4.6 mg/dL — CL (ref 8.4–10.5)
Calcium: 4.5 mg/dL — CL (ref 8.4–10.5)
Chloride: 102 mmol/L (ref 96–112)
Chloride: 104 mmol/L (ref 96–112)
Creatinine, Ser: 3.32 mg/dL — ABNORMAL HIGH (ref 0.50–1.10)
Creatinine, Ser: 3.39 mg/dL — ABNORMAL HIGH (ref 0.50–1.10)
GFR calc Af Amer: 16 mL/min — ABNORMAL LOW (ref >90)
GFR calc non Af Amer: 14 mL/min — ABNORMAL LOW (ref >90)
GFR calc non Af Amer: 14 mL/min — ABNORMAL LOW (ref >90)
GFR, EST AFRICAN AMERICAN: 17 mL/min — AB (ref >90)
GLUCOSE: 170 mg/dL — AB (ref 70–99)
Glucose, Bld: 97 mg/dL (ref 70–99)
POTASSIUM: 2.5 mmol/L — AB (ref 3.5–5.1)
Phosphorus: <1 mg/dL — CL (ref 2.3–4.6)
Phosphorus: 3.3 mg/dL (ref 2.3–4.6)
Potassium: 3.7 mmol/L (ref 3.5–5.1)
Sodium: 137 mmol/L (ref 135–145)
Sodium: 137 mmol/L (ref 135–145)

## 2014-09-20 LAB — CBC
HCT: 23.6 % — ABNORMAL LOW (ref 36.0–46.0)
HEMOGLOBIN: 8.1 g/dL — AB (ref 12.0–15.0)
MCH: 29 pg (ref 26.0–34.0)
MCHC: 34.3 g/dL (ref 30.0–36.0)
MCV: 84.6 fL (ref 78.0–100.0)
Platelets: 45 10*3/uL — ABNORMAL LOW (ref 150–400)
RBC: 2.79 MIL/uL — ABNORMAL LOW (ref 3.87–5.11)
RDW: 18.2 % — AB (ref 11.5–15.5)
WBC: 7.7 10*3/uL (ref 4.0–10.5)

## 2014-09-20 LAB — PROTIME-INR
INR: 1.63 — AB (ref 0.00–1.49)
PROTHROMBIN TIME: 19.5 s — AB (ref 11.6–15.2)

## 2014-09-20 LAB — HAPTOGLOBIN: Haptoglobin: 204 mg/dL — ABNORMAL HIGH (ref 34–200)

## 2014-09-20 MED ORDER — FOLIC ACID 1 MG PO TABS: 1.0000 mg | ORAL_TABLET | Freq: Every day | ORAL | Status: DC

## 2014-09-20 MED ORDER — SODIUM BICARBONATE 650 MG PO TABS
650.0000 mg | ORAL_TABLET | Freq: Two times a day (BID) | ORAL | Status: DC
  Administered 2014-09-20 – 2014-09-23 (×6): 650 mg via ORAL
  Filled 2014-09-20 (×6): qty 1

## 2014-09-20 MED ORDER — DEXTROSE 5 % IV SOLN
500.0000 mg | INTRAVENOUS | Status: DC
  Administered 2014-09-20: 500 mg via INTRAVENOUS
  Filled 2014-09-20 (×2): qty 0.5

## 2014-09-20 MED ORDER — CALCIUM GLUCONATE 10 % IV SOLN
1.0000 g | Freq: Once | INTRAVENOUS | Status: AC
  Administered 2014-09-20: 1 g via INTRAVENOUS
  Filled 2014-09-20: qty 10

## 2014-09-20 MED ORDER — POTASSIUM CHLORIDE 20 MEQ/15ML (10%) PO SOLN
60.0000 meq | Freq: Once | ORAL | Status: AC
  Administered 2014-09-20: 60 meq via ORAL
  Filled 2014-09-20: qty 45

## 2014-09-20 MED ORDER — SODIUM CHLORIDE 0.9 % IV SOLN
1.0000 g | Freq: Once | INTRAVENOUS | Status: AC
Start: 2014-09-20 — End: 2014-09-20
  Administered 2014-09-20: 1 g via INTRAVENOUS
  Filled 2014-09-20: qty 10

## 2014-09-20 MED ORDER — POTASSIUM PHOSPHATES 15 MMOLE/5ML IV SOLN
30.0000 mmol | Freq: Once | INTRAVENOUS | Status: AC
  Administered 2014-09-20: 30 mmol via INTRAVENOUS
  Filled 2014-09-20: qty 10

## 2014-09-20 MED ORDER — CETIRIZINE HCL 10 MG PO TABS: 10.0000 mg | ORAL_TABLET | Freq: Every day | ORAL | Status: DC | PRN

## 2014-09-20 NOTE — Progress Notes (Signed)
Assessment:  1 Acute and CKD 3/4 (creat 2.26 on Apr 21 2014)--SLE membranous  and global sclerosis, slow improvement 2 Metabolic acidosis, improved 3 Severe anemia, thrombocytopenia,  4 SLE  5 Leukocytosis 6 Hypoalbuminemia, severe  Plan:  K replace  Stop IVFs  Will follow   Subjective: Interval History: Feels less SOB  Objective: Vital signs in last 24 hours: Temp:  [97.7 F (36.5 C)-98.2 F (36.8 C)] 98 F (36.7 C) (04/17 0430) Pulse Rate:  [71-95] 95 (04/17 0600) Resp:  [14-22] 18 (04/17 0600) BP: (98-127)/(51-86) 98/51 mmHg (04/17 0430) SpO2:  [98 %-100 %] 100 % (04/17 0600) Weight:  [40.37 kg (89 lb)] 40.37 kg (89 lb) (04/17 0430) Weight change: 0.045 kg (1.6 oz)  Intake/Output from previous day: 04/16 0701 - 04/17 0700 In: 120 [P.O.:120] Out: 600 [Urine:400; Stool:200] Intake/Output this shift: Total I/O In: 120 [P.O.:120] Out: -   General appearance: alert and cooperative Chest wall: no tenderness GI: soft, non-tender; bowel sounds normal; no masses,  no organomegaly Extremities: extremities normal, atraumatic, no cyanosis or edema  Lab Results:  Recent Labs  09/19/14 0845 09/20/14 0446  WBC 14.4* 7.7  HGB 9.4* 8.1*  HCT 27.9* 23.6*  PLT 49* 45*   BMET:  Recent Labs  09/19/14 0845 09/20/14 0446  NA 136 137  K 3.1* 2.5*  CL 110 102  CO2 12* 21  GLUCOSE 84 97  BUN 28* 22  CREATININE 3.78* 3.32*  CALCIUM 5.5* 4.6*    Recent Labs  09/17/14 2048  PTH 150*  Comment   Iron Studies: No results for input(s): IRON, TIBC, TRANSFERRIN, FERRITIN in the last 72 hours. Studies/Results: Dg Chest 2 View  09/19/2014   CLINICAL DATA:  Shortness of breath for 5 days  EXAM: CHEST  2 VIEW  COMPARISON:  09/17/2014  FINDINGS: Cardiac shadow is stable. Bibasilar lower lobe infiltrates are seen. These are new from the prior exam. No acute bony abnormality is noted.  IMPRESSION: Bilateral lower lobe infiltrates.   Electronically Signed   By: Inez Catalina  M.D.   On: 09/19/2014 11:07   US Renal  09/19/2014   CLINICAL DATA:  Acute kidney injury.  Elevated creatinine.  EXAM: RENAL/URINARY TRACT ULTRASOUND COMPLETE  COMPARISON:  06/30/2013.  FINDINGS: Right Kidney:  Length: 10.1 cm. Increased parenchymal echogenicity. No mass or stone. No hydronephrosis.  Left Kidney:  Length: 10.1 cm. Increased parenchymal echogenicity. 12 mm cyst arises from the upper pole. No other masses. No stones. No hydronephrosis.  Bladder:  Decompressed and not well evaluated.  IMPRESSION: Findings consistent with medical renal disease with increased renal parenchymal echogenicity. Left renal cyst. No other masses. No hydronephrosis.   Electronically Signed   By: Lajean Manes M.D.   On: 09/19/2014 07:15   Scheduled: . sodium chloride   Intravenous Once  . calcitRIOL  0.25 mcg Oral Daily  . calcium carbonate  1 tablet Oral BID WC  . Chlorhexidine Gluconate Cloth  6 each Topical Q0600  . feeding supplement (PRO-STAT SUGAR FREE 64)  30 mL Oral TID WC  . folic acid  1 mg Oral Daily  . lipase/protease/amylase  12,000 Units Oral TID AC  . magnesium chloride  2 tablet Oral Daily  . multivitamin with minerals  1 tablet Oral Daily  . mupirocin ointment  1 application Nasal BID  . pantoprazole (PROTONIX) IV  40 mg Intravenous Q12H  . piperacillin-tazobactam (ZOSYN)  IV  2.25 g Intravenous Q8H  . potassium chloride  60 mEq Oral Once  .  potassium chloride  40 mEq Oral BID  . potassium phosphate IVPB (mmol)  30 mmol Intravenous Once  . sodium bicarbonate  1,300 mg Oral BID  . sodium chloride  3 mL Intravenous Q12H  . thiamine  100 mg Oral Daily   Or  . thiamine  100 mg Intravenous Daily  . vancomycin  500 mg Intravenous Q48H     LOS: 3 days   Trafton Roker C 09/20/2014,11:27 AM

## 2014-09-20 NOTE — Evaluation (Signed)
Occupational Therapy Evaluation and Discharge Patient Details Name: [name] [name] MRN: [id number] DOB: Oct 24, 1955 Today's Date: 09/20/2014    History of Present Illness Pt adm with acute respiratory failure and diarrhea. PMH - lupus, ETOH abuse, HTN, CKD, CHF, pericardial window   Clinical Impression   This 59 yo female admitted with above presents to acute OT at a S level due to increased risk of falls. She reports she has this level of A at home. No further OT needs, we will sign off.    Follow Up Recommendations  No OT follow up    Equipment Recommendations  None recommended by OT       Precautions / Restrictions Precautions Precautions: Fall Restrictions Weight Bearing Restrictions: No      Mobility Bed Mobility Overal bed mobility: Modified Independent Bed Mobility: Supine to Sit;Sit to Supine     Supine to sit: Modified independent (Device/Increase time);HOB elevated Sit to supine: Modified independent (Device/Increase time);HOB elevated      Transfers Overall transfer level: Needs assistance Equipment used: None Transfers: Sit to/from Stand Sit to Stand: Supervision         General transfer comment: S for ambulation to door and back    Balance Overall balance assessment: Needs assistance Sitting-balance support: No upper extremity supported;Feet supported Sitting balance-Leahy Scale: Good     Standing balance support: No upper extremity supported Standing balance-Leahy Scale: Fair Standing balance comment: occassionally reaching for things in room to steadh herself (ie cabinet at sink, foot board of bed)                            ADL                                         General ADL Comments: Overall S due to increased risk for falls     Vision Additional Comments: No change from baseline          Pertinent Vitals/Pain Pain Assessment: 0-10 Pain Score: 2  Pain Location: stomach Pain Descriptors /  Indicators: Aching Pain Intervention(s): Monitored during session     Hand Dominance Right   Extremity/Trunk Assessment Upper Extremity Assessment Upper Extremity Assessment: Overall WFL for tasks assessed           Communication Communication Communication: No difficulties   Cognition Arousal/Alertness: Awake/alert Behavior During Therapy: WFL for tasks assessed/performed Overall Cognitive Status: Within Functional Limits for tasks assessed                                Home Living Family/patient expects to be discharged to:: Private residence Living Arrangements: Non-relatives/Friends Available Help at Discharge: Family;Available 24 hours/day Type of Home: House Home Access: Stairs to enter [employer] of Steps: 4 Entrance Stairs-Rails: None Home Layout: One level     Bathroom Shower/Tub: Tub only   Biochemist, clinical: Standard     Home Equipment: Cane - single point   Additional Comments: home O2      Prior Functioning/Environment Level of Independence: Independent with assistive device(s)  Gait / Transfers Assistance Needed: amb with cane. Reports fatigue limits activity ADL's / Homemaking Assistance Needed: says daughter sometimes helps her get dressed        OT Diagnosis: Generalized weakness  OT Goals(Current goals can be found in the care plan section) Acute Rehab OT Goals Patient Stated Goal: to go home  OT Frequency:                End of Session Equipment Utilized During Treatment:  (None) Nurse Communication:  (Let her know I was finished working with pt so she could give her her vitamins)  Activity Tolerance: Patient tolerated treatment well Patient left: in bed;with call bell/phone within reach   Time: 0955-1015 OT Time Calculation (min): 20 min Charges:  OT General Charges $OT Visit: 1 Procedure OT Evaluation $Initial OT Evaluation Tier I: 1 Procedure  Almon Register [phone] 09/20/2014,  10:26 AM

## 2014-09-20 NOTE — Progress Notes (Signed)
ANTIBIOTIC CONSULT NOTE - INITIAL  Pharmacy Consult for cefepime/vancomycin Indication: sepsis/GPC UTI  Allergies  Allergen Reactions  . Amitriptyline Hcl Swelling    In the face.  . Doxycycline Hyclate Itching    Feels like something crawling under her skin    Patient Measurements: Height: 5\' 1"  (154.9 cm) Weight: 89 lb (40.37 kg) IBW/kg (Calculated) : 47.8  Vital Signs: Temp: 98 F (36.7 C) (04/17 0430) Temp Source: Oral (04/17 0430) BP: 134/89 mmHg (04/17 1350) Pulse Rate: 95 (04/17 0600) Intake/Output from previous day: 04/16 0701 - 04/17 0700 In: 120 [P.O.:120] Out: 600 [Urine:400; Stool:200] Intake/Output from this shift: Total I/O In: 120 [P.O.:120] Out: -   Labs:  Recent Labs  09/17/14 1849  09/19/14 0140 09/19/14 0845 09/20/14 0446  WBC  --   < > 16.4* 14.4* 7.7  HGB  --   < > 8.2* 9.4* 8.1*  PLT  --   < > 43* 49* 45*  LABCREA 188.23  --   --   --   --   CREATININE  --   < > 4.03* 3.78* 3.32*  < > = values in this interval not displayed. Estimated Creatinine Clearance: 11.9 mL/min (by C-G formula based on Cr of 3.32). No results for input(s): VANCOTROUGH, VANCOPEAK, VANCORANDOM, GENTTROUGH, GENTPEAK, GENTRANDOM, TOBRATROUGH, TOBRAPEAK, TOBRARND, AMIKACINPEAK, AMIKACINTROU, AMIKACIN in the last 72 hours.   Microbiology: Recent Results (from the past 720 hour(s))  Urine culture     Status: None (Preliminary result)   Collection Time: 09/17/14  6:49 PM  Result Value Ref Range Status   Specimen Description URINE, CLEAN CATCH  Final   Special Requests Normal  Final   Colony Count   Final    >=100,000 COLONIES/ML Performed at [facility]    Culture   Final    Eldred Performed at [facility]    Report Status PENDING  Incomplete  MRSA PCR Screening     Status: Abnormal   Collection Time: 09/17/14  9:31 PM  Result Value Ref Range Status   MRSA by PCR POSITIVE (A) NEGATIVE Final    Comment:        The GeneXpert MRSA  Assay (FDA approved for NASAL specimens only), is one component of a comprehensive MRSA colonization surveillance program. It is not intended to diagnose MRSA infection nor to guide or monitor treatment for MRSA infections. RESULT CALLED TO, READ BACK BY AND VERIFIED WITH: S.COOPER,RN AT 0030 BY L.PITT 09/18/14   Culture, blood (routine x 2)     Status: None (Preliminary result)   Collection Time: 09/18/14 12:35 AM  Result Value Ref Range Status   Specimen Description BLOOD RIGHT HAND  Final   Special Requests BOTTLES DRAWN AEROBIC AND ANAEROBIC 3CC  Final   Culture   Final           BLOOD CULTURE RECEIVED NO GROWTH TO DATE CULTURE WILL BE HELD FOR 5 DAYS BEFORE ISSUING A FINAL NEGATIVE REPORT Performed at [facility]    Report Status PENDING  Incomplete  Clostridium Difficile by PCR     Status: None   Collection Time: 09/19/14  6:08 PM  Result Value Ref Range Status   C difficile by pcr NEGATIVE NEGATIVE Final    Medical History: Past Medical History  Diagnosis Date  . Anemia, B12 deficiency   . History of acute pancreatitis   . Right knee pain     No recent imaging on chart  . 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 Dr. Mancel Bale, [facility]  . Hypertriglyceridemia   . GERD (gastroesophageal reflux disease)   . Subdural hematoma 02/2008    Likely 2/2 trauma from seizure from EtOH withdrawal, chronic in nature, sees Dr. Jerene Bears. Most recent CT head 10/2009 showing stable but persistent hematoma without mass effect.  . History of seizure disorder     Likely 2/2 alcohol abuse  . Hypocalcemia   . Hypomagnesemia   . Failure to thrive in childhood     Unclear etiology  . HTN (hypertension)   . Thrombocytopenia   . Hepatomegaly     On exam  . Joint pain   . Alcohol abuse   . Vitamin D deficiency   . Pancreatitis   . Insomnia   . Hyperlipidemia   . Pernicious anemia   . Macrocytic anemia   .  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 Depression   . Fx humeral neck 04/17/2011    Transverse fracture- minimally displaced- managed as outpatient   . ABNORMAL PAP SMEAR, LGSIL 07/23/2008    Annotation: HPV positive CIN I Dr. Mancel Bale, [facility] Qualifier: Diagnosis of  By: Oretha Ellis    . Pneumonia 05/20/2012  . Arthritis     "shoulders" (08/15/2013)  . 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  Marland Kitchen Chronic diastolic CHF (congestive heart failure)   . Hypomagnesemia   . Seizures     "don't know when/why I had them; daughter was always there w/me"  . On home oxygen therapy     "3L; mostly at night" (06/19/2014)  . Shortness of breath dyspnea     Medications:  Prescriptions prior to admission  Medication Sig Dispense Refill Last Dose  . albuterol (PROAIR HFA) 108 (90 BASE) MCG/ACT inhaler Inhale 1-2 puffs into the lungs every 6 (six) hours as needed for wheezing or shortness of breath. 1 Inhaler 1 not used  . amLODipine (NORVASC) 5 MG tablet Take 1 tablet (5 mg total) by mouth daily. 90 tablet 3 Past Week at Unknown time  . calcitRIOL (ROCALTROL) 0.25 MCG capsule Take 0.25 mcg by mouth daily.   Past Week at Unknown time  . calcium carbonate (TUMS) 500 MG chewable tablet Chew 6 tablets (1,200 mg of elemental calcium total) by mouth 3 (three) times daily. For bone health 90 tablet 3 Past Week at Unknown time  . cetirizine (ZYRTEC) 10 MG tablet Take 1 tablet (10 mg total) by mouth daily as needed for allergies. 30 tablet 6 Past Month at Unknown time  . colchicine 0.6 MG tablet Take 0.5 tablets (0.3 mg total) by mouth daily. 40 tablet 0 Past Week at Unknown time  . feeding supplement, ENSURE COMPLETE, (ENSURE COMPLETE) LIQD Take 237 mLs by mouth 3 (three) times daily between meals. 90 Bottle 1 Past Week at Unknown time  . FLUoxetine (PROZAC) 10 MG capsule Take 1 capsule (10 mg total) by mouth daily. For depression 90 capsule 3 Past Week  at Unknown time  . folic acid (FOLVITE) 1 MG tablet Take 1 tablet (1 mg total) by mouth daily. For folic acid replacement 30 tablet 6 Past Week at Unknown time  . furosemide (LASIX) 40 MG tablet Take 0.5 tablets (20 mg total) by mouth daily. 45 tablet 4 Past Week at Unknown time  . gabapentin (NEURONTIN) 300 MG capsule Take 2 capsules (  600 mg total) by mouth 3 (three) times daily. For anxiety/pain control 540 capsule 1 Past Week at Unknown time  . guaiFENesin-dextromethorphan (ROBITUSSIN DM) 100-10 MG/5ML syrup Take 5 mLs by mouth every 4 (four) hours as needed for cough. 118 mL 0 Past Month at Unknown time  . lipase/protease/amylase (CREON) 12000 UNITS CPEP capsule Take 1 capsule (12,000 Units total) by mouth 3 (three) times daily before meals. 270 capsule 3 Past Week at Unknown time  . magnesium oxide (MAG-OX) 400 MG tablet Take 400 mg by mouth 2 (two) times daily.    Past Week at Unknown time  . metoprolol tartrate (LOPRESSOR) 25 MG tablet Take 12.5 mg by mouth 2 (two) times daily.   Past Week at 2200  . Multiple Vitamin (MULTIVITAMIN WITH MINERALS) TABS tablet Take 1 tablet by mouth daily. For vitamin replacement 90 tablet 4 Past Week at Unknown time  . omeprazole (PRILOSEC) 40 MG capsule Take 1 capsule (40 mg total) by mouth daily. 90 capsule 3 Past Week at Unknown time  . sodium bicarbonate 650 MG tablet Take 1 tablet (650 mg total) by mouth 2 (two) times daily. 60 tablet 3 Past Week at Unknown time  . thiamine (VITAMIN B-1) 100 MG tablet Take 1 tablet (100 mg total) by mouth daily. For low thiamine 90 tablet 3 Past Week at Unknown time  . vitamin B-12 (CYANOCOBALAMIN) 250 MCG tablet Take 1 tablet (250 mcg total) by mouth every evening. 90 tablet 3 Past Week at Unknown time   Scheduled:  . sodium chloride   Intravenous Once  . calcitRIOL  0.25 mcg Oral Daily  . calcium carbonate  1 tablet Oral BID WC  . Chlorhexidine Gluconate Cloth  6 each Topical Q0600  . feeding supplement (PRO-STAT SUGAR  FREE 64)  30 mL Oral TID WC  . folic acid  1 mg Oral Daily  . lipase/protease/amylase  12,000 Units Oral TID AC  . magnesium chloride  2 tablet Oral Daily  . multivitamin with minerals  1 tablet Oral Daily  . mupirocin ointment  1 application Nasal BID  . pantoprazole (PROTONIX) IV  40 mg Intravenous Q12H  . potassium chloride  40 mEq Oral BID  . potassium phosphate IVPB (mmol)  30 mmol Intravenous Once  . sodium bicarbonate  1,300 mg Oral BID  . sodium chloride  3 mL Intravenous Q12H  . thiamine  100 mg Oral Daily   Or  . thiamine  100 mg Intravenous Daily  . vancomycin  500 mg Intravenous Q48H    Assessment: 59 yo female here with SOB and CP and noted with acute respiratory failure and initial lactic acid of 5.6. Pharmacy consulted to continue D3 vancomycin and switch zosyn to cefepime with GPC UTI. Tmax 98.2, WBC trend down to 7.7; LA 5.6>> 2.1, PCT elevated to 3.38. SCr trend down to 3.32 (CrCl ~12 ml/min)   4/15 zosyn>>4/17 4/15 vancomycin>> 4/17 cefepime>>  4/14 urine>>GPC 4/14 blood x1>>ngtd  Goal of Therapy:  Vancomycin trough level 15-20 mcg/ml  Plan:  - D/c Zosyn - Initiate cefepime 500 mg IV q24h  - Vancomycin 500mg  IV q48hr -Will follow renal function, cultures and clinical progress, levels as indicated  Currie Dennin K. Velva Harman, PharmD, [place] Clinical Pharmacist - Resident Pager: [phone] Pharmacy: [phone] 09/20/2014 2:10 PM

## 2014-09-20 NOTE — Progress Notes (Signed)
Subjective:   Day of hospitalization: 3  VSS.  S/p  2 units of prbcs and FFP.  Ms. Nissen is a chronic alcoholic who has been admitted multiple times in the past for various electrolyte abnormalities including hypomagnesemia, hypocalcemia, and hypokalemia.  She also has a chronic metabolic acidosis previous d/c with po sodium bicarb but has been non-compliant.    Denies further episodes of N/V.  Endorses some CP when she coughs upon questioning, otherwise no complaints.  She is able to tolerate a regular diet.  Still c/o diarrhea which is chronic.  C.diff neg.  CXR yesterday shows bilateral LL infiltrates.  Procalcitonin elevated at 3.38 suggests bacterial PNA.  Urine cx growing gpc.    Objective:   Vital signs in last 24 hours: Filed Vitals:   09/20/14 0400 09/20/14 0430 09/20/14 0500 09/20/14 0600  BP:  98/51    Pulse: 88 94 83 95  Temp:  98 F (36.7 C)    TempSrc:  Oral    Resp: 19 20 19 18   Height:      Weight:  40.37 kg (89 lb)    SpO2: 99% 99% 99% 100%    Weight: Filed Weights   09/18/14 0420 09/19/14 0500 09/20/14 0430  Weight: 40.053 kg (88 lb 4.8 oz) 40.325 kg (88 lb 14.4 oz) 40.37 kg (89 lb)    I/Os:  Intake/Output Summary (Last 24 hours) at 09/20/14 1304 Last data filed at 09/20/14 0830  Gross per 24 hour  Intake    240 ml  Output    200 ml  Net     40 ml    Physical Exam: Constitutional: Vital signs reviewed.  Patient is sleeping in bed in no acute distress. HEENT: [place]/AT; EOMI, conjunctivae normal, no scleral icterus  Cardiovascular: RRR, no MRG Pulmonary/Chest: normal respiratory effort, no accessory muscle use, CTAB, no wheezes, rales, or rhonchi Abdominal: Soft. +BS, diffuse tenderness mainly suprapubic, lower abd, without rebound or guarding  Neurological: A&O x3, CN II-XII grossly intact; moving all extremities  Extremities: 2+DP b/l, no C/C/E  Skin: Warm, dry and intact.   Lab Results:  BMP:  Recent Labs Lab 09/18/14 0630  09/18/14 1705  09/19/14 0845 09/20/14 0446  NA 134* 135  < > 136 137  K 3.3* 2.9*  < > 3.1* 2.5*  CL 112 112  < > 110 102  CO2 8* 11*  < > 12* 21  GLUCOSE 135* 111*  < > 84 97  BUN 26* 27*  < > 28* 22  CREATININE 4.06* 4.12*  < > 3.78* 3.32*  CALCIUM 5.0* 4.9*  < > 5.5* 4.6*  MG 1.4* 1.2*  --  1.9  --   PHOS 2.5  --   --   --  <1.0*  < > = values in this interval not displayed.  CBC:  Recent Labs Lab 09/19/14 0140 09/19/14 0845 09/20/14 0446  WBC 16.4* 14.4* 7.7  NEUTROABS 12.5* 11.4*  --   HGB 8.2* 9.4* 8.1*  HCT 24.6* 27.9* 23.6*  MCV 85.7 85.8 84.6  PLT 43* 49* 45*    Coagulation:  Recent Labs Lab 09/18/14 1110 09/18/14 1705 09/19/14 0845 09/20/14 0446  LABPROT 33.9* 22.9* 19.1* 19.5*  INR 3.31* 2.00* 1.59* 1.63*   LFTs:  Recent Labs Lab 09/17/14 1748 09/20/14 0446  AST 44*  --   ALT 8  --   ALKPHOS 234*  --   BILITOT 0.5  --   PROT 7.5  --  ALBUMIN 1.1* <1.0*    Pancreatic Enzymes:  Recent Labs Lab 09/17/14 1748  LIPASE 13    Lactic Acid/Procalcitonin:  Recent Labs Lab 09/18/14 1050 09/18/14 1705 09/19/14 0845 09/19/14 1400  LATICACIDVEN 2.4* 2.9* 1.5  --   PROCALCITON  --   --   --  3.38    Ammonia: No results for input(s): AMMONIA in the last 168 hours.  Cardiac Enzymes:  Recent Labs Lab 09/18/14 0035 09/18/14 0812 09/19/14 0845  TROPONINI 0.06* 0.06* 0.06*    EKG: EKG Interpretation  Date/Time:  Thursday September 17 2014 16:56:33 EDT Ventricular Rate:  127 PR Interval:    QRS Duration: 62 QT Interval:  388 QTC Calculation: 563 R Axis:   54 Text Interpretation:  Critical Test Result: Long QTc Accelerated Junctional rhythm Low voltage QRS Cannot rule out Anteroseptal infarct , age undetermined Otherwise no significant change Confirmed by HARRISON  MD, FORREST ([id number]) on 09/17/2014 5:07:17 PM   BNP: No results for input(s): PROBNP in the last 168 hours.  D-Dimer:  Recent Labs Lab 09/18/14 1110  DDIMER 3.09*     Urinalysis:  Recent Labs Lab 09/17/14 1849  COLORURINE AMBER*  LABSPEC 1.020  PHURINE 5.5  GLUCOSEU NEGATIVE  HGBUR MODERATE*  BILIRUBINUR SMALL*  KETONESUR NEGATIVE  PROTEINUR >300*  UROBILINOGEN 0.2  NITRITE NEGATIVE  LEUKOCYTESUR NEGATIVE    Micro Results: Recent Results (from the past 240 hour(s))  Urine culture     Status: None (Preliminary result)   Collection Time: 09/17/14  6:49 PM  Result Value Ref Range Status   Specimen Description URINE, CLEAN CATCH  Final   Special Requests Normal  Final   Colony Count   Final    >=100,000 COLONIES/ML Performed at [facility]    Culture   Final    [place] Performed at [facility]    Report Status PENDING  Incomplete  MRSA PCR Screening     Status: Abnormal   Collection Time: 09/17/14  9:31 PM  Result Value Ref Range Status   MRSA by PCR POSITIVE (A) NEGATIVE Final    Comment:        The GeneXpert MRSA Assay (FDA approved for NASAL specimens only), is one component of a comprehensive MRSA colonization surveillance program. It is not intended to diagnose MRSA infection nor to guide or monitor treatment for MRSA infections. RESULT CALLED TO, READ BACK BY AND VERIFIED WITH: S.COOPER,RN AT 0030 BY L.PITT 09/18/14   Culture, blood (routine x 2)     Status: None (Preliminary result)   Collection Time: 09/18/14 12:35 AM  Result Value Ref Range Status   Specimen Description BLOOD RIGHT HAND  Final   Special Requests BOTTLES DRAWN AEROBIC AND ANAEROBIC 3CC  Final   Culture   Final           BLOOD CULTURE RECEIVED NO GROWTH TO DATE CULTURE WILL BE HELD FOR 5 DAYS BEFORE ISSUING A FINAL NEGATIVE REPORT Performed at [facility]    Report Status PENDING  Incomplete  Clostridium Difficile by PCR     Status: None   Collection Time: 09/19/14  6:08 PM  Result Value Ref Range Status   C difficile by pcr NEGATIVE NEGATIVE Final    Blood Culture:    Component Value Date/Time    SDES BLOOD RIGHT HAND 09/18/2014 0035   SPECREQUEST BOTTLES DRAWN AEROBIC AND ANAEROBIC 3CC 09/18/2014 0035   CULT  09/18/2014 0035  BLOOD CULTURE RECEIVED NO GROWTH TO DATE CULTURE WILL BE HELD FOR 5 DAYS BEFORE ISSUING A FINAL NEGATIVE REPORT Performed at [place] PENDING 09/18/2014 0035    Studies/Results: Dg Chest 2 View  09/19/2014   CLINICAL DATA:  Shortness of breath for 5 days  EXAM: CHEST  2 VIEW  COMPARISON:  09/17/2014  FINDINGS: Cardiac shadow is stable. Bibasilar lower lobe infiltrates are seen. These are new from the prior exam. No acute bony abnormality is noted.  IMPRESSION: Bilateral lower lobe infiltrates.   Electronically Signed   By: Inez Catalina M.D.   On: 09/19/2014 11:07   US Renal  09/19/2014   CLINICAL DATA:  Acute kidney injury.  Elevated creatinine.  EXAM: RENAL/URINARY TRACT ULTRASOUND COMPLETE  COMPARISON:  06/30/2013.  FINDINGS: Right Kidney:  Length: 10.1 cm. Increased parenchymal echogenicity. No mass or stone. No hydronephrosis.  Left Kidney:  Length: 10.1 cm. Increased parenchymal echogenicity. 12 mm cyst arises from the upper pole. No other masses. No stones. No hydronephrosis.  Bladder:  Decompressed and not well evaluated.  IMPRESSION: Findings consistent with medical renal disease with increased renal parenchymal echogenicity. Left renal cyst. No other masses. No hydronephrosis.   Electronically Signed   By: Lajean Manes M.D.   On: 09/19/2014 07:15    Medications:  Scheduled Meds: . sodium chloride   Intravenous Once  . calcitRIOL  0.25 mcg Oral Daily  . calcium carbonate  1 tablet Oral BID WC  . Chlorhexidine Gluconate Cloth  6 each Topical Q0600  . feeding supplement (PRO-STAT SUGAR FREE 64)  30 mL Oral TID WC  . folic acid  1 mg Oral Daily  . lipase/protease/amylase  12,000 Units Oral TID AC  . magnesium chloride  2 tablet Oral Daily  . multivitamin with minerals  1 tablet Oral Daily  . mupirocin ointment  1  application Nasal BID  . pantoprazole (PROTONIX) IV  40 mg Intravenous Q12H  . potassium chloride  60 mEq Oral Once  . potassium chloride  40 mEq Oral BID  . potassium phosphate IVPB (mmol)  30 mmol Intravenous Once  . sodium bicarbonate  1,300 mg Oral BID  . sodium chloride  3 mL Intravenous Q12H  . thiamine  100 mg Oral Daily   Or  . thiamine  100 mg Intravenous Daily  . vancomycin  500 mg Intravenous Q48H   Continuous Infusions: . sodium chloride 100 mL/hr at 09/18/14 1723  .  sodium bicarbonate 150 mEq in sterile water 1000 mL infusion 100 mL/hr at 09/20/14 0433   PRN Meds: LORazepam **OR** LORazepam  Antibiotics: Antibiotics Given (last 72 hours)    Date/Time Action Medication Dose Rate   09/18/14 1025 Given   vancomycin (VANCOCIN) IVPB 750 mg/150 ml premix 750 mg 150 mL/hr   09/18/14 1240 Given   piperacillin-tazobactam (ZOSYN) IVPB 3.375 g 3.375 g 100 mL/hr   09/18/14 1525 Given   piperacillin-tazobactam (ZOSYN) IVPB 2.25 g 2.25 g 100 mL/hr   09/19/14 0021 Given   piperacillin-tazobactam (ZOSYN) IVPB 2.25 g 2.25 g 100 mL/hr   09/19/14 0651 Given   piperacillin-tazobactam (ZOSYN) IVPB 2.25 g 2.25 g 100 mL/hr   09/19/14 1346 Given   piperacillin-tazobactam (ZOSYN) IVPB 2.25 g 2.25 g 100 mL/hr   09/19/14 2245 Given   piperacillin-tazobactam (ZOSYN) IVPB 2.25 g 2.25 g 100 mL/hr   09/20/14 0529 Given   piperacillin-tazobactam (ZOSYN) IVPB 2.25 g 2.25 g 100 mL/hr   09/20/14 0954 Given  vancomycin (VANCOCIN) 500 mg in sodium chloride 0.9 % 100 mL IVPB 500 mg 100 mL/hr      Day of Hospitalization: 3  Consults: Treatment Team:  Donato Heinz, MD  Assessment/Plan:   Principal Problem:   SOB (shortness of breath) Active Problems:   Normochromic normocytic anemia   Alcohol abuse   Depression   Hypertension, renal disease   Seizure disorder   Severe protein-calorie malnutrition   FSGS (focal segmental glomerulosclerosis)   Increased anion gap metabolic  acidosis   Systolic and diastolic CHF, chronic   Anemia of chronic disease   AKI (acute kidney injury)   Leukocytosis   Chest pain   Thrombocytopenia   Elevated INR   Elevated troponin   Electrolyte abnormality  Probable HCAP  She is satting 100% on 2L [place] and is not tachypneic.  She was started on vanc/zosyn for sepsis.  Repeat CXR showed bilateral LL infiltrates.  Leukocytosis trending down.  Lactic acid normalized.  She is on chronic O2 at home.   -d/c zosyn and add cefepime -cont vancomycin -Blood cx pending   Electrolyte abnormalities She has chronic hypomagnesemia, hypocalcemia, hypokalemia likely d/t her chronic alcohol abuse.  Renal following. -appreciate renal recs  -repleting these with po/IV  -repeat renal panel in AM   AKI on CKD Likely d/t volume depletion.  SCr trending down. -cont sodium bicarb gtt  Metabolic acidosis Resolving.   -repleting with po sodium bicarb and bicarb gtt -repeat renal panel in AM   Elevated INR Unclear etiology.  Trending down.  Lab Results  Component Value Date   INR 1.63* 09/20/2014   INR 1.59* 09/19/2014   INR 2.00* 09/18/2014  -cont to monitor  ?UTI Pt has suprapubic tenderness with >100,000 gpc.   -already on vancomycin    Disposition Disposition is deferred, awaiting improvement of current medical problems.  Anticipated discharge in approximately 1-2 day(s).   -PT consult   LOS: 3 days   Jones Bales, MD PGY-2, Internal Medicine Teaching Service 09/20/2014, 1:04 PM

## 2014-09-20 NOTE — Progress Notes (Signed)
CRITICAL VALUE ALERT  Critical value received:  K-2.5,Ca-4.6, Albumin-<1, Phos. <1  Date of notification: 09/20/2014  Time of notification:  0642  Critical value read back:Yes  Nurse who received alert: Rebecka Apley RN  MD notified (1st page):  Overton Mam  Time of first page: [phone]  MD notified (2nd page):  Time of second page:  Responding MD:  Overton Mam  Time MD [id number]

## 2014-09-21 DIAGNOSIS — D696 Thrombocytopenia, unspecified: Secondary | ICD-10-CM

## 2014-09-21 DIAGNOSIS — J189 Pneumonia, unspecified organism: Secondary | ICD-10-CM

## 2014-09-21 LAB — PTH, INTACT AND CALCIUM
Calcium, Total (PTH): 4 mg/dL — CL (ref 8.7–10.2)
PTH: 150 pg/mL — ABNORMAL HIGH (ref 15–65)

## 2014-09-21 LAB — RENAL FUNCTION PANEL
ANION GAP: 12 (ref 5–15)
Albumin: <1 g/dL — ABNORMAL LOW (ref 3.5–5.2)
BUN: 23 mg/dL (ref 6–23)
CO2: 19 mmol/L (ref 19–32)
Calcium: 4.9 mg/dL — CL (ref 8.4–10.5)
Chloride: 107 mmol/L (ref 96–112)
Creatinine, Ser: 3.46 mg/dL — ABNORMAL HIGH (ref 0.50–1.10)
GFR calc Af Amer: 16 mL/min — ABNORMAL LOW (ref >90)
GFR calc non Af Amer: 14 mL/min — ABNORMAL LOW (ref >90)
GLUCOSE: 108 mg/dL — AB (ref 70–99)
Phosphorus: 3.2 mg/dL (ref 2.3–4.6)
Potassium: 4 mmol/L (ref 3.5–5.1)
SODIUM: 138 mmol/L (ref 135–145)

## 2014-09-21 LAB — UREA NITROGEN, URINE: Urea Nitrogen, Ur: UNDETERMINED mg/dL

## 2014-09-21 LAB — CBC
HEMATOCRIT: 26.5 % — AB (ref 36.0–46.0)
Hemoglobin: 8.9 g/dL — ABNORMAL LOW (ref 12.0–15.0)
MCH: 29.2 pg (ref 26.0–34.0)
MCHC: 33.6 g/dL (ref 30.0–36.0)
MCV: 86.9 fL (ref 78.0–100.0)
Platelets: 49 10*3/uL — ABNORMAL LOW (ref 150–400)
RBC: 3.05 MIL/uL — ABNORMAL LOW (ref 3.87–5.11)
RDW: 18.7 % — ABNORMAL HIGH (ref 11.5–15.5)
WBC: 7.6 10*3/uL (ref 4.0–10.5)

## 2014-09-21 LAB — URINE CULTURE
Colony Count: >=100000
Special Requests: NORMAL

## 2014-09-21 LAB — MAGNESIUM: Magnesium: 1.1 mg/dL — ABNORMAL LOW (ref 1.5–2.5)

## 2014-09-21 MED ORDER — SODIUM CHLORIDE 0.9 % IV SOLN: INTRAVENOUS | Status: AC

## 2014-09-21 MED ORDER — LEVOFLOXACIN 500 MG PO TABS
500.0000 mg | ORAL_TABLET | ORAL | Status: DC
  Administered 2014-09-23: 500 mg via ORAL
  Filled 2014-09-21: qty 1

## 2014-09-21 MED ORDER — MAGNESIUM CHLORIDE 64 MG PO TBEC
2.0000 | DELAYED_RELEASE_TABLET | Freq: Two times a day (BID) | ORAL | Status: DC
  Administered 2014-09-21 – 2014-09-23 (×4): 128 mg via ORAL
  Filled 2014-09-21 (×5): qty 2

## 2014-09-21 MED ORDER — LEVOFLOXACIN 750 MG PO TABS
750.0000 mg | ORAL_TABLET | Freq: Once | ORAL | Status: AC
  Administered 2014-09-21: 750 mg via ORAL
  Filled 2014-09-21: qty 1

## 2014-09-21 NOTE — Progress Notes (Signed)
Subjective:   Day of hospitalization: 4  Patient not reporting any complaints this morning. Patient states that she is feeling back to her baseline. Patient is doing well without any supplemental oxygen. She denies any significant cough or productive cough. Patient denies any dysuria or urinary hesitancy upon initial presentation or currently. Patient states that she continues to have diarrhea with around 3 episodes of loose bowel movements over the last day.   Objective:   Vital signs in last 24 hours: Filed Vitals:   09/20/14 1550 09/20/14 1950 09/20/14 2325 09/21/14 0400  BP: 119/87 125/89 138/93 107/73  Pulse:  106 105 106  Temp:  99 F (37.2 C) 99 F (37.2 C) 98.9 F (37.2 C)  TempSrc:  Oral    Resp: 21 26 21 23   Height:      Weight:    88 lb (39.917 kg)  SpO2:    95%    Weight: Filed Weights   09/19/14 0500 09/20/14 0430 09/21/14 0400  Weight: 88 lb 14.4 oz (40.325 kg) 89 lb (40.37 kg) 88 lb (39.917 kg)    I/Os:  Intake/Output Summary (Last 24 hours) at 09/21/14 0713 Last data filed at 09/20/14 1400  Gross per 24 hour  Intake    970 ml  Output      0 ml  Net    970 ml    Physical Exam: Constitutional: Vital signs reviewed.  Sitting up in bed in no acute distress. HEENT: [place]/AT; EOMI, conjunctivae normal, no scleral icterus  Cardiovascular: RRR, no MRG Pulmonary/Chest: normal respiratory effort, no accessory muscle use, CTAB, no wheezes, rales, or rhonchi Abdominal: Soft. +BS, diffuse tenderness mainly suprapubic, lower abd, without rebound or guarding  Neurological: A&O x3, CN II-XII grossly intact; moving all extremities  Extremities: 2+DP b/l, no C/C/E  Skin: Warm, dry and intact.   Lab Results:  BMP:  Recent Labs Lab 09/19/14 0845  09/20/14 1832 09/21/14 0547  NA 136  < > 137 138  K 3.1*  < > 3.7 4.0  CL 110  < > 104 107  CO2 12*  < > 19 19  GLUCOSE 84  < > 170* 108*  BUN 28*  < > 24* 23  CREATININE 3.78*  < > 3.39* 3.46*  CALCIUM 5.5*   < > 4.5* 4.9*  MG 1.9  --   --  1.1*  PHOS  --   < > 3.3 3.2  < > = values in this interval not displayed.  CBC:  Recent Labs Lab 09/19/14 0140 09/19/14 0845 09/20/14 0446 09/21/14 0547  WBC 16.4* 14.4* 7.7 7.6  NEUTROABS 12.5* 11.4*  --   --   HGB 8.2* 9.4* 8.1* 8.9*  HCT 24.6* 27.9* 23.6* 26.5*  MCV 85.7 85.8 84.6 86.9  PLT 43* 49* 45* 49*    Coagulation:  Recent Labs Lab 09/18/14 1110 09/18/14 1705 09/19/14 0845 09/20/14 0446  LABPROT 33.9* 22.9* 19.1* 19.5*  INR 3.31* 2.00* 1.59* 1.63*   LFTs:  Recent Labs Lab 09/17/14 1748  09/20/14 1832 09/21/14 0547  AST 44*  --   --   --   ALT 8  --   --   --   ALKPHOS 234*  --   --   --   BILITOT 0.5  --   --   --   PROT 7.5  --   --   --   ALBUMIN 1.1*  < > <1.0* <1.0*  < > = values in this interval  not displayed.  Pancreatic Enzymes:  Recent Labs Lab 09/17/14 1748  LIPASE 13    Lactic Acid/Procalcitonin:  Recent Labs Lab 09/18/14 1050 09/18/14 1705 09/19/14 0845 09/19/14 1400  LATICACIDVEN 2.4* 2.9* 1.5  --   PROCALCITON  --   --   --  3.38    Ammonia: No results for input(s): AMMONIA in the last 168 hours.  Cardiac Enzymes:  Recent Labs Lab 09/18/14 0035 09/18/14 0812 09/19/14 0845  TROPONINI 0.06* 0.06* 0.06*    EKG: EKG Interpretation  Date/Time:  Thursday September 17 2014 16:56:33 EDT Ventricular Rate:  127 PR Interval:    QRS Duration: 62 QT Interval:  388 QTC Calculation: 563 R Axis:   54 Text Interpretation:  Critical Test Result: Long QTc Accelerated Junctional rhythm Low voltage QRS Cannot rule out Anteroseptal infarct , age undetermined Otherwise no significant change Confirmed by HARRISON  MD, FORREST ([id number]) on 09/17/2014 5:07:17 PM   BNP: No results for input(s): PROBNP in the last 168 hours.  D-Dimer:  Recent Labs Lab 09/18/14 1110  DDIMER 3.09*    Urinalysis:  Recent Labs Lab 09/17/14 1849  COLORURINE AMBER*  LABSPEC 1.020  PHURINE 5.5  GLUCOSEU  NEGATIVE  HGBUR MODERATE*  BILIRUBINUR SMALL*  KETONESUR NEGATIVE  PROTEINUR >300*  UROBILINOGEN 0.2  NITRITE NEGATIVE  LEUKOCYTESUR NEGATIVE    Micro Results: Recent Results (from the past 240 hour(s))  Urine culture     Status: None (Preliminary result)   Collection Time: 09/17/14  6:49 PM  Result Value Ref Range Status   Specimen Description URINE, CLEAN CATCH  Final   Special Requests Normal  Final   Colony Count   Final    >=100,000 COLONIES/ML Performed at [facility]    Culture   Final    [place] Performed at [facility]    Report Status PENDING  Incomplete  MRSA PCR Screening     Status: Abnormal   Collection Time: 09/17/14  9:31 PM  Result Value Ref Range Status   MRSA by PCR POSITIVE (A) NEGATIVE Final    Comment:        The GeneXpert MRSA Assay (FDA approved for NASAL specimens only), is one component of a comprehensive MRSA colonization surveillance program. It is not intended to diagnose MRSA infection nor to guide or monitor treatment for MRSA infections. RESULT CALLED TO, READ BACK BY AND VERIFIED WITH: S.COOPER,RN AT 0030 BY L.PITT 09/18/14   Culture, blood (routine x 2)     Status: None (Preliminary result)   Collection Time: 09/18/14 12:35 AM  Result Value Ref Range Status   Specimen Description BLOOD RIGHT HAND  Final   Special Requests BOTTLES DRAWN AEROBIC AND ANAEROBIC 3CC  Final   Culture   Final           BLOOD CULTURE RECEIVED NO GROWTH TO DATE CULTURE WILL BE HELD FOR 5 DAYS BEFORE ISSUING A FINAL NEGATIVE REPORT Performed at [facility]    Report Status PENDING  Incomplete  Clostridium Difficile by PCR     Status: None   Collection Time: 09/19/14  6:08 PM  Result Value Ref Range Status   C difficile by pcr NEGATIVE NEGATIVE Final    Blood Culture:    Component Value Date/Time   SDES BLOOD RIGHT HAND 09/18/2014 0035   SPECREQUEST BOTTLES DRAWN AEROBIC AND ANAEROBIC 3CC 09/18/2014 0035    CULT  09/18/2014 0035           BLOOD CULTURE  RECEIVED NO GROWTH TO DATE CULTURE WILL BE HELD FOR 5 DAYS BEFORE ISSUING A FINAL NEGATIVE REPORT Performed at [place] PENDING 09/18/2014 0035    Studies/Results: Dg Chest 2 View  09/19/2014   CLINICAL DATA:  Shortness of breath for 5 days  EXAM: CHEST  2 VIEW  COMPARISON:  09/17/2014  FINDINGS: Cardiac shadow is stable. Bibasilar lower lobe infiltrates are seen. These are new from the prior exam. No acute bony abnormality is noted.  IMPRESSION: Bilateral lower lobe infiltrates.   Electronically Signed   By: Inez Catalina M.D.   On: 09/19/2014 11:07    Medications:  Scheduled Meds: . sodium chloride   Intravenous Once  . calcitRIOL  0.25 mcg Oral Daily  . calcium carbonate  1 tablet Oral BID WC  . ceFEPime (MAXIPIME) IV  500 mg Intravenous Q24H  . Chlorhexidine Gluconate Cloth  6 each Topical Q0600  . feeding supplement (PRO-STAT SUGAR FREE 64)  30 mL Oral TID WC  . folic acid  1 mg Oral Daily  . lipase/protease/amylase  12,000 Units Oral TID AC  . magnesium chloride  2 tablet Oral Daily  . multivitamin with minerals  1 tablet Oral Daily  . mupirocin ointment  1 application Nasal BID  . potassium chloride  40 mEq Oral BID  . sodium bicarbonate  650 mg Oral BID  . sodium chloride  3 mL Intravenous Q12H  . thiamine  100 mg Oral Daily   Or  . thiamine  100 mg Intravenous Daily  . vancomycin  500 mg Intravenous Q48H   Continuous Infusions:   PRN Meds:   Antibiotics: Antibiotics Given (last 72 hours)    Date/Time Action Medication Dose Rate   09/18/14 1025 Given   vancomycin (VANCOCIN) IVPB 750 mg/150 ml premix 750 mg 150 mL/hr   09/18/14 1240 Given   piperacillin-tazobactam (ZOSYN) IVPB 3.375 g 3.375 g 100 mL/hr   09/18/14 1525 Given   piperacillin-tazobactam (ZOSYN) IVPB 2.25 g 2.25 g 100 mL/hr   09/19/14 0021 Given   piperacillin-tazobactam (ZOSYN) IVPB 2.25 g 2.25 g 100 mL/hr   09/19/14 3825 Given     piperacillin-tazobactam (ZOSYN) IVPB 2.25 g 2.25 g 100 mL/hr   09/19/14 1346 Given   piperacillin-tazobactam (ZOSYN) IVPB 2.25 g 2.25 g 100 mL/hr   09/19/14 2245 Given   piperacillin-tazobactam (ZOSYN) IVPB 2.25 g 2.25 g 100 mL/hr   09/20/14 0529 Given   piperacillin-tazobactam (ZOSYN) IVPB 2.25 g 2.25 g 100 mL/hr   09/20/14 0954 Given   vancomycin (VANCOCIN) 500 mg in sodium chloride 0.9 % 100 mL IVPB 500 mg 100 mL/hr   09/20/14 1640 Given  [only one IV access]   ceFEPIme (MAXIPIME) 500 mg in dextrose 5 % 50 mL IVPB 500 mg 100 mL/hr      Day of Hospitalization: 4  Consults: Treatment Team:  Donato Heinz, MD  Assessment/Plan:   Principal Problem:   SOB (shortness of breath) Active Problems:   Normochromic normocytic anemia   Alcohol abuse   Depression   Hypertension, renal disease   Seizure disorder   Severe protein-calorie malnutrition   FSGS (focal segmental glomerulosclerosis)   Increased anion gap metabolic acidosis   Systolic and diastolic CHF, chronic   Anemia of chronic disease   AKI (acute kidney injury)   Leukocytosis   Chest pain   Thrombocytopenia   Elevated INR   Elevated troponin   Electrolyte abnormality  HCAP: Patient is much improved now satting well  on room air. Patient was initially started on vancomycin and Zosyn for sepsis (09/18/2014). Repeat chest x-ray from yesterday showing bilateral lower lobe infiltrates. Patient's white count has normalized. -Consider de-escalation of antibiotics to either a respiratory fluoroquinolone or Augmentin. -Blood cultures no growth to date  Electrolyte abnormalities: Patient initially presenting with hypomagnesemia, hypocalcemia, and hypokalemia all likely secondary to her chronic alcohol use in addition to diarrhea. Nephrology has been following. -Appreciate nephrology recommendations -Continue magnesium supplementation with magnesium chloride. -Potassium has normalized at 4.0 this morning. -Patient  status post 1 g of calcium gluconate overnight, consider repeat ionized calcium to follow-up response.  Acute on chronic kidney disease: Creatinine has stabilized at 3.46. -Continue sodium bicarbonate 650 mg twice a day by mouth  Metabolic acidosis: Bicarbonate 19 today with no elevated anion gap. -Continue to replete with sodium bicarbonate as above.  Elevated INR: Unclear etiology upon initial presentation. INR has continued to trend down to within a more normal range.  Thrombocytopenia: Platelets of 49 stable throughout this admission.  -Will hold on pharmacological prophylaxis for DVT.  Elevated INR Unclear etiology.  Trending down.  Lab Results  Component Value Date   INR 1.63* 09/20/2014   INR 1.59* 09/19/2014   INR 2.00* 09/18/2014  -cont to monitor  ?UTI: Patient not reporting any dysuria or suprapubic tenderness upon interview this morning. Patient initially presented with suprapubic tenderness the setting of greater than 100,000 gram-positive cocci.  -Consider continue coverage with de-escalation of antibiotics as discussed above.  Diet: Heart healthy Prophylaxis: SCDs given profound thrombus cytopenia Disposition Disposition is deferred, awaiting improvement of current medical problems.  Anticipated discharge in approximately 0-1 day(s).   -Patient will need home health PT   LOS: 4 days   Luan Moore, MD PGY-1, Internal Medicine Teaching Service 09/21/2014, 7:13 AM

## 2014-09-21 NOTE — Progress Notes (Signed)
Patient ID: STUTI SANDIN, female   DOB: 1955/08/07, 59 y.o.   MRN: [id number] S:No new complaints O:BP 107/73 mmHg  Pulse 106  Temp(Src) 98.9 F (37.2 C) (Oral)  Resp 23  Ht 5\' 1"  (1.549 m)  Wt 39.917 kg (88 lb)  BMI 16.64 kg/m2  SpO2 95%  Intake/Output Summary (Last 24 hours) at 09/21/14 1237 Last data filed at 09/21/14 0824  Gross per 24 hour  Intake    500 ml  Output      0 ml  Net    500 ml   Intake/Output: I/O last 3 completed shifts: In: 42 [P.O.:360; IV Piggyback:610] Out: 200 [Urine:200]  Intake/Output this shift:  Total I/O In: 260 [P.O.:260] Out: -  Weight change: -0.454 kg (-1 lb) Gen:WD AAF who appears older than stated age in NAD CVS:no rub Resp:cta IOE:VOJJKK Ext:no edema   Recent Labs Lab 09/17/14 1748  09/18/14 0630 09/18/14 1705 09/19/14 0140 09/19/14 0845 09/20/14 0446 09/20/14 1832 09/21/14 0547  NA 132*  --  134* 135 138 136 137 137 138  K 2.5*  --  3.3* 2.9* 3.2* 3.1* 2.5* 3.7 4.0  CL 109  --  112 112 112 110 102 104 107  CO2 6*  --  8* 11* 11* 12* 21 19 19   GLUCOSE 174*  --  135* 111* 110* 84 97 170* 108*  BUN 23  --  26* 27* 27* 28* 22 24* 23  CREATININE 4.14*  --  4.06* 4.12* 4.03* 3.78* 3.32* 3.39* 3.46*  ALBUMIN 1.1*  --   --   --   --   --  <1.0* <1.0* <1.0*  CALCIUM <4.0*  < > 5.0* 4.9* 4.8* 5.5* 4.6* 4.5* 4.9*  PHOS 2.5  --  2.5  --   --   --  <1.0* 3.3 3.2  AST 44*  --   --   --   --   --   --   --   --   ALT 8  --   --   --   --   --   --   --   --   < > = values in this interval not displayed. Liver Function Tests:  Recent Labs Lab 09/17/14 1748 09/20/14 0446 09/20/14 1832 09/21/14 0547  AST 44*  --   --   --   ALT 8  --   --   --   ALKPHOS 234*  --   --   --   BILITOT 0.5  --   --   --   PROT 7.5  --   --   --   ALBUMIN 1.1* <1.0* <1.0* <1.0*    Recent Labs Lab 09/17/14 1748  LIPASE 13   No results for input(s): AMMONIA in the last 168 hours. CBC:  Recent Labs Lab 09/18/14 0630  09/19/14 0140  09/19/14 0845 09/20/14 0446 09/21/14 0547  WBC 31.8*  --  16.4* 14.4* 7.7 7.6  NEUTROABS 25.5*  --  12.5* 11.4*  --   --   HGB 5.4*  --  8.2* 9.4* 8.1* 8.9*  HCT 16.1*  --  24.6* 27.9* 23.6* 26.5*  MCV 93.1  --  85.7 85.8 84.6 86.9  PLT 50*  < > 43* 49* 45* 49*  < > = values in this interval not displayed. Cardiac Enzymes:  Recent Labs Lab 09/18/14 0035 09/18/14 0812 09/19/14 0845  TROPONINI 0.06* 0.06* 0.06*   CBG: No results for input(s): GLUCAP in the  last 168 hours.  Iron Studies: No results for input(s): IRON, TIBC, TRANSFERRIN, FERRITIN in the last 72 hours. Studies/Results: No results found. . sodium chloride   Intravenous Once  . calcitRIOL  0.25 mcg Oral Daily  . calcium carbonate  1 tablet Oral BID WC  . Chlorhexidine Gluconate Cloth  6 each Topical Q0600  . feeding supplement (PRO-STAT SUGAR FREE 64)  30 mL Oral TID WC  . folic acid  1 mg Oral Daily  . [START ON 09/23/2014] levofloxacin  500 mg Oral Q48H  . lipase/protease/amylase  12,000 Units Oral TID AC  . magnesium chloride  2 tablet Oral Daily  . multivitamin with minerals  1 tablet Oral Daily  . mupirocin ointment  1 application Nasal BID  . potassium chloride  40 mEq Oral BID  . sodium bicarbonate  650 mg Oral BID  . sodium chloride  3 mL Intravenous Q12H  . thiamine  100 mg Oral Daily    BMET    Component Value Date/Time   NA 138 09/21/2014 0547   K 4.0 09/21/2014 0547   CL 107 09/21/2014 0547   CO2 19 09/21/2014 0547   GLUCOSE 108* 09/21/2014 0547   BUN 23 09/21/2014 0547   CREATININE 3.46* 09/21/2014 0547   CREATININE 2.02* 06/18/2014 1620   CALCIUM 4.9* 09/21/2014 0547   CALCIUM 4.0* 09/17/2014 2048   GFRNONAA 14* 09/21/2014 0547   GFRNONAA 27* 06/18/2014 1620   GFRAA 16* 09/21/2014 0547   GFRAA 31* 06/18/2014 1620   CBC    Component Value Date/Time   WBC 7.6 09/21/2014 0547   WBC 7.2 10/26/2011 1336   RBC 3.05* 09/21/2014 0547   RBC 2.54* 06/18/2014 1620   RBC 2.98* 10/26/2011  1336   HGB 8.9* 09/21/2014 0547   HGB 9.7* 10/26/2011 1336   HCT 26.5* 09/21/2014 0547   HCT 28.8* 10/26/2011 1336   PLT 49* 09/21/2014 0547   PLT 125* 10/26/2011 1336   MCV 86.9 09/21/2014 0547   MCV 96.6 10/26/2011 1336   MCH 29.2 09/21/2014 0547   MCH 32.6 10/26/2011 1336   MCHC 33.6 09/21/2014 0547   MCHC 33.7 10/26/2011 1336   RDW 18.7* 09/21/2014 0547   RDW 13.6 10/26/2011 1336   LYMPHSABS 1.5 09/19/2014 0845   LYMPHSABS 2.7 10/26/2011 1336   MONOABS 1.4* 09/19/2014 0845   MONOABS 0.8 10/26/2011 1336   EOSABS 0.0 09/19/2014 0845   EOSABS 0.1 10/26/2011 1336   BASOSABS 0.0 09/19/2014 0845   BASOSABS 0.0 10/26/2011 1336    Assessment/Plan:  1. AKI/CKD- presumably due to ischemic ATN in setting of diarrhea/hypovolemia with underlying SLE membranous and global sclerosis.  Improving with IVF's. Continue to follow 2. Metabolic acidosis- improved with bicarb 3. Anemia and thrombocytopenia- combo of SLE and Etoh abuse as well as CKD. S/p blood transfusion and FFP.  Will start aranesp 4. SLE- does not appear to be active at this time 5. Protein malnutrition 6. Etoh abuse- makes it difficult for more aggressive therapy due to nonadherence and ongoing etoh use. 7. Thrombocytopenia- combo of Etoh and SLE  Anjalee Cope A

## 2014-09-21 NOTE — Progress Notes (Signed)
  PROGRESS NOTE MEDICINE TEACHING ATTENDING   Day 4 of stay Patient name: [name] [name]   Medical record number: [id number] Date of birth: 02-01-1956   Met with the patient this morning with team. She feels fatigued ans has ongoing diarrhea.   Blood pressure 107/73, pulse 106, temperature 98.9 F (37.2 C), temperature source Oral, resp. rate 23, height $RemoveBe'5\' 1"'[id number]$  (1.549 m), weight 88 lb (39.917 kg), SpO2 95 %. I examined the patient, she is alert and oriented, comfortable, in no acute distress. PERRL, EOMI. She appears fragile, cachectic and fatigued. Heart exhibits regular rate and rhythm, no murmurs. Lungs are clear to auscultation. Abdomen is soft and non-tender. There is no pedal edema and good pedal pulses. There are no gross focal neurological deficits apparent.   Assessment  HCAP - improving, ok to change to levofloxacin oral today.   Diarrhea - unsure of etiology, patient usually has diarrhea because of magOx supplementation, but she is complaining of having diarrhea 10 times a day this hospital admission. She is CDiff negative. We will hydrate her, given her probiotic, and offer supportive therapy.  Hypomagnesemia - Mag Ox is a choice for her because she has CKD, however it causes diarrhea. We can put her on Mg Chloride, however renal impairment is a contraindication for MgCl. However, on further reading and as per Dr Trudee Kuster discussion with pharmacy, the concern is mostly when the parenteral product is administered as it has aluminium and can cause toxicity. Oral preparations - more risk is magnesium toxicity in CKD patients and this patient has very low magnesium levels, thus I do not think she will have toxicity if given Mg Chloride for a short time. She will be reassessed in her clinic follow up and reverted to Mag Ox if her diarrhea has subsided.   Hypokalemia- Oral supplementation as needed.      Thrombocytopenia - stable, likely related to alcohol abuse.   Normocytic Anemia - She had  GI work up last year with colonoscopy - has internal and external hemorroids - she denies seeing frank blood in stool at this time. Stable levels >8 today which is her baseline. Continue to monitor. Gi referral as outpatient if levels continue to drop.    I have discussed the care of this patient with my IM team residents - Dr Raelene Bott. Please see the resident note for details.  [place], [place] 09/21/2014, 1:57 PM.

## 2014-09-22 ENCOUNTER — Other Ambulatory Visit: Payer: Self-pay | Admitting: *Deleted

## 2014-09-22 LAB — RENAL FUNCTION PANEL
Albumin: <1 g/dL — ABNORMAL LOW (ref 3.5–5.2)
Anion gap: 11 (ref 5–15)
BUN: 25 mg/dL — ABNORMAL HIGH (ref 6–23)
CHLORIDE: 110 mmol/L (ref 96–112)
CO2: 18 mmol/L — ABNORMAL LOW (ref 19–32)
Calcium: 5 mg/dL — CL (ref 8.4–10.5)
Creatinine, Ser: 3.37 mg/dL — ABNORMAL HIGH (ref 0.50–1.10)
GFR calc Af Amer: 16 mL/min — ABNORMAL LOW (ref >90)
GFR, EST NON AFRICAN AMERICAN: 14 mL/min — AB (ref >90)
Glucose, Bld: 71 mg/dL (ref 70–99)
Phosphorus: 3 mg/dL (ref 2.3–4.6)
Potassium: 5.1 mmol/L (ref 3.5–5.1)
SODIUM: 139 mmol/L (ref 135–145)

## 2014-09-22 LAB — CBC
HCT: 28.9 % — ABNORMAL LOW (ref 36.0–46.0)
HEMOGLOBIN: 9.4 g/dL — AB (ref 12.0–15.0)
MCH: 29 pg (ref 26.0–34.0)
MCHC: 32.5 g/dL (ref 30.0–36.0)
MCV: 89.2 fL (ref 78.0–100.0)
Platelets: 74 10*3/uL — ABNORMAL LOW (ref 150–400)
RBC: 3.24 MIL/uL — ABNORMAL LOW (ref 3.87–5.11)
RDW: 18.9 % — ABNORMAL HIGH (ref 11.5–15.5)
WBC: 6.5 10*3/uL (ref 4.0–10.5)

## 2014-09-22 LAB — GI PATHOGEN PANEL BY PCR, STOOL
C difficile toxin A/B: NOT DETECTED
CRYPTOSPORIDIUM BY PCR: NOT DETECTED
Campylobacter by PCR: NOT DETECTED
E COLI (ETEC) LT/ST: NOT DETECTED
E COLI (STEC): NOT DETECTED
E COLI 0157 BY PCR: NOT DETECTED
G lamblia by PCR: NOT DETECTED
NOROVIRUS G1/G2: NOT DETECTED
Rotavirus A by PCR: NOT DETECTED
Salmonella by PCR: NOT DETECTED
Shigella by PCR: NOT DETECTED

## 2014-09-22 LAB — MAGNESIUM: MAGNESIUM: 1 mg/dL — AB (ref 1.5–2.5)

## 2014-09-22 LAB — CALCIUM, IONIZED: Calcium, Ion: 0.7 mmol/L — ABNORMAL LOW (ref 1.12–1.32)

## 2014-09-22 LAB — VITAMIN D 1,25 DIHYDROXY
Vitamin D 1, 25 (OH)2 Total: 13 pg/mL
Vitamin D2 1, 25 (OH)2: <10 pg/mL
Vitamin D3 1, 25 (OH)2: <10 pg/mL

## 2014-09-22 MED ORDER — VITAMIN B-1 100 MG PO TABS: 100.0000 mg | ORAL_TABLET | Freq: Every day | ORAL | Status: DC

## 2014-09-22 MED ORDER — BACID PO TABS
2.0000 | ORAL_TABLET | Freq: Three times a day (TID) | ORAL | Status: DC
  Administered 2014-09-22 – 2014-09-23 (×3): 2 via ORAL
  Filled 2014-09-22 (×5): qty 2

## 2014-09-22 MED ORDER — DIPHENOXYLATE-ATROPINE 2.5-0.025 MG PO TABS
2.0000 | ORAL_TABLET | Freq: Four times a day (QID) | ORAL | Status: DC | PRN
  Administered 2014-09-22: 2 via ORAL
  Filled 2014-09-22: qty 2

## 2014-09-22 MED ORDER — CALCIUM CARBONATE ANTACID 500 MG PO CHEW
6.0000 | CHEWABLE_TABLET | Freq: Three times a day (TID) | ORAL | Status: DC
  Administered 2014-09-22 – 2014-09-23 (×3): 1200 mg via ORAL
  Filled 2014-09-22 (×3): qty 3

## 2014-09-22 MED ORDER — POTASSIUM CHLORIDE CRYS ER 20 MEQ PO TBCR: 40.0000 meq | EXTENDED_RELEASE_TABLET | Freq: Two times a day (BID) | ORAL | Status: DC

## 2014-09-22 MED ORDER — VITAMIN D (ERGOCALCIFEROL) 1.25 MG (50000 UNIT) PO CAPS: 50000.0000 [IU] | ORAL_CAPSULE | ORAL | Status: DC

## 2014-09-22 NOTE — Progress Notes (Signed)
  PROGRESS NOTE MEDICINE TEACHING ATTENDING   Day 5 of stay Patient name: [name] [name]   Medical record number: [id number] Date of birth: May 29, 1956    Patient continues to have diarrhea. Her breathing is back to baseline per her report. She feels exhausted due to the diarrhea.   Blood pressure 138/94, pulse 91, temperature 97.9 F (36.6 C), temperature source Oral, resp. rate 19, height 5\' 1"  (1.549 m), weight 92 lb 6.4 oz (41.912 kg), SpO2 98 %. The patient is alert and oriented, comfortable, in no acute distress, cachectic. PERRL, EOMI. Heart exhibits regular rate and rhythm, no murmurs. Lungs are clear to auscultation. Abdomen is soft and non-tender. There is no pedal edema and good pedal pulses. There are no gross focal neurological deficits apparent.   Assessment  HCAP - oral levaquin, tolerating well.   Diarrhea - CDiff negative. May be antibiotic associated, may be from magnesium. Start supportive therapy with immodium, and probiotic.   Electrolyte abnormalities - Agree with replacement per Dr Trudee Kuster note. Appreciate renal recommendations.   AKI on CKD - improving.    Thrombocytopenia - slow improvement.  Discharge - pending improvement in diarrhea I have discussed the care of this patient with my IM team residents. Please see the resident note for details.  [place], [place] 09/22/2014, 3:50 PM.

## 2014-09-22 NOTE — Progress Notes (Signed)
Subjective:   Day of hospitalization: 5   Patient reporting that she has continued to have episodes of diarrhea over the last day. Patient denies any associated abdominal pain. Patient denies any blood in her stool. Otherwise, patient not reporting any complaints. She states that her breathing is back to her baseline.  Objective:   Vital signs in last 24 hours: Filed Vitals:   09/22/14 0400 09/22/14 0405 09/22/14 0500 09/22/14 0700  BP: 109/73 130/85 109/73 138/94  Pulse: 111   91  Temp: 97.9 F (36.6 C)     TempSrc:    Oral  Resp: 19 15  19   Height:      Weight: 92 lb 6.4 oz (41.912 kg)     SpO2: 98%       Weight: Filed Weights   09/20/14 0430 09/21/14 0400 09/22/14 0400  Weight: 89 lb (40.37 kg) 88 lb (39.917 kg) 92 lb 6.4 oz (41.912 kg)    I/Os:  Intake/Output Summary (Last 24 hours) at 09/22/14 1507 Last data filed at 09/22/14 1249  Gross per 24 hour  Intake   1005 ml  Output      0 ml  Net   1005 ml    Physical Exam: Constitutional: Vital signs reviewed.  Sitting up in bed in no acute distress. HEENT: [place]/AT; EOMI, conjunctivae normal, no scleral icterus  Cardiovascular: RRR, no MRG Pulmonary/Chest: normal respiratory effort, no accessory muscle use, CTAB, no wheezes, rales, or rhonchi Abdominal: soft, nontender, nondistended, positive bowel signs in all 4 quadrants Neurological: A&O x3, CN II-XII grossly intact; moving all extremities  Extremities: 2+DP b/l, no C/C/E  Skin: Warm, dry and intact.   Lab Results:  BMP:  Recent Labs Lab 09/21/14 0547 09/22/14 0424  NA 138 139  K 4.0 5.1  CL 107 110  CO2 19 18*  GLUCOSE 108* 71  BUN 23 25*  CREATININE 3.46* 3.37*  CALCIUM 4.9* 5.0*  MG 1.1* 1.0*  PHOS 3.2 3.0    CBC:  Recent Labs Lab 09/19/14 0140 09/19/14 0845  09/21/14 0547 09/22/14 0424  WBC 16.4* 14.4*  < > 7.6 6.5  NEUTROABS 12.5* 11.4*  --   --   --   HGB 8.2* 9.4*  < > 8.9* 9.4*  HCT 24.6* 27.9*  < > 26.5* 28.9*  MCV 85.7  85.8  < > 86.9 89.2  PLT 43* 49*  < > 49* 74*  < > = values in this interval not displayed.  Coagulation:  Recent Labs Lab 09/18/14 1110 09/18/14 1705 09/19/14 0845 09/20/14 0446  LABPROT 33.9* 22.9* 19.1* 19.5*  INR 3.31* 2.00* 1.59* 1.63*   LFTs:  Recent Labs Lab 09/17/14 1748  09/21/14 0547 09/22/14 0424  AST 44*  --   --   --   ALT 8  --   --   --   ALKPHOS 234*  --   --   --   BILITOT 0.5  --   --   --   PROT 7.5  --   --   --   ALBUMIN 1.1*  < > <1.0* <1.0*  < > = values in this interval not displayed.  Pancreatic Enzymes:  Recent Labs Lab 09/17/14 1748  LIPASE 13    Lactic Acid/Procalcitonin:  Recent Labs Lab 09/18/14 1050 09/18/14 1705 09/19/14 0845 09/19/14 1400  LATICACIDVEN 2.4* 2.9* 1.5  --   PROCALCITON  --   --   --  3.38    Ammonia: No results for  input(s): AMMONIA in the last 168 hours.  Cardiac Enzymes:  Recent Labs Lab 09/18/14 0035 09/18/14 0812 09/19/14 0845  TROPONINI 0.06* 0.06* 0.06*    EKG: EKG Interpretation  Date/Time:  Thursday September 17 2014 16:56:33 EDT Ventricular Rate:  127 PR Interval:    QRS Duration: 62 QT Interval:  388 QTC Calculation: 563 R Axis:   54 Text Interpretation:  Critical Test Result: Long QTc Accelerated Junctional rhythm Low voltage QRS Cannot rule out Anteroseptal infarct , age undetermined Otherwise no significant change Confirmed by HARRISON  MD, FORREST ([id number]) on 09/17/2014 5:07:17 PM   BNP: No results for input(s): PROBNP in the last 168 hours.  D-Dimer:  Recent Labs Lab 09/18/14 1110  DDIMER 3.09*    Urinalysis:  Recent Labs Lab 09/17/14 1849  COLORURINE AMBER*  LABSPEC 1.020  PHURINE 5.5  GLUCOSEU NEGATIVE  HGBUR MODERATE*  BILIRUBINUR SMALL*  KETONESUR NEGATIVE  PROTEINUR >300*  UROBILINOGEN 0.2  NITRITE NEGATIVE  LEUKOCYTESUR NEGATIVE    Micro Results: Recent Results (from the past 240 hour(s))  Urine culture     Status: None   Collection Time: 09/17/14   6:49 PM  Result Value Ref Range Status   Specimen Description URINE, CLEAN CATCH  Final   Special Requests Normal  Final   Colony Count   Final    >=100,000 COLONIES/ML Performed at [facility]    Culture   Final    ENTEROCOCCUS SPECIES Performed at [facility]    Report Status 09/21/2014 FINAL  Final   Organism ID, Bacteria ENTEROCOCCUS SPECIES  Final      Susceptibility   Enterococcus species - MIC*    AMPICILLIN >=32 RESISTANT Resistant     LEVOFLOXACIN >=8 RESISTANT Resistant     NITROFURANTOIN 64 INTERMEDIATE Intermediate     VANCOMYCIN <=0.5 SENSITIVE Sensitive     TETRACYCLINE >=16 RESISTANT Resistant     * ENTEROCOCCUS SPECIES  MRSA PCR Screening     Status: Abnormal   Collection Time: 09/17/14  9:31 PM  Result Value Ref Range Status   MRSA by PCR POSITIVE (A) NEGATIVE Final    Comment:        The GeneXpert MRSA Assay (FDA approved for NASAL specimens only), is one component of a comprehensive MRSA colonization surveillance program. It is not intended to diagnose MRSA infection nor to guide or monitor treatment for MRSA infections. RESULT CALLED TO, READ BACK BY AND VERIFIED WITH: S.COOPER,RN AT 0030 BY L.PITT 09/18/14   Culture, blood (routine x 2)     Status: None (Preliminary result)   Collection Time: 09/18/14 12:35 AM  Result Value Ref Range Status   Specimen Description BLOOD RIGHT HAND  Final   Special Requests BOTTLES DRAWN AEROBIC AND ANAEROBIC 3CC  Final   Culture   Final           BLOOD CULTURE RECEIVED NO GROWTH TO DATE CULTURE WILL BE HELD FOR 5 DAYS BEFORE ISSUING A FINAL NEGATIVE REPORT Performed at [facility]    Report Status PENDING  Incomplete  Clostridium Difficile by PCR     Status: None   Collection Time: 09/19/14  6:08 PM  Result Value Ref Range Status   C difficile by pcr NEGATIVE NEGATIVE Final    Blood Culture:    Component Value Date/Time   SDES BLOOD RIGHT HAND 09/18/2014 0035   SPECREQUEST  BOTTLES DRAWN AEROBIC AND ANAEROBIC 3CC 09/18/2014 0035   CULT  09/18/2014 0035  BLOOD CULTURE RECEIVED NO GROWTH TO DATE CULTURE WILL BE HELD FOR 5 DAYS BEFORE ISSUING A FINAL NEGATIVE REPORT Performed at [facility] PENDING 09/18/2014 0035    Studies/Results: No results found.  Medications:  Scheduled Meds: . sodium chloride   Intravenous Once  . calcitRIOL  0.25 mcg Oral Daily  . calcium carbonate  1 tablet Oral BID WC  . calcium carbonate  6 tablet Oral TID  . Chlorhexidine Gluconate Cloth  6 each Topical Q0600  . feeding supplement (PRO-STAT SUGAR FREE 64)  30 mL Oral TID WC  . folic acid  1 mg Oral Daily  . [START ON 09/23/2014] levofloxacin  500 mg Oral Q48H  . lipase/protease/amylase  12,000 Units Oral TID AC  . magnesium chloride  2 tablet Oral BID  . multivitamin with minerals  1 tablet Oral Daily  . mupirocin ointment  1 application Nasal BID  . sodium bicarbonate  650 mg Oral BID  . sodium chloride  3 mL Intravenous Q12H  . thiamine  100 mg Oral Daily   Continuous Infusions:   PRN Meds:   Antibiotics: Antibiotics Given (last 72 hours)    Date/Time Action Medication Dose Rate   09/19/14 2245 Given   piperacillin-tazobactam (ZOSYN) IVPB 2.25 g 2.25 g 100 mL/hr   09/20/14 0529 Given   piperacillin-tazobactam (ZOSYN) IVPB 2.25 g 2.25 g 100 mL/hr   09/20/14 0954 Given   vancomycin (VANCOCIN) 500 mg in sodium chloride 0.9 % 100 mL IVPB 500 mg 100 mL/hr   09/20/14 1640 Given  [only one IV access]   ceFEPIme (MAXIPIME) 500 mg in dextrose 5 % 50 mL IVPB 500 mg 100 mL/hr   09/21/14 1234 Given   levofloxacin (LEVAQUIN) tablet 750 mg 750 mg       Day of Hospitalization: 5  Consults: Treatment Team:  Donato Heinz, MD  Assessment/Plan:   Principal Problem:   SOB (shortness of breath) Active Problems:   Normochromic normocytic anemia   Alcohol abuse   Depression   Hypertension, renal disease   Seizure disorder    Severe protein-calorie malnutrition   FSGS (focal segmental glomerulosclerosis)   Increased anion gap metabolic acidosis   Systolic and diastolic CHF, chronic   Anemia of chronic disease   AKI (acute kidney injury)   Leukocytosis   Chest pain   Thrombocytopenia   Elevated INR   Elevated troponin   Electrolyte abnormality  HCAP: Patient continues to do well on room air. Patient without any symptoms of dyspnea or coughing. Patient was initially started on vancomycin and Zosyn for sepsis (09/18/2014). Repeat chest x-ray from yesterday showing bilateral lower lobe infiltrates. Patient's white count has normalized. -continue with Levaquin -Blood cultures no growth to date  Diarrhea: C. Difficile negative. Likely secondary to magnesium supplementation or antibiotic therapy. -Lomotil -Probiotics  Electrolyte abnormalities: Patient initially presenting with hypomagnesemia, hypocalcemia, and hypokalemia all likely secondary to her chronic alcohol use in addition to diarrhea. Ionized calcium remains low at 0.70. Magnesium continues to be low at 1.0. Nephrology has been following. -Appreciate nephrology recommendations -Continue magnesium supplementation with magnesium chloride. -Potassium has normalized -home calcium carbonate and calcitriol supplementation restarted  Acute on chronic kidney disease: creatinine slightly down to 3.37 from 3.46 yesterday. -Continue sodium bicarbonate 650 mg twice a day by mouth  Metabolic acidosis: Bicarbonate stable at 18 with no elevated anion gap. -Continue to replete with sodium bicarbonate as above.  Elevated INR: resolved.  Thrombocytopenia: platelets stable he low at  74. -Will hold on pharmacological prophylaxis for DVT.  ?UTI: patient growinggreater than 100,000 enterococcus. Patient status post several days of vancomycin and is currently no longer symptomatic. No indication to further treat at this point.  Diet: Heart healthy Prophylaxis: SCDs  given profound thrombocytopenia Disposition Disposition is deferred, awaiting improvement of current medical problems.  Anticipated discharge in approximately 0-1 day(s).   -Patient will need home health PT -Gabapentin not to be resumed upon discharge given lack of symptoms and high dosage (per PCP)   LOS: 5 days   Luan Moore, MD PGY-1, Internal Medicine Teaching Service 09/22/2014, 3:07 PM

## 2014-09-22 NOTE — Progress Notes (Signed)
  Pt's calcium 5.0. Md on call made aware. No new orderes received. Will cont to monitor pt.

## 2014-09-22 NOTE — Progress Notes (Signed)
Physical Therapy Treatment Patient Details Name: [name] [name] MRN: [id number] DOB: 1956-02-05 Today's Date: 10/02/2014    History of Present Illness Pt adm with acute respiratory failure and diarrhea. PMH - lupus, ETOH abuse, HTN, CKD, CHF, pericardial window    PT Comments    Pt improving with mobility and is close to baseline.  Follow Up Recommendations  Home health PT;Supervision - Intermittent     Equipment Recommendations  None recommended by PT    Recommendations for Other Services       Precautions / Restrictions Precautions Precautions: None    Mobility  Bed Mobility Overal bed mobility: Modified Independent Bed Mobility: Supine to Sit;Sit to Supine     Supine to sit: Modified independent (Device/Increase time) Sit to supine: Modified independent (Device/Increase time)      Transfers Overall transfer level: Modified independent Equipment used: None Transfers: Sit to/from Stand Sit to Stand: Modified independent (Device/Increase time)            Ambulation/Gait Ambulation/Gait assistance: Min guard Ambulation Distance (Feet): 80 Feet Assistive device: None Gait Pattern/deviations: Step-through pattern;Decreased stride length   Gait velocity interpretation: Below normal speed for age/gender General Gait Details: No loss of balance. Pt hesitant to amb further due to afraid she will need to go to the bathroom urgently.   Stairs            Wheelchair Mobility    Modified Rankin (Stroke Patients Only)       Balance Overall balance assessment: Needs assistance Sitting-balance support: No upper extremity supported;Feet supported Sitting balance-Leahy Scale: Good     Standing balance support: No upper extremity supported Standing balance-Leahy Scale: Good                      Cognition Arousal/Alertness: Awake/alert Behavior During Therapy: WFL for tasks assessed/performed Overall Cognitive Status: Within Functional  Limits for tasks assessed                      Exercises      General Comments        Pertinent Vitals/Pain Pain Assessment: No/denies pain    Home Living                      Prior Function            PT Goals (current goals can now be found in the care plan section) Progress towards PT goals: Progressing toward goals    Frequency       PT Plan Current plan remains appropriate    Co-evaluation             End of Session   Activity Tolerance: Patient tolerated treatment well Patient left: in bed;with call bell/phone within reach     Time: 1410-1424 PT Time Calculation (min) (ACUTE ONLY): 14 min  Charges:  $Gait Training: 8-22 mins                    G Codes:      Demontez Novack 10-02-2014, 3:51 PM  [facility] PT [phone]

## 2014-09-22 NOTE — Progress Notes (Signed)
Patient ID: AARIEL EMS, female   DOB: Sep 10, 1955, 59 y.o.   MRN: [id number] S:No new complaints O:BP 138/94 mmHg  Pulse 91  Temp(Src) 97.9 F (36.6 C) (Oral)  Resp 19  Ht 5\' 1"  (1.549 m)  Wt 41.912 kg (92 lb 6.4 oz)  BMI 17.47 kg/m2  SpO2 98%  Intake/Output Summary (Last 24 hours) at 09/22/14 1331 Last data filed at 09/22/14 1249  Gross per 24 hour  Intake   1005 ml  Output      0 ml  Net   1005 ml   Intake/Output: I/O last 3 completed shifts: In: 3536 [P.O.:500; I.V.:525] Out: -   Intake/Output this shift:  Total I/O In: 240 [P.O.:240] Out: -  Weight change: 1.996 kg (4 lb 6.4 oz) Gen:WD chronically ill-appearing AAF in NAD CVS:no rub Resp:cta RWE:RXVQMG Ext:tr edema   Recent Labs Lab 09/17/14 1748  09/18/14 0630 09/18/14 1705 09/19/14 0140 09/19/14 0845 09/20/14 0446 09/20/14 1832 09/21/14 0547 09/22/14 0424  NA 132*  --  134* 135 138 136 137 137 138 139  K 2.5*  --  3.3* 2.9* 3.2* 3.1* 2.5* 3.7 4.0 5.1  CL 109  --  112 112 112 110 102 104 107 110  CO2 6*  --  8* 11* 11* 12* 21 19 19  18*  GLUCOSE 174*  --  135* 111* 110* 84 97 170* 108* 71  BUN 23  --  26* 27* 27* 28* 22 24* 23 25*  CREATININE 4.14*  --  4.06* 4.12* 4.03* 3.78* 3.32* 3.39* 3.46* 3.37*  ALBUMIN 1.1*  --   --   --   --   --  <1.0* <1.0* <1.0* <1.0*  CALCIUM <4.0*  < > 5.0* 4.9* 4.8* 5.5* 4.6* 4.5* 4.9* 5.0*  PHOS 2.5  --  2.5  --   --   --  <1.0* 3.3 3.2 3.0  AST 44*  --   --   --   --   --   --   --   --   --   ALT 8  --   --   --   --   --   --   --   --   --   < > = values in this interval not displayed. Liver Function Tests:  Recent Labs Lab 09/17/14 1748  09/20/14 1832 09/21/14 0547 09/22/14 0424  AST 44*  --   --   --   --   ALT 8  --   --   --   --   ALKPHOS 234*  --   --   --   --   BILITOT 0.5  --   --   --   --   PROT 7.5  --   --   --   --   ALBUMIN 1.1*  < > <1.0* <1.0* <1.0*  < > = values in this interval not displayed.  Recent Labs Lab 09/17/14 1748   LIPASE 13   No results for input(s): AMMONIA in the last 168 hours. CBC:  Recent Labs Lab 09/18/14 0630  09/19/14 0140 09/19/14 0845 09/20/14 0446 09/21/14 0547 09/22/14 0424  WBC 31.8*  --  16.4* 14.4* 7.7 7.6 6.5  NEUTROABS 25.5*  --  12.5* 11.4*  --   --   --   HGB 5.4*  --  8.2* 9.4* 8.1* 8.9* 9.4*  HCT 16.1*  --  24.6* 27.9* 23.6* 26.5* 28.9*  MCV 93.1  --  85.7  85.8 84.6 86.9 89.2  PLT 50*  < > 43* 49* 45* 49* 74*  < > = values in this interval not displayed. Cardiac Enzymes:  Recent Labs Lab 09/18/14 0035 09/18/14 0812 09/19/14 0845  TROPONINI 0.06* 0.06* 0.06*   CBG: No results for input(s): GLUCAP in the last 168 hours.  Iron Studies: No results for input(s): IRON, TIBC, TRANSFERRIN, FERRITIN in the last 72 hours. Studies/Results: No results found. . sodium chloride   Intravenous Once  . calcitRIOL  0.25 mcg Oral Daily  . calcium carbonate  1 tablet Oral BID WC  . calcium carbonate  6 tablet Oral TID  . Chlorhexidine Gluconate Cloth  6 each Topical Q0600  . feeding supplement (PRO-STAT SUGAR FREE 64)  30 mL Oral TID WC  . folic acid  1 mg Oral Daily  . [START ON 09/23/2014] levofloxacin  500 mg Oral Q48H  . lipase/protease/amylase  12,000 Units Oral TID AC  . magnesium chloride  2 tablet Oral BID  . multivitamin with minerals  1 tablet Oral Daily  . mupirocin ointment  1 application Nasal BID  . sodium bicarbonate  650 mg Oral BID  . sodium chloride  3 mL Intravenous Q12H  . thiamine  100 mg Oral Daily    BMET    Component Value Date/Time   NA 139 09/22/2014 0424   K 5.1 09/22/2014 0424   CL 110 09/22/2014 0424   CO2 18* 09/22/2014 0424   GLUCOSE 71 09/22/2014 0424   BUN 25* 09/22/2014 0424   CREATININE 3.37* 09/22/2014 0424   CREATININE 2.02* 06/18/2014 1620   CALCIUM 5.0* 09/22/2014 0424   CALCIUM 4.0* 09/17/2014 2048   GFRNONAA 14* 09/22/2014 0424   GFRNONAA 27* 06/18/2014 1620   GFRAA 16* 09/22/2014 0424   GFRAA 31* 06/18/2014 1620    CBC    Component Value Date/Time   WBC 6.5 09/22/2014 0424   WBC 7.2 10/26/2011 1336   RBC 3.24* 09/22/2014 0424   RBC 2.54* 06/18/2014 1620   RBC 2.98* 10/26/2011 1336   HGB 9.4* 09/22/2014 0424   HGB 9.7* 10/26/2011 1336   HCT 28.9* 09/22/2014 0424   HCT 28.8* 10/26/2011 1336   PLT 74* 09/22/2014 0424   PLT 125* 10/26/2011 1336   MCV 89.2 09/22/2014 0424   MCV 96.6 10/26/2011 1336   MCH 29.0 09/22/2014 0424   MCH 32.6 10/26/2011 1336   MCHC 32.5 09/22/2014 0424   MCHC 33.7 10/26/2011 1336   RDW 18.9* 09/22/2014 0424   RDW 13.6 10/26/2011 1336   LYMPHSABS 1.5 09/19/2014 0845   LYMPHSABS 2.7 10/26/2011 1336   MONOABS 1.4* 09/19/2014 0845   MONOABS 0.8 10/26/2011 1336   EOSABS 0.0 09/19/2014 0845   EOSABS 0.1 10/26/2011 1336   BASOSABS 0.0 09/19/2014 0845   BASOSABS 0.0 10/26/2011 1336     Assessment/Plan:  1. AKI/CKD- presumably due to ischemic ATN in setting of diarrhea/hypovolemia with underlying SLE membranous and global sclerosis.  1. Improving with IVF's and near her baseline.   2. Nothing further to add.  Will sign off and have her follow up with Dr. Justin Mend 11/03/14 at 10:00 am.   3. Please call with any questions or concerns. 2. Metabolic acidosis- improved with bicarb 3. Hypokalemia- now too high, decrease supplements 4. Hypomagnesemia- improved with repletion 5. Anemia and thrombocytopenia- combo of SLE and Etoh abuse as well as CKD. S/p blood transfusion and FFP. Will start aranesp 6. SLE- does not appear to be active at this time 7.  Protein malnutrition- cont with protein supplements 8. Etoh abuse- makes it difficult for more aggressive therapy due to nonadherence and ongoing etoh use. 9. Thrombocytopenia- combo of Etoh and SLE Merle Whitehorn A

## 2014-09-23 LAB — RENAL FUNCTION PANEL
ANION GAP: 10 (ref 5–15)
Albumin: <1 g/dL — ABNORMAL LOW (ref 3.5–5.2)
BUN: 25 mg/dL — AB (ref 6–23)
CO2: 17 mmol/L — ABNORMAL LOW (ref 19–32)
Calcium: 5.3 mg/dL — CL (ref 8.4–10.5)
Chloride: 109 mmol/L (ref 96–112)
Creatinine, Ser: 3.33 mg/dL — ABNORMAL HIGH (ref 0.50–1.10)
GFR calc Af Amer: 17 mL/min — ABNORMAL LOW (ref >90)
GFR calc non Af Amer: 14 mL/min — ABNORMAL LOW (ref >90)
GLUCOSE: 79 mg/dL (ref 70–99)
Phosphorus: 3.1 mg/dL (ref 2.3–4.6)
Potassium: 4.3 mmol/L (ref 3.5–5.1)
Sodium: 136 mmol/L (ref 135–145)

## 2014-09-23 LAB — CBC
HCT: 26.2 % — ABNORMAL LOW (ref 36.0–46.0)
Hemoglobin: 8.5 g/dL — ABNORMAL LOW (ref 12.0–15.0)
MCH: 29.1 pg (ref 26.0–34.0)
MCHC: 32.4 g/dL (ref 30.0–36.0)
MCV: 89.7 fL (ref 78.0–100.0)
PLATELETS: 85 10*3/uL — AB (ref 150–400)
RBC: 2.92 MIL/uL — ABNORMAL LOW (ref 3.87–5.11)
RDW: 18.9 % — AB (ref 11.5–15.5)
WBC: 6.3 10*3/uL (ref 4.0–10.5)

## 2014-09-23 LAB — MAGNESIUM: Magnesium: 1.1 mg/dL — ABNORMAL LOW (ref 1.5–2.5)

## 2014-09-23 MED ORDER — LOPERAMIDE HCL 2 MG PO CAPS: 2.0000 mg | ORAL_CAPSULE | ORAL | Status: DC | PRN

## 2014-09-23 MED ORDER — MAGNESIUM CHLORIDE 64 MG PO TBEC: 2.0000 | DELAYED_RELEASE_TABLET | Freq: Two times a day (BID) | ORAL | Status: DC

## 2014-09-23 MED ORDER — BACID PO TABS: 2.0000 | ORAL_TABLET | Freq: Three times a day (TID) | ORAL | Status: DC

## 2014-09-23 MED ORDER — LEVOFLOXACIN 500 MG PO TABS: 500.0000 mg | ORAL_TABLET | ORAL | Status: AC

## 2014-09-23 NOTE — Discharge Summary (Signed)
Name: [name] [name] MRN: [id number] DOB: [birth year] 59 y.o. PCP: Otho Bellows, MD  Date of Admission: 09/17/2014  4:56 PM Date of Discharge: 09/23/2014 Attending Physician: Madilyn Fireman  Discharge Diagnosis: Principal Problem:   SOB (shortness of breath) Active Problems:   Normochromic normocytic anemia   Alcohol abuse   Depression   Hypertension, renal disease   Seizure disorder   Severe protein-calorie malnutrition   FSGS (focal segmental glomerulosclerosis)   Increased anion gap metabolic acidosis   Systolic and diastolic CHF, chronic   Anemia of chronic disease   AKI (acute kidney injury)   Leukocytosis   Chest pain   Thrombocytopenia   Elevated INR   Elevated troponin   Electrolyte abnormality  Discharge Medications:   Medication List    STOP taking these medications        colchicine 0.6 MG tablet     lipase/protease/amylase 12000 UNITS Cpep capsule  Commonly known as:  CREON     magnesium oxide 400 MG tablet  Commonly known as:  MAG-OX      TAKE these medications        albuterol 108 (90 BASE) MCG/ACT inhaler  Commonly known as:  PROAIR HFA  Inhale 1-2 puffs into the lungs every 6 (six) hours as needed for wheezing or shortness of breath.     amLODipine 5 MG tablet  Commonly known as:  NORVASC  Take 1 tablet (5 mg total) by mouth daily.     calcitRIOL 0.25 MCG capsule  Commonly known as:  ROCALTROL  Take 0.25 mcg by mouth daily.     calcium carbonate 500 MG chewable tablet  Commonly known as:  TUMS  Chew 6 tablets (1,200 mg of elemental calcium total) by mouth 3 (three) times daily. For bone health     cetirizine 10 MG tablet  Commonly known as:  ZYRTEC  Take 1 tablet (10 mg total) by mouth daily as needed for allergies.     feeding supplement (ENSURE COMPLETE) Liqd  Take 237 mLs by mouth 3 (three) times daily between meals.     FLUoxetine 10 MG capsule  Commonly known as:  PROZAC  Take 1 capsule (10 mg total) by mouth daily.  For depression     folic acid 1 MG tablet  Commonly known as:  FOLVITE  Take 1 tablet (1 mg total) by mouth daily. For folic acid replacement     furosemide 40 MG tablet  Commonly known as:  LASIX  Take 0.5 tablets (20 mg total) by mouth daily.     gabapentin 300 MG capsule  Commonly known as:  NEURONTIN  Take 2 capsules (600 mg total) by mouth 3 (three) times daily. For anxiety/pain control     guaiFENesin-dextromethorphan 100-10 MG/5ML syrup  Commonly known as:  ROBITUSSIN DM  Take 5 mLs by mouth every 4 (four) hours as needed for cough.     lactobacillus acidophilus Tabs tablet  Take 2 tablets by mouth 3 (three) times daily.     levofloxacin 500 MG tablet  Commonly known as:  LEVAQUIN  Take 1 tablet (500 mg total) by mouth every other day.     loperamide 2 MG capsule  Commonly known as:  IMODIUM  Take 1 capsule (2 mg total) by mouth as needed for diarrhea or loose stools.     magnesium chloride 64 MG Tbec SR tablet  Commonly known as:  SLOW-MAG  Take 2 tablets (128 mg total) by mouth 2 (two) times daily.  metoprolol tartrate 25 MG tablet  Commonly known as:  LOPRESSOR  Take 12.5 mg by mouth 2 (two) times daily.     multivitamin with minerals Tabs tablet  Take 1 tablet by mouth daily. For vitamin replacement     omeprazole 40 MG capsule  Commonly known as:  PRILOSEC  Take 1 capsule (40 mg total) by mouth daily.     sodium bicarbonate 650 MG tablet  Take 1 tablet (650 mg total) by mouth 2 (two) times daily.     thiamine 100 MG tablet  Commonly known as:  VITAMIN B-1  Take 1 tablet (100 mg total) by mouth daily. For low thiamine     vitamin B-12 250 MCG tablet  Commonly known as:  CYANOCOBALAMIN  Take 1 tablet (250 mcg total) by mouth every evening.        Disposition and follow-up:   [name] [name] was discharged from [facility] in Stable condition.  At the hospital follow up visit please address:  HCAP: Decision to be made by  outpatient physician regarding continuation of Levaquin given patient's symptoms at follow-up, patient will have completed 13 days of therapy at outpatient follow-up on April 27.  Diarrhea: Upon discharge, we discontinued patient's Creon given some association between the medicine and diarrhea. Would reassess patient's need for Creon as an outpatient. Patient was also discharged with Imodium, so will assess need for continuation.  Electrolyte abnormalities: Please assess patient's status regarding diarrhea as one may consider restarting magnesium oxide given potential interaction of magnesium chloride supplementation with chronic kidney disease.  2.  Labs / imaging needed at time of follow-up: Electrolytes including renal function panel and magnesium  3.  Pending labs/ test needing follow-up: none  Follow-up Appointments:     Follow-up Information    Follow up with Cresenciano Genre, MD On 09/30/2014.   Specialty:  Internal Medicine   Why:  @ 9 :15 am   Contact information:   [place] [address] [phone]       Discharge Instructions: Discharge Instructions    Call MD for:  difficulty breathing, headache or visual disturbances    Complete by:  As directed      Call MD for:  extreme fatigue    Complete by:  As directed      Call MD for:  hives    Complete by:  As directed      Call MD for:  persistant dizziness or light-headedness    Complete by:  As directed      Call MD for:  persistant nausea and vomiting    Complete by:  As directed      Call MD for:  redness, tenderness, or signs of infection (pain, swelling, redness, odor or green/yellow discharge around incision site)    Complete by:  As directed      Call MD for:  severe uncontrolled pain    Complete by:  As directed      Call MD for:  temperature >100.4    Complete by:  As directed      Diet - low sodium heart healthy    Complete by:  As directed      Increase activity slowly    Complete by:  As  directed            Consultations: Treatment Team:  Donato Heinz, MD  Procedures Performed:  Dg Chest 2 View  09/19/2014   CLINICAL DATA:  Shortness of  breath for 5 days  EXAM: CHEST  2 VIEW  COMPARISON:  09/17/2014  FINDINGS: Cardiac shadow is stable. Bibasilar lower lobe infiltrates are seen. These are new from the prior exam. No acute bony abnormality is noted.  IMPRESSION: Bilateral lower lobe infiltrates.   Electronically Signed   By: Inez Catalina M.D.   On: 09/19/2014 11:07   US Renal  09/19/2014   CLINICAL DATA:  Acute kidney injury.  Elevated creatinine.  EXAM: RENAL/URINARY TRACT ULTRASOUND COMPLETE  COMPARISON:  06/30/2013.  FINDINGS: Right Kidney:  Length: 10.1 cm. Increased parenchymal echogenicity. No mass or stone. No hydronephrosis.  Left Kidney:  Length: 10.1 cm. Increased parenchymal echogenicity. 12 mm cyst arises from the upper pole. No other masses. No stones. No hydronephrosis.  Bladder:  Decompressed and not well evaluated.  IMPRESSION: Findings consistent with medical renal disease with increased renal parenchymal echogenicity. Left renal cyst. No other masses. No hydronephrosis.   Electronically Signed   By: Lajean Manes M.D.   On: 09/19/2014 07:15   Dg Chest Port 1 View  09/17/2014   CLINICAL DATA:  Shortness of breath. Diarrhea with dizziness for 4 days. History of hypertension, alcohol abuse, tuberculosis, pneumonia and congestive heart failure.  EXAM: PORTABLE CHEST - 1 VIEW  COMPARISON:  Radiographs 12/29/2013 and 12/17/2013.  FINDINGS: 1758 hour. The heart size has decreased and is now normal. The mediastinal contours are normal. The lungs are clear. There is no pleural effusion or pneumothorax. Old healed fractures of both proximal humeri are noted. There is no evidence of acute fracture. Telemetry leads overlie the chest.  IMPRESSION: No active cardiopulmonary process.   Electronically Signed   By: Richardean Sale M.D.   On: 09/17/2014 17:23    2D Echo:    Study Conclusions  - Left ventricle: The cavity size was normal. Systolic function was normal. The estimated ejection fraction was in the range of 55% to 60%. Wall motion was normal; there were no regional wall motion abnormalities. Doppler parameters are consistent with abnormal left ventricular relaxation (grade 1 diastolic dysfunction). - Pulmonary arteries: Systolic pressure was mildly increased. PA peak pressure: 42 mm Hg (S).  Transthoracic echocardiography. M-mode, complete 2D, spectral Doppler, and color Doppler. Birthdate: Patient birthdate: 1955-12-19. Age: Patient is 59 yr old. Sex: Gender: female. BMI: 16.6 kg/m^2. Blood pressure:   101/75 Patient status: Inpatient. Study date: Study date: 09/18/2014. Study time: 09:19 AM. Location: Bedside.  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 These symptoms started together three days ago. The shortness of breath is worst when she is moving but also present at rest. She has been using her home oxygen at her normal rate of 4L. She uses 3 pillows to sleep at night but does not wake up short of breath. She denies recent illness, sick contacts or cough.  The chest pain is sharp and located in the middle of her chest. It has been continuously present throughout the past 3 days. She denies radiation of the pain or associated nausea, vomiting or diaphoresis. There is no position that relieves or exacerbates the pain. She has also noted 5 episodes of diarrhea, which occurred yesterday, and some periodic chills. She has been taking  her home medications as prescribed.   She states that her last alcoholic beverage was  5 days ago; she denies ingestion of any toxic alcohol, aspirin or tylenol.  Hospital Course by problem list: Principal Problem:   SOB (shortness of breath) Active Problems:   Normochromic normocytic anemia   Alcohol abuse   Depression   Hypertension, renal disease   Seizure disorder   Severe protein-calorie malnutrition   FSGS (focal segmental glomerulosclerosis)   Increased anion gap metabolic acidosis   Systolic and diastolic CHF, chronic   Anemia of chronic disease   AKI (acute kidney injury)   Leukocytosis   Chest pain   Thrombocytopenia   Elevated INR   Elevated troponin   Electrolyte abnormality   HCAP: Patient initially presenting with acute respiratory failure associated with chest pain. Echocardiogram was unremarkable for any change in left ventricular ejection fraction and only remarkable for grade 1 diastolic dysfunction. There was note of elevated pulmonary artery peak pressure. Patient's troponins were only marginally elevated at 0.06 and they remained stable. There was no concerning changes on EKG. Patient was also defined to have a combined metabolic acidosis along with a leukocytosis and lactic acidosis. Patient was found to have a chest x-ray consistent with infiltrate and so patient was treated for H With associated sepsis. Patient was started on vancomycin and Zosyn. On treatment, patient's white count normalized and patient improved from a respiratory standpoint. Patient was transitioned to oral Levaquin with continued improvement of symptoms. She has a plan and date of April 27 which is her follow-up appointment with Dr. Aundra Dubin for her Levaquin. Decision to be made by outpatient physician regarding continuation of Levaquin given patient's symptoms at follow-up, patient will have completed 13 days of therapy at outpatient follow-up on April 27.  Diarrhea: Patient with some increased diarrhea during her admission compared to her baseline level of diarrhea. C. difficile  was negative. GI pathogen panel was negative. Diarrhea could be multifactorial and likely secondary to magnesium supplementation, antibiotic therapy, or Creon. Patient's diarrhea was improved upon starting Imodium. Upon discharge, we discontinued patient's Creon given some association between the medicine and diarrhea. Would reassess patient's need for Creon as an outpatient. Patient was also discharged with Imodium, so will assess need for continuation.  Electrolyte abnormalities: Patient presenting with several electrolyte abnormalities including hyponatremia, hypokalemia, hypomagnesemia, and hypocalcemia. All of these electrolytes were supplemented both by IV and oral routes. Patient had a resolved hyponatremia and hypokalemia. Upon discharge, patient had a persistent hypomagnesemia of 1.1 and a assistant hypocalcemia (last ionized calcium of 0.7). Both of these abnormalities are chronic. Patient was discharged on magnesium chloride in her home regimen of calcium carbonate supplementation as well as calcitriol. Would question whether patient's abnormalities are secondary to alcohol intake or to possible type I RTA. Please assess patient's status regarding diarrhea as one may consider restarting magnesium oxide given potential interaction of magnesium chloride supplementation with chronic kidney disease.  Acute on chronic kidney disease: Patient's baseline creatinine is around 2-3. Patient's initial creatinine was 4.14 and decreased to 3.33 upon discharge. A renal ultrasound was unremarkable for any abnormalities apart from medical renal disease.   Anion gap metabolic acidosis with respiratory alkalosis: Patient's initial presentation with a delta-delta showing a concurrent non-anion gap acidosis. Patient was started on IV sodium bicarbonate supplementation while patient anion gap metabolic acidosis resolved with treatment of infection as described above. Patient was converted to oral bicarbonate  supplementation.   Elevated INR: Patient presenting with a initial  elevated INR, though patient's INR corrected during her hospitalization. Unclear etiology.  Thrombocytopenia: Patient's platelets were 77 upon initial evaluation and remained stable throughout her hospitalization. Would question whether this was reflective of a reactive thrombocytopenia in the context of  Bacteriuria: Patient's urine culture growing enterococcus and patient status post several days of vancomycin and Zosyn. However, patient did not report any urinary symptoms and so antibiotics were transitioned to Levaquin as described above for coverage of HCAP.   Neuropathic pain: Patient's gabapentin was not administered during her hospitalization. Upon discussion with her PCP, refills of her gabapentin were not given for the patient in the setting of extremely high dosage with questionable need for the medication.   Discharge Vitals:   BP 129/82 mmHg  Pulse 80  Temp(Src) 98.8 F (37.1 C) (Oral)  Resp 16  Ht 5\' 1"  (1.549 m)  Wt 92 lb 14.4 oz (42.139 kg)  BMI 17.56 kg/m2  SpO2 100%  Discharge Labs:  Results for orders placed or performed during the hospital encounter of 09/17/14 (from the past 24 hour(s))  Magnesium     Status: Abnormal   Collection Time: 09/23/14  6:07 AM  Result Value Ref Range   Magnesium 1.1 (L) 1.5 - 2.5 mg/dL  CBC     Status: Abnormal   Collection Time: 09/23/14  6:07 AM  Result Value Ref Range   WBC 6.3 4.0 - 10.5 K/uL   RBC 2.92 (L) 3.87 - 5.11 MIL/uL   Hemoglobin 8.5 (L) 12.0 - 15.0 g/dL   HCT 26.2 (L) 36.0 - 46.0 %   MCV 89.7 78.0 - 100.0 fL   MCH 29.1 26.0 - 34.0 pg   MCHC 32.4 30.0 - 36.0 g/dL   RDW 18.9 (H) 11.5 - 15.5 %   Platelets 85 (L) 150 - 400 K/uL  Renal function panel     Status: Abnormal   Collection Time: 09/23/14  6:07 AM  Result Value Ref Range   Sodium 136 135 - 145 mmol/L   Potassium 4.3 3.5 - 5.1 mmol/L   Chloride 109 96 - 112 mmol/L   CO2 17 (L) 19 - 32  mmol/L   Glucose, Bld 79 70 - 99 mg/dL   BUN 25 (H) 6 - 23 mg/dL   Creatinine, Ser 3.33 (H) 0.50 - 1.10 mg/dL   Calcium 5.3 (LL) 8.4 - 10.5 mg/dL   Phosphorus 3.1 2.3 - 4.6 mg/dL   Albumin <1.0 (L) 3.5 - 5.2 g/dL   GFR calc non Af Amer 14 (L) >90 mL/min   GFR calc Af Amer 17 (L) >90 mL/min   Anion gap 10 5 - 15    Signed: Luan Moore, MD 09/23/2014, 5:01 PM    Services Ordered on Discharge: none Equipment Ordered on Discharge: none

## 2014-09-23 NOTE — Discharge Instructions (Signed)
We have prescribed a new type of magnesium for you to take which should not  cause as much diarrhea. It is called magnesium chloride. Please check with her doctor to see whether you should continue this or switch fracture magnesium oxide at your follow-up appointment. Additionally, we have discontinued your Creon as it may be contributing to your symptoms of diarrhea. Additionally, we have prescribed a pair of medications: Imodium and probiotics to help with your diarrhea. Please follow-up with your physician as indicated in your discharge paperwork.

## 2014-09-23 NOTE — Progress Notes (Signed)
  PROGRESS NOTE MEDICINE TEACHING ATTENDING   Day 6 of stay Patient name: GAYLYNN SEIPLE   Medical record number: [id number] Date of birth: 03-Oct-1955   Diarrhea improved, feels better, wants to go home.  Blood pressure 112/79, pulse 88, temperature 98.8 F (37.1 C), temperature source Oral, resp. rate 18, height 5\' 1"  (1.549 m), weight 92 lb 14.4 oz (42.139 kg), SpO2 99 %. The patient is alert and oriented, comfortable, in no acute distress. PERRL, EOMI. Heart exhibits regular rate and rhythm, no murmurs. Lungs are clear to auscultation. Abdomen is soft and non-tender. There is no pedal edema and good pedal pulses. There are no gross focal neurological deficits apparent.   Assessment and Plan  Likely discharge home today.   HCAP improved, on oral antibiotics, tolerating well.   Diarrhea improved.   Electrolyte imbalances - appropriate repletion strategies in place.   Renal impairment - creatinine stable at 3.33  Thrombocytopenia - platelets stable at 85.  Counseling for Alcohol abstinence done today - again.  I have discussed the care of this patient with my IM team residents. Please see the resident note for details.  Madilyn Fireman 09/23/2014, 12:47 PM.

## 2014-09-23 NOTE — Progress Notes (Signed)
Subjective:   Day of hospitalization: 6  Patient states that her diarrhea has improved. She denies any associated abdominal pain. She states that she is ready to go home. She states that her breathing is back to her baseline.  Objective:   Vital signs in last 24 hours: Filed Vitals:   09/22/14 1100 09/22/14 1300 09/22/14 2045 09/23/14 0559  BP: 150/99 141/97 114/78 112/79  Pulse:   88 88  Temp:   98.8 F (37.1 C) 98.8 F (37.1 C)  TempSrc:   Oral Oral  Resp: 23 21 20 18   Height:      Weight:    92 lb 14.4 oz (42.139 kg)  SpO2:   99% 99%    Weight: Filed Weights   09/21/14 0400 09/22/14 0400 09/23/14 0559  Weight: 88 lb (39.917 kg) 92 lb 6.4 oz (41.912 kg) 92 lb 14.4 oz (42.139 kg)    I/Os:  Intake/Output Summary (Last 24 hours) at 09/23/14 1149 Last data filed at 09/23/14 0800  Gross per 24 hour  Intake    463 ml  Output      0 ml  Net    463 ml    Physical Exam: Constitutional: Vital signs reviewed.  Sitting up in bed in no acute distress. HEENT: [place]/AT; EOMI, conjunctivae normal, no scleral icterus  Cardiovascular: RRR, no MRG Pulmonary/Chest: normal respiratory effort, no accessory muscle use, CTAB, no wheezes, rales, or rhonchi Abdominal: soft, nontender, nondistended, positive bowel signs in all 4 quadrants Neurological: A&O x3, CN II-XII grossly intact; moving all extremities  Extremities: 2+DP b/l, no C/C/E  Skin: Warm, dry and intact.   Lab Results:  BMP:  Recent Labs Lab 09/22/14 0424 09/23/14 0607  NA 139 136  K 5.1 4.3  CL 110 109  CO2 18* 17*  GLUCOSE 71 79  BUN 25* 25*  CREATININE 3.37* 3.33*  CALCIUM 5.0* 5.3*  MG 1.0* 1.1*  PHOS 3.0 3.1    CBC:  Recent Labs Lab 09/19/14 0140 09/19/14 0845  09/22/14 0424 09/23/14 0607  WBC 16.4* 14.4*  < > 6.5 6.3  NEUTROABS 12.5* 11.4*  --   --   --   HGB 8.2* 9.4*  < > 9.4* 8.5*  HCT 24.6* 27.9*  < > 28.9* 26.2*  MCV 85.7 85.8  < > 89.2 89.7  PLT 43* 49*  < > 74* 85*  < > = values  in this interval not displayed.  Coagulation:  Recent Labs Lab 09/18/14 1110 09/18/14 1705 09/19/14 0845 09/20/14 0446  LABPROT 33.9* 22.9* 19.1* 19.5*  INR 3.31* 2.00* 1.59* 1.63*   LFTs:  Recent Labs Lab 09/17/14 1748  09/22/14 0424 09/23/14 0607  AST 44*  --   --   --   ALT 8  --   --   --   ALKPHOS 234*  --   --   --   BILITOT 0.5  --   --   --   PROT 7.5  --   --   --   ALBUMIN 1.1*  < > <1.0* <1.0*  < > = values in this interval not displayed.  Pancreatic Enzymes:  Recent Labs Lab 09/17/14 1748  LIPASE 13    Lactic Acid/Procalcitonin:  Recent Labs Lab 09/18/14 1050 09/18/14 1705 09/19/14 0845 09/19/14 1400  LATICACIDVEN 2.4* 2.9* 1.5  --   PROCALCITON  --   --   --  3.38    Ammonia: No results for input(s): AMMONIA in the last 168  hours.  Cardiac Enzymes:  Recent Labs Lab 09/18/14 0035 09/18/14 0812 09/19/14 0845  TROPONINI 0.06* 0.06* 0.06*    EKG: EKG Interpretation  Date/Time:  Thursday September 17 2014 16:56:33 EDT Ventricular Rate:  127 PR Interval:    QRS Duration: 62 QT Interval:  388 QTC Calculation: 563 R Axis:   54 Text Interpretation:  Critical Test Result: Long QTc Accelerated Junctional rhythm Low voltage QRS Cannot rule out Anteroseptal infarct , age undetermined Otherwise no significant change Confirmed by HARRISON  MD, FORREST ([id number]) on 09/17/2014 5:07:17 PM   BNP: No results for input(s): PROBNP in the last 168 hours.  D-Dimer:  Recent Labs Lab 09/18/14 1110  DDIMER 3.09*    Urinalysis:  Recent Labs Lab 09/17/14 1849  COLORURINE AMBER*  LABSPEC 1.020  PHURINE 5.5  GLUCOSEU NEGATIVE  HGBUR MODERATE*  BILIRUBINUR SMALL*  KETONESUR NEGATIVE  PROTEINUR >300*  UROBILINOGEN 0.2  NITRITE NEGATIVE  LEUKOCYTESUR NEGATIVE    Micro Results: Recent Results (from the past 240 hour(s))  Urine culture     Status: None   Collection Time: 09/17/14  6:49 PM  Result Value Ref Range Status   Specimen  Description URINE, CLEAN CATCH  Final   Special Requests Normal  Final   Colony Count   Final    >=100,000 COLONIES/ML Performed at [facility]    Culture   Final    ENTEROCOCCUS SPECIES Performed at [facility]    Report Status 09/21/2014 FINAL  Final   Organism ID, Bacteria ENTEROCOCCUS SPECIES  Final      Susceptibility   Enterococcus species - MIC*    AMPICILLIN >=32 RESISTANT Resistant     LEVOFLOXACIN >=8 RESISTANT Resistant     NITROFURANTOIN 64 INTERMEDIATE Intermediate     VANCOMYCIN <=0.5 SENSITIVE Sensitive     TETRACYCLINE >=16 RESISTANT Resistant     * ENTEROCOCCUS SPECIES  MRSA PCR Screening     Status: Abnormal   Collection Time: 09/17/14  9:31 PM  Result Value Ref Range Status   MRSA by PCR POSITIVE (A) NEGATIVE Final    Comment:        The GeneXpert MRSA Assay (FDA approved for NASAL specimens only), is one component of a comprehensive MRSA colonization surveillance program. It is not intended to diagnose MRSA infection nor to guide or monitor treatment for MRSA infections. RESULT CALLED TO, READ BACK BY AND VERIFIED WITH: S.COOPER,RN AT 0030 BY L.PITT 09/18/14   Culture, blood (routine x 2)     Status: None (Preliminary result)   Collection Time: 09/18/14 12:35 AM  Result Value Ref Range Status   Specimen Description BLOOD RIGHT HAND  Final   Special Requests BOTTLES DRAWN AEROBIC AND ANAEROBIC 3CC  Final   Culture   Final           BLOOD CULTURE RECEIVED NO GROWTH TO DATE CULTURE WILL BE HELD FOR 5 DAYS BEFORE ISSUING A FINAL NEGATIVE REPORT Performed at [facility]    Report Status PENDING  Incomplete  Clostridium Difficile by PCR     Status: None   Collection Time: 09/19/14  6:08 PM  Result Value Ref Range Status   C difficile by pcr NEGATIVE NEGATIVE Final    Blood Culture:    Component Value Date/Time   SDES BLOOD RIGHT HAND 09/18/2014 0035   SPECREQUEST BOTTLES DRAWN AEROBIC AND ANAEROBIC 3CC 09/18/2014 0035     CULT  09/18/2014 0035  BLOOD CULTURE RECEIVED NO GROWTH TO DATE CULTURE WILL BE HELD FOR 5 DAYS BEFORE ISSUING A FINAL NEGATIVE REPORT Performed at [place] PENDING 09/18/2014 0035    Studies/Results: No results found.  Medications:  Scheduled Meds: . sodium chloride   Intravenous Once  . calcitRIOL  0.25 mcg Oral Daily  . calcium carbonate  1 tablet Oral BID WC  . calcium carbonate  6 tablet Oral TID  . feeding supplement (PRO-STAT SUGAR FREE 64)  30 mL Oral TID WC  . folic acid  1 mg Oral Daily  . lactobacillus acidophilus  2 tablet Oral TID  . levofloxacin  500 mg Oral Q48H  . lipase/protease/amylase  12,000 Units Oral TID AC  . magnesium chloride  2 tablet Oral BID  . multivitamin with minerals  1 tablet Oral Daily  . sodium bicarbonate  650 mg Oral BID  . sodium chloride  3 mL Intravenous Q12H  . thiamine  100 mg Oral Daily   Continuous Infusions:   PRN Meds:   Antibiotics: Antibiotics Given (last 72 hours)    Date/Time Action Medication Dose Rate   09/20/14 1640 Given  [only one IV access]   ceFEPIme (MAXIPIME) 500 mg in dextrose 5 % 50 mL IVPB 500 mg 100 mL/hr   09/21/14 1234 Given   levofloxacin (LEVAQUIN) tablet 750 mg 750 mg    09/23/14 2841 Given   levofloxacin (LEVAQUIN) tablet 500 mg 500 mg       Day of Hospitalization: 6  Consults: Treatment Team:  Donato Heinz, MD  Assessment/Plan:   Principal Problem:   SOB (shortness of breath) Active Problems:   Normochromic normocytic anemia   Alcohol abuse   Depression   Hypertension, renal disease   Seizure disorder   Severe protein-calorie malnutrition   FSGS (focal segmental glomerulosclerosis)   Increased anion gap metabolic acidosis   Systolic and diastolic CHF, chronic   Anemia of chronic disease   AKI (acute kidney injury)   Leukocytosis   Chest pain   Thrombocytopenia   Elevated INR   Elevated troponin   Electrolyte abnormality  HCAP:  Patient continues to breathe well on room air.  Patient continues to do well on room air. Patient without any symptoms of dyspnea or coughing. Patient was initially started on vancomycin and Zosyn for sepsis (09/18/2014). Patient's white count has normalized. -continue with Levaquin with a plan end date of April 27 (clinic appointment) for total of 13 days. -Outpatient physician to assess need for attenuation at that point. -Blood cultures no growth to date  Diarrhea: Improved over last 24 hours. C. Difficile negative. Likely secondary to magnesium supplementation or antibiotic therapy. There is also some possibility for Creon to be contributory. -Lomotil -Probiotics -Consider discontinuation of Creon.  Electrolyte abnormalities: Magnesium stable at 1.1 today. Potassium within normal limits at 4.6. -Appreciate nephrology recommendations -Continue magnesium supplementation with magnesium chloride. -Consider conversion to magnesium oxide as an outpatient given the potential interaction with chronic kidney disease. -Potassium has normalized -home calcium carbonate and calcitriol supplementation restarted  Acute on chronic kidney disease: Creatinine stable at 3.33. This may represent patient's new baseline. -Continue sodium bicarbonate 650 mg twice a day by mouth  Metabolic acidosis: Bicarbonate stable at 17 with no elevated anion gap. -Continue to replete with sodium bicarbonate as above.  Elevated INR: resolved.  Thrombocytopenia: platelets stable he low at 85. -Will hold on pharmacological prophylaxis for DVT.  ?UTI: patient growing greater than 100,000 enterococcus. Patient status  post several days of vancomycin and is currently no longer symptomatic. No indication to further treat at this point.  Diet: Heart healthy Prophylaxis: SCDs given profound thrombocytopenia Disposition Anticipated discharge in approximately 0-1 day(s).   -Patient will need home health PT -Gabapentin not to  be resumed upon discharge given lack of symptoms and high dosage (per PCP)   LOS: 6 days   Luan Moore, MD PGY-1, Internal Medicine Teaching Service 09/23/2014, 11:49 AM

## 2014-09-24 LAB — CULTURE, BLOOD (ROUTINE X 2): CULTURE: NO GROWTH

## 2014-09-25 ENCOUNTER — Other Ambulatory Visit: Payer: Self-pay | Admitting: *Deleted

## 2014-09-25 NOTE — Telephone Encounter (Signed)
Last refill on 4/20 had qty of #60; pt getting 2 tabs BID. [facility] is requesting qty of #360 - 3 month supply. Thanks

## 2014-09-26 NOTE — Telephone Encounter (Signed)
Patient was discharged with a short course of magnesium chloride since she was still having diarrhea. However, magnesium chloride is not preferred in renal disease. Plan for reassessment of risks and benefits at hospital follow up visit with potential for switching back to magnesium oxide if diarrhea is improved.

## 2014-09-28 DIAGNOSIS — D510 Vitamin B12 deficiency anemia due to intrinsic factor deficiency: Secondary | ICD-10-CM | POA: Diagnosis not present

## 2014-09-28 DIAGNOSIS — E785 Hyperlipidemia, unspecified: Secondary | ICD-10-CM | POA: Diagnosis not present

## 2014-09-28 DIAGNOSIS — F1020 Alcohol dependence, uncomplicated: Secondary | ICD-10-CM | POA: Diagnosis not present

## 2014-09-28 DIAGNOSIS — I5040 Unspecified combined systolic (congestive) and diastolic (congestive) heart failure: Secondary | ICD-10-CM | POA: Diagnosis not present

## 2014-09-28 DIAGNOSIS — N184 Chronic kidney disease, stage 4 (severe): Secondary | ICD-10-CM | POA: Diagnosis not present

## 2014-09-28 DIAGNOSIS — F329 Major depressive disorder, single episode, unspecified: Secondary | ICD-10-CM | POA: Diagnosis not present

## 2014-09-28 DIAGNOSIS — L930 Discoid lupus erythematosus: Secondary | ICD-10-CM | POA: Diagnosis not present

## 2014-09-28 DIAGNOSIS — I129 Hypertensive chronic kidney disease with stage 1 through stage 4 chronic kidney disease, or unspecified chronic kidney disease: Secondary | ICD-10-CM | POA: Diagnosis not present

## 2014-09-28 DIAGNOSIS — E559 Vitamin D deficiency, unspecified: Secondary | ICD-10-CM | POA: Diagnosis not present

## 2014-09-30 ENCOUNTER — Telehealth: Payer: Self-pay | Admitting: *Deleted

## 2014-09-30 ENCOUNTER — Encounter: Payer: Self-pay | Admitting: Internal Medicine

## 2014-09-30 ENCOUNTER — Ambulatory Visit (INDEPENDENT_AMBULATORY_CARE_PROVIDER_SITE_OTHER): Payer: Medicare Other | Admitting: Internal Medicine

## 2014-09-30 VITALS — BP 138/92 | HR 96 | Temp 97.7°F | Ht 61.0 in | Wt 91.4 lb

## 2014-09-30 DIAGNOSIS — D649 Anemia, unspecified: Secondary | ICD-10-CM | POA: Diagnosis not present

## 2014-09-30 DIAGNOSIS — R0602 Shortness of breath: Secondary | ICD-10-CM

## 2014-09-30 DIAGNOSIS — D696 Thrombocytopenia, unspecified: Secondary | ICD-10-CM | POA: Diagnosis not present

## 2014-09-30 DIAGNOSIS — R600 Localized edema: Secondary | ICD-10-CM

## 2014-09-30 DIAGNOSIS — R5381 Other malaise: Secondary | ICD-10-CM | POA: Diagnosis not present

## 2014-09-30 DIAGNOSIS — E878 Other disorders of electrolyte and fluid balance, not elsewhere classified: Secondary | ICD-10-CM | POA: Diagnosis not present

## 2014-09-30 DIAGNOSIS — R197 Diarrhea, unspecified: Secondary | ICD-10-CM

## 2014-09-30 DIAGNOSIS — D638 Anemia in other chronic diseases classified elsewhere: Secondary | ICD-10-CM | POA: Diagnosis not present

## 2014-09-30 LAB — CBC WITH DIFFERENTIAL/PLATELET
BASOS ABS: 0.2 10*3/uL — AB (ref 0.0–0.1)
Basophils Relative: 3 % — ABNORMAL HIGH (ref 0–1)
Eosinophils Absolute: 0 10*3/uL (ref 0.0–0.7)
Eosinophils Relative: 0 % (ref 0–5)
HCT: 25.5 % — ABNORMAL LOW (ref 36.0–46.0)
HEMOGLOBIN: 8.1 g/dL — AB (ref 12.0–15.0)
LYMPHS PCT: 35 % (ref 12–46)
Lymphs Abs: 1.8 10*3/uL (ref 0.7–4.0)
MCH: 28.7 pg (ref 26.0–34.0)
MCHC: 31.8 g/dL (ref 30.0–36.0)
MCV: 90.4 fL (ref 78.0–100.0)
MPV: 11.7 fL (ref 8.6–12.4)
Monocytes Absolute: 0.6 10*3/uL (ref 0.1–1.0)
Monocytes Relative: 11 % (ref 3–12)
Neutro Abs: 2.6 10*3/uL (ref 1.7–7.7)
Neutrophils Relative %: 51 % (ref 43–77)
Platelets: 150 10*3/uL (ref 150–400)
RBC: 2.82 MIL/uL — ABNORMAL LOW (ref 3.87–5.11)
RDW: 17.8 % — ABNORMAL HIGH (ref 11.5–15.5)
WBC: 5 10*3/uL (ref 4.0–10.5)

## 2014-09-30 NOTE — Assessment & Plan Note (Signed)
Repeat CBC today 

## 2014-09-30 NOTE — Telephone Encounter (Signed)
PT informed

## 2014-09-30 NOTE — Assessment & Plan Note (Signed)
Will check Mag, Phos today and advise on need to continue Mag and at what dose

## 2014-09-30 NOTE — Assessment & Plan Note (Signed)
Resolved after completing 2 weeks on Levaquin Will need repeat CXR at f/u in 1 month to make sure 4/16 infiltrates resolved

## 2014-09-30 NOTE — Progress Notes (Signed)
INTERNAL MEDICINE TEACHING ATTENDING ADDENDUM - Ayde Record, MD: I reviewed and discussed at the time of visit with the resident Dr. McLean, the patient's medical history, physical examination, diagnosis and results of pertinent tests and treatment and I agree with the patient's care as documented.  

## 2014-09-30 NOTE — Assessment & Plan Note (Signed)
Getting [facility] PT at home x 5 weeks

## 2014-09-30 NOTE — Patient Instructions (Signed)
Please follow up in 1 month I will let you know about your labs  We will have home health nurse come to your house to wrap your legs  Stop Immodium  Take care  Edema Edema is an abnormal buildup of fluids in your bodytissues. Edema is somewhatdependent on gravity to pull the fluid to the lowest place in your body. That makes the condition more common in the legs and thighs (lower extremities). Painless swelling of the feet and ankles is common and becomes more likely as you get older. It is also common in looser tissues, like around your eyes.  When the affected area is squeezed, the fluid may move out of that spot and leave a dent for a few moments. This dent is called pitting.  CAUSES  There are many possible causes of edema. Eating too much salt and being on your feet or sitting for a long time can cause edema in your legs and ankles. Hot weather may make edema worse. Common medical causes of edema include:  Heart failure.  Liver disease.  Kidney disease.  Weak blood vessels in your legs.  Cancer.  An injury.  Pregnancy.  Some medications.  Obesity. SYMPTOMS  Edema is usually painless.Your skin may look swollen or shiny.  DIAGNOSIS  Your health care provider may be able to diagnose edema by asking about your medical history and doing a physical exam. You may need to have tests such as X-rays, an electrocardiogram, or blood tests to check for medical conditions that may cause edema.  TREATMENT  Edema treatment depends on the cause. If you have heart, liver, or kidney disease, you need the treatment appropriate for these conditions. General treatment may include:  Elevation of the affected body part above the level of your heart.  Compression of the affected body part. Pressure from elastic bandages or support stockings squeezes the tissues and forces fluid back into the blood vessels. This keeps fluid from entering the tissues.  Restriction of fluid and salt  intake.  Use of a water pill (diuretic). These medications are appropriate only for some types of edema. They pull fluid out of your body and make you urinate more often. This gets rid of fluid and reduces swelling, but diuretics can have side effects. Only use diuretics as directed by your health care provider. HOME CARE INSTRUCTIONS   Keep the affected body part above the level of your heart when you are lying down.   Do not sit still or stand for prolonged periods.   Do not put anything directly under your knees when lying down.  Do not wear constricting clothing or garters on your upper legs.   Exercise your legs to work the fluid back into your blood vessels. This may help the swelling go down.   Wear elastic bandages or support stockings to reduce ankle swelling as directed by your health care provider.   Eat a low-salt diet to reduce fluid if your health care provider recommends it.   Only take medicines as directed by your health care provider. SEEK MEDICAL CARE IF:   Your edema is not responding to treatment.  You have heart, liver, or kidney disease and notice symptoms of edema.  You have edema in your legs that does not improve after elevating them.   You have sudden and unexplained weight gain. SEEK IMMEDIATE MEDICAL CARE IF:   You develop shortness of breath or chest pain.   You cannot breathe when you lie down.  You  develop pain, redness, or warmth in the swollen areas.   You have heart, liver, or kidney disease and suddenly get edema.  You have a fever and your symptoms suddenly get worse. MAKE SURE YOU:   Understand these instructions.  Will watch your condition.  Will get help right away if you are not doing well or get worse. Document Released: 05/22/2005 Document Revised: 10/06/2013 Document Reviewed: 03/14/2013 [facility] Patient Information 2015 [place], [place]. This information is not intended to replace advice given to you by your health  care provider. Make sure you discuss any questions you have with your health care provider.

## 2014-09-30 NOTE — Telephone Encounter (Signed)
Call from [place] with [facility] - # [phone]  PT went out to home for PT evaluation Shanon Brow is asking for Verbal Order to see pt 2 times a week for 5 weeks -  Will work on strength, balance endurance and fall prevention. Will this be Okay with you?

## 2014-09-30 NOTE — Telephone Encounter (Signed)
Sounds good. Thanks 

## 2014-09-30 NOTE — Progress Notes (Signed)
   Subjective:    [name], female    DOB: 08-Sep-1955, 59 y.o.   MRN: [id number]  HPI Comments: 59 y.o significant pmh (i.e combined HF, FSGS/nephrotic syndrome, CKD 4, lupus) presents for HFU  She was hospitalized for SOB, sepsis 2/2 enterococcus UTI, anemia, electrolyte abnormalities 4/14 to 4/20, b/l infiltrates on CXR 4/16 on Levaquin x 2 weeks (last day tomorrow), diarrhea 1. Leg edema-she reports feet and leg swelling since discharge and while in the hospital now causing 10/10 pain in legs.  She cant wear compression stockings but has tried epsolm salt soaks, and elevation w/o relief. She is also taking Lasix 20 mg bid.   2. Diarrhea resolved but patient is still taking Magnesium, Creon, and she is also taking Immodium 1 x per day.   3. SOB denies.   4. Electrolyte abnormalities will reassess need for Magnesium   SH-she denies alcohol use          Review of Systems  Respiratory: Negative for shortness of breath.   Cardiovascular: Positive for leg swelling. Negative for chest pain.  Gastrointestinal: Negative for abdominal pain, diarrhea and blood in stool.  Genitourinary: Negative for hematuria.       Objective:   Physical Exam  Constitutional: She is oriented to person, place, and time. Vital signs are normal. She appears well-developed and well-nourished. She is cooperative. No distress.  HENT:  Head: Normocephalic and atraumatic.  Eyes: Conjunctivae are normal. Right eye exhibits no discharge. Left eye exhibits no discharge. No scleral icterus.  Cardiovascular: Normal rate, regular rhythm, S1 normal, S2 normal and normal heart sounds.   No murmur heard. 2 to 3+ feet/lower leg edema pitting  Pulmonary/Chest: Effort normal and breath sounds normal. No respiratory distress. She has no wheezes.  Abdominal: Soft. Bowel sounds are normal. There is no tenderness.  Neurological: She is alert and oriented to person, place, and time.  Skin: Skin is warm, dry  and intact. No rash noted. She is not diaphoretic.  Psychiatric: She has a normal mood and affect. Her speech is normal and behavior is normal. Judgment and thought content normal. Cognition and memory are normal.  Nursing note and vitals reviewed.         Assessment & Plan:  F/u in 1 months

## 2014-09-30 NOTE — Assessment & Plan Note (Signed)
Etiology likely multifactorial 2/2 CKD 4, FSGS, combined HF, low protein in blood  Continue elevation will ordered [facility] to [facility] wrap legs to knee Continue diuretics but will check CMET today

## 2014-09-30 NOTE — Assessment & Plan Note (Signed)
Repeat CBC today post transfusion in the hospital

## 2014-09-30 NOTE — Assessment & Plan Note (Signed)
Improved though taking Mag and Creon  Advised can stop immodium

## 2014-10-01 ENCOUNTER — Telehealth: Payer: Self-pay | Admitting: Internal Medicine

## 2014-10-01 ENCOUNTER — Other Ambulatory Visit: Payer: Self-pay | Admitting: Internal Medicine

## 2014-10-01 DIAGNOSIS — F1020 Alcohol dependence, uncomplicated: Secondary | ICD-10-CM | POA: Diagnosis not present

## 2014-10-01 DIAGNOSIS — E559 Vitamin D deficiency, unspecified: Secondary | ICD-10-CM | POA: Diagnosis not present

## 2014-10-01 DIAGNOSIS — N184 Chronic kidney disease, stage 4 (severe): Secondary | ICD-10-CM | POA: Diagnosis not present

## 2014-10-01 DIAGNOSIS — I129 Hypertensive chronic kidney disease with stage 1 through stage 4 chronic kidney disease, or unspecified chronic kidney disease: Secondary | ICD-10-CM | POA: Diagnosis not present

## 2014-10-01 DIAGNOSIS — D510 Vitamin B12 deficiency anemia due to intrinsic factor deficiency: Secondary | ICD-10-CM | POA: Diagnosis not present

## 2014-10-01 DIAGNOSIS — E785 Hyperlipidemia, unspecified: Secondary | ICD-10-CM | POA: Diagnosis not present

## 2014-10-01 DIAGNOSIS — I5040 Unspecified combined systolic (congestive) and diastolic (congestive) heart failure: Secondary | ICD-10-CM | POA: Diagnosis not present

## 2014-10-01 DIAGNOSIS — L930 Discoid lupus erythematosus: Secondary | ICD-10-CM | POA: Diagnosis not present

## 2014-10-01 DIAGNOSIS — F329 Major depressive disorder, single episode, unspecified: Secondary | ICD-10-CM | POA: Diagnosis not present

## 2014-10-01 LAB — COMPLETE METABOLIC PANEL WITH GFR
ALT: 12 U/L (ref 0–35)
AST: 69 U/L — AB (ref 0–37)
Albumin: <2 g/dL — ABNORMAL LOW (ref 3.5–5.2)
Alkaline Phosphatase: 354 U/L — ABNORMAL HIGH (ref 39–117)
BUN: 22 mg/dL (ref 6–23)
CALCIUM: 5.3 mg/dL — AB (ref 8.4–10.5)
CO2: 9 mEq/L — ABNORMAL LOW (ref 19–32)
CREATININE: 3.42 mg/dL — AB (ref 0.50–1.10)
Chloride: 113 mEq/L — ABNORMAL HIGH (ref 96–112)
GFR, Est African American: 16 mL/min — ABNORMAL LOW
GFR, Est Non African American: 14 mL/min — ABNORMAL LOW
Glucose, Bld: 88 mg/dL (ref 70–99)
Potassium: 3.6 mEq/L (ref 3.5–5.3)
Sodium: 139 mEq/L (ref 135–145)
Total Bilirubin: 0.5 mg/dL (ref 0.2–1.2)
Total Protein: 7.5 g/dL (ref 6.0–8.3)

## 2014-10-01 LAB — PHOSPHORUS: Phosphorus: 6.5 mg/dL — ABNORMAL HIGH (ref 2.3–4.6)

## 2014-10-01 LAB — MAGNESIUM: Magnesium: 0.8 mg/dL — ABNORMAL LOW (ref 1.5–2.5)

## 2014-10-01 NOTE — Telephone Encounter (Signed)
Spoke with patient informed her of her labs.  She just got the Rx for sodium bicarbonate yesterday 650 mg bid.  CO was 9. I asked she take 1300 mg bid until follow up.  She also has only been taking 6 tablets of Ca (TUMS) 1 x per day instead of 3 x informed to take (6 TUMS) 3 x/day.  She also was informed to take Slow Mag 128 mg bid as instructed at last discharge.  She may need to be on a phosphorus binder and will see renal in 1-2 weeks. Advised pt to come back to clinic next week Thursday or Friday for follow up and repeat labs CMET, Mag, phos. Consider ionized calcium as well.  She agrees.  Will also try to get [facility] RN to ACE wrap legs b/l for edema and also for help with meds.  Advised pt if she is not feeling well b/f 1 week come to clinic or the ED.    Aundra Dubin MD

## 2014-10-05 DIAGNOSIS — F329 Major depressive disorder, single episode, unspecified: Secondary | ICD-10-CM | POA: Diagnosis not present

## 2014-10-05 DIAGNOSIS — L930 Discoid lupus erythematosus: Secondary | ICD-10-CM | POA: Diagnosis not present

## 2014-10-05 DIAGNOSIS — F1020 Alcohol dependence, uncomplicated: Secondary | ICD-10-CM | POA: Diagnosis not present

## 2014-10-05 DIAGNOSIS — E559 Vitamin D deficiency, unspecified: Secondary | ICD-10-CM | POA: Diagnosis not present

## 2014-10-05 DIAGNOSIS — N184 Chronic kidney disease, stage 4 (severe): Secondary | ICD-10-CM | POA: Diagnosis not present

## 2014-10-05 DIAGNOSIS — D510 Vitamin B12 deficiency anemia due to intrinsic factor deficiency: Secondary | ICD-10-CM | POA: Diagnosis not present

## 2014-10-05 DIAGNOSIS — E785 Hyperlipidemia, unspecified: Secondary | ICD-10-CM | POA: Diagnosis not present

## 2014-10-05 DIAGNOSIS — I129 Hypertensive chronic kidney disease with stage 1 through stage 4 chronic kidney disease, or unspecified chronic kidney disease: Secondary | ICD-10-CM | POA: Diagnosis not present

## 2014-10-05 DIAGNOSIS — I5040 Unspecified combined systolic (congestive) and diastolic (congestive) heart failure: Secondary | ICD-10-CM | POA: Diagnosis not present

## 2014-10-06 ENCOUNTER — Other Ambulatory Visit: Payer: Self-pay | Admitting: Internal Medicine

## 2014-10-06 ENCOUNTER — Telehealth: Payer: Self-pay | Admitting: Licensed Clinical Social Worker

## 2014-10-06 DIAGNOSIS — E878 Other disorders of electrolyte and fluid balance, not elsewhere classified: Secondary | ICD-10-CM

## 2014-10-06 DIAGNOSIS — N184 Chronic kidney disease, stage 4 (severe): Secondary | ICD-10-CM

## 2014-10-06 NOTE — Addendum Note (Signed)
Addended by: Hulan Fray on: 10/06/2014 05:57 PM   Modules accepted: Orders

## 2014-10-06 NOTE — Telephone Encounter (Signed)
During hospital f/u, physician placed order for [facility] RN.  EMR states pt is current with [facility] for [facility] PT.   CSW faxed [facility] RN order to [facility]

## 2014-10-07 ENCOUNTER — Telehealth: Payer: Self-pay | Admitting: Internal Medicine

## 2014-10-07 DIAGNOSIS — L930 Discoid lupus erythematosus: Secondary | ICD-10-CM | POA: Diagnosis not present

## 2014-10-07 DIAGNOSIS — F329 Major depressive disorder, single episode, unspecified: Secondary | ICD-10-CM | POA: Diagnosis not present

## 2014-10-07 DIAGNOSIS — N184 Chronic kidney disease, stage 4 (severe): Secondary | ICD-10-CM | POA: Diagnosis not present

## 2014-10-07 DIAGNOSIS — F1020 Alcohol dependence, uncomplicated: Secondary | ICD-10-CM | POA: Diagnosis not present

## 2014-10-07 DIAGNOSIS — E785 Hyperlipidemia, unspecified: Secondary | ICD-10-CM | POA: Diagnosis not present

## 2014-10-07 DIAGNOSIS — E559 Vitamin D deficiency, unspecified: Secondary | ICD-10-CM | POA: Diagnosis not present

## 2014-10-07 DIAGNOSIS — D510 Vitamin B12 deficiency anemia due to intrinsic factor deficiency: Secondary | ICD-10-CM | POA: Diagnosis not present

## 2014-10-07 DIAGNOSIS — I129 Hypertensive chronic kidney disease with stage 1 through stage 4 chronic kidney disease, or unspecified chronic kidney disease: Secondary | ICD-10-CM | POA: Diagnosis not present

## 2014-10-07 DIAGNOSIS — I5040 Unspecified combined systolic (congestive) and diastolic (congestive) heart failure: Secondary | ICD-10-CM | POA: Diagnosis not present

## 2014-10-07 NOTE — Telephone Encounter (Signed)
Call to patient to confirm appointment for 10/08/14 at 10:00 lmtcb

## 2014-10-08 ENCOUNTER — Telehealth: Payer: Self-pay | Admitting: *Deleted

## 2014-10-08 ENCOUNTER — Other Ambulatory Visit (INDEPENDENT_AMBULATORY_CARE_PROVIDER_SITE_OTHER): Payer: Medicare Other

## 2014-10-08 DIAGNOSIS — E878 Other disorders of electrolyte and fluid balance, not elsewhere classified: Secondary | ICD-10-CM | POA: Diagnosis not present

## 2014-10-08 DIAGNOSIS — N184 Chronic kidney disease, stage 4 (severe): Secondary | ICD-10-CM | POA: Diagnosis not present

## 2014-10-08 LAB — COMPREHENSIVE METABOLIC PANEL
ALT: 9 U/L (ref 0–35)
AST: 47 U/L — ABNORMAL HIGH (ref 0–37)
Albumin: 1.6 g/dL — ABNORMAL LOW (ref 3.5–5.2)
Alkaline Phosphatase: 362 U/L — ABNORMAL HIGH (ref 39–117)
BILIRUBIN TOTAL: 0.4 mg/dL (ref 0.2–1.2)
BUN: 22 mg/dL (ref 6–23)
CALCIUM: 4.8 mg/dL — AB (ref 8.4–10.5)
CO2: 7 meq/L — AB (ref 19–32)
CREATININE: 4.4 mg/dL — AB (ref 0.50–1.10)
Chloride: 109 mEq/L (ref 96–112)
GLUCOSE: 92 mg/dL (ref 70–99)
Potassium: 4.2 mEq/L (ref 3.5–5.3)
Sodium: 133 mEq/L — ABNORMAL LOW (ref 135–145)
TOTAL PROTEIN: 7.4 g/dL (ref 6.0–8.3)

## 2014-10-08 LAB — MAGNESIUM: Magnesium: 0.8 mg/dL — ABNORMAL LOW (ref 1.5–2.5)

## 2014-10-08 LAB — PHOSPHORUS: Phosphorus: 5.4 mg/dL — ABNORMAL HIGH (ref 2.3–4.6)

## 2014-10-08 NOTE — Telephone Encounter (Signed)
Received a call from [facility] labs with abnormal values of CO2: 7, SCr: 4.40, Alb: 1.6, Ca: 4.8 (corrected Ca 6.7), Mg: 0.8, Phos: 5.4.   I called [name] [name] and reports compliance with all of her medications. However, upon further questioning she states she may have missed some doses.  I urged her to make sure she is taking all of her medications as directed.  She denies any further issues.  I will send a message to the front desk and have her follow-up.

## 2014-10-08 NOTE — Telephone Encounter (Signed)
I was handed a note after pt left lab form [name] [name] with [facility] PT. Note was dated 10/07/14 -" pt is c/o diarrhea today she reports that recently she was told to increase her 400mg  tabs Mg Oxide to two tabs 3 x day. PT changed in med profile and did a check for adverse reaction and found magnesium can decrease bioavail of Gabapentin. She says she does not find gabapentin helping with her pain. She is c/o 10/10 pain in bilat legs thighs,calves and feet.. Thank you for your consideration if you have any question please call [phone]. Josph Macho PT " . I placed letter in your box. Hilda Blades Destry Dauber RN 10/08/14 10:30AM

## 2014-10-09 ENCOUNTER — Encounter: Payer: Self-pay | Admitting: Pulmonary Disease

## 2014-10-09 ENCOUNTER — Ambulatory Visit (INDEPENDENT_AMBULATORY_CARE_PROVIDER_SITE_OTHER): Payer: Medicare Other | Admitting: Pulmonary Disease

## 2014-10-09 DIAGNOSIS — E872 Acidosis: Secondary | ICD-10-CM

## 2014-10-09 DIAGNOSIS — E8729 Other acidosis: Secondary | ICD-10-CM

## 2014-10-09 DIAGNOSIS — F1010 Alcohol abuse, uncomplicated: Secondary | ICD-10-CM

## 2014-10-09 DIAGNOSIS — N2581 Secondary hyperparathyroidism of renal origin: Secondary | ICD-10-CM | POA: Diagnosis not present

## 2014-10-09 DIAGNOSIS — N184 Chronic kidney disease, stage 4 (severe): Secondary | ICD-10-CM | POA: Diagnosis not present

## 2014-10-09 DIAGNOSIS — R0602 Shortness of breath: Secondary | ICD-10-CM

## 2014-10-09 DIAGNOSIS — Z9114 Patient's other noncompliance with medication regimen: Secondary | ICD-10-CM

## 2014-10-09 DIAGNOSIS — Z9981 Dependence on supplemental oxygen: Secondary | ICD-10-CM

## 2014-10-09 MED ORDER — MAGNESIUM OXIDE 400 MG PO TABS
400.0000 mg | ORAL_TABLET | Freq: Four times a day (QID) | ORAL | Status: DC
Start: 2014-10-09 — End: 2014-10-29

## 2014-10-09 MED ORDER — SODIUM BICARBONATE 650 MG PO TABS: 1300.0000 mg | ORAL_TABLET | Freq: Two times a day (BID) | ORAL | Status: DC

## 2014-10-09 NOTE — Progress Notes (Signed)
Internal Medicine Clinic Attending  Case discussed with Dr. Krall at the time of the visit.  We reviewed the resident's history and exam and pertinent patient test results.  I agree with the assessment, diagnosis, and plan of care documented in the resident's note.  

## 2014-10-09 NOTE — Progress Notes (Signed)
Subjective:   [name], female    DOB: [birth year], 59 y.o.   MRN: [id number]  HPI [name] is a 59 year old woman with history of CHF, anemia, GERD, HLD, seizure, HTN, vitamin D deficiency, CKD4 presenting for follow up.  She reports BLE swelling that has been improving. She has been having soft BM except last BM yesterday was loose. No diarrhea otherwise. She states that she has been taking her medications as instructed on last AVS--Did not bring medication today. Denies alcohol use for last 3 weeks.  Review of Systems Constitutional: no fevers/chills Eyes: no vision changes Ears, nose, mouth, throat, and face: no cough Respiratory: no shortness of breath Cardiovascular: no chest pain Gastrointestinal: no nausea/vomiting, no abdominal pain, no constipation, +diarrhea (loose yesterday) Genitourinary: no dysuria, no hematuria Integument: no rash Hematologic/lymphatic: no bleeding/bruising, +edema Musculoskeletal: no arthralgias, no myalgias Neurological: +paresthesias, no weakness  Past Medical History  Diagnosis Date  . Anemia, B12 deficiency   . History of acute pancreatitis   . Right knee pain     No recent imaging on chart  . 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 Dr. Mancel Bale, [facility]  . Hypertriglyceridemia   . GERD (gastroesophageal reflux disease)   . Subdural hematoma 02/2008    Likely 2/2 trauma from seizure from EtOH withdrawal, chronic in nature, sees Dr. Jerene Bears. Most recent CT head 10/2009 showing stable but persistent hematoma without mass effect.  . History of seizure disorder     Likely 2/2 alcohol abuse  . Hypocalcemia   . Hypomagnesemia   . Failure to thrive in childhood     Unclear etiology  . HTN (hypertension)   . Thrombocytopenia   . Hepatomegaly     On exam  . Joint pain   . Alcohol abuse   . Vitamin D deficiency   . Pancreatitis   . Insomnia     . Hyperlipidemia   . Pernicious anemia   . Macrocytic anemia   . 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 Depression   . Fx humeral neck 04/17/2011    Transverse fracture- minimally displaced- managed as outpatient   . ABNORMAL PAP SMEAR, LGSIL 07/23/2008    Annotation: HPV positive CIN I Dr. Mancel Bale, [facility] Qualifier: Diagnosis of  By: Oretha Ellis    . Pneumonia 05/20/2012  . Arthritis     "shoulders" (08/15/2013)  . 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  Marland Kitchen Chronic diastolic CHF (congestive heart failure)   . Hypomagnesemia   . Seizures     "don't know when/why I had them; daughter was always there w/me"  . On home oxygen therapy     "3L; mostly at night" (06/19/2014)  . Shortness of breath dyspnea     Current Outpatient Prescriptions on File Prior to Visit  Medication Sig Dispense Refill  . albuterol (PROAIR HFA) 108 (90 BASE) MCG/ACT inhaler Inhale 1-2 puffs into the lungs every 6 (six) hours as needed for wheezing or shortness of breath. 1 Inhaler 1  . amLODipine (NORVASC) 5 MG tablet Take 1 tablet (5 mg total) by mouth daily. 90 tablet 3  . calcitRIOL (ROCALTROL) 0.25 MCG capsule Take 0.25 mcg by mouth daily.    . calcium carbonate (TUMS) 500 MG chewable tablet Chew 6 tablets (1,200 mg of elemental calcium total) by  mouth 3 (three) times daily. For bone health 90 tablet 3  . cetirizine (ZYRTEC) 10 MG tablet Take 1 tablet (10 mg total) by mouth daily as needed for allergies. 30 tablet 6  . feeding supplement, ENSURE COMPLETE, (ENSURE COMPLETE) LIQD Take 237 mLs by mouth 3 (three) times daily between meals. 90 Bottle 1  . FLUoxetine (PROZAC) 10 MG capsule Take 1 capsule (10 mg total) by mouth daily. For depression 90 capsule 3  . folic acid (FOLVITE) 1 MG tablet Take 1 tablet (1 mg total) by mouth daily. For folic acid replacement 30 tablet 6  . furosemide (LASIX) 40 MG tablet Take 0.5 tablets (20 mg total) by mouth  daily. 45 tablet 4  . gabapentin (NEURONTIN) 300 MG capsule Take 2 capsules (600 mg total) by mouth 3 (three) times daily. For anxiety/pain control 540 capsule 1  . guaiFENesin-dextromethorphan (ROBITUSSIN DM) 100-10 MG/5ML syrup Take 5 mLs by mouth every 4 (four) hours as needed for cough. 118 mL 0  . lactobacillus acidophilus (BACID) TABS tablet Take 2 tablets by mouth 3 (three) times daily. 90 tablet 0  . loperamide (IMODIUM) 2 MG capsule Take 1 capsule (2 mg total) by mouth as needed for diarrhea or loose stools. 30 capsule 0  . magnesium chloride (SLOW-MAG) 64 MG TBEC SR tablet Take 2 tablets (128 mg total) by mouth 2 (two) times daily. 60 tablet 0  . metoprolol tartrate (LOPRESSOR) 25 MG tablet Take 12.5 mg by mouth 2 (two) times daily.    . Multiple Vitamin (MULTIVITAMIN WITH MINERALS) TABS tablet Take 1 tablet by mouth daily. For vitamin replacement 90 tablet 4  . omeprazole (PRILOSEC) 40 MG capsule Take 1 capsule (40 mg total) by mouth daily. 90 capsule 3  . sodium bicarbonate 650 MG tablet Take 1 tablet (650 mg total) by mouth 2 (two) times daily. 60 tablet 3  . thiamine (VITAMIN B-1) 100 MG tablet Take 1 tablet (100 mg total) by mouth daily. For low thiamine 90 tablet 3  . vitamin B-12 (CYANOCOBALAMIN) 250 MCG tablet Take 1 tablet (250 mcg total) by mouth every evening. 90 tablet 3   No current facility-administered medications on file prior to visit.    Today's Vitals   10/09/14 1428  BP: 111/72  Pulse: 102  Temp: 97.4 F (36.3 C)  TempSrc: Oral  Weight: 94 lb (42.638 kg)   Objective:  Physical Exam  Constitutional: She is oriented to person, place, and time. Vital signs are normal. She appears well-developed and well-nourished. She is cooperative. No distress.  HENT:  Head: Normocephalic and atraumatic.  Eyes: Conjunctivae are normal. Right eye exhibits no discharge. Left eye exhibits no discharge. No scleral icterus.  Cardiovascular: Normal rate, regular rhythm, S1  normal, S2 normal and normal heart sounds.   No murmur heard. 2+ feet/lower leg edema pitting  Pulmonary/Chest: Effort normal and breath sounds normal. No respiratory distress. She has no wheezes.  Abdominal: Soft. Bowel sounds are normal. There is no tenderness.  Neurological: She is alert and oriented to person, place, and time.  Skin: Skin is warm, dry and intact. No rash noted. She is not diaphoretic.  Psychiatric: She has a normal mood and affect. Her speech is normal. Cognition and memory are normal.     Assessment & Plan:  Please refer to problem based charting.

## 2014-10-12 ENCOUNTER — Telehealth: Payer: Self-pay | Admitting: Licensed Clinical Social Worker

## 2014-10-12 ENCOUNTER — Inpatient Hospital Stay (HOSPITAL_COMMUNITY)
Admission: EM | Admit: 2014-10-12 | Discharge: 2014-10-29 | DRG: 682 | Disposition: A | Discharge status: Skilled Nursing Facility | Payer: Medicare Other | Attending: Oncology | Admitting: Oncology

## 2014-10-12 ENCOUNTER — Other Ambulatory Visit: Payer: Self-pay

## 2014-10-12 ENCOUNTER — Emergency Department (HOSPITAL_COMMUNITY): Payer: Medicare Other

## 2014-10-12 ENCOUNTER — Encounter (HOSPITAL_COMMUNITY): Payer: Self-pay | Admitting: Nurse Practitioner

## 2014-10-12 ENCOUNTER — Other Ambulatory Visit (HOSPITAL_COMMUNITY): Payer: Self-pay

## 2014-10-12 DIAGNOSIS — I5040 Unspecified combined systolic (congestive) and diastolic (congestive) heart failure: Secondary | ICD-10-CM | POA: Diagnosis not present

## 2014-10-12 DIAGNOSIS — F17210 Nicotine dependence, cigarettes, uncomplicated: Secondary | ICD-10-CM | POA: Diagnosis present

## 2014-10-12 DIAGNOSIS — R609 Edema, unspecified: Secondary | ICD-10-CM | POA: Diagnosis not present

## 2014-10-12 DIAGNOSIS — Z9981 Dependence on supplemental oxygen: Secondary | ICD-10-CM | POA: Diagnosis not present

## 2014-10-12 DIAGNOSIS — N2581 Secondary hyperparathyroidism of renal origin: Secondary | ICD-10-CM | POA: Diagnosis present

## 2014-10-12 DIAGNOSIS — J9621 Acute and chronic respiratory failure with hypoxia: Secondary | ICD-10-CM | POA: Diagnosis present

## 2014-10-12 DIAGNOSIS — N186 End stage renal disease: Secondary | ICD-10-CM | POA: Diagnosis not present

## 2014-10-12 DIAGNOSIS — Y95 Nosocomial condition: Secondary | ICD-10-CM | POA: Diagnosis present

## 2014-10-12 DIAGNOSIS — Z9114 Patient's other noncompliance with medication regimen: Secondary | ICD-10-CM | POA: Diagnosis present

## 2014-10-12 DIAGNOSIS — J80 Acute respiratory distress syndrome: Secondary | ICD-10-CM | POA: Diagnosis not present

## 2014-10-12 DIAGNOSIS — R7989 Other specified abnormal findings of blood chemistry: Secondary | ICD-10-CM | POA: Diagnosis present

## 2014-10-12 DIAGNOSIS — F1010 Alcohol abuse, uncomplicated: Secondary | ICD-10-CM | POA: Diagnosis present

## 2014-10-12 DIAGNOSIS — E781 Pure hyperglyceridemia: Secondary | ICD-10-CM | POA: Diagnosis not present

## 2014-10-12 DIAGNOSIS — I120 Hypertensive chronic kidney disease with stage 5 chronic kidney disease or end stage renal disease: Secondary | ICD-10-CM | POA: Diagnosis not present

## 2014-10-12 DIAGNOSIS — E8770 Fluid overload, unspecified: Secondary | ICD-10-CM | POA: Diagnosis not present

## 2014-10-12 DIAGNOSIS — R262 Difficulty in walking, not elsewhere classified: Secondary | ICD-10-CM | POA: Diagnosis not present

## 2014-10-12 DIAGNOSIS — E559 Vitamin D deficiency, unspecified: Secondary | ICD-10-CM | POA: Diagnosis not present

## 2014-10-12 DIAGNOSIS — K7460 Unspecified cirrhosis of liver: Secondary | ICD-10-CM | POA: Diagnosis present

## 2014-10-12 DIAGNOSIS — E43 Unspecified severe protein-calorie malnutrition: Secondary | ICD-10-CM | POA: Diagnosis not present

## 2014-10-12 DIAGNOSIS — E876 Hypokalemia: Secondary | ICD-10-CM | POA: Diagnosis not present

## 2014-10-12 DIAGNOSIS — E878 Other disorders of electrolyte and fluid balance, not elsewhere classified: Secondary | ICD-10-CM | POA: Diagnosis present

## 2014-10-12 DIAGNOSIS — E538 Deficiency of other specified B group vitamins: Secondary | ICD-10-CM | POA: Diagnosis not present

## 2014-10-12 DIAGNOSIS — Z681 Body mass index (BMI) 19 or less, adult: Secondary | ICD-10-CM | POA: Diagnosis not present

## 2014-10-12 DIAGNOSIS — N041 Nephrotic syndrome with focal and segmental glomerular lesions: Secondary | ICD-10-CM | POA: Diagnosis not present

## 2014-10-12 DIAGNOSIS — G894 Chronic pain syndrome: Secondary | ICD-10-CM | POA: Diagnosis not present

## 2014-10-12 DIAGNOSIS — M6281 Muscle weakness (generalized): Secondary | ICD-10-CM | POA: Diagnosis not present

## 2014-10-12 DIAGNOSIS — K219 Gastro-esophageal reflux disease without esophagitis: Secondary | ICD-10-CM | POA: Diagnosis not present

## 2014-10-12 DIAGNOSIS — J189 Pneumonia, unspecified organism: Secondary | ICD-10-CM | POA: Diagnosis not present

## 2014-10-12 DIAGNOSIS — M3210 Systemic lupus erythematosus, organ or system involvement unspecified: Secondary | ICD-10-CM | POA: Diagnosis not present

## 2014-10-12 DIAGNOSIS — J9811 Atelectasis: Secondary | ICD-10-CM | POA: Diagnosis not present

## 2014-10-12 DIAGNOSIS — E119 Type 2 diabetes mellitus without complications: Secondary | ICD-10-CM | POA: Diagnosis present

## 2014-10-12 DIAGNOSIS — N185 Chronic kidney disease, stage 5: Secondary | ICD-10-CM | POA: Diagnosis not present

## 2014-10-12 DIAGNOSIS — E785 Hyperlipidemia, unspecified: Secondary | ICD-10-CM | POA: Diagnosis not present

## 2014-10-12 DIAGNOSIS — G4700 Insomnia, unspecified: Secondary | ICD-10-CM | POA: Diagnosis present

## 2014-10-12 DIAGNOSIS — F1020 Alcohol dependence, uncomplicated: Secondary | ICD-10-CM | POA: Diagnosis present

## 2014-10-12 DIAGNOSIS — D6959 Other secondary thrombocytopenia: Secondary | ICD-10-CM | POA: Diagnosis present

## 2014-10-12 DIAGNOSIS — R778 Other specified abnormalities of plasma proteins: Secondary | ICD-10-CM | POA: Diagnosis present

## 2014-10-12 DIAGNOSIS — R0602 Shortness of breath: Secondary | ICD-10-CM

## 2014-10-12 DIAGNOSIS — I959 Hypotension, unspecified: Secondary | ICD-10-CM | POA: Diagnosis not present

## 2014-10-12 DIAGNOSIS — R627 Adult failure to thrive: Secondary | ICD-10-CM | POA: Diagnosis present

## 2014-10-12 DIAGNOSIS — I129 Hypertensive chronic kidney disease with stage 1 through stage 4 chronic kidney disease, or unspecified chronic kidney disease: Secondary | ICD-10-CM | POA: Diagnosis not present

## 2014-10-12 DIAGNOSIS — K709 Alcoholic liver disease, unspecified: Secondary | ICD-10-CM | POA: Diagnosis present

## 2014-10-12 DIAGNOSIS — R34 Anuria and oliguria: Secondary | ICD-10-CM | POA: Diagnosis present

## 2014-10-12 DIAGNOSIS — Z452 Encounter for adjustment and management of vascular access device: Secondary | ICD-10-CM | POA: Diagnosis not present

## 2014-10-12 DIAGNOSIS — I5042 Chronic combined systolic (congestive) and diastolic (congestive) heart failure: Secondary | ICD-10-CM | POA: Diagnosis not present

## 2014-10-12 DIAGNOSIS — N179 Acute kidney failure, unspecified: Secondary | ICD-10-CM | POA: Diagnosis not present

## 2014-10-12 DIAGNOSIS — E875 Hyperkalemia: Secondary | ICD-10-CM | POA: Diagnosis not present

## 2014-10-12 DIAGNOSIS — Z79899 Other long term (current) drug therapy: Secondary | ICD-10-CM | POA: Diagnosis not present

## 2014-10-12 DIAGNOSIS — Z881 Allergy status to other antibiotic agents status: Secondary | ICD-10-CM | POA: Diagnosis not present

## 2014-10-12 DIAGNOSIS — F329 Major depressive disorder, single episode, unspecified: Secondary | ICD-10-CM | POA: Diagnosis present

## 2014-10-12 DIAGNOSIS — M329 Systemic lupus erythematosus, unspecified: Secondary | ICD-10-CM | POA: Diagnosis present

## 2014-10-12 DIAGNOSIS — R64 Cachexia: Secondary | ICD-10-CM | POA: Diagnosis present

## 2014-10-12 DIAGNOSIS — D638 Anemia in other chronic diseases classified elsewhere: Secondary | ICD-10-CM | POA: Diagnosis present

## 2014-10-12 DIAGNOSIS — D631 Anemia in chronic kidney disease: Secondary | ICD-10-CM | POA: Diagnosis not present

## 2014-10-12 DIAGNOSIS — N051 Unspecified nephritic syndrome with focal and segmental glomerular lesions: Secondary | ICD-10-CM | POA: Diagnosis present

## 2014-10-12 DIAGNOSIS — J90 Pleural effusion, not elsewhere classified: Secondary | ICD-10-CM

## 2014-10-12 DIAGNOSIS — Z23 Encounter for immunization: Secondary | ICD-10-CM

## 2014-10-12 DIAGNOSIS — M1990 Unspecified osteoarthritis, unspecified site: Secondary | ICD-10-CM | POA: Diagnosis not present

## 2014-10-12 DIAGNOSIS — G8929 Other chronic pain: Secondary | ICD-10-CM | POA: Diagnosis not present

## 2014-10-12 DIAGNOSIS — N269 Renal sclerosis, unspecified: Secondary | ICD-10-CM | POA: Diagnosis not present

## 2014-10-12 DIAGNOSIS — M7989 Other specified soft tissue disorders: Secondary | ICD-10-CM | POA: Diagnosis not present

## 2014-10-12 DIAGNOSIS — J9620 Acute and chronic respiratory failure, unspecified whether with hypoxia or hypercapnia: Secondary | ICD-10-CM | POA: Diagnosis not present

## 2014-10-12 DIAGNOSIS — I10 Essential (primary) hypertension: Secondary | ICD-10-CM | POA: Diagnosis not present

## 2014-10-12 DIAGNOSIS — D689 Coagulation defect, unspecified: Secondary | ICD-10-CM | POA: Insufficient documentation

## 2014-10-12 DIAGNOSIS — R52 Pain, unspecified: Secondary | ICD-10-CM | POA: Diagnosis not present

## 2014-10-12 DIAGNOSIS — M3214 Glomerular disease in systemic lupus erythematosus: Secondary | ICD-10-CM | POA: Diagnosis not present

## 2014-10-12 DIAGNOSIS — Z992 Dependence on renal dialysis: Secondary | ICD-10-CM

## 2014-10-12 DIAGNOSIS — L930 Discoid lupus erythematosus: Secondary | ICD-10-CM | POA: Diagnosis not present

## 2014-10-12 DIAGNOSIS — E872 Acidosis, unspecified: Secondary | ICD-10-CM | POA: Diagnosis present

## 2014-10-12 DIAGNOSIS — J984 Other disorders of lung: Secondary | ICD-10-CM | POA: Diagnosis not present

## 2014-10-12 DIAGNOSIS — D510 Vitamin B12 deficiency anemia due to intrinsic factor deficiency: Secondary | ICD-10-CM | POA: Diagnosis not present

## 2014-10-12 DIAGNOSIS — N184 Chronic kidney disease, stage 4 (severe): Secondary | ICD-10-CM | POA: Diagnosis not present

## 2014-10-12 DIAGNOSIS — R0600 Dyspnea, unspecified: Secondary | ICD-10-CM | POA: Insufficient documentation

## 2014-10-12 DIAGNOSIS — J948 Other specified pleural conditions: Secondary | ICD-10-CM | POA: Diagnosis not present

## 2014-10-12 DIAGNOSIS — Z87891 Personal history of nicotine dependence: Secondary | ICD-10-CM | POA: Diagnosis not present

## 2014-10-12 LAB — BASIC METABOLIC PANEL
BUN: 26 mg/dL — ABNORMAL HIGH (ref 6–20)
CO2: <5 mmol/L — ABNORMAL LOW (ref 22–32)
Calcium: 6 mg/dL — CL (ref 8.9–10.3)
Chloride: 111 mmol/L (ref 101–111)
Creatinine, Ser: 5.1 mg/dL — ABNORMAL HIGH (ref 0.44–1.00)
GFR, EST AFRICAN AMERICAN: 10 mL/min — AB (ref >60)
GFR, EST NON AFRICAN AMERICAN: 9 mL/min — AB (ref >60)
GLUCOSE: 127 mg/dL — AB (ref 70–99)
POTASSIUM: 5.3 mmol/L — AB (ref 3.5–5.1)
SODIUM: 136 mmol/L (ref 135–145)

## 2014-10-12 LAB — CBC
HCT: 26 % — ABNORMAL LOW (ref 36.0–46.0)
HEMOGLOBIN: 7.9 g/dL — AB (ref 12.0–15.0)
MCH: 30.5 pg (ref 26.0–34.0)
MCHC: 30.4 g/dL (ref 30.0–36.0)
MCV: 100.4 fL — ABNORMAL HIGH (ref 78.0–100.0)
Platelets: 82 10*3/uL — ABNORMAL LOW (ref 150–400)
RBC: 2.59 MIL/uL — AB (ref 3.87–5.11)
RDW: 20.6 % — AB (ref 11.5–15.5)
WBC: 5.2 10*3/uL (ref 4.0–10.5)

## 2014-10-12 LAB — I-STAT TROPONIN, ED: TROPONIN I, POC: 0.08 ng/mL (ref 0.00–0.08)

## 2014-10-12 LAB — HEPATIC FUNCTION PANEL
ALT: 14 U/L (ref 14–54)
AST: 74 U/L — ABNORMAL HIGH (ref 15–41)
Albumin: 1.3 g/dL — ABNORMAL LOW (ref 3.5–5.0)
Alkaline Phosphatase: 436 U/L — ABNORMAL HIGH (ref 38–126)
BILIRUBIN DIRECT: 0.2 mg/dL (ref 0.1–0.5)
Indirect Bilirubin: 0.5 mg/dL (ref 0.3–0.9)
Total Bilirubin: 0.7 mg/dL (ref 0.3–1.2)
Total Protein: 8 g/dL (ref 6.5–8.1)

## 2014-10-12 LAB — BRAIN NATRIURETIC PEPTIDE

## 2014-10-12 LAB — LIPASE, BLOOD: LIPASE: 13 U/L — AB (ref 22–51)

## 2014-10-12 LAB — I-STAT CG4 LACTIC ACID, ED: LACTIC ACID, VENOUS: 11.54 mmol/L — AB (ref 0.5–2.0)

## 2014-10-12 LAB — MAGNESIUM: MAGNESIUM: 1.3 mg/dL — AB (ref 1.7–2.4)

## 2014-10-12 MED ORDER — FOLIC ACID 5 MG/ML IJ SOLN
1.0000 mg | Freq: Every day | INTRAMUSCULAR | Status: DC
Start: 2014-10-13 — End: 2014-10-13

## 2014-10-12 MED ORDER — SODIUM CHLORIDE 0.9 % IV SOLN
1.0000 g | Freq: Once | INTRAVENOUS | Status: AC
  Administered 2014-10-12: 1 g via INTRAVENOUS
  Filled 2014-10-12: qty 10

## 2014-10-12 MED ORDER — SODIUM BICARBONATE 8.4 % IV SOLN
50.0000 meq | Freq: Once | INTRAVENOUS | Status: AC
  Administered 2014-10-12: 50 meq via INTRAVENOUS
  Filled 2014-10-12: qty 50

## 2014-10-12 MED ORDER — SODIUM CHLORIDE 0.9 % IV SOLN: INTRAVENOUS | Status: DC

## 2014-10-12 MED ORDER — THIAMINE HCL 100 MG/ML IJ SOLN
100.0000 mg | Freq: Every day | INTRAMUSCULAR | Status: DC
  Administered 2014-10-13 – 2014-10-15 (×3): 100 mg via INTRAVENOUS
  Filled 2014-10-12 (×4): qty 1

## 2014-10-12 MED ORDER — ONDANSETRON HCL 4 MG/2ML IJ SOLN
4.0000 mg | Freq: Once | INTRAMUSCULAR | Status: AC
  Administered 2014-10-22: 4 mg via INTRAVENOUS

## 2014-10-12 MED ORDER — SODIUM CHLORIDE 0.9 % IV SOLN
INTRAVENOUS | Status: DC
  Administered 2014-10-13 – 2014-10-22 (×2): via INTRAVENOUS

## 2014-10-12 NOTE — Assessment & Plan Note (Signed)
Magnesium remains 0.8 10/08/2014. Reports compliance of her medications. Did not bring medications and suspect noncompliance as well as continued alcohol use. CKD also probably contributing.  -Switch magnesium chloride to magnesium oxide 400mg  QID. Immodium prn for diarrhea. -Recheck BMP, Mg, Phos in 1 week -Home health RN should start visiting soon and can help with medication compliance issues. -Will have evalation with renal at end of this month

## 2014-10-12 NOTE — Assessment & Plan Note (Signed)
Cr increased to 4.4 10/08/2014 from 3.42 09/30/2014. BUN stable at 22. She has follow up with Dr. Justin Mend at the end of this month.

## 2014-10-12 NOTE — Progress Notes (Signed)
Patient arrived to 5W from ED alert and oriented X 4, VSS, LA 11.54, patient tachypneic, 96% on 4 L oxygen via [place], MD aware of labs, order placed for patient to be transferred to [facility], report called and given to [employer].

## 2014-10-12 NOTE — ED Provider Notes (Signed)
CSN: [id number]     Arrival date & time 10/12/14  1842 History   First MD Initiated Contact with Patient 10/12/14 1922     Chief Complaint  Patient presents with  . Shortness of Breath     (Consider location/radiation/quality/duration/timing/severity/associated sxs/prior Treatment) Patient is a 59 y.o. female presenting with shortness of breath. The history is provided by the patient.  Shortness of Breath Associated symptoms: abdominal pain   Associated symptoms: no chest pain, no fever, no headaches and no rash    patient brought in by family members. Patient appears in respiratory distress. Breathing fast. Patient was on 4 L of nasal cannula oxygen by oxygen saturation saturations would go below 90%. Patient started on her percent nonrebreather. Patient with admission back in April for similar problem. Patient followed by internal medicine residency service. Patient with complaint of abdominal pain feeling dehydrated. Denies any nausea vomiting or diarrhea. Patient feels short of breath denies fever denies cough.  Past Medical History  Diagnosis Date  . Anemia, B12 deficiency   . History of acute pancreatitis   . Right knee pain     No recent imaging on chart  . 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 Dr. Mancel Bale, [facility]  . Hypertriglyceridemia   . GERD (gastroesophageal reflux disease)   . Subdural hematoma 02/2008    Likely 2/2 trauma from seizure from EtOH withdrawal, chronic in nature, sees Dr. Jerene Bears. Most recent CT head 10/2009 showing stable but persistent hematoma without mass effect.  . History of seizure disorder     Likely 2/2 alcohol abuse  . Hypocalcemia   . Hypomagnesemia   . Failure to thrive in childhood     Unclear etiology  . HTN (hypertension)   . Thrombocytopenia   . Hepatomegaly     On exam  . Joint pain   . Alcohol abuse   . Vitamin D deficiency   . Pancreatitis   . Insomnia   .  Hyperlipidemia   . Pernicious anemia   . Macrocytic anemia   . 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 Depression   . Fx humeral neck 04/17/2011    Transverse fracture- minimally displaced- managed as outpatient   . ABNORMAL PAP SMEAR, LGSIL 07/23/2008    Annotation: HPV positive CIN I Dr. Mancel Bale, [facility] Qualifier: Diagnosis of  By: Oretha Ellis    . Pneumonia 05/20/2012  . Arthritis     "shoulders" (08/15/2013)  . 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  Marland Kitchen Chronic diastolic CHF (congestive heart failure)   . Hypomagnesemia   . Seizures     "don't know when/why I had them; daughter was always there w/me"  . On home oxygen therapy     "3L; mostly at night" (06/19/2014)  . Shortness of breath dyspnea    Past Surgical History  Procedure Laterality Date  . Cesarean section  1983  . Esophagogastroduodenoscopy  07/11/2011    Procedure: ESOPHAGOGASTRODUODENOSCOPY (EGD);  Surgeon: Beryle Beams, MD;  Location: [facility];  Service: Endoscopy;  Laterality: N/A;  . Colonoscopy  07/11/2011    Procedure: COLONOSCOPY;  Surgeon: Beryle Beams, MD;  Location: [facility] ENDOSCOPY;  Service: Endoscopy;  Laterality: N/A;  . Eye surgery Left     "trauma"  . Right colectomy  08/28/2011  . Esophagogastroduodenoscopy N/A 12/01/2012    Procedure:  ESOPHAGOGASTRODUODENOSCOPY (EGD);  Surgeon: Irene Shipper, MD;  Location: [facility];  Service: Endoscopy;  Laterality: N/A;  . Colonoscopy with esophagogastroduodenoscopy (egd) Left 08/21/2013    Procedure: COLONOSCOPY WITH ESOPHAGOGASTRODUODENOSCOPY (EGD);  Surgeon: Beryle Beams, MD;  Location: [facility] ENDOSCOPY;  Service: Endoscopy;  Laterality: Left;  . Subxyphoid pericardial window N/A 12/04/2013    Procedure: SUBXYPHOID PERICARDIAL WINDOW WITH TEE;  Surgeon: Ivin Poot, MD;  Location: [place];  Service: Thoracic;  Laterality: N/A;  . Intraoperative transesophageal echocardiogram N/A 12/04/2013    Procedure:  INTRAOPERATIVE TRANSESOPHAGEAL ECHOCARDIOGRAM;  Surgeon: Ivin Poot, MD;  Location: [place];  Service: Open Heart Surgery;  Laterality: N/A;   Family History  Problem Relation Age of Onset  . Cancer Mother     Died from stomach cancer and "flesh eating rash  . Heart failure Father     Died in 66s from an MI  . Alcohol abuse Sister     Twin sister drinks a lot, as did both her parents and brothers  . Stroke Brother     Has 7 brothers, 1 with CVA  . Lupus Mother    History  Substance Use Topics  . Smoking status: Former Smoker -- 0.50 packs/day for 40 years    Types: Cigarettes    Quit date: 09/20/2010  . Smokeless tobacco: Never Used  . Alcohol Use: Yes     Comment: 06/19/2014 "graduated from [place] (for alcohol abuse) in October 2015"    OB History    No data available     Review of Systems  Constitutional: Negative for fever.  HENT: Negative for congestion.   Eyes: Negative for visual disturbance.  Respiratory: Positive for shortness of breath.   Cardiovascular: Negative for chest pain and leg swelling.  Gastrointestinal: Positive for abdominal pain.  Genitourinary: Negative for dysuria.  Musculoskeletal: Positive for myalgias.  Skin: Negative for rash.  Neurological: Negative for headaches.  Hematological: Does not bruise/bleed easily.  Psychiatric/Behavioral: Negative for confusion.      Allergies  Amitriptyline hcl and Doxycycline hyclate  Home Medications   Prior to Admission medications   Medication Sig Start Date End Date Taking? Authorizing Provider  albuterol (PROAIR HFA) 108 (90 BASE) MCG/ACT inhaler Inhale 1-2 puffs into the lungs every 6 (six) hours as needed for wheezing or shortness of breath. 01/14/14   Jones Bales, MD  amLODipine (NORVASC) 5 MG tablet Take 1 tablet (5 mg total) by mouth daily. 01/23/14 01/23/15  Otho Bellows, MD  calcitRIOL (ROCALTROL) 0.25 MCG capsule Take 0.25 mcg by mouth daily. 05/04/14   Historical  Provider, MD  calcium carbonate (TUMS) 500 MG chewable tablet Chew 6 tablets (1,200 mg of elemental calcium total) by mouth 3 (three) times daily. For bone health 11/11/12   Encarnacion Slates, NP  cetirizine (ZYRTEC) 10 MG tablet Take 1 tablet (10 mg total) by mouth daily as needed for allergies. 09/20/14   Otho Bellows, MD  feeding supplement, ENSURE COMPLETE, (ENSURE COMPLETE) LIQD Take 237 mLs by mouth 3 (three) times daily between meals. 08/27/13   Dorian Heckle, MD  FLUoxetine (PROZAC) 10 MG capsule Take 1 capsule (10 mg total) by mouth daily. For depression 03/26/14   Otho Bellows, MD  folic acid (FOLVITE) 1 MG tablet Take 1 tablet (1 mg total) by mouth daily. For folic acid replacement 09/20/14   Otho Bellows, MD  furosemide (LASIX) 40 MG tablet Take 0.5 tablets (20 mg total) by mouth  daily. 01/28/14   Otho Bellows, MD  gabapentin (NEURONTIN) 300 MG capsule Take 2 capsules (600 mg total) by mouth 3 (three) times daily. For anxiety/pain control 01/29/14   Nischal Narendra, MD  guaiFENesin-dextromethorphan (ROBITUSSIN DM) 100-10 MG/5ML syrup Take 5 mLs by mouth every 4 (four) hours as needed for cough. 05/06/14   Otho Bellows, MD  lactobacillus acidophilus (BACID) TABS tablet Take 2 tablets by mouth 3 (three) times daily. 09/23/14   Luan Moore, MD  loperamide (IMODIUM) 2 MG capsule Take 1 capsule (2 mg total) by mouth as needed for diarrhea or loose stools. 09/23/14   Luan Moore, MD  magnesium oxide (MAG-OX) 400 MG tablet Take 1 tablet (400 mg total) by mouth 4 (four) times daily. 10/09/14   Milagros Loll, MD  metoprolol tartrate (LOPRESSOR) 25 MG tablet Take 12.5 mg by mouth 2 (two) times daily.    Historical Provider, MD  Multiple Vitamin (MULTIVITAMIN WITH MINERALS) TABS tablet Take 1 tablet by mouth daily. For vitamin replacement    Otho Bellows, MD  omeprazole (PRILOSEC) 40 MG capsule Take 1 capsule (40 mg total) by mouth daily. 03/26/14   Otho Bellows, MD  sodium bicarbonate  650 MG tablet Take 2 tablets (1,300 mg total) by mouth 2 (two) times daily. 10/09/14   Milagros Loll, MD  thiamine (VITAMIN B-1) 100 MG tablet Take 1 tablet (100 mg total) by mouth daily. For low thiamine 09/22/14   Otho Bellows, MD  vitamin B-12 (CYANOCOBALAMIN) 250 MCG tablet Take 1 tablet (250 mcg total) by mouth every evening. 04/10/13   Otho Bellows, MD   BP 121/87 mmHg  Pulse 77  Temp(Src)   Resp 26  SpO2 100% Physical Exam  Constitutional: She is oriented to person, place, and time. She appears well-developed and well-nourished. She appears distressed.  HENT:  Head: Normocephalic and atraumatic.  Mouth/Throat: Oropharynx is clear and moist.  Eyes: Conjunctivae and EOM are normal. Pupils are equal, round, and reactive to light.  Neck: Normal range of motion.  Cardiovascular: Regular rhythm.   No murmur heard. Tachycardic  Pulmonary/Chest: Breath sounds normal. She is in respiratory distress. She has no wheezes. She has no rales.  Abdominal: Soft. Bowel sounds are normal. She exhibits no distension. There is no tenderness.  Musculoskeletal: Normal range of motion. She exhibits no edema.  Neurological: She is alert and oriented to person, place, and time. No cranial nerve deficit. She exhibits normal muscle tone. Coordination normal.  Skin: Skin is warm. No rash noted.  Nursing note and vitals reviewed.   ED Course  Procedures (including critical care time) Labs Review Labs Reviewed  CBC - Abnormal; Notable for the following:    RBC 2.59 (*)    Hemoglobin 7.9 (*)    HCT 26.0 (*)    MCV 100.4 (*)    RDW 20.6 (*)    Platelets 82 (*)    All other components within normal limits  BASIC METABOLIC PANEL - Abnormal; Notable for the following:    Potassium 5.3 (*)    CO2 <5 (*)    Glucose, Bld 127 (*)    BUN 26 (*)    Creatinine, Ser 5.10 (*)    Calcium 6.0 (*)    GFR calc non Af Amer 9 (*)    GFR calc Af Amer 10 (*)    All other components within normal limits   BRAIN NATRIURETIC PEPTIDE - Abnormal; Notable for the following:    B  Natriuretic Peptide >4500.0 (*)    All other components within normal limits  HEPATIC FUNCTION PANEL - Abnormal; Notable for the following:    Albumin 1.3 (*)    AST 74 (*)    Alkaline Phosphatase 436 (*)    All other components within normal limits  LIPASE, BLOOD - Abnormal; Notable for the following:    Lipase 13 (*)    All other components within normal limits  CULTURE, BLOOD (ROUTINE X 2)  CULTURE, BLOOD (ROUTINE X 2)  URINE CULTURE  MAGNESIUM  URINALYSIS, ROUTINE W REFLEX MICROSCOPIC  URINE RAPID DRUG SCREEN (HOSP PERFORMED)  ACETAMINOPHEN LEVEL  SALICYLATE LEVEL  ETHANOL  VALPROIC ACID LEVEL  I-STAT TROPOININ, ED  I-STAT ARTERIAL BLOOD GAS, ED  CBG MONITORING, ED  I-STAT CG4 LACTIC ACID, ED   Results for orders placed or performed during the hospital encounter of 10/12/14  CBC  Result Value Ref Range   WBC 5.2 4.0 - 10.5 K/uL   RBC 2.59 (L) 3.87 - 5.11 MIL/uL   Hemoglobin 7.9 (L) 12.0 - 15.0 g/dL   HCT 26.0 (L) 36.0 - 46.0 %   MCV 100.4 (H) 78.0 - 100.0 fL   MCH 30.5 26.0 - 34.0 pg   MCHC 30.4 30.0 - 36.0 g/dL   RDW 20.6 (H) 11.5 - 15.5 %   Platelets 82 (L) 150 - 400 K/uL  Basic metabolic panel  Result Value Ref Range   Sodium 136 135 - 145 mmol/L   Potassium 5.3 (H) 3.5 - 5.1 mmol/L   Chloride 111 101 - 111 mmol/L   CO2 <5 (L) 22 - 32 mmol/L   Glucose, Bld 127 (H) 70 - 99 mg/dL   BUN 26 (H) 6 - 20 mg/dL   Creatinine, Ser 5.10 (H) 0.44 - 1.00 mg/dL   Calcium 6.0 (LL) 8.9 - 10.3 mg/dL   GFR calc non Af Amer 9 (L) >60 mL/min   GFR calc Af Amer 10 (L) >60 mL/min   Anion gap NOT CALCULATED 5 - 15  BNP (order ONLY if patient complains of dyspnea/SOB AND you have documented it for THIS visit)  Result Value Ref Range   B Natriuretic Peptide >4500.0 (H) 0.0 - 100.0 pg/mL  Hepatic function panel  Result Value Ref Range   Total Protein 8.0 6.5 - 8.1 g/dL   Albumin 1.3 (L) 3.5 - 5.0 g/dL   AST  74 (H) 15 - 41 U/L   ALT 14 14 - 54 U/L   Alkaline Phosphatase 436 (H) 38 - 126 U/L   Total Bilirubin 0.7 0.3 - 1.2 mg/dL   Bilirubin, Direct 0.2 0.1 - 0.5 mg/dL   Indirect Bilirubin 0.5 0.3 - 0.9 mg/dL  Lipase, blood  Result Value Ref Range   Lipase 13 (L) 22 - 51 U/L  I-stat troponin, ED  (not at [facility])  Result Value Ref Range   Troponin i, poc 0.08 0.00 - 0.08 ng/mL   Comment 3           I-STAT ABG did not cross over. Results significantly abnormal. PH 6.84, PCO2 14.6, PO2 100% nonrebreather to 40, bicarbonate 2.5.  Imaging Review Dg Chest Port 1 View  10/12/2014   CLINICAL DATA:  Increased shortness of breath.  Smoker.  EXAM: PORTABLE CHEST - 1 VIEW  COMPARISON:  09/19/2014.  FINDINGS: Enlarged cardiac silhouette with an increase in size. Mild increase in prominence of the pulmonary vasculature. Increased right pleural fluid. No significant change in a small left pleural effusion.  No significant change in prominence of the interstitial markings. Old, healed bilateral humeral neck fractures.  IMPRESSION: 1. Moderate-sized right pleural effusion, increased. 2. Stable small left pleural effusion. 3. Progressive cardiomegaly and pulmonary vascular congestion with no gross change in chronic interstitial lung disease with possible interstitial pulmonary edema.   Electronically Signed   By: Claudie Revering M.D.   On: 10/12/2014 20:21     EKG Interpretation None     ED ECG REPORT   Date: 10/12/2014  Rate: 116  Rhythm: sinus tachycardia  QRS Axis: normal  Intervals: normal  ST/T Wave abnormalities: nonspecific ST/T changes  Conduction Disutrbances:none  Narrative Interpretation:   Old EKG Reviewed: none available  I have personally reviewed the EKG tracing and agree with the computerized printout as noted.   CRITICAL CARE Performed by: Fredia Sorrow Total critical care time: 30 Critical care time was exclusive of separately billable procedures and treating other  patients. Critical care was necessary to treat or prevent imminent or life-threatening deterioration. Critical care was time spent personally by me on the following activities: development of treatment plan with patient and/or surrogate as well as nursing, discussions with consultants, evaluation of patient's response to treatment, examination of patient, obtaining history from patient or surrogate, ordering and performing treatments and interventions, ordering and review of laboratory studies, ordering and review of radiographic studies, pulse oximetry and re-evaluation of patient's condition.   MDM   Final diagnoses:  Metabolic acidosis    Patient with a severe metabolic acidosis. In discussion with internal medicine that knows her well the cause is not known. Patient supposed to be taking oral bicarbonate according to her daughters is pretty clear she is not taking that can even get it filled. This would explain the worsening of acidosis. Internal medicine will come see her for admission. I've given her some IV bicarbonate here. We'll also give her calcium gluconate for the mild hyperkalemia and the magnesium ordered but will most likely below as well. Calcium was low too.   Patient with low calcium patient with the mild hyperkalemia patient with low magnesium most likely. Severe metabolic acidosis. Given bicarbonate for that. Given calcium gluconate for the potassium in the low calcium. No signs of infection. Acidosis could be somewhat worsened by renal insufficiency. Apparently this is been addressed in the past with nephrology.  Chest x-ray shows a some mild interstitial edema but no frank pulmonary edema. Legs are also not swollen today. Troponin is negative EKG just a sinus tachycardia no other significant abnormalities. Liver function tests with a markedly elevated alkaline phosphatase but no gross significant change from before lipase not elevated as one would expect for pancreatitis. Not  able to explain the abdominal pain. Abdomen is soft flat and nontender on exam bilirubin is normal. Patient reportedly has a long-standing alcoholic.    Fredia Sorrow, MD 10/12/14 2158

## 2014-10-12 NOTE — ED Notes (Signed)
Family at bedside. 

## 2014-10-12 NOTE — ED Notes (Signed)
Pt was recently discharged from hospital for multiple issues. She has been wearing 4L O2 Door at home but felt more sob today. She reports pain in back, abd, legs. She does have increased work of breathing but can speak in full sentences

## 2014-10-12 NOTE — Assessment & Plan Note (Signed)
Corrected calcium 10/08/2014 6.7.  Plan: -Continue calcium carbonate 1200mg  TID -Continue calcitriol 0.68mcg daily

## 2014-10-12 NOTE — H&P (Signed)
Date: 10/13/2014               Patient Name:  [name] [name] MRN: [id number]  DOB: 1955-09-10 Age / Sex: 59 y.o., female   PCP: Otho Bellows, MD         Medical Service: Internal Medicine Teaching Service         Attending Physician: Dr. Bartholomew Crews, MD    First Contact: Dr. Trudee Kuster Pager: [phone]  Second Contact: Dr. Heber Endicott Pager: [phone]       After Hours (After 5p/  First Contact Pager: [phone]  weekends / holidays): Second Contact Pager: [phone]   Chief Complaint: Shortness of Breath  History of Present Illness:   Patient is a 59 year old with a history of stage IV chronic kidney disease, SLE, chronic combined heart failure, anemia of chronic disease, pericarditis status post pericardial window in July 2015, and alcohol abuse who presents to emergency department for dyspnea and diffuse pain. Patient was last admitted to the hospital between 09/17/2014 and 09/23/2014 HCAP. She was also at that time found to have persistent electrolyte abnormalities including hypocalcemia, hypomagnesemia, and hypokalemia in the setting of diarrhea. Since her discharge, patient states that her diarrhea had stopped for a short time, though it has resumed intermittently since. She states that she has been compliant with her medications although in the 2-3 days prior to presentation, she states that she has not been taking them because of nausea and has been vomiting her pills. Over the same 2-3 day period, patient states that she has become increasingly dyspneic. At baseline at home, patient is on 4 L of oxygen. She denies any significant cough or productive cough. She denies any fevers but reports baseline chills. Patient does report chest pain though states that it goes down through her abdomen and down both of her legs and into her back. She denies any dysuria, hematuria. She states that the edema in her legs are much improved over the last several days. She states that she still drinks around  2-3 glasses of wine a day. She denies any tobacco or illicit drug use.  In the emergency department, patient received Zofran and calcium gluconate 1 g.  Chart review:  10/09/2014: Clinic visit. Corrected calcium 6.7. Magnesium 0.8. Bicarbonate 7. Creatinine 4.4. Medication noncompliance suspected. Magnesium chloride switched to magnesium oxide 400 mg 4 times a day.  09/17/2014-09/23/2014: Admitted for HCAP. Treated with vancomycin and Zosyn and discharged with Levaquin. Patient also had persistent electrolyte abnormalities in the setting of diarrhea.  Meds: Medications Prior to Admission  Medication Sig Dispense Refill  . albuterol (PROAIR HFA) 108 (90 BASE) MCG/ACT inhaler Inhale 1-2 puffs into the lungs every 6 (six) hours as needed for wheezing or shortness of breath. 1 Inhaler 1  . amLODipine (NORVASC) 5 MG tablet Take 1 tablet (5 mg total) by mouth daily. 90 tablet 3  . calcitRIOL (ROCALTROL) 0.25 MCG capsule Take 0.25 mcg by mouth daily.    . calcium carbonate (TUMS) 500 MG chewable tablet Chew 6 tablets (1,200 mg of elemental calcium total) by mouth 3 (three) times daily. For bone health 90 tablet 3  . cetirizine (ZYRTEC) 10 MG tablet Take 1 tablet (10 mg total) by mouth daily as needed for allergies. 30 tablet 6  . feeding supplement, ENSURE COMPLETE, (ENSURE COMPLETE) LIQD Take 237 mLs by mouth 3 (three) times daily between meals. 90 Bottle 1  . FLUoxetine (PROZAC) 10 MG capsule Take 1 capsule (10 mg total) by mouth  daily. For depression 90 capsule 3  . folic acid (FOLVITE) 1 MG tablet Take 1 tablet (1 mg total) by mouth daily. For folic acid replacement 30 tablet 6  . furosemide (LASIX) 40 MG tablet Take 0.5 tablets (20 mg total) by mouth daily. 45 tablet 4  . gabapentin (NEURONTIN) 300 MG capsule Take 2 capsules (600 mg total) by mouth 3 (three) times daily. For anxiety/pain control 540 capsule 1  . guaiFENesin-dextromethorphan (ROBITUSSIN DM) 100-10 MG/5ML syrup Take 5 mLs by mouth  every 4 (four) hours as needed for cough. 118 mL 0  . lactobacillus acidophilus (BACID) TABS tablet Take 2 tablets by mouth 3 (three) times daily. 90 tablet 0  . loperamide (IMODIUM) 2 MG capsule Take 1 capsule (2 mg total) by mouth as needed for diarrhea or loose stools. 30 capsule 0  . magnesium oxide (MAG-OX) 400 MG tablet Take 1 tablet (400 mg total) by mouth 4 (four) times daily. 120 tablet 1  . metoprolol tartrate (LOPRESSOR) 25 MG tablet Take 12.5 mg by mouth 2 (two) times daily.    . Multiple Vitamin (MULTIVITAMIN WITH MINERALS) TABS tablet Take 1 tablet by mouth daily. For vitamin replacement 90 tablet 4  . omeprazole (PRILOSEC) 40 MG capsule Take 1 capsule (40 mg total) by mouth daily. 90 capsule 3  . sodium bicarbonate 650 MG tablet Take 2 tablets (1,300 mg total) by mouth 2 (two) times daily. 60 tablet 1  . thiamine (VITAMIN B-1) 100 MG tablet Take 1 tablet (100 mg total) by mouth daily. For low thiamine 90 tablet 3  . vitamin B-12 (CYANOCOBALAMIN) 250 MCG tablet Take 1 tablet (250 mcg total) by mouth every evening. 90 tablet 3   Current Facility-Administered Medications  Medication Dose Route Frequency Provider Last Rate Last Dose  . 0.9 %  sodium chloride infusion   Intravenous Continuous Fredia Sorrow, MD 10 mL/hr at 10/13/14 0131    . 0.9 %  sodium chloride infusion   Intravenous Continuous Jones Bales, MD      . antiseptic oral rinse (CPC / CETYLPYRIDINIUM CHLORIDE 0.05%) solution 7 mL  7 mL Mouth Rinse BID Bartholomew Crews, MD      . calcitRIOL (ROCALTROL) capsule 0.25 mcg  0.25 mcg Oral Daily Jones Bales, MD      . calcium carbonate (TUMS - dosed in mg elemental calcium) chewable tablet 1,200 mg of elemental calcium  6 tablet Oral TID WC Jones Bales, MD      . FLUoxetine (PROZAC) capsule 10 mg  10 mg Oral Daily Jones Bales, MD      . folic acid (FOLVITE) tablet 1 mg  1 mg Oral Daily Jones Bales, MD      . folic acid injection 1 mg  1 mg Intravenous  Daily Jones Bales, MD      . loperamide (IMODIUM) capsule 2 mg  2 mg Oral PRN Jones Bales, MD      . magnesium chloride (SLOW-MAG) 64 MG SR tablet 128 mg  2 tablet Oral Daily Jones Bales, MD   128 mg at 10/13/14 0245  . ondansetron (ZOFRAN) injection 4 mg  4 mg Intravenous Once Fredia Sorrow, MD   4 mg at 10/12/14 2015  . oxyCODONE (Oxy IR/ROXICODONE) immediate release tablet 5 mg  5 mg Oral Q4H PRN Luan Moore, MD   5 mg at 10/13/14 0333  . sodium bicarbonate tablet 1,300 mg  1,300 mg Oral BID Jones Bales, MD      .  thiamine (B-1) injection 100 mg  100 mg Intravenous Daily Jones Bales, MD      . vitamin B-12 (CYANOCOBALAMIN) tablet 250 mcg  250 mcg Oral QPM Jones Bales, MD        Past Medical History  Diagnosis Date  . Anemia, B12 deficiency   . History of acute pancreatitis   . Right knee pain     No recent imaging on chart  . 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 Dr. Mancel Bale, [facility]  . Hypertriglyceridemia   . GERD (gastroesophageal reflux disease)   . Subdural hematoma 02/2008    Likely 2/2 trauma from seizure from EtOH withdrawal, chronic in nature, sees Dr. Jerene Bears. Most recent CT head 10/2009 showing stable but persistent hematoma without mass effect.  . History of seizure disorder     Likely 2/2 alcohol abuse  . Hypocalcemia   . Hypomagnesemia   . Failure to thrive in childhood     Unclear etiology  . HTN (hypertension)   . Thrombocytopenia   . Hepatomegaly     On exam  . Joint pain   . Alcohol abuse   . Vitamin D deficiency   . Pancreatitis   . Insomnia   . Hyperlipidemia   . Pernicious anemia   . Macrocytic anemia   . 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 Depression   . Fx humeral neck 04/17/2011    Transverse fracture- minimally displaced- managed as outpatient   . ABNORMAL PAP SMEAR, LGSIL 07/23/2008    Annotation: HPV positive CIN I Dr. Mancel Bale, [facility] Qualifier: Diagnosis of  By: Oretha Ellis    . Pneumonia 05/20/2012  . Arthritis     "shoulders" (08/15/2013)  . 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  Marland Kitchen Chronic diastolic CHF (congestive heart failure)   . Hypomagnesemia   . Seizures     "don't know when/why I had them; daughter was always there w/me"  . On home oxygen therapy     "3L; mostly at night" (06/19/2014)  . Shortness of breath dyspnea     Past Surgical History  Procedure Laterality Date  . Cesarean section  1983  . Esophagogastroduodenoscopy  07/11/2011    Procedure: ESOPHAGOGASTRODUODENOSCOPY (EGD);  Surgeon: Beryle Beams, MD;  Location: [facility];  Service: Endoscopy;  Laterality: N/A;  . Colonoscopy  07/11/2011    Procedure: COLONOSCOPY;  Surgeon: Beryle Beams, MD;  Location: [facility] ENDOSCOPY;  Service: Endoscopy;  Laterality: N/A;  . Eye surgery Left     "trauma"  . Right colectomy  08/28/2011  . Esophagogastroduodenoscopy N/A 12/01/2012    Procedure: ESOPHAGOGASTRODUODENOSCOPY (EGD);  Surgeon: Irene Shipper, MD;  Location: [facility];  Service: Endoscopy;  Laterality: N/A;  . Colonoscopy with esophagogastroduodenoscopy (egd) Left 08/21/2013    Procedure: COLONOSCOPY WITH ESOPHAGOGASTRODUODENOSCOPY (EGD);  Surgeon: Beryle Beams, MD;  Location: [facility] ENDOSCOPY;  Service: Endoscopy;  Laterality: Left;  . Subxyphoid pericardial window N/A 12/04/2013    Procedure: SUBXYPHOID PERICARDIAL WINDOW WITH TEE;  Surgeon: Ivin Poot, MD;  Location: [place];  Service: Thoracic;  Laterality: N/A;  . Intraoperative transesophageal echocardiogram N/A 12/04/2013    Procedure: INTRAOPERATIVE TRANSESOPHAGEAL ECHOCARDIOGRAM;  Surgeon: Ivin Poot, MD;  Location: [place];  Service: Open Heart Surgery;  Laterality: N/A;     Allergies: Allergies as of 10/12/2014 -  Review Complete 10/12/2014  Allergen Reaction Noted  . Amitriptyline hcl Swelling   . Doxycycline hyclate Itching  11/10/2010    Family History  Problem Relation Age of Onset  . Cancer Mother     Died from stomach cancer and "flesh eating rash  . Heart failure Father     Died in 17s from an MI  . Alcohol abuse Sister     Twin sister drinks a lot, as did both her parents and brothers  . Stroke Brother     Has 7 brothers, 1 with CVA  . Lupus Mother     History   Social History  . Marital Status: Divorced    Spouse Name: N/A  . Number of Children: N/A  . Years of Education: N/A   Occupational History  . Not on file.   Social History Main Topics  . Smoking status: Former Smoker -- 0.50 packs/day for 40 years    Types: Cigarettes    Quit date: 09/20/2010  . Smokeless tobacco: Never Used  . Alcohol Use: Yes     Comment: 06/19/2014 "graduated from [employer] (for alcohol abuse) in October 2015"   . Drug Use: Yes    Special: Marijuana, Cocaine     Comment: 06/19/2014 "last drug use was in 2012"  . Sexual Activity: Not Currently   Other Topics Concern  . Not on file   Social History Narrative   Lives with her significant other and 2 grandchildren. 1 child   Has 7 brothers and 4 sisters, 1 twin sister.   Unemployed, worked in [employer].    Abuses alcohol-drinks 1 glass of wine daily    No drug use. Former cigarette use quit 1.5 years ago.     11 th grade education              Review of Systems: All pertinent ROS as stated in HPI.   Physical Exam: Blood pressure 109/84, pulse 29, temperature 97.6 F (36.4 C), temperature source Oral, resp. rate 31, height 5' 1"  (1.549 m), weight 41.2 kg (90 lb 13.3 oz), SpO2 100 %. General: resting in bed, in no acute distress HEENT: PERRL, EOMI, no scleral icterus Cardiac: Tachycardic, regular rhythm, no rubs, murmurs or gallops Pulm: diminished breath sounds in the right lower lung base with mild wheezes, moving normal volumes of air, mild increased work of breathing Abd: soft, nontender, nondistended, BS present Ext: warm and  well perfused, no pedal edema Neuro: alert and oriented X3, cranial nerves II-XII grossly intact Skin: no rashes or lesions noted Psych: appropriate affect  Lab results: Basic Metabolic Panel:  Recent Labs  10/12/14 1939 10/12/14 2204  NA 136  --   K 5.3*  --   CL 111  --   CO2 <5*  --   GLUCOSE 127*  --   BUN 26*  --   CREATININE 5.10*  --   CALCIUM 6.0*  --   MG  --  1.3*   Liver Function Tests:  Recent Labs  10/12/14 1932  AST 74*  ALT 14  ALKPHOS 436*  BILITOT 0.7  PROT 8.0  ALBUMIN 1.3*    Recent Labs  10/12/14 1932  LIPASE 13*   No results for input(s): AMMONIA in the last 72 hours. CBC:  Recent Labs  10/12/14 1939 10/13/14 0220  WBC 5.2 PENDING  HGB 7.9* 7.2*  HCT 26.0* 22.8*  MCV 100.4* 93.4  PLT 82* PENDING   Cardiac Enzymes: No results for  input(s): CKTOTAL, CKMB, CKMBINDEX, TROPONINI in the last 72 hours. BNP: No results for input(s): PROBNP in the last 72 hours. D-Dimer: No results for input(s): DDIMER in the last 72 hours. CBG: No results for input(s): GLUCAP in the last 72 hours. Hemoglobin A1C: No results for input(s): HGBA1C in the last 72 hours. Fasting Lipid Panel: No results for input(s): CHOL, HDL, LDLCALC, TRIG, CHOLHDL, LDLDIRECT in the last 72 hours. Thyroid Function Tests: No results for input(s): TSH, T4TOTAL, FREET4, T3FREE, THYROIDAB in the last 72 hours. Anemia Panel: No results for input(s): VITAMINB12, FOLATE, FERRITIN, TIBC, IRON, RETICCTPCT in the last 72 hours. Coagulation: No results for input(s): LABPROT, INR in the last 72 hours. Urine Drug Screen: Drugs of Abuse     Component Value Date/Time   LABOPIA NONE DETECTED 09/17/2014 1849   LABOPIA NEG 09/18/2011 0936   COCAINSCRNUR NONE DETECTED 09/17/2014 1849   COCAINSCRNUR NEG 09/18/2011 0936   LABBENZ NONE DETECTED 09/17/2014 1849   LABBENZ NEG 09/18/2011 0936   LABBENZ NEG 04/10/2011 1130   AMPHETMU NONE DETECTED 09/17/2014 1849   AMPHETMU NEG  09/18/2011 0936   AMPHETMU NEG 04/10/2011 1130   THCU NONE DETECTED 09/17/2014 1849   THCU NEG 09/18/2011 0936   LABBARB NONE DETECTED 09/17/2014 1849   LABBARB NEG 09/18/2011 0936    Alcohol Level:  Recent Labs  10/13/14 0049  ETH 18*   Urinalysis: No results for input(s): COLORURINE, LABSPEC, PHURINE, GLUCOSEU, HGBUR, BILIRUBINUR, KETONESUR, PROTEINUR, UROBILINOGEN, NITRITE, LEUKOCYTESUR in the last 72 hours.  Invalid input(s): APPERANCEUR Imaging results:  Dg Chest Port 1 View  10/12/2014   CLINICAL DATA:  Increased shortness of breath.  Smoker.  EXAM: PORTABLE CHEST - 1 VIEW  COMPARISON:  09/19/2014.  FINDINGS: Enlarged cardiac silhouette with an increase in size. Mild increase in prominence of the pulmonary vasculature. Increased right pleural fluid. No significant change in a small left pleural effusion. No significant change in prominence of the interstitial markings. Old, healed bilateral humeral neck fractures.  IMPRESSION: 1. Moderate-sized right pleural effusion, increased. 2. Stable small left pleural effusion. 3. Progressive cardiomegaly and pulmonary vascular congestion with no gross change in chronic interstitial lung disease with possible interstitial pulmonary edema.   Electronically Signed   By: Claudie Revering M.D.   On: 10/12/2014 20:21    Other results: EKG Interpretation  Date/Time:    Ventricular Rate:    PR Interval:    QRS Duration:   QT Interval:    QTC Calculation:   R Axis:     Text Interpretation:     Assessment & Plan by Problem: Active Problems:   Metabolic acidosis  Patient is a 59 year old with a history of stage IV chronic kidney disease, SLE, chronic combined heart failure, anemia of chronic disease, pericarditis with moderate temp not status post pericardial window in July 2015, and alcohol abuse who is admitted for dyspnea and a lactic acidosis.  Dyspnea: Patient noted to have increased dyspnea of her usual baseline which requires 4 L of  oxygen since her recent admission for HCAP. Patient is on baseline oxygen supplementation because of suspected COPD although no pulmonary function tests have been done to date upon my review of the chart. Chest x-ray was remarkable for a moderate sized right pleural effusion and stable left pleural effusion. There is possibly an associated infiltrate with the pleural effusion with a equivocal Pro calcitonin 0.31. Patient is afebrile with no leukocytosis. BNP is elevated above 4500 although there does not seem to be significant  evidence of pulmonary edema on physical exam or chest x-ray. Patient's dyspnea could be secondary to a compensatory respiratory alkalosis due to her profound metabolic acidosis. Arterial blood gas showing a primary metabolic acidosis which is appropriate decompensated respiratorily. Initial blood gas from the ED with 6.84/14.6/240/2.5. Repeat ABG several hours later showing improvement with 7.06/12.8/149/3.4. Additionally, patient's tenuous baseline respiratory status may be exacerbated by her right pleural effusion, perhaps leading to lactic acidosis as patient was hypoxic upon initial evaluation. The etiology of this effusion is less likely infectious as discussed above. There is a possibility for its relation to patient's systemic lupus erythematosus, potentially in the setting of an acute flare with lupus pleuritis. Patient's diffuse pain throughout her chest, abdomen, legs, back may corroborate this.  -Vancomycin and cefepime -Consider thoracentesis. Patient has a transudative thoracentesis from 2014. -ESR pending -double stranded DNA, C3, C4 pending -Supplemental oxygen  Anion gap metabolic lactic acidosis: Bicarbonate of less than 5. Anion gap of 20. Patient's lactic acid found to be elevated at 11.54. Patient's acetaminophen and salicylate levels are both negative. Would question whether initial elevated level may have been secondary to respiratory compromise. Otherwise, patient  is afebrile with no leukocytosis. Blood pressures are stable with no identified sources of potential infection. Abdominal exam unremarkable. -Repeat lactic acid level pending. -Urinalysis and urine culture pending -Sodium bicarbonate 150 mEq and sterile water at 50 mL per hour -Beta hydroxybutyric acid pending  Diffuse pain in chest, abdomen, legs, back: Patient has a previous diagnosis of chronic pain on opiates although patient also does have a history of systemic lupus erythematosus. There are no obvious rashes upon exam today. However, diffuse pain may be indicative of an acute flare of patient's SLE. Patient not currently on any immunosuppressive therapies. Otherwise, patient's lipase is normal at 13. Troponin within normal limits at 0.08. No concerning changes on EKG. Patient's AST is elevated at 74 in the setting of chronic alcohol abuse. Patient's alkaline phosphatase is elevated at 436 although this has been chronically elevated. Patient has been worked up in the past with a confirmed elevated GGT and abdominal ultrasound which was only remarkable for gallbladder sludge with no evidence of cholelithiasis. Patient was scheduled for follow-up with GI for a possible MRCP/ERCP and liver biopsy. This appointment seems to have not occurred, initially scheduled for March 2016. -Workup for lupus flare as above. -Urine drug screen pending -Urinalysis and culture pending -Blood culture pending -Ethanol level marginally elevated. -Oxycodone 5 mg every 4 hours when necessary  Acute on chronic kidney disease: Creatinine elevated at 5.1 above a baseline of 2-3. Decrease renal function could be contributory to right-sided pleural effusion. Lupus nephritis is also a possibility in the setting of diffuse pain. Additionally, patient's electrolyte disturbances could be consistent with a tubulointerstitial disease that goes with lupus nephritis. Specifically, patient's presentation seems to be consistent with a  type I RTA given profound depression and bicarbonate, history of hypokalemia, and prior urine pH from April 2016 of 5.5. -Workup for lupus flare as above. -Urinalysis and urine culture pending  -Consider nephrology consult -Repeat basic metabolic panel -Hold home furosemide 40 mg daily.  Neuropathic pain: -Hold home gabapentin. This was discontinued during last hospitalization per PCP request.  Hypertension: Patient with low-normal blood pressures upon initial evaluation. -Hold home amlodipine, metoprolol, and Lasix   Chronic diarrhea: C. difficile negative on last hospitalization. Likely multifactorial. Could be secondary to magnesium oxide therapy. Patient had also previously been on Creon; unclear whether she is still on  this. -Imodium when necessary  Hyperkalemia: Potassium of 5.3. -Continue to monitor  Hypocalcemia: Corrected calcium of 8.2. At home, patient is on calcium carbonate and calcitriol at home. Patient received 1 g of calcium gluconate in the emergency department. -Continue home regimen.  Hypomagnesemia: Magnesium of 1.3. At home, patient is on magnesium oxide supplementation. -Continue with magnesium chloride supplementation as an inpatient. Would caution against long-term therapy with magnesium chloride in the setting of renal insufficiency.  Thrombocytopenia: Thrombocytopenia overall stable at 82 likely multifactorial due to SLE and chronic alcoholism. -Hold on pharmacologic prophylaxis.  Alcohol abuse: Positive alcohol level. Patient states that she drinks 2-3 glasses of wine a day. -Administer thiamine, folic acid, P-50 -CIWA protocol  Depression: -Continue home fluoxetine 10 mg daily.  Diet: Renal Prophylaxis: SCDs Code: Full  Dispo: Disposition is deferred at this time, awaiting improvement of current medical problems. Anticipated discharge in approximately 3 day(s).   The patient does have a current PCP Otho Bellows, MD) and does need an [facility]  hospital follow-up appointment after discharge.  The patient does not have transportation limitations that hinder transportation to clinic appointments.  Signed: Luan Moore, M.D., Ph.D. Internal Medicine Teaching Service, PGY-1 10/13/2014, 4:05 AM

## 2014-10-12 NOTE — Assessment & Plan Note (Signed)
Medication noncompliance. Bicarb down to 7 10/08/2014.  -Sodium bicarb 1300mg  BID -Recheck in 1 week -Follow up with renal

## 2014-10-12 NOTE — Assessment & Plan Note (Signed)
Occasional SOB and continues to use supplemental oxygen at night and during the day as needed.  -Will need repeat CXR at f/u in 2 weeks to make sure 4/16 infiltrates resolved

## 2014-10-12 NOTE — Progress Notes (Addendum)
RT obtained ABG and called ED MD with critical results.

## 2014-10-12 NOTE — Telephone Encounter (Signed)
Call placed on [facility] to confirm receipt of add on of [facility] RN services.  CSW spoke with Patient Care Manager, Barnett Applebaum.  Order was received, however it was assigned to a [facility] that had been out of the office.  Care Manager states pt will be assigned today and and seen this week.

## 2014-10-12 NOTE — Assessment & Plan Note (Deleted)
BP Readings from Last 3 Encounters:  10/09/14 111/72  09/30/14 138/92  09/23/14 129/82    Lab Results  Component Value Date   NA 133* 10/08/2014   K 4.2 10/08/2014   CREATININE 4.40* 10/08/2014    Assessment: Blood pressure control:   Progress toward BP goal:     Plan: Medications:  continue current medications

## 2014-10-13 ENCOUNTER — Inpatient Hospital Stay (HOSPITAL_COMMUNITY): Payer: Medicare Other

## 2014-10-13 DIAGNOSIS — N179 Acute kidney failure, unspecified: Principal | ICD-10-CM

## 2014-10-13 DIAGNOSIS — N184 Chronic kidney disease, stage 4 (severe): Secondary | ICD-10-CM

## 2014-10-13 DIAGNOSIS — J90 Pleural effusion, not elsewhere classified: Secondary | ICD-10-CM

## 2014-10-13 DIAGNOSIS — Z87891 Personal history of nicotine dependence: Secondary | ICD-10-CM

## 2014-10-13 DIAGNOSIS — G894 Chronic pain syndrome: Secondary | ICD-10-CM

## 2014-10-13 DIAGNOSIS — J80 Acute respiratory distress syndrome: Secondary | ICD-10-CM

## 2014-10-13 DIAGNOSIS — K529 Noninfective gastroenteritis and colitis, unspecified: Secondary | ICD-10-CM

## 2014-10-13 DIAGNOSIS — E875 Hyperkalemia: Secondary | ICD-10-CM

## 2014-10-13 DIAGNOSIS — M329 Systemic lupus erythematosus, unspecified: Secondary | ICD-10-CM

## 2014-10-13 DIAGNOSIS — F1020 Alcohol dependence, uncomplicated: Secondary | ICD-10-CM

## 2014-10-13 DIAGNOSIS — E872 Acidosis: Secondary | ICD-10-CM

## 2014-10-13 DIAGNOSIS — I959 Hypotension, unspecified: Secondary | ICD-10-CM

## 2014-10-13 DIAGNOSIS — G629 Polyneuropathy, unspecified: Secondary | ICD-10-CM

## 2014-10-13 DIAGNOSIS — D696 Thrombocytopenia, unspecified: Secondary | ICD-10-CM

## 2014-10-13 LAB — PROTEIN, BODY FLUID

## 2014-10-13 LAB — TROPONIN I
TROPONIN I: 0.09 ng/mL — AB (ref <0.031)
TROPONIN I: 0.14 ng/mL — AB (ref <0.031)
Troponin I: 0.1 ng/mL — ABNORMAL HIGH (ref <0.031)
Troponin I: 0.12 ng/mL — ABNORMAL HIGH (ref <0.031)

## 2014-10-13 LAB — BASIC METABOLIC PANEL
ANION GAP: 20 — AB (ref 5–15)
ANION GAP: 17 — AB (ref 5–15)
BUN: 24 mg/dL — ABNORMAL HIGH (ref 6–20)
BUN: 27 mg/dL — AB (ref 6–20)
CALCIUM: 6.3 mg/dL — AB (ref 8.9–10.3)
CHLORIDE: 111 mmol/L (ref 101–111)
CO2: 6 mmol/L — ABNORMAL LOW (ref 22–32)
CO2: 10 mmol/L — ABNORMAL LOW (ref 22–32)
CREATININE: 5.15 mg/dL — AB (ref 0.44–1.00)
CREATININE: 5.23 mg/dL — AB (ref 0.44–1.00)
Calcium: 6.2 mg/dL — CL (ref 8.9–10.3)
Chloride: 111 mmol/L (ref 101–111)
GFR calc Af Amer: 10 mL/min — ABNORMAL LOW (ref >60)
GFR calc Af Amer: 10 mL/min — ABNORMAL LOW (ref >60)
GFR calc non Af Amer: 8 mL/min — ABNORMAL LOW (ref >60)
GFR, EST NON AFRICAN AMERICAN: 8 mL/min — AB (ref >60)
GLUCOSE: 145 mg/dL — AB (ref 70–99)
Glucose, Bld: 94 mg/dL (ref 70–99)
Potassium: 6.1 mmol/L (ref 3.5–5.1)
Potassium: 4.4 mmol/L (ref 3.5–5.1)
Sodium: 137 mmol/L (ref 135–145)
Sodium: 138 mmol/L (ref 135–145)

## 2014-10-13 LAB — BETA-HYDROXYBUTYRIC ACID: Beta-Hydroxybutyric Acid: 0.68 mmol/L — ABNORMAL HIGH (ref 0.05–0.27)

## 2014-10-13 LAB — URINE MICROSCOPIC-ADD ON

## 2014-10-13 LAB — CBC
HCT: 24.5 % — ABNORMAL LOW (ref 36.0–46.0)
HEMATOCRIT: 22.8 % — AB (ref 36.0–46.0)
HEMOGLOBIN: 7.2 g/dL — AB (ref 12.0–15.0)
Hemoglobin: 7.9 g/dL — ABNORMAL LOW (ref 12.0–15.0)
MCH: 29.5 pg (ref 26.0–34.0)
MCH: 29.9 pg (ref 26.0–34.0)
MCHC: 31.6 g/dL (ref 30.0–36.0)
MCHC: 32.2 g/dL (ref 30.0–36.0)
MCV: 93.4 fL (ref 78.0–100.0)
MCV: 92.8 fL (ref 78.0–100.0)
Platelets: 74 10*3/uL — ABNORMAL LOW (ref 150–400)
Platelets: 54 10*3/uL — ABNORMAL LOW (ref 150–400)
RBC: 2.44 MIL/uL — ABNORMAL LOW (ref 3.87–5.11)
RBC: 2.64 MIL/uL — ABNORMAL LOW (ref 3.87–5.11)
RDW: 19.6 % — ABNORMAL HIGH (ref 11.5–15.5)
RDW: 19.9 % — ABNORMAL HIGH (ref 11.5–15.5)
WBC: 8.4 10*3/uL (ref 4.0–10.5)
WBC: 8.7 10*3/uL (ref 4.0–10.5)

## 2014-10-13 LAB — VALPROIC ACID LEVEL: Valproic Acid Lvl: <10 ug/mL — ABNORMAL LOW (ref 50.0–100.0)

## 2014-10-13 LAB — URINALYSIS, ROUTINE W REFLEX MICROSCOPIC
BILIRUBIN URINE: NEGATIVE
Glucose, UA: NEGATIVE mg/dL
KETONES UR: NEGATIVE mg/dL
Leukocytes, UA: NEGATIVE
Nitrite: NEGATIVE
Protein, ur: >300 mg/dL — AB
SPECIFIC GRAVITY, URINE: 1.017 (ref 1.005–1.030)
UROBILINOGEN UA: 0.2 mg/dL (ref 0.0–1.0)
pH: 5 (ref 5.0–8.0)

## 2014-10-13 LAB — COMPREHENSIVE METABOLIC PANEL
ALT: 19 U/L (ref 14–54)
AST: 94 U/L — ABNORMAL HIGH (ref 15–41)
Albumin: 1.2 g/dL — ABNORMAL LOW (ref 3.5–5.0)
Alkaline Phosphatase: 379 U/L — ABNORMAL HIGH (ref 38–126)
BUN: 23 mg/dL — ABNORMAL HIGH (ref 6–20)
CALCIUM: 6.2 mg/dL — AB (ref 8.9–10.3)
CHLORIDE: 110 mmol/L (ref 101–111)
CO2: <5 mmol/L — ABNORMAL LOW (ref 22–32)
Creatinine, Ser: 5.26 mg/dL — ABNORMAL HIGH (ref 0.44–1.00)
GFR calc non Af Amer: 8 mL/min — ABNORMAL LOW (ref >60)
GFR, EST AFRICAN AMERICAN: 10 mL/min — AB (ref >60)
GLUCOSE: 148 mg/dL — AB (ref 70–99)
POTASSIUM: 5.3 mmol/L — AB (ref 3.5–5.1)
SODIUM: 134 mmol/L — AB (ref 135–145)
TOTAL PROTEIN: 7.3 g/dL (ref 6.5–8.1)
Total Bilirubin: 0.6 mg/dL (ref 0.3–1.2)

## 2014-10-13 LAB — BLOOD GAS, ARTERIAL
Acid-base deficit: 25.3 mmol/L — ABNORMAL HIGH (ref 0.0–2.0)
BICARBONATE: 3.4 meq/L — AB (ref 20.0–24.0)
Drawn by: 39898
O2 Content: 4 L/min
O2 Saturation: 98.2 %
PATIENT TEMPERATURE: 98.6
PO2 ART: 149 mmHg — AB (ref 80.0–100.0)
TCO2: 3.8 mmol/L (ref 0–100)
pCO2 arterial: 12.8 mmHg — CL (ref 35.0–45.0)
pH, Arterial: 7.057 — CL (ref 7.350–7.450)

## 2014-10-13 LAB — RAPID URINE DRUG SCREEN, HOSP PERFORMED
AMPHETAMINES: NOT DETECTED
BARBITURATES: NOT DETECTED
BENZODIAZEPINES: NOT DETECTED
COCAINE: NOT DETECTED
Opiates: POSITIVE — AB
Tetrahydrocannabinol: NOT DETECTED

## 2014-10-13 LAB — BODY FLUID CELL COUNT WITH DIFFERENTIAL
EOS FL: 0 %
Lymphs, Fluid: 21 %
Monocyte-Macrophage-Serous Fluid: 78 % (ref 50–90)
Neutrophil Count, Fluid: 1 % (ref 0–25)
OTHER CELLS FL: 0 %
Total Nucleated Cell Count, Fluid: 167 cu mm (ref 0–1000)

## 2014-10-13 LAB — MAGNESIUM
MAGNESIUM: 1 mg/dL — AB (ref 1.7–2.4)
Magnesium: 1.1 mg/dL — ABNORMAL LOW (ref 1.7–2.4)

## 2014-10-13 LAB — PHOSPHORUS: Phosphorus: 9 mg/dL — ABNORMAL HIGH (ref 2.5–4.6)

## 2014-10-13 LAB — LACTIC ACID, PLASMA
LACTIC ACID, VENOUS: 8 mmol/L — AB (ref 0.5–2.0)
Lactic Acid, Venous: 10.3 mmol/L (ref 0.5–2.0)
Lactic Acid, Venous: 9.3 mmol/L (ref 0.5–2.0)

## 2014-10-13 LAB — URIC ACID: URIC ACID, SERUM: 11.4 mg/dL — AB (ref 2.3–6.6)

## 2014-10-13 LAB — LACTATE DEHYDROGENASE, PLEURAL OR PERITONEAL FLUID: LD, Fluid: 86 U/L — ABNORMAL HIGH (ref 3–23)

## 2014-10-13 LAB — POCT I-STAT 3, ART BLOOD GAS (G3+)
ACID-BASE DEFICIT: 30 mmol/L — AB (ref 0.0–2.0)
Bicarbonate: 2.5 mEq/L — ABNORMAL LOW (ref 20.0–24.0)
O2 SAT: 99 %
PO2 ART: 237 mmHg — AB (ref 80.0–100.0)
Patient temperature: 97.4
TCO2: <5 mmol/L (ref 0–100)
pCO2 arterial: 14.2 mmHg — CL (ref 35.0–45.0)
pH, Arterial: 6.848 — CL (ref 7.350–7.450)

## 2014-10-13 LAB — ACETAMINOPHEN LEVEL: Acetaminophen (Tylenol), Serum: <10 ug/mL — ABNORMAL LOW (ref 10–30)

## 2014-10-13 LAB — PROCALCITONIN: PROCALCITONIN: 0.31 ng/mL

## 2014-10-13 LAB — GLUCOSE, SEROUS FLUID: Glucose, Fluid: 127 mg/dL

## 2014-10-13 LAB — SEDIMENTATION RATE

## 2014-10-13 LAB — SALICYLATE LEVEL: Salicylate Lvl: <4 mg/dL (ref 2.8–30.0)

## 2014-10-13 LAB — ETHANOL: ALCOHOL ETHYL (B): 18 mg/dL — AB (ref <5)

## 2014-10-13 LAB — PROTIME-INR
INR: 2.13 — ABNORMAL HIGH (ref 0.00–1.49)
Prothrombin Time: 24 seconds — ABNORMAL HIGH (ref 11.6–15.2)

## 2014-10-13 LAB — MRSA PCR SCREENING: MRSA by PCR: NEGATIVE

## 2014-10-13 MED ORDER — FLUOXETINE HCL 10 MG PO CAPS
10.0000 mg | ORAL_CAPSULE | Freq: Every day | ORAL | Status: DC
  Administered 2014-10-13 – 2014-10-29 (×17): 10 mg via ORAL
  Filled 2014-10-13 (×17): qty 1

## 2014-10-13 MED ORDER — VITAMIN B-1 100 MG PO TABS: 100.0000 mg | ORAL_TABLET | Freq: Every day | ORAL | Status: DC

## 2014-10-13 MED ORDER — INSULIN ASPART 100 UNIT/ML IV SOLN
5.0000 [IU] | Freq: Once | INTRAVENOUS | Status: AC
  Administered 2014-10-13: 5 [IU] via INTRAVENOUS

## 2014-10-13 MED ORDER — CYANOCOBALAMIN 250 MCG PO TABS
250.0000 ug | ORAL_TABLET | Freq: Every evening | ORAL | Status: DC
  Administered 2014-10-13 – 2014-10-28 (×16): 250 ug via ORAL
  Filled 2014-10-13 (×17): qty 1

## 2014-10-13 MED ORDER — STERILE WATER FOR INJECTION IV SOLN
INTRAVENOUS | Status: DC
Start: 2014-10-13 — End: 2014-10-14
  Administered 2014-10-13 – 2014-10-14 (×2): via INTRAVENOUS
  Filled 2014-10-13 (×5): qty 850

## 2014-10-13 MED ORDER — FOLIC ACID 1 MG PO TABS
1.0000 mg | ORAL_TABLET | Freq: Every day | ORAL | Status: DC
  Administered 2014-10-13 – 2014-10-18 (×6): 1 mg via ORAL
  Filled 2014-10-13 (×7): qty 1

## 2014-10-13 MED ORDER — CALCIUM CARBONATE ANTACID 500 MG PO CHEW
6.0000 | CHEWABLE_TABLET | Freq: Three times a day (TID) | ORAL | Status: DC
  Administered 2014-10-13 – 2014-10-18 (×14): 1200 mg via ORAL
  Filled 2014-10-13 (×20): qty 6

## 2014-10-13 MED ORDER — SODIUM CHLORIDE 0.9 % IV SOLN
1.0000 g | Freq: Once | INTRAVENOUS | Status: AC
  Administered 2014-10-13: 1 g via INTRAVENOUS
  Filled 2014-10-13: qty 10

## 2014-10-13 MED ORDER — SODIUM CHLORIDE 0.9 % IV SOLN
INTRAVENOUS | Status: DC
  Administered 2014-10-13: 08:00:00 via INTRAVENOUS

## 2014-10-13 MED ORDER — MAGNESIUM CHLORIDE 64 MG PO TBEC
2.0000 | DELAYED_RELEASE_TABLET | Freq: Every day | ORAL | Status: DC
  Administered 2014-10-13 – 2014-10-18 (×6): 128 mg via ORAL
  Filled 2014-10-13 (×7): qty 2

## 2014-10-13 MED ORDER — STERILE WATER FOR INJECTION IV SOLN
INTRAVENOUS | Status: DC
Start: 2014-10-13 — End: 2014-10-13
  Administered 2014-10-13: 05:00:00 via INTRAVENOUS
  Filled 2014-10-13: qty 850

## 2014-10-13 MED ORDER — DEXTROSE 5 % IV SOLN
500.0000 mg | INTRAVENOUS | Status: DC
  Administered 2014-10-13 – 2014-10-14 (×2): 500 mg via INTRAVENOUS
  Filled 2014-10-13 (×2): qty 0.5

## 2014-10-13 MED ORDER — LOPERAMIDE HCL 2 MG PO CAPS
2.0000 mg | ORAL_CAPSULE | ORAL | Status: DC | PRN
  Filled 2014-10-13: qty 1

## 2014-10-13 MED ORDER — OXYCODONE HCL 5 MG PO TABS
5.0000 mg | ORAL_TABLET | ORAL | Status: DC | PRN
Start: 2014-10-13 — End: 2014-10-29
  Administered 2014-10-13 – 2014-10-28 (×18): 5 mg via ORAL
  Filled 2014-10-13 (×19): qty 1

## 2014-10-13 MED ORDER — LIDOCAINE HCL (PF) 1 % IJ SOLN
INTRAMUSCULAR | Status: AC
  Filled 2014-10-13: qty 10

## 2014-10-13 MED ORDER — MAGNESIUM SULFATE 2 GM/50ML IV SOLN
2.0000 g | Freq: Once | INTRAVENOUS | Status: AC
  Administered 2014-10-13: 2 g via INTRAVENOUS
  Filled 2014-10-13: qty 50

## 2014-10-13 MED ORDER — SODIUM POLYSTYRENE SULFONATE 15 GM/60ML PO SUSP
45.0000 g | Freq: Once | ORAL | Status: AC
  Administered 2014-10-13: 45 g via ORAL
  Filled 2014-10-13: qty 180

## 2014-10-13 MED ORDER — THIAMINE HCL 100 MG/ML IJ SOLN
100.0000 mg | Freq: Once | INTRAMUSCULAR | Status: AC
  Administered 2014-10-13: 100 mg via INTRAVENOUS
  Filled 2014-10-13: qty 1

## 2014-10-13 MED ORDER — MAGNESIUM SULFATE IN D5W 10-5 MG/ML-% IV SOLN
1.0000 g | Freq: Once | INTRAVENOUS | Status: AC
Start: 2014-10-13 — End: 2014-10-13
  Administered 2014-10-13: 1 g via INTRAVENOUS
  Filled 2014-10-13: qty 100

## 2014-10-13 MED ORDER — DEXTROSE 50 % IV SOLN
1.0000 | Freq: Once | INTRAVENOUS | Status: AC
  Administered 2014-10-13: 50 mL via INTRAVENOUS
  Filled 2014-10-13: qty 50

## 2014-10-13 MED ORDER — VANCOMYCIN HCL 500 MG IV SOLR
500.0000 mg | INTRAVENOUS | Status: DC
  Administered 2014-10-13: 500 mg via INTRAVENOUS
  Filled 2014-10-13: qty 500

## 2014-10-13 MED ORDER — DARBEPOETIN ALFA 100 MCG/0.5ML IJ SOSY
100.0000 ug | PREFILLED_SYRINGE | INTRAMUSCULAR | Status: DC
  Administered 2014-10-13: 100 ug via SUBCUTANEOUS
  Filled 2014-10-13: qty 0.5

## 2014-10-13 MED ORDER — SODIUM BICARBONATE 650 MG PO TABS
1300.0000 mg | ORAL_TABLET | Freq: Two times a day (BID) | ORAL | Status: DC
  Administered 2014-10-13 (×2): 1300 mg via ORAL
  Filled 2014-10-13 (×4): qty 2

## 2014-10-13 MED ORDER — LORAZEPAM 2 MG/ML IJ SOLN
2.0000 mg | INTRAMUSCULAR | Status: DC | PRN
  Administered 2014-10-13: 2 mg via INTRAVENOUS
  Filled 2014-10-13: qty 1

## 2014-10-13 MED ORDER — SODIUM CHLORIDE 0.9 % IV BOLUS (SEPSIS)
1000.0000 mL | Freq: Once | INTRAVENOUS | Status: AC
  Administered 2014-10-13: 1000 mL via INTRAVENOUS

## 2014-10-13 MED ORDER — SODIUM CHLORIDE 0.9 % IV BOLUS (SEPSIS)
250.0000 mL | Freq: Once | INTRAVENOUS | Status: AC
  Administered 2014-10-13: 250 mL via INTRAVENOUS

## 2014-10-13 MED ORDER — CETYLPYRIDINIUM CHLORIDE 0.05 % MT LIQD
7.0000 mL | Freq: Two times a day (BID) | OROMUCOSAL | Status: DC
  Administered 2014-10-13 – 2014-10-29 (×23): 7 mL via OROMUCOSAL

## 2014-10-13 MED ORDER — CALCITRIOL 0.25 MCG PO CAPS
0.2500 ug | ORAL_CAPSULE | Freq: Every day | ORAL | Status: DC
  Administered 2014-10-13 – 2014-10-29 (×17): 0.25 ug via ORAL
  Filled 2014-10-13 (×17): qty 1

## 2014-10-13 NOTE — Procedures (Signed)
Successful US guided Right thoracentesis. Yielded 670 mls of  Clear yellow fluid. Pt tolerated procedure well. No immediate complications.  Specimen was sent for labs. CXR ordered.  Benjie Karvonen, Gaetana Kawahara R PA-C 10/13/2014 1:20 PM

## 2014-10-13 NOTE — Progress Notes (Addendum)
ANTIBIOTIC CONSULT NOTE - INITIAL  Pharmacy Consult for vancomycin and cefepime Indication: rule out pneumonia  Allergies  Allergen Reactions  . Amitriptyline Hcl Swelling    In the face.  . Doxycycline Hyclate Itching    Feels like something crawling under her skin    Patient Measurements: Height: 5\' 1"  (154.9 cm) Weight: 90 lb 13.3 oz (41.2 kg) IBW/kg (Calculated) : 47.8  Vital Signs: Temp: 97.6 F (36.4 C) (05/10 0343) Temp Source: Oral (05/10 0000) BP: 114/88 mmHg (05/10 0400) Pulse Rate: 110 (05/10 0400) Intake/Output from previous day: 05/09 0701 - 05/10 0700 In: 10 [I.V.:10] Out: 45 [Urine:45] Intake/Output from this shift: Total I/O In: 10 [I.V.:10] Out: 45 [Urine:45]  Labs:  Recent Labs  10/12/14 1939 10/13/14 0220  WBC 5.2 8.4  HGB 7.9* 7.2*  PLT 82* 74*  CREATININE 5.10* 5.26*   Estimated Creatinine Clearance: 7.7 mL/min (by C-G formula based on Cr of 5.26).   Microbiology: Recent Results (from the past 720 hour(s))  Urine culture     Status: None   Collection Time: 09/17/14  6:49 PM  Result Value Ref Range Status   Specimen Description URINE, CLEAN CATCH  Final   Special Requests Normal  Final   Colony Count   Final    >=100,000 COLONIES/ML Performed at [facility]    Culture   Final    ENTEROCOCCUS SPECIES Performed at [facility]    Report Status 09/21/2014 FINAL  Final   Organism ID, Bacteria ENTEROCOCCUS SPECIES  Final      Susceptibility   Enterococcus species - MIC*    AMPICILLIN >=32 RESISTANT Resistant     LEVOFLOXACIN >=8 RESISTANT Resistant     NITROFURANTOIN 64 INTERMEDIATE Intermediate     VANCOMYCIN <=0.5 SENSITIVE Sensitive     TETRACYCLINE >=16 RESISTANT Resistant     * ENTEROCOCCUS SPECIES  MRSA PCR Screening     Status: Abnormal   Collection Time: 09/17/14  9:31 PM  Result Value Ref Range Status   MRSA by PCR POSITIVE (A) NEGATIVE Final    Comment:        The GeneXpert MRSA Assay (FDA approved  for NASAL specimens only), is one component of a comprehensive MRSA colonization surveillance program. It is not intended to diagnose MRSA infection nor to guide or monitor treatment for MRSA infections. RESULT CALLED TO, READ BACK BY AND VERIFIED WITH: S.COOPER,RN AT 0030 BY L.PITT 09/18/14   Culture, blood (routine x 2)     Status: None   Collection Time: 09/18/14 12:35 AM  Result Value Ref Range Status   Specimen Description BLOOD RIGHT HAND  Final   Special Requests BOTTLES DRAWN AEROBIC AND ANAEROBIC 3CC  Final   Culture   Final    NO GROWTH 5 DAYS Performed at [facility]    Report Status 09/24/2014 FINAL  Final  Clostridium Difficile by PCR     Status: None   Collection Time: 09/19/14  6:08 PM  Result Value Ref Range Status   C difficile by pcr NEGATIVE NEGATIVE Final  MRSA PCR Screening     Status: None   Collection Time: 10/12/14 11:40 PM  Result Value Ref Range Status   MRSA by PCR NEGATIVE NEGATIVE Final    Comment:        The GeneXpert MRSA Assay (FDA approved for NASAL specimens only), is one component of a comprehensive MRSA colonization surveillance program. It is not intended to diagnose MRSA infection nor to guide or monitor  treatment for MRSA infections.     Medical History: Past Medical History  Diagnosis Date  . Anemia, B12 deficiency   . History of acute pancreatitis   . Right knee pain     No recent imaging on chart  . 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 Dr. Mancel Bale, [facility]  . Hypertriglyceridemia   . GERD (gastroesophageal reflux disease)   . Subdural hematoma 02/2008    Likely 2/2 trauma from seizure from EtOH withdrawal, chronic in nature, sees Dr. Jerene Bears. Most recent CT head 10/2009 showing stable but persistent hematoma without mass effect.  . History of seizure disorder     Likely 2/2 alcohol abuse  . Hypocalcemia   . Hypomagnesemia   . Failure to  thrive in childhood     Unclear etiology  . HTN (hypertension)   . Thrombocytopenia   . Hepatomegaly     On exam  . Joint pain   . Alcohol abuse   . Vitamin D deficiency   . Pancreatitis   . Insomnia   . Hyperlipidemia   . Pernicious anemia   . Macrocytic anemia   . 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 Depression   . Fx humeral neck 04/17/2011    Transverse fracture- minimally displaced- managed as outpatient   . ABNORMAL PAP SMEAR, LGSIL 07/23/2008    Annotation: HPV positive CIN I Dr. Mancel Bale, [facility] Qualifier: Diagnosis of  By: Oretha Ellis    . Pneumonia 05/20/2012  . Arthritis     "shoulders" (08/15/2013)  . 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  Marland Kitchen Chronic diastolic CHF (congestive heart failure)   . Hypomagnesemia   . Seizures     "don't know when/why I had them; daughter was always there w/me"  . On home oxygen therapy     "3L; mostly at night" (06/19/2014)  . Shortness of breath dyspnea     Medications:  Prescriptions prior to admission  Medication Sig Dispense Refill Last Dose  . albuterol (PROAIR HFA) 108 (90 BASE) MCG/ACT inhaler Inhale 1-2 puffs into the lungs every 6 (six) hours as needed for wheezing or shortness of breath. 1 Inhaler 1 Taking  . amLODipine (NORVASC) 5 MG tablet Take 1 tablet (5 mg total) by mouth daily. 90 tablet 3 Taking  . calcitRIOL (ROCALTROL) 0.25 MCG capsule Take 0.25 mcg by mouth daily.   Taking  . calcium carbonate (TUMS) 500 MG chewable tablet Chew 6 tablets (1,200 mg of elemental calcium total) by mouth 3 (three) times daily. For bone health 90 tablet 3 Taking  . cetirizine (ZYRTEC) 10 MG tablet Take 1 tablet (10 mg total) by mouth daily as needed for allergies. 30 tablet 6 Taking  . feeding supplement, ENSURE COMPLETE, (ENSURE COMPLETE) LIQD Take 237 mLs by mouth 3 (three) times daily between meals. 90 Bottle 1 Taking  . FLUoxetine (PROZAC) 10 MG capsule Take 1 capsule  (10 mg total) by mouth daily. For depression 90 capsule 3 Taking  . folic acid (FOLVITE) 1 MG tablet Take 1 tablet (1 mg total) by mouth daily. For folic acid replacement 30 tablet 6 Taking  . furosemide (LASIX) 40 MG tablet Take 0.5 tablets (20 mg total) by mouth daily. 45 tablet 4 Taking  . gabapentin (NEURONTIN) 300 MG capsule Take 2 capsules (600 mg total) by mouth 3 (three) times  daily. For anxiety/pain control 540 capsule 1 Taking  . guaiFENesin-dextromethorphan (ROBITUSSIN DM) 100-10 MG/5ML syrup Take 5 mLs by mouth every 4 (four) hours as needed for cough. 118 mL 0 Past Month at Unknown time  . lactobacillus acidophilus (BACID) TABS tablet Take 2 tablets by mouth 3 (three) times daily. 90 tablet 0 Taking  . loperamide (IMODIUM) 2 MG capsule Take 1 capsule (2 mg total) by mouth as needed for diarrhea or loose stools. 30 capsule 0 Taking  . magnesium oxide (MAG-OX) 400 MG tablet Take 1 tablet (400 mg total) by mouth 4 (four) times daily. 120 tablet 1   . metoprolol tartrate (LOPRESSOR) 25 MG tablet Take 12.5 mg by mouth 2 (two) times daily.   Taking  . Multiple Vitamin (MULTIVITAMIN WITH MINERALS) TABS tablet Take 1 tablet by mouth daily. For vitamin replacement 90 tablet 4 Taking  . omeprazole (PRILOSEC) 40 MG capsule Take 1 capsule (40 mg total) by mouth daily. 90 capsule 3 Taking  . sodium bicarbonate 650 MG tablet Take 2 tablets (1,300 mg total) by mouth 2 (two) times daily. 60 tablet 1   . thiamine (VITAMIN B-1) 100 MG tablet Take 1 tablet (100 mg total) by mouth daily. For low thiamine 90 tablet 3 Taking  . vitamin B-12 (CYANOCOBALAMIN) 250 MCG tablet Take 1 tablet (250 mcg total) by mouth every evening. 90 tablet 3 Taking   Scheduled:  . antiseptic oral rinse  7 mL Mouth Rinse BID  . calcitRIOL  0.25 mcg Oral Daily  . calcium carbonate  6 tablet Oral TID WC  . FLUoxetine  10 mg Oral Daily  . folic acid  1 mg Oral Daily  . magnesium chloride  2 tablet Oral Daily  . ondansetron  4 mg  Intravenous Once  . sodium bicarbonate  1,300 mg Oral BID  . thiamine  100 mg Intravenous Daily  . thiamine  100 mg Intravenous Once  . vitamin B-12  250 mcg Oral QPM   Infusions:  . sodium chloride 10 mL/hr at 10/13/14 0131  .  sodium bicarbonate 150 mEq in sterile water 1000 mL infusion      Assessment: 59yo female had recent admission for HCAP, now w/ increased dyspnea, CXR negative for recurrent PNA but concern for PNA remains 2/2 lactic acidosis, to begin IV ABX; noted pt in acute on chronic renal failure .  Goal of Therapy:  Vancomycin trough level 15-20 mcg/ml  Plan:  Will begin vancomycin 500mg  IV Q48H and cefepime 500mg  IV Q24H and monitor CBC, SCr, Cx, levels prn.  Wynona Neat, PharmD, BCPS  10/13/2014,4:54 AM

## 2014-10-13 NOTE — Progress Notes (Signed)
With BP of 86/52 , rechecked BP of 89/65 MD made aware, pt is now in [place].

## 2014-10-13 NOTE — Progress Notes (Signed)
CRITICAL VALUE ALERT  Critical value received:  Lactic acid 8.0  Date of notification: 10/13/2014  Time of notification:  2108  Critical value read back: yes  Nurse who received alert:  Remo Lipps, RN  MD notified (1st page):  Ngo  Time of first page:  2114  MD notified (2nd page):  Time of second page:  Responding MD:  Known lab, lactic trending down.   Time MD responded:

## 2014-10-13 NOTE — Progress Notes (Signed)
Utilization review completed. Albena Comes, RN, BSN. 

## 2014-10-13 NOTE — Consult Note (Signed)
Renal Consultation  [name] [name] FGH:[id number] DOB: [birth year] DOA: 10/12/2014  Requesting physician: Arman Filter, MD [IMTS] Reason for consultation: Acidosis, acute on CKD Date of consultation: 10/13/2014   Subjective: HPI:  [name] [name] presented with worsening dyspnea in the setting of inability to take medications due to vomiting. She has a history of SLE (never on medicines), stage IV CKD (managed at [place], Dr. Justin Mend), chronic combined CHF, pericarditis s/p pericardial window 2015, alcohol abuse, and 4L O2 requirement for the past 2 weeks following HCAP. On arrival she was hypoxemic with acute kidney injury and mild hyperkalemia. She has since undergone thoracentesis and remains on 5L oxygen by nasal cannula.   She had multiple electrolyte derangements noted as an outpatient in the setting of diarrhea and suspected medication noncompliance. These included acidosis (bicarbonate 7), hypomagnesemia, hypocalcemia, and hyperphosphatemia. Potassium was 4.2.   Interval History: Patient seen with sisters in the room. She states the thoracentesis did not help with dyspnea very much. She is cold but denies fever/chills. She has pain "all over" including abdomen, and endorses chronic itching. No nausea, vomiting, diarrhea, no orthopnea, no PND, or jerking movements. Reports greatly decreased urine production.  Regarding her CKD- she has had a biopsy in the past that was mostly scarring- so not felt to have a reversible kidney process.  It is true that she has A on CKD this time but also very acidotic with lactate which is different for her and etiology is unknown  Objective: Vital signs in last 24 hours:  Temp:  [97 F (36.1 C)-97.8 F (36.6 C)] 97.8 F (36.6 C) (05/10 0721) Pulse Rate:  [29-113] 91 (05/10 1300) Resp:  [16-32] 19 (05/10 1300) BP: (86-126)/(52-95) 87/52 mmHg (05/10 1300) SpO2:  [42 %-100 %] 42 % (05/10 1300) FiO2 (%):  [4 %] 4 % (05/09 1934) Weight:  [90 lb 13.3 oz (41.2 kg)] 90  lb 13.3 oz (41.2 kg) (05/10 0000) Weight change:  Intake/Output: I/O last 3 completed shifts: In: 260 [I.V.:110; IV Piggyback:150] Out: 45 [Urine:45]  Intake/Output this shift:  Total I/O In: 760 [P.O.:220; I.V.:540] Out: -   EXAM: Gen: Thin, oriented 59 y.o.female in no distress HEENT: Tacky mucous membranes, anicteric sclerae CV: Sinus tachycardia, no murmur, gallop or rub, normal JVP, cap refill < 2 sec. Chest: Non-labored but increased rate on 5L O2 by [place]. Decreased bibasilar breath sounds with left sided crackles to mid/lower lung fields. Bandaid c/d/i at right midaxillary line. GI: +BS, soft, non-tender, non-distended Ext: No joint swelling/erythema/pain on palpation. 1+ radial pulses, palpable DP pulses bilaterally. 2+ upper thigh dependent edema without pretibial edema. Homan's neg.  Skin: No rashes or bruising Neuro: No asterixis.  Access: PIV R volar forearm.   CBC  Recent Labs  10/12/14 1939 10/13/14 0220 10/13/14 0956  WBC 5.2 8.4 8.7  HGB 7.9* 7.2* 7.9*  HCT 26.0* 22.8* 24.5*  PLT 82* 74* 54*   BMET  Recent Labs  10/12/14 1939 10/13/14 0220 10/13/14 0430 10/13/14 0956  NA 136 134*  --  137  K 5.3* 5.3*  --  6.1*  CL 111 110  --  111  CO2 <5* <5*  --  6*  GLUCOSE 127* 148*  --  94  BUN 26* 23*  --  24*  CREATININE 5.10* 5.26*  --  5.15*  CALCIUM 6.0* 6.2*  --  6.3*  PHOS  --   --  9.0*  --    LFT  Recent Labs  10/12/14 1932 10/13/14 0220  PROT 8.0 7.3  ALBUMIN 1.3* 1.2*  AST 74* 94*  ALT 14 19  ALKPHOS 436* 379*  BILITOT 0.7 0.6  BILIDIR 0.2  --   IBILI 0.5  --    PT/INR  Recent Labs  10/13/14 0340  LABPROT 24.0*  INR 2.13*   Lab Results  Component Value Date   IRON 173* 06/18/2014   TIBC NOT CALC 06/18/2014   FERRITIN 2973* 06/18/2014   PTH: Lab Results  Component Value Date   PTH 150* 09/17/2014   PTH Comment 09/17/2014   CALCIUM 6.3* 10/13/2014   CAION 0.70* 09/21/2014   PHOS 9.0* 10/13/2014   Cardiac  Enzymes:  Recent Labs Lab 10/13/14 0220 10/13/14 1130  TROPONINI 0.09* 0.10*   BNP: >4500  EKG: Independently reviewed.   Urinalysis COLORURINE YELLOW  APPEARANCEUR CLOUDY*  LABSPEC 1.017  PHURINE 5.0  GLUCOSEU NEGATIVE  GLUCOSEU NEG mg/dL  HGBUR LARGE*  BILIRUBINUR NEGATIVE  KETONESUR NEGATIVE  PROTEINUR >300*  UROBILINOGEN 0.2  NITRITE NEGATIVE  LEUKOCYTESUR NEGATIVE   Studies/Results: Dg Chest 1 View  10/13/2014   CLINICAL DATA:  RIGHT pleural effusion post thoracentesis  EXAM: CHEST  1 VIEW  COMPARISON:  10/12/2014  FINDINGS: Decreased RIGHT pleural effusion and basilar atelectasis post thoracentesis.  Enlargement of cardiac silhouette with pulmonary vascular congestion.  Stable mediastinal contours.  Persistent small LEFT pleural effusion and basilar atelectasis.  No pneumothorax post thoracentesis.  Bones demineralized unremarkable.  IMPRESSION: Decrease in RIGHT pleural effusion and basilar atelectasis post thoracentesis.  No pneumothorax.  Enlargement of cardiac silhouette with pulmonary vascular congestion.  Minimal LEFT pleural effusion and basilar atelectasis.   Electronically Signed   By: Lavonia Dana M.D.   On: 10/13/2014 13:48   Dg Chest Port 1 View  10/12/2014   CLINICAL DATA:  Increased shortness of breath.  Smoker.  EXAM: PORTABLE CHEST - 1 VIEW  COMPARISON:  09/19/2014.  FINDINGS: Enlarged cardiac silhouette with an increase in size. Mild increase in prominence of the pulmonary vasculature. Increased right pleural fluid. No significant change in a small left pleural effusion. No significant change in prominence of the interstitial markings. Old, healed bilateral humeral neck fractures.  IMPRESSION: 1. Moderate-sized right pleural effusion, increased. 2. Stable small left pleural effusion. 3. Progressive cardiomegaly and pulmonary vascular congestion with no gross change in chronic interstitial lung disease with possible interstitial pulmonary edema.   Electronically  Signed   By: Claudie Revering M.D.   On: 10/12/2014 20:21   US Thoracentesis Asp Pleural Space W/img Guide  10/13/2014   INDICATION: Symptomatic Right sided pleural effusion  EXAM: US THORACENTESIS ASP PLEURAL SPACE W/IMG GUIDE  COMPARISON:  None.  MEDICATIONS: None  COMPLICATIONS: None immediate  TECHNIQUE: Informed written consent was obtained from the patients daughter after a discussion of the risks, benefits and alternatives to treatment. A timeout was performed prior to the initiation of the procedure.  Initial ultrasound scanning demonstrates a right sided pleural effusion. The lower chest was prepped and draped in the usual sterile fashion. 1% lidocaine was used for local anesthesia.  Under direct ultrasound guidance, a 19 gauge, 7-cm, Yueh catheter was introduced. An ultrasound image was saved for documentation purposes. The thoracentesis was performed. The catheter was removed and a dressing was applied. The patient tolerated the procedure well without immediate post procedural complication. The patient was escorted to have an upright chest radiograph.  FINDINGS: A total of approximately 670 liters of serous fluid was removed. Requested samples were sent to the laboratory.  IMPRESSION: Successful ultrasound-guided right sided thoracentesis yielding 670 mls of pleural fluid.  Read by Gareth Eagle PA-C   Electronically Signed   By: Corrie Mckusick D.O.   On: 10/13/2014 13:29   ECG 5/9: Sinus tachycardia (ventricular rate 116) with rightward axis, no conduction delays, QTc 483msec, globally low voltage without peaked T waves. No ST segment changes apparent though there is significant baseline wander.   59 yo female with history of SLE, CKD 4, combined CHF, and alcohol abuse here with acute worsening of significant and progressive CKD (Cr 5.1, was 3.4 at recent discharge, approximate baseline 1.5 last year), in the setting of lactic/mixed acidosis and dyspnea with right > left pleural effusion.    Impressions/Recommendations: Acute oliguric renal failure on stage IV CKD: With a history of SLE and FSG with significant scarring on biopsy 10/29/13. Doubt would be steroid-responsive. Doubt presentation is primarily mediated by lupus flare.  - Insert trialysis catheter in case acidosis/refractory hyperkalemia indicates HD. Pt with poor functional baseline and so sarcopenic that creatinine > 5 and BUN of 24 suggests significant renal impairment and azotemia. Itching could be uremic symptom (chronically cholestatic pattern LFT elevation but TBili only 0.6).  She may be uremic even though labs are not that impressive, GFR was very poor at baseline.  If UOP cont to be poor and she clinically she may need HD - Increase bicarb-containing IVF to improve perfusion.  - Quantify proteinuria found on dipstick with UPC (history of near nephrotic range proteinuria) - C3, C4, dsDNA in process: doubt steroids would be helpful.  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  - Secondary HPTH/metabolic bone disease: PTH 150. Vit D 7.2 Ca: 6.3 (8.5 corrected): continue home calcitriol - Anemia of chronic disease: (hgb baseline ~8.5): Start ESA to help going forward, T sat % was high in January - Strict I/O, daily weight, avoid nephrotoxins, narrow from vancomycin when able  Lactic/mixed metabolic acidosis: Lactate still significantly elevated but production is likely not ongoing as level is falling slowly. Unsure of source unless  Ketones also contributing. Intravascularly depleted.  - Increase bicarbonate 148mEq in water to 125cc/hr and monitor closely.  - Low threshold to consult CCM with dropping pressures, persistent hypoxia and  tachycardia.  - ?adrenal insufficiency: Check AM cortisol. - Follow cultures  Acute on sub-acute respiratory failure with right-sided pleural effusion: requiring 5L O2 (4L home requirement since recent admission [4/14 - 4/20] for HCAP). Improving s/p thoracentesis. Labs sent. - Treating for HCAP with vanc/cefepime and O2 by [place]   Ryan B. Bonner Puna, MD, PGY-2 10/13/2014 4:21 PM   Patient seen and examined, agree with above note with above modifications. Very ill BF with advanced CKD at baseline.  She has had failure to thrive since getting out of hospital this last time. Now with many metabolic derangements including severe lactic acidosis.  For now recommend supplement with bicarb but as noted above- if still poor UOP and clinically not well this may be our indication to start HD.  Also to start ESA    Corliss Parish, MD 10/13/2014

## 2014-10-13 NOTE — Progress Notes (Signed)
CRITICAL VALUE ALERT  Critical value received:  Potassium 6.1  ; Calcium 6.3  ;Lactic Acid 9.3  Date of notification:10/13/14 Time of notification:  5449  Critical value read back:yes Nurse who received alert: Venetia Constable  MD notified (1st page):Dr. Moding Time of first page: 1134 MD notified (2nd page):  Time of second page  Responding MD:Dr. Moding Time MD responded: 1144

## 2014-10-13 NOTE — Progress Notes (Signed)
ABG performed in ED at 2026 by ED RT. Results, PH 6.84, CO2 14.6, O2 240, Bicard 2.5, 99% sat, BE -30. Results are not crossing over from I-stat to epic.

## 2014-10-13 NOTE — Progress Notes (Addendum)
Subjective:    Currently, the patient reports pain all over her body, including her chest, legs and stomach. She denies any diarrhea, and she says she last had a bowel movement yesterday. She does continue to report difficulty breathing.  Interval Events: -Lactic acid trending down slightly to 10.3.  -Minimal change and other labs overnight though very little time for improvement.    Objective:    Vital Signs:   Temp:  [97 F (36.1 C)-97.8 F (36.6 C)] 97.8 F (36.6 C) (05/10 0721) Pulse Rate:  [29-110] 107 (05/10 0721) Resp:  [16-32] 16 (05/10 0721) BP: (93-126)/(65-95) 104/73 mmHg (05/10 0721) SpO2:  [99 %-100 %] 100 % (05/10 0400) FiO2 (%):  [4 %] 4 % (05/09 1934) Weight:  [90 lb 13.3 oz (41.2 kg)] 90 lb 13.3 oz (41.2 kg) (05/10 0000) Last BM Date: 10/12/14  24-hour weight change: Weight change:   Intake/Output:   Intake/Output Summary (Last 24 hours) at 10/13/14 1025 Last data filed at 10/13/14 9924  Gross per 24 hour  Intake    210 ml  Output     45 ml  Net    165 ml      Physical Exam: General: Thin, cachectic appearing, confused, in no acute distress.  Lungs:  Mildly increased work of breathing, slightly diminished breath sounds at R lung base.  Heart: Tachycardic. S1 and S2 normal without gallop, murmur, or rubs.  Abdomen:  BS normoactive, diffuse abdominal tenderness.  Extremities: No pretibial edema.     Labs:  Basic Metabolic Panel:  Recent Labs Lab 10/08/14 1018 10/12/14 1939 10/12/14 2204 10/13/14 0220 10/13/14 0430  NA 133* 136  --  134*  --   K 4.2 5.3*  --  5.3*  --   CL 109 111  --  110  --   CO2 7* <5*  --  <5*  --   GLUCOSE 92 127*  --  148*  --   BUN 22 26*  --  23*  --   CREATININE 4.40* 5.10*  --  5.26*  --   CALCIUM 4.8* 6.0*  --  6.2*  --   MG 0.8*  --  1.3*  --   --   PHOS 5.4*  --   --   --  9.0*    Liver Function Tests:  Recent Labs Lab 10/08/14 1018 10/12/14 1932 10/13/14 0220  AST 47* 74* 94*  ALT 9 14 19     ALKPHOS 362* 436* 379*  BILITOT 0.4 0.7 0.6  PROT 7.4 8.0 7.3  ALBUMIN 1.6* 1.3* 1.2*    Recent Labs Lab 10/12/14 1932  LIPASE 13*   CBC:  Recent Labs Lab 10/12/14 1939 10/13/14 0220  WBC 5.2 8.4  HGB 7.9* 7.2*  HCT 26.0* 22.8*  MCV 100.4* 93.4  PLT 82* 74*    Cardiac Enzymes:  Recent Labs Lab 10/13/14 0220  TROPONINI 0.09*   Microbiology: Results for orders placed or performed during the hospital encounter of 10/12/14  MRSA PCR Screening     Status: None   Collection Time: 10/12/14 11:40 PM  Result Value Ref Range Status   MRSA by PCR NEGATIVE NEGATIVE Final    Comment:        The GeneXpert MRSA Assay (FDA approved for NASAL specimens only), is one component of a comprehensive MRSA colonization surveillance program. It is not intended to diagnose MRSA infection nor to guide or monitor treatment for MRSA infections.     Coagulation Studies:  Recent Labs  10/13/14 0340  LABPROT 24.0*  INR 2.13*   Erythrocyte Sedimentation Rate     Component Value Date/Time   ESRSEDRATE >140* 10/13/2014 0340    C-Reactive Protein     Component Value Date/Time   CRP <0.5* 06/20/2014 1108    Lactic Acid: 10.3.  Beta-hydroxybutyric acid: 0.68.  Other results: EKG: Sinus tachycardia, Q waves in V1 and V2, T-wave inversion in V3 through V6..  Imaging: Dg Chest Port 1 View  10/12/2014   CLINICAL DATA:  Increased shortness of breath.  Smoker.  EXAM: PORTABLE CHEST - 1 VIEW  COMPARISON:  09/19/2014.  FINDINGS: Enlarged cardiac silhouette with an increase in size. Mild increase in prominence of the pulmonary vasculature. Increased right pleural fluid. No significant change in a small left pleural effusion. No significant change in prominence of the interstitial markings. Old, healed bilateral humeral neck fractures.  IMPRESSION: 1. Moderate-sized right pleural effusion, increased. 2. Stable small left pleural effusion. 3. Progressive cardiomegaly and pulmonary  vascular congestion with no gross change in chronic interstitial lung disease with possible interstitial pulmonary edema.   Electronically Signed   By: Claudie Revering M.D.   On: 10/12/2014 20:21       Medications:    Infusions: . sodium chloride 10 mL/hr at 10/13/14 0131  . sodium chloride 75 mL/hr at 10/13/14 4656    Scheduled Medications: . antiseptic oral rinse  7 mL Mouth Rinse BID  . calcitRIOL  0.25 mcg Oral Daily  . calcium carbonate  6 tablet Oral TID WC  . ceFEPime (MAXIPIME) IV  500 mg Intravenous Q24H  . FLUoxetine  10 mg Oral Daily  . folic acid  1 mg Oral Daily  . magnesium chloride  2 tablet Oral Daily  . ondansetron  4 mg Intravenous Once  . sodium bicarbonate  1,300 mg Oral BID  . thiamine  100 mg Intravenous Daily  . vancomycin  500 mg Intravenous Q48H  . vitamin B-12  250 mcg Oral QPM    PRN Medications: loperamide, LORazepam, oxyCODONE   Assessment/ Plan:    Active Problems:   Alcohol abuse   FSGS (focal segmental glomerulosclerosis)   Anemia of chronic disease   Lupus (systemic lupus erythematosus)   Elevated troponin   Electrolyte abnormality   Metabolic acidosis  #Dyspnea with right pleural effusion Patient continues to saturate in the 90s on 5 L, which is slightly above her home requirement of 4 L of oxygen. Concern for a right lower lobe infiltrate with associated pleural effusion. Pro calcitonin is elevated, this could explain her dyspnea and elevated lactic acid. Concern for parapneumonic effusion given her recent HCAP that was bilateral. However, I also question whether this effusion could be related to her lupus.  Also possible that her shortness of breath may be compensatory for her metabolic acidosis. The patient has been afebrile and her white blood cell count is normal. ESR and CRP are significantly elevated further suggesting potential autoimmune etiology. -Right thoracentesis today with IR will send labs. -Continue vancomycin and cefepime  IV, day 1. -Follow-up double-stranded DNA, C3, and C4. -Supplemental oxygen to keep O2 sats greater than 92%. -Consult PT and OT.  #Diffuse pain in chest, abdomen, legs, back The patient does have chronic pain and is on opiates, but I question a possible SLE flare given her history of lupus. She is not currently on any medications for her lupus. She does continue to complain of chest pain, and her troponin was mildly elevated from prior on admission. EKG  with T-wave inversion in lateral leads. She currently denies any diarrhea. Liver function changes most likely secondary to alcohol use. UDS positive for opiates. Urinalysis with no evidence of infection. -Workup for lupus flare as above. -Trend troponins 2. -Continue oxycodone 5 mg every 4 hours when necessary. -Consider starting low-dose steroids.  #Anion gap metabolic acidosis with concurrent non-gap acidosis Likely secondary to lactic acidosis with some contribution of alcoholic ketoacidosis with elevated beta hydroxybutyric acid. ABG slightly improved on recheck. Given the minimal improvement in her lactic acid and bicarbonate, we will switch to normal saline for volume resuscitation. As for her non-gap acidosis, this could be related to her previous diarrhea versus a possible renal tubular acidosis. 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 -Normal saline at 75 mL per hour. -Trend lactic acid. -Follow-up blood culture. -Continue home sodium bicarbonate 1300 mg twice a day.  #Acute on chronic kidney disease Creatinine slightly worse since presentation, 5.26 from 5.1. Question whether worsening lupus nephritis could be contributing as well. Urinalysis unremarkable. -Workup for lupus flare as above. -Repeat labs at 9 AM area -Consider  nephrology consult. -Continue to hold home Lasix.  #Hypertension Blood pressures remain low. -Continue to hold home amlodipine, metoprolol, and  Lasix.  #Chronic diarrhea I suspect this is due to magnesium supplementation. No diarrhea currently. No suspicion for C. difficile currently. -Continue to monitor.  #Hyperkalemia/hypocalcemia/hypomagnesemia -Rechecking BMP. -Continue home calcium carbonate 1200 mg 3 times a day. -Continue home calcitriol 0.25 g daily. -Continue magnesium chloride 128 mg daily.  #Alcohol abuse -CIWA protocol.  #Depression -Continue home fluoxetine 10 mg daily.   DVT PPX - SCD's while in bed  CODE STATUS - Full.  CONSULTS PLACED - None.  DISPO - Disposition is deferred at this time, awaiting improvement of dyspnea.   Anticipated discharge in approximately 3-5 day(s).   The patient does have a current PCP Eulas Post, Brett Canales, MD) and does need an [facility] hospital follow-up appointment after discharge.    Is the [facility] hospital follow-up appointment a one-time only appointment? no.  Does the patient have transportation limitations that hinder transportation to clinic appointments? yes   SERVICE NEEDED AT [place]         Y = Yes, Blank = No PT:   OT:   RN:   Equipment:   Other:      Length of Stay: 1 day(s)   Signed: Charlesetta Shanks, MD  PGY-1, Internal Medicine Resident Pager: [phone] (7AM-5PM) 10/13/2014, 10:25 AM     --Addendum-- Arman Filter, MD, PhD Internal Medicine Intern Pager: [phone] 10/13/2014,11:59 AM  Repeat labs with K of 6.1, Mg 1.1.  Blood pressure running in the 75F systolic.  Bicarb still low at 6. Lactic acid trending down slowly. We'll bolus to increase intravascular volume and then switch back to sodium bicarbonate given her profound acidosis. -Kayexalate 45 g. -5 units of insulin and 1 amp of D50. -1 L normal saline and then sodium bicarbonate at 75 mL per hour. -Consult nephrology for advice on fluids and possibly starting steroids. -2 g magnesium sulfate. -Repeat labs at 1800.

## 2014-10-13 NOTE — Procedures (Signed)
Central Venous Dialysis Catheter Insertion Procedure Note [name] [name] [id number] [birth year]  Procedure: Insertion of Central Venous Catheter Indications: Drug and/or fluid administration and dialysis access.  Procedure Details Consent: Risks of procedure as well as the alternatives and risks of each were explained to the (patient/caregiver).  Consent for procedure obtained. Time Out: Verified patient identification, verified procedure, site/side was marked, verified correct patient position, special equipment/implants available, medications/allergies/relevent history reviewed, required imaging and test results available.  Performed  Maximum sterile technique was used including antiseptics, cap, gloves, gown, hand hygiene, mask and sheet. Skin prep: Chlorhexidine; local anesthetic administered A antimicrobial bonded/coated triple lumen catheter was placed in the right internal jugular vein using the Seldinger technique.  Evaluation Blood flow good Complications: No apparent complications Patient did tolerate procedure well. Chest X-ray ordered to verify placement.  CXR: pending.  U/S used in placement.  YACOUB,WESAM 10/13/2014, 3:57 PM

## 2014-10-13 NOTE — Progress Notes (Signed)
HD Access okay to use per PCCM.

## 2014-10-13 NOTE — Progress Notes (Signed)
CRITICAL VALUE ALERT  Critical value received:  calcium 6.2 Date of notification:  10/13/14  Time of notification: 1713  Critical value read back:yes Nurse who received alert:  Venetia Constable   MD notified (1st page):  Dr. Trudee Kuster Time of first page:  1713 MD notified (2nd page):Dr. Heber Ambridge  Time of second page:1732  Responding MD: Dr.  Ethelene Hal Time MD responded:  [phone]

## 2014-10-14 ENCOUNTER — Inpatient Hospital Stay (HOSPITAL_COMMUNITY): Payer: Medicare Other

## 2014-10-14 DIAGNOSIS — I129 Hypertensive chronic kidney disease with stage 1 through stage 4 chronic kidney disease, or unspecified chronic kidney disease: Secondary | ICD-10-CM

## 2014-10-14 DIAGNOSIS — E8770 Fluid overload, unspecified: Secondary | ICD-10-CM

## 2014-10-14 DIAGNOSIS — E785 Hyperlipidemia, unspecified: Secondary | ICD-10-CM

## 2014-10-14 DIAGNOSIS — I4581 Long QT syndrome: Secondary | ICD-10-CM

## 2014-10-14 DIAGNOSIS — R7989 Other specified abnormal findings of blood chemistry: Secondary | ICD-10-CM

## 2014-10-14 DIAGNOSIS — D631 Anemia in chronic kidney disease: Secondary | ICD-10-CM

## 2014-10-14 LAB — PROTEIN / CREATININE RATIO, URINE
Creatinine, Urine: 118.19 mg/dL
PROTEIN CREATININE RATIO: 2.03 mg/mg{creat} — AB (ref 0.00–0.15)
TOTAL PROTEIN, URINE: 240 mg/dL

## 2014-10-14 LAB — C4 COMPLEMENT: Complement C4, Body Fluid: 14 mg/dL (ref 14–44)

## 2014-10-14 LAB — CORTISOL-AM, BLOOD: CORTISOL - AM: 18 ug/dL (ref 6.7–22.6)

## 2014-10-14 LAB — BASIC METABOLIC PANEL
Anion gap: 14 (ref 5–15)
BUN: 24 mg/dL — AB (ref 6–20)
CO2: 19 mmol/L — ABNORMAL LOW (ref 22–32)
Calcium: 6 mg/dL — CL (ref 8.9–10.3)
Chloride: 107 mmol/L (ref 101–111)
Creatinine, Ser: 4.94 mg/dL — ABNORMAL HIGH (ref 0.44–1.00)
GFR, EST AFRICAN AMERICAN: 10 mL/min — AB (ref >60)
GFR, EST NON AFRICAN AMERICAN: 9 mL/min — AB (ref >60)
GLUCOSE: 161 mg/dL — AB (ref 70–99)
Potassium: 3.5 mmol/L (ref 3.5–5.1)
Sodium: 140 mmol/L (ref 135–145)

## 2014-10-14 LAB — URINE CULTURE: Colony Count: >=100000

## 2014-10-14 LAB — C3 COMPLEMENT: C3 COMPLEMENT: 52 mg/dL — AB (ref 82–167)

## 2014-10-14 LAB — TROPONIN I: TROPONIN I: 0.15 ng/mL — AB (ref <0.031)

## 2014-10-14 LAB — ANTI-DNA ANTIBODY, DOUBLE-STRANDED: ds DNA Ab: 106 IU/mL — ABNORMAL HIGH

## 2014-10-14 LAB — PH, BODY FLUID: pH, Fluid: 7

## 2014-10-14 LAB — LACTATE DEHYDROGENASE: LDH: 358 U/L — AB (ref 98–192)

## 2014-10-14 LAB — MAGNESIUM: Magnesium: 1.2 mg/dL — ABNORMAL LOW (ref 1.7–2.4)

## 2014-10-14 MED ORDER — CAMPHOR-MENTHOL 0.5-0.5 % EX LOTN
TOPICAL_LOTION | CUTANEOUS | Status: DC | PRN
  Filled 2014-10-14 (×2): qty 222

## 2014-10-14 MED ORDER — MAGNESIUM SULFATE IN D5W 10-5 MG/ML-% IV SOLN
1.0000 g | Freq: Once | INTRAVENOUS | Status: AC
  Administered 2014-10-14: 1 g via INTRAVENOUS
  Filled 2014-10-14: qty 100

## 2014-10-14 MED ORDER — SODIUM CHLORIDE 0.9 % IV SOLN
1.0000 g | Freq: Once | INTRAVENOUS | Status: AC
  Administered 2014-10-14: 1 g via INTRAVENOUS
  Filled 2014-10-14: qty 10

## 2014-10-14 MED ORDER — MAGNESIUM SULFATE 2 GM/50ML IV SOLN
2.0000 g | Freq: Once | INTRAVENOUS | Status: AC
  Administered 2014-10-14: 2 g via INTRAVENOUS
  Filled 2014-10-14: qty 50

## 2014-10-14 MED ORDER — SODIUM BICARBONATE 650 MG PO TABS
1300.0000 mg | ORAL_TABLET | Freq: Three times a day (TID) | ORAL | Status: DC
  Administered 2014-10-14 – 2014-10-18 (×13): 1300 mg via ORAL
  Filled 2014-10-14 (×15): qty 2

## 2014-10-14 NOTE — Progress Notes (Signed)
  Date: 10/14/2014  Patient name: [name] [name]  Medical record number: [id number]  Date of birth: 03-14-1956   This patient has been seen and the plan of care was discussed with the house staff. Please see their note for complete details. I concur with their findings with the following additions/corrections: [name] [name] is more alert and focused today. C/O pain all over her body. Eating well and drinking coffee when we were in room. Ex-husband and sister in room. Labs improved, vitals improved. Team spoke to renal team - concern is that she is not making urine and uremia worse than what reflected in numbers. They are preparing pt for HD incase it is needed. Pt has basic understanding of HD and is in agreement. PT today.   Bartholomew Crews, MD 10/14/2014, 11:16 AM

## 2014-10-14 NOTE — Progress Notes (Signed)
S: [name] [name] is sleepy with poor appetite. UOP has been worse, none charted but she states she used the bedpan x2.  Is still saying that she is not making much urine-  Her numbers are better but I fear we are just diluting her  . antiseptic oral rinse  7 mL Mouth Rinse BID  . calcitRIOL  0.25 mcg Oral Daily  . calcium carbonate  6 tablet Oral TID WC  . calcium gluconate 1 GM IV  1 g Intravenous Once  . ceFEPime (MAXIPIME) IV  500 mg Intravenous Q24H  . darbepoetin (ARANESP) injection - NON-DIALYSIS  100 mcg Subcutaneous Q Tue-1800  . FLUoxetine  10 mg Oral Daily  . folic acid  1 mg Oral Daily  . magnesium chloride  2 tablet Oral Daily  . magnesium sulfate 1 - 4 g bolus IVPB  2 g Intravenous Once  . ondansetron  4 mg Intravenous Once  . sodium bicarbonate  1,300 mg Oral BID  . thiamine  100 mg Intravenous Daily  . vancomycin  500 mg Intravenous Q48H  . vitamin B-12  250 mcg Oral QPM   O:Temp:  [97.6 F (36.4 C)-98.5 F (36.9 C)] 98.5 F (36.9 C) (05/11 0321) Pulse Rate:  [25-113] 94 (05/11 0746) Resp:  [14-23] 16 (05/11 0746) BP: (86-120)/(52-86) 111/77 mmHg (05/11 0746) SpO2:  [6 %-100 %] 94 % (05/11 0746) Weight:  [90 lb 6.2 oz (41 kg)] 90 lb 6.2 oz (41 kg) (05/11 0500)  Intake/Output Summary (Last 24 hours) at 10/14/14 0825 Last data filed at 10/14/14 0600  Gross per 24 hour  Intake   2690 ml  Output      0 ml  Net   2690 ml   Intake/Output: I/O last 3 completed shifts: In: 3000 [P.O.:700; I.V.:2050; IV Piggyback:250] Out: 45 [Urine:45]  Intake/Output this shift: Weight: - 0.2kg / 24 hrs  Gen: Thin 59 y.o.female in no distress CV: Sinus tachycardia, no murmur, gallop or rub, normal JVP, cap refill < 2 sec. Chest: Non-labored but increased rate on 5L O2 by [place]. Decreased bibasilar breath sounds with crackles. GI: +BS, soft, non-tender, non-distended Ext: 1+ radial pulses, palpable DP pulses bilaterally. 2+ upper thigh dependent edema without pretibial edema. Homan's  neg.  Skin: No rashes or bruising Neuro: Alert and oriented. No asterixis.  Access: R side central dialysis cath (5/10)   10/12/2014  10/13/2014 02:20 10/13/2014 09:55 10/13/2014 19:55  Lactic Acid 11.54 (HH) 10.3 (HH) 9.3 (HH) 8.0 (HH)   Recent Labs Lab 10/08/14 1018 10/12/14 1932 10/12/14 1939 10/13/14 0220 10/13/14 0430 10/13/14 0956 10/13/14 1600 10/14/14 0358  NA 133*  --  136 134*  --  137 138 140  K 4.2  --  5.3* 5.3*  --  6.1* 4.4 3.5  CL 109  --  111 110  --  111 111 107  CO2 7*  --  <5* <5*  --  6* 10* 19*  GLUCOSE 92  --  127* 148*  --  94 145* 161*  BUN 22  --  26* 23*  --  24* 27* 24*  CREATININE 4.40*  --  5.10* 5.26*  --  5.15* 5.23* 4.94*  ALBUMIN 1.6* 1.3*  --  1.2*  --   --   --   --   CALCIUM 4.8*  --  6.0* 6.2*  --  6.3* 6.2* 6.0*  PHOS 5.4*  --   --   --  9.0*  --   --   --  AST 47* 74*  --  94*  --   --   --   --   ALT 9 14  --  19  --   --   --   --    CBC:  Recent Labs Lab 10/12/14 1939 10/13/14 0220 10/13/14 0956  WBC 5.2 8.4 8.7  HGB 7.9* 7.2* 7.9*  HCT 26.0* 22.8* 24.5*  MCV 100.4* 93.4 92.8  PLT 82* 74* 54*   LFT  Recent Labs  10/12/14 1932 10/13/14 0220  PROT 8.0 7.3  ALBUMIN 1.3* 1.2*  AST 74* 94*  ALT 14 19  ALKPHOS 436* 379*  BILITOT 0.7 0.6  BILIDIR 0.2  --   IBILI 0.5  --    PT/INR  Recent Labs  10/13/14 0340  LABPROT 24.0*  INR 2.13*   PTH: Lab Results  Component Value Date   PTH 150* 09/17/2014   PTH Comment 09/17/2014   CALCIUM 6.0* 10/14/2014   CAION 0.70* 09/21/2014   PHOS 9.0* 10/13/2014   Cardiac Enzymes:  Recent Labs Lab 10/13/14 0220 10/13/14 1130 10/13/14 1600 10/13/14 2154 10/14/14 0358  TROPONINI 0.09* 0.10* 0.12* 0.14* 0.15*   Urinalysis COLORURINE YELLOW  APPEARANCEUR CLOUDY*  LABSPEC 1.017  PHURINE 5.0  GLUCOSEU NEGATIVE  GLUCOSEU NEG mg/dL  HGBUR LARGE*  BILIRUBINUR NEGATIVE  KETONESUR NEGATIVE  PROTEINUR >300*  UROBILINOGEN 0.2  NITRITE NEGATIVE  LEUKOCYTESUR NEGATIVE    Studies/Results: Dg Chest 1 View  10/13/2014   CLINICAL DATA:  RIGHT pleural effusion post thoracentesis  EXAM: CHEST  1 VIEW  COMPARISON:  10/12/2014  FINDINGS: Decreased RIGHT pleural effusion and basilar atelectasis post thoracentesis.  Enlargement of cardiac silhouette with pulmonary vascular congestion.  Stable mediastinal contours.  Persistent small LEFT pleural effusion and basilar atelectasis.  No pneumothorax post thoracentesis.  Bones demineralized unremarkable.  IMPRESSION: Decrease in RIGHT pleural effusion and basilar atelectasis post thoracentesis.  No pneumothorax.  Enlargement of cardiac silhouette with pulmonary vascular congestion.  Minimal LEFT pleural effusion and basilar atelectasis.   Electronically Signed   By: Lavonia Dana M.D.   On: 10/13/2014 13:48   Dg Chest 2 View  10/14/2014   CLINICAL DATA:  Dyspnea  EXAM: CHEST  2 VIEW  COMPARISON:  10/13/2014  FINDINGS: Mild progression of bibasilar airspace disease, possible atelectasis or pneumonia. Progression of bibasilar effusion. Mild vascular congestion without pulmonary edema.  Central venous catheter tip in the SVC without pneumothorax. Cardiac enlargement  IMPRESSION: Progression of bibasilar airspace disease and bilateral pleural effusions suggesting fluid overload. Negative for edema.   Electronically Signed   By: Franchot Gallo M.D.   On: 10/14/2014 08:10   Dg Chest Port 1 View  10/13/2014   CLINICAL DATA:  Central line placement.  EXAM: PORTABLE CHEST - 1 VIEW  COMPARISON:  10/13/2014 and at 1342 hours  FINDINGS: 1731 hours.  Right internal jugular line with tip at mid SVC.  Midline trachea. Cardiomegaly accentuated by AP portable technique. Trace right and probable trace left pleural fluid remain. No pneumothorax. Mild pulmonary venous congestion. Similar left, increased right base atelectasis.  IMPRESSION: Right internal jugular line tip at mid SVC, without pneumothorax.  Otherwise, similar appearance of pulmonary venous  congestion with probable tiny bilateral pleural effusions.   Electronically Signed   By: Abigail Miyamoto M.D.   On: 10/13/2014 17:40   Dg Chest Port 1 View  10/12/2014   CLINICAL DATA:  Increased shortness of breath.  Smoker.  EXAM: PORTABLE CHEST - 1 VIEW  COMPARISON:  09/19/2014.  FINDINGS: Enlarged cardiac silhouette with an increase in size. Mild increase in prominence of the pulmonary vasculature. Increased right pleural fluid. No significant change in a small left pleural effusion. No significant change in prominence of the interstitial markings. Old, healed bilateral humeral neck fractures.  IMPRESSION: 1. Moderate-sized right pleural effusion, increased. 2. Stable small left pleural effusion. 3. Progressive cardiomegaly and pulmonary vascular congestion with no gross change in chronic interstitial lung disease with possible interstitial pulmonary edema.   Electronically Signed   By: Claudie Revering M.D.   On: 10/12/2014 20:21   US Thoracentesis Asp Pleural Space W/img Guide  10/13/2014   INDICATION: Symptomatic Right sided pleural effusion  EXAM: US THORACENTESIS ASP PLEURAL SPACE W/IMG GUIDE  COMPARISON:  None.  MEDICATIONS: None  COMPLICATIONS: None immediate  TECHNIQUE: Informed written consent was obtained from the patients daughter after a discussion of the risks, benefits and alternatives to treatment. A timeout was performed prior to the initiation of the procedure.  Initial ultrasound scanning demonstrates a right sided pleural effusion. The lower chest was prepped and draped in the usual sterile fashion. 1% lidocaine was used for local anesthesia.  Under direct ultrasound guidance, a 19 gauge, 7-cm, Yueh catheter was introduced. An ultrasound image was saved for documentation purposes. The thoracentesis was performed. The catheter was removed and a dressing was applied. The patient tolerated the procedure well without immediate post procedural complication. The patient was escorted to have an upright  chest radiograph.  FINDINGS: A total of approximately 670 liters of serous fluid was removed. Requested samples were sent to the laboratory.  IMPRESSION: Successful ultrasound-guided right sided thoracentesis yielding 670 mls of pleural fluid.  Read by Gareth Eagle PA-C   Electronically Signed   By: Corrie Mckusick D.O.   On: 10/13/2014 13:29   ECG 5/9: Sinus tachycardia (ventricular rate 116) with rightward axis, no conduction delays, QTc 448mec, globally low voltage without peaked T waves. No ST segment changes apparent though there is significant baseline wander.   Ill 59yo female with history of SLE, CKD 4, combined CHF, and alcohol abuse here with acute worsening of significant and progressive CKD (Cr 5.1, was 3.4 at recent discharge, approximate baseline 1.5 last year), in the setting of lactic/mixed acidosis. Also with lactic/mixed acidosis and acute hypoxemia with right > left pleural effusion.   Impressions/Recommendations: Acute anuric renal failure on stage IV CKD: With a history of SLE and FSG with significant scarring on biopsy 10/29/13. Doubt would be steroid-responsive. - Trialysis catheter placed 5/10: Likely uremic; gains in bicarbonate, decreased creatinine and lactate may be mostly dilutional. Will need HD tomorrow if she continues to be oligo/anuric. BUN elevation may be more severe in setting of failure to thrive in sarcopenic woman.  - [facility]. She is able to take po  - Insert foley - dsDNA in process. 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  - Secondary HPTH/metabolic bone disease: PTH 150. Vit D 7.2 Ca: 6.3 (8.5 corrected): continue home calcitriol - Anemia of chronic disease: (hgb baseline ~8.5): Started ESA, T sat % was  high in January - Strict I/O, daily weight, avoid nephrotoxins, narrow from vancomycin when able I suspect she is uremic and that is playing a major role in her  FTT.  The problem is that all of the fluid we are giving her is likely third spacing and that we are seeing dilution- will stip IV bicarb and put her on a huge dose of PO bicarb.  I have prepared her that she may need HD tomorrow- she is open to it and hopefully it will lead to clinical improvement  Lactic/mixed metabolic acidosis: Lactate still significantly elevated but production is likely not ongoing as level is falling slowly. Unsure of source unless  Ketones also contributing. Intravascularly depleted.  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 Not certain of significance in setting of renal failure and lactic acidosis.   Acute on sub-acute respiratory failure with right-sided pleural effusion: requiring 5L O2 (4L home requirement since recent admission [4/14 - 4/20] for HCAP). Improving s/p thoracentesis. Labs sent. - Treating for HCAP with vanc/cefepime and O2 by [place]   Ryan B. Bonner Puna, MD, PGY-2 10/14/2014 8:24 AM   Patient seen and examined, agree with above note with above modifications. 59 year old BF with advanced CKD as OP and many other medical issues.  She now is essentially FTT and has a pleural effusion with an albumin of 1.2.  Her numbers are not classic for dialysis need but because she is so wasted I fear that she is uremic.  I want to see if her UOP picks up and what her numbers do but have prepared her that she likely will need HD this admit.  Vas cath placed 5/10 [name] Parish, MD 10/14/2014

## 2014-10-14 NOTE — Evaluation (Signed)
Physical Therapy Evaluation Patient Details Name: [name] [name] MRN: [id number] DOB: [birth year] Today's Date: 10/14/2014   History of Present Illness  Pt admitted with dyspnea and inability to take medications due to vomiting.  Pt also reports diarrhea. Now s/p thoracentesis and placement of central venous HD catheter. Pt discharged last week with HCAP. PMH: CKD, CHF, Lupus, pericardial window, ETOH window, HTN, depression.  Clinical Impression  Pt admitted with above diagnosis. Pt currently with functional limitations due to the deficits listed below (see PT Problem List). Pt able to stand with RW but fatigues quickly with very poor endurance for activity.  Will need NHP/  Will follow acutely.   Pt will benefit from skilled PT to increase their independence and safety with mobility to allow discharge to the venue listed below.      Follow Up Recommendations SNF;Supervision/Assistance - 24 hour    Equipment Recommendations  None recommended by PT    Recommendations for Other Services       Precautions / Restrictions Precautions Precautions: Fall Restrictions Weight Bearing Restrictions: No      Mobility  Bed Mobility Overal bed mobility: Needs Assistance Bed Mobility: Supine to Sit;Sit to Supine     Supine to sit: Min assist Sit to supine: Min guard   General bed mobility comments: assist to raise trunk, +2 to pull pt up in bed  Transfers Overall transfer level: Needs assistance Equipment used: Rolling walker (2 wheeled) Transfers: Sit to/from Stand Sit to Stand: +2 physical assistance;Min assist         General transfer comment: verbal cues for hand placement, flexed posture, assist to rise.  Unable to ambulate as once pt stood, noted BM on gown and on pt bottom.  Cleaned pt while she stood for about 2 minutes to be cleaned and needed verbal and tactile cues to keep on standing as she was fatiguing and began to flex at trunk and hips and knees.  Cleaned pt with total  assist and pt got back in bed as nurse was going to put foley in and did not want pt in chair.   Ambulation/Gait                Stairs            Wheelchair Mobility    Modified Rankin (Stroke Patients Only)       Balance Overall balance assessment: Needs assistance Sitting-balance support: No upper extremity supported;Feet supported Sitting balance-Leahy Scale: Fair     Standing balance support: During functional activity;Bilateral upper extremity supported Standing balance-Leahy Scale: Poor Standing balance comment: pt requiredd use of UE support for static standing.                              Pertinent Vitals/Pain Pain Assessment: 0-10 Pain Score: 9  Pain Location: back and sides, right arm Pain Descriptors / Indicators: Aching Pain Intervention(s): Patient requesting pain meds-RN notified;Repositioned;Monitored during session;Limited activity within patient's tolerance  VSS overall with sats >90% on 4LO2.  Pt was on 4LO2 at home.      Home Living Family/patient expects to be discharged to:: Private residence Living Arrangements: Spouse/significant other (boyfriend and 2 grandchildren, 36 and 58 years old) Available Help at Discharge: Available PRN/intermittently (boyfriend is blind) Type of Home: House Home Access: Stairs to enter Entrance Stairs-Rails: None Entrance Stairs-Number of Steps: 4 Home Layout: One level Home Equipment: Cane - single point;Shower seat Additional Comments: home O2--4L  Prior Function Level of Independence: Needs assistance   Gait / Transfers Assistance Needed: ambulated with cane, limited distances  ADL's / Homemaking Assistance Needed: says daughter sometimes helps her get dressed and bathing, pt does cooking and cleaning, boyfriend helps with some cooking  Comments: Pt stated that she walks "like a duck"     Hand Dominance   Dominant Hand: Right    Extremity/Trunk Assessment   Upper Extremity  Assessment: Defer to OT evaluation RUE Deficits / Details: moderate edema and pain   RUE Sensation:  (reports tingling)     Lower Extremity Assessment: Generalized weakness      Cervical / Trunk Assessment: Kyphotic  Communication   Communication: No difficulties  Cognition Arousal/Alertness: Awake/alert Behavior During Therapy: WFL for tasks assessed/performed Overall Cognitive Status:  (some inconsistencies when questioned about PLOF)                      General Comments      Exercises        Assessment/Plan    PT Assessment Patient needs continued PT services  PT Diagnosis Difficulty walking;Generalized weakness   PT Problem List Decreased strength;Decreased activity tolerance;Decreased balance;Decreased mobility  PT Treatment Interventions DME instruction;Balance training;Gait training;Functional mobility training;Therapeutic activities;Therapeutic exercise;Patient/family education   PT Goals (Current goals can be found in the Care Plan section) Acute Rehab PT Goals Patient Stated Goal: get stronger before going home PT Goal Formulation: With patient Time For Goal Achievement: 10/28/14 Potential to Achieve Goals: Good    Frequency Min 3X/week   Barriers to discharge Decreased caregiver support      Co-evaluation PT/OT/SLP Co-Evaluation/Treatment: Yes Reason for Co-Treatment: Complexity of the patient's impairments (multi-system involvement) PT goals addressed during session: Mobility/safety with mobility OT goals addressed during session: ADL's and self-care       End of Session Equipment Utilized During Treatment: Gait belt;Oxygen Activity Tolerance: Patient limited by fatigue Patient left: in bed;with bed alarm set;with call bell/phone within reach;with family/visitor present Nurse Communication: Mobility status;Patient requests pain meds         Time: 1029-1058 PT Time Calculation (min) (ACUTE ONLY): 29 min   Charges:   PT  Evaluation $Initial PT Evaluation Tier I: 1 Procedure     PT G CodesDenice Paradise 11-05-14, 12:08 PM Delesa Kawa,PT Acute Rehabilitation [phone] (pager)

## 2014-10-14 NOTE — Progress Notes (Signed)
Pt was active with [facility] for RN/PT.  PT/OT recommending SNF and pt agreeable.  Notified CSW.

## 2014-10-14 NOTE — Progress Notes (Signed)
CRITICAL VALUE ALERT  Critical value received:  Calcium 6.0  Date of notification:  10/14/2014  Time of notification:  0518  Critical value read back:  yes  Nurse who received alert:  Remo Lipps, RN  MD notified (1st page):  Ngo  Time of first page:  [phone]  MD notified (2nd page): known, chronic issue. Pt is receiving supplementation, no need for new orders.  Time of second page:  Responding MD:    Time MD responded:

## 2014-10-14 NOTE — Progress Notes (Addendum)
Subjective:    The patient is more alert and talkative today. She reports feeling better with improvement in her breathing. She does continue to have diffuse pain from her chest to her feet.  Interval Events: -No urine output recorded, but patient does report urinating a small amount.  -Low saturations recorded epic, but poor waveform obtained on pulse oximeter so values are false. Actually satting in 90s when pulse oximeter placed on forehead.  -Right thoracentesis with 670 mL yellow fluid yesterday.  -Chest x-ray this morning with worsening bilateral pleural effusions.  -Lost IV access, triple-lumen dialysis catheter placed as central line by PCCM.    Objective:    Vital Signs:   Temp:  [97.6 F (36.4 C)-98.5 F (36.9 C)] 98.5 F (36.9 C) (05/11 0321) Pulse Rate:  [25-111] 94 (05/11 0746) Resp:  [14-23] 16 (05/11 0746) BP: (87-120)/(52-86) 111/77 mmHg (05/11 0746) SpO2:  [6 %-94 %] 94 % (05/11 0746) Weight:  [90 lb 6.2 oz (41 kg)] 90 lb 6.2 oz (41 kg) (05/11 0500) Last BM Date: 10/13/14  24-hour weight change: Weight change: -7.1 oz (-0.2 kg)  Intake/Output:   Intake/Output Summary (Last 24 hours) at 10/14/14 1122 Last data filed at 10/14/14 0600  Gross per 24 hour  Intake   2055 ml  Output      0 ml  Net   2055 ml      Physical Exam: General: Thin, cachectic appearing, alert, in no acute distress.  Lungs:  Bilateral basilar rales.  Heart: Regular rate and rhythm, S1 and S2 normal without gallop, murmur, or rubs.  Abdomen:  BS normoactive, diffuse abdominal and chest tenderness.  Extremities: No pretibial edema.     Labs:  Basic Metabolic Panel:  Recent Labs Lab 10/08/14 1018 10/12/14 1939 10/12/14 2204 10/13/14 0220 10/13/14 0430 10/13/14 0956 10/13/14 1600 10/14/14 0358  NA 133* 136  --  134*  --  137 138 140  K 4.2 5.3*  --  5.3*  --  6.1* 4.4 3.5  CL 109 111  --  110  --  111 111 107  CO2 7* <5*  --  <5*  --  6* 10* 19*  GLUCOSE 92 127*  --   148*  --  94 145* 161*  BUN 22 26*  --  23*  --  24* 27* 24*  CREATININE 4.40* 5.10*  --  5.26*  --  5.15* 5.23* 4.94*  CALCIUM 4.8* 6.0*  --  6.2*  --  6.3* 6.2* 6.0*  MG 0.8*  --  1.3*  --   --  1.1* 1.0* 1.2*  PHOS 5.4*  --   --   --  9.0*  --   --   --     Liver Function Tests:  Recent Labs Lab 10/08/14 1018 10/12/14 1932 10/13/14 0220  AST 47* 74* 94*  ALT 9 14 19   ALKPHOS 362* 436* 379*  BILITOT 0.4 0.7 0.6  PROT 7.4 8.0 7.3  ALBUMIN 1.6* 1.3* 1.2*    Recent Labs Lab 10/12/14 1932  LIPASE 13*   CBC:  Recent Labs Lab 10/12/14 1939 10/13/14 0220 10/13/14 0956  WBC 5.2 8.4 8.7  HGB 7.9* 7.2* 7.9*  HCT 26.0* 22.8* 24.5*  MCV 100.4* 93.4 92.8  PLT 82* 74* 54*    Cardiac Enzymes:  Recent Labs Lab 10/13/14 0220 10/13/14 1130 10/13/14 1600 10/13/14 2154 10/14/14 0358  TROPONINI 0.09* 0.10* 0.12* 0.14* 0.15*   Microbiology: Results for orders placed or performed during the  hospital encounter of 10/12/14  Culture, blood (routine x 2)     Status: None (Preliminary result)   Collection Time: 10/12/14 10:04 PM  Result Value Ref Range Status   Specimen Description BLOOD LEFT ANTECUBITAL  Final   Special Requests BOTTLES DRAWN AEROBIC AND ANAEROBIC 5CC EA  Final   Culture   Final           BLOOD CULTURE RECEIVED NO GROWTH TO DATE CULTURE WILL BE HELD FOR 5 DAYS BEFORE ISSUING A FINAL NEGATIVE REPORT Performed at [facility]    Report Status PENDING  Incomplete  Culture, blood (routine x 2)     Status: None (Preliminary result)   Collection Time: 10/12/14 10:16 PM  Result Value Ref Range Status   Specimen Description BLOOD LEFT WRIST  Final   Special Requests BOTTLES DRAWN AEROBIC ONLY 3CC  Final   Culture   Final           BLOOD CULTURE RECEIVED NO GROWTH TO DATE CULTURE WILL BE HELD FOR 5 DAYS BEFORE ISSUING A FINAL NEGATIVE REPORT Note: Culture results may be compromised due to an inadequate volume of blood received in culture  bottles. Performed at [facility]    Report Status PENDING  Incomplete  MRSA PCR Screening     Status: None   Collection Time: 10/12/14 11:40 PM  Result Value Ref Range Status   MRSA by PCR NEGATIVE NEGATIVE Final    Comment:        The GeneXpert MRSA Assay (FDA approved for NASAL specimens only), is one component of a comprehensive MRSA colonization surveillance program. It is not intended to diagnose MRSA infection nor to guide or monitor treatment for MRSA infections.   Urine culture     Status: None   Collection Time: 10/13/14  4:12 AM  Result Value Ref Range Status   Specimen Description URINE, RANDOM  Final   Special Requests NONE  Final   Colony Count   Final    >=100,000 COLONIES/ML Performed at [facility]    Culture   Final    Multiple bacterial morphotypes present, none predominant. Suggest appropriate recollection if clinically indicated. Performed at [facility]    Report Status 10/14/2014 FINAL  Final  Body fluid culture     Status: None (Preliminary result)   Collection Time: 10/13/14  1:18 PM  Result Value Ref Range Status   Specimen Description FLUID RIGHT PLEURAL  Final   Special Requests NONE  Final   Gram Stain   Final    RARE WBC PRESENT, PREDOMINANTLY MONONUCLEAR NO ORGANISMS SEEN Performed at [facility]    Culture   Final    NO GROWTH 1 DAY Performed at [facility]    Report Status PENDING  Incomplete    Coagulation Studies:  Recent Labs  10/13/14 0340  LABPROT 24.0*  INR 2.13*   Pleural fluid analysis: Glucose 127, WBCs 167, protein less than 3.0, LDH 86.  Serum LDH: 358.  Am cortisol: 18.0.  C3 complement: 52.  C4 complement: 14.  Other results: EKG: Sinus rhythm, prolonged QTc of 605, T-wave inversion in V5 and V6-unchanged.  Imaging: Dg Chest 1 View  10/13/2014   CLINICAL DATA:  RIGHT pleural effusion post thoracentesis  EXAM: CHEST  1 VIEW  COMPARISON:  10/12/2014   FINDINGS: Decreased RIGHT pleural effusion and basilar atelectasis post thoracentesis.  Enlargement of cardiac silhouette with pulmonary vascular congestion.  Stable mediastinal contours.  Persistent small LEFT  pleural effusion and basilar atelectasis.  No pneumothorax post thoracentesis.  Bones demineralized unremarkable.  IMPRESSION: Decrease in RIGHT pleural effusion and basilar atelectasis post thoracentesis.  No pneumothorax.  Enlargement of cardiac silhouette with pulmonary vascular congestion.  Minimal LEFT pleural effusion and basilar atelectasis.   Electronically Signed   By: Lavonia Dana M.D.   On: 10/13/2014 13:48   Dg Chest 2 View  10/14/2014   CLINICAL DATA:  Dyspnea  EXAM: CHEST  2 VIEW  COMPARISON:  10/13/2014  FINDINGS: Mild progression of bibasilar airspace disease, possible atelectasis or pneumonia. Progression of bibasilar effusion. Mild vascular congestion without pulmonary edema.  Central venous catheter tip in the SVC without pneumothorax. Cardiac enlargement  IMPRESSION: Progression of bibasilar airspace disease and bilateral pleural effusions suggesting fluid overload. Negative for edema.   Electronically Signed   By: Franchot Gallo M.D.   On: 10/14/2014 08:10   Dg Chest Port 1 View  10/13/2014   CLINICAL DATA:  Central line placement.  EXAM: PORTABLE CHEST - 1 VIEW  COMPARISON:  10/13/2014 and at 1342 hours  FINDINGS: 1731 hours.  Right internal jugular line with tip at mid SVC.  Midline trachea. Cardiomegaly accentuated by AP portable technique. Trace right and probable trace left pleural fluid remain. No pneumothorax. Mild pulmonary venous congestion. Similar left, increased right base atelectasis.  IMPRESSION: Right internal jugular line tip at mid SVC, without pneumothorax.  Otherwise, similar appearance of pulmonary venous congestion with probable tiny bilateral pleural effusions.   Electronically Signed   By: Abigail Miyamoto M.D.   On: 10/13/2014 17:40   Dg Chest Port 1  View  10/12/2014   CLINICAL DATA:  Increased shortness of breath.  Smoker.  EXAM: PORTABLE CHEST - 1 VIEW  COMPARISON:  09/19/2014.  FINDINGS: Enlarged cardiac silhouette with an increase in size. Mild increase in prominence of the pulmonary vasculature. Increased right pleural fluid. No significant change in a small left pleural effusion. No significant change in prominence of the interstitial markings. Old, healed bilateral humeral neck fractures.  IMPRESSION: 1. Moderate-sized right pleural effusion, increased. 2. Stable small left pleural effusion. 3. Progressive cardiomegaly and pulmonary vascular congestion with no gross change in chronic interstitial lung disease with possible interstitial pulmonary edema.   Electronically Signed   By: Claudie Revering M.D.   On: 10/12/2014 20:21   US Thoracentesis Asp Pleural Space W/img Guide  10/13/2014   INDICATION: Symptomatic Right sided pleural effusion  EXAM: US THORACENTESIS ASP PLEURAL SPACE W/IMG GUIDE  COMPARISON:  None.  MEDICATIONS: None  COMPLICATIONS: None immediate  TECHNIQUE: Informed written consent was obtained from the patients daughter after a discussion of the risks, benefits and alternatives to treatment. A timeout was performed prior to the initiation of the procedure.  Initial ultrasound scanning demonstrates a right sided pleural effusion. The lower chest was prepped and draped in the usual sterile fashion. 1% lidocaine was used for local anesthesia.  Under direct ultrasound guidance, a 19 gauge, 7-cm, Yueh catheter was introduced. An ultrasound image was saved for documentation purposes. The thoracentesis was performed. The catheter was removed and a dressing was applied. The patient tolerated the procedure well without immediate post procedural complication. The patient was escorted to have an upright chest radiograph.  FINDINGS: A total of approximately 670 liters of serous fluid was removed. Requested samples were sent to the laboratory.   IMPRESSION: Successful ultrasound-guided right sided thoracentesis yielding 670 mls of pleural fluid.  Read by Gareth Eagle PA-C   Electronically Signed  By: Corrie Mckusick D.O.   On: 10/13/2014 13:29       Medications:    Infusions: . sodium chloride 10 mL/hr at 10/13/14 0131    Scheduled Medications: . antiseptic oral rinse  7 mL Mouth Rinse BID  . calcitRIOL  0.25 mcg Oral Daily  . calcium carbonate  6 tablet Oral TID WC  . darbepoetin (ARANESP) injection - NON-DIALYSIS  100 mcg Subcutaneous Q Tue-1800  . FLUoxetine  10 mg Oral Daily  . folic acid  1 mg Oral Daily  . magnesium chloride  2 tablet Oral Daily  . magnesium sulfate 1 - 4 g bolus IVPB  2 g Intravenous Once  . ondansetron  4 mg Intravenous Once  . sodium bicarbonate  1,300 mg Oral TID  . thiamine  100 mg Intravenous Daily  . vitamin B-12  250 mcg Oral QPM    PRN Medications: loperamide, LORazepam, oxyCODONE   Assessment/ Plan:    Active Problems:   Alcohol abuse   FSGS (focal segmental glomerulosclerosis)   Anemia of chronic disease   Lupus (systemic lupus erythematosus)   Elevated troponin   Electrolyte abnormality   Metabolic acidosis   Acute renal failure syndrome  #Volume overload secondary to acute on chronic kidney disease It is likely that the patient's shortness of breath is related to her worsening renal failure leading to volume retention and pleural effusions. She initially had some relief of her shortness of breath after draining pleural effusion yesterday, but chest x-ray this morning with reaccumulated pleural effusions bilaterally. Pleural fluid analysis consistent with a transudate, not consistent with lupus nephritis. Possibly greater on the right due to liver disease, but no evidence of cirrhosis on previous abdominal ultrasounds. Nephrology saw yesterday, and they're concerned that she will need hemodialysis tomorrow due to her poor urine output. She may be more uremic than her labs show due  to her severe malnutrition. Nephrology does not feel that her worsening kidney function is related to her lupus, and she recently had a biopsy which showed scarring that would not respond to steroids. Complement C3 low, but may be nonspecific in setting of renal failure and lactic acidosis. No evidence of infection currently with normal white blood cell count and patient has been afebrile since presentation. Repeat chest x-ray with no clear evidence of pneumonia. -Appreciate nephrology recommendations. -Trialysis catheter placed yesterday. -We'll need hemodialysis tomorrow if she continues to be oligo/anuric. -Stop IV fluids. -Insert Foley to monitor urine output. -Check urine protein to creatinine ratio. -Follow-up dsDNA. -Stop antibiotics and monitored for fever or worsening status. -Follow-up blood, urine, and body fluid cultures. -Supplemental oxygen to keep O2 sats greater than 92%. -Consulted PT and OT. -Continue to hold home Lasix.  #Anion gap metabolic acidosis with concurrent non-gap acidosis Lactic acid slowly improving, this will likely take some time in setting of renal failure. Etiology unclear, potentially related to intravascular depletion with suspicion for infection low. Bicarbonate improved to 19 this morning with anion gap of 14. Still possible that type I RTA could be contributing. -Stop IV sodium bicarbonate given volume overload and poor urine output. -Continue to trend lactic acid. -Follow-up blood culture. -Continue home sodium bicarbonate 1300 mg twice a day.  #Hyperkalemia/hypomagnesemia Hyperkalemia has resolved this morning. Magnesium low at 1.2 this morning. -Magnesium sulfate 2 g IV this a.m., 1 g IV this p.m. -Continue magnesium chloride 128 mg daily.  #CKD related metabolic bone disease Calcium remains slightly low at 8.2 (corrected). -Continue home calcium carbonate 1200  mg 3 times a day. -Continue home calcitriol 0.25 g daily. -Calcium gluconate 1 g  IV.  #Anemia of chronic disease High iron saturation in January. Hemoglobin currently at baseline. -Start Aranesp per nephrology.  #Prolonged QTc Most likely due to multiple electrolyte abnormalities. -Avoid QT prolonging medications. -Correct electrolytes as above. -Repeat EKG tomorrow.  #Elevated troponin Troponin continues to rise slowly, most likely due to a slow troponin leak related to renal failure and lactic acidosis. EKG with no new signs of ischemia this morning. Patient with diffuse nonspecific pain. -No further workup.  #Diffuse pain in chest, abdomen, legs, back Likely related to chronic pain. -Workup for lupus flare as above. -Continue oxycodone 5 mg every 4 hours when necessary. -We'll hold off on steroids for now.  #Hypertension -Continue to hold home amlodipine, metoprolol, and Lasix.  #Chronic diarrhea Resolved currently. -Continue to monitor.  #Alcohol abuse -CIWA protocol.  #Depression -Continue home fluoxetine 10 mg daily.   DVT PPX - SCD's while in bed  CODE STATUS - Full.  CONSULTS PLACED - None.  DISPO - Disposition is deferred at this time, awaiting improvement of dyspnea.   Anticipated discharge in approximately 3-7 day(s).   The patient does have a current PCP Eulas Post, Brett Canales, MD) and does need an [facility] hospital follow-up appointment after discharge.    Is the [facility] hospital follow-up appointment a one-time only appointment? no.  Does the patient have transportation limitations that hinder transportation to clinic appointments? yes   SERVICE NEEDED AT [facility]         Y = Yes, Blank = No PT:   OT: SNF  RN:   Equipment:   Other:      Length of Stay: 2 day(s)   Signed: Charlesetta Shanks, MD  PGY-1, Internal Medicine Resident Pager: [phone] (7AM-5PM) 10/14/2014, 11:22 AM

## 2014-10-14 NOTE — Progress Notes (Signed)
[facility]  Patient Status: Active (receiving services up to time of hospitalization)  [facility] is providing the following services: RN and PT  If patient discharges after hours, please call [phone].   [name] [name] 10/14/2014, 2:22 PM

## 2014-10-14 NOTE — Evaluation (Signed)
Occupational Therapy Evaluation Patient Details Name: [name] [name] MRN: [id number] DOB: 1956-03-17 Today's Date: 10/14/2014    History of Present Illness Pt admitted with dyspnea and inability to take medications due to vomiting.  Pt also reports diarrhea. Now s/p thoracentesis and placement of central venous HD catheter. Pt discharged last week with HCAP. PMH: CKD, CHF, Lupus, pericardial window, ETOH window, HTN, depression.   Clinical Impression   Pt was struggling to function at home since discharge last week.  She was ambulating with a cane short distances and her daughter was providing intermittent help for bathing and dressing.  Pt presents with weakness, generalized pain, R UE edema and tingling, impaired balance and decreased activity tolerance.  Responses to questions about home set up and PLOF were inconsistent. Will follow acutely.  Recommending SNF for ST rehab prior to return home.    Follow Up Recommendations  SNF    Equipment Recommendations       Recommendations for Other Services       Precautions / Restrictions Precautions Precautions: Fall Restrictions Weight Bearing Restrictions: No      Mobility Bed Mobility Overal bed mobility: Needs Assistance Bed Mobility: Supine to Sit;Sit to Supine     Supine to sit: Min assist Sit to supine: Min guard   General bed mobility comments: assist to raise trunk, +2 to pull pt up in bed  Transfers Overall transfer level: Needs assistance Equipment used: Rolling walker (2 wheeled) Transfers: Sit to/from Stand Sit to Stand: +2 physical assistance;Min assist         General transfer comment: verbal cues for hand placement, flexed posture, assist to rise    Balance Overall balance assessment: Needs assistance   Sitting balance-Leahy Scale: Fair       Standing balance-Leahy Scale: Poor                              ADL Overall ADL's : Needs assistance/impaired     Grooming:  Sitting;Moderate assistance   Upper Body Bathing: Moderate assistance;Sitting   Lower Body Bathing: Maximal assistance;Sit to/from stand   Upper Body Dressing : Minimal assistance;Sitting   Lower Body Dressing: Maximal assistance;Sit to/from stand Lower Body Dressing Details (indicate cue type and reason): pt able to start her socks over her toes     Toileting- Clothing Manipulation and Hygiene: Total assistance;Sit to/from stand Toileting - Clothing Manipulation Details (indicate cue type and reason): pt with what appeared to be dried stool, without awareness     Functional mobility during ADLs: +2 for physical assistance;Minimal assistance;Rolling walker General ADL Comments: Pt with poor activity tolerance.     Vision     Perception     Praxis      Pertinent Vitals/Pain Pain Assessment: 0-10 Pain Score: 9  Pain Location: back and sides, R arm Pain Descriptors / Indicators: Aching Pain Intervention(s): Patient requesting pain meds-RN notified;Repositioned;Monitored during session;Limited activity within patient's tolerance     Hand Dominance Right   Extremity/Trunk Assessment Upper Extremity Assessment Upper Extremity Assessment: RUE deficits/detail;Generalized weakness RUE Deficits / Details: moderate edema and pain RUE Sensation:  (reports tingling) RUE Coordination: decreased gross motor;decreased fine motor   Lower Extremity Assessment Lower Extremity Assessment: Defer to PT evaluation       Communication Communication Communication: No difficulties   Cognition Arousal/Alertness: Awake/alert Behavior During Therapy: WFL for tasks assessed/performed Overall Cognitive Status:  (some inconsistencies when questioned about PLOF)  General Comments       Exercises       Shoulder Instructions      Home Living Family/patient expects to be discharged to:: Private residence Living Arrangements: Spouse/significant other  (boyfriend and 2 grandchildren, 83 and 11 years old) Available Help at Discharge: Available PRN/intermittently (boyfriend is blind) Type of Home: House Home Access: Stairs to enter Technical brewer of Steps: 4 Entrance Stairs-Rails: None Home Layout: One level     Bathroom Shower/Tub: Teacher, early years/pre: Standard     Home Equipment: Cane - single point;Shower seat   Additional Comments: home O2--4L      Prior Functioning/Environment Level of Independence: Needs assistance  Gait / Transfers Assistance Needed: ambulated with cane, limited distances ADL's / Homemaking Assistance Needed: says daughter sometimes helps her get dressed and bathing, pt does cooking and cleaning, boyfriend helps with some cooking        OT Diagnosis: Generalized weakness;Cognitive deficits   OT Problem List: Decreased strength;Decreased activity tolerance;Impaired balance (sitting and/or standing);Decreased coordination;Decreased cognition;Decreased knowledge of use of DME or AE;Cardiopulmonary status limiting activity;Impaired UE functional use;Pain;Increased edema;Impaired sensation   OT Treatment/Interventions: Self-care/ADL training;Therapeutic activities;Patient/family education;Balance training;DME and/or AE instruction;Cognitive remediation/compensation    OT Goals(Current goals can be found in the care plan section) Acute Rehab OT Goals Patient Stated Goal: get stronger before going home OT Goal Formulation: With patient Time For Goal Achievement: 10/28/14 Potential to Achieve Goals: Good ADL Goals Pt Will Perform Grooming: with supervision;standing Pt Will Perform Upper Body Bathing: with supervision;sitting Pt Will Perform Lower Body Bathing: with supervision;sit to/from stand Pt Will Perform Upper Body Dressing: with supervision;sitting Pt Will Perform Lower Body Dressing: with supervision;sit to/from stand Pt Will Transfer to Toilet: with  supervision;ambulating;regular height toilet Pt Will Perform Toileting - Clothing Manipulation and hygiene: with supervision;sit to/from stand  OT Frequency: Min 2X/week   Barriers to D/C:            Co-evaluation PT/OT/SLP Co-Evaluation/Treatment: Yes Reason for Co-Treatment: Complexity of the patient's impairments (multi-system involvement);For patient/therapist safety   OT goals addressed during session: ADL's and self-care      End of Session Equipment Utilized During Treatment: Rolling walker;Oxygen (2L) Nurse Communication: Mobility status  Activity Tolerance: Patient limited by fatigue Patient left: in bed;with call bell/phone within reach;with nursing/sitter in room;with family/visitor present   Time: 5009-3818 OT Time Calculation (min): 26 min Charges:  OT General Charges $OT Visit: 1 Procedure OT Evaluation $Initial OT Evaluation Tier I: 1 Procedure G-Codes:    Malka So 10/14/2014, 11:23 AM  [phone]

## 2014-10-15 ENCOUNTER — Encounter: Payer: Self-pay | Admitting: Internal Medicine

## 2014-10-15 DIAGNOSIS — N250 Renal osteodystrophy: Secondary | ICD-10-CM

## 2014-10-15 DIAGNOSIS — F329 Major depressive disorder, single episode, unspecified: Secondary | ICD-10-CM

## 2014-10-15 LAB — CBC
HEMATOCRIT: 18.8 % — AB (ref 36.0–46.0)
HEMATOCRIT: 27.4 % — AB (ref 36.0–46.0)
HEMOGLOBIN: 6.3 g/dL — AB (ref 12.0–15.0)
HEMOGLOBIN: 9.1 g/dL — AB (ref 12.0–15.0)
MCH: 29.6 pg (ref 26.0–34.0)
MCH: 28.8 pg (ref 26.0–34.0)
MCHC: 33.5 g/dL (ref 30.0–36.0)
MCHC: 33.2 g/dL (ref 30.0–36.0)
MCV: 88.3 fL (ref 78.0–100.0)
MCV: 86.7 fL (ref 78.0–100.0)
PLATELETS: 37 10*3/uL — AB (ref 150–400)
Platelets: 35 10*3/uL — ABNORMAL LOW (ref 150–400)
RBC: 2.13 MIL/uL — AB (ref 3.87–5.11)
RBC: 3.16 MIL/uL — ABNORMAL LOW (ref 3.87–5.11)
RDW: 19.5 % — ABNORMAL HIGH (ref 11.5–15.5)
RDW: 18 % — ABNORMAL HIGH (ref 11.5–15.5)
WBC: 6 10*3/uL (ref 4.0–10.5)
WBC: 6.4 10*3/uL (ref 4.0–10.5)

## 2014-10-15 LAB — HEPATITIS B SURFACE ANTIBODY,QUALITATIVE: Hep B S Ab: NEGATIVE

## 2014-10-15 LAB — RENAL FUNCTION PANEL
ALBUMIN: 1 g/dL — AB (ref 3.5–5.0)
Anion gap: 11 (ref 5–15)
BUN: 24 mg/dL — AB (ref 6–20)
CALCIUM: 6.3 mg/dL — AB (ref 8.9–10.3)
CHLORIDE: 104 mmol/L (ref 101–111)
CO2: 23 mmol/L (ref 22–32)
CREATININE: 4.79 mg/dL — AB (ref 0.44–1.00)
GFR calc Af Amer: 11 mL/min — ABNORMAL LOW (ref >60)
GFR calc non Af Amer: 9 mL/min — ABNORMAL LOW (ref >60)
Glucose, Bld: 97 mg/dL (ref 65–99)
Phosphorus: 4.7 mg/dL — ABNORMAL HIGH (ref 2.5–4.6)
Potassium: 3 mmol/L — ABNORMAL LOW (ref 3.5–5.1)
SODIUM: 138 mmol/L (ref 135–145)

## 2014-10-15 LAB — GLUCOSE, CAPILLARY
GLUCOSE-CAPILLARY: 84 mg/dL (ref 65–99)
Glucose-Capillary: 103 mg/dL — ABNORMAL HIGH (ref 65–99)

## 2014-10-15 LAB — HEPATITIS B SURFACE ANTIGEN: Hepatitis B Surface Ag: NEGATIVE

## 2014-10-15 LAB — PREPARE RBC (CROSSMATCH)

## 2014-10-15 LAB — MAGNESIUM: Magnesium: 2.1 mg/dL (ref 1.7–2.4)

## 2014-10-15 MED ORDER — PREDNISONE 5 MG PO TABS
5.0000 mg | ORAL_TABLET | Freq: Every day | ORAL | Status: DC
  Administered 2014-10-15 – 2014-10-29 (×15): 5 mg via ORAL
  Filled 2014-10-15 (×20): qty 1

## 2014-10-15 MED ORDER — NEPRO/CARBSTEADY PO LIQD: 237.0000 mL | ORAL | Status: DC | PRN

## 2014-10-15 MED ORDER — SODIUM CHLORIDE 0.9 % IV SOLN: 100.0000 mL | INTRAVENOUS | Status: DC | PRN

## 2014-10-15 MED ORDER — ALTEPLASE 2 MG IJ SOLR
2.0000 mg | Freq: Once | INTRAMUSCULAR | Status: AC | PRN
  Filled 2014-10-15: qty 2

## 2014-10-15 MED ORDER — POTASSIUM CHLORIDE CRYS ER 20 MEQ PO TBCR
40.0000 meq | EXTENDED_RELEASE_TABLET | Freq: Once | ORAL | Status: AC
  Administered 2014-10-15: 40 meq via ORAL
  Filled 2014-10-15: qty 2

## 2014-10-15 MED ORDER — HEPARIN SODIUM (PORCINE) 1000 UNIT/ML DIALYSIS
1000.0000 [IU] | INTRAMUSCULAR | Status: DC | PRN
  Filled 2014-10-15: qty 1

## 2014-10-15 MED ORDER — SODIUM CHLORIDE 0.9 % IV SOLN
Freq: Once | INTRAVENOUS | Status: AC
  Administered 2014-10-15: 07:00:00 via INTRAVENOUS

## 2014-10-15 MED ORDER — LIDOCAINE-PRILOCAINE 2.5-2.5 % EX CREA
1.0000 "application " | TOPICAL_CREAM | CUTANEOUS | Status: DC | PRN
  Filled 2014-10-15: qty 5

## 2014-10-15 MED ORDER — PENTAFLUOROPROP-TETRAFLUOROETH EX AERO: 1.0000 "application " | INHALATION_SPRAY | CUTANEOUS | Status: DC | PRN

## 2014-10-15 MED ORDER — LIDOCAINE HCL (PF) 1 % IJ SOLN: 5.0000 mL | INTRAMUSCULAR | Status: DC | PRN

## 2014-10-15 NOTE — Progress Notes (Signed)
EKG completed by tech.

## 2014-10-15 NOTE — Procedures (Signed)
Patient seen on Hemodialysis. QB 400, UF goal 1.5L Treatment adjusted as needed.  Elmarie Shiley MD [facility]. Office # [phone] Pager # [phone] 3:47 PM

## 2014-10-15 NOTE — Progress Notes (Signed)
S: [name] [name] is tired again this morning. UOP still very poor. Not feeling well in general. She is ready to initiate dialysis if we feel it is needed   . sodium chloride   Intravenous Once  . antiseptic oral rinse  7 mL Mouth Rinse BID  . calcitRIOL  0.25 mcg Oral Daily  . calcium carbonate  6 tablet Oral TID WC  . darbepoetin (ARANESP) injection - NON-DIALYSIS  100 mcg Subcutaneous Q Tue-1800  . FLUoxetine  10 mg Oral Daily  . folic acid  1 mg Oral Daily  . magnesium chloride  2 tablet Oral Daily  . ondansetron  4 mg Intravenous Once  . sodium bicarbonate  1,300 mg Oral TID  . thiamine  100 mg Intravenous Daily  . vitamin B-12  250 mcg Oral QPM   O:Temp:  [97.5 F (36.4 C)-98.3 F (36.8 C)] 98.1 F (36.7 C) (05/12 0714) Pulse Rate:  [82-92] 91 (05/12 0714) Resp:  [11-19] 17 (05/12 0714) BP: (98-124)/(60-84) 107/75 mmHg (05/12 0714) SpO2:  [97 %-100 %] 100 % (05/12 0714) Weight:  [99 lb 10.4 oz (45.2 kg)] 99 lb 10.4 oz (45.2 kg) (05/12 0400)  Intake/Output Summary (Last 24 hours) at 10/15/14 0845 Last data filed at 10/15/14 0539  Gross per 24 hour  Intake      0 ml  Output    200 ml  Net   -200 ml   Intake/Output: I/O last 3 completed shifts: In: 3007 [P.O.:240; I.V.:1250; IV Piggyback:100] Out: 200 [Urine:200]  Intake/Output this shift: Weight: - 0.2kg / 24 hrs  Gen: Thin 59 y.o.female in no distress CV: Regular rate, no murmur, gallop or rub, normal JVP, cap refill < 2 sec. Chest: Non-labored, 100% on 4L. Decreased bibasilar breath sounds with crackles. GI: +BS, soft, non-tender, non-distended Ext: 1+ radial pulses, palpable DP pulses bilaterally. 2+ upper thigh dependent edema without pretibial edema. Homan's neg.  Skin: No rashes or bruising Neuro: Alert and oriented. No asterixis.  Access: R IJ central dialysis cath (5/10)   10/12/2014  10/13/2014 02:20 10/13/2014 09:55 10/13/2014 19:55  Lactic Acid 11.54 (HH) 10.3 (HH) 9.3 (HH) 8.0 (HH)    Recent Labs Lab  10/08/14 1018 10/12/14 1932 10/12/14 1939 10/13/14 0220 10/13/14 0430 10/13/14 0956 10/13/14 1600 10/14/14 0358 10/15/14 0314  NA 133*  --  136 134*  --  137 138 140 138  K 4.2  --  5.3* 5.3*  --  6.1* 4.4 3.5 3.0*  CL 109  --  111 110  --  111 111 107 104  CO2 7*  --  <5* <5*  --  6* 10* 19* 23  GLUCOSE 92  --  127* 148*  --  94 145* 161* 97  BUN 22  --  26* 23*  --  24* 27* 24* 24*  CREATININE 4.40*  --  5.10* 5.26*  --  5.15* 5.23* 4.94* 4.79*  ALBUMIN 1.6* 1.3*  --  1.2*  --   --   --   --  1.0*  CALCIUM 4.8*  --  6.0* 6.2*  --  6.3* 6.2* 6.0* 6.3*  PHOS 5.4*  --   --   --  9.0*  --   --   --  4.7*  AST 47* 74*  --  94*  --   --   --   --   --   ALT 9 14  --  19  --   --   --   --   --  CBC:  Recent Labs Lab 10/12/14 1939 10/13/14 0220 10/13/14 0956 10/15/14 0314  WBC 5.2 8.4 8.7 6.0  HGB 7.9* 7.2* 7.9* 6.3*  HCT 26.0* 22.8* 24.5* 18.8*  MCV 100.4* 93.4 92.8 88.3  PLT 82* 74* 54* 35*   LFT  Recent Labs  10/12/14 1932 10/13/14 0220 10/15/14 0314  PROT 8.0 7.3  --   ALBUMIN 1.3* 1.2* 1.0*  AST 74* 94*  --   ALT 14 19  --   ALKPHOS 436* 379*  --   BILITOT 0.7 0.6  --   BILIDIR 0.2  --   --   IBILI 0.5  --   --    PT/INR  Recent Labs  10/13/14 0340  LABPROT 24.0*  INR 2.13*   PTH: Lab Results  Component Value Date   PTH 150* 09/17/2014   PTH Comment 09/17/2014   CALCIUM 6.3* 10/15/2014   CAION 0.70* 09/21/2014   PHOS 4.7* 10/15/2014   Cardiac Enzymes:  Recent Labs Lab 10/13/14 0220 10/13/14 1130 10/13/14 1600 10/13/14 2154 10/14/14 0358  TROPONINI 0.09* 0.10* 0.12* 0.14* 0.15*   Urinalysis COLORURINE YELLOW  APPEARANCEUR CLOUDY*  LABSPEC 1.017  PHURINE 5.0  GLUCOSEU NEGATIVE  GLUCOSEU NEG mg/dL  HGBUR LARGE*  BILIRUBINUR NEGATIVE  KETONESUR NEGATIVE  PROTEINUR >300*  UROBILINOGEN 0.2  NITRITE NEGATIVE  LEUKOCYTESUR NEGATIVE   Studies/Results: Dg Chest 1 View  10/13/2014   CLINICAL DATA:  RIGHT pleural effusion post  thoracentesis  EXAM: CHEST  1 VIEW  COMPARISON:  10/12/2014  FINDINGS: Decreased RIGHT pleural effusion and basilar atelectasis post thoracentesis.  Enlargement of cardiac silhouette with pulmonary vascular congestion.  Stable mediastinal contours.  Persistent small LEFT pleural effusion and basilar atelectasis.  No pneumothorax post thoracentesis.  Bones demineralized unremarkable.  IMPRESSION: Decrease in RIGHT pleural effusion and basilar atelectasis post thoracentesis.  No pneumothorax.  Enlargement of cardiac silhouette with pulmonary vascular congestion.  Minimal LEFT pleural effusion and basilar atelectasis.   Electronically Signed   By: Lavonia Dana M.D.   On: 10/13/2014 13:48   Dg Chest 2 View  10/14/2014   CLINICAL DATA:  Dyspnea  EXAM: CHEST  2 VIEW  COMPARISON:  10/13/2014  FINDINGS: Mild progression of bibasilar airspace disease, possible atelectasis or pneumonia. Progression of bibasilar effusion. Mild vascular congestion without pulmonary edema.  Central venous catheter tip in the SVC without pneumothorax. Cardiac enlargement  IMPRESSION: Progression of bibasilar airspace disease and bilateral pleural effusions suggesting fluid overload. Negative for edema.   Electronically Signed   By: Franchot Gallo M.D.   On: 10/14/2014 08:10   Dg Chest Port 1 View  10/13/2014   CLINICAL DATA:  Central line placement.  EXAM: PORTABLE CHEST - 1 VIEW  COMPARISON:  10/13/2014 and at 1342 hours  FINDINGS: 1731 hours.  Right internal jugular line with tip at mid SVC.  Midline trachea. Cardiomegaly accentuated by AP portable technique. Trace right and probable trace left pleural fluid remain. No pneumothorax. Mild pulmonary venous congestion. Similar left, increased right base atelectasis.  IMPRESSION: Right internal jugular line tip at mid SVC, without pneumothorax.  Otherwise, similar appearance of pulmonary venous congestion with probable tiny bilateral pleural effusions.   Electronically Signed   By: Abigail Miyamoto M.D.   On: 10/13/2014 17:40   US Thoracentesis Asp Pleural Space W/img Guide  10/13/2014   INDICATION: Symptomatic Right sided pleural effusion  EXAM: US THORACENTESIS ASP PLEURAL SPACE W/IMG GUIDE  COMPARISON:  None.  MEDICATIONS: None  COMPLICATIONS: None immediate  TECHNIQUE: Informed written consent was obtained from the patients daughter after a discussion of the risks, benefits and alternatives to treatment. A timeout was performed prior to the initiation of the procedure.  Initial ultrasound scanning demonstrates a right sided pleural effusion. The lower chest was prepped and draped in the usual sterile fashion. 1% lidocaine was used for local anesthesia.  Under direct ultrasound guidance, a 19 gauge, 7-cm, Yueh catheter was introduced. An ultrasound image was saved for documentation purposes. The thoracentesis was performed. The catheter was removed and a dressing was applied. The patient tolerated the procedure well without immediate post procedural complication. The patient was escorted to have an upright chest radiograph.  FINDINGS: A total of approximately 670 liters of serous fluid was removed. Requested samples were sent to the laboratory.  IMPRESSION: Successful ultrasound-guided right sided thoracentesis yielding 670 mls of pleural fluid.  Read by Gareth Eagle PA-C   Electronically Signed   By: Corrie Mckusick D.O.   On: 10/13/2014 13:29   ECG 5/9: Sinus tachycardia (ventricular rate 116) with rightward axis, no conduction delays, QTc 458mec, globally low voltage without peaked T waves. No ST segment changes apparent though there is significant baseline wander.   Ill 59yo female with history of SLE, CKD 4, combined CHF, and alcohol abuse here with acute worsening of significant and progressive CKD, in the setting of lactic/mixed acidosis and failure to thrive since recent hospitalization for HCAP.   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 Doubt would be steroid-responsive. UPC 2.03. Complement low, ESR very high. - Trialysis catheter placed 5/10: Likely uremic which may be contributing to failure to thrive; with just 200cc UOP/24hrs, she will need HD today- her numbers just dont look "uremic" because she is so malnourished - dsDNA in process. - Hyperkalemia: resolved.  - Secondary HPTH/metabolic bone disease: PTH 150. Vit D 7.2: continue home calcitriol - Anemia of chronic disease: (hgb baseline ~8.5): Started ESA, T sat % was high in January. Hgb dropped to 6.3 overnight: transfusion this AM. Can transfuse with HD today  - Strict I/O (foley in place 5/11), daily weight, avoid nephrotoxins, narrow from vancomycin when able  Metabolic acidosis: Lactate still significantly elevated, last checked 5/10 - High dose oral bicarb supplement - Follow cultures  SLE: Hypocomplementemia (C3 decreased more than C4), ESR noted. Not certain of significance in setting of renal failure and lactic acidosis.   Acute on sub-acute respiratory failure with right-sided pleural effusion: requiring 5L O2 (4L home requirement since recent admission [4/14 - 4/20] for HCAP). Improving s/p thoracentesis. Labs sent. - Treating for HCAP with vanc/cefepime and O2 by [place]   Ryan B. GBonner Puna MD, PGY-2 10/15/2014 8:45 AM   Patient seen and examined, agree with above note with above modifications. Ms. JGuevarracontinues to lood the same- FTT- only made 200 of urine and creatinine very high in someone with low muscle mass.  I sincerely hope that the initiation of HD will help her clinical picture although her now pancytopenia is difficult to explain and does not indicate a good prognosis KCorliss Parish MD 10/15/2014

## 2014-10-15 NOTE — Progress Notes (Addendum)
CRITICAL VALUE ALERT  Critical value received:  Calcium 6.3  Date of notification:  10/15/2014  Time of notification:  0425  Critical value read back: Yes  Nurse who received alert:  Alessandra Grout, RN  MD notified (1st page):  Internal Medicine Teaching Services On-call  Time of first page:  [phone]  MD notified (2nd page):  Time of second page:  Responding MD:  Posey Pronto, MD  Time MD responded:

## 2014-10-15 NOTE — Progress Notes (Addendum)
Subjective:    The patient continues to feel better in terms of her shortness of breath and energy. Today, she complains of pain in her fingers and toes with no chest or abdominal pain currently. She is willing to undergo dialysis today. She reports her itching is improved after applying Sarna lotion.  Interval Events: -Continued poor urine output with 200 mL out. -Afebrile and vital signs stable.   Objective:    Vital Signs:   Temp:  [97.5 F (36.4 C)-98.3 F (36.8 C)] 98.1 F (36.7 C) (05/12 0714) Pulse Rate:  [82-92] 91 (05/12 0714) Resp:  [11-19] 17 (05/12 0714) BP: (98-124)/(60-84) 107/75 mmHg (05/12 0714) SpO2:  [97 %-100 %] 100 % (05/12 0714) Weight:  [99 lb 10.4 oz (45.2 kg)] 99 lb 10.4 oz (45.2 kg) (05/12 0400) Last BM Date: 10/13/14  24-hour weight change: Weight change: 9 lb 4.2 oz (4.2 kg)  Intake/Output:   Intake/Output Summary (Last 24 hours) at 10/15/14 1123 Last data filed at 10/15/14 0539  Gross per 24 hour  Intake      0 ml  Output    200 ml  Net   -200 ml      Physical Exam: General: Thin, cachectic appearing, alert, in no acute distress.  Lungs:  Bilateral basilar rales.  Heart: Regular rate and rhythm, S1 and S2 normal without gallop, murmur, or rubs.  Abdomen:  BS normoactive, nontender, nondistended.  Extremities: No pretibial edema, pain with palpation of fingers bilaterally.     Labs:  Basic Metabolic Panel:  Recent Labs Lab 10/12/14 2204 10/13/14 0220 10/13/14 0430 10/13/14 0956 10/13/14 1600 10/14/14 0358 10/15/14 0314  NA  --  134*  --  137 138 140 138  K  --  5.3*  --  6.1* 4.4 3.5 3.0*  CL  --  110  --  111 111 107 104  CO2  --  <5*  --  6* 10* 19* 23  GLUCOSE  --  148*  --  94 145* 161* 97  BUN  --  23*  --  24* 27* 24* 24*  CREATININE  --  5.26*  --  5.15* 5.23* 4.94* 4.79*  CALCIUM  --  6.2*  --  6.3* 6.2* 6.0* 6.3*  MG 1.3*  --   --  1.1* 1.0* 1.2* 2.1  PHOS  --   --  9.0*  --   --   --  4.7*    Liver Function  Tests:  Recent Labs Lab 10/12/14 1932 10/13/14 0220 10/15/14 0314  AST 74* 94*  --   ALT 14 19  --   ALKPHOS 436* 379*  --   BILITOT 0.7 0.6  --   PROT 8.0 7.3  --   ALBUMIN 1.3* 1.2* 1.0*    Recent Labs Lab 10/12/14 1932  LIPASE 13*   CBC:  Recent Labs Lab 10/12/14 1939 10/13/14 0220 10/13/14 0956 10/15/14 0314  WBC 5.2 8.4 8.7 6.0  HGB 7.9* 7.2* 7.9* 6.3*  HCT 26.0* 22.8* 24.5* 18.8*  MCV 100.4* 93.4 92.8 88.3  PLT 82* 74* 54* 35*    Cardiac Enzymes:  Recent Labs Lab 10/13/14 0220 10/13/14 1130 10/13/14 1600 10/13/14 2154 10/14/14 0358  TROPONINI 0.09* 0.10* 0.12* 0.14* 0.15*   Microbiology: Results for orders placed or performed during the hospital encounter of 10/12/14  Culture, blood (routine x 2)     Status: None (Preliminary result)   Collection Time: 10/12/14 10:04 PM  Result Value Ref Range Status  Specimen Description BLOOD LEFT ANTECUBITAL  Final   Special Requests BOTTLES DRAWN AEROBIC AND ANAEROBIC 5CC EA  Final   Culture   Final           BLOOD CULTURE RECEIVED NO GROWTH TO DATE CULTURE WILL BE HELD FOR 5 DAYS BEFORE ISSUING A FINAL NEGATIVE REPORT Performed at [facility]    Report Status PENDING  Incomplete  Culture, blood (routine x 2)     Status: None (Preliminary result)   Collection Time: 10/12/14 10:16 PM  Result Value Ref Range Status   Specimen Description BLOOD LEFT WRIST  Final   Special Requests BOTTLES DRAWN AEROBIC ONLY 3CC  Final   Culture   Final           BLOOD CULTURE RECEIVED NO GROWTH TO DATE CULTURE WILL BE HELD FOR 5 DAYS BEFORE ISSUING A FINAL NEGATIVE REPORT Note: Culture results may be compromised due to an inadequate volume of blood received in culture bottles. Performed at [facility]    Report Status PENDING  Incomplete  MRSA PCR Screening     Status: None   Collection Time: 10/12/14 11:40 PM  Result Value Ref Range Status   MRSA by PCR NEGATIVE NEGATIVE Final    Comment:        The  GeneXpert MRSA Assay (FDA approved for NASAL specimens only), is one component of a comprehensive MRSA colonization surveillance program. It is not intended to diagnose MRSA infection nor to guide or monitor treatment for MRSA infections.   Urine culture     Status: None   Collection Time: 10/13/14  4:12 AM  Result Value Ref Range Status   Specimen Description URINE, RANDOM  Final   Special Requests NONE  Final   Colony Count   Final    >=100,000 COLONIES/ML Performed at [facility]    Culture   Final    Multiple bacterial morphotypes present, none predominant. Suggest appropriate recollection if clinically indicated. Performed at [facility]    Report Status 10/14/2014 FINAL  Final  Body fluid culture     Status: None (Preliminary result)   Collection Time: 10/13/14  1:18 PM  Result Value Ref Range Status   Specimen Description FLUID RIGHT PLEURAL  Final   Special Requests NONE  Final   Gram Stain   Final    RARE WBC PRESENT, PREDOMINANTLY MONONUCLEAR NO ORGANISMS SEEN Performed at [facility]    Culture   Final    NO GROWTH 1 DAY Performed at [facility]    Report Status PENDING  Incomplete    Coagulation Studies:  Recent Labs  10/13/14 0340  LABPROT 24.0*  INR 2.13*    Anti-dsDNA: 106.  Protein/creatinine ratio: 2.03.  Imaging: Dg Chest 1 View  10/13/2014   CLINICAL DATA:  RIGHT pleural effusion post thoracentesis  EXAM: CHEST  1 VIEW  COMPARISON:  10/12/2014  FINDINGS: Decreased RIGHT pleural effusion and basilar atelectasis post thoracentesis.  Enlargement of cardiac silhouette with pulmonary vascular congestion.  Stable mediastinal contours.  Persistent small LEFT pleural effusion and basilar atelectasis.  No pneumothorax post thoracentesis.  Bones demineralized unremarkable.  IMPRESSION: Decrease in RIGHT pleural effusion and basilar atelectasis post thoracentesis.  No pneumothorax.  Enlargement of cardiac silhouette  with pulmonary vascular congestion.  Minimal LEFT pleural effusion and basilar atelectasis.   Electronically Signed   By: Lavonia Dana M.D.   On: 10/13/2014 13:48   Dg Chest 2 View  10/14/2014  CLINICAL DATA:  Dyspnea  EXAM: CHEST  2 VIEW  COMPARISON:  10/13/2014  FINDINGS: Mild progression of bibasilar airspace disease, possible atelectasis or pneumonia. Progression of bibasilar effusion. Mild vascular congestion without pulmonary edema.  Central venous catheter tip in the SVC without pneumothorax. Cardiac enlargement  IMPRESSION: Progression of bibasilar airspace disease and bilateral pleural effusions suggesting fluid overload. Negative for edema.   Electronically Signed   By: Franchot Gallo M.D.   On: 10/14/2014 08:10   Dg Chest Port 1 View  10/13/2014   CLINICAL DATA:  Central line placement.  EXAM: PORTABLE CHEST - 1 VIEW  COMPARISON:  10/13/2014 and at 1342 hours  FINDINGS: 1731 hours.  Right internal jugular line with tip at mid SVC.  Midline trachea. Cardiomegaly accentuated by AP portable technique. Trace right and probable trace left pleural fluid remain. No pneumothorax. Mild pulmonary venous congestion. Similar left, increased right base atelectasis.  IMPRESSION: Right internal jugular line tip at mid SVC, without pneumothorax.  Otherwise, similar appearance of pulmonary venous congestion with probable tiny bilateral pleural effusions.   Electronically Signed   By: Abigail Miyamoto M.D.   On: 10/13/2014 17:40   US Thoracentesis Asp Pleural Space W/img Guide  10/13/2014   INDICATION: Symptomatic Right sided pleural effusion  EXAM: US THORACENTESIS ASP PLEURAL SPACE W/IMG GUIDE  COMPARISON:  None.  MEDICATIONS: None  COMPLICATIONS: None immediate  TECHNIQUE: Informed written consent was obtained from the patients daughter after a discussion of the risks, benefits and alternatives to treatment. A timeout was performed prior to the initiation of the procedure.  Initial ultrasound scanning demonstrates  a right sided pleural effusion. The lower chest was prepped and draped in the usual sterile fashion. 1% lidocaine was used for local anesthesia.  Under direct ultrasound guidance, a 19 gauge, 7-cm, Yueh catheter was introduced. An ultrasound image was saved for documentation purposes. The thoracentesis was performed. The catheter was removed and a dressing was applied. The patient tolerated the procedure well without immediate post procedural complication. The patient was escorted to have an upright chest radiograph.  FINDINGS: A total of approximately 670 liters of serous fluid was removed. Requested samples were sent to the laboratory.  IMPRESSION: Successful ultrasound-guided right sided thoracentesis yielding 670 mls of pleural fluid.  Read by Gareth Eagle PA-C   Electronically Signed   By: Corrie Mckusick D.O.   On: 10/13/2014 13:29       Medications:    Infusions: . sodium chloride 10 mL/hr at 10/13/14 0131    Scheduled Medications: . antiseptic oral rinse  7 mL Mouth Rinse BID  . calcitRIOL  0.25 mcg Oral Daily  . calcium carbonate  6 tablet Oral TID WC  . darbepoetin (ARANESP) injection - NON-DIALYSIS  100 mcg Subcutaneous Q Tue-1800  . FLUoxetine  10 mg Oral Daily  . folic acid  1 mg Oral Daily  . magnesium chloride  2 tablet Oral Daily  . ondansetron  4 mg Intravenous Once  . potassium chloride  40 mEq Oral Once  . sodium bicarbonate  1,300 mg Oral TID  . thiamine  100 mg Intravenous Daily  . vitamin B-12  250 mcg Oral QPM    PRN Medications: camphor-menthol, loperamide, LORazepam, oxyCODONE   Assessment/ Plan:    Active Problems:   Alcohol abuse   FSGS (focal segmental glomerulosclerosis)   Anemia of chronic disease   Lupus (systemic lupus erythematosus)   Elevated troponin   Electrolyte abnormality   Metabolic acidosis   Acute  renal failure syndrome  #Volume overload secondary to acute on chronic kidney disease Continued poor urine output with minimal improvement  in serum creatinine. Uremia could explain some of her decreased mental status, along with her skin itching. Nephrology plans to dialyze today. Urine protein to creatinine ratio of 2.03 elevated but does not suggest nephrotic range proteinuria. Oxygen saturation stable. No growth on cultures. -Dialysis per today per nephrology. -Patient will need outpatient dialysis placement. -Discontinue Foley after starting dialysis. -Continue to monitor for fever. -Follow-up blood, urine, and body fluid cultures. -Supplemental oxygen to keep O2 sats greater than 92%. -Continue to hold home Lasix.  #Anion gap metabolic acidosis with concurrent non-gap acidosis Anion gap closely with normal bicarbonate. -Continue home sodium bicarbonate 1300 mg 3 times a day.  #Electrolyte abnormalities Magnesium normal this morning with hypokalemia. -Kdur 40 mEq once. -Continue to monitor magnesium and BMP.  #CKD related metabolic bone disease Calcium stable. -Continue home calcium carbonate 1200 mg 3 times a day. -Continue home calcitriol 0.25 g daily.  #Anemia of chronic disease Hemoglobin down to 6.3 overnight, possibly dilutional. -Transfuse 1 unit RBCs. -Check post transfusion CBC, goal hemoglobin greater than 7. -Continue Aranesp per nephrology.  #Prolonged QTc Most likely due to multiple electrolyte abnormalities. -Avoid QT prolonging medications. -Repeat EKG today.  #Diffuse pain-question lupus flare Reduced C3 and elevated dsDNA are suspicious for lupus flare, but patient's pain is inconsistent and atypical. -Continue oxycodone 5 mg every 4 hours when necessary. -Continue to hold off on steroids for now.  #Hypertension Blood pressure remained slightly low. -Continue to hold home amlodipine, metoprolol, and Lasix.  #Alcohol abuse -CIWA protocol.  #Depression -Continue home fluoxetine 10 mg daily.   DVT PPX - SCD's while in bed  CODE STATUS - Full.  CONSULTS PLACED -  nephrology.  DISPO - Disposition is deferred at this time, awaiting improvement of dyspnea.   Anticipated discharge in approximately 3-7 day(s).   The patient does have a current PCP E[facility] Post, Brett Canales, MD) and does need an [facility] hospital follow-up appointment after discharge.    Is the [facility] hospital follow-up appointment a one-time only appointment? no.  Does the patient have transportation limitations that hinder transportation to clinic appointments? yes   SERVICE NEEDED AT [place]         Y = Yes, Blank = No PT: SNF  OT: SNF  RN:   Equipment:   Other:      Length of Stay: 3 day(s)   Signed: Charlesetta Shanks, MD  PGY-1, Internal Medicine Resident Pager: [phone] (7AM-5PM) 10/15/2014, 11:23 AM    --Addendum-- Arman Filter, MD, PhD Internal Medicine Intern Pager: [phone] 10/15/2014,1:59 PM  Called Dr. Charlestine Night of rheumatology who the patient had been referred to.  The patient was never seen in their office.  She came to an appointment but left before being seen because she was tired of waiting.  Given her elevated dsDNA, and low C3 along with highly positive ANA and positive anti-Smith antibody previously, along with her arthritis and renal manifestations, she meets the diagnosis of SLE.  Will start low-dose steroids to see if this improves her pain. -Prednisone 5 mg daily. -Consider starting Plaquenil tomorrow as well.

## 2014-10-15 NOTE — Care Management Note (Signed)
Case Management Note  Patient Details  Name: [name] [name] MRN: [id number] Date of Birth: 22-Nov-1955  Subjective/Objective:                    Action/Plan:   Expected Discharge Date:                  Expected Discharge Plan:  Skilled Nursing Facility  In-House Referral:  Clinical Social Work  Discharge planning Services  CM Consult  Post Acute Care Choice:    Choice offered to:     DME Arranged:    DME Agency:     HH Arranged:    [place] Agency:     Status of Service:  In process, will continue to follow  Medicare Important Message Given:  Yes Date Medicare IM Given:  10/15/14 Medicare IM give by:  debbie Mackenzee Becvar rn,bsn Date Additional Medicare IM Given:    Additional Medicare Important Message give by:     If discussed at [place] of Stay Meetings, dates discussed:    Additional Comments:  Lacretia Leigh, RN 10/15/2014, 3:56 PM

## 2014-10-15 NOTE — Progress Notes (Addendum)
CRITICAL VALUE ALERT  Critical value received:  Hemoglobin 6.3  Date of notification:  10/15/2014   Time of notification:  0450  Critical value read back: YES  Nurse who received alert:  Sherryl Manges, RN  MD notified (1st page):  Internal Medicine Teaching Services On-call  Time of first page:  [phone]  MD notified (2nd page):  Time of second page:  Responding MD:  Posey Pronto, MD  Time MD responded:

## 2014-10-15 NOTE — Progress Notes (Signed)
Pt returned to room 2C12 from dialysis via bed, accompanied by RN.  Report received at the [facility] from [place], [place].  Pt settled in bed and dinner tray set up.  Pt denied other needs.  No distress noted.  Nursing staff will continue to monitor.

## 2014-10-15 NOTE — Progress Notes (Signed)
  Date: 10/15/2014  Patient name: [name] [name]  Medical record number: [id number]  Date of birth: [birth year]   This patient has been seen and the plan of care was discussed with the house staff. Please see their note for complete details. I concur with their findings with the following additions/corrections: [name] [name] cont to be alert and interactive. She states the whole body pain is now resolved and just has pain of hands and feet. States lost L great toenail per accident recently. Vitals and labs cont to stabilize. HRRR, Lungs decent air flow but with crackles at bases B, ext trace B LE, +2 DP pulses B. Toes - no erythema, no pain to passive movement, no sores, L great toenail parietally removed.   1. Metabolic gap acidosis with a non gap acidosis with appropriate resp compensation - resolve although LA remains elevated at 8. Off bicarb gtt.   2. Acute hypoxic resp distress - HCAP ABX stopped yesterday and remains afebrile and no leukocytosis. On 4 L and sat 100%. Will titrate down O2. Crackles likely represent vol overload (wt up 9 lbs and +2.8 L) but HD will help.  3. Diffuse pain - improving and now only hands and feet. dsDNA quite high at 106. Hydroxychloroquine would be indicated if dx was confirmed by Dr Greer Ee (referrer there Jan) as it decreases flares and improves survival. Will request his records before starting med. No steroids now until confirm dx. Pt is on opioids at home. Oxycodone PRN.  4. Acute on chronic kidney disease - renal likely will take pt to HD today as only 200 cc urine in 24 hrs. Will follow their final recs.   5. New R pleural effusion - s/p paracentesis = transudate. Likely 2/2 renal failure, liver dz, and low alb.   Bartholomew Crews, MD 10/15/2014, 11:15 AM

## 2014-10-15 NOTE — Clinical Social Work Note (Signed)
Clinical Social Work Assessment  Patient Details  Name: [name] [name] MRN: [id number] Date of Birth: [birth year]  Date of referral:  10/15/14               Reason for consult:  Facility Placement                Permission sought to share information with:  Chartered certified accountant granted to share information::  Yes, Verbal Permission Granted  Name::        Agency::  [facility] [facility]  Relationship::     Contact Information:     Housing/Transportation Living arrangements for the past 2 months:  Single Family Home Source of Information:  Patient Patient Interpreter Needed:  None Criminal Activity/Legal Involvement Pertinent to Current Situation/Hospitalization:  No - Comment as needed Significant Relationships:  Other Family Members, Friend Lives with:  Other (Comment) (grandchildren) Do you feel safe going back to the place where you live?  No (physically unable to at this time) Need for family participation in patient care:  No (Coment)  Care giving concerns:  Patient lives with two grandchildren and a friend- patient does not feel that she will be able to return straight home now that she is about to start dialysis.   Social Worker assessment / plan:  CSW spoke with patient concerning SNF placement for rehab.  Employment status:  Other Network engineer) (unknown) Insurance information:  Medicaid In [place], [employer] PT Recommendations:  [place] / Referral to community resources:  [place]  Patient/Family's Response to care:  Patient prefers to return home but understands that she is unable to get around well enough at this point to return home  Patient/Family's Understanding of and Emotional Response to Diagnosis, Current Treatment, and Prognosis: Patient seems to have a poor understanding of her current medical condition and prognosis- used the term "chemo" to describe her starting dialysis today.  Emotional  Assessment Appearance:  Appears older than stated age Attitude/Demeanor/Rapport:    Affect (typically observed):  Appropriate, Quiet Orientation:  Oriented to Self, Oriented to Place, Oriented to  Time, Oriented to Situation Alcohol / Substance use:  Alcohol Use Psych involvement (Current and /or in the community):  No (Comment)  Discharge Needs  Concerns to be addressed:  Home Safety Concerns Readmission within the last 30 days:  Yes Current discharge risk:  Physical Impairment Barriers to Discharge:  Waiting for outpatient dialysis, Continued Medical Work up   Cranford Mon, LCSW 10/15/2014, 3:54 PM

## 2014-10-16 DIAGNOSIS — I120 Hypertensive chronic kidney disease with stage 5 chronic kidney disease or end stage renal disease: Secondary | ICD-10-CM

## 2014-10-16 DIAGNOSIS — N185 Chronic kidney disease, stage 5: Secondary | ICD-10-CM

## 2014-10-16 DIAGNOSIS — Z992 Dependence on renal dialysis: Secondary | ICD-10-CM

## 2014-10-16 LAB — TYPE AND SCREEN
ABO/RH(D): A POS
Antibody Screen: NEGATIVE
Unit division: 0

## 2014-10-16 LAB — RENAL FUNCTION PANEL
Albumin: 1.1 g/dL — ABNORMAL LOW (ref 3.5–5.0)
Anion gap: 8 (ref 5–15)
BUN: 10 mg/dL (ref 6–20)
CHLORIDE: 102 mmol/L (ref 101–111)
CO2: 25 mmol/L (ref 22–32)
CREATININE: 2.79 mg/dL — AB (ref 0.44–1.00)
Calcium: 6.7 mg/dL — ABNORMAL LOW (ref 8.9–10.3)
GFR calc non Af Amer: 18 mL/min — ABNORMAL LOW (ref >60)
GFR, EST AFRICAN AMERICAN: 21 mL/min — AB (ref >60)
Glucose, Bld: 172 mg/dL — ABNORMAL HIGH (ref 65–99)
PHOSPHORUS: 2.6 mg/dL (ref 2.5–4.6)
POTASSIUM: 3.8 mmol/L (ref 3.5–5.1)
Sodium: 135 mmol/L (ref 135–145)

## 2014-10-16 LAB — CBC
HCT: 25.4 % — ABNORMAL LOW (ref 36.0–46.0)
Hemoglobin: 8.4 g/dL — ABNORMAL LOW (ref 12.0–15.0)
MCH: 29.1 pg (ref 26.0–34.0)
MCHC: 33.1 g/dL (ref 30.0–36.0)
MCV: 87.9 fL (ref 78.0–100.0)
Platelets: 38 10*3/uL — ABNORMAL LOW (ref 150–400)
RBC: 2.89 MIL/uL — ABNORMAL LOW (ref 3.87–5.11)
RDW: 18.2 % — ABNORMAL HIGH (ref 11.5–15.5)
WBC: 6.5 10*3/uL (ref 4.0–10.5)

## 2014-10-16 LAB — OTHER BODY FLUID CHEMISTRY

## 2014-10-16 LAB — HEPATITIS B CORE ANTIBODY, TOTAL: Hep B Core Total Ab: NEGATIVE

## 2014-10-16 LAB — MAGNESIUM: Magnesium: 1.8 mg/dL (ref 1.7–2.4)

## 2014-10-16 MED ORDER — VITAMIN B-1 100 MG PO TABS
100.0000 mg | ORAL_TABLET | Freq: Every day | ORAL | Status: DC
  Administered 2014-10-16 – 2014-10-18 (×3): 100 mg via ORAL
  Filled 2014-10-16 (×4): qty 1

## 2014-10-16 MED ORDER — HYDROXYCHLOROQUINE SULFATE 200 MG PO TABS
400.0000 mg | ORAL_TABLET | Freq: Every day | ORAL | Status: DC
  Administered 2014-10-16 – 2014-10-29 (×14): 400 mg via ORAL
  Filled 2014-10-16 (×15): qty 2

## 2014-10-16 NOTE — Procedures (Signed)
Patient was seen on dialysis and the procedure was supervised.  BFR 300  Via vc BP is  112/78.   Patient appears to be tolerating treatment well  Cami Delawder A 10/16/2014

## 2014-10-16 NOTE — Progress Notes (Signed)
S: [name] [name] feels HD may have helped her breathing. Oxygen requirement down. UOP still poor. Seen in HD getting 2nd treatment  . antiseptic oral rinse  7 mL Mouth Rinse BID  . calcitRIOL  0.25 mcg Oral Daily  . calcium carbonate  6 tablet Oral TID WC  . darbepoetin (ARANESP) injection - NON-DIALYSIS  100 mcg Subcutaneous Q Tue-1800  . FLUoxetine  10 mg Oral Daily  . folic acid  1 mg Oral Daily  . magnesium chloride  2 tablet Oral Daily  . ondansetron  4 mg Intravenous Once  . predniSONE  5 mg Oral Q breakfast  . sodium bicarbonate  1,300 mg Oral TID  . thiamine  100 mg Oral Daily  . vitamin B-12  250 mcg Oral QPM   O:Temp:  [98 F (36.7 C)-98.9 F (37.2 C)] 98.9 F (37.2 C) (05/13 0400) Pulse Rate:  [91-105] 94 (05/13 0600) Resp:  [13-16] 16 (05/13 0600) BP: (102-127)/(74-90) 125/82 mmHg (05/13 0600) SpO2:  [87 %-100 %] 100 % (05/13 0600) Weight:  [99 lb 3.3 oz (45 kg)-101 lb 13.6 oz (46.2 kg)] 101 lb 13.6 oz (46.2 kg) (05/13 0400)  Intake/Output Summary (Last 24 hours) at 10/16/14 0800 Last data filed at 10/16/14 0437  Gross per 24 hour  Intake    930 ml  Output   1375 ml; 39m UOP  Net   -445 ml   Intake/Output: I/O last 3 completed shifts: In: 930 [P.O.:600; Blood:330] Out: 13614[Urine:475; Other:1000]  Intake/Output this shift: Pre/post HD change: 1 kg (5/12) [1.175L off] + 1 kg overnight   Gen: Thin 59y.o.female in no distress receiving HD CV: Regular rate, no murmur, gallop or rub, normal JVP Chest: Non-labored, 100% on 3.5L. Good air movement, bibasilar crackles. GI: +BS, soft, non-tender, non-distended Ext: 1+ radial pulses, palpable DP pulses bilaterally. 2+ dependent pitting edema. Skin: No rashes or bruising Neuro: Alert and oriented. No asterixis.  Access: R IJ central dialysis cath (5/10)   10/12/2014  10/13/2014 02:20 10/13/2014 09:55 10/13/2014 19:55  Lactic Acid 11.54 (HH) 10.3 (HH) 9.3 (HH) 8.0 (HH)    Recent Labs Lab 10/12/14 1932  10/12/14 1939 10/13/14 0220 10/13/14 0430 10/13/14 0956 10/13/14 1600 10/14/14 0358 10/15/14 0314 10/16/14 0217  NA  --  136 134*  --  137 138 140 138 135  K  --  5.3* 5.3*  --  6.1* 4.4 3.5 3.0* 3.8  CL  --  111 110  --  111 111 107 104 102  CO2  --  <5* <5*  --  6* 10* 19* 23 25  GLUCOSE  --  127* 148*  --  94 145* 161* 97 172*  BUN  --  26* 23*  --  24* 27* 24* 24* 10  CREATININE  --  5.10* 5.26*  --  5.15* 5.23* 4.94* 4.79* 2.79*  ALBUMIN 1.3*  --  1.2*  --   --   --   --  1.0* 1.1*  CALCIUM  --  6.0* 6.2*  --  6.3* 6.2* 6.0* 6.3* 6.7*  PHOS  --   --   --  9.0*  --   --   --  4.7* 2.6  AST 74*  --  94*  --   --   --   --   --   --   ALT 14  --  19  --   --   --   --   --   --  CBC:  Recent Labs Lab 10/13/14 0220 10/13/14 0956 10/15/14 0314 10/15/14 1630 10/16/14 0217  WBC 8.4 8.7 6.0 6.4 6.5  HGB 7.2* 7.9* 6.3* 9.1* 8.4*  HCT 22.8* 24.5* 18.8* 27.4* 25.4*  MCV 93.4 92.8 88.3 86.7 87.9  PLT 74* 54* 35* 37* 38*   Cardiac Enzymes:  Recent Labs Lab 10/13/14 0220 10/13/14 1130 10/13/14 1600 10/13/14 2154 10/14/14 0358  TROPONINI 0.09* 0.10* 0.12* 0.14* 0.15*   Urinalysis COLORURINE YELLOW  APPEARANCEUR CLOUDY*  LABSPEC 1.017  PHURINE 5.0  GLUCOSEU NEGATIVE  GLUCOSEU NEG mg/dL  HGBUR LARGE*  BILIRUBINUR NEGATIVE  KETONESUR NEGATIVE  PROTEINUR >300*  UROBILINOGEN 0.2  NITRITE NEGATIVE  LEUKOCYTESUR NEGATIVE   Studies/Results: Dg Chest 2 View  10/14/2014   CLINICAL DATA:  Dyspnea  EXAM: CHEST  2 VIEW  COMPARISON:  10/13/2014  FINDINGS: Mild progression of bibasilar airspace disease, possible atelectasis or pneumonia. Progression of bibasilar effusion. Mild vascular congestion without pulmonary edema.  Central venous catheter tip in the SVC without pneumothorax. Cardiac enlargement  IMPRESSION: Progression of bibasilar airspace disease and bilateral pleural effusions suggesting fluid overload. Negative for edema.   Electronically Signed   By: Franchot Gallo M.D.   On: 10/14/2014 08:10   ECG 5/9: Sinus tachycardia (ventricular rate 116) with rightward axis, no conduction delays, QTc 442mec, globally low voltage without peaked T waves. No ST segment changes apparent though there is significant baseline wander.   Ill 59yo female with history of SLE, CKD 4, combined CHF, and alcohol abuse here with acute worsening of significant and progressive CKD, in the setting of lactic/mixed acidosis and failure to thrive since recent hospitalization for HCAP.   Impressions/Recommendations: Acute anuric renal failure on stage IV CKD: With sub-nephrotic range proteinuria (UPC 2.03). Caused by, and in turn possibly contributing to, failure to thrive in setting of significantly scarred kidneys on previous biopsy.  - Trialysis catheter placed 5/10: HD 5/12, 5/13, 5/14; reassess 5/16; CLIP in progress - ok to D/C foley - Hyperkalemia: resolved.  - Secondary HPTH/metabolic bone disease: PTH 150. Vit D 7.2: continue home calcitriol - Anemia of chronic disease: (hgb baseline ~8.5): Started ESA, T sat % was high in January. Hgb dropped < 7 on 5/11 (?dilutional) and rebounded to 9.1 s/p 1u PRBCs; 8.4 today  - Strict I/O, daily weight, avoid nephrotoxins, narrow from vancomycin when able  Thrombocytopenia:  -Hold heparin at HD.   Lactic acidosis:  - Corrected with HD, now on home dose bicarbonate.   SLE flare: Known lupus with strong positive ANA titer (1:320, homogenous). Flare suggested by hypocomplementemia, highly elevated dsDNA and developing leukopenia/pancytopenia. ESR noted and nonspecific.  - Low dose steroids started per primary yesterday after conferring with Dr. TCharlestine Night outpatient rheumatologist (though she was never actually seen there). She is having pain, albeit atypical of a flare. Hopefully this helps. With degree of renal scarring on renal Bx, not sure this will improve renal function. Will expect small increase in azotemia corrected with HD.    Acute hypocemic respiratory failure with transudative right-side pleural effusion: requiring 5L O2 (4L home requirement since recent admission [4/14 - 4/20] for HCAP). Stopped abx 5/11. Oxygen requirement improving.    Ryan B. GBonner Puna MD, PGY-2 10/16/2014 8:00 AM   Patient seen and examined, agree with above note with above modifications. Feels better after HD- on for second treatment today.  Also got one unit PRBC - acidosis resolved- attempting to remove volume- O2 req decreasing.  Planning third HD tomorrow and  fourth on Monday- started CLIP process for OP unit- will need AVF/ PC next week- I think too frail at this time to get it Corliss Parish, MD 10/16/2014

## 2014-10-16 NOTE — Progress Notes (Signed)
Occupational Therapy Treatment Patient Details Name: [name] [name] MRN: [id number] DOB: 14-Dec-1955 Today's Date: 10/16/2014    History of present illness Pt admitted with dyspnea and inability to take medications due to vomiting.  Pt also reports diarrhea. Now s/p thoracentesis and placement of central venous HD catheter. Pt discharged last week with HCAP. PMH: CKD, CHF, Lupus, pericardial window, ETOH window, HTN, depression.   OT comments  Pt with improved mobility and ability to ambulate.  Continues to demonstrate decreased activity tolerance for ADL.    Follow Up Recommendations  SNF    Equipment Recommendations       Recommendations for Other Services      Precautions / Restrictions Precautions Precautions: Fall Restrictions Weight Bearing Restrictions: No       Mobility Bed Mobility Overal bed mobility: Needs Assistance Bed Mobility: Sit to Supine       Sit to supine: Supervision      Transfers Overall transfer level: Needs assistance Equipment used: Rolling walker (2 wheeled) Transfers: Sit to/from Stand Sit to Stand: Min assist;+2 safety/equipment         General transfer comment: verbal cues for hand placement    Balance Overall balance assessment: Needs assistance Sitting-balance support: Feet supported Sitting balance-Leahy Scale: Fair       Standing balance-Leahy Scale: Poor                     ADL Overall ADL's : Needs assistance/impaired                         Toilet Transfer: Minimal assistance;Ambulation             General ADL Comments: Pt with poor activity tolerance.      Vision                     Perception     Praxis      Cognition   Behavior During Therapy: WFL for tasks assessed/performed Overall Cognitive Status: Within Functional Limits for tasks assessed                       Extremity/Trunk Assessment               Exercises     Shoulder Instructions        General Comments      Pertinent Vitals/ Pain       Pain Assessment: Faces Faces Pain Scale: Hurts little more Pain Location: back and legs Pain Descriptors / Indicators: Other (Comment) (reports) Pain Intervention(s): Monitored during session  [place] expects to be discharged to:: Private residence                                        Prior Functioning/Environment              Frequency Min 2X/week     Progress Toward Goals  OT Goals(current goals can now be found in the care plan section)  Progress towards OT goals: Progressing toward goals  Acute Rehab OT Goals Patient Stated Goal: get stronger before going home Time For Goal Achievement: 10/28/14  Plan Discharge plan remains appropriate    Co-evaluation    PT/OT/SLP Co-Evaluation/Treatment: Yes Reason for Co-Treatment: Complexity of the patient's impairments (multi-system involvement)   OT goals addressed during session: ADL's and self-care  End of Session Equipment Utilized During Treatment: Rolling walker;Oxygen (3L)   Activity Tolerance Patient tolerated treatment well   Patient Left in bed;with call bell/phone within reach   Nurse Communication Mobility status        Time: 1457-1520 OT Time Calculation (min): 23 min  Charges: OT General Charges $OT Visit: 1 Procedure OT Treatments $Self Care/Home Management : 8-22 mins  Malka So 10/16/2014, 3:58 PM  [phone]

## 2014-10-16 NOTE — Consult Note (Signed)
   [facility] CM Inpatient Consult   10/16/2014  [name] [name] 20-Apr-1956 [id number] Patient was evaluated for [place] Management services and as a benefit of her [place] [employer] Care/Medicare.  Came by the speak with the patient at bedside but she was working with two therapy team members.  Will follow up if needs arises.  Also, spoke with inpatient RNCM regarding the patient eligibility for services.  Of note, [facility] Care Management does not replace or interfere with any services arranged by the inpatient care management team.  For questions or referrals, please contact: Natividad Brood, RN BSN [facility] Liaison  [phone] business mobile phone

## 2014-10-16 NOTE — Progress Notes (Addendum)
Subjective:    The patient reports that dialysis went well yesterday afternoon. She reports improvement of her shortness of breath and energy. However, she continues to have some orthopnea. She also reports improvement of her finger and toe pain with no complaints of pain currently.  Interval Events: -First dialysis yesterday and yesterday with 1 L. -Receiving dialysis this morning.  -Nephrology as started the CLIP process.   Objective:    Vital Signs:   Temp:  [98 F (36.7 C)-98.9 F (37.2 C)] 98.9 F (37.2 C) (05/13 0400) Pulse Rate:  [91-105] 94 (05/13 0600) Resp:  [13-16] 16 (05/13 0600) BP: (102-127)/(74-90) 125/82 mmHg (05/13 0600) SpO2:  [87 %-100 %] 100 % (05/13 0600) Weight:  [99 lb 3.3 oz (45 kg)-101 lb 13.6 oz (46.2 kg)] 101 lb 13.6 oz (46.2 kg) (05/13 0400) Last BM Date: 10/15/14  24-hour weight change: Weight change: 1 lb 15.7 oz (0.9 kg)  Intake/Output:   Intake/Output Summary (Last 24 hours) at 10/16/14 0804 Last data filed at 10/16/14 0437  Gross per 24 hour  Intake    930 ml  Output   1375 ml  Net   -445 ml      Physical Exam: General: Thin, cachectic appearing, alert, in no acute distress.  Lungs:  Bilateral basilar rales, minimal improvement.  Heart: Regular rate and rhythm, S1 and S2 normal without gallop, murmur, or rubs.  Abdomen:  BS normoactive, nontender, nondistended.  Extremities: No pretibial edema, no tenderness to palpation.     Labs:  Basic Metabolic Panel:  Recent Labs Lab 10/13/14 0430 10/13/14 0956 10/13/14 1600 10/14/14 0358 10/15/14 0314 10/16/14 0217  NA  --  137 138 140 138 135  K  --  6.1* 4.4 3.5 3.0* 3.8  CL  --  111 111 107 104 102  CO2  --  6* 10* 19* 23 25  GLUCOSE  --  94 145* 161* 97 172*  BUN  --  24* 27* 24* 24* 10  CREATININE  --  5.15* 5.23* 4.94* 4.79* 2.79*  CALCIUM  --  6.3* 6.2* 6.0* 6.3* 6.7*  MG  --  1.1* 1.0* 1.2* 2.1 1.8  PHOS 9.0*  --   --   --  4.7* 2.6    Liver Function  Tests:  Recent Labs Lab 10/12/14 1932 10/13/14 0220 10/15/14 0314 10/16/14 0217  AST 74* 94*  --   --   ALT 14 19  --   --   ALKPHOS 436* 379*  --   --   BILITOT 0.7 0.6  --   --   PROT 8.0 7.3  --   --   ALBUMIN 1.3* 1.2* 1.0* 1.1*    Recent Labs Lab 10/12/14 1932  LIPASE 13*   CBC:  Recent Labs Lab 10/13/14 0220 10/13/14 0956 10/15/14 0314 10/15/14 1630 10/16/14 0217  WBC 8.4 8.7 6.0 6.4 6.5  HGB 7.2* 7.9* 6.3* 9.1* 8.4*  HCT 22.8* 24.5* 18.8* 27.4* 25.4*  MCV 93.4 92.8 88.3 86.7 87.9  PLT 74* 54* 35* 37* 38*    Cardiac Enzymes:  Recent Labs Lab 10/13/14 0220 10/13/14 1130 10/13/14 1600 10/13/14 2154 10/14/14 0358  TROPONINI 0.09* 0.10* 0.12* 0.14* 0.15*   Microbiology: Results for orders placed or performed during the hospital encounter of 10/12/14  Culture, blood (routine x 2)     Status: None (Preliminary result)   Collection Time: 10/12/14 10:04 PM  Result Value Ref Range Status   Specimen Description BLOOD LEFT ANTECUBITAL  Final  Special Requests BOTTLES DRAWN AEROBIC AND ANAEROBIC 5CC EA  Final   Culture   Final           BLOOD CULTURE RECEIVED NO GROWTH TO DATE CULTURE WILL BE HELD FOR 5 DAYS BEFORE ISSUING A FINAL NEGATIVE REPORT Performed at [facility]    Report Status PENDING  Incomplete  Culture, blood (routine x 2)     Status: None (Preliminary result)   Collection Time: 10/12/14 10:16 PM  Result Value Ref Range Status   Specimen Description BLOOD LEFT WRIST  Final   Special Requests BOTTLES DRAWN AEROBIC ONLY 3CC  Final   Culture   Final           BLOOD CULTURE RECEIVED NO GROWTH TO DATE CULTURE WILL BE HELD FOR 5 DAYS BEFORE ISSUING A FINAL NEGATIVE REPORT Note: Culture results may be compromised due to an inadequate volume of blood received in culture bottles. Performed at [facility]    Report Status PENDING  Incomplete  MRSA PCR Screening     Status: None   Collection Time: 10/12/14 11:40 PM  Result Value  Ref Range Status   MRSA by PCR NEGATIVE NEGATIVE Final    Comment:        The GeneXpert MRSA Assay (FDA approved for NASAL specimens only), is one component of a comprehensive MRSA colonization surveillance program. It is not intended to diagnose MRSA infection nor to guide or monitor treatment for MRSA infections.   Urine culture     Status: None   Collection Time: 10/13/14  4:12 AM  Result Value Ref Range Status   Specimen Description URINE, RANDOM  Final   Special Requests NONE  Final   Colony Count   Final    >=100,000 COLONIES/ML Performed at [facility]    Culture   Final    Multiple bacterial morphotypes present, none predominant. Suggest appropriate recollection if clinically indicated. Performed at [facility]    Report Status 10/14/2014 FINAL  Final  Body fluid culture     Status: None (Preliminary result)   Collection Time: 10/13/14  1:18 PM  Result Value Ref Range Status   Specimen Description FLUID RIGHT PLEURAL  Final   Special Requests NONE  Final   Gram Stain   Final    RARE WBC PRESENT, PREDOMINANTLY MONONUCLEAR NO ORGANISMS SEEN Performed at [facility]    Culture   Final    NO GROWTH 2 DAYS Performed at [facility]    Report Status PENDING  Incomplete   Hepatitis B surface antibody, surface antigen, core antibody: Negative.  Imaging: No results found.     Medications:    Infusions: . sodium chloride 10 mL/hr at 10/13/14 0131    Scheduled Medications: . antiseptic oral rinse  7 mL Mouth Rinse BID  . calcitRIOL  0.25 mcg Oral Daily  . calcium carbonate  6 tablet Oral TID WC  . darbepoetin (ARANESP) injection - NON-DIALYSIS  100 mcg Subcutaneous Q Tue-1800  . FLUoxetine  10 mg Oral Daily  . folic acid  1 mg Oral Daily  . magnesium chloride  2 tablet Oral Daily  . ondansetron  4 mg Intravenous Once  . predniSONE  5 mg Oral Q breakfast  . sodium bicarbonate  1,300 mg Oral TID  . thiamine  100 mg Oral  Daily  . vitamin B-12  250 mcg Oral QPM    PRN Medications: sodium chloride, sodium chloride, camphor-menthol, feeding supplement (NEPRO CARB  STEADY), heparin, lidocaine (PF), lidocaine-prilocaine, loperamide, LORazepam, oxyCODONE, pentafluoroprop-tetrafluoroeth   Assessment/ Plan:    Active Problems:   Alcohol abuse   FSGS (focal segmental glomerulosclerosis)   Anemia of chronic disease   Lupus (systemic lupus erythematosus)   Elevated troponin   Electrolyte abnormality   Metabolic acidosis   Acute renal failure syndrome  #Volume overload secondary to acute on chronic kidney disease The patient tolerated dialysis well yesterday. She continues have some orthopnea, but her shortness of breath and energy is improved. Discussed with Dr. Moshe Cipro who started the CLIP process. Current plan is discharge to SNF. -Second dialysis session today. -Continue CLIP process. -Patient will need hepatitis B vaccine. -Discontinue Foley.  -Follow-up blood, urine, and body fluid cultures. -Wean oxygen today. Keep O2 sats greater than 92%. -Transfer to telemetry today. -Discontinue home Lasix.  #SLE Pain improved after starting steroids yesterday, question whether dialysis may have helped as well. She does meet the diagnosis criteria for SLE, and she has not started therapy due to not following up with rheumatology. I think it will be difficult for her to get to a rheumatology appointment, so we will start hydroxychloroquine therapy here. -Continue prednisone 5 mg daily. -Start hydroxychloroquine 400 mg daily. -Patient will need an eye exam as an outpatient. -Continue oxycodone 5 mg every 4 hours when necessary.  #Anion gap metabolic acidosis with concurrent non-gap acidosis Resolved. -Continue sodium bicarbonate 1300 mg 3 times a day.  #Electrolyte abnormalities Significantly improved today after dialysis. -Continue to monitor magnesium and BMP.  #CKD related metabolic bone  disease Calcium stable. -Continue home calcium carbonate 1200 mg 3 times a day. -Continue home calcitriol 0.25 g daily.  #Anemia of chronic disease Hemoglobin improved after transfusion yesterday. No signs of active bleeding. -Continue Aranesp per nephrology. -Continue to monitor hemoglobin.  #Prolonged QTc Hopefully this will improve with dialysis. -Avoid QT prolonging medications.  #Hypertension Blood pressure remained slightly low. She likely needs fewer blood pressure medications on dialysis. -Continue to hold home amlodipine and metoprolol.  #Alcohol abuse -CIWA protocol.  #Depression -Continue home fluoxetine 10 mg daily.   DVT PPX - SCD's while in bed  CODE STATUS - Full.  CONSULTS PLACED - nephrology.  DISPO - Disposition is deferred at this time, awaiting improvement of dyspnea.   Anticipated discharge in approximately 3-7 day(s).   The patient does have a current PCP Eulas Post, Brett Canales, MD) and does need an [facility] hospital follow-up appointment after discharge.    Is the [facility] hospital follow-up appointment a one-time only appointment? no.  Does the patient have transportation limitations that hinder transportation to clinic appointments? yes   SERVICE NEEDED AT [place]         Y = Yes, Blank = No PT: SNF  OT: SNF  RN:   Equipment:   Other:      Length of Stay: 4 day(s)   Signed: Charlesetta Shanks, MD  PGY-1, Internal Medicine Resident Pager: [phone] (7AM-5PM) 10/16/2014, 8:04 AM

## 2014-10-16 NOTE — Clinical Social Work Placement (Signed)
   CLINICAL SOCIAL WORK PLACEMENT  NOTE  Date:  10/16/2014  Patient Details  Name: [name] [name] MRN: [id number] Date of Birth: [birth year]  Clinical Social Work is seeking post-discharge placement for this patient at the [place] level of care (*CSW will initial, date and re-position this form in  chart as items are completed):  Yes   Patient/family provided with [facility] Work Department's list of facilities offering this level of care within the geographic area requested by the patient (or if unable, by the patient's family).  Yes   Patient/family informed of their freedom to choose among providers that offer the needed level of care, that participate in Medicare, Medicaid or managed care program needed by the patient, have an available bed and are willing to accept the patient.  Yes   Patient/family informed of [facility]'s ownership interest in [facility] and [facility], as well as of the fact that they are under no obligation to receive care at these facilities.  PASRR submitted to EDS on 10/15/14     PASRR number received on       Existing PASRR number confirmed on       FL2 transmitted to all facilities in geographic area requested by pt/family on 10/16/14     FL2 transmitted to all facilities within larger geographic area on       Patient informed that his/her managed care company has contracts with or will negotiate with certain facilities, including the following:            Patient/family informed of bed offers received.  Patient chooses bed at       Physician recommends and patient chooses bed at      Patient to be transferred to   on  .  Patient to be transferred to facility by       Patient family notified on   of transfer.  Name of family member notified:        PHYSICIAN       Additional Comment:    _______________________________________________ Cranford Mon, LCSW 10/16/2014, 1:32 PM

## 2014-10-16 NOTE — Progress Notes (Signed)
Physical Therapy Treatment Patient Details Name: [name] [name] MRN: [id number] DOB: February 19, 1956 Today's Date: 10/16/2014    History of Present Illness Pt admitted with dyspnea and inability to take medications due to vomiting.  Pt also reports diarrhea. Now s/p thoracentesis and placement of central venous HD catheter. Pt discharged last week with HCAP. PMH: CKD, CHF, Lupus, pericardial window, ETOH window, HTN, depression.    PT Comments    Pt admitted with above diagnosis. Pt currently with functional limitations due to the deficits listed below (see PT Problem List). Pt able to ambulate with RW increasing distance today.  Pt with decr endurance and strength however is progressing.  Plan for NH prior to going home to incr strength so that pt can perform as she did prior to admission.  Pt will benefit from skilled PT to increase their independence and safety with mobility to allow discharge to the venue listed below.    Follow Up Recommendations  SNF;Supervision/Assistance - 24 hour     Equipment Recommendations  None recommended by PT    Recommendations for Other Services       Precautions / Restrictions Precautions Precautions: Fall Restrictions Weight Bearing Restrictions: No    Mobility  Bed Mobility Overal bed mobility: Needs Assistance Bed Mobility: Sit to Supine       Sit to supine: Supervision      Transfers Overall transfer level: Needs assistance Equipment used: Rolling walker (2 wheeled) Transfers: Sit to/from Stand Sit to Stand: Min assist;+2 safety/equipment         General transfer comment: verbal cues for hand placement  Ambulation/Gait Ambulation/Gait assistance: Min guard Ambulation Distance (Feet): 110 Feet Assistive device: Rolling walker (2 wheeled) Gait Pattern/deviations: Step-through pattern;Decreased step length - right;Decreased step length - left   Gait velocity interpretation: Below normal speed for age/gender General Gait  Details: No loss of balance with RW.  Pt still weak and cannot withstand challenges to balance.  Used cane PTA therefore is not at baseline.     Stairs            Wheelchair Mobility    Modified Rankin (Stroke Patients Only)       Balance Overall balance assessment: Needs assistance Sitting-balance support: Feet supported;No upper extremity supported Sitting balance-Leahy Scale: Fair     Standing balance support: Bilateral upper extremity supported;During functional activity Standing balance-Leahy Scale: Poor Standing balance comment: requires UE support for static stance on RW.                     Cognition Arousal/Alertness: Awake/alert Behavior During Therapy: WFL for tasks assessed/performed Overall Cognitive Status: Within Functional Limits for tasks assessed                      Exercises      General Comments        Pertinent Vitals/Pain Pain Assessment: Faces Faces Pain Scale: Hurts little more Pain Location: back and legs Pain Descriptors / Indicators: Aching Pain Intervention(s): Monitored during session;Limited activity within patient's tolerance;Repositioned  VSS with sats >90% on 4LO2.      Home Living Family/patient expects to be discharged to:: Private residence                    Prior Function            PT Goals (current goals can now be found in the care plan section) Acute Rehab PT Goals Patient Stated Goal: get stronger  before going home Progress towards PT goals: Progressing toward goals    Frequency  Min 3X/week    PT Plan Current plan remains appropriate    Co-evaluation PT/OT/SLP Co-Evaluation/Treatment: Yes Reason for Co-Treatment: Complexity of the patient's impairments (multi-system involvement) PT goals addressed during session: Mobility/safety with mobility OT goals addressed during session: ADL's and self-care     End of Session Equipment Utilized During Treatment: Gait  belt;Oxygen Activity Tolerance: Patient limited by fatigue Patient left: in bed;with call bell/phone within reach;with bed alarm set     Time: 1457-1520 PT Time Calculation (min) (ACUTE ONLY): 23 min  Charges:  $Gait Training: 8-22 mins                    G CodesDenice Paradise 2014-10-19, 4:53 PM [employer] Acute Rehabilitation [phone] (pager)

## 2014-10-16 NOTE — Progress Notes (Signed)
  Date: 10/16/2014  Patient name: [name] [name]  Medical record number: [id number]  Date of birth: Dec 14, 1955   This patient has been seen and the plan of care was discussed with the house staff. Please see their note for complete details. I concur with their findings with the following additions/corrections: [name] is feeling better. Hand pain and foot pain gone after Dr Trudee Kuster started prednisone although her ankle hurts occ. She currently is on her second HD session. Now on hydroxychloroquine for SLE. Needs Rheum outpt F/U. Possible D/C early next week.   Bartholomew Crews, MD 10/16/2014, 11:53 AM

## 2014-10-17 DIAGNOSIS — D638 Anemia in other chronic diseases classified elsewhere: Secondary | ICD-10-CM | POA: Diagnosis not present

## 2014-10-17 DIAGNOSIS — E781 Pure hyperglyceridemia: Secondary | ICD-10-CM | POA: Diagnosis not present

## 2014-10-17 DIAGNOSIS — R34 Anuria and oliguria: Secondary | ICD-10-CM | POA: Diagnosis not present

## 2014-10-17 DIAGNOSIS — N269 Renal sclerosis, unspecified: Secondary | ICD-10-CM | POA: Diagnosis not present

## 2014-10-17 DIAGNOSIS — Z681 Body mass index (BMI) 19 or less, adult: Secondary | ICD-10-CM | POA: Diagnosis not present

## 2014-10-17 DIAGNOSIS — G8929 Other chronic pain: Secondary | ICD-10-CM | POA: Diagnosis not present

## 2014-10-17 DIAGNOSIS — N179 Acute kidney failure, unspecified: Secondary | ICD-10-CM | POA: Diagnosis not present

## 2014-10-17 DIAGNOSIS — E43 Unspecified severe protein-calorie malnutrition: Secondary | ICD-10-CM | POA: Diagnosis not present

## 2014-10-17 DIAGNOSIS — E538 Deficiency of other specified B group vitamins: Secondary | ICD-10-CM | POA: Diagnosis not present

## 2014-10-17 DIAGNOSIS — F329 Major depressive disorder, single episode, unspecified: Secondary | ICD-10-CM | POA: Diagnosis not present

## 2014-10-17 DIAGNOSIS — J189 Pneumonia, unspecified organism: Secondary | ICD-10-CM | POA: Diagnosis not present

## 2014-10-17 DIAGNOSIS — E875 Hyperkalemia: Secondary | ICD-10-CM | POA: Diagnosis not present

## 2014-10-17 DIAGNOSIS — R627 Adult failure to thrive: Secondary | ICD-10-CM | POA: Diagnosis not present

## 2014-10-17 DIAGNOSIS — J9621 Acute and chronic respiratory failure with hypoxia: Secondary | ICD-10-CM | POA: Diagnosis not present

## 2014-10-17 DIAGNOSIS — I120 Hypertensive chronic kidney disease with stage 5 chronic kidney disease or end stage renal disease: Secondary | ICD-10-CM | POA: Diagnosis not present

## 2014-10-17 DIAGNOSIS — I5042 Chronic combined systolic (congestive) and diastolic (congestive) heart failure: Secondary | ICD-10-CM | POA: Diagnosis not present

## 2014-10-17 DIAGNOSIS — E872 Acidosis: Secondary | ICD-10-CM | POA: Diagnosis not present

## 2014-10-17 DIAGNOSIS — M329 Systemic lupus erythematosus, unspecified: Secondary | ICD-10-CM | POA: Diagnosis not present

## 2014-10-17 DIAGNOSIS — D6959 Other secondary thrombocytopenia: Secondary | ICD-10-CM | POA: Diagnosis not present

## 2014-10-17 DIAGNOSIS — R64 Cachexia: Secondary | ICD-10-CM | POA: Diagnosis not present

## 2014-10-17 DIAGNOSIS — N186 End stage renal disease: Secondary | ICD-10-CM | POA: Diagnosis not present

## 2014-10-17 DIAGNOSIS — K219 Gastro-esophageal reflux disease without esophagitis: Secondary | ICD-10-CM | POA: Diagnosis not present

## 2014-10-17 DIAGNOSIS — N2581 Secondary hyperparathyroidism of renal origin: Secondary | ICD-10-CM | POA: Diagnosis not present

## 2014-10-17 LAB — CBC
HCT: 26.5 % — ABNORMAL LOW (ref 36.0–46.0)
HEMATOCRIT: 25 % — AB (ref 36.0–46.0)
HEMOGLOBIN: 8 g/dL — AB (ref 12.0–15.0)
Hemoglobin: 8.7 g/dL — ABNORMAL LOW (ref 12.0–15.0)
MCH: 29.6 pg (ref 26.0–34.0)
MCH: 29 pg (ref 26.0–34.0)
MCHC: 32.8 g/dL (ref 30.0–36.0)
MCHC: 32 g/dL (ref 30.0–36.0)
MCV: 90.1 fL (ref 78.0–100.0)
MCV: 90.6 fL (ref 78.0–100.0)
PLATELETS: 42 10*3/uL — AB (ref 150–400)
Platelets: 37 10*3/uL — ABNORMAL LOW (ref 150–400)
RBC: 2.94 MIL/uL — AB (ref 3.87–5.11)
RBC: 2.76 MIL/uL — ABNORMAL LOW (ref 3.87–5.11)
RDW: 18.9 % — ABNORMAL HIGH (ref 11.5–15.5)
RDW: 18.9 % — ABNORMAL HIGH (ref 11.5–15.5)
WBC: 6.8 10*3/uL (ref 4.0–10.5)
WBC: 6.6 10*3/uL (ref 4.0–10.5)

## 2014-10-17 LAB — RENAL FUNCTION PANEL
ALBUMIN: 1.1 g/dL — AB (ref 3.5–5.0)
ALBUMIN: 1 g/dL — AB (ref 3.5–5.0)
ANION GAP: 4 — AB (ref 5–15)
Anion gap: 8 (ref 5–15)
BUN: <5 mg/dL — ABNORMAL LOW (ref 6–20)
BUN: <5 mg/dL — ABNORMAL LOW (ref 6–20)
CHLORIDE: 101 mmol/L (ref 101–111)
CO2: 26 mmol/L (ref 22–32)
CO2: 29 mmol/L (ref 22–32)
CREATININE: 1.75 mg/dL — AB (ref 0.44–1.00)
CREATININE: 1.87 mg/dL — AB (ref 0.44–1.00)
Calcium: 7.1 mg/dL — ABNORMAL LOW (ref 8.9–10.3)
Calcium: 7.1 mg/dL — ABNORMAL LOW (ref 8.9–10.3)
Chloride: 101 mmol/L (ref 101–111)
GFR calc Af Amer: 36 mL/min — ABNORMAL LOW (ref >60)
GFR calc Af Amer: 33 mL/min — ABNORMAL LOW (ref >60)
GFR calc non Af Amer: 31 mL/min — ABNORMAL LOW (ref >60)
GFR calc non Af Amer: 29 mL/min — ABNORMAL LOW (ref >60)
GLUCOSE: 188 mg/dL — AB (ref 65–99)
Glucose, Bld: 115 mg/dL — ABNORMAL HIGH (ref 65–99)
PHOSPHORUS: 1.7 mg/dL — AB (ref 2.5–4.6)
Phosphorus: 1.6 mg/dL — ABNORMAL LOW (ref 2.5–4.6)
Potassium: 3.8 mmol/L (ref 3.5–5.1)
Potassium: 3.6 mmol/L (ref 3.5–5.1)
SODIUM: 135 mmol/L (ref 135–145)
Sodium: 134 mmol/L — ABNORMAL LOW (ref 135–145)

## 2014-10-17 LAB — MAGNESIUM: MAGNESIUM: 1.7 mg/dL (ref 1.7–2.4)

## 2014-10-17 LAB — BODY FLUID CULTURE: Culture: NO GROWTH

## 2014-10-17 MED ORDER — HEPATITIS B VAC RECOMBINANT 10 MCG/ML IJ SUSP
1.0000 mL | Freq: Once | INTRAMUSCULAR | Status: AC
  Administered 2014-10-17: 10 ug via INTRAMUSCULAR
  Filled 2014-10-17: qty 1

## 2014-10-17 MED ORDER — SODIUM PHOSPHATE 3 MMOLE/ML IV SOLN
30.0000 mmol | Freq: Once | INTRAVENOUS | Status: AC
  Administered 2014-10-17: 30 mmol via INTRAVENOUS
  Filled 2014-10-17 (×2): qty 10

## 2014-10-17 NOTE — Progress Notes (Signed)
Admission note:  Arrival Method: bed Mental Orientation: alert & oriented x 4 Telemetry: box applied and CCMD notified  Assessment: completed  Skin: dry  IV: right triple lumen IJ Pain: pt denies  Tubes: N/A Safety Measures: Patient Handbook has been given, and discussed the Fall Prevention worksheet. Left at bedside  Admission: Completed and admission orders have been written  6E Orientation: Patient has been oriented to the unit, staff and to the room.

## 2014-10-17 NOTE — Progress Notes (Signed)
S: Breathing easier, gaining strength. Oxygen requirement down. Pain improved. UOP still quite poor. Also has appetite  . antiseptic oral rinse  7 mL Mouth Rinse BID  . calcitRIOL  0.25 mcg Oral Daily  . calcium carbonate  6 tablet Oral TID WC  . darbepoetin (ARANESP) injection - NON-DIALYSIS  100 mcg Subcutaneous Q Tue-1800  . FLUoxetine  10 mg Oral Daily  . folic acid  1 mg Oral Daily  . hydroxychloroquine  400 mg Oral Daily  . magnesium chloride  2 tablet Oral Daily  . ondansetron  4 mg Intravenous Once  . predniSONE  5 mg Oral Q breakfast  . sodium bicarbonate  1,300 mg Oral TID  . thiamine  100 mg Oral Daily  . vitamin B-12  250 mcg Oral QPM   O:Temp:  [97 F (36.1 C)-98.5 F (36.9 C)] 98.2 F (36.8 C) (05/14 0800) Pulse Rate:  [88-102] 88 (05/14 0800) Resp:  [10-23] 16 (05/14 0800) BP: (109-135)/(77-97) 135/97 mmHg (05/14 0800) SpO2:  [97 %-100 %] 100 % (05/14 0800) Weight:  [102 lb 4.7 oz (46.4 kg)-107 lb 12.9 oz (48.9 kg)] 107 lb 12.9 oz (48.9 kg) (05/14 0400)  Intake/Output Summary (Last 24 hours) at 10/16/14 0800 Last data filed at 10/16/14 0437  Gross per 24 hour  Intake    930 ml  Output   1375 ml; 1107m UOP  Net   -445 ml   Intake/Output: I/O last 3 completed shifts: In: 710 [P.O.:710] Out: 1350 [Urine:350; Other:1000]  Intake/Output this shift: Pre/post HD change: 1 kg (5/14) [1 L off] + 2 kg overnight   Gen: Thin, pleasant 59y.o.female in no distress receiving HD CV: Regular rate, no murmur, gallop or rub, normal JVP Chest: Non-labored, 100% on 3L. Good air movement, crackles resolving, heard further toward bases GI: +BS, soft, non-tender, non-distended Ext: 1+ radial pulses, palpable DP pulses bilaterally. 2+ dependent pitting edema. Skin: No rashes or bruising Neuro: Alert and oriented. No asterixis.  Access: R IJ central dialysis cath (5/10)   10/12/2014  10/13/2014 02:20 10/13/2014 09:55 10/13/2014 19:55  Lactic Acid 11.54 (HH) 10.3 (HH) 9.3 (HH) 8.0  (HH)    Recent Labs Lab 10/12/14 1932  10/13/14 0220 10/13/14 0430 10/13/14 0956 10/13/14 1600 10/14/14 0358 10/15/14 0314 10/16/14 0217 10/17/14 0319  NA  --   < > 134*  --  137 138 140 138 135 135  K  --   < > 5.3*  --  6.1* 4.4 3.5 3.0* 3.8 3.8  CL  --   < > 110  --  111 111 107 104 102 101  CO2  --   < > <5*  --  6* 10* 19* 23 25 26   GLUCOSE  --   < > 148*  --  94 145* 161* 97 172* 115*  BUN  --   < > 23*  --  24* 27* 24* 24* 10 <5*  CREATININE  --   < > 5.26*  --  5.15* 5.23* 4.94* 4.79* 2.79* 1.75*  ALBUMIN 1.3*  --  1.2*  --   --   --   --  1.0* 1.1* 1.1*  CALCIUM  --   < > 6.2*  --  6.3* 6.2* 6.0* 6.3* 6.7* 7.1*  PHOS  --   --   --  9.0*  --   --   --  4.7* 2.6 1.6*  AST 74*  --  94*  --   --   --   --   --   --   --  ALT 14  --  19  --   --   --   --   --   --   --   < > = values in this interval not displayed. CBC:  Recent Labs Lab 10/13/14 0956 10/15/14 0314 10/15/14 1630 10/16/14 0217 10/17/14 0319  WBC 8.7 6.0 6.4 6.5 6.8  HGB 7.9* 6.3* 9.1* 8.4* 8.7*  HCT 24.5* 18.8* 27.4* 25.4* 26.5*  MCV 92.8 88.3 86.7 87.9 90.1  PLT 54* 35* 37* 38* 42*   Cardiac Enzymes:  Recent Labs Lab 10/13/14 0220 10/13/14 1130 10/13/14 1600 10/13/14 2154 10/14/14 0358  TROPONINI 0.09* 0.10* 0.12* 0.14* 0.15*   Urinalysis COLORURINE YELLOW  APPEARANCEUR CLOUDY*  LABSPEC 1.017  PHURINE 5.0  GLUCOSEU NEGATIVE  GLUCOSEU NEG mg/dL  HGBUR LARGE*  BILIRUBINUR NEGATIVE  KETONESUR NEGATIVE  PROTEINUR >300*  UROBILINOGEN 0.2  NITRITE NEGATIVE  LEUKOCYTESUR NEGATIVE   Studies/Results: No results found. ECG 5/9: Sinus tachycardia (ventricular rate 116) with rightward axis, no conduction delays, QTc 470mec, globally low voltage without peaked T waves. No ST segment changes apparent though there is significant baseline wander.   Ill 59yo female with history of SLE, CKD 4, combined CHF, and alcohol abuse here with acute worsening of significant and progressive CKD, in  the setting of lactic/mixed acidosis and failure to thrive since recent hospitalization for HCAP.   Impressions/Recommendations: Acute anuric renal failure on stage IV CKD: With sub-nephrotic range proteinuria (UPC 2.03). Caused by, and in turn possibly contributing to, failure to thrive in setting of significantly scarred kidneys on previous biopsy.  - Trialysis catheter placed 5/10: HD 5/12, 5/13, 5/14; reassess 5/16; CLIP in progress - Will pursue PC/AVF placement next week when she gains some strength  - Hyperkalemia: resolved.  - Secondary HPTH/metabolic bone disease: PTH 150. Vit D 7.2: continue home calcitriol - Hypophosphatemia: Secondary to FTT/malnourishment; may be contributing to frailty. Will replete today. Put on regular diet - Anemia of chronic disease: (hgb baseline ~8.5): Started ESA, T sat % was high in January. Hgb stable s/p 1uPRBCs 5/12 - Strict I/O, daily weight, avoid nephrotoxins - Will change to less restrictive diet given malnourishment.   Thrombocytopenia: Stable. No bleeding/thrombosis.  -Hold heparin at HD   Lactic acidosis:  - Corrected, continue home dose bicarbonate.   SLE flare: Known lupus with strong positive ANA titer (1:320, homogenous). Flare suggested by hypocomplementemia, highly elevated dsDNA and developing leukopenia/pancytopenia. ESR noted and nonspecific.  - Low dose steroids started per primary seems to have helped with pain. Hydroxychloroquine also added. With degree of renal scarring on renal Bx, not sure this will improve renal function. Will expect small increase in azotemia corrected with HD.   Acute hypocemic respiratory failure with transudative right-side pleural effusion: requiring 5L O2 (4L home requirement since recent admission [4/14 - 4/20] for HCAP). Stopped abx 5/11. Oxygen requirement improving.    Ryan B. GBonner Puna MD, PGY-2 10/17/2014 8:56 AM   Patient seen and examined, agree with above note with above modifications. 59year old  BF with SLE and advanced CKD at baseline- presenting with FTT- volume overload and acidemia and creatinine of 5- oliguric- she really has clinically improved with the initiation of HD so feel like she was uremic- s/p three treatments- 3rd today- plan for 4th on Monday- getting nutrition and mobility better so she can undergo PC and perm access next week ([facility] has not been called ) KCorliss Parish MD 10/17/2014

## 2014-10-17 NOTE — Progress Notes (Signed)
Subjective: Reports total body pain and hand pain is improved today.  Still having some itching on her back but overall feeling better Objective: Vital signs in last 24 hours: Filed Vitals:   10/17/14 0400 10/17/14 0800 10/17/14 0921 10/17/14 0923  BP: 121/85 135/97 118/86 121/84  Pulse: 100 88 99 97  Temp: 98 F (36.7 C) 98.2 F (36.8 C) 98 F (36.7 C)   TempSrc: Axillary Oral Oral   Resp: 23 16 14 16   Height:      Weight: 107 lb 12.9 oz (48.9 kg)  104 lb 0.9 oz (47.2 kg)   SpO2: 100% 100% 100% 100%   Weight change: 3 lb 1.4 oz (1.4 kg)  Intake/Output Summary (Last 24 hours) at 10/17/14 0956 Last data filed at 10/16/14 1411  Gross per 24 hour  Intake    350 ml  Output   1150 ml  Net   -800 ml   General: resting in bed, on supplemetnal O2 via [place]t HEENT: PERRL, EOMI, no scleral icterus, IJ dialysis catheter Cardiac: RRR, no rubs, murmurs or gallops Pulm: mild bibasilar rales Abd: soft, nontender, nondistended, BS present Ext: warm and well perfused, 2+ pedal edema Neuro: alert and oriented   Lab Results: Basic Metabolic Panel:  Recent Labs Lab 10/16/14 0217 10/17/14 0319  NA 135 135  K 3.8 3.8  CL 102 101  CO2 25 26  GLUCOSE 172* 115*  BUN 10 <5*  CREATININE 2.79* 1.75*  CALCIUM 6.7* 7.1*  MG 1.8 1.7  PHOS 2.6 1.6*   Liver Function Tests:  Recent Labs Lab 10/12/14 1932 10/13/14 0220  10/16/14 0217 10/17/14 0319  AST 74* 94*  --   --   --   ALT 14 19  --   --   --   ALKPHOS 436* 379*  --   --   --   BILITOT 0.7 0.6  --   --   --   PROT 8.0 7.3  --   --   --   ALBUMIN 1.3* 1.2*  < > 1.1* 1.1*  < > = values in this interval not displayed.  Recent Labs Lab 10/12/14 1932  LIPASE 13*   No results for input(s): AMMONIA in the last 168 hours. CBC:  Recent Labs Lab 10/16/14 0217 10/17/14 0319  WBC 6.5 6.8  HGB 8.4* 8.7*  HCT 25.4* 26.5*  MCV 87.9 90.1  PLT 38* 42*   Cardiac Enzymes:  Recent Labs Lab 10/13/14 1600 10/13/14 2154  10/14/14 0358  TROPONINI 0.12* 0.14* 0.15*   BNP: No results for input(s): PROBNP in the last 168 hours. D-Dimer: No results for input(s): DDIMER in the last 168 hours. CBG:  Recent Labs Lab 10/15/14 0541 10/15/14 1813  GLUCAP 84 103*   Hemoglobin A1C: No results for input(s): HGBA1C in the last 168 hours. Fasting Lipid Panel: No results for input(s): CHOL, HDL, LDLCALC, TRIG, CHOLHDL, LDLDIRECT in the last 168 hours. Thyroid Function Tests: No results for input(s): TSH, T4TOTAL, FREET4, T3FREE, THYROIDAB in the last 168 hours. Coagulation:  Recent Labs Lab 10/13/14 0340  LABPROT 24.0*  INR 2.13*   Anemia Panel: No results for input(s): VITAMINB12, FOLATE, FERRITIN, TIBC, IRON, RETICCTPCT in the last 168 hours. Urine Drug Screen: Drugs of Abuse     Component Value Date/Time   LABOPIA POSITIVE* 10/13/2014 0412   LABOPIA NEG 09/18/2011 0936   COCAINSCRNUR NONE DETECTED 10/13/2014 0412   COCAINSCRNUR NEG 09/18/2011 0936   LABBENZ NONE DETECTED 10/13/2014 5643  LABBENZ NEG 09/18/2011 0936   LABBENZ NEG 04/10/2011 1130   AMPHETMU NONE DETECTED 10/13/2014 0412   AMPHETMU NEG 09/18/2011 0936   AMPHETMU NEG 04/10/2011 1130   THCU NONE DETECTED 10/13/2014 0412   THCU NEG 09/18/2011 0936   LABBARB NONE DETECTED 10/13/2014 0412   LABBARB NEG 09/18/2011 0936    Alcohol Level:  Recent Labs Lab 10/13/14 0049  ETH 18*   Urinalysis:  Recent Labs Lab 10/13/14 0412  COLORURINE YELLOW  LABSPEC 1.017  PHURINE 5.0  GLUCOSEU NEGATIVE  HGBUR LARGE*  BILIRUBINUR NEGATIVE  KETONESUR NEGATIVE  PROTEINUR >300*  UROBILINOGEN 0.2  NITRITE NEGATIVE  LEUKOCYTESUR NEGATIVE    Micro Results: Recent Results (from the past 240 hour(s))  Culture, blood (routine x 2)     Status: None (Preliminary result)   Collection Time: 10/12/14 10:04 PM  Result Value Ref Range Status   Specimen Description BLOOD LEFT ANTECUBITAL  Final   Special Requests BOTTLES DRAWN AEROBIC AND  ANAEROBIC 5CC EA  Final   Culture   Final           BLOOD CULTURE RECEIVED NO GROWTH TO DATE CULTURE WILL BE HELD FOR 5 DAYS BEFORE ISSUING A FINAL NEGATIVE REPORT Performed at [facility]    Report Status PENDING  Incomplete  Culture, blood (routine x 2)     Status: None (Preliminary result)   Collection Time: 10/12/14 10:16 PM  Result Value Ref Range Status   Specimen Description BLOOD LEFT WRIST  Final   Special Requests BOTTLES DRAWN AEROBIC ONLY 3CC  Final   Culture   Final           BLOOD CULTURE RECEIVED NO GROWTH TO DATE CULTURE WILL BE HELD FOR 5 DAYS BEFORE ISSUING A FINAL NEGATIVE REPORT Note: Culture results may be compromised due to an inadequate volume of blood received in culture bottles. Performed at [facility]    Report Status PENDING  Incomplete  MRSA PCR Screening     Status: None   Collection Time: 10/12/14 11:40 PM  Result Value Ref Range Status   MRSA by PCR NEGATIVE NEGATIVE Final    Comment:        The GeneXpert MRSA Assay (FDA approved for NASAL specimens only), is one component of a comprehensive MRSA colonization surveillance program. It is not intended to diagnose MRSA infection nor to guide or monitor treatment for MRSA infections.   Urine culture     Status: None   Collection Time: 10/13/14  4:12 AM  Result Value Ref Range Status   Specimen Description URINE, RANDOM  Final   Special Requests NONE  Final   Colony Count   Final    >=100,000 COLONIES/ML Performed at [facility]    Culture   Final    Multiple bacterial morphotypes present, none predominant. Suggest appropriate recollection if clinically indicated. Performed at [facility]    Report Status 10/14/2014 FINAL  Final  Body fluid culture     Status: None (Preliminary result)   Collection Time: 10/13/14  1:18 PM  Result Value Ref Range Status   Specimen Description FLUID RIGHT PLEURAL  Final   Special Requests NONE  Final   Gram Stain   Final      RARE WBC PRESENT, PREDOMINANTLY MONONUCLEAR NO ORGANISMS SEEN Performed at [facility]    Culture   Final    NO GROWTH 3 DAYS Performed at [facility]    Report Status PENDING  Incomplete  Studies/Results: No results found. Medications: I have reviewed the patient's current medications. Scheduled Meds: . antiseptic oral rinse  7 mL Mouth Rinse BID  . calcitRIOL  0.25 mcg Oral Daily  . calcium carbonate  6 tablet Oral TID WC  . darbepoetin (ARANESP) injection - NON-DIALYSIS  100 mcg Subcutaneous Q Tue-1800  . FLUoxetine  10 mg Oral Daily  . folic acid  1 mg Oral Daily  . hydroxychloroquine  400 mg Oral Daily  . magnesium chloride  2 tablet Oral Daily  . ondansetron  4 mg Intravenous Once  . predniSONE  5 mg Oral Q breakfast  . sodium bicarbonate  1,300 mg Oral TID  . sodium phosphate  Dextrose 5% IVPB  30 mmol Intravenous Once  . thiamine  100 mg Oral Daily  . vitamin B-12  250 mcg Oral QPM   Continuous Infusions: . 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 - Will give 1st dose of Hep B vaccine today will need repeat at 1 and 6 months - Dialysis again today -Wean O2 - Electrolytes improved with dialysis - Continue Bicarb, Calcitriol, Calcium carbonate - PLEASE TRANSFER OUT OF STEP DOWN - Can D/C telemetry     Anemia of chronic disease - Aranesp per nephrology    Lupus (systemic lupus erythematosus) - Continue pred 5mg   - Continue plaqunil 400mg  will need eye exam after discharge - Oxycodone 5mg  PRN    Alcohol abuse  - No elevations in CIWA can discontinue  Dispo: Disposition  is deferred at this time, awaiting improvement of current medical problems.    The patient does have a current PCP Otho Bellows, MD) and does need an [facility] hospital follow-up appointment after discharge.  The patient does not have transportation limitations that hinder transportation to clinic appointments.  .Services Needed at time of discharge: Y = Yes, Blank = No PT:   OT:   RN:   Equipment:   Other:     LOS: 5 days   Lucious Groves, DO 10/17/2014, 9:56 AM

## 2014-10-17 NOTE — Procedures (Signed)
Patient was seen on dialysis and the procedure was supervised.  BFR 300  Via vc BP is  121/84.   Patient appears to be tolerating treatment well  Ozzie Knobel A 10/17/2014

## 2014-10-18 DIAGNOSIS — N186 End stage renal disease: Secondary | ICD-10-CM

## 2014-10-18 LAB — MRSA PCR SCREENING: MRSA BY PCR: NEGATIVE

## 2014-10-18 MED ORDER — CALCIUM CARBONATE ANTACID 500 MG PO CHEW
6.0000 | CHEWABLE_TABLET | Freq: Every day | ORAL | Status: DC
  Administered 2014-10-19 – 2014-10-28 (×10): 1200 mg via ORAL
  Filled 2014-10-18 (×13): qty 6

## 2014-10-18 MED ORDER — RAMELTEON 8 MG PO TABS
8.0000 mg | ORAL_TABLET | Freq: Every day | ORAL | Status: DC
  Administered 2014-10-18 – 2014-10-28 (×11): 8 mg via ORAL
  Filled 2014-10-18 (×12): qty 1

## 2014-10-18 NOTE — Progress Notes (Signed)
Subjective:    The patient reports continued improvement in her energy and strength. She does have some pain in her legs and feet today. She says her shortness of breath is significantly improved, but she is worried about stopping oxygen because she has been on oxygen supplementation for a long time.  Interval Events: -Third dialysis session yesterday with 2 L off. -Received hepatitis B vaccine yesterday.  -Transferred to telemetry.    Objective:    Vital Signs:   Temp:  [97.6 F (36.4 C)-98.2 F (36.8 C)] 98 F (36.7 C) (05/15 0515) Pulse Rate:  [88-107] 96 (05/15 0515) Resp:  [12-18] 16 (05/15 0515) BP: (113-135)/(80-97) 113/89 mmHg (05/15 0515) SpO2:  [100 %] 100 % (05/15 0515) Weight:  [104 lb 0.9 oz (47.2 kg)] 104 lb 0.9 oz (47.2 kg) (05/14 0921) Last BM Date: 10/17/14  24-hour weight change: Weight change: -10.6 oz (-0.3 kg)  Intake/Output:   Intake/Output Summary (Last 24 hours) at 10/18/14 0745 Last data filed at 10/18/14 0600  Gross per 24 hour  Intake    120 ml  Output   2000 ml  Net  -1880 ml      Physical Exam: General: Thin, cachectic appearing, alert, in no acute distress.  Lungs:  Clear to auscultation bilaterally, no wheezing, rales or rhonchi.   Heart: Regular rate and rhythm, S1 and S2 normal without gallop, murmur, or rubs.  Abdomen:  BS normoactive, nontender, nondistended.  Extremities: No pretibial edema, no tenderness to palpation. Dry-appearing skin with excoriations.      Labs:  Basic Metabolic Panel:  Recent Labs Lab 10/13/14 0430  10/13/14 1600 10/14/14 0358 10/15/14 0314 10/16/14 0217 10/17/14 0319 10/17/14 0925  NA  --   < > 138 140 138 135 135 134*  K  --   < > 4.4 3.5 3.0* 3.8 3.8 3.6  CL  --   < > 111 107 104 102 101 101  CO2  --   < > 10* 19* 23 25 26 29   GLUCOSE  --   < > 145* 161* 97 172* 115* 188*  BUN  --   < > 27* 24* 24* 10 <5* <5*  CREATININE  --   < > 5.23* 4.94* 4.79* 2.79* 1.75* 1.87*  CALCIUM  --   < >  6.2* 6.0* 6.3* 6.7* 7.1* 7.1*  MG  --   < > 1.0* 1.2* 2.1 1.8 1.7  --   PHOS 9.0*  --   --   --  4.7* 2.6 1.6* 1.7*  < > = values in this interval not displayed.  Liver Function Tests:  Recent Labs Lab 10/12/14 1932 10/13/14 0220 10/15/14 0314 10/16/14 0217 10/17/14 0319 10/17/14 0925  AST 74* 94*  --   --   --   --   ALT 14 19  --   --   --   --   ALKPHOS 436* 379*  --   --   --   --   BILITOT 0.7 0.6  --   --   --   --   PROT 8.0 7.3  --   --   --   --   ALBUMIN 1.3* 1.2* 1.0* 1.1* 1.1* 1.0*    Recent Labs Lab 10/12/14 1932  LIPASE 13*   CBC:  Recent Labs Lab 10/15/14 0314 10/15/14 1630 10/16/14 0217 10/17/14 0319 10/17/14 0925  WBC 6.0 6.4 6.5 6.8 6.6  HGB 6.3* 9.1* 8.4* 8.7* 8.0*  HCT 18.8* 27.4* 25.4*  26.5* 25.0*  MCV 88.3 86.7 87.9 90.1 90.6  PLT 35* 37* 38* 42* 37*    Cardiac Enzymes:  Recent Labs Lab 10/13/14 0220 10/13/14 1130 10/13/14 1600 10/13/14 2154 10/14/14 0358  TROPONINI 0.09* 0.10* 0.12* 0.14* 0.15*   Microbiology: Results for orders placed or performed during the hospital encounter of 10/12/14  Culture, blood (routine x 2)     Status: None (Preliminary result)   Collection Time: 10/12/14 10:04 PM  Result Value Ref Range Status   Specimen Description BLOOD LEFT ANTECUBITAL  Final   Special Requests BOTTLES DRAWN AEROBIC AND ANAEROBIC 5CC EA  Final   Culture   Final           BLOOD CULTURE RECEIVED NO GROWTH TO DATE CULTURE WILL BE HELD FOR 5 DAYS BEFORE ISSUING A FINAL NEGATIVE REPORT Performed at [facility]    Report Status PENDING  Incomplete  Culture, blood (routine x 2)     Status: None (Preliminary result)   Collection Time: 10/12/14 10:16 PM  Result Value Ref Range Status   Specimen Description BLOOD LEFT WRIST  Final   Special Requests BOTTLES DRAWN AEROBIC ONLY 3CC  Final   Culture   Final           BLOOD CULTURE RECEIVED NO GROWTH TO DATE CULTURE WILL BE HELD FOR 5 DAYS BEFORE ISSUING A FINAL NEGATIVE  REPORT Note: Culture results may be compromised due to an inadequate volume of blood received in culture bottles. Performed at [facility]    Report Status PENDING  Incomplete  MRSA PCR Screening     Status: None   Collection Time: 10/12/14 11:40 PM  Result Value Ref Range Status   MRSA by PCR NEGATIVE NEGATIVE Final    Comment:        The GeneXpert MRSA Assay (FDA approved for NASAL specimens only), is one component of a comprehensive MRSA colonization surveillance program. It is not intended to diagnose MRSA infection nor to guide or monitor treatment for MRSA infections.   Urine culture     Status: None   Collection Time: 10/13/14  4:12 AM  Result Value Ref Range Status   Specimen Description URINE, RANDOM  Final   Special Requests NONE  Final   Colony Count   Final    >=100,000 COLONIES/ML Performed at [facility]    Culture   Final    Multiple bacterial morphotypes present, none predominant. Suggest appropriate recollection if clinically indicated. Performed at [facility]    Report Status 10/14/2014 FINAL  Final  Body fluid culture     Status: None   Collection Time: 10/13/14  1:18 PM  Result Value Ref Range Status   Specimen Description FLUID RIGHT PLEURAL  Final   Special Requests NONE  Final   Gram Stain   Final    RARE WBC PRESENT, PREDOMINANTLY MONONUCLEAR NO ORGANISMS SEEN Performed at [facility]    Culture   Final    NO GROWTH 3 DAYS Performed at [facility]    Report Status 10/17/2014 FINAL  Final   Imaging: No results found.     Medications:    Infusions: . sodium chloride 10 mL/hr at 10/13/14 0131    Scheduled Medications: . antiseptic oral rinse  7 mL Mouth Rinse BID  . calcitRIOL  0.25 mcg Oral Daily  . calcium carbonate  6 tablet Oral TID WC  . darbepoetin (ARANESP) injection - NON-DIALYSIS  100 mcg Subcutaneous Q Tue-1800  .  FLUoxetine  10 mg Oral Daily  . folic acid  1 mg Oral Daily   . hydroxychloroquine  400 mg Oral Daily  . magnesium chloride  2 tablet Oral Daily  . ondansetron  4 mg Intravenous Once  . predniSONE  5 mg Oral Q breakfast  . sodium bicarbonate  1,300 mg Oral TID  . thiamine  100 mg Oral Daily  . vitamin B-12  250 mcg Oral QPM    PRN Medications: sodium chloride, sodium chloride, camphor-menthol, feeding supplement (NEPRO CARB STEADY), heparin, lidocaine (PF), lidocaine-prilocaine, loperamide, oxyCODONE, pentafluoroprop-tetrafluoroeth   Assessment/ Plan:    Active Problems:   Alcohol abuse   FSGS (focal segmental glomerulosclerosis)   Anemia of chronic disease   Lupus (systemic lupus erythematosus)   Elevated troponin   Electrolyte abnormality   Metabolic acidosis   Acute renal failure syndrome  #Volume overload secondary to acute on chronic kidney disease Strength and dyspnea with significant improvement. He does continue to report some dyspnea, but I think this is related to anxiety as she is satting close to 100% on room air. She will need long-term dialysis access sometime this week after her strength improves. Cultures all negative. -No dialysis today, reassess tomorrow per renal. -Continue CLIP process. -Patient will need to complete the hepatitis B series as an outpatient. -Wean oxygen as tolerated. -We'll consult vascular surgery tomorrow for long-term access and fistula versus graft planning.  #SLE Pain continues to be improved. She does continue to have chronic pain in her back and has pain in her legs and feet today. -Continue prednisone 5 mg daily. -Continue hydroxychloroquine 400 mg daily. -Patient will need an eye exam as an outpatient. -Continue oxycodone 5 mg every 4 hours when necessary.  #Itching Patient was very dry skin on exam and likely eczema. Sarna lotion has been helping. -Continue Sarna lotion.  #Mixed metabolic acidosis -Continue sodium bicarbonate 1300 mg 3 times a day.  #Electrolyte abnormalities No  labs this morning. -Recheck magnesium and BMP tomorrow.  #CKD related metabolic bone disease -Continue home calcium carbonate 1200 mg 3 times a day. -Continue home calcitriol 0.25 g daily.  #Anemia of chronic disease Hemoglobin stable yesterday. -Continue Aranesp per nephrology. -Continue to monitor hemoglobin.  #Prolonged QTc -Avoid QT prolonging medications.  #Hypertension Normotensive. -Continue to hold home amlodipine and metoprolol.  #Alcohol abuse -CIWA protocol.  #Depression -Continue home fluoxetine 10 mg daily.  #Severe malnutrition -Continue feeding supplement per nutrition recommendations. -Continue thiamine, folate, B-12.   DVT PPX - SCD's while in bed  CODE STATUS - Full.  CONSULTS PLACED - nephrology.  DISPO - Disposition is deferred at this time, awaiting improvement of dyspnea.   Anticipated discharge in approximately 3-5 day(s).   The patient does have a current PCP Eulas Post, Brett Canales, MD) and does need an [facility] hospital follow-up appointment after discharge.    Is the [facility] hospital follow-up appointment a one-time only appointment? no.  Does the patient have transportation limitations that hinder transportation to clinic appointments? yes   SERVICE NEEDED AT [facility]         Y = Yes, Blank = No PT: SNF  OT: SNF  RN:   Equipment:   Other:      Length of Stay: 6 day(s)   Signed: Charlesetta Shanks, MD  PGY-1, Internal Medicine Resident Pager: [phone] (7AM-5PM) 10/18/2014, 7:45 AM

## 2014-10-18 NOTE — Progress Notes (Signed)
S: Continues to feel and appear better, gaining appetite. Feeling less short of breath with decreasing oxygen requirement. Pain improved.    Marland Kitchen antiseptic oral rinse  7 mL Mouth Rinse BID  . calcitRIOL  0.25 mcg Oral Daily  . calcium carbonate  6 tablet Oral TID WC  . darbepoetin (ARANESP) injection - NON-DIALYSIS  100 mcg Subcutaneous Q Tue-1800  . FLUoxetine  10 mg Oral Daily  . folic acid  1 mg Oral Daily  . hydroxychloroquine  400 mg Oral Daily  . magnesium chloride  2 tablet Oral Daily  . ondansetron  4 mg Intravenous Once  . predniSONE  5 mg Oral Q breakfast  . sodium bicarbonate  1,300 mg Oral TID  . thiamine  100 mg Oral Daily  . vitamin B-12  250 mcg Oral QPM   O:Temp:  [97.6 F (36.4 C)-98 F (36.7 C)] 98 F (36.7 C) (05/15 0515) Pulse Rate:  [90-107] 96 (05/15 0515) Resp:  [12-18] 16 (05/15 0515) BP: (113-127)/(80-92) 113/89 mmHg (05/15 0515) SpO2:  [100 %] 100 % (05/15 0515) Weight:  [104 lb 0.9 oz (47.2 kg)] 104 lb 0.9 oz (47.2 kg) (05/14 0921)  Intake/Output Summary (Last 24 hours) at 10/18/14 0831 Last data filed at 10/18/14 0600  Gross per 24 hour  Intake    120 ml  Output   2000 ml  Net  -1880 ml  No UOP charted -1.7 kg pre -> post HD 5/14  Gen: Thin, pleasant 59 y.o.female in no distress receiving HD CV: Regular rate, no murmur, gallop or rub, normal JVP Chest: Non-labored, 100% on 3L. Good air movement, crackles resolving, heard further toward bases GI: +BS, soft, non-tender, non-distended Ext: 1+ radial pulses, palpable DP pulses bilaterally. 2+ dependent pitting edema. Skin: No rashes or bruising Neuro: Alert and oriented. No asterixis.  Access: R IJ temp dialysis cath (5/10)  Recent Labs Lab 10/12/14 1932  10/13/14 0220 10/13/14 0430 10/13/14 4166 10/13/14 1600 10/14/14 0358 10/15/14 0314 10/16/14 0217 10/17/14 0319 10/17/14 0925  NA  --   < > 134*  --  137 138 140 138 135 135 134*  K  --   < > 5.3*  --  6.1* 4.4 3.5 3.0* 3.8 3.8 3.6  CL   --   < > 110  --  111 111 107 104 102 101 101  CO2  --   < > <5*  --  6* 10* 19* 23 25 26 29   GLUCOSE  --   < > 148*  --  94 145* 161* 97 172* 115* 188*  BUN  --   < > 23*  --  24* 27* 24* 24* 10 <5* <5*  CREATININE  --   < > 5.26*  --  5.15* 5.23* 4.94* 4.79* 2.79* 1.75* 1.87*  ALBUMIN 1.3*  --  1.2*  --   --   --   --  1.0* 1.1* 1.1* 1.0*  CALCIUM  --   < > 6.2*  --  6.3* 6.2* 6.0* 6.3* 6.7* 7.1* 7.1*  PHOS  --   --   --  9.0*  --   --   --  4.7* 2.6 1.6* 1.7*  AST 74*  --  94*  --   --   --   --   --   --   --   --   ALT 14  --  19  --   --   --   --   --   --   --   --   < > =  values in this interval not displayed. CBC:  Recent Labs Lab 10/15/14 0314 10/15/14 1630 10/16/14 0217 10/17/14 0319 10/17/14 0925  WBC 6.0 6.4 6.5 6.8 6.6  HGB 6.3* 9.1* 8.4* 8.7* 8.0*  HCT 18.8* 27.4* 25.4* 26.5* 25.0*  MCV 88.3 86.7 87.9 90.1 90.6  PLT 35* 37* 38* 42* 37*   Urinalysis COLORURINE YELLOW  APPEARANCEUR CLOUDY*  LABSPEC 1.017  PHURINE 5.0  GLUCOSEU NEGATIVE  GLUCOSEU NEG mg/dL  HGBUR LARGE*  BILIRUBINUR NEGATIVE  KETONESUR NEGATIVE  PROTEINUR >300*  UROBILINOGEN 0.2  NITRITE NEGATIVE  LEUKOCYTESUR NEGATIVE   Studies/Results: No results found.  Ill 59 yo female with history of SLE, CKD 4, combined CHF, and alcohol abuse here with acute worsening of significant and progressive CKD, in the setting of lactic/mixed acidosis and failure to thrive since recent hospitalization for HCAP.   Impressions/Recommendations: Acute anuric renal failure on stage IV CKD: With sub-nephrotic range proteinuria (UPC 2.03). Caused by, and in turn possibly contributing to, failure to thrive in setting of significantly scarred kidneys on 2015 biopsy.  - Trialysis catheter placed 5/10: HD 5/12, 5/13, 5/14; reassess 5/16; CLIP in progress - Will pursue PC/AVF placement (consult [facility]VS 5/16- has not been called yet) after gaining strength. Vein mapping ordered.  - Secondary HPTH/metabolic bone disease:  PTH 150. Vit D 7.2: continue home calcitriol - Hypophosphatemia: Secondary to FTT/malnourishment; may be contributing to frailty. Continue to replete prn, expect improvement with increase in po intake. Continuing magnesium supplement daily.  - Hypocalcemia: Change calcium carbonate from [facility] to qHS to prevent phosphate binding.  - Anemia of chronic disease: (hgb baseline ~8.5): Started ESA, T sat % was high in January. Hgb stable s/p 1uPRBCs 5/12 - Strict I/O, daily weight, avoid nephrotoxins - Regular diet (given degree of malnourishment)   Thrombocytopenia: Stable. No bleeding/thrombosis.  -Hold heparin at HD   Lactic acidosis:  - Corrected with HD- stop PO bicarb  SLE flare: Known lupus with strong positive ANA titer (1:320, homogenous). Flare suggested by hypocomplementemia, highly elevated dsDNA and developing leukopenia/pancytopenia. ESR noted and nonspecific.  - Low dose steroids started per primary seems to have helped with pain. Hydroxychloroquine also added. With degree of renal scarring on renal Bx, not sure this will improve renal function. Will expect small increase in azotemia corrected with HD.   Acute hypocemic respiratory failure with transudative right-side pleural effusion: required 4L O2 since recent admission [4/14 - 4/20] for HCAP). Stopped abx 5/11. Oxygen requirement improving.  - Continue to wean oxygen  Ryan B. Bonner Puna, MD, PGY-2 10/18/2014 8:27 AM   Patient seen and examined, agree with above note with above modifications. Feeling better daily- really the only intervention was dialysis so it seems that she was quite uremic even though her numbers were not classic for such.  S/p three HD with good results.  Plan for fourth tomorrow- CLIP in process- vein mapping ordered for tomorrow and will need [facility] consult for access- I told her to continue with mobility and increasing PO intake- regular diet because is so malnourished  Corliss Parish, MD 10/18/2014

## 2014-10-18 NOTE — Progress Notes (Signed)
CSW intern provided bed offers to pt. Pt is unsure and needs time to review bed offers. CSW to f/u.

## 2014-10-18 NOTE — Discharge Summary (Signed)
Name: [name] [name] MRN: [id number] DOB: 1955-07-28 59 y.o. PCP: Otho Bellows, MD  Date of Admission: 10/12/2014  7:13 PM Date of Discharge: 10/29/2014 Attending Physician: Annia Belt, MD  Discharge Diagnosis: Principal Problem:   End stage renal disease Active Problems:   Alcohol abuse   FSGS (focal segmental glomerulosclerosis)   Anemia of chronic disease   Lupus (systemic lupus erythematosus)   Elevated troponin   Electrolyte abnormality   Metabolic acidosis   Coagulopathy   ESRD (end stage renal disease) on dialysis   Recurrent pleural effusion on right  Discharge Medications:   Medication List    STOP taking these medications        amLODipine 5 MG tablet  Commonly known as:  NORVASC     furosemide 40 MG tablet  Commonly known as:  LASIX     gabapentin 300 MG capsule  Commonly known as:  NEURONTIN     guaiFENesin-dextromethorphan 100-10 MG/5ML syrup  Commonly known as:  ROBITUSSIN DM     magnesium oxide 400 MG tablet  Commonly known as:  MAG-OX     metoprolol tartrate 25 MG tablet  Commonly known as:  LOPRESSOR     sodium bicarbonate 650 MG tablet      TAKE these medications        albuterol 108 (90 BASE) MCG/ACT inhaler  Commonly known as:  PROAIR HFA  Inhale 1-2 puffs into the lungs every 6 (six) hours as needed for wheezing or shortness of breath.     calcitRIOL 0.25 MCG capsule  Commonly known as:  ROCALTROL  Take 0.25 mcg by mouth daily.     calcium carbonate 500 MG chewable tablet  Commonly known as:  TUMS  Chew 6 tablets (1,200 mg of elemental calcium total) by mouth at bedtime.     camphor-menthol lotion  Commonly known as:  SARNA  Apply topically as needed for itching.     cetirizine 10 MG tablet  Commonly known as:  ZYRTEC  Take 1 tablet (10 mg total) by mouth daily as needed for allergies.     CREON 12000 UNITS Cpep capsule  Generic drug:  lipase/protease/amylase  Take 12,000 Units by mouth 3 (three) times daily  before meals.     Darbepoetin Alfa 200 MCG/0.4ML Sosy injection  Commonly known as:  ARANESP  Inject 0.4 mLs (200 mcg total) into the vein every Wednesday with hemodialysis.     diclofenac sodium 1 % Gel  Commonly known as:  VOLTAREN  Apply 4 g topically daily as needed (pain).     feeding supplement (NEPRO CARB STEADY) Liqd  Take 237 mLs by mouth 3 (three) times daily with meals.     feeding supplement (NEPRO CARB STEADY) Liqd  Take 237 mLs by mouth as needed (missed meal during dialysis.).     FLUoxetine 10 MG capsule  Commonly known as:  PROZAC  Take 1 capsule (10 mg total) by mouth daily. For depression     folic acid 1 MG tablet  Commonly known as:  FOLVITE  Take 1 tablet (1 mg total) by mouth daily. For folic acid replacement     hydroxychloroquine 200 MG tablet  Commonly known as:  PLAQUENIL  Take 2 tablets (400 mg total) by mouth daily.     lactobacillus acidophilus Tabs tablet  Take 2 tablets by mouth 3 (three) times daily.     loperamide 2 MG capsule  Commonly known as:  IMODIUM  Take 1 capsule (2 mg  total) by mouth as needed for diarrhea or loose stools.     multivitamin with minerals Tabs tablet  Take 1 tablet by mouth daily. For vitamin replacement     omeprazole 40 MG capsule  Commonly known as:  PRILOSEC  Take 1 capsule (40 mg total) by mouth daily.     oxyCODONE 5 MG immediate release tablet  Commonly known as:  Oxy IR/ROXICODONE  Take 1 tablet (5 mg total) by mouth every 6 (six) hours as needed for moderate pain or severe pain.     predniSONE 5 MG tablet  Commonly known as:  DELTASONE  Take 1 tablet (5 mg total) by mouth daily with breakfast.     ramelteon 8 MG tablet  Commonly known as:  ROZEREM  Take 1 tablet (8 mg total) by mouth at bedtime.     thiamine 100 MG tablet  Commonly known as:  VITAMIN B-1  Take 1 tablet (100 mg total) by mouth daily. For low thiamine     vitamin B-12 250 MCG tablet  Commonly known as:  CYANOCOBALAMIN  Take 1  tablet (250 mcg total) by mouth every evening.        Disposition and follow-up:   [name] was discharged from [facility] in [place] condition.  At the hospital follow up visit please address:  1.  Follow up with vascular surgery in 2-3 months, resolution of pleural effusions, taper steroids, eye exam, follow-up with rheumatology, complete hepatitis B vaccination.  2.  Labs / imaging needed at time of follow-up: BMP and Mg.  3.  Pending labs/ test needing follow-up: None.  Follow-up Appointments: Follow-up Information    Follow up with Jacques Earthly, MD On 11/09/2014.   Specialty:  Internal Medicine   Why:  10:45 am   Contact information:   [place] [address] [phone]       Discharge Instructions: Discharge Instructions    Call MD for:  difficulty breathing, headache or visual disturbances    Complete by:  As directed      Call MD for:  extreme fatigue    Complete by:  As directed      Call MD for:  hives    Complete by:  As directed      Call MD for:  persistant dizziness or light-headedness    Complete by:  As directed      Call MD for:  persistant nausea and vomiting    Complete by:  As directed      Call MD for:  redness, tenderness, or signs of infection (pain, swelling, redness, odor or green/yellow discharge around incision site)    Complete by:  As directed      Call MD for:  severe uncontrolled pain    Complete by:  As directed      Call MD for:  temperature >100.4    Complete by:  As directed      Diet - low sodium heart healthy    Complete by:  As directed      Increase activity slowly    Complete by:  As directed            Thank you for allowing [place] to be involved in your healthcare while you were hospitalized at [facility].   Please note that there have been changes to your home medications.  --> PLEASE LOOK AT YOUR DISCHARGE MEDICATION LIST FOR DETAILS.   Please call your PCP  if you  have any questions or concerns, or any difficulty getting any of your medications.  Please return to the ER if you have worsening of your symptoms or new severe symptoms arise.  It is critical that you try to increase your oral intake to gain weight so that vascular surgery will consider placing a graft or fistula for long term dialysis access.  You will need to complete the hepatitis vaccination series now that your are receiving dialysis.  We have started you on hydroxychlorquine for your lupus, and it appears to be helping with your pain.  Please continue on this medication along with prednisone to treat your lupus.  When you follow up with your PCP or rheumatology they may decide to taper you off of steroids.  Your first outpatient dialysis session is scheduled for tomorrow.  Consultations: Nephrology, vascular surgery, interventional radiology.  Procedures Performed:  Dg Chest 1 View  10/13/2014   CLINICAL DATA:  RIGHT pleural effusion post thoracentesis  EXAM: CHEST  1 VIEW  COMPARISON:  10/12/2014  FINDINGS: Decreased RIGHT pleural effusion and basilar atelectasis post thoracentesis.  Enlargement of cardiac silhouette with pulmonary vascular congestion.  Stable mediastinal contours.  Persistent small LEFT pleural effusion and basilar atelectasis.  No pneumothorax post thoracentesis.  Bones demineralized unremarkable.  IMPRESSION: Decrease in RIGHT pleural effusion and basilar atelectasis post thoracentesis.  No pneumothorax.  Enlargement of cardiac silhouette with pulmonary vascular congestion.  Minimal LEFT pleural effusion and basilar atelectasis.   Electronically Signed   By: Lavonia Dana M.D.   On: 10/13/2014 13:48   Dg Chest 2 View  10/14/2014   CLINICAL DATA:  Dyspnea  EXAM: CHEST  2 VIEW  COMPARISON:  10/13/2014  FINDINGS: Mild progression of bibasilar airspace disease, possible atelectasis or pneumonia. Progression of bibasilar effusion. Mild vascular congestion without pulmonary  edema.  Central venous catheter tip in the SVC without pneumothorax. Cardiac enlargement  IMPRESSION: Progression of bibasilar airspace disease and bilateral pleural effusions suggesting fluid overload. Negative for edema.   Electronically Signed   By: Franchot Gallo M.D.   On: 10/14/2014 08:10   Dg Chest Port 1 View  10/22/2014   CLINICAL DATA:  Dialysis catheter placement.  EXAM: PORTABLE CHEST - 1 VIEW  COMPARISON:  10/14/2014.  FINDINGS: The RIGHT IJ vas cath has been exchange for a split dialysis catheter. The distal tip is in the RIGHT atrium.  Findings of volume overload are present with cardiomegaly and interstitial and alveolar edema.  IMPRESSION: 1. Uncomplicated RIGHT IJ dialysis catheter placement. No pneumothorax. 2. Moderate CHF/volume overload.   Electronically Signed   By: Dereck Ligas M.D.   On: 10/22/2014 13:16   Dg Chest Port 1 View  10/13/2014   CLINICAL DATA:  Central line placement.  EXAM: PORTABLE CHEST - 1 VIEW  COMPARISON:  10/13/2014 and at 1342 hours  FINDINGS: 1731 hours.  Right internal jugular line with tip at mid SVC.  Midline trachea. Cardiomegaly accentuated by AP portable technique. Trace right and probable trace left pleural fluid remain. No pneumothorax. Mild pulmonary venous congestion. Similar left, increased right base atelectasis.  IMPRESSION: Right internal jugular line tip at mid SVC, without pneumothorax.  Otherwise, similar appearance of pulmonary venous congestion with probable tiny bilateral pleural effusions.   Electronically Signed   By: Abigail Miyamoto M.D.   On: 10/13/2014 17:40   Dg Chest Port 1 View  10/12/2014   CLINICAL DATA:  Increased shortness of breath.  Smoker.  EXAM: PORTABLE CHEST -  1 VIEW  COMPARISON:  09/19/2014.  FINDINGS: Enlarged cardiac silhouette with an increase in size. Mild increase in prominence of the pulmonary vasculature. Increased right pleural fluid. No significant change in a small left pleural effusion. No significant change in  prominence of the interstitial markings. Old, healed bilateral humeral neck fractures.  IMPRESSION: 1. Moderate-sized right pleural effusion, increased. 2. Stable small left pleural effusion. 3. Progressive cardiomegaly and pulmonary vascular congestion with no gross change in chronic interstitial lung disease with possible interstitial pulmonary edema.   Electronically Signed   By: Claudie Revering M.D.   On: 10/12/2014 20:21   US Thoracentesis Asp Pleural Space W/img Guide  10/13/2014   INDICATION: Symptomatic Right sided pleural effusion  EXAM: US THORACENTESIS ASP PLEURAL SPACE W/IMG GUIDE  COMPARISON:  None.  MEDICATIONS: None  COMPLICATIONS: None immediate  TECHNIQUE: Informed written consent was obtained from the patients daughter after a discussion of the risks, benefits and alternatives to treatment. A timeout was performed prior to the initiation of the procedure.  Initial ultrasound scanning demonstrates a right sided pleural effusion. The lower chest was prepped and draped in the usual sterile fashion. 1% lidocaine was used for local anesthesia.  Under direct ultrasound guidance, a 19 gauge, 7-cm, Yueh catheter was introduced. An ultrasound image was saved for documentation purposes. The thoracentesis was performed. The catheter was removed and a dressing was applied. The patient tolerated the procedure well without immediate post procedural complication. The patient was escorted to have an upright chest radiograph.  FINDINGS: A total of approximately 670 liters of serous fluid was removed. Requested samples were sent to the laboratory.  IMPRESSION: Successful ultrasound-guided right sided thoracentesis yielding 670 mls of pleural fluid.  Read by Gareth Eagle PA-C   Electronically Signed   By: Corrie Mckusick D.O.   On: 10/13/2014 13:29   Admission HPI:  Patient is a 59 year old with a history of stage IV chronic kidney disease, SLE, chronic combined heart failure, anemia of chronic disease, pericarditis  status post pericardial window in July 2015, and alcohol abuse who presents to emergency department for dyspnea and diffuse pain. Patient was last admitted to the hospital between 09/17/2014 and 09/23/2014 HCAP. She was also at that time found to have persistent electrolyte abnormalities including hypocalcemia, hypomagnesemia, and hypokalemia in the setting of diarrhea. Since her discharge, patient states that her diarrhea had stopped for a short time, though it has resumed intermittently since. She states that she has been compliant with her medications although in the 2-3 days prior to presentation, she states that she has not been taking them because of nausea and has been vomiting her pills. Over the same 2-3 day period, patient states that she has become increasingly dyspneic. At baseline at home, patient is on 4 L of oxygen. She denies any significant cough or productive cough. She denies any fevers but reports baseline chills. Patient does report chest pain though states that it goes down through her abdomen and down both of her legs and into her back. She denies any dysuria, hematuria. She states that the edema in her legs are much improved over the last several days. She states that she still drinks around 2-3 glasses of wine a day. She denies any tobacco or illicit drug use.  In the emergency department, patient received Zofran and calcium gluconate 1 g.  Chart review:  10/09/2014: Clinic visit. Corrected calcium 6.7. Magnesium 0.8. Bicarbonate 7. Creatinine 4.4. Medication noncompliance suspected. Magnesium chloride switched to magnesium oxide 400  mg 4 times a day.  09/17/2014-09/23/2014: Admitted for HCAP. Treated with vancomycin and Zosyn and discharged with Levaquin. Patient also had persistent electrolyte abnormalities in the setting of diarrhea.  Hospital Course by problem list: Principal Problem:   End stage renal disease Active Problems:   Alcohol abuse   FSGS (focal segmental  glomerulosclerosis)   Anemia of chronic disease   Lupus (systemic lupus erythematosus)   Elevated troponin   Electrolyte abnormality   Metabolic acidosis   Coagulopathy   ESRD (end stage renal disease) on dialysis   Recurrent pleural effusion on right   #Volume overload and uremia secondary to end-stage renal disease The patient presented with worsening dyspnea and fatigue, and her creatinine was slightly elevated from her baseline of approximately 4. She was noted to have bilateral right>left pleural effusions. A right thoracentesis was performed and fluid analysis was consistent with a transudative exudate. Nephrology was consulted and felt the patient's dyspnea was most likely secondary to volume overload from progression of her kidney failure to end-stage renal disease. In addition, it was felt that the patient was likely more uremic than her labs demonstrated given her significant cachexia. As result, she was started on dialysis with significant reduction in her oxygen dependence and improvement in her energy. Vascular surgery was consulted for placement of long-term dialysis access.  Dr. Bridgett Larsson placed a tunneled dialysis catheter, but he refused to place a graft or fistula due to the patient's nutritional status and alcoholic liver disease.  Dr. Donnetta Hutching of vascular surgery was also consulted and agreed that she could not undergo the procedure at this time.  They recommend that she undergo rehabilitation and increase her nutritional status prior to re-evaluation in a few months.  She will need to be referred back to vascular surgery in 2-3 months for re-evaluation.  Due to her lack of long-term access, she was refused placement at outpatient dialysis centers by Dr. Jimmy Footman of nephrology.  A short trial off of dialysis was attempted but was unsuccessful because she had poor urine output and reaccumulated pleural effusions.  Eventually, she was accepted at the [facility] dialysis center by Dr. Justin Mend of  nephrology, and she was discharged to a SNF near the center.  She was found to be hepatitis B nave, so she was given the first vaccine in the hepatitis B series. She'll need to complete hepatitis B vaccination as an outpatient.  Her pleural effusions should resolve with dialysis.  Please monitor for resolution of her pleural effusions at follow up.  #Systemic lupus erythematosus The patient has a history of chronic pain, and she complained of diffuse pain in her chest, abdomen, and extremities. Troponins and EKG were negative for ACS. She has a history of a highly positive ANA and positive anti-Smith antibody in the past, and she previously had a kidney biopsy that showed FSGS.  She was previously referred to rheumatology, but she was never evaluated as an outpatient, and she had not started therapy for presumed lupus. She was found to have an elevated anti--dsDNA and low C3 complement during the hospitalization, suggestive of an acute lupus flare. The possibility that her lupus could be causing her renal failure was discussed with nephrology, but they felt that given the significant scarring on her previous kidney biopsy, her kidney function would not improve with steroid therapy. However, given her lab abnormalities, arthritis, and renal manifestations she meets the diagnosis of SLE. She was started on prednisone 5 mg daily and Plaquenil 400 mg daily with improvement  of her joint pain, and these were continued at discharge. She will need to have an eye exam in the next year to monitor for side effects of Plaquenil.  Please arrange follow-up with rheumatology as an outpatient and consider tapering her prednisone.  #Alcoholic liver disease The patient had an elevated AST/ALT ratio along with an elevated alkaline phosphatase and INR on presentation along with an albumen of 1.0, consistent with alcoholic liver disease. She has been tested for hepatitis C and hepatitis B in the past, which was negative. Previous  abdominal ultrasound in January 2016 showed normal liver parenchymal echogenicity, but she likely has some degree of cirrhosis which contributed to her right-sided pleural effusion.  Her liver disease will likely be an ongoing limiting factor in terms of her prognosis and ability to receive a graft or fistula by vascular surgery. Prior to her tunneled dialysis catheter placement, she was given a unit of platelets and 2 units of FFP to help normalize her coagulation.   #Mixed metabolic acidosis The patient presented with a pH of 6.84, an anion gap greater than 20, and a bicarbonate of less than 5 consistent with a mixed gap and non-gap metabolic acidosis. She was noted to have significantly elevated lactic acid along with elevated betahydroxybutyric acid, suggesting her anion gap acidosis was secondary to a combination of alcoholic ketoacidosis and volume depletion. She did complain of some intermittent diarrhea prior to presentation, but her non-gap acidosis was likely secondary to her renal failure with a possible underlying RTA. She was initially started on IV sodium bicarbonate, but she developed worsening pleural effusions given her low albumin. As result, she was converted to oral sodium bicarbonate. Following dialysis, her acidosis resolved, and she no longer required bicarbonate supplementation.  #Electrolyte abnormalities The patient had severe hypomagnesemia on presentation, and she alternated between hypo- and hyperkalemia. Her electrolytes were supplemented as indicated, and they improved significantly after dialysis.  #CKD related metabolic bone disease The patient was noted to have elevated phosphorus and low calcium on presentation, likely due to CKD related metabolic bone disease. She had previously been noted to have been an elevated PTH. She was continued on her home calcium carbonate 1200 mg 3 times a day and calcitriol 0.25 g daily.  #Anemia of chronic disease The patient's hemoglobin  was at her baseline of approximately 8 on presentation. This steadily dropped to 6.3 with IV hydration, and she was transfused 1 unit of red blood cells. Given her kidney disease, she is started on Aranesp by nephrology, and her hemoglobin remained stable.  #Prolonged QTc The patient's QT interval was noted to be greater than 600 during the hospitalization. This was likely secondary due to her many metabolic abnormalities. QT prolonging medications were avoided.  #Hypertension After receiving dialysis, the patient was normotensive off of her home amlodipine and metoprolol. As result, these were discontinued at discharge.  #Alcohol abuse The patient has a history of significant alcohol abuse. She was maintained on the CIWA protocol, but she did not require any Ativan.  #Severe malnutrition Patient was very cachectic and thin on exam. Nutrition was consulted and recommended feeding supplementation, which the patient was given during the hospitalization and at discharge.  Discharge Vitals:   BP 105/69 mmHg  Pulse 88  Temp(Src) 99 F (37.2 C) (Oral)  Resp 16  Ht 5\' 1"  (1.549 m)  Wt 94 lb 9.2 oz (42.9 kg)  BMI 17.88 kg/m2  SpO2 93%  Discharge Labs:  No results found for this or any  previous visit (from the past 24 hour(s)).  Signed: Charlesetta Shanks, MD 10/29/2014, 11:42 AM    Services Ordered on Discharge: SNF. Equipment Ordered on Discharge: 3 in 1, rolling walker.

## 2014-10-19 ENCOUNTER — Ambulatory Visit (HOSPITAL_COMMUNITY): Payer: Medicare Other

## 2014-10-19 DIAGNOSIS — E43 Unspecified severe protein-calorie malnutrition: Secondary | ICD-10-CM

## 2014-10-19 DIAGNOSIS — F1010 Alcohol abuse, uncomplicated: Secondary | ICD-10-CM

## 2014-10-19 DIAGNOSIS — L299 Pruritus, unspecified: Secondary | ICD-10-CM

## 2014-10-19 DIAGNOSIS — N184 Chronic kidney disease, stage 4 (severe): Secondary | ICD-10-CM

## 2014-10-19 DIAGNOSIS — E874 Mixed disorder of acid-base balance: Secondary | ICD-10-CM

## 2014-10-19 DIAGNOSIS — M3214 Glomerular disease in systemic lupus erythematosus: Secondary | ICD-10-CM

## 2014-10-19 DIAGNOSIS — N186 End stage renal disease: Secondary | ICD-10-CM

## 2014-10-19 LAB — RENAL FUNCTION PANEL
Albumin: 1.2 g/dL — ABNORMAL LOW (ref 3.5–5.0)
Anion gap: 4 — ABNORMAL LOW (ref 5–15)
CHLORIDE: 101 mmol/L (ref 101–111)
CO2: 30 mmol/L (ref 22–32)
Calcium: 7.4 mg/dL — ABNORMAL LOW (ref 8.9–10.3)
Creatinine, Ser: 2.33 mg/dL — ABNORMAL HIGH (ref 0.44–1.00)
GFR calc Af Amer: 26 mL/min — ABNORMAL LOW (ref >60)
GFR, EST NON AFRICAN AMERICAN: 22 mL/min — AB (ref >60)
GLUCOSE: 99 mg/dL (ref 65–99)
PHOSPHORUS: 3.5 mg/dL (ref 2.5–4.6)
Potassium: 3.8 mmol/L (ref 3.5–5.1)
Sodium: 135 mmol/L (ref 135–145)

## 2014-10-19 LAB — CBC
HEMATOCRIT: 25.9 % — AB (ref 36.0–46.0)
Hemoglobin: 8.2 g/dL — ABNORMAL LOW (ref 12.0–15.0)
MCH: 29.4 pg (ref 26.0–34.0)
MCHC: 31.7 g/dL (ref 30.0–36.0)
MCV: 92.8 fL (ref 78.0–100.0)
Platelets: 42 10*3/uL — ABNORMAL LOW (ref 150–400)
RBC: 2.79 MIL/uL — AB (ref 3.87–5.11)
RDW: 19.1 % — ABNORMAL HIGH (ref 11.5–15.5)
WBC: 5.9 10*3/uL (ref 4.0–10.5)

## 2014-10-19 LAB — PROTIME-INR
INR: 1.86 — ABNORMAL HIGH (ref 0.00–1.49)
PROTHROMBIN TIME: 21.6 s — AB (ref 11.6–15.2)

## 2014-10-19 LAB — CULTURE, BLOOD (ROUTINE X 2)
Culture: NO GROWTH
Culture: NO GROWTH

## 2014-10-19 LAB — HEPATIC FUNCTION PANEL
ALT: 21 U/L (ref 14–54)
AST: 56 U/L — ABNORMAL HIGH (ref 15–41)
Albumin: 1.3 g/dL — ABNORMAL LOW (ref 3.5–5.0)
Alkaline Phosphatase: 311 U/L — ABNORMAL HIGH (ref 38–126)
Bilirubin, Direct: 0.3 mg/dL (ref 0.1–0.5)
Indirect Bilirubin: 0.5 mg/dL (ref 0.3–0.9)
TOTAL PROTEIN: 8.1 g/dL (ref 6.5–8.1)
Total Bilirubin: 0.8 mg/dL (ref 0.3–1.2)

## 2014-10-19 LAB — MAGNESIUM: MAGNESIUM: 1.5 mg/dL — AB (ref 1.7–2.4)

## 2014-10-19 MED ORDER — DARBEPOETIN ALFA 200 MCG/0.4ML IJ SOSY
200.0000 ug | PREFILLED_SYRINGE | INTRAMUSCULAR | Status: DC
  Administered 2014-10-21 – 2014-10-28 (×2): 200 ug via INTRAVENOUS
  Filled 2014-10-19 (×2): qty 0.4

## 2014-10-19 MED ORDER — MAGNESIUM SULFATE IN D5W 10-5 MG/ML-% IV SOLN
1.0000 g | Freq: Once | INTRAVENOUS | Status: AC
  Administered 2014-10-19: 1 g via INTRAVENOUS
  Filled 2014-10-19: qty 100

## 2014-10-19 MED ORDER — RENA-VITE PO TABS
1.0000 | ORAL_TABLET | Freq: Every day | ORAL | Status: DC
  Administered 2014-10-19 – 2014-10-28 (×10): 1 via ORAL
  Filled 2014-10-19 (×15): qty 1

## 2014-10-19 NOTE — Progress Notes (Signed)
[name], female   DOB: 07-23-1955, 59 y.o.   MRN: [id number] Medicine attending: We appreciate ongoing assistance from nephrology. I personally examined this patient today together with resident physician Dr. Karle Starch Moding and I concur with his evaluation and management plan. 59 year old woman with progressive renal insufficiency secondary to underlying lupus and hypertension who was started on dialysis at the time of the current admission. A temporary vascular catheter placed in the right subclavian position. Plans in progress for vein mapping and placement of a vascular graft. She has stabilized clinically. Dyspnea has resolved. Lungs are now clear. Regular cardiac rhythm without gallop. No peripheral edema. She has had symptomatic relief of poly-arthralgias on recently initiated trial of low-dose prednisone and Plaquenil.

## 2014-10-19 NOTE — Progress Notes (Signed)
Subjective:    The patient reports feeling well this morning. She does have some continued pain in her legs and feet. She denies any shortness of breath currently. She has not been making any urine.  Interval Events: -Receiving dialysis this morning.   Objective:    Vital Signs:   Temp:  [97.5 F (36.4 C)-98.2 F (36.8 C)] 97.5 F (36.4 C) (05/16 1045) Pulse Rate:  [92-102] 93 (05/16 1045) Resp:  [15-20] 15 (05/16 1045) BP: (103-153)/(79-99) 126/91 mmHg (05/16 1045) SpO2:  [94 %-100 %] 98 % (05/16 1045) Weight:  [89 lb 8.1 oz (40.6 kg)-107 lb 7.6 oz (48.75 kg)] 89 lb 8.1 oz (40.6 kg) (05/16 1045) Last BM Date: 10/18/14  24-hour weight change: Weight change: 3 lb 6.7 oz (1.55 kg)  Intake/Output:   Intake/Output Summary (Last 24 hours) at 10/19/14 1130 Last data filed at 10/19/14 1045  Gross per 24 hour  Intake    300 ml  Output   1500 ml  Net  -1200 ml      Physical Exam: General: Thin, cachectic appearing, alert, in no acute distress.  Lungs:  Clear to auscultation bilaterally, no wheezing, rales or rhonchi.   Heart: Regular rate and rhythm, S1 and S2 normal without gallop, murmur, or rubs.  Abdomen:  BS normoactive, nontender, nondistended.  Extremities: No pretibial edema, no tenderness to palpation.     Labs:  Basic Metabolic Panel:  Recent Labs Lab 10/14/14 0358 10/15/14 0314 10/16/14 0217 10/17/14 0319 10/17/14 0925 10/19/14 0654  NA 140 138 135 135 134* 135  K 3.5 3.0* 3.8 3.8 3.6 3.8  CL 107 104 102 101 101 101  CO2 19* 23 25 26 29 30   GLUCOSE 161* 97 172* 115* 188* 99  BUN 24* 24* 10 <5* <5* <5*  CREATININE 4.94* 4.79* 2.79* 1.75* 1.87* 2.33*  CALCIUM 6.0* 6.3* 6.7* 7.1* 7.1* 7.4*  MG 1.2* 2.1 1.8 1.7  --  1.5*  PHOS  --  4.7* 2.6 1.6* 1.7* 3.5    Liver Function Tests:  Recent Labs Lab 10/12/14 1932 10/13/14 0220 10/15/14 0314 10/16/14 0217 10/17/14 0319 10/17/14 0925 10/19/14 0654  AST 74* 94*  --   --   --   --   --   ALT  14 19  --   --   --   --   --   ALKPHOS 436* 379*  --   --   --   --   --   BILITOT 0.7 0.6  --   --   --   --   --   PROT 8.0 7.3  --   --   --   --   --   ALBUMIN 1.3* 1.2* 1.0* 1.1* 1.1* 1.0* 1.2*    Recent Labs Lab 10/12/14 1932  LIPASE 13*   CBC:  Recent Labs Lab 10/15/14 1630 10/16/14 0217 10/17/14 0319 10/17/14 0925 10/19/14 0654  WBC 6.4 6.5 6.8 6.6 5.9  HGB 9.1* 8.4* 8.7* 8.0* 8.2*  HCT 27.4* 25.4* 26.5* 25.0* 25.9*  MCV 86.7 87.9 90.1 90.6 92.8  PLT 37* 38* 42* 37* 42*    Cardiac Enzymes:  Recent Labs Lab 10/13/14 0220 10/13/14 1130 10/13/14 1600 10/13/14 2154 10/14/14 0358  TROPONINI 0.09* 0.10* 0.12* 0.14* 0.15*   Microbiology: Results for orders placed or performed during the hospital encounter of 10/12/14  Culture, blood (routine x 2)     Status: None   Collection Time: 10/12/14 10:04 PM  Result Value Ref  Range Status   Specimen Description BLOOD LEFT ANTECUBITAL  Final   Special Requests BOTTLES DRAWN AEROBIC AND ANAEROBIC 5CC EA  Final   Culture   Final    NO GROWTH 5 DAYS Performed at [facility]    Report Status 10/19/2014 FINAL  Final  Culture, blood (routine x 2)     Status: None   Collection Time: 10/12/14 10:16 PM  Result Value Ref Range Status   Specimen Description BLOOD LEFT WRIST  Final   Special Requests BOTTLES DRAWN AEROBIC ONLY 3CC  Final   Culture   Final    NO GROWTH 5 DAYS Note: Culture results may be compromised due to an inadequate volume of blood received in culture bottles. Performed at [facility]    Report Status 10/19/2014 FINAL  Final  MRSA PCR Screening     Status: None   Collection Time: 10/12/14 11:40 PM  Result Value Ref Range Status   MRSA by PCR NEGATIVE NEGATIVE Final    Comment:        The GeneXpert MRSA Assay (FDA approved for NASAL specimens only), is one component of a comprehensive MRSA colonization surveillance program. It is not intended to diagnose MRSA infection nor to  guide or monitor treatment for MRSA infections.   Urine culture     Status: None   Collection Time: 10/13/14  4:12 AM  Result Value Ref Range Status   Specimen Description URINE, RANDOM  Final   Special Requests NONE  Final   Colony Count   Final    >=100,000 COLONIES/ML Performed at [facility]    Culture   Final    Multiple bacterial morphotypes present, none predominant. Suggest appropriate recollection if clinically indicated. Performed at [facility]    Report Status 10/14/2014 FINAL  Final  Body fluid culture     Status: None   Collection Time: 10/13/14  1:18 PM  Result Value Ref Range Status   Specimen Description FLUID RIGHT PLEURAL  Final   Special Requests NONE  Final   Gram Stain   Final    RARE WBC PRESENT, PREDOMINANTLY MONONUCLEAR NO ORGANISMS SEEN Performed at [facility]    Culture   Final    NO GROWTH 3 DAYS Performed at [facility]    Report Status 10/17/2014 FINAL  Final  MRSA PCR Screening     Status: None   Collection Time: 10/18/14  7:45 AM  Result Value Ref Range Status   MRSA by PCR NEGATIVE NEGATIVE Final    Comment:        The GeneXpert MRSA Assay (FDA approved for NASAL specimens only), is one component of a comprehensive MRSA colonization surveillance program. It is not intended to diagnose MRSA infection nor to guide or monitor treatment for MRSA infections.    Imaging: No results found.     Medications:    Infusions: . sodium chloride 10 mL/hr at 10/13/14 0131    Scheduled Medications: . antiseptic oral rinse  7 mL Mouth Rinse BID  . calcitRIOL  0.25 mcg Oral Daily  . calcium carbonate  6 tablet Oral QHS  . [START ON 10/21/2014] darbepoetin (ARANESP) injection - DIALYSIS  200 mcg Intravenous Q Wed-HD  . FLUoxetine  10 mg Oral Daily  . hydroxychloroquine  400 mg Oral Daily  . multivitamin  1 tablet Oral QHS  . ondansetron  4 mg Intravenous Once  . predniSONE  5 mg Oral Q breakfast  .  ramelteon  8 mg Oral QHS  . vitamin B-12  250 mcg Oral QPM    PRN Medications: sodium chloride, sodium chloride, camphor-menthol, feeding supplement (NEPRO CARB STEADY), heparin, lidocaine (PF), lidocaine-prilocaine, loperamide, oxyCODONE, pentafluoroprop-tetrafluoroeth   Assessment/ Plan:    Principal Problem:   End stage renal disease Active Problems:   Alcohol abuse   FSGS (focal segmental glomerulosclerosis)   Anemia of chronic disease   Lupus (systemic lupus erythematosus)   Elevated troponin   Electrolyte abnormality   Metabolic acidosis  #Volume overload secondary to acute on chronic kidney disease She continues to tolerate dialysis well. Continues to use nasal cannula, but not necessary from a saturation standpoint. Awaiting dialysis placement and long-term access. -Dialysis today per renal. -Consulted vascular surgery for long-term dialysis access. -Check INR and LFTs prior to surgery for access. -Vein mapping ordered. -Continue CLIP process. -Patient will need to complete the hepatitis B series as an outpatient. -Oxygen for comfort. -Regular diet due to severe malnutrition.  #SLE Pain in legs and feet, but significantly improved. Some component of chronic pain. -Continue prednisone 5 mg daily. -Continue hydroxychloroquine 400 mg daily. -Patient will need an eye exam as an outpatient. -Continue oxycodone 5 mg every 4 hours when necessary.  #Itching/dry skin -Continue Sarna lotion.  #Mixed metabolic acidosis due to ESRD Bicarbonate 30 this morning. Goal 23-29. This has corrected with hemodialysis. -Stop by mouth bicarbonate.  #Electrolyte abnormalities Hypomagnesemia this morning. Potassium stable. -1 g magnesium sulfate IV. -Recheck magnesium and renal function panel tomorrow.  #CKD related metabolic bone disease -Continue home calcium carbonate 1200 mg 3 times a day. -Continue home calcitriol 0.25 g daily.  #Anemia of chronic disease Hemoglobin  stable. -Continue Aranesp per nephrology. -Continue to monitor hemoglobin.  #Prolonged QTc -Avoid QT prolonging medications.  #Hypertension Normotensive. -Continue to hold home amlodipine and metoprolol.  #Alcohol abuse No signs of withdrawal currently. -CIWA protocol.  #Depression -Continue home fluoxetine 10 mg daily.  #Severe malnutrition -Continue feeding supplement per nutrition recommendations. -Continue thiamine, folate, B-12.   DVT PPX - SCD's while in bed  CODE STATUS - Full.  CONSULTS PLACED - nephrology.  DISPO - Disposition is deferred at this time, awaiting improvement of dyspnea.   Anticipated discharge in approximately 2-4 day(s).   The patient does have a current PCP Eulas Post, Brett Canales, MD) and does need an [facility] hospital follow-up appointment after discharge.    Is the [facility] hospital follow-up appointment a one-time only appointment? no.  Does the patient have transportation limitations that hinder transportation to clinic appointments? yes   SERVICE NEEDED AT [place]         Y = Yes, Blank = No PT: SNF  OT: SNF  RN:   Equipment:   Other:      Length of Stay: 7 day(s)   Signed: Charlesetta Shanks, MD  PGY-1, Internal Medicine Resident Pager: [phone] (7AM-5PM) 10/19/2014, 11:30 AM

## 2014-10-19 NOTE — Consult Note (Signed)
Hospital Consult    Reason for Consult:  In need of permanent HD access and catheter Referring Physician:  Deterding MRN #:  [id number]  History of Present Illness: This is a 59 y.o. female who was admitted to the hospital 10/12/14 with worsening shortness of breath and unable to take medications due to vomiting.  She has hx of SLE, but was not on medications for this.  Medications have since been started. She has chronic combined CHF.  She had a pericardial window in 2015 for pericarditis.  For a couple of weeks prior to admission, she did require 4LO2NC following a bout with HCAP.   She has since underwent a thoracentesis.   She is on CIWA protocol for ETOH abuse.  She also has CKD IV that is managed by Dr. Justin Mend. She has had a bx in the past that was mostly scarring and not felt to have a reversible component of her kidney process.  At this time, she remains oliguric and nephrology wants to proceed with permanent access.  Past Medical History  Diagnosis Date  . Anemia, B12 deficiency   . History of acute pancreatitis   . Right knee pain     No recent imaging on chart  . 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 Dr. Mancel Bale, [facility]  . Hypertriglyceridemia   . GERD (gastroesophageal reflux disease)   . Subdural hematoma 02/2008    Likely 2/2 trauma from seizure from EtOH withdrawal, chronic in nature, sees Dr. Jerene Bears. Most recent CT head 10/2009 showing stable but persistent hematoma without mass effect.  . History of seizure disorder     Likely 2/2 alcohol abuse  . Hypocalcemia   . Hypomagnesemia   . Failure to thrive in childhood     Unclear etiology  . HTN (hypertension)   . Thrombocytopenia   . Hepatomegaly     On exam  . Joint pain   . Alcohol abuse   . Vitamin D deficiency   . Pancreatitis   . Insomnia   . Hyperlipidemia   . Pernicious anemia   . Macrocytic anemia   . Tuberculosis     AS CHILD MED TX   . Depression   . Fx humeral neck 04/17/2011    Transverse fracture- minimally displaced- managed as outpatient   . ABNORMAL PAP SMEAR, LGSIL 07/23/2008    Annotation: HPV positive CIN I Dr. Mancel Bale, [facility] Qualifier: Diagnosis of  By: Oretha Ellis    . Pneumonia 05/20/2012  . Arthritis     "shoulders" (08/15/2013)  . 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  Marland Kitchen Chronic diastolic CHF (congestive heart failure)   . Hypomagnesemia   . Seizures     "don't know when/why I had them; daughter was always there w/me"  . On home oxygen therapy     "3L; mostly at night" (06/19/2014)  . Shortness of breath dyspnea     Past Surgical History  Procedure Laterality Date  . Cesarean section  1983  . Esophagogastroduodenoscopy  07/11/2011    Procedure: ESOPHAGOGASTRODUODENOSCOPY (EGD);  Surgeon: Beryle Beams, MD;  Location: [facility];  Service: Endoscopy;  Laterality: N/A;  . Colonoscopy  07/11/2011    Procedure: COLONOSCOPY;  Surgeon: Beryle Beams, MD;  Location: [facility] ENDOSCOPY;  Service: Endoscopy;  Laterality: N/A;  . Eye surgery Left     "trauma"  .  Right colectomy  08/28/2011  . Esophagogastroduodenoscopy N/A 12/01/2012    Procedure: ESOPHAGOGASTRODUODENOSCOPY (EGD);  Surgeon: Irene Shipper, MD;  Location: [facility];  Service: Endoscopy;  Laterality: N/A;  . Colonoscopy with esophagogastroduodenoscopy (egd) Left 08/21/2013    Procedure: COLONOSCOPY WITH ESOPHAGOGASTRODUODENOSCOPY (EGD);  Surgeon: Beryle Beams, MD;  Location: [facility] ENDOSCOPY;  Service: Endoscopy;  Laterality: Left;  . Subxyphoid pericardial window N/A 12/04/2013    Procedure: SUBXYPHOID PERICARDIAL WINDOW WITH TEE;  Surgeon: Ivin Poot, MD;  Location: [place];  Service: Thoracic;  Laterality: N/A;  . Intraoperative transesophageal echocardiogram N/A 12/04/2013    Procedure: INTRAOPERATIVE TRANSESOPHAGEAL ECHOCARDIOGRAM;  Surgeon: Ivin Poot, MD;  Location: [place];   Service: Open Heart Surgery;  Laterality: N/A;    Allergies  Allergen Reactions  . Amitriptyline Hcl Swelling    In the face.  . Doxycycline Hyclate Itching    Feels like something crawling under her skin    Prior to Admission medications   Medication Sig Start Date End Date Taking? Authorizing Provider  albuterol (PROAIR HFA) 108 (90 BASE) MCG/ACT inhaler Inhale 1-2 puffs into the lungs every 6 (six) hours as needed for wheezing or shortness of breath. 01/14/14  Yes Jones Bales, MD  amLODipine (NORVASC) 5 MG tablet Take 1 tablet (5 mg total) by mouth daily. 01/23/14 01/23/15 Yes Otho Bellows, MD  calcitRIOL (ROCALTROL) 0.25 MCG capsule Take 0.25 mcg by mouth daily. 05/04/14  Yes Historical Provider, MD  calcium carbonate (TUMS) 500 MG chewable tablet Chew 6 tablets (1,200 mg of elemental calcium total) by mouth 3 (three) times daily. For bone health 11/11/12  Yes Encarnacion Slates, NP  cetirizine (ZYRTEC) 10 MG tablet Take 1 tablet (10 mg total) by mouth daily as needed for allergies. 09/20/14  Yes Otho Bellows, MD  CREON 12000 UNITS CPEP capsule Take 12,000 Units by mouth 3 (three) times daily before meals.  09/29/14  Yes Historical Provider, MD  diclofenac sodium (VOLTAREN) 1 % GEL Apply 4 g topically daily as needed (pain).   Yes Historical Provider, MD  FLUoxetine (PROZAC) 10 MG capsule Take 1 capsule (10 mg total) by mouth daily. For depression 03/26/14  Yes Otho Bellows, MD  folic acid (FOLVITE) 1 MG tablet Take 1 tablet (1 mg total) by mouth daily. For folic acid replacement 09/20/14  Yes Otho Bellows, MD  furosemide (LASIX) 40 MG tablet Take 0.5 tablets (20 mg total) by mouth daily. 01/28/14  Yes Otho Bellows, MD  lactobacillus acidophilus (BACID) TABS tablet Take 2 tablets by mouth 3 (three) times daily. 09/23/14  Yes Luan Moore, MD  metoprolol tartrate (LOPRESSOR) 25 MG tablet Take 12.5 mg by mouth 2 (two) times daily.   Yes Historical Provider, MD  Multiple Vitamin  (MULTIVITAMIN WITH MINERALS) TABS tablet Take 1 tablet by mouth daily. For vitamin replacement   Yes Otho Bellows, MD  omeprazole (PRILOSEC) 40 MG capsule Take 1 capsule (40 mg total) by mouth daily. 03/26/14  Yes Otho Bellows, MD  thiamine (VITAMIN B-1) 100 MG tablet Take 1 tablet (100 mg total) by mouth daily. For low thiamine 09/22/14  Yes Otho Bellows, MD  vitamin B-12 (CYANOCOBALAMIN) 250 MCG tablet Take 1 tablet (250 mcg total) by mouth every evening. 04/10/13  Yes Otho Bellows, MD  feeding supplement, ENSURE COMPLETE, (ENSURE COMPLETE) LIQD Take 237 mLs by mouth 3 (three) times daily between meals. 08/27/13   Dorian Heckle, MD  gabapentin (NEURONTIN) 300 MG  capsule Take 2 capsules (600 mg total) by mouth 3 (three) times daily. For anxiety/pain control Patient not taking: Reported on 10/13/2014 01/29/14   Aldine Contes, MD  guaiFENesin-dextromethorphan (ROBITUSSIN DM) 100-10 MG/5ML syrup Take 5 mLs by mouth every 4 (four) hours as needed for cough. Patient not taking: Reported on 10/13/2014 05/06/14   Otho Bellows, MD  loperamide (IMODIUM) 2 MG capsule Take 1 capsule (2 mg total) by mouth as needed for diarrhea or loose stools. 09/23/14   Luan Moore, MD  magnesium oxide (MAG-OX) 400 MG tablet Take 1 tablet (400 mg total) by mouth 4 (four) times daily. 10/09/14   Milagros Loll, MD  sodium bicarbonate 650 MG tablet Take 2 tablets (1,300 mg total) by mouth 2 (two) times daily. Patient not taking: Reported on 10/13/2014 10/09/14   Milagros Loll, MD    History   Social History  . Marital Status: Divorced    Spouse Name: N/A  . Number of Children: N/A  . Years of Education: N/A   Occupational History  . Not on file.   Social History Main Topics  . Smoking status: Former Smoker -- 0.50 packs/day for 40 years    Types: Cigarettes    Quit date: 09/20/2010  . Smokeless tobacco: Never Used  . Alcohol Use: Yes     Comment: 06/19/2014 "graduated from [employer] (for  alcohol abuse) in October 2015"   . Drug Use: Yes    Special: Marijuana, Cocaine     Comment: 06/19/2014 "last drug use was in 2012"  . Sexual Activity: Not Currently   Other Topics Concern  . Not on file   Social History Narrative   Lives with her significant other and 2 grandchildren. 1 child   Has 7 brothers and 4 sisters, 1 twin sister.   Unemployed, worked in [employer].    Abuses alcohol-drinks 1 glass of wine daily    No drug use. Former cigarette use quit 1.5 years ago.     11 th grade education              Family History  Problem Relation Age of Onset  . Cancer Mother     Died from stomach cancer and "flesh eating rash  . Heart failure Father     Died in 43s from an MI  . Alcohol abuse Sister     Twin sister drinks a lot, as did both her parents and brothers  . Stroke Brother     Has 7 brothers, 1 with CVA  . Lupus Mother     ROS: [x]  Positive   [ ]  Negative   [ ]  All sytems reviewed and are negative  Cardiovascular: []  chest pain/pressure []  palpitations [x]  SOB lying flat [x]  DOE []  pain in legs while walking []  pain in legs at rest []  pain in legs at night []  non-healing ulcers []  hx of DVT [x]  swelling in legs  Pulmonary: []  productive cough []  asthma/wheezing [x]  home O2  Neurologic: []  weakness in []  arms []  legs []  numbness in []  arms []  legs []  hx of CVA []  mini stroke [] difficulty speaking or slurred speech []  temporary loss of vision in one eye []  dizziness  Hematologic: []  hx of cancer []  bleeding problems []  problems with blood clotting easily  Endocrine:   []  diabetes []  thyroid disease  GI []  vomiting blood []  blood in stool  GU: [x]  CKD/renal failure [x]  HD--[]  M/W/F or []  T/T/S []  burning with  urination []  blood in urine  Psychiatric: []  anxiety [x]  depression  Musculoskeletal: []  arthritis [x]  joint pain  Integumentary: []  rashes []  ulcers  Constitutional: []  fever []  chills   Physical  Examination  Filed Vitals:   10/19/14 0930  BP: 120/91  Pulse: 97  Temp:   Resp:    Body mass index is 17.67 kg/(m^2).  General:  Cachectic appearing on HD in NAD Gait: Not observed HENT: WNL, normocephalic Pulmonary: normal non-labored breathing, without Rales, rhonchi,  wheezing Cardiac: tachycardic Abdomen: soft, NT/ND, no masses Skin: without rashes, without ulcers  Vascular Exam/Pulses:  Right Left  Radial 2+ (normal) 2+ (normal)  Brachial 2+ (normal) 2+ (normal)  DP absent 2+ (normal)  PT absent 2+ (normal)   Extremities: without ischemic changes, without Gangrene , without cellulitis; without open wounds;   Neurologic: A&O X 3; Appropriate Affect ; SENSATION: normal; MOTOR FUNCTION:  moving all extremities equally. Speech is fluent/normal   CBC    Component Value Date/Time   WBC 5.9 10/19/2014 0654   WBC 7.2 10/26/2011 1336   RBC 2.79* 10/19/2014 0654   RBC 2.54* 06/18/2014 1620   RBC 2.98* 10/26/2011 1336   HGB 8.2* 10/19/2014 0654   HGB 9.7* 10/26/2011 1336   HCT 25.9* 10/19/2014 0654   HCT 28.8* 10/26/2011 1336   PLT 42* 10/19/2014 0654   PLT 125* 10/26/2011 1336   MCV 92.8 10/19/2014 0654   MCV 96.6 10/26/2011 1336   MCH 29.4 10/19/2014 0654   MCH 32.6 10/26/2011 1336   MCHC 31.7 10/19/2014 0654   MCHC 33.7 10/26/2011 1336   RDW 19.1* 10/19/2014 0654   RDW 13.6 10/26/2011 1336   LYMPHSABS 1.8 09/30/2014 1013   LYMPHSABS 2.7 10/26/2011 1336   MONOABS 0.6 09/30/2014 1013   MONOABS 0.8 10/26/2011 1336   EOSABS 0.0 09/30/2014 1013   EOSABS 0.1 10/26/2011 1336   BASOSABS 0.2* 09/30/2014 1013   BASOSABS 0.0 10/26/2011 1336    BMET    Component Value Date/Time   NA 135 10/19/2014 0654   K 3.8 10/19/2014 0654   CL 101 10/19/2014 0654   CO2 30 10/19/2014 0654   GLUCOSE 99 10/19/2014 0654   BUN <5* 10/19/2014 0654   CREATININE 2.33* 10/19/2014 0654   CREATININE 4.40* 10/08/2014 1018   CALCIUM 7.4* 10/19/2014 0654   CALCIUM 4.0* 09/17/2014 2048    GFRNONAA 22* 10/19/2014 0654   GFRNONAA 14* 09/30/2014 1013   GFRAA 26* 10/19/2014 0654   GFRAA 16* 09/30/2014 1013    COAGS: Lab Results  Component Value Date   INR 2.13* 10/13/2014   INR 1.63* 09/20/2014   INR 1.59* 09/19/2014     Non-Invasive Vascular Imaging:   Vein mapping ordered today 10/19/14  Statin:  No. Beta Blocker:  Yes.   Aspirin:  No. ACEI:  No. ARB:  No. Other antiplatelets/anticoagulants:  No.    ASSESSMENT/PLAN: This is a 59 y.o. female with acute on CKD IV in need of permanent HD access   -will order BUE vein mapping -pt INR was elevated on 10/13/14 and not on anticoagulation per home medications and needs work up prior to surgery for access if still elevated.  Will recheck INR and LFT's.  She does have hx of ETOH abuse and has been on CIWA protocol.  She had elevated alk phos and AST on admission. -malnutrition with albumin 1.2   Leontine Locket, PA-C [facility] [phone]  History and exam findings as above.  Pt is right handed.  Vein  mapping pending.  Severe protein calorie malnutrition and coagulapathy would like this improved somewhat before placing permanent access.  Have ordered repeat LFT and repeat LFT but may need further workup by primary service.  Will follow up on vein mapping and make decision on access in the next few days.  Ruta Hinds, MD [facility] [place] Office: [phone] Pager: [phone]

## 2014-10-19 NOTE — Progress Notes (Signed)
Subjective: Interval History: has no complaint,not making urine.  Objective: Vital signs in last 24 hours: Temp:  [98 F (36.7 C)-98.3 F (36.8 C)] 98.1 F (36.7 C) (05/16 0635) Pulse Rate:  [93-102] 102 (05/16 0830) Resp:  [16-20] 16 (05/16 0635) BP: (103-134)/(79-99) 126/94 mmHg (05/16 0830) SpO2:  [94 %-100 %] 97 % (05/16 0635) Weight:  [42.4 kg (93 lb 7.6 oz)-48.75 kg (107 lb 7.6 oz)] 42.4 kg (93 lb 7.6 oz) (05/16 0635) Weight change: 1.55 kg (3 lb 6.7 oz)  Intake/Output from previous day: 05/15 0701 - 05/16 0700 In: 300 [P.O.:300] Out: -  Intake/Output this shift:    General appearance: alert, cooperative and chronically ill Neck: RIJ cath Resp: diminished breath sounds bilaterally and rales bibasilar Cardio: S1, S2 normal and systolic murmur: holosystolic 2/6, blowing at apex GI: soft,striae,pos bs, Extremities: extremities normal, atraumatic, no cyanosis or edema  Lab Results:  Recent Labs  10/17/14 0925 10/19/14 0654  WBC 6.6 5.9  HGB 8.0* 8.2*  HCT 25.0* 25.9*  PLT 37* 42*   BMET:  Recent Labs  10/17/14 0925 10/19/14 0654  NA 134* 135  K 3.6 3.8  CL 101 101  CO2 29 30  GLUCOSE 188* 99  BUN <5* <5*  CREATININE 1.87* 2.33*  CALCIUM 7.1* 7.4*   No results for input(s): PTH in the last 72 hours. Iron Studies: No results for input(s): IRON, TIBC, TRANSFERRIN, FERRITIN in the last 72 hours.  Studies/Results: No results found.  I have reviewed the patient's current medications.  Assessment/Plan: 1  CKD 4 AKI.  Oliguric, limited ms mass so Cr not high.  Will make plans for chronic HD.  If has some recovery, will monitor.   2 SLE  3 Malnutrition suppl 4 Anemia use iv Aranesp and Fe 5 EtOH abuse 6 HPTH Vit D P HD, iv epo, get PC and perm access.    LOS: 7 days   Yobani Schertzer L 10/19/2014,8:43 AM

## 2014-10-20 ENCOUNTER — Inpatient Hospital Stay (HOSPITAL_COMMUNITY): Payer: Medicare Other

## 2014-10-20 ENCOUNTER — Encounter: Payer: Self-pay | Admitting: *Deleted

## 2014-10-20 DIAGNOSIS — D689 Coagulation defect, unspecified: Secondary | ICD-10-CM | POA: Insufficient documentation

## 2014-10-20 DIAGNOSIS — K7030 Alcoholic cirrhosis of liver without ascites: Secondary | ICD-10-CM

## 2014-10-20 DIAGNOSIS — D684 Acquired coagulation factor deficiency: Secondary | ICD-10-CM

## 2014-10-20 DIAGNOSIS — M7989 Other specified soft tissue disorders: Secondary | ICD-10-CM

## 2014-10-20 LAB — CBC
HEMATOCRIT: 29.6 % — AB (ref 36.0–46.0)
Hemoglobin: 9.4 g/dL — ABNORMAL LOW (ref 12.0–15.0)
MCH: 29.7 pg (ref 26.0–34.0)
MCHC: 31.8 g/dL (ref 30.0–36.0)
MCV: 93.4 fL (ref 78.0–100.0)
PLATELETS: 46 10*3/uL — AB (ref 150–400)
RBC: 3.17 MIL/uL — ABNORMAL LOW (ref 3.87–5.11)
RDW: 19.3 % — AB (ref 11.5–15.5)
WBC: 5.9 10*3/uL (ref 4.0–10.5)

## 2014-10-20 LAB — IRON AND TIBC
IRON: 120 ug/dL (ref 28–170)
Saturation Ratios: 84 % — ABNORMAL HIGH (ref 10.4–31.8)
TIBC: 143 ug/dL — ABNORMAL LOW (ref 250–450)
UIBC: 23 ug/dL

## 2014-10-20 LAB — RENAL FUNCTION PANEL
Albumin: 1.2 g/dL — ABNORMAL LOW (ref 3.5–5.0)
Anion gap: 7 (ref 5–15)
BUN: <5 mg/dL — ABNORMAL LOW (ref 6–20)
CO2: 25 mmol/L (ref 22–32)
Calcium: 7.6 mg/dL — ABNORMAL LOW (ref 8.9–10.3)
Chloride: 103 mmol/L (ref 101–111)
Creatinine, Ser: 1.43 mg/dL — ABNORMAL HIGH (ref 0.44–1.00)
GFR calc Af Amer: 46 mL/min — ABNORMAL LOW
GFR calc non Af Amer: 40 mL/min — ABNORMAL LOW
Glucose, Bld: 92 mg/dL (ref 65–99)
Phosphorus: 2.6 mg/dL (ref 2.5–4.6)
Potassium: 4.1 mmol/L (ref 3.5–5.1)
Sodium: 135 mmol/L (ref 135–145)

## 2014-10-20 LAB — FERRITIN: Ferritin: 1356 ng/mL — ABNORMAL HIGH (ref 11–307)

## 2014-10-20 LAB — MAGNESIUM: Magnesium: 2 mg/dL (ref 1.7–2.4)

## 2014-10-20 MED ORDER — NEPRO/CARBSTEADY PO LIQD
237.0000 mL | Freq: Three times a day (TID) | ORAL | Status: DC
  Administered 2014-10-21 – 2014-10-29 (×8): 237 mL via ORAL

## 2014-10-20 NOTE — Progress Notes (Signed)
VASCULAR LAB PRELIMINARY  PRELIMINARY  PRELIMINARY  PRELIMINARY  Right  Upper Extremity Vein Map    Cephalic  Segment Diameter Depth Comment  1. Axilla 1.66mm mm   2. Mid upper arm 1.76mm mm   3. Above AC mm mm Too small to measure  4. In AC mm mm   5. Below AC mm mm Thrombosed in the forearm  6. Mid forearm mm mm   7. Wrist mm mm    mm mm    mm mm    mm mm    Basilic  Segment Diameter Depth Comment  1. Axilla mm mm   2. Mid upper arm 2.72mm 6.79mm   3. Above AC 1.58mm 5.51mm   4. In [facility] 1.81mm 31mm   5. Below AC mm mm   6. Mid forearm mm mm   7. Wrist mm mm    mm mm    mm mm    mm mm    Left Upper Extremity Vein Map    Cephalic  Segment Diameter Depth Comment  1. Axilla mm mm   2. Mid upper arm mm mm Not visualized throughout  3. Above AC mm mm   4. In AC mm mm   5. Below AC mm mm   6. Mid forearm mm mm   7. Wrist mm mm    mm mm    mm mm    mm mm    Basilic  Segment Diameter Depth Comment  1. Axilla mm mm   2. Mid upper arm 2.79mm 5.98mm   3. Above [facility] 1.70mm 4.25mm   4. In AC mm mm Not visualized through the forearm  5. Below AC mm mm   6. Mid forearm mm mm   7. Wrist mm mm    mm mm    mm mm    mm mm     Taitum Alms, RVT 10/20/2014, 11:49 AM

## 2014-10-20 NOTE — Telephone Encounter (Signed)
Pt was hospitalized prior to [facility] RN assessment.  This order closed and new order based on needs upon discharge.

## 2014-10-20 NOTE — Progress Notes (Signed)
Subjective:    The patient reports feeling great this morning. She reports being a little tired after dialysis yesterday. She denies any shortness of breath or pain currently.  Interval Events: -Received dialysis yesterday with 1.5 L off. -Seen by vascular surgery who is concerned about her severe calorie malnutrition, thrombocytopenia, coagulopathy, and elevated LFTs that are secondary to her alcoholic liver disease (this is a long-term problem for her).    Objective:    Vital Signs:   Temp:  [97.5 F (36.4 C)-97.9 F (36.6 C)] 97.9 F (36.6 C) (05/17 0449) Pulse Rate:  [89-102] 89 (05/17 0449) Resp:  [15-18] 16 (05/17 0449) BP: (111-153)/(78-94) 115/85 mmHg (05/17 0449) SpO2:  [98 %-100 %] 100 % (05/17 0449) Weight:  [89 lb 8.1 oz (40.6 kg)-100 lb 14.4 oz (45.768 kg)] 100 lb 14.4 oz (45.768 kg) (05/16 2042) Last BM Date: 10/18/14  24-hour weight change: Weight change: -17 lb 15.5 oz (-8.15 kg)  Intake/Output:   Intake/Output Summary (Last 24 hours) at 10/20/14 0819 Last data filed at 10/19/14 1800  Gross per 24 hour  Intake    240 ml  Output   1525 ml  Net  -1285 ml      Physical Exam: General: Thin, cachectic appearing, alert, in no acute distress.  Lungs:  Clear to auscultation bilaterally, no wheezing, rales or rhonchi.   Heart: Regular rate and rhythm, S1 and S2 normal without gallop, murmur, or rubs.  Abdomen:  BS normoactive, nontender, nondistended.  Extremities: No pretibial edema, no tenderness to palpation.     Labs:  Basic Metabolic Panel:  Recent Labs Lab 10/15/14 0314 10/16/14 0217 10/17/14 0319 10/17/14 0925 10/19/14 0654 10/20/14 0353  NA 138 135 135 134* 135 135  K 3.0* 3.8 3.8 3.6 3.8 4.1  CL 104 102 101 101 101 103  CO2 23 25 26 29 30 25   GLUCOSE 97 172* 115* 188* 99 92  BUN 24* 10 <5* <5* <5* <5*  CREATININE 4.79* 2.79* 1.75* 1.87* 2.33* 1.43*  CALCIUM 6.3* 6.7* 7.1* 7.1* 7.4* 7.6*  MG 2.1 1.8 1.7  --  1.5* 2.0  PHOS 4.7* 2.6  1.6* 1.7* 3.5 2.6    Liver Function Tests:  Recent Labs Lab 10/17/14 0319 10/17/14 0925 10/19/14 0654 10/19/14 1558 10/20/14 0353  AST  --   --   --  56*  --   ALT  --   --   --  21  --   ALKPHOS  --   --   --  311*  --   BILITOT  --   --   --  0.8  --   PROT  --   --   --  8.1  --   ALBUMIN 1.1* 1.0* 1.2* 1.3* 1.2*   No results for input(s): LIPASE, AMYLASE in the last 168 hours. CBC:  Recent Labs Lab 10/16/14 0217 10/17/14 0319 10/17/14 0925 10/19/14 0654 10/20/14 0353  WBC 6.5 6.8 6.6 5.9 5.9  HGB 8.4* 8.7* 8.0* 8.2* 9.4*  HCT 25.4* 26.5* 25.0* 25.9* 29.6*  MCV 87.9 90.1 90.6 92.8 93.4  PLT 38* 42* 37* 42* 46*    Cardiac Enzymes:  Recent Labs Lab 10/13/14 1130 10/13/14 1600 10/13/14 2154 10/14/14 0358  TROPONINI 0.10* 0.12* 0.14* 0.15*   Iron/TIBC/Ferritin/ %Sat    Component Value Date/Time   IRON 120 10/20/2014 0353   TIBC 143* 10/20/2014 0353   FERRITIN 1356* 10/20/2014 0353   IRONPCTSAT 84* 10/20/2014 0353   IRONPCTSAT NOT CALC 06/18/2014  1620   Microbiology: Results for orders placed or performed during the hospital encounter of 10/12/14  Culture, blood (routine x 2)     Status: None   Collection Time: 10/12/14 10:04 PM  Result Value Ref Range Status   Specimen Description BLOOD LEFT ANTECUBITAL  Final   Special Requests BOTTLES DRAWN AEROBIC AND ANAEROBIC 5CC EA  Final   Culture   Final    NO GROWTH 5 DAYS Performed at [facility]    Report Status 10/19/2014 FINAL  Final  Culture, blood (routine x 2)     Status: None   Collection Time: 10/12/14 10:16 PM  Result Value Ref Range Status   Specimen Description BLOOD LEFT WRIST  Final   Special Requests BOTTLES DRAWN AEROBIC ONLY 3CC  Final   Culture   Final    NO GROWTH 5 DAYS Note: Culture results may be compromised due to an inadequate volume of blood received in culture bottles. Performed at [facility]    Report Status 10/19/2014 FINAL  Final  MRSA PCR Screening      Status: None   Collection Time: 10/12/14 11:40 PM  Result Value Ref Range Status   MRSA by PCR NEGATIVE NEGATIVE Final    Comment:        The GeneXpert MRSA Assay (FDA approved for NASAL specimens only), is one component of a comprehensive MRSA colonization surveillance program. It is not intended to diagnose MRSA infection nor to guide or monitor treatment for MRSA infections.   Urine culture     Status: None   Collection Time: 10/13/14  4:12 AM  Result Value Ref Range Status   Specimen Description URINE, RANDOM  Final   Special Requests NONE  Final   Colony Count   Final    >=100,000 COLONIES/ML Performed at [facility]    Culture   Final    Multiple bacterial morphotypes present, none predominant. Suggest appropriate recollection if clinically indicated. Performed at [facility]    Report Status 10/14/2014 FINAL  Final  Body fluid culture     Status: None   Collection Time: 10/13/14  1:18 PM  Result Value Ref Range Status   Specimen Description FLUID RIGHT PLEURAL  Final   Special Requests NONE  Final   Gram Stain   Final    RARE WBC PRESENT, PREDOMINANTLY MONONUCLEAR NO ORGANISMS SEEN Performed at [facility]    Culture   Final    NO GROWTH 3 DAYS Performed at [facility]    Report Status 10/17/2014 FINAL  Final  MRSA PCR Screening     Status: None   Collection Time: 10/18/14  7:45 AM  Result Value Ref Range Status   MRSA by PCR NEGATIVE NEGATIVE Final    Comment:        The GeneXpert MRSA Assay (FDA approved for NASAL specimens only), is one component of a comprehensive MRSA colonization surveillance program. It is not intended to diagnose MRSA infection nor to guide or monitor treatment for MRSA infections.    Imaging: No results found.     Medications:    Infusions: . sodium chloride 10 mL/hr at 10/13/14 0131    Scheduled Medications: . antiseptic oral rinse  7 mL Mouth Rinse BID  . calcitRIOL  0.25  mcg Oral Daily  . calcium carbonate  6 tablet Oral QHS  . [START ON 10/21/2014] darbepoetin (ARANESP) injection - DIALYSIS  200 mcg Intravenous Q Wed-HD  . FLUoxetine  10  mg Oral Daily  . hydroxychloroquine  400 mg Oral Daily  . multivitamin  1 tablet Oral QHS  . ondansetron  4 mg Intravenous Once  . predniSONE  5 mg Oral Q breakfast  . ramelteon  8 mg Oral QHS  . vitamin B-12  250 mcg Oral QPM    PRN Medications: sodium chloride, sodium chloride, camphor-menthol, feeding supplement (NEPRO CARB STEADY), heparin, lidocaine (PF), lidocaine-prilocaine, loperamide, oxyCODONE, pentafluoroprop-tetrafluoroeth   Assessment/ Plan:    Principal Problem:   End stage renal disease Active Problems:   Alcohol abuse   FSGS (focal segmental glomerulosclerosis)   Anemia of chronic disease   Lupus (systemic lupus erythematosus)   Elevated troponin   Electrolyte abnormality   Metabolic acidosis  #Volume overload secondary to ESRD The patient continues to do well on dialysis. Awaiting long-term dialysis access per vascular surgery. They concerned about her chronic medical problems, but there is no need for further workup from our standpoint.  Can give FFP and platelets as below prior to surgery. -Next dialysis session per renal. -Long-term dialysis access per vascular surgery. -Vein mapping ordered. -Continue CLIP process. -Patient will need to complete the hepatitis B series as an outpatient. -Oxygen for comfort. -Regular diet due to severe malnutrition.  #Alcohol liver disease Elevated AST/ALT ratio along with elevated alkaline phosphatase and INR is classic for alcoholic liver disease. Tested for hepatitis C and hepatitis B earlier this year, negative. Albumin will not improve during this hospitalization as this is a poor indicator of acute nutrition status.  Alcohol likely causing marrow suppression as well leading to thrombocytopenia. -No further workup. -Would recommend giving platelets  and two units of FFP prior to surgery.  #SLE Denies pain this morning. -Continue prednisone 5 mg daily. -Continue hydroxychloroquine 400 mg daily. -Patient will need an eye exam as an outpatient. -Continue oxycodone 5 mg every 4 hours when necessary.  #Itching/dry skin -Continue Sarna lotion.  #Mixed metabolic acidosis due to ESRD Resolved. -Continue to monitor to determine if she will need oral bicarbonate as an outpatient.  #Electrolyte abnormalities Improved this morning. -Continue to monitor.  #CKD related metabolic bone disease -Continue home calcium carbonate 1200 mg 3 times a day. -Continue home calcitriol 0.25 g daily.  #Anemia of chronic disease Renal rechecked anemia studies, consistent with anemia of chronic disease same as earlier this year. -Continue Aranesp per nephrology. -Continue to monitor hemoglobin.  #Prolonged QTc -Avoid QT prolonging medications.  #Hypertension Normotensive. -Continue to hold home amlodipine and metoprolol-may not need on discharge.  #Alcohol abuse -CIWA protocol.  #Depression -Continue home fluoxetine 10 mg daily.  #Severe malnutrition -Continue feeding supplement per nutrition recommendations. -Continue thiamine, folate, B-12.   DVT PPX - SCD's while in bed  CODE STATUS - Full.  CONSULTS PLACED - Nephrology.  DISPO - Disposition is deferred at this time, awaiting improvement of dyspnea.   Anticipated discharge in approximately 2-4 day(s).   The patient does have a current PCP Eulas Post, Brett Canales, MD) and does need an [facility] hospital follow-up appointment after discharge.    Is the [facility] hospital follow-up appointment a one-time only appointment? no.  Does the patient have transportation limitations that hinder transportation to clinic appointments? yes   SERVICE NEEDED AT [facility]         Y = Yes, Blank = No PT: SNF  OT: SNF  RN:   Equipment:   Other:      Length of  Stay: 8 day(s)  Signed: Charlesetta Shanks, MD  PGY-1, Internal Medicine Resident Pager: [phone] (7AM-5PM) 10/20/2014, 8:19 AM

## 2014-10-20 NOTE — Progress Notes (Signed)
Occupational Therapy Treatment Patient Details Name: [name] [name] MRN: [id number] DOB: 16-Jun-1955 Today's Date: 10/20/2014    History of present illness Pt admitted with dyspnea and inability to take medications due to vomiting.  Pt also reports diarrhea. Now s/p thoracentesis and placement of central venous HD catheter. Pt discharged last week with HCAP. PMH: CKD, CHF, Lupus, pericardial window, ETOH window, HTN, depression.   OT comments  Pt fatigued, awakened for therapy and still willing to perform ADL and ADL transfers with OT.  Progress noted in LB dressing, transfers and standing balance at sink for ADL. Will continue to follow.  Follow Up Recommendations  SNF    Equipment Recommendations       Recommendations for Other Services      Precautions / Restrictions Precautions Precautions: Fall       Mobility Bed Mobility Overal bed mobility: Needs Assistance Bed Mobility: Supine to Sit;Sit to Supine     Supine to sit: Supervision Sit to supine: Supervision   General bed mobility comments: Used bed rails; HOB elevated  Transfers Overall transfer level: Needs assistance Equipment used: 1 person hand held assist Transfers: Sit to/from Stand Sit to Stand: Min guard              Balance             Standing balance-Leahy Scale: Fair                     ADL Overall ADL's : Needs assistance/impaired     Grooming: Wash/dry hands;Standing;Min guard               Lower Body Dressing: Minimal assistance;Sit to/from stand Lower Body Dressing Details (indicate cue type and reason): able to donn socks with set up  Toilet Transfer: Min guard;Ambulation;BSC (BSC over toilet)   Toileting- Clothing Manipulation and Hygiene: Min guard;Sit to/from stand       Functional mobility during ADLs: Minimal assistance (hand held assist) General ADL Comments: Pt applied lotion to UEs and LEs, assisted with back.      Vision                      Perception     Praxis      Cognition   Behavior During Therapy: WFL for tasks assessed/performed Overall Cognitive Status: Within Functional Limits for tasks assessed                       Extremity/Trunk Assessment               Exercises     Shoulder Instructions       General Comments  Pt on 2L 02    Pertinent Vitals/ Pain       Pain Assessment: No/denies pain  Home Living                                          Prior Functioning/Environment              Frequency Min 2X/week     Progress Toward Goals  OT Goals(current goals can now be found in the care plan section)  Progress towards OT goals: Progressing toward goals  Acute Rehab OT Goals Patient Stated Goal: get stronger before going home  Plan Discharge plan remains appropriate    Co-evaluation  End of Session     Activity Tolerance Patient limited by fatigue   Patient Left in bed;with call bell/phone within reach   Nurse Communication          Time: 1345-1411 OT Time Calculation (min): 26 min  Charges: OT General Charges $OT Visit: 1 Procedure OT Treatments $Self Care/Home Management : 23-37 mins  Malka So 10/20/2014, 2:19 PM

## 2014-10-20 NOTE — Progress Notes (Signed)
Subjective: Interval History: has no complaint, made a little urine yest.  Objective: Vital signs in last 24 hours: Temp:  [97.5 F (36.4 C)-97.9 F (36.6 C)] 97.9 F (36.6 C) (05/17 0449) Pulse Rate:  [89-96] 89 (05/17 0449) Resp:  [15-18] 16 (05/17 0449) BP: (111-126)/(78-91) 115/85 mmHg (05/17 0449) SpO2:  [98 %-100 %] 100 % (05/17 0449) Weight:  [40.6 kg (89 lb 8.1 oz)-45.768 kg (100 lb 14.4 oz)] 45.768 kg (100 lb 14.4 oz) (05/16 2042) Weight change: -8.15 kg (-17 lb 15.5 oz)  Intake/Output from previous day: 05/16 0701 - 05/17 0700 In: 240 [P.O.:240] Out: 1525 [Urine:25] Intake/Output this shift:    General appearance: alert, cooperative and cachectic Neck: Ij cath Resp: diminished breath sounds bilaterally and rales bibasilar Cardio: S1, S2 normal and systolic murmur: holosystolic 2/6, blowing at apex GI: pos bs, liver down 5 cm Extremities: extremities normal, atraumatic, no cyanosis or edema  Lab Results:  Recent Labs  10/19/14 0654 10/20/14 0353  WBC 5.9 5.9  HGB 8.2* 9.4*  HCT 25.9* 29.6*  PLT 42* 46*   BMET:  Recent Labs  10/19/14 0654 10/20/14 0353  NA 135 135  K 3.8 4.1  CL 101 103  CO2 30 25  GLUCOSE 99 92  BUN <5* <5*  CREATININE 2.33* 1.43*  CALCIUM 7.4* 7.6*   No results for input(s): PTH in the last 72 hours. Iron Studies:  Recent Labs  10/20/14 0353  IRON 120  TIBC 143*  FERRITIN 1356*    Studies/Results: No results found.  I have reviewed the patient's current medications.  Assessment/Plan: 1 ESRD  AKI +CKD4  No recovery, plans for ESRD mgmt,  Access pending. 2 SLE inactive 3 Anemia 4 Severe malnutrition 5 coags may need vit K/FFP 6 DM controlled 7 EtOH abuse P nutrition, HD, access,  Epo, vit D   LOS: 8 days   [name] [name] L 10/20/2014,10:13 AM

## 2014-10-20 NOTE — Consult Note (Addendum)
Pt still awaiting vein mapping.  Coagulapathy persistant.  Will return for further discussions with patient regarding access once vein mapping complete.  Severe protein calorie malnutrition.  Pt started on Nepro supplements.  Ruta Hinds, MD [facility] Office: [phone] Pager: [phone]

## 2014-10-20 NOTE — Consult Note (Signed)
   [facility] CM Inpatient Consult   10/20/2014  [name] [name] [birth year] [id number] Patient evaluated for community based chronic disease management services with [facility] as a benefit of patient's [facility] Medicare Insurance. Spoke with patient at bedside to explain [place] Management services.  Patient will receive post discharge transition of care call and will be evaluated for monthly home visits for assessments and disease process education.  Left contact information and [facility] literature at bedside. Made Inpatient Case Manager aware that [place] Management following. Patient is currently planning to go to a skilled nursing facility but, consented to [place] Management services when she returns home.  Consent form signed. She states [name] is her support contact and drives her to her MD appointments.  Of note, [facility] Care Management services does not replace or interfere with any services that are arranged by inpatient case management or social work.  For additional questions or referrals please contact:   [name] Brood, RN BSN [facility] Liaison  [phone] business mobile phone

## 2014-10-20 NOTE — Progress Notes (Signed)
Physical Therapy Treatment Patient Details Name: [name] [name] MRN: [id number] DOB: 02/15/1956 Today's Date: 10/20/2014    History of Present Illness Pt admitted with dyspnea and inability to take medications due to vomiting.  Pt also reports diarrhea. Now s/p thoracentesis and placement of central venous HD catheter. Pt discharged last week with HCAP. PMH: CKD, CHF, Lupus, pericardial window, ETOH window, HTN, depression.    PT Comments    Very fatigued today, but willing to work, and showing progress with less supplemental O2 need; Walked in hallway on 2LO2, and O2 sats remained greater than or equal to 95%  Follow Up Recommendations  SNF;Supervision/Assistance - 24 hour     Equipment Recommendations  None recommended by PT    Recommendations for Other Services       Precautions / Restrictions Precautions Precautions: Fall    Mobility  Bed Mobility Overal bed mobility: Needs Assistance Bed Mobility: Supine to Sit     Supine to sit: Supervision     General bed mobility comments: Used bed rails; HOB elevated  Transfers Overall transfer level: Needs assistance Equipment used: 1 person hand held assist;None Transfers: Sit to/from Stand Sit to Stand: Min guard (with and without physical contact)         General transfer comment: verbal cues for hand placement  Ambulation/Gait Ambulation/Gait assistance: Min guard (without physical contact) Ambulation Distance (Feet): 75 Feet (including stop in bathroom) Assistive device:  (pushing Dinamap) Gait Pattern/deviations: Step-through pattern   Gait velocity interpretation: Below normal speed for age/gender General Gait Details: No gross loss of balance with bil UE support.  Pt still weak and cannot withstand challenges to balance.  Used cane PTA therefore is not at baseline.     Stairs            Wheelchair Mobility    Modified Rankin (Stroke Patients Only)       Balance             Standing  balance-Leahy Scale: Fair                      Cognition Arousal/Alertness: Awake/alert Behavior During Therapy: WFL for tasks assessed/performed Overall Cognitive Status: Within Functional Limits for tasks assessed                      Exercises      General Comments        Pertinent Vitals/Pain Pain Assessment: No/denies pain    Home Living                      Prior Function            PT Goals (current goals can now be found in the care plan section) Acute Rehab PT Goals Patient Stated Goal: get stronger before going home PT Goal Formulation: With patient Time For Goal Achievement: 10/28/14 Potential to Achieve Goals: Good Progress towards PT goals: Progressing toward goals    Frequency  Min 3X/week    PT Plan Current plan remains appropriate    Co-evaluation             End of Session Equipment Utilized During Treatment: Oxygen Activity Tolerance: Patient limited by fatigue Patient left: in chair;with call bell/phone within reach     Time: 0916-0944 PT Time Calculation (min) (ACUTE ONLY): 28 min  Charges:  $Gait Training: 23-37 mins  G Codes:      Roney Marion [facility] 10/20/2014, 9:57 AM  Roney Marion, PT  Acute Rehabilitation Services Pager [phone] Office [phone]

## 2014-10-20 NOTE — Progress Notes (Signed)
Patient ID: ODALYS WIN, female   DOB: [birth year], 59 y.o.   MRN: [id number] Medicine attending: She is clinically stable now on dialysis. We discussed perioperative transfusion therapy with vascular surgery in anticipation of vein mapping and AV fistula formation. Her prothrombin time is up to 9 seconds overcontrol and platelet count as low as 37,000 secondary to underlying cirrhosis. She likely also has a qualitative platelet defect from uremia. It would be appropriate to cover her with 2 units of fresh frozen plasma and 1 unit of single donor platelets for the vascular procedure.

## 2014-10-21 LAB — RENAL FUNCTION PANEL
Albumin: 1.2 g/dL — ABNORMAL LOW (ref 3.5–5.0)
Anion gap: 5 (ref 5–15)
BUN: 6 mg/dL (ref 6–20)
CO2: 28 mmol/L (ref 22–32)
CREATININE: 2.54 mg/dL — AB (ref 0.44–1.00)
Calcium: 7.6 mg/dL — ABNORMAL LOW (ref 8.9–10.3)
Chloride: 101 mmol/L (ref 101–111)
GFR calc non Af Amer: 20 mL/min — ABNORMAL LOW (ref >60)
GFR, EST AFRICAN AMERICAN: 23 mL/min — AB (ref >60)
Glucose, Bld: 120 mg/dL — ABNORMAL HIGH (ref 65–99)
PHOSPHORUS: 2.8 mg/dL (ref 2.5–4.6)
Potassium: 4.3 mmol/L (ref 3.5–5.1)
SODIUM: 134 mmol/L — AB (ref 135–145)

## 2014-10-21 LAB — CBC
HEMATOCRIT: 40.3 % (ref 36.0–46.0)
Hemoglobin: 12.6 g/dL (ref 12.0–15.0)
MCH: 29.5 pg (ref 26.0–34.0)
MCHC: 31.3 g/dL (ref 30.0–36.0)
MCV: 94.4 fL (ref 78.0–100.0)
PLATELETS: 36 10*3/uL — AB (ref 150–400)
RBC: 4.27 MIL/uL (ref 3.87–5.11)
RDW: 19.5 % — ABNORMAL HIGH (ref 11.5–15.5)
WBC: 4.8 10*3/uL (ref 4.0–10.5)

## 2014-10-21 MED ORDER — SODIUM CHLORIDE 0.9 % IV SOLN: 100.0000 mL | INTRAVENOUS | Status: DC | PRN

## 2014-10-21 MED ORDER — HEPARIN SODIUM (PORCINE) 1000 UNIT/ML DIALYSIS: 40.0000 [IU]/kg | Freq: Once | INTRAMUSCULAR | Status: DC

## 2014-10-21 MED ORDER — HEPARIN SODIUM (PORCINE) 1000 UNIT/ML DIALYSIS: 1000.0000 [IU] | INTRAMUSCULAR | Status: DC | PRN

## 2014-10-21 MED ORDER — SODIUM CHLORIDE 0.9 % IV SOLN: Freq: Once | INTRAVENOUS | Status: DC

## 2014-10-21 MED ORDER — PENTAFLUOROPROP-TETRAFLUOROETH EX AERO: 1.0000 "application " | INHALATION_SPRAY | CUTANEOUS | Status: DC | PRN

## 2014-10-21 MED ORDER — LIDOCAINE HCL (PF) 1 % IJ SOLN: 5.0000 mL | INTRAMUSCULAR | Status: DC | PRN

## 2014-10-21 MED ORDER — NEPRO/CARBSTEADY PO LIQD: 237.0000 mL | ORAL | Status: DC | PRN

## 2014-10-21 MED ORDER — LIDOCAINE-PRILOCAINE 2.5-2.5 % EX CREA: 1.0000 "application " | TOPICAL_CREAM | CUTANEOUS | Status: DC | PRN

## 2014-10-21 MED ORDER — DARBEPOETIN ALFA 200 MCG/0.4ML IJ SOSY
PREFILLED_SYRINGE | INTRAMUSCULAR | Status: AC
  Filled 2014-10-21: qty 0.4

## 2014-10-21 MED ORDER — ALTEPLASE 2 MG IJ SOLR
2.0000 mg | Freq: Once | INTRAMUSCULAR | Status: DC | PRN
  Filled 2014-10-21: qty 2

## 2014-10-21 NOTE — Progress Notes (Signed)
Subjective:    The patient continues to do well, she sitting up in a chair at her bedside this morning. She denies any pain or shortness of breath.  Interval Events: -Afebrile, vital signs stable. -Vein mapping completed.    Objective:    Vital Signs:   Temp:  [97.5 F (36.4 C)-98.6 F (37 C)] 98.6 F (37 C) (05/18 0811) Pulse Rate:  [84-97] 97 (05/18 0811) Resp:  [16-18] 18 (05/18 0811) BP: (110-121)/(68-91) 115/89 mmHg (05/18 0811) SpO2:  [97 %-100 %] 97 % (05/18 0811) Weight:  [101 lb 3.2 oz (45.904 kg)] 101 lb 3.2 oz (45.904 kg) (05/17 2035) Last BM Date: 10/20/14  24-hour weight change: Weight change: 11 lb 11.1 oz (5.304 kg)  Intake/Output:   Intake/Output Summary (Last 24 hours) at 10/21/14 1047 Last data filed at 10/21/14 1032  Gross per 24 hour  Intake    600 ml  Output      1 ml  Net    599 ml      Physical Exam: General: Thin, cachectic appearing, alert, in no acute distress.  Lungs:  Clear to auscultation bilaterally, no wheezing, rales or rhonchi.   Heart: Regular rate and rhythm, S1 and S2 normal without gallop, murmur, or rubs.  Abdomen:  BS normoactive, nontender, nondistended.  Extremities: No pretibial edema, no tenderness to palpation.     Labs:  Basic Metabolic Panel:  Recent Labs Lab 10/15/14 0314 10/16/14 0217 10/17/14 0319 10/17/14 0925 10/19/14 0654 10/20/14 0353  NA 138 135 135 134* 135 135  K 3.0* 3.8 3.8 3.6 3.8 4.1  CL 104 102 101 101 101 103  CO2 23 25 26 29 30 25   GLUCOSE 97 172* 115* 188* 99 92  BUN 24* 10 <5* <5* <5* <5*  CREATININE 4.79* 2.79* 1.75* 1.87* 2.33* 1.43*  CALCIUM 6.3* 6.7* 7.1* 7.1* 7.4* 7.6*  MG 2.1 1.8 1.7  --  1.5* 2.0  PHOS 4.7* 2.6 1.6* 1.7* 3.5 2.6    Liver Function Tests:  Recent Labs Lab 10/17/14 0319 10/17/14 0925 10/19/14 0654 10/19/14 1558 10/20/14 0353  AST  --   --   --  56*  --   ALT  --   --   --  21  --   ALKPHOS  --   --   --  311*  --   BILITOT  --   --   --  0.8  --     PROT  --   --   --  8.1  --   ALBUMIN 1.1* 1.0* 1.2* 1.3* 1.2*   CBC:  Recent Labs Lab 10/16/14 0217 10/17/14 0319 10/17/14 0925 10/19/14 0654 10/20/14 0353  WBC 6.5 6.8 6.6 5.9 5.9  HGB 8.4* 8.7* 8.0* 8.2* 9.4*  HCT 25.4* 26.5* 25.0* 25.9* 29.6*  MCV 87.9 90.1 90.6 92.8 93.4  PLT 38* 42* 37* 42* 46*    Cardiac Enzymes: No results for input(s): CKTOTAL, CKMB, CKMBINDEX, TROPONINI in the last 168 hours. Iron/TIBC/Ferritin/ %Sat    Component Value Date/Time   IRON 120 10/20/2014 0353   TIBC 143* 10/20/2014 0353   FERRITIN 1356* 10/20/2014 0353   IRONPCTSAT 84* 10/20/2014 0353   IRONPCTSAT NOT CALC 06/18/2014 1620   Microbiology: Results for orders placed or performed during the hospital encounter of 10/12/14  Culture, blood (routine x 2)     Status: None   Collection Time: 10/12/14 10:04 PM  Result Value Ref Range Status   Specimen Description BLOOD LEFT ANTECUBITAL  Final   Special Requests BOTTLES DRAWN AEROBIC AND ANAEROBIC 5CC EA  Final   Culture   Final    NO GROWTH 5 DAYS Performed at [facility]    Report Status 10/19/2014 FINAL  Final  Culture, blood (routine x 2)     Status: None   Collection Time: 10/12/14 10:16 PM  Result Value Ref Range Status   Specimen Description BLOOD LEFT WRIST  Final   Special Requests BOTTLES DRAWN AEROBIC ONLY 3CC  Final   Culture   Final    NO GROWTH 5 DAYS Note: Culture results may be compromised due to an inadequate volume of blood received in culture bottles. Performed at [facility]    Report Status 10/19/2014 FINAL  Final  MRSA PCR Screening     Status: None   Collection Time: 10/12/14 11:40 PM  Result Value Ref Range Status   MRSA by PCR NEGATIVE NEGATIVE Final    Comment:        The GeneXpert MRSA Assay (FDA approved for NASAL specimens only), is one component of a comprehensive MRSA colonization surveillance program. It is not intended to diagnose MRSA infection nor to guide or monitor  treatment for MRSA infections.   Urine culture     Status: None   Collection Time: 10/13/14  4:12 AM  Result Value Ref Range Status   Specimen Description URINE, RANDOM  Final   Special Requests NONE  Final   Colony Count   Final    >=100,000 COLONIES/ML Performed at [facility]    Culture   Final    Multiple bacterial morphotypes present, none predominant. Suggest appropriate recollection if clinically indicated. Performed at [facility]    Report Status 10/14/2014 FINAL  Final  Body fluid culture     Status: None   Collection Time: 10/13/14  1:18 PM  Result Value Ref Range Status   Specimen Description FLUID RIGHT PLEURAL  Final   Special Requests NONE  Final   Gram Stain   Final    RARE WBC PRESENT, PREDOMINANTLY MONONUCLEAR NO ORGANISMS SEEN Performed at [facility]    Culture   Final    NO GROWTH 3 DAYS Performed at [facility]    Report Status 10/17/2014 FINAL  Final  MRSA PCR Screening     Status: None   Collection Time: 10/18/14  7:45 AM  Result Value Ref Range Status   MRSA by PCR NEGATIVE NEGATIVE Final    Comment:        The GeneXpert MRSA Assay (FDA approved for NASAL specimens only), is one component of a comprehensive MRSA colonization surveillance program. It is not intended to diagnose MRSA infection nor to guide or monitor treatment for MRSA infections.    Imaging: No results found.     Medications:    Infusions: . sodium chloride 10 mL/hr at 10/13/14 0131    Scheduled Medications: . antiseptic oral rinse  7 mL Mouth Rinse BID  . calcitRIOL  0.25 mcg Oral Daily  . calcium carbonate  6 tablet Oral QHS  . darbepoetin (ARANESP) injection - DIALYSIS  200 mcg Intravenous Q Wed-HD  . feeding supplement (NEPRO CARB STEADY)  237 mL Oral TID WC  . FLUoxetine  10 mg Oral Daily  . hydroxychloroquine  400 mg Oral Daily  . multivitamin  1 tablet Oral QHS  . ondansetron  4 mg Intravenous Once  . predniSONE  5  mg Oral Q breakfast  . ramelteon  8 mg Oral QHS  . vitamin B-12  250 mcg Oral QPM    PRN Medications: sodium chloride, sodium chloride, camphor-menthol, feeding supplement (NEPRO CARB STEADY), heparin, lidocaine (PF), lidocaine-prilocaine, loperamide, oxyCODONE, pentafluoroprop-tetrafluoroeth   Assessment/ Plan:    Principal Problem:   End stage renal disease Active Problems:   Alcohol abuse   FSGS (focal segmental glomerulosclerosis)   Anemia of chronic disease   Lupus (systemic lupus erythematosus)   Elevated troponin   Electrolyte abnormality   Metabolic acidosis   Coagulopathy  #Volume overload secondary to ESRD Currently waiting on long-term vascular access. Vein mapping completed yesterday. -Possible dialysis today per renal. -Long-term dialysis access per vascular surgery will call today to discuss. -Continue CLIP process. -Patient will need to complete the hepatitis B series as an outpatient. -Oxygen for comfort, asked patient to try going without oxygen. -Regular diet due to severe malnutrition.  #Alcohol liver disease -No further workup. -Would recommend giving platelets and two units of FFP prior to surgery.  #SLE Stable. She could potentially transition to NSAIDs as an outpatient. -Continue prednisone 5 mg daily. -Continue hydroxychloroquine 400 mg daily. -Patient will need an eye exam as an outpatient. -Continue oxycodone 5 mg every 4 hours when necessary.  #Itching/dry skin -Continue Sarna lotion.  #Mixed metabolic acidosis due to ESRD Resolved. -Continue to monitor to determine if she will need oral bicarbonate as an outpatient.  #Electrolyte abnormalities -Continue to monitor.  #CKD related metabolic bone disease -Continue home calcium carbonate 1200 mg 3 times a day. -Continue home calcitriol 0.25 g daily.  #Anemia of chronic disease -Continue Aranesp.  #Prolonged QTc -Avoid QT prolonging  medications.  #Hypertension Normotensive. -Continue to hold home amlodipine and metoprolol-may not need on discharge.  #Alcohol abuse -CIWA protocol.  #Depression -Continue home fluoxetine 10 mg daily.  #Severe malnutrition -Continue feeding supplement per nutrition recommendations. -Continue thiamine, folate, B-12.   DVT PPX - SCD's while in bed  CODE STATUS - Full.  CONSULTS PLACED - Nephrology.  DISPO - Disposition is deferred at this time, awaiting improvement of dyspnea.   Anticipated discharge in approximately 2-4 day(s).   The patient does have a current PCP Eulas Post, Brett Canales, MD) and does need an [facility] hospital follow-up appointment after discharge.    Is the [facility] hospital follow-up appointment a one-time only appointment? no.  Does the patient have transportation limitations that hinder transportation to clinic appointments? yes   SERVICE NEEDED AT [place]         Y = Yes, Blank = No PT: SNF  OT: SNF  RN:   Equipment:   Other:      Length of Stay: 9 day(s)   Signed: Charlesetta Shanks, MD  PGY-1, Internal Medicine Resident Pager: [phone] (7AM-5PM) 10/21/2014, 10:47 AM

## 2014-10-21 NOTE — Progress Notes (Signed)
Subjective: Interval History: has no complaint has been up and walking.  Objective: Vital signs in last 24 hours: Temp:  [97.5 F (36.4 C)-98.6 F (37 C)] 98.6 F (37 C) (05/18 0811) Pulse Rate:  [84-97] 97 (05/18 0811) Resp:  [16-18] 18 (05/18 0811) BP: (110-121)/(68-91) 115/89 mmHg (05/18 0811) SpO2:  [97 %-100 %] 97 % (05/18 0811) Weight:  [45.904 kg (101 lb 3.2 oz)] 45.904 kg (101 lb 3.2 oz) (05/17 2035) Weight change: 5.304 kg (11 lb 11.1 oz)  Intake/Output from previous day: 05/17 0701 - 05/18 0700 In: 240 [P.O.:240] Out: 1 [Stool:1] Intake/Output this shift:    General appearance: alert, cooperative and cachectic Resp: diminished breath sounds bilaterally Breasts: not eval GI: liver down 6 cm, pos bs, soft, Extremities: 2+ presacral edema  CV reg Gr 2/6 M  Lab Results:  Recent Labs  10/19/14 0654 10/20/14 0353  WBC 5.9 5.9  HGB 8.2* 9.4*  HCT 25.9* 29.6*  PLT 42* 46*   BMET:  Recent Labs  10/19/14 0654 10/20/14 0353  NA 135 135  K 3.8 4.1  CL 101 103  CO2 30 25  GLUCOSE 99 92  BUN <5* <5*  CREATININE 2.33* 1.43*  CALCIUM 7.4* 7.6*   No results for input(s): PTH in the last 72 hours. Iron Studies:  Recent Labs  10/20/14 0353  IRON 120  TIBC 143*  FERRITIN 1356*    Studies/Results: No results found.  I have reviewed the patient's current medications.  Assessment/Plan: 1 ESRD  For HD.  Needs perm access 2 Coagulopathy related to liver dz. Cover at time of surgery 3 Malnutrition 4 Anemia  Fe ok 5 HPTH P HD, epo, access    LOS: 9 days   Veda Arrellano L 10/21/2014,9:46 AM

## 2014-10-21 NOTE — Progress Notes (Signed)
[name], female   DOB: [birth year], 59 y.o.   MRN: [id number] Medicine attending: I personally examined this patient this morning together with resident physician Dr. Karle Starch Moding and I concur with his evaluation and management plan. We appreciate vascular surgery assistance. The patient will have removal of the right internal jugular vascular access line, replacement with a tunneled vascular temporary dialysis catheter, an initial surgery to place an AV fistula tomorrow. Preop transfusion with fresh frozen plasma and platelets in view of her alcohol-related cirrhosis with associated coagulopathy.

## 2014-10-21 NOTE — Procedures (Signed)
I was present at this session.  I have reviewed the session itself and made appropriate changes.  Hd via [facility].  bp decreased early, keep even.    Vivi Piccirilli L 5/18/20163:53 PM

## 2014-10-21 NOTE — Clinical Social Work Placement (Signed)
   CLINICAL SOCIAL WORK PLACEMENT  NOTE  Date:  10/21/2014  Patient Details  Name: [name] [name] MRN: [id number] Date of Birth: [birth year]  Clinical Social Work is seeking post-discharge placement for this patient at the [place] level of care (*CSW will initial, date and re-position this form in  chart as items are completed):  Yes   Patient/family provided with [place] Work Department's list of facilities offering this level of care within the geographic area requested by the patient (or if unable, by the patient's family).  Yes   Patient/family informed of their freedom to choose among providers that offer the needed level of care, that participate in Medicare, Medicaid or managed care program needed by the patient, have an available bed and are willing to accept the patient.  Yes   Patient/family informed of [facility] ownership interest in [facility] and [facility], as well as of the fact that they are under no obligation to receive care at these facilities.  PASRR submitted to EDS on 10/15/14     PASRR number received on       Existing PASRR number confirmed on       FL2 transmitted to all facilities in geographic area requested by pt/family on 10/16/14     FL2 transmitted to all facilities within larger geographic area on       Patient informed that his/her managed care company has contracts with or will negotiate with certain facilities, including the following:         YES   Patient/family informed of bed offers received.  Patient chooses bed at  [facility]     Physician recommends and patient chooses bed at      Patient to be transferred to  [place] on  .  Patient to be transferred to facility by       Patient family notified on   of transfer.  Name of family member notified:        PHYSICIAN       Additional Comment:    _______________________________________________ Sable Feil, LCSW 10/21/2014, 4:00 PM

## 2014-10-21 NOTE — Clinical Social Work Note (Signed)
CSW followed up with patient regarding discharge and facility placement and her choice remains [facility]. CSW will continue to follow and facilitate transfer to skilled facility when medically stable.   Sharnetta Gielow Givens, MSW, LCSW Licensed Clinical Social Worker [place] [phone]

## 2014-10-21 NOTE — Care Management Note (Signed)
Case Management Note  Patient Details  Name: [name] [name] MRN: [id number] Date of Birth: 1955-11-01  Subjective/Objective:                 CM following for progression and d/c planning.   Action/Plan: Met with pt and IM given and explained, await perm cath and access placement for hemodialysis, plan is to d/c to SNF for short term rehab. CSW, [facility] [place] working with pt and daughter.   Expected Discharge Date:           10/23/2014       Expected Discharge Plan:  Skilled Nursing Facility  In-House Referral:  Clinical Social Work  Discharge planning Services  CM Consult  Post Acute Care Choice:    Choice offered to:     DME Arranged:    DME Agency:     HH Arranged:    [place] Agency:     Status of Service:  Complete  Medicare Important Message Given:  Yes Date Medicare IM Given:  10/15/14 Medicare IM give by:  debbie dowell rn,bsn Date Additional Medicare IM Given:  10/21/14 Additional Medicare Important Message give by:  Jasmine Pang RN MPH, case management [phone]  If discussed at Long Length of Stay Meetings, dates discussed:    Additional Comments:  Adron Bene, RN 10/21/2014, 11:17 AM

## 2014-10-21 NOTE — Consult Note (Signed)
[facility]          Consult Note   Objective 115/89 97 98.6 F (37 C) (Oral) 18 97%  Intake/Output Summary (Last 24 hours) at 10/21/14 1309 Last data filed at 10/21/14 1226  Gross per 24 hour  Intake    720 ml  Output      1 ml  Net    719 ml   2+ brachial and radial pulse Right side dialysis catheter   Laboratory Lab Results:  Recent Labs  10/19/14 0654 10/20/14 0353  WBC 5.9 5.9  HGB 8.2* 9.4*  HCT 25.9* 29.6*  PLT 42* 46*   BMET  Recent Labs  10/19/14 0654 10/20/14 0353  NA 135 135  K 3.8 4.1  CL 101 103  CO2 30 25  GLUCOSE 99 92  BUN <5* <5*  CREATININE 2.33* 1.43*  CALCIUM 7.4* 7.6*    COAG Lab Results  Component Value Date   INR 1.86* 10/19/2014   INR 2.13* 10/13/2014   INR 1.63* 09/20/2014   No results found for: PTT  Vein map shows totally inadequate vein for fistula both arms   Assessment/Planning:  Pt needs long term dialysis access.  She will be high risk for placement of graft/catheter due to her underlying coagulapathy/malnutrition/thrombocytopenia due to her liver dysfunction.  Will transfuse FFP and platelets perioperatively as suggested by Dr Beryle Beams.  Please DO NOT DIALYZE PATIENT TOMORROW UNTIL AFTER HER SURGICAL PROCEDURE DUE TO THE FACT SHE IS GOING TO RECEIVE SIGNIFICANT VOLUME WITH FFP PLATELETS ANESTHESIA IV FLUIDS  Procedure will by done by my partner Dr Bridgett Larsson who will follow up on her clotting factor studies preop tomorrow post transfusion.   Risk, benefits, and alternatives to access surgery were discussed.  The patient is aware the risks include but are not limited to: bleeding, infection, steal syndrome, nerve damage, ischemic neuropathy, failure to mature, and need for additional procedures.  The patient agrees to proceed.   [name] [name] 10/21/2014 1:09 PM --

## 2014-10-22 ENCOUNTER — Inpatient Hospital Stay (HOSPITAL_COMMUNITY): Payer: Medicare Other

## 2014-10-22 ENCOUNTER — Inpatient Hospital Stay (HOSPITAL_COMMUNITY): Payer: Medicare Other | Admitting: Anesthesiology

## 2014-10-22 ENCOUNTER — Encounter (HOSPITAL_COMMUNITY): Payer: Self-pay | Admitting: Anesthesiology

## 2014-10-22 ENCOUNTER — Encounter (HOSPITAL_COMMUNITY)
Admission: EM | Disposition: A | Discharge status: Skilled Nursing Facility | Payer: Self-pay | Source: Home / Self Care | Attending: Oncology

## 2014-10-22 HISTORY — PX: EXCHANGE OF A DIALYSIS CATHETER: SHX5818

## 2014-10-22 LAB — BASIC METABOLIC PANEL
ANION GAP: 7 (ref 5–15)
BUN: <5 mg/dL — ABNORMAL LOW (ref 6–20)
CALCIUM: 7.9 mg/dL — AB (ref 8.9–10.3)
CO2: 24 mmol/L (ref 22–32)
CREATININE: 1.75 mg/dL — AB (ref 0.44–1.00)
Chloride: 105 mmol/L (ref 101–111)
GFR calc Af Amer: 36 mL/min — ABNORMAL LOW (ref >60)
GFR, EST NON AFRICAN AMERICAN: 31 mL/min — AB (ref >60)
Glucose, Bld: 73 mg/dL (ref 65–99)
POTASSIUM: 3.8 mmol/L (ref 3.5–5.1)
Sodium: 136 mmol/L (ref 135–145)

## 2014-10-22 LAB — CBC
HEMATOCRIT: 24.3 % — AB (ref 36.0–46.0)
HEMOGLOBIN: 7.6 g/dL — AB (ref 12.0–15.0)
MCH: 29.5 pg (ref 26.0–34.0)
MCHC: 31.3 g/dL (ref 30.0–36.0)
MCV: 94.2 fL (ref 78.0–100.0)
Platelets: 45 10*3/uL — ABNORMAL LOW (ref 150–400)
RBC: 2.58 MIL/uL — ABNORMAL LOW (ref 3.87–5.11)
RDW: 19.6 % — AB (ref 11.5–15.5)
WBC: 4.4 10*3/uL (ref 4.0–10.5)

## 2014-10-22 LAB — POCT I-STAT 4, (NA,K, GLUC, HGB,HCT)
Glucose, Bld: 73 mg/dL (ref 65–99)
HEMATOCRIT: 26 % — AB (ref 36.0–46.0)
HEMOGLOBIN: 8.8 g/dL — AB (ref 12.0–15.0)
Potassium: 3.6 mmol/L (ref 3.5–5.1)
SODIUM: 138 mmol/L (ref 135–145)

## 2014-10-22 LAB — APTT: aPTT: 33 seconds (ref 24–37)

## 2014-10-22 LAB — PROTIME-INR
INR: 1.67 — AB (ref 0.00–1.49)
Prothrombin Time: 19.7 seconds — ABNORMAL HIGH (ref 11.6–15.2)

## 2014-10-22 LAB — MAGNESIUM: Magnesium: 1.7 mg/dL (ref 1.7–2.4)

## 2014-10-22 LAB — ALBUMIN: Albumin: 1.6 g/dL — ABNORMAL LOW (ref 3.5–5.0)

## 2014-10-22 LAB — PHOSPHORUS: Phosphorus: 2.1 mg/dL — ABNORMAL LOW (ref 2.5–4.6)

## 2014-10-22 SURGERY — EXCHANGE OF A DIALYSIS CATHETER
Anesthesia: Monitor Anesthesia Care | Laterality: Left

## 2014-10-22 MED ORDER — PROPOFOL 10 MG/ML IV BOLUS
INTRAVENOUS | Status: AC
  Filled 2014-10-22: qty 20

## 2014-10-22 MED ORDER — FENTANYL CITRATE (PF) 100 MCG/2ML IJ SOLN
INTRAMUSCULAR | Status: DC | PRN
  Administered 2014-10-22 (×2): 50 ug via INTRAVENOUS

## 2014-10-22 MED ORDER — SODIUM CHLORIDE 0.9 % IR SOLN
Status: DC | PRN
  Administered 2014-10-22: 1

## 2014-10-22 MED ORDER — FENTANYL CITRATE (PF) 100 MCG/2ML IJ SOLN: 25.0000 ug | INTRAMUSCULAR | Status: DC | PRN

## 2014-10-22 MED ORDER — ONDANSETRON HCL 4 MG/2ML IJ SOLN
INTRAMUSCULAR | Status: AC
  Filled 2014-10-22: qty 2

## 2014-10-22 MED ORDER — CEFAZOLIN SODIUM-DEXTROSE 2-3 GM-% IV SOLR
INTRAVENOUS | Status: DC | PRN
  Administered 2014-10-22: 2 g via INTRAVENOUS

## 2014-10-22 MED ORDER — CEFAZOLIN SODIUM-DEXTROSE 2-3 GM-% IV SOLR
INTRAVENOUS | Status: AC
  Filled 2014-10-22: qty 50

## 2014-10-22 MED ORDER — LIDOCAINE-EPINEPHRINE (PF) 1 %-1:200000 IJ SOLN
INTRAMUSCULAR | Status: DC | PRN
  Administered 2014-10-22: 20 mL via INTRADERMAL

## 2014-10-22 MED ORDER — LIDOCAINE-EPINEPHRINE (PF) 1 %-1:200000 IJ SOLN
INTRAMUSCULAR | Status: AC
  Filled 2014-10-22: qty 10

## 2014-10-22 MED ORDER — FENTANYL CITRATE (PF) 250 MCG/5ML IJ SOLN
INTRAMUSCULAR | Status: AC
  Filled 2014-10-22: qty 5

## 2014-10-22 MED ORDER — HEPARIN SODIUM (PORCINE) 1000 UNIT/ML IJ SOLN
INTRAMUSCULAR | Status: AC
Start: 2014-10-22 — End: 2014-10-22
  Filled 2014-10-22: qty 1

## 2014-10-22 MED ORDER — SODIUM CHLORIDE 0.9 % IR SOLN
Status: DC | PRN
  Administered 2014-10-22: 500 mL

## 2014-10-22 SURGICAL SUPPLY — 41 items
BAG DECANTER FOR FLEXI CONT (MISCELLANEOUS) ×3 IMPLANT
BIOPATCH RED 1 DISK 7.0 (GAUZE/BANDAGES/DRESSINGS) ×2 IMPLANT
BIOPATCH RED 1IN DISK 7.0MM (GAUZE/BANDAGES/DRESSINGS) ×1
CATH CANNON HEMO 15F 50CM (CATHETERS) IMPLANT
CATH CANNON HEMO 15FR 19 (HEMODIALYSIS SUPPLIES) ×2 IMPLANT
CATH CANNON HEMO 15FR 23CM (HEMODIALYSIS SUPPLIES) IMPLANT
CATH CANNON HEMO 15FR 31CM (HEMODIALYSIS SUPPLIES) IMPLANT
CATH CANNON HEMO 15FR 32 (HEMODIALYSIS SUPPLIES) IMPLANT
CATH CANNON HEMO 15FR 32CM (HEMODIALYSIS SUPPLIES) IMPLANT
COVER PROBE W GEL 5X96 (DRAPES) ×3 IMPLANT
DRAPE C-ARM 42X72 X-RAY (DRAPES) ×3 IMPLANT
DRAPE CHEST BREAST 15X10 FENES (DRAPES) ×3 IMPLANT
GAUZE SPONGE 2X2 8PLY STRL LF (GAUZE/BANDAGES/DRESSINGS) ×1 IMPLANT
GAUZE SPONGE 4X4 16PLY XRAY LF (GAUZE/BANDAGES/DRESSINGS) ×3 IMPLANT
GLOVE BIO SURGEON STRL SZ7 (GLOVE) ×5 IMPLANT
GLOVE BIOGEL PI IND STRL 7.5 (GLOVE) ×1 IMPLANT
GLOVE BIOGEL PI INDICATOR 7.5 (GLOVE) ×2
GOWN STRL REUS W/ TWL LRG LVL3 (GOWN DISPOSABLE) ×3 IMPLANT
GOWN STRL REUS W/TWL LRG LVL3 (GOWN DISPOSABLE) ×9
KIT BASIN OR (CUSTOM PROCEDURE TRAY) ×3 IMPLANT
KIT ROOM TURNOVER OR (KITS) ×3 IMPLANT
NDL 18GX1X1/2 (RX/OR ONLY) (NEEDLE) ×1 IMPLANT
NDL HYPO 25GX1X1/2 BEV (NEEDLE) ×1 IMPLANT
NEEDLE 18GX1X1/2 (RX/OR ONLY) (NEEDLE) ×3 IMPLANT
NEEDLE HYPO 25GX1X1/2 BEV (NEEDLE) ×3 IMPLANT
NS IRRIG 1000ML POUR BTL (IV SOLUTION) ×3 IMPLANT
PACK SURGICAL SETUP 50X90 (CUSTOM PROCEDURE TRAY) ×3 IMPLANT
PAD ARMBOARD 7.5X6 YLW CONV (MISCELLANEOUS) ×6 IMPLANT
PAD DEFIB PEDI PADZ (MISCELLANEOUS) ×2 IMPLANT
PAD ELECT DEFIB RADIOL ZOLL (MISCELLANEOUS) ×2 IMPLANT
SOAP 2 % CHG 4 OZ (WOUND CARE) ×3 IMPLANT
SPONGE GAUZE 2X2 STER 10/PKG (GAUZE/BANDAGES/DRESSINGS) ×2
SUT ETHILON 3 0 PS 1 (SUTURE) ×3 IMPLANT
SUT MNCRL AB 4-0 PS2 18 (SUTURE) ×3 IMPLANT
SYR 20CC LL (SYRINGE) ×6 IMPLANT
SYR 30ML LL (SYRINGE) IMPLANT
SYR 3ML LL SCALE MARK (SYRINGE) ×3 IMPLANT
SYR 5ML LL (SYRINGE) ×3 IMPLANT
SYR CONTROL 10ML LL (SYRINGE) ×3 IMPLANT
SYRINGE 10CC LL (SYRINGE) ×3 IMPLANT
WATER STERILE IRR 1000ML POUR (IV SOLUTION) ×3 IMPLANT

## 2014-10-22 NOTE — Transfer of Care (Signed)
Immediate Anesthesia Transfer of Care Note  Patient: [name] [name]  Procedure(s) Performed: Procedure(s): EXCHANGE OF A DIALYSIS CATHETER (Left)  Patient Location: PACU  Anesthesia Type:MAC  Level of Consciousness: awake, alert , oriented, patient cooperative and responds to stimulation  Airway & Oxygen Therapy: Patient Spontanous Breathing and Patient connected to nasal cannula oxygen  Post-op Assessment: Report given to RN, Post -op Vital signs reviewed and stable, Patient moving all extremities and Patient moving all extremities X 4  Post vital signs: Reviewed and stable  Last Vitals:  Filed Vitals:   10/22/14 1115  BP: 142/94  Pulse: 92  Temp: 36.5 C  Resp: 18    Complications: No apparent anesthesia complications

## 2014-10-22 NOTE — Progress Notes (Signed)
I discussed the patient further with Dr. Jimmy Footman of nephrology. The patient's outpatient dialysis placement is contingent on her getting a long-term dialysis graft or fistula. However, vascular surgery is unwilling to place at this time due to risk of bleeding and complications from the procedure. They would like for her to increase her nutritional status over the next few months prior to re-discussing long-term access. As result, the patient will need to continue to receive inpatient dialysis, and she will need to go to a long-term acute care hospital while her nutritional status improves. Discussed with the social worker who will inform case management about the need for placement in a LTACH. -We'll place consult for case management to find LTACH bed. -We'll hope for placement and possible discharge tomorrow after dialysis.

## 2014-10-22 NOTE — Anesthesia Procedure Notes (Signed)
Procedure Name: MAC Date/Time: 10/22/2014 11:30 AM Performed by: Jacquiline Doe A Pre-anesthesia Checklist: Patient identified, Emergency Drugs available, Suction available, Patient being monitored and Timeout performed Patient Re-evaluated:Patient Re-evaluated prior to inductionOxygen Delivery Method: Nasal cannula Intubation Type: IV induction Placement Confirmation: positive ETCO2 Dental Injury: Teeth and Oropharynx as per pre-operative assessment

## 2014-10-22 NOTE — Progress Notes (Signed)
Subjective: Interval History: has complaints hungry.  Objective: Vital signs in last 24 hours: Temp:  [97.9 F (36.6 C)-98.7 F (37.1 C)] 98.4 F (36.9 C) (05/19 0945) Pulse Rate:  [84-96] 92 (05/19 0945) Resp:  [16-20] 17 (05/19 0945) BP: (100-126)/(75-97) 120/84 mmHg (05/19 0945) SpO2:  [95 %-100 %] 98 % (05/19 0945) Weight:  [42.3 kg (93 lb 4.1 oz)] 42.3 kg (93 lb 4.1 oz) (05/18 1900) Weight change: -3.604 kg (-7 lb 15.1 oz)  Intake/Output from previous day: 05/18 0701 - 05/19 0700 In: 1150 [P.O.:1080; I.V.:70] Out: 0  Intake/Output this shift: Total I/O In: 524 [Blood:524] Out: -   General appearance: alert, cooperative and cachectic Resp: diminished breath sounds bilaterally and rales bibasilar Cardio: S1, S2 normal and systolic murmur: holosystolic 2/6, blowing at apex GI: pos bs, liver down 5 cm Extremities: edema 2+  Lab Results:  Recent Labs  10/20/14 0353 10/21/14 1600  WBC 5.9 4.8  HGB 9.4* 12.6  HCT 29.6* 40.3  PLT 46* 36*   BMET:  Recent Labs  10/20/14 0353 10/21/14 1600  NA 135 134*  K 4.1 4.3  CL 103 101  CO2 25 28  GLUCOSE 92 120*  BUN <5* 6  CREATININE 1.43* 2.54*  CALCIUM 7.6* 7.6*   No results for input(s): PTH in the last 72 hours. Iron Studies:  Recent Labs  10/20/14 0353  IRON 120  TIBC 143*  FERRITIN 1356*    Studies/Results: No results found.  I have reviewed the patient's current medications.  Assessment/Plan: 1 ESRD for HD in am 2 Access needs perm access for chronic HD, and PC  Risk of PC for over 6 mon not acceptable and when weighed against risk of avf is not acceptable.  Will accept in outpatient unit when has access 3 Cirrhosis 4 SLE 5 Anemia ?? Hb 6 HPTH P HD, await access, recheck Hb    LOS: 10 days   [name] [name] L 10/22/2014,9:54 AM

## 2014-10-22 NOTE — Anesthesia Preprocedure Evaluation (Signed)
Anesthesia Evaluation  Patient identified by MRN, date of birth, ID band Patient awake    Reviewed: Allergy & Precautions, NPO status , Patient's Chart, lab work & pertinent test results  History of Anesthesia Complications Negative for: history of anesthetic complications  Airway Mallampati: II  TM Distance: >3 FB Neck ROM: Full    Dental  (+) Dental Advisory Given   Pulmonary shortness of breath, neg sleep apnea, neg COPDneg recent URI, former smoker,  breath sounds clear to auscultation        Cardiovascular hypertension, Pt. on medications +CHF Rhythm:Regular     Neuro/Psych Seizures -,  PSYCHIATRIC DISORDERS Depression    GI/Hepatic Neg liver ROS, GERD-  Medicated and Controlled,  Endo/Other    Renal/GU Renal disease     Musculoskeletal  (+) Arthritis -,   Abdominal   Peds  Hematology  (+) anemia ,   Anesthesia Other Findings   Reproductive/Obstetrics                             Anesthesia Physical Anesthesia Plan  ASA: III  Anesthesia Plan: MAC   Post-op Pain Management:    Induction: Intravenous  Airway Management Planned: Natural Airway  Additional Equipment: None  Intra-op Plan:   Post-operative Plan:   Informed Consent: I have reviewed the patients History and Physical, chart, labs and discussed the procedure including the risks, benefits and alternatives for the proposed anesthesia with the patient or authorized representative who has indicated his/her understanding and acceptance.   Dental advisory given  Plan Discussed with: CRNA and Surgeon  Anesthesia Plan Comments:         Anesthesia Quick Evaluation

## 2014-10-22 NOTE — Progress Notes (Signed)
PT Cancellation Note  Patient Details Name: [name] [name] MRN: [id number] DOB: [birth year]   Cancelled Treatment:    Reason Eval/Treat Not Completed: Patient at procedure or test/unavailable. Attempted to see patient again this AM but off unit for procedure. Will follow up as able.    Canary Brim Ivory Broad, PT, DPT [phone] 10/22/2014, 10:56 AM

## 2014-10-22 NOTE — Progress Notes (Signed)
Awaiting IV team disconnect/reconnect from central line to start FFP  Infusion. Dorthey Sawyer, RN

## 2014-10-22 NOTE — H&P (View-Only) (Signed)
[facility]          Consult Note   Objective 115/89 97 98.6 F (37 C) (Oral) 18 97%  Intake/Output Summary (Last 24 hours) at 10/21/14 1309 Last data filed at 10/21/14 1226  Gross per 24 hour  Intake    720 ml  Output      1 ml  Net    719 ml   2+ brachial and radial pulse Right side dialysis catheter   Laboratory Lab Results:  Recent Labs  10/19/14 0654 10/20/14 0353  WBC 5.9 5.9  HGB 8.2* 9.4*  HCT 25.9* 29.6*  PLT 42* 46*   BMET  Recent Labs  10/19/14 0654 10/20/14 0353  NA 135 135  K 3.8 4.1  CL 101 103  CO2 30 25  GLUCOSE 99 92  BUN <5* <5*  CREATININE 2.33* 1.43*  CALCIUM 7.4* 7.6*    COAG Lab Results  Component Value Date   INR 1.86* 10/19/2014   INR 2.13* 10/13/2014   INR 1.63* 09/20/2014   No results found for: PTT  Vein map shows totally inadequate vein for fistula both arms   Assessment/Planning:  Pt needs long term dialysis access.  She will be high risk for placement of graft/catheter due to her underlying coagulapathy/malnutrition/thrombocytopenia due to her liver dysfunction.  Will transfuse FFP and platelets perioperatively as suggested by Dr Beryle Beams.  Please DO NOT DIALYZE PATIENT TOMORROW UNTIL AFTER HER SURGICAL PROCEDURE DUE TO THE FACT SHE IS GOING TO RECEIVE SIGNIFICANT VOLUME WITH FFP PLATELETS ANESTHESIA IV FLUIDS  Procedure will by done by my partner Dr Bridgett Larsson who will follow up on her clotting factor studies preop tomorrow post transfusion.   Risk, benefits, and alternatives to access surgery were discussed.  The patient is aware the risks include but are not limited to: bleeding, infection, steal syndrome, nerve damage, ischemic neuropathy, failure to mature, and need for additional procedures.  The patient agrees to proceed.   Ruta Hinds 10/21/2014 1:09 PM --

## 2014-10-22 NOTE — Progress Notes (Signed)
Subjective:    The patient reports doing well this morning with no complaints.  Interval Events: -Afebrile, vital signs stable. -Dialysis yesterday with some low blood pressure during, even volume.    Objective:    Vital Signs:   Temp:  [97.9 F (36.6 C)-98.7 F (37.1 C)] 97.9 F (36.6 C) (05/19 0649) Pulse Rate:  [84-97] 90 (05/19 0649) Resp:  [16-20] 18 (05/19 0649) BP: (100-126)/(75-97) 120/78 mmHg (05/19 0649) SpO2:  [95 %-100 %] 100 % (05/19 0649) Weight:  [93 lb 4.1 oz (42.3 kg)] 93 lb 4.1 oz (42.3 kg) (05/18 1900) Last BM Date: 10/21/14  24-hour weight change: Weight change: -7 lb 15.1 oz (-3.604 kg)  Intake/Output:   Intake/Output Summary (Last 24 hours) at 10/22/14 0754 Last data filed at 10/22/14 0200  Gross per 24 hour  Intake   1080 ml  Output      0 ml  Net   1080 ml      Physical Exam: General: Thin, cachectic appearing, alert, in no acute distress.  Lungs:  Clear to auscultation bilaterally, no wheezing, rales or rhonchi.   Heart: Regular rate and rhythm, S1 and S2 normal without gallop, murmur, or rubs.  Abdomen:  BS normoactive, nontender, nondistended.  Extremities: No pretibial edema, no tenderness to palpation.     Labs:  Basic Metabolic Panel:  Recent Labs Lab 10/16/14 0217 10/17/14 0319 10/17/14 0925 10/19/14 0654 10/20/14 0353 10/21/14 1600  NA 135 135 134* 135 135 134*  K 3.8 3.8 3.6 3.8 4.1 4.3  CL 102 101 101 101 103 101  CO2 25 26 29 30 25 28   GLUCOSE 172* 115* 188* 99 92 120*  BUN 10 <5* <5* <5* <5* 6  CREATININE 2.79* 1.75* 1.87* 2.33* 1.43* 2.54*  CALCIUM 6.7* 7.1* 7.1* 7.4* 7.6* 7.6*  MG 1.8 1.7  --  1.5* 2.0  --   PHOS 2.6 1.6* 1.7* 3.5 2.6 2.8    Liver Function Tests:  Recent Labs Lab 10/17/14 0925 10/19/14 0654 10/19/14 1558 10/20/14 0353 10/21/14 1600  AST  --   --  56*  --   --   ALT  --   --  21  --   --   ALKPHOS  --   --  311*  --   --   BILITOT  --   --  0.8  --   --   PROT  --   --  8.1  --    --   ALBUMIN 1.0* 1.2* 1.3* 1.2* 1.2*   CBC:  Recent Labs Lab 10/17/14 0319 10/17/14 0925 10/19/14 0654 10/20/14 0353 10/21/14 1600  WBC 6.8 6.6 5.9 5.9 4.8  HGB 8.7* 8.0* 8.2* 9.4* 12.6  HCT 26.5* 25.0* 25.9* 29.6* 40.3  MCV 90.1 90.6 92.8 93.4 94.4  PLT 42* 37* 42* 46* 36*    Cardiac Enzymes: No results for input(s): CKTOTAL, CKMB, CKMBINDEX, TROPONINI in the last 168 hours. Iron/TIBC/Ferritin/ %Sat    Component Value Date/Time   IRON 120 10/20/2014 0353   TIBC 143* 10/20/2014 0353   FERRITIN 1356* 10/20/2014 0353   IRONPCTSAT 84* 10/20/2014 0353   IRONPCTSAT NOT CALC 06/18/2014 1620   Microbiology: Results for orders placed or performed during the hospital encounter of 10/12/14  Culture, blood (routine x 2)     Status: None   Collection Time: 10/12/14 10:04 PM  Result Value Ref Range Status   Specimen Description BLOOD LEFT ANTECUBITAL  Final   Special Requests BOTTLES DRAWN AEROBIC AND  ANAEROBIC 5CC EA  Final   Culture   Final    NO GROWTH 5 DAYS Performed at [facility]    Report Status 10/19/2014 FINAL  Final  Culture, blood (routine x 2)     Status: None   Collection Time: 10/12/14 10:16 PM  Result Value Ref Range Status   Specimen Description BLOOD LEFT WRIST  Final   Special Requests BOTTLES DRAWN AEROBIC ONLY 3CC  Final   Culture   Final    NO GROWTH 5 DAYS Note: Culture results may be compromised due to an inadequate volume of blood received in culture bottles. Performed at [facility]    Report Status 10/19/2014 FINAL  Final  MRSA PCR Screening     Status: None   Collection Time: 10/12/14 11:40 PM  Result Value Ref Range Status   MRSA by PCR NEGATIVE NEGATIVE Final    Comment:        The GeneXpert MRSA Assay (FDA approved for NASAL specimens only), is one component of a comprehensive MRSA colonization surveillance program. It is not intended to diagnose MRSA infection nor to guide or monitor treatment for MRSA  infections.   Urine culture     Status: None   Collection Time: 10/13/14  4:12 AM  Result Value Ref Range Status   Specimen Description URINE, RANDOM  Final   Special Requests NONE  Final   Colony Count   Final    >=100,000 COLONIES/ML Performed at [facility]    Culture   Final    Multiple bacterial morphotypes present, none predominant. Suggest appropriate recollection if clinically indicated. Performed at [facility]    Report Status 10/14/2014 FINAL  Final  Body fluid culture     Status: None   Collection Time: 10/13/14  1:18 PM  Result Value Ref Range Status   Specimen Description FLUID RIGHT PLEURAL  Final   Special Requests NONE  Final   Gram Stain   Final    RARE WBC PRESENT, PREDOMINANTLY MONONUCLEAR NO ORGANISMS SEEN Performed at [facility]    Culture   Final    NO GROWTH 3 DAYS Performed at [facility]    Report Status 10/17/2014 FINAL  Final  MRSA PCR Screening     Status: None   Collection Time: 10/18/14  7:45 AM  Result Value Ref Range Status   MRSA by PCR NEGATIVE NEGATIVE Final    Comment:        The GeneXpert MRSA Assay (FDA approved for NASAL specimens only), is one component of a comprehensive MRSA colonization surveillance program. It is not intended to diagnose MRSA infection nor to guide or monitor treatment for MRSA infections.    Imaging: No results found.     Medications:    Infusions: . sodium chloride 10 mL/hr at 10/13/14 0131    Scheduled Medications: . sodium chloride   Intravenous Once  . antiseptic oral rinse  7 mL Mouth Rinse BID  . calcitRIOL  0.25 mcg Oral Daily  . calcium carbonate  6 tablet Oral QHS  . darbepoetin (ARANESP) injection - DIALYSIS  200 mcg Intravenous Q Wed-HD  . feeding supplement (NEPRO CARB STEADY)  237 mL Oral TID WC  . FLUoxetine  10 mg Oral Daily  . hydroxychloroquine  400 mg Oral Daily  . multivitamin  1 tablet Oral QHS  . ondansetron  4 mg Intravenous Once   . predniSONE  5 mg Oral Q breakfast  . ramelteon  8 mg Oral QHS  . vitamin B-12  250 mcg Oral QPM    PRN Medications: sodium chloride, sodium chloride, camphor-menthol, feeding supplement (NEPRO CARB STEADY), heparin, lidocaine (PF), lidocaine-prilocaine, loperamide, oxyCODONE, pentafluoroprop-tetrafluoroeth   Assessment/ Plan:    Principal Problem:   End stage renal disease Active Problems:   Alcohol abuse   FSGS (focal segmental glomerulosclerosis)   Anemia of chronic disease   Lupus (systemic lupus erythematosus)   Elevated troponin   Electrolyte abnormality   Metabolic acidosis   Coagulopathy  #Volume overload secondary to ESRD Dr. Bridgett Larsson of vascular surgery to place a tunnel dialysis catheter today. He is concerned about her risk of not healing her anastomosis, so he will not place a fistula today. Instead, he will see if her liver disease can be stabilized over the next 3 months, and he can consider an upper arm graft at that time with a bovine carotid graft. CLIP process nearly completed per dialysis secretary. Some hypotension with dialysis yesterday, so no fluid off. She appears euvolemic. -Tunnel dialysis catheter placement today. -Possible dialysis and discharge tomorrow. -Finish CLIP process. -Patient will need to complete the hepatitis B series as an outpatient. -Oxygen for comfort, asked patient to try going without oxygen. -Nothing by mouth this morning for procedure.  #Alcohol liver disease She is at risk for bleeding given her low platelets and elevated INR. -Vascular surgery transfusing 2 units FFP and platelets prior to procedure today. -Would recommend giving platelets and two units of FFP prior to surgery.  #SLE Stable. She could potentially transition to NSAIDs as an outpatient. -Continue prednisone 5 mg daily. -Continue hydroxychloroquine 400 mg daily. -Patient will need an eye exam as an outpatient. -Continue oxycodone 5 mg every 4 hours when  necessary.  #Itching/dry skin -Continue Sarna lotion.  #Mixed metabolic acidosis due to ESRD Resolved. -Continue to monitor to determine if she will need oral bicarbonate as an outpatient.  #Electrolyte abnormalities -Continue to monitor.  #CKD related metabolic bone disease -Continue home calcium carbonate 1200 mg 3 times a day. -Continue home calcitriol 0.25 g daily.  #Anemia of chronic disease -Continue Aranesp.  #Prolonged QTc -Avoid QT prolonging medications.  #Hypertension Normotensive. -Will not need home amlodipine and metoprolol at discharge.  #Alcohol abuse -CIWA protocol.  #Depression -Continue home fluoxetine 10 mg daily.  #Severe malnutrition -Continue feeding supplement per nutrition recommendations. -Continue thiamine, folate, B-12.   DVT PPX - SCD's while in bed  CODE STATUS - Full.  CONSULTS PLACED - Nephrology, vascular surgery.  DISPO - possible discharge tomorrow after dialysis.  Anticipated discharge in approximately 1-2 day(s).   The patient does have a current PCP Eulas Post, Brett Canales, MD) and does need an [facility] hospital follow-up appointment after discharge.    Is the [facility] hospital follow-up appointment a one-time only appointment? no.  Does the patient have transportation limitations that hinder transportation to clinic appointments? yes   SERVICE NEEDED AT [facility]         Y = Yes, Blank = No PT: SNF  OT: SNF  RN:   Equipment:   Other:      Length of Stay: 10 day(s)   Signed: Charlesetta Shanks, MD  PGY-1, Internal Medicine Resident Pager: [phone] (7AM-5PM) 10/22/2014, 7:54 AM

## 2014-10-22 NOTE — Interval H&P Note (Signed)
[facility] [place]  History and Physical Update  The patient was interviewed and re-examined.  The patient's previous History and Physical has been reviewed and is unchanged from Dr. Nona Dell consult.  I discussed with this patient proceeding with the temporary to tunneled dialysis catheter exchange.  I also discussed my concerns with proceed with arteriovenous graft placement.  The patient has agreed to defer arteriovenous graft placement at this time.   Adele Barthel, MD [facility] [place] Office: [phone] Pager: [phone]  10/22/2014, 10:33 AM

## 2014-10-22 NOTE — Progress Notes (Signed)
   Preoperative Note   Procedure: tunneled dialysis catheter exchange  Date: 10/22/2014   Preoperative diagnosis: cirrhosis, ESRD  Consent: on chart (will finalize in holding)  Laboratory:  CBC:    Component Value Date/Time   WBC 4.8 10/21/2014 1600   WBC 7.2 10/26/2011 1336   RBC 4.27 10/21/2014 1600   RBC 2.54* 06/18/2014 1620   RBC 2.98* 10/26/2011 1336   HGB 12.6 10/21/2014 1600   HGB 9.7* 10/26/2011 1336   HCT 40.3 10/21/2014 1600   HCT 28.8* 10/26/2011 1336   PLT 36* 10/21/2014 1600   PLT 125* 10/26/2011 1336   MCV 94.4 10/21/2014 1600   MCV 96.6 10/26/2011 1336   MCH 29.5 10/21/2014 1600   MCH 32.6 10/26/2011 1336   MCHC 31.3 10/21/2014 1600   MCHC 33.7 10/26/2011 1336   RDW 19.5* 10/21/2014 1600   RDW 13.6 10/26/2011 1336   LYMPHSABS 1.8 09/30/2014 1013   LYMPHSABS 2.7 10/26/2011 1336   MONOABS 0.6 09/30/2014 1013   MONOABS 0.8 10/26/2011 1336   EOSABS 0.0 09/30/2014 1013   EOSABS 0.1 10/26/2011 1336   BASOSABS 0.2* 09/30/2014 1013   BASOSABS 0.0 10/26/2011 1336    BMP:    Component Value Date/Time   NA 134* 10/21/2014 1600   K 4.3 10/21/2014 1600   CL 101 10/21/2014 1600   CO2 28 10/21/2014 1600   GLUCOSE 120* 10/21/2014 1600   BUN 6 10/21/2014 1600   CREATININE 2.54* 10/21/2014 1600   CREATININE 4.40* 10/08/2014 1018   CALCIUM 7.6* 10/21/2014 1600   CALCIUM 4.0* 09/17/2014 2048   GFRNONAA 20* 10/21/2014 1600   GFRNONAA 14* 09/30/2014 1013   GFRAA 23* 10/21/2014 1600   GFRAA 16* 09/30/2014 1013    Coagulation: Lab Results  Component Value Date   INR 1.86* 10/19/2014   INR 2.13* 10/13/2014   INR 1.63* 09/20/2014   No results found for: PTT  In my opinion, this patient is at high risk of NOT healing her anastomoses given her poor protein synthesis.  If she fails to heal her exposure incisions, this will result in rupture of the anastomoses leading to massive bleeding leading to death.    - I would exchange the [facility] today, see if her  cirrhosis can be stabilized any further over another 3 months and then consider an upper arm graft with the bovine carotid graft in 3 months if her protein synthesis function is improved  - In my experience, the anastomoses completed with the carotid graft are more water tight than with prosthetic.  However, it would also be vulnerable to poor healing.   Adele Barthel, MD [facility] [place] Office: [phone] Pager: [phone]  10/22/2014, 7:20 AM

## 2014-10-22 NOTE — Op Note (Signed)
OPERATIVE NOTE  PROCEDURE: 1. Right internal jugular vein tunneled dialysis catheter placement 2. Right internal jugular vein cannulation under ultrasound guidance 3. Removal of temporary dialysis catheter  PRE-OPERATIVE DIAGNOSIS: end-stage renal failure  POST-OPERATIVE DIAGNOSIS: same as above  SURGEON: Adele Barthel, MD  ANESTHESIA: local and MAC  ESTIMATED BLOOD LOSS: 30 cc  FINDING(S): 1.  Tips of the catheter in the right atrium on fluoroscopy 2.  No obvious pneumothorax on fluoroscopy  SPECIMEN(S):  none  INDICATIONS:   [name] [name] is a 59 y.o. female who presents with end stage renal disease.  The patient presents for tunneled dialysis catheter placement.  The patient is aware the risks of tunneled dialysis catheter placement include but are not limited to: bleeding, infection, central venous injury, pneumothorax, possible venous stenosis, possible malpositioning in the venous system, and possible infections related to long-term catheter presence.  The patient was aware of these risks and agreed to proceed.  DESCRIPTION: After written full informed consent was obtained from the patient, the patient was taken back to the operating room.  Prior to induction, the patient was given IV antibiotics.  After obtaining adequate sedation, the patient was prepped and draped in the standard fashion for a chest or neck tunneled dialysis catheter placement.   The cannulation site, the catheter exit site, and tract for the subcutaneous tunnel were then anesthestized with a total of 20 cc of a 1:1 mixture of 0.5% Marcaine without epinepherine and 1% Lidocaine with epinepherine.  Under ultrasound guidance, the right internal jugular vein was cannulated with the 18 gauge needle.  A J-wire was then placed down into the inferior vena cava under fluoroscopic guidance.  The wire was then secured in place with a clamp to the drapes.  I then made stab incisions at the neck and exit sites.   I  dissected from the exit site to the cannulation site with a metal tunneler.   The subcutaneous tunnel was dilated by passing a plastic dilator over the metal dissector. The wire was then unclamped and I removed the needle.  The skin tract and venotomy was dilated serially with dilators.  Finally, the dilator-sheath was placed under fluoroscopic guidance into the superior vena cava.  The dilator and wire were removed.  A 23 cm Diatek catheter was placed under fluoroscopic guidance down into the right atrium.  The sheath was broken and peeled away while holding the catheter cuff at the level of the skin.  The back end of this catheter was transected, revealing the two lumens of this catheter.  The ports were docked onto these two lumens.  The catheter hub was then screwed into place.  Each port was tested by aspirating and flushing.  No resistance was noted.  Each port was then thoroughly flushed with heparinized saline.  The catheter was secured in placed with two interrupted stitches of 3-0 Nylon tied to the catheter.  The neck incision was closed with a U-stitch of 4-0 Monocryl.  The neck and chest incision were cleaned and sterile bandages applied.  Each port was then loaded with concentrated heparin (1000 Units/mL) at the manufacturer recommended volumes to each port.  Sterile caps were applied to each port.  On completion fluoroscopy, the tips of the catheter were in the right atrium, and there was no evidence of pneumothorax.  COMPLICATIONS: none  CONDITION: stable   Adele Barthel, MD [facility] [place] Office: [phone] Pager: [phone]  10/22/2014, 12:05 PM

## 2014-10-22 NOTE — Progress Notes (Signed)
LCSW following for disposition. LTACH was consulted, but due to patient's insurance, patient does not qualify at this time. LCSW is working to understand if patient can have a temp. Cath in the outpatient setting and DC to SNF at [place].  Will follow up once more is known.  Lane Hacker, MSW Clinical Social Work: Emergency Room [phone]

## 2014-10-22 NOTE — Progress Notes (Signed)
Pt at test on two attempts to treat pt today.  Will check back as schedule allows. Jinger Neighbors, [phone]

## 2014-10-22 NOTE — Progress Notes (Signed)
PT Cancellation Note  Patient Details Name: [name] [name] MRN: [id number] DOB: 1956-05-15   Cancelled Treatment:    Reason Eval/Treat Not Completed: Other (comment) (RN care)  Attempted to see pt this AM (67), but RN providing bedside care in preparation for sx later today. Will attempt to see pt again before surgery as schedule allows.   Canary Brim Ivory Broad, PT, DPT [phone] 10/22/2014, 10:17 AM

## 2014-10-22 NOTE — Progress Notes (Signed)
[name], female   DOB: 17-Sep-1955, 59 y.o.   MRN: [id number] Medicine attending: I examined this patient this morning together with resident physician Dr. Karle Starch Moding and I concur with his evaluation and management plan. We appreciate vascular surgery input. Patient felt to be at high risk for a vascular graft due to her overall poor nutritional status and coagulopathy. She will have removal of the internal jugular vascular access catheter today and replacement with a tunneled vascular catheter. Vascular access graft/AV fistula will be deferred with reevaluation in 3 months. If she is otherwise stable after the procedure this afternoon, we will discharge her after her dialysis treatment tomorrow. Renal function stable on dialysis. Potassium 3.8. Bicarbonate 24. Albumin remains significantly decreased at 1.6 g percent. Hemoglobin down from 12.6-7.6 g without any obvious clinical bleeding.

## 2014-10-23 DIAGNOSIS — G4700 Insomnia, unspecified: Secondary | ICD-10-CM

## 2014-10-23 LAB — RENAL FUNCTION PANEL
ALBUMIN: 1.6 g/dL — AB (ref 3.5–5.0)
Anion gap: 8 (ref 5–15)
BUN: 5 mg/dL — AB (ref 6–20)
CO2: 26 mmol/L (ref 22–32)
CREATININE: 2.41 mg/dL — AB (ref 0.44–1.00)
Calcium: 8 mg/dL — ABNORMAL LOW (ref 8.9–10.3)
Chloride: 101 mmol/L (ref 101–111)
GFR calc Af Amer: 25 mL/min — ABNORMAL LOW (ref >60)
GFR calc non Af Amer: 21 mL/min — ABNORMAL LOW (ref >60)
Glucose, Bld: 66 mg/dL (ref 65–99)
PHOSPHORUS: 3.1 mg/dL (ref 2.5–4.6)
POTASSIUM: 3.6 mmol/L (ref 3.5–5.1)
Sodium: 135 mmol/L (ref 135–145)

## 2014-10-23 LAB — CBC
HCT: 24.7 % — ABNORMAL LOW (ref 36.0–46.0)
Hemoglobin: 7.6 g/dL — ABNORMAL LOW (ref 12.0–15.0)
MCH: 29.6 pg (ref 26.0–34.0)
MCHC: 30.8 g/dL (ref 30.0–36.0)
MCV: 96.1 fL (ref 78.0–100.0)
Platelets: 97 10*3/uL — ABNORMAL LOW (ref 150–400)
RBC: 2.57 MIL/uL — ABNORMAL LOW (ref 3.87–5.11)
RDW: 19.7 % — ABNORMAL HIGH (ref 11.5–15.5)
WBC: 5.9 10*3/uL (ref 4.0–10.5)

## 2014-10-23 LAB — PREPARE FRESH FROZEN PLASMA
UNIT DIVISION: 0
Unit division: 0
Unit division: 0

## 2014-10-23 LAB — PREPARE PLATELET PHERESIS: Unit division: 0

## 2014-10-23 NOTE — Care Management Note (Signed)
Case Management Note  Patient Details  Name: [name] [name] MRN: [id number] Date of Birth: Jun 09, 1955  Subjective/Objective:                 CM following for progression and d/c planning.   Action/Plan: 10/23/2014 Ongoing efforts to finalize d/c plans for this pt. Per nephrologist this pt can not receive Hemodialysis in the community until per access is placed. Per Vascular this pt is a surgical risk due to nutritional status and must receive nutritions support before they can place perm access. Attending MD had been request LTAC, however, this pt does not meet criteria for LTAC per [facility] facilities as she has not acute care needs. CSW continues to work with this pt and the possibility of d/c to [facility] for SNF care and transport to [facility] via tunnel for HD treatments until access can be placed.   Expected Discharge Date:                  Expected Discharge Plan:  Skilled Nursing Facility  In-House Referral:  Clinical Social Work  Discharge planning Services  CM Consult  Post Acute Care Choice:    Choice offered to:     DME Arranged:    DME Agency:     HH Arranged:    [place] Agency:     Status of Service:  In process, will continue to follow  Medicare Important Message Given:  Yes Date Medicare IM Given:  10/15/14 Medicare IM give by:  debbie dowell rn,bsn Date Additional Medicare IM Given:  10/21/14 Additional Medicare Important Message give by:  Jasmine Pang RN MPH, case management [phone]  If discussed at Long Length of Stay Meetings, dates discussed:    Additional Comments:  Adron Bene, RN 10/23/2014, 4:33 PM

## 2014-10-23 NOTE — Progress Notes (Signed)
Physical Therapy Treatment Patient Details Name: [name] [name] MRN: [id number] DOB: 12-Jul-1955 Today's Date: 10/23/2014    History of Present Illness Pt admitted with dyspnea and inability to take medications due to vomiting.  Pt also reports diarrhea. Now s/p thoracentesis and placement of central venous HD catheter. Pt discharged last week with HCAP. PMH: CKD, CHF, Lupus, pericardial window, ETOH window, HTN, depression.    PT Comments    Pt agreed to walk with encouragement.  Pt walked in hall with RW and 2L O2. Her legs got tired but no concerns,.  She was educated on breathing technques with activity.  She would benefit from walking with nursing over the weekend.  Follow Up Recommendations  SNF;Supervision/Assistance - 24 hour     Equipment Recommendations  None recommended by PT    Recommendations for Other Services       Precautions / Restrictions Precautions Precautions: Fall Restrictions Weight Bearing Restrictions: No    Mobility  Bed Mobility Overal bed mobility: Modified Independent Bed Mobility: Sidelying to Sit           General bed mobility comments: Used bed rails; HOB elevated  Transfers Overall transfer level: Needs assistance Equipment used: Rolling walker (2 wheeled) Transfers: Sit to/from Stand Sit to Stand: Supervision         General transfer comment: verbal cues for hand placement  Ambulation/Gait Ambulation/Gait assistance: Min guard Ambulation Distance (Feet): 140 Feet Assistive device: Rolling walker (2 wheeled) Gait Pattern/deviations: Step-through pattern;Trunk flexed     General Gait Details: No gross loss of balance with bil UE support.  Pt still weak and cannot withstand challenges to balance.  Used cane PTA therefore is not at baseline.  Pt reprots her legs got tired wth walking   Stairs            Wheelchair Mobility    Modified Rankin (Stroke Patients Only)       Balance                                     Cognition Arousal/Alertness: Awake/alert Behavior During Therapy: WFL for tasks assessed/performed Overall Cognitive Status: Within Functional Limits for tasks assessed                      Exercises      General Comments        Pertinent Vitals/Pain Pain Assessment: No/denies pain    Home Living                      Prior Function            PT Goals (current goals can now be found in the care plan section) Progress towards PT goals: Progressing toward goals    Frequency  Min 3X/week    PT Plan Current plan remains appropriate    Co-evaluation             End of Session Equipment Utilized During Treatment: Gait belt;Oxygen Activity Tolerance: Patient limited by fatigue Patient left: in chair;with call bell/phone within reach;with family/visitor present     Time: 1525-1540 PT Time Calculation (min) (ACUTE ONLY): 15 min  Charges:  $Gait Training: 8-22 mins                    G Codes:      Loyal Buba 10/23/2014, 3:51 PM  Rande Lawman,  PT

## 2014-10-23 NOTE — Progress Notes (Signed)
[name], female   DOB: May 03, 1956, 59 y.o.   MRN: [id number] Medicine attending: I personally examined this patient this morning together with resident physician Dr. Karle Starch Moding and discussed management with the healthcare team. I concur with the evaluation and management plan as courted by Dr. Trudee Kuster. We are in a difficult situation. The patient is not felt to be an acceptable surgical candidate to place an AV fistula for ongoing outpatient dialysis. A tunneled catheter was placed yesterday but apparently this is not suitable for outpatient dialysis in a freestanding facility outside of the hospital. It is also not nonacceptable catheter for patient to have if they go into a extended care facility. I don't know all of the rules but apparently a vascular access catheter is not acceptable at an LTACH. It is unrealistic to expect that we will be able to improve on her nutritional status in a way that will impact on her albumin since her albumin is decreased secondary to impaired hepatic synthetic function from cirrhosis.  We are looking into other possibilities. There is an implantable Port-A-Cath infusion device called the Vortex port which  is able to handle high flow rates and has been used in some patients for red cell exchange transfusion. We will research whether or not this device would be suitable for dialysis. In the interim, we have no alternative but to keep the patient in the hospital. Our care management team is exploring the LTACH issue.

## 2014-10-23 NOTE — Progress Notes (Signed)
Subjective:    The patient reports doing well this morning with shortness of breath. She had some pain this morning that is now resolved.  Interval Events: -Afebrile, vital signs stable. -Per social work, patient is unable to go to [facility] because insurance will not pay. -Vascular surgery unwilling to place a graft or fistula. -Nephrology unwilling to accept to outpatient dialysis with tunneled dialysis catheter.    Objective:    Vital Signs:   Temp:  [97.5 F (36.4 C)-98.2 F (36.8 C)] 97.8 F (36.6 C) (05/20 0649) Pulse Rate:  [80-93] 80 (05/20 1030) Resp:  [12-18] 16 (05/20 0730) BP: (98-142)/(72-94) 120/80 mmHg (05/20 1030) SpO2:  [93 %-100 %] 99 % (05/20 0649) Weight:  [92 lb 9.5 oz (42 kg)-97 lb 3.6 oz (44.1 kg)] 92 lb 9.5 oz (42 kg) (05/20 0649) Last BM Date: 10/22/14  24-hour weight change: Weight change: 3 lb 7.1 oz (1.563 kg)  Intake/Output:   Intake/Output Summary (Last 24 hours) at 10/23/14 1050 Last data filed at 10/23/14 0600  Gross per 24 hour  Intake   1136 ml  Output      0 ml  Net   1136 ml      Physical Exam: General: Thin, cachectic appearing, alert, in no acute distress.  Lungs:  Clear to auscultation bilaterally, no wheezing, rales or rhonchi.   Heart: Regular rate and rhythm, S1 and S2 normal without gallop, murmur, or rubs.  Abdomen:  BS normoactive, nontender, nondistended.  Extremities: No pretibial edema, no tenderness to palpation.     Labs:  Basic Metabolic Panel:  Recent Labs Lab 10/17/14 0319  10/19/14 0654 10/20/14 0353 10/21/14 1600 10/22/14 0944 10/22/14 1108 10/23/14 0714  NA 135  < > 135 135 134* 136 138 135  K 3.8  < > 3.8 4.1 4.3 3.8 3.6 3.6  CL 101  < > 101 103 101 105  --  101  CO2 26  < > 30 25 28 24   --  26  GLUCOSE 115*  < > 99 92 120* 73 73 66  BUN <5*  < > <5* <5* 6 <5*  --  5*  CREATININE 1.75*  < > 2.33* 1.43* 2.54* 1.75*  --  2.41*  CALCIUM 7.1*  < > 7.4* 7.6* 7.6* 7.9*  --  8.0*  MG 1.7  --  1.5*  2.0  --  1.7  --   --   PHOS 1.6*  < > 3.5 2.6 2.8 2.1*  --  3.1  < > = values in this interval not displayed.  Liver Function Tests:  Recent Labs Lab 10/19/14 1558 10/20/14 0353 10/21/14 1600 10/22/14 0944 10/23/14 0714  AST 56*  --   --   --   --   ALT 21  --   --   --   --   ALKPHOS 311*  --   --   --   --   BILITOT 0.8  --   --   --   --   PROT 8.1  --   --   --   --   ALBUMIN 1.3* 1.2* 1.2* 1.6* 1.6*   CBC:  Recent Labs Lab 10/19/14 0654 10/20/14 0353 10/21/14 1600 10/22/14 0944 10/22/14 1108 10/23/14 0713  WBC 5.9 5.9 4.8 4.4  --  5.9  HGB 8.2* 9.4* 12.6 7.6* 8.8* 7.6*  HCT 25.9* 29.6* 40.3 24.3* 26.0* 24.7*  MCV 92.8 93.4 94.4 94.2  --  96.1  PLT 42* 46* 36*  45*  --  97*    Iron/TIBC/Ferritin/ %Sat    Component Value Date/Time   IRON 120 10/20/2014 0353   TIBC 143* 10/20/2014 0353   FERRITIN 1356* 10/20/2014 0353   IRONPCTSAT 84* 10/20/2014 0353   IRONPCTSAT NOT CALC 06/18/2014 1620   Microbiology: Results for orders placed or performed during the hospital encounter of 10/12/14  Culture, blood (routine x 2)     Status: None   Collection Time: 10/12/14 10:04 PM  Result Value Ref Range Status   Specimen Description BLOOD LEFT ANTECUBITAL  Final   Special Requests BOTTLES DRAWN AEROBIC AND ANAEROBIC 5CC EA  Final   Culture   Final    NO GROWTH 5 DAYS Performed at [facility]    Report Status 10/19/2014 FINAL  Final  Culture, blood (routine x 2)     Status: None   Collection Time: 10/12/14 10:16 PM  Result Value Ref Range Status   Specimen Description BLOOD LEFT WRIST  Final   Special Requests BOTTLES DRAWN AEROBIC ONLY 3CC  Final   Culture   Final    NO GROWTH 5 DAYS Note: Culture results may be compromised due to an inadequate volume of blood received in culture bottles. Performed at [facility]    Report Status 10/19/2014 FINAL  Final  MRSA PCR Screening     Status: None   Collection Time: 10/12/14 11:40 PM  Result Value  Ref Range Status   MRSA by PCR NEGATIVE NEGATIVE Final    Comment:        The GeneXpert MRSA Assay (FDA approved for NASAL specimens only), is one component of a comprehensive MRSA colonization surveillance program. It is not intended to diagnose MRSA infection nor to guide or monitor treatment for MRSA infections.   Urine culture     Status: None   Collection Time: 10/13/14  4:12 AM  Result Value Ref Range Status   Specimen Description URINE, RANDOM  Final   Special Requests NONE  Final   Colony Count   Final    >=100,000 COLONIES/ML Performed at [facility]    Culture   Final    Multiple bacterial morphotypes present, none predominant. Suggest appropriate recollection if clinically indicated. Performed at [facility]    Report Status 10/14/2014 FINAL  Final  Body fluid culture     Status: None   Collection Time: 10/13/14  1:18 PM  Result Value Ref Range Status   Specimen Description FLUID RIGHT PLEURAL  Final   Special Requests NONE  Final   Gram Stain   Final    RARE WBC PRESENT, PREDOMINANTLY MONONUCLEAR NO ORGANISMS SEEN Performed at [facility]    Culture   Final    NO GROWTH 3 DAYS Performed at [facility]    Report Status 10/17/2014 FINAL  Final  MRSA PCR Screening     Status: None   Collection Time: 10/18/14  7:45 AM  Result Value Ref Range Status   MRSA by PCR NEGATIVE NEGATIVE Final    Comment:        The GeneXpert MRSA Assay (FDA approved for NASAL specimens only), is one component of a comprehensive MRSA colonization surveillance program. It is not intended to diagnose MRSA infection nor to guide or monitor treatment for MRSA infections.    Imaging: Dg Chest Port 1 View  10/22/2014   CLINICAL DATA:  Dialysis catheter placement.  EXAM: PORTABLE CHEST - 1 VIEW  COMPARISON:  10/14/2014.  FINDINGS:  The RIGHT IJ vas cath has been exchange for a split dialysis catheter. The distal tip is in the RIGHT atrium.   Findings of volume overload are present with cardiomegaly and interstitial and alveolar edema.  IMPRESSION: 1. Uncomplicated RIGHT IJ dialysis catheter placement. No pneumothorax. 2. Moderate CHF/volume overload.   Electronically Signed   By: Dereck Ligas M.D.   On: 10/22/2014 13:16       Medications:    Infusions: . sodium chloride Stopped (10/22/14 1215)    Scheduled Medications: . sodium chloride   Intravenous Once  . antiseptic oral rinse  7 mL Mouth Rinse BID  . calcitRIOL  0.25 mcg Oral Daily  . calcium carbonate  6 tablet Oral QHS  . darbepoetin (ARANESP) injection - DIALYSIS  200 mcg Intravenous Q Wed-HD  . feeding supplement (NEPRO CARB STEADY)  237 mL Oral TID WC  . FLUoxetine  10 mg Oral Daily  . hydroxychloroquine  400 mg Oral Daily  . multivitamin  1 tablet Oral QHS  . predniSONE  5 mg Oral Q breakfast  . ramelteon  8 mg Oral QHS  . vitamin B-12  250 mcg Oral QPM    PRN Medications: sodium chloride, sodium chloride, camphor-menthol, feeding supplement (NEPRO CARB STEADY), heparin, lidocaine (PF), lidocaine-prilocaine, loperamide, oxyCODONE, pentafluoroprop-tetrafluoroeth   Assessment/ Plan:    Principal Problem:   End stage renal disease Active Problems:   Alcohol abuse   FSGS (focal segmental glomerulosclerosis)   Anemia of chronic disease   Lupus (systemic lupus erythematosus)   Elevated troponin   Electrolyte abnormality   Metabolic acidosis   Coagulopathy  #Volume overload secondary to ESRD Placement issue currently with no long-term access. Unable to go to [facility], unable to get graft, unable to get outpatient dialysis. Tunnel dialysis catheter placed yesterday, receiving dialysis this morning without issue. -Continue inpatient dialysis. -Continue to look for options for placement and on term access. -Patient will need to complete the hepatitis B series. -Oxygen for comfort, asked patient to try going without oxygen. -Renal diet.  #Alcoholic  liver disease Albumin inlikely to improve in future because this is related to her liver. -Continue to work with PT. -Nutritional supplements as below.  #SLE Stable. -Continue prednisone 5 mg daily. -Consider switching to NSAIDs. -Continue hydroxychloroquine 400 mg daily. -Patient will need an eye exam in the next year. -Continue oxycodone 5 mg every 4 hours when necessary.  #Itching/dry skin -Continue Sarna lotion.  #Mixed metabolic acidosis due to ESRD Resolved. -Continue to monitor to determine if she will need oral bicarbonate as an outpatient.  #Electrolyte abnormalities -Continue to monitor.  #CKD related metabolic bone disease -Continue home calcium carbonate 1200 mg 3 times a day. -Continue home calcitriol 0.25 g daily.  #Anemia of chronic disease -Continue Aranesp.  #Prolonged QTc -Avoid QT prolonging medications.  #Hypertension Normotensive. -Will not need home amlodipine and metoprolol at discharge.  #Alcohol abuse No risk for withdrawal currently. -Stop CIWA protocol.  #Depression -Continue home fluoxetine 10 mg daily.  #Severe malnutrition -Continue feeding supplement per nutrition recommendations. -Continue thiamine, folate, B-12.  #Insomnia -Continue ramelteon 8 mg daily at bedtime.   DVT PPX - SCD's while in bed  CODE STATUS - Full.  CONSULTS PLACED - Nephrology, vascular surgery.  DISPO - patient will need to be hospitalized indefinitely for dialysis until long-term access can be obtained or she can go to an LTACH.  The patient does have a current PCP Eulas Post, Brett Canales, MD) and does need an [facility] hospital follow-up  appointment after discharge.    Is the [facility] hospital follow-up appointment a one-time only appointment? no.  Does the patient have transportation limitations that hinder transportation to clinic appointments? yes   SERVICE NEEDED AT [place]         Y = Yes, Blank = No PT: SNF    OT: SNF  RN:   Equipment:   Other:      Length of Stay: 11 day(s)   Signed: Charlesetta Shanks, MD  PGY-1, Internal Medicine Resident Pager: [phone] (7AM-5PM) 10/23/2014, 10:50 AM

## 2014-10-23 NOTE — Progress Notes (Signed)
Subjective: Interval History: has no complaint , sleepy this am.  Objective: Vital signs in last 24 hours: Temp:  [97.5 F (36.4 C)-98.4 F (36.9 C)] 97.8 F (36.6 C) (05/20 0649) Pulse Rate:  [85-93] 86 (05/20 0800) Resp:  [12-18] 16 (05/20 0730) BP: (98-142)/(72-94) 114/84 mmHg (05/20 0800) SpO2:  [93 %-100 %] 99 % (05/20 0649) Weight:  [42 kg (92 lb 9.5 oz)-44.1 kg (97 lb 3.6 oz)] 42 kg (92 lb 9.5 oz) (05/20 0649) Weight change: 1.563 kg (3 lb 7.1 oz)  Intake/Output from previous day: 05/19 0701 - 05/20 0700 In: 1660 [P.O.:600; I.V.:100; Blood:960] Out: 0  Intake/Output this shift:    General appearance: cooperative, cachectic and no distress Resp: diminished breath sounds bilaterally and rales bibasilar Chest wall: RIJ cath Cardio: S1, S2 normal and systolic murmur: holosystolic 2/6, blowing at apex GI: pos bs, soft,mild distension Extremities: edema 1+  Lab Results:  Recent Labs  10/22/14 0944 10/22/14 1108 10/23/14 0713  WBC 4.4  --  5.9  HGB 7.6* 8.8* 7.6*  HCT 24.3* 26.0* 24.7*  PLT 45*  --  97*   BMET:  Recent Labs  10/22/14 0944 10/22/14 1108 10/23/14 0714  NA 136 138 135  K 3.8 3.6 3.6  CL 105  --  101  CO2 24  --  26  GLUCOSE 73 73 66  BUN <5*  --  5*  CREATININE 1.75*  --  2.41*  CALCIUM 7.9*  --  8.0*   No results for input(s): PTH in the last 72 hours. Iron Studies: No results for input(s): IRON, TIBC, TRANSFERRIN, FERRITIN in the last 72 hours.  Studies/Results: Dg Chest Port 1 View  10/22/2014   CLINICAL DATA:  Dialysis catheter placement.  EXAM: PORTABLE CHEST - 1 VIEW  COMPARISON:  10/14/2014.  FINDINGS: The RIGHT IJ vas cath has been exchange for a split dialysis catheter. The distal tip is in the RIGHT atrium.  Findings of volume overload are present with cardiomegaly and interstitial and alveolar edema.  IMPRESSION: 1. Uncomplicated RIGHT IJ dialysis catheter placement. No pneumothorax. 2. Moderate CHF/volume overload.    Electronically Signed   By: Dereck Ligas M.D.   On: 10/22/2014 13:16    I have reviewed the patient's current medications.  Assessment/Plan: 1  ESRD  On HD .  With her comorbidities, adding another risk of no perm access and having catheter, risk of outpatient dialysis prohibitive with high mortality 1st 90 d.  This is one of reasons permanent access is a quality parameter  Recommend LTAC , and after 45 d, get perm access and hopefully will qualify. 2 Cirrhosis 3 Coagulopathy 4 malnutrition 5 Anemia 6 HPTH P HD, Rec LTAC    LOS: 11 days   Courtenay Creger L 10/23/2014,9:28 AM

## 2014-10-23 NOTE — Progress Notes (Signed)
LCSW follow for disposition SNF, LTACH, or other options.  LCSW has reviewed notes and patient again does not qualify for an LTACH at this time due to insurance. There are [facility] beds [facility] and LCSW will call MD Reynaldo Minium (medical director to see if she qualifies) The other option LCSW proposes if approved by MD Reynaldo Minium is a SNF bed at [facility] with patient doing inpatient dialysis at [facility] (rolling her though a tunnel) Again this will have to be reviewed with all medical team and approved by MD Reynaldo Minium.   Call placed this morning to medical director and message left and will follow up with any options once call returned.  Lane Hacker, MSW Clinical Social Work: Emergency Room [phone]

## 2014-10-23 NOTE — Procedures (Signed)
I was present at this session.  I have reviewed the session itself and made appropriate changes.  BP low 100s.  Cath flow 400 cc/min  Che Rachal L 5/20/20169:24 AM

## 2014-10-23 NOTE — Care Management Note (Signed)
Case Management Note  Patient Details  Name: [name] [name] MRN: [id number] Date of Birth: Oct 06, 1955  Subjective/Objective:           CM following for progression and d/c planning.         Action/Plan:  Ongoing efforts by CSW to place this pt however unable to place HD access and renal refusing to dialyize as outpt and vascular surgeon stating that nutritional status must improve prior to placing access. CSW working of options. Expected Discharge Date:                  Expected Discharge Plan:  Skilled Nursing Facility  In-House Referral:  Clinical Social Work  Discharge planning Services  CM Consult  Post Acute Care Choice:    Choice offered to:     DME Arranged:    DME Agency:     HH Arranged:    [place] Agency:     Status of Service:  In process, will continue to follow  Medicare Important Message Given:  Yes Date Medicare IM Given:  10/15/14 Medicare IM give by:  debbie dowell rn,bsn Date Additional Medicare IM Given:  10/21/14 Additional Medicare Important Message give by:  Jasmine Pang RN MPH, case management [phone]  If discussed at Long Length of Stay Meetings, dates discussed:    Additional Comments: 10/23/2014 This pt has been evaluated by [facility] and is not an appropriate candidate due to lack of acute needs.  Benay Pomeroy, Rory Percy, RN 10/23/2014, 11:26 AM

## 2014-10-23 NOTE — Anesthesia Postprocedure Evaluation (Signed)
  Anesthesia Post-op Note  Patient: [name] [name]  Procedure(s) Performed: Procedure(s): EXCHANGE OF A DIALYSIS CATHETER (Left)  Patient Location: PACU  Anesthesia Type:MAC  Level of Consciousness: awake  Airway and Oxygen Therapy: Patient Spontanous Breathing  Post-op Pain: none  Post-op Assessment: Post-op Vital signs reviewed, Patient's Cardiovascular Status Stable, Respiratory Function Stable, Patent Airway, No signs of Nausea or vomiting and Pain level controlled  Post-op Vital Signs: Reviewed and stable  Last Vitals:  Filed Vitals:   10/23/14 0800  BP: 114/84  Pulse: 86  Temp:   Resp:     Complications: No apparent anesthesia complications

## 2014-10-24 NOTE — Progress Notes (Signed)
Subjective: Interval History: has complaints R breast larger than L.  Objective: Vital signs in last 24 hours: Temp:  [97.6 F (36.4 C)-98.4 F (36.9 C)] 98.4 F (36.9 C) (05/21 0839) Pulse Rate:  [74-91] 91 (05/21 0839) Resp:  [16-18] 18 (05/21 0839) BP: (102-115)/(68-85) 108/76 mmHg (05/21 0839) SpO2:  [79 %-98 %] 79 % (05/21 0839) Weight:  [42.2 kg (93 lb 0.6 oz)] 42.2 kg (93 lb 0.6 oz) (05/20 2046) Weight change: -1.863 kg (-4 lb 1.7 oz)  Intake/Output from previous day: 05/20 0701 - 05/21 0700 In: 1380 [P.O.:1380] Out: 51 [Urine:50; Stool:1] Intake/Output this shift:    General appearance: cooperative, cachectic and no distress Resp: diminished breath sounds bilaterally Chest wall: RIJ cath Cardio: S1, S2 normal and systolic murmur: holosystolic 2/6, blowing at apex GI: liver down 6 cm Extremities: extremities normal, atraumatic, no cyanosis or edema  ? Scleral icterus  Lab Results:  Recent Labs  10/22/14 0944 10/22/14 1108 10/23/14 0713  WBC 4.4  --  5.9  HGB 7.6* 8.8* 7.6*  HCT 24.3* 26.0* 24.7*  PLT 45*  --  97*   BMET:  Recent Labs  10/22/14 0944 10/22/14 1108 10/23/14 0714  NA 136 138 135  K 3.8 3.6 3.6  CL 105  --  101  CO2 24  --  26  GLUCOSE 73 73 66  BUN <5*  --  5*  CREATININE 1.75*  --  2.41*  CALCIUM 7.9*  --  8.0*   No results for input(s): PTH in the last 72 hours. Iron Studies: No results for input(s): IRON, TIBC, TRANSFERRIN, FERRITIN in the last 72 hours.  Studies/Results: Dg Chest Port 1 View  10/22/2014   CLINICAL DATA:  Dialysis catheter placement.  EXAM: PORTABLE CHEST - 1 VIEW  COMPARISON:  10/14/2014.  FINDINGS: The RIGHT IJ vas cath has been exchange for a split dialysis catheter. The distal tip is in the RIGHT atrium.  Findings of volume overload are present with cardiomegaly and interstitial and alveolar edema.  IMPRESSION: 1. Uncomplicated RIGHT IJ dialysis catheter placement. No pneumothorax. 2. Moderate CHF/volume  overload.   Electronically Signed   By: Dereck Ligas M.D.   On: 10/22/2014 13:16    I have reviewed the patient's current medications.  Assessment/Plan: 1 ESRD HD MWF.  Needs perm access 2 Cirrhosis 3 HPTH on Vit D 4 Anemia varying Hb, on epo 5 Malnutrition 6 breast assym per primary P HD Mon, await access for acceptance to outpatient.    LOS: 12 days    Senn L 10/24/2014,10:49 AM

## 2014-10-24 NOTE — Progress Notes (Signed)
10/24/2014 9:34 AM  Patient requested that I examine her right breast. She stated that it felt heavier than her left breast. On exam the right breast is slightly swollen and heavier than left breast. Patient informed me that it has been this way a couple of days now and every time the RN or Md comes in she forgets to mention it. Will inform the MD about the finding. Will continue to assess and monitor the patient.   [employer], RN-BC, RN3 [facility] 6 [employer] [phone]

## 2014-10-24 NOTE — Progress Notes (Signed)
Subjective:    The patient is to do well with no complaints.  Interval Events: -Afebrile, vital signs stable. -Education officer, museum and case manager looking into possible placement options.  -Received dialysis yesterday.    Objective:    Vital Signs:   Temp:  [97.6 F (36.4 C)-98.4 F (36.9 C)] 98.4 F (36.9 C) (05/21 0839) Pulse Rate:  [74-91] 91 (05/21 0839) Resp:  [16-18] 18 (05/21 0839) BP: (102-126)/(68-90) 108/76 mmHg (05/21 0839) SpO2:  [79 %-99 %] 79 % (05/21 0839) Weight:  [92 lb 9.5 oz (42 kg)-93 lb 0.6 oz (42.2 kg)] 93 lb 0.6 oz (42.2 kg) (05/20 2046) Last BM Date: 10/24/14  24-hour weight change: Weight change: -4 lb 1.7 oz (-1.863 kg)  Intake/Output:   Intake/Output Summary (Last 24 hours) at 10/24/14 0942 Last data filed at 10/24/14 0700  Gross per 24 hour  Intake   1380 ml  Output     51 ml  Net   1329 ml      Physical Exam: General: Thin, cachectic appearing, alert, in no acute distress.  Lungs:  Clear to auscultation bilaterally, no wheezing, rales or rhonchi.   Heart: Regular rate and rhythm, S1 and S2 normal without gallop, murmur, or rubs.  Abdomen:  BS normoactive, nontender, nondistended.  Extremities: No pretibial edema, no tenderness to palpation.     Labs:  Basic Metabolic Panel:  Recent Labs Lab 10/19/14 0654 10/20/14 0353 10/21/14 1600 10/22/14 0944 10/22/14 1108 10/23/14 0714  NA 135 135 134* 136 138 135  K 3.8 4.1 4.3 3.8 3.6 3.6  CL 101 103 101 105  --  101  CO2 30 25 28 24   --  26  GLUCOSE 99 92 120* 73 73 66  BUN <5* <5* 6 <5*  --  5*  CREATININE 2.33* 1.43* 2.54* 1.75*  --  2.41*  CALCIUM 7.4* 7.6* 7.6* 7.9*  --  8.0*  MG 1.5* 2.0  --  1.7  --   --   PHOS 3.5 2.6 2.8 2.1*  --  3.1    Liver Function Tests:  Recent Labs Lab 10/19/14 1558 10/20/14 0353 10/21/14 1600 10/22/14 0944 10/23/14 0714  AST 56*  --   --   --   --   ALT 21  --   --   --   --   ALKPHOS 311*  --   --   --   --   BILITOT 0.8  --   --   --    --   PROT 8.1  --   --   --   --   ALBUMIN 1.3* 1.2* 1.2* 1.6* 1.6*   CBC:  Recent Labs Lab 10/19/14 0654 10/20/14 0353 10/21/14 1600 10/22/14 0944 10/22/14 1108 10/23/14 0713  WBC 5.9 5.9 4.8 4.4  --  5.9  HGB 8.2* 9.4* 12.6 7.6* 8.8* 7.6*  HCT 25.9* 29.6* 40.3 24.3* 26.0* 24.7*  MCV 92.8 93.4 94.4 94.2  --  96.1  PLT 42* 46* 36* 45*  --  97*    Iron/TIBC/Ferritin/ %Sat    Component Value Date/Time   IRON 120 10/20/2014 0353   TIBC 143* 10/20/2014 0353   FERRITIN 1356* 10/20/2014 0353   IRONPCTSAT 84* 10/20/2014 0353   IRONPCTSAT NOT CALC 06/18/2014 1620   Microbiology: Results for orders placed or performed during the hospital encounter of 10/12/14  Culture, blood (routine x 2)     Status: None   Collection Time: 10/12/14 10:04 PM  Result Value Ref  Range Status   Specimen Description BLOOD LEFT ANTECUBITAL  Final   Special Requests BOTTLES DRAWN AEROBIC AND ANAEROBIC 5CC EA  Final   Culture   Final    NO GROWTH 5 DAYS Performed at [facility]    Report Status 10/19/2014 FINAL  Final  Culture, blood (routine x 2)     Status: None   Collection Time: 10/12/14 10:16 PM  Result Value Ref Range Status   Specimen Description BLOOD LEFT WRIST  Final   Special Requests BOTTLES DRAWN AEROBIC ONLY 3CC  Final   Culture   Final    NO GROWTH 5 DAYS Note: Culture results may be compromised due to an inadequate volume of blood received in culture bottles. Performed at [facility]    Report Status 10/19/2014 FINAL  Final  MRSA PCR Screening     Status: None   Collection Time: 10/12/14 11:40 PM  Result Value Ref Range Status   MRSA by PCR NEGATIVE NEGATIVE Final    Comment:        The GeneXpert MRSA Assay (FDA approved for NASAL specimens only), is one component of a comprehensive MRSA colonization surveillance program. It is not intended to diagnose MRSA infection nor to guide or monitor treatment for MRSA infections.   Urine culture      Status: None   Collection Time: 10/13/14  4:12 AM  Result Value Ref Range Status   Specimen Description URINE, RANDOM  Final   Special Requests NONE  Final   Colony Count   Final    >=100,000 COLONIES/ML Performed at [facility]    Culture   Final    Multiple bacterial morphotypes present, none predominant. Suggest appropriate recollection if clinically indicated. Performed at [facility]    Report Status 10/14/2014 FINAL  Final  Body fluid culture     Status: None   Collection Time: 10/13/14  1:18 PM  Result Value Ref Range Status   Specimen Description FLUID RIGHT PLEURAL  Final   Special Requests NONE  Final   Gram Stain   Final    RARE WBC PRESENT, PREDOMINANTLY MONONUCLEAR NO ORGANISMS SEEN Performed at [facility]    Culture   Final    NO GROWTH 3 DAYS Performed at [facility]    Report Status 10/17/2014 FINAL  Final  MRSA PCR Screening     Status: None   Collection Time: 10/18/14  7:45 AM  Result Value Ref Range Status   MRSA by PCR NEGATIVE NEGATIVE Final    Comment:        The GeneXpert MRSA Assay (FDA approved for NASAL specimens only), is one component of a comprehensive MRSA colonization surveillance program. It is not intended to diagnose MRSA infection nor to guide or monitor treatment for MRSA infections.    Imaging: Dg Chest Port 1 View  10/22/2014   CLINICAL DATA:  Dialysis catheter placement.  EXAM: PORTABLE CHEST - 1 VIEW  COMPARISON:  10/14/2014.  FINDINGS: The RIGHT IJ vas cath has been exchange for a split dialysis catheter. The distal tip is in the RIGHT atrium.  Findings of volume overload are present with cardiomegaly and interstitial and alveolar edema.  IMPRESSION: 1. Uncomplicated RIGHT IJ dialysis catheter placement. No pneumothorax. 2. Moderate CHF/volume overload.   Electronically Signed   By: Dereck Ligas M.D.   On: 10/22/2014 13:16       Medications:    Infusions: . sodium chloride Stopped  (10/22/14 1215)  Scheduled Medications: . sodium chloride   Intravenous Once  . antiseptic oral rinse  7 mL Mouth Rinse BID  . calcitRIOL  0.25 mcg Oral Daily  . calcium carbonate  6 tablet Oral QHS  . darbepoetin (ARANESP) injection - DIALYSIS  200 mcg Intravenous Q Wed-HD  . feeding supplement (NEPRO CARB STEADY)  237 mL Oral TID WC  . FLUoxetine  10 mg Oral Daily  . hydroxychloroquine  400 mg Oral Daily  . multivitamin  1 tablet Oral QHS  . predniSONE  5 mg Oral Q breakfast  . ramelteon  8 mg Oral QHS  . vitamin B-12  250 mcg Oral QPM    PRN Medications: sodium chloride, sodium chloride, camphor-menthol, feeding supplement (NEPRO CARB STEADY), heparin, lidocaine (PF), lidocaine-prilocaine, loperamide, oxyCODONE, pentafluoroprop-tetrafluoroeth   Assessment/ Plan:    Principal Problem:   End stage renal disease Active Problems:   Alcohol abuse   FSGS (focal segmental glomerulosclerosis)   Anemia of chronic disease   Lupus (systemic lupus erythematosus)   Elevated troponin   Electrolyte abnormality   Metabolic acidosis   Coagulopathy  #Volume overload secondary to ESRD Trying to resolve placement issue and looking into other long-term access possibilities. -Continue inpatient dialysis. -Continue to look for options for placement and long term access. -Patient will need to complete the hepatitis B series. -Oxygen for comfort, asked patient to try going without oxygen. -Renal diet.  #Alcoholic liver disease and severe malnutrition -Continue to work with PT. -Nutritional supplements as below. -Continue feeding supplement per nutrition recommendations. -Continue thiamine, folate, B-12.  #SLE -Continue prednisone 5 mg daily. -Consider switching to NSAIDs. -Continue hydroxychloroquine 400 mg daily. -Patient will need an eye exam in the next year. -Continue oxycodone 5 mg every 4 hours when necessary.  #Itching/dry skin -Continue Sarna lotion.  #Electrolyte  abnormalities -Continue to monitor.  #CKD related metabolic bone disease -Continue home calcium carbonate 1200 mg 3 times a day. -Continue home calcitriol 0.25 g daily.  #Anemia of chronic disease -Continue Aranesp.  #Prolonged QTc -Avoid QT prolonging medications.  #Hypertension -Will not need home amlodipine and metoprolol at discharge.  #Depression -Continue home fluoxetine 10 mg daily.  #Insomnia -Continue ramelteon 8 mg daily at bedtime.   DVT PPX - SCD's while in bed  CODE STATUS - Full.  CONSULTS PLACED - Nephrology, vascular surgery.  DISPO - patient will need to be hospitalized indefinitely for dialysis until long-term access can be obtained or she can go to an LTACH.  The patient does have a current PCP Eulas Post, Brett Canales, MD) and does need an [facility] hospital follow-up appointment after discharge.    Is the [place] [facility] hospital follow-up appointment a one-time only appointment? no.  Does the patient have transportation limitations that hinder transportation to clinic appointments? yes   SERVICE NEEDED AT [place]         Y = Yes, Blank = No PT: SNF  OT: SNF  RN:   Equipment:   Other:      Length of Stay: 12 day(s)   Signed: Charlesetta Shanks, MD  PGY-1, Internal Medicine Resident Pager: [phone] (7AM-5PM) 10/24/2014, 9:42 AM

## 2014-10-25 DIAGNOSIS — Z992 Dependence on renal dialysis: Secondary | ICD-10-CM

## 2014-10-25 DIAGNOSIS — N186 End stage renal disease: Secondary | ICD-10-CM | POA: Insufficient documentation

## 2014-10-25 NOTE — Progress Notes (Signed)
[name], female   DOB: 1955-06-06, 59 y.o.   MRN: [id number] Medicine attending: I examined this patient this morning together with resident physician Dr. Karle Starch Moding and I concur with his evaluation and management plan. We appreciate ongoing input from nephrology. No acute change in patient condition. New tunneled dialysis catheter functioning well. We continue to work on disposition

## 2014-10-25 NOTE — Progress Notes (Signed)
Subjective:    The patient continues to do well with no complaints. Good appetite.  Interval Events: -Afebrile, vital signs stable.   Objective:    Vital Signs:   Temp:  [97.4 F (36.3 C)-98.2 F (36.8 C)] 98.2 F (36.8 C) (05/22 0458) Pulse Rate:  [87-89] 89 (05/22 0458) Resp:  [17-20] 17 (05/22 0458) BP: (92-119)/(62-86) 92/62 mmHg (05/22 0458) SpO2:  [94 %-100 %] 94 % (05/22 0458) Weight:  [95 lb 0.3 oz (43.1 kg)] 95 lb 0.3 oz (43.1 kg) (05/21 2114) Last BM Date: 10/24/14  24-hour weight change: Weight change: 2 lb 6.8 oz (1.1 kg)  Intake/Output:   Intake/Output Summary (Last 24 hours) at 10/25/14 1052 Last data filed at 10/25/14 0653  Gross per 24 hour  Intake    720 ml  Output      0 ml  Net    720 ml      Physical Exam: General: Thin, cachectic appearing, alert, in no acute distress.  Lungs:  Clear to auscultation bilaterally, no wheezing, rales or rhonchi.   Heart: Regular rate and rhythm, S1 and S2 normal without gallop, murmur, or rubs.  Abdomen:  BS normoactive, nontender, nondistended.  Extremities: No pretibial edema, no tenderness to palpation.     Labs:  Basic Metabolic Panel:  Recent Labs Lab 10/19/14 0654 10/20/14 0353 10/21/14 1600 10/22/14 0944 10/22/14 1108 10/23/14 0714  NA 135 135 134* 136 138 135  K 3.8 4.1 4.3 3.8 3.6 3.6  CL 101 103 101 105  --  101  CO2 30 25 28 24   --  26  GLUCOSE 99 92 120* 73 73 66  BUN <5* <5* 6 <5*  --  5*  CREATININE 2.33* 1.43* 2.54* 1.75*  --  2.41*  CALCIUM 7.4* 7.6* 7.6* 7.9*  --  8.0*  MG 1.5* 2.0  --  1.7  --   --   PHOS 3.5 2.6 2.8 2.1*  --  3.1    Liver Function Tests:  Recent Labs Lab 10/19/14 1558 10/20/14 0353 10/21/14 1600 10/22/14 0944 10/23/14 0714  AST 56*  --   --   --   --   ALT 21  --   --   --   --   ALKPHOS 311*  --   --   --   --   BILITOT 0.8  --   --   --   --   PROT 8.1  --   --   --   --   ALBUMIN 1.3* 1.2* 1.2* 1.6* 1.6*   CBC:  Recent Labs Lab  10/19/14 0654 10/20/14 0353 10/21/14 1600 10/22/14 0944 10/22/14 1108 10/23/14 0713  WBC 5.9 5.9 4.8 4.4  --  5.9  HGB 8.2* 9.4* 12.6 7.6* 8.8* 7.6*  HCT 25.9* 29.6* 40.3 24.3* 26.0* 24.7*  MCV 92.8 93.4 94.4 94.2  --  96.1  PLT 42* 46* 36* 45*  --  97*    Iron/TIBC/Ferritin/ %Sat    Component Value Date/Time   IRON 120 10/20/2014 0353   TIBC 143* 10/20/2014 0353   FERRITIN 1356* 10/20/2014 0353   IRONPCTSAT 84* 10/20/2014 0353   IRONPCTSAT NOT CALC 06/18/2014 1620   Microbiology: Results for orders placed or performed during the hospital encounter of 10/12/14  Culture, blood (routine x 2)     Status: None   Collection Time: 10/12/14 10:04 PM  Result Value Ref Range Status   Specimen Description BLOOD LEFT ANTECUBITAL  Final   Special Requests  BOTTLES DRAWN AEROBIC AND ANAEROBIC 5CC EA  Final   Culture   Final    NO GROWTH 5 DAYS Performed at [facility]    Report Status 10/19/2014 FINAL  Final  Culture, blood (routine x 2)     Status: None   Collection Time: 10/12/14 10:16 PM  Result Value Ref Range Status   Specimen Description BLOOD LEFT WRIST  Final   Special Requests BOTTLES DRAWN AEROBIC ONLY 3CC  Final   Culture   Final    NO GROWTH 5 DAYS Note: Culture results may be compromised due to an inadequate volume of blood received in culture bottles. Performed at [facility]    Report Status 10/19/2014 FINAL  Final  MRSA PCR Screening     Status: None   Collection Time: 10/12/14 11:40 PM  Result Value Ref Range Status   MRSA by PCR NEGATIVE NEGATIVE Final    Comment:        The GeneXpert MRSA Assay (FDA approved for NASAL specimens only), is one component of a comprehensive MRSA colonization surveillance program. It is not intended to diagnose MRSA infection nor to guide or monitor treatment for MRSA infections.   Urine culture     Status: None   Collection Time: 10/13/14  4:12 AM  Result Value Ref Range Status   Specimen Description  URINE, RANDOM  Final   Special Requests NONE  Final   Colony Count   Final    >=100,000 COLONIES/ML Performed at [facility]    Culture   Final    Multiple bacterial morphotypes present, none predominant. Suggest appropriate recollection if clinically indicated. Performed at [facility]    Report Status 10/14/2014 FINAL  Final  Body fluid culture     Status: None   Collection Time: 10/13/14  1:18 PM  Result Value Ref Range Status   Specimen Description FLUID RIGHT PLEURAL  Final   Special Requests NONE  Final   Gram Stain   Final    RARE WBC PRESENT, PREDOMINANTLY MONONUCLEAR NO ORGANISMS SEEN Performed at [facility]    Culture   Final    NO GROWTH 3 DAYS Performed at [facility]    Report Status 10/17/2014 FINAL  Final  MRSA PCR Screening     Status: None   Collection Time: 10/18/14  7:45 AM  Result Value Ref Range Status   MRSA by PCR NEGATIVE NEGATIVE Final    Comment:        The GeneXpert MRSA Assay (FDA approved for NASAL specimens only), is one component of a comprehensive MRSA colonization surveillance program. It is not intended to diagnose MRSA infection nor to guide or monitor treatment for MRSA infections.    Imaging: No results found.     Medications:    Infusions: . sodium chloride Stopped (10/22/14 1215)    Scheduled Medications: . sodium chloride   Intravenous Once  . antiseptic oral rinse  7 mL Mouth Rinse BID  . calcitRIOL  0.25 mcg Oral Daily  . calcium carbonate  6 tablet Oral QHS  . darbepoetin (ARANESP) injection - DIALYSIS  200 mcg Intravenous Q Wed-HD  . feeding supplement (NEPRO CARB STEADY)  237 mL Oral TID WC  . FLUoxetine  10 mg Oral Daily  . hydroxychloroquine  400 mg Oral Daily  . multivitamin  1 tablet Oral QHS  . predniSONE  5 mg Oral Q breakfast  . ramelteon  8 mg Oral QHS  . vitamin  B-12  250 mcg Oral QPM    PRN Medications: sodium chloride, sodium chloride, camphor-menthol,  feeding supplement (NEPRO CARB STEADY), heparin, lidocaine (PF), lidocaine-prilocaine, loperamide, oxyCODONE, pentafluoroprop-tetrafluoroeth   Assessment/ Plan:    Principal Problem:   End stage renal disease Active Problems:   Alcohol abuse   FSGS (focal segmental glomerulosclerosis)   Anemia of chronic disease   Lupus (systemic lupus erythematosus)   Elevated troponin   Electrolyte abnormality   Metabolic acidosis   Coagulopathy  #Volume overload secondary to ESRD Trying to resolve placement issue and looking into other long-term access possibilities. -Continue inpatient dialysis MWF. -Continue to look for options for placement and long term access. -Patient will need to complete the hepatitis B series. -Oxygen for comfort. -Renal diet.  #Alcoholic liver disease and severe malnutrition -Continue to work with PT. -Nutritional supplements as below. -Continue feeding supplement per nutrition recommendations. -Continue thiamine, folate, B-12.  #SLE -Continue prednisone 5 mg daily. -Continue hydroxychloroquine 400 mg daily. -Patient will need an eye exam in the next year. -Continue oxycodone 5 mg every 4 hours when necessary.  #Itching/dry skin -Continue Sarna lotion.  #Electrolyte abnormalities -Continue to monitor.  #CKD related metabolic bone disease -Continue home calcium carbonate 1200 mg 3 times a day. -Continue home calcitriol 0.25 g daily.  #Anemia of chronic disease -Continue Aranesp.  #Prolonged QTc -Avoid QT prolonging medications.  #Hypertension -Will not need home amlodipine and metoprolol at discharge.  #Depression -Continue home fluoxetine 10 mg daily.  #Insomnia -Continue ramelteon 8 mg daily at bedtime.   DVT PPX - SCD's while in bed  CODE STATUS - Full.  CONSULTS PLACED - Nephrology, vascular surgery.  DISPO - patient will need to be hospitalized indefinitely for dialysis until long-term access can be obtained or she can go to an  LTACH.  The patient does have a current PCP Eulas Post, Brett Canales, MD) and does need an [facility] hospital follow-up appointment after discharge.    Is the [facility] hospital follow-up appointment a one-time only appointment? no.  Does the patient have transportation limitations that hinder transportation to clinic appointments? yes   SERVICE NEEDED AT [place]         Y = Yes, Blank = No PT: SNF  OT: SNF  RN:   Equipment:   Other:      Length of Stay: 13 day(s)   Signed: Charlesetta Shanks, MD  PGY-1, Internal Medicine Resident Pager: [phone] (7AM-5PM) 10/25/2014, 10:52 AM

## 2014-10-25 NOTE — Progress Notes (Signed)
Subjective: Interval History: has no complaint, eating better.  Objective: Vital signs in last 24 hours: Temp:  [97.4 F (36.3 C)-98.2 F (36.8 C)] 98.2 F (36.8 C) (05/22 0458) Pulse Rate:  [87-89] 89 (05/22 0458) Resp:  [17-20] 17 (05/22 0458) BP: (92-119)/(62-86) 92/62 mmHg (05/22 0458) SpO2:  [94 %-100 %] 94 % (05/22 0458) Weight:  [43.1 kg (95 lb 0.3 oz)] 43.1 kg (95 lb 0.3 oz) (05/21 2114) Weight change: 1.1 kg (2 lb 6.8 oz)  Intake/Output from previous day: 05/21 0701 - 05/22 0700 In: 720 [P.O.:720] Out: 0  Intake/Output this shift:    General appearance: cachectic Resp: diminished breath sounds bibasilar Chest wall: RIJ cath Cardio: S1, S2 normal and systolic murmur: holosystolic 2/6, blowing at apex GI: mild distension, pos bs, liver down 5 cm Extremities: extremities normal, atraumatic, no cyanosis or edema  Lab Results:  Recent Labs  10/22/14 0944 10/22/14 1108 10/23/14 0713  WBC 4.4  --  5.9  HGB 7.6* 8.8* 7.6*  HCT 24.3* 26.0* 24.7*  PLT 45*  --  97*   BMET:  Recent Labs  10/22/14 0944 10/22/14 1108 10/23/14 0714  NA 136 138 135  K 3.8 3.6 3.6  CL 105  --  101  CO2 24  --  26  GLUCOSE 73 73 66  BUN <5*  --  5*  CREATININE 1.75*  --  2.41*  CALCIUM 7.9*  --  8.0*   No results for input(s): PTH in the last 72 hours. Iron Studies: No results for input(s): IRON, TIBC, TRANSFERRIN, FERRITIN in the last 72 hours.  Studies/Results: No results found.  I have reviewed the patient's current medications.  Assessment/Plan: 1 ESRD  For HD MWF 2 Access needs perm access 3 Anemia stable 4 HPTH meds  5 Cirrhosis 6 Dispo awaiting LTAC, then access before outpatient HD   LOS: 13 days   [name] [name] 10/25/2014,9:26 AM

## 2014-10-26 ENCOUNTER — Encounter (HOSPITAL_COMMUNITY): Payer: Self-pay | Admitting: Vascular Surgery

## 2014-10-26 DIAGNOSIS — N649 Disorder of breast, unspecified: Secondary | ICD-10-CM

## 2014-10-26 LAB — CBC
HCT: 28 % — ABNORMAL LOW (ref 36.0–46.0)
Hemoglobin: 8.6 g/dL — ABNORMAL LOW (ref 12.0–15.0)
MCH: 29.9 pg (ref 26.0–34.0)
MCHC: 30.7 g/dL (ref 30.0–36.0)
MCV: 97.2 fL (ref 78.0–100.0)
Platelets: 70 10*3/uL — ABNORMAL LOW (ref 150–400)
RBC: 2.88 MIL/uL — ABNORMAL LOW (ref 3.87–5.11)
RDW: 20.3 % — AB (ref 11.5–15.5)
WBC: 7.8 10*3/uL (ref 4.0–10.5)

## 2014-10-26 LAB — RENAL FUNCTION PANEL
ANION GAP: 8 (ref 5–15)
Albumin: 1.5 g/dL — ABNORMAL LOW (ref 3.5–5.0)
BUN: 9 mg/dL (ref 6–20)
CALCIUM: 8.1 mg/dL — AB (ref 8.9–10.3)
CO2: 25 mmol/L (ref 22–32)
CREATININE: 2.84 mg/dL — AB (ref 0.44–1.00)
Chloride: 100 mmol/L — ABNORMAL LOW (ref 101–111)
GFR calc Af Amer: 20 mL/min — ABNORMAL LOW (ref >60)
GFR calc non Af Amer: 17 mL/min — ABNORMAL LOW (ref >60)
Glucose, Bld: 151 mg/dL — ABNORMAL HIGH (ref 65–99)
Phosphorus: 2.6 mg/dL (ref 2.5–4.6)
Potassium: 3.8 mmol/L (ref 3.5–5.1)
Sodium: 133 mmol/L — ABNORMAL LOW (ref 135–145)

## 2014-10-26 NOTE — Care Management Note (Signed)
Case Management Note  Patient Details  Name: [name] [name] MRN: [id number] Date of Birth: 1956-04-16  Subjective/Objective:                 CM following for progression and d/c planning.   Action/Plan: Continue to follow this pt for d/c needs, unable to place HD access due to malnutrition and other issues, plan now to attempt d/c to [facility] for care and HD or possibly holding HD for a a short period of time to allow pt to d/c to SNF to increase nutritional status.   Expected Discharge Date:                  Expected Discharge Plan:  Skilled Nursing Facility  In-House Referral:  Clinical Social Work  Discharge planning Services  CM Consult  Post Acute Care Choice:    Choice offered to:     DME Arranged:    DME Agency:     HH Arranged:    [place] Agency:     Status of Service:  In process, will continue to follow  Medicare Important Message Given:  Yes Date Medicare IM Given:  10/15/14 Medicare IM give by:  debbie dowell rn,bsn Date Additional Medicare IM Given:  10/21/14 Additional Medicare Important Message give by:  Jasmine Pang RN MPH, case management [phone] Additional IM given 10/26/2014 by Jasmine Pang RN MPH, case manager, [phone] If discussed at [place] of Stay Meetings, dates discussed:    Additional Comments:  Adron Bene, RN 10/26/2014, 4:05 PM

## 2014-10-26 NOTE — Progress Notes (Signed)
Chaplain Note:   Chaplains [place] and Fritz Pickerel visited with Ms. Lookabaugh.   Ms. Kipnis noted that her care team is "trying to find somewhere for her to do dialysis," which is unsettling for her. She expressed that she has children and grandchildren but they haven't been to visit and she has mixed feeloings concerning that.   She enjoys watching lifetime movies.   She expressed that she wouldn't mind a follow-up visits.   Delford Field, Chaplain 10/26/2014

## 2014-10-26 NOTE — Progress Notes (Signed)
[name], female   DOB: 1956/03/14, 59 y.o.   MRN: [id number] Medicine attending: I examined this patient this morning together with resident physician Dr. Karle Starch Moding and I concur with his evaluation and management plan. No acute issues identified. Due to stabilization of renal function, nephrology recommending a trial off dialysis to see if she can maintain adequate function on her own. This will solve a lot of short-term issues.

## 2014-10-26 NOTE — Progress Notes (Signed)
[name], female   DOB: Oct 10, 1955, 59 y.o.   MRN: [id number] I was asked to see the patient for reassessment for permanent access placement. Currently has tunneled catheter right internal jugular vein which is working without difficulty. I reviewed her duplex study which shows very small surface veins bilaterally inadequate for fistula attempt.  Past Medical History  Diagnosis Date  . Anemia, B12 deficiency   . History of acute pancreatitis   . Right knee pain     No recent imaging on chart  . 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 Dr. Mancel Bale, [facility]  . Hypertriglyceridemia   . GERD (gastroesophageal reflux disease)   . Subdural hematoma 02/2008    Likely 2/2 trauma from seizure from EtOH withdrawal, chronic in nature, sees Dr. Jerene Bears. Most recent CT head 10/2009 showing stable but persistent hematoma without mass effect.  . History of seizure disorder     Likely 2/2 alcohol abuse  . Hypocalcemia   . Hypomagnesemia   . Failure to thrive in childhood     Unclear etiology  . HTN (hypertension)   . Thrombocytopenia   . Hepatomegaly     On exam  . Joint pain   . Alcohol abuse   . Vitamin D deficiency   . Pancreatitis   . Insomnia   . Hyperlipidemia   . Pernicious anemia   . Macrocytic anemia   . 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 Depression   . Fx humeral neck 04/17/2011    Transverse fracture- minimally displaced- managed as outpatient   . ABNORMAL PAP SMEAR, LGSIL 07/23/2008    Annotation: HPV positive CIN I Dr. Mancel Bale, [facility] Qualifier: Diagnosis of  By: Oretha Ellis    . Pneumonia 05/20/2012  . Arthritis     "shoulders" (08/15/2013)  . 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  Marland Kitchen Chronic diastolic CHF (congestive heart failure)   . Hypomagnesemia   . Seizures     "don't know when/why I had them; daughter was always  there w/me"  . On home oxygen therapy     "3L; mostly at night" (06/19/2014)  . Shortness of breath dyspnea     History  Substance Use Topics  . Smoking status: Former Smoker -- 0.50 packs/day for 40 years    Types: Cigarettes    Quit date: 09/20/2010  . Smokeless tobacco: Never Used  . Alcohol Use: Yes     Comment: 06/19/2014 "graduated from [employer] (for alcohol abuse) in October 2015"     Family History  Problem Relation Age of Onset  . Cancer Mother     Died from stomach cancer and "flesh eating rash  . Heart failure Father     Died in 20s from an MI  . Alcohol abuse Sister     Twin sister drinks a lot, as did both her parents and brothers  . Stroke Brother     Has 7 brothers, 1 with CVA  . Lupus Mother     Allergies  Allergen Reactions  . Amitriptyline Hcl Swelling    In the face.  . Doxycycline Hyclate Itching    Feels like something crawling under her skin     Current facility-administered medications:  .  0.9 %  sodium chloride infusion, , Intravenous, Continuous, Fredia Sorrow, MD, Stopped at  10/22/14 1215 .  0.9 %  sodium chloride infusion, 100 mL, Intravenous, PRN, Corliss Parish, MD .  0.9 %  sodium chloride infusion, 100 mL, Intravenous, PRN, Corliss Parish, MD .  0.9 %  sodium chloride infusion, , Intravenous, Once, Elam Dutch, MD .  antiseptic oral rinse (CPC / CETYLPYRIDINIUM CHLORIDE 0.05%) solution 7 mL, 7 mL, Mouth Rinse, BID, Bartholomew Crews, MD, 7 mL at 10/26/14 1422 .  calcitRIOL (ROCALTROL) capsule 0.25 mcg, 0.25 mcg, Oral, Daily, Jones Bales, MD, 0.25 mcg at 10/26/14 1421 .  calcium carbonate (TUMS - dosed in mg elemental calcium) chewable tablet 1,200 mg of elemental calcium, 6 tablet, Oral, QHS, Patrecia Pour, MD, 1,200 mg of elemental calcium at 10/25/14 2154 .  camphor-menthol (SARNA) lotion, , Topical, PRN, Langley Gauss Moding, MD .  Darbepoetin Alfa (ARANESP) injection 200 mcg, 200 mcg, Intravenous, Q  Wed-HD, Mauricia Area, MD, 200 mcg at 10/21/14 1831 .  feeding supplement (NEPRO CARB STEADY) liquid 237 mL, 237 mL, Oral, PRN, Corliss Parish, MD .  feeding supplement (NEPRO CARB STEADY) liquid 237 mL, 237 mL, Oral, TID WC, Elam Dutch, MD, 237 mL at 10/26/14 1422 .  FLUoxetine (PROZAC) capsule 10 mg, 10 mg, Oral, Daily, Jones Bales, MD, 10 mg at 10/26/14 1422 .  heparin injection 1,000 Units, 1,000 Units, Dialysis, PRN, Corliss Parish, MD .  hydroxychloroquine (PLAQUENIL) tablet 400 mg, 400 mg, Oral, Daily, Langley Gauss Moding, MD, 400 mg at 10/26/14 1422 .  lidocaine (PF) (XYLOCAINE) 1 % injection 5 mL, 5 mL, Intradermal, PRN, Corliss Parish, MD .  lidocaine-prilocaine (EMLA) cream 1 application, 1 application, Topical, PRN, Corliss Parish, MD .  loperamide (IMODIUM) capsule 2 mg, 2 mg, Oral, PRN, Jones Bales, MD .  multivitamin (RENA-VIT) tablet 1 tablet, 1 tablet, Oral, QHS, Mauricia Area, MD, 1 tablet at 10/25/14 2155 .  oxyCODONE (Oxy IR/ROXICODONE) immediate release tablet 5 mg, 5 mg, Oral, Q4H PRN, Luan Moore, MD, 5 mg at 10/24/14 2200 .  pentafluoroprop-tetrafluoroeth (GEBAUERS) aerosol 1 application, 1 application, Topical, PRN, Corliss Parish, MD .  predniSONE (DELTASONE) tablet 5 mg, 5 mg, Oral, Q breakfast, Langley Gauss Moding, MD, 5 mg at 10/26/14 0756 .  ramelteon (ROZEREM) tablet 8 mg, 8 mg, Oral, QHS, Luan Moore, MD, 8 mg at 10/25/14 2155 .  vitamin B-12 (CYANOCOBALAMIN) tablet 250 mcg, 250 mcg, Oral, QPM, Jones Bales, MD, 250 mcg at 10/25/14 1759  Filed Vitals:   10/25/14 1545 10/25/14 2225 10/26/14 0515 10/26/14 0948  BP: 127/90 125/90 124/89 123/85  Pulse: 91 89 88 89  Temp: 97.8 F (36.6 C) 97.4 F (36.3 C) 97.5 F (36.4 C) 97.9 F (36.6 C)  TempSrc: Oral Oral Oral Oral  Resp: 16 18 18 16   Height:      Weight:  95 lb 3.8 oz (43.2 kg)    SpO2: 100% 98% 100% 100%    Body mass index is 18 kg/(m^2).   On physical  exam she is alert and oriented. She has palpable but diminished radial pulses bilaterally. She does have 1-2+ brachial pulses bilaterally. Surface veins are quite small. She is a very small with malnutrition and quite thin.  Impression and plan: I agree with Dr. Bridgett Larsson. A very high risk for placement of prosthetic graft this time. Concerned also about her durability of such access. With her very small size and probable small axillary vein, would suspect a very high risk for failure of upper arm AV graft. I do  feel that this would be her only viable option for access. Quite concerned about healing and also infection with prosthetic graft placement. She will follow-up with Dr. Bridgett Larsson in several months to discuss permanent access

## 2014-10-26 NOTE — Progress Notes (Signed)
[facility] ROUNDING NOTE   Subjective:   Interval History: no complaints  Appears very weak and debilitated   Objective:  Vital signs in last 24 hours:  Temp:  [97.4 F (36.3 C)-97.9 F (36.6 C)] 97.9 F (36.6 C) (05/23 0948) Pulse Rate:  [88-91] 89 (05/23 0948) Resp:  [16-18] 16 (05/23 0948) BP: (123-127)/(85-90) 123/85 mmHg (05/23 0948) SpO2:  [98 %-100 %] 100 % (05/23 0948) Weight:  [43.2 kg (95 lb 3.8 oz)] 43.2 kg (95 lb 3.8 oz) (05/22 2225)  Weight change: 0.1 kg (3.5 oz) Filed Weights   10/23/14 2046 10/24/14 2114 10/25/14 2225  Weight: 42.2 kg (93 lb 0.6 oz) 43.1 kg (95 lb 0.3 oz) 43.2 kg (95 lb 3.8 oz)    Intake/Output: I/O last 3 completed shifts: In: 340 [P.O.:340] Out: 0    Intake/Output this shift:  Total I/O In: 240 [P.O.:240] Out: -   Cachectic appearance CVS- RRR RS- CTA ABD- BS present soft non-distended EXT- no edema   Basic Metabolic Panel:  Recent Labs Lab 10/20/14 0353 10/21/14 1600 10/22/14 0944 10/22/14 1108 10/23/14 0714  NA 135 134* 136 138 135  K 4.1 4.3 3.8 3.6 3.6  CL 103 101 105  --  101  CO2 25 28 24   --  26  GLUCOSE 92 120* 73 73 66  BUN <5* 6 <5*  --  5*  CREATININE 1.43* 2.54* 1.75*  --  2.41*  CALCIUM 7.6* 7.6* 7.9*  --  8.0*  MG 2.0  --  1.7  --   --   PHOS 2.6 2.8 2.1*  --  3.1    Liver Function Tests:  Recent Labs Lab 10/19/14 1558 10/20/14 0353 10/21/14 1600 10/22/14 0944 10/23/14 0714  AST 56*  --   --   --   --   ALT 21  --   --   --   --   ALKPHOS 311*  --   --   --   --   BILITOT 0.8  --   --   --   --   PROT 8.1  --   --   --   --   ALBUMIN 1.3* 1.2* 1.2* 1.6* 1.6*   No results for input(s): LIPASE, AMYLASE in the last 168 hours. No results for input(s): AMMONIA in the last 168 hours.  CBC:  Recent Labs Lab 10/20/14 0353 10/21/14 1600 10/22/14 0944 10/22/14 1108 10/23/14 0713  WBC 5.9 4.8 4.4  --  5.9  HGB 9.4* 12.6 7.6* 8.8* 7.6*  HCT 29.6* 40.3 24.3* 26.0* 24.7*   MCV 93.4 94.4 94.2  --  96.1  PLT 46* 36* 45*  --  97*    Cardiac Enzymes: No results for input(s): CKTOTAL, CKMB, CKMBINDEX, TROPONINI in the last 168 hours.  BNP: Invalid input(s): POCBNP  CBG: No results for input(s): GLUCAP in the last 168 hours.  Microbiology: Results for orders placed or performed during the hospital encounter of 10/12/14  Culture, blood (routine x 2)     Status: None   Collection Time: 10/12/14 10:04 PM  Result Value Ref Range Status   Specimen Description BLOOD LEFT ANTECUBITAL  Final   Special Requests BOTTLES DRAWN AEROBIC AND ANAEROBIC 5CC EA  Final   Culture   Final    NO GROWTH 5 DAYS Performed at [facility]    Report Status 10/19/2014 FINAL  Final  Culture, blood (routine x 2)     Status: None   Collection Time:  10/12/14 10:16 PM  Result Value Ref Range Status   Specimen Description BLOOD LEFT WRIST  Final   Special Requests BOTTLES DRAWN AEROBIC ONLY 3CC  Final   Culture   Final    NO GROWTH 5 DAYS Note: Culture results may be compromised due to an inadequate volume of blood received in culture bottles. Performed at [facility]    Report Status 10/19/2014 FINAL  Final  MRSA PCR Screening     Status: None   Collection Time: 10/12/14 11:40 PM  Result Value Ref Range Status   MRSA by PCR NEGATIVE NEGATIVE Final    Comment:        The GeneXpert MRSA Assay (FDA approved for NASAL specimens only), is one component of a comprehensive MRSA colonization surveillance program. It is not intended to diagnose MRSA infection nor to guide or monitor treatment for MRSA infections.   Urine culture     Status: None   Collection Time: 10/13/14  4:12 AM  Result Value Ref Range Status   Specimen Description URINE, RANDOM  Final   Special Requests NONE  Final   Colony Count   Final    >=100,000 COLONIES/ML Performed at [facility]    Culture   Final    Multiple bacterial morphotypes present, none predominant. Suggest  appropriate recollection if clinically indicated. Performed at [facility]    Report Status 10/14/2014 FINAL  Final  Body fluid culture     Status: None   Collection Time: 10/13/14  1:18 PM  Result Value Ref Range Status   Specimen Description FLUID RIGHT PLEURAL  Final   Special Requests NONE  Final   Gram Stain   Final    RARE WBC PRESENT, PREDOMINANTLY MONONUCLEAR NO ORGANISMS SEEN Performed at [facility]    Culture   Final    NO GROWTH 3 DAYS Performed at [facility]    Report Status 10/17/2014 FINAL  Final  MRSA PCR Screening     Status: None   Collection Time: 10/18/14  7:45 AM  Result Value Ref Range Status   MRSA by PCR NEGATIVE NEGATIVE Final    Comment:        The GeneXpert MRSA Assay (FDA approved for NASAL specimens only), is one component of a comprehensive MRSA colonization surveillance program. It is not intended to diagnose MRSA infection nor to guide or monitor treatment for MRSA infections.     Coagulation Studies: No results for input(s): LABPROT, INR in the last 72 hours.  Urinalysis: No results for input(s): COLORURINE, LABSPEC, PHURINE, GLUCOSEU, HGBUR, BILIRUBINUR, KETONESUR, PROTEINUR, UROBILINOGEN, NITRITE, LEUKOCYTESUR in the last 72 hours.  Invalid input(s): APPERANCEUR    Imaging: No results found.   Medications:   . sodium chloride Stopped (10/22/14 1215)   . sodium chloride   Intravenous Once  . antiseptic oral rinse  7 mL Mouth Rinse BID  . calcitRIOL  0.25 mcg Oral Daily  . calcium carbonate  6 tablet Oral QHS  . darbepoetin (ARANESP) injection - DIALYSIS  200 mcg Intravenous Q Wed-HD  . feeding supplement (NEPRO CARB STEADY)  237 mL Oral TID WC  . FLUoxetine  10 mg Oral Daily  . hydroxychloroquine  400 mg Oral Daily  . multivitamin  1 tablet Oral QHS  . predniSONE  5 mg Oral Q breakfast  . ramelteon  8 mg Oral QHS  . vitamin B-12  250 mcg Oral QPM   sodium chloride, sodium chloride,  camphor-menthol, feeding supplement (  NEPRO CARB STEADY), heparin, lidocaine (PF), lidocaine-prilocaine, loperamide, oxyCODONE, pentafluoroprop-tetrafluoroeth  Assessment/ Plan:   Acute  Renal failure  Has improved with dialysis on admission it appears that she had oliguria, acidosis and hyperkalemia. Renal biopsy showed scar tissue   Baseline creatinine 1.5  Does need permanent access  Will hold off on dialysis and see if she has a recovery   Disposition is proving difficult with a number of social barriers     LOS: [address] [place] W @TODAY @10 :15 AM

## 2014-10-26 NOTE — Progress Notes (Signed)
Occupational Therapy Treatment Patient Details Name: [name] [name] MRN: [id number] DOB: 09/20/1955 Today's Date: 10/26/2014    History of present illness Pt admitted with dyspnea and inability to take medications due to vomiting.  Pt also reports diarrhea. Now s/p thoracentesis and placement of central venous HD catheter. Pt discharged last week with HCAP. PMH: CKD, CHF, Lupus, pericardial window, ETOH window, HTN, depression.   OT comments  Pt making progress with functional goal and should continue with acute OT services to increase level of function an safety  Follow Up Recommendations  SNF    Equipment Recommendations  Other (comment) (TBD at next venue of care)    Recommendations for Other Services      Precautions / Restrictions Precautions Precautions: Fall Restrictions Weight Bearing Restrictions: No       Mobility Bed Mobility Overal bed mobility: Modified Independent       Supine to sit: Modified independent (Device/Increase time) Sit to supine: Modified independent (Device/Increase time)   General bed mobility comments: Used bed rails; HOB elevated  Transfers Overall transfer level: Needs assistance   Transfers: Sit to/from Stand Sit to Stand: Supervision         General transfer comment: verbal cues for correct hand placement    Balance     Sitting balance-Leahy Scale: Good       Standing balance-Leahy Scale: Fair                     ADL       Grooming: Wash/dry hands;Standing;Wash/dry face;Supervision/safety;Set up   Upper Body Bathing: Sitting;Set up;Supervision/ safety       Upper Body Dressing : Sitting;Supervision/safety;Set up       Toilet Transfer: Min guard;Ambulation;BSC;Supervision/safety   Toileting- Water quality scientist and Hygiene: Min guard;Sit to/from stand                Vision  no change from baseline                              Cognition   Behavior During Therapy: [facility] for  tasks assessed/performed Overall Cognitive Status: Within Functional Limits for tasks assessed                       Extremity/Trunk Assessment   generalized weakness                        General Comments  p tpleasant and cooperative    Pertinent Vitals/ Pain       Pain Assessment: No/denies pain  Home Living  with family                                        Prior Functioning/Environment  Mod I           Frequency Min 2X/week     Progress Toward Goals  OT Goals(current goals can now be found in the care plan section)  Progress towards OT goals: Progressing toward goals  Acute Rehab OT Goals Patient Stated Goal: get stronger before going home  Plan Discharge plan remains appropriate                     End of Session Equipment Utilized During Treatment: Rolling walker;Oxygen   Activity Tolerance Patient tolerated treatment well  Patient Left in bed;with call bell/phone within reach;with bed alarm set             Time: 1044-1101 OT Time Calculation (min): 17 min  Charges: OT Evaluation $Initial OT Evaluation Tier I: 1 Procedure OT Treatments $Self Care/Home Management : 8-22 mins  Britt Bottom 10/26/2014, 12:13 PM

## 2014-10-26 NOTE — Clinical Social Work Note (Signed)
Clinical Social Worker continuing to follow patient for support and discharge planning needs.  CSW spoke with MD regarding patient need for placement and the inability for patient to receive access for outpatient dialysis.  CSW provided information to [place] supervisor to submit for the possibility of the [facility] with access to [facility].  Clinical information sent to facility - await response.  Per most recent MD documentation, nephrology recommending a trial off of dialysis and will observe her ability to maintain on her own.  CSW to continue to pursue placement with possible dialysis needs and update medical team once decision has been made.  CSW remains available for support and to facilitate patient discharge needs once medically stable.  Barbette Or, [place]

## 2014-10-26 NOTE — Progress Notes (Signed)
10/26/2014 3:09 PM Hemodialysis Outpatient Note; this patient was denied access to [facility] [facility] and the [facility] due to the medical directors' determination that the patient is unstable for outpatient dialysis at this time. Per Dr. Justin Mend "we are holding on her treatment today and holding on outpatient scheduling until further notice". Thank you. Gordy Savers

## 2014-10-26 NOTE — Progress Notes (Signed)
Physical Therapy Treatment Patient Details Name: [name] [name] MRN: [id number] DOB: [birth year] Today's Date: 10/26/2014    History of Present Illness Pt admitted with dyspnea and inability to take medications due to vomiting.  Pt also reports diarrhea. Now s/p thoracentesis and placement of central venous HD catheter. Pt discharged last week with HCAP. PMH: CKD, CHF, Lupus, pericardial window, ETOH window, HTN, depression.    PT Comments    Good participation today, despite reported fatigue before we began; Noted difficulty with rise from low commode, indicative of continued weakness; Will continue to work on strengthening and activity tolerance to maximize independence and safety with mobility; will benefit from psot-acute rehab  Follow Up Recommendations  SNF;Supervision/Assistance - 24 hour     Equipment Recommendations  None recommended by PT    Recommendations for Other Services       Precautions / Restrictions Precautions Precautions: Fall Restrictions Weight Bearing Restrictions: No    Mobility  Bed Mobility Overal bed mobility: Modified Independent       Supine to sit: Modified independent (Device/Increase time) Sit to supine: Modified independent (Device/Increase time)   General bed mobility comments: Used bed rails; HOB elevated  Transfers Overall transfer level: Needs assistance Equipment used: Rolling walker (2 wheeled) Transfers: Sit to/from Stand Sit to Stand: Mod assist         General transfer comment: Mod assist to power up from low commode  Ambulation/Gait Ambulation/Gait assistance: Min guard Ambulation Distance (Feet): 120 Feet Assistive device: Rolling walker (2 wheeled) Gait Pattern/deviations: Step-through pattern     General Gait Details: No gross loss of balance with bil UE support.  Pt still weak and cannot withstand challenges to balance.  Used cane PTA therefore is not at baseline.  Pt reprots her legs got tired wth  walking   Stairs            Wheelchair Mobility    Modified Rankin (Stroke Patients Only)       Balance     Sitting balance-Leahy Scale: Good       Standing balance-Leahy Scale: Fair                      Cognition Arousal/Alertness: Awake/alert Behavior During Therapy: WFL for tasks assessed/performed Overall Cognitive Status: Within Functional Limits for tasks assessed                      Exercises      General Comments        Pertinent Vitals/Pain Pain Assessment: No/denies pain    Home Living                      Prior Function            PT Goals (current goals can now be found in the care plan section) Acute Rehab PT Goals Patient Stated Goal: get stronger before going home PT Goal Formulation: With patient Time For Goal Achievement: 10/28/14 Potential to Achieve Goals: Good Progress towards PT goals: Progressing toward goals    Frequency  Min 3X/week    PT Plan Current plan remains appropriate    Co-evaluation             End of Session Equipment Utilized During Treatment: Gait belt;Oxygen Activity Tolerance: Patient tolerated treatment well;Patient limited by fatigue Patient left: in chair;with call bell/phone within reach;with family/visitor present     Time: 1601-0932 PT Time Calculation (min) (ACUTE ONLY): 20 min  Charges:  $Gait Training: 8-22 mins                    G Codes:      Quin Hoop 10/26/2014, 3:22 PM  Roney Marion, [place] Pager [phone] Office [phone]

## 2014-10-26 NOTE — Progress Notes (Signed)
Subjective:    The patient continues to do well. Asked the patient about the breast issue raised in previous nursing note.  She reports that her right breast is larger than her left for the past several weeks. This proceeded insertion of her central line or a tunneled dialysis catheter. She denies any pain, but she reports that the breast feels heavier.  Interval Events: -Afebrile, vital signs stable.   Objective:    Vital Signs:   Temp:  [97.4 F (36.3 C)-97.8 F (36.6 C)] 97.5 F (36.4 C) (05/23 0515) Pulse Rate:  [88-91] 88 (05/23 0515) Resp:  [16-18] 18 (05/23 0515) BP: (124-127)/(89-90) 124/89 mmHg (05/23 0515) SpO2:  [98 %-100 %] 100 % (05/23 0515) Weight:  [95 lb 3.8 oz (43.2 kg)] 95 lb 3.8 oz (43.2 kg) (05/22 2225) Last BM Date: 10/24/14  24-hour weight change: Weight change: 3.5 oz (0.1 kg)  Intake/Output:   Intake/Output Summary (Last 24 hours) at 10/26/14 0806 Last data filed at 10/26/14 0600  Gross per 24 hour  Intake    100 ml  Output      0 ml  Net    100 ml      Physical Exam: General: Thin, cachectic appearing, alert, in no acute distress.  Lungs:  Clear to auscultation bilaterally, no wheezing, rales or rhonchi.   Heart: Regular rate and rhythm, S1 and S2 normal without gallop, murmur, or rubs.  Abdomen:  BS normoactive, nontender, nondistended.  Extremities: No pretibial edema, no tenderness to palpation.  Breast (chaparoned exam): Right breast with increased edema, no palpable masses or lesions.  Labs:  Basic Metabolic Panel:  Recent Labs Lab 10/20/14 0353 10/21/14 1600 10/22/14 0944 10/22/14 1108 10/23/14 0714  NA 135 134* 136 138 135  K 4.1 4.3 3.8 3.6 3.6  CL 103 101 105  --  101  CO2 25 28 24   --  26  GLUCOSE 92 120* 73 73 66  BUN <5* 6 <5*  --  5*  CREATININE 1.43* 2.54* 1.75*  --  2.41*  CALCIUM 7.6* 7.6* 7.9*  --  8.0*  MG 2.0  --  1.7  --   --   PHOS 2.6 2.8 2.1*  --  3.1    Liver Function Tests:  Recent Labs Lab  10/19/14 1558 10/20/14 0353 10/21/14 1600 10/22/14 0944 10/23/14 0714  AST 56*  --   --   --   --   ALT 21  --   --   --   --   ALKPHOS 311*  --   --   --   --   BILITOT 0.8  --   --   --   --   PROT 8.1  --   --   --   --   ALBUMIN 1.3* 1.2* 1.2* 1.6* 1.6*   CBC:  Recent Labs Lab 10/20/14 0353 10/21/14 1600 10/22/14 0944 10/22/14 1108 10/23/14 0713  WBC 5.9 4.8 4.4  --  5.9  HGB 9.4* 12.6 7.6* 8.8* 7.6*  HCT 29.6* 40.3 24.3* 26.0* 24.7*  MCV 93.4 94.4 94.2  --  96.1  PLT 46* 36* 45*  --  97*    Iron/TIBC/Ferritin/ %Sat    Component Value Date/Time   IRON 120 10/20/2014 0353   TIBC 143* 10/20/2014 0353   FERRITIN 1356* 10/20/2014 0353   IRONPCTSAT 84* 10/20/2014 0353   IRONPCTSAT NOT CALC 06/18/2014 1620   Microbiology: Results for orders placed or performed during the hospital encounter of 10/12/14  Culture, blood (routine x 2)     Status: None   Collection Time: 10/12/14 10:04 PM  Result Value Ref Range Status   Specimen Description BLOOD LEFT ANTECUBITAL  Final   Special Requests BOTTLES DRAWN AEROBIC AND ANAEROBIC 5CC EA  Final   Culture   Final    NO GROWTH 5 DAYS Performed at [facility]    Report Status 10/19/2014 FINAL  Final  Culture, blood (routine x 2)     Status: None   Collection Time: 10/12/14 10:16 PM  Result Value Ref Range Status   Specimen Description BLOOD LEFT WRIST  Final   Special Requests BOTTLES DRAWN AEROBIC ONLY 3CC  Final   Culture   Final    NO GROWTH 5 DAYS Note: Culture results may be compromised due to an inadequate volume of blood received in culture bottles. Performed at [facility]    Report Status 10/19/2014 FINAL  Final  MRSA PCR Screening     Status: None   Collection Time: 10/12/14 11:40 PM  Result Value Ref Range Status   MRSA by PCR NEGATIVE NEGATIVE Final    Comment:        The GeneXpert MRSA Assay (FDA approved for NASAL specimens only), is one component of a comprehensive MRSA  colonization surveillance program. It is not intended to diagnose MRSA infection nor to guide or monitor treatment for MRSA infections.   Urine culture     Status: None   Collection Time: 10/13/14  4:12 AM  Result Value Ref Range Status   Specimen Description URINE, RANDOM  Final   Special Requests NONE  Final   Colony Count   Final    >=100,000 COLONIES/ML Performed at [facility]    Culture   Final    Multiple bacterial morphotypes present, none predominant. Suggest appropriate recollection if clinically indicated. Performed at [facility]    Report Status 10/14/2014 FINAL  Final  Body fluid culture     Status: None   Collection Time: 10/13/14  1:18 PM  Result Value Ref Range Status   Specimen Description FLUID RIGHT PLEURAL  Final   Special Requests NONE  Final   Gram Stain   Final    RARE WBC PRESENT, PREDOMINANTLY MONONUCLEAR NO ORGANISMS SEEN Performed at [facility]    Culture   Final    NO GROWTH 3 DAYS Performed at [facility]    Report Status 10/17/2014 FINAL  Final  MRSA PCR Screening     Status: None   Collection Time: 10/18/14  7:45 AM  Result Value Ref Range Status   MRSA by PCR NEGATIVE NEGATIVE Final    Comment:        The GeneXpert MRSA Assay (FDA approved for NASAL specimens only), is one component of a comprehensive MRSA colonization surveillance program. It is not intended to diagnose MRSA infection nor to guide or monitor treatment for MRSA infections.    Imaging: No results found.     Medications:    Infusions: . sodium chloride Stopped (10/22/14 1215)    Scheduled Medications: . sodium chloride   Intravenous Once  . antiseptic oral rinse  7 mL Mouth Rinse BID  . calcitRIOL  0.25 mcg Oral Daily  . calcium carbonate  6 tablet Oral QHS  . darbepoetin (ARANESP) injection - DIALYSIS  200 mcg Intravenous Q Wed-HD  . feeding supplement (NEPRO CARB STEADY)  237 mL Oral TID WC  . FLUoxetine  10 mg  Oral Daily  . hydroxychloroquine  400 mg Oral Daily  . multivitamin  1 tablet Oral QHS  . predniSONE  5 mg Oral Q breakfast  . ramelteon  8 mg Oral QHS  . vitamin B-12  250 mcg Oral QPM    PRN Medications: sodium chloride, sodium chloride, camphor-menthol, feeding supplement (NEPRO CARB STEADY), heparin, lidocaine (PF), lidocaine-prilocaine, loperamide, oxyCODONE, pentafluoroprop-tetrafluoroeth   Assessment/ Plan:    Principal Problem:   End stage renal disease Active Problems:   Alcohol abuse   FSGS (focal segmental glomerulosclerosis)   Anemia of chronic disease   Lupus (systemic lupus erythematosus)   Elevated troponin   Electrolyte abnormality   Metabolic acidosis   Coagulopathy   ESRD (end stage renal disease) on dialysis  #Volume overload secondary to ESRD Trying to resolve placement issue and looking into other long-term access possibilities. -Continue inpatient dialysis MWF. -Continue to look for options for placement and long term access. -Patient will need to complete the hepatitis B series. -Oxygen for comfort. -Renal diet.  #Alcoholic liver disease and severe malnutrition -Continue to work with PT. -Nutritional supplements as below. -Continue feeding supplement per nutrition recommendations. -Continue thiamine, folate, B-12.  #SLE -Continue prednisone 5 mg daily. -Continue hydroxychloroquine 400 mg daily. -Patient will need an eye exam in the next year. -Continue oxycodone 5 mg every 4 hours when necessary.  #Breast edema The patient's right breast appears to be holding onto fluid. This is not surprising given her poor nutritional status. She had a mammogram with tomosynthesis in January 2016 that was normal. -Encouraged patient that this will likely resolve with time. -No further workup.  #Itching/dry skin -Continue Sarna lotion.  #Electrolyte abnormalities -Continue to monitor.  #CKD related metabolic bone disease -Continue home calcium  carbonate 1200 mg 3 times a day. -Continue home calcitriol 0.25 g daily.  #Anemia of chronic disease -Continue Aranesp.  #Prolonged QTc -Avoid QT prolonging medications.  #Hypertension -Will not need home amlodipine and metoprolol at discharge.  #Depression -Continue home fluoxetine 10 mg daily.  #Insomnia -Continue ramelteon 8 mg daily at bedtime.   DVT PPX - SCD's while in bed  CODE STATUS - Full.  CONSULTS PLACED - Nephrology, vascular surgery.  DISPO - patient will need to be hospitalized indefinitely for dialysis until long-term access can be obtained or she can go to an LTACH.  The patient does have a current PCP Eulas Post, Brett Canales, MD) and does need an [facility] hospital follow-up appointment after discharge.    Is the [facility] hospital follow-up appointment a one-time only appointment? no.  Does the patient have transportation limitations that hinder transportation to clinic appointments? yes   SERVICE NEEDED AT [place]         Y = Yes, Blank = No PT: SNF  OT: SNF  RN:   Equipment:   Other:      Length of Stay: 14 day(s)   Signed: Charlesetta Shanks, MD  PGY-1, Internal Medicine Resident Pager: [phone] (7AM-5PM) 10/26/2014, 8:06 AM

## 2014-10-27 NOTE — Clinical Social Work Note (Signed)
Clinical Social Worker has faxed pt's clinicals and FL-2 to SNF's in [place] area for review. Patient also has bed offers at [facility] and [facility].   FL-2 on chart for MD signature.  CSW remains available.   Glendon Axe, MSW, LCSWA [phone] 10/27/2014 11:44 AM

## 2014-10-27 NOTE — Progress Notes (Signed)
Subjective:    The patient reports doing well this morning, she is sad due to the conversation she had with nephrology regarding expectations on dialysis. She reports very little urine output off of dialysis, and she says that some days she is not urinating at all.  Interval Events: -Afebrile, vital signs stable. -Started on trial off of dialysis by Dr. Justin Mend. -Seen by Dr. Donnetta Hutching of vascular surgery who agrees that she is not a candidate for graft placement currently. -Urinated 150 mL yesterday.  -Per case management note, Dr. Justin Mend has discussed the possibility of outpatient dialysis at the [facility] dialysis center.  Social worker is currently looking for a SNF in [place].   Objective:    Vital Signs:   Temp:  [97.8 F (36.6 C)-98.4 F (36.9 C)] 98.4 F (36.9 C) (05/24 0952) Pulse Rate:  [88-91] 88 (05/24 0952) Resp:  [16-18] 17 (05/24 0952) BP: (117-126)/(79-88) 119/79 mmHg (05/24 0952) SpO2:  [96 %-100 %] 100 % (05/24 0952) Weight:  [96 lb (43.545 kg)] 96 lb (43.545 kg) (05/23 2121) Last BM Date: 10/26/14  24-hour weight change: Weight change: 12.2 oz (0.345 kg)  Intake/Output:   Intake/Output Summary (Last 24 hours) at 10/27/14 1128 Last data filed at 10/27/14 0952  Gross per 24 hour  Intake    800 ml  Output    151 ml  Net    649 ml      Physical Exam: General: Thin, cachectic appearing, alert, in no acute distress.  Lungs:  Reduced breath sounds at bilateral lung bases, R>L.  Heart: Regular rate and rhythm, S1 and S2 normal without gallop, murmur, or rubs.  Abdomen:  BS normoactive, nontender, nondistended.  Extremities: No pretibial edema, no tenderness to palpation.   Labs:  Basic Metabolic Panel:  Recent Labs Lab 10/21/14 1600 10/22/14 0944 10/22/14 1108 10/23/14 0714 10/26/14 1405  NA 134* 136 138 135 133*  K 4.3 3.8 3.6 3.6 3.8  CL 101 105  --  101 100*  CO2 28 24  --  26 25  GLUCOSE 120* 73 73 66 151*  BUN 6 <5*  --  5* 9  CREATININE 2.54*  1.75*  --  2.41* 2.84*  CALCIUM 7.6* 7.9*  --  8.0* 8.1*  MG  --  1.7  --   --   --   PHOS 2.8 2.1*  --  3.1 2.6    Liver Function Tests:  Recent Labs Lab 10/21/14 1600 10/22/14 0944 10/23/14 0714 10/26/14 1405  ALBUMIN 1.2* 1.6* 1.6* 1.5*   CBC:  Recent Labs Lab 10/21/14 1600 10/22/14 0944 10/22/14 1108 10/23/14 0713 10/26/14 1405  WBC 4.8 4.4  --  5.9 7.8  HGB 12.6 7.6* 8.8* 7.6* 8.6*  HCT 40.3 24.3* 26.0* 24.7* 28.0*  MCV 94.4 94.2  --  96.1 97.2  PLT 36* 45*  --  97* 70*    Iron/TIBC/Ferritin/ %Sat    Component Value Date/Time   IRON 120 10/20/2014 0353   TIBC 143* 10/20/2014 0353   FERRITIN 1356* 10/20/2014 0353   IRONPCTSAT 84* 10/20/2014 0353   IRONPCTSAT NOT CALC 06/18/2014 1620   Microbiology: Results for orders placed or performed during the hospital encounter of 10/12/14  Culture, blood (routine x 2)     Status: None   Collection Time: 10/12/14 10:04 PM  Result Value Ref Range Status   Specimen Description BLOOD LEFT ANTECUBITAL  Final   Special Requests BOTTLES DRAWN AEROBIC AND ANAEROBIC 5CC EA  Final   Culture  Final    NO GROWTH 5 DAYS Performed at [facility]    Report Status 10/19/2014 FINAL  Final  Culture, blood (routine x 2)     Status: None   Collection Time: 10/12/14 10:16 PM  Result Value Ref Range Status   Specimen Description BLOOD LEFT WRIST  Final   Special Requests BOTTLES DRAWN AEROBIC ONLY 3CC  Final   Culture   Final    NO GROWTH 5 DAYS Note: Culture results may be compromised due to an inadequate volume of blood received in culture bottles. Performed at [facility]    Report Status 10/19/2014 FINAL  Final  MRSA PCR Screening     Status: None   Collection Time: 10/12/14 11:40 PM  Result Value Ref Range Status   MRSA by PCR NEGATIVE NEGATIVE Final    Comment:        The GeneXpert MRSA Assay (FDA approved for NASAL specimens only), is one component of a comprehensive MRSA colonization surveillance  program. It is not intended to diagnose MRSA infection nor to guide or monitor treatment for MRSA infections.   Urine culture     Status: None   Collection Time: 10/13/14  4:12 AM  Result Value Ref Range Status   Specimen Description URINE, RANDOM  Final   Special Requests NONE  Final   Colony Count   Final    >=100,000 COLONIES/ML Performed at [facility]    Culture   Final    Multiple bacterial morphotypes present, none predominant. Suggest appropriate recollection if clinically indicated. Performed at [facility]    Report Status 10/14/2014 FINAL  Final  Body fluid culture     Status: None   Collection Time: 10/13/14  1:18 PM  Result Value Ref Range Status   Specimen Description FLUID RIGHT PLEURAL  Final   Special Requests NONE  Final   Gram Stain   Final    RARE WBC PRESENT, PREDOMINANTLY MONONUCLEAR NO ORGANISMS SEEN Performed at [facility]    Culture   Final    NO GROWTH 3 DAYS Performed at [facility]    Report Status 10/17/2014 FINAL  Final  MRSA PCR Screening     Status: None   Collection Time: 10/18/14  7:45 AM  Result Value Ref Range Status   MRSA by PCR NEGATIVE NEGATIVE Final    Comment:        The GeneXpert MRSA Assay (FDA approved for NASAL specimens only), is one component of a comprehensive MRSA colonization surveillance program. It is not intended to diagnose MRSA infection nor to guide or monitor treatment for MRSA infections.    Imaging: No results found.     Medications:    Infusions: . sodium chloride Stopped (10/22/14 1215)    Scheduled Medications: . sodium chloride   Intravenous Once  . antiseptic oral rinse  7 mL Mouth Rinse BID  . calcitRIOL  0.25 mcg Oral Daily  . calcium carbonate  6 tablet Oral QHS  . darbepoetin (ARANESP) injection - DIALYSIS  200 mcg Intravenous Q Wed-HD  . feeding supplement (NEPRO CARB STEADY)  237 mL Oral TID WC  . FLUoxetine  10 mg Oral Daily  .  hydroxychloroquine  400 mg Oral Daily  . multivitamin  1 tablet Oral QHS  . predniSONE  5 mg Oral Q breakfast  . ramelteon  8 mg Oral QHS  . vitamin B-12  250 mcg Oral QPM    PRN Medications: sodium chloride,  sodium chloride, camphor-menthol, feeding supplement (NEPRO CARB STEADY), heparin, lidocaine (PF), lidocaine-prilocaine, loperamide, oxyCODONE, pentafluoroprop-tetrafluoroeth   Assessment/ Plan:    Principal Problem:   End stage renal disease Active Problems:   Alcohol abuse   FSGS (focal segmental glomerulosclerosis)   Anemia of chronic disease   Lupus (systemic lupus erythematosus)   Elevated troponin   Electrolyte abnormality   Metabolic acidosis   Coagulopathy   ESRD (end stage renal disease) on dialysis  #Volume overload secondary to ESRD Not candidate for fistula or graft placement per second opinion from vascular surgery. She may be able to receive outpatient dialysis with her tunnelled dialysis catheter at [facility] center. Currently looking for a SNF in that area. Poor urine output since stopping dialysis. -Resume dialysis when nephrology recommends. [facility] placement per [facility] Dames -CSW searching for SNF in [place] area. -Patient will need to complete the hepatitis B series. -Oxygen for comfort. -Renal diet.  #Alcoholic liver disease and severe malnutrition -Continue to work with PT. -Continue feeding supplement per nutrition recommendations. -Continue thiamine, folate, B-12.  #SLE Holding off on NSAIDs due to possible nephrotoxicity. -Continue prednisone 5 mg daily. -Continue hydroxychloroquine 400 mg daily. -Patient will need an eye exam in the next year. -Continue oxycodone 5 mg every 4 hours when necessary.  #Itching/dry skin -Continue Sarna lotion.  #Electrolyte abnormalities -Continue to monitor.  #CKD related metabolic bone disease -Continue home calcium carbonate 1200 mg 3 times a day. -Continue home calcitriol 0.25 g  daily.  #Anemia of chronic disease -Continue Aranesp.  #Prolonged QTc -Avoid QT prolonging medications.  #Hypertension -Will not need home amlodipine and metoprolol at discharge.  #Depression -Continue home fluoxetine 10 mg daily.  #Insomnia -Continue ramelteon 8 mg daily at bedtime.   DVT PPX - SCD's while in bed  CODE STATUS - Full.  CONSULTS PLACED - Nephrology, vascular surgery.  DISPO - patient will need to be hospitalized indefinitely for dialysis until long-term access can be obtained or she can go to an LTACH.  The patient does have a current PCP Eulas Post, Brett Canales, MD) and does need an [facility] hospital follow-up appointment after discharge.    Is the [facility] hospital follow-up appointment a one-time only appointment? no.  Does the patient have transportation limitations that hinder transportation to clinic appointments? yes   SERVICE NEEDED AT [place]         Y = Yes, Blank = No PT: SNF  OT: SNF  RN:   Equipment:   Other:      Length of Stay: 15 day(s)   Signed: Charlesetta Shanks, MD  PGY-1, Internal Medicine Resident Pager: [phone] (7AM-5PM) 10/27/2014, 11:28 AM

## 2014-10-27 NOTE — Progress Notes (Signed)
Chaplain Rounds: Celebrated with patient/ sister   [name] [name] appeared to be alert and cheerful.  Her sister [name] was with her and they both seem to be enjoying watching TV together and chatting.

## 2014-10-27 NOTE — Progress Notes (Signed)
Physical Therapy Treatment Patient Details Name: [name] [name] MRN: [id number] DOB: 08/16/1955 Today's Date: 10/27/2014    History of Present Illness Pt admitted with dyspnea and inability to take medications due to vomiting.  Pt also reports diarrhea. Now s/p thoracentesis and placement of central venous HD catheter. Pt discharged last week with HCAP. PMH: CKD, CHF, Lupus, pericardial window, ETOH window, HTN, depression.    PT Comments    Making progress with activity tolerance as evidenced by the ability to walk the same distance as yesterday, on 2L O2 as opposed to 3 L yesterday -- will find out this: if pt was not normally on home O2 (perhaps before her recent bout with pna), is it worth considering trying to wean her completely off?  Walk conducted on 2 L and O2 sats were 93-95% post amb.   Follow Up Recommendations  SNF;Supervision/Assistance - 24 hour     Equipment Recommendations  Rolling walker with 5" wheels;3in1 (PT)    Recommendations for Other Services       Precautions / Restrictions Precautions Precautions: Fall Precaution Comments: Fall precautions greatly reduced with use of RW    Mobility  Bed Mobility Overal bed mobility: Modified Independent                Transfers Overall transfer level: Needs assistance Equipment used: Rolling walker (2 wheeled) Transfers: Sit to/from Stand Sit to Stand: Min guard         General transfer comment: from higher bed; reports continued difficulty with standing from lower surfaces  Ambulation/Gait Ambulation/Gait assistance: Min guard (without physcial contact) Ambulation Distance (Feet): 120 Feet Assistive device: Rolling walker (2 wheeled) Gait Pattern/deviations: Step-through pattern;Decreased stride length   Gait velocity interpretation: Below normal speed for age/gender General Gait Details: No gross Loss of balance with bil UE support; will plan to work woth pt with cane next session   Stairs            Wheelchair Mobility    Modified Rankin (Stroke Patients Only)       Balance     Sitting balance-Leahy Scale: Good       Standing balance-Leahy Scale: Fair                      Cognition Arousal/Alertness: Awake/alert Behavior During Therapy: WFL for tasks assessed/performed Overall Cognitive Status: Within Functional Limits for tasks assessed                      Exercises      General Comments        Pertinent Vitals/Pain Pain Assessment: No/denies pain    Home Living                      Prior Function            PT Goals (current goals can now be found in the care plan section) Acute Rehab PT Goals Patient Stated Goal: get stronger before going home PT Goal Formulation: With patient Time For Goal Achievement: 10/28/14 Potential to Achieve Goals: Good Progress towards PT goals: Progressing toward goals    Frequency  Min 3X/week    PT Plan Current plan remains appropriate    Co-evaluation             End of Session Equipment Utilized During Treatment: Oxygen Activity Tolerance: Patient tolerated treatment well Patient left: in bed;with call bell/phone within reach;with family/visitor present  Time: 1848-5927 PT Time Calculation (min) (ACUTE ONLY): 25 min  Charges:  $Gait Training: 23-37 mins                    G Codes:      Quin Hoop 10/27/2014, 2:50 PM  Roney Marion, [place] Pager [phone] Office [phone]

## 2014-10-27 NOTE — Progress Notes (Signed)
[name], female   DOB: Oct 28, 1955, 59 y.o.   MRN: [id number] Medicine attending: I examined this patient this morning together with resident physician Dr. Karle Starch Moding and I concur with his evaluation and management plan. We are holding dialysis for the moment. Urine output only 151 mL over the last 24 hours. I anticipate that she will be dialysis dependent. We now have a second opinion from another vascular surgeon who also feels she is at an acceptable risk for attempt at a AV fistula/vascular graft procedure. Care management assisting with discharge plans to an extended care facility in the [place] area anticipating that she will need ongoing dialysis.

## 2014-10-27 NOTE — Care Management Note (Signed)
Case Management Note  Patient Details  Name: MAIYAH GOYNE MRN: [id number] Date of Birth: 15-Jan-1956  Subjective/Objective:                 CM following for progression and d/c planning.   Action/Plan:  Met with pt 10/27/2014 re discussion with Dr Justin Mend re possibility of d/c to SNF in [place] and HD at the [facility] HD center per Dr Justin Mend. The recalls this conversation with Dr Justin Mend and is in agreement to proceed with placement in the [place] area. CSW notified of plan and Bryson Dames of the HD center will begin process of placing in the HD center in [place]. Have asked CSW to begin search for SNF in [place].   Expected Discharge Date:        10/30/2014          Expected Discharge Plan:  Skilled Nursing Facility  In-House Referral:  Clinical Social Work  Discharge planning Services  CM Consult  Post Acute Care Choice:    Choice offered to:     DME Arranged:    DME Agency:     HH Arranged:    [place] Agency:     Status of Service:  Completed  Medicare Important Message Given:  Yes Date Medicare IM Given:  10/15/14 Medicare IM give by:  debbie dowell rn,bsn Date Additional Medicare IM Given:  10/21/14 Additional Medicare Important Message give by:  Jasmine Pang RN MPH, case management [phone]  If discussed at Long Length of Stay Meetings, dates discussed:    Additional Comments:  Adron Bene, RN 10/27/2014, 11:06 AM

## 2014-10-27 NOTE — Progress Notes (Signed)
[facility] ROUNDING NOTE   Subjective:   Interval History: ambulated with PT yesterday  Very little urine output  Objective:  Vital signs in last 24 hours:  Temp:  [97.8 F (36.6 C)-98.4 F (36.9 C)] 98.4 F (36.9 C) (05/24 0952) Pulse Rate:  [88-91] 88 (05/24 0952) Resp:  [16-18] 17 (05/24 0952) BP: (117-126)/(79-88) 119/79 mmHg (05/24 0952) SpO2:  [96 %-100 %] 100 % (05/24 0952) Weight:  [43.545 kg (96 lb)] 43.545 kg (96 lb) (05/23 2121)  Weight change: 0.345 kg (12.2 oz) Filed Weights   10/24/14 2114 10/25/14 2225 10/26/14 2121  Weight: 43.1 kg (95 lb 0.3 oz) 43.2 kg (95 lb 3.8 oz) 43.545 kg (96 lb)    Intake/Output: I/O last 3 completed shifts: In: 600 [P.O.:600] Out: 0    Intake/Output this shift:  Total I/O In: 540 [P.O.:540] Out: 151 [Urine:150; Stool:1]  CVS- RRR RS- CTA ABD- BS present soft non-distended EXT- no edema   Basic Metabolic Panel:  Recent Labs Lab 10/21/14 1600 10/22/14 0944 10/22/14 1108 10/23/14 0714 10/26/14 1405  NA 134* 136 138 135 133*  K 4.3 3.8 3.6 3.6 3.8  CL 101 105  --  101 100*  CO2 28 24  --  26 25  GLUCOSE 120* 73 73 66 151*  BUN 6 <5*  --  5* 9  CREATININE 2.54* 1.75*  --  2.41* 2.84*  CALCIUM 7.6* 7.9*  --  8.0* 8.1*  MG  --  1.7  --   --   --   PHOS 2.8 2.1*  --  3.1 2.6    Liver Function Tests:  Recent Labs Lab 10/21/14 1600 10/22/14 0944 10/23/14 0714 10/26/14 1405  ALBUMIN 1.2* 1.6* 1.6* 1.5*   No results for input(s): LIPASE, AMYLASE in the last 168 hours. No results for input(s): AMMONIA in the last 168 hours.  CBC:  Recent Labs Lab 10/21/14 1600 10/22/14 0944 10/22/14 1108 10/23/14 0713 10/26/14 1405  WBC 4.8 4.4  --  5.9 7.8  HGB 12.6 7.6* 8.8* 7.6* 8.6*  HCT 40.3 24.3* 26.0* 24.7* 28.0*  MCV 94.4 94.2  --  96.1 97.2  PLT 36* 45*  --  97* 70*    Cardiac Enzymes: No results for input(s): CKTOTAL, CKMB, CKMBINDEX, TROPONINI in the last 168 hours.  BNP: Invalid  input(s): POCBNP  CBG: No results for input(s): GLUCAP in the last 168 hours.  Microbiology: Results for orders placed or performed during the hospital encounter of 10/12/14  Culture, blood (routine x 2)     Status: None   Collection Time: 10/12/14 10:04 PM  Result Value Ref Range Status   Specimen Description BLOOD LEFT ANTECUBITAL  Final   Special Requests BOTTLES DRAWN AEROBIC AND ANAEROBIC 5CC EA  Final   Culture   Final    NO GROWTH 5 DAYS Performed at [facility]    Report Status 10/19/2014 FINAL  Final  Culture, blood (routine x 2)     Status: None   Collection Time: 10/12/14 10:16 PM  Result Value Ref Range Status   Specimen Description BLOOD LEFT WRIST  Final   Special Requests BOTTLES DRAWN AEROBIC ONLY 3CC  Final   Culture   Final    NO GROWTH 5 DAYS Note: Culture results may be compromised due to an inadequate volume of blood received in culture bottles. Performed at [facility]    Report Status 10/19/2014 FINAL  Final  MRSA PCR Screening     Status: None  Collection Time: 10/12/14 11:40 PM  Result Value Ref Range Status   MRSA by PCR NEGATIVE NEGATIVE Final    Comment:        The GeneXpert MRSA Assay (FDA approved for NASAL specimens only), is one component of a comprehensive MRSA colonization surveillance program. It is not intended to diagnose MRSA infection nor to guide or monitor treatment for MRSA infections.   Urine culture     Status: None   Collection Time: 10/13/14  4:12 AM  Result Value Ref Range Status   Specimen Description URINE, RANDOM  Final   Special Requests NONE  Final   Colony Count   Final    >=100,000 COLONIES/ML Performed at [facility]    Culture   Final    Multiple bacterial morphotypes present, none predominant. Suggest appropriate recollection if clinically indicated. Performed at [facility]    Report Status 10/14/2014 FINAL  Final  Body fluid culture     Status: None   Collection  Time: 10/13/14  1:18 PM  Result Value Ref Range Status   Specimen Description FLUID RIGHT PLEURAL  Final   Special Requests NONE  Final   Gram Stain   Final    RARE WBC PRESENT, PREDOMINANTLY MONONUCLEAR NO ORGANISMS SEEN Performed at [facility]    Culture   Final    NO GROWTH 3 DAYS Performed at [facility]    Report Status 10/17/2014 FINAL  Final  MRSA PCR Screening     Status: None   Collection Time: 10/18/14  7:45 AM  Result Value Ref Range Status   MRSA by PCR NEGATIVE NEGATIVE Final    Comment:        The GeneXpert MRSA Assay (FDA approved for NASAL specimens only), is one component of a comprehensive MRSA colonization surveillance program. It is not intended to diagnose MRSA infection nor to guide or monitor treatment for MRSA infections.     Coagulation Studies: No results for input(s): LABPROT, INR in the last 72 hours.  Urinalysis: No results for input(s): COLORURINE, LABSPEC, PHURINE, GLUCOSEU, HGBUR, BILIRUBINUR, KETONESUR, PROTEINUR, UROBILINOGEN, NITRITE, LEUKOCYTESUR in the last 72 hours.  Invalid input(s): APPERANCEUR    Imaging: No results found.   Medications:   . sodium chloride Stopped (10/22/14 1215)   . sodium chloride   Intravenous Once  . antiseptic oral rinse  7 mL Mouth Rinse BID  . calcitRIOL  0.25 mcg Oral Daily  . calcium carbonate  6 tablet Oral QHS  . darbepoetin (ARANESP) injection - DIALYSIS  200 mcg Intravenous Q Wed-HD  . feeding supplement (NEPRO CARB STEADY)  237 mL Oral TID WC  . FLUoxetine  10 mg Oral Daily  . hydroxychloroquine  400 mg Oral Daily  . multivitamin  1 tablet Oral QHS  . predniSONE  5 mg Oral Q breakfast  . ramelteon  8 mg Oral QHS  . vitamin B-12  250 mcg Oral QPM   sodium chloride, sodium chloride, camphor-menthol, feeding supplement (NEPRO CARB STEADY), heparin, lidocaine (PF), lidocaine-prilocaine, loperamide, oxyCODONE, pentafluoroprop-tetrafluoroeth  Assessment/ Plan:    Acute Renal failure Has improved with dialysis on admission it appears that she had oliguria, acidosis and hyperkalemia. Renal biopsy showed scar tissue Baseline creatinine 1.5  Access evaluated by two surgeons. They are concerned about viability of AV graft. We shall dialyze with a catheter for now and schedule outpatient appointment with [facility]   I believe that she will be dialysis dependent  Disposition will try to place  in [place] area to receive dialysis in center close to nursing facility  LOS: 15 Fidelia Cathers W @TODAY @12 :17 PM

## 2014-10-28 LAB — CBC
HEMATOCRIT: 25.7 % — AB (ref 36.0–46.0)
Hemoglobin: 7.7 g/dL — ABNORMAL LOW (ref 12.0–15.0)
MCH: 28.9 pg (ref 26.0–34.0)
MCHC: 30 g/dL (ref 30.0–36.0)
MCV: 96.6 fL (ref 78.0–100.0)
Platelets: 68 10*3/uL — ABNORMAL LOW (ref 150–400)
RBC: 2.66 MIL/uL — ABNORMAL LOW (ref 3.87–5.11)
RDW: 20.7 % — AB (ref 11.5–15.5)
WBC: 6.2 10*3/uL (ref 4.0–10.5)

## 2014-10-28 LAB — RENAL FUNCTION PANEL
Albumin: 1.4 g/dL — ABNORMAL LOW (ref 3.5–5.0)
Anion gap: 7 (ref 5–15)
BUN: 15 mg/dL (ref 6–20)
CALCIUM: 8.2 mg/dL — AB (ref 8.9–10.3)
CHLORIDE: 99 mmol/L — AB (ref 101–111)
CO2: 26 mmol/L (ref 22–32)
Creatinine, Ser: 3.47 mg/dL — ABNORMAL HIGH (ref 0.44–1.00)
GFR calc non Af Amer: 14 mL/min — ABNORMAL LOW (ref >60)
GFR, EST AFRICAN AMERICAN: 16 mL/min — AB (ref >60)
Glucose, Bld: 86 mg/dL (ref 65–99)
PHOSPHORUS: 3 mg/dL (ref 2.5–4.6)
Potassium: 3.6 mmol/L (ref 3.5–5.1)
Sodium: 132 mmol/L — ABNORMAL LOW (ref 135–145)

## 2014-10-28 MED ORDER — HEPARIN SODIUM (PORCINE) 1000 UNIT/ML DIALYSIS: 40.0000 [IU]/kg | Freq: Once | INTRAMUSCULAR | Status: DC

## 2014-10-28 MED ORDER — DARBEPOETIN ALFA 200 MCG/0.4ML IJ SOSY
PREFILLED_SYRINGE | INTRAMUSCULAR | Status: AC
  Filled 2014-10-28: qty 0.4

## 2014-10-28 MED ORDER — ALTEPLASE 2 MG IJ SOLR: 2.0000 mg | Freq: Once | INTRAMUSCULAR | Status: DC | PRN

## 2014-10-28 MED ORDER — SODIUM CHLORIDE 0.9 % IV SOLN: 100.0000 mL | INTRAVENOUS | Status: DC | PRN

## 2014-10-28 MED ORDER — HEPARIN SODIUM (PORCINE) 1000 UNIT/ML DIALYSIS: 1000.0000 [IU] | INTRAMUSCULAR | Status: DC | PRN

## 2014-10-28 MED ORDER — LIDOCAINE-PRILOCAINE 2.5-2.5 % EX CREA: 1.0000 "application " | TOPICAL_CREAM | CUTANEOUS | Status: DC | PRN

## 2014-10-28 MED ORDER — LIDOCAINE HCL (PF) 1 % IJ SOLN: 5.0000 mL | INTRAMUSCULAR | Status: DC | PRN

## 2014-10-28 MED ORDER — NEPRO/CARBSTEADY PO LIQD: 237.0000 mL | ORAL | Status: DC | PRN

## 2014-10-28 MED ORDER — PENTAFLUOROPROP-TETRAFLUOROETH EX AERO: 1.0000 "application " | INHALATION_SPRAY | CUTANEOUS | Status: DC | PRN

## 2014-10-28 NOTE — Procedures (Signed)
I have seen and examined this patient and agree with the plan of care . Patient appears stable on dialysis . Awaiting placement  [facility] W 10/28/2014, 10:32 AM

## 2014-10-28 NOTE — Progress Notes (Signed)
Subjective:    The patient reports being more short of breath this morning. She reports been excited about the possibility of going to a SNF and receiving outpatient dialysis.  Interval Events: -Afebrile, vital signs stable. -Saturations slightly lower today. -Only 150 mL urine output over past 3 days.   Objective:    Vital Signs:   Temp:  [97.9 F (36.6 C)-98.6 F (37 C)] 98 F (36.7 C) (05/25 0537) Pulse Rate:  [88-93] 88 (05/25 0537) Resp:  [16-18] 17 (05/25 0537) BP: (118-125)/(79-86) 124/86 mmHg (05/25 0537) SpO2:  [96 %-100 %] 96 % (05/25 0537) Weight:  [96 lb 3.2 oz (43.636 kg)] 96 lb 3.2 oz (43.636 kg) (05/24 2033) Last BM Date: 10/27/14  24-hour weight change: Weight change: 3.2 oz (0.091 kg)  Intake/Output:   Intake/Output Summary (Last 24 hours) at 10/28/14 0848 Last data filed at 10/28/14 0616  Gross per 24 hour  Intake   1200 ml  Output    151 ml  Net   1049 ml      Physical Exam: General: Thin, cachectic appearing, alert, in no acute distress.  Lungs:  Reduced breath sounds and crackles at bilateral lung bases, R>L-worsened.   Heart: Regular rate and rhythm, S1 and S2 normal without gallop, murmur, or rubs.  Abdomen:  BS normoactive, nontender, nondistended.  Extremities: No pretibial edema, no tenderness to palpation.   Labs:  Basic Metabolic Panel:  Recent Labs Lab 10/21/14 1600 10/22/14 0944 10/22/14 1108 10/23/14 0714 10/26/14 1405  NA 134* 136 138 135 133*  K 4.3 3.8 3.6 3.6 3.8  CL 101 105  --  101 100*  CO2 28 24  --  26 25  GLUCOSE 120* 73 73 66 151*  BUN 6 <5*  --  5* 9  CREATININE 2.54* 1.75*  --  2.41* 2.84*  CALCIUM 7.6* 7.9*  --  8.0* 8.1*  MG  --  1.7  --   --   --   PHOS 2.8 2.1*  --  3.1 2.6    Liver Function Tests:  Recent Labs Lab 10/21/14 1600 10/22/14 0944 10/23/14 0714 10/26/14 1405  ALBUMIN 1.2* 1.6* 1.6* 1.5*   CBC:  Recent Labs Lab 10/21/14 1600 10/22/14 0944 10/22/14 1108 10/23/14 0713  10/26/14 1405  WBC 4.8 4.4  --  5.9 7.8  HGB 12.6 7.6* 8.8* 7.6* 8.6*  HCT 40.3 24.3* 26.0* 24.7* 28.0*  MCV 94.4 94.2  --  96.1 97.2  PLT 36* 45*  --  97* 70*    Iron/TIBC/Ferritin/ %Sat    Component Value Date/Time   IRON 120 10/20/2014 0353   TIBC 143* 10/20/2014 0353   FERRITIN 1356* 10/20/2014 0353   IRONPCTSAT 84* 10/20/2014 0353   IRONPCTSAT NOT CALC 06/18/2014 1620   Microbiology: Results for orders placed or performed during the hospital encounter of 10/12/14  Culture, blood (routine x 2)     Status: None   Collection Time: 10/12/14 10:04 PM  Result Value Ref Range Status   Specimen Description BLOOD LEFT ANTECUBITAL  Final   Special Requests BOTTLES DRAWN AEROBIC AND ANAEROBIC 5CC EA  Final   Culture   Final    NO GROWTH 5 DAYS Performed at [facility]    Report Status 10/19/2014 FINAL  Final  Culture, blood (routine x 2)     Status: None   Collection Time: 10/12/14 10:16 PM  Result Value Ref Range Status   Specimen Description BLOOD LEFT WRIST  Final   Special Requests  BOTTLES DRAWN AEROBIC ONLY 3CC  Final   Culture   Final    NO GROWTH 5 DAYS Note: Culture results may be compromised due to an inadequate volume of blood received in culture bottles. Performed at [facility]    Report Status 10/19/2014 FINAL  Final  MRSA PCR Screening     Status: None   Collection Time: 10/12/14 11:40 PM  Result Value Ref Range Status   MRSA by PCR NEGATIVE NEGATIVE Final    Comment:        The GeneXpert MRSA Assay (FDA approved for NASAL specimens only), is one component of a comprehensive MRSA colonization surveillance program. It is not intended to diagnose MRSA infection nor to guide or monitor treatment for MRSA infections.   Urine culture     Status: None   Collection Time: 10/13/14  4:12 AM  Result Value Ref Range Status   Specimen Description URINE, RANDOM  Final   Special Requests NONE  Final   Colony Count   Final    >=100,000  COLONIES/ML Performed at [facility]    Culture   Final    Multiple bacterial morphotypes present, none predominant. Suggest appropriate recollection if clinically indicated. Performed at [facility]    Report Status 10/14/2014 FINAL  Final  Body fluid culture     Status: None   Collection Time: 10/13/14  1:18 PM  Result Value Ref Range Status   Specimen Description FLUID RIGHT PLEURAL  Final   Special Requests NONE  Final   Gram Stain   Final    RARE WBC PRESENT, PREDOMINANTLY MONONUCLEAR NO ORGANISMS SEEN Performed at [facility]    Culture   Final    NO GROWTH 3 DAYS Performed at [facility]    Report Status 10/17/2014 FINAL  Final  MRSA PCR Screening     Status: None   Collection Time: 10/18/14  7:45 AM  Result Value Ref Range Status   MRSA by PCR NEGATIVE NEGATIVE Final    Comment:        The GeneXpert MRSA Assay (FDA approved for NASAL specimens only), is one component of a comprehensive MRSA colonization surveillance program. It is not intended to diagnose MRSA infection nor to guide or monitor treatment for MRSA infections.    Imaging: No results found.     Medications:    Infusions: . sodium chloride Stopped (10/22/14 1215)    Scheduled Medications: . sodium chloride   Intravenous Once  . antiseptic oral rinse  7 mL Mouth Rinse BID  . calcitRIOL  0.25 mcg Oral Daily  . calcium carbonate  6 tablet Oral QHS  . darbepoetin (ARANESP) injection - DIALYSIS  200 mcg Intravenous Q Wed-HD  . feeding supplement (NEPRO CARB STEADY)  237 mL Oral TID WC  . FLUoxetine  10 mg Oral Daily  . hydroxychloroquine  400 mg Oral Daily  . multivitamin  1 tablet Oral QHS  . predniSONE  5 mg Oral Q breakfast  . ramelteon  8 mg Oral QHS  . vitamin B-12  250 mcg Oral QPM    PRN Medications: sodium chloride, sodium chloride, camphor-menthol, feeding supplement (NEPRO CARB STEADY), heparin, lidocaine (PF), lidocaine-prilocaine,  loperamide, oxyCODONE, pentafluoroprop-tetrafluoroeth   Assessment/ Plan:    Principal Problem:   End stage renal disease Active Problems:   Alcohol abuse   FSGS (focal segmental glomerulosclerosis)   Anemia of chronic disease   Lupus (systemic lupus erythematosus)   Elevated troponin  Electrolyte abnormality   Metabolic acidosis   Coagulopathy   ESRD (end stage renal disease) on dialysis  #Volume overload secondary to ESRD The patient continues to have poor urine output after stopping dialysis. She is having some increased shortness of breath and increasing crackles on exam, consistent with fluid retention in her lungs. -Resume dialysis when nephrology recommends. -Social work continues to look for a SNF in the [place] area. -Dialysis social worker working on dialysis placement in [place]. -Patient will need to complete the hepatitis B series. -Oxygen for comfort. -Renal diet.  #Alcoholic liver disease and severe malnutrition -Continue to work with PT. -Continue feeding supplement per nutrition recommendations. -Continue thiamine, folate, B-12.  #SLE -Continue prednisone 5 mg daily. -Continue hydroxychloroquine 400 mg daily. -Patient will need an eye exam in the next year. -Continue oxycodone 5 mg every 4 hours when necessary.  #Itching/dry skin -Continue Sarna lotion.  #Electrolyte abnormalities -Continue to monitor.  #CKD related metabolic bone disease -Continue home calcium carbonate 1200 mg 3 times a day. -Continue home calcitriol 0.25 g daily.  #Anemia of chronic disease -Continue Aranesp.  #Prolonged QTc -Avoid QT prolonging medications.  #Hypertension -Will not need home amlodipine and metoprolol at discharge.  #Depression -Continue home fluoxetine 10 mg daily.  #Insomnia -Continue ramelteon 8 mg daily at bedtime.   DVT PPX - SCD's while in bed  CODE STATUS - Full.  CONSULTS PLACED - Nephrology, vascular surgery.  DISPO - Discharge to  SNF in [place] when bed available and outpatient dialysis arranged.  The patient does have a current PCP Eulas Post, Brett Canales, MD) and does need an [facility] hospital follow-up appointment after discharge.    Is the [facility] hospital follow-up appointment a one-time only appointment? no.  Does the patient have transportation limitations that hinder transportation to clinic appointments? yes   SERVICE NEEDED AT [facility]         Y = Yes, Blank = No PT: SNF  OT: SNF  RN:   Equipment: 3 in 1, rolling walker  Other:      Length of Stay: 16 day(s)   Signed: Charlesetta Shanks, MD  PGY-1, Internal Medicine Resident Pager: [phone] (7AM-5PM) 10/28/2014, 8:48 AM

## 2014-10-28 NOTE — Progress Notes (Signed)
[name], female   DOB: 1956-03-11, 59 y.o.   MRN: [id number] Medicine attending: I examined this patient this morning together with resident physician Dr. Karle Starch Moding and I concur with his evaluation and plan. She is back on hemodialysis at this time. Discharge planning active.

## 2014-10-29 ENCOUNTER — Inpatient Hospital Stay (HOSPITAL_COMMUNITY): Payer: Medicare Other

## 2014-10-29 DIAGNOSIS — R262 Difficulty in walking, not elsewhere classified: Secondary | ICD-10-CM | POA: Diagnosis not present

## 2014-10-29 DIAGNOSIS — D709 Neutropenia, unspecified: Secondary | ICD-10-CM | POA: Diagnosis not present

## 2014-10-29 DIAGNOSIS — I10 Essential (primary) hypertension: Secondary | ICD-10-CM | POA: Diagnosis not present

## 2014-10-29 DIAGNOSIS — E872 Acidosis, unspecified: Secondary | ICD-10-CM | POA: Insufficient documentation

## 2014-10-29 DIAGNOSIS — D649 Anemia, unspecified: Secondary | ICD-10-CM | POA: Diagnosis not present

## 2014-10-29 DIAGNOSIS — Z79899 Other long term (current) drug therapy: Secondary | ICD-10-CM | POA: Diagnosis not present

## 2014-10-29 DIAGNOSIS — R109 Unspecified abdominal pain: Secondary | ICD-10-CM | POA: Diagnosis not present

## 2014-10-29 DIAGNOSIS — N051 Unspecified nephritic syndrome with focal and segmental glomerular lesions: Secondary | ICD-10-CM | POA: Diagnosis not present

## 2014-10-29 DIAGNOSIS — N184 Chronic kidney disease, stage 4 (severe): Secondary | ICD-10-CM | POA: Diagnosis not present

## 2014-10-29 DIAGNOSIS — M6281 Muscle weakness (generalized): Secondary | ICD-10-CM | POA: Diagnosis not present

## 2014-10-29 DIAGNOSIS — J90 Pleural effusion, not elsewhere classified: Secondary | ICD-10-CM | POA: Insufficient documentation

## 2014-10-29 DIAGNOSIS — D509 Iron deficiency anemia, unspecified: Secondary | ICD-10-CM | POA: Diagnosis not present

## 2014-10-29 DIAGNOSIS — J918 Pleural effusion in other conditions classified elsewhere: Secondary | ICD-10-CM

## 2014-10-29 DIAGNOSIS — I120 Hypertensive chronic kidney disease with stage 5 chronic kidney disease or end stage renal disease: Secondary | ICD-10-CM | POA: Diagnosis not present

## 2014-10-29 DIAGNOSIS — E440 Moderate protein-calorie malnutrition: Secondary | ICD-10-CM | POA: Diagnosis not present

## 2014-10-29 DIAGNOSIS — N179 Acute kidney failure, unspecified: Secondary | ICD-10-CM | POA: Diagnosis not present

## 2014-10-29 DIAGNOSIS — M329 Systemic lupus erythematosus, unspecified: Secondary | ICD-10-CM | POA: Diagnosis not present

## 2014-10-29 DIAGNOSIS — D631 Anemia in chronic kidney disease: Secondary | ICD-10-CM | POA: Diagnosis not present

## 2014-10-29 DIAGNOSIS — Z452 Encounter for adjustment and management of vascular access device: Secondary | ICD-10-CM | POA: Diagnosis not present

## 2014-10-29 DIAGNOSIS — J9811 Atelectasis: Secondary | ICD-10-CM | POA: Diagnosis not present

## 2014-10-29 DIAGNOSIS — R079 Chest pain, unspecified: Secondary | ICD-10-CM | POA: Diagnosis not present

## 2014-10-29 DIAGNOSIS — J984 Other disorders of lung: Secondary | ICD-10-CM | POA: Diagnosis not present

## 2014-10-29 DIAGNOSIS — R600 Localized edema: Secondary | ICD-10-CM | POA: Diagnosis not present

## 2014-10-29 DIAGNOSIS — N186 End stage renal disease: Secondary | ICD-10-CM | POA: Diagnosis not present

## 2014-10-29 DIAGNOSIS — M3214 Glomerular disease in systemic lupus erythematosus: Secondary | ICD-10-CM | POA: Diagnosis not present

## 2014-10-29 DIAGNOSIS — Z992 Dependence on renal dialysis: Secondary | ICD-10-CM | POA: Diagnosis not present

## 2014-10-29 DIAGNOSIS — N61 Inflammatory disorders of breast: Secondary | ICD-10-CM | POA: Diagnosis not present

## 2014-10-29 DIAGNOSIS — M3210 Systemic lupus erythematosus, organ or system involvement unspecified: Secondary | ICD-10-CM | POA: Diagnosis not present

## 2014-10-29 DIAGNOSIS — D689 Coagulation defect, unspecified: Secondary | ICD-10-CM | POA: Diagnosis not present

## 2014-10-29 DIAGNOSIS — E876 Hypokalemia: Secondary | ICD-10-CM | POA: Diagnosis not present

## 2014-10-29 DIAGNOSIS — E875 Hyperkalemia: Secondary | ICD-10-CM | POA: Diagnosis not present

## 2014-10-29 DIAGNOSIS — I5042 Chronic combined systolic (congestive) and diastolic (congestive) heart failure: Secondary | ICD-10-CM | POA: Diagnosis not present

## 2014-10-29 DIAGNOSIS — N041 Nephrotic syndrome with focal and segmental glomerular lesions: Secondary | ICD-10-CM | POA: Diagnosis not present

## 2014-10-29 DIAGNOSIS — J9620 Acute and chronic respiratory failure, unspecified whether with hypoxia or hypercapnia: Secondary | ICD-10-CM | POA: Diagnosis not present

## 2014-10-29 DIAGNOSIS — R0600 Dyspnea, unspecified: Secondary | ICD-10-CM | POA: Insufficient documentation

## 2014-10-29 MED ORDER — RAMELTEON 8 MG PO TABS: 8.0000 mg | ORAL_TABLET | Freq: Every day | ORAL | Status: DC

## 2014-10-29 MED ORDER — HYDROXYCHLOROQUINE SULFATE 200 MG PO TABS: 400.0000 mg | ORAL_TABLET | Freq: Every day | ORAL | Status: DC

## 2014-10-29 MED ORDER — NEPRO/CARBSTEADY PO LIQD: 237.0000 mL | ORAL | Status: DC | PRN

## 2014-10-29 MED ORDER — CAMPHOR-MENTHOL 0.5-0.5 % EX LOTN: TOPICAL_LOTION | CUTANEOUS | Status: DC | PRN

## 2014-10-29 MED ORDER — OXYCODONE HCL 5 MG PO TABS: 5.0000 mg | ORAL_TABLET | Freq: Four times a day (QID) | ORAL | Status: DC | PRN

## 2014-10-29 MED ORDER — DARBEPOETIN ALFA 200 MCG/0.4ML IJ SOSY: 200.0000 ug | PREFILLED_SYRINGE | INTRAMUSCULAR | Status: DC

## 2014-10-29 MED ORDER — NEPRO/CARBSTEADY PO LIQD: 237.0000 mL | Freq: Three times a day (TID) | ORAL | Status: DC

## 2014-10-29 MED ORDER — PREDNISONE 5 MG PO TABS: 5.0000 mg | ORAL_TABLET | Freq: Every day | ORAL | Status: DC

## 2014-10-29 MED ORDER — CALCIUM CARBONATE ANTACID 500 MG PO CHEW: 6.0000 | CHEWABLE_TABLET | Freq: Every day | ORAL | Status: DC

## 2014-10-29 NOTE — Progress Notes (Signed)
Subjective:    The patient reports feeling better this morning after receiving dialysis yesterday.  She denies any shortness of breath currently.  Interval Events: -Afebrile, vital signs stable. -Dialysis yesterday with 2 L off.   Objective:    Vital Signs:   Temp:  [97.5 F (36.4 C)-99.1 F (37.3 C)] 98.3 F (36.8 C) (05/25 1949) Pulse Rate:  [80-93] 93 (05/25 1949) Resp:  [14-16] 14 (05/25 1949) BP: (111-146)/(73-102) 114/82 mmHg (05/25 1949) SpO2:  [92 %-99 %] 93 % (05/25 1949) Weight:  [92 lb 6 oz (41.9 kg)-96 lb 12.5 oz (43.9 kg)] 94 lb 9.2 oz (42.9 kg) (05/26 0500) Last BM Date: 10/29/14  24-hour weight change: Weight change: 9.3 oz (0.264 kg)  Intake/Output:   Intake/Output Summary (Last 24 hours) at 10/29/14 0855 Last data filed at 10/28/14 2319  Gross per 24 hour  Intake    480 ml  Output   2102 ml  Net  -1622 ml      Physical Exam: General: Thin, cachectic appearing, alert, in no acute distress.  Lungs:  Reduced breath sounds at bilateral lung bases, R>L.  Decreased resonance to percussion at R lung base.   Heart: Regular rate and rhythm, S1 and S2 normal without gallop, murmur, or rubs.  Abdomen:  BS normoactive, nontender, nondistended.  Extremities: No pretibial edema, no tenderness to palpation.   Labs:  Basic Metabolic Panel:  Recent Labs Lab 10/22/14 0944 10/22/14 1108 10/23/14 0714 10/26/14 1405 10/28/14 0930  NA 136 138 135 133* 132*  K 3.8 3.6 3.6 3.8 3.6  CL 105  --  101 100* 99*  CO2 24  --  _0 GLUCOSE 73 73 66 151* 86  BUN <5*  --  5* 9 15  CREATININE 1.75*  --  2.41* 2.84* 3.47*  CALCIUM 7.9*  --  8.0* 8.1* 8.2*  MG 1.7  --   --   --   --   PHOS 2.1*  --  3.1 2.6 3.0    Liver Function Tests:  Recent Labs Lab 10/22/14 0944 10/23/14 0714 10/26/14 1405 10/28/14 0930  ALBUMIN 1.6* 1.6* 1.5* 1.4*   CBC:  Recent Labs Lab 10/22/14 0944 10/22/14 1108 10/23/14 0713 10/26/14 1405 10/28/14 0930  WBC 4.4  --   5.9 7.8 6.2  HGB 7.6* 8.8* 7.6* 8.6* 7.7*  HCT 24.3* 26.0* 24.7* 28.0* 25.7*  MCV 94.2  --  96.1 97.2 96.6  PLT 45*  --  97* 70* 68*    Iron/TIBC/Ferritin/ %Sat    Component Value Date/Time   IRON 120 10/20/2014 0353   TIBC 143* 10/20/2014 0353   FERRITIN 1356* 10/20/2014 0353   IRONPCTSAT 84* 10/20/2014 0353   IRONPCTSAT NOT CALC 06/18/2014 1620   Microbiology: Results for orders placed or performed during the hospital encounter of 10/12/14  Culture, blood (routine x 2)     Status: None   Collection Time: 10/12/14 10:04 PM  Result Value Ref Range Status   Specimen Description BLOOD LEFT ANTECUBITAL  Final   Special Requests BOTTLES DRAWN AEROBIC AND ANAEROBIC 5CC EA  Final   Culture   Final    NO GROWTH 5 DAYS Performed at [facility]    Report Status 10/19/2014 FINAL  Final  Culture, blood (routine x 2)     Status: None   Collection Time: 10/12/14 10:16 PM  Result Value Ref Range Status   Specimen Description BLOOD LEFT WRIST  Final   Special Requests BOTTLES DRAWN AEROBIC  ONLY 3CC  Final   Culture   Final    NO GROWTH 5 DAYS Note: Culture results may be compromised due to an inadequate volume of blood received in culture bottles. Performed at [facility]    Report Status 10/19/2014 FINAL  Final  MRSA PCR Screening     Status: None   Collection Time: 10/12/14 11:40 PM  Result Value Ref Range Status   MRSA by PCR NEGATIVE NEGATIVE Final    Comment:        The GeneXpert MRSA Assay (FDA approved for NASAL specimens only), is one component of a comprehensive MRSA colonization surveillance program. It is not intended to diagnose MRSA infection nor to guide or monitor treatment for MRSA infections.   Urine culture     Status: None   Collection Time: 10/13/14  4:12 AM  Result Value Ref Range Status   Specimen Description URINE, RANDOM  Final   Special Requests NONE  Final   Colony Count   Final    >=100,000 COLONIES/ML Performed at [facility]    Culture   Final    Multiple bacterial morphotypes present, none predominant. Suggest appropriate recollection if clinically indicated. Performed at [facility]    Report Status 10/14/2014 FINAL  Final  Body fluid culture     Status: None   Collection Time: 10/13/14  1:18 PM  Result Value Ref Range Status   Specimen Description FLUID RIGHT PLEURAL  Final   Special Requests NONE  Final   Gram Stain   Final    RARE WBC PRESENT, PREDOMINANTLY MONONUCLEAR NO ORGANISMS SEEN Performed at [facility]    Culture   Final    NO GROWTH 3 DAYS Performed at [facility]    Report Status 10/17/2014 FINAL  Final  MRSA PCR Screening     Status: None   Collection Time: 10/18/14  7:45 AM  Result Value Ref Range Status   MRSA by PCR NEGATIVE NEGATIVE Final    Comment:        The GeneXpert MRSA Assay (FDA approved for NASAL specimens only), is one component of a comprehensive MRSA colonization surveillance program. It is not intended to diagnose MRSA infection nor to guide or monitor treatment for MRSA infections.    Imaging: No results found.     Medications:    Infusions: . sodium chloride Stopped (10/22/14 1215)    Scheduled Medications: . sodium chloride   Intravenous Once  . antiseptic oral rinse  7 mL Mouth Rinse BID  . calcitRIOL  0.25 mcg Oral Daily  . calcium carbonate  6 tablet Oral QHS  . darbepoetin (ARANESP) injection - DIALYSIS  200 mcg Intravenous Q Wed-HD  . feeding supplement (NEPRO CARB STEADY)  237 mL Oral TID WC  . FLUoxetine  10 mg Oral Daily  . hydroxychloroquine  400 mg Oral Daily  . multivitamin  1 tablet Oral QHS  . predniSONE  5 mg Oral Q breakfast  . ramelteon  8 mg Oral QHS  . vitamin B-12  250 mcg Oral QPM    PRN Medications: sodium chloride, sodium chloride, camphor-menthol, feeding supplement (NEPRO CARB STEADY), heparin, lidocaine (PF), lidocaine-prilocaine, loperamide, oxyCODONE,  pentafluoroprop-tetrafluoroeth   Assessment/ Plan:    Principal Problem:   End stage renal disease Active Problems:   Alcohol abuse   FSGS (focal segmental glomerulosclerosis)   Anemia of chronic disease   Lupus (systemic lupus erythematosus)   Elevated troponin   Electrolyte abnormality  Metabolic acidosis   Coagulopathy   ESRD (end stage renal disease) on dialysis  #Volume overload secondary to ESRD The patient feels better after resuming dialysis yesterday.  She was resting comfortably off of oxygen this morning.  Talked to dialysis secretary, and patient has reportedly been accepted to [facility] dialysis center.  She will just need to be accepted to a SNF in the [place] area.  Talked to social work who will follow up with the SNFs this morning. -Continued dialysis per nephrology. -Social work continues to look for a SNF in the [place] area. -Outpatient dialysis can be arranged at [facility] once SNF secured. -Patient will need to complete the hepatitis B series as an outpatient. -Oxygen for comfort. -Renal diet.  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 This is most likely secondary to her alcoholic liver disease and volume overload from not receiving dialysis on Monday.  Previously drained on admission and consistent with a transudate with no cells to suggest lupus pleuritis or other inflammatory cause.  She is minimally symptomatic, so treatment will be sodium restriction and dialysis (will have no effect from diuretics).  Previous imaging not consistent with cirrhosis, but [place] and CT approximately 80% sensitive.  Biopsy would be gold standard, but probably not necessary as her labs are highly suggestive of hepatic dysfunction with elevated INR, low platelets, elevated Alk Phos, and elevated LFTs. -Repeat chest x-ray today to evaluate extent of pleural effusion. -Continued management with  dialysis and renal diet. -Continue to work with PT. -Continue Nepro Carb Steady feeding supplement per nutrition recommendations. -Continue thiamine, folate, B-12.  #SLE -Continue prednisone 5 mg daily. -Continue hydroxychloroquine 400 mg daily. -Patient will need an eye exam in the next year. -Continue oxycodone 5 mg every 4 hours when necessary.  #Itching/dry skin -Continue Sarna lotion.  #CKD related metabolic bone disease -Continue home calcium carbonate 1200 mg 3 times a day. -Continue home calcitriol 0.25 g daily.  #Anemia of chronic disease -Continue Aranesp.  #Prolonged QTc -Avoid QT prolonging medications.  #Hypertension -Will not need home amlodipine and metoprolol at discharge.  #Depression -Continue home fluoxetine 10 mg daily.  #Insomnia -Continue ramelteon 8 mg daily at bedtime.   DVT PPX - SCD's while in bed  CODE STATUS - Full.  CONSULTS PLACED - Nephrology, vascular surgery.  DISPO - Discharge to SNF in [place] when bed available and outpatient dialysis arranged.  The patient does have a current PCP Eulas Post, Brett Canales, MD) and does need an [facility] hospital follow-up appointment after discharge.    Is the [facility] hospital follow-up appointment a one-time only appointment? no.  Does the patient have transportation limitations that hinder transportation to clinic appointments? yes   SERVICE NEEDED AT [place]         Y = Yes, Blank = No PT: SNF  OT: SNF  RN:   Equipment: 3 in 1, rolling walker  Other:      Length of Stay: 17 day(s)   Signed: Charlesetta Shanks, MD  PGY-1, Internal Medicine Resident Pager: [phone] (7AM-5PM) 10/29/2014, 8:55 AM

## 2014-10-29 NOTE — Progress Notes (Signed)
Physical Therapy Treatment Patient Details Name: [name] [name] MRN: [id number] DOB: [birth year] Today's Date: 10/29/2014    History of Present Illness Pt admitted with dyspnea and inability to take medications due to vomiting.  Pt also reports diarrhea. Now s/p thoracentesis and placement of central venous HD catheter. Pt discharged last week with HCAP. PMH: CKD, CHF, Lupus, pericardial window, ETOH window, HTN, depression.    PT Comments    Pt progressing well with mobility. She walked 240' with RW and 3L O2 without loss of balance. SaO2 was 82% on RA at rest, unable to obtain walking SaO2 levels due to poor signal on pulse ox. Pt was not dyspnic with ambulation.   Follow Up Recommendations  SNF;Supervision/Assistance - 24 hour     Equipment Recommendations  Rolling walker with 5" wheels;3in1 (PT)    Recommendations for Other Services       Precautions / Restrictions Precautions Precautions: Fall Precaution Comments: Fall precautions greatly reduced with use of RW Restrictions Weight Bearing Restrictions: No    Mobility  Bed Mobility Overal bed mobility: Independent       Supine to sit: Independent     General bed mobility comments: pt in chair  Transfers Overall transfer level: Modified independent Equipment used: Rolling walker (2 wheeled) Transfers: Sit to/from Stand Sit to Stand: Min guard         General transfer comment: bed at lowest height  Ambulation/Gait Ambulation/Gait assistance: Supervision Ambulation Distance (Feet): 240 Feet Assistive device: Rolling walker (2 wheeled)       General Gait Details: steady with RW, pt walked with 3L O2, O2 sats were 82% at rest on RA, unreliable O2 readings with ambulation due to poor signal   Stairs            Wheelchair Mobility    Modified Rankin (Stroke Patients Only)       Balance     Sitting balance-Leahy Scale: Good       Standing balance-Leahy Scale: Fair                       Cognition Arousal/Alertness: Awake/alert Behavior During Therapy: WFL for tasks assessed/performed Overall Cognitive Status: Within Functional Limits for tasks assessed                      Exercises      General Comments        Pertinent Vitals/Pain Pain Assessment: No/denies pain    Home Living Family/patient expects to be discharged to:: Skilled nursing facility                    Prior Function Level of Independence: Needs assistance  Gait / Transfers Assistance Needed: ambulated with cane, limited distances ADL's / Homemaking Assistance Needed: says daughter sometimes helps her get dressed and bathing, pt does cooking and cleaning, boyfriend helps with some cooking Comments: Pt stated that she walks "like a duck"   PT Goals (current goals can now be found in the care plan section) Acute Rehab PT Goals Patient Stated Goal: to be able to clean her home and be with her 2 grandkids PT Goal Formulation: With patient Time For Goal Achievement: 11/04/14 Potential to Achieve Goals: Good Progress towards PT goals: Progressing toward goals    Frequency  Min 3X/week    PT Plan Current plan remains appropriate    Co-evaluation             End of  Session Equipment Utilized During Treatment: Gait belt;Oxygen Activity Tolerance: Patient tolerated treatment well Patient left: in chair;with call bell/phone within reach     Time: 1204-1219 PT Time Calculation (min) (ACUTE ONLY): 15 min  Charges:  $Gait Training: 8-22 mins                    G Codes:      Philomena Doheny 10/29/2014, 1:40 PM [phone]

## 2014-10-29 NOTE — Progress Notes (Signed)
Occupational Therapy Treatment Patient Details Name: [name] [name] MRN: [id number] DOB: 21-Sep-1955 Today's Date: 10/29/2014    History of present illness Pt admitted with dyspnea and inability to take medications due to vomiting.  Pt also reports diarrhea. Now s/p thoracentesis and placement of central venous HD catheter. Pt discharged last week with HCAP. PMH: CKD, CHF, Lupus, pericardial window, ETOH window, HTN, depression.   OT comments  Plan is SNF this day  Follow Up Recommendations  SNF    Equipment Recommendations  Other (comment) (TBD at next venue of care)    Recommendations for Other Services      Precautions / Restrictions         Mobility Bed Mobility               General bed mobility comments: pt in chair  Transfers Overall transfer level: Needs assistance Equipment used: Rolling walker (2 wheeled) Transfers: Sit to/from Stand Sit to Stand: Min guard              Balance                                   ADL       Grooming: Standing;Wash/dry face;Supervision/safety;Set up                   Toilet Transfer: Min guard;Ambulation;BSC   Toileting- Water quality scientist and Hygiene: Min guard;Sit to/from stand       Functional mobility during ADLs: Minimal assistance        Vision                     Perception     Praxis      Cognition   Behavior During Therapy: WFL for tasks assessed/performed Overall Cognitive Status: Within Functional Limits for tasks assessed                       Extremity/Trunk Assessment  Upper Extremity Assessment Upper Extremity Assessment: Generalized weakness            Exercises     Shoulder Instructions       General Comments      Pertinent Vitals/ Pain       Pain Assessment: No/denies pain  Home Living Family/patient expects to be discharged to:: Skilled nursing facility                                        Prior  Functioning/Environment Level of Independence: Needs assistance  Gait / Transfers Assistance Needed: ambulated with cane, limited distances ADL's / Homemaking Assistance Needed: says daughter sometimes helps her get dressed and bathing, pt does cooking and cleaning, boyfriend helps with some cooking   Comments: Pt stated that she walks "like a duck"   Frequency Min 2X/week     Progress Toward Goals  OT Goals(current goals can now be found in the care plan section)  Progress towards OT goals: Progressing toward goals     Plan Discharge plan remains appropriate    Co-evaluation                 End of Session Equipment Utilized During Treatment: Rolling walker;Oxygen   Activity Tolerance Patient tolerated treatment well   Patient Left in bed;with call bell/phone within reach;with bed alarm  set   Nurse Communication          Time: [phone] OT Time Calculation (min): 19 min  Charges: OT General Charges $OT Visit: 1 Procedure OT Treatments $Self Care/Home Management : 8-22 mins  Hollace Michelli, Thereasa Parkin 10/29/2014, 12:57 PM

## 2014-10-29 NOTE — Progress Notes (Signed)
Patient will discharge to [facility] Anticipated discharge date: 10/29/14 Family notified:dtr at bedside Transportation by [facility]- called at 12:40pm  CSW signing off.  Domenica Reamer, [place] Social Worker [phone]

## 2014-10-29 NOTE — Progress Notes (Signed)
[name], female   DOB: 1956/01/25, 59 y.o.   MRN: [id number] Medicine attending: I examined this patient this morning together with resident physician Dr. Karle Starch Moding. She is asymptomatic at present but exam reveals likely reaccumulation of right pleural fluid. We are going to get a chest x-ray for further assessment. Additional problems and management plan accurate as recorded in his note.

## 2014-10-29 NOTE — Discharge Instructions (Signed)
·   Thank you for allowing [place] to be involved in your healthcare while you were hospitalized at [facility].   Please note that there have been changes to your home medications.  --> PLEASE LOOK AT YOUR DISCHARGE MEDICATION LIST FOR DETAILS.   Please call your PCP if you have any questions or concerns, or any difficulty getting any of your medications.  Please return to the ER if you have worsening of your symptoms or new severe symptoms arise.  It is critical that you try to increase your oral intake to gain weight so that vascular surgery will consider placing a graft or fistula for long term dialysis access.  You will need to complete the hepatitis vaccination series now that your are receiving dialysis.  We have started you on hydroxychlorquine for your lupus, and it appears to be helping with your pain.  Please continue on this medication along with prednisone to treat your lupus.  When you follow up with your PCP or rheumatology they may decide to taper you off of steroids.  Your first outpatient dialysis session is scheduled for tomorrow.

## 2014-10-29 NOTE — Progress Notes (Signed)
[facility] ROUNDING NOTE   Subjective:   Interval History:  Comfortable this morning  Objective:  Vital signs in last 24 hours:  Temp:  [97.5 F (36.4 C)-99.1 F (37.3 C)] 99 F (37.2 C) (05/26 0938) Pulse Rate:  [80-93] 88 (05/26 0938) Resp:  [14-16] 16 (05/26 0938) BP: (105-146)/(69-102) 105/69 mmHg (05/26 0938) SpO2:  [93 %-98 %] 93 % (05/26 0938) Weight:  [41.9 kg (92 lb 6 oz)-42.9 kg (94 lb 9.2 oz)] 42.9 kg (94 lb 9.2 oz) (05/26 0500)  Weight change: 0.264 kg (9.3 oz) Filed Weights   10/28/14 0920 10/28/14 1258 10/29/14 0500  Weight: 43.9 kg (96 lb 12.5 oz) 41.9 kg (92 lb 6 oz) 42.9 kg (94 lb 9.2 oz)    Intake/Output: I/O last 3 completed shifts: In: 840 [P.O.:720; NG/GT:120] Out: 2102 [Urine:100; Other:2000; Stool:2]   Intake/Output this shift:  Total I/O In: 240 [P.O.:240] Out: 0   CVS- RRR RS- CTA ABD- BS present soft non-distended EXT- no edema   Basic Metabolic Panel:  Recent Labs Lab 10/22/14 1108 10/23/14 0714 10/26/14 1405 10/28/14 0930  NA 138 135 133* 132*  K 3.6 3.6 3.8 3.6  CL  --  101 100* 99*  CO2  --  26 25 26   GLUCOSE 73 66 151* 86  BUN  --  5* 9 15  CREATININE  --  2.41* 2.84* 3.47*  CALCIUM  --  8.0* 8.1* 8.2*  PHOS  --  3.1 2.6 3.0    Liver Function Tests:  Recent Labs Lab 10/23/14 0714 10/26/14 1405 10/28/14 0930  ALBUMIN 1.6* 1.5* 1.4*   No results for input(s): LIPASE, AMYLASE in the last 168 hours. No results for input(s): AMMONIA in the last 168 hours.  CBC:  Recent Labs Lab 10/22/14 1108 10/23/14 0713 10/26/14 1405 10/28/14 0930  WBC  --  5.9 7.8 6.2  HGB 8.8* 7.6* 8.6* 7.7*  HCT 26.0* 24.7* 28.0* 25.7*  MCV  --  96.1 97.2 96.6  PLT  --  97* 70* 68*    Cardiac Enzymes: No results for input(s): CKTOTAL, CKMB, CKMBINDEX, TROPONINI in the last 168 hours.  BNP: Invalid input(s): POCBNP  CBG: No results for input(s): GLUCAP in the last 168 hours.  Microbiology: Results for orders  placed or performed during the hospital encounter of 10/12/14  Culture, blood (routine x 2)     Status: None   Collection Time: 10/12/14 10:04 PM  Result Value Ref Range Status   Specimen Description BLOOD LEFT ANTECUBITAL  Final   Special Requests BOTTLES DRAWN AEROBIC AND ANAEROBIC 5CC EA  Final   Culture   Final    NO GROWTH 5 DAYS Performed at [facility]    Report Status 10/19/2014 FINAL  Final  Culture, blood (routine x 2)     Status: None   Collection Time: 10/12/14 10:16 PM  Result Value Ref Range Status   Specimen Description BLOOD LEFT WRIST  Final   Special Requests BOTTLES DRAWN AEROBIC ONLY 3CC  Final   Culture   Final    NO GROWTH 5 DAYS Note: Culture results may be compromised due to an inadequate volume of blood received in culture bottles. Performed at [facility]    Report Status 10/19/2014 FINAL  Final  MRSA PCR Screening     Status: None   Collection Time: 10/12/14 11:40 PM  Result Value Ref Range Status   MRSA by PCR NEGATIVE NEGATIVE Final    Comment:  The GeneXpert MRSA Assay (FDA approved for NASAL specimens only), is one component of a comprehensive MRSA colonization surveillance program. It is not intended to diagnose MRSA infection nor to guide or monitor treatment for MRSA infections.   Urine culture     Status: None   Collection Time: 10/13/14  4:12 AM  Result Value Ref Range Status   Specimen Description URINE, RANDOM  Final   Special Requests NONE  Final   Colony Count   Final    >=100,000 COLONIES/ML Performed at [facility]    Culture   Final    Multiple bacterial morphotypes present, none predominant. Suggest appropriate recollection if clinically indicated. Performed at [facility]    Report Status 10/14/2014 FINAL  Final  Body fluid culture     Status: None   Collection Time: 10/13/14  1:18 PM  Result Value Ref Range Status   Specimen Description FLUID RIGHT PLEURAL  Final   Special  Requests NONE  Final   Gram Stain   Final    RARE WBC PRESENT, PREDOMINANTLY MONONUCLEAR NO ORGANISMS SEEN Performed at [facility]    Culture   Final    NO GROWTH 3 DAYS Performed at [facility]    Report Status 10/17/2014 FINAL  Final  MRSA PCR Screening     Status: None   Collection Time: 10/18/14  7:45 AM  Result Value Ref Range Status   MRSA by PCR NEGATIVE NEGATIVE Final    Comment:        The GeneXpert MRSA Assay (FDA approved for NASAL specimens only), is one component of a comprehensive MRSA colonization surveillance program. It is not intended to diagnose MRSA infection nor to guide or monitor treatment for MRSA infections.     Coagulation Studies: No results for input(s): LABPROT, INR in the last 72 hours.  Urinalysis: No results for input(s): COLORURINE, LABSPEC, PHURINE, GLUCOSEU, HGBUR, BILIRUBINUR, KETONESUR, PROTEINUR, UROBILINOGEN, NITRITE, LEUKOCYTESUR in the last 72 hours.  Invalid input(s): APPERANCEUR    Imaging: No results found.   Medications:   . sodium chloride Stopped (10/22/14 1215)   . sodium chloride   Intravenous Once  . antiseptic oral rinse  7 mL Mouth Rinse BID  . calcitRIOL  0.25 mcg Oral Daily  . calcium carbonate  6 tablet Oral QHS  . darbepoetin (ARANESP) injection - DIALYSIS  200 mcg Intravenous Q Wed-HD  . feeding supplement (NEPRO CARB STEADY)  237 mL Oral TID WC  . FLUoxetine  10 mg Oral Daily  . hydroxychloroquine  400 mg Oral Daily  . multivitamin  1 tablet Oral QHS  . predniSONE  5 mg Oral Q breakfast  . ramelteon  8 mg Oral QHS  . vitamin B-12  250 mcg Oral QPM   sodium chloride, sodium chloride, camphor-menthol, feeding supplement (NEPRO CARB STEADY), heparin, lidocaine (PF), lidocaine-prilocaine, loperamide, oxyCODONE, pentafluoroprop-tetrafluoroeth  Assessment/ Plan:   Acute Renal failure Has improved with dialysis on admission it appears that she had oliguria, acidosis and hyperkalemia.  Renal biopsy showed scar tissue Baseline creatinine 1.5  Access evaluated by two surgeons. They are concerned about viability of AV graft. We shall dialyze with a catheter for now and schedule outpatient appointment with [facility]  I believe that she will be dialysis dependent  Disposition will try to place in [place] area to receive dialysis in center close to nursing facility    LOS: 51 [name] [name] @TODAY @10 :55 AM

## 2014-10-30 DIAGNOSIS — Z452 Encounter for adjustment and management of vascular access device: Secondary | ICD-10-CM | POA: Diagnosis not present

## 2014-10-30 DIAGNOSIS — N186 End stage renal disease: Secondary | ICD-10-CM | POA: Diagnosis not present

## 2014-10-30 DIAGNOSIS — D689 Coagulation defect, unspecified: Secondary | ICD-10-CM | POA: Diagnosis not present

## 2014-10-30 NOTE — Patient Outreach (Signed)
[facility]) Care Management  10/30/2014  [name] [name] 06-10-1955 [id number]   Received notification from Natividad Brood, RN for community outreach while inpatient at [facility], assigned [employer], LCSW.  Ronnell Freshwater. [address], [place] Management [place] Assistant Phone: [phone] Fax: [phone]

## 2014-11-02 DIAGNOSIS — D689 Coagulation defect, unspecified: Secondary | ICD-10-CM | POA: Diagnosis not present

## 2014-11-02 DIAGNOSIS — Z452 Encounter for adjustment and management of vascular access device: Secondary | ICD-10-CM | POA: Diagnosis not present

## 2014-11-02 DIAGNOSIS — N186 End stage renal disease: Secondary | ICD-10-CM | POA: Diagnosis not present

## 2014-11-04 DIAGNOSIS — Z452 Encounter for adjustment and management of vascular access device: Secondary | ICD-10-CM | POA: Diagnosis not present

## 2014-11-04 DIAGNOSIS — E876 Hypokalemia: Secondary | ICD-10-CM | POA: Diagnosis not present

## 2014-11-04 DIAGNOSIS — D509 Iron deficiency anemia, unspecified: Secondary | ICD-10-CM | POA: Diagnosis not present

## 2014-11-04 DIAGNOSIS — D689 Coagulation defect, unspecified: Secondary | ICD-10-CM | POA: Diagnosis not present

## 2014-11-04 DIAGNOSIS — N186 End stage renal disease: Secondary | ICD-10-CM | POA: Diagnosis not present

## 2014-11-04 DIAGNOSIS — D631 Anemia in chronic kidney disease: Secondary | ICD-10-CM | POA: Diagnosis not present

## 2014-11-05 DIAGNOSIS — J90 Pleural effusion, not elsewhere classified: Secondary | ICD-10-CM | POA: Diagnosis not present

## 2014-11-05 DIAGNOSIS — D631 Anemia in chronic kidney disease: Secondary | ICD-10-CM | POA: Diagnosis not present

## 2014-11-05 DIAGNOSIS — N186 End stage renal disease: Secondary | ICD-10-CM | POA: Diagnosis not present

## 2014-11-05 DIAGNOSIS — M329 Systemic lupus erythematosus, unspecified: Secondary | ICD-10-CM | POA: Diagnosis not present

## 2014-11-06 DIAGNOSIS — E876 Hypokalemia: Secondary | ICD-10-CM | POA: Diagnosis not present

## 2014-11-06 DIAGNOSIS — N186 End stage renal disease: Secondary | ICD-10-CM | POA: Diagnosis not present

## 2014-11-06 DIAGNOSIS — D509 Iron deficiency anemia, unspecified: Secondary | ICD-10-CM | POA: Diagnosis not present

## 2014-11-06 DIAGNOSIS — D631 Anemia in chronic kidney disease: Secondary | ICD-10-CM | POA: Diagnosis not present

## 2014-11-06 DIAGNOSIS — Z452 Encounter for adjustment and management of vascular access device: Secondary | ICD-10-CM | POA: Diagnosis not present

## 2014-11-06 DIAGNOSIS — D689 Coagulation defect, unspecified: Secondary | ICD-10-CM | POA: Diagnosis not present

## 2014-11-09 ENCOUNTER — Ambulatory Visit: Payer: Self-pay | Admitting: Pulmonary Disease

## 2014-11-09 ENCOUNTER — Other Ambulatory Visit: Payer: Self-pay | Admitting: *Deleted

## 2014-11-09 DIAGNOSIS — E876 Hypokalemia: Secondary | ICD-10-CM | POA: Diagnosis not present

## 2014-11-09 DIAGNOSIS — Z452 Encounter for adjustment and management of vascular access device: Secondary | ICD-10-CM | POA: Diagnosis not present

## 2014-11-09 DIAGNOSIS — D631 Anemia in chronic kidney disease: Secondary | ICD-10-CM | POA: Diagnosis not present

## 2014-11-09 DIAGNOSIS — D509 Iron deficiency anemia, unspecified: Secondary | ICD-10-CM | POA: Diagnosis not present

## 2014-11-09 DIAGNOSIS — D689 Coagulation defect, unspecified: Secondary | ICD-10-CM | POA: Diagnosis not present

## 2014-11-09 DIAGNOSIS — N186 End stage renal disease: Secondary | ICD-10-CM | POA: Diagnosis not present

## 2014-11-09 NOTE — Patient Outreach (Signed)
[facility]) Care Management  11/09/2014  [name] [name] 09/11/1955 [id number]  CSW received a new referral on patient from [facility] Liaison with [place] [facility], indicating that patient may benefit from social work services and resources.  Mrs. Brewer went on to say that patient has been hospitalized three times within the last six months.  Mrs. Brewer encouraged CSW to follow patient while residing at [place] where patient was placed upon discharge from [place] made an initial attempt to try and contact patient today to perform phone assessment, as well as assess and assist with social work needs and services; however, patient was unavailable at the time of CSW's call.  A HIPAA compliant message was left.  CSW is currently awaiting a return call.  Nat Christen, BSW, MSW, [facility] [place], [address] [place], [place] [address] Di [email or web address] [phone]

## 2014-11-10 ENCOUNTER — Ambulatory Visit: Payer: Self-pay | Admitting: *Deleted

## 2014-11-10 DIAGNOSIS — J90 Pleural effusion, not elsewhere classified: Secondary | ICD-10-CM | POA: Diagnosis not present

## 2014-11-10 DIAGNOSIS — R600 Localized edema: Secondary | ICD-10-CM | POA: Diagnosis not present

## 2014-11-11 DIAGNOSIS — E876 Hypokalemia: Secondary | ICD-10-CM | POA: Diagnosis not present

## 2014-11-11 DIAGNOSIS — D509 Iron deficiency anemia, unspecified: Secondary | ICD-10-CM | POA: Diagnosis not present

## 2014-11-11 DIAGNOSIS — R600 Localized edema: Secondary | ICD-10-CM | POA: Diagnosis not present

## 2014-11-11 DIAGNOSIS — Z452 Encounter for adjustment and management of vascular access device: Secondary | ICD-10-CM | POA: Diagnosis not present

## 2014-11-11 DIAGNOSIS — D631 Anemia in chronic kidney disease: Secondary | ICD-10-CM | POA: Diagnosis not present

## 2014-11-11 DIAGNOSIS — D689 Coagulation defect, unspecified: Secondary | ICD-10-CM | POA: Diagnosis not present

## 2014-11-11 DIAGNOSIS — J90 Pleural effusion, not elsewhere classified: Secondary | ICD-10-CM | POA: Diagnosis not present

## 2014-11-11 DIAGNOSIS — N186 End stage renal disease: Secondary | ICD-10-CM | POA: Diagnosis not present

## 2014-11-12 DIAGNOSIS — R600 Localized edema: Secondary | ICD-10-CM | POA: Diagnosis not present

## 2014-11-12 DIAGNOSIS — N61 Inflammatory disorders of breast: Secondary | ICD-10-CM | POA: Diagnosis not present

## 2014-11-12 DIAGNOSIS — J90 Pleural effusion, not elsewhere classified: Secondary | ICD-10-CM | POA: Diagnosis not present

## 2014-11-13 ENCOUNTER — Other Ambulatory Visit: Payer: Self-pay | Admitting: *Deleted

## 2014-11-13 DIAGNOSIS — N186 End stage renal disease: Secondary | ICD-10-CM | POA: Diagnosis not present

## 2014-11-13 DIAGNOSIS — E876 Hypokalemia: Secondary | ICD-10-CM | POA: Diagnosis not present

## 2014-11-13 DIAGNOSIS — Z452 Encounter for adjustment and management of vascular access device: Secondary | ICD-10-CM | POA: Diagnosis not present

## 2014-11-13 DIAGNOSIS — D689 Coagulation defect, unspecified: Secondary | ICD-10-CM | POA: Diagnosis not present

## 2014-11-13 DIAGNOSIS — D509 Iron deficiency anemia, unspecified: Secondary | ICD-10-CM | POA: Diagnosis not present

## 2014-11-13 DIAGNOSIS — D631 Anemia in chronic kidney disease: Secondary | ICD-10-CM | POA: Diagnosis not present

## 2014-11-13 NOTE — Patient Outreach (Signed)
[facility]) Care Management  11/13/2014  ZAYLAH BLECHA 20-Sep-1955 [id number]   CSW made a second attempt to try and contact patient today to perform phone assessment, as well as assess and assist with social work needs and services; however, patient was not available at the time of CSW's call.  CSW left a HIPAA compliant message on voicemail and is currently awaiting a return call.  Nat Christen, BSW, MSW, [place] [place], [place], [place] [address] Di [email or web address] [phone]

## 2014-11-16 DIAGNOSIS — E876 Hypokalemia: Secondary | ICD-10-CM | POA: Diagnosis not present

## 2014-11-16 DIAGNOSIS — Z452 Encounter for adjustment and management of vascular access device: Secondary | ICD-10-CM | POA: Diagnosis not present

## 2014-11-16 DIAGNOSIS — N186 End stage renal disease: Secondary | ICD-10-CM | POA: Diagnosis not present

## 2014-11-16 DIAGNOSIS — D631 Anemia in chronic kidney disease: Secondary | ICD-10-CM | POA: Diagnosis not present

## 2014-11-16 DIAGNOSIS — D689 Coagulation defect, unspecified: Secondary | ICD-10-CM | POA: Diagnosis not present

## 2014-11-16 DIAGNOSIS — D509 Iron deficiency anemia, unspecified: Secondary | ICD-10-CM | POA: Diagnosis not present

## 2014-11-17 DIAGNOSIS — J9811 Atelectasis: Secondary | ICD-10-CM | POA: Diagnosis not present

## 2014-11-17 DIAGNOSIS — J984 Other disorders of lung: Secondary | ICD-10-CM | POA: Diagnosis not present

## 2014-11-17 DIAGNOSIS — J90 Pleural effusion, not elsewhere classified: Secondary | ICD-10-CM | POA: Diagnosis not present

## 2014-11-18 DIAGNOSIS — D689 Coagulation defect, unspecified: Secondary | ICD-10-CM | POA: Diagnosis not present

## 2014-11-18 DIAGNOSIS — D509 Iron deficiency anemia, unspecified: Secondary | ICD-10-CM | POA: Diagnosis not present

## 2014-11-18 DIAGNOSIS — D631 Anemia in chronic kidney disease: Secondary | ICD-10-CM | POA: Diagnosis not present

## 2014-11-18 DIAGNOSIS — E876 Hypokalemia: Secondary | ICD-10-CM | POA: Diagnosis not present

## 2014-11-18 DIAGNOSIS — N186 End stage renal disease: Secondary | ICD-10-CM | POA: Diagnosis not present

## 2014-11-18 DIAGNOSIS — Z452 Encounter for adjustment and management of vascular access device: Secondary | ICD-10-CM | POA: Diagnosis not present

## 2014-11-19 DIAGNOSIS — J90 Pleural effusion, not elsewhere classified: Secondary | ICD-10-CM | POA: Diagnosis not present

## 2014-11-19 DIAGNOSIS — R079 Chest pain, unspecified: Secondary | ICD-10-CM | POA: Diagnosis not present

## 2014-11-19 DIAGNOSIS — R109 Unspecified abdominal pain: Secondary | ICD-10-CM | POA: Diagnosis not present

## 2014-11-19 DIAGNOSIS — R600 Localized edema: Secondary | ICD-10-CM | POA: Diagnosis not present

## 2014-11-20 DIAGNOSIS — E876 Hypokalemia: Secondary | ICD-10-CM | POA: Diagnosis not present

## 2014-11-20 DIAGNOSIS — D689 Coagulation defect, unspecified: Secondary | ICD-10-CM | POA: Diagnosis not present

## 2014-11-20 DIAGNOSIS — N186 End stage renal disease: Secondary | ICD-10-CM | POA: Diagnosis not present

## 2014-11-20 DIAGNOSIS — D509 Iron deficiency anemia, unspecified: Secondary | ICD-10-CM | POA: Diagnosis not present

## 2014-11-20 DIAGNOSIS — Z452 Encounter for adjustment and management of vascular access device: Secondary | ICD-10-CM | POA: Diagnosis not present

## 2014-11-20 DIAGNOSIS — D631 Anemia in chronic kidney disease: Secondary | ICD-10-CM | POA: Diagnosis not present

## 2014-11-23 DIAGNOSIS — D509 Iron deficiency anemia, unspecified: Secondary | ICD-10-CM | POA: Diagnosis not present

## 2014-11-23 DIAGNOSIS — D631 Anemia in chronic kidney disease: Secondary | ICD-10-CM | POA: Diagnosis not present

## 2014-11-23 DIAGNOSIS — E876 Hypokalemia: Secondary | ICD-10-CM | POA: Diagnosis not present

## 2014-11-23 DIAGNOSIS — N186 End stage renal disease: Secondary | ICD-10-CM | POA: Diagnosis not present

## 2014-11-23 DIAGNOSIS — D689 Coagulation defect, unspecified: Secondary | ICD-10-CM | POA: Diagnosis not present

## 2014-11-23 DIAGNOSIS — Z452 Encounter for adjustment and management of vascular access device: Secondary | ICD-10-CM | POA: Diagnosis not present

## 2014-11-25 ENCOUNTER — Encounter: Payer: Self-pay | Admitting: Vascular Surgery

## 2014-11-25 DIAGNOSIS — E876 Hypokalemia: Secondary | ICD-10-CM | POA: Diagnosis not present

## 2014-11-25 DIAGNOSIS — D509 Iron deficiency anemia, unspecified: Secondary | ICD-10-CM | POA: Diagnosis not present

## 2014-11-25 DIAGNOSIS — N186 End stage renal disease: Secondary | ICD-10-CM | POA: Diagnosis not present

## 2014-11-25 DIAGNOSIS — Z452 Encounter for adjustment and management of vascular access device: Secondary | ICD-10-CM | POA: Diagnosis not present

## 2014-11-25 DIAGNOSIS — D631 Anemia in chronic kidney disease: Secondary | ICD-10-CM | POA: Diagnosis not present

## 2014-11-25 DIAGNOSIS — D689 Coagulation defect, unspecified: Secondary | ICD-10-CM | POA: Diagnosis not present

## 2014-11-26 DIAGNOSIS — J90 Pleural effusion, not elsewhere classified: Secondary | ICD-10-CM | POA: Diagnosis not present

## 2014-11-26 DIAGNOSIS — M329 Systemic lupus erythematosus, unspecified: Secondary | ICD-10-CM | POA: Diagnosis not present

## 2014-11-26 DIAGNOSIS — N186 End stage renal disease: Secondary | ICD-10-CM | POA: Diagnosis not present

## 2014-11-26 DIAGNOSIS — D631 Anemia in chronic kidney disease: Secondary | ICD-10-CM | POA: Diagnosis not present

## 2014-11-27 ENCOUNTER — Encounter: Payer: Self-pay | Admitting: Vascular Surgery

## 2014-11-27 DIAGNOSIS — D509 Iron deficiency anemia, unspecified: Secondary | ICD-10-CM | POA: Diagnosis not present

## 2014-11-27 DIAGNOSIS — Z452 Encounter for adjustment and management of vascular access device: Secondary | ICD-10-CM | POA: Diagnosis not present

## 2014-11-27 DIAGNOSIS — N186 End stage renal disease: Secondary | ICD-10-CM | POA: Diagnosis not present

## 2014-11-27 DIAGNOSIS — E876 Hypokalemia: Secondary | ICD-10-CM | POA: Diagnosis not present

## 2014-11-27 DIAGNOSIS — D631 Anemia in chronic kidney disease: Secondary | ICD-10-CM | POA: Diagnosis not present

## 2014-11-27 DIAGNOSIS — D689 Coagulation defect, unspecified: Secondary | ICD-10-CM | POA: Diagnosis not present

## 2014-11-30 ENCOUNTER — Other Ambulatory Visit: Payer: Self-pay | Admitting: *Deleted

## 2014-11-30 ENCOUNTER — Encounter: Payer: Self-pay | Admitting: *Deleted

## 2014-11-30 DIAGNOSIS — E876 Hypokalemia: Secondary | ICD-10-CM | POA: Diagnosis not present

## 2014-11-30 DIAGNOSIS — D509 Iron deficiency anemia, unspecified: Secondary | ICD-10-CM | POA: Diagnosis not present

## 2014-11-30 DIAGNOSIS — D689 Coagulation defect, unspecified: Secondary | ICD-10-CM | POA: Diagnosis not present

## 2014-11-30 DIAGNOSIS — N186 End stage renal disease: Secondary | ICD-10-CM | POA: Diagnosis not present

## 2014-11-30 DIAGNOSIS — Z452 Encounter for adjustment and management of vascular access device: Secondary | ICD-10-CM | POA: Diagnosis not present

## 2014-11-30 DIAGNOSIS — D631 Anemia in chronic kidney disease: Secondary | ICD-10-CM | POA: Diagnosis not present

## 2014-11-30 NOTE — Patient Outreach (Signed)
[facility]) Care Management  11/30/2014  [name] [name] 1956-05-23 [id number]   CSW made a third attempt to try and contact patient today to perform phone assessment, as well as assess and assist with social work needs and services, without success.  A third HIPAA compliant message was left for patient on voicemail. In CSW's message, CSW explained that CSW continues to try and contact patient to schedule a home visit to assist patient with completion of requested social work services and referrals to various community agencies and resources.  CSW encouraged patient to return CSW's call at her earliest convenience if patient is interested in receiving social work services through [place] with [facility]. CSW will mail an outreach letter to patient's home today, allowing patient 10 business days to return CSW's call if patient wishes to proceed with services. Otherwise, CSW will perform a case closure on patient, due to inability to establish initial phone contact with patient, despite three previous attempts.  Nat Christen, BSW, MSW, [facility] [place], [place], [place] [address] Di [email or web address] [phone]

## 2014-12-01 ENCOUNTER — Encounter: Payer: Self-pay | Admitting: Vascular Surgery

## 2014-12-01 ENCOUNTER — Ambulatory Visit (INDEPENDENT_AMBULATORY_CARE_PROVIDER_SITE_OTHER): Payer: Medicare Other | Admitting: Vascular Surgery

## 2014-12-01 ENCOUNTER — Other Ambulatory Visit: Payer: Self-pay

## 2014-12-01 VITALS — BP 118/86 | HR 111 | Resp 14 | Ht 61.0 in | Wt 87.0 lb

## 2014-12-01 DIAGNOSIS — N186 End stage renal disease: Secondary | ICD-10-CM

## 2014-12-01 NOTE — Progress Notes (Signed)
Subjective:     [name], female   DOB: September 15, 1955, 59 y.o.   MRN: [id number]  HPI this 59 year old female has end-stage renal disease as hemodialysis on Monday Wednesday and Friday. She currently has a catheter in her right IJ. She was evaluated today for vascular access. Kidney failure is likely due to systemic lupus erythematosus. She is right-handed. She has never had access in the past.  Past Medical History  Diagnosis Date  . Anemia, B12 deficiency   . History of acute pancreatitis   . Right knee pain     No recent imaging on chart  . 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 Dr. Mancel Bale, [facility]  . Hypertriglyceridemia   . GERD (gastroesophageal reflux disease)   . Subdural hematoma 02/2008    Likely 2/2 trauma from seizure from EtOH withdrawal, chronic in nature, sees Dr. Jerene Bears. Most recent CT head 10/2009 showing stable but persistent hematoma without mass effect.  . History of seizure disorder     Likely 2/2 alcohol abuse  . Hypocalcemia   . Hypomagnesemia   . Failure to thrive in childhood     Unclear etiology  . HTN (hypertension)   . Thrombocytopenia   . Hepatomegaly     On exam  . Joint pain   . Alcohol abuse   . Vitamin D deficiency   . Pancreatitis   . Insomnia   . Hyperlipidemia   . Pernicious anemia   . Macrocytic anemia   . 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 Depression   . Fx humeral neck 04/17/2011    Transverse fracture- minimally displaced- managed as outpatient   . ABNORMAL PAP SMEAR, LGSIL 07/23/2008    Annotation: HPV positive CIN I Dr. Mancel Bale, [facility] Qualifier: Diagnosis of  By: Oretha Ellis    . Pneumonia 05/20/2012  . Arthritis     "shoulders" (08/15/2013)  . 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  Marland Kitchen Chronic diastolic CHF (congestive heart failure)   . Hypomagnesemia   . Seizures      "don't know when/why I had them; daughter was always there w/me"  . On home oxygen therapy     "3L; mostly at night" (06/19/2014)  . Shortness of breath dyspnea     History  Substance Use Topics  . Smoking status: Former Smoker -- 0.50 packs/day for 40 years    Types: Cigarettes    Quit date: 09/20/2010  . Smokeless tobacco: Never Used  . Alcohol Use: 0.0 oz/week    0 Standard drinks or equivalent per week     Comment: 06/19/2014 "graduated from [place] [employer] (for alcohol abuse) in October 2015"     Family History  Problem Relation Age of Onset  . Cancer Mother     Died from stomach cancer and "flesh eating rash  . Heart failure Father     Died in 77s from an MI  . Alcohol abuse Sister     Twin sister drinks a lot, as did both her parents and brothers  . Stroke Brother     Has 7 brothers, 1 with CVA  . Lupus Mother     Allergies  Allergen Reactions  . Amitriptyline Hcl Swelling    In the face.  . Doxycycline Hyclate Itching    Feels like something crawling under  her skin     Current outpatient prescriptions:  .  albuterol (PROAIR HFA) 108 (90 BASE) MCG/ACT inhaler, Inhale 1-2 puffs into the lungs every 6 (six) hours as needed for wheezing or shortness of breath., Disp: 1 Inhaler, Rfl: 1 .  calcium carbonate (TUMS) 500 MG chewable tablet, Chew 6 tablets (1,200 mg of elemental calcium total) by mouth at bedtime., Disp: , Rfl:  .  camphor-menthol (SARNA) lotion, Apply topically as needed for itching., Disp: 222 mL, Rfl: 0 .  cetirizine (ZYRTEC) 10 MG tablet, Take 1 tablet (10 mg total) by mouth daily as needed for allergies., Disp: 30 tablet, Rfl: 6 .  CREON 12000 UNITS CPEP capsule, Take 12,000 Units by mouth 3 (three) times daily before meals. , Disp: , Rfl:  .  FLUoxetine (PROZAC) 10 MG capsule, Take 1 capsule (10 mg total) by mouth daily. For depression, Disp: 90 capsule, Rfl: 3 .  folic acid (FOLVITE) 1 MG tablet, Take 1 tablet (1 mg total) by mouth daily. For  folic acid replacement, Disp: 30 tablet, Rfl: 6 .  Lactobacillus (ACIDOPHILUS PROBIOTIC PO), Take 1 mg by mouth 3 (three) times daily. Take 2 (two) tablets by mouth three times daily, Disp: , Rfl:  .  lisinopril (PRINIVIL,ZESTRIL) 2.5 MG tablet, Take 2.5 mg by mouth daily., Disp: , Rfl:  .  loperamide (IMODIUM) 2 MG capsule, Take 1 capsule (2 mg total) by mouth as needed for diarrhea or loose stools., Disp: 30 capsule, Rfl: 0 .  mirtazapine (REMERON) 7.5 MG tablet, Take 7.5 mg by mouth at bedtime., Disp: , Rfl:  .  Multiple Vitamin (MULTIVITAMIN WITH MINERALS) TABS tablet, Take 1 tablet by mouth daily. For vitamin replacement, Disp: 90 tablet, Rfl: 4 .  Nutritional Supplements (FEEDING SUPPLEMENT, NEPRO CARB STEADY,) LIQD, Take 237 mLs by mouth 3 (three) times daily with meals., Disp: , Rfl: 0 .  Nutritional Supplements (FEEDING SUPPLEMENT, NEPRO CARB STEADY,) LIQD, Take 237 mLs by mouth as needed (missed meal during dialysis.)., Disp: , Rfl: 0 .  omeprazole (PRILOSEC) 40 MG capsule, Take 1 capsule (40 mg total) by mouth daily., Disp: 90 capsule, Rfl: 3 .  oxyCODONE (OXY IR/ROXICODONE) 5 MG immediate release tablet, Take 1 tablet (5 mg total) by mouth every 6 (six) hours as needed for moderate pain or severe pain., Disp: 20 tablet, Rfl: 0 .  ramelteon (ROZEREM) 8 MG tablet, Take 1 tablet (8 mg total) by mouth at bedtime., Disp: , Rfl:  .  thiamine (VITAMIN B-1) 100 MG tablet, Take 1 tablet (100 mg total) by mouth daily. For low thiamine, Disp: 90 tablet, Rfl: 3 .  vitamin B-12 (CYANOCOBALAMIN) 250 MCG tablet, Take 1 tablet (250 mcg total) by mouth every evening., Disp: 90 tablet, Rfl: 3 .  calcitRIOL (ROCALTROL) 0.25 MCG capsule, Take 0.25 mcg by mouth daily., Disp: , Rfl:  .  Darbepoetin Alfa (ARANESP) 200 MCG/0.4ML SOSY injection, Inject 0.4 mLs (200 mcg total) into the vein every Wednesday with hemodialysis. (Patient not taking: Reported on 12/01/2014), Disp: 1.68 mL, Rfl:  .  diclofenac sodium  (VOLTAREN) 1 % GEL, Apply 4 g topically daily as needed (pain)., Disp: , Rfl:  .  hydroxychloroquine (PLAQUENIL) 200 MG tablet, Take 2 tablets (400 mg total) by mouth daily. (Patient not taking: Reported on 12/01/2014), Disp: , Rfl:  .  lactobacillus acidophilus (BACID) TABS tablet, Take 2 tablets by mouth 3 (three) times daily. (Patient not taking: Reported on 12/01/2014), Disp: 90 tablet, Rfl: 0 .  predniSONE (DELTASONE) 5  MG tablet, Take 1 tablet (5 mg total) by mouth daily with breakfast. (Patient not taking: Reported on 12/01/2014), Disp: , Rfl:   Filed Vitals:   12/01/14 1506  BP: 118/86  Pulse: 111  Resp: 14  Height: 5\' 1"  (1.549 m)  Weight: 87 lb (39.463 kg)    Body mass index is 16.45 kg/(m^2).           Review of Systems patient has history of multiple medical problems including subdural hematoma, I'll call abuse, pancreatitis, gastroesophageal reflux disease, major depressive disorders.     Objective:   Physical Exam BP 118/86 mmHg  Pulse 111  Resp 14  Ht 5\' 1"  (1.549 m)  Wt 87 lb (39.463 kg)  BMI 16.45 kg/m2  General thin chronically ill-appearing female in no apparent distress alert and oriented 3 Lungs no rhonchi or wheezing Cardiovascular regular rhythm no murmurs Abdomen soft nontender Left upper extremity is very small and thin with some surface veins inadequate for fistula creation. 2+ brachial and 1-2+ radial pulse palpable. Vein mapping revealed no adequate veins for fistula creation I performed a bedside sinusitis exam the left upper extremity and the basilic vein is also too small for fistula creation-patient needs AV graft     Assessment:     End-stage renal disease patient currently on hemodialysis Monday Wednesday Friday needs access Not candidate for fistula because of small caliber veins    Plan:     Plan insertion left upper arm AV graft on Thursday, June 30. Potential for steal syndrome discussed with patient.

## 2014-12-02 ENCOUNTER — Encounter (HOSPITAL_COMMUNITY): Payer: Self-pay | Admitting: *Deleted

## 2014-12-02 DIAGNOSIS — E876 Hypokalemia: Secondary | ICD-10-CM | POA: Diagnosis not present

## 2014-12-02 DIAGNOSIS — D509 Iron deficiency anemia, unspecified: Secondary | ICD-10-CM | POA: Diagnosis not present

## 2014-12-02 DIAGNOSIS — Z452 Encounter for adjustment and management of vascular access device: Secondary | ICD-10-CM | POA: Diagnosis not present

## 2014-12-02 DIAGNOSIS — D689 Coagulation defect, unspecified: Secondary | ICD-10-CM | POA: Diagnosis not present

## 2014-12-02 DIAGNOSIS — N186 End stage renal disease: Secondary | ICD-10-CM | POA: Diagnosis not present

## 2014-12-02 DIAGNOSIS — D631 Anemia in chronic kidney disease: Secondary | ICD-10-CM | POA: Diagnosis not present

## 2014-12-02 MED ORDER — DEXTROSE 5 % IV SOLN
1.5000 g | INTRAVENOUS | Status: AC
  Administered 2014-12-03: 1.5 g via INTRAVENOUS
  Filled 2014-12-02: qty 1.5

## 2014-12-02 MED ORDER — SODIUM CHLORIDE 0.9 % IV SOLN
INTRAVENOUS | Status: DC
  Administered 2014-12-03 (×2): via INTRAVENOUS

## 2014-12-02 NOTE — Progress Notes (Signed)
Pt denies SOB, chest pain, and being under the care of a cardiologist. Pt consented for [name]  to write down pt instructions for DOS. Pt daughter stated, " we were told to arrive at admitting at 7:00 AM and to not take any medications until we arrive; we have to bring the medication in with [place]." Pt daughter, [name] [name] advised to follow MD instructions." [name] [name], also made aware to have her mother stop taking otc vitamins, herbal medications and NSAIDs. [name] [name] verbalized understanding of all pre-op instructions.

## 2014-12-03 ENCOUNTER — Ambulatory Visit (HOSPITAL_COMMUNITY): Payer: Medicare Other | Admitting: Anesthesiology

## 2014-12-03 ENCOUNTER — Encounter (HOSPITAL_COMMUNITY)
Admission: RE | Disposition: A | Discharge status: Home / Self Care | Payer: Self-pay | Source: Ambulatory Visit | Attending: Vascular Surgery

## 2014-12-03 ENCOUNTER — Ambulatory Visit (HOSPITAL_COMMUNITY)
Admission: RE | Admit: 2014-12-03 | Discharge: 2014-12-03 | Disposition: A | Discharge status: Home / Self Care | Payer: Medicare Other | Source: Ambulatory Visit | Attending: Vascular Surgery | Admitting: Vascular Surgery

## 2014-12-03 DIAGNOSIS — E559 Vitamin D deficiency, unspecified: Secondary | ICD-10-CM | POA: Diagnosis present

## 2014-12-03 DIAGNOSIS — D696 Thrombocytopenia, unspecified: Secondary | ICD-10-CM

## 2014-12-03 DIAGNOSIS — M13812 Other specified arthritis, left shoulder: Secondary | ICD-10-CM | POA: Insufficient documentation

## 2014-12-03 DIAGNOSIS — N031 Chronic nephritic syndrome with focal and segmental glomerular lesions: Secondary | ICD-10-CM | POA: Diagnosis not present

## 2014-12-03 DIAGNOSIS — E43 Unspecified severe protein-calorie malnutrition: Secondary | ICD-10-CM | POA: Diagnosis not present

## 2014-12-03 DIAGNOSIS — N186 End stage renal disease: Secondary | ICD-10-CM | POA: Diagnosis present

## 2014-12-03 DIAGNOSIS — M13811 Other specified arthritis, right shoulder: Secondary | ICD-10-CM | POA: Diagnosis present

## 2014-12-03 DIAGNOSIS — Z791 Long term (current) use of non-steroidal anti-inflammatories (NSAID): Secondary | ICD-10-CM | POA: Insufficient documentation

## 2014-12-03 DIAGNOSIS — D539 Nutritional anemia, unspecified: Secondary | ICD-10-CM | POA: Insufficient documentation

## 2014-12-03 DIAGNOSIS — Z8611 Personal history of tuberculosis: Secondary | ICD-10-CM

## 2014-12-03 DIAGNOSIS — Z681 Body mass index (BMI) 19 or less, adult: Secondary | ICD-10-CM

## 2014-12-03 DIAGNOSIS — Z992 Dependence on renal dialysis: Secondary | ICD-10-CM

## 2014-12-03 DIAGNOSIS — J90 Pleural effusion, not elsewhere classified: Secondary | ICD-10-CM | POA: Diagnosis not present

## 2014-12-03 DIAGNOSIS — G4700 Insomnia, unspecified: Secondary | ICD-10-CM

## 2014-12-03 DIAGNOSIS — Z79891 Long term (current) use of opiate analgesic: Secondary | ICD-10-CM | POA: Insufficient documentation

## 2014-12-03 DIAGNOSIS — M6281 Muscle weakness (generalized): Secondary | ICD-10-CM | POA: Diagnosis not present

## 2014-12-03 DIAGNOSIS — M329 Systemic lupus erythematosus, unspecified: Secondary | ICD-10-CM

## 2014-12-03 DIAGNOSIS — F1010 Alcohol abuse, uncomplicated: Secondary | ICD-10-CM | POA: Diagnosis present

## 2014-12-03 DIAGNOSIS — I5042 Chronic combined systolic (congestive) and diastolic (congestive) heart failure: Secondary | ICD-10-CM | POA: Diagnosis present

## 2014-12-03 DIAGNOSIS — G40909 Epilepsy, unspecified, not intractable, without status epilepticus: Secondary | ICD-10-CM | POA: Diagnosis present

## 2014-12-03 DIAGNOSIS — Z9049 Acquired absence of other specified parts of digestive tract: Secondary | ICD-10-CM | POA: Diagnosis present

## 2014-12-03 DIAGNOSIS — Z9981 Dependence on supplemental oxygen: Secondary | ICD-10-CM | POA: Insufficient documentation

## 2014-12-03 DIAGNOSIS — J449 Chronic obstructive pulmonary disease, unspecified: Secondary | ICD-10-CM | POA: Diagnosis present

## 2014-12-03 DIAGNOSIS — T827XXA Infection and inflammatory reaction due to other cardiac and vascular devices, implants and grafts, initial encounter: Secondary | ICD-10-CM | POA: Diagnosis not present

## 2014-12-03 DIAGNOSIS — E876 Hypokalemia: Secondary | ICD-10-CM | POA: Diagnosis present

## 2014-12-03 DIAGNOSIS — F1290 Cannabis use, unspecified, uncomplicated: Secondary | ICD-10-CM | POA: Diagnosis not present

## 2014-12-03 DIAGNOSIS — D509 Iron deficiency anemia, unspecified: Secondary | ICD-10-CM | POA: Diagnosis not present

## 2014-12-03 DIAGNOSIS — K219 Gastro-esophageal reflux disease without esophagitis: Secondary | ICD-10-CM | POA: Insufficient documentation

## 2014-12-03 DIAGNOSIS — D688 Other specified coagulation defects: Secondary | ICD-10-CM | POA: Diagnosis not present

## 2014-12-03 DIAGNOSIS — M3214 Glomerular disease in systemic lupus erythematosus: Secondary | ICD-10-CM | POA: Diagnosis not present

## 2014-12-03 DIAGNOSIS — D510 Vitamin B12 deficiency anemia due to intrinsic factor deficiency: Secondary | ICD-10-CM | POA: Insufficient documentation

## 2014-12-03 DIAGNOSIS — Y832 Surgical operation with anastomosis, bypass or graft as the cause of abnormal reaction of the patient, or of later complication, without mention of misadventure at the time of the procedure: Secondary | ICD-10-CM | POA: Diagnosis present

## 2014-12-03 DIAGNOSIS — F329 Major depressive disorder, single episode, unspecified: Secondary | ICD-10-CM

## 2014-12-03 DIAGNOSIS — I120 Hypertensive chronic kidney disease with stage 5 chronic kidney disease or end stage renal disease: Secondary | ICD-10-CM | POA: Diagnosis not present

## 2014-12-03 DIAGNOSIS — Z888 Allergy status to other drugs, medicaments and biological substances status: Secondary | ICD-10-CM | POA: Diagnosis not present

## 2014-12-03 DIAGNOSIS — I10 Essential (primary) hypertension: Secondary | ICD-10-CM | POA: Diagnosis not present

## 2014-12-03 DIAGNOSIS — Z881 Allergy status to other antibiotic agents status: Secondary | ICD-10-CM | POA: Diagnosis not present

## 2014-12-03 DIAGNOSIS — D631 Anemia in chronic kidney disease: Secondary | ICD-10-CM | POA: Diagnosis not present

## 2014-12-03 DIAGNOSIS — E8770 Fluid overload, unspecified: Secondary | ICD-10-CM | POA: Diagnosis not present

## 2014-12-03 DIAGNOSIS — I959 Hypotension, unspecified: Secondary | ICD-10-CM | POA: Diagnosis present

## 2014-12-03 DIAGNOSIS — Z87891 Personal history of nicotine dependence: Secondary | ICD-10-CM

## 2014-12-03 DIAGNOSIS — M7989 Other specified soft tissue disorders: Secondary | ICD-10-CM | POA: Diagnosis not present

## 2014-12-03 DIAGNOSIS — Z79899 Other long term (current) drug therapy: Secondary | ICD-10-CM

## 2014-12-03 DIAGNOSIS — T8249XD Other complication of vascular dialysis catheter, subsequent encounter: Secondary | ICD-10-CM | POA: Diagnosis not present

## 2014-12-03 DIAGNOSIS — I5032 Chronic diastolic (congestive) heart failure: Secondary | ICD-10-CM | POA: Insufficient documentation

## 2014-12-03 DIAGNOSIS — F1490 Cocaine use, unspecified, uncomplicated: Secondary | ICD-10-CM | POA: Diagnosis not present

## 2014-12-03 DIAGNOSIS — M79622 Pain in left upper arm: Secondary | ICD-10-CM | POA: Diagnosis not present

## 2014-12-03 DIAGNOSIS — E781 Pure hyperglyceridemia: Secondary | ICD-10-CM | POA: Diagnosis present

## 2014-12-03 DIAGNOSIS — D519 Vitamin B12 deficiency anemia, unspecified: Secondary | ICD-10-CM | POA: Diagnosis not present

## 2014-12-03 DIAGNOSIS — R262 Difficulty in walking, not elsewhere classified: Secondary | ICD-10-CM | POA: Diagnosis not present

## 2014-12-03 HISTORY — PX: AV FISTULA PLACEMENT: SHX1204

## 2014-12-03 HISTORY — DX: Pleural effusion, not elsewhere classified: J90

## 2014-12-03 LAB — POCT I-STAT 4, (NA,K, GLUC, HGB,HCT)
GLUCOSE: 86 mg/dL (ref 65–99)
HCT: 35 % — ABNORMAL LOW (ref 36.0–46.0)
Hemoglobin: 11.9 g/dL — ABNORMAL LOW (ref 12.0–15.0)
POTASSIUM: 3.2 mmol/L — AB (ref 3.5–5.1)
Sodium: 140 mmol/L (ref 135–145)

## 2014-12-03 SURGERY — INSERTION OF ARTERIOVENOUS (AV) GORE-TEX GRAFT ARM
Anesthesia: General | Site: Arm Upper | Laterality: Left

## 2014-12-03 MED ORDER — FENTANYL CITRATE (PF) 100 MCG/2ML IJ SOLN
INTRAMUSCULAR | Status: DC | PRN
  Administered 2014-12-03 (×2): 50 ug via INTRAVENOUS

## 2014-12-03 MED ORDER — FENTANYL CITRATE (PF) 100 MCG/2ML IJ SOLN
25.0000 ug | INTRAMUSCULAR | Status: DC | PRN
  Administered 2014-12-03 (×2): 25 ug via INTRAVENOUS

## 2014-12-03 MED ORDER — DIPHENHYDRAMINE HCL 50 MG/ML IJ SOLN
INTRAMUSCULAR | Status: AC
  Filled 2014-12-03: qty 1

## 2014-12-03 MED ORDER — DIPHENHYDRAMINE HCL 50 MG/ML IJ SOLN
12.5000 mg | Freq: Once | INTRAMUSCULAR | Status: AC
  Administered 2014-12-03: 12.5 mg via INTRAVENOUS

## 2014-12-03 MED ORDER — OXYCODONE HCL 5 MG/5ML PO SOLN: 5.0000 mg | Freq: Once | ORAL | Status: AC | PRN

## 2014-12-03 MED ORDER — OXYCODONE HCL 5 MG PO TABS
ORAL_TABLET | ORAL | Status: AC
  Filled 2014-12-03: qty 1

## 2014-12-03 MED ORDER — SODIUM CHLORIDE 0.9 % IR SOLN
Status: DC | PRN
  Administered 2014-12-03: 500 mL

## 2014-12-03 MED ORDER — FENTANYL CITRATE (PF) 250 MCG/5ML IJ SOLN
INTRAMUSCULAR | Status: AC
  Filled 2014-12-03: qty 5

## 2014-12-03 MED ORDER — PHENYLEPHRINE HCL 10 MG/ML IJ SOLN
10.0000 mg | INTRAVENOUS | Status: DC | PRN
  Administered 2014-12-03: 20 ug/min via INTRAVENOUS

## 2014-12-03 MED ORDER — LIDOCAINE HCL (CARDIAC) 20 MG/ML IV SOLN
INTRAVENOUS | Status: AC
  Filled 2014-12-03: qty 5

## 2014-12-03 MED ORDER — OXYCODONE HCL 5 MG PO TABS
5.0000 mg | ORAL_TABLET | Freq: Once | ORAL | Status: AC | PRN
  Administered 2014-12-03: 5 mg via ORAL

## 2014-12-03 MED ORDER — CHLORHEXIDINE GLUCONATE CLOTH 2 % EX PADS: 6.0000 | MEDICATED_PAD | Freq: Once | CUTANEOUS | Status: DC

## 2014-12-03 MED ORDER — ONDANSETRON HCL 4 MG/2ML IJ SOLN
INTRAMUSCULAR | Status: DC | PRN
  Administered 2014-12-03: 4 mg via INTRAVENOUS

## 2014-12-03 MED ORDER — 0.9 % SODIUM CHLORIDE (POUR BTL) OPTIME
TOPICAL | Status: DC | PRN
  Administered 2014-12-03: 1000 mL

## 2014-12-03 MED ORDER — OXYCODONE HCL 5 MG PO TABS: 5.0000 mg | ORAL_TABLET | Freq: Four times a day (QID) | ORAL | Status: DC | PRN

## 2014-12-03 MED ORDER — FENTANYL CITRATE (PF) 100 MCG/2ML IJ SOLN
INTRAMUSCULAR | Status: AC
  Filled 2014-12-03: qty 2

## 2014-12-03 MED ORDER — PROPOFOL 10 MG/ML IV BOLUS
INTRAVENOUS | Status: DC | PRN
  Administered 2014-12-03: 100 mg via INTRAVENOUS

## 2014-12-03 MED ORDER — HEPARIN SODIUM (PORCINE) 1000 UNIT/ML IJ SOLN
2.2000 mL | Freq: Once | INTRAMUSCULAR | Status: AC
  Administered 2014-12-03: 1000 [IU] via INTRAVENOUS
  Filled 2014-12-03: qty 2.2

## 2014-12-03 MED ORDER — LIDOCAINE HCL (CARDIAC) 20 MG/ML IV SOLN
INTRAVENOUS | Status: DC | PRN
  Administered 2014-12-03: 60 mg via INTRAVENOUS

## 2014-12-03 SURGICAL SUPPLY — 26 items
ARMBAND PINK RESTRICT EXTREMIT (MISCELLANEOUS) ×3 IMPLANT
CANISTER SUCTION 2500CC (MISCELLANEOUS) ×3 IMPLANT
CLIP TI MEDIUM 6 (CLIP) ×3 IMPLANT
CLIP TI WIDE RED SMALL 6 (CLIP) ×3 IMPLANT
DECANTER SPIKE VIAL GLASS SM (MISCELLANEOUS) ×3 IMPLANT
ELECT REM PT RETURN 9FT ADLT (ELECTROSURGICAL) ×3
ELECTRODE REM PT RTRN 9FT ADLT (ELECTROSURGICAL) ×1 IMPLANT
GEL ULTRASOUND 20GR AQUASONIC (MISCELLANEOUS) IMPLANT
GLOVE SS BIOGEL STRL SZ 7 (GLOVE) ×1 IMPLANT
GLOVE SUPERSENSE BIOGEL SZ 7 (GLOVE) ×2
GOWN STRL REUS W/ TWL LRG LVL3 (GOWN DISPOSABLE) ×3 IMPLANT
GOWN STRL REUS W/TWL LRG LVL3 (GOWN DISPOSABLE) ×9
GRAFT GORETEX STND 4X7 (Vascular Products) ×3 IMPLANT
GRAFT GORETEXSTD 4X7 (Vascular Products) IMPLANT
KIT BASIN OR (CUSTOM PROCEDURE TRAY) ×3 IMPLANT
KIT ROOM TURNOVER OR (KITS) ×3 IMPLANT
LIQUID BAND (GAUZE/BANDAGES/DRESSINGS) ×3 IMPLANT
NS IRRIG 1000ML POUR BTL (IV SOLUTION) ×3 IMPLANT
PACK CV ACCESS (CUSTOM PROCEDURE TRAY) ×3 IMPLANT
PAD ARMBOARD 7.5X6 YLW CONV (MISCELLANEOUS) ×6 IMPLANT
SUT PROLENE 6 0 BV (SUTURE) ×6 IMPLANT
SUT SILK 2 0 FS (SUTURE) ×2 IMPLANT
SUT VIC AB 3-0 SH 27 (SUTURE) ×6
SUT VIC AB 3-0 SH 27X BRD (SUTURE) ×2 IMPLANT
UNDERPAD 30X30 INCONTINENT (UNDERPADS AND DIAPERS) ×3 IMPLANT
WATER STERILE IRR 1000ML POUR (IV SOLUTION) ×3 IMPLANT

## 2014-12-03 NOTE — Interval H&P Note (Signed)
History and Physical Interval Note:  12/03/2014 9:36 AM  [name] [name]  has presented today for surgery, with the diagnosis of End Stage Renal Disease N18.6  The various methods of treatment have been discussed with the patient and family. After consideration of risks, benefits and other options for treatment, the patient has consented to  Procedure(s): INSERTION OF ARTERIOVENOUS (AV) GORE-TEX GRAFT ARM (Left) as a surgical intervention .  The patient's history has been reviewed, patient examined, no change in status, stable for surgery.  I have reviewed the patient's chart and labs.  Questions were answered to the patient's satisfaction.     Tinnie Gens

## 2014-12-03 NOTE — Progress Notes (Signed)
Spoke with [place] PA of [facility] regarding Potassium of 3.2. No new orders to treat, will receive dialysis Saturday.

## 2014-12-03 NOTE — Discharge Instructions (Signed)
° ° °  12/03/2014 [name] [name] [id number] August 14, 1955  Surgeon(s): Mal Misty, MD  Procedure(s): INSERTION OF ARTERIOVENOUS (AV) GORE-TEX GRAFT ARM Left upper arm loop AV graft  x Do not stick graft for 4 weeks

## 2014-12-03 NOTE — Transfer of Care (Signed)
Immediate Anesthesia Transfer of Care Note  Patient: [name] [name]  Procedure(s) Performed: Procedure(s): INSERTION OF ARTERIOVENOUS (AV) GORE-TEX GRAFT ARM (Left)  Patient Location: PACU  Anesthesia Type:General  Level of Consciousness: awake, alert , oriented and patient cooperative  Airway & Oxygen Therapy: Patient Spontanous Breathing and Patient connected to face mask oxygen  Post-op Assessment: Report given to RN, Post -op Vital signs reviewed and stable and Patient moving all extremities  Post vital signs: Reviewed and stable  Last Vitals:  Filed Vitals:   12/03/14 0712  BP: 119/86  Pulse: 70  Temp: 36.6 C  Resp: 14    Complications: No apparent anesthesia complications

## 2014-12-03 NOTE — Op Note (Signed)
OPERATIVE REPORT  Date of Surgery: 12/03/2014  Surgeon: Tinnie Gens, MD  Assistant: Leontine Locket PA  Pre-op Diagnosis: End Stage Renal Disease N18.6  Post-op Diagnosis: same  Procedure: Procedure(s): INSERTION OF ARTERIOVENOUS (AV) GORE-TEX GRAFT -left upper arm-brachial artery to brachial vein loop using 4 x 7 stretch Gore-Tex graft  Anesthesia: LMA  EBL: Minimal  Complications: None  Procedure Details: The patient was taken the operating room placed in supine position at which time satisfactory general-LMA anesthesia was measured. The left upper extremity was prepped with Betadine scrub and solution draped in routine sterile manner. The patient had a very small upper extremity quite thin but did have a palpable radial and ulnar pulse distally. No options were available for AV fistula creation. A short incision was made in the proximal upper arm near the axilla carried down through subcutaneous tissue and the brachial artery and veins were dissected free at this point. Artery had an excellent pulse. There were paired veins both of which were 4-1/2-5 mm in size. Following this a short incision was made in the distal upper arm brachial artery was exposed. It was quite small at this point probably only 2-1/2 mm. It was not felt satisfactory for inflow for the graft therefore is decided to do an upper arm loop graft. A 4 x 7 mm stretch Gore-Tex graft was delivered through a loop which was created with a curved tunneler. No heparin was given. Brachial artery was occluded proximally and distally just distal to the axilla opened 15 blade and extended with Potts scissors. 4 mm and the graft was slightly spatulated and anastomosed end-to-side with 60 proline. Following this the vein was ligated distally over the 15 blade extended with the Potts scissors. Except for a half millimeter dilator. The graft was spatulated and anastomosed end-to-side with 60 proline. Vessel loops were all released and there  was no excellent pulse and thrill in the graft. There was audible Doppler flow in the radial and ulnar artery at the wrist which did improve with compression of the graft. Adequate hemostasis was achieved and the wounds were closed in layers with Vicryls subcuticular fashion with Dermabond patient taken to recovery room in satisfactory condition   Tinnie Gens, MD 12/03/2014 11:30 AM

## 2014-12-03 NOTE — Anesthesia Preprocedure Evaluation (Addendum)
Anesthesia Evaluation  Patient identified by MRN, date of birth, ID band Patient awake    Reviewed: Allergy & Precautions, NPO status , Patient's Chart, lab work & pertinent test results  History of Anesthesia Complications Negative for: history of anesthetic complications  Airway Mallampati: II  TM Distance: >3 FB Neck ROM: Full    Dental  (+) Partial Upper   Pulmonary shortness of breath,  COPD inhaler, former smoker,  breath sounds clear to auscultation        Cardiovascular hypertension, Pt. on medications - angina+CHF - Past MI Rhythm:Regular     Neuro/Psych PSYCHIATRIC DISORDERS Depression negative neurological ROS     GI/Hepatic Neg liver ROS, GERD-  Controlled,  Endo/Other    Renal/GU ESRF and DialysisRenal disease     Musculoskeletal negative musculoskeletal ROS (+)   Abdominal   Peds  Hematology  (+) anemia ,   Anesthesia Other Findings   Reproductive/Obstetrics                            Anesthesia Physical Anesthesia Plan  ASA: III  Anesthesia Plan: General   Post-op Pain Management:    Induction: Intravenous  Airway Management Planned: LMA  Additional Equipment: None  Intra-op Plan:   Post-operative Plan: Extubation in OR  Informed Consent: I have reviewed the patients History and Physical, chart, labs and discussed the procedure including the risks, benefits and alternatives for the proposed anesthesia with the patient or authorized representative who has indicated his/her understanding and acceptance.   Dental advisory given  Plan Discussed with: CRNA and Surgeon  Anesthesia Plan Comments:         Anesthesia Quick Evaluation

## 2014-12-03 NOTE — H&P (View-Only) (Signed)
Subjective:     [name], female   DOB: 1955-12-09, 59 y.o.   MRN: [id number]  HPI this 59 year old female has end-stage renal disease as hemodialysis on Monday Wednesday and Friday. She currently has a catheter in her right IJ. She was evaluated today for vascular access. Kidney failure is likely due to systemic lupus erythematosus. She is right-handed. She has never had access in the past.  Past Medical History  Diagnosis Date  . Anemia, B12 deficiency   . History of acute pancreatitis   . Right knee pain     No recent imaging on chart  . 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 Dr. Mancel Bale, [facility]  . Hypertriglyceridemia   . GERD (gastroesophageal reflux disease)   . Subdural hematoma 02/2008    Likely 2/2 trauma from seizure from EtOH withdrawal, chronic in nature, sees Dr. Jerene Bears. Most recent CT head 10/2009 showing stable but persistent hematoma without mass effect.  . History of seizure disorder     Likely 2/2 alcohol abuse  . Hypocalcemia   . Hypomagnesemia   . Failure to thrive in childhood     Unclear etiology  . HTN (hypertension)   . Thrombocytopenia   . Hepatomegaly     On exam  . Joint pain   . Alcohol abuse   . Vitamin D deficiency   . Pancreatitis   . Insomnia   . Hyperlipidemia   . Pernicious anemia   . Macrocytic anemia   . 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 Depression   . Fx humeral neck 04/17/2011    Transverse fracture- minimally displaced- managed as outpatient   . ABNORMAL PAP SMEAR, LGSIL 07/23/2008    Annotation: HPV positive CIN I Dr. Mancel Bale, [facility] Qualifier: Diagnosis of  By: Oretha Ellis    . Pneumonia 05/20/2012  . Arthritis     "shoulders" (08/15/2013)  . 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  Marland Kitchen Chronic diastolic CHF (congestive heart failure)   . Hypomagnesemia   . Seizures      "don't know when/why I had them; daughter was always there w/me"  . On home oxygen therapy     "3L; mostly at night" (06/19/2014)  . Shortness of breath dyspnea     History  Substance Use Topics  . Smoking status: Former Smoker -- 0.50 packs/day for 40 years    Types: Cigarettes    Quit date: 09/20/2010  . Smokeless tobacco: Never Used  . Alcohol Use: 0.0 oz/week    0 Standard drinks or equivalent per week     Comment: 06/19/2014 "graduated from [employer] (for alcohol abuse) in October 2015"     Family History  Problem Relation Age of Onset  . Cancer Mother     Died from stomach cancer and "flesh eating rash  . Heart failure Father     Died in 67s from an MI  . Alcohol abuse Sister     Twin sister drinks a lot, as did both her parents and brothers  . Stroke Brother     Has 7 brothers, 1 with CVA  . Lupus Mother     Allergies  Allergen Reactions  . Amitriptyline Hcl Swelling    In the face.  . Doxycycline Hyclate Itching    Feels like something crawling under  her skin     Current outpatient prescriptions:  .  albuterol (PROAIR HFA) 108 (90 BASE) MCG/ACT inhaler, Inhale 1-2 puffs into the lungs every 6 (six) hours as needed for wheezing or shortness of breath., Disp: 1 Inhaler, Rfl: 1 .  calcium carbonate (TUMS) 500 MG chewable tablet, Chew 6 tablets (1,200 mg of elemental calcium total) by mouth at bedtime., Disp: , Rfl:  .  camphor-menthol (SARNA) lotion, Apply topically as needed for itching., Disp: 222 mL, Rfl: 0 .  cetirizine (ZYRTEC) 10 MG tablet, Take 1 tablet (10 mg total) by mouth daily as needed for allergies., Disp: 30 tablet, Rfl: 6 .  CREON 12000 UNITS CPEP capsule, Take 12,000 Units by mouth 3 (three) times daily before meals. , Disp: , Rfl:  .  FLUoxetine (PROZAC) 10 MG capsule, Take 1 capsule (10 mg total) by mouth daily. For depression, Disp: 90 capsule, Rfl: 3 .  folic acid (FOLVITE) 1 MG tablet, Take 1 tablet (1 mg total) by mouth daily. For  folic acid replacement, Disp: 30 tablet, Rfl: 6 .  Lactobacillus (ACIDOPHILUS PROBIOTIC PO), Take 1 mg by mouth 3 (three) times daily. Take 2 (two) tablets by mouth three times daily, Disp: , Rfl:  .  lisinopril (PRINIVIL,ZESTRIL) 2.5 MG tablet, Take 2.5 mg by mouth daily., Disp: , Rfl:  .  loperamide (IMODIUM) 2 MG capsule, Take 1 capsule (2 mg total) by mouth as needed for diarrhea or loose stools., Disp: 30 capsule, Rfl: 0 .  mirtazapine (REMERON) 7.5 MG tablet, Take 7.5 mg by mouth at bedtime., Disp: , Rfl:  .  Multiple Vitamin (MULTIVITAMIN WITH MINERALS) TABS tablet, Take 1 tablet by mouth daily. For vitamin replacement, Disp: 90 tablet, Rfl: 4 .  Nutritional Supplements (FEEDING SUPPLEMENT, NEPRO CARB STEADY,) LIQD, Take 237 mLs by mouth 3 (three) times daily with meals., Disp: , Rfl: 0 .  Nutritional Supplements (FEEDING SUPPLEMENT, NEPRO CARB STEADY,) LIQD, Take 237 mLs by mouth as needed (missed meal during dialysis.)., Disp: , Rfl: 0 .  omeprazole (PRILOSEC) 40 MG capsule, Take 1 capsule (40 mg total) by mouth daily., Disp: 90 capsule, Rfl: 3 .  oxyCODONE (OXY IR/ROXICODONE) 5 MG immediate release tablet, Take 1 tablet (5 mg total) by mouth every 6 (six) hours as needed for moderate pain or severe pain., Disp: 20 tablet, Rfl: 0 .  ramelteon (ROZEREM) 8 MG tablet, Take 1 tablet (8 mg total) by mouth at bedtime., Disp: , Rfl:  .  thiamine (VITAMIN B-1) 100 MG tablet, Take 1 tablet (100 mg total) by mouth daily. For low thiamine, Disp: 90 tablet, Rfl: 3 .  vitamin B-12 (CYANOCOBALAMIN) 250 MCG tablet, Take 1 tablet (250 mcg total) by mouth every evening., Disp: 90 tablet, Rfl: 3 .  calcitRIOL (ROCALTROL) 0.25 MCG capsule, Take 0.25 mcg by mouth daily., Disp: , Rfl:  .  Darbepoetin Alfa (ARANESP) 200 MCG/0.4ML SOSY injection, Inject 0.4 mLs (200 mcg total) into the vein every Wednesday with hemodialysis. (Patient not taking: Reported on 12/01/2014), Disp: 1.68 mL, Rfl:  .  diclofenac sodium  (VOLTAREN) 1 % GEL, Apply 4 g topically daily as needed (pain)., Disp: , Rfl:  .  hydroxychloroquine (PLAQUENIL) 200 MG tablet, Take 2 tablets (400 mg total) by mouth daily. (Patient not taking: Reported on 12/01/2014), Disp: , Rfl:  .  lactobacillus acidophilus (BACID) TABS tablet, Take 2 tablets by mouth 3 (three) times daily. (Patient not taking: Reported on 12/01/2014), Disp: 90 tablet, Rfl: 0 .  predniSONE (DELTASONE) 5  MG tablet, Take 1 tablet (5 mg total) by mouth daily with breakfast. (Patient not taking: Reported on 12/01/2014), Disp: , Rfl:   Filed Vitals:   12/01/14 1506  BP: 118/86  Pulse: 111  Resp: 14  Height: 5\' 1"  (1.549 m)  Weight: 87 lb (39.463 kg)    Body mass index is 16.45 kg/(m^2).           Review of Systems patient has history of multiple medical problems including subdural hematoma, I'll call abuse, pancreatitis, gastroesophageal reflux disease, major depressive disorders.     Objective:   Physical Exam BP 118/86 mmHg  Pulse 111  Resp 14  Ht 5\' 1"  (1.549 m)  Wt 87 lb (39.463 kg)  BMI 16.45 kg/m2  General thin chronically ill-appearing female in no apparent distress alert and oriented 3 Lungs no rhonchi or wheezing Cardiovascular regular rhythm no murmurs Abdomen soft nontender Left upper extremity is very small and thin with some surface veins inadequate for fistula creation. 2+ brachial and 1-2+ radial pulse palpable. Vein mapping revealed no adequate veins for fistula creation I performed a bedside sinusitis exam the left upper extremity and the basilic vein is also too small for fistula creation-patient needs AV graft     Assessment:     End-stage renal disease patient currently on hemodialysis Monday Wednesday Friday needs access Not candidate for fistula because of small caliber veins    Plan:     Plan insertion left upper arm AV graft on Thursday, June 30. Potential for steal syndrome discussed with patient.

## 2014-12-03 NOTE — Progress Notes (Signed)
Ok given by Dr Oletta Lamas to use HD cath for labs and IV.  (Lab was unable to access blood in arm)

## 2014-12-03 NOTE — Anesthesia Procedure Notes (Signed)
Procedure Name: LMA Insertion Date/Time: 12/03/2014 10:12 AM Performed by: Izora Gala Pre-anesthesia Checklist: Patient identified, Emergency Drugs available, Suction available and Patient being monitored Patient Re-evaluated:Patient Re-evaluated prior to inductionOxygen Delivery Method: Circle system utilized Preoxygenation: Pre-oxygenation with 100% oxygen Intubation Type: IV induction Ventilation: Mask ventilation without difficulty LMA: LMA inserted LMA Size: 3.0 Number of attempts: 1 Placement Confirmation: positive ETCO2

## 2014-12-04 ENCOUNTER — Encounter (HOSPITAL_COMMUNITY): Payer: Self-pay | Admitting: Vascular Surgery

## 2014-12-04 NOTE — Anesthesia Postprocedure Evaluation (Signed)
  Anesthesia Post-op Note  Patient: [name] [name]  Procedure(s) Performed: Procedure(s): INSERTION OF ARTERIOVENOUS (AV) GORE-TEX GRAFT ARM (Left)  Patient Location: PACU  Anesthesia Type:General  Level of Consciousness: awake  Airway and Oxygen Therapy: Patient Spontanous Breathing and Patient connected to nasal cannula oxygen  Post-op Pain: mild  Post-op Assessment: Post-op Vital signs reviewed, Patient's Cardiovascular Status Stable, Respiratory Function Stable, Patent Airway, No signs of Nausea or vomiting and Pain level controlled              Post-op Vital Signs: Reviewed and stable  Last Vitals:  Filed Vitals:   12/03/14 1300  BP: 93/72  Pulse:   Temp: 36.7 C  Resp: 20    Complications: No apparent anesthesia complications

## 2014-12-05 DIAGNOSIS — D688 Other specified coagulation defects: Secondary | ICD-10-CM | POA: Diagnosis not present

## 2014-12-05 DIAGNOSIS — E8770 Fluid overload, unspecified: Secondary | ICD-10-CM | POA: Diagnosis not present

## 2014-12-05 DIAGNOSIS — D631 Anemia in chronic kidney disease: Secondary | ICD-10-CM | POA: Diagnosis not present

## 2014-12-05 DIAGNOSIS — T8249XD Other complication of vascular dialysis catheter, subsequent encounter: Secondary | ICD-10-CM | POA: Diagnosis not present

## 2014-12-05 DIAGNOSIS — N186 End stage renal disease: Secondary | ICD-10-CM | POA: Diagnosis not present

## 2014-12-05 DIAGNOSIS — D509 Iron deficiency anemia, unspecified: Secondary | ICD-10-CM | POA: Diagnosis not present

## 2014-12-05 DIAGNOSIS — E876 Hypokalemia: Secondary | ICD-10-CM | POA: Diagnosis not present

## 2014-12-06 ENCOUNTER — Encounter (HOSPITAL_COMMUNITY): Payer: Self-pay | Admitting: Adult Health

## 2014-12-06 ENCOUNTER — Inpatient Hospital Stay (HOSPITAL_COMMUNITY)
Admission: EM | Admit: 2014-12-06 | Discharge: 2014-12-09 | DRG: 264 | Disposition: A | Discharge status: Home / Self Care | Payer: Medicare Other | Attending: Internal Medicine | Admitting: Internal Medicine

## 2014-12-06 ENCOUNTER — Emergency Department (HOSPITAL_COMMUNITY): Payer: Medicare Other

## 2014-12-06 DIAGNOSIS — G40909 Epilepsy, unspecified, not intractable, without status epilepticus: Secondary | ICD-10-CM | POA: Diagnosis present

## 2014-12-06 DIAGNOSIS — D519 Vitamin B12 deficiency anemia, unspecified: Secondary | ICD-10-CM | POA: Diagnosis present

## 2014-12-06 DIAGNOSIS — Y832 Surgical operation with anastomosis, bypass or graft as the cause of abnormal reaction of the patient, or of later complication, without mention of misadventure at the time of the procedure: Secondary | ICD-10-CM | POA: Diagnosis present

## 2014-12-06 DIAGNOSIS — F1490 Cocaine use, unspecified, uncomplicated: Secondary | ICD-10-CM | POA: Diagnosis present

## 2014-12-06 DIAGNOSIS — M6281 Muscle weakness (generalized): Secondary | ICD-10-CM | POA: Diagnosis not present

## 2014-12-06 DIAGNOSIS — R52 Pain, unspecified: Secondary | ICD-10-CM | POA: Diagnosis not present

## 2014-12-06 DIAGNOSIS — M3214 Glomerular disease in systemic lupus erythematosus: Secondary | ICD-10-CM

## 2014-12-06 DIAGNOSIS — N186 End stage renal disease: Secondary | ICD-10-CM

## 2014-12-06 DIAGNOSIS — F1290 Cannabis use, unspecified, uncomplicated: Secondary | ICD-10-CM | POA: Diagnosis present

## 2014-12-06 DIAGNOSIS — G4700 Insomnia, unspecified: Secondary | ICD-10-CM | POA: Diagnosis present

## 2014-12-06 DIAGNOSIS — F1010 Alcohol abuse, uncomplicated: Secondary | ICD-10-CM | POA: Diagnosis present

## 2014-12-06 DIAGNOSIS — T827XXA Infection and inflammatory reaction due to other cardiac and vascular devices, implants and grafts, initial encounter: Secondary | ICD-10-CM | POA: Diagnosis present

## 2014-12-06 DIAGNOSIS — E559 Vitamin D deficiency, unspecified: Secondary | ICD-10-CM | POA: Diagnosis present

## 2014-12-06 DIAGNOSIS — D539 Nutritional anemia, unspecified: Secondary | ICD-10-CM | POA: Diagnosis present

## 2014-12-06 DIAGNOSIS — Z79891 Long term (current) use of opiate analgesic: Secondary | ICD-10-CM | POA: Diagnosis not present

## 2014-12-06 DIAGNOSIS — Z9981 Dependence on supplemental oxygen: Secondary | ICD-10-CM | POA: Diagnosis not present

## 2014-12-06 DIAGNOSIS — I120 Hypertensive chronic kidney disease with stage 5 chronic kidney disease or end stage renal disease: Secondary | ICD-10-CM

## 2014-12-06 DIAGNOSIS — J90 Pleural effusion, not elsewhere classified: Secondary | ICD-10-CM | POA: Diagnosis not present

## 2014-12-06 DIAGNOSIS — D696 Thrombocytopenia, unspecified: Secondary | ICD-10-CM | POA: Diagnosis present

## 2014-12-06 DIAGNOSIS — E46 Unspecified protein-calorie malnutrition: Secondary | ICD-10-CM

## 2014-12-06 DIAGNOSIS — Z881 Allergy status to other antibiotic agents status: Secondary | ICD-10-CM | POA: Diagnosis not present

## 2014-12-06 DIAGNOSIS — R262 Difficulty in walking, not elsewhere classified: Secondary | ICD-10-CM | POA: Diagnosis not present

## 2014-12-06 DIAGNOSIS — D631 Anemia in chronic kidney disease: Secondary | ICD-10-CM | POA: Diagnosis present

## 2014-12-06 DIAGNOSIS — K219 Gastro-esophageal reflux disease without esophagitis: Secondary | ICD-10-CM | POA: Diagnosis present

## 2014-12-06 DIAGNOSIS — J449 Chronic obstructive pulmonary disease, unspecified: Secondary | ICD-10-CM | POA: Diagnosis present

## 2014-12-06 DIAGNOSIS — M79622 Pain in left upper arm: Secondary | ICD-10-CM | POA: Diagnosis not present

## 2014-12-06 DIAGNOSIS — Z681 Body mass index (BMI) 19 or less, adult: Secondary | ICD-10-CM | POA: Diagnosis not present

## 2014-12-06 DIAGNOSIS — Z992 Dependence on renal dialysis: Secondary | ICD-10-CM | POA: Diagnosis not present

## 2014-12-06 DIAGNOSIS — R609 Edema, unspecified: Secondary | ICD-10-CM | POA: Diagnosis not present

## 2014-12-06 DIAGNOSIS — I5042 Chronic combined systolic (congestive) and diastolic (congestive) heart failure: Secondary | ICD-10-CM | POA: Diagnosis present

## 2014-12-06 DIAGNOSIS — Z791 Long term (current) use of non-steroidal anti-inflammatories (NSAID): Secondary | ICD-10-CM | POA: Diagnosis not present

## 2014-12-06 DIAGNOSIS — Z87891 Personal history of nicotine dependence: Secondary | ICD-10-CM | POA: Diagnosis not present

## 2014-12-06 DIAGNOSIS — M79602 Pain in left arm: Secondary | ICD-10-CM | POA: Diagnosis not present

## 2014-12-06 DIAGNOSIS — Z8611 Personal history of tuberculosis: Secondary | ICD-10-CM | POA: Diagnosis not present

## 2014-12-06 DIAGNOSIS — F329 Major depressive disorder, single episode, unspecified: Secondary | ICD-10-CM | POA: Diagnosis present

## 2014-12-06 DIAGNOSIS — E876 Hypokalemia: Secondary | ICD-10-CM | POA: Diagnosis not present

## 2014-12-06 DIAGNOSIS — I10 Essential (primary) hypertension: Secondary | ICD-10-CM | POA: Diagnosis not present

## 2014-12-06 DIAGNOSIS — M13812 Other specified arthritis, left shoulder: Secondary | ICD-10-CM | POA: Diagnosis present

## 2014-12-06 DIAGNOSIS — I132 Hypertensive heart and chronic kidney disease with heart failure and with stage 5 chronic kidney disease, or end stage renal disease: Secondary | ICD-10-CM | POA: Diagnosis not present

## 2014-12-06 DIAGNOSIS — M329 Systemic lupus erythematosus, unspecified: Secondary | ICD-10-CM | POA: Diagnosis present

## 2014-12-06 DIAGNOSIS — Z888 Allergy status to other drugs, medicaments and biological substances status: Secondary | ICD-10-CM | POA: Diagnosis not present

## 2014-12-06 DIAGNOSIS — M13811 Other specified arthritis, right shoulder: Secondary | ICD-10-CM | POA: Diagnosis present

## 2014-12-06 DIAGNOSIS — E781 Pure hyperglyceridemia: Secondary | ICD-10-CM | POA: Diagnosis present

## 2014-12-06 DIAGNOSIS — I951 Orthostatic hypotension: Secondary | ICD-10-CM | POA: Diagnosis not present

## 2014-12-06 DIAGNOSIS — Z9049 Acquired absence of other specified parts of digestive tract: Secondary | ICD-10-CM | POA: Diagnosis present

## 2014-12-06 DIAGNOSIS — M7989 Other specified soft tissue disorders: Secondary | ICD-10-CM | POA: Diagnosis not present

## 2014-12-06 DIAGNOSIS — E43 Unspecified severe protein-calorie malnutrition: Secondary | ICD-10-CM | POA: Diagnosis present

## 2014-12-06 DIAGNOSIS — I959 Hypotension, unspecified: Secondary | ICD-10-CM | POA: Diagnosis present

## 2014-12-06 DIAGNOSIS — Z79899 Other long term (current) drug therapy: Secondary | ICD-10-CM | POA: Diagnosis not present

## 2014-12-06 LAB — CBC WITH DIFFERENTIAL/PLATELET
Basophils Absolute: 0 10*3/uL (ref 0.0–0.1)
Basophils Relative: 0 % (ref 0–1)
Eosinophils Absolute: 0.1 10*3/uL (ref 0.0–0.7)
Eosinophils Relative: 1 % (ref 0–5)
HCT: 35.8 % — ABNORMAL LOW (ref 36.0–46.0)
Hemoglobin: 10.9 g/dL — ABNORMAL LOW (ref 12.0–15.0)
Lymphocytes Relative: 25 % (ref 12–46)
Lymphs Abs: 3 10*3/uL (ref 0.7–4.0)
MCH: 30 pg (ref 26.0–34.0)
MCHC: 30.4 g/dL (ref 30.0–36.0)
MCV: 98.6 fL (ref 78.0–100.0)
MONOS PCT: 11 % (ref 3–12)
Monocytes Absolute: 1.3 10*3/uL — ABNORMAL HIGH (ref 0.1–1.0)
NEUTROS ABS: 7.8 10*3/uL — AB (ref 1.7–7.7)
Neutrophils Relative %: 63 % (ref 43–77)
PLATELETS: 199 10*3/uL (ref 150–400)
RBC: 3.63 MIL/uL — ABNORMAL LOW (ref 3.87–5.11)
RDW: 17.9 % — ABNORMAL HIGH (ref 11.5–15.5)
WBC: 12.2 10*3/uL — AB (ref 4.0–10.5)

## 2014-12-06 LAB — COMPREHENSIVE METABOLIC PANEL
ALBUMIN: 1.4 g/dL — AB (ref 3.5–5.0)
ALT: 13 U/L — AB (ref 14–54)
AST: 47 U/L — ABNORMAL HIGH (ref 15–41)
Alkaline Phosphatase: 386 U/L — ABNORMAL HIGH (ref 38–126)
Anion gap: 9 (ref 5–15)
BILIRUBIN TOTAL: 0.6 mg/dL (ref 0.3–1.2)
BUN: 9 mg/dL (ref 6–20)
CO2: 28 mmol/L (ref 22–32)
Calcium: 7.3 mg/dL — ABNORMAL LOW (ref 8.9–10.3)
Chloride: 100 mmol/L — ABNORMAL LOW (ref 101–111)
Creatinine, Ser: 3.02 mg/dL — ABNORMAL HIGH (ref 0.44–1.00)
GFR calc Af Amer: 19 mL/min — ABNORMAL LOW (ref >60)
GFR, EST NON AFRICAN AMERICAN: 16 mL/min — AB (ref >60)
GLUCOSE: 89 mg/dL (ref 65–99)
Potassium: 3.3 mmol/L — ABNORMAL LOW (ref 3.5–5.1)
Sodium: 137 mmol/L (ref 135–145)
TOTAL PROTEIN: 7.5 g/dL (ref 6.5–8.1)

## 2014-12-06 LAB — I-STAT CG4 LACTIC ACID, ED
LACTIC ACID, VENOUS: 1.6 mmol/L (ref 0.5–2.0)
Lactic Acid, Venous: 1.85 mmol/L (ref 0.5–2.0)

## 2014-12-06 MED ORDER — CEFAZOLIN SODIUM 1-5 GM-% IV SOLN
1.0000 g | Freq: Three times a day (TID) | INTRAVENOUS | Status: DC
  Administered 2014-12-06: 1 g via INTRAVENOUS
  Filled 2014-12-06 (×2): qty 50

## 2014-12-06 MED ORDER — CEFAZOLIN SODIUM 1-5 GM-% IV SOLN
1.0000 g | Freq: Two times a day (BID) | INTRAVENOUS | Status: DC
  Administered 2014-12-07: 1 g via INTRAVENOUS
  Filled 2014-12-06 (×2): qty 50

## 2014-12-06 MED ORDER — PIPERACILLIN-TAZOBACTAM IN DEX 2-0.25 GM/50ML IV SOLN
2.2500 g | Freq: Three times a day (TID) | INTRAVENOUS | Status: DC
  Administered 2014-12-06: 2.25 g via INTRAVENOUS
  Filled 2014-12-06 (×2): qty 50

## 2014-12-06 MED ORDER — VANCOMYCIN HCL IN DEXTROSE 1-5 GM/200ML-% IV SOLN
1000.0000 mg | Freq: Once | INTRAVENOUS | Status: AC
  Administered 2014-12-06: 1000 mg via INTRAVENOUS
  Filled 2014-12-06: qty 200

## 2014-12-06 MED ORDER — SODIUM CHLORIDE 0.9 % IV BOLUS (SEPSIS)
250.0000 mL | Freq: Once | INTRAVENOUS | Status: AC
  Administered 2014-12-06: 250 mL via INTRAVENOUS

## 2014-12-06 MED ORDER — SODIUM CHLORIDE 0.9 % IV SOLN: INTRAVENOUS | Status: DC

## 2014-12-06 NOTE — ED Notes (Addendum)
Pt had diarrhea. Pt's sheet, blanket and gown were changed. Pt was placed on a chux.

## 2014-12-06 NOTE — H&P (Signed)
Date: 12/06/2014               Patient Name:  [name] [name] MRN: [id number]  DOB: 1956-01-21 Age / Sex: 59 y.o., female   PCP: Otho Bellows, MD         Medical Service: Internal Medicine Teaching Service         Attending Physician: Dr. Oval Linsey, MD    First Contact: Dr. Randell Patient Pager: [phone]  Second Contact: Dr. 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  She underwent AV graft placement on December 03, 2014.  She reports that she was doing fine following the surgery, until yesterday (7/2), when she started to note pain at the surgery site.  This morning (7/3), she noted increased arm pain, redness, and swelling.  She also endorses chills, sweats, nausea, and vomiting.  She denies fevers.  Her last dialysis was Saturday (7/2) through temporary RIJ access.  She denies receiving blood products at that time.  In the ED, she was noted to have a temp of 38.1C, tachycardic at 95-120 bpm, tachypneic at 20-22 breaths/min, and WBC of 12.2.  Blood cultures were obtained and the patient was started on Vanc and Zosyn.  Meds: Current Facility-Administered Medications  Medication Dose Route Frequency Provider Last Rate Last Dose  . [START ON 12/07/2014] ceFAZolin (ANCEF) IVPB 1 g/50 mL premix  1 g Intravenous Q12H Franky Macho, RPH        Allergies: Allergies as of 12/06/2014 - Review Complete 12/06/2014  Allergen Reaction Noted  . Amitriptyline hcl Swelling   . Doxycycline hyclate Itching 11/10/2010   Past Medical History  Diagnosis Date  . Anemia, B12  deficiency   . History of acute pancreatitis   . Right knee pain     No recent imaging on chart  . 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 Dr. Mancel Bale, [facility]  . Hypertriglyceridemia   . GERD (gastroesophageal reflux disease)   . Subdural hematoma 02/2008    Likely 2/2 trauma from seizure from EtOH withdrawal, chronic in nature, sees Dr. Jerene Bears. Most recent CT head 10/2009 showing stable but persistent hematoma without mass effect.  . History of seizure disorder     Likely 2/2 alcohol abuse  . Hypocalcemia   . Hypomagnesemia   . Failure to thrive in childhood     Unclear etiology  . HTN (hypertension)   . Thrombocytopenia   . Hepatomegaly     On exam  . Joint pain   . Alcohol abuse   . Vitamin D deficiency   . Pancreatitis   . Insomnia   . Hyperlipidemia   . Pernicious anemia   . Macrocytic anemia   . 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 Depression   . Fx humeral neck 04/17/2011    Transverse fracture- minimally displaced- managed as outpatient   . ABNORMAL PAP SMEAR, LGSIL 07/23/2008    Annotation: HPV positive CIN I Dr. Mancel Bale,  [facility] Qualifier: Diagnosis of  By: Oretha Ellis    . Pneumonia 05/20/2012  . Arthritis     "shoulders" (08/15/2013)  . 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  Marland Kitchen Chronic diastolic CHF (congestive heart failure)   . Hypomagnesemia   . Seizures     "don't know when/why I had them; daughter was always there w/me"  . On home oxygen therapy     "3L; mostly at night" (06/19/2014)  . Shortness of breath dyspnea   . Pleural effusion    Past Surgical History  Procedure Laterality Date  . Cesarean section  1983  . Esophagogastroduodenoscopy  07/11/2011    Procedure: ESOPHAGOGASTRODUODENOSCOPY (EGD);  Surgeon: Beryle Beams, MD;  Location: [facility];  Service: Endoscopy;  Laterality: N/A;  . Colonoscopy  07/11/2011      Procedure: COLONOSCOPY;  Surgeon: Beryle Beams, MD;  Location: [facility];  Service: Endoscopy;  Laterality: N/A;  . Eye surgery Left     "trauma"  . Right colectomy  08/28/2011  . Esophagogastroduodenoscopy N/A 12/01/2012    Procedure: ESOPHAGOGASTRODUODENOSCOPY (EGD);  Surgeon: Irene Shipper, MD;  Location: [facility];  Service: Endoscopy;  Laterality: N/A;  . Colonoscopy with esophagogastroduodenoscopy (egd) Left 08/21/2013    Procedure: COLONOSCOPY WITH ESOPHAGOGASTRODUODENOSCOPY (EGD);  Surgeon: Beryle Beams, MD;  Location: [facility];  Service: Endoscopy;  Laterality: Left;  . Subxyphoid pericardial window N/A 12/04/2013    Procedure: SUBXYPHOID PERICARDIAL WINDOW WITH TEE;  Surgeon: Ivin Poot, MD;  Location: [place];  Service: Thoracic;  Laterality: N/A;  . Intraoperative transesophageal echocardiogram N/A 12/04/2013    Procedure: INTRAOPERATIVE TRANSESOPHAGEAL ECHOCARDIOGRAM;  Surgeon: Ivin Poot, MD;  Location: [place];  Service: Open Heart Surgery;  Laterality: N/A;  . Exchange of a dialysis catheter Left 10/22/2014    Procedure: EXCHANGE OF A DIALYSIS CATHETER;  Surgeon: Conrad Peck, MD;  Location: [place];  Service: Vascular;  Laterality: Left;  . Av fistula placement Left 12/03/2014    Procedure: INSERTION OF ARTERIOVENOUS (AV) GORE-TEX GRAFT ARM;  Surgeon: Mal Misty, MD;  Location: [facility] OR;  Service: Vascular;  Laterality: Left;   Family History  Problem Relation Age of Onset  . Cancer Mother     Died from stomach cancer and "flesh eating rash  . Heart failure Father     Died in 54s from an MI  . Alcohol abuse Sister     Twin sister drinks a lot, as did both her parents and brothers  . Stroke Brother     Has 7 brothers, 1 with CVA  . Lupus Mother    History   Social History  . Marital Status: Divorced    Spouse Name: N/A  . Number of Children: N/A  . Years of Education: N/A   Occupational History  . Not on file.   Social History Main Topics  .  Smoking status: Former Smoker -- 0.50 packs/day for 40 years    Types: Cigarettes    Quit date: 09/20/2010  . Smokeless tobacco: Never Used  . Alcohol Use: 0.0 oz/week    0 Standard drinks or equivalent per week     Comment: 12/02/14 "graduated from [place] (for alcohol abuse) in October 2015"   . Drug Use: Yes    Special: Marijuana, Cocaine     Comment: 12/02/14 "last drug use was in 2012"  . Sexual Activity: Not Currently  Other Topics Concern  . Not on file   Social History Narrative   Lives with her significant other and 2 grandchildren. 1 child   Has 7 brothers and 4 sisters, 1 twin sister.   Unemployed, worked in [employer].    Abuses alcohol-drinks 1 glass of wine daily    No drug use. Former cigarette use quit 1.5 years ago.     11 th grade education             Review of Systems: Pertinent items are noted in HPI.  Physical Exam: Blood pressure 123/83, pulse 99, temperature 100.9 F (38.3 C), temperature source Oral, resp. rate 18, weight 87 lb (39.463 kg), SpO2 100 %. Physical Exam  Constitutional:  Frail, appears older than stated age, in NAD  HENT:  Head: Normocephalic and atraumatic.  Eyes: EOM are normal.  Neck: No JVD present. No tracheal deviation present.  Cardiovascular: Normal rate, regular rhythm and normal heart sounds.  Exam reveals no gallop and no friction rub.   No murmur heard. Left radial pulse could not be palpated.  Distal pulses could not be palpated  Pulmonary/Chest: Effort normal and breath sounds normal. No respiratory distress. She has no wheezes.  Minimal crackles in b/l bases  Abdominal: Soft. Bowel sounds are normal. There is no tenderness. There is no rebound and no guarding.  Skin:  RUE and LUE warm and dry.  B/l lower extremities, warm and dry, without edema.  LUE demonstrated intact surgical incisions with sutures in place, without drainage.  Incisions and surrounding skin erythematous, warm, and tender to light touch.  1+ pitting edema of left arm.  No palpable thrill over graft.     Lab results: Basic Metabolic Panel:  Recent Labs  12/06/14 1720  NA 137  K 3.3*  CL 100*  CO2 28  GLUCOSE 89  BUN 9  CREATININE 3.02*  CALCIUM 7.3*   Liver Function Tests:  Recent Labs  12/06/14 1720  AST 47*  ALT 13*  ALKPHOS 386*  BILITOT 0.6  PROT 7.5  ALBUMIN 1.4*   No results for input(s): LIPASE, AMYLASE in the last 72 hours. No results for input(s): AMMONIA in the last 72 hours. CBC:  Recent Labs  12/06/14 1720  WBC 12.2*  NEUTROABS 7.8*  HGB 10.9*  HCT 35.8*  MCV 98.6  PLT 199   Cardiac Enzymes: No results for input(s): CKTOTAL, CKMB, CKMBINDEX, TROPONINI in the last 72 hours. BNP: No results for input(s): PROBNP in the last 72 hours. D-Dimer: No results for input(s): DDIMER in the last 72 hours. CBG: No results for input(s): GLUCAP in the last 72 hours. Hemoglobin A1C: No results for input(s): HGBA1C in the last 72 hours. Fasting Lipid Panel: No results for input(s): CHOL, HDL, LDLCALC, TRIG, CHOLHDL, LDLDIRECT in the last 72 hours. Thyroid Function Tests: No results for input(s): TSH, T4TOTAL, FREET4, T3FREE, THYROIDAB in the last 72 hours. Anemia Panel: No results for input(s): VITAMINB12, FOLATE, FERRITIN, TIBC, IRON, RETICCTPCT in the last 72 hours. Coagulation: No results for input(s): LABPROT, INR in the last 72 hours. Urine Drug Screen: Drugs of Abuse     Component Value Date/Time   LABOPIA POSITIVE* 10/13/2014 0412   LABOPIA NEG 09/18/2011 0936   COCAINSCRNUR NONE DETECTED 10/13/2014 0412   COCAINSCRNUR NEG 09/18/2011 0936   LABBENZ NONE DETECTED 10/13/2014 0412   LABBENZ NEG 09/18/2011 0936   LABBENZ NEG 04/10/2011 1130   AMPHETMU NONE DETECTED 10/13/2014 0412   AMPHETMU NEG 09/18/2011 9604  AMPHETMU NEG 04/10/2011 1130   THCU NONE DETECTED 10/13/2014 0412   THCU NEG 09/18/2011 0936   LABBARB NONE DETECTED 10/13/2014 0412   LABBARB NEG 09/18/2011 0936      Alcohol Level: No results for input(s): ETH in the last 72 hours. Urinalysis: No results for input(s): COLORURINE, LABSPEC, PHURINE, GLUCOSEU, HGBUR, BILIRUBINUR, KETONESUR, PROTEINUR, UROBILINOGEN, NITRITE, LEUKOCYTESUR in the last 72 hours.  Invalid input(s): APPERANCEUR   Imaging results:  Dg Chest Port 1 View  12/06/2014   CLINICAL DATA:  Right breast swelling.  Hypotension.  EXAM: PORTABLE CHEST - 1 VIEW  COMPARISON:  10/29/2014 and 10/22/2014  FINDINGS: Double lumen dialysis catheter appears in good position with the tips in the right atrium. Chronic cardiomegaly. No residual left effusion. Small residual right effusion, diminished. Pulmonary vascular congestion has resolved.  IMPRESSION: Resolution of pulmonary vascular congestion and left effusion. Decreased right effusion.   Electronically Signed   By: [name] M.D.   On: 12/06/2014 17:46    Other results: EKG: sinus tachycardia, LAE.  Assessment & Plan by Problem: Active Problems:   Arteriovenous fistula infection  Ms. 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  1. Left Arm Pain/Swelling: Patient presents with worsening pain, swelling, and erythema following AV graft placement.  Symptoms certainly concerning for infection (i.e., cellulitis).  However, no thrill and absent radial pulse following implantation of gore-tex graft concerning for clot and occlusion of graft, leading to swelling of the arm.  Will continue antibiotics (Ancef/Vanc, per cellulitis protocol) and obtain duplex ultrasound to rule out clot.  - LUE duplex US - Stop Zosyn (7/3) - Continue Vancomycin (7/3 - ) - Start Ancef (7/3 - ) - F/u blood cultures (7/3) - C/s Vascular surgery (per ED, vascular surgery will evaluate 7/4) - Morphine  1 mg IV q3hours PRN pain  2. ESRD: Patient reports she is supposed to start receiving dialysis MWF. - Nephrology consult 7/4 for dialysis  3. HTN: Patient hypotensive to 80/55 in ED. Hold BP meds for now - Hold home BP medications  4. Malnutrition: Patient underweight 2/2 chronic alcohol abuse.  Will continue vitamin and diet supplementation. - Continue feeding supplement, Creon, B12, folate, and multivitamin  Depression: Continue Fluoxetine 10 mg daily  Dispo: Disposition is deferred at this time, awaiting improvement of current medical problems.  The patient does have a current PCP Otho Bellows, MD) and does need an [facility] hospital follow-up appointment after discharge.  The patient does not have transportation limitations that hinder transportation to clinic appointments.  Signed: Iline Oven, MD 12/06/2014, 11:56 PM

## 2014-12-06 NOTE — ED Notes (Signed)
Pt is having dry heaves and vomiting clear liquids. BP checked in left thigh+ 142/81, right thigh = 141/67. Dr. Ralene Bathe made aware.

## 2014-12-06 NOTE — Progress Notes (Addendum)
ANTIBIOTIC CONSULT NOTE - INITIAL  Pharmacy Consult for vanc/zosyn Indication: Cellulitis  Allergies  Allergen Reactions  . Amitriptyline Hcl Swelling    In the face.  . Doxycycline Hyclate Itching    Feels like something crawling under her skin    Patient Measurements: Weight: 87 lb (39.463 kg) Adjusted Body Weight:   Vital Signs: Temp: 98.2 F (36.8 C) (07/03 1624) Temp Source: Oral (07/03 1624) BP: 75/56 mmHg (07/03 1624) Pulse Rate: 104 (07/03 1624) Intake/Output from previous day:   Intake/Output from this shift:    Labs: No results for input(s): WBC, HGB, PLT, LABCREA, CREATININE in the last 72 hours. CrCl cannot be calculated (Patient has no serum creatinine result on file.). No results for input(s): VANCOTROUGH, VANCOPEAK, VANCORANDOM, GENTTROUGH, GENTPEAK, GENTRANDOM, TOBRATROUGH, TOBRAPEAK, TOBRARND, AMIKACINPEAK, AMIKACINTROU, AMIKACIN in the last 72 hours.   Microbiology: No results found for this or any previous visit (from the past 720 hour(s)).  Medical History: Past Medical History  Diagnosis Date  . Anemia, B12 deficiency   . History of acute pancreatitis   . Right knee pain     No recent imaging on chart  . 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 Dr. Mancel Bale, [facility]  . Hypertriglyceridemia   . GERD (gastroesophageal reflux disease)   . Subdural hematoma 02/2008    Likely 2/2 trauma from seizure from EtOH withdrawal, chronic in nature, sees Dr. Jerene Bears. Most recent CT head 10/2009 showing stable but persistent hematoma without mass effect.  . History of seizure disorder     Likely 2/2 alcohol abuse  . Hypocalcemia   . Hypomagnesemia   . Failure to thrive in childhood     Unclear etiology  . HTN (hypertension)   . Thrombocytopenia   . Hepatomegaly     On exam  . Joint pain   . Alcohol abuse   . Vitamin D deficiency   . Pancreatitis   . Insomnia   . Hyperlipidemia   .  Pernicious anemia   . Macrocytic anemia   . 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 Depression   . Fx humeral neck 04/17/2011    Transverse fracture- minimally displaced- managed as outpatient   . ABNORMAL PAP SMEAR, LGSIL 07/23/2008    Annotation: HPV positive CIN I Dr. Mancel Bale, [facility] Qualifier: Diagnosis of  By: Oretha Ellis    . Pneumonia 05/20/2012  . Arthritis     "shoulders" (08/15/2013)  . 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  Marland Kitchen Chronic diastolic CHF (congestive heart failure)   . Hypomagnesemia   . Seizures     "don't know when/why I had them; daughter was always there w/me"  . On home oxygen therapy     "3L; mostly at night" (06/19/2014)  . Shortness of breath dyspnea   . Pleural effusion     Medications:  Scheduled:   Infusions:  . sodium chloride     Assessment: 59 yo ESRD due to lupus. Recently she got an AV graft placed. Now presenting with arm swelling and pain. Vanc/zosyn have been ordered for cellulitis.   Goal of Therapy:  PreHD vanc = 15-25  Plan:   Vanc 1g IV x1  Zosyn 2.25g IV q8  Onnie Boer, PharmD Pager: [phone] 12/06/2014 5:41 PM

## 2014-12-06 NOTE — ED Provider Notes (Signed)
CSN: [id number]     Arrival date & time 12/06/14  1610 History   First MD Initiated Contact with Patient 12/06/14 1634     Chief Complaint  Patient presents with  . Arm Swelling     The history is provided by the patient. No language interpreter was used.   [name] [name] presents for evaluation of arm swelling. She had a vascular graft placed in her left upper extremity 3 days ago. She was dialyzed yesterday through a Vas-Cath in her chest. Last night she developed significant swelling to her left upper extremity. When her nurse came to check her arm they were concerned that there could be a clot and she was referred to the emergency department. She denies any fevers, chest pain, shortness of breath. She does have night sweats but these aren't ongoing problem. Dry weight is 38 kg.  Past Medical History  Diagnosis Date  . Anemia, B12 deficiency   . History of acute pancreatitis   . Right knee pain     No recent imaging on chart  . 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 Dr. Mancel Bale, [facility]  . Hypertriglyceridemia   . GERD (gastroesophageal reflux disease)   . Subdural hematoma 02/2008    Likely 2/2 trauma from seizure from EtOH withdrawal, chronic in nature, sees Dr. Jerene Bears. Most recent CT head 10/2009 showing stable but persistent hematoma without mass effect.  . History of seizure disorder     Likely 2/2 alcohol abuse  . Hypocalcemia   . Hypomagnesemia   . Failure to thrive in childhood     Unclear etiology  . HTN (hypertension)   . Thrombocytopenia   . Hepatomegaly     On exam  . Joint pain   . Alcohol abuse   . Vitamin D deficiency   . Pancreatitis   . Insomnia   . Hyperlipidemia   . Pernicious anemia   . Macrocytic anemia   . 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 Depression   . Fx humeral neck 04/17/2011    Transverse fracture- minimally displaced- managed as outpatient   . ABNORMAL PAP SMEAR, LGSIL  07/23/2008    Annotation: HPV positive CIN I Dr. Mancel Bale, [facility] Qualifier: Diagnosis of  By: Oretha Ellis    . Pneumonia 05/20/2012  . Arthritis     "shoulders" (08/15/2013)  . 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  Marland Kitchen Chronic diastolic CHF (congestive heart failure)   . Hypomagnesemia   . Seizures     "don't know when/why I had them; daughter was always there w/me"  . On home oxygen therapy     "3L; mostly at night" (06/19/2014)  . Shortness of breath dyspnea   . Pleural effusion    Past Surgical History  Procedure Laterality Date  . Cesarean section  1983  . Esophagogastroduodenoscopy  07/11/2011    Procedure: ESOPHAGOGASTRODUODENOSCOPY (EGD);  Surgeon: Beryle Beams, MD;  Location: [facility];  Service: Endoscopy;  Laterality: N/A;  . Colonoscopy  07/11/2011    Procedure: COLONOSCOPY;  Surgeon: Beryle Beams, MD;  Location: [facility] ENDOSCOPY;  Service: Endoscopy;  Laterality: N/A;  . Eye surgery Left     "trauma"  . Right colectomy  08/28/2011  . Esophagogastroduodenoscopy N/A 12/01/2012    Procedure: ESOPHAGOGASTRODUODENOSCOPY (EGD);  Surgeon: Irene Shipper, MD;  Location: [facility] ENDOSCOPY;  Service: Endoscopy;  Laterality: N/A;  . Colonoscopy with esophagogastroduodenoscopy (egd) Left 08/21/2013    Procedure: COLONOSCOPY WITH ESOPHAGOGASTRODUODENOSCOPY (EGD);  Surgeon: Beryle Beams, MD;  Location: [facility] ENDOSCOPY;  Service: Endoscopy;  Laterality: Left;  . Subxyphoid pericardial window N/A 12/04/2013    Procedure: SUBXYPHOID PERICARDIAL WINDOW WITH TEE;  Surgeon: Ivin Poot, MD;  Location: [facility];  Service: Thoracic;  Laterality: N/A;  . Intraoperative transesophageal echocardiogram N/A 12/04/2013    Procedure: INTRAOPERATIVE TRANSESOPHAGEAL ECHOCARDIOGRAM;  Surgeon: Ivin Poot, MD;  Location: [place];  Service: Open Heart Surgery;  Laterality: N/A;  . Exchange of a dialysis catheter Left 10/22/2014    Procedure: EXCHANGE OF A  DIALYSIS CATHETER;  Surgeon: Conrad Stanley, MD;  Location: [facility];  Service: Vascular;  Laterality: Left;  . Av fistula placement Left 12/03/2014    Procedure: INSERTION OF ARTERIOVENOUS (AV) GORE-TEX GRAFT ARM;  Surgeon: Mal Misty, MD;  Location: [facility] OR;  Service: Vascular;  Laterality: Left;   Family History  Problem Relation Age of Onset  . Cancer Mother     Died from stomach cancer and "flesh eating rash  . Heart failure Father     Died in 16s from an MI  . Alcohol abuse Sister     Twin sister drinks a lot, as did both her parents and brothers  . Stroke Brother     Has 7 brothers, 1 with CVA  . Lupus Mother    History  Substance Use Topics  . Smoking status: Former Smoker -- 0.50 packs/day for 40 years    Types: Cigarettes    Quit date: 09/20/2010  . Smokeless tobacco: Never Used  . Alcohol Use: 0.0 oz/week    0 Standard drinks or equivalent per week     Comment: 12/02/14 "graduated from [employer] (for alcohol abuse) in October 2015"    OB History    No data available     Review of Systems  All other systems reviewed and are negative.     Allergies  Amitriptyline hcl and Doxycycline hyclate  Home Medications   Prior to Admission medications   Medication Sig Start Date End Date Taking? Authorizing Provider  albuterol (PROAIR HFA) 108 (90 BASE) MCG/ACT inhaler Inhale 1-2 puffs into the lungs every 6 (six) hours as needed for wheezing or shortness of breath. 01/14/14   Jones Bales, MD  cetirizine (ZYRTEC) 10 MG tablet Take 1 tablet (10 mg total) by mouth daily as needed for allergies. 09/20/14   Otho Bellows, MD  CREON 12000 UNITS CPEP capsule Take 12,000 Units by mouth 3 (three) times daily before meals.  09/29/14   Historical Provider, MD  diclofenac sodium (VOLTAREN) 1 % GEL Apply 4 g topically daily as needed (pain).    Historical Provider, MD  FLUoxetine (PROZAC) 10 MG capsule Take 1 capsule (10 mg total) by mouth daily. For depression 03/26/14    Otho Bellows, MD  folic acid (FOLVITE) 1 MG tablet Take 1 tablet (1 mg total) by mouth daily. For folic acid replacement 09/20/14   Otho Bellows, MD  Lactobacillus (ACIDOPHILUS PROBIOTIC PO) Take 1 mg by mouth 3 (three) times daily. Take 2 (two) tablets by mouth three times daily    Historical Provider, MD  lisinopril (PRINIVIL,ZESTRIL) 2.5 MG tablet Take 2.5 mg by mouth daily.    Historical Provider, MD  loperamide (IMODIUM) 2 MG capsule Take 1 capsule (2 mg total) by mouth as needed for diarrhea  or loose stools. 09/23/14   Luan Moore, MD  mirtazapine (REMERON) 7.5 MG tablet Take 7.5 mg by mouth at bedtime.    Historical Provider, MD  Multiple Vitamin (MULTIVITAMIN WITH MINERALS) TABS tablet Take 1 tablet by mouth daily. For vitamin replacement    Otho Bellows, MD  Nutritional Supplements (FEEDING SUPPLEMENT, NEPRO CARB STEADY,) LIQD Take 237 mLs by mouth 3 (three) times daily with meals. 10/29/14   Charlesetta Shanks, MD  omeprazole (PRILOSEC) 40 MG capsule Take 1 capsule (40 mg total) by mouth daily. 03/26/14   Otho Bellows, MD  oxyCODONE (OXY IR/ROXICODONE) 5 MG immediate release tablet Take 1 tablet (5 mg total) by mouth every 6 (six) hours as needed for moderate pain or severe pain. 12/03/14   Samantha J Rhyne, PA-C  ramelteon (ROZEREM) 8 MG tablet Take 1 tablet (8 mg total) by mouth at bedtime. 10/29/14   Charlesetta Shanks, MD  thiamine (VITAMIN B-1) 100 MG tablet Take 1 tablet (100 mg total) by mouth daily. For low thiamine 09/22/14   Otho Bellows, MD  vitamin B-12 (CYANOCOBALAMIN) 250 MCG tablet Take 1 tablet (250 mcg total) by mouth every evening. 04/10/13   Otho Bellows, MD   BP 75/56 mmHg  Pulse 104  Temp(Src) 98.2 F (36.8 C) (Oral)  Resp 20  SpO2 96% Physical Exam  Constitutional: She is oriented to person, place, and time. She appears well-developed.  Frail  HENT:  Head: Normocephalic and atraumatic.  Cardiovascular: Regular rhythm.   No murmur heard. Tachycardic,  no murmur  Pulmonary/Chest: Effort normal and breath sounds normal. No respiratory distress.  Vas-Cath and right anterior chest. Dressing is clean dry and intact.  Abdominal: Soft. There is no tenderness. There is no rebound and no guarding.  Musculoskeletal:  Left upper extremity with moderate erythema and edema. Surgical site in the left axilla and left upper arm is clean, dry, intact. Palpable thrill over her fistula site. Absent radial pulse. Left hand is warm and well perfused.  Neurological: She is alert and oriented to person, place, and time.  Skin: Skin is warm and dry.  Psychiatric: She has a normal mood and affect. Her behavior is normal.  Nursing note and vitals reviewed.   ED Course  Procedures (including critical care time) Labs Review Labs Reviewed  COMPREHENSIVE METABOLIC PANEL - Abnormal; Notable for the following:    Potassium 3.3 (*)    Chloride 100 (*)    Creatinine, Ser 3.02 (*)    Calcium 7.3 (*)    Albumin 1.4 (*)    AST 47 (*)    ALT 13 (*)    Alkaline Phosphatase 386 (*)    GFR calc non Af Amer 16 (*)    GFR calc Af Amer 19 (*)    All other components within normal limits  CBC WITH DIFFERENTIAL/PLATELET - Abnormal; Notable for the following:    WBC 12.2 (*)    RBC 3.63 (*)    Hemoglobin 10.9 (*)    HCT 35.8 (*)    RDW 17.9 (*)    Neutro Abs 7.8 (*)    Monocytes Absolute 1.3 (*)    All other components within normal limits  CULTURE, BLOOD (ROUTINE X 2)  CULTURE, BLOOD (ROUTINE X 2)  URINE CULTURE  MRSA PCR SCREENING  URINALYSIS, ROUTINE W REFLEX MICROSCOPIC (NOT AT [facility])  I-STAT CG4 LACTIC ACID, ED  I-STAT CG4 LACTIC ACID, ED    Imaging Review Dg Chest [facility] 1 [address]  12/06/2014   CLINICAL DATA:  Right breast swelling.  Hypotension.  EXAM: PORTABLE CHEST - 1 VIEW  COMPARISON:  10/29/2014 and 10/22/2014  FINDINGS: Double lumen dialysis catheter appears in good position with the tips in the right atrium. Chronic cardiomegaly. No residual left  effusion. Small residual right effusion, diminished. Pulmonary vascular congestion has resolved.  IMPRESSION: Resolution of pulmonary vascular congestion and left effusion. Decreased right effusion.   Electronically Signed   By: [name] M.D.   On: 12/06/2014 17:46     EKG Interpretation None      MDM   Final diagnoses:  Swelling  Pain    Patient here for evaluation of swelling to her vascular graft site that was placed 3 days ago. Patient with marked erythema and edema to the left upper extremity. There is no evidence of abscess on examination. Patient is well perfused. She did have hypotension on ED arrival when blood pressures were checked on her right upper extremity. When the cuff was moved to her lower extremity her blood pressures were improved. Patient does have a low-grade fever, leukocytosis. There is concern that patient may have an early infection at her surgery site. Discussed with Dr. Trula Slade with vascular.  Discussed with the internal medicine service regarding admission for further management.  Quintella Reichert, MD 12/07/14 [phone]

## 2014-12-06 NOTE — ED Notes (Signed)
Difficult IV stick-- IV team at bedside.

## 2014-12-06 NOTE — ED Notes (Signed)
Presents with left arm swelling, redness, warmth and pain-began Friday after fistula surgery Thursday. Pain 9/10, able to wiggle fingers, fingers warm. Dialysis patient

## 2014-12-06 NOTE — ED Notes (Signed)
PT DOES NOT MAKE URINE

## 2014-12-07 ENCOUNTER — Inpatient Hospital Stay (HOSPITAL_COMMUNITY): Payer: Medicare Other

## 2014-12-07 ENCOUNTER — Encounter (HOSPITAL_COMMUNITY): Payer: Self-pay | Admitting: *Deleted

## 2014-12-07 DIAGNOSIS — M79622 Pain in left upper arm: Secondary | ICD-10-CM

## 2014-12-07 DIAGNOSIS — R52 Pain, unspecified: Secondary | ICD-10-CM

## 2014-12-07 DIAGNOSIS — T827XXA Infection and inflammatory reaction due to other cardiac and vascular devices, implants and grafts, initial encounter: Secondary | ICD-10-CM | POA: Insufficient documentation

## 2014-12-07 DIAGNOSIS — F1020 Alcohol dependence, uncomplicated: Secondary | ICD-10-CM

## 2014-12-07 DIAGNOSIS — Z992 Dependence on renal dialysis: Secondary | ICD-10-CM

## 2014-12-07 DIAGNOSIS — R609 Edema, unspecified: Secondary | ICD-10-CM | POA: Diagnosis present

## 2014-12-07 DIAGNOSIS — N186 End stage renal disease: Secondary | ICD-10-CM | POA: Diagnosis present

## 2014-12-07 DIAGNOSIS — M7989 Other specified soft tissue disorders: Secondary | ICD-10-CM

## 2014-12-07 DIAGNOSIS — I132 Hypertensive heart and chronic kidney disease with heart failure and with stage 5 chronic kidney disease, or end stage renal disease: Secondary | ICD-10-CM

## 2014-12-07 DIAGNOSIS — I10 Essential (primary) hypertension: Secondary | ICD-10-CM | POA: Diagnosis present

## 2014-12-07 DIAGNOSIS — I5042 Chronic combined systolic (congestive) and diastolic (congestive) heart failure: Secondary | ICD-10-CM

## 2014-12-07 LAB — VANCOMYCIN, RANDOM: Vancomycin Rm: 23 ug/mL

## 2014-12-07 LAB — COMPREHENSIVE METABOLIC PANEL
ALBUMIN: 1.2 g/dL — AB (ref 3.5–5.0)
ALT: 9 U/L — AB (ref 14–54)
AST: 33 U/L (ref 15–41)
Alkaline Phosphatase: 276 U/L — ABNORMAL HIGH (ref 38–126)
Anion gap: 11 (ref 5–15)
BUN: 11 mg/dL (ref 6–20)
CO2: 26 mmol/L (ref 22–32)
CREATININE: 3.65 mg/dL — AB (ref 0.44–1.00)
Calcium: 6.9 mg/dL — ABNORMAL LOW (ref 8.9–10.3)
Chloride: 98 mmol/L — ABNORMAL LOW (ref 101–111)
GFR calc Af Amer: 15 mL/min — ABNORMAL LOW (ref >60)
GFR calc non Af Amer: 13 mL/min — ABNORMAL LOW (ref >60)
GLUCOSE: 74 mg/dL (ref 65–99)
Potassium: 3.1 mmol/L — ABNORMAL LOW (ref 3.5–5.1)
Sodium: 135 mmol/L (ref 135–145)
Total Bilirubin: 0.7 mg/dL (ref 0.3–1.2)
Total Protein: 6.1 g/dL — ABNORMAL LOW (ref 6.5–8.1)

## 2014-12-07 LAB — CBC
HEMATOCRIT: 30.8 % — AB (ref 36.0–46.0)
Hemoglobin: 9.3 g/dL — ABNORMAL LOW (ref 12.0–15.0)
MCH: 29 pg (ref 26.0–34.0)
MCHC: 30.2 g/dL (ref 30.0–36.0)
MCV: 96 fL (ref 78.0–100.0)
Platelets: 165 10*3/uL (ref 150–400)
RBC: 3.21 MIL/uL — ABNORMAL LOW (ref 3.87–5.11)
RDW: 17.9 % — ABNORMAL HIGH (ref 11.5–15.5)
WBC: 10.2 10*3/uL (ref 4.0–10.5)

## 2014-12-07 LAB — MRSA PCR SCREENING: MRSA by PCR: NEGATIVE

## 2014-12-07 LAB — MAGNESIUM: Magnesium: 1.5 mg/dL — ABNORMAL LOW (ref 1.7–2.4)

## 2014-12-07 MED ORDER — ALBUTEROL SULFATE (2.5 MG/3ML) 0.083% IN NEBU
3.0000 mL | INHALATION_SOLUTION | Freq: Four times a day (QID) | RESPIRATORY_TRACT | Status: DC | PRN
Start: 2014-12-07 — End: 2014-12-10

## 2014-12-07 MED ORDER — MAGNESIUM SULFATE IN D5W 10-5 MG/ML-% IV SOLN
1.0000 g | Freq: Once | INTRAVENOUS | Status: AC
  Administered 2014-12-07: 1 g via INTRAVENOUS
  Filled 2014-12-07: qty 100

## 2014-12-07 MED ORDER — FLUOXETINE HCL 10 MG PO CAPS
10.0000 mg | ORAL_CAPSULE | Freq: Every day | ORAL | Status: DC
  Administered 2014-12-07 – 2014-12-09 (×3): 10 mg via ORAL
  Filled 2014-12-07 (×3): qty 1

## 2014-12-07 MED ORDER — RENA-VITE PO TABS
1.0000 | ORAL_TABLET | Freq: Every day | ORAL | Status: DC
  Administered 2014-12-07 – 2014-12-09 (×3): 1 via ORAL
  Filled 2014-12-07 (×3): qty 1

## 2014-12-07 MED ORDER — NEPRO/CARBSTEADY PO LIQD
237.0000 mL | Freq: Three times a day (TID) | ORAL | Status: DC
  Administered 2014-12-07: 237 mL via ORAL

## 2014-12-07 MED ORDER — CEFAZOLIN SODIUM 1-5 GM-% IV SOLN
1.0000 g | INTRAVENOUS | Status: DC
  Administered 2014-12-08: 1 g via INTRAVENOUS
  Filled 2014-12-07 (×2): qty 50

## 2014-12-07 MED ORDER — ADULT MULTIVITAMIN W/MINERALS CH
1.0000 | ORAL_TABLET | Freq: Every day | ORAL | Status: DC
  Administered 2014-12-07: 1 via ORAL
  Filled 2014-12-07: qty 1

## 2014-12-07 MED ORDER — FOLIC ACID 1 MG PO TABS
1.0000 mg | ORAL_TABLET | Freq: Every day | ORAL | Status: DC
  Administered 2014-12-07 – 2014-12-08 (×2): 1 mg via ORAL
  Filled 2014-12-07 (×3): qty 1

## 2014-12-07 MED ORDER — CAMPHOR-MENTHOL 0.5-0.5 % EX LOTN
1.0000 "application " | TOPICAL_LOTION | CUTANEOUS | Status: DC | PRN
  Filled 2014-12-07: qty 222

## 2014-12-07 MED ORDER — DARBEPOETIN ALFA 100 MCG/0.5ML IJ SOSY
100.0000 ug | PREFILLED_SYRINGE | INTRAMUSCULAR | Status: DC
  Filled 2014-12-07: qty 0.5

## 2014-12-07 MED ORDER — VANCOMYCIN HCL 500 MG IV SOLR
500.0000 mg | INTRAVENOUS | Status: DC
  Administered 2014-12-07: 500 mg via INTRAVENOUS
  Filled 2014-12-07: qty 500

## 2014-12-07 MED ORDER — ACETAMINOPHEN 325 MG PO TABS
650.0000 mg | ORAL_TABLET | ORAL | Status: DC | PRN
  Administered 2014-12-08 (×2): 650 mg via ORAL
  Filled 2014-12-07 (×3): qty 2

## 2014-12-07 MED ORDER — POTASSIUM CHLORIDE CRYS ER 20 MEQ PO TBCR
40.0000 meq | EXTENDED_RELEASE_TABLET | Freq: Once | ORAL | Status: AC
  Administered 2014-12-07: 40 meq via ORAL
  Filled 2014-12-07: qty 2

## 2014-12-07 MED ORDER — MORPHINE SULFATE 2 MG/ML IJ SOLN
1.0000 mg | INTRAMUSCULAR | Status: DC | PRN
  Administered 2014-12-07 (×2): 1 mg via INTRAVENOUS
  Filled 2014-12-07 (×2): qty 1

## 2014-12-07 MED ORDER — PANCRELIPASE (LIP-PROT-AMYL) 12000-38000 UNITS PO CPEP
12000.0000 [IU] | ORAL_CAPSULE | Freq: Three times a day (TID) | ORAL | Status: DC
  Administered 2014-12-07 – 2014-12-09 (×8): 12000 [IU] via ORAL
  Filled 2014-12-07 (×11): qty 1

## 2014-12-07 MED ORDER — HEPARIN SODIUM (PORCINE) 5000 UNIT/ML IJ SOLN
5000.0000 [IU] | Freq: Three times a day (TID) | INTRAMUSCULAR | Status: DC
  Administered 2014-12-07 – 2014-12-09 (×8): 5000 [IU] via SUBCUTANEOUS
  Filled 2014-12-07 (×10): qty 1

## 2014-12-07 MED ORDER — PANTOPRAZOLE SODIUM 40 MG PO TBEC
40.0000 mg | DELAYED_RELEASE_TABLET | Freq: Every day | ORAL | Status: DC
  Administered 2014-12-07 – 2014-12-08 (×2): 40 mg via ORAL
  Filled 2014-12-07 (×3): qty 1

## 2014-12-07 MED ORDER — CYANOCOBALAMIN 250 MCG PO TABS
250.0000 ug | ORAL_TABLET | Freq: Every evening | ORAL | Status: DC
  Administered 2014-12-07 – 2014-12-09 (×3): 250 ug via ORAL
  Filled 2014-12-07 (×3): qty 1

## 2014-12-07 MED ORDER — HYDROCODONE-ACETAMINOPHEN 5-325 MG PO TABS: 1.0000 | ORAL_TABLET | ORAL | Status: DC | PRN

## 2014-12-07 NOTE — Progress Notes (Addendum)
Subjective: Doing okay this AM, still w/ arm pain, swelling, edema, and induration around new AVG site. Tender to the touch.   Objective: Vital signs in last 24 hours: Filed Vitals:   12/06/14 2320 12/07/14 0200 12/07/14 0513 12/07/14 1000  BP: 123/83  93/41 90/46  Pulse: 99  97 95  Temp: 100.9 F (38.3 C)  99.5 F (37.5 C) 98.7 F (37.1 C)  TempSrc: Oral  Oral Oral  Resp: 18  20 20   Height:  5\' 1"  (1.549 m)    Weight:      SpO2:   98% 99%   Weight change:   Intake/Output Summary (Last 24 hours) at 12/07/14 1049 Last data filed at 12/07/14 0900  Gross per 24 hour  Intake    275 ml  Output      0 ml  Net    275 ml   Physical Exam: General: Thin appearing AA female, alert, cooperative, NAD. HEENT: PERRL, EOMI. Moist mucus membranes. Poor dentition.  Neck: Full range of motion without pain, supple, no lymphadenopathy or carotid bruits. RIJ tunneled HD catheter.  Lungs: Clear to ascultation bilaterally, normal work of respiration, no wheezes, rales, rhonchi Heart: RRR, no murmurs, gallops, or rubs Abdomen: Soft, non-tender, non-distended, BS + Extremities: No cyanosis, clubbing. LUE edema overlying new AVG site. Erythema, induration, and tenderness to the touch. New AVG w/ good thrill and bruit. Distal radial pulse intact.  Neurologic: Alert & oriented X3, cranial nerves II-XII intact, strength grossly intact, sensation intact to light touch   Lab Results: Basic Metabolic Panel:  Recent Labs Lab 12/06/14 1720 12/07/14 0505 12/07/14 0730  NA 137 135  --   K 3.3* 3.1*  --   CL 100* 98*  --   CO2 28 26  --   GLUCOSE 89 74  --   BUN 9 11  --   CREATININE 3.02* 3.65*  --   CALCIUM 7.3* 6.9*  --   MG  --   --  1.5*   Liver Function Tests:  Recent Labs Lab 12/06/14 1720 12/07/14 0505  AST 47* 33  ALT 13* 9*  ALKPHOS 386* 276*  BILITOT 0.6 0.7  PROT 7.5 6.1*  ALBUMIN 1.4* 1.2*   CBC:  Recent Labs Lab 12/06/14 1720 12/07/14 0505  WBC 12.2* 10.2    NEUTROABS 7.8*  --   HGB 10.9* 9.3*  HCT 35.8* 30.8*  MCV 98.6 96.0  PLT 199 165   Urine Drug Screen: Drugs of Abuse     Component Value Date/Time   LABOPIA POSITIVE* 10/13/2014 0412   LABOPIA NEG 09/18/2011 0936   COCAINSCRNUR NONE DETECTED 10/13/2014 0412   COCAINSCRNUR NEG 09/18/2011 0936   LABBENZ NONE DETECTED 10/13/2014 0412   LABBENZ NEG 09/18/2011 0936   LABBENZ NEG 04/10/2011 1130   AMPHETMU NONE DETECTED 10/13/2014 0412   AMPHETMU NEG 09/18/2011 0936   AMPHETMU NEG 04/10/2011 1130   THCU NONE DETECTED 10/13/2014 0412   THCU NEG 09/18/2011 0936   LABBARB NONE DETECTED 10/13/2014 0412   LABBARB NEG 09/18/2011 4034     Micro Results: Recent Results (from the past 240 hour(s))  MRSA PCR Screening     Status: None   Collection Time: 12/06/14 11:09 PM  Result Value Ref Range Status   MRSA by PCR NEGATIVE NEGATIVE Final    Comment:        The GeneXpert MRSA Assay (FDA approved for NASAL specimens only), is one component of a comprehensive MRSA colonization surveillance program. It  is not intended to diagnose MRSA infection nor to guide or monitor treatment for MRSA infections.    Studies/Results: Dg Chest Port 1 View  12/06/2014   CLINICAL DATA:  Right breast swelling.  Hypotension.  EXAM: PORTABLE CHEST - 1 VIEW  COMPARISON:  10/29/2014 and 10/22/2014  FINDINGS: Double lumen dialysis catheter appears in good position with the tips in the right atrium. Chronic cardiomegaly. No residual left effusion. Small residual right effusion, diminished. Pulmonary vascular congestion has resolved.  IMPRESSION: Resolution of pulmonary vascular congestion and left effusion. Decreased right effusion.   Electronically Signed   By: [name] M.D.   On: 12/06/2014 17:46   Medications: I have reviewed the patient's current medications. Scheduled Meds: .  ceFAZolin (ANCEF) IV  1 g Intravenous Q12H  . feeding supplement (NEPRO CARB STEADY)  237 mL Oral TID WC  . FLUoxetine  10  mg Oral Daily  . folic acid  1 mg Oral Daily  . heparin  5,000 Units Subcutaneous 3 times per day  . lipase/protease/amylase  12,000 Units Oral TID AC  . multivitamin with minerals  1 tablet Oral Daily  . pantoprazole  40 mg Oral Daily  . vitamin B-12  250 mcg Oral QPM   Continuous Infusions:  PRN Meds:.albuterol, morphine injection   Assessment/Plan: 58 y/o F w/ PMHx of HTN, HLD, CHF, alcohol abuse, and ESRD on HD (now MWF), admitted for RUE AVG reaction vs infection.   RUE AVG infection vs Graft Reaction: Significant overlying erythema, tenderness, swelling, and induration on exam. Patient w/ recent AVG placement, now w/ fever, leukocytosis, and skin changes as described. Unlikely to be clot based on presence of good thrill and bruit. Could be infection vs graft reaction to Gore-Tex. Fever has since resolved. Started on Vancomycin + Ancef in the ED. Vascular US performed, not suggestive of  -Seen by vascular surgery, appreciate recs -Continue ABx as above; Vancomycin + Ancef -Repeat CBC in AM -F/u blood cultures -Can give Tylenol, Norco prn for pain if needed.   ESRD on HD: Now MWF HD.  -Renal following, appreciate management  Hypokalemia, hypomagnesemia: Chronic. Most likely related to alcohol abuse. Given K 40 mEq x1 this AM. Will give 1 mg Mag sulfate IV for now.  -Will receive 4K bath w/ HD -Cautious w/ repletion given ESRD.  -Repeat BMP, magnesium in AM  HTN: hypotensive on admission, Continues to be 90's/40's -Hold home BP medications  Malnutrition: Patient underweight 2/2 chronic alcohol abuse. Will continue vitamin and diet supplementation. -Continue feeding supplement, Creon, B12, folate, and multivitamin  Depression: Stable.  -Continue Fluoxetine 10 mg daily  Dispo: Disposition is deferred at this time, awaiting improvement of current medical problems.  Anticipated discharge in approximately 2-3 day(s).   The patient does have a current PCP Otho Bellows, MD)  and does need an [facility] hospital follow-up appointment after discharge.  The patient does not have transportation limitations that hinder transportation to clinic appointments.  .Services Needed at time of discharge: Y = Yes, Blank = No PT:   OT:   RN:   Equipment:   Other:     LOS: 1 day   Corky Sox, MD 12/07/2014, 10:49 AM

## 2014-12-07 NOTE — Progress Notes (Addendum)
[facility]   Subjective  - She is having swelling in the upper left arm s/p AV graft surgery 12/03/2014 by Dr. Kellie Simmering.  Tm 99 admitted to [facility] 12/06/2014 due to pain and swelling in the left arm.  She states she has night sweats, no SOB, no CP.  She dialysis via right IJ catheter.  Her WBC is 10.2 down from 12.2 at admission.  She is currently on Ancef IV Q12.     Objective 93/41 97 99.5 F (37.5 C) (Oral) 20 98%  Intake/Output Summary (Last 24 hours) at 12/07/14 0938 Last data filed at 12/07/14 0600  Gross per 24 hour  Intake    275 ml  Output      0 ml  Net    275 ml    Left upper arm graft with palpable thrill.  Skin warm to touch with min to moderate edema. Incisions well healed Distally left hand N/V/M intact  Assessment/Planning: S/P left AV graft placement 12/03/2014 by Dr. Kellie Simmering Edema is to be expected after this type of surgery, however a low grade temp with min. Increase in WBC could be a reaction to the graft material verse infection.  We will continue observation and IV antibiotics and re access her tomorrow.     Laurence Slate [facility] 12/07/2014 9:38 AM --  Laboratory Lab Results:  Recent Labs  12/06/14 1720 12/07/14 0505  WBC 12.2* 10.2  HGB 10.9* 9.3*  HCT 35.8* 30.8*  PLT 199 165   BMET  Recent Labs  12/06/14 1720 12/07/14 0505  NA 137 135  K 3.3* 3.1*  CL 100* 98*  CO2 28 26  GLUCOSE 89 74  BUN 9 11  CREATININE 3.02* 3.65*  CALCIUM 7.3* 6.9*    COAG Lab Results  Component Value Date   INR 1.67* 10/22/2014   INR 1.86* 10/19/2014   INR 2.13* 10/13/2014   No results found for: PTT    I agree with the above.  I have seen and evaluated the patient.  She is status post left upper arm AV graft on 12/03/2014 by Dr. Kellie Simmering.  She was admitted to the hospital with decreased blood pressure, tachycardia and low-grade fever.  She complains of pain and swelling at the site of her graft placement.  The skin  over top of her graft is warm.  There is edema around the tunnel site.  The incisions are clean.  There is a good thrill within the fistula.  At this time it is unclear whether this is related to infection or a Gore-Tex graft reaction.  I would favor treating her with anti-biotics and arm elevation to see if this resolves.  If it does not, she potentially would require removal of her graft.  Annamarie Major

## 2014-12-07 NOTE — Progress Notes (Signed)
ANTIBIOTIC CONSULT NOTE - INITIAL  Pharmacy Consult for vanc/zosyn Indication: Cellulitis  Allergies  Allergen Reactions  . Amitriptyline Hcl Swelling    In the face.  . Doxycycline Hyclate Itching    Feels like something crawling under her skin    Patient Measurements: Height: 5\' 1"  (154.9 cm) Weight: 86 lb 3.2 oz (39.1 kg) IBW/kg (Calculated) : 47.8 Adjusted Body Weight:   Vital Signs: Temp: 100.2 F (37.9 C) (07/04 1910) Temp Source: Oral (07/04 1910) BP: 102/51 mmHg (07/04 2100) Pulse Rate: 93 (07/04 2100) Intake/Output from previous day: 07/03 0701 - 07/04 0700 In: 275 [I.V.:275] Out: -  Intake/Output from this shift:    Labs:  Recent Labs  12/06/14 1720 12/07/14 0505  WBC 12.2* 10.2  HGB 10.9* 9.3*  PLT 199 165  CREATININE 3.02* 3.65*   Estimated Creatinine Clearance: 10.5 mL/min (by C-G formula based on Cr of 3.65).  Recent Labs  12/07/14 1800  VANCORANDOM 23     Microbiology: Recent Results (from the past 720 hour(s))  Culture, blood (routine x 2)     Status: None (Preliminary result)   Collection Time: 12/06/14  5:20 PM  Result Value Ref Range Status   Specimen Description BLOOD RIGHT FOREARM  Final   Special Requests BOTTLES DRAWN AEROBIC AND ANAEROBIC 4.5CC  Final   Culture NO GROWTH < 24 HOURS  Final   Report Status PENDING  Incomplete  Culture, blood (routine x 2)     Status: None (Preliminary result)   Collection Time: 12/06/14  9:06 PM  Result Value Ref Range Status   Specimen Description BLOOD RIGHT HAND  Final   Special Requests BOTTLES DRAWN AEROBIC ONLY 2CC  Final   Culture NO GROWTH < 24 HOURS  Final   Report Status PENDING  Incomplete  MRSA PCR Screening     Status: None   Collection Time: 12/06/14 11:09 PM  Result Value Ref Range Status   MRSA by PCR NEGATIVE NEGATIVE Final    Comment:        The GeneXpert MRSA Assay (FDA approved for NASAL specimens only), is one component of a comprehensive MRSA  colonization surveillance program. It is not intended to diagnose MRSA infection nor to guide or monitor treatment for MRSA infections.     Medical History: Past Medical History  Diagnosis Date  . Anemia, B12 deficiency   . History of acute pancreatitis   . Right knee pain     No recent imaging on chart  . 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 Dr. Mancel Bale, [facility]  . Hypertriglyceridemia   . GERD (gastroesophageal reflux disease)   . Subdural hematoma 02/2008    Likely 2/2 trauma from seizure from EtOH withdrawal, chronic in nature, sees Dr. Jerene Bears. Most recent CT head 10/2009 showing stable but persistent hematoma without mass effect.  . History of seizure disorder     Likely 2/2 alcohol abuse  . Hypocalcemia   . Hypomagnesemia   . Failure to thrive in childhood     Unclear etiology  . HTN (hypertension)   . Thrombocytopenia   . Hepatomegaly     On exam  . Joint pain   . Alcohol abuse   . Vitamin D deficiency   . Pancreatitis   . Insomnia   . Hyperlipidemia   . Pernicious anemia   . Macrocytic anemia   . 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 Depression   .  Fx humeral neck 04/17/2011    Transverse fracture- minimally displaced- managed as outpatient   . ABNORMAL PAP SMEAR, LGSIL 07/23/2008    Annotation: HPV positive CIN I Dr. Mancel Bale, [facility] Qualifier: Diagnosis of  By: Oretha Ellis    . Pneumonia 05/20/2012  . Arthritis     "shoulders" (08/15/2013)  . 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  Marland Kitchen Chronic diastolic CHF (congestive heart failure)   . Hypomagnesemia   . Seizures     "don't know when/why I had them; daughter was always there w/me"  . On home oxygen therapy     "3L; mostly at night" (06/19/2014)  . Shortness of breath dyspnea   . Pleural effusion     Medications:  Scheduled:  . [START ON 12/08/2014]  ceFAZolin  (ANCEF) IV  1 g Intravenous Q24H  . darbepoetin (ARANESP) injection - DIALYSIS  100 mcg Intravenous Q Mon-HD  . feeding supplement (NEPRO CARB STEADY)  237 mL Oral TID WC  . FLUoxetine  10 mg Oral Daily  . folic acid  1 mg Oral Daily  . heparin  5,000 Units Subcutaneous 3 times per day  . lipase/protease/amylase  12,000 Units Oral TID AC  . multivitamin  1 tablet Oral QHS  . pantoprazole  40 mg Oral Daily  . vancomycin  500 mg Intravenous Q M,W,F-HD  . 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 Recently she got an AV graft placed. Now presenting with arm swelling and pain. Currently on vanc/ancef right now. She got a load yesterday and a preHD vanc level came back at 23 tonight so it's therapeutic. Plan is to do MWF HD schedule. Will give dose after HD today.   Goal of Therapy:  PreHD vanc = 15-25  Plan:   Start vanc 500mg  IV qMWF  Onnie Boer, PharmD Pager: [phone] 12/07/2014 9:32 PM

## 2014-12-07 NOTE — Progress Notes (Signed)
Internal Medicine Attending  Date: 12/07/2014  Patient name: [name] [name] Medical record number: [id number] Date of birth: April 11, 1956 Age: 59 y.o. Gender: female  I saw and evaluated the patient. I reviewed the resident's note by Dr. Ronnald Ramp and I agree with the resident's findings and plans as documented in his progress note.  Please see my H&P dated 12/07/2014 and attached to Dr. Tanna Furry H&P dated 12/06/2014 for the specifics of my evaluation, assessment, and plan from earlier today.

## 2014-12-07 NOTE — Consult Note (Addendum)
Nephrology Consultation Requesting Service/MD Internal Medicine Teaching Service Indication for Consultation:  Management of ESRD/hemodialysis; anemia, hypertension/volume and secondary hyperparathyroidism  HPI: [name] [name] is a 59 y.o. female who presented to the ED last night for evaluation of L arm pain and swelling which began Saturday- she had LUA AVG placed 6/30 by Dr Kellie Simmering. She was discharged Thursday feeling well, she had HD Saturday and after HD began having pain and swelling in L arm. Yesterday she developed a fever so presented to the ED for evaluation, she has been seen by [facility]VS and started on antibiotics.  Will arrange HD while admitted. She reported fevers and arm edema as noted, associated with drenching night sweats. Pt was on TTS HD at [facility] while receiving rehab in a facility there, due to transfer to [facility] on MWF schedule this week.  Past Medical History  Diagnosis Date  . Anemia, B12 deficiency   . History of acute pancreatitis   . Right knee pain     No recent imaging on chart  . 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 Dr. Mancel Bale, [facility]  . Hypertriglyceridemia   . GERD (gastroesophageal reflux disease)   . Subdural hematoma 02/2008    Likely 2/2 trauma from seizure from EtOH withdrawal, chronic in nature, sees Dr. Jerene Bears. Most recent CT head 10/2009 showing stable but persistent hematoma without mass effect.  . History of seizure disorder     Likely 2/2 alcohol abuse  . Hypocalcemia   . Hypomagnesemia   . Failure to thrive in childhood     Unclear etiology  . HTN (hypertension)   . Thrombocytopenia   . Hepatomegaly     On exam  . Joint pain   . Alcohol abuse   . Vitamin D deficiency   . Pancreatitis   . Insomnia   . Hyperlipidemia   . Pernicious anemia   . Macrocytic anemia   . 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 Depression   . Fx humeral neck 04/17/2011     Transverse fracture- minimally displaced- managed as outpatient   . ABNORMAL PAP SMEAR, LGSIL 07/23/2008    Annotation: HPV positive CIN I Dr. Mancel Bale, [facility] Qualifier: Diagnosis of  By: Oretha Ellis    . Pneumonia 05/20/2012  . Arthritis     "shoulders" (08/15/2013)  . 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  Marland Kitchen Chronic diastolic CHF (congestive heart failure)   . Hypomagnesemia   . Seizures     "don't know when/why I had them; daughter was always there w/me"  . On home oxygen therapy     "3L; mostly at night" (06/19/2014)  . Shortness of breath dyspnea   . Pleural effusion    Past Surgical History  Procedure Laterality Date  . Cesarean section  1983  . Esophagogastroduodenoscopy  07/11/2011    Procedure: ESOPHAGOGASTRODUODENOSCOPY (EGD);  Surgeon: Beryle Beams, MD;  Location: [facility];  Service: Endoscopy;  Laterality: N/A;  . Colonoscopy  07/11/2011    Procedure: COLONOSCOPY;  Surgeon: Beryle Beams, MD;  Location: [facility] ENDOSCOPY;  Service: Endoscopy;  Laterality: N/A;  . Eye surgery Left     "trauma"  . Right colectomy  08/28/2011  . Esophagogastroduodenoscopy N/A 12/01/2012    Procedure: ESOPHAGOGASTRODUODENOSCOPY (EGD);  Surgeon: Irene Shipper, MD;  Location: [facility];  Service:  Endoscopy;  Laterality: N/A;  . Colonoscopy with esophagogastroduodenoscopy (egd) Left 08/21/2013    Procedure: COLONOSCOPY WITH ESOPHAGOGASTRODUODENOSCOPY (EGD);  Surgeon: Beryle Beams, MD;  Location: [facility] ENDOSCOPY;  Service: Endoscopy;  Laterality: Left;  . Subxyphoid pericardial window N/A 12/04/2013    Procedure: SUBXYPHOID PERICARDIAL WINDOW WITH TEE;  Surgeon: Ivin Poot, MD;  Location: [place];  Service: Thoracic;  Laterality: N/A;  . Intraoperative transesophageal echocardiogram N/A 12/04/2013    Procedure: INTRAOPERATIVE TRANSESOPHAGEAL ECHOCARDIOGRAM;  Surgeon: Ivin Poot, MD;  Location: [place];  Service: Open Heart Surgery;   Laterality: N/A;  . Exchange of a dialysis catheter Left 10/22/2014    Procedure: EXCHANGE OF A DIALYSIS CATHETER;  Surgeon: Conrad [place], MD;  Location: [place];  Service: Vascular;  Laterality: Left;  . Av fistula placement Left 12/03/2014    Procedure: INSERTION OF ARTERIOVENOUS (AV) GORE-TEX GRAFT ARM;  Surgeon: Mal Misty, MD;  Location: [facility] OR;  Service: Vascular;  Laterality: Left;   Family History  Problem Relation Age of Onset  . Cancer Mother     Died from stomach cancer and "flesh eating rash  . Heart failure Father     Died in 37s from an MI  . Alcohol abuse Sister     Twin sister drinks a lot, as did both her parents and brothers  . Stroke Brother     Has 7 brothers, 1 with CVA  . Lupus Mother    Social History:  reports that she quit smoking about 4 years ago. Her smoking use included Cigarettes. She has a 20 pack-year smoking history. She has never used smokeless tobacco. She reports that she drinks alcohol. She reports that she uses illicit drugs (Marijuana and Cocaine). Allergies  Allergen Reactions  . Amitriptyline Hcl Swelling    In the face.  . Doxycycline Hyclate Itching    Feels like something crawling under her skin   Prior to Admission medications   Medication Sig Start Date End Date Taking? Authorizing Provider  albuterol (PROAIR HFA) 108 (90 BASE) MCG/ACT inhaler Inhale 1-2 puffs into the lungs every 6 (six) hours as needed for wheezing or shortness of breath. 01/14/14  Yes Jones Bales, MD  cetirizine (ZYRTEC) 10 MG tablet Take 1 tablet (10 mg total) by mouth daily as needed for allergies. 09/20/14  Yes Otho Bellows, MD  CREON 12000 UNITS CPEP capsule Take 12,000 Units by mouth 3 (three) times daily before meals.  09/29/14  Yes Historical Provider, MD  ENSURE (ENSURE) Take 237 mLs by mouth 2 (two) times daily between meals.   Yes Historical Provider, MD  FLUoxetine (PROZAC) 10 MG capsule Take 1 capsule (10 mg total) by mouth daily. For depression  03/26/14  Yes Otho Bellows, MD  folic acid (FOLVITE) 1 MG tablet Take 1 tablet (1 mg total) by mouth daily. For folic acid replacement 09/20/14  Yes Otho Bellows, MD  Lactobacillus (ACIDOPHILUS PROBIOTIC PO) Take 2 mg by mouth 3 (three) times daily. Take 2 (two) tablets by mouth three times daily   Yes Historical Provider, MD  lisinopril (PRINIVIL,ZESTRIL) 2.5 MG tablet Take 2.5 mg by mouth daily.   Yes Historical Provider, MD  mirtazapine (REMERON) 7.5 MG tablet Take 7.5 mg by mouth at bedtime.   Yes Historical Provider, MD  Multiple Vitamin (MULTIVITAMIN WITH MINERALS) TABS tablet Take 1 tablet by mouth daily. For vitamin replacement   Yes Otho Bellows, MD  Nutritional Supplements (FEEDING SUPPLEMENT, NEPRO CARB STEADY,) LIQD  Take 237 mLs by mouth 3 (three) times daily with meals. 10/29/14  Yes Langley Gauss Moding, MD  omeprazole (PRILOSEC) 40 MG capsule Take 1 capsule (40 mg total) by mouth daily. 03/26/14  Yes Otho Bellows, MD  oxyCODONE (OXY IR/ROXICODONE) 5 MG immediate release tablet Take 1 tablet (5 mg total) by mouth every 6 (six) hours as needed for moderate pain or severe pain. 12/03/14  Yes Samantha J Rhyne, PA-C  ramelteon (ROZEREM) 8 MG tablet Take 1 tablet (8 mg total) by mouth at bedtime. 10/29/14  Yes Langley Gauss Moding, MD  thiamine (VITAMIN B-1) 100 MG tablet Take 1 tablet (100 mg total) by mouth daily. For low thiamine 09/22/14  Yes Otho Bellows, MD  vitamin B-12 (CYANOCOBALAMIN) 250 MCG tablet Take 1 tablet (250 mcg total) by mouth every evening. 04/10/13  Yes Otho Bellows, MD  loperamide (IMODIUM) 2 MG capsule Take 1 capsule (2 mg total) by mouth as needed for diarrhea or loose stools. 09/23/14   Luan Moore, MD   Current Facility-Administered Medications  Medication Dose Route Frequency Provider Last Rate Last Dose  . albuterol (PROVENTIL) (2.5 MG/3ML) 0.083% nebulizer solution 3 mL  3 mL Inhalation Q6H PRN Tasrif Ahmed, MD      . ceFAZolin (ANCEF) IVPB 1 g/50 mL premix   1 g Intravenous Q12H Franky Macho, RPH   1 g at 12/07/14 1044  . feeding supplement (NEPRO CARB STEADY) liquid 237 mL  237 mL Oral TID WC Tasrif Ahmed, MD   237 mL at 12/07/14 0800  . FLUoxetine (PROZAC) capsule 10 mg  10 mg Oral Daily Tasrif Ahmed, MD   10 mg at 12/07/14 1045  . folic acid (FOLVITE) tablet 1 mg  1 mg Oral Daily Tasrif Ahmed, MD   1 mg at 12/07/14 1045  . heparin injection 5,000 Units  5,000 Units Subcutaneous 3 times per day Dellia Nims, MD   5,000 Units at 12/07/14 0541  . lipase/protease/amylase (CREON) capsule 12,000 Units  12,000 Units Oral TID AC Tasrif Ahmed, MD   12,000 Units at 12/07/14 1045  . morphine 2 MG/ML injection 1 mg  1 mg Intravenous Q3H PRN Dellia Nims, MD   1 mg at 12/07/14 0541  . multivitamin with minerals tablet 1 tablet  1 tablet Oral Daily Tasrif Ahmed, MD   1 tablet at 12/07/14 1045  . pantoprazole (PROTONIX) EC tablet 40 mg  40 mg Oral Daily Tasrif Ahmed, MD   40 mg at 12/07/14 1043  . vitamin B-12 (CYANOCOBALAMIN) tablet 250 mcg  250 mcg Oral QPM Tasrif Ahmed, MD        Review of Systems: Reports feeling well except for L arm pain and swelling/ Fever last night Reports good appetite but gets full quickly- denies abd pain/ one loose stool yesterday.  Denies SOB when on 02- home 02 2L Denies chest pain/ LE edema  Physical Exam: Filed Vitals:   12/06/14 2320 12/07/14 0200 12/07/14 0513 12/07/14 1000  BP: 123/83  93/41 90/46  Pulse: 99  97 95  Temp: 100.9 F (38.3 C)  99.5 F (37.5 C) 98.7 F (37.1 C)  TempSrc: Oral  Oral Oral  Resp: 18  20 20   Height:  5\' 1"  (1.549 m)    Weight:      SpO2:   98% 99%   Visit vitals BP 90/46 mmHg  Pulse 95  Temp(Src) 98.7 F (37.1 C) (Oral)  Resp 20  Ht 5\' 1"  (1.549 m)  Wt 39.463  kg (87 lb)  BMI 16.45 kg/m2  SpO2 99%   General: thin and frail in no acute distress. Head: Normocephalic, atraumatic, sclera non-icteric, mucus membranes are moist Neck: Supple. JVD not elevated. Lungs: Clear  bilaterally to auscultation without wheezes, rales, or rhonchi. Breathing is unlabored. Dry cough Heart: RRR with S1 S2. No murmurs, rubs, or gallops appreciated. Abdomen: Soft, non-tender, non-distended with normoactive bowel sounds. No rebound/guarding. No obvious abdominal masses. M-S:  Strength and tone appear normal for age. Lower extremities:without edema or ischemic changes, no open wounds. Dry skin Neuro: Alert and oriented X 3. Moves all extremities spontaneously. Psych:  Slow to respond to some questions.  Dialysis Access:R IJ cath dressing dry, tunnel non-tender   L AVG (placed 6/30) +b/t surrounding erythema/warmth/edema- incision clean with no drainage  Dialysis Orders:  MWF [place] 4hr   40 kgs   2K/2.25Ca   R IJ cath   Heparin 2000u  No meds  Labs: Basic Metabolic Panel:  Recent Labs Lab 12/03/14 0736 12/06/14 1720 12/07/14 0505  NA 140 137 135  K 3.2* 3.3* 3.1*  CL  --  100* 98*  CO2  --  28 26  GLUCOSE 86 89 74  BUN  --  9 11  CREATININE  --  3.02* 3.65*  CALCIUM  --  7.3* 6.9*     Recent Labs Lab 12/06/14 1720 12/07/14 0505  AST 47* 33  ALT 13* 9*  ALKPHOS 386* 276*  BILITOT 0.6 0.7  PROT 7.5 6.1*  ALBUMIN 1.4* 1.2*       Recent Labs Lab 12/03/14 0736 12/06/14 1720 12/07/14 0505  WBC  --  12.2* 10.2  NEUTROABS  --  7.8*  --   HGB 11.9* 10.9* 9.3*  HCT 35.0* 35.8* 30.8*  MCV  --  98.6 96.0  PLT  --  199 165    Studies/Results: Dg Chest Port 1 View  12/06/2014   CLINICAL DATA:  Right breast swelling.  Hypotension.  EXAM: PORTABLE CHEST - 1 VIEW  COMPARISON:  10/29/2014 and 10/22/2014  FINDINGS: Double lumen dialysis catheter appears in good position with the tips in the right atrium. Chronic cardiomegaly. No residual left effusion. Small residual right effusion, diminished. Pulmonary vascular congestion has resolved.  IMPRESSION: Resolution of pulmonary vascular congestion and left effusion. Decreased right effusion.   Electronically Signed    By: [name] M.D.   On: 12/06/2014 17:46    Assessment/Plan: 1.  Fevers/night sweats/leukocytosis - L AVG infection vs gortex reaction. Has [facility] which externally looks fine.  Minimal elevation in WBC. - VVS following. Vanc and Ancef started.Received 1 dose vanco. Getting Q12H Ancef excessive for ESRD - change to 1 gm every 24 hours.   Blood cultures pending. WBC 10.2- down from 12.2- tmax 100.9 last night. Duplex pending 2.  ESRD -  MWF [place], just transferred from [place], was at [facility] and rehab since the beginning of June- recently started HD end of May while admitted for volume overload. K+ 3.1 use 4K bath today 3.  Hypertension/volume  - hypotension- no outpt BP meds. under edw. Post wt 38.6kgs- small gains- no more than 1.5L at outpt HD- cont to lower edw as tolerates 4.  Anemia  - hgb 9.3- was 10.2 outpt- start ESA- received Fe bolus for tsat 21 5.  Metabolic bone disease -last phos 4.5 and PTH 371. Corrected Ca+ 9.1. No binders 6.  Nutrition - alb 1.2 Malnutrition-renal diet vitamin and supplements- reports eats small meals and wt  loss.  7. History of alcohol abuse 8. Hypokalemia - rec'd po dose of KDur today 40 mEq. 4K bath with HD  Shelle Iron, NP [facility] [phone] 12/07/2014, 12:03 PM   I have seen and examined this patient and agree with plan with the highlighted additions. AVG infection vs goretex reaction. Doubt related to [facility]. Arm edema not unusual post AVG. On ATB's. Ancef adjusted for ESRD. Cultures all pending. For duplex to r/o fluid collection. For HD today on her new MWF schedule.  Meryn Sarracino B,MD 12/07/2014 2:11 PM

## 2014-12-08 ENCOUNTER — Telehealth: Payer: Self-pay | Admitting: Internal Medicine

## 2014-12-08 ENCOUNTER — Encounter: Payer: Self-pay | Admitting: *Deleted

## 2014-12-08 DIAGNOSIS — E876 Hypokalemia: Secondary | ICD-10-CM

## 2014-12-08 DIAGNOSIS — F419 Anxiety disorder, unspecified: Secondary | ICD-10-CM

## 2014-12-08 LAB — RENAL FUNCTION PANEL
Albumin: 1.2 g/dL — ABNORMAL LOW (ref 3.5–5.0)
Anion gap: 7 (ref 5–15)
BUN: 7 mg/dL (ref 6–20)
CHLORIDE: 101 mmol/L (ref 101–111)
CO2: 25 mmol/L (ref 22–32)
CREATININE: 2.75 mg/dL — AB (ref 0.44–1.00)
Calcium: 7.1 mg/dL — ABNORMAL LOW (ref 8.9–10.3)
GFR calc Af Amer: 21 mL/min — ABNORMAL LOW (ref >60)
GFR calc non Af Amer: 18 mL/min — ABNORMAL LOW (ref >60)
GLUCOSE: 100 mg/dL — AB (ref 65–99)
Phosphorus: 2.9 mg/dL (ref 2.5–4.6)
Potassium: 4.4 mmol/L (ref 3.5–5.1)
Sodium: 133 mmol/L — ABNORMAL LOW (ref 135–145)

## 2014-12-08 LAB — CBC
HCT: 33 % — ABNORMAL LOW (ref 36.0–46.0)
Hemoglobin: 10.3 g/dL — ABNORMAL LOW (ref 12.0–15.0)
MCH: 29.8 pg (ref 26.0–34.0)
MCHC: 31.2 g/dL (ref 30.0–36.0)
MCV: 95.4 fL (ref 78.0–100.0)
Platelets: 125 10*3/uL — ABNORMAL LOW (ref 150–400)
RBC: 3.46 MIL/uL — AB (ref 3.87–5.11)
RDW: 18.1 % — ABNORMAL HIGH (ref 11.5–15.5)
WBC: 10.9 10*3/uL — ABNORMAL HIGH (ref 4.0–10.5)

## 2014-12-08 LAB — GLUCOSE, CAPILLARY: GLUCOSE-CAPILLARY: 141 mg/dL — AB (ref 65–99)

## 2014-12-08 MED ORDER — HEPARIN SODIUM (PORCINE) 1000 UNIT/ML DIALYSIS: 1000.0000 [IU] | INTRAMUSCULAR | Status: DC | PRN

## 2014-12-08 MED ORDER — NEPRO/CARBSTEADY PO LIQD: 237.0000 mL | ORAL | Status: DC | PRN

## 2014-12-08 MED ORDER — ALTEPLASE 2 MG IJ SOLR
2.0000 mg | Freq: Once | INTRAMUSCULAR | Status: AC | PRN
  Filled 2014-12-08: qty 2

## 2014-12-08 MED ORDER — PENTAFLUOROPROP-TETRAFLUOROETH EX AERO: 1.0000 "application " | INHALATION_SPRAY | CUTANEOUS | Status: DC | PRN

## 2014-12-08 MED ORDER — SODIUM CHLORIDE 0.9 % IV SOLN: 100.0000 mL | INTRAVENOUS | Status: DC | PRN

## 2014-12-08 MED ORDER — LIDOCAINE-PRILOCAINE 2.5-2.5 % EX CREA
1.0000 "application " | TOPICAL_CREAM | CUTANEOUS | Status: DC | PRN
  Filled 2014-12-08: qty 5

## 2014-12-08 MED ORDER — HEPARIN SODIUM (PORCINE) 1000 UNIT/ML DIALYSIS: 2000.0000 [IU] | Freq: Once | INTRAMUSCULAR | Status: DC

## 2014-12-08 MED ORDER — DARBEPOETIN ALFA 100 MCG/0.5ML IJ SOSY
100.0000 ug | PREFILLED_SYRINGE | INTRAMUSCULAR | Status: DC
  Administered 2014-12-09: 100 ug via INTRAVENOUS
  Filled 2014-12-08: qty 0.5

## 2014-12-08 MED ORDER — LIDOCAINE HCL (PF) 1 % IJ SOLN: 5.0000 mL | INTRAMUSCULAR | Status: DC | PRN

## 2014-12-08 MED ORDER — LIDOCAINE-PRILOCAINE 2.5-2.5 % EX CREA: 1.0000 "application " | TOPICAL_CREAM | CUTANEOUS | Status: DC | PRN

## 2014-12-08 MED ORDER — ALTEPLASE 2 MG IJ SOLR: 2.0000 mg | Freq: Once | INTRAMUSCULAR | Status: DC | PRN

## 2014-12-08 NOTE — Progress Notes (Signed)
Subjective: Arm improved this morning, overlying edema and mild tenderness, swelling and erythema improved.   Objective: Vital signs in last 24 hours: Filed Vitals:   12/07/14 2155 12/07/14 2210 12/07/14 2237 12/08/14 0536  BP: 102/45 115/57 99/53 71/45   Pulse: 94 94 83 81  Temp: 97.3 F (36.3 C)  98.1 F (36.7 C) 97.8 F (36.6 C)  TempSrc: Oral  Oral Oral  Resp: 18 20 16 18   Height:      Weight:  84 lb 14 oz (38.5 kg)    SpO2: 96%   99%   Weight change: -12.8 oz (-0.363 kg)  Intake/Output Summary (Last 24 hours) at 12/08/14 7829 Last data filed at 12/08/14 5621  Gross per 24 hour  Intake    600 ml  Output    432 ml  Net    168 ml   Physical Exam: General: Thin appearing AA female, alert, cooperative, NAD. HEENT: PERRL, EOMI. Moist mucus membranes. Poor dentition.  Neck: Full range of motion without pain, supple, no lymphadenopathy or carotid bruits. RIJ tunneled HD catheter.  Lungs: Clear to ascultation bilaterally, normal work of respiration, no wheezes, rales, rhonchi Heart: RRR, no murmurs, gallops, or rubs Abdomen: Soft, non-tender, non-distended, BS + Extremities: No cyanosis, clubbing. LUE edema overlying new AVG site. Mild induration and tenderness to the touch. New AVG w/ good thrill and bruit. Distal radial pulse intact.  Neurologic: Alert & oriented X3, cranial nerves II-XII intact, strength grossly intact, sensation intact to light touch   Lab Results: Basic Metabolic Panel:  Recent Labs Lab 12/06/14 1720 12/07/14 0505 12/07/14 0730  NA 137 135  --   K 3.3* 3.1*  --   CL 100* 98*  --   CO2 28 26  --   GLUCOSE 89 74  --   BUN 9 11  --   CREATININE 3.02* 3.65*  --   CALCIUM 7.3* 6.9*  --   MG  --   --  1.5*   Liver Function Tests:  Recent Labs Lab 12/06/14 1720 12/07/14 0505  AST 47* 33  ALT 13* 9*  ALKPHOS 386* 276*  BILITOT 0.6 0.7  PROT 7.5 6.1*  ALBUMIN 1.4* 1.2*   CBC:  Recent Labs Lab 12/06/14 1720 12/07/14 0505  WBC  12.2* 10.2  NEUTROABS 7.8*  --   HGB 10.9* 9.3*  HCT 35.8* 30.8*  MCV 98.6 96.0  PLT 199 165   Urine Drug Screen: Drugs of Abuse     Component Value Date/Time   LABOPIA POSITIVE* 10/13/2014 0412   LABOPIA NEG 09/18/2011 0936   COCAINSCRNUR NONE DETECTED 10/13/2014 0412   COCAINSCRNUR NEG 09/18/2011 0936   LABBENZ NONE DETECTED 10/13/2014 0412   LABBENZ NEG 09/18/2011 0936   LABBENZ NEG 04/10/2011 1130   AMPHETMU NONE DETECTED 10/13/2014 0412   AMPHETMU NEG 09/18/2011 0936   AMPHETMU NEG 04/10/2011 1130   THCU NONE DETECTED 10/13/2014 0412   THCU NEG 09/18/2011 0936   LABBARB NONE DETECTED 10/13/2014 0412   LABBARB NEG 09/18/2011 3086     Micro Results: Recent Results (from the past 240 hour(s))  Culture, blood (routine x 2)     Status: None (Preliminary result)   Collection Time: 12/06/14  5:20 PM  Result Value Ref Range Status   Specimen Description BLOOD RIGHT FOREARM  Final   Special Requests BOTTLES DRAWN AEROBIC AND ANAEROBIC 4.5CC  Final   Culture NO GROWTH < 24 HOURS  Final   Report Status PENDING  Incomplete  Culture,  blood (routine x 2)     Status: None (Preliminary result)   Collection Time: 12/06/14  9:06 PM  Result Value Ref Range Status   Specimen Description BLOOD RIGHT HAND  Final   Special Requests BOTTLES DRAWN AEROBIC ONLY 2CC  Final   Culture NO GROWTH < 24 HOURS  Final   Report Status PENDING  Incomplete  MRSA PCR Screening     Status: None   Collection Time: 12/06/14 11:09 PM  Result Value Ref Range Status   MRSA by PCR NEGATIVE NEGATIVE Final    Comment:        The GeneXpert MRSA Assay (FDA approved for NASAL specimens only), is one component of a comprehensive MRSA colonization surveillance program. It is not intended to diagnose MRSA infection nor to guide or monitor treatment for MRSA infections.    Studies/Results: Dg Chest Port 1 View  12/06/2014   CLINICAL DATA:  Right breast swelling.  Hypotension.  EXAM: PORTABLE CHEST - 1 VIEW   COMPARISON:  10/29/2014 and 10/22/2014  FINDINGS: Double lumen dialysis catheter appears in good position with the tips in the right atrium. Chronic cardiomegaly. No residual left effusion. Small residual right effusion, diminished. Pulmonary vascular congestion has resolved.  IMPRESSION: Resolution of pulmonary vascular congestion and left effusion. Decreased right effusion.   Electronically Signed   By: [name] M.D.   On: 12/06/2014 17:46   Medications: I have reviewed the patient's current medications. Scheduled Meds: .  ceFAZolin (ANCEF) IV  1 g Intravenous Q24H  . [START ON 12/09/2014] darbepoetin (ARANESP) injection - DIALYSIS  100 mcg Intravenous Q Wed-HD  . feeding supplement (NEPRO CARB STEADY)  237 mL Oral TID WC  . FLUoxetine  10 mg Oral Daily  . folic acid  1 mg Oral Daily  . heparin  5,000 Units Subcutaneous 3 times per day  . lipase/protease/amylase  12,000 Units Oral TID AC  . multivitamin  1 tablet Oral QHS  . pantoprazole  40 mg Oral Daily  . vancomycin  500 mg Intravenous Q M,W,F-HD  . vitamin B-12  250 mcg Oral QPM   Continuous Infusions:  PRN Meds:.acetaminophen, albuterol, camphor-menthol   Assessment/Plan: 59 y/o F w/ PMHx of HTN, HLD, CHF, alcohol abuse, and ESRD on HD (now MWF), admitted for RUE AVG reaction vs infection.   RUE AVG infection vs Graft Reaction: Significant improvement today on antibiotics.  -Seen by vascular surgery, appreciate recs. Do not suspect graft infection.  -Continue Ancef, will discontinue Vancomycin. Transition to Keflex in AM.  -Repeat CBC in AM -F/u blood cultures -Can give Tylenol, Norco prn for pain if needed.   ESRD on HD: MWF HD.  -Renal following, appreciate management  Hypokalemia, hypomagnesemia: Stable, chronic. Most likely related to alcohol abuse.  -Manage w/ HD bath -Repeat BMP, magnesium in AM  HTN: Stable, borderline hypotensive.  -Hold home BP medications  Malnutrition: Patient underweight 2/2 chronic  alcohol abuse. Will continue vitamin and diet supplementation. -Continue feeding supplement, Creon, B12, folate, and multivitamin  Depression: Stable.  -Continue Fluoxetine 10 mg daily  Dispo: Disposition is deferred at this time, awaiting improvement of current medical problems.  Anticipated discharge in approximately 1-2 day(s).   The patient does have a current PCP Otho Bellows, MD) and does need an [facility] hospital follow-up appointment after discharge.  The patient does not have transportation limitations that hinder transportation to clinic appointments.  .Services Needed at time of discharge: Y = Yes, Blank = No PT:   OT:  RN:   Equipment:   Other:     LOS: 2 days   Corky Sox, MD 12/08/2014, 7:28 AM

## 2014-12-08 NOTE — Telephone Encounter (Signed)
Call from [place] with Alvis Lemmings, [place] was last seen by PT on Sunday and was admitted to hospital.  No order needed at this time.  Will wait until hospital d/c

## 2014-12-08 NOTE — Progress Notes (Signed)
Patient seen and examined. Case d/w residents in detail. I agree with findings and plan as documented in Dr. Ronnald Ramp' note.   Patient is improving with decreased edema and tenderness over graft site. Will c/w IV abx for now and transition to PO Keflex in AM. Will d/c vancomycin today. Vascular follow up noted- unlikely graft infection. Will c/w HD for now. Anticipate discharge in 24-48 hours.

## 2014-12-08 NOTE — Telephone Encounter (Signed)
Morey Hummingbird is [facility] back. Stays that she will more than likely need to just get new orders once the patient is discharged from the hospital now that she is admitted.

## 2014-12-08 NOTE — Care Management (Signed)
Important Message  Patient Details  Name: [name] [name] MRN: [id number] Date of Birth: 1956/01/13   Medicare Important Message Given:  Yes-second notification given    Loann Quill 12/08/2014, 11:10 AM

## 2014-12-08 NOTE — Telephone Encounter (Signed)
Rec'd call from [facility] requesting order for PT 2 week for 3 more weeks.

## 2014-12-08 NOTE — Progress Notes (Signed)
[facility] Rounding Note Subjective:    Pain in arm is a little better this AM Had to abbreviate HD treatment last night due to another emergency so did not get full treatment  Objective Filed Vitals:   12/07/14 2155 12/07/14 2210 12/07/14 2237 12/08/14 0536  BP: 102/45 115/57 99/53 71/45   Pulse: 94 94 83 81  Temp: 97.3 F (36.3 C)  98.1 F (36.7 C) 97.8 F (36.6 C)  TempSrc: Oral  Oral Oral  Resp: 18 20 16 18   Height:      Weight:  38.5 kg (84 lb 14 oz)    SpO2: 96%   99%   Physical Exam General: alert and oriented. No acute distress - sitting up in the chair Heart: Regular S1S2 No S3 Lungs: CTA, unlabored Abdomen: soft, nontender +BS Extremities: no LE edema.  Dialysis Access: R  IJ cath.  L AVG- placed 6/30. +b/t edema improving -not especially tender; slight increase warmth  Dialysis Orders: MWF [facility] 4hr 40 kgs 2K/2.25Ca R IJ cath Heparin 2000u  No meds  Assessment/Plan: 1. Fevers/night sweats/leukocytosis - L AVG infection vs gortex reaction. - VVS following. Vanc and Ancef started. Blood cultures pending. WBC 10.2- down from 12.2- tmax 100.2 last night. Duplex pending 2. ESRD - MWF [place], just transferred from [place], was at [facility] and rehab since the beginning of June- recently started HD end of May while admitted for volume overload.- used 4K bath yesterday 3. Hypertension/volume - hypotension- no outpt BP meds. under edw. Post wt 38.5kgs- small gains- no more than 1.5L at outpt HD- cont to lower edw as tolerates BP will be prohibitive of further decrease 4. Anemia - hgb 9.3- was 10.2 outpt- start ESA- received Fe bolus for tsat 21 5. Metabolic bone disease -last phos 4.5 and PTH 371. Corrected Ca+ 9.1. No binders 6. Nutrition - alb 1.2 Malnutrition-renal diet vitamin and supplements- reports eats small meals and wt loss.  7. History of alcohol abuse 8. Hypokalemia- follow labs. Use 4K bath as needed.  Shelle Iron,  NP [facility] [phone] 12/08/2014,9:15 AM  LOS: 2 days   I have seen and examined this patient and agree with plan as outlined in the above note with highlighted additions. Marshal Eskew B,MD 12/08/2014 12:09 PM  Additional Objective Labs: Basic Metabolic Panel:  Recent Labs Lab 12/03/14 0736 12/06/14 1720 12/07/14 0505  NA 140 137 135  K 3.2* 3.3* 3.1*  CL  --  100* 98*  CO2  --  28 26  GLUCOSE 86 89 74  BUN  --  9 11  CREATININE  --  3.02* 3.65*  CALCIUM  --  7.3* 6.9*   Liver Function Tests:  Recent Labs Lab 12/06/14 1720 12/07/14 0505  AST 47* 33  ALT 13* 9*  ALKPHOS 386* 276*  BILITOT 0.6 0.7  PROT 7.5 6.1*  ALBUMIN 1.4* 1.2*     Recent Labs Lab 12/03/14 0736 12/06/14 1720 12/07/14 0505  WBC  --  12.2* 10.2  NEUTROABS  --  7.8*  --   HGB 11.9* 10.9* 9.3*  HCT 35.0* 35.8* 30.8*  MCV  --  98.6 96.0  PLT  --  199 165   Blood Culture    Component Value Date/Time   SDES BLOOD RIGHT HAND 12/06/2014 2106   SPECREQUEST BOTTLES DRAWN AEROBIC ONLY 2CC 12/06/2014 2106   CULT NO GROWTH < 24 HOURS 12/06/2014 2106   REPTSTATUS PENDING 12/06/2014 2106    Studies/Results: Dg Chest Port 1  View  12/06/2014   CLINICAL DATA:  Right breast swelling.  Hypotension.  EXAM: PORTABLE CHEST - 1 VIEW  COMPARISON:  10/29/2014 and 10/22/2014  FINDINGS: Double lumen dialysis catheter appears in good position with the tips in the right atrium. Chronic cardiomegaly. No residual left effusion. Small residual right effusion, diminished. Pulmonary vascular congestion has resolved.  IMPRESSION: Resolution of pulmonary vascular congestion and left effusion. Decreased right effusion.   Electronically Signed   By: [name] M.D.   On: 12/06/2014 17:46   Medications:   .  ceFAZolin (ANCEF) IV  1 g Intravenous Q24H  . [START ON 12/09/2014] darbepoetin (ARANESP) injection - DIALYSIS  100 mcg Intravenous Q Wed-HD  . feeding supplement (NEPRO CARB STEADY)  237 mL  Oral TID WC  . FLUoxetine  10 mg Oral Daily  . folic acid  1 mg Oral Daily  . heparin  5,000 Units Subcutaneous 3 times per day  . lipase/protease/amylase  12,000 Units Oral TID AC  . multivitamin  1 tablet Oral QHS  . pantoprazole  40 mg Oral Daily  . vancomycin  500 mg Intravenous Q M,W,F-HD  . vitamin B-12  250 mcg Oral QPM

## 2014-12-08 NOTE — Patient Outreach (Signed)
[facility]) Care Management  12/08/2014  CLYDELL ALBERTS 1956/04/30 [id number]   CSW noted that patient presented to the Emergency Department at [facility] on Sunday, July 3rd with marked erythema and edema to the left upper extremity and swelling to her vascular graft site, placed just three days prior, on Thursday, June 30th.  Patient was also found to have a low-grade fever and leukocytosis, at that time.  There is concern that patient may have developed an early infection at her surgery site.  Patient was admitted for further evaluation, where patient continues to reside as of 11:30am today.  CSW will continue to follow patient while hospitalized.  Nat Christen, BSW, MSW, [facility] [place], [place], [place] [address] [email or web address] [phone]

## 2014-12-08 NOTE — Telephone Encounter (Signed)
Waiting for Morey Hummingbird to return my call

## 2014-12-08 NOTE — Clinical Documentation Improvement (Signed)
Per H&P & 7/4 MD Progress Note: "Malnutrition: Patient underweight 2/2 chronic alcohol abuse."  Per 7/4 Consult Note, BMI = 16.45 kg/m2 and "Nutrition - alb 1.2 Malnutrition-renal diet vitamin and supplements- reports eats small meals and wt loss".  Can this patient's malnutrition possibly be further specified by degree, for example:  Severe protein calorie malnutriton Moderate malnutrition Mild malnutrition Malnutrition, not further specified   Thank You, Carrolyn Meiers, [email or web address] [phone]

## 2014-12-08 NOTE — Progress Notes (Addendum)
      Patient states her arm feels a little better and is not as tight and painful.  Decreased edema left UE upper arm, compartments soft, palpable thrill in graft. Distally left UE hand grip 5/5, sensation grossly intact and palpable radial pulse   status post left upper arm AV graft on 12/03/2014 by Dr. Kellie Simmering.  Improving edema continue current treatment IV antibiotics  COLLINS, EMMA MAUREEN PA-C Left upper extremity examined  Excellent pulse and palpable thrill over graft Mild edema surrounding graft and mild edema distally-appropriate amount No erythema overlying graft and no drainage from surgical incisions which are healing nicely 2+ radial pulse palpable distally and no evidence of steal syndrome  No evidence on physical exam that this graft is infected. It is appropriately tender with some edema  Patient should be able to be discharged in the next day or 2. We'll continue to follow while in hospital but do not think there is evidence that this graft is infected

## 2014-12-09 ENCOUNTER — Telehealth: Payer: Self-pay | Admitting: Vascular Surgery

## 2014-12-09 DIAGNOSIS — E43 Unspecified severe protein-calorie malnutrition: Secondary | ICD-10-CM

## 2014-12-09 LAB — RENAL FUNCTION PANEL
Albumin: 1.2 g/dL — ABNORMAL LOW (ref 3.5–5.0)
Anion gap: 6 (ref 5–15)
BUN: 9 mg/dL (ref 6–20)
CALCIUM: 7.3 mg/dL — AB (ref 8.9–10.3)
CO2: 27 mmol/L (ref 22–32)
CREATININE: 4.03 mg/dL — AB (ref 0.44–1.00)
Chloride: 101 mmol/L (ref 101–111)
GFR, EST AFRICAN AMERICAN: 13 mL/min — AB (ref >60)
GFR, EST NON AFRICAN AMERICAN: 11 mL/min — AB (ref >60)
Glucose, Bld: 169 mg/dL — ABNORMAL HIGH (ref 65–99)
PHOSPHORUS: 2.5 mg/dL (ref 2.5–4.6)
Potassium: 3.7 mmol/L (ref 3.5–5.1)
SODIUM: 134 mmol/L — AB (ref 135–145)

## 2014-12-09 LAB — CBC
HCT: 33.1 % — ABNORMAL LOW (ref 36.0–46.0)
Hemoglobin: 10 g/dL — ABNORMAL LOW (ref 12.0–15.0)
MCH: 29 pg (ref 26.0–34.0)
MCHC: 30.2 g/dL (ref 30.0–36.0)
MCV: 95.9 fL (ref 78.0–100.0)
PLATELETS: 130 10*3/uL — AB (ref 150–400)
RBC: 3.45 MIL/uL — AB (ref 3.87–5.11)
RDW: 18 % — ABNORMAL HIGH (ref 11.5–15.5)
WBC: 6.5 10*3/uL (ref 4.0–10.5)

## 2014-12-09 LAB — MAGNESIUM: Magnesium: 1.9 mg/dL (ref 1.7–2.4)

## 2014-12-09 LAB — HEPATITIS B SURFACE ANTIGEN: Hepatitis B Surface Ag: NEGATIVE

## 2014-12-09 MED ORDER — DARBEPOETIN ALFA 100 MCG/0.5ML IJ SOSY
PREFILLED_SYRINGE | INTRAMUSCULAR | Status: AC
  Administered 2014-12-09: 19:00:00
  Filled 2014-12-09: qty 0.5

## 2014-12-09 MED ORDER — SODIUM CHLORIDE 0.9 % IV SOLN: 100.0000 mL | INTRAVENOUS | Status: DC | PRN

## 2014-12-09 MED ORDER — HEPARIN SODIUM (PORCINE) 1000 UNIT/ML DIALYSIS: 1000.0000 [IU] | INTRAMUSCULAR | Status: DC | PRN

## 2014-12-09 MED ORDER — CEPHALEXIN 250 MG PO CAPS: 250.0000 mg | ORAL_CAPSULE | Freq: Two times a day (BID) | ORAL | Status: DC

## 2014-12-09 MED ORDER — NEPRO/CARBSTEADY PO LIQD: 237.0000 mL | ORAL | Status: DC | PRN

## 2014-12-09 MED ORDER — PENTAFLUOROPROP-TETRAFLUOROETH EX AERO: 1.0000 "application " | INHALATION_SPRAY | CUTANEOUS | Status: DC | PRN

## 2014-12-09 MED ORDER — CEPHALEXIN 250 MG PO CAPS
250.0000 mg | ORAL_CAPSULE | Freq: Two times a day (BID) | ORAL | Status: DC
  Administered 2014-12-09: 250 mg via ORAL
  Filled 2014-12-09 (×2): qty 1

## 2014-12-09 MED ORDER — ALTEPLASE 2 MG IJ SOLR
2.0000 mg | Freq: Once | INTRAMUSCULAR | Status: DC | PRN
  Filled 2014-12-09: qty 2

## 2014-12-09 MED ORDER — LIDOCAINE HCL (PF) 1 % IJ SOLN: 5.0000 mL | INTRAMUSCULAR | Status: DC | PRN

## 2014-12-09 MED ORDER — LIDOCAINE-PRILOCAINE 2.5-2.5 % EX CREA
1.0000 "application " | TOPICAL_CREAM | CUTANEOUS | Status: DC | PRN
  Filled 2014-12-09: qty 5

## 2014-12-09 MED ORDER — CAMPHOR-MENTHOL 0.5-0.5 % EX LOTN
1.0000 "application " | TOPICAL_LOTION | CUTANEOUS | Status: DC | PRN
  Filled 2014-12-09: qty 222

## 2014-12-09 NOTE — Progress Notes (Signed)
Pt has discharge orders. Pt was able to take her night dose of medicines including Cephalexin PO. Discharge instructions given by charge RN Cathie. Pt signed. Discharge per wheelchair, alert and oriented, accompanied by daughter.

## 2014-12-09 NOTE — Progress Notes (Signed)
Report called to primary RN 6E. Patient returned safely to room.

## 2014-12-09 NOTE — Progress Notes (Signed)
Patient seen and examined. Case d/w residents in detail. I agree with findings and plan as documented in Dr. Jodene Nam note.  Patient feels well today. Still with mild pain at AVG site. Will c/w PO keflex for now. Patient is stable for d/c home today post HD

## 2014-12-09 NOTE — Telephone Encounter (Addendum)
-----   Message from Mena Goes, RN sent at 12/03/2014 11:36 AM EDT ----- Regarding: Schedule   ----- Message -----    From: Gabriel Earing, PA-C    Sent: 12/03/2014  11:33 AM      To: Vvs Charge Pool  S/p LUA AV loop graft 12/03/14.  Dr. Kellie Simmering would like to see her back in 2 weeks.  Thanks  notified patient's daughter of post op appt. on 12-22-14 at 11:15

## 2014-12-09 NOTE — Progress Notes (Signed)
[facility] Rounding Note  Subjective:   No complaints- arm feels about the same.  Objective Filed Vitals:   12/08/14 1738 12/08/14 2017 12/09/14 0519 12/09/14 0831  BP: 90/51 86/46 98/60  92/60  Pulse: 81 78 81 87  Temp: 98 F (36.7 C) 97.5 F (36.4 C) 97.8 F (36.6 C) 98.3 F (36.8 C)  TempSrc: Oral Oral  Oral  Resp: 18 16 17 18   Height:      Weight:      SpO2: 98% 100% 100% 93%   Physical Exam General: alert and oriented, no acute distress. Eating breakfast Heart: RRR Lungs: CTA, unlabored Abdomen: soft, nontender +BS Extremities: no LE edema Dialysis Access:  R IJ cath.   L AVG - placed 6/30 +b/t. Edema improving, mild tenderness  Dialysis Orders: MWF [facility] 4hr 40 kgs 2K/2.25Ca R IJ cath Heparin 2000u  No meds  Assessment/Plan: 1. Fevers/night sweats/leukocytosis - L AVG infection vs gortex reaction. - VVS following. Vanc and Ancef started- to transition to Keflex today. Blood cultures no growth to date. WBC 10.9- down from 12.2- afebrile 2. ESRD - MWF [place], just transferred from [place], was at [facility] and rehab since the beginning of June- recently started HD end of May while admitted for volume overload- HD pending today 3. Hypertension/volume - hypotension- no outpt BP meds. under edw. Post wt 38.5kgs- small gains- no more than 1.5L at outpt HD 4. Anemia - hgb 10.3-  start ESA- received Fe bolus for tsat 21 5. Metabolic bone disease -last phos 2.9 and PTH 371. Corrected Ca+ 9.1. No binders 6. Nutrition - alb 1.2 Malnutrition-renal diet vitamin and supplements- reports eats small meals and wt loss.  7. History of alcohol abuse 8. Hypokalemia- K+4.4 after a 40 mEq po dose yesterday. Will likely need 4K bath as outpt.  Shelle Iron, NP [place] [phone] 12/09/2014,9:20 AM  LOS: 3 days   I have seen and examined this patient and agree with plan as outlined in the above note with highlighted addition.  Appears plan is to transition to keflex, though at this time ancef still active. For HD today/cbd. Nikola Blackston B,MD 12/09/2014 12:39 PM  Additional Objective Labs:   Recent Labs Lab 12/06/14 1720 12/07/14 0505 12/08/14 1052  NA 137 135 133*  K 3.3* 3.1* 4.4  CL 100* 98* 101  CO2 28 26 25   GLUCOSE 89 74 100*  BUN 9 11 7   CREATININE 3.02* 3.65* 2.75*  CALCIUM 7.3* 6.9* 7.1*  PHOS  --   --  2.9   Liver Function Tests:  Recent Labs Lab 12/06/14 1720 12/07/14 0505 12/08/14 1052  AST 47* 33  --   ALT 13* 9*  --   ALKPHOS 386* 276*  --   BILITOT 0.6 0.7  --   PROT 7.5 6.1*  --   ALBUMIN 1.4* 1.2* 1.2*   CBC:  Recent Labs Lab 12/06/14 1720 12/07/14 0505 12/08/14 1052  WBC 12.2* 10.2 10.9*  NEUTROABS 7.8*  --   --   HGB 10.9* 9.3* 10.3*  HCT 35.8* 30.8* 33.0*  MCV 98.6 96.0 95.4  PLT 199 165 125*   Blood Culture    Component Value Date/Time   SDES BLOOD RIGHT HAND 12/06/2014 2106   SPECREQUEST BOTTLES DRAWN AEROBIC ONLY 2CC 12/06/2014 2106   CULT NO GROWTH 2 DAYS 12/06/2014 2106   REPTSTATUS PENDING 12/06/2014 2106   CBG:  Recent Labs Lab 12/08/14 1157  GLUCAP 141*    Medications:   .  ceFAZolin (ANCEF) IV  1 g Intravenous Q24H  . darbepoetin (ARANESP) injection - DIALYSIS  100 mcg Intravenous Q Wed-HD  . feeding supplement (NEPRO CARB STEADY)  237 mL Oral TID WC  . FLUoxetine  10 mg Oral Daily  . folic acid  1 mg Oral Daily  . heparin  2,000 Units Dialysis Once in dialysis  . heparin  2,000 Units Dialysis Once in dialysis  . heparin  5,000 Units Subcutaneous 3 times per day  . lipase/protease/amylase  12,000 Units Oral TID AC  . multivitamin  1 tablet Oral QHS  . pantoprazole  40 mg Oral Daily  . vitamin B-12  250 mcg Oral QPM

## 2014-12-09 NOTE — Progress Notes (Addendum)
  [facility] Progress Note  Subjective  Some soreness with left arm    Objective Filed Vitals:   12/09/14 0831  BP: 92/60  Pulse: 87  Temp: 98.3 F (36.8 C)  Resp: 18    Intake/Output Summary (Last 24 hours) at 12/09/14 0959 Last data filed at 12/09/14 0852  Gross per 24 hour  Intake    800 ml  Output      0 ml  Net    800 ml    Incisions healing well. 2+ left radial pulse. Left arm edema, mild tenderness to palpation. Audible bruit.   Assessment/Planning: 59 y.o. female is s/p: left upper arm graft 12/03/14  Graft is patent and does not appear infected. Edema is improving. No signs of steal syndrome. [place] for d/c from vascular standpoint.   Alvia Grove 12/09/2014 9:59 AM --  Laboratory CBC    Component Value Date/Time   WBC 10.9* 12/08/2014 1052   WBC 7.2 10/26/2011 1336   HGB 10.3* 12/08/2014 1052   HGB 9.7* 10/26/2011 1336   HCT 33.0* 12/08/2014 1052   HCT 28.8* 10/26/2011 1336   PLT 125* 12/08/2014 1052   PLT 125* 10/26/2011 1336    BMET    Component Value Date/Time   NA 133* 12/08/2014 1052   K 4.4 12/08/2014 1052   CL 101 12/08/2014 1052   CO2 25 12/08/2014 1052   GLUCOSE 100* 12/08/2014 1052   BUN 7 12/08/2014 1052   CREATININE 2.75* 12/08/2014 1052   CREATININE 4.40* 10/08/2014 1018   CALCIUM 7.1* 12/08/2014 1052   CALCIUM 4.0* 09/17/2014 2048   GFRNONAA 18* 12/08/2014 1052   GFRNONAA 14* 09/30/2014 1013   GFRAA 21* 12/08/2014 1052   GFRAA 16* 09/30/2014 1013    COAG Lab Results  Component Value Date   INR 1.67* 10/22/2014   INR 1.86* 10/19/2014   INR 2.13* 10/13/2014   No results found for: PTT  Antibiotics Anti-infectives    Start     Dose/Rate Route Frequency Ordered Stop   12/08/14 1100  ceFAZolin (ANCEF) IVPB 1 g/50 mL premix     1 g 100 mL/hr over 30 Minutes Intravenous Every 24 hours 12/07/14 1416     12/07/14 2200  vancomycin (VANCOCIN) 500 mg in sodium chloride 0.9 % 100 mL IVPB  Status:   Discontinued     500 mg 100 mL/hr over 60 Minutes Intravenous Every M-W-F (Hemodialysis) 12/07/14 2132 12/08/14 1459   12/07/14 1000  ceFAZolin (ANCEF) IVPB 1 g/50 mL premix  Status:  Discontinued     1 g 100 mL/hr over 30 Minutes Intravenous Every 12 hours 12/06/14 2258 12/07/14 1416   12/06/14 2200  ceFAZolin (ANCEF) IVPB 1 g/50 mL premix  Status:  Discontinued     1 g 100 mL/hr over 30 Minutes Intravenous 3 times per day 12/06/14 2119 12/06/14 2258   12/06/14 1900  vancomycin (VANCOCIN) IVPB 1000 mg/200 mL premix     1,000 mg 200 mL/hr over 60 Minutes Intravenous  Once 12/06/14 1747 12/06/14 2102   12/06/14 1900  piperacillin-tazobactam (ZOSYN) IVPB 2.25 g  Status:  Discontinued     2.25 g 100 mL/hr over 30 Minutes Intravenous Every 8 hours 12/06/14 1747 12/06/14 2119       Virgina Jock, PA-C [facility] Office: [phone] Pager: [phone] 12/09/2014 9:59 AM     Agree with above Okay to DC from our standpoint

## 2014-12-09 NOTE — Procedures (Signed)
I have personally attended this patient's dialysis session.   4K bath K 3.7 Access is TDC running at 400 No issues  Jamal Maes, MD [place] Pager 12/09/2014, 4:59 PM

## 2014-12-09 NOTE — Progress Notes (Signed)
Pt HD completed. Pt alert, vss. Attempting to call report to [place] primary RN. CCMD notified pt back on 6E box.

## 2014-12-09 NOTE — Progress Notes (Signed)
Subjective: Patient reports her left arm still hurts, no much changed Objective: Vital signs in last 24 hours: Filed Vitals:   12/09/14 1530 12/09/14 1600 12/09/14 1630 12/09/14 1700  BP: 90/59 82/48 94/66  79/55  Pulse: 87 75 89 89  Temp:      TempSrc:      Resp: 20 20 23 20   Height:      Weight:      SpO2:       Weight change:   Intake/Output Summary (Last 24 hours) at 12/09/14 1721 Last data filed at 12/09/14 1245  Gross per 24 hour  Intake    680 ml  Output      0 ml  Net    680 ml   General: resting in bed, thin HEENT: PERRL, EOMI, no scleral icterus Cardiac: RRR, no rubs, murmurs or gallops Pulm: CTAB Abd: soft, nontender, nondistended, BS present Ext: left arm remains tender and swollen, no much erythema appreciated, + bruit. Neuro: alert and oriented X3 Lab Results: Basic Metabolic Panel:  Recent Labs Lab 12/07/14 0730 12/08/14 1052 12/09/14 1500  NA  --  133* 134*  K  --  4.4 3.7  CL  --  101 101  CO2  --  25 27  GLUCOSE  --  100* 169*  BUN  --  7 9  CREATININE  --  2.75* 4.03*  CALCIUM  --  7.1* 7.3*  MG 1.5*  --  1.9  PHOS  --  2.9 2.5   Liver Function Tests:  Recent Labs Lab 12/06/14 1720 12/07/14 0505 12/08/14 1052 12/09/14 1500  AST 47* 33  --   --   ALT 13* 9*  --   --   ALKPHOS 386* 276*  --   --   BILITOT 0.6 0.7  --   --   PROT 7.5 6.1*  --   --   ALBUMIN 1.4* 1.2* 1.2* 1.2*   No results for input(s): LIPASE, AMYLASE in the last 168 hours. No results for input(s): AMMONIA in the last 168 hours. CBC:  Recent Labs Lab 12/06/14 1720  12/08/14 1052 12/09/14 1500  WBC 12.2*  < > 10.9* 6.5  NEUTROABS 7.8*  --   --   --   HGB 10.9*  < > 10.3* 10.0*  HCT 35.8*  < > 33.0* 33.1*  MCV 98.6  < > 95.4 95.9  PLT 199  < > 125* 130*  < > = values in this interval not displayed. Cardiac Enzymes: No results for input(s): CKTOTAL, CKMB, CKMBINDEX, TROPONINI in the last 168 hours. BNP: No results for input(s): PROBNP in the last 168  hours. D-Dimer: No results for input(s): DDIMER in the last 168 hours. CBG:  Recent Labs Lab 12/08/14 1157  GLUCAP 141*   Hemoglobin A1C: No results for input(s): HGBA1C in the last 168 hours. Fasting Lipid Panel: No results for input(s): CHOL, HDL, LDLCALC, TRIG, CHOLHDL, LDLDIRECT in the last 168 hours. Thyroid Function Tests: No results for input(s): TSH, T4TOTAL, FREET4, T3FREE, THYROIDAB in the last 168 hours. Coagulation: No results for input(s): LABPROT, INR in the last 168 hours. Anemia Panel: No results for input(s): VITAMINB12, FOLATE, FERRITIN, TIBC, IRON, RETICCTPCT in the last 168 hours. Urine Drug Screen: Drugs of Abuse     Component Value Date/Time   LABOPIA POSITIVE* 10/13/2014 0412   LABOPIA NEG 09/18/2011 0936   COCAINSCRNUR NONE DETECTED 10/13/2014 0412   COCAINSCRNUR NEG 09/18/2011 0936   LABBENZ NONE DETECTED 10/13/2014 7616  LABBENZ NEG 09/18/2011 0936   LABBENZ NEG 04/10/2011 1130   AMPHETMU NONE DETECTED 10/13/2014 0412   AMPHETMU NEG 09/18/2011 0936   AMPHETMU NEG 04/10/2011 1130   THCU NONE DETECTED 10/13/2014 0412   THCU NEG 09/18/2011 0936   LABBARB NONE DETECTED 10/13/2014 0412   LABBARB NEG 09/18/2011 0936    Alcohol Level: No results for input(s): ETH in the last 168 hours. Urinalysis: No results for input(s): COLORURINE, LABSPEC, PHURINE, GLUCOSEU, HGBUR, BILIRUBINUR, KETONESUR, PROTEINUR, UROBILINOGEN, NITRITE, LEUKOCYTESUR in the last 168 hours.  Invalid input(s): APPERANCEUR  Micro Results: Recent Results (from the past 240 hour(s))  Culture, blood (routine x 2)     Status: None (Preliminary result)   Collection Time: 12/06/14  5:20 PM  Result Value Ref Range Status   Specimen Description BLOOD RIGHT FOREARM  Final   Special Requests BOTTLES DRAWN AEROBIC AND ANAEROBIC 4.5CC  Final   Culture NO GROWTH 3 DAYS  Final   Report Status PENDING  Incomplete  Culture, blood (routine x 2)     Status: None (Preliminary result)    Collection Time: 12/06/14  9:06 PM  Result Value Ref Range Status   Specimen Description BLOOD RIGHT HAND  Final   Special Requests BOTTLES DRAWN AEROBIC ONLY 2CC  Final   Culture NO GROWTH 3 DAYS  Final   Report Status PENDING  Incomplete  MRSA PCR Screening     Status: None   Collection Time: 12/06/14 11:09 PM  Result Value Ref Range Status   MRSA by PCR NEGATIVE NEGATIVE Final    Comment:        The GeneXpert MRSA Assay (FDA approved for NASAL specimens only), is one component of a comprehensive MRSA colonization surveillance program. It is not intended to diagnose MRSA infection nor to guide or monitor treatment for MRSA infections.    Studies/Results: No results found. Medications: I have reviewed the patient's current medications. Scheduled Meds: . cephALEXin  250 mg Oral Q12H  . darbepoetin (ARANESP) injection - DIALYSIS  100 mcg Intravenous Q Wed-HD  . feeding supplement (NEPRO CARB STEADY)  237 mL Oral TID WC  . FLUoxetine  10 mg Oral Daily  . folic acid  1 mg Oral Daily  . heparin  2,000 Units Dialysis Once in dialysis  . heparin  5,000 Units Subcutaneous 3 times per day  . lipase/protease/amylase  12,000 Units Oral TID AC  . multivitamin  1 tablet Oral QHS  . pantoprazole  40 mg Oral Daily  . 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  Vascular is signing off does not think graft is infected but wants to continue Abx. - Change to Keflex 250mg  BID - Stable for D/C home today - BCx no growth at 3 days    Severe protein-calorie malnutrition - Continue supplements    ESRD on dialysis - To go to dialysis today. -After dialsis  may be d/c home    Essential hypertension - Continue home BP meds      Dispo: D/C home after HD  The patient does have a current PCP Iline Oven, MD) and does need an [facility] hospital follow-up appointment after discharge.  The patient does not know have transportation limitations that hinder transportation to clinic appointments.  .Services Needed at time  of discharge: Y = Yes, Blank = No PT:   OT:   RN:   Equipment:   Other:     LOS: 3 days   Lucious Groves, DO 12/09/2014, 5:21 PM

## 2014-12-10 DIAGNOSIS — N186 End stage renal disease: Secondary | ICD-10-CM | POA: Diagnosis not present

## 2014-12-10 DIAGNOSIS — M6281 Muscle weakness (generalized): Secondary | ICD-10-CM | POA: Diagnosis not present

## 2014-12-10 DIAGNOSIS — R262 Difficulty in walking, not elsewhere classified: Secondary | ICD-10-CM | POA: Diagnosis not present

## 2014-12-10 DIAGNOSIS — I10 Essential (primary) hypertension: Secondary | ICD-10-CM | POA: Diagnosis not present

## 2014-12-10 NOTE — Addendum Note (Signed)
Addended by: Truddie Crumble on: 12/10/2014 05:40 PM   Modules accepted: Orders

## 2014-12-11 ENCOUNTER — Other Ambulatory Visit: Payer: Self-pay | Admitting: *Deleted

## 2014-12-11 ENCOUNTER — Encounter: Payer: Self-pay | Admitting: *Deleted

## 2014-12-11 DIAGNOSIS — E8770 Fluid overload, unspecified: Secondary | ICD-10-CM | POA: Diagnosis not present

## 2014-12-11 DIAGNOSIS — D688 Other specified coagulation defects: Secondary | ICD-10-CM | POA: Diagnosis not present

## 2014-12-11 DIAGNOSIS — D631 Anemia in chronic kidney disease: Secondary | ICD-10-CM | POA: Diagnosis not present

## 2014-12-11 DIAGNOSIS — N186 End stage renal disease: Secondary | ICD-10-CM | POA: Diagnosis not present

## 2014-12-11 DIAGNOSIS — T8249XD Other complication of vascular dialysis catheter, subsequent encounter: Secondary | ICD-10-CM | POA: Diagnosis not present

## 2014-12-11 DIAGNOSIS — D509 Iron deficiency anemia, unspecified: Secondary | ICD-10-CM | POA: Diagnosis not present

## 2014-12-11 DIAGNOSIS — E876 Hypokalemia: Secondary | ICD-10-CM | POA: Diagnosis not present

## 2014-12-11 LAB — CULTURE, BLOOD (ROUTINE X 2)
CULTURE: NO GROWTH
CULTURE: NO GROWTH

## 2014-12-11 NOTE — Patient Outreach (Signed)
[facility]) Care Management  12/11/2014  [name] [name] 1955/12/20 [id number]   CSW made one last attempt to try and contact patient today to perform the initial phone assessment, as well as assess and assist with social work needs and services, without success.  CSW will proceed with case closure on patient, due to inability to establish initial contact with patient, despite four phone attempts and outreach letter mailed to patient's home, providing 10 business days for response.  CSW will notify patient's Primary Care Physician, Dr. Viviano Simas of CSW's plans to close patient's case.  CSW will submit a case closure request to Lurline Del, Care Management Assistant with [facility], in the form of an [employer].  CSW will inform patient's RNCM with [place] [facility], Natividad Brood of CSW's plans to close patient's case.  Nat Christen, BSW, MSW, [place] [facility] [place], [place], [place] [address] [email or web address] [phone]

## 2014-12-12 NOTE — Discharge Summary (Signed)
Name: [name] [name] MRN: [id number] DOB: January 19, 1956 59 y.o. PCP: [name] Oven, [name]  Date of Admission: 12/06/2014  4:30 PM Date of Discharge: 12/12/2014 Attending Physician: [name] [name] Discharge Diagnosis: 1. AVG Reaction vs Infection 2. ESRD on HD 3. Hypokalemia/hypomagnesemia 4. Severe Protein Calorie Malnutrition  Discharge Medications:   Medication List    TAKE these medications        ACIDOPHILUS PROBIOTIC PO  Take 2 mg by mouth 3 (three) times daily. Take 2 (two) tablets by mouth three times daily     albuterol 108 (90 BASE) MCG/ACT inhaler  Commonly known as:  PROAIR HFA  Inhale 1-2 puffs into the lungs every 6 (six) hours as needed for wheezing or shortness of breath.     cephALEXin 250 MG capsule  Commonly known as:  KEFLEX  Take 1 capsule (250 mg total) by mouth every 12 (twelve) hours.     cetirizine 10 MG tablet  Commonly known as:  ZYRTEC  Take 1 tablet (10 mg total) by mouth daily as needed for allergies.     CREON 12000 UNITS Cpep capsule  Generic drug:  lipase/protease/amylase  Take 12,000 Units by mouth 3 (three) times daily before meals.     ENSURE  Take 237 mLs by mouth 2 (two) times daily between meals.     feeding supplement (NEPRO CARB STEADY) Liqd  Take 237 mLs by mouth 3 (three) times daily with meals.     FLUoxetine 10 MG capsule  Commonly known as:  PROZAC  Take 1 capsule (10 mg total) by mouth daily. For depression     folic acid 1 MG tablet  Commonly known as:  FOLVITE  Take 1 tablet (1 mg total) by mouth daily. For folic acid replacement     lisinopril 2.5 MG tablet  Commonly known as:  PRINIVIL,ZESTRIL  Take 2.5 mg by mouth daily.     loperamide 2 MG capsule  Commonly known as:  IMODIUM  Take 1 capsule (2 mg total) by mouth as needed for diarrhea or loose stools.     mirtazapine 7.5 MG tablet  Commonly known as:  REMERON  Take 7.5 mg by mouth at bedtime.     multivitamin with minerals Tabs tablet  Take 1  tablet by mouth daily. For vitamin replacement     omeprazole 40 MG capsule  Commonly known as:  PRILOSEC  Take 1 capsule (40 mg total) by mouth daily.     oxyCODONE 5 MG immediate release tablet  Commonly known as:  Oxy IR/ROXICODONE  Take 1 tablet (5 mg total) by mouth every 6 (six) hours as needed for moderate pain or severe pain.     ramelteon 8 MG tablet  Commonly known as:  ROZEREM  Take 1 tablet (8 mg total) by mouth at bedtime.     thiamine 100 MG tablet  Commonly known as:  VITAMIN B-1  Take 1 tablet (100 mg total) by mouth daily. For low thiamine     vitamin B-12 250 MCG tablet  Commonly known as:  CYANOCOBALAMIN  Take 1 tablet (250 mcg total) by mouth every evening.        Disposition and follow-up:   [name] [name] was discharged from [facility] in Good condition.  At the hospital follow up visit please address:  1.  LUE AVG: Does graft site appear erythematous, edematous, tender? Has patient finished full course of ABx? Thrill/bruit? Final blood cultures negative.  Hypomag/HypoK: Check BMP.  2.  Labs / imaging needed at time of follow-up: BMP, magnesium  3.  Pending labs/ test needing follow-up: None  Follow-up Appointments:     Follow-up Information    Follow up with [name] Gens, [name] On 12/22/2014.   Specialties:  Vascular Surgery, Interventional Cardiology, Cardiology   Why:  at 11:15am, please arrive 15 minutes early   Contact information:   [place] [phone]       Discharge Instructions: Discharge Instructions    Call [name] for:  severe uncontrolled pain    Complete by:  As directed      Call [name] for:  temperature >100.4    Complete by:  As directed      Diet - low sodium heart healthy    Complete by:  As directed      Increase activity slowly    Complete by:  As directed            Consultations: Treatment Team:  [name] Maes, [name] [name] Misty, [name]  Procedures Performed:  Dg  Chest Port 1 View  12/06/2014   CLINICAL DATA:  Right breast swelling.  Hypotension.  EXAM: PORTABLE CHEST - 1 VIEW  COMPARISON:  10/29/2014 and 10/22/2014  FINDINGS: Double lumen dialysis catheter appears in good position with the tips in the right atrium. Chronic cardiomegaly. No residual left effusion. Small residual right effusion, diminished. Pulmonary vascular congestion has resolved.  IMPRESSION: Resolution of pulmonary vascular congestion and left effusion. Decreased right effusion.   Electronically Signed   By: [name] [name] M.D.   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 She underwent AV graft placement on December 03, 2014. She reports that she was doing fine following the surgery, until yesterday (7/2), when she started to note pain at the surgery site. This morning (7/3), she noted increased arm pain, redness, and swelling. She also endorses chills, sweats, nausea, and vomiting. She denies fevers. Her last dialysis was Saturday (7/2) through temporary RIJ access. She denies receiving blood products at that time.  In the ED, she was noted to have a temp of 38.1C, tachycardic at 95-120 bpm, tachypneic at 20-22 breaths/min, and WBC of 12.2. Blood cultures were obtained and the patient was started on Vanc and Zosyn.  Hospital Course by problem list:  1. AVG Reaction vs Infection- Patient presented w/ 2 days of pain, erythema, and significant tenderness overlying new LUE AVG. Significant overlying erythema, tenderness, swelling, and induration on exam initially. Patient presented w/ fever, leukocytosis, and skin changes as described. Unlikely to be clot based on presence of good thrill and bruit, doppler negative. Could have been  infection vs graft reaction to Gore-Tex. VVS felt this was not graft infection. Started on Vancomycin + Ancef in the ED, Vanc discontinued and sent home on Keflex. Prior to discharge, fever, leukocytosis, and appearance improved significantly.   2. ESRD on HD- Changed to MWF schedule.   3. Hypokalemia/hypomagnesemia- Chronic. Most likely related to alcohol abuse and malnutrition. Repleted throughout admission. Will need follow up BMP.   4. Severe Protein Calorie Malnutrition- Patient underweight 2/2 chronic alcohol abuse vs multiple co-morbidities. Continued vitamin and diet supplementation w/ Creon, B12, folate, and multivitamin.   Discharge  Vitals:   BP 91/45 mmHg  Pulse 94  Temp(Src) 98.6 F (37 C) (Oral)  Resp 18  Ht 5\' 1"  (1.549 m)  Wt 85 lb 15.7 oz (39 kg)  BMI 16.25 kg/m2  SpO2 94%  Discharge Labs:  No results found for this or any previous visit (from the past 24 hour(s)).  Signed: Corky Sox, [name] 12/12/2014, 6:14 PM    Services Ordered on Discharge: None Equipment Ordered on Discharge: None

## 2014-12-14 DIAGNOSIS — N186 End stage renal disease: Secondary | ICD-10-CM | POA: Diagnosis not present

## 2014-12-14 DIAGNOSIS — E8770 Fluid overload, unspecified: Secondary | ICD-10-CM | POA: Diagnosis not present

## 2014-12-14 DIAGNOSIS — T8249XD Other complication of vascular dialysis catheter, subsequent encounter: Secondary | ICD-10-CM | POA: Diagnosis not present

## 2014-12-14 DIAGNOSIS — D688 Other specified coagulation defects: Secondary | ICD-10-CM | POA: Diagnosis not present

## 2014-12-14 DIAGNOSIS — I10 Essential (primary) hypertension: Secondary | ICD-10-CM | POA: Diagnosis not present

## 2014-12-14 DIAGNOSIS — D631 Anemia in chronic kidney disease: Secondary | ICD-10-CM | POA: Diagnosis not present

## 2014-12-14 DIAGNOSIS — E876 Hypokalemia: Secondary | ICD-10-CM | POA: Diagnosis not present

## 2014-12-14 DIAGNOSIS — R262 Difficulty in walking, not elsewhere classified: Secondary | ICD-10-CM | POA: Diagnosis not present

## 2014-12-14 DIAGNOSIS — M6281 Muscle weakness (generalized): Secondary | ICD-10-CM | POA: Diagnosis not present

## 2014-12-14 DIAGNOSIS — D509 Iron deficiency anemia, unspecified: Secondary | ICD-10-CM | POA: Diagnosis not present

## 2014-12-15 DIAGNOSIS — R262 Difficulty in walking, not elsewhere classified: Secondary | ICD-10-CM | POA: Diagnosis not present

## 2014-12-15 DIAGNOSIS — M6281 Muscle weakness (generalized): Secondary | ICD-10-CM | POA: Diagnosis not present

## 2014-12-15 DIAGNOSIS — N186 End stage renal disease: Secondary | ICD-10-CM | POA: Diagnosis not present

## 2014-12-15 DIAGNOSIS — I10 Essential (primary) hypertension: Secondary | ICD-10-CM | POA: Diagnosis not present

## 2014-12-15 NOTE — Patient Outreach (Signed)
[facility]) Care Management  12/15/2014  [name] [name] 05/15/1956 [id number]   Notification from Nat Christen, LCSW to close case due to unable to contact for [place] [facility] services.  Ronnell [place]. [place], [place] [facility] [place] Assistant Phone: [phone] Fax: [phone]

## 2014-12-16 DIAGNOSIS — E876 Hypokalemia: Secondary | ICD-10-CM | POA: Diagnosis not present

## 2014-12-16 DIAGNOSIS — D631 Anemia in chronic kidney disease: Secondary | ICD-10-CM | POA: Diagnosis not present

## 2014-12-16 DIAGNOSIS — E8770 Fluid overload, unspecified: Secondary | ICD-10-CM | POA: Diagnosis not present

## 2014-12-16 DIAGNOSIS — D509 Iron deficiency anemia, unspecified: Secondary | ICD-10-CM | POA: Diagnosis not present

## 2014-12-16 DIAGNOSIS — D688 Other specified coagulation defects: Secondary | ICD-10-CM | POA: Diagnosis not present

## 2014-12-16 DIAGNOSIS — T8249XD Other complication of vascular dialysis catheter, subsequent encounter: Secondary | ICD-10-CM | POA: Diagnosis not present

## 2014-12-16 DIAGNOSIS — N186 End stage renal disease: Secondary | ICD-10-CM | POA: Diagnosis not present

## 2014-12-17 DIAGNOSIS — R262 Difficulty in walking, not elsewhere classified: Secondary | ICD-10-CM | POA: Diagnosis not present

## 2014-12-17 DIAGNOSIS — N186 End stage renal disease: Secondary | ICD-10-CM | POA: Diagnosis not present

## 2014-12-17 DIAGNOSIS — I10 Essential (primary) hypertension: Secondary | ICD-10-CM | POA: Diagnosis not present

## 2014-12-17 DIAGNOSIS — M6281 Muscle weakness (generalized): Secondary | ICD-10-CM | POA: Diagnosis not present

## 2014-12-18 ENCOUNTER — Encounter: Payer: Self-pay | Admitting: Vascular Surgery

## 2014-12-18 DIAGNOSIS — N186 End stage renal disease: Secondary | ICD-10-CM | POA: Diagnosis not present

## 2014-12-18 DIAGNOSIS — D688 Other specified coagulation defects: Secondary | ICD-10-CM | POA: Diagnosis not present

## 2014-12-18 DIAGNOSIS — D631 Anemia in chronic kidney disease: Secondary | ICD-10-CM | POA: Diagnosis not present

## 2014-12-18 DIAGNOSIS — D509 Iron deficiency anemia, unspecified: Secondary | ICD-10-CM | POA: Diagnosis not present

## 2014-12-18 DIAGNOSIS — E876 Hypokalemia: Secondary | ICD-10-CM | POA: Diagnosis not present

## 2014-12-18 DIAGNOSIS — T8249XD Other complication of vascular dialysis catheter, subsequent encounter: Secondary | ICD-10-CM | POA: Diagnosis not present

## 2014-12-18 DIAGNOSIS — E8770 Fluid overload, unspecified: Secondary | ICD-10-CM | POA: Diagnosis not present

## 2014-12-21 DIAGNOSIS — D688 Other specified coagulation defects: Secondary | ICD-10-CM | POA: Diagnosis not present

## 2014-12-21 DIAGNOSIS — E876 Hypokalemia: Secondary | ICD-10-CM | POA: Diagnosis not present

## 2014-12-21 DIAGNOSIS — T8249XD Other complication of vascular dialysis catheter, subsequent encounter: Secondary | ICD-10-CM | POA: Diagnosis not present

## 2014-12-21 DIAGNOSIS — D631 Anemia in chronic kidney disease: Secondary | ICD-10-CM | POA: Diagnosis not present

## 2014-12-21 DIAGNOSIS — N186 End stage renal disease: Secondary | ICD-10-CM | POA: Diagnosis not present

## 2014-12-21 DIAGNOSIS — E8770 Fluid overload, unspecified: Secondary | ICD-10-CM | POA: Diagnosis not present

## 2014-12-21 DIAGNOSIS — D509 Iron deficiency anemia, unspecified: Secondary | ICD-10-CM | POA: Diagnosis not present

## 2014-12-22 ENCOUNTER — Ambulatory Visit (INDEPENDENT_AMBULATORY_CARE_PROVIDER_SITE_OTHER): Payer: Self-pay | Admitting: Vascular Surgery

## 2014-12-22 ENCOUNTER — Encounter: Payer: Self-pay | Admitting: Vascular Surgery

## 2014-12-22 VITALS — BP 123/89 | HR 95 | Ht 61.0 in | Wt 81.7 lb

## 2014-12-22 DIAGNOSIS — N186 End stage renal disease: Secondary | ICD-10-CM

## 2014-12-22 DIAGNOSIS — M6281 Muscle weakness (generalized): Secondary | ICD-10-CM | POA: Diagnosis not present

## 2014-12-22 DIAGNOSIS — I10 Essential (primary) hypertension: Secondary | ICD-10-CM | POA: Diagnosis not present

## 2014-12-22 DIAGNOSIS — R262 Difficulty in walking, not elsewhere classified: Secondary | ICD-10-CM | POA: Diagnosis not present

## 2014-12-22 NOTE — Progress Notes (Signed)
Subjective:     [name], female   DOB: May 25, 1956, 59 y.o.   MRN: [id number]  HPI This 59 year old female with end-stage renal disease had left upper arm AV graft inserted by me 12/03/2014. Her veins were too small for fistula creation. She has had no pain or numbness in the left hand. She did develop some edema in the upper arm which is resolving. She is being dialyzed through a catheter in her right IJ.   Review of Systems     Objective:   Physical Exam BP 123/89 mmHg  Pulse 95  Ht 5\' 1"  (1.549 m)  Wt 81 lb 11.2 oz (37.059 kg)  BMI 15.45 kg/m2  SpO2 100%   Gen. Thin female in no apparent distress alert and oriented 3 Left upper extremity with well-healed distal upper arm and proximal upper arm incisions. Good pulse and palpable thrill over loop graft in left upper arm. 1-2+ radial pulse palpable distally with slight edema and forearm. No evidence of ischemia.     Assessment:       End-stage renal disease with nicely functioning left upper arm loop AV graft placed June 30   no evidence of steal syndrome    Plan:      okay to use graft in early August 2016 Return to see me on when necessary basis

## 2014-12-23 DIAGNOSIS — E8770 Fluid overload, unspecified: Secondary | ICD-10-CM | POA: Diagnosis not present

## 2014-12-23 DIAGNOSIS — J9620 Acute and chronic respiratory failure, unspecified whether with hypoxia or hypercapnia: Secondary | ICD-10-CM | POA: Diagnosis not present

## 2014-12-23 DIAGNOSIS — D509 Iron deficiency anemia, unspecified: Secondary | ICD-10-CM | POA: Diagnosis not present

## 2014-12-23 DIAGNOSIS — N186 End stage renal disease: Secondary | ICD-10-CM | POA: Diagnosis not present

## 2014-12-23 DIAGNOSIS — T8249XD Other complication of vascular dialysis catheter, subsequent encounter: Secondary | ICD-10-CM | POA: Diagnosis not present

## 2014-12-23 DIAGNOSIS — E876 Hypokalemia: Secondary | ICD-10-CM | POA: Diagnosis not present

## 2014-12-23 DIAGNOSIS — D688 Other specified coagulation defects: Secondary | ICD-10-CM | POA: Diagnosis not present

## 2014-12-23 DIAGNOSIS — D631 Anemia in chronic kidney disease: Secondary | ICD-10-CM | POA: Diagnosis not present

## 2014-12-24 DIAGNOSIS — M6281 Muscle weakness (generalized): Secondary | ICD-10-CM | POA: Diagnosis not present

## 2014-12-24 DIAGNOSIS — R262 Difficulty in walking, not elsewhere classified: Secondary | ICD-10-CM | POA: Diagnosis not present

## 2014-12-24 DIAGNOSIS — I10 Essential (primary) hypertension: Secondary | ICD-10-CM | POA: Diagnosis not present

## 2014-12-24 DIAGNOSIS — N186 End stage renal disease: Secondary | ICD-10-CM | POA: Diagnosis not present

## 2014-12-25 DIAGNOSIS — E8770 Fluid overload, unspecified: Secondary | ICD-10-CM | POA: Diagnosis not present

## 2014-12-25 DIAGNOSIS — N186 End stage renal disease: Secondary | ICD-10-CM | POA: Diagnosis not present

## 2014-12-25 DIAGNOSIS — T8249XD Other complication of vascular dialysis catheter, subsequent encounter: Secondary | ICD-10-CM | POA: Diagnosis not present

## 2014-12-25 DIAGNOSIS — E876 Hypokalemia: Secondary | ICD-10-CM | POA: Diagnosis not present

## 2014-12-25 DIAGNOSIS — D509 Iron deficiency anemia, unspecified: Secondary | ICD-10-CM | POA: Diagnosis not present

## 2014-12-25 DIAGNOSIS — D631 Anemia in chronic kidney disease: Secondary | ICD-10-CM | POA: Diagnosis not present

## 2014-12-25 DIAGNOSIS — D688 Other specified coagulation defects: Secondary | ICD-10-CM | POA: Diagnosis not present

## 2014-12-28 DIAGNOSIS — E8770 Fluid overload, unspecified: Secondary | ICD-10-CM | POA: Diagnosis not present

## 2014-12-28 DIAGNOSIS — D631 Anemia in chronic kidney disease: Secondary | ICD-10-CM | POA: Diagnosis not present

## 2014-12-28 DIAGNOSIS — T8249XD Other complication of vascular dialysis catheter, subsequent encounter: Secondary | ICD-10-CM | POA: Diagnosis not present

## 2014-12-28 DIAGNOSIS — D688 Other specified coagulation defects: Secondary | ICD-10-CM | POA: Diagnosis not present

## 2014-12-28 DIAGNOSIS — D509 Iron deficiency anemia, unspecified: Secondary | ICD-10-CM | POA: Diagnosis not present

## 2014-12-28 DIAGNOSIS — E876 Hypokalemia: Secondary | ICD-10-CM | POA: Diagnosis not present

## 2014-12-28 DIAGNOSIS — N186 End stage renal disease: Secondary | ICD-10-CM | POA: Diagnosis not present

## 2014-12-29 DIAGNOSIS — M6281 Muscle weakness (generalized): Secondary | ICD-10-CM | POA: Diagnosis not present

## 2014-12-29 DIAGNOSIS — I10 Essential (primary) hypertension: Secondary | ICD-10-CM | POA: Diagnosis not present

## 2014-12-29 DIAGNOSIS — R262 Difficulty in walking, not elsewhere classified: Secondary | ICD-10-CM | POA: Diagnosis not present

## 2014-12-29 DIAGNOSIS — N186 End stage renal disease: Secondary | ICD-10-CM | POA: Diagnosis not present

## 2014-12-30 DIAGNOSIS — D509 Iron deficiency anemia, unspecified: Secondary | ICD-10-CM | POA: Diagnosis not present

## 2014-12-30 DIAGNOSIS — D688 Other specified coagulation defects: Secondary | ICD-10-CM | POA: Diagnosis not present

## 2014-12-30 DIAGNOSIS — N186 End stage renal disease: Secondary | ICD-10-CM | POA: Diagnosis not present

## 2014-12-30 DIAGNOSIS — E8770 Fluid overload, unspecified: Secondary | ICD-10-CM | POA: Diagnosis not present

## 2014-12-30 DIAGNOSIS — D631 Anemia in chronic kidney disease: Secondary | ICD-10-CM | POA: Diagnosis not present

## 2014-12-30 DIAGNOSIS — E876 Hypokalemia: Secondary | ICD-10-CM | POA: Diagnosis not present

## 2014-12-30 DIAGNOSIS — T8249XD Other complication of vascular dialysis catheter, subsequent encounter: Secondary | ICD-10-CM | POA: Diagnosis not present

## 2014-12-31 DIAGNOSIS — N186 End stage renal disease: Secondary | ICD-10-CM | POA: Diagnosis not present

## 2014-12-31 DIAGNOSIS — R262 Difficulty in walking, not elsewhere classified: Secondary | ICD-10-CM | POA: Diagnosis not present

## 2014-12-31 DIAGNOSIS — M6281 Muscle weakness (generalized): Secondary | ICD-10-CM | POA: Diagnosis not present

## 2014-12-31 DIAGNOSIS — I10 Essential (primary) hypertension: Secondary | ICD-10-CM | POA: Diagnosis not present

## 2015-01-01 DIAGNOSIS — I10 Essential (primary) hypertension: Secondary | ICD-10-CM | POA: Diagnosis not present

## 2015-01-01 DIAGNOSIS — N186 End stage renal disease: Secondary | ICD-10-CM | POA: Diagnosis not present

## 2015-01-01 DIAGNOSIS — T8249XD Other complication of vascular dialysis catheter, subsequent encounter: Secondary | ICD-10-CM | POA: Diagnosis not present

## 2015-01-01 DIAGNOSIS — D631 Anemia in chronic kidney disease: Secondary | ICD-10-CM | POA: Diagnosis not present

## 2015-01-01 DIAGNOSIS — R262 Difficulty in walking, not elsewhere classified: Secondary | ICD-10-CM | POA: Diagnosis not present

## 2015-01-01 DIAGNOSIS — E8770 Fluid overload, unspecified: Secondary | ICD-10-CM | POA: Diagnosis not present

## 2015-01-01 DIAGNOSIS — D509 Iron deficiency anemia, unspecified: Secondary | ICD-10-CM | POA: Diagnosis not present

## 2015-01-01 DIAGNOSIS — E876 Hypokalemia: Secondary | ICD-10-CM | POA: Diagnosis not present

## 2015-01-01 DIAGNOSIS — D688 Other specified coagulation defects: Secondary | ICD-10-CM | POA: Diagnosis not present

## 2015-01-01 DIAGNOSIS — M6281 Muscle weakness (generalized): Secondary | ICD-10-CM | POA: Diagnosis not present

## 2015-01-03 DIAGNOSIS — N031 Chronic nephritic syndrome with focal and segmental glomerular lesions: Secondary | ICD-10-CM | POA: Diagnosis not present

## 2015-01-03 DIAGNOSIS — Z992 Dependence on renal dialysis: Secondary | ICD-10-CM | POA: Diagnosis not present

## 2015-01-03 DIAGNOSIS — N186 End stage renal disease: Secondary | ICD-10-CM | POA: Diagnosis not present

## 2015-01-04 DIAGNOSIS — N186 End stage renal disease: Secondary | ICD-10-CM | POA: Diagnosis not present

## 2015-01-04 DIAGNOSIS — E876 Hypokalemia: Secondary | ICD-10-CM | POA: Diagnosis not present

## 2015-01-04 DIAGNOSIS — D688 Other specified coagulation defects: Secondary | ICD-10-CM | POA: Diagnosis not present

## 2015-01-04 DIAGNOSIS — T8249XD Other complication of vascular dialysis catheter, subsequent encounter: Secondary | ICD-10-CM | POA: Diagnosis not present

## 2015-01-04 DIAGNOSIS — D509 Iron deficiency anemia, unspecified: Secondary | ICD-10-CM | POA: Diagnosis not present

## 2015-01-04 DIAGNOSIS — D631 Anemia in chronic kidney disease: Secondary | ICD-10-CM | POA: Diagnosis not present

## 2015-01-06 DIAGNOSIS — T8249XD Other complication of vascular dialysis catheter, subsequent encounter: Secondary | ICD-10-CM | POA: Diagnosis not present

## 2015-01-06 DIAGNOSIS — N186 End stage renal disease: Secondary | ICD-10-CM | POA: Diagnosis not present

## 2015-01-06 DIAGNOSIS — D509 Iron deficiency anemia, unspecified: Secondary | ICD-10-CM | POA: Diagnosis not present

## 2015-01-06 DIAGNOSIS — D631 Anemia in chronic kidney disease: Secondary | ICD-10-CM | POA: Diagnosis not present

## 2015-01-06 DIAGNOSIS — E876 Hypokalemia: Secondary | ICD-10-CM | POA: Diagnosis not present

## 2015-01-06 DIAGNOSIS — D688 Other specified coagulation defects: Secondary | ICD-10-CM | POA: Diagnosis not present

## 2015-01-08 DIAGNOSIS — E876 Hypokalemia: Secondary | ICD-10-CM | POA: Diagnosis not present

## 2015-01-08 DIAGNOSIS — I10 Essential (primary) hypertension: Secondary | ICD-10-CM | POA: Diagnosis not present

## 2015-01-08 DIAGNOSIS — T8249XD Other complication of vascular dialysis catheter, subsequent encounter: Secondary | ICD-10-CM | POA: Diagnosis not present

## 2015-01-08 DIAGNOSIS — D688 Other specified coagulation defects: Secondary | ICD-10-CM | POA: Diagnosis not present

## 2015-01-08 DIAGNOSIS — D631 Anemia in chronic kidney disease: Secondary | ICD-10-CM | POA: Diagnosis not present

## 2015-01-08 DIAGNOSIS — M6281 Muscle weakness (generalized): Secondary | ICD-10-CM | POA: Diagnosis not present

## 2015-01-08 DIAGNOSIS — R262 Difficulty in walking, not elsewhere classified: Secondary | ICD-10-CM | POA: Diagnosis not present

## 2015-01-08 DIAGNOSIS — N186 End stage renal disease: Secondary | ICD-10-CM | POA: Diagnosis not present

## 2015-01-08 DIAGNOSIS — D509 Iron deficiency anemia, unspecified: Secondary | ICD-10-CM | POA: Diagnosis not present

## 2015-01-12 DIAGNOSIS — E876 Hypokalemia: Secondary | ICD-10-CM | POA: Diagnosis not present

## 2015-01-12 DIAGNOSIS — N186 End stage renal disease: Secondary | ICD-10-CM | POA: Diagnosis not present

## 2015-01-12 DIAGNOSIS — D688 Other specified coagulation defects: Secondary | ICD-10-CM | POA: Diagnosis not present

## 2015-01-12 DIAGNOSIS — D509 Iron deficiency anemia, unspecified: Secondary | ICD-10-CM | POA: Diagnosis not present

## 2015-01-12 DIAGNOSIS — T8249XD Other complication of vascular dialysis catheter, subsequent encounter: Secondary | ICD-10-CM | POA: Diagnosis not present

## 2015-01-12 DIAGNOSIS — D631 Anemia in chronic kidney disease: Secondary | ICD-10-CM | POA: Diagnosis not present

## 2015-01-13 DIAGNOSIS — E876 Hypokalemia: Secondary | ICD-10-CM | POA: Diagnosis not present

## 2015-01-13 DIAGNOSIS — T8249XD Other complication of vascular dialysis catheter, subsequent encounter: Secondary | ICD-10-CM | POA: Diagnosis not present

## 2015-01-13 DIAGNOSIS — N186 End stage renal disease: Secondary | ICD-10-CM | POA: Diagnosis not present

## 2015-01-13 DIAGNOSIS — D688 Other specified coagulation defects: Secondary | ICD-10-CM | POA: Diagnosis not present

## 2015-01-13 DIAGNOSIS — D509 Iron deficiency anemia, unspecified: Secondary | ICD-10-CM | POA: Diagnosis not present

## 2015-01-13 DIAGNOSIS — D631 Anemia in chronic kidney disease: Secondary | ICD-10-CM | POA: Diagnosis not present

## 2015-01-14 DIAGNOSIS — N2581 Secondary hyperparathyroidism of renal origin: Secondary | ICD-10-CM | POA: Diagnosis not present

## 2015-01-14 DIAGNOSIS — R630 Anorexia: Secondary | ICD-10-CM | POA: Diagnosis not present

## 2015-01-14 DIAGNOSIS — N039 Chronic nephritic syndrome with unspecified morphologic changes: Secondary | ICD-10-CM | POA: Diagnosis not present

## 2015-01-14 DIAGNOSIS — N186 End stage renal disease: Secondary | ICD-10-CM | POA: Diagnosis not present

## 2015-01-14 DIAGNOSIS — R634 Abnormal weight loss: Secondary | ICD-10-CM | POA: Diagnosis not present

## 2015-01-15 DIAGNOSIS — D509 Iron deficiency anemia, unspecified: Secondary | ICD-10-CM | POA: Diagnosis not present

## 2015-01-15 DIAGNOSIS — T8249XD Other complication of vascular dialysis catheter, subsequent encounter: Secondary | ICD-10-CM | POA: Diagnosis not present

## 2015-01-15 DIAGNOSIS — E876 Hypokalemia: Secondary | ICD-10-CM | POA: Diagnosis not present

## 2015-01-15 DIAGNOSIS — N186 End stage renal disease: Secondary | ICD-10-CM | POA: Diagnosis not present

## 2015-01-15 DIAGNOSIS — D631 Anemia in chronic kidney disease: Secondary | ICD-10-CM | POA: Diagnosis not present

## 2015-01-15 DIAGNOSIS — D688 Other specified coagulation defects: Secondary | ICD-10-CM | POA: Diagnosis not present

## 2015-01-18 DIAGNOSIS — D631 Anemia in chronic kidney disease: Secondary | ICD-10-CM | POA: Diagnosis not present

## 2015-01-18 DIAGNOSIS — E876 Hypokalemia: Secondary | ICD-10-CM | POA: Diagnosis not present

## 2015-01-18 DIAGNOSIS — D688 Other specified coagulation defects: Secondary | ICD-10-CM | POA: Diagnosis not present

## 2015-01-18 DIAGNOSIS — T8249XD Other complication of vascular dialysis catheter, subsequent encounter: Secondary | ICD-10-CM | POA: Diagnosis not present

## 2015-01-18 DIAGNOSIS — D509 Iron deficiency anemia, unspecified: Secondary | ICD-10-CM | POA: Diagnosis not present

## 2015-01-18 DIAGNOSIS — N186 End stage renal disease: Secondary | ICD-10-CM | POA: Diagnosis not present

## 2015-01-19 ENCOUNTER — Ambulatory Visit: Payer: Self-pay | Admitting: Internal Medicine

## 2015-01-20 DIAGNOSIS — E876 Hypokalemia: Secondary | ICD-10-CM | POA: Diagnosis not present

## 2015-01-20 DIAGNOSIS — D509 Iron deficiency anemia, unspecified: Secondary | ICD-10-CM | POA: Diagnosis not present

## 2015-01-20 DIAGNOSIS — D688 Other specified coagulation defects: Secondary | ICD-10-CM | POA: Diagnosis not present

## 2015-01-20 DIAGNOSIS — D631 Anemia in chronic kidney disease: Secondary | ICD-10-CM | POA: Diagnosis not present

## 2015-01-20 DIAGNOSIS — N186 End stage renal disease: Secondary | ICD-10-CM | POA: Diagnosis not present

## 2015-01-20 DIAGNOSIS — T8249XD Other complication of vascular dialysis catheter, subsequent encounter: Secondary | ICD-10-CM | POA: Diagnosis not present

## 2015-01-21 ENCOUNTER — Ambulatory Visit (INDEPENDENT_AMBULATORY_CARE_PROVIDER_SITE_OTHER): Payer: Medicare Other | Admitting: Internal Medicine

## 2015-01-21 ENCOUNTER — Telehealth: Payer: Self-pay | Admitting: Internal Medicine

## 2015-01-21 VITALS — BP 99/70 | HR 104 | Temp 98.5°F | Ht 61.0 in | Wt 78.3 lb

## 2015-01-21 DIAGNOSIS — F32A Depression, unspecified: Secondary | ICD-10-CM

## 2015-01-21 DIAGNOSIS — M79604 Pain in right leg: Secondary | ICD-10-CM

## 2015-01-21 DIAGNOSIS — I120 Hypertensive chronic kidney disease with stage 5 chronic kidney disease or end stage renal disease: Secondary | ICD-10-CM

## 2015-01-21 DIAGNOSIS — F329 Major depressive disorder, single episode, unspecified: Secondary | ICD-10-CM

## 2015-01-21 DIAGNOSIS — K861 Other chronic pancreatitis: Secondary | ICD-10-CM

## 2015-01-21 DIAGNOSIS — Z992 Dependence on renal dialysis: Secondary | ICD-10-CM

## 2015-01-21 DIAGNOSIS — M79605 Pain in left leg: Secondary | ICD-10-CM

## 2015-01-21 DIAGNOSIS — N186 End stage renal disease: Secondary | ICD-10-CM

## 2015-01-21 DIAGNOSIS — M549 Dorsalgia, unspecified: Secondary | ICD-10-CM

## 2015-01-21 MED ORDER — CREON 12000-38000 UNITS PO CPEP: 12000.0000 [IU] | ORAL_CAPSULE | Freq: Three times a day (TID) | ORAL | Status: DC

## 2015-01-21 MED ORDER — OMEPRAZOLE 40 MG PO CPDR: 40.0000 mg | DELAYED_RELEASE_CAPSULE | Freq: Every day | ORAL | Status: DC

## 2015-01-21 MED ORDER — RAMELTEON 8 MG PO TABS: 8.0000 mg | ORAL_TABLET | Freq: Every day | ORAL | Status: DC

## 2015-01-21 MED ORDER — LISINOPRIL 2.5 MG PO TABS: 2.5000 mg | ORAL_TABLET | Freq: Every day | ORAL | Status: DC

## 2015-01-21 MED ORDER — CYANOCOBALAMIN 250 MCG PO TABS: 250.0000 ug | ORAL_TABLET | Freq: Every evening | ORAL | Status: DC

## 2015-01-21 MED ORDER — MIRTAZAPINE 7.5 MG PO TABS: 7.5000 mg | ORAL_TABLET | Freq: Every day | ORAL | Status: DC

## 2015-01-21 MED ORDER — GABAPENTIN 300 MG PO CAPS: 300.0000 mg | ORAL_CAPSULE | Freq: Every day | ORAL | Status: DC

## 2015-01-21 MED ORDER — VITAMIN B-1 100 MG PO TABS: 100.0000 mg | ORAL_TABLET | Freq: Every day | ORAL | Status: DC

## 2015-01-21 MED ORDER — FLUOXETINE HCL 10 MG PO CAPS: 10.0000 mg | ORAL_CAPSULE | Freq: Every day | ORAL | Status: DC

## 2015-01-21 NOTE — Patient Instructions (Addendum)
Ms. Lounsbury it was nice meeting you today. Please take Gabapentin 300 mg 1 capsule by mouth everyday for pain. I want to remind you that I refilled your medications today and ordered some labs. You will be informed when the lab results come back. Remember to go for your Ankle Brachial Index test. Please return for a follow up visit in 1 month.

## 2015-01-21 NOTE — Telephone Encounter (Signed)
Shenice from [facility] called requesting the nurse to call back about pt Rx.

## 2015-01-21 NOTE — Telephone Encounter (Signed)
Call from [facility] stating pt had been getting Lisinopril 10 mg and today they received a refill for Lisinopril 2.5 mg   Pt put on Lisinopril 2.5 mg at hospital discharge 12/12/14 per [facility]'s last Rx written  01/28/2014 for Lisinopril 10 mg.  They have been filling this for her.  The last 2 months she has received Lisinopril 10 mg.  Please advise

## 2015-01-22 DIAGNOSIS — N186 End stage renal disease: Secondary | ICD-10-CM | POA: Diagnosis not present

## 2015-01-22 DIAGNOSIS — M79604 Pain in right leg: Secondary | ICD-10-CM | POA: Insufficient documentation

## 2015-01-22 DIAGNOSIS — D509 Iron deficiency anemia, unspecified: Secondary | ICD-10-CM | POA: Diagnosis not present

## 2015-01-22 DIAGNOSIS — D688 Other specified coagulation defects: Secondary | ICD-10-CM | POA: Diagnosis not present

## 2015-01-22 DIAGNOSIS — D631 Anemia in chronic kidney disease: Secondary | ICD-10-CM | POA: Diagnosis not present

## 2015-01-22 DIAGNOSIS — E876 Hypokalemia: Secondary | ICD-10-CM | POA: Diagnosis not present

## 2015-01-22 DIAGNOSIS — T8249XD Other complication of vascular dialysis catheter, subsequent encounter: Secondary | ICD-10-CM | POA: Diagnosis not present

## 2015-01-22 LAB — BASIC METABOLIC PANEL
BUN/Creatinine Ratio: 7 — ABNORMAL LOW (ref 9–23)
BUN: 16 mg/dL (ref 6–24)
CALCIUM: 8.4 mg/dL — AB (ref 8.7–10.2)
CHLORIDE: 96 mmol/L — AB (ref 97–108)
CO2: 24 mmol/L (ref 18–29)
Creatinine, Ser: 2.41 mg/dL — ABNORMAL HIGH (ref 0.57–1.00)
GFR calc Af Amer: 25 mL/min/{1.73_m2} — ABNORMAL LOW (ref >59)
GFR calc non Af Amer: 22 mL/min/{1.73_m2} — ABNORMAL LOW (ref >59)
Glucose: 128 mg/dL — ABNORMAL HIGH (ref 65–99)
Potassium: 4.9 mmol/L (ref 3.5–5.2)
Sodium: 136 mmol/L (ref 134–144)

## 2015-01-22 LAB — MAGNESIUM: MAGNESIUM: 1.9 mg/dL (ref 1.6–2.3)

## 2015-01-22 NOTE — Assessment & Plan Note (Addendum)
Patient brought in a bottle of Lisinopril 2.5 mg and requested a refill. I refilled the medication but then received a message from the pharmacy stating patient is currently receiving Lisinopril 10 mg which was prescribed last year. Lisinopril 2.5 mg was prescribed to her this year upon hospital discharge. She is ESRD on HD and nephrology note from 12/09/14 states she does not need to be on any BP meds. I discussed the matter with Dr. Beryle Beams and decision was made to stop Lisinopril. I have already sent a message to the pharmacy asking them to discontinue both the doses. I called the patient and informed her as well and she expresses her understanding.   BP stable (99/70). She had HD yesterday.

## 2015-01-22 NOTE — Progress Notes (Signed)
[name], female   DOB: 16-Mar-1956, 59 y.o.   MRN: [id number]   Subjective:   [name] female   DOB: Dec 23, 1955 59 y.o.   MRN: [id number]  HPI: [name] is a 60 y.o. F with a PMHx of ESRD on HD, chronic combined CHF, anemia of chronic disease, and pericarditis here for a hospital follow up. She was discharged from the hospital on 12/12/2014 after being treated for an infected LUE AV graft. Pt states she is doing better now and only has some mild tenderness at the site. She has finished her full course of antibiotics. Denies any swelling or erythema. No fevers, chills, chest pain, or SOB. No abdominal pain or edema of lower extremities. She goes for dialysis MWF and they are using the LUE AV graft. Today she is complaining of lower back pain that radiates her right leg x 1 week. States the pain is worse when walking. Also has some cramping in the leg. Denies any focal weakness. Denies urinary or fecal incontinence. States her daughter accidentally threw away her gabapentin prescription when she was in the hospital and as such she is having numbness and tingling in her hands and feet.     Past Medical History  Diagnosis Date  . Anemia, B12 deficiency   . History of acute pancreatitis   . Right knee pain     No recent imaging on chart  . 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 Dr. Mancel Bale, [facility]  . Hypertriglyceridemia   . GERD (gastroesophageal reflux disease)   . Subdural hematoma 02/2008    Likely 2/2 trauma from seizure from EtOH withdrawal, chronic in nature, sees Dr. Jerene Bears. Most recent CT head 10/2009 showing stable but persistent hematoma without mass effect.  . History of seizure disorder     Likely 2/2 alcohol abuse  . Hypocalcemia   . Hypomagnesemia   . Failure to thrive in childhood     Unclear etiology  . HTN (hypertension)   . Thrombocytopenia   .  Hepatomegaly     On exam  . Joint pain   . Alcohol abuse   . Vitamin D deficiency   . Pancreatitis   . Insomnia   . Hyperlipidemia   . Pernicious anemia   . Macrocytic anemia   . 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 Depression   . Fx humeral neck 04/17/2011    Transverse fracture- minimally displaced- managed as outpatient   . ABNORMAL PAP SMEAR, LGSIL 07/23/2008    Annotation: HPV positive CIN I Dr. Mancel Bale, [facility] Qualifier: Diagnosis of  By: Oretha Ellis    . Pneumonia 05/20/2012  . Arthritis     "shoulders" (08/15/2013)  . 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  Marland Kitchen Chronic diastolic CHF (congestive heart failure)   . Hypomagnesemia   . Seizures     "don't know when/why I had them; daughter was always there w/me"  . On home oxygen therapy     "3L; mostly at night" (06/19/2014)  . Shortness of breath dyspnea   . Pleural effusion    Current Outpatient Prescriptions  Medication Sig Dispense Refill  . albuterol (PROAIR HFA) 108 (90 BASE) MCG/ACT inhaler Inhale 1-2 puffs into the lungs every 6 (six) hours as needed for wheezing or shortness of breath. 1  Inhaler 1  . cetirizine (ZYRTEC) 10 MG tablet Take 1 tablet (10 mg total) by mouth daily as needed for allergies. 30 tablet 6  . CREON 12000 UNITS CPEP capsule Take 1 capsule (12,000 Units total) by mouth 3 (three) times daily before meals. 270 capsule 3  . ENSURE (ENSURE) Take 237 mLs by mouth 2 (two) times daily between meals.    Marland Kitchen FLUoxetine (PROZAC) 10 MG capsule Take 1 capsule (10 mg total) by mouth daily. For depression 90 capsule 3  . folic acid (FOLVITE) 1 MG tablet Take 1 tablet (1 mg total) by mouth daily. For folic acid replacement 30 tablet 6  . gabapentin (NEURONTIN) 300 MG capsule Take 1 capsule (300 mg total) by mouth daily. 90 capsule 0  . Lactobacillus (ACIDOPHILUS PROBIOTIC PO) Take 2 mg by mouth 3 (three) times daily. Take 2 (two) tablets by mouth  three times daily    . lisinopril (PRINIVIL,ZESTRIL) 2.5 MG tablet Take 1 tablet (2.5 mg total) by mouth daily. 90 tablet 3  . mirtazapine (REMERON) 7.5 MG tablet Take 1 tablet (7.5 mg total) by mouth at bedtime. 90 tablet 3  . Multiple Vitamin (MULTIVITAMIN WITH MINERALS) TABS tablet Take 1 tablet by mouth daily. For vitamin replacement 90 tablet 4  . Nutritional Supplements (FEEDING SUPPLEMENT, NEPRO CARB STEADY,) LIQD Take 237 mLs by mouth 3 (three) times daily with meals.  0  . omeprazole (PRILOSEC) 40 MG capsule Take 1 capsule (40 mg total) by mouth daily. 90 capsule 3  . oxyCODONE (OXY IR/ROXICODONE) 5 MG immediate release tablet Take 1 tablet (5 mg total) by mouth every 6 (six) hours as needed for moderate pain or severe pain. 20 tablet 0  . ramelteon (ROZEREM) 8 MG tablet Take 1 tablet (8 mg total) by mouth at bedtime.    . thiamine (VITAMIN B-1) 100 MG tablet Take 1 tablet (100 mg total) by mouth daily. For low thiamine 90 tablet 3  . vitamin B-12 (CYANOCOBALAMIN) 250 MCG tablet Take 1 tablet (250 mcg total) by mouth every evening. 90 tablet 3   No current facility-administered medications for this visit.   Family History  Problem Relation Age of Onset  . Cancer Mother     Died from stomach cancer and "flesh eating rash  . Heart failure Father     Died in 36s from an MI  . Alcohol abuse Sister     Twin sister drinks a lot, as did both her parents and brothers  . Stroke Brother     Has 7 brothers, 1 with CVA  . Lupus Mother    Social History   Social History  . Marital Status: Divorced    Spouse Name: N/A  . Number of Children: N/A  . Years of Education: N/A   Social History Main Topics  . Smoking status: Former Smoker -- 0.50 packs/day for 40 years    Types: Cigarettes    Quit date: 09/20/2010  . Smokeless tobacco: Never Used  . Alcohol Use: 0.0 oz/week    0 Standard drinks or equivalent per week     Comment: 12/02/14 "graduated from [employer] (for alcohol  abuse) in October 2015"   . Drug Use: Yes    Special: Marijuana, Cocaine     Comment: 12/02/14 "last drug use was in 2012"  . Sexual Activity: Not Currently   Other Topics Concern  . Not on file   Social History Narrative   Lives with her significant other and 2  grandchildren. 1 child   Has 7 brothers and 4 sisters, 1 twin sister.   Unemployed, worked in [employer].    Abuses alcohol-drinks 1 glass of wine daily    No drug use. Former cigarette use quit 1.5 years ago.     11 th grade education            Review of Systems: Review of Systems  Constitutional: Negative for fever and chills.  HENT: Negative for ear pain.   Eyes: Negative for blurred vision and pain.  Respiratory: Negative for cough, shortness of breath and wheezing.   Cardiovascular: Negative for chest pain and leg swelling.  Gastrointestinal: Negative for nausea, vomiting, abdominal pain, diarrhea and constipation.  Genitourinary: Negative for dysuria, urgency and frequency.  Musculoskeletal: Positive for back pain.       Cramps  Skin: Negative for itching and rash.  Neurological: Positive for tingling and sensory change. Negative for dizziness, focal weakness and headaches.     Objective:  Physical Exam: Filed Vitals:   01/21/15 0844  BP: 99/70  Pulse: 104  Temp: 98.5 F (36.9 C)  TempSrc: Oral  Height: 5\' 1"  (1.549 m)  Weight: 78 lb 4.8 oz (35.517 kg)  SpO2: 100%   Physical Exam  Constitutional: She is oriented to person, place, and time. She appears well-developed and well-nourished. No distress.  HENT:  Head: Normocephalic and atraumatic.  Eyes: EOM are normal. Pupils are equal, round, and reactive to light.  Neck: Neck supple. No tracheal deviation present.  Cardiovascular: Normal rate, regular rhythm and intact distal pulses.   Pulmonary/Chest: Effort normal. No respiratory distress. She has no wheezes. She has no rales.  Abdominal: Soft. Bowel sounds are normal. She exhibits no distension.  There is no tenderness.  Musculoskeletal: Normal range of motion. She exhibits no edema.  LUE: AV fistula site is not tender to palpation. No erythema, increased warmth, or swelling. Palpable thrill present and bruit auscultated.   R upper chest: permanent dialysis catheter covered with bandage which was c/d/i.   Lower back: positive for mild CVA tenderness b/l   Straight leg raise test negative b/l  Neurological: She is alert and oriented to person, place, and time. No cranial nerve deficit.  Strength and sensation grossly intact in b/l LEs  Skin: Skin is warm and dry. No rash noted. No erythema.     Assessment & Plan:

## 2015-01-22 NOTE — Assessment & Plan Note (Signed)
LUE: AV fistula site is not tender to palpation. No erythema, increased warmth, or swelling. Palpable thrill present and bruit auscultated. No fevers, chills, nausea, or vomiting. No signs of active infection.

## 2015-01-22 NOTE — Telephone Encounter (Signed)
Patient brought in a bottle of Lisinopril 2.5 mg and requested a refill. However, she goes for HD and nephrology note from 12/09/14 states she does not need to be on any BP meds.  As such, please discontinue both doses of Lisinopril (2.5 mg and 10 mg). Thanks.

## 2015-01-22 NOTE — Telephone Encounter (Signed)
Pharmacy called and lisinopril has been stopped. Pt informed by Dr Marlowe Sax

## 2015-01-22 NOTE — Assessment & Plan Note (Signed)
Creon refill today.

## 2015-01-22 NOTE — Assessment & Plan Note (Signed)
Prozac refill today.

## 2015-01-22 NOTE — Assessment & Plan Note (Addendum)
Complaining of back pain that radiates to her right leg. Pain worse with walking. Physical exam was normal - skin warm, pulses intact, no hair loss. PAD less likely but has to be ruled out. Not likely sciatica because straight leg raise test was negative bilaterally. Not likely spinal cord compression because no urinary or fecal incontinence, no focal weakness. Strength and sensation grossly intact on physical exam. She did have mild CVA tenderness bilaterally, most likely due to her end-stage renal disease. -ABI ordered today to r/o peripheral arterial disease  -Prescribed gabapentin 300 mg daily for LE pain and numbness and tingling of hands and feet -Reassess her pain during follow-up visit in 1 month. -Cramps could be due to hypokalemia. Follow-up BMP and magnesium.  Addendum: K normal (4.9) and Mag normal (1.9). Cramps not likely related to hypokalemia.

## 2015-01-23 DIAGNOSIS — J9620 Acute and chronic respiratory failure, unspecified whether with hypoxia or hypercapnia: Secondary | ICD-10-CM | POA: Diagnosis not present

## 2015-01-25 DIAGNOSIS — D631 Anemia in chronic kidney disease: Secondary | ICD-10-CM | POA: Diagnosis not present

## 2015-01-25 DIAGNOSIS — D509 Iron deficiency anemia, unspecified: Secondary | ICD-10-CM | POA: Diagnosis not present

## 2015-01-25 DIAGNOSIS — N186 End stage renal disease: Secondary | ICD-10-CM | POA: Diagnosis not present

## 2015-01-25 DIAGNOSIS — D688 Other specified coagulation defects: Secondary | ICD-10-CM | POA: Diagnosis not present

## 2015-01-25 DIAGNOSIS — E876 Hypokalemia: Secondary | ICD-10-CM | POA: Diagnosis not present

## 2015-01-25 DIAGNOSIS — T8249XD Other complication of vascular dialysis catheter, subsequent encounter: Secondary | ICD-10-CM | POA: Diagnosis not present

## 2015-01-27 DIAGNOSIS — E876 Hypokalemia: Secondary | ICD-10-CM | POA: Diagnosis not present

## 2015-01-27 DIAGNOSIS — T8249XD Other complication of vascular dialysis catheter, subsequent encounter: Secondary | ICD-10-CM | POA: Diagnosis not present

## 2015-01-27 DIAGNOSIS — D688 Other specified coagulation defects: Secondary | ICD-10-CM | POA: Diagnosis not present

## 2015-01-27 DIAGNOSIS — D631 Anemia in chronic kidney disease: Secondary | ICD-10-CM | POA: Diagnosis not present

## 2015-01-27 DIAGNOSIS — N186 End stage renal disease: Secondary | ICD-10-CM | POA: Diagnosis not present

## 2015-01-27 DIAGNOSIS — D509 Iron deficiency anemia, unspecified: Secondary | ICD-10-CM | POA: Diagnosis not present

## 2015-01-28 NOTE — Progress Notes (Signed)
Internal Medicine Clinic Attending  I saw and evaluated the patient.  I personally confirmed the key portions of the history and exam documented by Dr. Rathore and I reviewed pertinent patient test results.  The assessment, diagnosis, and plan were formulated together and I agree with the documentation in the resident's note.  

## 2015-01-29 DIAGNOSIS — D688 Other specified coagulation defects: Secondary | ICD-10-CM | POA: Diagnosis not present

## 2015-01-29 DIAGNOSIS — N186 End stage renal disease: Secondary | ICD-10-CM | POA: Diagnosis not present

## 2015-01-29 DIAGNOSIS — E876 Hypokalemia: Secondary | ICD-10-CM | POA: Diagnosis not present

## 2015-01-29 DIAGNOSIS — D509 Iron deficiency anemia, unspecified: Secondary | ICD-10-CM | POA: Diagnosis not present

## 2015-01-29 DIAGNOSIS — T8249XD Other complication of vascular dialysis catheter, subsequent encounter: Secondary | ICD-10-CM | POA: Diagnosis not present

## 2015-01-29 DIAGNOSIS — D631 Anemia in chronic kidney disease: Secondary | ICD-10-CM | POA: Diagnosis not present

## 2015-02-01 DIAGNOSIS — T8249XD Other complication of vascular dialysis catheter, subsequent encounter: Secondary | ICD-10-CM | POA: Diagnosis not present

## 2015-02-01 DIAGNOSIS — D509 Iron deficiency anemia, unspecified: Secondary | ICD-10-CM | POA: Diagnosis not present

## 2015-02-01 DIAGNOSIS — N186 End stage renal disease: Secondary | ICD-10-CM | POA: Diagnosis not present

## 2015-02-01 DIAGNOSIS — E876 Hypokalemia: Secondary | ICD-10-CM | POA: Diagnosis not present

## 2015-02-01 DIAGNOSIS — D688 Other specified coagulation defects: Secondary | ICD-10-CM | POA: Diagnosis not present

## 2015-02-01 DIAGNOSIS — D631 Anemia in chronic kidney disease: Secondary | ICD-10-CM | POA: Diagnosis not present

## 2015-02-02 DIAGNOSIS — N2581 Secondary hyperparathyroidism of renal origin: Secondary | ICD-10-CM | POA: Diagnosis not present

## 2015-02-02 DIAGNOSIS — N186 End stage renal disease: Secondary | ICD-10-CM | POA: Diagnosis not present

## 2015-02-02 DIAGNOSIS — R634 Abnormal weight loss: Secondary | ICD-10-CM | POA: Diagnosis not present

## 2015-02-02 DIAGNOSIS — R630 Anorexia: Secondary | ICD-10-CM | POA: Diagnosis not present

## 2015-02-02 DIAGNOSIS — N039 Chronic nephritic syndrome with unspecified morphologic changes: Secondary | ICD-10-CM | POA: Diagnosis not present

## 2015-02-03 DIAGNOSIS — T8249XD Other complication of vascular dialysis catheter, subsequent encounter: Secondary | ICD-10-CM | POA: Diagnosis not present

## 2015-02-03 DIAGNOSIS — D688 Other specified coagulation defects: Secondary | ICD-10-CM | POA: Diagnosis not present

## 2015-02-03 DIAGNOSIS — N186 End stage renal disease: Secondary | ICD-10-CM | POA: Diagnosis not present

## 2015-02-03 DIAGNOSIS — Z992 Dependence on renal dialysis: Secondary | ICD-10-CM | POA: Diagnosis not present

## 2015-02-03 DIAGNOSIS — N031 Chronic nephritic syndrome with focal and segmental glomerular lesions: Secondary | ICD-10-CM | POA: Diagnosis not present

## 2015-02-03 DIAGNOSIS — E876 Hypokalemia: Secondary | ICD-10-CM | POA: Diagnosis not present

## 2015-02-03 DIAGNOSIS — D631 Anemia in chronic kidney disease: Secondary | ICD-10-CM | POA: Diagnosis not present

## 2015-02-03 DIAGNOSIS — D509 Iron deficiency anemia, unspecified: Secondary | ICD-10-CM | POA: Diagnosis not present

## 2015-02-05 DIAGNOSIS — Z111 Encounter for screening for respiratory tuberculosis: Secondary | ICD-10-CM | POA: Diagnosis not present

## 2015-02-05 DIAGNOSIS — D631 Anemia in chronic kidney disease: Secondary | ICD-10-CM | POA: Diagnosis not present

## 2015-02-05 DIAGNOSIS — E876 Hypokalemia: Secondary | ICD-10-CM | POA: Diagnosis not present

## 2015-02-05 DIAGNOSIS — D509 Iron deficiency anemia, unspecified: Secondary | ICD-10-CM | POA: Diagnosis not present

## 2015-02-05 DIAGNOSIS — N186 End stage renal disease: Secondary | ICD-10-CM | POA: Diagnosis not present

## 2015-02-05 DIAGNOSIS — T8249XD Other complication of vascular dialysis catheter, subsequent encounter: Secondary | ICD-10-CM | POA: Diagnosis not present

## 2015-02-05 DIAGNOSIS — D688 Other specified coagulation defects: Secondary | ICD-10-CM | POA: Diagnosis not present

## 2015-02-08 DIAGNOSIS — Z111 Encounter for screening for respiratory tuberculosis: Secondary | ICD-10-CM | POA: Diagnosis not present

## 2015-02-08 DIAGNOSIS — T8249XD Other complication of vascular dialysis catheter, subsequent encounter: Secondary | ICD-10-CM | POA: Diagnosis not present

## 2015-02-08 DIAGNOSIS — D631 Anemia in chronic kidney disease: Secondary | ICD-10-CM | POA: Diagnosis not present

## 2015-02-08 DIAGNOSIS — D688 Other specified coagulation defects: Secondary | ICD-10-CM | POA: Diagnosis not present

## 2015-02-08 DIAGNOSIS — E876 Hypokalemia: Secondary | ICD-10-CM | POA: Diagnosis not present

## 2015-02-08 DIAGNOSIS — D509 Iron deficiency anemia, unspecified: Secondary | ICD-10-CM | POA: Diagnosis not present

## 2015-02-08 DIAGNOSIS — N186 End stage renal disease: Secondary | ICD-10-CM | POA: Diagnosis not present

## 2015-02-09 DIAGNOSIS — Z452 Encounter for adjustment and management of vascular access device: Secondary | ICD-10-CM | POA: Diagnosis not present

## 2015-02-10 DIAGNOSIS — D688 Other specified coagulation defects: Secondary | ICD-10-CM | POA: Diagnosis not present

## 2015-02-10 DIAGNOSIS — D509 Iron deficiency anemia, unspecified: Secondary | ICD-10-CM | POA: Diagnosis not present

## 2015-02-10 DIAGNOSIS — Z111 Encounter for screening for respiratory tuberculosis: Secondary | ICD-10-CM | POA: Diagnosis not present

## 2015-02-10 DIAGNOSIS — D631 Anemia in chronic kidney disease: Secondary | ICD-10-CM | POA: Diagnosis not present

## 2015-02-10 DIAGNOSIS — N186 End stage renal disease: Secondary | ICD-10-CM | POA: Diagnosis not present

## 2015-02-10 DIAGNOSIS — T8249XD Other complication of vascular dialysis catheter, subsequent encounter: Secondary | ICD-10-CM | POA: Diagnosis not present

## 2015-02-10 DIAGNOSIS — E876 Hypokalemia: Secondary | ICD-10-CM | POA: Diagnosis not present

## 2015-02-11 ENCOUNTER — Encounter: Payer: Self-pay | Admitting: Internal Medicine

## 2015-02-11 ENCOUNTER — Ambulatory Visit (INDEPENDENT_AMBULATORY_CARE_PROVIDER_SITE_OTHER): Payer: Medicare Other | Admitting: Internal Medicine

## 2015-02-11 ENCOUNTER — Ambulatory Visit (HOSPITAL_COMMUNITY): Admission: RE | Admit: 2015-02-11 | Payer: Medicare Other | Source: Ambulatory Visit

## 2015-02-11 ENCOUNTER — Ambulatory Visit (HOSPITAL_COMMUNITY)
Admission: RE | Admit: 2015-02-11 | Discharge: 2015-02-11 | Disposition: A | Discharge status: Home / Self Care | Payer: Medicare Other | Source: Ambulatory Visit | Attending: Internal Medicine | Admitting: Internal Medicine

## 2015-02-11 VITALS — BP 109/72 | HR 101 | Temp 98.5°F | Ht 61.0 in | Wt 79.5 lb

## 2015-02-11 DIAGNOSIS — Z23 Encounter for immunization: Secondary | ICD-10-CM | POA: Diagnosis not present

## 2015-02-11 DIAGNOSIS — R9431 Abnormal electrocardiogram [ECG] [EKG]: Secondary | ICD-10-CM | POA: Diagnosis not present

## 2015-02-11 DIAGNOSIS — N186 End stage renal disease: Secondary | ICD-10-CM

## 2015-02-11 DIAGNOSIS — I071 Rheumatic tricuspid insufficiency: Secondary | ICD-10-CM | POA: Diagnosis not present

## 2015-02-11 DIAGNOSIS — I129 Hypertensive chronic kidney disease with stage 1 through stage 4 chronic kidney disease, or unspecified chronic kidney disease: Secondary | ICD-10-CM

## 2015-02-11 DIAGNOSIS — Z992 Dependence on renal dialysis: Secondary | ICD-10-CM

## 2015-02-11 DIAGNOSIS — R634 Abnormal weight loss: Secondary | ICD-10-CM

## 2015-02-11 NOTE — Patient Instructions (Signed)
STOP taking all blood pressure medications including Amlodipine (Norvasc )  Hypotension As your heart beats, it forces blood through your arteries. This force is your blood pressure. If your blood pressure is too low for you to go about your normal activities or to support the organs of your body, you have hypotension. Hypotension is also referred to as low blood pressure. When your blood pressure becomes too low, you may not get enough blood to your brain. As a result, you may feel weak, feel lightheaded, or develop a rapid heart rate. In a more severe case, you may faint. CAUSES Various conditions can cause hypotension. These include:  Blood loss.  Dehydration.  Heart or endocrine problems.  Pregnancy.  Severe infection.  Not having a well-balanced diet filled with needed nutrients.  Severe allergic reactions (anaphylaxis). Some medicines, such as blood pressure medicine or water pills (diuretics), may lower your blood pressure below normal. Sometimes taking too much medicine or taking medicine not as directed can cause hypotension. TREATMENT  Hospitalization is sometimes required for hypotension if fluid or blood replacement is needed, if time is needed for medicines to wear off, or if further monitoring is needed. Treatment might include changing your diet, changing your medicines (including medicines aimed at raising your blood pressure), and use of support stockings. HOME CARE INSTRUCTIONS   Drink enough fluids to keep your urine clear or pale yellow.  Take your medicines as directed by your health care provider.  Get up slowly from reclining or sitting positions. This gives your blood pressure a chance to adjust.  Wear support stockings as directed by your health care provider.  Maintain a healthy diet by including nutritious food, such as fruits, vegetables, nuts, whole grains, and lean meats. SEEK MEDICAL CARE IF:  You have vomiting or diarrhea.  You have a fever for  more than 2-3 days.  You feel more thirsty than usual.  You feel weak and tired. SEEK IMMEDIATE MEDICAL CARE IF:   You have chest pain or a fast or irregular heartbeat.  You have a loss of feeling in some part of your body, or you lose movement in your arms or legs.  You have trouble speaking.  You become sweaty or feel lightheaded.  You faint. MAKE SURE YOU:   Understand these instructions.  Will watch your condition.  Will get help right away if you are not doing well or get worse. Document Released: 05/22/2005 Document Revised: 03/12/2013 Document Reviewed: 11/22/2012 [facility] Patient Information 2015 [place], [place]. This information is not intended to replace advice given to you by your health care provider. Make sure you discuss any questions you have with your health care provider.

## 2015-02-11 NOTE — Assessment & Plan Note (Signed)
BP Readings from Last 3 Encounters:  02/11/15 109/72  01/21/15 99/70  12/22/14 123/89    Lab Results  Component Value Date   NA 136 01/21/2015   K 4.9 01/21/2015   CREATININE 2.41* 01/21/2015    Assessment: Blood pressure control:  controlled  Progress toward BP goal:   at goal Comments: Pt should not be on any BP meds since started HD. She has still been taking norvasc. She gets dizzy and faint during HD session and will get dizzy upon standing while at home.   Plan: Medications: stop norvasc.  Educational resources provided: brochure (denied) Self management tools provided: home blood pressure logbook Other plans: RTC in 2 weeks for BP check

## 2015-02-11 NOTE — Assessment & Plan Note (Addendum)
Pt has a new 3/6 holosystolic at left sternal border that radiates across chest, likely due to tricuspid regurgitation. She is asymptomatic, denies SOB although uses home O2 at night which is not new and no leg edema on exam although she is getting HD with subsequent hypotension. ECHO on 09/2014 revealed mild tricuspid regurgitation w/ systolic pulmonary artery pressure 42mmHg indicating mild pulmonary htn. Possible that her pulmonary hypertension has worsened causing audible murmur as there is no documented murmur on PE in EPIC. She denies IVDU, thus unlikely bacterial endocarditis. She has previously been worked up for interstitial lung disease w/ a high resolution CT chest in 08/2013 which revealed multifocal infectious/inflammatory process including multifocal bronchopneumonia, atypical infection, or pneumonitis such as nonspecific interstitial pneumonitis (NSIP). PFTs were ordered but never done. ANA and dsDNA both positive when tested twice each. RF and HIV negative. She was previously referred to rheumatology but did not show up for the appt.  - EKG ordered, evidence of rt and lt atrial enlargement unchanged from previous EKG on 12/06/2014 - ECHO ordered - can reorder PFTs at next visit as well as check CMET for liver function to further investigate pulmonary htn which at this time seems like group 1 PAH however would need a rt heart cath for definitive determination. - can discuss rheum referral at next visit

## 2015-02-11 NOTE — Progress Notes (Signed)
   Subjective:    Patient ID: [name], female    DOB: [birth year], 59 y.o.   MRN: [id number]  HPI 59 y/o F w/ PMHx of CHF, GERD, HTN, ESRD on HD, and 2/2 hyperparathyroidism who presents to clinic for BP check. Please see problem list for further details.      Review of Systems  Constitutional: Positive for unexpected weight change. Negative for fever, chills and appetite change.  Respiratory: Positive for shortness of breath (chronic and unchanged, wears oxygen at night ).   Cardiovascular: Negative for chest pain.  Gastrointestinal: Positive for nausea (w/dialysis). Negative for blood in stool and abdominal distention.  Neurological: Positive for dizziness.       Objective:   Physical Exam  Constitutional: She appears well-developed.  Thin   Cardiovascular: Normal rate and regular rhythm.   Murmur (3/6 holosystolic mumur at left upper sternal border that radiates across chest) heard. Pulmonary/Chest: Breath sounds normal. She is in respiratory distress. She has no wheezes. She has no rales.  Abdominal: Soft. Bowel sounds are normal. She exhibits no distension. There is no tenderness.  Neurological: She is alert.  Skin: Skin is warm and dry.          Assessment & Plan:  Please see problem based assessment and plan.

## 2015-02-12 DIAGNOSIS — D688 Other specified coagulation defects: Secondary | ICD-10-CM | POA: Diagnosis not present

## 2015-02-12 DIAGNOSIS — N186 End stage renal disease: Secondary | ICD-10-CM | POA: Diagnosis not present

## 2015-02-12 DIAGNOSIS — E876 Hypokalemia: Secondary | ICD-10-CM | POA: Diagnosis not present

## 2015-02-12 DIAGNOSIS — T8249XD Other complication of vascular dialysis catheter, subsequent encounter: Secondary | ICD-10-CM | POA: Diagnosis not present

## 2015-02-12 DIAGNOSIS — R634 Abnormal weight loss: Secondary | ICD-10-CM | POA: Insufficient documentation

## 2015-02-12 DIAGNOSIS — D509 Iron deficiency anemia, unspecified: Secondary | ICD-10-CM | POA: Diagnosis not present

## 2015-02-12 DIAGNOSIS — D631 Anemia in chronic kidney disease: Secondary | ICD-10-CM | POA: Diagnosis not present

## 2015-02-12 DIAGNOSIS — Z111 Encounter for screening for respiratory tuberculosis: Secondary | ICD-10-CM | POA: Diagnosis not present

## 2015-02-12 NOTE — Assessment & Plan Note (Signed)
Pt complaining of unintentional weight loss w/o change in appetite. She weighs 79lbs today and through chart review has fluctuated btwn 100lbs down to around 84lbs. Denies night sweats and fevers, She just recently started HD which is the most likely reason for her weight loss at this time. Could also be due to lupus which has never been formally evaluated, she has been referred to rheum but did not show up for her appt. Malignancy is another consideration, however imaging up to date has been negative for malignancy. Will continue to monitor.

## 2015-02-15 DIAGNOSIS — Z111 Encounter for screening for respiratory tuberculosis: Secondary | ICD-10-CM | POA: Diagnosis not present

## 2015-02-15 DIAGNOSIS — D631 Anemia in chronic kidney disease: Secondary | ICD-10-CM | POA: Diagnosis not present

## 2015-02-15 DIAGNOSIS — D509 Iron deficiency anemia, unspecified: Secondary | ICD-10-CM | POA: Diagnosis not present

## 2015-02-15 DIAGNOSIS — D688 Other specified coagulation defects: Secondary | ICD-10-CM | POA: Diagnosis not present

## 2015-02-15 DIAGNOSIS — E876 Hypokalemia: Secondary | ICD-10-CM | POA: Diagnosis not present

## 2015-02-15 DIAGNOSIS — N186 End stage renal disease: Secondary | ICD-10-CM | POA: Diagnosis not present

## 2015-02-15 DIAGNOSIS — T8249XD Other complication of vascular dialysis catheter, subsequent encounter: Secondary | ICD-10-CM | POA: Diagnosis not present

## 2015-02-16 NOTE — Progress Notes (Signed)
Internal Medicine Clinic Attending  Case discussed with Dr. Truong at the time of the visit.  We reviewed the resident's history and exam and pertinent patient test results.  I agree with the assessment, diagnosis, and plan of care documented in the resident's note.  

## 2015-02-17 DIAGNOSIS — D509 Iron deficiency anemia, unspecified: Secondary | ICD-10-CM | POA: Diagnosis not present

## 2015-02-17 DIAGNOSIS — D631 Anemia in chronic kidney disease: Secondary | ICD-10-CM | POA: Diagnosis not present

## 2015-02-17 DIAGNOSIS — T8249XD Other complication of vascular dialysis catheter, subsequent encounter: Secondary | ICD-10-CM | POA: Diagnosis not present

## 2015-02-17 DIAGNOSIS — N186 End stage renal disease: Secondary | ICD-10-CM | POA: Diagnosis not present

## 2015-02-17 DIAGNOSIS — Z111 Encounter for screening for respiratory tuberculosis: Secondary | ICD-10-CM | POA: Diagnosis not present

## 2015-02-17 DIAGNOSIS — D688 Other specified coagulation defects: Secondary | ICD-10-CM | POA: Diagnosis not present

## 2015-02-17 DIAGNOSIS — E876 Hypokalemia: Secondary | ICD-10-CM | POA: Diagnosis not present

## 2015-02-19 DIAGNOSIS — N186 End stage renal disease: Secondary | ICD-10-CM | POA: Diagnosis not present

## 2015-02-19 DIAGNOSIS — D688 Other specified coagulation defects: Secondary | ICD-10-CM | POA: Diagnosis not present

## 2015-02-19 DIAGNOSIS — T8249XD Other complication of vascular dialysis catheter, subsequent encounter: Secondary | ICD-10-CM | POA: Diagnosis not present

## 2015-02-19 DIAGNOSIS — Z111 Encounter for screening for respiratory tuberculosis: Secondary | ICD-10-CM | POA: Diagnosis not present

## 2015-02-19 DIAGNOSIS — D509 Iron deficiency anemia, unspecified: Secondary | ICD-10-CM | POA: Diagnosis not present

## 2015-02-19 DIAGNOSIS — E876 Hypokalemia: Secondary | ICD-10-CM | POA: Diagnosis not present

## 2015-02-19 DIAGNOSIS — D631 Anemia in chronic kidney disease: Secondary | ICD-10-CM | POA: Diagnosis not present

## 2015-02-22 DIAGNOSIS — E876 Hypokalemia: Secondary | ICD-10-CM | POA: Diagnosis not present

## 2015-02-22 DIAGNOSIS — N186 End stage renal disease: Secondary | ICD-10-CM | POA: Diagnosis not present

## 2015-02-22 DIAGNOSIS — D688 Other specified coagulation defects: Secondary | ICD-10-CM | POA: Diagnosis not present

## 2015-02-22 DIAGNOSIS — D631 Anemia in chronic kidney disease: Secondary | ICD-10-CM | POA: Diagnosis not present

## 2015-02-22 DIAGNOSIS — D509 Iron deficiency anemia, unspecified: Secondary | ICD-10-CM | POA: Diagnosis not present

## 2015-02-22 DIAGNOSIS — Z111 Encounter for screening for respiratory tuberculosis: Secondary | ICD-10-CM | POA: Diagnosis not present

## 2015-02-22 DIAGNOSIS — T8249XD Other complication of vascular dialysis catheter, subsequent encounter: Secondary | ICD-10-CM | POA: Diagnosis not present

## 2015-02-23 DIAGNOSIS — J9620 Acute and chronic respiratory failure, unspecified whether with hypoxia or hypercapnia: Secondary | ICD-10-CM | POA: Diagnosis not present

## 2015-02-24 DIAGNOSIS — E876 Hypokalemia: Secondary | ICD-10-CM | POA: Diagnosis not present

## 2015-02-24 DIAGNOSIS — D688 Other specified coagulation defects: Secondary | ICD-10-CM | POA: Diagnosis not present

## 2015-02-24 DIAGNOSIS — T8249XD Other complication of vascular dialysis catheter, subsequent encounter: Secondary | ICD-10-CM | POA: Diagnosis not present

## 2015-02-24 DIAGNOSIS — N186 End stage renal disease: Secondary | ICD-10-CM | POA: Diagnosis not present

## 2015-02-24 DIAGNOSIS — D631 Anemia in chronic kidney disease: Secondary | ICD-10-CM | POA: Diagnosis not present

## 2015-02-24 DIAGNOSIS — D509 Iron deficiency anemia, unspecified: Secondary | ICD-10-CM | POA: Diagnosis not present

## 2015-02-24 DIAGNOSIS — Z111 Encounter for screening for respiratory tuberculosis: Secondary | ICD-10-CM | POA: Diagnosis not present

## 2015-02-26 DIAGNOSIS — E876 Hypokalemia: Secondary | ICD-10-CM | POA: Diagnosis not present

## 2015-02-26 DIAGNOSIS — N186 End stage renal disease: Secondary | ICD-10-CM | POA: Diagnosis not present

## 2015-02-26 DIAGNOSIS — T8249XD Other complication of vascular dialysis catheter, subsequent encounter: Secondary | ICD-10-CM | POA: Diagnosis not present

## 2015-02-26 DIAGNOSIS — Z111 Encounter for screening for respiratory tuberculosis: Secondary | ICD-10-CM | POA: Diagnosis not present

## 2015-02-26 DIAGNOSIS — D631 Anemia in chronic kidney disease: Secondary | ICD-10-CM | POA: Diagnosis not present

## 2015-02-26 DIAGNOSIS — D688 Other specified coagulation defects: Secondary | ICD-10-CM | POA: Diagnosis not present

## 2015-02-26 DIAGNOSIS — D509 Iron deficiency anemia, unspecified: Secondary | ICD-10-CM | POA: Diagnosis not present

## 2015-03-01 DIAGNOSIS — D688 Other specified coagulation defects: Secondary | ICD-10-CM | POA: Diagnosis not present

## 2015-03-01 DIAGNOSIS — E876 Hypokalemia: Secondary | ICD-10-CM | POA: Diagnosis not present

## 2015-03-01 DIAGNOSIS — D631 Anemia in chronic kidney disease: Secondary | ICD-10-CM | POA: Diagnosis not present

## 2015-03-01 DIAGNOSIS — T8249XD Other complication of vascular dialysis catheter, subsequent encounter: Secondary | ICD-10-CM | POA: Diagnosis not present

## 2015-03-01 DIAGNOSIS — Z111 Encounter for screening for respiratory tuberculosis: Secondary | ICD-10-CM | POA: Diagnosis not present

## 2015-03-01 DIAGNOSIS — N186 End stage renal disease: Secondary | ICD-10-CM | POA: Diagnosis not present

## 2015-03-01 DIAGNOSIS — D509 Iron deficiency anemia, unspecified: Secondary | ICD-10-CM | POA: Diagnosis not present

## 2015-03-02 ENCOUNTER — Ambulatory Visit (INDEPENDENT_AMBULATORY_CARE_PROVIDER_SITE_OTHER): Payer: Medicare Other | Admitting: Internal Medicine

## 2015-03-02 ENCOUNTER — Encounter: Payer: Self-pay | Admitting: Internal Medicine

## 2015-03-02 VITALS — BP 101/73 | HR 85 | Temp 98.0°F | Ht 61.0 in | Wt 79.4 lb

## 2015-03-02 DIAGNOSIS — Z87891 Personal history of nicotine dependence: Secondary | ICD-10-CM | POA: Diagnosis not present

## 2015-03-02 DIAGNOSIS — I10 Essential (primary) hypertension: Secondary | ICD-10-CM | POA: Diagnosis not present

## 2015-03-02 DIAGNOSIS — I071 Rheumatic tricuspid insufficiency: Secondary | ICD-10-CM

## 2015-03-02 DIAGNOSIS — M329 Systemic lupus erythematosus, unspecified: Secondary | ICD-10-CM

## 2015-03-02 NOTE — Progress Notes (Signed)
Subjective:    [name], female    DOB: 1956/02/09, 59 y.o.   MRN: [id number]  HPI [name] [name] is a 59 y.o. female with PMHx of HTN and tricuspid regurgitation who presents to the clinic for follow up on her HTN. Please see A&P for the status of the patient's chronic medical problems.   Past Medical History  Diagnosis Date  . Anemia, B12 deficiency   . History of acute pancreatitis   . Right knee pain     No recent imaging on chart  . 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 Dr. Mancel Bale, [facility]  . Hypertriglyceridemia   . GERD (gastroesophageal reflux disease)   . Subdural hematoma 02/2008    Likely 2/2 trauma from seizure from EtOH withdrawal, chronic in nature, sees Dr. Jerene Bears. Most recent CT head 10/2009 showing stable but persistent hematoma without mass effect.  . History of seizure disorder     Likely 2/2 alcohol abuse  . Hypocalcemia   . Hypomagnesemia   . Failure to thrive in childhood     Unclear etiology  . HTN (hypertension)   . Thrombocytopenia   . Hepatomegaly     On exam  . Joint pain   . Alcohol abuse   . Vitamin D deficiency   . Pancreatitis   . Insomnia   . Hyperlipidemia   . Pernicious anemia   . Macrocytic anemia   . 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 Depression   . Fx humeral neck 04/17/2011    Transverse fracture- minimally displaced- managed as outpatient   . ABNORMAL PAP SMEAR, LGSIL 07/23/2008    Annotation: HPV positive CIN I Dr. Mancel Bale, [facility] Qualifier: Diagnosis of  By: Oretha Ellis    . Pneumonia 05/20/2012  . Arthritis     "shoulders" (08/15/2013)  . 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  Marland Kitchen Chronic diastolic CHF (congestive heart failure)   . Hypomagnesemia   . Seizures     "don't know when/why I had them; daughter was always there w/me"  . On home oxygen therapy     "3L;  mostly at night" (06/19/2014)  . Shortness of breath dyspnea   . Pleural effusion     Outpatient Encounter Prescriptions as of 03/02/2015  Medication Sig  . albuterol (PROAIR HFA) 108 (90 BASE) MCG/ACT inhaler Inhale 1-2 puffs into the lungs every 6 (six) hours as needed for wheezing or shortness of breath.  . cetirizine (ZYRTEC) 10 MG tablet Take 1 tablet (10 mg total) by mouth daily as needed for allergies.  Marland Kitchen CREON 12000 UNITS CPEP capsule Take 1 capsule (12,000 Units total) by mouth 3 (three) times daily before meals.  . ENSURE (ENSURE) Take 237 mLs by mouth 2 (two) times daily between meals.  Marland Kitchen FLUoxetine (PROZAC) 10 MG capsule Take 1 capsule (10 mg total) by mouth daily. For depression  . folic acid (FOLVITE) 1 MG tablet Take 1 tablet (1 mg total) by mouth daily. For folic acid replacement  . gabapentin (NEURONTIN) 300 MG capsule Take 1 capsule (300 mg total) by mouth daily.  . Lactobacillus (ACIDOPHILUS PROBIOTIC PO) Take 2 mg by mouth 3 (three) times daily. Take 2 (two) tablets by mouth three times daily  . mirtazapine (REMERON) 7.5 MG tablet Take 1 tablet (7.5 mg total) by mouth  at bedtime.  . Multiple Vitamin (MULTIVITAMIN WITH MINERALS) TABS tablet Take 1 tablet by mouth daily. For vitamin replacement  . Nutritional Supplements (FEEDING SUPPLEMENT, NEPRO CARB STEADY,) LIQD Take 237 mLs by mouth 3 (three) times daily with meals.  Marland Kitchen omeprazole (PRILOSEC) 40 MG capsule Take 1 capsule (40 mg total) by mouth daily.  . ramelteon (ROZEREM) 8 MG tablet Take 1 tablet (8 mg total) by mouth at bedtime.  . thiamine (VITAMIN B-1) 100 MG tablet Take 1 tablet (100 mg total) by mouth daily. For low thiamine  . vitamin B-12 (CYANOCOBALAMIN) 250 MCG tablet Take 1 tablet (250 mcg total) by mouth every evening.   No facility-administered encounter medications on file as of 03/02/2015.    Family History  Problem Relation Age of Onset  . Cancer Mother     Died from stomach cancer and "flesh eating  rash  . Heart failure Father     Died in 17s from an MI  . Alcohol abuse Sister     Twin sister drinks a lot, as did both her parents and brothers  . Stroke Brother     Has 7 brothers, 1 with CVA  . Lupus Mother     Social History   Social History  . Marital Status: Divorced    Spouse Name: N/A  . Number of Children: N/A  . Years of Education: N/A   Occupational History  . Not on file.   Social History Main Topics  . Smoking status: Former Smoker -- 0.50 packs/day for 40 years    Types: Cigarettes    Quit date: 09/20/2010  . Smokeless tobacco: Never Used  . Alcohol Use: 0.0 oz/week    0 Standard drinks or equivalent per week     Comment: 12/02/14 "graduated from [employer] (for alcohol abuse) in October 2015"   . Drug Use: Yes    Special: Marijuana, Cocaine     Comment: 12/02/14 "last drug use was in 2012"  . Sexual Activity: Not Currently   Other Topics Concern  . Not on file   Social History Narrative   Lives with her significant other and 2 grandchildren. 1 child   Has 7 brothers and 4 sisters, 1 twin sister.   Unemployed, worked in [employer].    Abuses alcohol-drinks 1 glass of wine daily    No drug use. Former cigarette use quit 1.5 years ago.     11 th grade education             Review of Systems General: Admits to weight loss. Denies fever, chills, and diaphoresis.  Respiratory: Admits to SOB and DOE. Denies cough, chest tightness, and wheezing.  Cardiovascular: Denies chest pain and palpitations.  Gastrointestinal: Denies nausea, vomiting, abdominal pain, diarrhea, constipation Musculoskeletal: Admits to back pain (chronic). Denies myalgias and gait problem.  Skin: Denies pallor, rash and wounds.  Neurological: Denies dizziness, headaches, weakness, lightheadedness     Objective:   Physical Exam Filed Vitals:   03/02/15 0827  BP: 101/73  Pulse: 85  Temp: 98 F (36.7 C)  TempSrc: Oral  Height: 5\' 1"  (1.549 m)  Weight: 79 lb 6.4 oz  (36.016 kg)  SpO2: 100%   General: Vital signs reviewed.  Patient is thin, chronically ill appearing, in no acute distress and cooperative with exam.  Cardiovascular: RRR, 3/6 holosystolic murmur at left upper sternal border, no gallops, or rubs. Pulmonary/Chest: Clear to auscultation bilaterally, no wheezes, rales, or rhonchi. Abdominal: Soft, non-tender, non-distended, BS +  Musculoskeletal: Muscle wasting.  Extremities: No lower extremity edema bilaterally, pulses symmetric and intact bilaterally.  Neurological: A&O x3 Skin: Warm, dry and intact. No rashes or erythema. Psychiatric: Distant and disinterested mood and affect. Speech and behavior is normal. Cognition and memory are normal.     Assessment & Plan:   Please see problem based assessment and plan.

## 2015-03-02 NOTE — Patient Instructions (Signed)
We have placed orders for an echo for your heart, PFTs (pulmonary function testing) to test your breathing.   We will get lab work on you today.  Please follow up with your Rheumatologist.

## 2015-03-02 NOTE — Assessment & Plan Note (Addendum)
Last visit on 02/12/15 patient was noted to a have new 3/6 holosystolic murmur likely secondary to tricuspid regurgitation and was asymptomatic. Bacterial endocarditis was less likely given lack of IVDU, but she had been positive for both ANA and dsDNA in the past. Further work up of HIV and RF were negative. Previous CT chest showed multifocal infectious/inflammatory process. PFTs never done. At last visit, EKG was unchanged, echo was ordered, but not completed, and it was recommended that at her follow up visit CMET, PFTs should be ordered and rheumatology referral placed. Upon talking with the patient, she does already follow with rheumatology. Her next appointment in on March 08, 2015. She states they sent in a medication for her previously, but it was much too expensive and she couldn't get it. Patient should be considered for cardiology referral at follow up visit. This was not placed today given numerous tests and follow up appointments.  Plan: -CMET -Echo -PFTs -Follow up with Rheumatology -Consider cardiology referral at follow up visit  Addendum: PFTs consistent with an interstitial process such as fibrosis or interstitial inflammation, but anemia cannot be excluded as a potential cause of the diffusion defect. Recommend studies with exercise to evaluate the presence of hypoxemia. Pulmonary Function Diagnosis: Minimal Obstructive Airways Disease and Moderate/severe Restriction. CMET shows elevated LFTs, however, these are chronically elevated. Alk phos appears to be more elevated than usual. Echo is pending. Rheum and Cards follow up are also still pending. Patient has follow up in [facility] clinic on 04/01/15.

## 2015-03-02 NOTE — Assessment & Plan Note (Addendum)
Patient follows with Rheumatology and next visit is on 03/08/15. She states she has not been started on any medications because the last prescription sent in for her was too expensive.

## 2015-03-02 NOTE — Assessment & Plan Note (Addendum)
BP Readings from Last 3 Encounters:  03/02/15 101/73  02/11/15 109/72  01/21/15 99/70    Lab Results  Component Value Date   NA 136 01/21/2015   K 4.9 01/21/2015   CREATININE 2.41* 01/21/2015    Assessment: Blood pressure control:  Controlled Progress toward BP goal:   At goal Comments: Patient was previously becoming orthostatic and lightheaded since starting dialysis. She was continuing to use her blood pressure medications and these were stopped last visit. Patient is not taking any blood pressure medications and states her lightheadedness has improved. She no longer is getting dizzy when she stands up, but does state her BP drops low during HD.   Plan: Medications: Continue off of medications

## 2015-03-03 DIAGNOSIS — E876 Hypokalemia: Secondary | ICD-10-CM | POA: Diagnosis not present

## 2015-03-03 DIAGNOSIS — D688 Other specified coagulation defects: Secondary | ICD-10-CM | POA: Diagnosis not present

## 2015-03-03 DIAGNOSIS — D631 Anemia in chronic kidney disease: Secondary | ICD-10-CM | POA: Diagnosis not present

## 2015-03-03 DIAGNOSIS — T8249XD Other complication of vascular dialysis catheter, subsequent encounter: Secondary | ICD-10-CM | POA: Diagnosis not present

## 2015-03-03 DIAGNOSIS — D509 Iron deficiency anemia, unspecified: Secondary | ICD-10-CM | POA: Diagnosis not present

## 2015-03-03 DIAGNOSIS — Z111 Encounter for screening for respiratory tuberculosis: Secondary | ICD-10-CM | POA: Diagnosis not present

## 2015-03-03 DIAGNOSIS — N186 End stage renal disease: Secondary | ICD-10-CM | POA: Diagnosis not present

## 2015-03-03 LAB — CMP14 + ANION GAP
ALBUMIN: 3.1 g/dL — AB (ref 3.5–5.5)
ALK PHOS: 527 IU/L — AB (ref 39–117)
ALT: 15 IU/L (ref 0–32)
ANION GAP: 23 mmol/L — AB (ref 10.0–18.0)
AST: 56 IU/L — AB (ref 0–40)
Albumin/Globulin Ratio: 0.5 — ABNORMAL LOW (ref 1.1–2.5)
BILIRUBIN TOTAL: 0.4 mg/dL (ref 0.0–1.2)
BUN / CREAT RATIO: 5 — AB (ref 9–23)
BUN: 13 mg/dL (ref 6–24)
CHLORIDE: 88 mmol/L — AB (ref 97–108)
CO2: 23 mmol/L (ref 18–29)
Calcium: 7.9 mg/dL — ABNORMAL LOW (ref 8.7–10.2)
Creatinine, Ser: 2.42 mg/dL — ABNORMAL HIGH (ref 0.57–1.00)
GFR calc Af Amer: 25 mL/min/{1.73_m2} — ABNORMAL LOW (ref >59)
GFR calc non Af Amer: 21 mL/min/{1.73_m2} — ABNORMAL LOW (ref >59)
GLOBULIN, TOTAL: 5.9 g/dL — AB (ref 1.5–4.5)
Glucose: 108 mg/dL — ABNORMAL HIGH (ref 65–99)
Potassium: 3.6 mmol/L (ref 3.5–5.2)
SODIUM: 134 mmol/L (ref 134–144)
Total Protein: 9 g/dL — ABNORMAL HIGH (ref 6.0–8.5)

## 2015-03-05 DIAGNOSIS — Z992 Dependence on renal dialysis: Secondary | ICD-10-CM | POA: Diagnosis not present

## 2015-03-05 DIAGNOSIS — N031 Chronic nephritic syndrome with focal and segmental glomerular lesions: Secondary | ICD-10-CM | POA: Diagnosis not present

## 2015-03-05 DIAGNOSIS — D688 Other specified coagulation defects: Secondary | ICD-10-CM | POA: Diagnosis not present

## 2015-03-05 DIAGNOSIS — Z111 Encounter for screening for respiratory tuberculosis: Secondary | ICD-10-CM | POA: Diagnosis not present

## 2015-03-05 DIAGNOSIS — D631 Anemia in chronic kidney disease: Secondary | ICD-10-CM | POA: Diagnosis not present

## 2015-03-05 DIAGNOSIS — E876 Hypokalemia: Secondary | ICD-10-CM | POA: Diagnosis not present

## 2015-03-05 DIAGNOSIS — D509 Iron deficiency anemia, unspecified: Secondary | ICD-10-CM | POA: Diagnosis not present

## 2015-03-05 DIAGNOSIS — T8249XD Other complication of vascular dialysis catheter, subsequent encounter: Secondary | ICD-10-CM | POA: Diagnosis not present

## 2015-03-05 DIAGNOSIS — N186 End stage renal disease: Secondary | ICD-10-CM | POA: Diagnosis not present

## 2015-03-05 NOTE — Progress Notes (Signed)
Internal Medicine Clinic Attending  Case discussed with Dr. Richardson soon after the resident saw the patient.  We reviewed the resident's history and exam and pertinent patient test results.  I agree with the assessment, diagnosis, and plan of care documented in the resident's note. 

## 2015-03-08 DIAGNOSIS — N186 End stage renal disease: Secondary | ICD-10-CM | POA: Diagnosis not present

## 2015-03-08 DIAGNOSIS — D631 Anemia in chronic kidney disease: Secondary | ICD-10-CM | POA: Diagnosis not present

## 2015-03-08 DIAGNOSIS — E876 Hypokalemia: Secondary | ICD-10-CM | POA: Diagnosis not present

## 2015-03-08 DIAGNOSIS — D688 Other specified coagulation defects: Secondary | ICD-10-CM | POA: Diagnosis not present

## 2015-03-09 ENCOUNTER — Ambulatory Visit (HOSPITAL_COMMUNITY)
Admission: RE | Admit: 2015-03-09 | Discharge: 2015-03-09 | Disposition: A | Discharge status: Home / Self Care | Payer: Medicare Other | Source: Ambulatory Visit | Attending: Internal Medicine | Admitting: Internal Medicine

## 2015-03-09 DIAGNOSIS — I071 Rheumatic tricuspid insufficiency: Secondary | ICD-10-CM | POA: Diagnosis not present

## 2015-03-09 LAB — PULMONARY FUNCTION TEST
DL/VA % PRED: 56 %
DL/VA: 2.56 ml/min/mmHg/L
DLCO unc % pred: 30 %
DLCO unc: 6.5 ml/min/mmHg
FEF 25-75 POST: 1.78 L/s
FEF 25-75 Pre: 1.92 L/sec
FEF2575-%CHANGE-POST: -7 %
FEF2575-%PRED-PRE: 95 %
FEF2575-%Pred-Post: 88 %
FEV1-%Change-Post: 3 %
FEV1-%Pred-Post: 77 %
FEV1-%Pred-Pre: 74 %
FEV1-PRE: 1.47 L
FEV1-Post: 1.52 L
FEV1FVC-%CHANGE-POST: 2 %
FEV1FVC-%Pred-Pre: 111 %
FEV6-%CHANGE-POST: 7 %
FEV6-%Pred-Post: 69 %
FEV6-%Pred-Pre: 64 %
FEV6-PRE: 1.55 L
FEV6-Post: 1.67 L
FEV6FVC-%Pred-Post: 104 %
FEV6FVC-%Pred-Pre: 104 %
FVC-%CHANGE-POST: 1 %
FVC-%Pred-Post: 67 %
FVC-%Pred-Pre: 66 %
FVC-Post: 1.67 L
FVC-Pre: 1.65 L
POST FEV1/FVC RATIO: 91 %
PRE FEV1/FVC RATIO: 89 %
Post FEV6/FVC ratio: 100 %
Pre FEV6/FVC Ratio: 100 %
RV % pred: 73 %
RV: 1.37 L
TLC % pred: 63 %
TLC: 3.04 L

## 2015-03-09 MED ORDER — ALBUTEROL SULFATE (2.5 MG/3ML) 0.083% IN NEBU
2.5000 mg | INHALATION_SOLUTION | Freq: Once | RESPIRATORY_TRACT | Status: AC
  Administered 2015-03-09: 2.5 mg via RESPIRATORY_TRACT

## 2015-03-10 DIAGNOSIS — D631 Anemia in chronic kidney disease: Secondary | ICD-10-CM | POA: Diagnosis not present

## 2015-03-10 DIAGNOSIS — D688 Other specified coagulation defects: Secondary | ICD-10-CM | POA: Diagnosis not present

## 2015-03-10 DIAGNOSIS — N186 End stage renal disease: Secondary | ICD-10-CM | POA: Diagnosis not present

## 2015-03-10 DIAGNOSIS — E876 Hypokalemia: Secondary | ICD-10-CM | POA: Diagnosis not present

## 2015-03-11 ENCOUNTER — Ambulatory Visit (HOSPITAL_COMMUNITY): Admission: RE | Admit: 2015-03-11 | Payer: Medicare Other | Source: Ambulatory Visit

## 2015-03-12 DIAGNOSIS — N186 End stage renal disease: Secondary | ICD-10-CM | POA: Diagnosis not present

## 2015-03-12 DIAGNOSIS — D688 Other specified coagulation defects: Secondary | ICD-10-CM | POA: Diagnosis not present

## 2015-03-12 DIAGNOSIS — E876 Hypokalemia: Secondary | ICD-10-CM | POA: Diagnosis not present

## 2015-03-12 DIAGNOSIS — D631 Anemia in chronic kidney disease: Secondary | ICD-10-CM | POA: Diagnosis not present

## 2015-03-15 DIAGNOSIS — D688 Other specified coagulation defects: Secondary | ICD-10-CM | POA: Diagnosis not present

## 2015-03-15 DIAGNOSIS — N186 End stage renal disease: Secondary | ICD-10-CM | POA: Diagnosis not present

## 2015-03-15 DIAGNOSIS — E876 Hypokalemia: Secondary | ICD-10-CM | POA: Diagnosis not present

## 2015-03-15 DIAGNOSIS — D631 Anemia in chronic kidney disease: Secondary | ICD-10-CM | POA: Diagnosis not present

## 2015-03-17 DIAGNOSIS — D688 Other specified coagulation defects: Secondary | ICD-10-CM | POA: Diagnosis not present

## 2015-03-17 DIAGNOSIS — N186 End stage renal disease: Secondary | ICD-10-CM | POA: Diagnosis not present

## 2015-03-17 DIAGNOSIS — E876 Hypokalemia: Secondary | ICD-10-CM | POA: Diagnosis not present

## 2015-03-17 DIAGNOSIS — D631 Anemia in chronic kidney disease: Secondary | ICD-10-CM | POA: Diagnosis not present

## 2015-03-19 DIAGNOSIS — N186 End stage renal disease: Secondary | ICD-10-CM | POA: Diagnosis not present

## 2015-03-19 DIAGNOSIS — D631 Anemia in chronic kidney disease: Secondary | ICD-10-CM | POA: Diagnosis not present

## 2015-03-19 DIAGNOSIS — D688 Other specified coagulation defects: Secondary | ICD-10-CM | POA: Diagnosis not present

## 2015-03-19 DIAGNOSIS — E876 Hypokalemia: Secondary | ICD-10-CM | POA: Diagnosis not present

## 2015-03-22 DIAGNOSIS — E876 Hypokalemia: Secondary | ICD-10-CM | POA: Diagnosis not present

## 2015-03-22 DIAGNOSIS — N186 End stage renal disease: Secondary | ICD-10-CM | POA: Diagnosis not present

## 2015-03-22 DIAGNOSIS — D631 Anemia in chronic kidney disease: Secondary | ICD-10-CM | POA: Diagnosis not present

## 2015-03-22 DIAGNOSIS — D688 Other specified coagulation defects: Secondary | ICD-10-CM | POA: Diagnosis not present

## 2015-03-23 ENCOUNTER — Other Ambulatory Visit: Payer: Self-pay

## 2015-03-23 DIAGNOSIS — M329 Systemic lupus erythematosus, unspecified: Secondary | ICD-10-CM | POA: Diagnosis not present

## 2015-03-23 DIAGNOSIS — N186 End stage renal disease: Secondary | ICD-10-CM | POA: Diagnosis not present

## 2015-03-23 DIAGNOSIS — R630 Anorexia: Secondary | ICD-10-CM | POA: Diagnosis not present

## 2015-03-23 DIAGNOSIS — N2581 Secondary hyperparathyroidism of renal origin: Secondary | ICD-10-CM | POA: Diagnosis not present

## 2015-03-23 DIAGNOSIS — N039 Chronic nephritic syndrome with unspecified morphologic changes: Secondary | ICD-10-CM | POA: Diagnosis not present

## 2015-03-24 DIAGNOSIS — D631 Anemia in chronic kidney disease: Secondary | ICD-10-CM | POA: Diagnosis not present

## 2015-03-24 DIAGNOSIS — D688 Other specified coagulation defects: Secondary | ICD-10-CM | POA: Diagnosis not present

## 2015-03-24 DIAGNOSIS — N186 End stage renal disease: Secondary | ICD-10-CM | POA: Diagnosis not present

## 2015-03-24 DIAGNOSIS — E876 Hypokalemia: Secondary | ICD-10-CM | POA: Diagnosis not present

## 2015-03-24 MED ORDER — ADULT MULTIVITAMIN W/MINERALS CH: 1.0000 | ORAL_TABLET | Freq: Every day | ORAL | Status: DC

## 2015-03-25 DIAGNOSIS — J9620 Acute and chronic respiratory failure, unspecified whether with hypoxia or hypercapnia: Secondary | ICD-10-CM | POA: Diagnosis not present

## 2015-03-26 DIAGNOSIS — D631 Anemia in chronic kidney disease: Secondary | ICD-10-CM | POA: Diagnosis not present

## 2015-03-26 DIAGNOSIS — E876 Hypokalemia: Secondary | ICD-10-CM | POA: Diagnosis not present

## 2015-03-26 DIAGNOSIS — D688 Other specified coagulation defects: Secondary | ICD-10-CM | POA: Diagnosis not present

## 2015-03-26 DIAGNOSIS — N186 End stage renal disease: Secondary | ICD-10-CM | POA: Diagnosis not present

## 2015-03-29 DIAGNOSIS — D688 Other specified coagulation defects: Secondary | ICD-10-CM | POA: Diagnosis not present

## 2015-03-29 DIAGNOSIS — N186 End stage renal disease: Secondary | ICD-10-CM | POA: Diagnosis not present

## 2015-03-29 DIAGNOSIS — D631 Anemia in chronic kidney disease: Secondary | ICD-10-CM | POA: Diagnosis not present

## 2015-03-29 DIAGNOSIS — E876 Hypokalemia: Secondary | ICD-10-CM | POA: Diagnosis not present

## 2015-03-31 DIAGNOSIS — N186 End stage renal disease: Secondary | ICD-10-CM | POA: Diagnosis not present

## 2015-03-31 DIAGNOSIS — E876 Hypokalemia: Secondary | ICD-10-CM | POA: Diagnosis not present

## 2015-03-31 DIAGNOSIS — D631 Anemia in chronic kidney disease: Secondary | ICD-10-CM | POA: Diagnosis not present

## 2015-03-31 DIAGNOSIS — D688 Other specified coagulation defects: Secondary | ICD-10-CM | POA: Diagnosis not present

## 2015-04-01 ENCOUNTER — Ambulatory Visit: Payer: Self-pay | Admitting: Internal Medicine

## 2015-04-02 DIAGNOSIS — D688 Other specified coagulation defects: Secondary | ICD-10-CM | POA: Diagnosis not present

## 2015-04-02 DIAGNOSIS — D631 Anemia in chronic kidney disease: Secondary | ICD-10-CM | POA: Diagnosis not present

## 2015-04-02 DIAGNOSIS — N186 End stage renal disease: Secondary | ICD-10-CM | POA: Diagnosis not present

## 2015-04-02 DIAGNOSIS — E876 Hypokalemia: Secondary | ICD-10-CM | POA: Diagnosis not present

## 2015-04-05 DIAGNOSIS — E876 Hypokalemia: Secondary | ICD-10-CM | POA: Diagnosis not present

## 2015-04-05 DIAGNOSIS — D631 Anemia in chronic kidney disease: Secondary | ICD-10-CM | POA: Diagnosis not present

## 2015-04-05 DIAGNOSIS — D688 Other specified coagulation defects: Secondary | ICD-10-CM | POA: Diagnosis not present

## 2015-04-05 DIAGNOSIS — N186 End stage renal disease: Secondary | ICD-10-CM | POA: Diagnosis not present

## 2015-04-05 DIAGNOSIS — N031 Chronic nephritic syndrome with focal and segmental glomerular lesions: Secondary | ICD-10-CM | POA: Diagnosis not present

## 2015-04-05 DIAGNOSIS — Z992 Dependence on renal dialysis: Secondary | ICD-10-CM | POA: Diagnosis not present

## 2015-04-07 ENCOUNTER — Other Ambulatory Visit: Payer: Self-pay | Admitting: *Deleted

## 2015-04-07 DIAGNOSIS — N186 End stage renal disease: Secondary | ICD-10-CM | POA: Diagnosis not present

## 2015-04-07 DIAGNOSIS — D688 Other specified coagulation defects: Secondary | ICD-10-CM | POA: Diagnosis not present

## 2015-04-07 DIAGNOSIS — E876 Hypokalemia: Secondary | ICD-10-CM | POA: Diagnosis not present

## 2015-04-08 ENCOUNTER — Ambulatory Visit: Payer: Self-pay | Admitting: Internal Medicine

## 2015-04-09 DIAGNOSIS — D688 Other specified coagulation defects: Secondary | ICD-10-CM | POA: Diagnosis not present

## 2015-04-09 DIAGNOSIS — N186 End stage renal disease: Secondary | ICD-10-CM | POA: Diagnosis not present

## 2015-04-09 DIAGNOSIS — E876 Hypokalemia: Secondary | ICD-10-CM | POA: Diagnosis not present

## 2015-04-09 MED ORDER — GABAPENTIN 300 MG PO CAPS: 300.0000 mg | ORAL_CAPSULE | Freq: Every day | ORAL | Status: DC

## 2015-04-12 DIAGNOSIS — N186 End stage renal disease: Secondary | ICD-10-CM | POA: Diagnosis not present

## 2015-04-12 DIAGNOSIS — E876 Hypokalemia: Secondary | ICD-10-CM | POA: Diagnosis not present

## 2015-04-12 DIAGNOSIS — D688 Other specified coagulation defects: Secondary | ICD-10-CM | POA: Diagnosis not present

## 2015-04-14 DIAGNOSIS — E876 Hypokalemia: Secondary | ICD-10-CM | POA: Diagnosis not present

## 2015-04-14 DIAGNOSIS — N186 End stage renal disease: Secondary | ICD-10-CM | POA: Diagnosis not present

## 2015-04-14 DIAGNOSIS — D688 Other specified coagulation defects: Secondary | ICD-10-CM | POA: Diagnosis not present

## 2015-04-16 DIAGNOSIS — E876 Hypokalemia: Secondary | ICD-10-CM | POA: Diagnosis not present

## 2015-04-16 DIAGNOSIS — D688 Other specified coagulation defects: Secondary | ICD-10-CM | POA: Diagnosis not present

## 2015-04-16 DIAGNOSIS — N186 End stage renal disease: Secondary | ICD-10-CM | POA: Diagnosis not present

## 2015-04-19 DIAGNOSIS — E876 Hypokalemia: Secondary | ICD-10-CM | POA: Diagnosis not present

## 2015-04-19 DIAGNOSIS — N186 End stage renal disease: Secondary | ICD-10-CM | POA: Diagnosis not present

## 2015-04-19 DIAGNOSIS — D688 Other specified coagulation defects: Secondary | ICD-10-CM | POA: Diagnosis not present

## 2015-04-21 ENCOUNTER — Emergency Department (HOSPITAL_COMMUNITY): Payer: Medicare Other

## 2015-04-21 ENCOUNTER — Emergency Department (HOSPITAL_COMMUNITY)
Admission: EM | Admit: 2015-04-21 | Discharge: 2015-04-21 | Disposition: A | Discharge status: Home / Self Care | Payer: Medicare Other | Attending: Emergency Medicine | Admitting: Emergency Medicine

## 2015-04-21 ENCOUNTER — Encounter (HOSPITAL_COMMUNITY): Payer: Self-pay | Admitting: General Practice

## 2015-04-21 DIAGNOSIS — K219 Gastro-esophageal reflux disease without esophagitis: Secondary | ICD-10-CM | POA: Diagnosis not present

## 2015-04-21 DIAGNOSIS — R531 Weakness: Secondary | ICD-10-CM | POA: Diagnosis not present

## 2015-04-21 DIAGNOSIS — F10120 Alcohol abuse with intoxication, uncomplicated: Secondary | ICD-10-CM | POA: Diagnosis not present

## 2015-04-21 DIAGNOSIS — Z8709 Personal history of other diseases of the respiratory system: Secondary | ICD-10-CM | POA: Insufficient documentation

## 2015-04-21 DIAGNOSIS — F329 Major depressive disorder, single episode, unspecified: Secondary | ICD-10-CM | POA: Insufficient documentation

## 2015-04-21 DIAGNOSIS — Z992 Dependence on renal dialysis: Secondary | ICD-10-CM | POA: Diagnosis not present

## 2015-04-21 DIAGNOSIS — Z79899 Other long term (current) drug therapy: Secondary | ICD-10-CM | POA: Insufficient documentation

## 2015-04-21 DIAGNOSIS — I5032 Chronic diastolic (congestive) heart failure: Secondary | ICD-10-CM | POA: Diagnosis not present

## 2015-04-21 DIAGNOSIS — Z9981 Dependence on supplemental oxygen: Secondary | ICD-10-CM | POA: Insufficient documentation

## 2015-04-21 DIAGNOSIS — G40909 Epilepsy, unspecified, not intractable, without status epilepticus: Secondary | ICD-10-CM | POA: Insufficient documentation

## 2015-04-21 DIAGNOSIS — Z8701 Personal history of pneumonia (recurrent): Secondary | ICD-10-CM | POA: Insufficient documentation

## 2015-04-21 DIAGNOSIS — G4700 Insomnia, unspecified: Secondary | ICD-10-CM | POA: Diagnosis not present

## 2015-04-21 DIAGNOSIS — Z8611 Personal history of tuberculosis: Secondary | ICD-10-CM | POA: Insufficient documentation

## 2015-04-21 DIAGNOSIS — R404 Transient alteration of awareness: Secondary | ICD-10-CM | POA: Diagnosis not present

## 2015-04-21 DIAGNOSIS — N186 End stage renal disease: Secondary | ICD-10-CM | POA: Diagnosis not present

## 2015-04-21 DIAGNOSIS — Z87891 Personal history of nicotine dependence: Secondary | ICD-10-CM | POA: Diagnosis not present

## 2015-04-21 DIAGNOSIS — Z8781 Personal history of (healed) traumatic fracture: Secondary | ICD-10-CM | POA: Insufficient documentation

## 2015-04-21 DIAGNOSIS — Z8639 Personal history of other endocrine, nutritional and metabolic disease: Secondary | ICD-10-CM | POA: Insufficient documentation

## 2015-04-21 DIAGNOSIS — Y908 Blood alcohol level of 240 mg/100 ml or more: Secondary | ICD-10-CM | POA: Diagnosis not present

## 2015-04-21 DIAGNOSIS — G9341 Metabolic encephalopathy: Secondary | ICD-10-CM

## 2015-04-21 DIAGNOSIS — I120 Hypertensive chronic kidney disease with stage 5 chronic kidney disease or end stage renal disease: Secondary | ICD-10-CM | POA: Insufficient documentation

## 2015-04-21 DIAGNOSIS — D510 Vitamin B12 deficiency anemia due to intrinsic factor deficiency: Secondary | ICD-10-CM | POA: Diagnosis not present

## 2015-04-21 DIAGNOSIS — I639 Cerebral infarction, unspecified: Secondary | ICD-10-CM | POA: Diagnosis not present

## 2015-04-21 DIAGNOSIS — F10920 Alcohol use, unspecified with intoxication, uncomplicated: Secondary | ICD-10-CM

## 2015-04-21 DIAGNOSIS — F10129 Alcohol abuse with intoxication, unspecified: Secondary | ICD-10-CM | POA: Diagnosis not present

## 2015-04-21 LAB — DIFFERENTIAL
BASOS ABS: 0 10*3/uL (ref 0.0–0.1)
BASOS PCT: 1 %
BLASTS: 0 %
Band Neutrophils: 0 %
EOS PCT: 4 %
Eosinophils Absolute: 0.2 10*3/uL (ref 0.0–0.7)
LYMPHS ABS: 2 10*3/uL (ref 0.7–4.0)
Lymphocytes Relative: 44 %
METAMYELOCYTES PCT: 0 %
MONO ABS: 1.1 10*3/uL — AB (ref 0.1–1.0)
MYELOCYTES: 0 %
Monocytes Relative: 25 %
NEUTROS ABS: 1.1 10*3/uL — AB (ref 1.7–7.7)
NRBC: 0 /100{WBCs}
Neutrophils Relative %: 26 %
Other: 0 %
PROMYELOCYTES ABS: 0 %

## 2015-04-21 LAB — COMPREHENSIVE METABOLIC PANEL
ALT: 30 U/L (ref 14–54)
ANION GAP: 9 (ref 5–15)
AST: 90 U/L — ABNORMAL HIGH (ref 15–41)
Albumin: 2.6 g/dL — ABNORMAL LOW (ref 3.5–5.0)
Alkaline Phosphatase: 549 U/L — ABNORMAL HIGH (ref 38–126)
BILIRUBIN TOTAL: 0.3 mg/dL (ref 0.3–1.2)
BUN: 20 mg/dL (ref 6–20)
CHLORIDE: 101 mmol/L (ref 101–111)
CO2: 22 mmol/L (ref 22–32)
Calcium: 8.2 mg/dL — ABNORMAL LOW (ref 8.9–10.3)
Creatinine, Ser: 3.7 mg/dL — ABNORMAL HIGH (ref 0.44–1.00)
GFR calc Af Amer: 15 mL/min — ABNORMAL LOW (ref >60)
GFR, EST NON AFRICAN AMERICAN: 13 mL/min — AB (ref >60)
Glucose, Bld: 104 mg/dL — ABNORMAL HIGH (ref 65–99)
POTASSIUM: 3.8 mmol/L (ref 3.5–5.1)
Sodium: 132 mmol/L — ABNORMAL LOW (ref 135–145)
TOTAL PROTEIN: 8.3 g/dL — AB (ref 6.5–8.1)

## 2015-04-21 LAB — CBC
HEMATOCRIT: 36.9 % (ref 36.0–46.0)
HEMOGLOBIN: 11.7 g/dL — AB (ref 12.0–15.0)
MCH: 31 pg (ref 26.0–34.0)
MCHC: 31.7 g/dL (ref 30.0–36.0)
MCV: 97.9 fL (ref 78.0–100.0)
Platelets: 111 10*3/uL — ABNORMAL LOW (ref 150–400)
RBC: 3.77 MIL/uL — AB (ref 3.87–5.11)
RDW: 15.2 % (ref 11.5–15.5)
WBC: 4.4 10*3/uL (ref 4.0–10.5)

## 2015-04-21 LAB — I-STAT CHEM 8, ED
BUN: 25 mg/dL — ABNORMAL HIGH (ref 6–20)
CALCIUM ION: 1.04 mmol/L — AB (ref 1.12–1.23)
Chloride: 99 mmol/L — ABNORMAL LOW (ref 101–111)
Creatinine, Ser: 3.9 mg/dL — ABNORMAL HIGH (ref 0.44–1.00)
Glucose, Bld: 107 mg/dL — ABNORMAL HIGH (ref 65–99)
HCT: 41 % (ref 36.0–46.0)
HEMOGLOBIN: 13.9 g/dL (ref 12.0–15.0)
Potassium: 3.9 mmol/L (ref 3.5–5.1)
SODIUM: 137 mmol/L (ref 135–145)
TCO2: 22 mmol/L (ref 0–100)

## 2015-04-21 LAB — I-STAT TROPONIN, ED: TROPONIN I, POC: 0.01 ng/mL (ref 0.00–0.08)

## 2015-04-21 LAB — ETHANOL: Alcohol, Ethyl (B): 390 mg/dL (ref <5)

## 2015-04-21 LAB — CBG MONITORING, ED: GLUCOSE-CAPILLARY: 95 mg/dL (ref 65–99)

## 2015-04-21 LAB — AMMONIA: AMMONIA: 92 umol/L — AB (ref 9–35)

## 2015-04-21 LAB — PROTIME-INR
INR: 1.04 (ref 0.00–1.49)
Prothrombin Time: 13.8 seconds (ref 11.6–15.2)

## 2015-04-21 LAB — APTT: APTT: 32 s (ref 24–37)

## 2015-04-21 NOTE — ED Provider Notes (Signed)
59 yo F with a chief complaint of alcohol intoxication. Patient reassessed able to ambulate to the bathroom without difficulty. No noted continued neuro deficits. Will discharge home. Family doctor follow-up.  Deno Etienne, DO 04/21/15 1745

## 2015-04-21 NOTE — Code Documentation (Signed)
59yo arriving to [facility] via [facility] at 1142.  Patient took the bus to dialysis today and when she arrived to dialysis staff noted slurred speech and bilateral leg weakness.  EMS called and activated a code stroke.  Stroke team at the bedside on patient arrival.  Labs drawn and patient cleared by Dr. Tomi Bamberger.  Patient to CT.  NIHSS 0, see documentation for details and code stroke times.  Dr. Silverio Decamp to the bedside.  No acute stroke treatment at this time.  Code stroke canceled.  Bedside handoff with ED RN Dreama.

## 2015-04-21 NOTE — ED Notes (Signed)
Upon entering room to check on pt, family members yelling at this nurse questioning why the pt is not getting admitted. Explained to family that medically the pt is cleared to go home and we are waiting on pt to be able to walk steadily and pt will be DC.

## 2015-04-21 NOTE — ED Notes (Signed)
Pt presents to the ED from dialysis via [facility]. Pt was LSN at 0900 on the scat bus. Staff at dialysis noticed pt had slurred speech, and bilateral leg weakness. Pt is neurologically intact with some intermittent confusion. Daughter reporting that pt has started back drinking alcohol, and this behavior is similar to when she starts drinking.

## 2015-04-21 NOTE — Discharge Instructions (Signed)
Alcohol Intoxication  Alcohol intoxication occurs when you drink enough alcohol that it affects your ability to function. It can be mild or very severe. Drinking a lot of alcohol in a short time is called binge drinking. This can be very harmful. Drinking alcohol can also be more dangerous if you are taking medicines or other drugs. Some of the effects caused by alcohol may include:  · Loss of coordination.  · Changes in mood and behavior.  · Unclear thinking.  · Trouble talking (slurred speech).  · Throwing up (vomiting).  · Confusion.  · Slowed breathing.  · Twitching and shaking (seizures).  · Loss of consciousness.  HOME CARE  · Do not drive after drinking alcohol.  · Drink enough water and fluids to keep your pee (urine) clear or pale yellow. Avoid caffeine.  · Only take medicine as told by your doctor.  GET HELP IF:  · You throw up (vomit) many times.  · You do not feel better after a few days.  · You frequently have alcohol intoxication. Your doctor can help decide if you should see a substance use treatment counselor.  GET HELP RIGHT AWAY IF:  · You become shaky when you stop drinking.  · You have twitching and shaking.  · You throw up blood. It may look bright red or like coffee grounds.  · You notice blood in your poop (bowel movements).  · You become lightheaded or pass out (faint).  MAKE SURE YOU:   · Understand these instructions.  · Will watch your condition.  · Will get help right away if you are not doing well or get worse.     This information is not intended to replace advice given to you by your health care provider. Make sure you discuss any questions you have with your health care provider.     Document Released: 11/08/2007 Document Revised: 01/22/2013 Document Reviewed: 10/25/2012  Elsevier Interactive Patient Education ©2016 [employer]

## 2015-04-21 NOTE — ED Provider Notes (Signed)
CSN: [id number]     Arrival date & time 04/21/15  1143 History   First MD Initiated Contact with Patient 04/21/15 1152     Chief Complaint  Patient presents with  . Code Stroke     (Consider location/radiation/quality/duration/timing/severity/associated sxs/prior Treatment) HPI Comments: Patient is a 59 year old female with a long history of chronic alcohol abuse, end-stage renal disease on dialysis, poor medical compliance who presents today from dialysis for possible code stroke. When she arrived at dialysis today they noted that her voice was slurred and generalized weakness. She did not receive dialysis but was transported directly to the hospital. They have also noted she's been intermittently confused. Daughter is present at bedside and states that she has started drinking again and not taking her medication. She states she does this all the time. No prior history of stroke or diabetes. Patient states last time she drank was 4 days ago.  The history is provided by the patient, the EMS personnel and a relative.    Past Medical History  Diagnosis Date  . Anemia, B12 deficiency   . History of acute pancreatitis   . Right knee pain     No recent imaging on chart  . 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 Dr. Mancel Bale, [facility]  . Hypertriglyceridemia   . GERD (gastroesophageal reflux disease)   . Subdural hematoma ([place]) 02/2008    Likely 2/2 trauma from seizure from EtOH withdrawal, chronic in nature, sees Dr. Jerene Bears. Most recent CT head 10/2009 showing stable but persistent hematoma without mass effect.  . History of seizure disorder     Likely 2/2 alcohol abuse  . Hypocalcemia   . Hypomagnesemia   . Failure to thrive in childhood     Unclear etiology  . HTN (hypertension)   . Thrombocytopenia ([place])   . Hepatomegaly     On exam  . Joint pain   . Alcohol abuse   . Vitamin D deficiency   . Pancreatitis   .  Insomnia   . Hyperlipidemia   . Pernicious anemia   . Macrocytic anemia   . 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 Depression   . Fx humeral neck 04/17/2011    Transverse fracture- minimally displaced- managed as outpatient   . ABNORMAL PAP SMEAR, LGSIL 07/23/2008    Annotation: HPV positive CIN I Dr. Mancel Bale, [facility] Qualifier: Diagnosis of  By: Oretha Ellis    . Pneumonia 05/20/2012  . Arthritis     "shoulders" (08/15/2013)  . 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  Marland Kitchen Chronic diastolic CHF (congestive heart failure) ([place])   . Hypomagnesemia   . Seizures ([place])     "don't know when/why I had them; daughter was always there w/me"  . On home oxygen therapy     "3L; mostly at night" (06/19/2014)  . Shortness of breath dyspnea   . Pleural effusion    Past Surgical History  Procedure Laterality Date  . Cesarean section  1983  . Esophagogastroduodenoscopy  07/11/2011    Procedure: ESOPHAGOGASTRODUODENOSCOPY (EGD);  Surgeon: Beryle Beams, MD;  Location: [facility];  Service: Endoscopy;  Laterality: N/A;  . Colonoscopy  07/11/2011    Procedure: COLONOSCOPY;  Surgeon: Beryle Beams, MD;  Location: [facility];  Service: Endoscopy;  Laterality: N/A;  . Eye surgery Left     "  trauma"  . Right colectomy  08/28/2011  . Esophagogastroduodenoscopy N/A 12/01/2012    Procedure: ESOPHAGOGASTRODUODENOSCOPY (EGD);  Surgeon: Irene Shipper, MD;  Location: [facility];  Service: Endoscopy;  Laterality: N/A;  . Colonoscopy with esophagogastroduodenoscopy (egd) Left 08/21/2013    Procedure: COLONOSCOPY WITH ESOPHAGOGASTRODUODENOSCOPY (EGD);  Surgeon: Beryle Beams, MD;  Location: [facility] ENDOSCOPY;  Service: Endoscopy;  Laterality: Left;  . Subxyphoid pericardial window N/A 12/04/2013    Procedure: SUBXYPHOID PERICARDIAL WINDOW WITH TEE;  Surgeon: Ivin Poot, MD;  Location: [place];  Service: Thoracic;  Laterality: N/A;  . Intraoperative  transesophageal echocardiogram N/A 12/04/2013    Procedure: INTRAOPERATIVE TRANSESOPHAGEAL ECHOCARDIOGRAM;  Surgeon: Ivin Poot, MD;  Location: [place];  Service: Open Heart Surgery;  Laterality: N/A;  . Exchange of a dialysis catheter Left 10/22/2014    Procedure: EXCHANGE OF A DIALYSIS CATHETER;  Surgeon: Conrad Ottertail, MD;  Location: [place];  Service: Vascular;  Laterality: Left;  . Av fistula placement Left 12/03/2014    Procedure: INSERTION OF ARTERIOVENOUS (AV) GORE-TEX GRAFT ARM;  Surgeon: Mal Misty, MD;  Location: [facility];  Service: Vascular;  Laterality: Left;   Family History  Problem Relation Age of Onset  . Cancer Mother     Died from stomach cancer and "flesh eating rash  . Heart failure Father     Died in 33s from an MI  . Alcohol abuse Sister     Twin sister drinks a lot, as did both her parents and brothers  . Stroke Brother     Has 7 brothers, 1 with CVA  . Lupus Mother    Social History  Substance Use Topics  . Smoking status: Former Smoker -- 0.50 packs/day for 40 years    Types: Cigarettes    Quit date: 09/20/2010  . Smokeless tobacco: Never Used  . Alcohol Use: 0.0 oz/week    0 Standard drinks or equivalent per week     Comment: 12/02/14 "graduated from [employer] (for alcohol abuse) in October 2015"    OB History    No data available     Review of Systems  All other systems reviewed and are negative.     Allergies  Amitriptyline hcl and Doxycycline hyclate  Home Medications   Prior to Admission medications   Medication Sig Start Date End Date Taking? Authorizing Provider  albuterol (PROAIR HFA) 108 (90 BASE) MCG/ACT inhaler Inhale 1-2 puffs into the lungs every 6 (six) hours as needed for wheezing or shortness of breath. 01/14/14   Jones Bales, MD  cetirizine (ZYRTEC) 10 MG tablet Take 1 tablet (10 mg total) by mouth daily as needed for allergies. 09/20/14   Otho Bellows, MD  CREON 12000 UNITS CPEP capsule Take 1 capsule (12,000  Units total) by mouth 3 (three) times daily before meals. 01/21/15   Shela Leff, MD  ENSURE (ENSURE) Take 237 mLs by mouth 2 (two) times daily between meals.    Historical Provider, MD  FLUoxetine (PROZAC) 10 MG capsule Take 1 capsule (10 mg total) by mouth daily. For depression 01/21/15   Shela Leff, MD  folic acid (FOLVITE) 1 MG tablet Take 1 tablet (1 mg total) by mouth daily. For folic acid replacement 09/20/14   Otho Bellows, MD  gabapentin (NEURONTIN) 300 MG capsule Take 1 capsule (300 mg total) by mouth daily. 04/09/15 04/06/16  Shela Leff, MD  Lactobacillus (ACIDOPHILUS PROBIOTIC PO) Take 2 mg by mouth 3 (three) times daily.  Take 2 (two) tablets by mouth three times daily    Historical Provider, MD  mirtazapine (REMERON) 7.5 MG tablet Take 1 tablet (7.5 mg total) by mouth at bedtime. 01/21/15   Shela Leff, MD  Multiple Vitamin (MULTIVITAMIN WITH MINERALS) TABS tablet Take 1 tablet by mouth daily. For vitamin replacement 03/24/15   Shela Leff, MD  Nutritional Supplements (FEEDING SUPPLEMENT, NEPRO CARB STEADY,) LIQD Take 237 mLs by mouth 3 (three) times daily with meals. 10/29/14   Charlesetta Shanks, MD  omeprazole (PRILOSEC) 40 MG capsule Take 1 capsule (40 mg total) by mouth daily. 01/21/15   Shela Leff, MD  ramelteon (ROZEREM) 8 MG tablet Take 1 tablet (8 mg total) by mouth at bedtime. 01/21/15   Shela Leff, MD  thiamine (VITAMIN B-1) 100 MG tablet Take 1 tablet (100 mg total) by mouth daily. For low thiamine 01/21/15   Shela Leff, MD  vitamin B-12 (CYANOCOBALAMIN) 250 MCG tablet Take 1 tablet (250 mcg total) by mouth every evening. 01/21/15   Shela Leff, MD   BP 122/96 mmHg  Pulse 92  Temp(Src) 98.1 F (36.7 C) (Oral)  Resp 15  Ht 5\' 1"  (1.549 m)  Wt 87 lb 11.9 oz (39.8 kg)  BMI 16.59 kg/m2  SpO2 97% Physical Exam  Constitutional: She is oriented to person, place, and time. She appears well-developed. She appears cachectic. No  distress.  HENT:  Head: Normocephalic and atraumatic.  Mouth/Throat: Oropharynx is clear and moist.  Eyes: Conjunctivae and EOM are normal. Pupils are equal, round, and reactive to light.  Neck: Normal range of motion. Neck supple.  Cardiovascular: Normal rate, regular rhythm and intact distal pulses.   No murmur heard. Pulmonary/Chest: Effort normal and breath sounds normal. No respiratory distress. She has no wheezes. She has no rales.  Abdominal: Soft. She exhibits no distension. There is no tenderness. There is no rebound and no guarding.  Musculoskeletal: Normal range of motion. She exhibits no edema or tenderness.  Neurological: She is alert and oriented to person, place, and time.  5/5 strength in bilateral upper and lower extremities. [place]. Confusion when given complex task to complete. Unable to spell backwards, subtract simple math, unable to put her left thumb in her right ear. Slight slurred speech noted  Skin: Skin is warm and dry. No rash noted. No erythema.  Psychiatric: She has a normal mood and affect. Her behavior is normal.  Nursing note and vitals reviewed.   ED Course  Procedures (including critical care time) Labs Review Labs Reviewed  CBC - Abnormal; Notable for the following:    RBC 3.77 (*)    Hemoglobin 11.7 (*)    Platelets 111 (*)    All other components within normal limits  DIFFERENTIAL - Abnormal; Notable for the following:    Neutro Abs 1.1 (*)    Monocytes Absolute 1.1 (*)    All other components within normal limits  COMPREHENSIVE METABOLIC PANEL - Abnormal; Notable for the following:    Sodium 132 (*)    Glucose, Bld 104 (*)    Creatinine, Ser 3.70 (*)    Calcium 8.2 (*)    Total Protein 8.3 (*)    Albumin 2.6 (*)    AST 90 (*)    Alkaline Phosphatase 549 (*)    GFR calc non Af Amer 13 (*)    GFR calc Af Amer 15 (*)    All other components within normal limits  AMMONIA - Abnormal; Notable for the following:  Ammonia 92 (*)    All  other components within normal limits  ETHANOL - Abnormal; Notable for the following:    Alcohol, Ethyl (B) 390 (*)    All other components within normal limits  I-STAT CHEM 8, ED - Abnormal; Notable for the following:    Chloride 99 (*)    BUN 25 (*)    Creatinine, Ser 3.90 (*)    Glucose, Bld 107 (*)    Calcium, Ion 1.04 (*)    All other components within normal limits  PROTIME-INR  APTT  URINE RAPID DRUG SCREEN, HOSP PERFORMED  I-STAT TROPOININ, ED  CBG MONITORING, ED    Imaging Review Ct Head Wo Contrast  04/21/2015  CLINICAL DATA:  Code stroke, slurred speech, bilateral leg weakness. Hemodialysis patient. EXAM: CT HEAD WITHOUT CONTRAST TECHNIQUE: Contiguous axial images were obtained from the base of the skull through the vertex without intravenous contrast. COMPARISON:  Head CT 01/15/2013 FINDINGS: No acute intracranial hemorrhage. No focal mass lesion. No CT evidence of acute infarction. No midline shift or mass effect. No hydrocephalus. Basilar cisterns are patent. There is generalized cortical atrophy and mild proportional ventricular dilatation. Paranasal sinuses and  mastoid air cells are clear. IMPRESSION: No CT evidence of acute infarction or intracranial hemorrhage. Findings conveyed toDr. Eugenie Norrie 04/21/2015  at12:07. Electronically Signed   By: Suzy Bouchard M.D.   On: 04/21/2015 12:09   I have personally reviewed and evaluated these images and lab results as part of my medical decision-making.   EKG Interpretation   Date/Time:  Wednesday April 21 2015 12:00:52 EST Ventricular Rate:  95 PR Interval:  200 QRS Duration: 79 QT Interval:  386 QTC Calculation: 485 R Axis:   54 Text Interpretation:  Sinus rhythm Borderline prolonged PR interval  Anteroseptal infarct, age indeterminate No significant change since last  tracing Confirmed by Maryan Rued  MD, Loree Fee ([id number]) on 04/21/2015 12:17:29  PM      MDM   Final diagnoses:  None    Patient is a  59 year old female presenting initially as a code stroke which was canceled. A she has a history of alcohol abuse, poor medical compliance and end-stage renal disease on dialysis.  Today she was noted to have slurred speech and generalized weakness. On exam here patient has some intermittent confusion without focal neurologic deficit. Concerned more for encephalopathy either due to renal disease, alcohol use or hepatic encephalopathy. Head CT is negative for acute stroke.    Patient denies any abdominal pain, vomiting, chest pain or shortness of breath. EKG without acute findings. Chem 8 showing creatinine of 3.9 and a normal potassium at this time. Hemoglobin at baseline.  Troponin, PT and INR are all within normal limits. Blood sugar is within normal limits.   EtOH, ammonia are pending.   3:07 PM Ammonia is elevated at 90 base level is 390 which is most likely the cause of her slurred speech today. Discussed these findings with neurology and they agreed that she does not meet any stroke criteria and when she is sober at this slurred speech has resolved and she is able to ambulate without difficulty she can go home. Patient checked out to Dr. Tyrone Nine at 1600.   Blanchie Dessert, MD 04/21/15 [phone]
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
## 2015-04-24 DIAGNOSIS — N186 End stage renal disease: Secondary | ICD-10-CM | POA: Diagnosis not present

## 2015-04-24 DIAGNOSIS — E876 Hypokalemia: Secondary | ICD-10-CM | POA: Diagnosis not present

## 2015-04-24 DIAGNOSIS — D688 Other specified coagulation defects: Secondary | ICD-10-CM | POA: Diagnosis not present

## 2015-04-25 DIAGNOSIS — J9620 Acute and chronic respiratory failure, unspecified whether with hypoxia or hypercapnia: Secondary | ICD-10-CM | POA: Diagnosis not present

## 2015-04-26 DIAGNOSIS — D688 Other specified coagulation defects: Secondary | ICD-10-CM | POA: Diagnosis not present

## 2015-04-26 DIAGNOSIS — E876 Hypokalemia: Secondary | ICD-10-CM | POA: Diagnosis not present

## 2015-04-26 DIAGNOSIS — N186 End stage renal disease: Secondary | ICD-10-CM | POA: Diagnosis not present

## 2015-04-28 DIAGNOSIS — N186 End stage renal disease: Secondary | ICD-10-CM | POA: Diagnosis not present

## 2015-04-28 DIAGNOSIS — E876 Hypokalemia: Secondary | ICD-10-CM | POA: Diagnosis not present

## 2015-04-28 DIAGNOSIS — D688 Other specified coagulation defects: Secondary | ICD-10-CM | POA: Diagnosis not present

## 2015-05-01 DIAGNOSIS — N186 End stage renal disease: Secondary | ICD-10-CM | POA: Diagnosis not present

## 2015-05-01 DIAGNOSIS — E876 Hypokalemia: Secondary | ICD-10-CM | POA: Diagnosis not present

## 2015-05-01 DIAGNOSIS — D688 Other specified coagulation defects: Secondary | ICD-10-CM | POA: Diagnosis not present

## 2015-05-03 DIAGNOSIS — E876 Hypokalemia: Secondary | ICD-10-CM | POA: Diagnosis not present

## 2015-05-03 DIAGNOSIS — D688 Other specified coagulation defects: Secondary | ICD-10-CM | POA: Diagnosis not present

## 2015-05-03 DIAGNOSIS — N186 End stage renal disease: Secondary | ICD-10-CM | POA: Diagnosis not present

## 2015-05-05 DIAGNOSIS — Z992 Dependence on renal dialysis: Secondary | ICD-10-CM | POA: Diagnosis not present

## 2015-05-05 DIAGNOSIS — E876 Hypokalemia: Secondary | ICD-10-CM | POA: Diagnosis not present

## 2015-05-05 DIAGNOSIS — N186 End stage renal disease: Secondary | ICD-10-CM | POA: Diagnosis not present

## 2015-05-05 DIAGNOSIS — N031 Chronic nephritic syndrome with focal and segmental glomerular lesions: Secondary | ICD-10-CM | POA: Diagnosis not present

## 2015-05-05 DIAGNOSIS — D688 Other specified coagulation defects: Secondary | ICD-10-CM | POA: Diagnosis not present

## 2015-05-07 DIAGNOSIS — N186 End stage renal disease: Secondary | ICD-10-CM | POA: Diagnosis not present

## 2015-05-07 DIAGNOSIS — D688 Other specified coagulation defects: Secondary | ICD-10-CM | POA: Diagnosis not present

## 2015-05-07 DIAGNOSIS — D631 Anemia in chronic kidney disease: Secondary | ICD-10-CM | POA: Diagnosis not present

## 2015-05-07 DIAGNOSIS — E876 Hypokalemia: Secondary | ICD-10-CM | POA: Diagnosis not present

## 2015-05-09 ENCOUNTER — Inpatient Hospital Stay (HOSPITAL_COMMUNITY)
Admission: EM | Admit: 2015-05-09 | Discharge: 2015-05-11 | DRG: 896 | Disposition: A | Discharge status: Home / Self Care | Payer: Medicare Other | Attending: Internal Medicine | Admitting: Internal Medicine

## 2015-05-09 ENCOUNTER — Emergency Department (HOSPITAL_COMMUNITY): Payer: Medicare Other

## 2015-05-09 ENCOUNTER — Encounter (HOSPITAL_COMMUNITY): Payer: Self-pay

## 2015-05-09 DIAGNOSIS — G40909 Epilepsy, unspecified, not intractable, without status epilepticus: Secondary | ICD-10-CM | POA: Diagnosis not present

## 2015-05-09 DIAGNOSIS — K219 Gastro-esophageal reflux disease without esophagitis: Secondary | ICD-10-CM | POA: Diagnosis present

## 2015-05-09 DIAGNOSIS — E871 Hypo-osmolality and hyponatremia: Secondary | ICD-10-CM | POA: Diagnosis not present

## 2015-05-09 DIAGNOSIS — R011 Cardiac murmur, unspecified: Secondary | ICD-10-CM

## 2015-05-09 DIAGNOSIS — M329 Systemic lupus erythematosus, unspecified: Secondary | ICD-10-CM | POA: Diagnosis present

## 2015-05-09 DIAGNOSIS — Z681 Body mass index (BMI) 19 or less, adult: Secondary | ICD-10-CM | POA: Diagnosis not present

## 2015-05-09 DIAGNOSIS — E43 Unspecified severe protein-calorie malnutrition: Secondary | ICD-10-CM | POA: Diagnosis not present

## 2015-05-09 DIAGNOSIS — Y908 Blood alcohol level of 240 mg/100 ml or more: Secondary | ICD-10-CM | POA: Diagnosis present

## 2015-05-09 DIAGNOSIS — Z79899 Other long term (current) drug therapy: Secondary | ICD-10-CM

## 2015-05-09 DIAGNOSIS — Z87891 Personal history of nicotine dependence: Secondary | ICD-10-CM | POA: Diagnosis not present

## 2015-05-09 DIAGNOSIS — F1020 Alcohol dependence, uncomplicated: Secondary | ICD-10-CM

## 2015-05-09 DIAGNOSIS — F1010 Alcohol abuse, uncomplicated: Principal | ICD-10-CM | POA: Diagnosis present

## 2015-05-09 DIAGNOSIS — R0902 Hypoxemia: Secondary | ICD-10-CM | POA: Diagnosis present

## 2015-05-09 DIAGNOSIS — R55 Syncope and collapse: Secondary | ICD-10-CM | POA: Diagnosis not present

## 2015-05-09 DIAGNOSIS — F329 Major depressive disorder, single episode, unspecified: Secondary | ICD-10-CM | POA: Diagnosis present

## 2015-05-09 DIAGNOSIS — I5032 Chronic diastolic (congestive) heart failure: Secondary | ICD-10-CM | POA: Diagnosis not present

## 2015-05-09 DIAGNOSIS — E781 Pure hyperglyceridemia: Secondary | ICD-10-CM | POA: Diagnosis present

## 2015-05-09 DIAGNOSIS — G4700 Insomnia, unspecified: Secondary | ICD-10-CM | POA: Diagnosis not present

## 2015-05-09 DIAGNOSIS — N189 Chronic kidney disease, unspecified: Secondary | ICD-10-CM | POA: Diagnosis not present

## 2015-05-09 DIAGNOSIS — I132 Hypertensive heart and chronic kidney disease with heart failure and with stage 5 chronic kidney disease, or end stage renal disease: Secondary | ICD-10-CM | POA: Diagnosis present

## 2015-05-09 DIAGNOSIS — D631 Anemia in chronic kidney disease: Secondary | ICD-10-CM | POA: Diagnosis not present

## 2015-05-09 DIAGNOSIS — S0081XA Abrasion of other part of head, initial encounter: Secondary | ICD-10-CM | POA: Diagnosis not present

## 2015-05-09 DIAGNOSIS — D649 Anemia, unspecified: Secondary | ICD-10-CM | POA: Diagnosis not present

## 2015-05-09 DIAGNOSIS — G312 Degeneration of nervous system due to alcohol: Secondary | ICD-10-CM

## 2015-05-09 DIAGNOSIS — Z992 Dependence on renal dialysis: Secondary | ICD-10-CM | POA: Diagnosis not present

## 2015-05-09 DIAGNOSIS — R404 Transient alteration of awareness: Secondary | ICD-10-CM | POA: Diagnosis not present

## 2015-05-09 DIAGNOSIS — G9340 Encephalopathy, unspecified: Secondary | ICD-10-CM | POA: Diagnosis present

## 2015-05-09 DIAGNOSIS — M13819 Other specified arthritis, unspecified shoulder: Secondary | ICD-10-CM | POA: Diagnosis not present

## 2015-05-09 DIAGNOSIS — N186 End stage renal disease: Secondary | ICD-10-CM

## 2015-05-09 DIAGNOSIS — M3214 Glomerular disease in systemic lupus erythematosus: Secondary | ICD-10-CM

## 2015-05-09 DIAGNOSIS — Z9981 Dependence on supplemental oxygen: Secondary | ICD-10-CM

## 2015-05-09 DIAGNOSIS — I129 Hypertensive chronic kidney disease with stage 1 through stage 4 chronic kidney disease, or unspecified chronic kidney disease: Secondary | ICD-10-CM | POA: Diagnosis not present

## 2015-05-09 DIAGNOSIS — I5042 Chronic combined systolic (congestive) and diastolic (congestive) heart failure: Secondary | ICD-10-CM | POA: Diagnosis present

## 2015-05-09 LAB — CBC
HEMATOCRIT: 33.2 % — AB (ref 36.0–46.0)
HEMOGLOBIN: 10.9 g/dL — AB (ref 12.0–15.0)
MCH: 31.2 pg (ref 26.0–34.0)
MCHC: 32.8 g/dL (ref 30.0–36.0)
MCV: 95.1 fL (ref 78.0–100.0)
PLATELETS: 194 10*3/uL (ref 150–400)
RBC: 3.49 MIL/uL — AB (ref 3.87–5.11)
RDW: 14.2 % (ref 11.5–15.5)
WBC: 4.9 10*3/uL (ref 4.0–10.5)

## 2015-05-09 LAB — BASIC METABOLIC PANEL
Anion gap: 9 (ref 5–15)
BUN: 23 mg/dL — ABNORMAL HIGH (ref 6–20)
CHLORIDE: 92 mmol/L — AB (ref 101–111)
CO2: 24 mmol/L (ref 22–32)
CREATININE: 3.9 mg/dL — AB (ref 0.44–1.00)
Calcium: 8 mg/dL — ABNORMAL LOW (ref 8.9–10.3)
GFR calc non Af Amer: 12 mL/min — ABNORMAL LOW (ref >60)
GFR, EST AFRICAN AMERICAN: 14 mL/min — AB (ref >60)
Glucose, Bld: 104 mg/dL — ABNORMAL HIGH (ref 65–99)
POTASSIUM: 4.6 mmol/L (ref 3.5–5.1)
Sodium: 125 mmol/L — ABNORMAL LOW (ref 135–145)

## 2015-05-09 LAB — ETHANOL: Alcohol, Ethyl (B): 412 mg/dL (ref <5)

## 2015-05-09 LAB — CBG MONITORING, ED: Glucose-Capillary: 86 mg/dL (ref 65–99)

## 2015-05-09 LAB — MRSA PCR SCREENING: MRSA by PCR: NEGATIVE

## 2015-05-09 MED ORDER — THIAMINE HCL 100 MG/ML IJ SOLN: 100.0000 mg | Freq: Every day | INTRAMUSCULAR | Status: DC

## 2015-05-09 MED ORDER — LORAZEPAM 2 MG/ML IJ SOLN
1.0000 mg | Freq: Four times a day (QID) | INTRAMUSCULAR | Status: DC | PRN
Start: 2015-05-09 — End: 2015-05-11

## 2015-05-09 MED ORDER — LORATADINE 10 MG PO TABS
10.0000 mg | ORAL_TABLET | Freq: Every day | ORAL | Status: DC
  Administered 2015-05-09 – 2015-05-11 (×3): 10 mg via ORAL
  Filled 2015-05-09 (×2): qty 1

## 2015-05-09 MED ORDER — ADULT MULTIVITAMIN W/MINERALS CH
1.0000 | ORAL_TABLET | Freq: Every day | ORAL | Status: DC
  Administered 2015-05-09 – 2015-05-11 (×3): 1 via ORAL
  Filled 2015-05-09 (×3): qty 1

## 2015-05-09 MED ORDER — CAMPHOR-MENTHOL 0.5-0.5 % EX LOTN
1.0000 "application " | TOPICAL_LOTION | Freq: Every day | CUTANEOUS | Status: DC | PRN
  Filled 2015-05-09: qty 222

## 2015-05-09 MED ORDER — SODIUM CHLORIDE 0.9 % IJ SOLN
3.0000 mL | Freq: Two times a day (BID) | INTRAMUSCULAR | Status: DC
  Administered 2015-05-09 – 2015-05-11 (×3): 3 mL via INTRAVENOUS

## 2015-05-09 MED ORDER — GABAPENTIN 300 MG PO CAPS
300.0000 mg | ORAL_CAPSULE | Freq: Every day | ORAL | Status: DC
Start: 2015-05-09 — End: 2015-05-11
  Administered 2015-05-09 – 2015-05-11 (×3): 300 mg via ORAL
  Filled 2015-05-09 (×4): qty 1

## 2015-05-09 MED ORDER — HEPARIN SODIUM (PORCINE) 5000 UNIT/ML IJ SOLN
5000.0000 [IU] | Freq: Three times a day (TID) | INTRAMUSCULAR | Status: DC
  Administered 2015-05-09 – 2015-05-11 (×6): 5000 [IU] via SUBCUTANEOUS
  Filled 2015-05-09 (×4): qty 1

## 2015-05-09 MED ORDER — ALBUTEROL SULFATE (2.5 MG/3ML) 0.083% IN NEBU: 2.5000 mg | INHALATION_SOLUTION | Freq: Four times a day (QID) | RESPIRATORY_TRACT | Status: DC | PRN

## 2015-05-09 MED ORDER — RAMELTEON 8 MG PO TABS
8.0000 mg | ORAL_TABLET | Freq: Every day | ORAL | Status: DC
  Administered 2015-05-09: 8 mg via ORAL
  Filled 2015-05-09 (×3): qty 1

## 2015-05-09 MED ORDER — ENSURE ENLIVE PO LIQD
237.0000 mL | Freq: Two times a day (BID) | ORAL | Status: DC
Start: 2015-05-10 — End: 2015-05-11
  Administered 2015-05-11 (×2): 237 mL via ORAL

## 2015-05-09 MED ORDER — VITAMIN B-1 100 MG PO TABS
100.0000 mg | ORAL_TABLET | Freq: Every day | ORAL | Status: DC
  Administered 2015-05-09 – 2015-05-11 (×3): 100 mg via ORAL
  Filled 2015-05-09 (×3): qty 1

## 2015-05-09 MED ORDER — HYDROXYCHLOROQUINE SULFATE 200 MG PO TABS
400.0000 mg | ORAL_TABLET | Freq: Every day | ORAL | Status: DC
Start: 2015-05-09 — End: 2015-05-11
  Administered 2015-05-09 – 2015-05-11 (×3): 400 mg via ORAL
  Filled 2015-05-09 (×4): qty 2

## 2015-05-09 MED ORDER — ENSURE PO LIQD: 237.0000 mL | Freq: Two times a day (BID) | ORAL | Status: DC

## 2015-05-09 MED ORDER — VITAMIN B-12 100 MCG PO TABS
250.0000 ug | ORAL_TABLET | Freq: Every evening | ORAL | Status: DC
  Administered 2015-05-09 – 2015-05-10 (×2): 250 ug via ORAL
  Filled 2015-05-09 (×2): qty 3

## 2015-05-09 MED ORDER — FLUOXETINE HCL 10 MG PO CAPS
10.0000 mg | ORAL_CAPSULE | Freq: Every day | ORAL | Status: DC
  Administered 2015-05-09 – 2015-05-11 (×3): 10 mg via ORAL
  Filled 2015-05-09 (×3): qty 1

## 2015-05-09 MED ORDER — PANCRELIPASE (LIP-PROT-AMYL) 12000-38000 UNITS PO CPEP
12000.0000 [IU] | ORAL_CAPSULE | Freq: Three times a day (TID) | ORAL | Status: DC
  Administered 2015-05-10 – 2015-05-11 (×5): 12000 [IU] via ORAL
  Filled 2015-05-09 (×3): qty 1

## 2015-05-09 MED ORDER — SODIUM CHLORIDE 0.9 % IV SOLN: 250.0000 mL | INTRAVENOUS | Status: DC | PRN

## 2015-05-09 MED ORDER — PANTOPRAZOLE SODIUM 40 MG PO TBEC
40.0000 mg | DELAYED_RELEASE_TABLET | Freq: Every day | ORAL | Status: DC
  Administered 2015-05-09 – 2015-05-11 (×3): 40 mg via ORAL
  Filled 2015-05-09 (×3): qty 1

## 2015-05-09 MED ORDER — FOLIC ACID 1 MG PO TABS
1.0000 mg | ORAL_TABLET | Freq: Every day | ORAL | Status: DC
  Administered 2015-05-09 – 2015-05-11 (×3): 1 mg via ORAL
  Filled 2015-05-09 (×4): qty 1

## 2015-05-09 MED ORDER — SODIUM CHLORIDE 0.9 % IJ SOLN: 3.0000 mL | INTRAMUSCULAR | Status: DC | PRN

## 2015-05-09 MED ORDER — ACETAMINOPHEN 650 MG RE SUPP: 650.0000 mg | Freq: Four times a day (QID) | RECTAL | Status: DC | PRN

## 2015-05-09 MED ORDER — ACETAMINOPHEN 325 MG PO TABS
650.0000 mg | ORAL_TABLET | Freq: Four times a day (QID) | ORAL | Status: DC | PRN
  Administered 2015-05-09: 650 mg via ORAL
  Filled 2015-05-09: qty 2

## 2015-05-09 MED ORDER — LORAZEPAM 1 MG PO TABS: 1.0000 mg | ORAL_TABLET | Freq: Four times a day (QID) | ORAL | Status: DC | PRN

## 2015-05-09 MED ORDER — MIRTAZAPINE 7.5 MG PO TABS
7.5000 mg | ORAL_TABLET | Freq: Every day | ORAL | Status: DC
  Administered 2015-05-09 – 2015-05-10 (×2): 7.5 mg via ORAL
  Filled 2015-05-09: qty 1

## 2015-05-09 MED ORDER — SODIUM CHLORIDE 0.9 % IJ SOLN: 3.0000 mL | Freq: Two times a day (BID) | INTRAMUSCULAR | Status: DC

## 2015-05-09 NOTE — ED Notes (Signed)
Attempted Report x1.   
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
## 2015-05-09 NOTE — ED Notes (Signed)
IV Team at the bedside. 

## 2015-05-09 NOTE — ED Notes (Signed)
Phlebotomy at the bedside  

## 2015-05-09 NOTE — ED Notes (Signed)
Attempted to get urine at this time. Unable to do so.

## 2015-05-09 NOTE — ED Notes (Signed)
CBG 86. 

## 2015-05-09 NOTE — ED Notes (Signed)
Pt returned from CT.  Attempted IV x2. Unable to access

## 2015-05-09 NOTE — H&P (Signed)
Date: 05/09/2015               Patient Name:  [name] [name] MRN: [id number]  DOB: 1955-08-20 Age / Sex: 59 y.o., female   PCP: Shela Leff, MD         Medical Service: Internal Medicine Teaching Service         Attending Physician: Dr. Carlyle Basques, MD    First Contact: Dr. Charlynn Grimes Pager: [phone]  Second Contact: Dr. Randell Patient Pager: [phone]       After Hours (After 5p/  First Contact Pager: [phone]  weekends / holidays): Second Contact Pager: [phone]   Chief Complaint: Loss of consciouness  History of Present Illness: [name] [name] is a 59 year old female with a past medical history of ETOH abuse, HTN, ESRD (HD MWF), seizures, and CHF who presents to [facility] ED today after LOC. [name] [name] does not recall the event. She reports that she felt like her normal self this morning and was walking from the kitchen to her bedroom and does not remember anything after that until she was being seen by EMS. She was found by her daughter on the ground unresponsive. She was unconscious for an unknown length of time. She was AAOx4 with no complaints on EMS arrival. She reports she had no eaten this morning. She tells me that she had 3 shots of gin during the day yesterday with no ETOH since. Reported to the ED she had 2 cups of gin this morning. ETOH level was 412 in the ED. She reports drinking 3 shots of gin a week. She denies any symptoms prior to LOC. No dizziness, vision changes, CP, palpations. Denies any loss of bowel or bladder function or biting her tongue and no seizure activity witnessed. Does report some palpations after the episode en route to the hospital that has resolved. Denies any history of palpitations.  Meds: Current Facility-Administered Medications  Medication Dose Route Frequency Provider Last Rate Last Dose  . 0.9 %  sodium chloride infusion  250 mL Intravenous PRN Milagros Loll, MD      . acetaminophen (TYLENOL) tablet 650 mg  650 mg Oral Q6H PRN Milagros Loll, MD   650  mg at 05/09/15 1732   Or  . acetaminophen (TYLENOL) suppository 650 mg  650 mg Rectal Q6H PRN Milagros Loll, MD      . albuterol (PROVENTIL) (2.5 MG/3ML) 0.083% nebulizer solution 2.5 mg  2.5 mg Nebulization Q6H PRN Milagros Loll, MD      . camphor-menthol [facility]) lotion 1 application  1 application Topical Daily PRN Milagros Loll, MD      . Derrill Memo ON 05/10/2015] feeding supplement (ENSURE ENLIVE) (ENSURE ENLIVE) liquid 237 mL  237 mL Oral BID BM Carlyle Basques, MD      . FLUoxetine (PROZAC) capsule 10 mg  10 mg Oral Daily Milagros Loll, MD      . folic acid (FOLVITE) tablet 1 mg  1 mg Oral Daily Milagros Loll, MD      . gabapentin (NEURONTIN) capsule 300 mg  300 mg Oral Daily Milagros Loll, MD      . heparin injection 5,000 Units  5,000 Units Subcutaneous 3 times per day Milagros Loll, MD      . hydroxychloroquine (PLAQUENIL) tablet 400 mg  400 mg Oral Daily Milagros Loll, MD      . Derrill Memo ON 05/10/2015] lipase/protease/amylase (CREON) capsule 12,000 Units  12,000 Units Oral TID AC  Milagros Loll, MD      . loratadine (CLARITIN) tablet 10 mg  10 mg Oral Daily Milagros Loll, MD      . LORazepam (ATIVAN) tablet 1 mg  1 mg Oral Q6H PRN Milagros Loll, MD       Or  . LORazepam (ATIVAN) injection 1 mg  1 mg Intravenous Q6H PRN Milagros Loll, MD      . mirtazapine (REMERON) tablet 7.5 mg  7.5 mg Oral QHS Milagros Loll, MD      . multivitamin with minerals tablet 1 tablet  1 tablet Oral Daily Milagros Loll, MD      . pantoprazole (PROTONIX) EC tablet 40 mg  40 mg Oral Daily Milagros Loll, MD      . ramelteon (ROZEREM) tablet 8 mg  8 mg Oral QHS Milagros Loll, MD      . sodium chloride 0.9 % injection 3 mL  3 mL Intravenous Q12H Milagros Loll, MD      . sodium chloride 0.9 % injection 3 mL  3 mL Intravenous Q12H Milagros Loll, MD      . sodium chloride 0.9 % injection 3 mL  3 mL Intravenous PRN Milagros Loll, MD      . thiamine (VITAMIN B-1) tablet  100 mg  100 mg Oral Daily Milagros Loll, MD       Or  . thiamine (B-1) injection 100 mg  100 mg Intravenous Daily Milagros Loll, MD      . vitamin B-12 (CYANOCOBALAMIN) tablet 250 mcg  250 mcg Oral QPM Milagros Loll, MD        Allergies: Allergies as of 05/09/2015 - Review Complete 05/09/2015  Allergen Reaction Noted  . Amitriptyline hcl Swelling   . Doxycycline hyclate Itching 11/10/2010   Past Medical History  Diagnosis Date  . Anemia, B12 deficiency   . History of acute pancreatitis   . Right knee pain     No recent imaging on chart  . 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 Dr. Mancel Bale, [facility]  . Hypertriglyceridemia   . GERD (gastroesophageal reflux disease)   . Subdural hematoma ([place]) 02/2008    Likely 2/2 trauma from seizure from EtOH withdrawal, chronic in nature, sees Dr. Jerene Bears. Most recent CT head 10/2009 showing stable but persistent hematoma without mass effect.  . History of seizure disorder     Likely 2/2 alcohol abuse  . Hypocalcemia   . Hypomagnesemia   . Failure to thrive in childhood     Unclear etiology  . HTN (hypertension)   . Thrombocytopenia ([place])   . Hepatomegaly     On exam  . Joint pain   . Alcohol abuse   . Vitamin D deficiency   . Pancreatitis   . Insomnia   . Hyperlipidemia   . Pernicious anemia   . Macrocytic anemia   . 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 Depression   . Fx humeral neck 04/17/2011    Transverse fracture- minimally displaced- managed as outpatient   . ABNORMAL PAP SMEAR, LGSIL 07/23/2008    Annotation: HPV positive CIN I Dr. Mancel Bale, [facility] Qualifier: Diagnosis of  By: Oretha Ellis    . Pneumonia 05/20/2012  . Arthritis     "shoulders" (08/15/2013)  . 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  Marland Kitchen Chronic diastolic CHF (congestive heart failure) ([place])   . Hypomagnesemia   .  Seizures ([place])     "don't know when/why I had them; daughter was always there w/me"  . On home oxygen therapy     "3L; mostly at night" (06/19/2014)  . Shortness of breath dyspnea   . Pleural effusion    Past Surgical History  Procedure Laterality Date  . Cesarean section  1983  . Esophagogastroduodenoscopy  07/11/2011    Procedure: ESOPHAGOGASTRODUODENOSCOPY (EGD);  Surgeon: Beryle Beams, MD;  Location: [facility];  Service: Endoscopy;  Laterality: N/A;  . Colonoscopy  07/11/2011    Procedure: COLONOSCOPY;  Surgeon: Beryle Beams, MD;  Location: [facility] ENDOSCOPY;  Service: Endoscopy;  Laterality: N/A;  . Eye surgery Left     "trauma"  . Right colectomy  08/28/2011  . Esophagogastroduodenoscopy N/A 12/01/2012    Procedure: ESOPHAGOGASTRODUODENOSCOPY (EGD);  Surgeon: Irene Shipper, MD;  Location: [facility];  Service: Endoscopy;  Laterality: N/A;  . Colonoscopy with esophagogastroduodenoscopy (egd) Left 08/21/2013    Procedure: COLONOSCOPY WITH ESOPHAGOGASTRODUODENOSCOPY (EGD);  Surgeon: Beryle Beams, MD;  Location: [facility] ENDOSCOPY;  Service: Endoscopy;  Laterality: Left;  . Subxyphoid pericardial window N/A 12/04/2013    Procedure: SUBXYPHOID PERICARDIAL WINDOW WITH TEE;  Surgeon: Ivin Poot, MD;  Location: [place];  Service: Thoracic;  Laterality: N/A;  . Intraoperative transesophageal echocardiogram N/A 12/04/2013    Procedure: INTRAOPERATIVE TRANSESOPHAGEAL ECHOCARDIOGRAM;  Surgeon: Ivin Poot, MD;  Location: [place];  Service: Open Heart Surgery;  Laterality: N/A;  . Exchange of a dialysis catheter Left 10/22/2014    Procedure: EXCHANGE OF A DIALYSIS CATHETER;  Surgeon: Conrad Pewamo, MD;  Location: [place];  Service: Vascular;  Laterality: Left;  . Av fistula placement Left 12/03/2014    Procedure: INSERTION OF ARTERIOVENOUS (AV) GORE-TEX GRAFT ARM;  Surgeon: Mal Misty, MD;  Location: [facility] OR;  Service: Vascular;  Laterality: Left;   Family History  Problem Relation Age of Onset  .  Cancer Mother     Died from stomach cancer and "flesh eating rash  . Heart failure Father     Died in 106s from an MI  . Alcohol abuse Sister     Twin sister drinks a lot, as did both her parents and brothers  . Stroke Brother     Has 7 brothers, 1 with CVA  . Lupus Mother    Social History   Social History  . Marital Status: Divorced    Spouse Name: N/A  . Number of Children: N/A  . Years of Education: N/A   Occupational History  . Not on file.   Social History Main Topics  . Smoking status: Former Smoker -- 0.50 packs/day for 40 years    Types: Cigarettes    Quit date: 09/20/2010  . Smokeless tobacco: Never Used  . Alcohol Use: 0.0 oz/week    0 Standard drinks or equivalent per week     Comment: 12/02/14 "graduated from [employer] (for alcohol abuse) in October 2015"   . Drug Use: Yes    Special: Marijuana, Cocaine     Comment: 12/02/14 "last drug use was in 2012"  . Sexual Activity: Not Currently   Other Topics Concern  . Not on file   Social History Narrative   Lives with her significant other and 2 grandchildren. 1 child   Has 7 brothers and 4 sisters, 1 twin  sister.   Unemployed, worked in [employer].    Abuses alcohol-drinks 1 glass of wine daily    No drug use. Former cigarette use quit 1.5 years ago.     11 th grade education            Review of Systems: Review of Systems  Constitutional: Negative for fever and chills.  Eyes: Negative for blurred vision and double vision.  Respiratory: Negative for shortness of breath.   Cardiovascular: Positive for palpitations. Negative for chest pain and leg swelling.  Gastrointestinal: Negative for nausea, vomiting, abdominal pain, diarrhea, constipation, blood in stool and melena.  Genitourinary: Negative for dysuria.  Musculoskeletal: Negative for myalgias.  Skin: Negative for rash.  Neurological: Positive for loss of consciousness. Negative for dizziness, sensory change, focal weakness, seizures and  headaches.  Psychiatric/Behavioral: Negative for depression.   Physical Exam: Blood pressure 104/85, pulse 92, temperature 98 F (36.7 C), temperature source Oral, resp. rate 17, height 5\' 1"  (1.549 m), weight 87 lb (39.463 kg), SpO2 100 %.   General: alert, malnourished appearing, in NAD and cooperative to examination.  Head: normocephalic with small abrasion noted to right upper eyelid with mild edema present Eyes: vision grossly intact, pupils equal, pupils round, pupils reactive to light, no injection and anicteric.  Mouth: pharynx pink and dry, no erythema, and no exudates.  Neck: supple, full ROM, no thyromegaly, no JVD, and no carotid bruits.  Lungs: normal respiratory effort, no accessory muscle use, normal breath sounds, no crackles, and no wheezes. Heart: normal rate, regular rhythm, no murmur, no gallop, and no rub.  Abdomen: soft, non-tender, normal bowel sounds, no distention, no guarding, no rebound tenderness, no hepatomegaly, and no splenomegaly.  Msk: no joint swelling, no joint warmth, and no redness over joints.  Pulses: 2+ DP/PT pulses bilaterally Extremities: No cyanosis, clubbing, edema Neurologic: alert & oriented X3, cranial nerves II-XII intact, strength normal in all extremities, sensation intact to light touch Skin: warm and dry. No rashes.  Psych: normal mood and affect  Lab results: Basic Metabolic Panel:  Recent Labs  05/09/15 1246  NA 125*  K 4.6  CL 92*  CO2 24  GLUCOSE 104*  BUN 23*  CREATININE 3.90*  CALCIUM 8.0*   Liver Function Tests: No results for input(s): AST, ALT, ALKPHOS, BILITOT, PROT, ALBUMIN in the last 72 hours. No results for input(s): LIPASE, AMYLASE in the last 72 hours. No results for input(s): AMMONIA in the last 72 hours. CBC:  Recent Labs  05/09/15 1246  WBC 4.9  HGB 10.9*  HCT 33.2*  MCV 95.1  PLT 194   Cardiac Enzymes: No results for input(s): CKTOTAL, CKMB, CKMBINDEX, TROPONINI in the last 72 hours. BNP: No  results for input(s): PROBNP in the last 72 hours. D-Dimer: No results for input(s): DDIMER in the last 72 hours. CBG:  Recent Labs  05/09/15 1320  GLUCAP 86   Hemoglobin A1C: No results for input(s): HGBA1C in the last 72 hours. Fasting Lipid Panel: No results for input(s): CHOL, HDL, LDLCALC, TRIG, CHOLHDL, LDLDIRECT in the last 72 hours. Thyroid Function Tests: No results for input(s): TSH, T4TOTAL, FREET4, T3FREE, THYROIDAB in the last 72 hours. Anemia Panel: No results for input(s): VITAMINB12, FOLATE, FERRITIN, TIBC, IRON, RETICCTPCT in the last 72 hours. Coagulation: No results for input(s): LABPROT, INR in the last 72 hours. Urine Drug Screen: Drugs of Abuse     Component Value Date/Time   LABOPIA POSITIVE* 10/13/2014 0412   LABOPIA NEG 09/18/2011 [id number]  COCAINSCRNUR NONE DETECTED 10/13/2014 0412   COCAINSCRNUR NEG 09/18/2011 0936   LABBENZ NONE DETECTED 10/13/2014 0412   LABBENZ NEG 09/18/2011 0936   LABBENZ NEG 04/10/2011 1130   AMPHETMU NONE DETECTED 10/13/2014 0412   AMPHETMU NEG 09/18/2011 0936   AMPHETMU NEG 04/10/2011 1130   THCU NONE DETECTED 10/13/2014 0412   THCU NEG 09/18/2011 0936   LABBARB NONE DETECTED 10/13/2014 0412   LABBARB NEG 09/18/2011 0936    Alcohol Level:  Recent Labs  05/09/15 1246  ETH 412*   Urinalysis: No results for input(s): COLORURINE, LABSPEC, PHURINE, GLUCOSEU, HGBUR, BILIRUBINUR, KETONESUR, PROTEINUR, UROBILINOGEN, NITRITE, LEUKOCYTESUR in the last 72 hours.  Invalid input(s): APPERANCEUR   Imaging results:  Ct Head Wo Contrast  05/09/2015  CLINICAL DATA:  Unwitnessed fall with abrasion to the right face. EXAM: CT HEAD WITHOUT CONTRAST TECHNIQUE: Contiguous axial images were obtained from the base of the skull through the vertex without intravenous contrast. COMPARISON:  04/21/2015 FINDINGS: Brain: No evidence of acute infarction, hemorrhage, extra-axial collection, ventriculomegaly, or mass effect. There is mild brain  parenchymal atrophy. Vascular: No hyperdense vessel or unexpected calcification. Skull: Negative for fracture or focal lesion. Sinuses/Orbits: There is right inferior within the left sphenoid sinus, paranasal sinuses and mastoid air cells are otherwise clear. Other: None. IMPRESSION: No acute intracranial abnormality. Mild brain parenchymal atrophy. Left sphenoid sinusitis. Electronically Signed   By: Fidela Salisbury M.D.   On: 05/09/2015 12:57   Other results: EKG: unchanged from previous tracings, normal sinus rhythm with prolonged PR interval  Assessment & Plan by Problem: Ms. Clasen is a 59 year old female with a history of ETOH abuse presenting with acute LOC and fall with an elevated ETOH level.   Acute encephalopathy with ETOH abuse Ms. Sherouse was found unconscious and responsive. Denies any symptoms prior to episode and denies any seizure activity with no loss of bowel or bladder function and no tongue biting. She reports feeling her normal self before and after the event. She denies any ETOH use today to me but does report ETOH to the ED providers. ETOH level was 412 in the ED. She does have a history of seizures on no medications. ETOH vs postictal vs orthostatic. EKG with PR 213 otherwise unchagned from previous. CT head unremarkable.   - Telemetry - CIWA protocol  - Thiamine and Folate  - Orthostatic vitals - PT eval  Alcohol abuse: drinks gin 3 times a week. Has had seizure from EtOH withdrawal. CIWA protocol  Hyponatremia: Na 125 on admission. 132 on 04/21/2015. Alcohol use in setting of ESRD.  -Appreciate Nephrology consult -HD in am  Holosystolic murmur, hypoxia: New since 02/12/2015. Being worked up as outpt. PFTs consistent with an interstitial process such as fibrosis or interstitial inflammation, but anemia cannot be excluded as a potential cause of the diffusion defect. - Echo ordered.  Severe protein-calorie malnutrition ([place]) -Nutrition consulted  Lupus (systemic  lupus erythematosus) ([place]): On Plaquenil 400 mg daily at home -Continue home medications  ESRD (end stage renal disease) on dialysis [facility]): HD MWF.  -Appreciate Renal Consult -HD tomorrow  DVT PPx: Heparin  CODE: FULL  Dispo: Disposition is deferred at this time, awaiting improvement of current medical problems.  The patient does have a current PCP Shela Leff, MD) and does need an [facility] hospital follow-up appointment after discharge.  The patient does not have transportation limitations that hinder transportation to clinic appointments.  Signed: Maryellen Pile, MD 05/09/2015, 6:57 PM

## 2015-05-09 NOTE — ED Provider Notes (Signed)
CSN: [place]:[id number]     Arrival date & time 05/09/15  1125 History   First MD Initiated Contact with Patient 05/09/15 1127     Chief Complaint  Patient presents with  . Loss of Consciousness     (Consider location/radiation/quality/duration/timing/severity/associated sxs/prior Treatment) HPI   Patient is a 59 year old female with past medical history of alcohol abuse, hypertension, depression, CKD (on hemodialysis MWF), seizures and CHF who presents to the ED via EMS with complaint of reported LOC, onset prior to arrival. EMS report patient's daughter found her laying on the ground unresponsive. EMS report once they were on scene patient was alert and oriented 4 and denied any pain or complaints. Patient reports when she woke up this morning she walked into the kitchen, made breakfast. Patient endorses drinking 2 cups of gin this morning. She notes that she drinks alcohol twice a week. She states the next thing she remembers is waking up on the floor looking at her daughter and the paramedics. Patient denies any pain or complaints at this time. She notes she goes to hemodialysis on Monday Wednesday Friday and states her next treatment is scheduled for tomorrow. Patient notes she wears oxygen at home when necessary.  Past Medical History  Diagnosis Date  . Anemia, B12 deficiency   . History of acute pancreatitis   . Right knee pain     No recent imaging on chart  . 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 Dr. Mancel Bale, [facility]  . Hypertriglyceridemia   . GERD (gastroesophageal reflux disease)   . Subdural hematoma ([place]) 02/2008    Likely 2/2 trauma from seizure from EtOH withdrawal, chronic in nature, sees Dr. Jerene Bears. Most recent CT head 10/2009 showing stable but persistent hematoma without mass effect.  . History of seizure disorder     Likely 2/2 alcohol abuse  . Hypocalcemia   . Hypomagnesemia   . Failure to thrive in  childhood     Unclear etiology  . HTN (hypertension)   . Thrombocytopenia ([place])   . Hepatomegaly     On exam  . Joint pain   . Alcohol abuse   . Vitamin D deficiency   . Pancreatitis   . Insomnia   . Hyperlipidemia   . Pernicious anemia   . Macrocytic anemia   . 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 Depression   . Fx humeral neck 04/17/2011    Transverse fracture- minimally displaced- managed as outpatient   . ABNORMAL PAP SMEAR, LGSIL 07/23/2008    Annotation: HPV positive CIN I Dr. Mancel Bale, [facility] Qualifier: Diagnosis of  By: Oretha Ellis    . Pneumonia 05/20/2012  . Arthritis     "shoulders" (08/15/2013)  . 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  Marland Kitchen Chronic diastolic CHF (congestive heart failure) ([place])   . Hypomagnesemia   . Seizures ([place])     "don't know when/why I had them; daughter was always there w/me"  . On home oxygen therapy     "3L; mostly at night" (06/19/2014)  . Shortness of breath dyspnea   . Pleural effusion    Past Surgical History  Procedure Laterality Date  . Cesarean section  1983  . Esophagogastroduodenoscopy  07/11/2011    Procedure: ESOPHAGOGASTRODUODENOSCOPY (EGD);  Surgeon: Beryle Beams, MD;  Location: [facility];  Service: Endoscopy;  Laterality: N/A;  .  Colonoscopy  07/11/2011    Procedure: COLONOSCOPY;  Surgeon: Beryle Beams, MD;  Location: [facility] ENDOSCOPY;  Service: Endoscopy;  Laterality: N/A;  . Eye surgery Left     "trauma"  . Right colectomy  08/28/2011  . Esophagogastroduodenoscopy N/A 12/01/2012    Procedure: ESOPHAGOGASTRODUODENOSCOPY (EGD);  Surgeon: Irene Shipper, MD;  Location: [facility];  Service: Endoscopy;  Laterality: N/A;  . Colonoscopy with esophagogastroduodenoscopy (egd) Left 08/21/2013    Procedure: COLONOSCOPY WITH ESOPHAGOGASTRODUODENOSCOPY (EGD);  Surgeon: Beryle Beams, MD;  Location: [facility] ENDOSCOPY;  Service: Endoscopy;  Laterality: Left;  . Subxyphoid  pericardial window N/A 12/04/2013    Procedure: SUBXYPHOID PERICARDIAL WINDOW WITH TEE;  Surgeon: Ivin Poot, MD;  Location: [place];  Service: Thoracic;  Laterality: N/A;  . Intraoperative transesophageal echocardiogram N/A 12/04/2013    Procedure: INTRAOPERATIVE TRANSESOPHAGEAL ECHOCARDIOGRAM;  Surgeon: Ivin Poot, MD;  Location: [place];  Service: Open Heart Surgery;  Laterality: N/A;  . Exchange of a dialysis catheter Left 10/22/2014    Procedure: EXCHANGE OF A DIALYSIS CATHETER;  Surgeon: Conrad Fisher, MD;  Location: [place];  Service: Vascular;  Laterality: Left;  . Av fistula placement Left 12/03/2014    Procedure: INSERTION OF ARTERIOVENOUS (AV) GORE-TEX GRAFT ARM;  Surgeon: Mal Misty, MD;  Location: [facility] OR;  Service: Vascular;  Laterality: Left;   Family History  Problem Relation Age of Onset  . Cancer Mother     Died from stomach cancer and "flesh eating rash  . Heart failure Father     Died in 62s from an MI  . Alcohol abuse Sister     Twin sister drinks a lot, as did both her parents and brothers  . Stroke Brother     Has 7 brothers, 1 with CVA  . Lupus Mother    Social History  Substance Use Topics  . Smoking status: Former Smoker -- 0.50 packs/day for 40 years    Types: Cigarettes    Quit date: 09/20/2010  . Smokeless tobacco: Never Used  . Alcohol Use: 0.0 oz/week    0 Standard drinks or equivalent per week     Comment: 12/02/14 "graduated from [employer] (for alcohol abuse) in October 2015"    OB History    No data available     Review of Systems  All other systems reviewed and are negative.     Allergies  Amitriptyline hcl and Doxycycline hyclate  Home Medications   Prior to Admission medications   Medication Sig Start Date End Date Taking? Authorizing Provider  calcium carbonate (TUMS - DOSED IN MG ELEMENTAL CALCIUM) 500 MG chewable tablet Chew 1,000 tablets by mouth at bedtime.   Yes Historical Provider, MD  camphor-menthol Timoteo Ace)  lotion Apply 1 application topically daily as needed for itching.   Yes Historical Provider, MD  cetirizine (ZYRTEC) 10 MG tablet Take 1 tablet (10 mg total) by mouth daily as needed for allergies. Patient taking differently: Take 10 mg by mouth daily.  09/20/14  Yes Otho Bellows, MD  CREON 12000 UNITS CPEP capsule Take 1 capsule (12,000 Units total) by mouth 3 (three) times daily before meals. 01/21/15  Yes Shela Leff, MD  diclofenac sodium (VOLTAREN) 1 % GEL Apply 4 g topically daily.   Yes Historical Provider, MD  ENSURE (ENSURE) Take 237 mLs by mouth 2 (two) times daily between meals.   Yes Historical Provider, MD  FLUoxetine (PROZAC) 10 MG capsule Take 1 capsule (10 mg total)  by mouth daily. For depression 01/21/15  Yes Shela Leff, MD  folic acid (FOLVITE) 1 MG tablet Take 1 tablet (1 mg total) by mouth daily. For folic acid replacement 09/20/14  Yes Otho Bellows, MD  gabapentin (NEURONTIN) 300 MG capsule Take 1 capsule (300 mg total) by mouth daily. 04/09/15 04/06/16 Yes Shela Leff, MD  hydroxychloroquine (PLAQUENIL) 200 MG tablet Take 400 mg by mouth daily.   Yes Historical Provider, MD  Lactobacillus (ACIDOPHILUS PROBIOTIC PO) Take 2 mg by mouth 3 (three) times daily. Take 2 (two) tablets by mouth three times daily   Yes Historical Provider, MD  loperamide (IMODIUM) 2 MG capsule Take 2 mg by mouth every 6 (six) hours as needed for diarrhea or loose stools.   Yes Historical Provider, MD  mirtazapine (REMERON) 7.5 MG tablet Take 1 tablet (7.5 mg total) by mouth at bedtime. 01/21/15  Yes Shela Leff, MD  Multiple Vitamin (MULTIVITAMIN WITH MINERALS) TABS tablet Take 1 tablet by mouth daily. For vitamin replacement 03/24/15  Yes Shela Leff, MD  omeprazole (PRILOSEC) 40 MG capsule Take 1 capsule (40 mg total) by mouth daily. 01/21/15  Yes Shela Leff, MD  ramelteon (ROZEREM) 8 MG tablet Take 1 tablet (8 mg total) by mouth at bedtime. 01/21/15  Yes Shela Leff, MD  thiamine (VITAMIN B-1) 100 MG tablet Take 1 tablet (100 mg total) by mouth daily. For low thiamine 01/21/15  Yes Shela Leff, MD  vitamin B-12 (CYANOCOBALAMIN) 250 MCG tablet Take 1 tablet (250 mcg total) by mouth every evening. 01/21/15  Yes Shela Leff, MD  albuterol (PROAIR HFA) 108 (90 BASE) MCG/ACT inhaler Inhale 1-2 puffs into the lungs every 6 (six) hours as needed for wheezing or shortness of breath. 01/14/14   Jones Bales, MD   BP 109/76 mmHg  Pulse 84  Temp(Src) 97.5 F (36.4 C) (Oral)  Resp 14  Ht 5\' 1"  (1.549 m)  Wt 39.463 kg  BMI 16.45 kg/m2  SpO2 98% Physical Exam  Constitutional: She is oriented to person, place, and time. She appears well-developed and well-nourished. No distress.  HENT:  Head: Normocephalic. Head is with abrasion. Head is without raccoon's eyes, without Battle's sign, without contusion and without laceration.  Right Ear: Tympanic membrane normal. No hemotympanum.  Left Ear: Tympanic membrane normal. No hemotympanum.  Nose: Right sinus exhibits no maxillary sinus tenderness and no frontal sinus tenderness. Left sinus exhibits no maxillary sinus tenderness and no frontal sinus tenderness.  Mouth/Throat: Uvula is midline, oropharynx is clear and moist and mucous membranes are normal. No oropharyngeal exudate.  Small abrasion noted to right upper eyelid with mild surrounding swelling to upper eyelid, no active bleeding.   Eyes: Conjunctivae and EOM are normal. Pupils are equal, round, and reactive to light. Right eye exhibits no discharge. Left eye exhibits no discharge. No scleral icterus.  Neck: Normal range of motion. Neck supple.  Cardiovascular: Normal rate, regular rhythm, normal heart sounds and intact distal pulses.   Pulmonary/Chest: Effort normal and breath sounds normal. No respiratory distress. She has no wheezes. She has no rales. She exhibits no tenderness.  Abdominal: Soft. Bowel sounds are normal. She exhibits no  distension and no mass. There is no tenderness. There is no rebound and no guarding.  Musculoskeletal: Normal range of motion. She exhibits no edema.  Lymphadenopathy:    She has no cervical adenopathy.  Neurological: She is alert and oriented to person, place, and time. She has normal strength and normal reflexes. No cranial  nerve deficit or sensory deficit. She displays a negative Romberg sign. Coordination normal.  Skin: Skin is warm and dry. She is not diaphoretic.  Nursing note and vitals reviewed.   ED Course  Procedures (including critical care time) Labs Review Labs Reviewed  BASIC METABOLIC PANEL - Abnormal; Notable for the following:    Sodium 125 (*)    Chloride 92 (*)    Glucose, Bld 104 (*)    BUN 23 (*)    Creatinine, Ser 3.90 (*)    Calcium 8.0 (*)    GFR calc non Af Amer 12 (*)    GFR calc Af Amer 14 (*)    All other components within normal limits  CBC - Abnormal; Notable for the following:    RBC 3.49 (*)    Hemoglobin 10.9 (*)    HCT 33.2 (*)    All other components within normal limits  ETHANOL - Abnormal; Notable for the following:    Alcohol, Ethyl (B) 412 (*)    All other components within normal limits  URINALYSIS, ROUTINE W REFLEX MICROSCOPIC (NOT AT [facility])  CBG MONITORING, ED    Imaging Review Ct Head Wo Contrast  05/09/2015  CLINICAL DATA:  Unwitnessed fall with abrasion to the right face. EXAM: CT HEAD WITHOUT CONTRAST TECHNIQUE: Contiguous axial images were obtained from the base of the skull through the vertex without intravenous contrast. COMPARISON:  04/21/2015 FINDINGS: Brain: No evidence of acute infarction, hemorrhage, extra-axial collection, ventriculomegaly, or mass effect. There is mild brain parenchymal atrophy. Vascular: No hyperdense vessel or unexpected calcification. Skull: Negative for fracture or focal lesion. Sinuses/Orbits: There is right inferior within the left sphenoid sinus, paranasal sinuses and mastoid air cells are otherwise  clear. Other: None. IMPRESSION: No acute intracranial abnormality. Mild brain parenchymal atrophy. Left sphenoid sinusitis. Electronically Signed   By: Fidela Salisbury M.D.   On: 05/09/2015 12:57   I have personally reviewed and evaluated these images and lab results as part of my medical decision-making.   EKG Interpretation None      MDM   Final diagnoses:  Hyponatremia  Alcohol abuse  Chronic kidney disease, unspecified stage    Pt presents with reported LOC by daughter. Pt denies any complaints on arrival to the ED. Hx of CKD (on hemodialysis), EtOH abuse, seizures, CHF and HTN. Pt reports drinking alcohol this morning.   VSS. Exam revealed small abrasion to right upper eyelid with mild surrounding swelling, exam otherwise unremarkable. No neuro deficits. Sodium 125, ethanol 412, labs otherwise at pt's baseline. CT head showed no acute abnormality.   Consulted Internal medicine, agree to admission. Consulted nephrology, notified about pt's presentation and plan for admission. Discussed results and plan for admission with pt.   Chesley Noon Gratis, [place] 05/09/15 1541  Courteney Julio Alm, MD 05/09/15 1555

## 2015-05-09 NOTE — Consult Note (Signed)
Renal Service Consult Note [facility]  [name] [name] 05/09/2015 Sol Blazing Requesting Physician:  Dr Baxter Flattery  Reason for Consult:  ESRD patient with LOC episode HPI: The patient is a 59 y.o. year-old with hx of lupus/ ESRD, HTN, dCHF, seziures and etoh abuse was found unresponsive at home by family on the floor today.  When EMS arrived she was awakened and alert.  Abrasion to R eye. +etoh today. VS ok. Admitted. Asked to see for HD tomorrow.    Chart review: Mar 13 - lap chole Apr 13 - seizure, low Mg, etoh abuse, depression , HTN, FTT Dec 13 - CAP d/t strep pna, wt loss, etoh, AKI resolved Mar 14 - bilat LE edema, etoh, seizures, AKI/ ^k Jun 14 - psych admission Jun 14 - etoh, UGIB, AKI Mar 15 - sepsis, GIB, HCAP, etoh, FTT, severe PCM Jun 15 - acute resp faiulre/ pulm edema, a/c diast HF, a/c renal failure, low BP Jul 15 - a/c renal failure w FSGS, pericarditis w hx p window procedure, HTN, low Ca, dCHF Jan 16 - ^LFTS, etoh, CKD 4, chron etoh abuse, MBD, chron panc Apr 16 - SOB, anemia, etoh, depression, HTN, CKD 3/4, seizure d/o , FSGS, sys/diast HF May 16 - lupus and HTN, CKD admitted with ^'d creat; a/c renal faiulre, started on hemodialysis. Thoracentesis for symptomatic effusion. OP HD in [place].  Jul 16 - red/ pain of arm after AVG placed > AVG reaction vs infection. Abx dc'd , VVS felt more likely not infection and prob AVG reaction. DC'd, fevers resolved.    ROS  no n/v/d  no CP, sob, cough, orthop  no abd pain  no dysuria  no HA  no seziure, twitching  no HA  Past Medical History  Past Medical History  Diagnosis Date  . Anemia, B12 deficiency   . History of acute pancreatitis   . Right knee pain     No recent imaging on chart  . 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 Dr. Mancel Bale, [facility]  . Hypertriglyceridemia   . GERD (gastroesophageal reflux disease)   .  Subdural hematoma ([place]) 02/2008    Likely 2/2 trauma from seizure from EtOH withdrawal, chronic in nature, sees Dr. Jerene Bears. Most recent CT head 10/2009 showing stable but persistent hematoma without mass effect.  . History of seizure disorder     Likely 2/2 alcohol abuse  . Hypocalcemia   . Hypomagnesemia   . Failure to thrive in childhood     Unclear etiology  . HTN (hypertension)   . Thrombocytopenia ([place])   . Hepatomegaly     On exam  . Joint pain   . Alcohol abuse   . Vitamin D deficiency   . Pancreatitis   . Insomnia   . Hyperlipidemia   . Pernicious anemia   . Macrocytic anemia   . 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 Depression   . Fx humeral neck 04/17/2011    Transverse fracture- minimally displaced- managed as outpatient   . ABNORMAL PAP SMEAR, LGSIL 07/23/2008    Annotation: HPV positive CIN I Dr. Mancel Bale, [facility] Qualifier: Diagnosis of  By: Oretha Ellis    . Pneumonia 05/20/2012  . Arthritis     "shoulders" (08/15/2013)  . 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  Marland Kitchen Chronic diastolic CHF (congestive  heart failure) ([place])   . Hypomagnesemia   . Seizures ([place])     "don't know when/why I had them; daughter was always there w/me"  . On home oxygen therapy     "3L; mostly at night" (06/19/2014)  . Shortness of breath dyspnea   . Pleural effusion    Past Surgical History  Past Surgical History  Procedure Laterality Date  . Cesarean section  1983  . Esophagogastroduodenoscopy  07/11/2011    Procedure: ESOPHAGOGASTRODUODENOSCOPY (EGD);  Surgeon: Beryle Beams, MD;  Location: [facility];  Service: Endoscopy;  Laterality: N/A;  . Colonoscopy  07/11/2011    Procedure: COLONOSCOPY;  Surgeon: Beryle Beams, MD;  Location: [facility];  Service: Endoscopy;  Laterality: N/A;  . Eye surgery Left     "trauma"  . Right colectomy  08/28/2011  . Esophagogastroduodenoscopy N/A 12/01/2012    Procedure:  ESOPHAGOGASTRODUODENOSCOPY (EGD);  Surgeon: Irene Shipper, MD;  Location: [facility];  Service: Endoscopy;  Laterality: N/A;  . Colonoscopy with esophagogastroduodenoscopy (egd) Left 08/21/2013    Procedure: COLONOSCOPY WITH ESOPHAGOGASTRODUODENOSCOPY (EGD);  Surgeon: Beryle Beams, MD;  Location[facility] ENDOSCOPY;  Service: Endoscopy;  Laterality: Left;  . Subxyphoid pericardial window N/A 12/04/2013    Procedure: SUBXYPHOID PERICARDIAL WINDOW WITH TEE;  Surgeon: Ivin Poot, MD;  Location: [place];  Service: Thoracic;  Laterality: N/A;  . Intraoperative transesophageal echocardiogram N/A 12/04/2013    Procedure: INTRAOPERATIVE TRANSESOPHAGEAL ECHOCARDIOGRAM;  Surgeon: Ivin Poot, MD;  Location: [place];  Service: Open Heart Surgery;  Laterality: N/A;  . Exchange of a dialysis catheter Left 10/22/2014    Procedure: EXCHANGE OF A DIALYSIS CATHETER;  Surgeon: Conrad Highpoint, MD;  Location: [place];  Service: Vascular;  Laterality: Left;  . Av fistula placement Left 12/03/2014    Procedure: INSERTION OF ARTERIOVENOUS (AV) GORE-TEX GRAFT ARM;  Surgeon: Mal Misty, MD;  Location: [facility] OR;  Service: Vascular;  Laterality: Left;   Family History  Family History  Problem Relation Age of Onset  . Cancer Mother     Died from stomach cancer and "flesh eating rash  . Heart failure Father     Died in 58s from an MI  . Alcohol abuse Sister     Twin sister drinks a lot, as did both her parents and brothers  . Stroke Brother     Has 7 brothers, 1 with CVA  . Lupus Mother    Social History  reports that she quit smoking about 4 years ago. Her smoking use included Cigarettes. She has a 20 pack-year smoking history. She has never used smokeless tobacco. She reports that she drinks alcohol. She reports that she uses illicit drugs (Marijuana and Cocaine). Allergies  Allergies  Allergen Reactions  . Amitriptyline Hcl Swelling    In the face.  . Doxycycline Hyclate Itching    Feels like something crawling under  her skin   Home medications Prior to Admission medications   Medication Sig Start Date End Date Taking? Authorizing Provider  calcium carbonate (TUMS - DOSED IN MG ELEMENTAL CALCIUM) 500 MG chewable tablet Chew 1,000 tablets by mouth at bedtime.   Yes Historical Provider, MD  camphor-menthol Timoteo Ace) lotion Apply 1 application topically daily as needed for itching.   Yes Historical Provider, MD  cetirizine (ZYRTEC) 10 MG tablet Take 1 tablet (10 mg total) by mouth daily as needed for allergies. Patient taking differently: Take 10 mg by mouth daily.  09/20/14  Yes Otho Bellows, MD  CREON 12000 UNITS CPEP capsule Take 1 capsule (12,000 Units total) by mouth 3 (three) times daily before meals. 01/21/15  Yes Shela Leff, MD  diclofenac sodium (VOLTAREN) 1 % GEL Apply 4 g topically daily.   Yes Historical Provider, MD  ENSURE (ENSURE) Take 237 mLs by mouth 2 (two) times daily between meals.   Yes Historical Provider, MD  FLUoxetine (PROZAC) 10 MG capsule Take 1 capsule (10 mg total) by mouth daily. For depression 01/21/15  Yes Shela Leff, MD  folic acid (FOLVITE) 1 MG tablet Take 1 tablet (1 mg total) by mouth daily. For folic acid replacement 09/20/14  Yes Otho Bellows, MD  gabapentin (NEURONTIN) 300 MG capsule Take 1 capsule (300 mg total) by mouth daily. 04/09/15 04/06/16 Yes Shela Leff, MD  hydroxychloroquine (PLAQUENIL) 200 MG tablet Take 400 mg by mouth daily.   Yes Historical Provider, MD  Lactobacillus (ACIDOPHILUS PROBIOTIC PO) Take 2 mg by mouth 3 (three) times daily. Take 2 (two) tablets by mouth three times daily   Yes Historical Provider, MD  loperamide (IMODIUM) 2 MG capsule Take 2 mg by mouth every 6 (six) hours as needed for diarrhea or loose stools.   Yes Historical Provider, MD  mirtazapine (REMERON) 7.5 MG tablet Take 1 tablet (7.5 mg total) by mouth at bedtime. 01/21/15  Yes Shela Leff, MD  Multiple Vitamin (MULTIVITAMIN WITH MINERALS) TABS tablet Take 1  tablet by mouth daily. For vitamin replacement 03/24/15  Yes Shela Leff, MD  omeprazole (PRILOSEC) 40 MG capsule Take 1 capsule (40 mg total) by mouth daily. 01/21/15  Yes Shela Leff, MD  ramelteon (ROZEREM) 8 MG tablet Take 1 tablet (8 mg total) by mouth at bedtime. 01/21/15  Yes Shela Leff, MD  thiamine (VITAMIN B-1) 100 MG tablet Take 1 tablet (100 mg total) by mouth daily. For low thiamine 01/21/15  Yes Shela Leff, MD  vitamin B-12 (CYANOCOBALAMIN) 250 MCG tablet Take 1 tablet (250 mcg total) by mouth every evening. 01/21/15  Yes Shela Leff, MD  albuterol (PROAIR HFA) 108 (90 BASE) MCG/ACT inhaler Inhale 1-2 puffs into the lungs every 6 (six) hours as needed for wheezing or shortness of breath. 01/14/14   Jones Bales, MD   Liver Function Tests No results for input(s): AST, ALT, ALKPHOS, BILITOT, PROT, ALBUMIN in the last 168 hours. No results for input(s): LIPASE, AMYLASE in the last 168 hours. CBC  Recent Labs Lab 05/09/15 1246  WBC 4.9  HGB 10.9*  HCT 33.2*  MCV 95.1  PLT [id number]   Basic Metabolic Panel  Recent Labs Lab 05/09/15 1246  NA 125*  K 4.6  CL 92*  CO2 24  GLUCOSE 104*  BUN 23*  CREATININE 3.90*  CALCIUM 8.0*    Filed Vitals:   05/09/15 1600 05/09/15 1630 05/09/15 1638 05/09/15 1740  BP: 95/71 99/85  104/85  Pulse: 87 88  92  Temp:   97.7 F (36.5 C) 98 F (36.7 C)  TempSrc:    Oral  Resp: 25 16  17   Height:      Weight:      SpO2: 100% 100%  100%   Exam Small framed AAF no distress, calm. Sits and stands w/o difficulty, standing pulse unchanged No rash, cyanosis or gangrene Sclera anicteric, throat clear No jvd or bruits Chest rales on L base clear w inspiration, R clear RRR no mrg Abd soft scaphoid +bs no ascites MS no joint effusion/ deformity Ext no leg or UE edema, no ulcers or  wounds LUA AVG +bruit, no lesions/ drainage Neuro is alert, pleasant O x 3  [facility] [place]  4h  F160  37kg  4k/2.25 bath  Heparin 2000   LUA AVG Calcitriol 1.0 ug Mircera 50 q 2wks last 12/2 Last Hb 10.4 tfs 44%  Ca 7.9 P 3.8 pth 752   Assessment: 1 LOC episode - poss d/t etoh, or postictal, vs other 2 ESRD 3 Etoh abuse 4 Seizure d/o only on neurontin 5 Hx lupus on plaquenil 6 Anemia esa/ fe 7 MBD vit D 8 Psych on remeron/ prozac 9 Vol is 2kg up , stable  Plan - HD Monday, UF to dry wt  Kelly Splinter MD [facility] pager [phone]    cell [phone] 05/09/2015, 6:17 PM

## 2015-05-10 ENCOUNTER — Ambulatory Visit (HOSPITAL_COMMUNITY): Payer: Medicare Other

## 2015-05-10 DIAGNOSIS — M329 Systemic lupus erythematosus, unspecified: Secondary | ICD-10-CM

## 2015-05-10 DIAGNOSIS — G9340 Encephalopathy, unspecified: Secondary | ICD-10-CM

## 2015-05-10 DIAGNOSIS — R011 Cardiac murmur, unspecified: Secondary | ICD-10-CM

## 2015-05-10 LAB — GLUCOSE, CAPILLARY
GLUCOSE-CAPILLARY: 143 mg/dL — AB (ref 65–99)
GLUCOSE-CAPILLARY: 40 mg/dL — AB (ref 65–99)
GLUCOSE-CAPILLARY: 121 mg/dL — AB (ref 65–99)
Glucose-Capillary: 44 mg/dL — CL (ref 65–99)
Glucose-Capillary: 109 mg/dL — ABNORMAL HIGH (ref 65–99)

## 2015-05-10 LAB — RENAL FUNCTION PANEL
ALBUMIN: 2.3 g/dL — AB (ref 3.5–5.0)
ANION GAP: 10 (ref 5–15)
BUN: 29 mg/dL — ABNORMAL HIGH (ref 6–20)
CO2: 23 mmol/L (ref 22–32)
Calcium: 7.8 mg/dL — ABNORMAL LOW (ref 8.9–10.3)
Chloride: 91 mmol/L — ABNORMAL LOW (ref 101–111)
Creatinine, Ser: 4.02 mg/dL — ABNORMAL HIGH (ref 0.44–1.00)
GFR calc non Af Amer: 11 mL/min — ABNORMAL LOW (ref >60)
GFR, EST AFRICAN AMERICAN: 13 mL/min — AB (ref >60)
GLUCOSE: 87 mg/dL (ref 65–99)
PHOSPHORUS: 3.3 mg/dL (ref 2.5–4.6)
POTASSIUM: 5.2 mmol/L — AB (ref 3.5–5.1)
Sodium: 124 mmol/L — ABNORMAL LOW (ref 135–145)

## 2015-05-10 LAB — BASIC METABOLIC PANEL
ANION GAP: 13 (ref 5–15)
BUN: 7 mg/dL (ref 6–20)
CHLORIDE: 95 mmol/L — AB (ref 101–111)
CO2: 22 mmol/L (ref 22–32)
Calcium: 8.3 mg/dL — ABNORMAL LOW (ref 8.9–10.3)
Creatinine, Ser: 1.86 mg/dL — ABNORMAL HIGH (ref 0.44–1.00)
GFR calc non Af Amer: 29 mL/min — ABNORMAL LOW (ref >60)
GFR, EST AFRICAN AMERICAN: 33 mL/min — AB (ref >60)
GLUCOSE: 151 mg/dL — AB (ref 65–99)
Potassium: 4.1 mmol/L (ref 3.5–5.1)
Sodium: 130 mmol/L — ABNORMAL LOW (ref 135–145)

## 2015-05-10 LAB — CBC
HEMATOCRIT: 29.8 % — AB (ref 36.0–46.0)
Hemoglobin: 10 g/dL — ABNORMAL LOW (ref 12.0–15.0)
MCH: 31.5 pg (ref 26.0–34.0)
MCHC: 33.6 g/dL (ref 30.0–36.0)
MCV: 94 fL (ref 78.0–100.0)
Platelets: 201 10*3/uL (ref 150–400)
RBC: 3.17 MIL/uL — AB (ref 3.87–5.11)
RDW: 14.1 % (ref 11.5–15.5)
WBC: 7.3 10*3/uL (ref 4.0–10.5)

## 2015-05-10 LAB — MAGNESIUM: Magnesium: 1.7 mg/dL (ref 1.7–2.4)

## 2015-05-10 MED ORDER — HEPARIN SODIUM (PORCINE) 1000 UNIT/ML DIALYSIS: 2000.0000 [IU] | Freq: Once | INTRAMUSCULAR | Status: DC

## 2015-05-10 MED ORDER — HEPARIN SODIUM (PORCINE) 1000 UNIT/ML DIALYSIS: 1000.0000 [IU] | INTRAMUSCULAR | Status: DC | PRN

## 2015-05-10 MED ORDER — LIDOCAINE-PRILOCAINE 2.5-2.5 % EX CREA: 1.0000 "application " | TOPICAL_CREAM | CUTANEOUS | Status: DC | PRN

## 2015-05-10 MED ORDER — LIDOCAINE HCL (PF) 1 % IJ SOLN: 5.0000 mL | INTRAMUSCULAR | Status: DC | PRN

## 2015-05-10 MED ORDER — PENTAFLUOROPROP-TETRAFLUOROETH EX AERO: 1.0000 "application " | INHALATION_SPRAY | CUTANEOUS | Status: DC | PRN

## 2015-05-10 MED ORDER — SODIUM CHLORIDE 0.9 % IV SOLN: 100.0000 mL | INTRAVENOUS | Status: DC | PRN

## 2015-05-10 MED ORDER — ALTEPLASE 2 MG IJ SOLR: 2.0000 mg | Freq: Once | INTRAMUSCULAR | Status: DC | PRN

## 2015-05-10 MED ORDER — DEXTROSE 50 % IV SOLN
INTRAVENOUS | Status: AC
  Administered 2015-05-10: 50 mL
  Filled 2015-05-10: qty 50

## 2015-05-10 NOTE — Progress Notes (Signed)
Pt back from dialysis CBG 44, pt alert and oriented, skin warm and dry.  Pt asymptomatic.  Gave juice and snack with recheck.

## 2015-05-10 NOTE — Progress Notes (Signed)
cbg 40 on recheck gave D50 25 ml, cbg 143

## 2015-05-10 NOTE — Progress Notes (Signed)
Initial Nutrition Assessment  DOCUMENTATION CODES:   Severe malnutrition in context of chronic illness, Underweight  INTERVENTION:  Continue Ensure Enlive po BID, each supplement provides 350 kcal and 20 grams of protein.  Encourage adequate PO intake.   NUTRITION DIAGNOSIS:   Malnutrition related to chronic illness as evidenced by severe depletion of body fat, severe depletion of muscle mass.  GOAL:   Patient will meet greater than or equal to 90% of their needs  MONITOR:   PO intake, Supplement acceptance, Weight trends, Labs, I & O's  REASON FOR ASSESSMENT:   Consult Assessment of nutrition requirement/status  ASSESSMENT:   59 year old female with a past medical history of ETOH abuse, HTN, ESRD (HD MWF), seizures, and CHF who presents after LOC.  Pt reports having a decreased appetite since admission. Pt reports eating well PTA with consumption of at least 3 meals a day with consumption of Ensure TID. Pt with a 8% weight loss in 1 month. Pt reports usual body weight of ~85 lbs. No meal completion recorded. Pt reports 0% intake today. Pt currently has Ensure ordered. RD to continue with current orders. Pt was encouraged to eat her food at meals and to drink her supplements.   Nutrition-Focused physical exam completed. Findings are severe fat depletion, severe muscle depletion, and no edema.   Potassium elevated at 5.2.  Diet Order:  Diet renal with fluid restriction Fluid restriction:: 1200 mL Fluid; Room service appropriate?: Yes; Fluid consistency:: Thin  Skin:  Reviewed, no issues  Last BM:  PTA  Height:   Ht Readings from Last 1 Encounters:  05/09/15 5\' 1"  (1.549 m)    Weight:   Wt Readings from Last 1 Encounters:  05/10/15 80 lb 11 oz (36.6 kg)    Ideal Body Weight:  47.7 kg  BMI:  Body mass index is 15.25 kg/(m^2).  Estimated Nutritional Needs:   Kcal:  1400-1600  Protein:  55-65 grams  Fluid:  1.2 L/day  EDUCATION NEEDS:   No education  needs identified at this time  Corrin Parker, MS, RD, LDN Pager # [phone] After hours/ weekend pager # [phone]

## 2015-05-10 NOTE — Progress Notes (Signed)
Subjective: [name] [name] reports no complaints this morning. Denies any palpitations, chest pain, dizziness, shortness of breath. No further episodes of syncope.   Objective: Vital signs in last 24 hours: Filed Vitals:   05/10/15 1014 05/10/15 1047 05/10/15 1115 05/10/15 1134  BP: 101/68 137/85 127/87 136/80  Pulse: 84 87 92 81  Temp:    97.9 F (36.6 C)  TempSrc:    Oral  Resp: 16 20 18 18   Height:      Weight:    80 lb 11 oz (36.6 kg)  SpO2:       Weight change:   Intake/Output Summary (Last 24 hours) at 05/10/15 1542 Last data filed at 05/10/15 1134  Gross per 24 hour  Intake    240 ml  Output   1199 ml  Net   -959 ml    PHYSICAL EXAM GENERAL- alert, co-operative, malnourished appearing, not in any distress. HEENT- Atraumatic, normocephalic, oral mucosa appears moist CARDIAC- RRR, no murmurs, rubs or gallops. RESP- Moving equal volumes of air, and clear to auscultation bilaterally, no wheezes or crackles. ABDOMEN- Soft, nontender, bowel sounds present. NEURO- No obvious Cr N abnormality. EXTREMITIES- pulse 2+, symmetric, no pedal edema. SKIN- Warm, dry, No rash or lesion. PSYCH- Normal mood and affect, appropriate thought content and speech.  Medications: I have reviewed the patient's current medications. Scheduled Meds: . feeding supplement (ENSURE ENLIVE)  237 mL Oral BID BM  . FLUoxetine  10 mg Oral Daily  . folic acid  1 mg Oral Daily  . gabapentin  300 mg Oral Daily  . heparin  5,000 Units Subcutaneous 3 times per day  . hydroxychloroquine  400 mg Oral Daily  . lipase/protease/amylase  12,000 Units Oral TID AC  . loratadine  10 mg Oral Daily  . mirtazapine  7.5 mg Oral QHS  . multivitamin with minerals  1 tablet Oral Daily  . pantoprazole  40 mg Oral Daily  . ramelteon  8 mg Oral QHS  . sodium chloride  3 mL Intravenous Q12H  . sodium chloride  3 mL Intravenous Q12H  . thiamine  100 mg Oral Daily  . vitamin B-12  250 mcg Oral QPM   Continuous  Infusions:  PRN Meds:.sodium chloride, acetaminophen **OR** acetaminophen, albuterol, camphor-menthol, LORazepam **OR** LORazepam, sodium chloride Assessment/Plan: Acute encephalopathy with ETOH abuse: [name] [name] reports no complaints this morning. Symptoms likely 2/2 acute ETOH intoxication. No withdrawal symptoms currently.  - Telemetry unremarkable - CIWA protocol  - Thiamine and Folate  - PT eval  Alcohol abuse: drinks gin 3 times a week. Has had seizure from EtOH withdrawal. CIWA protocol  Hyponatremia: Na 125 on admission. Improved to 130 following dialysis today.  Alcohol use in setting of ESRD.  -Appreciate Nephrology consult  Holosystolic murmur, hypoxia: New since 02/12/2015. Being worked up as outpt. PFTs consistent with an interstitial process such as fibrosis or interstitial inflammation, but anemia cannot be excluded as a potential cause of the diffusion defect. - Echo ordered.  Severe protein-calorie malnutrition ([place]) -Appreciate Nutrition consult  Lupus (systemic lupus erythematosus) ([place]): On Plaquenil 400 mg daily at home -Continue home medications  ESRD (end stage renal disease) on dialysis [facility]): HD MWF. HD today.  -Appreciate Renal Consult  DVT PPx: Heparin  CODE: FULL  Dispo: Disposition is deferred at this time, awaiting improvement of current medical problems.  Anticipated discharge in approximately 1 day(s).   The patient does have a current PCP Shela Leff, MD) and does need an [place]  follow-up appointment after discharge.  The patient does not have transportation limitations that hinder transportation to clinic appointments.  .Services Needed at time of discharge: Y = Yes, Blank = No PT:   OT:   RN:   Equipment:   Other:     LOS: 1 day   Maryellen Pile, MD IMTS PGY-1 [phone] 05/10/2015, 3:42 PM

## 2015-05-10 NOTE — Evaluation (Signed)
Physical Therapy Evaluation Patient Details Name: [name] [name] MRN: [id number] DOB: 12-16-1955 Today's Date: 05/10/2015   History of Present Illness  [name] [name] is a 59 year old female with a past medical history of ETOH abuse, HTN, ESRD (HD MWF), seizures, and CHF who presents to [facility] ED today after LOC.   Clinical Impression  Pt declined further ambulation than to bathroom. Pt functioning near baseline. Pt reports using cane sometimes but most of the time nothing. Pt mildly unsteady however ehr CBGs have been low today and she just returned from HD. Acute PT to con' t to follow to progress indep with mobility since daughter works during the day.    Follow Up Recommendations No PT follow up;Supervision/Assistance - 24 hour    Equipment Recommendations  None recommended by PT    Recommendations for Other Services       Precautions / Restrictions Precautions Precautions: Fall Restrictions Weight Bearing Restrictions: No      Mobility  Bed Mobility Overal bed mobility: Modified Independent             General bed mobility comments: HOB elevated, no physical assistance needed  Transfers Overall transfer level: Needs assistance   Transfers: Sit to/from Stand Sit to Stand: Supervision         General transfer comment: mildly unsteady but no overt loss of balance  Ambulation/Gait Ambulation/Gait assistance: Min guard Ambulation Distance (Feet): 15 Feet (x2, to/from bathroom) Assistive device: None Gait Pattern/deviations: WFL(Within Functional Limits) Gait velocity: mildly decreased Gait velocity interpretation: Below normal speed for age/gender General Gait Details: pt mildly unsteady but no overt LOB, guided pt with UE, pt reports using a cane sometimes but her house is so small she just uses the Forensic scientist    Modified Rankin (Stroke Patients Only)       Balance Overall balance assessment: No apparent balance  deficits (not formally assessed)                                           Pertinent Vitals/Pain Pain Assessment: No/denies pain    Home Living Family/patient expects to be discharged to:: Private residence Living Arrangements: Children;Spouse/significant other Available Help at Discharge: Available PRN/intermittently Type of Home: House Home Access: Stairs to enter Entrance Stairs-Rails: Can reach both Entrance Stairs-Number of Steps: 4 Home Layout: One level Home Equipment: Cane - single point;Grab bars - tub/shower;Shower seat      Prior Function Level of Independence: Independent         Comments: doesn't drive but will go with daughter to grocery store and run errhands     Hand Dominance   Dominant Hand: Right    Extremity/Trunk Assessment   Upper Extremity Assessment: Generalized weakness           Lower Extremity Assessment: Generalized weakness      Cervical / Trunk Assessment: Normal  Communication   Communication: No difficulties  Cognition Arousal/Alertness: Awake/alert Behavior During Therapy: WFL for tasks assessed/performed Overall Cognitive Status: Within Functional Limits for tasks assessed                      General Comments      Exercises        Assessment/Plan    PT Assessment Patient needs continued PT services  PT Diagnosis Generalized weakness   PT Problem List Decreased strength;Decreased activity tolerance;Decreased balance;Decreased mobility  PT Treatment Interventions DME instruction;Gait training;Stair training;Therapeutic activities;Functional mobility training;Therapeutic exercise;Balance training   PT Goals (Current goals can be found in the Care Plan section) Acute Rehab PT Goals Patient Stated Goal: to get back to bed because i'm freezing PT Goal Formulation: With patient Time For Goal Achievement: 05/17/15 Potential to Achieve Goals: Good Additional Goals Additional Goal #1: Pt to  score > 19 on DGI to indicate minimal falls risk.    Frequency Min 2X/week   Barriers to discharge        Co-evaluation               End of Session   Activity Tolerance: Patient limited by fatigue Patient left: in bed;with call bell/phone within reach;with bed alarm set Nurse Communication: Mobility status         Time: 1348-1401 PT Time Calculation (min) (ACUTE ONLY): 13 min   Charges:   PT Evaluation $Initial PT Evaluation Tier I: 1 Procedure     PT G CodesKingsley Callander 05/10/2015, 2:09 PM   Kittie Plater, PT, DPT Pager #: [phone] Office #: [phone]

## 2015-05-10 NOTE — Progress Notes (Signed)
Subjective:  No cos/tolerated HD last PM   Objective Vital signs in last 24 hours: Filed Vitals:   05/10/15 1014 05/10/15 1047 05/10/15 1115 05/10/15 1134  BP: 101/68 137/85 127/87 136/80  Pulse: 84 87 92 81  Temp:    97.9 F (36.6 C)  TempSrc:    Oral  Resp: 16 20 18 18   Height:      Weight:    36.6 kg (80 lb 11 oz)  SpO2:       Weight change:   Physical Exam: General:thin chronically ill  AAF NAD  OX3, [facility] Heart: RRR No mur , rub or gallop Lungs: CTA , nonlabored bereathing  Abdomen: soft, NT, ND Extremities: no pedal edema Dialysis Access: pos bruit  LUA AVG    OP HD=MWF [facility] 4h F160 37kg 4k/2.25 bath Heparin 2000 LUA AVG Calcitriol 1.0 ug Mircera 50 q 2wks last 12/2 Last Hb 10.4 tfs 44% Ca 7.9 P 3.8 pth 752   Problem/Plan: 1 LOC episode - WU per admit team = poss d/t etoh, or postictal, vs other 2 [facility]D= [facility] ([facility] k 5.2 Next HD wed 3 Etoh abuse= admit rx 4 Seizure d/o only on neurontin= admit rx 5 Hx lupus on plaquenil 6 Anemia= HGB 10.0 esa/ fe on hd / fu hgb  Next esa due 05/21/15 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  Phos 3.3   No current P binder / on  vit D 8 Psych on remeron/ prozac 9 Vol/HTN= am bp 127/87 and   uf 1099 last pm hd / wt post .4 below edw    Ernest Haber, PA-C [facility] Beeper [phone] 05/10/2015,12:30 PM  LOS: 1 day   Labs: Basic Metabolic Panel:  Recent Labs Lab 05/09/15 1246 05/10/15 0550  NA 125* 124*  K 4.6 5.2*  CL 92* 91*  CO2 24 23  GLUCOSE 104* 87  BUN 23* 29*  CREATININE 3.90* 4.02*  CALCIUM 8.0* 7.8*  PHOS  --  3.3   Liver Function Tests:  Recent Labs Lab 05/10/15 0550  ALBUMIN 2.3*     Recent Labs Lab 05/09/15 1246 05/10/15 0752  WBC 4.9 7.3  HGB 10.9* 10.0*  HCT 33.2* 29.8*  MCV 95.1 94.0  PLT 194 201  CBG:  Recent Labs Lab 05/09/15 1320 05/10/15 1217  GLUCAP 86 44*    Medications:   . feeding supplement (ENSURE ENLIVE)  237 mL Oral  BID BM  . FLUoxetine  10 mg Oral Daily  . folic acid  1 mg Oral Daily  . gabapentin  300 mg Oral Daily  . heparin  5,000 Units Subcutaneous 3 times per day  . hydroxychloroquine  400 mg Oral Daily  . lipase/protease/amylase  12,000 Units Oral TID AC  . loratadine  10 mg Oral Daily  . mirtazapine  7.5 mg Oral QHS  . multivitamin with minerals  1 tablet Oral Daily  . pantoprazole  40 mg Oral Daily  . ramelteon  8 mg Oral QHS  . sodium chloride  3 mL Intravenous Q12H  . sodium chloride  3 mL Intravenous Q12H  . thiamine  100 mg Oral Daily   Or  . thiamine  100 mg Intravenous Daily  . vitamin B-12  250 mcg Oral QPM

## 2015-05-10 NOTE — Progress Notes (Signed)
PT Cancellation Note  Patient Details Name: [name] [name] MRN: [id number] DOB: Dec 17, 1955   Cancelled Treatment:    Reason Eval/Treat Not Completed: Patient at procedure or test/unavailable. Pt in DIA.PT to return as able.   Kingsley Callander 05/10/2015, 7:25 AM   Kittie Plater, PT, DPT Pager #: [phone] Office #: [phone]

## 2015-05-10 NOTE — Progress Notes (Signed)
*  PRELIMINARY RESULTS* Echocardiogram 2D Echocardiogram has been performed.  [name] [name] 05/10/2015, 3:42 PM

## 2015-05-11 LAB — RENAL FUNCTION PANEL
ALBUMIN: 2.5 g/dL — AB (ref 3.5–5.0)
ANION GAP: 8 (ref 5–15)
BUN: 10 mg/dL (ref 6–20)
CALCIUM: 8.4 mg/dL — AB (ref 8.9–10.3)
CO2: 24 mmol/L (ref 22–32)
Chloride: 98 mmol/L — ABNORMAL LOW (ref 101–111)
Creatinine, Ser: 3.36 mg/dL — ABNORMAL HIGH (ref 0.44–1.00)
GFR calc Af Amer: 16 mL/min — ABNORMAL LOW (ref >60)
GFR, EST NON AFRICAN AMERICAN: 14 mL/min — AB (ref >60)
GLUCOSE: 139 mg/dL — AB (ref 65–99)
PHOSPHORUS: 2.9 mg/dL (ref 2.5–4.6)
Potassium: 3.8 mmol/L (ref 3.5–5.1)
SODIUM: 130 mmol/L — AB (ref 135–145)

## 2015-05-11 LAB — GLUCOSE, CAPILLARY
GLUCOSE-CAPILLARY: 98 mg/dL (ref 65–99)
Glucose-Capillary: 114 mg/dL — ABNORMAL HIGH (ref 65–99)
Glucose-Capillary: 130 mg/dL — ABNORMAL HIGH (ref 65–99)

## 2015-05-11 NOTE — Progress Notes (Signed)
Subjective: [name] again has no complaints this morning. Continues to deny any palpitations, chest pain, dizziness, shortness of breath. No further episodes of syncope.   Objective: Vital signs in last 24 hours: Filed Vitals:   05/10/15 1734 05/10/15 2100 05/11/15 0500 05/11/15 0900  BP: 99/64 105/66 111/72 112/68  Pulse: 108 105 96 98  Temp: 100 F (37.8 C) 99.5 F (37.5 C) 98.9 F (37.2 C) 98.4 F (36.9 C)  TempSrc: Oral Oral Oral Oral  Resp: 18 18 18 18   Height:      Weight:      SpO2: 98% 100% 100% 100%   Weight change: -3 lb 10.7 oz (-1.663 kg)  Intake/Output Summary (Last 24 hours) at 05/11/15 1513 Last data filed at 05/11/15 1100  Gross per 24 hour  Intake    120 ml  Output     75 ml  Net     45 ml    PHYSICAL EXAM GENERAL- alert, co-operative, malnourished appearing, not in any distress. HEENT- Atraumatic, normocephalic, oral mucosa appears moist CARDIAC- RRR, no murmurs, rubs or gallops. RESP- Moving equal volumes of air, and clear to auscultation bilaterally, no wheezes or crackles. ABDOMEN- Soft, nontender, bowel sounds present. NEURO- No obvious Cr N abnormality. EXTREMITIES- pulse 2+, symmetric, no pedal edema. SKIN- Warm, dry, No rash or lesion. PSYCH- Normal mood and affect, appropriate thought content and speech.  Medications: I have reviewed the patient's current medications. Scheduled Meds: . feeding supplement (ENSURE ENLIVE)  237 mL Oral BID BM  . FLUoxetine  10 mg Oral Daily  . folic acid  1 mg Oral Daily  . gabapentin  300 mg Oral Daily  . heparin  5,000 Units Subcutaneous 3 times per day  . hydroxychloroquine  400 mg Oral Daily  . lipase/protease/amylase  12,000 Units Oral TID AC  . loratadine  10 mg Oral Daily  . mirtazapine  7.5 mg Oral QHS  . multivitamin with minerals  1 tablet Oral Daily  . pantoprazole  40 mg Oral Daily  . ramelteon  8 mg Oral QHS  . sodium chloride  3 mL Intravenous Q12H  . sodium chloride  3 mL Intravenous  Q12H  . thiamine  100 mg Oral Daily  . vitamin B-12  250 mcg Oral QPM   Continuous Infusions:  PRN Meds:.sodium chloride, acetaminophen **OR** acetaminophen, albuterol, camphor-menthol, LORazepam **OR** LORazepam, sodium chloride Assessment/Plan: Acute encephalopathy with ETOH abuse: [name] [name] reports no complaints this morning. Symptoms likely 2/2 acute ETOH intoxication. No withdrawal symptoms currently.  - Telemetry unremarkable - CIWA protocol  - Thiamine and Folate  - PT eval - ETOH cessation counciling   Alcohol abuse: drinks gin 3 times a week. Has had seizure from EtOH withdrawal. CIWA protocol  Hyponatremia: Stable at 130. Received HD yesterday.  Alcohol use in setting of ESRD.  -Appreciate Nephrology consult  Holosystolic murmur, hypoxia: New since 02/12/2015. Being worked up as outpt. PFTs consistent with an interstitial process such as fibrosis or interstitial inflammation, but anemia cannot be excluded as a potential cause of the diffusion defect. - Echo: EF 55-60% with grade 1 diastolic dysfunction. Otherwise unremarkable. No valvular dysfunction noted.   Severe protein-calorie malnutrition ([place]) -Appreciate Nutrition consult  Lupus (systemic lupus erythematosus) ([place]): On Plaquenil 400 mg daily at home -Continue home medications  ESRD (end stage renal disease) on dialysis [facility]): HD MWF. HD today.  -Appreciate Renal Consult  DVT PPx: Heparin  CODE: FULL  Dispo: Disposition is deferred at this time, awaiting  improvement of current medical problems.  Anticipated discharge in approximately 1 day(s).   The patient does have a current PCP Shela Leff, MD) and does need an [facility] hospital follow-up appointment after discharge.  The patient does not have transportation limitations that hinder transportation to clinic appointments.  .Services Needed at time of discharge: Y = Yes, Blank = No PT:   OT:   RN:   Equipment:   Other:     LOS: 2 days   Maryellen Pile, MD IMTS PGY-1 [phone] 05/11/2015, 3:13 PM

## 2015-05-11 NOTE — Progress Notes (Signed)
Subjective:  No cos/ "I may be dc today"  Objective Vital signs in last 24 hours: Filed Vitals:   05/10/15 1134 05/10/15 1734 05/10/15 2100 05/11/15 0500  BP: 136/80 99/64 105/66 111/72  Pulse: 81 108 105 96  Temp: 97.9 F (36.6 C) 100 F (37.8 C) 99.5 F (37.5 C) 98.9 F (37.2 C)  TempSrc: Oral Oral Oral Oral  Resp: 18 18 18 18   Height:      Weight: 36.6 kg (80 lb 11 oz)     SpO2:  98% 100% 100%   Weight change: -1.663 kg (-3 lb 10.7 oz) Physical Exam: General:thin chronically ill AAF NAD OX3 Heart: RRR No mur , rub or gallop Lungs: CTA , nonlabored bereathing  Abdomen: soft, NT, ND Extremities: no pedal edema Dialysis Access: pos bruit LUA AVG   OP HD=MWF [facility] 4h F160 37kg 4k/2.25 bath Heparin 2000 LUA AVG Calcitriol 1.0 ug Mircera 50 q 2wks last 12/2 Last Hb 10.4 tfs 44% Ca 7.9 P 3.8 pth 752   Problem/Plan: 1 LOC episode - WU per admit team = poss d/t etoh, or postictal, vs other 2 [facility]D= [facility] ([facility] k 3.8 Next HD wed ?stble to do at op unit  With [employer] admit team  Plans today  3 Etoh abuse= admit rx 4 Seizure d/o only on neurontin= admit rx 5 Hx lupus on plaquenil 6 Anemia= HGB 10.0 esa/ fe on hd / fu hgb Next esa due 05/21/15 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 Phos2.9 No current P binder / on vit D 8 Psych on remeron/ prozac 9 Vol/HTN= am bp 111/72  / wt post .4 below edw    Ernest Haber, PA-C [facility] Beeper [phone] 05/11/2015,9:50 AM  LOS: 2 days   Labs: Basic Metabolic Panel:  Recent Labs Lab 05/10/15 0550 05/10/15 1400 05/11/15 0855  NA 124* 130* 130*  K 5.2* 4.1 3.8  CL 91* 95* 98*  CO2 23 22 24   GLUCOSE 87 151* 139*  BUN 29* 7 10  CREATININE 4.02* 1.86* 3.36*  CALCIUM 7.8* 8.3* 8.4*  PHOS 3.3  --  2.9   Liver Function Tests:  Recent Labs Lab 05/10/15 0550 05/11/15 0855  ALBUMIN 2.3* 2.5*   No results for input(s): LIPASE, AMYLASE in the last 168 hours. No results for input(s): AMMONIA in the  last 168 hours. CBC:  Recent Labs Lab 05/09/15 1246 05/10/15 0752  WBC 4.9 7.3  HGB 10.9* 10.0*  HCT 33.2* 29.8*  MCV 95.1 94.0  PLT 194 201   Cardiac Enzymes: No results for input(s): CKTOTAL, CKMB, CKMBINDEX, TROPONINI in the last 168 hours. CBG:  Recent Labs Lab 05/10/15 1247 05/10/15 1309 05/10/15 1733 05/10/15 2158 05/11/15 0810  GLUCAP 40* 143* 121* 109* 98    Studies/Results: Ct Head Wo Contrast  05/09/2015  CLINICAL DATA:  Unwitnessed fall with abrasion to the right face. EXAM: CT HEAD WITHOUT CONTRAST TECHNIQUE: Contiguous axial images were obtained from the base of the skull through the vertex without intravenous contrast. COMPARISON:  04/21/2015 FINDINGS: Brain: No evidence of acute infarction, hemorrhage, extra-axial collection, ventriculomegaly, or mass effect. There is mild brain parenchymal atrophy. Vascular: No hyperdense vessel or unexpected calcification. Skull: Negative for fracture or focal lesion. Sinuses/Orbits: There is right inferior within the left sphenoid sinus, paranasal sinuses and mastoid air cells are otherwise clear. Other: None. IMPRESSION: No acute intracranial abnormality. Mild brain parenchymal atrophy. Left sphenoid sinusitis. Electronically Signed   By: Linwood Dibbles.D.  On: 05/09/2015 12:57   Medications:   . feeding supplement (ENSURE ENLIVE)  237 mL Oral BID BM  . FLUoxetine  10 mg Oral Daily  . folic acid  1 mg Oral Daily  . gabapentin  300 mg Oral Daily  . heparin  5,000 Units Subcutaneous 3 times per day  . hydroxychloroquine  400 mg Oral Daily  . lipase/protease/amylase  12,000 Units Oral TID AC  . loratadine  10 mg Oral Daily  . mirtazapine  7.5 mg Oral QHS  . multivitamin with minerals  1 tablet Oral Daily  . pantoprazole  40 mg Oral Daily  . ramelteon  8 mg Oral QHS  . sodium chloride  3 mL Intravenous Q12H  . sodium chloride  3 mL Intravenous Q12H  . thiamine  100 mg Oral Daily  . vitamin B-12  250 mcg Oral QPM

## 2015-05-11 NOTE — Discharge Instructions (Signed)
[name] [name] you have a follow up appointment in the clinic downstairs next week on Tuesday 12/13 at 10:15 AM with Dr. Benjamine Mola. Please continue to work on cutting back on the alcohol and try to stop completely.

## 2015-05-11 NOTE — Discharge Summary (Signed)
Name: [name] [name] MRN: [id number] DOB: 1956-01-25 59 y.o. PCP: Shela Leff, MD  Date of Admission: 05/09/2015 11:25 AM Date of Discharge: 05/11/2015 Attending Physician: Aldine Contes, MD  Discharge Diagnosis: Principal Problem:   Acute encephalopathy Active Problems:   Alcohol abuse   Seizure disorder ([place])   Severe protein-calorie malnutrition (HCC)   Systolic and diastolic CHF, chronic (HCC)   Lupus (systemic lupus erythematosus) (HCC)   Hyponatremia   ESRD (end stage renal disease) on dialysis [facility])  Discharge Medications:   Medication List    TAKE these medications        ACIDOPHILUS PROBIOTIC PO  Take 2 mg by mouth 3 (three) times daily. Take 2 (two) tablets by mouth three times daily     albuterol 108 (90 BASE) MCG/ACT inhaler  Commonly known as:  PROAIR HFA  Inhale 1-2 puffs into the lungs every 6 (six) hours as needed for wheezing or shortness of breath.     calcium carbonate 500 MG chewable tablet  Commonly known as:  TUMS - dosed in mg elemental calcium  Chew 1,000 tablets by mouth at bedtime.     camphor-menthol lotion  Commonly known as:  SARNA  Apply 1 application topically daily as needed for itching.     cetirizine 10 MG tablet  Commonly known as:  ZYRTEC  Take 1 tablet (10 mg total) by mouth daily as needed for allergies.     CREON 12000 UNITS Cpep capsule  Generic drug:  lipase/protease/amylase  Take 1 capsule (12,000 Units total) by mouth 3 (three) times daily before meals.     diclofenac sodium 1 % Gel  Commonly known as:  VOLTAREN  Apply 4 g topically daily.     ENSURE  Take 237 mLs by mouth 2 (two) times daily between meals.     FLUoxetine 10 MG capsule  Commonly known as:  PROZAC  Take 1 capsule (10 mg total) by mouth daily. For depression     folic acid 1 MG tablet  Commonly known as:  FOLVITE  Take 1 tablet (1 mg total) by mouth daily. For folic acid replacement     gabapentin 300 MG capsule  Commonly known as:   NEURONTIN  Take 1 capsule (300 mg total) by mouth daily.     hydroxychloroquine 200 MG tablet  Commonly known as:  PLAQUENIL  Take 400 mg by mouth daily.     loperamide 2 MG capsule  Commonly known as:  IMODIUM  Take 2 mg by mouth every 6 (six) hours as needed for diarrhea or loose stools.     mirtazapine 7.5 MG tablet  Commonly known as:  REMERON  Take 1 tablet (7.5 mg total) by mouth at bedtime.     multivitamin with minerals Tabs tablet  Take 1 tablet by mouth daily. For vitamin replacement     omeprazole 40 MG capsule  Commonly known as:  PRILOSEC  Take 1 capsule (40 mg total) by mouth daily.     ramelteon 8 MG tablet  Commonly known as:  ROZEREM  Take 1 tablet (8 mg total) by mouth at bedtime.     thiamine 100 MG tablet  Commonly known as:  VITAMIN B-1  Take 1 tablet (100 mg total) by mouth daily. For low thiamine     vitamin B-12 250 MCG tablet  Commonly known as:  CYANOCOBALAMIN  Take 1 tablet (250 mcg total) by mouth every evening.        Disposition and follow-up:  [name] [name] was discharged from [facility] in Stable condition.  At the hospital follow up visit please address:  1. Acute Encephalopathy 2/2 ETOH abuse. Please assess for any further episodes of syncope or AMS and continue to encourage ETOH cessation   2.  Labs / imaging needed at time of follow-up: None  3.  Pending labs/ test needing follow-up: None  Follow-up Appointments: Follow-up Information    Follow up with Hinton Lovely, MD On 05/18/2015.   Specialty:  Internal Medicine   Why:  10:15 AM   Contact information:   [place] [phone]       Discharge Instructions: Discharge Instructions    Diet - low sodium heart healthy    Complete by:  As directed      Increase activity slowly    Complete by:  As directed            Consultations: Treatment Team:  Roney Jaffe, MD  Procedures Performed:  Ct Head Wo  Contrast  05/09/2015  CLINICAL DATA:  Unwitnessed fall with abrasion to the right face. EXAM: CT HEAD WITHOUT CONTRAST TECHNIQUE: Contiguous axial images were obtained from the base of the skull through the vertex without intravenous contrast. COMPARISON:  04/21/2015 FINDINGS: Brain: No evidence of acute infarction, hemorrhage, extra-axial collection, ventriculomegaly, or mass effect. There is mild brain parenchymal atrophy. Vascular: No hyperdense vessel or unexpected calcification. Skull: Negative for fracture or focal lesion. Sinuses/Orbits: There is right inferior within the left sphenoid sinus, paranasal sinuses and mastoid air cells are otherwise clear. Other: None. IMPRESSION: No acute intracranial abnormality. Mild brain parenchymal atrophy. Left sphenoid sinusitis. Electronically Signed   By: Fidela Salisbury M.D.   On: 05/09/2015 12:57   Ct Head Wo Contrast  04/21/2015  CLINICAL DATA:  Code stroke, slurred speech, bilateral leg weakness. Hemodialysis patient. EXAM: CT HEAD WITHOUT CONTRAST TECHNIQUE: Contiguous axial images were obtained from the base of the skull through the vertex without intravenous contrast. COMPARISON:  Head CT 01/15/2013 FINDINGS: No acute intracranial hemorrhage. No focal mass lesion. No CT evidence of acute infarction. No midline shift or mass effect. No hydrocephalus. Basilar cisterns are patent. There is generalized cortical atrophy and mild proportional ventricular dilatation. Paranasal sinuses and  mastoid air cells are clear. IMPRESSION: No CT evidence of acute infarction or intracranial hemorrhage. Findings conveyed toDr. Eugenie Norrie 04/21/2015  at12:07. Electronically Signed   By: Suzy Bouchard M.D.   On: 04/21/2015 12:09   2D ECHO: Study Conclusions - Left ventricle: The cavity size was normal. Wall thickness was normal. Systolic function was normal. The estimated ejection fraction was in the range of 55% to 60%. Wall motion was normal; there were no  regional wall motion abnormalities. Doppler parameters are consistent with abnormal left ventricular relaxation (grade 1 diastolic dysfunction). - Aortic valve: There was no stenosis. - Mitral valve: Mildly calcified annulus. Mildly calcified leaflets . There was no significant regurgitation. - Tricuspid valve: Peak RV-RA gradient (S): 21 mm Hg. - Pulmonary arteries: PA peak pressure: 24 mm Hg (S). - Inferior vena cava: The vessel was normal in size. The respirophasic diameter changes were in the normal range (>= 50%), consistent with normal central venous pressure.  Impressions: - Normal LV size with EF 55-60%. Normal RV size and systolic function. No significant valvular abnormalities.  Admission HPI: [name] [name] is a 59 year old female with a past medical history of ETOH abuse, HTN, ESRD (HD MWF), seizures, and CHF  who presents to [facility] ED today after LOC. [name] [name] does not recall the event. She reports that she felt like her normal self this morning and was walking from the kitchen to her bedroom and does not remember anything after that until she was being seen by EMS. She was found by her daughter on the ground unresponsive. She was unconscious for an unknown length of time. She was AAOx4 with no complaints on EMS arrival. She reports she had no eaten this morning. She tells me that she had 3 shots of gin during the day yesterday with no ETOH since. Reported to the ED she had 2 cups of gin this morning. ETOH level was 412 in the ED. She reports drinking 3 shots of gin a week. She denies any symptoms prior to LOC. No dizziness, vision changes, CP, palpations. Denies any loss of bowel or bladder function or biting her tongue and no seizure activity witnessed. Does report some palpations after the episode en route to the hospital that has resolved. Denies any history of palpitations.  Hospital Course by problem list: Principal Problem:   Acute encephalopathy Active Problems:    Alcohol abuse   Seizure disorder (HCC)   Severe protein-calorie malnutrition (HCC)   Systolic and diastolic CHF, chronic (HCC)   Lupus (systemic lupus erythematosus) (HCC)   Hyponatremia   ESRD (end stage renal disease) on dialysis ([place])    Acute encephalopathy with ETOH abuse [name] [name] was found unconscious and responsive. Denied any symptoms prior to episode and denies any seizure activity with no loss of bowel or bladder function and no tongue biting. She reports feeling her normal self before and after the event. She denies any ETOH use at the day of the event to me but did report ETOH to the ED providers. ETOH level was 412 in the ED. She does have a history of seizures on no medications. EKG with PR 213 otherwise unchagned from previous. CT head was unremarkable.She was placed on CIWA protocol and thiamine and folate replaced. Likely 2/2 ETOH intoxication. She had no signs of ETOH withdrawal during hospitalization. Counciled on ETOH cessation and discharged in stable condition.   Alcohol abuse: Reports drinking gin 3 times a week. Has had seizures from EtOH withdrawal in past.   Hyponatremia: Na 125 on admission. Received regular HD session with improvement in Na to 130.  Holosystolic murmur, hypoxia: New since 02/12/2015. Being worked up as outpt. PFTs consistent with an interstitial process such as fibrosis or interstitial inflammation, but anemia cannot be excluded as a potential cause of the diffusion defect. ECHO with EF 55-60% with grade 1 diastolic dysfunction. Otherwise unremarkable. No valvular dysfunction noted.    Lupus (systemic lupus erythematosus) ([place]): On Plaquenil 400 mg daily at home. Continued home medications  ESRD (end stage renal disease) on dialysis [facility]): HD MWF  Discharge Vitals:   BP 114/73 mmHg  Pulse 82  Temp(Src) 97.4 F (36.3 C) (Oral)  Resp 18  Ht 5\' 1"  (1.549 m)  Wt 80 lb 11 oz (36.6 kg)  BMI 15.25 kg/m2  SpO2 100%  Discharge Labs:  No results  found for this or any previous visit (from the past 24 hour(s)).  Signed: Maryellen Pile, MD 05/16/2015, 7:12 PM

## 2015-05-12 DIAGNOSIS — E876 Hypokalemia: Secondary | ICD-10-CM | POA: Diagnosis not present

## 2015-05-12 DIAGNOSIS — D631 Anemia in chronic kidney disease: Secondary | ICD-10-CM | POA: Diagnosis not present

## 2015-05-12 DIAGNOSIS — D688 Other specified coagulation defects: Secondary | ICD-10-CM | POA: Diagnosis not present

## 2015-05-12 DIAGNOSIS — N186 End stage renal disease: Secondary | ICD-10-CM | POA: Diagnosis not present

## 2015-05-14 DIAGNOSIS — N186 End stage renal disease: Secondary | ICD-10-CM | POA: Diagnosis not present

## 2015-05-14 DIAGNOSIS — D631 Anemia in chronic kidney disease: Secondary | ICD-10-CM | POA: Diagnosis not present

## 2015-05-14 DIAGNOSIS — D688 Other specified coagulation defects: Secondary | ICD-10-CM | POA: Diagnosis not present

## 2015-05-14 DIAGNOSIS — E876 Hypokalemia: Secondary | ICD-10-CM | POA: Diagnosis not present

## 2015-05-17 DIAGNOSIS — E876 Hypokalemia: Secondary | ICD-10-CM | POA: Diagnosis not present

## 2015-05-17 DIAGNOSIS — D631 Anemia in chronic kidney disease: Secondary | ICD-10-CM | POA: Diagnosis not present

## 2015-05-17 DIAGNOSIS — D688 Other specified coagulation defects: Secondary | ICD-10-CM | POA: Diagnosis not present

## 2015-05-17 DIAGNOSIS — N186 End stage renal disease: Secondary | ICD-10-CM | POA: Diagnosis not present

## 2015-05-18 ENCOUNTER — Ambulatory Visit: Payer: Self-pay | Admitting: Internal Medicine

## 2015-05-19 DIAGNOSIS — D688 Other specified coagulation defects: Secondary | ICD-10-CM | POA: Diagnosis not present

## 2015-05-19 DIAGNOSIS — D631 Anemia in chronic kidney disease: Secondary | ICD-10-CM | POA: Diagnosis not present

## 2015-05-19 DIAGNOSIS — E876 Hypokalemia: Secondary | ICD-10-CM | POA: Diagnosis not present

## 2015-05-19 DIAGNOSIS — N186 End stage renal disease: Secondary | ICD-10-CM | POA: Diagnosis not present

## 2015-05-20 ENCOUNTER — Ambulatory Visit: Payer: Self-pay | Admitting: Internal Medicine

## 2015-05-20 ENCOUNTER — Encounter: Payer: Self-pay | Admitting: Internal Medicine

## 2015-05-21 DIAGNOSIS — D688 Other specified coagulation defects: Secondary | ICD-10-CM | POA: Diagnosis not present

## 2015-05-21 DIAGNOSIS — N186 End stage renal disease: Secondary | ICD-10-CM | POA: Diagnosis not present

## 2015-05-21 DIAGNOSIS — D631 Anemia in chronic kidney disease: Secondary | ICD-10-CM | POA: Diagnosis not present

## 2015-05-21 DIAGNOSIS — E876 Hypokalemia: Secondary | ICD-10-CM | POA: Diagnosis not present

## 2015-05-24 DIAGNOSIS — E876 Hypokalemia: Secondary | ICD-10-CM | POA: Diagnosis not present

## 2015-05-24 DIAGNOSIS — D688 Other specified coagulation defects: Secondary | ICD-10-CM | POA: Diagnosis not present

## 2015-05-24 DIAGNOSIS — N186 End stage renal disease: Secondary | ICD-10-CM | POA: Diagnosis not present

## 2015-05-24 DIAGNOSIS — D631 Anemia in chronic kidney disease: Secondary | ICD-10-CM | POA: Diagnosis not present

## 2015-05-25 DIAGNOSIS — J9620 Acute and chronic respiratory failure, unspecified whether with hypoxia or hypercapnia: Secondary | ICD-10-CM | POA: Diagnosis not present

## 2015-05-28 DIAGNOSIS — D631 Anemia in chronic kidney disease: Secondary | ICD-10-CM | POA: Diagnosis not present

## 2015-05-28 DIAGNOSIS — N186 End stage renal disease: Secondary | ICD-10-CM | POA: Diagnosis not present

## 2015-05-28 DIAGNOSIS — E876 Hypokalemia: Secondary | ICD-10-CM | POA: Diagnosis not present

## 2015-05-28 DIAGNOSIS — D688 Other specified coagulation defects: Secondary | ICD-10-CM | POA: Diagnosis not present

## 2015-05-31 DIAGNOSIS — D631 Anemia in chronic kidney disease: Secondary | ICD-10-CM | POA: Diagnosis not present

## 2015-05-31 DIAGNOSIS — N186 End stage renal disease: Secondary | ICD-10-CM | POA: Diagnosis not present

## 2015-05-31 DIAGNOSIS — D688 Other specified coagulation defects: Secondary | ICD-10-CM | POA: Diagnosis not present

## 2015-05-31 DIAGNOSIS — E876 Hypokalemia: Secondary | ICD-10-CM | POA: Diagnosis not present

## 2015-06-01 DIAGNOSIS — M329 Systemic lupus erythematosus, unspecified: Secondary | ICD-10-CM | POA: Diagnosis not present

## 2015-06-01 DIAGNOSIS — N186 End stage renal disease: Secondary | ICD-10-CM | POA: Diagnosis not present

## 2015-06-02 DIAGNOSIS — N186 End stage renal disease: Secondary | ICD-10-CM | POA: Diagnosis not present

## 2015-06-02 DIAGNOSIS — D688 Other specified coagulation defects: Secondary | ICD-10-CM | POA: Diagnosis not present

## 2015-06-02 DIAGNOSIS — D631 Anemia in chronic kidney disease: Secondary | ICD-10-CM | POA: Diagnosis not present

## 2015-06-02 DIAGNOSIS — E876 Hypokalemia: Secondary | ICD-10-CM | POA: Diagnosis not present

## 2015-06-05 DIAGNOSIS — D688 Other specified coagulation defects: Secondary | ICD-10-CM | POA: Diagnosis not present

## 2015-06-05 DIAGNOSIS — N031 Chronic nephritic syndrome with focal and segmental glomerular lesions: Secondary | ICD-10-CM | POA: Diagnosis not present

## 2015-06-05 DIAGNOSIS — D631 Anemia in chronic kidney disease: Secondary | ICD-10-CM | POA: Diagnosis not present

## 2015-06-05 DIAGNOSIS — N186 End stage renal disease: Secondary | ICD-10-CM | POA: Diagnosis not present

## 2015-06-05 DIAGNOSIS — Z992 Dependence on renal dialysis: Secondary | ICD-10-CM | POA: Diagnosis not present

## 2015-06-05 DIAGNOSIS — E876 Hypokalemia: Secondary | ICD-10-CM | POA: Diagnosis not present

## 2015-06-07 DIAGNOSIS — D688 Other specified coagulation defects: Secondary | ICD-10-CM | POA: Diagnosis not present

## 2015-06-07 DIAGNOSIS — E876 Hypokalemia: Secondary | ICD-10-CM | POA: Diagnosis not present

## 2015-06-07 DIAGNOSIS — D631 Anemia in chronic kidney disease: Secondary | ICD-10-CM | POA: Diagnosis not present

## 2015-06-07 DIAGNOSIS — R52 Pain, unspecified: Secondary | ICD-10-CM | POA: Diagnosis not present

## 2015-06-07 DIAGNOSIS — N186 End stage renal disease: Secondary | ICD-10-CM | POA: Diagnosis not present

## 2015-06-08 ENCOUNTER — Other Ambulatory Visit: Payer: Self-pay | Admitting: *Deleted

## 2015-06-08 MED ORDER — FOLIC ACID 1 MG PO TABS: 1.0000 mg | ORAL_TABLET | Freq: Every day | ORAL | Status: DC

## 2015-06-09 DIAGNOSIS — E876 Hypokalemia: Secondary | ICD-10-CM | POA: Diagnosis not present

## 2015-06-09 DIAGNOSIS — R52 Pain, unspecified: Secondary | ICD-10-CM | POA: Diagnosis not present

## 2015-06-09 DIAGNOSIS — D631 Anemia in chronic kidney disease: Secondary | ICD-10-CM | POA: Diagnosis not present

## 2015-06-09 DIAGNOSIS — N186 End stage renal disease: Secondary | ICD-10-CM | POA: Diagnosis not present

## 2015-06-09 DIAGNOSIS — D688 Other specified coagulation defects: Secondary | ICD-10-CM | POA: Diagnosis not present

## 2015-06-10 ENCOUNTER — Other Ambulatory Visit: Payer: Self-pay | Admitting: *Deleted

## 2015-06-11 DIAGNOSIS — E876 Hypokalemia: Secondary | ICD-10-CM | POA: Diagnosis not present

## 2015-06-11 DIAGNOSIS — R52 Pain, unspecified: Secondary | ICD-10-CM | POA: Diagnosis not present

## 2015-06-11 DIAGNOSIS — N186 End stage renal disease: Secondary | ICD-10-CM | POA: Diagnosis not present

## 2015-06-11 DIAGNOSIS — D631 Anemia in chronic kidney disease: Secondary | ICD-10-CM | POA: Diagnosis not present

## 2015-06-11 DIAGNOSIS — D688 Other specified coagulation defects: Secondary | ICD-10-CM | POA: Diagnosis not present

## 2015-06-11 MED ORDER — CETIRIZINE HCL 10 MG PO TABS: 10.0000 mg | ORAL_TABLET | Freq: Every day | ORAL | Status: DC | PRN

## 2015-06-15 DIAGNOSIS — N186 End stage renal disease: Secondary | ICD-10-CM | POA: Diagnosis not present

## 2015-06-15 DIAGNOSIS — R52 Pain, unspecified: Secondary | ICD-10-CM | POA: Diagnosis not present

## 2015-06-15 DIAGNOSIS — D688 Other specified coagulation defects: Secondary | ICD-10-CM | POA: Diagnosis not present

## 2015-06-15 DIAGNOSIS — D631 Anemia in chronic kidney disease: Secondary | ICD-10-CM | POA: Diagnosis not present

## 2015-06-15 DIAGNOSIS — E876 Hypokalemia: Secondary | ICD-10-CM | POA: Diagnosis not present

## 2015-06-16 DIAGNOSIS — D688 Other specified coagulation defects: Secondary | ICD-10-CM | POA: Diagnosis not present

## 2015-06-16 DIAGNOSIS — N186 End stage renal disease: Secondary | ICD-10-CM | POA: Diagnosis not present

## 2015-06-16 DIAGNOSIS — E876 Hypokalemia: Secondary | ICD-10-CM | POA: Diagnosis not present

## 2015-06-16 DIAGNOSIS — D631 Anemia in chronic kidney disease: Secondary | ICD-10-CM | POA: Diagnosis not present

## 2015-06-16 DIAGNOSIS — R52 Pain, unspecified: Secondary | ICD-10-CM | POA: Diagnosis not present

## 2015-06-18 DIAGNOSIS — R52 Pain, unspecified: Secondary | ICD-10-CM | POA: Diagnosis not present

## 2015-06-18 DIAGNOSIS — D688 Other specified coagulation defects: Secondary | ICD-10-CM | POA: Diagnosis not present

## 2015-06-18 DIAGNOSIS — E876 Hypokalemia: Secondary | ICD-10-CM | POA: Diagnosis not present

## 2015-06-18 DIAGNOSIS — N186 End stage renal disease: Secondary | ICD-10-CM | POA: Diagnosis not present

## 2015-06-18 DIAGNOSIS — D631 Anemia in chronic kidney disease: Secondary | ICD-10-CM | POA: Diagnosis not present

## 2015-06-23 DIAGNOSIS — D631 Anemia in chronic kidney disease: Secondary | ICD-10-CM | POA: Diagnosis not present

## 2015-06-23 DIAGNOSIS — D688 Other specified coagulation defects: Secondary | ICD-10-CM | POA: Diagnosis not present

## 2015-06-23 DIAGNOSIS — R52 Pain, unspecified: Secondary | ICD-10-CM | POA: Diagnosis not present

## 2015-06-23 DIAGNOSIS — E876 Hypokalemia: Secondary | ICD-10-CM | POA: Diagnosis not present

## 2015-06-23 DIAGNOSIS — N186 End stage renal disease: Secondary | ICD-10-CM | POA: Diagnosis not present

## 2015-06-25 DIAGNOSIS — E876 Hypokalemia: Secondary | ICD-10-CM | POA: Diagnosis not present

## 2015-06-25 DIAGNOSIS — J989 Respiratory disorder, unspecified: Secondary | ICD-10-CM | POA: Diagnosis not present

## 2015-06-25 DIAGNOSIS — R52 Pain, unspecified: Secondary | ICD-10-CM | POA: Diagnosis not present

## 2015-06-25 DIAGNOSIS — N186 End stage renal disease: Secondary | ICD-10-CM | POA: Diagnosis not present

## 2015-06-25 DIAGNOSIS — D688 Other specified coagulation defects: Secondary | ICD-10-CM | POA: Diagnosis not present

## 2015-06-25 DIAGNOSIS — D631 Anemia in chronic kidney disease: Secondary | ICD-10-CM | POA: Diagnosis not present

## 2015-06-25 DIAGNOSIS — J9620 Acute and chronic respiratory failure, unspecified whether with hypoxia or hypercapnia: Secondary | ICD-10-CM | POA: Diagnosis not present

## 2015-06-28 DIAGNOSIS — E876 Hypokalemia: Secondary | ICD-10-CM | POA: Diagnosis not present

## 2015-06-28 DIAGNOSIS — N186 End stage renal disease: Secondary | ICD-10-CM | POA: Diagnosis not present

## 2015-06-28 DIAGNOSIS — D688 Other specified coagulation defects: Secondary | ICD-10-CM | POA: Diagnosis not present

## 2015-06-28 DIAGNOSIS — R52 Pain, unspecified: Secondary | ICD-10-CM | POA: Diagnosis not present

## 2015-06-28 DIAGNOSIS — D631 Anemia in chronic kidney disease: Secondary | ICD-10-CM | POA: Diagnosis not present

## 2015-06-30 ENCOUNTER — Encounter (HOSPITAL_COMMUNITY): Payer: Self-pay | Admitting: Emergency Medicine

## 2015-06-30 ENCOUNTER — Inpatient Hospital Stay (HOSPITAL_COMMUNITY)
Admission: EM | Admit: 2015-06-30 | Discharge: 2015-07-05 | DRG: 864 | Disposition: A | Discharge status: Home / Self Care | Payer: Medicare Other | Attending: Internal Medicine | Admitting: Internal Medicine

## 2015-06-30 ENCOUNTER — Emergency Department (HOSPITAL_COMMUNITY): Payer: Medicare Other

## 2015-06-30 DIAGNOSIS — E781 Pure hyperglyceridemia: Secondary | ICD-10-CM | POA: Diagnosis present

## 2015-06-30 DIAGNOSIS — F32A Depression, unspecified: Secondary | ICD-10-CM | POA: Diagnosis present

## 2015-06-30 DIAGNOSIS — G4700 Insomnia, unspecified: Secondary | ICD-10-CM

## 2015-06-30 DIAGNOSIS — Z8249 Family history of ischemic heart disease and other diseases of the circulatory system: Secondary | ICD-10-CM | POA: Diagnosis not present

## 2015-06-30 DIAGNOSIS — M6281 Muscle weakness (generalized): Secondary | ICD-10-CM | POA: Diagnosis not present

## 2015-06-30 DIAGNOSIS — E46 Unspecified protein-calorie malnutrition: Secondary | ICD-10-CM | POA: Diagnosis not present

## 2015-06-30 DIAGNOSIS — Z9981 Dependence on supplemental oxygen: Secondary | ICD-10-CM

## 2015-06-30 DIAGNOSIS — F329 Major depressive disorder, single episode, unspecified: Secondary | ICD-10-CM | POA: Diagnosis present

## 2015-06-30 DIAGNOSIS — D72819 Decreased white blood cell count, unspecified: Secondary | ICD-10-CM | POA: Diagnosis present

## 2015-06-30 DIAGNOSIS — A419 Sepsis, unspecified organism: Secondary | ICD-10-CM | POA: Diagnosis not present

## 2015-06-30 DIAGNOSIS — E872 Acidosis, unspecified: Secondary | ICD-10-CM

## 2015-06-30 DIAGNOSIS — Z599 Problem related to housing and economic circumstances, unspecified: Secondary | ICD-10-CM

## 2015-06-30 DIAGNOSIS — M3214 Glomerular disease in systemic lupus erythematosus: Secondary | ICD-10-CM

## 2015-06-30 DIAGNOSIS — Z992 Dependence on renal dialysis: Secondary | ICD-10-CM | POA: Diagnosis not present

## 2015-06-30 DIAGNOSIS — R6510 Systemic inflammatory response syndrome (SIRS) of non-infectious origin without acute organ dysfunction: Secondary | ICD-10-CM | POA: Diagnosis present

## 2015-06-30 DIAGNOSIS — R1110 Vomiting, unspecified: Secondary | ICD-10-CM | POA: Diagnosis not present

## 2015-06-30 DIAGNOSIS — N269 Renal sclerosis, unspecified: Secondary | ICD-10-CM | POA: Diagnosis not present

## 2015-06-30 DIAGNOSIS — I959 Hypotension, unspecified: Secondary | ICD-10-CM | POA: Diagnosis present

## 2015-06-30 DIAGNOSIS — R509 Fever, unspecified: Principal | ICD-10-CM | POA: Diagnosis present

## 2015-06-30 DIAGNOSIS — K219 Gastro-esophageal reflux disease without esophagitis: Secondary | ICD-10-CM | POA: Diagnosis not present

## 2015-06-30 DIAGNOSIS — I120 Hypertensive chronic kidney disease with stage 5 chronic kidney disease or end stage renal disease: Secondary | ICD-10-CM | POA: Diagnosis not present

## 2015-06-30 DIAGNOSIS — Z681 Body mass index (BMI) 19 or less, adult: Secondary | ICD-10-CM

## 2015-06-30 DIAGNOSIS — Z823 Family history of stroke: Secondary | ICD-10-CM | POA: Diagnosis not present

## 2015-06-30 DIAGNOSIS — R634 Abnormal weight loss: Secondary | ICD-10-CM

## 2015-06-30 DIAGNOSIS — I5032 Chronic diastolic (congestive) heart failure: Secondary | ICD-10-CM | POA: Diagnosis not present

## 2015-06-30 DIAGNOSIS — D638 Anemia in other chronic diseases classified elsewhere: Secondary | ICD-10-CM | POA: Diagnosis present

## 2015-06-30 DIAGNOSIS — M329 Systemic lupus erythematosus, unspecified: Secondary | ICD-10-CM | POA: Diagnosis not present

## 2015-06-30 DIAGNOSIS — N186 End stage renal disease: Secondary | ICD-10-CM | POA: Diagnosis not present

## 2015-06-30 DIAGNOSIS — F1010 Alcohol abuse, uncomplicated: Secondary | ICD-10-CM

## 2015-06-30 DIAGNOSIS — D631 Anemia in chronic kidney disease: Secondary | ICD-10-CM | POA: Diagnosis not present

## 2015-06-30 DIAGNOSIS — Z800 Family history of malignant neoplasm of digestive organs: Secondary | ICD-10-CM

## 2015-06-30 DIAGNOSIS — E43 Unspecified severe protein-calorie malnutrition: Secondary | ICD-10-CM | POA: Diagnosis not present

## 2015-06-30 DIAGNOSIS — R740 Nonspecific elevation of levels of transaminase and lactic acid dehydrogenase [LDH]: Secondary | ICD-10-CM | POA: Diagnosis not present

## 2015-06-30 DIAGNOSIS — I132 Hypertensive heart and chronic kidney disease with heart failure and with stage 5 chronic kidney disease, or end stage renal disease: Secondary | ICD-10-CM | POA: Diagnosis not present

## 2015-06-30 DIAGNOSIS — Z9114 Patient's other noncompliance with medication regimen: Secondary | ICD-10-CM | POA: Diagnosis not present

## 2015-06-30 DIAGNOSIS — Z87891 Personal history of nicotine dependence: Secondary | ICD-10-CM | POA: Diagnosis not present

## 2015-06-30 DIAGNOSIS — I129 Hypertensive chronic kidney disease with stage 1 through stage 4 chronic kidney disease, or unspecified chronic kidney disease: Secondary | ICD-10-CM | POA: Diagnosis present

## 2015-06-30 LAB — CBC WITH DIFFERENTIAL/PLATELET
BASOS ABS: 0 10*3/uL (ref 0.0–0.1)
BASOS PCT: 0 %
Eosinophils Absolute: 0 10*3/uL (ref 0.0–0.7)
Eosinophils Relative: 0 %
HEMATOCRIT: 37.8 % (ref 36.0–46.0)
HEMOGLOBIN: 12.4 g/dL (ref 12.0–15.0)
LYMPHS PCT: 9 %
Lymphs Abs: 0.3 10*3/uL — ABNORMAL LOW (ref 0.7–4.0)
MCH: 33.4 pg (ref 26.0–34.0)
MCHC: 32.8 g/dL (ref 30.0–36.0)
MCV: 101.9 fL — AB (ref 78.0–100.0)
Monocytes Absolute: 0.6 10*3/uL (ref 0.1–1.0)
Monocytes Relative: 15 %
NEUTROS ABS: 2.9 10*3/uL (ref 1.7–7.7)
NEUTROS PCT: 76 %
Platelets: 106 10*3/uL — ABNORMAL LOW (ref 150–400)
RBC: 3.71 MIL/uL — AB (ref 3.87–5.11)
RDW: 16.5 % — ABNORMAL HIGH (ref 11.5–15.5)
WBC: 3.8 10*3/uL — ABNORMAL LOW (ref 4.0–10.5)

## 2015-06-30 LAB — I-STAT CG4 LACTIC ACID, ED
LACTIC ACID, VENOUS: 2.58 mmol/L — AB (ref 0.5–2.0)
Lactic Acid, Venous: 4.48 mmol/L (ref 0.5–2.0)
Lactic Acid, Venous: 2.55 mmol/L (ref 0.5–2.0)

## 2015-06-30 LAB — URINALYSIS, ROUTINE W REFLEX MICROSCOPIC
Bilirubin Urine: NEGATIVE
GLUCOSE, UA: NEGATIVE mg/dL
KETONES UR: NEGATIVE mg/dL
NITRITE: POSITIVE — AB
PH: 7.5 (ref 5.0–8.0)
Protein, ur: >300 mg/dL — AB
SPECIFIC GRAVITY, URINE: 1.013 (ref 1.005–1.030)

## 2015-06-30 LAB — COMPREHENSIVE METABOLIC PANEL
ALBUMIN: 2.2 g/dL — AB (ref 3.5–5.0)
ALK PHOS: 365 U/L — AB (ref 38–126)
ALT: 16 U/L (ref 14–54)
ANION GAP: 13 (ref 5–15)
AST: 61 U/L — ABNORMAL HIGH (ref 15–41)
BILIRUBIN TOTAL: 1.2 mg/dL (ref 0.3–1.2)
BUN: 10 mg/dL (ref 6–20)
CALCIUM: 7.8 mg/dL — AB (ref 8.9–10.3)
CO2: 21 mmol/L — ABNORMAL LOW (ref 22–32)
Chloride: 97 mmol/L — ABNORMAL LOW (ref 101–111)
Creatinine, Ser: 3.89 mg/dL — ABNORMAL HIGH (ref 0.44–1.00)
GFR calc non Af Amer: 12 mL/min — ABNORMAL LOW (ref >60)
GFR, EST AFRICAN AMERICAN: 14 mL/min — AB (ref >60)
GLUCOSE: 182 mg/dL — AB (ref 65–99)
POTASSIUM: 3.9 mmol/L (ref 3.5–5.1)
SODIUM: 131 mmol/L — AB (ref 135–145)
TOTAL PROTEIN: 8.6 g/dL — AB (ref 6.5–8.1)

## 2015-06-30 LAB — I-STAT CHEM 8, ED
BUN: 11 mg/dL (ref 6–20)
CALCIUM ION: 0.92 mmol/L — AB (ref 1.12–1.23)
CHLORIDE: 95 mmol/L — AB (ref 101–111)
Creatinine, Ser: 3.9 mg/dL — ABNORMAL HIGH (ref 0.44–1.00)
GLUCOSE: 188 mg/dL — AB (ref 65–99)
HCT: 40 % (ref 36.0–46.0)
Hemoglobin: 13.6 g/dL (ref 12.0–15.0)
Potassium: 4.3 mmol/L (ref 3.5–5.1)
Sodium: 131 mmol/L — ABNORMAL LOW (ref 135–145)
TCO2: 20 mmol/L (ref 0–100)

## 2015-06-30 LAB — CBG MONITORING, ED: GLUCOSE-CAPILLARY: 170 mg/dL — AB (ref 65–99)

## 2015-06-30 LAB — URINE MICROSCOPIC-ADD ON

## 2015-06-30 MED ORDER — HYDROXYCHLOROQUINE SULFATE 200 MG PO TABS
400.0000 mg | ORAL_TABLET | Freq: Every day | ORAL | Status: DC
Start: 2015-07-01 — End: 2015-07-05
  Administered 2015-07-01 – 2015-07-05 (×5): 400 mg via ORAL
  Filled 2015-06-30 (×5): qty 2

## 2015-06-30 MED ORDER — FOLIC ACID 1 MG PO TABS
1.0000 mg | ORAL_TABLET | Freq: Every day | ORAL | Status: DC
  Administered 2015-07-01 – 2015-07-05 (×5): 1 mg via ORAL
  Filled 2015-06-30 (×5): qty 1

## 2015-06-30 MED ORDER — FLUOXETINE HCL 10 MG PO CAPS
10.0000 mg | ORAL_CAPSULE | Freq: Every day | ORAL | Status: DC
  Administered 2015-07-01 – 2015-07-05 (×5): 10 mg via ORAL
  Filled 2015-06-30 (×6): qty 1

## 2015-06-30 MED ORDER — PIPERACILLIN-TAZOBACTAM IN DEX 2-0.25 GM/50ML IV SOLN
2.2500 g | Freq: Three times a day (TID) | INTRAVENOUS | Status: DC
  Administered 2015-07-01 – 2015-07-04 (×9): 2.25 g via INTRAVENOUS
  Filled 2015-06-30 (×14): qty 50

## 2015-06-30 MED ORDER — VITAMIN B-12 100 MCG PO TABS
250.0000 ug | ORAL_TABLET | Freq: Every evening | ORAL | Status: DC
  Administered 2015-07-01 – 2015-07-04 (×5): 250 ug via ORAL
  Filled 2015-06-30 (×4): qty 3
  Filled 2015-06-30: qty 1
  Filled 2015-06-30 (×2): qty 3
  Filled 2015-06-30: qty 1

## 2015-06-30 MED ORDER — RAMELTEON 8 MG PO TABS
8.0000 mg | ORAL_TABLET | Freq: Every day | ORAL | Status: DC
  Administered 2015-07-01 – 2015-07-04 (×5): 8 mg via ORAL
  Filled 2015-06-30 (×8): qty 1

## 2015-06-30 MED ORDER — VANCOMYCIN HCL IN DEXTROSE 1-5 GM/200ML-% IV SOLN
1000.0000 mg | Freq: Once | INTRAVENOUS | Status: AC
  Administered 2015-06-30: 1000 mg via INTRAVENOUS
  Filled 2015-06-30: qty 200

## 2015-06-30 MED ORDER — PANCRELIPASE (LIP-PROT-AMYL) 12000-38000 UNITS PO CPEP
12000.0000 [IU] | ORAL_CAPSULE | Freq: Three times a day (TID) | ORAL | Status: DC
  Administered 2015-07-01 – 2015-07-04 (×12): 12000 [IU] via ORAL
  Filled 2015-06-30 (×12): qty 1

## 2015-06-30 MED ORDER — PIPERACILLIN-TAZOBACTAM 3.375 G IVPB 30 MIN
3.3750 g | Freq: Once | INTRAVENOUS | Status: AC
  Administered 2015-06-30: 3.375 g via INTRAVENOUS
  Filled 2015-06-30: qty 50

## 2015-06-30 MED ORDER — GABAPENTIN 300 MG PO CAPS
300.0000 mg | ORAL_CAPSULE | Freq: Every day | ORAL | Status: DC
  Administered 2015-07-01 – 2015-07-05 (×5): 300 mg via ORAL
  Filled 2015-06-30 (×5): qty 1

## 2015-06-30 MED ORDER — PANTOPRAZOLE SODIUM 40 MG PO TBEC
40.0000 mg | DELAYED_RELEASE_TABLET | Freq: Every day | ORAL | Status: DC
  Administered 2015-07-01 – 2015-07-05 (×5): 40 mg via ORAL
  Filled 2015-06-30 (×5): qty 1

## 2015-06-30 MED ORDER — VANCOMYCIN HCL 500 MG IV SOLR
500.0000 mg | INTRAVENOUS | Status: DC
  Administered 2015-07-01 – 2015-07-02 (×2): 500 mg via INTRAVENOUS
  Filled 2015-06-30 (×3): qty 500

## 2015-06-30 MED ORDER — SODIUM CHLORIDE 0.9% FLUSH
3.0000 mL | Freq: Two times a day (BID) | INTRAVENOUS | Status: DC
  Administered 2015-07-01 – 2015-07-04 (×7): 3 mL via INTRAVENOUS

## 2015-06-30 MED ORDER — MIRTAZAPINE 7.5 MG PO TABS
7.5000 mg | ORAL_TABLET | Freq: Every day | ORAL | Status: DC
  Administered 2015-07-01 – 2015-07-04 (×5): 7.5 mg via ORAL
  Filled 2015-06-30 (×5): qty 1

## 2015-06-30 MED ORDER — HEPARIN SODIUM (PORCINE) 5000 UNIT/ML IJ SOLN
5000.0000 [IU] | Freq: Three times a day (TID) | INTRAMUSCULAR | Status: DC
  Administered 2015-07-01 – 2015-07-05 (×12): 5000 [IU] via SUBCUTANEOUS
  Filled 2015-06-30 (×12): qty 1

## 2015-06-30 MED ORDER — LORAZEPAM 1 MG PO TABS: 1.0000 mg | ORAL_TABLET | Freq: Four times a day (QID) | ORAL | Status: AC | PRN

## 2015-06-30 MED ORDER — VITAMIN B-1 100 MG PO TABS
100.0000 mg | ORAL_TABLET | Freq: Every day | ORAL | Status: DC
  Administered 2015-07-01 – 2015-07-04 (×4): 100 mg via ORAL
  Filled 2015-06-30 (×4): qty 1

## 2015-06-30 MED ORDER — ALBUTEROL SULFATE HFA 108 (90 BASE) MCG/ACT IN AERS: 1.0000 | INHALATION_SPRAY | Freq: Four times a day (QID) | RESPIRATORY_TRACT | Status: DC | PRN

## 2015-06-30 MED ORDER — SODIUM CHLORIDE 0.9 % IV BOLUS (SEPSIS)
1000.0000 mL | Freq: Once | INTRAVENOUS | Status: AC
  Administered 2015-06-30: 1000 mL via INTRAVENOUS

## 2015-06-30 MED ORDER — SODIUM CHLORIDE 0.9 % IV SOLN
INTRAVENOUS | Status: AC
  Administered 2015-06-30: 23:00:00 via INTRAVENOUS
  Administered 2015-07-01: 100 mL/h via INTRAVENOUS

## 2015-06-30 MED ORDER — AZATHIOPRINE 50 MG PO TABS
50.0000 mg | ORAL_TABLET | Freq: Every day | ORAL | Status: DC
  Administered 2015-07-01 – 2015-07-05 (×5): 50 mg via ORAL
  Filled 2015-06-30 (×5): qty 1

## 2015-06-30 MED ORDER — NEPRO/CARBSTEADY PO LIQD: 237.0000 mL | Freq: Two times a day (BID) | ORAL | Status: DC

## 2015-06-30 MED ORDER — LORAZEPAM 2 MG/ML IJ SOLN: 1.0000 mg | Freq: Four times a day (QID) | INTRAMUSCULAR | Status: AC | PRN

## 2015-06-30 MED ORDER — ALBUTEROL SULFATE (2.5 MG/3ML) 0.083% IN NEBU: 2.5000 mg | INHALATION_SOLUTION | Freq: Four times a day (QID) | RESPIRATORY_TRACT | Status: DC | PRN

## 2015-06-30 NOTE — Consult Note (Signed)
[facility] Consult Note    Chief Complaint: Weakness and fevers  Reason for consult: To manage dialysis and dialysis related needs  Assessment: 1. Fevers and weakness - No obvious source but suspicious for influenza; spouse is also sick with the same symptoms. Spouse recently received chemotherapy as well. May consider starting Tamiflu prior to influenza test results. The graft does not appear to be infected although she is very warm to touch over the entire body. Agree that there are no systemic signs of SLE reactivation and usually SLE is quiescent once patients progress to ESRD. 2. ESRD - Last HD on Monday at [facility] with Dr. Joelyn Oms; no acute indications for dialysis today. Will HD in AM with no UF as she doesn't appear to have much volume onboard and likely has large insensible losses with the high fevers. 3. Anemia - stable and actually above goal. Will need to adjust the Mircera when pt is discharged (currently receiving 194mcg q2weeks). 4. Metabolic bone disease - Will check Phos to aid with binders but will manage the PTH as an outpt. Calcitriol 1.25 PO MWF during dialysis. 5. HTN - Fairly stable. 6. Volume - Does not appear to have much on; therefore, no UF tomorrow. 7. SLE 8. Malnutrition and history alcohol abuse.    HPI:  [name] [name] is a pleasant 60 y.o. female with a history of SLE on dialysis MWF at [facility] with Dr. Pearson Grippe with last HD on Monday. She went to dialysis today but did not actually receive treatment because of noted high grade fevers. She has had fevers for the past day and her husband has the same symptoms at home; husband  having received chemotherapy. She has had diarrhea two days ago and today had nausea and vomiting. She also has a nonproductive cough with fever and rigors. She denies any chest pain, abdominal pain or new rashes or photophobia. She had cultures drawn in the ED and started empirically on Vancomycin and Zosyn.   ROS Pertinent  items are noted in HPI.  Past Medical History  Past Medical History  Diagnosis Date  . Anemia, B12 deficiency   . History of acute pancreatitis   . Right knee pain     No recent imaging on chart  . 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 Dr. Mancel Bale, [facility]  . Hypertriglyceridemia   . GERD (gastroesophageal reflux disease)   . Subdural hematoma ([place]) 02/2008    Likely 2/2 trauma from seizure from EtOH withdrawal, chronic in nature, sees Dr. Jerene Bears. Most recent CT head 10/2009 showing stable but persistent hematoma without mass effect.  . History of seizure disorder     Likely 2/2 alcohol abuse  . Hypocalcemia   . Hypomagnesemia   . Failure to thrive in childhood     Unclear etiology  . HTN (hypertension)   . Thrombocytopenia ([place])   . Hepatomegaly     On exam  . Joint pain   . Alcohol abuse   . Vitamin D deficiency   . Pancreatitis   . Insomnia   . Hyperlipidemia   . Pernicious anemia   . Macrocytic anemia   . 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 Depression   . Fx humeral neck 04/17/2011    Transverse fracture- minimally displaced- managed as outpatient   . ABNORMAL PAP SMEAR, LGSIL 07/23/2008    Annotation: HPV positive CIN I Dr. Mancel Bale, [facility] Qualifier: Diagnosis of  By: Oretha Ellis    . Pneumonia 05/20/2012  . Arthritis     "shoulders" (08/15/2013)  . 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  Marland Kitchen Chronic diastolic CHF (congestive heart failure) ([place])   . Hypomagnesemia   . Seizures ([place])     "don't know when/why I had them; daughter was always there w/me"  . On home oxygen therapy     "3L; mostly at night" (06/19/2014)  . Shortness of breath dyspnea   . Pleural effusion    Past Surgical History  Past Surgical History  Procedure Laterality Date  . Cesarean section  1983  . Esophagogastroduodenoscopy  07/11/2011    Procedure:  ESOPHAGOGASTRODUODENOSCOPY (EGD);  Surgeon: Beryle Beams, MD;  Location: [facility];  Service: Endoscopy;  Laterality: N/A;  . Colonoscopy  07/11/2011    Procedure: COLONOSCOPY;  Surgeon: Beryle Beams, MD;  Location: [facility];  Service: Endoscopy;  Laterality: N/A;  . Eye surgery Left     "trauma"  . Right colectomy  08/28/2011  . Esophagogastroduodenoscopy N/A 12/01/2012    Procedure: ESOPHAGOGASTRODUODENOSCOPY (EGD);  Surgeon: Irene Shipper, MD;  Location: [facility];  Service: Endoscopy;  Laterality: N/A;  . Colonoscopy with esophagogastroduodenoscopy (egd) Left 08/21/2013    Procedure: COLONOSCOPY WITH ESOPHAGOGASTRODUODENOSCOPY (EGD);  Surgeon: Beryle Beams, MD;  Location: [facility] ENDOSCOPY;  Service: Endoscopy;  Laterality: Left;  . Subxyphoid pericardial window N/A 12/04/2013    Procedure: SUBXYPHOID PERICARDIAL WINDOW WITH TEE;  Surgeon: Ivin Poot, MD;  Location: [place];  Service: Thoracic;  Laterality: N/A;  . Intraoperative transesophageal echocardiogram N/A 12/04/2013    Procedure: INTRAOPERATIVE TRANSESOPHAGEAL ECHOCARDIOGRAM;  Surgeon: Ivin Poot, MD;  Location: [place];  Service: Open Heart Surgery;  Laterality: N/A;  . Exchange of a dialysis catheter Left 10/22/2014    Procedure: EXCHANGE OF A DIALYSIS CATHETER;  Surgeon: Conrad Morgan, MD;  Location: [place];  Service: Vascular;  Laterality: Left;  . Av fistula placement Left 12/03/2014    Procedure: INSERTION OF ARTERIOVENOUS (AV) GORE-TEX GRAFT ARM;  Surgeon: Mal Misty, MD;  Location[facility] OR;  Service: Vascular;  Laterality: Left;   Family History  Family History  Problem Relation Age of Onset  . Cancer Mother     Died from stomach cancer and "flesh eating rash  . Heart failure Father     Died in 42s from an MI  . Alcohol abuse Sister     Twin sister drinks a lot, as did both her parents and brothers  . Stroke Brother     Has 7 brothers, 1 with CVA  . Lupus Mother    Social History  reports that she quit  smoking about 4 years ago. Her smoking use included Cigarettes. She has a 20 pack-year smoking history. She has never used smokeless tobacco. She reports that she drinks alcohol. She reports that she uses illicit drugs (Marijuana and Cocaine). Allergies  Allergies  Allergen Reactions  . Amitriptyline Hcl Swelling    In the face.  . Doxycycline Hyclate Itching    Feels like something crawling under her skin   Home medications Prior to Admission medications   Medication Sig Start Date End Date Taking? Authorizing Provider  albuterol (PROAIR HFA) 108 (90 BASE) MCG/ACT inhaler Inhale 1-2 puffs into the lungs every 6 (six) hours as needed for wheezing or shortness of breath. 01/14/14  Yes Jacquelyn S  Gordy Levan, MD  azaTHIOprine (IMURAN) 50 MG tablet Take 50 mg by mouth daily. 06/29/15  Yes Historical Provider, MD  calcium carbonate (TUMS - DOSED IN MG ELEMENTAL CALCIUM) 500 MG chewable tablet Chew 1,000 tablets by mouth at bedtime.   Yes Historical Provider, MD  camphor-menthol Timoteo Ace) lotion Apply 1 application topically daily as needed for itching.   Yes Historical Provider, MD  cetirizine (ZYRTEC) 10 MG tablet Take 1 tablet (10 mg total) by mouth daily as needed for allergies. 06/11/15  Yes Shela Leff, MD  CREON 12000 UNITS CPEP capsule Take 1 capsule (12,000 Units total) by mouth 3 (three) times daily before meals. 01/21/15  Yes Shela Leff, MD  FLUoxetine (PROZAC) 10 MG capsule Take 1 capsule (10 mg total) by mouth daily. For depression 01/21/15  Yes Shela Leff, MD  folic acid (FOLVITE) 1 MG tablet Take 1 tablet (1 mg total) by mouth daily. For folic acid replacement 06/08/15  Yes Shela Leff, MD  gabapentin (NEURONTIN) 300 MG capsule Take 1 capsule (300 mg total) by mouth daily. 04/09/15 04/06/16 Yes Shela Leff, MD  hydroxychloroquine (PLAQUENIL) 200 MG tablet Take 400 mg by mouth daily.   Yes Historical Provider, MD  Lactobacillus (ACIDOPHILUS PROBIOTIC PO) Take 2 mg by  mouth 3 (three) times daily. Take 2 (two) tablets by mouth three times daily   Yes Historical Provider, MD  loperamide (IMODIUM) 2 MG capsule Take 2 mg by mouth every 6 (six) hours as needed for diarrhea or loose stools.   Yes Historical Provider, MD  mirtazapine (REMERON) 7.5 MG tablet Take 1 tablet (7.5 mg total) by mouth at bedtime. 01/21/15  Yes Shela Leff, MD  Multiple Vitamin (MULTIVITAMIN WITH MINERALS) TABS tablet Take 1 tablet by mouth daily. For vitamin replacement 03/24/15  Yes Shela Leff, MD  omeprazole (PRILOSEC) 40 MG capsule Take 1 capsule (40 mg total) by mouth daily. 01/21/15  Yes Shela Leff, MD  ramelteon (ROZEREM) 8 MG tablet Take 1 tablet (8 mg total) by mouth at bedtime. 01/21/15  Yes Shela Leff, MD  thiamine (VITAMIN B-1) 100 MG tablet Take 1 tablet (100 mg total) by mouth daily. For low thiamine 01/21/15  Yes Shela Leff, MD  vitamin B-12 (CYANOCOBALAMIN) 250 MCG tablet Take 1 tablet (250 mcg total) by mouth every evening. 01/21/15  Yes Shela Leff, MD    Current facility-administered medications:  .  piperacillin-tazobactam (ZOSYN) IVPB 2.25 g, 2.25 g, Intravenous, 3 times per day, Para March, RPH .  [START ON 07/02/2015] vancomycin (VANCOCIN) 500 mg in sodium chloride 0.9 % 100 mL IVPB, 500 mg, Intravenous, Q M,W,F-HD, Valeda Malm Rumbarger, RPH  Current outpatient prescriptions:  .  albuterol (PROAIR HFA) 108 (90 BASE) MCG/ACT inhaler, Inhale 1-2 puffs into the lungs every 6 (six) hours as needed for wheezing or shortness of breath., Disp: 1 Inhaler, Rfl: 1 .  azaTHIOprine (IMURAN) 50 MG tablet, Take 50 mg by mouth daily., Disp: , Rfl:  .  calcium carbonate (TUMS - DOSED IN MG ELEMENTAL CALCIUM) 500 MG chewable tablet, Chew 1,000 tablets by mouth at bedtime., Disp: , Rfl:  .  camphor-menthol (SARNA) lotion, Apply 1 application topically daily as needed for itching., Disp: , Rfl:  .  cetirizine (ZYRTEC) 10 MG tablet, Take 1 tablet (10  mg total) by mouth daily as needed for allergies., Disp: 30 tablet, Rfl: 3 .  CREON 12000 UNITS CPEP capsule, Take 1 capsule (12,000 Units total) by mouth 3 (three) times daily before meals., Disp: 270 capsule, Rfl:  3 .  FLUoxetine (PROZAC) 10 MG capsule, Take 1 capsule (10 mg total) by mouth daily. For depression, Disp: 90 capsule, Rfl: 3 .  folic acid (FOLVITE) 1 MG tablet, Take 1 tablet (1 mg total) by mouth daily. For folic acid replacement, Disp: 30 tablet, Rfl: 6 .  gabapentin (NEURONTIN) 300 MG capsule, Take 1 capsule (300 mg total) by mouth daily., Disp: 90 capsule, Rfl: 3 .  hydroxychloroquine (PLAQUENIL) 200 MG tablet, Take 400 mg by mouth daily., Disp: , Rfl:  .  Lactobacillus (ACIDOPHILUS PROBIOTIC PO), Take 2 mg by mouth 3 (three) times daily. Take 2 (two) tablets by mouth three times daily, Disp: , Rfl:  .  loperamide (IMODIUM) 2 MG capsule, Take 2 mg by mouth every 6 (six) hours as needed for diarrhea or loose stools., Disp: , Rfl:  .  mirtazapine (REMERON) 7.5 MG tablet, Take 1 tablet (7.5 mg total) by mouth at bedtime., Disp: 90 tablet, Rfl: 3 .  Multiple Vitamin (MULTIVITAMIN WITH MINERALS) TABS tablet, Take 1 tablet by mouth daily. For vitamin replacement, Disp: 90 tablet, Rfl: 3 .  omeprazole (PRILOSEC) 40 MG capsule, Take 1 capsule (40 mg total) by mouth daily., Disp: 90 capsule, Rfl: 3 .  ramelteon (ROZEREM) 8 MG tablet, Take 1 tablet (8 mg total) by mouth at bedtime., Disp: , Rfl:  .  thiamine (VITAMIN B-1) 100 MG tablet, Take 1 tablet (100 mg total) by mouth daily. For low thiamine, Disp: 90 tablet, Rfl: 3 .  vitamin B-12 (CYANOCOBALAMIN) 250 MCG tablet, Take 1 tablet (250 mcg total) by mouth every evening., Disp: 90 tablet, Rfl: 3 Liver Function Tests  Recent Labs Lab 06/30/15 1320  AST 61*  ALT 16  ALKPHOS 365*  BILITOT 1.2  PROT 8.6*  ALBUMIN 2.2*   No results for input(s): LIPASE, AMYLASE in the last 168 hours. CBC  Recent Labs Lab 06/30/15 1303  06/30/15 1320  WBC  --  3.8*  NEUTROABS  --  2.9  HGB 13.6 12.4  HCT 40.0 37.8  MCV  --  101.9*  PLT  --  [id number]*   Basic Metabolic Panel  Recent Labs Lab 06/30/15 1303 06/30/15 1320  NA 131* 131*  K 4.3 3.9  CL 95* 97*  CO2  --  21*  GLUCOSE 188* 182*  BUN 11 10  CREATININE 3.90* 3.89*  CALCIUM  --  7.8*    Physical Exam:  Blood pressure 134/78, pulse 100, temperature 99.2 F (37.3 C), temperature source Oral, resp. rate 19, height 5\' 1"  (1.549 m), weight 37.2 kg (82 lb 0.2 oz), SpO2 100 %. Gen: NAD but extremely warm to touch diffusely Skin: no rash, cyanosis HEENT:  EOMI, sclera anicteric, throat clear Neck: No JVD noted Chest: CTA B/L CV: tachycardic Abdomen: soft, NDNT +BS Ext: No edema, no joint effusion, no gangrene/ulcers Neuro: alert, Ox3, nonfocal, pleasant  Outpatient HD: [facility] Calcitriol 1.25 MWF PO  Mircera 186mcg Ezra Sites  MD Work  [id number] 06/30/2015, 7:24 PM

## 2015-06-30 NOTE — Progress Notes (Signed)
ANTIBIOTIC CONSULT NOTE - INITIAL  Pharmacy Consult for vancomycin + zosyn Indication: rule out sepsis  Allergies  Allergen Reactions  . Amitriptyline Hcl Swelling    In the face.  . Doxycycline Hyclate Itching    Feels like something crawling under her skin    Patient Measurements: Height: 5\' 1"  (154.9 cm) Weight: 82 lb 0.2 oz (37.2 kg) IBW/kg (Calculated) : 47.8 Adjusted Body Weight:   Vital Signs: Temp: 103 F (39.4 C) (01/25 1225) Temp Source: Oral (01/25 1225) BP: 102/75 mmHg (01/25 1225) Pulse Rate: 120 (01/25 1225) Intake/Output from previous day:   Intake/Output from this shift:    Labs: No results for input(s): WBC, HGB, PLT, LABCREA, CREATININE in the last 72 hours. CrCl cannot be calculated (Patient has no serum creatinine result on file.). No results for input(s): VANCOTROUGH, VANCOPEAK, VANCORANDOM, GENTTROUGH, GENTPEAK, GENTRANDOM, TOBRATROUGH, TOBRAPEAK, TOBRARND, AMIKACINPEAK, AMIKACINTROU, AMIKACIN in the last 72 hours.   Microbiology: No results found for this or any previous visit (from the past 720 hour(s)).  Medical History: Past Medical History  Diagnosis Date  . Anemia, B12 deficiency   . History of acute pancreatitis   . Right knee pain     No recent imaging on chart  . 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 Dr. Mancel Bale, [facility]  . Hypertriglyceridemia   . GERD (gastroesophageal reflux disease)   . Subdural hematoma ([place]) 02/2008    Likely 2/2 trauma from seizure from EtOH withdrawal, chronic in nature, sees Dr. Jerene Bears. Most recent CT head 10/2009 showing stable but persistent hematoma without mass effect.  . History of seizure disorder     Likely 2/2 alcohol abuse  . Hypocalcemia   . Hypomagnesemia   . Failure to thrive in childhood     Unclear etiology  . HTN (hypertension)   . Thrombocytopenia ([place])   . Hepatomegaly     On exam  . Joint pain   . Alcohol  abuse   . Vitamin D deficiency   . Pancreatitis   . Insomnia   . Hyperlipidemia   . Pernicious anemia   . Macrocytic anemia   . 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 Depression   . Fx humeral neck 04/17/2011    Transverse fracture- minimally displaced- managed as outpatient   . ABNORMAL PAP SMEAR, LGSIL 07/23/2008    Annotation: HPV positive CIN I Dr. Mancel Bale, [facility] Qualifier: Diagnosis of  By: Oretha Ellis    . Pneumonia 05/20/2012  . Arthritis     "shoulders" (08/15/2013)  . 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  Marland Kitchen Chronic diastolic CHF (congestive heart failure) ([place])   . Hypomagnesemia   . Seizures ([place])     "don't know when/why I had them; daughter was always there w/me"  . On home oxygen therapy     "3L; mostly at night" (06/19/2014)  . Shortness of breath dyspnea   . Pleural effusion     Medications:  Anti-infectives    Start     Dose/Rate Route Frequency Ordered Stop   07/02/15 1200  vancomycin (VANCOCIN) 500 mg in sodium chloride 0.9 % 100 mL IVPB     500 mg 100 mL/hr over 60 Minutes Intravenous Every M-W-F (Hemodialysis) 06/30/15 1310     06/30/15 2200  piperacillin-tazobactam (ZOSYN) IVPB 2.25 g     2.25 g 100  mL/hr over 30 Minutes Intravenous 3 times per day 06/30/15 1310     06/30/15 1245  piperacillin-tazobactam (ZOSYN) IVPB 3.375 g     3.375 g 100 mL/hr over 30 Minutes Intravenous  Once 06/30/15 1236     06/30/15 1245  vancomycin (VANCOCIN) IVPB 1000 mg/200 mL premix     1,000 mg 200 mL/hr over 60 Minutes Intravenous  Once 06/30/15 1236       Assessment: 89 yof presented to the ED form hemodialysis with fever, chills and vomiting. Pt did not complete HD today. Tmax is 103, WBC is pending and lactic is elevated at 4.48. To start empiric vancomycin and zosyn for possible sepsis.   Vanc 1/25>> Zosyn 1/25>>   Goal of Therapy:  Vancomycin pre-HD level 15-25 mcg/ml  Plan:  - Vancomycin 1gm  IV x 1 then 500mg  post-HD - Zosyn 3.375gm IV x 1 then 2.25gm IV Q8H - F/u renal plans, C&S, clinical status and pre-HD level when appropriate  Arlen Dupuis, Rande Lawman 06/30/2015,1:11 PM

## 2015-06-30 NOTE — ED Notes (Signed)
Pt reminded of need for urine.  Patient states she does not need to go.  This RN will attempt bed pain in 30 minutes when Vanc completes, and speak to PA about bladder scan/in and out if patient unable to retrieve.

## 2015-06-30 NOTE — ED Notes (Signed)
Admitting at bedside 

## 2015-06-30 NOTE — H&P (Signed)
Date: 06/30/2015               Patient Name:  [name] [name] MRN: [id number]  DOB: 04-08-1956 Age / Sex: 60 y.o., female   PCP: Shela Leff, MD         Medical Service: Internal Medicine Teaching Service         Attending Physician: Dr. Aldine Contes    First Contact: Dr. Liberty Handy Pager: [phone]  Second Contact: Dr. 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 This problem started this morning when she woke up. She felt nauseous and had one episode of yellowish, clear vomit. She also had chills, generalized weakness, and diminished appetite. She decided to go to dialysis anyway. When she arrived she was found to be hypotensive and febrile to 103 F. She did not initiate a dialysis session. Her only other complains are pruritic rashes, dry skin, hair loss, longstanding dyspnea on exertion, longstanding dry cough, loose stools two days ago that has since resolved. She denies headache, neck stiffness, blackouts, vision changes, sore throat, chest pain, heart palpitations, dysuria, polyuria, tenderness at her AVF site, back pain, abdominal pain, productive cough, or joint pain. She has had an AVF infection in the past (12/2014). She follows with Drs. Marella Chimes and Leigh Aurora for her SLE, and has an appointment in February. She has a 10 pack-year smoking history. Alcohol use disorder is listed in her medical history but she denies any use. She has reportedly drank gin up to 3x a week and has had an EtOH withdrawal seizure. She denies any drug use. In the ED, she was noted to have a temperature of 103, pulse 120, and  leukopenic to 3.8. Lactic acid was 4.48 and downtrended to 2.55. Blood and urine cultures were drawn, and she was started on vancomycin and zosyn.  Meds: Current Facility-Administered Medications  Medication Dose Route Frequency Provider Last Rate Last Dose  . piperacillin-tazobactam (ZOSYN) IVPB 2.25 g  2.25 g Intravenous 3 times per day Para March, RPH      . [START ON 07/02/2015] vancomycin (VANCOCIN) 500 mg in sodium chloride 0.9 % 100 mL IVPB  500 mg Intravenous Q M,W,F-HD Valeda Malm Rumbarger, [facility]       Current Outpatient Prescriptions  Medication Sig Dispense Refill  . albuterol (PROAIR HFA) 108 (90 BASE) MCG/ACT inhaler Inhale 1-2 puffs into the lungs every 6 (six) hours as needed for wheezing or shortness of breath. 1 Inhaler 1  . azaTHIOprine (IMURAN) 50 MG tablet Take 50 mg by mouth daily.    . calcium carbonate (TUMS - DOSED IN MG ELEMENTAL CALCIUM) 500 MG chewable tablet Chew 1,000 tablets by mouth at bedtime.    . camphor-menthol (SARNA) lotion Apply 1 application topically daily as needed for itching.    . cetirizine (ZYRTEC) 10 MG tablet Take 1 tablet (10 mg total) by mouth daily as needed for allergies. 30 tablet 3  . CREON 12000 UNITS CPEP capsule Take 1 capsule (12,000 Units total) by mouth 3 (three) times daily before meals. 270 capsule 3  . FLUoxetine (PROZAC) 10 MG capsule Take 1  capsule (10 mg total) by mouth daily. For depression 90 capsule 3  . folic acid (FOLVITE) 1 MG tablet Take 1 tablet (1 mg total) by mouth daily. For folic acid replacement 30 tablet 6  . gabapentin (NEURONTIN) 300 MG capsule Take 1 capsule (300 mg total) by mouth daily. 90 capsule 3  . hydroxychloroquine (PLAQUENIL) 200 MG tablet Take 400 mg by mouth daily.    . Lactobacillus (ACIDOPHILUS PROBIOTIC PO) Take 2 mg by mouth 3 (three) times daily. Take 2 (two) tablets by mouth three times daily    . loperamide (IMODIUM) 2 MG capsule Take 2 mg by mouth every 6 (six) hours as needed for diarrhea or  loose stools.    . mirtazapine (REMERON) 7.5 MG tablet Take 1 tablet (7.5 mg total) by mouth at bedtime. 90 tablet 3  . Multiple Vitamin (MULTIVITAMIN WITH MINERALS) TABS tablet Take 1 tablet by mouth daily. For vitamin replacement 90 tablet 3  . omeprazole (PRILOSEC) 40 MG capsule Take 1 capsule (40 mg total) by mouth daily. 90 capsule 3  . ramelteon (ROZEREM) 8 MG tablet Take 1 tablet (8 mg total) by mouth at bedtime.    . thiamine (VITAMIN B-1) 100 MG tablet Take 1 tablet (100 mg total) by mouth daily. For low thiamine 90 tablet 3  . vitamin B-12 (CYANOCOBALAMIN) 250 MCG tablet Take 1 tablet (250 mcg total) by mouth every evening. 90 tablet 3    Allergies: Allergies as of 06/30/2015 - Review Complete 06/30/2015  Allergen Reaction Noted  . Amitriptyline hcl Swelling   . Doxycycline hyclate Itching 11/10/2010   Past Medical History  Diagnosis Date  . Anemia, B12 deficiency   . History of acute pancreatitis   . Right knee pain     No recent imaging on chart  . 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 Dr. Mancel Bale, [facility]  . Hypertriglyceridemia   . GERD (gastroesophageal reflux disease)   . Subdural hematoma ([place]) 02/2008    Likely 2/2 trauma from seizure from EtOH withdrawal, chronic in nature, sees Dr. Jerene Bears. Most recent CT head 10/2009 showing stable but persistent hematoma without mass effect.  . History of seizure disorder     Likely 2/2 alcohol abuse  . Hypocalcemia   . Hypomagnesemia   . Failure to thrive in childhood     Unclear etiology  . HTN (hypertension)   . Thrombocytopenia ([place])   . Hepatomegaly     On exam  . Joint pain   . Alcohol abuse   . Vitamin D deficiency   . Pancreatitis   . Insomnia   . Hyperlipidemia   . Pernicious anemia   . Macrocytic anemia   . 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 Depression   . Fx humeral neck 04/17/2011    Transverse fracture- minimally displaced-  managed as outpatient   . ABNORMAL PAP SMEAR, LGSIL 07/23/2008    Annotation: HPV positive CIN I Dr. Mancel Bale, [facility] Qualifier: Diagnosis of  By: Oretha Ellis    . Pneumonia 05/20/2012  . Arthritis     "shoulders" (08/15/2013)  . 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  Marland Kitchen Chronic diastolic CHF (congestive heart failure) ([place])   . Hypomagnesemia   . Seizures ([place])     "don't know when/why I had them; daughter was always there w/me"  .  On home oxygen therapy     "3L; mostly at night" (06/19/2014)  . Shortness of breath dyspnea   . Pleural effusion    Past Surgical History  Procedure Laterality Date  . Cesarean section  1983  . Esophagogastroduodenoscopy  07/11/2011    Procedure: ESOPHAGOGASTRODUODENOSCOPY (EGD);  Surgeon: Beryle Beams, MD;  Location: [facility];  Service: Endoscopy;  Laterality: N/A;  . Colonoscopy  07/11/2011    Procedure: COLONOSCOPY;  Surgeon: Beryle Beams, MD;  Location: [facility];  Service: Endoscopy;  Laterality: N/A;  . Eye surgery Left     "trauma"  . Right colectomy  08/28/2011  . Esophagogastroduodenoscopy N/A 12/01/2012    Procedure: ESOPHAGOGASTRODUODENOSCOPY (EGD);  Surgeon: Irene Shipper, MD;  Location: [facility];  Service: Endoscopy;  Laterality: N/A;  . Colonoscopy with esophagogastroduodenoscopy (egd) Left 08/21/2013    Procedure: COLONOSCOPY WITH ESOPHAGOGASTRODUODENOSCOPY (EGD);  Surgeon: Beryle Beams, MD;  Location: [facility] ENDOSCOPY;  Service: Endoscopy;  Laterality: Left;  . Subxyphoid pericardial window N/A 12/04/2013    Procedure: SUBXYPHOID PERICARDIAL WINDOW WITH TEE;  Surgeon: Ivin Poot, MD;  Location: [place];  Service: Thoracic;  Laterality: N/A;  . Intraoperative transesophageal echocardiogram N/A 12/04/2013    Procedure: INTRAOPERATIVE TRANSESOPHAGEAL ECHOCARDIOGRAM;  Surgeon: Ivin Poot, MD;  Location: [place];  Service: Open Heart Surgery;  Laterality: N/A;  . Exchange  of a dialysis catheter Left 10/22/2014    Procedure: EXCHANGE OF A DIALYSIS CATHETER;  Surgeon: Conrad [place], MD;  Location: [place];  Service: Vascular;  Laterality: Left;  . Av fistula placement Left 12/03/2014    Procedure: INSERTION OF ARTERIOVENOUS (AV) GORE-TEX GRAFT ARM;  Surgeon: Mal Misty, MD;  [facility];  Service: Vascular;  Laterality: Left;   Family History  Problem Relation Age of Onset  . Cancer Mother     Died from stomach cancer and "flesh eating rash  . Heart failure Father     Died in 61s from an MI  . Alcohol abuse Sister     Twin sister drinks a lot, as did both her parents and brothers  . Stroke Brother     Has 7 brothers, 1 with CVA  . Lupus Mother    Social History   Social History  . Marital Status: Divorced    Spouse Name: N/A  . Number of Children: N/A  . Years of Education: N/A   Occupational History  . Not on file.   Social History Main Topics  . Smoking status: Former Smoker -- 0.50 packs/day for 40 years    Types: Cigarettes    Quit date: 09/20/2010  . Smokeless tobacco: Never Used  . Alcohol Use: 0.0 oz/week    0 Standard drinks or equivalent per week     Comment: 12/02/14 "graduated from [employer] (for alcohol abuse) in October 2015"   . Drug Use: Yes    Special: Marijuana, Cocaine     Comment: 12/02/14 "last drug use was in 2012"  . Sexual Activity: Not Currently   Other Topics Concern  . Not on file   Social History Narrative   Lives with her significant other and 2 grandchildren. 1 child   Has 7 brothers and 4 sisters, 1 twin sister.   Unemployed, worked in [employer].    Abuses alcohol-drinks 1 glass of wine daily    No drug use. Former cigarette use quit 1.5 years ago.     11 th grade education  Review of Systems: All systems negative except per HPI  Physical Exam: Blood pressure 106/71, pulse 94, temperature 99.2 F (37.3 C), temperature source Oral, resp. rate 22, height 5\' 1"  (1.549 m),  weight 82 lb 0.2 oz (37.2 kg), SpO2 100 %. General: This appearing. Lying in bed, NAD HEENT: Hair loss noted. NCAT, sclerae anicteric, EOMI, PERRL, dry mucous membranes, tonsillar erythema without exudate, no JVD, no cervical adenopathy, normal neck ROM Cardiovascular: Continuous murmur. Regular rhythm, no rubs. Palpable thrill on left AVF, nontender to palpation Pulmonary: CTAB, unlabored breathing Abdominal: Soft, NT/ND Extremities: No clubbing, cyanosis, or edema Skin: Dry. Excoriations on back and legs. Neurological: AAOx3. Tongue midline, face symmetric.  Psychiatric: Normal behavior and affect   Lab results: Basic Metabolic Panel:  Recent Labs  06/30/15 1303 06/30/15 1320  NA 131* 131*  K 4.3 3.9  CL 95* 97*  CO2  --  21*  GLUCOSE 188* 182*  BUN 11 10  CREATININE 3.90* 3.89*  CALCIUM  --  7.8*   Liver Function Tests:  Recent Labs  06/30/15 1320  AST 61*  ALT 16  ALKPHOS 365*  BILITOT 1.2  PROT 8.6*  ALBUMIN 2.2*   CBC:  Recent Labs  06/30/15 1303 06/30/15 1320  WBC  --  3.8*  NEUTROABS  --  2.9  HGB 13.6 12.4  HCT 40.0 37.8  MCV  --  101.9*  PLT  --  106*   CBG:  Recent Labs  06/30/15 1230  GLUCAP 170*   Urine Drug Screen: Drugs of Abuse     Component Value Date/Time   LABOPIA POSITIVE* 10/13/2014 0412   LABOPIA NEG 09/18/2011 0936   COCAINSCRNUR NONE DETECTED 10/13/2014 0412   COCAINSCRNUR NEG 09/18/2011 0936   LABBENZ NONE DETECTED 10/13/2014 0412   LABBENZ NEG 09/18/2011 0936   LABBENZ NEG 04/10/2011 1130   AMPHETMU NONE DETECTED 10/13/2014 0412   AMPHETMU NEG 09/18/2011 0936   AMPHETMU NEG 04/10/2011 1130   THCU NONE DETECTED 10/13/2014 0412   THCU NEG 09/18/2011 0936   LABBARB NONE DETECTED 10/13/2014 0412   LABBARB NEG 09/18/2011 0936     Imaging results:  Dg Chest Port 1 View  06/30/2015  CLINICAL DATA:  Patient is febrile with chills and vomiting. EXAM: PORTABLE CHEST 1 VIEW COMPARISON:  12/06/2014 FINDINGS:  Cardiomediastinal silhouette is normal. Mediastinal contours appear intact. There is no evidence of focal airspace consolidation, pleural effusion or pneumothorax. Osseous structures are without acute abnormality. Soft tissues are grossly normal. IMPRESSION: No active disease. Electronically Signed   By: Fidela Salisbury M.D.   On: 06/30/2015 13:19   EKG: Sinus Tachycardia, QTc 493  Assessment & Plan by Problem:  Systemic Inflammatory Response Syndrome: Patient meets 4/4 SIRS criteria. No clear source of infection based on symptomatology. Considerations are an AVF infection (sx did not begin with dialysis, no AVF site tenderness or erythema), UTI, pneumonia (SpO2 100% RA, CXR clear), Libman-Sacks endocarditis, meningitis (no HA, vision changes, or neck stiffness). If blood cultures are positive and a source has not been identified, a TEE may be warranted. Another consideration is an SLE flare, although her rashes are old and she denies any joint pain. - Vancomycin and Zosyn IV - Blood cx, UA, Urine cx pending - 2-view CXR pending - Influenza pending - Trend lactic acid - Anti-DNA Ab - 50 cc/hr NS  SLE with ESRD: MWF dialysis. - Nephrology consult - Azathioprine 50 mg daily - Hydroxychloroquine 400 mg daily  Protein Calorie Malnutrition: Albumin 2.2. Continue  feeding supplement, Creon, B12, folate, and multivitamin.  Alcohol Use Disorder: Unclear when last drink was - CIWA in place - Thiamine, folate, multivitamin  Depression, Weight loss, and Insomnia: Continue prozac, ramelteon, and remeron.  DVT Prophylaxis: Heparin [place] Code status: Full  Dispo: Disposition is deferred at this time, awaiting improvement of current medical problems. Anticipated discharge in approximately 2-3 day(s).   The patient does have a current PCP Shela Leff, MD) and does need an [facility] hospital follow-up appointment after discharge.  The patient does have transportation limitations that hinder  transportation to clinic appointments.  Signed: Liberty Handy, MD 06/30/2015, 5:35 PM

## 2015-06-30 NOTE — Progress Notes (Signed)
Pharmacy Code Sepsis Protocol  Time of code sepsis page: [id number] [x]  Antibiotics delivered at 1245 []  Antibiotics administered prior to code at  (if checked, omit next 2 questions)  Were antibiotics ordered at the time of the code sepsis page? Yes Was it required to contact the physician? [x]  Physician not contacted []  Physician contacted to order antibiotics for code sepsis []  Physician contacted to recommend changing antibiotics  Pharmacy consulted for: vancomycin and zosyn  Anti-infectives    Start     Dose/Rate Route Frequency Ordered Stop   07/02/15 1200  vancomycin (VANCOCIN) 500 mg in sodium chloride 0.9 % 100 mL IVPB     500 mg 100 mL/hr over 60 Minutes Intravenous Every M-W-F (Hemodialysis) 06/30/15 1310     06/30/15 2200  piperacillin-tazobactam (ZOSYN) IVPB 2.25 g     2.25 g 100 mL/hr over 30 Minutes Intravenous 3 times per day 06/30/15 1310     06/30/15 1245  piperacillin-tazobactam (ZOSYN) IVPB 3.375 g     3.375 g 100 mL/hr over 30 Minutes Intravenous  Once 06/30/15 1236     06/30/15 1245  vancomycin (VANCOCIN) IVPB 1000 mg/200 mL premix     1,000 mg 200 mL/hr over 60 Minutes Intravenous  Once 06/30/15 1236          Nurse education provided: [x]  Minutes left to administer antibiotics to achieve 1 hour goal [x]  Correct order of antibiotic administration [x]  Antibiotic Y-site compatibilities     Nyan Dufresne, Rande Lawman, PharmD 06/30/2015, 1:14 PM

## 2015-06-30 NOTE — ED Notes (Signed)
GEMS from dialysis - did not receive treatment, c/o fever, chills, vomiting onset today, no CP, c/o SOB, temp 102, HR 120 per EMS,

## 2015-06-30 NOTE — ED Notes (Signed)
Attempted bladder scan on patient.  <75mls found.  Will not catheterize at this time.  Admitting aware patient has not been able to urinate.

## 2015-06-30 NOTE — ED Provider Notes (Signed)
CSN: [id number]     Arrival date & time 06/30/15  1214 History   First MD Initiated Contact with Patient 06/30/15 1233     Chief Complaint  Patient presents with  . Fever     (Consider location/radiation/quality/duration/timing/severity/associated sxs/prior Treatment) HPI Comments: Fever and chills since 7AM Felt ok last night Nausea, 3 episodes of emesis Severe chills Denies other symptoms    Patient is a 60 y.o. female presenting with fever.  Fever Associated symptoms: chills, nausea and vomiting   Associated symptoms: no chest pain, no cough, no diarrhea, no dysuria, no headaches, no rash and no sore throat   Risk factors: no sick contacts   Risk factors comment:  Dialysis   Past Medical History  Diagnosis Date  . Anemia, B12 deficiency   . History of acute pancreatitis   . Right knee pain     No recent imaging on chart  . 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 Dr. Mancel Bale, [facility]  . Hypertriglyceridemia   . GERD (gastroesophageal reflux disease)   . Subdural hematoma ([place]) 02/2008    Likely 2/2 trauma from seizure from EtOH withdrawal, chronic in nature, sees Dr. Jerene Bears. Most recent CT head 10/2009 showing stable but persistent hematoma without mass effect.  . History of seizure disorder     Likely 2/2 alcohol abuse  . Hypocalcemia   . Hypomagnesemia   . Failure to thrive in childhood     Unclear etiology  . HTN (hypertension)   . Thrombocytopenia ([place])   . Hepatomegaly     On exam  . Joint pain   . Alcohol abuse   . Vitamin D deficiency   . Pancreatitis   . Insomnia   . Hyperlipidemia   . Pernicious anemia   . Macrocytic anemia   . 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 Depression   . Fx humeral neck 04/17/2011    Transverse fracture- minimally displaced- managed as outpatient   . ABNORMAL PAP SMEAR, LGSIL 07/23/2008    Annotation: HPV positive CIN I Dr. Mancel Bale, [facility] Qualifier: Diagnosis of  By: Oretha Ellis    . Pneumonia 05/20/2012  . Arthritis     "shoulders" (08/15/2013)  . 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  Marland Kitchen Chronic diastolic CHF (congestive heart failure) ([place])   . Hypomagnesemia   . Seizures ([place])     "don't know when/why I had them; daughter was always there w/me"  . On home oxygen therapy     "3L; mostly at night" (06/19/2014)  . Shortness of breath dyspnea   . Pleural effusion    Past Surgical History  Procedure Laterality Date  . Cesarean section  1983  . Esophagogastroduodenoscopy  07/11/2011    Procedure: ESOPHAGOGASTRODUODENOSCOPY (EGD);  Surgeon: Beryle Beams, MD;  Location: [facility];  Service: Endoscopy;  Laterality: N/A;  . Colonoscopy  07/11/2011    Procedure: COLONOSCOPY;  Surgeon: Beryle Beams, MD;  Location: [facility] ENDOSCOPY;  Service: Endoscopy;  Laterality: N/A;  . Eye surgery Left     "trauma"  . Right colectomy  08/28/2011  . Esophagogastroduodenoscopy N/A 12/01/2012    Procedure: ESOPHAGOGASTRODUODENOSCOPY (EGD);  Surgeon: Irene Shipper, MD;  Location: [facility];  Service: Endoscopy;  Laterality: N/A;  . Colonoscopy with esophagogastroduodenoscopy (egd) Left 08/21/2013    Procedure: COLONOSCOPY WITH ESOPHAGOGASTRODUODENOSCOPY (EGD);  Surgeon: Beryle Beams, MD;  Location: [facility] ENDOSCOPY;  Service: Endoscopy;  Laterality: Left;  . Subxyphoid pericardial window N/A 12/04/2013    Procedure: SUBXYPHOID PERICARDIAL WINDOW WITH TEE;  Surgeon: Ivin Poot, MD;  Location: [place];  Service: Thoracic;  Laterality: N/A;  . Intraoperative transesophageal echocardiogram N/A 12/04/2013    Procedure: INTRAOPERATIVE TRANSESOPHAGEAL ECHOCARDIOGRAM;  Surgeon: Ivin Poot, MD;  Location: [employer];  Service: Open Heart Surgery;  Laterality: N/A;  . Exchange of a dialysis catheter Left 10/22/2014    Procedure: EXCHANGE OF A DIALYSIS CATHETER;  Surgeon: Conrad Dante, MD;  Location: [place];   Service: Vascular;  Laterality: Left;  . Av fistula placement Left 12/03/2014    Procedure: INSERTION OF ARTERIOVENOUS (AV) GORE-TEX GRAFT ARM;  Surgeon: Mal Misty, MD;  Location: [facility] OR;  Service: Vascular;  Laterality: Left;   Family History  Problem Relation Age of Onset  . Cancer Mother     Died from stomach cancer and "flesh eating rash  . Heart failure Father     Died in 49s from an MI  . Alcohol abuse Sister     Twin sister drinks a lot, as did both her parents and brothers  . Stroke Brother     Has 7 brothers, 1 with CVA  . Lupus Mother    Social History  Substance Use Topics  . Smoking status: Former Smoker -- 0.50 packs/day for 40 years    Types: Cigarettes    Quit date: 09/20/2010  . Smokeless tobacco: Never Used  . Alcohol Use: 0.0 oz/week    0 Standard drinks or equivalent per week     Comment: 12/02/14 "graduated from [employer] (for alcohol abuse) in October 2015"    OB History    No data available     Review of Systems  Constitutional: Positive for fever and chills.  HENT: Negative for sore throat.   Eyes: Negative for visual disturbance.  Respiratory: Negative for cough and shortness of breath.   Cardiovascular: Negative for chest pain.  Gastrointestinal: Positive for nausea and vomiting. Negative for abdominal pain, diarrhea and constipation.  Genitourinary: Negative for dysuria, vaginal bleeding, vaginal discharge and difficulty urinating.  Musculoskeletal: Negative for back pain and neck pain.  Skin: Negative for rash.  Neurological: Negative for syncope and headaches.      Allergies  Amitriptyline hcl and Doxycycline hyclate  Home Medications   Prior to Admission medications   Medication Sig Start Date End Date Taking? Authorizing Provider  albuterol (PROAIR HFA) 108 (90 BASE) MCG/ACT inhaler Inhale 1-2 puffs into the lungs every 6 (six) hours as needed for wheezing or shortness of breath. 01/14/14  Yes Jones Bales, MD   azaTHIOprine (IMURAN) 50 MG tablet Take 50 mg by mouth daily. 06/29/15  Yes Historical Provider, MD  calcium carbonate (TUMS - DOSED IN MG ELEMENTAL CALCIUM) 500 MG chewable tablet Chew 1,000 tablets by mouth at bedtime.   Yes Historical Provider, MD  camphor-menthol Timoteo Ace) lotion Apply 1 application topically daily as needed for itching.   Yes Historical Provider, MD  cetirizine (ZYRTEC) 10 MG tablet Take 1 tablet (10 mg total) by mouth daily as needed for allergies. 06/11/15  Yes Shela Leff, MD  CREON 12000 UNITS CPEP capsule Take 1 capsule (12,000 Units total) by mouth 3 (three) times daily before meals. 01/21/15  Yes Shela Leff, MD  FLUoxetine (PROZAC) 10 MG capsule Take 1 capsule (10 mg total) by mouth daily. For depression  01/21/15  Yes Shela Leff, MD  folic acid (FOLVITE) 1 MG tablet Take 1 tablet (1 mg total) by mouth daily. For folic acid replacement 06/08/15  Yes Shela Leff, MD  gabapentin (NEURONTIN) 300 MG capsule Take 1 capsule (300 mg total) by mouth daily. 04/09/15 04/06/16 Yes Shela Leff, MD  hydroxychloroquine (PLAQUENIL) 200 MG tablet Take 400 mg by mouth daily.   Yes Historical Provider, MD  Lactobacillus (ACIDOPHILUS PROBIOTIC PO) Take 2 mg by mouth 3 (three) times daily. Take 2 (two) tablets by mouth three times daily   Yes Historical Provider, MD  loperamide (IMODIUM) 2 MG capsule Take 2 mg by mouth every 6 (six) hours as needed for diarrhea or loose stools.   Yes Historical Provider, MD  mirtazapine (REMERON) 7.5 MG tablet Take 1 tablet (7.5 mg total) by mouth at bedtime. 01/21/15  Yes Shela Leff, MD  Multiple Vitamin (MULTIVITAMIN WITH MINERALS) TABS tablet Take 1 tablet by mouth daily. For vitamin replacement 03/24/15  Yes Shela Leff, MD  omeprazole (PRILOSEC) 40 MG capsule Take 1 capsule (40 mg total) by mouth daily. 01/21/15  Yes Shela Leff, MD  ramelteon (ROZEREM) 8 MG tablet Take 1 tablet (8 mg total) by mouth at bedtime.  01/21/15  Yes Shela Leff, MD  thiamine (VITAMIN B-1) 100 MG tablet Take 1 tablet (100 mg total) by mouth daily. For low thiamine 01/21/15  Yes Shela Leff, MD  vitamin B-12 (CYANOCOBALAMIN) 250 MCG tablet Take 1 tablet (250 mcg total) by mouth every evening. 01/21/15  Yes Shela Leff, MD   BP 136/84 mmHg  Pulse 100  Temp(Src) 99.2 F (37.3 C) (Oral)  Resp 22  Ht 5\' 1"  (1.549 m)  Wt 82 lb 0.2 oz (37.2 kg)  BMI 15.50 kg/m2  SpO2 100% Physical Exam  Constitutional: She is oriented to person, place, and time. She appears well-developed and well-nourished. No distress.  HENT:  Head: Normocephalic and atraumatic.  Eyes: Conjunctivae and EOM are normal. Pupils are equal, round, and reactive to light. Right eye exhibits no discharge. Left eye exhibits no discharge.  Neck: Normal range of motion.  Cardiovascular: Regular rhythm, normal heart sounds and intact distal pulses.  Tachycardia present.  Exam reveals no gallop and no friction rub.   No murmur heard. Pulmonary/Chest: Effort normal and breath sounds normal. No respiratory distress. She has no wheezes. She has no rales.  Abdominal: Soft. She exhibits no distension. There is no tenderness. There is no guarding.  Musculoskeletal: She exhibits no edema or tenderness.  Neurological: She is alert and oriented to person, place, and time.  Skin: Skin is warm and dry. No rash noted. She is not diaphoretic. No erythema.  Nursing note and vitals reviewed.   ED Course  Procedures (including critical care time) Labs Review Labs Reviewed  COMPREHENSIVE METABOLIC PANEL - Abnormal; Notable for the following:    Sodium 131 (*)    Chloride 97 (*)    CO2 21 (*)    Glucose, Bld 182 (*)    Creatinine, Ser 3.89 (*)    Calcium 7.8 (*)    Total Protein 8.6 (*)    Albumin 2.2 (*)    AST 61 (*)    Alkaline Phosphatase 365 (*)    GFR calc non Af Amer 12 (*)    GFR calc Af Amer 14 (*)    All other components within normal limits  CBC  WITH DIFFERENTIAL/PLATELET - Abnormal; Notable for the following:    WBC 3.8 (*)  RBC 3.71 (*)    MCV 101.9 (*)    RDW 16.5 (*)    Platelets 106 (*)    Lymphs Abs 0.3 (*)    All other components within normal limits  I-STAT CG4 LACTIC ACID, ED - Abnormal; Notable for the following:    Lactic Acid, Venous 4.48 (*)    All other components within normal limits  CBG MONITORING, ED - Abnormal; Notable for the following:    Glucose-Capillary 170 (*)    All other components within normal limits  I-STAT CG4 LACTIC ACID, ED - Abnormal; Notable for the following:    Lactic Acid, Venous 2.55 (*)    All other components within normal limits  I-STAT CHEM 8, ED - Abnormal; Notable for the following:    Sodium 131 (*)    Chloride 95 (*)    Creatinine, Ser 3.90 (*)    Glucose, Bld 188 (*)    Calcium, Ion 0.92 (*)    All other components within normal limits  I-STAT CG4 LACTIC ACID, ED - Abnormal; Notable for the following:    Lactic Acid, Venous 2.58 (*)    All other components within normal limits  CULTURE, BLOOD (ROUTINE X 2)  CULTURE, BLOOD (ROUTINE X 2)  URINE CULTURE  URINALYSIS, ROUTINE W REFLEX MICROSCOPIC (NOT AT [facility])  I-STAT CG4 LACTIC ACID, ED    Imaging Review Dg Chest Port 1 View  06/30/2015  CLINICAL DATA:  Patient is febrile with chills and vomiting. EXAM: PORTABLE CHEST 1 VIEW COMPARISON:  12/06/2014 FINDINGS: Cardiomediastinal silhouette is normal. Mediastinal contours appear intact. There is no evidence of focal airspace consolidation, pleural effusion or pneumothorax. Osseous structures are without acute abnormality. Soft tissues are grossly normal. IMPRESSION: No active disease. Electronically Signed   By: Fidela Salisbury M.D.   On: 06/30/2015 13:19   I have personally reviewed and evaluated these images and lab results as part of my medical decision-making.   EKG Interpretation   Date/Time:  Wednesday June 30 2015 12:26:57 EST Ventricular Rate:  116 PR  Interval:  158 QRS Duration: 82 QT Interval:  355 QTC Calculation: 493 R Axis:   87 Text Interpretation:  Sinus tachycardia Probable LVH with secondary repol  abnrm Borderline prolonged QT interval TW abnormality anterior leads new  from prior No other significant changes Confirmed by [facility] MD, Mackenzy Grumbine  ([id number]) on 06/30/2015 6:50:35 PM       CRITICAL CARE: sepsis, elevated lactic acid Performed by: Alvino Chapel   Total critical care time: 30 minutes  Critical care time was exclusive of separately billable procedures and treating other patients.  Critical care was necessary to treat or prevent imminent or life-threatening deterioration.  Critical care was time spent personally by me on the following activities: development of treatment plan with patient and/or surrogate as well as nursing, discussions with consultants, evaluation of patient's response to treatment, examination of patient, obtaining history from patient or surrogate, ordering and performing treatments and interventions, ordering and review of laboratory studies, ordering and review of radiographic studies, pulse oximetry and re-evaluation of patient's condition.  MDM   Final diagnoses:  None   60 year old female with a history of ESRD on dialysis, hypertension, lupus, focal segmental glomerulosclerosis, alcohol dependence who presents with concern for fever, chills, nausea and vomiting from dialysis.  Code sepsis initiated on patient's arrival given febrile to 103, tachycardic to the 120s and patient patient given vancomycin and Zosyn for sepsis of unknown source in dialysis pt.  patient with  normal mentation, electrolytes and creatinine at baseline. X-ray shows no sign of pneumonia. Patient reports she does make urine, and consider UTI as possible etiology, however pt unable to provide sample at this time.  Pt given 1L initially which is 30/kg given her weight.  Lactic acid elevated to 4.48 and decreased to  2.5 on recheck. Pt admitted to Internal Medicine for further care.    Gareth Morgan, MD 06/30/15 [phone]

## 2015-07-01 ENCOUNTER — Inpatient Hospital Stay (HOSPITAL_COMMUNITY): Payer: Medicare Other

## 2015-07-01 DIAGNOSIS — E43 Unspecified severe protein-calorie malnutrition: Secondary | ICD-10-CM

## 2015-07-01 LAB — RENAL FUNCTION PANEL
ALBUMIN: 1.6 g/dL — AB (ref 3.5–5.0)
Anion gap: 9 (ref 5–15)
BUN: 11 mg/dL (ref 6–20)
CALCIUM: 6.8 mg/dL — AB (ref 8.9–10.3)
CO2: 20 mmol/L — ABNORMAL LOW (ref 22–32)
CREATININE: 4.16 mg/dL — AB (ref 0.44–1.00)
Chloride: 105 mmol/L (ref 101–111)
GFR calc Af Amer: 13 mL/min — ABNORMAL LOW (ref >60)
GFR, EST NON AFRICAN AMERICAN: 11 mL/min — AB (ref >60)
Glucose, Bld: 119 mg/dL — ABNORMAL HIGH (ref 65–99)
PHOSPHORUS: 2.6 mg/dL (ref 2.5–4.6)
Potassium: 3.7 mmol/L (ref 3.5–5.1)
SODIUM: 134 mmol/L — AB (ref 135–145)

## 2015-07-01 LAB — URINE CULTURE

## 2015-07-01 LAB — CBC
HCT: 30.1 % — ABNORMAL LOW (ref 36.0–46.0)
Hemoglobin: 9.7 g/dL — ABNORMAL LOW (ref 12.0–15.0)
MCH: 33 pg (ref 26.0–34.0)
MCHC: 32.2 g/dL (ref 30.0–36.0)
MCV: 102.4 fL — ABNORMAL HIGH (ref 78.0–100.0)
PLATELETS: 81 10*3/uL — AB (ref 150–400)
RBC: 2.94 MIL/uL — ABNORMAL LOW (ref 3.87–5.11)
RDW: 17.3 % — AB (ref 11.5–15.5)
WBC: 3.4 10*3/uL — AB (ref 4.0–10.5)

## 2015-07-01 LAB — INFLUENZA PANEL BY PCR (TYPE A & B)
H1N1FLUPCR: NOT DETECTED
INFLBPCR: NEGATIVE
Influenza A By PCR: NEGATIVE

## 2015-07-01 LAB — LACTIC ACID, PLASMA
Lactic Acid, Venous: 1.7 mmol/L (ref 0.5–2.0)
Lactic Acid, Venous: 1.1 mmol/L (ref 0.5–2.0)

## 2015-07-01 LAB — MRSA PCR SCREENING: MRSA by PCR: NEGATIVE

## 2015-07-01 MED ORDER — SODIUM CHLORIDE 0.9 % IV SOLN: 100.0000 mL | INTRAVENOUS | Status: DC | PRN

## 2015-07-01 MED ORDER — ALTEPLASE 2 MG IJ SOLR
2.0000 mg | Freq: Once | INTRAMUSCULAR | Status: DC | PRN
  Filled 2015-07-01: qty 2

## 2015-07-01 MED ORDER — LIDOCAINE HCL (PF) 1 % IJ SOLN: 5.0000 mL | INTRAMUSCULAR | Status: DC | PRN

## 2015-07-01 MED ORDER — HEPARIN SODIUM (PORCINE) 1000 UNIT/ML DIALYSIS: 2000.0000 [IU] | Freq: Once | INTRAMUSCULAR | Status: DC

## 2015-07-01 MED ORDER — ENSURE ENLIVE PO LIQD
237.0000 mL | Freq: Two times a day (BID) | ORAL | Status: DC
  Administered 2015-07-01 – 2015-07-05 (×4): 237 mL via ORAL

## 2015-07-01 MED ORDER — LIDOCAINE-PRILOCAINE 2.5-2.5 % EX CREA
1.0000 "application " | TOPICAL_CREAM | CUTANEOUS | Status: DC | PRN
  Filled 2015-07-01: qty 5

## 2015-07-01 MED ORDER — PENTAFLUOROPROP-TETRAFLUOROETH EX AERO: 1.0000 "application " | INHALATION_SPRAY | CUTANEOUS | Status: DC | PRN

## 2015-07-01 MED ORDER — ACETAMINOPHEN 325 MG PO TABS
650.0000 mg | ORAL_TABLET | Freq: Four times a day (QID) | ORAL | Status: DC | PRN
  Administered 2015-07-01 – 2015-07-03 (×4): 650 mg via ORAL
  Filled 2015-07-01 (×3): qty 2

## 2015-07-01 MED ORDER — HEPARIN SODIUM (PORCINE) 1000 UNIT/ML DIALYSIS: 1000.0000 [IU] | INTRAMUSCULAR | Status: DC | PRN

## 2015-07-01 NOTE — Progress Notes (Signed)
Utilization review completed. Charon Akamine, RN, BSN. 

## 2015-07-01 NOTE — Progress Notes (Signed)
Initial Nutrition Assessment  DOCUMENTATION CODES:   Severe malnutrition in context of chronic illness, Underweight  INTERVENTION:  Discontinue Nepro Shake.  Provide Ensure Enlive po BID, each supplement provides 350 kcal and 20 grams of protein.  Encourage adequate PO intake .  NUTRITION DIAGNOSIS:   Malnutrition related to chronic illness as evidenced by severe depletion of body fat, severe depletion of muscle mass.  GOAL:   Patient will meet greater than or equal to 90% of their needs  MONITOR:   PO intake, Supplement acceptance, Weight trends, Labs, I & O's  REASON FOR ASSESSMENT:   Malnutrition Screening Tool    ASSESSMENT:   60 year old with a past medical history of SLE with FSGS on dialysis (MWF, [place]), malnutrition, alcohol use disorder, and depression who presents with weakness and nausea.  Meal completion has been 90%. Pt reports appetite is fine currently and PTA at home with consumption of at least 3 meals a day with Ensure TID. Weight has been stable. Pt currently has Nepro Shake ordered and has been refusing them as she dislikes them. She reports she rather have Ensure. RD to modify orders. Pt was encouraged to eat her food at meals.   Nutrition-Focused physical exam completed. Findings are severe fat depletion, severe muscle depletion, and no edema.   Labs and medications reviewed.   Diet Order:  Diet renal with fluid restriction Fluid restriction:: 1200 mL Fluid; Room service appropriate?: Yes; Fluid consistency:: Thin  Skin:  Reviewed, no issues  Last BM:  1/25  Height:   Ht Readings from Last 1 Encounters:  06/30/15 5\' 1"  (1.549 m)    Weight:   Wt Readings from Last 1 Encounters:  07/01/15 82 lb 0.2 oz (37.2 kg)    Ideal Body Weight:  47.7 kg  BMI:  Body mass index is 15.5 kg/(m^2).  Estimated Nutritional Needs:   Kcal:  1400-1600  Protein:  55-65 grams  Fluid:  1.2 L/day  EDUCATION NEEDS:   No education needs  identified at this time  Corrin Parker, MS, RD, LDN Pager # [phone] After hours/ weekend pager # [phone]

## 2015-07-01 NOTE — Progress Notes (Signed)
Subjective:  Patient was seen while on HD. Patient feeling much better today. No nausea or weakness. No fevers. No dysuria.  Objective: Vital signs in last 24 hours: Filed Vitals:   07/01/15 1130 07/01/15 1200 07/01/15 1207 07/01/15 1253  BP: _0 96/61  Pulse: 86 83 84 84  Temp:   98 F (36.7 C) 98.8 F (37.1 C)  TempSrc:   Oral Oral  Resp: _1 Height:      Weight:   82 lb 0.2 oz (37.2 kg)   SpO2:   100% 100%   Weight change:   Intake/Output Summary (Last 24 hours) at 07/01/15 1336 Last data filed at 07/01/15 1207  Gross per 24 hour  Intake  827.5 ml  Output      0 ml  Net  827.5 ml   Physical Exam General: This appearing. Lying in bed, NAD HEENT: Hair loss noted. NCAT, sclerae anicteric Cardiovascular: Continuous murmur. Regular rhythm, no rubs. Palpable thrill on left AVF, nontender to palpation Pulmonary: CTAB, unlabored breathing Abdominal: Soft, NT/ND Extremities: No clubbing, cyanosis, or edema Psychiatric: Normal behavior and affect  Lab Results: Basic Metabolic Panel:  Recent Labs Lab 06/30/15 1320 07/01/15 0922  NA 131* 134*  K 3.9 3.7  CL 97* 105  CO2 21* 20*  GLUCOSE 182* 119*  BUN 10 11  CREATININE 3.89* 4.16*  CALCIUM 7.8* 6.8*  PHOS  --  2.6   Liver Function Tests:  Recent Labs Lab 06/30/15 1320 07/01/15 0922  AST 61*  --   ALT 16  --   ALKPHOS 365*  --   BILITOT 1.2  --   PROT 8.6*  --   ALBUMIN 2.2* 1.6*   CBC:  Recent Labs Lab 06/30/15 1320 07/01/15 0922  WBC 3.8* 3.4*  NEUTROABS 2.9  --   HGB 12.4 9.7*  HCT 37.8 30.1*  MCV 101.9* 102.4*  PLT 106* 81*   CBG:  Recent Labs Lab 06/30/15 1230  GLUCAP 170*   Urinalysis:  Recent Labs Lab 06/30/15 [place] 1.013  PHURINE 7.5  GLUCOSEU NEGATIVE  HGBUR MODERATE*  [place] >300*  NITRITE POSITIVE*  LEUKOCYTESUR TRACE*    Micro Results: Recent Results (from the  past 240 hour(s))  Urine culture     Status: None (Preliminary result)   Collection Time: 06/30/15  7:09 PM  Result Value Ref Range Status   Specimen Description URINE, RANDOM  Final   Special Requests NONE  Final   Culture TOO YOUNG TO READ  Final   Report Status PENDING  Incomplete  MRSA PCR Screening     Status: None   Collection Time: 07/01/15 12:30 AM  Result Value Ref Range Status   MRSA by PCR NEGATIVE NEGATIVE Final    Comment:        The GeneXpert MRSA Assay (FDA approved for NASAL specimens only), is one component of a comprehensive MRSA colonization surveillance program. It is not intended to diagnose MRSA infection nor to guide or monitor treatment for MRSA infections.    Studies/Results: X-ray Chest Pa And Lateral  07/01/2015  CLINICAL DATA:  Fever an nausea. EXAM: CHEST  2 VIEW COMPARISON:  June 30, 2015 FINDINGS: No pneumothorax. No change in mild cardiomegaly. The hila and mediastinum are unchanged. Mild interstitial prominence without overt edema. Tiny pleural effusions with blunting of the costophrenic angles on the lateral view. No other interval changes. IMPRESSION: Mild prominence of  the interstitium could represent pulmonary venous congestion with tiny pleural effusions seen on the lateral view. No focal infiltrate. Electronically Signed   By: Dorise Bullion III M.D   On: 07/01/2015 07:42   Dg Chest Port 1 View  06/30/2015  CLINICAL DATA:  Patient is febrile with chills and vomiting. EXAM: PORTABLE CHEST 1 VIEW COMPARISON:  12/06/2014 FINDINGS: Cardiomediastinal silhouette is normal. Mediastinal contours appear intact. There is no evidence of focal airspace consolidation, pleural effusion or pneumothorax. Osseous structures are without acute abnormality. Soft tissues are grossly normal. IMPRESSION: No active disease. Electronically Signed   By: Fidela Salisbury M.D.   On: 06/30/2015 13:19   Medications: I have reviewed the patient's current  medications. Scheduled Meds: . azaTHIOprine  50 mg Oral Daily  . feeding supplement (NEPRO CARB STEADY)  237 mL Oral BID BM  . FLUoxetine  10 mg Oral Daily  . folic acid  1 mg Oral Daily  . gabapentin  300 mg Oral Daily  . heparin  5,000 Units Subcutaneous 3 times per day  . hydroxychloroquine  400 mg Oral Daily  . lipase/protease/amylase  12,000 Units Oral TID AC  . mirtazapine  7.5 mg Oral QHS  . pantoprazole  40 mg Oral Daily  . piperacillin-tazobactam (ZOSYN)  IV  2.25 g Intravenous 3 times per day  . ramelteon  8 mg Oral QHS  . sodium chloride flush  3 mL Intravenous Q12H  . thiamine  100 mg Oral Daily  . [START ON 07/02/2015] vancomycin  500 mg Intravenous Q M,W,F-HD  . vitamin B-12  250 mcg Oral QPM   Continuous Infusions:  PRN Meds:.albuterol, LORazepam **OR** LORazepam Assessment/Plan:  Systemic Inflammatory Response Syndrome: Patient met 4/4 SIRS criteria on admission, but has been afebrile on broad spectrum antibiotics. Symptomatically feels much better. Lactic acid downtrending. No clear source of infection based on symptomatology. UA nitrite, leuk positive but rare bacteria and 6-30 squames. Considerations are an AVF infection (sx did not begin with dialysis, no AVF site tenderness or erythema), UTI,  If blood cultures are positive and a source has not been identified, a TEE may be warranted. - Vancomycin and Zosyn IV - Blood cx, UA, Urine cx pending - Anti-DNA Ab  SLE with ESRD: MWF dialysis. - Nephrology consult - Azathioprine 50 mg daily - Hydroxychloroquine 400 mg daily  Protein Calorie Malnutrition: Albumin 1.6. Continue feeding supplement, Creon, B12, folate, and multivitamin.  Alcohol Use Disorder: Unclear when last drink was - CIWA in place - Thiamine, folate, multivitamin  Depression, Weight loss, and Insomnia: Continue prozac, ramelteon, and remeron.  DVT Prophylaxis: Heparin [place]  Dispo: Disposition is deferred at this time, awaiting improvement of  current medical problems.  Anticipated discharge in approximately 1-2 day(s).   The patient does have a current PCP Shela Leff, MD) and does need an [facility] hospital follow-up appointment after discharge.  The patient does have transportation limitations that hinder transportation to clinic appointments.  .Services Needed at time of discharge: Y = Yes, Blank = No PT:   OT:   RN:   Equipment:   Other:     LOS: 1 day   Liberty Handy, MD 07/01/2015, 1:36 PM

## 2015-07-01 NOTE — Progress Notes (Signed)
New Admission Note:  Arrival Method: via stretcher with nurse Tech Mental Orientation: Alert&orientedx4 Telemetry:box 21, CCMD notified Assessment: Completed Skin: see docflowsheet IV:  Right wrist, infusing Pain: denies any pain Tubes: Safety Measures: Safety Fall Prevention Plan was given and discussed. Admission: Completed [address] [place] Orientation: Patient has been orientated to the room, unit and the staff. Family: none at bedside  Orders have been reviewed and implemented. Will continue to monitor the patient. Call light has been placed within reach and bed alarm has been activated.   Leandro Reasoner BSN, RN  Phone Number: [phone] [facility]

## 2015-07-01 NOTE — Progress Notes (Signed)
  [facility] Progress Note   Subjective: feeling better, tmax 104 deg and down to 99's today   Filed Vitals:   07/01/15 0907 07/01/15 0912 07/01/15 0930 07/01/15 1000  BP: 93/58 106/66 104/66 84/56  Pulse: 90 84 82 80  Temp: 98 F (36.7 C)     TempSrc: Oral     Resp: 21 20 22 20   Height:      Weight: 37.2 kg (82 lb 0.2 oz)     SpO2: 100%       Inpatient medications: . azaTHIOprine  50 mg Oral Daily  . feeding supplement (NEPRO CARB STEADY)  237 mL Oral BID BM  . FLUoxetine  10 mg Oral Daily  . folic acid  1 mg Oral Daily  . gabapentin  300 mg Oral Daily  . heparin  2,000 Units Dialysis Once in dialysis  . heparin  5,000 Units Subcutaneous 3 times per day  . hydroxychloroquine  400 mg Oral Daily  . lipase/protease/amylase  12,000 Units Oral TID AC  . mirtazapine  7.5 mg Oral QHS  . pantoprazole  40 mg Oral Daily  . piperacillin-tazobactam (ZOSYN)  IV  2.25 g Intravenous 3 times per day  . ramelteon  8 mg Oral QHS  . sodium chloride flush  3 mL Intravenous Q12H  . thiamine  100 mg Oral Daily  . [START ON 07/02/2015] vancomycin  500 mg Intravenous Q M,W,F-HD  . vitamin B-12  250 mcg Oral QPM     sodium chloride, sodium chloride, albuterol, alteplase, heparin, lidocaine (PF), lidocaine-prilocaine, LORazepam **OR** LORazepam, pentafluoroprop-tetrafluoroeth  Exam: Alert no distress, calm No jvd Chest mostly clear bilat , occ crackles scattered RRR no mrg Abd soft ntnd +bs Ext no wounds or ulcers   [facility]  4h   F160   37.5kg  4K/2.25 bath  LUA AVG Heparin 2000 Mircera 100 q 2, last 1/18 Calcitriol 1.25 ug tiw BP's run high     Assessment: 1 Fever - high fever 104, down today. No blood cx's drawn at OP HD. Cx's here pending. IV abx 2 ESRD HD mwf, missed yest 3 Vol is at dry wt 4 SLE on plaquenil/ imuran 5 Poss etoh abuse 6 Anemia on esa 7 HTN - bp's low here , not on any bp meds at home. BP's at HD normal to high 8 MBD binders/ D  Plan - HD  today and again tomorrow. Keep even.    Kelly Splinter MD [facility] pager [phone]    cell [phone] 07/01/2015, 10:08 AM    Recent Labs Lab 06/30/15 1303 06/30/15 1320  NA 131* 131*  K 4.3 3.9  CL 95* 97*  CO2  --  21*  GLUCOSE 188* 182*  BUN 11 10  CREATININE 3.90* 3.89*  CALCIUM  --  7.8*    Recent Labs Lab 06/30/15 1320  AST 61*  ALT 16  ALKPHOS 365*  BILITOT 1.2  PROT 8.6*  ALBUMIN 2.2*    Recent Labs Lab 06/30/15 1303 06/30/15 1320 07/01/15 0922  WBC  --  3.8* 3.4*  NEUTROABS  --  2.9  --   HGB 13.6 12.4 9.7*  HCT 40.0 37.8 30.1*  MCV  --  101.9* 102.4*  PLT  --  106* 81*

## 2015-07-02 DIAGNOSIS — R197 Diarrhea, unspecified: Secondary | ICD-10-CM

## 2015-07-02 DIAGNOSIS — D631 Anemia in chronic kidney disease: Secondary | ICD-10-CM

## 2015-07-02 DIAGNOSIS — R509 Fever, unspecified: Principal | ICD-10-CM

## 2015-07-02 LAB — ANTI-DNA ANTIBODY, DOUBLE-STRANDED: ds DNA Ab: 9 IU/mL (ref 0–9)

## 2015-07-02 LAB — CBC
HCT: 33.2 % — ABNORMAL LOW (ref 36.0–46.0)
HEMOGLOBIN: 10.6 g/dL — AB (ref 12.0–15.0)
MCH: 33.5 pg (ref 26.0–34.0)
MCHC: 31.9 g/dL (ref 30.0–36.0)
MCV: 105.1 fL — ABNORMAL HIGH (ref 78.0–100.0)
PLATELETS: 72 10*3/uL — AB (ref 150–400)
RBC: 3.16 MIL/uL — ABNORMAL LOW (ref 3.87–5.11)
RDW: 17.7 % — AB (ref 11.5–15.5)
WBC: 3.6 10*3/uL — ABNORMAL LOW (ref 4.0–10.5)

## 2015-07-02 LAB — C DIFFICILE QUICK SCREEN W PCR REFLEX
C DIFFICLE (CDIFF) ANTIGEN: NEGATIVE
C Diff interpretation: NEGATIVE
C Diff toxin: NEGATIVE

## 2015-07-02 LAB — RENAL FUNCTION PANEL
ALBUMIN: 1.5 g/dL — AB (ref 3.5–5.0)
ANION GAP: 8 (ref 5–15)
BUN: <5 mg/dL — ABNORMAL LOW (ref 6–20)
CALCIUM: 7.1 mg/dL — AB (ref 8.9–10.3)
CO2: 24 mmol/L (ref 22–32)
CREATININE: 2.43 mg/dL — AB (ref 0.44–1.00)
Chloride: 105 mmol/L (ref 101–111)
GFR calc Af Amer: 24 mL/min — ABNORMAL LOW (ref >60)
GFR calc non Af Amer: 21 mL/min — ABNORMAL LOW (ref >60)
GLUCOSE: 85 mg/dL (ref 65–99)
PHOSPHORUS: 1.2 mg/dL — AB (ref 2.5–4.6)
Potassium: 3.8 mmol/L (ref 3.5–5.1)
SODIUM: 137 mmol/L (ref 135–145)

## 2015-07-02 MED ORDER — HEPARIN SODIUM (PORCINE) 1000 UNIT/ML DIALYSIS: 2000.0000 [IU] | Freq: Once | INTRAMUSCULAR | Status: DC

## 2015-07-02 MED ORDER — LIDOCAINE-PRILOCAINE 2.5-2.5 % EX CREA
1.0000 "application " | TOPICAL_CREAM | CUTANEOUS | Status: DC | PRN
  Filled 2015-07-02: qty 5

## 2015-07-02 MED ORDER — PENTAFLUOROPROP-TETRAFLUOROETH EX AERO: 1.0000 "application " | INHALATION_SPRAY | CUTANEOUS | Status: DC | PRN

## 2015-07-02 MED ORDER — ALTEPLASE 2 MG IJ SOLR
2.0000 mg | Freq: Once | INTRAMUSCULAR | Status: DC | PRN
  Filled 2015-07-02: qty 2

## 2015-07-02 MED ORDER — SODIUM CHLORIDE 0.9 % IV SOLN: 100.0000 mL | INTRAVENOUS | Status: DC | PRN

## 2015-07-02 MED ORDER — LIDOCAINE HCL (PF) 1 % IJ SOLN: 5.0000 mL | INTRAMUSCULAR | Status: DC | PRN

## 2015-07-02 MED ORDER — ONDANSETRON HCL 4 MG PO TABS
4.0000 mg | ORAL_TABLET | Freq: Three times a day (TID) | ORAL | Status: DC | PRN
  Administered 2015-07-02 – 2015-07-04 (×2): 4 mg via ORAL
  Filled 2015-07-02 (×2): qty 1

## 2015-07-02 MED ORDER — HEPARIN SODIUM (PORCINE) 1000 UNIT/ML DIALYSIS: 1000.0000 [IU] | INTRAMUSCULAR | Status: DC | PRN

## 2015-07-02 NOTE — Progress Notes (Addendum)
Subjective:  Patient was seen at bedside this AM. Patient complained of two episodes of watery, nonbloody stool similar to episodes she has had prior to her hospitalizaton. She also complained of subjective fever last night with chills and sweats, but she feels better this morning.   Objective: Vital signs in last 24 hours: Filed Vitals:   07/01/15 1253 07/01/15 1604 07/01/15 2127 07/02/15 0539  BP: 96/61 109/64 158/82 120/90  Pulse: 84 88 99 89  Temp: 98.8 F (37.1 C) 99.1 F (37.3 C) 101.3 F (38.5 C) 97.6 F (36.4 C)  TempSrc: Oral Oral Oral Oral  Resp: 18 18 18 16   Height:      Weight:   82 lb 0.2 oz (37.2 kg)   SpO2: 100% 100% 99% 100%   Weight change: 0 lb (0 kg)  Intake/Output Summary (Last 24 hours) at 07/02/15 1345 Last data filed at 07/02/15 0600  Gross per 24 hour  Intake    550 ml  Output      0 ml  Net    550 ml   Physical Exam General: This appearing. Lying in bed, NAD HEENT: Hair loss noted. NCAT, sclerae anicteric Cardiovascular: Continuous murmur. Regular rhythm, no rubs. Palpable thrill on left AVF, nontender to palpation Pulmonary: CTAB, unlabored breathing Abdominal: Mild discomfort to palpation throughout. Normbal bowel sounds. Negative Murphy's sign Extremities: No clubbing, cyanosis, or edema Psychiatric: Normal behavior and affect Skin: Multiple excoriations  Lab Results: Basic Metabolic Panel:  Recent Labs Lab 07/01/15 0922 07/02/15 0543  NA 134* 137  K 3.7 3.8  CL 105 105  CO2 20* 24  GLUCOSE 119* 85  BUN 11 <5*  CREATININE 4.16* 2.43*  CALCIUM 6.8* 7.1*  PHOS 2.6 1.2*   Liver Function Tests:  Recent Labs Lab 06/30/15 1320 07/01/15 0922 07/02/15 0543  AST 61*  --   --   ALT 16  --   --   ALKPHOS 365*  --   --   BILITOT 1.2  --   --   PROT 8.6*  --   --   ALBUMIN 2.2* 1.6* 1.5*   CBC:  Recent Labs Lab 06/30/15 1320 07/01/15 0922 07/02/15 0543  WBC 3.8* 3.4* 3.6*  NEUTROABS 2.9  --   --   HGB 12.4 9.7* 10.6*    HCT 37.8 30.1* 33.2*  MCV 101.9* 102.4* 105.1*  PLT 106* 81* 72*   CBG:  Recent Labs Lab 06/30/15 1230  GLUCAP 170*   Urinalysis:  Recent Labs Lab 06/30/15 [place] 1.013  PHURINE 7.5  GLUCOSEU NEGATIVE  HGBUR MODERATE*  BILIRUBINUR NEGATIVE  KETONESUR NEGATIVE  PROTEINUR >300*  NITRITE POSITIVE*  LEUKOCYTESUR TRACE*    Micro Results: Recent Results (from the past 240 hour(s))  Culture, blood (routine x 2)     Status: None (Preliminary result)   Collection Time: 06/30/15  1:00 PM  Result Value Ref Range Status   Specimen Description BLOOD RIGHT ANTECUBITAL  Final   Special Requests BOTTLES DRAWN AEROBIC AND ANAEROBIC 3CC  Final   Culture NO GROWTH 2 DAYS  Final   Report Status PENDING  Incomplete  Culture, blood (routine x 2)     Status: None (Preliminary result)   Collection Time: 06/30/15  1:20 PM  Result Value Ref Range Status   Specimen Description BLOOD RIGHT ANTECUBITAL  Final   Special Requests BOTTLES DRAWN AEROBIC AND ANAEROBIC 6CC  Final   Culture NO GROWTH 2 DAYS  Final   Report  Status PENDING  Incomplete  Urine culture     Status: None   Collection Time: 06/30/15  7:09 PM  Result Value Ref Range Status   Specimen Description URINE, RANDOM  Final   Special Requests NONE  Final   Culture MULTIPLE SPECIES PRESENT, SUGGEST RECOLLECTION  Final   Report Status 07/01/2015 FINAL  Final  MRSA PCR Screening     Status: None   Collection Time: 07/01/15 12:30 AM  Result Value Ref Range Status   MRSA by PCR NEGATIVE NEGATIVE Final    Comment:        The GeneXpert MRSA Assay (FDA approved for NASAL specimens only), is one component of a comprehensive MRSA colonization surveillance program. It is not intended to diagnose MRSA infection nor to guide or monitor treatment for MRSA infections.    Studies/Results: X-ray Chest Pa And Lateral  07/01/2015  CLINICAL DATA:  Fever an nausea. EXAM: CHEST  2 VIEW COMPARISON:  June 30, 2015 FINDINGS: No pneumothorax. No change in mild cardiomegaly. The hila and mediastinum are unchanged. Mild interstitial prominence without overt edema. Tiny pleural effusions with blunting of the costophrenic angles on the lateral view. No other interval changes. IMPRESSION: Mild prominence of the interstitium could represent pulmonary venous congestion with tiny pleural effusions seen on the lateral view. No focal infiltrate. Electronically Signed   By: Dorise Bullion III M.D   On: 07/01/2015 07:42   Medications: I have reviewed the patient's current medications. Scheduled Meds: . azaTHIOprine  50 mg Oral Daily  . feeding supplement (ENSURE ENLIVE)  237 mL Oral BID BM  . FLUoxetine  10 mg Oral Daily  . folic acid  1 mg Oral Daily  . gabapentin  300 mg Oral Daily  . [START ON 07/03/2015] heparin  2,000 Units Dialysis Once in dialysis  . heparin  5,000 Units Subcutaneous 3 times per day  . hydroxychloroquine  400 mg Oral Daily  . lipase/protease/amylase  12,000 Units Oral TID AC  . mirtazapine  7.5 mg Oral QHS  . pantoprazole  40 mg Oral Daily  . piperacillin-tazobactam (ZOSYN)  IV  2.25 g Intravenous 3 times per day  . ramelteon  8 mg Oral QHS  . sodium chloride flush  3 mL Intravenous Q12H  . thiamine  100 mg Oral Daily  . vancomycin  500 mg Intravenous Q M,W,F-HD  . vitamin B-12  250 mcg Oral QPM   Continuous Infusions:  PRN Meds:.sodium chloride, sodium chloride, acetaminophen, albuterol, alteplase, heparin, lidocaine (PF), lidocaine-prilocaine, LORazepam **OR** LORazepam, ondansetron, pentafluoroprop-tetrafluoroeth Assessment/Plan:  Systemic Inflammatory Response Syndrome: Patient met 4/4 SIRS criteria on admission, but has been afebrile on broad spectrum antibiotics, save for spiking a temperature to 101.3 last night (associated with subjective fever). In light of her gastrointestinal symptoms, will investigate GI infection. Will also contact vascular to query an AVF infection.  -  Vancomycin and Zosyn IV - GI pathogen panel and C. Diff pending - Blood culture no growth x2 days, Urine Cx shows multiple species - Anti-DNA Ab  SLE with ESRD: MWF dialysis. - Nephrology consult - Azathioprine 50 mg daily - Hydroxychloroquine 400 mg daily  Anemia of Chronic Disease: On Aranesp  Protein Calorie Malnutrition: Albumin 1.6. Continue feeding supplement, Creon, B12, folate, and multivitamin.  Alcohol Use Disorder: Unclear when last drink was - CIWA in place - Thiamine, folate, multivitamin  Depression, Weight loss, and Insomnia: Continue prozac, ramelteon, and remeron.  DVT Prophylaxis: Heparin [place]  Dispo: Disposition is deferred at this time,  awaiting improvement of current medical problems.  Anticipated discharge in approximately 1-2 day(s).   The patient does have a current PCP Shela Leff, MD) and does need an [facility] hospital follow-up appointment after discharge.  The patient does have transportation limitations that hinder transportation to clinic appointments.  .Services Needed at time of discharge: Y = Yes, Blank = No PT:   OT:   RN:   Equipment:   Other:     LOS: 2 days   Liberty Handy, MD 07/02/2015, 1:45 PM

## 2015-07-02 NOTE — Progress Notes (Signed)
Subjective:  Co diarrhea / HD today on schedule  Objective Vital signs in last 24 hours: Filed Vitals:   07/02/15 1451 07/02/15 1456 07/02/15 1530 07/02/15 1600  BP: 123/76 125/74 116/74 126/78  Pulse: 73 73 78 80  Temp: 97.1 F (36.2 C)     TempSrc: Oral     Resp: 18     Height:      Weight: 38.5 kg (84 lb 14 oz)     SpO2: 99%      Weight change: 0 kg (0 lb)  Physical Exam: General =Alert , NAD  calm Lungs=CTA bilat  Card=RRR no mrg Abd= soft ntnd +bs Ext =no wounds or ulcers HD access=pos bruit  Lua avg   OP HD=[facility] 4h F160 37.5kg 4K/2.25 bath LUA AVG Heparin 2000 Mircera 100 q 2, last 1/18 Calcitriol 1.25 ug tiw   Problem/Plan: 1 Fever - high fever 104, down today. No blood cx's drawn at OP HD. 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  Kelly Splinter MD [facility] pager [phone]    cell [phone] 07/03/2015, 7:49 AM    Labs: Basic Metabolic Panel:  Recent Labs Lab 06/30/15 1320 07/01/15 0922 07/02/15 0543  NA 131* 134* 137  K 3.9 3.7 3.8  CL 97* 105 105  CO2 21* 20* 24  GLUCOSE 182* 119* 85  BUN 10 11 <5*  CREATININE 3.89* 4.16* 2.43*  CALCIUM 7.8* 6.8* 7.1*  PHOS  --  2.6 1.2*   Liver Function Tests:  Recent Labs Lab 06/30/15 1320 07/01/15 0922 07/02/15 0543  AST 61*  --   --   ALT 16  --   --   ALKPHOS 365*  --   --   BILITOT 1.2  --   --   PROT 8.6*  --   --   ALBUMIN 2.2* 1.6* 1.5*   No results for input(s): LIPASE, AMYLASE in the last 168 hours. No results for input(s): AMMONIA in the last 168 hours. CBC:  Recent  Labs Lab 06/30/15 1320 07/01/15 0922 07/02/15 0543  WBC 3.8* 3.4* 3.6*  NEUTROABS 2.9  --   --   HGB 12.4 9.7* 10.6*  HCT 37.8 30.1* 33.2*  MCV 101.9* 102.4* 105.1*  PLT 106* 81* 72*  CBG:  Recent Labs Lab 06/30/15 1230  GLUCAP 170*  Medications:   . azaTHIOprine  50 mg Oral Daily  . feeding supplement (ENSURE ENLIVE)  237 mL Oral BID BM  . FLUoxetine  10 mg Oral Daily  . folic acid  1 mg Oral Daily  . gabapentin  300 mg Oral Daily  . [START ON 07/03/2015] heparin  2,000 Units Dialysis Once in dialysis  . heparin  5,000 Units Subcutaneous 3 times per day  . hydroxychloroquine  400 mg Oral Daily  . lipase/protease/amylase  12,000 Units Oral TID AC  . mirtazapine  7.5 mg Oral QHS  . pantoprazole  40 mg Oral Daily  . piperacillin-tazobactam (ZOSYN)  IV  2.25 g  Intravenous 3 times per day  . ramelteon  8 mg Oral QHS  . sodium chloride flush  3 mL Intravenous Q12H  . thiamine  100 mg Oral Daily  . vancomycin  500 mg Intravenous Q M,W,F-HD  . vitamin B-12  250 mcg Oral QPM

## 2015-07-03 ENCOUNTER — Encounter (HOSPITAL_COMMUNITY): Payer: Self-pay | Admitting: *Deleted

## 2015-07-03 LAB — GASTROINTESTINAL PANEL BY PCR, STOOL (REPLACES STOOL CULTURE)
Adenovirus F40/41: NOT DETECTED
Astrovirus: NOT DETECTED
CRYPTOSPORIDIUM: NOT DETECTED
CYCLOSPORA CAYETANENSIS: NOT DETECTED
Campylobacter species: NOT DETECTED
E. COLI O157: NOT DETECTED
ENTEROAGGREGATIVE E COLI (EAEC): NOT DETECTED
ENTEROPATHOGENIC E COLI (EPEC): NOT DETECTED
Entamoeba histolytica: NOT DETECTED
Enterotoxigenic E coli (ETEC): NOT DETECTED
Giardia lamblia: NOT DETECTED
Norovirus GI/GII: NOT DETECTED
PLESIMONAS SHIGELLOIDES: NOT DETECTED
ROTAVIRUS A: NOT DETECTED
SAPOVIRUS (I, II, IV, AND V): NOT DETECTED
SHIGA LIKE TOXIN PRODUCING E COLI (STEC): NOT DETECTED
SHIGELLA/ENTEROINVASIVE E COLI (EIEC): NOT DETECTED
Salmonella species: NOT DETECTED
VIBRIO CHOLERAE: NOT DETECTED
Vibrio species: NOT DETECTED
Yersinia enterocolitica: NOT DETECTED

## 2015-07-03 LAB — RENAL FUNCTION PANEL
ANION GAP: 10 (ref 5–15)
Albumin: 1.5 g/dL — ABNORMAL LOW (ref 3.5–5.0)
CALCIUM: 7.6 mg/dL — AB (ref 8.9–10.3)
CO2: 24 mmol/L (ref 22–32)
Chloride: 100 mmol/L — ABNORMAL LOW (ref 101–111)
Creatinine, Ser: 1.94 mg/dL — ABNORMAL HIGH (ref 0.44–1.00)
GFR calc Af Amer: 32 mL/min — ABNORMAL LOW (ref >60)
GFR calc non Af Amer: 27 mL/min — ABNORMAL LOW (ref >60)
GLUCOSE: 100 mg/dL — AB (ref 65–99)
Phosphorus: 1 mg/dL — CL (ref 2.5–4.6)
Potassium: 3.8 mmol/L (ref 3.5–5.1)
SODIUM: 134 mmol/L — AB (ref 135–145)

## 2015-07-03 LAB — CBC
HEMATOCRIT: 32.7 % — AB (ref 36.0–46.0)
HEMOGLOBIN: 10 g/dL — AB (ref 12.0–15.0)
MCH: 32.3 pg (ref 26.0–34.0)
MCHC: 30.6 g/dL (ref 30.0–36.0)
MCV: 105.5 fL — ABNORMAL HIGH (ref 78.0–100.0)
Platelets: 78 10*3/uL — ABNORMAL LOW (ref 150–400)
RBC: 3.1 MIL/uL — ABNORMAL LOW (ref 3.87–5.11)
RDW: 17.5 % — ABNORMAL HIGH (ref 11.5–15.5)
WBC: 3.3 10*3/uL — ABNORMAL LOW (ref 4.0–10.5)

## 2015-07-03 MED ORDER — K PHOS MONO-SOD PHOS DI & MONO 155-852-130 MG PO TABS
500.0000 mg | ORAL_TABLET | Freq: Two times a day (BID) | ORAL | Status: DC
  Administered 2015-07-03 – 2015-07-04 (×3): 500 mg via ORAL
  Filled 2015-07-03 (×5): qty 2

## 2015-07-03 NOTE — Progress Notes (Addendum)
Subjective:  Tolerated HD yesterday on schedule now  Objective Vital signs in last 24 hours: Filed Vitals:   07/02/15 1907 07/02/15 2051 07/03/15 0410 07/03/15 0900  BP: 138/83 136/82 118/73 129/75  Pulse: 83 88 94 82  Temp: 98 F (36.7 C) 100.9 F (38.3 C) 99.3 F (37.4 C) 98.2 F (36.8 C)  TempSrc: Oral Oral Oral Oral  Resp: 20 20 20 18   Height:      Weight:   38 kg (83 lb 12.4 oz)   SpO2: 100% 100% 100% 100%   Weight change: 1.3 kg (2 lb 13.9 oz) Physical Exam: General =Alert , NAD calm Lungs=CTA bilat Card=RRR no mrg Abd= soft nt nd +bs Ext =no wounds or ulcers HD access=pos bruit LUA AVG  OP HD=[facility] 4h F160 37.5kg 4K/2.25 bath LUA AVG Heparin 2000 Mircera 100 q 2, last 1/18 Calcitriol 1.25 ug tiw   Problem/Plan: 1 Fever  (104 deg) - temp yest 101, afeb this am/. No blood cx's drawn at OP HD. Cx's here no growth so far , nl wbc/ admit team wu on IV abx 2 ESRD =HD mwf, now on schedule/ k3.8 3 Vol = Only 1 kg >edw  4 SLE =on plaquenil/ imuran 5 Poss etoh abuse 6 Anemia =on esa 7 HTN = bp 125/74 , no bp meds at home./ BP's at HD normal to high 8 MBD = OFF Binders with PHOS 1.0 will give replacement po / Ca corec = 9.6 on vit d on hd /  9 Nutrition = Alb 1.5 =with malnutrition  And  Low Phos and K ok  Change to Reg. Diet  Monitor labs  Ernest Haber, PA-C [place] [phone] 07/03/2015,10:09 AM  LOS: 3 days   Pt seen, examined, agree w assess/plan as above with additions as indicated.  Kelly Splinter MD [facility] pager [phone]    cell [phone] 07/03/2015, 11:55 AM     Labs: Basic Metabolic Panel:  Recent Labs Lab 07/01/15 0922 07/02/15 0543 07/03/15 0448  NA 134* 137 134*  K 3.7 3.8 3.8  CL 105 105 100*  CO2 20* 24 24  GLUCOSE 119* 85 100*  BUN 11 <5* <5*  CREATININE 4.16* 2.43* 1.94*  CALCIUM 6.8* 7.1* 7.6*  PHOS 2.6 1.2* 1.0*   Liver Function Tests:  Recent Labs Lab  06/30/15 1320 07/01/15 0922 07/02/15 0543 07/03/15 0448  AST 61*  --   --   --   ALT 16  --   --   --   ALKPHOS 365*  --   --   --   BILITOT 1.2  --   --   --   PROT 8.6*  --   --   --   ALBUMIN 2.2* 1.6* 1.5* 1.5*   No results for input(s): LIPASE, AMYLASE in the last 168 hours. No results for input(s): AMMONIA in the last 168 hours. CBC:  Recent Labs Lab 06/30/15 1320 07/01/15 0922 07/02/15 0543 07/03/15 0448  WBC 3.8* 3.4* 3.6* 3.3*  NEUTROABS 2.9  --   --   --   HGB 12.4 9.7* 10.6* 10.0*  HCT 37.8 30.1* 33.2* 32.7*  MCV 101.9* 102.4* 105.1* 105.5*  PLT 106* 81* 72* 78*   CBG:  Recent Labs Lab 06/30/15 1230  GLUCAP 170*    Studies/Results: No results found. Medications:   . azaTHIOprine  50 mg Oral Daily  . feeding supplement (ENSURE ENLIVE)  237 mL Oral BID BM  . FLUoxetine  10  mg Oral Daily  . folic acid  1 mg Oral Daily  . gabapentin  300 mg Oral Daily  . heparin  5,000 Units Subcutaneous 3 times per day  . hydroxychloroquine  400 mg Oral Daily  . lipase/protease/amylase  12,000 Units Oral TID AC  . mirtazapine  7.5 mg Oral QHS  . pantoprazole  40 mg Oral Daily  . piperacillin-tazobactam (ZOSYN)  IV  2.25 g Intravenous 3 times per day  . ramelteon  8 mg Oral QHS  . sodium chloride flush  3 mL Intravenous Q12H  . thiamine  100 mg Oral Daily  . vancomycin  500 mg Intravenous Q M,W,F-HD  . vitamin B-12  250 mcg Oral QPM

## 2015-07-03 NOTE — Consult Note (Signed)
Pharmacy Antibiotic Follow-up Note  [name] [name] is a 60 y.o. year-old female admitted on 06/30/2015.  The patient is currently on day 4 of ? for sepsis.  Assessment/Plan: Pt currently on vanc/zosyn for unknown cause of fevers since admit. Infectious work up has been negative thus far. Continuing to cover with broad spectrum abx d/t fevers. Obtain pre-HD Vanc random on Monday 1/30 - Vanc 500mg  qHD MWF - Zosyn  2.25gm IV Q8H  Temp (24hrs), Avg:98.2 F (36.8 C), Min:96.9 F (36.1 C), Max:100.9 F (38.3 C)   Recent Labs Lab 06/30/15 1320 07/01/15 0922 07/02/15 0543 07/03/15 0448  WBC 3.8* 3.4* 3.6* 3.3*    Recent Labs Lab 06/30/15 1303 06/30/15 1320 07/01/15 0922 07/02/15 0543 07/03/15 0448  CREATININE 3.90* 3.89* 4.16* 2.43* 1.94*   Estimated Creatinine Clearance: 19 mL/min (by C-G formula based on Cr of 1.94).    Allergies  Allergen Reactions  . Amitriptyline Hcl Swelling    In the face.  . Doxycycline Hyclate Itching    Feels like something crawling under her skin    Antimicrobials this admission: Vanc 1/25 >>  Zosyn 1/25 >>    Microbiology results: 1/26 MRSA Screen negative 1/25 Urine cx: negative 1/25 BCx: 2/2 negative x2days 1/27 Cdiff negative 1/27 GI PCR:   Thank you for allowing pharmacy to be a part of this patient's care.  Joya San, PharmD Clinical Pharmacy Resident Pager # [phone] 07/03/2015 8:05 AM

## 2015-07-03 NOTE — Progress Notes (Signed)
Subjective:  Pt has no complaints this morning. Reports having a touch of diarrhea since admission. She had a fever of 100.91F yesterday.   Objective: Vital signs in last 24 hours: Filed Vitals:   07/02/15 1808 07/02/15 1907 07/02/15 2051 07/03/15 0410  BP: 143/83 138/83 136/82 118/73  Pulse: 81 83 88 94  Temp: 96.9 F (36.1 C) 98 F (36.7 C) 100.9 F (38.3 C) 99.3 F (37.4 C)  TempSrc: Oral Oral Oral Oral  Resp: 21 20 20 20   Height:      Weight: 85 lb 1.6 oz (38.6 kg)   83 lb 12.4 oz (38 kg)  SpO2: 99% 100% 100% 100%   Weight change: 2 lb 13.9 oz (1.3 kg)  Intake/Output Summary (Last 24 hours) at 07/03/15 0945 Last data filed at 07/03/15 0600  Gross per 24 hour  Intake    530 ml  Output      0 ml  Net    530 ml   Physical Exam General: This appearing. Lying in bed, NAD HEENT: Hair loss noted. NCAT, sclerae anicteric Cardiovascular: Continuous murmur. Regular rhythm, no rubs.  Pulmonary: CTAB, unlabored breathing Abdominal: Normal bowel sound, soft, no masses Extremities: No clubbing, cyanosis, or edema Psychiatric: Normal behavior and affect Skin: dry, flaky skin, thin extremities  Lab Results: Basic Metabolic Panel:  Recent Labs Lab 07/02/15 0543 07/03/15 0448  NA 137 134*  K 3.8 3.8  CL 105 100*  CO2 24 24  GLUCOSE 85 100*  BUN <5* <5*  CREATININE 2.43* 1.94*  CALCIUM 7.1* 7.6*  PHOS 1.2* 1.0*   Liver Function Tests:  Recent Labs Lab 06/30/15 1320  07/02/15 0543 07/03/15 0448  AST 61*  --   --   --   ALT 16  --   --   --   ALKPHOS 365*  --   --   --   BILITOT 1.2  --   --   --   PROT 8.6*  --   --   --   ALBUMIN 2.2*  < > 1.5* 1.5*  < > = values in this interval not displayed. CBC:  Recent Labs Lab 06/30/15 1320  07/02/15 0543 07/03/15 0448  WBC 3.8*  < > 3.6* 3.3*  NEUTROABS 2.9  --   --   --   HGB 12.4  < > 10.6* 10.0*  HCT 37.8  < > 33.2* 32.7*  MCV 101.9*  < > 105.1* 105.5*  PLT 106*  < > 72* 78*  < > = values in this  interval not displayed. CBG:  Recent Labs Lab 06/30/15 1230  GLUCAP 170*   Urinalysis:  Recent Labs Lab 06/30/15 [place] 1.013  PHURINE 7.5  GLUCOSEU NEGATIVE  HGBUR MODERATE*  BILIRUBINUR NEGATIVE  KETONESUR NEGATIVE  PROTEINUR >300*  NITRITE POSITIVE*  LEUKOCYTESUR TRACE*    Micro Results: Recent Results (from the past 240 hour(s))  Culture, blood (routine x 2)     Status: None (Preliminary result)   Collection Time: 06/30/15  1:00 PM  Result Value Ref Range Status   Specimen Description BLOOD RIGHT ANTECUBITAL  Final   Special Requests BOTTLES DRAWN AEROBIC AND ANAEROBIC 3CC  Final   Culture NO GROWTH 2 DAYS  Final   Report Status PENDING  Incomplete  Culture, blood (routine x 2)     Status: None (Preliminary result)   Collection Time: 06/30/15  1:20 PM  Result Value Ref Range Status   Specimen Description BLOOD  RIGHT ANTECUBITAL  Final   Special Requests BOTTLES DRAWN AEROBIC AND ANAEROBIC 6CC  Final   Culture NO GROWTH 2 DAYS  Final   Report Status PENDING  Incomplete  Urine culture     Status: None   Collection Time: 06/30/15  7:09 PM  Result Value Ref Range Status   Specimen Description URINE, RANDOM  Final   Special Requests NONE  Final   Culture MULTIPLE SPECIES PRESENT, SUGGEST RECOLLECTION  Final   Report Status 07/01/2015 FINAL  Final  MRSA PCR Screening     Status: None   Collection Time: 07/01/15 12:30 AM  Result Value Ref Range Status   MRSA by PCR NEGATIVE NEGATIVE Final    Comment:        The GeneXpert MRSA Assay (FDA approved for NASAL specimens only), is one component of a comprehensive MRSA colonization surveillance program. It is not intended to diagnose MRSA infection nor to guide or monitor treatment for MRSA infections.   C difficile quick scan w PCR reflex     Status: None   Collection Time: 07/02/15  8:40 PM  Result Value Ref Range Status   C Diff antigen NEGATIVE NEGATIVE Final   C Diff toxin  NEGATIVE NEGATIVE Final   C Diff interpretation Negative for toxigenic C. difficile  Final   Studies/Results: No results found. Medications: I have reviewed the patient's current medications. Scheduled Meds: . azaTHIOprine  50 mg Oral Daily  . feeding supplement (ENSURE ENLIVE)  237 mL Oral BID BM  . FLUoxetine  10 mg Oral Daily  . folic acid  1 mg Oral Daily  . gabapentin  300 mg Oral Daily  . heparin  5,000 Units Subcutaneous 3 times per day  . hydroxychloroquine  400 mg Oral Daily  . lipase/protease/amylase  12,000 Units Oral TID AC  . mirtazapine  7.5 mg Oral QHS  . pantoprazole  40 mg Oral Daily  . piperacillin-tazobactam (ZOSYN)  IV  2.25 g Intravenous 3 times per day  . ramelteon  8 mg Oral QHS  . sodium chloride flush  3 mL Intravenous Q12H  . thiamine  100 mg Oral Daily  . vancomycin  500 mg Intravenous Q M,W,F-HD  . vitamin B-12  250 mcg Oral QPM   Continuous Infusions:  PRN Meds:.acetaminophen, albuterol, LORazepam **OR** LORazepam, ondansetron Assessment/Plan:  Systemic Inflammatory Response Syndrome: Patient met 4/4 SIRS criteria on admission. Unlikely lupus flare as Anti-DNA Ab was normal. She has had diarrhea since admission likely 2/2 IV abx, C. Diff negative. She had a fever of 100.19F at 8:00pm last night which could be defervescence w/ abx.  - Vancomycin and Zosyn IV - GI pathogen panel pending  - Blood culture no growth x 3 days - Urine Cx shows multiple species on admission, will repeat a UA w/ urine culture  - will continue to monitor for fever and consult VVS to evaluate for possible graft infection  SLE with ESRD: MWF dialysis. Had HD yesterday.  - Nephrology following - Azathioprine 50 mg daily - Hydroxychloroquine 400 mg daily, per patient has not been on this as outpatient due to cost.   Anemia of Chronic Disease: On Aranesp  Protein Calorie Malnutrition: Albumin 1.6. Continue feeding supplement, Creon, B12, folate, and multivitamin.  Alcohol Use  Disorder: Unclear when last drink was - CIWA in place - Thiamine, folate, multivitamin  Depression, Weight loss, and Insomnia: Continue prozac, ramelteon, and remeron.  DVT Prophylaxis: Heparin [place]  Dispo: Disposition is deferred at this time,  awaiting improvement of current medical problems.   The patient does have a current PCP Shela Leff, MD) and does need an [facility] hospital follow-up appointment after discharge.  The patient does have transportation limitations that hinder transportation to clinic appointments.  .Services Needed at time of discharge: Y = Yes, Blank = No PT:   OT:   RN:   Equipment:   Other:     LOS: 3 days   Norman Herrlich, MD 07/03/2015, 9:45 AM

## 2015-07-04 LAB — URINALYSIS, ROUTINE W REFLEX MICROSCOPIC
BILIRUBIN URINE: NEGATIVE
Glucose, UA: NEGATIVE mg/dL
KETONES UR: NEGATIVE mg/dL
LEUKOCYTES UA: NEGATIVE
NITRITE: NEGATIVE
Specific Gravity, Urine: 1.016 (ref 1.005–1.030)
pH: 8.5 — ABNORMAL HIGH (ref 5.0–8.0)

## 2015-07-04 LAB — RENAL FUNCTION PANEL
ALBUMIN: 1.5 g/dL — AB (ref 3.5–5.0)
Anion gap: 9 (ref 5–15)
BUN: 6 mg/dL (ref 6–20)
CALCIUM: 7.6 mg/dL — AB (ref 8.9–10.3)
CO2: 26 mmol/L (ref 22–32)
Chloride: 99 mmol/L — ABNORMAL LOW (ref 101–111)
Creatinine, Ser: 3.17 mg/dL — ABNORMAL HIGH (ref 0.44–1.00)
GFR, EST AFRICAN AMERICAN: 17 mL/min — AB (ref >60)
GFR, EST NON AFRICAN AMERICAN: 15 mL/min — AB (ref >60)
Glucose, Bld: 80 mg/dL (ref 65–99)
PHOSPHORUS: 2.8 mg/dL (ref 2.5–4.6)
Potassium: 4.1 mmol/L (ref 3.5–5.1)
SODIUM: 134 mmol/L — AB (ref 135–145)

## 2015-07-04 LAB — CBC
HCT: 32.7 % — ABNORMAL LOW (ref 36.0–46.0)
HEMOGLOBIN: 9.9 g/dL — AB (ref 12.0–15.0)
MCH: 31.6 pg (ref 26.0–34.0)
MCHC: 30.3 g/dL (ref 30.0–36.0)
MCV: 104.5 fL — ABNORMAL HIGH (ref 78.0–100.0)
PLATELETS: 88 10*3/uL — AB (ref 150–400)
RBC: 3.13 MIL/uL — AB (ref 3.87–5.11)
RDW: 17.3 % — ABNORMAL HIGH (ref 11.5–15.5)
WBC: 5.2 10*3/uL (ref 4.0–10.5)

## 2015-07-04 LAB — URINE MICROSCOPIC-ADD ON
Bacteria, UA: NONE SEEN
RBC / HPF: NONE SEEN RBC/hpf (ref 0–5)

## 2015-07-04 MED ORDER — LOPERAMIDE HCL 2 MG PO CAPS
4.0000 mg | ORAL_CAPSULE | ORAL | Status: DC | PRN
  Administered 2015-07-04: 4 mg via ORAL
  Filled 2015-07-04: qty 2

## 2015-07-04 MED ORDER — RENA-VITE PO TABS
2.0000 | ORAL_TABLET | Freq: Every day | ORAL | Status: DC
  Administered 2015-07-04: 2 via ORAL
  Filled 2015-07-04: qty 1

## 2015-07-04 MED ORDER — HYDROCORTISONE 10 MG PO TABS: 10.0000 mg | ORAL_TABLET | Freq: Two times a day (BID) | ORAL | Status: DC

## 2015-07-04 NOTE — Progress Notes (Signed)
Subjective:  No complaints  Objective Vital signs in last 24 hours: Filed Vitals:   07/03/15 1700 07/03/15 2113 07/04/15 0521 07/04/15 0900  BP: 135/79 131/75 131/76 123/71  Pulse: 95 91 79 100  Temp: 99.9 F (37.7 C) 98.5 F (36.9 C) 100.1 F (37.8 C) 98.5 F (36.9 C)  TempSrc: Oral   Oral  Resp: 18 16 14 16   Height:      Weight:  38 kg (83 lb 12.4 oz)    SpO2: 99% 100% 92% 98%   Weight change: -0.5 kg (-1 lb 1.6 oz) Physical Exam: General =Alert , NAD calm Lungs=CTA bilat Card=RRR no mrg Abd= soft nt nd +bs Ext =no wounds or ulcers HD access=pos bruit LUA AVG  [facility] 4h F160 37.5kg 4K/2.25 bath LUA AVG Heparin 2000 Mircera 100 q 2, last 1/18 Calcitriol 1.25 ug tiw   Assessment: 1 Fevers/ diarrhea - temps down, cx's/ pcr's negative except urine cx mult colonies. Abx stopped today w concern for causing diarrhea 2 ESRD =HD mwf 3 SLE =on plaquenil/ imuran 4 Anemia =on esa 5 HTN/volume  = bp's normal, not on bp medication. 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 Added MVI. 8 Hx etoh abuse/ chron pancreatitis  Plan - HD Monday, min UF    Labs: Basic Metabolic Panel:  Recent Labs Lab 07/02/15 0543 07/03/15 0448 07/04/15 0530  NA 137 134* 134*  K 3.8 3.8 4.1  CL 105 100* 99*  CO2 24 24 26   GLUCOSE 85 100* 80  BUN <5* <5* 6  CREATININE 2.43* 1.94* 3.17*  CALCIUM 7.1* 7.6* 7.6*  PHOS 1.2* 1.0* 2.8   Liver Function Tests:  Recent Labs Lab 06/30/15 1320  07/02/15 0543 07/03/15 0448 07/04/15 0530  AST 61*  --   --   --   --   ALT 16  --   --   --   --   ALKPHOS 365*  --   --   --   --   BILITOT 1.2  --   --   --   --   PROT 8.6*  --   --   --   --   ALBUMIN 2.2*  < > 1.5* 1.5* 1.5*  < > = values in this interval not displayed. No results for input(s): LIPASE, AMYLASE in the last 168 hours. No results for input(s): AMMONIA in the last 168  hours. CBC:  Recent Labs Lab 06/30/15 1320 07/01/15 0922 07/02/15 0543 07/03/15 0448 07/04/15 0530  WBC 3.8* 3.4* 3.6* 3.3* 5.2  NEUTROABS 2.9  --   --   --   --   HGB 12.4 9.7* 10.6* 10.0* 9.9*  HCT 37.8 30.1* 33.2* 32.7* 32.7*  MCV 101.9* 102.4* 105.1* 105.5* 104.5*  PLT 106* 81* 72* 78* 88*   CBG:  Recent Labs Lab 06/30/15 1230  GLUCAP 170*    Studies/Results: No results found. Medications:   . azaTHIOprine  50 mg Oral Daily  . feeding supplement (ENSURE ENLIVE)  237 mL Oral BID BM  . FLUoxetine  10 mg Oral Daily  . folic acid  1 mg Oral Daily  . gabapentin  300 mg Oral Daily  . heparin  5,000 Units Subcutaneous 3 times per day  . hydroxychloroquine  400 mg Oral Daily  . lipase/protease/amylase  12,000 Units Oral TID AC  . mirtazapine  7.5 mg Oral QHS  .  pantoprazole  40 mg Oral Daily  . phosphorus  500 mg Oral BID  . ramelteon  8 mg Oral QHS  . sodium chloride flush  3 mL Intravenous Q12H  . thiamine  100 mg Oral Daily  . vitamin B-12  250 mcg Oral QPM

## 2015-07-04 NOTE — Progress Notes (Signed)
Subjective:  Pt reports waking up in urine and loose stool but denies subjective fevers overnight. She has had diarrhea since the day after admission. Her Tmax was 100.1 over the past 24 hours.   Objective: Vital signs in last 24 hours: Filed Vitals:   07/03/15 0900 07/03/15 1700 07/03/15 2113 07/04/15 0521  BP: 129/75 135/79 131/75 131/76  Pulse: 82 95 91 79  Temp: 98.2 F (36.8 C) 99.9 F (37.7 C) 98.5 F (36.9 C) 100.1 F (37.8 C)  TempSrc: Oral Oral    Resp: _0 Height:      Weight:   83 lb 12.4 oz (38 kg)   SpO2: 100% 99% 100% 92%   Weight change: -1 lb 1.6 oz (-0.5 kg)  Intake/Output Summary (Last 24 hours) at 07/04/15 0942 Last data filed at 07/04/15 0800  Gross per 24 hour  Intake    890 ml  Output     30 ml  Net    860 ml   Physical Exam General: Thin appearing. Lying in bed, NAD HEENT: Hair loss noted. NCAT, sclerae anicteric Cardiovascular: Continuous murmur. Regular rhythm, no rubs.  Pulmonary: CTAB, unlabored breathing Abdominal: Mild discomfort to palpation. Normal bowel sound, soft, no masses Extremities: No clubbing, cyanosis, or edema Psychiatric: Normal behavior and affect Skin: dry, flaky skin, thin extremities. No erythema or tenderness at AVF site  Lab Results: Basic Metabolic Panel:  Recent Labs Lab 07/03/15 0448 07/04/15 0530  NA 134* 134*  K 3.8 4.1  CL 100* 99*  CO2 24 26  GLUCOSE 100* 80  BUN <5* 6  CREATININE 1.94* 3.17*  CALCIUM 7.6* 7.6*  PHOS 1.0* 2.8   Liver Function Tests:  Recent Labs Lab 06/30/15 1320  07/03/15 0448 07/04/15 0530  AST 61*  --   --   --   ALT 16  --   --   --   ALKPHOS 365*  --   --   --   BILITOT 1.2  --   --   --   PROT 8.6*  --   --   --   ALBUMIN 2.2*  < > 1.5* 1.5*  < > = values in this interval not displayed. CBC:  Recent Labs Lab 06/30/15 1320  07/03/15 0448 07/04/15 0530  WBC 3.8*  < > 3.3* 5.2  NEUTROABS 2.9  --   --   --   HGB 12.4  < > 10.0* 9.9*  HCT 37.8  < >  32.7* 32.7*  MCV 101.9*  < > 105.5* 104.5*  PLT 106*  < > 78* 88*  < > = values in this interval not displayed. CBG:  Recent Labs Lab 06/30/15 1230  GLUCAP 170*   Urinalysis:  Recent Labs Lab 06/30/15 1909 07/04/15 0813  COLORURINE AMBER* YELLOW  LABSPEC 1.013 1.016  PHURINE 7.5 8.5*  GLUCOSEU NEGATIVE NEGATIVE  HGBUR MODERATE* SMALL*  BILIRUBINUR NEGATIVE NEGATIVE  KETONESUR NEGATIVE NEGATIVE  PROTEINUR >300* >300*  NITRITE POSITIVE* NEGATIVE  LEUKOCYTESUR TRACE* NEGATIVE    Micro Results: Recent Results (from the past 240 hour(s))  Culture, blood (routine x 2)     Status: None (Preliminary result)   Collection Time: 06/30/15  1:00 PM  Result Value Ref Range Status   Specimen Description BLOOD RIGHT ANTECUBITAL  Final   Special Requests BOTTLES DRAWN AEROBIC AND ANAEROBIC 3CC  Final   Culture NO GROWTH 3 DAYS  Final   Report Status PENDING  Incomplete  Culture, blood (routine x  2)     Status: None (Preliminary result)   Collection Time: 06/30/15  1:20 PM  Result Value Ref Range Status   Specimen Description BLOOD RIGHT ANTECUBITAL  Final   Special Requests BOTTLES DRAWN AEROBIC AND ANAEROBIC 6CC  Final   Culture NO GROWTH 3 DAYS  Final   Report Status PENDING  Incomplete  Urine culture     Status: None   Collection Time: 06/30/15  7:09 PM  Result Value Ref Range Status   Specimen Description URINE, RANDOM  Final   Special Requests NONE  Final   Culture MULTIPLE SPECIES PRESENT, SUGGEST RECOLLECTION  Final   Report Status 07/01/2015 FINAL  Final  MRSA PCR Screening     Status: None   Collection Time: 07/01/15 12:30 AM  Result Value Ref Range Status   MRSA by PCR NEGATIVE NEGATIVE Final    Comment:        The GeneXpert MRSA Assay (FDA approved for NASAL specimens only), is one component of a comprehensive MRSA colonization surveillance program. It is not intended to diagnose MRSA infection nor to guide or monitor treatment for MRSA infections.   C  difficile quick scan w PCR reflex     Status: None   Collection Time: 07/02/15  8:40 PM  Result Value Ref Range Status   C Diff antigen NEGATIVE NEGATIVE Final   C Diff toxin NEGATIVE NEGATIVE Final   C Diff interpretation Negative for toxigenic C. difficile  Final  Gastrointestinal Panel by PCR , Stool     Status: None   Collection Time: 07/02/15  8:40 PM  Result Value Ref Range Status   Campylobacter species NOT DETECTED NOT DETECTED Final   Plesimonas shigelloides NOT DETECTED NOT DETECTED Final   Salmonella species NOT DETECTED NOT DETECTED Final   Yersinia enterocolitica NOT DETECTED NOT DETECTED Final   Vibrio species NOT DETECTED NOT DETECTED Final   Vibrio cholerae NOT DETECTED NOT DETECTED Final   Enteroaggregative E coli (EAEC) NOT DETECTED NOT DETECTED Final   Enteropathogenic E coli (EPEC) NOT DETECTED NOT DETECTED Final   Enterotoxigenic E coli (ETEC) NOT DETECTED NOT DETECTED Final   Shiga like toxin producing E coli (STEC) NOT DETECTED NOT DETECTED Final   E. coli O157 NOT DETECTED NOT DETECTED Final   Shigella/Enteroinvasive E coli (EIEC) NOT DETECTED NOT DETECTED Final   Cryptosporidium NOT DETECTED NOT DETECTED Final   Cyclospora cayetanensis NOT DETECTED NOT DETECTED Final   Entamoeba histolytica NOT DETECTED NOT DETECTED Final   Giardia lamblia NOT DETECTED NOT DETECTED Final   Adenovirus F40/41 NOT DETECTED NOT DETECTED Final   Astrovirus NOT DETECTED NOT DETECTED Final   Norovirus GI/GII NOT DETECTED NOT DETECTED Final   Rotavirus A NOT DETECTED NOT DETECTED Final   Sapovirus (I, II, IV, and V) NOT DETECTED NOT DETECTED Final   Studies/Results: No results found. Medications: I have reviewed the patient's current medications. Scheduled Meds: . azaTHIOprine  50 mg Oral Daily  . feeding supplement (ENSURE ENLIVE)  237 mL Oral BID BM  . FLUoxetine  10 mg Oral Daily  . folic acid  1 mg Oral Daily  . gabapentin  300 mg Oral Daily  . heparin  5,000 Units  Subcutaneous 3 times per day  . hydroxychloroquine  400 mg Oral Daily  . lipase/protease/amylase  12,000 Units Oral TID AC  . mirtazapine  7.5 mg Oral QHS  . pantoprazole  40 mg Oral Daily  . phosphorus  500 mg Oral BID  .  ramelteon  8 mg Oral QHS  . sodium chloride flush  3 mL Intravenous Q12H  . thiamine  100 mg Oral Daily  . vitamin B-12  250 mcg Oral QPM   Continuous Infusions:  PRN Meds:.acetaminophen, albuterol, ondansetron Assessment/Plan:  Fever: Patient met 4/4 SIRS criteria on admission. Unlikely lupus flare as Anti-DNA Ab was normal. She has had diarrhea since admission likely 2/2 IV abx, C. Diff negative. GI pathogen panel negative. She had a temperature of 100.35F this morning. Blood cultures have been negative for 3 days and repeat UA on 1/29 was reassuring. Will discontinue antibiotics given we have not yet identified a source and monitor for symptom recurrence. - D/c Vancomycin and Zosyn IV - Blood culture no growth x 3 days - Urine Cx shows multiple species on admission, repeat UA reassuring, repeat UCx pending - will continue to monitor for fever and consult VVS to evaluate for possible graft infection if she spikes a fever off ABX  SLE with ESRD: MWF dialysis. Had HD on 1/27. Back on schedule - Nephrology following - Azathioprine 50 mg daily - Hydroxychloroquine 400 mg daily, per patient has not been on this as outpatient due to cost.   Anemia of Chronic Disease: On Aranesp  Protein Calorie Malnutrition: Albumin 1.5. Continue feeding supplement, Creon, B12, folate, and multivitamin.  Alcohol Use Disorder: Unclear when last drink was - CIWA in place - Thiamine, folate, multivitamin  Depression, Weight loss, and Insomnia: Continue prozac, ramelteon, and remeron.  DVT Prophylaxis: Heparin   Dispo: Disposition is deferred at this time, awaiting improvement of current medical problems.   The patient does have a current PCP Shela Leff, MD) and does need  an [facility] hospital follow-up appointment after discharge.  The patient does have transportation limitations that hinder transportation to clinic appointments.  .Services Needed at time of discharge: Y = Yes, Blank = No PT:   OT:   RN:   Equipment:   Other:     LOS: 4 days   Liberty Handy, MD 07/04/2015, 9:42 AM

## 2015-07-05 LAB — URINE CULTURE

## 2015-07-05 LAB — CULTURE, BLOOD (ROUTINE X 2)
CULTURE: NO GROWTH
Culture: NO GROWTH

## 2015-07-05 LAB — CBC
HEMATOCRIT: 32.5 % — AB (ref 36.0–46.0)
Hemoglobin: 10.4 g/dL — ABNORMAL LOW (ref 12.0–15.0)
MCH: 33.2 pg (ref 26.0–34.0)
MCHC: 32 g/dL (ref 30.0–36.0)
MCV: 103.8 fL — AB (ref 78.0–100.0)
Platelets: 89 10*3/uL — ABNORMAL LOW (ref 150–400)
RBC: 3.13 MIL/uL — AB (ref 3.87–5.11)
RDW: 17.4 % — AB (ref 11.5–15.5)
WBC: 3.5 10*3/uL — AB (ref 4.0–10.5)

## 2015-07-05 LAB — RENAL FUNCTION PANEL
Albumin: 1.5 g/dL — ABNORMAL LOW (ref 3.5–5.0)
Anion gap: 10 (ref 5–15)
BUN: 9 mg/dL (ref 6–20)
CHLORIDE: 102 mmol/L (ref 101–111)
CO2: 22 mmol/L (ref 22–32)
Calcium: 7.8 mg/dL — ABNORMAL LOW (ref 8.9–10.3)
Creatinine, Ser: 4.28 mg/dL — ABNORMAL HIGH (ref 0.44–1.00)
GFR calc Af Amer: 12 mL/min — ABNORMAL LOW (ref >60)
GFR, EST NON AFRICAN AMERICAN: 10 mL/min — AB (ref >60)
Glucose, Bld: 77 mg/dL (ref 65–99)
POTASSIUM: 4.5 mmol/L (ref 3.5–5.1)
Phosphorus: 3.1 mg/dL (ref 2.5–4.6)
Sodium: 134 mmol/L — ABNORMAL LOW (ref 135–145)

## 2015-07-05 MED ORDER — LIDOCAINE HCL (PF) 1 % IJ SOLN: 5.0000 mL | INTRAMUSCULAR | Status: DC | PRN

## 2015-07-05 MED ORDER — LIDOCAINE-PRILOCAINE 2.5-2.5 % EX CREA: 1.0000 "application " | TOPICAL_CREAM | CUTANEOUS | Status: DC | PRN

## 2015-07-05 MED ORDER — SODIUM CHLORIDE 0.9 % IV SOLN
100.0000 mL | INTRAVENOUS | Status: DC | PRN
Start: 2015-07-05 — End: 2015-07-05

## 2015-07-05 MED ORDER — HEPARIN SODIUM (PORCINE) 1000 UNIT/ML DIALYSIS: 2000.0000 [IU] | INTRAMUSCULAR | Status: DC | PRN

## 2015-07-05 MED ORDER — HEPARIN SODIUM (PORCINE) 1000 UNIT/ML DIALYSIS: 1000.0000 [IU] | INTRAMUSCULAR | Status: DC | PRN

## 2015-07-05 MED ORDER — ALTEPLASE 2 MG IJ SOLR: 2.0000 mg | Freq: Once | INTRAMUSCULAR | Status: DC | PRN

## 2015-07-05 MED ORDER — PENTAFLUOROPROP-TETRAFLUOROETH EX AERO: 1.0000 "application " | INHALATION_SPRAY | CUTANEOUS | Status: DC | PRN

## 2015-07-05 NOTE — Discharge Instructions (Signed)
Ms. Cabreros, it was a pleasure taking care of you in the hospital. You were admitted for a fever, which is either due to lupus from not taking Plaquenil or the stomach flu. The diarrhea you had in the hospital may have also been due to the antibiotics. You can continue to take Imodium at home, only as needed, for diarrhea.  To help you afford Plaquenil, we have arranged a follow-up visit in the [facility] on February 2nd. There, you will work with our Education officer, museum on options for affording your Plaquenil.  Please seek medical attention if you have symptoms of fever or your diarrhea severely worsens.

## 2015-07-05 NOTE — Procedures (Signed)
Patient was seen on dialysis and the procedure was supervised.  BFR 350  Via AVG BP is  143/85.   Patient appears to be tolerating treatment well  [name] [name] A 07/05/2015

## 2015-07-05 NOTE — Progress Notes (Signed)
Subjective:  Seen in HD- tmax 99.1- thinks that she will be going home today  Objective Vital signs in last 24 hours: Filed Vitals:   07/05/15 0747 07/05/15 0756 07/05/15 0801 07/05/15 0830  BP: 135/81 137/84 149/90 138/90  Pulse: 87 82 83 83  Temp:      TempSrc:      Resp: 18     Height:      Weight: 37.6 kg (82 lb 14.3 oz)     SpO2:       Weight change: -0.352 kg (-12.4 oz) Physical Exam: General =Alert , NAD calm Lungs=CTA bilat Card=RRR no mrg Abd= soft nt nd +bs Ext =no wounds or ulcers HD access=pos bruit LUA AVG  [facility] HD=[facility] 4h F160 37.5kg 4K/2.25 bath LUA AVG Heparin 2000 Mircera 100 q 2, last 1/18 Calcitriol 1.25 ug tiw   Problem/Plan: 1 Fever  (104 deg) - temp yest 99.1, afeb this am/. No blood cx's drawn at OP HD. Cx's here no growth so far , nl wbc/ admit team wu off IV abx 2 ESRD =HD mwf, now back on schedule/ k3.8 3 Vol = Only 1 kg >edw  4 SLE =on plaquenil/ imuran 5 Poss etoh abuse 6 Anemia =on esa 7 HTN = bp 125/74 , no bp meds at home./ BP's at HD normal to high 8 MBD = OFF Binders with PHOS 1.0 will give replacement po- now up to 3.1 / Ca corec = 9.6 on vit d on hd /  9 Nutrition = Alb 1.5 =with malnutrition  And  Low Phos and K ok  Change to Reg. Diet  Monitor labs 10. Dispo- OK for discharge from our standpoint   [place]   07/05/2015, 9:21 AM     Labs: Basic Metabolic Panel:  Recent Labs Lab 07/03/15 0448 07/04/15 0530 07/05/15 0550  NA 134* 134* 134*  K 3.8 4.1 4.5  CL 100* 99* 102  CO2 24 26 22   GLUCOSE 100* 80 77  BUN <5* 6 9  CREATININE 1.94* 3.17* 4.28*  CALCIUM 7.6* 7.6* 7.8*  PHOS 1.0* 2.8 3.1   Liver Function Tests:  Recent Labs Lab 06/30/15 1320  07/03/15 0448 07/04/15 0530 07/05/15 0550  AST 61*  --   --   --   --   ALT 16  --   --   --   --   ALKPHOS 365*  --   --   --   --   BILITOT 1.2  --   --   --   --   PROT 8.6*  --   --   --   --   ALBUMIN 2.2*  < > 1.5* 1.5* 1.5*  < >  = values in this interval not displayed. No results for input(s): LIPASE, AMYLASE in the last 168 hours. No results for input(s): AMMONIA in the last 168 hours. CBC:  Recent Labs Lab 06/30/15 1320 07/01/15 0922 07/02/15 0543 07/03/15 0448 07/04/15 0530 07/05/15 0550  WBC 3.8* 3.4* 3.6* 3.3* 5.2 3.5*  NEUTROABS 2.9  --   --   --   --   --   HGB 12.4 9.7* 10.6* 10.0* 9.9* 10.4*  HCT 37.8 30.1* 33.2* 32.7* 32.7* 32.5*  MCV 101.9* 102.4* 105.1* 105.5* 104.5* 103.8*  PLT 106* 81* 72* 78* 88* 89*   CBG:  Recent Labs Lab 06/30/15 1230  GLUCAP 170*    Studies/Results: No results found. Medications:   . azaTHIOprine  50 mg Oral Daily  .  feeding supplement (ENSURE ENLIVE)  237 mL Oral BID BM  . FLUoxetine  10 mg Oral Daily  . folic acid  1 mg Oral Daily  . gabapentin  300 mg Oral Daily  . heparin  5,000 Units Subcutaneous 3 times per day  . hydroxychloroquine  400 mg Oral Daily  . lipase/protease/amylase  12,000 Units Oral TID AC  . mirtazapine  7.5 mg Oral QHS  . multivitamin  2 tablet Oral QHS  . pantoprazole  40 mg Oral Daily  . ramelteon  8 mg Oral QHS  . sodium chloride flush  3 mL Intravenous Q12H  . thiamine  100 mg Oral Daily  . vitamin B-12  250 mcg Oral QPM

## 2015-07-05 NOTE — Evaluation (Signed)
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 Patient mobilizing well despite fatigue. No significant deficits at this time. No further acute PT needs, patient in agreement, will sign off.    Follow Up Recommendations No PT follow up    Equipment Recommendations  None recommended by PT    Recommendations for Other Services       Precautions / Restrictions Precautions Precautions: Fall Restrictions Weight Bearing Restrictions: No      Mobility  Bed Mobility Overal bed mobility: Independent                Transfers Overall transfer level: Independent                  Ambulation/Gait Ambulation/Gait assistance: Independent Ambulation Distance (Feet): 180 Feet Assistive device: None Gait Pattern/deviations: Step-through pattern;Narrow base of support     General Gait Details: increased fatigue with ambulation. ambulated on room air. Patient reported some mild dizziness upon completion of ambulation, resolved upon return to bed.   Stairs            Wheelchair Mobility    Modified Rankin (Stroke Patients Only)       Balance Overall balance assessment: No apparent balance deficits (not formally assessed)                                           Pertinent Vitals/Pain Pain Assessment: No/denies pain    Home Living Family/patient expects to be discharged to:: Private residence Living Arrangements: Children;Spouse/significant other Available Help at Discharge: Available PRN/intermittently Type of Home: House Home Access: Stairs to enter Entrance Stairs-Rails: Can reach both Entrance  Stairs-Number of Steps: 4 Home Layout: One level Home Equipment: Cane - single point;Grab bars - tub/shower;Shower seat Additional Comments: home O2--4L    Prior Function Level of Independence: Independent               Hand Dominance   Dominant Hand: Right    Extremity/Trunk Assessment   Upper Extremity Assessment: Overall WFL for tasks assessed           Lower Extremity Assessment: Overall WFL for tasks assessed      Cervical / Trunk Assessment: Kyphotic  Communication   Communication: No difficulties  Cognition Arousal/Alertness: Awake/alert Behavior During Therapy: WFL for tasks assessed/performed Overall Cognitive Status: Within Functional Limits for tasks assessed                      General Comments General comments (skin integrity, edema, etc.): BP soft but stable 110s/50s    Exercises        Assessment/Plan    PT Assessment Patent does not need any further PT services  PT Diagnosis Generalized weakness;Difficulty walking   PT Problem List    PT Treatment Interventions     PT Goals (Current goals can be found in the Care Plan section) Acute Rehab PT Goals PT Goal Formulation: All assessment and education complete, DC therapy    Frequency     Barriers to discharge        Co-evaluation  End of Session Equipment Utilized During Treatment: Gait belt Activity Tolerance: Patient tolerated treatment well Patient left: in bed;with call bell/phone within reach Nurse Communication: Mobility status         Time: [id number] PT Time Calculation (min) (ACUTE ONLY): 17 min   Charges:   PT Evaluation $PT Eval Moderate Complexity: 1 Procedure     PT G CodesDuncan Dull 07-19-2015, 3:14 PM Alben Deeds, [place] DPT  [phone]

## 2015-07-05 NOTE — Discharge Summary (Signed)
Name: [name] [name] MRN: [id number] DOB: Feb 10, 1956 60 y.o. PCP: Shela Leff, MD  Date of Admission: 06/30/2015 12:14 PM Date of Discharge: 07/05/2015 Attending Physician: Aldine Contes, MD  Discharge Diagnosis: 1. Fever 2. Diarrhea 3. SLE 4. ESRD on Dialysis 5. Anemia of Chronic Disease 6. Protein Calorie Malnutrition 7. Alcohol Use Disorder 8. Depression  Discharge Medications:   Medication List    TAKE these medications        ACIDOPHILUS PROBIOTIC PO  Take 2 mg by mouth 3 (three) times daily. Take 2 (two) tablets by mouth three times daily     albuterol 108 (90 Base) MCG/ACT inhaler  Commonly known as:  PROAIR HFA  Inhale 1-2 puffs into the lungs every 6 (six) hours as needed for wheezing or shortness of breath.     azaTHIOprine 50 MG tablet  Commonly known as:  IMURAN  Take 50 mg by mouth daily.     calcium carbonate 500 MG chewable tablet  Commonly known as:  TUMS - dosed in mg elemental calcium  Chew 1,000 tablets by mouth at bedtime.     camphor-menthol lotion  Commonly known as:  SARNA  Apply 1 application topically daily as needed for itching.     cetirizine 10 MG tablet  Commonly known as:  ZYRTEC  Take 1 tablet (10 mg total) by mouth daily as needed for allergies.     CREON 12000 units Cpep capsule  Generic drug:  lipase/protease/amylase  Take 1 capsule (12,000 Units total) by mouth 3 (three) times daily before meals.     FLUoxetine 10 MG capsule  Commonly known as:  PROZAC  Take 1 capsule (10 mg total) by mouth daily. For depression     folic acid 1 MG tablet  Commonly known as:  FOLVITE  Take 1 tablet (1 mg total) by mouth daily. For folic acid replacement     gabapentin 300 MG capsule  Commonly known as:  NEURONTIN  Take 1 capsule (300 mg total) by mouth daily.     hydroxychloroquine 200 MG tablet  Commonly known as:  PLAQUENIL  Take 400 mg by mouth daily.     loperamide 2 MG capsule  Commonly known as:  IMODIUM  Take 2  mg by mouth every 6 (six) hours as needed for diarrhea or loose stools.     mirtazapine 7.5 MG tablet  Commonly known as:  REMERON  Take 1 tablet (7.5 mg total) by mouth at bedtime.     multivitamin with minerals Tabs tablet  Take 1 tablet by mouth daily. For vitamin replacement     omeprazole 40 MG capsule  Commonly known as:  PRILOSEC  Take 1 capsule (40 mg total) by mouth daily.     ramelteon 8 MG tablet  Commonly known as:  ROZEREM  Take 1 tablet (8 mg total) by mouth at bedtime.     thiamine 100 MG tablet  Commonly known as:  VITAMIN B-1  Take 1 tablet (100 mg total) by mouth daily. For low thiamine     vitamin B-12 250 MCG tablet  Commonly known as:  CYANOCOBALAMIN  Take 1 tablet (250 mcg total) by mouth every evening.        Disposition and follow-up:   [name] [name] was discharged from [facility] in stable condition.  At the hospital follow up visit please address:  1.  Patient was admitted for a fever. Blood cultures were negative by day 4 and a definitive infectious  source was never found. It was surmised that her fever could have been related to non-adherence to hydroxychloroquine due to cost. There is also a possibility of infectious diarrhea, but her GI pathogen panel was negative.  2. Patient was having some loose stools attributed to antibiotics. Symptoms resolved on Imodium. Please assess for continued resolution.  3. Please work with Golden Hurter, CSW to obtain a prior authorization for hydroxychloroquine to aid with adherence.  4.  Labs / imaging needed at time of follow-up: None  5.  Pending labs/ test needing follow-up: None  Follow-up Appointments:     Follow-up Information    Follow up with Jenetta Downer, MD On 07/08/2015.   Specialty:  Internal Medicine   Why:  at 10:15am for hospital f/u.    Contact information:   [place] [address] [phone]       Discharge Instructions: Discharge  Instructions    Diet - low sodium heart healthy    Complete by:  As directed      Increase activity slowly    Complete by:  As directed           [name] [name], it was a pleasure taking care of you in the hospital. You were admitted for a fever, which is either due to lupus from not taking Plaquenil or the stomach flu. The diarrhea you had in the hospital may have also been due to the antibiotics. You can continue to take Imodium at home, only as needed, for diarrhea.  To help you afford Plaquenil, we have arranged a follow-up visit in the Internal Medicine Clinic on February 2nd. There, you will work with our Education officer, museum on options for affording your Plaquenil.  Please seek medical attention if you have symptoms of fever or your diarrhea severely worsens.  Consultations: Treatment Team:  Mauricia Area, MD Roney Jaffe, MD  Procedures Performed:  X-ray Chest Pa And Lateral  07/01/2015  CLINICAL DATA:  Fever an nausea. EXAM: CHEST  2 VIEW COMPARISON:  June 30, 2015 FINDINGS: No pneumothorax. No change in mild cardiomegaly. The hila and mediastinum are unchanged. Mild interstitial prominence without overt edema. Tiny pleural effusions with blunting of the costophrenic angles on the lateral view. No other interval changes. IMPRESSION: Mild prominence of the interstitium could represent pulmonary venous congestion with tiny pleural effusions seen on the lateral view. No focal infiltrate. Electronically Signed   By: Dorise Bullion III M.D   On: 07/01/2015 07:42   Dg Chest Port 1 View  06/30/2015  CLINICAL DATA:  Patient is febrile with chills and vomiting. EXAM: PORTABLE CHEST 1 VIEW COMPARISON:  12/06/2014 FINDINGS: Cardiomediastinal silhouette is normal. Mediastinal contours appear intact. There is no evidence of focal airspace consolidation, pleural effusion or pneumothorax. Osseous structures are without acute abnormality. Soft tissues are grossly normal. IMPRESSION: No active disease.  Electronically Signed   By: Fidela Salisbury M.D.   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 This problem started this morning when she woke up. She felt nauseous and had one episode of yellowish, clear vomit. She also had chills, generalized weakness, and diminished appetite. She decided to go to dialysis anyway. When she arrived she was found to be hypotensive and febrile to 103 F. She did not initiate a dialysis session. Her  only other complains are pruritic rashes, dry skin, hair loss, longstanding dyspnea on exertion, longstanding dry cough, loose stools two days ago that has since resolved. She denies headache, neck stiffness, blackouts, vision changes, sore throat, chest pain, heart palpitations, dysuria, polyuria, tenderness at her AVF site, back pain, abdominal pain, productive cough, or joint pain. She has had an AVF infection in the past (12/2014). She follows with Drs. Marella Chimes and Leigh Aurora for her SLE, and has an appointment in February. She has a 10 pack-year smoking history. Alcohol use disorder is listed in her medical history but she denies any use. She has reportedly drank gin up to 3x a week and has had an EtOH withdrawal seizure. She denies any drug use. In the ED, she was noted to have a temperature of 103, pulse 120, and leukopenic to 3.8. Lactic acid was 4.48 and downtrended to 2.55. Blood and urine cultures were drawn, and she was started on vancomycin and zosyn.  Hospital Course by problem list:  Fever: The patient met 4/4 SIRS criteria on admission. However, investigation for an infectious etiology was negative. Blood cultures on admission showed no growth for 4 days, urine culture on admission was contaminated, and repeat UA and culture were reassuring. 2-View X-ray showed no  infiltrate. The patient lacked meningeal signs or mental status changes. No findings on exam to suggest endocarditis. Her AVF site was non-erythematous and non-tender through the admission, and the patient never spiked a fever during dialysis. The patient was started on broad spectrum antibiotics, vancomycin and zosyn IV. The patient had a hectic fever curve, starting at 103 on admission and eventually down-trending to 97.61F. She was typically afebrile during the day, and spiking fevers at night to 101.3. 24 hours prior to discharge, antibiotics were discontinued, and her temperature equilibrated around 7F for 24 hours. She also denied subjective fevers for 24 hours. The patient had indicated that she had not been taking Plaquenil due to cost, invoking the possibility of re-emerging lupus symptoms as a cause of her fever, despite a normal anti-DS-DNA. The patient also had diarrheal symptoms prior to admission, but a GI pathogen panel 48 hours after starting antibiotcs was negative. A consult was made to case management,which obtained coupons for Plaquenil. A follow-up appointment in the clinic was made for 07/08/15 to work on a prior authorization for Medicaid covering Plaquenil.   Diarrhea: Shortly after starting antibiotics, the patient started to complain of nightly loose stools. GI pathogen panel and C diff were negative 48 hours after being on antibiotics. Her symptoms abated after starting Imodium.  SLE with ESRD on Dialysis: Patient was diuresed two days in a row (1/26 and 1/27) due to missing dialysis on the 1/25. She returned to her MWF schedule. She was managed on Azathioprine 50 mg daily, Hydroxychloroquine 400 mg daily. Barriers to accessing Plaquenil at home addressed under "Fever."  Anemia of Chronic Disease: Hgb was stable in 9-10 range. On Aranesp  Protein Calorie Malnutrition: Noted to be very thin exam despite good appetite. Nutrition was consulted. Albumin 1.5. Continued on feeding  supplement, Creon, B12, folate, and multivitamin.  Alcohol Use Disorder: Patient reports no recent drinking history, but alcohol use is heavily documented in chart. Continued on CIWA, Thiamine, folate, multivitamin  Depression: Continued on prozac, ramelteon, and remeron.   Discharge Vitals:   BP 130/81 mmHg  Pulse 87  Temp(Src) 97.9 F (36.6 C) (Oral)  Resp 16  Ht 5' 1"  (1.549 m)  Wt 79 lb 12.9 oz (  36.2 kg)  BMI 15.09 kg/m2  SpO2 92%  Discharge Labs:  Results for orders placed or performed during the hospital encounter of 06/30/15 (from the past 24 hour(s))  Renal function panel     Status: Abnormal   Collection Time: 07/05/15  5:50 AM  Result Value Ref Range   Sodium 134 (L) 135 - 145 mmol/L   Potassium 4.5 3.5 - 5.1 mmol/L   Chloride 102 101 - 111 mmol/L   CO2 22 22 - 32 mmol/L   Glucose, Bld 77 65 - 99 mg/dL   BUN 9 6 - 20 mg/dL   Creatinine, Ser 4.28 (H) 0.44 - 1.00 mg/dL   Calcium 7.8 (L) 8.9 - 10.3 mg/dL   Phosphorus 3.1 2.5 - 4.6 mg/dL   Albumin 1.5 (L) 3.5 - 5.0 g/dL   GFR calc non Af Amer 10 (L) >60 mL/min   GFR calc Af Amer 12 (L) >60 mL/min   Anion gap 10 5 - 15  CBC     Status: Abnormal   Collection Time: 07/05/15  5:50 AM  Result Value Ref Range   WBC 3.5 (L) 4.0 - 10.5 K/uL   RBC 3.13 (L) 3.87 - 5.11 MIL/uL   Hemoglobin 10.4 (L) 12.0 - 15.0 g/dL   HCT 32.5 (L) 36.0 - 46.0 %   MCV 103.8 (H) 78.0 - 100.0 fL   MCH 33.2 26.0 - 34.0 pg   MCHC 32.0 30.0 - 36.0 g/dL   RDW 17.4 (H) 11.5 - 15.5 %   Platelets 89 (L) 150 - 400 K/uL    Signed: Liberty Handy, MD 07/05/2015, 3:28 PM

## 2015-07-05 NOTE — Care Management Note (Signed)
Case Management Note  Patient Details  Name: EDYTH LABBE MRN: [id number] Date of Birth: [birth year]  Subjective/Objective:              CM following for progression and d/c planning.      Action/Plan: 07/05/2015 Referral for assistance with Plaquenil , this medication is not on the [facility] Medicaid list of preferred and nonpreferred medications.  A search of manufacture assistance programs has failed to product a program for which this pt would qualify as she has a government medication assistance program with [place] Medicaid.  This CM spoke with Dr Vernell Barrier and Dr Hulen Luster and explained this situation. Pt was given coupons for money off of prescription and the number for [facility] Medicaid pharmacy [phone] has been provided to the doctor for possible prior auth for this drug. Per Dr Marijean Bravo when the pt makes her followup visit at the clinic her PCP may try to obtain prior authorization.   Expected Discharge Date:  07/05/15               Expected Discharge Plan:  Home/Self Care  In-House Referral:  NA  Discharge planning Services  CM Consult, Medication Assistance  Post Acute Care Choice:  NA Choice offered to:  NA  DME Arranged:  N/A DME Agency:  NA  HH Arranged:  NA HH Agency:  NA  Status of Service:  Completed, signed off  Medicare Important Message Given:    Date Medicare IM Given:    Medicare IM give by:    Date Additional Medicare IM Given:    Additional Medicare Important Message give by:     If discussed at [place] of Stay Meetings, dates discussed:    Additional Comments:  Adron Bene, RN 07/05/2015, 3:11 PM

## 2015-07-05 NOTE — Progress Notes (Signed)
Subjective:  Seen today in HD. Pt reports loose stools yesterday that resolved with Imodium. Denies any fever or chills last night.   Objective: Vital signs in last 24 hours: Filed Vitals:   07/05/15 1029 07/05/15 1100 07/05/15 1205 07/05/15 1255  BP: 126/82 123/83 108/78 130/81  Pulse: 98 98 95 87  Temp:   97.5 F (36.4 C) 97.9 F (36.6 C)  TempSrc:   Oral Oral  Resp:   14 16  Height:      Weight:   79 lb 12.9 oz (36.2 kg)   SpO2:    92%   Weight change: -12.4 oz (-0.352 kg)  Intake/Output Summary (Last 24 hours) at 07/05/15 1302 Last data filed at 07/05/15 1205  Gross per 24 hour  Intake    360 ml  Output   1019 ml  Net   -659 ml   Physical Exam General: Thin appearing. Lying in bed, NAD HEENT: Hair loss noted. NCAT, sclerae anicteric Cardiovascular: Continuous murmur. Regular rhythm, no rubs.  Pulmonary: CTAB, unlabored breathing Abdominal: Mild discomfort to palpation. Normal bowel sound, soft, no masses Extremities: No clubbing, cyanosis, or edema Psychiatric: Normal behavior and affect Skin: dry, flaky skin, thin extremities.   Lab Results: Basic Metabolic Panel:  Recent Labs Lab 07/04/15 0530 07/05/15 0550  NA 134* 134*  K 4.1 4.5  CL 99* 102  CO2 26 22  GLUCOSE 80 77  BUN 6 9  CREATININE 3.17* 4.28*  CALCIUM 7.6* 7.8*  PHOS 2.8 3.1   Liver Function Tests:  Recent Labs Lab 06/30/15 1320  07/04/15 0530 07/05/15 0550  AST 61*  --   --   --   ALT 16  --   --   --   ALKPHOS 365*  --   --   --   BILITOT 1.2  --   --   --   PROT 8.6*  --   --   --   ALBUMIN 2.2*  < > 1.5* 1.5*  < > = values in this interval not displayed. CBC:  Recent Labs Lab 06/30/15 1320  07/04/15 0530 07/05/15 0550  WBC 3.8*  < > 5.2 3.5*  NEUTROABS 2.9  --   --   --   HGB 12.4  < > 9.9* 10.4*  HCT 37.8  < > 32.7* 32.5*  MCV 101.9*  < > 104.5* 103.8*  PLT 106*  < > 88* 89*  < > = values in this interval not displayed. CBG:  Recent Labs Lab 06/30/15 1230    GLUCAP 170*   Urinalysis:  Recent Labs Lab 06/30/15 1909 07/04/15 0813  COLORURINE AMBER* YELLOW  LABSPEC 1.013 1.016  PHURINE 7.5 8.5*  GLUCOSEU NEGATIVE NEGATIVE  HGBUR MODERATE* SMALL*  BILIRUBINUR NEGATIVE NEGATIVE  KETONESUR NEGATIVE NEGATIVE  PROTEINUR >300* >300*  NITRITE POSITIVE* NEGATIVE  LEUKOCYTESUR TRACE* NEGATIVE    Micro Results: Recent Results (from the past 240 hour(s))  Culture, blood (routine x 2)     Status: None (Preliminary result)   Collection Time: 06/30/15  1:00 PM  Result Value Ref Range Status   Specimen Description BLOOD RIGHT ANTECUBITAL  Final   Special Requests BOTTLES DRAWN AEROBIC AND ANAEROBIC 3CC  Final   Culture NO GROWTH 4 DAYS  Final   Report Status PENDING  Incomplete  Culture, blood (routine x 2)     Status: None (Preliminary result)   Collection Time: 06/30/15  1:20 PM  Result Value Ref Range Status   Specimen Description BLOOD  RIGHT ANTECUBITAL  Final   Special Requests BOTTLES DRAWN AEROBIC AND ANAEROBIC 6CC  Final   Culture NO GROWTH 4 DAYS  Final   Report Status PENDING  Incomplete  Urine culture     Status: None   Collection Time: 06/30/15  7:09 PM  Result Value Ref Range Status   Specimen Description URINE, RANDOM  Final   Special Requests NONE  Final   Culture MULTIPLE SPECIES PRESENT, SUGGEST RECOLLECTION  Final   Report Status 07/01/2015 FINAL  Final  MRSA PCR Screening     Status: None   Collection Time: 07/01/15 12:30 AM  Result Value Ref Range Status   MRSA by PCR NEGATIVE NEGATIVE Final    Comment:        The GeneXpert MRSA Assay (FDA approved for NASAL specimens only), is one component of a comprehensive MRSA colonization surveillance program. It is not intended to diagnose MRSA infection nor to guide or monitor treatment for MRSA infections.   C difficile quick scan w PCR reflex     Status: None   Collection Time: 07/02/15  8:40 PM  Result Value Ref Range Status   C Diff antigen NEGATIVE NEGATIVE  Final   C Diff toxin NEGATIVE NEGATIVE Final   C Diff interpretation Negative for toxigenic C. difficile  Final  Gastrointestinal Panel by PCR , Stool     Status: None   Collection Time: 07/02/15  8:40 PM  Result Value Ref Range Status   Campylobacter species NOT DETECTED NOT DETECTED Final   Plesimonas shigelloides NOT DETECTED NOT DETECTED Final   Salmonella species NOT DETECTED NOT DETECTED Final   Yersinia enterocolitica NOT DETECTED NOT DETECTED Final   Vibrio species NOT DETECTED NOT DETECTED Final   Vibrio cholerae NOT DETECTED NOT DETECTED Final   Enteroaggregative E coli (EAEC) NOT DETECTED NOT DETECTED Final   Enteropathogenic E coli (EPEC) NOT DETECTED NOT DETECTED Final   Enterotoxigenic E coli (ETEC) NOT DETECTED NOT DETECTED Final   Shiga like toxin producing E coli (STEC) NOT DETECTED NOT DETECTED Final   E. coli O157 NOT DETECTED NOT DETECTED Final   Shigella/Enteroinvasive E coli (EIEC) NOT DETECTED NOT DETECTED Final   Cryptosporidium NOT DETECTED NOT DETECTED Final   Cyclospora cayetanensis NOT DETECTED NOT DETECTED Final   Entamoeba histolytica NOT DETECTED NOT DETECTED Final   Giardia lamblia NOT DETECTED NOT DETECTED Final   Adenovirus F40/41 NOT DETECTED NOT DETECTED Final   Astrovirus NOT DETECTED NOT DETECTED Final   Norovirus GI/GII NOT DETECTED NOT DETECTED Final   Rotavirus A NOT DETECTED NOT DETECTED Final   Sapovirus (I, II, IV, and V) NOT DETECTED NOT DETECTED Final  Culture, Urine     Status: None   Collection Time: 07/04/15  8:13 AM  Result Value Ref Range Status   Specimen Description URINE, RANDOM  Final   Special Requests NONE  Final   Culture 1,000 COLONIES/mL INSIGNIFICANT GROWTH  Final   Report Status 07/05/2015 FINAL  Final   Studies/Results: No results found. Medications: I have reviewed the patient's current medications. Scheduled Meds: . azaTHIOprine  50 mg Oral Daily  . feeding supplement (ENSURE ENLIVE)  237 mL Oral BID BM  .  FLUoxetine  10 mg Oral Daily  . folic acid  1 mg Oral Daily  . gabapentin  300 mg Oral Daily  . heparin  5,000 Units Subcutaneous 3 times per day  . hydroxychloroquine  400 mg Oral Daily  . lipase/protease/amylase  12,000 Units Oral TID  AC  . mirtazapine  7.5 mg Oral QHS  . multivitamin  2 tablet Oral QHS  . pantoprazole  40 mg Oral Daily  . ramelteon  8 mg Oral QHS  . sodium chloride flush  3 mL Intravenous Q12H  . thiamine  100 mg Oral Daily  . vitamin B-12  250 mcg Oral QPM   Continuous Infusions:  PRN Meds:.acetaminophen, albuterol, loperamide, ondansetron Assessment/Plan:  Fever: Patient has been afebrile for 24 hours off all antibiotics. It's possible her fever was due to lupus reemergence in setting of Plaquenil non-adherence, despite normal anti-DS DNA. She had diarrhea prior to admission, so there may have been a component of infectious diarrhea as well. GI path panel normal after 48 hours on IV abx. She is stable for discharge today. - Blood culture no growth x 4 days - Urine Cx shows multiple species on admission, repeat UA reassuring, repeat UCx showed insignificant growth  SLE with ESRD: MWF dialysis. Back on schedule - Nephrology following - Azathioprine 50 mg daily - Hydroxychloroquine 400 mg daily, per patient has not been on this as outpatient due to cost.  - SW consult to address cost issues related to Hydroxychloroquine   Anemia of Chronic Disease: On Aranesp  Protein Calorie Malnutrition: Albumin 1.5. Continue feeding supplement, Creon, B12, folate, and multivitamin.  Alcohol Use Disorder: Unclear when last drink was - CIWA in place - Thiamine, folate, multivitamin  Depression, Weight loss, and Insomnia: Continue prozac, ramelteon, and remeron.  DVT Prophylaxis: Heparin [place]  Dispo: Anticipated discharge today. The patient does have a current PCP Shela Leff, MD) and does need an [facility] hospital follow-up appointment after discharge.  The patient does  have transportation limitations that hinder transportation to clinic appointments.  .Services Needed at time of discharge: Y = Yes, Blank = No PT:   OT:   RN:   Equipment:   Other:     LOS: 5 days   Liberty Handy, MD 07/05/2015, 1:02 PM

## 2015-07-06 DIAGNOSIS — N031 Chronic nephritic syndrome with focal and segmental glomerular lesions: Secondary | ICD-10-CM | POA: Diagnosis not present

## 2015-07-06 DIAGNOSIS — N186 End stage renal disease: Secondary | ICD-10-CM | POA: Diagnosis not present

## 2015-07-06 DIAGNOSIS — Z992 Dependence on renal dialysis: Secondary | ICD-10-CM | POA: Diagnosis not present

## 2015-07-08 DIAGNOSIS — D688 Other specified coagulation defects: Secondary | ICD-10-CM | POA: Diagnosis not present

## 2015-07-08 DIAGNOSIS — R197 Diarrhea, unspecified: Secondary | ICD-10-CM | POA: Diagnosis not present

## 2015-07-08 DIAGNOSIS — N186 End stage renal disease: Secondary | ICD-10-CM | POA: Diagnosis not present

## 2015-07-08 DIAGNOSIS — E876 Hypokalemia: Secondary | ICD-10-CM | POA: Diagnosis not present

## 2015-07-08 DIAGNOSIS — D631 Anemia in chronic kidney disease: Secondary | ICD-10-CM | POA: Diagnosis not present

## 2015-07-09 DIAGNOSIS — D631 Anemia in chronic kidney disease: Secondary | ICD-10-CM | POA: Diagnosis not present

## 2015-07-09 DIAGNOSIS — E876 Hypokalemia: Secondary | ICD-10-CM | POA: Diagnosis not present

## 2015-07-09 DIAGNOSIS — R197 Diarrhea, unspecified: Secondary | ICD-10-CM | POA: Diagnosis not present

## 2015-07-09 DIAGNOSIS — D688 Other specified coagulation defects: Secondary | ICD-10-CM | POA: Diagnosis not present

## 2015-07-09 DIAGNOSIS — N186 End stage renal disease: Secondary | ICD-10-CM | POA: Diagnosis not present

## 2015-07-12 DIAGNOSIS — R197 Diarrhea, unspecified: Secondary | ICD-10-CM | POA: Diagnosis not present

## 2015-07-12 DIAGNOSIS — D631 Anemia in chronic kidney disease: Secondary | ICD-10-CM | POA: Diagnosis not present

## 2015-07-12 DIAGNOSIS — N186 End stage renal disease: Secondary | ICD-10-CM | POA: Diagnosis not present

## 2015-07-12 DIAGNOSIS — E876 Hypokalemia: Secondary | ICD-10-CM | POA: Diagnosis not present

## 2015-07-12 DIAGNOSIS — D688 Other specified coagulation defects: Secondary | ICD-10-CM | POA: Diagnosis not present

## 2015-07-13 ENCOUNTER — Ambulatory Visit (INDEPENDENT_AMBULATORY_CARE_PROVIDER_SITE_OTHER): Payer: Medicare Other | Admitting: Internal Medicine

## 2015-07-13 ENCOUNTER — Encounter: Payer: Self-pay | Admitting: Internal Medicine

## 2015-07-13 VITALS — BP 124/76 | HR 103 | Temp 98.5°F | Wt 83.2 lb

## 2015-07-13 DIAGNOSIS — N186 End stage renal disease: Secondary | ICD-10-CM | POA: Diagnosis not present

## 2015-07-13 DIAGNOSIS — R61 Generalized hyperhidrosis: Secondary | ICD-10-CM

## 2015-07-13 DIAGNOSIS — R509 Fever, unspecified: Secondary | ICD-10-CM | POA: Diagnosis not present

## 2015-07-13 DIAGNOSIS — B370 Candidal stomatitis: Secondary | ICD-10-CM | POA: Diagnosis not present

## 2015-07-13 DIAGNOSIS — Z992 Dependence on renal dialysis: Secondary | ICD-10-CM

## 2015-07-13 LAB — LACTIC ACID, PLASMA: Lactic Acid, Venous: 3.2 mmol/L (ref 0.5–2.0)

## 2015-07-13 MED ORDER — NYSTATIN 100000 UNIT/ML MT SUSP: 5.0000 mL | Freq: Four times a day (QID) | OROMUCOSAL | Status: DC

## 2015-07-13 NOTE — Patient Instructions (Signed)
We will like to get a CT scan of your back as soon as possible.     I will also call in a medication for you to rinse your mouth with to clear off the whitish coating on your tongue.

## 2015-07-13 NOTE — Assessment & Plan Note (Addendum)
Pt with fever of 103 on admission- 1/25, trended down, no fever in 24hrs prior to discharge- 1/30. ?etiology. Blood cultures still no growth. Pt today does not know if she is been having fevers but says she is still having night sweats and chills. No fever in clinic, none reported from HD. Pt is on immunosupressive therapy- Azathioprine for SLE. She also today reports having bowel incontinence. She has no focal deficits. No prior strokes and no back pain. No joint swelling or pain to suggest lupus flare. Differentials- Abscess or mass causing reduced sphincter tone, and anal incontinence  Plan- Considering sweats, and ?undiagnosed fever etiology in immunocompromised pt and fecal incontinence, will get [address], stat, pt said she had to leave that her transportation back home was waiting, also consider lymphomas in pts with immunocompromise and connective tissue dx. - CBC - Lactic acid- elevated at 3.32, in setting of ESRD, elevated lactic acid on admission , cleared with gentle hydration, call pt and encourage to stay hydrated. Pt did not appear septic, with normal vitals. - Pt to come in 1 week for re-evaluation - Work on getting pts prescription for Plaquenil.  Addendum- Called today by Radiologist about CT scan results showing that she has an inflammed gallbladder, with chronic pancreatitis. Ultrasound for further evaluation is recommended. I called  Patient with results of test today and need for further work up, she says she had a fever of 101 this afternoon, feels ill. I told her she will need to come to the Ed for evaluation as the clinic is closed now. My concern is that she may become septic. She is immunocompromised on azathioprine. I already ordered a Stat RUQ Abdominal ultrasound that will need prior approval. She agreed to go to the Ed for evaluation. An acute cholecystitis might be the etiology for her fevers, but weird that she has no abdominal pain.

## 2015-07-13 NOTE — Progress Notes (Signed)
[name], female   DOB: [birth year], 60 y.o.   MRN: [id number]   Subjective:   [name] female   DOB: [birth year] 60 y.o.   MRN: [id number]  HPI: [name] is a 60 y.o. with PMH- ESRD started HD- May 2016, SLE, HTN, presented today for hospital follow up. Pt was admitted- 1/25 and discahrged 1/30 managed for ?Sepsis, with fever of unknown etiology, she was also non complaints with her plaquenil and this was thought to be contributing to her fever. She has been complaint with only azathioprine since discharge, says she does not have the plaquenil yet. She has been having night sweats and chills, she has not been checking her temperature, she does not think or know if she is still running fevers. But none detected at dialysis so far. She presents today wth complaints of  spontaneoulsy seeing feaces in her underwear, she says has had this episodes for the past 3 days, everyday about 3 times  A day. Stools are not loose or watery, she describes it has a blop of stool, non bloody and not black. She has no problems with urinary continence. She denies prior episodes. No jerking of her extremities of LOC, though she has a remote hx of seizures.  She has no no prior strokes that she knows about, says he has some weakness of her legs, that one feels heavy and the other feels weak. She denies back pain or injury to her back.   Past Medical History  Diagnosis Date  . Anemia, B12 deficiency   . History of acute pancreatitis   . Right knee pain     No recent imaging on chart  . 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 Dr. Mancel Bale, [facility]  . Hypertriglyceridemia   . GERD (gastroesophageal reflux disease)   . Subdural hematoma ([place]) 02/2008    Likely 2/2 trauma from seizure from EtOH withdrawal, chronic in nature, sees Dr. Jerene Bears. Most recent CT head 10/2009 showing stable but persistent  hematoma without mass effect.  . History of seizure disorder     Likely 2/2 alcohol abuse  . Hypocalcemia   . Hypomagnesemia   . Failure to thrive in childhood     Unclear etiology  . HTN (hypertension)   . Thrombocytopenia ([place])   . Hepatomegaly     On exam  . Joint pain   . Alcohol abuse   . Vitamin D deficiency   . Pancreatitis   . Insomnia   . Hyperlipidemia   . Pernicious anemia   . Macrocytic anemia   . 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 Depression   . Fx humeral neck 04/17/2011    Transverse fracture- minimally displaced- managed as outpatient   . ABNORMAL PAP SMEAR, LGSIL 07/23/2008    Annotation: HPV positive CIN I Dr. Mancel Bale, [facility] Qualifier: Diagnosis of  By: Oretha Ellis    . Pneumonia 05/20/2012  . Arthritis     "shoulders" (08/15/2013)  . 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  Marland Kitchen Chronic diastolic CHF (congestive heart failure) ([place])   . Hypomagnesemia   . Seizures ([place])     "don't know when/why I had them; daughter was always there w/me"  . On home oxygen therapy     "3L; mostly at night" (06/19/2014)  .  Shortness of breath dyspnea   . Pleural effusion    Current Outpatient Prescriptions  Medication Sig Dispense Refill  . albuterol (PROAIR HFA) 108 (90 BASE) MCG/ACT inhaler Inhale 1-2 puffs into the lungs every 6 (six) hours as needed for wheezing or shortness of breath. 1 Inhaler 1  . azaTHIOprine (IMURAN) 50 MG tablet Take 50 mg by mouth daily.    . calcium carbonate (TUMS - DOSED IN MG ELEMENTAL CALCIUM) 500 MG chewable tablet Chew 1,000 tablets by mouth at bedtime.    . camphor-menthol (SARNA) lotion Apply 1 application topically daily as needed for itching.    . cetirizine (ZYRTEC) 10 MG tablet Take 1 tablet (10 mg total) by mouth daily as needed for allergies. 30 tablet 3  . CREON 12000 UNITS CPEP capsule Take 1 capsule (12,000 Units total) by mouth 3 (three) times daily before meals.  270 capsule 3  . FLUoxetine (PROZAC) 10 MG capsule Take 1 capsule (10 mg total) by mouth daily. For depression 90 capsule 3  . folic acid (FOLVITE) 1 MG tablet Take 1 tablet (1 mg total) by mouth daily. For folic acid replacement 30 tablet 6  . gabapentin (NEURONTIN) 300 MG capsule Take 1 capsule (300 mg total) by mouth daily. 90 capsule 3  . hydroxychloroquine (PLAQUENIL) 200 MG tablet Take 400 mg by mouth daily.    . Lactobacillus (ACIDOPHILUS PROBIOTIC PO) Take 2 mg by mouth 3 (three) times daily. Take 2 (two) tablets by mouth three times daily    . loperamide (IMODIUM) 2 MG capsule Take 2 mg by mouth every 6 (six) hours as needed for diarrhea or loose stools.    . mirtazapine (REMERON) 7.5 MG tablet Take 1 tablet (7.5 mg total) by mouth at bedtime. 90 tablet 3  . Multiple Vitamin (MULTIVITAMIN WITH MINERALS) TABS tablet Take 1 tablet by mouth daily. For vitamin replacement 90 tablet 3  . omeprazole (PRILOSEC) 40 MG capsule Take 1 capsule (40 mg total) by mouth daily. 90 capsule 3  . ramelteon (ROZEREM) 8 MG tablet Take 1 tablet (8 mg total) by mouth at bedtime.    . thiamine (VITAMIN B-1) 100 MG tablet Take 1 tablet (100 mg total) by mouth daily. For low thiamine 90 tablet 3  . vitamin B-12 (CYANOCOBALAMIN) 250 MCG tablet Take 1 tablet (250 mcg total) by mouth every evening. 90 tablet 3   No current facility-administered medications for this visit.   Family History  Problem Relation Age of Onset  . Cancer Mother     Died from stomach cancer and "flesh eating rash  . Heart failure Father     Died in 43s from an MI  . Alcohol abuse Sister     Twin sister drinks a lot, as did both her parents and brothers  . Stroke Brother     Has 7 brothers, 1 with CVA  . Lupus Mother    Social History   Social History  . Marital Status: Divorced    Spouse Name: N/A  . Number of Children: N/A  . Years of Education: N/A   Social History Main Topics  . Smoking status: Former Smoker -- 0.50  packs/day for 40 years    Types: Cigarettes    Quit date: 09/20/2010  . Smokeless tobacco: Never Used  . Alcohol Use: 0.0 oz/week    0 Standard drinks or equivalent per week     Comment: 12/02/14 "graduated from [employer] (for alcohol abuse) in October 2015"   .  Drug Use: Yes    Special: Marijuana, Cocaine     Comment: 12/02/14 "last drug use was in 2012"  . Sexual Activity: Not Currently   Other Topics Concern  . None   Social History Narrative   Lives with her significant other and 2 grandchildren. 1 child   Has 7 brothers and 4 sisters, 1 twin sister.   Unemployed, worked in [employer].    Abuses alcohol-drinks 1 glass of wine daily    No drug use. Former cigarette use quit 1.5 years ago.     11 th grade education            Review of Systems: CONSTITUTIONAL- gaining some weight, or change in appetite. SKIN- No Rash, colour changes or itching. HEAD- No Headache or dizziness. RESPIRATORY- No Cough or SOB. CARDIAC- No Palpitations, or chest pain. GI- No  vomiting, diarrhoea, constipation, abd pain. URINARY- denies urinary symptoms NEUROLOGIC- No Numbness, syncope, seizures or burning. [facility]- Denies depression or anxiety.  Objective:  Physical Exam: Filed Vitals:   07/13/15 1018  BP: 124/76  Pulse: 103  Temp: 98.5 F (36.9 C)  TempSrc: Oral  Weight: 83 lb 3.2 oz (37.739 kg)  SpO2: 100%   GENERAL- alert, co-operative, appears as stated age, not in any distress. HEENT- Atraumatic, normocephalic, PERRL, EOMI, oral mucosa appears moist with whitish plaque on tongue could not scrape with spatula, neck supple. CARDIAC- 3/6 systolic murmur, no murmurs, rubs or gallops. RESP- clear to auscultation bilaterally, no wheezes or crackles. ABDOMEN- Soft, nontender, bowel sounds present. BACK- Normal curvature of the spine, No tenderness along the vertebrae, no CVA tenderness. Rectal exam- hemorrhoid at 6 o clock, anal sphincter appears intact, put with insertion of  gloved finger, mild tone present, no lax, pt told to tighten sphincter- i did not fell any response, though obviously pt was trying to do this. NEURO- No obvious Cr N abnormality, strenght upper and lower extremities- 4/5 generalized,tact, Gait- Normal. EXTREMITIES- pulse 2+, symmetric, no pedal edema. SKIN- Warm, dry, No rash or lesion. PSYCH- Normal mood and affect, appropriate thought content and speech.  Assessment & Plan:  The patient's case and plan of care was discussed with attending physician, Dr. Daryll Drown.  Please see problem based charting for assessment and plan.

## 2015-07-13 NOTE — Assessment & Plan Note (Signed)
Whitish coating on tongue ?duration. Likley due to immunocompromise on immunosuppressives.  Plan-  prescription for oral nystatin swish and swallow.

## 2015-07-14 DIAGNOSIS — D688 Other specified coagulation defects: Secondary | ICD-10-CM | POA: Diagnosis not present

## 2015-07-14 DIAGNOSIS — D631 Anemia in chronic kidney disease: Secondary | ICD-10-CM | POA: Diagnosis not present

## 2015-07-14 DIAGNOSIS — N186 End stage renal disease: Secondary | ICD-10-CM | POA: Diagnosis not present

## 2015-07-14 DIAGNOSIS — E876 Hypokalemia: Secondary | ICD-10-CM | POA: Diagnosis not present

## 2015-07-14 DIAGNOSIS — R197 Diarrhea, unspecified: Secondary | ICD-10-CM | POA: Diagnosis not present

## 2015-07-14 LAB — CBC WITH DIFFERENTIAL/PLATELET
BASOS: 1 %
Basophils Absolute: 0 10*3/uL (ref 0.0–0.2)
EOS (ABSOLUTE): 0 10*3/uL (ref 0.0–0.4)
Eos: 0 %
HEMATOCRIT: 30.7 % — AB (ref 34.0–46.6)
Hemoglobin: 9.9 g/dL — ABNORMAL LOW (ref 11.1–15.9)
IMMATURE GRANS (ABS): 0 10*3/uL (ref 0.0–0.1)
IMMATURE GRANULOCYTES: 0 %
LYMPHS: 36 %
Lymphocytes Absolute: 1.4 10*3/uL (ref 0.7–3.1)
MCH: 32.8 pg (ref 26.6–33.0)
MCHC: 32.2 g/dL (ref 31.5–35.7)
MCV: 102 fL — AB (ref 79–97)
MONOCYTES: 23 %
Monocytes Absolute: 0.9 10*3/uL (ref 0.1–0.9)
NEUTROS ABS: 1.5 10*3/uL (ref 1.4–7.0)
NEUTROS PCT: 40 %
PLATELETS: 137 10*3/uL — AB (ref 150–379)
RBC: 3.02 x10E6/uL — ABNORMAL LOW (ref 3.77–5.28)
RDW: 17.1 % — ABNORMAL HIGH (ref 12.3–15.4)
WBC: 3.8 10*3/uL (ref 3.4–10.8)

## 2015-07-15 ENCOUNTER — Ambulatory Visit (HOSPITAL_COMMUNITY)
Admission: RE | Admit: 2015-07-15 | Discharge: 2015-07-15 | Disposition: A | Discharge status: Home / Self Care | Payer: Medicare Other | Source: Ambulatory Visit | Attending: Internal Medicine | Admitting: Internal Medicine

## 2015-07-15 ENCOUNTER — Encounter (HOSPITAL_COMMUNITY): Payer: Self-pay

## 2015-07-15 DIAGNOSIS — R938 Abnormal findings on diagnostic imaging of other specified body structures: Secondary | ICD-10-CM | POA: Insufficient documentation

## 2015-07-15 DIAGNOSIS — R509 Fever, unspecified: Secondary | ICD-10-CM | POA: Insufficient documentation

## 2015-07-15 DIAGNOSIS — R61 Generalized hyperhidrosis: Secondary | ICD-10-CM | POA: Diagnosis not present

## 2015-07-15 DIAGNOSIS — R159 Full incontinence of feces: Secondary | ICD-10-CM | POA: Insufficient documentation

## 2015-07-15 DIAGNOSIS — K861 Other chronic pancreatitis: Secondary | ICD-10-CM | POA: Diagnosis not present

## 2015-07-15 DIAGNOSIS — R200 Anesthesia of skin: Secondary | ICD-10-CM | POA: Insufficient documentation

## 2015-07-15 HISTORY — DX: Disorder of kidney and ureter, unspecified: N28.9

## 2015-07-15 MED ORDER — IOHEXOL 300 MG/ML  SOLN
70.0000 mL | Freq: Once | INTRAMUSCULAR | Status: AC | PRN
  Administered 2015-07-15: 70 mL via INTRAVENOUS

## 2015-07-15 NOTE — Addendum Note (Signed)
Addended by: Bethena Roys on: 07/15/2015 06:30 PM   Modules accepted: Orders

## 2015-07-16 ENCOUNTER — Other Ambulatory Visit: Payer: Self-pay

## 2015-07-16 ENCOUNTER — Emergency Department (HOSPITAL_COMMUNITY): Payer: Medicare Other

## 2015-07-16 ENCOUNTER — Emergency Department (HOSPITAL_COMMUNITY)
Admission: EM | Admit: 2015-07-16 | Discharge: 2015-07-16 | Disposition: A | Discharge status: Home / Self Care | Payer: Medicare Other | Attending: Emergency Medicine | Admitting: Emergency Medicine

## 2015-07-16 ENCOUNTER — Telehealth: Payer: Self-pay | Admitting: *Deleted

## 2015-07-16 ENCOUNTER — Encounter (HOSPITAL_COMMUNITY): Payer: Self-pay | Admitting: Vascular Surgery

## 2015-07-16 DIAGNOSIS — Z8701 Personal history of pneumonia (recurrent): Secondary | ICD-10-CM | POA: Diagnosis not present

## 2015-07-16 DIAGNOSIS — Z87828 Personal history of other (healed) physical injury and trauma: Secondary | ICD-10-CM | POA: Insufficient documentation

## 2015-07-16 DIAGNOSIS — K859 Acute pancreatitis without necrosis or infection, unspecified: Secondary | ICD-10-CM | POA: Diagnosis not present

## 2015-07-16 DIAGNOSIS — Z79899 Other long term (current) drug therapy: Secondary | ICD-10-CM | POA: Diagnosis not present

## 2015-07-16 DIAGNOSIS — Z8611 Personal history of tuberculosis: Secondary | ICD-10-CM | POA: Diagnosis not present

## 2015-07-16 DIAGNOSIS — I129 Hypertensive chronic kidney disease with stage 1 through stage 4 chronic kidney disease, or unspecified chronic kidney disease: Secondary | ICD-10-CM | POA: Diagnosis not present

## 2015-07-16 DIAGNOSIS — M19012 Primary osteoarthritis, left shoulder: Secondary | ICD-10-CM | POA: Diagnosis not present

## 2015-07-16 DIAGNOSIS — Z8709 Personal history of other diseases of the respiratory system: Secondary | ICD-10-CM | POA: Diagnosis not present

## 2015-07-16 DIAGNOSIS — M19011 Primary osteoarthritis, right shoulder: Secondary | ICD-10-CM | POA: Insufficient documentation

## 2015-07-16 DIAGNOSIS — K219 Gastro-esophageal reflux disease without esophagitis: Secondary | ICD-10-CM | POA: Insufficient documentation

## 2015-07-16 DIAGNOSIS — Z8639 Personal history of other endocrine, nutritional and metabolic disease: Secondary | ICD-10-CM | POA: Insufficient documentation

## 2015-07-16 DIAGNOSIS — R569 Unspecified convulsions: Secondary | ICD-10-CM | POA: Insufficient documentation

## 2015-07-16 DIAGNOSIS — Z992 Dependence on renal dialysis: Secondary | ICD-10-CM | POA: Insufficient documentation

## 2015-07-16 DIAGNOSIS — R32 Unspecified urinary incontinence: Secondary | ICD-10-CM | POA: Diagnosis not present

## 2015-07-16 DIAGNOSIS — R509 Fever, unspecified: Secondary | ICD-10-CM | POA: Diagnosis not present

## 2015-07-16 DIAGNOSIS — Z87891 Personal history of nicotine dependence: Secondary | ICD-10-CM | POA: Insufficient documentation

## 2015-07-16 DIAGNOSIS — R1011 Right upper quadrant pain: Secondary | ICD-10-CM

## 2015-07-16 DIAGNOSIS — F329 Major depressive disorder, single episode, unspecified: Secondary | ICD-10-CM | POA: Diagnosis not present

## 2015-07-16 DIAGNOSIS — G4700 Insomnia, unspecified: Secondary | ICD-10-CM | POA: Insufficient documentation

## 2015-07-16 DIAGNOSIS — D519 Vitamin B12 deficiency anemia, unspecified: Secondary | ICD-10-CM | POA: Diagnosis not present

## 2015-07-16 DIAGNOSIS — I5032 Chronic diastolic (congestive) heart failure: Secondary | ICD-10-CM | POA: Insufficient documentation

## 2015-07-16 DIAGNOSIS — N183 Chronic kidney disease, stage 3 (moderate): Secondary | ICD-10-CM | POA: Insufficient documentation

## 2015-07-16 DIAGNOSIS — Z9981 Dependence on supplemental oxygen: Secondary | ICD-10-CM | POA: Insufficient documentation

## 2015-07-16 DIAGNOSIS — Z87448 Personal history of other diseases of urinary system: Secondary | ICD-10-CM | POA: Insufficient documentation

## 2015-07-16 DIAGNOSIS — R4182 Altered mental status, unspecified: Secondary | ICD-10-CM | POA: Diagnosis present

## 2015-07-16 DIAGNOSIS — G40909 Epilepsy, unspecified, not intractable, without status epilepticus: Secondary | ICD-10-CM | POA: Diagnosis not present

## 2015-07-16 LAB — COMPREHENSIVE METABOLIC PANEL
ALK PHOS: 468 U/L — AB (ref 38–126)
ALT: 15 U/L (ref 14–54)
ANION GAP: 12 (ref 5–15)
AST: 85 U/L — ABNORMAL HIGH (ref 15–41)
Albumin: 1.6 g/dL — ABNORMAL LOW (ref 3.5–5.0)
BILIRUBIN TOTAL: 0.7 mg/dL (ref 0.3–1.2)
BUN: 9 mg/dL (ref 6–20)
CALCIUM: 7.8 mg/dL — AB (ref 8.9–10.3)
CO2: 24 mmol/L (ref 22–32)
CREATININE: 3.27 mg/dL — AB (ref 0.44–1.00)
Chloride: 101 mmol/L (ref 101–111)
GFR, EST AFRICAN AMERICAN: 17 mL/min — AB (ref >60)
GFR, EST NON AFRICAN AMERICAN: 15 mL/min — AB (ref >60)
Glucose, Bld: 107 mg/dL — ABNORMAL HIGH (ref 65–99)
Potassium: 3.4 mmol/L — ABNORMAL LOW (ref 3.5–5.1)
SODIUM: 137 mmol/L (ref 135–145)
TOTAL PROTEIN: 7.6 g/dL (ref 6.5–8.1)

## 2015-07-16 LAB — RAPID URINE DRUG SCREEN, HOSP PERFORMED
Amphetamines: NOT DETECTED
Barbiturates: NOT DETECTED
Benzodiazepines: NOT DETECTED
COCAINE: NOT DETECTED
OPIATES: NOT DETECTED
TETRAHYDROCANNABINOL: NOT DETECTED

## 2015-07-16 LAB — URINALYSIS, ROUTINE W REFLEX MICROSCOPIC
Bilirubin Urine: NEGATIVE
Glucose, UA: NEGATIVE mg/dL
KETONES UR: NEGATIVE mg/dL
NITRITE: NEGATIVE
PH: 7.5 (ref 5.0–8.0)
PROTEIN: 100 mg/dL — AB
Specific Gravity, Urine: 1.009 (ref 1.005–1.030)

## 2015-07-16 LAB — LIPASE, BLOOD: LIPASE: 15 U/L (ref 11–51)

## 2015-07-16 LAB — CBC
HCT: 32.2 % — ABNORMAL LOW (ref 36.0–46.0)
HEMOGLOBIN: 10.5 g/dL — AB (ref 12.0–15.0)
MCH: 34 pg (ref 26.0–34.0)
MCHC: 32.6 g/dL (ref 30.0–36.0)
MCV: 104.2 fL — AB (ref 78.0–100.0)
Platelets: 121 10*3/uL — ABNORMAL LOW (ref 150–400)
RBC: 3.09 MIL/uL — ABNORMAL LOW (ref 3.87–5.11)
RDW: 18.1 % — ABNORMAL HIGH (ref 11.5–15.5)
WBC: 5.3 10*3/uL (ref 4.0–10.5)

## 2015-07-16 LAB — URINE MICROSCOPIC-ADD ON

## 2015-07-16 MED ORDER — LEVETIRACETAM 250 MG PO TABS: 250.0000 mg | ORAL_TABLET | Freq: Every day | ORAL | Status: DC

## 2015-07-16 MED ORDER — LEVETIRACETAM 250 MG PO TABS
250.0000 mg | ORAL_TABLET | Freq: Once | ORAL | Status: AC
Start: 2015-07-16 — End: 2015-07-16
  Administered 2015-07-16: 250 mg via ORAL
  Filled 2015-07-16: qty 1

## 2015-07-16 NOTE — ED Notes (Signed)
Pt does not make urine per daughter. ED PA made aware and states to d/c orders.

## 2015-07-16 NOTE — ED Notes (Signed)
Pt placed into gown and on to monitor. Pt monitored by blood pressure, pulse ox, and 12 lead. Pt has seizure pads in place.

## 2015-07-16 NOTE — Telephone Encounter (Signed)
I had already called patients home earlier this morning, to find out why she did not go to the Ed last night. Was told she was on her way to the hopsital. She is in the ED. Thanks [employer].  Ejiro.

## 2015-07-16 NOTE — ED Notes (Signed)
Pt's daughter wants to be updated with plan of care Deshai Cruver- [phone]

## 2015-07-16 NOTE — Telephone Encounter (Signed)
Walk in, [name] [name] daughter and HIPPA approved comes in today to report that her mother-Pt- is noncompliant with medications, drinking at least 1 pint of hard liquor daily , non compliant with dialysis, she suspects seizures several times daily. She took pt to ED this am and pt is not able to communicate completely language seems gargled. She is very concerned for pt. [phone] please call [name] [name], she is upset.

## 2015-07-16 NOTE — ED Notes (Signed)
Pt reports to the ED for eval of an episode of unresponsiveness. Pts family member reports she was up walking around this am but when she got in the car she went unresponsive and shaking. She has hx of seizures. Pt is still lethargic. Denies any oral trauma but did have a small amount of urinary incontinence. Pt is a dialysis patient and her last dialysis was on Wednesday. She had her full treatment. Pt was on her way to get an U/S in internal medicine clinic. Pt oriented x 4 at this time. Pt denies any pain.

## 2015-07-16 NOTE — ED Provider Notes (Signed)
CSN: [id number]     Arrival date & time 07/16/15  1008 History   First MD Initiated Contact with Patient 07/16/15 1030     Chief Complaint  Patient presents with  . Altered Mental Status   (Consider location/radiation/quality/duration/timing/severity/associated sxs/prior Treatment) HPI 60 y.o. female with a hx of seizures, ETOH abuse, Pancreatitis ESRD, HTN, presents to the Emergency Department today after a witnessed seizure this morning. The daughter states that the patient was on her way to her U/S appointment when she started having seizure like activity in the passenger seat of the car. Daughter states that her mother was jerking back and forth in the seat 1-2 min. Pt does not remember the incident. Notes urinary incontinence, but not oral trauma. Pt scheduled to have RUQ limited US of gallbladder due to incidental finding of possible acute chole on Lumbar CT. Pt notes RUQ pain as well. No N/V/D. No fevers. No CP/SOB/ABD pain. Pt does feel weak. Last dialysis treatment was on Wednesday, scheduled to have one today. No other symptoms noted.    Past Medical History  Diagnosis Date  . Anemia, B12 deficiency   . History of acute pancreatitis   . Right knee pain     No recent imaging on chart  . 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 Dr. Mancel Bale, [facility]  . Hypertriglyceridemia   . GERD (gastroesophageal reflux disease)   . Subdural hematoma ([place]) 02/2008    Likely 2/2 trauma from seizure from EtOH withdrawal, chronic in nature, sees Dr. Jerene Bears. Most recent CT head 10/2009 showing stable but persistent hematoma without mass effect.  . History of seizure disorder     Likely 2/2 alcohol abuse  . Hypocalcemia   . Hypomagnesemia   . Failure to thrive in childhood     Unclear etiology  . HTN (hypertension)   . Thrombocytopenia ([place])   . Hepatomegaly     On exam  . Joint pain   . Alcohol abuse   . Vitamin D  deficiency   . Pancreatitis   . Insomnia   . Hyperlipidemia   . Pernicious anemia   . Macrocytic anemia   . 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 Depression   . Fx humeral neck 04/17/2011    Transverse fracture- minimally displaced- managed as outpatient   . ABNORMAL PAP SMEAR, LGSIL 07/23/2008    Annotation: HPV positive CIN I Dr. Mancel Bale, [facility] Qualifier: Diagnosis of  By: Oretha Ellis    . Pneumonia 05/20/2012  . Arthritis     "shoulders" (08/15/2013)  . 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  Marland Kitchen Chronic diastolic CHF (congestive heart failure) ([place])   . Hypomagnesemia   . Seizures ([place])     "don't know when/why I had them; daughter was always there w/me"  . On home oxygen therapy     "3L; mostly at night" (06/19/2014)  . Shortness of breath dyspnea   . Pleural effusion   . Renal insufficiency    Past Surgical History  Procedure Laterality Date  . Cesarean section  1983  . Esophagogastroduodenoscopy  07/11/2011    Procedure: ESOPHAGOGASTRODUODENOSCOPY (EGD);  Surgeon: Beryle Beams, MD;  Location: [facility];  Service: Endoscopy;  Laterality: N/A;  . Colonoscopy  07/11/2011    Procedure: COLONOSCOPY;  Surgeon: Beryle Beams, MD;  Location: [facility] ENDOSCOPY;  Service: Endoscopy;  Laterality: N/A;  . Eye surgery Left     "trauma"  . Right colectomy  08/28/2011  . Esophagogastroduodenoscopy N/A 12/01/2012    Procedure: ESOPHAGOGASTRODUODENOSCOPY (EGD);  Surgeon: Irene Shipper, MD;  Location: [facility];  Service: Endoscopy;  Laterality: N/A;  . Colonoscopy with esophagogastroduodenoscopy (egd) Left 08/21/2013    Procedure: COLONOSCOPY WITH ESOPHAGOGASTRODUODENOSCOPY (EGD);  Surgeon: Beryle Beams, MD;  Location: [facility] ENDOSCOPY;  Service: Endoscopy;  Laterality: Left;  . Subxyphoid pericardial window N/A 12/04/2013    Procedure: SUBXYPHOID PERICARDIAL WINDOW WITH TEE;  Surgeon: Ivin Poot, MD;  Location: [place];   Service: Thoracic;  Laterality: N/A;  . Intraoperative transesophageal echocardiogram N/A 12/04/2013    Procedure: INTRAOPERATIVE TRANSESOPHAGEAL ECHOCARDIOGRAM;  Surgeon: Ivin Poot, MD;  Location: [place];  Service: Open Heart Surgery;  Laterality: N/A;  . Exchange of a dialysis catheter Left 10/22/2014    Procedure: EXCHANGE OF A DIALYSIS CATHETER;  Surgeon: Conrad Bradshaw, MD;  Location: [place];  Service: Vascular;  Laterality: Left;  . Av fistula placement Left 12/03/2014    Procedure: INSERTION OF ARTERIOVENOUS (AV) GORE-TEX GRAFT ARM;  Surgeon: Mal Misty, MD;  [facility];  Service: Vascular;  Laterality: Left;   Family History  Problem Relation Age of Onset  . Cancer Mother     Died from stomach cancer and "flesh eating rash  . Heart failure Father     Died in 64s from an MI  . Alcohol abuse Sister     Twin sister drinks a lot, as did both her parents and brothers  . Stroke Brother     Has 7 brothers, 1 with CVA  . Lupus Mother    Social History  Substance Use Topics  . Smoking status: Former Smoker -- 0.50 packs/day for 40 years    Types: Cigarettes    Quit date: 09/20/2010  . Smokeless tobacco: Never Used  . Alcohol Use: 0.0 oz/week    0 Standard drinks or equivalent per week     Comment: 12/02/14 "graduated from [employer] (for alcohol abuse) in October 2015"    OB History    No data available     Review of Systems ROS reviewed and all are negative for acute change except as noted in the HPI.  Allergies  Amitriptyline hcl and Doxycycline hyclate  Home Medications   Prior to Admission medications   Medication Sig Start Date End Date Taking? Authorizing Provider  albuterol (PROAIR HFA) 108 (90 BASE) MCG/ACT inhaler Inhale 1-2 puffs into the lungs every 6 (six) hours as needed for wheezing or shortness of breath. 01/14/14   Jones Bales, MD  azaTHIOprine (IMURAN) 50 MG tablet Take 50 mg by mouth daily. 06/29/15   Historical Provider, MD  calcium  carbonate (TUMS - DOSED IN MG ELEMENTAL CALCIUM) 500 MG chewable tablet Chew 1,000 tablets by mouth at bedtime.    Historical Provider, MD  camphor-menthol Timoteo Ace) lotion Apply 1 application topically daily as needed for itching.    Historical Provider, MD  cetirizine (ZYRTEC) 10 MG tablet Take 1 tablet (10 mg total) by mouth daily as needed for allergies. 06/11/15   Shela Leff, MD  CREON 12000 UNITS CPEP capsule Take 1 capsule (12,000 Units total) by mouth 3 (three) times daily before meals. 01/21/15   Shela Leff, MD  FLUoxetine (PROZAC) 10 MG capsule Take 1 capsule (10 mg total) by mouth daily. For depression 01/21/15   Shela Leff, MD  folic acid (FOLVITE) 1 MG tablet Take 1 tablet (1 mg total) by mouth daily. For folic acid replacement 06/08/15   Shela Leff, MD  gabapentin (NEURONTIN) 300 MG capsule Take 1 capsule (300 mg total) by mouth daily. 04/09/15 04/06/16  Shela Leff, MD  hydroxychloroquine (PLAQUENIL) 200 MG tablet Take 400 mg by mouth daily.    Historical Provider, MD  Lactobacillus (ACIDOPHILUS PROBIOTIC PO) Take 2 mg by mouth 3 (three) times daily. Take 2 (two) tablets by mouth three times daily    Historical Provider, MD  loperamide (IMODIUM) 2 MG capsule Take 2 mg by mouth every 6 (six) hours as needed for diarrhea or loose stools.    Historical Provider, MD  mirtazapine (REMERON) 7.5 MG tablet Take 1 tablet (7.5 mg total) by mouth at bedtime. 01/21/15   Shela Leff, MD  Multiple Vitamin (MULTIVITAMIN WITH MINERALS) TABS tablet Take 1 tablet by mouth daily. For vitamin replacement 03/24/15   Shela Leff, MD  nystatin (MYCOSTATIN) 100000 UNIT/ML suspension Take 5 mLs (500,000 Units total) by mouth 4 (four) times daily. 07/13/15   Ejiroghene Arlyce Dice, MD  omeprazole (PRILOSEC) 40 MG capsule Take 1 capsule (40 mg total) by mouth daily. 01/21/15   Shela Leff, MD  ramelteon (ROZEREM) 8 MG tablet Take 1 tablet (8 mg total) by mouth at bedtime.  01/21/15   Shela Leff, MD  thiamine (VITAMIN B-1) 100 MG tablet Take 1 tablet (100 mg total) by mouth daily. For low thiamine 01/21/15   Shela Leff, MD  vitamin B-12 (CYANOCOBALAMIN) 250 MCG tablet Take 1 tablet (250 mcg total) by mouth every evening. 01/21/15   Shela Leff, MD   There were no vitals taken for this visit.   Physical Exam  Constitutional: She is oriented to person, place, and time. She appears well-developed and well-nourished.  HENT:  Head: Normocephalic and atraumatic.  Eyes: EOM are normal. Pupils are equal, round, and reactive to light.  Neck: Normal range of motion. Neck supple.  Cardiovascular: Normal rate, regular rhythm and normal heart sounds.   Pulmonary/Chest: Effort normal and breath sounds normal.  Abdominal: Soft. Normal appearance and bowel sounds are normal. There is tenderness in the right upper quadrant. There is positive Murphy's sign. There is no rebound, no guarding, no CVA tenderness and no tenderness at McBurney's point.  Musculoskeletal: Normal range of motion.  Left AV Brachiocephalic Fistula. Good thrill.    Neurological: She is alert and oriented to person, place, and time. She has normal strength. No cranial nerve deficit or sensory deficit.  Skin: Skin is warm and dry.  Psychiatric: She has a normal mood and affect. Her behavior is normal. Thought content normal.  Nursing note and vitals reviewed.  ED Course  Procedures (including critical care time) Labs Review Labs Reviewed  CBC - Abnormal; Notable for the following:    RBC 3.09 (*)    Hemoglobin 10.5 (*)    HCT 32.2 (*)    MCV 104.2 (*)    RDW 18.1 (*)    Platelets 121 (*)    All other components within normal limits  COMPREHENSIVE METABOLIC PANEL - Abnormal; Notable for the following:    Potassium 3.4 (*)    Glucose, Bld 107 (*)    Creatinine, Ser 3.27 (*)    Calcium 7.8 (*)    Albumin 1.6 (*)    AST 85 (*)    Alkaline Phosphatase 468 (*)    GFR calc non Af  Amer 15 (*)    GFR  calc Af Amer 17 (*)    All other components within normal limits  URINALYSIS, ROUTINE W REFLEX MICROSCOPIC (NOT AT [facility]) - Abnormal; Notable for the following:    Hgb urine dipstick TRACE (*)    Protein, ur 100 (*)    Leukocytes, UA SMALL (*)    All other components within normal limits  URINE MICROSCOPIC-ADD ON - Abnormal; Notable for the following:    Squamous Epithelial / LPF 0-5 (*)    Bacteria, UA MANY (*)    All other components within normal limits  URINE CULTURE  LIPASE, BLOOD  URINE RAPID DRUG SCREEN, HOSP PERFORMED    Imaging Review Ct Head Wo Contrast  07/16/2015  CLINICAL DATA:  Seizure today. EXAM: CT HEAD WITHOUT CONTRAST TECHNIQUE: Contiguous axial images were obtained from the base of the skull through the vertex without intravenous contrast. COMPARISON:  05/09/2015 FINDINGS: The brainstem, cerebellum, cerebral peduncles, thalami, basal ganglia, basilar cisterns, and ventricular system appear within normal limits. Periventricular white matter and corona radiata hypodensities favor chronic ischemic microvascular white matter disease. Mild cerebral atrophy for age. No intracranial hemorrhage, mass lesion, or acute CVA. Frothy material in the left sphenoid sinus. Aplastic frontal sinuses. IMPRESSION: 1. Persistent frothy material in the left sphenoid sinus favoring chronic sinusitis. 2. No acute intracranial findings. 3. Periventricular white matter and corona radiata hypodensities favor chronic ischemic microvascular white matter disease. 4. Mild cerebral atrophy for age. Electronically Signed   By: Van Clines M.D.   On: 07/16/2015 14:40   Ct Lumbar Spine W Contrast  07/15/2015  ADDENDUM REPORT: 07/15/2015 15:32 ADDENDUM: Study discussed by telephone with Dr. Denton Brick (for Dr. Gilles Chiquito ) on 07/15/2015 at 1530 hours. Electronically Signed   By: Genevie Ann M.D.   On: 07/15/2015 15:32  07/15/2015  CLINICAL DATA:  60 year old female with fever of unknown  origin, fecal incontinence, numbness radiating down both lower extremities. Immunocompromised. Initial encounter. EXAM: CT LUMBAR SPINE WITH CONTRAST TECHNIQUE: Multidetector CT imaging of the lumbar spine was performed with intravenous contrast administration. Multiplanar CT image reconstructions were also generated. CONTRAST:  26mL OMNIPAQUE IOHEXOL 300 MG/ML  SOLN COMPARISON:  CT Abdomen and Pelvis 07/13/2011 FINDINGS: Chronic calcific pancreatitis. The gallbladder today is abnormal with wall thickening and/or abundant pericholecystic fluid. Similar fluid and stranding at the porta hepatis. No definite CBD enlargement. There is a right abdominal neo terminal ileum which appears to be new. Other visible abdominal viscera are stable since 2013. Portal venous system appears patent. Aortoiliac calcified atherosclerosis noted. Visualized major arterial structures are patent. No pericardial or pleural effusion.  Negative visualized lung bases. Normal lumbar segmentation. Preserved vertebral height and alignment. Nonspecific mildly sclerotic an heterogeneous bone mineralization diffusely. Visible sacrum and SI joints intact. No acute osseous abnormality identified. No lower thoracic or lumbar spinal stenosis. Normal CT appearance of the visible epidural space. No paraspinal inflammatory stranding. IMPRESSION: 1. Abnormal gallbladder with inflammation extending to the porta hepatis. Consider acute cholecystitis and recommend follow-up imaging with either abdomen ultrasound or CT Abdomen and Pelvis (oral and IV contrast preferred). 2. No acute findings by CT in the lumbar spine. 3. Chronic calcific pancreatitis. Electronically Signed: By: Genevie Ann M.D. On: 07/15/2015 15:18   US Abdomen Limited Ruq  07/16/2015  CLINICAL DATA:  Fever. History of chronic pancreatitis. Initial encounter. EXAM: US ABDOMEN LIMITED - RIGHT UPPER QUADRANT COMPARISON:  Abdominal ultrasound 06/30/2013. FINDINGS: Gallbladder: A sludge ball is seen  within the gallbladder measuring 2 cm in diameter. No stones, wall  thickening or pericholecystic fluid identified. Stock reports negative Murphy's sign. Common bile duct: Diameter: 0.3 cm. Liver: No focal lesion identified. Within normal limits in parenchymal echogenicity. IMPRESSION: 2 cm sludge ball within the gallbladder. Negative for stones or cholecystitis. Electronically Signed   By: Inge Rise M.D.   On: 07/16/2015 12:48   I have personally reviewed and evaluated these images and lab results as part of my medical decision-making.   EKG Interpretation   Date/Time:  Friday July 16 2015 11:30:24 EST Ventricular Rate:  86 PR Interval:  197 QRS Duration: 85 QT Interval:  419 QTC Calculation: 501 R Axis:   33 Text Interpretation:  Sinus rhythm Consider left ventricular hypertrophy  Anterior Q waves, possibly due to LVH Nonspecific T abnormalities, lateral  leads No significant change since prior  Confirmed by LIU MD, DANA [id number])  on 07/16/2015 1:20:23 PM      MDM  I have reviewed relevant laboratory values.I have reviewed relevant imaging studies.I personally interpreted the relevant EKG.I have reviewed the relevant previous healthcare records.I have reviewed EMS Documentation.I obtained HPI from historian. Patient discussed with supervising physician  ED Course:  Assessment: 41y F hx of seizures, ESRD, HTN presents after a witnessed seizure on her way to her internal medicine appointment on follow up for an incidental finding on CT of inflammed gallbladder. She was scheduled for RUQ ultrasound today. Result neg. Seizure episode was in car with daughter. Does not take any medications currently for seizure. No f/u from previous seizure activity 1 year and a half ago. Labs otherwise unremarkable. CT head showed not acute abnormalities. Consult with Neurology recommended Keppra 250 qd with additional 500 after Dialysis treatments. Will have pt follow up outpatient with Neurology  for EEG.  Patient is in no acute distress. Vital Signs are stable. Patient is able to ambulate. Patient able to tolerate PO.   Disposition/Plan:  DC Home Additional Verbal discharge instructions given and discussed with patient.  Pt Instructed to f/u with Neurology as outpatient for seizure like activity  Return precautions given Pt acknowledges and agrees with plan  Supervising Physician Forde Dandy, MD   Final diagnoses:  RUQ pain  Seizure-like activity [facility])       Shary Decamp, PA-C 07/16/15 [place] Liu, MD 07/16/15 1726

## 2015-07-16 NOTE — Discharge Instructions (Signed)
Please read and follow all provided instructions.  Your diagnoses today include:  1. Seizure-like activity ([place])   2. RUQ pain     Tests performed today include:  Vital signs. See below for your results today.   Medications prescribed:   Keppra. Take as Prescribed.   Home care instructions:  Follow any educational materials contained in this packet.  Follow-up instructions: Please follow-up with Neurology. Call and make an appointment as soon as you can  Return instructions:   Please return to the Emergency Department if you do not get better, if you get worse, or new symptoms OR  - Fever (temperature greater than 101.65F)  - Bleeding that does not stop with holding pressure to the area    -Severe pain (please note that you may be more sore the day after your accident)  - Chest Pain  - Difficulty breathing  - Severe nausea or vomiting  - Inability to tolerate food and liquids  - Passing out  - Skin becoming red around your wounds  - Change in mental status (confusion or lethargy)  - New numbness or weakness     Please return if you have any other emergent concerns.  Additional Information:  Your vital signs today were: BP 119/75 mmHg   Pulse 95   Resp 20   SpO2 92% If your blood pressure (BP) was elevated above 135/85 this visit, please have this repeated by your doctor within one month. ---------------

## 2015-07-17 DIAGNOSIS — D631 Anemia in chronic kidney disease: Secondary | ICD-10-CM | POA: Diagnosis not present

## 2015-07-17 DIAGNOSIS — R197 Diarrhea, unspecified: Secondary | ICD-10-CM | POA: Diagnosis not present

## 2015-07-17 DIAGNOSIS — D688 Other specified coagulation defects: Secondary | ICD-10-CM | POA: Diagnosis not present

## 2015-07-17 DIAGNOSIS — E876 Hypokalemia: Secondary | ICD-10-CM | POA: Diagnosis not present

## 2015-07-17 DIAGNOSIS — N186 End stage renal disease: Secondary | ICD-10-CM | POA: Diagnosis not present

## 2015-07-18 LAB — URINE CULTURE
Culture: >=100000
Special Requests: NORMAL

## 2015-07-19 ENCOUNTER — Telehealth (HOSPITAL_COMMUNITY): Payer: Self-pay

## 2015-07-19 DIAGNOSIS — R197 Diarrhea, unspecified: Secondary | ICD-10-CM | POA: Diagnosis not present

## 2015-07-19 DIAGNOSIS — D631 Anemia in chronic kidney disease: Secondary | ICD-10-CM | POA: Diagnosis not present

## 2015-07-19 DIAGNOSIS — N186 End stage renal disease: Secondary | ICD-10-CM | POA: Diagnosis not present

## 2015-07-19 DIAGNOSIS — E876 Hypokalemia: Secondary | ICD-10-CM | POA: Diagnosis not present

## 2015-07-19 DIAGNOSIS — D688 Other specified coagulation defects: Secondary | ICD-10-CM | POA: Diagnosis not present

## 2015-07-19 NOTE — Progress Notes (Signed)
Internal Medicine Clinic Attending  Case discussed with Dr. Emokpae at the time of the visit.  We reviewed the resident's history and exam and pertinent patient test results.  I agree with the assessment, diagnosis, and plan of care documented in the resident's note.  

## 2015-07-19 NOTE — Telephone Encounter (Signed)
Post ED Visit - Positive Culture Follow-up  Culture report reviewed by antimicrobial stewardship pharmacist:  []  Elenor Quinones, Pharm.D. []  Heide Guile, Pharm.D., BCPS []  Parks Neptune, Pharm.D. [x]  Alycia Rossetti, Pharm.D., BCPS []  Bedias, Pharm.D., BCPS, AAHIVP []  Legrand Como, Pharm.D., BCPS, AAHIVP []  Milus Glazier, Pharm.D. []  Stephens November, Pharm.D.  Positive urine culture, >/= 100,000 colonies -> E Coli Chart reviewed by B. Cartner PA "No treatment for UTI, clinically colonized, Afeb,WBC wnl"  Dortha Kern 07/19/2015, 9:04 PM

## 2015-07-21 DIAGNOSIS — N186 End stage renal disease: Secondary | ICD-10-CM | POA: Diagnosis not present

## 2015-07-21 DIAGNOSIS — D688 Other specified coagulation defects: Secondary | ICD-10-CM | POA: Diagnosis not present

## 2015-07-21 DIAGNOSIS — R197 Diarrhea, unspecified: Secondary | ICD-10-CM | POA: Diagnosis not present

## 2015-07-21 DIAGNOSIS — D631 Anemia in chronic kidney disease: Secondary | ICD-10-CM | POA: Diagnosis not present

## 2015-07-21 DIAGNOSIS — E876 Hypokalemia: Secondary | ICD-10-CM | POA: Diagnosis not present

## 2015-07-22 NOTE — Addendum Note (Signed)
Addended by: Bethena Roys on: 07/22/2015 11:21 PM   Modules accepted: Orders

## 2015-07-23 ENCOUNTER — Encounter (HOSPITAL_COMMUNITY): Payer: Self-pay

## 2015-07-23 ENCOUNTER — Emergency Department (HOSPITAL_COMMUNITY)
Admission: EM | Admit: 2015-07-23 | Discharge: 2015-07-23 | Disposition: A | Discharge status: Home / Self Care | Payer: Medicare Other | Attending: Emergency Medicine | Admitting: Emergency Medicine

## 2015-07-23 DIAGNOSIS — R531 Weakness: Secondary | ICD-10-CM | POA: Diagnosis not present

## 2015-07-23 DIAGNOSIS — K829 Disease of gallbladder, unspecified: Secondary | ICD-10-CM | POA: Insufficient documentation

## 2015-07-23 DIAGNOSIS — Z8701 Personal history of pneumonia (recurrent): Secondary | ICD-10-CM | POA: Insufficient documentation

## 2015-07-23 DIAGNOSIS — G4700 Insomnia, unspecified: Secondary | ICD-10-CM | POA: Insufficient documentation

## 2015-07-23 DIAGNOSIS — F329 Major depressive disorder, single episode, unspecified: Secondary | ICD-10-CM | POA: Insufficient documentation

## 2015-07-23 DIAGNOSIS — Z8611 Personal history of tuberculosis: Secondary | ICD-10-CM | POA: Insufficient documentation

## 2015-07-23 DIAGNOSIS — K219 Gastro-esophageal reflux disease without esophagitis: Secondary | ICD-10-CM | POA: Insufficient documentation

## 2015-07-23 DIAGNOSIS — N186 End stage renal disease: Secondary | ICD-10-CM | POA: Insufficient documentation

## 2015-07-23 DIAGNOSIS — G40909 Epilepsy, unspecified, not intractable, without status epilepticus: Secondary | ICD-10-CM | POA: Diagnosis not present

## 2015-07-23 DIAGNOSIS — M19012 Primary osteoarthritis, left shoulder: Secondary | ICD-10-CM | POA: Insufficient documentation

## 2015-07-23 DIAGNOSIS — I959 Hypotension, unspecified: Secondary | ICD-10-CM | POA: Diagnosis not present

## 2015-07-23 DIAGNOSIS — K859 Acute pancreatitis without necrosis or infection, unspecified: Secondary | ICD-10-CM | POA: Insufficient documentation

## 2015-07-23 DIAGNOSIS — Z9981 Dependence on supplemental oxygen: Secondary | ICD-10-CM | POA: Diagnosis not present

## 2015-07-23 DIAGNOSIS — Z992 Dependence on renal dialysis: Secondary | ICD-10-CM | POA: Insufficient documentation

## 2015-07-23 DIAGNOSIS — R197 Diarrhea, unspecified: Secondary | ICD-10-CM

## 2015-07-23 DIAGNOSIS — Z8639 Personal history of other endocrine, nutritional and metabolic disease: Secondary | ICD-10-CM | POA: Diagnosis not present

## 2015-07-23 DIAGNOSIS — I120 Hypertensive chronic kidney disease with stage 5 chronic kidney disease or end stage renal disease: Secondary | ICD-10-CM | POA: Diagnosis not present

## 2015-07-23 DIAGNOSIS — Z87891 Personal history of nicotine dependence: Secondary | ICD-10-CM | POA: Diagnosis not present

## 2015-07-23 DIAGNOSIS — D519 Vitamin B12 deficiency anemia, unspecified: Secondary | ICD-10-CM | POA: Insufficient documentation

## 2015-07-23 DIAGNOSIS — M19011 Primary osteoarthritis, right shoulder: Secondary | ICD-10-CM | POA: Insufficient documentation

## 2015-07-23 DIAGNOSIS — R404 Transient alteration of awareness: Secondary | ICD-10-CM | POA: Diagnosis not present

## 2015-07-23 DIAGNOSIS — Z8781 Personal history of (healed) traumatic fracture: Secondary | ICD-10-CM | POA: Diagnosis not present

## 2015-07-23 NOTE — Discharge Instructions (Signed)
Call your kidney Center this afternoon to arrange a follow-up appointment for dialysis as soon as possible.    Chronic Kidney Disease Chronic kidney disease happens when the kidneys are damaged over a long period. The kidneys are two organs that do many important jobs in the body. These jobs include:  Removing wastes and extra fluids from the blood.  Making hormones that help to keep the body healthy.  Making sure that the body has the right amount of fluids and chemicals. Chronic kidney disease may be caused by many things. The kidney damage occurs slowly. If too much damage occurs, the kidneys may stop working the way that they should. This is dangerous. Treatment can help to slow down the damage and keep it from getting worse. HOME CARE  Follow your diet as told by your doctor. You may need to limit the amount of salt (sodium) and protein that you eat each day.  Take medicines only as told by your doctor. Do not take any new medicines unless your doctor approves it.  Quit smoking if you smoke. Talk to your doctor about programs that may help you quit smoking.  Have your blood pressure checked regularly and keep track of the results.  Start or keep doing an exercise plan.  Get shots (immunizations) as told by your doctor.  Take vitamins and minerals as told by your doctor.  Keep all follow-up visits as told by your doctor. This is important. GET HELP RIGHT AWAY IF:   Your symptoms get worse.  You have new symptoms.  You have symptoms of end-stage kidney disease. These include:  Headaches.  Skin that is darker or lighter than normal.  Numbness in the hands or feet.  Easy bruising.  Frequent hiccups.  Stopping of menstrual periods in women.  You have a fever.  You are making very little pee (urine).  You have pain or bleeding when you pee.   This information is not intended to replace advice given to you by your health care provider. Make sure you discuss any  questions you have with your health care provider.   Document Released: 08/16/2009 Document Revised: 02/10/2015 Document Reviewed: 01/19/2012 Elsevier Interactive Patient Education 2016 [place].  Diarrhea Diarrhea is frequent loose and watery bowel movements. It can cause you to feel weak and dehydrated. Dehydration can cause you to become tired and thirsty, have a dry mouth, and have decreased urination that often is dark yellow. Diarrhea is a sign of another problem, most often an infection that will not last long. In most cases, diarrhea typically lasts 2-3 days. However, it can last longer if it is a sign of something more serious. It is important to treat your diarrhea as directed by your caregiver to lessen or prevent future episodes of diarrhea. CAUSES  Some common causes include:  Gastrointestinal infections caused by viruses, bacteria, or parasites.  Food poisoning or food allergies.  Certain medicines, such as antibiotics, chemotherapy, and laxatives.  Artificial sweeteners and fructose.  Digestive disorders. HOME CARE INSTRUCTIONS  Ensure adequate fluid intake (hydration): Have 1 cup (8 oz) of fluid for each diarrhea episode. Avoid fluids that contain simple sugars or sports drinks, fruit juices, whole milk products, and sodas. Your urine should be clear or pale yellow if you are drinking enough fluids. Hydrate with an oral rehydration solution that you can purchase at pharmacies, retail stores, and online. You can prepare an oral rehydration solution at home by mixing the following ingredients together:   -  tsp table salt.   tsp baking soda.   tsp salt substitute containing potassium chloride.  1  tablespoons sugar.  1 L (34 oz) of water.  Certain foods and beverages may increase the speed at which food moves through the gastrointestinal (GI) tract. These foods and beverages should be avoided and include:  Caffeinated and alcoholic beverages.  High-fiber foods,  such as raw fruits and vegetables, nuts, seeds, and whole grain breads and cereals.  Foods and beverages sweetened with sugar alcohols, such as xylitol, sorbitol, and mannitol.  Some foods may be well tolerated and may help thicken stool including:  Starchy foods, such as rice, toast, pasta, low-sugar cereal, oatmeal, grits, baked potatoes, crackers, and bagels.  Bananas.  Applesauce.  Add probiotic-rich foods to help increase healthy bacteria in the GI tract, such as yogurt and fermented milk products.  Wash your hands well after each diarrhea episode.  Only take over-the-counter or prescription medicines as directed by your caregiver.  Take a warm bath to relieve any burning or pain from frequent diarrhea episodes. SEEK IMMEDIATE MEDICAL CARE IF:   You are unable to keep fluids down.  You have persistent vomiting.  You have blood in your stool, or your stools are black and tarry.  You do not urinate in 6-8 hours, or there is only a small amount of very dark urine.  You have abdominal pain that increases or localizes.  You have weakness, dizziness, confusion, or light-headedness.  You have a severe headache.  Your diarrhea gets worse or does not get better.  You have a fever or persistent symptoms for more than 2-3 days.  You have a fever and your symptoms suddenly get worse. MAKE SURE YOU:   Understand these instructions.  Will watch your condition.  Will get help right away if you are not doing well or get worse.   This information is not intended to replace advice given to you by your health care provider. Make sure you discuss any questions you have with your health care provider.   Document Released: 05/12/2002 Document Revised: 06/12/2014 Document Reviewed: 01/28/2012 Elsevier Interactive Patient Education [employer].

## 2015-07-23 NOTE — ED Provider Notes (Addendum)
CSN: [id number]     Arrival date & time 07/23/15  1135 History   First MD Initiated Contact with Patient 07/23/15 1153     Chief Complaint  Patient presents with  . Hypotension     (Consider location/radiation/quality/duration/timing/severity/associated sxs/prior Treatment) HPI   [name] [name] is a 60 y.o. female here for evaluation of an episode of low blood pressure, which occurred this morning at her dialysis center. On predialysis screening, her blood pressure was 80/50. At that point, the attendants elected to transfer her to the emergency department, for evaluation. Patient reports having diarrhea, twice a week for several weeks. She complains of generalized weakness, but no headache, paresthesias, nausea or vomiting. He denies fever. She has been dialyzing regularly. She is in the ED a couple weeks ago, with a seizure, and was started on Keppra. There are no other known modifying factors.  Past Medical History  Diagnosis Date  . Anemia, B12 deficiency   . History of acute pancreatitis   . Right knee pain     No recent imaging on chart  . 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 Dr. Mancel Bale, [facility]  . Hypertriglyceridemia   . GERD (gastroesophageal reflux disease)   . Subdural hematoma ([place]) 02/2008    Likely 2/2 trauma from seizure from EtOH withdrawal, chronic in nature, sees Dr. Jerene Bears. Most recent CT head 10/2009 showing stable but persistent hematoma without mass effect.  . History of seizure disorder     Likely 2/2 alcohol abuse  . Hypocalcemia   . Hypomagnesemia   . Failure to thrive in childhood     Unclear etiology  . HTN (hypertension)   . Thrombocytopenia ([place])   . Hepatomegaly     On exam  . Joint pain   . Alcohol abuse   . Vitamin D deficiency   . Pancreatitis   . Insomnia   . Hyperlipidemia   . Pernicious anemia   . Macrocytic anemia   . 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  Depression   . Fx humeral neck 04/17/2011    Transverse fracture- minimally displaced- managed as outpatient   . ABNORMAL PAP SMEAR, LGSIL 07/23/2008    Annotation: HPV positive CIN I Dr. Mancel Bale, [facility] Qualifier: Diagnosis of  By: Oretha Ellis    . Pneumonia 05/20/2012  . Arthritis     "shoulders" (08/15/2013)  . 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  Marland Kitchen Chronic diastolic CHF (congestive heart failure) ([place])   . Hypomagnesemia   . Seizures ([place])     "don't know when/why I had them; daughter was always there w/me"  . On home oxygen therapy     "3L; mostly at night" (06/19/2014)  . Shortness of breath dyspnea   . Pleural effusion   . Renal insufficiency    Past Surgical History  Procedure Laterality Date  . Cesarean section  1983  . Esophagogastroduodenoscopy  07/11/2011    Procedure: ESOPHAGOGASTRODUODENOSCOPY (EGD);  Surgeon: Beryle Beams, MD;  Location: [facility];  Service: Endoscopy;  Laterality: N/A;  . Colonoscopy  07/11/2011    Procedure: COLONOSCOPY;  Surgeon: Beryle Beams, MD;  Location: [facility] ENDOSCOPY;  Service: Endoscopy;  Laterality: N/A;  . Eye surgery Left     "trauma"  . Right colectomy  08/28/2011  . Esophagogastroduodenoscopy N/A 12/01/2012    Procedure:  ESOPHAGOGASTRODUODENOSCOPY (EGD);  Surgeon: Irene Shipper, MD;  Location: [facility];  Service: Endoscopy;  Laterality: N/A;  . Colonoscopy with esophagogastroduodenoscopy (egd) Left 08/21/2013    Procedure: COLONOSCOPY WITH ESOPHAGOGASTRODUODENOSCOPY (EGD);  Surgeon: Beryle Beams, MD;  Location: [facility] ENDOSCOPY;  Service: Endoscopy;  Laterality: Left;  . Subxyphoid pericardial window N/A 12/04/2013    Procedure: SUBXYPHOID PERICARDIAL WINDOW WITH TEE;  Surgeon: Ivin Poot, MD;  Location: [place];  Service: Thoracic;  Laterality: N/A;  . Intraoperative transesophageal echocardiogram N/A 12/04/2013    Procedure: INTRAOPERATIVE TRANSESOPHAGEAL  ECHOCARDIOGRAM;  Surgeon: Ivin Poot, MD;  Location: [place];  Service: Open Heart Surgery;  Laterality: N/A;  . Exchange of a dialysis catheter Left 10/22/2014    Procedure: EXCHANGE OF A DIALYSIS CATHETER;  Surgeon: Conrad Lastrup, MD;  Location: [place];  Service: Vascular;  Laterality: Left;  . Av fistula placement Left 12/03/2014    Procedure: INSERTION OF ARTERIOVENOUS (AV) GORE-TEX GRAFT ARM;  Surgeon: Mal Misty, MD;  Location: [facility] OR;  Service: Vascular;  Laterality: Left;   Family History  Problem Relation Age of Onset  . Cancer Mother     Died from stomach cancer and "flesh eating rash  . Heart failure Father     Died in 7s from an MI  . Alcohol abuse Sister     Twin sister drinks a lot, as did both her parents and brothers  . Stroke Brother     Has 7 brothers, 1 with CVA  . Lupus Mother    Social History  Substance Use Topics  . Smoking status: Former Smoker -- 0.50 packs/day for 40 years    Types: Cigarettes    Quit date: 09/20/2010  . Smokeless tobacco: Never Used  . Alcohol Use: 0.0 oz/week    0 Standard drinks or equivalent per week     Comment: 12/02/14 "graduated from [facility] (for alcohol abuse) in October 2015"    OB History    No data available     Review of Systems    Allergies  Amitriptyline hcl and Doxycycline hyclate  Home Medications   Prior to Admission medications   Medication Sig Start Date End Date Taking? Authorizing Provider  albuterol (PROAIR HFA) 108 (90 BASE) MCG/ACT inhaler Inhale 1-2 puffs into the lungs every 6 (six) hours as needed for wheezing or shortness of breath. 01/14/14   Jones Bales, MD  azaTHIOprine (IMURAN) 50 MG tablet Take 50 mg by mouth daily. 06/29/15   Historical Provider, MD  calcium carbonate (TUMS - DOSED IN MG ELEMENTAL CALCIUM) 500 MG chewable tablet Chew 1,000 tablets by mouth at bedtime.    Historical Provider, MD  camphor-menthol Timoteo Ace) lotion Apply 1 application topically daily as needed for  itching.    Historical Provider, MD  cetirizine (ZYRTEC) 10 MG tablet Take 1 tablet (10 mg total) by mouth daily as needed for allergies. 06/11/15   Shela Leff, MD  CREON 12000 UNITS CPEP capsule Take 1 capsule (12,000 Units total) by mouth 3 (three) times daily before meals. 01/21/15   Shela Leff, MD  FLUoxetine (PROZAC) 10 MG capsule Take 1 capsule (10 mg total) by mouth daily. For depression 01/21/15   Shela Leff, MD  folic acid (FOLVITE) 1 MG tablet Take 1 tablet (1 mg total) by mouth daily. For folic acid replacement 06/08/15   Shela Leff, MD  gabapentin (NEURONTIN) 300 MG capsule Take 1 capsule (300 mg total) by mouth daily. 04/09/15 04/06/16  Shela Leff, MD  hydroxychloroquine (PLAQUENIL) 200 MG tablet Take 400 mg by mouth daily.    Historical Provider, MD  Lactobacillus (ACIDOPHILUS PROBIOTIC PO) Take 2 mg by mouth 3 (three) times daily. Take 2 (two) tablets by mouth three times daily    Historical Provider, MD  levETIRAcetam (KEPPRA) 250 MG tablet Take 1 tablet (250 mg total) by mouth daily. Take additional 500mg  (2 tablets) after Dialysis treatments on Monday, Wednesday, Friday 07/16/15   Shary Decamp, PA-C  loperamide (IMODIUM) 2 MG capsule Take 2 mg by mouth every 6 (six) hours as needed for diarrhea or loose stools.    Historical Provider, MD  mirtazapine (REMERON) 7.5 MG tablet Take 1 tablet (7.5 mg total) by mouth at bedtime. 01/21/15   Shela Leff, MD  Multiple Vitamin (MULTIVITAMIN WITH MINERALS) TABS tablet Take 1 tablet by mouth daily. For vitamin replacement 03/24/15   Shela Leff, MD  nystatin (MYCOSTATIN) 100000 UNIT/ML suspension Take 5 mLs (500,000 Units total) by mouth 4 (four) times daily. 07/13/15   Ejiroghene Arlyce Dice, MD  omeprazole (PRILOSEC) 40 MG capsule Take 1 capsule (40 mg total) by mouth daily. 01/21/15   Shela Leff, MD  ramelteon (ROZEREM) 8 MG tablet Take 1 tablet (8 mg total) by mouth at bedtime. 01/21/15   Shela Leff,  MD  thiamine (VITAMIN B-1) 100 MG tablet Take 1 tablet (100 mg total) by mouth daily. For low thiamine 01/21/15   Shela Leff, MD  vitamin B-12 (CYANOCOBALAMIN) 250 MCG tablet Take 1 tablet (250 mcg total) by mouth every evening. 01/21/15   Shela Leff, MD   BP 117/78 mmHg  Pulse 96  Temp(Src) 97.9 F (36.6 C) (Oral)  Resp 16  SpO2 99% Physical Exam  Constitutional: She is oriented to person, place, and time. She appears well-developed.  Appears older than stated age.  HENT:  Head: Normocephalic and atraumatic.  Right Ear: External ear normal.  Left Ear: External ear normal.  Eyes: Conjunctivae and EOM are normal. Pupils are equal, round, and reactive to light.  Neck: Normal range of motion and phonation normal. Neck supple.  Cardiovascular: Normal rate, regular rhythm and normal heart sounds.   Pulmonary/Chest: Effort normal and breath sounds normal. She exhibits no bony tenderness.  Abdominal: Soft. There is no tenderness.  Musculoskeletal: Normal range of motion.  Neurological: She is alert and oriented to person, place, and time. No cranial nerve deficit or sensory deficit. She exhibits normal muscle tone. Coordination normal.  Skin: Skin is warm, dry and intact.  Psychiatric: She has a normal mood and affect. Her behavior is normal.  Nursing note and vitals reviewed.   ED Course  Procedures (including critical care time)  Medications - No data to display  Patient Vitals for the past 24 hrs:  BP Temp Temp src Pulse Resp SpO2  07/23/15 1415 117/78 mmHg - - 96 16 99 %  07/23/15 1400 114/82 mmHg - - 95 18 100 %  07/23/15 1245 112/78 mmHg - - 86 15 100 %  07/23/15 1215 115/78 mmHg - - 84 16 100 %  07/23/15 1147 116/75 mmHg 97.9 F (36.6 C) Oral 88 16 100 %  07/23/15 1145 116/75 mmHg - - 87 16 99 %  07/23/15 1144 - - - - - 100 %    13:50- discussed with nephrology, Dr. Florene Glen, who   2:04 PM Reevaluation with update and discussion. After initial assessment and  treatment, an updated evaluation reveals she is able to eat, and states that she  feels better. Orthostatic blood pressure and pulses were negative. Findings discussed with the patient and all questions were answered. [place] Review Labs Reviewed - No data to display  Imaging Review No results found. I have personally reviewed and evaluated these images and lab results as part of my medical decision-making.   EKG Interpretation None      MDM   Final diagnoses:  Hypotension, unspecified hypotension type  Gallbladder disease  Diarrhea, unspecified type  End stage renal disease ([place])    Episode of hypotension resolved with IV fluids. She is hemodynamically stable, and is able to eat in the ED. Ongoing diarrhea with finding of gallbladder sludge, on recent ultrasound imaging. Doubt acute cholecystitis at this time. Doubt metabolic instability or fluid overload.  Nursing Notes Reviewed/ Care Coordinated Applicable Imaging Reviewed Interpretation of Laboratory Data incorporated into ED treatment  The patient appears reasonably screened and/or stabilized for discharge and I doubt any other medical condition or other [facility] requiring further screening, evaluation, or treatment in the ED at this time prior to discharge.  Plan: Home Medications- usual; Home Treatments- rest; return here if the recommended treatment, does not improve the symptoms; Recommended follow up- PCP prn. Dialysis asap     Daleen Bo, MD 07/23/15 1517  Daleen Bo, MD 12/25/15 [phone]

## 2015-07-23 NOTE — ED Notes (Signed)
Pt.  BIB EMS for evaluation of hypotension. Pt. Was on way to dialysis txt today, unable to get out of transport van on her own which is unusual. Pt. States she recently had GI bug with diarrhea. Pt. States she has felt weak. Dialysis center found pt. BP to be 80/50. Pt. Given 100 ml NS by EMS. No txt completed today.

## 2015-07-26 ENCOUNTER — Other Ambulatory Visit: Payer: Self-pay | Admitting: Internal Medicine

## 2015-07-26 DIAGNOSIS — D688 Other specified coagulation defects: Secondary | ICD-10-CM | POA: Diagnosis not present

## 2015-07-26 DIAGNOSIS — E876 Hypokalemia: Secondary | ICD-10-CM | POA: Diagnosis not present

## 2015-07-26 DIAGNOSIS — N186 End stage renal disease: Secondary | ICD-10-CM | POA: Diagnosis not present

## 2015-07-26 DIAGNOSIS — R197 Diarrhea, unspecified: Secondary | ICD-10-CM | POA: Diagnosis not present

## 2015-07-26 DIAGNOSIS — J9620 Acute and chronic respiratory failure, unspecified whether with hypoxia or hypercapnia: Secondary | ICD-10-CM | POA: Diagnosis not present

## 2015-07-26 DIAGNOSIS — D631 Anemia in chronic kidney disease: Secondary | ICD-10-CM | POA: Diagnosis not present

## 2015-07-26 DIAGNOSIS — J989 Respiratory disorder, unspecified: Secondary | ICD-10-CM | POA: Diagnosis not present

## 2015-07-28 DIAGNOSIS — N186 End stage renal disease: Secondary | ICD-10-CM | POA: Diagnosis not present

## 2015-07-28 DIAGNOSIS — D688 Other specified coagulation defects: Secondary | ICD-10-CM | POA: Diagnosis not present

## 2015-07-28 DIAGNOSIS — R197 Diarrhea, unspecified: Secondary | ICD-10-CM | POA: Diagnosis not present

## 2015-07-28 DIAGNOSIS — E876 Hypokalemia: Secondary | ICD-10-CM | POA: Diagnosis not present

## 2015-07-28 DIAGNOSIS — D631 Anemia in chronic kidney disease: Secondary | ICD-10-CM | POA: Diagnosis not present

## 2015-07-30 ENCOUNTER — Observation Stay (HOSPITAL_COMMUNITY)
Admission: EM | Admit: 2015-07-30 | Discharge: 2015-08-02 | Disposition: A | Discharge status: Home / Self Care | Payer: Medicare Other | Attending: Internal Medicine | Admitting: Internal Medicine

## 2015-07-30 ENCOUNTER — Encounter (HOSPITAL_COMMUNITY): Payer: Self-pay | Admitting: *Deleted

## 2015-07-30 ENCOUNTER — Inpatient Hospital Stay (HOSPITAL_COMMUNITY): Payer: Medicare Other

## 2015-07-30 ENCOUNTER — Emergency Department (HOSPITAL_COMMUNITY): Payer: Medicare Other

## 2015-07-30 DIAGNOSIS — D638 Anemia in other chronic diseases classified elsewhere: Secondary | ICD-10-CM | POA: Diagnosis present

## 2015-07-30 DIAGNOSIS — I120 Hypertensive chronic kidney disease with stage 5 chronic kidney disease or end stage renal disease: Secondary | ICD-10-CM | POA: Insufficient documentation

## 2015-07-30 DIAGNOSIS — E872 Acidosis, unspecified: Secondary | ICD-10-CM

## 2015-07-30 DIAGNOSIS — K861 Other chronic pancreatitis: Secondary | ICD-10-CM | POA: Insufficient documentation

## 2015-07-30 DIAGNOSIS — Z9049 Acquired absence of other specified parts of digestive tract: Secondary | ICD-10-CM | POA: Diagnosis not present

## 2015-07-30 DIAGNOSIS — D696 Thrombocytopenia, unspecified: Secondary | ICD-10-CM | POA: Insufficient documentation

## 2015-07-30 DIAGNOSIS — Z87891 Personal history of nicotine dependence: Secondary | ICD-10-CM | POA: Diagnosis not present

## 2015-07-30 DIAGNOSIS — K920 Hematemesis: Secondary | ICD-10-CM

## 2015-07-30 DIAGNOSIS — A047 Enterocolitis due to Clostridium difficile: Secondary | ICD-10-CM | POA: Diagnosis not present

## 2015-07-30 DIAGNOSIS — I10 Essential (primary) hypertension: Secondary | ICD-10-CM | POA: Diagnosis present

## 2015-07-30 DIAGNOSIS — I5032 Chronic diastolic (congestive) heart failure: Secondary | ICD-10-CM | POA: Diagnosis not present

## 2015-07-30 DIAGNOSIS — F329 Major depressive disorder, single episode, unspecified: Secondary | ICD-10-CM | POA: Insufficient documentation

## 2015-07-30 DIAGNOSIS — R112 Nausea with vomiting, unspecified: Secondary | ICD-10-CM | POA: Diagnosis not present

## 2015-07-30 DIAGNOSIS — E876 Hypokalemia: Secondary | ICD-10-CM | POA: Diagnosis not present

## 2015-07-30 DIAGNOSIS — K2970 Gastritis, unspecified, without bleeding: Secondary | ICD-10-CM | POA: Diagnosis not present

## 2015-07-30 DIAGNOSIS — N390 Urinary tract infection, site not specified: Secondary | ICD-10-CM | POA: Diagnosis not present

## 2015-07-30 DIAGNOSIS — S299XXA Unspecified injury of thorax, initial encounter: Secondary | ICD-10-CM | POA: Diagnosis not present

## 2015-07-30 DIAGNOSIS — N186 End stage renal disease: Secondary | ICD-10-CM | POA: Diagnosis not present

## 2015-07-30 DIAGNOSIS — G40909 Epilepsy, unspecified, not intractable, without status epilepticus: Secondary | ICD-10-CM | POA: Diagnosis not present

## 2015-07-30 DIAGNOSIS — F1010 Alcohol abuse, uncomplicated: Secondary | ICD-10-CM | POA: Diagnosis not present

## 2015-07-30 DIAGNOSIS — M329 Systemic lupus erythematosus, unspecified: Secondary | ICD-10-CM | POA: Diagnosis not present

## 2015-07-30 DIAGNOSIS — N2581 Secondary hyperparathyroidism of renal origin: Secondary | ICD-10-CM | POA: Diagnosis not present

## 2015-07-30 DIAGNOSIS — R109 Unspecified abdominal pain: Secondary | ICD-10-CM | POA: Diagnosis not present

## 2015-07-30 DIAGNOSIS — R509 Fever, unspecified: Secondary | ICD-10-CM | POA: Diagnosis present

## 2015-07-30 DIAGNOSIS — E43 Unspecified severe protein-calorie malnutrition: Secondary | ICD-10-CM | POA: Diagnosis not present

## 2015-07-30 DIAGNOSIS — Z992 Dependence on renal dialysis: Secondary | ICD-10-CM | POA: Insufficient documentation

## 2015-07-30 DIAGNOSIS — R42 Dizziness and giddiness: Secondary | ICD-10-CM | POA: Diagnosis not present

## 2015-07-30 DIAGNOSIS — E8809 Other disorders of plasma-protein metabolism, not elsewhere classified: Secondary | ICD-10-CM | POA: Diagnosis present

## 2015-07-30 DIAGNOSIS — N051 Unspecified nephritic syndrome with focal and segmental glomerular lesions: Secondary | ICD-10-CM | POA: Diagnosis present

## 2015-07-30 DIAGNOSIS — D688 Other specified coagulation defects: Secondary | ICD-10-CM | POA: Diagnosis not present

## 2015-07-30 DIAGNOSIS — I5042 Chronic combined systolic (congestive) and diastolic (congestive) heart failure: Secondary | ICD-10-CM

## 2015-07-30 DIAGNOSIS — R197 Diarrhea, unspecified: Secondary | ICD-10-CM | POA: Diagnosis not present

## 2015-07-30 DIAGNOSIS — D631 Anemia in chronic kidney disease: Secondary | ICD-10-CM | POA: Diagnosis not present

## 2015-07-30 DIAGNOSIS — K922 Gastrointestinal hemorrhage, unspecified: Secondary | ICD-10-CM | POA: Diagnosis present

## 2015-07-30 DIAGNOSIS — R1013 Epigastric pain: Secondary | ICD-10-CM | POA: Diagnosis not present

## 2015-07-30 LAB — BASIC METABOLIC PANEL
ANION GAP: 16 — AB (ref 5–15)
BUN: <5 mg/dL — ABNORMAL LOW (ref 6–20)
CHLORIDE: 100 mmol/L — AB (ref 101–111)
CO2: 21 mmol/L — AB (ref 22–32)
Calcium: 7.4 mg/dL — ABNORMAL LOW (ref 8.9–10.3)
Creatinine, Ser: 1.81 mg/dL — ABNORMAL HIGH (ref 0.44–1.00)
GFR calc non Af Amer: 30 mL/min — ABNORMAL LOW (ref >60)
GFR, EST AFRICAN AMERICAN: 34 mL/min — AB (ref >60)
GLUCOSE: 117 mg/dL — AB (ref 65–99)
POTASSIUM: 4.7 mmol/L (ref 3.5–5.1)
Sodium: 137 mmol/L (ref 135–145)

## 2015-07-30 LAB — CBC WITH DIFFERENTIAL/PLATELET
BASOS ABS: 0 10*3/uL (ref 0.0–0.1)
Basophils Relative: 0 %
Eosinophils Absolute: 0 10*3/uL (ref 0.0–0.7)
Eosinophils Relative: 0 %
HEMATOCRIT: 36.7 % (ref 36.0–46.0)
HEMOGLOBIN: 12 g/dL (ref 12.0–15.0)
LYMPHS PCT: 7 %
Lymphs Abs: 0.7 10*3/uL (ref 0.7–4.0)
MCH: 34.6 pg — ABNORMAL HIGH (ref 26.0–34.0)
MCHC: 32.7 g/dL (ref 30.0–36.0)
MCV: 105.8 fL — AB (ref 78.0–100.0)
MONO ABS: 0.9 10*3/uL (ref 0.1–1.0)
Monocytes Relative: 10 %
NEUTROS ABS: 7.6 10*3/uL (ref 1.7–7.7)
NEUTROS PCT: 83 %
Platelets: 60 10*3/uL — ABNORMAL LOW (ref 150–400)
RBC: 3.47 MIL/uL — AB (ref 3.87–5.11)
RDW: 18.6 % — AB (ref 11.5–15.5)
WBC: 9.2 10*3/uL (ref 4.0–10.5)

## 2015-07-30 LAB — I-STAT CG4 LACTIC ACID, ED
LACTIC ACID, VENOUS: 8.23 mmol/L — AB (ref 0.5–2.0)
LACTIC ACID, VENOUS: 8.36 mmol/L — AB (ref 0.5–2.0)

## 2015-07-30 LAB — HEPATIC FUNCTION PANEL
ALBUMIN: 1.4 g/dL — AB (ref 3.5–5.0)
ALT: 9 U/L — ABNORMAL LOW (ref 14–54)
AST: 44 U/L — ABNORMAL HIGH (ref 15–41)
Alkaline Phosphatase: 386 U/L — ABNORMAL HIGH (ref 38–126)
BILIRUBIN INDIRECT: 0.3 mg/dL (ref 0.3–0.9)
BILIRUBIN TOTAL: 0.6 mg/dL (ref 0.3–1.2)
Bilirubin, Direct: 0.3 mg/dL (ref 0.1–0.5)
TOTAL PROTEIN: 6.8 g/dL (ref 6.5–8.1)

## 2015-07-30 LAB — URINE MICROSCOPIC-ADD ON

## 2015-07-30 LAB — I-STAT CHEM 8, ED
BUN: 4 mg/dL — AB (ref 6–20)
CHLORIDE: 98 mmol/L — AB (ref 101–111)
Calcium, Ion: 0.9 mmol/L — ABNORMAL LOW (ref 1.12–1.23)
Creatinine, Ser: 1.7 mg/dL — ABNORMAL HIGH (ref 0.44–1.00)
Glucose, Bld: 112 mg/dL — ABNORMAL HIGH (ref 65–99)
HEMATOCRIT: 43 % (ref 36.0–46.0)
HEMOGLOBIN: 14.6 g/dL (ref 12.0–15.0)
POTASSIUM: 4.7 mmol/L (ref 3.5–5.1)
Sodium: 139 mmol/L (ref 135–145)
TCO2: 26 mmol/L (ref 0–100)

## 2015-07-30 LAB — URINALYSIS, ROUTINE W REFLEX MICROSCOPIC
BILIRUBIN URINE: NEGATIVE
GLUCOSE, UA: NEGATIVE mg/dL
KETONES UR: NEGATIVE mg/dL
Nitrite: NEGATIVE
PH: 7 (ref 5.0–8.0)
Protein, ur: 100 mg/dL — AB
SPECIFIC GRAVITY, URINE: 1.009 (ref 1.005–1.030)

## 2015-07-30 LAB — LACTIC ACID, PLASMA: Lactic Acid, Venous: 7 mmol/L (ref 0.5–2.0)

## 2015-07-30 LAB — BETA-HYDROXYBUTYRIC ACID: BETA-HYDROXYBUTYRIC ACID: 0.1 mmol/L (ref 0.05–0.27)

## 2015-07-30 LAB — PROTIME-INR
INR: 2.12 — AB (ref 0.00–1.49)
PROTHROMBIN TIME: 23.5 s — AB (ref 11.6–15.2)

## 2015-07-30 MED ORDER — LEVETIRACETAM 250 MG PO TABS
250.0000 mg | ORAL_TABLET | Freq: Every day | ORAL | Status: DC
  Administered 2015-07-31 – 2015-08-02 (×3): 250 mg via ORAL
  Filled 2015-07-30 (×3): qty 1

## 2015-07-30 MED ORDER — CYANOCOBALAMIN 500 MCG PO TABS
250.0000 ug | ORAL_TABLET | Freq: Every evening | ORAL | Status: DC
  Administered 2015-07-30 – 2015-08-01 (×3): 250 ug via ORAL
  Filled 2015-07-30 (×5): qty 1

## 2015-07-30 MED ORDER — MIRTAZAPINE 7.5 MG PO TABS
7.5000 mg | ORAL_TABLET | Freq: Every day | ORAL | Status: DC
  Administered 2015-07-30 – 2015-08-01 (×3): 7.5 mg via ORAL
  Filled 2015-07-30 (×3): qty 1

## 2015-07-30 MED ORDER — ADULT MULTIVITAMIN W/MINERALS CH
1.0000 | ORAL_TABLET | Freq: Every day | ORAL | Status: DC
  Administered 2015-07-30 – 2015-08-02 (×4): 1 via ORAL
  Filled 2015-07-30 (×4): qty 1

## 2015-07-30 MED ORDER — GI COCKTAIL ~~LOC~~
30.0000 mL | Freq: Once | ORAL | Status: AC
  Administered 2015-07-30: 30 mL via ORAL
  Filled 2015-07-30: qty 30

## 2015-07-30 MED ORDER — PANTOPRAZOLE SODIUM 40 MG PO TBEC: 80.0000 mg | DELAYED_RELEASE_TABLET | Freq: Every day | ORAL | Status: DC

## 2015-07-30 MED ORDER — LORAZEPAM 2 MG/ML IJ SOLN: 1.0000 mg | Freq: Four times a day (QID) | INTRAMUSCULAR | Status: DC | PRN

## 2015-07-30 MED ORDER — ACETAMINOPHEN 325 MG PO TABS
650.0000 mg | ORAL_TABLET | Freq: Four times a day (QID) | ORAL | Status: DC | PRN
  Administered 2015-07-30: 650 mg via ORAL
  Filled 2015-07-30 (×2): qty 2

## 2015-07-30 MED ORDER — ONDANSETRON HCL 4 MG PO TABS: 4.0000 mg | ORAL_TABLET | Freq: Four times a day (QID) | ORAL | Status: DC | PRN

## 2015-07-30 MED ORDER — PANTOPRAZOLE SODIUM 40 MG IV SOLR
40.0000 mg | Freq: Once | INTRAVENOUS | Status: DC
  Filled 2015-07-30: qty 40

## 2015-07-30 MED ORDER — SODIUM CHLORIDE 0.9 % IV SOLN
INTRAVENOUS | Status: AC
  Administered 2015-07-30: 18:00:00 via INTRAVENOUS

## 2015-07-30 MED ORDER — ALBUTEROL SULFATE HFA 108 (90 BASE) MCG/ACT IN AERS: 1.0000 | INHALATION_SPRAY | Freq: Four times a day (QID) | RESPIRATORY_TRACT | Status: DC | PRN

## 2015-07-30 MED ORDER — CALCIUM CARBONATE ANTACID 500 MG PO CHEW
500.0000 mg | CHEWABLE_TABLET | Freq: Every day | ORAL | Status: DC
  Administered 2015-07-30 – 2015-08-01 (×3): 500 mg via ORAL
  Filled 2015-07-30 (×3): qty 3
  Filled 2015-07-30: qty 1

## 2015-07-30 MED ORDER — AZATHIOPRINE 50 MG PO TABS
50.0000 mg | ORAL_TABLET | Freq: Every day | ORAL | Status: DC
  Administered 2015-07-31 – 2015-08-02 (×3): 50 mg via ORAL
  Filled 2015-07-30 (×4): qty 1

## 2015-07-30 MED ORDER — DEXTROSE 5 % IV SOLN
1.0000 g | Freq: Once | INTRAVENOUS | Status: AC
  Administered 2015-07-30: 1 g via INTRAVENOUS
  Filled 2015-07-30: qty 10

## 2015-07-30 MED ORDER — SODIUM CHLORIDE 0.9% FLUSH: 3.0000 mL | Freq: Two times a day (BID) | INTRAVENOUS | Status: DC

## 2015-07-30 MED ORDER — LEVETIRACETAM 500 MG PO TABS
500.0000 mg | ORAL_TABLET | Freq: Every day | ORAL | Status: DC
  Filled 2015-07-30: qty 1

## 2015-07-30 MED ORDER — PANTOPRAZOLE SODIUM 40 MG IV SOLR
40.0000 mg | Freq: Two times a day (BID) | INTRAVENOUS | Status: DC
  Administered 2015-07-30 – 2015-08-02 (×6): 40 mg via INTRAVENOUS
  Filled 2015-07-30 (×6): qty 40

## 2015-07-30 MED ORDER — GABAPENTIN 300 MG PO CAPS
300.0000 mg | ORAL_CAPSULE | Freq: Every day | ORAL | Status: DC
  Administered 2015-07-30 – 2015-08-02 (×4): 300 mg via ORAL
  Filled 2015-07-30 (×4): qty 1

## 2015-07-30 MED ORDER — VANCOMYCIN HCL IN DEXTROSE 750-5 MG/150ML-% IV SOLN
750.0000 mg | Freq: Once | INTRAVENOUS | Status: AC
  Administered 2015-07-30: 750 mg via INTRAVENOUS
  Filled 2015-07-30 (×2): qty 150

## 2015-07-30 MED ORDER — SODIUM CHLORIDE 0.9 % IV BOLUS (SEPSIS)
30.0000 mL/kg | Freq: Once | INTRAVENOUS | Status: AC
  Administered 2015-07-30: 1110 mL via INTRAVENOUS

## 2015-07-30 MED ORDER — PANTOPRAZOLE SODIUM 40 MG IV SOLR
40.0000 mg | Freq: Once | INTRAVENOUS | Status: AC
  Administered 2015-07-30: 40 mg via INTRAVENOUS

## 2015-07-30 MED ORDER — ACETAMINOPHEN 500 MG PO TABS
1000.0000 mg | ORAL_TABLET | Freq: Once | ORAL | Status: AC
  Administered 2015-07-31: 1000 mg via ORAL
  Filled 2015-07-30: qty 2

## 2015-07-30 MED ORDER — FOLIC ACID 1 MG PO TABS
1.0000 mg | ORAL_TABLET | Freq: Every day | ORAL | Status: DC
  Administered 2015-07-30 – 2015-08-02 (×4): 1 mg via ORAL
  Filled 2015-07-30 (×4): qty 1

## 2015-07-30 MED ORDER — SODIUM CHLORIDE 0.9 % IV SOLN: 250.0000 mL | INTRAVENOUS | Status: DC | PRN

## 2015-07-30 MED ORDER — FLUOXETINE HCL 10 MG PO CAPS
10.0000 mg | ORAL_CAPSULE | Freq: Every day | ORAL | Status: DC
  Administered 2015-07-30 – 2015-08-02 (×4): 10 mg via ORAL
  Filled 2015-07-30 (×3): qty 1

## 2015-07-30 MED ORDER — HYDROXYCHLOROQUINE SULFATE 200 MG PO TABS
400.0000 mg | ORAL_TABLET | Freq: Every day | ORAL | Status: DC
  Administered 2015-07-30 – 2015-08-02 (×4): 400 mg via ORAL
  Filled 2015-07-30 (×4): qty 2

## 2015-07-30 MED ORDER — PANTOPRAZOLE SODIUM 40 MG PO TBEC: 40.0000 mg | DELAYED_RELEASE_TABLET | Freq: Once | ORAL | Status: DC

## 2015-07-30 MED ORDER — LORAZEPAM 1 MG PO TABS: 1.0000 mg | ORAL_TABLET | Freq: Four times a day (QID) | ORAL | Status: DC | PRN

## 2015-07-30 MED ORDER — ONDANSETRON HCL 4 MG/2ML IJ SOLN: 4.0000 mg | Freq: Four times a day (QID) | INTRAMUSCULAR | Status: DC | PRN

## 2015-07-30 MED ORDER — THIAMINE HCL 100 MG/ML IJ SOLN: 100.0000 mg | Freq: Every day | INTRAMUSCULAR | Status: DC

## 2015-07-30 MED ORDER — VITAMIN B-1 100 MG PO TABS
100.0000 mg | ORAL_TABLET | Freq: Every day | ORAL | Status: DC
  Administered 2015-07-31 – 2015-08-02 (×3): 100 mg via ORAL
  Filled 2015-07-30 (×3): qty 1

## 2015-07-30 MED ORDER — PANCRELIPASE (LIP-PROT-AMYL) 12000-38000 UNITS PO CPEP
12000.0000 [IU] | ORAL_CAPSULE | Freq: Three times a day (TID) | ORAL | Status: DC
  Administered 2015-07-31 – 2015-08-02 (×6): 12000 [IU] via ORAL
  Filled 2015-07-30 (×6): qty 1

## 2015-07-30 MED ORDER — SODIUM CHLORIDE 0.9% FLUSH: 3.0000 mL | INTRAVENOUS | Status: DC | PRN

## 2015-07-30 MED ORDER — LEVETIRACETAM 500 MG PO TABS
500.0000 mg | ORAL_TABLET | ORAL | Status: DC
  Administered 2015-07-30: 500 mg via ORAL
  Filled 2015-07-30: qty 1

## 2015-07-30 NOTE — ED Notes (Signed)
Dinner tray ordered, renal diet 

## 2015-07-30 NOTE — ED Notes (Signed)
Per [facility] - pt from dialysis center, pt was finishing up dialysis w/ approx 68min left when she began to experience sharp generalized abd pain and x3-4 episodes of vomiting BRB. Pt w/ hx of gastric ulcers and alcoholism, last drink yesterday.

## 2015-07-30 NOTE — ED Notes (Signed)
Patient on monitor, continuous pulse oximetry and blood pressure cuff; oxygen [place] (2L) and patient is not in a gown for she has a large device holding pressure on her graft and does not want her pullover cut

## 2015-07-30 NOTE — ED Provider Notes (Signed)
CSN: [id number]     Arrival date & time 07/30/15  [id number] History   First MD Initiated Contact with Patient 07/30/15 [phone]     Chief Complaint  Patient presents with  . Hematemesis     (Consider location/radiation/quality/duration/timing/severity/associated sxs/prior Treatment) Patient is a 60 y.o. female presenting with vomiting. The history is provided by the patient.  Emesis Severity:  Moderate Duration:  2 days Timing:  Constant Quality:  Bright red blood Progression:  Resolved Chronicity:  New Recent urination:  Normal Relieved by:  Nothing Worsened by:  Nothing tried Ineffective treatments:  None tried Associated symptoms: no arthralgias, no chills, no diarrhea, no headaches and no myalgias   Risk factors: alcohol use    60 yo F with a chief complaint of bright red blood in her vomit. Patient had 3 or 4 episodes of this while she was finishing dialysis. Had some severe epigastric pain with this as well. Symptoms spontaneously resolved after those 3 or 4 episodes. Patient now thinks that she feels great. Denies any chest pain shortness of breath abdominal pain. Denies any nausea. Feels that she is able to eat and drink now without difficulty. Denies history of similar such events. Patient does have a history of alcoholism. Last had a drink yesterday. Has a history of seizures as well.  Past Medical History  Diagnosis Date  . Anemia, B12 deficiency   . History of acute pancreatitis   . Right knee pain     No recent imaging on chart  . 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 Dr. Mancel Bale, [facility]  . Hypertriglyceridemia   . GERD (gastroesophageal reflux disease)   . Subdural hematoma ([place]) 02/2008    Likely 2/2 trauma from seizure from EtOH withdrawal, chronic in nature, sees Dr. Jerene Bears. Most recent CT head 10/2009 showing stable but persistent hematoma without mass effect.  . History of seizure disorder      Likely 2/2 alcohol abuse  . Hypocalcemia   . Hypomagnesemia   . Failure to thrive in childhood     Unclear etiology  . HTN (hypertension)   . Thrombocytopenia ([place])   . Hepatomegaly     On exam  . Joint pain   . Alcohol abuse   . Vitamin D deficiency   . Pancreatitis   . Insomnia   . Hyperlipidemia   . Pernicious anemia   . Macrocytic anemia   . 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 Depression   . Fx humeral neck 04/17/2011    Transverse fracture- minimally displaced- managed as outpatient   . ABNORMAL PAP SMEAR, LGSIL 07/23/2008    Annotation: HPV positive CIN I Dr. Mancel Bale, [facility] Qualifier: Diagnosis of  By: Oretha Ellis    . Pneumonia 05/20/2012  . Arthritis     "shoulders" (08/15/2013)  . 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  Marland Kitchen Chronic diastolic CHF (congestive heart failure) ([place])   . Hypomagnesemia   . Seizures ([place])     "don't know when/why I had them; daughter was always there w/me"  . On home oxygen therapy     "3L; mostly at night" (06/19/2014)  . Shortness of breath dyspnea   . Pleural effusion   . Renal insufficiency    Past Surgical History  Procedure Laterality Date  . Cesarean section  1983  . Esophagogastroduodenoscopy  07/11/2011    Procedure: ESOPHAGOGASTRODUODENOSCOPY (EGD);  Surgeon: Beryle Beams, MD;  Location: [facility];  Service: Endoscopy;  Laterality: N/A;  . Colonoscopy  07/11/2011    Procedure: COLONOSCOPY;  Surgeon: Beryle Beams, MD;  Location: [facility] ENDOSCOPY;  Service: Endoscopy;  Laterality: N/A;  . Eye surgery Left     "trauma"  . Right colectomy  08/28/2011  . Esophagogastroduodenoscopy N/A 12/01/2012    Procedure: ESOPHAGOGASTRODUODENOSCOPY (EGD);  Surgeon: Irene Shipper, MD;  Location: [facility];  Service: Endoscopy;  Laterality: N/A;  . Colonoscopy with esophagogastroduodenoscopy (egd) Left 08/21/2013    Procedure: COLONOSCOPY WITH ESOPHAGOGASTRODUODENOSCOPY (EGD);   Surgeon: Beryle Beams, MD;  Location: [facility] ENDOSCOPY;  Service: Endoscopy;  Laterality: Left;  . Subxyphoid pericardial window N/A 12/04/2013    Procedure: SUBXYPHOID PERICARDIAL WINDOW WITH TEE;  Surgeon: Ivin Poot, MD;  Location: [place];  Service: Thoracic;  Laterality: N/A;  . Intraoperative transesophageal echocardiogram N/A 12/04/2013    Procedure: INTRAOPERATIVE TRANSESOPHAGEAL ECHOCARDIOGRAM;  Surgeon: Ivin Poot, MD;  Location: [place];  Service: Open Heart Surgery;  Laterality: N/A;  . Exchange of a dialysis catheter Left 10/22/2014    Procedure: EXCHANGE OF A DIALYSIS CATHETER;  Surgeon: Conrad Saltville, MD;  Location: [place];  Service: Vascular;  Laterality: Left;  . Av fistula placement Left 12/03/2014    Procedure: INSERTION OF ARTERIOVENOUS (AV) GORE-TEX GRAFT ARM;  Surgeon: Mal Misty, MD;  Location[facility];  Service: Vascular;  Laterality: Left;   Family History  Problem Relation Age of Onset  . Cancer Mother     Died from stomach cancer and "flesh eating rash  . Heart failure Father     Died in 52s from an MI  . Alcohol abuse Sister     Twin sister drinks a lot, as did both her parents and brothers  . Stroke Brother     Has 7 brothers, 1 with CVA  . Lupus Mother    Social History  Substance Use Topics  . Smoking status: Former Smoker -- 0.50 packs/day for 40 years    Types: Cigarettes    Quit date: 09/20/2010  . Smokeless tobacco: Never Used  . Alcohol Use: 0.0 oz/week    0 Standard drinks or equivalent per week     Comment: 12/02/14 "graduated from [employer] (for alcohol abuse) in October 2015"    OB History    No data available     Review of Systems  Constitutional: Negative for fever and chills.  HENT: Negative for congestion and rhinorrhea.   Eyes: Negative for redness and visual disturbance.  Respiratory: Negative for shortness of breath and wheezing.   Cardiovascular: Negative for chest pain and palpitations.  Gastrointestinal: Positive for  vomiting. Negative for nausea and diarrhea.  Genitourinary: Negative for dysuria and urgency.  Musculoskeletal: Negative for myalgias and arthralgias.  Skin: Negative for pallor and wound.  Neurological: Negative for dizziness and headaches.      Allergies  Amitriptyline hcl and Doxycycline hyclate  Home Medications   Prior to Admission medications   Medication Sig Start Date End Date Taking? Authorizing Provider  albuterol (PROAIR HFA) 108 (90 BASE) MCG/ACT inhaler Inhale 1-2 puffs into the lungs every 6 (six) hours as needed for wheezing or shortness of breath. 01/14/14  Yes Jones Bales, MD  azaTHIOprine (IMURAN) 50 MG tablet Take 50 mg by mouth daily. 06/29/15  Yes Historical Provider, MD  calcium carbonate (TUMS - DOSED IN MG ELEMENTAL  CALCIUM) 500 MG chewable tablet Chew 1,000 tablets by mouth at bedtime.   Yes Historical Provider, MD  cetirizine (ZYRTEC) 10 MG tablet Take 1 tablet (10 mg total) by mouth daily as needed for allergies. 06/11/15  Yes Shela Leff, MD  CREON 12000 UNITS CPEP capsule Take 1 capsule (12,000 Units total) by mouth 3 (three) times daily before meals. 01/21/15  Yes Shela Leff, MD  FLUoxetine (PROZAC) 10 MG capsule Take 1 capsule (10 mg total) by mouth daily. For depression 01/21/15  Yes Shela Leff, MD  folic acid (FOLVITE) 1 MG tablet Take 1 tablet (1 mg total) by mouth daily. For folic acid replacement 06/08/15  Yes Shela Leff, MD  gabapentin (NEURONTIN) 300 MG capsule Take 1 capsule (300 mg total) by mouth daily. 04/09/15 04/06/16 Yes Shela Leff, MD  hydroxychloroquine (PLAQUENIL) 200 MG tablet Take 400 mg by mouth daily.   Yes Historical Provider, MD  Lactobacillus (ACIDOPHILUS PROBIOTIC PO) Take 2 mg by mouth 3 (three) times daily. Take 2 (two) tablets by mouth three times daily   Yes Historical Provider, MD  levETIRAcetam (KEPPRA) 250 MG tablet Take 1 tablet (250 mg total) by mouth daily. Take additional 500mg  (2 tablets) after  Dialysis treatments on Monday, Wednesday, Friday 07/16/15  Yes Shary Decamp, PA-C  mirtazapine (REMERON) 7.5 MG tablet Take 1 tablet (7.5 mg total) by mouth at bedtime. 01/21/15  Yes Shela Leff, MD  Multiple Vitamin (MULTIVITAMIN WITH MINERALS) TABS tablet Take 1 tablet by mouth daily. For vitamin replacement 03/24/15  Yes Shela Leff, MD  omeprazole (PRILOSEC) 40 MG capsule Take 1 capsule (40 mg total) by mouth daily. 01/21/15  Yes Shela Leff, MD  thiamine (VITAMIN B-1) 100 MG tablet Take 1 tablet (100 mg total) by mouth daily. For low thiamine 01/21/15  Yes Shela Leff, MD  vitamin B-12 (CYANOCOBALAMIN) 250 MCG tablet Take 1 tablet (250 mcg total) by mouth every evening. 01/21/15  Yes Shela Leff, MD  camphor-menthol [facility]) lotion Apply 1 application topically daily as needed for itching.    Historical Provider, MD  loperamide (IMODIUM) 2 MG capsule Take 2 mg by mouth every 6 (six) hours as needed for diarrhea or loose stools.    Historical Provider, MD  nystatin (MYCOSTATIN) 100000 UNIT/ML suspension Take 5 mLs (500,000 Units total) by mouth 4 (four) times daily. 07/13/15   Ejiroghene E Emokpae, MD   BP 119/76 mmHg  Pulse 110  Temp(Src) 102.1 F (38.9 C) (Oral)  Resp 20  SpO2 100% Physical Exam  Constitutional: She is oriented to person, place, and time. She appears cachectic. No distress.  Chronically ill-appearing  HENT:  Head: Normocephalic and atraumatic.  Eyes: EOM are normal. Pupils are equal, round, and reactive to light.  Neck: Normal range of motion. Neck supple.  Cardiovascular: Normal rate and regular rhythm.  Exam reveals no gallop and no friction rub.   No murmur heard. Pulmonary/Chest: Effort normal. She has no wheezes. She has no rales.  Abdominal: Soft. She exhibits no distension. There is no tenderness. There is no rebound and no guarding.  Musculoskeletal: She exhibits no edema or tenderness.  Neurological: She is alert and oriented to person,  place, and time.  Skin: Skin is warm and dry. She is not diaphoretic.  Psychiatric: She has a normal mood and affect. Her behavior is normal.  Nursing note and vitals reviewed.   ED Course  Procedures (including critical care time) Labs Review Labs Reviewed  CBC WITH DIFFERENTIAL/PLATELET - Abnormal; Notable for the following:  RBC 3.47 (*)    MCV 105.8 (*)    MCH 34.6 (*)    RDW 18.6 (*)    Platelets 60 (*)    All other components within normal limits  BASIC METABOLIC PANEL - Abnormal; Notable for the following:    Chloride 100 (*)    CO2 21 (*)    Glucose, Bld 117 (*)    BUN <5 (*)    Creatinine, Ser 1.81 (*)    Calcium 7.4 (*)    GFR calc non Af Amer 30 (*)    GFR calc Af Amer 34 (*)    Anion gap 16 (*)    All other components within normal limits  HEPATIC FUNCTION PANEL - Abnormal; Notable for the following:    Albumin 1.4 (*)    AST 44 (*)    ALT 9 (*)    Alkaline Phosphatase 386 (*)    All other components within normal limits  PROTIME-INR - Abnormal; Notable for the following:    Prothrombin Time 23.5 (*)    INR 2.12 (*)    All other components within normal limits  I-STAT CHEM 8, ED - Abnormal; Notable for the following:    Chloride 98 (*)    BUN 4 (*)    Creatinine, Ser 1.70 (*)    Glucose, Bld 112 (*)    Calcium, Ion 0.90 (*)    All other components within normal limits  I-STAT CG4 LACTIC ACID, ED - Abnormal; Notable for the following:    Lactic Acid, Venous 8.23 (*)    All other components within normal limits  I-STAT CG4 LACTIC ACID, ED - Abnormal; Notable for the following:    Lactic Acid, Venous 8.36 (*)    All other components within normal limits  CULTURE, BLOOD (ROUTINE X 2)  CULTURE, BLOOD (ROUTINE X 2)    Imaging Review Ct Abdomen Pelvis Wo Contrast  07/30/2015  CLINICAL DATA:  Room and was provided with item leaking no go with Nausea and vomiting during dialysis today. Sharp generalized abdominal pain during that time. EXAM: CT ABDOMEN  AND PELVIS WITHOUT CONTRAST TECHNIQUE: Multidetector CT imaging of the abdomen and pelvis was performed following the standard protocol without IV contrast. COMPARISON:  No comparison studies available. FINDINGS: Lower chest:  Unremarkable Hepatobiliary: Liver is 19 cm in craniocaudal length. Subtle nodularity of the hepatic contour raises the question of cirrhosis. Layering tiny stones are noted in the lumen of the gallbladder. No intrahepatic or extrahepatic biliary dilation. Pancreas: Numerous coarse dystrophic calcifications are seen throughout the pancreas with dilatation of the main pancreatic duct. Pancreatic parenchyma is diffusely atrophic. Spleen: No splenomegaly. No focal mass lesion. Adrenals/Urinary Tract: No adrenal nodule or mass. Kidneys are unremarkable. No hydronephrosis. No evidence for hydroureter. The urinary bladder appears normal for the degree of distention. Stomach/Bowel: Stomach is nondistended. No gastric wall thickening. No evidence of outlet obstruction. Duodenum is normally positioned as is the ligament of Treitz. No small bowel wall thickening. No small bowel dilatation. Patient is status post right hemicolectomy. Remaining segments of the colon show mild circumferential wall thickening and overall the colon appears featureless. There is no substantial pericolonic edema or inflammation. Vascular/Lymphatic: No abdominal aortic aneurysm. No abdominal atherosclerotic calcification. There is no gastrohepatic or hepatoduodenal ligament lymphadenopathy. No intraperitoneal or retroperitoneal lymphadenopathy. Multiple small lymph nodes are seen in the retroperitoneal space. No pelvic sidewall lymphadenopathy. Scattered small lymph nodes are seen in the inguinal regions bilaterally. Reproductive: The uterus has normal CT imaging appearance.  There is no adnexal mass. Other: No intraperitoneal free fluid. Musculoskeletal: Bone windows reveal no worrisome lytic or sclerotic osseous lesions.  IMPRESSION: 1. Featureless colon with suggestion of mild diffuse circumferential wall thickening. Underlying infectious/inflammatory colitis would be a consideration. 2. Mild hepatomegaly. Liver contour is nodular raising concern for cirrhosis. 3. Dystrophic calcifications seen throughout the pancreas with dilatation of the main pancreatic duct. No discrete mass lesion is seen in the head of the pancreas on this study without IV contrast serial. Features may be related to chronic pain is. MRI without and with contrast could be used to further evaluate and exclude pancreatic head lesion as etiology for the dilatation of the main pancreatic duct. 4. Cholelithiasis. Electronically Signed   By: Misty Stanley M.D.   On: 07/30/2015 14:58   Dg Chest 2 View  07/30/2015  CLINICAL DATA:  Dizziness, fell down, nausea, dialysis patient, elevated lactate, hypertension EXAM: CHEST  2 VIEW COMPARISON:  07/01/2015 FINDINGS: Enlargement of cardiac silhouette. Mediastinal contours and pulmonary vascularity normal. Lungs emphysematous but clear. No infiltrate, pleural effusion or pneumothorax. Diffuse osseous demineralization. IMPRESSION: Emphysematous changes without acute infiltrate. Electronically Signed   By: Lavonia Dana M.D.   On: 07/30/2015 12:50   I have personally reviewed and evaluated these images and lab results as part of my medical decision-making.   EKG Interpretation   Date/Time:  Friday July 30 2015 10:09:10 EST Ventricular Rate:  102 PR Interval:  178 QRS Duration: 79 QT Interval:  368 QTC Calculation: 479 R Axis:   104 Text Interpretation:  Sinus tachycardia Anterior infarct, old background  noise difficult to interpret Otherwise no significant change Confirmed by  Blaze Nylund MD, DANIEL [id number]) on 07/30/2015 10:28:18 AM      MDM   Final diagnoses:  Hematemesis with nausea  Lactic acidosis    60 yo F with a chief complaint of hematemesis. Patient is chronically ill-appearing. She states she  is back to baseline and is ready to go home. Talk the patient into getting an oral trial obtaining basic labs. Patient's hemoglobin is above baseline. No lab abnormality concerning for urgent dialysis. Patient has a slightly elevated anion gap 16. Will obtain a lactate.  Lactic acid significantly elevated.  Given rocephin, vanc.  CT with possible colitis.  Discussed with GI will come and evaluate for possible variceal bleed given etoh hx.   Repeat lab with persistent elevation. Given 30cc/kg iv fluid bolus.   CRITICAL CARE Performed by: Cecilio Asper   Total critical care time: 40 minutes  Critical care time was exclusive of separately billable procedures and treating other patients.  Critical care was necessary to treat or prevent imminent or life-threatening deterioration.  Critical care was time spent personally by me on the following activities: development of treatment plan with patient and/or surrogate as well as nursing, discussions with consultants, evaluation of patient's response to treatment, examination of patient, obtaining history from patient or surrogate, ordering and performing treatments and interventions, ordering and review of laboratory studies, ordering and review of radiographic studies, pulse oximetry and re-evaluation of patient's condition.  The patients results and plan were reviewed and discussed.   Any x-rays performed were independently reviewed by myself.   Differential diagnosis were considered with the presenting HPI.  Medications  vancomycin (VANCOCIN) 740 mg in sodium chloride 0.9 % 150 mL IVPB (not administered)  gi cocktail (Maalox,Lidocaine,Donnatal) (30 mLs Oral Given 07/30/15 1038)  pantoprazole (PROTONIX) injection 40 mg (40 mg Intravenous Given 07/30/15 1107)  sodium chloride  0.9 % bolus 1,110 mL (0 mLs Intravenous Paused 07/30/15 1254)  cefTRIAXone (ROCEPHIN) 1 g in dextrose 5 % 50 mL IVPB (0 g Intravenous Stopped 07/30/15 1322)    Filed  Vitals:   07/30/15 1009 07/30/15 1250 07/30/15 1430  BP: 149/91 135/93 119/76  Pulse: 102 115 110  Temp: 98.2 F (36.8 C) 102.1 F (38.9 C)   TempSrc: Oral Oral   Resp: 17 16 20   SpO2: 100% 100% 100%    Final diagnoses:  Hematemesis with nausea  Lactic acidosis    Admission/ observation were discussed with the admitting physician, patient and/or family and they are comfortable with the plan.    Deno Etienne, DO 07/30/15 1534

## 2015-07-30 NOTE — Consult Note (Signed)
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  She had 3-4 episodes of hematemesis upon finishing her dialysis.  She states that the bleeding was minor - "streaks".  Additionally, she states that she vomited a few times first and then the blood followed.  There was an issues with epigastric pain, but now all of her symptoms have resolved.  Her HGB is stable.  In the past she was evaluated with multiple endoscope procedures between 2013-2015 and the 2015 EGD identified a friable patch of mucosa in the proximal gastric body.  This area was treated with APC as it was actively bleeding.  At that time I felt that the heparin administered during her dialysis precipitated the bleeding.  There was no evidence of esophageal varices.  The EGD performed by Dr. Henrene Pastor in 2014 was significant for a Mallory-Weiss tear.  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  Past Medical History  Diagnosis Date  . Anemia, B12 deficiency   . History of acute pancreatitis   . Right knee pain     No recent imaging on chart  . 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 Dr. Mancel Bale, [facility]  . Hypertriglyceridemia   . GERD (gastroesophageal reflux disease)   . Subdural hematoma ([place]) 02/2008    Likely 2/2 trauma from seizure from EtOH withdrawal, chronic in nature, sees Dr. Jerene Bears. Most recent CT head 10/2009 showing stable but persistent hematoma without mass effect.  . History of seizure disorder     Likely 2/2 alcohol abuse  . Hypocalcemia   . Hypomagnesemia   . Failure to  thrive in childhood     Unclear etiology  . HTN (hypertension)   . Thrombocytopenia ([place])   . Hepatomegaly     On exam  . Joint pain   . Alcohol abuse   . Vitamin D deficiency   . Pancreatitis   . Insomnia   . Hyperlipidemia   . Pernicious anemia   . Macrocytic anemia   . 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 Depression   . Fx humeral neck 04/17/2011    Transverse fracture- minimally displaced- managed as outpatient   . ABNORMAL PAP SMEAR, LGSIL 07/23/2008    Annotation: HPV positive CIN I Dr. Mancel Bale, [facility] Qualifier: Diagnosis of  By: Oretha Ellis    . Pneumonia 05/20/2012  . Arthritis     "shoulders" (08/15/2013)  . 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  Marland Kitchen Chronic diastolic CHF (congestive heart failure) ([place])   . Hypomagnesemia   . Seizures ([place])     "don't know when/why I had them; daughter was always there w/me"  . On home oxygen therapy     "3L; mostly at night" (06/19/2014)  . Shortness of breath dyspnea   . Pleural effusion   . Renal insufficiency     Past Surgical History  Procedure Laterality Date  . Cesarean section  1983  . Esophagogastroduodenoscopy  07/11/2011    Procedure: ESOPHAGOGASTRODUODENOSCOPY (EGD);  Surgeon: Beryle Beams, MD;  Location: [facility];  Service: Endoscopy;  Laterality: N/A;  . Colonoscopy  07/11/2011    Procedure: COLONOSCOPY;  Surgeon: Beryle Beams, MD;  Location: [facility] ENDOSCOPY;  Service: Endoscopy;  Laterality: N/A;  . Eye surgery Left     "trauma"  . Right colectomy  08/28/2011  . Esophagogastroduodenoscopy N/A 12/01/2012    Procedure: ESOPHAGOGASTRODUODENOSCOPY (EGD);  Surgeon: Irene Shipper, MD;  Location: [facility];  Service: Endoscopy;  Laterality: N/A;  . Colonoscopy with esophagogastroduodenoscopy (egd) Left 08/21/2013    Procedure: COLONOSCOPY WITH ESOPHAGOGASTRODUODENOSCOPY (EGD);  Surgeon: Beryle Beams, MD;  Location: [facility] ENDOSCOPY;  Service: Endoscopy;   Laterality: Left;  . Subxyphoid pericardial window N/A 12/04/2013    Procedure: SUBXYPHOID PERICARDIAL WINDOW WITH TEE;  Surgeon: Ivin Poot, MD;  Location: [place];  Service: Thoracic;  Laterality: N/A;  . Intraoperative transesophageal echocardiogram N/A 12/04/2013    Procedure: INTRAOPERATIVE TRANSESOPHAGEAL ECHOCARDIOGRAM;  Surgeon: Ivin Poot, MD;  Location: [place];  Service: Open Heart Surgery;  Laterality: N/A;  . Exchange of a dialysis catheter Left 10/22/2014    Procedure: EXCHANGE OF A DIALYSIS CATHETER;  Surgeon: Conrad Crossett, MD;  Location: [place];  Service: Vascular;  Laterality: Left;  . Av fistula placement Left 12/03/2014    Procedure: INSERTION OF ARTERIOVENOUS (AV) GORE-TEX GRAFT ARM;  Surgeon: Mal Misty, MD;  Location: [facility] OR;  Service: Vascular;  Laterality: Left;    Family History  Problem Relation Age of Onset  . Cancer Mother     Died from stomach cancer and "flesh eating rash  . Heart failure Father     Died in 80s from an MI  . Alcohol abuse Sister     Twin sister drinks a lot, as did both her parents and brothers  . Stroke Brother     Has 7 brothers, 1 with CVA  . Lupus Mother     Social History:  reports that she quit smoking about 4 years ago. Her smoking use included Cigarettes. She has a 20 pack-year smoking history. She has never used smokeless tobacco. She reports that she drinks alcohol. She reports that she uses illicit drugs (Marijuana and Cocaine).  Allergies:  Allergies  Allergen Reactions  . Amitriptyline Hcl Swelling    In the face.  . Doxycycline Hyclate Itching    Feels like something crawling under her skin    Medications: Scheduled: Continuous:  Results for orders placed or performed during the hospital encounter of 07/30/15 (from the past 24 hour(s))  CBC with Differential     Status: Abnormal   Collection Time: 07/30/15  9:55 AM  Result Value Ref Range   WBC 9.2 4.0 - 10.5 K/uL   RBC 3.47 (L) 3.87 - 5.11 MIL/uL   Hemoglobin  12.0 12.0 - 15.0 g/dL   HCT 36.7 36.0 - 46.0 %   MCV 105.8 (H) 78.0 - 100.0 fL   MCH 34.6 (H) 26.0 - 34.0 pg   MCHC 32.7 30.0 - 36.0 g/dL   RDW 18.6 (H) 11.5 - 15.5 %   Platelets 60 (L) 150 - 400 K/uL   Neutrophils Relative % 83 %   Neutro Abs 7.6 1.7 - 7.7 K/uL   Lymphocytes Relative 7 %   Lymphs Abs 0.7 0.7 - 4.0 K/uL   Monocytes Relative 10 %   Monocytes Absolute 0.9 0.1 - 1.0 K/uL   Eosinophils Relative 0 %   Eosinophils Absolute 0.0 0.0 - 0.7 K/uL  Basophils Relative 0 %   Basophils Absolute 0.0 0.0 - 0.1 K/uL  Basic metabolic panel     Status: Abnormal   Collection Time: 07/30/15  9:55 AM  Result Value Ref Range   Sodium 137 135 - 145 mmol/L   Potassium 4.7 3.5 - 5.1 mmol/L   Chloride 100 (L) 101 - 111 mmol/L   CO2 21 (L) 22 - 32 mmol/L   Glucose, Bld 117 (H) 65 - 99 mg/dL   BUN <5 (L) 6 - 20 mg/dL   Creatinine, Ser 1.81 (H) 0.44 - 1.00 mg/dL   Calcium 7.4 (L) 8.9 - 10.3 mg/dL   GFR calc non Af Amer 30 (L) >60 mL/min   GFR calc Af Amer 34 (L) >60 mL/min   Anion gap 16 (H) 5 - 15  I-stat chem 8, ed     Status: Abnormal   Collection Time: 07/30/15 10:04 AM  Result Value Ref Range   Sodium 139 135 - 145 mmol/L   Potassium 4.7 3.5 - 5.1 mmol/L   Chloride 98 (L) 101 - 111 mmol/L   BUN 4 (L) 6 - 20 mg/dL   Creatinine, Ser 1.70 (H) 0.44 - 1.00 mg/dL   Glucose, Bld 112 (H) 65 - 99 mg/dL   Calcium, Ion 0.90 (L) 1.12 - 1.23 mmol/L   TCO2 26 0 - 100 mmol/L   Hemoglobin 14.6 12.0 - 15.0 g/dL   HCT 43.0 36.0 - 46.0 %  I-Stat CG4 Lactic Acid, ED     Status: Abnormal   Collection Time: 07/30/15 11:18 AM  Result Value Ref Range   Lactic Acid, Venous 8.23 (HH) 0.5 - 2.0 mmol/L   Comment NOTIFIED PHYSICIAN   Hepatic function panel     Status: Abnormal   Collection Time: 07/30/15  1:49 PM  Result Value Ref Range   Total Protein 6.8 6.5 - 8.1 g/dL   Albumin 1.4 (L) 3.5 - 5.0 g/dL   AST 44 (H) 15 - 41 U/L   ALT 9 (L) 14 - 54 U/L   Alkaline Phosphatase 386 (H) 38 - 126 U/L    Total Bilirubin 0.6 0.3 - 1.2 mg/dL   Bilirubin, Direct 0.3 0.1 - 0.5 mg/dL   Indirect Bilirubin 0.3 0.3 - 0.9 mg/dL  Protime-INR     Status: Abnormal   Collection Time: 07/30/15  1:49 PM  Result Value Ref Range   Prothrombin Time 23.5 (H) 11.6 - 15.2 seconds   INR 2.12 (H) 0.00 - 1.49  I-Stat CG4 Lactic Acid, ED     Status: Abnormal   Collection Time: 07/30/15  1:57 PM  Result Value Ref Range   Lactic Acid, Venous 8.36 (HH) 0.5 - 2.0 mmol/L   Comment NOTIFIED PHYSICIAN      Dg Chest 2 View  07/30/2015  CLINICAL DATA:  Dizziness, fell down, nausea, dialysis patient, elevated lactate, hypertension EXAM: CHEST  2 VIEW COMPARISON:  07/01/2015 FINDINGS: Enlargement of cardiac silhouette. Mediastinal contours and pulmonary vascularity normal. Lungs emphysematous but clear. No infiltrate, pleural effusion or pneumothorax. Diffuse osseous demineralization. IMPRESSION: Emphysematous changes without acute infiltrate. Electronically Signed   By: Lavonia Dana M.D.   On: 07/30/2015 12:50    ROS:  As stated above in the HPI otherwise negative.  Blood pressure 135/93, pulse 115, temperature 102.1 F (38.9 C), temperature source Oral, resp. rate 16, SpO2 100 %.    PE: Gen: NAD, Alert and Oriented HEENT:  [place]/AT, EOMI Neck: Supple, no LAD Lungs: CTA Bilaterally CV: RRR without M/G/R  ABM: Soft, NTND, +BS Ext: No C/C/E  Assessment/Plan: 1) Hematemesis. 2) ETOH abuse. 3) History of pancreatitis. 4) ESRD.   The patient is stable.  No further bleeding.  She has had extensive GI work up in the past for bleeding.  Again, there was no evidence of esophageal varices.  At this time, I think a conservative approach to monitoring her will be the best course of action.  She may of had a Mallory-Weiss tear again.  Plan: 1) Follow HGB. 2) Transfuse as necessary. 3) PPI.  Akshita Italiano D 07/30/2015, 2:54 PM

## 2015-07-30 NOTE — Progress Notes (Signed)
Deaccess graft;pressure applied;no bleeding noted,dressing applied;pt tolerated well.

## 2015-07-30 NOTE — ED Notes (Signed)
Patient transported to X-ray 

## 2015-07-30 NOTE — H&P (Signed)
Date: 07/30/2015               Patient Name:  [name] [name] MRN: [id number]  DOB: 1956/01/24 Age / Sex: 60 y.o., female   PCP: [name] Leff, MD         Medical Service: Internal Medicine Teaching Service         Attending Physician: Dr. Aldine Contes, MD    First Contact: Dr. Lovena [name] Pager: [phone]  Second Contact: Dr. Marvel [name] Pager: [phone]       After Hours (After 5p/  First Contact Pager: [phone]  weekends / holidays): Second Contact Pager: [phone]   Chief Complaint: vomiting blood  History of Present Illness: 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  She states she received ~3 hours of dialysis (normal session is 4 hours), when she started to vomit clear liquid with bright red streaks.  She denies frank hematemesis or hemoptysis.  She only takes on Advil maybe once a month.  It has not occurred since. She has intermittent diarrhea, which may float.  She endorses adherence to Creon.  She denies hematochezia or melena.  She is currently without complaint.  She is normally nauseated and vomited last week, but without blood.  She has a h/o chronic alcohol abuse without evidence of gastric or esophageal varices.  She had an endoscopy in 2015 following an episode of hematemesis, which only demonstrated a gastric AVM.  Colonoscopy at that time showed hemorrhoids.  EGD in 2014 showed a Mallory-Weiss tear.   She states she is SOB at all times 2/2 her heart failure. She has stable 3 pillow orthopnea, but denies weight gain or [name] edema.  She quit smoking 20 years ago.   She has a long h/o alcohol use.  Her last drink was last night, reported as 4 oz of Gin. She drinks 2-3 nights/week and on weekends.    In the ED, her Hgb was stable at 12.0 with MCV 106.  Lactate was elevated at 8.23.  She had a  single reported fever of 38.9C, but patient denied fever.  She reports longstanding chills, but denies rigors.  She was started on Vanc/CTX in the ED.  Meds: Current Facility-Administered Medications  Medication Dose Route Frequency Provider Last Rate Last Dose  . vancomycin (VANCOCIN) IVPB 750 mg/150 ml premix  750 mg Intravenous Once [name] Etienne, DO       Current Outpatient Prescriptions  Medication Sig Dispense Refill  . albuterol (PROAIR HFA) 108 (90 BASE) MCG/ACT inhaler Inhale 1-2 puffs into the lungs every 6 (six) hours as needed for wheezing or shortness of breath. 1 Inhaler 1  . azaTHIOprine (IMURAN) 50 MG tablet Take 50 mg by mouth daily.    . calcium carbonate (TUMS - DOSED IN MG ELEMENTAL CALCIUM) 500 MG chewable tablet Chew 1,000 tablets by mouth at bedtime.    . cetirizine (ZYRTEC) 10 MG tablet Take 1 tablet (10 mg total) by mouth daily as needed for allergies. 30 tablet 3  . CREON 12000 UNITS CPEP capsule Take 1 capsule (12,000 Units total) by mouth 3 (three) times daily before meals. 270 capsule 3  . FLUoxetine (PROZAC) 10 MG capsule Take 1 capsule (10 mg total) by mouth daily. For depression 90 capsule 3  . folic acid (FOLVITE) 1 MG tablet Take 1 tablet (1 mg  total) by mouth daily. For folic acid replacement 30 tablet 6  . gabapentin (NEURONTIN) 300 MG capsule Take 1 capsule (300 mg total) by mouth daily. 90 capsule 3  . hydroxychloroquine (PLAQUENIL) 200 MG tablet Take 400 mg by mouth daily.    . Lactobacillus (ACIDOPHILUS PROBIOTIC PO) Take 2 mg by mouth 3 (three) times daily. Take 2 (two) tablets by mouth three times daily    . levETIRAcetam (KEPPRA) 250 MG tablet Take 1 tablet (250 mg total) by mouth daily. Take additional 500mg  (2 tablets) after Dialysis treatments on Monday, Wednesday, Friday 30 tablet 0  . mirtazapine (REMERON) 7.5 MG tablet Take 1 tablet (7.5 mg total) by mouth at bedtime. 90 tablet 3  . Multiple Vitamin (MULTIVITAMIN WITH MINERALS) TABS tablet Take 1  tablet by mouth daily. For vitamin replacement 90 tablet 3  . omeprazole (PRILOSEC) 40 MG capsule Take 1 capsule (40 mg total) by mouth daily. 90 capsule 3  . thiamine (VITAMIN B-1) 100 MG tablet Take 1 tablet (100 mg total) by mouth daily. For low thiamine 90 tablet 3  . vitamin B-12 (CYANOCOBALAMIN) 250 MCG tablet Take 1 tablet (250 mcg total) by mouth every evening. 90 tablet 3  . camphor-menthol (SARNA) lotion Apply 1 application topically daily as needed for itching.    . loperamide (IMODIUM) 2 MG capsule Take 2 mg by mouth every 6 (six) hours as needed for diarrhea or loose stools.    . nystatin (MYCOSTATIN) 100000 UNIT/ML suspension Take 5 mLs (500,000 Units total) by mouth 4 (four) times daily. 60 mL 0    Allergies: Allergies as of 07/30/2015 - Review Complete 07/30/2015  Allergen Reaction Noted  . Amitriptyline hcl Swelling   . Doxycycline hyclate Itching 11/10/2010   Past Medical History  Diagnosis Date  . Anemia, B12 deficiency   . History of acute pancreatitis   . Right knee pain     No recent imaging on chart  . 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 Dr. Mancel [name], [facility]  . Hypertriglyceridemia   . GERD (gastroesophageal reflux disease)   . Subdural hematoma ([place]) 02/2008    Likely 2/2 trauma from seizure from EtOH withdrawal, chronic in nature, sees Dr. Jerene [name]. Most recent CT head 10/2009 showing stable but persistent hematoma without mass effect.  . History of seizure disorder     Likely 2/2 alcohol abuse  . Hypocalcemia   . Hypomagnesemia   . Failure to thrive in childhood     Unclear etiology  . HTN (hypertension)   . Thrombocytopenia ([place])   . Hepatomegaly     On exam  . Joint pain   . Alcohol abuse   . Vitamin D deficiency   . Pancreatitis   . Insomnia   . Hyperlipidemia   . Pernicious anemia   . Macrocytic anemia   . 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 Depression   . Fx humeral  neck 04/17/2011    Transverse fracture- minimally displaced- managed as outpatient   . ABNORMAL PAP SMEAR, LGSIL 07/23/2008    Annotation: HPV positive CIN I Dr. Mancel [name], [facility] Qualifier: Diagnosis of  By: Oretha Ellis    . Pneumonia 05/20/2012  . Arthritis     "shoulders" (08/15/2013)  . 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  Marland Kitchen Chronic diastolic CHF (congestive heart failure) ([place])   .  Hypomagnesemia   . Seizures ([place])     "don't know when/why I had them; daughter was always there w/me"  . On home oxygen therapy     "3L; mostly at night" (06/19/2014)  . Shortness of breath dyspnea   . Pleural effusion   . Renal insufficiency    Past Surgical History  Procedure Laterality Date  . Cesarean section  1983  . Esophagogastroduodenoscopy  07/11/2011    Procedure: ESOPHAGOGASTRODUODENOSCOPY (EGD);  Surgeon: Beryle Beams, MD;  Location: [facility];  Service: Endoscopy;  Laterality: N/A;  . Colonoscopy  07/11/2011    Procedure: COLONOSCOPY;  Surgeon: Beryle Beams, MD;  Location: [facility] ENDOSCOPY;  Service: Endoscopy;  Laterality: N/A;  . Eye surgery Left     "trauma"  . Right colectomy  08/28/2011  . Esophagogastroduodenoscopy N/A 12/01/2012    Procedure: ESOPHAGOGASTRODUODENOSCOPY (EGD);  Surgeon: Irene Shipper, MD;  Location: [facility];  Service: Endoscopy;  Laterality: N/A;  . Colonoscopy with esophagogastroduodenoscopy (egd) Left 08/21/2013    Procedure: COLONOSCOPY WITH ESOPHAGOGASTRODUODENOSCOPY (EGD);  Surgeon: Beryle Beams, MD;  Location: [facility] ENDOSCOPY;  Service: Endoscopy;  Laterality: Left;  . Subxyphoid pericardial window N/A 12/04/2013    Procedure: SUBXYPHOID PERICARDIAL WINDOW WITH TEE;  Surgeon: Ivin Poot, MD;  Location: [place];  Service: Thoracic;  Laterality: N/A;  . Intraoperative transesophageal echocardiogram N/A 12/04/2013    Procedure: INTRAOPERATIVE TRANSESOPHAGEAL ECHOCARDIOGRAM;  Surgeon: Ivin Poot, MD;  Location: [place];  Service: Open Heart Surgery;  Laterality: N/A;  . Exchange of a dialysis catheter Left 10/22/2014    Procedure: EXCHANGE OF A DIALYSIS CATHETER;  Surgeon: Conrad Tift, MD;  Location: [place];  Service: Vascular;  Laterality: Left;  . Av fistula placement Left 12/03/2014    Procedure: INSERTION OF ARTERIOVENOUS (AV) GORE-TEX GRAFT ARM;  Surgeon: Mal Misty, MD;  Location[facility] OR;  Service: Vascular;  Laterality: Left;   Family History  Problem Relation Age of Onset  . Cancer Mother     Died from stomach cancer and "flesh eating rash  . Heart failure Father     Died in 35s from an MI  . Alcohol abuse Sister     Twin sister drinks a lot, as did both her parents and brothers  . Stroke Brother     Has 7 brothers, 1 with CVA  . Lupus Mother    Social History   Social History  . Marital Status: Divorced    Spouse Name: N/A  . Number of Children: N/A  . Years of Education: N/A   Occupational History  . Not on file.   Social History Main Topics  . Smoking status: Former Smoker -- 0.50 packs/day for 40 years    Types: Cigarettes    Quit date: 09/20/2010  . Smokeless tobacco: Never Used  . Alcohol Use: 0.0 oz/week    0 Standard drinks or equivalent per week     Comment: 12/02/14 "graduated from [employer] (for alcohol abuse) in October 2015"   . Drug Use: Yes    Special: Marijuana, Cocaine     Comment: 12/02/14 "last drug use was in 2012"  . Sexual Activity: Not Currently   Other Topics Concern  . Not on file   Social History Narrative   Lives with her significant other and 2 grandchildren. 1 child   Has 7 brothers and 4 sisters, 1 twin sister.   Unemployed, worked in [employer].    Abuses alcohol-drinks 1  glass of wine daily    No drug use. Former cigarette use quit 1.5 years ago.     11 th grade education             Review of Systems: Pertinent items noted in HPI and remainder of comprehensive ROS otherwise negative.  Physical  Exam: Blood pressure 119/76, pulse 110, temperature 102.1 F (38.9 C), temperature source Oral, resp. rate 20, SpO2 100 %. Physical Exam  Constitutional: She is oriented to person, place, and time.  Thin, cachectic, chronically ill appearing female, lying in bed, NAD  HENT:  Head: Normocephalic and atraumatic.  Eyes: EOM are normal. No scleral icterus.  Neck: No JVD present. No tracheal deviation present.  Cardiovascular: Intact distal pulses.   Tachycardic.  Regular rhythm.  Grade II/VI systolic crescendo-decrescendo murmur heard best at RUSB radiating to carotids.  Harsh blowing murmur over LUE AVG.  Pulmonary/Chest: No stridor. No respiratory distress.  Poor inspiratory effort.  No wheezes or crackles appreciated.  Abdominal: Soft. She exhibits no distension. There is no tenderness. There is no rebound and no guarding.  Musculoskeletal: She exhibits no edema.  Neurological: She is alert and oriented to person, place, and time.  Skin: Skin is warm and dry.     Lab results: Basic Metabolic Panel:  Recent Labs  07/30/15 0955 07/30/15 1004  NA 137 139  K 4.7 4.7  CL 100* 98*  CO2 21*  --   GLUCOSE 117* 112*  BUN <5* 4*  CREATININE 1.81* 1.70*  CALCIUM 7.4*  --    Liver Function Tests:  Recent Labs  07/30/15 1349  AST 44*  ALT 9*  ALKPHOS 386*  BILITOT 0.6  PROT 6.8  ALBUMIN 1.4*   No results for input(s): LIPASE, AMYLASE in the last 72 hours. No results for input(s): AMMONIA in the last 72 hours. CBC:  Recent Labs  07/30/15 0955 07/30/15 1004  WBC 9.2  --   NEUTROABS 7.6  --   HGB 12.0 14.6  HCT 36.7 43.0  MCV 105.8*  --   PLT 60*  --    Cardiac Enzymes: No results for input(s): CKTOTAL, CKMB, CKMBINDEX, TROPONINI in the last 72 hours. BNP: No results for input(s): PROBNP in the last 72 hours. D-Dimer: No results for input(s): DDIMER in the last 72 hours. CBG: No results for input(s): GLUCAP in the last 72 hours. Hemoglobin A1C: No results for  input(s): HGBA1C in the last 72 hours. Fasting Lipid Panel: No results for input(s): CHOL, HDL, LDLCALC, TRIG, CHOLHDL, LDLDIRECT in the last 72 hours. Thyroid Function Tests: No results for input(s): TSH, T4TOTAL, FREET4, T3FREE, THYROIDAB in the last 72 hours. Anemia Panel: No results for input(s): VITAMINB12, FOLATE, FERRITIN, TIBC, IRON, RETICCTPCT in the last 72 hours. Coagulation:  Recent Labs  07/30/15 1349  LABPROT 23.5*  INR 2.12*   Urine Drug Screen: Drugs of Abuse     Component Value Date/Time   LABOPIA NONE DETECTED 07/16/2015 1400   LABOPIA NEG 09/18/2011 0936   COCAINSCRNUR NONE DETECTED 07/16/2015 1400   COCAINSCRNUR NEG 09/18/2011 0936   LABBENZ NONE DETECTED 07/16/2015 1400   LABBENZ NEG 09/18/2011 0936   LABBENZ NEG 04/10/2011 1130   AMPHETMU NONE DETECTED 07/16/2015 1400   AMPHETMU NEG 09/18/2011 0936   AMPHETMU NEG 04/10/2011 1130   THCU NONE DETECTED 07/16/2015 1400   THCU NEG 09/18/2011 0936   LABBARB NONE DETECTED 07/16/2015 1400   LABBARB NEG 09/18/2011 0936    Alcohol Level: No results for  input(s): ETH in the last 72 hours. Urinalysis: No results for input(s): COLORURINE, LABSPEC, PHURINE, GLUCOSEU, HGBUR, BILIRUBINUR, KETONESUR, PROTEINUR, UROBILINOGEN, NITRITE, LEUKOCYTESUR in the last 72 hours.  Invalid input(s): APPERANCEUR Misc. Labs:   Imaging results:  Ct Abdomen Pelvis Wo Contrast  07/30/2015  CLINICAL DATA:  Room and was provided with item leaking no go with Nausea and vomiting during dialysis today. Sharp generalized abdominal pain during that time. EXAM: CT ABDOMEN AND PELVIS WITHOUT CONTRAST TECHNIQUE: Multidetector CT imaging of the abdomen and pelvis was performed following the standard protocol without IV contrast. COMPARISON:  No comparison studies available. FINDINGS: Lower chest:  Unremarkable Hepatobiliary: Liver is 19 cm in craniocaudal length. Subtle nodularity of the hepatic contour raises the question of cirrhosis. Layering  tiny stones are noted in the lumen of the gallbladder. No intrahepatic or extrahepatic biliary dilation. Pancreas: Numerous coarse dystrophic calcifications are seen throughout the pancreas with dilatation of the main pancreatic duct. Pancreatic parenchyma is diffusely atrophic. Spleen: No splenomegaly. No focal mass lesion. Adrenals/Urinary Tract: No adrenal nodule or mass. Kidneys are unremarkable. No hydronephrosis. No evidence for hydroureter. The urinary bladder appears normal for the degree of distention. Stomach/Bowel: Stomach is nondistended. No gastric wall thickening. No evidence of outlet obstruction. Duodenum is normally positioned as is the ligament of Treitz. No small bowel wall thickening. No small bowel dilatation. Patient is status post right hemicolectomy. Remaining segments of the colon show mild circumferential wall thickening and overall the colon appears featureless. There is no substantial pericolonic edema or inflammation. Vascular/Lymphatic: No abdominal aortic aneurysm. No abdominal atherosclerotic calcification. There is no gastrohepatic or hepatoduodenal ligament lymphadenopathy. No intraperitoneal or retroperitoneal lymphadenopathy. Multiple small lymph nodes are seen in the retroperitoneal space. No pelvic sidewall lymphadenopathy. Scattered small lymph nodes are seen in the inguinal regions bilaterally. Reproductive: The uterus has normal CT imaging appearance. There is no adnexal mass. Other: No intraperitoneal free fluid. Musculoskeletal: Bone windows reveal no worrisome lytic or sclerotic osseous lesions. IMPRESSION: 1. Featureless colon with suggestion of mild diffuse circumferential wall thickening. Underlying infectious/inflammatory colitis would be a consideration. 2. Mild hepatomegaly. Liver contour is nodular raising concern for cirrhosis. 3. Dystrophic calcifications seen throughout the pancreas with dilatation of the main pancreatic duct. No discrete mass lesion is seen in  the head of the pancreas on this study without IV contrast serial. Features may be related to chronic pain is. MRI without and with contrast could be used to further evaluate and exclude pancreatic head lesion as etiology for the dilatation of the main pancreatic duct. 4. Cholelithiasis. Electronically Signed   By: Misty Stanley M.D.   On: 07/30/2015 14:58   Dg Chest 2 View  07/30/2015  CLINICAL DATA:  Dizziness, fell down, nausea, dialysis patient, elevated lactate, hypertension EXAM: CHEST  2 VIEW COMPARISON:  07/01/2015 FINDINGS: Enlargement of cardiac silhouette. Mediastinal contours and pulmonary vascularity normal. Lungs emphysematous but clear. No infiltrate, pleural effusion or pneumothorax. Diffuse osseous demineralization. IMPRESSION: Emphysematous changes without acute infiltrate. Electronically Signed   By: Lavonia Dana M.D.   On: 07/30/2015 12:50    Other results: EKG: sinus tachycardia, poor R-wave progression.  Assessment & [name] by Problem: Principal Problem:   Hematemesis Active Problems:   PANCREATITIS   Seizure disorder (HCC)   Severe protein-calorie malnutrition (HCC)   Hypoalbuminemia   Alcohol dependence ([place])   ESRD on dialysis [facility])  Ms. 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   Hematemesis: Patient presents with blood streaked emesis during dialysis. She denies frank BRB emesis.  She has a h/o chronic pancreatitis and alcohol abuse without evidence of gastric or esophageal varices on EGD in 2015, only showing gastric a bleeding AVM.  In 2014, she had a Mallory-Weiss tear.  She has not had a repeat episode of hematemesis since presentation and hemoglobin stable.  CT abdomen without contrast notable for dilation of pancreatic duct.  MRI with/without contrast suggested to r/o pancreatic mass.   GI consult does not think further workup is warranted.  Will continue to monitor patient for repeat episodes and ensure hemoglobin stable.  Patient should continue alcohol cessation and have MRI as an outpatient. [ ]  CBC in AM [ ]  INR - [place] - Transfuse Hgb < 7 - Protonix 40 mg IV q12h - Zofran  Fever: Patient denies subjective fevers, but has documented Tmax of 38.9 C and elevated lactate to 8.2.  WBC is not abnormally elevated at 9.2, but elevated in comparison to her baseline (3-5). She has a h/o elevated lactate in Epic, likely 2/2 continued alcohol abuse.  In her January admission, she had fever of unknown origin and was treated with Vanc/Zosyn.  BCx and GI pathogen panel were negative.  Etiology of fever at that time was possibly due to plaquenil nonadherence 2/2 cost.  Now she continues to complain of intermittent diarrhea, which is likely 2/2 chronic pancreatitis/pancreatitic insufficiency.  However, CT abdomen also notes diffuse circumferential colonic wall thickening c/f inflammation/infection.  Therefore, will check C diff PCR.  She also has a h/o AVG reaction vs infection in July 2016.  Blood cultures were taken in the ED prior to starting Vanc/CTX and she was fluid resuscitated with 1L NS.  Again though, her SLE is another possible etiology. We will further investigate her medication adherence in the morning.  We will await blood cultures and C diff results to guide future antibiotic selection.  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 She received 3 hours dialysis today.  Electrolytes not concerning for emergent dialysis. - Imuran 50 mg  - Plaquenil 400 mg   Thrombocytopenia: Platelets chronically low (70-140).  Transfusion threshold < 30 and bleeding. - CTM  Chronic Pancreatitis and Alcohol Use: Last drink 2/23.  Diarrhea likely 2/2 pancreatic insufficiency, but patient also has documented  fever, raising concern for infection. Will check C diff as above. - CIWA - Creon - Thiamine, folate, B12  Severe Protein-Calorie Malnutrition: Remeron  FEN/GI: - Renal diet  DVT Ppx: contraindicated for platelets of 60  Dispo: Disposition is deferred at this time, awaiting improvement of current medical problems. Anticipated discharge in approximately 1-2 day(s).   The patient does have a current PCP [name] Leff, MD) and does need an [facility] hospital follow-up appointment after discharge.  The patient does not have transportation limitations that hinder transportation to clinic appointments.  Signed: Iline Oven, MD, PhD 07/30/2015, 4:29 PM

## 2015-07-31 ENCOUNTER — Encounter (HOSPITAL_COMMUNITY): Payer: Self-pay | Admitting: General Practice

## 2015-07-31 DIAGNOSIS — N186 End stage renal disease: Secondary | ICD-10-CM

## 2015-07-31 DIAGNOSIS — K920 Hematemesis: Secondary | ICD-10-CM | POA: Diagnosis not present

## 2015-07-31 DIAGNOSIS — F1020 Alcohol dependence, uncomplicated: Secondary | ICD-10-CM

## 2015-07-31 DIAGNOSIS — F1010 Alcohol abuse, uncomplicated: Secondary | ICD-10-CM | POA: Diagnosis not present

## 2015-07-31 DIAGNOSIS — D696 Thrombocytopenia, unspecified: Secondary | ICD-10-CM

## 2015-07-31 DIAGNOSIS — Z992 Dependence on renal dialysis: Secondary | ICD-10-CM | POA: Diagnosis not present

## 2015-07-31 DIAGNOSIS — M3214 Glomerular disease in systemic lupus erythematosus: Secondary | ICD-10-CM | POA: Diagnosis not present

## 2015-07-31 DIAGNOSIS — R509 Fever, unspecified: Secondary | ICD-10-CM | POA: Diagnosis not present

## 2015-07-31 DIAGNOSIS — E43 Unspecified severe protein-calorie malnutrition: Secondary | ICD-10-CM | POA: Diagnosis not present

## 2015-07-31 DIAGNOSIS — K860 Alcohol-induced chronic pancreatitis: Secondary | ICD-10-CM

## 2015-07-31 DIAGNOSIS — A047 Enterocolitis due to Clostridium difficile: Secondary | ICD-10-CM | POA: Diagnosis not present

## 2015-07-31 DIAGNOSIS — G40909 Epilepsy, unspecified, not intractable, without status epilepticus: Secondary | ICD-10-CM | POA: Diagnosis not present

## 2015-07-31 LAB — COMPREHENSIVE METABOLIC PANEL
ALK PHOS: 389 U/L — AB (ref 38–126)
ALT: 9 U/L — ABNORMAL LOW (ref 14–54)
ANION GAP: 10 (ref 5–15)
AST: 74 U/L — ABNORMAL HIGH (ref 15–41)
Albumin: 1.2 g/dL — ABNORMAL LOW (ref 3.5–5.0)
BUN: 5 mg/dL — ABNORMAL LOW (ref 6–20)
CO2: 19 mmol/L — AB (ref 22–32)
Calcium: 6.6 mg/dL — ABNORMAL LOW (ref 8.9–10.3)
Chloride: 108 mmol/L (ref 101–111)
Creatinine, Ser: 2.88 mg/dL — ABNORMAL HIGH (ref 0.44–1.00)
GFR calc non Af Amer: 17 mL/min — ABNORMAL LOW (ref >60)
GFR, EST AFRICAN AMERICAN: 20 mL/min — AB (ref >60)
Glucose, Bld: 144 mg/dL — ABNORMAL HIGH (ref 65–99)
Potassium: 3.5 mmol/L (ref 3.5–5.1)
SODIUM: 137 mmol/L (ref 135–145)
Total Bilirubin: 1.4 mg/dL — ABNORMAL HIGH (ref 0.3–1.2)
Total Protein: 6 g/dL — ABNORMAL LOW (ref 6.5–8.1)

## 2015-07-31 LAB — CBC
HCT: 27.8 % — ABNORMAL LOW (ref 36.0–46.0)
HCT: 25 % — ABNORMAL LOW (ref 36.0–46.0)
HEMOGLOBIN: 8.9 g/dL — AB (ref 12.0–15.0)
HEMOGLOBIN: 8.3 g/dL — AB (ref 12.0–15.0)
MCH: 33 pg (ref 26.0–34.0)
MCH: 34.7 pg — AB (ref 26.0–34.0)
MCHC: 32 g/dL (ref 30.0–36.0)
MCHC: 33.2 g/dL (ref 30.0–36.0)
MCV: 103 fL — ABNORMAL HIGH (ref 78.0–100.0)
MCV: 104.6 fL — ABNORMAL HIGH (ref 78.0–100.0)
Platelets: 52 10*3/uL — ABNORMAL LOW (ref 150–400)
Platelets: 42 10*3/uL — ABNORMAL LOW (ref 150–400)
RBC: 2.7 MIL/uL — AB (ref 3.87–5.11)
RBC: 2.39 MIL/uL — ABNORMAL LOW (ref 3.87–5.11)
RDW: 18.2 % — ABNORMAL HIGH (ref 11.5–15.5)
RDW: 18.5 % — AB (ref 11.5–15.5)
WBC: 8 10*3/uL (ref 4.0–10.5)
WBC: 14.4 10*3/uL — ABNORMAL HIGH (ref 4.0–10.5)

## 2015-07-31 LAB — C DIFFICILE QUICK SCREEN W PCR REFLEX
C DIFFICILE (CDIFF) INTERP: POSITIVE
C DIFFICLE (CDIFF) ANTIGEN: POSITIVE — AB
C Diff toxin: POSITIVE — AB

## 2015-07-31 LAB — PROTIME-INR
INR: 2.54 — AB (ref 0.00–1.49)
Prothrombin Time: 27 seconds — ABNORMAL HIGH (ref 11.6–15.2)

## 2015-07-31 LAB — LACTIC ACID, PLASMA
LACTIC ACID, VENOUS: 7.1 mmol/L — AB (ref 0.5–2.0)
LACTIC ACID, VENOUS: 5.1 mmol/L — AB (ref 0.5–2.0)
Lactic Acid, Venous: 3.7 mmol/L (ref 0.5–2.0)
Lactic Acid, Venous: 4 mmol/L (ref 0.5–2.0)
Lactic Acid, Venous: 3.2 mmol/L (ref 0.5–2.0)

## 2015-07-31 LAB — MRSA PCR SCREENING: MRSA by PCR: NEGATIVE

## 2015-07-31 LAB — OCCULT BLOOD X 1 CARD TO LAB, STOOL: FECAL OCCULT BLD: NEGATIVE

## 2015-07-31 MED ORDER — VANCOMYCIN 50 MG/ML ORAL SOLUTION
125.0000 mg | Freq: Four times a day (QID) | ORAL | Status: DC
  Administered 2015-07-31 – 2015-08-02 (×7): 125 mg via ORAL
  Filled 2015-07-31 (×9): qty 2.5

## 2015-07-31 MED ORDER — SODIUM CHLORIDE 0.9 % IV BOLUS (SEPSIS)
250.0000 mL | Freq: Once | INTRAVENOUS | Status: AC
  Administered 2015-07-31: 250 mL via INTRAVENOUS

## 2015-07-31 MED ORDER — ACETAMINOPHEN 325 MG PO TABS: 650.0000 mg | ORAL_TABLET | Freq: Three times a day (TID) | ORAL | Status: DC | PRN

## 2015-07-31 MED ORDER — CEFTRIAXONE SODIUM 1 G IJ SOLR
1.0000 g | INTRAMUSCULAR | Status: DC
  Administered 2015-07-31: 1 g via INTRAVENOUS
  Filled 2015-07-31 (×2): qty 10

## 2015-07-31 MED ORDER — SODIUM CHLORIDE 0.9 % IV SOLN
INTRAVENOUS | Status: AC
  Administered 2015-07-31: 1000 mL via INTRAVENOUS

## 2015-07-31 MED ORDER — SUCRALFATE 1 GM/10ML PO SUSP
1.0000 g | Freq: Three times a day (TID) | ORAL | Status: DC
  Administered 2015-07-31 – 2015-08-01 (×5): 1 g via ORAL
  Filled 2015-07-31 (×7): qty 10

## 2015-07-31 MED ORDER — SODIUM CHLORIDE 0.9 % IV BOLUS (SEPSIS)
500.0000 mL | Freq: Once | INTRAVENOUS | Status: AC
Start: 2015-07-31 — End: 2015-07-31
  Administered 2015-07-31: 500 mL via INTRAVENOUS

## 2015-07-31 NOTE — Progress Notes (Signed)
CRITICAL VALUE ALERT  Critical value received:  Lactic acid  Date of notification:  07/31/2015  Time of notification:  20:31  Critical value read back:Yes.    Nurse who received alert:  Estanislado Pandy  MD notified (1st page):  Dr Juleen China  Time of first page:  22:30  MD notified (2nd page):  Time of second page:  Responding MD:  Dr Juleen China  Time MD responded:  22:35

## 2015-07-31 NOTE — Progress Notes (Signed)
CRITICAL VALUE ALERT  Critical value received:  Lactic acid: 5.1  Date of notification:  07/30/2014  Time of notification:  [id number]  Critical value read back:Yes.    Nurse who received alert:  Clint Lipps   MD notified (1st page):  Dr. Lovena Le  Time of first page:  [phone]  MD notified (2nd page):  Time of second page:  Responding MD:  Dr. Lovena Le  Time MD responded:  [phone]

## 2015-07-31 NOTE — Progress Notes (Signed)
Pharmacy Antibiotic Note  [name] [name] is a 60 y.o. female admitted on 07/30/2015 with cdiff.  Pharmacy has been consulted for po Vancomycin dosing. Pt with ESRD.  Plan: Vancomycin 125mg  po QID x 14 days F/u pt's clinical condition  Weight: 87 lb 15.4 oz (39.9 kg)  Temp (24hrs), Avg:99.8 F (37.7 C), Min:97.9 F (36.6 C), Max:102.6 F (39.2 C)   Recent Labs Lab 07/30/15 0955 07/30/15 1004  07/30/15 2323 07/31/15 0538 07/31/15 0917 07/31/15 1430 07/31/15 1919  WBC 9.2  --   --   --  8.0  --  14.4*  --   CREATININE 1.81* 1.70*  --   --  2.88*  --   --   --   LATICACIDVEN  --   --   < > 7.1* 3.7* 4.0* 5.1* 3.2*  < > = values in this interval not displayed.  Estimated Creatinine Clearance: 13.4 mL/min (by C-G formula based on Cr of 2.88).    Allergies  Allergen Reactions  . Amitriptyline Hcl Swelling    In the face.  . Doxycycline Hyclate Itching    Feels like something crawling under her skin    Antimicrobials this admission: 2/25 Rocephin >>  2/25 po vanc  >> 3/11  Microbiology results: 2/24 BCx:  2/24 UCx:   2/25 cdiff: positive  2/24 MRSA PCR: negative  Thank you for allowing pharmacy to be a part of this patient's care.  Sherlon Handing, PharmD, BCPS Clinical pharmacist, pager [phone] 07/31/2015 10:55 PM

## 2015-07-31 NOTE — Progress Notes (Signed)
Pt had bright red blood noted on her loose stool. No noted tarry stool.

## 2015-07-31 NOTE — Progress Notes (Signed)
Subjective: Overnight, patient has remained febrile to Tmax 39.3C.  She had a loose stool this morning, brown in color.  She denies nausea or vomiting.  She currently denies complaint.  Objective: Vital signs in last 24 hours: Filed Vitals:   07/30/15 1843 07/30/15 2048 07/31/15 0112 07/31/15 0658  BP: 127/75 117/65 91/52 77/46   Pulse: 104 135 133 108  Temp: 101.3 F (38.5 C) 102.7 F (39.3 C) 102.6 F (39.2 C) 97.9 F (36.6 C)  TempSrc: Oral Oral Oral Oral  Resp:    20  Weight:    87 lb 15.4 oz (39.9 kg)  SpO2: 93% 100% 100% 100%   Weight change:   Intake/Output Summary (Last 24 hours) at 07/31/15 1057 Last data filed at 07/30/15 1757  Gross per 24 hour  Intake   1300 ml  Output      0 ml  Net   1300 ml   Physical Exam  Constitutional: She is oriented to person, place, and time.  Frail, cachectic, chronically ill appearing female, lying in bed, NAD.  HENT:  Head: Normocephalic and atraumatic.  Eyes: EOM are normal. No scleral icterus.  Neck: No JVD present. No tracheal deviation present.  Cardiovascular:  Tachycardic. Regular rhythm. Grade II/VI systolic decrescendo murmur heard best at RUSB.  Harsh Grade IV/VI murmur over LUE AVG auscultated at apex.  Pulmonary/Chest: No stridor.  Poor inspiratory effort.  No wheezes or crackles appreciated.  Abdominal: Soft. She exhibits no distension. There is no rebound and no guarding.  Moderate periumbilical tenderness to deep palpation.  Musculoskeletal: She exhibits no edema.  Neurological: She is alert and oriented to person, place, and time.  Skin: Skin is warm and dry.  No rash, sores, or decubitus ulcers.    Lab Results: Basic Metabolic Panel:  Recent Labs Lab 07/30/15 0955 07/30/15 1004 07/31/15 0538  NA 137 139 137  K 4.7 4.7 3.5  CL 100* 98* 108  CO2 21*  --  19*  GLUCOSE 117* 112* 144*  BUN <5* 4* 5*  CREATININE 1.81* 1.70* 2.88*  CALCIUM 7.4*  --  6.6*   Liver Function Tests:  Recent Labs Lab  07/30/15 1349 07/31/15 0538  AST 44* 74*  ALT 9* 9*  ALKPHOS 386* 389*  BILITOT 0.6 1.4*  PROT 6.8 6.0*  ALBUMIN 1.4* 1.2*   No results for input(s): LIPASE, AMYLASE in the last 168 hours. No results for input(s): AMMONIA in the last 168 hours. CBC:  Recent Labs Lab 07/30/15 0955 07/30/15 1004 07/31/15 0538  WBC 9.2  --  8.0  NEUTROABS 7.6  --   --   HGB 12.0 14.6 8.9*  HCT 36.7 43.0 27.8*  MCV 105.8*  --  103.0*  PLT 60*  --  52*   Cardiac Enzymes: No results for input(s): CKTOTAL, CKMB, CKMBINDEX, TROPONINI in the last 168 hours. BNP: No results for input(s): PROBNP in the last 168 hours. D-Dimer: No results for input(s): DDIMER in the last 168 hours. CBG: No results for input(s): GLUCAP in the last 168 hours. Hemoglobin A1C: No results for input(s): HGBA1C in the last 168 hours. Fasting Lipid Panel: No results for input(s): CHOL, HDL, LDLCALC, TRIG, CHOLHDL, LDLDIRECT in the last 168 hours. Thyroid Function Tests: No results for input(s): TSH, T4TOTAL, FREET4, T3FREE, THYROIDAB in the last 168 hours. Coagulation:  Recent Labs Lab 07/30/15 1349 07/31/15 0538  LABPROT 23.5* 27.0*  INR 2.12* 2.54*   Anemia Panel: No results for input(s): VITAMINB12, FOLATE, FERRITIN, TIBC, IRON,  RETICCTPCT in the last 168 hours. Urine Drug Screen: Drugs of Abuse     Component Value Date/Time   LABOPIA NONE DETECTED 07/16/2015 1400   LABOPIA NEG 09/18/2011 0936   COCAINSCRNUR NONE DETECTED 07/16/2015 1400   COCAINSCRNUR NEG 09/18/2011 0936   LABBENZ NONE DETECTED 07/16/2015 1400   LABBENZ NEG 09/18/2011 0936   LABBENZ NEG 04/10/2011 1130   AMPHETMU NONE DETECTED 07/16/2015 1400   AMPHETMU NEG 09/18/2011 0936   AMPHETMU NEG 04/10/2011 1130   THCU NONE DETECTED 07/16/2015 1400   THCU NEG 09/18/2011 0936   LABBARB NONE DETECTED 07/16/2015 1400   LABBARB NEG 09/18/2011 0936    Alcohol Level: No results for input(s): ETH in the last 168 hours. Urinalysis:  Recent  Labs Lab 07/30/15 1650  COLORURINE YELLOW  LABSPEC 1.009  PHURINE 7.0  GLUCOSEU NEGATIVE  HGBUR MODERATE*  BILIRUBINUR NEGATIVE  KETONESUR NEGATIVE  PROTEINUR 100*  NITRITE NEGATIVE  LEUKOCYTESUR MODERATE*   Misc. Labs:   Micro Results: Recent Results (from the past 240 hour(s))  MRSA PCR Screening     Status: None   Collection Time: 07/30/15  9:29 PM  Result Value Ref Range Status   MRSA by PCR NEGATIVE NEGATIVE Final    Comment:        The GeneXpert MRSA Assay (FDA approved for NASAL specimens only), is one component of a comprehensive MRSA colonization surveillance program. It is not intended to diagnose MRSA infection nor to guide or monitor treatment for MRSA infections.    Studies/Results: Ct Abdomen Pelvis Wo Contrast  07/30/2015  CLINICAL DATA:  Room and was provided with item leaking no go with Nausea and vomiting during dialysis today. Sharp generalized abdominal pain during that time. EXAM: CT ABDOMEN AND PELVIS WITHOUT CONTRAST TECHNIQUE: Multidetector CT imaging of the abdomen and pelvis was performed following the standard protocol without IV contrast. COMPARISON:  No comparison studies available. FINDINGS: Lower chest:  Unremarkable Hepatobiliary: Liver is 19 cm in craniocaudal length. Subtle nodularity of the hepatic contour raises the question of cirrhosis. Layering tiny stones are noted in the lumen of the gallbladder. No intrahepatic or extrahepatic biliary dilation. Pancreas: Numerous coarse dystrophic calcifications are seen throughout the pancreas with dilatation of the main pancreatic duct. Pancreatic parenchyma is diffusely atrophic. Spleen: No splenomegaly. No focal mass lesion. Adrenals/Urinary Tract: No adrenal nodule or mass. Kidneys are unremarkable. No hydronephrosis. No evidence for hydroureter. The urinary bladder appears normal for the degree of distention. Stomach/Bowel: Stomach is nondistended. No gastric wall thickening. No evidence of outlet  obstruction. Duodenum is normally positioned as is the ligament of Treitz. No small bowel wall thickening. No small bowel dilatation. Patient is status post right hemicolectomy. Remaining segments of the colon show mild circumferential wall thickening and overall the colon appears featureless. There is no substantial pericolonic edema or inflammation. Vascular/Lymphatic: No abdominal aortic aneurysm. No abdominal atherosclerotic calcification. There is no gastrohepatic or hepatoduodenal ligament lymphadenopathy. No intraperitoneal or retroperitoneal lymphadenopathy. Multiple small lymph nodes are seen in the retroperitoneal space. No pelvic sidewall lymphadenopathy. Scattered small lymph nodes are seen in the inguinal regions bilaterally. Reproductive: The uterus has normal CT imaging appearance. There is no adnexal mass. Other: No intraperitoneal free fluid. Musculoskeletal: Bone windows reveal no worrisome lytic or sclerotic osseous lesions. IMPRESSION: 1. Featureless colon with suggestion of mild diffuse circumferential wall thickening. Underlying infectious/inflammatory colitis would be a consideration. 2. Mild hepatomegaly. Liver contour is nodular raising concern for cirrhosis. 3. Dystrophic calcifications seen throughout the pancreas with dilatation  of the main pancreatic duct. No discrete mass lesion is seen in the head of the pancreas on this study without IV contrast serial. Features may be related to chronic pain is. MRI without and with contrast could be used to further evaluate and exclude pancreatic head lesion as etiology for the dilatation of the main pancreatic duct. 4. Cholelithiasis. Electronically Signed   By: Misty Stanley M.D.   On: 07/30/2015 14:58   Dg Chest 2 View  07/30/2015  CLINICAL DATA:  Dizziness, fell down, nausea, dialysis patient, elevated lactate, hypertension EXAM: CHEST  2 VIEW COMPARISON:  07/01/2015 FINDINGS: Enlargement of cardiac silhouette. Mediastinal contours and  pulmonary vascularity normal. Lungs emphysematous but clear. No infiltrate, pleural effusion or pneumothorax. Diffuse osseous demineralization. IMPRESSION: Emphysematous changes without acute infiltrate. Electronically Signed   By: Lavonia Dana M.D.   On: 07/30/2015 12:50   Medications: I have reviewed the patient's current medications. Scheduled Meds: . azaTHIOprine  50 mg Oral Daily  . calcium carbonate  500 mg Oral QHS  . cefTRIAXone (ROCEPHIN) IVPB 1 gram/50 mL D5W  1 g Intravenous Q24H  . FLUoxetine  10 mg Oral Daily  . folic acid  1 mg Oral Daily  . gabapentin  300 mg Oral Daily  . hydroxychloroquine  400 mg Oral Daily  . levETIRAcetam  250 mg Oral Daily   And  . levETIRAcetam  500 mg Oral Once per day on Mon Wed Fri  . lipase/protease/amylase  12,000 Units Oral TID AC  . mirtazapine  7.5 mg Oral QHS  . multivitamin with minerals  1 tablet Oral Daily  . pantoprazole (PROTONIX) IV  40 mg Intravenous Q12H  . sodium chloride  250 mL Intravenous Once  . sucralfate  1 g Oral TID WC & HS  . thiamine  100 mg Oral Daily   Or  . thiamine  100 mg Intravenous Daily  . vitamin B-12  250 mcg Oral QPM   Continuous Infusions:  PRN Meds:.acetaminophen, LORazepam **OR** LORazepam, ondansetron **OR** ondansetron (ZOFRAN) IV Assessment/Plan: Principal Problem:   Hematemesis Active Problems:   Alcohol abuse   Seizure disorder (HCC)   Severe protein-calorie malnutrition (HCC)   Hypoalbuminemia   FSGS (focal segmental glomerulosclerosis)   Anemia of chronic disease   Lupus (systemic lupus erythematosus) (HCC)   Diarrhea   ESRD (end stage renal disease) on dialysis (HCC)   Lactic acidosis   Essential hypertension   Fever, unspecified   GIB (gastrointestinal bleeding)  [name] [name] is a 60 yo female with 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   Fever: Patient denies subjective fevers, but has documented Tmax of 38.9 C and elevated lactate to 8.2. WBC is not abnormally elevated at 9.2, but elevated in comparison to her baseline (3-5). She has a h/o elevated lactate in Epic, likely 2/2 continued alcohol abuse. In her January admission, she had fever of unknown origin and was treated with Vanc/Zosyn. BCx and GI pathogen panel were negative. Etiology of fever at that time was possibly due to plaquenil nonadherence 2/2 cost. Now she continues to complain of intermittent diarrhea, which is likely 2/2 chronic pancreatitis/pancreatitic insufficiency. However, CT abdomen also notes diffuse circumferential colonic wall thickening c/f inflammation/infection. Therefore, will check C diff PCR. She also has a h/o AVG reaction vs infection in July 2016. SLE is another possible etiology. Skin exam benign.  Blood  cultures were taken in the ED prior to starting Vanc/CTX.  Lactate tended down with IVF, though BP becoming increasingly soft. Although patient not complaining of dysuria, her U/A is dirty and so we will treat empirically with CTX.  We will await blood cultures and C diff results to guide future antibiotic selection.  [ ]  BCx [ ]  UCx [ ]  C diff - Stop Vanc - CTX per pharmacy  Hematemesis: Patient presents with blood streaked emesis during dialysis. She denies frank BRB emesis. She has a h/o chronic pancreatitis and alcohol abuse without evidence of gastric or esophageal varices on EGD in 2015, only showing gastric bleeding AVM. In 2014, she had a Mallory-Weiss tear. She has not had a repeat episode of hematemesis since presentation and hemoglobin stable. CT abdomen without contrast notable for dilation of pancreatic duct. MRI with/without contrast suggested to r/o pancreatic mass. GI consult does not think further workup is warranted. Self-resolved Mallory-Weiss tear is thought to be the cause.  Will continue to monitor  patient for repeat episodes and ensure hemoglobin stable. Patient should continue alcohol cessation and have MRI as an outpatient.  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 She received 3 hours dialysis today. Electrolytes not concerning for emergent dialysis. - Imuran 50 mg  - Plaquenil 400 mg   Thrombocytopenia: Platelets chronically low (70-140). Transfusion threshold < 30 and bleeding. - CTM  Chronic Pancreatitis and Alcohol Use: Last drink 2/23. Diarrhea likely 2/2 pancreatic insufficiency, but patient also has documented fever, raising concern for infection. Will check C diff as above. - CIWA - Creon - Thiamine, folate, B12  Severe Protein-Calorie Malnutrition: Remeron  FEN/GI: - Renal diet  DVT Ppx: contraindicated for platelets of 60  Dispo: Disposition is deferred at this time, awaiting improvement of current medical problems.  Anticipated discharge in approximately 1-2 day(s).   The patient does have a current PCP Shela Leff, MD) and does need an [facility] hospital follow-up appointment after discharge.  The patient does not have transportation limitations that hinder transportation to clinic appointments.  .Services Needed at time of discharge: Y = Yes, Blank = No PT:   OT:   RN:   Equipment:   Other:     LOS: 1 day   Iline Oven, MD 07/31/2015, 10:57 AM

## 2015-07-31 NOTE — Discharge Summary (Signed)
Name: [name] [name] MRN: [id number] DOB: 11-24-1955 60 y.o. PCP: Shela Leff, MD  Date of Admission: 07/30/2015  9:42 AM Date of Discharge: 08/02/2015 Attending Physician: Aldine Contes, MD  Discharge Diagnosis: 1. Hematemesis 2. C diff colitis 3. ESBL E coli Bacteruria   Principal Problem:   Hematemesis Active Problems:   Alcohol abuse   Seizure disorder (HCC)   Severe protein-calorie malnutrition (HCC)   Hypoalbuminemia   FSGS (focal segmental glomerulosclerosis)   Anemia of chronic disease   Lupus (systemic lupus erythematosus) (HCC)   Diarrhea   ESRD (end stage renal disease) on dialysis (HCC)   Lactic acidosis   Essential hypertension   Fever, unspecified   GIB (gastrointestinal bleeding)  Discharge Medications:   Medication List    STOP taking these medications        loperamide 2 MG capsule  Commonly known as:  IMODIUM      TAKE these medications        ACIDOPHILUS PROBIOTIC PO  Take 2 mg by mouth 3 (three) times daily. Take 2 (two) tablets by mouth three times daily     albuterol 108 (90 Base) MCG/ACT inhaler  Commonly known as:  PROAIR HFA  Inhale 1-2 puffs into the lungs every 6 (six) hours as needed for wheezing or shortness of breath.     azaTHIOprine 50 MG tablet  Commonly known as:  IMURAN  Take 50 mg by mouth daily.     calcium carbonate 500 MG chewable tablet  Commonly known as:  TUMS - dosed in mg elemental calcium  Chew 1,000 tablets by mouth at bedtime.     camphor-menthol lotion  Commonly known as:  SARNA  Apply 1 application topically daily as needed for itching.     cetirizine 10 MG tablet  Commonly known as:  ZYRTEC  Take 1 tablet (10 mg total) by mouth daily as needed for allergies.     CREON 12000 units Cpep capsule  Generic drug:  lipase/protease/amylase  Take 1 capsule (12,000 Units total) by mouth 3 (three) times daily before meals.     Darbepoetin Alfa 150 MCG/0.3ML Sosy injection  Commonly known as:   ARANESP  Inject 0.3 mLs (150 mcg total) into the vein every Wednesday with hemodialysis.  Start taking on:  08/04/2015     FLUoxetine 10 MG capsule  Commonly known as:  PROZAC  Take 1 capsule (10 mg total) by mouth daily. For depression     folic acid 1 MG tablet  Commonly known as:  FOLVITE  Take 1 tablet (1 mg total) by mouth daily. For folic acid replacement     fosfomycin 3 g Pack  Commonly known as:  MONUROL  Take 3 g by mouth once. Take on August 05, 2015.     gabapentin 300 MG capsule  Commonly known as:  NEURONTIN  Take 1 capsule (300 mg total) by mouth daily.     hydroxychloroquine 200 MG tablet  Commonly known as:  PLAQUENIL  Take 400 mg by mouth daily.     levETIRAcetam 250 MG tablet  Commonly known as:  KEPPRA  Take 1 tablet (250 mg total) by mouth daily. Take additional 500mg  (2 tablets) after Dialysis treatments on Monday, Wednesday, Friday     mirtazapine 7.5 MG tablet  Commonly known as:  REMERON  Take 1 tablet (7.5 mg total) by mouth at bedtime.     multivitamin with minerals Tabs tablet  Take 1 tablet by mouth daily. For vitamin replacement  nystatin 100000 UNIT/ML suspension  Commonly known as:  MYCOSTATIN  Take 5 mLs (500,000 Units total) by mouth 4 (four) times daily.     omeprazole 40 MG capsule  Commonly known as:  PRILOSEC  Take 1 capsule (40 mg total) by mouth daily.     thiamine 100 MG tablet  Commonly known as:  VITAMIN B-1  Take 1 tablet (100 mg total) by mouth daily. For low thiamine     vancomycin 50 mg/mL oral solution  Commonly known as:  VANCOCIN  Take 2.5 mLs (125 mg total) by mouth 4 (four) times daily.     vitamin B-12 250 MCG tablet  Commonly known as:  CYANOCOBALAMIN  Take 1 tablet (250 mcg total) by mouth every evening.        Disposition and follow-up:   [name] [name] was discharged from [facility] in Stable condition.  At the hospital follow up visit please address:  1.  Dysuria, diarrhea,  antibiotic completion (PO Vancomycin AND Fosfomycin), alcohol use, medication adherence  2.  Labs / imaging needed at time of follow-up: INR, BMP  3.  Pending labs/ test needing follow-up: Urine culture sensitivities to Fosfomycin  Follow-up Appointments: Follow-up Information    Follow up with Dellia Nims, MD On 08/09/2015.   Specialty:  Internal Medicine   Why:  10:15a   Contact information:   [place] [address] [phone]       Discharge Instructions: Discharge Instructions    Call MD for:  difficulty breathing, headache or visual disturbances    Complete by:  As directed      Call MD for:  persistant dizziness or light-headedness    Complete by:  As directed      Call MD for:  persistant nausea and vomiting    Complete by:  As directed      Call MD for:  redness, tenderness, or signs of infection (pain, swelling, redness, odor or green/yellow discharge around incision site)    Complete by:  As directed      Call MD for:  temperature >100.4    Complete by:  As directed      Diet - low sodium heart healthy    Complete by:  As directed      Increase activity slowly    Complete by:  As directed            Consultations: Treatment Team:  Roney Jaffe, MD  Procedures Performed:  Ct Abdomen Pelvis Wo Contrast  07/30/2015  CLINICAL DATA:  Room and was provided with item leaking no go with Nausea and vomiting during dialysis today. Sharp generalized abdominal pain during that time. EXAM: CT ABDOMEN AND PELVIS WITHOUT CONTRAST TECHNIQUE: Multidetector CT imaging of the abdomen and pelvis was performed following the standard protocol without IV contrast. COMPARISON:  No comparison studies available. FINDINGS: Lower chest:  Unremarkable Hepatobiliary: Liver is 19 cm in craniocaudal length. Subtle nodularity of the hepatic contour raises the question of cirrhosis. Layering tiny stones are noted in the lumen of the gallbladder. No intrahepatic or extrahepatic  biliary dilation. Pancreas: Numerous coarse dystrophic calcifications are seen throughout the pancreas with dilatation of the main pancreatic duct. Pancreatic parenchyma is diffusely atrophic. Spleen: No splenomegaly. No focal mass lesion. Adrenals/Urinary Tract: No adrenal nodule or mass. Kidneys are unremarkable. No hydronephrosis. No evidence for hydroureter. The urinary bladder appears normal for the degree of distention. Stomach/Bowel: Stomach is nondistended. No gastric wall thickening. No evidence  of outlet obstruction. Duodenum is normally positioned as is the ligament of Treitz. No small bowel wall thickening. No small bowel dilatation. Patient is status post right hemicolectomy. Remaining segments of the colon show mild circumferential wall thickening and overall the colon appears featureless. There is no substantial pericolonic edema or inflammation. Vascular/Lymphatic: No abdominal aortic aneurysm. No abdominal atherosclerotic calcification. There is no gastrohepatic or hepatoduodenal ligament lymphadenopathy. No intraperitoneal or retroperitoneal lymphadenopathy. Multiple small lymph nodes are seen in the retroperitoneal space. No pelvic sidewall lymphadenopathy. Scattered small lymph nodes are seen in the inguinal regions bilaterally. Reproductive: The uterus has normal CT imaging appearance. There is no adnexal mass. Other: No intraperitoneal free fluid. Musculoskeletal: Bone windows reveal no worrisome lytic or sclerotic osseous lesions. IMPRESSION: 1. Featureless colon with suggestion of mild diffuse circumferential wall thickening. Underlying infectious/inflammatory colitis would be a consideration. 2. Mild hepatomegaly. Liver contour is nodular raising concern for cirrhosis. 3. Dystrophic calcifications seen throughout the pancreas with dilatation of the main pancreatic duct. No discrete mass lesion is seen in the head of the pancreas on this study without IV contrast serial. Features may be  related to chronic pain is. MRI without and with contrast could be used to further evaluate and exclude pancreatic head lesion as etiology for the dilatation of the main pancreatic duct. 4. Cholelithiasis. Electronically Signed   By: Misty Stanley M.D.   On: 07/30/2015 14:58   Dg Chest 2 View  07/30/2015  CLINICAL DATA:  Dizziness, fell down, nausea, dialysis patient, elevated lactate, hypertension EXAM: CHEST  2 VIEW COMPARISON:  07/01/2015 FINDINGS: Enlargement of cardiac silhouette. Mediastinal contours and pulmonary vascularity normal. Lungs emphysematous but clear. No infiltrate, pleural effusion or pneumothorax. Diffuse osseous demineralization. IMPRESSION: Emphysematous changes without acute infiltrate. Electronically Signed   By: Lavonia Dana M.D.   On: 07/30/2015 12:50   Ct Head Wo Contrast  07/16/2015  CLINICAL DATA:  Seizure today. EXAM: CT HEAD WITHOUT CONTRAST TECHNIQUE: Contiguous axial images were obtained from the base of the skull through the vertex without intravenous contrast. COMPARISON:  05/09/2015 FINDINGS: The brainstem, cerebellum, cerebral peduncles, thalami, basal ganglia, basilar cisterns, and ventricular system appear within normal limits. Periventricular white matter and corona radiata hypodensities favor chronic ischemic microvascular white matter disease. Mild cerebral atrophy for age. No intracranial hemorrhage, mass lesion, or acute CVA. Frothy material in the left sphenoid sinus. Aplastic frontal sinuses. IMPRESSION: 1. Persistent frothy material in the left sphenoid sinus favoring chronic sinusitis. 2. No acute intracranial findings. 3. Periventricular white matter and corona radiata hypodensities favor chronic ischemic microvascular white matter disease. 4. Mild cerebral atrophy for age. Electronically Signed   By: Van Clines M.D.   On: 07/16/2015 14:40   Ct Lumbar Spine W Contrast  07/15/2015  ADDENDUM REPORT: 07/15/2015 15:32 ADDENDUM: Study discussed by telephone  with Dr. Denton Brick (for Dr. Gilles Chiquito ) on 07/15/2015 at 1530 hours. Electronically Signed   By: Genevie Ann M.D.   On: 07/15/2015 15:32  07/15/2015  CLINICAL DATA:  60 year old female with fever of unknown origin, fecal incontinence, numbness radiating down both lower extremities. Immunocompromised. Initial encounter. EXAM: CT LUMBAR SPINE WITH CONTRAST TECHNIQUE: Multidetector CT imaging of the lumbar spine was performed with intravenous contrast administration. Multiplanar CT image reconstructions were also generated. CONTRAST:  12mL OMNIPAQUE IOHEXOL 300 MG/ML  SOLN COMPARISON:  CT Abdomen and Pelvis 07/13/2011 FINDINGS: Chronic calcific pancreatitis. The gallbladder today is abnormal with wall thickening and/or abundant pericholecystic fluid. Similar fluid and stranding at the porta  hepatis. No definite CBD enlargement. There is a right abdominal neo terminal ileum which appears to be new. Other visible abdominal viscera are stable since 2013. Portal venous system appears patent. Aortoiliac calcified atherosclerosis noted. Visualized major arterial structures are patent. No pericardial or pleural effusion.  Negative visualized lung bases. Normal lumbar segmentation. Preserved vertebral height and alignment. Nonspecific mildly sclerotic an heterogeneous bone mineralization diffusely. Visible sacrum and SI joints intact. No acute osseous abnormality identified. No lower thoracic or lumbar spinal stenosis. Normal CT appearance of the visible epidural space. No paraspinal inflammatory stranding. IMPRESSION: 1. Abnormal gallbladder with inflammation extending to the porta hepatis. Consider acute cholecystitis and recommend follow-up imaging with either abdomen ultrasound or CT Abdomen and Pelvis (oral and IV contrast preferred). 2. No acute findings by CT in the lumbar spine. 3. Chronic calcific pancreatitis. Electronically Signed: By: Genevie Ann M.D. On: 07/15/2015 15:18   US Abdomen Limited Ruq  07/16/2015  CLINICAL  DATA:  Fever. History of chronic pancreatitis. Initial encounter. EXAM: US ABDOMEN LIMITED - RIGHT UPPER QUADRANT COMPARISON:  Abdominal ultrasound 06/30/2013. FINDINGS: Gallbladder: A sludge ball is seen within the gallbladder measuring 2 cm in diameter. No stones, wall thickening or pericholecystic fluid identified. Stock reports negative Murphy's sign. Common bile duct: Diameter: 0.3 cm. Liver: No focal lesion identified. Within normal limits in parenchymal echogenicity. IMPRESSION: 2 cm sludge ball within the gallbladder. Negative for stones or cholecystitis. Electronically Signed   By: Inge Rise M.D.   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 She states she received ~3 hours of dialysis (normal session is 4 hours), when she started to vomit clear liquid with bright red streaks. She denies frank hematemesis or hemoptysis. She only takes on Advil maybe once a month. It has not occurred since. She has intermittent diarrhea, which may float. She endorses adherence to Creon. She denies hematochezia or melena. She is currently without complaint. She is normally nauseated and vomited last week, but without blood. She has a h/o chronic alcohol abuse without evidence of gastric or esophageal varices. She had an endoscopy in 2015 following an episode of hematemesis, which only demonstrated a gastric AVM. Colonoscopy at that time showed hemorrhoids. EGD in 2014 showed a Mallory-Weiss tear.   She states she is SOB at all times 2/2 her heart failure. She has stable 3 pillow orthopnea, but denies weight gain or LE edema. She quit smoking 20 years ago.   She has a long h/o alcohol use. Her last drink was last night, reported as 4 oz of Gin. She  drinks 2-3 nights/week and on weekends.   In the ED, her Hgb was stable at 12.0 with MCV 106. Lactate was elevated at 8.23. She had a single reported fever of 38.9C, but patient denied fever. She reports longstanding chills, but denies rigors. She was started on Vanc/CTX in the ED.  Hospital Course by problem list: Principal Problem:   Hematemesis Active Problems:   Alcohol abuse   Seizure disorder (HCC)   Severe protein-calorie malnutrition (HCC)   Hypoalbuminemia   FSGS (focal segmental glomerulosclerosis)   Anemia of chronic disease   Lupus (systemic lupus erythematosus) (HCC)   Diarrhea   ESRD (end stage renal disease) on dialysis (Ong)  Lactic acidosis   Essential hypertension   Fever, unspecified   GIB (gastrointestinal bleeding)   Hematemesis: Patient did not have repeat episode of blood streaked emesis during admission.  GI consult felt etiology was most likely due to self-resolved Mallory-Weiss tear in the setting of continued alcohol use and further workup was unnecessary. Patient's hemoglobin remained stable on day of discharge.    Clostridium Difficile Colitis: Patient had consistently elevated temperatures reaching >39C and elevated lactate. Her normalized with IVF.  She received Vanc and CTX in the ED.  Her C diff PCR was positive and she was transitioned to PO Vancomycin for 7 days treatment.    ESBL E coli Bacteruria: Patient was asymptomatic, but her urine culture grew ESBL E coli, sensitive only to Bactrim, Carbapenems, and Zosyn.  Patient was given one dose of Fosfomycin while inpatient.  She should then receive a second dose after 72 hours, to be taken on March 2.  Fosfomycin sensitivities were requested.  If patient fails treatment with Fosfomycin, she will need a PICC line placement for administration of Carbapenem.  Discharge Vitals:   BP 119/76 mmHg  Pulse 94  Temp(Src) 97.9 F (36.6 C) (Oral)  Resp 18  Wt 86 lb 3.2 oz (39.1 kg)  SpO2 100%  Discharge  Labs:  Results for orders placed or performed during the hospital encounter of 07/30/15 (from the past 24 hour(s))  Lactic acid, plasma     Status: Abnormal   Collection Time: 08/01/15  3:30 PM  Result Value Ref Range   Lactic Acid, Venous 4.0 (HH) 0.5 - 2.0 mmol/L  Lactic acid, plasma     Status: None   Collection Time: 08/02/15 12:34 AM  Result Value Ref Range   Lactic Acid, Venous 1.9 0.5 - 2.0 mmol/L  CBC     Status: Abnormal   Collection Time: 08/02/15  4:33 AM  Result Value Ref Range   WBC 6.2 4.0 - 10.5 K/uL   RBC 2.67 (L) 3.87 - 5.11 MIL/uL   Hemoglobin 8.8 (L) 12.0 - 15.0 g/dL   HCT 27.0 (L) 36.0 - 46.0 %   MCV 101.1 (H) 78.0 - 100.0 fL   MCH 33.0 26.0 - 34.0 pg   MCHC 32.6 30.0 - 36.0 g/dL   RDW 19.1 (H) 11.5 - 15.5 %   Platelets 39 (L) 150 - 400 K/uL  Renal function panel     Status: Abnormal   Collection Time: 08/02/15  4:33 AM  Result Value Ref Range   Sodium 136 135 - 145 mmol/L   Potassium 4.1 3.5 - 5.1 mmol/L   Chloride 110 101 - 111 mmol/L   CO2 16 (L) 22 - 32 mmol/L   Glucose, Bld 80 65 - 99 mg/dL   BUN 13 6 - 20 mg/dL   Creatinine, Ser 3.98 (H) 0.44 - 1.00 mg/dL   Calcium 7.2 (L) 8.9 - 10.3 mg/dL   Phosphorus 1.7 (L) 2.5 - 4.6 mg/dL   Albumin 1.2 (L) 3.5 - 5.0 g/dL   GFR calc non Af Amer 11 (L) >60 mL/min   GFR calc Af Amer 13 (L) >60 mL/min   Anion gap 10 5 - 15    Signed: Iline Oven, MD 08/02/2015, 2:36 PM    Services Ordered on Discharge:  Equipment Ordered on Discharge:

## 2015-07-31 NOTE — Progress Notes (Signed)
Pharmacy Antibiotic Note  [name] [name] is a 60 y.o. female admitted on 07/30/2015 with UTI.  Pharmacy has been consulted for Rocephin dosing.  Plan: Rocephin 1g IV Q24H.  Weight: 87 lb 15.4 oz (39.9 kg)  Temp (24hrs), Avg:100.9 F (38.3 C), Min:97.9 F (36.6 C), Max:102.7 F (39.3 C)   Recent Labs Lab 07/30/15 0955 07/30/15 1004 07/30/15 1118 07/30/15 1357 07/30/15 2029 07/30/15 2323 07/31/15 0538  WBC 9.2  --   --   --   --   --  8.0  CREATININE 1.81* 1.70*  --   --   --   --  2.88*  LATICACIDVEN  --   --  8.23* 8.36* 7.0* 7.1* 3.7*    Estimated Creatinine Clearance: 13.4 mL/min (by C-G formula based on Cr of 2.88).    Allergies  Allergen Reactions  . Amitriptyline Hcl Swelling    In the face.  . Doxycycline Hyclate Itching    Feels like something crawling under her skin      Thank you for allowing pharmacy to be a part of this patient's care.  Wynona Neat, PharmD, BCPS  07/31/2015 7:56 AM

## 2015-07-31 NOTE — Progress Notes (Signed)
[facility] Cross-covering for Mann/Hung   Subjective:  Says that she feels ok.  No further vomiting.  Had two light brown stools per patient report.  No diarrhea and no abdominal pain.  Ate small amount of breakfast.  Objective:  Vital signs in last 24 hours: Temp:  [97.9 F (36.6 C)-102.7 F (39.3 C)] 97.9 F (36.6 C) (02/25 0658) Pulse Rate:  [102-135] 108 (02/25 0658) Resp:  [16-23] 20 (02/25 0658) BP: (77-149)/(46-93) 77/46 mmHg (02/25 0658) SpO2:  [93 %-100 %] 100 % (02/25 0658) Weight:  [87 lb 15.4 oz (39.9 kg)] 87 lb 15.4 oz (39.9 kg) (02/25 0658) Last BM Date: 07/30/15 General:  Alert, thin, in NAD Heart:  Slightly tachy.  No murmurs noted. Pulm:  CTAB.  No W/R/R. Abdomen:  Soft, non-distended. Normal bowel sounds.  Non-tender. Rectal:  External hemorrhoids noted.  Light tan stool on exam glove with DRE. Extremities:  Without edema. Neurologic:  Alert and oriented x 4;  grossly normal neurologically. Psych:  Alert and cooperative. Normal mood and affect.  Intake/Output from previous day: 02/24 0701 - 02/25 0700 In: 1300 [P.O.:200; I.V.:1100] Out: -   Lab Results:  Recent Labs  07/30/15 0955 07/30/15 1004 07/31/15 0538  WBC 9.2  --  8.0  HGB 12.0 14.6 8.9*  HCT 36.7 43.0 27.8*  PLT 60*  --  52*   BMET  Recent Labs  07/30/15 0955 07/30/15 1004 07/31/15 0538  NA 137 139 137  K 4.7 4.7 3.5  CL 100* 98* 108  CO2 21*  --  19*  GLUCOSE 117* 112* 144*  BUN <5* 4* 5*  CREATININE 1.81* 1.70* 2.88*  CALCIUM 7.4*  --  6.6*   LFT  Recent Labs  07/30/15 1349 07/31/15 0538  PROT 6.8 6.0*  ALBUMIN 1.4* 1.2*  AST 44* 74*  ALT 9* 9*  ALKPHOS 386* 389*  BILITOT 0.6 1.4*  BILIDIR 0.3  --   IBILI 0.3  --    PT/INR  Recent Labs  07/30/15 1349 07/31/15 0538  LABPROT 23.5* 27.0*  INR 2.12* 2.54*   Ct Abdomen Pelvis Wo Contrast  07/30/2015  CLINICAL DATA:  Room and was provided with item leaking no go with Nausea and vomiting during  dialysis today. Sharp generalized abdominal pain during that time. EXAM: CT ABDOMEN AND PELVIS WITHOUT CONTRAST TECHNIQUE: Multidetector CT imaging of the abdomen and pelvis was performed following the standard protocol without IV contrast. COMPARISON:  No comparison studies available. FINDINGS: Lower chest:  Unremarkable Hepatobiliary: Liver is 19 cm in craniocaudal length. Subtle nodularity of the hepatic contour raises the question of cirrhosis. Layering tiny stones are noted in the lumen of the gallbladder. No intrahepatic or extrahepatic biliary dilation. Pancreas: Numerous coarse dystrophic calcifications are seen throughout the pancreas with dilatation of the main pancreatic duct. Pancreatic parenchyma is diffusely atrophic. Spleen: No splenomegaly. No focal mass lesion. Adrenals/Urinary Tract: No adrenal nodule or mass. Kidneys are unremarkable. No hydronephrosis. No evidence for hydroureter. The urinary bladder appears normal for the degree of distention. Stomach/Bowel: Stomach is nondistended. No gastric wall thickening. No evidence of outlet obstruction. Duodenum is normally positioned as is the ligament of Treitz. No small bowel wall thickening. No small bowel dilatation. Patient is status post right hemicolectomy. Remaining segments of the colon show mild circumferential wall thickening and overall the colon appears featureless. There is no substantial pericolonic edema or inflammation. Vascular/Lymphatic: No abdominal aortic aneurysm. No abdominal atherosclerotic calcification. There is no gastrohepatic or hepatoduodenal ligament lymphadenopathy. No  intraperitoneal or retroperitoneal lymphadenopathy. Multiple small lymph nodes are seen in the retroperitoneal space. No pelvic sidewall lymphadenopathy. Scattered small lymph nodes are seen in the inguinal regions bilaterally. Reproductive: The uterus has normal CT imaging appearance. There is no adnexal mass. Other: No intraperitoneal free fluid.  Musculoskeletal: Bone windows reveal no worrisome lytic or sclerotic osseous lesions. IMPRESSION: 1. Featureless colon with suggestion of mild diffuse circumferential wall thickening. Underlying infectious/inflammatory colitis would be a consideration. 2. Mild hepatomegaly. Liver contour is nodular raising concern for cirrhosis. 3. Dystrophic calcifications seen throughout the pancreas with dilatation of the main pancreatic duct. No discrete mass lesion is seen in the head of the pancreas on this study without IV contrast serial. Features may be related to chronic pain is. MRI without and with contrast could be used to further evaluate and exclude pancreatic head lesion as etiology for the dilatation of the main pancreatic duct. 4. Cholelithiasis. Electronically Signed   By: Misty Stanley M.D.   On: 07/30/2015 14:58   Dg Chest 2 View  07/30/2015  CLINICAL DATA:  Dizziness, fell down, nausea, dialysis patient, elevated lactate, hypertension EXAM: CHEST  2 VIEW COMPARISON:  07/01/2015 FINDINGS: Enlargement of cardiac silhouette. Mediastinal contours and pulmonary vascularity normal. Lungs emphysematous but clear. No infiltrate, pleural effusion or pneumothorax. Diffuse osseous demineralization. IMPRESSION: Emphysematous changes without acute infiltrate. Electronically Signed   By: Lavonia Dana M.D.   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  Presented with hematemesis (blood streaks).  Has no history of varices on EGD 2014 and 2015.  Hgb down a couple of grams this AM but she's had no further overt bleeding.  She also has coagulopathy and INR seems to be creeping up.   *Non-contrast CT abnormalities of the colon:  Patient denies pain, diarrhea, and there is no rectal bleeding.  Last colon 08/3013 ok. -Fever:  Unclear source.  No leukocytosis, but lactic acid was high.  It is trending down, however, and she does not  have any abdominal complaints.  Has UTI but is on Rocephin for that.  Is on vancomycin currently as well. -History of chronic pancreatitis:  On pancreatic enzymes.  May need outpatient MRI due to findings on recent CT scan. *ESRD on HD MWF  -Close monitoring of Hgb, PT/INR, platelets, LFT's. -Continue pantoprazole 40 mg BID. -No plans for procedure currently unless shows sign of bleeding.   LOS: 1 day   Shakayla Hickox D.  07/31/2015, 8:34 AM  Pager number [phone]

## 2015-08-01 DIAGNOSIS — N186 End stage renal disease: Secondary | ICD-10-CM | POA: Diagnosis not present

## 2015-08-01 DIAGNOSIS — N390 Urinary tract infection, site not specified: Secondary | ICD-10-CM | POA: Diagnosis not present

## 2015-08-01 DIAGNOSIS — K920 Hematemesis: Secondary | ICD-10-CM | POA: Diagnosis not present

## 2015-08-01 DIAGNOSIS — A047 Enterocolitis due to Clostridium difficile: Secondary | ICD-10-CM | POA: Diagnosis not present

## 2015-08-01 DIAGNOSIS — I120 Hypertensive chronic kidney disease with stage 5 chronic kidney disease or end stage renal disease: Secondary | ICD-10-CM | POA: Diagnosis not present

## 2015-08-01 DIAGNOSIS — D631 Anemia in chronic kidney disease: Secondary | ICD-10-CM | POA: Diagnosis not present

## 2015-08-01 DIAGNOSIS — Z992 Dependence on renal dialysis: Secondary | ICD-10-CM | POA: Diagnosis not present

## 2015-08-01 LAB — CBC
HEMATOCRIT: 27.1 % — AB (ref 36.0–46.0)
HEMOGLOBIN: 8.7 g/dL — AB (ref 12.0–15.0)
MCH: 32.7 pg (ref 26.0–34.0)
MCHC: 32.1 g/dL (ref 30.0–36.0)
MCV: 101.9 fL — AB (ref 78.0–100.0)
Platelets: 43 10*3/uL — ABNORMAL LOW (ref 150–400)
RBC: 2.66 MIL/uL — ABNORMAL LOW (ref 3.87–5.11)
RDW: 19 % — ABNORMAL HIGH (ref 11.5–15.5)
WBC: 9.5 10*3/uL (ref 4.0–10.5)

## 2015-08-01 LAB — BASIC METABOLIC PANEL
ANION GAP: 8 (ref 5–15)
BUN: 9 mg/dL (ref 6–20)
CHLORIDE: 110 mmol/L (ref 101–111)
CO2: 19 mmol/L — AB (ref 22–32)
Calcium: 7.1 mg/dL — ABNORMAL LOW (ref 8.9–10.3)
Creatinine, Ser: 3.73 mg/dL — ABNORMAL HIGH (ref 0.44–1.00)
GFR calc non Af Amer: 12 mL/min — ABNORMAL LOW (ref >60)
GFR, EST AFRICAN AMERICAN: 14 mL/min — AB (ref >60)
Glucose, Bld: 136 mg/dL — ABNORMAL HIGH (ref 65–99)
Potassium: 3.8 mmol/L (ref 3.5–5.1)
Sodium: 137 mmol/L (ref 135–145)

## 2015-08-01 LAB — LACTIC ACID, PLASMA
LACTIC ACID, VENOUS: 3.2 mmol/L — AB (ref 0.5–2.0)
LACTIC ACID, VENOUS: 3.3 mmol/L — AB (ref 0.5–2.0)
LACTIC ACID, VENOUS: 4 mmol/L — AB (ref 0.5–2.0)
Lactic Acid, Venous: 2.3 mmol/L (ref 0.5–2.0)

## 2015-08-01 LAB — HIV ANTIBODY (ROUTINE TESTING W REFLEX): HIV SCREEN 4TH GENERATION: NONREACTIVE

## 2015-08-01 MED ORDER — SODIUM CHLORIDE 0.9 % IV BOLUS (SEPSIS)
250.0000 mL | Freq: Once | INTRAVENOUS | Status: AC
  Administered 2015-08-01: 250 mL via INTRAVENOUS

## 2015-08-01 MED ORDER — DARBEPOETIN ALFA 150 MCG/0.3ML IJ SOSY
150.0000 ug | PREFILLED_SYRINGE | INTRAMUSCULAR | Status: DC
Start: 2015-08-04 — End: 2015-08-02

## 2015-08-01 MED ORDER — SODIUM CHLORIDE 0.9 % IV SOLN: INTRAVENOUS | Status: DC

## 2015-08-01 MED ORDER — SODIUM CHLORIDE 0.9 % IV SOLN
INTRAVENOUS | Status: AC
  Administered 2015-08-01: 1000 mL via INTRAVENOUS

## 2015-08-01 MED ORDER — CALCITRIOL 0.5 MCG PO CAPS
1.2500 ug | ORAL_CAPSULE | ORAL | Status: DC
  Administered 2015-08-02: 1.25 ug via ORAL

## 2015-08-01 NOTE — Progress Notes (Signed)
CRITICAL VALUE ALERT  Critical value received:  Lactic acid 3.3  Date of notification:  08/01/2015  Time of notification:  [id number]  Critical value read back:Yes.    Nurse who received alert:  Hubert Azure  MD notified (1st page):  Dr. Isidor Holts  Time of first page:  [phone]  MD notified (2nd page):  Time of second page:  Responding MD:  Dr. Posey Pronto  Time MD responded:  [place], Harlow Mares

## 2015-08-01 NOTE — Progress Notes (Signed)
Progress Note   Subjective  Patient has had a fever a day ago and diarrhea yesterday, stools tested positive for C diff and she is on oral vancomycin. There is a report of a small amount of bright red blood in her stool yesterday but patient denies this. She states she has passed only brown stool she is aware of. Tolerating liquid diet and wants to eat regular diet.   Objective   Vital signs in last 24 hours: Temp:  [98.6 F (37 C)-98.8 F (37.1 C)] 98.6 F (37 C) (02/26 0500) Pulse Rate:  [92-99] 93 (02/26 0500) Resp:  [20-21] 21 (02/26 0500) BP: (86-104)/(57-68) 104/68 mmHg (02/26 0500) SpO2:  [100 %] 100 % (02/26 0500) Weight:  [84 lb 4.8 oz (38.238 kg)] 84 lb 4.8 oz (38.238 kg) (02/26 0500) Last BM Date: 07/31/15 General:    AA female in NAD Heart:  Regular rate and rhythm;  Lungs: Respirations even and unlabored, lungs CTA bilaterally Abdomen:  Soft, nontender and nondistended. Normal bowel sounds. Extremities:  Without edema. Neurologic:  Alert and oriented,  grossly normal neurologically. Psych:  Cooperative. Normal mood and affect.  Intake/Output from previous day: 02/25 0701 - 02/26 0700 In: 960 [P.O.:960] Out: 0  Intake/Output this shift:    Lab Results:  Recent Labs  07/31/15 0538 07/31/15 1430 08/01/15 0643  WBC 8.0 14.4* 9.5  HGB 8.9* 8.3* 8.7*  HCT 27.8* 25.0* 27.1*  PLT 52* 42* 43*   BMET  Recent Labs  07/30/15 0955 07/30/15 1004 07/31/15 0538 08/01/15 0643  NA 137 139 137 137  K 4.7 4.7 3.5 3.8  CL 100* 98* 108 110  CO2 21*  --  19* 19*  GLUCOSE 117* 112* 144* 136*  BUN <5* 4* 5* 9  CREATININE 1.81* 1.70* 2.88* 3.73*  CALCIUM 7.4*  --  6.6* 7.1*   LFT  Recent Labs  07/30/15 1349 07/31/15 0538  PROT 6.8 6.0*  ALBUMIN 1.4* 1.2*  AST 44* 74*  ALT 9* 9*  ALKPHOS 386* 389*  BILITOT 0.6 1.4*  BILIDIR 0.3  --   IBILI 0.3  --    PT/INR  Recent Labs  07/30/15 1349 07/31/15 0538  LABPROT 23.5* 27.0*  INR 2.12* 2.54*     Studies/Results: Ct Abdomen Pelvis Wo Contrast  07/30/2015  CLINICAL DATA:  Room and was provided with item leaking no go with Nausea and vomiting during dialysis today. Sharp generalized abdominal pain during that time. EXAM: CT ABDOMEN AND PELVIS WITHOUT CONTRAST TECHNIQUE: Multidetector CT imaging of the abdomen and pelvis was performed following the standard protocol without IV contrast. COMPARISON:  No comparison studies available. FINDINGS: Lower chest:  Unremarkable Hepatobiliary: Liver is 19 cm in craniocaudal length. Subtle nodularity of the hepatic contour raises the question of cirrhosis. Layering tiny stones are noted in the lumen of the gallbladder. No intrahepatic or extrahepatic biliary dilation. Pancreas: Numerous coarse dystrophic calcifications are seen throughout the pancreas with dilatation of the main pancreatic duct. Pancreatic parenchyma is diffusely atrophic. Spleen: No splenomegaly. No focal mass lesion. Adrenals/Urinary Tract: No adrenal nodule or mass. Kidneys are unremarkable. No hydronephrosis. No evidence for hydroureter. The urinary bladder appears normal for the degree of distention. Stomach/Bowel: Stomach is nondistended. No gastric wall thickening. No evidence of outlet obstruction. Duodenum is normally positioned as is the ligament of Treitz. No small bowel wall thickening. No small bowel dilatation. Patient is status post right hemicolectomy. Remaining segments of the colon show mild circumferential wall thickening  and overall the colon appears featureless. There is no substantial pericolonic edema or inflammation. Vascular/Lymphatic: No abdominal aortic aneurysm. No abdominal atherosclerotic calcification. There is no gastrohepatic or hepatoduodenal ligament lymphadenopathy. No intraperitoneal or retroperitoneal lymphadenopathy. Multiple small lymph nodes are seen in the retroperitoneal space. No pelvic sidewall lymphadenopathy. Scattered small lymph nodes are seen in the  inguinal regions bilaterally. Reproductive: The uterus has normal CT imaging appearance. There is no adnexal mass. Other: No intraperitoneal free fluid. Musculoskeletal: Bone windows reveal no worrisome lytic or sclerotic osseous lesions. IMPRESSION: 1. Featureless colon with suggestion of mild diffuse circumferential wall thickening. Underlying infectious/inflammatory colitis would be a consideration. 2. Mild hepatomegaly. Liver contour is nodular raising concern for cirrhosis. 3. Dystrophic calcifications seen throughout the pancreas with dilatation of the main pancreatic duct. No discrete mass lesion is seen in the head of the pancreas on this study without IV contrast serial. Features may be related to chronic pain is. MRI without and with contrast could be used to further evaluate and exclude pancreatic head lesion as etiology for the dilatation of the main pancreatic duct. 4. Cholelithiasis. Electronically Signed   By: Misty Stanley M.D.   On: 07/30/2015 14:58   Dg Chest 2 View  07/30/2015  CLINICAL DATA:  Dizziness, fell down, nausea, dialysis patient, elevated lactate, hypertension EXAM: CHEST  2 VIEW COMPARISON:  07/01/2015 FINDINGS: Enlargement of cardiac silhouette. Mediastinal contours and pulmonary vascularity normal. Lungs emphysematous but clear. No infiltrate, pleural effusion or pneumothorax. Diffuse osseous demineralization. IMPRESSION: Emphysematous changes without acute infiltrate. Electronically Signed   By: Lavonia Dana M.D.   On: 07/30/2015 12:50       Assessment / Plan:   60 y/o female with history of ESRD on HD and EtOH use, suspected cirrhosis with thrombocytopenia, who initially presented with vomiting emesis with a small amount of bright red blood. She has no history of esophageal varices on prior EGDs but has a history of Mallory weiss tears which I suspect was the likely etiology for her initial presentation. She has had no further bleeding and Hgb has been stable, I don't think  she warrants an EGD at this point as she is not actively bleeding and she wishes to advance her diet today to solids which is okay.  She otherwise had some inflammatory changes in her colon noted on her CT scan on admission. She has had some diarrhea and C Diff testing is positive. Agree with oral vancomycin for this. There is report she passed a small amount of bright red blood in her stool x 1 in the setting of diarrhea. She has internal and external hemorrhoids likely accounting for this and does not seem significant, would monitor for now.  Otherwise with history of chronic pancreatitis with calcifications and dilation of the main PD noted on CT. No mass lesions noted but study was without IV contrast. I would consider MRCP as outpatient once through her acute issues.   Dr. Benson Norway will be back tomorrow to resume her GI care as needed. Please call with questions / concerns.   Charenton Cellar, MD [facility] Pager [phone]        LOS: 2 days   Renelda Loma Armbruster  08/01/2015, 8:09 AM

## 2015-08-01 NOTE — Consult Note (Addendum)
[name] [name] Renal Consultation Note    Indication for Consultation:  Management of ESRD/hemodialysis; anemia, hypertension/volume and secondary hyperparathyroidism  HPI: [name] [name] is a 60 y.o. female with ESRD, SLE, severe PCM, alcohol abuse on MWF dialysis at the [facility] who started vomiting blood about 2 hours into here hemodialysis treatment on Friday with associated abdominal pain and was sent emergently to the ED for evaluation where she was found. She has episodes of nausea and vomiting but without blood.  Her last endoscopy was 2015 following an episode of hematemesis which showed a gastric AVM.  Colonoscopy at that time showed hemorrhoids.  EGD in 2014 showed a Mallory-Weiss tear. She admits to regular alcohol use and had about 4 oz gin the previous night  She only takes advil about once a month.  She has chronic SOB and chronic abdominal pain.  Admission Hgb was 12 K ws 4.7 BUN < 5 Cr 1.81 alkphos 386 Alb 1.4 AST 44 ALT 9 T Bili 0.6 lactic acid 8.23 WBC 9.2 plts 60. Repeat labs 2/25 showed K 3.5 BUN 5 AST 74 T bili 1.4 lactic acid trending down to 4 Hgb down to 8.9 plts 52 WBC 8. Today Hgb is stable at 8.7 plts dropping further to 43 K 3.8 BUN 9 C diff is positive.  Since admission she continues to have diarrhea, but has not had any hematemsis or dysuria or urgency. SOB is baseline. She walks with a cane. She takes her Creon as Rx and has been taking 3 tums ac  Past Medical History  Diagnosis Date  . Anemia, B12 deficiency   . History of acute pancreatitis   . Right knee pain     No recent imaging on chart  . 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 Dr. Mancel Bale, [facility]  . Hypertriglyceridemia   . GERD (gastroesophageal reflux disease)   . Subdural hematoma ([place]) 02/2008    Likely 2/2 trauma from seizure from EtOH withdrawal, chronic in nature, sees Dr. Jerene Bears. Most recent  CT head 10/2009 showing stable but persistent hematoma without mass effect.  . History of seizure disorder     Likely 2/2 alcohol abuse  . Hypocalcemia   . Hypomagnesemia   . Failure to thrive in childhood     Unclear etiology  . HTN (hypertension)   . Thrombocytopenia ([place])   . Hepatomegaly     On exam  . Joint pain   . Alcohol abuse   . Vitamin D deficiency   . Pancreatitis   . Insomnia   . Hyperlipidemia   . Pernicious anemia   . Macrocytic anemia   . 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 Depression   . Fx humeral neck 04/17/2011    Transverse fracture- minimally displaced- managed as outpatient   . ABNORMAL PAP SMEAR, LGSIL 07/23/2008    Annotation: HPV positive CIN I Dr. Mancel Bale, [facility] Qualifier: Diagnosis of  By: Oretha Ellis    . Pneumonia 05/20/2012  . Arthritis     "shoulders" (08/15/2013)  . 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  Marland Kitchen Chronic diastolic CHF (congestive heart failure) ([place])   . Hypomagnesemia   . Seizures ([place])     "don't know when/why I had them; daughter was always there w/me"  . On home oxygen therapy     "  2L; 24/7" (07/31/2015)  . Shortness of breath dyspnea   . Pleural effusion   . Renal insufficiency    Past Surgical History  Procedure Laterality Date  . Cesarean section  1983  . Esophagogastroduodenoscopy  07/11/2011    Procedure: ESOPHAGOGASTRODUODENOSCOPY (EGD);  Surgeon: Beryle Beams, MD;  Location[facility];  Service: Endoscopy;  Laterality: N/A;  . Colonoscopy  07/11/2011    Procedure: COLONOSCOPY;  Surgeon: Beryle Beams, MD;  Location: [facility] ENDOSCOPY;  Service: Endoscopy;  Laterality: N/A;  . Eye surgery Left     "trauma"  . Right colectomy  08/28/2011  . Esophagogastroduodenoscopy N/A 12/01/2012    Procedure: ESOPHAGOGASTRODUODENOSCOPY (EGD);  Surgeon: Irene Shipper, MD;  Location: [facility];  Service: Endoscopy;  Laterality: N/A;  . Colonoscopy with  esophagogastroduodenoscopy (egd) Left 08/21/2013    Procedure: COLONOSCOPY WITH ESOPHAGOGASTRODUODENOSCOPY (EGD);  Surgeon: Beryle Beams, MD;  Location: [facility] ENDOSCOPY;  Service: Endoscopy;  Laterality: Left;  . Subxyphoid pericardial window N/A 12/04/2013    Procedure: SUBXYPHOID PERICARDIAL WINDOW WITH TEE;  Surgeon: Ivin Poot, MD;  Location: [place];  Service: Thoracic;  Laterality: N/A;  . Intraoperative transesophageal echocardiogram N/A 12/04/2013    Procedure: INTRAOPERATIVE TRANSESOPHAGEAL ECHOCARDIOGRAM;  Surgeon: Ivin Poot, MD;  Location: [place];  Service: Open Heart Surgery;  Laterality: N/A;  . Exchange of a dialysis catheter Left 10/22/2014    Procedure: EXCHANGE OF A DIALYSIS CATHETER;  Surgeon: Conrad Springlake, MD;  Location: [place];  Service: Vascular;  Laterality: Left;  . Av fistula placement Left 12/03/2014    Procedure: INSERTION OF ARTERIOVENOUS (AV) GORE-TEX GRAFT ARM;  Surgeon: Mal Misty, MD;  Location[facility];  Service: Vascular;  Laterality: Left;   Family History  Problem Relation Age of Onset  . Cancer Mother     Died from stomach cancer and "flesh eating rash  . Heart failure Father     Died in 38s from an MI  . Alcohol abuse Sister     Twin sister drinks a lot, as did both her parents and brothers  . Stroke Brother     Has 7 brothers, 1 with CVA  . Lupus Mother    Social History:  reports that she quit smoking about 4 years ago. Her smoking use included Cigarettes. She has a 20 pack-year smoking history. She has never used smokeless tobacco. She reports that she drinks alcohol. She reports that she uses illicit drugs (Marijuana and Cocaine). Allergies  Allergen Reactions  . Amitriptyline Hcl Swelling    In the face.  . Doxycycline Hyclate Itching    Feels like something crawling under her skin   Prior to Admission medications   Medication Sig Start Date End Date Taking? Authorizing Provider  albuterol (PROAIR HFA) 108 (90 BASE) MCG/ACT inhaler Inhale  1-2 puffs into the lungs every 6 (six) hours as needed for wheezing or shortness of breath. 01/14/14  Yes Jones Bales, MD  azaTHIOprine (IMURAN) 50 MG tablet Take 50 mg by mouth daily. 06/29/15  Yes Historical Provider, MD  calcium carbonate (TUMS - DOSED IN MG ELEMENTAL CALCIUM) 500 MG chewable tablet Chew 1,000 tablets by mouth at bedtime.   Yes Historical Provider, MD  cetirizine (ZYRTEC) 10 MG tablet Take 1 tablet (10 mg total) by mouth daily as needed for allergies. 06/11/15  Yes Shela Leff, MD  CREON 12000 UNITS CPEP capsule Take 1 capsule (12,000 Units total) by mouth 3 (three) times daily before meals. 01/21/15  Yes  Shela Leff, MD  FLUoxetine (PROZAC) 10 MG capsule Take 1 capsule (10 mg total) by mouth daily. For depression 01/21/15  Yes Shela Leff, MD  folic acid (FOLVITE) 1 MG tablet Take 1 tablet (1 mg total) by mouth daily. For folic acid replacement 06/08/15  Yes Shela Leff, MD  gabapentin (NEURONTIN) 300 MG capsule Take 1 capsule (300 mg total) by mouth daily. 04/09/15 04/06/16 Yes Shela Leff, MD  hydroxychloroquine (PLAQUENIL) 200 MG tablet Take 400 mg by mouth daily.   Yes Historical Provider, MD  Lactobacillus (ACIDOPHILUS PROBIOTIC PO) Take 2 mg by mouth 3 (three) times daily. Take 2 (two) tablets by mouth three times daily   Yes Historical Provider, MD  levETIRAcetam (KEPPRA) 250 MG tablet Take 1 tablet (250 mg total) by mouth daily. Take additional 500mg  (2 tablets) after Dialysis treatments on Monday, Wednesday, Friday 07/16/15  Yes Shary Decamp, PA-C  mirtazapine (REMERON) 7.5 MG tablet Take 1 tablet (7.5 mg total) by mouth at bedtime. 01/21/15  Yes Shela Leff, MD  Multiple Vitamin (MULTIVITAMIN WITH MINERALS) TABS tablet Take 1 tablet by mouth daily. For vitamin replacement 03/24/15  Yes Shela Leff, MD  omeprazole (PRILOSEC) 40 MG capsule Take 1 capsule (40 mg total) by mouth daily. 01/21/15  Yes Shela Leff, MD  thiamine (VITAMIN B-1)  100 MG tablet Take 1 tablet (100 mg total) by mouth daily. For low thiamine 01/21/15  Yes Shela Leff, MD  vitamin B-12 (CYANOCOBALAMIN) 250 MCG tablet Take 1 tablet (250 mcg total) by mouth every evening. 01/21/15  Yes Shela Leff, MD  camphor-menthol [facility]) lotion Apply 1 application topically daily as needed for itching.    Historical Provider, MD  loperamide (IMODIUM) 2 MG capsule Take 2 mg by mouth every 6 (six) hours as needed for diarrhea or loose stools.    Historical Provider, MD  nystatin (MYCOSTATIN) 100000 UNIT/ML suspension Take 5 mLs (500,000 Units total) by mouth 4 (four) times daily. 07/13/15   Ejiroghene Arlyce Dice, MD   Current Facility-Administered Medications  Medication Dose Route Frequency Provider Last Rate Last Dose  . 0.9 %  sodium chloride infusion   Intravenous Continuous Alexa Sherral Hammers, MD 75 mL/hr at 08/01/15 0914 1,000 mL at 08/01/15 0914  . acetaminophen (TYLENOL) tablet 650 mg  650 mg Oral Q8H PRN Alexa Sherral Hammers, MD      . azaTHIOprine (IMURAN) tablet 50 mg  50 mg Oral Daily Alexa Sherral Hammers, MD   50 mg at 08/01/15 0914  . calcium carbonate (TUMS - dosed in mg elemental calcium) chewable tablet 500 mg  500 mg Oral QHS Alexa Sherral Hammers, MD   500 mg at 07/31/15 2208  . FLUoxetine (PROZAC) capsule 10 mg  10 mg Oral Daily Alexa Sherral Hammers, MD   10 mg at 08/01/15 0914  . folic acid (FOLVITE) tablet 1 mg  1 mg Oral Daily Alexa Sherral Hammers, MD   1 mg at 08/01/15 0915  . gabapentin (NEURONTIN) capsule 300 mg  300 mg Oral Daily Alexa Sherral Hammers, MD   300 mg at 08/01/15 0915  . hydroxychloroquine (PLAQUENIL) tablet 400 mg  400 mg Oral Daily Alexa Sherral Hammers, MD   400 mg at 08/01/15 1000  . levETIRAcetam (KEPPRA) tablet 250 mg  250 mg Oral Daily Iline Oven, MD   250 mg at 08/01/15 0915   And  . levETIRAcetam (KEPPRA) tablet 500 mg  500 mg Oral Once per day on Mon Wed Fri Iline Oven, MD   500  mg at 07/30/15 2114  .  lipase/protease/amylase (CREON) capsule 12,000 Units  12,000 Units Oral TID AC Alexa Sherral Hammers, MD   12,000 Units at 08/01/15 1301  . LORazepam (ATIVAN) tablet 1 mg  1 mg Oral Q6H PRN Alexa Sherral Hammers, MD       Or  . LORazepam (ATIVAN) injection 1 mg  1 mg Intravenous Q6H PRN Alexa Sherral Hammers, MD      . mirtazapine (REMERON) tablet 7.5 mg  7.5 mg Oral QHS Alexa Sherral Hammers, MD   7.5 mg at 07/31/15 2209  . multivitamin with minerals tablet 1 tablet  1 tablet Oral Daily Alexa Sherral Hammers, MD   1 tablet at 08/01/15 [phone]  . ondansetron (ZOFRAN) tablet 4 mg  4 mg Oral Q6H PRN Alexa Sherral Hammers, MD       Or  . ondansetron (ZOFRAN) injection 4 mg  4 mg Intravenous Q6H PRN Alexa Sherral Hammers, MD      . pantoprazole (PROTONIX) injection 40 mg  40 mg Intravenous Q12H Alexa Sherral Hammers, MD   40 mg at 08/01/15 1000  . sodium chloride 0.9 % bolus 250 mL  250 mL Intravenous Once Zada Finders, MD      . sucralfate (CARAFATE) 1 GM/10ML suspension 1 g  1 g Oral TID WC & HS Iline Oven, MD   1 g at 08/01/15 0916  . thiamine (VITAMIN B-1) tablet 100 mg  100 mg Oral Daily Alexa Sherral Hammers, MD   100 mg at 08/01/15 [id number]   Or  . thiamine (B-1) injection 100 mg  100 mg Intravenous Daily Alexa Sherral Hammers, MD      . vancomycin (VANCOCIN) 50 mg/mL oral solution 125 mg  125 mg Oral QID Franky Macho, RPH   125 mg at 08/01/15 [id number]  . vitamin B-12 (CYANOCOBALAMIN) tablet 250 mcg  250 mcg Oral QPM Alexa Sherral Hammers, MD   250 mcg at 07/31/15 1800   Labs: Basic Metabolic Panel:  Recent Labs Lab 07/30/15 0955 07/30/15 1004 07/31/15 0538 08/01/15 0643  NA 137 139 137 137  K 4.7 4.7 3.5 3.8  CL 100* 98* 108 110  CO2 21*  --  19* 19*  GLUCOSE 117* 112* 144* 136*  BUN <5* 4* 5* 9  CREATININE 1.81* 1.70* 2.88* 3.73*  CALCIUM 7.4*  --  6.6* 7.1*   Liver Function Tests:  Recent Labs Lab 07/30/15 1349 07/31/15 0538  AST 44* 74*  ALT 9* 9*  ALKPHOS 386* 389*  BILITOT 0.6 1.4*  PROT 6.8 6.0*   ALBUMIN 1.4* 1.2*   No results for input(s): LIPASE, AMYLASE in the last 168 hours. No results for input(s): AMMONIA in the last 168 hours. CBC:  Recent Labs Lab 07/30/15 0955  07/31/15 0538 07/31/15 1430 08/01/15 0643  WBC 9.2  --  8.0 14.4* 9.5  NEUTROABS 7.6  --   --   --   --   HGB 12.0  < > 8.9* 8.3* 8.7*  HCT 36.7  < > 27.8* 25.0* 27.1*  MCV 105.8*  --  103.0* 104.6* 101.9*  PLT 60*  --  52* 42* 43*  < > = values in this interval not displayed.  ROS: As per HPI otherwise negative.  Physical Exam: Filed Vitals:   07/31/15 1721 07/31/15 2100 08/01/15 0500 08/01/15 0922  BP: 94/58 89/58 104/68 105/65  Pulse: 92 93 93   Temp:  98.8 F (37.1 C) 98.6 F (37 C)   TempSrc:  Resp:  20 21   Weight:   38.238 kg (84 lb 4.8 oz)   SpO2:  100% 100%      General:  Thin malnourished AAF Head: Normocephalic, atraumatic, sclera non-icteric, MMM face a little puffy Neck: Supple. JVD not elevated. Lungs: Clear bilaterally to auscultation without wheezes, rales, or rhonchi. Breathing is unlabored. Heart: RRR with S1 S2. No murmurs, rubs, or gallops appreciated. Abdomen: Soft, thin non-distended  Mild generalized tenderness + BS. No rebound/guarding .M-S:  Strength and tone diminished for age Lower extremities: tr LE edema; no ischemic changes, no open wounds  Neuro: Alert and oriented X 3. Moves all extremities spontaneously. Psych:  Responds to questions appropriately with a normal affect. Dialysis Access:left upper AVGG + bruit  Dialysis Orders:  [facility] MWF 4 hr 160 350/A 1.5 EDW 45.5 4 K 2.25 Ca no profile heparin 2000 Mircera 150 q 2 wks lat 2/15 calcitriol 1.25 left upper AVGG Recent labs:  Hgb 10.2 stable Fe 86 no tsat ferritin 1684 iPTH 298 Ca 8.6 P 1.1 Alb 2  Assessment/Plan: 1. Hemetemsis - on PPI and carafate (need to limit to one month due to Al content); conservative management - needs to avoid etoh and nsaids 2. C diff positive - on oral Vanc 3. UTI - Urine  culture > 100K colonies E Coli - asymptomatic' sensitivies pending - had 2 doses rocephin; also IV Vanc 4. ESRD -  MWF - next HD Monday --  5. Hypertension/volume  - low gains, gets to edw BP comes down 3kg over dry wt 6. Anemia/ABLA due to hemetemesis - hgb down to 8.7 but stable - continue ESA; trend Hgb - no fe 7. Metabolic bone disease -  continue calcitriol - no binders 8. Severe PCM - underweight; hx low P 1 2/22) and low Alb; - check P with pre HD labs - she only has one ordered ac here (outpt dose = 3ac) 9. SLE - usual meds 10. Thrombocytopenia - holding heparin with HD - follow 10. Depression - current meds- remeron and prozac 11. Alcohol abuse/probably cirrhosis - prn ativan for withdrawal symptoms 12. Seizure disorder -keppra 13. Hx pancreatitis - on [place], PA-C [place] [phone] 08/01/2015, 2:45 PM   Pt seen, examined, agree w assess/plan as above with additions as indicated. ESRD patient with hx SLSE and etoh abuse presenting with abd pain, n/v and diarrhea.   She is + for Cdif toxin/ Ag, on po vanc now.  Getting fluids but is 3kg over her dry weight, will decrease IVF's. HD Monday.  Kelly Splinter MD [employer] pager [phone]    cell [phone] 08/01/2015, 4:29 PM

## 2015-08-01 NOTE — Progress Notes (Signed)
Subjective:  Patient was seen and examined this morning. She continues to have loose BMs, but denies nausea, vomiting, abdominal pain, fevers, lightheadedness, dysuria or shortness of breath.   Objective: Vital signs in last 24 hours: Filed Vitals:   07/31/15 1721 07/31/15 2100 08/01/15 0500 08/01/15 0922  BP: 94/58 89/58 104/68 105/65  Pulse: 92 93 93   Temp:  98.8 F (37.1 C) 98.6 F (37 C)   TempSrc:      Resp:  20 21   Weight:   84 lb 4.8 oz (38.238 kg)   SpO2:  100% 100%    Weight change: -3 lb 10.6 oz (-1.662 kg)  Intake/Output Summary (Last 24 hours) at 08/01/15 1256 Last data filed at 08/01/15 0958  Gross per 24 hour  Intake    820 ml  Output      0 ml  Net    820 ml   General: Vital signs reviewed.  Patient is chronically ill appearing, in no acute distress and cooperative with exam.  Cardiovascular: Tachycardic, regular rhythm Pulmonary/Chest: Clear to auscultation bilaterally, no wheezes, rales, or rhonchi. Abdominal: Soft, non-tender, non-distended, BS +.  Extremities: LUE AVF with good thrill and bruit, no erythema or signs of infection. No lower extremity edema bilaterally.  Lab Results: Basic Metabolic Panel:  Recent Labs Lab 07/31/15 0538 08/01/15 0643  NA 137 137  K 3.5 3.8  CL 108 110  CO2 19* 19*  GLUCOSE 144* 136*  BUN 5* 9  CREATININE 2.88* 3.73*  CALCIUM 6.6* 7.1*   Liver Function Tests:  Recent Labs Lab 07/30/15 1349 07/31/15 0538  AST 44* 74*  ALT 9* 9*  ALKPHOS 386* 389*  BILITOT 0.6 1.4*  PROT 6.8 6.0*  ALBUMIN 1.4* 1.2*   CBC:  Recent Labs Lab 07/30/15 0955  07/31/15 1430 08/01/15 0643  WBC 9.2  < > 14.4* 9.5  NEUTROABS 7.6  --   --   --   HGB 12.0  < > 8.3* 8.7*  HCT 36.7  < > 25.0* 27.1*  MCV 105.8*  < > 104.6* 101.9*  PLT 60*  < > 42* 43*  < > = values in this interval not displayed.  Coagulation:  Recent Labs Lab 07/30/15 1349 07/31/15 0538  LABPROT 23.5* 27.0*  INR 2.12* 2.54*   Urine Drug  Screen: Drugs of Abuse     Component Value Date/Time   LABOPIA NONE DETECTED 07/16/2015 1400   LABOPIA NEG 09/18/2011 0936   COCAINSCRNUR NONE DETECTED 07/16/2015 1400   COCAINSCRNUR NEG 09/18/2011 0936   LABBENZ NONE DETECTED 07/16/2015 1400   LABBENZ NEG 09/18/2011 0936   LABBENZ NEG 04/10/2011 1130   AMPHETMU NONE DETECTED 07/16/2015 1400   AMPHETMU NEG 09/18/2011 0936   AMPHETMU NEG 04/10/2011 1130   THCU NONE DETECTED 07/16/2015 1400   THCU NEG 09/18/2011 0936   LABBARB NONE DETECTED 07/16/2015 1400   LABBARB NEG 09/18/2011 0936    Urinalysis:  Recent Labs Lab 07/30/15 1650  COLORURINE YELLOW  LABSPEC 1.009  PHURINE 7.0  GLUCOSEU NEGATIVE  HGBUR MODERATE*  BILIRUBINUR NEGATIVE  KETONESUR NEGATIVE  PROTEINUR 100*  NITRITE NEGATIVE  LEUKOCYTESUR MODERATE*   Micro Results: Recent Results (from the past 240 hour(s))  Blood culture (routine x 2)     Status: None (Preliminary result)   Collection Time: 07/30/15 12:00 PM  Result Value Ref Range Status   Specimen Description BLOOD RIGHT ARM  Final   Special Requests BOTTLES DRAWN AEROBIC AND ANAEROBIC 5MLS  Final  Culture NO GROWTH 1 DAY  Final   Report Status PENDING  Incomplete  Blood culture (routine x 2)     Status: None (Preliminary result)   Collection Time: 07/30/15 12:10 PM  Result Value Ref Range Status   Specimen Description BLOOD RIGHT HAND  Final   Special Requests BOTTLES DRAWN AEROBIC AND ANAEROBIC 5MLS  Final   Culture NO GROWTH 1 DAY  Final   Report Status PENDING  Incomplete  Culture, Urine     Status: None (Preliminary result)   Collection Time: 07/30/15  4:50 PM  Result Value Ref Range Status   Specimen Description URINE, CLEAN CATCH  Final   Special Requests NONE  Final   Culture >=100,000 COLONIES/mL ESCHERICHIA COLI  Final   Report Status PENDING  Incomplete  MRSA PCR Screening     Status: None   Collection Time: 07/30/15  9:29 PM  Result Value Ref Range Status   MRSA by PCR NEGATIVE  NEGATIVE Final    Comment:        The GeneXpert MRSA Assay (FDA approved for NASAL specimens only), is one component of a comprehensive MRSA colonization surveillance program. It is not intended to diagnose MRSA infection nor to guide or monitor treatment for MRSA infections.   C difficile quick scan w PCR reflex     Status: Abnormal   Collection Time: 07/31/15  5:50 PM  Result Value Ref Range Status   C Diff antigen POSITIVE (A) NEGATIVE Final   C Diff toxin POSITIVE (A) NEGATIVE Final   C Diff interpretation Positive for toxigenic C. difficile  Final    Comment: CRITICAL RESULT CALLED TO, READ BACK BY AND VERIFIED WITH: RN KOME,J AT 2016 MARTINB PP:[id number]    Studies/Results: Ct Abdomen Pelvis Wo Contrast  07/30/2015  CLINICAL DATA:  Room and was provided with item leaking no go with Nausea and vomiting during dialysis today. Sharp generalized abdominal pain during that time. EXAM: CT ABDOMEN AND PELVIS WITHOUT CONTRAST TECHNIQUE: Multidetector CT imaging of the abdomen and pelvis was performed following the standard protocol without IV contrast. COMPARISON:  No comparison studies available. FINDINGS: Lower chest:  Unremarkable Hepatobiliary: Liver is 19 cm in craniocaudal length. Subtle nodularity of the hepatic contour raises the question of cirrhosis. Layering tiny stones are noted in the lumen of the gallbladder. No intrahepatic or extrahepatic biliary dilation. Pancreas: Numerous coarse dystrophic calcifications are seen throughout the pancreas with dilatation of the main pancreatic duct. Pancreatic parenchyma is diffusely atrophic. Spleen: No splenomegaly. No focal mass lesion. Adrenals/Urinary Tract: No adrenal nodule or mass. Kidneys are unremarkable. No hydronephrosis. No evidence for hydroureter. The urinary bladder appears normal for the degree of distention. Stomach/Bowel: Stomach is nondistended. No gastric wall thickening. No evidence of outlet obstruction. Duodenum is normally  positioned as is the ligament of Treitz. No small bowel wall thickening. No small bowel dilatation. Patient is status post right hemicolectomy. Remaining segments of the colon show mild circumferential wall thickening and overall the colon appears featureless. There is no substantial pericolonic edema or inflammation. Vascular/Lymphatic: No abdominal aortic aneurysm. No abdominal atherosclerotic calcification. There is no gastrohepatic or hepatoduodenal ligament lymphadenopathy. No intraperitoneal or retroperitoneal lymphadenopathy. Multiple small lymph nodes are seen in the retroperitoneal space. No pelvic sidewall lymphadenopathy. Scattered small lymph nodes are seen in the inguinal regions bilaterally. Reproductive: The uterus has normal CT imaging appearance. There is no adnexal mass. Other: No intraperitoneal free fluid. Musculoskeletal: Bone windows reveal no worrisome lytic or sclerotic osseous lesions. IMPRESSION:  1. Featureless colon with suggestion of mild diffuse circumferential wall thickening. Underlying infectious/inflammatory colitis would be a consideration. 2. Mild hepatomegaly. Liver contour is nodular raising concern for cirrhosis. 3. Dystrophic calcifications seen throughout the pancreas with dilatation of the main pancreatic duct. No discrete mass lesion is seen in the head of the pancreas on this study without IV contrast serial. Features may be related to chronic pain is. MRI without and with contrast could be used to further evaluate and exclude pancreatic head lesion as etiology for the dilatation of the main pancreatic duct. 4. Cholelithiasis. Electronically Signed   By: Misty Stanley M.D.   On: 07/30/2015 14:58   Medications:  I have reviewed the patient's current medications. Prior to Admission:  Prescriptions prior to admission  Medication Sig Dispense Refill Last Dose  . albuterol (PROAIR HFA) 108 (90 BASE) MCG/ACT inhaler Inhale 1-2 puffs into the lungs every 6 (six) hours as  needed for wheezing or shortness of breath. 1 Inhaler 1 Past Month at Unknown time  . azaTHIOprine (IMURAN) 50 MG tablet Take 50 mg by mouth daily.   07/29/2015 at Unknown time  . calcium carbonate (TUMS - DOSED IN MG ELEMENTAL CALCIUM) 500 MG chewable tablet Chew 1,000 tablets by mouth at bedtime.   07/29/2015 at Unknown time  . cetirizine (ZYRTEC) 10 MG tablet Take 1 tablet (10 mg total) by mouth daily as needed for allergies. 30 tablet 3 Past Month at Unknown time  . CREON 12000 UNITS CPEP capsule Take 1 capsule (12,000 Units total) by mouth 3 (three) times daily before meals. 270 capsule 3 07/29/2015 at Unknown time  . FLUoxetine (PROZAC) 10 MG capsule Take 1 capsule (10 mg total) by mouth daily. For depression 90 capsule 3 07/29/2015 at Unknown time  . folic acid (FOLVITE) 1 MG tablet Take 1 tablet (1 mg total) by mouth daily. For folic acid replacement 30 tablet 6 07/29/2015 at Unknown time  . gabapentin (NEURONTIN) 300 MG capsule Take 1 capsule (300 mg total) by mouth daily. 90 capsule 3 07/29/2015 at Unknown time  . hydroxychloroquine (PLAQUENIL) 200 MG tablet Take 400 mg by mouth daily.   07/29/2015 at Unknown time  . Lactobacillus (ACIDOPHILUS PROBIOTIC PO) Take 2 mg by mouth 3 (three) times daily. Take 2 (two) tablets by mouth three times daily   07/29/2015 at Unknown time  . levETIRAcetam (KEPPRA) 250 MG tablet Take 1 tablet (250 mg total) by mouth daily. Take additional 500mg  (2 tablets) after Dialysis treatments on Monday, Wednesday, Friday 30 tablet 0 07/29/2015 at Unknown time  . mirtazapine (REMERON) 7.5 MG tablet Take 1 tablet (7.5 mg total) by mouth at bedtime. 90 tablet 3 07/29/2015 at Unknown time  . Multiple Vitamin (MULTIVITAMIN WITH MINERALS) TABS tablet Take 1 tablet by mouth daily. For vitamin replacement 90 tablet 3 07/29/2015 at Unknown time  . omeprazole (PRILOSEC) 40 MG capsule Take 1 capsule (40 mg total) by mouth daily. 90 capsule 3 07/29/2015 at Unknown time  . thiamine (VITAMIN  B-1) 100 MG tablet Take 1 tablet (100 mg total) by mouth daily. For low thiamine 90 tablet 3 07/29/2015 at Unknown time  . vitamin B-12 (CYANOCOBALAMIN) 250 MCG tablet Take 1 tablet (250 mcg total) by mouth every evening. 90 tablet 3 07/29/2015 at Unknown time  . camphor-menthol (SARNA) lotion Apply 1 application topically daily as needed for itching.   unknown at unknown  . loperamide (IMODIUM) 2 MG capsule Take 2 mg by mouth every 6 (six) hours as needed  for diarrhea or loose stools.   unknown at unknown  . nystatin (MYCOSTATIN) 100000 UNIT/ML suspension Take 5 mLs (500,000 Units total) by mouth 4 (four) times daily. 60 mL 0    Scheduled Meds: . azaTHIOprine  50 mg Oral Daily  . calcium carbonate  500 mg Oral QHS  . FLUoxetine  10 mg Oral Daily  . folic acid  1 mg Oral Daily  . gabapentin  300 mg Oral Daily  . hydroxychloroquine  400 mg Oral Daily  . levETIRAcetam  250 mg Oral Daily   And  . levETIRAcetam  500 mg Oral Once per day on Mon Wed Fri  . lipase/protease/amylase  12,000 Units Oral TID AC  . mirtazapine  7.5 mg Oral QHS  . multivitamin with minerals  1 tablet Oral Daily  . pantoprazole (PROTONIX) IV  40 mg Intravenous Q12H  . sucralfate  1 g Oral TID WC & HS  . thiamine  100 mg Oral Daily   Or  . thiamine  100 mg Intravenous Daily  . vancomycin  125 mg Oral QID  . vitamin B-12  250 mcg Oral QPM   Continuous Infusions: . sodium chloride 1,000 mL (08/01/15 0914)   PRN Meds:.acetaminophen, LORazepam **OR** LORazepam, ondansetron **OR** ondansetron (ZOFRAN) IV Assessment/Plan: Principal Problem:   Hematemesis Active Problems:   Alcohol abuse   Seizure disorder (HCC)   Severe protein-calorie malnutrition (HCC)   Hypoalbuminemia   FSGS (focal segmental glomerulosclerosis)   Anemia of chronic disease   Lupus (systemic lupus erythematosus) (HCC)   Diarrhea   ESRD (end stage renal disease) on dialysis (HCC)   Lactic acidosis   Essential hypertension   Fever,  unspecified   GIB (gastrointestinal bleeding)  [name] [name] is a 60 yo female with 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   C. Difficile Colitis: Patient was febrile on admission with soft BPs and complaint of loose stools for the past 2 weeks. On prior admission in January, C. Diff and GI pathogen panel was negative and patient improved on Imodium. CT abdomen/pelvis on 2/24 revealed mild diffuse circumferential wall thickening of colon concerning for infectious/inflammatory colitis. C. Diff positive for both toxin and antigen on 2/25. Patient was febrile to 102.7 on 2/24, but has been afebrile for 24 hours. WBC 14.4 to 9.5 this morning. BCx NGTD x 1 day. Urinalysis was initially concerning for infection even in the absence of symptoms, but now that patient has an alternative likely source of infection, we will d/c Ceftriaxone and continue to follow up the urine culture. Urine Culture is pending. -BCx 2/24>> NGTD -UCx 2/24>> -NS 75 cc/hr, caution in setting of ESRD, maintain MAP >65 -Continue to trend lactic acid until normal -Vancomycin 125 mg po QID  Hematemesis: No history of varices despite suspected cirrhosis. Patient presents with blood streaked emesis during dialysis. No recurrence of nausea or vomiting. Vomiting may have been due to a Mallory-Weis tear. FOBT negative. Hemoglobin has been stable at her baseline 8-9, although there was an apparent drop from admission which was likely an outlier. Gastroenterology is following, but does not feel further work up is indicated at this time. 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 No indications for emergent dialysis as electrolytes are stable, patient is neither short of breath  or volume  overloaded despite IVF. -Nephrology has been consulted for HD tomorrow -HD MWF -Repeat RFP tomorrow am -Continue Imuran 50 mg  -Continue Plaquenil 400 mg   Thrombocytopenia: Platelets chronically low. Most recently in the 63s. No recurrence of bleeding, FOBT negative. I would consider transfusing platelets if platelets less than 30 and patient is re-bleeding or febrile. Would transfuse less than 10 if neither bleeding nor febrile.  - Repeat CBC tomorrow am -Transfuse if PLT <30 and bleeding or febrile based on current recommedations -Transfuse if PLT <10 regardless  Chronic Pancreatitis and Alcohol Abuse: CIWAs have been 0.  -CIWA -Creon TID WC -Thiamine, folate, B12 -Consider MRCP as outpatient  DVT/PE ppx: SCDs  Dispo: Disposition is deferred at this time, awaiting improvement of current medical problems.  Anticipated discharge in approximately 1-2 day(s).   The patient does have a current PCP Shela Leff, MD) and does need an [facility] hospital follow-up appointment after discharge.  The patient does not have transportation limitations that hinder transportation to clinic appointments.  .Services Needed at time of discharge: Y = Yes, Blank = No PT:   OT:   RN:   Equipment:   Other:     LOS: 2 days   Osa Craver, DO PGY-2 Internal Medicine Resident Pager # [phone] 08/01/2015 12:56 PM

## 2015-08-01 NOTE — Progress Notes (Signed)
CRITICAL VALUE ALERT  Critical value received: lactic acid =  4.0  Date of notification:  08/01/2015  Time of notification:  1650  Critical value read back:Yes.    Nurse who received alert:  Isabell Jarvis  MD notified (1st page):  Dr. Osa Craver  Time of first page:  1651  MD notified (2nd page):  Time of second page:  Responding MD:  Dr. Osa Craver  Time MD responded:  [phone]

## 2015-08-02 ENCOUNTER — Telehealth: Payer: Self-pay | Admitting: Internal Medicine

## 2015-08-02 DIAGNOSIS — N186 End stage renal disease: Secondary | ICD-10-CM | POA: Diagnosis not present

## 2015-08-02 DIAGNOSIS — N390 Urinary tract infection, site not specified: Secondary | ICD-10-CM | POA: Diagnosis not present

## 2015-08-02 DIAGNOSIS — D631 Anemia in chronic kidney disease: Secondary | ICD-10-CM | POA: Diagnosis not present

## 2015-08-02 DIAGNOSIS — Z992 Dependence on renal dialysis: Secondary | ICD-10-CM | POA: Diagnosis not present

## 2015-08-02 DIAGNOSIS — I120 Hypertensive chronic kidney disease with stage 5 chronic kidney disease or end stage renal disease: Secondary | ICD-10-CM | POA: Diagnosis not present

## 2015-08-02 DIAGNOSIS — K920 Hematemesis: Secondary | ICD-10-CM | POA: Diagnosis not present

## 2015-08-02 LAB — RENAL FUNCTION PANEL
Albumin: 1.2 g/dL — ABNORMAL LOW (ref 3.5–5.0)
Anion gap: 10 (ref 5–15)
BUN: 13 mg/dL (ref 6–20)
CHLORIDE: 110 mmol/L (ref 101–111)
CO2: 16 mmol/L — AB (ref 22–32)
Calcium: 7.2 mg/dL — ABNORMAL LOW (ref 8.9–10.3)
Creatinine, Ser: 3.98 mg/dL — ABNORMAL HIGH (ref 0.44–1.00)
GFR calc Af Amer: 13 mL/min — ABNORMAL LOW (ref >60)
GFR calc non Af Amer: 11 mL/min — ABNORMAL LOW (ref >60)
GLUCOSE: 80 mg/dL (ref 65–99)
POTASSIUM: 4.1 mmol/L (ref 3.5–5.1)
Phosphorus: 1.7 mg/dL — ABNORMAL LOW (ref 2.5–4.6)
Sodium: 136 mmol/L (ref 135–145)

## 2015-08-02 LAB — CBC
HEMATOCRIT: 27 % — AB (ref 36.0–46.0)
Hemoglobin: 8.8 g/dL — ABNORMAL LOW (ref 12.0–15.0)
MCH: 33 pg (ref 26.0–34.0)
MCHC: 32.6 g/dL (ref 30.0–36.0)
MCV: 101.1 fL — AB (ref 78.0–100.0)
PLATELETS: 39 10*3/uL — AB (ref 150–400)
RBC: 2.67 MIL/uL — ABNORMAL LOW (ref 3.87–5.11)
RDW: 19.1 % — AB (ref 11.5–15.5)
WBC: 6.2 10*3/uL (ref 4.0–10.5)

## 2015-08-02 LAB — LACTIC ACID, PLASMA: Lactic Acid, Venous: 1.9 mmol/L (ref 0.5–2.0)

## 2015-08-02 MED ORDER — CEFTRIAXONE SODIUM 1 G IJ SOLR
1.0000 g | Freq: Once | INTRAMUSCULAR | Status: DC
  Filled 2015-08-02: qty 10

## 2015-08-02 MED ORDER — FOSFOMYCIN TROMETHAMINE 3 G PO PACK: 3.0000 g | PACK | Freq: Once | ORAL | Status: DC

## 2015-08-02 MED ORDER — VANCOMYCIN 50 MG/ML ORAL SOLUTION: 125.0000 mg | Freq: Four times a day (QID) | ORAL | Status: DC

## 2015-08-02 MED ORDER — DARBEPOETIN ALFA 150 MCG/0.3ML IJ SOSY: 150.0000 ug | PREFILLED_SYRINGE | INTRAMUSCULAR | Status: DC

## 2015-08-02 MED ORDER — FOSFOMYCIN TROMETHAMINE 3 G PO PACK
3.0000 g | PACK | Freq: Once | ORAL | Status: AC
  Administered 2015-08-02: 3 g via ORAL
  Filled 2015-08-02: qty 3

## 2015-08-02 NOTE — Consult Note (Addendum)
   [facility] CM Inpatient Consult   08/02/2015  ANALAYA HOEY 05/08/1956 [id number] Patient was assessed for [place] [facility] for community services. Patient was previously active with [facility].  Met with patient at bedside regarding being restarted with [facility] services.  Patient has an active consent in the file/EPIC.  Patient states she would like a nurse to follow up with her after discharge.  Patient states her hemodialysis days are Monday, Wednesday, and Friday. Patient states that the best time to contact her is on Tuesdays and Thursdays after 11 am.  States, "I am pretty wiped out on my dialysis days.  Patient states her best phone number is [phone].    Of note, [facility] services does  not replace or interfere with any services that are arranged by inpatient case management or social work. For additional questions or referrals please contact: Natividad Brood, RN BSN [facility] Liaison  [phone] business mobile phone Toll free office [phone]  Addendum: Patient endorses that she has [employer] and she is active with [facility] for her primary care providers.

## 2015-08-02 NOTE — Discharge Instructions (Signed)
1. Take Vancomycin 2.5 mL by mouth four times daily until March 4th. 2. On March 2nd, take Fosfomycin 3 grams once. 3. Continue dialysis and your other medications as prescribed.

## 2015-08-02 NOTE — Progress Notes (Signed)
Subjective:  [name].  She has had continued loose stools but denies fever, nausea, or vomiting.  She feels like she can go home.   Objective: Vital signs in last 24 hours: Filed Vitals:   08/01/15 2100 08/02/15 0453 08/02/15 0647 08/02/15 0700  BP: 137/85 114/69 106/76 107/70  Pulse: 97 92 96 98  Temp: 98.3 F (36.8 C) 98.7 F (37.1 C) 98.3 F (36.8 C)   TempSrc:  Oral Oral   Resp: 17 16 15 16   Weight:  86 lb (39.009 kg) 86 lb 3.2 oz (39.1 kg)   SpO2: 100% 100% 100%    Weight change: 1 lb 11.2 oz (0.771 kg)  Intake/Output Summary (Last 24 hours) at 08/02/15 0720 Last data filed at 08/02/15 0330  Gross per 24 hour  Intake    220 ml  Output      0 ml  Net    220 ml   General: Vital signs reviewed.  Patient is chronically ill appearing, in no acute distress and cooperative with exam.  Cardiovascular: Borderline tachy, regular rhythm Pulmonary/Chest: Clear to auscultation bilaterally, no wheezes, rales, or rhonchi. Abdominal: Soft, non-tender, non-distended, BS +.  Extremities: LUE AVF with good thrill and bruit, no erythema or signs of infection. No lower extremity edema bilaterally.  Lab Results: Basic Metabolic Panel:  Recent Labs Lab 08/01/15 0643 08/02/15 0433  NA 137 136  K 3.8 4.1  CL 110 110  CO2 19* 16*  GLUCOSE 136* 80  BUN 9 13  CREATININE 3.73* 3.98*  CALCIUM 7.1* 7.2*  PHOS  --  1.7*   Liver Function Tests:  Recent Labs Lab 07/30/15 1349 07/31/15 0538 08/02/15 0433  AST 44* 74*  --   ALT 9* 9*  --   ALKPHOS 386* 389*  --   BILITOT 0.6 1.4*  --   PROT 6.8 6.0*  --   ALBUMIN 1.4* 1.2* 1.2*   CBC:  Recent Labs Lab 07/30/15 0955  08/01/15 0643 08/02/15 0433  WBC 9.2  < > 9.5 6.2  NEUTROABS 7.6  --   --   --   HGB 12.0  < > 8.7* 8.8*  HCT 36.7  < > 27.1* 27.0*  MCV 105.8*  < > 101.9* 101.1*  PLT 60*  < > 43* PENDING  < > = values in this interval not displayed.  Coagulation:  Recent Labs Lab 07/30/15 1349 07/31/15 0538    LABPROT 23.5* 27.0*  INR 2.12* 2.54*   Urine Drug Screen: Drugs of Abuse     Component Value Date/Time   LABOPIA NONE DETECTED 07/16/2015 1400   LABOPIA NEG 09/18/2011 0936   COCAINSCRNUR NONE DETECTED 07/16/2015 1400   COCAINSCRNUR NEG 09/18/2011 0936   LABBENZ NONE DETECTED 07/16/2015 1400   LABBENZ NEG 09/18/2011 0936   LABBENZ NEG 04/10/2011 1130   AMPHETMU NONE DETECTED 07/16/2015 1400   AMPHETMU NEG 09/18/2011 0936   AMPHETMU NEG 04/10/2011 1130   THCU NONE DETECTED 07/16/2015 1400   THCU NEG 09/18/2011 0936   LABBARB NONE DETECTED 07/16/2015 1400   LABBARB NEG 09/18/2011 0936    Urinalysis:  Recent Labs Lab 07/30/15 1650  COLORURINE YELLOW  LABSPEC 1.009  PHURINE 7.0  GLUCOSEU NEGATIVE  HGBUR MODERATE*  BILIRUBINUR NEGATIVE  KETONESUR NEGATIVE  PROTEINUR 100*  NITRITE NEGATIVE  LEUKOCYTESUR MODERATE*   Micro Results: Recent Results (from the past 240 hour(s))  Blood culture (routine x 2)     Status: None (Preliminary result)   Collection Time: 07/30/15 12:00 PM  Result Value Ref Range Status   Specimen Description BLOOD RIGHT ARM  Final   Special Requests BOTTLES DRAWN AEROBIC AND ANAEROBIC 5MLS  Final   Culture NO GROWTH 2 DAYS  Final   Report Status PENDING  Incomplete  Blood culture (routine x 2)     Status: None (Preliminary result)   Collection Time: 07/30/15 12:10 PM  Result Value Ref Range Status   Specimen Description BLOOD RIGHT HAND  Final   Special Requests BOTTLES DRAWN AEROBIC AND ANAEROBIC 5MLS  Final   Culture NO GROWTH 2 DAYS  Final   Report Status PENDING  Incomplete  Culture, Urine     Status: None (Preliminary result)   Collection Time: 07/30/15  4:50 PM  Result Value Ref Range Status   Specimen Description URINE, CLEAN CATCH  Final   Special Requests NONE  Final   Culture >=100,000 COLONIES/mL ESCHERICHIA COLI  Final   Report Status PENDING  Incomplete  MRSA PCR Screening     Status: None   Collection Time: 07/30/15  9:29 PM   Result Value Ref Range Status   MRSA by PCR NEGATIVE NEGATIVE Final    Comment:        The GeneXpert MRSA Assay (FDA approved for NASAL specimens only), is one component of a comprehensive MRSA colonization surveillance program. It is not intended to diagnose MRSA infection nor to guide or monitor treatment for MRSA infections.   C difficile quick scan w PCR reflex     Status: Abnormal   Collection Time: 07/31/15  5:50 PM  Result Value Ref Range Status   C Diff antigen POSITIVE (A) NEGATIVE Final   C Diff toxin POSITIVE (A) NEGATIVE Final   C Diff interpretation Positive for toxigenic C. difficile  Final    Comment: CRITICAL RESULT CALLED TO, READ BACK BY AND VERIFIED WITH: RN KOME,J AT 2016 MARTINB PP:[id number]    Studies/Results: No results found. Medications:  I have reviewed the patient's current medications. Prior to Admission:  Prescriptions prior to admission  Medication Sig Dispense Refill Last Dose  . albuterol (PROAIR HFA) 108 (90 BASE) MCG/ACT inhaler Inhale 1-2 puffs into the lungs every 6 (six) hours as needed for wheezing or shortness of breath. 1 Inhaler 1 Past Month at Unknown time  . azaTHIOprine (IMURAN) 50 MG tablet Take 50 mg by mouth daily.   07/29/2015 at Unknown time  . calcium carbonate (TUMS - DOSED IN MG ELEMENTAL CALCIUM) 500 MG chewable tablet Chew 1,000 tablets by mouth at bedtime.   07/29/2015 at Unknown time  . cetirizine (ZYRTEC) 10 MG tablet Take 1 tablet (10 mg total) by mouth daily as needed for allergies. 30 tablet 3 Past Month at Unknown time  . CREON 12000 UNITS CPEP capsule Take 1 capsule (12,000 Units total) by mouth 3 (three) times daily before meals. 270 capsule 3 07/29/2015 at Unknown time  . FLUoxetine (PROZAC) 10 MG capsule Take 1 capsule (10 mg total) by mouth daily. For depression 90 capsule 3 07/29/2015 at Unknown time  . folic acid (FOLVITE) 1 MG tablet Take 1 tablet (1 mg total) by mouth daily. For folic acid replacement 30 tablet 6  07/29/2015 at Unknown time  . gabapentin (NEURONTIN) 300 MG capsule Take 1 capsule (300 mg total) by mouth daily. 90 capsule 3 07/29/2015 at Unknown time  . hydroxychloroquine (PLAQUENIL) 200 MG tablet Take 400 mg by mouth daily.   07/29/2015 at Unknown time  . Lactobacillus (ACIDOPHILUS PROBIOTIC PO) Take 2 mg by mouth  3 (three) times daily. Take 2 (two) tablets by mouth three times daily   07/29/2015 at Unknown time  . levETIRAcetam (KEPPRA) 250 MG tablet Take 1 tablet (250 mg total) by mouth daily. Take additional 500mg  (2 tablets) after Dialysis treatments on Monday, Wednesday, Friday 30 tablet 0 07/29/2015 at Unknown time  . mirtazapine (REMERON) 7.5 MG tablet Take 1 tablet (7.5 mg total) by mouth at bedtime. 90 tablet 3 07/29/2015 at Unknown time  . Multiple Vitamin (MULTIVITAMIN WITH MINERALS) TABS tablet Take 1 tablet by mouth daily. For vitamin replacement 90 tablet 3 07/29/2015 at Unknown time  . omeprazole (PRILOSEC) 40 MG capsule Take 1 capsule (40 mg total) by mouth daily. 90 capsule 3 07/29/2015 at Unknown time  . thiamine (VITAMIN B-1) 100 MG tablet Take 1 tablet (100 mg total) by mouth daily. For low thiamine 90 tablet 3 07/29/2015 at Unknown time  . vitamin B-12 (CYANOCOBALAMIN) 250 MCG tablet Take 1 tablet (250 mcg total) by mouth every evening. 90 tablet 3 07/29/2015 at Unknown time  . camphor-menthol (SARNA) lotion Apply 1 application topically daily as needed for itching.   unknown at unknown  . loperamide (IMODIUM) 2 MG capsule Take 2 mg by mouth every 6 (six) hours as needed for diarrhea or loose stools.   unknown at unknown  . nystatin (MYCOSTATIN) 100000 UNIT/ML suspension Take 5 mLs (500,000 Units total) by mouth 4 (four) times daily. 60 mL 0    Scheduled Meds: . azaTHIOprine  50 mg Oral Daily  . calcitRIOL  1.25 mcg Oral Q M,W,F-HD  . calcium carbonate  500 mg Oral QHS  . [START ON 08/04/2015] darbepoetin (ARANESP) injection - DIALYSIS  150 mcg Intravenous Q Wed-HD  . FLUoxetine  10  mg Oral Daily  . folic acid  1 mg Oral Daily  . gabapentin  300 mg Oral Daily  . hydroxychloroquine  400 mg Oral Daily  . levETIRAcetam  250 mg Oral Daily   And  . levETIRAcetam  500 mg Oral Once per day on Mon Wed Fri  . lipase/protease/amylase  12,000 Units Oral TID AC  . mirtazapine  7.5 mg Oral QHS  . multivitamin with minerals  1 tablet Oral Daily  . pantoprazole (PROTONIX) IV  40 mg Intravenous Q12H  . sucralfate  1 g Oral TID WC & HS  . thiamine  100 mg Oral Daily   Or  . thiamine  100 mg Intravenous Daily  . vancomycin  125 mg Oral QID  . vitamin B-12  250 mcg Oral QPM   Continuous Infusions:   PRN Meds:.acetaminophen, LORazepam **OR** LORazepam, ondansetron **OR** ondansetron (ZOFRAN) IV Assessment/Plan: Principal Problem:   Hematemesis Active Problems:   Alcohol abuse   Seizure disorder (HCC)   Severe protein-calorie malnutrition (HCC)   Hypoalbuminemia   FSGS (focal segmental glomerulosclerosis)   Anemia of chronic disease   Lupus (systemic lupus erythematosus) (HCC)   Diarrhea   ESRD (end stage renal disease) on dialysis (HCC)   Lactic acidosis   Essential hypertension   Fever, unspecified   GIB (gastrointestinal bleeding)  Ms. 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   C. Difficile Colitis: Patient was febrile on admission with soft BPs and complaint of loose stools for the past 2 weeks. On prior admission in January, C. Diff and GI pathogen panel was  negative and patient improved on Imodium. CT abdomen/pelvis on 2/24 revealed mild diffuse circumferential wall thickening of colon concerning for infectious/inflammatory colitis. C. Diff positive for both toxin and antigen on 2/25. Patient was febrile to 102.7 on 2/24, but has been afebrile for 24 hours. WBC 14.4 to 9.5 this morning.  BCx NGTD x 1 day.  -BCx 2/24>> NGTD -UCx 2/24>> ESBL E. Coli, asymptomatic -Vancomycin 125 mg po QID x 10 days (2/26 - 3/7)  Asymptomatic Bacteruria: UCx growing ESBL E coli. Sensitive to carbapenems, Zosyn, and Bactrim.  Pharmacy also recommends Fosfomycin as therapy for cystitis, with one dose now and then one dose in 72 hours.  Will request sensitivities for Fosfomycin and have patient repeat U/A as outpatient for evidence of cure. [ ]  Fosfomycin sensitivities - Fosfomycin 3 g now and 3g in 72 hours.  Hematemesis, resolved: No history of varices despite suspected cirrhosis. Patient presents with blood streaked emesis during dialysis. No recurrence of nausea or vomiting. Vomiting may have been due to a Mallory-Weis tear. FOBT negative. Hemoglobin has been stable at her baseline 8-9, although there was an apparent drop from admission which was likely an outlier. Gastroenterology is following, but does not feel further work up is indicated at this time. 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 No indications for emergent dialysis as electrolytes are stable, patient is neither short of breath or volume overloaded despite IVF. -Nephrology has been consulted for HD tomorrow -Dialysis MWF -Continue Imuran 50 mg  -Continue Plaquenil 400 mg   Thrombocytopenia: Platelets chronically low. Most recently in the 66s. No recurrence of bleeding, FOBT negative. I would consider transfusing platelets if platelets less than 30 and patient is re-bleeding or febrile. Would transfuse less than 10 if neither bleeding nor febrile.  -Transfuse if PLT <30 and bleeding or febrile based on current recommedations -Transfuse if PLT <10 regardless  Chronic Pancreatitis and Alcohol Abuse: CIWAs have been 0.  -CIWA -Creon TID WC -Thiamine, folate, B12 -Consider MRCP as outpatient  DVT/PE ppx: SCDs  Dispo: Disposition is deferred at this  time, awaiting improvement of current medical problems.  Anticipated discharge in approximately 1-2 day(s).   The patient does have a current PCP Shela Leff, MD) and does need an [facility] hospital follow-up appointment after discharge.  The patient does not have transportation limitations that hinder transportation to clinic appointments.  .Services Needed at time of discharge: Y = Yes, Blank = No PT:   OT:   RN:   Equipment:   Other:     LOS: 3 days   Viviano Simas, MD  08/02/2015 7:20 AM

## 2015-08-02 NOTE — Progress Notes (Signed)
Internal Medicine Attending  Date: 08/02/2015  Patient name: [name] [name] Medical record number: [id number] Date of birth: [birth year] Age: 60 y.o. Gender: female  I saw and evaluated the patient. I reviewed the resident's note by Dr. Lovena Le and I agree with the resident's findings and plans as documented in his progress note.  [name] was seen on rounds this morning while she was in dialysis. Although she notes continued loose stools she feels improved on the oral vancomycin and would like to go home after dialysis. She is tolerating her diet well and this is a reasonable request. She will [facility] to assure resolution of her C. Diff diarrhea on the oral vancomycin.

## 2015-08-02 NOTE — Procedures (Signed)
I was present at this dialysis session. I have reviewed the session itself and made appropriate changes.   Here with UGIB improved, +C dif colitis on PO Vvanc, ongoing ETOH use and progressive decline overall.    UF goal 2L with AVG. Cont curernt plan.   Pearson Grippe  MD 08/02/2015, 9:16 AM

## 2015-08-02 NOTE — Care Management Obs Status (Signed)
[facility] NOTIFICATION   Patient Details  Name: [name] [name] MRN: [id number] Date of Birth: 04-07-1956   Medicare Observation Status Notification Given:  Yes    Bethena Roys, RN 08/02/2015, 2:47 PM

## 2015-08-02 NOTE — Telephone Encounter (Signed)
APPT. REMINDER CALL, PT HAS BEEN ADMITTED TO [facility].

## 2015-08-03 ENCOUNTER — Ambulatory Visit: Payer: Self-pay | Admitting: Internal Medicine

## 2015-08-03 ENCOUNTER — Other Ambulatory Visit: Payer: Self-pay | Admitting: *Deleted

## 2015-08-03 DIAGNOSIS — N186 End stage renal disease: Secondary | ICD-10-CM

## 2015-08-03 DIAGNOSIS — N031 Chronic nephritic syndrome with focal and segmental glomerular lesions: Secondary | ICD-10-CM | POA: Diagnosis not present

## 2015-08-03 DIAGNOSIS — I509 Heart failure, unspecified: Secondary | ICD-10-CM

## 2015-08-03 DIAGNOSIS — Z992 Dependence on renal dialysis: Secondary | ICD-10-CM | POA: Diagnosis not present

## 2015-08-03 DIAGNOSIS — I10 Essential (primary) hypertension: Secondary | ICD-10-CM

## 2015-08-03 NOTE — Patient Outreach (Signed)
Referral received from hospital liaison.  Member was admitted to hospital for hematemesis and alcohol abuse.  She was discharged on 2/27.  According to chart, she also has a history of congestive heart failure, hypertension, end state renal disease, acid reflux, and depression.  Call placed to member, she reports that she is "good."  She denies any concerns or questions regarding discharge.  She has a follow up appointment scheduled for next week.  She does report concern regarding her medication that was ordered at discharge, Vancomycin, stating that she was unable to afford it ($69).  Member notified that a pharmacy consult was competed for medication assistance.    Member denies any further questions or concerns, stating that she has a great support system (husband and daughter).  Contact information provided, encouraged to contact with concerns.  [name], BSN, [place] [facility]  [facility] Care Manager [phone]

## 2015-08-04 LAB — CULTURE, BLOOD (ROUTINE X 2)
CULTURE: NO GROWTH
Culture: NO GROWTH

## 2015-08-05 ENCOUNTER — Other Ambulatory Visit: Payer: Self-pay | Admitting: Pharmacist

## 2015-08-05 ENCOUNTER — Observation Stay (HOSPITAL_BASED_OUTPATIENT_CLINIC_OR_DEPARTMENT_OTHER)
Admission: EM | Admit: 2015-08-05 | Discharge: 2015-08-07 | Disposition: A | Discharge status: Home / Self Care | Payer: Medicare Other | Source: Home / Self Care | Attending: Emergency Medicine | Admitting: Emergency Medicine

## 2015-08-05 ENCOUNTER — Encounter (HOSPITAL_COMMUNITY): Payer: Self-pay

## 2015-08-05 ENCOUNTER — Emergency Department (HOSPITAL_COMMUNITY): Payer: Medicare Other

## 2015-08-05 ENCOUNTER — Emergency Department (HOSPITAL_COMMUNITY)
Admission: EM | Admit: 2015-08-05 | Discharge: 2015-08-05 | Disposition: A | Discharge status: Home / Self Care | Payer: Medicare Other | Attending: Internal Medicine | Admitting: Internal Medicine

## 2015-08-05 DIAGNOSIS — B9620 Unspecified Escherichia coli [E. coli] as the cause of diseases classified elsewhere: Secondary | ICD-10-CM | POA: Diagnosis not present

## 2015-08-05 DIAGNOSIS — J09X2 Influenza due to identified novel influenza A virus with other respiratory manifestations: Secondary | ICD-10-CM | POA: Diagnosis not present

## 2015-08-05 DIAGNOSIS — E781 Pure hyperglyceridemia: Secondary | ICD-10-CM | POA: Insufficient documentation

## 2015-08-05 DIAGNOSIS — F1020 Alcohol dependence, uncomplicated: Secondary | ICD-10-CM | POA: Insufficient documentation

## 2015-08-05 DIAGNOSIS — E860 Dehydration: Secondary | ICD-10-CM

## 2015-08-05 DIAGNOSIS — A0472 Enterocolitis due to Clostridium difficile, not specified as recurrent: Secondary | ICD-10-CM

## 2015-08-05 DIAGNOSIS — I132 Hypertensive heart and chronic kidney disease with heart failure and with stage 5 chronic kidney disease, or end stage renal disease: Secondary | ICD-10-CM | POA: Diagnosis not present

## 2015-08-05 DIAGNOSIS — R109 Unspecified abdominal pain: Secondary | ICD-10-CM | POA: Diagnosis present

## 2015-08-05 DIAGNOSIS — M329 Systemic lupus erythematosus, unspecified: Secondary | ICD-10-CM | POA: Diagnosis not present

## 2015-08-05 DIAGNOSIS — Z1612 Extended spectrum beta lactamase (ESBL) resistance: Secondary | ICD-10-CM | POA: Diagnosis not present

## 2015-08-05 DIAGNOSIS — I5032 Chronic diastolic (congestive) heart failure: Secondary | ICD-10-CM | POA: Insufficient documentation

## 2015-08-05 DIAGNOSIS — R05 Cough: Secondary | ICD-10-CM | POA: Diagnosis not present

## 2015-08-05 DIAGNOSIS — D696 Thrombocytopenia, unspecified: Secondary | ICD-10-CM | POA: Insufficient documentation

## 2015-08-05 DIAGNOSIS — J111 Influenza due to unidentified influenza virus with other respiratory manifestations: Secondary | ICD-10-CM

## 2015-08-05 DIAGNOSIS — D510 Vitamin B12 deficiency anemia due to intrinsic factor deficiency: Secondary | ICD-10-CM | POA: Insufficient documentation

## 2015-08-05 DIAGNOSIS — Z992 Dependence on renal dialysis: Secondary | ICD-10-CM | POA: Insufficient documentation

## 2015-08-05 DIAGNOSIS — I950 Idiopathic hypotension: Secondary | ICD-10-CM | POA: Diagnosis not present

## 2015-08-05 DIAGNOSIS — R627 Adult failure to thrive: Secondary | ICD-10-CM | POA: Diagnosis not present

## 2015-08-05 DIAGNOSIS — F1010 Alcohol abuse, uncomplicated: Secondary | ICD-10-CM | POA: Diagnosis present

## 2015-08-05 DIAGNOSIS — E43 Unspecified severe protein-calorie malnutrition: Secondary | ICD-10-CM | POA: Insufficient documentation

## 2015-08-05 DIAGNOSIS — R8271 Bacteriuria: Secondary | ICD-10-CM | POA: Insufficient documentation

## 2015-08-05 DIAGNOSIS — D638 Anemia in other chronic diseases classified elsewhere: Secondary | ICD-10-CM | POA: Diagnosis present

## 2015-08-05 DIAGNOSIS — G40909 Epilepsy, unspecified, not intractable, without status epilepticus: Secondary | ICD-10-CM | POA: Insufficient documentation

## 2015-08-05 DIAGNOSIS — Z9981 Dependence on supplemental oxygen: Secondary | ICD-10-CM | POA: Insufficient documentation

## 2015-08-05 DIAGNOSIS — Z681 Body mass index (BMI) 19 or less, adult: Secondary | ICD-10-CM | POA: Diagnosis not present

## 2015-08-05 DIAGNOSIS — K7460 Unspecified cirrhosis of liver: Secondary | ICD-10-CM | POA: Insufficient documentation

## 2015-08-05 DIAGNOSIS — A047 Enterocolitis due to Clostridium difficile: Secondary | ICD-10-CM | POA: Diagnosis not present

## 2015-08-05 DIAGNOSIS — Z87891 Personal history of nicotine dependence: Secondary | ICD-10-CM | POA: Insufficient documentation

## 2015-08-05 DIAGNOSIS — A498 Other bacterial infections of unspecified site: Secondary | ICD-10-CM | POA: Diagnosis present

## 2015-08-05 DIAGNOSIS — F329 Major depressive disorder, single episode, unspecified: Secondary | ICD-10-CM | POA: Diagnosis not present

## 2015-08-05 DIAGNOSIS — D689 Coagulation defect, unspecified: Secondary | ICD-10-CM | POA: Diagnosis present

## 2015-08-05 DIAGNOSIS — R262 Difficulty in walking, not elsewhere classified: Secondary | ICD-10-CM | POA: Diagnosis not present

## 2015-08-05 DIAGNOSIS — K861 Other chronic pancreatitis: Secondary | ICD-10-CM | POA: Insufficient documentation

## 2015-08-05 DIAGNOSIS — D631 Anemia in chronic kidney disease: Secondary | ICD-10-CM | POA: Diagnosis not present

## 2015-08-05 DIAGNOSIS — N186 End stage renal disease: Secondary | ICD-10-CM | POA: Insufficient documentation

## 2015-08-05 DIAGNOSIS — K219 Gastro-esophageal reflux disease without esophagitis: Secondary | ICD-10-CM | POA: Diagnosis present

## 2015-08-05 DIAGNOSIS — J101 Influenza due to other identified influenza virus with other respiratory manifestations: Secondary | ICD-10-CM | POA: Diagnosis present

## 2015-08-05 DIAGNOSIS — R509 Fever, unspecified: Secondary | ICD-10-CM | POA: Diagnosis not present

## 2015-08-05 DIAGNOSIS — Z79899 Other long term (current) drug therapy: Secondary | ICD-10-CM | POA: Insufficient documentation

## 2015-08-05 DIAGNOSIS — F32A Depression, unspecified: Secondary | ICD-10-CM | POA: Diagnosis present

## 2015-08-05 LAB — CBC
HCT: 28.6 % — ABNORMAL LOW (ref 36.0–46.0)
HEMATOCRIT: 30 % — AB (ref 36.0–46.0)
Hemoglobin: 9 g/dL — ABNORMAL LOW (ref 12.0–15.0)
Hemoglobin: 9.7 g/dL — ABNORMAL LOW (ref 12.0–15.0)
MCH: 32.8 pg (ref 26.0–34.0)
MCH: 33 pg (ref 26.0–34.0)
MCHC: 31.5 g/dL (ref 30.0–36.0)
MCHC: 32.3 g/dL (ref 30.0–36.0)
MCV: 102 fL — AB (ref 78.0–100.0)
MCV: 104.4 fL — ABNORMAL HIGH (ref 78.0–100.0)
Platelets: 40 10*3/uL — ABNORMAL LOW (ref 150–400)
Platelets: 46 10*3/uL — ABNORMAL LOW (ref 150–400)
RBC: 2.74 MIL/uL — ABNORMAL LOW (ref 3.87–5.11)
RBC: 2.94 MIL/uL — ABNORMAL LOW (ref 3.87–5.11)
RDW: 16.8 % — ABNORMAL HIGH (ref 11.5–15.5)
RDW: 17 % — AB (ref 11.5–15.5)
WBC: 4.2 10*3/uL (ref 4.0–10.5)
WBC: 5.5 10*3/uL (ref 4.0–10.5)

## 2015-08-05 LAB — COMPREHENSIVE METABOLIC PANEL
ALBUMIN: 1.5 g/dL — AB (ref 3.5–5.0)
ALK PHOS: 315 U/L — AB (ref 38–126)
ALT: 15 U/L (ref 14–54)
AST: 65 U/L — AB (ref 15–41)
Anion gap: 15 (ref 5–15)
BILIRUBIN TOTAL: 2.3 mg/dL — AB (ref 0.3–1.2)
BUN: 5 mg/dL — AB (ref 6–20)
CALCIUM: 7.6 mg/dL — AB (ref 8.9–10.3)
CO2: 19 mmol/L — ABNORMAL LOW (ref 22–32)
Chloride: 102 mmol/L (ref 101–111)
Creatinine, Ser: 4.8 mg/dL — ABNORMAL HIGH (ref 0.44–1.00)
GFR calc Af Amer: 11 mL/min — ABNORMAL LOW (ref >60)
GFR, EST NON AFRICAN AMERICAN: 9 mL/min — AB (ref >60)
GLUCOSE: 79 mg/dL (ref 65–99)
Potassium: 3.5 mmol/L (ref 3.5–5.1)
Sodium: 136 mmol/L (ref 135–145)
TOTAL PROTEIN: 7.7 g/dL (ref 6.5–8.1)

## 2015-08-05 LAB — PROTIME-INR
INR: 4.93 — AB (ref 0.00–1.49)
PROTHROMBIN TIME: 44.5 s — AB (ref 11.6–15.2)

## 2015-08-05 LAB — APTT: aPTT: 54 seconds — ABNORMAL HIGH (ref 24–37)

## 2015-08-05 LAB — I-STAT TROPONIN, ED: TROPONIN I, POC: 0.07 ng/mL (ref 0.00–0.08)

## 2015-08-05 LAB — I-STAT CG4 LACTIC ACID, ED: Lactic Acid, Venous: 3.76 mmol/L (ref 0.5–2.0)

## 2015-08-05 LAB — LIPASE, BLOOD: LIPASE: 17 U/L (ref 11–51)

## 2015-08-05 LAB — CBG MONITORING, ED: Glucose-Capillary: 77 mg/dL (ref 65–99)

## 2015-08-05 MED ORDER — ADULT MULTIVITAMIN W/MINERALS CH
1.0000 | ORAL_TABLET | Freq: Every day | ORAL | Status: DC
  Administered 2015-08-06: 1 via ORAL
  Filled 2015-08-05 (×2): qty 1

## 2015-08-05 MED ORDER — LIDOCAINE HCL (PF) 1 % IJ SOLN: 5.0000 mL | INTRAMUSCULAR | Status: DC | PRN

## 2015-08-05 MED ORDER — DARBEPOETIN ALFA 200 MCG/0.4ML IJ SOSY: 200.0000 ug | PREFILLED_SYRINGE | INTRAMUSCULAR | Status: DC

## 2015-08-05 MED ORDER — ACETAMINOPHEN 325 MG PO TABS
650.0000 mg | ORAL_TABLET | Freq: Four times a day (QID) | ORAL | Status: DC | PRN
  Administered 2015-08-06 (×2): 650 mg via ORAL
  Filled 2015-08-05 (×2): qty 2

## 2015-08-05 MED ORDER — HYDROXYCHLOROQUINE SULFATE 200 MG PO TABS
400.0000 mg | ORAL_TABLET | Freq: Every day | ORAL | Status: DC
  Administered 2015-08-05 – 2015-08-07 (×3): 400 mg via ORAL
  Filled 2015-08-05 (×3): qty 2

## 2015-08-05 MED ORDER — LIDOCAINE-PRILOCAINE 2.5-2.5 % EX CREA: 1.0000 "application " | TOPICAL_CREAM | CUTANEOUS | Status: DC | PRN

## 2015-08-05 MED ORDER — HEPARIN SODIUM (PORCINE) 1000 UNIT/ML DIALYSIS
1000.0000 [IU] | INTRAMUSCULAR | Status: DC | PRN
  Filled 2015-08-05: qty 1

## 2015-08-05 MED ORDER — HEPARIN SODIUM (PORCINE) 5000 UNIT/ML IJ SOLN: 5000.0000 [IU] | Freq: Three times a day (TID) | INTRAMUSCULAR | Status: DC

## 2015-08-05 MED ORDER — MIRTAZAPINE 15 MG PO TABS
7.5000 mg | ORAL_TABLET | Freq: Every day | ORAL | Status: DC
  Administered 2015-08-05 – 2015-08-06 (×2): 7.5 mg via ORAL
  Filled 2015-08-05 (×2): qty 1

## 2015-08-05 MED ORDER — LIDOCAINE HCL (PF) 1 % IJ SOLN
5.0000 mL | INTRAMUSCULAR | Status: DC | PRN
  Filled 2015-08-05: qty 5

## 2015-08-05 MED ORDER — CYANOCOBALAMIN 250 MCG PO TABS
250.0000 ug | ORAL_TABLET | Freq: Every evening | ORAL | Status: DC
  Administered 2015-08-06: 250 ug via ORAL
  Filled 2015-08-05 (×4): qty 1

## 2015-08-05 MED ORDER — LEVETIRACETAM 250 MG PO TABS
250.0000 mg | ORAL_TABLET | Freq: Every day | ORAL | Status: DC
  Administered 2015-08-05 – 2015-08-07 (×3): 250 mg via ORAL
  Filled 2015-08-05 (×3): qty 1

## 2015-08-05 MED ORDER — LACTINEX PO CHEW
2.0000 | CHEWABLE_TABLET | Freq: Three times a day (TID) | ORAL | Status: DC
  Administered 2015-08-06 – 2015-08-07 (×4): 2 via ORAL
  Filled 2015-08-05 (×6): qty 2

## 2015-08-05 MED ORDER — PANCRELIPASE (LIP-PROT-AMYL) 12000-38000 UNITS PO CPEP
12000.0000 [IU] | ORAL_CAPSULE | Freq: Three times a day (TID) | ORAL | Status: DC
  Administered 2015-08-06 – 2015-08-07 (×4): 12000 [IU] via ORAL
  Filled 2015-08-05 (×4): qty 1

## 2015-08-05 MED ORDER — SODIUM CHLORIDE 0.9 % IV SOLN: 100.0000 mL | INTRAVENOUS | Status: DC | PRN

## 2015-08-05 MED ORDER — SODIUM CHLORIDE 0.9% FLUSH
3.0000 mL | Freq: Two times a day (BID) | INTRAVENOUS | Status: DC
Start: 2015-08-05 — End: 2015-08-07
  Administered 2015-08-05 – 2015-08-06 (×2): 3 mL via INTRAVENOUS

## 2015-08-05 MED ORDER — ONDANSETRON HCL 4 MG PO TABS: 4.0000 mg | ORAL_TABLET | Freq: Four times a day (QID) | ORAL | Status: DC | PRN

## 2015-08-05 MED ORDER — DM-GUAIFENESIN ER 30-600 MG PO TB12
1.0000 | ORAL_TABLET | Freq: Two times a day (BID) | ORAL | Status: DC
  Administered 2015-08-05 – 2015-08-07 (×4): 1 via ORAL
  Filled 2015-08-05 (×4): qty 1

## 2015-08-05 MED ORDER — ACETAMINOPHEN 650 MG RE SUPP: 650.0000 mg | Freq: Four times a day (QID) | RECTAL | Status: DC | PRN

## 2015-08-05 MED ORDER — DARBEPOETIN ALFA 200 MCG/0.4ML IJ SOSY
PREFILLED_SYRINGE | INTRAMUSCULAR | Status: AC
  Administered 2015-08-05: 200 ug via INTRAVENOUS
  Filled 2015-08-05: qty 0.4

## 2015-08-05 MED ORDER — AZATHIOPRINE 50 MG PO TABS
50.0000 mg | ORAL_TABLET | Freq: Every day | ORAL | Status: DC
  Administered 2015-08-06 – 2015-08-07 (×2): 50 mg via ORAL
  Filled 2015-08-05 (×3): qty 1

## 2015-08-05 MED ORDER — GABAPENTIN 300 MG PO CAPS
300.0000 mg | ORAL_CAPSULE | Freq: Every day | ORAL | Status: DC
  Administered 2015-08-05 – 2015-08-07 (×3): 300 mg via ORAL
  Filled 2015-08-05 (×3): qty 1

## 2015-08-05 MED ORDER — DARBEPOETIN ALFA 200 MCG/0.4ML IJ SOSY
200.0000 ug | PREFILLED_SYRINGE | Freq: Once | INTRAMUSCULAR | Status: AC
  Administered 2015-08-05: 200 ug via INTRAVENOUS

## 2015-08-05 MED ORDER — SODIUM CHLORIDE 0.9 % IV BOLUS (SEPSIS)
500.0000 mL | Freq: Once | INTRAVENOUS | Status: AC
  Administered 2015-08-05: 500 mL via INTRAVENOUS

## 2015-08-05 MED ORDER — ONDANSETRON HCL 4 MG/2ML IJ SOLN: 4.0000 mg | Freq: Four times a day (QID) | INTRAMUSCULAR | Status: DC | PRN

## 2015-08-05 MED ORDER — FOSFOMYCIN TROMETHAMINE 3 G PO PACK
3.0000 g | PACK | Freq: Once | ORAL | Status: AC
  Administered 2015-08-05: 3 g via ORAL
  Filled 2015-08-05: qty 3

## 2015-08-05 MED ORDER — ALTEPLASE 2 MG IJ SOLR
2.0000 mg | Freq: Once | INTRAMUSCULAR | Status: DC | PRN
  Filled 2015-08-05: qty 2

## 2015-08-05 MED ORDER — LORATADINE 10 MG PO TABS
10.0000 mg | ORAL_TABLET | Freq: Every day | ORAL | Status: DC
  Administered 2015-08-06 – 2015-08-07 (×2): 10 mg via ORAL
  Filled 2015-08-05 (×3): qty 1

## 2015-08-05 MED ORDER — FOLIC ACID 1 MG PO TABS
1.0000 mg | ORAL_TABLET | Freq: Every day | ORAL | Status: DC
  Administered 2015-08-06 – 2015-08-07 (×2): 1 mg via ORAL
  Filled 2015-08-05 (×3): qty 1

## 2015-08-05 MED ORDER — LEVETIRACETAM 500 MG PO TABS
500.0000 mg | ORAL_TABLET | ORAL | Status: DC
  Administered 2015-08-06: 500 mg via ORAL
  Filled 2015-08-05 (×2): qty 1

## 2015-08-05 MED ORDER — VANCOMYCIN HCL 125 MG PO CAPS: 125.0000 mg | ORAL_CAPSULE | Freq: Four times a day (QID) | ORAL | Status: DC

## 2015-08-05 MED ORDER — CALCITRIOL 0.25 MCG PO CAPS
1.2500 ug | ORAL_CAPSULE | ORAL | Status: DC
  Administered 2015-08-06: 1.25 ug via ORAL
  Filled 2015-08-05: qty 1

## 2015-08-05 MED ORDER — FLUOXETINE HCL 10 MG PO CAPS
10.0000 mg | ORAL_CAPSULE | Freq: Every day | ORAL | Status: DC
Start: 2015-08-05 — End: 2015-08-07
  Administered 2015-08-05 – 2015-08-07 (×3): 10 mg via ORAL
  Filled 2015-08-05 (×3): qty 1

## 2015-08-05 MED ORDER — PENTAFLUOROPROP-TETRAFLUOROETH EX AERO: 1.0000 "application " | INHALATION_SPRAY | CUTANEOUS | Status: DC | PRN

## 2015-08-05 MED ORDER — PANTOPRAZOLE SODIUM 40 MG PO TBEC
40.0000 mg | DELAYED_RELEASE_TABLET | Freq: Every day | ORAL | Status: DC
  Administered 2015-08-06 – 2015-08-07 (×2): 40 mg via ORAL
  Filled 2015-08-05 (×3): qty 1

## 2015-08-05 MED ORDER — VANCOMYCIN 50 MG/ML ORAL SOLUTION
125.0000 mg | Freq: Four times a day (QID) | ORAL | Status: DC
  Administered 2015-08-05 – 2015-08-07 (×7): 125 mg via ORAL
  Filled 2015-08-05 (×11): qty 2.5

## 2015-08-05 MED ORDER — LIDOCAINE-PRILOCAINE 2.5-2.5 % EX CREA
1.0000 "application " | TOPICAL_CREAM | CUTANEOUS | Status: DC | PRN
  Filled 2015-08-05: qty 5

## 2015-08-05 MED ORDER — VITAMIN B-1 100 MG PO TABS
100.0000 mg | ORAL_TABLET | Freq: Every day | ORAL | Status: DC
  Administered 2015-08-06 – 2015-08-07 (×2): 100 mg via ORAL
  Filled 2015-08-05 (×3): qty 1

## 2015-08-05 MED ORDER — CALCIUM CARBONATE ANTACID 500 MG PO CHEW: 1000.0000 | CHEWABLE_TABLET | Freq: Every day | ORAL | Status: DC

## 2015-08-05 MED FILL — VANCOMYCIN 50MG/ML ORAL SOL: 50MG/1ML | 10 days supply | Qty: 100 | Fill #0

## 2015-08-05 NOTE — ED Notes (Signed)
Patient here with vague abdiominal pain and reports frank blood in stools, also complains of cough and congestion. States just admitted for bleeding, reports ulcer in past

## 2015-08-05 NOTE — Procedures (Signed)
I was present at this session.  I have reviewed the session itself and made appropriate changes.  Hd via LUA access. Access press ok. bp in 110-120 range.  BFR 400  Okie Bogacz L 3/2/20174:58 PM

## 2015-08-05 NOTE — ED Notes (Signed)
Per Lovena Le, NT, patient stated she was leaving.  Did not want to stay.

## 2015-08-05 NOTE — Consult Note (Signed)
[facility] Renal Consultation Note    Indication for Consultation:  Management of ESRD/hemodialysis; anemia, hypertension/volume and secondary hyperparathyroidism  HPI: [name] [name] is a 60 y.o. female with ESRD, SLE, severe PCM, alcohol abuse on MWF dialysis at the [facility] who was discharged 08/02/15 following an admission precipitated by hematemesis during outpatient dialysis that resolved on its own, but she was also found to be C diff positive and was discharged on po Vancomycin.  She is profoundly malnourished. She missed dialysis yesterday.  She presented today with abdominal pain,cough, CP and has not had adequate money to purchase the oral Vancomycin because she hasn't gotten her check yet.  She denies fever, but has ongoing diarrhea, with streaks of blood at times.2 BM today.  She denies recent drinking but has some SOB at home. Labs in ED: K 3.5  WBC 5.5 hgb 9.7 Alb 1.5 total Bili 2.3 AST 65, increasing since 2/24 admission. States she had chills last night, emesis is clear. States she has tried to eat/drink broth. At present she denies abdominal pain.  Past Medical History  Diagnosis Date  . Anemia, B12 deficiency   . History of acute pancreatitis   . Right knee pain     No recent imaging on chart  . 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 Dr. Mancel Bale, [facility]  . Hypertriglyceridemia   . GERD (gastroesophageal reflux disease)   . Subdural hematoma ([place]) 02/2008    Likely 2/2 trauma from seizure from EtOH withdrawal, chronic in nature, sees Dr. Jerene Bears. Most recent CT head 10/2009 showing stable but persistent hematoma without mass effect.  . History of seizure disorder     Likely 2/2 alcohol abuse  . Hypocalcemia   . Hypomagnesemia   . Failure to thrive in childhood     Unclear etiology  . HTN (hypertension)   . Thrombocytopenia ([place])   . Hepatomegaly     On exam  .  Joint pain   . Alcohol abuse   . Vitamin D deficiency   . Pancreatitis   . Insomnia   . Hyperlipidemia   . Pernicious anemia   . Macrocytic anemia   . 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 Depression   . Fx humeral neck 04/17/2011    Transverse fracture- minimally displaced- managed as outpatient   . ABNORMAL PAP SMEAR, LGSIL 07/23/2008    Annotation: HPV positive CIN I Dr. Mancel Bale, [facility] Qualifier: Diagnosis of  By: Oretha Ellis    . Pneumonia 05/20/2012  . Arthritis     "shoulders" (08/15/2013)  . 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  Marland Kitchen Chronic diastolic CHF (congestive heart failure) ([place])   . Hypomagnesemia   . Seizures ([place])     "don't know when/why I had them; daughter was always there w/me"  . On home oxygen therapy     "2L; 24/7" (07/31/2015)  . Shortness of breath dyspnea   . Pleural effusion   . Renal insufficiency    Past Surgical History  Procedure Laterality Date  . Cesarean section  1983  . Esophagogastroduodenoscopy  07/11/2011    Procedure: ESOPHAGOGASTRODUODENOSCOPY (EGD);  Surgeon: Beryle Beams, MD;  Location: [facility];  Service: Endoscopy;  Laterality: N/A;  . Colonoscopy  07/11/2011    Procedure: COLONOSCOPY;  Surgeon: Beryle Beams, MD;  Location:  [facility] ENDOSCOPY;  Service: Endoscopy;  Laterality: N/A;  . Eye surgery Left     "trauma"  . Right colectomy  08/28/2011  . Esophagogastroduodenoscopy N/A 12/01/2012    Procedure: ESOPHAGOGASTRODUODENOSCOPY (EGD);  Surgeon: Irene Shipper, MD;  Location: [facility];  Service: Endoscopy;  Laterality: N/A;  . Colonoscopy with esophagogastroduodenoscopy (egd) Left 08/21/2013    Procedure: COLONOSCOPY WITH ESOPHAGOGASTRODUODENOSCOPY (EGD);  Surgeon: Beryle Beams, MD;  Location: [facility] ENDOSCOPY;  Service: Endoscopy;  Laterality: Left;  . Subxyphoid pericardial window N/A 12/04/2013    Procedure: SUBXYPHOID PERICARDIAL WINDOW WITH TEE;  Surgeon: Ivin Poot, MD;  Location: [place];  Service: Thoracic;  Laterality: N/A;  . Intraoperative transesophageal echocardiogram N/A 12/04/2013    Procedure: INTRAOPERATIVE TRANSESOPHAGEAL ECHOCARDIOGRAM;  Surgeon: Ivin Poot, MD;  Location: [place];  Service: Open Heart Surgery;  Laterality: N/A;  . Exchange of a dialysis catheter Left 10/22/2014    Procedure: EXCHANGE OF A DIALYSIS CATHETER;  Surgeon: Conrad [place], MD;  Location: [place];  Service: Vascular;  Laterality: Left;  . Av fistula placement Left 12/03/2014    Procedure: INSERTION OF ARTERIOVENOUS (AV) GORE-TEX GRAFT ARM;  Surgeon: Mal Misty, MD;  Location: [facility] OR;  Service: Vascular;  Laterality: Left;   Family History  Problem Relation Age of Onset  . Cancer Mother     Died from stomach cancer and "flesh eating rash  . Heart failure Father     Died in 49s from an MI  . Alcohol abuse Sister     Twin sister drinks a lot, as did both her parents and brothers  . Stroke Brother     Has 7 brothers, 1 with CVA  . Lupus Mother    Social History:  reports that she quit smoking about 4 years ago. Her smoking use included Cigarettes. She has a 20 pack-year smoking history. She has never used smokeless tobacco. She reports that she drinks alcohol. She reports that she uses illicit drugs (Marijuana and Cocaine). Allergies  Allergen Reactions  . Amitriptyline Hcl Swelling    In the face.  . Doxycycline Hyclate Itching    Feels like something crawling under her skin   Prior to Admission medications   Medication Sig Start Date End Date Taking? Authorizing Provider  albuterol (PROAIR HFA) 108 (90 BASE) MCG/ACT inhaler Inhale 1-2 puffs into the lungs every 6 (six) hours as needed for wheezing or shortness of breath. 01/14/14  Yes Jones Bales, MD  azaTHIOprine (IMURAN) 50 MG tablet Take 50 mg by mouth daily. 06/29/15  Yes Historical Provider, MD  calcium carbonate (TUMS - DOSED IN MG ELEMENTAL CALCIUM) 500 MG chewable tablet Chew 1,000 tablets by  mouth at bedtime.   Yes Historical Provider, MD  camphor-menthol Timoteo Ace) lotion Apply 1 application topically daily as needed for itching.   Yes Historical Provider, MD  cetirizine (ZYRTEC) 10 MG tablet Take 1 tablet (10 mg total) by mouth daily as needed for allergies. 06/11/15  Yes Shela Leff, MD  CREON 12000 UNITS CPEP capsule Take 1 capsule (12,000 Units total) by mouth 3 (three) times daily before meals. 01/21/15  Yes Shela Leff, MD  Darbepoetin Alfa (ARANESP) 150 MCG/0.3ML SOSY injection Inject 0.3 mLs (150 mcg total) into the vein every Wednesday with hemodialysis. 08/04/15  Yes Iline Oven, MD  FLUoxetine (PROZAC) 10 MG capsule Take 1 capsule (10 mg total) by mouth daily. For depression 01/21/15  Yes Shela Leff, MD  folic acid (FOLVITE) 1 MG tablet  Take 1 tablet (1 mg total) by mouth daily. For folic acid replacement 06/08/15  Yes Shela Leff, MD  gabapentin (NEURONTIN) 300 MG capsule Take 1 capsule (300 mg total) by mouth daily. 04/09/15 04/06/16 Yes Shela Leff, MD  hydroxychloroquine (PLAQUENIL) 200 MG tablet Take 400 mg by mouth daily.   Yes Historical Provider, MD  Lactobacillus (ACIDOPHILUS PROBIOTIC PO) Take 2 mg by mouth 3 (three) times daily. Take 2 (two) tablets by mouth three times daily   Yes Historical Provider, MD  levETIRAcetam (KEPPRA) 250 MG tablet Take 1 tablet (250 mg total) by mouth daily. Take additional 500mg  (2 tablets) after Dialysis treatments on Monday, Wednesday, Friday 07/16/15  Yes Shary Decamp, PA-C  mirtazapine (REMERON) 7.5 MG tablet Take 1 tablet (7.5 mg total) by mouth at bedtime. 01/21/15  Yes Shela Leff, MD  Multiple Vitamin (MULTIVITAMIN WITH MINERALS) TABS tablet Take 1 tablet by mouth daily. For vitamin replacement 03/24/15  Yes Shela Leff, MD  omeprazole (PRILOSEC) 40 MG capsule Take 1 capsule (40 mg total) by mouth daily. 01/21/15  Yes Shela Leff, MD  thiamine (VITAMIN B-1) 100 MG tablet Take 1 tablet (100 mg  total) by mouth daily. For low thiamine 01/21/15  Yes Shela Leff, MD  vitamin B-12 (CYANOCOBALAMIN) 250 MCG tablet Take 1 tablet (250 mcg total) by mouth every evening. 01/21/15  Yes Shela Leff, MD  nystatin (MYCOSTATIN) 100000 UNIT/ML suspension Take 5 mLs (500,000 Units total) by mouth 4 (four) times daily. Patient not taking: Reported on 08/03/2015 07/13/15   Ejiroghene Arlyce Dice, MD  vancomycin (VANCOCIN) 50 mg/mL oral solution Take 2.5 mLs (125 mg total) by mouth 4 (four) times daily. Patient not taking: Reported on 08/03/2015 08/02/15   Iline Oven, MD   Current Facility-Administered Medications  Medication Dose Route Frequency Provider Last Rate Last Dose  . acetaminophen (TYLENOL) tablet 650 mg  650 mg Oral Q6H PRN Zada Finders, MD       Or  . acetaminophen (TYLENOL) suppository 650 mg  650 mg Rectal Q6H PRN Zada Finders, MD      . ACIDOPHILUS PROBIOTIC CAPS 2 mg  2 mg Oral TID Zada Finders, MD      . azaTHIOprine (IMURAN) tablet 50 mg  50 mg Oral Daily Zada Finders, MD      . Derrill Memo ON 08/06/2015] calcitRIOL (ROCALTROL) capsule 1.25 mcg  1.25 mcg Oral Q M,W,F-HD Alric Seton, PA-C      . Darbepoetin Alfa (ARANESP) injection 200 mcg  200 mcg Intravenous Once in dialysis Alric Seton, PA-C      . Derrill Memo ON 08/11/2015] Darbepoetin Alfa (ARANESP) injection 200 mcg  200 mcg Intravenous Q Wed-HD Alric Seton, PA-C      . FLUoxetine (PROZAC) capsule 10 mg  10 mg Oral Daily Zada Finders, MD      . folic acid (FOLVITE) tablet 1 mg  1 mg Oral Daily Zada Finders, MD      . fosfomycin (MONUROL) packet 3 g  3 g Oral Once Marjan Rabbani, MD      . gabapentin (NEURONTIN) capsule 300 mg  300 mg Oral Daily Zada Finders, MD      . heparin injection 5,000 Units  5,000 Units Subcutaneous 3 times per day Zada Finders, MD      . hydroxychloroquine (PLAQUENIL) tablet 400 mg  400 mg Oral Daily Zada Finders, MD      . levETIRAcetam (KEPPRA) tablet 250 mg  250 mg Oral Daily Zada Finders, MD       . [  START ON 08/06/2015] levETIRAcetam (KEPPRA) tablet 500 mg  500 mg Oral Q M,W,F-2000 Oval Linsey, MD      . lipase/protease/amylase (CREON) capsule 12,000 Units  12,000 Units Oral TID AC Zada Finders, MD      . loratadine (CLARITIN) tablet 10 mg  10 mg Oral Daily Zada Finders, MD      . mirtazapine (REMERON) tablet 7.5 mg  7.5 mg Oral QHS Zada Finders, MD      . multivitamin with minerals tablet 1 tablet  1 tablet Oral Daily Zada Finders, MD      . ondansetron (ZOFRAN) tablet 4 mg  4 mg Oral Q6H PRN Zada Finders, MD       Or  . ondansetron (ZOFRAN) injection 4 mg  4 mg Intravenous Q6H PRN Zada Finders, MD      . pantoprazole (PROTONIX) EC tablet 40 mg  40 mg Oral Daily Zada Finders, MD      . sodium chloride 0.9 % bolus 500 mL  500 mL Intravenous Once Blanchie Dessert, MD      . sodium chloride flush (NS) 0.9 % injection 3 mL  3 mL Intravenous Q12H Zada Finders, MD      . thiamine (VITAMIN B-1) tablet 100 mg  100 mg Oral Daily Zada Finders, MD      . vancomycin (VANCOCIN) 50 mg/mL oral solution 125 mg  125 mg Oral 4 times per day Juluis Mire, MD      . vitamin B-12 (CYANOCOBALAMIN) tablet 250 mcg  250 mcg Oral QPM Zada Finders, MD       Current Outpatient Prescriptions  Medication Sig Dispense Refill  . albuterol (PROAIR HFA) 108 (90 BASE) MCG/ACT inhaler Inhale 1-2 puffs into the lungs every 6 (six) hours as needed for wheezing or shortness of breath. 1 Inhaler 1  . azaTHIOprine (IMURAN) 50 MG tablet Take 50 mg by mouth daily.    . calcium carbonate (TUMS - DOSED IN MG ELEMENTAL CALCIUM) 500 MG chewable tablet Chew 1,000 tablets by mouth at bedtime.    . camphor-menthol (SARNA) lotion Apply 1 application topically daily as needed for itching.    . cetirizine (ZYRTEC) 10 MG tablet Take 1 tablet (10 mg total) by mouth daily as needed for allergies. 30 tablet 3  . CREON 12000 UNITS CPEP capsule Take 1 capsule (12,000 Units total) by mouth 3 (three) times daily before meals. 270 capsule 3   . Darbepoetin Alfa (ARANESP) 150 MCG/0.3ML SOSY injection Inject 0.3 mLs (150 mcg total) into the vein every Wednesday with hemodialysis. 1.68 mL   . FLUoxetine (PROZAC) 10 MG capsule Take 1 capsule (10 mg total) by mouth daily. For depression 90 capsule 3  . folic acid (FOLVITE) 1 MG tablet Take 1 tablet (1 mg total) by mouth daily. For folic acid replacement 30 tablet 6  . gabapentin (NEURONTIN) 300 MG capsule Take 1 capsule (300 mg total) by mouth daily. 90 capsule 3  . hydroxychloroquine (PLAQUENIL) 200 MG tablet Take 400 mg by mouth daily.    . Lactobacillus (ACIDOPHILUS PROBIOTIC PO) Take 2 mg by mouth 3 (three) times daily. Take 2 (two) tablets by mouth three times daily    . levETIRAcetam (KEPPRA) 250 MG tablet Take 1 tablet (250 mg total) by mouth daily. Take additional 500mg  (2 tablets) after Dialysis treatments on Monday, Wednesday, Friday 30 tablet 0  . mirtazapine (REMERON) 7.5 MG tablet Take 1 tablet (7.5 mg total) by mouth at bedtime. 90 tablet 3  . Multiple Vitamin (  MULTIVITAMIN WITH MINERALS) TABS tablet Take 1 tablet by mouth daily. For vitamin replacement 90 tablet 3  . omeprazole (PRILOSEC) 40 MG capsule Take 1 capsule (40 mg total) by mouth daily. 90 capsule 3  . thiamine (VITAMIN B-1) 100 MG tablet Take 1 tablet (100 mg total) by mouth daily. For low thiamine 90 tablet 3  . vitamin B-12 (CYANOCOBALAMIN) 250 MCG tablet Take 1 tablet (250 mcg total) by mouth every evening. 90 tablet 3  . nystatin (MYCOSTATIN) 100000 UNIT/ML suspension Take 5 mLs (500,000 Units total) by mouth 4 (four) times daily. (Patient not taking: Reported on 08/03/2015) 60 mL 0  . vancomycin (VANCOCIN) 125 MG capsule Take 1 capsule (125 mg total) by mouth 4 (four) times daily. MEDICAID [id number] Jerilynn Mages ([place] [id number]) 40 capsule 0  . vancomycin (VANCOCIN) 50 mg/mL oral solution Take 2.5 mLs (125 mg total) by mouth 4 (four) times daily. (Patient not taking: Reported on 08/03/2015) 100 mL 0   Labs: Basic  Metabolic Panel:  Recent Labs Lab 08/01/15 0643 08/02/15 0433 08/05/15 1134  NA 137 136 136  K 3.8 4.1 3.5  CL 110 110 102  CO2 19* 16* 19*  GLUCOSE 136* 80 79  BUN 9 13 5*  CREATININE 3.73* 3.98* 4.80*  CALCIUM 7.1* 7.2* 7.6*  PHOS  --  1.7*  --    Liver Function Tests:  Recent Labs Lab 07/30/15 1349 07/31/15 0538 08/02/15 0433 08/05/15 1134  AST 44* 74*  --  65*  ALT 9* 9*  --  15  ALKPHOS 386* 389*  --  315*  BILITOT 0.6 1.4*  --  2.3*  PROT 6.8 6.0*  --  7.7  ALBUMIN 1.4* 1.2* 1.2* 1.5*    Recent Labs Lab 08/05/15 1134  LIPASE 17   CBC:  Recent Labs Lab 07/30/15 0955  07/31/15 0538 07/31/15 1430 08/01/15 0643 08/02/15 0433 08/05/15 1134  WBC 9.2  --  8.0 14.4* 9.5 6.2 5.5  NEUTROABS 7.6  --   --   --   --   --   --   HGB 12.0  < > 8.9* 8.3* 8.7* 8.8* 9.7*  HCT 36.7  < > 27.8* 25.0* 27.1* 27.0* 30.0*  MCV 105.8*  --  103.0* 104.6* 101.9* 101.1* 102.0*  PLT 60*  --  52* 42* 43* 39* 40*  < > = values in this interval not displayed. CBG:  Recent Labs Lab 08/05/15 1100  GLUCAP 77   Studies/Results: Dg Chest 2 View  08/05/2015  CLINICAL DATA:  Cough and fever for 2 days EXAM: CHEST  2 VIEW COMPARISON:  July 30, 2015 FINDINGS: Lungs are somewhat hyperexpanded. There is no edema or consolidation. The heart size and pulmonary vascularity are normal. No adenopathy. No bone lesions. IMPRESSION: Lungs hyperexpanded.  No edema or consolidation. Electronically Signed   By: Lowella Grip III M.D.   On: 08/05/2015 12:44    ROS: As per HPI otherwise negative. Physical Exam: Filed Vitals:   08/05/15 0958 08/05/15 1230 08/05/15 1330  BP: 111/70 120/84 53/21  Pulse: 112 110 112  Temp: 99.1 F (37.3 C)    TempSrc: Oral    Resp: 18 20 17   SpO2: 99% 98% 100%     General: thin malnourished AA F Head: Normocephalic, atraumatic, sclera non-icteric, MMM Neck: Supple. JVD not elevated.PCL Lungs: Clear bilaterally to auscultation without wheezes, rales,  or rhonchi. Breathing is unlabored.Decreased BS Heart: tachy reg Gr 2/6 SEM Abdomen: Soft, non-tender, non-distended with normoactive bowel sounds. Liver  down 5 cm M-S:  Strength and tone markedly diminished for age. Lower extremities:without edema or ischemic changes, no open wounds , skin dry ashy Neuro: Alert and oriented X 3. Moves all extremities spontaneously. Psych:  Responds to questions appropriately with a normal affect. Dialysis Access: left upper AVGG  Dialysis Orders:  [facility] MWF 3.5 hr 160 350/A 1.5 EDW 35.5 4 K 2.25 Ca no profile heparin 2000 Mircera 200 q 2 wks -to be given 3/1 calcitriol 1.25 left upper AVGG Recent labs: Hgb 8.8 at hospital d/c Fe 86 no tsat ferritin 1684 iPTH 298 Ca 7.2P 1.7 Alb 1.22  Assessment/Plan: 1. C diff positive - was discharged on 2/27 on oral Vanc- untreated since discharge - meds resumed 2. Cough - on droplet precautions - work up per primary Clear CXR 3. Recent UTI 2/24 - Urine culture > 100K colonies E Coli - asymptomatic' sensitivies pending - had 2 doses rocephin; fosfomycin ordered x 1 4. ESRD - MWF - off schedule - Cr 4.8 BUN 5 attests to severe PCM K 3.5 - use 4 K bath today 5. BP/volume BP low now in 80s w/heart rate 110  may now be able to UF volume - no weights available no overt volume on exam - left hospital above EDW and didn't got to dialysis on Wed. 6. Anemia Hgb 9.7 - continue ESA; trend Hgb - no fe; Hgb up from hospital discharge. 7. Metabolic bone disease - continue calcitriol - no binders or tums 8. Severe PCM - underweight; nutrition consult on CL 9. SLE - usual meds 10. Thrombocytopenia - holding heparin with HD- still getting SQ for DVT prophylaxis - follow; related to liver disease 10. Depression - current meds- remeron and prozac 11. Alcohol abuse/probable cirrhosis -  Total bili 2.3 increasing over the past week- work up per primary 12. Seizure disorder -keppra 13. Hx pancreatitis - on [place],  PA-C [place] [phone] 08/05/2015, 3:28 PM I have seen and examined this patient and agree with the plan of care seen,eval, examined .  Cianna Kasparian L 08/05/2015, 3:39 PM

## 2015-08-05 NOTE — H&P (Signed)
Date: 08/05/2015               Patient Name:  [name] [name] MRN: [id number]  DOB: 09-24-1955 Age / Sex: 60 y.o., female   PCP: Shela Leff, MD         Medical Service: Internal Medicine Teaching Service         Attending Physician: Dr. Oval Linsey, MD    First Contact: Dr. Zada Finders Pager: [phone]  Second Contact: Dr. 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 She was started on oral Vancomycin for 10 day total course (2/26 - 3/7), but has not taken it since discharge on 2/27 due to inability to afford. She states that she has continued diarrhea with dark black stool and one episode of bright red bloody streak in her stool. She denies any abdominal pain or cramping. She denies any nausea or vomiting. She has lightheadedness/dizziness described as a whirlwind when she sits up. She denies any falls or loss of consciousness. She has baseline SOB and wears 2L O2 via [place] continuously at home, which she says was started for heart failure, and has difficulty walking more than 15 feet (approximately distance from her living room to kitchen). Patient's last dialysis was on Monday, missed Wednesday due to not feeling well.  Patient has long history of alcohol use, and her hematemesis on last admission was attributed to likely Mallory-Weiss tears. She denies any hematemesis since discharge or alcohol use. She does report new cough since last night which is non-productive that feels like mucus stuck in her chest. She describes chest tightness at her sternum/upper abdomen that is  worse with cough. She says her husband was treated for pneumonia last week. She reports subjective fever and chills, no diaphoresis.   In the ED, vitals were: BP 111/70 mmHg  Pulse 112  Temp(Src) 99.1 F (37.3 C) (Oral)  Resp 18  SpO2 99% Lactic acid was 3.67. Hgb was 9.7 compared to 8.8 three days ago. WBC of 5.5.   Meds: Current Facility-Administered Medications  Medication Dose Route Frequency Provider Last Rate Last Dose  . acetaminophen (TYLENOL) tablet 650 mg  650 mg Oral Q6H PRN Zada Finders, MD       Or  . acetaminophen (TYLENOL) suppository 650 mg  650 mg Rectal Q6H PRN Zada Finders, MD      . ACIDOPHILUS PROBIOTIC CAPS 2 mg  2 mg Oral TID Zada Finders, MD      . azaTHIOprine (IMURAN) tablet 50 mg  50 mg Oral Daily Zada Finders, MD      . Derrill Memo ON 08/06/2015] calcitRIOL (ROCALTROL) capsule 1.25 mcg  1.25 mcg Oral Q M,W,F-HD Alric Seton, PA-C      . Darbepoetin Alfa (ARANESP) injection 200 mcg  200 mcg Intravenous Once in dialysis Alric Seton, PA-C      . Derrill Memo ON 08/11/2015] Darbepoetin Alfa (ARANESP) injection 200 mcg  200 mcg Intravenous Q Wed-HD Alric Seton, PA-C      . dextromethorphan-guaiFENesin [facility] DM) 30-600 MG per 12 hr tablet 1 tablet  1 tablet Oral BID  Zada Finders, MD      . FLUoxetine (PROZAC) capsule 10 mg  10 mg Oral Daily Zada Finders, MD      . folic acid (FOLVITE) tablet 1 mg  1 mg Oral Daily Zada Finders, MD      . fosfomycin (MONUROL) packet 3 g  3 g Oral Once Marjan Rabbani, MD      . gabapentin (NEURONTIN) capsule 300 mg  300 mg Oral Daily Zada Finders, MD      . hydroxychloroquine (PLAQUENIL) tablet 400 mg  400 mg Oral Daily Zada Finders, MD      . levETIRAcetam (KEPPRA) tablet 250 mg  250 mg Oral Daily Zada Finders, MD      . Derrill Memo ON 08/06/2015] levETIRAcetam (KEPPRA) tablet 500 mg  500 mg Oral Q M,W,F-2000 Oval Linsey, MD      . lipase/protease/amylase (CREON) capsule 12,000 Units  12,000 Units Oral TID AC Zada Finders, MD      .  loratadine (CLARITIN) tablet 10 mg  10 mg Oral Daily Zada Finders, MD      . mirtazapine (REMERON) tablet 7.5 mg  7.5 mg Oral QHS Zada Finders, MD      . multivitamin with minerals tablet 1 tablet  1 tablet Oral Daily Zada Finders, MD      . ondansetron (ZOFRAN) tablet 4 mg  4 mg Oral Q6H PRN Zada Finders, MD       Or  . ondansetron (ZOFRAN) injection 4 mg  4 mg Intravenous Q6H PRN Zada Finders, MD      . pantoprazole (PROTONIX) EC tablet 40 mg  40 mg Oral Daily Zada Finders, MD      . sodium chloride 0.9 % bolus 500 mL  500 mL Intravenous Once Blanchie Dessert, MD 500 mL/hr at 08/05/15 1558 500 mL at 08/05/15 1558  . sodium chloride flush (NS) 0.9 % injection 3 mL  3 mL Intravenous Q12H Zada Finders, MD      . thiamine (VITAMIN B-1) tablet 100 mg  100 mg Oral Daily Zada Finders, MD      . vancomycin (VANCOCIN) 50 mg/mL oral solution 125 mg  125 mg Oral 4 times per day Juluis Mire, MD      . vitamin B-12 (CYANOCOBALAMIN) tablet 250 mcg  250 mcg Oral QPM Zada Finders, MD       Current Outpatient Prescriptions  Medication Sig Dispense Refill  . albuterol (PROAIR HFA) 108 (90 BASE) MCG/ACT inhaler Inhale 1-2 puffs into the lungs every 6 (six) hours as needed for wheezing or shortness of breath. 1 Inhaler 1  . azaTHIOprine (IMURAN) 50 MG tablet Take 50 mg by mouth daily.    . calcium carbonate (TUMS - DOSED IN MG ELEMENTAL CALCIUM) 500 MG chewable tablet Chew 1,000 tablets by mouth at bedtime.    . camphor-menthol (SARNA) lotion Apply 1 application topically daily as needed for itching.    . cetirizine (ZYRTEC) 10 MG tablet Take 1 tablet (10 mg total) by mouth daily as needed for allergies. 30 tablet 3  . CREON 12000 UNITS CPEP capsule Take 1 capsule (12,000 Units total) by mouth 3 (three) times daily before meals. 270 capsule 3  . Darbepoetin Alfa (ARANESP) 150 MCG/0.3ML SOSY injection Inject 0.3 mLs (150 mcg total) into the vein every Wednesday with hemodialysis. 1.68 mL   . FLUoxetine  (PROZAC) 10 MG capsule Take 1 capsule (10 mg total) by mouth daily. For depression 90 capsule 3  . folic acid (FOLVITE) 1 MG tablet  Take 1 tablet (1 mg total) by mouth daily. For folic acid replacement 30 tablet 6  . gabapentin (NEURONTIN) 300 MG capsule Take 1 capsule (300 mg total) by mouth daily. 90 capsule 3  . hydroxychloroquine (PLAQUENIL) 200 MG tablet Take 400 mg by mouth daily.    . Lactobacillus (ACIDOPHILUS PROBIOTIC PO) Take 2 mg by mouth 3 (three) times daily. Take 2 (two) tablets by mouth three times daily    . levETIRAcetam (KEPPRA) 250 MG tablet Take 1 tablet (250 mg total) by mouth daily. Take additional 500mg  (2 tablets) after Dialysis treatments on Monday, Wednesday, Friday 30 tablet 0  . mirtazapine (REMERON) 7.5 MG tablet Take 1 tablet (7.5 mg total) by mouth at bedtime. 90 tablet 3  . Multiple Vitamin (MULTIVITAMIN WITH MINERALS) TABS tablet Take 1 tablet by mouth daily. For vitamin replacement 90 tablet 3  . omeprazole (PRILOSEC) 40 MG capsule Take 1 capsule (40 mg total) by mouth daily. 90 capsule 3  . thiamine (VITAMIN B-1) 100 MG tablet Take 1 tablet (100 mg total) by mouth daily. For low thiamine 90 tablet 3  . vitamin B-12 (CYANOCOBALAMIN) 250 MCG tablet Take 1 tablet (250 mcg total) by mouth every evening. 90 tablet 3  . nystatin (MYCOSTATIN) 100000 UNIT/ML suspension Take 5 mLs (500,000 Units total) by mouth 4 (four) times daily. (Patient not taking: Reported on 08/03/2015) 60 mL 0  . vancomycin (VANCOCIN) 125 MG capsule Take 1 capsule (125 mg total) by mouth 4 (four) times daily. MEDICAID [id number] Jerilynn Mages ([place] [id number]) 40 capsule 0  . vancomycin (VANCOCIN) 50 mg/mL oral solution Take 2.5 mLs (125 mg total) by mouth 4 (four) times daily. (Patient not taking: Reported on 08/03/2015) 100 mL 0    Allergies: Allergies as of 08/05/2015 - Review Complete 08/05/2015  Allergen Reaction Noted  . Amitriptyline hcl Swelling   . Doxycycline hyclate Itching 11/10/2010   Past  Medical History  Diagnosis Date  . Anemia, B12 deficiency   . History of acute pancreatitis   . Right knee pain     No recent imaging on chart  . 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 Dr. Mancel Bale, [facility]  . Hypertriglyceridemia   . GERD (gastroesophageal reflux disease)   . Subdural hematoma ([place]) 02/2008    Likely 2/2 trauma from seizure from EtOH withdrawal, chronic in nature, sees Dr. Jerene Bears. Most recent CT head 10/2009 showing stable but persistent hematoma without mass effect.  . History of seizure disorder     Likely 2/2 alcohol abuse  . Hypocalcemia   . Hypomagnesemia   . Failure to thrive in childhood     Unclear etiology  . HTN (hypertension)   . Thrombocytopenia ([place])   . Hepatomegaly     On exam  . Joint pain   . Alcohol abuse   . Vitamin D deficiency   . Pancreatitis   . Insomnia   . Hyperlipidemia   . Pernicious anemia   . Macrocytic anemia   . 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 Depression   . Fx humeral neck 04/17/2011    Transverse fracture- minimally displaced- managed as outpatient   . ABNORMAL PAP SMEAR, LGSIL 07/23/2008    Annotation: HPV positive CIN I Dr. Mancel Bale, [facility] Qualifier: Diagnosis of  By: Oretha Ellis    . Pneumonia 05/20/2012  . Arthritis     "shoulders" (08/15/2013)  . CKD (  chronic kidney disease), stage III     a. Due to biopsy proven FSGS.  Marland Kitchen Chronic diastolic CHF (congestive heart failure) ([place])   . Hypomagnesemia   . Seizures ([place])     "don't know when/why I had them; daughter was always there w/me"  . On home oxygen therapy     "2L; 24/7" (07/31/2015)  . Shortness of breath dyspnea   . Pleural effusion   . Renal insufficiency    Past Surgical History  Procedure Laterality Date  . Cesarean section  1983  . Esophagogastroduodenoscopy  07/11/2011    Procedure: ESOPHAGOGASTRODUODENOSCOPY (EGD);  Surgeon: Beryle Beams, MD;   Location: [facility];  Service: Endoscopy;  Laterality: N/A;  . Colonoscopy  07/11/2011    Procedure: COLONOSCOPY;  Surgeon: Beryle Beams, MD;  Location: [facility];  Service: Endoscopy;  Laterality: N/A;  . Eye surgery Left     "trauma"  . Right colectomy  08/28/2011  . Esophagogastroduodenoscopy N/A 12/01/2012    Procedure: ESOPHAGOGASTRODUODENOSCOPY (EGD);  Surgeon: Irene Shipper, MD;  Location: [facility];  Service: Endoscopy;  Laterality: N/A;  . Colonoscopy with esophagogastroduodenoscopy (egd) Left 08/21/2013    Procedure: COLONOSCOPY WITH ESOPHAGOGASTRODUODENOSCOPY (EGD);  Surgeon: Beryle Beams, MD;  Location: [facility] ENDOSCOPY;  Service: Endoscopy;  Laterality: Left;  . Subxyphoid pericardial window N/A 12/04/2013    Procedure: SUBXYPHOID PERICARDIAL WINDOW WITH TEE;  Surgeon: Ivin Poot, MD;  Location: [place];  Service: Thoracic;  Laterality: N/A;  . Intraoperative transesophageal echocardiogram N/A 12/04/2013    Procedure: INTRAOPERATIVE TRANSESOPHAGEAL ECHOCARDIOGRAM;  Surgeon: Ivin Poot, MD;  Location: [place];  Service: Open Heart Surgery;  Laterality: N/A;  . Exchange of a dialysis catheter Left 10/22/2014    Procedure: EXCHANGE OF A DIALYSIS CATHETER;  Surgeon: Conrad Lassen, MD;  Location: [place];  Service: Vascular;  Laterality: Left;  . Av fistula placement Left 12/03/2014    Procedure: INSERTION OF ARTERIOVENOUS (AV) GORE-TEX GRAFT ARM;  Surgeon: Mal Misty, MD;  Location: [facility] OR;  Service: Vascular;  Laterality: Left;   Family History  Problem Relation Age of Onset  . Cancer Mother     Died from stomach cancer and "flesh eating rash  . Heart failure Father     Died in 66s from an MI  . Alcohol abuse Sister     Twin sister drinks a lot, as did both her parents and brothers  . Stroke Brother     Has 7 brothers, 1 with CVA  . Lupus Mother    Social History   Social History  . Marital Status: Divorced    Spouse Name: N/A  . Number of Children: N/A  . Years of  Education: N/A   Occupational History  . Not on file.   Social History Main Topics  . Smoking status: Former Smoker -- 0.50 packs/day for 40 years    Types: Cigarettes    Quit date: 09/20/2010  . Smokeless tobacco: Never Used  . Alcohol Use: 0.0 oz/week    0 Standard drinks or equivalent per week     Comment: 12/02/14 "graduated from [facility] (for alcohol abuse) in October 2015" ; 07/31/2015 "in alcohol rehab now"  . Drug Use: Yes    Special: Marijuana, Cocaine     Comment: 12/02/14 "last drug use was in 2012"  . Sexual Activity: Not Currently   Other Topics Concern  . Not on file   Social History Narrative   Lives with her  significant other and 2 grandchildren. 1 child   Has 7 brothers and 4 sisters, 1 twin sister.   Unemployed, worked in [employer].    Abuses alcohol-drinks 1 glass of wine daily    No drug use. Former cigarette use quit 1.5 years ago.     11 th grade education             Review of Systems: Review of Systems  Constitutional: Positive for fever and chills.       Subjective fever  HENT: Positive for congestion.   Respiratory: Positive for cough and shortness of breath. Negative for hemoptysis, sputum production and wheezing.   Cardiovascular: Positive for chest pain. Negative for palpitations, orthopnea, leg swelling and PND.  Gastrointestinal: Positive for diarrhea, blood in stool and melena. Negative for heartburn, nausea, vomiting, abdominal pain and constipation.  Genitourinary: Negative for dysuria and hematuria.  Musculoskeletal: Negative for myalgias, back pain, joint pain and falls.  Neurological: Positive for dizziness, weakness and headaches. Negative for tingling, sensory change, focal weakness and loss of consciousness.  Psychiatric/Behavioral: Negative for substance abuse.     Physical Exam: Blood pressure 108/54, pulse 101, temperature 98.7 F (37.1 C), temperature source Oral, resp. rate 22, weight 83 lb 5.3 oz (37.8 kg), SpO2  99 %. Physical Exam  Constitutional: She is oriented to person, place, and time. No distress.  Thin, cachectic, chronically ill appearing woman laying in bed  HENT:  Head: Normocephalic and atraumatic.  Eyes: EOM are normal.  Neck: No tracheal deviation present.  Cardiovascular: Regular rhythm and intact distal pulses.  Tachycardia present.   Systolic murmur heard best at RUS border  Pulmonary/Chest:  Decreased air movement, no wheezing, rales, rhonchi.  Abdominal: Soft. There is no tenderness.  Hypoactive bowel sounds  Musculoskeletal: She exhibits no edema or tenderness.  ROM normal, weak throughout.  Neurological: She is alert and oriented to person, place, and time.  Skin: Skin is warm and dry. She is not diaphoretic.  Psychiatric: She has a normal mood and affect.     Lab results: Basic Metabolic Panel:  Recent Labs  08/05/15 1134  NA 136  K 3.5  CL 102  CO2 19*  GLUCOSE 79  BUN 5*  CREATININE 4.80*  CALCIUM 7.6*   Liver Function Tests:  Recent Labs  08/05/15 1134  AST 65*  ALT 15  ALKPHOS 315*  BILITOT 2.3*  PROT 7.7  ALBUMIN 1.5*    Recent Labs  08/05/15 1134  LIPASE 17   No results for input(s): AMMONIA in the last 72 hours. CBC:  Recent Labs  08/05/15 1134  WBC 5.5  HGB 9.7*  HCT 30.0*  MCV 102.0*  PLT 40*   Cardiac Enzymes: No results for input(s): CKTOTAL, CKMB, CKMBINDEX, TROPONINI in the last 72 hours. BNP: No results for input(s): PROBNP in the last 72 hours. D-Dimer: No results for input(s): DDIMER in the last 72 hours. CBG:  Recent Labs  08/05/15 1100  GLUCAP 77   Hemoglobin A1C: No results for input(s): HGBA1C in the last 72 hours. Fasting Lipid Panel: No results for input(s): CHOL, HDL, LDLCALC, TRIG, CHOLHDL, LDLDIRECT in the last 72 hours. Thyroid Function Tests: No results for input(s): TSH, T4TOTAL, FREET4, T3FREE, THYROIDAB in the last 72 hours. Anemia Panel: No results for input(s): VITAMINB12, FOLATE,  FERRITIN, TIBC, IRON, RETICCTPCT in the last 72 hours. Coagulation:  Recent Labs  08/05/15 1447  LABPROT 44.5*  INR 4.93*   Urine Drug Screen: Drugs of Abuse  Component Value Date/Time   LABOPIA NONE DETECTED 07/16/2015 1400   LABOPIA NEG 09/18/2011 0936   COCAINSCRNUR NONE DETECTED 07/16/2015 1400   COCAINSCRNUR NEG 09/18/2011 0936   LABBENZ NONE DETECTED 07/16/2015 1400   LABBENZ NEG 09/18/2011 0936   LABBENZ NEG 04/10/2011 1130   AMPHETMU NONE DETECTED 07/16/2015 1400   AMPHETMU NEG 09/18/2011 0936   AMPHETMU NEG 04/10/2011 1130   THCU NONE DETECTED 07/16/2015 1400   THCU NEG 09/18/2011 0936   LABBARB NONE DETECTED 07/16/2015 1400   LABBARB NEG 09/18/2011 0936    Alcohol Level: No results for input(s): ETH in the last 72 hours. Urinalysis: No results for input(s): COLORURINE, LABSPEC, PHURINE, GLUCOSEU, HGBUR, BILIRUBINUR, KETONESUR, PROTEINUR, UROBILINOGEN, NITRITE, LEUKOCYTESUR in the last 72 hours.  Invalid input(s): APPERANCEUR   Imaging results:  Dg Chest 2 View  08/05/2015  CLINICAL DATA:  Cough and fever for 2 days EXAM: CHEST  2 VIEW COMPARISON:  July 30, 2015 FINDINGS: Lungs are somewhat hyperexpanded. There is no edema or consolidation. The heart size and pulmonary vascularity are normal. No adenopathy. No bone lesions. IMPRESSION: Lungs hyperexpanded.  No edema or consolidation. Electronically Signed   By: Lowella Grip III M.D.   On: 08/05/2015 12:44    Other results: EKG: sinus tachycardia, q waves V2-V3.  Assessment & Plan by Problem: Principal Problem:   C. 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  C difficile diarrhea: C diff positive antigen and toxin on 07/31/15. Patient started on oral Vancomycin on last admission, but she was unable to afford the medication on an outpatient basis and has not taken since discharge. She has continued diarrhea with one episode of bright red blood in stool and recent melenic stool. She denies any abdominal pain. She is afebrile and without leukocytosis, however she is immunosuppressed. Her Albumin is 1.5 and she is ESRD which can be indications for treatment with Vancomycin for initial episode of severe C diff. Infection. We discussed with Dr. Maudie Mercury, our pharmacist, who will work to provide patient with oral Vancomycin for outpatient use. -Continue oral Vancomycin 125 mg for 10 days -IVF hydration as needed, careful to avoid volume overload in setting of heart failure and ESRD -Encourage oral hydration  Viral URI: Patient with non-productive cough since last night, chest congestion, and sternal chest tightness that is worse with cough. CXR is without consolidation or infiltrative process. Troponin negative and EKG without acute ischemic changes. She likely has a viral URI/bronchitis which we will treat with supportive care. -f/u Influenza -Mucinex DM  ESRD 2/2  SLE FSGS on HD MWF: Missed Wednesday session. Nephrology consulted and following. -HD per Nephrology -Imuran 50 mg daily -Plaquenil 400 mg daily  ESBL UTI: ESBL E. Coli on 2/24, received Fosfomycin and was to have 2nd Fosfomycin dose today. Patient asymptomatic, but on immunosuppressants. -Give Fosfomycin once today -f/u sensitivity for fosfomycin  Thrombocytopenia: Chronically low platelet count, (70-140). Platelets 40K today compared to 39K on discharge. -Consider transfusion at <30 with bleeding  Chronic pancreatitis and alcohol use: Denies alcohol use since  discharge from hospital.   -Creon -Thiamine, folate, B12  Severe Protein-Calorie Malnutrition: -Remeron  Seizure disorder: -Keppra 250 mg qd, 500 mg MWF after dialysis  Diet: clears  DVT ppx: SCDs  Code: FULL   Dispo: Disposition is deferred at this time, awaiting improvement of current medical problems. Anticipated discharge in approximately 1-2 day(s).   The patient does have a current PCP Shela Leff, MD) and does need an [facility] hospital follow-up appointment after discharge.  The patient does not have transportation limitations that hinder transportation to clinic appointments.  Signed: Zada Finders, MD 08/05/2015, 4:42 PM

## 2015-08-05 NOTE — Progress Notes (Signed)
ED CM attempted to speak with pt for Cm consult - need medication assistance  Cm spoke with Marlou Sa Pt is receiving dialysis at this time and for admission  Pending unit Cm interventions

## 2015-08-05 NOTE — ED Provider Notes (Signed)
CSN: [id number]     Arrival date & time 08/05/15  [id number] History   First MD Initiated Contact with Patient 08/05/15 1025     Chief Complaint  Patient presents with  . Abdominal Pain     (Consider location/radiation/quality/duration/timing/severity/associated sxs/prior Treatment) HPI Comments: Patient is a 60 year old female with a history of end-stage renal disease on dialysis Monday Wednesday Friday, recent hospitalization for C. difficile currently supposed to be taking oral vancomycin but since hospital discharge on the 27th has not been able to afford the prescription at home, pneumonia, hypertension, thrombocytopenia and hematemesis due to cirrhosis and alcohol abuse presents today with chest pain, cough and generalized body aches. Patient states the cough and central chest pain started yesterday. She denies fever but last night was having body aches. That is why she missed dialysis yesterday. She also complains of ongoing diarrhea which is getting worse. Occasionally her stool have streaks of bright red blood in it but she denies any large amounts of blood or blood clots. She denies any abdominal pain or vomiting at this time. Patient was given a go to dialysis today but she still felt poorly and decided to come here. She states she will occasionally feel short of breath at home but she has home oxygen and that resolves her symptoms. She denies any alcohol use in the last several days  The history is provided by the patient.    Past Medical History  Diagnosis Date  . Anemia, B12 deficiency   . History of acute pancreatitis   . Right knee pain     No recent imaging on chart  . 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 Dr. Mancel Bale, [facility]  . Hypertriglyceridemia   . GERD (gastroesophageal reflux disease)   . Subdural hematoma ([place]) 02/2008    Likely 2/2 trauma from seizure from EtOH withdrawal, chronic in nature, sees Dr.  Jerene Bears. Most recent CT head 10/2009 showing stable but persistent hematoma without mass effect.  . History of seizure disorder     Likely 2/2 alcohol abuse  . Hypocalcemia   . Hypomagnesemia   . Failure to thrive in childhood     Unclear etiology  . HTN (hypertension)   . Thrombocytopenia ([place])   . Hepatomegaly     On exam  . Joint pain   . Alcohol abuse   . Vitamin D deficiency   . Pancreatitis   . Insomnia   . Hyperlipidemia   . Pernicious anemia   . Macrocytic anemia   . 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 Depression   . Fx humeral neck 04/17/2011    Transverse fracture- minimally displaced- managed as outpatient   . ABNORMAL PAP SMEAR, LGSIL 07/23/2008    Annotation: HPV positive CIN I Dr. Mancel Bale, [facility] Qualifier: Diagnosis of  By: Oretha Ellis    . Pneumonia 05/20/2012  . Arthritis     "shoulders" (08/15/2013)  . 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  Marland Kitchen Chronic diastolic CHF (congestive heart failure) ([place])   . Hypomagnesemia   . Seizures ([place])     "don't know when/why I had them; daughter was always there w/me"  . On home oxygen therapy     "2L; 24/7" (07/31/2015)  . Shortness of breath dyspnea   . Pleural effusion   . Renal insufficiency    Past  Surgical History  Procedure Laterality Date  . Cesarean section  1983  . Esophagogastroduodenoscopy  07/11/2011    Procedure: ESOPHAGOGASTRODUODENOSCOPY (EGD);  Surgeon: Beryle Beams, MD;  Location: [facility];  Service: Endoscopy;  Laterality: N/A;  . Colonoscopy  07/11/2011    Procedure: COLONOSCOPY;  Surgeon: Beryle Beams, MD;  Location: [facility];  Service: Endoscopy;  Laterality: N/A;  . Eye surgery Left     "trauma"  . Right colectomy  08/28/2011  . Esophagogastroduodenoscopy N/A 12/01/2012    Procedure: ESOPHAGOGASTRODUODENOSCOPY (EGD);  Surgeon: Irene Shipper, MD;  Location: [facility];  Service: Endoscopy;  Laterality: N/A;  .  Colonoscopy with esophagogastroduodenoscopy (egd) Left 08/21/2013    Procedure: COLONOSCOPY WITH ESOPHAGOGASTRODUODENOSCOPY (EGD);  Surgeon: Beryle Beams, MD;  Location[facility];  Service: Endoscopy;  Laterality: Left;  . Subxyphoid pericardial window N/A 12/04/2013    Procedure: SUBXYPHOID PERICARDIAL WINDOW WITH TEE;  Surgeon: Ivin Poot, MD;  Location: [place];  Service: Thoracic;  Laterality: N/A;  . Intraoperative transesophageal echocardiogram N/A 12/04/2013    Procedure: INTRAOPERATIVE TRANSESOPHAGEAL ECHOCARDIOGRAM;  Surgeon: Ivin Poot, MD;  Location: [place];  Service: Open Heart Surgery;  Laterality: N/A;  . Exchange of a dialysis catheter Left 10/22/2014    Procedure: EXCHANGE OF A DIALYSIS CATHETER;  Surgeon: Conrad North Hudson, MD;  Location: [place];  Service: Vascular;  Laterality: Left;  . Av fistula placement Left 12/03/2014    Procedure: INSERTION OF ARTERIOVENOUS (AV) GORE-TEX GRAFT ARM;  Surgeon: Mal Misty, MD;  Location: [facility] OR;  Service: Vascular;  Laterality: Left;   Family History  Problem Relation Age of Onset  . Cancer Mother     Died from stomach cancer and "flesh eating rash  . Heart failure Father     Died in 67s from an MI  . Alcohol abuse Sister     Twin sister drinks a lot, as did both her parents and brothers  . Stroke Brother     Has 7 brothers, 1 with CVA  . Lupus Mother    Social History  Substance Use Topics  . Smoking status: Former Smoker -- 0.50 packs/day for 40 years    Types: Cigarettes    Quit date: 09/20/2010  . Smokeless tobacco: Never Used  . Alcohol Use: 0.0 oz/week    0 Standard drinks or equivalent per week     Comment: 12/02/14 "graduated from [place] (for alcohol abuse) in October 2015" ; 07/31/2015 "in alcohol rehab now"   OB History    No data available     Review of Systems  All other systems reviewed and are negative.     Allergies  Amitriptyline hcl and Doxycycline hyclate  Home Medications   Prior  to Admission medications   Medication Sig Start Date End Date Taking? Authorizing Provider  albuterol (PROAIR HFA) 108 (90 BASE) MCG/ACT inhaler Inhale 1-2 puffs into the lungs every 6 (six) hours as needed for wheezing or shortness of breath. Patient not taking: Reported on 08/03/2015 01/14/14   Jones Bales, MD  azaTHIOprine (IMURAN) 50 MG tablet Take 50 mg by mouth daily. 06/29/15   Historical Provider, MD  calcium carbonate (TUMS - DOSED IN MG ELEMENTAL CALCIUM) 500 MG chewable tablet Chew 1,000 tablets by mouth at bedtime.    Historical Provider, MD  camphor-menthol Timoteo Ace) lotion Apply 1 application topically daily as needed for itching.    Historical Provider, MD  cetirizine (ZYRTEC) 10 MG tablet Take 1  tablet (10 mg total) by mouth daily as needed for allergies. 06/11/15   Shela Leff, MD  CREON 12000 UNITS CPEP capsule Take 1 capsule (12,000 Units total) by mouth 3 (three) times daily before meals. 01/21/15   Shela Leff, MD  Darbepoetin Alfa (ARANESP) 150 MCG/0.3ML SOSY injection Inject 0.3 mLs (150 mcg total) into the vein every Wednesday with hemodialysis. Patient not taking: Reported on 08/03/2015 08/04/15   Iline Oven, MD  FLUoxetine (PROZAC) 10 MG capsule Take 1 capsule (10 mg total) by mouth daily. For depression 01/21/15   Shela Leff, MD  folic acid (FOLVITE) 1 MG tablet Take 1 tablet (1 mg total) by mouth daily. For folic acid replacement 06/08/15   Shela Leff, MD  fosfomycin (MONUROL) 3 g PACK Take 3 g by mouth once. Take on August 05, 2015. Patient not taking: Reported on 08/03/2015 08/02/15   Iline Oven, MD  gabapentin (NEURONTIN) 300 MG capsule Take 1 capsule (300 mg total) by mouth daily. 04/09/15 04/06/16  Shela Leff, MD  hydroxychloroquine (PLAQUENIL) 200 MG tablet Take 400 mg by mouth daily.    Historical Provider, MD  Lactobacillus (ACIDOPHILUS PROBIOTIC PO) Take 2 mg by mouth 3 (three) times daily. Take 2 (two) tablets by mouth three times  daily    Historical Provider, MD  levETIRAcetam (KEPPRA) 250 MG tablet Take 1 tablet (250 mg total) by mouth daily. Take additional 500mg  (2 tablets) after Dialysis treatments on Monday, Wednesday, Friday 07/16/15   Shary Decamp, PA-C  mirtazapine (REMERON) 7.5 MG tablet Take 1 tablet (7.5 mg total) by mouth at bedtime. 01/21/15   Shela Leff, MD  Multiple Vitamin (MULTIVITAMIN WITH MINERALS) TABS tablet Take 1 tablet by mouth daily. For vitamin replacement 03/24/15   Shela Leff, MD  nystatin (MYCOSTATIN) 100000 UNIT/ML suspension Take 5 mLs (500,000 Units total) by mouth 4 (four) times daily. Patient not taking: Reported on 08/03/2015 07/13/15   Ejiroghene Arlyce Dice, MD  omeprazole (PRILOSEC) 40 MG capsule Take 1 capsule (40 mg total) by mouth daily. 01/21/15   Shela Leff, MD  thiamine (VITAMIN B-1) 100 MG tablet Take 1 tablet (100 mg total) by mouth daily. For low thiamine 01/21/15   Shela Leff, MD  vancomycin (VANCOCIN) 50 mg/mL oral solution Take 2.5 mLs (125 mg total) by mouth 4 (four) times daily. Patient not taking: Reported on 08/03/2015 08/02/15   Iline Oven, MD  vitamin B-12 (CYANOCOBALAMIN) 250 MCG tablet Take 1 tablet (250 mcg total) by mouth every evening. 01/21/15   Shela Leff, MD   BP 111/70 mmHg  Pulse 112  Temp(Src) 99.1 F (37.3 C) (Oral)  Resp 18  SpO2 99% Physical Exam  Constitutional: She is oriented to person, place, and time. She appears well-developed. She appears cachectic. No distress.  HENT:  Head: Normocephalic and atraumatic.  Nose: Mucosal edema present.  Mouth/Throat: Oropharynx is clear and moist. No posterior oropharyngeal edema or posterior oropharyngeal erythema.  Dry mucous membranes  Eyes: Conjunctivae and EOM are normal. Pupils are equal, round, and reactive to light.  Neck: Normal range of motion. Neck supple.  Cardiovascular: Regular rhythm and intact distal pulses.  Tachycardia present.   No murmur  heard. Pulmonary/Chest: Effort normal. No accessory muscle usage. No respiratory distress. She has no wheezes. She has rhonchi in the left lower field. She has no rales.  Abdominal: Soft. She exhibits no distension. There is no tenderness. There is no rebound and no guarding.  Musculoskeletal: Normal range of motion. She exhibits no  edema or tenderness.  Neurological: She is alert and oriented to person, place, and time.  Skin: Skin is warm and dry. No rash noted. No erythema.  Skin is dry with decreased turgor  Psychiatric: She has a normal mood and affect. Her behavior is normal.  Nursing note and vitals reviewed.   ED Course  Procedures (including critical care time) Labs Review Labs Reviewed  COMPREHENSIVE METABOLIC PANEL - Abnormal; Notable for the following:    CO2 19 (*)    BUN 5 (*)    Creatinine, Ser 4.80 (*)    Calcium 7.6 (*)    Albumin 1.5 (*)    AST 65 (*)    Alkaline Phosphatase 315 (*)    Total Bilirubin 2.3 (*)    GFR calc non Af Amer 9 (*)    GFR calc Af Amer 11 (*)    All other components within normal limits  CBC - Abnormal; Notable for the following:    RBC 2.94 (*)    Hemoglobin 9.7 (*)    HCT 30.0 (*)    MCV 102.0 (*)    RDW 17.0 (*)    Platelets 40 (*)    All other components within normal limits  I-STAT CG4 LACTIC ACID, ED - Abnormal; Notable for the following:    Lactic Acid, Venous 3.76 (*)    All other components within normal limits  LIPASE, BLOOD  INFLUENZA PANEL BY PCR (TYPE A & B, H1N1)  I-STAT TROPOININ, ED  CBG MONITORING, ED  POC OCCULT BLOOD, ED    Imaging Review Dg Chest 2 View  08/05/2015  CLINICAL DATA:  Cough and fever for 2 days EXAM: CHEST  2 VIEW COMPARISON:  July 30, 2015 FINDINGS: Lungs are somewhat hyperexpanded. There is no edema or consolidation. The heart size and pulmonary vascularity are normal. No adenopathy. No bone lesions. IMPRESSION: Lungs hyperexpanded.  No edema or consolidation. Electronically Signed   By:  Lowella Grip III M.D.   On: 08/05/2015 12:44   I have personally reviewed and evaluated these images and lab results as part of my medical decision-making.   EKG Interpretation   Date/Time:  Thursday August 05 2015 11:04:35 EST Ventricular Rate:  106 PR Interval:  181 QRS Duration: 77 QT Interval:  353 QTC Calculation: 469 R Axis:   108 Text Interpretation:  Sinus tachycardia Anterolateral infarct, age  indeterminate No significant change since last tracing Confirmed by  Maryan Rued  MD, Loree Fee ([id number]) on 08/05/2015 11:19:16 AM      MDM   Final diagnoses:  Flu  Dehydration  C. difficile colitis    Patient is a 60 year old female with multiple medical problems including end-stage renal disease on dialysis who was recently admitted to the hospital for C. difficile infection on oral vancomycin. Patient was discharged 3 days ago and since being home she has been unable to fill the vancomycin because it costs $69 and she did not have that kind of money. She last dialyzed on Monday and states that she did not go yesterday because she felt terrible. She complained of new onset cough starting yesterday in general body aches. She states her husband was recently diagnosed with pneumonia. She has noted ongoing diarrhea since going home and it's starting to get worse. Occasional streaks of blood in her stool but no frank or massive bleeding. She denies any vomiting or abdominal pain at this time. She denies any alcohol use.  On exam patient is tachycardic and has a temperature of 99.1.  Blood pressure and oxygen saturations are within normal limits. Patient has rhonchi in the left lower lobe which is concerning for potential pneumonia. Also concern for ongoing C. difficile that has not been fully treated. She was supposed to finish her course of oral vancomycin on 08/10/2015.  Lower suspicion that patient's chest pain and cough is related to ACS however EKG was done that showed sinus tachycardia  without acute changes.  CBC, CMP, lipase, lactic acid, troponin, chest x-ray pending  1:16 PM Patient's chest x-ray without signs of pneumonia at this time however dehydration with a lactic acid of 3.67. Potassium is normal at this time. Patient does appear to hydrated and there is no evidence of fluid overload at this time. She will be given IV fluids. Given her ongoing diarrhea and flulike symptoms and inability to afford the vancomycin at home discussed with internal medicine who will come and see the patient. Flu PCR ordered.  Blanchie Dessert, MD 08/05/15 1351

## 2015-08-06 ENCOUNTER — Other Ambulatory Visit: Payer: Self-pay | Admitting: *Deleted

## 2015-08-06 ENCOUNTER — Other Ambulatory Visit: Payer: Self-pay

## 2015-08-06 ENCOUNTER — Encounter: Payer: Self-pay | Admitting: Pharmacist

## 2015-08-06 DIAGNOSIS — N186 End stage renal disease: Secondary | ICD-10-CM

## 2015-08-06 DIAGNOSIS — M3214 Glomerular disease in systemic lupus erythematosus: Secondary | ICD-10-CM | POA: Diagnosis not present

## 2015-08-06 DIAGNOSIS — J101 Influenza due to other identified influenza virus with other respiratory manifestations: Secondary | ICD-10-CM | POA: Diagnosis not present

## 2015-08-06 DIAGNOSIS — R05 Cough: Secondary | ICD-10-CM | POA: Diagnosis not present

## 2015-08-06 DIAGNOSIS — B9620 Unspecified Escherichia coli [E. coli] as the cause of diseases classified elsewhere: Secondary | ICD-10-CM

## 2015-08-06 DIAGNOSIS — Z992 Dependence on renal dialysis: Secondary | ICD-10-CM

## 2015-08-06 DIAGNOSIS — A047 Enterocolitis due to Clostridium difficile: Secondary | ICD-10-CM

## 2015-08-06 DIAGNOSIS — D696 Thrombocytopenia, unspecified: Secondary | ICD-10-CM

## 2015-08-06 DIAGNOSIS — R791 Abnormal coagulation profile: Secondary | ICD-10-CM

## 2015-08-06 DIAGNOSIS — N390 Urinary tract infection, site not specified: Secondary | ICD-10-CM

## 2015-08-06 DIAGNOSIS — I950 Idiopathic hypotension: Secondary | ICD-10-CM | POA: Diagnosis not present

## 2015-08-06 LAB — URINE CULTURE

## 2015-08-06 LAB — PROTIME-INR
INR: 6.74 (ref 0.00–1.49)
PROTHROMBIN TIME: 56.3 s — AB (ref 11.6–15.2)

## 2015-08-06 LAB — RENAL FUNCTION PANEL
Albumin: 1.5 g/dL — ABNORMAL LOW (ref 3.5–5.0)
Anion gap: 16 — ABNORMAL HIGH (ref 5–15)
BUN: <5 mg/dL — ABNORMAL LOW (ref 6–20)
CO2: 24 mmol/L (ref 22–32)
Calcium: 7.2 mg/dL — ABNORMAL LOW (ref 8.9–10.3)
Chloride: 98 mmol/L — ABNORMAL LOW (ref 101–111)
Creatinine, Ser: 1.61 mg/dL — ABNORMAL HIGH (ref 0.44–1.00)
GFR calc Af Amer: 40 mL/min — ABNORMAL LOW (ref >60)
GFR calc non Af Amer: 34 mL/min — ABNORMAL LOW (ref >60)
Glucose, Bld: 143 mg/dL — ABNORMAL HIGH (ref 65–99)
Phosphorus: 1.2 mg/dL — ABNORMAL LOW (ref 2.5–4.6)
Potassium: 4 mmol/L (ref 3.5–5.1)
Sodium: 138 mmol/L (ref 135–145)

## 2015-08-06 LAB — INFLUENZA PANEL BY PCR (TYPE A & B)
H1N1 flu by pcr: NOT DETECTED
INFLBPCR: NEGATIVE
Influenza A By PCR: POSITIVE — AB

## 2015-08-06 LAB — HEPATIC FUNCTION PANEL
ALBUMIN: 1.3 g/dL — AB (ref 3.5–5.0)
ALT: 12 U/L — ABNORMAL LOW (ref 14–54)
AST: 60 U/L — ABNORMAL HIGH (ref 15–41)
Alkaline Phosphatase: 240 U/L — ABNORMAL HIGH (ref 38–126)
BILIRUBIN INDIRECT: 0.7 mg/dL (ref 0.3–0.9)
Bilirubin, Direct: 0.8 mg/dL — ABNORMAL HIGH (ref 0.1–0.5)
TOTAL PROTEIN: 6.4 g/dL — AB (ref 6.5–8.1)
Total Bilirubin: 1.5 mg/dL — ABNORMAL HIGH (ref 0.3–1.2)

## 2015-08-06 LAB — CBC
HCT: 22.7 % — ABNORMAL LOW (ref 36.0–46.0)
Hemoglobin: 7.4 g/dL — ABNORMAL LOW (ref 12.0–15.0)
MCH: 34.1 pg — ABNORMAL HIGH (ref 26.0–34.0)
MCHC: 32.6 g/dL (ref 30.0–36.0)
MCV: 104.6 fL — ABNORMAL HIGH (ref 78.0–100.0)
Platelets: 38 10*3/uL — ABNORMAL LOW (ref 150–400)
RBC: 2.17 MIL/uL — ABNORMAL LOW (ref 3.87–5.11)
RDW: 16.7 % — ABNORMAL HIGH (ref 11.5–15.5)
WBC: 4.3 10*3/uL (ref 4.0–10.5)

## 2015-08-06 LAB — VITAMIN B12: Vitamin B-12: 2539 pg/mL — ABNORMAL HIGH (ref 180–914)

## 2015-08-06 MED ORDER — OSELTAMIVIR PHOSPHATE 30 MG PO CAPS: 30.0000 mg | ORAL_CAPSULE | Freq: Every day | ORAL | Status: DC

## 2015-08-06 MED ORDER — K PHOS MONO-SOD PHOS DI & MONO 155-852-130 MG PO TABS
250.0000 mg | ORAL_TABLET | Freq: Two times a day (BID) | ORAL | Status: DC
  Administered 2015-08-06 – 2015-08-07 (×2): 250 mg via ORAL
  Filled 2015-08-06 (×5): qty 1

## 2015-08-06 MED ORDER — VITAMIN K1 1 MG/0.5ML IJ SOLN
1.0000 mg | Freq: Once | INTRAMUSCULAR | Status: AC
  Administered 2015-08-06: 1 mg via SUBCUTANEOUS
  Filled 2015-08-06: qty 0.5

## 2015-08-06 MED ORDER — OSELTAMIVIR PHOSPHATE 30 MG PO CAPS
30.0000 mg | ORAL_CAPSULE | Freq: Every day | ORAL | Status: DC
  Administered 2015-08-06 – 2015-08-07 (×2): 30 mg via ORAL
  Filled 2015-08-06 (×3): qty 1

## 2015-08-06 MED ORDER — OSELTAMIVIR PHOSPHATE 30 MG PO CAPS
30.0000 mg | ORAL_CAPSULE | Freq: Once | ORAL | Status: DC
  Filled 2015-08-06: qty 1

## 2015-08-06 MED ORDER — BOOST / RESOURCE BREEZE PO LIQD
1.0000 | Freq: Three times a day (TID) | ORAL | Status: DC
  Administered 2015-08-06: 1 via ORAL

## 2015-08-06 MED ORDER — DEXTROSE 5 % IV SOLN
5.0000 mg | Freq: Once | INTRAVENOUS | Status: DC
  Filled 2015-08-06: qty 0.5

## 2015-08-06 MED ORDER — ENSURE ENLIVE PO LIQD
237.0000 mL | Freq: Three times a day (TID) | ORAL | Status: DC
  Administered 2015-08-06 – 2015-08-07 (×2): 237 mL via ORAL

## 2015-08-06 MED FILL — TAMIFLU 6 MG/ML SUSPENSION: 6 | 5 days supply | Qty: 60 | Fill #0

## 2015-08-06 NOTE — H&P (Signed)
Internal Medicine Attending Admission Note Date: 08/06/2015  Patient name: CHAYNE ZANIEWSKI Medical record number: [id number] Date of birth: 02-25-1956 Age: 60 y.o. Gender: female  I saw and evaluated the patient. I reviewed the resident's note and I agree with the resident's findings and plan as documented in the resident's note.  Chief Complaint(s): Persistent diarrhea, body aches, cough.  History - key components related to admission:  Ms. 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 difficile colitis. She was discharged on oral vancomycin but could not afford it and therefore has not been taking it for the last 5 days. As a result, her diarrhea has persisted. She has also developed a nonproductive cough and diffuse body aches since discharge. She presented to the emergency department with the above complaints and was admitted to the internal medicine teaching service for further evaluation and care. She has had orthostatic lightheadedness, a recent sick contact in her husband who was recently treated for pneumonia, subjective fevers, and chills. She denies any syncope, nausea, vomiting, chest pain or abdominal paor  Since admission the vancomycin was restarted and she underwent hemodialysis. When seen on rounds the morning after admission she noted improvement in her symptoms overall and felt stable for discharge home. Nephrology feels she requires another off cycle hemodialysis run tomorrow morning.   Physical Exam - key components related to admission:  Filed Vitals:   08/06/15 0400 08/06/15 0408 08/06/15 0500 08/06/15 0840  BP: 98/61  92/58 107/65  Pulse:    87  Temp: 100.2 F (37.9 C)  98.8 F (37.1 C) 98.8 F (37.1 C)  TempSrc:  Oral  Oral Oral  Resp: 25  19 15   Height:      Weight:  81 lb 9.1 oz (37 kg)    SpO2: 97%   99%   Gen.: Well-developed, thin, woman lying comfortably in bed in no acute distress. Abdomen: Soft, nontender, active bowel sounds.  Lab results:  Basic Metabolic Panel:  Recent Labs  08/05/15 1134 08/05/15 2343  NA 136 138  K 3.5 4.0  CL 102 98*  CO2 19* 24  GLUCOSE 79 143*  BUN 5* <5*  CREATININE 4.80* 1.61*  CALCIUM 7.6* 7.2*  PHOS  --  1.2*   Liver Function Tests:  Recent Labs  08/05/15 1134 08/05/15 2343 08/06/15 0547  AST 65*  --  60*  ALT 15  --  12*  ALKPHOS 315*  --  240*  BILITOT 2.3*  --  1.5*  PROT 7.7  --  6.4*  ALBUMIN 1.5* 1.5* 1.3*    Recent Labs  08/05/15 1134  LIPASE 17   CBC:  Recent Labs  08/05/15 2342 08/06/15 0547  WBC 4.2 4.3  HGB 9.0* 7.4*  HCT 28.6* 22.7*  MCV 104.4* 104.6*  PLT 46* 38*   CBG:  Recent Labs  08/05/15 1100  GLUCAP 77   Anemia Panel:  Recent Labs  08/05/15 2342  VITAMINB12 2539*   Coagulation:  Recent Labs  08/05/15 1447 08/06/15 0547  INR 4.93* 6.74*   Misc. Labs:  Lactic acid 3.76 Influenza A positive Influenza B and H1N1 flu negative by PCR   Imaging results:  Dg Chest 2 View  08/05/2015  CLINICAL DATA:  Cough and fever for 2 days EXAM: CHEST  2 VIEW COMPARISON:  July 30, 2015 FINDINGS: Lungs are somewhat hyperexpanded. There is no edema or consolidation. The heart size and pulmonary vascularity are normal. No adenopathy. No bone lesions. IMPRESSION: Lungs hyperexpanded.  No edema or consolidation. Electronically Signed   By: Lowella Grip III M.D.   On: 08/05/2015 12:44   I personally reviewed the CXR and feel she has a small left basilar lateral subsegment infiltrate.  Other results:  EKG: Normal sinus tachycardia at 106 bpm, slight right axis deviation, normal intervals, deep septal Q waves in V1 through V3, no LVH by voltage, good R wave progression, no ST or T-wave changes.  Other then the slight right axis deviation there is no significant change from the previous ECG from 07/31/2015.  Assesssment & Plan by Problem:  Ms. 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 difficile colitis. She was discharged on oral vancomycin but could not afford it and therefore has not been taking it for the last 5 days. As a result, her diarrhea has persisted likely related to incompletely treated C. difficile colitis. She also has diffuse body aches and a nonproductive cough and was found to be influenza A positive which likely explains the symptoms.  1) C. difficile colitis: We have restarted the oral vancomycin and have made arrangements to provide it to her so she can complete the course. At this point, from a GI standpoint she is stable for discharge.  2) Influenza A infection: She will be started on Tamiflu with dose adjustment for hemodialysis.  3) End-stage renal disease: She will undergo an off cycle hemodialysis run tomorrow morning and then restart her Monday, Wednesday, Friday schedule thereafter.  4) Disposition: She will be discharged home tomorrow after her hemodialysis session to complete the course of oral vancomycin. Follow-up will be in the [facility] in her hemodialysis unit.

## 2015-08-06 NOTE — Progress Notes (Signed)
CRITICAL VALUE ALERT  Critical value received:  INR 6.74   Date of notification:  08/06/2015   Time of notification:  0709  Critical value read back:Yes.    Nurse who received alert:  Reap, Jon Gills   MD notified (1st page):  IMTS  Time of first page:  7:45 AM   MD notified (2nd page):  Time of second page:  Responding MD:   Time MD responded:

## 2015-08-06 NOTE — Progress Notes (Signed)
Occupational Therapy Discharge Patient Details Name: [name] [name] MRN: [id number] DOB: 1955/07/20 Today's Date: 08/06/2015 Time:  -     Patient discharged from OT services secondary to baseline MOD I ( no acute Ot needs) See PT evaluatoin.  Please see latest therapy progress note for current level of functioning and progress toward goals.    Progress and discharge plan discussed with patient and/or caregiver: Patient/Caregiver agrees with plan  GO     Vonita Moss   OTR/L Pager: [phone] Office: [phone] .  08/06/2015, 10:33 AM

## 2015-08-06 NOTE — Progress Notes (Addendum)
[name], female   DOB: Nov 23, 1955, 60 y.o.   MRN: [id number]  Vancomycin and oseltamivir were provided from [facility] and reviewed with the patient, including name, instructions, indication, goals of therapy, potential side effects, importance of adherence, and safe use.  Patient verbalized understanding by repeating back information and was advised to contact me if further medication-related questions arise.   Spoke to daughter, [name], who states she is on the way to visit patient in ~20 minutes. Paticia Stack to pick up the medication to store in refrigerator at home since anticipated discharge is tomorrow. [name] verbalized understanding by repeat.

## 2015-08-06 NOTE — Progress Notes (Signed)
Admit: 08/05/2015 LOS:   63F ESRD MWF [place] admit with diarrhea, cough, fever; Influenza A postive, ongoing CDI; chronic failure tho thrive; chronic alcohol abuse  Subjective:  Flu positive on oseltamivir Low grade fever overnight HD yesterday,  2L UF, post weight 36.6kg  03/02 0701 - 03/03 0700 In: 243 [P.O.:240; I.V.:3] Out: 2000   Filed Weights   08/05/15 1625 08/05/15 2229 08/06/15 0408  Weight: 37.8 kg (83 lb 5.3 oz) 36.6 kg (80 lb 11 oz) 37 kg (81 lb 9.1 oz)    Scheduled Meds: . azaTHIOprine  50 mg Oral Daily  . calcitRIOL  1.25 mcg Oral Q M,W,F-HD  . [START ON 08/11/2015] darbepoetin (ARANESP) injection - DIALYSIS  200 mcg Intravenous Q Wed-HD  . dextromethorphan-guaiFENesin  1 tablet Oral BID  . feeding supplement  1 Container Oral TID BM  . FLUoxetine  10 mg Oral Daily  . folic acid  1 mg Oral Daily  . gabapentin  300 mg Oral Daily  . hydroxychloroquine  400 mg Oral Daily  . lactobacillus acidophilus & bulgar  2 tablet Oral TID WC  . levETIRAcetam  250 mg Oral Daily  . levETIRAcetam  500 mg Oral Q M,W,F-2000  . lipase/protease/amylase  12,000 Units Oral TID AC  . loratadine  10 mg Oral Daily  . mirtazapine  7.5 mg Oral QHS  . multivitamin with minerals  1 tablet Oral Daily  . oseltamivir  30 mg Oral Daily  . pantoprazole  40 mg Oral Daily  . phytonadione (VITAMIN K) IV  5 mg Intravenous Once  . sodium chloride flush  3 mL Intravenous Q12H  . thiamine  100 mg Oral Daily  . vancomycin  125 mg Oral 4 times per day  . vitamin B-12  250 mcg Oral QPM   Continuous Infusions:  PRN Meds:.sodium chloride, sodium chloride, sodium chloride, sodium chloride, acetaminophen **OR** acetaminophen, alteplase, alteplase, heparin, heparin, lidocaine (PF), lidocaine-prilocaine, ondansetron **OR** ondansetron (ZOFRAN) IV, pentafluoroprop-tetrafluoroeth  Current Labs: reviewed    Physical Exam:  Blood pressure 107/65, pulse 87, temperature 98.8 F (37.1 C), temperature source Oral,  resp. rate 15, height 5\' 1"  (1.549 m), weight 37 kg (81 lb 9.1 oz), SpO2 99 %. Chronically ill appearing, NAD CTAB RRR No LEE Nonfocal EOMI NCAT  Dialysis Orders: [facility] M[facility] 3.5 hr 160 350/A 1.5 EDW 35.5 4 K 2.25 Ca no profile heparin 2000 Mircera 200 q 2 wks -to be given 3/1 calcitriol 1.25 left upper AVGG Recent labs: Hgb 8.8 at hospital d/c Fe 86 no tsat ferritin 1684 iPTH 298 Ca 7.2P 1.7 Alb 1.22  A 1. Influenza A positive on oseltamivir 2. Ongoing CDI, not able to purchase outpt PO vancomycin 3. ESRD MWF [place] [facility] 4. Chronic FTT / PCM 5. Alcoholism 6. SLE 7. Cirrhosis 8. 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 Seizure disorder 10. Hypophosphatemia, chronic; not on binders 11. Anemia of Chronic Illness and ESRD on ESA    P 1. Discussed goals of care briefly, she wishes to cont current medical approach; still very reasonable to involve palliative care 2. HD tomorrow of schedule, back on schedule Monday 3. Start low dose phos replacement KPhos 250 BID, watch K 4. Watch Hb, can transfuse with HD if < 7.3   Pearson Grippe MD 08/06/2015, 9:07 AM   Recent Labs Lab 08/02/15 0433 08/05/15 1134 08/05/15 2343  NA 136 136 138  K 4.1 3.5 4.0  CL 110 102 98*  CO2 16* 19* 24  GLUCOSE 80 79 143*  BUN 13 5* <5*  CREATININE 3.98* 4.80* 1.61*  CALCIUM 7.2* 7.6* 7.2*  PHOS 1.7*  --  1.2*    Recent Labs Lab 07/30/15 0955  08/05/15 1134 08/05/15 2342 08/06/15 0547  WBC 9.2  < > 5.5 4.2 4.3  NEUTROABS 7.6  --   --   --   --   HGB 12.0  < > 9.7* 9.0* 7.4*  HCT 36.7  < > 30.0* 28.6* 22.7*  MCV 105.8*  < > 102.0* 104.4* 104.6*  PLT 60*  < > 40* 46* 38*  < > = values in this interval not displayed.

## 2015-08-06 NOTE — Progress Notes (Signed)
Initial Nutrition Assessment  DOCUMENTATION CODES:   Severe malnutrition in context of chronic illness, Underweight  INTERVENTION:   Ensure Enlive po TID, each supplement provides 350 kcal and 20 grams of protein  NUTRITION DIAGNOSIS:   Malnutrition related to chronic illness as evidenced by estimated needs  GOAL:   Patient will meet greater than or equal to 90% of their needs  MONITOR:   PO intake, Supplement acceptance, Labs, Weight trends, I & O's  REASON FOR ASSESSMENT:   Consult, Malnutrition Screening Tool Assessment of nutrition requirement/status  ASSESSMENT:   89F ESRD MWF [facility] admit with diarrhea, cough, fever; Influenza A postive, ongoing CDI; chronic failure tho thrive; chronic alcohol abuse.  RD unable to obtain nutrition hx at time of visit. Seen per Clinical Nutrition January 2017. Dx with severe malnutrition which is ongoing. Per previous RD note, pt dislikes [place] [employer] and prefers Ensure [place]. RD to order during hospitalization. Weight has been stable per readings below.  Nutrition-Focused physical exam completed 07/01/15. Findings were severe fat depletion, severe muscle depletion, and no edema.   Diet Order:  Diet renal/carb modified with fluid restriction Diet-HS Snack?: Nothing; Room service appropriate?: Yes; Fluid consistency:: Thin  Skin:  Reviewed, no issues  Last BM:  3/2  Height:   Ht Readings from Last 1 Encounters:  08/05/15 5\' 1"  (1.549 m)    Weight:   Wt Readings from Last 1 Encounters:  08/06/15 81 lb 9.1 oz (37 kg)    Wt Readings from Last 10 Encounters:  08/06/15 81 lb 9.1 oz (37 kg)  08/05/15 84 lb (38.102 kg)  08/02/15 83 lb 8.9 oz (37.9 kg)  07/13/15 83 lb 3.2 oz (37.739 kg)  07/05/15 79 lb 12.9 oz (36.2 kg)  05/10/15 80 lb 11 oz (36.6 kg)  04/21/15 87 lb 11.9 oz (39.8 kg)  03/02/15 79 lb 6.4 oz (36.016 kg)  02/11/15 79 lb 8 oz (36.061 kg)  01/21/15 78 lb 4.8 oz (35.517 kg)    Ideal Body Weight:  47.7  kg  BMI:  Body mass index is 15.42 kg/(m^2).  Estimated Nutritional Needs:   Kcal:  1200-1400  Protein:  60-70 gm  Fluid:  >/= 1.5 L  EDUCATION NEEDS:   No education needs identified at this time  Arthur Holms, RD, LDN Pager #: [phone] After-Hours Pager #: [phone]

## 2015-08-06 NOTE — Progress Notes (Signed)
   Subjective: Patient feels well today. She says she has continued diarrhea 1-2 times a day, but no bright red blood since admission. She has continued non-productive cough. She feels at her baseline. Objective: Vital signs in last 24 hours: Filed Vitals:   08/06/15 0400 08/06/15 0408 08/06/15 0500 08/06/15 0840  BP: 98/61  92/58 107/65  Pulse:    87  Temp: 100.2 F (37.9 C)  98.8 F (37.1 C) 98.8 F (37.1 C)  TempSrc: Oral  Oral Oral  Resp: 25  19 15   Height:      Weight:  81 lb 9.1 oz (37 kg)    SpO2: 97%   99%   Weight change:   Intake/Output Summary (Last 24 hours) at 08/06/15 1042 Last data filed at 08/06/15 [id number]  Gross per 24 hour  Intake    246 ml  Output   2000 ml  Net  -1754 ml   General: cachectic woman, resting in bed, no acute distress Cardiac: RRR, no rubs, murmurs or gallops Pulm: mild crackles left lower lung field, otherwise diminished breath sounds but clear Abd: soft, nontender, nondistended, BS present Ext: thin extremities, no pedal edema Neuro: alert and oriented X3, cranial nerves II-XII grossly intact  Assessment/Plan: Principal Problem:   C. difficile diarrhea Active Problems:   Alcohol abuse   Depression   GERD   Seizure disorder (HCC)   Anemia of chronic disease   Coagulopathy (HCC)   ESRD (end stage renal disease) on dialysis (HCC)   Infection due to ESBL-producing Escherichia coli   Influenza A  C difficile diarrhea: C diff positive antigen and toxin on 07/31/15. Patient started on oral Vancomycin on last admission, but she was unable to afford the medication on an outpatient basis. We discussed with Dr. Maudie Mercury, our pharmacist, who will work to provide patient with oral Vancomycin for outpatient use. -Continue oral Vancomycin 125 mg for 10 total days  -Encourage oral hydration  Influenza A: Start Tamiflu renal dosing and continue supportive care. 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 -HD per Nephrology -Imuran 50 mg daily -Plaquenil 400 mg daily  Elevated INR/Thrombocytopenia: Chronically low platelet count, (70-140). Platelets 38K this morning. INR has trended up to 6.74 today. -Vitamin K 5 mg IV once -Consider transfusion at <30 with bleeding -f/u PT/INR -Monitor for bleeding  ESBL UTI: ESBL E. Coli on 2/24, received Fosfomycin and was to have 2nd Fosfomycin dose on 3/2. Patient asymptomatic, but on immunosuppressants. -Given Fosfomycin once on 3/2 -f/u Urine Cx   Dispo:  Anticipated discharge tomorrow after off-schedule dialysis with medications. Close f/u in [facility].      Zada Finders, MD 08/06/2015, 10:42 AM

## 2015-08-06 NOTE — Progress Notes (Signed)
TOC help with vancomycin and oseltamivir access.

## 2015-08-06 NOTE — Evaluation (Signed)
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 Pt currently with functional limitations due to the deficits listed below (see PT Problem List). Pt said that she does not think that she needs physical therapy but agrees that her balance is poor. Pt was able to ambulate with no AD but had slight unsteadiness. PT encouraged pt to use her cane at all times at home. Pt will benefit from skilled PT to increase their independence and safety with mobility to allow discharge to the venue listed below.  Pt states that her husband is home all the time to help her if she needs it. Pt's balance is poor and would benefit from HHPT safety eval, but pt may refuse. Pt is at baseline and has no acute PT needs, acute PT is signing off at this time. Pt independent/supervision with toileting/self care and does not need acute OT, notified acute OT to cancel consult. Pt is okay to ambulate with nursing if desired.     Follow Up Recommendations Home health PT;Supervision - Intermittent    Equipment Recommendations  None recommended by PT    Recommendations for Other Services       Precautions / Restrictions Precautions Precautions: Fall Restrictions Weight Bearing Restrictions: No      Mobility  Bed Mobility Overal bed mobility: Modified Independent             General bed mobility comments: Does not need physical assistance, just needs increased time.   Transfers Overall transfer level: Needs assistance Equipment used: None Transfers: Sit to/from Stand Sit to Stand: Supervision         General transfer comment: Supervision for safety due to  unsteadyness.   Ambulation/Gait Ambulation/Gait assistance: Supervision Ambulation Distance (Feet): 100 Feet Assistive device: None Gait Pattern/deviations: Step-through pattern;Decreased stride length;Staggering left;Staggering right;Narrow base of support Gait velocity: decreased Gait velocity interpretation: Below normal speed for age/gender General Gait Details: Pt able to walk without AD but has slight unsteadyness and seeks UE support if close to wall/furniture. Pt states that she occasionally uses a cane at home.   Stairs            Wheelchair Mobility    Modified Rankin (Stroke Patients Only)       Balance Overall balance assessment: Needs assistance Sitting-balance support: No upper extremity supported;Feet unsupported Sitting balance-Leahy Scale: Normal     Standing balance support: No upper extremity supported Standing balance-Leahy Scale: Fair Standing balance comment: seeks UE support occasionally.              High level balance activites: Turns;Sudden stops High Level Balance Comments: Slight unsteadiness with turns.              Pertinent Vitals/Pain Pain Assessment: No/denies pain  Vitals stable throughout session on room air.     Home Living Family/patient expects to be discharged to:: Private residence Living Arrangements: Children;Spouse/significant other Available Help at Discharge: Available 24 hours/day;Family Type of Home: House Home Access: Stairs to enter Entrance Stairs-Rails: Can reach both Entrance Stairs-Number of Steps: 4 Home Layout: One level Lawn - single point;Grab bars - tub/shower;Shower seat Additional Comments: home O2--4L    Prior Function Level  of Independence: Independent         Comments: doesn't drive but will go with daughter to grocery store and run errhands     Hand Dominance   Dominant Hand: Right    Extremity/Trunk Assessment   Upper Extremity Assessment: Generalized  weakness           Lower Extremity Assessment: Generalized weakness      Cervical / Trunk Assessment: Normal  Communication   Communication: No difficulties  Cognition Arousal/Alertness: Awake/alert Behavior During Therapy: WFL for tasks assessed/performed Overall Cognitive Status: Within Functional Limits for tasks assessed                      General Comments General comments (skin integrity, edema, etc.): Helped pt get cleaned up and also walked to the bathroom, pt had another bowel movement and was able to clean herself up, and put on new briefs independently.     Exercises        Assessment/Plan    PT Assessment Patient needs continued PT services  PT Diagnosis Difficulty walking;Abnormality of gait;Generalized weakness   PT Problem List Decreased strength;Decreased activity tolerance;Decreased balance;Decreased safety awareness  PT Treatment Interventions Gait training;Stair training;Functional mobility training;Therapeutic exercise;Therapeutic activities;Balance training;Patient/family education   PT Goals (Current goals can be found in the Care Plan section) Acute Rehab PT Goals Patient Stated Goal: To go home.  PT Goal Formulation: With patient Time For Goal Achievement: 08/20/15 Potential to Achieve Goals: Good    Frequency Min 3X/week   Barriers to discharge        Co-evaluation               End of Session Equipment Utilized During Treatment: Gait belt Activity Tolerance: Patient tolerated treatment well Patient left: in bed;with call bell/phone within reach;with bed alarm set;with SCD's reapplied Nurse Communication: Mobility status    Functional Assessment Tool Used: clinical judgment Functional Limitation: Mobility: Walking and moving around Mobility: Walking and Moving Around Current Status [id number]): At least 1 percent but less than 20 percent impaired, limited or restricted Mobility: Walking and Moving Around Goal Status  [phone]): At least 1 percent but less than 20 percent impaired, limited or restricted Mobility: Walking and Moving Around Discharge Status [phone]): At least 1 percent but less than 20 percent impaired, limited or restricted    Time: [id number] PT Time Calculation (min) (ACUTE ONLY): 40 min   Charges:   PT Evaluation $PT Eval Moderate Complexity: 1 Procedure PT Treatments $Gait Training: 8-22 mins $Therapeutic Activity: 8-22 mins   PT G Codes:   PT G-Codes **NOT FOR INPATIENT CLASS** Functional Assessment Tool Used: clinical judgment Functional Limitation: Mobility: Walking and moving around Mobility: Walking and Moving Around Current Status [id number]): At least 1 percent but less than 20 percent impaired, limited or restricted Mobility: Walking and Moving Around Goal Status [phone]): At least 1 percent but less than 20 percent impaired, limited or restricted Mobility: Walking and Moving Around Discharge Status [phone]): At least 1 percent but less than 20 percent impaired, limited or restricted   Colon Branch, SPT Colon Branch 08/06/2015, 10:08 AM

## 2015-08-06 NOTE — Patient Outreach (Signed)
I received notification that [name] was not able to afford her Vancomycin.  She was readmitted into the hospital for C. Diff and flu.  I contact [name] [name], PharmD and told her that [name] qualifies for the Pharmacy Emergency Fund.  [name] [name], PharmD will have a prescription sent to the pharmacy and will make sure [name] has it when she is discharge from the hospital.  I am more than happy to assist in any other pharmacy issues that may arise in the future.   [name] [name], PharmD, [place] [phone]

## 2015-08-06 NOTE — Progress Notes (Signed)
[name] [name] is a 60 y.o. female patient who transferred  from [facility] awake, alert  & orientated  X 3, Full Code, VSS - Blood pressure 123/73, pulse 95, temperature 98.5 F (36.9 C), temperature source Oral, resp. rate 16, height 5\' 1"  (1.549 m), weight 40 kg (88 lb 2.9 oz), SpO2 100 %.,R/A, no c/o shortness of breath, no c/o chest pain, no distress noted. Non tele.   No IV access.  Allergies:   Allergies  Allergen Reactions  . Amitriptyline Hcl Swelling    In the face.  . Doxycycline Hyclate Itching    Feels like something crawling under her skin     Past Medical History  Diagnosis Date  . Anemia, B12 deficiency   . History of acute pancreatitis   . Right knee pain     No recent imaging on chart  . 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 Dr. Mancel Bale, [facility]  . Hypertriglyceridemia   . GERD (gastroesophageal reflux disease)   . Subdural hematoma ([place]) 02/2008    Likely 2/2 trauma from seizure from EtOH withdrawal, chronic in nature, sees Dr. Jerene Bears. Most recent CT head 10/2009 showing stable but persistent hematoma without mass effect.  . History of seizure disorder     Likely 2/2 alcohol abuse  . Hypocalcemia   . Hypomagnesemia   . Failure to thrive in childhood     Unclear etiology  . HTN (hypertension)   . Thrombocytopenia ([place])   . Hepatomegaly     On exam  . Joint pain   . Alcohol abuse   . Vitamin D deficiency   . Pancreatitis   . Insomnia   . Hyperlipidemia   . Pernicious anemia   . Macrocytic anemia   . 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 Depression   . Fx humeral neck 04/17/2011    Transverse fracture- minimally displaced- managed as outpatient   . ABNORMAL PAP SMEAR, LGSIL 07/23/2008    Annotation: HPV positive CIN I Dr. Mancel Bale, [facility] Qualifier: Diagnosis of  By: Oretha Ellis    . Pneumonia 05/20/2012  . Arthritis     "shoulders" (08/15/2013)  . CKD  (chronic kidney disease), stage III     a. Due to biopsy proven FSGS.  Marland Kitchen Chronic diastolic CHF (congestive heart failure) ([place])   . Hypomagnesemia   . Seizures ([place])     "don't know when/why I had them; daughter was always there w/me"  . On home oxygen therapy     "2L; 24/7" (07/31/2015)  . Shortness of breath dyspnea   . Pleural effusion   . Renal insufficiency     Pt orientation to unit, room and routine. SR up x 2, fall risk assessment complete with Patient and family verbalizing understanding of risks associated with falls. Pt verbalizes an understanding of how to use the call bell and to call for help before getting out of bed.  Skin, clean-dry- intact without evidence of bruising, or skin tears.   No evidence of skin break down noted on exam. no rashes    Will cont to monitor and assist as needed.  Late entry Lindalou Hose, RN 08/06/2015 8:00 PM

## 2015-08-06 NOTE — Patient Outreach (Signed)
Member noted to have readmitted to hospital yesterday for flu, dehydration, and C. Diff.  Hospital liaison and [facility] pharmacist notified.  Will monitor for discharge, restart transition of care program once discharged.  [name], BSN, [employer]  [facility] Care Manager [phone]

## 2015-08-06 NOTE — Discharge Summary (Signed)
Name: [name] [name] MRN: [id number] DOB: [birth year] 60 y.o. PCP: Shela Leff, MD  Date of Admission: 08/05/2015 10:24 AM Date of Discharge: 08/07/2015 Attending Physician: Oval Linsey, MD  Discharge Diagnosis: 1. C difficile diarrhea 2. Influenza A 3. ESRD (end stage renal disease) on dialysis 4. Systemic Lupus Erythematosus 5. Coagulopathy 6. Anemia of chronic disease 7. Asymptomatic bacteruria  Discharge Medications:   Medication List    STOP taking these medications        nystatin 100000 UNIT/ML suspension  Commonly known as:  MYCOSTATIN      TAKE these medications        ACIDOPHILUS PROBIOTIC PO  Take 2 mg by mouth 3 (three) times daily. Take 2 (two) tablets by mouth three times daily     albuterol 108 (90 Base) MCG/ACT inhaler  Commonly known as:  PROAIR HFA  Inhale 1-2 puffs into the lungs every 6 (six) hours as needed for wheezing or shortness of breath.     azaTHIOprine 50 MG tablet  Commonly known as:  IMURAN  Take 50 mg by mouth daily.     calcium carbonate 500 MG chewable tablet  Commonly known as:  TUMS - dosed in mg elemental calcium  Chew 1,000 tablets by mouth at bedtime.     camphor-menthol lotion  Commonly known as:  SARNA  Apply 1 application topically daily as needed for itching.     cetirizine 10 MG tablet  Commonly known as:  ZYRTEC  Take 1 tablet (10 mg total) by mouth daily as needed for allergies.     CREON 12000 units Cpep capsule  Generic drug:  lipase/protease/amylase  Take 1 capsule (12,000 Units total) by mouth 3 (three) times daily before meals.     Darbepoetin Alfa 150 MCG/0.3ML Sosy injection  Commonly known as:  ARANESP  Inject 0.3 mLs (150 mcg total) into the vein every Wednesday with hemodialysis.     FLUoxetine 10 MG capsule  Commonly known as:  PROZAC  Take 1 capsule (10 mg total) by mouth daily. For depression     folic acid 1 MG tablet  Commonly known as:  FOLVITE  Take 1 tablet (1 mg total) by mouth  daily. For folic acid replacement     gabapentin 300 MG capsule  Commonly known as:  NEURONTIN  Take 1 capsule (300 mg total) by mouth daily.     hydroxychloroquine 200 MG tablet  Commonly known as:  PLAQUENIL  Take 400 mg by mouth daily.     levETIRAcetam 250 MG tablet  Commonly known as:  KEPPRA  Take 1 tablet (250 mg total) by mouth daily. Take additional 500mg  (2 tablets) after Dialysis treatments on Monday, Wednesday, Friday     mirtazapine 7.5 MG tablet  Commonly known as:  REMERON  Take 1 tablet (7.5 mg total) by mouth at bedtime.     multivitamin with minerals Tabs tablet  Take 1 tablet by mouth daily. For vitamin replacement     omeprazole 40 MG capsule  Commonly known as:  PRILOSEC  Take 1 capsule (40 mg total) by mouth daily.     oseltamivir 30 MG capsule  Commonly known as:  TAMIFLU  Take 1 capsule (30 mg total) by mouth daily.     thiamine 100 MG tablet  Commonly known as:  VITAMIN B-1  Take 1 tablet (100 mg total) by mouth daily. For low thiamine     vancomycin 50 mg/mL oral solution  Commonly known as:  [employer]  Take 2.5 mLs (125 mg total) by mouth 4 (four) times daily.     vitamin B-12 250 MCG tablet  Commonly known as:  CYANOCOBALAMIN  Take 1 tablet (250 mcg total) by mouth every evening.        Disposition and follow-up:   [name] [name] was discharged from [facility] in Stable condition.  At the hospital follow up visit please address:  1.  Dialysis adherence, medication adherence. Please ensure she has not developed symptoms of UTI.  2.  Labs / imaging needed at time of follow-up: BMP, CBC, PT/INR  3.  Pending labs/ test needing follow-up: None  Follow-up Appointments: Follow-up Information    Follow up with Dellia Nims, MD On 08/09/2015.   Specialty:  Internal Medicine   Why:  10:15AM for hospital follow up   Contact information:   [place] [address] [phone]       Discharge  Instructions: Discharge Instructions    Call MD for:  difficulty breathing, headache or visual disturbances    Complete by:  As directed      Call MD for:  persistant dizziness or light-headedness    Complete by:  As directed      Call MD for:  persistant nausea and vomiting    Complete by:  As directed      Call MD for:  severe uncontrolled pain    Complete by:  As directed      Call MD for:  temperature >100.4    Complete by:  As directed      Increase activity slowly    Complete by:  As directed            Consultations: Treatment Team:  Mauricia Area, MD  Procedures Performed:  Ct Abdomen Pelvis Wo Contrast  07/30/2015  CLINICAL DATA:  Room and was provided with item leaking no go with Nausea and vomiting during dialysis today. Sharp generalized abdominal pain during that time. EXAM: CT ABDOMEN AND PELVIS WITHOUT CONTRAST TECHNIQUE: Multidetector CT imaging of the abdomen and pelvis was performed following the standard protocol without IV contrast. COMPARISON:  No comparison studies available. FINDINGS: Lower chest:  Unremarkable Hepatobiliary: Liver is 19 cm in craniocaudal length. Subtle nodularity of the hepatic contour raises the question of cirrhosis. Layering tiny stones are noted in the lumen of the gallbladder. No intrahepatic or extrahepatic biliary dilation. Pancreas: Numerous coarse dystrophic calcifications are seen throughout the pancreas with dilatation of the main pancreatic duct. Pancreatic parenchyma is diffusely atrophic. Spleen: No splenomegaly. No focal mass lesion. Adrenals/Urinary Tract: No adrenal nodule or mass. Kidneys are unremarkable. No hydronephrosis. No evidence for hydroureter. The urinary bladder appears normal for the degree of distention. Stomach/Bowel: Stomach is nondistended. No gastric wall thickening. No evidence of outlet obstruction. Duodenum is normally positioned as is the ligament of Treitz. No small bowel wall thickening. No small bowel  dilatation. Patient is status post right hemicolectomy. Remaining segments of the colon show mild circumferential wall thickening and overall the colon appears featureless. There is no substantial pericolonic edema or inflammation. Vascular/Lymphatic: No abdominal aortic aneurysm. No abdominal atherosclerotic calcification. There is no gastrohepatic or hepatoduodenal ligament lymphadenopathy. No intraperitoneal or retroperitoneal lymphadenopathy. Multiple small lymph nodes are seen in the retroperitoneal space. No pelvic sidewall lymphadenopathy. Scattered small lymph nodes are seen in the inguinal regions bilaterally. Reproductive: The uterus has normal CT imaging appearance. There is no adnexal mass. Other: No intraperitoneal free fluid. Musculoskeletal: Bone windows reveal  no worrisome lytic or sclerotic osseous lesions. IMPRESSION: 1. Featureless colon with suggestion of mild diffuse circumferential wall thickening. Underlying infectious/inflammatory colitis would be a consideration. 2. Mild hepatomegaly. Liver contour is nodular raising concern for cirrhosis. 3. Dystrophic calcifications seen throughout the pancreas with dilatation of the main pancreatic duct. No discrete mass lesion is seen in the head of the pancreas on this study without IV contrast serial. Features may be related to chronic pain is. MRI without and with contrast could be used to further evaluate and exclude pancreatic head lesion as etiology for the dilatation of the main pancreatic duct. 4. Cholelithiasis. Electronically Signed   By: Misty Stanley M.D.   On: 07/30/2015 14:58   Dg Chest 2 View  08/05/2015  CLINICAL DATA:  Cough and fever for 2 days EXAM: CHEST  2 VIEW COMPARISON:  July 30, 2015 FINDINGS: Lungs are somewhat hyperexpanded. There is no edema or consolidation. The heart size and pulmonary vascularity are normal. No adenopathy. No bone lesions. IMPRESSION: Lungs hyperexpanded.  No edema or consolidation. Electronically  Signed   By: Lowella Grip III M.D.   On: 08/05/2015 12:44   Dg Chest 2 View  07/30/2015  CLINICAL DATA:  Dizziness, fell down, nausea, dialysis patient, elevated lactate, hypertension EXAM: CHEST  2 VIEW COMPARISON:  07/01/2015 FINDINGS: Enlargement of cardiac silhouette. Mediastinal contours and pulmonary vascularity normal. Lungs emphysematous but clear. No infiltrate, pleural effusion or pneumothorax. Diffuse osseous demineralization. IMPRESSION: Emphysematous changes without acute infiltrate. Electronically Signed   By: Lavonia Dana M.D.   On: 07/30/2015 12:50   Ct Head Wo Contrast  07/16/2015  CLINICAL DATA:  Seizure today. EXAM: CT HEAD WITHOUT CONTRAST TECHNIQUE: Contiguous axial images were obtained from the base of the skull through the vertex without intravenous contrast. COMPARISON:  05/09/2015 FINDINGS: The brainstem, cerebellum, cerebral peduncles, thalami, basal ganglia, basilar cisterns, and ventricular system appear within normal limits. Periventricular white matter and corona radiata hypodensities favor chronic ischemic microvascular white matter disease. Mild cerebral atrophy for age. No intracranial hemorrhage, mass lesion, or acute CVA. Frothy material in the left sphenoid sinus. Aplastic frontal sinuses. IMPRESSION: 1. Persistent frothy material in the left sphenoid sinus favoring chronic sinusitis. 2. No acute intracranial findings. 3. Periventricular white matter and corona radiata hypodensities favor chronic ischemic microvascular white matter disease. 4. Mild cerebral atrophy for age. Electronically Signed   By: Van Clines M.D.   On: 07/16/2015 14:40   Ct Lumbar Spine W Contrast  07/15/2015  ADDENDUM REPORT: 07/15/2015 15:32 ADDENDUM: Study discussed by telephone with Dr. Denton Brick (for Dr. Gilles Chiquito ) on 07/15/2015 at 1530 hours. Electronically Signed   By: Genevie Ann M.D.   On: 07/15/2015 15:32  07/15/2015  CLINICAL DATA:  60 year old female with fever of unknown origin,  fecal incontinence, numbness radiating down both lower extremities. Immunocompromised. Initial encounter. EXAM: CT LUMBAR SPINE WITH CONTRAST TECHNIQUE: Multidetector CT imaging of the lumbar spine was performed with intravenous contrast administration. Multiplanar CT image reconstructions were also generated. CONTRAST:  55mL OMNIPAQUE IOHEXOL 300 MG/ML  SOLN COMPARISON:  CT Abdomen and Pelvis 07/13/2011 FINDINGS: Chronic calcific pancreatitis. The gallbladder today is abnormal with wall thickening and/or abundant pericholecystic fluid. Similar fluid and stranding at the porta hepatis. No definite CBD enlargement. There is a right abdominal neo terminal ileum which appears to be new. Other visible abdominal viscera are stable since 2013. Portal venous system appears patent. Aortoiliac calcified atherosclerosis noted. Visualized major arterial structures are patent. No pericardial or pleural effusion.  Negative visualized lung bases. Normal lumbar segmentation. Preserved vertebral height and alignment. Nonspecific mildly sclerotic an heterogeneous bone mineralization diffusely. Visible sacrum and SI joints intact. No acute osseous abnormality identified. No lower thoracic or lumbar spinal stenosis. Normal CT appearance of the visible epidural space. No paraspinal inflammatory stranding. IMPRESSION: 1. Abnormal gallbladder with inflammation extending to the porta hepatis. Consider acute cholecystitis and recommend follow-up imaging with either abdomen ultrasound or CT Abdomen and Pelvis (oral and IV contrast preferred). 2. No acute findings by CT in the lumbar spine. 3. Chronic calcific pancreatitis. Electronically Signed: By: Genevie Ann M.D. On: 07/15/2015 15:18   US Abdomen Limited Ruq  07/16/2015  CLINICAL DATA:  Fever. History of chronic pancreatitis. Initial encounter. EXAM: US ABDOMEN LIMITED - RIGHT UPPER QUADRANT COMPARISON:  Abdominal ultrasound 06/30/2013. FINDINGS: Gallbladder: A sludge ball is seen within  the gallbladder measuring 2 cm in diameter. No stones, wall thickening or pericholecystic fluid identified. Stock reports negative Murphy's sign. Common bile duct: Diameter: 0.3 cm. Liver: No focal lesion identified. Within normal limits in parenchymal echogenicity. IMPRESSION: 2 cm sludge ball within the gallbladder. Negative for stones or cholecystitis. Electronically Signed   By: Inge Rise M.D.   On: 07/16/2015 12:48    Admission HPI:  Ms. 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 She was started on oral Vancomycin for 10 day total course (2/26 - 3/7), but has not taken it since discharge on 2/27 due to inability to afford. She states that she has continued diarrhea with dark black stool and one episode of bright red bloody streak in her stool. She denies any abdominal pain or cramping. She denies any nausea or vomiting. She has lightheadedness/dizziness described as a whirlwind when she sits up. She denies any falls or loss of consciousness. She has baseline SOB and wears 2L O2 via [place] continuously at home, which she says was started for heart failure, and has difficulty walking more than 15 feet (approximately distance from her living room to kitchen). Patient's last dialysis was on Monday, missed Wednesday due to not feeling well.  Patient has long history of alcohol use, and her hematemesis on last admission was attributed to likely Mallory-Weiss tears. She denies any hematemesis since discharge or alcohol use. She does report new cough since last night which is non-productive that feels like mucus stuck in her chest. She describes chest tightness at her sternum/upper abdomen that is worse with cough. She says her husband was treated for pneumonia last week. She reports subjective fever and chills, no  diaphoresis.   Hospital Course by problem list: Principal Problem:   C. difficile diarrhea Active Problems:   Alcohol abuse   Depression   GERD   Seizure disorder (HCC)   Anemia of chronic disease   Coagulopathy (HCC)   ESRD (end stage renal disease) on dialysis ([place])   Infection due to ESBL-producing Escherichia coli   Influenza A   1. C difficile diarrhea: C diff positive antigen and toxin on 07/31/15. Patient started on oral Vancomycin on last admission, but she was unable to afford the medication on an outpatient basis. She reported one episode of bright red blood streak in stool and diarrhea 1-2 times a day. Patient provided Vancomycin, given to daughter to store in refrigerator at home. Patient to continue oral  Vancomycin 125 mg q6h for 10 total days (end date 3/12).  2. Influenza A: Started Tamiflu with dose adjustments for hemodialysis for 5 total days (end date 5/7). As above, patient given Tamiflu to take home.  3. ESRD 2/2SLE FSGS on HD MWF: Missed Wednesday session and received off schedule dialysis on 3/2 and scheduled again for 3/4. Patient discharged after 3/4 HD session with planned return to regular MWF schedule on Monday 3/6.  4. Coagulopathy: Chronic thrombocytopenia. Platelets 38-40K. INR also elevated and trended up to 6.74. Patient reported one episode of bright red blood in stool prior to admission, but no further bleeding. Vitamin K given. INR prior to discharge was 4.32.  5. Asymptomatic bacteruria: ESBL E. Coli on 2/24, received Fosfomycin and was to have 2nd Fosfomycin dose on 3/2, which was given when she was admitted. UCx from 2/24 reports bacteria resistant to Fosfomycin. She was noted to be asymptomatic at the time of initial treatment. Remains asymptomatic. Suspect she has colonized bladder. Would try to avoid giving more abx at this time.  Discharge Vitals:   BP 109/63 mmHg  Pulse 102  Temp(Src) 100.3 F (37.9 C) (Oral)  Resp 16  Ht 5\' 1"  (1.549 m)   Wt 76 lb 4.5 oz (34.6 kg)  BMI 14.42 kg/m2  SpO2 97%  Discharge Labs:  Results for orders placed or performed during the hospital encounter of 08/05/15 (from the past 24 hour(s))  Protime-INR     Status: Abnormal   Collection Time: 08/07/15  5:46 AM  Result Value Ref Range   Prothrombin Time 40.2 (H) 11.6 - 15.2 seconds   INR 4.32 (H) 0.00 - 1.49  Renal function panel     Status: Abnormal   Collection Time: 08/07/15  5:46 AM  Result Value Ref Range   Sodium 135 135 - 145 mmol/L   Potassium 3.1 (L) 3.5 - 5.1 mmol/L   Chloride 102 101 - 111 mmol/L   CO2 25 22 - 32 mmol/L   Glucose, Bld 114 (H) 65 - 99 mg/dL   BUN <5 (L) 6 - 20 mg/dL   Creatinine, Ser 3.35 (H) 0.44 - 1.00 mg/dL   Calcium 7.2 (L) 8.9 - 10.3 mg/dL   Phosphorus 1.8 (L) 2.5 - 4.6 mg/dL   Albumin 1.3 (L) 3.5 - 5.0 g/dL   GFR calc non Af Amer 14 (L) >60 mL/min   GFR calc Af Amer 16 (L) >60 mL/min   Anion gap 8 5 - 15  CBC     Status: Abnormal   Collection Time: 08/07/15  5:46 AM  Result Value Ref Range   WBC 3.3 (L) 4.0 - 10.5 K/uL   RBC 2.38 (L) 3.87 - 5.11 MIL/uL   Hemoglobin 7.7 (L) 12.0 - 15.0 g/dL   HCT 24.6 (L) 36.0 - 46.0 %   MCV 103.4 (H) 78.0 - 100.0 fL   MCH 32.4 26.0 - 34.0 pg   MCHC 31.3 30.0 - 36.0 g/dL   RDW 17.0 (H) 11.5 - 15.5 %   Platelets 29 (LL) 150 - 400 K/uL  Type and screen [place]     Status: None (Preliminary result)   Collection Time: 08/07/15 10:30 AM  Result Value Ref Range   ABO/RH(D) A POS    Antibody Screen NEG    Sample Expiration 08/10/2015    Unit Number [id number]    Blood Component Type RBC LR PHER1    Unit division 00    Status of Unit ISSUED  Transfusion Status OK TO TRANSFUSE    Crossmatch Result Compatible   Prepare RBC     Status: None   Collection Time: 08/07/15 10:30 AM  Result Value Ref Range   Order Confirmation ORDER PROCESSED BY BLOOD BANK     Signed: Milagros Loll, MD 08/07/2015, 3:22 PM    Services Ordered on Discharge:  None Equipment Ordered on Discharge: None

## 2015-08-06 NOTE — Progress Notes (Signed)
Transferred pt to [name]. Daughter, Manroop Dsouza, was informed of the transfer. 08/06/2015

## 2015-08-07 DIAGNOSIS — I950 Idiopathic hypotension: Secondary | ICD-10-CM | POA: Diagnosis not present

## 2015-08-07 DIAGNOSIS — R05 Cough: Secondary | ICD-10-CM | POA: Diagnosis not present

## 2015-08-07 DIAGNOSIS — Z992 Dependence on renal dialysis: Secondary | ICD-10-CM | POA: Diagnosis not present

## 2015-08-07 DIAGNOSIS — N186 End stage renal disease: Secondary | ICD-10-CM | POA: Diagnosis not present

## 2015-08-07 DIAGNOSIS — A047 Enterocolitis due to Clostridium difficile: Secondary | ICD-10-CM | POA: Diagnosis not present

## 2015-08-07 LAB — RENAL FUNCTION PANEL
Albumin: 1.3 g/dL — ABNORMAL LOW (ref 3.5–5.0)
Anion gap: 8 (ref 5–15)
CHLORIDE: 102 mmol/L (ref 101–111)
CO2: 25 mmol/L (ref 22–32)
CREATININE: 3.35 mg/dL — AB (ref 0.44–1.00)
Calcium: 7.2 mg/dL — ABNORMAL LOW (ref 8.9–10.3)
GFR calc Af Amer: 16 mL/min — ABNORMAL LOW (ref >60)
GFR calc non Af Amer: 14 mL/min — ABNORMAL LOW (ref >60)
GLUCOSE: 114 mg/dL — AB (ref 65–99)
POTASSIUM: 3.1 mmol/L — AB (ref 3.5–5.1)
Phosphorus: 1.8 mg/dL — ABNORMAL LOW (ref 2.5–4.6)
Sodium: 135 mmol/L (ref 135–145)

## 2015-08-07 LAB — CBC
HEMATOCRIT: 24.6 % — AB (ref 36.0–46.0)
Hemoglobin: 7.7 g/dL — ABNORMAL LOW (ref 12.0–15.0)
MCH: 32.4 pg (ref 26.0–34.0)
MCHC: 31.3 g/dL (ref 30.0–36.0)
MCV: 103.4 fL — AB (ref 78.0–100.0)
Platelets: 29 10*3/uL — CL (ref 150–400)
RBC: 2.38 MIL/uL — ABNORMAL LOW (ref 3.87–5.11)
RDW: 17 % — AB (ref 11.5–15.5)
WBC: 3.3 10*3/uL — ABNORMAL LOW (ref 4.0–10.5)

## 2015-08-07 LAB — PROTIME-INR
INR: 4.32 — AB (ref 0.00–1.49)
PROTHROMBIN TIME: 40.2 s — AB (ref 11.6–15.2)

## 2015-08-07 LAB — PREPARE RBC (CROSSMATCH)

## 2015-08-07 MED ORDER — SODIUM CHLORIDE 0.9 % IV SOLN: Freq: Once | INTRAVENOUS | Status: DC

## 2015-08-07 MED ORDER — OSELTAMIVIR PHOSPHATE 30 MG PO CAPS: 30.0000 mg | ORAL_CAPSULE | Freq: Every day | ORAL | Status: DC

## 2015-08-07 MED ORDER — VITAMIN K1 10 MG/ML IJ SOLN: 5.0000 mg | Freq: Once | INTRAVENOUS | Status: DC

## 2015-08-07 MED ORDER — PHYTONADIONE 5 MG PO TABS
5.0000 mg | ORAL_TABLET | Freq: Once | ORAL | Status: AC
  Administered 2015-08-07: 5 mg via ORAL
  Filled 2015-08-07: qty 1

## 2015-08-07 NOTE — Progress Notes (Addendum)
   Subjective: Feeling better overall. She has loose stools last episodes last night and this morning. No new complaints otherwise.  Objective: Vital signs in last 24 hours: Filed Vitals:   08/07/15 1117 08/07/15 1130 08/07/15 1132 08/07/15 1135  BP: 132/85 144/80 144/51 142/86  Pulse: 80 79 77 76  Temp: 97.6 F (36.4 C)  97.6 F (36.4 C) 97.6 F (36.4 C)  TempSrc: Oral   Oral  Resp: 19 18 18 15   Height:      Weight:    76 lb 4.5 oz (34.6 kg)  SpO2: 95%  95% 95%   Weight change: 4 lb 13.6 oz (2.2 kg)  Intake/Output Summary (Last 24 hours) at 08/07/15 1210 Last data filed at 08/07/15 1135  Gross per 24 hour  Intake    727 ml  Output    765 ml  Net    -38 ml   General: cachectic woman, resting in bed, no acute distress Cardiac: RRR, no rubs, murmurs or gallops Pulm: mild crackles left lower lung field, otherwise diminished breath sounds but clear Abd: soft, nontender, nondistended, BS present Ext: thin extremities, no pedal edema Neuro: alert and oriented X3, cranial nerves II-XII grossly intact  Assessment/Plan: 60 year old woman with end-stage renal disease on hemodialysis MWF secondary to systemic lupus, focal segmental glomerularsclerosis, chronic diastolic heart failure, chronic alcohol abuse, and chronic pancreatitis who was recently admitted to the hospital for an episode of C. difficile colitis.  C difficile diarrhea: Improving. 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 Probable some elevated INR from vitamin K deficiency due to diarrhea. -PO Vitamin K 5mg  once today. -Consider transfusion at <50k with active bleeding -Monitor for bleeding  Asymptomatic bacteruria: ESBL E. Coli on 2/24, received Fosfomycin and was  to have 2nd Fosfomycin dose on 3/2. Patient asymptomatic, but on immunosuppressants. -Given Fosfomycin once on 3/2 -UCx reports bacteria resistant to Fosfomycin. She was noted to be asymptomatic at the time of initial treatment. Remains asymptomatic. Would try to avoid giving more abx at this time.  Dispo:  Likely home today. Close f/u in [facility].  Milagros Loll, MD 08/07/2015, 12:10 PM

## 2015-08-07 NOTE — Progress Notes (Signed)
CM received call from RN who states pt has both medicare and medicaid and consult is placed for med asst. (MATCH).  MATCH  Is not available for pt's who are insured.  No other CM needs were communicated.

## 2015-08-07 NOTE — Progress Notes (Signed)
NURSING PROGRESS NOTE  [name] [name] [id number] Discharge Data: 08/07/2015 4:48 PM Attending Provider: Oval Linsey, MD [id number] Marlowe Sax, MD   [name] to be D/C'd Home per MD order.    All IV's will be discontinued and monitored for bleeding.  All belongings will be returned to patient for patient to take home.  Last Documented Vital Signs:  Blood pressure 109/63, pulse 102, temperature 100.3 F (37.9 C), temperature source Oral, resp. rate 16, height 5\' 1"  (1.549 m), weight 34.6 kg (76 lb 4.5 oz), SpO2 97 %.  Joslyn Hy, MSN, RN, CMSRN     2

## 2015-08-07 NOTE — Progress Notes (Signed)
CRITICAL VALUE ALERT  Critical value received: Platelets 29,000  Date of notification:  08/07/2015  Time of notification:  0940  Critical value read back:Zenaida,RN  MD notified (1st page):  yes  Time of first page:  [phone]  MD notified (2nd page):  Time of second page:  Responding MD:  Pearson Grippe,  Time MD responded:  [phone]

## 2015-08-07 NOTE — Discharge Instructions (Signed)
Please continue your regular dialysis.  Take vancomycin 2.5 mLs (125 mg total) by mouth 4 (four) times daily for 8 more days (finish on 08/15/2015).  Take oseltamivir (Tamiflu) 1 capsule by mouth after your dialysis session on Monday.

## 2015-08-07 NOTE — Procedures (Signed)
I was present at this dialysis session. I have reviewed the session itself and made appropriate changes.   No new issues.  On tamiflu.  Will transfuse if Hb < 7.3.  Pt w/o complaints. Labs pending.   Pearson Grippe  MD 08/07/2015, 8:07 AM

## 2015-08-08 LAB — TYPE AND SCREEN
ABO/RH(D): A POS
Antibody Screen: NEGATIVE
UNIT DIVISION: 0

## 2015-08-09 ENCOUNTER — Other Ambulatory Visit: Payer: Self-pay | Admitting: *Deleted

## 2015-08-09 ENCOUNTER — Ambulatory Visit: Payer: Self-pay | Admitting: Internal Medicine

## 2015-08-09 NOTE — Patient Outreach (Signed)
Member discharged over the weekend after being readmitted and diagnosed with flu and C. Diff.  She is scheduled for a follow up appointment today, but according to conversation last week, member will not make appointment due to dialysis.  She was to call to reschedule but was readmitted.  Call placed to PCP office to inform them of scheduling conflict.  Appointment today canceled, new appointment scheduled for Thursday.  This care manger will attend follow up appointment as initial home visit was originally scheduled for that day.  Call placed to member to initiate new transition of care program and to inform her about new appointment, no answer.  Unable to leave a message.  Will attempt contact tomorrow.  [name], BSN, [place] [employer]  [facility] Care Manager [phone]

## 2015-08-10 ENCOUNTER — Other Ambulatory Visit: Payer: Self-pay | Admitting: *Deleted

## 2015-08-10 NOTE — Patient Outreach (Signed)
Call placed to member to initiate new transition of care program.  Member state that she is "feeling a lot better."  She state that she has all of her medications that she has been discharged with and is taking them appropriately.  Member reports that she was unable to have dialysis yesterday due to a problem with her access, and was not able to have it today.  She report that she will have it tomorrow, Thursday, and Friday.  Member made aware that this care manager called her PCP office to have her appointment that was scheduled for yesterday changed to Thursday due to dialysis.  Member states that she will not be able to go Thursday, instructed to call and reschedule for Tuesday next week instead.  She verbalizes understanding.  Member reports a decrease in her diarrhea, stating that she is only going 2-3 times a day.  She denies any pain or discomfort at this time.  She denies any concern.  Informed member that this care manager will attempt to attend PCP follow up next week.  She verbalizes understanding.    [name], BSN, [employer]  [facility] Care Manager [phone]

## 2015-08-11 ENCOUNTER — Telehealth: Payer: Self-pay | Admitting: Pharmacist

## 2015-08-11 ENCOUNTER — Encounter: Payer: Self-pay | Admitting: *Deleted

## 2015-08-11 DIAGNOSIS — D688 Other specified coagulation defects: Secondary | ICD-10-CM | POA: Diagnosis not present

## 2015-08-11 DIAGNOSIS — N186 End stage renal disease: Secondary | ICD-10-CM | POA: Diagnosis not present

## 2015-08-11 DIAGNOSIS — E876 Hypokalemia: Secondary | ICD-10-CM | POA: Diagnosis not present

## 2015-08-11 DIAGNOSIS — R197 Diarrhea, unspecified: Secondary | ICD-10-CM | POA: Diagnosis not present

## 2015-08-11 NOTE — Telephone Encounter (Signed)
Transition Care Management Follow-up Telephone Call   Date discharged? 08/07/15   How have you been since you were released from the hospital? Patient reports symptom improvement   Do you understand why you were in the hospital? yes   Do you understand the discharge instructions? yes   Where were you discharged to? home   Items Reviewed:  Medications reviewed: yes, briefly, patient reports no questions or concerns  Referrals reviewed: yes  Functional Questionnaire:   Activities of Daily Living (ADLs):   She states she is independent   Any transportation issues/concerns?: no   Any patient concerns? no   Confirmed importance and date/time of follow-up visits scheduled PCP appointment 08/12/15  Provider Appointment booked with Dr. Genene Churn  Confirmed with patient if condition begins to worsen call PCP or go to the ER.  Patient was given the office number and encouraged to call back with question or concerns.  : yes

## 2015-08-12 ENCOUNTER — Ambulatory Visit: Payer: Self-pay | Admitting: *Deleted

## 2015-08-12 ENCOUNTER — Encounter: Payer: Self-pay | Admitting: Internal Medicine

## 2015-08-12 ENCOUNTER — Ambulatory Visit: Payer: Self-pay | Admitting: Internal Medicine

## 2015-08-13 DIAGNOSIS — E876 Hypokalemia: Secondary | ICD-10-CM | POA: Diagnosis not present

## 2015-08-13 DIAGNOSIS — D688 Other specified coagulation defects: Secondary | ICD-10-CM | POA: Diagnosis not present

## 2015-08-13 DIAGNOSIS — R197 Diarrhea, unspecified: Secondary | ICD-10-CM | POA: Diagnosis not present

## 2015-08-13 DIAGNOSIS — N186 End stage renal disease: Secondary | ICD-10-CM | POA: Diagnosis not present

## 2015-08-16 DIAGNOSIS — R197 Diarrhea, unspecified: Secondary | ICD-10-CM | POA: Diagnosis not present

## 2015-08-16 DIAGNOSIS — E876 Hypokalemia: Secondary | ICD-10-CM | POA: Diagnosis not present

## 2015-08-16 DIAGNOSIS — N186 End stage renal disease: Secondary | ICD-10-CM | POA: Diagnosis not present

## 2015-08-16 DIAGNOSIS — D688 Other specified coagulation defects: Secondary | ICD-10-CM | POA: Diagnosis not present

## 2015-08-17 ENCOUNTER — Other Ambulatory Visit: Payer: Self-pay | Admitting: *Deleted

## 2015-08-17 ENCOUNTER — Ambulatory Visit: Payer: Self-pay | Admitting: *Deleted

## 2015-08-17 NOTE — Patient Outreach (Signed)
Weekly transition of care call placed to member.  Female answering the phone state that she is not home, Request made to call this care manager back.  Will await all back.  Noted that member did not call PCP office to reschedule last week's appointment to this week.  Will have member reschedule appointment.  [name], BSN, [facility]  [facility] Care Manager [phone]

## 2015-08-19 ENCOUNTER — Inpatient Hospital Stay (HOSPITAL_COMMUNITY)
Admission: AD | Admit: 2015-08-19 | Discharge: 2015-09-02 | DRG: 356 | Disposition: A | Discharge status: Skilled Nursing Facility | Payer: Medicare Other | Source: Ambulatory Visit | Attending: Oncology | Admitting: Oncology

## 2015-08-19 ENCOUNTER — Telehealth: Payer: Self-pay | Admitting: *Deleted

## 2015-08-19 ENCOUNTER — Ambulatory Visit (INDEPENDENT_AMBULATORY_CARE_PROVIDER_SITE_OTHER): Payer: Medicare Other | Admitting: Internal Medicine

## 2015-08-19 DIAGNOSIS — N186 End stage renal disease: Secondary | ICD-10-CM

## 2015-08-19 DIAGNOSIS — Z862 Personal history of diseases of the blood and blood-forming organs and certain disorders involving the immune mechanism: Secondary | ICD-10-CM

## 2015-08-19 DIAGNOSIS — D631 Anemia in chronic kidney disease: Secondary | ICD-10-CM | POA: Diagnosis not present

## 2015-08-19 DIAGNOSIS — G4700 Insomnia, unspecified: Secondary | ICD-10-CM | POA: Diagnosis present

## 2015-08-19 DIAGNOSIS — D696 Thrombocytopenia, unspecified: Secondary | ICD-10-CM | POA: Diagnosis not present

## 2015-08-19 DIAGNOSIS — E781 Pure hyperglyceridemia: Secondary | ICD-10-CM | POA: Diagnosis not present

## 2015-08-19 DIAGNOSIS — K861 Other chronic pancreatitis: Secondary | ICD-10-CM | POA: Diagnosis present

## 2015-08-19 DIAGNOSIS — E43 Unspecified severe protein-calorie malnutrition: Secondary | ICD-10-CM | POA: Diagnosis not present

## 2015-08-19 DIAGNOSIS — R509 Fever, unspecified: Secondary | ICD-10-CM | POA: Insufficient documentation

## 2015-08-19 DIAGNOSIS — E538 Deficiency of other specified B group vitamins: Secondary | ICD-10-CM | POA: Diagnosis present

## 2015-08-19 DIAGNOSIS — F32A Depression, unspecified: Secondary | ICD-10-CM | POA: Diagnosis present

## 2015-08-19 DIAGNOSIS — E872 Acidosis: Secondary | ICD-10-CM | POA: Diagnosis present

## 2015-08-19 DIAGNOSIS — F329 Major depressive disorder, single episode, unspecified: Secondary | ICD-10-CM | POA: Diagnosis not present

## 2015-08-19 DIAGNOSIS — Z992 Dependence on renal dialysis: Secondary | ICD-10-CM

## 2015-08-19 DIAGNOSIS — M3214 Glomerular disease in systemic lupus erythematosus: Secondary | ICD-10-CM | POA: Diagnosis present

## 2015-08-19 DIAGNOSIS — R278 Other lack of coordination: Secondary | ICD-10-CM | POA: Diagnosis not present

## 2015-08-19 DIAGNOSIS — A0472 Enterocolitis due to Clostridium difficile, not specified as recurrent: Secondary | ICD-10-CM

## 2015-08-19 DIAGNOSIS — I132 Hypertensive heart and chronic kidney disease with heart failure and with stage 5 chronic kidney disease, or end stage renal disease: Secondary | ICD-10-CM | POA: Diagnosis present

## 2015-08-19 DIAGNOSIS — Z881 Allergy status to other antibiotic agents status: Secondary | ICD-10-CM

## 2015-08-19 DIAGNOSIS — A047 Enterocolitis due to Clostridium difficile: Principal | ICD-10-CM | POA: Diagnosis present

## 2015-08-19 DIAGNOSIS — I120 Hypertensive chronic kidney disease with stage 5 chronic kidney disease or end stage renal disease: Secondary | ICD-10-CM | POA: Diagnosis not present

## 2015-08-19 DIAGNOSIS — I5032 Chronic diastolic (congestive) heart failure: Secondary | ICD-10-CM | POA: Diagnosis not present

## 2015-08-19 DIAGNOSIS — R040 Epistaxis: Secondary | ICD-10-CM | POA: Diagnosis not present

## 2015-08-19 DIAGNOSIS — J449 Chronic obstructive pulmonary disease, unspecified: Secondary | ICD-10-CM | POA: Diagnosis not present

## 2015-08-19 DIAGNOSIS — R64 Cachexia: Secondary | ICD-10-CM | POA: Diagnosis not present

## 2015-08-19 DIAGNOSIS — L8990 Pressure ulcer of unspecified site, unspecified stage: Secondary | ICD-10-CM | POA: Insufficient documentation

## 2015-08-19 DIAGNOSIS — K7290 Hepatic failure, unspecified without coma: Secondary | ICD-10-CM | POA: Diagnosis not present

## 2015-08-19 DIAGNOSIS — N2581 Secondary hyperparathyroidism of renal origin: Secondary | ICD-10-CM | POA: Diagnosis present

## 2015-08-19 DIAGNOSIS — T82818A Embolism of vascular prosthetic devices, implants and grafts, initial encounter: Secondary | ICD-10-CM | POA: Diagnosis not present

## 2015-08-19 DIAGNOSIS — M329 Systemic lupus erythematosus, unspecified: Secondary | ICD-10-CM | POA: Diagnosis present

## 2015-08-19 DIAGNOSIS — I5042 Chronic combined systolic (congestive) and diastolic (congestive) heart failure: Secondary | ICD-10-CM

## 2015-08-19 DIAGNOSIS — Y832 Surgical operation with anastomosis, bypass or graft as the cause of abnormal reaction of the patient, or of later complication, without mention of misadventure at the time of the procedure: Secondary | ICD-10-CM | POA: Diagnosis not present

## 2015-08-19 DIAGNOSIS — F1021 Alcohol dependence, in remission: Secondary | ICD-10-CM

## 2015-08-19 DIAGNOSIS — K7030 Alcoholic cirrhosis of liver without ascites: Secondary | ICD-10-CM | POA: Insufficient documentation

## 2015-08-19 DIAGNOSIS — K860 Alcohol-induced chronic pancreatitis: Secondary | ICD-10-CM | POA: Diagnosis not present

## 2015-08-19 DIAGNOSIS — N269 Renal sclerosis, unspecified: Secondary | ICD-10-CM | POA: Diagnosis present

## 2015-08-19 DIAGNOSIS — K7031 Alcoholic cirrhosis of liver with ascites: Secondary | ICD-10-CM | POA: Diagnosis present

## 2015-08-19 DIAGNOSIS — Z9981 Dependence on supplemental oxygen: Secondary | ICD-10-CM

## 2015-08-19 DIAGNOSIS — T426X5A Adverse effect of other antiepileptic and sedative-hypnotic drugs, initial encounter: Secondary | ICD-10-CM | POA: Diagnosis not present

## 2015-08-19 DIAGNOSIS — D638 Anemia in other chronic diseases classified elsewhere: Secondary | ICD-10-CM | POA: Diagnosis present

## 2015-08-19 DIAGNOSIS — Z681 Body mass index (BMI) 19 or less, adult: Secondary | ICD-10-CM | POA: Diagnosis not present

## 2015-08-19 DIAGNOSIS — G40909 Epilepsy, unspecified, not intractable, without status epilepticus: Secondary | ICD-10-CM

## 2015-08-19 DIAGNOSIS — K7460 Unspecified cirrhosis of liver: Secondary | ICD-10-CM | POA: Diagnosis present

## 2015-08-19 DIAGNOSIS — J9620 Acute and chronic respiratory failure, unspecified whether with hypoxia or hypercapnia: Secondary | ICD-10-CM | POA: Diagnosis not present

## 2015-08-19 DIAGNOSIS — A0471 Enterocolitis due to Clostridium difficile, recurrent: Secondary | ICD-10-CM | POA: Diagnosis present

## 2015-08-19 DIAGNOSIS — Z87891 Personal history of nicotine dependence: Secondary | ICD-10-CM

## 2015-08-19 DIAGNOSIS — K219 Gastro-esophageal reflux disease without esophagitis: Secondary | ICD-10-CM | POA: Diagnosis not present

## 2015-08-19 DIAGNOSIS — Z0000 Encounter for general adult medical examination without abnormal findings: Secondary | ICD-10-CM

## 2015-08-19 DIAGNOSIS — D61818 Other pancytopenia: Secondary | ICD-10-CM

## 2015-08-19 DIAGNOSIS — T82868A Thrombosis of vascular prosthetic devices, implants and grafts, initial encounter: Secondary | ICD-10-CM | POA: Insufficient documentation

## 2015-08-19 DIAGNOSIS — K769 Liver disease, unspecified: Secondary | ICD-10-CM | POA: Diagnosis not present

## 2015-08-19 DIAGNOSIS — D619 Aplastic anemia, unspecified: Secondary | ICD-10-CM | POA: Diagnosis present

## 2015-08-19 DIAGNOSIS — Z79899 Other long term (current) drug therapy: Secondary | ICD-10-CM

## 2015-08-19 LAB — CBC WITH DIFFERENTIAL/PLATELET
BASOS ABS: 0.1 10*3/uL (ref 0.0–0.1)
BASOS PCT: 1 %
EOS ABS: 0 10*3/uL (ref 0.0–0.7)
EOS PCT: 0 %
HCT: 32.2 % — ABNORMAL LOW (ref 36.0–46.0)
HEMOGLOBIN: 10.9 g/dL — AB (ref 12.0–15.0)
LYMPHS PCT: 20 %
Lymphs Abs: 1.4 10*3/uL (ref 0.7–4.0)
MCH: 35.6 pg — AB (ref 26.0–34.0)
MCHC: 33.9 g/dL (ref 30.0–36.0)
MCV: 105.2 fL — ABNORMAL HIGH (ref 78.0–100.0)
MONO ABS: 0.9 10*3/uL (ref 0.1–1.0)
Monocytes Relative: 13 %
NEUTROS PCT: 66 %
Neutro Abs: 4.8 10*3/uL (ref 1.7–7.7)
PLATELETS: 44 10*3/uL — AB (ref 150–400)
RBC: 3.06 MIL/uL — ABNORMAL LOW (ref 3.87–5.11)
RDW: 22.4 % — ABNORMAL HIGH (ref 11.5–15.5)
WBC: 7.2 10*3/uL (ref 4.0–10.5)

## 2015-08-19 LAB — BILIRUBIN, FRACTIONATED(TOT/DIR/INDIR)
BILIRUBIN DIRECT: 3.5 mg/dL — AB (ref 0.1–0.5)
BILIRUBIN INDIRECT: 2.4 mg/dL — AB (ref 0.3–0.9)
BILIRUBIN TOTAL: 5.9 mg/dL — AB (ref 0.3–1.2)

## 2015-08-19 LAB — COMPREHENSIVE METABOLIC PANEL
ALBUMIN: 1.3 g/dL — AB (ref 3.5–5.0)
ALK PHOS: 183 U/L — AB (ref 38–126)
ALT: 13 U/L — ABNORMAL LOW (ref 14–54)
ANION GAP: 18 — AB (ref 5–15)
AST: 78 U/L — ABNORMAL HIGH (ref 15–41)
BUN: 6 mg/dL (ref 6–20)
CALCIUM: 7.7 mg/dL — AB (ref 8.9–10.3)
CHLORIDE: 96 mmol/L — AB (ref 101–111)
CO2: 17 mmol/L — AB (ref 22–32)
Creatinine, Ser: 4.08 mg/dL — ABNORMAL HIGH (ref 0.44–1.00)
GFR calc non Af Amer: 11 mL/min — ABNORMAL LOW (ref >60)
GFR, EST AFRICAN AMERICAN: 13 mL/min — AB (ref >60)
GLUCOSE: 137 mg/dL — AB (ref 65–99)
Potassium: 4.4 mmol/L (ref 3.5–5.1)
SODIUM: 131 mmol/L — AB (ref 135–145)
Total Bilirubin: 6 mg/dL — ABNORMAL HIGH (ref 0.3–1.2)
Total Protein: 7.4 g/dL (ref 6.5–8.1)

## 2015-08-19 LAB — MAGNESIUM: Magnesium: 1.5 mg/dL — ABNORMAL LOW (ref 1.7–2.4)

## 2015-08-19 LAB — PROTIME-INR
INR: 1.49 (ref 0.00–1.49)
Prothrombin Time: 18.1 seconds — ABNORMAL HIGH (ref 11.6–15.2)

## 2015-08-19 LAB — MRSA PCR SCREENING: MRSA by PCR: NEGATIVE

## 2015-08-19 LAB — TECHNOLOGIST SMEAR REVIEW

## 2015-08-19 LAB — APTT: APTT: 41 s — AB (ref 24–37)

## 2015-08-19 MED ORDER — CALCIUM CARBONATE ANTACID 500 MG PO CHEW: 1000.0000 | CHEWABLE_TABLET | Freq: Every day | ORAL | Status: DC

## 2015-08-19 MED ORDER — MIRTAZAPINE 15 MG PO TABS
7.5000 mg | ORAL_TABLET | Freq: Every day | ORAL | Status: DC
  Administered 2015-08-19 – 2015-09-01 (×14): 7.5 mg via ORAL
  Filled 2015-08-19 (×15): qty 1

## 2015-08-19 MED ORDER — MAGNESIUM CHLORIDE 64 MG PO TBEC
1.0000 | DELAYED_RELEASE_TABLET | Freq: Once | ORAL | Status: AC
  Administered 2015-08-20: 64 mg via ORAL
  Filled 2015-08-19 (×2): qty 1

## 2015-08-19 MED ORDER — ALBUTEROL SULFATE (2.5 MG/3ML) 0.083% IN NEBU: 3.0000 mL | INHALATION_SOLUTION | Freq: Four times a day (QID) | RESPIRATORY_TRACT | Status: DC | PRN

## 2015-08-19 MED ORDER — PANTOPRAZOLE SODIUM 40 MG PO TBEC
40.0000 mg | DELAYED_RELEASE_TABLET | Freq: Every day | ORAL | Status: DC
  Administered 2015-08-19 – 2015-09-02 (×14): 40 mg via ORAL
  Filled 2015-08-19 (×16): qty 1

## 2015-08-19 MED ORDER — VITAMIN B-1 100 MG PO TABS
100.0000 mg | ORAL_TABLET | Freq: Every day | ORAL | Status: DC
  Administered 2015-08-19 – 2015-09-02 (×15): 100 mg via ORAL
  Filled 2015-08-19 (×16): qty 1

## 2015-08-19 MED ORDER — ONDANSETRON HCL 4 MG/2ML IJ SOLN
4.0000 mg | Freq: Four times a day (QID) | INTRAMUSCULAR | Status: DC | PRN
  Administered 2015-08-30: 4 mg via INTRAVENOUS
  Filled 2015-08-19 (×2): qty 2

## 2015-08-19 MED ORDER — AZATHIOPRINE 50 MG PO TABS
50.0000 mg | ORAL_TABLET | Freq: Every day | ORAL | Status: DC
  Administered 2015-08-19: 50 mg via ORAL
  Filled 2015-08-19 (×3): qty 1

## 2015-08-19 MED ORDER — FOLIC ACID 1 MG PO TABS
1.0000 mg | ORAL_TABLET | Freq: Every day | ORAL | Status: DC
  Administered 2015-08-19 – 2015-09-02 (×14): 1 mg via ORAL
  Filled 2015-08-19 (×16): qty 1

## 2015-08-19 MED ORDER — ACETAMINOPHEN 325 MG PO TABS
650.0000 mg | ORAL_TABLET | Freq: Four times a day (QID) | ORAL | Status: DC | PRN
  Administered 2015-08-20 – 2015-09-01 (×4): 650 mg via ORAL
  Filled 2015-08-19 (×6): qty 2

## 2015-08-19 MED ORDER — GABAPENTIN 300 MG PO CAPS
300.0000 mg | ORAL_CAPSULE | Freq: Every day | ORAL | Status: DC
  Administered 2015-08-19 – 2015-08-24 (×6): 300 mg via ORAL
  Filled 2015-08-19 (×6): qty 1

## 2015-08-19 MED ORDER — LEVETIRACETAM 250 MG PO TABS
250.0000 mg | ORAL_TABLET | Freq: Every day | ORAL | Status: DC
  Administered 2015-08-19 – 2015-09-02 (×14): 250 mg via ORAL
  Filled 2015-08-19 (×14): qty 1

## 2015-08-19 MED ORDER — ACETAMINOPHEN 650 MG RE SUPP
650.0000 mg | Freq: Four times a day (QID) | RECTAL | Status: DC | PRN
Start: 2015-08-19 — End: 2015-09-02
  Administered 2015-08-25: 650 mg via RECTAL
  Filled 2015-08-19 (×2): qty 1

## 2015-08-19 MED ORDER — HYDROXYCHLOROQUINE SULFATE 200 MG PO TABS
400.0000 mg | ORAL_TABLET | Freq: Every day | ORAL | Status: DC
  Administered 2015-08-19 – 2015-09-02 (×15): 400 mg via ORAL
  Filled 2015-08-19 (×16): qty 2

## 2015-08-19 MED ORDER — FLORANEX PO PACK
1.0000 g | PACK | Freq: Three times a day (TID) | ORAL | Status: DC
Start: 2015-08-19 — End: 2015-09-02
  Administered 2015-08-19 – 2015-09-02 (×35): 1 g via ORAL
  Filled 2015-08-19 (×43): qty 1

## 2015-08-19 MED ORDER — VANCOMYCIN 50 MG/ML ORAL SOLUTION
125.0000 mg | Freq: Four times a day (QID) | ORAL | Status: DC
  Administered 2015-08-19 – 2015-08-20 (×3): 125 mg via ORAL
  Filled 2015-08-19 (×4): qty 2.5

## 2015-08-19 MED ORDER — PANCRELIPASE (LIP-PROT-AMYL) 12000-38000 UNITS PO CPEP
12000.0000 [IU] | ORAL_CAPSULE | Freq: Three times a day (TID) | ORAL | Status: DC
  Administered 2015-08-20 – 2015-09-02 (×22): 12000 [IU] via ORAL
  Filled 2015-08-19 (×35): qty 1

## 2015-08-19 MED ORDER — MAGNESIUM SULFATE 2 GM/50ML IV SOLN
2.0000 g | Freq: Once | INTRAVENOUS | Status: DC
  Filled 2015-08-19: qty 50

## 2015-08-19 MED ORDER — ADULT MULTIVITAMIN W/MINERALS CH
1.0000 | ORAL_TABLET | Freq: Every day | ORAL | Status: DC
  Administered 2015-08-19 – 2015-08-26 (×7): 1 via ORAL
  Filled 2015-08-19 (×9): qty 1

## 2015-08-19 MED ORDER — FLUOXETINE HCL 10 MG PO CAPS
10.0000 mg | ORAL_CAPSULE | Freq: Every day | ORAL | Status: DC
  Administered 2015-08-19 – 2015-09-02 (×14): 10 mg via ORAL
  Filled 2015-08-19 (×15): qty 1

## 2015-08-19 MED ORDER — ONDANSETRON HCL 4 MG PO TABS: 4.0000 mg | ORAL_TABLET | Freq: Four times a day (QID) | ORAL | Status: DC | PRN

## 2015-08-19 NOTE — Progress Notes (Signed)
LAURIEANN LYERLY is a 60 y.o. female patient admitted from office awake, alert - oriented  X 4 - no acute distress noted.  VSS - Blood pressure 117/78, pulse 76, temperature 97.5 F (36.4 C), temperature source Oral, resp. rate 18, height 5\' 1"  (1.549 m), weight 38.6 kg (85 lb 1.6 oz), SpO2 97 %.     Placed on enteric/contact precautions.  Orientation to room, and floor completed with information packet given to patient/family.  Patient declined safety video at this time.  Admission INP armband ID verified with patient/family, and in place.   SR up x 2, fall assessment complete, with patient and family able to verbalize understanding of risk associated with falls, and verbalized understanding to call nsg before up out of bed.  Call light within reach, patient able to voice, and demonstrate understanding.  Skin, clean-dry- intact without evidence of bruising, or skin tears.   No evidence of skin break down noted on exam.  MD paged to make aware of patient's arrival to unit and need for orders.      Will cont to eval and treat per MD orders.  Janalyn Shy, RN 08/19/2015 5:41 PM

## 2015-08-19 NOTE — Assessment & Plan Note (Signed)
HPI: Missed dialysis on Wednesday.  A: ESRD on dialysis  P: Patient to be admitted

## 2015-08-19 NOTE — Progress Notes (Addendum)
6:21 PM MD at bedside to place orders

## 2015-08-19 NOTE — Assessment & Plan Note (Signed)
HPI: She did take Oral Vancomycin four times a day since her last hospital discharge on 3/4 and finished the course of 3/12.  She did not miss doses.  She did not appreciate any improvement in her diarrhea and still is having severe diarrhea.  She missed dialysis yesterday because "I was covered in shit" and is very weak due to her ongoing diarrhea.  She had to use the wheelchair today to get to her appointment.  A: C difficile diarrhea in patient with ESRD  P:   I would treat this as first recurrence of her C diff, if testing comes back positive for C diff and repeat a 14 day course of vancomycin.  Adherence must be stressed.   I obtained stat labs that show a mild AG metabolic acidosis, expected elevated creatinine, and elevated bilirubin thus I do not feel comfortable managing her as an outpatient and will admit her to hopefully monitor her volume status, and restart antibiotics.  We may need to consider placement in a SNF for the duration of her C diff course. -Discussed with admitting resident Dr Naaman Plummer.

## 2015-08-19 NOTE — Progress Notes (Signed)
[facility] Subjective:   [name] female   DOB: [birth year] 60 y.o.   MRN: [id number]  HPI: [name] is a 60 y.o. female with a PMH detailed below who presents for an acute visit for diarrhea and weakness.  Please see problem based charting below for the status of her chronic medical problems    Past Medical History  Diagnosis Date  . Anemia, B12 deficiency   . History of acute pancreatitis   . Right knee pain     No recent imaging on chart  . 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 Dr. Mancel [name], [facility]  . Hypertriglyceridemia   . GERD (gastroesophageal reflux disease)   . Subdural hematoma ([place]) 02/2008    Likely 2/2 trauma from seizure from EtOH withdrawal, chronic in nature, sees Dr. Jerene [name]. Most recent CT head 10/2009 showing stable but persistent hematoma without mass effect.  . History of seizure disorder     Likely 2/2 alcohol abuse  . Hypocalcemia   . Hypomagnesemia   . Failure to thrive in childhood     Unclear etiology  . HTN (hypertension)   . Thrombocytopenia ([place])   . Hepatomegaly     On exam  . Joint pain   . Alcohol abuse   . Vitamin D deficiency   . Pancreatitis   . Insomnia   . Hyperlipidemia   . Pernicious anemia   . Macrocytic anemia   . 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 Depression   . Fx humeral neck 04/17/2011    Transverse fracture- minimally displaced- managed as outpatient   . ABNORMAL PAP SMEAR, LGSIL 07/23/2008    Annotation: HPV positive CIN I Dr. Mancel [name], [facility] Qualifier: Diagnosis of  By: [name] [name]    . Pneumonia 05/20/2012  . Arthritis     "shoulders" (08/15/2013)  . 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  Marland Kitchen Chronic diastolic CHF (congestive heart failure) ([place])   . Hypomagnesemia   . Seizures ([place])     "don't know when/why I had them;  daughter was always there w/me"  . On home oxygen therapy     "2L; 24/7" (07/31/2015)  . Shortness of breath dyspnea   . Pleural effusion   . Renal insufficiency    Current Outpatient Prescriptions  Medication Sig Dispense Refill  . albuterol (PROAIR HFA) 108 (90 BASE) MCG/ACT inhaler Inhale 1-2 puffs into the lungs every 6 (six) hours as needed for wheezing or shortness of breath. 1 Inhaler 1  . azaTHIOprine (IMURAN) 50 MG tablet Take 50 mg by mouth daily.    . calcium carbonate (TUMS - DOSED IN MG ELEMENTAL CALCIUM) 500 MG chewable tablet Chew 1,000 tablets by mouth at bedtime.    . camphor-menthol (SARNA) lotion Apply 1 application topically daily as needed for itching.    . cetirizine (ZYRTEC) 10 MG tablet Take 1 tablet (10 mg total) by mouth daily as needed for allergies. 30 tablet 3  . CREON 12000 UNITS CPEP capsule Take 1 capsule (12,000 Units total) by mouth 3 (three) times daily before meals. 270 capsule 3  . Darbepoetin Alfa (ARANESP) 150 MCG/0.3ML SOSY injection Inject 0.3 mLs (150 mcg total) into the vein every Wednesday with hemodialysis. 1.68 mL   . FLUoxetine (PROZAC) 10 MG capsule Take 1 capsule (10 mg  total) by mouth daily. For depression 90 capsule 3  . folic acid (FOLVITE) 1 MG tablet Take 1 tablet (1 mg total) by mouth daily. For folic acid replacement 30 tablet 6  . gabapentin (NEURONTIN) 300 MG capsule Take 1 capsule (300 mg total) by mouth daily. 90 capsule 3  . hydroxychloroquine (PLAQUENIL) 200 MG tablet Take 400 mg by mouth daily.    . Lactobacillus (ACIDOPHILUS PROBIOTIC PO) Take 2 mg by mouth 3 (three) times daily. Take 2 (two) tablets by mouth three times daily    . levETIRAcetam (KEPPRA) 250 MG tablet Take 1 tablet (250 mg total) by mouth daily. Take additional 500mg  (2 tablets) after Dialysis treatments on Monday, Wednesday, Friday 30 tablet 0  . mirtazapine (REMERON) 7.5 MG tablet Take 1 tablet (7.5 mg total) by mouth at bedtime. 90 tablet 3  . Multiple Vitamin  (MULTIVITAMIN WITH MINERALS) TABS tablet Take 1 tablet by mouth daily. For vitamin replacement 90 tablet 3  . omeprazole (PRILOSEC) 40 MG capsule Take 1 capsule (40 mg total) by mouth daily. 90 capsule 3  . oseltamivir (TAMIFLU) 30 MG capsule Take 1 capsule (30 mg total) by mouth daily.    Marland Kitchen thiamine (VITAMIN B-1) 100 MG tablet Take 1 tablet (100 mg total) by mouth daily. For low thiamine 90 tablet 3  . vancomycin (VANCOCIN) 50 mg/mL oral solution Take 2.5 mLs (125 mg total) by mouth 4 (four) times daily. 100 mL 0  . vitamin B-12 (CYANOCOBALAMIN) 250 MCG tablet Take 1 tablet (250 mcg total) by mouth every evening. 90 tablet 3   No current facility-administered medications for this visit.   Family History  Problem Relation Age of Onset  . Cancer Mother     Died from stomach cancer and "flesh eating rash  . Heart failure Father     Died in 23s from an MI  . Alcohol abuse Sister     Twin sister drinks a lot, as did both her parents and brothers  . Stroke Brother     Has 7 brothers, 1 with CVA  . Lupus Mother    Social History   Social History  . Marital Status: Divorced    Spouse Name: N/A  . Number of Children: N/A  . Years of Education: N/A   Social History Main Topics  . Smoking status: Former Smoker -- 0.50 packs/day for 40 years    Types: Cigarettes    Quit date: 09/20/2010  . Smokeless tobacco: Never Used  . Alcohol Use: 0.0 oz/week    0 Standard drinks or equivalent per week     Comment: 12/02/14 "graduated from [facility] (for alcohol abuse) in October 2015" ; 07/31/2015 "in alcohol rehab now"  . Drug Use: Yes    Special: Marijuana, Cocaine     Comment: 12/02/14 "last drug use was in 2012"  . Sexual Activity: Not Currently   Other Topics Concern  . Not on file   Social History Narrative   Lives with her significant other and 2 grandchildren. 1 child   Has 7 brothers and 4 sisters, 1 twin sister.   Unemployed, worked in [employer].    Abuses alcohol-drinks  1 glass of wine daily    No drug use. Former cigarette use quit 1.5 years ago.     11 th grade education            Review of Systems: Review of Systems  Constitutional: Positive for malaise/fatigue. Negative for fever, chills and weight  loss.  Respiratory: Negative for cough and shortness of breath.   Cardiovascular: Negative for chest pain and leg swelling.  Gastrointestinal: Positive for abdominal pain and diarrhea. Negative for vomiting and blood in stool.  Genitourinary: Negative for dysuria.  Neurological: Positive for weakness.     Objective:  Physical Exam: There were no vitals filed for this visit. Physical Exam  Constitutional: She is well-developed, well-nourished, and in no distress.  HENT:  Head: Normocephalic and atraumatic.  Mouth/Throat: Oropharynx is clear and moist.  Eyes: Conjunctivae are normal.  Cardiovascular: Regular rhythm.   Mild tachycardic  Pulmonary/Chest: Effort normal and breath sounds normal.  Abdominal: Soft. Bowel sounds are normal. There is tenderness (mild LUQ).  Musculoskeletal: She exhibits no edema.  Skin:  No tenting of skin  Nursing note and vitals reviewed.   Assessment & Plan:  Case discussed with Dr. Donaciano Eva  C. difficile diarrhea HPI: She did take Oral Vancomycin four times a day since her last hospital discharge on 3/4 and finished the course of 3/12.  She did not miss doses.  She did not appreciate any improvement in her diarrhea and still is having severe diarrhea.  She missed dialysis yesterday because "I was covered in shit" and is very weak due to her ongoing diarrhea.  She had to use the wheelchair today to get to her appointment.  A: C difficile diarrhea in patient with ESRD  P:   I would treat this as first recurrence of her C diff, if testing comes back positive for C diff and repeat a 14 day course of vancomycin.  Adherence must be stressed.   I obtained stat labs that show a mild AG metabolic acidosis, expected  elevated creatinine, and elevated bilirubin thus I do not feel comfortable managing her as an outpatient and will admit her to hopefully monitor her volume status, and restart antibiotics.  We may need to consider placement in a SNF for the duration of her C diff course. -Discussed with admitting resident Dr Naaman Plummer.  ESRD (end stage renal disease) on dialysis [facility]) HPI: Missed dialysis on Wednesday.  A: ESRD on dialysis  P: Patient to be admitted    Medications Ordered No orders of the defined types were placed in this encounter.   Other Orders Orders Placed This Encounter  Procedures  . CBC with Diff  . CMP w Anion Gap (STAT/Sunquest-performed on-site)   Follow Up: Return if symptoms worsen or fail to improve.

## 2015-08-19 NOTE — Telephone Encounter (Signed)
Walk-in Pt presents wanting to be seen today for diarrhea, she is not her usual upbeat self, sitting in wh/ch States she has missed several dialysis appts Ask why she missed and she stated she was not ready when it was time to go, maybe needs assistance? Will send this to shanag even though [facility] evidently is involved appt given for 1345, she was ask to go to ED and she refused, stated she wanted to be seen in clinic Taken to lobby, she has [employer] food with her and a soft drink appr 20 mins later she stopped kayeg. And ask to be taken upstairs, she was taken by triage nurse after she stated she would be leaving and coming back for appt though she was told if there was a no show at 1315 she would be moved up Took her to ED, no one was waiting on her, she ask to be left in the ED lobby and she would be back 1345 dr Genene Churn

## 2015-08-19 NOTE — H&P (Signed)
Date: 08/19/2015               Patient Name:  [name] [name] MRN: [id number]  DOB: 09-Aug-1955 Age / Sex: 60 y.o., female   PCP: Shela Leff, MD         Medical Service: Internal Medicine Teaching Service         Attending Physician: Dr. Annia Belt, MD    First Contact: Dr. Posey Pronto  Pager: [phone]  Second Contact: Dr. 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 She was recently admitted on 08/05/15 for C. Diff diarrhea and Influenza A. Patient states she has been taking vancomycin regularly since her discharge on 08/07/15 and finished the course of antibiotic 2-3 days ago. States she did not have any improvement in her diarrhea despite being compliant with the medication and still continues to have severe diarrhea which she describes as brown in color and mixed with white mucus-like material. Denies having any bloody diarrhea. Reports having mild occasional lower abdominal pain. States she does not have any nausea/ vomiting and continues to have adequate oral intake. However, does report feeling tired. States she was due for dialysis yesterday but could not go since she woke up covered in feces.   Meds: Current Facility-Administered Medications  Medication Dose Route Frequency Provider Last Rate Last Dose  . acetaminophen (TYLENOL) tablet 650 mg  650 mg Oral Q6H PRN Juluis Mire, MD       Or  . acetaminophen (TYLENOL) suppository 650 mg  650 mg Rectal Q6H PRN Marjan Rabbani, MD      . albuterol (PROVENTIL) (2.5 MG/3ML) 0.083% nebulizer solution 3 mL  3 mL Inhalation Q6H PRN Marjan Rabbani, MD      .  azaTHIOprine (IMURAN) tablet 50 mg  50 mg Oral Daily Marjan Rabbani, MD      . calcium carbonate (TUMS - dosed in mg elemental calcium) chewable tablet 200,000 mg of elemental calcium  1,000 tablet Oral QHS Marjan Rabbani, MD      . FLUoxetine (PROZAC) capsule 10 mg  10 mg Oral Daily Marjan Rabbani, MD      . folic acid (FOLVITE) tablet 1 mg  1 mg Oral Daily Marjan Rabbani, MD      . gabapentin (NEURONTIN) capsule 300 mg  300 mg Oral Daily Marjan Rabbani, MD      . hydroxychloroquine (PLAQUENIL) tablet 400 mg  400 mg Oral Daily Marjan Rabbani, MD      . lactobacillus (FLORANEX/LACTINEX) granules 1 g  1 g Oral TID Marjan Rabbani, MD      . levETIRAcetam (KEPPRA) tablet 250 mg  250 mg Oral Daily Marjan Rabbani, MD      . Derrill Memo ON 08/20/2015] lipase/protease/amylase (CREON) capsule 12,000 Units  12,000 Units Oral TID AC Marjan Rabbani, MD      . mirtazapine (REMERON) tablet 7.5 mg  7.5 mg Oral QHS Marjan Rabbani, MD      . multivitamin with minerals tablet 1 tablet  1 tablet Oral Daily Marjan Rabbani, MD      . ondansetron (ZOFRAN) tablet 4 mg  4  mg Oral Q6H PRN Juluis Mire, MD       Or  . ondansetron (ZOFRAN) injection 4 mg  4 mg Intravenous Q6H PRN Marjan Rabbani, MD      . pantoprazole (PROTONIX) EC tablet 40 mg  40 mg Oral Daily Marjan Rabbani, MD      . thiamine (VITAMIN B-1) tablet 100 mg  100 mg Oral Daily Marjan Rabbani, MD      . vancomycin (VANCOCIN) 50 mg/mL oral solution 125 mg  125 mg Oral 4 times per day Juluis Mire, MD        Allergies: Allergies as of 08/19/2015 - Review Complete 08/05/2015  Allergen Reaction Noted  . Amitriptyline hcl Swelling   . Doxycycline hyclate Itching 11/10/2010   Past Medical History  Diagnosis Date  . Anemia, B12 deficiency   . History of acute pancreatitis   . Right knee pain     No recent imaging on chart  . 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 Dr. Mancel Bale, [facility]    . Hypertriglyceridemia   . GERD (gastroesophageal reflux disease)   . Subdural hematoma ([place]) 02/2008    Likely 2/2 trauma from seizure from EtOH withdrawal, chronic in nature, sees Dr. Jerene Bears. Most recent CT head 10/2009 showing stable but persistent hematoma without mass effect.  . History of seizure disorder     Likely 2/2 alcohol abuse  . Hypocalcemia   . Hypomagnesemia   . Failure to thrive in childhood     Unclear etiology  . HTN (hypertension)   . Thrombocytopenia ([place])   . Hepatomegaly     On exam  . Joint pain   . Alcohol abuse   . Vitamin D deficiency   . Pancreatitis   . Insomnia   . Hyperlipidemia   . Pernicious anemia   . Macrocytic anemia   . 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 Depression   . Fx humeral neck 04/17/2011    Transverse fracture- minimally displaced- managed as outpatient   . ABNORMAL PAP SMEAR, LGSIL 07/23/2008    Annotation: HPV positive CIN I Dr. Mancel Bale, [facility] Qualifier: Diagnosis of  By: Oretha Ellis    . Pneumonia 05/20/2012  . Arthritis     "shoulders" (08/15/2013)  . 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  Marland Kitchen Chronic diastolic CHF (congestive heart failure) ([place])   . Hypomagnesemia   . Seizures ([place])     "don't know when/why I had them; daughter was always there w/me"  . On home oxygen therapy     "2L; 24/7" (07/31/2015)  . Shortness of breath dyspnea   . Pleural effusion   . Renal insufficiency    Past Surgical History  Procedure Laterality Date  . Cesarean section  1983  . Esophagogastroduodenoscopy  07/11/2011    Procedure: ESOPHAGOGASTRODUODENOSCOPY (EGD);  Surgeon: Beryle Beams, MD;  Location: [facility];  Service: Endoscopy;  Laterality: N/A;  . Colonoscopy  07/11/2011    Procedure: COLONOSCOPY;  Surgeon: Beryle Beams, MD;  Location: [facility] ENDOSCOPY;  Service: Endoscopy;  Laterality: N/A;  . Eye surgery Left     "trauma"  . Right colectomy  08/28/2011  .  Esophagogastroduodenoscopy N/A 12/01/2012    Procedure: ESOPHAGOGASTRODUODENOSCOPY (EGD);  Surgeon: Irene Shipper, MD;  Location: [facility];  Service: Endoscopy;  Laterality: N/A;  . Colonoscopy with esophagogastroduodenoscopy (  egd) Left 08/21/2013    Procedure: COLONOSCOPY WITH ESOPHAGOGASTRODUODENOSCOPY (EGD);  Surgeon: Beryle Beams, MD;  Location[facility] END[facility]COPY;  Service: Endoscopy;  Laterality: Left;  . Subxyphoid pericardial window N/A 12/04/2013    Procedure: SUBXYPHOID PERICARDIAL WINDOW WITH TEE;  Surgeon: Ivin Poot, MD;  Location: [facility];  Service: Thoracic;  Laterality: N/A;  . Intraoperative transesophageal echocardiogram N/A 12/04/2013    Procedure: INTRAOPERATIVE TRANSESOPHAGEAL ECHOCARDIOGRAM;  Surgeon: Ivin Poot, MD;  Location: [facility];  Service: Open Heart Surgery;  Laterality: N/A;  . Exchange of a dialysis catheter Left 10/22/2014    Procedure: EXCHANGE OF A DIALYSIS CATHETER;  Surgeon: Conrad Pocahontas, MD;  Location: [facility];  Service: Vascular;  Laterality: Left;  . Av fistula placement Left 12/03/2014    Procedure: INSERTION OF ARTERIOVENOUS (AV) GORE-TEX GRAFT ARM;  Surgeon: Mal Misty, MD;  Location: [facility] OR;  Service: Vascular;  Laterality: Left;   Family History  Problem Relation Age of Onset  . Cancer Mother     Died from stomach cancer and "flesh eating rash  . Heart failure Father     Died in 53s from an MI  . Alcohol abuse Sister     Twin sister drinks a lot, as did both her parents and brothers  . Stroke Brother     Has 7 brothers, 1 with CVA  . Lupus Mother    Social History   Social History  . Marital Status: Divorced    Spouse Name: N/A  . Number of Children: N/A  . Years of Education: N/A   Occupational History  . Not on file.   Social History Main Topics  . Smoking status: Former Smoker -- 0.50 packs/day for 40 years    Types: Cigarettes    Quit date: 09/20/2010  . Smokeless tobacco: Never Used  . Alcohol Use: 0.0 oz/week    0 Standard  drinks or equivalent per week     Comment: 12/02/14 "graduated from [place] (for alcohol abuse) in October 2015" ; 07/31/2015 "in alcohol rehab now"  . Drug Use: Yes    Special: Marijuana, Cocaine     Comment: 12/02/14 "last drug use was in 2012"  . Sexual Activity: Not Currently   Other Topics Concern  . Not on file   Social History Narrative   Lives with her significant other and 2 grandchildren. 1 child   Has 7 brothers and 4 sisters, 1 twin sister.   Unemployed, worked in [employer].    Abuses alcohol-drinks 1 glass of wine daily    No drug use. Former cigarette use quit 1.5 years ago.     11 th grade education             Review of Systems: Review of Systems  Constitutional: Positive for chills and malaise/fatigue. Negative for fever.  HENT: Negative for congestion and sore throat.   Eyes: Negative for blurred vision and pain.  Respiratory: Negative for cough, shortness of breath and wheezing.   Cardiovascular: Negative for chest pain, palpitations and leg swelling.  Gastrointestinal: Positive for abdominal pain and diarrhea. Negative for nausea, vomiting and blood in stool.  Musculoskeletal: Negative for myalgias.  Skin: Negative for rash.  Neurological: Negative for dizziness, sensory change and focal weakness.    Physical Exam: Blood pressure 117/78, pulse 76, temperature 97.5 F (36.4 C), temperature source Oral, resp. rate 18, height 5\' 1"  (1.549 m), weight 38.6 kg (85 lb 1.6 oz), SpO2 97 %. Physical  Exam  Constitutional: She is oriented to person, place, and time. No distress.  Cachetic african-american female lying comfortably in hospital bed.   HENT:  Head: Normocephalic and atraumatic.  Mouth/Throat: Oropharynx is clear and moist.  Eyes: EOM are normal.  Neck: Neck supple. No tracheal deviation present.  Cardiovascular: Normal rate and regular rhythm.  Exam reveals no gallop and no friction rub.   Murmur heard. Grade 3/5 systolic murmur best  appreciated on the left upper sternal border.  Distal pulses 1+  Pulmonary/Chest: Effort normal and breath sounds normal. No respiratory distress. She has no wheezes. She has no rales.  Abdominal: Soft. Bowel sounds are normal. She exhibits no distension. There is no tenderness. There is no rebound and no guarding.  Musculoskeletal: She exhibits no edema.  Neurological: She is alert and oriented to person, place, and time.  Skin: Skin is warm and dry. She is not diaphoretic.  Tenting of skin appreciated on the dorsum of hands.     Lab results: Basic Metabolic Panel:  Recent Labs  08/19/15 1454  NA 131*  K 4.4  CL 96*  CO2 17*  GLUCOSE 137*  BUN 6  CREATININE 4.08*  CALCIUM 7.7*   Liver Function Tests:  Recent Labs  08/19/15 1454  AST 78*  ALT 13*  ALKPHOS 183*  BILITOT 6.0*  PROT 7.4  ALBUMIN 1.3*   CBC:  Recent Labs  08/19/15 1454  WBC 7.2  NEUTROABS 4.8  HGB 10.9*  HCT 32.2*  MCV 105.2*  PLT 44*   Urine Drug Screen: Drugs of Abuse     Component Value Date/Time   LABOPIA NONE DETECTED 07/16/2015 1400   LABOPIA NEG 09/18/2011 0936   COCAINSCRNUR NONE DETECTED 07/16/2015 1400   COCAINSCRNUR NEG 09/18/2011 0936   LABBENZ NONE DETECTED 07/16/2015 1400   LABBENZ NEG 09/18/2011 0936   LABBENZ NEG 04/10/2011 1130   AMPHETMU NONE DETECTED 07/16/2015 1400   AMPHETMU NEG 09/18/2011 0936   AMPHETMU NEG 04/10/2011 1130   THCU NONE DETECTED 07/16/2015 1400   THCU NEG 09/18/2011 0936   LABBARB NONE DETECTED 07/16/2015 1400   LABBARB NEG 09/18/2011 0936    Imaging results:  No results found.  Assessment & Plan by Problem: Active Problems:   Recurrent Clostridium difficile diarrhea  Recurrent C. diff diarrhea  Patient reports being compliant with vancomycin since her last hospital admission. As such, would treat this as first recurrence of C. Diff. Labs showing an elevated anion gap (18) and low bicarb (17) consistent with an elevated anion gap metabolic  acidosis.  -PO Vancomycin  -Check C. Diff PCR -Tylenol 650 mg prn mild pain  -Continue lactobacillus 1 g TID -Encourage PO intake as patient appears dry on exam.  -PT/ OT consult  -Consider placement in a SNF for the duration of her C. Diff course.   ESRD on HD SCr 4.08 and GFR 13. Missed dialysis on Wednesday. Na low 131. Does not appear fluid overloaded on exam - lungs clear and no LE edema.  -Will speak to nephrology as patient needs to be dialysed in am   -F/u am labs: mag, renal function panel   Hx of thrombocytopenia  Platelets 44,000 on admission. Thrombocytopenia likely in the setting of chronic diseases and bone marrow suppression. No signs of bleeding.  -Continue to monitor  SLE -Continue Plaquenil 400 mg daily  -Continue Azathiopine 50 mg daily   Chronic pancreatitis -Continue Creon 12,000 units TID  Hx of pernicious anemia No prior IF antibody. B12  level high. -Check IF antibody   COPD -Albuterol nebulizer prn   Hx of alcohol abuse  LFTS showing ALP 183, Albumin 1.2, AST 78, ALT 13, and Tbili 6.0. RUQ ultrasound from 07/2015 showing 2 cm sludge ball within GB. Study negative for stones or cholelithiasis.   -Check fractionated bilirubin   -Continue Thiamine, multivitamins, folic acid  -F/u APTT and INR in am   Hx of seizures -Continue keppra 250 mg daily   GERD -Continue Protonix 40 mg daily   Insomnia -Continue Mirtazapine 7.5 mg daily   Depression -Continue Prozac 10 mg daily -Continue Mirtazapine 7.5 mg daily   Diet: renal   Code: Full (confirmed with patient)   Dispo: Disposition is deferred at this time, awaiting improvement of current medical problems. Anticipated discharge in approximately 1-2 day(s).   The patient does have a current PCP Shela Leff, MD) and does need an [facility] hospital follow-up appointment after discharge.  The patient does not have transportation limitations that hinder transportation to clinic  appointments.  Signed: Shela Leff, MD 08/19/2015, 8:01 PM

## 2015-08-19 NOTE — Progress Notes (Signed)
Verbal order from MD for renal diet

## 2015-08-20 ENCOUNTER — Other Ambulatory Visit: Payer: Self-pay | Admitting: *Deleted

## 2015-08-20 ENCOUNTER — Other Ambulatory Visit: Payer: Self-pay | Admitting: Pharmacist

## 2015-08-20 DIAGNOSIS — A0472 Enterocolitis due to Clostridium difficile, not specified as recurrent: Secondary | ICD-10-CM

## 2015-08-20 DIAGNOSIS — K7460 Unspecified cirrhosis of liver: Secondary | ICD-10-CM | POA: Diagnosis present

## 2015-08-20 LAB — RENAL FUNCTION PANEL
Albumin: 1.2 g/dL — ABNORMAL LOW (ref 3.5–5.0)
Anion gap: 12 (ref 5–15)
BUN: 8 mg/dL (ref 6–20)
CHLORIDE: 97 mmol/L — AB (ref 101–111)
CO2: 19 mmol/L — AB (ref 22–32)
CREATININE: 4.4 mg/dL — AB (ref 0.44–1.00)
Calcium: 7.5 mg/dL — ABNORMAL LOW (ref 8.9–10.3)
GFR calc Af Amer: 12 mL/min — ABNORMAL LOW (ref >60)
GFR, EST NON AFRICAN AMERICAN: 10 mL/min — AB (ref >60)
GLUCOSE: 100 mg/dL — AB (ref 65–99)
POTASSIUM: 4.8 mmol/L (ref 3.5–5.1)
Phosphorus: 2.5 mg/dL (ref 2.5–4.6)
SODIUM: 128 mmol/L — AB (ref 135–145)

## 2015-08-20 LAB — GASTROINTESTINAL PANEL BY PCR, STOOL (REPLACES STOOL CULTURE)
ADENOVIRUS F40/41: NOT DETECTED
Astrovirus: NOT DETECTED
CAMPYLOBACTER SPECIES: NOT DETECTED
CRYPTOSPORIDIUM: NOT DETECTED
CYCLOSPORA CAYETANENSIS: NOT DETECTED
E. coli O157: NOT DETECTED
ENTEROAGGREGATIVE E COLI (EAEC): NOT DETECTED
ENTEROPATHOGENIC E COLI (EPEC): NOT DETECTED
Entamoeba histolytica: NOT DETECTED
Enterotoxigenic E coli (ETEC): NOT DETECTED
GIARDIA LAMBLIA: NOT DETECTED
Norovirus GI/GII: NOT DETECTED
PLESIMONAS SHIGELLOIDES: NOT DETECTED
Rotavirus A: NOT DETECTED
Salmonella species: NOT DETECTED
Sapovirus (I, II, IV, and V): NOT DETECTED
Shiga like toxin producing E coli (STEC): NOT DETECTED
Shigella/Enteroinvasive E coli (EIEC): NOT DETECTED
VIBRIO SPECIES: NOT DETECTED
Vibrio cholerae: NOT DETECTED
YERSINIA ENTEROCOLITICA: NOT DETECTED

## 2015-08-20 LAB — C DIFFICILE QUICK SCREEN W PCR REFLEX
C Diff antigen: POSITIVE — AB
C Diff interpretation: POSITIVE
C Diff toxin: POSITIVE — AB

## 2015-08-20 LAB — CBC WITH DIFFERENTIAL/PLATELET
BASOS ABS: 0.1 10*3/uL (ref 0.0–0.1)
Basophils Relative: 1 %
EOS ABS: 0 10*3/uL (ref 0.0–0.7)
Eosinophils Relative: 0 %
HCT: 26.3 % — ABNORMAL LOW (ref 36.0–46.0)
HEMOGLOBIN: 8.6 g/dL — AB (ref 12.0–15.0)
LYMPHS ABS: 1.8 10*3/uL (ref 0.7–4.0)
Lymphocytes Relative: 21 %
MCH: 33.7 pg (ref 26.0–34.0)
MCHC: 32.7 g/dL (ref 30.0–36.0)
MCV: 103.1 fL — ABNORMAL HIGH (ref 78.0–100.0)
MONO ABS: 1.5 10*3/uL — AB (ref 0.1–1.0)
MONOS PCT: 17 %
NEUTROS ABS: 5.4 10*3/uL (ref 1.7–7.7)
Neutrophils Relative %: 61 %
Platelets: 51 10*3/uL — ABNORMAL LOW (ref 150–400)
RBC: 2.55 MIL/uL — AB (ref 3.87–5.11)
RDW: 21.5 % — AB (ref 11.5–15.5)
WBC: 8.8 10*3/uL (ref 4.0–10.5)

## 2015-08-20 LAB — HEPATIC FUNCTION PANEL
ALK PHOS: 167 U/L — AB (ref 38–126)
ALT: 18 U/L (ref 14–54)
AST: 74 U/L — ABNORMAL HIGH (ref 15–41)
Albumin: 1.2 g/dL — ABNORMAL LOW (ref 3.5–5.0)
BILIRUBIN DIRECT: 3.5 mg/dL — AB (ref 0.1–0.5)
BILIRUBIN INDIRECT: 2.5 mg/dL — AB (ref 0.3–0.9)
BILIRUBIN TOTAL: 6 mg/dL — AB (ref 0.3–1.2)
Total Protein: 7.1 g/dL (ref 6.5–8.1)

## 2015-08-20 LAB — MAGNESIUM: MAGNESIUM: 1.3 mg/dL — AB (ref 1.7–2.4)

## 2015-08-20 MED ORDER — DARBEPOETIN ALFA 200 MCG/0.4ML IJ SOSY
200.0000 ug | PREFILLED_SYRINGE | INTRAMUSCULAR | Status: DC
  Administered 2015-08-20: 200 ug via INTRAVENOUS
  Filled 2015-08-20: qty 0.4

## 2015-08-20 MED ORDER — ENSURE ENLIVE PO LIQD
237.0000 mL | Freq: Three times a day (TID) | ORAL | Status: DC
  Administered 2015-08-21 – 2015-09-02 (×9): 237 mL via ORAL

## 2015-08-20 MED ORDER — CALCIUM CARBONATE ANTACID 500 MG PO CHEW
400.0000 mg | CHEWABLE_TABLET | Freq: Every day | ORAL | Status: DC
  Administered 2015-08-20 – 2015-08-22 (×3): 400 mg via ORAL
  Filled 2015-08-20 (×4): qty 2

## 2015-08-20 MED ORDER — VANCOMYCIN 50 MG/ML ORAL SOLUTION: 125.0000 mg | Freq: Four times a day (QID) | ORAL | Status: DC

## 2015-08-20 MED ORDER — DARBEPOETIN ALFA 200 MCG/0.4ML IJ SOSY: 200.0000 ug | PREFILLED_SYRINGE | INTRAMUSCULAR | Status: DC

## 2015-08-20 MED ORDER — VANCOMYCIN 50 MG/ML ORAL SOLUTION: 125.0000 mg | ORAL | Status: DC

## 2015-08-20 MED ORDER — VANCOMYCIN 50 MG/ML ORAL SOLUTION: 125.0000 mg | Freq: Every day | ORAL | Status: DC

## 2015-08-20 MED ORDER — CALCITRIOL 0.25 MCG PO CAPS
1.2500 ug | ORAL_CAPSULE | ORAL | Status: DC
  Administered 2015-08-20 – 2015-08-23 (×3): 1.25 ug via ORAL
  Filled 2015-08-20 (×5): qty 5

## 2015-08-20 MED ORDER — VANCOMYCIN 50 MG/ML ORAL SOLUTION: 125.0000 mg | Freq: Two times a day (BID) | ORAL | Status: DC

## 2015-08-20 MED ORDER — CALCITRIOL 0.25 MCG PO CAPS: 1.2500 ug | ORAL_CAPSULE | ORAL | Status: DC

## 2015-08-20 MED ORDER — VANCOMYCIN 50 MG/ML ORAL SOLUTION
125.0000 mg | Freq: Four times a day (QID) | ORAL | Status: DC
  Administered 2015-08-20 – 2015-08-22 (×8): 125 mg via ORAL
  Filled 2015-08-20 (×3): qty 2.5
  Filled 2015-08-20: qty 3
  Filled 2015-08-20 (×3): qty 2.5
  Filled 2015-08-20: qty 3
  Filled 2015-08-20 (×5): qty 2.5

## 2015-08-20 MED ORDER — CALCITRIOL 0.25 MCG PO CAPS: 1.2500 ug | ORAL_CAPSULE | Freq: Once | ORAL | Status: DC

## 2015-08-20 MED ORDER — DARBEPOETIN ALFA 200 MCG/0.4ML IJ SOSY
200.0000 ug | PREFILLED_SYRINGE | Freq: Once | INTRAMUSCULAR | Status: DC
  Filled 2015-08-20: qty 0.4

## 2015-08-20 MED ORDER — DARBEPOETIN ALFA 200 MCG/0.4ML IJ SOSY
PREFILLED_SYRINGE | INTRAMUSCULAR | Status: AC
  Filled 2015-08-20: qty 0.4

## 2015-08-20 MED ORDER — FIDAXOMICIN 200 MG PO TABS
200.0000 mg | ORAL_TABLET | Freq: Two times a day (BID) | ORAL | Status: DC
  Administered 2015-08-20: 200 mg via ORAL
  Filled 2015-08-20: qty 1

## 2015-08-20 MED FILL — VANCOMYCIN 50MG/ML ORAL SOL: 50MG/1ML | 14 days supply | Qty: 140 | Fill #0

## 2015-08-20 NOTE — Progress Notes (Addendum)
Pt's heart is in the 130's, temp between 99.2 and 100.6, BP in the 90's. Pt says too dizzy to stand up to complete orthostatic Vitals. MD notified. Will continue to monitor. Tylenol for fever greater than 101.  Pt does not have IV access. IV team attempted 4 times. MD aware.

## 2015-08-20 NOTE — Procedures (Signed)
Tolerating hemodialysis without hemiodynamic issues at this time. [name] [name] C

## 2015-08-20 NOTE — Progress Notes (Signed)
OT Cancellation Note  Patient Details Name: [name] [name] MRN: [id number] DOB: 05-01-1956   Cancelled Treatment:    Reason Eval/Treat Not Completed: Medical issues which prohibited therapy. Will check back.  Pt with decreased BP and unable to complete orthostatic vitals this am with RN.    [name] 08/20/2015, 7:58 AM  Lesle Chris, OTR/L [phone] 08/20/2015

## 2015-08-20 NOTE — Telephone Encounter (Signed)
Thank you.  [facility] aware of pt's admission and pt to be seen by liaison and receive transition of care services upon discharge.  Please refer to CSW at [facility] hospital follow up appointment.

## 2015-08-20 NOTE — Progress Notes (Signed)
CDIFF resulted postiive. MD paged.

## 2015-08-20 NOTE — Progress Notes (Signed)
Subjective: Patient states she is doing well this morning. She says she was adherent to her oral Vancomycin since discharge from the hospital earlier this month and had continued diarrhea without any break while at home. She says her last episode was yesterday and denies any diarrhea this morning. She denies any abdominal pain. Her BP tends to run on the soft side, but it is lower than usual today and she is tachycardic. She also has an elevated temperature with Tmax of 101.6 rectally. IV access was unable to be obtained, so she has not received any IV fluids, but is tolerating oral hydration.  Objective: Vital signs in last 24 hours: Filed Vitals:   08/20/15 0813 08/20/15 0917 08/20/15 0942 08/20/15 1110  BP: 101/48   91/42  Pulse: 127 126 121 109  Temp:   100.5 F (38.1 C)   TempSrc:   Rectal   Resp: 20   20  Height:      Weight:      SpO2: 97% 99%  100%   Weight change:   Intake/Output Summary (Last 24 hours) at 08/20/15 1357 Last data filed at 08/20/15 0630  Gross per 24 hour  Intake      0 ml  Output    250 ml  Net   -250 ml   General: cachectic, ill appearing woman, resting in bed Cardiac: tachycardic, regular rhythm Pulm: clear to auscultation bilaterally, moving normal volumes of air Abd: soft, nontender, nondistended, BS present Ext: thin extremities, no pedal edema   Assessment/Plan: Active Problems:   Hyperparathyroidism, secondary renal (HCC)   Depression   GERD   Chronic pancreatitis (HCC)   Seizure disorder (HCC)   Severe protein-calorie malnutrition (HCC)   Anemia of chronic disease   Folate deficiency   Lupus (systemic lupus erythematosus) (HCC)   ESRD (end stage renal disease) on dialysis (HCC)   Recurrent Clostridium difficile diarrhea   B12 deficiency  Persistent C. Difficile diarrhea: Patient complains of continued diarrhea despite adherence and completion of oral Vancomycin course. She says her last episode of diarrhea was yesterday, denies any  stool today. She has a fever with Tmax 101.6 today and tachycardia. Her WBC is 8.6 in the setting of immunosuppression. C diff is again positive for antigen and toxin. Have discussed with ID with suggestion for Vancomycin taper as treatment option for her persistent C diff with follow up in ID clinic outpatient. -Start Vancomycin taper -Will need outpatient ID appointment -Continue oral hydration -May need SNF on discharge -Continue lactobacillus 1 g TID -f/u blood cultures  ESRD on MWF HD: Missed dialysis on Wednesday. Does not appear fluid overloaded on exam - lungs clear and no LE edema, butNa low at 128. IV access was unable to be obtained for IV fluid resuscitation. Nephrology following with plan for dialysis today. -HD per nephrology  SLE -Continue Plaquenil 400 mg daily  -Continue Azathiopine 50 mg daily   Liver disease: CT abdomen on 07/30/15 with changes suspicious for cirrhosis. Her aPTT is elevated at 41. Total bilirubin is 6.0, direct 3.5, indirect 2.5, Alk phos 167, AST 74, ALt 18. She is also thrombocytopenic at 51K, which is improved from 29K on last admission.  Chronic pancreatitis -Continue Creon 12,000 units TID  Depression -Continue Prozac 10 mg daily -Continue Mirtazapine 7.5 mg daily    Dispo: Disposition is deferred at this time, awaiting improvement of current medical problems.  Anticipated discharge in approximately 1-3 day(s).     LOS: 1 day   [place]  Posey Pronto, MD 08/20/2015, 1:57 PM

## 2015-08-20 NOTE — Progress Notes (Signed)
PT Cancellation Note  Patient Details Name: [name] [name] MRN: [id number] DOB: 1955/10/06   Cancelled Treatment:    Reason Eval/Treat Not Completed: Medical issues which prohibited therapy (per chart, patient with elevated resting HR, febrile, and too dizzy to complete orthostatics with nsg this am, will follow up later today if medically appropriate)   Duncan Dull 08/20/2015, 7:35 AM Alben Deeds, PT DPT  [phone]

## 2015-08-20 NOTE — Progress Notes (Signed)
Expressed concerns to MD about patient's vital signs and asked if she needs a lactic lab draw. MD stated they did not need a lactic acid and patient will be going to HDU today where they can give fluids.

## 2015-08-20 NOTE — Consult Note (Signed)
   [facility] CM Inpatient Consult   08/20/2015  [name] [name] 07/26/1955 [id number] Patient is active with [facility] services for frequent admissions, Medication management needs with assistance.  Came by to see the patient and she is currently off the nursing unit. Inpatient RNCM notified of [facility] following. Patient will receive post hospital monitoring and follow up.  For questions, please contact:  Natividad Brood, RN BSN [facility] Liaison  [phone] business mobile phone Toll free office [phone]

## 2015-08-20 NOTE — Patient Outreach (Signed)
Noted that member readmitted to hospital with C. Diff and End Stage Renal Disease, missing several dialysis sessions.  Hospital liaisons notified of admission, will restart transition of care program upon discharge.  [name], BSN, [facility]  [facility] Care Manager [phone]

## 2015-08-20 NOTE — Progress Notes (Signed)
PT Cancellation Note  Patient Details Name: MIKENZY YAO MRN: [id number] DOB: [birth year]   Cancelled Treatment:    Reason Eval/Treat Not Completed: Patient at procedure or test/unavailable at HD   Duncan Dull 08/20/2015, 4:42 PM Alben Deeds, [place] DPT  [phone]

## 2015-08-20 NOTE — Progress Notes (Signed)
TOC support Vanco access per ID and internal medicine

## 2015-08-20 NOTE — Progress Notes (Signed)
MD paged to make aware of rectal temp 101.6 and HR 127. BP 101/48. MD also made aware patient has no IV access. Order to recheck temperature and HR following tylenol administration and that they will come assess patient.

## 2015-08-20 NOTE — Consult Note (Signed)
[facility] Renal Consultation Note    Indication for Consultation:  Management of ESRD/hemodialysis; anemia, hypertension/volume and secondary hyperparathyroidism PCP:  HPI: [name] [name] is a severely malnourished 60 y.o. female with ESRD, lupus, HTN, CHF, hx seizures, etoh abuse, chronic pancreatitis, recent influenza A who is admitted with recurrent Cdiff despite completing her course of antibiotics several days ago.  She has continued to have dialysis, though she was above her EDW Monday at dialysis.  She did not come to dialysis Wednesday due to diarrhea.  She has not had any N or V but appetite is poor. She makes urine.  She is weak and has been febrile.. New since last admission is continue increase in Bilirubin from 2.3 to 1.5 last admission, now up to 5.9 - 6 LFTs similar to last admission AST 60 - 70 ALT teens- alkphos lower than last admission 167  Patient's daughter is at bedside and angry that no one is telling her anything, that we haven't dialyzed her, that the dialysis unit called her home yesterday asking why she wasn't at dialysis when she was at the hospital.  Past Medical History  Diagnosis Date  . Anemia, B12 deficiency   . History of acute pancreatitis   . Right knee pain     No recent imaging on chart  . 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 Dr. Mancel Bale, [facility]  . Hypertriglyceridemia   . GERD (gastroesophageal reflux disease)   . Subdural hematoma ([place]) 02/2008    Likely 2/2 trauma from seizure from EtOH withdrawal, chronic in nature, sees Dr. Jerene Bears. Most recent CT head 10/2009 showing stable but persistent hematoma without mass effect.  . History of seizure disorder     Likely 2/2 alcohol abuse  . Hypocalcemia   . Hypomagnesemia   . Failure to thrive in childhood     Unclear etiology  . HTN (hypertension)   . Thrombocytopenia ([place])   . Hepatomegaly     On exam  .  Joint pain   . Alcohol abuse   . Vitamin D deficiency   . Pancreatitis   . Insomnia   . Hyperlipidemia   . Pernicious anemia   . Macrocytic anemia   . 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 Depression   . Fx humeral neck 04/17/2011    Transverse fracture- minimally displaced- managed as outpatient   . ABNORMAL PAP SMEAR, LGSIL 07/23/2008    Annotation: HPV positive CIN I Dr. Mancel Bale, [facility] Qualifier: Diagnosis of  By: Oretha Ellis    . Pneumonia 05/20/2012  . Arthritis     "shoulders" (08/15/2013)  . 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  Marland Kitchen Chronic diastolic CHF (congestive heart failure) ([place])   . Hypomagnesemia   . Seizures ([place])     "don't know when/why I had them; daughter was always there w/me"  . On home oxygen therapy     "2L; 24/7" (07/31/2015)  . Shortness of breath dyspnea   . Pleural effusion   . Renal insufficiency    Past Surgical History  Procedure Laterality Date  . Cesarean section  1983  . Esophagogastroduodenoscopy  07/11/2011    Procedure: ESOPHAGOGASTRODUODENOSCOPY (EGD);  Surgeon: Beryle Beams, MD;  Location: [facility];  Service: Endoscopy;  Laterality: N/A;  . Colonoscopy  07/11/2011    Procedure: COLONOSCOPY;  Surgeon:  Beryle Beams, MD;  Location: [facility];  Service: Endoscopy;  Laterality: N/A;  . Eye surgery Left     "trauma"  . Right colectomy  08/28/2011  . Esophagogastroduodenoscopy N/A 12/01/2012    Procedure: ESOPHAGOGASTRODUODENOSCOPY (EGD);  Surgeon: Irene Shipper, MD;  Location: [facility];  Service: Endoscopy;  Laterality: N/A;  . Colonoscopy with esophagogastroduodenoscopy (egd) Left 08/21/2013    Procedure: COLONOSCOPY WITH ESOPHAGOGASTRODUODENOSCOPY (EGD);  Surgeon: Beryle Beams, MD;  Location: [facility]COPY;  Service: Endoscopy;  Laterality: Left;  . Subxyphoid pericardial window N/A 12/04/2013    Procedure: SUBXYPHOID PERICARDIAL WINDOW WITH TEE;  Surgeon: Ivin Poot, MD;  Location: [facility];  Service: Thoracic;  Laterality: N/A;  . Intraoperative transesophageal echocardiogram N/A 12/04/2013    Procedure: INTRAOPERATIVE TRANSESOPHAGEAL ECHOCARDIOGRAM;  Surgeon: Ivin Poot, MD;  Location: [facility];  Service: Open Heart Surgery;  Laterality: N/A;  . Exchange of a dialysis catheter Left 10/22/2014    Procedure: EXCHANGE OF A DIALYSIS CATHETER;  Surgeon: Conrad Geneva, MD;  Location: [facility];  Service: Vascular;  Laterality: Left;  . Av fistula placement Left 12/03/2014    Procedure: INSERTION OF ARTERIOVENOUS (AV) GORE-TEX GRAFT ARM;  Surgeon: Mal Misty, MD;  Location: [facility] OR;  Service: Vascular;  Laterality: Left;   Family History  Problem Relation Age of Onset  . Cancer Mother     Died from stomach cancer and "flesh eating rash  . Heart failure Father     Died in 74s from an MI  . Alcohol abuse Sister     Twin sister drinks a lot, as did both her parents and brothers  . Stroke Brother     Has 7 brothers, 1 with CVA  . Lupus Mother    Social History:  reports that she quit smoking about 4 years ago. Her smoking use included Cigarettes. She has a 20 pack-year smoking history. She has never used smokeless tobacco. She reports that she drinks alcohol. She reports that she uses illicit drugs (Marijuana and Cocaine). Allergies  Allergen Reactions  . Amitriptyline Hcl Swelling    In the face.  . Doxycycline Hyclate Itching    Feels like something crawling under her skin   Prior to Admission medications   Medication Sig Start Date End Date Taking? Authorizing Provider  albuterol (PROAIR HFA) 108 (90 BASE) MCG/ACT inhaler Inhale 1-2 puffs into the lungs every 6 (six) hours as needed for wheezing or shortness of breath. 01/14/14   Jones Bales, MD  azaTHIOprine (IMURAN) 50 MG tablet Take 50 mg by mouth daily. 06/29/15   Historical Provider, MD  calcium carbonate (TUMS - DOSED IN MG ELEMENTAL CALCIUM) 500 MG chewable tablet Chew 1,000 tablets by mouth  at bedtime.    Historical Provider, MD  camphor-menthol Timoteo Ace) lotion Apply 1 application topically daily as needed for itching.    Historical Provider, MD  cetirizine (ZYRTEC) 10 MG tablet Take 1 tablet (10 mg total) by mouth daily as needed for allergies. 06/11/15   Shela Leff, MD  CREON 12000 UNITS CPEP capsule Take 1 capsule (12,000 Units total) by mouth 3 (three) times daily before meals. 01/21/15   Shela Leff, MD  Darbepoetin Alfa (ARANESP) 150 MCG/0.3ML SOSY injection Inject 0.3 mLs (150 mcg total) into the vein every Wednesday with hemodialysis. 08/04/15   Iline Oven, MD  FLUoxetine (PROZAC) 10 MG capsule Take 1 capsule (10 mg total) by mouth daily. For depression 01/21/15   Shela Leff, MD  folic acid (FOLVITE) 1 MG tablet Take 1 tablet (1 mg total) by mouth daily. For folic acid replacement 06/08/15   Shela Leff, MD  gabapentin (NEURONTIN) 300 MG capsule Take 1 capsule (300 mg total) by mouth daily. 04/09/15 04/06/16  Shela Leff, MD  hydroxychloroquine (PLAQUENIL) 200 MG tablet Take 400 mg by mouth daily.    Historical Provider, MD  Lactobacillus (ACIDOPHILUS PROBIOTIC PO) Take 2 mg by mouth 3 (three) times daily. Take 2 (two) tablets by mouth three times daily    Historical Provider, MD  levETIRAcetam (KEPPRA) 250 MG tablet Take 1 tablet (250 mg total) by mouth daily. Take additional 500mg  (2 tablets) after Dialysis treatments on Monday, Wednesday, Friday 07/16/15   Shary Decamp, PA-C  mirtazapine (REMERON) 7.5 MG tablet Take 1 tablet (7.5 mg total) by mouth at bedtime. 01/21/15   Shela Leff, MD  Multiple Vitamin (MULTIVITAMIN WITH MINERALS) TABS tablet Take 1 tablet by mouth daily. For vitamin replacement 03/24/15   Shela Leff, MD  omeprazole (PRILOSEC) 40 MG capsule Take 1 capsule (40 mg total) by mouth daily. 01/21/15   Shela Leff, MD  thiamine (VITAMIN B-1) 100 MG tablet Take 1 tablet (100 mg total) by mouth daily. For low thiamine 01/21/15    Shela Leff, MD  vancomycin (VANCOCIN) 50 mg/mL oral solution Take 2.5 mLs (125 mg total) by mouth 4 (four) times daily. 08/02/15   Iline Oven, MD  vitamin B-12 (CYANOCOBALAMIN) 250 MCG tablet Take 1 tablet (250 mcg total) by mouth every evening. 01/21/15   Shela Leff, MD   Current Facility-Administered Medications  Medication Dose Route Frequency Provider Last Rate Last Dose  . acetaminophen (TYLENOL) tablet 650 mg  650 mg Oral Q6H PRN Juluis Mire, MD   650 mg at 08/20/15 [id number]   Or  . acetaminophen (TYLENOL) suppository 650 mg  650 mg Rectal Q6H PRN Marjan Rabbani, MD      . albuterol (PROVENTIL) (2.5 MG/3ML) 0.083% nebulizer solution 3 mL  3 mL Inhalation Q6H PRN Marjan Rabbani, MD      . azaTHIOprine (IMURAN) tablet 50 mg  50 mg Oral Daily Marjan Rabbani, MD   50 mg at 08/19/15 2126  . calcitRIOL (ROCALTROL) capsule 1.25 mcg  1.25 mcg Oral Q M,W,F-HD Alric Seton, PA-C   1.25 mcg at 08/20/15 [id number]  . calcium carbonate (TUMS - dosed in mg elemental calcium) chewable tablet 400 mg of elemental calcium  400 mg of elemental calcium Oral QHS Annia Belt, MD      . Darbepoetin Alfa (ARANESP) injection 200 mcg  200 mcg Intravenous Q Fri-HD Alric Seton, PA-C      . FLUoxetine (PROZAC) capsule 10 mg  10 mg Oral Daily Marjan Rabbani, MD   10 mg at 08/20/15 0936  . folic acid (FOLVITE) tablet 1 mg  1 mg Oral Daily Marjan Rabbani, MD   1 mg at 08/20/15 0938  . gabapentin (NEURONTIN) capsule 300 mg  300 mg Oral Daily Marjan Rabbani, MD   300 mg at 08/20/15 0937  . hydroxychloroquine (PLAQUENIL) tablet 400 mg  400 mg Oral Daily Marjan Rabbani, MD   400 mg at 08/20/15 0937  . lactobacillus (FLORANEX/LACTINEX) granules 1 g  1 g Oral TID Juluis Mire, MD   1 g at 08/20/15 0939  . levETIRAcetam (KEPPRA) tablet 250 mg  250 mg Oral Daily Marjan Rabbani, MD   250 mg at 08/20/15 0937  . lipase/protease/amylase (CREON) capsule 12,000 Units  12,000 Units Oral TID AC Marjan Rabbani,  MD   12,000 Units at 08/20/15 [id number]  . mirtazapine (REMERON) tablet 7.5 mg  7.5 mg Oral QHS Marjan Rabbani, MD   7.5 mg at 08/19/15 2308  . multivitamin with minerals tablet 1 tablet  1 tablet Oral Daily Juluis Mire, MD   1 tablet at 08/20/15 0938  . ondansetron (ZOFRAN) tablet 4 mg  4 mg Oral Q6H PRN Marjan Rabbani, MD       Or  . ondansetron (ZOFRAN) injection 4 mg  4 mg Intravenous Q6H PRN Marjan Rabbani, MD      . pantoprazole (PROTONIX) EC tablet 40 mg  40 mg Oral Daily Marjan Rabbani, MD   40 mg at 08/20/15 0937  . thiamine (VITAMIN B-1) tablet 100 mg  100 mg Oral Daily Marjan Rabbani, MD   100 mg at 08/20/15 0938  . vancomycin (VANCOCIN) 50 mg/mL oral solution 125 mg  125 mg Oral QID Zada Finders, MD       Followed by  . [START ON 09/03/2015] vancomycin (VANCOCIN) 50 mg/mL oral solution 125 mg  125 mg Oral BID Zada Finders, MD       Followed by  . [START ON 09/11/2015] vancomycin (VANCOCIN) 50 mg/mL oral solution 125 mg  125 mg Oral Daily Zada Finders, MD       Followed by  . [START ON 09/18/2015] vancomycin (VANCOCIN) 50 mg/mL oral solution 125 mg  125 mg Oral QODAY Zada Finders, MD       Followed by  . [START ON 09/26/2015] vancomycin (VANCOCIN) 50 mg/mL oral solution 125 mg  125 mg Oral Q3 days Zada Finders, MD       Labs: Basic Metabolic Panel:  Recent Labs Lab 08/19/15 1454 08/20/15 0637  NA 131* 128*  K 4.4 4.8  CL 96* 97*  CO2 17* 19*  GLUCOSE 137* 100*  BUN 6 8  CREATININE 4.08* 4.40*  CALCIUM 7.7* 7.5*  PHOS  --  2.5   Liver Function Tests:  Recent Labs Lab 08/19/15 1454 08/19/15 2129 08/20/15 0637  AST 78*  --  74*  ALT 13*  --  18  ALKPHOS 183*  --  167*  BILITOT 6.0* 5.9* 6.0*  PROT 7.4  --  7.1  ALBUMIN 1.3*  --  1.2*  1.2*   CBC:  Recent Labs Lab 08/19/15 1454 08/20/15 0637  WBC 7.2 8.8  NEUTROABS 4.8 5.4  HGB 10.9* 8.6*  HCT 32.2* 26.3*  MCV 105.2* 103.1*  PLT 44* 51*   ROS: As per HPI otherwise negative.  Physical Exam: Filed  Vitals:   08/20/15 0813 08/20/15 0917 08/20/15 0942 08/20/15 1110  BP: 101/48   91/42  Pulse: 127 126 121 109  Temp:   100.5 F (38.1 C)   TempSrc:   Rectal   Resp: 20   20  Height:      Weight:      SpO2: 97% 99%  100%     General:  Ill emaciated AAF sitting in bed Neck: Supple. JVD not elevated. Lungs: Clear bilaterally  Heart: RRR 2/6 murmur Abdomen: Soft, tender right mid/ower quandrant M-S: marked muscle wasting Lower extremities:without edema or ischemic changes, no open wounds  Neuro: Alert and oriented X 3. Moves all extremities spontaneously. Psych:  Responds to questions appropriately with a normal affect. Dialysis Access:left upper AVGG + bruit  Dialysis Orders: [facility] M[facility] - last hd 3/13 4 K 2.25 Ca EDW 34.5 left upper AVGG heparin 2000 no profile Qb 350 DFR 1.5 Mircera 225 last  got 150 2/15 - probably had ESA during recent hospitalizations calcitriol 1.25  Recent labs:  Hgb 8.9 3/8 10.3 3/22 Fe 86 no tsat - last ferritin 1694 03/2015 Ca 7.4 P 1/4 iPTH 298 Assessment/Plan: 1.  Recurrent Cdiff treatment per primary; fever likely due to C diff but could have other source - cultures drawn. 2. Severe protein calorie malnutrition as manifested by low K, Alb, P and BUN, nutrition consult 3.  ESRD -  MWF - HD today -  4.  BP/volume  - no overt volume on exam but Na is low and above outpt EDW 5.  Anemia  - Hgb 8.6 - continue ESA; last Fe level was 86 6.  Metabolic bone disease -  Continue calcitriol, no binders 7. SLE- usual meds 8. Thrombocytopenia - actually better than last admission when she had a nadir of 29 - now 51 9. Depression- meds per primary 10. Alcohol abuse 11. Liver disease - Increased Bili- w/u per primary 12. Seizure disorder - keppra 13. Hx pancreatitis on Creon 14. Disp - I think she needs a goals of care meeting to include husband and daughter; daughter, who also lives with patient seemed disproportionately angry   Myriam Jacobson, Hershal Coria Clint [phone] 08/20/2015, 12:37 PM   Renal Attending; I agree with the note as articulated above.  We will proceed with her dialysis today and get back on her schedule. Anisha Starliper C

## 2015-08-20 NOTE — Addendum Note (Signed)
Addended by: Forde Dandy on: 08/20/2015 04:00 PM   Modules accepted: Orders, Medications

## 2015-08-20 NOTE — Progress Notes (Signed)
Initial Nutrition Assessment  DOCUMENTATION CODES:   Severe malnutrition in context of chronic illness, Underweight  INTERVENTION:   -Ensure Enlive po TID, each supplement provides 350 kcal and 20 grams of protein  NUTRITION DIAGNOSIS:   Malnutrition related to chronic illness as evidenced by severe depletion of muscle mass, severe depletion of body fat.  GOAL:   Patient will meet greater than or equal to 90% of their needs  MONITOR:   PO intake, Supplement acceptance, Labs, Weight trends, Skin, I & O's  REASON FOR ASSESSMENT:   Consult, Malnutrition Screening Tool Assessment of nutrition requirement/status  ASSESSMENT:   Pt is a 60 yo F with a PMHx of ESRD on HD 2/2 SLE and FSGS, chronic diastolic heart failure, chronic alcohol abuse, and chronic pancreatitis presenting to [facility] complaining of continued diarrhea since her last hospital admission. She was recently admitted on 08/05/15 for C. Diff diarrhea and Influenza A. Patient states she has been taking vancomycin regularly since her discharge on 08/07/15 and finished the course of antibiotic 2-3 days ago. States she did not have any improvement in her diarrhea despite being compliant with the medication and still continues to have severe diarrhea which she describes as brown in color and mixed with white mucus-like material. Denies having any bloody diarrhea. Reports having mild occasional lower abdominal pain. States she does not have any nausea/ vomiting and continues to have adequate oral intake. However, does report feeling tired. States she was due for dialysis yesterday but could not go since she woke up covered in feces.   Pt admitted with recurrent C-diff infection.   Hx obtained mainly from pt at bedside, however, pt's two daughters were also in the room. Pt reports very poor appetite at baseline, as she doesn't feel like eating. She typically consumes only one meal per day, consisting of a sandwich. Pt reports that her  appetite is slowly improving and ate some eggs and pancake for breakfast this morning. She also shares that she consumes one chocolate Ensure daily. Pt also admits to frequent ETOH abuse.   Pt reports ongoing wt loss, however, unable to provide further hx. She reports UBW around 100#, however, unsure when she last weighed that amount. Per wt hx, pt's wt has been around 80# over the past year.   Pt's family members went to get a [employer] sandwich for pt to consume prior to HD. Per pt daughter, "she always eats a sandwich before HD; it's her routine".   Pt reports she likes Ensure supplements and would like them provided 3 times daily.   Nutrition-Focused physical exam completed. Findings are severe fat depletion, severe muscle depletion, and no edema.   Case discussed with nephrology PA prior to visit. She reports continued concern with pt's malnutrition and very appreciative of RD assistance.   Medications reviewed. Pt receiving Creon before meals and remeron.   Labs reviewed: Na: 128, Mg: 1.3.   Diet Order:  Diet regular Room service appropriate?: Yes; Fluid consistency:: Thin; Fluid restriction:: 1200 mL Fluid  Skin:  Reviewed, no issues  Last BM:  08/20/15  Height:   Ht Readings from Last 1 Encounters:  08/19/15 5\' 1"  (1.549 m)    Weight:   Wt Readings from Last 1 Encounters:  08/20/15 83 lb 6.4 oz (37.83 kg)    Ideal Body Weight:  47.7 kg  BMI:  Body mass index is 15.77 kg/(m^2).  Estimated Nutritional Needs:   Kcal:  1300-1500  Protein:  60-75 grams  Fluid:  >1.3  L  EDUCATION NEEDS:   Education needs addressed  Lular Letson A. Jimmye Norman, RD, LDN, CDE Pager: [phone] After hours Pager: [phone]

## 2015-08-21 LAB — CBC
HEMATOCRIT: 21.8 % — AB (ref 36.0–46.0)
Hemoglobin: 7.3 g/dL — ABNORMAL LOW (ref 12.0–15.0)
MCH: 35.4 pg — AB (ref 26.0–34.0)
MCHC: 33.5 g/dL (ref 30.0–36.0)
MCV: 105.8 fL — AB (ref 78.0–100.0)
Platelets: 19 10*3/uL — CL (ref 150–400)
RBC: 2.06 MIL/uL — ABNORMAL LOW (ref 3.87–5.11)
RDW: 22.4 % — AB (ref 11.5–15.5)
WBC: 6.2 10*3/uL (ref 4.0–10.5)

## 2015-08-21 LAB — RENAL FUNCTION PANEL
Albumin: <1 g/dL — ABNORMAL LOW (ref 3.5–5.0)
Anion gap: 8 (ref 5–15)
CHLORIDE: 99 mmol/L — AB (ref 101–111)
CO2: 27 mmol/L (ref 22–32)
Calcium: 7.3 mg/dL — ABNORMAL LOW (ref 8.9–10.3)
Creatinine, Ser: 2.79 mg/dL — ABNORMAL HIGH (ref 0.44–1.00)
GFR calc Af Amer: 20 mL/min — ABNORMAL LOW (ref >60)
GFR, EST NON AFRICAN AMERICAN: 18 mL/min — AB (ref >60)
GLUCOSE: 93 mg/dL (ref 65–99)
POTASSIUM: 3.7 mmol/L (ref 3.5–5.1)
Phosphorus: 2.2 mg/dL — ABNORMAL LOW (ref 2.5–4.6)
Sodium: 134 mmol/L — ABNORMAL LOW (ref 135–145)

## 2015-08-21 NOTE — Care Management Obs Status (Signed)
[place] NOTIFICATION   Patient Details  Name: [name] [name] MRN: [id number] Date of Birth: 1955/11/30   Medicare Observation Status Notification Given:  Yes Patient in isolation room therefore original not signed but patient verbalized understanding about Medicare observation letter and copy left at bedside.   Guido Sander, RN 08/21/2015, 11:51 AM

## 2015-08-21 NOTE — Progress Notes (Signed)
PT Cancellation Note  Patient Details Name: [name] [name] MRN: [id number] DOB: 11/07/1955   Cancelled Treatment:    Reason Eval/Treat Not Completed: Patient declined, no reason specified (Pt refusing therapy due to stomach cramping and pain).  PT will continue to follow acutely.  Collie Siad PT, DPT  Pager: [phone] Phone: [phone] 08/21/2015, 1:05 PM

## 2015-08-21 NOTE — Progress Notes (Addendum)
   Subjective: Patient says she is doing well this morning. She says she had 2 episodes of soft stool yesterday and one overnight. She says her stool is forming and feels she has some improvement with her diarrhea. She complains of mild abdominal cramping. She had dialysis yesterday which she tolerated. She says she has been able to eat pretty good today and is drinking fluids. She does report an episode of nosebleed this morning which was self-limited and improved now.  Objective: Vital signs in last 24 hours: Filed Vitals:   08/20/15 1828 08/20/15 2220 08/20/15 2221 08/21/15 0512  BP:  '79/48 84/53 80/53 '$  Pulse: 72 139 135 66  Temp:  99.1 F (37.3 C)  98.8 F (37.1 C)  TempSrc:  Oral    Resp:  18  18  Height:      Weight:   73 lb 3.1 oz (33.2 kg)   SpO2: 95% 100% 100% 100%   Weight change: -9 lb 14.7 oz (-4.5 kg)  Intake/Output Summary (Last 24 hours) at 08/21/15 1214 Last data filed at 08/20/15 1716  Gross per 24 hour  Intake      0 ml  Output      0 ml  Net      0 ml   General: cachectic woman, resting in bed, no acute distress Cardiac: RRR Pulm: clear to auscultation bilaterally, moving normal volumes of air Abd: soft, nontender, nondistended, BS present Ext: thin extremities, no pedal edema   Assessment/Plan: Active Problems:   Hyperparathyroidism, secondary renal (HCC)   Depression   GERD   Chronic pancreatitis (HCC)   Seizure disorder (HCC)   Severe protein-calorie malnutrition (HCC)   Anemia of chronic disease   Folate deficiency   Lupus (systemic lupus erythematosus) (HCC)   ESRD (end stage renal disease) on dialysis (HCC)   Recurrent Clostridium difficile diarrhea   B12 deficiency   Cirrhosis (HCC)  Persistent C. Difficile diarrhea: Patient says her stools are starting to form. Her fever and tachycardia are improved.Have discussed with ID with suggestion for Vancomycin taper as treatment option for her persistent C diff with follow up in ID clinic  outpatient. Our pharmacists have worked to help provide Ms. Orzel with a supply of oral vancomycin for home. -Appreciate ID and Pharmacy assistance -Vancomycin taper -Will need outpatient ID appointment -Continue oral hydration -Will likely benefit from SNF on discharge, PT eval pending -Continue lactobacillus 1 g TID -f/u blood cultures  ESRD on MWF HD: Missed dialysis on Wednesday. IV access was unable to be obtained for IV fluid resuscitation. Nephrology following, completed HD yesterday.  -HD per nephrology  SLE -Continue Plaquenil 400 mg daily  -Hold Azathiopine 50 mg daily in setting of thrombocytopenia  Liver disease: CT abdomen on 07/30/15 with changes suspicious for cirrhosis. Her aPTT is elevated at 41. Total bilirubin is 6.0, direct 3.5, indirect 2.5, Alk phos 167, AST 74, ALt 18. She has chronic thrombocytopenia, with platelets down to 19K from 51K on admission. She had a self-limited nosebleed this morning, which does not warrant transfusion at this time. -Hold Azathioprine  -Monitor for bleeding  Chronic pancreatitis -Continue Creon 12,000 units TID  Depression -Continue Prozac 10 mg daily -Continue Mirtazapine 7.5 mg daily    Dispo: Disposition is deferred at this time, awaiting improvement of current medical problems.  Anticipated discharge in approximately 1-3 day(s).     LOS: 2 days   Zada Finders, MD 08/21/2015, 12:14 PM

## 2015-08-21 NOTE — Progress Notes (Signed)
Assessment/Plan: 1. Recurrent Cdiff treatment per primary; fever likely due to C diff but could have other source - cultures drawn. 2. Severe protein calorie malnutrition as manifested by low K, Alb, P and BUN, nutrition consult 3. ESRD - MWF  4. SLE- usual meds 5. Thrombocytopenia - severe.  Need to hold azathiaprine 6. Depression- meds per primary 7. Alcohol abuse 8. Liver disease - Increased Bili- w/u per primary 9. Seizure disorder - keppra 10. Hx pancreatitis on Creon  Subjective: Interval History:  Weak but better  Objective: Vital signs in last 24 hours: Temp:  [97 F (36.1 C)-99.1 F (37.3 C)] 98.8 F (37.1 C) (03/18 0512) Pulse Rate:  [47-139] 66 (03/18 0512) Resp:  [15-20] 18 (03/18 0512) BP: (79-141)/(48-87) 80/53 mmHg (03/18 0512) SpO2:  [95 %-100 %] 100 % (03/18 0512) Weight:  [33.2 kg (73 lb 3.1 oz)-34.2 kg (75 lb 6.4 oz)] 33.2 kg (73 lb 3.1 oz) (03/17 2221) Weight change: -4.5 kg (-9 lb 14.7 oz)  Intake/Output from previous day:   Intake/Output this shift:   Alert and appropriate  GI: soft, mild diffuse tenderness esp RLE Extremities: no edema  Lab Results:  Recent Labs  08/20/15 0637 08/21/15 0729  WBC 8.8 6.2  HGB 8.6* 7.3*  HCT 26.3* 21.8*  PLT 51* 19*   BMET:  Recent Labs  08/20/15 0637 08/21/15 0729  NA 128* 134*  K 4.8 3.7  CL 97* 99*  CO2 19* 27  GLUCOSE 100* 93  BUN 8 <5*  CREATININE 4.40* 2.79*  CALCIUM 7.5* 7.3*   No results for input(s): PTH in the last 72 hours. Iron Studies: No results for input(s): IRON, TIBC, TRANSFERRIN, FERRITIN in the last 72 hours. Studies/Results: No results found.  Scheduled: . azaTHIOprine  50 mg Oral Daily  . calcitRIOL  1.25 mcg Oral Q M,W,F-HD  . calcium carbonate  400 mg of elemental calcium Oral QHS  . darbepoetin (ARANESP) injection - DIALYSIS  200 mcg Intravenous Q Fri-HD  . feeding supplement (ENSURE ENLIVE)  237 mL Oral TID WC  . FLUoxetine  10 mg Oral Daily  . folic acid  1 mg  Oral Daily  . gabapentin  300 mg Oral Daily  . hydroxychloroquine  400 mg Oral Daily  . lactobacillus  1 g Oral TID  . levETIRAcetam  250 mg Oral Daily  . lipase/protease/amylase  12,000 Units Oral TID AC  . mirtazapine  7.5 mg Oral QHS  . multivitamin with minerals  1 tablet Oral Daily  . pantoprazole  40 mg Oral Daily  . thiamine  100 mg Oral Daily  . vancomycin  125 mg Oral QID   Followed by  . [START ON 09/03/2015] vancomycin  125 mg Oral BID   Followed by  . [START ON 09/11/2015] vancomycin  125 mg Oral Daily   Followed by  . [START ON 09/18/2015] vancomycin  125 mg Oral QODAY   Followed by  . [START ON 09/26/2015] vancomycin  125 mg Oral Q3 days    LOS: 2 days   Rucker Pridgeon C 08/21/2015,11:36 AM

## 2015-08-21 NOTE — Progress Notes (Signed)
CRITICAL VALUE ALERT  Critical value received:  Platelets 19  Date of notification:  08/21/15  Time of notification:  [id number]  Critical value read back:Yes.    Nurse who received alert:  Alphonzo Lemmings, RN  MD notified (1st page):  Text paged Dr. Posey Pronto  Time of first page:  1205  MD notified (2nd page):  Time of second page:  Responding MD:  Dr. Posey Pronto  Time MD responded:  1208

## 2015-08-22 DIAGNOSIS — D619 Aplastic anemia, unspecified: Secondary | ICD-10-CM | POA: Diagnosis present

## 2015-08-22 DIAGNOSIS — D61818 Other pancytopenia: Secondary | ICD-10-CM

## 2015-08-22 LAB — CBC
HCT: 22.9 % — ABNORMAL LOW (ref 36.0–46.0)
Hemoglobin: 7.3 g/dL — ABNORMAL LOW (ref 12.0–15.0)
MCH: 34 pg (ref 26.0–34.0)
MCHC: 31.9 g/dL (ref 30.0–36.0)
MCV: 106.5 fL — ABNORMAL HIGH (ref 78.0–100.0)
Platelets: 19 10*3/uL — CL (ref 150–400)
RBC: 2.15 MIL/uL — ABNORMAL LOW (ref 3.87–5.11)
RDW: 23.1 % — ABNORMAL HIGH (ref 11.5–15.5)
WBC: 4.7 10*3/uL (ref 4.0–10.5)

## 2015-08-22 MED ORDER — FIDAXOMICIN 200 MG PO TABS
200.0000 mg | ORAL_TABLET | Freq: Two times a day (BID) | ORAL | Status: AC
  Administered 2015-08-22 – 2015-08-31 (×18): 200 mg via ORAL
  Filled 2015-08-22 (×28): qty 1

## 2015-08-22 NOTE — Progress Notes (Signed)
[name], female   DOB: 1956-02-08, 59 y.o.   MRN: [id number] Medicine attending: I examined this patient this morning together with resident physician Dr. Zada Finders and I concur with his evaluation and management plan which we discussed together. She has had a setback after initial trend for improvement. Persistent diarrhea with semi-formed stools. GI pathogen panel negative for other potential infections than the known C. difficile. Intermittent sharp lower abdominal pain. Abdomen currently mildly distended. Soft. Nontender. White blood count trending down towards her baseline. Hemoglobin decreased but stable at 7.3 g. Fall in her platelet count from her usual baseline. Although pancytopenia may be due to her advanced liver disease, acute change from baseline suggest that we need to consider that prolonged vancomycin treatment may be contributing. We may need to change her to fidaxomycin.

## 2015-08-22 NOTE — Progress Notes (Signed)
Assessment/Plan: 1. Recurrent Cdiff . 2. Severe protein calorie malnutrition  3. ESRD - MWF 4. SLE- usual meds 5. Thrombocytopenia - severe. Azathiapine and vancomycin held.  Now on Dificid 6. Depression-  per primary 7. Alcohol abuse 8. Liver disease - Increased Bili- w/u per primary 9. Seizure disorder - keppra 10. Hx pancreatitis on Creon  Subjective: Interval History:still with gi losses and poor intake  Objective: Vital signs in last 24 hours: Temp:  [97.6 F (36.4 C)-103.1 F (39.5 C)] 100.8 F (38.2 C) (03/19 1526) Pulse Rate:  [95-149] 121 (03/19 1526) Resp:  [16-20] 20 (03/19 1526) BP: (85-95)/(51-61) 86/51 mmHg (03/19 1526) SpO2:  [96 %-100 %] 100 % (03/19 1526) Weight:  [34.609 kg (76 lb 4.8 oz)] 34.609 kg (76 lb 4.8 oz) (03/19 0553) Weight change: 0.509 kg (1 lb 2 oz)  Intake/Output from previous day:   Intake/Output this shift:    General appearance: alert, cooperative and muscle wasting is present Extremities: muscle wasting  Lab Results:  Recent Labs  08/21/15 0729 08/22/15 0619  WBC 6.2 4.7  HGB 7.3* 7.3*  HCT 21.8* 22.9*  PLT 19* 19*   BMET:  Recent Labs  08/20/15 0637 08/21/15 0729  NA 128* 134*  K 4.8 3.7  CL 97* 99*  CO2 19* 27  GLUCOSE 100* 93  BUN 8 <5*  CREATININE 4.40* 2.79*  CALCIUM 7.5* 7.3*   No results for input(s): PTH in the last 72 hours. Iron Studies: No results for input(s): IRON, TIBC, TRANSFERRIN, FERRITIN in the last 72 hours. Studies/Results: No results found.  Scheduled: . calcitRIOL  1.25 mcg Oral Q M,W,F-HD  . calcium carbonate  400 mg of elemental calcium Oral QHS  . darbepoetin (ARANESP) injection - DIALYSIS  200 mcg Intravenous Q Fri-HD  . feeding supplement (ENSURE ENLIVE)  237 mL Oral TID WC  . fidaxomicin  200 mg Oral BID  . FLUoxetine  10 mg Oral Daily  . folic acid  1 mg Oral Daily  . gabapentin  300 mg Oral Daily  . hydroxychloroquine  400 mg Oral Daily  . lactobacillus  1 g Oral TID  .  levETIRAcetam  250 mg Oral Daily  . lipase/protease/amylase  12,000 Units Oral TID AC  . mirtazapine  7.5 mg Oral QHS  . multivitamin with minerals  1 tablet Oral Daily  . pantoprazole  40 mg Oral Daily  . thiamine  100 mg Oral Daily    LOS: 3 days   Lyndia Bury C 08/22/2015,4:56 PM

## 2015-08-22 NOTE — Progress Notes (Signed)
Occupational Therapy Evaluation Patient Details Name: [name] [name] MRN: [id number] DOB: 05/25/1956 Today's Date: 08/22/2015    History of Present Illness 60 y.o. female admitted for weakness, nausea, and chronic diarrhea. 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    Clinical Impression   PTA, pt was independent with ADLs and used [facility] for mobility. Pt currently requires mod assist for some ADLs, min assist for ambulation, and min guard assist for transfers. Pt plans to d/c home with intermittent assistance from her daughter. Pt will benefit from continued acute OT to maximize independence and safety with ADLs and mobility to allow for safe discharge home. Will continue to follow acutely.    Follow Up Recommendations  No OT follow up;Supervision/Assistance - 24 hour    Equipment Recommendations  Other (comment) (RW-2 wheeled)    Recommendations for Other Services       Precautions / Restrictions Precautions Precautions: Fall Restrictions Weight Bearing Restrictions: No      Mobility Bed Mobility Overal bed mobility: Needs Assistance Bed Mobility: Supine to Sit;Sit to Supine     Supine to sit: Min assist;HOB elevated Sit to supine: Supervision;HOB elevated   General bed mobility comments: HOB elevated, use of bedrails. Min assist for truncal support to come to sitting position.   Transfers Overall transfer level: Needs assistance Equipment used: 1 person hand held assist Transfers: Sit to/from Stand Sit to Stand: Min assist         General transfer comment: Hand held assist for balance. Pt uses SPC at baseline.     Balance Overall balance assessment: Needs assistance;No apparent balance deficits (not formally assessed) Sitting-balance support: No  upper extremity supported;Feet supported Sitting balance-Leahy Scale: Good Sitting balance - Comments: Due to large hemorrhoid    Standing balance support: Single extremity supported;During functional activity Standing balance-Leahy Scale: Fair Standing balance comment: Able to maintain static standing balance without UE support. Requires UE support for dynamic balance tasks                            ADL Overall ADL's : Needs assistance/impaired Eating/Feeding: Set up;Bed level   Grooming: Wash/dry hands;Supervision/safety;Standing           Upper Body Dressing : Supervision/safety;Sitting   Lower Body Dressing: Supervision/safety;Sit to/from stand   Toilet Transfer: Min guard;Ambulation;Regular Toilet;Grab bars   Toileting- Clothing Manipulation and Hygiene: Moderate assistance;Sit to/from stand Toileting - Clothing Manipulation Details (indicate cue type and reason): for pericare due to pt having large hemorrhoid and asking for assistance. Pt able to don/doff underwear with supervision     Functional mobility during ADLs: Minimal assistance       Vision Vision Assessment?: No apparent visual deficits Additional Comments: Pt declined to participate in vision testing. Pt wanted to eat lunch   Perception Perception Perception Tested?: No   Praxis      Pertinent Vitals/Pain Pain Assessment: Faces Faces Pain Scale: Hurts even more Pain Location: stomach Pain Descriptors / Indicators: Cramping Pain Intervention(s): Limited activity within patient's tolerance;Monitored during session;Repositioned     Hand Dominance Right   Extremity/Trunk Assessment Upper Extremity Assessment Upper Extremity Assessment: Generalized weakness   Lower Extremity Assessment Lower Extremity Assessment: Generalized weakness   Cervical / Trunk Assessment Cervical / Trunk Assessment: Kyphotic   Communication Communication Communication: No  difficulties   Cognition  Arousal/Alertness: Awake/alert Behavior During Therapy: Flat affect Overall Cognitive Status: Within Functional Limits for tasks assessed                     General Comments       Exercises       Shoulder Instructions      Home Living Family/patient expects to be discharged to:: Private residence Living Arrangements: Children (daughter) Available Help at Discharge: Family;Available PRN/intermittently (Daughter works during the day) Type of Home: House Home Access: Stairs to enter Technical brewer of Steps: 3 Entrance Stairs-Rails: Right;Left;Can reach both Home Layout: One level     Bathroom Shower/Tub: Tub/shower unit;Curtain Shower/tub characteristics: Architectural technologist: Standard     Home Equipment: Cane - single point;Bedside commode;Shower seat;Grab bars - tub/shower   Additional Comments: home O2--4L      Prior Functioning/Environment Level of Independence: Independent with assistive device(s)        Comments: Uses SPC. Doesn't drive. Daughter drives to grocery store and other errands. Pt takes SCAT service to/from dialysis    OT Diagnosis: Generalized weakness;Acute pain   OT Problem List: Decreased strength;Decreased range of motion;Decreased activity tolerance;Impaired balance (sitting and/or standing);Decreased safety awareness;Decreased knowledge of use of DME or AE;Pain   OT Treatment/Interventions: Self-care/ADL training;Therapeutic exercise;Energy conservation;DME and/or AE instruction;Therapeutic activities;Patient/family education;Balance training    OT Goals(Current goals can be found in the care plan section) Acute Rehab OT Goals Patient Stated Goal: to get better OT Goal Formulation: With patient Time For Goal Achievement: 09/05/15 Potential to Achieve Goals: Fair ADL Goals Pt Will Perform Grooming: with modified independence;standing Pt Will Perform Upper Body Dressing: with modified independence;standing Pt Will Perform  Lower Body Dressing: with modified independence;sit to/from stand Pt Will Transfer to Toilet: with modified independence;ambulating;regular height toilet Pt Will Perform Toileting - Clothing Manipulation and hygiene: with modified independence;sit to/from stand Pt Will Perform Tub/Shower Transfer: Tub transfer;with modified independence;ambulating;shower seat;grab bars  OT Frequency: Min 2X/week   Barriers to D/C: Decreased caregiver support  Pt's daughter works during the day and pt is home alone       Co-evaluation              End of Session Nurse Communication: Mobility status  Activity Tolerance: Patient limited by fatigue Patient left: in bed;with call bell/phone within reach;with bed alarm set   Time: [id number] OT Time Calculation (min): 19 min Charges:  OT General Charges $OT Visit: 1 Procedure OT Evaluation $OT Eval Moderate Complexity: 1 Procedure G-Codes: OT G-codes **NOT FOR INPATIENT CLASS** Functional Assessment Tool Used: clinical judgement Functional Limitation: Self care Self Care Current Status [id number]): At least 1 percent but less than 20 percent impaired, limited or restricted Self Care Goal Status [id number]): At least 1 percent but less than 20 percent impaired, limited or restricted  Redmond Baseman, OTR/L Pager: [phone] 08/22/2015, 3:07 PM

## 2015-08-22 NOTE — Progress Notes (Signed)
   Subjective: Patient says she has had worsened frequency of diarrhea yesterday and today. She says her stool is semi-formed. She complains of occasional sharp abdominal pain and cramping. She was unable to work with PT yesterday due to pain. She says she is able to eat and drink without issue.  Objective: Vital signs in last 24 hours: Filed Vitals:   08/21/15 2128 08/22/15 0009 08/22/15 0549 08/22/15 0553  BP:   85/61   Pulse:  117 95   Temp: 103.1 F (39.5 C) 98.7 F (37.1 C) 97.6 F (36.4 C)   TempSrc: Rectal Oral Oral   Resp:   16   Height:      Weight:    76 lb 4.8 oz (34.609 kg)  SpO2:   100%    Weight change: 1 lb 2 oz (0.509 kg) No intake or output data in the 24 hours ending 08/22/15 1431 General: cachectic woman, resting in bed, no acute distress Cardiac: tachycardic, regular rhythm Pulm: clear to auscultation bilaterally, moving normal volumes of air Abd: soft, nontender, nondistended, BS present Ext: thin extremities, no pedal edema   Assessment/Plan: Active Problems:   Hyperparathyroidism, secondary renal (HCC)   Depression   GERD   Chronic pancreatitis (HCC)   Seizure disorder (HCC)   Severe protein-calorie malnutrition (HCC)   Anemia of chronic disease   Folate deficiency   Lupus (systemic lupus erythematosus) (HCC)   ESRD (end stage renal disease) on dialysis (HCC)   Recurrent Clostridium difficile diarrhea   B12 deficiency   Cirrhosis ([place])   Anemia due to bone marrow failure (HCC)  Persistent C. Difficile diarrhea: Patient with persistent diarrhea and fever spike to 103.20F. She also has continued thrombocytopenia of 19K which is lower than her baseline. This may be related to the Vancomycin, although oral formulation is not typically absorbed systemically. Have discussed with ID who recommend switching to Dificid (Fidaxomicin) and monitor for improvement. We will work to try to assist patient in obtaining this if she is to continue Dificid on  discharge.  -Appreciate ID and Pharmacy assistance -d/c Vancomycin taper -Start Dificid 200 mg po BID for 10 days (3/19>>3/28) -Will need outpatient ID appointment -Continue oral hydration -Will likely benefit from SNF on discharge, PT eval pending -Continue lactobacillus 1 g TID -f/u blood cultures >> NGTD  ESRD on MWF HD: IV access was unable to be obtained for IV fluid resuscitation. Nephrology following.  -HD per nephrology  SLE -Continue Plaquenil 400 mg daily  -Hold Azathiopine 50 mg daily in setting of thrombocytopenia  Liver disease: She has chronic thrombocytopenia, with platelets down at 19K from 51K on admission. She had a self-limited nosebleed yesterday morning, otherwise no active bleeding. Her lower than baseline thrombocytopenia may be related to the Vancomycin, which we will discontinue. -Hold Azathioprine  -Monitor for bleeding  Chronic pancreatitis -Continue Creon 12,000 units TID  Depression -Continue Prozac 10 mg daily -Continue Mirtazapine 7.5 mg daily    Dispo: Disposition is deferred at this time, awaiting improvement of current medical problems.  Anticipated discharge in approximately 1-3 day(s).     LOS: 3 days   Zada Finders, MD 08/22/2015, 2:31 PM

## 2015-08-22 NOTE — Evaluation (Signed)
Physical Therapy Evaluation Patient Details Name: [name] [name] MRN: [id number] DOB: May 16, 1956 Today's Date: 08/22/2015   History of Present Illness  60 y.o. female admitted for weakness, nausea, and chronic diarrhea. 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   Clinical Impression  Pt admitted with above diagnosis. Pt currently with functional limitations due to the deficits listed below (see PT Problem List).  Pt will benefit from skilled PT to increase their independence and safety with mobility to allow discharge to the venue listed below.  Recommend HHPT at d/c, but pt may refuse.     Follow Up Recommendations Supervision for mobility/OOB;Home health PT    Equipment Recommendations  None recommended by PT    Recommendations for Other Services       Precautions / Restrictions Precautions Precautions: Fall Restrictions Weight Bearing Restrictions: No      Mobility  Bed Mobility Overal bed mobility: Needs Assistance Bed Mobility: Supine to Sit;Sit to Supine     Supine to sit: Min assist;HOB elevated Sit to supine: Supervision;HOB elevated   General bed mobility comments: +rails, assist to elevate trunk  Transfers Overall transfer level: Needs assistance Equipment used: 1 person hand held assist Transfers: Sit to/from Stand Sit to Stand: Min guard         General transfer comment: Hand held assist for balance. Pt uses SPC at baseline.   Ambulation/Gait Ambulation/Gait assistance: Min guard Ambulation Distance (Feet): 50 Feet Assistive device: 1 person hand held assist Gait Pattern/deviations: Step-through pattern;Decreased stride length;Narrow base of support Gait velocity: decreased Gait velocity interpretation: Below normal speed for  age/gender General Gait Details: slow, steady gait  Stairs            Wheelchair Mobility    Modified Rankin (Stroke Patients Only)       Balance Overall balance assessment: Needs assistance;No apparent balance deficits (not formally assessed) Sitting-balance support: No upper extremity supported;Feet supported Sitting balance-Leahy Scale: Good Sitting balance - Comments: Due to large hemorrhoid    Standing balance support: Single extremity supported;During functional activity Standing balance-Leahy Scale: Fair Standing balance comment: Able to maintain static standing balance without UE support. Requires UE support for dynamic balance tasks                             Pertinent Vitals/Pain Pain Assessment: Faces Faces Pain Scale: Hurts little more Pain Location: abdomen Pain Descriptors / Indicators: Cramping Pain Intervention(s): Monitored during session;Repositioned    Home Living Family/patient expects to be discharged to:: Private residence Living Arrangements: Children Available Help at Discharge: Family;Available PRN/intermittently Type of Home: House Home Access: Stairs to enter Entrance Stairs-Rails: Right;Left;Can reach both Entrance Stairs-Number of Steps: 3 Home Layout: One level Home Equipment: Cane - single point;Bedside commode;Shower seat;Grab bars - tub/shower Additional Comments: home O2--4L    Prior Function Level of Independence: Independent with assistive device(s)         Comments: Uses SPC. Doesn't drive. Daughter drives to grocery store and other errands. Pt takes SCAT service to/from dialysis     Hand Dominance   Dominant Hand: Right    Extremity/Trunk Assessment   Upper Extremity Assessment: Generalized weakness           Lower Extremity Assessment: Generalized weakness      Cervical / Trunk Assessment:  Kyphotic  Communication   Communication: No difficulties  Cognition Arousal/Alertness:  Awake/alert Behavior During Therapy: Flat affect Overall Cognitive Status: Within Functional Limits for tasks assessed                      General Comments      Exercises        Assessment/Plan    PT Assessment Patient needs continued PT services  PT Diagnosis Difficulty walking;Generalized weakness;Acute pain   PT Problem List Decreased strength;Decreased activity tolerance;Decreased balance;Decreased mobility;Pain  PT Treatment Interventions Gait training;Stair training;Functional mobility training;Therapeutic exercise;Therapeutic activities;Balance training;Patient/family education   PT Goals (Current goals can be found in the Care Plan section) Acute Rehab PT Goals Patient Stated Goal: to get better PT Goal Formulation: With patient Time For Goal Achievement: 09/05/15 Potential to Achieve Goals: Good    Frequency Min 3X/week   Barriers to discharge        Co-evaluation               End of Session Equipment Utilized During Treatment: Gait belt Activity Tolerance: Patient tolerated treatment well Patient left: in bed;with call bell/phone within reach;with bed alarm set Nurse Communication: Mobility status    Functional Assessment Tool Used: clinical judgment Functional Limitation: Mobility: Walking and moving around Mobility: Walking and Moving Around Current Status [id number]): At least 1 percent but less than 20 percent impaired, limited or restricted Mobility: Walking and Moving Around Goal Status [phone]): At least 1 percent but less than 20 percent impaired, limited or restricted    Time: 1442-1455 PT Time Calculation (min) (ACUTE ONLY): 13 min   Charges:   PT Evaluation $PT Eval Moderate Complexity: 1 Procedure     PT G Codes:   PT G-Codes **NOT FOR INPATIENT CLASS** Functional Assessment Tool Used: clinical judgment Functional Limitation: Mobility: Walking and moving around Mobility: Walking and Moving Around Current Status [id number]): At  least 1 percent but less than 20 percent impaired, limited or restricted Mobility: Walking and Moving Around Goal Status [phone]): At least 1 percent but less than 20 percent impaired, limited or restricted    [name] [name] 08/22/2015, 4:09 PM

## 2015-08-23 ENCOUNTER — Observation Stay (HOSPITAL_COMMUNITY): Payer: Medicare Other

## 2015-08-23 ENCOUNTER — Encounter (HOSPITAL_COMMUNITY): Payer: Self-pay | Admitting: *Deleted

## 2015-08-23 DIAGNOSIS — D61818 Other pancytopenia: Secondary | ICD-10-CM

## 2015-08-23 DIAGNOSIS — R509 Fever, unspecified: Secondary | ICD-10-CM | POA: Diagnosis not present

## 2015-08-23 DIAGNOSIS — D696 Thrombocytopenia, unspecified: Secondary | ICD-10-CM

## 2015-08-23 LAB — CBC WITH DIFFERENTIAL/PLATELET
BASOS ABS: 0 10*3/uL (ref 0.0–0.1)
BASOS PCT: 1 %
EOS PCT: 1 %
Eosinophils Absolute: 0 10*3/uL (ref 0.0–0.7)
HEMATOCRIT: 24.1 % — AB (ref 36.0–46.0)
HEMOGLOBIN: 8 g/dL — AB (ref 12.0–15.0)
LYMPHS ABS: 1.1 10*3/uL (ref 0.7–4.0)
LYMPHS PCT: 25 %
MCH: 35.2 pg — AB (ref 26.0–34.0)
MCHC: 33.2 g/dL (ref 30.0–36.0)
MCV: 106.2 fL — AB (ref 78.0–100.0)
MONOS PCT: 7 %
Monocytes Absolute: 0.3 10*3/uL (ref 0.1–1.0)
NEUTROS ABS: 2.9 10*3/uL (ref 1.7–7.7)
Neutrophils Relative %: 66 %
Platelets: 15 10*3/uL — CL (ref 150–400)
RBC: 2.27 MIL/uL — ABNORMAL LOW (ref 3.87–5.11)
RDW: 22.9 % — ABNORMAL HIGH (ref 11.5–15.5)
WBC: 4.3 10*3/uL (ref 4.0–10.5)

## 2015-08-23 LAB — BASIC METABOLIC PANEL
Anion gap: 8 (ref 5–15)
BUN: 10 mg/dL (ref 6–20)
CHLORIDE: 101 mmol/L (ref 101–111)
CO2: 25 mmol/L (ref 22–32)
CREATININE: 4.83 mg/dL — AB (ref 0.44–1.00)
Calcium: 7.8 mg/dL — ABNORMAL LOW (ref 8.9–10.3)
GFR calc Af Amer: 11 mL/min — ABNORMAL LOW (ref >60)
GFR calc non Af Amer: 9 mL/min — ABNORMAL LOW (ref >60)
GLUCOSE: 110 mg/dL — AB (ref 65–99)
POTASSIUM: 3.9 mmol/L (ref 3.5–5.1)
Sodium: 134 mmol/L — ABNORMAL LOW (ref 135–145)

## 2015-08-23 LAB — FOLATE RBC
FOLATE, HEMOLYSATE: 369.8 ng/mL
Folate, RBC: 1422 ng/mL (ref >498)
HEMATOCRIT: 26 % — AB (ref 34.0–46.6)

## 2015-08-23 MED ORDER — PENTAFLUOROPROP-TETRAFLUOROETH EX AERO: 1.0000 "application " | INHALATION_SPRAY | CUTANEOUS | Status: DC | PRN

## 2015-08-23 MED ORDER — SODIUM CHLORIDE 0.9 % IV SOLN: 100.0000 mL | INTRAVENOUS | Status: DC | PRN

## 2015-08-23 MED ORDER — LIDOCAINE HCL (PF) 1 % IJ SOLN: 5.0000 mL | INTRAMUSCULAR | Status: DC | PRN

## 2015-08-23 MED ORDER — ALTEPLASE 2 MG IJ SOLR: 2.0000 mg | Freq: Once | INTRAMUSCULAR | Status: DC | PRN

## 2015-08-23 MED ORDER — HEPARIN SODIUM (PORCINE) 1000 UNIT/ML DIALYSIS: 20.0000 [IU]/kg | INTRAMUSCULAR | Status: DC | PRN

## 2015-08-23 MED ORDER — HEPARIN SODIUM (PORCINE) 1000 UNIT/ML DIALYSIS: 1000.0000 [IU] | INTRAMUSCULAR | Status: DC | PRN

## 2015-08-23 MED ORDER — LIDOCAINE-PRILOCAINE 2.5-2.5 % EX CREA
1.0000 | TOPICAL_CREAM | CUTANEOUS | Status: DC | PRN
Start: 2015-08-23 — End: 2015-08-23

## 2015-08-23 MED ORDER — SODIUM CHLORIDE 0.9 % IV SOLN
1.5000 g | Freq: Two times a day (BID) | INTRAVENOUS | Status: DC
  Filled 2015-08-23 (×2): qty 1.5

## 2015-08-23 MED ORDER — LIDOCAINE-PRILOCAINE 2.5-2.5 % EX CREA: 1.0000 "application " | TOPICAL_CREAM | CUTANEOUS | Status: DC | PRN

## 2015-08-23 MED ORDER — LEVETIRACETAM 500 MG PO TABS
500.0000 mg | ORAL_TABLET | ORAL | Status: DC
  Administered 2015-08-23 – 2015-09-01 (×5): 500 mg via ORAL
  Filled 2015-08-23 (×11): qty 1

## 2015-08-23 NOTE — Progress Notes (Addendum)
Assessment/Plan: 1. Recurrent Cdiff- off vanc, on fidaxomicin, WBC/ fevers improving, still diarrhea 2. Severe protein calorie malnutrition  3. ESRD - MWF. HD today 4. SLE- usual meds 5. Thrombocytopenia - severe. Azathioprine held. 6. Depression-  per primary 7. Alcohol abuse 8. Liver disease - Increased Bili- w/u per primary 9. Seizure disorder - keppra 10. Hx pancreatitis on Creon 11. 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 Low phos, no binder, +vit D   [place] MWF  4h  4k/2.25 bath  34.5kg  LUA AVG  Hep 2000  Qb 350   Mircera 225 last 2/15 Calcitriol 1.25 ug Hb 8.9  pth 298   Subjective: Interval History:still with diarrhea.  Eating better.   Objective: Vital signs in last 24 hours: Temp:  [97.3 F (36.3 C)-100.8 F (38.2 C)] 97.3 F (36.3 C) (03/20 0756) Pulse Rate:  [42-121] 92 (03/20 0830) Resp:  [16-20] 20 (03/20 0804) BP: (80-131)/(51-83) 131/83 mmHg (03/20 0830) SpO2:  [98 %-100 %] 99 % (03/20 0756) Weight:  [35.1 kg (77 lb 6.1 oz)] 35.1 kg (77 lb 6.1 oz) (03/20 0756) Weight change:   Intake/Output from previous day: 03/19 0701 - 03/20 0700 In: 34 [P.O.:60] Out: -  Intake/Output this shift:   Exam Awake, lethargic, slurred speech, very weak No jvd Chest clear bilat RRR no mrg Abd firm, +bs nontender MS no LE edema LUA AVG +bruit Neuro ox 3, nf  Lab Results:  Recent Labs  08/22/15 0619 08/23/15 0540  WBC 4.7 4.3  HGB 7.3* 8.0*  HCT 22.9* 24.1*  PLT 19* 15*   BMET:   Recent Labs  08/21/15 0729 08/23/15 0540  NA 134* 134*  K 3.7 3.9  CL 99* 101  CO2 27 25  GLUCOSE 93 110*  BUN <5* 10  CREATININE 2.79* 4.83*  CALCIUM 7.3* 7.8*   No results for input(s): PTH in the last 72 hours. Iron Studies: No results for input(s): IRON, TIBC, TRANSFERRIN, FERRITIN in the last 72 hours. Studies/Results: No results found.  Scheduled: . calcitRIOL  1.25 mcg Oral Q M,W,F-HD  . calcium carbonate  400 mg of elemental calcium Oral QHS  .  darbepoetin (ARANESP) injection - DIALYSIS  200 mcg Intravenous Q Fri-HD  . feeding supplement (ENSURE ENLIVE)  237 mL Oral TID WC  . fidaxomicin  200 mg Oral BID  . FLUoxetine  10 mg Oral Daily  . folic acid  1 mg Oral Daily  . gabapentin  300 mg Oral Daily  . hydroxychloroquine  400 mg Oral Daily  . lactobacillus  1 g Oral TID  . levETIRAcetam  250 mg Oral Daily  . lipase/protease/amylase  12,000 Units Oral TID AC  . mirtazapine  7.5 mg Oral QHS  . multivitamin with minerals  1 tablet Oral Daily  . pantoprazole  40 mg Oral Daily  . thiamine  100 mg Oral Daily    LOS: 4 days   Rogerick Baldwin D 08/23/2015,8:41 AM

## 2015-08-23 NOTE — Progress Notes (Signed)
[name], female   DOB: [birth year], 60 y.o.   MRN: [id number] Medicine attending: I examined this patient this morning together with resident physician Dr. Zada Finders and I concur with his evaluation and management plan which we discussed together. Temperature rose to 103 late on March 18. Maximum temperature to 100.8 on March 19. Recurrent, profuse, diarrhea despite vancomycin. She was changed to Fidaxomicin on March 20. I'm concerned with her overall immunocompromised state from lupus, chronic immunosuppressive drugs with azathioprine and Plaquenil, persistent colitis, advance kidney and liver failure, and her malnourished state, that she is now contracted a secondary infection most likely from breakdown of her intestinal barrier. No sign of infection at her vascular access graft on the left proximal arm. Small decubitus ulcer with bandages in the sacral area not the likely source of her fever. We will repeat cultures and add broad-spectrum antibiotics to cover gram-negative enteric bacteria. Lab shows further decrease in her platelet count to 15,000. No active bleeding at this time and hemoglobin decreased but stable.  This is a complex, critically ill patient who requires ongoing hospitalization.

## 2015-08-23 NOTE — Progress Notes (Signed)
   Subjective: Patient seen during dialysis. Patient says she is feeling about the same as yesterday and has continued semi-formed diarrhea which she says is "starting to thicken." She reports subjective fevers and chills. She has continued intermittent sharp abdominal pain. She says she is able to eat ok.  Objective: Vital signs in last 24 hours: Filed Vitals:   08/23/15 1000 08/23/15 1026 08/23/15 1031 08/23/15 1144  BP: 115/61 93/66 114/75 118/72  Pulse: 99 117 113 115  Temp:   96.9 F (36.1 C)   TempSrc:   Oral   Resp:   16   Height:      Weight:   77 lb 6.1 oz (35.1 kg)   SpO2:       Weight change:   Intake/Output Summary (Last 24 hours) at 08/23/15 1145 Last data filed at 08/22/15 1818  Gross per 24 hour  Intake     60 ml  Output      0 ml  Net     60 ml   General: cachectic woman, resting in bed during dialysis, no acute distress Cardiac: tachycardic, regular rhythm Pulm: clear to auscultation bilaterally, moving normal volumes of air Abd: soft, mild tenderness in lower quadrants, nondistended, BS present Ext: thin extremities, no pedal edema   Assessment/Plan: Active Problems:   Hyperparathyroidism, secondary renal (HCC)   Depression   GERD   Chronic pancreatitis (HCC)   Seizure disorder (HCC)   Severe protein-calorie malnutrition (HCC)   Anemia of chronic disease   Folate deficiency   Lupus (systemic lupus erythematosus) (HCC)   ESRD (end stage renal disease) on dialysis (HCC)   Recurrent Clostridium difficile diarrhea   B12 deficiency   Cirrhosis ([place])   Anemia due to bone marrow failure (HCC)  Persistent C. Difficile diarrhea: Patient with persistent diarrhea with Tmax of 100.8 yesterday. She also has continued thrombocytopenia down to 15K which is lower than her baseline.  This may be related to the Vancomycin. Have discussed with ID who recommend switching to Dificid (Fidaxomicin) and monitor for improvement. We will work to try to assist patient in  obtaining this if she is to continue Dificid on discharge. She was also treated for flu earlier this month and at risk for secondary infection.   -Appreciate ID and Pharmacy assistance -Dificid 200 mg po BID for 10 days (3/19>>3/28) -Will need outpatient ID appointment -Continue oral hydration -Continue lactobacillus 1 g TID -f/u repeat urine/blood cultures -Add Unasyn after cultures drawn  ESRD on MWF HD: HD today. -HD per nephrology  SLE -Continue Plaquenil 400 mg daily  -Holding Azathiopine 50 mg daily in setting of severe thrombocytopenia  Severe thrombocytopenia 2/2 Liver disease: She has chronic thrombocytopenia, with platelets down at 15K from 51K on admission. She denies any active bleeding. Her lower than baseline thrombocytopenia may be related to the Vancomycin, which we have discontinued. She has also been on immunosuppressants for her lupus which can cause thrombocytopenia. -Holding Azathioprine  -Monitor for bleeding  Chronic pancreatitis -Continue Creon 12,000 units TID  Depression -Continue Prozac 10 mg daily -Continue Mirtazapine 7.5 mg daily    Dispo: Disposition is deferred at this time, awaiting improvement of current medical problems.  Anticipated discharge in approximately 2-3 day(s).     LOS: 4 days   Zada Finders, MD 08/23/2015, 11:45 AM

## 2015-08-23 NOTE — Progress Notes (Signed)
2 IV team members attempted IV access without success. Dr Posey Pronto notified regarding IV antibiotic ordered. Dr Posey Pronto will call nurse with further orders.

## 2015-08-23 NOTE — NC FL2 (Signed)
[place] LEVEL OF CARE SCREENING TOOL     IDENTIFICATION  Patient Name: [name] [name] Birthdate: 28-Oct-1955 Sex: female Admission Date (Current Location): 08/19/2015  [facility] and [place] Number:  Herbalist and Address:  [facility], [place] [address] [place], [place] [address]      Provider Number: [id number]  Attending Physician Name and Address:  Annia Belt, MD  Relative Name and Phone Number:  Madaline Savage, sister, [phone]    Current Level of Care: Hospital Recommended Level of Care: [place] Prior Approval Number:    Date Approved/Denied:   PASRR Number: [id number]  Discharge Plan: SNF    Current Diagnoses: Patient Active Problem List   Diagnosis Date Noted  . Pancytopenia ([place]) 08/23/2015  . Thrombocytopenia ([place]) 08/23/2015  . Fever and chills   . Anemia due to bone marrow failure ([place])   . Cirrhosis ([place])   . Recurrent Clostridium difficile diarrhea 08/19/2015  . B12 deficiency 08/19/2015  . Influenza A 08/06/2015  . C. difficile diarrhea 08/05/2015  . Infection due to ESBL-producing Escherichia coli 08/05/2015  . Tricuspid regurgitation 02/11/2015  . Essential hypertension   . 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 Lactic acidosis   . ESRD (end stage renal disease) on dialysis ([place])   . Coagulopathy ([place])   . Lupus (systemic lupus erythematosus) ([place]) 06/23/2014  . Anemia of chronic disease 06/20/2014  . Folate deficiency 06/20/2014  . Prolonged Q-T interval on ECG 06/20/2014  . Systolic and diastolic CHF, chronic ([place]) 12/06/2013  . Pericardial effusion 12/03/2013  . 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 Healthcare maintenance 01/30/2013  . Seasonal allergies 01/30/2013  . Alcohol dependence ([place]) 11/06/2012  . 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 Severe protein-calorie malnutrition ([place]) 08/14/2012  . Hypoalbuminemia 08/14/2012  . Seizure disorder ([place])  09/07/2011  . 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 Chronic pain disorder 06/28/2011  . Chronic pancreatitis ([place]) 05/18/2009  . HYPERTRIGLYCERIDEMIA 11/03/2008  . Vitamin D deficiency 03/02/2008  . GERD 03/02/2008  . Hyperparathyroidism, secondary renal ([place]) 12/04/2007  . Depression 09/12/2006  . Alcohol abuse 07/02/2006    Orientation RESPIRATION BLADDER Height & Weight     Time, Self, Situation, Place  O2 (2L/min) Continent Weight: 35.1 kg (77 lb 6.1 oz) Height:  5\' 1"  (154.9 cm)  BEHAVIORAL SYMPTOMS/MOOD NEUROLOGICAL BOWEL NUTRITION STATUS      Incontinent  (Please see DC summary)  AMBULATORY STATUS COMMUNICATION OF NEEDS Skin   Limited Assist Verbally Normal                       Personal Care Assistance Level of Assistance  Bathing, Feeding, Dressing Bathing Assistance: Limited assistance Feeding assistance: Independent Dressing Assistance: Limited assistance     Functional Limitations Info             SPECIAL CARE FACTORS FREQUENCY  PT (By licensed PT)     PT Frequency: min 3x/week              Contractures      Additional Factors Info  Isolation Precautions Code Status Info: Full Allergies Info: Amitriptyline Hcl, Doxycycline Hyclate     Isolation Precautions Info: MRSA     Current Medications (08/23/2015):  This is the current hospital active medication list Current Facility-Administered Medications  Medication Dose Route Frequency Provider Last Rate Last Dose  . acetaminophen (TYLENOL) tablet 650 mg  650 mg Oral Q6H PRN Juluis Mire, MD  650 mg at 08/22/15 1545   Or  . acetaminophen (TYLENOL) suppository 650 mg  650 mg Rectal Q6H PRN Marjan Rabbani, MD      . albuterol (PROVENTIL) (2.5 MG/3ML) 0.083% nebulizer solution 3 mL  3 mL Inhalation Q6H PRN Marjan Rabbani, MD      . ampicillin-sulbactam (UNASYN) 1.5 g in sodium chloride 0.9 % 50 mL IVPB  1.5 g Intravenous Q12H Rebecka Apley, RPH      . calcitRIOL (ROCALTROL) capsule 1.25 mcg   1.25 mcg Oral Q M,W,F-HD Alric Seton, PA-C   1.25 mcg at 08/23/15 1505  . Darbepoetin Alfa (ARANESP) injection 200 mcg  200 mcg Intravenous Q Fri-HD Alric Seton, PA-C   200 mcg at 08/20/15 1515  . feeding supplement (ENSURE ENLIVE) (ENSURE ENLIVE) liquid 237 mL  237 mL Oral TID WC Annia Belt, MD   237 mL at 08/23/15 1505  . fidaxomicin (DIFICID) tablet 200 mg  200 mg Oral BID Zada Finders, MD   200 mg at 08/23/15 1138  . FLUoxetine (PROZAC) capsule 10 mg  10 mg Oral Daily Marjan Rabbani, MD   10 mg at 08/23/15 1138  . folic acid (FOLVITE) tablet 1 mg  1 mg Oral Daily Marjan Rabbani, MD   1 mg at 08/23/15 1140  . gabapentin (NEURONTIN) capsule 300 mg  300 mg Oral Daily Marjan Rabbani, MD   300 mg at 08/23/15 1140  . hydroxychloroquine (PLAQUENIL) tablet 400 mg  400 mg Oral Daily Marjan Rabbani, MD   400 mg at 08/23/15 1139  . lactobacillus (FLORANEX/LACTINEX) granules 1 g  1 g Oral TID Juluis Mire, MD   1 g at 08/23/15 1143  . levETIRAcetam (KEPPRA) tablet 250 mg  250 mg Oral Daily Marjan Rabbani, MD   250 mg at 08/23/15 1138  . levETIRAcetam (KEPPRA) tablet 500 mg  500 mg Oral Q M,W,F-1800 Roney Jaffe, MD      . lipase/protease/amylase (CREON) capsule 12,000 Units  12,000 Units Oral TID AC Juluis Mire, MD   Stopped at 08/23/15 1200  . mirtazapine (REMERON) tablet 7.5 mg  7.5 mg Oral QHS Marjan Rabbani, MD   7.5 mg at 08/22/15 2158  . multivitamin with minerals tablet 1 tablet  1 tablet Oral Daily Juluis Mire, MD   1 tablet at 08/23/15 1138  . ondansetron (ZOFRAN) tablet 4 mg  4 mg Oral Q6H PRN Marjan Rabbani, MD       Or  . ondansetron (ZOFRAN) injection 4 mg  4 mg Intravenous Q6H PRN Marjan Rabbani, MD      . pantoprazole (PROTONIX) EC tablet 40 mg  40 mg Oral Daily Marjan Rabbani, MD   40 mg at 08/23/15 1140  . thiamine (VITAMIN B-1) tablet 100 mg  100 mg Oral Daily Marjan Rabbani, MD   100 mg at 08/23/15 1138     Discharge Medications: Please see discharge summary  for a list of discharge medications.  Relevant Imaging Results:  Relevant Lab Results:   Additional Information SSN: [place]  [place], [place]

## 2015-08-23 NOTE — Progress Notes (Signed)
Pharmacy Antibiotic Note  [name] [name] is a 60 y.o. female admitted on 08/19/2015 with sepsis.  Pharmacy has been consulted for unasyn dosing. Pt has history of ESRD on HD MWF presents with persistent diarrhea.   Plan: Unasyn 1.5g IV q12h  Monitor culture data, HD plan and clinical course  Height: 5\' 1"  (154.9 cm) Weight: 77 lb 6.1 oz (35.1 kg) IBW/kg (Calculated) : 47.8  Temp (24hrs), Avg:98 F (36.7 C), Min:96.9 F (36.1 C), Max:100.8 F (38.2 C)   Recent Labs Lab 08/19/15 1454 08/20/15 0637 08/21/15 0729 08/22/15 0619 08/23/15 0540  WBC 7.2 8.8 6.2 4.7 4.3  CREATININE 4.08* 4.40* 2.79*  --  4.83*    Estimated Creatinine Clearance: 7 mL/min (by C-G formula based on Cr of 4.83).    Allergies  Allergen Reactions  . Amitriptyline Hcl Swelling    In the face.  . Doxycycline Hyclate Itching    Feels like something crawling under her skin    Antimicrobials this admission: Unasyn 3/20 >>   Dose adjustments this admission: n/a  Microbiology results: 3/17 BCx: ngtd  UCx:    Sputum:    MRSA PCR:  3/17 Cdif: pos antigen and toxin  Andrey Cota. Diona Foley, PharmD, [place] Clinical Pharmacist Pager [phone]  08/23/2015 12:05 PM

## 2015-08-23 NOTE — Clinical Social Work Note (Signed)
Clinical Social Work Assessment  Patient Details  Name: [name] [name] MRN: [id number] Date of Birth: [birth year]  Date of referral:  08/23/15               Reason for consult:  Facility Placement                Permission sought to share information with:  Facility Sport and exercise psychologist, Family Supports Permission granted to share information::  Yes, Verbal Permission Granted  Name::     Arbie Cookey  Agency::  [facility] SNFs  Relationship::  Sister  Contact Information:  [phone]  Housing/Transportation Living arrangements for the past 2 months:  Single Family Home Source of Information:  Patient Patient Interpreter Needed:  None Criminal Activity/Legal Involvement Pertinent to Current Situation/Hospitalization:  No - Comment as needed Significant Relationships:  Adult Children, Siblings Lives with:  Self Do you feel safe going back to the place where you live?  No Need for family participation in patient care:  Yes (Comment)  Care giving concerns:  CSW received referral for possible SNF placement at time of discharge. CSW met with patient regarding SNF placement at time of discharge. Patient reported being currently unable to care for herself at home given patient's current physical needs and fall risk. Patient expressed understanding of PT recommendation and is agreeable to SNF placement at time of discharge. CSW to continue to follow and assist with discharge planning needs.    Social Worker assessment / plan:  CSW spoke with patient concerning possibility of rehab at [facility] before returning home.  Employment status:  Disabled (Comment on whether or not currently receiving Disability) Insurance information:  Managed Medicare PT Recommendations:  24 Hour Supervision Information / Referral to community resources:  [place]  Patient/Family's Response to care:  Patient appears very lethargic and sleepy and only answers questions with a yes or no. Patient  states she does want to go to a SNF but does not know of any.  Patient/Family's Understanding of and Emotional Response to Diagnosis, Current Treatment, and Prognosis:  No questions/concerns.  Emotional Assessment Appearance:  Appears older than stated age Attitude/Demeanor/Rapport:   (Patient very sleepy and not wanting to participate in conversation) Affect (typically observed):  Irritable, Other (Sad) Orientation:  Oriented to Self, Oriented to Place, Oriented to  Time, Oriented to Situation Alcohol / Substance use:  Alcohol Use Psych involvement (Current and /or in the community):  No (Comment)  Discharge Needs  Concerns to be addressed:  Care Coordination Readmission within the last 30 days:  No Current discharge risk:  None Barriers to Discharge:  Continued Medical Work up   [employer], [place] 08/23/2015, 3:14 PM

## 2015-08-24 DIAGNOSIS — K7030 Alcoholic cirrhosis of liver without ascites: Secondary | ICD-10-CM

## 2015-08-24 DIAGNOSIS — R509 Fever, unspecified: Secondary | ICD-10-CM | POA: Insufficient documentation

## 2015-08-24 DIAGNOSIS — D72819 Decreased white blood cell count, unspecified: Secondary | ICD-10-CM

## 2015-08-24 LAB — CBC
HCT: 20 % — ABNORMAL LOW (ref 36.0–46.0)
HEMOGLOBIN: 6.6 g/dL — AB (ref 12.0–15.0)
MCH: 34.9 pg — AB (ref 26.0–34.0)
MCHC: 33 g/dL (ref 30.0–36.0)
MCV: 105.8 fL — ABNORMAL HIGH (ref 78.0–100.0)
PLATELETS: 16 10*3/uL — AB (ref 150–400)
RBC: 1.89 MIL/uL — ABNORMAL LOW (ref 3.87–5.11)
RDW: 22.9 % — AB (ref 11.5–15.5)
WBC: 3.7 10*3/uL — ABNORMAL LOW (ref 4.0–10.5)

## 2015-08-24 LAB — RENAL FUNCTION PANEL
ANION GAP: 11 (ref 5–15)
BUN: 5 mg/dL — AB (ref 6–20)
CALCIUM: 8 mg/dL — AB (ref 8.9–10.3)
CO2: 25 mmol/L (ref 22–32)
CREATININE: 2.99 mg/dL — AB (ref 0.44–1.00)
Chloride: 101 mmol/L (ref 101–111)
GFR calc Af Amer: 19 mL/min — ABNORMAL LOW (ref >60)
GFR calc non Af Amer: 16 mL/min — ABNORMAL LOW (ref >60)
GLUCOSE: 110 mg/dL — AB (ref 65–99)
PHOSPHORUS: 1.5 mg/dL — AB (ref 2.5–4.6)
Potassium: 4.3 mmol/L (ref 3.5–5.1)
SODIUM: 137 mmol/L (ref 135–145)

## 2015-08-24 LAB — PREPARE RBC (CROSSMATCH)

## 2015-08-24 LAB — SAVE SMEAR

## 2015-08-24 MED ORDER — PIPERACILLIN-TAZOBACTAM IN DEX 2-0.25 GM/50ML IV SOLN
2.2500 g | Freq: Three times a day (TID) | INTRAVENOUS | Status: DC
  Filled 2015-08-24 (×5): qty 50

## 2015-08-24 MED ORDER — SODIUM CHLORIDE 0.9 % IV SOLN
Freq: Once | INTRAVENOUS | Status: AC
  Administered 2015-08-28: 18:00:00 via INTRAVENOUS

## 2015-08-24 NOTE — Progress Notes (Signed)
Pharmacy Antibiotic Note  [name] [name] is a 60 y.o. female admitted on 08/19/2015 with persistent diarrhea spiking fevers.  Pharmacy has been consulted for Zosyn dosing. Patient is currently also on Dificid for C.diff. WBC wnl, Tm 100.16F. She receives MWF HD.   Plan: -Start Zosyn 2.25 gm IV Q 8 hours   Height: 5\' 1"  (154.9 cm) Weight: 77 lb 1.6 oz (34.972 kg) IBW/kg (Calculated) : 47.8  Temp (24hrs), Avg:98.7 F (37.1 C), Min:97.9 F (36.6 C), Max:100.1 F (37.8 C)   Recent Labs Lab 08/19/15 1454 08/20/15 0637 08/21/15 0729 08/22/15 0619 08/23/15 0540 08/24/15 0536  WBC 7.2 8.8 6.2 4.7 4.3 3.7*  CREATININE 4.08* 4.40* 2.79*  --  4.83* 2.99*    Estimated Creatinine Clearance: 11.3 mL/min (by C-G formula based on Cr of 2.99).    Allergies  Allergen Reactions  . Amitriptyline Hcl Swelling    In the face.  . Doxycycline Hyclate Itching    Feels like something crawling under her skin    Antimicrobials this admission: 3/21 Zosyn>>   Dose adjustments this admission: None   Microbiology results: 3/20 BCx2>> 3/20 UC>>  C.diff pos  Thank you for allowing pharmacy to be a part of this patient's care.  Albertina Parr, PharmD., BCPS Clinical Pharmacist Pager [phone]

## 2015-08-24 NOTE — Progress Notes (Signed)
[name], female   DOB: 01-25-1956, 60 y.o.   MRN: [id number] Medicine attending: I examined this patient this morning together with resident physician Dr. Zada Finders and I concur with his evaluation and management plan which we discussed together. We were unable to start antibiotics yesterday because the patient has exhausted peripheral vascular access. She continues to have intermittent fevers to 100.8. No obvious source of infection on exam. Chest x-ray with no infiltrate or effusion. Stools are more formed but still frequent. She is eating. She is having intermittent low-grade epistaxis. This remains a concern with her low platelets. Progressive pancytopenia with predominant thrombocytopenia. White count today 3700, hemoglobin 6.6, platelet count 16,000. I reviewed her peripheral blood film. Low platelets confirmed 0-3 per high-power field. Macrocytic red cells with frequent target cells. No schistocytes. No polychromasia. No evidence for a microangiopathic process. #1. Recurrent C. difficile colitis now day 3 Fidaxomicin. Early trend for improvement #2. Pancytopenia with predominant thrombocytopenia This remains a major concern. Possibility of vancomycin toxicity and vancomycin was discontinued but it may take time to see improvement in her counts. We have not given her a platelet transfusion since nosebleeds have been minor and self-limited but I have a low threshold for transfusion if she has persistent or significant bleeding. #3. Persistent fever and progressive leukopenia We will go ahead and have a PICC catheter placed so that we can give her appropriate antibiotics to cover gram-negative enteric bacteria. #4. Systemic lupus Azathioprine on hold in view of pancytopenia. She continues on Plaquenil. #5. End-stage renal disease on dialysis #6. Advanced liver disease/cirrhosis from prior alcohol use Overall medical condition remains complex and unstable.

## 2015-08-24 NOTE — Progress Notes (Signed)
OT Cancellation Note  Patient Details Name: [name] [name] MRN: [id number] DOB: 05-Jun-1956   Cancelled Treatment:     Reason treatment not performed/pt not seen: Cancel medical: Chart review indicates critical labs:Hemoglobin 6.6. Will hold Acute OT treatment session and check back as able.  Carlynn Herald, Amy Beth Dixon, OTR/L 08/24/2015, 9:00 AM

## 2015-08-24 NOTE — Progress Notes (Signed)
   Subjective: Patient reports continued soft diarrhea. She says she is about the same as yesterday. She reports a nosebleed this morning. She denies any blood in the stool. She says her appetite is okay, but does tell us she felt weak yesterday when trying to hold an object in her hands.  Objective: Vital signs in last 24 hours: Filed Vitals:   08/23/15 2236 08/24/15 0556 08/24/15 0630 08/24/15 0900  BP: 90/60 96/70  112/63  Pulse:  81  107  Temp:  98 F (36.7 C)    TempSrc:  Oral    Resp:  16    Height:      Weight:   77 lb 1.6 oz (34.972 kg)   SpO2:  100%     Weight change:  No intake or output data in the 24 hours ending 08/24/15 1059 General: cachectic woman, resting in bed, no acute distress Cardiac: tachycardic, regular rhythm Pulm: clear to auscultation bilaterally Abd: soft, nondistended, hypoactive bowel sounds Ext: thin extremities, no pedal edema   Assessment/Plan: Active Problems:   Hyperparathyroidism, secondary renal (HCC)   Depression   GERD   Chronic pancreatitis (HCC)   Seizure disorder (HCC)   Severe protein-calorie malnutrition (HCC)   Anemia of chronic disease   Folate deficiency   Lupus (systemic lupus erythematosus) (HCC)   ESRD (end stage renal disease) on dialysis (HCC)   Recurrent Clostridium difficile diarrhea   B12 deficiency   Cirrhosis ([place])   Anemia due to bone marrow failure (HCC)   Fever and chills   Pancytopenia (HCC)   Thrombocytopenia (HCC)  Persistent C. Difficile diarrhea: Patient with persistent diarrhea with Tmax of 100.5 yesterday. She is now on Dificid (Fidaxomicin) after failure of oral Vancomycin. We will work to try to assist patient in obtaining this if she is to continue Dificid on discharge. She has continued low-grade fever spikes daily. As patient has been difficult to obtain IV access, we have low threshold to place a central line for IV antibiotics if she has a significant fever spike. For now we are monitoring off  additional antibiotics. Blood cultures so far are negative. -Appreciate ID and Pharmacy assistance -Dificid 200 mg po BID for 10 days (3/19>>3/28) -Will need outpatient ID appointment -Continue oral hydration -Continue lactobacillus 1 g TID -f/u repeat urine/blood cultures -PICC if fever spike to initiate IV abx such as Zosyn   ESRD on MWF HD: Hgb 6.6 this morning with self-limited episode of epistaxis. No other obvious bleeding. Will monitor today with preference to transfuse 2 units PRBCs during next dialysis session tomorrow. 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 She reports an episode of epistaxis this morning. Her lower than baseline thrombocytopenia may be related to the Vancomycin, which we have discontinued. She has also been on immunosuppressants for her lupus which can cause thrombocytopenia. -Holding Azathioprine  -Monitor for bleeding  Chronic pancreatitis -Continue Creon 12,000 units TID  Depression -Continue Prozac 10 mg daily -Continue Mirtazapine 7.5 mg daily    Dispo: Disposition is deferred at this time, awaiting improvement of current medical problems.  Anticipated discharge in approximately 2-3 day(s) to SNF for rehab and completion of Fidaxomicin.     LOS: 5 days   [name] Finders, MD 08/24/2015, 10:59 AM

## 2015-08-24 NOTE — Progress Notes (Signed)
Assessment/Plan: 1. Recurrent Cdiff- off vanc, on fidaxomicin, WBC/ fevers improving, still diarrhea 2. Severe protein calorie malnutrition  3. ESRD - MWF. HD Wed, no heparin 4. Volume - at dry wt, no vol excess 5. SLE- usual meds 6. Thrombocytopenia - severe. Liver dz/SLE, other. Azathioprine held. 7. Depression-  per primary 8. Alcohol abuse 9. Liver disease - Increased Bili 6.0- w/u per primary 10. Seizure disorder - keppra 11. 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 Low phos, no binder, +vit D 13. Anemia of CKD/ chron disease/nosebleed - Hb 6.6, prbc's ordered for tomorrow     Kelly Splinter MD [place] pager [phone]    cell [phone] 08/24/2015, 3:18 PM    [facility]  4h  4k/2.25 bath  34.5kg  LUA AVG  Hep 2000  Qb 350   Mircera 225 last 2/15 Calcitriol 1.25 ug Hb 8.9  pth 298   Subjective: Had nosebleed and Hb 6.6 this am.  NO gross blood in stools.  PRBC's ordered for tomorrow  Objective: Vital signs in last 24 hours: Temp:  [97.9 F (36.6 C)-100.1 F (37.8 C)] 100.1 F (37.8 C) (03/21 1407) Pulse Rate:  [81-117] 96 (03/21 1407) Resp:  [16-18] 18 (03/21 1407) BP: (86-114)/(55-71) 114/71 mmHg (03/21 1407) SpO2:  [100 %] 100 % (03/21 0556) Weight:  [34.972 kg (77 lb 1.6 oz)] 34.972 kg (77 lb 1.6 oz) (03/21 0630) Weight change:   Intake/Output from previous day:   Intake/Output this shift:   Exam Awake, lethargic, slurred speech, very weak No jvd Chest clear bilat RRR no mrg Abd firm, +bs nontender MS no LE edema LUA AVG +bruit Neuro ox 3, nf  Lab Results:  Recent Labs  08/23/15 0540 08/24/15 0536  WBC 4.3 3.7*  HGB 8.0* 6.6*  HCT 24.1* 20.0*  PLT 15* 16*   BMET:   Recent Labs  08/23/15 0540 08/24/15 0536  NA 134* 137  K 3.9 4.3  CL 101 101  CO2 25 25  GLUCOSE 110* 110*  BUN 10 5*  CREATININE 4.83* 2.99*  CALCIUM 7.8* 8.0*   No results for input(s): PTH in the last 72  hours. Iron Studies: No results for input(s): IRON, TIBC, TRANSFERRIN, FERRITIN in the last 72 hours. Studies/Results: Dg Chest 2 View  08/23/2015  CLINICAL DATA:  Fever, chills, diarrhea EXAM: CHEST  2 VIEW COMPARISON:  08/05/2015 FINDINGS: Lungs are clear.  No pleural effusion or pneumothorax. Cardiomegaly. Visualized osseous structures are within normal limits. IMPRESSION: No evidence of acute cardiopulmonary disease. Electronically Signed   By: Julian Hy M.D.   On: 08/23/2015 14:31    Scheduled: . sodium chloride   Intravenous Once  . calcitRIOL  1.25 mcg Oral Q M,W,F-HD  . darbepoetin (ARANESP) injection - DIALYSIS  200 mcg Intravenous Q Fri-HD  . feeding supplement (ENSURE ENLIVE)  237 mL Oral TID WC  . fidaxomicin  200 mg Oral BID  . FLUoxetine  10 mg Oral Daily  . folic acid  1 mg Oral Daily  . gabapentin  300 mg Oral Daily  . hydroxychloroquine  400 mg Oral Daily  . lactobacillus  1 g Oral TID  . levETIRAcetam  250 mg Oral Daily  . levETIRAcetam  500 mg Oral Q M,W,F-1800  . lipase/protease/amylase  12,000 Units Oral TID AC  . mirtazapine  7.5 mg Oral QHS  . multivitamin with minerals  1 tablet Oral Daily  . pantoprazole  40 mg Oral Daily  .  thiamine  100 mg Oral Daily    LOS: 5 days   Lauriel Helin D 08/24/2015,3:17 PM

## 2015-08-24 NOTE — Discharge Summary (Signed)
Name: [name] [name] MRN: [id number] DOB: 1955/08/26 60 y.o. PCP: Shela Leff, MD  Date of Admission: 08/19/2015  5:24 PM Date of Discharge: 09/02/2015 Attending Physician: Annia Belt, MD  Discharge Diagnosis: 1. Persistant C difficile diarrhea 2. 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 Severe protein-calorie malnutrition 4. Chronic thrombocytopenia 5. Chronic pancreatitis Active Problems:   Hyperparathyroidism, secondary renal (HCC)   Depression   GERD   Chronic pancreatitis (HCC)   Seizure disorder (HCC)   Severe protein-calorie malnutrition ([place])   Anemia of chronic disease   Folate deficiency   Lupus (systemic lupus erythematosus) (HCC)   ESRD (end stage renal disease) on dialysis (HCC)   Recurrent Clostridium difficile diarrhea   B12 deficiency   Cirrhosis ([place])   Anemia due to bone marrow failure (HCC)   Fever and chills   Pancytopenia (HCC)   Thrombocytopenia (HCC)   Persistent fever   Alcoholic cirrhosis of liver without ascites (HCC)   Pressure ulcer   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 Take 2 (two) tablets by mouth three times daily     albuterol 108 (90 Base) MCG/ACT inhaler  Commonly known as:  PROAIR HFA  Inhale 1-2 puffs into the lungs every 6 (six) hours as needed for wheezing or shortness of breath.     calcium carbonate 500 MG chewable tablet  Commonly known as:  TUMS - dosed in mg elemental calcium  Chew 1,000 tablets by mouth at bedtime.     camphor-menthol lotion  Commonly known as:  SARNA  Apply 1 application topically daily as needed for itching.     cetirizine 10 MG tablet  Commonly known as:  ZYRTEC  Take 1 tablet (10 mg total)  by mouth daily as needed for allergies.     CREON 12000 units Cpep capsule  Generic drug:  lipase/protease/amylase  Take 1 capsule (12,000 Units total) by mouth 3 (three) times daily before meals.     Darbepoetin Alfa 150 MCG/0.3ML Sosy injection  Commonly known as:  ARANESP  Inject 0.3 mLs (150 mcg total) into the vein every Wednesday with hemodialysis.     feeding supplement (ENSURE ENLIVE) Liqd  Take 237 mLs by mouth 3 (three) times daily with meals.     FLUoxetine 10 MG capsule  Commonly known as:  PROZAC  Take 1 capsule (10 mg total) by mouth daily. For depression     folic acid 1 MG tablet  Commonly known as:  FOLVITE  Take 1 tablet (1 mg total) by mouth daily. For folic acid replacement     gabapentin 300 MG capsule  Commonly known as:  NEURONTIN  Take 1 capsule (300 mg total) by mouth daily.     hydroxychloroquine 200 MG tablet  Commonly known as:  PLAQUENIL  Take 400 mg by mouth daily.     levETIRAcetam 250 MG tablet  Commonly known as:  KEPPRA  Take 1 tablet (250 mg total) by mouth daily. Take additional 500mg  (2 tablets) after Dialysis treatments on Monday, Wednesday, Friday     mirtazapine 7.5 MG tablet  Commonly known  as:  REMERON  Take 1 tablet (7.5 mg total) by mouth at bedtime.     multivitamin with minerals Tabs tablet  Take 1 tablet by mouth daily. For vitamin replacement     omeprazole 40 MG capsule  Commonly known as:  PRILOSEC  Take 1 capsule (40 mg total) by mouth daily.     thiamine 100 MG tablet  Commonly known as:  VITAMIN B-1  Take 1 tablet (100 mg total) by mouth daily. For low thiamine     vitamin B-12 250 MCG tablet  Commonly known as:  CYANOCOBALAMIN  Take 1 tablet (250 mcg total) by mouth every evening.        Disposition and follow-up:   [name] was discharged from [facility] in [place] condition.  At the hospital follow up visit please address:  1.   C difficile: Completed Dificid (Fidaxomicin) on  08/31/15. Please assess bowel movements and that she follows up with scheduled Infectious Disease appointment.  ESRD on HD: Will need transportation to HD sessions. Regular schedule is MWF.  2.  Labs / imaging needed at time of follow-up: Renal function panel with HD   3.  Pending labs/ test needing follow-up: none  Follow-up Appointments: Follow-up Information    Follow up with Carlyle Basques, MD. Go on 09/08/2015.   Specialty:  Infectious Diseases   Why:  at 9:00 am   Contact information:   [place] [address] [phone]       Schedule an appointment as soon as possible for a visit with Shela Leff, MD.   Specialty:  Internal Medicine   Contact information:   [place] [id number] [phone]       Follow up with [facility] .   Specialty:  [place] information:   [address] [place] [phone]      Discharge Instructions: Discharge Instructions    AMB Referral to [facility] Management    Complete by:  As directed   Reason for consult:  Patient is high risk for re-admission, monitoring for post skilled facility needs - active with [facility] prior to admission  Diagnoses of:  Kidney Failure  Expected date of contact:  1-3 days (reserved for hospital discharges)  Please assign patient to social worker for post hospital follow up at skilled facility. To discharge to [place]  Please assign to community nurse for transition of care calls when returning to home, was previously assigned -  and assess for home visits. Questions please call:   Natividad Brood, RN BSN [facility] Liaison  [phone] business mobile phone Toll free office [phone]     Call MD for:  difficulty breathing, headache or visual disturbances    Complete by:  As directed      Call MD for:  extreme fatigue    Complete by:  As directed       Call MD for:  persistant dizziness or light-headedness    Complete by:  As directed      Call MD for:  persistant nausea and vomiting    Complete by:  As directed      Call MD for:  severe uncontrolled pain    Complete by:  As directed      Diet - low sodium heart healthy    Complete by:  As directed      Increase activity  slowly    Complete by:  As directed            Consultations: Treatment Team:  Corliss Parish, MD Estanislado Emms, MD  Procedures Performed:  Dg Chest 2 View  08/23/2015  CLINICAL DATA:  Fever, chills, diarrhea EXAM: CHEST  2 VIEW COMPARISON:  08/05/2015 FINDINGS: Lungs are clear.  No pleural effusion or pneumothorax. Cardiomegaly. Visualized osseous structures are within normal limits. IMPRESSION: No evidence of acute cardiopulmonary disease. Electronically Signed   By: Julian Hy M.D.   On: 08/23/2015 14:31   Dg Chest 2 View  08/05/2015  CLINICAL DATA:  Cough and fever for 2 days EXAM: CHEST  2 VIEW COMPARISON:  July 30, 2015 FINDINGS: Lungs are somewhat hyperexpanded. There is no edema or consolidation. The heart size and pulmonary vascularity are normal. No adenopathy. No bone lesions. IMPRESSION: Lungs hyperexpanded.  No edema or consolidation. Electronically Signed   By: Lowella Grip III M.D.   On: 08/05/2015 12:44   Ct Abdomen Pelvis W Contrast  08/29/2015  CLINICAL DATA:  C .  Diff colitis under treatment, persistent fever EXAM: CT ABDOMEN AND PELVIS WITH CONTRAST TECHNIQUE: Multidetector CT imaging of the abdomen and pelvis was performed using the standard protocol following bolus administration of intravenous contrast. CONTRAST:  1 ISOVUE-300 IOPAMIDOL (ISOVUE-300) INJECTION 61% COMPARISON:  07/30/2015 FINDINGS: Lower chest: There is small left pleural effusion. Atelectasis noted in left lower lobe posteriorly. Hepatobiliary: Again noted heterogeneous enhancement of the liver with nodular contour suspicious for cirrhosis. No focal hepatic  mass. No intrahepatic biliary ductal dilatation. No calcified gallstones are noted within gallbladder. There is small perihepatic ascites. Pancreas: Again noted multiple pancreatic calcification and dilatation of main pancreatic duct consistent with chronic calcific pancreatitis. No definite focal mass. Spleen: No focal mass.  Trace perisplenic ascites. Adrenals/Urinary Tract: No adrenal gland mass. Enhanced kidneys are symmetrical in size. No hydronephrosis or hydroureter. Mild delay excretion bilateral kidney on delayed images. No focal hepatic mass. Stomach/Bowel: No gastric outlet obstruction. There is no small bowel obstruction. Contrast material noted within colon and rectum. Small free fluid noted in right paracolic gutter. There is small two moderate pelvic ascites. There is residual mild thickening of descending colon and proximal sigmoid colon wall. Findings probable due to improving colitis. There is no evidence of colonic perforation. No pericolonic abscess or contrast material. Again noted status post right hemicolectomy. No evidence of anastomotic stricture. Vascular/Lymphatic: No aortic aneurysm. No mesenteric adenopathy. No inguinal adenopathy. Small retroperitoneal lymph nodes are stable. Portal vein measures 7.4 mm in diameter without evidence of portal hypertension. Reproductive: The uterus and ovaries are unremarkable. No adnexal mass. Other: There is no evidence of free abdominal air. Small nonspecific bilateral inguinal lymph nodes. Musculoskeletal: No destructive bony lesions are noted. No acute fractures. IMPRESSION: 1. There is small left pleural effusion with left lower lobe posterior atelectasis. 2. Again noted heterogeneous enhancement of the liver with mild nodular contour suspicious for cirrhosis. No focal hepatic mass. Mild perihepatic and perisplenic ascites. Mild ascites in right paracolic gutter. 3. Moderate pelvic ascites. 4. No small bowel obstruction. 5. Again noted chronic  calcific pancreatitis and chronic dilatation of main pancreatic duct. 6. No hydronephrosis or hydroureter. Mild delay excretion bilateral kidneys. 7. No colonic obstruction. There is residual mild thickening of distal colonic wall probable improving colitis. No evidence of pericolonic abscess or perforation. No extraluminal contrast material is noted. Electronically Signed   By: Lahoma Crocker M.D.   On: 08/29/2015 13:14  Ir Fluoro Guide Cv Line Right  08/26/2015  INDICATION: 60 year old with persistent fever and diarrhea. EXAM: FLUOROSCOPIC AND ULTRASOUND GUIDED PLACEMENT OF A CENTRAL VENOUS CATHETER Physician: Stephan Minister. Henn, MD FLUOROSCOPY TIME:  24 seconds, 1 mGy MEDICATIONS: None ANESTHESIA/SEDATION: None PROCEDURE: Informed consent was obtained for placement of a central venous catheter. The patient was placed supine on the interventional table. Ultrasound confirmed a patent right internal jugularvein. Ultrasound images were obtained for documentation. The right side of the neck was prepped and draped in a sterile fashion. The right side of the neck was anesthetized with 1% lidocaine. Maximal barrier sterile technique was utilized including caps, mask, sterile gowns, sterile gloves, sterile drape, hand hygiene and skin antiseptic. A small incision was made with #11 blade scalpel. A 21 gauge needle directed into the right internal jugular vein with ultrasound guidance. A wire was advanced into the central venous system and a peel-away sheath was placed. A dual-lumen Power PICC line was cut to 13 cm. PICC line was advanced through the peel-away sheath and positioned at the superior cavoatrial junction. Both lumens aspirated and flushed well. Catheter was sutured to the neck. A dressing was placed. Fluoroscopic and ultrasound images were taken and saved for documentation. FINDINGS: Catheter tip at the superior cavoatrial junction. COMPLICATIONS: None IMPRESSION: Successful placement of a central venous catheter  using ultrasound and fluoroscopic guidance. Electronically Signed   By: Markus Daft M.D.   On: 08/26/2015 18:03   Ir US Guide Vasc Access Left  09/01/2015  INDICATION: End-stage renal disease, occluded left upper arm AV graft EXAM: RADIOLOGY EXAMINATION; IR ULTRASOUND GUIDANCE VASC ACCESS LEFT MEDICATIONS: 1% lidocaine locally, 2 mg tPA intragraft, 0.5 mg Versed, 12.5 mcg fentanyl ANESTHESIA/SEDATION: Moderate Sedation Time:  30 The patient was continuously monitored during the procedure by the interventional radiology nurse under my direct supervision. FLUOROSCOPY TIME:  Fluoroscopy Time: 4 minutes 48 seconds (4 mGy). COMPLICATIONS: None immediate. PROCEDURE: Informed written consent was obtained from the patient after a thorough discussion of the procedural risks, benefits and alternatives. All questions were addressed. Maximal Sterile Barrier Technique was utilized including caps, mask, sterile gowns, sterile gloves, sterile drape, hand hygiene and skin antiseptic. A timeout was performed prior to the initiation of the procedure. Under sterile conditions and local anesthesia, ultrasound micropuncture access performed of the left upper extremity AV graft. Access performed at 2 separate sites along the arterial side. Two 6 French sheaths inserted. Contrast injection confirms thrombosis of the access. Catheter and guidewire advanced across the venous outflow. Initial outflow venogram confirms patency of the central veins. Pull-back venogram confirms stenosis of venous anastomosis to the high brachial vein. Thrombotic occlusion of the graft evident. 2 mg tPA instilled for thrombo lysis. Mechanical thrombectomy performed throughout the graft with the tretola device. Graft inflow re-established by passing a 5.5 Fogarty catheter over a Glidewire across the arterial anastomosis 3 times. Both sheaths were back bled and syringe aspirated. Additional mechanical thrombectomy performed throughout the graft. Overlapping 7 mm  angioplasty performed of the venous anastomosis. Two prolonged inflations were performed for approximately 45 seconds each to 20 atmospheres. Balloon was deflated and removed. Final shuntogram performed. Shuntogram: Following thrombolysis, mechanical thrombectomy, and 7 mm venous anastomotic angioplasty, the left upper arm AV graft is now patent. There is brisk graft flow throughout the venous limb into the outflow veins. No significant residual thrombus, stenosis or occlusion. Access sheaths removed. Hemostasis obtained with pursestring sutures. Sterile dressings applied. No immediate complication. Patient tolerated the procedure well. IMPRESSION:  Successful left upper arm AV graft thrombolysis, thrombectomy, and 7 mm venous angioplasty to restore flow. Access ready for use. ACCESS: This access remains amenable to future percutaneous interventions as clinically indicated. Electronically Signed   By: Jerilynn Mages.  Shick M.D.   On: 09/01/2015 13:52   Ir US Guide Vasc Access Right  08/26/2015  INDICATION: 60 year old with persistent fever and diarrhea. EXAM: FLUOROSCOPIC AND ULTRASOUND GUIDED PLACEMENT OF A CENTRAL VENOUS CATHETER Physician: Stephan Minister. Henn, MD FLUOROSCOPY TIME:  24 seconds, 1 mGy MEDICATIONS: None ANESTHESIA/SEDATION: None PROCEDURE: Informed consent was obtained for placement of a central venous catheter. The patient was placed supine on the interventional table. Ultrasound confirmed a patent right internal jugularvein. Ultrasound images were obtained for documentation. The right side of the neck was prepped and draped in a sterile fashion. The right side of the neck was anesthetized with 1% lidocaine. Maximal barrier sterile technique was utilized including caps, mask, sterile gowns, sterile gloves, sterile drape, hand hygiene and skin antiseptic. A small incision was made with #11 blade scalpel. A 21 gauge needle directed into the right internal jugular vein with ultrasound guidance. A wire was advanced  into the central venous system and a peel-away sheath was placed. A dual-lumen Power PICC line was cut to 13 cm. PICC line was advanced through the peel-away sheath and positioned at the superior cavoatrial junction. Both lumens aspirated and flushed well. Catheter was sutured to the neck. A dressing was placed. Fluoroscopic and ultrasound images were taken and saved for documentation. FINDINGS: Catheter tip at the superior cavoatrial junction. COMPLICATIONS: None IMPRESSION: Successful placement of a central venous catheter using ultrasound and fluoroscopic guidance. Electronically Signed   By: Markus Daft M.D.   On: 08/26/2015 18:03   Ir Thrombectomy Av Fistula W/thrombolysis/pta Inc/shunt/img Left  09/01/2015  INDICATION: End-stage renal disease, occluded left upper arm AV graft EXAM: RADIOLOGY EXAMINATION; IR ULTRASOUND GUIDANCE VASC ACCESS LEFT MEDICATIONS: 1% lidocaine locally, 2 mg tPA intragraft, 0.5 mg Versed, 12.5 mcg fentanyl ANESTHESIA/SEDATION: Moderate Sedation Time:  30 The patient was continuously monitored during the procedure by the interventional radiology nurse under my direct supervision. FLUOROSCOPY TIME:  Fluoroscopy Time: 4 minutes 48 seconds (4 mGy). COMPLICATIONS: None immediate. PROCEDURE: Informed written consent was obtained from the patient after a thorough discussion of the procedural risks, benefits and alternatives. All questions were addressed. Maximal Sterile Barrier Technique was utilized including caps, mask, sterile gowns, sterile gloves, sterile drape, hand hygiene and skin antiseptic. A timeout was performed prior to the initiation of the procedure. Under sterile conditions and local anesthesia, ultrasound micropuncture access performed of the left upper extremity AV graft. Access performed at 2 separate sites along the arterial side. Two 6 French sheaths inserted. Contrast injection confirms thrombosis of the access. Catheter and guidewire advanced across the venous  outflow. Initial outflow venogram confirms patency of the central veins. Pull-back venogram confirms stenosis of venous anastomosis to the high brachial vein. Thrombotic occlusion of the graft evident. 2 mg tPA instilled for thrombo lysis. Mechanical thrombectomy performed throughout the graft with the tretola device. Graft inflow re-established by passing a 5.5 Fogarty catheter over a Glidewire across the arterial anastomosis 3 times. Both sheaths were back bled and syringe aspirated. Additional mechanical thrombectomy performed throughout the graft. Overlapping 7 mm angioplasty performed of the venous anastomosis. Two prolonged inflations were performed for approximately 45 seconds each to 20 atmospheres. Balloon was deflated and removed. Final shuntogram performed. Shuntogram: Following thrombolysis, mechanical thrombectomy, and 7 mm venous anastomotic angioplasty,  the left upper arm AV graft is now patent. There is brisk graft flow throughout the venous limb into the outflow veins. No significant residual thrombus, stenosis or occlusion. Access sheaths removed. Hemostasis obtained with pursestring sutures. Sterile dressings applied. No immediate complication. Patient tolerated the procedure well. IMPRESSION: Successful left upper arm AV graft thrombolysis, thrombectomy, and 7 mm venous angioplasty to restore flow. Access ready for use. ACCESS: This access remains amenable to future percutaneous interventions as clinically indicated. Electronically Signed   By: Jerilynn Mages.  Shick M.D.   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 She was recently admitted on 08/05/15 for C. Diff diarrhea and Influenza A. Patient states she has been taking vancomycin regularly since  her discharge on 08/07/15 and finished the course of antibiotic 2-3 days ago. States she did not have any improvement in her diarrhea despite being compliant with the medication and still continues to have severe diarrhea which she describes as brown in color and mixed with white mucus-like material. Denies having any bloody diarrhea. Reports having mild occasional lower abdominal pain. States she does not have any nausea/ vomiting and continues to have adequate oral intake. However, does report feeling tired. States she was due for dialysis yesterday but could not go since she woke up covered in feces  Hospital Course by problem list: Active Problems:   Hyperparathyroidism, secondary renal (HCC)   Depression   GERD   Chronic pancreatitis (HCC)   Seizure disorder (HCC)   Severe protein-calorie malnutrition ([place])   Anemia of chronic disease   Folate deficiency   Lupus (systemic lupus erythematosus) ([place])   ESRD (end stage renal disease) on dialysis ([place])   Recurrent Clostridium difficile diarrhea   B12 deficiency   Cirrhosis ([place])   Anemia due to bone marrow failure (HCC)   Fever and chills   Pancytopenia (HCC)   Thrombocytopenia (HCC)   Persistent fever   Alcoholic cirrhosis of liver without ascites (HCC)   Pressure ulcer   AV fistula thrombosis (HCC)   Persistent C difficile diarrhea with fevers: Patient with persistent diarrhea despite completion of oral Vancomycin antibiotic course. She had fevers with Tmax of 103.33F during admission. She was started on prolonged oral Vancomycin taper, however her platelet count began to fall below her already low baseline from chronic thrombocytopenia. This was thought secondary to Vancomycin use and she was switched to Fidaxomicin 200 mg BID for a 10 day course per Infectious Disease recommendations. She had daily fever spikes. IV Zosyn was empirically started. CT abdomen/pelvis was without abscess and showed some improvement of colitis. Fever curve  improved and Zosyn was discontinued after 5 days. Blood cultures were negative. She had continued loose semi-formed stool, but overall was improving. She was afebrile for 5 days before discharge. She will follow up with Infectious Disease outpatient.  Severe thrombocytopenia: Patient with chronic thrombocytopenia, with platelets lowest at 15-16K during admission, improved to ~40K on discharge. She had intermittent episodes of epistaxis, none for several days prior to discharge. Her lower than baseline thrombocytopenia thought related to oral Vancomycin, which was discontinued. Also on immunosuppressants for her lupus which may cause thrombocytopenia. Azathioprine was held.  ESRD on MWF HD: Had initial catch up HD sessions than  received regular schedule dialysis per Nephrology. On 08/30/15, AVF malfunctioned during dialysis with possible clotting off. IR consulted with successful declot and patient resumed regular HD same day (09/01/15).  SLE: Thought less likely to have lupus flare as dsDNA was not elevated. -Continued Plaquenil 400 mg daily  -Held Azathiopine 50 mg daily in setting of severe thrombocytopenia   Discharge Vitals:   BP 103/56 mmHg  Pulse 66  Temp(Src) 97.4 F (36.3 C) (Oral)  Resp 16  Ht 5\' 1"  (1.549 m)  Wt 80 lb 14.5 oz (36.7 kg)  BMI 15.30 kg/m2  SpO2 100%  Discharge Labs:  Results for orders placed or performed during the hospital encounter of 08/19/15 (from the past 24 hour(s))  CBC     Status: Abnormal   Collection Time: 09/02/15  5:45 AM  Result Value Ref Range   WBC 9.0 4.0 - 10.5 K/uL   RBC 2.93 (L) 3.87 - 5.11 MIL/uL   Hemoglobin 9.4 (L) 12.0 - 15.0 g/dL   HCT 29.5 (L) 36.0 - 46.0 %   MCV 100.7 (H) 78.0 - 100.0 fL   MCH 32.1 26.0 - 34.0 pg   MCHC 31.9 30.0 - 36.0 g/dL   RDW 23.0 (H) 11.5 - 15.5 %   Platelets 41 (L) 150 - 400 K/uL  Basic metabolic panel     Status: Abnormal   Collection Time: 09/02/15  5:45 AM  Result Value Ref Range   Sodium 136 135 - 145  mmol/L   Potassium 4.0 3.5 - 5.1 mmol/L   Chloride 105 101 - 111 mmol/L   CO2 23 22 - 32 mmol/L   Glucose, Bld 121 (H) 65 - 99 mg/dL   BUN <5 (L) 6 - 20 mg/dL   Creatinine, Ser 3.82 (H) 0.44 - 1.00 mg/dL   Calcium 7.8 (L) 8.9 - 10.3 mg/dL   GFR calc non Af Amer 12 (L) >60 mL/min   GFR calc Af Amer 14 (L) >60 mL/min   Anion gap 8 5 - 15    Signed: Zada Finders, MD 09/02/2015, 11:04 AM    Services Ordered on Discharge: SNF Equipment Ordered on Discharge: Rolling walker

## 2015-08-24 NOTE — Progress Notes (Signed)
Lab notified nurse of critical Hemoglobin 6.6. Dr Posey Pronto notified. No new orders at this time. Will continue to monitor pt.

## 2015-08-24 NOTE — Progress Notes (Signed)
Nutrition Follow-up  DOCUMENTATION CODES:   Severe malnutrition in context of chronic illness, Underweight  INTERVENTION:   -Continue Ensure Enlive po TID, each supplement provides 350 kcal and 20 grams of protein  NUTRITION DIAGNOSIS:   Malnutrition related to chronic illness as evidenced by severe depletion of muscle mass, severe depletion of body fat.  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 She was recently admitted on 08/05/15 for C. Diff diarrhea and Influenza A. Patient states she has been taking vancomycin regularly since her discharge on 08/07/15 and finished the course of antibiotic 2-3 days ago. States she did not have any improvement in her diarrhea despite being compliant with the medication and still continues to have severe diarrhea which she describes as brown in color and mixed with white mucus-like material. Denies having any bloody diarrhea. Reports having mild occasional lower abdominal pain. States she does not have any nausea/ vomiting and continues to have adequate oral intake. However, does report feeling tired. States she was due for dialysis yesterday but could not go since she woke up covered in feces.   Pt sleeping soundly at time of visit, with multiple blankets covering all but her head.  Staff reports that pt's oral intake has improved. Noted cup of Ensure at bedside; pt consumed approximately 50% of contents in styrofoam cup.   Pt still with loose BMs, however, stools have  been more formed.   Pt's last HD was 08/23/15.   CSW following. Plan is to d/c to SNF once medically stable.   Labs reviewed.   Diet Order:  Diet regular Room service appropriate?: Yes; Fluid consistency:: Thin; Fluid restriction:: 1200 mL Fluid  Skin:  Reviewed, no issues  Last BM:  08/23/15  Height:   Ht Readings from Last 1 Encounters:  08/19/15 5\' 1"  (1.549 m)    Weight:   Wt Readings from Last 1 Encounters:  08/24/15 77 lb 1.6 oz (34.972 kg)    Ideal Body Weight:  47.7 kg  BMI:  Body mass index is 14.58 kg/(m^2).  Estimated Nutritional Needs:   Kcal:  1300-1500  Protein:  60-75 grams  Fluid:  >1.3 L  EDUCATION NEEDS:   Education needs addressed  Prinston Kynard A. Jimmye Norman, RD, LDN, CDE Pager: [phone] After hours Pager: [phone]

## 2015-08-24 NOTE — Progress Notes (Signed)
Physical Therapy Treatment Patient Details Name: [name] [name] MRN: [id number] DOB: 07-20-1955 Today's Date: 08/24/2015    History of Present Illness 60 y.o. female admitted for weakness, nausea, and chronic diarrhea. 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     PT Comments    Pt required increased assist presents with increased weakness.  Will notify supervising PT of need for change in d/c recommendations.    Follow Up Recommendations  SNF     Equipment Recommendations  None recommended by PT    Recommendations for Other Services       Precautions / Restrictions Precautions Precautions: Fall Restrictions Weight Bearing Restrictions: No    Mobility  Bed Mobility Overal bed mobility: Needs Assistance Bed Mobility: Supine to Sit     Supine to sit: Min assist Sit to supine: Supervision   General bed mobility comments: Pt required assist to elevate trunk into sitting edge of bed.  Pt performed with use of rails.    Transfers Overall transfer level: Needs assistance Equipment used: Rolling walker (2 wheeled) Transfers: Sit to/from Stand Sit to Stand: Min guard;Min assist         General transfer comment: Pt performed with cues to push from seated surface and extend B knees to avoid buckling.    Ambulation/Gait Ambulation/Gait assistance: Mod assist Ambulation Distance (Feet): 35 Feet Assistive device: Rolling walker (2 wheeled) Gait Pattern/deviations: Step-through pattern;Decreased stride length;Narrow base of support Gait velocity: decreased   General Gait Details: B buckling noted required mod assist to correct.  Pt required use of RW due to unsteady gait sequencing and new onset of B knee buckling.     Stairs            Wheelchair  Mobility    Modified Rankin (Stroke Patients Only)       Balance     Sitting balance-Leahy Scale: Good       Standing balance-Leahy Scale: Fair                      Cognition Arousal/Alertness: Awake/alert Behavior During Therapy: Flat affect Overall Cognitive Status: Within Functional Limits for tasks assessed                      Exercises      General Comments        Pertinent Vitals/Pain Pain Assessment: No/denies pain    Home Living                      Prior Function            PT Goals (current goals can now be found in the care plan section) Acute Rehab PT Goals Patient Stated Goal: to get better Potential to Achieve Goals: Good Progress towards PT goals: Not progressing toward goals - comment (Pt's mobility regressed.  )    Frequency  Min 3X/week    PT Plan      Co-evaluation             End of Session Equipment Utilized During Treatment: Gait belt Activity Tolerance: Patient tolerated treatment well Patient left: in bed;with call bell/phone within reach;with bed alarm set     Time: [id number] PT Time Calculation (min) (ACUTE ONLY): 27 min  Charges:  $Gait Training: 8-22 mins $Therapeutic  Activity: 8-22 mins                    G Codes:      Cristela Blue 2015/09/09, 4:45 PM  Governor Rooks, PTA pager [phone]

## 2015-08-25 DIAGNOSIS — F445 Conversion disorder with seizures or convulsions: Secondary | ICD-10-CM | POA: Diagnosis not present

## 2015-08-25 DIAGNOSIS — Z9981 Dependence on supplemental oxygen: Secondary | ICD-10-CM | POA: Diagnosis not present

## 2015-08-25 DIAGNOSIS — K7030 Alcoholic cirrhosis of liver without ascites: Secondary | ICD-10-CM | POA: Diagnosis not present

## 2015-08-25 DIAGNOSIS — E43 Unspecified severe protein-calorie malnutrition: Secondary | ICD-10-CM | POA: Diagnosis not present

## 2015-08-25 DIAGNOSIS — E46 Unspecified protein-calorie malnutrition: Secondary | ICD-10-CM | POA: Diagnosis not present

## 2015-08-25 DIAGNOSIS — I5032 Chronic diastolic (congestive) heart failure: Secondary | ICD-10-CM | POA: Diagnosis not present

## 2015-08-25 DIAGNOSIS — Z79899 Other long term (current) drug therapy: Secondary | ICD-10-CM | POA: Diagnosis not present

## 2015-08-25 DIAGNOSIS — E872 Acidosis: Secondary | ICD-10-CM | POA: Diagnosis not present

## 2015-08-25 DIAGNOSIS — D631 Anemia in chronic kidney disease: Secondary | ICD-10-CM | POA: Diagnosis not present

## 2015-08-25 DIAGNOSIS — G4700 Insomnia, unspecified: Secondary | ICD-10-CM | POA: Diagnosis not present

## 2015-08-25 DIAGNOSIS — T82818A Embolism of vascular prosthetic devices, implants and grafts, initial encounter: Secondary | ICD-10-CM | POA: Diagnosis not present

## 2015-08-25 DIAGNOSIS — K7031 Alcoholic cirrhosis of liver with ascites: Secondary | ICD-10-CM | POA: Diagnosis present

## 2015-08-25 DIAGNOSIS — Y832 Surgical operation with anastomosis, bypass or graft as the cause of abnormal reaction of the patient, or of later complication, without mention of misadventure at the time of the procedure: Secondary | ICD-10-CM | POA: Diagnosis not present

## 2015-08-25 DIAGNOSIS — L8990 Pressure ulcer of unspecified site, unspecified stage: Secondary | ICD-10-CM | POA: Diagnosis not present

## 2015-08-25 DIAGNOSIS — R040 Epistaxis: Secondary | ICD-10-CM | POA: Diagnosis not present

## 2015-08-25 DIAGNOSIS — I120 Hypertensive chronic kidney disease with stage 5 chronic kidney disease or end stage renal disease: Secondary | ICD-10-CM | POA: Diagnosis not present

## 2015-08-25 DIAGNOSIS — F339 Major depressive disorder, recurrent, unspecified: Secondary | ICD-10-CM | POA: Diagnosis not present

## 2015-08-25 DIAGNOSIS — N186 End stage renal disease: Secondary | ICD-10-CM | POA: Diagnosis not present

## 2015-08-25 DIAGNOSIS — K7290 Hepatic failure, unspecified without coma: Secondary | ICD-10-CM | POA: Diagnosis not present

## 2015-08-25 DIAGNOSIS — E538 Deficiency of other specified B group vitamins: Secondary | ICD-10-CM | POA: Diagnosis not present

## 2015-08-25 DIAGNOSIS — K861 Other chronic pancreatitis: Secondary | ICD-10-CM | POA: Diagnosis not present

## 2015-08-25 DIAGNOSIS — K860 Alcohol-induced chronic pancreatitis: Secondary | ICD-10-CM | POA: Diagnosis not present

## 2015-08-25 DIAGNOSIS — D649 Anemia, unspecified: Secondary | ICD-10-CM | POA: Diagnosis not present

## 2015-08-25 DIAGNOSIS — K219 Gastro-esophageal reflux disease without esophagitis: Secondary | ICD-10-CM | POA: Diagnosis not present

## 2015-08-25 DIAGNOSIS — Z681 Body mass index (BMI) 19 or less, adult: Secondary | ICD-10-CM | POA: Diagnosis not present

## 2015-08-25 DIAGNOSIS — J449 Chronic obstructive pulmonary disease, unspecified: Secondary | ICD-10-CM | POA: Diagnosis not present

## 2015-08-25 DIAGNOSIS — N269 Renal sclerosis, unspecified: Secondary | ICD-10-CM | POA: Diagnosis not present

## 2015-08-25 DIAGNOSIS — E781 Pure hyperglyceridemia: Secondary | ICD-10-CM | POA: Diagnosis not present

## 2015-08-25 DIAGNOSIS — A047 Enterocolitis due to Clostridium difficile: Secondary | ICD-10-CM | POA: Diagnosis not present

## 2015-08-25 DIAGNOSIS — M6281 Muscle weakness (generalized): Secondary | ICD-10-CM | POA: Diagnosis not present

## 2015-08-25 DIAGNOSIS — F329 Major depressive disorder, single episode, unspecified: Secondary | ICD-10-CM | POA: Diagnosis not present

## 2015-08-25 DIAGNOSIS — D61818 Other pancytopenia: Secondary | ICD-10-CM | POA: Diagnosis not present

## 2015-08-25 DIAGNOSIS — Z87891 Personal history of nicotine dependence: Secondary | ICD-10-CM | POA: Diagnosis not present

## 2015-08-25 DIAGNOSIS — Z452 Encounter for adjustment and management of vascular access device: Secondary | ICD-10-CM | POA: Diagnosis not present

## 2015-08-25 DIAGNOSIS — N2581 Secondary hyperparathyroidism of renal origin: Secondary | ICD-10-CM | POA: Diagnosis not present

## 2015-08-25 DIAGNOSIS — T82868A Thrombosis of vascular prosthetic devices, implants and grafts, initial encounter: Secondary | ICD-10-CM | POA: Diagnosis not present

## 2015-08-25 DIAGNOSIS — R64 Cachexia: Secondary | ICD-10-CM | POA: Diagnosis not present

## 2015-08-25 DIAGNOSIS — T426X5A Adverse effect of other antiepileptic and sedative-hypnotic drugs, initial encounter: Secondary | ICD-10-CM | POA: Diagnosis not present

## 2015-08-25 DIAGNOSIS — L932 Other local lupus erythematosus: Secondary | ICD-10-CM | POA: Diagnosis not present

## 2015-08-25 DIAGNOSIS — I132 Hypertensive heart and chronic kidney disease with heart failure and with stage 5 chronic kidney disease, or end stage renal disease: Secondary | ICD-10-CM | POA: Diagnosis not present

## 2015-08-25 DIAGNOSIS — M3214 Glomerular disease in systemic lupus erythematosus: Secondary | ICD-10-CM | POA: Diagnosis not present

## 2015-08-25 DIAGNOSIS — R278 Other lack of coordination: Secondary | ICD-10-CM | POA: Diagnosis not present

## 2015-08-25 DIAGNOSIS — G40909 Epilepsy, unspecified, not intractable, without status epilepticus: Secondary | ICD-10-CM | POA: Diagnosis not present

## 2015-08-25 DIAGNOSIS — Z992 Dependence on renal dialysis: Secondary | ICD-10-CM | POA: Diagnosis not present

## 2015-08-25 DIAGNOSIS — R509 Fever, unspecified: Secondary | ICD-10-CM | POA: Diagnosis not present

## 2015-08-25 DIAGNOSIS — Z881 Allergy status to other antibiotic agents status: Secondary | ICD-10-CM | POA: Diagnosis not present

## 2015-08-25 DIAGNOSIS — D696 Thrombocytopenia, unspecified: Secondary | ICD-10-CM | POA: Diagnosis not present

## 2015-08-25 LAB — CULTURE, BLOOD (ROUTINE X 2)
CULTURE: NO GROWTH
Culture: NO GROWTH

## 2015-08-25 LAB — BASIC METABOLIC PANEL
Anion gap: 8 (ref 5–15)
BUN: 8 mg/dL (ref 6–20)
CALCIUM: 8 mg/dL — AB (ref 8.9–10.3)
CO2: 26 mmol/L (ref 22–32)
CREATININE: 3.97 mg/dL — AB (ref 0.44–1.00)
Chloride: 101 mmol/L (ref 101–111)
GFR, EST AFRICAN AMERICAN: 13 mL/min — AB (ref >60)
GFR, EST NON AFRICAN AMERICAN: 11 mL/min — AB (ref >60)
GLUCOSE: 69 mg/dL (ref 65–99)
Potassium: 4.8 mmol/L (ref 3.5–5.1)
Sodium: 135 mmol/L (ref 135–145)

## 2015-08-25 LAB — CBC
HEMATOCRIT: 20.6 % — AB (ref 36.0–46.0)
Hemoglobin: 6.6 g/dL — CL (ref 12.0–15.0)
MCH: 34.6 pg — ABNORMAL HIGH (ref 26.0–34.0)
MCHC: 32 g/dL (ref 30.0–36.0)
MCV: 107.9 fL — AB (ref 78.0–100.0)
PLATELETS: 31 10*3/uL — AB (ref 150–400)
RBC: 1.91 MIL/uL — ABNORMAL LOW (ref 3.87–5.11)
RDW: 22.7 % — AB (ref 11.5–15.5)
WBC: 5 10*3/uL (ref 4.0–10.5)

## 2015-08-25 LAB — APTT: aPTT: 47 seconds — ABNORMAL HIGH (ref 24–37)

## 2015-08-25 LAB — LACTATE DEHYDROGENASE: LDH: 106 U/L (ref 98–192)

## 2015-08-25 LAB — INTRINSIC FACTOR ANTIBODIES: Intrinsic Factor: 1.1 AU/mL (ref 0.0–1.1)

## 2015-08-25 LAB — GLUCOSE, CAPILLARY: Glucose-Capillary: 82 mg/dL (ref 65–99)

## 2015-08-25 LAB — PROTIME-INR
INR: 2.01 — ABNORMAL HIGH (ref 0.00–1.49)
Prothrombin Time: 22.7 seconds — ABNORMAL HIGH (ref 11.6–15.2)

## 2015-08-25 MED ORDER — PENTAFLUOROPROP-TETRAFLUOROETH EX AERO: 1.0000 "application " | INHALATION_SPRAY | CUTANEOUS | Status: DC | PRN

## 2015-08-25 MED ORDER — ALTEPLASE 2 MG IJ SOLR: 2.0000 mg | Freq: Once | INTRAMUSCULAR | Status: DC | PRN

## 2015-08-25 MED ORDER — HEPARIN SODIUM (PORCINE) 1000 UNIT/ML DIALYSIS: 1000.0000 [IU] | INTRAMUSCULAR | Status: DC | PRN

## 2015-08-25 MED ORDER — PIPERACILLIN-TAZOBACTAM IN DEX 2-0.25 GM/50ML IV SOLN
2.2500 g | Freq: Three times a day (TID) | INTRAVENOUS | Status: DC
  Administered 2015-08-26 – 2015-08-29 (×11): 2.25 g via INTRAVENOUS
  Filled 2015-08-25 (×14): qty 50

## 2015-08-25 MED ORDER — LIDOCAINE-PRILOCAINE 2.5-2.5 % EX CREA: 1.0000 "application " | TOPICAL_CREAM | CUTANEOUS | Status: DC | PRN

## 2015-08-25 MED ORDER — SODIUM CHLORIDE 0.9 % IV SOLN: 100.0000 mL | INTRAVENOUS | Status: DC | PRN

## 2015-08-25 MED ORDER — GABAPENTIN 300 MG PO CAPS: 300.0000 mg | ORAL_CAPSULE | Freq: Every day | ORAL | Status: DC

## 2015-08-25 MED ORDER — LIDOCAINE HCL (PF) 1 % IJ SOLN: 5.0000 mL | INTRAMUSCULAR | Status: DC | PRN

## 2015-08-25 NOTE — Progress Notes (Signed)
Pt refused medications and a oral recheck of her temperature.  Dr. Posey Pronto made aware.

## 2015-08-25 NOTE — Clinical Social Work Placement (Signed)
   CLINICAL SOCIAL WORK PLACEMENT  NOTE  Date:  08/25/2015  [name] Details  Name: [name] [name] MRN: [id number] Date of Birth: 1955/10/02  Clinical Social Work is seeking post-discharge placement for this [name] at the [place] level of care (*CSW will initial, date and re-position this form in  chart as items are completed):  Yes   [name]/family provided with [facility] Work Department's list of facilities offering this level of care within the geographic area requested by the [name] (or if unable, by the [name]'s family).  Yes   [name]/family informed of their freedom to choose among providers that offer the needed level of care, that participate in Medicare, Medicaid or managed care program needed by the [name], have an available bed and are willing to accept the [name].  Yes   [name]/family informed of [facility]'s ownership interest in [facility] and [facility], as well as of the fact that they are under no obligation to receive care at these facilities.  PASRR submitted to EDS on 08/23/15     PASRR number received on 08/23/15     Existing PASRR number confirmed on       FL2 transmitted to all facilities in geographic area requested by pt/family on 08/23/15     FL2 transmitted to all facilities within larger geographic area on       [name] informed that his/her managed care company has contracts with or will negotiate with certain facilities, including the following:            [name]/family informed of bed offers received.  [name] chooses bed at       Physician recommends and [name] chooses bed at      [name] to be transferred to   on  .  [name] to be transferred to facility by       [name] family notified on   of transfer.  Name of family member notified:        PHYSICIAN       Additional Comment:    _______________________________________________ Benard Halsted, [place] 08/25/2015, 8:59 AM

## 2015-08-25 NOTE — Progress Notes (Signed)
OT Cancellation Note  Patient Details Name: [name] [name] MRN: [id number] DOB: 1956/06/04   Cancelled Treatment:    Reason Eval/Treat Not Completed: Patient at procedure or test/ unavailable.  Pt is in HD. Will check back as schedule permits.    Hermione Havlicek 08/25/2015, 11:26 AM   Lesle Chris, OTR/L [phone] 08/25/2015

## 2015-08-25 NOTE — Progress Notes (Addendum)
Subjective: Patient seen during dialysis. Patient reports continued diarrhea with a large bowel movement this morning. She denies any abdominal pain today or episodes of bleeding. She is receiving 2 units PRBCs during dialysis. She reports tremors which may be related to her Neurontin, which Nephrology has discontinued.   Objective: Vital signs in last 24 hours: Filed Vitals:   08/25/15 1000 08/25/15 1014 08/25/15 1026 08/25/15 1039  BP: 131/85 144/87 150/88 156/95  Pulse: 98 93 87 88  Temp: 98.1 F (36.7 C) 98.2 F (36.8 C) 98.1 F (36.7 C) 98.2 F (36.8 C)  TempSrc: Oral Oral Oral Oral  Resp: 18 18 16 15   Height:      Weight:    77 lb 9.6 oz (35.2 kg)  SpO2:  99%     Weight change:   Intake/Output Summary (Last 24 hours) at 08/25/15 1147 Last data filed at 08/25/15 1039  Gross per 24 hour  Intake      0 ml  Output    601 ml  Net   -601 ml   General: cachectic woman, resting in bed during dialysis, somnolent Cardiac: tachycardic, regular rhythm, systolic murmur heard best at LUS border Pulm: clear to auscultation bilaterally Abd: soft, nondistended, nontender, hypoactive bowel sounds Ext: thin extremities, no pedal edema   Assessment/Plan: Active Problems:   Hyperparathyroidism, secondary renal (HCC)   Depression   GERD   Chronic pancreatitis (HCC)   Seizure disorder (HCC)   Severe protein-calorie malnutrition (HCC)   Anemia of chronic disease   Folate deficiency   Lupus (systemic lupus erythematosus) (HCC)   ESRD (end stage renal disease) on dialysis (HCC)   Recurrent Clostridium difficile diarrhea   B12 deficiency   Cirrhosis (HCC)   Anemia due to bone marrow failure (HCC)   Fever and chills   Pancytopenia (HCC)   Thrombocytopenia (HCC)   Persistent fever  Persistent C. Difficile diarrhea: Patient with persistent diarrhea with Tmax of 100.1 yesterday. She is now on Dificid (Fidaxomicin) after failure of oral Vancomycin. She has continued low-grade fever  spikes daily with slow downtrend in fever curve. Patient has been extremely difficult to obtain IV access. As patient is ESRD on HD, upper extremity PICC line is difficult to place with regards to sparing available arm. A temporary IJ central line would need to be placed if emergent IV access was deemed necessary. To complicate matters, patient has severe thrombocytopenia with intermittent epistaxis which would increase bleeding risk with central line placement. Due to these complicating factors, slow downtrend in fever curve, and negative cultures to date, we will not pursue aggressive measures with central line placement and broad-spectrum antibiotics. She has been unstable the last few days with high risk for bleeding and secondary infection. Patient also will need assistance with obtaining Dificid after hospital discharge to complete treatment of her CDifficile infection. She remains a high risk for deterioration without hospital supervision and PT is now recommending SNF due to increased weakness and assistance needs.  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 She has had intermittent episodes of epistaxis. Her lower than baseline thrombocytopenia may be related to the Vancomycin, which we have discontinued. She has also been on immunosuppressants for her lupus which can  cause thrombocytopenia. If patient were to have significant bleeding, may need emergent dialysis for platelet transfusion due to limited vascular access as described above. -Holding Azathioprine  -Monitor for bleeding   ESRD on MWF HD: Hgb 6.6 this morning. No reported bleeding overnight or this morning.  Transfused 2 units PRBCs with HD today. -HD per nephrology -2 units PRBC with today's HD session  SLE -Continue Plaquenil 400 mg daily  -Holding Azathiopine 50 mg daily in setting of severe thrombocytopenia   Chronic pancreatitis -Continue Creon 12,000 units TID  Depression -Continue Prozac 10 mg daily -Continue Mirtazapine 7.5 mg daily    ADDENDUM: Patient with fever spike of 101.4 again today. Have discussed with IR to evaluate for vascular access placement. After access placed, we will begin broad spectrum antibiotics.  Dispo: Disposition is deferred at this time, awaiting improvement of current medical problems.      LOS: 6 days   Zada Finders, MD 08/25/2015, 11:47 AM

## 2015-08-25 NOTE — Progress Notes (Signed)
[name], female   DOB: [birth year], 60 y.o.   MRN: [id number] Medicine attending: Database reviewed and management discussed at the bedside with the resident team. She has no suitable size for vascular access except for an emergency intervention. Platelet count remains low but is showing a trend for improvement today at 31,000. Hemoglobin 6.6 unchanged from yesterday and she will get blood with her dialysis today. Intermittent fevers persist. 101.4 this morning after initial trend downward. She is still having multiple loose bowel movements. Stool counts have not been recorded as far as I can tell from the flowsheets. Source of her fever unclear but remains a concern. At this point I am inclined to have radiology assist with a venous access catheter and begin broad-spectrum antibiotics. Primaxin should cover most gram negatives, gram positives, and anaerobes.

## 2015-08-25 NOTE — Progress Notes (Signed)
Assessment: 1. Recurrent Cdiff- off vanc, on fidaxomicin, WBC/ fevers improving, still diarrhea 2. Severe protein calorie malnutrition  3. ESRD - MWF. HD today no heparin 4. Volume - no vol excess, wts inaccurate 5. SLE- meds on hold 6. Thrombocytopenia - severe. Liver dz/SLE, other. Azathioprine held. 7. Depression-  per primary 8. Alcohol abuse 9. Liver disease - Increased Bili 6.0- w/u per primary 10. Seizure disorder - keppra 11. 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 Low phos, no binder, +vit D 13. Anemia of CKD/ chron disease/nosebleed - Hb 6.6, 2u prbc with HD today  14. [facility] - complication of neurontin in ESRD, will dc   Plan - HD today, dc neurontin   [name] Splinter MD [facility] pager [phone]    cell [phone] 08/25/2015, 9:55 AM    [facility] MWF  4h  4k/2.25 bath  34.5kg  LUA AVG  Hep 2000  Qb 350   Mircera 225 last 2/15 Calcitriol 1.25 ug Hb 8.9  pth 298   Subjective: Had nosebleed and Hb 6.6 this am.  NO gross blood in stools.  PRBC's ordered for tomorrow  Objective: Vital signs in last 24 hours: Temp:  [97.5 F (36.4 C)-100.1 F (37.8 C)] 97.9 F (36.6 C) (03/22 0939) Pulse Rate:  [94-105] 97 (03/22 0939) Resp:  [16-20] 20 (03/22 0939) BP: (96-119)/(59-79) 119/79 mmHg (03/22 0939) SpO2:  [98 %-100 %] 98 % (03/22 0923) Weight:  [40 kg (88 lb 2.9 oz)] 40 kg (88 lb 2.9 oz) (03/22 0705) Weight change:   Intake/Output from previous day:   Intake/Output this shift:   Exam Awake, lethargic, slurred speech, very weak No jvd Chest clear bilat RRR no mrg Abd firm, +bs nontender MS no LE edema LUA AVG +bruit Neuro ox 3, nf  Lab Results:  Recent Labs  08/24/15 0536 08/25/15 0610  WBC 3.7* 5.0  HGB 6.6* 6.6*  HCT 20.0* 20.6*  PLT 16* PENDING   BMET:   Recent Labs  08/24/15 0536 08/25/15 0610  NA 137 135  K 4.3 4.8  CL 101 101  CO2 25 26  GLUCOSE 110* 69  BUN 5* 8  CREATININE 2.99*  3.97*  CALCIUM 8.0* 8.0*   No results for input(s): PTH in the last 72 hours. Iron Studies: No results for input(s): IRON, TIBC, TRANSFERRIN, FERRITIN in the last 72 hours. Studies/Results: Dg Chest 2 View  08/23/2015  CLINICAL DATA:  Fever, chills, diarrhea EXAM: CHEST  2 VIEW COMPARISON:  08/05/2015 FINDINGS: Lungs are clear.  No pleural effusion or pneumothorax. Cardiomegaly. Visualized osseous structures are within normal limits. IMPRESSION: No evidence of acute cardiopulmonary disease. Electronically Signed   By: Julian Hy M.D.   On: 08/23/2015 14:31    Scheduled: . sodium chloride   Intravenous Once  . calcitRIOL  1.25 mcg Oral Q M,W,F-HD  . darbepoetin (ARANESP) injection - DIALYSIS  200 mcg Intravenous Q Fri-HD  . feeding supplement (ENSURE ENLIVE)  237 mL Oral TID WC  . fidaxomicin  200 mg Oral BID  . FLUoxetine  10 mg Oral Daily  . folic acid  1 mg Oral Daily  . gabapentin  300 mg Oral QHS  . hydroxychloroquine  400 mg Oral Daily  . lactobacillus  1 g Oral TID  . levETIRAcetam  250 mg Oral Daily  . levETIRAcetam  500 mg Oral Q M,W,F-1800  . lipase/protease/amylase  12,000 Units Oral TID AC  . mirtazapine  7.5 mg Oral QHS  .  multivitamin with minerals  1 tablet Oral Daily  . pantoprazole  40 mg Oral Daily  . thiamine  100 mg Oral Daily    LOS: 6 days   [name] [name] D 08/25/2015,9:55 AM

## 2015-08-26 ENCOUNTER — Inpatient Hospital Stay (HOSPITAL_COMMUNITY): Payer: Medicare Other

## 2015-08-26 LAB — BASIC METABOLIC PANEL
ANION GAP: 9 (ref 5–15)
BUN: <5 mg/dL — ABNORMAL LOW (ref 6–20)
CALCIUM: 7.8 mg/dL — AB (ref 8.9–10.3)
CO2: 27 mmol/L (ref 22–32)
Chloride: 99 mmol/L — ABNORMAL LOW (ref 101–111)
Creatinine, Ser: 2.15 mg/dL — ABNORMAL HIGH (ref 0.44–1.00)
GFR, EST AFRICAN AMERICAN: 28 mL/min — AB (ref >60)
GFR, EST NON AFRICAN AMERICAN: 24 mL/min — AB (ref >60)
GLUCOSE: 62 mg/dL — AB (ref 65–99)
POTASSIUM: 3.6 mmol/L (ref 3.5–5.1)
SODIUM: 135 mmol/L (ref 135–145)

## 2015-08-26 LAB — CBC
HCT: 34.3 % — ABNORMAL LOW (ref 36.0–46.0)
Hemoglobin: 11.6 g/dL — ABNORMAL LOW (ref 12.0–15.0)
MCH: 31.9 pg (ref 26.0–34.0)
MCHC: 33.8 g/dL (ref 30.0–36.0)
MCV: 94.2 fL (ref 78.0–100.0)
PLATELETS: 21 10*3/uL — AB (ref 150–400)
RBC: 3.64 MIL/uL — AB (ref 3.87–5.11)
RDW: 22.8 % — AB (ref 11.5–15.5)
WBC: 4.1 10*3/uL (ref 4.0–10.5)

## 2015-08-26 LAB — TYPE AND SCREEN
ABO/RH(D): A POS
Antibody Screen: NEGATIVE
Unit division: 0
Unit division: 0

## 2015-08-26 LAB — VITAMIN B12: VITAMIN B 12: 2868 pg/mL — AB (ref 180–914)

## 2015-08-26 LAB — FOLATE: FOLATE: 16.8 ng/mL (ref >5.9)

## 2015-08-26 MED ORDER — RENA-VITE PO TABS
1.0000 | ORAL_TABLET | Freq: Every day | ORAL | Status: DC
  Administered 2015-08-26 – 2015-09-01 (×7): 1 via ORAL
  Filled 2015-08-26 (×7): qty 1

## 2015-08-26 MED ORDER — LIDOCAINE HCL 1 % IJ SOLN
INTRAMUSCULAR | Status: AC
  Filled 2015-08-26: qty 20

## 2015-08-26 MED ORDER — NYSTATIN 100000 UNIT/ML MT SUSP
5.0000 mL | Freq: Four times a day (QID) | OROMUCOSAL | Status: AC
  Administered 2015-08-26 – 2015-09-01 (×22): 500000 [IU] via ORAL
  Filled 2015-08-26 (×22): qty 5

## 2015-08-26 NOTE — Progress Notes (Signed)
Physical Therapy Treatment Patient Details Name: [name] [name] MRN: [id number] DOB: 01/06/1956 Today's Date: 08/26/2015    History of Present Illness 60 y.o. female admitted for weakness, nausea, and chronic diarrhea. 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     PT Comments    Pt progressing with mobility, she ambulated 200' with RW today, without loss of balance.   Follow Up Recommendations  SNF     Equipment Recommendations  Rolling walker with 5" wheels    Recommendations for Other Services       Precautions / Restrictions Precautions Precautions: Fall Precaution Comments: pt denies h/o falls Restrictions Weight Bearing Restrictions: No    Mobility  Bed Mobility Overal bed mobility: Modified Independent Bed Mobility: Supine to Sit;Sit to Supine     Supine to sit: Modified independent (Device/Increase time);HOB elevated Sit to supine: Modified independent (Device/Increase time);HOB elevated   General bed mobility comments: Increased time for tasks, use of rails, HOB up 40*  Transfers Overall transfer level: Needs assistance Equipment used: Rolling walker (2 wheeled) Transfers: Sit to/from Stand Sit to Stand: Modified independent (Device/Increase time) Stand pivot transfers: Min guard       General transfer comment: VC's for RW safety and sequencing  Ambulation/Gait Ambulation/Gait assistance: Supervision Ambulation Distance (Feet): 200 Feet Assistive device: Rolling walker (2 wheeled) Gait Pattern/deviations: Narrow base of support;Decreased stride length   Gait velocity interpretation: at or above normal speed for age/gender General Gait Details: no LOB, steady with RW   Stairs            Wheelchair Mobility    Modified  Rankin (Stroke Patients Only)       Balance Overall balance assessment: Needs assistance Sitting-balance support: No upper extremity supported;Feet supported Sitting balance-Leahy Scale: Good     Standing balance support: Bilateral upper extremity supported;No upper extremity supported;During functional activity Standing balance-Leahy Scale: Fair                      Cognition Arousal/Alertness: Awake/alert Behavior During Therapy: Flat affect Overall Cognitive Status: Within Functional Limits for tasks assessed                      Exercises      General Comments        Pertinent Vitals/Pain Pain Assessment: No/denies pain Pain Score: 5  Pain Location: abdomen Pain Descriptors / Indicators: Sore Pain Intervention(s): Monitored during session;Limited activity within patient's tolerance;Patient requesting pain meds-RN notified    Home Living                      Prior Function            PT Goals (current goals can now be found in the care plan section) Acute Rehab PT Goals Patient Stated Goal: to get better PT Goal Formulation: With patient Time For Goal Achievement: 09/05/15 Potential to Achieve Goals: Good Progress towards PT goals: Progressing toward goals    Frequency  Min 3X/week    PT Plan Current plan remains appropriate    Co-evaluation             End of Session Equipment Utilized During Treatment: Gait belt Activity Tolerance: Patient tolerated treatment well Patient left: in bed;with call bell/phone within reach;with bed alarm set  Time: [id number] PT Time Calculation (min) (ACUTE ONLY): 14 min  Charges:  $Gait Training: 8-22 mins                    G Codes:      Philomena Doheny 08/26/2015, 1:41 PM [phone]

## 2015-08-26 NOTE — Progress Notes (Signed)
Occupational Therapy Treatment Patient Details Name: SABY SIMA MRN: [id number] DOB: 13-Feb-1956 Today's Date: 08/26/2015    History of present illness 60 y.o. female admitted for weakness, nausea, and chronic diarrhea. 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    OT comments  Pt seen for ADL retraining session today with focus on functional mobility/transfers during basic ADL's. Pt cont to be limited by frequent loose stools x2 during this session. Balance is improved with use of RW during transfers, grooming and toileting w/ vc's for safety and sequencing.   Follow Up Recommendations  No OT follow up;Supervision/Assistance - 24 hour    Equipment Recommendations  Other (comment) (RW 2 wheeled)    Recommendations for Other Services      Precautions / Restrictions Precautions Precautions: Fall Restrictions Weight Bearing Restrictions: No       Mobility Bed Mobility Overal bed mobility: Needs Assistance Bed Mobility: Supine to Sit;Sit to Supine     Supine to sit: Modified independent (Device/Increase time);HOB elevated Sit to supine: Modified independent (Device/Increase time)   General bed mobility comments: Increased time for tasks, use of rails I'ly  Transfers Overall transfer level: Needs assistance Equipment used: Rolling walker (2 wheeled) Transfers: Sit to/from [facility] Sit to Stand: Supervision Stand pivot transfers: Min guard       General transfer comment: VC's for RW safety and sequencing    Balance Overall balance assessment: Needs assistance Sitting-balance support: No upper extremity supported;Feet supported Sitting balance-Leahy Scale: Good     Standing balance support: Bilateral upper extremity supported;No  upper extremity supported;During functional activity Standing balance-Leahy Scale: Fair                     ADL Overall ADL's : Needs assistance/impaired     Grooming: Wash/dry hands;Supervision/safety;Standing Grooming Details (indicate cue type and reason): Standing at sink after toileting         Upper Body Dressing : Sitting;Supervision/safety;Set up   Lower Body Dressing: Sit to/from stand;Supervision/safety;Min guard Lower Body Dressing Details (indicate cue type and reason): Assist to don depends undergarment Toilet Transfer: Supervision/safety;RW;Ambulation   Toileting- Clothing Manipulation and Hygiene: Sit to/from stand;Supervision/safety;Minimal assistance Toileting - Clothing Manipulation Details (indicate cue type and reason):  Assist with pericare due to large hemorrhoid and pt with loose stool in bed. After assisted with clean up, pt was agreeable to functional mobility in room and ambulated into bathroom, onto toilet and had another large loose stool.  Pt  don/doffs underwear with supervision     Functional mobility during ADLs: Supervision/safety;Cueing for safety;Rolling walker General ADL Comments: Pt was agreeable to acute OT after encourangement for participation but stating that she did not need to go to the bathroom, as OT was getting gripper socks for pt, she had BM unaware in bed "Uh, I think i just went". Pt was Min A pericare and changing of gown, linens etc. She applied  depends sitting EOB with Min A and ambulated with RW to bathroom and onto toilet and had another loose stool. Pt washed her hands standing at the sink and required vc's for RW placement for safety. Pt declined any further ADL's &/or sitting up in chair requesting to get back into bed. "I'll get a bath later & I'm not sitting in that chair, maybe later".  Vision  Glasses for reading. No change from baseline level.                   Perception     Praxis       Cognition   Behavior During Therapy: Flat affect Overall Cognitive Status: Within Functional Limits for tasks assessed                       Extremity/Trunk Assessment               Exercises     Shoulder Instructions       General Comments      Pertinent Vitals/ Pain       Pain Assessment: No/denies pain  Home Living                                          Prior Functioning/Environment              Frequency Min 2X/week     Progress Toward Goals  OT Goals(current goals can now be found in the care plan section)  Progress towards OT goals: Progressing toward goals     Plan Discharge plan remains appropriate    Co-evaluation                 End of Session Equipment Utilized During Treatment: Gait belt;Rolling walker   Activity Tolerance Patient tolerated treatment well   Patient Left in bed;with call bell/phone within reach;with bed alarm set;Other (comment) (W/ MD in room)   Nurse Communication Other (comment) (2 loose stools during OT session and declined sitting in chair)        Time: [id number] OT Time Calculation (min): 31 min  Charges: OT General Charges $OT Visit: 1 Procedure OT Treatments $Self Care/Home Management : 23-37 mins  Barnhill, Amy Beth Dixon, OTR/L 08/26/2015, 10:44 AM

## 2015-08-26 NOTE — Procedures (Signed)
Placement of right jugular central line.  Tip at SVC/RA junction.  No immediate complication and minimal blood loss.

## 2015-08-26 NOTE — Progress Notes (Signed)
RN went into pt room to give morning medication. Pt refused and would only take certain ones. After RN administered those pt changed her mind and told RN to give the rest to her. RN did as pt requested and administered the rest of morning medication. [name] [name] 08/26/2015 10:02 AM

## 2015-08-26 NOTE — Progress Notes (Signed)
Subjective: Patient seen at bedside during rounds this morning. IV access was obtained this morning to administer Zosyn. She reports about 4 episodes of watery semi-formed stool overnight. She feels like she is slowly improving. She received 2 units PRBCs yesterday with dialysis and states that she feels a little bit stronger today. She denies any abdominal pain.   Objective: Vital signs in last 24 hours: Filed Vitals:   08/25/15 1625 08/25/15 1839 08/25/15 2109 08/26/15 0625  BP:   108/70 117/76  Pulse:   93 81  Temp: 100.9 F (38.3 C) 98.6 F (37 C) 97.6 F (36.4 C) 97.7 F (36.5 C)  TempSrc: Oral Axillary Oral Oral  Resp:   18 20  Height:      Weight:    83 lb 15.9 oz (38.1 kg)  SpO2:    100%   Weight change:   Intake/Output Summary (Last 24 hours) at 08/26/15 1046 Last data filed at 08/26/15 [id number]  Gross per 24 hour  Intake    240 ml  Output    100 ml  Net    140 ml   General: cachectic woman, resting in bed, no acute distress Cardiac: RRR, systolic murmur heard best at LUS border Pulm: clear to auscultation bilaterally Abd: soft, nondistended, nontender, hypoactive bowel sounds Skin: superficial non-draining sacral ulcer about 1x1cm Ext: thin extremities, no pedal edema   Assessment/Plan: Active Problems:   Hyperparathyroidism, secondary renal (HCC)   Depression   GERD   Chronic pancreatitis (HCC)   Seizure disorder (HCC)   Severe protein-calorie malnutrition (HCC)   Anemia of chronic disease   Folate deficiency   Lupus (systemic lupus erythematosus) (HCC)   ESRD (end stage renal disease) on dialysis (HCC)   Recurrent Clostridium difficile diarrhea   B12 deficiency   Cirrhosis ([place])   Anemia due to bone marrow failure (HCC)   Fever and chills   Pancytopenia (HCC)   Thrombocytopenia (HCC)   Persistent fever  Persistent C. Difficile diarrhea: Patient with persistent diarrhea with Tmax of 101.4 yesterday. She reports 4 loose stools overnight. Nursing  report has recorded one bowel movement today. She is now on Dificid (Fidaxomicin) after failure of oral Vancomycin. She has continued low-grade fever spikes daily. Patient has been extremely difficult to obtain IV access, but his was obtained this morning to administer IV Zosyn empirically for possible secondary infection. She remains a high risk for deterioration without hospital supervision and PT is now recommending SNF due to increased weakness and assistance needs.  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 She has had intermittent episodes of epistaxis, none today. Her lower than baseline thrombocytopenia may be related to the Vancomycin, which we have discontinued. She has also been on immunosuppressants for her lupus which can cause thrombocytopenia. -Holding Azathioprine  -Monitor for bleeding   ESRD on MWF HD: Hgb 11.6 this morning after transfused 2 units PRBCs with HD yesterday. -HD per nephrology   SLE -Continue Plaquenil 400 mg daily  -Holding Azathiopine 50 mg daily in setting of severe thrombocytopenia -f/u dsDNA   Chronic pancreatitis -Continue Creon 12,000 units TID  Depression -Continue Prozac 10 mg daily -Continue Mirtazapine 7.5 mg daily  Dispo: Disposition is deferred at this time, awaiting improvement of current medical problems.      LOS: 7 days   Zada Finders, MD 08/26/2015, 10:46 AM

## 2015-08-26 NOTE — Progress Notes (Signed)
PT Cancellation Note  Patient Details Name: [name] [name] MRN: [id number] DOB: 1955-10-13   Cancelled Treatment:    Reason Eval/Treat Not Completed: Fatigue/lethargy limiting ability to participate (pt stated she's tired from working with OT recently. Will follow. )   Philomena Doheny 08/26/2015, 11:16 AM  [phone]

## 2015-08-26 NOTE — Progress Notes (Signed)
CRITICAL VALUE ALERT  Critical value received:  Platelet 21   Date of notification:  08/26/2015  Time of notification: 0939  Critical value read back: yes  Nurse who received alert: Dorita Fray  MD notified (1st page):  MD Posey Pronto  Time of first page:  [phone]  Responding MD: MD Posey Pronto  Time MD responded:  [phone]

## 2015-08-26 NOTE — Progress Notes (Signed)
[name], female   DOB: 06-06-1955, 60 y.o.   MRN: [id number] Medicine attending: I examined this patient this morning together with resident physician Dr. Zada Finders and I concur with his evaluation and management plan which we discussed together. IV team was able to get a line established using a vein on her right hand. We started her on an empiric course of Zosyn. She is still having unexplained fever. No obvious source on exam. Negative chest x-ray. She does have a white exudate on the dorsum of her tongue, possible Candida. No exudate in the posterior pharynx. Sacral ulcer inspected and is very superficial. Vascular access site for dialysis stable, nontender, and dialysis staff report no difficulty using her fistula for dialysis treatments. She is having semi-formed stools. 600 mL's of output recorded yesterday from stool. She made one 100 mL's of urine. Urinalysis today with 6-30 white cells. Negative nitrite. 6-30 RBCs. 1+ protein. Culture pending. Exaggerated rise in hemoglobin after 2 unit transfusion yesterday up from 6.6 to11.6. Platelet count down from 31,000 yesterday to 21,000 and white count down from 5000 to 4100 after initial trend for improvement. Overall, she has shown little progress. We will continue empiric Zosyn. Begin oral nystatin. She remains unstable for discharge with persistent fever, diarrhea, and thrombocytopenia.

## 2015-08-26 NOTE — Progress Notes (Signed)
Assessment: 1. Recurrent Cdiff- off vanc, on fidaxomicin, WBC/ fevers improving, still diarrhea 2. Severe protein calorie malnutrition  3. ESRD - MWF. HD tomorrow no heparin 4. Volume - up 3-4 kg 5. SLE- meds on hold 6. Thrombocytopenia - severe. Liver dz/SLE, other. Azathioprine held. Plts 20-30 k 7. Depression-  per primary 8. Seizures - keppra 9. Liver disease - Increased Bili 6.0- w/u per primary 10. Seizure disorder - keppra 11. 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 Low phos, no binder, +vit D 13. Anemia of CKD/ chron disease/nosebleed - Hb up > 11, hold esa for now 14. Asterixis - complication of neurontin in ESRD, have dc'd 15. BP - not on BP medication   Plan - HD tomorrow, UF 3 kg as Ronnie Derby MD [employer] pager [phone]    cell [phone] 08/26/2015, 10:32 AM    [facility] MWF  4h  4k/2.25 bath  34.5kg  LUA AVG  Hep 2000  Qb 350   Mircera 225 last 2/15 Calcitriol 1.25 ug Hb 8.9  pth 298   Subjective: Had nosebleed and Hb 6.6 this am.  NO gross blood in stools.  PRBC's ordered for tomorrow  Objective: Vital signs in last 24 hours: Temp:  [97.6 F (36.4 C)-101.4 F (38.6 C)] 97.7 F (36.5 C) (03/23 0625) Pulse Rate:  [81-105] 81 (03/23 0625) Resp:  [15-20] 20 (03/23 0625) BP: (108-156)/(70-95) 117/76 mmHg (03/23 0625) SpO2:  [98 %-100 %] 100 % (03/23 0625) Weight:  [35.2 kg (77 lb 9.6 oz)-38.1 kg (83 lb 15.9 oz)] 38.1 kg (83 lb 15.9 oz) (03/23 0625) Weight change:   Intake/Output from previous day: 03/22 0701 - 03/23 0700 In: 240 [P.O.:240] Out: 701 [Urine:100] Intake/Output this shift:   Exam Awake, lethargic, slurred speech, very weak No jvd Chest clear bilat RRR no mrg Abd firm, +bs nontender MS no LE edema LUA AVG +bruit Neuro ox 3, nf  Lab Results:  Recent Labs  08/25/15 0610 08/26/15 0602  WBC 5.0 4.1  HGB 6.6* 11.6*  HCT 20.6* 34.3*  PLT 31* 21*   BMET:   Recent Labs  08/25/15 0610 08/26/15 0602  NA 135 135  K 4.8 3.6  CL 101 99*  CO2 26 27  GLUCOSE 69 62*  BUN 8 <5*  CREATININE 3.97* 2.15*  CALCIUM 8.0* 7.8*   No results for input(s): PTH in the last 72 hours. Iron Studies: No results for input(s): IRON, TIBC, TRANSFERRIN, FERRITIN in the last 72 hours. Studies/Results: No results found.  Scheduled: . sodium chloride   Intravenous Once  . calcitRIOL  1.25 mcg Oral Q M,W,F-HD  . darbepoetin (ARANESP) injection - DIALYSIS  200 mcg Intravenous Q Fri-HD  . feeding supplement (ENSURE ENLIVE)  237 mL Oral TID WC  . fidaxomicin  200 mg Oral BID  . FLUoxetine  10 mg Oral Daily  . folic acid  1 mg Oral Daily  . hydroxychloroquine  400 mg Oral Daily  . lactobacillus  1 g Oral TID  . levETIRAcetam  250 mg Oral Daily  . levETIRAcetam  500 mg Oral Q M,W,F-1800  . lipase/protease/amylase  12,000 Units Oral TID AC  . mirtazapine  7.5 mg Oral QHS  . multivitamin with minerals  1 tablet Oral Daily  . pantoprazole  40 mg Oral Daily  . piperacillin-tazobactam (ZOSYN)  IV  2.25 g Intravenous Q8H  . thiamine  100 mg Oral Daily    LOS: 7  days   Tequilla Cousineau D 08/26/2015,10:32 AM

## 2015-08-26 NOTE — Progress Notes (Signed)
Pt did not received 2000 iv zosyn due to lack of IV access, iv team made an attempt but pt keep being unstable and vein difficulty to access, iv team came again this morning and was able to obtain iv access 0400 am zosyn administered, pharmacy was informed about ZOSYN skip dose

## 2015-08-26 NOTE — Care Management Important Message (Signed)
Important Message  Patient Details  Name: [name] [name] MRN: [id number] Date of Birth: 1955-06-28   Medicare Important Message Given:  Yes    [name], Antony Haste, RN 08/26/2015, 12:12 PM

## 2015-08-26 NOTE — Plan of Care (Signed)
Problem: Activity: Goal: Risk for activity intolerance will decrease Outcome: Not Progressing Pt refuse to sit up in bed or sit in chair when RN offered to help pt transfer.

## 2015-08-26 NOTE — Care Management Note (Signed)
Case Management Note  Patient Details  Name: [name] [name] MRN: [id number] Date of Birth: 1955/07/09  Subjective/Objective: 60 y.o F admitted 08/19/2015   weakness, nausea, and chronic diarrhea. 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                  Action/Plan:   Anticipate discharge to SNF when medically ready. No further CM needs but will be available should additional discharge needs arise.   Expected Discharge Date:                  Expected Discharge Plan:  Skilled Nursing Facility  In-House Referral:  Clinical Social Work  Discharge planning Services  CM Consult  Post Acute Care Choice:    Choice offered to:     DME Arranged:    DME Agency:     HH Arranged:    [place] Agency:     Status of Service:  Completed, signed off  Medicare Important Message Given:  Yes Date Medicare IM Given:    Medicare IM give by:    Date Additional Medicare IM Given:    Additional Medicare Important Message give by:     If discussed at [place] of Stay Meetings, dates discussed:    Additional Comments:  Delrae Sawyers, RN 08/26/2015, 2:54 PM

## 2015-08-27 DIAGNOSIS — B370 Candidal stomatitis: Secondary | ICD-10-CM

## 2015-08-27 DIAGNOSIS — K7030 Alcoholic cirrhosis of liver without ascites: Secondary | ICD-10-CM | POA: Insufficient documentation

## 2015-08-27 LAB — GLUCOSE, CAPILLARY
Glucose-Capillary: 41 mg/dL — CL (ref 65–99)
Glucose-Capillary: 78 mg/dL (ref 65–99)

## 2015-08-27 LAB — BASIC METABOLIC PANEL
ANION GAP: 8 (ref 5–15)
BUN: 5 mg/dL — ABNORMAL LOW (ref 6–20)
CALCIUM: 7.9 mg/dL — AB (ref 8.9–10.3)
CO2: 27 mmol/L (ref 22–32)
CREATININE: 3.39 mg/dL — AB (ref 0.44–1.00)
Chloride: 99 mmol/L — ABNORMAL LOW (ref 101–111)
GFR, EST AFRICAN AMERICAN: 16 mL/min — AB (ref >60)
GFR, EST NON AFRICAN AMERICAN: 14 mL/min — AB (ref >60)
GLUCOSE: 61 mg/dL — AB (ref 65–99)
Potassium: 3.6 mmol/L (ref 3.5–5.1)
Sodium: 134 mmol/L — ABNORMAL LOW (ref 135–145)

## 2015-08-27 LAB — CBC
HCT: 32.4 % — ABNORMAL LOW (ref 36.0–46.0)
Hemoglobin: 10.7 g/dL — ABNORMAL LOW (ref 12.0–15.0)
MCH: 31.8 pg (ref 26.0–34.0)
MCHC: 33 g/dL (ref 30.0–36.0)
MCV: 96.1 fL (ref 78.0–100.0)
PLATELETS: 30 10*3/uL — AB (ref 150–400)
RBC: 3.37 MIL/uL — ABNORMAL LOW (ref 3.87–5.11)
RDW: 21.5 % — AB (ref 11.5–15.5)
WBC: 4.6 10*3/uL (ref 4.0–10.5)

## 2015-08-27 LAB — ANTI-DNA ANTIBODY, DOUBLE-STRANDED: DS DNA AB: 6 [IU]/mL (ref 0–9)

## 2015-08-27 MED ORDER — LIDOCAINE HCL (PF) 1 % IJ SOLN
5.0000 mL | INTRAMUSCULAR | Status: DC | PRN
  Filled 2015-08-27: qty 5

## 2015-08-27 MED ORDER — DEXTROSE 50 % IV SOLN
INTRAVENOUS | Status: AC
  Administered 2015-08-27: 50 mL via INTRAVENOUS
  Filled 2015-08-27: qty 50

## 2015-08-27 MED ORDER — SODIUM CHLORIDE 0.9 % IV SOLN: 100.0000 mL | INTRAVENOUS | Status: DC | PRN

## 2015-08-27 MED ORDER — LIDOCAINE-PRILOCAINE 2.5-2.5 % EX CREA
1.0000 "application " | TOPICAL_CREAM | CUTANEOUS | Status: DC | PRN
  Filled 2015-08-27: qty 5

## 2015-08-27 MED ORDER — HEPARIN SODIUM (PORCINE) 1000 UNIT/ML DIALYSIS
1000.0000 [IU] | INTRAMUSCULAR | Status: DC | PRN
  Filled 2015-08-27: qty 1

## 2015-08-27 MED ORDER — SODIUM CHLORIDE 0.9% FLUSH
10.0000 mL | INTRAVENOUS | Status: DC | PRN
  Administered 2015-08-30 – 2015-09-01 (×4): 10 mL
  Filled 2015-08-27 (×4): qty 40

## 2015-08-27 MED ORDER — SODIUM CHLORIDE 0.9% FLUSH
10.0000 mL | Freq: Two times a day (BID) | INTRAVENOUS | Status: DC
  Administered 2015-08-28 – 2015-08-29 (×2): 10 mL

## 2015-08-27 MED ORDER — PENTAFLUOROPROP-TETRAFLUOROETH EX AERO: 1.0000 "application " | INHALATION_SPRAY | CUTANEOUS | Status: DC | PRN

## 2015-08-27 MED ORDER — DEXTROSE 50 % IV SOLN
50.0000 mL | Freq: Once | INTRAVENOUS | Status: AC
Start: 2015-08-27 — End: 2015-08-27
  Administered 2015-08-27: 50 mL via INTRAVENOUS

## 2015-08-27 MED ORDER — ALTEPLASE 2 MG IJ SOLR: 2.0000 mg | Freq: Once | INTRAMUSCULAR | Status: DC | PRN

## 2015-08-27 NOTE — Progress Notes (Signed)
Assessment: 1. Recurrent Cdiff- off vanc, on fidaxomicin, diarrhea starting to improve 2. Severe protein calorie malnutrition  3. ESRD - MWF HD , no heparin 4. Volume - at dry wt 5. SLE- meds on hold 6. Thrombocytopenia - severe. Azathioprine held. Plts 20-30 k 7. Depression-  per primary 8. Seizures - keppra 9. Liver disease - Increased Bili 6.0- w/u per primary 10. Hx pancreatitis on Creon 11. 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 Low phos, no binder, +vit D 12. Anemia of CKD/ chron disease/nosebleed - Hb up > 10.7, hold esa for now 13. Asterixis - due to neurontin toxicity, resolved 14. BP - no BP meds 15. Fever - day #2-3 IV zosyn   Plan - HD today, UF 3kg   Kelly Splinter MD [facility] pager [phone]    cell [phone] 08/27/2015, 2:12 PM    [facility] MWF  4h  4k/2.25 bath  34.5kg  LUA AVG  Hep 2000  Qb 350   Mircera 225 last 2/15 Calcitriol 1.25 ug Hb 8.9  pth 298   Subjective: Hb stable, no further nosebleed  Objective: Vital signs in last 24 hours: Temp:  [97.5 F (36.4 C)-101.7 F (38.7 C)] 101.7 F (38.7 C) (03/24 1322) Pulse Rate:  [62-125] 103 (03/24 1322) Resp:  [12-22] 18 (03/24 1322) BP: (92-151)/(52-124) 94/65 mmHg (03/24 1322) SpO2:  [97 %-100 %] 97 % (03/24 1322) Weight:  [32.8 kg (72 lb 5 oz)-35 kg (77 lb 2.6 oz)] 32.8 kg (72 lb 5 oz) (03/24 1045) Weight change: -0.7 kg (-1 lb 8.7 oz)  Intake/Output from previous day: 03/23 0701 - 03/24 0700 In: 120 [P.O.:120] Out: -  Intake/Output this shift: Total I/O In: 240 [P.O.:240] Out: 1708 [Other:1708] Exam Awake, lethargic, slurred speech, very weak No jvd Chest clear bilat RRR no mrg Abd firm, +bs nontender MS no LE edema LUA AVG +bruit Neuro ox 3, nf  Lab Results:  Recent Labs  08/26/15 0602 08/27/15 0741  WBC 4.1 4.6  HGB 11.6* 10.7*  HCT 34.3* 32.4*  PLT 21* 30*   BMET:   Recent Labs  08/26/15 0602 08/27/15 0741  NA 135 134*  K 3.6 3.6  CL 99* 99*   CO2 27 27  GLUCOSE 62* 61*  BUN <5* 5*  CREATININE 2.15* 3.39*  CALCIUM 7.8* 7.9*   No results for input(s): PTH in the last 72 hours. Iron Studies: No results for input(s): IRON, TIBC, TRANSFERRIN, FERRITIN in the last 72 hours. Studies/Results: Ir Fluoro Guide Cv Line Right  08/26/2015  INDICATION: 60 year old with persistent fever and diarrhea. EXAM: FLUOROSCOPIC AND ULTRASOUND GUIDED PLACEMENT OF A CENTRAL VENOUS CATHETER Physician: Stephan Minister. Henn, MD FLUOROSCOPY TIME:  24 seconds, 1 mGy MEDICATIONS: None ANESTHESIA/SEDATION: None PROCEDURE: Informed consent was obtained for placement of a central venous catheter. The patient was placed supine on the interventional table. Ultrasound confirmed a patent right internal jugularvein. Ultrasound images were obtained for documentation. The right side of the neck was prepped and draped in a sterile fashion. The right side of the neck was anesthetized with 1% lidocaine. Maximal barrier sterile technique was utilized including caps, mask, sterile gowns, sterile gloves, sterile drape, hand hygiene and skin antiseptic. A small incision was made with #11 blade scalpel. A 21 gauge needle directed into the right internal jugular vein with ultrasound guidance. A wire was advanced into the central venous system and a peel-away sheath was placed. A dual-lumen Power PICC line was cut to 13 cm. PICC line  was advanced through the peel-away sheath and positioned at the superior cavoatrial junction. Both lumens aspirated and flushed well. Catheter was sutured to the neck. A dressing was placed. Fluoroscopic and ultrasound images were taken and saved for documentation. FINDINGS: Catheter tip at the superior cavoatrial junction. COMPLICATIONS: None IMPRESSION: Successful placement of a central venous catheter using ultrasound and fluoroscopic guidance. Electronically Signed   By: Markus Daft M.D.   On: 08/26/2015 18:03   Ir US Guide Vasc Access Right  08/26/2015  INDICATION:  60 year old with persistent fever and diarrhea. EXAM: FLUOROSCOPIC AND ULTRASOUND GUIDED PLACEMENT OF A CENTRAL VENOUS CATHETER Physician: Stephan Minister. Henn, MD FLUOROSCOPY TIME:  24 seconds, 1 mGy MEDICATIONS: None ANESTHESIA/SEDATION: None PROCEDURE: Informed consent was obtained for placement of a central venous catheter. The patient was placed supine on the interventional table. Ultrasound confirmed a patent right internal jugularvein. Ultrasound images were obtained for documentation. The right side of the neck was prepped and draped in a sterile fashion. The right side of the neck was anesthetized with 1% lidocaine. Maximal barrier sterile technique was utilized including caps, mask, sterile gowns, sterile gloves, sterile drape, hand hygiene and skin antiseptic. A small incision was made with #11 blade scalpel. A 21 gauge needle directed into the right internal jugular vein with ultrasound guidance. A wire was advanced into the central venous system and a peel-away sheath was placed. A dual-lumen Power PICC line was cut to 13 cm. PICC line was advanced through the peel-away sheath and positioned at the superior cavoatrial junction. Both lumens aspirated and flushed well. Catheter was sutured to the neck. A dressing was placed. Fluoroscopic and ultrasound images were taken and saved for documentation. FINDINGS: Catheter tip at the superior cavoatrial junction. COMPLICATIONS: None IMPRESSION: Successful placement of a central venous catheter using ultrasound and fluoroscopic guidance. Electronically Signed   By: Markus Daft M.D.   On: 08/26/2015 18:03    Scheduled: . sodium chloride   Intravenous Once  . calcitRIOL  1.25 mcg Oral Q M,W,F-HD  . feeding supplement (ENSURE ENLIVE)  237 mL Oral TID WC  . fidaxomicin  200 mg Oral BID  . FLUoxetine  10 mg Oral Daily  . folic acid  1 mg Oral Daily  . hydroxychloroquine  400 mg Oral Daily  . lactobacillus  1 g Oral TID  . levETIRAcetam  250 mg Oral Daily  .  levETIRAcetam  500 mg Oral Q M,W,F-1800  . lipase/protease/amylase  12,000 Units Oral TID AC  . mirtazapine  7.5 mg Oral QHS  . multivitamin  1 tablet Oral QHS  . nystatin  5 mL Oral QID  . pantoprazole  40 mg Oral Daily  . piperacillin-tazobactam (ZOSYN)  IV  2.25 g Intravenous Q8H  . thiamine  100 mg Oral Daily    LOS: 8 days   Zafirah Vanzee D 08/27/2015,2:12 PM

## 2015-08-27 NOTE — Progress Notes (Signed)
Subjective: Patient seen in dialysis. She says she is starting to feel a little better but has continued loose stools. 5 stools are charted for yesterday, 1 today.   Objective: Vital signs in last 24 hours: Filed Vitals:   08/27/15 1021 08/27/15 1030 08/27/15 1045 08/27/15 1322  BP: 94/52 111/82 111/80 94/65  Pulse: 65 85 95 103  Temp:    101.7 F (38.7 C)  TempSrc:    Oral  Resp: 20 20 18 18   Height:      Weight:   72 lb 5 oz (32.8 kg)   SpO2:    97%   Weight change: -1 lb 8.7 oz (-0.7 kg)  Intake/Output Summary (Last 24 hours) at 08/27/15 1330 Last data filed at 08/27/15 1045  Gross per 24 hour  Intake    240 ml  Output   1708 ml  Net  -1468 ml   General: cachectic woman, resting in bed during dialysis, no acute distress Cardiac: RRR, systolic murmur heard best at LUS border Pulm: clear to auscultation bilaterally Abd: soft, nondistended, nontender, hypoactive bowel sounds Ext: thin extremities, no pedal edema   Assessment/Plan: Active Problems:   Hyperparathyroidism, secondary renal (HCC)   Depression   GERD   Chronic pancreatitis (HCC)   Seizure disorder (HCC)   Severe protein-calorie malnutrition (HCC)   Anemia of chronic disease   Folate deficiency   Lupus (systemic lupus erythematosus) (HCC)   ESRD (end stage renal disease) on dialysis (HCC)   Recurrent Clostridium difficile diarrhea   B12 deficiency   Cirrhosis (HCC)   Anemia due to bone marrow failure (HCC)   Fever and chills   Pancytopenia (HCC)   Thrombocytopenia (HCC)   Persistent fever   Alcoholic cirrhosis of liver without ascites (HCC)  Persistent C. Difficile diarrhea: Patient with continued loose semi-formed stool. Patient reporting some subjective improvement. She is now on Dificid (Fidaxomicin) after failure of oral Vancomycin. She remains a high risk for deterioration without hospital supervision and PT is now recommending SNF due to increased weakness and assistance needs.  -Appreciate  ID and Pharmacy assistance -Dificid 200 mg po BID for 10 days (3/19>>3/28) -Will need outpatient ID appointment -Continue lactobacillus 1 g TID   Persistent fever: Tmax during admission of 103 with daily fever spikes. Tmax yesterday was 99.3 after IV Zosyn initiated. RIJ was placed yesterday evening as peripheral vascular access has been difficult to obtain and maintain. Patient with possible secondary infection in setting of colitis. Cultures so far have been negative. -IV Zosyn (3/23 >> ) empirically -f/u repeat urine/blood cultures   Severe thrombocytopenia 2/2 Liver disease: She has chronic thrombocytopenia, with platelets lowest at 15-16K during admission, 30K this morning. She has had intermittent episodes of epistaxis, none today. Her lower than baseline thrombocytopenia may be related to the Vancomycin, which we have discontinued. She has also been on immunosuppressants for her lupus which can cause thrombocytopenia. -Holding Azathioprine  -Monitor for bleeding   ESRD on MWF HD: Hgb 10.7 this morning. On regular HD schedule. -HD per nephrology   SLE: Less likely to have lupus flare as dsDNA is not elevated. -Continue Plaquenil 400 mg daily  -Holding Azathiopine 50 mg daily in setting of severe thrombocytopenia   Chronic pancreatitis -Continue Creon 12,000 units TID  Depression -Continue Prozac 10 mg daily -Continue Mirtazapine 7.5 mg daily     Dispo: Disposition is deferred at this time, awaiting improvement of current medical problems.      LOS: 8 days  Zada Finders, MD 08/27/2015, 1:30 PM

## 2015-08-27 NOTE — Progress Notes (Addendum)
0842 Patient's blood glucose 41 per POC, 1 am Dextrose given, will continue to monitor patient. 0900 Patient's blood glucose 78, gave patient grape juice to drink.  Will continue to monitor patient.

## 2015-08-27 NOTE — Progress Notes (Signed)
Nutrition Follow-up  DOCUMENTATION CODES:   Severe malnutrition in context of chronic illness, Underweight  INTERVENTION:   -Continue Ensure Enlive po TID, each supplement provides 350 kcal and 20 grams of protein  NUTRITION DIAGNOSIS:   Malnutrition related to chronic illness as evidenced by severe depletion of muscle mass, severe depletion of body fat.  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 She was recently admitted on 08/05/15 for C. Diff diarrhea and Influenza A. Patient states she has been taking vancomycin regularly since her discharge on 08/07/15 and finished the course of antibiotic 2-3 days ago. States she did not have any improvement in her diarrhea despite being compliant with the medication and still continues to have severe diarrhea which she describes as brown in color and mixed with white mucus-like material. Denies having any bloody diarrhea. Reports having mild occasional lower abdominal pain. States she does not have any nausea/ vomiting and continues to have adequate oral intake. However, does report feeling tired. States she was due for dialysis yesterday but could not go since she woke up covered in feces.   Pt off unit at time of visit. Plan for HD today.   Meal completion variable per doc flowsheets (PO: 0-80%). Observed breakfast meal tray completion at bedside. Meal tray unattempted. However, noted pt consumed 75% of Ensure supplement.   Pt still with loose BMs.  CSW following.  Plan is to d/c to SNF once medically stable.   Labs reviewed. Na: 134 (on IV supplementation), CBGS: 41-78.   Diet Order:  Diet regular Room service appropriate?: Yes; Fluid consistency:: Thin; Fluid restriction:: 1200 mL Fluid  Skin:  Wound (see comment) (stage II sacrum)  Last BM:  08/27/15  Height:   Ht Readings from Last 1 Encounters:  08/19/15 5\' 1"  (1.549 m)    Weight:   Wt Readings from Last 1 Encounters:  08/27/15 72 lb 5 oz (32.8 kg)    Ideal Body Weight:  47.7 kg  BMI:  Body mass index is 13.67 kg/(m^2).  Estimated Nutritional Needs:   Kcal:  1300-1500  Protein:  60-75 grams  Fluid:  >1.3 L  EDUCATION NEEDS:   Education needs addressed  Escarlet Saathoff A. Jimmye Norman, RD, LDN, CDE Pager: [phone] After hours Pager: [phone]

## 2015-08-27 NOTE — Progress Notes (Addendum)
PT Cancellation Note  Patient Details Name: [name] [name] MRN: [id number] DOB: [birth year]   Cancelled Treatment:    Reason Eval/Treat Not Completed: Patient at procedure or test/unavailable, will follow.    Blondell Reveal Kistler 08/27/2015, 9:16 AM [phone]

## 2015-08-27 NOTE — Care Management Important Message (Signed)
Important Message  Patient Details  Name: [name] [name] MRN: [id number] Date of Birth: 26-Mar-1956   Medicare Important Message Given:  Yes    Carles Collet, RN 08/27/2015, 12:55 [place] Message  Patient Details  Name: [name] [name] MRN: [id number] Date of Birth: 1955/08/28   Medicare Important Message Given:  Yes    Carles Collet, RN 08/27/2015, 12:54 PM

## 2015-08-27 NOTE — Progress Notes (Signed)
[name], female   DOB: [birth year], 60 y.o.   MRN: [id number] Medicine attending: I examined this patient this morning together with resident physician Dr. Zada Finders and I concur with his evaluation and management plan which we discussed together. IV line in her right hand didn't last for more than 24 hours. We had to go ahead and get radiology assistant and put in a right IJ line to continue antibiotics. Maximum temperature 99.3 since starting Zosyn yesterday. Urine appeared infected. Cultures pending. No acute findings on exam. Subjectively she feels a little better. She states she had 2 bowel movements yesterday and 2 today so far. Platelet count slightly better today at 30,000. Hemoglobin 10.7. White count of 4600. Liver functions last checked on March 17 At that point alkaline phosphatase was falling. Mild transaminase elevations stable. elevated bilirubin also stable at 6 mg compare with 1.5 on March 3. [id number] and folic acid levels normal despite macrocytosis. Anti-double-stranded DNA is not elevated at this time. Impression: #1. Resistant and recurrent C. difficile colitis now on Fidaxamicin day 5. Started to show some signs of improvement with semi-formed stools with decreased frequency. #2. Pancytopenia with acute temperature spike to 103 infection versus drug reaction. She was recultured. Vancomycin stopped. Platelet count has improved but still low. #3. Acute on chronic liver failure with history of alcoholic hepatitis #4. Oral candida infection nystatin started 3/23 #5. Seizure disorder #6. End-stage renal disease on dialysis #7. Systemic lupus. Anti-DS DNA nonelevated at this time. She remains on Plaquenil. Holding azathioprine. #8. Multifactorial macrocytic anemia including acute illness, chronic illness, chronic renal failure, and suspect red cell membrane changes from chronic alcohol use in the past. #9. Persistent fever. Risk versus benefit of addition of  broad-spectrum antibiotics in favor of the benefit given her immunocompromised status,  high risk for secondary infection with persistent colitis, and potentially infected urine with cultures pending.

## 2015-08-27 NOTE — Progress Notes (Addendum)
Addendum:   CSW spoke with patient's daughter. She states [employer] would be fine and that patient uses Medicaid transportation to get to her dialysis appointments.     Patient's daughter is looking at [employer]. Patient will need transportation to dialysis.  Percell Locus Keeley Sussman LCSWA [phone]

## 2015-08-28 DIAGNOSIS — D696 Thrombocytopenia, unspecified: Secondary | ICD-10-CM

## 2015-08-28 DIAGNOSIS — K769 Liver disease, unspecified: Secondary | ICD-10-CM

## 2015-08-28 DIAGNOSIS — L8990 Pressure ulcer of unspecified site, unspecified stage: Secondary | ICD-10-CM | POA: Insufficient documentation

## 2015-08-28 DIAGNOSIS — F329 Major depressive disorder, single episode, unspecified: Secondary | ICD-10-CM

## 2015-08-28 DIAGNOSIS — Z992 Dependence on renal dialysis: Secondary | ICD-10-CM

## 2015-08-28 DIAGNOSIS — A047 Enterocolitis due to Clostridium difficile: Principal | ICD-10-CM

## 2015-08-28 DIAGNOSIS — R509 Fever, unspecified: Secondary | ICD-10-CM

## 2015-08-28 DIAGNOSIS — M3214 Glomerular disease in systemic lupus erythematosus: Secondary | ICD-10-CM

## 2015-08-28 DIAGNOSIS — N186 End stage renal disease: Secondary | ICD-10-CM

## 2015-08-28 DIAGNOSIS — K861 Other chronic pancreatitis: Secondary | ICD-10-CM

## 2015-08-28 LAB — GLUCOSE, CAPILLARY
GLUCOSE-CAPILLARY: 88 mg/dL (ref 65–99)
Glucose-Capillary: 67 mg/dL (ref 65–99)

## 2015-08-28 LAB — CULTURE, BLOOD (ROUTINE X 2)
CULTURE: NO GROWTH
CULTURE: NO GROWTH

## 2015-08-28 MED ORDER — IOHEXOL 300 MG/ML  SOLN
25.0000 mL | INTRAMUSCULAR | Status: AC
  Administered 2015-08-28 (×2): 25 mL via ORAL

## 2015-08-28 NOTE — Progress Notes (Signed)
Hypoglycemic Event  CBG: 67  Treatment: 15 GM carbohydrate snack  Symptoms: None  Follow-up CBG: Time: 0647 CBG Result: 88  Possible Reasons for Event: Inadequate meal intake  Comments/MD notified: 7:50 AM Dr. Posey Pronto paged    Teryl Lucy

## 2015-08-28 NOTE — Progress Notes (Signed)
Internal Medicine Attending:   I saw and examined the patient. I reviewed the resident's note and I agree with the resident's findings and plan as documented in the resident's note.  60 year old immunosuppressed woman with end-stage renal disease admitted with complicated C. difficile colitis. Now being treated with a Fidaxomicin, had four loose bowel movements yesterday. Had a fever yesterday to 101.7, she has been having almost daily fevers since 3/17. Her abdomen today is very thin with moderate tenderness on palpation. Last blood culture 3/22 is no growth at 3 days. Today is day #4 of empiric pip/tazo for possible gut translocation due to colitis. I agree with the house staff's plan to get a CT abdomen and pelvis today to rule out peri-colonic abscess as the cause of the persistent fever. The patient is ESRD and makes minimal urine, so I think IV contrast is appropriate in this case.

## 2015-08-28 NOTE — Progress Notes (Addendum)
Assessment: 1. Recurrent Cdiff- off vanc, on fidaxomicin, still diarrhea w abd pain and fever. For abd CT per primary team.  2. Severe protein calorie malnutrition  3. ESRD - MWF HD , no heparin 4. Volume - at dry wt 5. SLE- meds on hold 6. Thrombocytopenia - severe. Azathioprine held. Plts 20-30 k 7. Depression-  per primary 8. Seizures - keppra 9. Liver disease - Increased Bili 6.0- w/u per primary 10. Hx pancreatitis on Creon 11. 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 Low phos, no binder, +vitD 12. Anemia of CKD/ chron disease/nosebleed - Hb up > 10.7, hold esa for now 13. Asterixis - due to neurontin toxicity, resolved 14. BP - no BP meds 15. Fever - day #4 IV zosyn   Plan - HD Monday. Abd CT today.    Kelly Splinter MD [facility] pager [phone]    cell [phone] 08/28/2015, 4:17 PM    [facility] MWF  4h  4k/2.25 bath  34.5kg  LUA AVG  Hep 2000  Qb 350   Mircera 225 last 2/15 Calcitriol 1.25 ug Hb 8.9  pth 298   Subjective: not much better   Objective: Vital signs in last 24 hours: Temp:  [97.4 F (36.3 C)-98.4 F (36.9 C)] 98.4 F (36.9 C) (03/25 1452) Pulse Rate:  [66-83] 83 (03/25 1452) Resp:  [14-18] 16 (03/25 1452) BP: (94-104)/(61-71) 94/61 mmHg (03/25 1452) SpO2:  [98 %-100 %] 98 % (03/25 1452) Weight:  [33.158 kg (73 lb 1.6 oz)] 33.158 kg (73 lb 1.6 oz) (03/25 0539) Weight change: -2.1 kg (-4 lb 10.1 oz)  Intake/Output from previous day: 03/24 0701 - 03/25 0700 In: 240 [P.O.:240] Out: 1708  Intake/Output this shift: Total I/O In: 60 [P.O.:60] Out: -  Exam Awake, lethargic, slurred speech, very weak No jvd Chest clear bilat RRR no mrg Abd firm, +bs nontender MS no LE edema LUA AVG +bruit Neuro ox 3, nf  Lab Results:  Recent Labs  08/26/15 0602 08/27/15 0741  WBC 4.1 4.6  HGB 11.6* 10.7*  HCT 34.3* 32.4*  PLT 21* 30*   BMET:   Recent Labs  08/26/15 0602 08/27/15 0741  NA 135 134*  K 3.6 3.6  CL 99* 99*  CO2 27 27   GLUCOSE 62* 61*  BUN <5* 5*  CREATININE 2.15* 3.39*  CALCIUM 7.8* 7.9*   No results for input(s): PTH in the last 72 hours. Iron Studies: No results for input(s): IRON, TIBC, TRANSFERRIN, FERRITIN in the last 72 hours. Studies/Results: Ir Fluoro Guide Cv Line Right  08/26/2015  INDICATION: 60 year old with persistent fever and diarrhea. EXAM: FLUOROSCOPIC AND ULTRASOUND GUIDED PLACEMENT OF A CENTRAL VENOUS CATHETER Physician: Stephan Minister. Henn, MD FLUOROSCOPY TIME:  24 seconds, 1 mGy MEDICATIONS: None ANESTHESIA/SEDATION: None PROCEDURE: Informed consent was obtained for placement of a central venous catheter. The patient was placed supine on the interventional table. Ultrasound confirmed a patent right internal jugularvein. Ultrasound images were obtained for documentation. The right side of the neck was prepped and draped in a sterile fashion. The right side of the neck was anesthetized with 1% lidocaine. Maximal barrier sterile technique was utilized including caps, mask, sterile gowns, sterile gloves, sterile drape, hand hygiene and skin antiseptic. A small incision was made with #11 blade scalpel. A 21 gauge needle directed into the right internal jugular vein with ultrasound guidance. A wire was advanced into the central venous system and a peel-away sheath was placed. A dual-lumen Power PICC line was cut  to 13 cm. PICC line was advanced through the peel-away sheath and positioned at the superior cavoatrial junction. Both lumens aspirated and flushed well. Catheter was sutured to the neck. A dressing was placed. Fluoroscopic and ultrasound images were taken and saved for documentation. FINDINGS: Catheter tip at the superior cavoatrial junction. COMPLICATIONS: None IMPRESSION: Successful placement of a central venous catheter using ultrasound and fluoroscopic guidance. Electronically Signed   By: Markus Daft M.D.   On: 08/26/2015 18:03   Ir US Guide Vasc Access Right  08/26/2015  INDICATION: 60 year old  with persistent fever and diarrhea. EXAM: FLUOROSCOPIC AND ULTRASOUND GUIDED PLACEMENT OF A CENTRAL VENOUS CATHETER Physician: Stephan Minister. Henn, MD FLUOROSCOPY TIME:  24 seconds, 1 mGy MEDICATIONS: None ANESTHESIA/SEDATION: None PROCEDURE: Informed consent was obtained for placement of a central venous catheter. The patient was placed supine on the interventional table. Ultrasound confirmed a patent right internal jugularvein. Ultrasound images were obtained for documentation. The right side of the neck was prepped and draped in a sterile fashion. The right side of the neck was anesthetized with 1% lidocaine. Maximal barrier sterile technique was utilized including caps, mask, sterile gowns, sterile gloves, sterile drape, hand hygiene and skin antiseptic. A small incision was made with #11 blade scalpel. A 21 gauge needle directed into the right internal jugular vein with ultrasound guidance. A wire was advanced into the central venous system and a peel-away sheath was placed. A dual-lumen Power PICC line was cut to 13 cm. PICC line was advanced through the peel-away sheath and positioned at the superior cavoatrial junction. Both lumens aspirated and flushed well. Catheter was sutured to the neck. A dressing was placed. Fluoroscopic and ultrasound images were taken and saved for documentation. FINDINGS: Catheter tip at the superior cavoatrial junction. COMPLICATIONS: None IMPRESSION: Successful placement of a central venous catheter using ultrasound and fluoroscopic guidance. Electronically Signed   By: Markus Daft M.D.   On: 08/26/2015 18:03    Scheduled: . sodium chloride   Intravenous Once  . feeding supplement (ENSURE ENLIVE)  237 mL Oral TID WC  . fidaxomicin  200 mg Oral BID  . FLUoxetine  10 mg Oral Daily  . folic acid  1 mg Oral Daily  . hydroxychloroquine  400 mg Oral Daily  . lactobacillus  1 g Oral TID  . levETIRAcetam  250 mg Oral Daily  . levETIRAcetam  500 mg Oral Q M,W,F-1800  .  lipase/protease/amylase  12,000 Units Oral TID AC  . mirtazapine  7.5 mg Oral QHS  . multivitamin  1 tablet Oral QHS  . nystatin  5 mL Oral QID  . pantoprazole  40 mg Oral Daily  . piperacillin-tazobactam (ZOSYN)  IV  2.25 g Intravenous Q8H  . sodium chloride flush  10-40 mL Intracatheter Q12H  . thiamine  100 mg Oral Daily    LOS: 9 days   Vernita Tague D 08/28/2015,4:17 PM

## 2015-08-28 NOTE — Progress Notes (Signed)
Subjective: Feeling better. She reports she had 4 BM yesterday and 3 this morning. The stools are becoming more formed. Denies nausea or vomiting. Tolerating PO intake. Some abdominal pain. She produces urine occasionally but not daily.  Objective: Vital signs in last 24 hours: Filed Vitals:   08/27/15 1500 08/27/15 2235 08/28/15 0539 08/28/15 0552  BP:  97/70  104/71  Pulse:  66  66  Temp: 99.3 F (37.4 C) 97.4 F (36.3 C)  97.9 F (36.6 C)  TempSrc: Axillary Oral    Resp:  14  18  Height:      Weight:   73 lb 1.6 oz (33.158 kg)   SpO2:  100%  100%   Weight change: -4 lb 10.1 oz (-2.1 kg)  Intake/Output Summary (Last 24 hours) at 08/28/15 [id number] Last data filed at 08/27/15 1832  Gross per 24 hour  Intake    240 ml  Output   1708 ml  Net  -1468 ml   General Apperance: NAD HEENT: Normocephalic, atraumatic, anicteric sclera Neck: Supple, trachea midline Lungs: Clear to auscultation bilaterally. No wheezes, rhonchi or rales. Breathing comfortably Heart: Regular rate and rhythm Abdomen: Soft, mild tenderness to palpation diffusely, nondistended, no rebound/guarding Extremities: Warm and well perfused, no edema Skin: No rashes or lesions Neurologic: Alert and interactive. No gross deficits.  Lab Results: Basic Metabolic Panel:  Recent Labs Lab 08/24/15 0536  08/26/15 0602 08/27/15 0741  NA 137  < > 135 134*  K 4.3  < > 3.6 3.6  CL 101  < > 99* 99*  CO2 25  < > 27 27  GLUCOSE 110*  < > 62* 61*  BUN 5*  < > <5* 5*  CREATININE 2.99*  < > 2.15* 3.39*  CALCIUM 8.0*  < > 7.8* 7.9*  PHOS 1.5*  --   --   --   < > = values in this interval not displayed.  Liver Function Tests:  Recent Labs Lab 08/24/15 0536  ALBUMIN <1.0*   CBC:  Recent Labs Lab 08/23/15 0540  08/26/15 0602 08/27/15 0741  WBC 4.3  < > 4.1 4.6  NEUTROABS 2.9  --   --   --   HGB 8.0*  < > 11.6* 10.7*  HCT 24.1*  < > 34.3* 32.4*  MCV 106.2*  < > 94.2 96.1  PLT 15*  < > 21* 30*  < > =  values in this interval not displayed.  CBG:  Recent Labs Lab 08/25/15 2106 08/27/15 0842 08/27/15 0903 08/28/15 0609 08/28/15 0647  GLUCAP 82 41* 78 67 88   Coagulation:  Recent Labs Lab 08/25/15 0610  LABPROT 22.7*  INR 2.01*   Anemia Panel:  Recent Labs Lab 08/26/15 0602  VITAMINB12 2868*  FOLATE 16.8   Micro Results: Recent Results (from the past 240 hour(s))  MRSA PCR Screening     Status: None   Collection Time: 08/19/15  5:44 PM  Result Value Ref Range Status   MRSA by PCR NEGATIVE NEGATIVE Final    Comment:        The GeneXpert MRSA Assay (FDA approved for NASAL specimens only), is one component of a comprehensive MRSA colonization surveillance program. It is not intended to diagnose MRSA infection nor to guide or monitor treatment for MRSA infections.   C difficile quick scan w PCR reflex     Status: Abnormal   Collection Time: 08/20/15  7:41 AM  Result Value Ref Range Status   C Diff antigen  POSITIVE (A) NEGATIVE Final   C Diff toxin POSITIVE (A) NEGATIVE Final   C Diff interpretation Positive for toxigenic C. difficile  Final    Comment: CRITICAL RESULT CALLED TO, READ BACK BY AND VERIFIED WITH: R. L'Esperance RN 11:25 08/20/15 ([email or web address])   Gastrointestinal Panel by PCR , Stool     Status: None   Collection Time: 08/20/15  7:41 AM  Result Value Ref Range Status   Campylobacter species NOT DETECTED NOT DETECTED Final   Plesimonas shigelloides NOT DETECTED NOT DETECTED Final   Salmonella species NOT DETECTED NOT DETECTED Final   Yersinia enterocolitica NOT DETECTED NOT DETECTED Final   Vibrio species NOT DETECTED NOT DETECTED Final   Vibrio cholerae NOT DETECTED NOT DETECTED Final   Enteroaggregative E coli (EAEC) NOT DETECTED NOT DETECTED Final   Enteropathogenic E coli (EPEC) NOT DETECTED NOT DETECTED Final   Enterotoxigenic E coli (ETEC) NOT DETECTED NOT DETECTED Final   Shiga like toxin producing E coli (STEC) NOT DETECTED NOT DETECTED  Final   E. coli O157 NOT DETECTED NOT DETECTED Final   Shigella/Enteroinvasive E coli (EIEC) NOT DETECTED NOT DETECTED Final   Cryptosporidium NOT DETECTED NOT DETECTED Final   Cyclospora cayetanensis NOT DETECTED NOT DETECTED Final   Entamoeba histolytica NOT DETECTED NOT DETECTED Final   Giardia lamblia NOT DETECTED NOT DETECTED Final   Adenovirus F40/41 NOT DETECTED NOT DETECTED Final   Astrovirus NOT DETECTED NOT DETECTED Final   Norovirus GI/GII NOT DETECTED NOT DETECTED Final   Rotavirus A NOT DETECTED NOT DETECTED Final   Sapovirus (I, II, IV, and V) NOT DETECTED NOT DETECTED Final  Culture, blood (Routine X 2) w Reflex to ID Panel     Status: None   Collection Time: 08/20/15  9:22 AM  Result Value Ref Range Status   Specimen Description BLOOD BLOOD RIGHT HAND  Final   Special Requests IN PEDIATRIC BOTTLE 1CC  Final   Culture NO GROWTH 5 DAYS  Final   Report Status 08/25/2015 FINAL  Final  Culture, blood (Routine X 2) w Reflex to ID Panel     Status: None   Collection Time: 08/20/15  9:25 AM  Result Value Ref Range Status   Specimen Description BLOOD RIGHT ANTECUBITAL  Final   Special Requests IN PEDIATRIC BOTTLE 2CC  Final   Culture NO GROWTH 5 DAYS  Final   Report Status 08/25/2015 FINAL  Final  Culture, blood (Routine X 2) w Reflex to ID Panel     Status: None (Preliminary result)   Collection Time: 08/23/15  2:40 PM  Result Value Ref Range Status   Specimen Description BLOOD RIGHT HAND  Final   Special Requests IN PEDIATRIC BOTTLE 1CC  Final   Culture NO GROWTH 4 DAYS  Final   Report Status PENDING  Incomplete  Culture, blood (Routine X 2) w Reflex to ID Panel     Status: None (Preliminary result)   Collection Time: 08/23/15  2:45 PM  Result Value Ref Range Status   Specimen Description BLOOD RIGHT HAND  Final   Special Requests IN PEDIATRIC BOTTLE 1CC  Final   Culture NO GROWTH 4 DAYS  Final   Report Status PENDING  Incomplete  Culture, blood (Routine X 2) w Reflex  to ID Panel     Status: None (Preliminary result)   Collection Time: 08/25/15  4:45 PM  Result Value Ref Range Status   Specimen Description BLOOD RIGHT HAND  Final   Special Requests BOTTLES DRAWN AEROBIC  ONLY 5CC  Final   Culture NO GROWTH 2 DAYS  Final   Report Status PENDING  Incomplete  Culture, blood (Routine X 2) w Reflex to ID Panel     Status: None (Preliminary result)   Collection Time: 08/25/15  7:22 PM  Result Value Ref Range Status   Specimen Description BLOOD RIGHT HAND  Final   Special Requests IN PEDIATRIC BOTTLE 1CC  Final   Culture NO GROWTH 2 DAYS  Final   Report Status PENDING  Incomplete    Medications: I have reviewed the patient's current medications. Scheduled Meds: . sodium chloride   Intravenous Once  . feeding supplement (ENSURE ENLIVE)  237 mL Oral TID WC  . fidaxomicin  200 mg Oral BID  . FLUoxetine  10 mg Oral Daily  . folic acid  1 mg Oral Daily  . hydroxychloroquine  400 mg Oral Daily  . lactobacillus  1 g Oral TID  . levETIRAcetam  250 mg Oral Daily  . levETIRAcetam  500 mg Oral Q M,W,F-1800  . lipase/protease/amylase  12,000 Units Oral TID AC  . mirtazapine  7.5 mg Oral QHS  . multivitamin  1 tablet Oral QHS  . nystatin  5 mL Oral QID  . pantoprazole  40 mg Oral Daily  . piperacillin-tazobactam (ZOSYN)  IV  2.25 g Intravenous Q8H  . sodium chloride flush  10-40 mL Intracatheter Q12H  . thiamine  100 mg Oral Daily   Continuous Infusions:  PRN Meds:.sodium chloride, sodium chloride, acetaminophen **OR** acetaminophen, albuterol, alteplase, heparin, lidocaine (PF), lidocaine-prilocaine, ondansetron **OR** ondansetron (ZOFRAN) IV, pentafluoroprop-tetrafluoroeth, sodium chloride flush Assessment/Plan: 60 year old woman with ESRD with lupus nephritis who presented with persistent c. diff diarrhea after recently completing PO vanc course.   Persistent C. Difficile Diarrhea: Patient with continued loose semi-formed stool. PT recommends SNF due to  increased weakness and assistance needs.  -Dificid 200 mg po BID for 10 days (3/19>>3/28) -Will need outpatient ID appointment -Continue lactobacillus 1 g TID  Persistent fever: Tmax during admission of 103 with daily fever spikes. Tmax yesterday was 101.7. Patient with possible secondary infection in setting of colitis. Cultures so far have been negative. -IV Zosyn (3/23 >> )  -f/u repeat urine/blood cultures -CT abdomen/pelvis with contrast  Severe thrombocytopenia 2/2 liver disease: She has chronic thrombocytopenia, with platelets lowest at 15-16K during admission, 30K yesterday. She has had intermittent episodes of epistaxis, none today. Her lower than baseline thrombocytopenia may be related to the Vancomycin, which we have discontinued. She has also been on immunosuppressants for her lupus which can cause thrombocytopenia. -Holding Azathioprine  -Monitor for bleeding  ESRD on MWF HD: On regular HD schedule. -HD per nephrology  SLE: Less likely to have lupus flare as dsDNA is not elevated. -Continue Plaquenil 400 mg daily  -Holding Azathiopine 50 mg daily in setting of severe thrombocytopenia  Chronic pancreatitis -Continue Creon 12,000 units TID  Depression -Continue Prozac 10 mg daily -Continue Mirtazapine 7.5 mg daily   FEN: Regular diet  Dispo: Disposition is deferred at this time, awaiting improvement of current medical problems.  Anticipated discharge in approximately 1-2 day(s).   The patient does have a current PCP Shela Leff, MD) and does need an [facility] hospital follow-up appointment after discharge.  The patient does not have transportation limitations that hinder transportation to clinic appointments.  .Services Needed at time of discharge: Y = Yes, Blank = No PT:   OT:   RN:   Equipment:   Other:  LOS: 9 days   Milagros Loll, MD 08/28/2015, 9:53 AM

## 2015-08-29 ENCOUNTER — Inpatient Hospital Stay (HOSPITAL_COMMUNITY): Payer: Medicare Other

## 2015-08-29 MED ORDER — IOHEXOL 300 MG/ML  SOLN
25.0000 mL | INTRAMUSCULAR | Status: AC
  Administered 2015-08-29 (×2): 25 mL via INTRAVENOUS

## 2015-08-29 MED ORDER — IOPAMIDOL (ISOVUE-300) INJECTION 61%
INTRAVENOUS | Status: AC
  Administered 2015-08-29: 10:00:00
  Administered 2015-08-29: 100 mL
  Filled 2015-08-29: qty 100

## 2015-08-29 NOTE — Progress Notes (Signed)
Assessment: 1. Recurrent Cdiff- off vanc, on Dificid for 10 day course. To have abd CT today 2. 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 Cirrhosis (by CT 2/17) / ^Tbili/ jaundice  4. Severe protein calorie malnutrition 5. ESRD - MWF HD , no heparin 6. Volume - just under dry wt, no vol excess 7. SLE- meds on hold 8. Thrombocytopenia - severe. Azathioprine held. Plts 20-30 k 9. Depression-  per primary 10. Seizures - keppra 11. 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 Low phos, no binder, +vitD 13. Anemia of CKD/ chron disease/nosebleed - Hb up > 10.7, hold esa for now 14. Asterixis - due to neurontin toxicity, resolved 15. Hx etoh abuse   Plan - HD tomorrow, keep even. Multi-organ failure, poor prognosis. Consider [facility] for goals of care.    Kelly Splinter MD [facility] pager [phone]    cell [phone] 08/29/2015, 10:31 AM    [facility] MWF  4h  4k/2.25 bath  34.5kg  LUA AVG  Hep 2000  Qb 350   Mircera 225 last 2/15 Calcitriol 1.25 ug Hb 8.9  pth 298   Subjective: not much better   Objective: Vital signs in last 24 hours: Temp:  [98.4 F (36.9 C)-98.8 F (37.1 C)] 98.4 F (36.9 C) (03/26 0534) Pulse Rate:  [76-92] 76 (03/26 0534) Resp:  [16] 16 (03/26 0534) BP: (94-116)/(61-72) 116/72 mmHg (03/26 0534) SpO2:  [98 %-100 %] 100 % (03/26 0534) Weight:  [33.339 kg (73 lb 8 oz)] 33.339 kg (73 lb 8 oz) (03/26 0627) Weight change: 0.539 kg (1 lb 3 oz)  Intake/Output from previous day: 03/25 0701 - 03/26 0700 In: 345.2 [P.O.:240; I.V.:5.2; IV Piggyback:100] Out: -  Intake/Output this shift:   Exam Awake, no distress No jvd Chest clear bilat RRR no mrg Abd firm, +bs nontender MS no LE edema LUA AVG +bruit Neuro ox 3, nf  Lab Results:  Recent Labs  08/27/15 0741 08/29/15 0545  WBC 4.6 6.2  HGB 10.7* 11.2*  HCT 32.4* 32.8*  PLT 30* 28*   BMET:   Recent Labs  08/27/15 0741  NA 134*  K  3.6  CL 99*  CO2 27  GLUCOSE 61*  BUN 5*  CREATININE 3.39*  CALCIUM 7.9*   No results for input(s): PTH in the last 72 hours. Iron Studies: No results for input(s): IRON, TIBC, TRANSFERRIN, FERRITIN in the last 72 hours. Studies/Results: No results found.  Scheduled: . feeding supplement (ENSURE ENLIVE)  237 mL Oral TID WC  . fidaxomicin  200 mg Oral BID  . FLUoxetine  10 mg Oral Daily  . folic acid  1 mg Oral Daily  . hydroxychloroquine  400 mg Oral Daily  . iopamidol      . lactobacillus  1 g Oral TID  . levETIRAcetam  250 mg Oral Daily  . levETIRAcetam  500 mg Oral Q M,W,F-1800  . lipase/protease/amylase  12,000 Units Oral TID AC  . mirtazapine  7.5 mg Oral QHS  . multivitamin  1 tablet Oral QHS  . nystatin  5 mL Oral QID  . pantoprazole  40 mg Oral Daily  . piperacillin-tazobactam (ZOSYN)  IV  2.25 g Intravenous Q8H  . sodium chloride flush  10-40 mL Intracatheter Q12H  . thiamine  100 mg Oral Daily    LOS: 10 days   [name] [name] D 08/29/2015,10:31 AM

## 2015-08-29 NOTE — Progress Notes (Signed)
CRITICAL VALUE ALERT  Critical value received:  Platelet count 28  Date of notification:  08/29/15  Time of notification:  08:29  Critical value read back:Yes.    Nurse who received alert:  Rosalio Loud, RN  MD notified (1st page):  Yes   Time of first page:  8:30  MD notified (2nd page):  Time of second page:  Responding MD:  Dr. Posey Pronto  Time MD responded:  8:32

## 2015-08-29 NOTE — Progress Notes (Signed)
   Subjective: Patient states she is feeling a little better, but has continued diarrhea which occurred all night. She says her bowel movements are sometimes loose and sometimes more formed. She reports occasional chills and intermittent abdominal pain. She denies fevers, nausea, vomiting, or any bleeding.  Objective: Vital signs in last 24 hours: Filed Vitals:   08/28/15 1452 08/28/15 2144 08/29/15 0534 08/29/15 0627  BP: 94/61 94/66 116/72   Pulse: 83 92 76   Temp: 98.4 F (36.9 C) 98.8 F (37.1 C) 98.4 F (36.9 C)   TempSrc: Oral Oral Oral   Resp: 16 16 16    Height:      Weight:    73 lb 8 oz (33.339 kg)  SpO2: 98% 99% 100%    Weight change: 1 lb 3 oz (0.539 kg)  Intake/Output Summary (Last 24 hours) at 08/29/15 [id number] Last data filed at 08/29/15 0610  Gross per 24 hour  Intake 345.17 ml  Output      0 ml  Net 345.17 ml   General: cachectic woman, sitting up in bed, no acute distress Cardiac: RRR, systolic murmur heard best at LUS border Pulm: clear to auscultation bilaterally Abd: soft, mild central abdominal tenderness, nondistended, BS present Ext: thin extremities, no pedal edema    Assessment/Plan: 60 year old woman with ESRD with lupus nephritis who presented with persistent c. diff diarrhea after recently completing PO vanc course.   Persistent C. Difficile Diarrhea: Patient with continued loose semi-formed stool. PT recommends SNF due to increased weakness and assistance needs.  -Dificid 200 mg po BID for 10 days (3/19>>3/28) -Will need outpatient ID appointment -Continue lactobacillus 1 g TID  Persistent fever: Tmax during admission of 103 with daily fever spikes. Tmax yesterday was 98.8. Patient with possible secondary infection in setting of colitis. Cultures so far have been negative. -IV Zosyn (3/23 >> )  -f/u repeat urine/blood cultures -CT abdomen/pelvis with contrast pending  Severe thrombocytopenia 2/2 liver disease: She has chronic  thrombocytopenia, with platelets lowest at 15-16K during admission, 28K this morning. She has had intermittent episodes of epistaxis, none today. Her lower than baseline thrombocytopenia may be related to the Vancomycin, which we have discontinued. She has also been on immunosuppressants for her lupus which can cause thrombocytopenia. -Holding Azathioprine  -Monitor for bleeding  ESRD on MWF HD: On regular HD schedule. -HD per nephrology  SLE: Less likely to have lupus flare as dsDNA is not elevated. -Continue Plaquenil 400 mg daily  -Holding Azathiopine 50 mg daily in setting of severe thrombocytopenia  Chronic pancreatitis -Continue Creon 12,000 units TID  Depression -Continue Prozac 10 mg daily -Continue Mirtazapine 7.5 mg daily    Dispo: Disposition is deferred at this time, awaiting improvement of current medical problems.  Anticipated discharge to SNF in approximately 1-3 day(s).    LOS: 10 days   Zada Finders, MD 08/29/2015, 9:06 AM

## 2015-08-30 ENCOUNTER — Other Ambulatory Visit: Payer: Self-pay | Admitting: *Deleted

## 2015-08-30 ENCOUNTER — Encounter: Payer: Self-pay | Admitting: *Deleted

## 2015-08-30 ENCOUNTER — Encounter (HOSPITAL_COMMUNITY): Payer: Self-pay | Admitting: General Surgery

## 2015-08-30 LAB — CBC
HCT: 32.8 % — ABNORMAL LOW (ref 36.0–46.0)
HEMATOCRIT: 32.8 % — AB (ref 36.0–46.0)
HEMOGLOBIN: 10.8 g/dL — AB (ref 12.0–15.0)
Hemoglobin: 11.2 g/dL — ABNORMAL LOW (ref 12.0–15.0)
MCH: 33.8 pg (ref 26.0–34.0)
MCH: 32.7 pg (ref 26.0–34.0)
MCHC: 34.1 g/dL (ref 30.0–36.0)
MCHC: 32.9 g/dL (ref 30.0–36.0)
MCV: 99.1 fL (ref 78.0–100.0)
MCV: 99.4 fL (ref 78.0–100.0)
PLATELETS: 28 10*3/uL — AB (ref 150–400)
Platelets: 34 10*3/uL — ABNORMAL LOW (ref 150–400)
RBC: 3.31 MIL/uL — AB (ref 3.87–5.11)
RBC: 3.3 MIL/uL — AB (ref 3.87–5.11)
RDW: 22.1 % — ABNORMAL HIGH (ref 11.5–15.5)
RDW: 22.4 % — AB (ref 11.5–15.5)
WBC: 6.2 10*3/uL (ref 4.0–10.5)
WBC: 6.1 10*3/uL (ref 4.0–10.5)

## 2015-08-30 LAB — RENAL FUNCTION PANEL
Albumin: <1 g/dL — ABNORMAL LOW (ref 3.5–5.0)
Anion gap: 11 (ref 5–15)
BUN: 7 mg/dL (ref 6–20)
CHLORIDE: 97 mmol/L — AB (ref 101–111)
CO2: 22 mmol/L (ref 22–32)
Calcium: 7.8 mg/dL — ABNORMAL LOW (ref 8.9–10.3)
Creatinine, Ser: 4.41 mg/dL — ABNORMAL HIGH (ref 0.44–1.00)
GFR calc Af Amer: 12 mL/min — ABNORMAL LOW (ref >60)
GFR calc non Af Amer: 10 mL/min — ABNORMAL LOW (ref >60)
GLUCOSE: 87 mg/dL (ref 65–99)
POTASSIUM: 3.4 mmol/L — AB (ref 3.5–5.1)
Phosphorus: 2.2 mg/dL — ABNORMAL LOW (ref 2.5–4.6)
Sodium: 130 mmol/L — ABNORMAL LOW (ref 135–145)

## 2015-08-30 LAB — CULTURE, BLOOD (ROUTINE X 2)
CULTURE: NO GROWTH
Culture: NO GROWTH

## 2015-08-30 LAB — HEPATIC FUNCTION PANEL
ALK PHOS: 79 U/L (ref 38–126)
ALT: 11 U/L — AB (ref 14–54)
AST: 43 U/L — ABNORMAL HIGH (ref 15–41)
Albumin: <1 g/dL — ABNORMAL LOW (ref 3.5–5.0)
BILIRUBIN DIRECT: 2.5 mg/dL — AB (ref 0.1–0.5)
BILIRUBIN INDIRECT: 2 mg/dL — AB (ref 0.3–0.9)
Total Bilirubin: 4.5 mg/dL — ABNORMAL HIGH (ref 0.3–1.2)
Total Protein: 6.9 g/dL (ref 6.5–8.1)

## 2015-08-30 MED ORDER — ALTEPLASE 2 MG IJ SOLR: 2.0000 mg | Freq: Once | INTRAMUSCULAR | Status: DC | PRN

## 2015-08-30 MED ORDER — LIDOCAINE-PRILOCAINE 2.5-2.5 % EX CREA: 1.0000 "application " | TOPICAL_CREAM | CUTANEOUS | Status: DC | PRN

## 2015-08-30 MED ORDER — SODIUM CHLORIDE 0.9 % IV SOLN: 100.0000 mL | INTRAVENOUS | Status: DC | PRN

## 2015-08-30 MED ORDER — HEPARIN SODIUM (PORCINE) 1000 UNIT/ML DIALYSIS: 1000.0000 [IU] | INTRAMUSCULAR | Status: DC | PRN

## 2015-08-30 MED ORDER — LIDOCAINE HCL (PF) 1 % IJ SOLN: 5.0000 mL | INTRAMUSCULAR | Status: DC | PRN

## 2015-08-30 MED ORDER — PENTAFLUOROPROP-TETRAFLUOROETH EX AERO: 1.0000 "application " | INHALATION_SPRAY | CUTANEOUS | Status: DC | PRN

## 2015-08-30 NOTE — Progress Notes (Addendum)
   08/30/15 1508  PT Visit Information  Last PT Received On 08/30/15  Assistance Needed +1  Reason Eval/Treat Not Completed Patient declined, no reason specified (Pt just returned from HD reports fatigue and platelets are also low.  Pt reports "I am tired please try tomorrow.")  History of Present Illness 60 y.o. female admitted for weakness, nausea, and chronic diarrhea. 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   Governor Rooks, PTA pager [phone]

## 2015-08-30 NOTE — Progress Notes (Signed)
   Subjective: Patient says her bowel movements are sometimes loose and sometimes thickened. She reports 5 movements yesterday and 2 this morning. She is sitting up eating breakfast when seen   Objective: Vital signs in last 24 hours: Filed Vitals:   08/30/15 1230 08/30/15 1300 08/30/15 1330 08/30/15 1345  BP: 115/78 120/79 126/78 139/83  Pulse: 72 75 67 72  Temp:    97.5 F (36.4 C)  TempSrc:    Oral  Resp:  '14 12 14  '$ Height:      Weight:      SpO2:       Weight change: 2 lb 1.6 oz (0.953 kg)  Intake/Output Summary (Last 24 hours) at 08/30/15 1520 Last data filed at 08/30/15 1345  Gross per 24 hour  Intake    250 ml  Output   -600 ml  Net    850 ml   General: cachectic woman, sitting up in bed, no acute distress Cardiac: RRR, systolic murmur heard best at LUS border Pulm: clear to auscultation bilaterally Abd: soft, mild central abdominal tenderness, nondistended, BS present Ext: thin extremities, no pedal edema    Assessment/Plan: 60 year old woman with ESRD with lupus nephritis who presented with persistent c. diff diarrhea after recently completing PO vanc course.   Persistent C. Difficile Diarrhea: Patient with continued loose semi-formed stool. PT recommends SNF due to increased weakness and assistance needs. Discussed with ID that 10 day course of Dificid is appropriate. Can consider IV Metronidazole if no improvement. Possible candidate for fecal transplant. Will schedule outpatient follow up with Dr. Baxter Flattery in the ID clinic. -Dificid 200 mg po BID for 10 days (3/19>>3/28) -Outpatient ID appointment -Continue lactobacillus 1 g TID  Persistent fever: Tmax during admission of 103 with daily fever spikes. Tmax yesterday was 98.3. Patient with possible secondary infection in setting of colitis. Cultures so far have been negative. -Off IV Zosyn (3/23 >> 3/26 )  -f/u repeat urine/blood cultures >> NGTD -CT abdomen/pelvis without abscess, shows some improvement of  colitis  Severe thrombocytopenia 2/2 liver disease: She has chronic thrombocytopenia, with platelets lowest at 15-16K during admission, 34K this morning. She has had intermittent episodes of epistaxis, none today. Her lower than baseline thrombocytopenia may be related to the Vancomycin, which we have discontinued. She has also been on immunosuppressants for her lupus which can cause thrombocytopenia. Alk phos improved to 79 from 167 on admission. Tbili still elevated but improved from 6.0 to 4.5. -Holding Azathioprine  -Monitor for bleeding  ESRD on MWF HD: On regular HD schedule. AVF malfunctioned with possible clotting off. IR to evaluate per nephrology. -HD per nephrology  SLE: Less likely to have lupus flare as dsDNA is not elevated. -Continue Plaquenil 400 mg daily  -Holding Azathiopine 50 mg daily in setting of severe thrombocytopenia  Chronic pancreatitis -Continue Creon 12,000 units TID  Depression -Continue Prozac 10 mg daily -Continue Mirtazapine 7.5 mg daily    Dispo:  Anticipated discharge to SNF in approximately 1-2 day(s).    LOS: 11 days   Zada Finders, MD 08/30/2015, 3:20 PM

## 2015-08-30 NOTE — Patient Outreach (Signed)
Member remains hospitalized, greater than 10 days.  Will notify care management assistant and physician of case closure.  According to notes, plan for disposition is SNF.  Hospital liaisons notified, will make appropriate referral upon discharge.  [name], BSN, [place] [facility]  [facility] Care Manager [phone]

## 2015-08-30 NOTE — Procedures (Signed)
Patient was seen on dialysis and the procedure was supervised. BFR 150 Via LUE BP is 120/79.  Patient's AVF has been malfunctioning and had increased in arterial and venous pressures and appears to be clotting off despite having good BP and platelets of 30k.  Will rinse back and have IR evaluate for declot although may be an issue given thrombocytopenia.

## 2015-08-30 NOTE — Progress Notes (Signed)
Occupational Therapy Treatment Patient Details Name: [name] [name] MRN: [id number] DOB: 04/22/1956 Today's Date: 08/30/2015    History of present illness 60 y.o. female admitted for weakness, nausea, and chronic diarrhea. 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    OT comments  Pt continues to be generally weak impacting independence in self care and ADL transfers. Plan is now for SNF.  Follow Up Recommendations  Supervision/Assistance - 24 hour;SNF    Equipment Recommendations       Recommendations for Other Services      Precautions / Restrictions Precautions Precautions: Fall       Mobility Bed Mobility Overal bed mobility: Modified Independent             General bed mobility comments: HOB up, use of rails  Transfers                      Balance     Sitting balance-Leahy Scale: Good       Standing balance-Leahy Scale: Fair Standing balance comment: can release walker and use hands for ADL                    ADL Overall ADL's : Needs assistance/impaired Eating/Feeding: Independent;Bed level   Grooming: Wash/dry hands;Supervision/safety;Standing Grooming Details (indicate cue type and reason): Standing at sink after toileting         Upper Body Dressing : Set up;Sitting   Lower Body Dressing: Set up;Sitting/lateral leans Lower Body Dressing Details (indicate cue type and reason): donned socks Toilet Transfer: Supervision/safety;Stand-pivot Toilet Transfer Details (indicate cue type and reason): used BSC due to urgency Toileting- Clothing Manipulation and Hygiene: Sit to/from stand;Minimal assistance       Functional mobility during ADLs: Supervision/safety;Cueing for safety;Rolling walker General ADL Comments:  NT assisting pt with bath upon OTs arrival.  Pt allows NT to do most of bath, educated pt in benefits of activity for strengthening and to increase endurance and decrease dependence.       Vision                     Perception     Praxis      Cognition   Behavior During Therapy: Flat affect Overall Cognitive Status: Within Functional Limits for tasks assessed                       Extremity/Trunk Assessment               Exercises     Shoulder Instructions       General Comments      Pertinent Vitals/ Pain       Pain Assessment: No/denies pain  Home Living                                          Prior Functioning/Environment              Frequency Min 2X/week     Progress Toward Goals  OT Goals(current goals can now be found in the care plan section)  Progress towards OT goals: Progressing toward goals     Plan Discharge plan needs to be updated    Co-evaluation  End of Session     Activity Tolerance Patient tolerated treatment well   Patient Left in bed;with call bell/phone within reach;with bed alarm set;with family/visitor present   Nurse Communication          Time: [id number] OT Time Calculation (min): 16 min  Charges: OT General Charges $OT Visit: 1 Procedure OT Treatments $Self Care/Home Management : 8-22 mins  Malka So 08/30/2015, 9:38 AM  [phone]

## 2015-08-30 NOTE — Consult Note (Signed)
   [facility] Inpatient Consult   08/30/2015  [name] [name] September 12, 1955 [id number] Patient has been active [place] Management services.  However, Epic reveals patient's discharge plan is likely to a Skilled nursing facility.  Patient with long length of stay greater than 10 days,with Chronic Renal failure, Sepsis.  [facility] Care Management services will follow for appropriate care needs at discharge. If patient's post hospital needs change please place a [facility] Care Management consult. For questions please contact:   Natividad Brood, RN BSN [facility] Liaison  [phone] business mobile phone Toll free office [phone]

## 2015-08-30 NOTE — Progress Notes (Signed)
[name], female   DOB: 12/12/1955, 60 y.o.   MRN: [id number] Medicine attending: I examined this patient this morning together with resident physician Dr. Zada Finders and I concur with his evaluation and management plan which we discussed together. Broad-spectrum antibiotics discontinued over the weekend. She remains on oral deficit to treat recurrent C. difficile colitis. She is now afebrile times almost 72 hours. CT scan done yesterday shows no intra-abdominal abscess. Improving inflammatory changes of the colon. Still reporting about 4 loose bowel movements daily. Abdomen is soft. Minimally tender. Platelet count remains low but stable at 34,000. Hemoglobin 10.8. White count has recovered to normal now 6100. Repeat liver profile shows improvement in bilirubin down from 6 to 4.5. Alkaline phosphatase has normalized. Transaminases normal. Albumin decreased less than 1. Her daughter is present at the bedside. Status reviewed. We will now begin discharge planning. Patient will be best served by a extended care facility. Daughter is in agreement with this plan.

## 2015-08-30 NOTE — Consult Note (Signed)
Chief Complaint: clotted AV graft (left)  Referring Physician:Dr. Donato Heinz  Supervising Physician: Jacqulynn Cadet  HPI: [name] [name] is an 60 y.o. female with multiple medical problems including ESRD on HD who has a left AV graft in place.  She was noticed today during her treatment to have high arterial and venous blood pressures concerning for possible clot.  She does have cirrhosis with a platelet count of 34K today.  Her last INR was 2.0 with elevated LFTs.  We have been asked to see her for declot of her graft.  Past Medical History:  Past Medical History  Diagnosis Date  . Anemia, B12 deficiency   . History of acute pancreatitis   . Right knee pain     No recent imaging on chart  . 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 Dr. Mancel Bale, [facility]  . Hypertriglyceridemia   . GERD (gastroesophageal reflux disease)   . Subdural hematoma ([place]) 02/2008    Likely 2/2 trauma from seizure from EtOH withdrawal, chronic in nature, sees Dr. Jerene Bears. Most recent CT head 10/2009 showing stable but persistent hematoma without mass effect.  . History of seizure disorder     Likely 2/2 alcohol abuse  . Hypocalcemia   . Hypomagnesemia   . Failure to thrive in childhood     Unclear etiology  . HTN (hypertension)   . Thrombocytopenia ([place])   . Hepatomegaly     On exam  . Joint pain   . Alcohol abuse   . Vitamin D deficiency   . Pancreatitis   . Insomnia   . Hyperlipidemia   . Pernicious anemia   . Macrocytic anemia   . 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 Depression   . Fx humeral neck 04/17/2011    Transverse fracture- minimally displaced- managed as outpatient   . ABNORMAL PAP SMEAR, LGSIL 07/23/2008    Annotation: HPV positive CIN I Dr. Mancel Bale, [facility] Qualifier: Diagnosis of  By: Oretha Ellis    . Pneumonia 05/20/2012  . Arthritis     "shoulders" (08/15/2013)  .  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  Marland Kitchen Chronic diastolic CHF (congestive heart failure) ([place])   . Hypomagnesemia   . Seizures ([place])     "don't know when/why I had them; daughter was always there w/me"  . On home oxygen therapy     "2L; 24/7" (07/31/2015)  . Shortness of breath dyspnea   . Pleural effusion   . Renal insufficiency     Past Surgical History:  Past Surgical History  Procedure Laterality Date  . Cesarean section  1983  . Esophagogastroduodenoscopy  07/11/2011    Procedure: ESOPHAGOGASTRODUODENOSCOPY (EGD);  Surgeon: Beryle Beams, MD;  Location: [facility];  Service: Endoscopy;  Laterality: N/A;  . Colonoscopy  07/11/2011    Procedure: COLONOSCOPY;  Surgeon: Beryle Beams, MD;  Location: [facility];  Service: Endoscopy;  Laterality: N/A;  . Eye surgery Left     "trauma"  . Right colectomy  08/28/2011  . Esophagogastroduodenoscopy N/A 12/01/2012    Procedure: ESOPHAGOGASTRODUODENOSCOPY (EGD);  Surgeon: Irene Shipper, MD;  Location: [facility];  Service: Endoscopy;  Laterality: N/A;  . Colonoscopy with esophagogastroduodenoscopy (egd) Left 08/21/2013    Procedure: COLONOSCOPY WITH ESOPHAGOGASTRODUODENOSCOPY (EGD);  Surgeon: Beryle Beams, MD;  Location: [place];  Service:  Endoscopy;  Laterality: Left;  . Subxyphoid pericardial window N/A 12/04/2013    Procedure: SUBXYPHOID PERICARDIAL WINDOW WITH TEE;  Surgeon: Ivin Poot, MD;  Location: [place];  Service: Thoracic;  Laterality: N/A;  . Intraoperative transesophageal echocardiogram N/A 12/04/2013    Procedure: INTRAOPERATIVE TRANSESOPHAGEAL ECHOCARDIOGRAM;  Surgeon: Ivin Poot, MD;  Location: [place];  Service: Open Heart Surgery;  Laterality: N/A;  . Exchange of a dialysis catheter Left 10/22/2014    Procedure: EXCHANGE OF A DIALYSIS CATHETER;  Surgeon: Conrad Sparks, MD;  Location: [place];  Service: Vascular;  Laterality: Left;  . Av fistula placement Left 12/03/2014     Procedure: INSERTION OF ARTERIOVENOUS (AV) GORE-TEX GRAFT ARM;  Surgeon: Mal Misty, MD;  Location: [facility] OR;  Service: Vascular;  Laterality: Left;    Family History:  Family History  Problem Relation Age of Onset  . Cancer Mother     Died from stomach cancer and "flesh eating rash  . Heart failure Father     Died in 43s from an MI  . Alcohol abuse Sister     Twin sister drinks a lot, as did both her parents and brothers  . Stroke Brother     Has 7 brothers, 1 with CVA  . Lupus Mother     Social History:  reports that she quit smoking about 4 years ago. Her smoking use included Cigarettes. She has a 20 pack-year smoking history. She has never used smokeless tobacco. She reports that she drinks alcohol. She reports that she uses illicit drugs (Marijuana and Cocaine).  Allergies:  Allergies  Allergen Reactions  . Amitriptyline Hcl Swelling    In the face.  . Doxycycline Hyclate Itching    Feels like something crawling under her skin    Medications:   Medication List    TAKE these medications        ACIDOPHILUS PROBIOTIC PO  Take 2 mg by mouth 3 (three) times daily. Take 2 (two) tablets by mouth three times daily     albuterol 108 (90 Base) MCG/ACT inhaler  Commonly known as:  PROAIR HFA  Inhale 1-2 puffs into the lungs every 6 (six) hours as needed for wheezing or shortness of breath.     azaTHIOprine 50 MG tablet  Commonly known as:  IMURAN  Take 50 mg by mouth daily.     calcium carbonate 500 MG chewable tablet  Commonly known as:  TUMS - dosed in mg elemental calcium  Chew 1,000 tablets by mouth at bedtime.     camphor-menthol lotion  Commonly known as:  SARNA  Apply 1 application topically daily as needed for itching.     cetirizine 10 MG tablet  Commonly known as:  ZYRTEC  Take 1 tablet (10 mg total) by mouth daily as needed for allergies.     CREON 12000 units Cpep capsule  Generic drug:  lipase/protease/amylase  Take 1 capsule (12,000 Units total) by  mouth 3 (three) times daily before meals.     Darbepoetin Alfa 150 MCG/0.3ML Sosy injection  Commonly known as:  ARANESP  Inject 0.3 mLs (150 mcg total) into the vein every Wednesday with hemodialysis.     FLUoxetine 10 MG capsule  Commonly known as:  PROZAC  Take 1 capsule (10 mg total) by mouth daily. For depression     folic acid 1 MG tablet  Commonly known as:  FOLVITE  Take 1 tablet (1 mg total) by mouth daily. For folic acid replacement  gabapentin 300 MG capsule  Commonly known as:  NEURONTIN  Take 1 capsule (300 mg total) by mouth daily.     hydroxychloroquine 200 MG tablet  Commonly known as:  PLAQUENIL  Take 400 mg by mouth daily.     levETIRAcetam 250 MG tablet  Commonly known as:  KEPPRA  Take 1 tablet (250 mg total) by mouth daily. Take additional 549m (2 tablets) after Dialysis treatments on Monday, Wednesday, Friday     mirtazapine 7.5 MG tablet  Commonly known as:  REMERON  Take 1 tablet (7.5 mg total) by mouth at bedtime.     multivitamin with minerals Tabs tablet  Take 1 tablet by mouth daily. For vitamin replacement     omeprazole 40 MG capsule  Commonly known as:  PRILOSEC  Take 1 capsule (40 mg total) by mouth daily.     thiamine 100 MG tablet  Commonly known as:  VITAMIN B-1  Take 1 tablet (100 mg total) by mouth daily. For low thiamine     vitamin B-12 250 MCG tablet  Commonly known as:  CYANOCOBALAMIN  Take 1 tablet (250 mcg total) by mouth every evening.      ASK your doctor about these medications        vancomycin 50 mg/mL oral solution  Commonly known as:  VANCOCIN  Take 2.5 mLs (125 mg total) by mouth 4 (four) times daily. SRichardson Landry Ask about: Which instructions should I use?     vancomycin 50 mg/mL oral solution  Commonly known as:  VANCOCIN  Take 2.5 mLs (125 mg total) by mouth 2 (two) times daily. SRichardson Landry Start taking on:  09/03/2015  Ask about: Which instructions should I use?     vancomycin 50 mg/mL oral solution  Commonly  known as:  VANCOCIN  Take 2.5 mLs (125 mg total) by mouth daily. SRichardson Landry Start taking on:  09/11/2015  Ask about: Which instructions should I use?     vancomycin 50 mg/mL oral solution  Commonly known as:  VANCOCIN  Take 2.5 mLs (125 mg total) by mouth every other day. SRichardson Landry Start taking on:  09/18/2015  Ask about: Which instructions should I use?     vancomycin 50 mg/mL oral solution  Commonly known as:  VANCOCIN  Take 2.5 mLs (125 mg total) by mouth every 3 (three) days. SRichardson Landry Start taking on:  09/26/2015  Ask about: Which instructions should I use?        Please HPI for pertinent positives, otherwise complete 10 system ROS negative.  Mallampati Score: MD Evaluation Airway: WNL Heart: WNL Abdomen: WNL Chest/ Lungs: WNL ASA  Classification: 3 Mallampati/Airway Score: One  Physical Exam: BP 136/87 mmHg  Pulse 73  Temp(Src) 98.4 F (36.9 C) (Oral)  Resp 16  Ht 5' 1"  (1.549 m)  Wt 84 lb 3.5 oz (38.2 kg)  BMI 15.92 kg/m2  SpO2 100% Body mass index is 15.92 kg/(m^2). General: pleasant, but frail appearing black female who is laying in bed in NAD HEENT: head is normocephalic, atraumatic.  Sclera are slightly icteric.  PERRL.  Ears and nose without any masses or lesions.  Mouth is pink and moist Heart: regular, rate, and rhythm.  Normal s1,s2. No obvious murmurs, gallops, or rubs noted.  Palpable radial and pedal pulses bilaterally Lungs: CTAB, no wheezes, rhonchi, or rales noted.  Respiratory effort nonlabored Abd: soft, NT, ND, +BS, no masses, hernias, or organomegaly MS: all 4 extremities are symmetrical with no cyanosis, clubbing, or  edema.  She has an AV graft in place in her LUE with no thrill or pulse present Psych: A&Ox3 with a flat affect.   Labs: Results for orders placed or performed during the hospital encounter of 08/19/15 (from the past 48 hour(s))  CBC     Status: Abnormal   Collection Time: 08/29/15  5:45 AM  Result Value Ref Range   WBC 6.2 4.0 - 10.5  K/uL   RBC 3.31 (L) 3.87 - 5.11 MIL/uL   Hemoglobin 11.2 (L) 12.0 - 15.0 g/dL   HCT 32.8 (L) 36.0 - 46.0 %   MCV 99.1 78.0 - 100.0 fL   MCH 33.8 26.0 - 34.0 pg   MCHC 34.1 30.0 - 36.0 g/dL   RDW 22.1 (H) 11.5 - 15.5 %   Platelets 28 (LL) 150 - 400 K/uL    Comment: REPEATED TO VERIFY SPECIMEN CHECKED FOR CLOTS PLATELET COUNT CONFIRMED BY SMEAR V SMITH,RN 0829 08/29/15 D BRADLEY CRITICAL RESULT CALLED TO, READ BACK BY AND VERIFIED WITH: V SMITH,RN 0829 08/29/15 D BRADLEY   CBC     Status: Abnormal   Collection Time: 08/30/15  5:00 AM  Result Value Ref Range   WBC 6.1 4.0 - 10.5 K/uL   RBC 3.30 (L) 3.87 - 5.11 MIL/uL   Hemoglobin 10.8 (L) 12.0 - 15.0 g/dL   HCT 32.8 (L) 36.0 - 46.0 %   MCV 99.4 78.0 - 100.0 fL   MCH 32.7 26.0 - 34.0 pg   MCHC 32.9 30.0 - 36.0 g/dL   RDW 22.4 (H) 11.5 - 15.5 %   Platelets 34 (L) 150 - 400 K/uL    Comment: SPECIMEN CHECKED FOR CLOTS REPEATED TO VERIFY CONSISTENT WITH PREVIOUS RESULT   Renal function panel     Status: Abnormal   Collection Time: 08/30/15  5:00 AM  Result Value Ref Range   Sodium 130 (L) 135 - 145 mmol/L   Potassium 3.4 (L) 3.5 - 5.1 mmol/L   Chloride 97 (L) 101 - 111 mmol/L   CO2 22 22 - 32 mmol/L   Glucose, Bld 87 65 - 99 mg/dL   BUN 7 6 - 20 mg/dL   Creatinine, Ser 4.41 (H) 0.44 - 1.00 mg/dL   Calcium 7.8 (L) 8.9 - 10.3 mg/dL   Phosphorus 2.2 (L) 2.5 - 4.6 mg/dL   Albumin <1.0 (L) 3.5 - 5.0 g/dL   GFR calc non Af Amer 10 (L) >60 mL/min   GFR calc Af Amer 12 (L) >60 mL/min    Comment: (NOTE) The eGFR has been calculated using the CKD EPI equation. This calculation has not been validated in all clinical situations. eGFR's persistently <60 mL/min signify possible Chronic Kidney Disease.    Anion gap 11 5 - 15  Hepatic function panel     Status: Abnormal   Collection Time: 08/30/15  2:15 PM  Result Value Ref Range   Total Protein 6.9 6.5 - 8.1 g/dL   Albumin <1.0 (L) 3.5 - 5.0 g/dL   AST 43 (H) 15 - 41 U/L   ALT 11  (L) 14 - 54 U/L   Alkaline Phosphatase 79 38 - 126 U/L   Total Bilirubin 4.5 (H) 0.3 - 1.2 mg/dL   Bilirubin, Direct 2.5 (H) 0.1 - 0.5 mg/dL   Indirect Bilirubin 2.0 (H) 0.3 - 0.9 mg/dL    Imaging: Ct Abdomen Pelvis W Contrast  08/29/2015  CLINICAL DATA:  C .  Diff colitis under treatment, persistent fever EXAM: CT ABDOMEN AND PELVIS  WITH CONTRAST TECHNIQUE: Multidetector CT imaging of the abdomen and pelvis was performed using the standard protocol following bolus administration of intravenous contrast. CONTRAST:  1 ISOVUE-300 IOPAMIDOL (ISOVUE-300) INJECTION 61% COMPARISON:  07/30/2015 FINDINGS: Lower chest: There is small left pleural effusion. Atelectasis noted in left lower lobe posteriorly. Hepatobiliary: Again noted heterogeneous enhancement of the liver with nodular contour suspicious for cirrhosis. No focal hepatic mass. No intrahepatic biliary ductal dilatation. No calcified gallstones are noted within gallbladder. There is small perihepatic ascites. Pancreas: Again noted multiple pancreatic calcification and dilatation of main pancreatic duct consistent with chronic calcific pancreatitis. No definite focal mass. Spleen: No focal mass.  Trace perisplenic ascites. Adrenals/Urinary Tract: No adrenal gland mass. Enhanced kidneys are symmetrical in size. No hydronephrosis or hydroureter. Mild delay excretion bilateral kidney on delayed images. No focal hepatic mass. Stomach/Bowel: No gastric outlet obstruction. There is no small bowel obstruction. Contrast material noted within colon and rectum. Small free fluid noted in right paracolic gutter. There is small two moderate pelvic ascites. There is residual mild thickening of descending colon and proximal sigmoid colon wall. Findings probable due to improving colitis. There is no evidence of colonic perforation. No pericolonic abscess or contrast material. Again noted status post right hemicolectomy. No evidence of anastomotic stricture.  Vascular/Lymphatic: No aortic aneurysm. No mesenteric adenopathy. No inguinal adenopathy. Small retroperitoneal lymph nodes are stable. Portal vein measures 7.4 mm in diameter without evidence of portal hypertension. Reproductive: The uterus and ovaries are unremarkable. No adnexal mass. Other: There is no evidence of free abdominal air. Small nonspecific bilateral inguinal lymph nodes. Musculoskeletal: No destructive bony lesions are noted. No acute fractures. IMPRESSION: 1. There is small left pleural effusion with left lower lobe posterior atelectasis. 2. Again noted heterogeneous enhancement of the liver with mild nodular contour suspicious for cirrhosis. No focal hepatic mass. Mild perihepatic and perisplenic ascites. Mild ascites in right paracolic gutter. 3. Moderate pelvic ascites. 4. No small bowel obstruction. 5. Again noted chronic calcific pancreatitis and chronic dilatation of main pancreatic duct. 6. No hydronephrosis or hydroureter. Mild delay excretion bilateral kidneys. 7. No colonic obstruction. There is residual mild thickening of distal colonic wall probable improving colitis. No evidence of pericolonic abscess or perforation. No extraluminal contrast material is noted. Electronically Signed   By: Lahoma Crocker M.D.   On: 08/29/2015 13:14    Assessment/Plan 1. Clotted AV graft in LUE -D/W Dr. Laurence Ferrari.  We will plan for a declot tomorrow of her graft.  If for some reason we are unable to do this then we can place a temp catheter or a tunneled cath if needed. -recheck INR in am -NPO p MN -Risks and Benefits discussed with the patient including, but not limited to bleeding, infection, vascular injury, pulmonary embolism, need for tunneled HD catheter placement or even death. All of the patient's questions were answered, patient is agreeable to proceed. Consent signed and in chart.   Thank you for this interesting consult.  I greatly enjoyed meeting [name] [name] and look forward to  participating in their care.  A copy of this report was sent to the requesting provider on this date.  Electronically Signed: Henreitta Cea 08/30/2015, 4:18 PM   I spent a total of 40 Minutes  in face to face in clinical consultation, greater than 50% of which was counseling/coordinating care for clotted AV graft, needs declot

## 2015-08-30 NOTE — Care Management Important Message (Signed)
Important Message  Patient Details  Name: [name] [name] MRN: [id number] Date of Birth: 1955/12/07   Medicare Important Message Given:  Yes    Zenon Mayo, RN 08/30/2015, 12:29 [place] Message  Patient Details  Name: [name] [name] MRN: [id number] Date of Birth: 1955/08/05   Medicare Important Message Given:  Yes    Zenon Mayo, RN 08/30/2015, 12:29 PM

## 2015-08-31 DIAGNOSIS — E43 Unspecified severe protein-calorie malnutrition: Secondary | ICD-10-CM

## 2015-08-31 LAB — COMPREHENSIVE METABOLIC PANEL
ALK PHOS: 79 U/L (ref 38–126)
ALT: 13 U/L — ABNORMAL LOW (ref 14–54)
ANION GAP: 7 (ref 5–15)
AST: 41 U/L (ref 15–41)
BILIRUBIN TOTAL: 4.3 mg/dL — AB (ref 0.3–1.2)
BUN: 6 mg/dL (ref 6–20)
CALCIUM: 7.8 mg/dL — AB (ref 8.9–10.3)
CO2: 25 mmol/L (ref 22–32)
Chloride: 102 mmol/L (ref 101–111)
Creatinine, Ser: 4.09 mg/dL — ABNORMAL HIGH (ref 0.44–1.00)
GFR calc Af Amer: 13 mL/min — ABNORMAL LOW (ref >60)
GFR, EST NON AFRICAN AMERICAN: 11 mL/min — AB (ref >60)
GLUCOSE: 71 mg/dL (ref 65–99)
Potassium: 3.5 mmol/L (ref 3.5–5.1)
Sodium: 134 mmol/L — ABNORMAL LOW (ref 135–145)
TOTAL PROTEIN: 6.5 g/dL (ref 6.5–8.1)

## 2015-08-31 LAB — CBC
HCT: 32.7 % — ABNORMAL LOW (ref 36.0–46.0)
HEMOGLOBIN: 10.5 g/dL — AB (ref 12.0–15.0)
MCH: 32 pg (ref 26.0–34.0)
MCHC: 32.1 g/dL (ref 30.0–36.0)
MCV: 99.7 fL (ref 78.0–100.0)
Platelets: 33 10*3/uL — ABNORMAL LOW (ref 150–400)
RBC: 3.28 MIL/uL — ABNORMAL LOW (ref 3.87–5.11)
RDW: 22.5 % — AB (ref 11.5–15.5)
WBC: 6.6 10*3/uL (ref 4.0–10.5)

## 2015-08-31 LAB — PROTIME-INR
INR: 2.66 — AB (ref 0.00–1.49)
PROTHROMBIN TIME: 28 s — AB (ref 11.6–15.2)

## 2015-08-31 MED ORDER — ALTEPLASE 100 MG IV SOLR
4.0000 mg | Freq: Once | INTRAVENOUS | Status: AC
  Administered 2015-09-01: 2 mg
  Filled 2015-08-31 (×2): qty 4

## 2015-08-31 NOTE — Progress Notes (Signed)
Physical Therapy Treatment Patient Details Name: [name] [name] MRN: [id number] DOB: [birth year] Today's Date: 08/31/2015    History of Present Illness 60 y.o. female admitted for weakness, nausea, and chronic diarrhea. 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     PT Comments    Pt in bed on arrival required encouragement to participate with PTA.  Pt tolerated increased gait distance with decreased assistance.  Better carryover and improved safety with RW.  Pt educated to continue walking with staff on unit to improve strength and endurance.    Follow Up Recommendations  SNF     Equipment Recommendations  Rolling walker with 5" wheels    Recommendations for Other Services       Precautions / Restrictions Precautions Precautions: Fall Precaution Comments: pt denies h/o falls Restrictions Weight Bearing Restrictions: No    Mobility  Bed Mobility Overal bed mobility: Modified Independent Bed Mobility: Supine to Sit;Sit to Supine     Supine to sit: Modified independent (Device/Increase time);HOB elevated Sit to supine: Modified independent (Device/Increase time);HOB elevated   General bed mobility comments: HOB up, use of rails  Transfers Overall transfer level: Needs assistance Equipment used: Rolling walker (2 wheeled)   Sit to Stand: Modified independent (Device/Increase time) Stand pivot transfers: Min guard       General transfer comment: demonstrates good technique.    Ambulation/Gait Ambulation/Gait assistance: Min guard Ambulation Distance (Feet): 280 Feet Assistive device: Rolling walker (2 wheeled) Gait Pattern/deviations: Narrow base of support;Decreased stride length Gait velocity: decreased   General Gait Details: no LOB, steady  with RW   Stairs            Wheelchair Mobility    Modified Rankin (Stroke Patients Only)       Balance     Sitting balance-Leahy Scale: Normal       Standing balance-Leahy Scale: Good                      Cognition Arousal/Alertness: Awake/alert Behavior During Therapy: WFL for tasks assessed/performed Overall Cognitive Status: Within Functional Limits for tasks assessed                      Exercises      General Comments        Pertinent Vitals/Pain Pain Assessment: No/denies pain    Home Living                      Prior Function            PT Goals (current goals can now be found in the care plan section) Acute Rehab PT Goals Patient Stated Goal: to get better Potential to Achieve Goals: Good Progress towards PT goals: Progressing toward goals    Frequency  Min 3X/week    PT Plan Current plan remains appropriate    Co-evaluation             End of Session Equipment Utilized During Treatment: Gait belt Activity Tolerance: Patient tolerated treatment well Patient left: in bed;with call bell/phone within reach;with bed alarm set     Time: 1041-1055 PT Time Calculation (min) (ACUTE ONLY): 14 min  Charges:  $Gait Training: 8-22 mins  G Codes:      Cristela Blue 08/31/2015, 11:02 AM  Governor Rooks, PTA pager [phone]

## 2015-08-31 NOTE — Progress Notes (Addendum)
[name], female   DOB: Mar 23, 1956, 60 y.o.   MRN: [id number]  [facility] Progress Note    Subjective:   Feels better today   Objective:   BP 127/78 mmHg  Pulse 60  Temp(Src) 97.4 F (36.3 C) (Oral)  Resp 16  Ht 5\' 1"  (1.549 m)  Wt 35.2 kg (77 lb 9.6 oz)  BMI 14.67 kg/m2  SpO2 100%  Intake/Output: I/O last 3 completed shifts: In: 510 [P.O.:390; I.V.:120] Out: -600    Intake/Output this shift:    Weight change: 3.908 kg (8 lb 9.9 oz)  Physical Exam: [id number], cachectic AAF in NAD CVS:no rub Resp:cta [id number] Ext:no edema, LUE AVG without thrill or bruit  Labs: BMET  Recent Labs Lab 08/25/15 0610 08/26/15 0602 08/27/15 0741 08/30/15 0500 08/30/15 1415 08/31/15 0430  NA 135 135 134* 130*  --  134*  K 4.8 3.6 3.6 3.4*  --  3.5  CL 101 99* 99* 97*  --  102  CO2 26 27 27 22   --  25  GLUCOSE 69 62* 61* 87  --  71  BUN 8 <5* 5* 7  --  6  CREATININE 3.97* 2.15* 3.39* 4.41*  --  4.09*  ALBUMIN  --   --   --  <1.0* <1.0* <1.0*  CALCIUM 8.0* 7.8* 7.9* 7.8*  --  7.8*  PHOS  --   --   --  2.2*  --   --    CBC  Recent Labs Lab 08/27/15 0741 08/29/15 0545 08/30/15 0500 08/31/15 0430  WBC 4.6 6.2 6.1 6.6  HGB 10.7* 11.2* 10.8* 10.5*  HCT 32.4* 32.8* 32.8* 32.7*  MCV 96.1 99.1 99.4 99.7  PLT 30* 28* 34* 33*    @IMGRELPRIORS @ Medications:    . alteplase  4 mg Intracatheter Once  . feeding supplement (ENSURE ENLIVE)  237 mL Oral TID WC  . fidaxomicin  200 mg Oral BID  . FLUoxetine  10 mg Oral Daily  . folic acid  1 mg Oral Daily  . hydroxychloroquine  400 mg Oral Daily  . lactobacillus  1 g Oral TID  . levETIRAcetam  250 mg Oral Daily  . levETIRAcetam  500 mg Oral Q M,W,F-1800  . lipase/protease/amylase  12,000 Units Oral TID AC  . mirtazapine  7.5 mg Oral QHS  . multivitamin  1 tablet Oral QHS  . nystatin  5 mL Oral QID  . pantoprazole  40 mg Oral Daily  . sodium chloride flush  10-40 mL Intracatheter Q12H  .  thiamine  100 mg Oral Daily  Dialysis Orders: [facility] M[facility] 3.5 hr 160 350/A 1.5 EDW 35.5 4 K 2.25 Ca no profile heparin 2000 Mircera 200 q 2 wks -to be given 3/1 calcitriol 1.25 left upper AVGG Recent labs: Hgb 8.8 at hospital d/c Fe 86 no tsat ferritin 1684 iPTH 298 Ca 7.2P 1.7 Alb 1.22  Assessment/ Plan:   1. 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 ESRD: only had 1.5hrs of HD yesterday due to clotted AVG for declot today and plan for HD again tomorrow 3. Anemia: cont esa 4. CKD-MBD: follow phos  5. Nutrition: severe protein and caloric malnutrition 6. Hypertension:stable 7. Thrombocytopenia- stable, off azathioprine 8. Cirrhosis 9. Disposition- poor overall prognosis, recommend Palliative care consult to help set goals/limits of care given her multiple end-stage disease processes.  Donetta Potts, MD [place] Pager [phone] 08/31/2015, 10:51 AM

## 2015-08-31 NOTE — Progress Notes (Signed)
   Subjective: During dialysis yesterday patient's left AVF was noted to be malfunctioning with possible clot. IR consulted with plan for declot today. Patient states that she has never had issues with her AVF in the past. She does report continued frequent bowel movements. Overall, she feels a little better the last couple days.  Objective: Vital signs in last 24 hours: Filed Vitals:   08/30/15 1345 08/30/15 1545 08/30/15 2154 08/31/15 0522  BP: 139/83 136/87 111/80 127/78  Pulse: 72 73 71 60  Temp: 97.5 F (36.4 C) 98.4 F (36.9 C) 97.8 F (36.6 C) 97.4 F (36.3 C)  TempSrc: Oral Oral Oral Oral  Resp: '14 16 17 16  '$ Height:      Weight:    77 lb 9.6 oz (35.2 kg)  SpO2:  100% 100% 100%   Weight change: 8 lb 9.9 oz (3.908 kg)  Intake/Output Summary (Last 24 hours) at 08/31/15 1116 Last data filed at 08/31/15 0600  Gross per 24 hour  Intake    360 ml  Output   -600 ml  Net    960 ml   General: cachectic woman, sitting up in bed, no acute distress Cardiac: RRR, systolic murmur heard best at LUS border Pulm: clear to auscultation bilaterally, diminished breath sounds lower lung fields Abd: soft, nontender, nondistended, BS present Ext: thin extremities, no pedal edema    Assessment/Plan: 60 year old woman with ESRD with lupus nephritis who presented with persistent c. diff diarrhea after recently completing PO vanc course.   Persistent C. Difficile Diarrhea: Patient with continued loose semi-formed stool. PT recommends SNF due to increased weakness and assistance needs. 10 day course of Dificid to be completed today. Can consider IV Metronidazole if no improvement. Possible candidate for fecal transplant.. -Dificid 200 mg po BID for 10 days (3/19>>3/28) -Outpatient ID appointment - 09/08/2015 -Continue lactobacillus 1 g TID  Persistent fever: Tmax yesterday was 98.4. Afebrile 72 hours. Cultures so far have been negative. 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 She has had intermittent episodes of epistaxis, none for several days. Her lower than baseline thrombocytopenia may be related to the Vancomycin, which we have discontinued. She has also been on immunosuppressants for her lupus which can cause thrombocytopenia. Alk phos improved to 79 from 167 on admission. Tbili still elevated but improved from 6.0 to 4.3. -Holding Azathioprine  -Monitor for bleeding  ESRD on MWF HD: On regular HD schedule. AVF malfunctioned with possible clotting off. IR to attempt declot today versus temporary cath/tunneled cath. -HD per nephrology -Will need usable HD access prior to discharge  SLE: Less likely to have lupus flare as dsDNA is not elevated. -Continue Plaquenil 400 mg daily  -Holding Azathiopine 50 mg daily in setting of severe thrombocytopenia  Chronic pancreatitis -Continue Creon 12,000 units TID  Depression -Continue Prozac 10 mg daily -Continue Mirtazapine 7.5 mg daily    Dispo:  Anticipated discharge to SNF in approximately 1-2 day(s).    LOS: 12 days   Zada Finders, MD 08/31/2015, 11:16 AM

## 2015-08-31 NOTE — Progress Notes (Signed)
CSW updated [facility] regarding discharge plan and alerted them that patient uses [facility] with Medicaid transportation. CSW to continue to follow.   Percell Locus Claudeen Leason LCSWA [phone]

## 2015-08-31 NOTE — Progress Notes (Signed)
[name], female   DOB: 1956-01-17, 60 y.o.   MRN: [id number] Medicine attending:  I examined this patient this morning together with resident physician Dr. Zada Finders and I concur with his evaluation and management plan which we discussed together. She has now been afebrile for greater than 72 hours off antibiotics. She continues to have multiple loose bowel movements on deficit day 10 of 10. She is severely malnourished with albumin less than 1 g percent. She may have a component of malabsorption from chronic pancreatitis and malnutrition in addition to chronic C. difficile colitis. Platelet count remains low but stable around 30,000. Improved but persistent elevation of bilirubin but normalization of previously elevated alkaline phosphatase, normal transaminases, and no focal intra-or extrahepatic obstruction on CT scan of the abdomen done on March 26. There were problems with her AV fistula during dialysis session yesterday. Dialysis treatment had to be interrupted midsession. She will undergo a vascular evaluation today to assess patency of the fistula. On my exam today, there is still a thrill over the fistula proximal left arm. She is frail, deconditioned, and will need to be monitored in a skilled nursing or extended care facility.

## 2015-09-01 ENCOUNTER — Inpatient Hospital Stay (HOSPITAL_COMMUNITY): Payer: Medicare Other

## 2015-09-01 DIAGNOSIS — T82868A Thrombosis of vascular prosthetic devices, implants and grafts, initial encounter: Secondary | ICD-10-CM | POA: Insufficient documentation

## 2015-09-01 LAB — CBC
HEMATOCRIT: 30.6 % — AB (ref 36.0–46.0)
HEMOGLOBIN: 9.9 g/dL — AB (ref 12.0–15.0)
MCH: 32.1 pg (ref 26.0–34.0)
MCHC: 32.4 g/dL (ref 30.0–36.0)
MCV: 99.4 fL (ref 78.0–100.0)
Platelets: 46 10*3/uL — ABNORMAL LOW (ref 150–400)
RBC: 3.08 MIL/uL — ABNORMAL LOW (ref 3.87–5.11)
RDW: 22.9 % — AB (ref 11.5–15.5)
WBC: 6.4 10*3/uL (ref 4.0–10.5)

## 2015-09-01 LAB — RENAL FUNCTION PANEL
ANION GAP: 9 (ref 5–15)
CO2: 24 mmol/L (ref 22–32)
Calcium: 7.9 mg/dL — ABNORMAL LOW (ref 8.9–10.3)
Chloride: 104 mmol/L (ref 101–111)
Creatinine, Ser: 4.7 mg/dL — ABNORMAL HIGH (ref 0.44–1.00)
GFR calc Af Amer: 11 mL/min — ABNORMAL LOW (ref >60)
GFR calc non Af Amer: 9 mL/min — ABNORMAL LOW (ref >60)
GLUCOSE: 64 mg/dL — AB (ref 65–99)
PHOSPHORUS: 2.9 mg/dL (ref 2.5–4.6)
POTASSIUM: 4.2 mmol/L (ref 3.5–5.1)
SODIUM: 137 mmol/L (ref 135–145)

## 2015-09-01 LAB — METHYLMALONIC ACID, SERUM: Methylmalonic Acid, Quantitative: 279 nmol/L (ref 0–378)

## 2015-09-01 MED ORDER — ACETAMINOPHEN 325 MG PO TABS
ORAL_TABLET | ORAL | Status: AC
Start: 2015-09-01 — End: 2015-09-02
  Filled 2015-09-01: qty 2

## 2015-09-01 MED ORDER — FENTANYL CITRATE (PF) 100 MCG/2ML IJ SOLN
INTRAMUSCULAR | Status: AC
  Filled 2015-09-01: qty 2

## 2015-09-01 MED ORDER — HEPARIN SODIUM (PORCINE) 1000 UNIT/ML IJ SOLN
INTRAMUSCULAR | Status: AC
  Filled 2015-09-01: qty 1

## 2015-09-01 MED ORDER — FENTANYL CITRATE (PF) 100 MCG/2ML IJ SOLN
INTRAMUSCULAR | Status: AC | PRN
  Administered 2015-09-01: 12.5 ug via INTRAVENOUS

## 2015-09-01 MED ORDER — MIDAZOLAM HCL 2 MG/2ML IJ SOLN
INTRAMUSCULAR | Status: AC
Start: 2015-09-01 — End: 2015-09-01
  Filled 2015-09-01: qty 2

## 2015-09-01 MED ORDER — MIDAZOLAM HCL 2 MG/2ML IJ SOLN
INTRAMUSCULAR | Status: AC | PRN
  Administered 2015-09-01: 0.5 mg via INTRAVENOUS

## 2015-09-01 MED ORDER — LIDOCAINE HCL 1 % IJ SOLN
INTRAMUSCULAR | Status: AC
  Filled 2015-09-01: qty 20

## 2015-09-01 MED ORDER — IOHEXOL 300 MG/ML  SOLN: 150.0000 mL | Freq: Once | INTRAMUSCULAR | Status: DC | PRN

## 2015-09-01 NOTE — Care Management Important Message (Signed)
Important Message  Patient Details  Name: [name] [name] MRN: [id number] Date of Birth: 05/16/1956   Medicare Important Message Given:  Yes    Raneen Jaffer, Antony Haste, RN 09/01/2015, 1:51 PM

## 2015-09-01 NOTE — Care Management Note (Signed)
Case Management Note  Patient Details  Name: AHLANI WEICHERT MRN: [id number] Date of Birth: 1956-03-05  Subjective/Objective:     60 y.o. F admitted 08/19/2015 with recurrent C Diff who has completed 10 days of tx for same. Deconditioned from at least 6 stools/day. ESRD with last HD on Wednesday after Thrombolysis of HD fistula in IR.     Action/Plan: Anticipate discharge to SNF on Thursday.  CSW following for discharge/Disposition.  No further CM needs but will be available should additional discharge needs arise.              Expected Discharge Date:                  Expected Discharge Plan:  Skilled Nursing Facility  In-House Referral:  Clinical Social Work  Discharge planning Services  CM Consult  Post Acute Care Choice:    Choice offered to:     DME Arranged:    DME Agency:     HH Arranged:    [place] Agency:     Status of Service:  Completed, signed off  Medicare Important Message Given:  Yes Date Medicare IM Given:    Medicare IM give by:    Date Additional Medicare IM Given:    Additional Medicare Important Message give by:     If discussed at [place] of Stay Meetings, dates discussed:    Additional Comments:  Delrae Sawyers, RN 09/01/2015, 2:40 PM

## 2015-09-01 NOTE — Procedures (Signed)
Successful LUE AVG DECLOT , LYSIS, AND 6MM PTA No comp Stable Ready for use Full report in PACS

## 2015-09-01 NOTE — Sedation Documentation (Signed)
Patient is resting comfortably. 

## 2015-09-01 NOTE — Progress Notes (Signed)
   09/01/15 1125  PT Visit Information  Last PT Received On 09/01/15  Assistance Needed +1  Reason Eval/Treat Not Completed Patient at procedure or test/unavailable (Pt off unit for thrombectomy will cancel intervention today. )  History of Present Illness 61 y.o. female admitted for weakness, nausea, and chronic diarrhea. 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    Governor Rooks, PTA pager [phone]

## 2015-09-01 NOTE — Progress Notes (Signed)
Nutrition Follow-up  DOCUMENTATION CODES:   Severe malnutrition in context of chronic illness, Underweight  INTERVENTION:   -Continue Ensure Enlive po TID, each supplement provides 350 kcal and 20 grams of protein -Consider initiation of nutrition support, if poor po intake continues  NUTRITION DIAGNOSIS:   Malnutrition related to chronic illness as evidenced by severe depletion of muscle mass, severe depletion of body fat.  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 She was recently admitted on 08/05/15 for C. Diff diarrhea and Influenza A. Patient states she has been taking vancomycin regularly since her discharge on 08/07/15 and finished the course of antibiotic 2-3 days ago. States she did not have any improvement in her diarrhea despite being compliant with the medication and still continues to have severe diarrhea which she describes as brown in color and mixed with white mucus-like material. Denies having any bloody diarrhea. Reports having mild occasional lower abdominal pain. States she does not have any nausea/ vomiting and continues to have adequate oral intake. However, does report feeling tired. States she was due for dialysis yesterday but could not go since she woke up covered in feces.   Pt headed to radiology for declotting of AVF at time of visit. Staff report that pt will go to HD after procedure.   Pt is currently NPO. Previously pt was receiving a regular diet; meal completion 0-50%. Pt has also  been receiving Ensure Enlive supplements, however, pt has been refusing over the past few days.   Staff report pt has been feeling better. She continues to have 5-6 loose stools daily.   Per RNCM note, plan to d/c SNF on 09/02/15.   Labs reviewed.   Diet Order:  Diet NPO time specified  Skin:  Reviewed, no issues (epithelialized stage II sacrum)  Last BM:  08/31/15  Height:   Ht Readings from Last 1 Encounters:  08/19/15 5\' 1"  (1.549 m)    Weight:   Wt Readings from Last 1 Encounters:  09/01/15 76 lb 3.2 oz (34.564 kg)    Ideal Body Weight:  47.7 kg  BMI:  Body mass index is 14.41 kg/(m^2).  Estimated Nutritional Needs:   Kcal:  1300-1500  Protein:  60-75 grams  Fluid:  >1.3 L  EDUCATION NEEDS:   Education needs addressed  Cambren Helm A. Jimmye Norman, RD, LDN, CDE Pager: [phone] After hours Pager: [phone]

## 2015-09-01 NOTE — Progress Notes (Signed)
Subjective:  Per IR  For declotting within the hour then HD today on schedule MWF/Reports diarrhea  Improving   Objective Vital signs in last 24 hours: Filed Vitals:   08/31/15 0522 08/31/15 1445 08/31/15 2335 09/01/15 0546  BP: 127/78 124/87 127/80 132/83  Pulse: 60 81 67 65  Temp: 97.4 F (36.3 C) 98 F (36.7 C) 97.3 F (36.3 C) 97.8 F (36.6 C)  TempSrc: Oral Oral Oral Oral  Resp: 16 20 20 20   Height:      Weight: 35.2 kg (77 lb 9.6 oz)   34.564 kg (76 lb 3.2 oz)  SpO2: 100% 100% 100% 100%   Weight change: -3.636 kg (-8 lb 0.3 oz)  Physical Exam: [id number], cachectic AAF in NAD CVS: RRR, no mur  Or  rub Resp:cta bilat  Abd: soft , NT, ND Ext:no  Pedal edema, LUE AVG without thrill or bruit    OP Dialysis Orders: [facility] MWF 3.5 hr 160 350/A 1.5 EDW 35.5 4 K 2.25 Ca no profile heparin 2000 Mircera 200 q 2 wks -to be given 3/1 calcitriol 1.25 left upper AVGG Recent labs: Hgb 8.8 at hospital d/c Fe 86 no tsat ferritin 1684 iPTH 298 Ca 7.2P 1.7 Alb 1.22  Problem/Plan 1. Recurrent C. Diff-  Admit team RX ( 2. ESRD: K4.2  due to clotted AVG for declot today and plan for HD after  MWF schedule 3. 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 Nutrition: severe protein and caloric malnutrition 6. Hypertension bp 127/80  And :stable 7. Thrombocytopenia- stable, off azathioprine 8. Cirrhosis= "last drank ETOH about 2 or 3 months ago" per DW Pt this am  9. Disposition-  FOR SNHP at DC  And has poor overall prognosis, recommend Palliative care consult to help set goals/limits of care given her multiple end-stage disease processes   Ernest Haber, PA-C [facility] Beeper [phone] 09/01/2015,9:51 AM  LOS: 13 days   Labs: Basic Metabolic Panel:  Recent Labs Lab 08/30/15 0500 08/31/15 0430 09/01/15 0450  NA 130* 134* 137  K 3.4* 3.5 4.2  CL 97* 102 104  CO2 22 25 24   GLUCOSE 87 71 64*  BUN 7 6 <5*  CREATININE 4.41* 4.09* 4.70*   CALCIUM 7.8* 7.8* 7.9*  PHOS 2.2*  --  2.9   Liver Function Tests:  Recent Labs Lab 08/30/15 1415 08/31/15 0430 09/01/15 0450  AST 43* 41  --   ALT 11* 13*  --   ALKPHOS 79 79  --   BILITOT 4.5* 4.3*  --   PROT 6.9 6.5  --   ALBUMIN <1.0* <1.0* <1.0*   No results for input(s): LIPASE, AMYLASE in the last 168 hours. No results for input(s): AMMONIA in the last 168 hours. CBC:  Recent Labs Lab 08/27/15 0741 08/29/15 0545 08/30/15 0500 08/31/15 0430 09/01/15 0500  WBC 4.6 6.2 6.1 6.6 6.4  HGB 10.7* 11.2* 10.8* 10.5* 9.9*  HCT 32.4* 32.8* 32.8* 32.7* 30.6*  MCV 96.1 99.1 99.4 99.7 99.4  PLT 30* 28* 34* 33* 46*   Cardiac Enzymes: No results for input(s): CKTOTAL, CKMB, CKMBINDEX, TROPONINI in the last 168 hours. CBG:  Recent Labs Lab 08/25/15 2106 08/27/15 0842 08/27/15 0903 08/28/15 0609 08/28/15 0647  GLUCAP 82 41* 78 67 88    Studies/Results: No results found. Medications:   . alteplase  4 mg Intracatheter Once  . feeding supplement (ENSURE ENLIVE)  237 mL Oral TID WC  .  fidaxomicin  200 mg Oral BID  . FLUoxetine  10 mg Oral Daily  . folic acid  1 mg Oral Daily  . hydroxychloroquine  400 mg Oral Daily  . lactobacillus  1 g Oral TID  . levETIRAcetam  250 mg Oral Daily  . levETIRAcetam  500 mg Oral Q M,W,F-1800  . lipase/protease/amylase  12,000 Units Oral TID AC  . mirtazapine  7.5 mg Oral QHS  . multivitamin  1 tablet Oral QHS  . nystatin  5 mL Oral QID  . pantoprazole  40 mg Oral Daily  . sodium chloride flush  10-40 mL Intracatheter Q12H  . thiamine  100 mg Oral Daily      I have seen and examined this patient and agree with plan as outlined by Ernest Haber, PA-C.  Doing well post declot, +T/B. Governor Rooks Shatoya Roets,MD 09/01/2015 2:41 PM

## 2015-09-01 NOTE — Progress Notes (Signed)
[name], female   DOB: 1955-10-15, 60 y.o.   MRN: [id number] Medicine attending: I examined this patient this morning together with resident physician Dr. Zada Finders and I concur with his evaluation and management plan which we discussed together. She is subjectively improving but still having frequent loose bowel movements. She completed a ten-day course of Dificid. She remains afebrile.  Interventional radiology was successfully able to reestablish flow in her AV fistula this afternoon. She will proceed with dialysis today. Anticipate discharge to SNF on Thursday.

## 2015-09-01 NOTE — Progress Notes (Signed)
   Subjective: Patient reports she is feeling much better today, "in every way." She does still have 5-6 stools per day. She had a self-limited nosebleed overnight. She is to have declotting of her AVF by IR today prior to dialysis.  Objective: Vital signs in last 24 hours: Filed Vitals:   08/31/15 0522 08/31/15 1445 08/31/15 2335 09/01/15 0546  BP: 127/78 124/87 127/80 132/83  Pulse: 60 81 67 65  Temp: 97.4 F (36.3 C) 98 F (36.7 C) 97.3 F (36.3 C) 97.8 F (36.6 C)  TempSrc: Oral Oral Oral Oral  Resp: 16 20 20 20   Height:      Weight: 77 lb 9.6 oz (35.2 kg)   76 lb 3.2 oz (34.564 kg)  SpO2: 100% 100% 100% 100%   Weight change: -8 lb 0.3 oz (-3.636 kg)  Intake/Output Summary (Last 24 hours) at 09/01/15 1026 Last data filed at 09/01/15 0600  Gross per 24 hour  Intake    370 ml  Output      0 ml  Net    370 ml   General: cachectic woman, sitting up in bed, no acute distress Cardiac: RRR, systolic murmur heard best at LUS border Pulm: clear to auscultation bilaterally Abd: soft, nontender, nondistended Ext: thin extremities, no pedal edema    Assessment/Plan: 60 year old woman with ESRD with lupus nephritis who presented with persistent c. diff diarrhea after recently completing PO vanc course.   Persistent C. Difficile Diarrhea: Patient with continued loose semi-formed stool. PT recommends SNF due to increased weakness and assistance needs. 10 day course of Dificid completed on 08/31/15. Possible candidate for fecal transplant.. -Completed Dificid 200 mg po BID for 10 days (3/19>>3/28) -Outpatient ID appointment - 09/08/2015 -Continue lactobacillus 1 g TID  Persistent fever: Afebrile 4 days. Cultures so far have been negative. 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 She has had intermittent episodes of epistaxis, self-limited nosebleed overnight. Her lower than baseline thrombocytopenia may be related to the Vancomycin, which we have discontinued. She has also been on immunosuppressants for her lupus which can cause thrombocytopenia.  -Holding Azathioprine  -Monitor for bleeding  ESRD on MWF HD: On regular HD schedule. AVF malfunctioned with possible clotting off. IR to attempt declot today versus temporary cath/tunneled cath with subsequent HD today. -HD per nephrology -Will need usable HD access prior to discharge  SLE: Less likely to have lupus flare as dsDNA is not elevated. -Continue Plaquenil 400 mg daily  -Holding Azathiopine 50 mg daily in setting of severe thrombocytopenia  Chronic pancreatitis -Continue Creon 12,000 units TID  Depression -Continue Prozac 10 mg daily -Continue Mirtazapine 7.5 mg daily    Dispo:  Anticipated discharge to SNF when viable HD access established.    LOS: 13 days   Zada Finders, MD 09/01/2015, 10:26 AM

## 2015-09-02 DIAGNOSIS — K921 Melena: Secondary | ICD-10-CM | POA: Diagnosis not present

## 2015-09-02 DIAGNOSIS — R040 Epistaxis: Secondary | ICD-10-CM | POA: Diagnosis not present

## 2015-09-02 DIAGNOSIS — I517 Cardiomegaly: Secondary | ICD-10-CM | POA: Diagnosis not present

## 2015-09-02 DIAGNOSIS — Z881 Allergy status to other antibiotic agents status: Secondary | ICD-10-CM | POA: Diagnosis not present

## 2015-09-02 DIAGNOSIS — Z87891 Personal history of nicotine dependence: Secondary | ICD-10-CM | POA: Diagnosis not present

## 2015-09-02 DIAGNOSIS — E46 Unspecified protein-calorie malnutrition: Secondary | ICD-10-CM | POA: Diagnosis not present

## 2015-09-02 DIAGNOSIS — M329 Systemic lupus erythematosus, unspecified: Secondary | ICD-10-CM | POA: Diagnosis not present

## 2015-09-02 DIAGNOSIS — I5030 Unspecified diastolic (congestive) heart failure: Secondary | ICD-10-CM | POA: Diagnosis not present

## 2015-09-02 DIAGNOSIS — E162 Hypoglycemia, unspecified: Secondary | ICD-10-CM | POA: Diagnosis not present

## 2015-09-02 DIAGNOSIS — R64 Cachexia: Secondary | ICD-10-CM | POA: Diagnosis not present

## 2015-09-02 DIAGNOSIS — F329 Major depressive disorder, single episode, unspecified: Secondary | ICD-10-CM | POA: Diagnosis not present

## 2015-09-02 DIAGNOSIS — K219 Gastro-esophageal reflux disease without esophagitis: Secondary | ICD-10-CM | POA: Diagnosis not present

## 2015-09-02 DIAGNOSIS — Z79899 Other long term (current) drug therapy: Secondary | ICD-10-CM | POA: Diagnosis not present

## 2015-09-02 DIAGNOSIS — F445 Conversion disorder with seizures or convulsions: Secondary | ICD-10-CM | POA: Diagnosis not present

## 2015-09-02 DIAGNOSIS — R627 Adult failure to thrive: Secondary | ICD-10-CM | POA: Diagnosis not present

## 2015-09-02 DIAGNOSIS — D696 Thrombocytopenia, unspecified: Secondary | ICD-10-CM | POA: Diagnosis not present

## 2015-09-02 DIAGNOSIS — E781 Pure hyperglyceridemia: Secondary | ICD-10-CM | POA: Diagnosis not present

## 2015-09-02 DIAGNOSIS — D631 Anemia in chronic kidney disease: Secondary | ICD-10-CM | POA: Diagnosis present

## 2015-09-02 DIAGNOSIS — K7030 Alcoholic cirrhosis of liver without ascites: Secondary | ICD-10-CM | POA: Diagnosis not present

## 2015-09-02 DIAGNOSIS — M6281 Muscle weakness (generalized): Secondary | ICD-10-CM | POA: Diagnosis not present

## 2015-09-02 DIAGNOSIS — N2581 Secondary hyperparathyroidism of renal origin: Secondary | ICD-10-CM | POA: Diagnosis not present

## 2015-09-02 DIAGNOSIS — G40909 Epilepsy, unspecified, not intractable, without status epilepticus: Secondary | ICD-10-CM | POA: Diagnosis not present

## 2015-09-02 DIAGNOSIS — D649 Anemia, unspecified: Secondary | ICD-10-CM | POA: Diagnosis not present

## 2015-09-02 DIAGNOSIS — I132 Hypertensive heart and chronic kidney disease with heart failure and with stage 5 chronic kidney disease, or end stage renal disease: Secondary | ICD-10-CM | POA: Diagnosis not present

## 2015-09-02 DIAGNOSIS — F339 Major depressive disorder, recurrent, unspecified: Secondary | ICD-10-CM | POA: Diagnosis not present

## 2015-09-02 DIAGNOSIS — L8990 Pressure ulcer of unspecified site, unspecified stage: Secondary | ICD-10-CM | POA: Diagnosis not present

## 2015-09-02 DIAGNOSIS — D688 Other specified coagulation defects: Secondary | ICD-10-CM | POA: Diagnosis not present

## 2015-09-02 DIAGNOSIS — K861 Other chronic pancreatitis: Secondary | ICD-10-CM | POA: Diagnosis not present

## 2015-09-02 DIAGNOSIS — E161 Other hypoglycemia: Secondary | ICD-10-CM | POA: Diagnosis not present

## 2015-09-02 DIAGNOSIS — K7290 Hepatic failure, unspecified without coma: Secondary | ICD-10-CM | POA: Diagnosis not present

## 2015-09-02 DIAGNOSIS — Y95 Nosocomial condition: Secondary | ICD-10-CM | POA: Diagnosis present

## 2015-09-02 DIAGNOSIS — E43 Unspecified severe protein-calorie malnutrition: Secondary | ICD-10-CM | POA: Diagnosis not present

## 2015-09-02 DIAGNOSIS — N186 End stage renal disease: Secondary | ICD-10-CM | POA: Diagnosis not present

## 2015-09-02 DIAGNOSIS — Z681 Body mass index (BMI) 19 or less, adult: Secondary | ICD-10-CM | POA: Diagnosis not present

## 2015-09-02 DIAGNOSIS — L932 Other local lupus erythematosus: Secondary | ICD-10-CM | POA: Diagnosis not present

## 2015-09-02 DIAGNOSIS — J189 Pneumonia, unspecified organism: Secondary | ICD-10-CM | POA: Diagnosis not present

## 2015-09-02 DIAGNOSIS — N183 Chronic kidney disease, stage 3 (moderate): Secondary | ICD-10-CM | POA: Diagnosis not present

## 2015-09-02 DIAGNOSIS — Z888 Allergy status to other drugs, medicaments and biological substances status: Secondary | ICD-10-CM | POA: Diagnosis not present

## 2015-09-02 DIAGNOSIS — Z992 Dependence on renal dialysis: Secondary | ICD-10-CM | POA: Diagnosis not present

## 2015-09-02 DIAGNOSIS — N031 Chronic nephritic syndrome with focal and segmental glomerular lesions: Secondary | ICD-10-CM | POA: Diagnosis not present

## 2015-09-02 DIAGNOSIS — A047 Enterocolitis due to Clostridium difficile: Secondary | ICD-10-CM | POA: Diagnosis not present

## 2015-09-02 DIAGNOSIS — K860 Alcohol-induced chronic pancreatitis: Secondary | ICD-10-CM | POA: Diagnosis not present

## 2015-09-02 DIAGNOSIS — R7309 Other abnormal glucose: Secondary | ICD-10-CM | POA: Diagnosis not present

## 2015-09-02 DIAGNOSIS — R54 Age-related physical debility: Secondary | ICD-10-CM | POA: Diagnosis not present

## 2015-09-02 DIAGNOSIS — I120 Hypertensive chronic kidney disease with stage 5 chronic kidney disease or end stage renal disease: Secondary | ICD-10-CM | POA: Diagnosis not present

## 2015-09-02 DIAGNOSIS — Z515 Encounter for palliative care: Secondary | ICD-10-CM | POA: Diagnosis not present

## 2015-09-02 DIAGNOSIS — G9340 Encephalopathy, unspecified: Secondary | ICD-10-CM | POA: Diagnosis not present

## 2015-09-02 DIAGNOSIS — Z66 Do not resuscitate: Secondary | ICD-10-CM | POA: Diagnosis not present

## 2015-09-02 DIAGNOSIS — K7040 Alcoholic hepatic failure without coma: Secondary | ICD-10-CM | POA: Diagnosis not present

## 2015-09-02 LAB — BASIC METABOLIC PANEL
ANION GAP: 8 (ref 5–15)
BUN: <5 mg/dL — ABNORMAL LOW (ref 6–20)
CALCIUM: 7.8 mg/dL — AB (ref 8.9–10.3)
CO2: 23 mmol/L (ref 22–32)
CREATININE: 3.82 mg/dL — AB (ref 0.44–1.00)
Chloride: 105 mmol/L (ref 101–111)
GFR calc Af Amer: 14 mL/min — ABNORMAL LOW (ref >60)
GFR calc non Af Amer: 12 mL/min — ABNORMAL LOW (ref >60)
GLUCOSE: 121 mg/dL — AB (ref 65–99)
Potassium: 4 mmol/L (ref 3.5–5.1)
Sodium: 136 mmol/L (ref 135–145)

## 2015-09-02 LAB — CBC
HEMATOCRIT: 29.5 % — AB (ref 36.0–46.0)
Hemoglobin: 9.4 g/dL — ABNORMAL LOW (ref 12.0–15.0)
MCH: 32.1 pg (ref 26.0–34.0)
MCHC: 31.9 g/dL (ref 30.0–36.0)
MCV: 100.7 fL — AB (ref 78.0–100.0)
Platelets: 41 10*3/uL — ABNORMAL LOW (ref 150–400)
RBC: 2.93 MIL/uL — ABNORMAL LOW (ref 3.87–5.11)
RDW: 23 % — AB (ref 11.5–15.5)
WBC: 9 10*3/uL (ref 4.0–10.5)

## 2015-09-02 MED ORDER — ENSURE ENLIVE PO LIQD: 237.0000 mL | Freq: Three times a day (TID) | ORAL | Status: AC

## 2015-09-02 MED FILL — Alteplase For Inj 2 MG: INTRAMUSCULAR | Qty: 4 | Status: AC

## 2015-09-02 NOTE — Clinical Social Work Placement (Signed)
   CLINICAL SOCIAL WORK PLACEMENT  NOTE  Date:  09/02/2015  Patient Details  Name: [name] [name] MRN: [id number] Date of Birth: 1956/04/09  Clinical Social Work is seeking post-discharge placement for this patient at the [place] level of care (*CSW will initial, date and re-position this form in  chart as items are completed):  Yes   Patient/family provided with [facility] Work Department's list of facilities offering this level of care within the geographic area requested by the patient (or if unable, by the patient's family).  Yes   Patient/family informed of their freedom to choose among providers that offer the needed level of care, that participate in Medicare, Medicaid or managed care program needed by the patient, have an available bed and are willing to accept the patient.  Yes   Patient/family informed of [facility]'s ownership interest in [facility] and [facility], as well as of the fact that they are under no obligation to receive care at these facilities.  PASRR submitted to EDS on 08/23/15     PASRR number received on 08/23/15     Existing PASRR number confirmed on       FL2 transmitted to all facilities in geographic area requested by pt/family on 08/23/15     FL2 transmitted to all facilities within larger geographic area on       Patient informed that his/her managed care company has contracts with or will negotiate with certain facilities, including the following:        Yes   Patient/family informed of bed offers received.  Patient chooses bed at [facility]     Physician recommends and patient chooses bed at      Patient to be transferred to [facility] on 09/02/15.  Patient to be transferred to facility by [facility]     Patient family notified on 09/02/15 of transfer.  Name of family member notified:  Daughter     PHYSICIAN       Additional Comment:     _______________________________________________ Benard Halsted, [place] 09/02/2015, 12:03 PM

## 2015-09-02 NOTE — Progress Notes (Signed)
Patient will DC to: [facility] Anticipated DC date: 09/02/15 Family notified: Daughter Transport by: [facility] 1pm  CSW signing off.  Cedric Fishman, [place] Social Worker [phone]

## 2015-09-02 NOTE — Progress Notes (Signed)
Per MD order, central line removed. IV cathter intact. Vaseline pressure gauze to site, pressure held x 2 min, no bleeding to site. Pt instructed not to get out of bed for 30 min after the removal of the central line. Instucted to keep dressing CDI x 24hours. [employer]

## 2015-09-02 NOTE — Progress Notes (Signed)
Nsg Discharge Note  Admit Date:  08/19/2015 Discharge date: 09/02/2015   [name] [name] to be D/C'd Skilled nursing facility per MD order.  AVS completed.  Copy for chart, and copy for patient signed, and dated. Patient/caregiver able to verbalize understanding.  Discharge Medication:   Medication List    STOP taking these medications        azaTHIOprine 50 MG tablet  Commonly known as:  IMURAN     vancomycin 50 mg/mL oral solution  Commonly known as:  VANCOCIN      TAKE these medications        ACIDOPHILUS PROBIOTIC PO  Take 2 mg by mouth 3 (three) times daily. Take 2 (two) tablets by mouth three times daily     albuterol 108 (90 Base) MCG/ACT inhaler  Commonly known as:  PROAIR HFA  Inhale 1-2 puffs into the lungs every 6 (six) hours as needed for wheezing or shortness of breath.     calcium carbonate 500 MG chewable tablet  Commonly known as:  TUMS - dosed in mg elemental calcium  Chew 1,000 tablets by mouth at bedtime.     camphor-menthol lotion  Commonly known as:  SARNA  Apply 1 application topically daily as needed for itching.     cetirizine 10 MG tablet  Commonly known as:  ZYRTEC  Take 1 tablet (10 mg total) by mouth daily as needed for allergies.     CREON 12000 units Cpep capsule  Generic drug:  lipase/protease/amylase  Take 1 capsule (12,000 Units total) by mouth 3 (three) times daily before meals.     Darbepoetin Alfa 150 MCG/0.3ML Sosy injection  Commonly known as:  ARANESP  Inject 0.3 mLs (150 mcg total) into the vein every Wednesday with hemodialysis.     feeding supplement (ENSURE ENLIVE) Liqd  Take 237 mLs by mouth 3 (three) times daily with meals.     FLUoxetine 10 MG capsule  Commonly known as:  PROZAC  Take 1 capsule (10 mg total) by mouth daily. For depression     folic acid 1 MG tablet  Commonly known as:  FOLVITE  Take 1 tablet (1 mg total) by mouth daily. For folic acid replacement     gabapentin 300 MG capsule  Commonly known  as:  NEURONTIN  Take 1 capsule (300 mg total) by mouth daily.     hydroxychloroquine 200 MG tablet  Commonly known as:  PLAQUENIL  Take 400 mg by mouth daily.     levETIRAcetam 250 MG tablet  Commonly known as:  KEPPRA  Take 1 tablet (250 mg total) by mouth daily. Take additional 500mg  (2 tablets) after Dialysis treatments on Monday, Wednesday, Friday     mirtazapine 7.5 MG tablet  Commonly known as:  REMERON  Take 1 tablet (7.5 mg total) by mouth at bedtime.     multivitamin with minerals Tabs tablet  Take 1 tablet by mouth daily. For vitamin replacement     omeprazole 40 MG capsule  Commonly known as:  PRILOSEC  Take 1 capsule (40 mg total) by mouth daily.     thiamine 100 MG tablet  Commonly known as:  VITAMIN B-1  Take 1 tablet (100 mg total) by mouth daily. For low thiamine     vitamin B-12 250 MCG tablet  Commonly known as:  CYANOCOBALAMIN  Take 1 tablet (250 mcg total) by mouth every evening.        Discharge Assessment: Filed Vitals:   09/01/15 2210 09/02/15 0507  BP: 96/54 103/56  Pulse:  66  Temp:  97.4 F (36.3 C)  Resp:  16   Skin clean, dry and intact without evidence of skin break down, no evidence of skin tears noted. IV catheter discontinued intact. Site without signs and symptoms of complications - no redness or edema noted at insertion site, patient denies c/o pain - only slight tenderness at site.  Dressing with slight pressure applied.  D/c Instructions-Education: Discharge instructions given to patient/family with verbalized understanding. D/c education completed with patient/family including follow up instructions, medication list, d/c activities limitations if indicated, with other d/c instructions as indicated by MD - patient able to verbalize understanding, all questions fully answered. Patient instructed to return to ED, call 911, or call MD for any changes in condition.  Patient escorted via EMS, Report called into [place].  Dayle Points, RN 09/02/2015 1:38 PM

## 2015-09-02 NOTE — Progress Notes (Signed)
[name], female   DOB: 04/29/1956, 60 y.o.   MRN: [id number] Medicine attending discharge note: I personally examined this patient on the day of discharge and I attest to the accuracy of the evaluation and discharge plan as recorded in the progress note and discharge summary dated 09/02/2015 by resident physician Dr. Zada Finders.  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 Her internist diagnosed her with systemic lupus 1 year ago when she developed joint symptoms, and pleuritis. She had high anti-double-stranded DNA titers. She was evaluated by rheumatology and started on a regimen of Plaquenil and azathioprine. Additional medical problems include chronic diastolic heart failure, chronic pancytopenia, cirrhosis felt to be secondary to prior heavy alcohol use, cirrhosis related decreased synthetic hepatic function with coagulopathy, elevated bilirubin, and decreased albumin. Chronic thrombocytopenia. She was initially admitted on February 24 for diarrhea. C. difficile positive. Started on oral vancomycin with initial improvement and discharged on February 27. She completed a 10 day course but had persistent diarrhea. She was readmitted on March 16.  Hospital course: Stool persistently positive for C. difficile but negative for other GI pathogens. After consultation with infectious disease, she was put back on oral vancomycin. A number of significant laboratory abnormalities were noted on admission including hypokalemia, potassium 3.1, hypoalbuminemia with albumin 1.3 g percent, acute on chronic rise in her total bilirubin from 1.5 on March 3 to 6.0, initial white count 7200 increased from recent baseline of four thousand. Initial platelet count 44,000, repeat 51,000.  On the third hospital day hemoglobin fell to 7.3 g and platelet count fell to 19,000. She developed fever to 103.1.  Platelet count fell further to 15,000 by the fourth hospital day and white count fell to 3700. Drug reaction was suspected. Vancomycin was discontinued. Azathioprine was discontinued. A 10 day course of Dificid was initiated. She continued to have intermittent culture-negative fever. No infiltrate or effusion on chest x-ray. She was started on Zosyn. She was only treated for 3 days. Fevers resolved. Review of the peripheral blood film did not reveal a microangiopathic process. Hemoglobin fell further to 6.6 g requiring blood transfusion. Platelet count slowly improved back to previous baseline and was 41,000 on March 30. White blood count recovered to normal and was 9000 at discharge. Hemoglobin remained overall stable for someone with end-stage renal disease and was 9.4 at discharge. She improved clinically on the Dificid and off the vancomycin. She continued to have about 4 semi-formed stools daily and we attributed some of this to her chronic pancreatitis.  She developed decrease flow through her AV fistula Roxanol left arm. She was successfully treated with thrombolytic therapy on March 29 and flow was reestablished.  Liver functions improved but not back to her baseline. Bilirubin on March 28, 4.3, alkaline phosphatase 79, SGOT 41, SGPT 13. Albumin less than 1 g percent. CT of the abdomen and pelvis done to rule out abscess showed a nodular contour to the liver without any focal hepatic mass or intrahepatic biliary duct dilatation. Calcified gallstones noted. Small amount of peri-hepatic ascites. Multiple pancreatic calcifications and dilatation of the main pancreatic duct consistent with chronic calcific pancreatitis. No focal mass. Mild Thickening of the descending colon and proximal sigmoid colon consistent with resolving colitis. No pericolonic abscess. Changes from previous right hemicolectomy. Moderate pelvic ascites.  Disposition: Condition stable enough for discharge. She is severely  malnourished and deconditioned. Appetite is good and some of her hypoalbuminemia  is related to hepatic synthetic dysfunction. She will be discharged directly to an extended care facility. She will follow-up in our general medical clinic, with nephrology, and rheumatology. Complications: Temporary occlusion of the venous limb of her AV fistula resolved with local thrombolytic therapy.

## 2015-09-02 NOTE — Progress Notes (Signed)
   Subjective: Patient continues report that she is feeling better. She says she had about 4 bowel movements yesterday, which she says are beginning to "thicken up." She denies any abdominal pain or bleeding. IR was able to successfully declot her AVF and she went for HD yesterday.  Objective: Vital signs in last 24 hours: Filed Vitals:   09/01/15 1718 09/01/15 2132 09/01/15 2210 09/02/15 0507  BP: 113/54 85/42 96/54  103/56  Pulse: 104 89  66  Temp:  98.7 F (37.1 C)  97.4 F (36.3 C)  TempSrc:  Oral  Oral  Resp: 18 18  16   Height:      Weight:    80 lb 14.5 oz (36.7 kg)  SpO2:  98%  100%   Weight change: 1.3 oz (0.036 kg)  Intake/Output Summary (Last 24 hours) at 09/02/15 1049 Last data filed at 09/02/15 0600  Gross per 24 hour  Intake    720 ml  Output    500 ml  Net    220 ml   General: cachectic woman, resting in bed, no acute distress Cardiac: RRR, systolic murmur heard best at LUS border Pulm: mild basilar crackles Abd: soft, nontender, nondistended Ext: thin extremities, no pedal edema    Assessment/Plan: 60 year old woman with ESRD with lupus nephritis who presented with persistent c. diff diarrhea after recently completing PO vanc course.   Persistent C. Difficile Diarrhea: Patient with subjective improvement over the last several days. Has 4-5 bowel movements per day, but these are becoming more formed. 10 day course of Dificid completed on 08/31/15. Possible candidate for fecal transplant.. -Completed Dificid 200 mg po BID for 10 days (3/19>>3/28) -Outpatient ID appointment - 09/08/2015 -Continue lactobacillus 1 g TID  Persistent fever: Afebrile 5 days. Cultures so far have been negative. -Off IV Zosyn (3/23 >> 3/26 )  -f/u repeat urine/blood cultures >> NGTD -CT abdomen/pelvis without abscess, showed some improvement of colitis -remove PICC prior to discharge  Severe thrombocytopenia 2/2 liver disease: She has chronic thrombocytopenia, with platelets lowest  at 15-16K during admission, 46K this morning. She has had intermittent episodes of epistaxis, self-limited nosebleed overnight. Her lower than baseline thrombocytopenia may be related to the Vancomycin, which we have discontinued. She has also been on immunosuppressants for her lupus which can cause thrombocytopenia.  -Holding Azathioprine  -Monitor for bleeding  ESRD on MWF HD: On regular HD schedule. IR was able to successfully declot her AVF and she went for HD yesterday.  -HD per nephrology -Will need HD transportation from SNF, resume regular schedule tomorrow   SLE: Less likely to have lupus flare as dsDNA is not elevated. -Continue Plaquenil 400 mg daily  -Holding Azathiopine 50 mg daily in setting of severe thrombocytopenia  Chronic pancreatitis -Continue Creon 12,000 units TID  Depression -Continue Prozac 10 mg daily -Continue Mirtazapine 7.5 mg daily    Dispo:  Anticipated discharge to SNF today.   LOS: 14 days   Zada Finders, MD 09/02/2015, 10:49 AM

## 2015-09-02 NOTE — Discharge Instructions (Signed)
Please continue your medications as prescribed. We are transferring you to a skilled nursing facility to help gain your strength back.  Please continue to go to your dialysis sessions as scheduled per your kidney doctors. Please also follow up with the Infectious Disease doctors. We have made an appointment for you.

## 2015-09-02 NOTE — Progress Notes (Signed)
[name], female   DOB: Apr 19, 1956, 60 y.o.   MRN: [id number]  [facility] Progress Note    Subjective:   No new complaints today   Objective:   BP 103/56 mmHg  Pulse 66  Temp(Src) 97.4 F (36.3 C) (Oral)  Resp 16  Ht 5\' 1"  (1.549 m)  Wt 36.7 kg (80 lb 14.5 oz)  BMI 15.30 kg/m2  SpO2 100%  Intake/Output: I/O last 3 completed shifts: In: 1090 [P.O.:960; I.V.:10; Other:120] Out: 500 [Other:500]   Intake/Output this shift:    Weight change: 0.036 kg (1.3 oz)  Physical Exam: [id number] AAF in NAD CVS:no rub Resp:cta Abd:+BS, soft Ext:no edema, LUE AVG+T/B  Labs: BMET  Recent Labs Lab 08/27/15 0741 08/30/15 0500 08/30/15 1415 08/31/15 0430 09/01/15 0450 09/02/15 0545  NA 134* 130*  --  134* 137 136  K 3.6 3.4*  --  3.5 4.2 4.0  CL 99* 97*  --  102 104 105  CO2 27 22  --  25 24 23   GLUCOSE 61* 87  --  71 64* 121*  BUN 5* 7  --  6 <5* <5*  CREATININE 3.39* 4.41*  --  4.09* 4.70* 3.82*  ALBUMIN  --  <1.0* <1.0* <1.0* <1.0*  --   CALCIUM 7.9* 7.8*  --  7.8* 7.9* 7.8*  PHOS  --  2.2*  --   --  2.9  --    CBC  Recent Labs Lab 08/30/15 0500 08/31/15 0430 09/01/15 0500 09/02/15 0545  WBC 6.1 6.6 6.4 9.0  HGB 10.8* 10.5* 9.9* 9.4*  HCT 32.8* 32.7* 30.6* 29.5*  MCV 99.4 99.7 99.4 100.7*  PLT 34* 33* 46* 41*    @IMGRELPRIORS @ Medications:    . feeding supplement (ENSURE ENLIVE)  237 mL Oral TID WC  . FLUoxetine  10 mg Oral Daily  . folic acid  1 mg Oral Daily  . hydroxychloroquine  400 mg Oral Daily  . lactobacillus  1 g Oral TID  . levETIRAcetam  250 mg Oral Daily  . levETIRAcetam  500 mg Oral Q M,W,F-1800  . lipase/protease/amylase  12,000 Units Oral TID AC  . mirtazapine  7.5 mg Oral QHS  . multivitamin  1 tablet Oral QHS  . nystatin  5 mL Oral QID  . pantoprazole  40 mg Oral Daily  . sodium chloride flush  10-40 mL Intracatheter Q12H  . thiamine  100 mg Oral Daily   OP Dialysis Orders: [facility] M[facility] 3.5 hr  160 350/A 1.5 EDW 35.5 4 K 2.25 Ca no profile heparin 2000 Mircera 200 q 2 wks -to be given 3/1 calcitriol 1.25 left upper AVGG Recent labs: Hgb 8.8 at hospital d/c Fe 86 no tsat ferritin 1684 iPTH 298 Ca 7.2P 1.7 Alb 1.22   Assessment/ Plan:   1. Recurrent C. Diff- Admit team RX  2. ESRD: s/p declot and HD 09/01/15.  Will get back on MWF schedule tomorrow. 3. 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 Nutrition: severe protein and caloric malnutrition 6. Hypertension bp 127/80 And :stable 7. Thrombocytopenia- stable, off azathioprine 8. Cirrhosis= "last drank ETOH about 2 or 3 months ago" per DW Pt this am  9. Disposition- FOR SNHP at DC And has poor overall prognosis, recommend Palliative care consult to help set goals/limits of care given her multiple end-stage disease processes  Donetta Potts, MD [place] Pager [phone] 09/02/2015, 9:35 AM

## 2015-09-03 DIAGNOSIS — Z992 Dependence on renal dialysis: Secondary | ICD-10-CM | POA: Diagnosis not present

## 2015-09-03 DIAGNOSIS — N186 End stage renal disease: Secondary | ICD-10-CM | POA: Diagnosis not present

## 2015-09-03 DIAGNOSIS — N031 Chronic nephritic syndrome with focal and segmental glomerular lesions: Secondary | ICD-10-CM | POA: Diagnosis not present

## 2015-09-04 DIAGNOSIS — N186 End stage renal disease: Secondary | ICD-10-CM | POA: Diagnosis not present

## 2015-09-04 DIAGNOSIS — D688 Other specified coagulation defects: Secondary | ICD-10-CM | POA: Diagnosis not present

## 2015-09-06 ENCOUNTER — Ambulatory Visit: Payer: Self-pay | Admitting: *Deleted

## 2015-09-06 ENCOUNTER — Other Ambulatory Visit: Payer: Self-pay | Admitting: *Deleted

## 2015-09-06 NOTE — Patient Outreach (Signed)
Call received from [place] at the business office of [employer] concerning eligibility of member as she was discharged to the facility for rehab last week.  Call placed back to [facility], voice message left.  Contact information provided for this care manager and N. Quentin Cornwall, care management assistant, for [facility] to call back with questions.  [name], BSN, [place] [facility]  [facility] Care Manager [phone]

## 2015-09-07 ENCOUNTER — Encounter (HOSPITAL_COMMUNITY): Payer: Self-pay | Admitting: *Deleted

## 2015-09-07 ENCOUNTER — Ambulatory Visit (INDEPENDENT_AMBULATORY_CARE_PROVIDER_SITE_OTHER): Payer: Medicare Other | Admitting: Internal Medicine

## 2015-09-07 ENCOUNTER — Ambulatory Visit: Payer: Self-pay | Admitting: *Deleted

## 2015-09-07 ENCOUNTER — Observation Stay (HOSPITAL_COMMUNITY): Payer: Medicare Other

## 2015-09-07 ENCOUNTER — Encounter: Payer: Self-pay | Admitting: Internal Medicine

## 2015-09-07 ENCOUNTER — Inpatient Hospital Stay (HOSPITAL_COMMUNITY)
Admission: EM | Admit: 2015-09-07 | Discharge: 2015-09-13 | DRG: 432 | Disposition: A | Discharge status: Skilled Nursing Facility | Payer: Medicare Other | Attending: Internal Medicine | Admitting: Internal Medicine

## 2015-09-07 VITALS — BP 123/83 | HR 73 | Temp 98.1°F | Wt 80.5 lb

## 2015-09-07 DIAGNOSIS — K921 Melena: Secondary | ICD-10-CM | POA: Diagnosis not present

## 2015-09-07 DIAGNOSIS — Z515 Encounter for palliative care: Secondary | ICD-10-CM | POA: Diagnosis present

## 2015-09-07 DIAGNOSIS — K7030 Alcoholic cirrhosis of liver without ascites: Secondary | ICD-10-CM | POA: Diagnosis present

## 2015-09-07 DIAGNOSIS — I5030 Unspecified diastolic (congestive) heart failure: Secondary | ICD-10-CM | POA: Diagnosis present

## 2015-09-07 DIAGNOSIS — Z681 Body mass index (BMI) 19 or less, adult: Secondary | ICD-10-CM | POA: Diagnosis not present

## 2015-09-07 DIAGNOSIS — N2581 Secondary hyperparathyroidism of renal origin: Secondary | ICD-10-CM | POA: Diagnosis not present

## 2015-09-07 DIAGNOSIS — Z992 Dependence on renal dialysis: Secondary | ICD-10-CM

## 2015-09-07 DIAGNOSIS — K219 Gastro-esophageal reflux disease without esophagitis: Secondary | ICD-10-CM | POA: Diagnosis present

## 2015-09-07 DIAGNOSIS — E781 Pure hyperglyceridemia: Secondary | ICD-10-CM | POA: Diagnosis not present

## 2015-09-07 DIAGNOSIS — A047 Enterocolitis due to Clostridium difficile: Secondary | ICD-10-CM

## 2015-09-07 DIAGNOSIS — R627 Adult failure to thrive: Secondary | ICD-10-CM | POA: Diagnosis not present

## 2015-09-07 DIAGNOSIS — Z66 Do not resuscitate: Secondary | ICD-10-CM | POA: Diagnosis not present

## 2015-09-07 DIAGNOSIS — N183 Chronic kidney disease, stage 3 (moderate): Secondary | ICD-10-CM | POA: Diagnosis not present

## 2015-09-07 DIAGNOSIS — K7290 Hepatic failure, unspecified without coma: Secondary | ICD-10-CM | POA: Diagnosis not present

## 2015-09-07 DIAGNOSIS — N186 End stage renal disease: Secondary | ICD-10-CM | POA: Diagnosis not present

## 2015-09-07 DIAGNOSIS — K861 Other chronic pancreatitis: Secondary | ICD-10-CM | POA: Diagnosis not present

## 2015-09-07 DIAGNOSIS — R54 Age-related physical debility: Secondary | ICD-10-CM | POA: Diagnosis not present

## 2015-09-07 DIAGNOSIS — G9340 Encephalopathy, unspecified: Secondary | ICD-10-CM | POA: Diagnosis not present

## 2015-09-07 DIAGNOSIS — J189 Pneumonia, unspecified organism: Secondary | ICD-10-CM | POA: Diagnosis present

## 2015-09-07 DIAGNOSIS — Y95 Nosocomial condition: Secondary | ICD-10-CM | POA: Diagnosis present

## 2015-09-07 DIAGNOSIS — K7040 Alcoholic hepatic failure without coma: Principal | ICD-10-CM | POA: Diagnosis present

## 2015-09-07 DIAGNOSIS — R64 Cachexia: Secondary | ICD-10-CM | POA: Diagnosis present

## 2015-09-07 DIAGNOSIS — F329 Major depressive disorder, single episode, unspecified: Secondary | ICD-10-CM | POA: Diagnosis not present

## 2015-09-07 DIAGNOSIS — D696 Thrombocytopenia, unspecified: Secondary | ICD-10-CM | POA: Diagnosis present

## 2015-09-07 DIAGNOSIS — E162 Hypoglycemia, unspecified: Secondary | ICD-10-CM | POA: Diagnosis present

## 2015-09-07 DIAGNOSIS — E161 Other hypoglycemia: Secondary | ICD-10-CM | POA: Diagnosis not present

## 2015-09-07 DIAGNOSIS — I132 Hypertensive heart and chronic kidney disease with heart failure and with stage 5 chronic kidney disease, or end stage renal disease: Secondary | ICD-10-CM | POA: Diagnosis not present

## 2015-09-07 DIAGNOSIS — D631 Anemia in chronic kidney disease: Secondary | ICD-10-CM | POA: Diagnosis not present

## 2015-09-07 DIAGNOSIS — I120 Hypertensive chronic kidney disease with stage 5 chronic kidney disease or end stage renal disease: Secondary | ICD-10-CM | POA: Diagnosis not present

## 2015-09-07 DIAGNOSIS — D688 Other specified coagulation defects: Secondary | ICD-10-CM | POA: Diagnosis not present

## 2015-09-07 DIAGNOSIS — Z881 Allergy status to other antibiotic agents status: Secondary | ICD-10-CM

## 2015-09-07 DIAGNOSIS — E43 Unspecified severe protein-calorie malnutrition: Secondary | ICD-10-CM | POA: Diagnosis not present

## 2015-09-07 DIAGNOSIS — R040 Epistaxis: Secondary | ICD-10-CM | POA: Diagnosis not present

## 2015-09-07 DIAGNOSIS — G40909 Epilepsy, unspecified, not intractable, without status epilepticus: Secondary | ICD-10-CM | POA: Diagnosis not present

## 2015-09-07 DIAGNOSIS — M329 Systemic lupus erythematosus, unspecified: Secondary | ICD-10-CM | POA: Diagnosis present

## 2015-09-07 DIAGNOSIS — R7309 Other abnormal glucose: Secondary | ICD-10-CM | POA: Diagnosis not present

## 2015-09-07 DIAGNOSIS — K7682 Hepatic encephalopathy: Secondary | ICD-10-CM

## 2015-09-07 DIAGNOSIS — Z87891 Personal history of nicotine dependence: Secondary | ICD-10-CM

## 2015-09-07 DIAGNOSIS — Z888 Allergy status to other drugs, medicaments and biological substances status: Secondary | ICD-10-CM

## 2015-09-07 DIAGNOSIS — A0472 Enterocolitis due to Clostridium difficile, not specified as recurrent: Secondary | ICD-10-CM

## 2015-09-07 DIAGNOSIS — I517 Cardiomegaly: Secondary | ICD-10-CM | POA: Diagnosis not present

## 2015-09-07 DIAGNOSIS — Z79899 Other long term (current) drug therapy: Secondary | ICD-10-CM

## 2015-09-07 LAB — I-STAT ARTERIAL BLOOD GAS, ED
BICARBONATE: 25 meq/L — AB (ref 20.0–24.0)
O2 Saturation: 98 %
TCO2: 26 mmol/L (ref 0–100)
pCO2 arterial: 40.9 mmHg (ref 35.0–45.0)
pH, Arterial: 7.394 (ref 7.350–7.450)
pO2, Arterial: 98 mmHg (ref 80.0–100.0)

## 2015-09-07 LAB — CBG MONITORING, ED
Glucose-Capillary: 131 mg/dL — ABNORMAL HIGH (ref 65–99)
Glucose-Capillary: 136 mg/dL — ABNORMAL HIGH (ref 65–99)
Glucose-Capillary: 96 mg/dL (ref 65–99)
Glucose-Capillary: 51 mg/dL — ABNORMAL LOW (ref 65–99)

## 2015-09-07 LAB — COMPREHENSIVE METABOLIC PANEL
ALT: 26 U/L (ref 14–54)
ANION GAP: 9 (ref 5–15)
AST: 90 U/L — ABNORMAL HIGH (ref 15–41)
Albumin: <1 g/dL — ABNORMAL LOW (ref 3.5–5.0)
Alkaline Phosphatase: 87 U/L (ref 38–126)
BILIRUBIN TOTAL: 3.8 mg/dL — AB (ref 0.3–1.2)
CO2: 23 mmol/L (ref 22–32)
Calcium: 7.3 mg/dL — ABNORMAL LOW (ref 8.9–10.3)
Chloride: 101 mmol/L (ref 101–111)
Creatinine, Ser: 2.52 mg/dL — ABNORMAL HIGH (ref 0.44–1.00)
GFR calc Af Amer: 23 mL/min — ABNORMAL LOW (ref >60)
GFR, EST NON AFRICAN AMERICAN: 20 mL/min — AB (ref >60)
Glucose, Bld: 264 mg/dL — ABNORMAL HIGH (ref 65–99)
POTASSIUM: 4 mmol/L (ref 3.5–5.1)
Sodium: 133 mmol/L — ABNORMAL LOW (ref 135–145)
TOTAL PROTEIN: 6.6 g/dL (ref 6.5–8.1)

## 2015-09-07 LAB — CBC WITH DIFFERENTIAL/PLATELET
Basophils Absolute: 0.1 10*3/uL (ref 0.0–0.1)
Basophils Relative: 1 %
EOS ABS: 0 10*3/uL (ref 0.0–0.7)
Eosinophils Relative: 0 %
HCT: 27.1 % — ABNORMAL LOW (ref 36.0–46.0)
HEMOGLOBIN: 8.9 g/dL — AB (ref 12.0–15.0)
Lymphocytes Relative: 25 %
Lymphs Abs: 1.5 10*3/uL (ref 0.7–4.0)
MCH: 33.3 pg (ref 26.0–34.0)
MCHC: 32.8 g/dL (ref 30.0–36.0)
MCV: 101.5 fL — ABNORMAL HIGH (ref 78.0–100.0)
MONO ABS: 0.6 10*3/uL (ref 0.1–1.0)
Monocytes Relative: 10 %
NEUTROS PCT: 64 %
Neutro Abs: 3.6 10*3/uL (ref 1.7–7.7)
Platelets: 37 10*3/uL — ABNORMAL LOW (ref 150–400)
RBC: 2.67 MIL/uL — AB (ref 3.87–5.11)
RDW: 21.9 % — ABNORMAL HIGH (ref 11.5–15.5)
WBC: 5.8 10*3/uL (ref 4.0–10.5)

## 2015-09-07 LAB — AMMONIA: AMMONIA: 134 umol/L — AB (ref 9–35)

## 2015-09-07 LAB — I-STAT CG4 LACTIC ACID, ED: Lactic Acid, Venous: 2.22 mmol/L (ref 0.5–2.0)

## 2015-09-07 LAB — SALICYLATE LEVEL

## 2015-09-07 LAB — LIPASE, BLOOD: Lipase: 21 U/L (ref 11–51)

## 2015-09-07 LAB — PROTIME-INR
INR: 2.58 — AB (ref 0.00–1.49)
PROTHROMBIN TIME: 27.4 s — AB (ref 11.6–15.2)

## 2015-09-07 LAB — TROPONIN I

## 2015-09-07 LAB — ACETAMINOPHEN LEVEL: Acetaminophen (Tylenol), Serum: <10 ug/mL — ABNORMAL LOW (ref 10–30)

## 2015-09-07 MED ORDER — LACTULOSE 10 GM/15ML PO SOLN
20.0000 g | Freq: Once | ORAL | Status: AC
  Administered 2015-09-07: 20 g via ORAL
  Filled 2015-09-07: qty 30

## 2015-09-07 MED ORDER — DEXTROSE 50 % IV SOLN
1.0000 | Freq: Once | INTRAVENOUS | Status: AC
  Administered 2015-09-08: 50 mL via INTRAVENOUS
  Filled 2015-09-07: qty 50

## 2015-09-07 MED ORDER — LORATADINE 10 MG PO TABS: 10.0000 mg | ORAL_TABLET | Freq: Every day | ORAL | Status: DC | PRN

## 2015-09-07 MED ORDER — ADULT MULTIVITAMIN W/MINERALS CH
1.0000 | ORAL_TABLET | Freq: Every day | ORAL | Status: DC
  Administered 2015-09-08 – 2015-09-12 (×3): 1 via ORAL
  Filled 2015-09-07 (×3): qty 1

## 2015-09-07 MED ORDER — NEPRO/CARBSTEADY PO LIQD
237.0000 mL | Freq: Three times a day (TID) | ORAL | Status: DC
Start: 2015-09-07 — End: 2015-09-08
  Administered 2015-09-07: 237 mL via ORAL
  Filled 2015-09-07 (×6): qty 237

## 2015-09-07 MED ORDER — NEPRO/CARBSTEADY PO LIQD
237.0000 mL | Freq: Three times a day (TID) | ORAL | Status: DC
  Filled 2015-09-07: qty 237

## 2015-09-07 MED ORDER — RAMELTEON 8 MG PO TABS
8.0000 mg | ORAL_TABLET | Freq: Every day | ORAL | Status: DC
  Administered 2015-09-08 – 2015-09-10 (×2): 8 mg via ORAL
  Filled 2015-09-07 (×5): qty 1

## 2015-09-07 MED ORDER — DEXTROSE 10 % IV SOLN
INTRAVENOUS | Status: DC
  Administered 2015-09-08: 05:00:00 via INTRAVENOUS

## 2015-09-07 MED ORDER — CAMPHOR-MENTHOL 0.5-0.5 % EX LOTN
1.0000 "application " | TOPICAL_LOTION | Freq: Every day | CUTANEOUS | Status: DC | PRN
  Filled 2015-09-07: qty 222

## 2015-09-07 MED ORDER — ACIDOPHILUS PROBIOTIC PO TABS: 2.0000 mg | ORAL_TABLET | Freq: Three times a day (TID) | ORAL | Status: DC

## 2015-09-07 MED ORDER — CYANOCOBALAMIN 250 MCG PO TABS
250.0000 ug | ORAL_TABLET | Freq: Every evening | ORAL | Status: DC
  Administered 2015-09-08 – 2015-09-10 (×3): 250 ug via ORAL
  Filled 2015-09-07 (×5): qty 1

## 2015-09-07 MED ORDER — SODIUM CHLORIDE 0.9 % IV SOLN: 250.0000 mL | INTRAVENOUS | Status: DC | PRN

## 2015-09-07 MED ORDER — VITAMIN B-1 100 MG PO TABS
100.0000 mg | ORAL_TABLET | Freq: Every day | ORAL | Status: DC
  Administered 2015-09-08 – 2015-09-12 (×3): 100 mg via ORAL
  Filled 2015-09-07 (×3): qty 1

## 2015-09-07 MED ORDER — SODIUM CHLORIDE 0.9% FLUSH: 3.0000 mL | INTRAVENOUS | Status: DC | PRN

## 2015-09-07 MED ORDER — FOLIC ACID 1 MG PO TABS
1.0000 mg | ORAL_TABLET | Freq: Every day | ORAL | Status: DC
  Administered 2015-09-08 – 2015-09-12 (×3): 1 mg via ORAL
  Filled 2015-09-07 (×4): qty 1

## 2015-09-07 MED ORDER — SODIUM CHLORIDE 0.9% FLUSH
3.0000 mL | Freq: Two times a day (BID) | INTRAVENOUS | Status: DC
  Administered 2015-09-08 – 2015-09-13 (×3): 3 mL via INTRAVENOUS

## 2015-09-07 MED ORDER — ALBUTEROL SULFATE (2.5 MG/3ML) 0.083% IN NEBU: 2.5000 mg | INHALATION_SOLUTION | RESPIRATORY_TRACT | Status: DC | PRN

## 2015-09-07 MED ORDER — SODIUM CHLORIDE 0.9% FLUSH
3.0000 mL | Freq: Two times a day (BID) | INTRAVENOUS | Status: DC
  Administered 2015-09-08 – 2015-09-13 (×2): 3 mL via INTRAVENOUS

## 2015-09-07 MED ORDER — LEVETIRACETAM 250 MG PO TABS
250.0000 mg | ORAL_TABLET | Freq: Every day | ORAL | Status: DC
  Administered 2015-09-08: 250 mg via ORAL
  Filled 2015-09-07 (×2): qty 1

## 2015-09-07 NOTE — ED Notes (Signed)
Report called, waiting for IV team to come and start IV

## 2015-09-07 NOTE — ED Notes (Signed)
Daughter - [phone]  Daughter left, needs to be called if pt is d/c rt she will be the one to transport her home.

## 2015-09-07 NOTE — Progress Notes (Signed)
RFV: initial visit for cdifficile colitis Subjective:    [name], female    DOB: [birth year], 60 y.o.   MRN: [id number]  HPI [name] [name] is a 60yo Female who was recently diagnosed with first episode of c.difficile infection, slow to respond to oral vancomycin. She was briefly transitioned to fidoxamicin due to concern for possible vanco associated thrombocytopenia (though oral vanco not thought to be absorbable).she is still very weak from recovering from her hospitalization. She is here with her daughter. She states that her BM have improved now having 3-4 semi-formed BM. She usually has 2 bm per day. She is still roughly 10-15 lb below her baseline weight. Still has poor oral intake.  Allergies  Allergen Reactions  . Amitriptyline Hcl Swelling    In the face.  . Doxycycline Hyclate Itching    Feels like something crawling under her skin     No current facility-administered medications on file prior to visit.   Current Outpatient Prescriptions on File Prior to Visit  Medication Sig Dispense Refill  . albuterol (PROAIR HFA) 108 (90 BASE) MCG/ACT inhaler Inhale 1-2 puffs into the lungs every 6 (six) hours as needed for wheezing or shortness of breath. 1 Inhaler 1  . calcium carbonate (TUMS - DOSED IN MG ELEMENTAL CALCIUM) 500 MG chewable tablet Chew 1,000 tablets by mouth at bedtime.    . camphor-menthol (SARNA) lotion Apply 1 application topically daily as needed for itching.    . cetirizine (ZYRTEC) 10 MG tablet Take 1 tablet (10 mg total) by mouth daily as needed for allergies. 30 tablet 3  . CREON 12000 UNITS CPEP capsule Take 1 capsule (12,000 Units total) by mouth 3 (three) times daily before meals. 270 capsule 3  . Darbepoetin Alfa (ARANESP) 150 MCG/0.3ML SOSY injection Inject 0.3 mLs (150 mcg total) into the vein every Wednesday with hemodialysis. 1.68 mL   . feeding supplement, ENSURE ENLIVE, (ENSURE ENLIVE) LIQD Take 237 mLs by mouth 3 (three) times daily with  meals. 237 mL 12  . FLUoxetine (PROZAC) 10 MG capsule Take 1 capsule (10 mg total) by mouth daily. For depression 90 capsule 3  . folic acid (FOLVITE) 1 MG tablet Take 1 tablet (1 mg total) by mouth daily. For folic acid replacement 30 tablet 6  . gabapentin (NEURONTIN) 300 MG capsule Take 1 capsule (300 mg total) by mouth daily. 90 capsule 3  . hydroxychloroquine (PLAQUENIL) 200 MG tablet Take 400 mg by mouth daily.    . Lactobacillus (ACIDOPHILUS PROBIOTIC PO) Take 2 mg by mouth 3 (three) times daily. Take 2 (two) tablets by mouth three times daily    . levETIRAcetam (KEPPRA) 250 MG tablet Take 1 tablet (250 mg total) by mouth daily. Take additional 500mg  (2 tablets) after Dialysis treatments on Monday, Wednesday, Friday 30 tablet 0  . mirtazapine (REMERON) 7.5 MG tablet Take 1 tablet (7.5 mg total) by mouth at bedtime. 90 tablet 3  . Multiple Vitamin (MULTIVITAMIN WITH MINERALS) TABS tablet Take 1 tablet by mouth daily. For vitamin replacement 90 tablet 3  . omeprazole (PRILOSEC) 40 MG capsule Take 1 capsule (40 mg total) by mouth daily. 90 capsule 3  . thiamine (VITAMIN B-1) 100 MG tablet Take 1 tablet (100 mg total) by mouth daily. For low thiamine 90 tablet 3  . vitamin B-12 (CYANOCOBALAMIN) 250 MCG tablet Take 1 tablet (250 mcg total) by mouth every evening. 90 tablet 3   Active Ambulatory Problems    Diagnosis Date Noted  .  Vitamin D deficiency 03/02/2008  . HYPERTRIGLYCERIDEMIA 11/03/2008  . 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 Alcohol abuse 07/02/2006  . Depression 09/12/2006  . GERD 03/02/2008  . Chronic pancreatitis ([place]) 05/18/2009  . Chronic pain disorder 06/28/2011  . 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 Seizure disorder ([place]) 09/07/2011  . 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 Severe protein-calorie malnutrition ([place]) 08/14/2012  . Hypoalbuminemia 08/14/2012  . Alcohol dependence ([place]) 11/06/2012  . Healthcare maintenance 01/30/2013  . Seasonal allergies  01/30/2013  . 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 Pericardial effusion 12/03/2013  . Systolic and diastolic CHF, chronic ([place]) 12/06/2013  . Anemia of chronic disease 06/20/2014  . Folate deficiency 06/20/2014  . Prolonged Q-T interval on ECG 06/20/2014  . Lupus (systemic lupus erythematosus) ([place]) 06/23/2014  . Coagulopathy ([place])   . ESRD (end stage renal disease) on dialysis ([place])   . Lactic acidosis   . Essential hypertension   . 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 Tricuspid regurgitation 02/11/2015  . C. difficile diarrhea 08/05/2015  . Infection due to ESBL-producing Escherichia coli 08/05/2015  . Influenza A 08/06/2015  . Recurrent Clostridium difficile diarrhea 08/19/2015  . B12 deficiency 08/19/2015  . Cirrhosis ([place])   . Anemia due to bone marrow failure ([place])   . Fever and chills   . Pancytopenia ([place]) 08/23/2015  . Thrombocytopenia ([place]) 08/23/2015  . Persistent fever   . Alcoholic cirrhosis of liver without ascites ([place])   . Pressure ulcer 08/28/2015  . AV fistula thrombosis ([place])   . Hypoglycemia 09/07/2015   Resolved Ambulatory Problems    Diagnosis Date Noted  . HYPOMAGNESEMIA 12/04/2007  . ANEMIA, B12 DEFICIENCY 11/16/2009  . Normochromic normocytic anemia 09/12/2006  . THROMBOCYTOPENIA 09/17/2006  . Hypertension, renal disease 04/05/2007  . SUBDURAL HEMATOMA 02/13/2008  . KNEE PAIN, RIGHT 04/22/2009  . Failure to thrive in adult 08/19/2007  . Hepatomegaly 07/02/2006  . ABNORMAL PAP SMEAR, LGSIL 07/23/2008  . SEIZURES, HX OF 12/04/2007  . Trochanteric bursitis 09/27/2010  . Depression 11/10/2010  . Right hip pain 11/21/2010  . Fever 12/20/2010  . Sacroiliac joint pain 12/28/2010  . Bronchitis 01/04/2011  . Fx humeral neck 04/17/2011  . Electrolyte abnormality 06/29/2011  . Streptococcal pneumonia ([place]) 05/20/2012  . Hypomagnesemia 05/20/2012  . Hypokalemia 05/20/2012  . Sepsis ([place]) 05/20/2012  . Tachycardia, chronic   05/23/2012  . Acute renal failure ([place]) 05/20/2012  . Bilateral lower extremity edema 08/07/2012  . CKD (chronic kidney disease) stage 4, GFR 15-29 ml/min ([facility]) 08/07/2012  . Hyperkalemia 08/07/2012  . Fluid overload 08/07/2012  . Abnormality of gait 09/10/2012  . Hypomagnesemia 10/30/2012  . UGIB (upper gastrointestinal bleed) 12/02/2012  . Aspiration pneumonia ([place]) 12/02/2012  . Acute respiratory failure ([place]) 12/02/2012  . Hypokalemia 12/08/2012  . Acute blood loss anemia 12/08/2012  . Closed fracture of unspecified part of upper end of humerus 01/30/2013  . Metabolic acidosis, normal anion gap (NAG) 03/20/2013  . Elevated liver enzymes 06/12/2013  . Metabolic acidosis, increased anion gap 06/12/2013  . Sepsis ([place]) 08/16/2013  . Pneumonia 08/16/2013  . Physical deconditioning 09/20/2013  . Metabolic acidosis with respiratory acidosis 11/22/2013  . Bronchitis, acute 11/22/2013  . Post-tussive vomiting 11/22/2013  . Acute on chronic kidney failure ([place]) 11/22/2013  . Increased anion gap metabolic acidosis 0000000  . Hyperkalemia 11/25/2013  . Diastolic congestive heart failure ([place]) 11/25/2013  . Chest pain, pleuritic 11/28/2013  . Hyperglycemia 11/28/2013  . Acute pericarditis 11/28/2013  . Fluid overload 11/28/2013  .  Acute pericarditis, unspecified 11/28/2013  . Left shoulder pain 11/28/2013  . Melena 12/17/2013  . Lower extremity edema 12/25/2013  . Dyspnea 01/01/2014  . Cough 05/06/2014  . Fatigue 06/18/2014  . Elevated ferritin level 06/20/2014  . SOB (shortness of breath) 09/17/2014  . Acute respiratory failure ([place]) 09/17/2014  . AKI (acute kidney injury) ([place]) 09/17/2014  . Leukocytosis 09/17/2014  . Chest pain 09/17/2014  . Hyponatremia 09/17/2014  . Hypocalcemia 09/17/2014  . Hypokalemia 09/17/2014  . Thrombocytopenia ([place]) 09/17/2014  . Elevated INR 09/18/2014  . Elevated troponin 09/18/2014  . Electrolyte abnormality 09/19/2014  . Bilateral leg  edema 09/30/2014  . Diarrhea 09/30/2014  . Metabolic acidosis 0000000  . End stage renal disease ([place]) 10/18/2014  . Recurrent pleural effusion on right   . Dyspnea   . Pleural effusion   . Arteriovenous fistula infection ([place]) 12/06/2014  . Pain   . Swelling   . ESRD on dialysis ([place])   . Lower extremity pain, right 01/22/2015  . Loss of weight 02/12/2015  . Acute encephalopathy 05/09/2015  . Chronic kidney disease   . Acute encephalopathy 05/09/2015  . Sepsis ([place]) 06/30/2015  . Fever, unspecified 07/13/2015  . Thrush 07/13/2015  . Hematemesis 07/30/2015  . GIB (gastrointestinal bleeding) 07/30/2015  . Chronic pancreatitis ([place]) 08/19/2015   Past Medical History  Diagnosis Date  . Anemia, B12 deficiency   . History of acute pancreatitis   . Right knee pain   . Abnormal Pap smear and cervical HPV (human papillomavirus)   . Hypertriglyceridemia   . GERD (gastroesophageal reflux disease)   . Subdural hematoma ([place]) 02/2008  . History of seizure disorder   . Failure to thrive in childhood   . HTN (hypertension)   . Joint pain   . Vitamin D deficiency   . Pancreatitis   . Insomnia   . Hyperlipidemia   . Pernicious anemia   . Macrocytic anemia   . Tuberculosis   . Arthritis   . CKD (chronic kidney disease), stage III   . Chronic diastolic CHF (congestive heart failure) ([place])   . Seizures ([place])   . On home oxygen therapy   . Shortness of breath dyspnea   . Renal insufficiency     Review of Systems  Constitutional: decreased activity, decreased appetite. 10 lb weight loss. Negative for fever, chills, diaphoresis, HENT: Negative for congestion, sore throat, rhinorrhea, sneezing, trouble swallowing and sinus pressure.  Eyes: Negative for photophobia and visual disturbance.  Respiratory: Negative for cough, chest tightness, shortness of breath, wheezing and stridor.  Cardiovascular: Negative for chest pain, palpitations and leg swelling.  Gastrointestinal: +  occasional nausea.  But no constipation, blood in stool, abdominal distention and anal bleeding.  Genitourinary: Negative for dysuria, hematuria, flank pain and difficulty urinating.  Musculoskeletal: Negative for myalgias, back pain, joint swelling, arthralgias and gait problem.  Skin: Negative for color change, pallor, rash and wound.  Neurological: Negative for dizziness, tremors, weakness and light-headedness.  Hematological: Negative for adenopathy. Does not bruise/bleed easily.  Psychiatric/Behavioral: Negative for behavioral problems, confusion, sleep disturbance, dysphoric mood, decreased concentration and agitation.       Objective:   Physical Exam BP 123/83 mmHg  Pulse 73  Temp(Src) 98.1 F (36.7 C) (Oral)  Wt 80 lb 8 oz (36.515 kg) Physical Exam  Constitutional:  oriented to person, place, and time. Chronically ill female,. No distress. Appears cachetic HENT: [place]/AT, PERRLA, no scleral icterus Mouth/Throat: Oropharynx is clear and moist. No oropharyngeal exudate.  Cardiovascular: Normal  rate, regular rhythm and normal heart sounds. Exam reveals no gallop and no friction rub.  No murmur heard.  Pulmonary/Chest: Effort normal and breath sounds normal. No respiratory distress.  has no wheezes.  Abdominal: Soft. Bowel sounds are normal.  exhibits no distension. There is no tenderness.  Skin: Skin is warm and dry. No rash noted. No erythema.       Assessment & Plan:  - discussed fmt if she has 1-2 relapse. Only has had 1 severe infection at this time. Still recovering from it. bm are 3 formed stools a day presently  - will change her omeprazole to prn  - add supplemental protein to diet for severe malnutrition  - she has a 27 yo daughter, [name] [name], who would be ideal candidate for stool donor  - she sees dr. Benson Norway as for chronic pancreatitis

## 2015-09-07 NOTE — ED Notes (Signed)
Pt arrives from dialysis via [facility]. After the pt finished her dialysis tx she was lethargic. The facility gave the pt some fluids and oxygen with no improvement and checked her cbg which was 24. Pt received 25 G of d50, a mountain dew and a protein shake prior to ems arrival. Pt has no hx of diabetes.

## 2015-09-07 NOTE — ED Notes (Signed)
MD at bedside. 

## 2015-09-07 NOTE — ED Notes (Signed)
IV team bedside. 

## 2015-09-07 NOTE — H&P (Signed)
Date: 09/07/2015               Patient Name:  [name] [name] MRN: [id number]  DOB: 1955/08/30 Age / Sex: 60 y.o., female   PCP: Shela Leff, MD         Medical Service: Internal Medicine Teaching Service         Attending Physician: Dr. Bartholomew Crews, MD    First Contact: Wille Celeste, MS4 Pager: [phone]  Second Contact: Dr. Albin Felling Pager: [phone]       After Hours (After 5p/  First Contact Pager: [phone]  weekends / holidays): Second Contact Pager: [phone]   Chief Complaint: Generalized weakness, lightheadedness, somnolence  History of Present Illness: Mrs. 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 She was in her usual state of health beforehand and even saw Dr. Baxter Flattery in ID without any symptoms earlier in the day. However, she felt somnolent, overall weak and lightheaded without other associated symptoms at the end of HD. Her CBG was checked which was 28 and recovered with mountain dew and D50. She does say she has good appetite, is eating well, denies fevers, malaise, vision changes, syncope, focal weakness/numbness, diaphoresis, chest pain, palpitations, shortness of breath, nausea, vomiting, or diarrhea, as well as any changes in urination. She says her diarrhea is much improved from her previous C.diff-related hospitalizations, now having 1-2 semi-solid brown BMs per day without blood or mucus. She has no other complaints at this time.  Meds: Current Facility-Administered Medications  Medication Dose Route Frequency Provider Last Rate Last Dose  . 0.9 %  sodium chloride infusion  250 mL Intravenous PRN Milagros Loll, MD      . feeding supplement (NEPRO CARB STEADY) liquid 237 mL  237 mL Oral TID BM Bartholomew Crews,  MD      . sodium chloride flush (NS) 0.9 % injection 3 mL  3 mL Intravenous Q12H Milagros Loll, MD      . sodium chloride flush (NS) 0.9 % injection 3 mL  3 mL Intravenous Q12H Milagros Loll, MD      . sodium chloride flush (NS) 0.9 % injection 3 mL  3 mL Intravenous PRN Milagros Loll, MD       Current Outpatient Prescriptions  Medication Sig Dispense Refill  . albuterol (PROAIR HFA) 108 (90 BASE) MCG/ACT inhaler Inhale 1-2 puffs into the lungs every 6 (six) hours as needed for wheezing or shortness of breath. 1 Inhaler 1  . azaTHIOprine (IMURAN) 50 MG tablet Take 50 mg by mouth daily.    . calcium carbonate (TUMS - DOSED IN MG ELEMENTAL CALCIUM) 500 MG chewable tablet Chew 1,000 tablets by mouth at bedtime.    . camphor-menthol (SARNA) lotion Apply 1 application topically daily as needed for itching.    . cetirizine (ZYRTEC) 10 MG tablet Take 1 tablet (10 mg total) by mouth daily as needed for allergies. 30 tablet 3  . CREON 12000 UNITS CPEP capsule Take 1 capsule (12,000 Units total) by mouth 3 (three) times daily before meals. 270 capsule 3  . FLUoxetine (PROZAC) 10 MG capsule Take 1 capsule (10 mg total) by mouth daily. For depression 90 capsule 3  . folic acid (FOLVITE) 1 MG tablet Take 1 tablet (  1 mg total) by mouth daily. For folic acid replacement 30 tablet 6  . gabapentin (NEURONTIN) 300 MG capsule Take 1 capsule (300 mg total) by mouth daily. 90 capsule 3  . hydroxychloroquine (PLAQUENIL) 200 MG tablet Take 400 mg by mouth daily.    . Lactobacillus (ACIDOPHILUS PROBIOTIC PO) Take 2 mg by mouth 3 (three) times daily. Take 2 (two) tablets by mouth three times daily    . levETIRAcetam (KEPPRA) 250 MG tablet Take 1 tablet (250 mg total) by mouth daily. Take additional 500mg  (2 tablets) after Dialysis treatments on Monday, Wednesday, Friday 30 tablet 0  . loperamide (IMODIUM) 2 MG capsule Take 2 mg by mouth every 6 (six) hours as needed for diarrhea or loose stools.    . mirtazapine  (REMERON) 7.5 MG tablet Take 1 tablet (7.5 mg total) by mouth at bedtime. 90 tablet 3  . Multiple Vitamin (MULTIVITAMIN WITH MINERALS) TABS tablet Take 1 tablet by mouth daily. For vitamin replacement 90 tablet 3  . nystatin (MYCOSTATIN) 100000 UNIT/ML suspension Use as directed 5 mLs in the mouth or throat 4 (four) times daily.    Marland Kitchen omeprazole (PRILOSEC) 40 MG capsule Take 1 capsule (40 mg total) by mouth daily. 90 capsule 3  . oxyCODONE (OXY IR/ROXICODONE) 5 MG immediate release tablet Take 5 mg by mouth every 6 (six) hours as needed for moderate pain or severe pain.    . ramelteon (ROZEREM) 8 MG tablet Take 8 mg by mouth at bedtime.    . thiamine (VITAMIN B-1) 100 MG tablet Take 1 tablet (100 mg total) by mouth daily. For low thiamine 90 tablet 3  . vitamin B-12 (CYANOCOBALAMIN) 250 MCG tablet Take 1 tablet (250 mcg total) by mouth every evening. 90 tablet 3  . Darbepoetin Alfa (ARANESP) 150 MCG/0.3ML SOSY injection Inject 0.3 mLs (150 mcg total) into the vein every Wednesday with hemodialysis. 1.68 mL   . feeding supplement, ENSURE ENLIVE, (ENSURE ENLIVE) LIQD Take 237 mLs by mouth 3 (three) times daily with meals. 237 mL 12    Allergies: Allergies as of 09/07/2015 - Review Complete 09/07/2015  Allergen Reaction Noted  . Amitriptyline hcl Swelling   . Doxycycline hyclate Itching 11/10/2010   Past Medical History  Diagnosis Date  . Anemia, B12 deficiency   . History of acute pancreatitis   . Right knee pain     No recent imaging on chart  . 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 Dr. Mancel Bale, [facility]  . Hypertriglyceridemia   . GERD (gastroesophageal reflux disease)   . Subdural hematoma ([place]) 02/2008    Likely 2/2 trauma from seizure from EtOH withdrawal, chronic in nature, sees Dr. Jerene Bears. Most recent CT head 10/2009 showing stable but persistent hematoma without mass effect.  . History of seizure disorder      Likely 2/2 alcohol abuse  . Hypocalcemia   . Hypomagnesemia   . Failure to thrive in childhood     Unclear etiology  . HTN (hypertension)   . Thrombocytopenia ([place])   . Hepatomegaly     On exam  . Joint pain   . Alcohol abuse   . Vitamin D deficiency   . Pancreatitis   . Insomnia   . Hyperlipidemia   . Pernicious anemia   . Macrocytic anemia   . 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 Depression   . Fx humeral neck 04/17/2011  Transverse fracture- minimally displaced- managed as outpatient   . ABNORMAL PAP SMEAR, LGSIL 07/23/2008    Annotation: HPV positive CIN I Dr. Mancel Bale, [facility] Qualifier: Diagnosis of  By: Oretha Ellis    . Pneumonia 05/20/2012  . Arthritis     "shoulders" (08/15/2013)  . 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  Marland Kitchen Chronic diastolic CHF (congestive heart failure) ([place])   . Hypomagnesemia   . Seizures ([place])     "don't know when/why I had them; daughter was always there w/me"  . On home oxygen therapy     "2L; 24/7" (07/31/2015)  . Shortness of breath dyspnea   . Pleural effusion   . Renal insufficiency    Past Surgical History  Procedure Laterality Date  . Cesarean section  1983  . Esophagogastroduodenoscopy  07/11/2011    Procedure: ESOPHAGOGASTRODUODENOSCOPY (EGD);  Surgeon: Beryle Beams, MD;  Location: [facility];  Service: Endoscopy;  Laterality: N/A;  . Colonoscopy  07/11/2011    Procedure: COLONOSCOPY;  Surgeon: Beryle Beams, MD;  Location: [facility];  Service: Endoscopy;  Laterality: N/A;  . Eye surgery Left     "trauma"  . Right colectomy  08/28/2011  . Esophagogastroduodenoscopy N/A 12/01/2012    Procedure: ESOPHAGOGASTRODUODENOSCOPY (EGD);  Surgeon: Irene Shipper, MD;  Location: [facility];  Service: Endoscopy;  Laterality: N/A;  . Colonoscopy with esophagogastroduodenoscopy (egd) Left 08/21/2013    Procedure: COLONOSCOPY WITH ESOPHAGOGASTRODUODENOSCOPY (EGD);  Surgeon:  Beryle Beams, MD;  Location: [facility] ENDOSCOPY;  Service: Endoscopy;  Laterality: Left;  . Subxyphoid pericardial window N/A 12/04/2013    Procedure: SUBXYPHOID PERICARDIAL WINDOW WITH TEE;  Surgeon: Ivin Poot, MD;  Location: [place];  Service: Thoracic;  Laterality: N/A;  . Intraoperative transesophageal echocardiogram N/A 12/04/2013    Procedure: INTRAOPERATIVE TRANSESOPHAGEAL ECHOCARDIOGRAM;  Surgeon: Ivin Poot, MD;  Location: [place];  Service: Open Heart Surgery;  Laterality: N/A;  . Exchange of a dialysis catheter Left 10/22/2014    Procedure: EXCHANGE OF A DIALYSIS CATHETER;  Surgeon: Conrad West Wareham, MD;  Location: [place];  Service: Vascular;  Laterality: Left;  . Av fistula placement Left 12/03/2014    Procedure: INSERTION OF ARTERIOVENOUS (AV) GORE-TEX GRAFT ARM;  Surgeon: Mal Misty, MD;  Location: [facility] OR;  Service: Vascular;  Laterality: Left;   Family History  Problem Relation Age of Onset  . Cancer Mother     Died from stomach cancer and "flesh eating rash  . Heart failure Father     Died in 59s from an MI  . Alcohol abuse Sister     Twin sister drinks a lot, as did both her parents and brothers  . Stroke Brother     Has 7 brothers, 1 with CVA  . Lupus Mother    Social History   Social History  . Marital Status: Divorced    Spouse Name: N/A  . Number of Children: N/A  . Years of Education: N/A   Occupational History  . Not on file.   Social History Main Topics  . Smoking status: Former Smoker -- 0.50 packs/day for 40 years    Types: Cigarettes    Quit date: 09/20/2010  . Smokeless tobacco: Never Used  . Alcohol Use: 0.0 oz/week    0 Standard drinks or equivalent per week     Comment: 12/02/14 "graduated from [employer] (for alcohol abuse) in October 2015" ;  07/31/2015 "in alcohol rehab now"  . Drug Use: Yes    Special: Marijuana, Cocaine     Comment: 12/02/14 "last drug use was in 2012"  . Sexual Activity: Not Currently   Other Topics Concern  . Not  on file   Social History Narrative   Lives with her significant other and 2 grandchildren. 1 child   Has 7 brothers and 4 sisters, 1 twin sister.   Unemployed, worked in [employer].    Abuses alcohol-drinks 1 glass of wine daily    No drug use. Former cigarette use quit 1.5 years ago.     11 th grade education            Review of Systems: Pertinent items noted in HPI and remainder of comprehensive ROS otherwise negative.  Physical Exam: Blood pressure 135/84, pulse 70, temperature 97.4 F (36.3 C), temperature source Oral, resp. rate 10, SpO2 100 %.   Gen: Cachectic-appearing, alert and oriented to person, place, but not time (year is 1920) HEENT: Oropharynx clear without erythema or exudate.  Neck: No cervical LAD, no thyromegaly or nodules, no JVD noted. CV: Normal rate, regular rhythm, no murmurs, rubs, or gallops Pulmonary: Normal effort, CTA bilaterally, no crackles or wheezes Abdominal: Soft, non-tender, non-distended, without rebound, guarding, or masses Extremities: Distal pulses 2+ in upper and lower extremities bilaterally, no tenderness, erythema or edema Neuro: CN II-XII grossly intact, no focal weakness or sensory deficits noted. Arm jerking bilaterally when arms extended, but no clear asterixis noted. Skin: No atypical appearing moles. No rashes  Lab results: Basic Metabolic Panel:  Recent Labs  09/07/15 1816  NA 133*  K 4.0  CL 101  CO2 23  GLUCOSE 264*  BUN <5*  CREATININE 2.52*  CALCIUM 7.3*   Liver Function Tests:  Recent Labs  09/07/15 1816  AST 90*  ALT 26  ALKPHOS 87  BILITOT 3.8*  PROT 6.6  ALBUMIN <1.0*    Recent Labs  09/07/15 1816  LIPASE 21    Recent Labs  09/07/15 1825  AMMONIA 134*   CBC:  Recent Labs  09/07/15 1816  WBC 5.8  NEUTROABS 3.6  HGB 8.9*  HCT 27.1*  MCV 101.5*  PLT 37*   Cardiac Enzymes:  Recent Labs  09/07/15 1816  TROPONINI <0.03   CBG:  Recent Labs  09/07/15 1638 09/07/15 2003  09/07/15 2104  GLUCAP 131* 136* 96   Coagulation:  Recent Labs  09/07/15 1816  LABPROT 27.4*  INR 2.58*   Other results: EKG: normal sinus rhythm, prolonged QT interval.  Assessment & Plan by Problem: 1. Altered mental status - transient weakness, somnolence, and lightheadedness after HD in the setting of CBG 28. Her symptoms are most likely due to hypoglycemia, likely from decreased PO intake as well as multiple medications that may be contributing. As she is immunocompromised, recently on prolonged broad-spec abx for recurrent C.dif and with h/o ESBL UTIs, we must ensure she is not brewing another infection that could account for her hypoglycemia as well. 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 Given these findings (with hyperammonemia or not given the test's unreliability), her liver disease may be significantly contributing to her symptoms. Finally, although symptomatic hypoglycemia is most likely, relative adrenal insufficiency is another consideration given her history of severe autoimmune disease as well as recent illness from C.diff requiring prolonged  hospitalization, which could account for her soft BPs and low CBG. -Start D10 @10ml /hr; diet + feeding supplements as tolerated with juice/D50 PRN for low CBGs -CXR with new LLL infiltrate; will start on cefepime with transition to oral as tolerated -Lactulose now, consider repeat if clinical response -Will check AM cortisol, ACTH -Follow-up CXR, UA, cultures -Trend CBC, BMP  2. Recent resistant C.dif infection - discharged less than a week ago for this clinically improved, just followed with Dr. Storm Frisk office today -Continue acidophilus probiotic  3. ESRD 2/2 FSGS on MWF HD - underwent HD today because unable to get transportation  from nursing facility yesterday. Recently had successful declot of her AVF by IR on 09/01/15  -Consult nephrology in AM for possible HD  4. Protein and calorie malnutrition -Continue mirtazapine -Continue feeding supplements  5. SLE - azathioprine was recently d/c'ed due to worsening severe thrombocytopenia and normal dsDNA on last admission. Without evidence of a flare currently. -Continue hydroxychloroquine  6. Seizure disorder - denies recent seizure activity -Continue keppra   Dispo: Disposition is deferred at this time, awaiting improvement of current medical problems. Anticipated discharge in approximately 1-3 day(s).   The patient does have a current PCP Shela Leff, MD) and does need an [facility] hospital follow-up appointment after discharge.  The patient does not have transportation limitations that hinder transportation to clinic appointments.  Signed: Norval Gable, MD 09/07/2015, 10:41 PM

## 2015-09-07 NOTE — ED Notes (Signed)
Pt seems fatigued and is cold to the touch.

## 2015-09-07 NOTE — Care Management (Signed)
CM noted patient recently discharge from [facility] to [facility]. Patient presents today from dialysis with lethargy and CBG was noted to be 24.mg/dl D-50 given. ED evaluation still in progress. Will continue to monitor for transitional care plan.

## 2015-09-07 NOTE — ED Provider Notes (Signed)
CSN: [id number]     Arrival date & time 09/07/15  1631 History   First MD Initiated Contact with Patient 09/07/15 1636     Chief Complaint  Patient presents with  . Hypoglycemia     (Consider location/radiation/quality/duration/timing/severity/associated sxs/prior Treatment) HPI Patient was sent from dialysis with an episode of confusion and hypoglycemia. She had completed her dialysis and was found to be poorly responsive and somnolent. Some additional fluids were provided at dialysis and patient was found to first have a very low read on her CVG and then a read of 28. She was given food and beverage. Amp of D50 was administered. Patient's CBG improved to the low 100s. She however continued to be somnolent and seemed confused. Patient does not have a history of insulin-dependent diabetes. Patient is a poor historian. She denies that she has been significantly ill from baseline but of note she has severe, multiple chronic illnesses. She seems uncertain of her compliance with medications. Reports she takes "so many of them". She denies known fever or localizing pain. Past Medical History  Diagnosis Date  . Anemia, B12 deficiency   . History of acute pancreatitis   . Right knee pain     No recent imaging on chart  . 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 Dr. Mancel Bale, [facility]  . Hypertriglyceridemia   . GERD (gastroesophageal reflux disease)   . Subdural hematoma ([place]) 02/2008    Likely 2/2 trauma from seizure from EtOH withdrawal, chronic in nature, sees Dr. Jerene Bears. Most recent CT head 10/2009 showing stable but persistent hematoma without mass effect.  . History of seizure disorder     Likely 2/2 alcohol abuse  . Hypocalcemia   . Hypomagnesemia   . Failure to thrive in childhood     Unclear etiology  . HTN (hypertension)   . Thrombocytopenia ([place])   . Hepatomegaly     On exam  . Joint pain   . Alcohol abuse   .  Vitamin D deficiency   . Pancreatitis   . Insomnia   . Hyperlipidemia   . Pernicious anemia   . Macrocytic anemia   . 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 Depression   . Fx humeral neck 04/17/2011    Transverse fracture- minimally displaced- managed as outpatient   . ABNORMAL PAP SMEAR, LGSIL 07/23/2008    Annotation: HPV positive CIN I Dr. Mancel Bale, [facility] Qualifier: Diagnosis of  By: Oretha Ellis    . Pneumonia 05/20/2012  . Arthritis     "shoulders" (08/15/2013)  . 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  Marland Kitchen Chronic diastolic CHF (congestive heart failure) ([place])   . Hypomagnesemia   . Seizures ([place])     "don't know when/why I had them; daughter was always there w/me"  . On home oxygen therapy     "2L; 24/7" (07/31/2015)  . Shortness of breath dyspnea   . Pleural effusion   . Renal insufficiency    Past Surgical History  Procedure Laterality Date  . Cesarean section  1983  . Esophagogastroduodenoscopy  07/11/2011    Procedure: ESOPHAGOGASTRODUODENOSCOPY (EGD);  Surgeon: Beryle Beams, MD;  Location: [facility];  Service: Endoscopy;  Laterality: N/A;  . Colonoscopy  07/11/2011    Procedure: COLONOSCOPY;  Surgeon: Beryle Beams, MD;  Location: [facility] ENDOSCOPY;  Service: Endoscopy;  Laterality: N/A;  . Eye surgery Left     "trauma"  . Right colectomy  08/28/2011  . Esophagogastroduodenoscopy N/A 12/01/2012    Procedure: ESOPHAGOGASTRODUODENOSCOPY (EGD);  Surgeon: Irene Shipper, MD;  Location: [facility];  Service: Endoscopy;  Laterality: N/A;  . Colonoscopy with esophagogastroduodenoscopy (egd) Left 08/21/2013    Procedure: COLONOSCOPY WITH ESOPHAGOGASTRODUODENOSCOPY (EGD);  Surgeon: Beryle Beams, MD;  Location: [facility] ENDOSCOPY;  Service: Endoscopy;  Laterality: Left;  . Subxyphoid pericardial window N/A 12/04/2013    Procedure: SUBXYPHOID PERICARDIAL WINDOW WITH TEE;  Surgeon: Ivin Poot, MD;  Location: [place];  Service:  Thoracic;  Laterality: N/A;  . Intraoperative transesophageal echocardiogram N/A 12/04/2013    Procedure: INTRAOPERATIVE TRANSESOPHAGEAL ECHOCARDIOGRAM;  Surgeon: Ivin Poot, MD;  Location: [place];  Service: Open Heart Surgery;  Laterality: N/A;  . Exchange of a dialysis catheter Left 10/22/2014    Procedure: EXCHANGE OF A DIALYSIS CATHETER;  Surgeon: Conrad Yuba, MD;  Location: [place];  Service: Vascular;  Laterality: Left;  . Av fistula placement Left 12/03/2014    Procedure: INSERTION OF ARTERIOVENOUS (AV) GORE-TEX GRAFT ARM;  Surgeon: Mal Misty, MD;  Location: [facility];  Service: Vascular;  Laterality: Left;   Family History  Problem Relation Age of Onset  . Cancer Mother     Died from stomach cancer and "flesh eating rash  . Heart failure Father     Died in 74s from an MI  . Alcohol abuse Sister     Twin sister drinks a lot, as did both her parents and brothers  . Stroke Brother     Has 7 brothers, 1 with CVA  . Lupus Mother    Social History  Substance Use Topics  . Smoking status: Former Smoker -- 0.50 packs/day for 40 years    Types: Cigarettes    Quit date: 09/20/2010  . Smokeless tobacco: Never Used  . Alcohol Use: 0.0 oz/week    0 Standard drinks or equivalent per week     Comment: 12/02/14 "graduated from [place] (for alcohol abuse) in October 2015" ; 07/31/2015 "in alcohol rehab now"   OB History    No data available     Review of Systems  10 Systems reviewed and are negative for acute change except as noted in the HPI.   Allergies  Amitriptyline hcl and Doxycycline hyclate  Home Medications   Prior to Admission medications   Medication Sig Start Date End Date Taking? Authorizing Provider  albuterol (PROAIR HFA) 108 (90 BASE) MCG/ACT inhaler Inhale 1-2 puffs into the lungs every 6 (six) hours as needed for wheezing or shortness of breath. 01/14/14  Yes Jones Bales, MD  azaTHIOprine (IMURAN) 50 MG tablet Take 50 mg by mouth daily.   Yes  Historical Provider, MD  calcium carbonate (TUMS - DOSED IN MG ELEMENTAL CALCIUM) 500 MG chewable tablet Chew 1,000 tablets by mouth at bedtime.   Yes Historical Provider, MD  camphor-menthol Timoteo Ace) lotion Apply 1 application topically daily as needed for itching.   Yes Historical Provider, MD  cetirizine (ZYRTEC) 10 MG tablet Take 1 tablet (10 mg total) by mouth daily as needed for allergies. 06/11/15  Yes Shela Leff, MD  CREON 12000 UNITS CPEP capsule Take 1 capsule (12,000 Units total) by mouth 3 (three) times daily before meals. 01/21/15  Yes Shela Leff, MD  FLUoxetine (PROZAC) 10 MG capsule Take 1 capsule (10 mg total) by mouth daily. For depression 01/21/15  Yes  Shela Leff, MD  folic acid (FOLVITE) 1 MG tablet Take 1 tablet (1 mg total) by mouth daily. For folic acid replacement 06/08/15  Yes Shela Leff, MD  gabapentin (NEURONTIN) 300 MG capsule Take 1 capsule (300 mg total) by mouth daily. 04/09/15 04/06/16 Yes Shela Leff, MD  hydroxychloroquine (PLAQUENIL) 200 MG tablet Take 400 mg by mouth daily.   Yes Historical Provider, MD  Lactobacillus (ACIDOPHILUS PROBIOTIC PO) Take 2 mg by mouth 3 (three) times daily. Take 2 (two) tablets by mouth three times daily   Yes Historical Provider, MD  levETIRAcetam (KEPPRA) 250 MG tablet Take 1 tablet (250 mg total) by mouth daily. Take additional 500mg  (2 tablets) after Dialysis treatments on Monday, Wednesday, Friday 07/16/15  Yes Shary Decamp, PA-C  loperamide (IMODIUM) 2 MG capsule Take 2 mg by mouth every 6 (six) hours as needed for diarrhea or loose stools.   Yes Historical Provider, MD  mirtazapine (REMERON) 7.5 MG tablet Take 1 tablet (7.5 mg total) by mouth at bedtime. 01/21/15  Yes Shela Leff, MD  Multiple Vitamin (MULTIVITAMIN WITH MINERALS) TABS tablet Take 1 tablet by mouth daily. For vitamin replacement 03/24/15  Yes Shela Leff, MD  nystatin (MYCOSTATIN) 100000 UNIT/ML suspension Use as directed 5 mLs in the  mouth or throat 4 (four) times daily.   Yes Historical Provider, MD  omeprazole (PRILOSEC) 40 MG capsule Take 1 capsule (40 mg total) by mouth daily. 01/21/15  Yes Shela Leff, MD  oxyCODONE (OXY IR/ROXICODONE) 5 MG immediate release tablet Take 5 mg by mouth every 6 (six) hours as needed for moderate pain or severe pain.   Yes Historical Provider, MD  ramelteon (ROZEREM) 8 MG tablet Take 8 mg by mouth at bedtime.   Yes Historical Provider, MD  thiamine (VITAMIN B-1) 100 MG tablet Take 1 tablet (100 mg total) by mouth daily. For low thiamine 01/21/15  Yes Shela Leff, MD  vitamin B-12 (CYANOCOBALAMIN) 250 MCG tablet Take 1 tablet (250 mcg total) by mouth every evening. 01/21/15  Yes Shela Leff, MD  Darbepoetin Alfa (ARANESP) 150 MCG/0.3ML SOSY injection Inject 0.3 mLs (150 mcg total) into the vein every Wednesday with hemodialysis. 08/04/15   Iline Oven, MD  feeding supplement, ENSURE ENLIVE, (ENSURE ENLIVE) LIQD Take 237 mLs by mouth 3 (three) times daily with meals. 09/02/15   Zada Finders, MD   BP 135/84 mmHg  Pulse 70  Temp(Src) 97.4 F (36.3 C) (Oral)  Resp 10  SpO2 100% Physical Exam  Constitutional:  Patient is extremely thin and malnutrition in appearance. She does not show respiratory distress. She seems mildly somnolent or confused.  HENT:  Head: Normocephalic and atraumatic.  Mouth/Throat: Oropharynx is clear and moist.  Eyes: EOM are normal.  Mild symmetric periorbital edema.  Neck: Neck supple.  Cardiovascular: Normal rate and intact distal pulses.   2/6 systolic ejection murmur. No gross rub or gallop  Pulmonary/Chest: Effort normal and breath sounds normal. No respiratory distress. She has no wheezes. She has no rales.  Abdominal: Soft. Bowel sounds are normal. She exhibits no distension. There is no tenderness. There is no rebound and no guarding.  Musculoskeletal:  Extremities are very cachectic and thin. No peripheral edema or calf tenderness.   Neurological:  Patient is somnolent and mildly confused. She does answer simple questions appropriately. Follows commands for assisting in grip strength which is symmetric and movement of bilateral lower extremities.  Skin: Skin is warm and dry.    ED Course  Procedures (including critical  care time) Labs Review Labs Reviewed  COMPREHENSIVE METABOLIC PANEL - Abnormal; Notable for the following:    Sodium 133 (*)    Glucose, Bld 264 (*)    BUN <5 (*)    Creatinine, Ser 2.52 (*)    Calcium 7.3 (*)    Albumin <1.0 (*)    AST 90 (*)    Total Bilirubin 3.8 (*)    GFR calc non Af Amer 20 (*)    GFR calc Af Amer 23 (*)    All other components within normal limits  ACETAMINOPHEN LEVEL - Abnormal; Notable for the following:    Acetaminophen (Tylenol), Serum <10 (*)    All other components within normal limits  CBC WITH DIFFERENTIAL/PLATELET - Abnormal; Notable for the following:    RBC 2.67 (*)    Hemoglobin 8.9 (*)    HCT 27.1 (*)    MCV 101.5 (*)    RDW 21.9 (*)    Platelets 37 (*)    All other components within normal limits  PROTIME-INR - Abnormal; Notable for the following:    Prothrombin Time 27.4 (*)    INR 2.58 (*)    All other components within normal limits  AMMONIA - Abnormal; Notable for the following:    Ammonia 134 (*)    All other components within normal limits  CBG MONITORING, ED - Abnormal; Notable for the following:    Glucose-Capillary 131 (*)    All other components within normal limits  I-STAT CG4 LACTIC ACID, ED - Abnormal; Notable for the following:    Lactic Acid, Venous 2.22 (*)    All other components within normal limits  I-STAT ARTERIAL BLOOD GAS, ED - Abnormal; Notable for the following:    Bicarbonate 25.0 (*)    All other components within normal limits  CBG MONITORING, ED - Abnormal; Notable for the following:    Glucose-Capillary 136 (*)    All other components within normal limits  LIPASE, BLOOD  TROPONIN I  SALICYLATE LEVEL  ETHANOL   CORTISOL-AM, BLOOD  ACTH  URINALYSIS, ROUTINE W REFLEX MICROSCOPIC (NOT AT [facility])  CBG MONITORING, ED    Imaging Review No results found. I have personally reviewed and evaluated these images and lab results as part of my medical decision-making.   EKG Interpretation   Date/Time:  Tuesday September 07 2015 21:04:00 EDT Ventricular Rate:  69 PR Interval:  195 QRS Duration: 96 QT Interval:  499 QTC Calculation: 535 R Axis:   114 Text Interpretation:  Sinus rhythm Anterolateral infarct, old Prolonged QT  interval Confirmed by Johnney Killian, MD, Jeannie Done [phone]) on 09/07/2015 9:48:41 PM     Consult: (20:52) internal medicine teaching service will see patient in the emergency department for admission. MDM   Final diagnoses:  Hepatic encephalopathy ([place])  ESRD (end stage renal disease) on dialysis ([place])  Hypoglycemia   Patient presents some dialysis with confusion and somnolence. She had been hypoglycemic. This was treated and she has not had at this time recurrence of hypoglycemia. Patient does however have significantly elevated ammonia consistent with hepatic encephalopathy. Patient appears to have history of hepatic failure but is not regularly on lactulose per medication review. The patient was not aware of lactulose as a medication that she takes regularly. She does not have associated fever, constitutional symptoms, pain or general illness to suggest infectious etiology. Treatment is initiated in emergency department with lactulose.    Charlesetta Shanks, MD 09/07/15 2149

## 2015-09-08 ENCOUNTER — Ambulatory Visit: Payer: Self-pay | Admitting: *Deleted

## 2015-09-08 ENCOUNTER — Inpatient Hospital Stay: Payer: Self-pay | Admitting: Internal Medicine

## 2015-09-08 ENCOUNTER — Encounter (HOSPITAL_COMMUNITY): Payer: Self-pay | Admitting: *Deleted

## 2015-09-08 DIAGNOSIS — E162 Hypoglycemia, unspecified: Secondary | ICD-10-CM

## 2015-09-08 DIAGNOSIS — F1021 Alcohol dependence, in remission: Secondary | ICD-10-CM

## 2015-09-08 DIAGNOSIS — Y95 Nosocomial condition: Secondary | ICD-10-CM | POA: Diagnosis not present

## 2015-09-08 DIAGNOSIS — A047 Enterocolitis due to Clostridium difficile: Secondary | ICD-10-CM

## 2015-09-08 DIAGNOSIS — J189 Pneumonia, unspecified organism: Secondary | ICD-10-CM

## 2015-09-08 DIAGNOSIS — Z992 Dependence on renal dialysis: Secondary | ICD-10-CM

## 2015-09-08 DIAGNOSIS — G9340 Encephalopathy, unspecified: Secondary | ICD-10-CM

## 2015-09-08 DIAGNOSIS — E46 Unspecified protein-calorie malnutrition: Secondary | ICD-10-CM

## 2015-09-08 DIAGNOSIS — G40909 Epilepsy, unspecified, not intractable, without status epilepticus: Secondary | ICD-10-CM

## 2015-09-08 DIAGNOSIS — N186 End stage renal disease: Secondary | ICD-10-CM

## 2015-09-08 DIAGNOSIS — M3214 Glomerular disease in systemic lupus erythematosus: Secondary | ICD-10-CM

## 2015-09-08 LAB — RENAL FUNCTION PANEL
Anion gap: 9 (ref 5–15)
CO2: 25 mmol/L (ref 22–32)
Calcium: 7.8 mg/dL — ABNORMAL LOW (ref 8.9–10.3)
Chloride: 104 mmol/L (ref 101–111)
Creatinine, Ser: 3.07 mg/dL — ABNORMAL HIGH (ref 0.44–1.00)
GFR calc Af Amer: 18 mL/min — ABNORMAL LOW (ref >60)
GFR, EST NON AFRICAN AMERICAN: 16 mL/min — AB (ref >60)
GLUCOSE: 135 mg/dL — AB (ref 65–99)
PHOSPHORUS: 1.4 mg/dL — AB (ref 2.5–4.6)
Potassium: 4.6 mmol/L (ref 3.5–5.1)
SODIUM: 138 mmol/L (ref 135–145)

## 2015-09-08 LAB — CBC
HCT: 28.6 % — ABNORMAL LOW (ref 36.0–46.0)
Hemoglobin: 9.5 g/dL — ABNORMAL LOW (ref 12.0–15.0)
MCH: 33.3 pg (ref 26.0–34.0)
MCHC: 33.2 g/dL (ref 30.0–36.0)
MCV: 100.4 fL — ABNORMAL HIGH (ref 78.0–100.0)
Platelets: 36 10*3/uL — ABNORMAL LOW (ref 150–400)
RBC: 2.85 MIL/uL — ABNORMAL LOW (ref 3.87–5.11)
RDW: 21.6 % — AB (ref 11.5–15.5)
WBC: 7.8 10*3/uL (ref 4.0–10.5)

## 2015-09-08 LAB — GLUCOSE, CAPILLARY
GLUCOSE-CAPILLARY: 126 mg/dL — AB (ref 65–99)
GLUCOSE-CAPILLARY: 271 mg/dL — AB (ref 65–99)
GLUCOSE-CAPILLARY: 30 mg/dL — AB (ref 65–99)
GLUCOSE-CAPILLARY: 90 mg/dL (ref 65–99)
Glucose-Capillary: 107 mg/dL — ABNORMAL HIGH (ref 65–99)
Glucose-Capillary: 95 mg/dL (ref 65–99)
Glucose-Capillary: 140 mg/dL — ABNORMAL HIGH (ref 65–99)
Glucose-Capillary: 71 mg/dL (ref 65–99)
Glucose-Capillary: 86 mg/dL (ref 65–99)

## 2015-09-08 LAB — PHOSPHORUS: Phosphorus: 1.8 mg/dL — ABNORMAL LOW (ref 2.5–4.6)

## 2015-09-08 LAB — MAGNESIUM: MAGNESIUM: 1.6 mg/dL — AB (ref 1.7–2.4)

## 2015-09-08 LAB — ETHANOL

## 2015-09-08 LAB — CORTISOL-AM, BLOOD: CORTISOL - AM: 13.5 ug/dL (ref 6.7–22.6)

## 2015-09-08 MED ORDER — DEXTROSE 5 % IV SOLN
2.0000 g | INTRAVENOUS | Status: DC
  Administered 2015-09-08: 2 g via INTRAVENOUS
  Filled 2015-09-08 (×4): qty 2

## 2015-09-08 MED ORDER — CALCITRIOL 0.5 MCG PO CAPS: 1.2500 ug | ORAL_CAPSULE | ORAL | Status: DC

## 2015-09-08 MED ORDER — RISAQUAD PO CAPS
1.0000 | ORAL_CAPSULE | Freq: Every day | ORAL | Status: DC
  Administered 2015-09-08 – 2015-09-12 (×3): 1 via ORAL
  Filled 2015-09-08 (×4): qty 1

## 2015-09-08 MED ORDER — GLUCOSE 40 % PO GEL
ORAL | Status: AC
  Filled 2015-09-08: qty 1

## 2015-09-08 MED ORDER — PANCRELIPASE (LIP-PROT-AMYL) 12000-38000 UNITS PO CPEP
12000.0000 [IU] | ORAL_CAPSULE | Freq: Three times a day (TID) | ORAL | Status: DC
  Administered 2015-09-08 – 2015-09-12 (×6): 12000 [IU] via ORAL
  Filled 2015-09-08 (×8): qty 1

## 2015-09-08 MED ORDER — ENSURE ENLIVE PO LIQD
237.0000 mL | Freq: Three times a day (TID) | ORAL | Status: DC
  Administered 2015-09-10 – 2015-09-12 (×4): 237 mL via ORAL

## 2015-09-08 MED ORDER — FLUOXETINE HCL 10 MG PO CAPS
10.0000 mg | ORAL_CAPSULE | Freq: Every day | ORAL | Status: DC
  Administered 2015-09-10 – 2015-09-12 (×2): 10 mg via ORAL
  Filled 2015-09-08 (×4): qty 1

## 2015-09-08 MED ORDER — DEXTROSE 50 % IV SOLN
INTRAVENOUS | Status: AC
  Administered 2015-09-08: 25 mL via INTRAVENOUS
  Filled 2015-09-08: qty 50

## 2015-09-08 MED ORDER — LEVETIRACETAM 500 MG PO TABS
500.0000 mg | ORAL_TABLET | ORAL | Status: DC
Start: 2015-09-08 — End: 2015-09-12
  Administered 2015-09-10: 500 mg via ORAL
  Filled 2015-09-08 (×2): qty 1

## 2015-09-08 MED ORDER — MAGNESIUM OXIDE 400 (241.3 MG) MG PO TABS
400.0000 mg | ORAL_TABLET | Freq: Every day | ORAL | Status: DC
  Administered 2015-09-08 – 2015-09-12 (×3): 400 mg via ORAL
  Filled 2015-09-08 (×3): qty 1

## 2015-09-08 MED ORDER — HYDROXYCHLOROQUINE SULFATE 200 MG PO TABS
400.0000 mg | ORAL_TABLET | Freq: Every day | ORAL | Status: DC
  Administered 2015-09-08 – 2015-09-12 (×3): 400 mg via ORAL
  Filled 2015-09-08 (×4): qty 2

## 2015-09-08 MED ORDER — PANTOPRAZOLE SODIUM 40 MG PO TBEC
40.0000 mg | DELAYED_RELEASE_TABLET | Freq: Every day | ORAL | Status: DC
  Administered 2015-09-08: 40 mg via ORAL
  Filled 2015-09-08: qty 1

## 2015-09-08 MED ORDER — DARBEPOETIN ALFA 200 MCG/0.4ML IJ SOSY
200.0000 ug | PREFILLED_SYRINGE | INTRAMUSCULAR | Status: DC
  Administered 2015-09-08: 200 ug via INTRAVENOUS

## 2015-09-08 MED ORDER — DEXTROSE 50 % IV SOLN
INTRAVENOUS | Status: AC
  Filled 2015-09-08: qty 50

## 2015-09-08 MED ORDER — DARBEPOETIN ALFA 200 MCG/0.4ML IJ SOSY
PREFILLED_SYRINGE | INTRAMUSCULAR | Status: AC
  Filled 2015-09-08: qty 0.4

## 2015-09-08 MED ORDER — MAGNESIUM SULFATE IN D5W 10-5 MG/ML-% IV SOLN
1.0000 g | Freq: Once | INTRAVENOUS | Status: DC
  Filled 2015-09-08: qty 100

## 2015-09-08 NOTE — Progress Notes (Signed)
Subjective: [name] [name] is feeling better today.  She denies pain, fever, dizziness at this time.  She was able to eat breakfast this morning.    Objective: Vital signs in last 24 hours: Filed Vitals:   09/07/15 2100 09/07/15 2115 09/08/15 0116 09/08/15 0515  BP: 144/78 135/84 135/84 114/62  Pulse:   71 76  Temp:   97.4 F (36.3 C) 98.2 F (36.8 C)  TempSrc:   Oral Axillary  Resp: 14 10 12 16   Height:   5\' 1"  (1.549 m)   Weight:   38.102 kg (84 lb)   SpO2:   100% 100%   Weight change:  No intake or output data in the 24 hours ending 09/08/15 1031 BP 114/62 mmHg  Pulse 76  Temp(Src) 98.2 F (36.8 C) (Axillary)  Resp 16  Ht 5\' 1"  (1.549 m)  Wt 38.102 kg (84 lb)  BMI 15.88 kg/m2  SpO2 100%  Physical Exam: Appearance: Cachectic appearing woman resting peacefully in bed CV: Normal rate, regular rhythm, no murmurs, rubs, or gallops Pulmonary: Normal effort, CTA bilaterally, no crackles or wheezes Abdominal: Soft, non-tender, non-distended, without rebound, guarding, or masses Extremities: Distal pulses 2+ in upper and lower extremities bilaterally, no tenderness, erythema or edema Neuro: Alert and oriented x3   Lab Results: Basic Metabolic Panel:  Recent Labs Lab 09/07/15 1816 09/08/15 0220 09/08/15 0649  NA 133*  --  138  K 4.0  --  4.6  CL 101  --  104  CO2 23  --  25  GLUCOSE 264*  --  135*  BUN <5*  --  <5*  CREATININE 2.52*  --  3.07*  CALCIUM 7.3*  --  7.8*  MG  --  1.6*  --   PHOS  --  1.8* 1.4*   Liver Function Tests:  Recent Labs Lab 09/07/15 1816 09/08/15 0649  AST 90*  --   ALT 26  --   ALKPHOS 87  --   BILITOT 3.8*  --   PROT 6.6  --   ALBUMIN <1.0* <1.0*    Recent Labs Lab 09/07/15 1816  LIPASE 21    Recent Labs Lab 09/07/15 1825  AMMONIA 134*   CBC:  Recent Labs Lab 09/07/15 1816 09/08/15 0649  WBC 5.8 7.8  NEUTROABS 3.6  --   HGB 8.9* 9.5*  HCT 27.1* 28.6*  MCV 101.5* 100.4*  PLT 37* 36*   Cardiac  Enzymes:  Recent Labs Lab 09/07/15 1816  TROPONINI <0.03   BNP: No results for input(s): PROBNP in the last 168 hours. D-Dimer: No results for input(s): DDIMER in the last 168 hours. CBG:  Recent Labs Lab 09/07/15 2104 09/07/15 2327 09/08/15 0117 09/08/15 0145 09/08/15 0459 09/08/15 0809  GLUCAP 96 51* 30* 271* 126* 107*   Hemoglobin A1C: No results for input(s): HGBA1C in the last 168 hours. Fasting Lipid Panel: No results for input(s): CHOL, HDL, LDLCALC, TRIG, CHOLHDL, LDLDIRECT in the last 168 hours. Thyroid Function Tests: No results for input(s): TSH, T4TOTAL, FREET4, T3FREE, THYROIDAB in the last 168 hours. Coagulation:  Recent Labs Lab 09/07/15 1816  LABPROT 27.4*  INR 2.58*   Anemia Panel: No results for input(s): VITAMINB12, FOLATE, FERRITIN, TIBC, IRON, RETICCTPCT in the last 168 hours. Urine Drug Screen: Drugs of Abuse     Component Value Date/Time   LABOPIA NONE DETECTED 07/16/2015 1400   LABOPIA NEG 09/18/2011 0936   COCAINSCRNUR NONE DETECTED 07/16/2015 1400   COCAINSCRNUR NEG 09/18/2011 0936   LABBENZ NONE  DETECTED 07/16/2015 1400   LABBENZ NEG 09/18/2011 0936   LABBENZ NEG 04/10/2011 1130   AMPHETMU NONE DETECTED 07/16/2015 1400   AMPHETMU NEG 09/18/2011 0936   AMPHETMU NEG 04/10/2011 1130   THCU NONE DETECTED 07/16/2015 1400   THCU NEG 09/18/2011 0936   LABBARB NONE DETECTED 07/16/2015 1400   LABBARB NEG 09/18/2011 0936    Alcohol Level:  Recent Labs Lab 09/08/15 [place] <5   Urinalysis: No results for input(s): COLORURINE, LABSPEC, PHURINE, GLUCOSEU, HGBUR, BILIRUBINUR, KETONESUR, PROTEINUR, UROBILINOGEN, NITRITE, LEUKOCYTESUR in the last 168 hours.  Invalid input(s): APPERANCEUR  Micro Results: No results found for this or any previous visit (from the past 240 hour(s)). Studies/Results: Dg Chest 2 View  09/07/2015  CLINICAL DATA:  Hypoglycemia.  Lethargy after dialysis. EXAM: CHEST  2 VIEW COMPARISON:  08/23/2015 FINDINGS:  Cardiomegaly and mediastinal contours are unchanged. There is a new opacity in the medial left lower lobe with left pleural effusion. Right lung is clear. No pulmonary edema. No pneumothorax. Chronic deformity of the left proximal humerus. The bones are under mineralized. IMPRESSION: 1. Medial left lung base opacity, concerning for pneumonia. Small associated left pleural effusion. Followup PA and lateral chest X-ray is recommended in 3-4 weeks following trial of antibiotic therapy to ensure resolution and exclude underlying malignancy. 2. Stable cardiomegaly. Electronically Signed   By: Jeb Levering M.D.   On: 09/07/2015 22:56   Medications: I have reviewed the patient's current medications. Scheduled Meds: . acidophilus  1 capsule Oral Daily  . ceFEPime (MAXIPIME) IV  2 g Intravenous Q M,W,F-HD  . dextrose      . feeding supplement (NEPRO CARB STEADY)  237 mL Oral TID BM  . folic acid  1 mg Oral Daily  . levETIRAcetam  250 mg Oral Daily  . levETIRAcetam  500 mg Oral Q M,W,F-2000  . magnesium sulfate 1 - 4 g bolus IVPB  1 g Intravenous Once  . multivitamin with minerals  1 tablet Oral Daily  . ramelteon  8 mg Oral QHS  . sodium chloride flush  3 mL Intravenous Q12H  . sodium chloride flush  3 mL Intravenous Q12H  . thiamine  100 mg Oral Daily  . vitamin B-12  250 mcg Oral QPM   Continuous Infusions: . dextrose 10 mL/hr at 09/08/15 0500   PRN Meds:.sodium chloride, albuterol, camphor-menthol, loratadine, sodium chloride flush Assessment/Plan: Active Problems:   Hypoglycemia [name] [name] is a 60 y.o. 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   Community acquired pneumonia: Patient presented with hypoglycemia and fatigue and weakness.  Had a blood glucose of 28 after HD on 09/07/2015.  CXR was significant for RLL pulmonary  infiltrate concerning for pneumonia, which could be the underlying cause of her hypoglycemia.  Unlikely to be MRSA at this time, but will obtain MRSA swab.  Will start broad spectrum coverage for gram negative bacteria with cefepime.  Cefepime also has some coverage for atypical species, since she is allergic to doxycycline, and had a prolonged QT interval, which is a contraindication to azithromycin.   --Continue cefepime, 2g IV @100  mL/hr --Pharmacy consult for cefepime management in ESRD patient --Trend CBC, BMP  Hypoglycemia: Likely 2/2 developing pneumonia and poor oral intake and liver failure.  Unlikely to be 2/2 EtOH as her BAC was undetectable.  A less likely alternative is  relative adrenal insufficiency given her severe autoimmune disease, as well as her recent prolonged hospitalization for C. Diff.  She also is on several drugs that have the potential to cause hypoglycemia so we will hold those for now until her BG returns to normal. Improved with D50 and PO intake.  --Start D10 @10  ml/hr; diet and feeding supplements as tolerated with juice/D50 PRN for low CBG. --Follow up AM cortisol, ACTH --Hold Creon, Prozac, Gabapentin as they have remote risk of hypoglycemia  AMS: Has now resolved.  Likely 2/2 hypoglycemia rather than hyperammonemia (ammonia 134).  Got lactulose in ED.  No asterixis or other clinical signs of hepatic encephalopathy.   --discontinue lactulose  Recent resistant C.dif infection - discharged less than a week ago for this clinically improved, just followed with Dr. Storm Frisk office today -Continue acidophilus probiotic  ESRD 2/2 FSGS on MWF HD - underwent HD today because unable to get transportation from nursing facility yesterday. Recently had successful declot of her AVF by IR on 09/01/15  -Nephology following; will decide on HD on 4/5 vs 4/6.    Protein and calorie malnutrition -Continue mirtazapine -Continue feeding supplements  SLE - azathioprine was  recently d/c'ed due to worsening severe thrombocytopenia and normal dsDNA on last admission. Without evidence of a flare currently. -Continue hydroxychloroquine  Seizure disorder - denies recent seizure activity -Continue keppra   Dispo: Disposition is deferred at this time, awaiting improvement of current medical problems. Anticipated discharge in approximately 1-3 day(s).   This is a Careers information officer Note.  The care of the patient was discussed with Dr. Lynnae January and the assessment and plan formulated with their assistance.  Please see their attached note for official documentation of the daily encounter.     [place], Med Student 09/08/2015, 10:31 AM

## 2015-09-08 NOTE — Progress Notes (Signed)
Subjective:  [facility] with Hypoglycemia/Acute Encephalopathy/ Hcap.  Recently here dc 09/02/15 C. Diff / FTT dc to snh. HD schedule MWF and has not missed any post dc. This am no cos but still mildly lethargic.   Objective Vital signs in last 24 hours: Filed Vitals:   09/07/15 2100 09/07/15 2115 09/08/15 0116 09/08/15 0515  BP: 144/78 135/84 135/84 114/62  Pulse:   71 76  Temp:   97.4 F (36.3 C) 98.2 F (36.8 C)  TempSrc:   Oral Axillary  Resp: 14 10 12 16   Height:   5\' 1"  (1.549 m)   Weight:   38.102 kg (84 lb)   SpO2:   100% 100%   Weight change:   Physical Exam: General: lethargic/ but responsive/ thin AAF Heart: RRR, no mur,rub Lungs: R cTA / L base Rales Abdomen: soft, nt, Nd, Incres BS Extremities: Dialysis Access: LUA AVF pos bruit Neuro= none focal / Gen weakness   OP Dialysis Orders: [facility] MWF 3.5 hr 160 350/A 1.5 EDW 34.5 4 K 2.25 Ca no profile heparin 2000 Mircera 200 q 2 wks -to be given 3/1 calcitriol 1.25  On hold sec to ^ ca / left upper AVGG Recent labs: last hosp hgb 9.4 Fe 86 no tsat ferritin 1684 iPTH 298 Ca 7.8 Phos 2.9 Alb <1.0  Problem/Plan 1. ESRD - HD today attempt 3 l uf  By wt 3.5 l up  k 4.6 2. Hypoglycemia- per admit 3. PNA LLL - per admit ' 4. Encephalopathy-  Wu per admit/ Multple etiologies  Acute illness/ ho recent FTT/multple prob admit  5. HTN/volume - vol as above  135/84 bp no meds 6. Anemia of ESRD - hgb 9.5 aranesp q wed hd   7. Secondary hyperparathyroidism - low phos 1.4/ No binder/ ^ ca hld po vit d / on regular diet  8. Protein / Calorie Malnutrition -  Alb<1.0 - admit team rx  reg diet/ protein supplements/ renal vit.  9. SLE- On meds per admit =hydroxychloroquine 10. Depression  11. Recent C, Diff admit-seen yesterday 09/07/15.  Dr. Storm Frisk office  ON acidophilus probiotic  Ernest Haber, PA-C [facility] Beeper [phone] 09/08/2015,11:50 AM   Pt seen, examined, agree w assess/plan as above  with additions as indicated. Frail ESRD pt w cirrhosis/ hx etoh in the past/ hx SLE.  Just here for extended stay due to refractory Cdif infection, returning 1 wk later with low BS and possible LLL PNA (though no fever/ *wbc). Due for HD today. See orders.  Kelly Splinter MD [facility] pager [phone]    cell [phone] 09/08/2015, 1:47 PM     Labs: Basic Metabolic Panel:  Recent Labs Lab 09/02/15 0545 09/07/15 1816 09/08/15 0220 09/08/15 0649  NA 136 133*  --  138  K 4.0 4.0  --  4.6  CL 105 101  --  104  CO2 23 23  --  25  GLUCOSE 121* 264*  --  135*  BUN <5* <5*  --  <5*  CREATININE 3.82* 2.52*  --  3.07*  CALCIUM 7.8* 7.3*  --  7.8*  PHOS  --   --  1.8* 1.4*   Liver Function Tests:  Recent Labs Lab 09/07/15 1816 09/08/15 0649  AST 90*  --   ALT 26  --   ALKPHOS 87  --   BILITOT 3.8*  --   PROT 6.6  --   ALBUMIN <1.0* <1.0*    Recent Labs Lab  09/07/15 1816  LIPASE 21    Recent Labs Lab 09/07/15 1825  AMMONIA 134*   CBC:  Recent Labs Lab 09/02/15 0545 09/07/15 1816 09/08/15 0649  WBC 9.0 5.8 7.8  NEUTROABS  --  3.6  --   HGB 9.4* 8.9* 9.5*  HCT 29.5* 27.1* 28.6*  MCV 100.7* 101.5* 100.4*  PLT 41* 37* 36*   Cardiac Enzymes:  Recent Labs Lab 09/07/15 1816  TROPONINI <0.03   CBG:  Recent Labs Lab 09/08/15 0117 09/08/15 0145 09/08/15 0459 09/08/15 0809 09/08/15 1127  GLUCAP 30* 271* 126* 107* 95    Studies/Results: Dg Chest 2 View  09/07/2015  CLINICAL DATA:  Hypoglycemia.  Lethargy after dialysis. EXAM: CHEST  2 VIEW COMPARISON:  08/23/2015 FINDINGS: Cardiomegaly and mediastinal contours are unchanged. There is a new opacity in the medial left lower lobe with left pleural effusion. Right lung is clear. No pulmonary edema. No pneumothorax. Chronic deformity of the left proximal humerus. The bones are under mineralized. IMPRESSION: 1. Medial left lung base opacity, concerning for pneumonia. Small associated left pleural  effusion. Followup PA and lateral chest X-ray is recommended in 3-4 weeks following trial of antibiotic therapy to ensure resolution and exclude underlying malignancy. 2. Stable cardiomegaly. Electronically Signed   By: Jeb Levering M.D.   On: 09/07/2015 22:56   Medications: . dextrose 10 mL/hr at 09/08/15 0500   . acidophilus  1 capsule Oral Daily  . ceFEPime (MAXIPIME) IV  2 g Intravenous Q M,W,F-HD  . dextrose      . feeding supplement (NEPRO CARB STEADY)  237 mL Oral TID BM  . FLUoxetine  10 mg Oral Daily  . folic acid  1 mg Oral Daily  . hydroxychloroquine  400 mg Oral Daily  . levETIRAcetam  250 mg Oral Daily  . levETIRAcetam  500 mg Oral Q M,W,F-2000  . lipase/protease/amylase  12,000 Units Oral TID AC  . magnesium sulfate 1 - 4 g bolus IVPB  1 g Intravenous Once  . multivitamin with minerals  1 tablet Oral Daily  . pantoprazole  40 mg Oral Daily  . ramelteon  8 mg Oral QHS  . sodium chloride flush  3 mL Intravenous Q12H  . sodium chloride flush  3 mL Intravenous Q12H  . thiamine  100 mg Oral Daily  . vitamin B-12  250 mcg Oral QPM

## 2015-09-08 NOTE — Progress Notes (Signed)
Pt is very somulent, unable to wake up for more than a few seconds with eyes fluttering shut. Pt CBG 71 BP 96/52 HR 80's. Pt will not take PO's but was able to restart D10 IVF in new channel. MD [place] aware and will continue to monitor pt.

## 2015-09-08 NOTE — Progress Notes (Signed)
Received patient from ED this morning A/O x 3, d/o to time,with a CBG of 30, administered 1/2 amp D50 (1 amp D50 also recently given in ED) and CBG up to 271.  Vitals stable, NSR on the monitor.  Will continue to monitor patient.  Randell Patient

## 2015-09-08 NOTE — NC FL2 (Signed)
[place] LEVEL OF CARE SCREENING TOOL     IDENTIFICATION  Patient Name: [name] [name] Birthdate: 1955-10-04 Sex: female Admission Date (Current Location): 09/07/2015  [facility] and [place] Number:  Herbalist and Address:  The [place], [place] [address], [place], [place] [address]      Provider Number: [id number]  Attending Physician Name and Address:  Bartholomew Crews, MD  Relative Name and Phone Number:       Current Level of Care: Hospital Recommended Level of Care: [place] Prior Approval Number:    Date Approved/Denied:   PASRR Number: [id number]  Discharge Plan: SNF    Current Diagnoses: Patient Active Problem List   Diagnosis Date Noted  . Hypoglycemia 09/07/2015  . 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 Pressure ulcer 08/28/2015  . Alcoholic cirrhosis of liver without ascites ([place])   . Persistent fever   . Pancytopenia ([place]) 08/23/2015  . Thrombocytopenia ([place]) 08/23/2015  . Fever and chills   . Anemia due to bone marrow failure ([place])   . Cirrhosis ([place])   . Recurrent Clostridium difficile diarrhea 08/19/2015  . B12 deficiency 08/19/2015  . Influenza A 08/06/2015  . C. difficile diarrhea 08/05/2015  . Infection due to ESBL-producing Escherichia coli 08/05/2015  . Tricuspid regurgitation 02/11/2015  . Essential hypertension   . 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 Lactic acidosis   . ESRD (end stage renal disease) on dialysis ([place])   . Coagulopathy ([place])   . Lupus (systemic lupus erythematosus) ([place]) 06/23/2014  . Anemia of chronic disease 06/20/2014  . Folate deficiency 06/20/2014  . Prolonged Q-T interval on ECG 06/20/2014  . Systolic and diastolic CHF, chronic ([place]) 12/06/2013  . Pericardial effusion 12/03/2013  . 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 Healthcare maintenance 01/30/2013  . Seasonal allergies 01/30/2013  . Alcohol dependence ([place]) 11/06/2012  . 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 Severe protein-calorie malnutrition ([place]) 08/14/2012  . Hypoalbuminemia 08/14/2012  . Seizure disorder ([place]) 09/07/2011  . 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 Chronic pain disorder 06/28/2011  . Chronic pancreatitis ([place]) 05/18/2009  . HYPERTRIGLYCERIDEMIA 11/03/2008  . Vitamin D deficiency 03/02/2008  . GERD 03/02/2008  . Hyperparathyroidism, secondary renal ([place]) 12/04/2007  . Depression 09/12/2006  . Alcohol abuse 07/02/2006    Orientation RESPIRATION BLADDER Height & Weight     Self, Time, Situation, Place  O2 (2L) Incontinent Weight: 75 lb 13.4 oz (34.4 kg) Height:  5\' 1"  (154.9 cm)  BEHAVIORAL SYMPTOMS/MOOD NEUROLOGICAL BOWEL NUTRITION STATUS      Incontinent Diet  AMBULATORY STATUS COMMUNICATION OF NEEDS Skin   Limited Assist Verbally PU Stage and Appropriate Care   PU Stage 2 Dressing: No Dressing (Right and Medial Buttocks - dressing change PRN)                   Personal Care Assistance Level of Assistance  Bathing, Feeding, Dressing Bathing Assistance: Limited assistance Feeding assistance: Independent Dressing Assistance: Limited assistance     Functional Limitations Info             SPECIAL CARE FACTORS FREQUENCY  PT (By licensed PT), OT (By licensed OT), Speech therapy     PT Frequency: 3 (3) OT Frequency: 3            Contractures Contractures Info: Not present    Additional Factors Info  Code Status, Allergies, Isolation Precautions Code Status Info:  (Full) Allergies Info: Amitriptyline Hcl,  Doxycycline Hyclate     Isolation Precautions Info: MRSA by pcr 09/17/14     Current Medications (09/08/2015):  This is the current hospital active medication list Current Facility-Administered Medications  Medication Dose Route Frequency Provider Last Rate Last Dose  . 0.9 %  sodium chloride infusion  250 mL Intravenous PRN Milagros Loll, MD      . acidophilus (RISAQUAD) capsule 1 capsule  1 capsule Oral Daily  Bartholomew Crews, MD   1 capsule at 09/08/15 1052  . albuterol (PROVENTIL) (2.5 MG/3ML) 0.083% nebulizer solution 2.5 mg  2.5 mg Nebulization Q4H PRN Milagros Loll, MD      . camphor-menthol [facility]) lotion 1 application  1 application Topical Daily PRN Milagros Loll, MD      . ceFEPIme (MAXIPIME) 2 g in dextrose 5 % 50 mL IVPB  2 g Intravenous Q M,W,F-HD Bartholomew Crews, MD   2 g at 09/08/15 0138  . [START ON 09/15/2015] Darbepoetin Alfa (ARANESP) injection 200 mcg  200 mcg Intravenous Q Wed-HD Ernest Haber, PA-C      . dextrose 10 % infusion   Intravenous Continuous Milagros Loll, MD 10 mL/hr at 09/08/15 0500    . feeding supplement (ENSURE ENLIVE) (ENSURE ENLIVE) liquid 237 mL  237 mL Oral TID BM Shela Leff, MD      . FLUoxetine (PROZAC) capsule 10 mg  10 mg Oral Daily Corky Sox, MD      . folic acid (FOLVITE) tablet 1 mg  1 mg Oral Daily Milagros Loll, MD   1 mg at 09/08/15 1052  . hydroxychloroquine (PLAQUENIL) tablet 400 mg  400 mg Oral Daily Corky Sox, MD   400 mg at 09/08/15 1532  . levETIRAcetam (KEPPRA) tablet 250 mg  250 mg Oral Daily Milagros Loll, MD   250 mg at 09/08/15 1052  . levETIRAcetam (KEPPRA) tablet 500 mg  500 mg Oral Q M,W,F-2000 Bartholomew Crews, MD      . lipase/protease/amylase (CREON) capsule 12,000 Units  12,000 Units Oral TID [facility] Corky Sox, MD   12,000 Units at 09/08/15 1532  . loratadine (CLARITIN) tablet 10 mg  10 mg Oral Daily PRN Milagros Loll, MD      . magnesium oxide (MAG-OX) tablet 400 mg  400 mg Oral Daily Corky Sox, MD   400 mg at 09/08/15 1533  . magnesium sulfate IVPB 1 g 100 mL  1 g Intravenous Once Milagros Loll, MD   1 g at 09/08/15 1522  . multivitamin with minerals tablet 1 tablet  1 tablet Oral Daily Milagros Loll, MD   1 tablet at 09/08/15 1052  . pantoprazole (PROTONIX) EC tablet 40 mg  40 mg Oral Daily Corky Sox, MD   40 mg at 09/08/15 1533  . ramelteon (ROZEREM) tablet 8 mg  8 mg Oral QHS  Milagros Loll, MD   8 mg at 09/08/15 0153  . sodium chloride flush (NS) 0.9 % injection 3 mL  3 mL Intravenous Q12H Milagros Loll, MD   3 mL at 09/08/15 1053  . sodium chloride flush (NS) 0.9 % injection 3 mL  3 mL Intravenous Q12H Milagros Loll, MD   3 mL at 09/08/15 1053  . sodium chloride flush (NS) 0.9 % injection 3 mL  3 mL Intravenous PRN Milagros Loll, MD      . thiamine (VITAMIN B-1) tablet 100 mg  100 mg Oral Daily  Milagros Loll, MD   100 mg at 09/08/15 1052  . vitamin B-12 (CYANOCOBALAMIN) tablet 250 mcg  250 mcg Oral QPM Milagros Loll, MD   250 mcg at 09/08/15 0153     Discharge Medications: Please see discharge summary for a list of discharge medications.  Relevant Imaging Results:  Relevant Lab Results:   Additional Information [id number]  Barbette Or, [place]

## 2015-09-08 NOTE — Progress Notes (Signed)
Pt still very somulent and unable to wake up to take meds. CBG increased from 71 to 90 and VSS. Per MD [place], ok to not give meds tonight and allow patient to rest. Will continue to monitor CBG and VS but if stable will attempt to give meds in am. Daughter at bedside and aware. Will continue to monitor.

## 2015-09-08 NOTE — Progress Notes (Signed)
Subjective: Not interested in interacting with the team. Says her breathing is okay. Oriented.   Objective: Vital signs in last 24 hours: Filed Vitals:   09/07/15 2100 09/07/15 2115 09/08/15 0116 09/08/15 0515  BP: 144/78 135/84 135/84 114/62  Pulse:   71 76  Temp:   97.4 F (36.3 C) 98.2 F (36.8 C)  TempSrc:   Oral Axillary  Resp: 14 10 12 16   Height:   5\' 1"  (1.549 m)   Weight:   84 lb (38.102 kg)   SpO2:   100% 100%   Weight change:   Intake/Output Summary (Last 24 hours) at 09/08/15 1115 Last data filed at 09/08/15 0800  Gross per 24 hour  Intake    120 ml  Output      0 ml  Net    120 ml   Physical Exam: General: Cachectic appearing female, alert, cooperative, NAD.  HEENT: PERRL, EOMI. Moist mucus membranes Neck: Full range of motion without pain, supple, no lymphadenopathy or carotid bruits Lungs: Clear to ascultation bilaterally, normal work of respiration, no wheezes, rales, rhonchi. No dullness to percussion.  Heart: RRR, no murmurs, gallops, or rubs Abdomen: Soft, non-tender, non-distended, BS + Extremities: No cyanosis, clubbing, or edema. LUE AVF. Good thrill and bruit.  Neurologic: Alert & oriented X3, cranial nerves II-XII intact, strength grossly intact, sensation intact to light touch   Lab Results: Basic Metabolic Panel:  Recent Labs Lab 09/07/15 1816 09/08/15 0220 09/08/15 0649  NA 133*  --  138  K 4.0  --  4.6  CL 101  --  104  CO2 23  --  25  GLUCOSE 264*  --  135*  BUN <5*  --  <5*  CREATININE 2.52*  --  3.07*  CALCIUM 7.3*  --  7.8*  MG  --  1.6*  --   PHOS  --  1.8* 1.4*   Liver Function Tests:  Recent Labs Lab 09/07/15 1816 09/08/15 0649  AST 90*  --   ALT 26  --   ALKPHOS 87  --   BILITOT 3.8*  --   PROT 6.6  --   ALBUMIN <1.0* <1.0*    Recent Labs Lab 09/07/15 1816  LIPASE 21    Recent Labs Lab 09/07/15 1825  AMMONIA 134*   CBC:  Recent Labs Lab 09/07/15 1816 09/08/15 0649  WBC 5.8 7.8  NEUTROABS  3.6  --   HGB 8.9* 9.5*  HCT 27.1* 28.6*  MCV 101.5* 100.4*  PLT 37* 36*   Cardiac Enzymes:  Recent Labs Lab 09/07/15 1816  TROPONINI <0.03   CBG:  Recent Labs Lab 09/07/15 2104 09/07/15 2327 09/08/15 0117 09/08/15 0145 09/08/15 0459 09/08/15 0809  GLUCAP 96 51* 30* 271* 126* 107*   Coagulation:  Recent Labs Lab 09/07/15 1816  LABPROT 27.4*  INR 2.58*   Urine Drug Screen: Drugs of Abuse     Component Value Date/Time   LABOPIA NONE DETECTED 07/16/2015 1400   LABOPIA NEG 09/18/2011 0936   COCAINSCRNUR NONE DETECTED 07/16/2015 1400   COCAINSCRNUR NEG 09/18/2011 0936   LABBENZ NONE DETECTED 07/16/2015 1400   LABBENZ NEG 09/18/2011 0936   LABBENZ NEG 04/10/2011 1130   AMPHETMU NONE DETECTED 07/16/2015 1400   AMPHETMU NEG 09/18/2011 0936   AMPHETMU NEG 04/10/2011 1130   THCU NONE DETECTED 07/16/2015 1400   THCU NEG 09/18/2011 0936   LABBARB NONE DETECTED 07/16/2015 1400   LABBARB NEG 09/18/2011 0936    Alcohol Level:  Recent Labs  Lab 09/08/15 0219  ETH <5    Studies/Results: Dg Chest 2 View  09/07/2015  CLINICAL DATA:  Hypoglycemia.  Lethargy after dialysis. EXAM: CHEST  2 VIEW COMPARISON:  08/23/2015 FINDINGS: Cardiomegaly and mediastinal contours are unchanged. There is a new opacity in the medial left lower lobe with left pleural effusion. Right lung is clear. No pulmonary edema. No pneumothorax. Chronic deformity of the left proximal humerus. The bones are under mineralized. IMPRESSION: 1. Medial left lung base opacity, concerning for pneumonia. Small associated left pleural effusion. Followup PA and lateral chest X-ray is recommended in 3-4 weeks following trial of antibiotic therapy to ensure resolution and exclude underlying malignancy. 2. Stable cardiomegaly. Electronically Signed   By: Jeb Levering M.D.   On: 09/07/2015 22:56   Medications: I have reviewed the patient's current medications. Scheduled Meds: . acidophilus  1 capsule Oral Daily  .  ceFEPime (MAXIPIME) IV  2 g Intravenous Q M,W,F-HD  . dextrose      . feeding supplement (NEPRO CARB STEADY)  237 mL Oral TID BM  . folic acid  1 mg Oral Daily  . levETIRAcetam  250 mg Oral Daily  . levETIRAcetam  500 mg Oral Q M,W,F-2000  . magnesium sulfate 1 - 4 g bolus IVPB  1 g Intravenous Once  . multivitamin with minerals  1 tablet Oral Daily  . ramelteon  8 mg Oral QHS  . sodium chloride flush  3 mL Intravenous Q12H  . sodium chloride flush  3 mL Intravenous Q12H  . thiamine  100 mg Oral Daily  . vitamin B-12  250 mcg Oral QPM   Continuous Infusions: . dextrose 10 mL/hr at 09/08/15 0500   PRN Meds:.sodium chloride, albuterol, camphor-menthol, loratadine, sodium chloride flush   Assessment/Plan: [name] is a 60 y.o. female w/ PMHx of SLE, ESRD, recurrent C. Dif, and multiple other co- morbidities, admitted for AMS, severe hypoglycemia, and pneumonia.   Hypoglycemia: Improved, most likely related to infection. AMS resolved. Patient is alert and oriented x3 this AM. No previous h/o DM, cortisol 13.5  -Continue D10 @ 10 cc/hr for now -Regular diet -Continue CBG's every 4 hours -ABx as below -Follow cultures -CBC, BMP in AM  Pneumonia: LLL pneumonia most prominent in the lateral view. Recent hospitalization, immunocompromised, needs coverage for MDR organisms. Given clinical appearance on exam, do not think patient requires Vancomycin for MRSA pneumonia coverage.   -Continue Cefepime for now -Follow blood cultures -Supplemental O2 prn  Recent Resistant C. Dif Infection: Discharged less than a week ago for this clinically improved, just followed with Dr. Storm Frisk office yesterday 09/07/15.  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 Recently had successful declot of her AVF by IR on 09/01/15  -Renal to see.   Protein and calorie malnutrition: Stable.  -Continue  feeding supplements -Regular diet  SLE: Azathioprine was recently d/c'ed due to worsening severe thrombocytopenia and normal dsDNA on last admission. Without evidence of a flare currently. -Restart hydroxychloroquine  Seizure disorder: Stable -Continue Keppra   Depression: Stable -Restart Prozac  Chronic Pancreatitis: Stable -Restart Creon  DVT/PE PPx: SCD's  Dispo: Disposition is deferred at this time, awaiting improvement of current medical problems.  Anticipated discharge in approximately 1-2 day(s).   The patient does have a current PCP Shela Leff, MD) and does need an [facility] hospital follow-up appointment after discharge.  The patient does have transportation limitations that  hinder transportation to clinic appointments.  .Services Needed at time of discharge: Y = Yes, Blank = No PT:   OT:   RN:   Equipment:   Other:       Corky Sox, MD 09/08/2015, 11:15 AM

## 2015-09-08 NOTE — Care Management Obs Status (Signed)
[place] NOTIFICATION   Patient Details  Name: NIYONNA MCFATE MRN: [id number] Date of Birth: [birth year]   Medicare Observation Status Notification Given:  Yes    Dawayne Patricia, RN 09/08/2015, 1:00 PM

## 2015-09-08 NOTE — Clinical Social Work Note (Signed)
Clinical Social Work Assessment  Patient Details  Name: [name] [name] MRN: [id number] Date of Birth: 10-12-1955  Date of referral:  09/08/15               Reason for consult:  Facility Placement, Discharge Planning                Permission sought to share information with:  Facility Sport and exercise psychologist, Family Supports Permission granted to share information::  Yes, Verbal Permission Granted  Name::     Acupuncturist::  [place]  Relationship::     Contact Information:  daughter  Housing/Transportation Living arrangements for the past 2 months:  [name], [name] of Information:  Patient Patient Interpreter Needed:  None Criminal Activity/Legal Involvement Pertinent to Current Situation/Hospitalization:  No - Comment as needed Significant Relationships:  Adult Children, Siblings Lives with:  Self Do you feel safe going back to the place where you live?  Yes Need for family participation in patient care:  Yes (Comment)  Care giving concern: The patient did not express any concerns. CSW also spoke with patient's daughter who does not express any concerns.   Social Worker assessment / plan:  CSW attempted to meet with the patient at bedside to complete assessment. The patient is difficult to awaken and is minimally engaged with CSW. CSW contacted patient's daughter [name] [name] to complete assessment. Per [name] [name] the patient is a short term rehab patient at [facility] and plans to return to the facility at discharge. CSW explained CSW role and process of getting the patient back to the facility when ready for discharge. CSW will continue to follow.  Employment status:  Disabled (Comment on whether or not currently receiving Disability) Insurance information:  Managed Medicare PT Recommendations:  Not assessed at this time Information / Referral to community resources:  [place]  Patient/Family's Response to care:   The patient and family have no complaints about the patient's care and appear to be satisfied.  Patient/Family's Understanding of and Emotional Response to Diagnosis, Current Treatment, and Prognosis:  The patient is difficult to assess at this time. The patient's daughter appears to have a fair understanding of the reason for the patient's admission and post DC needs.   Emotional Assessment Appearance:  Appears stated age Attitude/Demeanor/Rapport:  Other (Patient is very lethargic at this time, difficult to awaken. ) Affect (typically observed):  Unable to Assess (Agreed to let CSW contact family. ) Orientation:  Oriented to Self, Oriented to Place, Oriented to  Time, Oriented to Situation Alcohol / Substance use:  Alcohol Use Psych involvement (Current and /or in the community):  No (Comment)  Discharge Needs  Concerns to be addressed:  Discharge Planning Concerns Readmission within the last 30 days:  Yes Current discharge risk:  Chronically ill, Physical Impairment, Cognitively Impaired, Substance Abuse Barriers to Discharge:  Continued Medical Work up   Fredderick Phenix B, LCSW 09/08/2015, 5:08 PM

## 2015-09-08 NOTE — Progress Notes (Signed)
Pt did not have any hypoglycemic episodes this shift. Her blood glucose checks have been between 95 and 140. She has been very sleepy/ lethargic all shift, but arouses to tactile stimuli. Her appetite is very poor, eating only a few bites of her meals or refusing. Nutritional supplements offered, but pt also refuses. Nephro carb supplement changed to ensure, which pt prefers. Will offer her ensure when she returns from dialysis.

## 2015-09-08 NOTE — Progress Notes (Signed)
Pharmacy Antibiotic Note  [name] [name] is a 60 y.o. female admitted on 09/07/2015 with possible PNA.  Pharmacy has been consulted for Cefepime  Dosing.  ESRD with HD MWF  Plan: Cefepime 2 g IV now ,and after each HD    Temp (24hrs), Avg:97.8 F (36.6 C), Min:97.4 F (36.3 C), Max:98.1 F (36.7 C)   Recent Labs Lab 09/01/15 0450 09/01/15 0500 09/02/15 0545 09/07/15 1816  WBC  --  6.4 9.0 5.8  CREATININE 4.70*  --  3.82* 2.52*  LATICACIDVEN  --   --   --  2.22*    Estimated Creatinine Clearance: 14 mL/min (by C-G formula based on Cr of 2.52).    Allergies  Allergen Reactions  . Amitriptyline Hcl Swelling    In the face.  . Doxycycline Hyclate Itching    Feels like something crawling under her skin    Antimicrobials this admission: Cefepime 4/5 >>  Caryl Pina 09/08/2015 12:51 AM

## 2015-09-08 NOTE — Progress Notes (Signed)
Dialysis treatment completed.  892 mL ultrafiltrated and net fluid removal 292 mL.    Patient sleepy, aroused to voice. Lung sounds clear to ausculation in all fields. No edema. Cardiac: NSR.  Disconnected lines and removed needles.  Pressure held for 10 minutes and band aid/gauze dressing applied.  Report given to bedside RN, Parke Simmers.  Treatment discontinued with 2 hours and 17 minutes left due to infiltrated arterial needle.  Venous access was attempted three times, and this RN unable to continue treatment after arterial infiltration.  Physician notified and plan to dialyze patient in am on 4/6.  Will continue to monitor.

## 2015-09-08 NOTE — ED Notes (Signed)
Updated floor RN as to low blood sugar and orders.

## 2015-09-08 NOTE — Procedures (Signed)
  I was present at this dialysis session, have reviewed the session itself and made  appropriate changes Rob Yoshito Gaza MD [facility] pager [phone]    cell [phone] 09/08/2015, 4:47 PM    

## 2015-09-08 NOTE — Progress Notes (Addendum)
Initial Nutrition Assessment  DOCUMENTATION CODES:   Severe malnutrition in context of chronic illness, Underweight  INTERVENTION:   Chocolate Ensure Enlive po TID, each supplement provides 350 kcal and 20 grams of protein  NUTRITION DIAGNOSIS:   Malnutrition related to chronic illness as evidenced by severe depletion of body fat, severe depletion of muscle mass  GOAL:   Patient will meet greater than or equal to 90% of their needs  MONITOR:   PO intake, Supplement acceptance, Labs, Weight trends, Skin, I & O's  REASON FOR ASSESSMENT:   Consult Assessment of nutrition requirement/status  ASSESSMENT:   60 y.o. 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  Patient sleeping upon RD visit.   Seen per Clinical Nutrition during most recent hospitalization (last week). Pt with hx of very poor appetite.  Malnutrition ongoing. Patient enjoys chocolate Ensure Enlive supplements.  Nutrition-Focused physical exam completed 08/20/15. Findings were severe fat depletion, severe muscle depletion, and no edema.   Diet Order:  Diet regular Room service appropriate?: Yes; Fluid consistency:: Thin  Skin:  Wound (see comment) (Stage II to buttocks)  Last BM:  4/5  Height:   Ht Readings from Last 1 Encounters:  09/08/15 5\' 1"  (1.549 m)    Weight:   Wt Readings from Last 1 Encounters:  09/08/15 84 lb (38.102 kg)    Ideal Body Weight:  47.7 kg  BMI:  Body mass index is 15.88 kg/(m^2).  Estimated Nutritional Needs:   Kcal:  1300-1500  Protein:  60-75 gm  Fluid:  1200 ml  EDUCATION NEEDS:   No education needs identified at this time  Arthur Holms, RD, LDN Pager #: [phone] After-Hours Pager #: [phone]

## 2015-09-09 ENCOUNTER — Other Ambulatory Visit: Payer: Self-pay | Admitting: *Deleted

## 2015-09-09 DIAGNOSIS — D696 Thrombocytopenia, unspecified: Secondary | ICD-10-CM | POA: Diagnosis not present

## 2015-09-09 DIAGNOSIS — A047 Enterocolitis due to Clostridium difficile: Secondary | ICD-10-CM | POA: Diagnosis not present

## 2015-09-09 DIAGNOSIS — R54 Age-related physical debility: Secondary | ICD-10-CM | POA: Diagnosis present

## 2015-09-09 DIAGNOSIS — E781 Pure hyperglyceridemia: Secondary | ICD-10-CM | POA: Diagnosis present

## 2015-09-09 DIAGNOSIS — I132 Hypertensive heart and chronic kidney disease with heart failure and with stage 5 chronic kidney disease, or end stage renal disease: Secondary | ICD-10-CM | POA: Diagnosis present

## 2015-09-09 DIAGNOSIS — Y95 Nosocomial condition: Secondary | ICD-10-CM | POA: Diagnosis not present

## 2015-09-09 DIAGNOSIS — Z681 Body mass index (BMI) 19 or less, adult: Secondary | ICD-10-CM | POA: Diagnosis not present

## 2015-09-09 DIAGNOSIS — Z992 Dependence on renal dialysis: Secondary | ICD-10-CM | POA: Diagnosis not present

## 2015-09-09 DIAGNOSIS — F329 Major depressive disorder, single episode, unspecified: Secondary | ICD-10-CM

## 2015-09-09 DIAGNOSIS — K860 Alcohol-induced chronic pancreatitis: Secondary | ICD-10-CM

## 2015-09-09 DIAGNOSIS — E46 Unspecified protein-calorie malnutrition: Secondary | ICD-10-CM | POA: Diagnosis not present

## 2015-09-09 DIAGNOSIS — F445 Conversion disorder with seizures or convulsions: Secondary | ICD-10-CM | POA: Diagnosis not present

## 2015-09-09 DIAGNOSIS — D649 Anemia, unspecified: Secondary | ICD-10-CM | POA: Diagnosis not present

## 2015-09-09 DIAGNOSIS — R64 Cachexia: Secondary | ICD-10-CM | POA: Diagnosis present

## 2015-09-09 DIAGNOSIS — Z881 Allergy status to other antibiotic agents status: Secondary | ICD-10-CM | POA: Diagnosis not present

## 2015-09-09 DIAGNOSIS — J189 Pneumonia, unspecified organism: Secondary | ICD-10-CM | POA: Diagnosis not present

## 2015-09-09 DIAGNOSIS — F339 Major depressive disorder, recurrent, unspecified: Secondary | ICD-10-CM | POA: Diagnosis not present

## 2015-09-09 DIAGNOSIS — K861 Other chronic pancreatitis: Secondary | ICD-10-CM | POA: Diagnosis not present

## 2015-09-09 DIAGNOSIS — K219 Gastro-esophageal reflux disease without esophagitis: Secondary | ICD-10-CM | POA: Diagnosis not present

## 2015-09-09 DIAGNOSIS — D631 Anemia in chronic kidney disease: Secondary | ICD-10-CM | POA: Diagnosis not present

## 2015-09-09 DIAGNOSIS — R627 Adult failure to thrive: Secondary | ICD-10-CM | POA: Diagnosis present

## 2015-09-09 DIAGNOSIS — Z888 Allergy status to other drugs, medicaments and biological substances status: Secondary | ICD-10-CM | POA: Diagnosis not present

## 2015-09-09 DIAGNOSIS — Z79899 Other long term (current) drug therapy: Secondary | ICD-10-CM | POA: Diagnosis not present

## 2015-09-09 DIAGNOSIS — Z87891 Personal history of nicotine dependence: Secondary | ICD-10-CM | POA: Diagnosis not present

## 2015-09-09 DIAGNOSIS — E43 Unspecified severe protein-calorie malnutrition: Secondary | ICD-10-CM | POA: Diagnosis present

## 2015-09-09 DIAGNOSIS — E162 Hypoglycemia, unspecified: Secondary | ICD-10-CM | POA: Diagnosis not present

## 2015-09-09 DIAGNOSIS — K7030 Alcoholic cirrhosis of liver without ascites: Secondary | ICD-10-CM | POA: Diagnosis not present

## 2015-09-09 DIAGNOSIS — L932 Other local lupus erythematosus: Secondary | ICD-10-CM | POA: Diagnosis not present

## 2015-09-09 DIAGNOSIS — I5030 Unspecified diastolic (congestive) heart failure: Secondary | ICD-10-CM | POA: Diagnosis present

## 2015-09-09 DIAGNOSIS — Z515 Encounter for palliative care: Secondary | ICD-10-CM | POA: Diagnosis present

## 2015-09-09 DIAGNOSIS — K921 Melena: Secondary | ICD-10-CM | POA: Diagnosis not present

## 2015-09-09 DIAGNOSIS — N183 Chronic kidney disease, stage 3 (moderate): Secondary | ICD-10-CM | POA: Diagnosis present

## 2015-09-09 DIAGNOSIS — R040 Epistaxis: Secondary | ICD-10-CM | POA: Diagnosis not present

## 2015-09-09 DIAGNOSIS — G40909 Epilepsy, unspecified, not intractable, without status epilepticus: Secondary | ICD-10-CM | POA: Diagnosis present

## 2015-09-09 DIAGNOSIS — Z7189 Other specified counseling: Secondary | ICD-10-CM

## 2015-09-09 DIAGNOSIS — K7040 Alcoholic hepatic failure without coma: Secondary | ICD-10-CM | POA: Diagnosis present

## 2015-09-09 DIAGNOSIS — I120 Hypertensive chronic kidney disease with stage 5 chronic kidney disease or end stage renal disease: Secondary | ICD-10-CM | POA: Diagnosis not present

## 2015-09-09 DIAGNOSIS — K7290 Hepatic failure, unspecified without coma: Secondary | ICD-10-CM | POA: Diagnosis present

## 2015-09-09 DIAGNOSIS — Z66 Do not resuscitate: Secondary | ICD-10-CM | POA: Diagnosis not present

## 2015-09-09 DIAGNOSIS — M6281 Muscle weakness (generalized): Secondary | ICD-10-CM | POA: Diagnosis not present

## 2015-09-09 DIAGNOSIS — M329 Systemic lupus erythematosus, unspecified: Secondary | ICD-10-CM | POA: Diagnosis present

## 2015-09-09 DIAGNOSIS — N186 End stage renal disease: Secondary | ICD-10-CM | POA: Diagnosis not present

## 2015-09-09 DIAGNOSIS — N2581 Secondary hyperparathyroidism of renal origin: Secondary | ICD-10-CM | POA: Diagnosis present

## 2015-09-09 LAB — CBC
HCT: 28.2 % — ABNORMAL LOW (ref 36.0–46.0)
HEMATOCRIT: 24 % — AB (ref 36.0–46.0)
HEMOGLOBIN: 7.9 g/dL — AB (ref 12.0–15.0)
Hemoglobin: 8.9 g/dL — ABNORMAL LOW (ref 12.0–15.0)
MCH: 31.4 pg (ref 26.0–34.0)
MCH: 32.6 pg (ref 26.0–34.0)
MCHC: 31.6 g/dL (ref 30.0–36.0)
MCHC: 32.9 g/dL (ref 30.0–36.0)
MCV: 99.6 fL (ref 78.0–100.0)
MCV: 99.2 fL (ref 78.0–100.0)
PLATELETS: 18 10*3/uL — AB (ref 150–400)
Platelets: 14 10*3/uL — CL (ref 150–400)
RBC: 2.83 MIL/uL — AB (ref 3.87–5.11)
RBC: 2.42 MIL/uL — ABNORMAL LOW (ref 3.87–5.11)
RDW: 21.7 % — ABNORMAL HIGH (ref 11.5–15.5)
RDW: 21.7 % — ABNORMAL HIGH (ref 11.5–15.5)
WBC: 6.3 10*3/uL (ref 4.0–10.5)
WBC: 6.8 10*3/uL (ref 4.0–10.5)

## 2015-09-09 LAB — PROTIME-INR
INR: 2.86 — AB (ref 0.00–1.49)
Prothrombin Time: 29.5 seconds — ABNORMAL HIGH (ref 11.6–15.2)

## 2015-09-09 LAB — BASIC METABOLIC PANEL
Anion gap: 9 (ref 5–15)
CHLORIDE: 101 mmol/L (ref 101–111)
CO2: 24 mmol/L (ref 22–32)
CREATININE: 2.91 mg/dL — AB (ref 0.44–1.00)
Calcium: 7.7 mg/dL — ABNORMAL LOW (ref 8.9–10.3)
GFR calc Af Amer: 19 mL/min — ABNORMAL LOW (ref >60)
GFR, EST NON AFRICAN AMERICAN: 17 mL/min — AB (ref >60)
Glucose, Bld: 81 mg/dL (ref 65–99)
Potassium: 4.3 mmol/L (ref 3.5–5.1)
Sodium: 134 mmol/L — ABNORMAL LOW (ref 135–145)

## 2015-09-09 LAB — GLUCOSE, CAPILLARY
GLUCOSE-CAPILLARY: 72 mg/dL (ref 65–99)
GLUCOSE-CAPILLARY: 67 mg/dL (ref 65–99)
GLUCOSE-CAPILLARY: 75 mg/dL (ref 65–99)
Glucose-Capillary: 81 mg/dL (ref 65–99)
Glucose-Capillary: 77 mg/dL (ref 65–99)

## 2015-09-09 LAB — PHOSPHORUS: Phosphorus: 1.8 mg/dL — ABNORMAL LOW (ref 2.5–4.6)

## 2015-09-09 LAB — MAGNESIUM: Magnesium: 1.5 mg/dL — ABNORMAL LOW (ref 1.7–2.4)

## 2015-09-09 MED ORDER — GLUCOSE 40 % PO GEL
ORAL | Status: AC
  Administered 2015-09-09: 08:00:00
  Filled 2015-09-09: qty 1

## 2015-09-09 MED ORDER — DEXTROSE INFANT ORAL GEL 40%: 0.5000 mL/kg | ORAL | Status: AC | PRN

## 2015-09-09 MED ORDER — LEVETIRACETAM 500 MG/5ML IV SOLN
250.0000 mg | INTRAVENOUS | Status: AC
  Administered 2015-09-09: 250 mg via INTRAVENOUS
  Filled 2015-09-09 (×2): qty 2.5

## 2015-09-09 MED ORDER — DEXTROSE 50 % IV SOLN
1.0000 | Freq: Once | INTRAVENOUS | Status: AC
Start: 2015-09-09 — End: 2015-09-09
  Administered 2015-09-09: 50 mL via INTRAVENOUS

## 2015-09-09 MED ORDER — DEXTROSE 50 % IV SOLN
INTRAVENOUS | Status: AC
  Administered 2015-09-09: 50 mL via INTRAVENOUS
  Filled 2015-09-09: qty 50

## 2015-09-09 NOTE — Progress Notes (Signed)
Tx paged teaching service on call 1st contact after 5p; immediate return call - reviewed patient status of not taking anything [fluid/ Rx] by mouth today, only D10W @ 10 cc/ hour [that I turned up to 20 cc/ hr @ 1630] and concern that CBG's again trending down. Stated he would review.

## 2015-09-09 NOTE — Progress Notes (Signed)
Subjective: Patient lethargic and largely unresponsive this AM.  PM nurse reported that she was able to establish a new IV access w/ 24 g and had started her back on D10 @15  ml/hr.  Last CBG was 72 at 6 am.  Patient still not responding to IV D10, so was given buccal glucose.  Day nurse called to report increased responsiveness prior to her transport to hemodialysis facility.  Day nurse informed the nurse there to continue the buccal glucose should [name] [name] start to become more somnolent.  The primary team saw her in the dialysis facility and she was unresponsive again.  CBG was 62.  She was given buccal glucose and subcutaneous D50.    Objective: Vital signs in last 24 hours: Filed Vitals:   09/09/15 0415 09/09/15 0820 09/09/15 0835 09/09/15 0840  BP: 97/63  111/73 101/72  Pulse: 79  79 81  Temp: 97.7 F (36.5 C)     TempSrc: Oral     Resp: 16  10 11   Height:      Weight: 35.1 kg (77 lb 6.1 oz) 34.8 kg (76 lb 11.5 oz)    SpO2: 100%      Weight change: 0.298 kg (10.5 oz)  Intake/Output Summary (Last 24 hours) at 09/09/15 0854 Last data filed at 09/08/15 2000  Gross per 24 hour  Intake    340 ml  Output    292 ml  Net     48 ml   General appearance: cachectic and lethargic. Eyes closed. Head: Normocephalic, without obvious abnormality, atraumatic Eyes: unable to examine Lungs: clear to auscultation bilaterally Heart: regular rate and rhythm, S1, S2 normal and diastolic murmur: early diastolic 3/6, decrescendo at 2nd left intercostal space Abdomen: soft, non-tender; bowel sounds normal; no masses,  no organomegaly Extremities: extremities normal, atraumatic, no cyanosis or edema Pulses: 2+ and symmetric Neurologic: Mental status: alertness: lethargic, orientation: not oriented to person, place or time, affect: not responding to commands, opens eyes to pain Lab Results: Basic Metabolic Panel:  Recent Labs Lab 09/08/15 0220 09/08/15 0649 09/09/15 0254  NA  --  138 134*  K   --  4.6 4.3  CL  --  104 101  CO2  --  25 24  GLUCOSE  --  135* 81  BUN  --  <5* <5*  CREATININE  --  3.07* 2.91*  CALCIUM  --  7.8* 7.7*  MG 1.6*  --  1.5*  PHOS 1.8* 1.4* 1.8*   Liver Function Tests:  Recent Labs Lab 09/07/15 1816 09/08/15 0649  AST 90*  --   ALT 26  --   ALKPHOS 87  --   BILITOT 3.8*  --   PROT 6.6  --   ALBUMIN <1.0* <1.0*    Recent Labs Lab 09/07/15 1816  LIPASE 21    Recent Labs Lab 09/07/15 1825  AMMONIA 134*   CBC:  Recent Labs Lab 09/07/15 1816 09/08/15 0649 09/09/15 0254  WBC 5.8 7.8 6.3  NEUTROABS 3.6  --   --   HGB 8.9* 9.5* 8.9*  HCT 27.1* 28.6* 28.2*  MCV 101.5* 100.4* 99.6  PLT 37* 36* 18*   Cardiac Enzymes:  Recent Labs Lab 09/07/15 1816  TROPONINI <0.03   BNP: No results for input(s): PROBNP in the last 168 hours. D-Dimer: No results for input(s): DDIMER in the last 168 hours. CBG:  Recent Labs Lab 09/08/15 1532 09/08/15 1957 09/08/15 2252 09/08/15 2330 09/09/15 0410 09/09/15 0630  GLUCAP 140* 71 86 90  72 67   Hemoglobin A1C: No results for input(s): HGBA1C in the last 168 hours. Fasting Lipid Panel: No results for input(s): CHOL, HDL, LDLCALC, TRIG, CHOLHDL, LDLDIRECT in the last 168 hours. Thyroid Function Tests: No results for input(s): TSH, T4TOTAL, FREET4, T3FREE, THYROIDAB in the last 168 hours. Coagulation:  Recent Labs Lab 09/07/15 1816  LABPROT 27.4*  INR 2.58*   Anemia Panel: No results for input(s): VITAMINB12, FOLATE, FERRITIN, TIBC, IRON, RETICCTPCT in the last 168 hours. Urine Drug Screen: Drugs of Abuse     Component Value Date/Time   LABOPIA NONE DETECTED 07/16/2015 1400   LABOPIA NEG 09/18/2011 0936   COCAINSCRNUR NONE DETECTED 07/16/2015 1400   COCAINSCRNUR NEG 09/18/2011 0936   LABBENZ NONE DETECTED 07/16/2015 1400   LABBENZ NEG 09/18/2011 0936   LABBENZ NEG 04/10/2011 1130   AMPHETMU NONE DETECTED 07/16/2015 1400   AMPHETMU NEG 09/18/2011 0936   AMPHETMU NEG  04/10/2011 1130   THCU NONE DETECTED 07/16/2015 1400   THCU NEG 09/18/2011 0936   LABBARB NONE DETECTED 07/16/2015 1400   LABBARB NEG 09/18/2011 0936    Alcohol Level:  Recent Labs Lab 09/08/15 Mona <5   Urinalysis: No results for input(s): COLORURINE, LABSPEC, PHURINE, GLUCOSEU, HGBUR, BILIRUBINUR, KETONESUR, PROTEINUR, UROBILINOGEN, NITRITE, LEUKOCYTESUR in the last 168 hours.  Invalid input(s): APPERANCEUR   Micro Results: No results found for this or any previous visit (from the past 240 hour(s)). Studies/Results: Dg Chest 2 View  09/07/2015  CLINICAL DATA:  Hypoglycemia.  Lethargy after dialysis. EXAM: CHEST  2 VIEW COMPARISON:  08/23/2015 FINDINGS: Cardiomegaly and mediastinal contours are unchanged. There is a new opacity in the medial left lower lobe with left pleural effusion. Right lung is clear. No pulmonary edema. No pneumothorax. Chronic deformity of the left proximal humerus. The bones are under mineralized. IMPRESSION: 1. Medial left lung base opacity, concerning for pneumonia. Small associated left pleural effusion. Followup PA and lateral chest X-ray is recommended in 3-4 weeks following trial of antibiotic therapy to ensure resolution and exclude underlying malignancy. 2. Stable cardiomegaly. Electronically Signed   By: Jeb Levering M.D.   On: 09/07/2015 22:56   Medications: I have reviewed the patient's current medications. Scheduled Meds: . acidophilus  1 capsule Oral Daily  . ceFEPime (MAXIPIME) IV  2 g Intravenous Q M,W,F-HD  . [START ON 09/15/2015] darbepoetin (ARANESP) injection - DIALYSIS  200 mcg Intravenous Q Wed-HD  . feeding supplement (ENSURE ENLIVE)  237 mL Oral TID BM  . FLUoxetine  10 mg Oral Daily  . folic acid  1 mg Oral Daily  . hydroxychloroquine  400 mg Oral Daily  . levETIRAcetam  250 mg Oral Daily  . levETIRAcetam  500 mg Oral Q M,W,F-2000  . lipase/protease/amylase  12,000 Units Oral TID AC  . magnesium oxide  400 mg Oral Daily  .  magnesium sulfate 1 - 4 g bolus IVPB  1 g Intravenous Once  . multivitamin with minerals  1 tablet Oral Daily  . pantoprazole  40 mg Oral Daily  . ramelteon  8 mg Oral QHS  . sodium chloride flush  3 mL Intravenous Q12H  . sodium chloride flush  3 mL Intravenous Q12H  . thiamine  100 mg Oral Daily  . vitamin B-12  250 mcg Oral QPM   Continuous Infusions: . dextrose 10 mL/hr at 09/08/15 2000   PRN Meds:.sodium chloride, albuterol, camphor-menthol, loratadine, sodium chloride flush Assessment/Plan: Active Problems:   Hypoglycemia  [name] [name] is a 60 y.o.  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 Now with worsening thrombocytopenia, and continued periodic hypoglycemia manifested by lethargy and nonresponsiveness.   Community acquired pneumonia: Patient presented with hypoglycemia and fatigue and weakness. Had a blood glucose of 28 after HD on 09/07/2015. CXR was significant for RLL pulmonary infiltrate concerning for pneumonia, which could be the underlying cause of her hypoglycemia. Unlikely to be MRSA at this time, but will obtain MRSA swab. Will start broad spectrum coverage for gram negative bacteria with cefepime. Cefepime also has some coverage for atypical species, since she is allergic to doxycycline, and had a prolonged QT interval, which is a contraindication to azithromycin.  Still no systemic signs of infection including fever or leukocytosis.  Will consider stopping antibiotics since her hypoglycemia is continuing despite broad spectrum antibiotics and no signs of infection, making a pneumonia a less-likely cause of her hypoglycemia.   --Continue cefepime, 2g IV @100  mL/hr on MWF, last done on first dose on 4/4, 2nd dose on 4/5,  --Pharmacy consult for cefepime management in ESRD patient --Trend CBC,  BMP  Hypoglycemia: Likely due to poor oral intake and liver failure. Unlikely to be 2/2 EtOH as her BAC was undetectable. Adrenal insufficiency was ruled out with a normal morning cortisol level.Improved with D50 and PO intake.  However, her IV access was lost for a time and her CBGs decreased during this time.  IV access has now been restored and she is getting buccal glucose PRN for low glucose levels.   --Increase D10 to 15 ml/hr; diet and feeding supplements as tolerated with juice/D50/buccal glucose PRN for low CBG. --Restarted Creon, Prozac, since glucose has improved somewhat.  Continuing to hold gabapentin.    AMS: Had resolved, but today she has had periods of lethargy and unresponsiveness.  Likely 2/2 hypoglycemia.  No asterixis or other clinical signs of hepatic encephalopathy. But consider checking ammonia levels if glucose does not improve level of consciousness.  Also need to consider CNS causes at this time since her platelets now <20K, so she has an increased risk of an intracranial bleed.   --Treat hypoglycemia as outlined above  Thrombocytopenia: Patient has a history of thrombocytopenia.  During her last hospitalization her levels decreased to 16-18K.  Today her platelets were 18K, down from 36K yesterday. Could be related to medications, or ITP given her lupus.  A less likely cause is splenic sequestration given her advanced liver disease.  Need to have better access in case she requires platelet transfusion.   --Consult IR for options for access --Monitor for signs of CNS dysfunction or intracerebral hemorrhage.   Advanced Care Planning: Ms. Sanjose has multiple chronic co-morbid conditions that seem to be working against each other at this time.  In the past 15 months she has had 10 hospitalizations, and in the past 6 months she has had extended hospital admissions each month.  During her last hospital stay nephrology recommended palliative care consultation.  At this point  in time we agree that a family discussion of end of life goals and care be initiated.  Today we will set this up with her family members.  We will consult palliative care pending the results of that discussion. --Contact husband/daughter  Recent resistant C.dif infection - discharged less than a week ago for this clinically improved, just followed with Dr. 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 Recently had successful declot of her AVF by IR on 09/01/15  -Nephology following; will decide on HD on 4/5 vs 4/6.   Protein and calorie malnutrition --Continue feeding supplements --Hold mirtazapine   SLE - azathioprine was recently d/c'ed due to worsening severe thrombocytopenia and normal dsDNA on last admission. Without evidence of a flare currently. -Continue hydroxychloroquine  Seizure disorder - denies recent seizure activity -Continue keppra   Dispo: Disposition is deferred at this time, awaiting improvement of current medical problems. Anticipated discharge in approximately 1-3 day(s).  This is a Careers information officer Note.  The care of the patient was discussed with Dr. Lynnae January and the assessment and plan formulated with their assistance.  Please see their attached note for official documentation of the daily encounter.     [place], Med Student 09/09/2015, 8:54 AM

## 2015-09-09 NOTE — Progress Notes (Signed)
Dr. Lovena Le responded to this RN's page - informed him patient has returned from dialysis and the family has many questions, want to speak to the doctor, "We want to know what is going on..." stated by daughter very firm agitated voice. Stated he and his team would arrive to speak to the family within the hour.

## 2015-09-09 NOTE — Patient Outreach (Signed)
[facility]) Care Management  [place]  09/09/2015   KIASIA PELKY 16-Jan-1956 [id number]  Subjective:  Patient is a 60yo admitted to [facility] for rehab.  Objective:   Encounter Medications:  Facility-Administered Encounter Medications as of 09/09/2015  Medication  . 0.9 %  sodium chloride infusion  . acidophilus (RISAQUAD) capsule 1 capsule  . albuterol (PROVENTIL) (2.5 MG/3ML) 0.083% nebulizer solution 2.5 mg  . camphor-menthol (SARNA) lotion 1 application  . ceFEPIme (MAXIPIME) 2 g in dextrose 5 % 50 mL IVPB  . [START ON 09/15/2015] Darbepoetin Alfa (ARANESP) injection 200 mcg  . dextrose 10 % infusion  . feeding supplement (ENSURE ENLIVE) (ENSURE ENLIVE) liquid 237 mL  . FLUoxetine (PROZAC) capsule 10 mg  . folic acid (FOLVITE) tablet 1 mg  . hydroxychloroquine (PLAQUENIL) tablet 400 mg  . levETIRAcetam (KEPPRA) tablet 250 mg  . levETIRAcetam (KEPPRA) tablet 500 mg  . lipase/protease/amylase (CREON) capsule 12,000 Units  . loratadine (CLARITIN) tablet 10 mg  . magnesium oxide (MAG-OX) tablet 400 mg  . magnesium sulfate IVPB 1 g 100 mL  . multivitamin with minerals tablet 1 tablet  . pantoprazole (PROTONIX) EC tablet 40 mg  . ramelteon (ROZEREM) tablet 8 mg  . sodium chloride flush (NS) 0.9 % injection 3 mL  . sodium chloride flush (NS) 0.9 % injection 3 mL  . sodium chloride flush (NS) 0.9 % injection 3 mL  . thiamine (VITAMIN B-1) tablet 100 mg  . vitamin B-12 (CYANOCOBALAMIN) tablet 250 mcg   Outpatient Encounter Prescriptions as of 09/09/2015  Medication Sig  . albuterol (PROAIR HFA) 108 (90 BASE) MCG/ACT inhaler Inhale 1-2 puffs into the lungs every 6 (six) hours as needed for wheezing or shortness of breath.  . azaTHIOprine (IMURAN) 50 MG tablet Take 50 mg by mouth daily.  . calcium carbonate (TUMS - DOSED IN MG ELEMENTAL CALCIUM) 500 MG chewable tablet Chew 1,000 tablets by mouth at bedtime.  . camphor-menthol (SARNA) lotion Apply 1  application topically daily as needed for itching.  . cetirizine (ZYRTEC) 10 MG tablet Take 1 tablet (10 mg total) by mouth daily as needed for allergies.  Marland Kitchen CREON 12000 UNITS CPEP capsule Take 1 capsule (12,000 Units total) by mouth 3 (three) times daily before meals.  . Darbepoetin Alfa (ARANESP) 150 MCG/0.3ML SOSY injection Inject 0.3 mLs (150 mcg total) into the vein every Wednesday with hemodialysis.  . feeding supplement, ENSURE ENLIVE, (ENSURE ENLIVE) LIQD Take 237 mLs by mouth 3 (three) times daily with meals.  Marland Kitchen FLUoxetine (PROZAC) 10 MG capsule Take 1 capsule (10 mg total) by mouth daily. For depression  . folic acid (FOLVITE) 1 MG tablet Take 1 tablet (1 mg total) by mouth daily. For folic acid replacement  . gabapentin (NEURONTIN) 300 MG capsule Take 1 capsule (300 mg total) by mouth daily.  . hydroxychloroquine (PLAQUENIL) 200 MG tablet Take 400 mg by mouth daily.  . Lactobacillus (ACIDOPHILUS PROBIOTIC PO) Take 2 mg by mouth 3 (three) times daily. Take 2 (two) tablets by mouth three times daily  . levETIRAcetam (KEPPRA) 250 MG tablet Take 1 tablet (250 mg total) by mouth daily. Take additional 500mg  (2 tablets) after Dialysis treatments on Monday, Wednesday, Friday  . loperamide (IMODIUM) 2 MG capsule Take 2 mg by mouth every 6 (six) hours as needed for diarrhea or loose stools.  . mirtazapine (REMERON) 7.5 MG tablet Take 1 tablet (7.5 mg total) by mouth at bedtime.  . Multiple Vitamin (MULTIVITAMIN WITH MINERALS) TABS  tablet Take 1 tablet by mouth daily. For vitamin replacement  . nystatin (MYCOSTATIN) 100000 UNIT/ML suspension Use as directed 5 mLs in the mouth or throat 4 (four) times daily.  Marland Kitchen omeprazole (PRILOSEC) 40 MG capsule Take 1 capsule (40 mg total) by mouth daily.  Marland Kitchen oxyCODONE (OXY IR/ROXICODONE) 5 MG immediate release tablet Take 5 mg by mouth every 6 (six) hours as needed for moderate pain or severe pain.  . ramelteon (ROZEREM) 8 MG tablet Take 8 mg by mouth at bedtime.   . thiamine (VITAMIN B-1) 100 MG tablet Take 1 tablet (100 mg total) by mouth daily. For low thiamine  . vitamin B-12 (CYANOCOBALAMIN) 250 MCG tablet Take 1 tablet (250 mcg total) by mouth every evening.    Functional Status:  In your present state of health, do you have any difficulty performing the following activities: 09/08/2015 08/23/2015  Hearing? N N  Vision? N N  Difficulty concentrating or making decisions? Tempie Donning  Walking or climbing stairs? Y Y  Dressing or bathing? Y Y  Doing errands, shopping? Y -    Fall/Depression Screening: PHQ 2/9 Scores 07/13/2015 03/02/2015 02/11/2015 01/21/2015 10/09/2014 09/30/2014 07/23/2014  PHQ - 2 Score 0 0 0 0 1 0 0  PHQ- 9 Score - - - - - - -    Assessment:  CSW arrived at [facility] and spoke with Santiago Glad, Education officer, museum, who reports patient was admitted to hospital. CSW will follow up next week to see when she returns to reschedule.  Plan: CSW will call [facility] next week.    Eduard Clos, MSW, Combine Worker  [place] [phone]

## 2015-09-09 NOTE — Progress Notes (Signed)
Subjective:  Barely awake, doesn't interact verbally  Objective Vital signs in last 24 hours: Filed Vitals:   09/09/15 0415 09/09/15 0820 09/09/15 0835 09/09/15 0840  BP: 97/63 127/76 111/73 101/72  Pulse: 79 79 79 81  Temp: 97.7 F (36.5 C) 97.9 F (36.6 C)    TempSrc: Oral Oral    Resp: 16 11 10 11   Height:      Weight: 35.1 kg (77 lb 6.1 oz) 34.8 kg (76 lb 11.5 oz)    SpO2: 100% 100%     Weight change: 0.298 kg (10.5 oz)  Physical Exam: General: lethargic, not responding verbally, nods her head Heart: RRR, no mur,rub Lungs: R cTA / L base Rales Abdomen: soft, nt, Nd, Incres BS Extremities: Dialysis Access: LUA AVF pos bruit no edema Neuro= none focal / Gen weakness   [facility]ialysis: [facility]    3.5h   34.5kg   4/2.25 bath   Hep 2000  L arm AVG Mircera 200 q 2 wks next 3/1 Calcitriol 1.25  On hold sec to ^ ca Recent labs: last hosp hgb 9.4 Fe 86 no tsat ferritin 1684 iPTH 298 Ca 7.8 Phos 2.9 Alb <1.0  Assessment: 1. ESRD - HD TTS 2. Hypoglycemia- per admit 3. PNA LLL - on abx 4. Encephalopathy-  Multifactorial 5. FTT - for pall care meeting today 6. HTN/volume - no meds at dry wt 7. Anemia of ESRD - hgb 9.5 aranesp q wed hd   8. Secondary hyperparathyroidism - low phos 1.4/ No binder/ ^ ca hld po vit d / on regular diet  9. Protein / Calorie Malnutrition -  Alb<1.0 - admit team rx  reg diet/ protein supplements/ renal vit.  10. SLE- On meds per admit =hydroxychloroquine 11. Depression  12. Recent CDif infection - on acidophilus probiotic   Plan - HD today, no fluid off  Kelly Splinter MD [facility] pager [phone]    cell [phone] 09/09/2015, 9:36 AM       Labs: Basic Metabolic Panel:  Recent Labs Lab 09/07/15 1816 09/08/15 0220 09/08/15 0649 09/09/15 0254  NA 133*  --  138 134*  K 4.0  --  4.6 4.3  CL 101  --  104 101  CO2 23  --  25 24  GLUCOSE 264*  --  135* 81  BUN <5*  --  <5* <5*  CREATININE 2.52*  --  3.07* 2.91*  CALCIUM  7.3*  --  7.8* 7.7*  PHOS  --  1.8* 1.4* 1.8*   Liver Function Tests:  Recent Labs Lab 09/07/15 1816 09/08/15 0649  AST 90*  --   ALT 26  --   ALKPHOS 87  --   BILITOT 3.8*  --   PROT 6.6  --   ALBUMIN <1.0* <1.0*    Recent Labs Lab 09/07/15 1816  LIPASE 21    Recent Labs Lab 09/07/15 1825  AMMONIA 134*   CBC:  Recent Labs Lab 09/07/15 1816 09/08/15 0649 09/09/15 0254  WBC 5.8 7.8 6.3  NEUTROABS 3.6  --   --   HGB 8.9* 9.5* 8.9*  HCT 27.1* 28.6* 28.2*  MCV 101.5* 100.4* 99.6  PLT 37* 36* 18*   Cardiac Enzymes:  Recent Labs Lab 09/07/15 1816  TROPONINI <0.03   CBG:  Recent Labs Lab 09/08/15 1957 09/08/15 2252 09/08/15 2330 09/09/15 0410 09/09/15 0630  GLUCAP 71 86 90 72 67    Studies/Results: Dg Chest 2 View  09/07/2015  CLINICAL DATA:  Hypoglycemia.  Lethargy after dialysis. EXAM: CHEST  2 VIEW COMPARISON:  08/23/2015 FINDINGS: Cardiomegaly and mediastinal contours are unchanged. There is a new opacity in the medial left lower lobe with left pleural effusion. Right lung is clear. No pulmonary edema. No pneumothorax. Chronic deformity of the left proximal humerus. The bones are under mineralized. IMPRESSION: 1. Medial left lung base opacity, concerning for pneumonia. Small associated left pleural effusion. Followup PA and lateral chest X-ray is recommended in 3-4 weeks following trial of antibiotic therapy to ensure resolution and exclude underlying malignancy. 2. Stable cardiomegaly. Electronically Signed   By: Jeb Levering M.D.   On: 09/07/2015 22:56   Medications: . dextrose 10 mL/hr at 09/08/15 2000   . acidophilus  1 capsule Oral Daily  . ceFEPime (MAXIPIME) IV  2 g Intravenous Q M,W,F-HD  . [START ON 09/15/2015] darbepoetin (ARANESP) injection - DIALYSIS  200 mcg Intravenous Q Wed-HD  . feeding supplement (ENSURE ENLIVE)  237 mL Oral TID BM  . FLUoxetine  10 mg Oral Daily  . folic acid  1 mg Oral Daily  . hydroxychloroquine  400 mg Oral  Daily  . levETIRAcetam  250 mg Oral Daily  . levETIRAcetam  500 mg Oral Q M,W,F-2000  . lipase/protease/amylase  12,000 Units Oral TID AC  . magnesium oxide  400 mg Oral Daily  . magnesium sulfate 1 - 4 g bolus IVPB  1 g Intravenous Once  . multivitamin with minerals  1 tablet Oral Daily  . pantoprazole  40 mg Oral Daily  . ramelteon  8 mg Oral QHS  . sodium chloride flush  3 mL Intravenous Q12H  . sodium chloride flush  3 mL Intravenous Q12H  . thiamine  100 mg Oral Daily  . vitamin B-12  250 mcg Oral QPM

## 2015-09-09 NOTE — Progress Notes (Signed)
Subjective: Very drowsy this morning. Able to arouse but did not want to interact. CBG 67 this AM.   Objective: Vital signs in last 24 hours: Filed Vitals:   09/08/15 1750 09/08/15 2019 09/08/15 2032 09/09/15 0415  BP: 101/71 96/52 91/64  97/63  Pulse: 92  83 79  Temp:   97.9 F (36.6 C) 97.7 F (36.5 C)  TempSrc:   Oral Oral  Resp:   18 16  Height:      Weight:    77 lb 6.1 oz (35.1 kg)  SpO2:   100% 100%   Weight change: 10.5 oz (0.298 kg)  Intake/Output Summary (Last 24 hours) at 09/09/15 0839 Last data filed at 09/08/15 2000  Gross per 24 hour  Intake    340 ml  Output    292 ml  Net     48 ml   Physical Exam: General: Cachectic appearing woman, drowsy but able to arouse HEENT: EOMI, mucus membranes moist  CV: RRR, 2/4 diastolic murmur  Lungs: CTA bilaterally, breaths non-labored  Abdomen: BS+, soft, non-tender  Ext: LUE AVF, good thrill and bruit. No peripheral edema.  Neurologic: Drowsy, would only respond with a few words. Opened eyes to voice.   Lab Results: Basic Metabolic Panel:  Recent Labs Lab 09/08/15 0220 09/08/15 0649 09/09/15 0254  NA  --  138 134*  K  --  4.6 4.3  CL  --  104 101  CO2  --  25 24  GLUCOSE  --  135* 81  BUN  --  <5* <5*  CREATININE  --  3.07* 2.91*  CALCIUM  --  7.8* 7.7*  MG 1.6*  --  1.5*  PHOS 1.8* 1.4* 1.8*   Liver Function Tests:  Recent Labs Lab 09/07/15 1816 09/08/15 0649  AST 90*  --   ALT 26  --   ALKPHOS 87  --   BILITOT 3.8*  --   PROT 6.6  --   ALBUMIN <1.0* <1.0*    Recent Labs Lab 09/07/15 1816  LIPASE 21    Recent Labs Lab 09/07/15 1825  AMMONIA 134*   CBC:  Recent Labs Lab 09/07/15 1816 09/08/15 0649 09/09/15 0254  WBC 5.8 7.8 6.3  NEUTROABS 3.6  --   --   HGB 8.9* 9.5* 8.9*  HCT 27.1* 28.6* 28.2*  MCV 101.5* 100.4* 99.6  PLT 37* 36* 18*   Cardiac Enzymes:  Recent Labs Lab 09/07/15 1816  TROPONINI <0.03   CBG:  Recent Labs Lab 09/08/15 1532 09/08/15 1957  09/08/15 2252 09/08/15 2330 09/09/15 0410 09/09/15 0630  GLUCAP 140* 71 86 90 72 67   Coagulation:  Recent Labs Lab 09/07/15 1816  LABPROT 27.4*  INR 2.58*   Urine Drug Screen: Drugs of Abuse     Component Value Date/Time   LABOPIA NONE DETECTED 07/16/2015 1400   LABOPIA NEG 09/18/2011 0936   COCAINSCRNUR NONE DETECTED 07/16/2015 1400   COCAINSCRNUR NEG 09/18/2011 0936   LABBENZ NONE DETECTED 07/16/2015 1400   LABBENZ NEG 09/18/2011 0936   LABBENZ NEG 04/10/2011 1130   AMPHETMU NONE DETECTED 07/16/2015 1400   AMPHETMU NEG 09/18/2011 0936   AMPHETMU NEG 04/10/2011 1130   THCU NONE DETECTED 07/16/2015 1400   THCU NEG 09/18/2011 0936   LABBARB NONE DETECTED 07/16/2015 1400   LABBARB NEG 09/18/2011 0936    Alcohol Level:  Recent Labs Lab 09/08/15 0219  ETH <5    Studies/Results: Dg Chest 2 View  09/07/2015  CLINICAL DATA:  Hypoglycemia.  Lethargy after dialysis. EXAM: CHEST  2 VIEW COMPARISON:  08/23/2015 FINDINGS: Cardiomegaly and mediastinal contours are unchanged. There is a new opacity in the medial left lower lobe with left pleural effusion. Right lung is clear. No pulmonary edema. No pneumothorax. Chronic deformity of the left proximal humerus. The bones are under mineralized. IMPRESSION: 1. Medial left lung base opacity, concerning for pneumonia. Small associated left pleural effusion. Followup PA and lateral chest X-ray is recommended in 3-4 weeks following trial of antibiotic therapy to ensure resolution and exclude underlying malignancy. 2. Stable cardiomegaly. Electronically Signed   By: Jeb Levering M.D.   On: 09/07/2015 22:56   Medications: I have reviewed the patient's current medications. Scheduled Meds: . acidophilus  1 capsule Oral Daily  . ceFEPime (MAXIPIME) IV  2 g Intravenous Q M,W,F-HD  . [START ON 09/15/2015] darbepoetin (ARANESP) injection - DIALYSIS  200 mcg Intravenous Q Wed-HD  . feeding supplement (ENSURE ENLIVE)  237 mL Oral TID BM  .  FLUoxetine  10 mg Oral Daily  . folic acid  1 mg Oral Daily  . hydroxychloroquine  400 mg Oral Daily  . levETIRAcetam  250 mg Oral Daily  . levETIRAcetam  500 mg Oral Q M,W,F-2000  . lipase/protease/amylase  12,000 Units Oral TID AC  . magnesium oxide  400 mg Oral Daily  . magnesium sulfate 1 - 4 g bolus IVPB  1 g Intravenous Once  . multivitamin with minerals  1 tablet Oral Daily  . pantoprazole  40 mg Oral Daily  . ramelteon  8 mg Oral QHS  . sodium chloride flush  3 mL Intravenous Q12H  . sodium chloride flush  3 mL Intravenous Q12H  . thiamine  100 mg Oral Daily  . vitamin B-12  250 mcg Oral QPM   Continuous Infusions: . dextrose 10 mL/hr at 09/08/15 2000   PRN Meds:.sodium chloride, albuterol, camphor-menthol, loratadine, sodium chloride flush   Assessment/Plan: [name] is a 60yo woman w/ PMHx of SLE, ESRD, recurrent C. Dif, and multiple other co- morbidities, admitted for AMS, severe hypoglycemia, and pneumonia.   Hypoglycemia: Still having mild hypoglycemia this morning. She responded to oral glucose gel. Most likely related to infection. AMS resolved. No previous h/o DM, cortisol 13.5. Will discuss getting IV access with IR since she is a hard stick if nursing was not able to get peripheral access.  - Continue D10 @ 10 cc/hr if able. Use oral glucose gel if needed. - Regular diet - Continue CBG's every 4 hours - ABx as below - Follow cultures - CBC, BMP in AM   HCAP: LLL pneumonia most prominent in the lateral view. Recent hospitalization, immunocompromised, needs coverage for MDR organisms. Given clinical appearance on exam, do not think patient requires Vancomycin for MRSA pneumonia coverage.   - Continue Cefepime for now - Follow blood cultures - Supplemental O2 prn  Chronic Thrombocytopenia: Platelets 37,000 on admission, today dropped to 18,000. Her thrombocytopenia is likely multifactorial in etiology, including cirrhosis, SLE (was on azathioprine, but stopped  last admission), infection. Last hospitalization her platelets were thought to drop due to Vancomycin. Would avoid Vancomycin use.  - Continue to monitor closely for bleeding - CBC in AM  Recent Resistant C. Diff Infection: Discharged less than a week ago for this clinically improved, just followed with Dr. Storm Frisk office 09/07/15.  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 Recently had successful declot  of her AVF by IR on 09/01/15  - Renal following, appreciate recommendations - Getting HD today   Protein and calorie malnutrition: Stable.  - Continue feeding supplements - Regular diet  SLE: Azathioprine was recently d/c'ed due to worsening severe thrombocytopenia and normal dsDNA on last admission. Without evidence of a flare currently. - Continue hydroxychloroquine  Seizure disorder: Stable - Continue Keppra   Depression: Stable - Continue Prozac  Chronic Pancreatitis: Stable - Continue Creon  Advanced Care Discussion: Her health has significantly declined over the past year and she has had 4 hospital admissions in the last 6 months. I think with her multiple medical problems it would be appropriate to have a discussion of her goals of care going forward. We will bring this issue up with the patient when her clinical status improves and she is able to contribute to the discussion.   Diet: Regular  DVT/PE PPx: SCD's Dispo: Disposition is deferred at this time, awaiting improvement of current medical problems.  Anticipated discharge in approximately 1-2 day(s).   The patient does have a current PCP Shela Leff, MD) and does need an [facility] hospital follow-up appointment after discharge.  The patient does have transportation limitations that hinder transportation to clinic appointments.  .Services Needed at time of discharge: Y = Yes, Blank = No PT:   OT:   RN:     Equipment:   Other:       [name] Rude, MD 09/09/2015, 8:39 AM

## 2015-09-09 NOTE — Progress Notes (Signed)
  Date: 09/09/2015  Patient name: [name] [name]  Medical record number: [id number]  Date of birth: 19-Nov-1955   This patient has been seen and the plan of care was discussed with the house staff. Please see their note for complete details. I concur with their findings with the following additions/corrections: [name] was seen in HD today. She was minimally responsive. Would open eyes briefly, nod head, not following commands but moving all 4 ext. I agree with [address] that we cannot cure or fix her underlying issues and overall, her health has declined despite maximal interventions. For family discussion today with GOC.  Bartholomew Crews, MD 09/09/2015, 2:27 PM

## 2015-09-09 NOTE — Progress Notes (Addendum)
This RN discussed with Dr. Venetia Maxon night RN's concern r/t lack of IV access over night, low CBG reading, will/ should  she be going to dialysis, and palliative care. MD stated team had discussed possibility of discussing palliative care with the family and interventional radiology to provide central line access and that dialysis would benefit patient with removal of toxins, etc. MD instructed to apply glucose gel inside cheek - RN applied 1/2 tube - patient swinging arms at this RN. Transport arrived to transport to dialysis. Called RN receiving in dialysis, updated on all of the above. Requested recheck of CBG and that 1/2 tube of glucose was administered shortly before 0800. Called and updated Dr. Venetia Maxon regarding response to glucose gel and communication with dialysis RN.

## 2015-09-09 NOTE — Progress Notes (Signed)
CRITICAL VALUE ALERT  Critical value received:  PLT 18  Date of notification:  09/09/2015 Time of notification:  [id number]  Critical value read back:YES  Nurse who received alert:  A. Pincus Large RN  MD notified (1st page):  DR. Merrilyn Puma  Time of first page:  0515  MD notified (2nd page):N/A  Time of second page:N/A  Responding MD:  DR. Merrilyn Puma  Time MD responded: [phone]

## 2015-09-09 NOTE — Progress Notes (Signed)
Paged Internal Medicine Teaching service, Wille Celeste, MS4 - returned page. This RN shared continued observation that the patient not taking fluids/ medications by mouth, IV of D10W continues @ 15 cc/ hr, unable to deliver fluids of Rx's by mouth including Keppra; inquired as to code status. Street shared the plan is to talk with the family tomorrow regarding palliative care and that the team will review/ adjust Rx orders regard route of delivery.

## 2015-09-10 LAB — GLUCOSE, CAPILLARY
GLUCOSE-CAPILLARY: 64 mg/dL — AB (ref 65–99)
GLUCOSE-CAPILLARY: 71 mg/dL (ref 65–99)
GLUCOSE-CAPILLARY: 89 mg/dL (ref 65–99)
Glucose-Capillary: 113 mg/dL — ABNORMAL HIGH (ref 65–99)
Glucose-Capillary: 120 mg/dL — ABNORMAL HIGH (ref 65–99)
Glucose-Capillary: 121 mg/dL — ABNORMAL HIGH (ref 65–99)
Glucose-Capillary: 80 mg/dL (ref 65–99)
Glucose-Capillary: 80 mg/dL (ref 65–99)
Glucose-Capillary: 88 mg/dL (ref 65–99)
Glucose-Capillary: 94 mg/dL (ref 65–99)

## 2015-09-10 MED ORDER — PHYTONADIONE 5 MG PO TABS
5.0000 mg | ORAL_TABLET | Freq: Once | ORAL | Status: DC
  Filled 2015-09-10: qty 1

## 2015-09-10 MED ORDER — VITAMIN K1 10 MG/ML IJ SOLN
2.5000 mg | Freq: Once | INTRAVENOUS | Status: AC
  Administered 2015-09-10: 2.5 mg via INTRAVENOUS
  Filled 2015-09-10: qty 0.25

## 2015-09-10 MED ORDER — DEXTROSE 5 % IV SOLN
2.0000 g | INTRAVENOUS | Status: AC
  Administered 2015-09-11: 2 g via INTRAVENOUS
  Filled 2015-09-10 (×2): qty 2

## 2015-09-10 MED ORDER — PANTOPRAZOLE SODIUM 40 MG IV SOLR
40.0000 mg | INTRAVENOUS | Status: DC
  Administered 2015-09-10 – 2015-09-12 (×3): 40 mg via INTRAVENOUS
  Filled 2015-09-10 (×3): qty 40

## 2015-09-10 MED ORDER — PANTOPRAZOLE SODIUM 40 MG IV SOLR
40.0000 mg | Freq: Once | INTRAVENOUS | Status: AC
  Administered 2015-09-10: 40 mg via INTRAVENOUS
  Filled 2015-09-10: qty 40

## 2015-09-10 MED ORDER — CEFEPIME HCL 1 G IJ SOLR
1.0000 g | Freq: Once | INTRAMUSCULAR | Status: AC
  Administered 2015-09-10: 1 g via INTRAVENOUS
  Filled 2015-09-10: qty 1

## 2015-09-10 MED ORDER — DEXTROSE 5 % IV SOLN
2.0000 g | INTRAVENOUS | Status: DC
Start: 2015-09-13 — End: 2015-09-12

## 2015-09-10 NOTE — Progress Notes (Signed)
Pt was found to have blood and clots in her stool this evening.  No mention of bloody stool for earlier today.  Pt did not have any complaints of abdominal pain.  Dr. Merrilyn Puma was informed and ordered a STAT PT-INR and CBC lab draws.  Dr. Merrilyn Puma said to call for any changes and to monitor the pt. Lupita Dawn, RN

## 2015-09-10 NOTE — Progress Notes (Signed)
  Date: 09/10/2015  Patient name: [name] [name]  Medical record number: [id number]  Date of birth: 02-07-1956   This patient has been seen and the plan of care was discussed with the house staff. Please see their note for complete details. I concur with their findings with the following additions/corrections: Ms hamsher was more awake and alert this AM and was oreitned. This afternoon, during the fam conference, she again was sedated and not able to participate. During the morning, she was able to clearly tell me that it was OK for [place] to discuss her medical conditions with her family. Dr Arcelia Jew clearly outlined the discussion and plans going forward over the weekend.  Bartholomew Crews, MD 09/10/2015, 2:48 PM

## 2015-09-10 NOTE — Progress Notes (Addendum)
Subjective:  Barely awake, doesn't interact verbally  Objective Vital signs in last 24 hours: Filed Vitals:   09/10/15 0330 09/10/15 0400 09/10/15 0700 09/10/15 0811  BP: 104/63  114/86   Pulse: 76 71 75   Temp:  97.2 F (36.2 C)  97.3 F (36.3 C)  TempSrc:  Axillary  Axillary  Resp: 11 9 11    Height:      Weight:      SpO2: 100% 100% 100%    Weight change: -3.6 kg (-7 lb 15 oz)  Physical Exam: General: lethargic, not responding verbally, nods her head Heart: RRR, no mur,rub Lungs: R cTA / L base Rales Abdomen: soft, nt, Nd, Incres BS Extremities: Dialysis Access: LUA AVF pos bruit no edema Neuro= none focal / Gen weakness   Dialysis: [facility]    3.5h   34.5kg   4/2.25 bath   Hep 2000  L arm AVG Mircera 200 q 2 wks next 3/1 Calcitriol 1.25  On hold sec to ^ ca Recent labs: last hosp hgb 9.4 Fe 86 no tsat ferritin 1684 iPTH 298 Ca 7.8 Phos 2.9 Alb <1.0  Assessment: 1. ESRD - HD MWF. Had HD yest off sched and the day before 2. Hypoglycemia- per admit 3. PNA LLL - on abx 4. Encephalopathy-  Multifactorial 5. FTT - per primary 6. HTN/volume - no meds at dry wt 7. Anemia of ESRD - hgb 9.5 aranesp q wed hd   8. Secondary hyperparathyroidism - low phos 1.4/ No binder/ ^ ca hld po vit d / on regular diet  9. Protein / Calorie Malnutrition -  Alb<1.0 - admit team rx  reg diet/ protein supplements/ renal vit.  10. SLE- On meds per admit =hydroxychloroquine 11. Depression  12. Recent CDif infection - on acidophilus probiotic   Plan - HD tomorrow, then resume MWF  Kelly Splinter MD [facility] pager [phone]    cell [phone] 09/10/2015, 9:42 AM       Labs: Basic Metabolic Panel:  Recent Labs Lab 09/07/15 1816 09/08/15 0220 09/08/15 0649 09/09/15 0254  NA 133*  --  138 134*  K 4.0  --  4.6 4.3  CL 101  --  104 101  CO2 23  --  25 24  GLUCOSE 264*  --  135* 81  BUN <5*  --  <5* <5*  CREATININE 2.52*  --  3.07* 2.91*  CALCIUM 7.3*  --  7.8*  7.7*  PHOS  --  1.8* 1.4* 1.8*   Liver Function Tests:  Recent Labs Lab 09/07/15 1816 09/08/15 0649  AST 90*  --   ALT 26  --   ALKPHOS 87  --   BILITOT 3.8*  --   PROT 6.6  --   ALBUMIN <1.0* <1.0*    Recent Labs Lab 09/07/15 1816  LIPASE 21    Recent Labs Lab 09/07/15 1825  AMMONIA 134*   CBC:  Recent Labs Lab 09/07/15 1816 09/08/15 0649 09/09/15 0254 09/09/15 2320  WBC 5.8 7.8 6.3 6.8  NEUTROABS 3.6  --   --   --   HGB 8.9* 9.5* 8.9* 7.9*  HCT 27.1* 28.6* 28.2* 24.0*  MCV 101.5* 100.4* 99.6 99.2  PLT 37* 36* 18* 14*   Cardiac Enzymes:  Recent Labs Lab 09/07/15 1816  TROPONINI <0.03   CBG:  Recent Labs Lab 09/09/15 1631 09/09/15 2054 09/10/15 0007 09/10/15 0206 09/10/15 0433  GLUCAP 75 77 80 71 89    Studies/Results: No results found. Medications: .  dextrose 25 mL/hr at 09/10/15 0222   . acidophilus  1 capsule Oral Daily  . ceFEPime (MAXIPIME) IV  2 g Intravenous Q M,W,F-HD  . [START ON 09/15/2015] darbepoetin (ARANESP) injection - DIALYSIS  200 mcg Intravenous Q Wed-HD  . feeding supplement (ENSURE ENLIVE)  237 mL Oral TID BM  . FLUoxetine  10 mg Oral Daily  . folic acid  1 mg Oral Daily  . hydroxychloroquine  400 mg Oral Daily  . levETIRAcetam  500 mg Oral Q M,W,F-2000  . lipase/protease/amylase  12,000 Units Oral TID AC  . magnesium oxide  400 mg Oral Daily  . magnesium sulfate 1 - 4 g bolus IVPB  1 g Intravenous Once  . multivitamin with minerals  1 tablet Oral Daily  . pantoprazole (PROTONIX) IV  40 mg Intravenous Q24H  . phytonadione  5 mg Oral Once  . ramelteon  8 mg Oral QHS  . sodium chloride flush  3 mL Intravenous Q12H  . sodium chloride flush  3 mL Intravenous Q12H  . thiamine  100 mg Oral Daily  . vitamin B-12  250 mcg Oral QPM

## 2015-09-10 NOTE — Progress Notes (Signed)
Pharmacy Antibiotic Note  [name] [name] is a 60 y.o. female admitted on 09/07/2015 with possible PNA.  Pharmacy has been consulted for Cefepime  Dosing.  ESRD with HD MWF  Afeb, wbc stable at 6.  Patient received HD 4/6 not quite full treatment with blood flow rates in 200s will supplement with 1g of cefepime today. Next HD planned for Saturday will give full 2g post HD then back on regular schedule 4/10.  Plan: Cefepime 2 g IV now ,and after each HD Follow for changes in HD schedule  Height: 5\' 1"  (154.9 cm) Weight: 74 lb 6.4 oz (33.748 kg) IBW/kg (Calculated) : 47.8  Temp (24hrs), Avg:97.2 F (36.2 C), Min:96 F (35.6 C), Max:98 F (36.7 C)   Recent Labs Lab 09/07/15 1816 09/08/15 0649 09/09/15 0254 09/09/15 2320  WBC 5.8 7.8 6.3 6.8  CREATININE 2.52* 3.07* 2.91*  --   LATICACIDVEN 2.22*  --   --   --     Estimated Creatinine Clearance: 11.2 mL/min (by C-G formula based on Cr of 2.91).    Allergies  Allergen Reactions  . Amitriptyline Hcl Swelling    In the face.  . Doxycycline Hyclate Itching    Feels like something crawling under her skin    Antimicrobials this admission: Cefepime 4/5 >>  4/5 bld x2 >>ngtd  Erin Hearing PharmD., BCPS Clinical Pharmacist Pager [phone] 09/10/2015 10:20 AM

## 2015-09-10 NOTE — Progress Notes (Signed)
Pt's labs were hemoglobin 7.9, PT 29.5, and INR 2.86.  Pt was still continuing to have bloody stool.  Dr. Merrilyn Puma ordered to give a one time dose of Vitamin K 5 mg and to transfer the pt to 2 Heart Unit.  Will continue to monitor pt. Lupita Dawn, RN

## 2015-09-10 NOTE — Progress Notes (Addendum)
[facility] Night Team Progress Note S: Notified earlier in the night that [name] [name] had one BM with clots and scant maroon blood in it, otherwise without abdominal pain or other symptoms and resting comfortably with stable vital signs. We re-checked a CBC and PT/INR, showing slight increase in INR and Hgb decrease to 7.9 (8.9 this morning) and persistently low plalets (14 from 18 earlier this morning). We re-assessed the patient at bedside. She is still lethargic, somnolent but appeared comfortable and in no pain currently. She is answering basic questions coherently, and oriented to person but not time or place. An additional small liquid BM had scant maroon-colored clots in it at bedside.  O:  Filed Vitals:   09/09/15 1213 09/09/15 2102  BP: 102/62 87/55  Pulse: 83 80  Temp: 97.6 F (36.4 C) 98 F (36.7 C)  Resp: 13 14  Gen: Cachectic-appearing, somnolent but arousable and oriented to person,but not place and time CV: Normal rate, regular rhythm, no murmurs, rubs, or gallops Pulmonary: Normal effort, CTA bilaterally, no crackles or wheezes Abdominal: Soft, non-tender, non-distended, without rebound, guarding, or masses. Bowel sounds auscultated. Extremities: Distal pulses 2+ in upper and lower extremities bilaterally, no tenderness, erythema or edema  A/P: Patient in progressive overall decline, with refractory thrombocytopenia, now with intermittent low-volume GI bleeding, most likely due to her severe hepatic and multi-organ dysfunction. Her complexity and current presentation likely require a higher level of care at this time. -Transfer to stepdown -No severe active bleeding, so will withhold platelet transfusion at this time given severe hepatic disease despite persistent thrombocytopenia -If ITP is considered for her thrombocytopenia, a trial of steroids might be of benefit -Vitamin K 5mg  PO now for persistently-elevated INR -Protonix 40mg  IV x 1 now; it appears she missed her scheduled  dose earlier today due to IV access issues -Re-check CBC, PT/INR later this morning -Continue D10 infusion; CBGs have been improved this evening  Update - pt too drowsy for PO vit K. Will give vit K IV 2.5mg  over 57mins .

## 2015-09-10 NOTE — Progress Notes (Signed)
Subjective: She had a large bloody bowel movement overnight. Hgb stable at 7.9 and platelets stable at 14k. She is lethargic this morning, but more interactive than she was yesterday. I discussed with her that we would have goals of care meeting with her and her family this afternoon. She nodded that she understood and stated "I want to live."   Objective: Vital signs in last 24 hours: Filed Vitals:   09/10/15 0330 09/10/15 0400 09/10/15 0700 09/10/15 0811  BP: 104/63  114/86   Pulse: 76 71 75   Temp:  97.2 F (36.2 C)  97.3 F (36.3 C)  TempSrc:  Axillary  Axillary  Resp: 11 9 11    Height:      Weight:      SpO2: 100% 100% 100%    Weight change: -7 lb 15 oz (-3.6 kg)  Intake/Output Summary (Last 24 hours) at 09/10/15 0937 Last data filed at 09/10/15 0700  Gross per 24 hour  Intake 707.33 ml  Output      0 ml  Net 707.33 ml   Physical Exam: General: Cachectic appearing woman, drowsy but able to arouse HEENT: EOMI, mucus membranes moist  CV: RRR, 2/4 diastolic murmur  Lungs: CTA bilaterally, breaths non-labored  Abdomen: BS+, soft, non-tender  Ext: LUE AVF, good thrill and bruit. No peripheral edema.  Neurologic: Lethargic, but more alert than yesterday. Answering questions.   Lab Results: Basic Metabolic Panel:  Recent Labs Lab 09/08/15 0220 09/08/15 0649 09/09/15 0254  NA  --  138 134*  K  --  4.6 4.3  CL  --  104 101  CO2  --  25 24  GLUCOSE  --  135* 81  BUN  --  <5* <5*  CREATININE  --  3.07* 2.91*  CALCIUM  --  7.8* 7.7*  MG 1.6*  --  1.5*  PHOS 1.8* 1.4* 1.8*   Liver Function Tests:  Recent Labs Lab 09/07/15 1816 09/08/15 0649  AST 90*  --   ALT 26  --   ALKPHOS 87  --   BILITOT 3.8*  --   PROT 6.6  --   ALBUMIN <1.0* <1.0*    Recent Labs Lab 09/07/15 1816  LIPASE 21    Recent Labs Lab 09/07/15 1825  AMMONIA 134*   CBC:  Recent Labs Lab 09/07/15 1816  09/09/15 0254 09/09/15 2320  WBC 5.8  < > 6.3 6.8  NEUTROABS 3.6  --    --   --   HGB 8.9*  < > 8.9* 7.9*  HCT 27.1*  < > 28.2* 24.0*  MCV 101.5*  < > 99.6 99.2  PLT 37*  < > 18* 14*  < > = values in this interval not displayed. Cardiac Enzymes:  Recent Labs Lab 09/07/15 1816  TROPONINI <0.03   CBG:  Recent Labs Lab 09/09/15 1430 09/09/15 1631 09/09/15 2054 09/10/15 0007 09/10/15 0206 09/10/15 0433  GLUCAP 81 75 77 80 71 89   Coagulation:  Recent Labs Lab 09/07/15 1816 09/09/15 2320  LABPROT 27.4* 29.5*  INR 2.58* 2.86*   Urine Drug Screen: Drugs of Abuse     Component Value Date/Time   LABOPIA NONE DETECTED 07/16/2015 1400   LABOPIA NEG 09/18/2011 0936   COCAINSCRNUR NONE DETECTED 07/16/2015 1400   COCAINSCRNUR NEG 09/18/2011 0936   LABBENZ NONE DETECTED 07/16/2015 1400   LABBENZ NEG 09/18/2011 0936   LABBENZ NEG 04/10/2011 1130   AMPHETMU NONE DETECTED 07/16/2015 1400   AMPHETMU NEG 09/18/2011 0936  AMPHETMU NEG 04/10/2011 1130   THCU NONE DETECTED 07/16/2015 1400   THCU NEG 09/18/2011 0936   LABBARB NONE DETECTED 07/16/2015 1400   LABBARB NEG 09/18/2011 0936    Alcohol Level:  Recent Labs Lab 09/08/15 0219  ETH <5   Medications: I have reviewed the patient's current medications. Scheduled Meds: . acidophilus  1 capsule Oral Daily  . ceFEPime (MAXIPIME) IV  2 g Intravenous Q M,W,F-HD  . [START ON 09/15/2015] darbepoetin (ARANESP) injection - DIALYSIS  200 mcg Intravenous Q Wed-HD  . feeding supplement (ENSURE ENLIVE)  237 mL Oral TID BM  . FLUoxetine  10 mg Oral Daily  . folic acid  1 mg Oral Daily  . hydroxychloroquine  400 mg Oral Daily  . levETIRAcetam  500 mg Oral Q M,W,F-2000  . lipase/protease/amylase  12,000 Units Oral TID AC  . magnesium oxide  400 mg Oral Daily  . magnesium sulfate 1 - 4 g bolus IVPB  1 g Intravenous Once  . multivitamin with minerals  1 tablet Oral Daily  . pantoprazole (PROTONIX) IV  40 mg Intravenous Q24H  . phytonadione  5 mg Oral Once  . ramelteon  8 mg Oral QHS  . sodium  chloride flush  3 mL Intravenous Q12H  . sodium chloride flush  3 mL Intravenous Q12H  . thiamine  100 mg Oral Daily  . vitamin B-12  250 mcg Oral QPM   Continuous Infusions: . dextrose 25 mL/hr at 09/10/15 0222   PRN Meds:.sodium chloride, albuterol, camphor-menthol, loratadine, sodium chloride flush   Assessment/Plan: [name] [name] is a 60yo woman w/ PMHx of SLE, ESRD, recurrent C. Dif, and multiple other co- morbidities, admitted for AMS, severe hypoglycemia, and pneumonia.   Hypoglycemia: Most likely related to infection and end stage liver disease. No previous h/o DM, cortisol 13.5. Her blood sugars are stable this morning while on the D10 gtt. Would expect her blood sugars to be higher while receiving D10.  - Continue D10 @ 10 cc/hr if able. Use oral glucose gel if needed. - Regular diet - Continue CBG's every 4 hours - ABx as below - Follow cultures  GI Bleeding: She had a large bloody bowel movement overnight. Hgb and platelets stable. She is not currently bleeding now. I think this episode further defines her continued decline in health and that a discussion of end of life care is appropriate. Will monitor for continued bleeding and based on [place] meeting this afternoon will decide if more aggressive interventions are needed. - Continue to monitor for bleeding  HCAP: LLL pneumonia most prominent in the lateral view. Recent hospitalization, immunocompromised, needs coverage for MDR organisms. Given clinical appearance on exam, do not think patient requires Vancomycin for MRSA pneumonia coverage.   - Continue Cefepime for now - Follow blood cultures - Supplemental O2 prn  Chronic Thrombocytopenia: Platelets 37,000 on admission, today dropped to 18,000>14,000. Her thrombocytopenia is likely multifactorial in etiology, including cirrhosis, SLE (was on azathioprine, but stopped last admission), infection. Last hospitalization her platelets were thought to drop due to Vancomycin. Would  avoid Vancomycin use.  - Continue to monitor closely for bleeding - CBC in AM  Recent Resistant C. Diff Infection: Discharged less than a week ago for this clinically improved, just followed with Dr. Storm Frisk office 09/07/15.  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 Recently had successful declot of her AVF by IR on 09/01/15  - Renal following, appreciate  recommendations  Protein and calorie malnutrition: Stable.  - Continue feeding supplements - Regular diet  SLE: Azathioprine was recently d/c'ed due to worsening severe thrombocytopenia and normal dsDNA on last admission. Without evidence of a flare currently. - Continue hydroxychloroquine if patient able to tolerate PO meds  Seizure disorder: Stable - Continue Keppra. Gave IV dose yesterday since pt not tolerating PO diet/meds.  Depression: Stable - Continue Prozac if able to tolerate PO.   Chronic Pancreatitis: Stable - Continue Creon if able to tolerate PO  Advanced Care Discussion: Her health has significantly declined over the past year and she has had multiple hospital admissions in the last 6 months. I think with her multiple medical problems and progression to not tolerating PO intake that our [place] meeting today will be very important moving forward on what medical interventions will be appropriate. We will plan to discuss her code status, HD, hospice, and if we should continue meds/IVFs, etc. Family will be present at meeting today at 1 PM.   Diet: Regular  DVT/PE PPx: SCD's Dispo: Disposition is deferred at this time, awaiting improvement of current medical problems.  Anticipated discharge in approximately 1-2 day(s).   The patient does have a current PCP Shela Leff, MD) and does need an [facility] hospital follow-up appointment after discharge.  The patient does have transportation limitations that hinder transportation to clinic appointments.  .Services  Needed at time of discharge: Y = Yes, Blank = No PT:   OT:   RN:   Equipment:   Other:     LOS: 1 day   [name] Rude, MD 09/10/2015, 9:37 AM

## 2015-09-10 NOTE — Progress Notes (Signed)
Subjective: Overnight had melena and nosebleed.  Platelets decreased from 18K--->14K.  Was transferred to step down for more continual monitoring.   This morning Ms. Harpole is awake and alert this morning and interacting with doctors and family members in the room.  Says she is feeling better. Denies pain and any more blood in her stool.  Ate a few bites of breakfast and drank some fluids.      Objective: Vital signs in last 24 hours: Filed Vitals:   09/10/15 0330 09/10/15 0400 09/10/15 0700 09/10/15 0811  BP: 104/63  114/86   Pulse: 76 71 75   Temp:  97.2 F (36.2 C)  97.3 F (36.3 C)  TempSrc:  Axillary  Axillary  Resp: 11 9 11    Height:      Weight:      SpO2: 100% 100% 100%    Weight change: -3.6 kg (-7 lb 15 oz)  Intake/Output Summary (Last 24 hours) at 09/10/15 1018 Last data filed at 09/10/15 0700  Gross per 24 hour  Intake 707.33 ml  Output      0 ml  Net 707.33 ml   General appearance: alert, cooperative, cachectic, fatigued and slowed mentation Head: Normocephalic, without obvious abnormality, atraumatic Lungs: clear to auscultation bilaterally Heart: regular rate and rhythm Extremities: no LE edema Pulses: could not palpate radial pulses  Basic Metabolic Panel:  Recent Labs Lab 09/08/15 0220 09/08/15 0649 09/09/15 0254  NA  --  138 134*  K  --  4.6 4.3  CL  --  104 101  CO2  --  25 24  GLUCOSE  --  135* 81  BUN  --  <5* <5*  CREATININE  --  3.07* 2.91*  CALCIUM  --  7.8* 7.7*  MG 1.6*  --  1.5*  PHOS 1.8* 1.4* 1.8*   CBC:  Recent Labs Lab 09/07/15 1816  09/09/15 0254 09/09/15 2320  WBC 5.8  < > 6.3 6.8  NEUTROABS 3.6  --   --   --   HGB 8.9*  < > 8.9* 7.9*  HCT 27.1*  < > 28.2* 24.0*  MCV 101.5*  < > 99.6 99.2  PLT 37*  < > 18* 14*  < > = values in this interval not displayed.  Micro Results: Recent Results (from the past 240 hour(s))  Culture, blood (routine x 2)     Status: None (Preliminary result)   Collection Time: 09/08/15   2:40 AM  Result Value Ref Range Status   Specimen Description BLOOD LEFT ARM  Final   Special Requests IN PEDIATRIC BOTTLE 2ML  Final   Culture NO GROWTH 1 DAY  Final   Report Status PENDING  Incomplete  Culture, blood (routine x 2)     Status: None (Preliminary result)   Collection Time: 09/08/15  2:50 AM  Result Value Ref Range Status   Specimen Description BLOOD LEFT HAND  Final   Special Requests IN PEDIATRIC BOTTLE 2ML  Final   Culture NO GROWTH 1 DAY  Final   Report Status PENDING  Incomplete   Studies/Results: No results found. Medications: Scheduled Meds: . acidophilus  1 capsule Oral Daily  . ceFEPime (MAXIPIME) IV  1 g Intravenous Once  . [START ON 09/13/2015] ceFEPime (MAXIPIME) IV  2 g Intravenous Q M,W,F-HD  . [START ON 09/11/2015] ceFEPime (MAXIPIME) IV  2 g Intravenous Q Sat-HD  . [START ON 09/15/2015] darbepoetin (ARANESP) injection - DIALYSIS  200 mcg Intravenous Q Wed-HD  . feeding supplement (  ENSURE ENLIVE)  237 mL Oral TID BM  . FLUoxetine  10 mg Oral Daily  . folic acid  1 mg Oral Daily  . hydroxychloroquine  400 mg Oral Daily  . levETIRAcetam  500 mg Oral Q M,W,F-2000  . lipase/protease/amylase  12,000 Units Oral TID AC  . magnesium oxide  400 mg Oral Daily  . magnesium sulfate 1 - 4 g bolus IVPB  1 g Intravenous Once  . multivitamin with minerals  1 tablet Oral Daily  . pantoprazole (PROTONIX) IV  40 mg Intravenous Q24H  . phytonadione  5 mg Oral Once  . ramelteon  8 mg Oral QHS  . sodium chloride flush  3 mL Intravenous Q12H  . sodium chloride flush  3 mL Intravenous Q12H  . thiamine  100 mg Oral Daily  . vitamin B-12  250 mcg Oral QPM   Continuous Infusions: . dextrose 25 mL/hr at 09/10/15 0222   PRN Meds:.sodium chloride, albuterol, camphor-menthol, loratadine, sodium chloride flush Assessment/Plan: Active Problems:   Hypoglycemia  Ms. Grennan is a 60 y.o. Woman with a history of SLE, ESRD from FSGS, alcoholic liver disease, CHF, and chronic  pancreatitis who was admitted with generalized weakness, AMS, and a blood glucose of 28 after hemodialysis on 4/5. Now with worsening thrombocytopenia, GI bleeding and epistaxis.   Community acquired pneumonia: Patient presented with hypoglycemia and fatigue and weakness. Had a blood glucose of 28 after HD on 09/07/2015. CXR was significant for RLL pulmonary infiltrate concerning for pneumonia, which could be the underlying cause of her hypoglycemia. Unlikely to be MRSA at this time, but will obtain MRSA swab.  --Continue cefepime, 2g IV @100  mL/hr on MWF, last done on first dose on 4/4, 2nd dose on 4/5,  --Pharmacy consult for cefepime management in ESRD patient --Trend CBC, BMP  Hypoglycemia: Likely due to poor oral intake and liver failure. Unlikely to be 2/2 EtOH as her BAC was undetectable. Blood glucose has stabilized in the range of 70-90 with D10, however it is much lower than would be expected with D10, suggesting her liver is still unable to properly regulate blood glucose.   --D10 25 mL/hr; diet and feeding supplements as tolerated with juice/D50/buccal glucose PRN for low CBG. --Restarted Creon, Prozac, since glucose has improved somewhat. Continuing to hold gabapentin.   AMS: Had resolved as of today. Periods of lethargy likely due to hypoglycemia.   --Treat hypoglycemia as outlined above  Thrombocytopenia: Patient has a history of thrombocytopenia. During her last hospitalization her levels decreased to 16-18K. Today her platelets were 14K, down from 18K yesterday. Could be related to medications, or ITP given her lupus. A less likely cause is splenic sequestration given her advanced liver disease.  --Monitor for signs of CNS dysfunction or intracerebral hemorrhage.   GI Bleeding/Epistaxis: Bloody bowel movement overnight.  Hgb is stable at 7.9, platelets at 14K. Mild epistaxis at this time, but no further GI bleeding.  No need for RBC transfusion at this time. Likely  mucosal bleeding due to thrombocytopenia. Will readdress after goals of care family meeting.   --continue to monitor for further signs of bleeding.    Advanced Care Planning: Ms. Feider has multiple chronic co-morbid conditions that seem to be working against each other at this time. In the past 15 months she has had 10 hospitalizations, and in the past 6 months she has had extended hospital admissions each month. During her last hospital stay nephrology recommended palliative care consultation. At this point in time we  agree that a family discussion of end of life goals and care be initiated. Had initial discussion with some family members yesterday regarding her hospital course and her overall disease progression.  Will have formal goals of care meeting today at 1 pm.  Ms. Torney is currently alert, but it remains to be seen how much she will be able to participate in this discussion and how much insight she displays regarding the severity of her illness at this point in time.   --Family meeting, 1 pm.    Recent resistant C.dif infection - discharged less than a week ago for this clinically improved, just followed with Dr. 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 -Nephology following; scheduled for dialysis today.    Protein and calorie malnutrition --Continue feeding supplements --Hold mirtazapine   SLE - azathioprine was recently d/c'ed due to worsening severe thrombocytopenia and normal dsDNA on last admission. Without evidence of a flare currently. -Continue hydroxychloroquine  Seizure disorder - denies recent seizure activity -IV Keppra (250 mg)  given due to no PO intake yesterday.  Will get 750 mg today; currently tolerating PO so will restart PO.  Dispo: Disposition is deferred at this time, awaiting improvement of current medical problems.  Anticipated discharge in approximately 1-3 day(s).   This is a Careers information officer Note.  The care of the patient was discussed with Dr. Lynnae January and the assessment and plan formulated with their assistance.  Please see their attached note for official documentation of the daily encounter.   LOS: 1 day   [place], Med Student 09/10/2015, 10:18 AM

## 2015-09-10 NOTE — Care Management Note (Signed)
Case Management Note Marvetta Gibbons RN, BSN Unit 2W-Case Manager [phone]  Patient Details  Name: KAJA KEENER MRN: [id number] Date of Birth: 13-Nov-1955  Subjective/Objective:    Pt admitted with hypogycemia                Action/Plan: PTA pt was at [facility]- spoke with pt and daughter at bedside- plan is for pt to return to SNF when medically stable- CSW aware and following for d/c needs to return to SNF  Expected Discharge Date:                Expected Discharge Plan:  Skilled Nursing Facility  In-House Referral:  Clinical Social Work  Discharge planning Services     Post Acute Care Choice:    Choice offered to:     DME Arranged:    DME Agency:     HH Arranged:    [place] Agency:     Status of Service:  In process, will continue to follow  Medicare Important Message Given:    Date Medicare IM Given:    Medicare IM give by:    Date Additional Medicare IM Given:    Additional Medicare Important Message give by:     If discussed at [place] of Stay Meetings, dates discussed:    Additional Comments:  Dawayne Patricia, RN 09/08/2015 [phone] PM

## 2015-09-10 NOTE — Progress Notes (Signed)
Goals of Care Discussion  I met with [name] family, which included [name]), husband, and numerous other family members as well as my attending, Dr. Lynnae January, and medical student [place]. [name] was not able to fully participate in the conversation due to her lethargy. I gained permission from [name] to speak about her medical conditions with her family. We discussed that [name] has had a significant decline in her health over the last 6 months with multiple hospitalizations. Her daughter states she thinks this is largely in part to her starting to drink alcohol again about 3 months ago. We discussed that the main issue right now is her hypoglycemia and not being able to wean her from the D10 drip. The family has agreed that if she is able to be weaned off the drip that they would like for her to go to back to the nursing facility. We agreed that if she is not able to get off the D10 drip that we would have another discussion in regards to hospice. The family is not interested in any home health services or home hospice. We discussed that ideally [name] would like to go home if possible but the family does not have the means to provide more intensive care if needed and do not want any additional help from home health services. We discussed code status and the daughter was adamant that her mother stated she "wanted everything done, including life support." However, her daughter also stated she does not want her mother to be in pain. She would like for [name] to remain a full code for now, but in the event she has a cardiac arrest they would like to be contacted and possibly stop further measures once they reach the hospital. The daughter reports she has filled out paperwork to be [name], but these papers have not been signed. We made it clear that [name] has to be fully alert and oriented for her to sign these papers. At this point we will continue  aggressive treatment for the next few days and if unable to get her off the D10 drip for her hypoglycemia and/or if her other medical conditions worsen, we will have another discussion with the family in regards to transitioning to hospice.   Code Status: FULL Daughter: [name] [name] [phone] (home)   [phone] (cell)  Albin Felling, MD, MPH Internal Medicine Resident, PGY-II Pager: [phone]

## 2015-09-10 NOTE — Care Management Note (Signed)
Case Management Note  Patient Details  Name: [name] [name] MRN: [id number] Date of Birth: [birth year]  Subjective/Objective:   Adm w hypoglycemia                 Action/Plan: from nsg facility per chart, sw ref placed   Expected Discharge Date:                 Expected Discharge Plan:  [place]  In-House Referral:  Clinical Social Work  Discharge planning Services     Post Acute Care Choice:    Choice offered to:     DME Arranged:    DME Agency:     HH Arranged:    [place] Agency:     Status of Service:     Medicare Important Message Given:    Date Medicare IM Given:    Medicare IM give by:    Date Additional Medicare IM Given:    Additional Medicare Important Message give by:     If discussed at [place] of Stay Meetings, dates discussed:    Additional Comments: pcp dr Ina Homes, RN 09/10/2015, 10:40 AM

## 2015-09-10 NOTE — Plan of Care (Signed)
Problem: Education: Goal: Knowledge of [place] [facility] Education information/materials will improve Outcome: Progressing With family

## 2015-09-10 NOTE — Progress Notes (Signed)
Subjective:  More awake, interacting minimally, trying to eat w friend feeding her  Objective Vital signs in last 24 hours: Filed Vitals:   09/10/15 0400 09/10/15 0700 09/10/15 0811 09/10/15 1128  BP:  114/86  86/60  Pulse: 71 75  73  Temp: 97.2 F (36.2 C)  97.3 F (36.3 C) 97.9 F (36.6 C)  TempSrc: Axillary  Axillary Oral  Resp: 9 11  16   Height:      Weight:      SpO2: 100% 100%  100%   Weight change: -3.6 kg (-7 lb 15 oz)  Physical Exam: General: lethargic, very weak Heart: RRR, no mur,rub Lungs: R cTA / L base Rales Abdomen: soft, nt, Nd, Incres BS Extremities: Dialysis Access: LUA AVF pos bruit no edema Neuro= none focal / Gen weakness   [facility]    3.5h   34.5kg   4/2.25 bath   Hep 2000  L arm AVG Mircera 200 q 2 wks next 3/1 Calcitriol 1.25  On hold sec to ^ ca Recent labs: last hosp hgb 9.4 Fe 86 no tsat ferritin 1684 iPTH 298 Ca 7.8 Phos 2.9 Alb <1.0  Assessment: 1. ESRD - HD MWF. Had HD yest off sched and the day before 2. Hypoglycemia- per admit 3. PNA LLL - on abx 4. Encephalopathy-  Multifactorial 5. FTT - per primary 6. HTN/volume - no meds at dry wt 7. Anemia of ESRD - hgb 9.5 aranesp q wed hd   8. Secondary hyperparathyroidism - low phos 1.4/ No binder/ ^ ca hld po vit d / on regular diet  9. Protein / Calorie Malnutrition -  Alb<1.0 - admit team rx  reg diet/ protein supplements/ renal vit.  10. SLE- On meds per admit =hydroxychloroquine 11. Depression  12. Recent CDif infection - on acidophilus probiotic   Plan - No HD today. Plan HD tomorrow, then resume MWF  Kelly Splinter MD [facility] pager [phone]    cell [phone] 09/10/2015, 11:39 AM       Labs: Basic Metabolic Panel:  Recent Labs Lab 09/07/15 1816 09/08/15 0220 09/08/15 0649 09/09/15 0254  NA 133*  --  138 134*  K 4.0  --  4.6 4.3  CL 101  --  104 101  CO2 23  --  25 24  GLUCOSE 264*  --  135* 81  BUN <5*  --  <5* <5*  CREATININE 2.52*   --  3.07* 2.91*  CALCIUM 7.3*  --  7.8* 7.7*  PHOS  --  1.8* 1.4* 1.8*   Liver Function Tests:  Recent Labs Lab 09/07/15 1816 09/08/15 0649  AST 90*  --   ALT 26  --   ALKPHOS 87  --   BILITOT 3.8*  --   PROT 6.6  --   ALBUMIN <1.0* <1.0*    Recent Labs Lab 09/07/15 1816  LIPASE 21    Recent Labs Lab 09/07/15 1825  AMMONIA 134*   CBC:  Recent Labs Lab 09/07/15 1816 09/08/15 0649 09/09/15 0254 09/09/15 2320  WBC 5.8 7.8 6.3 6.8  NEUTROABS 3.6  --   --   --   HGB 8.9* 9.5* 8.9* 7.9*  HCT 27.1* 28.6* 28.2* 24.0*  MCV 101.5* 100.4* 99.6 99.2  PLT 37* 36* 18* 14*   Cardiac Enzymes:  Recent Labs Lab 09/07/15 1816  TROPONINI <0.03   CBG:  Recent Labs Lab 09/09/15 1631 09/09/15 2054 09/10/15 0007 09/10/15 0206 09/10/15 0433  GLUCAP 75 77 80 71  89    Studies/Results: No results found. Medications: . dextrose 25 mL/hr at 09/10/15 1100   . acidophilus  1 capsule Oral Daily  . ceFEPime (MAXIPIME) IV  1 g Intravenous Once  . [START ON 09/13/2015] ceFEPime (MAXIPIME) IV  2 g Intravenous Q M,W,F-HD  . [START ON 09/11/2015] ceFEPime (MAXIPIME) IV  2 g Intravenous Q Sat-HD  . [START ON 09/15/2015] darbepoetin (ARANESP) injection - DIALYSIS  200 mcg Intravenous Q Wed-HD  . feeding supplement (ENSURE ENLIVE)  237 mL Oral TID BM  . FLUoxetine  10 mg Oral Daily  . folic acid  1 mg Oral Daily  . hydroxychloroquine  400 mg Oral Daily  . levETIRAcetam  500 mg Oral Q M,W,F-2000  . lipase/protease/amylase  12,000 Units Oral TID AC  . magnesium oxide  400 mg Oral Daily  . magnesium sulfate 1 - 4 g bolus IVPB  1 g Intravenous Once  . multivitamin with minerals  1 tablet Oral Daily  . pantoprazole (PROTONIX) IV  40 mg Intravenous Q24H  . phytonadione  5 mg Oral Once  . ramelteon  8 mg Oral QHS  . sodium chloride flush  3 mL Intravenous Q12H  . sodium chloride flush  3 mL Intravenous Q12H  . thiamine  100 mg Oral Daily  . vitamin B-12  250 mcg Oral QPM

## 2015-09-10 NOTE — Clinical Documentation Improvement (Signed)
Internal Medicine Teaching Service  Please document query response in the progress notes and discharge summary, not on the CDI BPA form.  Thank you.  Please document the severity of the patient's protein and calorie malnutrition:  - Severe  - Other severity  - Unable to clinically determine  Clinical Information: "Protein and calorie malnutrition" is documented in the H&P and MD progress notes. Registered Dietician Assessment 09/08/15 at 3:58 pm Rogue Bussing, RD "Severe malnutrition in context of chronic illness, Underweight"  "Malnutrition related to chronic illness as evidenced by severe depletion of body fat, severe depletion of muscle mass" Height - 5\' 1"  Weight 84 lbs  Please exercise your independent, professional judgment when responding. A specific answer is not anticipated or expected.   Thank You, Erling Conte  RN BSN CCDS [phone] Health Information Management [place]

## 2015-09-11 LAB — CBC
HCT: 19.7 % — ABNORMAL LOW (ref 36.0–46.0)
Hemoglobin: 6.3 g/dL — CL (ref 12.0–15.0)
MCH: 31.2 pg (ref 26.0–34.0)
MCHC: 32 g/dL (ref 30.0–36.0)
MCV: 97.5 fL (ref 78.0–100.0)
PLATELETS: 25 10*3/uL — AB (ref 150–400)
RBC: 2.02 MIL/uL — ABNORMAL LOW (ref 3.87–5.11)
RDW: 20.8 % — AB (ref 11.5–15.5)
WBC: 6.4 10*3/uL (ref 4.0–10.5)

## 2015-09-11 LAB — RENAL FUNCTION PANEL
Albumin: <1 g/dL — ABNORMAL LOW (ref 3.5–5.0)
Anion gap: 8 (ref 5–15)
BUN: 7 mg/dL (ref 6–20)
CHLORIDE: 98 mmol/L — AB (ref 101–111)
CO2: 24 mmol/L (ref 22–32)
CREATININE: 3.74 mg/dL — AB (ref 0.44–1.00)
Calcium: 7.8 mg/dL — ABNORMAL LOW (ref 8.9–10.3)
GFR calc Af Amer: 14 mL/min — ABNORMAL LOW (ref >60)
GFR, EST NON AFRICAN AMERICAN: 12 mL/min — AB (ref >60)
Glucose, Bld: 109 mg/dL — ABNORMAL HIGH (ref 65–99)
Phosphorus: 1.9 mg/dL — ABNORMAL LOW (ref 2.5–4.6)
Potassium: 3.4 mmol/L — ABNORMAL LOW (ref 3.5–5.1)
Sodium: 130 mmol/L — ABNORMAL LOW (ref 135–145)

## 2015-09-11 LAB — GLUCOSE, CAPILLARY
Glucose-Capillary: 109 mg/dL — ABNORMAL HIGH (ref 65–99)
Glucose-Capillary: 120 mg/dL — ABNORMAL HIGH (ref 65–99)
Glucose-Capillary: 56 mg/dL — ABNORMAL LOW (ref 65–99)

## 2015-09-11 LAB — PREPARE RBC (CROSSMATCH)

## 2015-09-11 MED ORDER — SODIUM CHLORIDE 0.9 % IV SOLN: Freq: Once | INTRAVENOUS | Status: DC

## 2015-09-11 MED ORDER — HEPARIN SODIUM (PORCINE) 1000 UNIT/ML DIALYSIS: 2000.0000 [IU] | Freq: Once | INTRAMUSCULAR | Status: DC

## 2015-09-11 MED ORDER — LEVETIRACETAM 250 MG PO TABS
250.0000 mg | ORAL_TABLET | Freq: Every day | ORAL | Status: DC
  Filled 2015-09-11: qty 1

## 2015-09-11 MED ORDER — SODIUM CHLORIDE 0.9 % IV SOLN: 100.0000 mL | INTRAVENOUS | Status: DC | PRN

## 2015-09-11 MED ORDER — DEXTROSE 50 % IV SOLN
INTRAVENOUS | Status: AC
  Administered 2015-09-11: 50 mL via INTRAVENOUS
  Filled 2015-09-11: qty 50

## 2015-09-11 MED ORDER — HEPARIN SODIUM (PORCINE) 1000 UNIT/ML DIALYSIS: 1000.0000 [IU] | INTRAMUSCULAR | Status: DC | PRN

## 2015-09-11 MED ORDER — PENTAFLUOROPROP-TETRAFLUOROETH EX AERO
1.0000 | INHALATION_SPRAY | CUTANEOUS | Status: DC | PRN
Start: 2015-09-11 — End: 2015-09-11

## 2015-09-11 MED ORDER — CETYLPYRIDINIUM CHLORIDE 0.05 % MT LIQD
7.0000 mL | Freq: Two times a day (BID) | OROMUCOSAL | Status: DC
  Administered 2015-09-11 – 2015-09-13 (×3): 7 mL via OROMUCOSAL

## 2015-09-11 MED ORDER — ALTEPLASE 2 MG IJ SOLR: 2.0000 mg | Freq: Once | INTRAMUSCULAR | Status: DC | PRN

## 2015-09-11 MED ORDER — LIDOCAINE 5 % EX OINT
TOPICAL_OINTMENT | Freq: Every day | CUTANEOUS | Status: DC | PRN
  Filled 2015-09-11: qty 35.44

## 2015-09-11 MED ORDER — LIDOCAINE HCL (PF) 1 % IJ SOLN: 5.0000 mL | INTRAMUSCULAR | Status: DC | PRN

## 2015-09-11 MED ORDER — DEXTROSE 10 % IV SOLN
INTRAVENOUS | Status: DC
  Administered 2015-09-12: 14:00:00 via INTRAVENOUS

## 2015-09-11 MED ORDER — LIDOCAINE-PRILOCAINE 2.5-2.5 % EX CREA
1.0000 | TOPICAL_CREAM | CUTANEOUS | Status: DC | PRN
Start: 2015-09-11 — End: 2015-09-11
  Filled 2015-09-11: qty 5

## 2015-09-11 MED ORDER — DEXTROSE 50 % IV SOLN
1.0000 | Freq: Once | INTRAVENOUS | Status: AC
  Administered 2015-09-11: 50 mL via INTRAVENOUS

## 2015-09-11 NOTE — Progress Notes (Signed)
   09/11/15 1342  Clinical Encounter Type  Visited With Patient and family together  Visit Type Spiritual support  Referral From Family  Spiritual Encounters  Spiritual Needs Prayer  Stress Factors  Patient Stress Factors Health changes  Family Stress Factors Health changes  Sister asked to have chaplain paged for prayer. Sister and brothers present with patient. Patient suffers multiple medical problems and appears far older and more frail than her age. Johan Creveling, Chaplain

## 2015-09-11 NOTE — Progress Notes (Signed)
Patient pulled  her only iv out.Two  Rns attempted to restart iv but were unsuccessful. IV team was paged to establish iv access. Per Mellisa of Iv team they are unable to attempt iv on this patient because 2 Iv team nurses have already unsuccessfully attempted to establish iv access in her during this admission.

## 2015-09-11 NOTE — Progress Notes (Signed)
Pt refusing PO medication at this time. MD on call notified and asked if they would like to order keppra intravenously. Will await orders at this time.

## 2015-09-11 NOTE — Progress Notes (Signed)
Subjective: [name] [name] is awake today and says she is feeling okay.  She denies being in any pain.  Says she is at [place] her head when asked about enjoying all of her family visitors yesterday.  Nodded her head when asked if she wanted to be back in her LTAC.    O/N lost IV but access was re-established this morning.    Objective: Vital signs in last 24 hours: Filed Vitals:   09/11/15 0000 09/11/15 0400 09/11/15 0625 09/11/15 0800  BP: 103/71 102/62  119/71  Pulse: 85 77 76 72  Temp: 98.1 F (36.7 C) 97.1 F (36.2 C)  97.5 F (36.4 C)  TempSrc: Axillary Axillary  Axillary  Resp: 26 12 9 10   Height:      Weight:   33.702 kg (74 lb 4.8 oz)   SpO2: 99% 100% 100% 100%   Weight change: -1.098 kg (-2 lb 6.7 oz)  Intake/Output Summary (Last 24 hours) at 09/11/15 1036 Last data filed at 09/11/15 0600  Gross per 24 hour  Intake 638.75 ml  Output      0 ml  Net 638.75 ml   BP 119/71 mmHg  Pulse 72  Temp(Src) 97.5 F (36.4 C) (Axillary)  Resp 10  Ht 5\' 1"  (1.549 m)  Wt 33.702 kg (74 lb 4.8 oz)  BMI 14.05 kg/m2  SpO2 100% General appearance: cachectic, fatigued and slowed mentation Head: Normocephalic, without obvious abnormality, atraumatic Lungs: clear to auscultation bilaterally Heart: regular rate and rhythm, S1, S2 normal and diastolic murmur: early diastolic 3/6, decrescendo at 2nd left intercostal space Extremities: extremities normal, atraumatic, no cyanosis or edema Pulses: 2+ and symmetric Lab Results: No labs today   Scheduled Meds: . acidophilus  1 capsule Oral Daily  . antiseptic oral rinse  7 mL Mouth Rinse BID  . [START ON 09/13/2015] ceFEPime (MAXIPIME) IV  2 g Intravenous Q M,W,F-HD  . ceFEPime (MAXIPIME) IV  2 g Intravenous Q Sat-HD  . [START ON 09/15/2015] darbepoetin (ARANESP) injection - DIALYSIS  200 mcg Intravenous Q Wed-HD  . feeding supplement (ENSURE ENLIVE)  237 mL Oral TID BM  . FLUoxetine  10 mg Oral Daily  . folic acid   1 mg Oral Daily  . hydroxychloroquine  400 mg Oral Daily  . levETIRAcetam  250 mg Oral Daily  . levETIRAcetam  500 mg Oral Q M,W,F-2000  . lipase/protease/amylase  12,000 Units Oral TID AC  . magnesium oxide  400 mg Oral Daily  . magnesium sulfate 1 - 4 g bolus IVPB  1 g Intravenous Once  . multivitamin with minerals  1 tablet Oral Daily  . pantoprazole (PROTONIX) IV  40 mg Intravenous Q24H  . phytonadione  5 mg Oral Once  . ramelteon  8 mg Oral QHS  . sodium chloride flush  3 mL Intravenous Q12H  . sodium chloride flush  3 mL Intravenous Q12H  . thiamine  100 mg Oral Daily  . vitamin B-12  250 mcg Oral QPM   Continuous Infusions: . dextrose 25 mL/hr at 09/11/15 0300   PRN Meds:.sodium chloride, albuterol, camphor-menthol, lidocaine, loratadine, sodium chloride flush Assessment/Plan: Active Problems:   Hypoglycemia  [name] is a 60 y.o. 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 Now with worsening thrombocytopenia, GI bleeding and epistaxis.   Community acquired pneumonia: Patient  presented with hypoglycemia and fatigue and weakness. Had a blood glucose of 28 after HD on 09/07/2015. CXR was significant for RLL pulmonary infiltrate concerning for pneumonia, which could be the underlying cause of her hypoglycemia. Unlikely to be MRSA at this time, but will obtain MRSA swab.  --Continue cefepime, 2g IV @100  mL/hr on MWF, last done on first dose on 4/4, 2nd dose on 4/5, last dose given was 4/7   Hypoglycemia: Likely due to poor oral intake and liver failure. Blood glucose has stabilized in the range of 70-90 with D10, however it is much lower than would be expected with D10, suggesting her liver is still unable to properly regulate blood glucose. At the family discussion yesterday it was agreed upon to try and ween her  from the D10 to see if her liver can keep up with her current PO intake (small amounts of food), and if it does we will plan for discharge on Monday.  If it does not we will revisit hospice/palliative care options.   --D10 25 mL/hr; diet and feeding supplements as tolerated with juice/D50/buccal glucose PRN for low CBG. --Decrease D10 to 20 mL/hr this afternoon --Continue to check fingerstick glucose q2h --Restarted Creon, Prozac, since glucose has improved somewhat. Continuing to hold gabapentin.   AMS: Continued lethargy and unresposiveness. Oriented to place only.  Awareness waxes and wanes.   --Treat hypoglycemia as outlined above  Thrombocytopenia: Patient has a history of thrombocytopenia. During her last hospitalization her levels decreased to 16-18K. Yesterday was 14K.  No CBC today.  Likely secondary to advanced liver diease.   --Monitor for signs of CNS dysfunction or intracerebral hemorrhage.   GI Bleeding/Epistaxis: GI bleeding currently has stabilized.No CBC today. No need for RBC transfusion at this time. Likely mucosal bleeding due to thrombocytopenia.  --continue to monitor for further signs of bleeding.   Advanced Care Planning: Initial meeting with family yesterday went well.  Decided on keeping her full code for now given [name] expressed wishes on admission.   POA has not been formally established, but family members are in agreement that [name] preferred that her daughter [name] be the decision maker should she become incapacitated.  The family is aware of [name] deteriorating status.  They agreed to try and week her off IV dextrose this weekend, knowing that would likely not be possible.  On Monday we will revisit the way forward, including discussion of hospice at a nursing facility or palliative care.    Recent resistant C.dif infection - discharged less than a week ago for this clinically improved, just followed with Dr. 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  Will have HD today and then will resume MWF schedule next week.   -Nephology following; scheduled for dialysis today.   Protein and calorie malnutrition --Continue feeding supplements --Hold mirtazapine   SLE - azathioprine was recently d/c'ed due to worsening severe thrombocytopenia and normal dsDNA on last admission. Without evidence of a flare currently. -Continue hydroxychloroquine  Seizure disorder - denies recent seizure activity -IV Keppra (250 mg) given due to no PO intake yesterday. Will get 750 mg today; currently tolerating PO so will restart PO.  Dispo: Disposition is deferred at this time, awaiting improvement of current medical problems. Anticipated discharge in approximately 1-3 day(s).  This is a Careers information officer Note.  The care of the patient was discussed with Dr.  and  the assessment and plan formulated with their assistance.  Please see their attached note for official documentation of the daily encounter.   LOS: 2 days   Burt Ek, Med Student 09/11/2015, 10:36 AM

## 2015-09-11 NOTE — Progress Notes (Signed)
Subjective:  More awake, interacting minimally, trying to eat w friend feeding her  Objective Vital signs in last 24 hours: Filed Vitals:   09/11/15 0000 09/11/15 0400 09/11/15 0625 09/11/15 0800  BP: 103/71 102/62  119/71  Pulse: 85 77 76 72  Temp: 98.1 F (36.7 C) 97.1 F (36.2 C)  97.5 F (36.4 C)  TempSrc: Axillary Axillary  Axillary  Resp: 26 12 9 10   Height:      Weight:   33.702 kg (74 lb 4.8 oz)   SpO2: 99% 100% 100% 100%   Weight change: -1.098 kg (-2 lb 6.7 oz)  Physical Exam: General: lethargic, very weak Heart: RRR, no mur,rub Lungs: R cTA / L base Rales Abdomen: soft, nt, Nd, Incres BS Extremities: Dialysis Access: LUA AVF pos bruit no edema Neuro= none focal / Gen weakness   [facility]: [facility]    3.5h   34.5kg   4/2.25 bath   Hep 2000  L arm AVG Mircera 200 q 2 wks next 3/1 Calcitriol 1.25  On hold sec to ^ ca Recent labs: last hosp hgb 9.4 Fe 86 no tsat ferritin 1684 iPTH 298 Ca 7.8 Phos 2.9 Alb <1.0  Assessment: 1. ESRD - HD MWF, HD today off sched 2. Hypoglycemia- per admit 3. PNA LLL - on abx 4. Encephalopathy-  Multifactorial/ FTT 5. FTT - per primary 6. HTN/volume - no meds, under dry wt 7. Anemia of ESRD - hgb 9.5 aranesp q wed hd   8. Secondary hyperparathyroidism - low phos 1.4/ No binder/ ^ ca hld po vit d / on regular diet  9. Protein / Calorie Malnutrition -  Alb<1.0 - admit team rx  reg diet/ protein supplements/ renal vit.  10. SLE- On meds per admit =hydroxychloroquine 11. Depression  12. Recent CDif infection - on acidophilus probiotic 13. EOL - primary team having [place] meeting w family +/- patient today   Plan - short HD today, no fluid off.   Kelly Splinter MD [facility] pager [phone]    cell [phone] 09/11/2015, 11:53 AM       Labs: Basic Metabolic Panel:  Recent Labs Lab 09/07/15 1816 09/08/15 0220 09/08/15 0649 09/09/15 0254  NA 133*  --  138 134*  K 4.0  --  4.6 4.3  CL 101  --  104 101  CO2  23  --  25 24  GLUCOSE 264*  --  135* 81  BUN <5*  --  <5* <5*  CREATININE 2.52*  --  3.07* 2.91*  CALCIUM 7.3*  --  7.8* 7.7*  PHOS  --  1.8* 1.4* 1.8*   Liver Function Tests:  Recent Labs Lab 09/07/15 1816 09/08/15 0649  AST 90*  --   ALT 26  --   ALKPHOS 87  --   BILITOT 3.8*  --   PROT 6.6  --   ALBUMIN <1.0* <1.0*    Recent Labs Lab 09/07/15 1816  LIPASE 21    Recent Labs Lab 09/07/15 1825  AMMONIA 134*   CBC:  Recent Labs Lab 09/07/15 1816 09/08/15 0649 09/09/15 0254 09/09/15 2320  WBC 5.8 7.8 6.3 6.8  NEUTROABS 3.6  --   --   --   HGB 8.9* 9.5* 8.9* 7.9*  HCT 27.1* 28.6* 28.2* 24.0*  MCV 101.5* 100.4* 99.6 99.2  PLT 37* 36* 18* 14*   Cardiac Enzymes:  Recent Labs Lab 09/07/15 1816  TROPONINI <0.03   CBG:  Recent Labs Lab 09/10/15 0805 09/10/15 1122  09/10/15 1650 09/10/15 2003 09/10/15 2348  GLUCAP 94 121* 88 113* 109*    Studies/Results: No results found. Medications: . dextrose 25 mL/hr at 09/11/15 0300   . acidophilus  1 capsule Oral Daily  . antiseptic oral rinse  7 mL Mouth Rinse BID  . [START ON 09/13/2015] ceFEPime (MAXIPIME) IV  2 g Intravenous Q M,W,F-HD  . ceFEPime (MAXIPIME) IV  2 g Intravenous Q Sat-HD  . [START ON 09/15/2015] darbepoetin (ARANESP) injection - DIALYSIS  200 mcg Intravenous Q Wed-HD  . feeding supplement (ENSURE ENLIVE)  237 mL Oral TID BM  . FLUoxetine  10 mg Oral Daily  . folic acid  1 mg Oral Daily  . hydroxychloroquine  400 mg Oral Daily  . levETIRAcetam  250 mg Oral Daily  . levETIRAcetam  500 mg Oral Q M,W,F-2000  . lipase/protease/amylase  12,000 Units Oral TID AC  . magnesium oxide  400 mg Oral Daily  . magnesium sulfate 1 - 4 g bolus IVPB  1 g Intravenous Once  . multivitamin with minerals  1 tablet Oral Daily  . pantoprazole (PROTONIX) IV  40 mg Intravenous Q24H  . phytonadione  5 mg Oral Once  . ramelteon  8 mg Oral QHS  . sodium chloride flush  3 mL Intravenous Q12H  . sodium chloride  flush  3 mL Intravenous Q12H  . thiamine  100 mg Oral Daily  . vitamin B-12  250 mcg Oral QPM

## 2015-09-11 NOTE — Progress Notes (Signed)
Subjective: She lost her IV overnight but nursing was able to reestablish access this morning. She is minimally responsive to questions, very lethargic. Ex-husband at bedside states he realizes the reality of the situation and will talk to the patient's daughter about transferring her to hospice care. Ex-husband notes she did not eat much for breakfast.   Objective: Vital signs in last 24 hours: Filed Vitals:   09/11/15 0000 09/11/15 0400 09/11/15 0625 09/11/15 0800  BP: 103/71 102/62  119/71  Pulse: 85 77 76 72  Temp: 98.1 F (36.7 C) 97.1 F (36.2 C)  97.5 F (36.4 C)  TempSrc: Axillary Axillary  Axillary  Resp: 26 12 9 10   Height:      Weight:   74 lb 4.8 oz (33.702 kg)   SpO2: 99% 100% 100% 100%   Weight change: -2 lb 6.7 oz (-1.098 kg)  Intake/Output Summary (Last 24 hours) at 09/11/15 0929 Last data filed at 09/11/15 0600  Gross per 24 hour  Intake 663.75 ml  Output      0 ml  Net 663.75 ml   Physical Exam: General: Cachectic appearing woman, drowsy but able to arouse HEENT: EOMI, mucus membranes moist  CV: RRR, 2/4 diastolic murmur  Lungs: CTA bilaterally, breaths non-labored  Abdomen: BS+, soft, non-tender  Ext: LUE AVF, good thrill and bruit. No peripheral edema.  Neurologic: Lethargic, does not open eyes. Will moan or have one word answers.   Lab Results: Basic Metabolic Panel:  Recent Labs Lab 09/08/15 0220 09/08/15 0649 09/09/15 0254  NA  --  138 134*  K  --  4.6 4.3  CL  --  104 101  CO2  --  25 24  GLUCOSE  --  135* 81  BUN  --  <5* <5*  CREATININE  --  3.07* 2.91*  CALCIUM  --  7.8* 7.7*  MG 1.6*  --  1.5*  PHOS 1.8* 1.4* 1.8*   Liver Function Tests:  Recent Labs Lab 09/07/15 1816 09/08/15 0649  AST 90*  --   ALT 26  --   ALKPHOS 87  --   BILITOT 3.8*  --   PROT 6.6  --   ALBUMIN <1.0* <1.0*    Recent Labs Lab 09/07/15 1816  LIPASE 21    Recent Labs Lab 09/07/15 1825  AMMONIA 134*   CBC:  Recent Labs Lab  09/07/15 1816  09/09/15 0254 09/09/15 2320  WBC 5.8  < > 6.3 6.8  NEUTROABS 3.6  --   --   --   HGB 8.9*  < > 8.9* 7.9*  HCT 27.1*  < > 28.2* 24.0*  MCV 101.5*  < > 99.6 99.2  PLT 37*  < > 18* 14*  < > = values in this interval not displayed. Cardiac Enzymes:  Recent Labs Lab 09/07/15 1816  TROPONINI <0.03   CBG:  Recent Labs Lab 09/10/15 0433 09/10/15 0805 09/10/15 1122 09/10/15 1650 09/10/15 2003 09/10/15 2348  GLUCAP 89 94 121* 88 113* 109*   Coagulation:  Recent Labs Lab 09/07/15 1816 09/09/15 2320  LABPROT 27.4* 29.5*  INR 2.58* 2.86*   Urine Drug Screen: Drugs of Abuse     Component Value Date/Time   LABOPIA NONE DETECTED 07/16/2015 1400   LABOPIA NEG 09/18/2011 0936   COCAINSCRNUR NONE DETECTED 07/16/2015 1400   COCAINSCRNUR NEG 09/18/2011 0936   LABBENZ NONE DETECTED 07/16/2015 1400   LABBENZ NEG 09/18/2011 0936   LABBENZ NEG 04/10/2011 1130   AMPHETMU NONE DETECTED  07/16/2015 1400   AMPHETMU NEG 09/18/2011 0936   AMPHETMU NEG 04/10/2011 1130   THCU NONE DETECTED 07/16/2015 1400   THCU NEG 09/18/2011 0936   LABBARB NONE DETECTED 07/16/2015 1400   LABBARB NEG 09/18/2011 0936    Alcohol Level:  Recent Labs Lab 09/08/15 0219  ETH <5   Medications: I have reviewed the patient's current medications. Scheduled Meds: . acidophilus  1 capsule Oral Daily  . antiseptic oral rinse  7 mL Mouth Rinse BID  . [START ON 09/13/2015] ceFEPime (MAXIPIME) IV  2 g Intravenous Q M,W,F-HD  . ceFEPime (MAXIPIME) IV  2 g Intravenous Q Sat-HD  . [START ON 09/15/2015] darbepoetin (ARANESP) injection - DIALYSIS  200 mcg Intravenous Q Wed-HD  . feeding supplement (ENSURE ENLIVE)  237 mL Oral TID BM  . FLUoxetine  10 mg Oral Daily  . folic acid  1 mg Oral Daily  . hydroxychloroquine  400 mg Oral Daily  . levETIRAcetam  500 mg Oral Q M,W,F-2000  . lipase/protease/amylase  12,000 Units Oral TID AC  . magnesium oxide  400 mg Oral Daily  . magnesium sulfate 1 - 4 g  bolus IVPB  1 g Intravenous Once  . multivitamin with minerals  1 tablet Oral Daily  . pantoprazole (PROTONIX) IV  40 mg Intravenous Q24H  . phytonadione  5 mg Oral Once  . ramelteon  8 mg Oral QHS  . sodium chloride flush  3 mL Intravenous Q12H  . sodium chloride flush  3 mL Intravenous Q12H  . thiamine  100 mg Oral Daily  . vitamin B-12  250 mcg Oral QPM   Continuous Infusions: . dextrose 25 mL/hr at 09/11/15 0300   PRN Meds:.sodium chloride, albuterol, camphor-menthol, loratadine, sodium chloride flush   Assessment/Plan: [name] [name] is a 60yo woman w/ PMHx of SLE, ESRD, recurrent C. Dif, and multiple other co- morbidities, admitted for AMS, severe hypoglycemia, and pneumonia.   Hypoglycemia: Most likely related to infection and end stage liver disease. No previous h/o DM, cortisol 13.5. Her blood sugars are stable this morning while on the D10 gtt. Would expect her blood sugars to be higher while receiving D10. Family seems amenable to hospice care and I will discuss this option with her daughter, [name] [name], tomorrow. I highly doubt we will be able to get her off the D10 gtt.  - Continue D10 @ 10 cc/hr if able. Use oral glucose gel if needed. - Regular diet - Continue CBG's every 4 hours - ABx as below - Follow cultures  GI Bleeding: She had a large bloody bowel movement on evening of 4/6. Hgb and platelets stable. She has not had any recurrent bleeding.  - Continue to monitor for bleeding - CBC in AM  HCAP: LLL pneumonia most prominent in the lateral view. Recent hospitalization, immunocompromised, needs coverage for MDR organisms. Given clinical appearance on exam, do not think patient requires Vancomycin for MRSA pneumonia coverage.   - Continue Cefepime for now - Follow blood cultures, NGTD - Supplemental O2 prn  Chronic Thrombocytopenia: Platelets 37,000 on admission, then dropped to 18,000>14,000. Her thrombocytopenia is likely multifactorial in etiology, including cirrhosis,  SLE (was on azathioprine, but stopped last admission), infection. Last hospitalization her platelets were thought to drop due to Vancomycin.  - Continue to monitor closely for bleeding - CBC in AM  Recent Resistant C. Diff Infection: Discharged less than a week ago for this clinically improved, just followed with Dr. Storm Frisk office 09/07/15.  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 Recently had successful declot of her AVF by IR on 09/01/15  - Renal following, appreciate recommendations  Protein and calorie malnutrition: Albumin < 1.0 on admission. She is not taking in much PO intake. Will continue to encourage her to eat.  - Continue feeding supplements - Regular diet  SLE: Azathioprine was recently d/c'ed due to worsening severe thrombocytopenia and normal dsDNA on last admission. Without evidence of a flare currently. - Continue hydroxychloroquine if patient able to tolerate PO meds  Seizure disorder: Stable - Continue Keppra if able to tolerate PO  Depression: Stable - Continue Prozac if able to tolerate PO.   Chronic Pancreatitis: Stable - Continue Creon if able to tolerate PO  Advanced Care Discussion: Her health has significantly declined over the past year and she has had multiple hospital admissions in the last 6 months. We had a good GOC discussion with her and her family yesterday (4/7)- see documentation from 4/7- although Ms. Pinson was not able to participate due to her lethargy. I think the family is realizing the severity of her condition. I will discuss pursuing hospice care with her daughter tomorrow morning.   Diet: Regular  DVT/PE PPx: SCD's Dispo: Disposition is deferred at this time, awaiting improvement of current medical problems. Likely discharge to hospice in next few days.   The patient does have a current PCP Shela Leff, MD) and does need an [facility] hospital follow-up appointment after  discharge.  The patient does have transportation limitations that hinder transportation to clinic appointments.  .Services Needed at time of discharge: Y = Yes, Blank = No PT:   OT:   RN:   Equipment:   Other:     LOS: 2 days   Juliet Rude, MD 09/11/2015, 9:29 AM

## 2015-09-11 NOTE — Progress Notes (Signed)
Critical lab result obtained: Hemoglobin 6.3.  Nephrologist notified.  Potassium of 3.4 received.  Acid bath switched to 4K per standing orders.  Will continue to monitor.

## 2015-09-11 NOTE — Progress Notes (Signed)
Patient arrived to unit per bed.  Reviewed treatment plan and this RN agrees.  Report received from bedside RN, Joycelyn Schmid.  Consent verified.  Patient lethargic, opens eyes to voice. Lung sounds diminished to ausculation in all fields. No edema. Cardiac: NSR, .  Prepped LUAVG with alcohol and cannulated with two 16 gauge needles.  Pulsation of blood noted.  Flushed access well with saline per protocol.  Connected and secured lines and initiated tx at 1555.  UF goal of 500 mL and net fluid removal of 0 mL.  Will continue to monitor.

## 2015-09-11 NOTE — Progress Notes (Signed)
Dialysis treatment completed.  345 mL ultrafiltrated and net fluid removal -655 mL.    Patient non cooperative, restless, confused. Lung sounds diminished to ausculation in all fields. No edema. Cardiac: NSR.  Disconnected lines and removed needles.  Pressure held for 10 minutes and band aid/gauze dressing applied.  Report given to bedside RN, Joycelyn Schmid.   After re accessing arterial site, pt restless and non cooperative.  Refusing to lay still for completion of treatment.  Frequent arterial alarms and stop/start of BFR interfered with treatment.  Blood returned and report called.  Time of remaining treatment: 55 minutes.  This RN unable to administer antibiotic and unable to administer blood (2 units ordered are not available to RN at this time).  Physician notified.

## 2015-09-11 NOTE — Progress Notes (Signed)
Patient pulled arterial needle.  Tx paused and pressure held.  Access regained and tx resumed.  Dr. Jonnie Finner notified.  Verbal orders received to D/C treatment if further non compliance per patient results in safety issues.  Will continue to monitor.

## 2015-09-12 ENCOUNTER — Telehealth: Payer: Self-pay | Admitting: Internal Medicine

## 2015-09-12 LAB — CBC
HCT: 35.6 % — ABNORMAL LOW (ref 36.0–46.0)
Hemoglobin: 12 g/dL (ref 12.0–15.0)
MCH: 28.9 pg (ref 26.0–34.0)
MCHC: 33.7 g/dL (ref 30.0–36.0)
MCV: 85.8 fL (ref 78.0–100.0)
PLATELETS: 19 10*3/uL — AB (ref 150–400)
RBC: 4.15 MIL/uL (ref 3.87–5.11)
RDW: 21.6 % — AB (ref 11.5–15.5)
WBC: 7.2 10*3/uL (ref 4.0–10.5)

## 2015-09-12 LAB — IRON AND TIBC: Iron: 70 ug/dL (ref 28–170)

## 2015-09-12 LAB — GLUCOSE, CAPILLARY
GLUCOSE-CAPILLARY: 74 mg/dL (ref 65–99)
Glucose-Capillary: 83 mg/dL (ref 65–99)
Glucose-Capillary: 75 mg/dL (ref 65–99)
Glucose-Capillary: 75 mg/dL (ref 65–99)

## 2015-09-12 LAB — RENAL FUNCTION PANEL
Anion gap: 8 (ref 5–15)
BUN: <5 mg/dL — ABNORMAL LOW (ref 6–20)
CALCIUM: 7.6 mg/dL — AB (ref 8.9–10.3)
CO2: 26 mmol/L (ref 22–32)
CREATININE: 2.14 mg/dL — AB (ref 0.44–1.00)
Chloride: 102 mmol/L (ref 101–111)
GFR calc non Af Amer: 24 mL/min — ABNORMAL LOW (ref >60)
GFR, EST AFRICAN AMERICAN: 28 mL/min — AB (ref >60)
GLUCOSE: 76 mg/dL (ref 65–99)
PHOSPHORUS: 1.7 mg/dL — AB (ref 2.5–4.6)
Potassium: 3.5 mmol/L (ref 3.5–5.1)
Sodium: 136 mmol/L (ref 135–145)

## 2015-09-12 MED ORDER — MORPHINE SULFATE 25 MG/ML IV SOLN
0.5000 mg/h | INTRAVENOUS | Status: DC
  Administered 2015-09-12: 0.5 mg/h via INTRAVENOUS
  Filled 2015-09-12: qty 10

## 2015-09-12 NOTE — NC FL2 (Signed)
[place] LEVEL OF CARE SCREENING TOOL     IDENTIFICATION  Patient Name: [name] [name] Birthdate: 11-Jan-1956 Sex: female Admission Date (Current Location): 09/07/2015  [facility] and [place] Number:  Herbalist and Address:  [facility] [place], [place] [address], [place], [place] [address]      Provider Number: [id number]  Attending Physician Name and Address:  Bartholomew Crews, MD  Relative Name and Phone Number:  Madaline Savage, sister, [phone]    Current Level of Care: Hospital Recommended Level of Care: [place] Prior Approval Number:    Date Approved/Denied:   PASRR Number: [id number]  Discharge Plan: SNF    Current Diagnoses: Patient Active Problem List   Diagnosis Date Noted  . Hypoglycemia 09/07/2015  . 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 Pressure ulcer 08/28/2015  . Alcoholic cirrhosis of liver without ascites ([place])   . Persistent fever   . Pancytopenia ([place]) 08/23/2015  . Thrombocytopenia ([place]) 08/23/2015  . Fever and chills   . Anemia due to bone marrow failure ([place])   . Cirrhosis ([place])   . Recurrent Clostridium difficile diarrhea 08/19/2015  . B12 deficiency 08/19/2015  . Influenza A 08/06/2015  . C. difficile diarrhea 08/05/2015  . Infection due to ESBL-producing Escherichia coli 08/05/2015  . Tricuspid regurgitation 02/11/2015  . Essential hypertension   . 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 Lactic acidosis   . ESRD (end stage renal disease) on dialysis ([place])   . Coagulopathy ([place])   . Lupus (systemic lupus erythematosus) ([place]) 06/23/2014  . Anemia of chronic disease 06/20/2014  . Folate deficiency 06/20/2014  . Prolonged Q-T interval on ECG 06/20/2014  . Systolic and diastolic CHF, chronic ([place]) 12/06/2013  . Pericardial effusion 12/03/2013  . 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 Healthcare maintenance 01/30/2013  . Seasonal allergies 01/30/2013  . Alcohol dependence ([place])  11/06/2012  . 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 Severe protein-calorie malnutrition ([place]) 08/14/2012  . Hypoalbuminemia 08/14/2012  . Seizure disorder ([place]) 09/07/2011  . 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 Chronic pain disorder 06/28/2011  . Chronic pancreatitis ([place]) 05/18/2009  . HYPERTRIGLYCERIDEMIA 11/03/2008  . Vitamin D deficiency 03/02/2008  . GERD 03/02/2008  . 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 Depression 09/12/2006  . Alcohol abuse 07/02/2006    Orientation RESPIRATION BLADDER Height & Weight     Self, Time, Place  O2 Incontinent Weight: 72 lb 4.8 oz (32.795 kg) Height:  5\' 1"  (154.9 cm)  BEHAVIORAL SYMPTOMS/MOOD NEUROLOGICAL BOWEL NUTRITION STATUS      Incontinent Diet  AMBULATORY STATUS COMMUNICATION OF NEEDS Skin   Limited Assist Verbally PU Stage and Appropriate Care   PU Stage 2 Dressing: No Dressing (Right and Medial Buttocks - dressing change PRN)                   Personal Care Assistance Level of Assistance  Bathing, Feeding, Dressing Bathing Assistance: Limited assistance Feeding assistance: Independent Dressing Assistance: Limited assistance     Functional Limitations Info             SPECIAL CARE FACTORS FREQUENCY  PT (By licensed PT), OT (By licensed OT)     PT Frequency: 3 (3) OT Frequency: 3            Contractures Contractures Info: Not present    Additional Factors Info  Code Status, Allergies Code Status Info:  (Full) Allergies Info: Amitriptyline Hcl, Doxycycline Hyclate  Isolation Precautions Info: MRSA by pcr 09/17/14     Current Medications (09/12/2015):  This is the current hospital active medication list Current Facility-Administered Medications  Medication Dose Route Frequency Provider Last Rate Last Dose  . 0.9 %  sodium chloride infusion  250 mL Intravenous PRN Milagros Loll, MD      . 0.9 %  sodium chloride infusion   Intravenous Once Roney Jaffe, MD      . acidophilus  (RISAQUAD) capsule 1 capsule  1 capsule Oral Daily Bartholomew Crews, MD   1 capsule at 09/12/15 1053  . albuterol (PROVENTIL) (2.5 MG/3ML) 0.083% nebulizer solution 2.5 mg  2.5 mg Nebulization Q4H PRN Milagros Loll, MD      . antiseptic oral rinse (CPC / CETYLPYRIDINIUM CHLORIDE 0.05%) solution 7 mL  7 mL Mouth Rinse BID Bartholomew Crews, MD   7 mL at 09/12/15 1000  . camphor-menthol (SARNA) lotion 1 application  1 application Topical Daily PRN Milagros Loll, MD      . Derrill Memo ON 09/13/2015] ceFEPIme (MAXIPIME) 2 g in dextrose 5 % 50 mL IVPB  2 g Intravenous Q M,W,F-HD Lyndee Leo, RPH      . [START ON 09/15/2015] Darbepoetin Alfa (ARANESP) injection 200 mcg  200 mcg Intravenous Q Wed-HD Ernest Haber, PA-C   200 mcg at 09/08/15 1730  . dextrose 10 % infusion   Intravenous Continuous Juliet Rude, MD 20 mL/hr at 09/12/15 0800    . feeding supplement (ENSURE ENLIVE) (ENSURE ENLIVE) liquid 237 mL  237 mL Oral TID BM Roney Jaffe, MD   237 mL at 09/12/15 1000  . FLUoxetine (PROZAC) capsule 10 mg  10 mg Oral Daily Corky Sox, MD   10 mg at 09/12/15 1052  . folic acid (FOLVITE) tablet 1 mg  1 mg Oral Daily Milagros Loll, MD   1 mg at 09/12/15 1053  . hydroxychloroquine (PLAQUENIL) tablet 400 mg  400 mg Oral Daily Corky Sox, MD   400 mg at 09/12/15 1053  . levETIRAcetam (KEPPRA) tablet 250 mg  250 mg Oral Daily Juliet Rude, MD   250 mg at 09/11/15 1900  . levETIRAcetam (KEPPRA) tablet 500 mg  500 mg Oral Q M,W,F-2000 Bartholomew Crews, MD   500 mg at 09/10/15 1954  . lidocaine (XYLOCAINE) 5 % ointment   Topical Daily PRN Juliet Rude, MD      . lipase/protease/amylase (CREON) capsule 12,000 Units  12,000 Units Oral TID [facility] Corky Sox, MD   12,000 Units at 09/12/15 1053  . loratadine (CLARITIN) tablet 10 mg  10 mg Oral Daily PRN Milagros Loll, MD      . magnesium oxide (MAG-OX) tablet 400 mg  400 mg Oral Daily Corky Sox, MD   400 mg at 09/12/15 1053  . magnesium sulfate  IVPB 1 g 100 mL  1 g Intravenous Once Milagros Loll, MD   1 g at 09/08/15 1522  . multivitamin with minerals tablet 1 tablet  1 tablet Oral Daily Milagros Loll, MD   1 tablet at 09/12/15 1053  . pantoprazole (PROTONIX) injection 40 mg  40 mg Intravenous Q24H Milagros Loll, MD   40 mg at 09/12/15 0020  . phytonadione (VITAMIN K) tablet 5 mg  5 mg Oral Once Norval Gable, MD   5 mg at 09/10/15 0127  . ramelteon (ROZEREM) tablet 8 mg  8 mg Oral QHS Milagros Loll, MD  8 mg at 09/10/15 2145  . sodium chloride flush (NS) 0.9 % injection 3 mL  3 mL Intravenous Q12H Milagros Loll, MD   3 mL at 09/08/15 1053  . sodium chloride flush (NS) 0.9 % injection 3 mL  3 mL Intravenous Q12H Milagros Loll, MD   3 mL at 09/10/15 2146  . sodium chloride flush (NS) 0.9 % injection 3 mL  3 mL Intravenous PRN Milagros Loll, MD      . thiamine (VITAMIN B-1) tablet 100 mg  100 mg Oral Daily Milagros Loll, MD   100 mg at 09/12/15 1053  . vitamin B-12 (CYANOCOBALAMIN) tablet 250 mcg  250 mcg Oral QPM Milagros Loll, MD   250 mcg at 09/10/15 1648     Discharge Medications: Please see discharge summary for a list of discharge medications.  Relevant Imaging Results:  Relevant Lab Results:   Additional Information [id number]  Ludwig Clarks, LCSW

## 2015-09-12 NOTE — Telephone Encounter (Signed)
I called the patient's daughter, Aldeen Dury, and discussed her mother's poor prognosis as she is altered, not tolerating HD, failing to thrive, remains hypoglycemic without support of D10 drip, and unable to take in nutrition. We discussed that it is appropriate to pursue hospice care and place her on comfort measures (including stopping all medications) at this time and [name] agrees. We also discussed code status and [name] agrees that it is appropriate to change her status to DNR. I have made these changes. Awaiting to hear from [facility] representative.  Albin Felling, MD, MPH Internal Medicine Resident, PGY-II Pager: [phone]

## 2015-09-12 NOTE — Progress Notes (Signed)
Notes that primary team has discussed EOL issues with family; patient is now full comfort care and DNR status.  Plan from renal standpoint is no further dialysis. Will sign off. Please call as needed.   Kelly Splinter MD [employer] pager [phone]    cell [phone] 09/12/2015, 12:46 PM

## 2015-09-12 NOTE — Progress Notes (Signed)
[facility] [employer]  Received request from Dr. Arcelia Jew to speak to family about potential placement at [facility].  Spoke to [place], [place] prior to engaging with family.  Dr. Arcelia Jew and Marcie Bal aware that there is no bed availability at [facility] today.  Discussed with family and patient hospice philosophy and they verbalized good understanding.  They are aware there are no beds today and wish for a [facility] to follow-up with tomorrow regarding bed availability.  Left contact information if any additional questions arise before tomorrow.   Thank you, Freddi Starr RN, BSN [phone]

## 2015-09-12 NOTE — Progress Notes (Signed)
Pt severely agitated and pulling at lines, biting off safety mitts and completely uncooperative with staff and not following instructions. Also of note is pt is mildly hypothermic. MD on call notified and orders received and implemented. Will continue to monitor pt.

## 2015-09-12 NOTE — Progress Notes (Signed)
MD on call notified that labs were unobtainable by phlebotomy at this time, despite 2 separate attempts to obtain specimen.

## 2015-09-12 NOTE — Progress Notes (Signed)
Hypoglycemic Event  CBG: 56   Treatment: D50 IV 25 mL  Symptoms: Nervous/irritable   Possible Reasons for Event: Inadequate meal intake and Medication regimen: pt was in HD and didn't receive IV fluids with dextrose  Comments/MD notified:Patel, MD notified    Reynold Bowen

## 2015-09-12 NOTE — Progress Notes (Addendum)
Subjective:  Was restless/ agitated during HD yesterday, refused to lay still making it near impossilbe to keep needles in proper positioning. Frequent alarming and stoppage of blood flow due to movement. Got only 2/3 of dialysis due to this.  Agitated post- HD as well per RN notes.  Today is calm.   Chart review: 2014 - SLE, bilat leg edema, etoh abuse, seizure d/o, AKI, high K, vol excess 2014 - [place] admit for etoh dependence 2014 - etoh dependence/ detox, c/b pancreatitis 2015 - alochol abuse, UGIB, asp PNA, AKI, anemia 2015 - sepsis, GI bleed, etoh abuse, FTT severe PCM, HCAP 2015 - resp failure d/t pulm edema, diast CHF, a/c renal failure, hypotension, anemia 2015 - acut/ chron CKD , FSGS, high K, pericardial effusion underwent pencard window. Low Ca/ Mg, HTN. Hx etoh.  Jan 2016 - back pain / fatigue > gen weakness/ low Mg from ETOH abuse. Intoxicated on admission. Rx IV Mg. Long hx etoh. CKD4 Apr 2016 - SOB/ CP > HCAP rx with IV abx. Diarrhea, cdif neg. A/c renal failure.  May 2016 - fluid overlaod, CKD 4 started on dialysis. Pred/ plaquenil for SLE. Not candidate for AVF/ AVG due to severe malnutrtion.  Jul 2016 - pain / swelling over recently placed AV graft. Seen by [facility] , not infected /rx locally.  Dec 2016 - LOC, etoh intoxication, hx of DT's. ESRD on HD. SLE Jan 2017 - fever w/u negative. ESRD on HD/ SLE Feb 2017 - hematemesis, etoh abuse. Fevers in hosp, Cdif + diarrhea , rx vanc 7 days. ESBL Ecoli UTI.  Mar 2017 - Cdif infection, influenza A, ESRD/ SLE Mar 2017 - persistent Cdif infection w diarrhea, ESRD. SLE, severe PCM, FTT, chron pancreatitis, etoh abuse  Objective Vital signs in last 24 hours: Filed Vitals:   09/12/15 0400 09/12/15 0420 09/12/15 0745 09/12/15 0800  BP: 114/76  111/72 112/74  Pulse: 68  72 76  Temp: 99.5 F (37.5 C)  99.5 F (37.5 C)   TempSrc: Oral  Oral   Resp: 17  15 12   Height:      Weight:  32.795 kg (72 lb 4.8 oz)    SpO2: 98%  100% 100%    Weight change: 3.298 kg (7 lb 4.3 oz)  Physical Exam: General: lethargic, very weak Heart: RRR, no mur,rub Lungs: R cTA / L base Rales Abdomen: soft, nt, Nd, Incres BS Extremities: Dialysis Access: LUA AVF pos bruit no edema Neuro= none focal / Gen weakness   Dialysis: [facility]    3.5h   34.5kg   4/2.25 bath   Hep 2000  L arm AVG Mircera 200 q 2 wks next 3/1 Calcitriol 1.25  On hold sec to ^ ca Recent labs: last hosp hgb 9.4 Fe 86 no tsat ferritin 1684 iPTH 298 Ca 7.8 Phos 2.9 Alb <1.0  Assessment: 1. ESRD - HD MWF 2. Hypoglycemia- per admit 3. PNA LLL - on abx 4. Encephalopathy-  Multifactorial/ FTT 5. FTT - per primary 6. HTN/volume - no meds, under dry wt 7. Anemia of ESRD - hgb 9.5 aranesp q wed hd   8. Secondary hyperparathyroidism - low phos 1.4/ No binder/ ^ ca hld po vit d / red diet 9. Protein / Calorie Malnutrition -  Alb<1.0 on MVI/ nutr supp 10. SLE- On meds per admit =hydroxychloroquine 11. Depression  12. Recent CDif infection - on acidophilus probiotic 13. EOL - primary team having [place] meeting w family +/- patient today   Plan -  not sure if patient will tolerate further dialysis.  Severe malnutrition and FTT.   Discussions underway with family regarding transition to comfort care.  Will hold off on dialysis pending results of these discussions.   Kelly Splinter MD [facility] pager [phone]    cell [phone] 09/12/2015, 10:26 AM       Labs: Basic Metabolic Panel:  Recent Labs Lab 09/09/15 0254 09/11/15 1600 09/12/15 0647  NA 134* 130* 136  K 4.3 3.4* 3.5  CL 101 98* 102  CO2 24 24 26   GLUCOSE 81 109* 76  BUN <5* 7 <5*  CREATININE 2.91* 3.74* 2.14*  CALCIUM 7.7* 7.8* 7.6*  PHOS 1.8* 1.9* 1.7*   Liver Function Tests:  Recent Labs Lab 09/07/15 1816 09/08/15 0649 09/11/15 1600 09/12/15 0647  AST 90*  --   --   --   ALT 26  --   --   --   ALKPHOS 87  --   --   --   BILITOT 3.8*  --   --   --   PROT 6.6  --   --    --   ALBUMIN <1.0* <1.0* <1.0* <1.0*    Recent Labs Lab 09/07/15 1816  LIPASE 21    Recent Labs Lab 09/07/15 1825  AMMONIA 134*   CBC:  Recent Labs Lab 09/07/15 1816 09/08/15 0649 09/09/15 0254 09/09/15 2320 09/11/15 1600 09/12/15 0647  WBC 5.8 7.8 6.3 6.8 6.4 7.2  NEUTROABS 3.6  --   --   --   --   --   HGB 8.9* 9.5* 8.9* 7.9* 6.3* 12.0  HCT 27.1* 28.6* 28.2* 24.0* 19.7* 35.6*  MCV 101.5* 100.4* 99.6 99.2 97.5 85.8  PLT 37* 36* 18* 14* 25* 19*   Cardiac Enzymes:  Recent Labs Lab 09/07/15 1816  TROPONINI <0.03   CBG:  Recent Labs Lab 09/10/15 2348 09/11/15 2020 09/11/15 2346 09/12/15 0358 09/12/15 0742  GLUCAP 109* 56* 120* 83 74    Studies/Results: No results found. Medications: . dextrose 20 mL/hr at 09/12/15 0355   . sodium chloride   Intravenous Once  . acidophilus  1 capsule Oral Daily  . antiseptic oral rinse  7 mL Mouth Rinse BID  . [START ON 09/13/2015] ceFEPime (MAXIPIME) IV  2 g Intravenous Q M,W,F-HD  . [START ON 09/15/2015] darbepoetin (ARANESP) injection - DIALYSIS  200 mcg Intravenous Q Wed-HD  . feeding supplement (ENSURE ENLIVE)  237 mL Oral TID BM  . FLUoxetine  10 mg Oral Daily  . folic acid  1 mg Oral Daily  . hydroxychloroquine  400 mg Oral Daily  . levETIRAcetam  250 mg Oral Daily  . levETIRAcetam  500 mg Oral Q M,W,F-2000  . lipase/protease/amylase  12,000 Units Oral TID AC  . magnesium oxide  400 mg Oral Daily  . magnesium sulfate 1 - 4 g bolus IVPB  1 g Intravenous Once  . multivitamin with minerals  1 tablet Oral Daily  . pantoprazole (PROTONIX) IV  40 mg Intravenous Q24H  . phytonadione  5 mg Oral Once  . ramelteon  8 mg Oral QHS  . sodium chloride flush  3 mL Intravenous Q12H  . sodium chloride flush  3 mL Intravenous Q12H  . thiamine  100 mg Oral Daily  . vitamin B-12  250 mcg Oral QPM

## 2015-09-12 NOTE — Progress Notes (Signed)
Subjective: Did not tolerate dialysis well yesterday due to agitation, only completing 2/3 of the session. We tried to wean down on the D10 gtt but she became hypoglycemic in the 60s. She continues to not take in PO intake. I called her daughter, [name] [name], and discussed her mother's declining clinical status in the setting of severe malnutrition, failure to thrive, and unable to tolerate HD. [name] [name] has agreed that pursuing hospice care is the best option for her mother. She agreed to change her mother's code status to DNR and to stop any medications that are not contributing to comfort.   Objective: Vital signs in last 24 hours: Filed Vitals:   09/12/15 0400 09/12/15 0420 09/12/15 0745 09/12/15 0800  BP: 114/76  111/72 112/74  Pulse: 68  72 76  Temp: 99.5 F (37.5 C)  99.5 F (37.5 C)   TempSrc: Oral  Oral   Resp: 17  15 12   Height:      Weight:  72 lb 4.8 oz (32.795 kg)    SpO2: 98%  100% 100%   Weight change: 7 lb 4.3 oz (3.298 kg)  Intake/Output Summary (Last 24 hours) at 09/12/15 1119 Last data filed at 09/12/15 1000  Gross per 24 hour  Intake   1224 ml  Output   -655 ml  Net   1879 ml   Physical Exam: General: Cachectic appearing woman, lethargic, minimally interactive  HEENT: EOMI, mucus membranes moist  CV: RRR, 2/4 diastolic murmur  Lungs: CTA bilaterally, breaths non-labored  Abdomen: BS+, soft, non-tender  Ext: LUE AVF, good thrill and bruit. No peripheral edema.  Neurologic: Lethargic, will open eyes to voice. Will moan or have one word answers.   Lab Results: Basic Metabolic Panel:  Recent Labs Lab 09/08/15 0220  09/09/15 0254 09/11/15 1600 09/12/15 0647  NA  --   < > 134* 130* 136  K  --   < > 4.3 3.4* 3.5  CL  --   < > 101 98* 102  CO2  --   < > 24 24 26   GLUCOSE  --   < > 81 109* 76  BUN  --   < > <5* 7 <5*  CREATININE  --   < > 2.91* 3.74* 2.14*  CALCIUM  --   < > 7.7* 7.8* 7.6*  MG 1.6*  --  1.5*  --   --   PHOS 1.8*  < > 1.8* 1.9* 1.7*  < > =  values in this interval not displayed. Liver Function Tests:  Recent Labs Lab 09/07/15 1816  09/11/15 1600 09/12/15 0647  AST 90*  --   --   --   ALT 26  --   --   --   ALKPHOS 87  --   --   --   BILITOT 3.8*  --   --   --   PROT 6.6  --   --   --   ALBUMIN <1.0*  < > <1.0* <1.0*  < > = values in this interval not displayed.  Recent Labs Lab 09/07/15 1816  LIPASE 21    Recent Labs Lab 09/07/15 1825  AMMONIA 134*   CBC:  Recent Labs Lab 09/07/15 1816  09/11/15 1600 09/12/15 0647  WBC 5.8  < > 6.4 7.2  NEUTROABS 3.6  --   --   --   HGB 8.9*  < > 6.3* 12.0  HCT 27.1*  < > 19.7* 35.6*  MCV 101.5*  < > 97.5  85.8  PLT 37*  < > 25* 19*  < > = values in this interval not displayed. Cardiac Enzymes:  Recent Labs Lab 09/07/15 1816  TROPONINI <0.03   CBG:  Recent Labs Lab 09/10/15 2003 09/10/15 2348 09/11/15 2020 09/11/15 2346 09/12/15 0358 09/12/15 0742  GLUCAP 113* 109* 56* 120* 83 74   Coagulation:  Recent Labs Lab 09/07/15 1816 09/09/15 2320  LABPROT 27.4* 29.5*  INR 2.58* 2.86*   Urine Drug Screen: Drugs of Abuse     Component Value Date/Time   LABOPIA NONE DETECTED 07/16/2015 1400   LABOPIA NEG 09/18/2011 0936   COCAINSCRNUR NONE DETECTED 07/16/2015 1400   COCAINSCRNUR NEG 09/18/2011 0936   LABBENZ NONE DETECTED 07/16/2015 1400   LABBENZ NEG 09/18/2011 0936   LABBENZ NEG 04/10/2011 1130   AMPHETMU NONE DETECTED 07/16/2015 1400   AMPHETMU NEG 09/18/2011 0936   AMPHETMU NEG 04/10/2011 1130   THCU NONE DETECTED 07/16/2015 1400   THCU NEG 09/18/2011 0936   LABBARB NONE DETECTED 07/16/2015 1400   LABBARB NEG 09/18/2011 0936    Alcohol Level:  Recent Labs Lab 09/08/15 0219  ETH <5   Medications: I have reviewed the patient's current medications. Scheduled Meds: . sodium chloride   Intravenous Once  . acidophilus  1 capsule Oral Daily  . antiseptic oral rinse  7 mL Mouth Rinse BID  . [START ON 09/13/2015] ceFEPime (MAXIPIME) IV  2  g Intravenous Q M,W,F-HD  . [START ON 09/15/2015] darbepoetin (ARANESP) injection - DIALYSIS  200 mcg Intravenous Q Wed-HD  . feeding supplement (ENSURE ENLIVE)  237 mL Oral TID BM  . FLUoxetine  10 mg Oral Daily  . folic acid  1 mg Oral Daily  . hydroxychloroquine  400 mg Oral Daily  . levETIRAcetam  250 mg Oral Daily  . levETIRAcetam  500 mg Oral Q M,W,F-2000  . lipase/protease/amylase  12,000 Units Oral TID AC  . magnesium oxide  400 mg Oral Daily  . magnesium sulfate 1 - 4 g bolus IVPB  1 g Intravenous Once  . multivitamin with minerals  1 tablet Oral Daily  . pantoprazole (PROTONIX) IV  40 mg Intravenous Q24H  . phytonadione  5 mg Oral Once  . ramelteon  8 mg Oral QHS  . sodium chloride flush  3 mL Intravenous Q12H  . sodium chloride flush  3 mL Intravenous Q12H  . thiamine  100 mg Oral Daily  . vitamin B-12  250 mcg Oral QPM   Continuous Infusions: . dextrose 20 mL/hr at 09/12/15 0800   PRN Meds:.sodium chloride, albuterol, camphor-menthol, lidocaine, loratadine, sodium chloride flush   Assessment/Plan: [name] [name] is a 60yo woman w/ PMHx of SLE, ESRD, recurrent C. Dif, and multiple other co- morbidities, admitted for AMS, severe hypoglycemia, and pneumonia.   Hypoglycemia: Most likely related to end stage liver disease. No previous h/o DM, cortisol 13.5. Her blood sugars decreased to the 60s when the D10 gtt rate was decreased. Family agrees hospice care is the most appropriate option for her at this point as she is clinically deteriorating. I will continue the D10 gtt for now until I hear back from hospice and so that her family has some time to spend with her. Will make her full comfort care.  - Continue D10 @ 20 cc/hr  - Regular diet, comfort feeds - Stop [facility] contacted. Awaiting to hear from their admissions staff.   GI Bleeding: She had a large bloody bowel movement on evening of 4/6.  Hgb dropped to 6.3 last night and she received 2 units PRBCs. She has not  had any recurrent GI bleeding. Will stop all blood draws.  - Will hold off on labs  - Start morphine 0.5 mg/hr gtt for comfort   HCAP: LLL pneumonia most prominent in the lateral view. Will stop antibiotics at this time as pursuing full comfort care.  - Stop Cefepime - Supplemental O2 prn  Chronic Thrombocytopenia: Platelets 37,000 on admission, then dropped to 18,000>14,000>25,000>19,000. Her thrombocytopenia is likely multifactorial in etiology, including cirrhosis, SLE (was on azathioprine, but stopped last admission), infection. Last hospitalization her platelets were thought to drop due to Vancomycin.  - Stop all blood draws  Recent Resistant C. Diff Infection: Discharged less than a week ago for this clinically improved, just followed with Dr. Storm Frisk office 09/07/15.  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 Recently had successful declot of her AVF by IR on 09/01/15  - Renal following, appreciate recommendations - I spoke with Dr. Jonnie Finner and he agrees that the patient is not tolerating HD and that hospice care is appropriate. Will stop HD.   Protein and calorie malnutrition: Albumin < 1.0 on admission. She is not taking in much PO intake. She can have comfort feeds. - Continue feeding supplements - Regular diet  SLE: Azathioprine was recently d/c'ed due to worsening severe thrombocytopenia and normal dsDNA on last admission. Without evidence of a flare currently. - Stop hydroxychloroquine   Seizure disorder: Stable - Stop Keppra  Depression: Stable - Stop prozac  Chronic Pancreatitis: Stable - Stop Creon  Advanced Care Discussion: Her health has significantly declined over the past year and she has had multiple hospital admissions in the last 6 months. We had a good GOC discussion with her and her family on 4/7- see documentation from 4/7- although Ms. Scaffidi was not able to participate due to her lethargy. I think the family is realizing the severity of  her condition. I discussed her clinical status again with [name] [name] today and she agrees that hospice and full comfort care is appropriate. We also discussed code status and agree that it is in Ms. Guastella best interest to be made DNR.   Diet: Regular  DVT/PE PPx: Not indicated  Code: DNR Dispo: Disposition is deferred at this time, awaiting improvement of current medical problems. Likely discharge to hospice in next few days.   The patient does have a current PCP Shela Leff, MD) and does need an [facility] hospital follow-up appointment after discharge.  The patient does have transportation limitations that hinder transportation to clinic appointments.  .Services Needed at time of discharge: Y = Yes, Blank = No PT:   OT:   RN:   Equipment:   Other:     LOS: 3 days   Juliet Rude, MD 09/12/2015, 11:19 AM

## 2015-09-13 ENCOUNTER — Other Ambulatory Visit: Payer: Self-pay | Admitting: *Deleted

## 2015-09-13 ENCOUNTER — Encounter: Payer: Self-pay | Admitting: *Deleted

## 2015-09-13 DIAGNOSIS — Z66 Do not resuscitate: Secondary | ICD-10-CM | POA: Diagnosis not present

## 2015-09-13 DIAGNOSIS — K219 Gastro-esophageal reflux disease without esophagitis: Secondary | ICD-10-CM | POA: Diagnosis not present

## 2015-09-13 DIAGNOSIS — M6281 Muscle weakness (generalized): Secondary | ICD-10-CM | POA: Diagnosis not present

## 2015-09-13 DIAGNOSIS — D649 Anemia, unspecified: Secondary | ICD-10-CM | POA: Diagnosis not present

## 2015-09-13 DIAGNOSIS — F339 Major depressive disorder, recurrent, unspecified: Secondary | ICD-10-CM | POA: Diagnosis not present

## 2015-09-13 DIAGNOSIS — E46 Unspecified protein-calorie malnutrition: Secondary | ICD-10-CM | POA: Diagnosis not present

## 2015-09-13 DIAGNOSIS — Z992 Dependence on renal dialysis: Secondary | ICD-10-CM | POA: Diagnosis not present

## 2015-09-13 DIAGNOSIS — N186 End stage renal disease: Secondary | ICD-10-CM | POA: Diagnosis not present

## 2015-09-13 DIAGNOSIS — N031 Chronic nephritic syndrome with focal and segmental glomerular lesions: Secondary | ICD-10-CM | POA: Diagnosis not present

## 2015-09-13 DIAGNOSIS — K7030 Alcoholic cirrhosis of liver without ascites: Secondary | ICD-10-CM | POA: Diagnosis not present

## 2015-09-13 DIAGNOSIS — F445 Conversion disorder with seizures or convulsions: Secondary | ICD-10-CM | POA: Diagnosis not present

## 2015-09-13 DIAGNOSIS — L932 Other local lupus erythematosus: Secondary | ICD-10-CM | POA: Diagnosis not present

## 2015-09-13 DIAGNOSIS — A047 Enterocolitis due to Clostridium difficile: Secondary | ICD-10-CM | POA: Diagnosis not present

## 2015-09-13 DIAGNOSIS — E162 Hypoglycemia, unspecified: Secondary | ICD-10-CM | POA: Diagnosis not present

## 2015-09-13 DIAGNOSIS — K861 Other chronic pancreatitis: Secondary | ICD-10-CM | POA: Diagnosis not present

## 2015-09-13 LAB — CULTURE, BLOOD (ROUTINE X 2)
CULTURE: NO GROWTH
CULTURE: NO GROWTH

## 2015-09-13 LAB — GLUCOSE, CAPILLARY
GLUCOSE-CAPILLARY: 154 mg/dL — AB (ref 65–99)
Glucose-Capillary: 91 mg/dL (ref 65–99)
Glucose-Capillary: 188 mg/dL — ABNORMAL HIGH (ref 65–99)
Glucose-Capillary: 121 mg/dL — ABNORMAL HIGH (ref 65–99)

## 2015-09-13 LAB — TYPE AND SCREEN
ABO/RH(D): A POS
ANTIBODY SCREEN: NEGATIVE
UNIT DIVISION: 0
Unit division: 0

## 2015-09-13 MED ORDER — DEXTROSE 10 % IV SOLN: 25.0000 mL/h | INTRAVENOUS | Status: AC

## 2015-09-13 MED ORDER — LIDOCAINE 5 % EX OINT: TOPICAL_OINTMENT | Freq: Every day | CUTANEOUS | Status: AC | PRN

## 2015-09-13 MED ORDER — DEXTROSE 5 % IV SOLN: 0.5000 mg/h | INTRAVENOUS | Status: AC

## 2015-09-13 NOTE — Progress Notes (Signed)
Subjective: Not responding this morning.  Resting comfortably in bed.  Full comfort care measures.  Waiting to hear from hospice about bed availability.    Objective: Vital signs in last 24 hours: Filed Vitals:   09/12/15 1205 09/12/15 1600 09/12/15 2037 09/13/15 0445  BP: 143/71 113/71 111/77 99/65  Pulse:   66 55  Temp: 99.4 F (37.4 C)  98.2 F (36.8 C) 97.8 F (36.6 C)  TempSrc: Oral  Oral   Resp: 11 10 12 13   Height:   5\' 1"  (1.549 m)   Weight:   32.8 kg (72 lb 5 oz)   SpO2:  95% 100% 97%   Weight change: -4.2 kg (-9 lb 4.2 oz)  Intake/Output Summary (Last 24 hours) at 09/13/15 1146 Last data filed at 09/13/15 0446  Gross per 24 hour  Intake 182.67 ml  Output      0 ml  Net 182.67 ml   BP 99/65 mmHg  Pulse 55  Temp(Src) 97.8 F (36.6 C) (Oral)  Resp 13  Ht 5\' 1"  (1.549 m)  Wt 32.8 kg (72 lb 5 oz)  BMI 13.67 kg/m2  SpO2 97%   General appearance: no distress and nonresponsive, resting comfortably in bed.    Medications: Scheduled Meds: . sodium chloride   Intravenous Once  . antiseptic oral rinse  7 mL Mouth Rinse BID  . feeding supplement (ENSURE ENLIVE)  237 mL Oral TID BM  . pantoprazole (PROTONIX) IV  40 mg Intravenous Q24H  . sodium chloride flush  3 mL Intravenous Q12H  . sodium chloride flush  3 mL Intravenous Q12H   Continuous Infusions: . dextrose 20 mL/hr at 09/12/15 1340  . morphine 0.5 mg/hr (09/12/15 1340)   PRN Meds:.sodium chloride, camphor-menthol, lidocaine, sodium chloride flush Assessment/Plan: Active Problems:   Hypoglycemia  [name] [name] is a 60 y.o. 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 Now DNR and on comfort measures.  Awaiting bed at hospice.   Community acquired pneumonia:  Comfort measures only --D/C cefepime  Hypoglycemia: Likely due to poor oral intake and  liver failure.Have been unable to ween from D10 drip.  Continuing D10 until we hear back from hospice.  --D10 25 mL/hr; diet and feeding supplements as tolerated with juice/D50/buccal glucose PRN for low CBG.  AMS: Continued lethargy and unresposiveness. Oriented to place only. Awareness waxes and wanes.  --comfort measures only  Thrombocytopenia: Patient has a history of thrombocytopenia. --comfort measures only   GI Bleeding/Epistaxis: --comfort measures only   Advanced Care Planning: Family is aware of patient's discharge and decided on 4/8 to make her comfort care only and DNR.  She is currently awaiting a bed at hospice.    Recent resistant C.dif infection - discharged less than a week ago for this clinically improved, just followed with Dr. 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 Without evidence of a flare currently. --d/c hydroxychloroqine  Seizure disorder - denies recent seizure activity --d/c Keppra.  Dispo:  Awaiting bed at hospice.  Anticipate discharge today or tomorrow pending availability.    This is a Careers information officer Note.  The care of the patient was discussed with Dr. Brent Bulla and the assessment  and plan formulated with their assistance.  Please see their attached note for official documentation of the daily encounter.   LOS: 4 days   Burt Ek, Med Student 09/13/2015, 11:46 AM

## 2015-09-13 NOTE — Progress Notes (Signed)
Subjective: No acute events overnight. She is resting comfortably in bed. On full comfort care measures now. Awaiting to hear from hospice in regards to a bed.   Objective: Vital signs in last 24 hours: Filed Vitals:   09/12/15 1205 09/12/15 1600 09/12/15 2037 09/13/15 0445  BP: 143/71 113/71 111/77 99/65  Pulse:   66 55  Temp: 99.4 F (37.4 C)  98.2 F (36.8 C) 97.8 F (36.6 C)  TempSrc: Oral  Oral   Resp: 11 10 12 13   Height:   5\' 1"  (1.549 m)   Weight:   72 lb 5 oz (32.8 kg)   SpO2:  95% 100% 97%   Weight change: -9 lb 4.2 oz (-4.2 kg)  Intake/Output Summary (Last 24 hours) at 09/13/15 1135 Last data filed at 09/13/15 0446  Gross per 24 hour  Intake 182.67 ml  Output      0 ml  Net 182.67 ml   Physical Exam: General: Cachectic appearing woman, lethargic, resting in bed HEENT: mucus membranes moist  CV: RRR, 2/4 diastolic murmur  Ext: LUE AVF, good thrill and bruit. No peripheral edema.  Neurologic: Lethargic, unresponsive.   Lab Results: Basic Metabolic Panel:  Recent Labs Lab 09/08/15 0220  09/09/15 0254 09/11/15 1600 09/12/15 0647  NA  --   < > 134* 130* 136  K  --   < > 4.3 3.4* 3.5  CL  --   < > 101 98* 102  CO2  --   < > 24 24 26   GLUCOSE  --   < > 81 109* 76  BUN  --   < > <5* 7 <5*  CREATININE  --   < > 2.91* 3.74* 2.14*  CALCIUM  --   < > 7.7* 7.8* 7.6*  MG 1.6*  --  1.5*  --   --   PHOS 1.8*  < > 1.8* 1.9* 1.7*  < > = values in this interval not displayed. Liver Function Tests:  Recent Labs Lab 09/07/15 1816  09/11/15 1600 09/12/15 0647  AST 90*  --   --   --   ALT 26  --   --   --   ALKPHOS 87  --   --   --   BILITOT 3.8*  --   --   --   PROT 6.6  --   --   --   ALBUMIN <1.0*  < > <1.0* <1.0*  < > = values in this interval not displayed.  Recent Labs Lab 09/07/15 1816  LIPASE 21    Recent Labs Lab 09/07/15 1825  AMMONIA 134*   CBC:  Recent Labs Lab 09/07/15 1816  09/11/15 1600 09/12/15 0647  WBC 5.8  < > 6.4 7.2    NEUTROABS 3.6  --   --   --   HGB 8.9*  < > 6.3* 12.0  HCT 27.1*  < > 19.7* 35.6*  MCV 101.5*  < > 97.5 85.8  PLT 37*  < > 25* 19*  < > = values in this interval not displayed. Cardiac Enzymes:  Recent Labs Lab 09/07/15 1816  TROPONINI <0.03   CBG:  Recent Labs Lab 09/11/15 2020 09/11/15 2346 09/12/15 0358 09/12/15 0742 09/12/15 1145 09/12/15 1714  GLUCAP 56* 120* 83 74 75 75   Coagulation:  Recent Labs Lab 09/07/15 1816 09/09/15 2320  LABPROT 27.4* 29.5*  INR 2.58* 2.86*   Urine Drug Screen: Drugs of Abuse     Component Value Date/Time  LABOPIA NONE DETECTED 07/16/2015 1400   LABOPIA NEG 09/18/2011 0936   COCAINSCRNUR NONE DETECTED 07/16/2015 1400   COCAINSCRNUR NEG 09/18/2011 0936   LABBENZ NONE DETECTED 07/16/2015 1400   LABBENZ NEG 09/18/2011 0936   LABBENZ NEG 04/10/2011 1130   AMPHETMU NONE DETECTED 07/16/2015 1400   AMPHETMU NEG 09/18/2011 0936   AMPHETMU NEG 04/10/2011 1130   THCU NONE DETECTED 07/16/2015 1400   THCU NEG 09/18/2011 0936   LABBARB NONE DETECTED 07/16/2015 1400   LABBARB NEG 09/18/2011 0936    Alcohol Level:  Recent Labs Lab 09/08/15 0219  ETH <5   Medications: I have reviewed the patient's current medications. Scheduled Meds: . sodium chloride   Intravenous Once  . antiseptic oral rinse  7 mL Mouth Rinse BID  . feeding supplement (ENSURE ENLIVE)  237 mL Oral TID BM  . pantoprazole (PROTONIX) IV  40 mg Intravenous Q24H  . sodium chloride flush  3 mL Intravenous Q12H  . sodium chloride flush  3 mL Intravenous Q12H   Continuous Infusions: . dextrose 20 mL/hr at 09/12/15 1340  . morphine 0.5 mg/hr (09/12/15 1340)   PRN Meds:.sodium chloride, camphor-menthol, lidocaine, sodium chloride flush   Assessment/Plan: Ms. Foody is a 60yo woman w/ PMHx of SLE, ESRD, recurrent C. Dif, and multiple other co- morbidities, admitted for AMS, severe hypoglycemia, and pneumonia.   Hypoglycemia: Most likely related to end stage  liver disease. Family agrees hospice care is the most appropriate option for her at this point as she is clinically deteriorating. I will continue the D10 gtt for now as I do not want her to have a hypoglycemic seizure. Will not test CBGs as she is full comfort care.  - Continue D10 @ 25 cc/hr  - Regular diet, comfort feeds - Stop [facility] following. Awaiting to hear about bed availability from their admissions staff.  - Continue  morphine 0.5 mg/hr gtt for comfort   GI Bleeding: She had a large bloody bowel movement on evening of 4/6. Hgb dropped to 6.3 and she received 2 units PRBCs. She has not had any recurrent GI bleeding. Will stop all blood draws.  - Will hold off on labs  - Continue  morphine 0.5 mg/hr gtt for comfort   HCAP: LLL pneumonia most prominent in the lateral view. Will stop antibiotics at this time as pursuing full comfort care.  - Supplemental O2 prn  Chronic Thrombocytopenia: Platelets 37,000 on admission, then dropped to 18,000>14,000>25,000>19,000. Her thrombocytopenia is likely multifactorial in etiology, including cirrhosis, SLE (was on azathioprine, but stopped last admission), infection. Last hospitalization her platelets were thought to drop due to Vancomycin.  - Stop all blood draws  Recent Resistant C. Diff Infection: Discharged less than a week ago for this clinically improved, just followed with Dr. Storm Frisk office 09/07/15.  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 Recently had successful declot of her AVF by IR on 09/01/15  - I spoke with Dr. Jonnie Finner and he agrees that the patient is not tolerating HD and that hospice care is appropriate. Will stop HD.   Protein and calorie malnutrition: Albumin < 1.0 on admission. She is not taking in much PO intake. She can have comfort feeds. - Continue feeding supplements - Regular diet  SLE: Azathioprine was recently d/c'ed due to worsening severe thrombocytopenia and normal dsDNA on last  admission. Without evidence of a flare currently. - Stop hydroxychloroquine   Seizure disorder: Stable - Stop Keppra  Depression: Stable - Stop prozac  Chronic Pancreatitis: Stable - Stop Creon  Advanced Care Discussion: Her health has significantly declined over the past year and she has had multiple hospital admissions in the last 6 months. We had a good GOC discussion with her and her family on 4/7- see documentation from 4/7- although Ms. Megginson was not able to participate due to her lethargy. I think the family is realizing the severity of her condition.She is now on full comfort care measures and DNR. Awaiting to hear from [facility] if they have any beds available today and if not, can consider other inpatient hospice facilities.  Diet: Regular  DVT/PE PPx: Not indicated  Code: DNR Dispo: Disposition is deferred at this time, awaiting improvement of current medical problems. Likely discharge to hospice in next few days.   The patient does have a current PCP Shela Leff, MD) and does need an [facility] hospital follow-up appointment after discharge.  The patient does have transportation limitations that hinder transportation to clinic appointments.  .Services Needed at time of discharge: Y = Yes, Blank = No PT:   OT:   RN:   Equipment:   Other:     LOS: 4 days   Juliet Rude, MD 09/13/2015, 11:35 AM

## 2015-09-13 NOTE — Progress Notes (Signed)
Wasted patient's remaining 230 mls of morphine sulfate soln to sink and witnessed by [employer]

## 2015-09-13 NOTE — Progress Notes (Signed)
Nutrition Brief Note  Chart reviewed. Pt now transitioning to comfort care.  No further nutrition interventions warranted at this time.  Please re-consult as needed.   Avary Pitsenbarger A. Lois Slagel, RD, LDN, CDE Pager: [phone] After hours Pager: [phone]  

## 2015-09-13 NOTE — Patient Outreach (Signed)
[facility]) Care Management  09/13/2015  [name] [name] 05/21/1956 [id number]   CSW advised today that patient has been referred to residential hospice for end of life care. CSW will close case at this time.  Eduard Clos, MSW, [place] Worker  [place] [phone]

## 2015-09-13 NOTE — Progress Notes (Signed)
Patient for discharge back to [facility], report was given to nurse Seth Bake Roddy, now awaiting transporter.

## 2015-09-13 NOTE — Progress Notes (Signed)
  Date: 09/13/2015  Patient name: [name] [name]  Medical record number: [id number]  Date of birth: 04-Apr-1956   This patient has been seen and the plan of care was discussed with the house staff. Please see their note for complete details. I concur with their findings with the following additions/corrections: [name] ex is by her bedside. She is currently not responsive. She is comfortable. For SNF with hospice.  Bartholomew Crews, MD 09/13/2015, 4:02 PM

## 2015-09-13 NOTE — Patient Outreach (Signed)
[facility]) Care Management  09/13/2015  [name] [name] 05-27-1956 [id number]   Noted that member remains in hospital and is now on hospice, awaiting bed at [facility].  Social worker, [name], and hospital liaison, [name], notified.  Will close case and notify care management assistant and PCP.  [name], BSN, [place] Management  [facility] Care Manager [phone]

## 2015-09-13 NOTE — Discharge Summary (Signed)
Name: [name] [name] MRN: [id number] DOB: 23-Aug-1955 60 y.o. PCP: Shela Leff, MD  Date of Admission: 09/07/2015  4:32 PM Date of Discharge: 09/13/2015 Attending Physician: Bartholomew Crews, MD  Discharge Diagnosis: Principal Problem Hypoglycemia Advanced Care  Active Problems GI Bleeding HCAP Chronic Thrombocytopenia Recent Resistant C Diff Infection ESRD Protein and calorie malnutrition SLE Seizure Disorder Depression Chronic Pancreatitis   Discharge Medications:   Medication List    STOP taking these medications        ACIDOPHILUS PROBIOTIC PO     albuterol 108 (90 Base) MCG/ACT inhaler  Commonly known as:  PROAIR HFA     azaTHIOprine 50 MG tablet  Commonly known as:  IMURAN     calcium carbonate 500 MG chewable tablet  Commonly known as:  TUMS - dosed in mg elemental calcium     cetirizine 10 MG tablet  Commonly known as:  ZYRTEC     CREON 12000 units Cpep capsule  Generic drug:  lipase/protease/amylase     Darbepoetin Alfa 150 MCG/0.3ML Sosy injection  Commonly known as:  ARANESP     FLUoxetine 10 MG capsule  Commonly known as:  PROZAC     folic acid 1 MG tablet  Commonly known as:  FOLVITE     gabapentin 300 MG capsule  Commonly known as:  NEURONTIN     hydroxychloroquine 200 MG tablet  Commonly known as:  PLAQUENIL     levETIRAcetam 250 MG tablet  Commonly known as:  KEPPRA     loperamide 2 MG capsule  Commonly known as:  IMODIUM     mirtazapine 7.5 MG tablet  Commonly known as:  REMERON     multivitamin with minerals Tabs tablet     nystatin 100000 UNIT/ML suspension  Commonly known as:  MYCOSTATIN     omeprazole 40 MG capsule  Commonly known as:  PRILOSEC     oxyCODONE 5 MG immediate release tablet  Commonly known as:  Oxy IR/ROXICODONE     ramelteon 8 MG tablet  Commonly known as:  ROZEREM     thiamine 100 MG tablet  Commonly known as:  VITAMIN B-1     vitamin B-12 250 MCG tablet  Commonly known as:   CYANOCOBALAMIN      TAKE these medications        camphor-menthol lotion  Commonly known as:  SARNA  Apply 1 application topically daily as needed for itching.     dextrose 10 % infusion  Inject 25 mL/hr into the vein continuous.     feeding supplement (ENSURE ENLIVE) Liqd  Take 237 mLs by mouth 3 (three) times daily with meals.     lidocaine 5 % ointment  Commonly known as:  XYLOCAINE  Apply topically daily as needed for mild pain (for irritation surrounding IV site).     morphine 250 mg in dextrose 5 % 250 mL  Inject 0.5 mg/hr into the vein continuous.        Disposition and follow-up:   [name] [name] was discharged from [facility] in Stable condition.    1.  Hospice: Discharged to SNF with hospice care. Please provide the patient with a morphine gtt at 0.5 mg/hr and D10 drip to prevent hypoglycemic seizures.  2.  Labs / imaging needed at time of follow-up: None  3.  Pending labs/ test needing follow-up: None   Follow-up Appointments:   Discharge Instructions: Discharge Instructions    Diet - low sodium heart healthy  Complete by:  As directed      Increase activity slowly    Complete by:  As directed            Consultations:    Procedures Performed:  Dg Chest 2 View  09/07/2015  CLINICAL DATA:  Hypoglycemia.  Lethargy after dialysis. EXAM: CHEST  2 VIEW COMPARISON:  08/23/2015 FINDINGS: Cardiomegaly and mediastinal contours are unchanged. There is a new opacity in the medial left lower lobe with left pleural effusion. Right lung is clear. No pulmonary edema. No pneumothorax. Chronic deformity of the left proximal humerus. The bones are under mineralized. IMPRESSION: 1. Medial left lung base opacity, concerning for pneumonia. Small associated left pleural effusion. Followup PA and lateral chest X-ray is recommended in 3-4 weeks following trial of antibiotic therapy to ensure resolution and exclude underlying malignancy. 2. Stable  cardiomegaly. Electronically Signed   By: Jeb Levering M.D.   On: 09/07/2015 22:56   Dg Chest 2 View  08/23/2015  CLINICAL DATA:  Fever, chills, diarrhea EXAM: CHEST  2 VIEW COMPARISON:  08/05/2015 FINDINGS: Lungs are clear.  No pleural effusion or pneumothorax. Cardiomegaly. Visualized osseous structures are within normal limits. IMPRESSION: No evidence of acute cardiopulmonary disease. Electronically Signed   By: Julian Hy M.D.   On: 08/23/2015 14:31   Ct Abdomen Pelvis W Contrast  08/29/2015  CLINICAL DATA:  C .  Diff colitis under treatment, persistent fever EXAM: CT ABDOMEN AND PELVIS WITH CONTRAST TECHNIQUE: Multidetector CT imaging of the abdomen and pelvis was performed using the standard protocol following bolus administration of intravenous contrast. CONTRAST:  1 ISOVUE-300 IOPAMIDOL (ISOVUE-300) INJECTION 61% COMPARISON:  07/30/2015 FINDINGS: Lower chest: There is small left pleural effusion. Atelectasis noted in left lower lobe posteriorly. Hepatobiliary: Again noted heterogeneous enhancement of the liver with nodular contour suspicious for cirrhosis. No focal hepatic mass. No intrahepatic biliary ductal dilatation. No calcified gallstones are noted within gallbladder. There is small perihepatic ascites. Pancreas: Again noted multiple pancreatic calcification and dilatation of main pancreatic duct consistent with chronic calcific pancreatitis. No definite focal mass. Spleen: No focal mass.  Trace perisplenic ascites. Adrenals/Urinary Tract: No adrenal gland mass. Enhanced kidneys are symmetrical in size. No hydronephrosis or hydroureter. Mild delay excretion bilateral kidney on delayed images. No focal hepatic mass. Stomach/Bowel: No gastric outlet obstruction. There is no small bowel obstruction. Contrast material noted within colon and rectum. Small free fluid noted in right paracolic gutter. There is small two moderate pelvic ascites. There is residual mild thickening of descending  colon and proximal sigmoid colon wall. Findings probable due to improving colitis. There is no evidence of colonic perforation. No pericolonic abscess or contrast material. Again noted status post right hemicolectomy. No evidence of anastomotic stricture. Vascular/Lymphatic: No aortic aneurysm. No mesenteric adenopathy. No inguinal adenopathy. Small retroperitoneal lymph nodes are stable. Portal vein measures 7.4 mm in diameter without evidence of portal hypertension. Reproductive: The uterus and ovaries are unremarkable. No adnexal mass. Other: There is no evidence of free abdominal air. Small nonspecific bilateral inguinal lymph nodes. Musculoskeletal: No destructive bony lesions are noted. No acute fractures. IMPRESSION: 1. There is small left pleural effusion with left lower lobe posterior atelectasis. 2. Again noted heterogeneous enhancement of the liver with mild nodular contour suspicious for cirrhosis. No focal hepatic mass. Mild perihepatic and perisplenic ascites. Mild ascites in right paracolic gutter. 3. Moderate pelvic ascites. 4. No small bowel obstruction. 5. Again noted chronic calcific pancreatitis and chronic dilatation of main pancreatic duct. 6. No  hydronephrosis or hydroureter. Mild delay excretion bilateral kidneys. 7. No colonic obstruction. There is residual mild thickening of distal colonic wall probable improving colitis. No evidence of pericolonic abscess or perforation. No extraluminal contrast material is noted. Electronically Signed   By: Lahoma Crocker M.D.   On: 08/29/2015 13:14   Ir Fluoro Guide Cv Line Right  08/26/2015  INDICATION: 60 year old with persistent fever and diarrhea. EXAM: FLUOROSCOPIC AND ULTRASOUND GUIDED PLACEMENT OF A CENTRAL VENOUS CATHETER Physician: Stephan Minister. Henn, MD FLUOROSCOPY TIME:  24 seconds, 1 mGy MEDICATIONS: None ANESTHESIA/SEDATION: None PROCEDURE: Informed consent was obtained for placement of a central venous catheter. The patient was placed supine on  the interventional table. Ultrasound confirmed a patent right internal jugularvein. Ultrasound images were obtained for documentation. The right side of the neck was prepped and draped in a sterile fashion. The right side of the neck was anesthetized with 1% lidocaine. Maximal barrier sterile technique was utilized including caps, mask, sterile gowns, sterile gloves, sterile drape, hand hygiene and skin antiseptic. A small incision was made with #11 blade scalpel. A 21 gauge needle directed into the right internal jugular vein with ultrasound guidance. A wire was advanced into the central venous system and a peel-away sheath was placed. A dual-lumen Power PICC line was cut to 13 cm. PICC line was advanced through the peel-away sheath and positioned at the superior cavoatrial junction. Both lumens aspirated and flushed well. Catheter was sutured to the neck. A dressing was placed. Fluoroscopic and ultrasound images were taken and saved for documentation. FINDINGS: Catheter tip at the superior cavoatrial junction. COMPLICATIONS: None IMPRESSION: Successful placement of a central venous catheter using ultrasound and fluoroscopic guidance. Electronically Signed   By: Markus Daft M.D.   On: 08/26/2015 18:03   Ir US Guide Vasc Access Left  09/01/2015  INDICATION: End-stage renal disease, occluded left upper arm AV graft EXAM: RADIOLOGY EXAMINATION; IR ULTRASOUND GUIDANCE VASC ACCESS LEFT MEDICATIONS: 1% lidocaine locally, 2 mg tPA intragraft, 0.5 mg Versed, 12.5 mcg fentanyl ANESTHESIA/SEDATION: Moderate Sedation Time:  30 The patient was continuously monitored during the procedure by the interventional radiology nurse under my direct supervision. FLUOROSCOPY TIME:  Fluoroscopy Time: 4 minutes 48 seconds (4 mGy). COMPLICATIONS: None immediate. PROCEDURE: Informed written consent was obtained from the patient after a thorough discussion of the procedural risks, benefits and alternatives. All questions were addressed.  Maximal Sterile Barrier Technique was utilized including caps, mask, sterile gowns, sterile gloves, sterile drape, hand hygiene and skin antiseptic. A timeout was performed prior to the initiation of the procedure. Under sterile conditions and local anesthesia, ultrasound micropuncture access performed of the left upper extremity AV graft. Access performed at 2 separate sites along the arterial side. Two 6 French sheaths inserted. Contrast injection confirms thrombosis of the access. Catheter and guidewire advanced across the venous outflow. Initial outflow venogram confirms patency of the central veins. Pull-back venogram confirms stenosis of venous anastomosis to the high brachial vein. Thrombotic occlusion of the graft evident. 2 mg tPA instilled for thrombo lysis. Mechanical thrombectomy performed throughout the graft with the tretola device. Graft inflow re-established by passing a 5.5 Fogarty catheter over a Glidewire across the arterial anastomosis 3 times. Both sheaths were back bled and syringe aspirated. Additional mechanical thrombectomy performed throughout the graft. Overlapping 7 mm angioplasty performed of the venous anastomosis. Two prolonged inflations were performed for approximately 45 seconds each to 20 atmospheres. Balloon was deflated and removed. Final shuntogram performed. Shuntogram: Following thrombolysis, mechanical thrombectomy, and 7  mm venous anastomotic angioplasty, the left upper arm AV graft is now patent. There is brisk graft flow throughout the venous limb into the outflow veins. No significant residual thrombus, stenosis or occlusion. Access sheaths removed. Hemostasis obtained with pursestring sutures. Sterile dressings applied. No immediate complication. Patient tolerated the procedure well. IMPRESSION: Successful left upper arm AV graft thrombolysis, thrombectomy, and 7 mm venous angioplasty to restore flow. Access ready for use. ACCESS: This access remains amenable to future  percutaneous interventions as clinically indicated. Electronically Signed   By: Jerilynn Mages.  Shick M.D.   On: 09/01/2015 13:52   Ir US Guide Vasc Access Right  08/26/2015  INDICATION: 60 year old with persistent fever and diarrhea. EXAM: FLUOROSCOPIC AND ULTRASOUND GUIDED PLACEMENT OF A CENTRAL VENOUS CATHETER Physician: Stephan Minister. Henn, MD FLUOROSCOPY TIME:  24 seconds, 1 mGy MEDICATIONS: None ANESTHESIA/SEDATION: None PROCEDURE: Informed consent was obtained for placement of a central venous catheter. The patient was placed supine on the interventional table. Ultrasound confirmed a patent right internal jugularvein. Ultrasound images were obtained for documentation. The right side of the neck was prepped and draped in a sterile fashion. The right side of the neck was anesthetized with 1% lidocaine. Maximal barrier sterile technique was utilized including caps, mask, sterile gowns, sterile gloves, sterile drape, hand hygiene and skin antiseptic. A small incision was made with #11 blade scalpel. A 21 gauge needle directed into the right internal jugular vein with ultrasound guidance. A wire was advanced into the central venous system and a peel-away sheath was placed. A dual-lumen Power PICC line was cut to 13 cm. PICC line was advanced through the peel-away sheath and positioned at the superior cavoatrial junction. Both lumens aspirated and flushed well. Catheter was sutured to the neck. A dressing was placed. Fluoroscopic and ultrasound images were taken and saved for documentation. FINDINGS: Catheter tip at the superior cavoatrial junction. COMPLICATIONS: None IMPRESSION: Successful placement of a central venous catheter using ultrasound and fluoroscopic guidance. Electronically Signed   By: Markus Daft M.D.   On: 08/26/2015 18:03   Ir Thrombectomy Av Fistula W/thrombolysis/pta Inc/shunt/img Left  09/01/2015  INDICATION: End-stage renal disease, occluded left upper arm AV graft EXAM: RADIOLOGY EXAMINATION; IR ULTRASOUND  GUIDANCE VASC ACCESS LEFT MEDICATIONS: 1% lidocaine locally, 2 mg tPA intragraft, 0.5 mg Versed, 12.5 mcg fentanyl ANESTHESIA/SEDATION: Moderate Sedation Time:  30 The patient was continuously monitored during the procedure by the interventional radiology nurse under my direct supervision. FLUOROSCOPY TIME:  Fluoroscopy Time: 4 minutes 48 seconds (4 mGy). COMPLICATIONS: None immediate. PROCEDURE: Informed written consent was obtained from the patient after a thorough discussion of the procedural risks, benefits and alternatives. All questions were addressed. Maximal Sterile Barrier Technique was utilized including caps, mask, sterile gowns, sterile gloves, sterile drape, hand hygiene and skin antiseptic. A timeout was performed prior to the initiation of the procedure. Under sterile conditions and local anesthesia, ultrasound micropuncture access performed of the left upper extremity AV graft. Access performed at 2 separate sites along the arterial side. Two 6 French sheaths inserted. Contrast injection confirms thrombosis of the access. Catheter and guidewire advanced across the venous outflow. Initial outflow venogram confirms patency of the central veins. Pull-back venogram confirms stenosis of venous anastomosis to the high brachial vein. Thrombotic occlusion of the graft evident. 2 mg tPA instilled for thrombo lysis. Mechanical thrombectomy performed throughout the graft with the tretola device. Graft inflow re-established by passing a 5.5 Fogarty catheter over a Glidewire across the arterial anastomosis 3 times. Both sheaths were  back bled and syringe aspirated. Additional mechanical thrombectomy performed throughout the graft. Overlapping 7 mm angioplasty performed of the venous anastomosis. Two prolonged inflations were performed for approximately 45 seconds each to 20 atmospheres. Balloon was deflated and removed. Final shuntogram performed. Shuntogram: Following thrombolysis, mechanical thrombectomy, and 7  mm venous anastomotic angioplasty, the left upper arm AV graft is now patent. There is brisk graft flow throughout the venous limb into the outflow veins. No significant residual thrombus, stenosis or occlusion. Access sheaths removed. Hemostasis obtained with pursestring sutures. Sterile dressings applied. No immediate complication. Patient tolerated the procedure well. IMPRESSION: Successful left upper arm AV graft thrombolysis, thrombectomy, and 7 mm venous angioplasty to restore flow. Access ready for use. ACCESS: This access remains amenable to future percutaneous interventions as clinically indicated. Electronically Signed   By: Jerilynn Mages.  Shick M.D.   On: 09/01/2015 13:52    Admission HPI: Mrs. Kiraly is a 60yo with SLE, ESRD from [facility] on MWF HD, HFpEF, alcoholic liver disease and chronic pancreatitis, as well as multiple recent admissions for recurrent C.diff colitis who presents with generalized weakness and lightheadedness that occurred near the end of her HD session today. She was in her usual state of health beforehand and even saw Dr. Baxter Flattery in ID without any symptoms earlier in the day. However, she felt somnolent, overall weak and lightheaded without other associated symptoms at the end of HD. Her CBG was checked which was 28 and recovered with mountain dew and D50. She does say she has good appetite, is eating well, denies fevers, malaise, vision changes, syncope, focal weakness/numbness, diaphoresis, chest pain, palpitations, shortness of breath, nausea, vomiting, or diarrhea, as well as any changes in urination. She says her diarrhea is much improved from her previous C.diff-related hospitalizations, now having 1-2 semi-solid brown BMs per day without blood or mucus. She has no other complaints at this time.  Hospital Course by problem list:  Advanced Care Planning: Ms. Sandford increasing hospitalizations and deteriorating condition throughout this hospital stay necessitated family meetings and  the consideration of code status and advanced care planning.  Discussions were begun with the family.  Ms. Glatter daughter, Isma Philp, was chosen by the extended family as the final decision-maker.  After Ms. Tancredi was unable to be weened from IV D10, the family decided to make her DNR status, and to allow hospice to become involved to keep her comfortable for her remaining days.  On 09/12/2015 she was made comfort care, all medications were discontinued, as well as hemodialysis and she was discharged to her SNF with hospice on 09/13/2015.  Pneumonia: CXR on admission was significant for possible left lower lobe infiltrate.  This was considered as a possible contributing factor/etiology of her hypoglycemia.  She was started on cefepime, which was discontinued when she was made DNR and placed on comfort care measures.   Hypoglycemia:  Ms. Schaben was given D10 and her blood glucose was sustained within normal values so long as she was maintained on IV D10.  Hypoglycemia was attributed to her decreasing liver function and inability to maintain blood glucose despite decreased PO intake.  IV access was lost at 2 times during her admission, and both periods without IV access were associated with decreasing blood glucose.  She was treated with buccal glucose until IV access was obtained, after which she was continued on D10 until she was discharged to her SNF with hospice care.    End Stage Renal Disease: Ms. Kinner received dialysis during her hospital  stay until she was made comfort care.  Hemodialysis was increasingly difficult to carry out due to access issues and agitation.    Chronic Illness: Ms. Yun was continued on her home medications until 09/12/2015 when she was made comfort care only.   Ms. Bastien was discharged from the hospital in frail condition on 09/20/2015 to return to her SNF with hospice care.     Discharge Vitals:   BP 99/65 mmHg  Pulse 55  Temp(Src) 97.8 F (36.6 C)  (Oral)  Resp 13  Ht 5\' 1"  (1.549 m)  Wt 72 lb 5 oz (32.8 kg)  BMI 13.67 kg/m2  SpO2 97% Physical Exam: General: Cachectic appearing woman, lethargic, resting in bed HEENT: mucus membranes moist  CV: RRR, 2/4 diastolic murmur  Ext: LUE AVF, good thrill and bruit. No peripheral edema.  Neurologic: Lethargic, unresponsive.   Discharge Labs:  Results for orders placed or performed during the hospital encounter of 09/07/15 (from the past 24 hour(s))  Glucose, capillary     Status: None   Collection Time: 09/12/15  5:14 PM  Result Value Ref Range   Glucose-Capillary 75 65 - 99 mg/dL   Comment 1 Capillary Specimen     Signed: Juliet Rude, MD 09/13/2015, 2:34 PM    Services Ordered on Discharge: Hospice Equipment Ordered on Discharge:  None

## 2015-09-13 NOTE — Clinical Social Work Note (Signed)
Patient to be discharged back to [facility] with hospice services following. Patient's daughter, [name], updated regarding discharge plan. Patient to be transported via EMS.  RN report number: [place], [place] [facility]: [phone] Surgical: [phone]

## 2015-09-13 NOTE — Consult Note (Signed)
   [facility] CM Inpatient Consult   09/13/2015  [name] [name] 1955/12/24 [id number] Patient has been active with [facility] for chronic disease management services.  Patient has been engaged by a [employer] and CSW.  Chart reveals patient and family are seeking residential Hospice.  Patient will receive full case management services under Hospice and will no longer have needs from [facility].  [facility] management will close case due to patient being enrolled under Hospice. If this changes please contact: Natividad Brood, RN BSN [facility] Liaison  [phone] business mobile phone Toll free office [phone]

## 2015-09-13 NOTE — Progress Notes (Signed)
Discharged via [facility]. 

## 2015-09-14 DIAGNOSIS — N031 Chronic nephritic syndrome with focal and segmental glomerular lesions: Secondary | ICD-10-CM | POA: Diagnosis not present

## 2015-09-14 DIAGNOSIS — N186 End stage renal disease: Secondary | ICD-10-CM | POA: Diagnosis not present

## 2015-09-14 DIAGNOSIS — Z992 Dependence on renal dialysis: Secondary | ICD-10-CM | POA: Diagnosis not present

## 2015-09-16 ENCOUNTER — Ambulatory Visit: Payer: Self-pay | Admitting: *Deleted

## 2015-10-04 DEATH — deceased

## 2015-10-07 ENCOUNTER — Ambulatory Visit: Payer: Self-pay | Admitting: Internal Medicine

## 2017-06-15 IMAGING — CR DG CHEST 1V PORT
1 series · 1 of 1 positions shown · non-contrast
Comparison: 12/06/2014

CLINICAL DATA: Patient is febrile with chills and vomiting.

EXAM:
PORTABLE CHEST 1 VIEW

[AP]
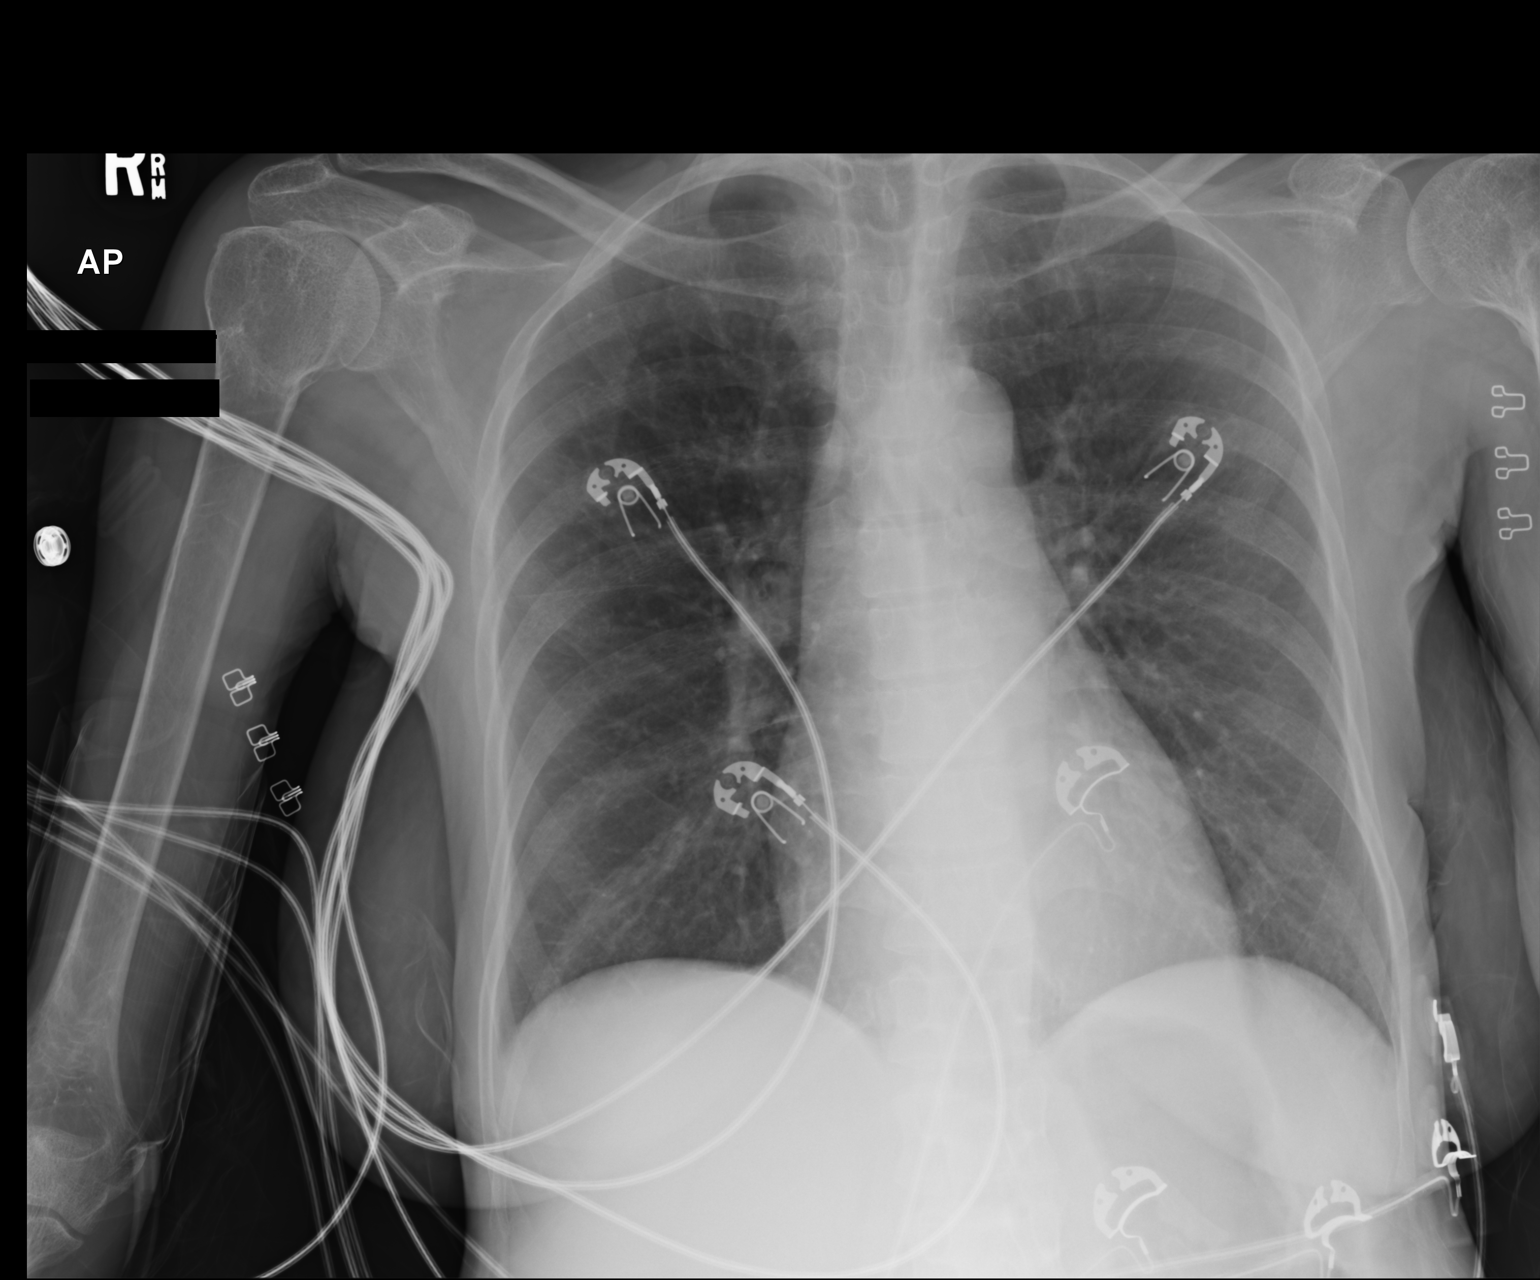

[1 of 1 positions shown; findings below may reference images not displayed]

FINDINGS: Cardiomediastinal silhouette is normal. Mediastinal contours appear
intact.

There is no evidence of focal airspace consolidation, pleural
effusion or pneumothorax.

Osseous structures are without acute abnormality. Soft tissues are
grossly normal.
IMPRESSION: No active disease.

## 2017-07-01 IMAGING — CT CT HEAD W/O CM
2 series · 16 of 30 positions shown, 20 images · non-contrast
Comparison: 05/09/2015

CLINICAL DATA: Seizure today.

EXAM:
CT HEAD WITHOUT CONTRAST
TECHNIQUE: Contiguous axial images were obtained from the base of the skull
through the vertex without intravenous contrast.

[Series 201: head w/o, idose (1) · axial · non-contrast · 0.41mm/px · z∈[+77,+197]mm · 13 of 28 slices shown, 17 images]
[im 2/28  brain]
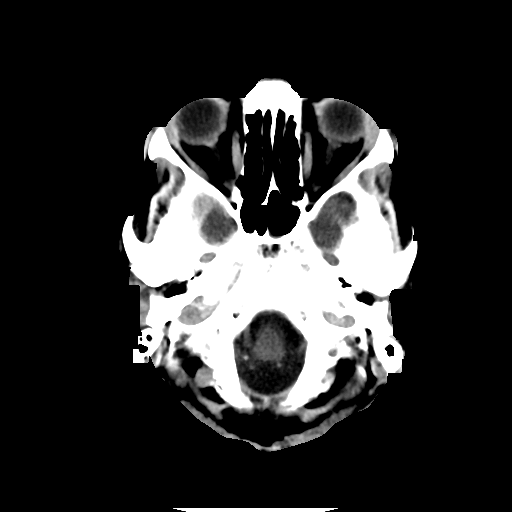
[im 2/28  bone]
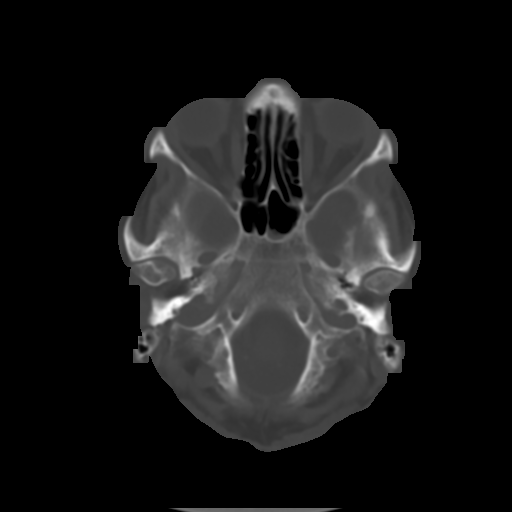
[im 4/28  brain]
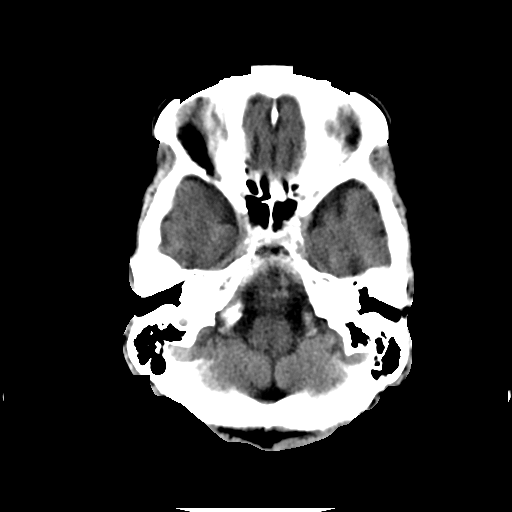
[im 6/28  brain]
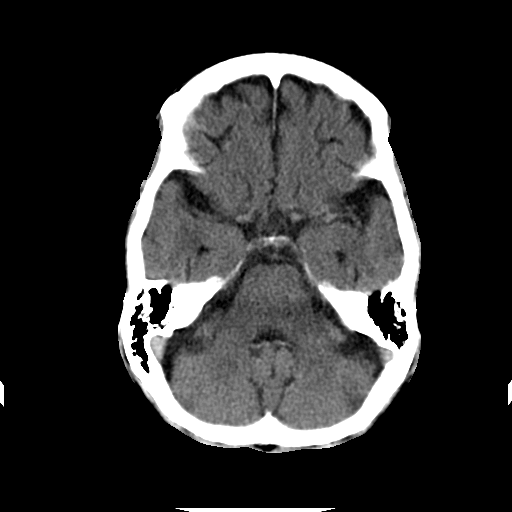
[im 8/28  brain]
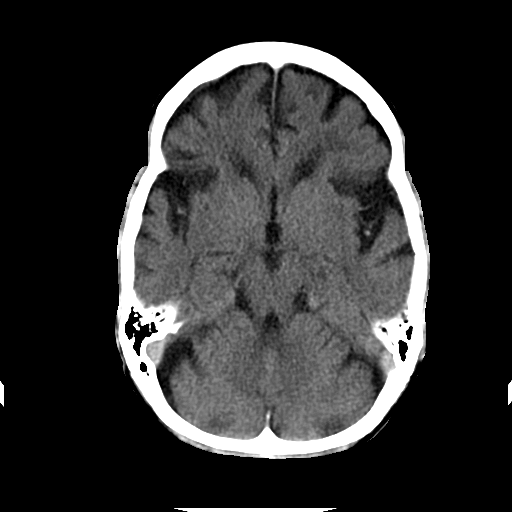
[im 10/28  brain]
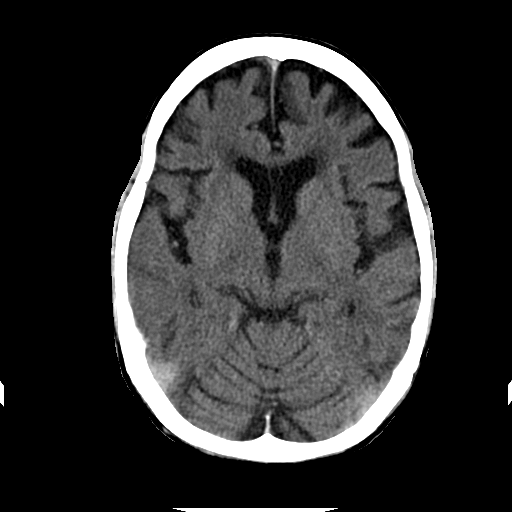
[im 10/28  bone]
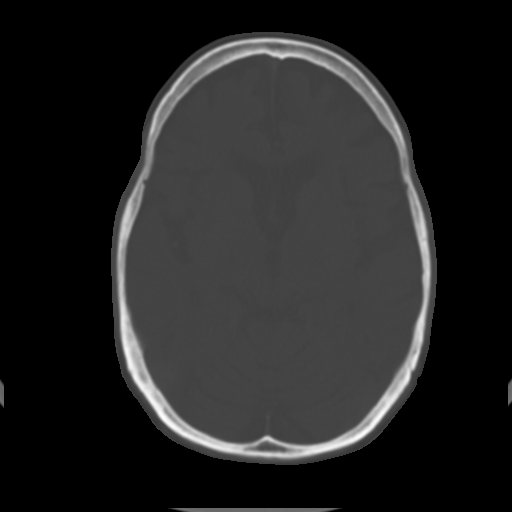
[im 12/28  brain]
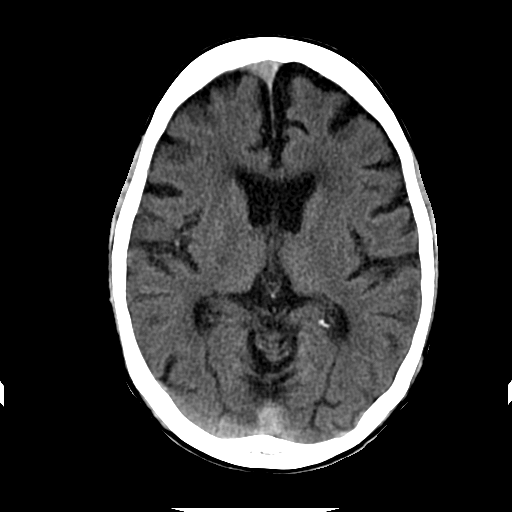
[im 14/28  brain]
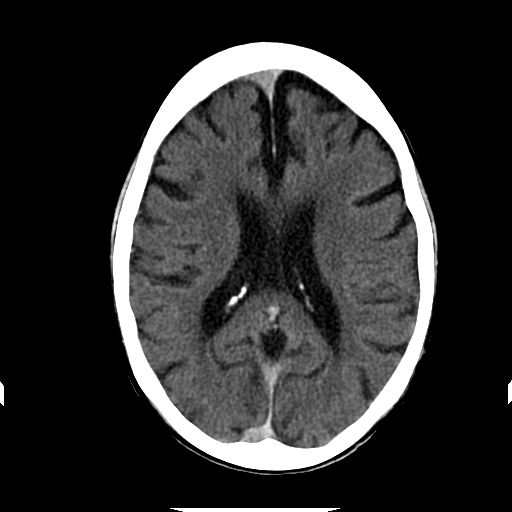
[im 16/28  brain]
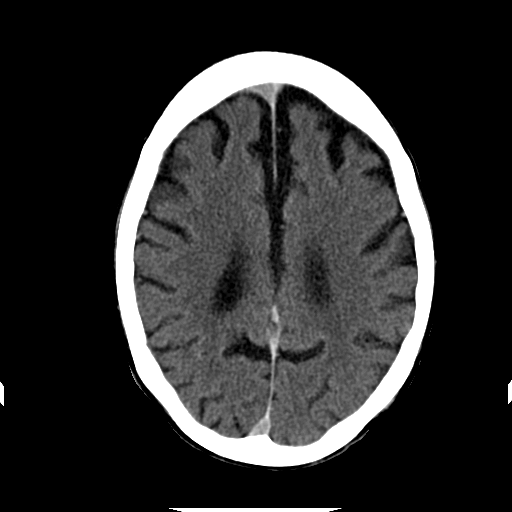
[im 18/28  brain]
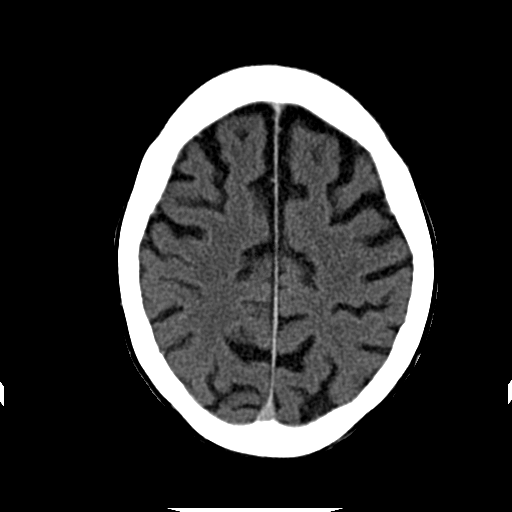
[im 18/28  bone]
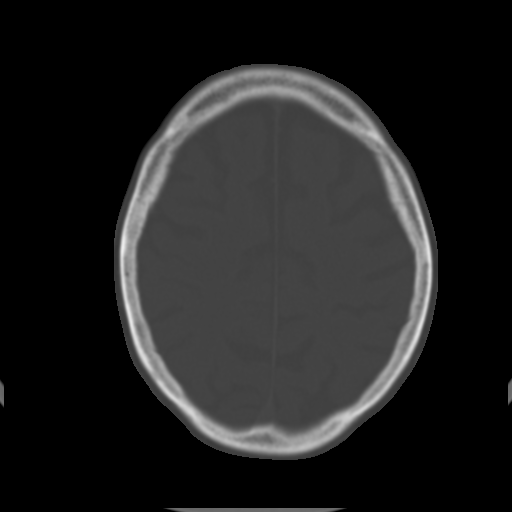
[im 20/28  brain]
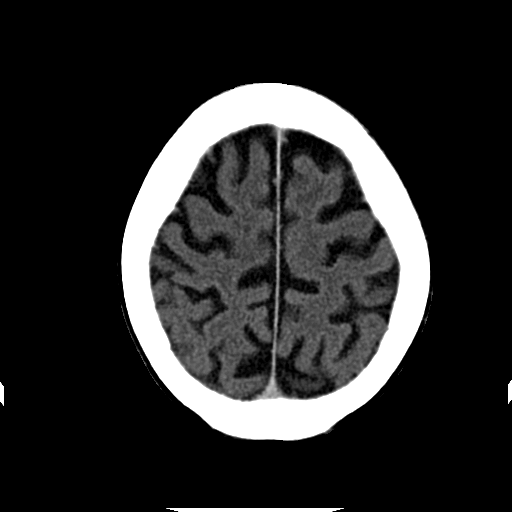
[im 22/28  brain]
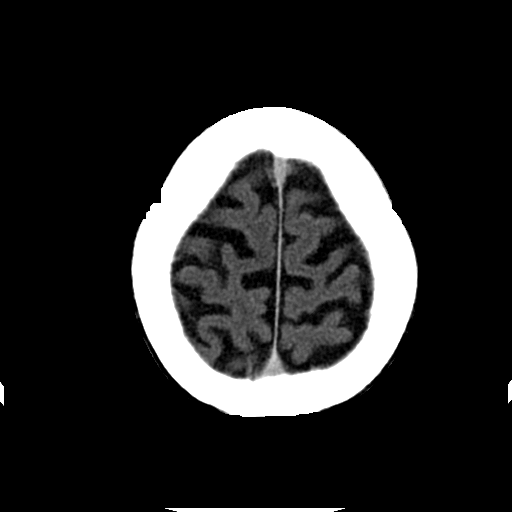
[im 24/28  brain]
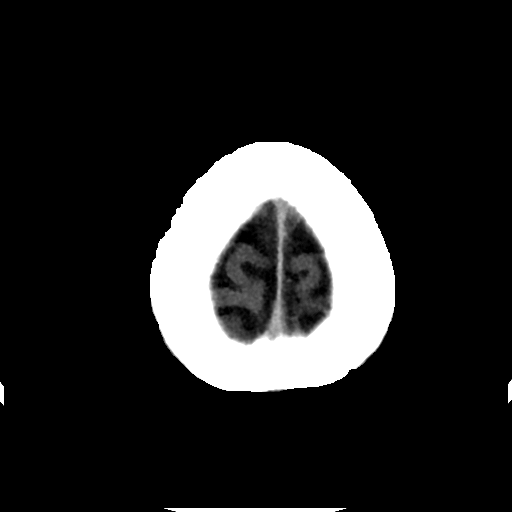
[im 26/28  brain]
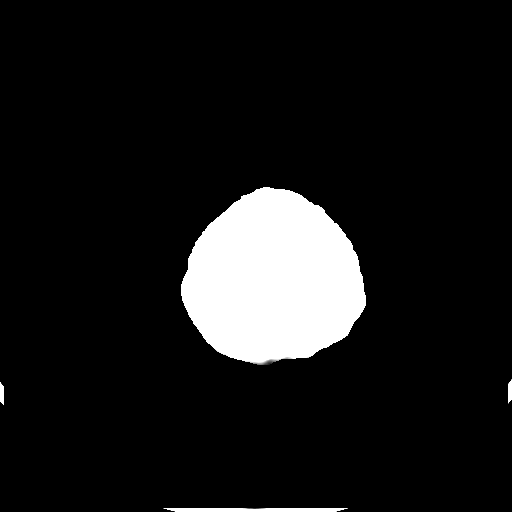
[im 26/28  bone]
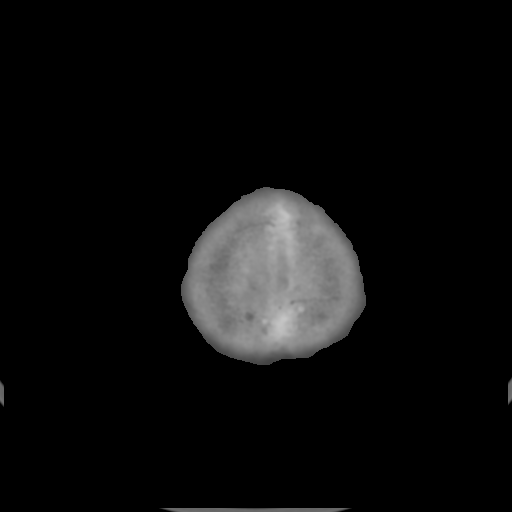

[Series 202: head w/o bone, idose (1) · axial · non-contrast · 0.41mm/px · z∈[+77,+117]mm · 3 of 28 slices shown]
[im 2/28  bone]
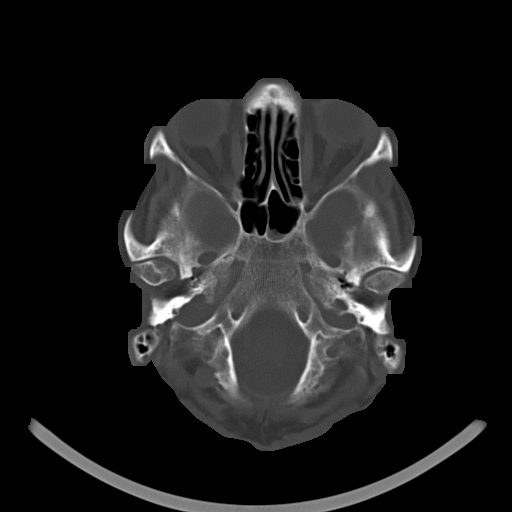
[im 6/28  bone]
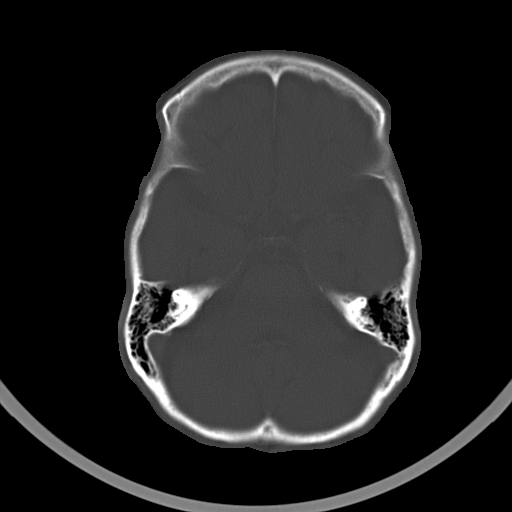
[im 10/28  bone]
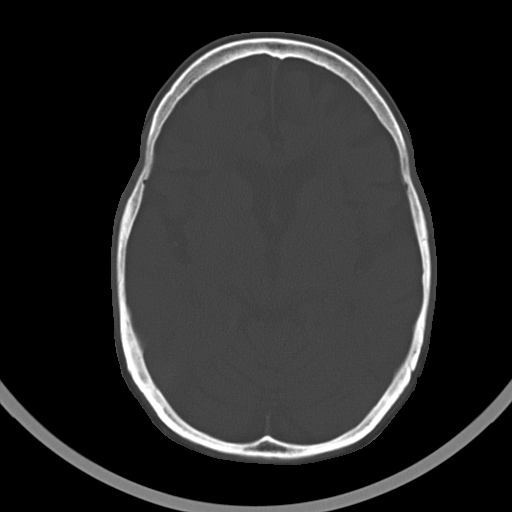

[16 of 30 positions shown; findings below may reference images not displayed]

FINDINGS: The brainstem, cerebellum, cerebral peduncles, thalami, basal
ganglia, basilar cisterns, and ventricular system appear within
normal limits. Periventricular white matter and corona radiata
hypodensities favor chronic ischemic microvascular white matter
disease. Mild cerebral atrophy for age.

No intracranial hemorrhage, mass lesion, or acute CVA.

Frothy material in the left sphenoid sinus. Aplastic frontal
sinuses.
IMPRESSION: 1. Persistent frothy material in the left sphenoid sinus favoring
chronic sinusitis.
2. No acute intracranial findings.
3. Periventricular white matter and corona radiata hypodensities
favor chronic ischemic microvascular white matter disease.
4. Mild cerebral atrophy for age.
